# Patient Record
Sex: Female | Born: 1945 | Race: White | Hispanic: No | Smoking: Never smoker
Health system: Southern US, Community
[De-identification: ages and names within clinical notes are randomized; demographics above are authoritative.]

## PROBLEM LIST (undated history)

## (undated) DIAGNOSIS — I209 Angina pectoris, unspecified: Secondary | ICD-10-CM

## (undated) DIAGNOSIS — D332 Benign neoplasm of brain, unspecified: Secondary | ICD-10-CM

## (undated) DIAGNOSIS — R519 Headache, unspecified: Secondary | ICD-10-CM

## (undated) DIAGNOSIS — IMO0002 Reserved for concepts with insufficient information to code with codable children: Secondary | ICD-10-CM

## (undated) DIAGNOSIS — M5136 Other intervertebral disc degeneration, lumbar region: Secondary | ICD-10-CM

## (undated) DIAGNOSIS — M8580 Other specified disorders of bone density and structure, unspecified site: Secondary | ICD-10-CM

## (undated) DIAGNOSIS — I10 Essential (primary) hypertension: Secondary | ICD-10-CM

## (undated) DIAGNOSIS — J45909 Unspecified asthma, uncomplicated: Secondary | ICD-10-CM

## (undated) DIAGNOSIS — Z950 Presence of cardiac pacemaker: Secondary | ICD-10-CM

## (undated) DIAGNOSIS — F32A Depression, unspecified: Secondary | ICD-10-CM

## (undated) DIAGNOSIS — I442 Atrioventricular block, complete: Secondary | ICD-10-CM

## (undated) DIAGNOSIS — I2510 Atherosclerotic heart disease of native coronary artery without angina pectoris: Secondary | ICD-10-CM

## (undated) DIAGNOSIS — R51 Headache: Secondary | ICD-10-CM

## (undated) DIAGNOSIS — E669 Obesity, unspecified: Secondary | ICD-10-CM

## (undated) DIAGNOSIS — G43909 Migraine, unspecified, not intractable, without status migrainosus: Secondary | ICD-10-CM

## (undated) DIAGNOSIS — R011 Cardiac murmur, unspecified: Secondary | ICD-10-CM

## (undated) DIAGNOSIS — M359 Systemic involvement of connective tissue, unspecified: Secondary | ICD-10-CM

## (undated) DIAGNOSIS — M1990 Unspecified osteoarthritis, unspecified site: Secondary | ICD-10-CM

## (undated) DIAGNOSIS — F419 Anxiety disorder, unspecified: Secondary | ICD-10-CM

## (undated) DIAGNOSIS — F329 Major depressive disorder, single episode, unspecified: Secondary | ICD-10-CM

## (undated) DIAGNOSIS — M810 Age-related osteoporosis without current pathological fracture: Secondary | ICD-10-CM

## (undated) DIAGNOSIS — I509 Heart failure, unspecified: Secondary | ICD-10-CM

## (undated) DIAGNOSIS — J189 Pneumonia, unspecified organism: Secondary | ICD-10-CM

## (undated) DIAGNOSIS — K219 Gastro-esophageal reflux disease without esophagitis: Secondary | ICD-10-CM

## (undated) DIAGNOSIS — F4310 Post-traumatic stress disorder, unspecified: Secondary | ICD-10-CM

## (undated) DIAGNOSIS — M51369 Other intervertebral disc degeneration, lumbar region without mention of lumbar back pain or lower extremity pain: Secondary | ICD-10-CM

## (undated) DIAGNOSIS — E7800 Pure hypercholesterolemia, unspecified: Secondary | ICD-10-CM

## (undated) DIAGNOSIS — J449 Chronic obstructive pulmonary disease, unspecified: Secondary | ICD-10-CM

## (undated) HISTORY — DX: Post-traumatic stress disorder, unspecified: F43.10

## (undated) HISTORY — PX: JOINT REPLACEMENT: SHX530

## (undated) HISTORY — PX: CHOLECYSTECTOMY: SHX55

## (undated) HISTORY — DX: Atrioventricular block, complete: I44.2

## (undated) HISTORY — PX: ABDOMINAL HYSTERECTOMY: SHX81

## (undated) HISTORY — DX: Presence of cardiac pacemaker: Z95.0

## (undated) HISTORY — PX: FOOT ARTHROPLASTY: SHX1657

## (undated) HISTORY — PX: CERVICAL FUSION: SHX112

---

## 2000-05-21 ENCOUNTER — Encounter: Payer: Self-pay | Admitting: Family Medicine

## 2000-05-21 ENCOUNTER — Encounter: Admission: RE | Admit: 2000-05-21 | Discharge: 2000-05-21 | Payer: Self-pay | Admitting: Family Medicine

## 2004-03-16 ENCOUNTER — Other Ambulatory Visit: Payer: Self-pay

## 2004-08-13 ENCOUNTER — Inpatient Hospital Stay: Payer: Self-pay | Admitting: Internal Medicine

## 2004-08-26 ENCOUNTER — Ambulatory Visit: Payer: Self-pay | Admitting: *Deleted

## 2004-09-20 ENCOUNTER — Ambulatory Visit: Payer: Self-pay | Admitting: Internal Medicine

## 2004-10-17 ENCOUNTER — Emergency Department: Payer: Self-pay | Admitting: Emergency Medicine

## 2005-01-12 ENCOUNTER — Emergency Department: Payer: Self-pay | Admitting: Emergency Medicine

## 2005-02-27 ENCOUNTER — Emergency Department: Payer: Self-pay | Admitting: Emergency Medicine

## 2005-05-01 ENCOUNTER — Emergency Department: Payer: Self-pay | Admitting: Emergency Medicine

## 2005-05-01 ENCOUNTER — Other Ambulatory Visit: Payer: Self-pay

## 2005-05-09 ENCOUNTER — Emergency Department: Payer: Self-pay | Admitting: Emergency Medicine

## 2005-05-09 ENCOUNTER — Other Ambulatory Visit: Payer: Self-pay

## 2005-05-18 ENCOUNTER — Ambulatory Visit: Payer: Self-pay | Admitting: Cardiovascular Disease

## 2005-05-22 ENCOUNTER — Ambulatory Visit: Payer: Self-pay | Admitting: Cardiovascular Disease

## 2005-07-16 ENCOUNTER — Ambulatory Visit: Payer: Self-pay

## 2005-09-02 ENCOUNTER — Emergency Department: Payer: Self-pay | Admitting: Emergency Medicine

## 2005-09-02 ENCOUNTER — Other Ambulatory Visit: Payer: Self-pay

## 2005-09-21 ENCOUNTER — Emergency Department: Payer: Self-pay | Admitting: General Practice

## 2005-11-10 ENCOUNTER — Ambulatory Visit: Payer: Self-pay

## 2005-12-18 ENCOUNTER — Emergency Department: Payer: Self-pay | Admitting: Emergency Medicine

## 2006-01-20 ENCOUNTER — Other Ambulatory Visit: Payer: Self-pay

## 2006-01-22 ENCOUNTER — Inpatient Hospital Stay: Payer: Self-pay

## 2006-02-17 ENCOUNTER — Inpatient Hospital Stay (HOSPITAL_COMMUNITY): Admission: RE | Admit: 2006-02-17 | Discharge: 2006-02-25 | Payer: Self-pay | Admitting: Neurosurgery

## 2006-02-18 ENCOUNTER — Encounter (INDEPENDENT_AMBULATORY_CARE_PROVIDER_SITE_OTHER): Payer: Self-pay | Admitting: *Deleted

## 2006-04-29 ENCOUNTER — Ambulatory Visit: Payer: Self-pay | Admitting: Neurosurgery

## 2006-06-21 ENCOUNTER — Emergency Department: Payer: Self-pay | Admitting: Emergency Medicine

## 2006-06-21 ENCOUNTER — Other Ambulatory Visit: Payer: Self-pay

## 2006-06-29 ENCOUNTER — Ambulatory Visit: Payer: Self-pay | Admitting: Internal Medicine

## 2006-06-30 ENCOUNTER — Ambulatory Visit: Payer: Self-pay | Admitting: Neurosurgery

## 2006-07-28 ENCOUNTER — Ambulatory Visit: Payer: Self-pay | Admitting: Family Medicine

## 2006-09-07 ENCOUNTER — Ambulatory Visit: Payer: Self-pay | Admitting: Pain Medicine

## 2006-09-25 IMAGING — CR CERVICAL SPINE - 2-3 VIEW
1 series · 5 of 5 positions shown · non-contrast
Comparison: none

REASON FOR EXAM: Injury
COMMENTS:  LMP: Post-Menopausal

[Series 1: view not recorded · 0.17mm/px · 5 of 5 slices shown]
[im 1/5]
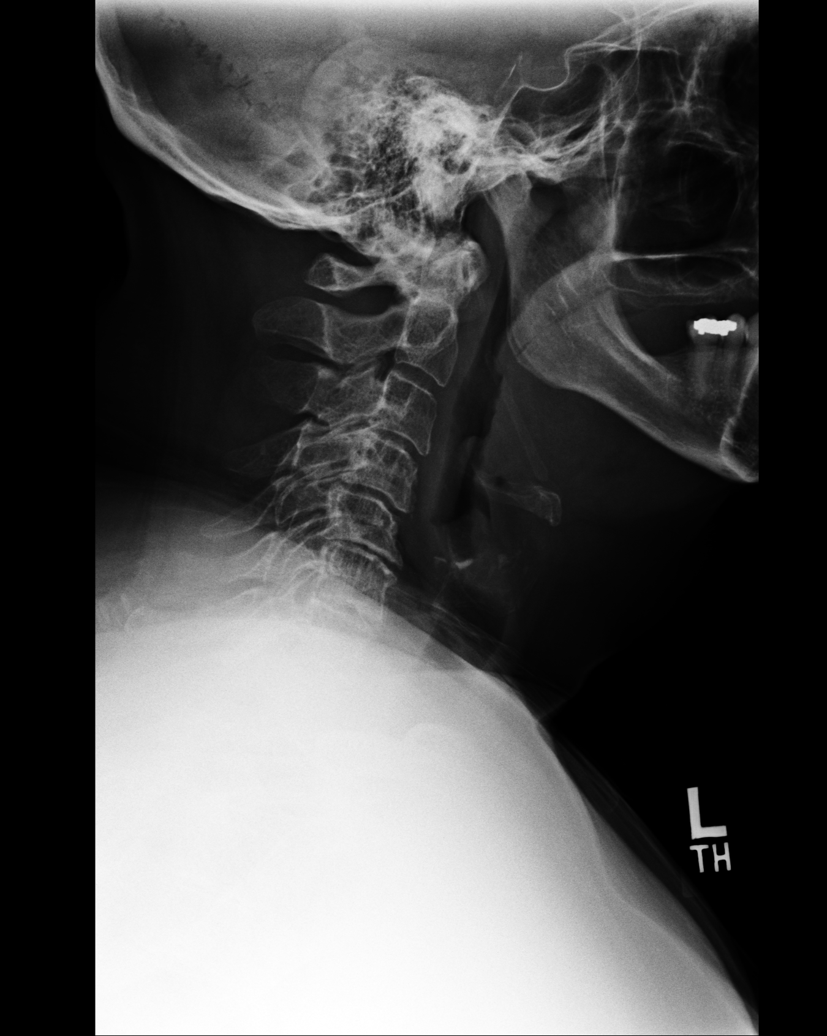
[im 2/5]
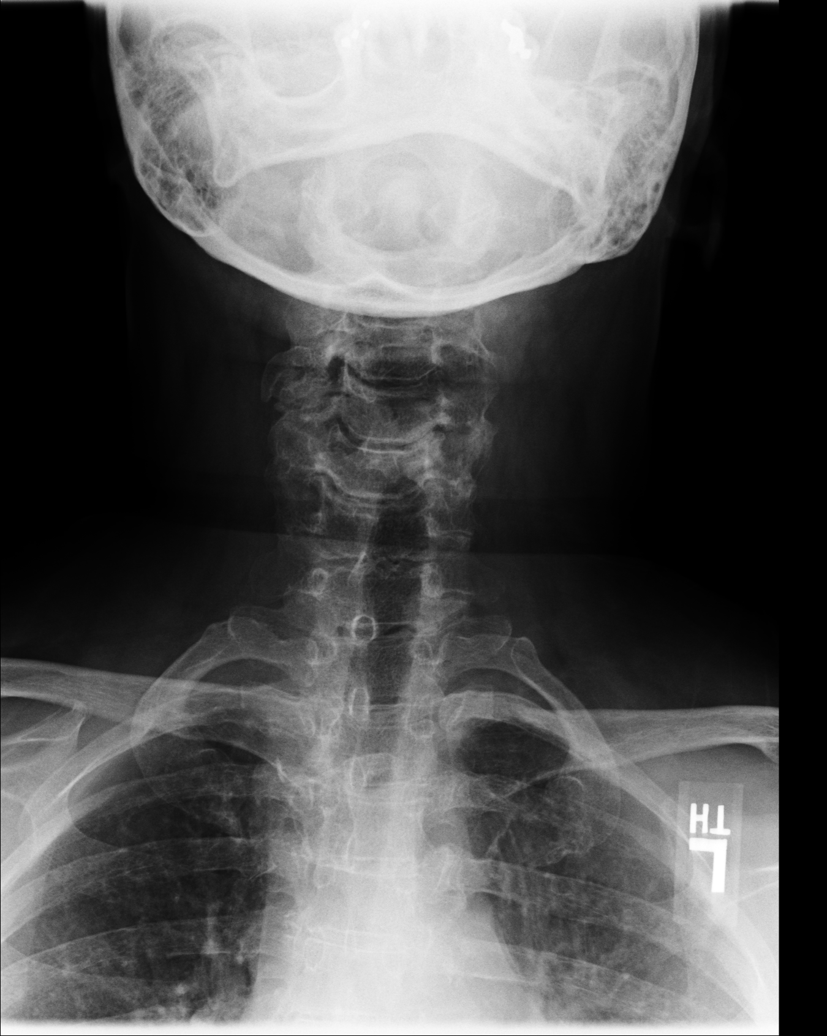
[im 3/5]
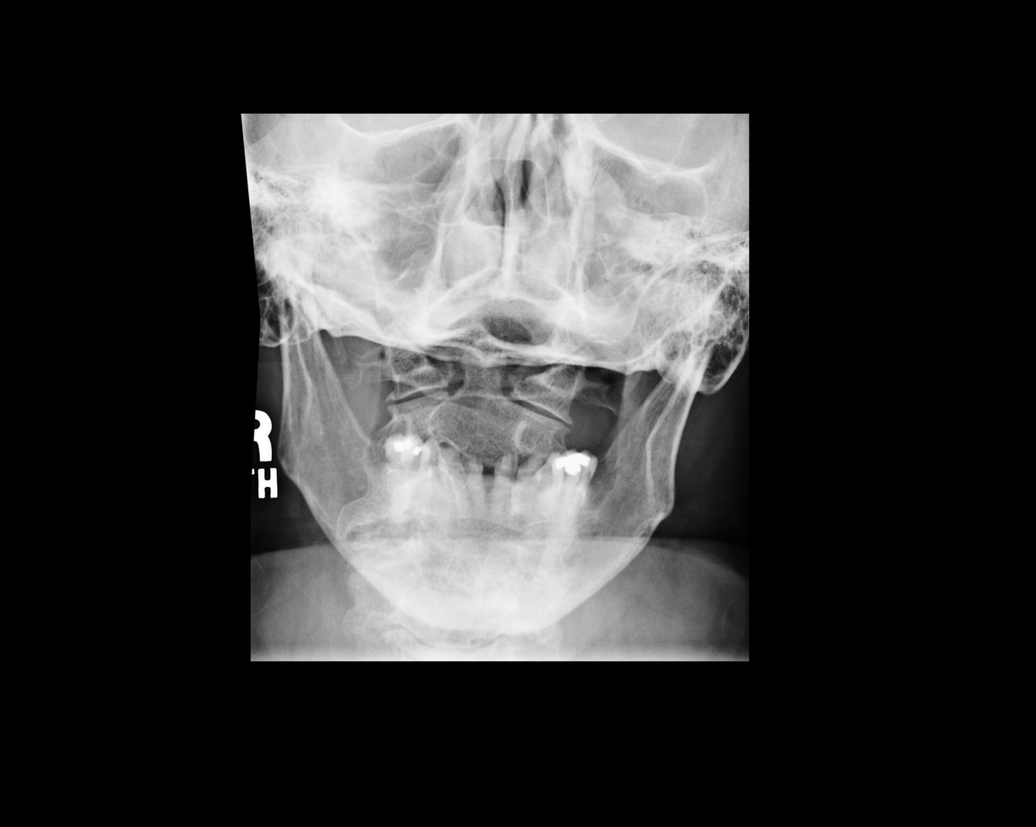
[im 4/5]
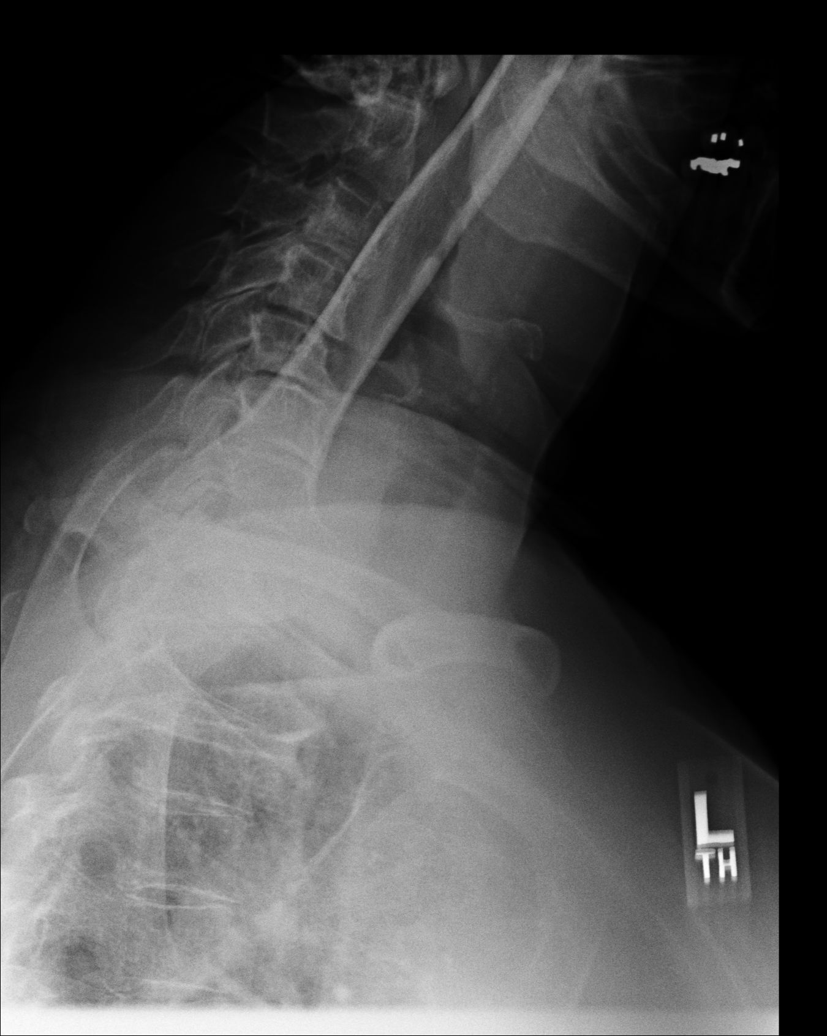
[im 5/5]
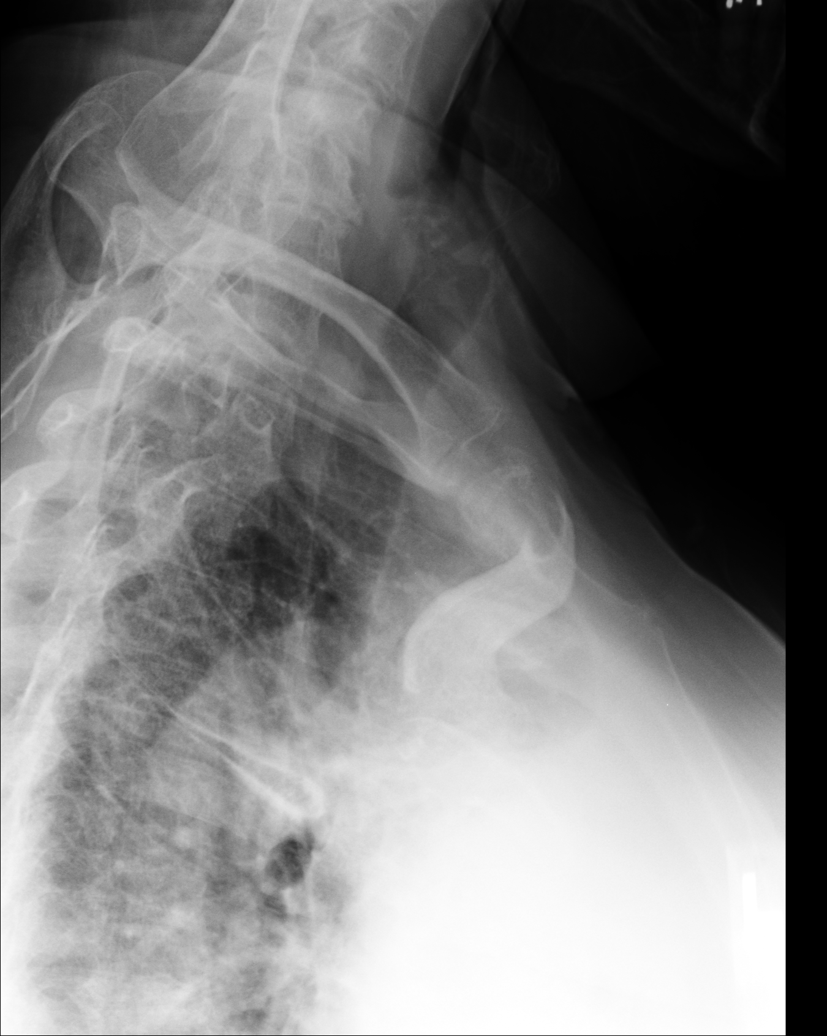

[5 of 5 positions shown; findings below may reference images not displayed]

PROCEDURE:     DXR - DXR C- SPINE AP AND LATERAL  - January 12, 2005  [DATE]

RESULT:     AP and lateral views of the cervical spine show the vertebral
body heights to be well maintained. No fracture is seen. The vertebral body
alignment is normal. There is narrowing of the C4-5, C5-6 and C6-7 cervical
disc spaces compatible with cervical disc disease. The odontoid process is
intact. No cervical rib formation is seen.
IMPRESSION: 1.     No fracture is noted.
2.     There are changes of cervical disc disease at multiple levels of the
lower cervical spine.

## 2006-10-06 ENCOUNTER — Ambulatory Visit: Payer: Self-pay | Admitting: Pain Medicine

## 2006-10-21 ENCOUNTER — Other Ambulatory Visit: Payer: Self-pay

## 2006-10-21 ENCOUNTER — Ambulatory Visit: Payer: Self-pay | Admitting: Specialist

## 2006-10-27 ENCOUNTER — Ambulatory Visit: Payer: Self-pay | Admitting: Pain Medicine

## 2006-11-02 ENCOUNTER — Ambulatory Visit: Payer: Self-pay | Admitting: Specialist

## 2006-11-10 IMAGING — US US EXTREM UP VENOUS*L*
1 series · 17 of 24 positions shown · non-contrast
Comparison: none

REASON FOR EXAM: Pain/rule out deep venous thrombosis
COMMENTS:

PROCEDURE:     US  - US DOPPLER UP EXTR LEFT  - February 27, 2005  [DATE]
RESULT:     LEFT lower extremity color-flow duplex Doppler reveals a small
popliteal fossa cyst consistent with Baker cyst measuring approximately
cm.  There is no evidence of deep venous thrombosis.

[Series 1: us extrem up venous*left* · 17 of 40 slices shown]
[im 1/40]
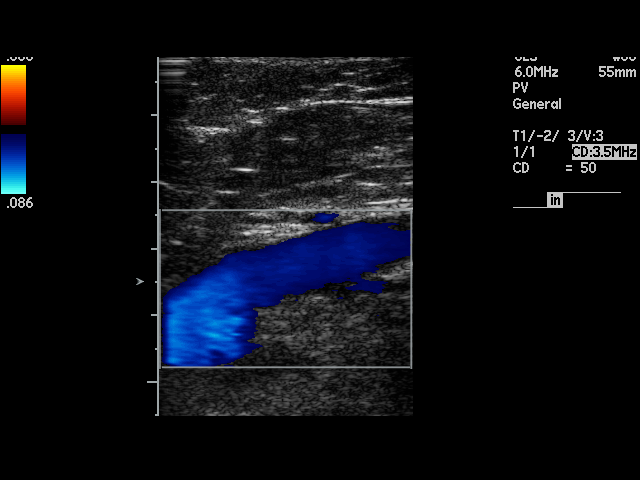
[im 4/40]
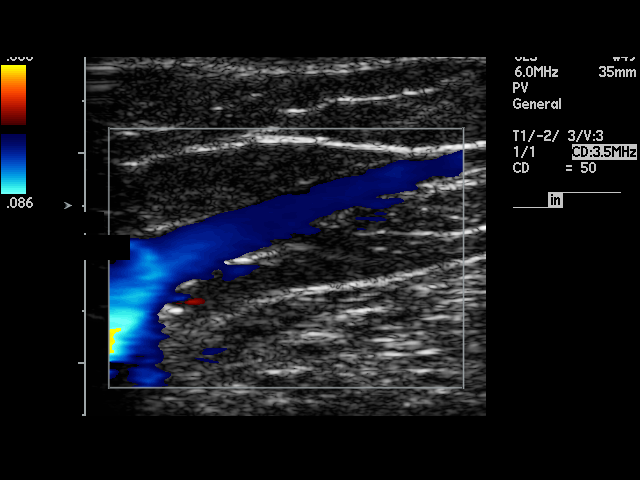
[im 6/40]
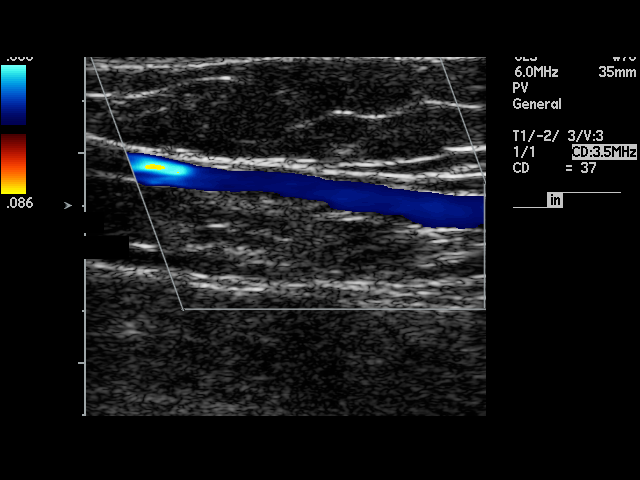
[im 7/40]
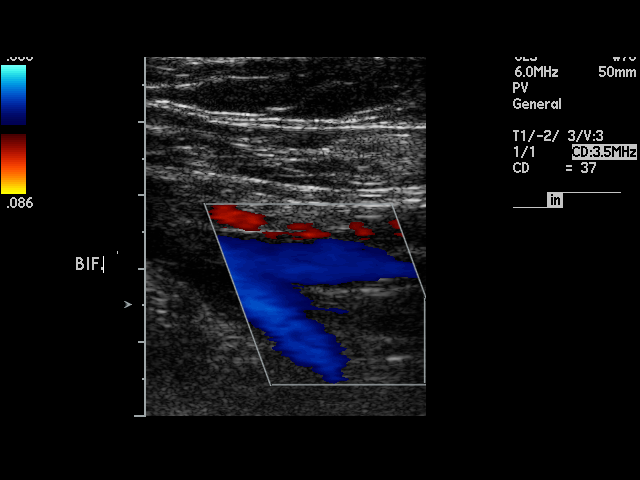
[im 11/40]
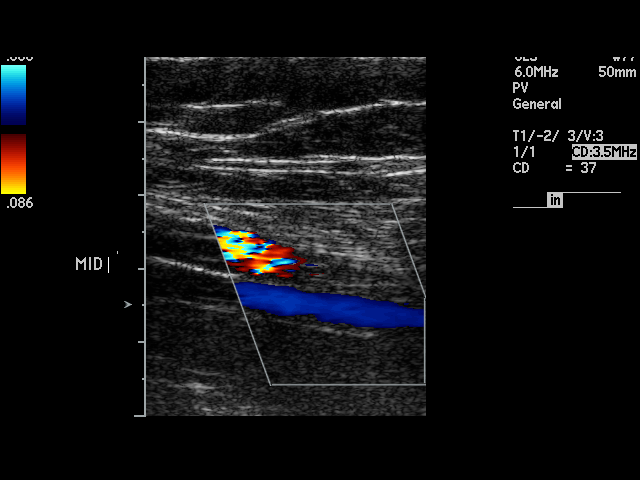
[im 12/40]
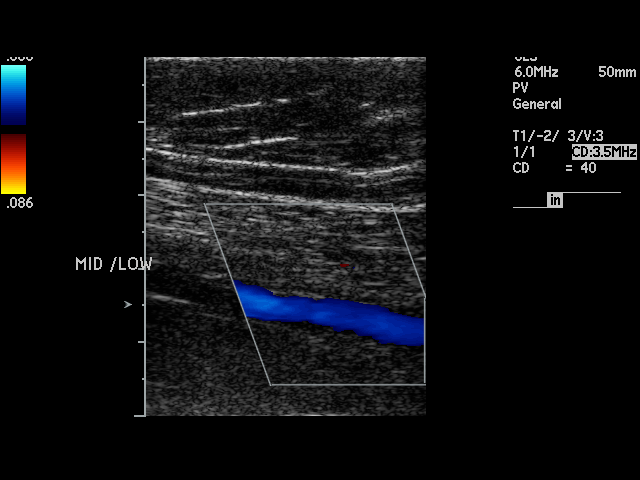
[im 16/40]
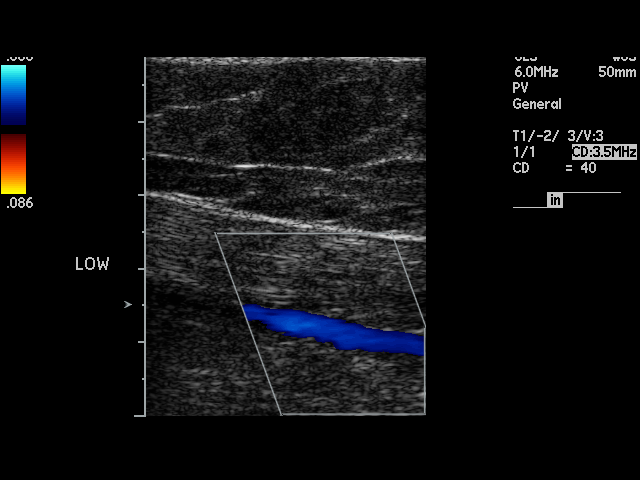
[im 17/40]
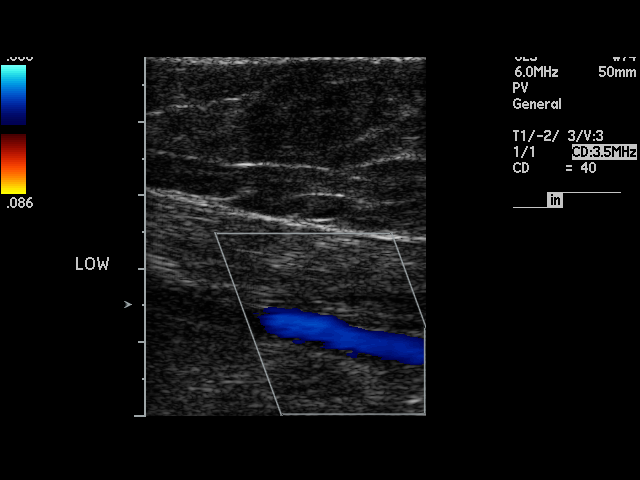
[im 21/40]
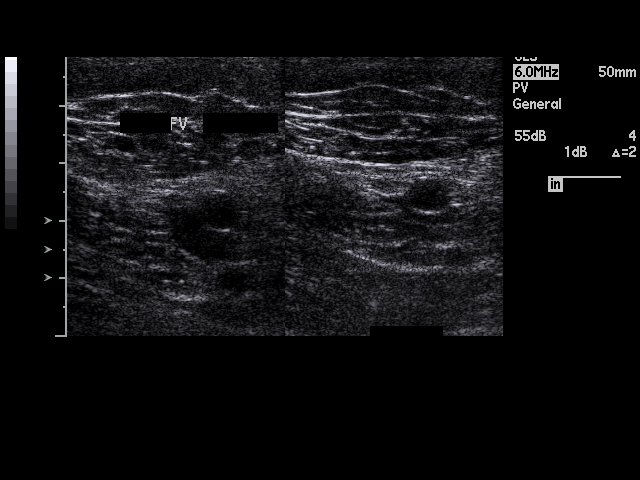
[im 23/40]
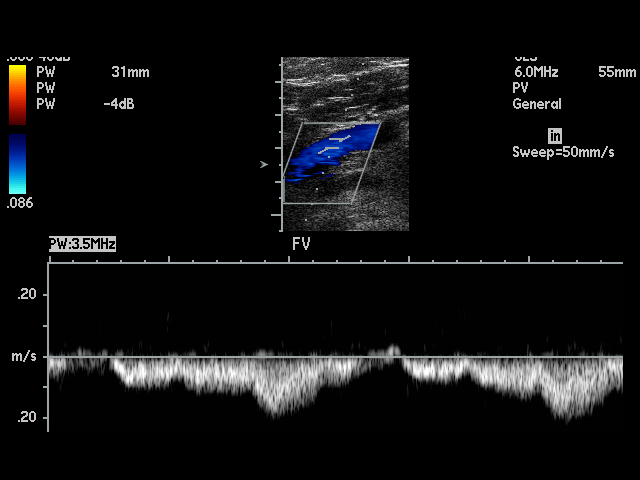
[im 24/40]
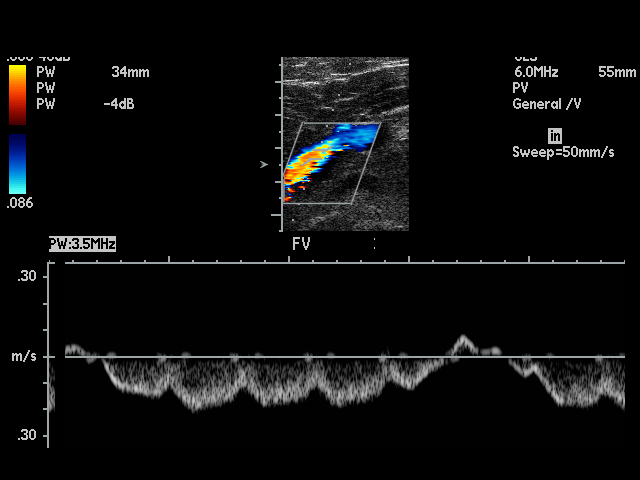
[im 28/40]
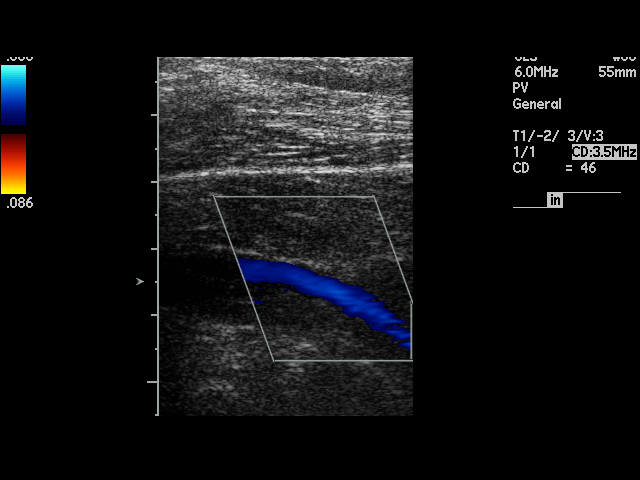
[im 29/40]
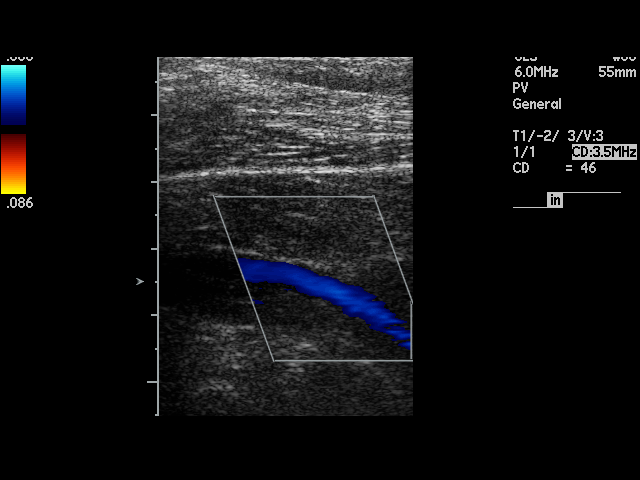
[im 33/40]
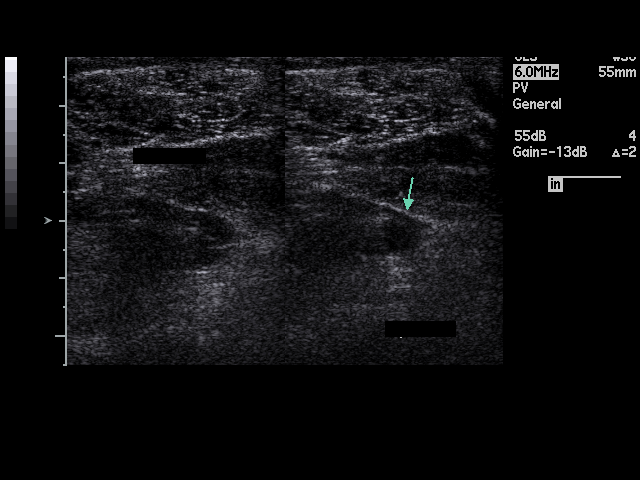
[im 34/40]
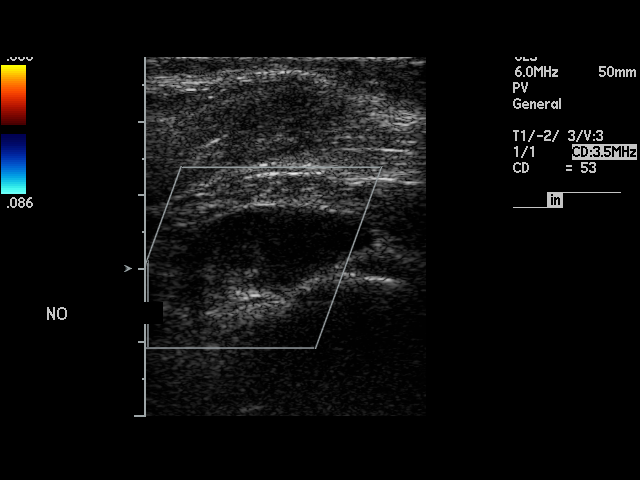
[im 36/40]
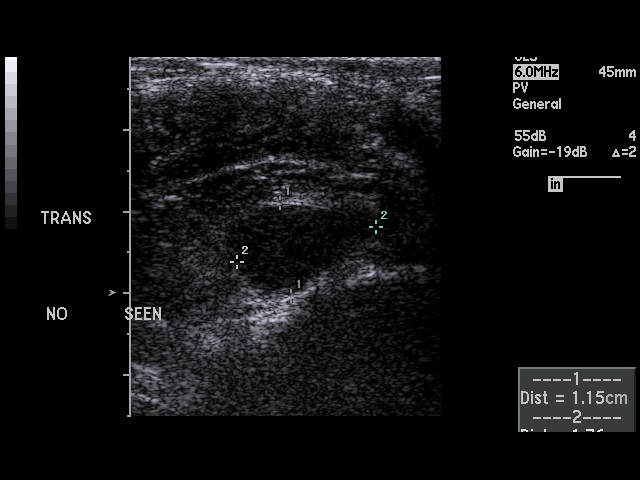
[im 40/40]
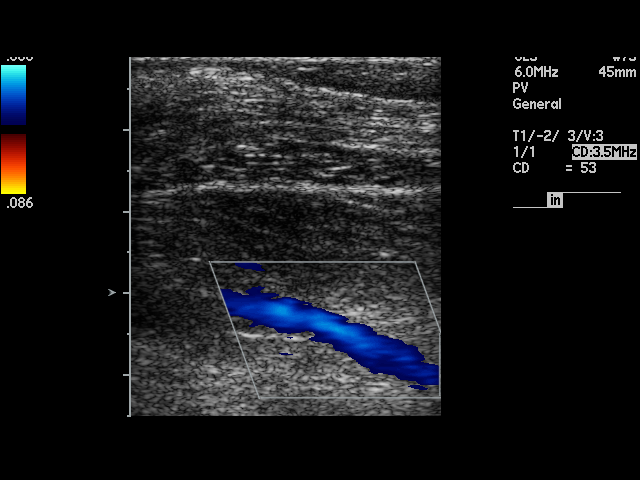

[17 of 24 positions shown; findings below may reference images not displayed]

IMPRESSION: 1.     A small Baker cyst, no evidence of deep venous thrombosis.
2.     The initial report was faxed to the emergency room at the time of the
study.

## 2006-11-17 ENCOUNTER — Emergency Department: Payer: Self-pay | Admitting: Emergency Medicine

## 2006-12-07 ENCOUNTER — Ambulatory Visit: Payer: Self-pay | Admitting: Pain Medicine

## 2006-12-13 ENCOUNTER — Ambulatory Visit: Payer: Self-pay | Admitting: Pain Medicine

## 2007-01-05 ENCOUNTER — Ambulatory Visit: Payer: Self-pay | Admitting: Pain Medicine

## 2007-01-12 IMAGING — CR DG CHEST 2V
1 series · 2 of 2 positions shown · non-contrast
Comparison: none

REASON FOR EXAM: Weakness
COMMENTS:

[Series 1481: postero_anterior · 0.11mm/px · 2 of 2 slices shown]
[im 1/2]
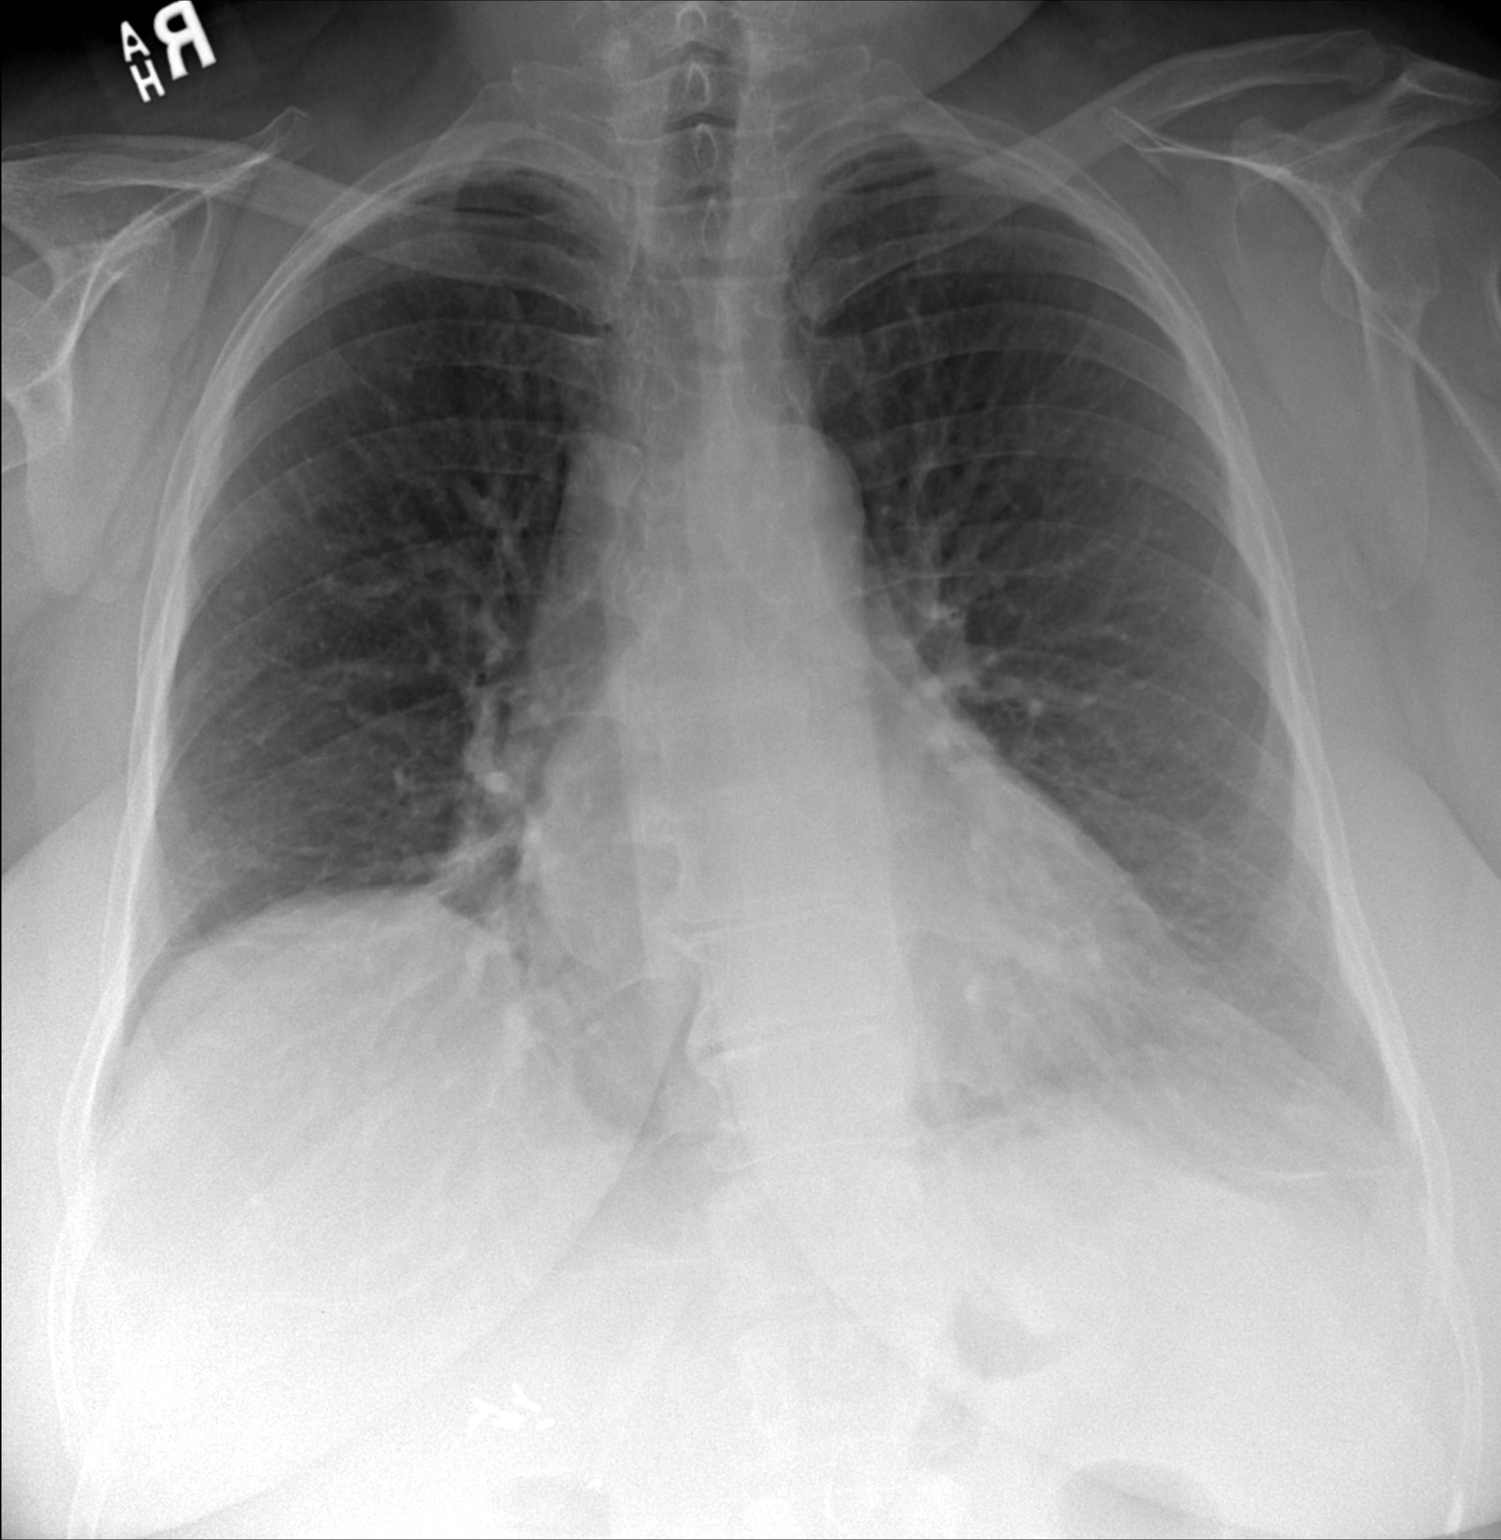
[im 2/2]
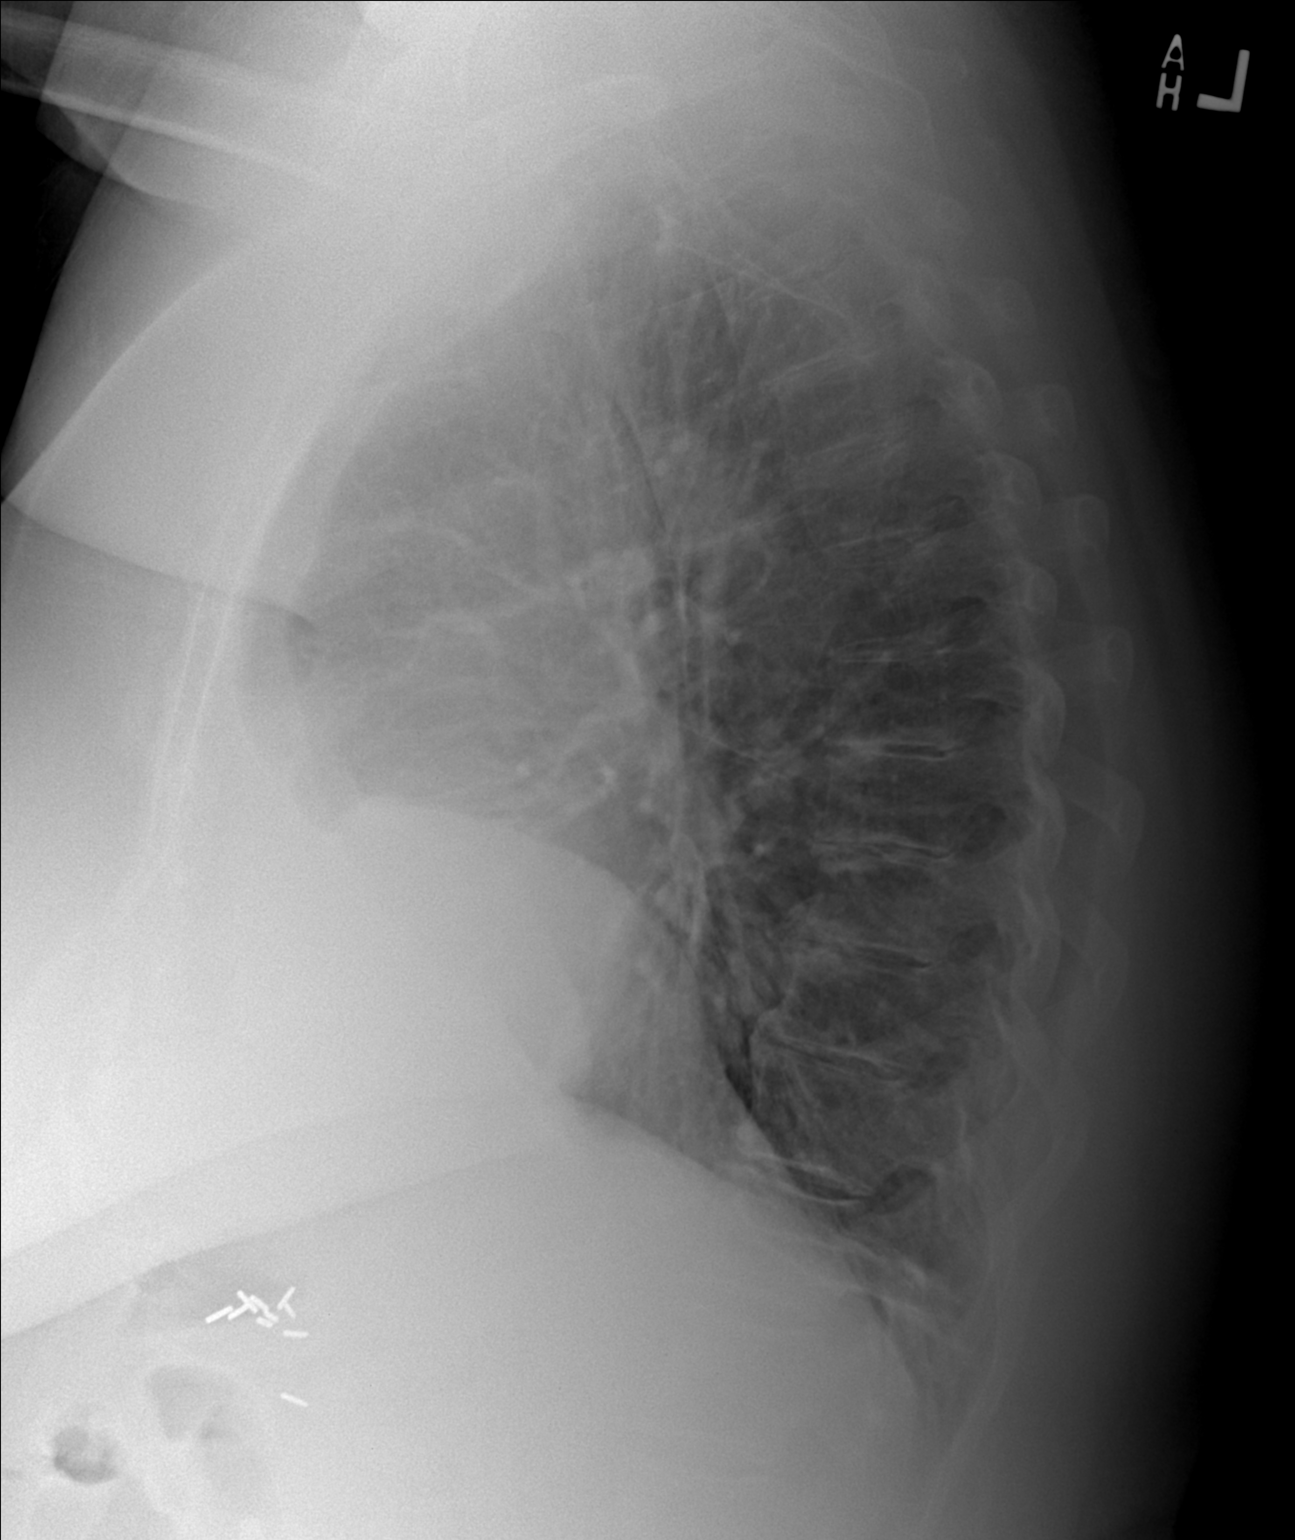

[2 of 2 positions shown; findings below may reference images not displayed]

PROCEDURE:     DXR - DXR CHEST PA (OR AP) AND LATERAL  - May 01, 2005  [DATE]

RESULT:     Comparison is made to a portable chest x-ray 13 August, 2004.

The LEFT lung is mildly hyperinflated.  Minimal atelectasis is noted in the
lung base.  On the RIGHT, the hemidiaphragm is elevated.  This is a stable
finding.  I see no focal infiltrate.  The heart is top normal in size.  I
see no pleural effusion.  There is mild prominence of the central pulmonary
vascularity.
IMPRESSION: There is mild prominence of the pulmonary vascularity
which may reflect an element of underlying low-grade congestive heart
failure.  This is superimposed upon findings of COPD.  I see no pleural
effusion currently.  Follow up films would be of value if the patient's
symptoms persist.

## 2007-01-20 ENCOUNTER — Ambulatory Visit: Payer: Self-pay | Admitting: Pain Medicine

## 2007-01-26 ENCOUNTER — Ambulatory Visit: Payer: Self-pay | Admitting: Pain Medicine

## 2007-02-09 ENCOUNTER — Ambulatory Visit: Payer: Self-pay | Admitting: Pain Medicine

## 2007-02-21 ENCOUNTER — Ambulatory Visit: Payer: Self-pay | Admitting: Pain Medicine

## 2007-02-28 ENCOUNTER — Ambulatory Visit: Payer: Self-pay | Admitting: Pain Medicine

## 2007-03-03 ENCOUNTER — Ambulatory Visit: Payer: Self-pay | Admitting: Internal Medicine

## 2007-03-23 ENCOUNTER — Ambulatory Visit: Payer: Self-pay | Admitting: Family Medicine

## 2007-03-29 ENCOUNTER — Ambulatory Visit: Payer: Self-pay | Admitting: Pain Medicine

## 2007-03-29 IMAGING — CT CT PARANASAL SINUSES W/O CM
1 series · 16 of 24 positions shown, 20 images · non-contrast
Comparison: none

REASON FOR EXAM: sinusitis
COMMENTS:

[Series 2: sinus · axial · 0.46mm/px · z∈[+286,+391]mm · 16 of 24 slices shown, 20 images]
[im 2/24  brain]
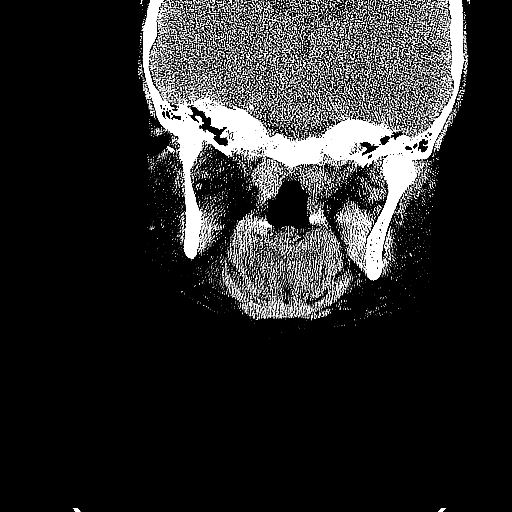
[im 2/24  bone]
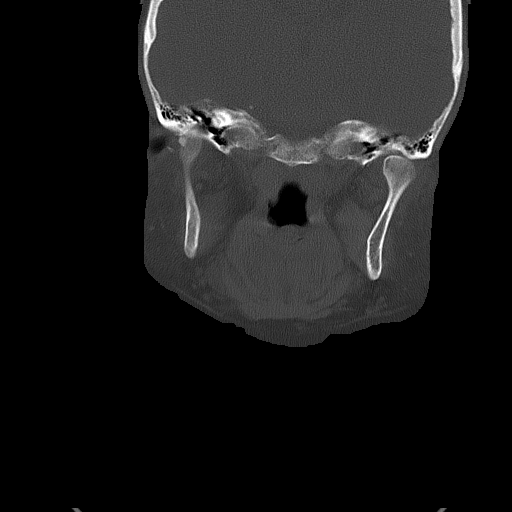
[im 4/24  bone]
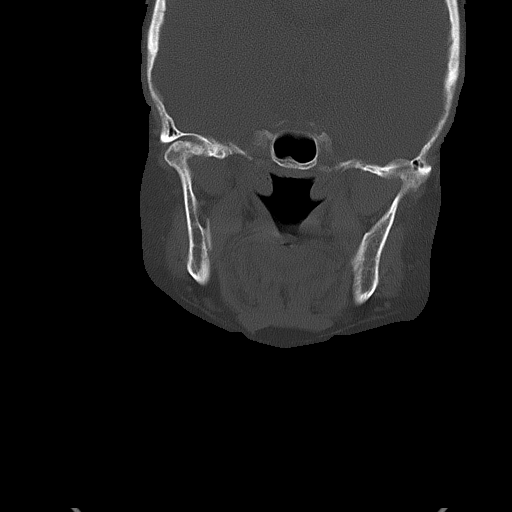
[im 5/24  bone]
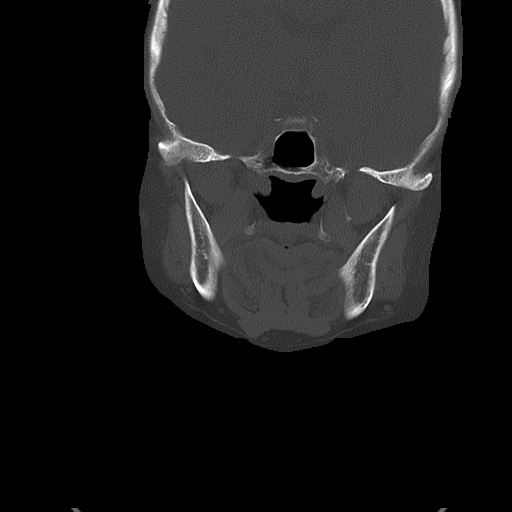
[im 6/24  bone]
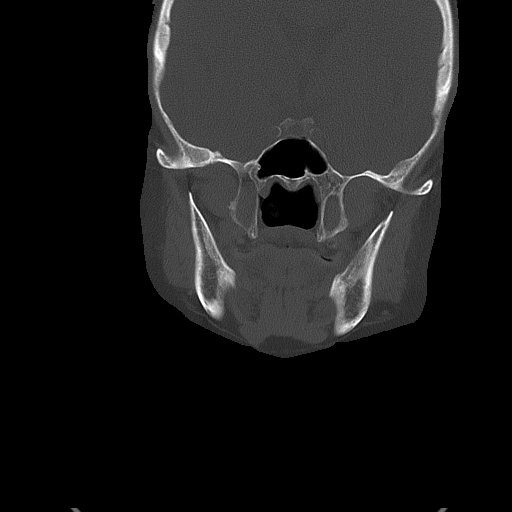
[im 8/24  brain]
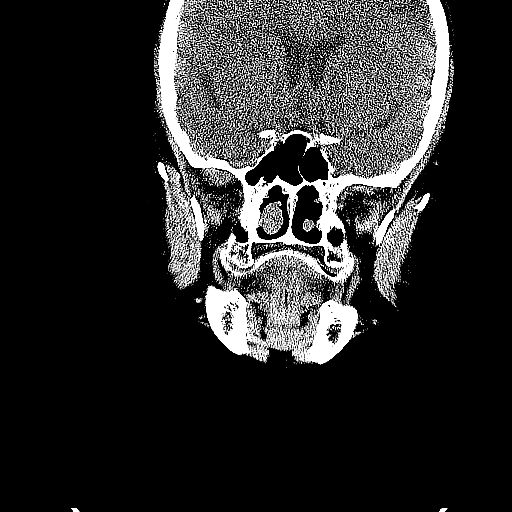
[im 8/24  bone]
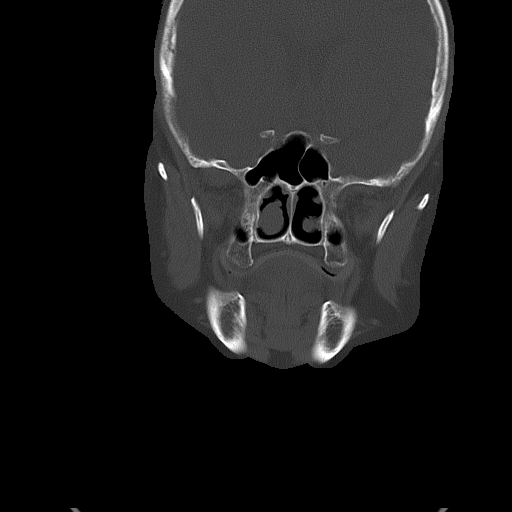
[im 9/24  bone]
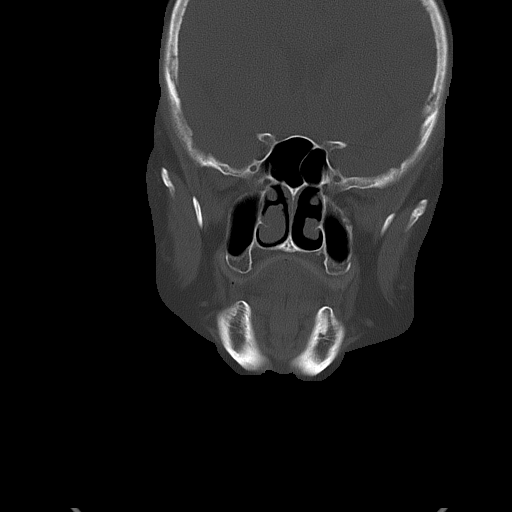
[im 10/24  bone]
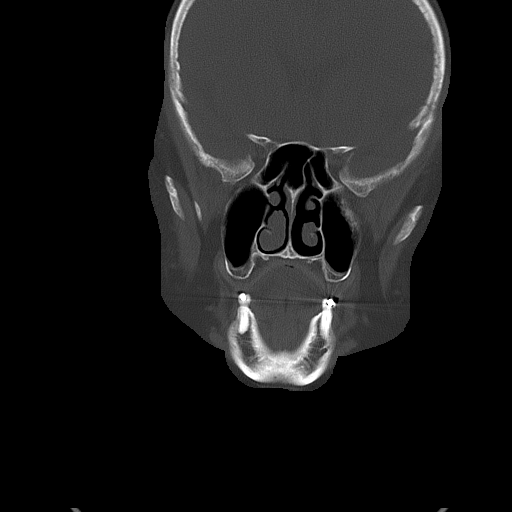
[im 12/24  bone]
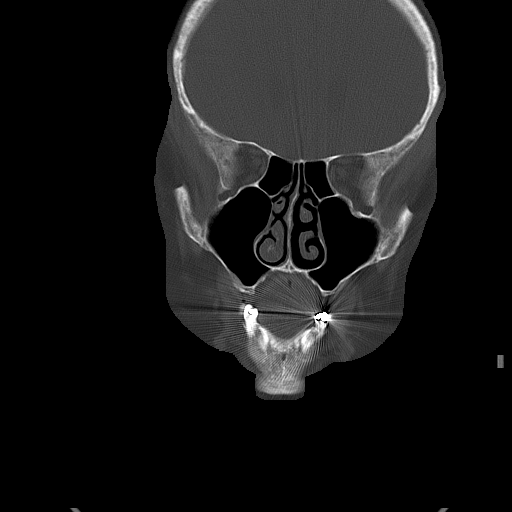
[im 13/24  brain]
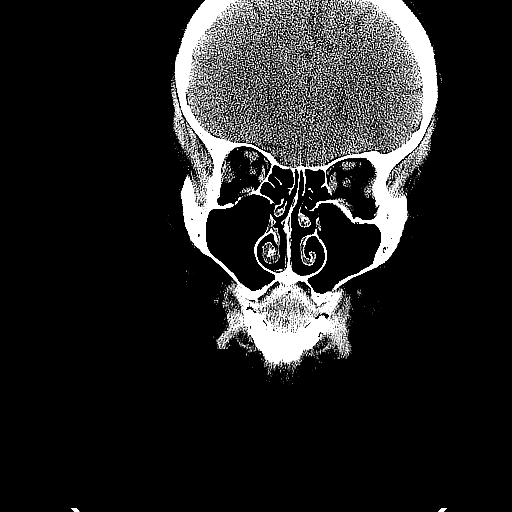
[im 13/24  bone]
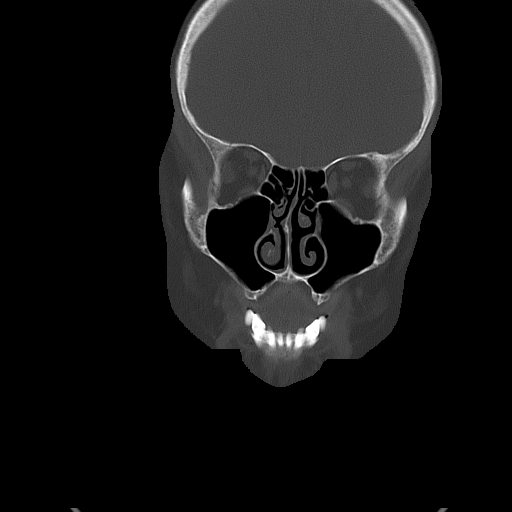
[im 15/24  bone]
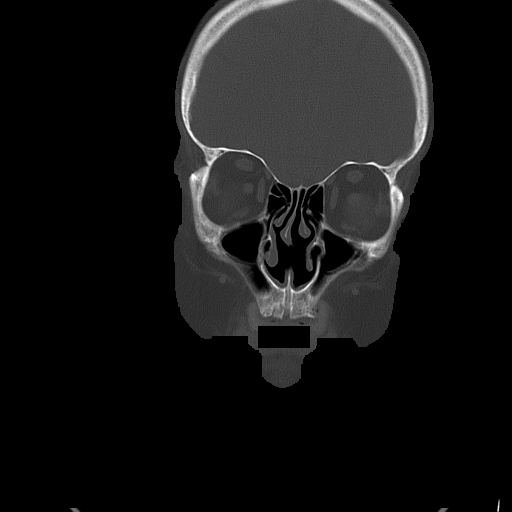
[im 16/24  bone]
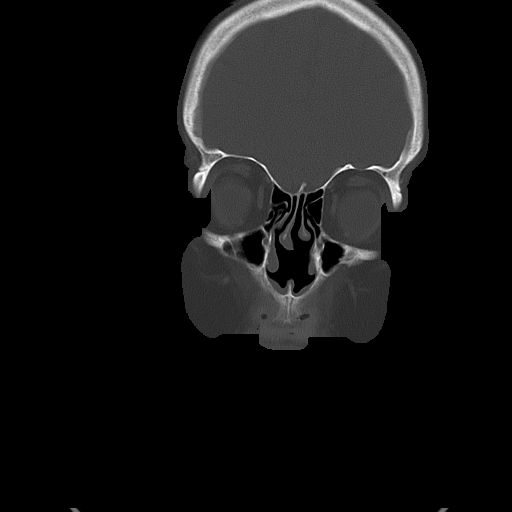
[im 17/24  bone]
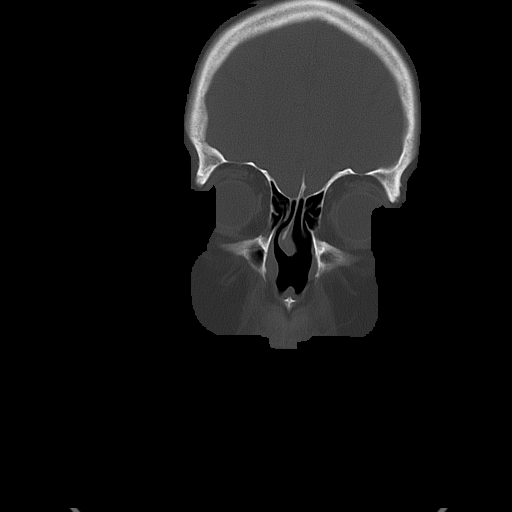
[im 19/24  brain]
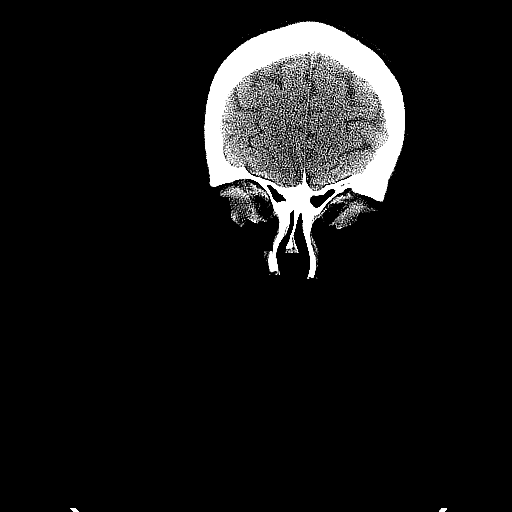
[im 19/24  bone]
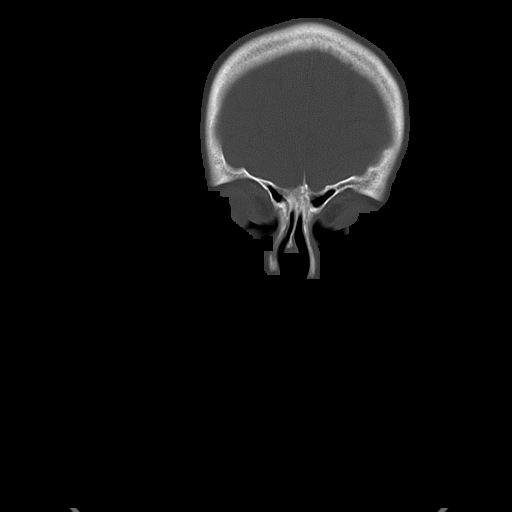
[im 20/24  bone]
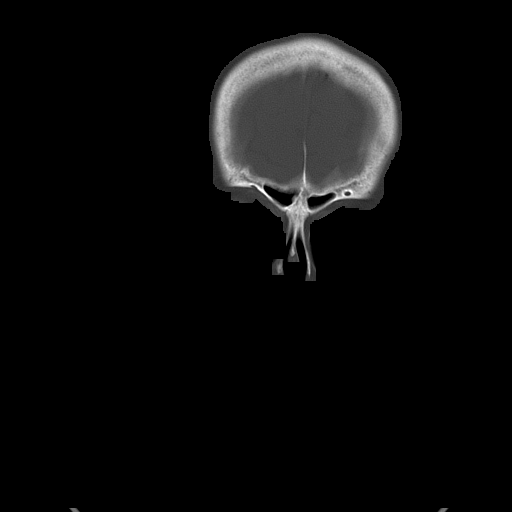
[im 21/24  bone]
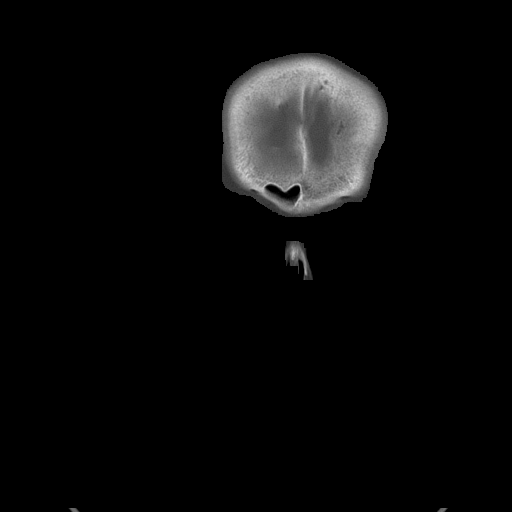
[im 23/24  bone]
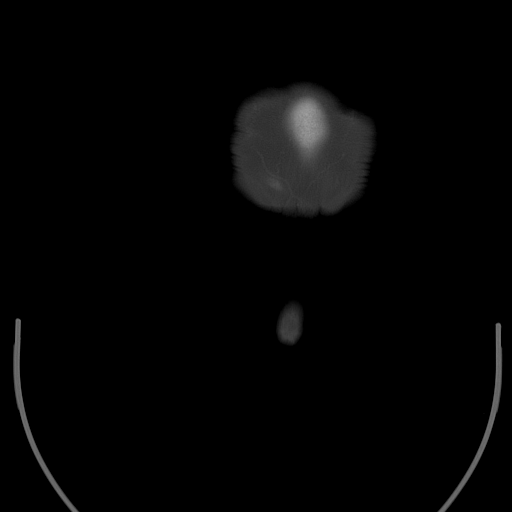

[16 of 24 positions shown; findings below may reference images not displayed]

PROCEDURE:     CT  - CT SINUSES WITHOUT CONTRAST  - July 16, 2005 [DATE]

RESULT:     Direct coronal CT imaging of the paranasal sinuses is performed.
The patient has no prior study for comparison.  The frontal sinuses are
hypoplastic but there is no evidence of mucosal thickening or air fluid
level.  The ethmoid, maxillary and sphenoid sinuses appear to be normally
aerated. No air fluid levels are seen.  The nasal septum approximates
midline, but curves slightly to the RIGHT.  Anteriorly, there is a cleft in
the nasal septum.  Does the patient report previous surgery? The ostium on
the RIGHT appears to be slightly narrowed but patent. The ostium on the LEFT
appears to be occluded by some mucosal thickening. No bony destruction is
seen.
IMPRESSION: 1)Please see above.

## 2007-04-04 ENCOUNTER — Ambulatory Visit: Payer: Self-pay | Admitting: Pain Medicine

## 2007-05-03 ENCOUNTER — Ambulatory Visit: Payer: Self-pay | Admitting: Pain Medicine

## 2007-05-10 ENCOUNTER — Other Ambulatory Visit: Payer: Self-pay

## 2007-05-10 ENCOUNTER — Emergency Department: Payer: Self-pay | Admitting: Emergency Medicine

## 2007-05-16 IMAGING — NM NM LUNG SCAN
2 series · 16 of 16 positions shown · non-contrast
Comparison: none

REASON FOR EXAM: Positive D-Dimer
COMMENTS:

[Series 1: lung ventilation · 3.90mm/px · 4 acquisitions, 8 frames shown]
[im 1/4]
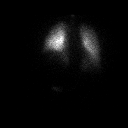
[im 1/4]
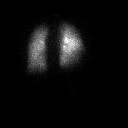
[im 2/4]
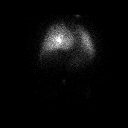
[im 2/4]
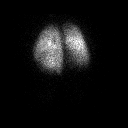
[im 3/4]
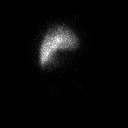
[im 3/4]
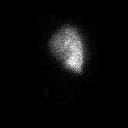
[im 4/4]
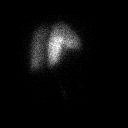
[im 4/4]
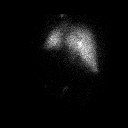

[Series 1: lung perfusion · 1.95mm/px · 4 acquisitions, 8 frames shown]
[im 1/4]
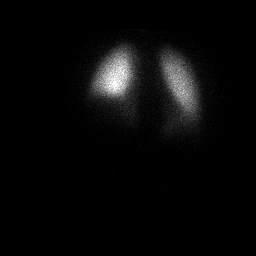
[im 1/4]
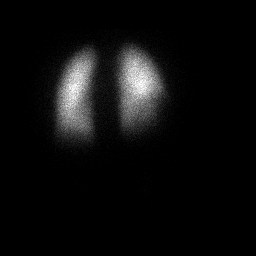
[im 2/4]
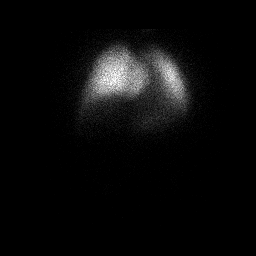
[im 2/4]
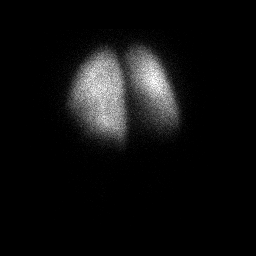
[im 3/4]
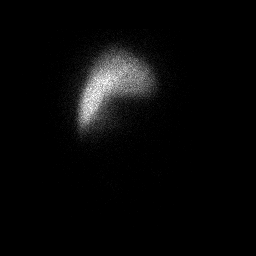
[im 3/4]
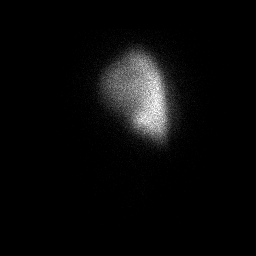
[im 4/4]
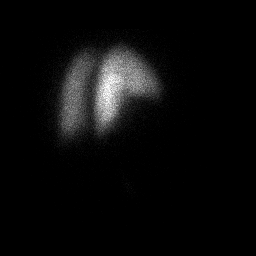
[im 4/4]
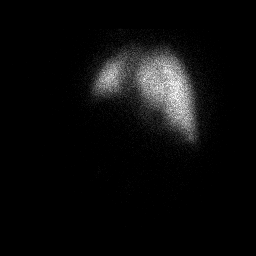

[16 of 16 positions shown; findings below may reference images not displayed]

PROCEDURE:     NM  - NM VQ LUNG SCAN  - [DATE] [DATE] [DATE] [DATE]

RESULT:     Emergent exam was performed for positive D-Dimer.

The scan was originally read by the [HOSPITAL].

The ventilation scan was first performed and no defects are identified.
Perfusion scan was then performed and no mismatched defects are seen. There
is fairly uniform distribution of tracer throughout the lung fields.
IMPRESSION: Low probability for pulmonary embolus. No mismatched defects
are noted.

## 2007-05-17 IMAGING — CR DG CHEST 2V
1 series · 2 of 2 positions shown · non-contrast
Comparison: none

REASON FOR EXAM: Positive D-Dimer
COMMENTS:

[Series 1: view not recorded · 0.17mm/px · 2 of 2 slices shown]
[im 1/2]
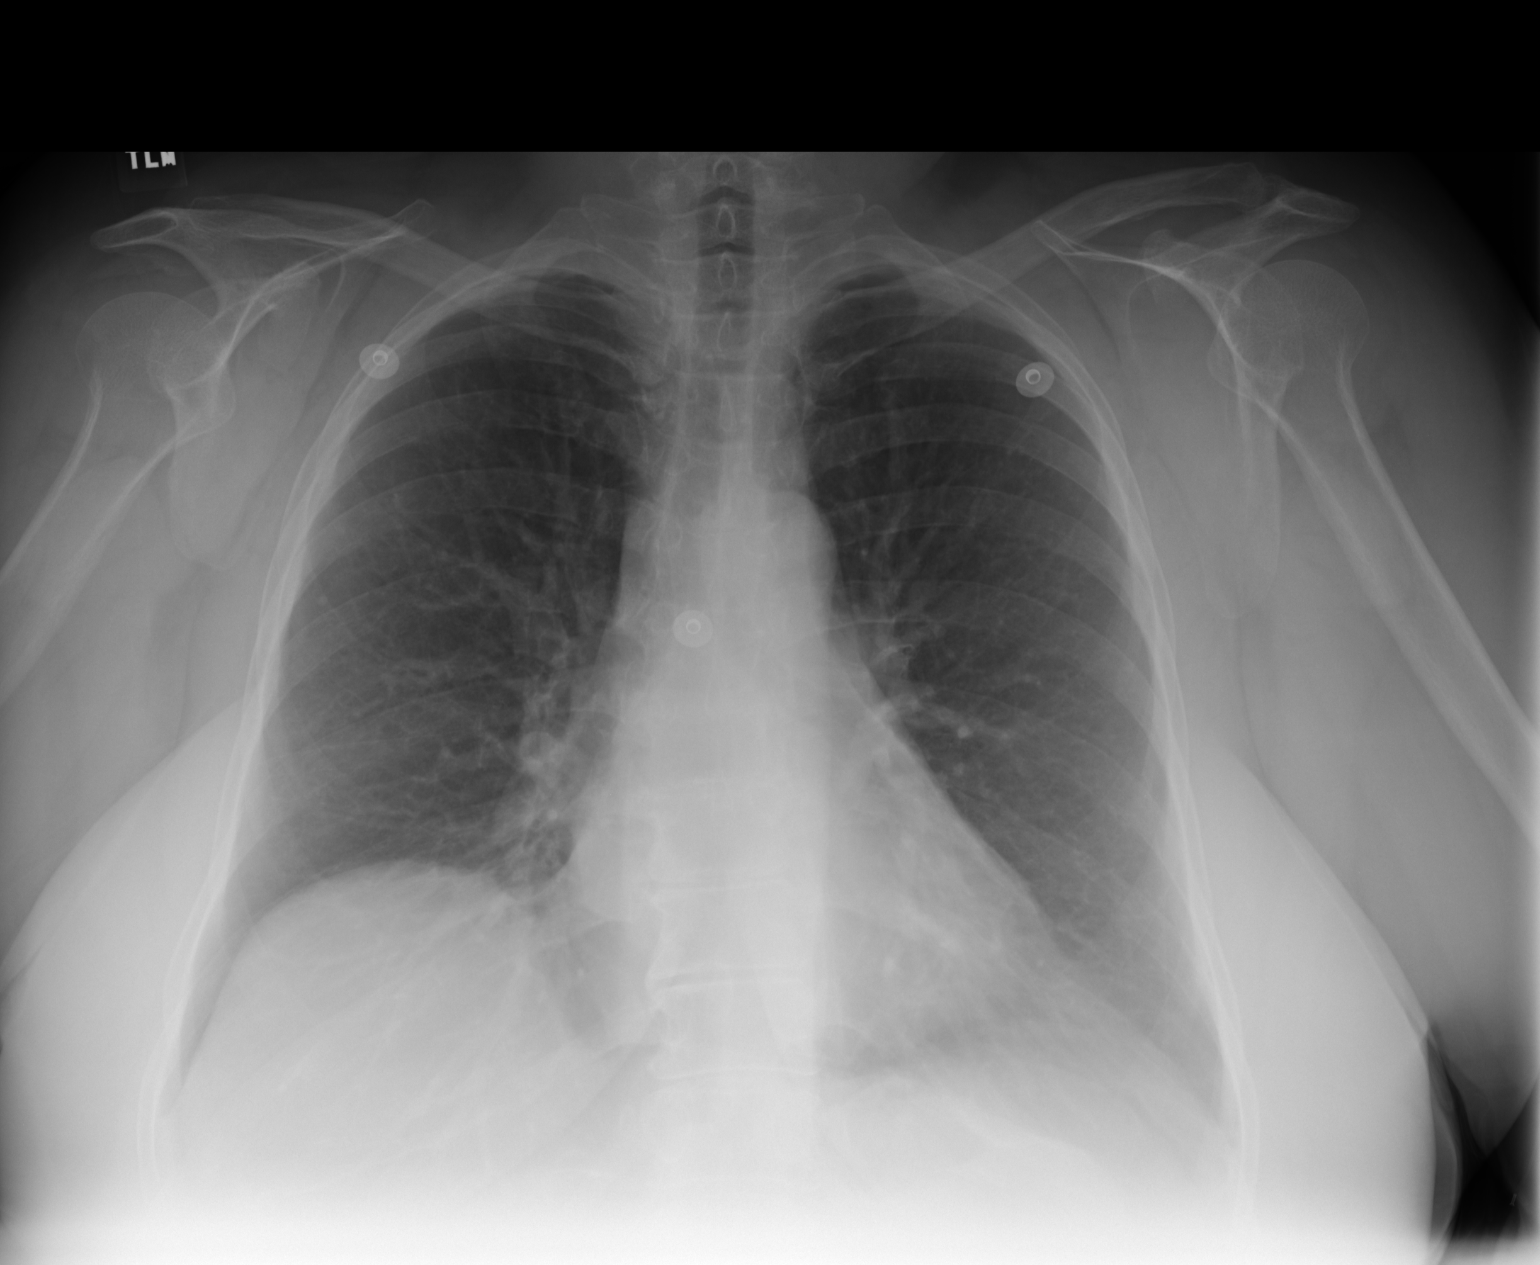
[im 2/2]
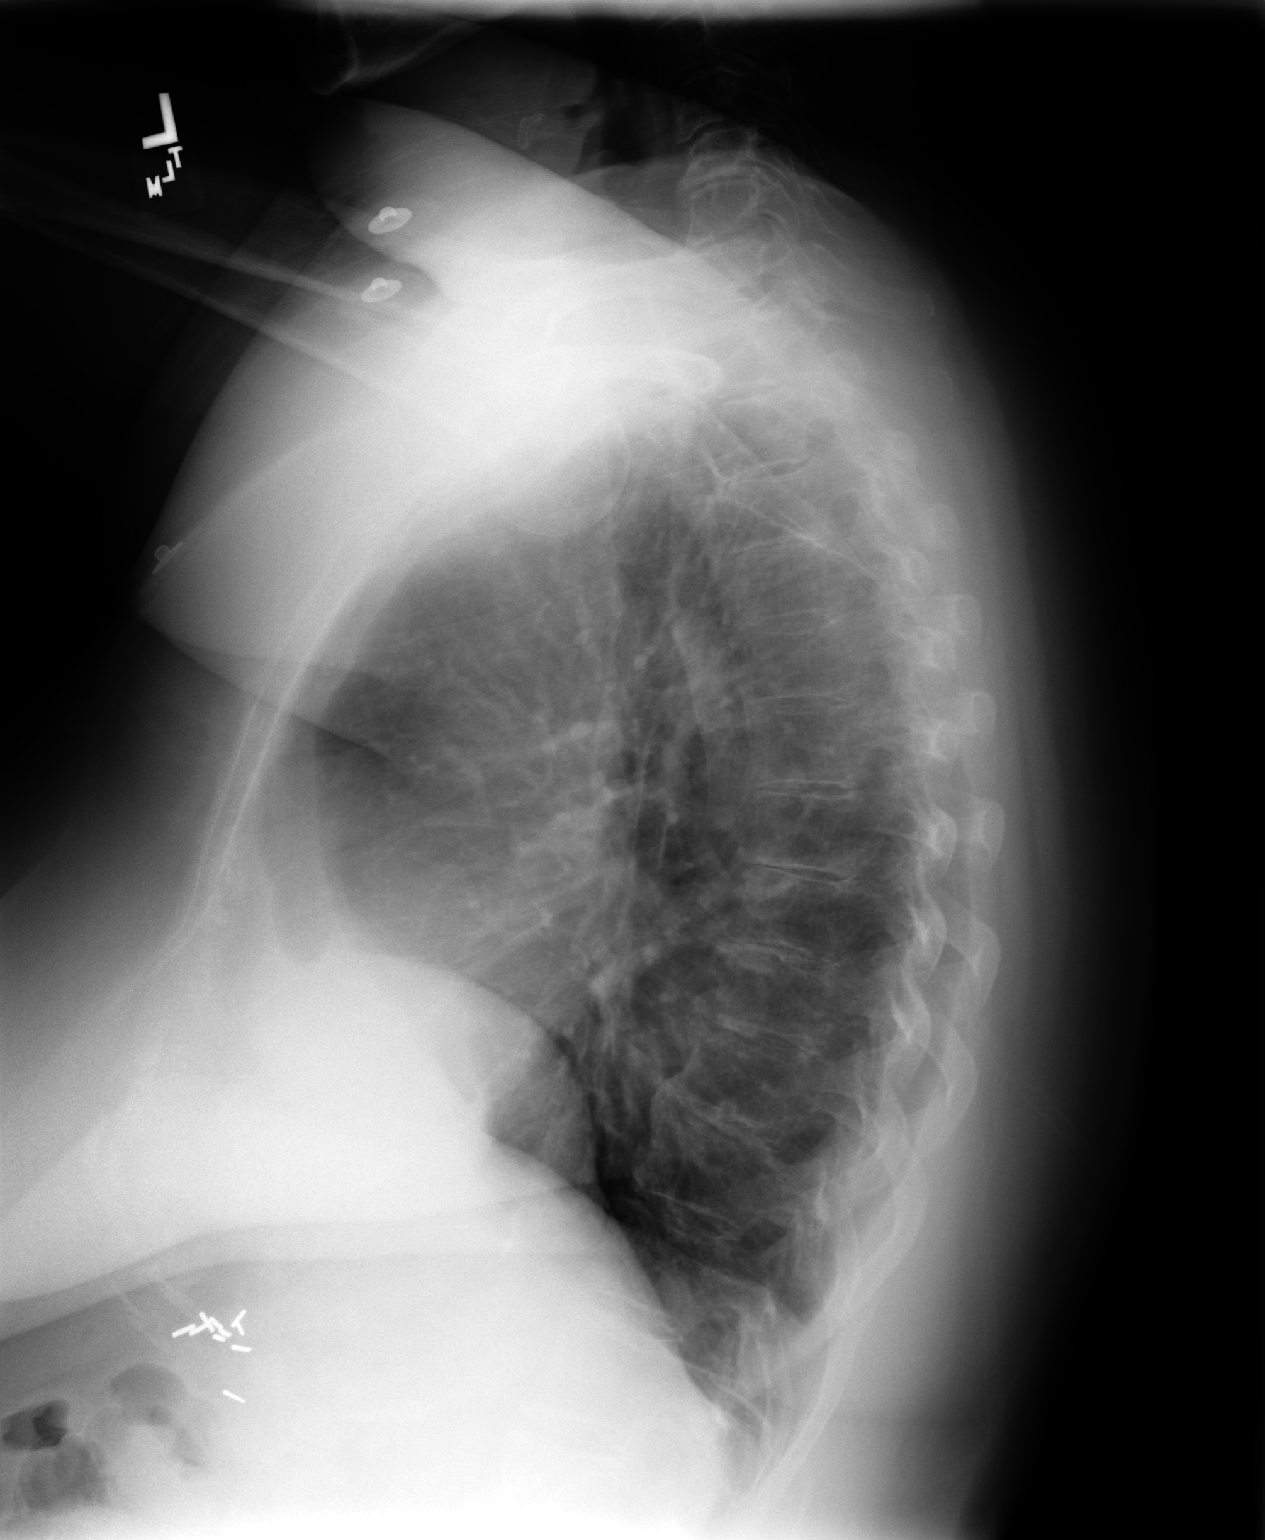

[2 of 2 positions shown; findings below may reference images not displayed]

PROCEDURE:     DXR - DXR CHEST PA (OR AP) AND LATERAL  - September 03, 2005 [DATE]

RESULT:     PA and lateral views were obtained and compared with the
previous exam of 05/01/2005.

The cardiomediastinal structures appear within normal limits. The lung
fields are clear. No effusions are seen.

Degenerative spurs are noted in the thoracic spine.
IMPRESSION: The lung fields are clear.

## 2007-05-18 ENCOUNTER — Ambulatory Visit: Payer: Self-pay | Admitting: Pain Medicine

## 2007-05-24 ENCOUNTER — Ambulatory Visit: Payer: Self-pay | Admitting: Pain Medicine

## 2007-06-04 IMAGING — CT CT STONE STUDY
1 of 2 series · 16 of 32 positions shown, 20 images · non-contrast
Comparison: none

REASON FOR EXAM: Abdominal pain, stone protocol. Rm 6
COMMENTS:

[Series 2: stone · axial · 0.64mm/px · z∈[-376,+62]mm · 16 of 158 slices shown, 20 images]
[im 6/158  soft-tissue]
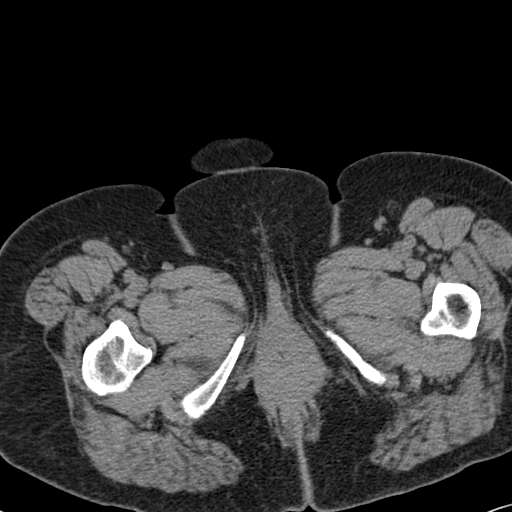
[im 6/158  bone]
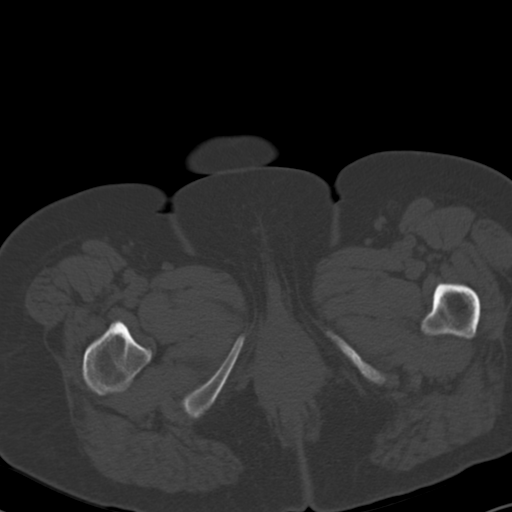
[im 17/158  soft-tissue]
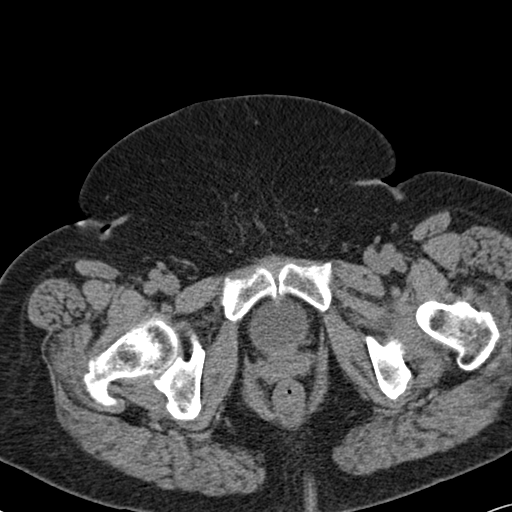
[im 29/158  soft-tissue]
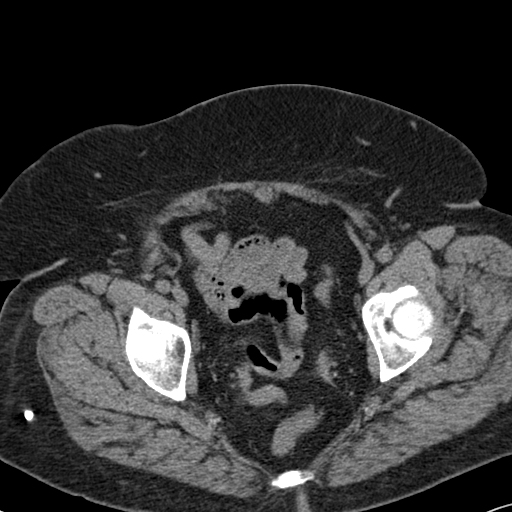
[im 40/158  soft-tissue]
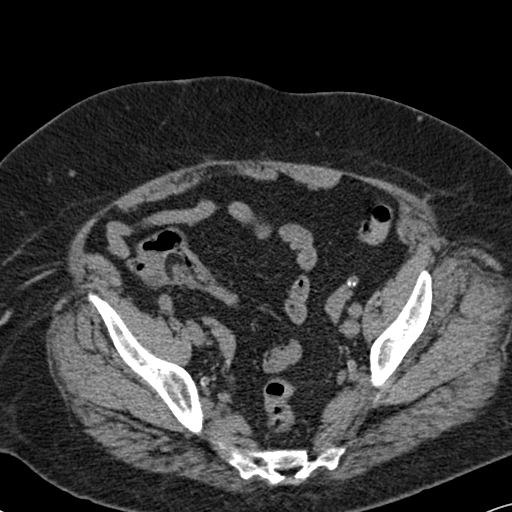
[im 51/158  soft-tissue]
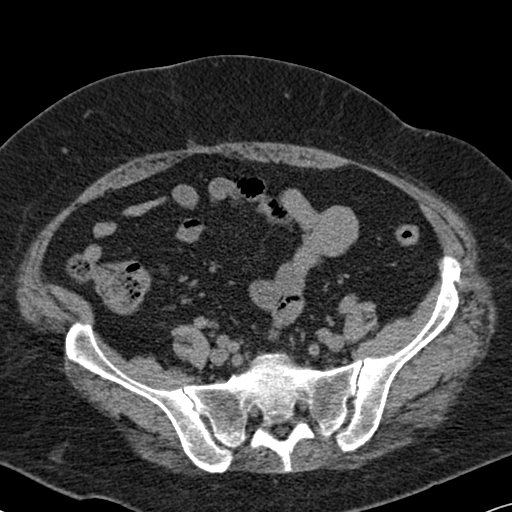
[im 62/158  soft-tissue]
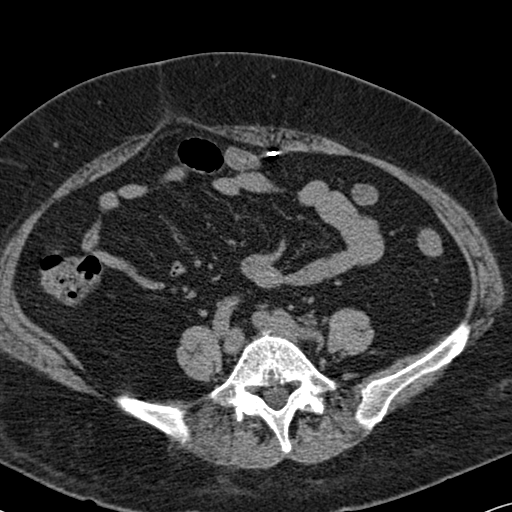
[im 73/158  soft-tissue]
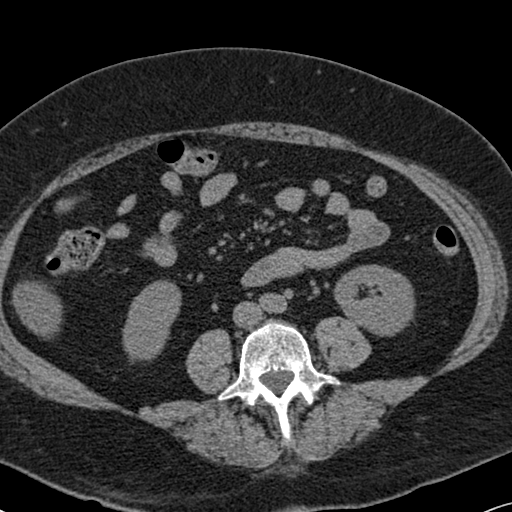
[im 85/158  soft-tissue]
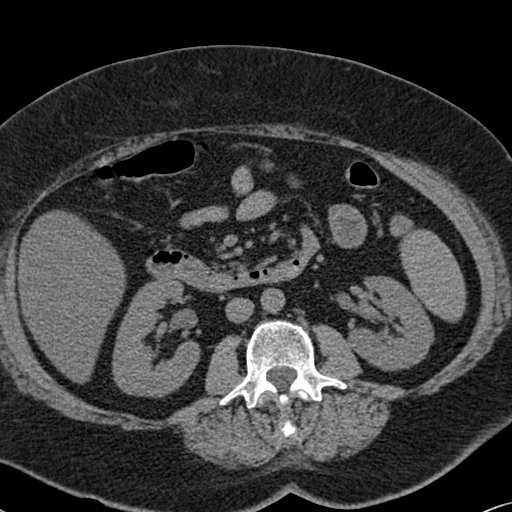
[im 96/158  soft-tissue]
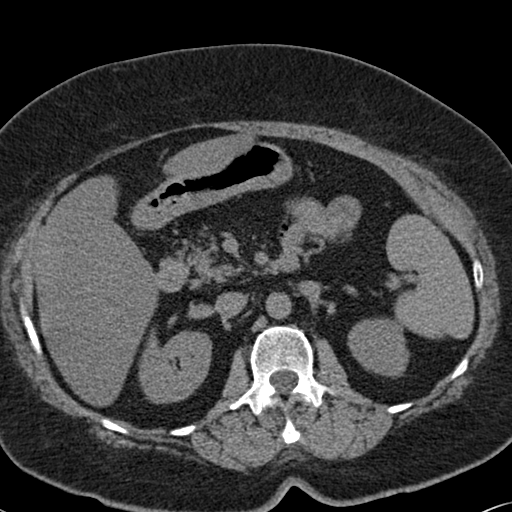
[im 96/158  bone]
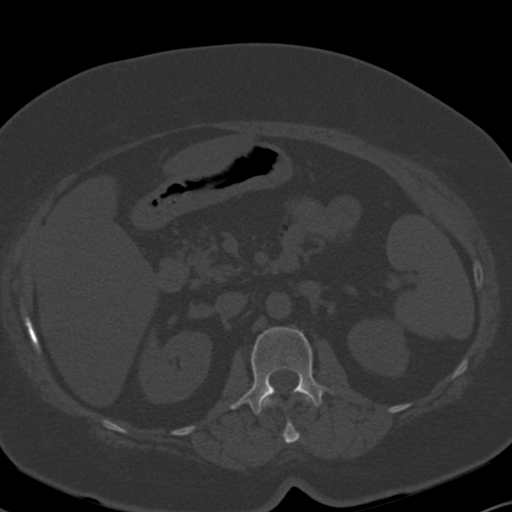
[im 107/158  soft-tissue]
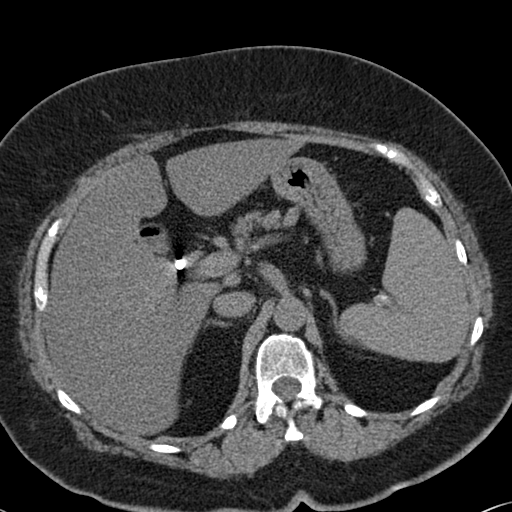
[im 118/158  soft-tissue]
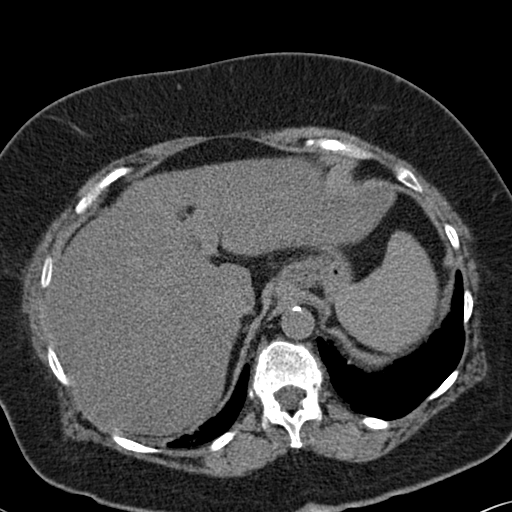
[im 129/158  soft-tissue]
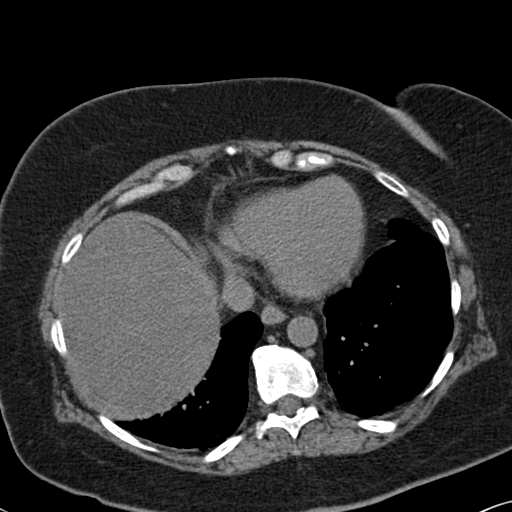
[im 135/158  lung]
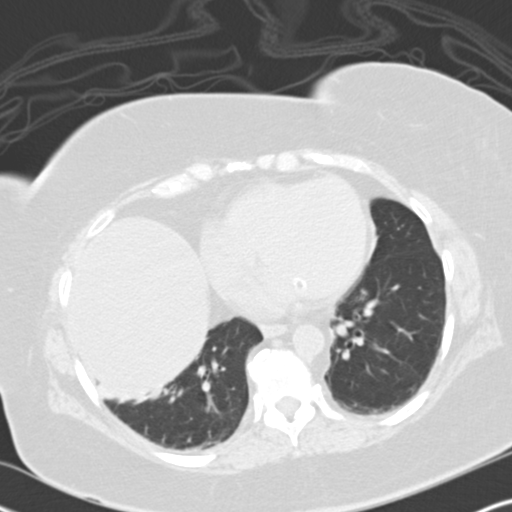
[im 141/158  soft-tissue]
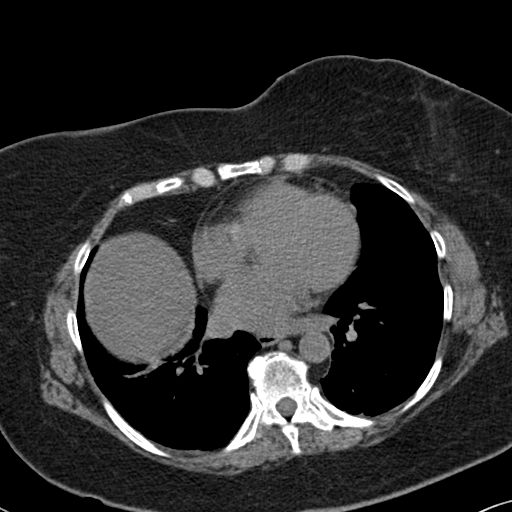
[im 141/158  lung]
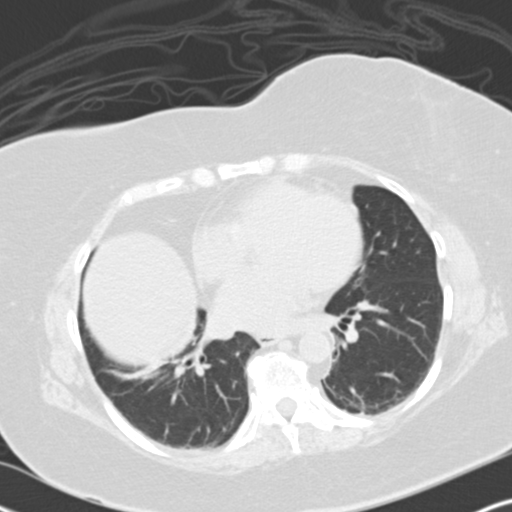
[im 146/158  lung]
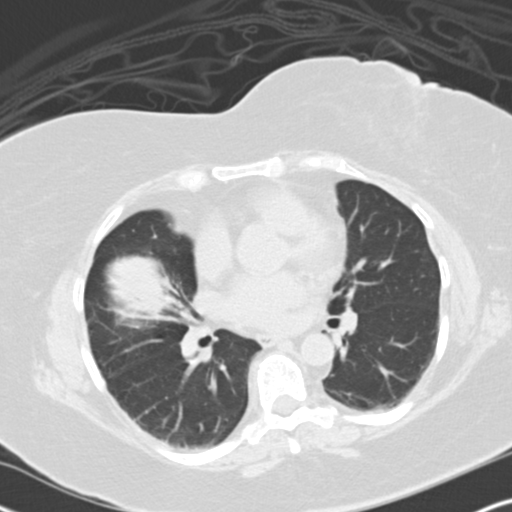
[im 152/158  soft-tissue]
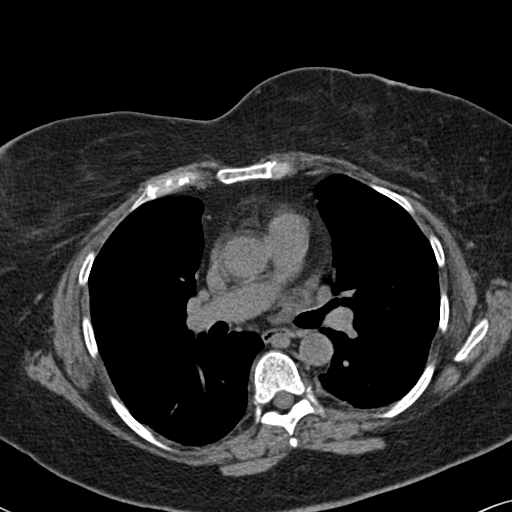
[im 152/158  lung]
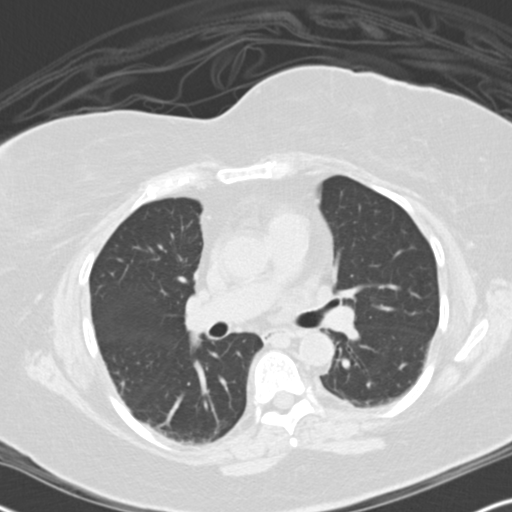

[16 of 32 positions shown; findings below may reference images not displayed]

PROCEDURE:     CT  - CT ABDOMEN /PELVIS WO (STONE)  - September 21, 2005  [DATE]

RESULT:     Emergent unenhanced CT scan was performed for lower quadrant
pain and RIGHT flank pain.

No renal or ureteral calculi are identified.  No hydronephrosis.  No
perinephric stranding.  No abdominal or pelvic fluid is seen.  No pelvic or
retroperitoneal masses are seen.  No major organ abnormalities are noted.
The lung bases appear clear.
IMPRESSION: No renal or ureteral calculi are identified.

No infiltration of the fat is noted to suggest appendicitis or
diverticulitis.

The report was called to the emergency room at the conclusion of dictation.

## 2007-06-04 IMAGING — US ABDOMEN ULTRASOUND
1 series · 14 of 25 positions shown · non-contrast
Comparison: none

REASON FOR EXAM: Abdominal pain. Rm 6
COMMENTS:

[Series 1: abdomen ultrasound · 0.36mm/px · 14 of 43 slices shown]
[im 1/43]
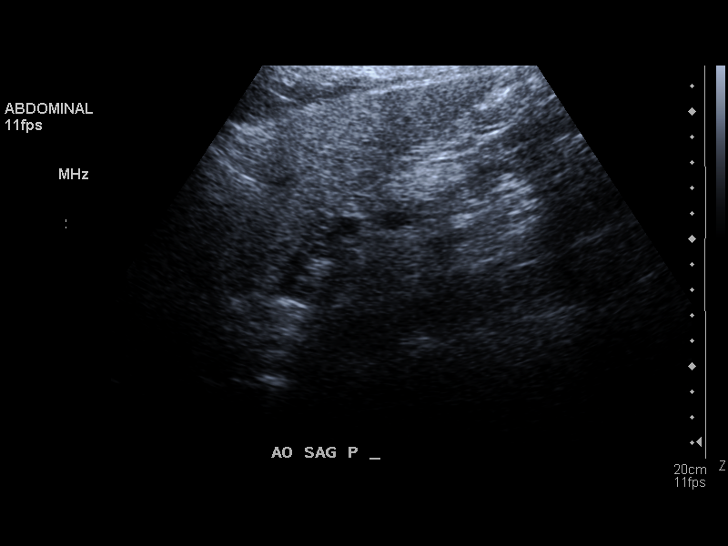
[im 4/43]
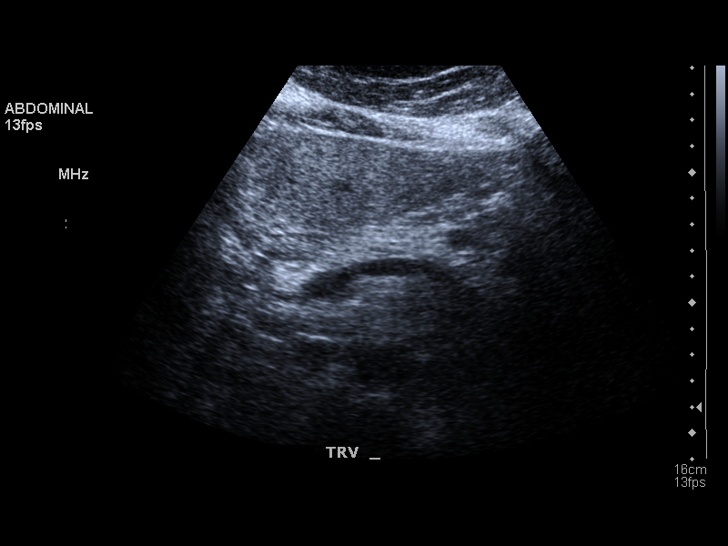
[im 8/43]
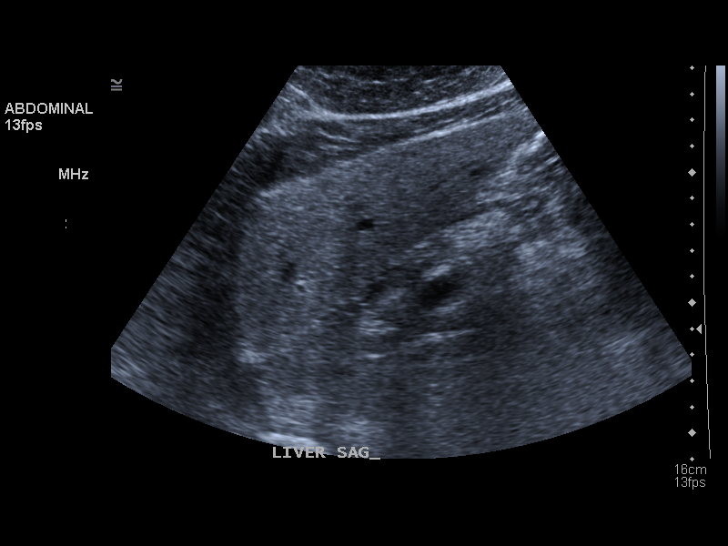
[im 11/43]
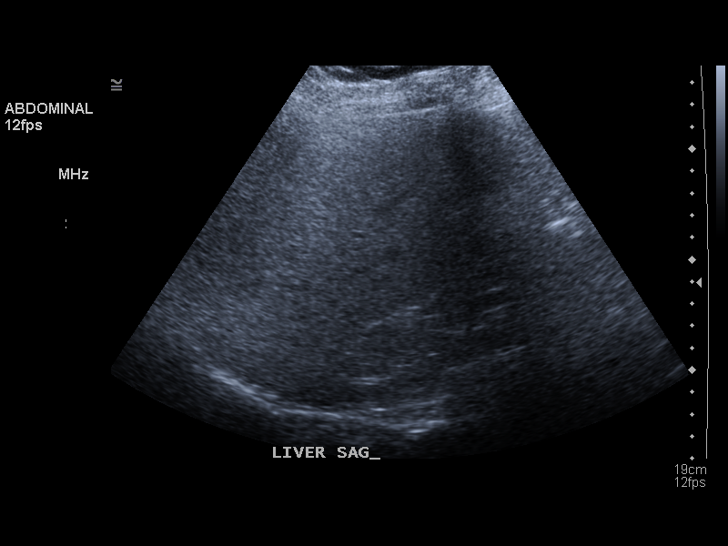
[im 15/43]
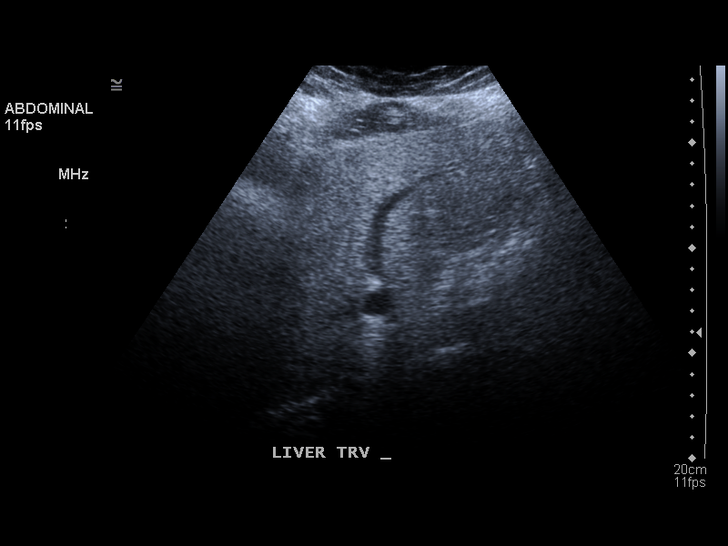
[im 16/43]
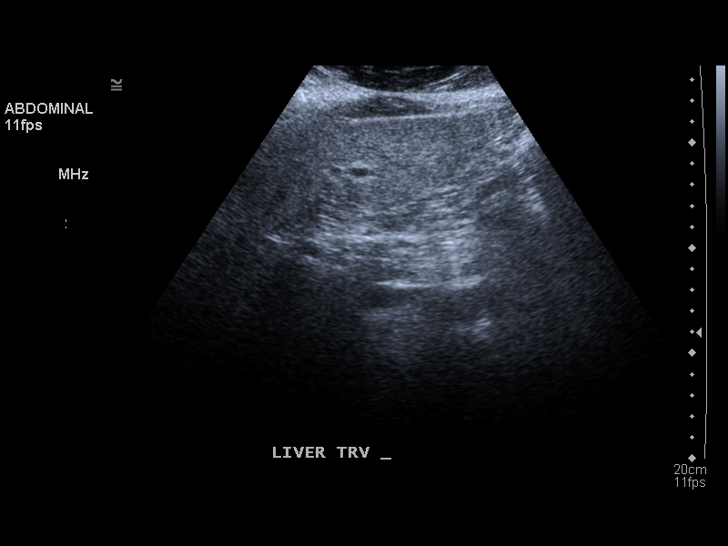
[im 20/43]
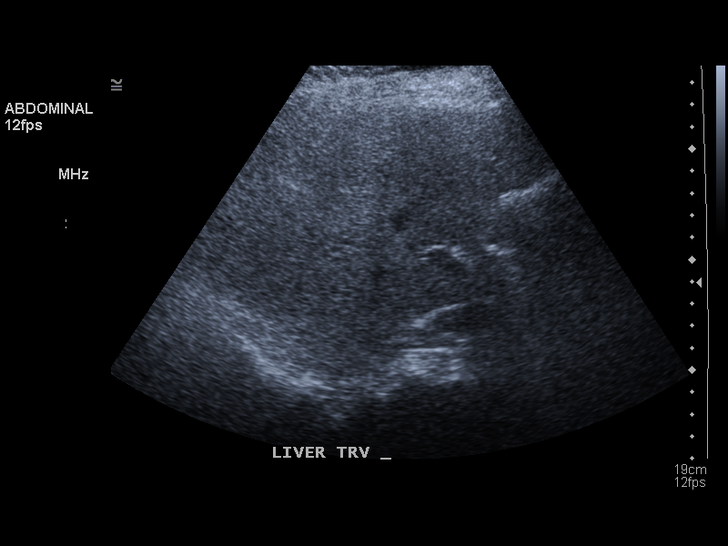
[im 23/43]
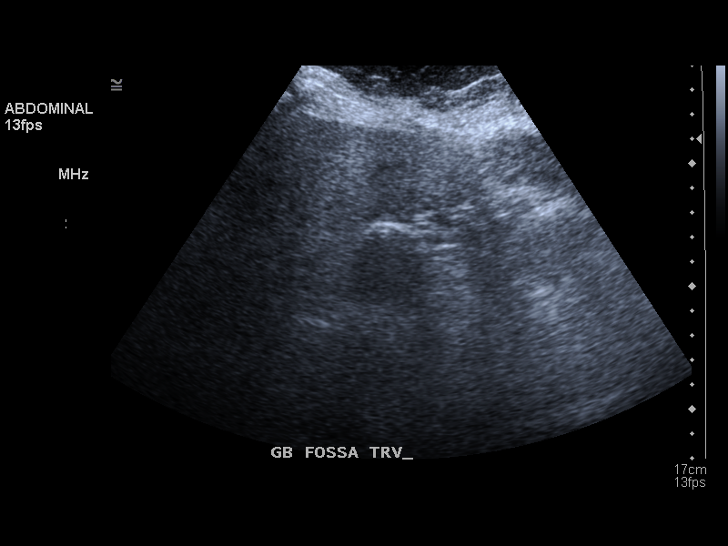
[im 27/43]
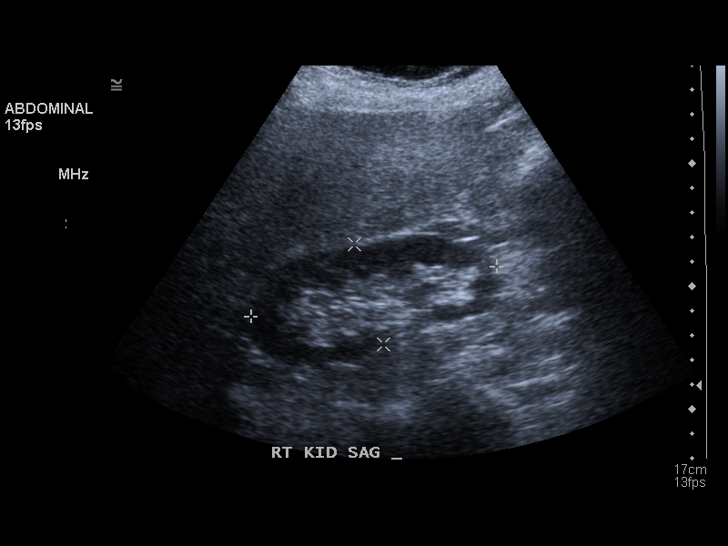
[im 29/43]
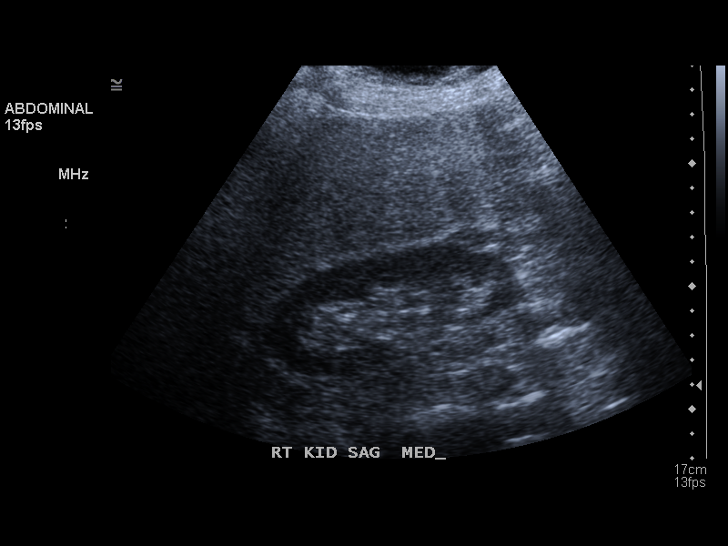
[im 32/43]
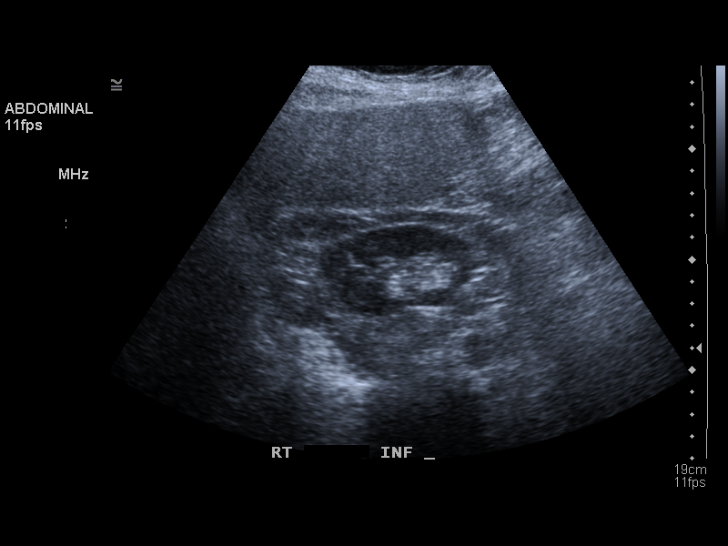
[im 36/43]
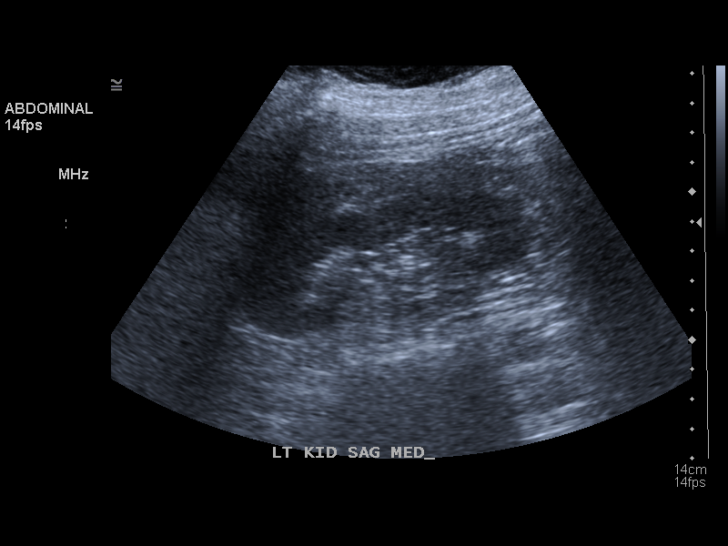
[im 39/43]
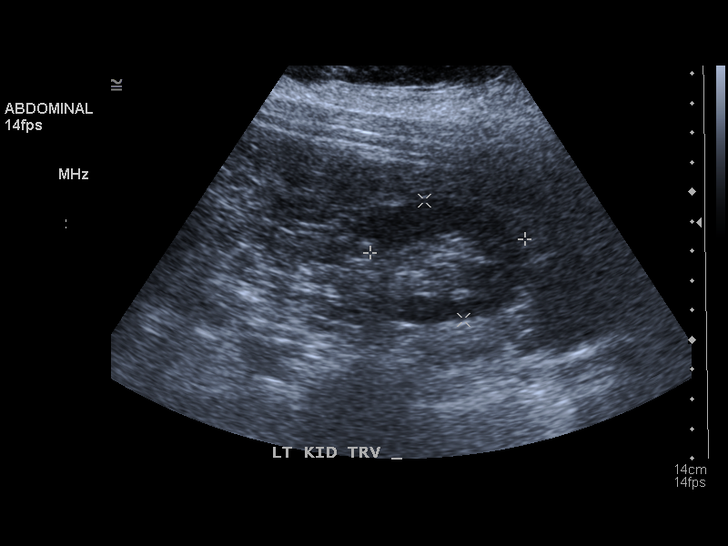
[im 43/43]
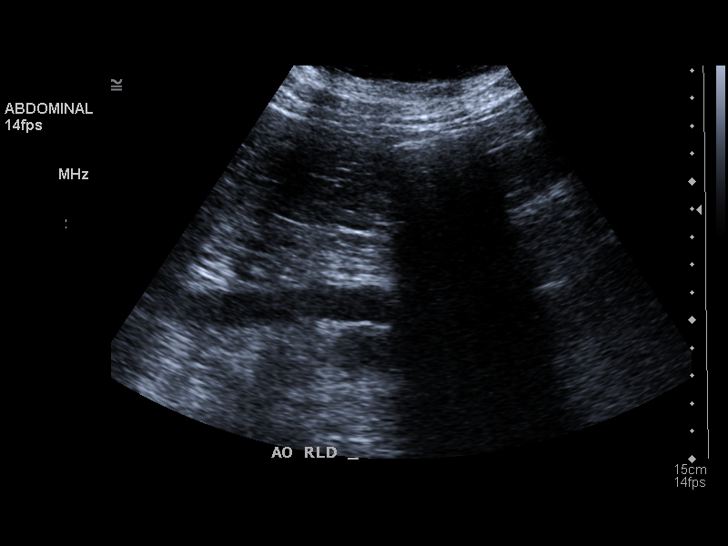

[14 of 25 positions shown; findings below may reference images not displayed]

PROCEDURE:     US  - US ABDOMEN GENERAL SURVEY  - September 21, 2005  [DATE]

RESULT:     The liver shows no specific abnormalities.  The spleen is upper
limits for normal in size.  The patient is status post cholecystectomy.  The
common bile duct measures 3.6 mm in diameter which is within normal limits.
The kidneys show no hydronephrosis.  There is no ascites.  The abdominal
aorta is normal in appearance.
IMPRESSION: The spleen is upper limits for normal in size or slightly enlarged.

The patient is status post cholecystectomy.

No acute changes are identified sonographically.

## 2007-06-06 ENCOUNTER — Ambulatory Visit: Payer: Self-pay | Admitting: Pain Medicine

## 2007-06-23 ENCOUNTER — Ambulatory Visit: Payer: Self-pay | Admitting: Pain Medicine

## 2007-06-27 ENCOUNTER — Ambulatory Visit: Payer: Self-pay | Admitting: Pain Medicine

## 2007-07-11 ENCOUNTER — Ambulatory Visit: Payer: Self-pay | Admitting: Neurosurgery

## 2007-07-21 ENCOUNTER — Ambulatory Visit: Payer: Self-pay | Admitting: Pain Medicine

## 2007-07-24 IMAGING — CR DG CHEST 2V
1 series · 2 of 2 positions shown · non-contrast
Comparison: none

REASON FOR EXAM: Pneumonia
COMMENTS:

[Series 1: view not recorded · 0.17mm/px · 2 of 2 slices shown]
[im 1/2]
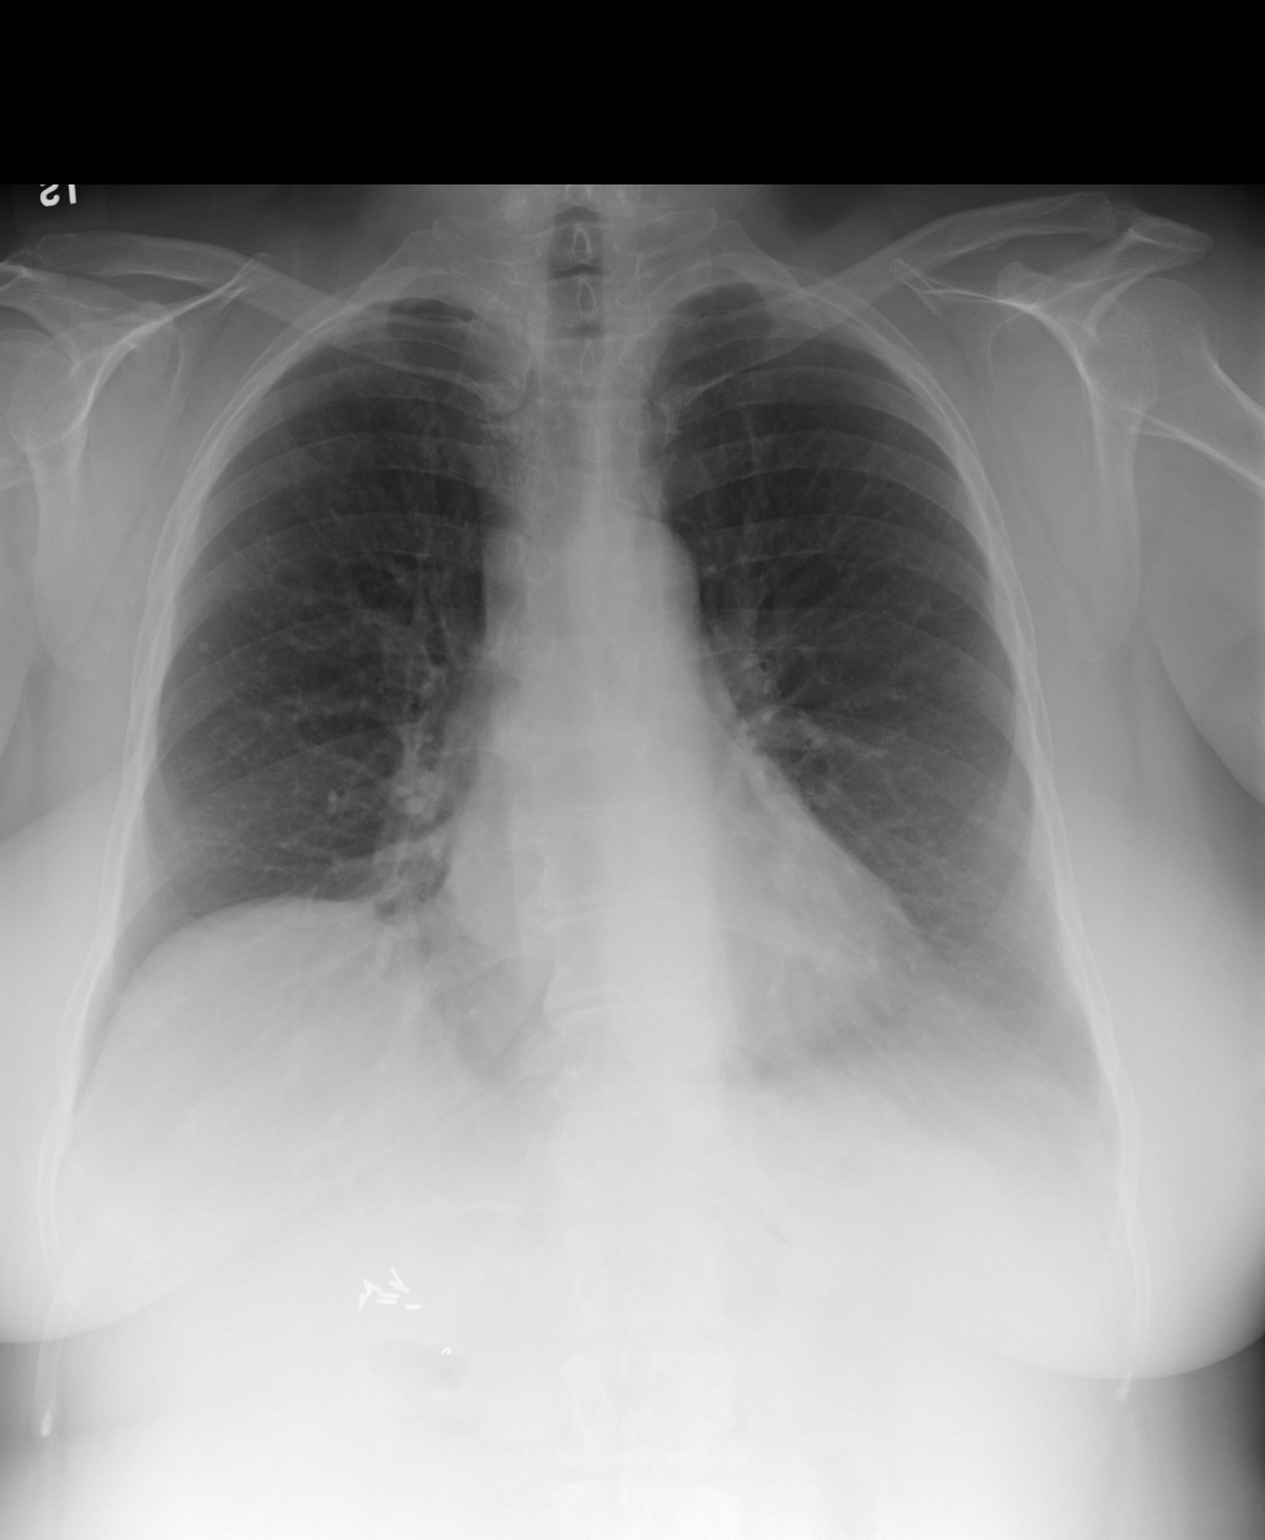
[im 2/2]
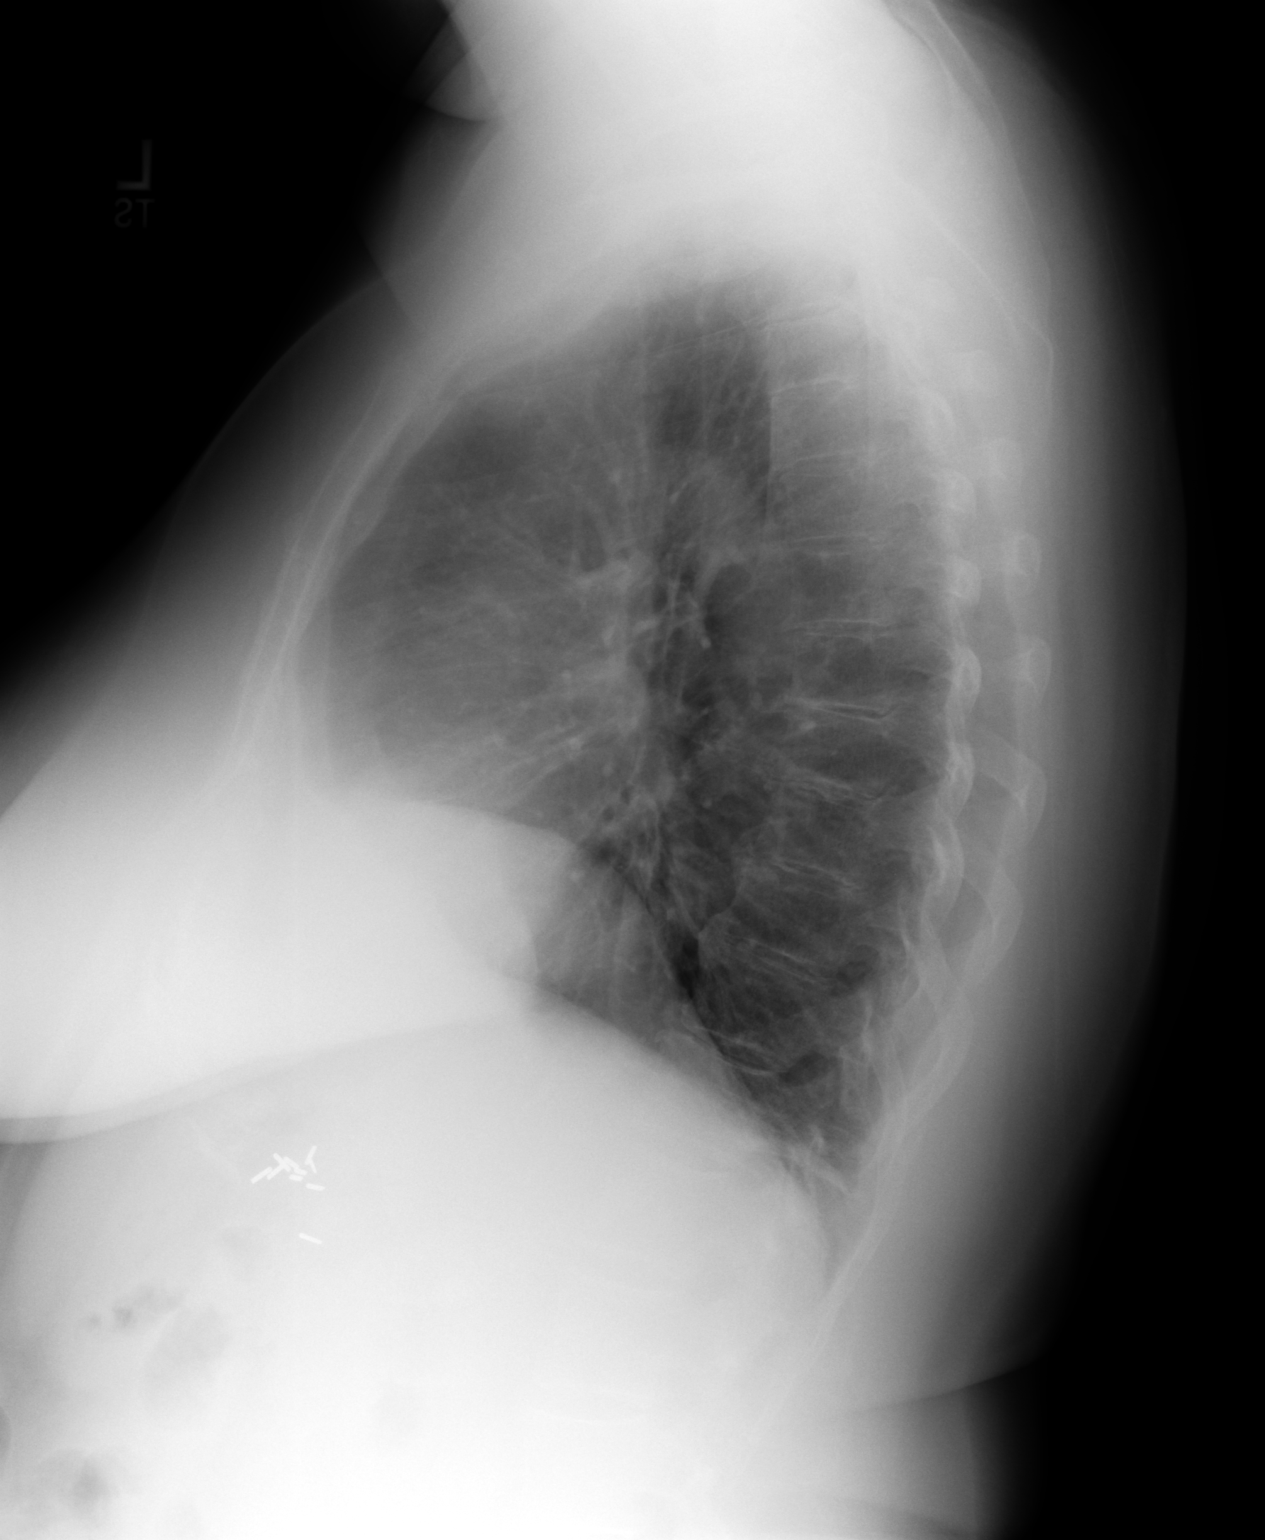

[2 of 2 positions shown; findings below may reference images not displayed]

PROCEDURE:     DXR - DXR CHEST PA (OR AP) AND LATERAL  - November 10, 2005  [DATE]

RESULT:       PA and lateral view were obtained and compared to prior study
of 09/03/05.   The cardiomediastinal structures are within normal limits.
The lung fields are clear.  Vascularity is within normal limits.  No
effusions are identified. Multiple surgical clips are noted in the RIGHT
upper quadrant secondary to prior cholecystectomy.
IMPRESSION: The lung fields are clear.  No pneumonia is identified.

## 2007-08-24 ENCOUNTER — Ambulatory Visit: Payer: Self-pay | Admitting: Pain Medicine

## 2007-10-03 IMAGING — CR DG CHEST 1V PORT
1 series · 1 of 1 positions shown · non-contrast
Comparison: none

REASON FOR EXAM: CP
COMMENTS:

[view not recorded]
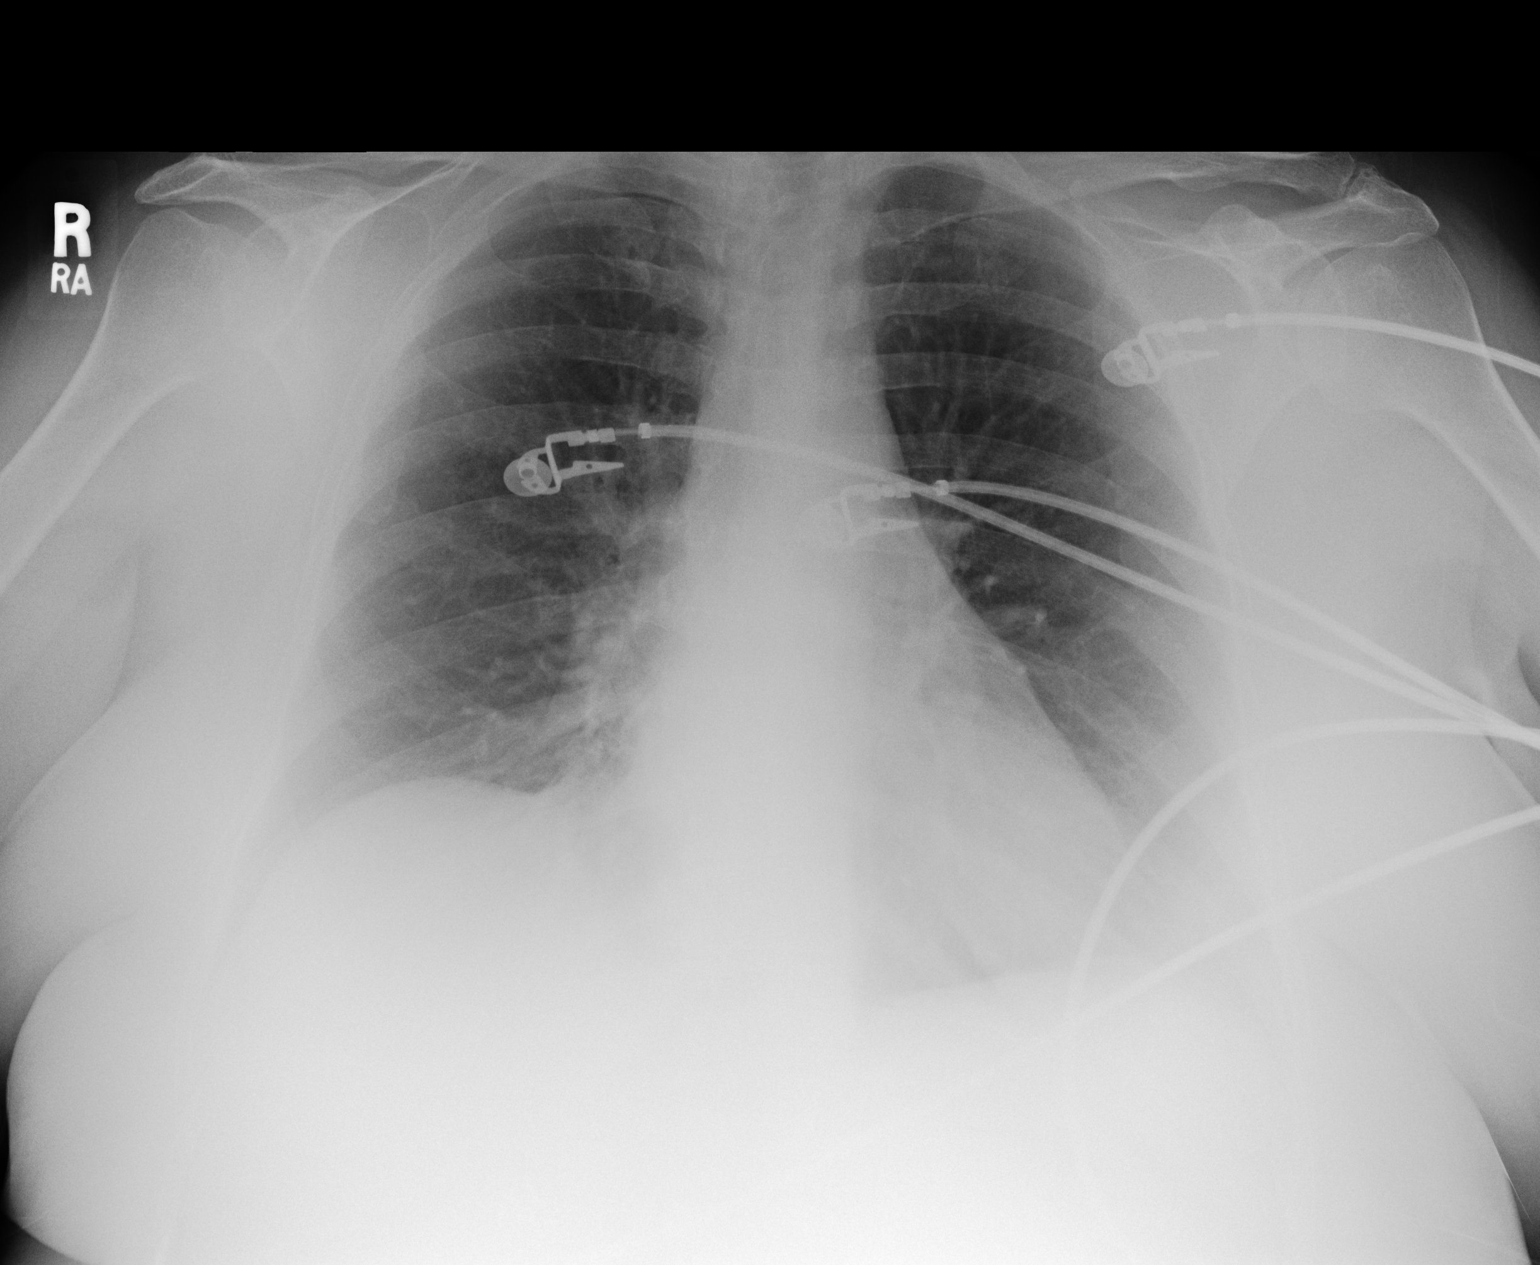

[1 of 1 positions shown; findings below may reference images not displayed]

PROCEDURE:     DXR - DXR PORTABLE CHEST SINGLE VIEW  - January 20, 2006 [DATE]

RESULT:     AP view of the chest shows the lung fields to be clear.  No
pneumonia, pneumothorax, or pleural effusion is seen.  The heart and
pulmonary vasculature are normal in appearance.  Monitoring electrodes are
present.
IMPRESSION: No acute changes are identified.

## 2007-10-04 IMAGING — MR MRI HEAD WITHOUT AND WITH CONTRAST
8 series · 48 of 48 positions shown · non-contrast
Comparison: none

REASON FOR EXAM: Ataxia
COMMENTS:

[Series 3: t1_sag · axial · 10.0mm · 0.55mm/px · z∈[+0,+114]mm · 6 of 20 slices shown]
[im 1/20]
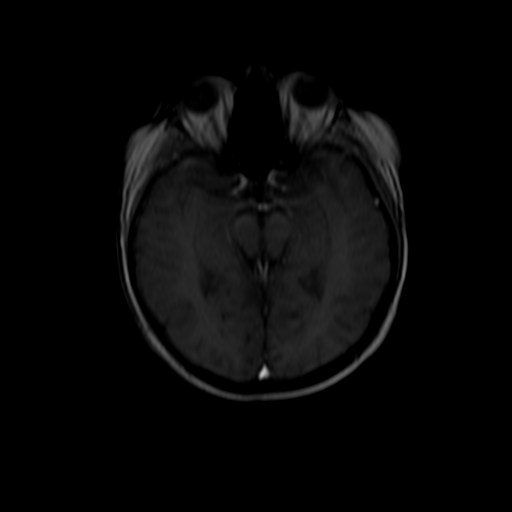
[im 4/20]
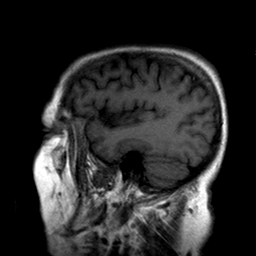
[im 8/20]
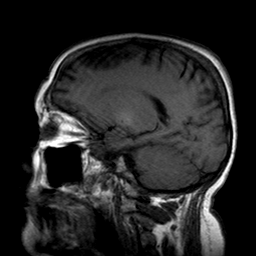
[im 12/20]
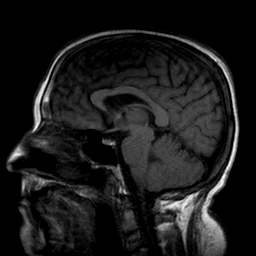
[im 16/20]
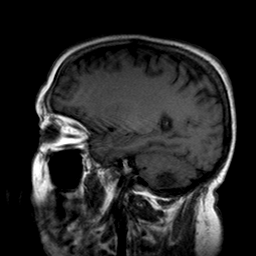
[im 20/20]
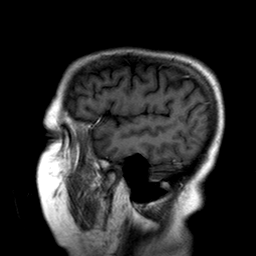

[Series 4: T1 · axial · 5.0mm · 0.90mm/px · z∈[-35,+114]mm · 7 of 24 slices shown (1 of 4)]
[im 1/24]
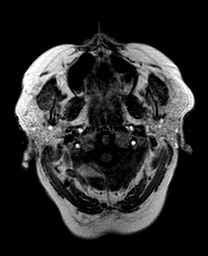
[im 4/24]
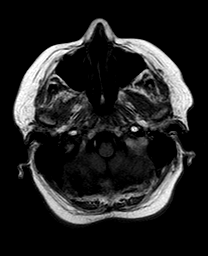
[im 8/24]
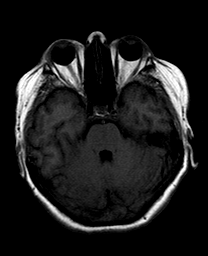
[im 12/24]
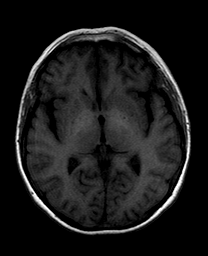
[im 16/24]
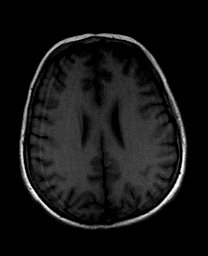
[im 20/24]
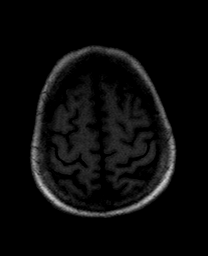
[im 24/24]
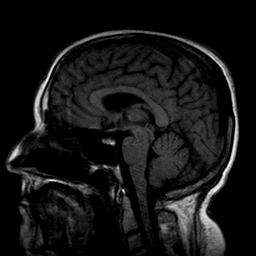

[Series 7: t2_fast axial (single · axial · 5.0mm · 0.45mm/px · z∈[-35,+114]mm · 6 of 24 slices shown]
[im 1/24]
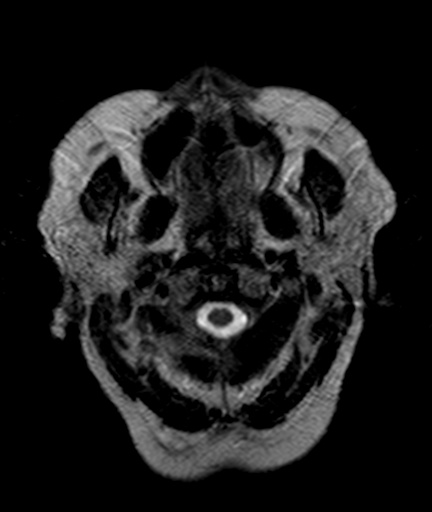
[im 5/24]
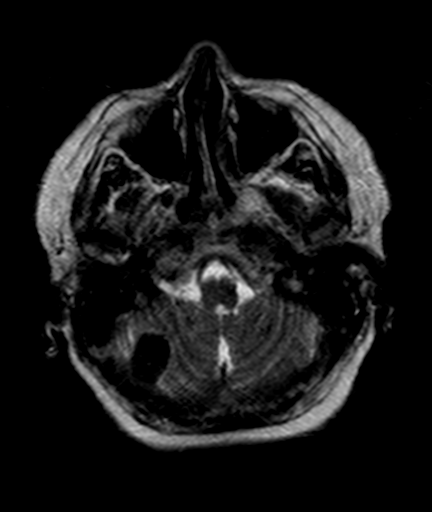
[im 10/24]
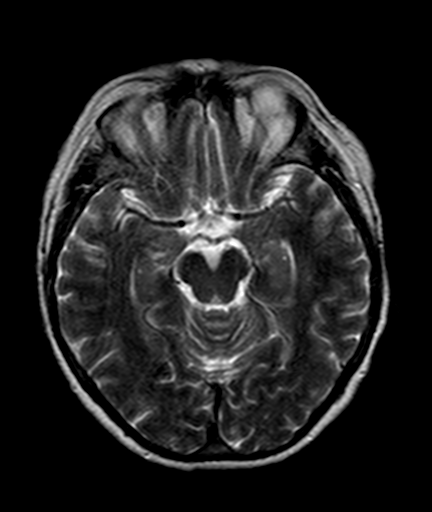
[im 14/24]
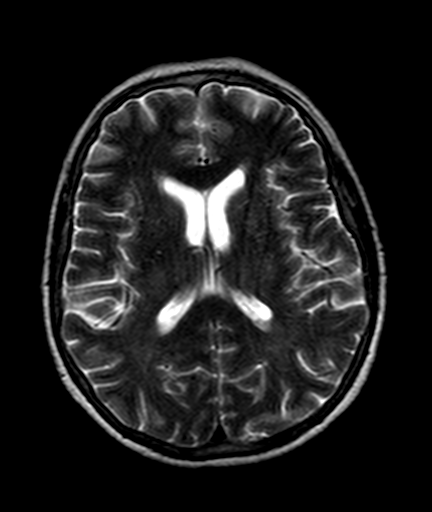
[im 19/24]
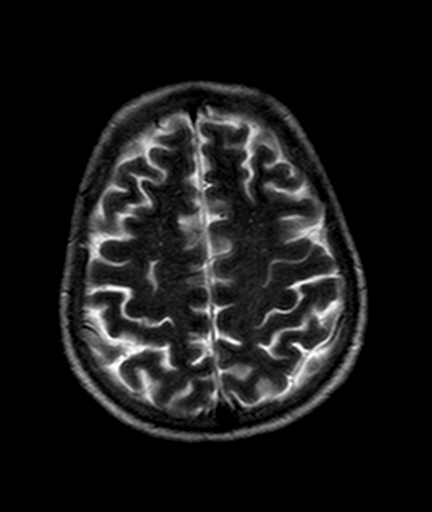
[im 24/24]
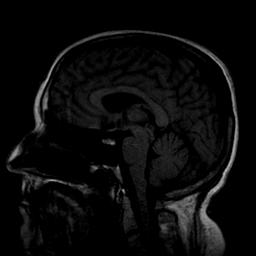

[Series 9: FLAIR · axial · 5.0mm · 1.80mm/px · z∈[-35,+114]mm · 6 of 24 slices shown]
[im 1/24]
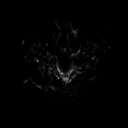
[im 5/24]
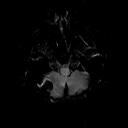
[im 10/24]
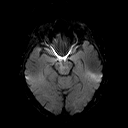
[im 14/24]
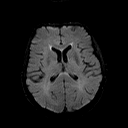
[im 19/24]
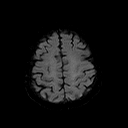
[im 24/24]
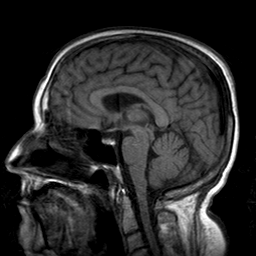

[Series 11: T1 · coronal · 5.0mm · 0.86mm/px · 6 of 24 slices shown (2 of 4)]
[im 1/24]
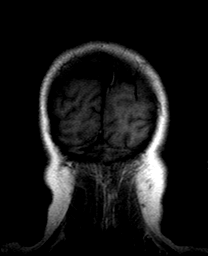
[im 5/24]
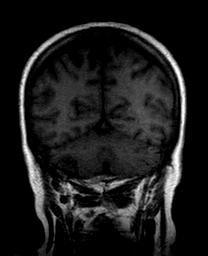
[im 10/24]
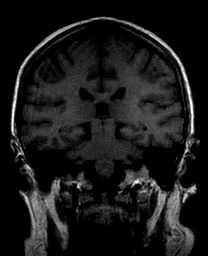
[im 14/24]
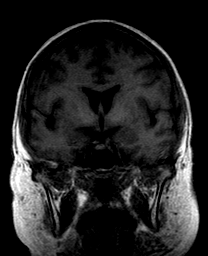
[im 19/24]
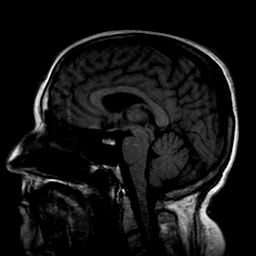
[im 24/24]
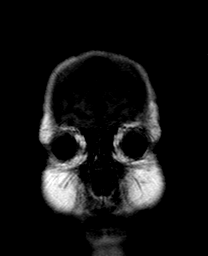

[Series 16: T1 · coronal · 5.0mm · 0.86mm/px · 6 of 25 slices shown (3 of 4)]
[im 1/25]
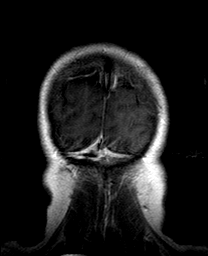
[im 5/25]
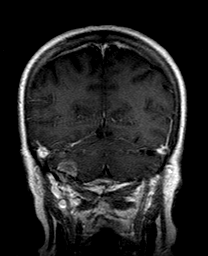
[im 10/25]
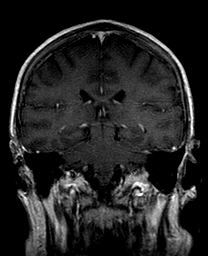
[im 15/25]
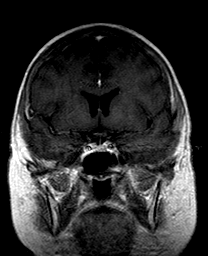
[im 20/25]
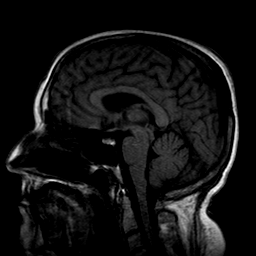
[im 25/25]
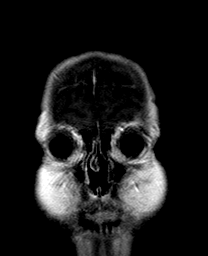

[Series 18: T1 · axial · 5.0mm · 0.90mm/px · z∈[-35,+103]mm · 6 of 23 slices shown (4 of 4)]
[im 1/23]
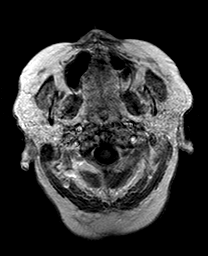
[im 5/23]
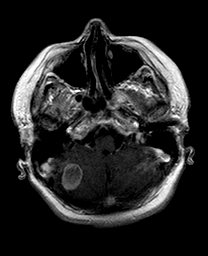
[im 9/23]
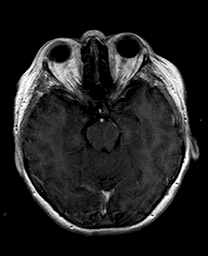
[im 14/23]
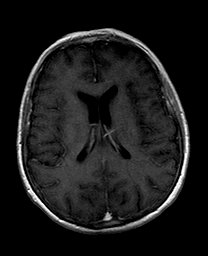
[im 18/23]
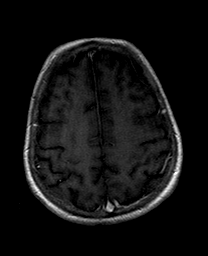
[im 23/23]
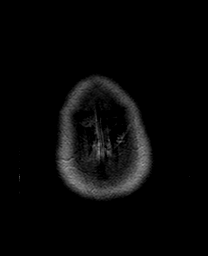

[Series 5006: DWI · axial · 5.0mm · 1.80mm/px · z∈[-16,+114]mm · 5 of 21 slices shown]
[im 1/21]
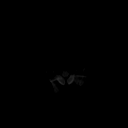
[im 6/21]
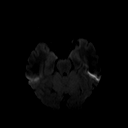
[im 11/21]
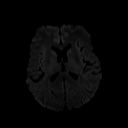
[im 16/21]
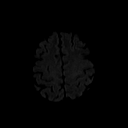
[im 21/21]
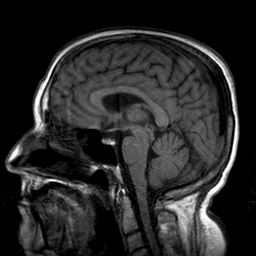

[48 of 48 positions shown; findings below may reference images not displayed]

PROCEDURE:     MR  - MR BRAIN WO/W CONTRAST  - January 21, 2006 [DATE]

RESULT:     Multiplanar images were obtained through the brain following
administration of gadolinium.  The patient has unexplained ataxia.

The diffusion-weighted sequences exhibit no finding suspicious for acute
ischemia.  However, there is markedly decreased signal in the RIGHT
cerebellar hemisphere.  On the inversion recovery-FLAIR sequences this area
in the RIGHT cerebellar hemisphere continues to be low in signal intensity.
On the standard spin-echo images the T1 precontrast images exhibit an
approximately 2.3 x 1.6 x 1.4 cm diameter enhancing mass in the RIGHT
cerebellar hemisphere.  It has a relatively flattened surface adjacent to
the floor of the posterior fossa.  A definite dural tail is not seen.  I do
not see other abnormal abnormally enhancing masses within the brain.  The
FLAIR sequences reveal minimally increased periventricular white matter
signal associated with the lateral ventricles.  The standard spin-echo
images reveal no evidence of hydrocephalus or of an intracranial hemorrhage.
 The observed portions of the paranasal sinuses are normal.  The sella and
parasellar structures are normal in appearance.
IMPRESSION: There is an abnormal enhancing mass in the RIGHT cerebellar hemisphere
measuring 2.3 x 1.6 x 1.4 cm.  It appears to be in part adjacent to the
floor of the posterior fossa but is not classically a meningioma.  Other
primary versus neoplastic lesions must be considered.  There is not a
significant amount of surrounding edema and thus this is likely a slow
growing process and is also unlikely inflammatory in nature.  The
possibility of this reflecting a slightly atypical meningioma is raised.
Neurosurgical evaluation is recommended.

## 2007-10-05 IMAGING — CT CT CHEST-ABD W/ CM
1 of 2 series · 14 of 31 positions shown, 19 images · IV contrast (APPLIED)
Comparison: none

REASON FOR EXAM: (1) mass on brain mri looking for other lesions in pt
presented with chest pain;
COMMENTS:

PROCEDURE:     CT  - CT CHEST AND ABDOMEN W  - January 22, 2006  [DATE]
RESULT:
REASON FOR CONSULTATION: Chest pain.
TECHNIQUE: Axial images were obtained from the apices to the mid pelvis post
intravenous and oral administration of contrast material.
Both mediastinal and lung window settings were obtained for the chest CT.
CHEST CT:

[Series 4: soft tissue · axial · 0.70mm/px · z∈[-302,-68]mm · 14 of 88 slices shown, 19 images]
[im 5/88  mediastinal]
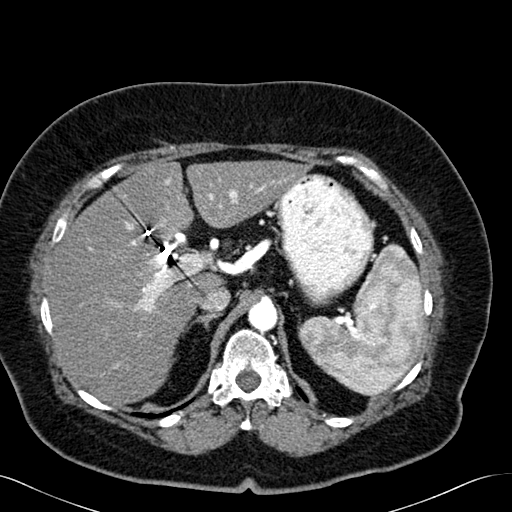
[im 5/88  bone]
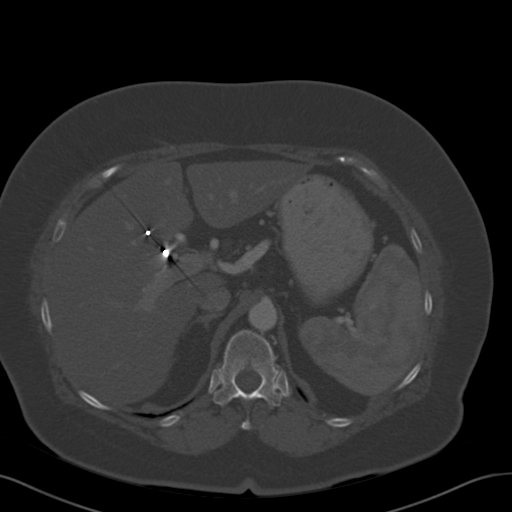
[im 14/88  mediastinal]
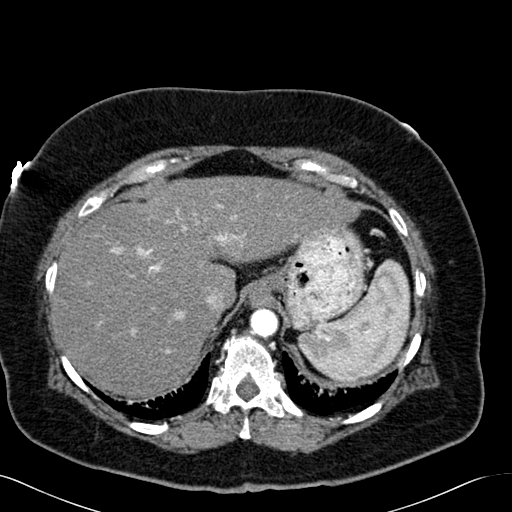
[im 19/88  mediastinal]
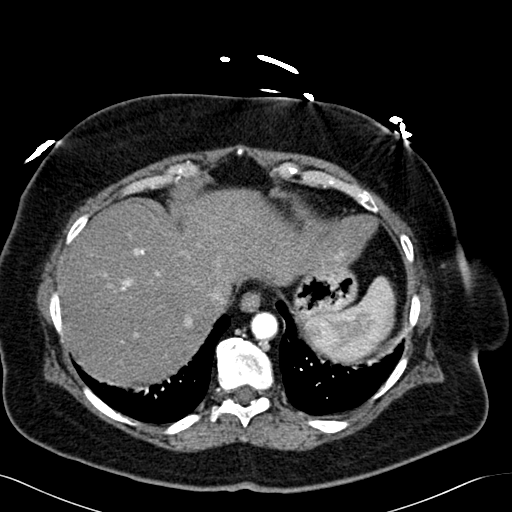
[im 28/88  mediastinal]
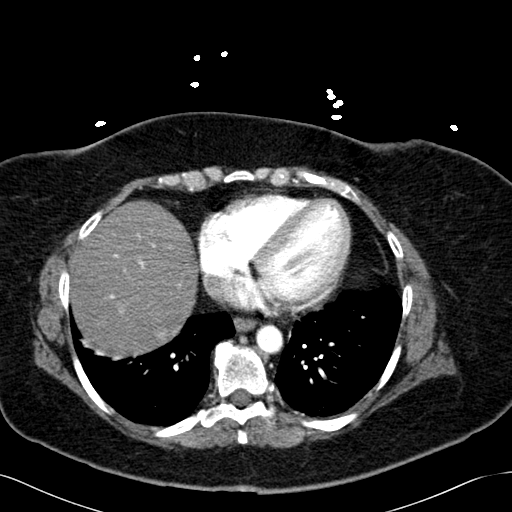
[im 30/88  mediastinal]
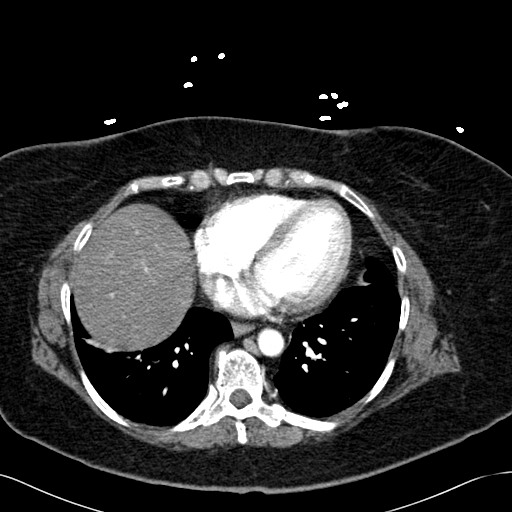
[im 37/88  mediastinal]
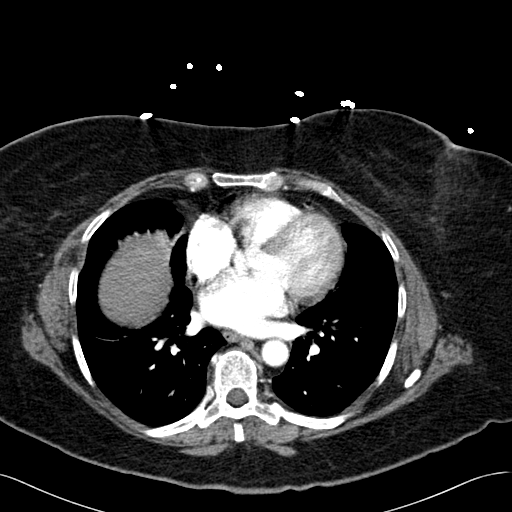
[im 46/88  mediastinal]
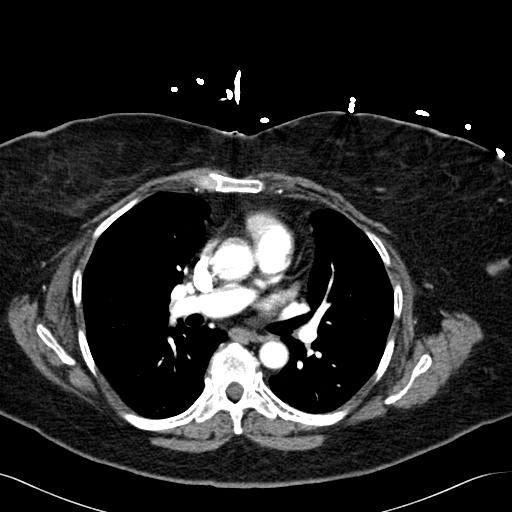
[im 51/88  mediastinal]
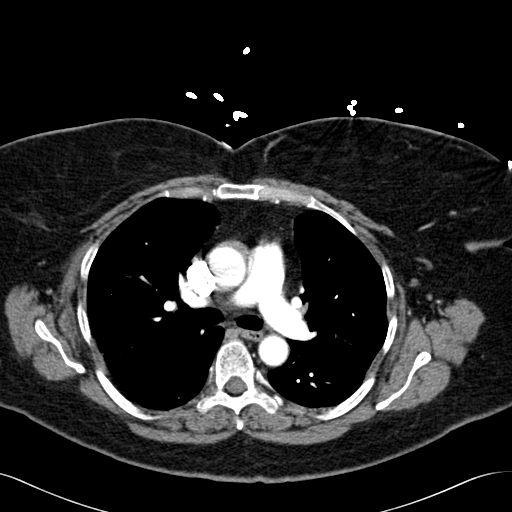
[im 59/88  mediastinal]
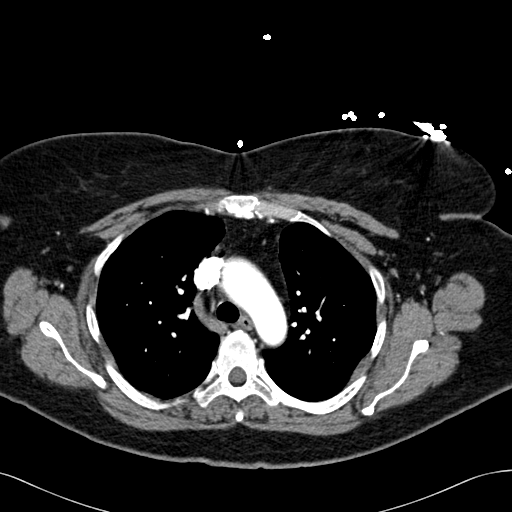
[im 59/88  bone]
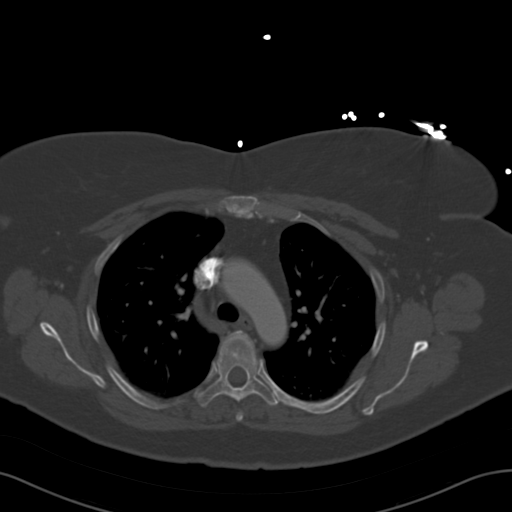
[im 60/88  mediastinal]
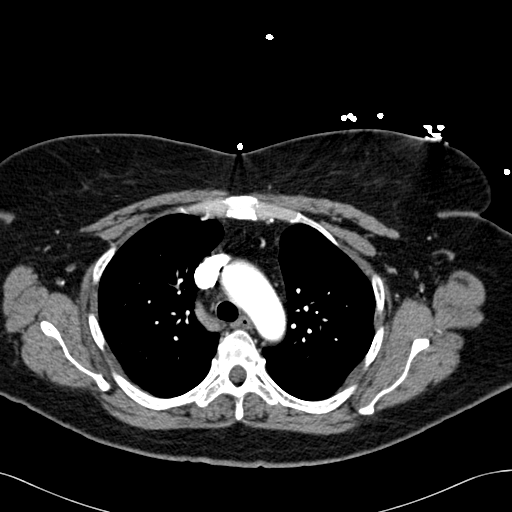
[im 69/88  mediastinal]
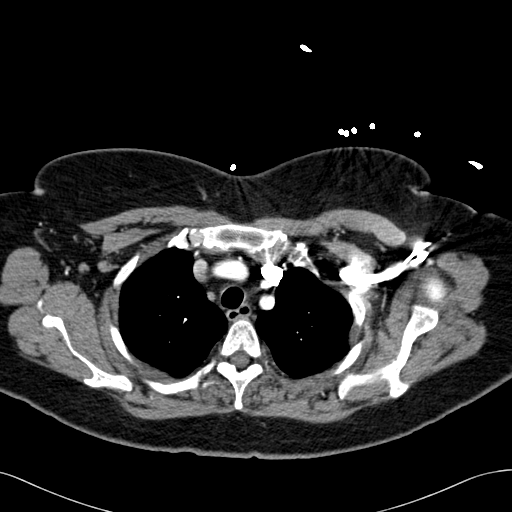
[im 69/88  lung]
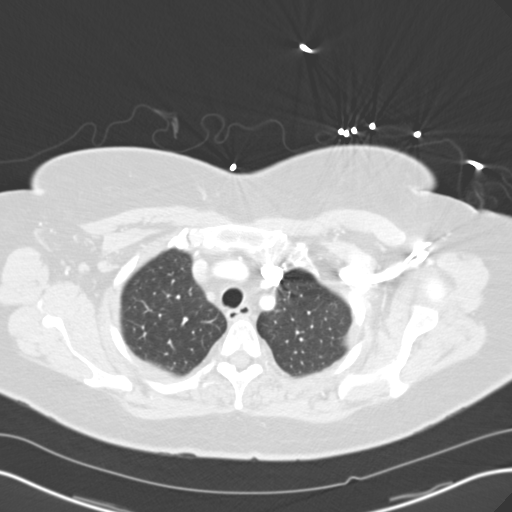
[im 74/88  mediastinal]
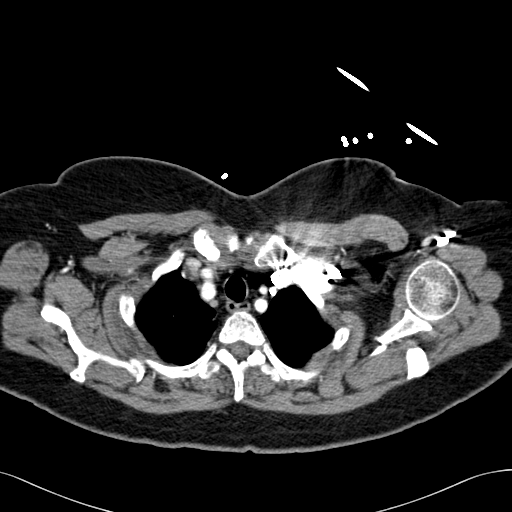
[im 74/88  lung]
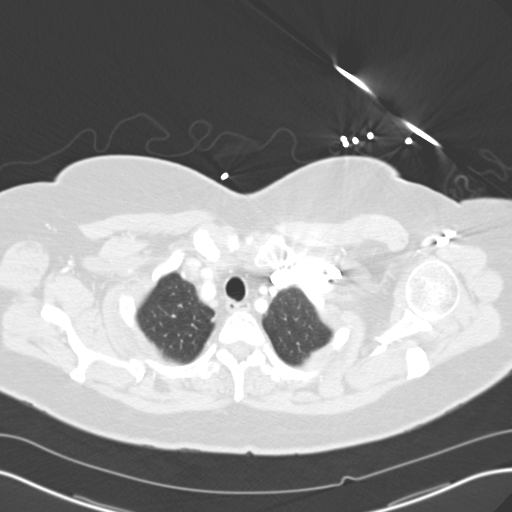
[im 78/88  lung]
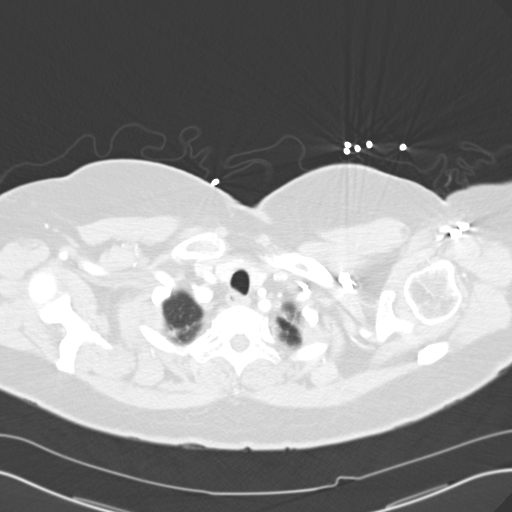
[im 83/88  mediastinal]
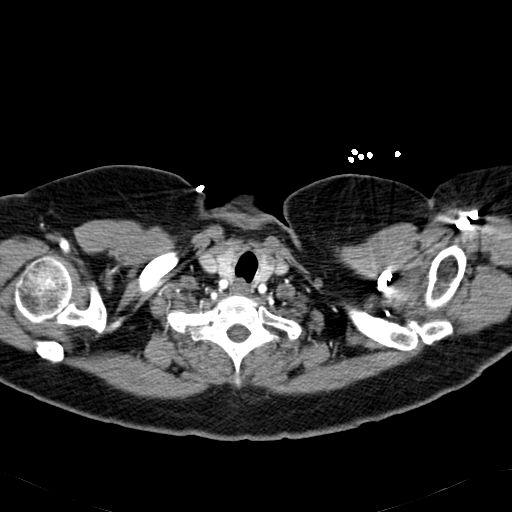
[im 83/88  lung]
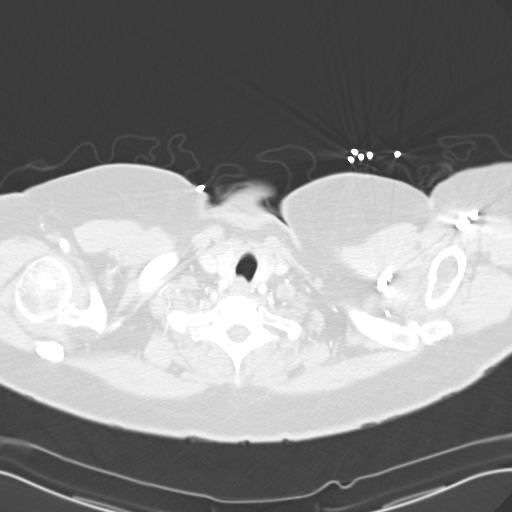

[14 of 31 positions shown; findings below may reference images not displayed]

FINDINGS: No mediastinal masses are noted. No hilar or subcarinal adenopathy
is seen.

On the lung window settings there is noted some scarring in the apices.
There is some linear atelectasis noted in the RIGHT lung base. No definite
nodules or infiltrates are noted. No effusions.

ABDOMINAL CT:
FINDINGS: The liver and spleen appear intact. No focal lesions are noted. Multiple
surgical clips are noted in the gallbladder fossa compatible with prior
cholecystectomy.  The adrenals and pancreas appear intact.

Both kidneys excrete the contrast material with no masses noted in either
kidney. No intraabdominal or retroperitoneal masses are noted.  No ascites
is present.
IMPRESSION: 1)Some linear subsegmental atelectasis in the RIGHT lung base, otherwise, no
significant abnormalities are noted.

2)No significant abnormalities seen on the abdominal CT.

## 2007-10-11 ENCOUNTER — Ambulatory Visit: Payer: Self-pay | Admitting: Gastroenterology

## 2007-10-14 ENCOUNTER — Ambulatory Visit: Payer: Self-pay | Admitting: Pain Medicine

## 2007-10-18 ENCOUNTER — Ambulatory Visit: Payer: Self-pay | Admitting: Internal Medicine

## 2007-10-24 ENCOUNTER — Ambulatory Visit: Payer: Self-pay | Admitting: Pain Medicine

## 2007-10-29 IMAGING — CR DG CHEST 2V
2 series · 2 of 2 positions shown · non-contrast
Comparison: None.

CLINICAL DATA: Asthma, hypertension, short of breath.  Chest pain.
 CHEST - 2 VIEW:

[view not recorded (1 of 2)]
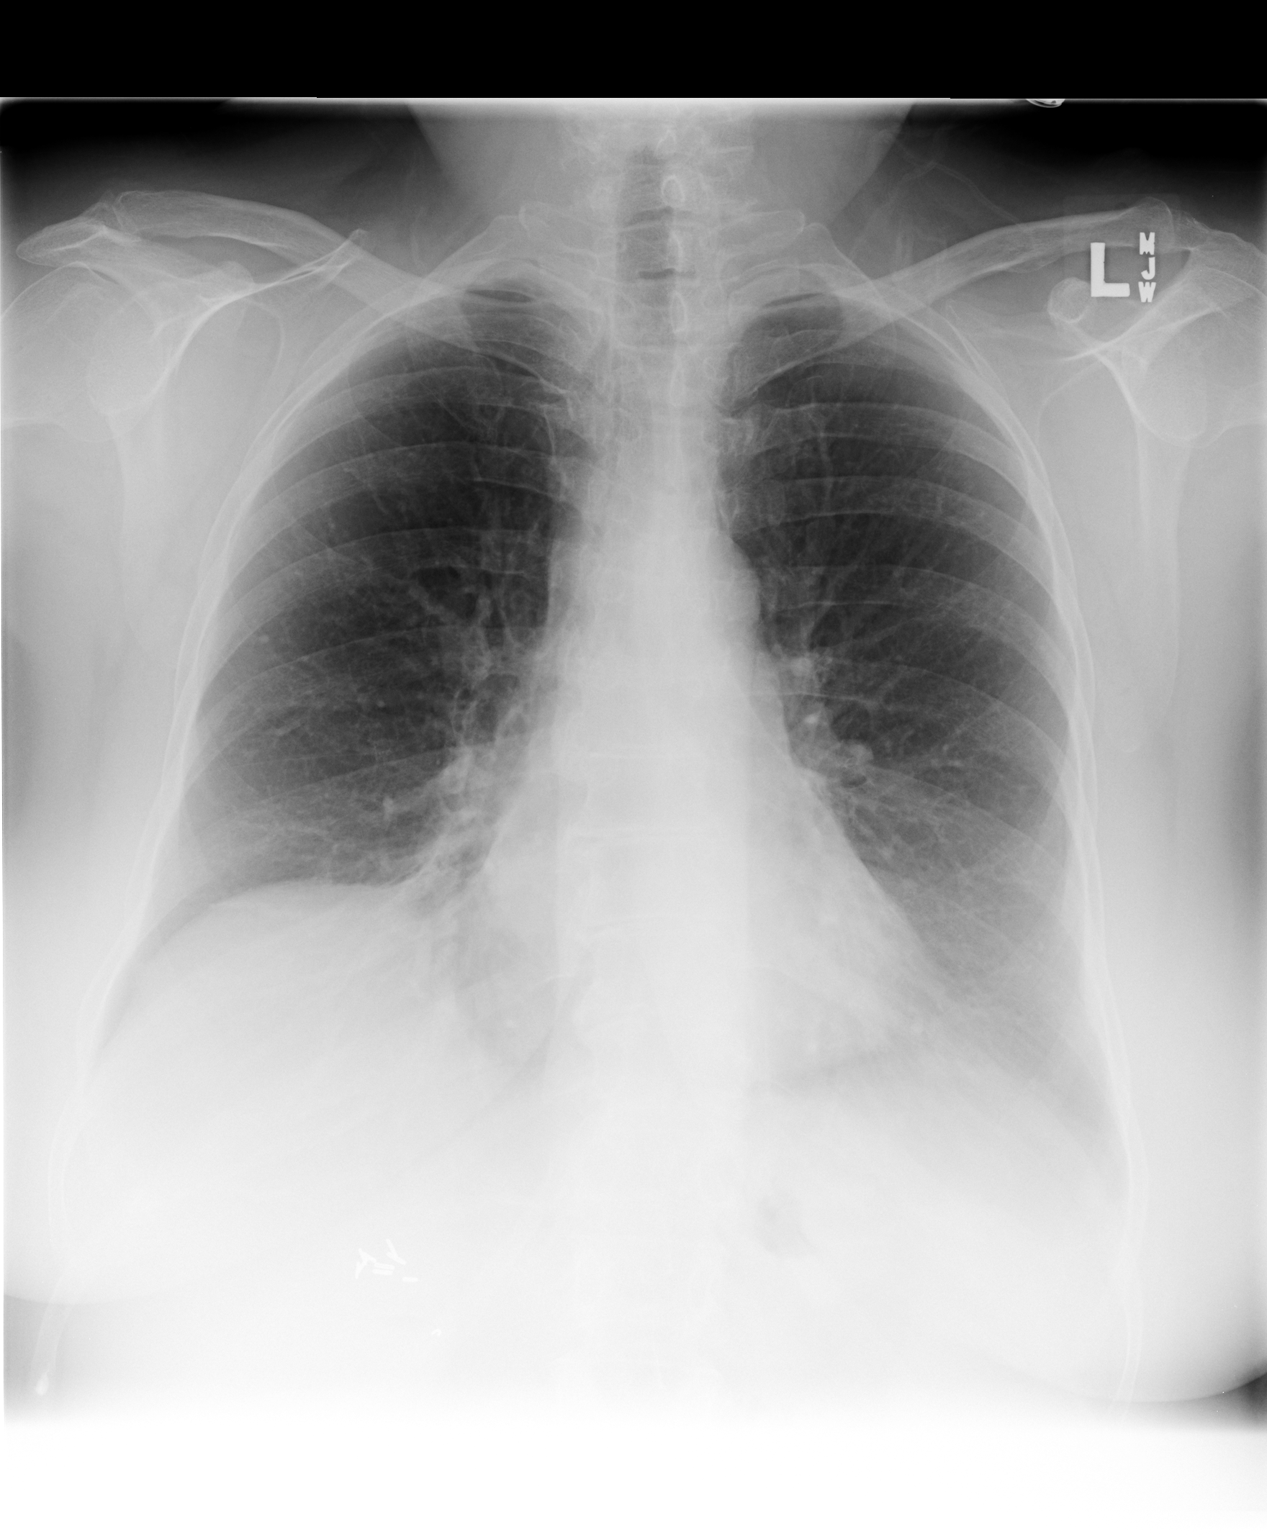

[view not recorded (2 of 2)]
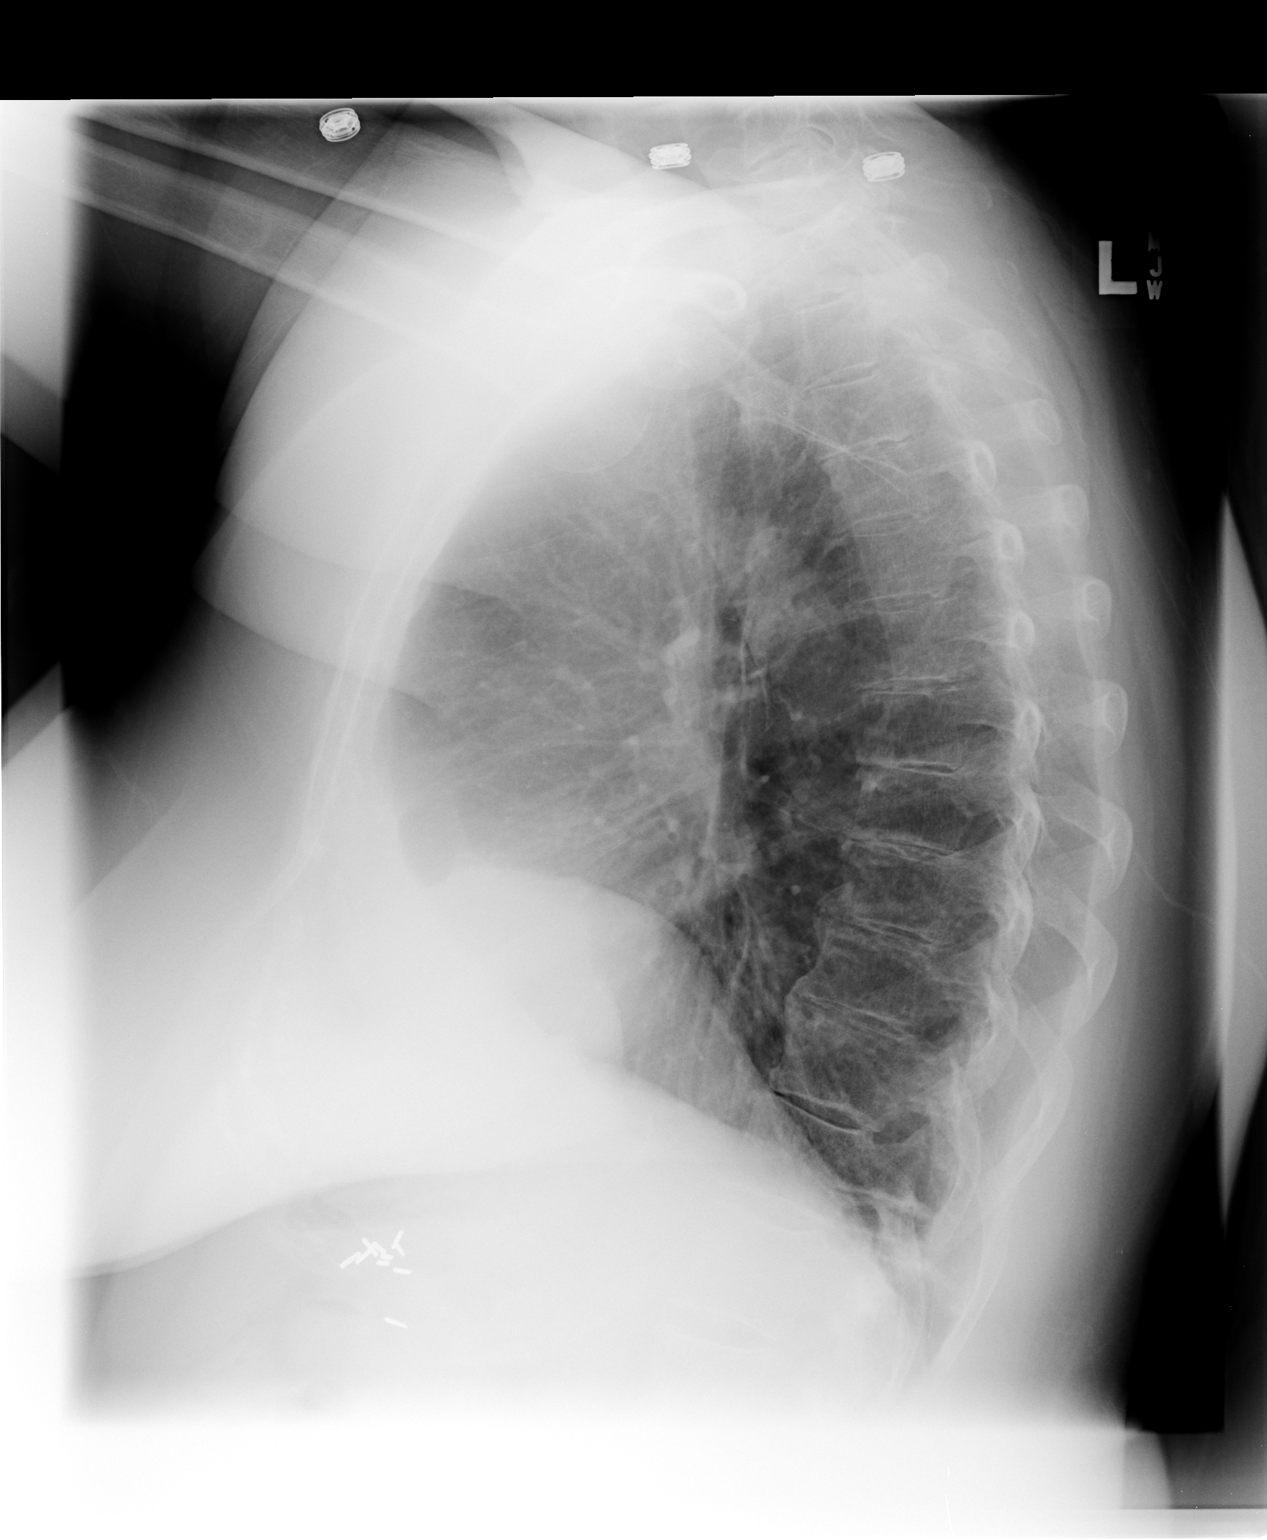

[2 of 2 positions shown; findings below may reference images not displayed]

Heart size is normal.  The right hemidiaphragm is elevated and there are changes of volume loss at the right base.  This could be chronic or active.  No effusions.  Ordinary degenerative changes affect the spine.
IMPRESSION: Elevated right hemidiaphragm with right base volume loss changes that could be chronic or active.

## 2007-11-08 ENCOUNTER — Ambulatory Visit: Payer: Self-pay | Admitting: Pain Medicine

## 2007-11-14 ENCOUNTER — Ambulatory Visit: Payer: Self-pay | Admitting: Pain Medicine

## 2007-11-25 ENCOUNTER — Emergency Department: Payer: Self-pay | Admitting: Emergency Medicine

## 2008-01-05 ENCOUNTER — Ambulatory Visit: Payer: Self-pay | Admitting: Pain Medicine

## 2008-01-10 IMAGING — CT CT HEAD WITHOUT CONTRAST
2 series · 15 of 30 positions shown, 19 images · non-contrast
Comparison: none

REASON FOR EXAM: craniotomy on 02/17/06,  has headaches
COMMENTS:

[Series 2: without · axial · non-contrast · 0.40mm/px · z∈[+788,+908]mm · 13 of 30 slices shown, 17 images]
[im 3/30  brain]
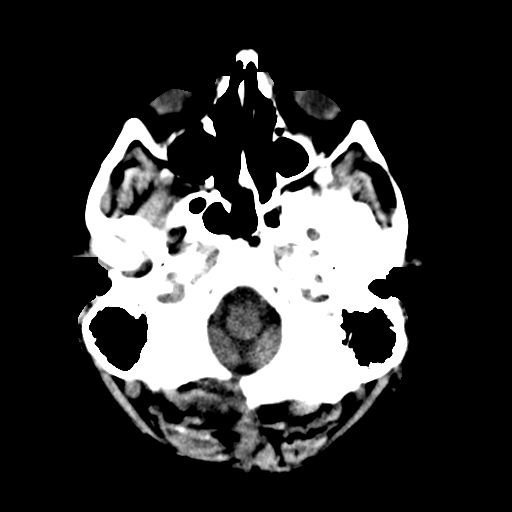
[im 3/30  bone]
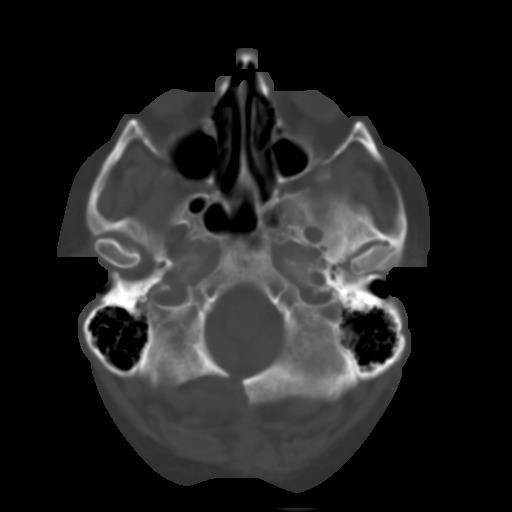
[im 5/30  brain]
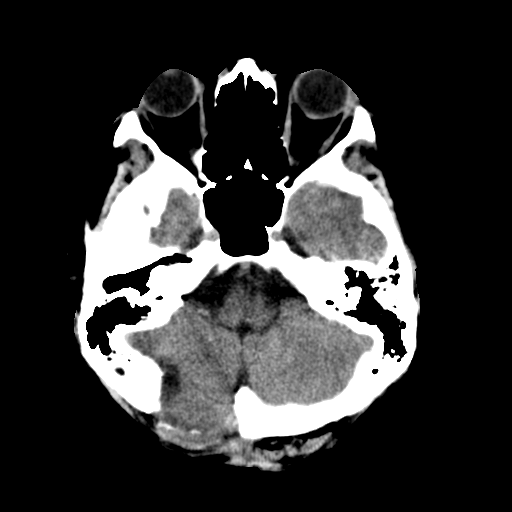
[im 7/30  brain]
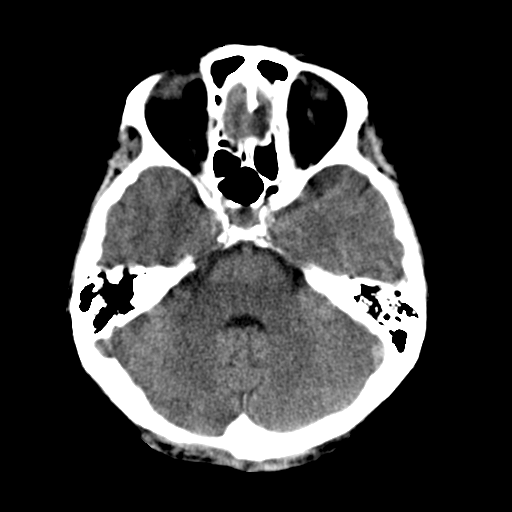
[im 9/30  brain]
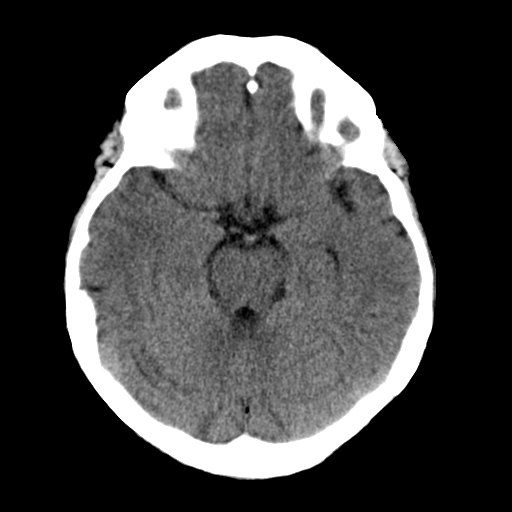
[im 11/30  brain]
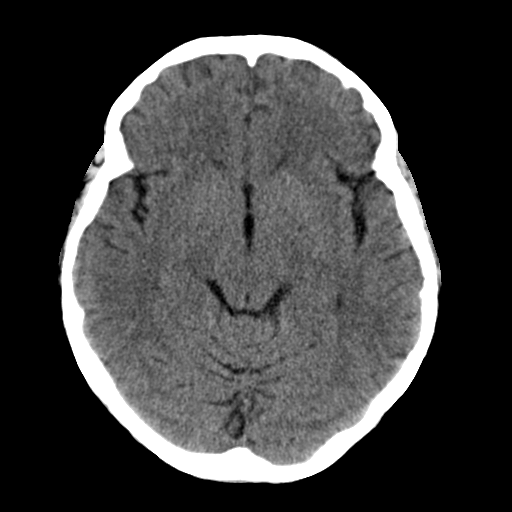
[im 11/30  bone]
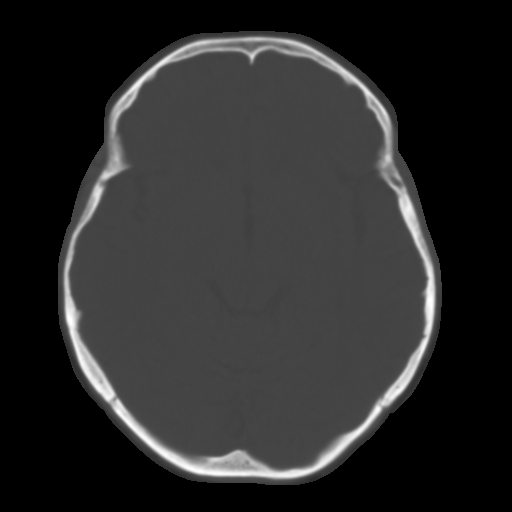
[im 13/30  brain]
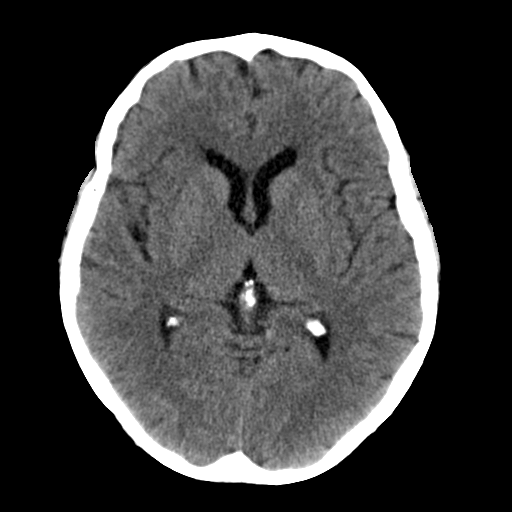
[im 15/30  brain]
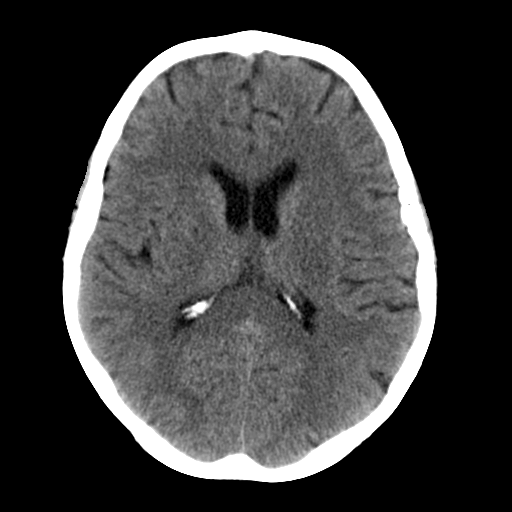
[im 17/30  brain]
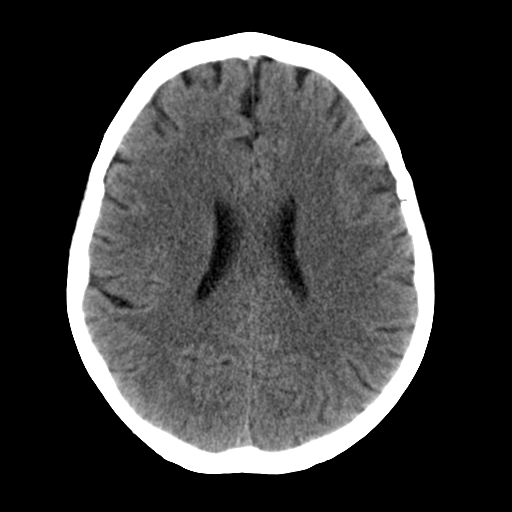
[im 19/30  brain]
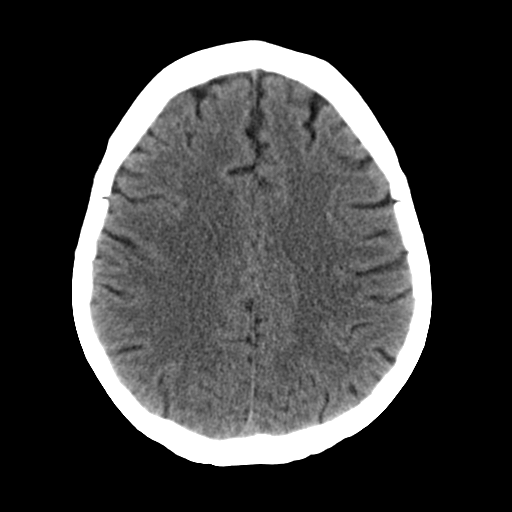
[im 19/30  bone]
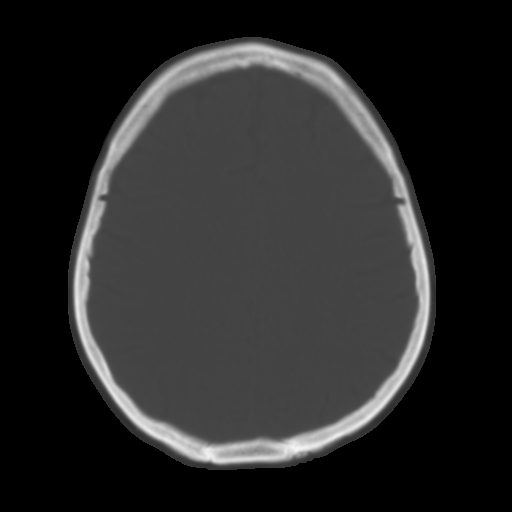
[im 21/30  brain]
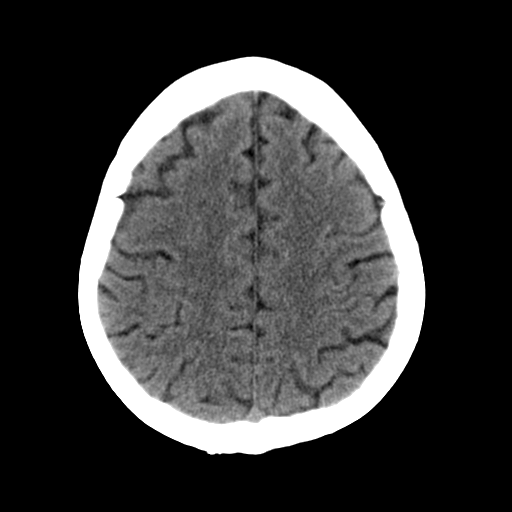
[im 23/30  brain]
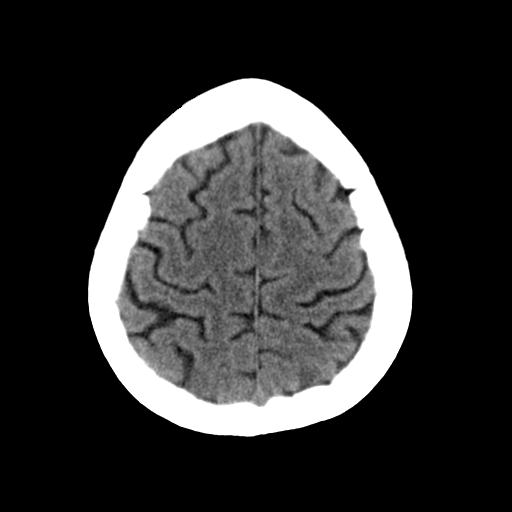
[im 25/30  brain]
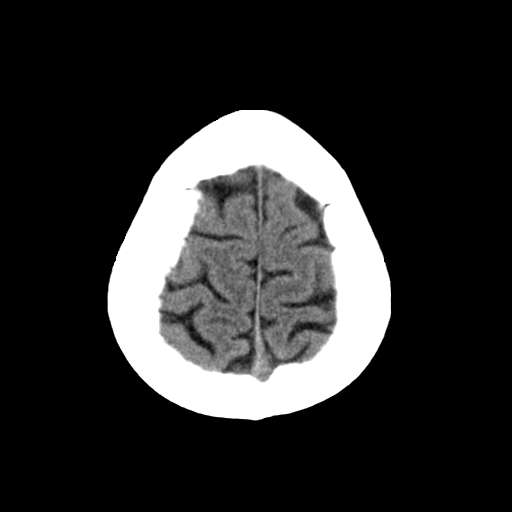
[im 27/30  brain]
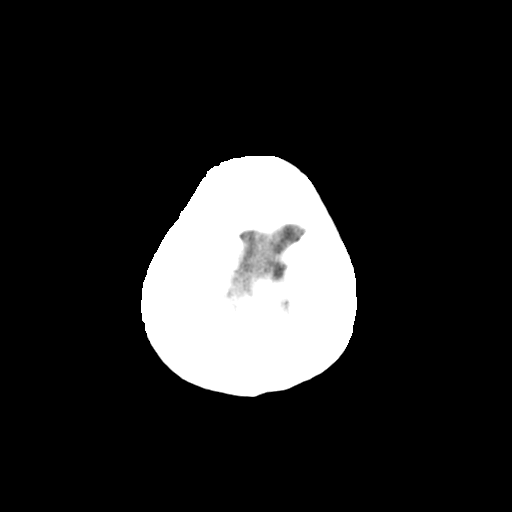
[im 27/30  bone]
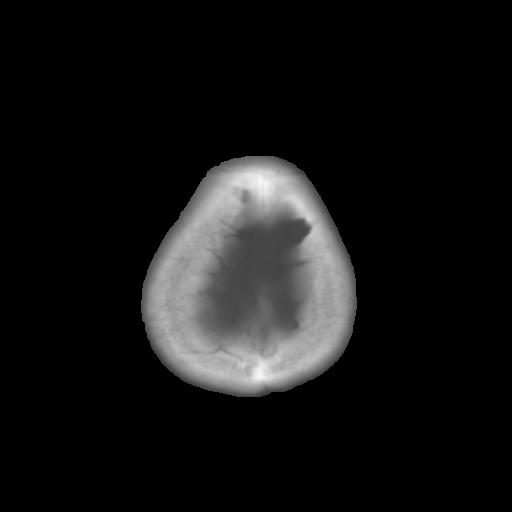

[Series 3: bone · axial · 0.40mm/px · z∈[+788,+808]mm · 2 of 30 slices shown]
[im 3/30  bone]
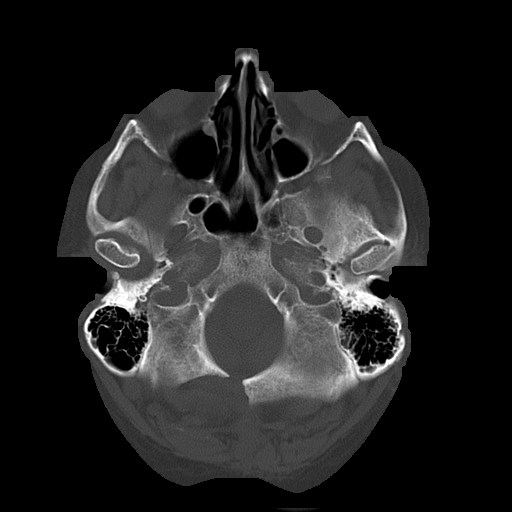
[im 7/30  bone]
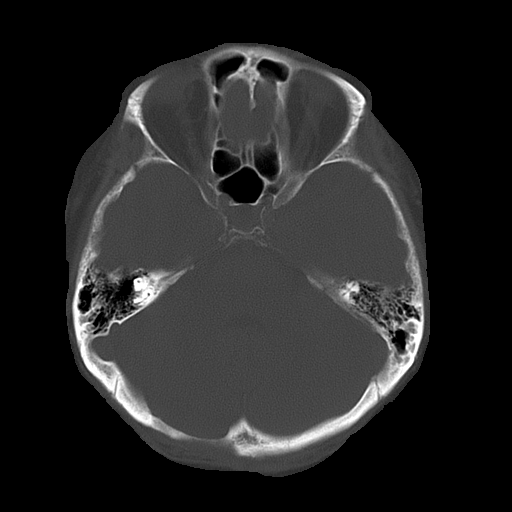

[15 of 30 positions shown; findings below may reference images not displayed]

PROCEDURE:     CT  - CT HEAD WITHOUT CONTRAST  - April 29, 2006  [DATE]

RESULT:     The patient underwent craniotomy in Tuesday February, 2006 and is
complaining of headache. The patient underwent surgical excision of a mass
in the RIGHT aspect of the posterior fossa on the prior study.

I do not see evidence of bleeding in the posterior fossa. A craniectomy has
been performed in the occipital region to the RIGHT of midline. The
ventricles are normal in size and position. No abnormal intra- nor
extraaxial fluid collections are seen. I see no intracranial hemorrhage. At
bone window settings, I see no air-fluid levels in the visualized portions
of the paranasal sinuses.
IMPRESSION: 1.     The patient has undergone resection of a mass in the posterior fossa
on the RIGHT. I do not see evidence of postoperative hemorrhage nor do I see
evidence of significant edema or other evidence of mass effect. The
ventricles are normal in size and position.
2.     I do not see findings to suggest an acute ischemic or hemorrhagic
event nor evidence of other masses within the brain.

## 2008-01-11 ENCOUNTER — Ambulatory Visit: Payer: Self-pay | Admitting: Pain Medicine

## 2008-01-23 ENCOUNTER — Ambulatory Visit: Payer: Self-pay | Admitting: Pain Medicine

## 2008-02-02 ENCOUNTER — Ambulatory Visit: Payer: Self-pay | Admitting: Pain Medicine

## 2008-02-08 ENCOUNTER — Ambulatory Visit: Payer: Self-pay | Admitting: Pain Medicine

## 2008-02-20 ENCOUNTER — Emergency Department: Payer: Self-pay | Admitting: Emergency Medicine

## 2008-03-02 ENCOUNTER — Ambulatory Visit: Payer: Self-pay | Admitting: Internal Medicine

## 2008-03-06 ENCOUNTER — Ambulatory Visit: Payer: Self-pay | Admitting: Pain Medicine

## 2008-03-12 ENCOUNTER — Ambulatory Visit: Payer: Self-pay | Admitting: Pain Medicine

## 2008-03-12 IMAGING — MR MRI HEAD WITHOUT AND WITH CONTRAST
6 of 9 series · 29 of 48 positions shown · IV contrast (agent unspecified)
Comparison: none

REASON FOR EXAM: Brain neoplasm
COMMENTS:

PROCEDURE:     MR  - MR BRAIN WO/W CONTRAST  - June 30, 2006  [DATE]
RESULT:     The patient has a history of headaches and nausea, prior history
of brain surgery.
TECHNIQUE: MRI of brain is performed without and with contrast.
Comparison is made to prior MRI of 01/21/2006.

[Series 2: T1 · axial · 10.0mm · 0.55mm/px · z∈[+0,+95]mm · 6 of 20 slices shown (1 of 2)]
[im 1/20]
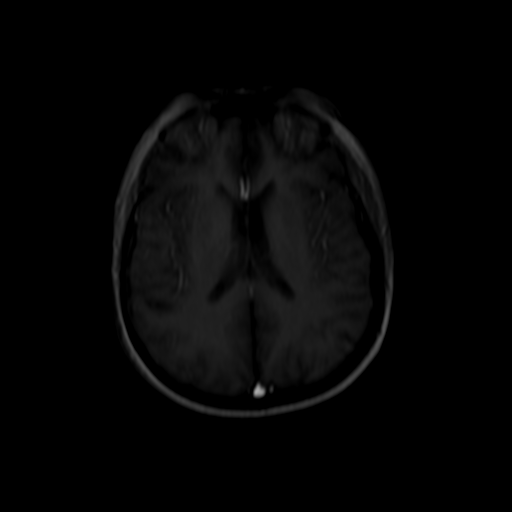
[im 4/20]
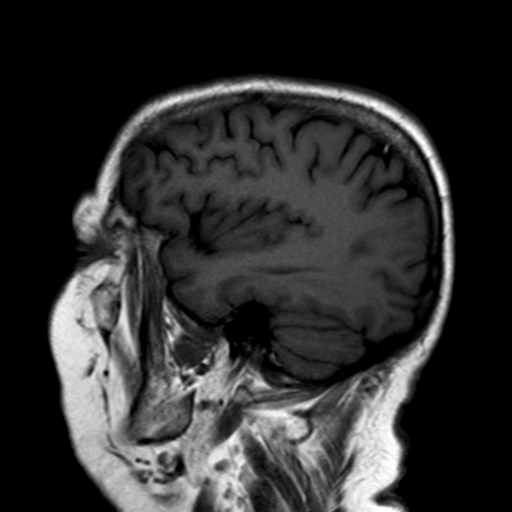
[im 8/20]
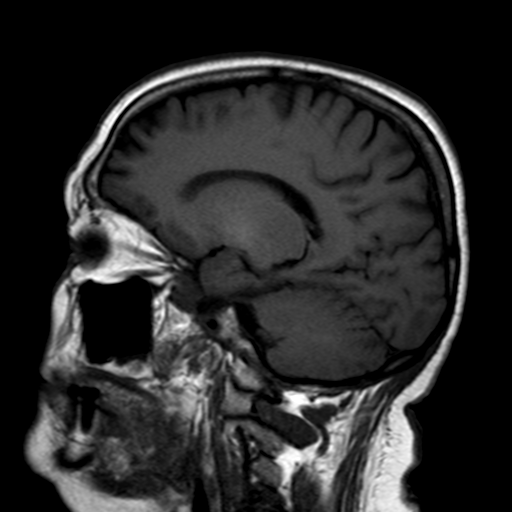
[im 12/20]
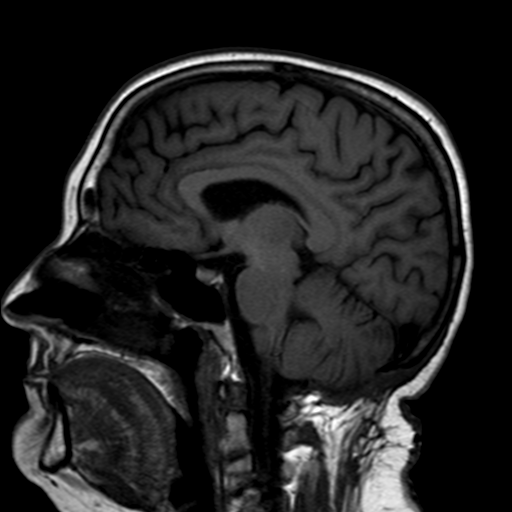
[im 16/20]
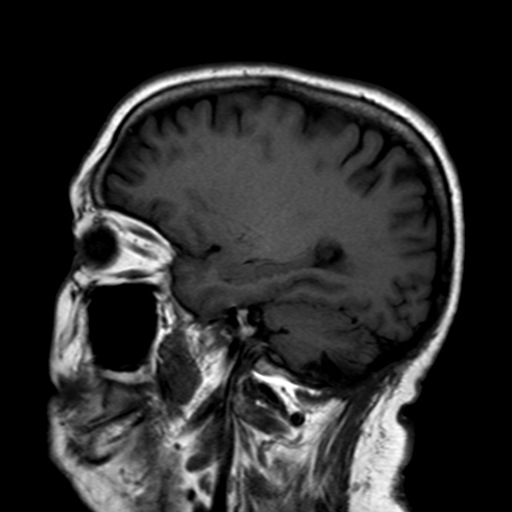
[im 20/20]
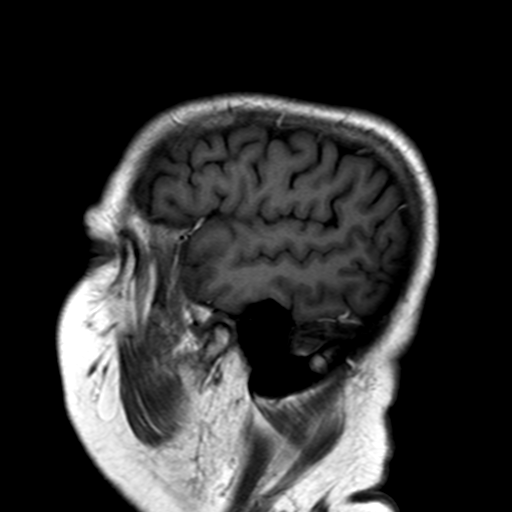

[Series 12: T2 · axial · 5.0mm · 0.60mm/px · z∈[-46,+95]mm · 5 of 24 slices shown]
[im 1/24]
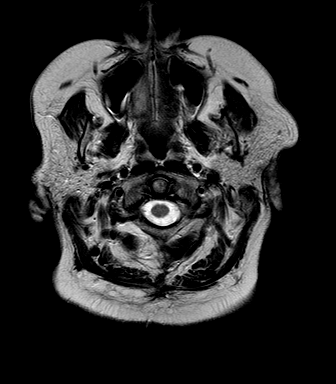
[im 6/24]
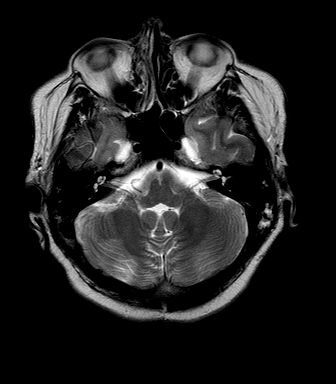
[im 12/24]
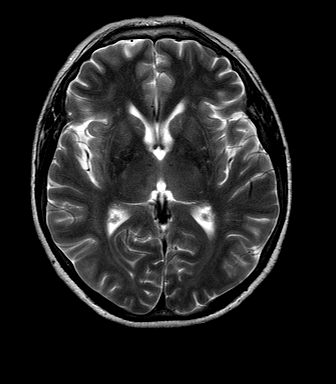
[im 18/24]
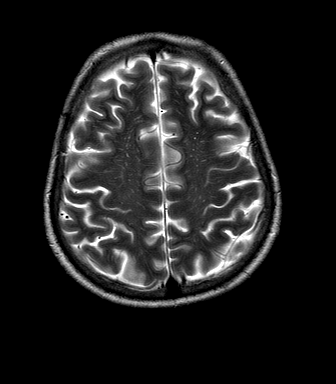
[im 24/24]
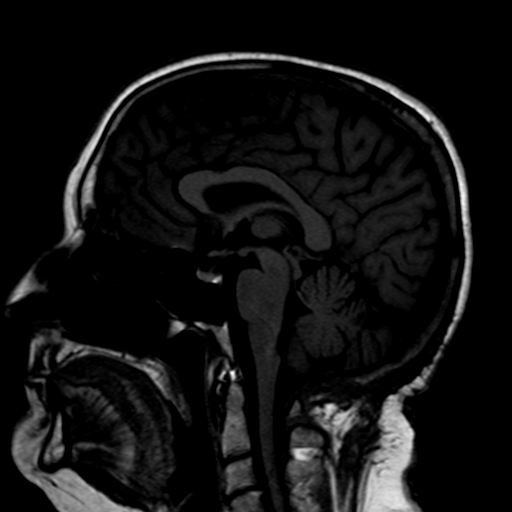

[Series 14: FLAIR · axial · 5.0mm · 0.45mm/px · z∈[-46,+95]mm · 5 of 24 slices shown]
[im 1/24]
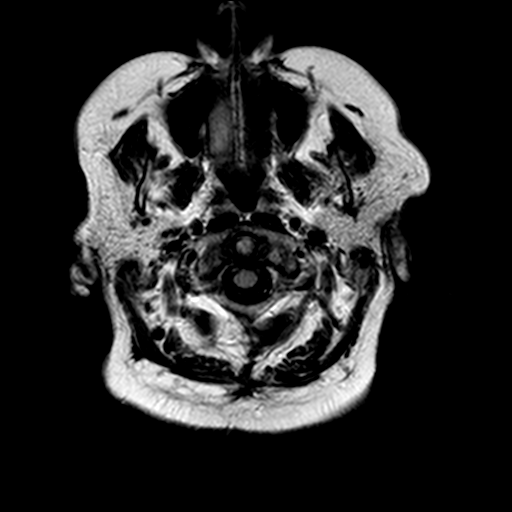
[im 6/24]
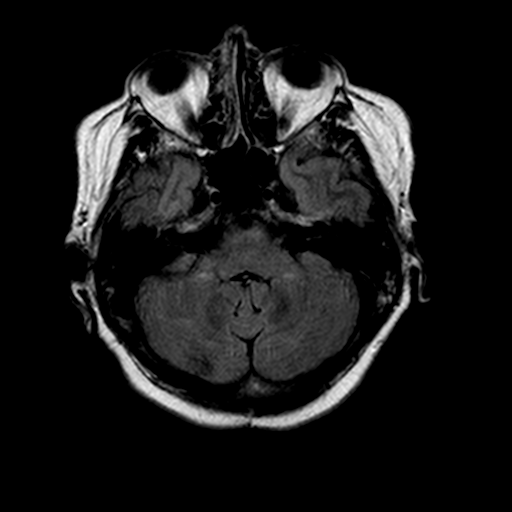
[im 12/24]
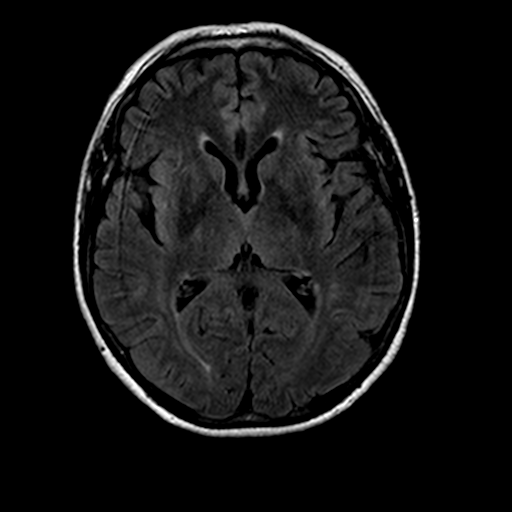
[im 18/24]
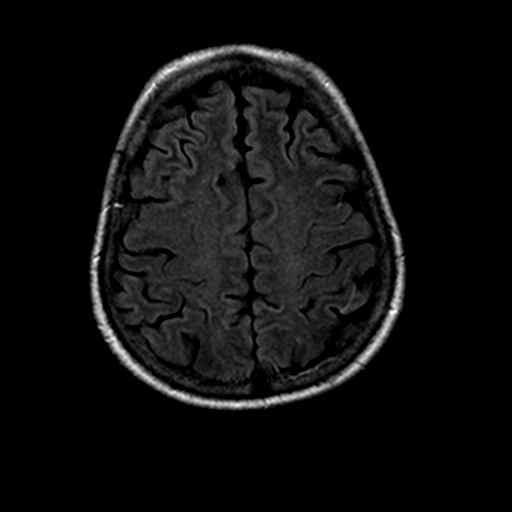
[im 24/24]
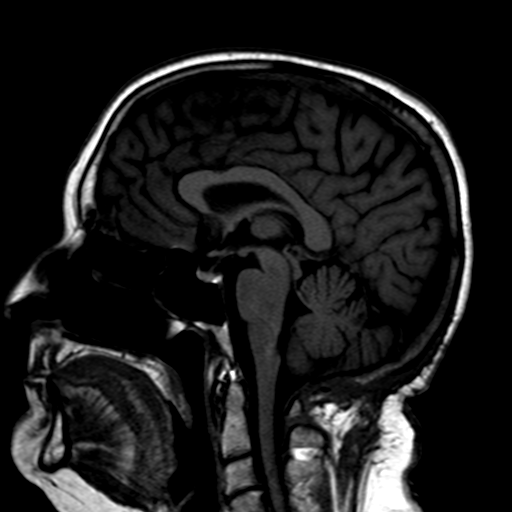

[Series 16: T1 · axial · 5.0mm · 0.60mm/px · z∈[-3,+71]mm · 3 of 26 slices shown (2 of 2)]
[im 1/26]
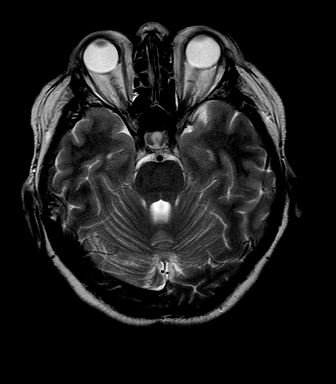
[im 6/26]
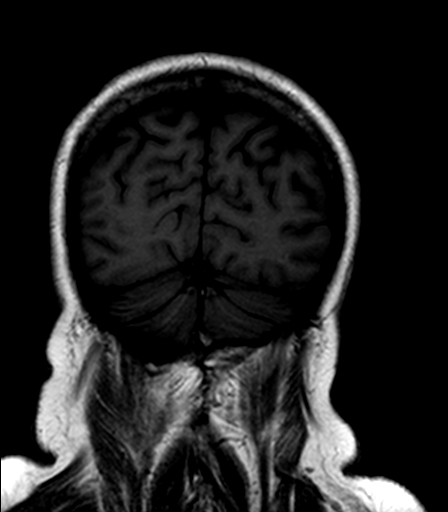
[im 11/26]
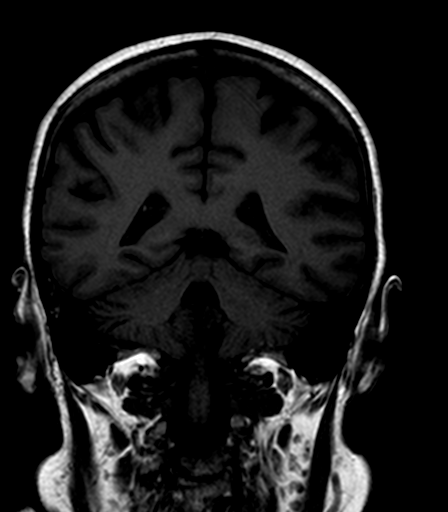

[Series 5002: DWI · axial · 5.0mm · 1.80mm/px · z∈[-39,+95]mm · 5 of 22 slices shown]
[im 1/22]
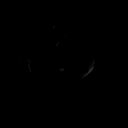
[im 6/22]
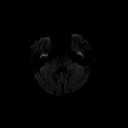
[im 11/22]
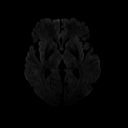
[im 16/22]
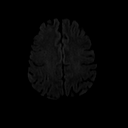
[im 22/22]
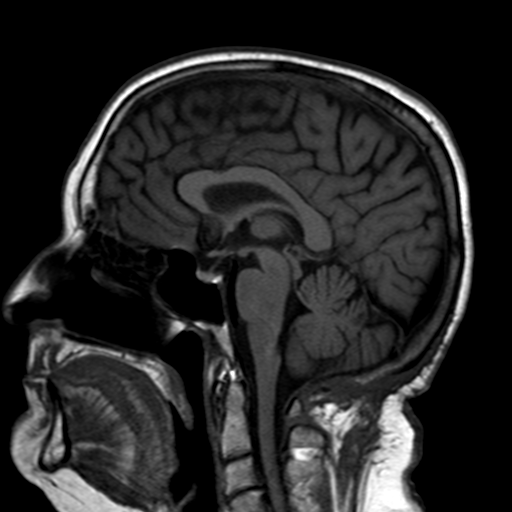

[Series 5003: ADC · axial · 5.0mm · 1.80mm/px · z∈[-39,+95]mm · 5 of 21 slices shown]
[im 1/21]
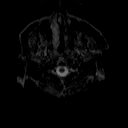
[im 6/21]
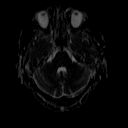
[im 11/21]
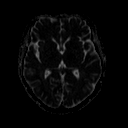
[im 16/21]
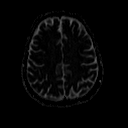
[im 21/21]
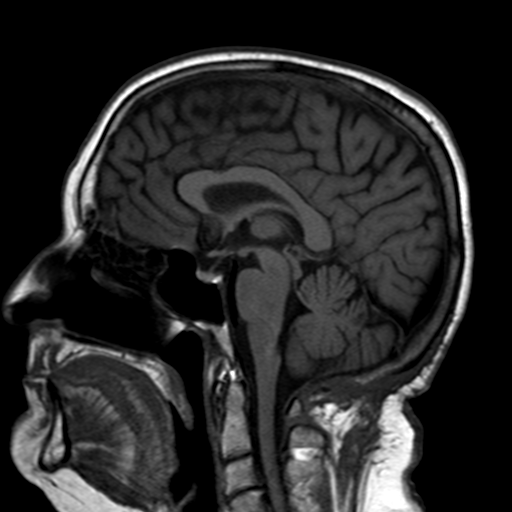

[29 of 48 positions shown; findings below may reference images not displayed]

FINDINGS: Diffusion weighted images reveal no evidence of acute ischemia.
The previously identified, enhancing RIGHT cerebellar hemisphere mass
appears to have been removed. A surgical defect is noted in this region with
encephalomalacia. Minimal adjacent enhancement cannot be entirely excluded
and repeat follow-up MRI of brain is suggested in 3-6 months. No other mass
lesions are noted. Subtle deep white matter changes are noted consistent
with chronic ischemia.
IMPRESSION: 1.     Interval removal of RIGHT cerebellar hemisphere mass lesion with mild
encephalomalacia at the site of the surgical removal. Minimal enhancement at
the surgical site is also present. It is suggested follow-up MRI of brain
again be obtained in 3-6 months; however, it appears that the majority of
the lesion has been surgically removed.
2.     No other focal abnormalities are identified. Chronic changes of small
vessel ischemia are noted.

## 2008-04-04 ENCOUNTER — Ambulatory Visit: Payer: Self-pay | Admitting: Pain Medicine

## 2008-04-09 IMAGING — CR RIGHT HIP - COMPLETE 2+ VIEW
1 series · 2 of 2 positions shown · non-contrast
Comparison: none

REASON FOR EXAM: RIGHT HIP PAIN
COMMENTS:

PROCEDURE:     DXR - DXR HIP RIGHT COMPLETE  - July 28, 2006 [DATE]
RESULT:       AP and frog-leg lateral views of the RIGHT hip were obtained.
No fracture, dislocation or other acute bony abnormality is seen.  The hip
joint space is well maintained.  No lytic or blastic lesions are seen.

[Series 1: view not recorded · 0.17mm/px · 2 of 2 slices shown]
[im 1/2]
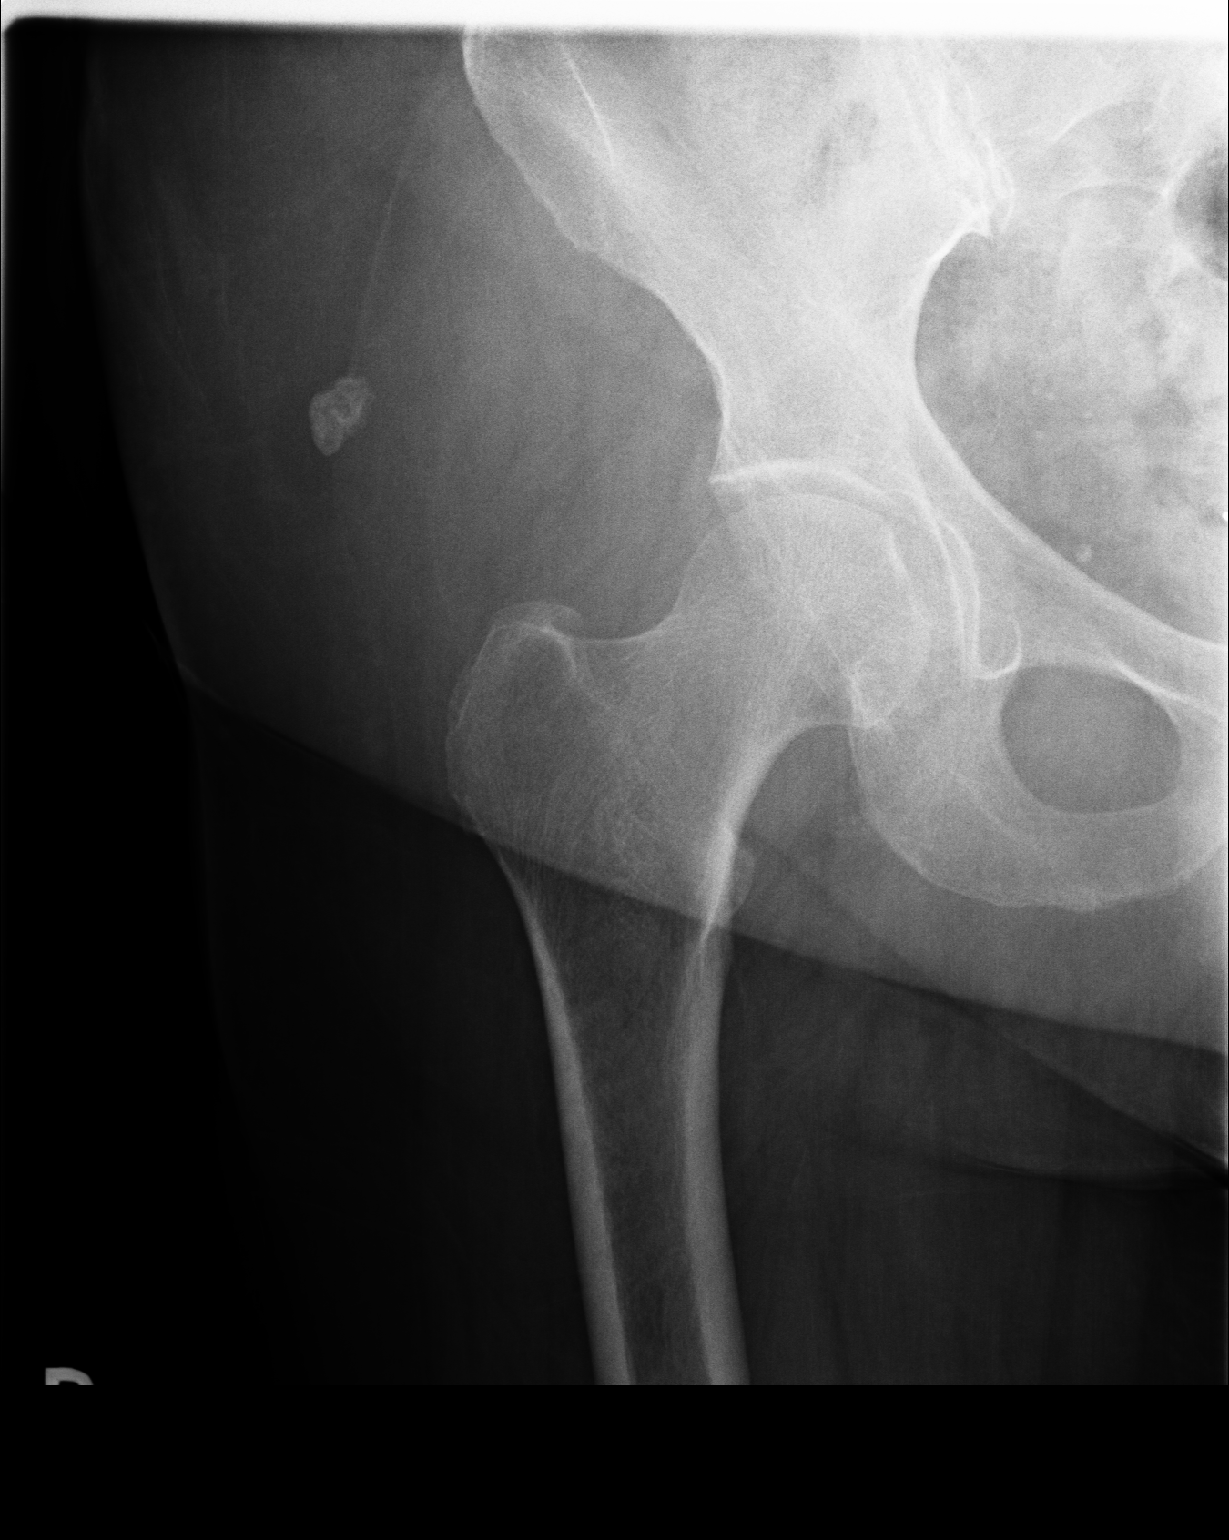
[im 2/2]
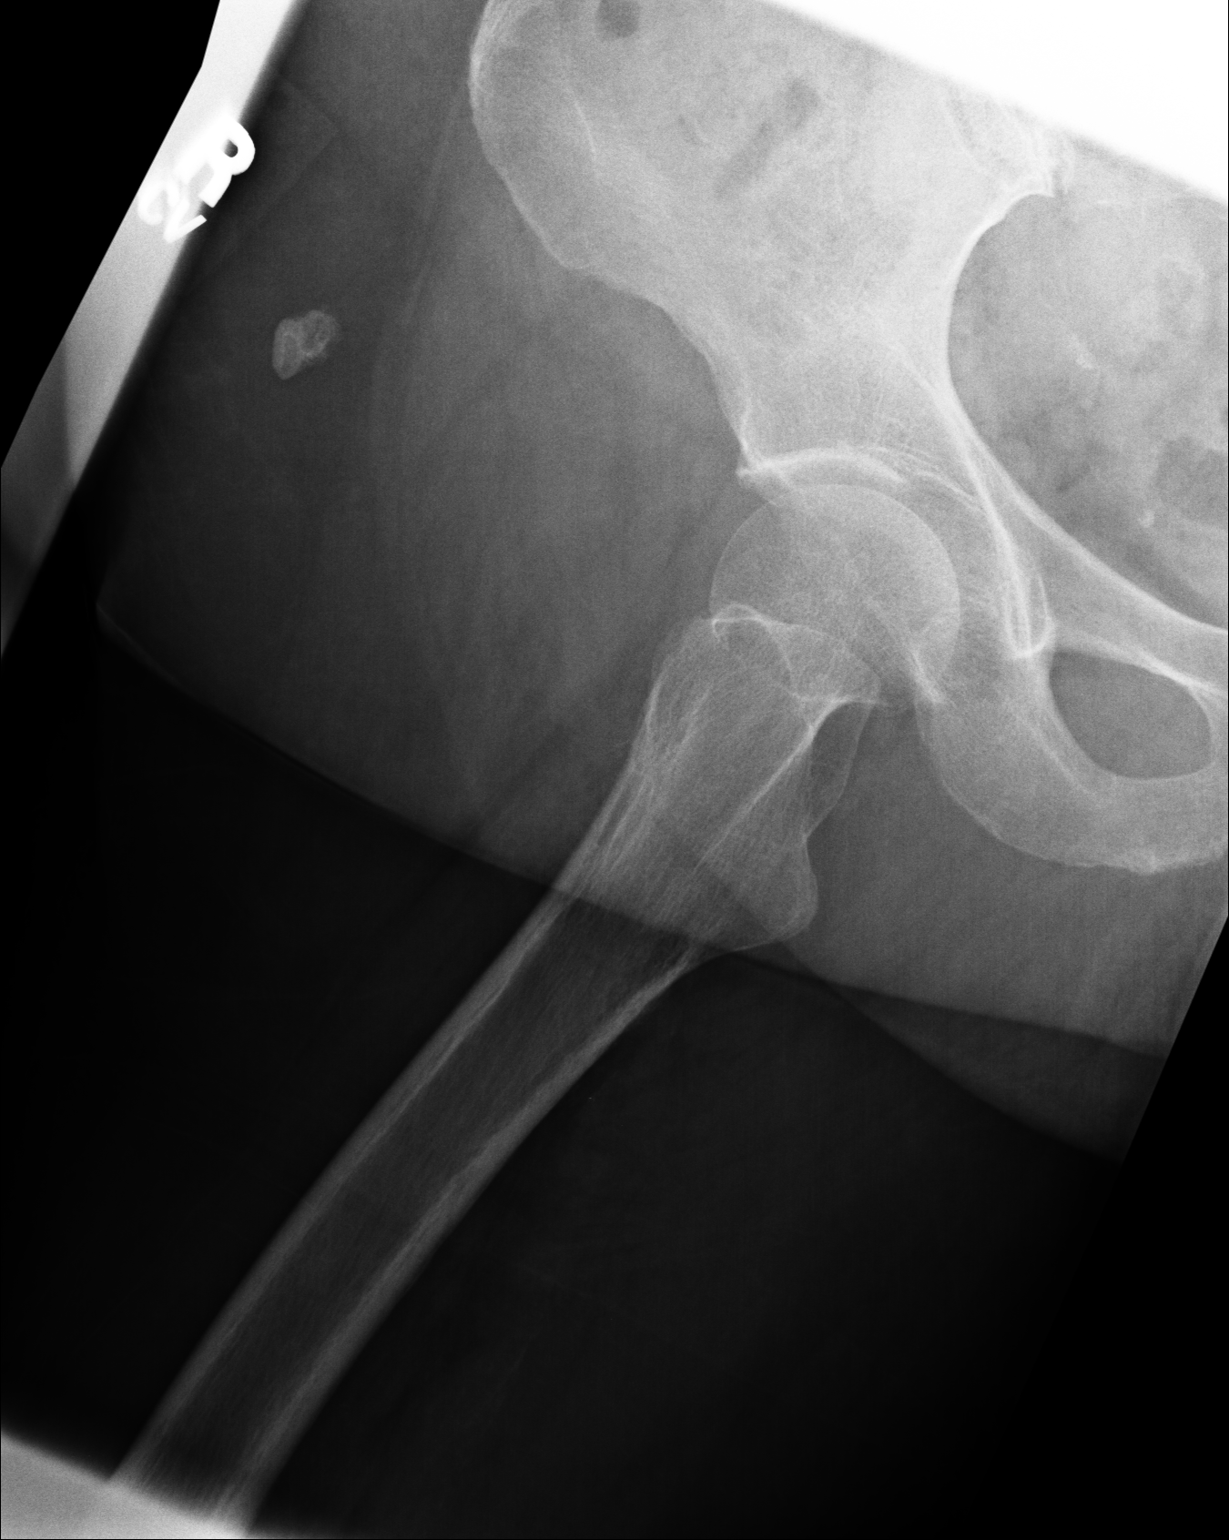

[2 of 2 positions shown; findings below may reference images not displayed]

IMPRESSION: No significant abnormalities are noted.

## 2008-04-11 ENCOUNTER — Ambulatory Visit: Payer: Self-pay | Admitting: Pain Medicine

## 2008-05-08 ENCOUNTER — Ambulatory Visit: Payer: Self-pay | Admitting: Pain Medicine

## 2008-05-16 ENCOUNTER — Ambulatory Visit: Payer: Self-pay | Admitting: Pain Medicine

## 2008-05-18 ENCOUNTER — Emergency Department: Payer: Self-pay | Admitting: Emergency Medicine

## 2008-06-05 ENCOUNTER — Ambulatory Visit: Payer: Self-pay | Admitting: Pain Medicine
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
## 2008-08-04 ENCOUNTER — Inpatient Hospital Stay: Payer: Self-pay | Admitting: Internal Medicine

## 2008-11-01 ENCOUNTER — Ambulatory Visit: Payer: Self-pay | Admitting: Internal Medicine

## 2008-11-13 IMAGING — US US EXTREM LOW VENOUS*R*
1 series · 17 of 19 positions shown · non-contrast
Comparison: none

REASON FOR EXAM: pain swelling red Lower Ex  Call Report [phone] or
[phone]
COMMENTS:

[Series 1: us extrem low venous*right* · 17 of 19 slices shown]
[im 1/19]
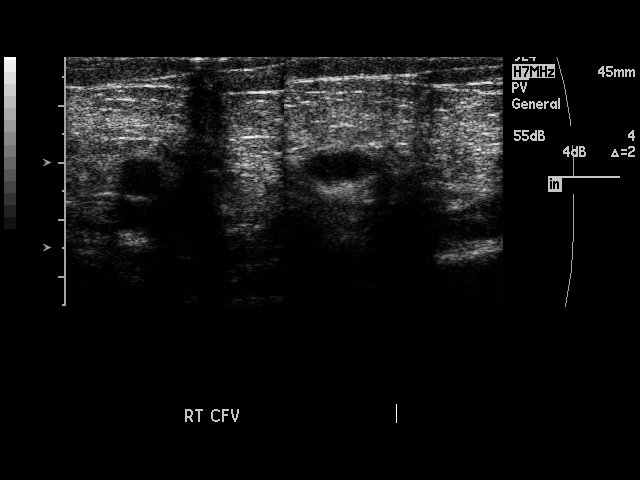
[im 2/19]
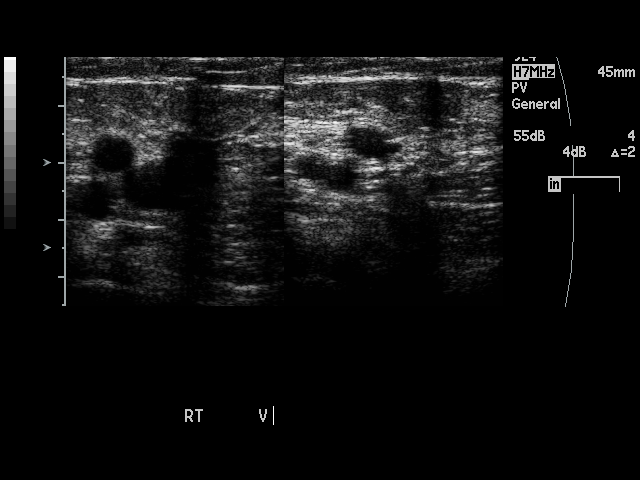
[im 3/19]
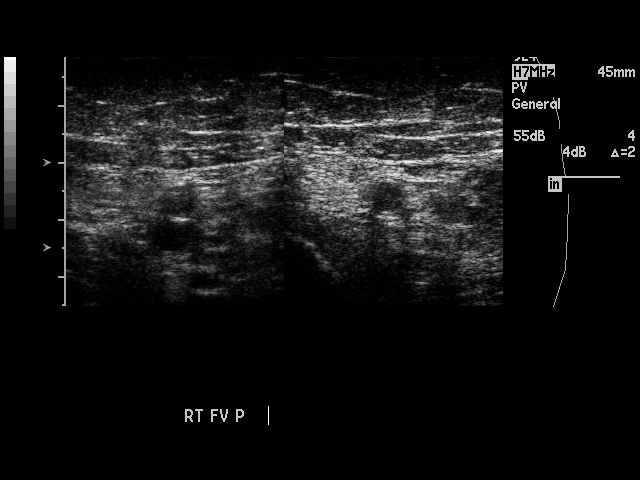
[im 4/19]
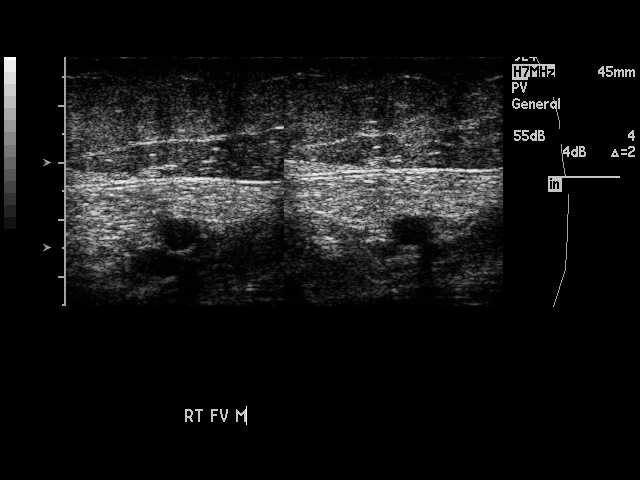
[im 6/19]
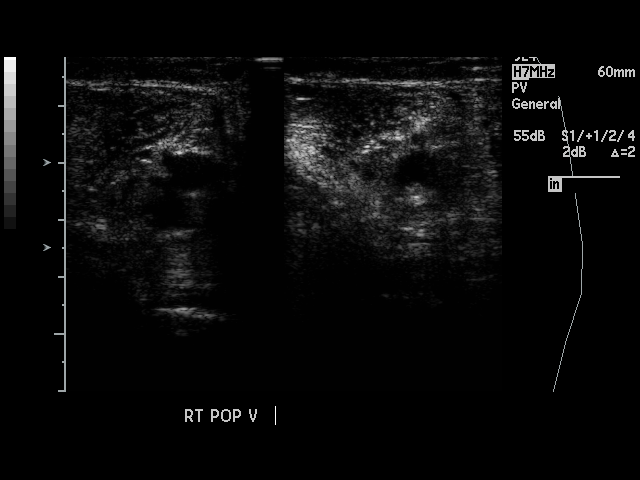
[im 7/19]
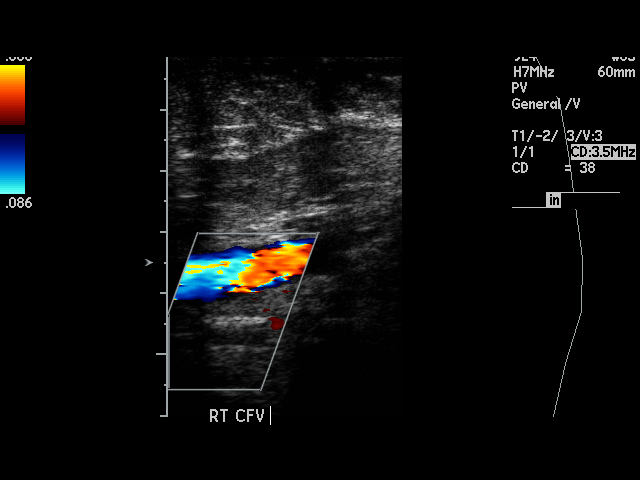
[im 8/19]
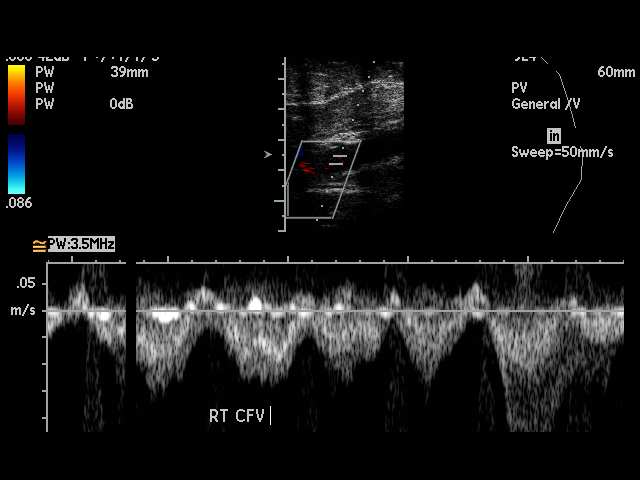
[im 9/19]
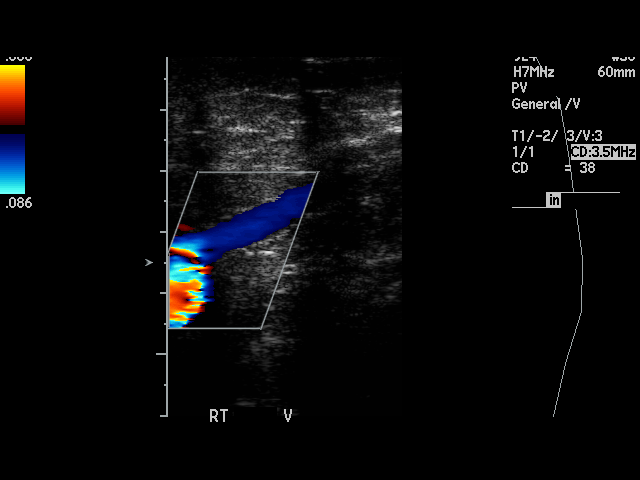
[im 10/19]
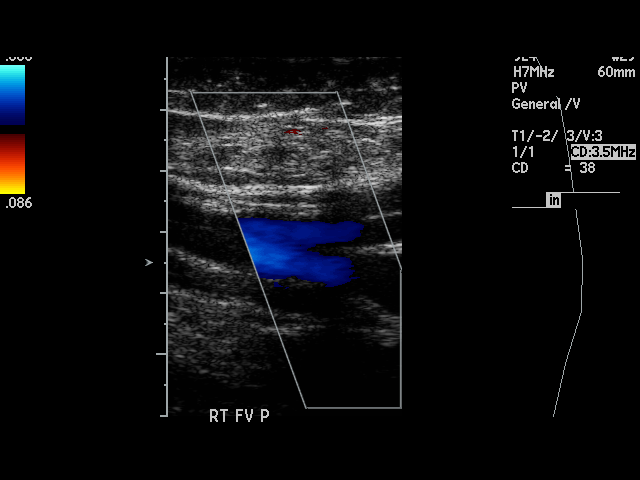
[im 11/19]
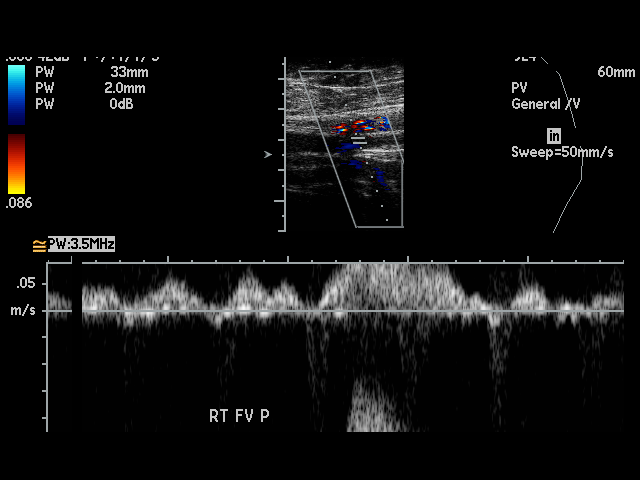
[im 12/19]
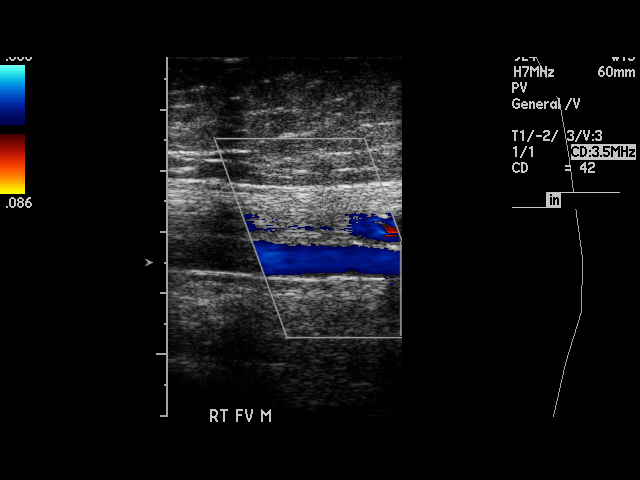
[im 13/19]
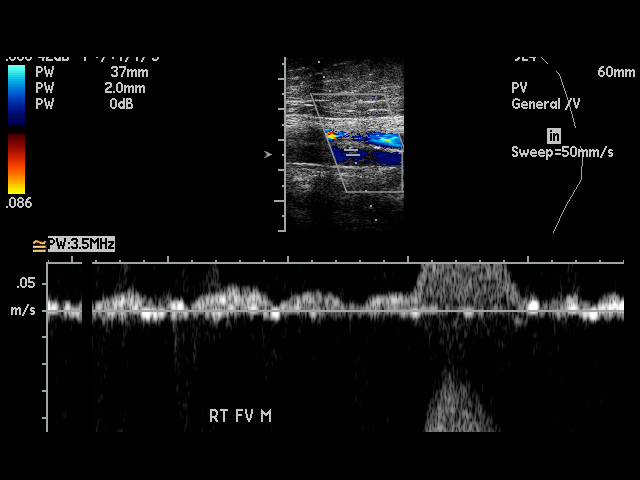
[im 14/19]
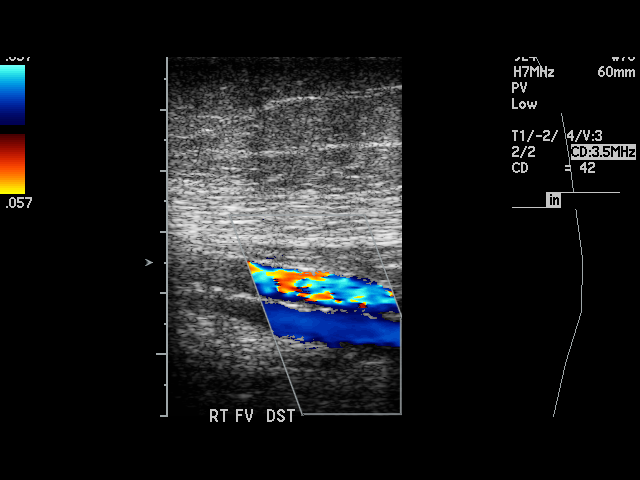
[im 16/19]
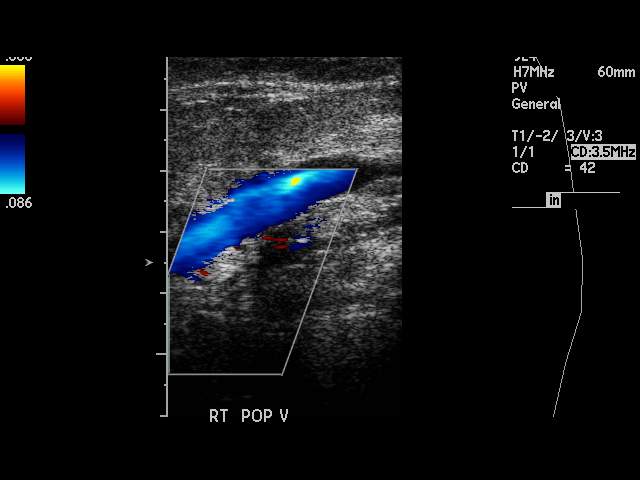
[im 17/19]
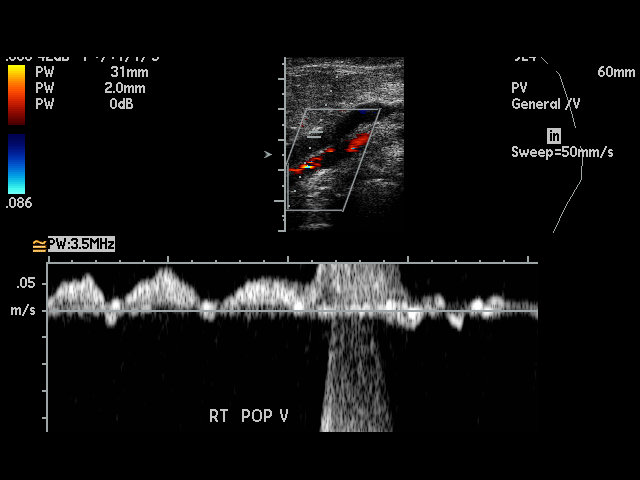
[im 18/19]
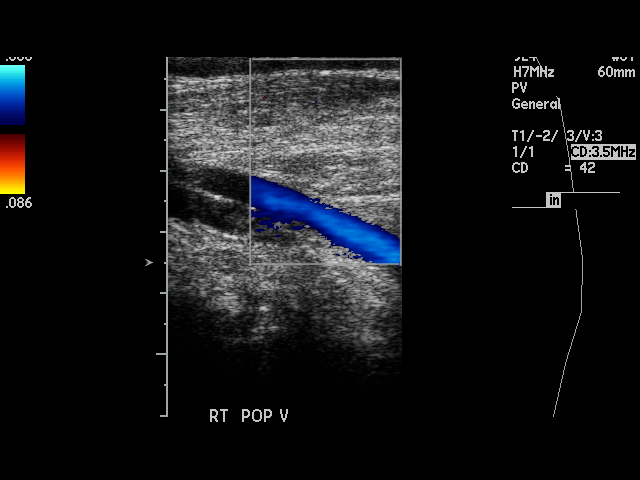
[im 19/19]
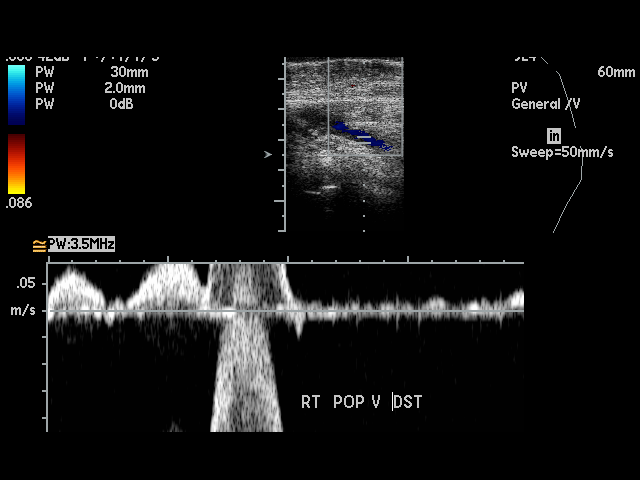

[17 of 19 positions shown; findings below may reference images not displayed]

PROCEDURE:     US  - US DOPPLER LOW EXTR RIGHT  - March 03, 2007  [DATE]

RESULT:     The phasic and augmentation flow waveforms are normal. The RIGHT
femoral and popliteal vein shows normal compressibility. Doppler examination
shows no occlusion or deep venous thrombosis. Normal Doppler signal is also
seen in the upper saphenous vein.
IMPRESSION: 1.     Normal study. No deep vein thrombosis is identified in the RIGHT leg.

## 2008-12-03 IMAGING — CR SACRUM AND COCCYX - 2+ VIEW
1 series · 4 of 4 positions shown · non-contrast
Comparison: none

REASON FOR EXAM: Fall, pain
            Call report if abnormal: [phone]
COMMENTS:

[Series 1: view not recorded · 0.17mm/px · 4 of 4 slices shown]
[im 1/4]
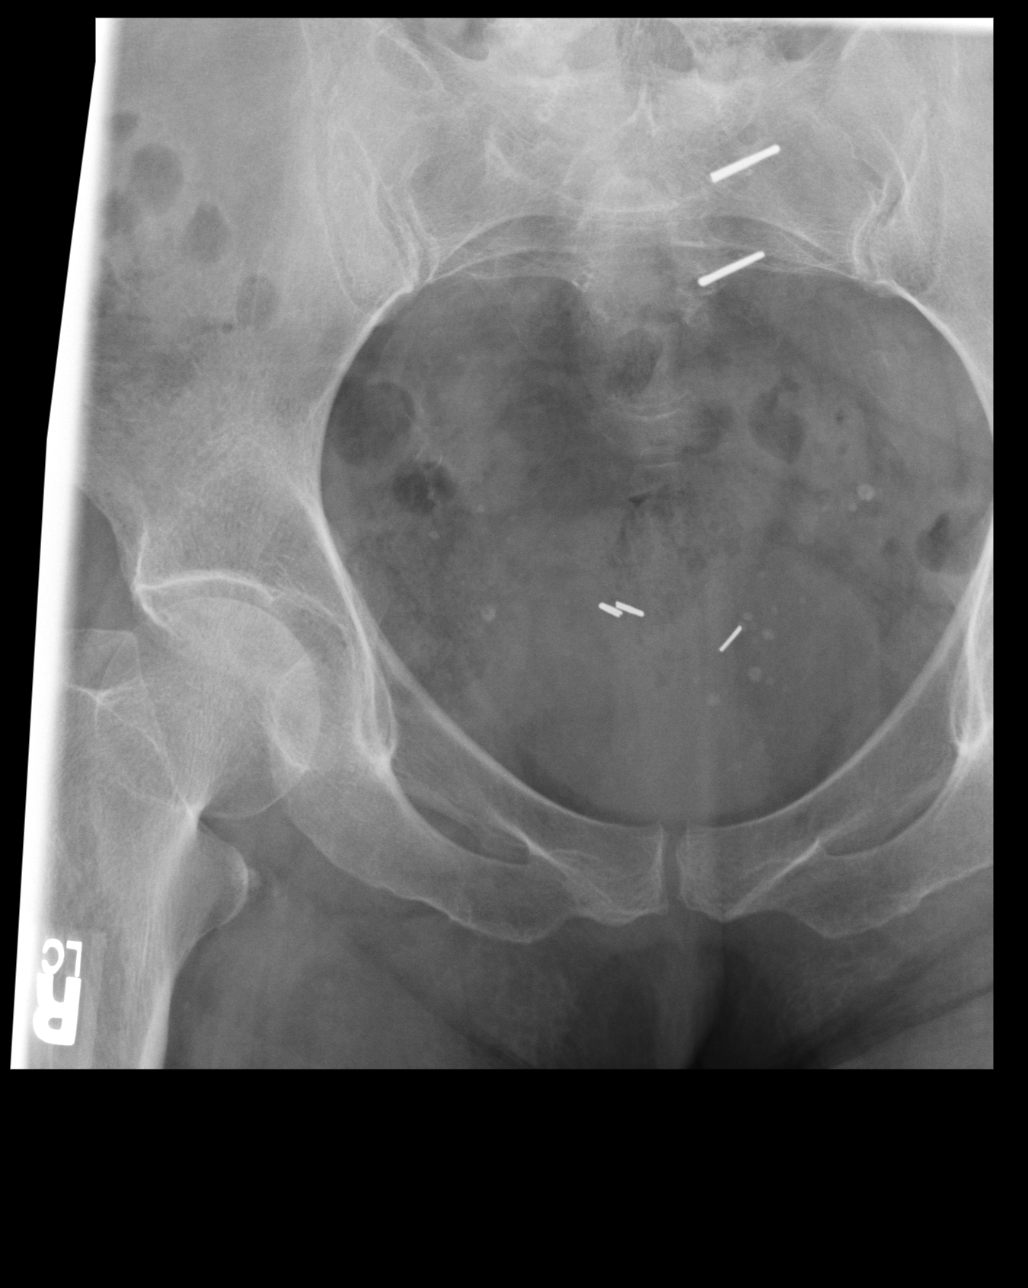
[im 2/4]
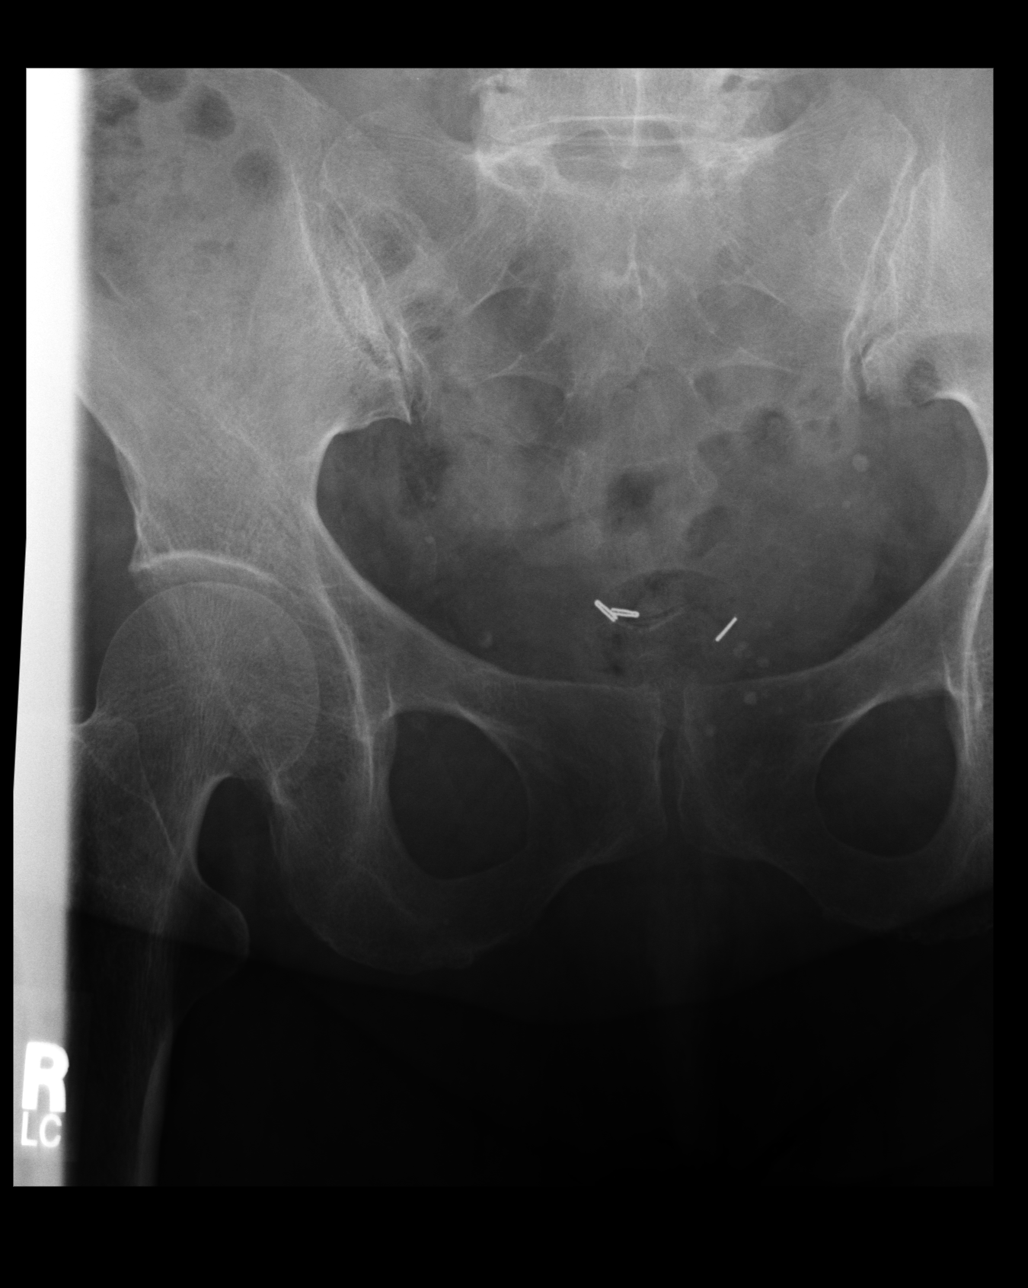
[im 3/4]
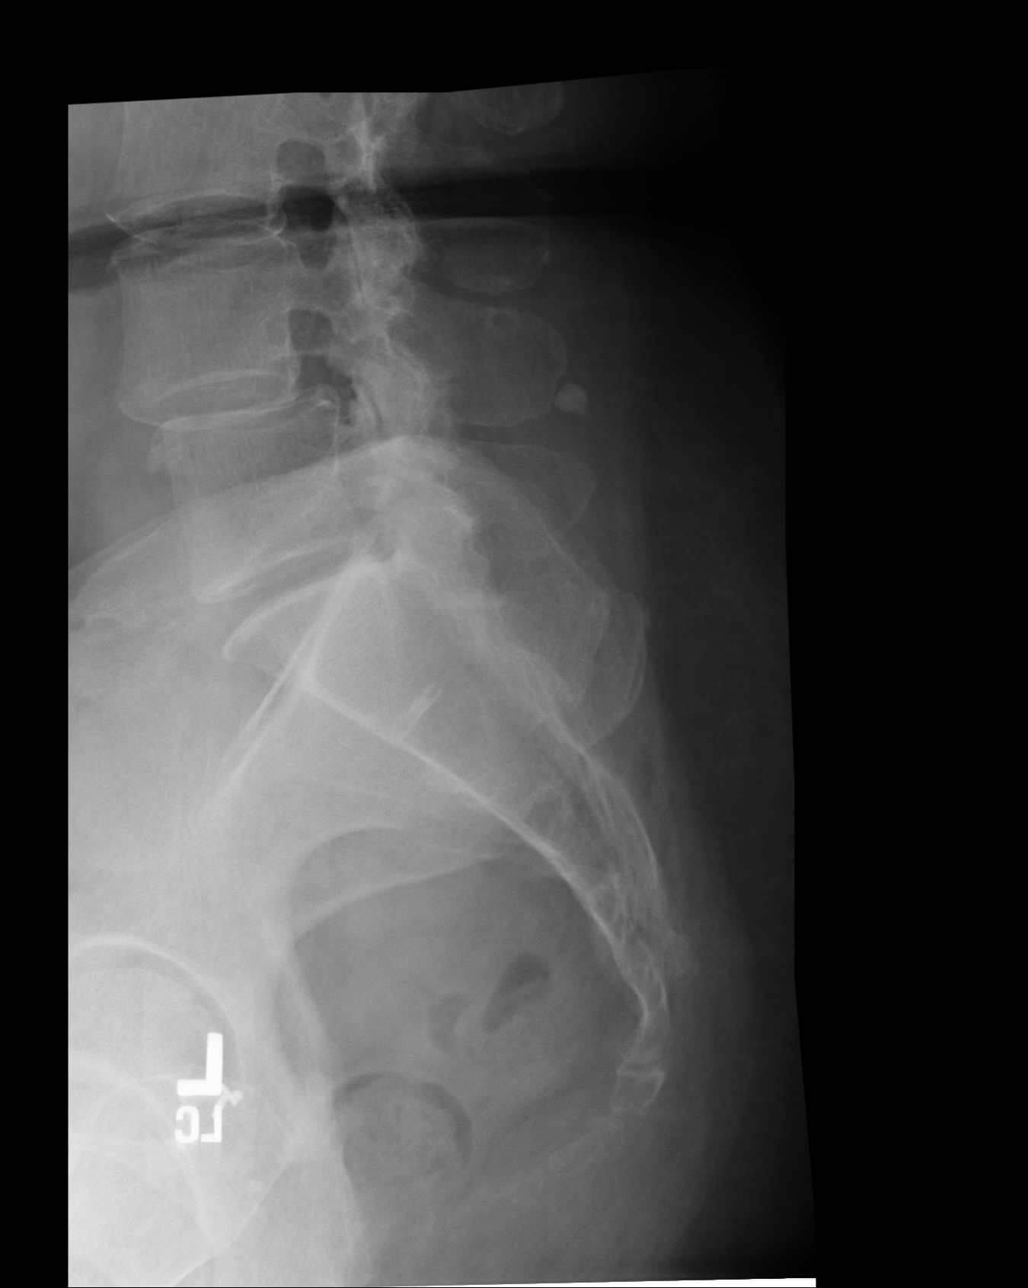
[im 4/4]
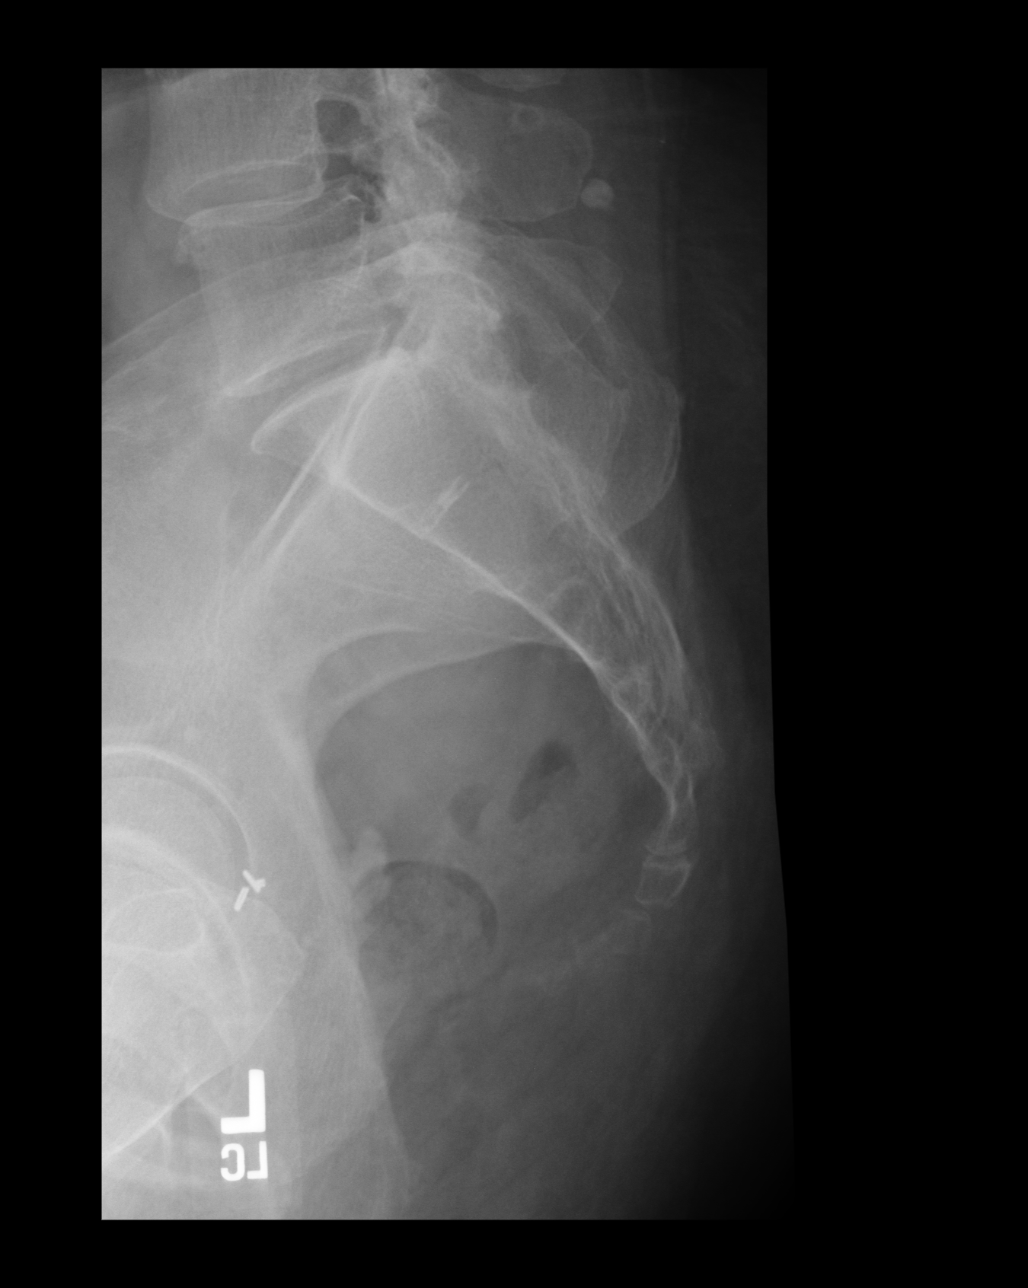

[4 of 4 positions shown; findings below may reference images not displayed]

PROCEDURE:     DXR - DXR SACRUM AND COCCYX  - March 23, 2007  [DATE]

RESULT:     AP and lateral views of the sacrum and coccyx show no fracture
or other acute bony abnormality. There is noted narrowing of the L4-5
intervertebral disc space consistent with disc disease. A 6.0 mm
anterolisthesis of L4 on L5 is also noted and consistent with degenerative
change. No fracture is observed.
IMPRESSION: 1.  No fracture or other acute bony abnormality of the sacrum and coccyx is
seen.
2.  There is narrowing of the L4-5 intervertebral disc space with associated
spondylolisthesis as noted above.

## 2008-12-18 ENCOUNTER — Ambulatory Visit: Payer: Self-pay | Admitting: Internal Medicine

## 2009-02-26 ENCOUNTER — Ambulatory Visit (HOSPITAL_COMMUNITY): Admission: RE | Admit: 2009-02-26 | Discharge: 2009-02-26 | Payer: Self-pay | Admitting: Rheumatology

## 2009-03-20 ENCOUNTER — Emergency Department: Payer: Self-pay | Admitting: Emergency Medicine

## 2009-03-23 IMAGING — MR MRI HEAD WITHOUT AND WITH CONTRAST
9 series · 48 of 48 positions shown · non-contrast
Comparison: none

REASON FOR EXAM: brain tumor f/u
COMMENTS:

[Series 2: t1_se_sag · axial · 10.0mm · 1.09mm/px · z∈[+0,+84]mm · 6 of 24 slices shown]
[im 1/24]
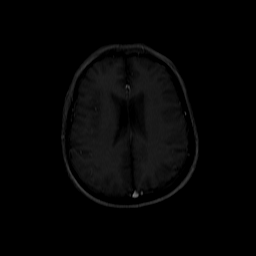
[im 5/24]
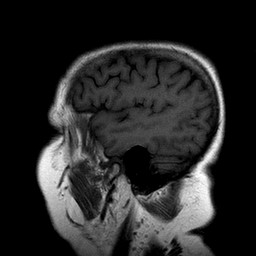
[im 10/24]
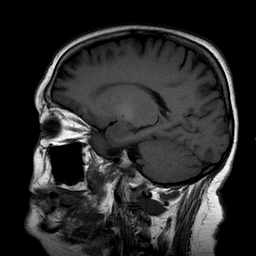
[im 14/24]
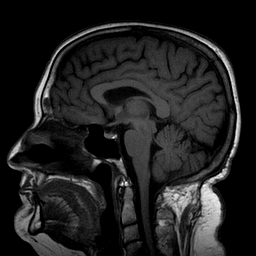
[im 19/24]
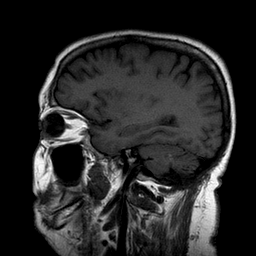
[im 24/24]
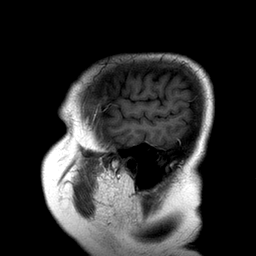

[Series 5: t1_se_axial · axial · 5.0mm · 0.90mm/px · z∈[-50,+88]mm · 5 of 24 slices shown]
[im 1/24]
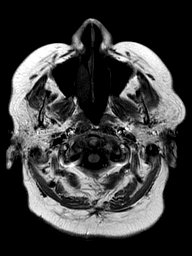
[im 6/24]
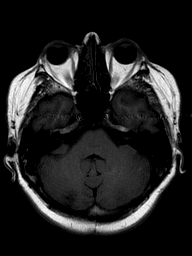
[im 12/24]
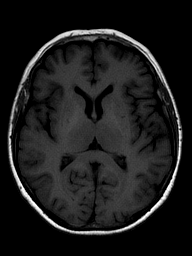
[im 18/24]
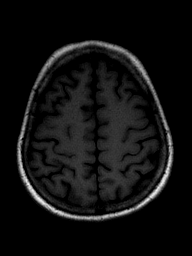
[im 24/24]
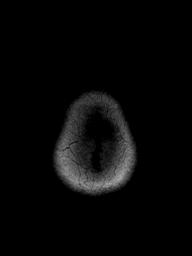

[Series 7: FLAIR · axial · 5.0mm · 0.90mm/px · z∈[-49,+89]mm · 5 of 24 slices shown]
[im 1/24]
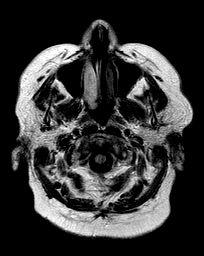
[im 6/24]
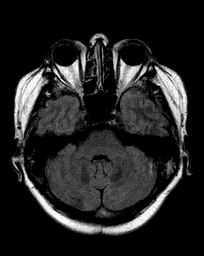
[im 12/24]
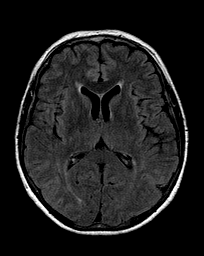
[im 18/24]
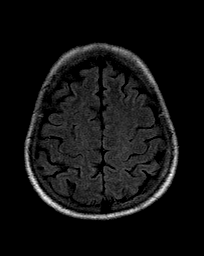
[im 24/24]
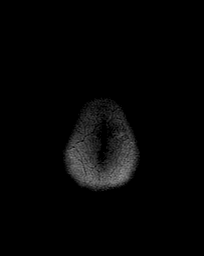

[Series 13: T2 · axial · 5.0mm · 0.90mm/px · z∈[-50,+88]mm · 5 of 24 slices shown]
[im 1/24]
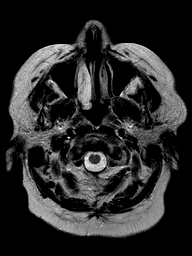
[im 6/24]
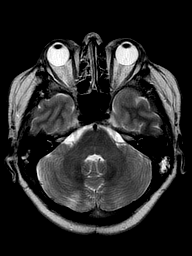
[im 12/24]
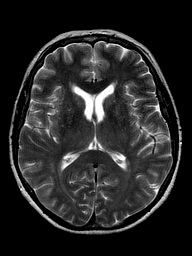
[im 18/24]
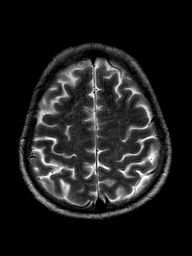
[im 24/24]
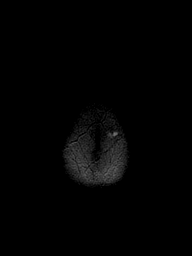

[Series 17: t1_se_cor · coronal · 5.0mm · 0.45mm/px · 6 of 28 slices shown]
[im 1/28]
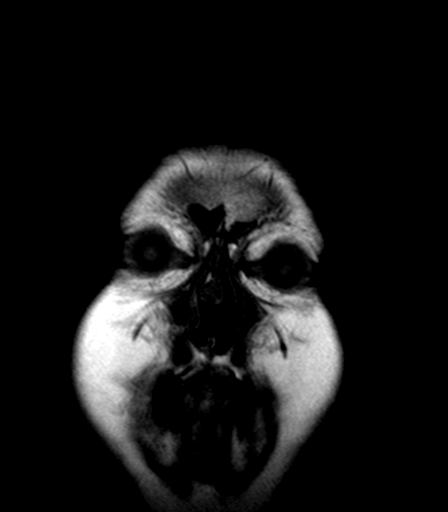
[im 6/28]
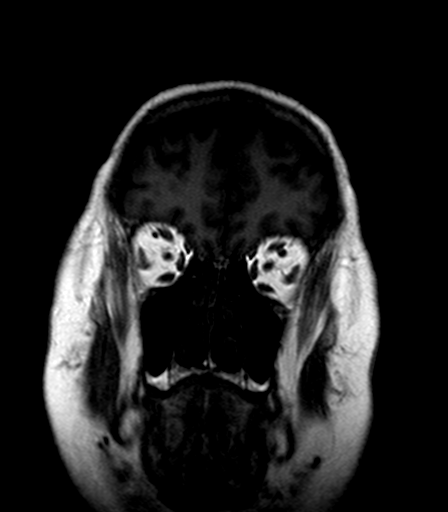
[im 11/28]
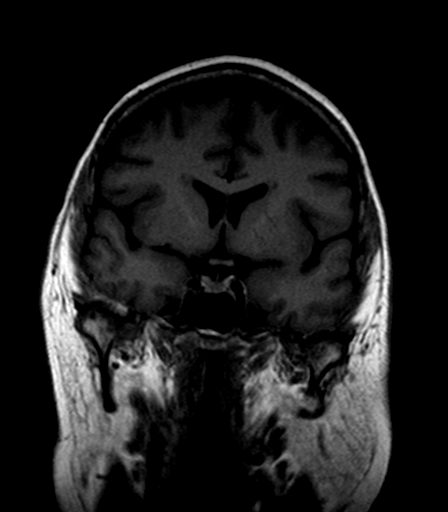
[im 17/28]
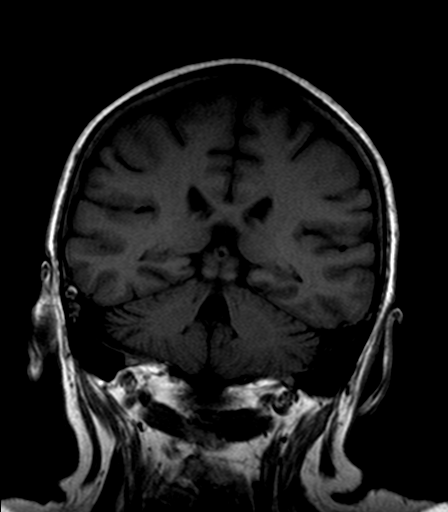
[im 22/28]
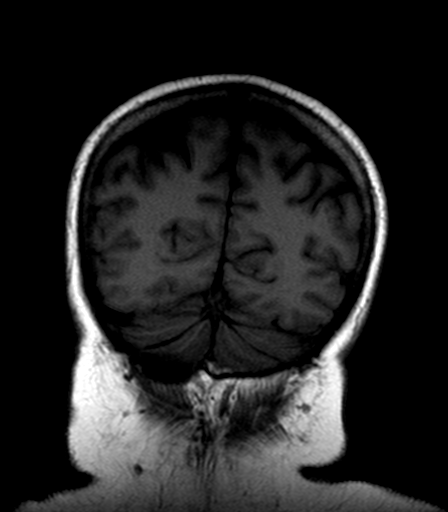
[im 28/28]
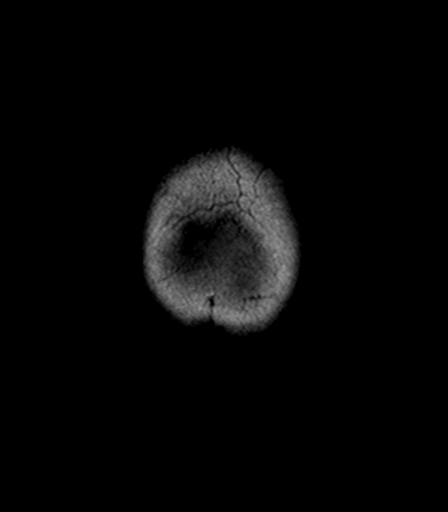

[Series 19: t1_se_cor contrast · coronal · 5.0mm · 0.45mm/px · 6 of 28 slices shown]
[im 1/28]
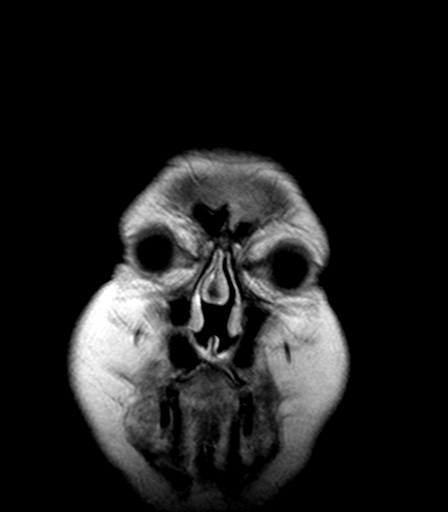
[im 6/28]
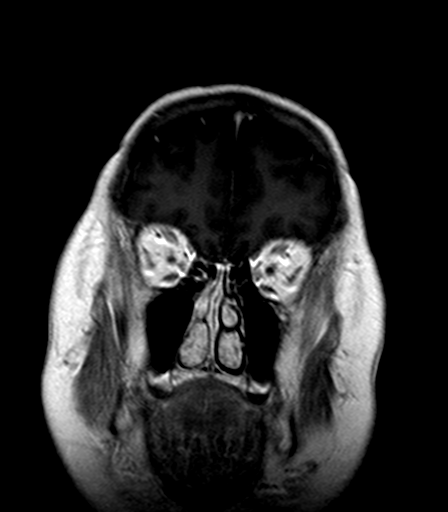
[im 11/28]
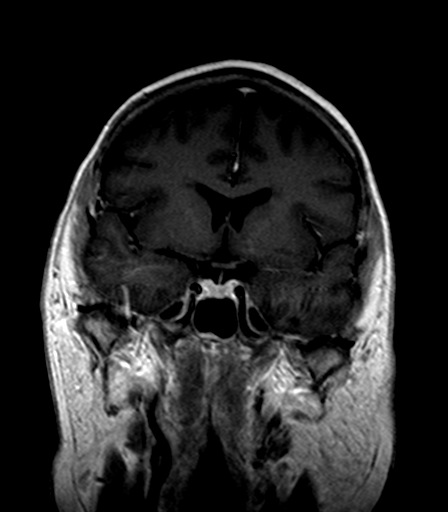
[im 17/28]
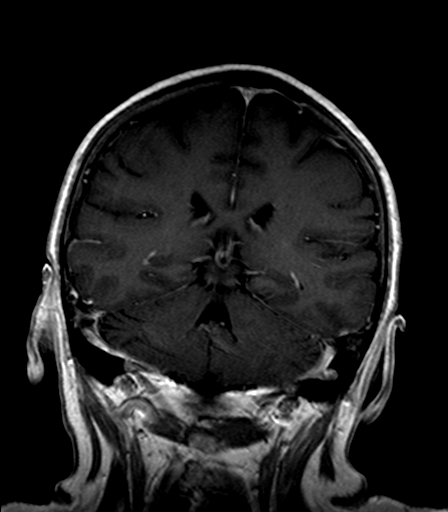
[im 22/28]
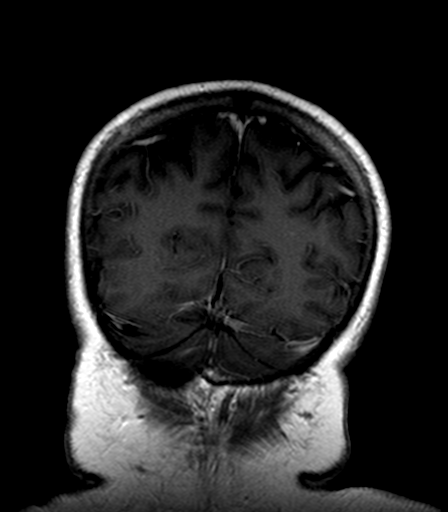
[im 28/28]
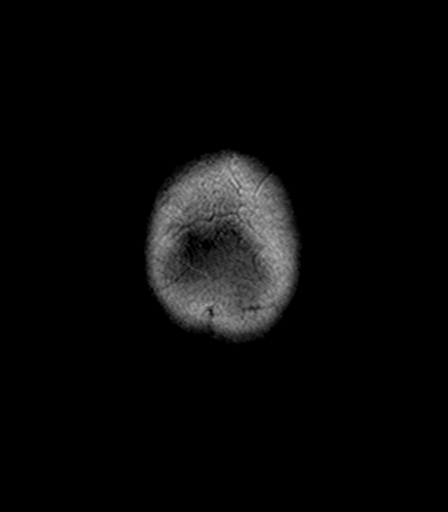

[Series 21: T1 · axial · 5.0mm · 0.90mm/px · z∈[-50,+88]mm · 5 of 24 slices shown]
[im 1/24]
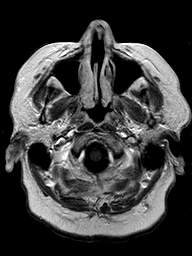
[im 6/24]
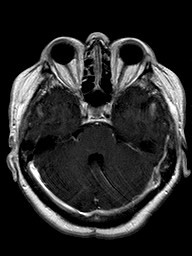
[im 12/24]
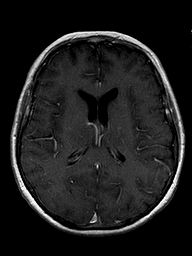
[im 18/24]
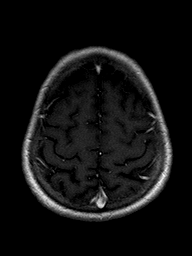
[im 24/24]
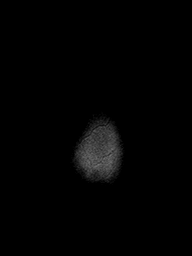

[Series 5002: DWI · axial · 5.0mm · 1.80mm/px · z∈[-39,+140]mm · 5 of 22 slices shown]
[im 1/22]
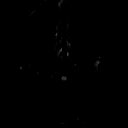
[im 6/22]
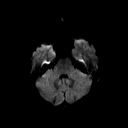
[im 11/22]
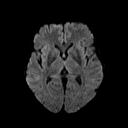
[im 16/22]
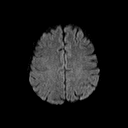
[im 22/22]
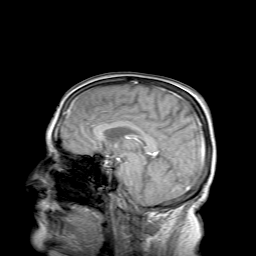

[Series 5004: ADC · axial · 5.0mm · 1.80mm/px · z∈[-39,+140]mm · 5 of 22 slices shown]
[im 1/22]
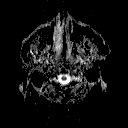
[im 6/22]
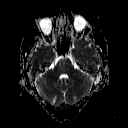
[im 11/22]
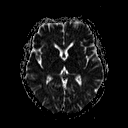
[im 16/22]
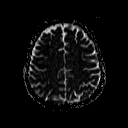
[im 22/22]
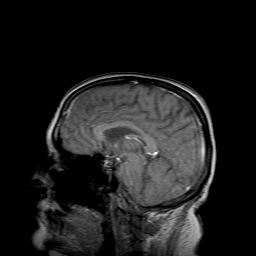

[48 of 48 positions shown; findings below may reference images not displayed]

PROCEDURE:     MR  - MR BRAIN WO/W CONTRAST  - July 11, 2007 [DATE]

RESULT:     Comparison is made to a prior study dated 06/30/06.

Multiplanar/multisequence imaging of the brain was obtained pre and post
intravenous administration of 15 ml of IV Magnevist.

Evaluation with diffusion-weighted imaging appears unchanged.  The area of
minimal enhancement appreciated on the previous study in the post surgical
bed is not clearly identified on the present study. No further regions of
abnormal parenchymal enhancement or enhancing masses are identified.  No
further intraaxial or extraaxial fluid collections are identified.  There
does not appear to be evidence of demyelinating or dysmyelinating disorders.
Areas of mild deep and subcortical, small vessel as well as periventricular
small vessel white matter changes are identified.  Evaluation of the sella
and parasellar regions and structures as well as the cerebellopontine angle
regions demonstrate no signal or enhancement abnormalities. The visualized
portions of the seventh and eighth cranial nerves appear to be intact. The
pons and midbrain demonstrate no signal or enhancement abnormalities.
IMPRESSION: Postsurgical changes are once again identified involving the cerebellum on
the RIGHT.  No new regions of enhancement or new enhancing masses are
identified.  The previously described minimal amount of enhancement within
the post surgical bed appears to have resolved in the interim.  Surveillance
evaluation recommended if and as clinically warranted.

## 2009-06-17 ENCOUNTER — Emergency Department: Payer: Self-pay | Admitting: Emergency Medicine

## 2009-08-10 HISTORY — PX: HAND RECONSTRUCTION: SHX1730

## 2009-10-04 ENCOUNTER — Ambulatory Visit: Payer: Self-pay | Admitting: Specialist

## 2009-10-16 ENCOUNTER — Ambulatory Visit: Payer: Self-pay | Admitting: Specialist

## 2009-11-02 IMAGING — CT CT HEAD WITHOUT CONTRAST
2 series · 16 of 30 positions shown, 20 images · non-contrast
Comparison: none

REASON FOR EXAM: History of brain surgery, frequent falls
COMMENTS:

PROCEDURE:     CT  - CT HEAD WITHOUT CONTRAST  - February 20, 2008  [DATE]
RESULT:     The patient has a history of brain surgery, frequent falls.
TECHNIQUE: Nonenhanced head CT is obtained.
Comparison is made to a prior study of 04/29/2006.

[Series 2: without · axial · non-contrast · 0.39mm/px · z∈[+652,+772]mm · 13 of 30 slices shown, 17 images]
[im 3/30  brain]
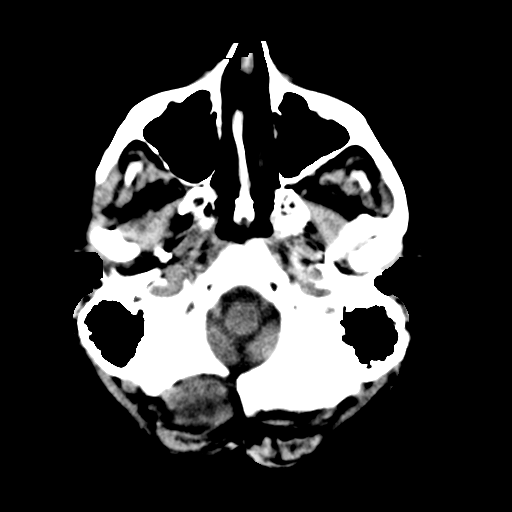
[im 3/30  bone]
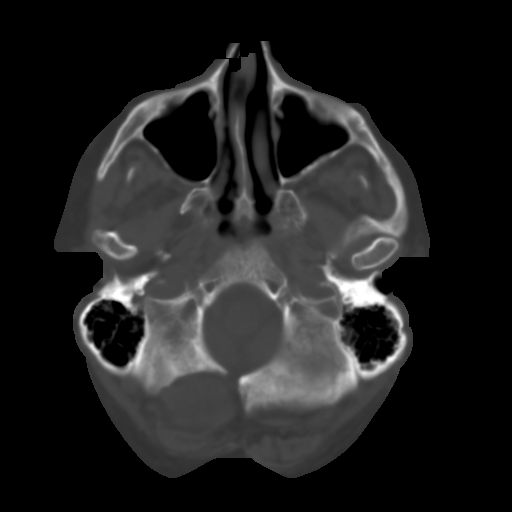
[im 5/30  brain]
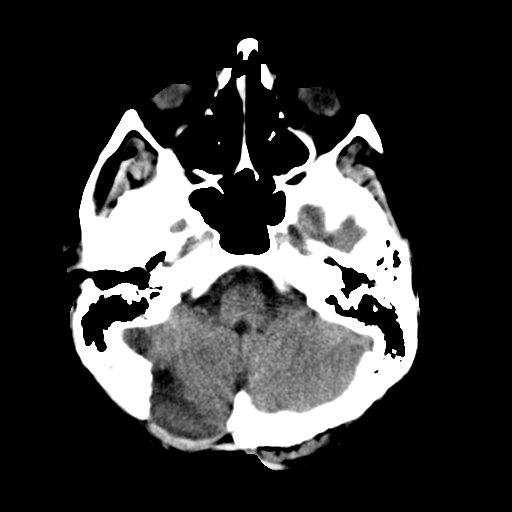
[im 7/30  brain]
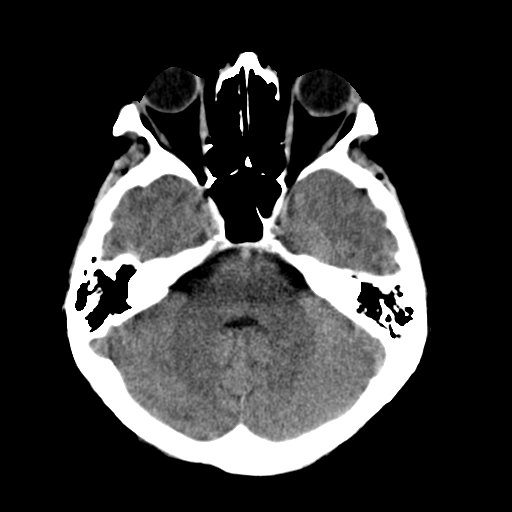
[im 9/30  brain]
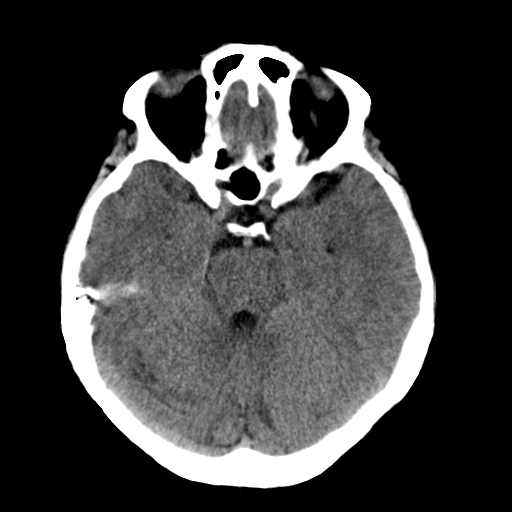
[im 11/30  brain]
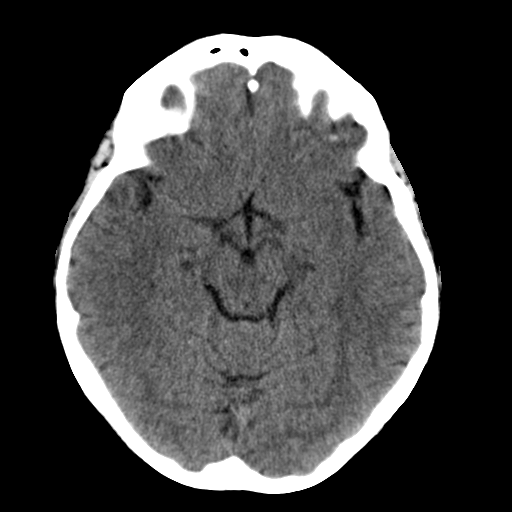
[im 11/30  bone]
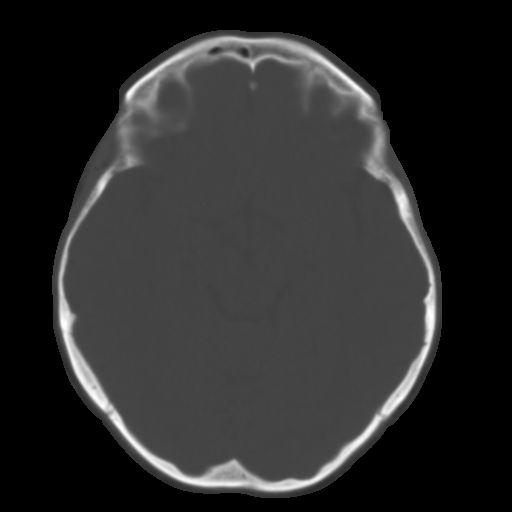
[im 13/30  brain]
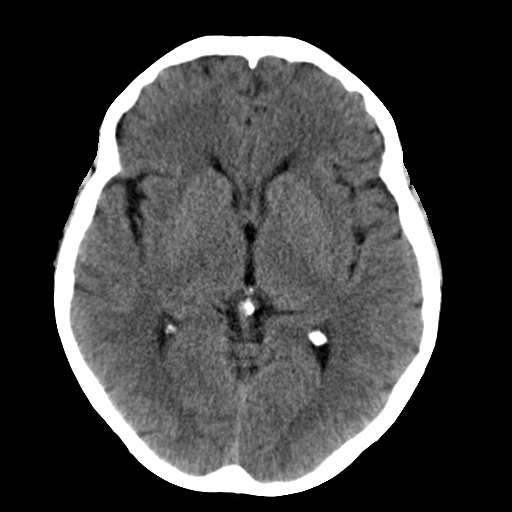
[im 15/30  brain]
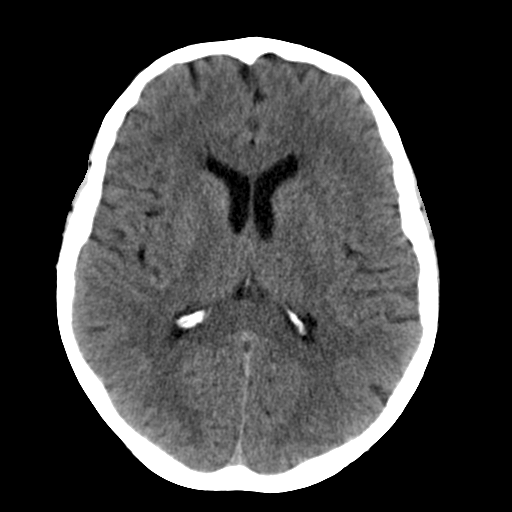
[im 17/30  brain]
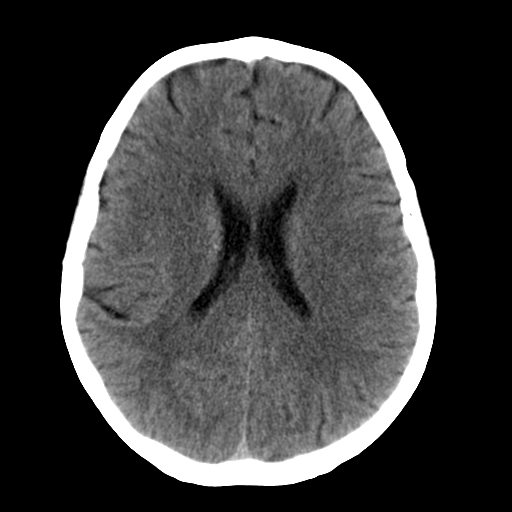
[im 19/30  brain]
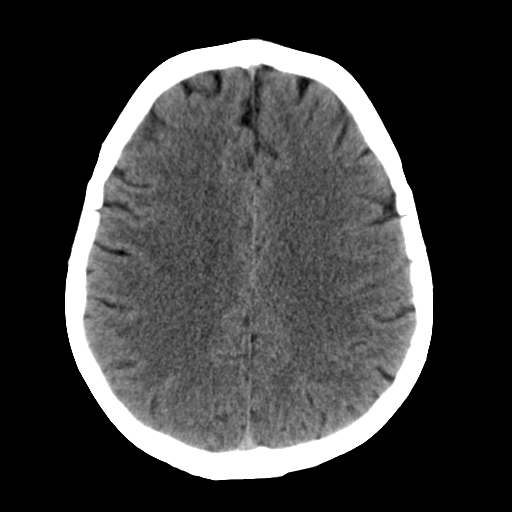
[im 19/30  bone]
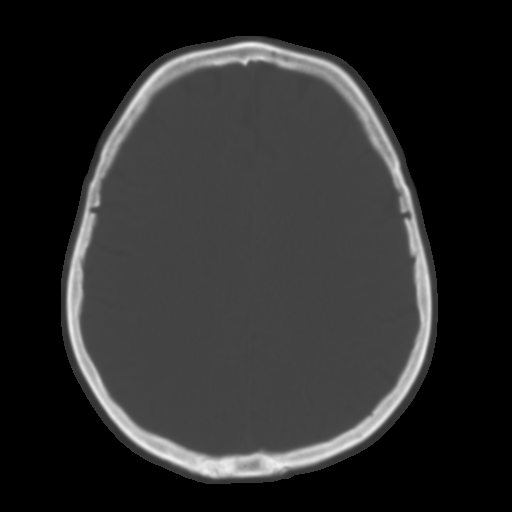
[im 21/30  brain]
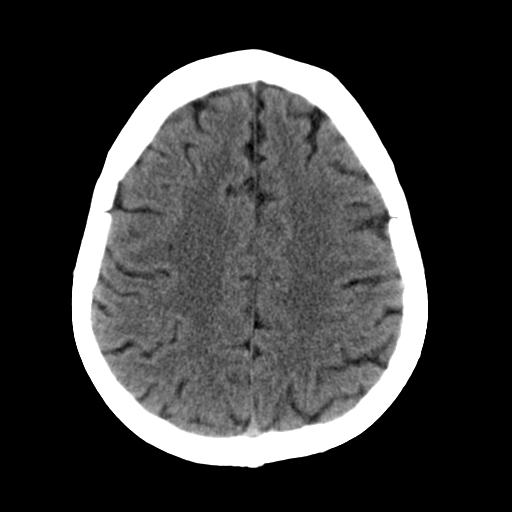
[im 23/30  brain]
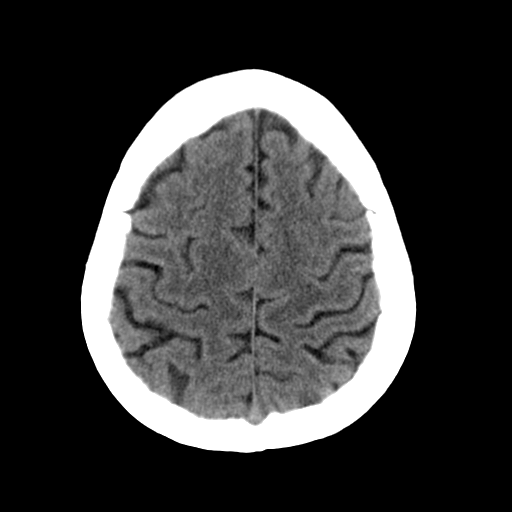
[im 25/30  brain]
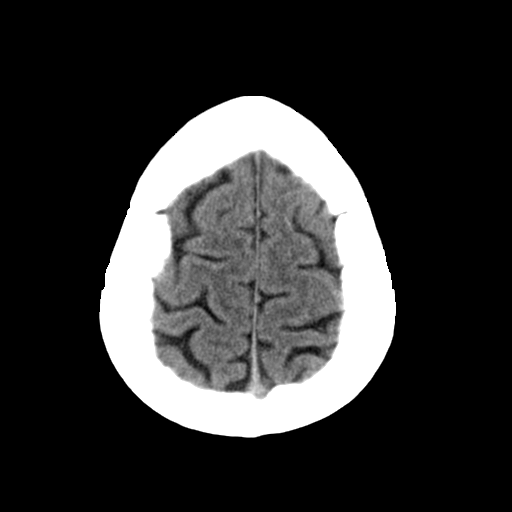
[im 27/30  brain]
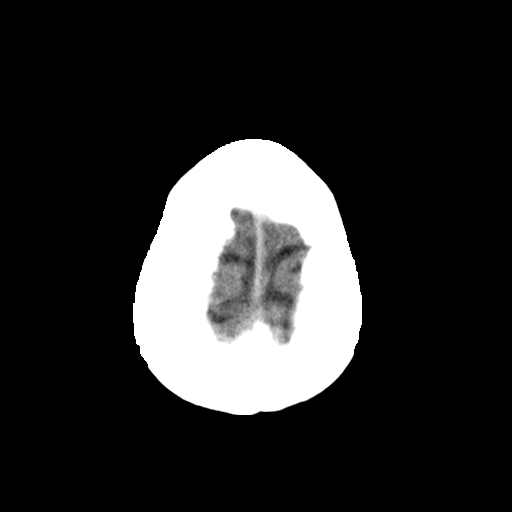
[im 27/30  bone]
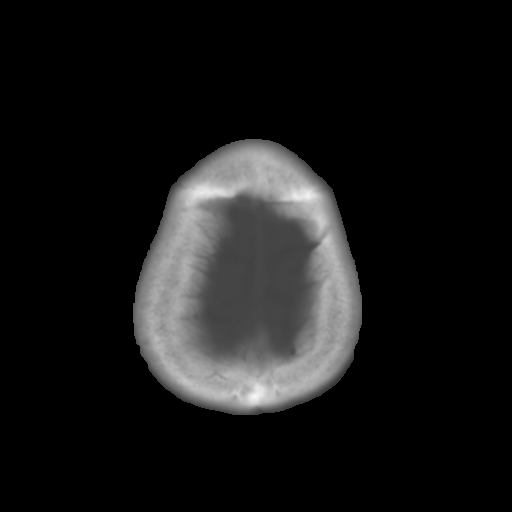

[Series 3: bone · axial · 0.39mm/px · z∈[+652,+692]mm · 3 of 30 slices shown]
[im 3/30  bone]
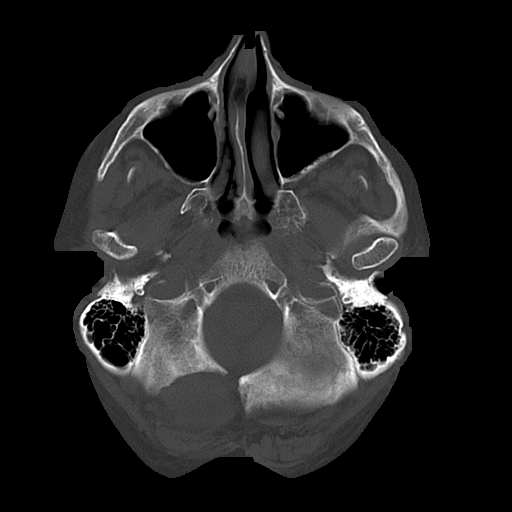
[im 7/30  bone]
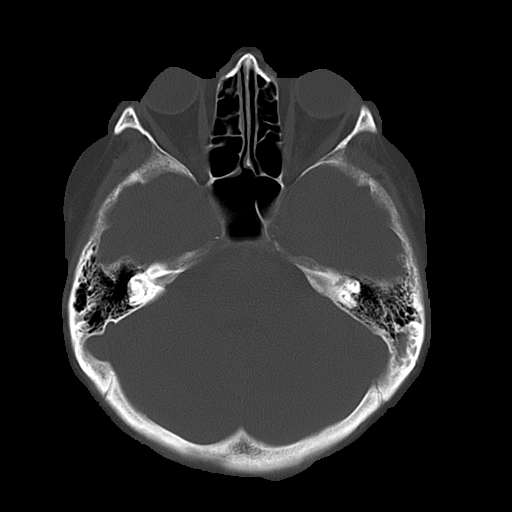
[im 11/30  bone]
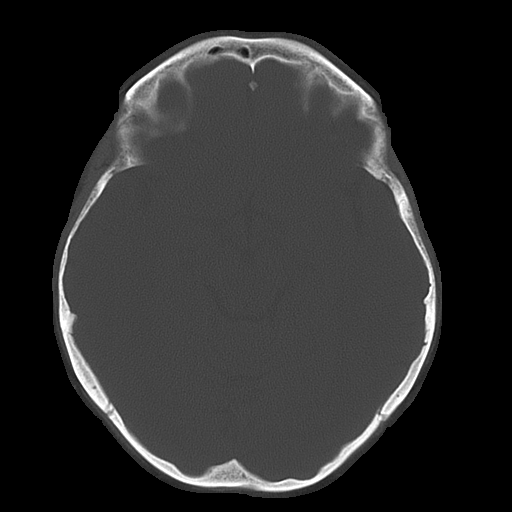

[16 of 30 positions shown; findings below may reference images not displayed]

FINDINGS: No intra-axial or extra-axial pathologic fluid or blood
collections are identified. No mass lesions or hydrocephalus are noted. No
bony abnormalities are identified. Craniotomy defect is noted in the RIGHT
occipital bone.
IMPRESSION: No acute abnormality.  Stable head CT from [DATE].

## 2009-11-02 IMAGING — CR DG SHOULDER 3+V*R*
1 series · 3 of 3 positions shown · non-contrast
Comparison: none

REASON FOR EXAM: fall   PT IN [facility]
COMMENTS:

PROCEDURE:     DXR - DXR SHOULDER RIGHT COMPLETE  - February 20, 2008  [DATE]
RESULT:     Acromioclavicular degenerative change is present.  There is no
evidence of fracture or dislocation.

[Series 1: view not recorded · 0.17mm/px · 3 of 3 slices shown]
[im 1/3]
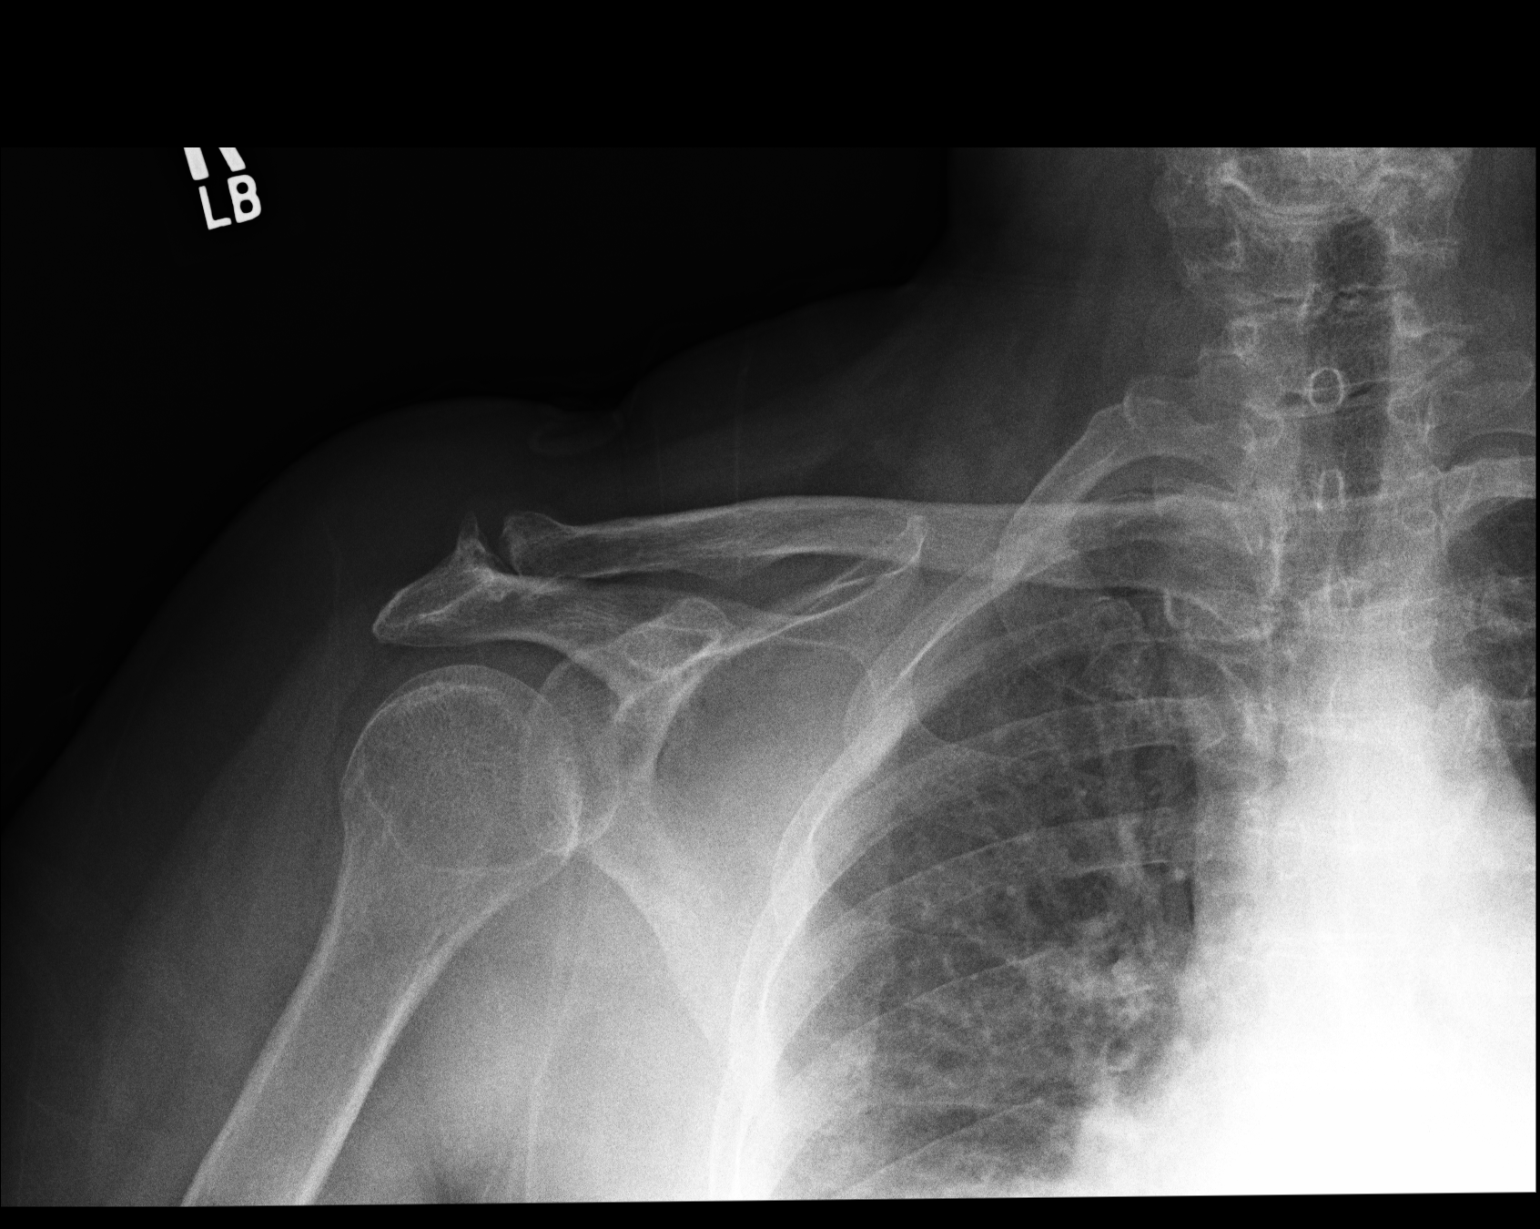
[im 2/3]
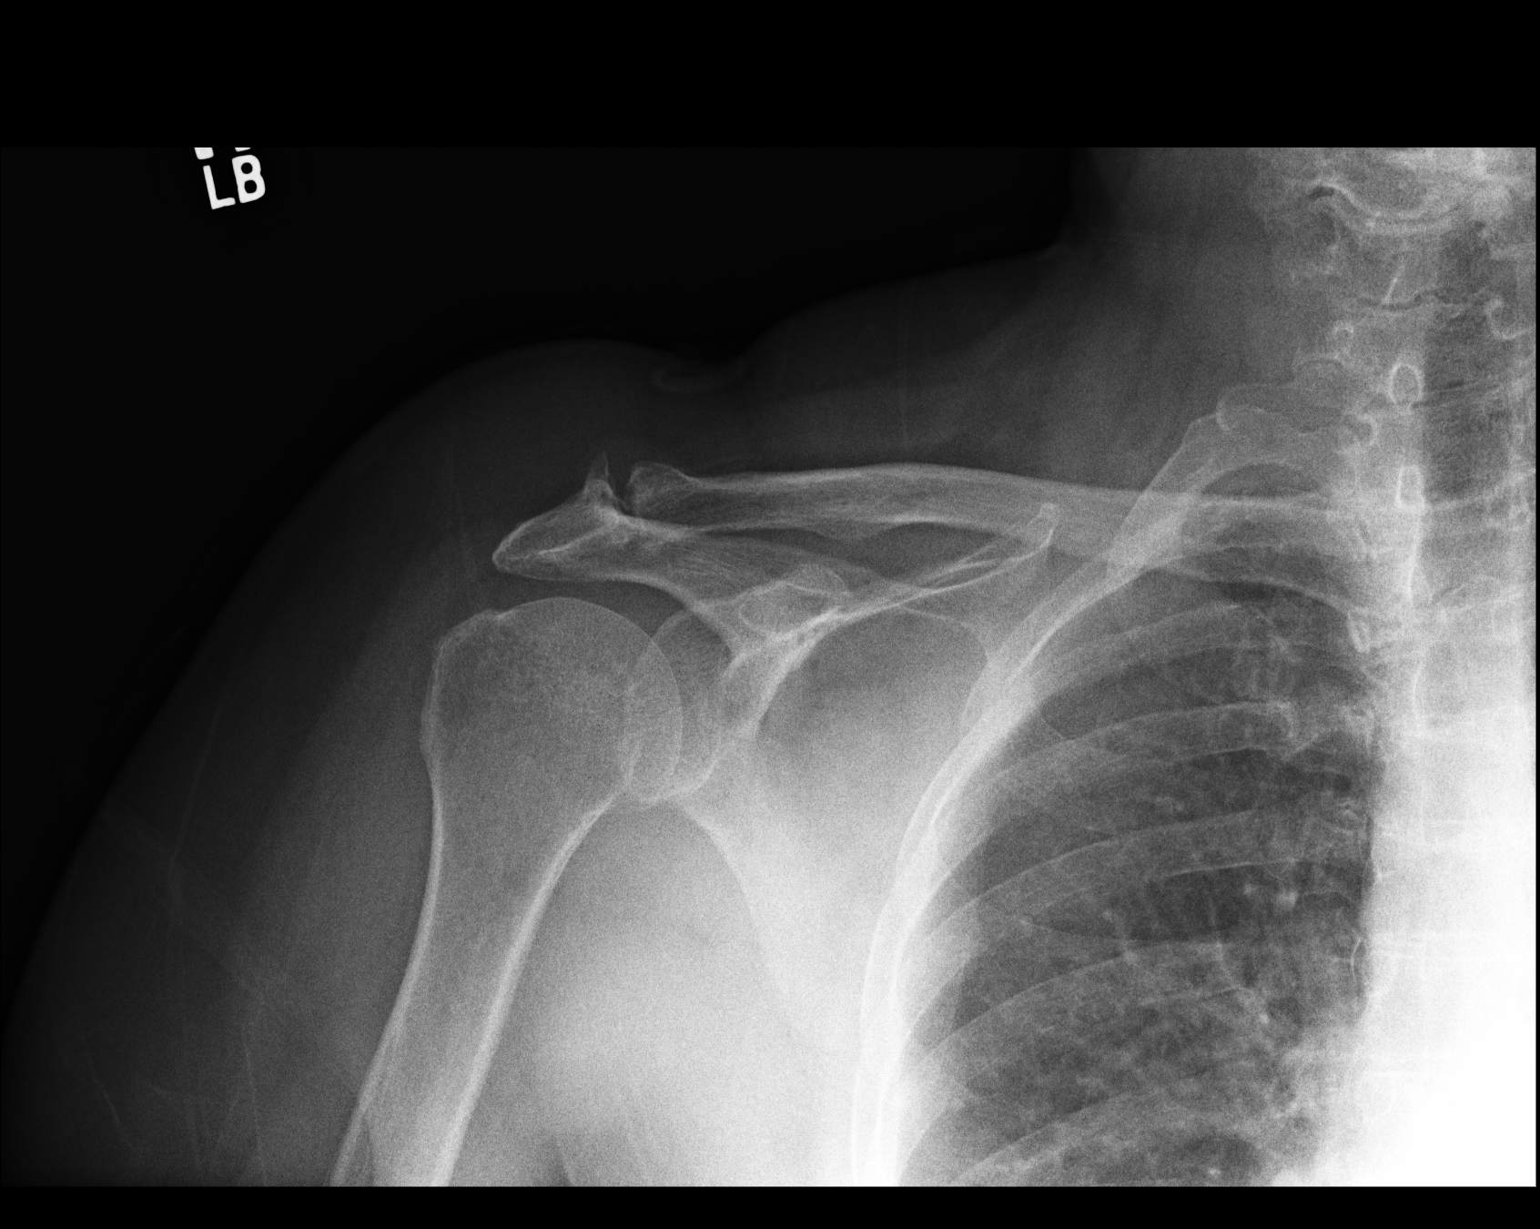
[im 3/3]
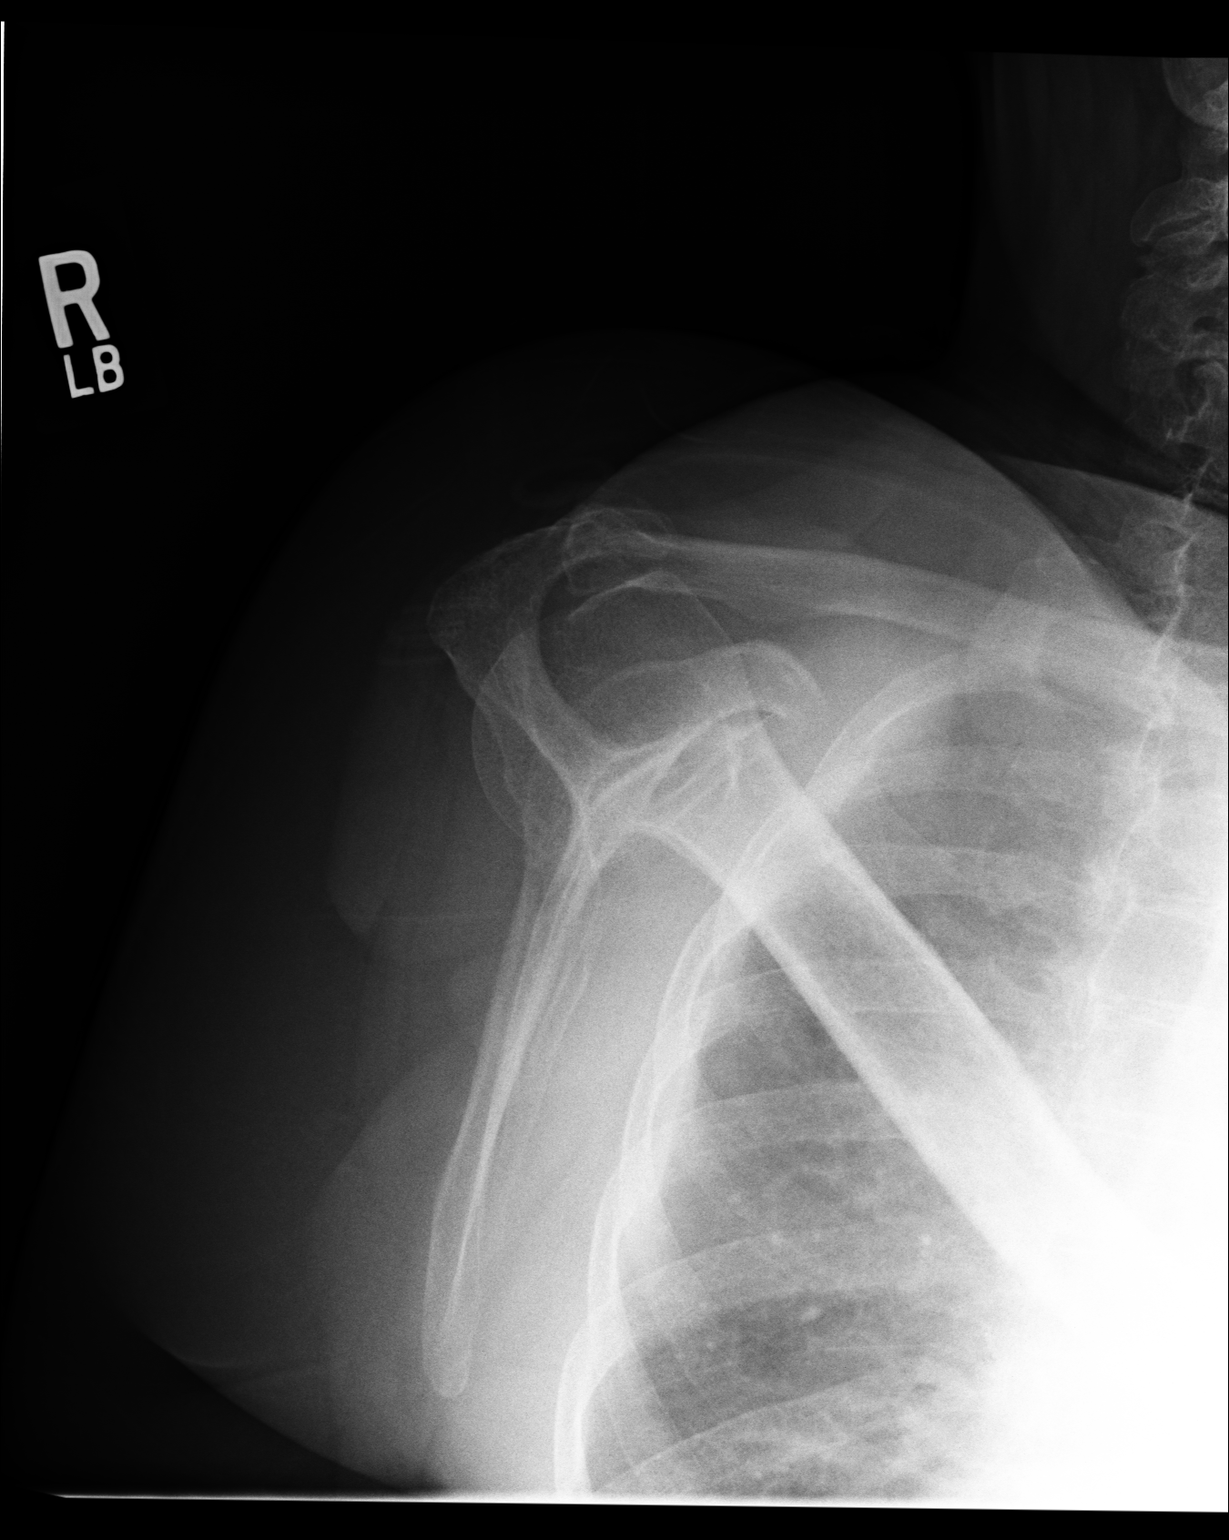

[3 of 3 positions shown; findings below may reference images not displayed]

IMPRESSION: 1.     Acromioclavicular degenerative change.
2.     No evidence of fracture.

## 2009-11-02 IMAGING — CR DG HUMERUS 2V *R*
1 series · 2 of 2 positions shown · non-contrast
Comparison: none

REASON FOR EXAM: s/p fall with pain/swelling
COMMENTS:

PROCEDURE:     DXR - DXR HUMERUS RIGHT  - February 20, 2008  [DATE]
RESULT:     No fracture, dislocation or other acute bony abnormality is
identified.  There is incidentally noted spur formation at the
acromioclavicular joint.

[Series 1: view not recorded · 0.17mm/px · 2 of 2 slices shown]
[im 1/2]
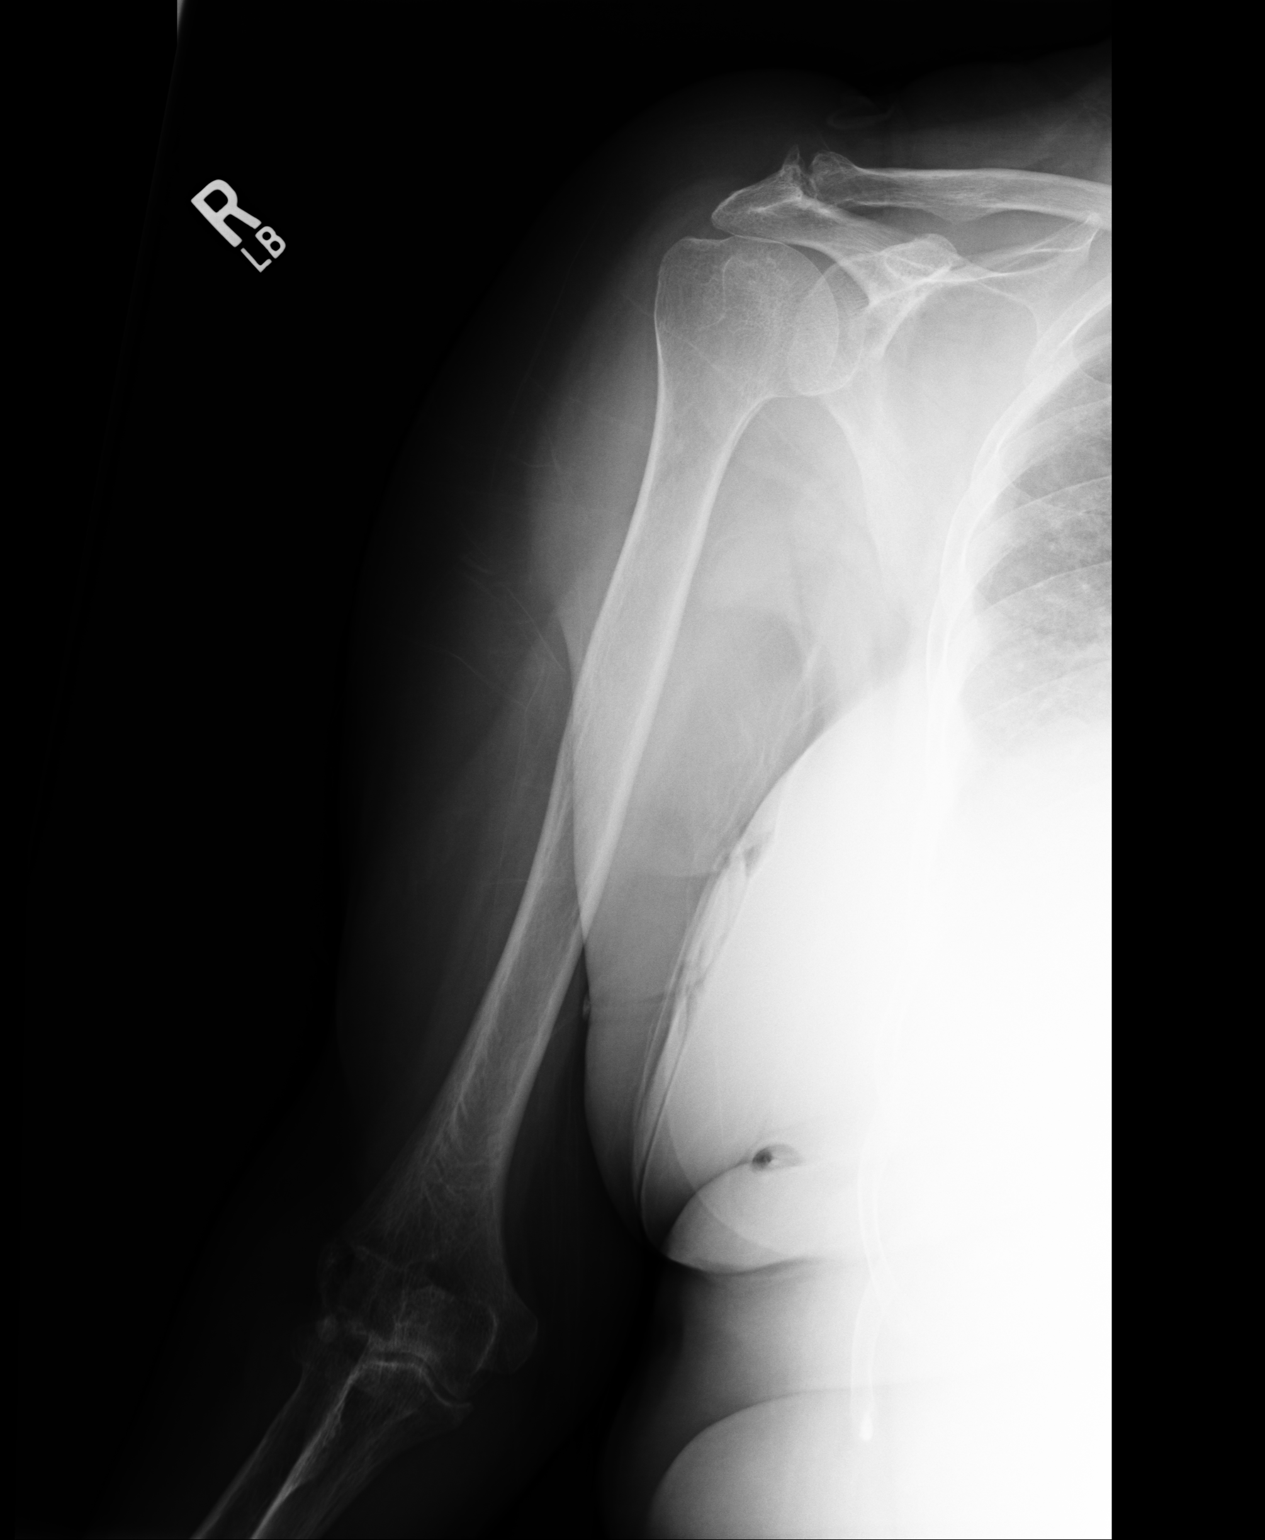
[im 2/2]
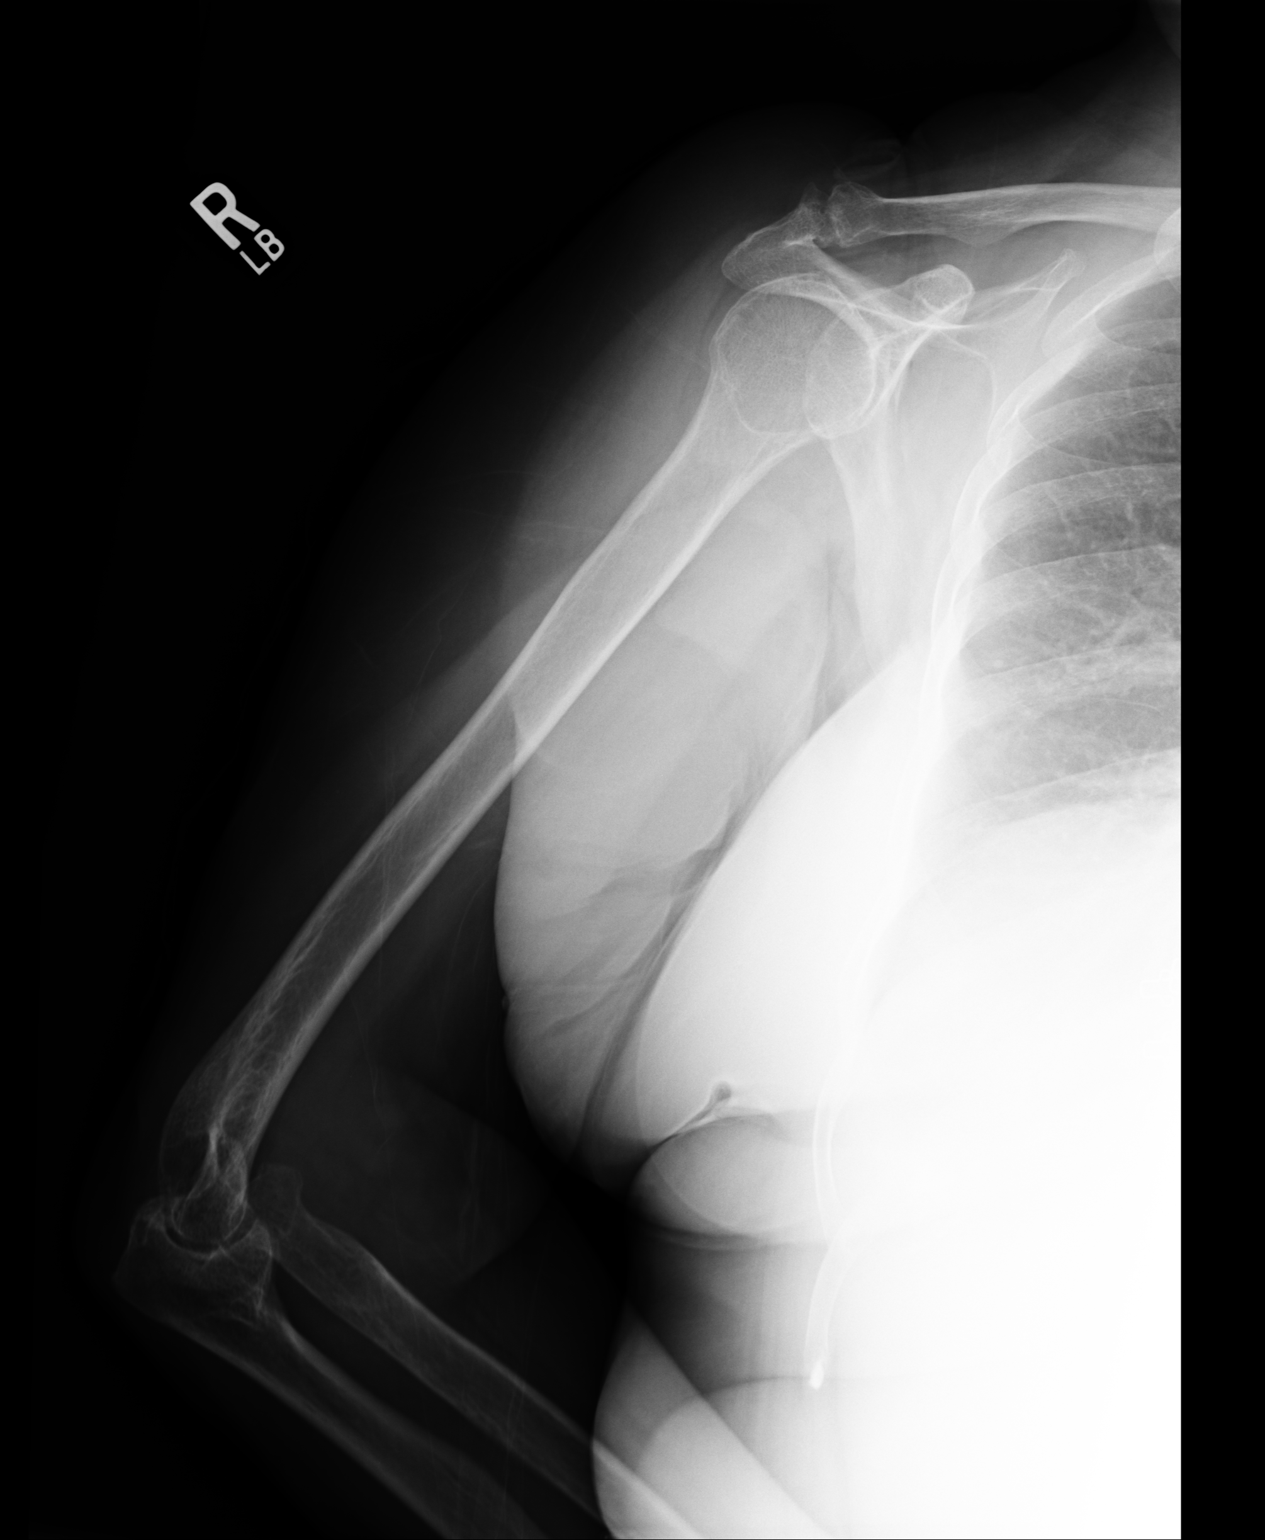

[2 of 2 positions shown; findings below may reference images not displayed]

IMPRESSION: 1. No acute bony abnormalities are identified.
2. There is spur formation at the RIGHT acromioclavicular joint.

## 2009-11-02 IMAGING — CR DG WRIST 2V*R*
1 series · 2 of 2 positions shown · non-contrast
Comparison: none

REASON FOR EXAM: fall PT IN [facility]
COMMENTS:

PROCEDURE:     DXR - DXR WRIST RIGHT AP AND LATERAL  - February 20, 2008  [DATE]
RESULT:     Degenerative change is noted about the RIGHT wrist. There is no
evidence of fracture.  Carpometacarpal degenerative change is also noted.

[Series 1: view not recorded · 0.17mm/px · 2 of 2 slices shown]
[im 1/2]
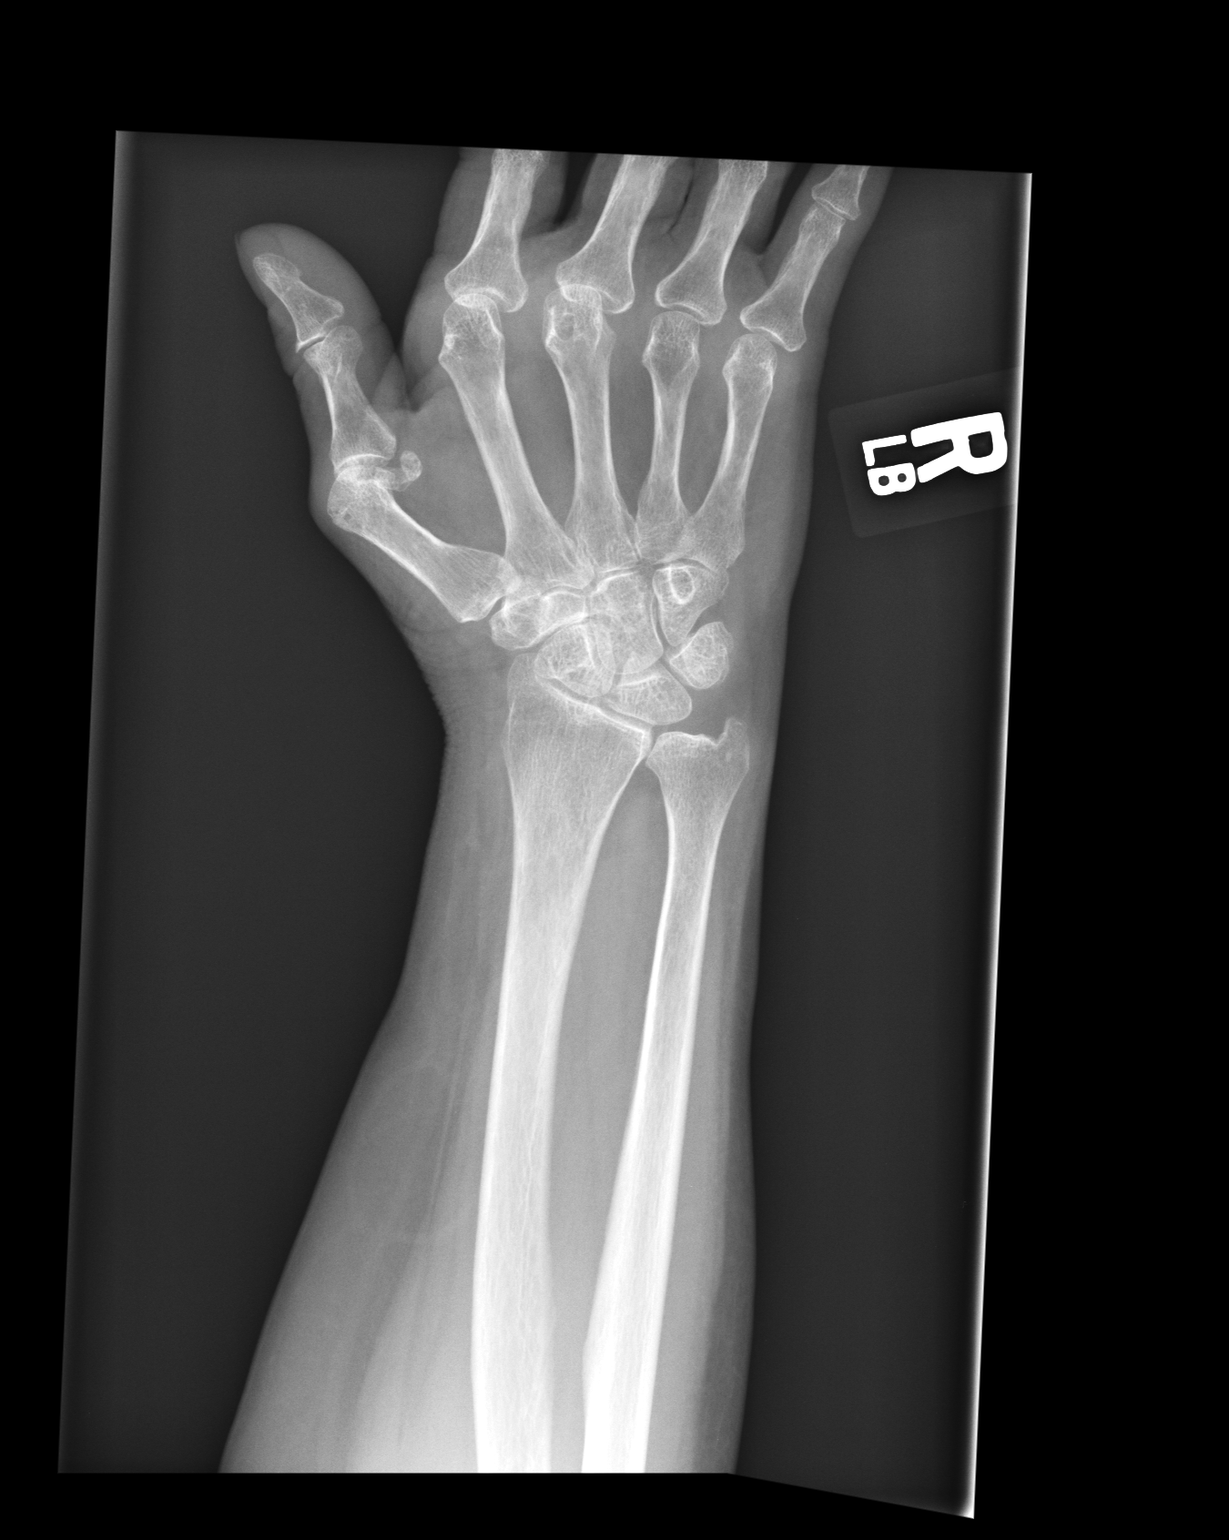
[im 2/2]
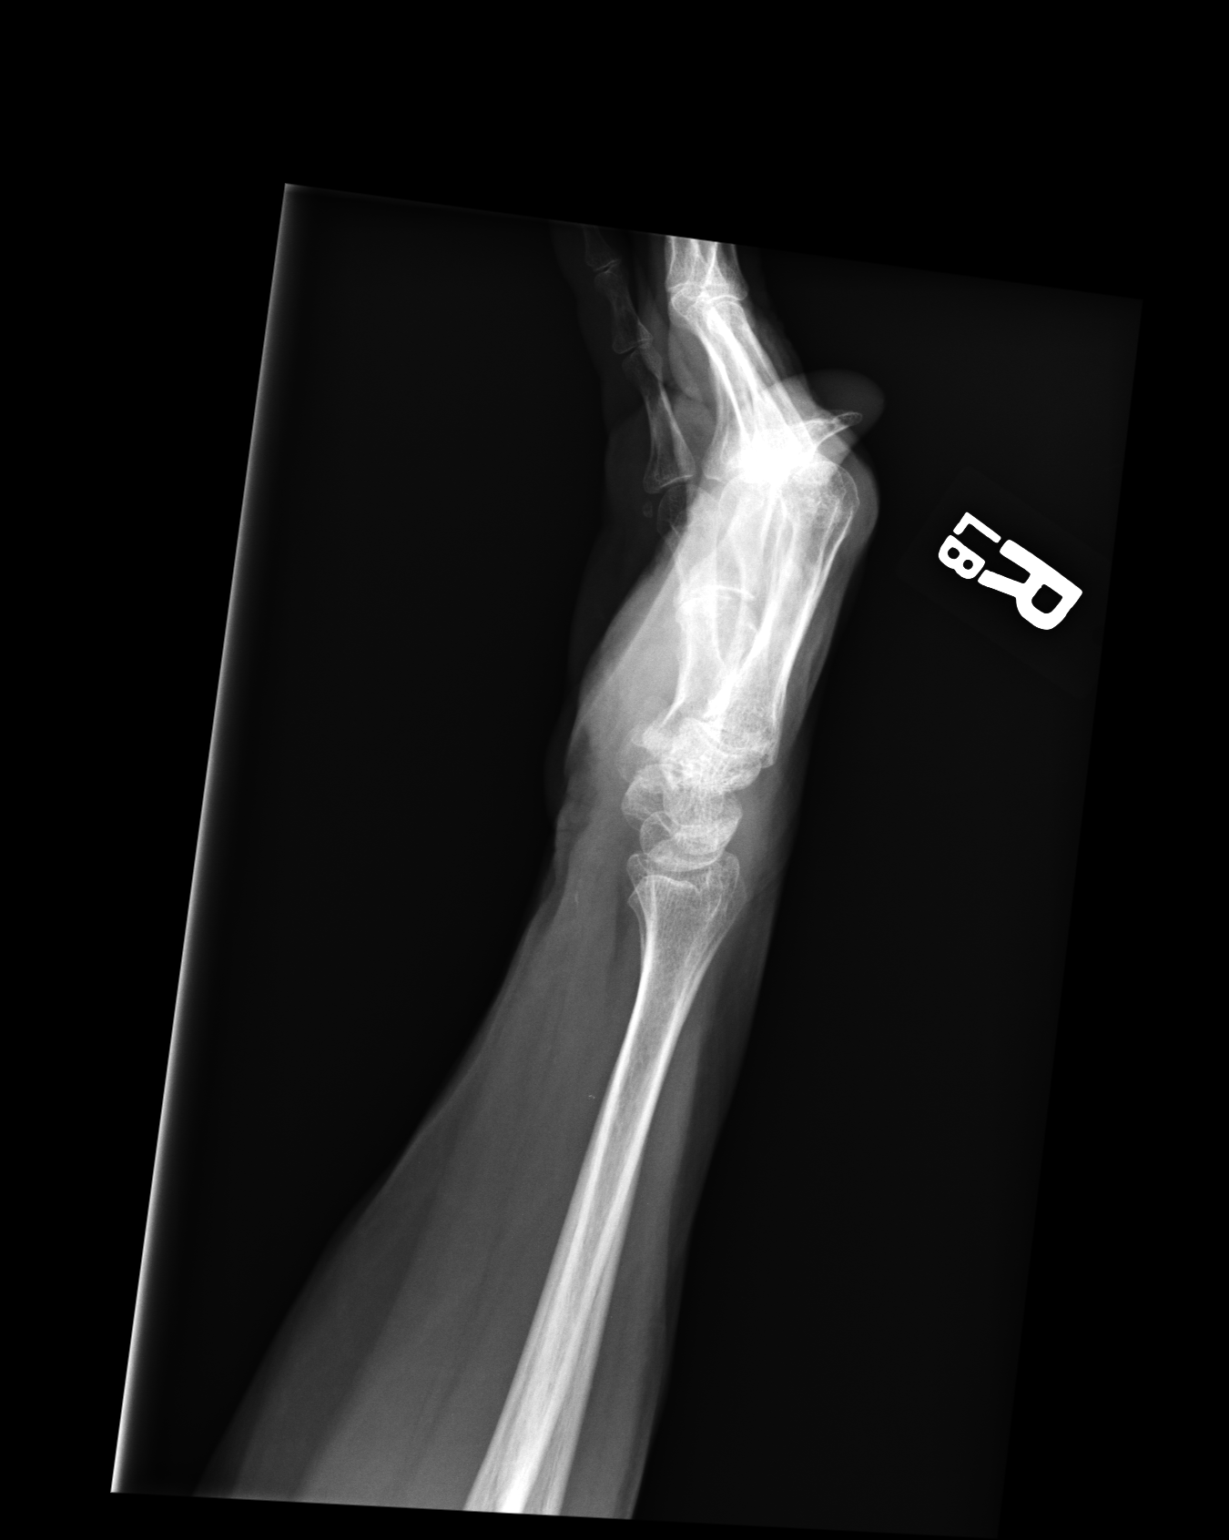

[2 of 2 positions shown; findings below may reference images not displayed]

IMPRESSION: No acute or focal abnormality identified. Diffuse degenerative change is
noted about the wrist and hand.

## 2009-11-02 IMAGING — CR RIGHT ELBOW - 2 VIEW
1 series · 2 of 2 positions shown · non-contrast
Comparison: none

REASON FOR EXAM: fall PT IN [facility]
COMMENTS:

[Series 1: view not recorded · 0.17mm/px · 2 of 2 slices shown]
[im 1/2]
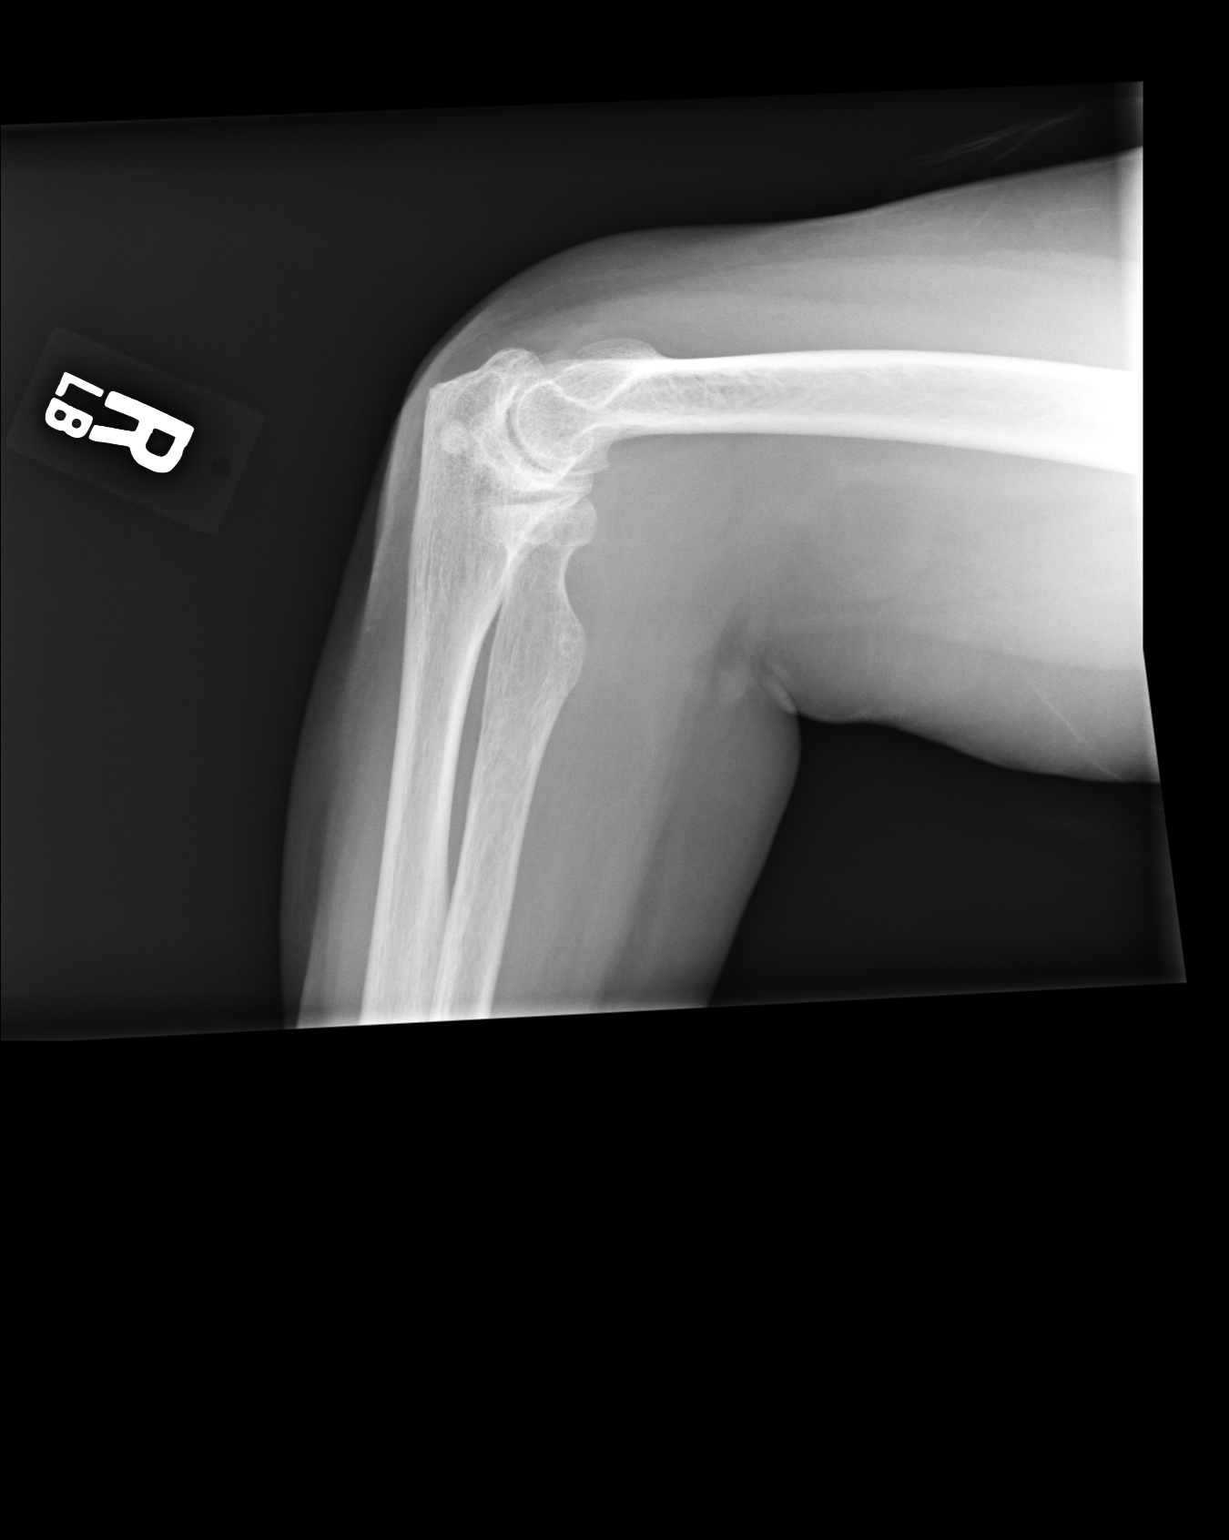
[im 2/2]
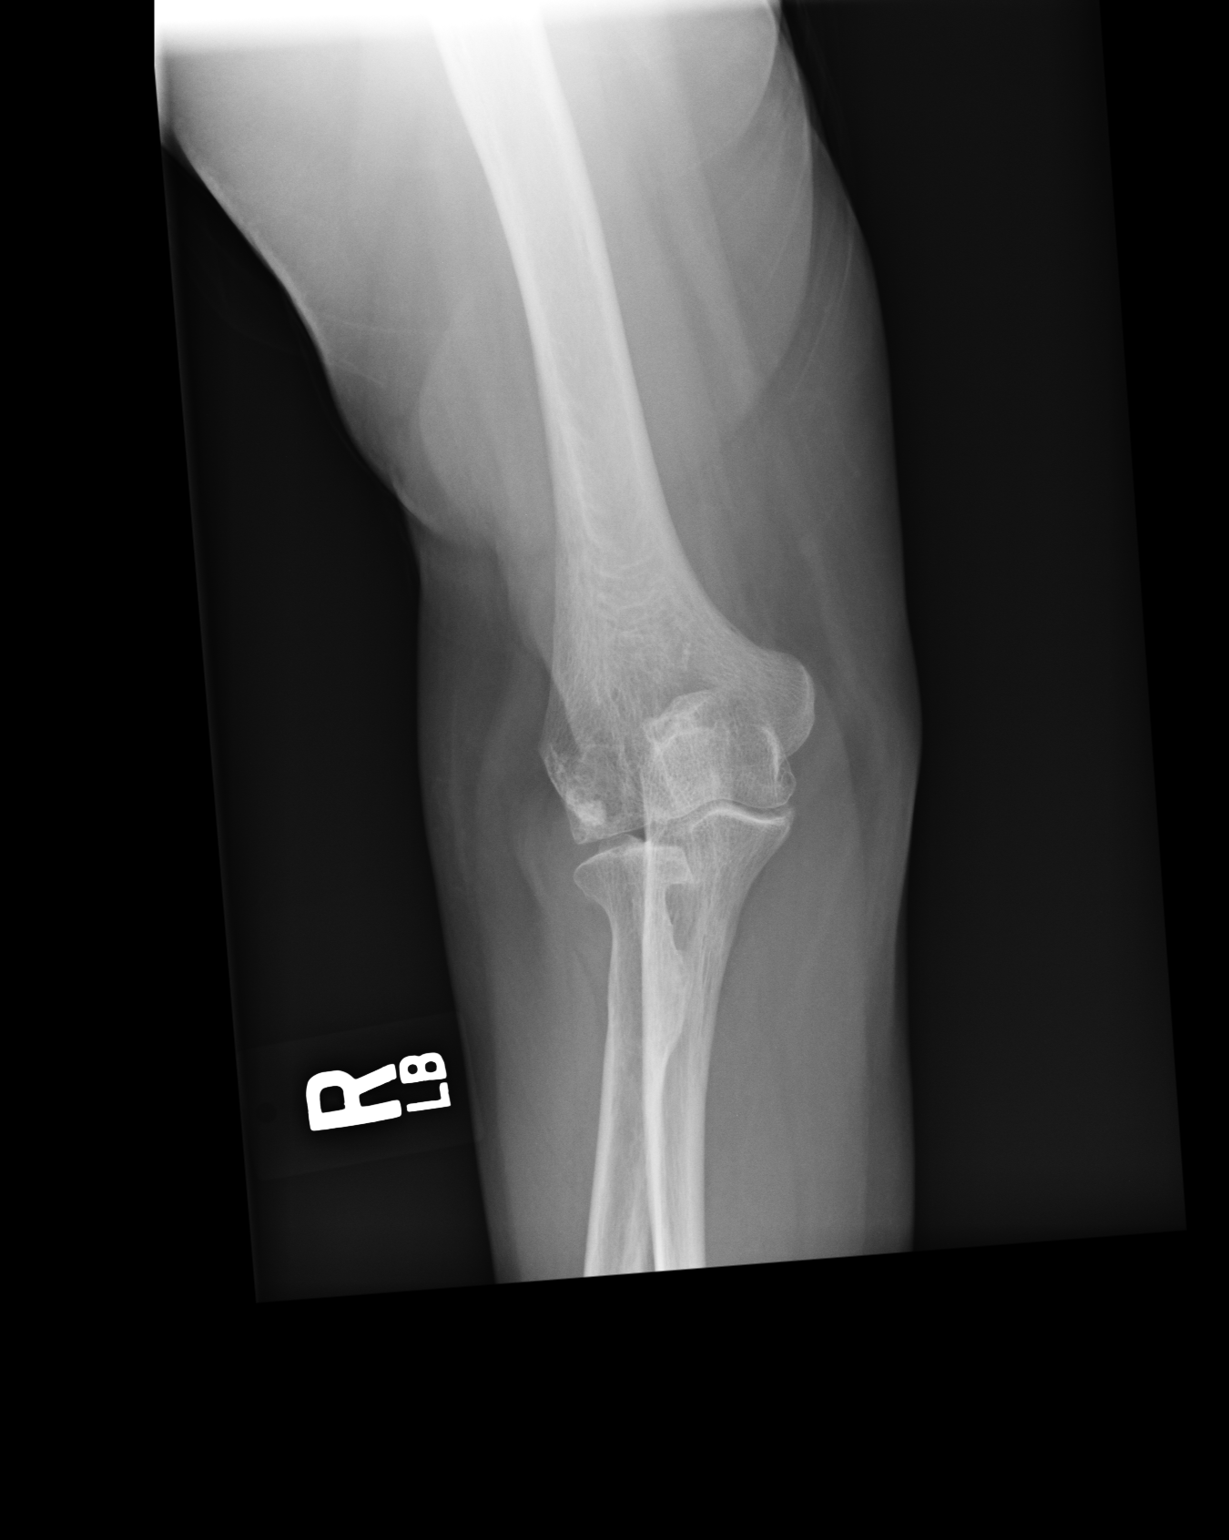

[2 of 2 positions shown; findings below may reference images not displayed]

PROCEDURE:     DXR - DXR ELBOW RT AP AND LATERAL  - February 20, 2008  [DATE]

RESULT:     No evidence of acute fracture or dislocation.  No evidence of
significant effusion.  Sclerotic density is noted over the distal humerus on
AP view and posterior to the humerus on lateral view. This may represent a
loose body.  A donor site is not identified.
IMPRESSION: Degenerative change with probable loose body.  No definite fracture is
identified.

## 2009-11-13 IMAGING — MR MRI HEAD WITHOUT AND WITH CONTRAST
6 of 9 series · 30 of 48 positions shown · non-contrast
Comparison: none

REASON FOR EXAM: headaches, dizziness, loss of balance, falls, brain
surgery 0112  CT [DATE]
COMMENTS:

[Series 2: t1_se_sag · axial · 10.0mm · 0.55mm/px · z∈[+0,+85]mm · 5 of 24 slices shown]
[im 1/24]
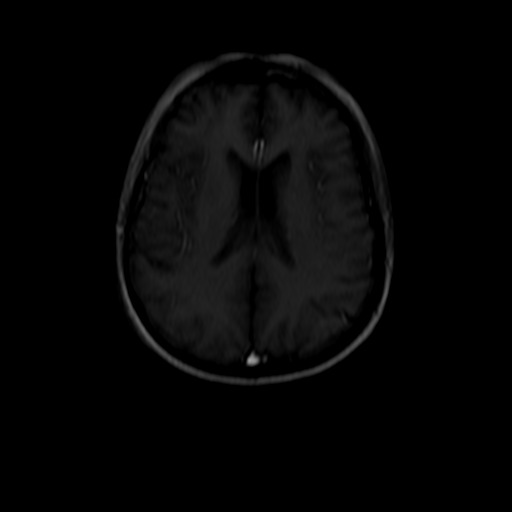
[im 5/24]
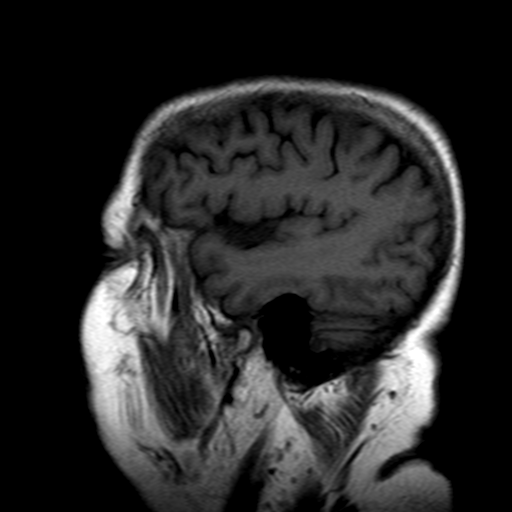
[im 10/24]
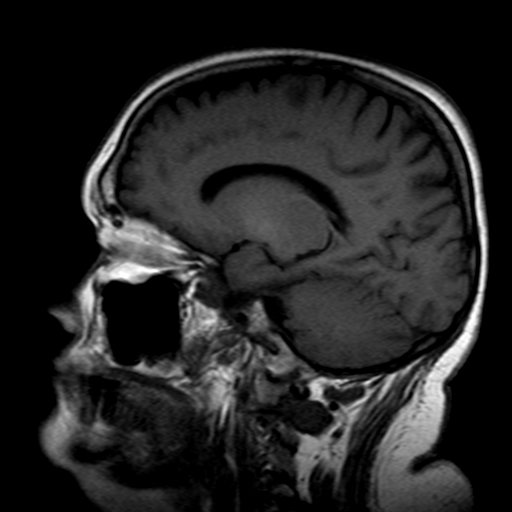
[im 14/24]
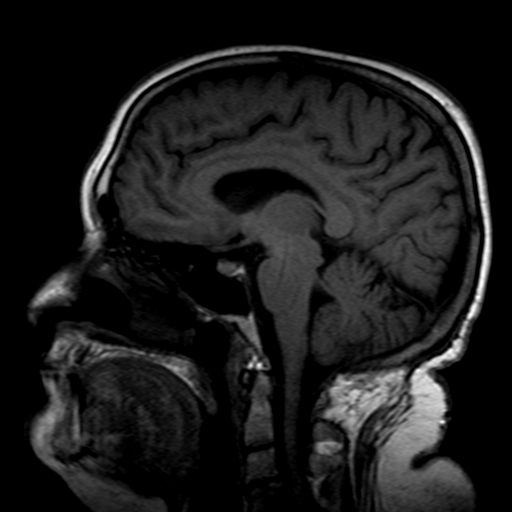
[im 19/24]
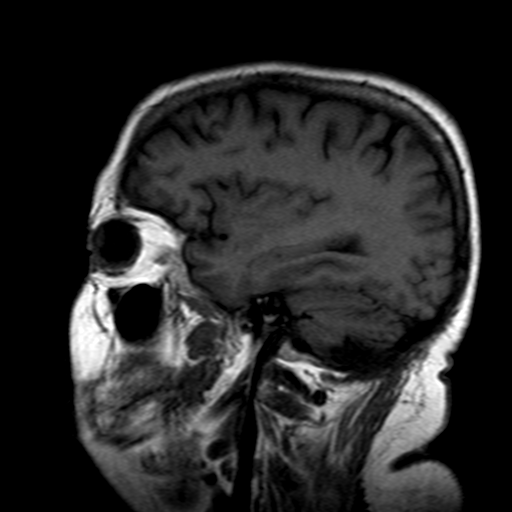

[Series 12: T2 · axial · 5.0mm · 0.45mm/px · z∈[-55,+85]mm · 5 of 24 slices shown]
[im 1/24]
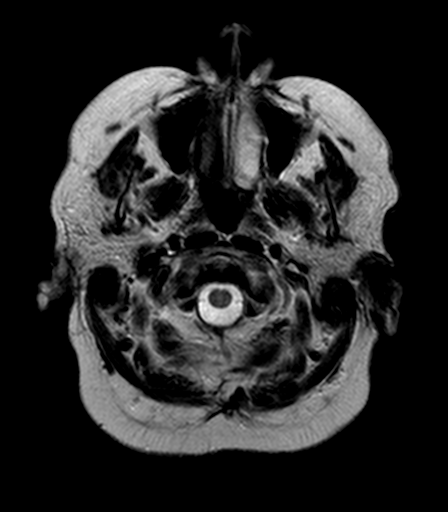
[im 6/24]
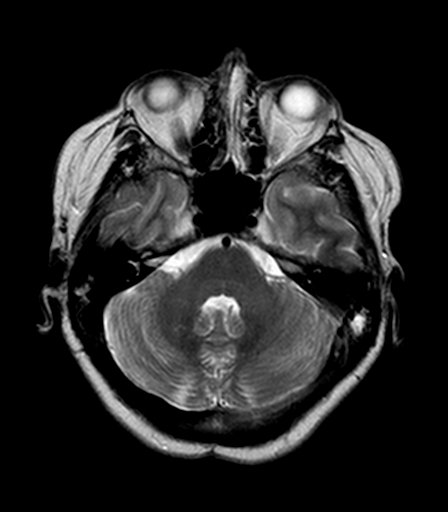
[im 12/24]
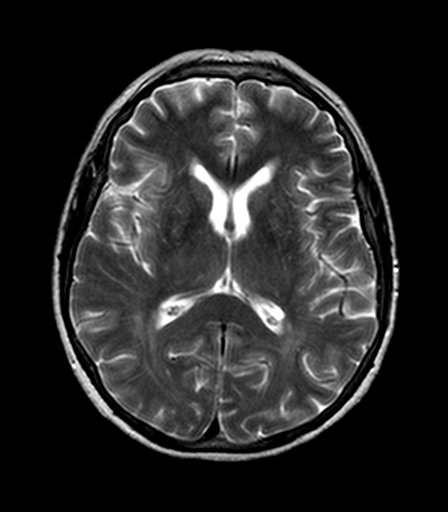
[im 18/24]
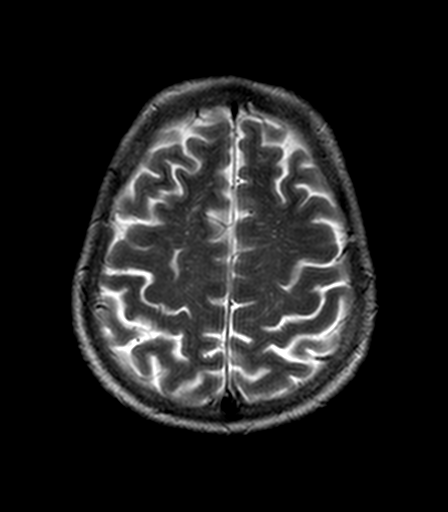
[im 24/24]
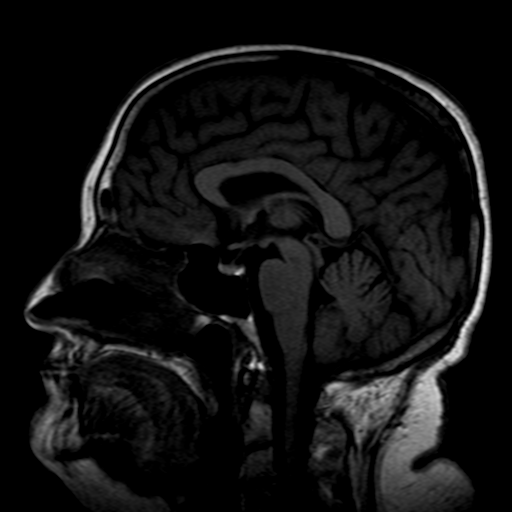

[Series 14: FLAIR · axial · 5.0mm · 0.90mm/px · z∈[-55,+85]mm · 5 of 24 slices shown]
[im 1/24]
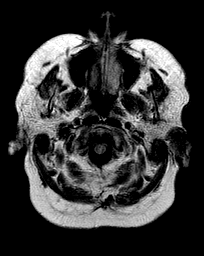
[im 6/24]
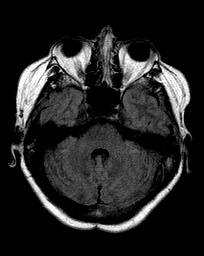
[im 12/24]
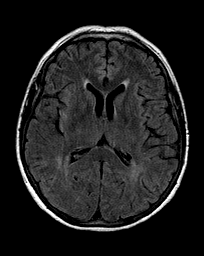
[im 18/24]
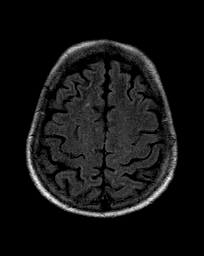
[im 24/24]
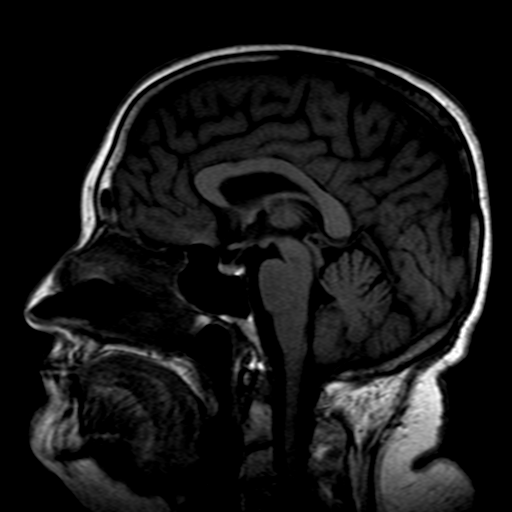

[Series 19: T1 · axial · 5.0mm · 0.45mm/px · z∈[-56,+83]mm · 5 of 23 slices shown]
[im 1/23]
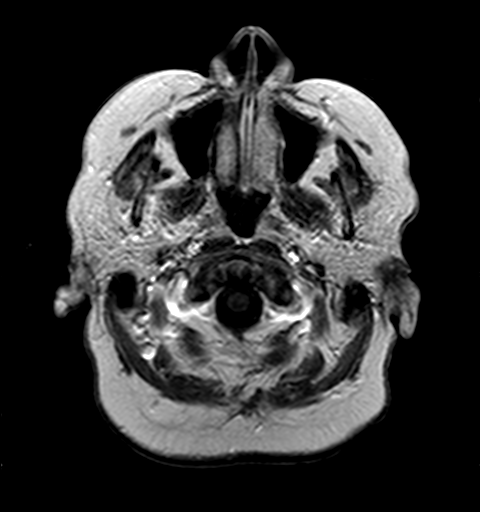
[im 6/23]
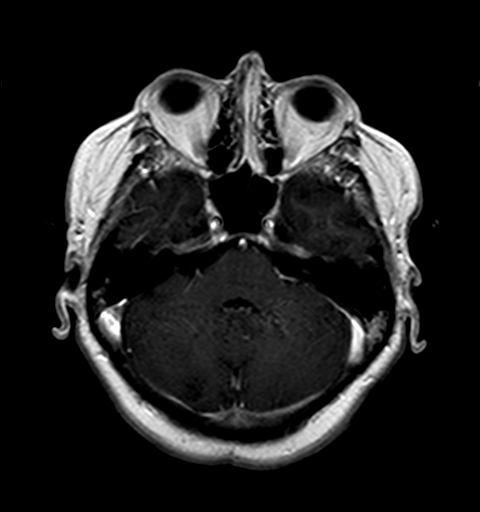
[im 12/23]
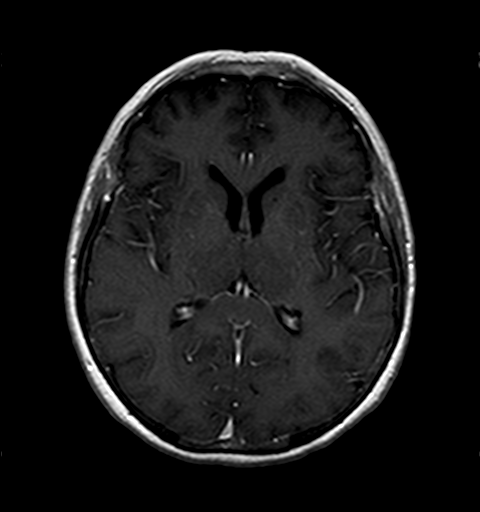
[im 17/23]
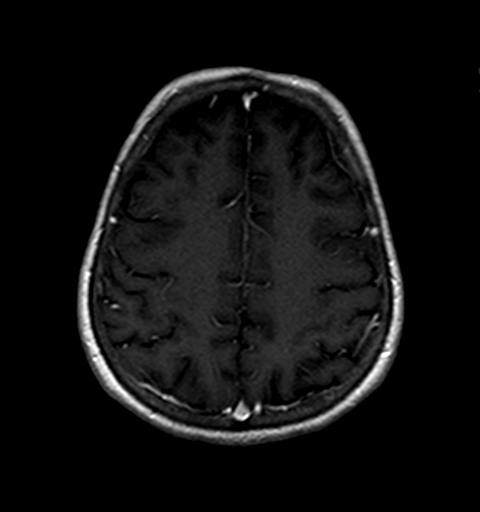
[im 23/23]
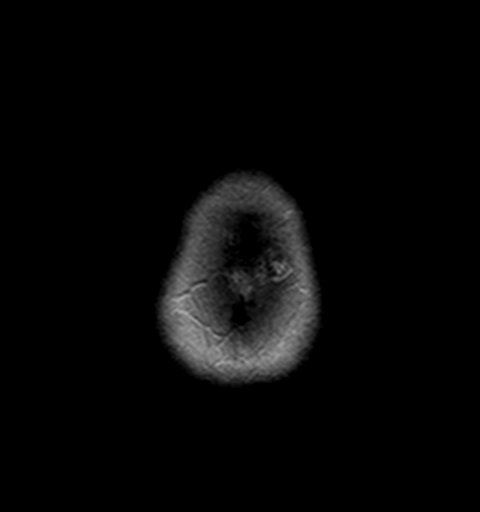

[Series 5002: ADC · axial · 5.0mm · 1.80mm/px · z∈[-56,+85]mm · 5 of 24 slices shown]
[im 1/24]
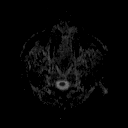
[im 6/24]
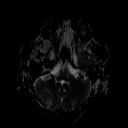
[im 12/24]
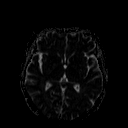
[im 18/24]
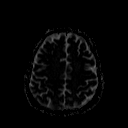
[im 24/24]
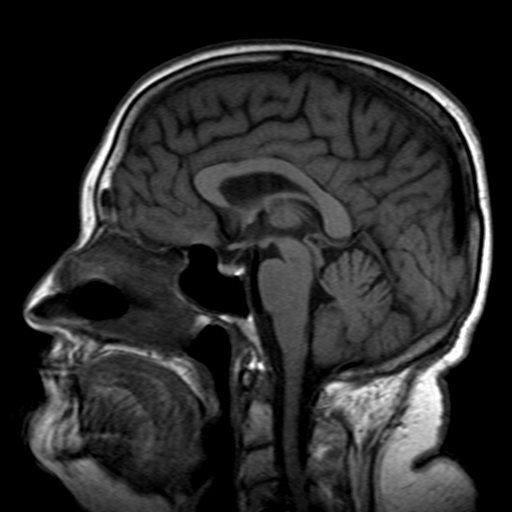

[Series 5003: DWI · axial · 5.0mm · 1.80mm/px · z∈[-56,+83]mm · 5 of 23 slices shown]
[im 1/23]
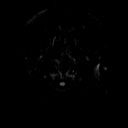
[im 6/23]
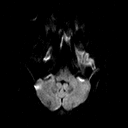
[im 12/23]
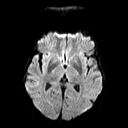
[im 17/23]
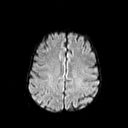
[im 23/23]
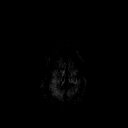

[30 of 48 positions shown; findings below may reference images not displayed]

PROCEDURE:     MR  - MR BRAIN WO/W CONTRAST  - March 02, 2008  [DATE]

RESULT:     Comparison is made to a prior study dated 07/11/2007.

Multiplanar and multisequence imaging of the brain was obtained pre-and-post
intravenous administration of 16 liters IV Magnevist.

Evaluation of the diffusion-weighted imaging demonstrates no evidence of
increased intensity to suggest sequela of an acute region of infarction. An
area of decreased signal is partially identified along the periphery of the
RIGHT cerebellar hemisphere which demonstrates increased signal on the ADC
mapping, increased T2 signal, decreased T1 signal, and no evidence of
significant enhancement. When compared to the previous study, these findings
are unchanged and are consistent with the patient's history of prior
surgery.  There does not appear to be evidence of abnormal parenchymal
enhancement nor enhancing masses.   There is no evidence of further
intraaxial nor extraaxial fluid collections. Areas of intermediate T2 signal
are identified within subcortical deep and periventricular white matter
regions. The sella and parasellar regions and structures, as well as the
cerebellopontine angle regions and visualized portions of the seventh and
eighth cranial nerves and internal auditory canal regions demonstrate no
signal nor enhancement abnormalities. As stated above, there is no evidence
of abnormal parenchymal enhancement nor enhancing masses. Vascular
flow-voids appear unremarkable.  The remaining portions of the cerebellum,
pons, and mid brain demonstrate no signal nor enhancement abnormalities.
IMPRESSION: Postsurgical and involutional changes without evidence of
focal or acute abnormalities.

## 2010-01-29 IMAGING — CR DG HUMERUS 2V *R*
1 series · 3 of 3 positions shown · non-contrast
Comparison: none

REASON FOR EXAM: fall
COMMENTS:   LMP: Post Hysterectomy

PROCEDURE:     DXR - DXR HUMERUS RIGHT  - May 18, 2008 [DATE]
RESULT:     Comparison is made to prior study dated 02-20-08.
There does not appear to be evidence of fracture, dislocation or
malalignment.

[Series 1: view not recorded · 0.17mm/px · 3 of 3 slices shown]
[im 1/3]
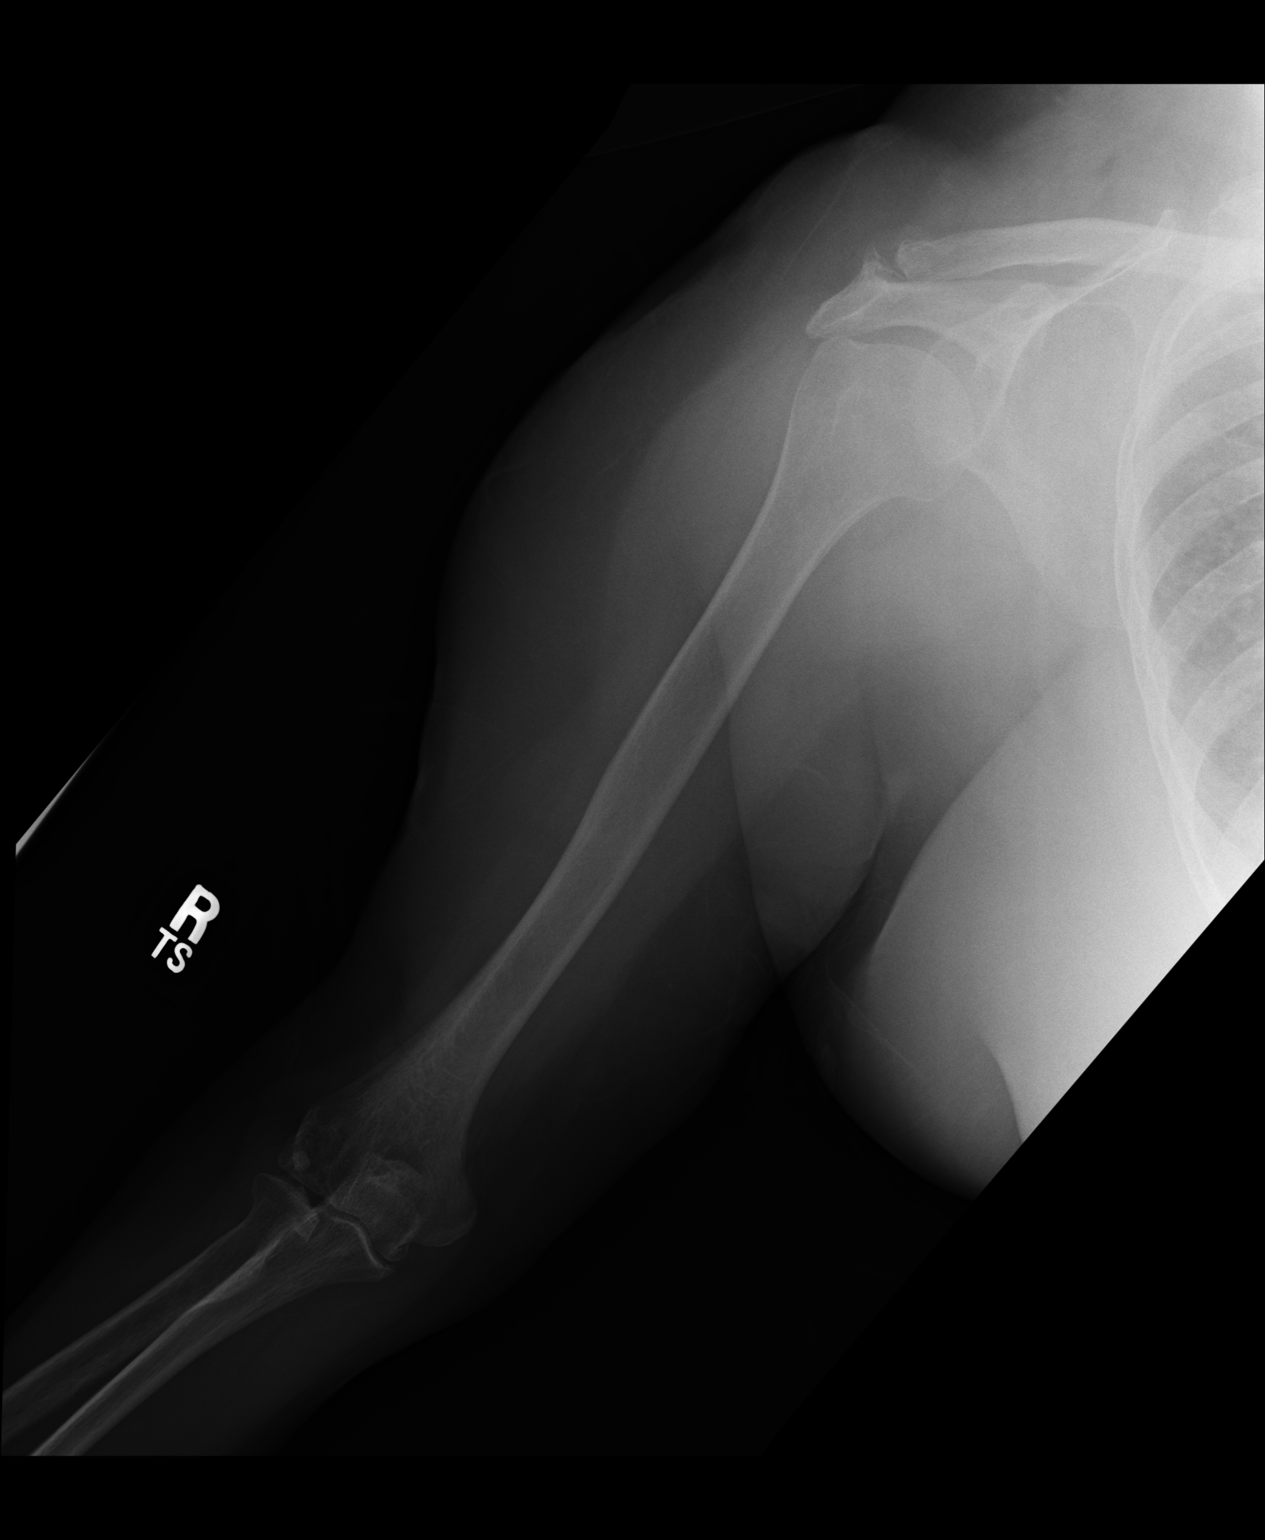
[im 2/3]
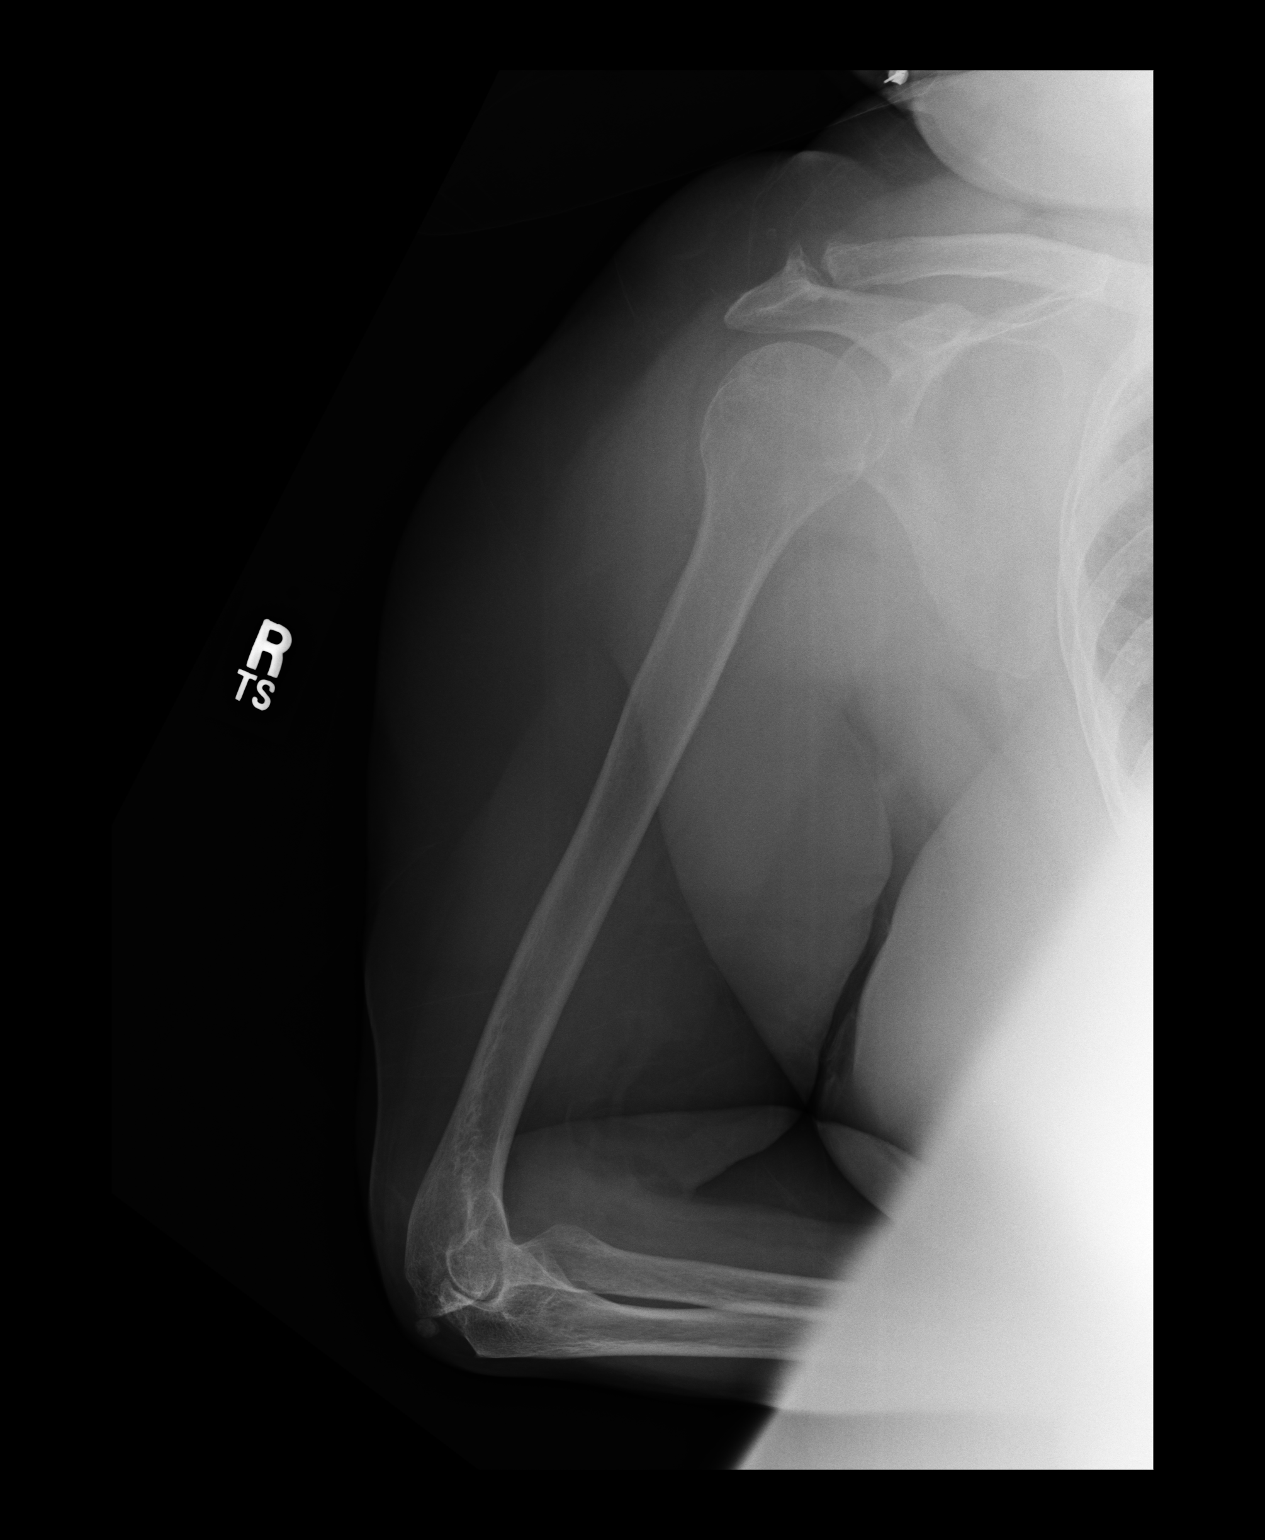
[im 3/3]
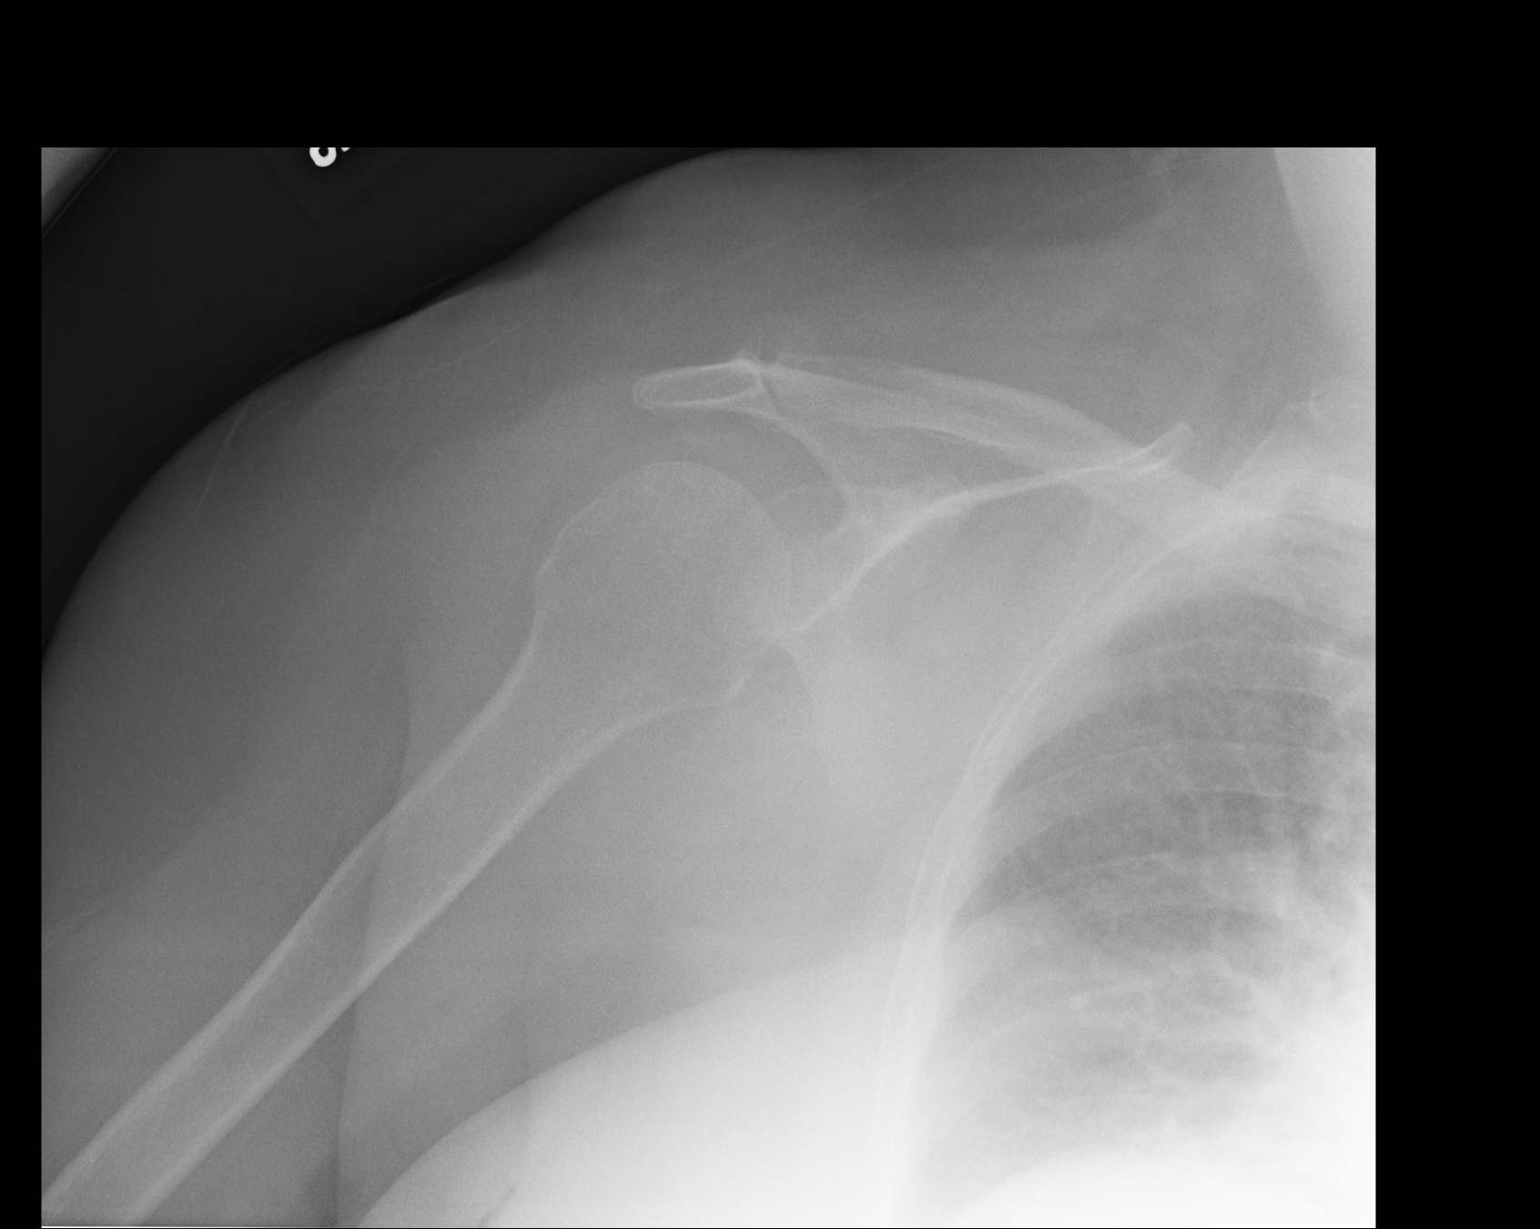

[3 of 3 positions shown; findings below may reference images not displayed]

IMPRESSION: 1. Unremarkable RIGHT humerus. If there is persistent clinical concern or
persistent complaints of pain, repeat evaluation in 7-10 days is recommended
if clinically warranted.

## 2010-02-23 ENCOUNTER — Inpatient Hospital Stay: Payer: Self-pay | Admitting: Internal Medicine

## 2010-03-02 ENCOUNTER — Emergency Department: Payer: Self-pay | Admitting: Unknown Physician Specialty

## 2010-03-25 ENCOUNTER — Ambulatory Visit: Payer: Self-pay | Admitting: Neurosurgery

## 2010-03-25 ENCOUNTER — Ambulatory Visit: Payer: Self-pay | Admitting: Internal Medicine

## 2010-03-27 ENCOUNTER — Ambulatory Visit: Payer: Self-pay | Admitting: Internal Medicine

## 2010-04-16 ENCOUNTER — Ambulatory Visit: Payer: Self-pay | Admitting: Surgery

## 2010-04-17 IMAGING — CR DG CHEST 1V PORT
1 series · 1 of 1 positions shown · non-contrast
Comparison: none

REASON FOR EXAM: cp
COMMENTS:

PROCEDURE:     DXR - DXR PORTABLE CHEST SINGLE VIEW  - August 04, 2008  [DATE]
RESULT:     Comparison: 01/20/2006

[view not recorded]
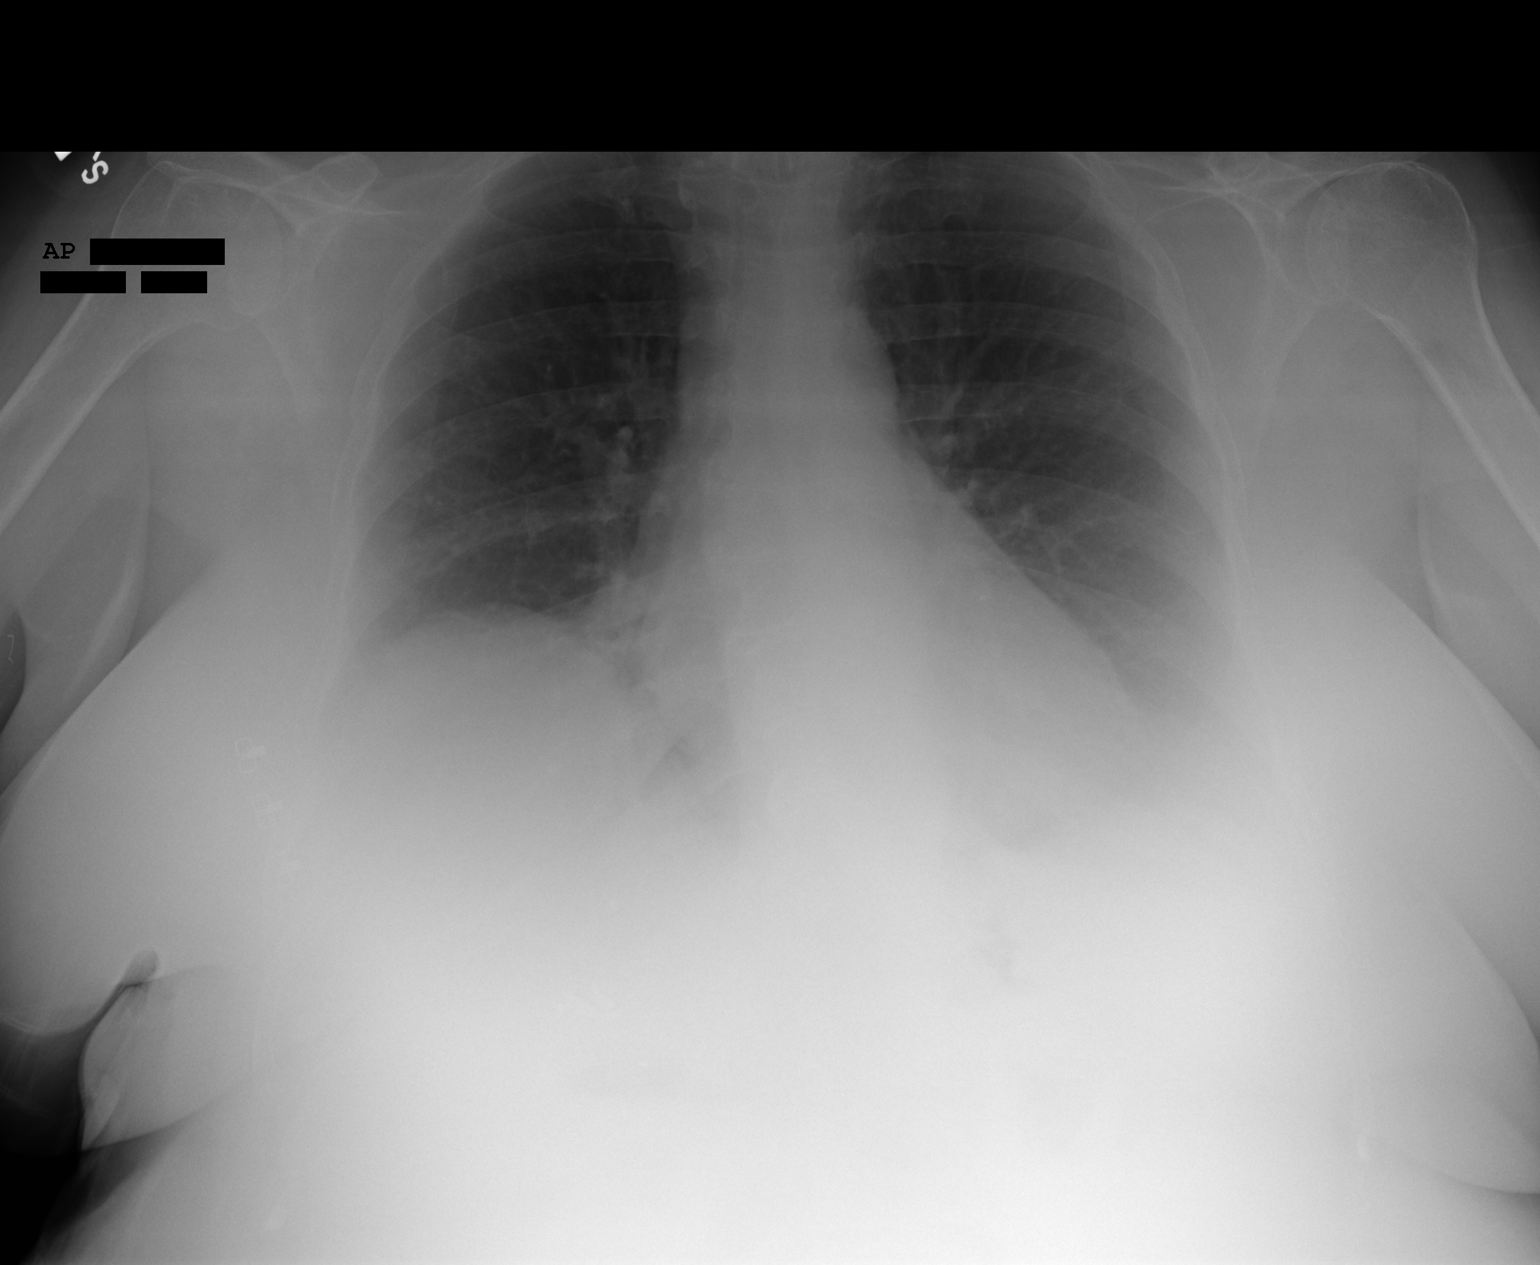

[1 of 1 positions shown; findings below may reference images not displayed]

FINDINGS: Single portable AP chest radiograph is provided. There is elevation of the
right hemidiaphragm, unchanged from the prior exam. There is no focal
parenchymal opacity, pleural effusion, or pneumothorax. Normal
cardiomediastinal silhouette. The osseous structures are unremarkable.
IMPRESSION: No acute disease of the chest.

## 2010-04-29 ENCOUNTER — Ambulatory Visit: Payer: Self-pay | Admitting: Surgery

## 2010-05-02 ENCOUNTER — Ambulatory Visit: Payer: Self-pay

## 2010-05-05 ENCOUNTER — Ambulatory Visit: Payer: Self-pay | Admitting: Surgery

## 2010-05-09 LAB — PATHOLOGY REPORT

## 2010-06-03 ENCOUNTER — Ambulatory Visit (HOSPITAL_BASED_OUTPATIENT_CLINIC_OR_DEPARTMENT_OTHER): Admission: RE | Admit: 2010-06-03 | Discharge: 2010-06-04 | Payer: Self-pay | Admitting: Orthopedic Surgery

## 2010-08-31 IMAGING — MR MRI HEAD WITHOUT AND WITH CONTRAST
7 of 9 series · 33 of 48 positions shown · non-contrast
Comparison: none

REASON FOR EXAM: FU brain tumor and  headaches
COMMENTS:  [phone]

[Series 2: t1_se_sag · sagittal · 5.0mm · 0.45mm/px · 6 of 26 slices shown]
[im 1/26]
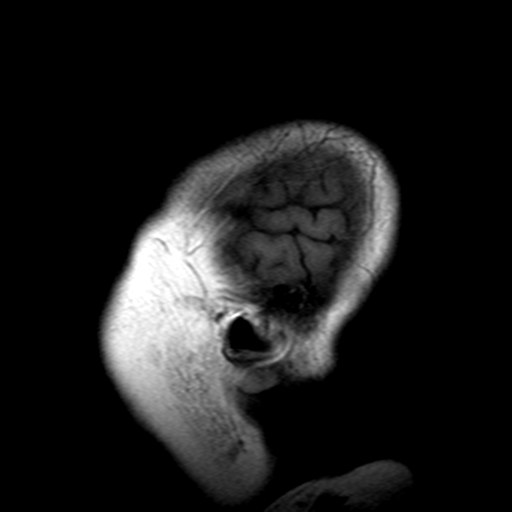
[im 6/26]
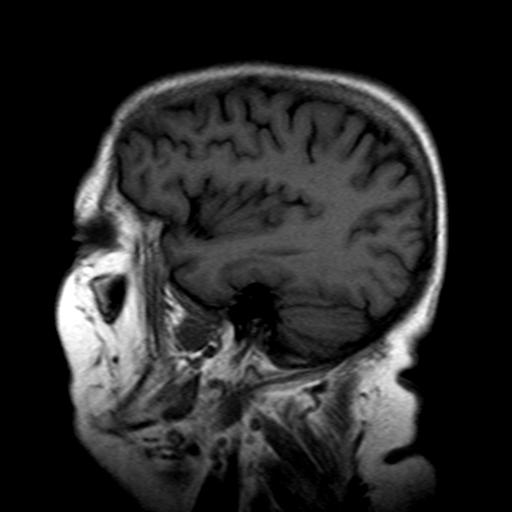
[im 11/26]
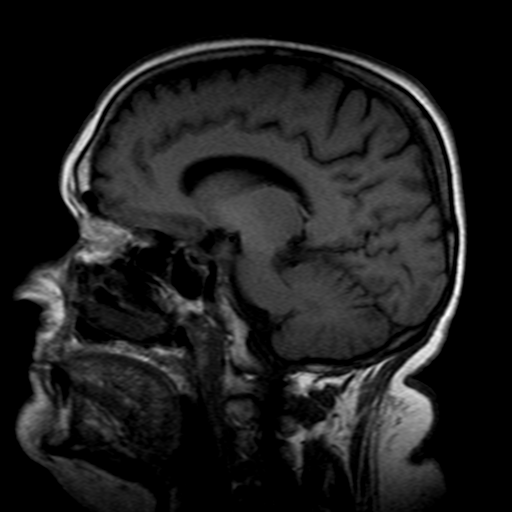
[im 16/26]
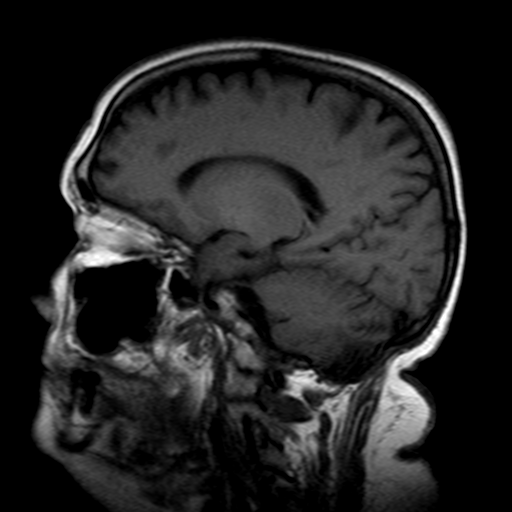
[im 21/26]
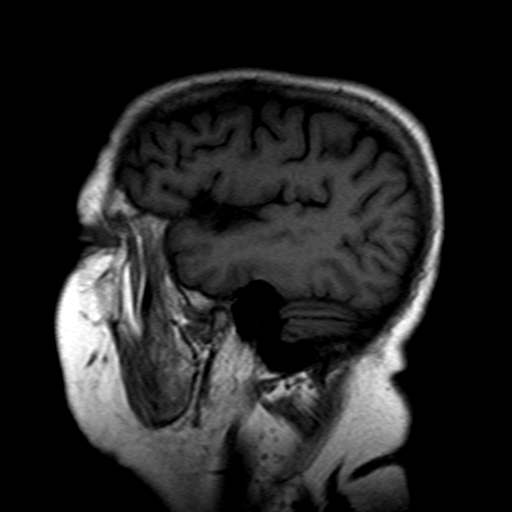
[im 26/26]
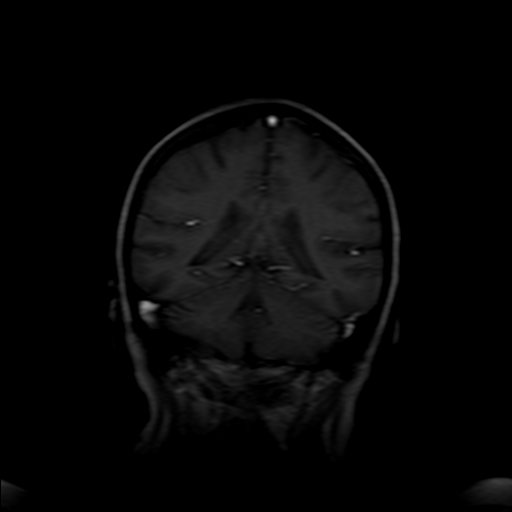

[Series 10: t1_se_axial · axial · 5.0mm · 0.45mm/px · z∈[-13,+18]mm · 2 of 24 slices shown]
[im 1/24]
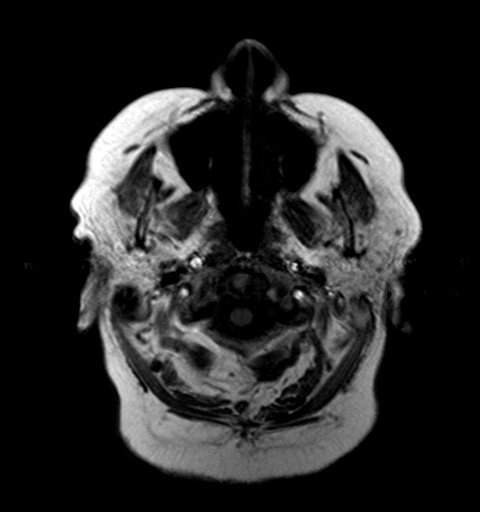
[im 6/24]
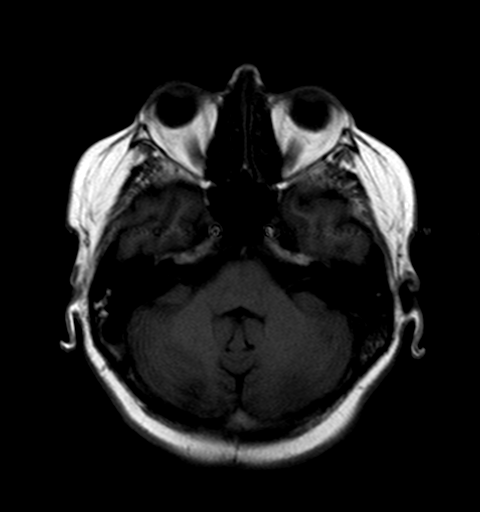

[Series 15: T2 · axial · 5.0mm · 0.45mm/px · z∈[-14,+122]mm · 5 of 24 slices shown]
[im 1/24]
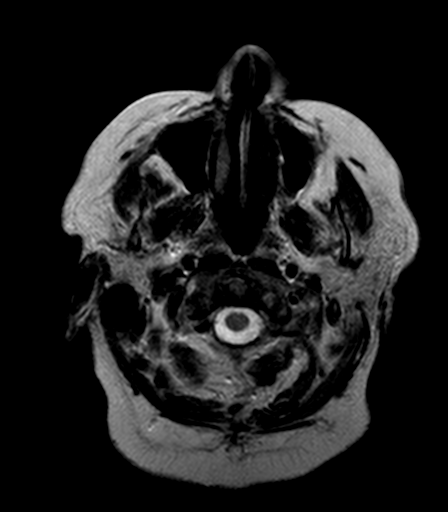
[im 6/24]
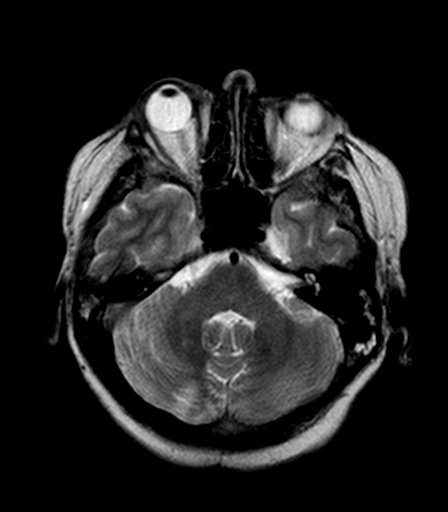
[im 12/24]
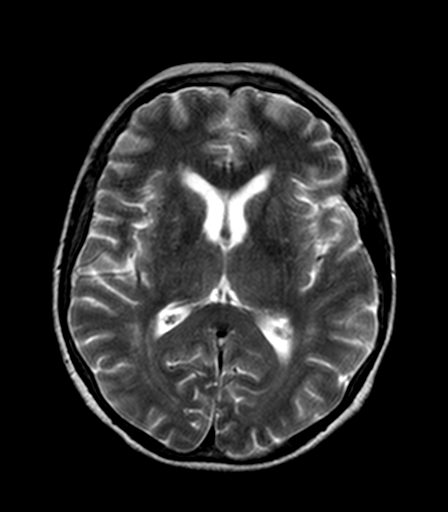
[im 18/24]
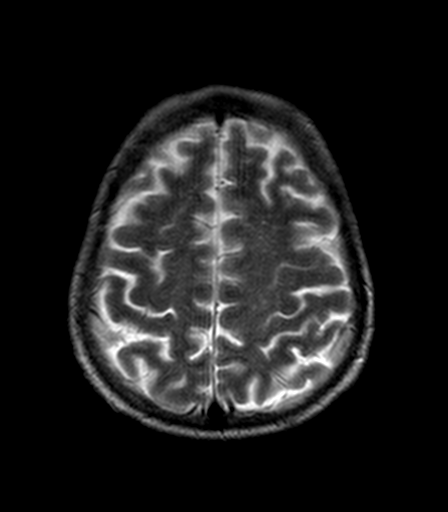
[im 24/24]
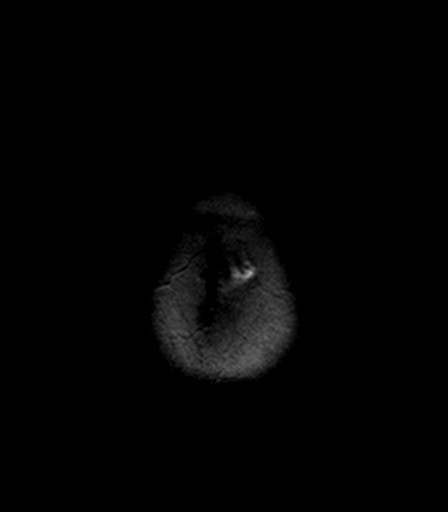

[Series 16: FLAIR · axial · 5.0mm · 0.90mm/px · z∈[-15,+121]mm · 5 of 24 slices shown]
[im 1/24]
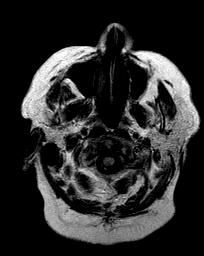
[im 6/24]
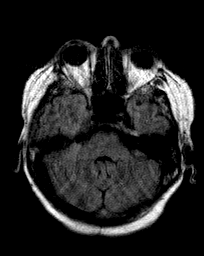
[im 12/24]
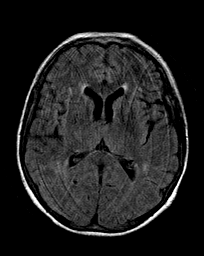
[im 18/24]
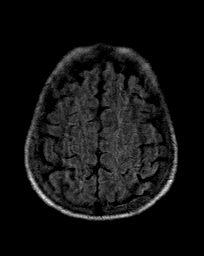
[im 24/24]
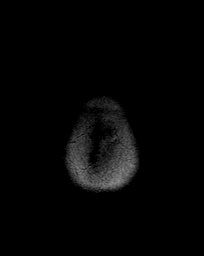

[Series 22: T1 · axial · 5.0mm · 0.45mm/px · z∈[-13,+123]mm · 5 of 24 slices shown]
[im 1/24]
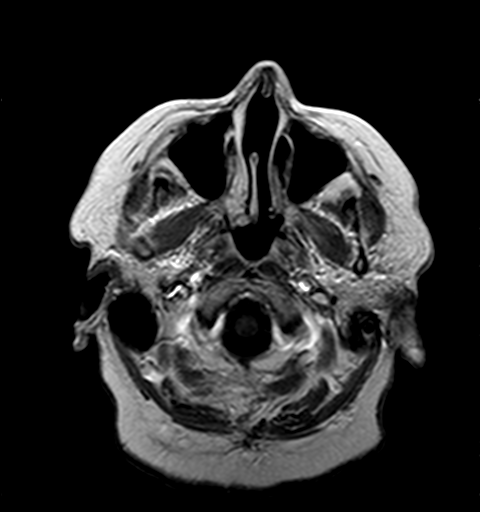
[im 6/24]
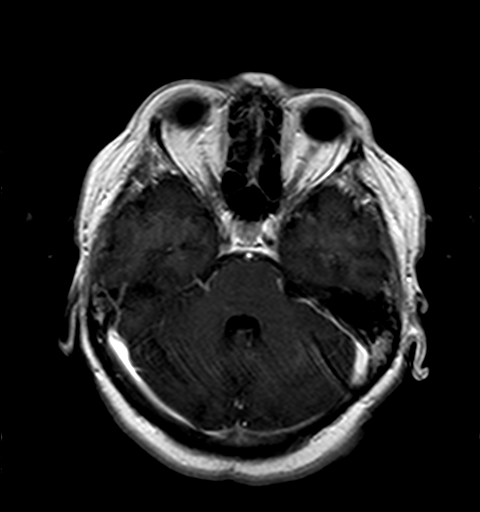
[im 12/24]
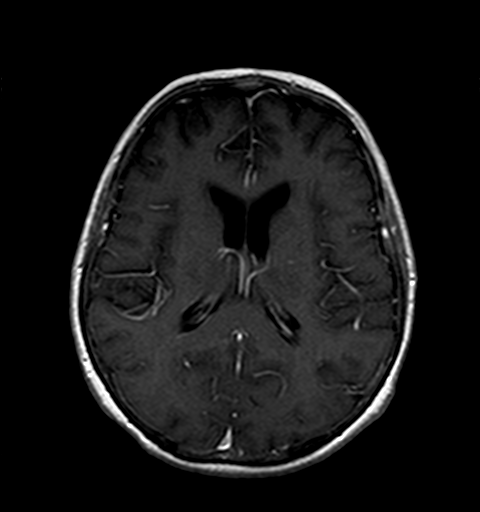
[im 18/24]
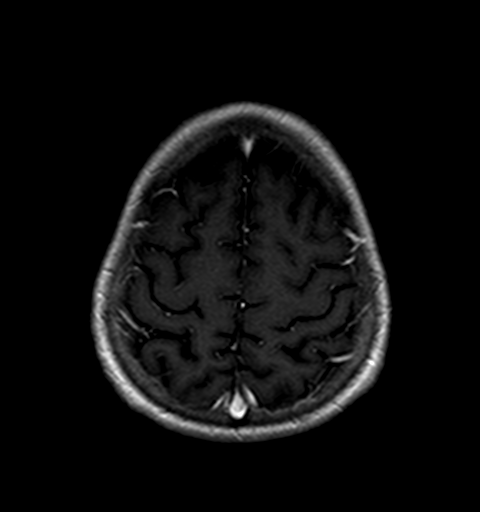
[im 24/24]
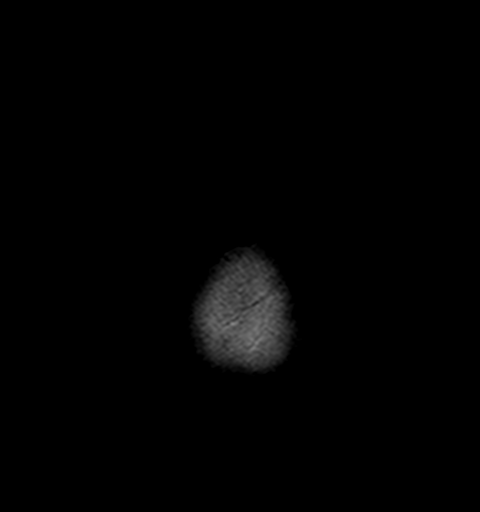

[Series 5002: DWI · axial · 5.0mm · 1.80mm/px · z∈[-12,+124]mm · 5 of 24 slices shown]
[im 1/24]
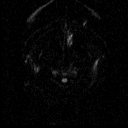
[im 6/24]
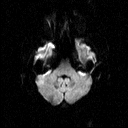
[im 12/24]
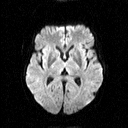
[im 18/24]
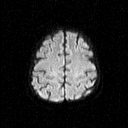
[im 24/24]
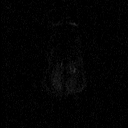

[Series 5003: ADC · axial · 5.0mm · 1.80mm/px · z∈[-12,+124]mm · 5 of 24 slices shown]
[im 1/24]
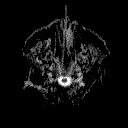
[im 6/24]
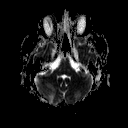
[im 12/24]
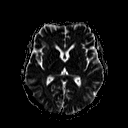
[im 18/24]
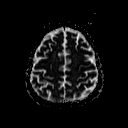
[im 24/24]
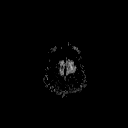

[33 of 48 positions shown; findings below may reference images not displayed]

PROCEDURE:     MR  - MR BRAIN WO/W CONTRAST  - December 18, 2008 [DATE]

RESULT:     Multiplanar/multisequence imaging of the brain is obtained pre
and post intravenous administration of 15 ml of IV Magnevist.

Comparison is made to a prior study dated 03/02/2008.

Evaluation of the diffusion weighted imaging demonstrates no areas of
increased signal intensity. A vague area of decreased signal is identified
once again in the right cerebellar hemisphere and demonstrates vague
increased signal on ADC mapping and is consistent with the patient's
reported history of prior surgery. This area demonstrates diffuse increased
T2 signal and no evidence of abnormal parenchymal enhancement. There is no
evidence of abnormal parenchymal enhancement or enhancing masses or nodules.
No further intra-axial or extra-axial fluid collections are identified.
There are mild areas of increased T2 signal within the subcortical deep and
periventricular white matter regions. The sella and parasellar regions and
structures as well as the cerebellopontine angle regions and visualized
portions of the seventh and eighth cranial nerves demonstrate no signal or
enhancement abnormalities. The pons and midbrain demonstrate no signal or
enhancement abnormalities.
IMPRESSION: Post surgical involutional changes without evidence of focal or
acute abnormalities. These findings are stable when compared to the previous
study.

## 2010-09-16 ENCOUNTER — Ambulatory Visit: Payer: Self-pay

## 2010-09-29 ENCOUNTER — Ambulatory Visit: Payer: Self-pay | Admitting: Gastroenterology

## 2010-10-22 LAB — POCT I-STAT, CHEM 8
BUN: 12 mg/dL (ref 6–23)
Calcium, Ion: 1.09 mmol/L — ABNORMAL LOW (ref 1.12–1.32)
Chloride: 104 mEq/L (ref 96–112)
Creatinine, Ser: 0.8 mg/dL (ref 0.4–1.2)
Glucose, Bld: 218 mg/dL — ABNORMAL HIGH (ref 70–99)
HCT: 40 % (ref 36.0–46.0)
Hemoglobin: 13.6 g/dL (ref 12.0–15.0)
Potassium: 3.7 mEq/L (ref 3.5–5.1)
Sodium: 139 mEq/L (ref 135–145)
TCO2: 25 mmol/L (ref 0–100)

## 2010-10-22 LAB — GLUCOSE, CAPILLARY: Glucose-Capillary: 261 mg/dL — ABNORMAL HIGH (ref 70–99)

## 2010-11-15 ENCOUNTER — Emergency Department: Payer: Self-pay | Admitting: Emergency Medicine

## 2010-12-01 IMAGING — CR RIGHT ANKLE - COMPLETE 3+ VIEW
1 series · 5 of 5 positions shown · non-contrast
Comparison: none

REASON FOR EXAM: pain/edema 2nd to fall
COMMENTS:

[Series 1: view not recorded · 0.17mm/px · 5 of 5 slices shown]
[im 1/5]
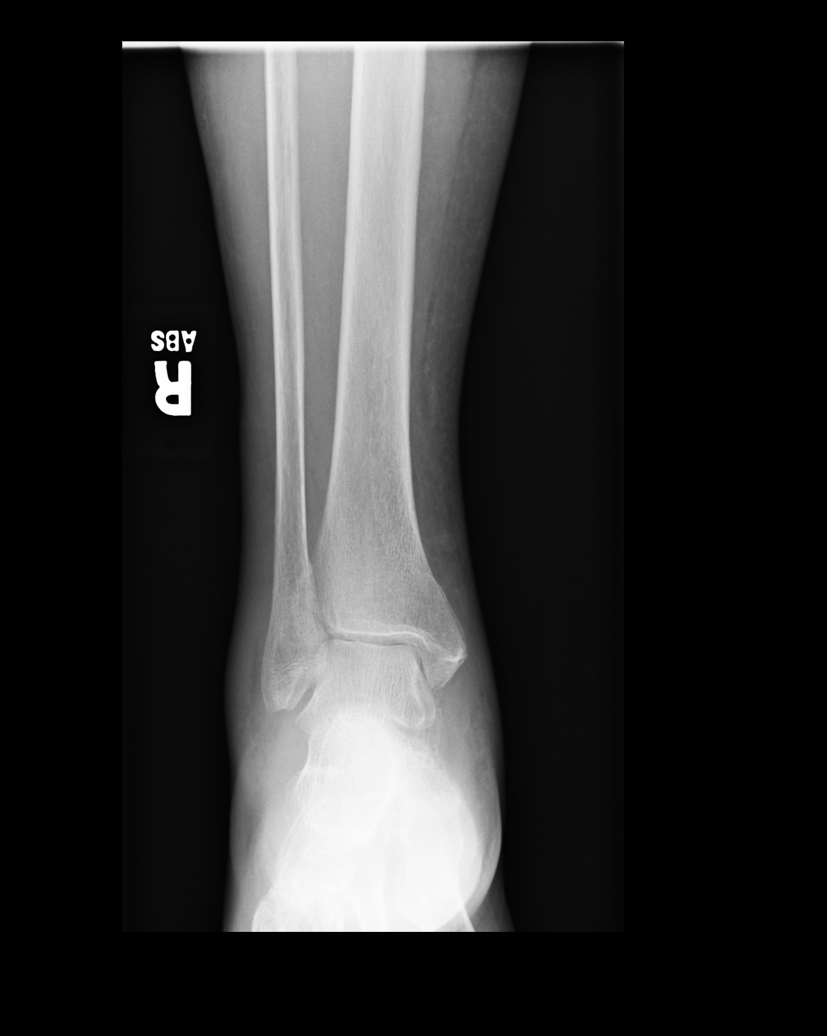
[im 2/5]
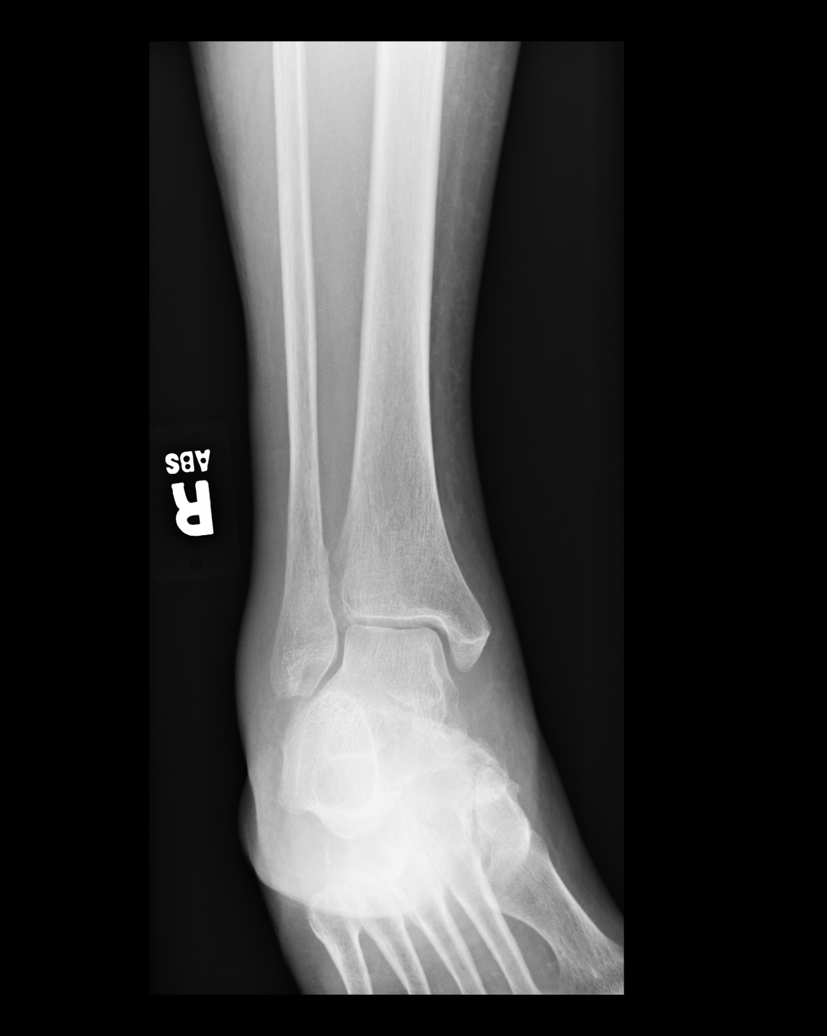
[im 3/5]
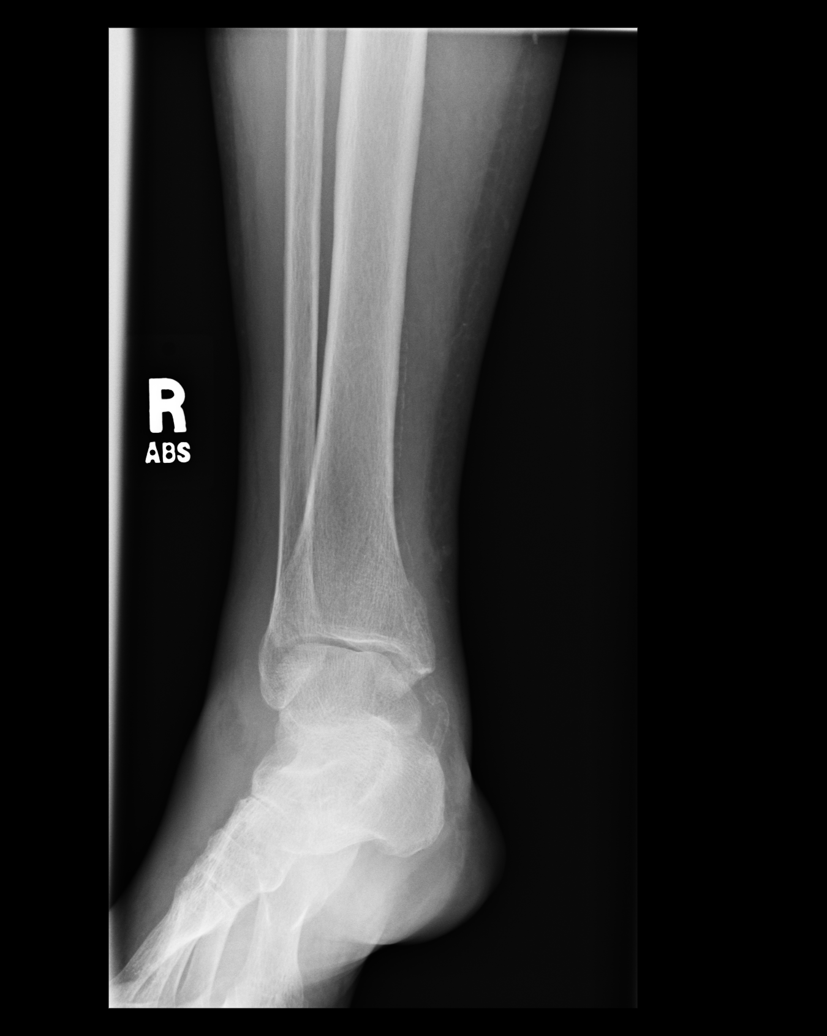
[im 4/5]
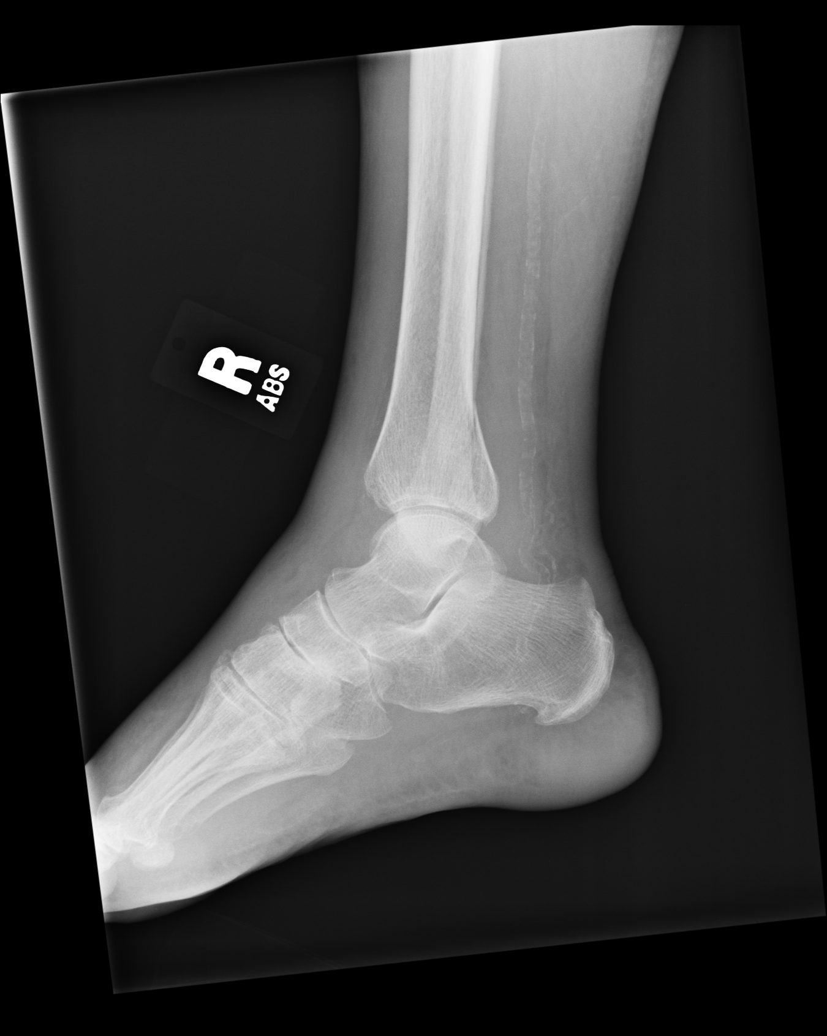
[im 5/5]
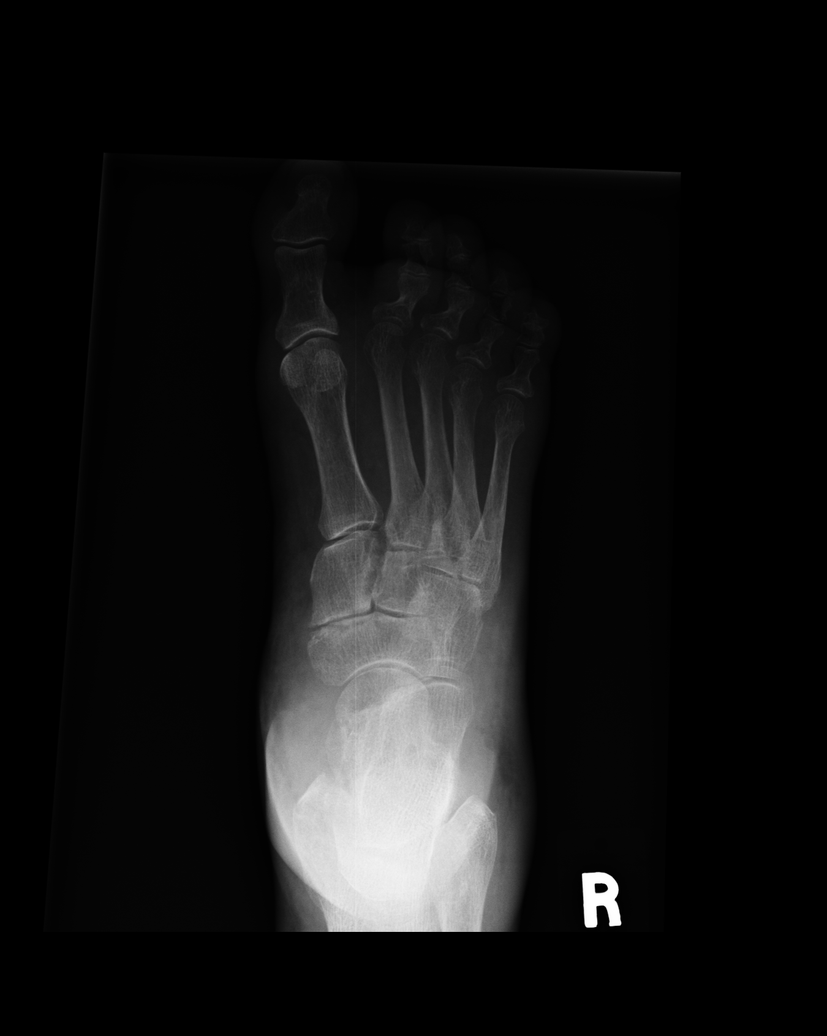

[5 of 5 positions shown; findings below may reference images not displayed]

PROCEDURE:     DXR - DXR ANKLE RIGHT COMPLETE  - March 20, 2009 [DATE]

RESULT:     There is slight irregularity of the posterior cortical margin of
the distal tibia as visualized in the lateral view. In one oblique view of
the ankle there is a lucency projected over the distal fibula. Each of these
conceivably could represent fracture lines but both could be spurious.
Further evaluation of the ankle by CT is recommended. The talus and
calcaneus are intact. There is a small plantar calcaneal spur. Calcification
is noted in the posterior tibial artery.
IMPRESSION: There are questionable fractures of the distal tibia and
distal fibula. This could be further evaluated by CT.

## 2010-12-26 NOTE — Discharge Summary (Signed)
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  She had difficulties  with dizziness, posterior cranial headaches, nausea, imbalance and blurred  vision.  This has been going on for at least 2-4 months.  MRI revealed an  enhancing mass lesion in the right cerebellar hemisphere, the nature of  which was uncertain.  CT scan of the chest and abdomen were negative.   The patient has been scheduled for surgery the day of admission, however her  preoperative laboratories revealed a serum blood sugar 493 on the day of  admission, therefore surgery was cancelled.  The patient was admitted for  treatment and management of her hyperglycemia.   PAST MEDICAL HISTORY:  Notable for rheumatoid arthritis for 15 years treated  with prednisone and Ridaura, hypertension for 3 years and diabetes for 8  years.   PAST SURGICAL HISTORY:  Hysterectomy, cholecystectomy, C-section, and  bilateral knee replacements none of which were done locally.   ALLERGIES:  SHE REPORTED AN ALLERGY TO Meghan [name] WHICH SLOWED HER HEART BEAT  BUT IN FACT WAS TAKING Meghan [name].   PHYSICAL EXAMINATION:  Further details of her admission history and physical  examination included in her admission note.  General examination showed an  obese white female in no acute distress.  General examination was  unremarkable.  Neurological exam showed that she was awake, alert, full.  Cranial nerves were intact.  Motor function was  intact.  Sensation was  intact and she had no dysmetria or dictyokinesis.   HOSPITAL COURSE:  The patient was admitted, started on insulin dose  intravenously and subcutaneously.  Were able to bring her blood sugar down  to a more normal range.  Endocrinologic consultation was obtained from Dr.  Casimiro Needle [place] who continued to follow the patient throughout her  hospitalization.  The morning following her admission, her blood sugar had  normalized and therefore we proceeded with surgery, a right suboccipital  craniectomy and gross total resection of tumor.  Pathology report described  meningioma, gross total resection of the tumor was achieved.  Following  surgery, the patient had moderate incisional pain that was controlled with  pain medications.  We added Flexeril for her comfort.  We began to get her  out of bed and she was able to progressively mobilize.  Blood sugars have  remained under good control and Dr. Rocky Crafts has been able to taper her  insulin support.  We began to taper her Decadron and she tolerated her  Decadron taper well.   Her incisions has healed well and her staples were to be  removed today prior  to discharge.  At this time she is tapered from her Decadron.  She does  continue on her usual prednisone 5 mg daily which she had been taking prior  to hospitalization.  Dr. Rocky Crafts is prescribing her glimepiride 4 mg daily  and Actos 30 mg daily.  He does not feel that she needs insulin at the time.  He has asked that she return to his office for followup in 2 weeks.  We have  given her a prescription for Percocet 1-2 q.i.d. p.r.n. pain 50 tablets, no  refills and Phenergan 12.5 mg q. 6 hours p.r.n. nausea and vomiting, 25  tablets with no refills.  She is to return to our office in about 2-3 weeks  or sooner if she has any difficulties.   DISCHARGE DIAGNOSES:  1.  Right cerebellar/posterior fossa meningioma.  2.  Diabetes mellitus.      Hewitt Shorts, M.D.   Electronically Signed     Meghan [name]/MEDQ  D:  02/25/2006  T:  02/25/2006  Job:  [id number]

## 2010-12-26 NOTE — Op Note (Signed)
NAMENOTNAMED, CROUCHER NO.:  [id number]   MEDICAL RECORD NO.:  [id number]          PATIENT TYPE:  INP   LOCATION:  3112                         FACILITY:  [facility]   PHYSICIAN:  Hewitt Shorts, M.D.DATE OF BIRTH:  [birth year]   DATE OF PROCEDURE:  DATE OF DISCHARGE:                                 OPERATIVE REPORT   PREOP DIAGNOSIS:  Right posterior fossa brain tumor.   POSTOP DIAGNOSIS:  Right posterior fossa brain tumor (extra-axial).   PROCEDURE:  Right suboccipital craniotomy and gross frontal resection of  tumor with microdissection.   SURGEON:  Dr. Newell Coral.   ASSISTANTS:  Venetia Maxon and Jerald Kief.   ANESTHESIA:  General endotracheal.   INDICATIONS:  The patient is a 65 year old woman who presented with  headaches, nausea, vomiting, dizziness and imbalance.  Found by MRI scan to  have a right posterior fossa brain tumor by MRI scan.  It was difficult to  determine whether the lesion was intra or extra-axial and the decision was  made to proceed with suboccipital craniectomy and resection of the tumor.   PROCEDURE:  The patient was brought to the operating room and placed under  endotracheal anesthesia.  The Mayfield head holder was applied to the skull  and the patient was turned to a prone position with the neck gently flexed.  The occipital scalp was shaved and then the occipital scalp and neck were  prepped with Betadine and Silva solution and draped in a sterile fashion.  The midline was infiltrated with local anesthetic with epinephrine and then  midline incision was made and carried down through the subcutaneous tissue.  Bipolar cautery and electrocautery was used to maintain hemostasis.  Dissection was carried down to the occipital bone and the right nuchal  musculature was elevated occipital bone.  Dissection was carried down to the  spinous process of C2 and the posterior ring of C1.  The muscle attachments  to the right side of the ring of C1 and to  the right side spinous process  and lamina of C2 were elevated.  Self-retaining retractors were placed and  the dissection was carried out laterally to the right side.  We exposed the  foramen magnum and the occipital mantel membrane.  We then began a  craniectomy using the XMax drill, double-action rongeurs and Kerrison  punches.  The right occipital bone was removed down to the level of the  foramen magnum and laterally and superiorly.  We then opened the medulla in  a diagonal manner from superolaterally to inferomedially to the level of the  foramen magnum.  The brain was noted to be pulsating well.  The cisterna  magna was opened.  CSF was aspirated and good relaxation of the brain was  achieved and good pulsations were noted.  As we examined the intracranial  cavity, the tumor was identified laterally to the right.  Cottonoid paddies  were placed over the brain surface.  The dura was tacked to either side.  The tumor itself was firm and appeared to be attached to the lateral dura  and to  be extra-axial.  A specimen of tumor was obtained by incising the  tumor with a 15 scalpel.  It was sent to pathology.  Dr. Guerry Bruin, the  pathologist on-call, examined the tissue, however he felt that it could not  be evaluated for frozen section because of significant calcification.  He  felt that it would need to be processed as a permanent specimen in formalin,  then decalcified and then further processed.  Additional specimen was  subsequently obtained and sent to pathology for permanent section as well.  We began to hollow out the tumor using the Cavitron ultrasonic aspirator.  Small bridging vessels that bridge between the peeled surface and the tumor  surface were coagulated and divided.  Despite the calcifications within the  tumor, we were able to debulk the tumor using the Cavitron ultrasonic  aspirator but we were able to obtain additional specimen as well for  permanent examination.   We then extended the craniectomy laterally directly  overlying the dural attachments of this tumor.  The tumor completely invaded  this lateral aspect of the dura and therefore the lateral dura was resected  and the area of the dura that was directly invaded and replaced by tumor was  removed.  We then waxed the edges of the bone thoroughly.  The peel surface  was coagulated as needed to establish hemostasis and we laid Surgicel over  the brain surface, as well as the more lateral dural surface.  Good  hemostasis was established.  Then we began our closure.   The operating microscope had been draped and brought into the field.  Once  the initial craniectomy had been completed and prior to the dural opening,  an entire intradural exploration and tumor resection was performed with the  assistance of the operating microscope and using microdissection and  microsurgical technique.   Because of the large dural defect, a large piece of DuraGen was placed over  the dural defect and then we proceeded with closure.  The nuchal muscles  were approximated with interrupted undyed 0 Vicryl sutures.  The fascial  layers were closed in multiple layers with intraoperative undyed 0 Vicryl  sutures.  The galea superiorly was closed with interrupted, inverted 2-0  Vicryl sutures and the subcutaneous layer/subcuticular closed inferiorly  with interrupted, inverted 2-0 Vicryl suture.  The skin is closed with  surgical staples.  The wound was dressed with Adaptic, sterile gauze and  HyperFix.  The procedure was tolerated well.   ESTIMATED BLOOD LOSS:  100 cc.   Sponge counts were correct.   Following the surgery, the patient was turned back to the supine position,  the Mayfield head hold was removed.  The patient is to be reversed from the  anesthetic, extubated and transferred to the recovery room for further care.     Hewitt Shorts, M.D.  Electronically Signed     RWN/MEDQ  D:  02/18/2006   T:  02/19/2006  Job:  [id number]

## 2010-12-26 NOTE — H&P (Signed)
Meghan [name], [name].:  [id number]   MEDICAL RECORD NO.:  [id number]          PATIENT TYPE:  INP   LOCATION:  3172                         FACILITY:  [facility]   PHYSICIAN:  Hewitt Shorts, M.D.DATE OF BIRTH:  [birth year]   DATE OF ADMISSION:  02/17/2006  DATE OF DISCHARGE:                                HISTORY & PHYSICAL   HISTORY OF PRESENT ILLNESS:  The patient is a 65 year old right-handed white  female who is admitted for treatment of a right cerebellar brain tumor which  was diagnosed during a workup at [facility] last  month.  She had been having difficulties with dizziness, posterior cranial  headaches, nausea with eating, bending and activity, poor balance and  blurred vision.  She explained that these symptoms have been going on for at  least the past 2-4 months.   MRI scan revealed an enhancing mass lesion in the right cerebellar  hemisphere, the nature of which was uncertain.  Additional workup included  CT scans of the chest and abdomen which did not reveal any significant  pathology.   The patient was scheduled for admission today with surgery scheduled for  today; however, her preoperative laboratories revealed a blood sugar this  morning of 493 and the patient's surgery was cancelled and she is admitted  now for treatment and management of her hyperglycemia and surgery is  tentatively rescheduled for tomorrow.   PAST MEDICAL HISTORY:  Past medical history is notable for:  1.  History of rheumatoid arthritis for the past 15 years, treated with      prednisone and Ridaura.  2.  History of hypertension for the last 3 years.  3.  History of diabetes for the past 8 years.   She does not describe any history of myocardial infarction, previous  diagnosis of cancer, stroke, peptic ulcer disease or lung disease.   PREVIOUS SURGICAL HISTORY:  1.  Hysterectomy in 1995 at [facility].  2.  Cholecystectomy in 1986 at [facility].  3.  A C5-6 ACDF in 1998 at [facility].  4.  Bilateral knee replacements by Dr. Deeann Saint in May and November of      1995.   ALLERGIES:  She reports an allergic reaction to NAPROSYN, which slowed her  heart beat.   CURRENT MEDICATIONS:  1.  Prednisone 5 mg daily.  2.  Ridaura 3 mg b.i.d.  3.  Diovan/hydrochlorothiazide 160/12.5 mg b.i.d.  4.  Fosamax 70 mg weekly.  5.  Xanax 0.25 mg b.i.d. p.r.n.  6.  Oxycodone 5/325 mg one q.6 h. p.r.n. pain.  7.  Lexapro 10 mg p.o. daily.  8.  Phenergan 12.5 mg to 25 mg p.o. t.i.d. p.r.n. nausea or vomiting.   FAMILY HISTORY:  Mother is in fair health at age 30 with a history of heart  disease, arthritis and hypertension.  She has no information regarding her  father.   SOCIAL HISTORY:  The patient is divorced.  She is on disability because of  her rheumatoid arthritis.  She does not smoke, drink alcoholic beverages  or  have a history of substance abuse.   REVIEW OF SYSTEMS:  Review of systems is notable for those difficulties  described in her history of present illness and past medical history.  She  does describe some difficulties with anxiety and depression.  Also, she  notes an 18-pound weight loss over the past 2 months.  She explains that her  blood sugar typically runs by Accu-Cheks 120-130 and she uses Glipizide ER 5  mg p.r.n. for hyperglycemia.  Review of systems is otherwise unremarkable.   PHYSICAL EXAMINATION:  GENERAL:  The patient is an obese white female in no  acute distress.  VITAL SIGNS:  Her height is 4 feet 11 inches, weight 163 pounds.  Temperature 98.4, pulse 99, blood pressure 126/71, respiratory rate of 18.  LUNGS:  Clear to auscultation.  She has symmetric respiratory excursion.  HEART:  Regular rate and rhythm.  Normal S1 and S2.  There is no murmur.  ABDOMEN:  Soft and non-distended.  Bowel sounds are present.  EXTREMITIES:  Examination shows no clubbing, cyanosis or edema.  NEUROLOGICAL:   Examination shows the patient awake, alert and fully  oriented.  Her speech is fluent and she has good comprehension.  Cranial  nerves reveal the pupils to be equal, round and reactive to light and about  3 mm bilaterally.  Extraocular movements are intact.  She has no nystagmus.  Facial movement is symmetrical.  Hearing is present bilaterally.  Palatal  movement is symmetrical.  Shoulder shrug is symmetrical and tongue is  midline.  Motor examination shows 5/5 strength in the upper and lower  extremities including the deltoids, biceps, triceps, intrinsics, grips,  iliopsoas, quadriceps, dorsiflexors, extensor hallucis longi and  plantarflexors bilaterally.  She has no drift of the upper extremities.  Sensation is intact to pinprick to the upper and lower extremities.  Reflexes are 1-2 in the biceps, brachialis and triceps and absent in the  quadriceps bilaterally.  The left gastrocnemius is absent; the right is 2.  The toes are downgoing bilaterally.  She has a normal gait, but mild  unsteadiness on tandem gait.  Cerebellar testing shows no dysmetria on  finger-to-nose testing or heel-to-shin testing, no dysdiadochokinesia on  rapid alternating movements.   DIAGNOSTIC STUDIES:  MRI of the brain done without and with gadolinium shows  an enhancing mass lesion in the right cerebellum.  There is no  hydrocephalus.  There is no associated edema.  The nature of the tumor is  uncertain and it appears to be a solitary lesion.   IMPRESSION:  1.  Right cerebellar enhancing mass causing headaches, dizziness, nausea and      imbalance; the nature of the tumor is uncertain; no obvious primary      tumor is seen.  2.  Diabetes mellitus with significant hyperglycemia on admission,      chemistries showing a blood sugar of 493.  3.  Rheumatoid arthritis, on prednisone and Ridaura.  PLAN:  The patient had been scheduled for surgery today; however, because of  her significant hyperglycemia, surgery  was cancelled and she is being  admitted for metabolic correction with control of her blood sugar,  collection of her hypokalemia (potassium 3.3) and her hyponatremia (sodium  133).  Endocrinologic consultation has been  requested from Dr. Dagoberto Ligas, who will either see the patient or have one of  his partners see the patient.  The patient is tentatively rescheduled for  surgery tomorrow.  All of this has been discussed  thoroughly with the  patient and her family.  Hemoglobin A1c has been ordered.  The patient has  been started on a sliding scale.      Hewitt Shorts, M.D.  Electronically Signed     RWN/MEDQ  D:  02/17/2006  T:  02/17/2006  Job:  [id number]

## 2011-08-13 ENCOUNTER — Ambulatory Visit: Payer: Self-pay | Admitting: Internal Medicine

## 2011-11-06 IMAGING — CR DG CHEST 2V
1 series · 2 of 2 positions shown · non-contrast
Comparison: none

REASON FOR EXAM: SOB
COMMENTS:

PROCEDURE:     DXR - DXR CHEST PA (OR AP) AND LATERAL  - February 23, 2010  [DATE]
RESULT:     Comparison: 08/04/2008.

[Series 1: view not recorded · 0.17mm/px · 2 of 2 slices shown]
[im 1/2]
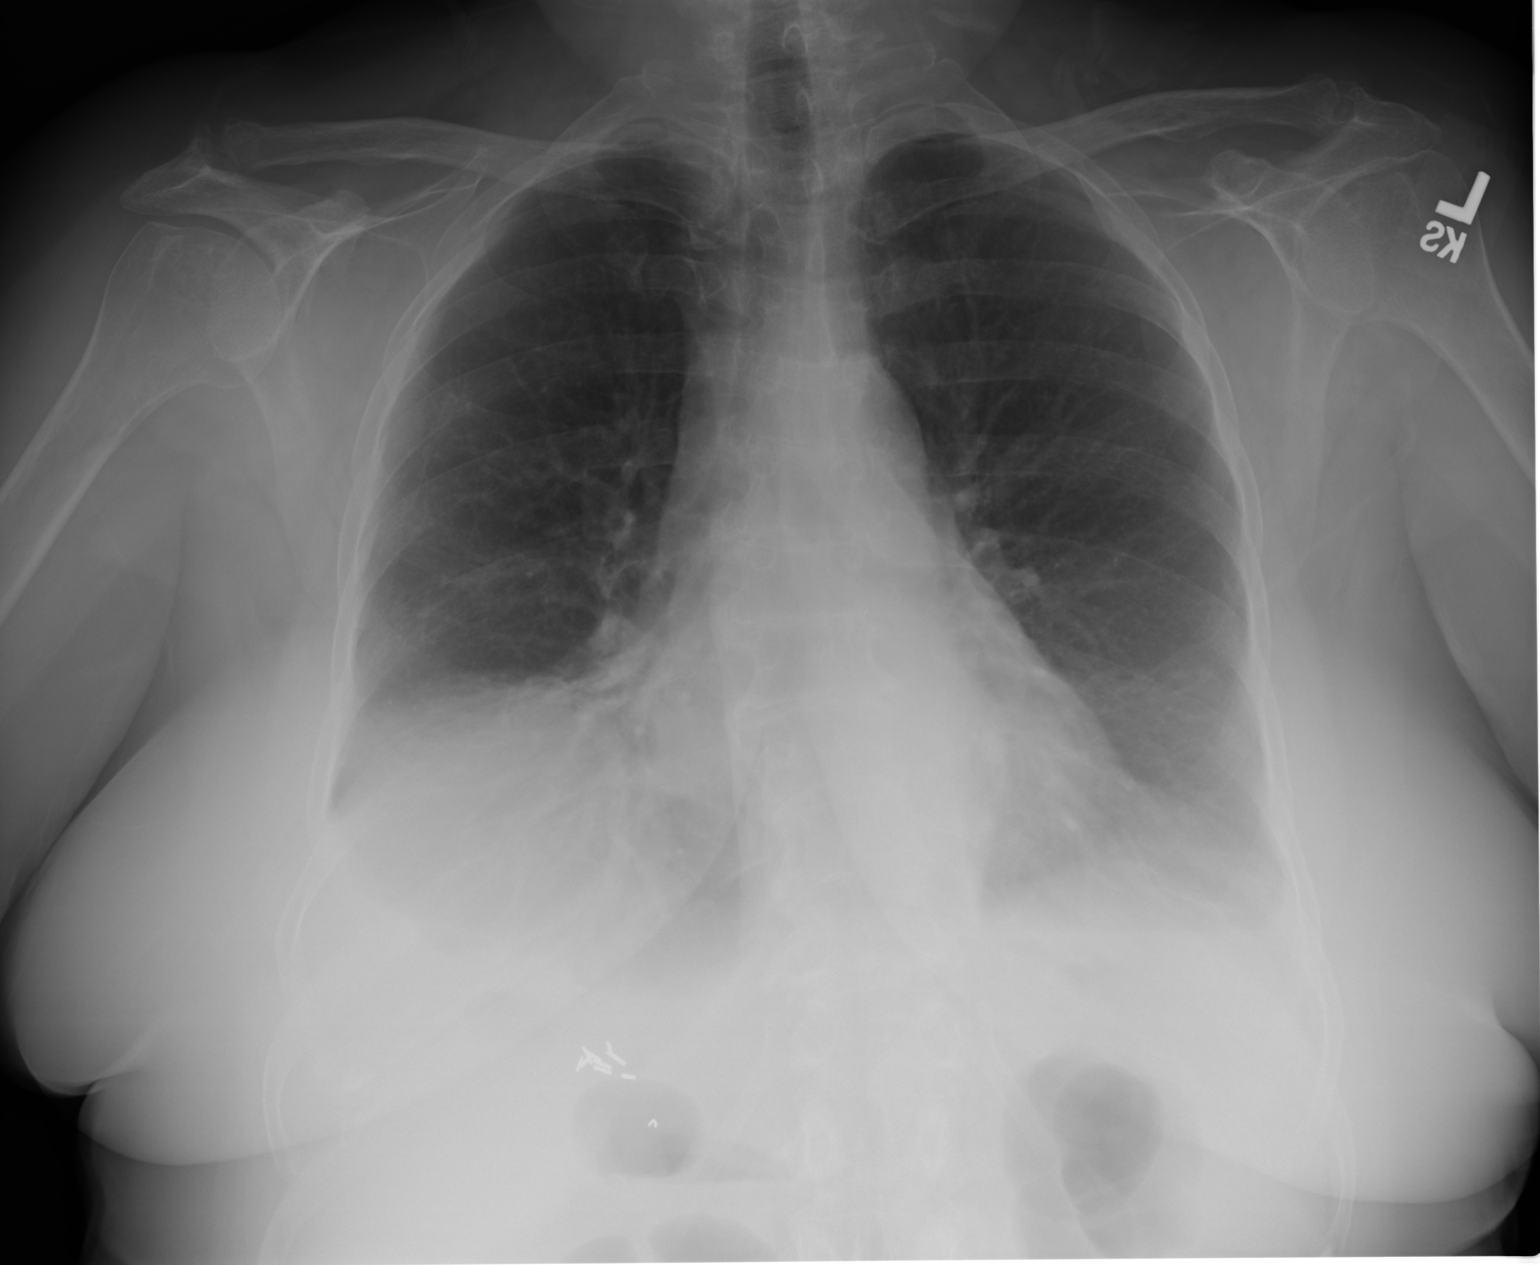
[im 2/2]
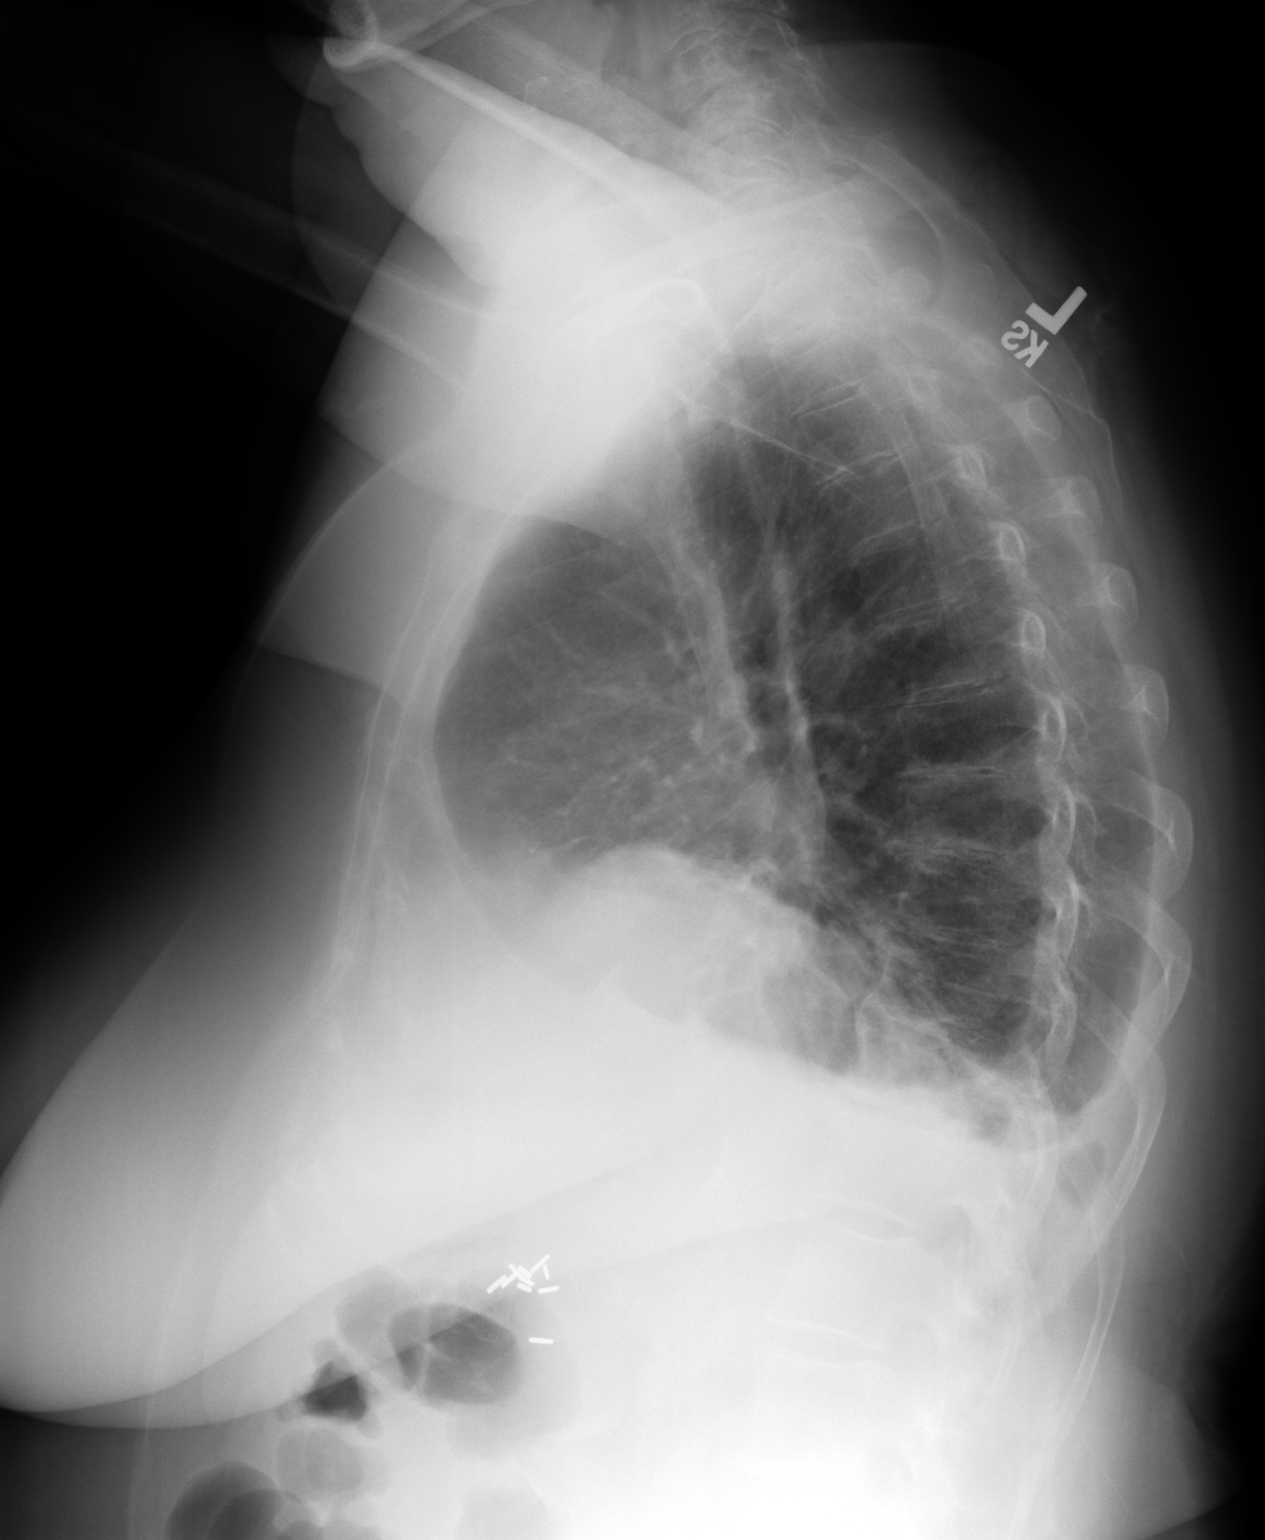

[2 of 2 positions shown; findings below may reference images not displayed]

FINDINGS: Heart and mediastinum are stable. There is mild elevation right diaphragm.
Mild right basilar opacities. There is mild blunting of the lateral
costophrenic angles.
IMPRESSION: 1. Mild right basilar opacities likely atelectasis. Early infection or
aspiration not excluded.
2. Mild pleural thickening at the costophrenic angles versus small pleural
effusions.

## 2011-11-06 IMAGING — CT CT CHEST W/ CM
1 series · 14 of 33 positions shown, 18 images · non-contrast
Comparison: none

REASON FOR EXAM: sob, tachy cardic, sl fever, sl lo sat hi ddimer
COMMENTS:

[Series 4: soft tissue · axial · 0.66mm/px · z∈[-304,-52]mm · 14 of 100 slices shown, 18 images]
[im 8/100  mediastinal]
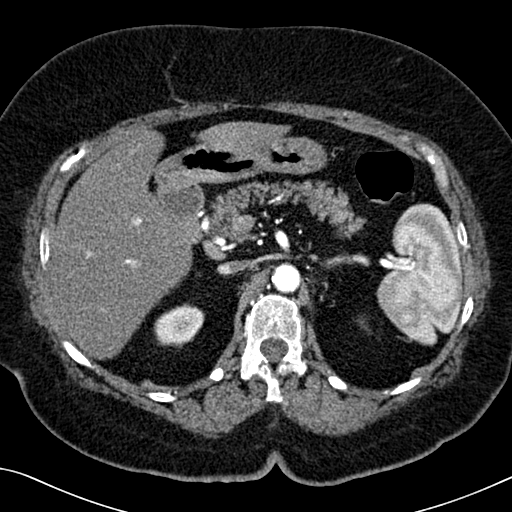
[im 8/100  lung]
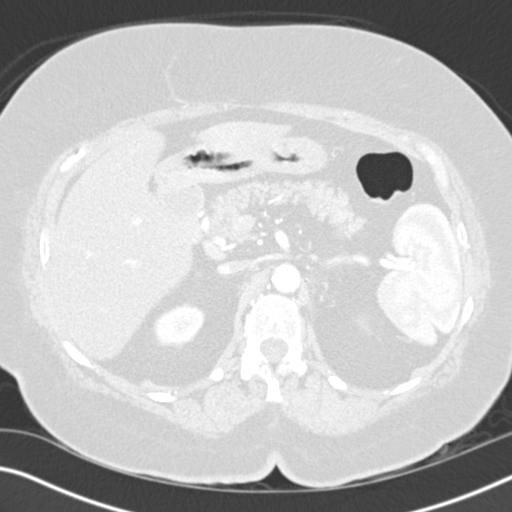
[im 15/100  lung]
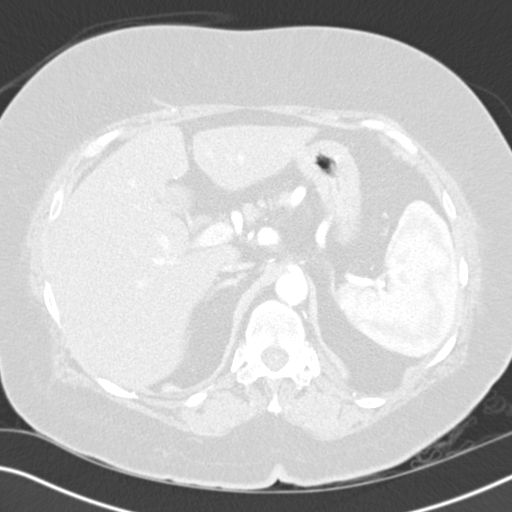
[im 20/100  lung]
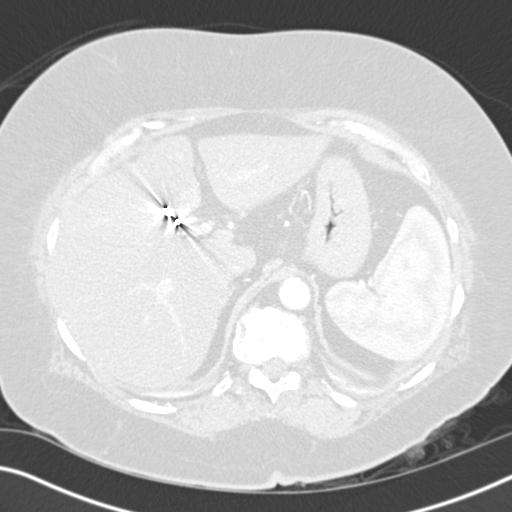
[im 26/100  lung]
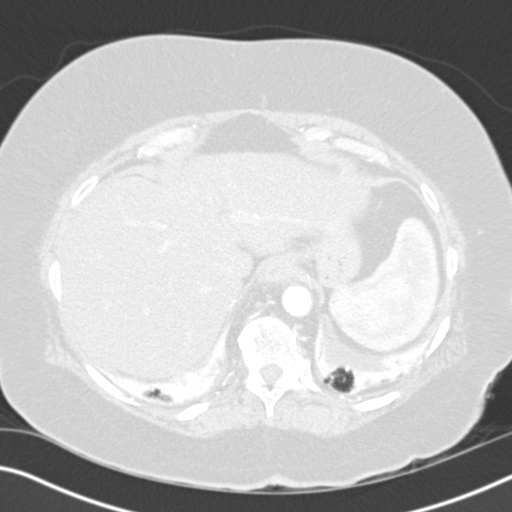
[im 34/100  mediastinal]
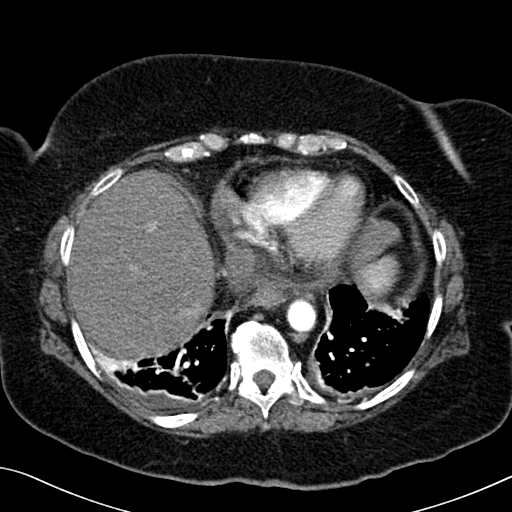
[im 34/100  lung]
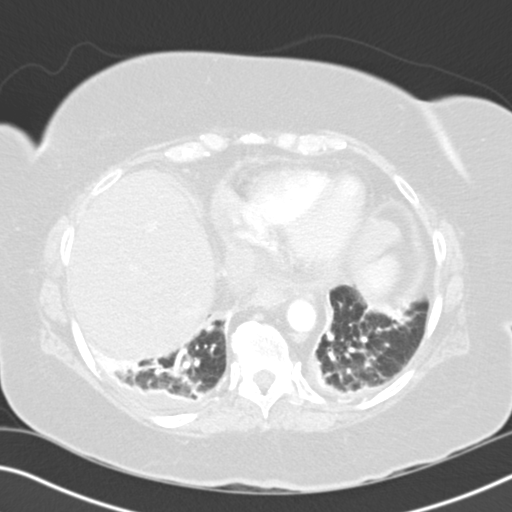
[im 41/100  lung]
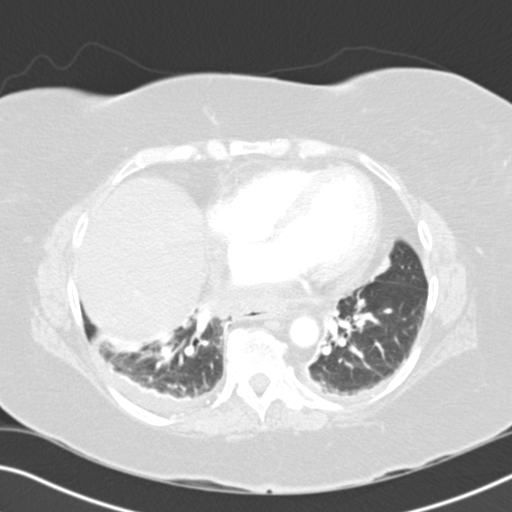
[im 48/100  lung]
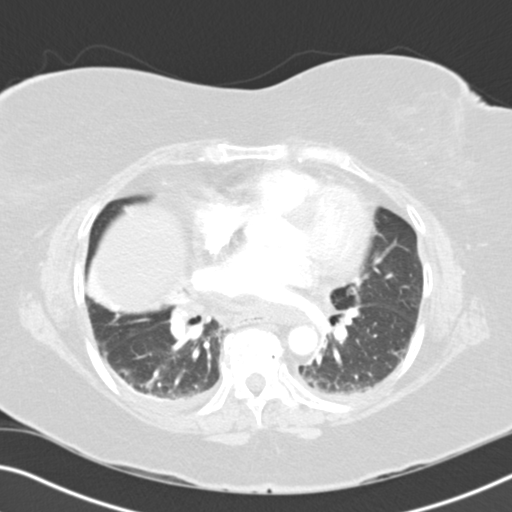
[im 53/100  lung]
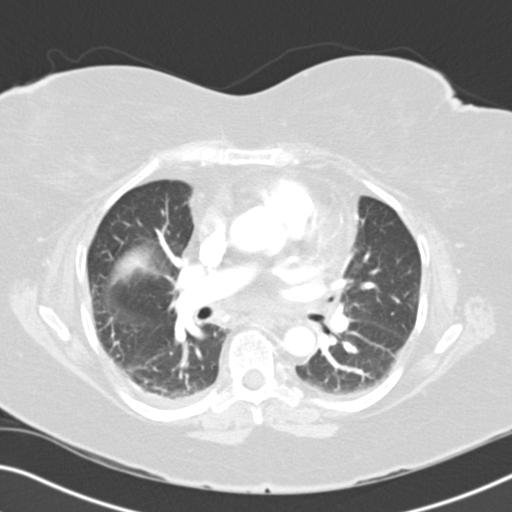
[im 59/100  mediastinal]
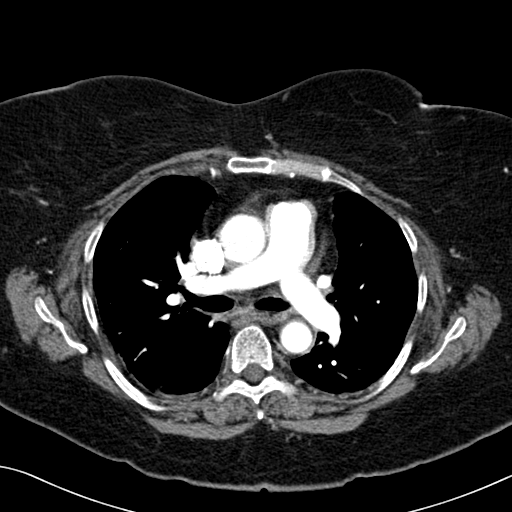
[im 59/100  lung]
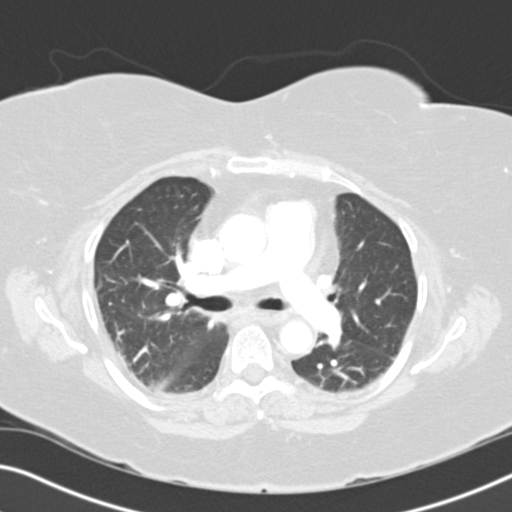
[im 67/100  lung]
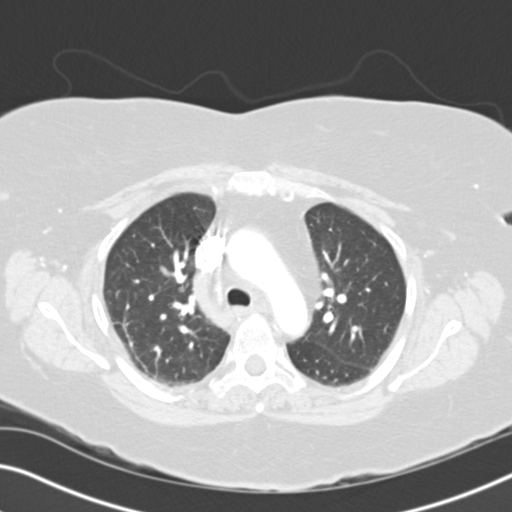
[im 74/100  lung]
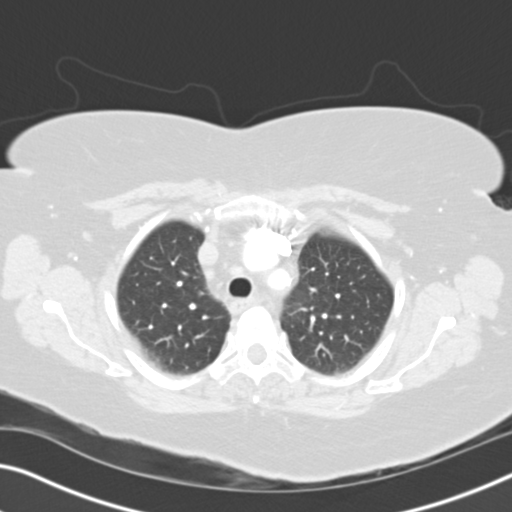
[im 80/100  lung]
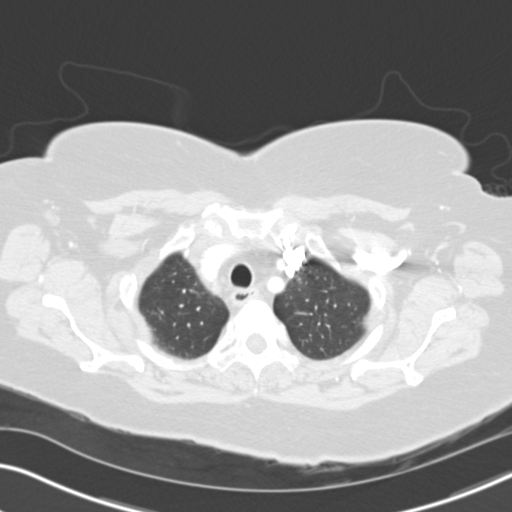
[im 85/100  mediastinal]
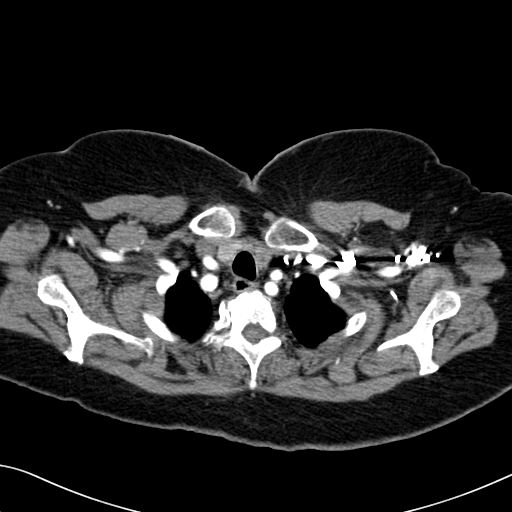
[im 85/100  lung]
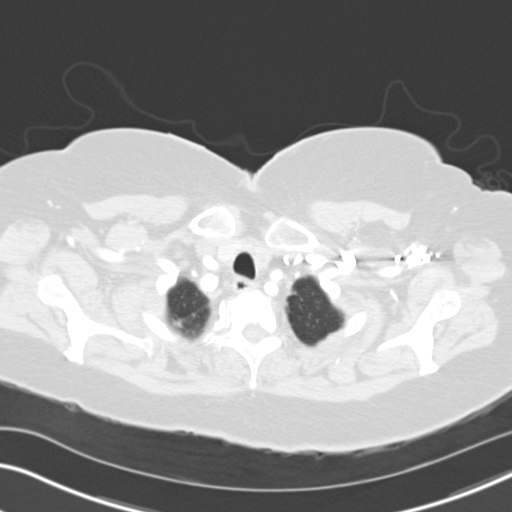
[im 92/100  lung]
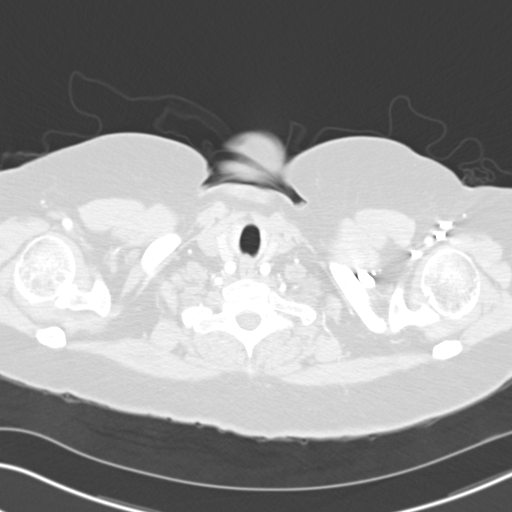

[14 of 33 positions shown; findings below may reference images not displayed]

PROCEDURE:     CT  - CT CHEST (FOR PE) W  - February 23, 2010  [DATE]

RESULT:     Axial CT scanning was performed through the chest at 3 mm
intervals and slice thicknesses following intravenous administration of 100
cc of [id number]. Review of multiplanar reconstructed images was performed
separately on the VIA monitor.

Contrast within the pulmonary arterial tree is within the limits of normal.
I do not see evidence of an acute pulmonary embolism. The caliber of the
thoracic aorta is normal. There is no evidence of a false lumen. The cardiac
chambers are mildly enlarged. There is a tiny pericardial effusion. There is
a tiny left pleural effusion and small right pleural effusion. There is
basilar parenchymal consolidation within both lungs consistent with
pneumonia. I do not see bulky mediastinal or hilar lymph nodes but
borderline enlarged right infrahilar nodes are present. There is no axillary
or retrosternal or AP window lymphadenopathy. The subcarinal region exhibits
no bulky nodes either. There is degenerative disc change of the lumbar spine.

At lung window settings the upper lobes are relatively clear. There is
minimal compressive atelectasis posteriorly in the right upper lobe. The
right middle lobe appears clear. There is the aforementioned consolidation
of portions of the posterior aspects of both lower lobes consistent with
pneumonia. I do not see suspicious pulmonary parenchymal masses.

Within the upper abdomen there is decreased density in the liver consistent
with fatty infiltration. The observed portions of the spleen are normal.
There are no adrenal masses. The gallbladder is surgically absent. There is
a moderate amount of fluid in the first portion of the duodenum. The
thoracic vertebral bodies are preserved in height. There is degenerative
disc change at the cervicothoracic junction in the lower thoracic and upper
lumbar spine.
IMPRESSION: 1. I do not see evidence of an acute pulmonary embolism nor acute thoracic
aortic pathology.
2. There is bibasilar pulmonary parenchymal consolidation consistent with
pneumonia with small amounts of pleural fluid. There are are borderline
enlarged right infrahilar lymph nodes.
3. There is borderline enlargement of the cardiac chambers and a tiny
pericardial effusion.
4. There are fatty infiltrative changes of the liver.

A preliminary report was sent to the [HOSPITAL] the conclusion
of the study.

## 2011-11-11 ENCOUNTER — Ambulatory Visit: Payer: Self-pay | Admitting: Internal Medicine

## 2011-11-19 ENCOUNTER — Ambulatory Visit: Payer: Self-pay | Admitting: Internal Medicine

## 2011-12-08 IMAGING — US ULTRASOUND RIGHT BREAST
1 series · 16 of 16 positions shown · non-contrast
Comparison: none

REASON FOR EXAM: SPICULATED DENSITY
COMMENTS:

PROCEDURE:     US  - US BREAST RIGHT  - March 27, 2010  [DATE]
RESULT:

[Series 1: ultrasound right breast · 16 of 16 slices shown]
[im 1/16]
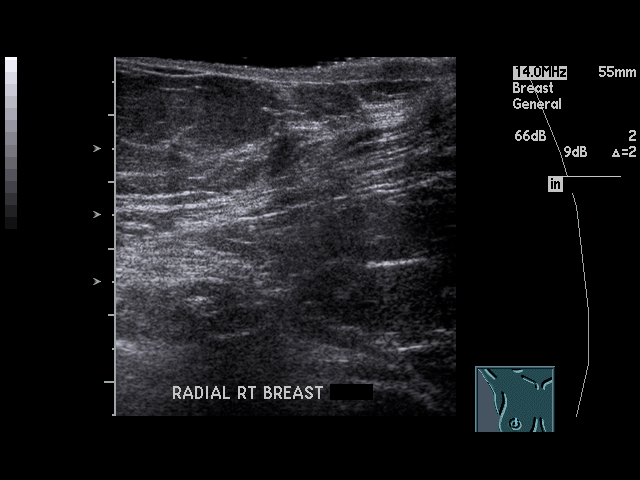
[im 2/16]
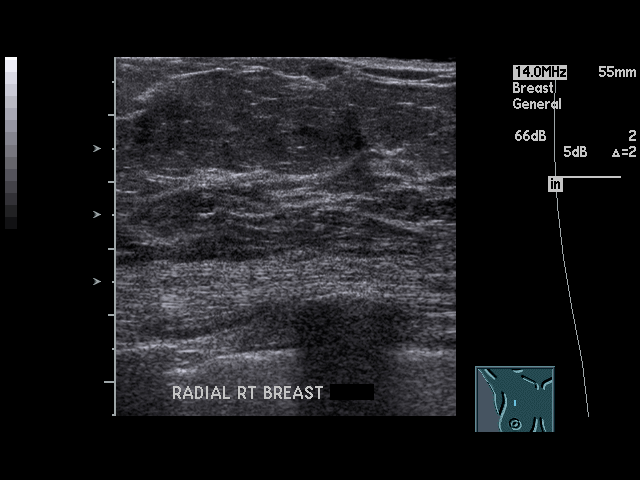
[im 3/16]
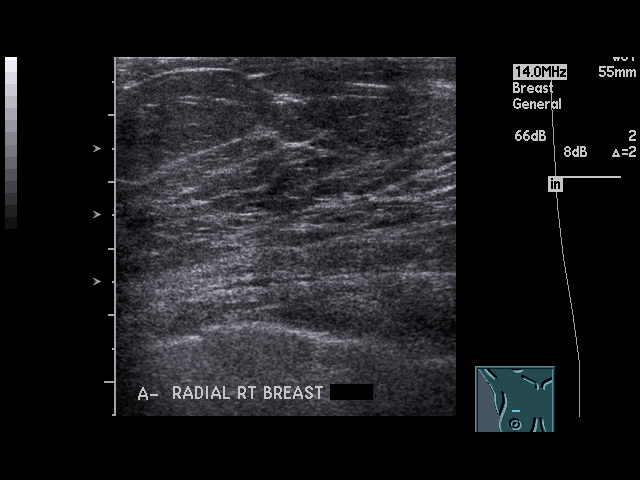
[im 4/16]
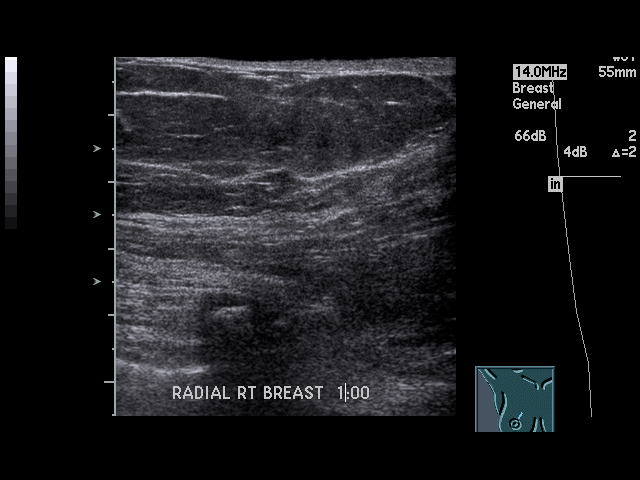
[im 5/16]
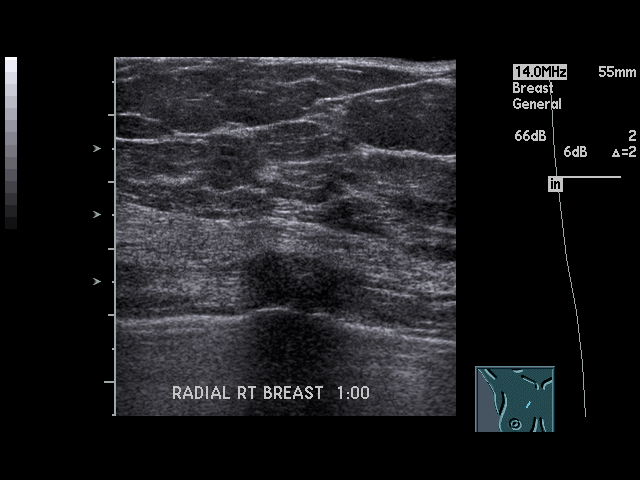
[im 6/16]
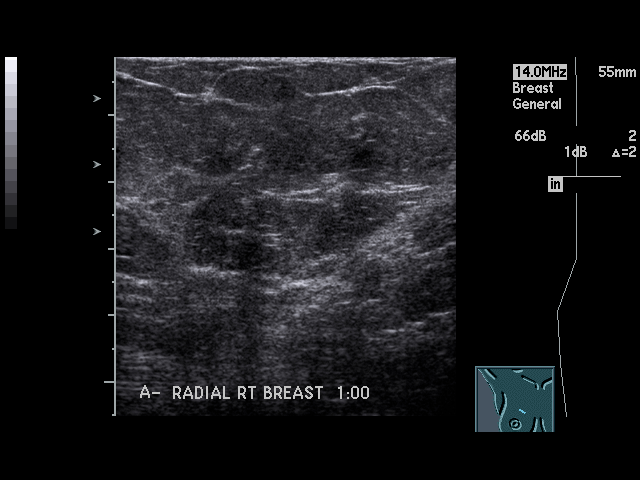
[im 7/16]
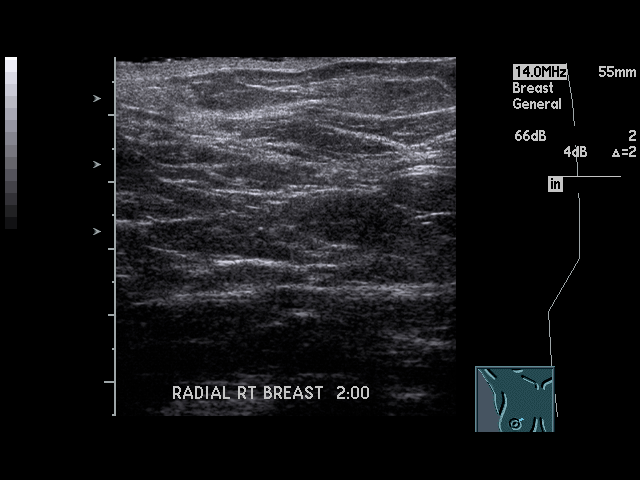
[im 8/16]
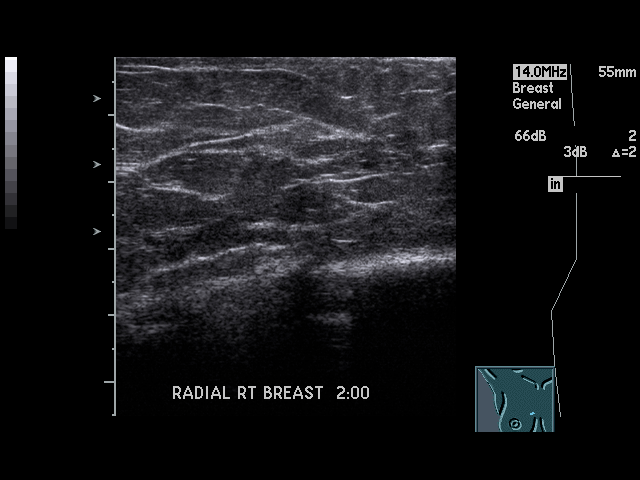
[im 9/16]
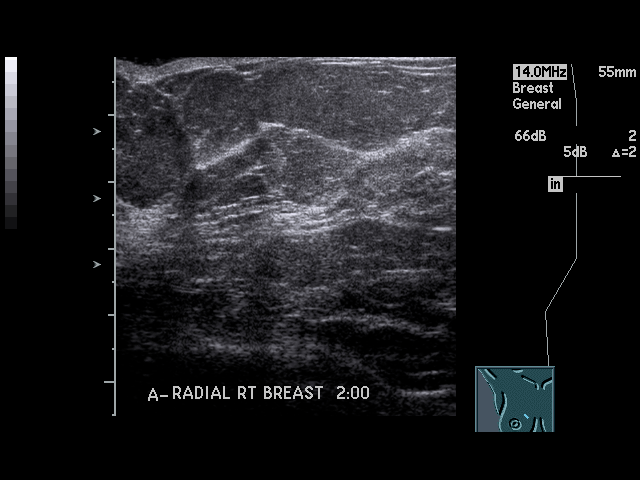
[im 10/16]
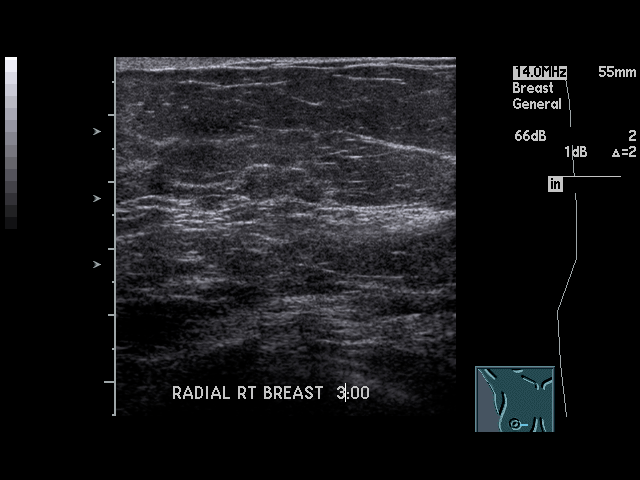
[im 11/16]
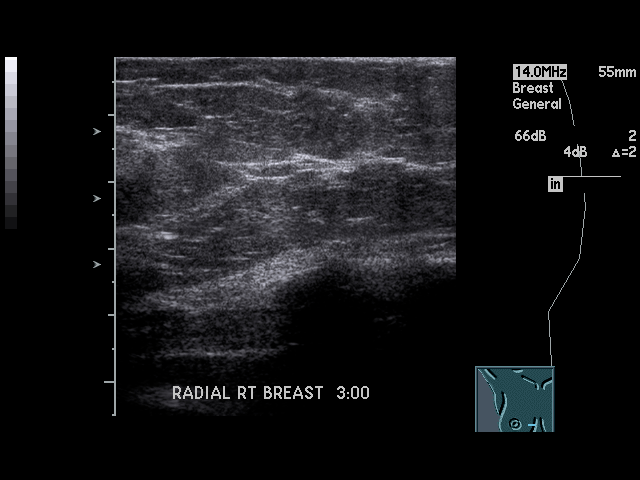
[im 12/16]
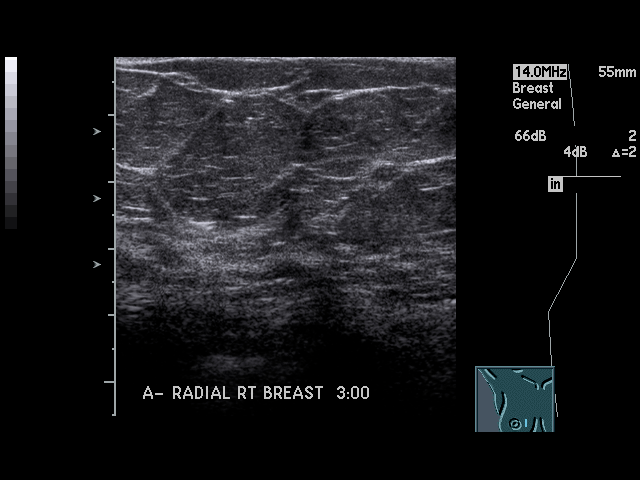
[im 13/16]
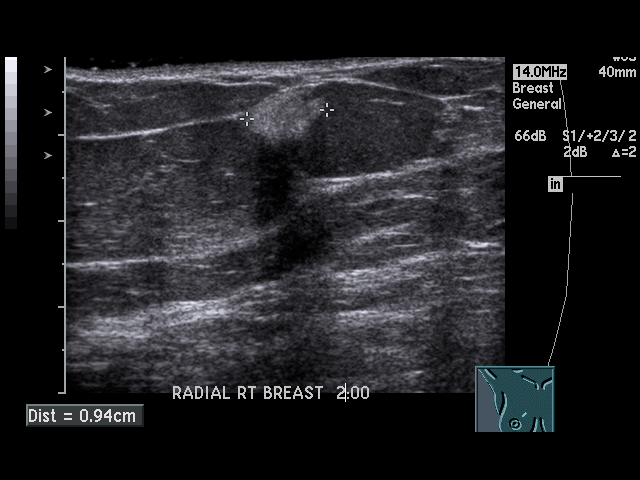
[im 14/16]
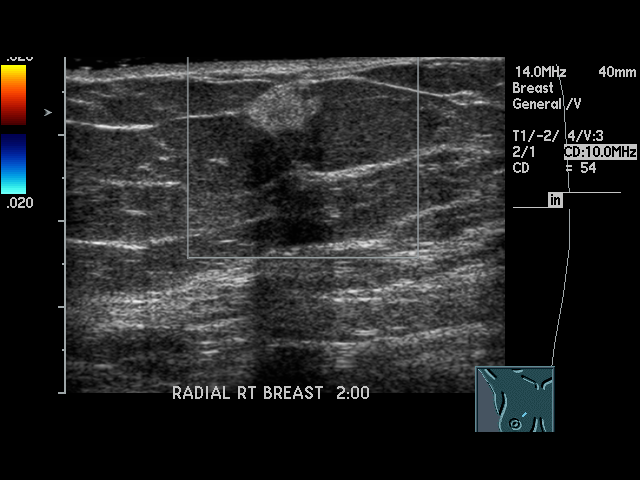
[im 15/16]
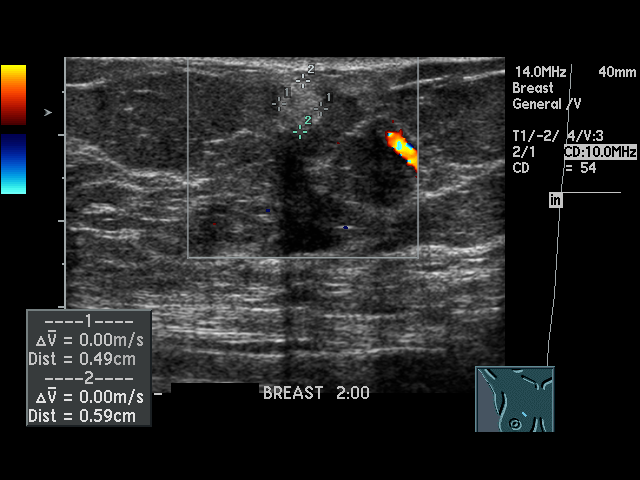
[im 16/16]
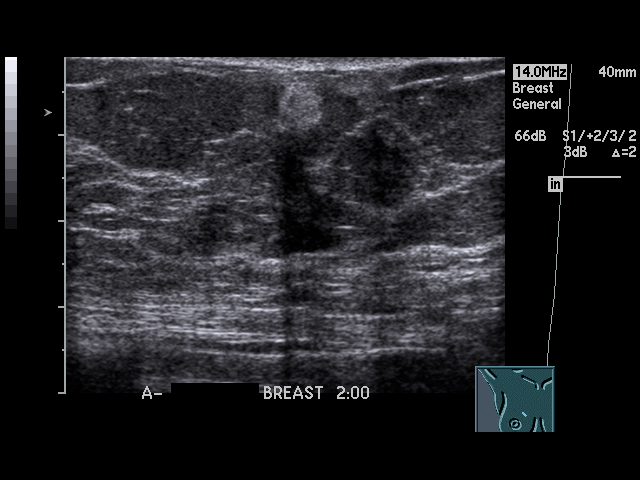

[16 of 16 positions shown; findings below may reference images not displayed]

FINDINGS: True lateral view and spot compression views of the upper inner
right breast were performed demonstrating a spiculated asymmetry in the
upper inner right breast at approximately the 2 o'clock position. There is
no architectural distortion. There is no other dominant mass. There are no
clusters of suspicious microcalcifications.

Further evaluation was performed with real-time sonography of the upper
inner breast with a high frequency linear transducer. There is a 9 x 5 x 6
mm, hyperechoic mass in the upper inner right breast approximately at the 2
o'clock position. There is no internal Doppler flow. There is posterior
acoustic shadowing.
IMPRESSION: Spiculated asymmetry in the upper inner right breast.
Correlate sonographically with a hyperechoic mass with posterior acoustic
shadowing which is new when compared to the prior examination. In the
absence of known trauma, the appearance is concerning for a mass. Recommend
tissue diagnosis.

BI-RADS: Category 4A: Finding needing intervention with a low suspicion for
malignancy.

## 2011-12-28 IMAGING — US US  BREAST BX W/ LOC DEV 1ST LESION IMG BX SPEC US GUIDE*R*
1 series · 6 of 6 positions shown · non-contrast
Comparison: none

REASON FOR EXAM: Rt  Breast Mass
COMMENTS:

PROCEDURE:     US  - US GUIDED BIOPSY BREAST RIGHT  - April 16, 2010  [DATE]
RESULT:
HISTORY: Right breast mass.

[Series 1: us breast bx w/ loc dev 1st lesion img bx spec us  · 6 of 6 slices shown]
[im 1/6]
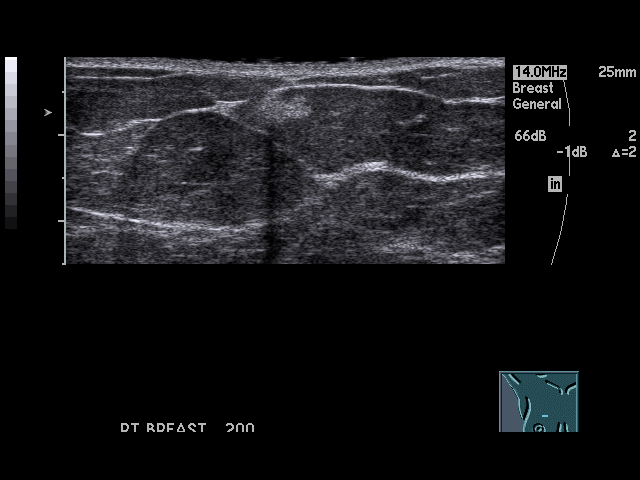
[im 2/6]
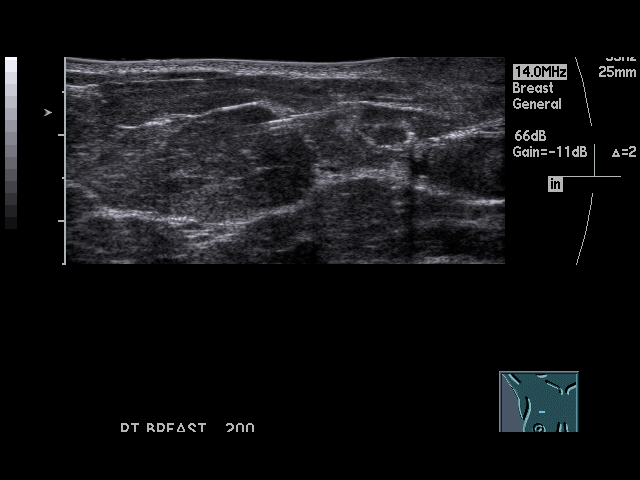
[im 3/6]
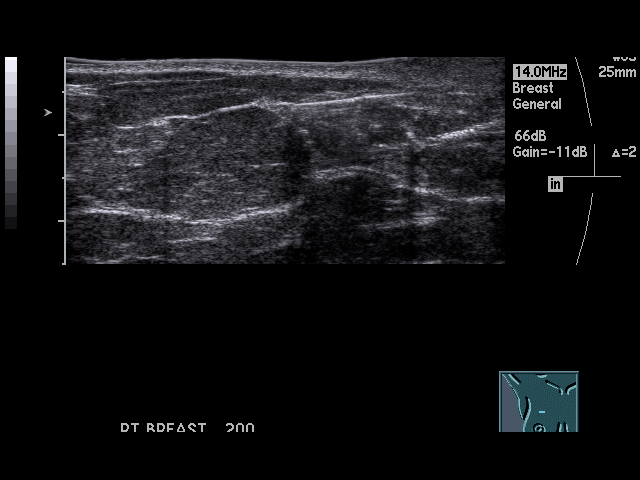
[im 4/6]
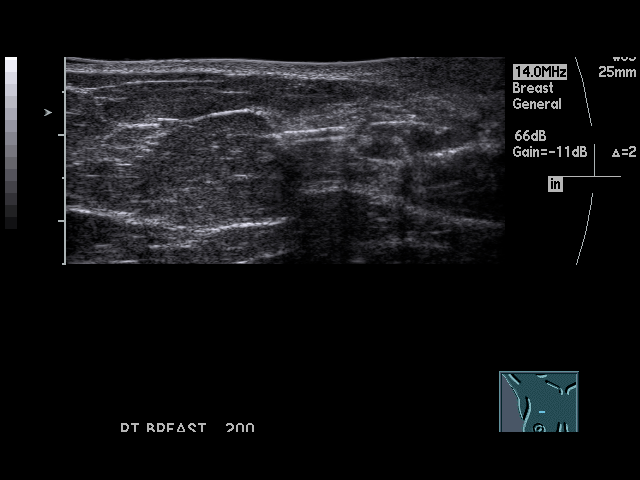
[im 5/6]
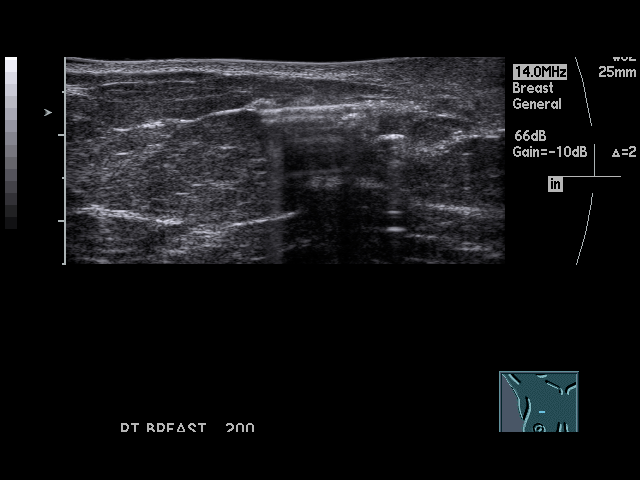
[im 6/6]
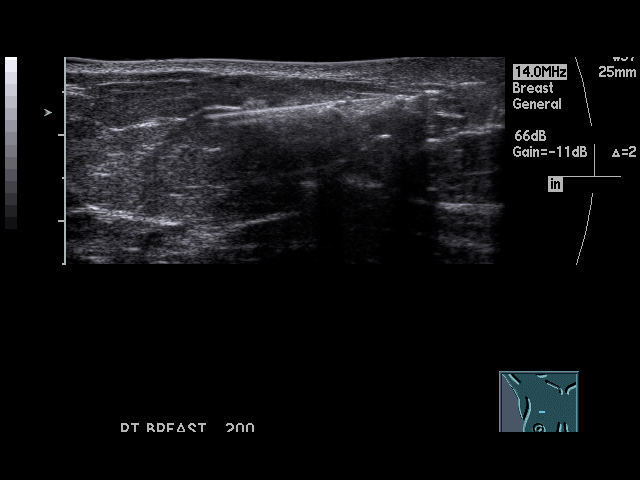

[6 of 6 positions shown; findings below may reference images not displayed]

PROCEDURE AND FINDINGS:   After discussing the risks and benefits of this
procedure with the patient, informed consent was obtained. Previously
identified echogenic right breast mass was successfully localized with
ultrasound. Local anesthesia with 1% lidocaine administered. An 18-gauge
Achieve core biopsy needle system was advanced into the lesion and multiple
18-gauge core samples were obtained. There were no complications.
IMPRESSION: Successful ultrasound-directed right breast mass biopsy.

## 2012-01-16 IMAGING — US US NEEDLE LOCALIZATION*R*
1 series · 2 of 2 positions shown · non-contrast
Comparison: none

REASON FOR EXAM: rt breast lumpectomy with US needle loc at 9am  surgery
at 5528am [DATE]
COMMENTS:

[Series 1: us needle localization*right* · 2 of 2 slices shown]
[im 1/2]
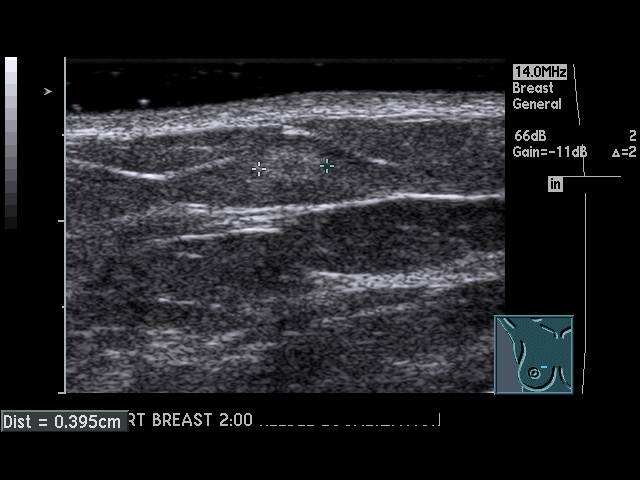
[im 2/2]
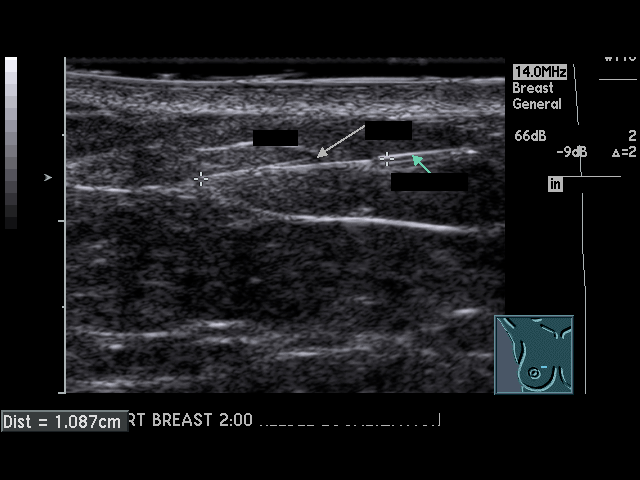

[2 of 2 positions shown; findings below may reference images not displayed]

PROCEDURE:     US  - US GUIDED NEEDLE LOCAL R BREAST  - May 05, 2010  [DATE]

RESULT:     Comparison: Ultrasound from 03/27/2010 and 04/16/2010. Mammograms
03/27/2010.

Technique and Findings:
Limited ultrasound was performed of the right breast at the [DATE] position.
At this location, there was a hyperechoic mass which measured 4 mm in
diameter and correlating with that seen on the prior ultrasound. The margins
of this mass were somewhat less defined than the original ultrasound
03/27/2010. This may related to the interval biopsy.

After informed consent was obtained from the patient, all questions were
answered, and a formal timeout procedure was performed, the patient was
prepped and draped in the usual sterile fashion. Anesthesia was provided
with approximately 2 mL of 1% lidocaine. A 7 cm Scepan-Scepo it was advanced into
the mass under direct ultrasound guidance. The wire was then placed through
the needle and the needle was removed under ultrasound visualization. The
mass was centered just beyond the thickened portion of wire, and just
proximal to the tip of the wire. The [place] of the wire appeared to be within
the mass. The images were labeled and the patient was sent to the operating
suite. The patient tolerated the procedure well, without immediate
competition.
IMPRESSION: 1. Successful needle localization of the right breast. The mass was centered
just distal to the thickening of the wire.
2. The mass was somewhat less well-defined than the original ultrasound, but
did appear to be a discrete mass on today's study. Consider short interval
mammograms after the excision to correlate with the mammographic abnormality.

The above was discussed with Dr. Maurice Karp at approximately 4504 hours
05/05/2010.

## 2012-01-26 ENCOUNTER — Other Ambulatory Visit: Payer: Self-pay | Admitting: Orthopedic Surgery

## 2012-01-27 ENCOUNTER — Encounter (HOSPITAL_BASED_OUTPATIENT_CLINIC_OR_DEPARTMENT_OTHER): Payer: Self-pay | Admitting: *Deleted

## 2012-01-27 NOTE — Progress Notes (Signed)
To bring all meds, overnight bag- Will need new ekg-cardiology did not have recent-last 2011-called them x2 Called for cxr from rheumatology Stayed her 2011 with rt hand reconstruction

## 2012-01-27 NOTE — Discharge Instructions (Signed)

## 2012-01-28 ENCOUNTER — Other Ambulatory Visit: Payer: Self-pay

## 2012-01-28 ENCOUNTER — Encounter (HOSPITAL_BASED_OUTPATIENT_CLINIC_OR_DEPARTMENT_OTHER): Payer: Self-pay | Admitting: *Deleted

## 2012-01-28 ENCOUNTER — Ambulatory Visit (HOSPITAL_BASED_OUTPATIENT_CLINIC_OR_DEPARTMENT_OTHER)
Admission: RE | Admit: 2012-01-28 | Discharge: 2012-01-28 | Disposition: A | Discharge status: Home / Self Care | Payer: PRIVATE HEALTH INSURANCE | Source: Ambulatory Visit | Attending: Orthopedic Surgery | Admitting: Orthopedic Surgery

## 2012-01-28 ENCOUNTER — Encounter (HOSPITAL_BASED_OUTPATIENT_CLINIC_OR_DEPARTMENT_OTHER)
Admission: RE | Disposition: A | Discharge status: Home / Self Care | Payer: Self-pay | Source: Ambulatory Visit | Attending: Orthopedic Surgery

## 2012-01-28 ENCOUNTER — Encounter (HOSPITAL_BASED_OUTPATIENT_CLINIC_OR_DEPARTMENT_OTHER): Payer: Self-pay | Admitting: Anesthesiology

## 2012-01-28 DIAGNOSIS — Z01810 Encounter for preprocedural cardiovascular examination: Secondary | ICD-10-CM | POA: Insufficient documentation

## 2012-01-28 DIAGNOSIS — M069 Rheumatoid arthritis, unspecified: Secondary | ICD-10-CM | POA: Insufficient documentation

## 2012-01-28 DIAGNOSIS — Z538 Procedure and treatment not carried out for other reasons: Secondary | ICD-10-CM | POA: Insufficient documentation

## 2012-01-28 DIAGNOSIS — M249 Joint derangement, unspecified: Secondary | ICD-10-CM | POA: Insufficient documentation

## 2012-01-28 HISTORY — DX: Major depressive disorder, single episode, unspecified: F32.9

## 2012-01-28 HISTORY — DX: Essential (primary) hypertension: I10

## 2012-01-28 HISTORY — DX: Anxiety disorder, unspecified: F41.9

## 2012-01-28 HISTORY — DX: Other specified disorders of bone density and structure, unspecified site: M85.80

## 2012-01-28 HISTORY — DX: Gastro-esophageal reflux disease without esophagitis: K21.9

## 2012-01-28 HISTORY — DX: Depression, unspecified: F32.A

## 2012-01-28 HISTORY — DX: Chronic obstructive pulmonary disease, unspecified: J44.9

## 2012-01-28 HISTORY — DX: Unspecified osteoarthritis, unspecified site: M19.90

## 2012-01-28 HISTORY — DX: Reserved for concepts with insufficient information to code with codable children: IMO0002

## 2012-01-28 LAB — POCT I-STAT, CHEM 8
Calcium, Ion: 1.03 mmol/L — ABNORMAL LOW (ref 1.12–1.32)
HCT: 40 % (ref 36.0–46.0)
Hemoglobin: 13.6 g/dL (ref 12.0–15.0)
Sodium: 141 mEq/L (ref 135–145)
TCO2: 23 mmol/L (ref 0–100)

## 2012-01-28 SURGERY — TRANSFER, TENDON
Anesthesia: General | Laterality: Left

## 2012-01-28 MED ORDER — LACTATED RINGERS IV SOLN: INTRAVENOUS | Status: DC

## 2012-01-28 MED ORDER — CHLORHEXIDINE GLUCONATE 4 % EX LIQD: 60.0000 mL | Freq: Once | CUTANEOUS | Status: DC

## 2012-01-28 SURGICAL SUPPLY — 75 items
BANDAGE ADHESIVE 1X3 (GAUZE/BANDAGES/DRESSINGS) IMPLANT
BANDAGE ELASTIC 3 VELCRO ST LF (GAUZE/BANDAGES/DRESSINGS) IMPLANT
BANDAGE GAUZE ELAST BULKY 4 IN (GAUZE/BANDAGES/DRESSINGS) IMPLANT
BLADE MINI RND TIP GREEN BEAV (BLADE) IMPLANT
BLADE SURG 15 STRL LF DISP TIS (BLADE) IMPLANT
BLADE SURG 15 STRL SS (BLADE)
BNDG COHESIVE 1X5 TAN STRL LF (GAUZE/BANDAGES/DRESSINGS) IMPLANT
BNDG ELASTIC 2 VLCR STRL LF (GAUZE/BANDAGES/DRESSINGS) IMPLANT
BNDG ESMARK 4X9 LF (GAUZE/BANDAGES/DRESSINGS) IMPLANT
BRUSH SCRUB EZ PLAIN DRY (MISCELLANEOUS) IMPLANT
CATH ROBINSON RED A/P 10FR (CATHETERS) IMPLANT
CATH ROBINSON RED A/P 12FR (CATHETERS) IMPLANT
CLOTH BEACON ORANGE TIMEOUT ST (SAFETY) IMPLANT
CORDS BIPOLAR (ELECTRODE) IMPLANT
COVER MAYO STAND STRL (DRAPES) IMPLANT
COVER TABLE BACK 60X90 (DRAPES) IMPLANT
CUFF TOURNIQUET SINGLE 18IN (TOURNIQUET CUFF) IMPLANT
DECANTER SPIKE VIAL GLASS SM (MISCELLANEOUS) IMPLANT
DRAIN PENROSE 1/4X12 LTX STRL (WOUND CARE) IMPLANT
DRAPE EXTREMITY T 121X128X90 (DRAPE) IMPLANT
DRAPE SURG 17X23 STRL (DRAPES) IMPLANT
GAUZE SPONGE 4X4 16PLY XRAY LF (GAUZE/BANDAGES/DRESSINGS) IMPLANT
GAUZE XEROFORM 1X8 LF (GAUZE/BANDAGES/DRESSINGS) IMPLANT
GLOVE BIOGEL M STRL SZ7.5 (GLOVE) IMPLANT
GLOVE ORTHO TXT STRL SZ7.5 (GLOVE) IMPLANT
GOWN PREVENTION PLUS XLARGE (GOWN DISPOSABLE) IMPLANT
GOWN PREVENTION PLUS XXLARGE (GOWN DISPOSABLE) IMPLANT
NEEDLE 27GAX1X1/2 (NEEDLE) IMPLANT
NEEDLE ADDISON D1/2 CIR (NEEDLE) IMPLANT
NEEDLE HYPO 25X1 1.5 SAFETY (NEEDLE) IMPLANT
NEEDLE KEITH (NEEDLE) IMPLANT
NEEDLE KEITH SZ10 STRAIGHT (NEEDLE) IMPLANT
NS IRRIG 1000ML POUR BTL (IV SOLUTION) IMPLANT
PACK BASIN DAY SURGERY FS (CUSTOM PROCEDURE TRAY) IMPLANT
PAD CAST 3X4 CTTN HI CHSV (CAST SUPPLIES) IMPLANT
PADDING CAST ABS 3INX4YD NS (CAST SUPPLIES)
PADDING CAST ABS 4INX4YD NS (CAST SUPPLIES)
PADDING CAST ABS COTTON 3X4 (CAST SUPPLIES) IMPLANT
PADDING CAST ABS COTTON 4X4 ST (CAST SUPPLIES) IMPLANT
PADDING CAST COTTON 3X4 STRL (CAST SUPPLIES)
PASSER SUT SWANSON 36MM LOOP (INSTRUMENTS) IMPLANT
SLEEVE SCD COMPRESS KNEE MED (MISCELLANEOUS) IMPLANT
SPLINT PLASTER CAST XFAST 3X15 (CAST SUPPLIES) IMPLANT
SPLINT PLASTER XTRA FASTSET 3X (CAST SUPPLIES)
SPONGE GAUZE 4X4 12PLY (GAUZE/BANDAGES/DRESSINGS) IMPLANT
STOCKINETTE 4X48 STRL (DRAPES) IMPLANT
STOCKINETTE 6  STRL (DRAPES)
STOCKINETTE 6 STRL (DRAPES) IMPLANT
STRIP CLOSURE SKIN 1/2X4 (GAUZE/BANDAGES/DRESSINGS) IMPLANT
SUT ETHIBOND 3-0 V-5 (SUTURE) IMPLANT
SUT ETHILON 5 0 P 3 18 (SUTURE)
SUT FIBERWIRE 3-0 18 TAPR NDL (SUTURE)
SUT FIBERWIRE 4-0 18 TAPR NDL (SUTURE)
SUT MERSILENE 4 0 P 3 (SUTURE) IMPLANT
SUT MERSILENE 6 0 P 1 (SUTURE) IMPLANT
SUT MERSILENE 6 0 S14 DA (SUTURE) IMPLANT
SUT NYLON ETHILON 5-0 P-3 1X18 (SUTURE) IMPLANT
SUT POLY BUTTON 15MM (SUTURE) IMPLANT
SUT PROLENE 2 0 SH DA (SUTURE) IMPLANT
SUT PROLENE 3 0 PS 2 (SUTURE) IMPLANT
SUT PROLENE 4 0 PS 2 18 (SUTURE) IMPLANT
SUT SILK 4 0 PS 2 (SUTURE) IMPLANT
SUT VIC AB 4-0 SH 27 (SUTURE)
SUT VIC AB 4-0 SH 27XANBCTRL (SUTURE) IMPLANT
SUTURE FIBERWR 3-0 18 TAPR NDL (SUTURE) IMPLANT
SUTURE FIBERWR 4-0 18 TAPR NDL (SUTURE) IMPLANT
SYR 3ML 23GX1 SAFETY (SYRINGE) IMPLANT
SYR 5ML LL (SYRINGE) IMPLANT
SYR BULB 3OZ (MISCELLANEOUS) IMPLANT
SYR CONTROL 10ML LL (SYRINGE) IMPLANT
TOWEL OR 17X24 6PK STRL BLUE (TOWEL DISPOSABLE) IMPLANT
TRAY DSU PREP LF (CUSTOM PROCEDURE TRAY) IMPLANT
TUBE FEEDING 5FR 15 INCH (TUBING) IMPLANT
UNDERPAD 30X30 INCONTINENT (UNDERPADS AND DIAPERS) IMPLANT
WATER STERILE IRR 1000ML POUR (IV SOLUTION) IMPLANT

## 2012-01-28 NOTE — H&P (Signed)
  Meghan [name] was scheduled for a rheumatoid reconstruction at [facility] today.  Due to a human error in the chain of order transcription the necessary silastic implant set was not ordered from the [facility] medical office in [place].  We are unable to accomplish the surgical procedure today due to the circumstances of not having the necessary implants available.  We did not start an IV.  I will reschedule Meghan [name] in July for this surgery.  I offered an apology on behalf of [place] for the heath system's human error in a conversation with Meghan [name] in the holding area prior to her departure.

## 2012-02-18 NOTE — Progress Notes (Signed)
Pt was r/s due to not having equipment. Will need istat Have all notes To bring all meds-overnight bag.

## 2012-02-19 ENCOUNTER — Other Ambulatory Visit: Payer: Self-pay | Admitting: Orthopedic Surgery

## 2012-02-23 ENCOUNTER — Ambulatory Visit (HOSPITAL_BASED_OUTPATIENT_CLINIC_OR_DEPARTMENT_OTHER)
Admission: RE | Admit: 2012-02-23 | Discharge: 2012-02-24 | Disposition: A | Discharge status: Home / Self Care | Payer: PRIVATE HEALTH INSURANCE | Source: Ambulatory Visit | Attending: Orthopedic Surgery | Admitting: Orthopedic Surgery

## 2012-02-23 ENCOUNTER — Encounter (HOSPITAL_BASED_OUTPATIENT_CLINIC_OR_DEPARTMENT_OTHER): Payer: Self-pay | Admitting: *Deleted

## 2012-02-23 ENCOUNTER — Encounter (HOSPITAL_BASED_OUTPATIENT_CLINIC_OR_DEPARTMENT_OTHER): Payer: Self-pay | Admitting: Anesthesiology

## 2012-02-23 ENCOUNTER — Ambulatory Visit (HOSPITAL_BASED_OUTPATIENT_CLINIC_OR_DEPARTMENT_OTHER): Payer: PRIVATE HEALTH INSURANCE | Admitting: Anesthesiology

## 2012-02-23 ENCOUNTER — Encounter (HOSPITAL_BASED_OUTPATIENT_CLINIC_OR_DEPARTMENT_OTHER)
Admission: RE | Disposition: A | Discharge status: Home / Self Care | Payer: Self-pay | Source: Ambulatory Visit | Attending: Orthopedic Surgery

## 2012-02-23 DIAGNOSIS — J449 Chronic obstructive pulmonary disease, unspecified: Secondary | ICD-10-CM | POA: Insufficient documentation

## 2012-02-23 DIAGNOSIS — K219 Gastro-esophageal reflux disease without esophagitis: Secondary | ICD-10-CM | POA: Insufficient documentation

## 2012-02-23 DIAGNOSIS — J4489 Other specified chronic obstructive pulmonary disease: Secondary | ICD-10-CM | POA: Insufficient documentation

## 2012-02-23 DIAGNOSIS — M069 Rheumatoid arthritis, unspecified: Secondary | ICD-10-CM | POA: Insufficient documentation

## 2012-02-23 DIAGNOSIS — F329 Major depressive disorder, single episode, unspecified: Secondary | ICD-10-CM | POA: Insufficient documentation

## 2012-02-23 DIAGNOSIS — E119 Type 2 diabetes mellitus without complications: Secondary | ICD-10-CM | POA: Insufficient documentation

## 2012-02-23 DIAGNOSIS — I10 Essential (primary) hypertension: Secondary | ICD-10-CM | POA: Insufficient documentation

## 2012-02-23 DIAGNOSIS — M249 Joint derangement, unspecified: Secondary | ICD-10-CM | POA: Insufficient documentation

## 2012-02-23 DIAGNOSIS — F411 Generalized anxiety disorder: Secondary | ICD-10-CM | POA: Insufficient documentation

## 2012-02-23 DIAGNOSIS — M24849 Other specific joint derangements of unspecified hand, not elsewhere classified: Secondary | ICD-10-CM | POA: Insufficient documentation

## 2012-02-23 DIAGNOSIS — F3289 Other specified depressive episodes: Secondary | ICD-10-CM | POA: Insufficient documentation

## 2012-02-23 HISTORY — PX: FINGER ARTHROPLASTY: SHX5017

## 2012-02-23 LAB — POCT I-STAT, CHEM 8
BUN: 12 mg/dL (ref 6–23)
Calcium, Ion: 1.23 mmol/L (ref 1.13–1.30)
Chloride: 103 mEq/L (ref 96–112)
HCT: 41 % (ref 36.0–46.0)
Potassium: 3.3 mEq/L — ABNORMAL LOW (ref 3.5–5.1)
Sodium: 145 mEq/L (ref 135–145)

## 2012-02-23 LAB — GLUCOSE, CAPILLARY: Glucose-Capillary: 166 mg/dL — ABNORMAL HIGH (ref 70–99)

## 2012-02-23 SURGERY — ARTHROPLASTY, FINGER
Anesthesia: General | Site: Finger | Laterality: Left | Wound class: Clean

## 2012-02-23 MED ORDER — LIDOCAINE HCL (CARDIAC) 20 MG/ML IV SOLN
INTRAVENOUS | Status: DC | PRN
  Administered 2012-02-23: 50 mg via INTRAVENOUS

## 2012-02-23 MED ORDER — HYDROCORTISONE SOD SUCCINATE 100 MG IJ SOLR
100.0000 mg | Freq: Once | INTRAMUSCULAR | Status: AC
  Administered 2012-02-23: 100 g via INTRAVENOUS

## 2012-02-23 MED ORDER — CEFAZOLIN SODIUM 1-5 GM-% IV SOLN
1.0000 g | Freq: Four times a day (QID) | INTRAVENOUS | Status: AC
  Administered 2012-02-23 – 2012-02-24 (×3): 1 g via INTRAVENOUS

## 2012-02-23 MED ORDER — METOCLOPRAMIDE HCL 5 MG/ML IJ SOLN
INTRAMUSCULAR | Status: DC | PRN
  Administered 2012-02-23: 10 mg via INTRAVENOUS

## 2012-02-23 MED ORDER — ALPRAZOLAM 0.5 MG PO TABS
0.5000 mg | ORAL_TABLET | Freq: Three times a day (TID) | ORAL | Status: DC | PRN
  Administered 2012-02-23: 0.5 mg via ORAL

## 2012-02-23 MED ORDER — MIDAZOLAM HCL 2 MG/2ML IJ SOLN
1.0000 mg | INTRAMUSCULAR | Status: DC | PRN
  Administered 2012-02-23: 2 mg via INTRAVENOUS

## 2012-02-23 MED ORDER — ONDANSETRON HCL 4 MG/2ML IJ SOLN: 4.0000 mg | Freq: Four times a day (QID) | INTRAMUSCULAR | Status: DC | PRN

## 2012-02-23 MED ORDER — SODIUM CHLORIDE 0.9 % IV SOLN
INTRAVENOUS | Status: DC
  Administered 2012-02-23: 20 mL/h via INTRAVENOUS

## 2012-02-23 MED ORDER — ONDANSETRON HCL 4 MG PO TABS: 4.0000 mg | ORAL_TABLET | Freq: Four times a day (QID) | ORAL | Status: DC | PRN

## 2012-02-23 MED ORDER — METOCLOPRAMIDE HCL 5 MG/ML IJ SOLN: 10.0000 mg | Freq: Once | INTRAMUSCULAR | Status: AC | PRN

## 2012-02-23 MED ORDER — METOCLOPRAMIDE HCL 5 MG/ML IJ SOLN: 5.0000 mg | Freq: Three times a day (TID) | INTRAMUSCULAR | Status: DC | PRN

## 2012-02-23 MED ORDER — ONDANSETRON HCL 4 MG/2ML IJ SOLN
INTRAMUSCULAR | Status: DC | PRN
  Administered 2012-02-23: 4 mg via INTRAVENOUS

## 2012-02-23 MED ORDER — LIDOCAINE-EPINEPHRINE 1.5-1:200000 % IJ SOLN
INTRAMUSCULAR | Status: DC | PRN
  Administered 2012-02-23: 20 mL

## 2012-02-23 MED ORDER — CHLORHEXIDINE GLUCONATE 4 % EX LIQD: 60.0000 mL | Freq: Once | CUTANEOUS | Status: DC

## 2012-02-23 MED ORDER — OXYCODONE HCL 5 MG PO TABS: 5.0000 mg | ORAL_TABLET | Freq: Once | ORAL | Status: AC | PRN

## 2012-02-23 MED ORDER — ROPIVACAINE HCL 5 MG/ML IJ SOLN
INTRAMUSCULAR | Status: DC | PRN
  Administered 2012-02-23: 20 mL via EPIDURAL

## 2012-02-23 MED ORDER — EPHEDRINE SULFATE 50 MG/ML IJ SOLN
INTRAMUSCULAR | Status: DC | PRN
  Administered 2012-02-23 (×2): 10 mg via INTRAVENOUS

## 2012-02-23 MED ORDER — LIDOCAINE HCL 1 % IJ SOLN
INTRAMUSCULAR | Status: DC | PRN
  Administered 2012-02-23: 2 mL via INTRADERMAL

## 2012-02-23 MED ORDER — LACTATED RINGERS IV SOLN
INTRAVENOUS | Status: DC
  Administered 2012-02-23: 10:00:00 via INTRAVENOUS

## 2012-02-23 MED ORDER — OXYCODONE-ACETAMINOPHEN 5-325 MG PO TABS
1.0000 | ORAL_TABLET | ORAL | Status: DC | PRN
  Administered 2012-02-23 – 2012-02-24 (×4): 2 via ORAL

## 2012-02-23 MED ORDER — METOCLOPRAMIDE HCL 5 MG PO TABS: 5.0000 mg | ORAL_TABLET | Freq: Three times a day (TID) | ORAL | Status: DC | PRN

## 2012-02-23 MED ORDER — FENTANYL CITRATE 0.05 MG/ML IJ SOLN
25.0000 ug | INTRAMUSCULAR | Status: DC | PRN
  Administered 2012-02-23: 50 ug via INTRAVENOUS

## 2012-02-23 MED ORDER — CEPHALEXIN 500 MG PO CAPS: 500.0000 mg | ORAL_CAPSULE | Freq: Three times a day (TID) | ORAL | Status: AC

## 2012-02-23 MED ORDER — CEFAZOLIN SODIUM 1-5 GM-% IV SOLN
1.0000 g | Freq: Once | INTRAVENOUS | Status: AC
  Administered 2012-02-23: 1 g via INTRAVENOUS

## 2012-02-23 MED ORDER — FENTANYL CITRATE 0.05 MG/ML IJ SOLN
50.0000 ug | INTRAMUSCULAR | Status: DC | PRN
  Administered 2012-02-23: 100 ug via INTRAVENOUS

## 2012-02-23 MED ORDER — HYDROMORPHONE HCL 2 MG PO TABS: 2.0000 mg | ORAL_TABLET | ORAL | Status: AC | PRN

## 2012-02-23 MED ORDER — HYDROMORPHONE HCL PF 1 MG/ML IJ SOLN
0.5000 mg | INTRAMUSCULAR | Status: DC | PRN
  Administered 2012-02-23 (×2): 0.5 mg via INTRAVENOUS

## 2012-02-23 MED ORDER — PROPOFOL 10 MG/ML IV EMUL
INTRAVENOUS | Status: DC | PRN
  Administered 2012-02-23: 150 mg via INTRAVENOUS

## 2012-02-23 SURGICAL SUPPLY — 82 items
BAG DECANTER FOR FLEXI CONT (MISCELLANEOUS) IMPLANT
BANDAGE ADHESIVE 1X3 (GAUZE/BANDAGES/DRESSINGS) IMPLANT
BANDAGE ELASTIC 3 VELCRO ST LF (GAUZE/BANDAGES/DRESSINGS) ×2 IMPLANT
BANDAGE GAUZE ELAST BULKY 4 IN (GAUZE/BANDAGES/DRESSINGS) ×2 IMPLANT
BLADE AVERAGE 25X9 (BLADE) ×2 IMPLANT
BLADE MINI RND TIP GREEN BEAV (BLADE) ×2 IMPLANT
BLADE OSC/SAG .038X5.5 CUT EDG (BLADE) IMPLANT
BLADE SURG 15 STRL LF DISP TIS (BLADE) ×2 IMPLANT
BLADE SURG 15 STRL SS (BLADE) ×2
BNDG COHESIVE 1X5 TAN STRL LF (GAUZE/BANDAGES/DRESSINGS) IMPLANT
BNDG ELASTIC 2 VLCR STRL LF (GAUZE/BANDAGES/DRESSINGS) ×2 IMPLANT
BNDG ESMARK 4X9 LF (GAUZE/BANDAGES/DRESSINGS) ×2 IMPLANT
BRUSH SCRUB EZ PLAIN DRY (MISCELLANEOUS) ×2 IMPLANT
BUR EGG 3PK/BX (BURR) IMPLANT
BUR EGG/OVAL CARBIDE (BURR) IMPLANT
BUR FAST CUTTING MED (BURR) IMPLANT
BUR STRYKER (BURR) IMPLANT
BUR SWANSON PILOT POINT (BURR) IMPLANT
BUR SWANSON PILOT POINT MED (BURR) ×2 IMPLANT
CANISTER SUCTION 1200CC (MISCELLANEOUS) ×2 IMPLANT
CLOTH BEACON ORANGE TIMEOUT ST (SAFETY) ×2 IMPLANT
CORDS BIPOLAR (ELECTRODE) ×2 IMPLANT
COVER MAYO STAND STRL (DRAPES) ×2 IMPLANT
COVER TABLE BACK 60X90 (DRAPES) ×2 IMPLANT
CUFF TOURNIQUET SINGLE 18IN (TOURNIQUET CUFF) ×2 IMPLANT
DECANTER SPIKE VIAL GLASS SM (MISCELLANEOUS) IMPLANT
DRAPE EXTREMITY T 121X128X90 (DRAPE) ×2 IMPLANT
DRAPE OEC MINIVIEW 54X84 (DRAPES) ×2 IMPLANT
DRAPE SURG 17X23 STRL (DRAPES) ×2 IMPLANT
GAUZE SPONGE 4X4 16PLY XRAY LF (GAUZE/BANDAGES/DRESSINGS) ×2 IMPLANT
GAUZE XEROFORM 1X8 LF (GAUZE/BANDAGES/DRESSINGS) IMPLANT
GLOVE BIO SURGEON STRL SZ 6.5 (GLOVE) ×6 IMPLANT
GLOVE BIOGEL M STRL SZ7.5 (GLOVE) IMPLANT
GLOVE BIOGEL PI IND STRL 6.5 (GLOVE) ×1 IMPLANT
GLOVE BIOGEL PI IND STRL 7.0 (GLOVE) ×1 IMPLANT
GLOVE BIOGEL PI INDICATOR 6.5 (GLOVE) ×1
GLOVE BIOGEL PI INDICATOR 7.0 (GLOVE) ×1
GLOVE ORTHO TXT STRL SZ7.5 (GLOVE) ×2 IMPLANT
GOWN BRE IMP PREV XXLGXLNG (GOWN DISPOSABLE) ×2 IMPLANT
GOWN PREVENTION PLUS XLARGE (GOWN DISPOSABLE) ×4 IMPLANT
IMPLANT JOINT FINDER SZ 4 (Finger Joint) ×4 IMPLANT
KWIRE 4.0 X .035IN (WIRE) ×2 IMPLANT
NEEDLE 27GAX1X1/2 (NEEDLE) IMPLANT
NEEDLE BLUNT 17GA (NEEDLE) IMPLANT
NEEDLE HYPO 22GX1.5 SAFETY (NEEDLE) IMPLANT
NS IRRIG 1000ML POUR BTL (IV SOLUTION) ×2 IMPLANT
PACK BASIN DAY SURGERY FS (CUSTOM PROCEDURE TRAY) ×2 IMPLANT
PAD CAST 3X4 CTTN HI CHSV (CAST SUPPLIES) ×1 IMPLANT
PADDING CAST ABS 4INX4YD NS (CAST SUPPLIES)
PADDING CAST ABS COTTON 4X4 ST (CAST SUPPLIES) IMPLANT
PADDING CAST COTTON 3X4 STRL (CAST SUPPLIES) ×1
PADDING UNDERCAST 2  STERILE (CAST SUPPLIES) ×2 IMPLANT
SLEEVE SCD COMPRESS KNEE MED (MISCELLANEOUS) ×2 IMPLANT
SLING ARM FOAM STRAP MED (SOFTGOODS) ×2 IMPLANT
SPLINT FNGR BALL END 5/8X4.25 (SOFTGOODS) IMPLANT
SPLINT PLASTALUME BALL 4 1/4IN (SOFTGOODS)
SPLINT PLASTER CAST XFAST 3X15 (CAST SUPPLIES) ×14 IMPLANT
SPLINT PLASTER XTRA FASTSET 3X (CAST SUPPLIES) ×14
SPONGE GAUZE 4X4 12PLY (GAUZE/BANDAGES/DRESSINGS) ×2 IMPLANT
STOCKINETTE 4X48 STRL (DRAPES) ×2 IMPLANT
STRIP CLOSURE SKIN 1/2X4 (GAUZE/BANDAGES/DRESSINGS) ×2 IMPLANT
SUT ETHIBOND 3-0 V-5 (SUTURE) ×4 IMPLANT
SUT ETHILON 5 0 P 3 18 (SUTURE)
SUT FIBERWIRE 4-0 18 TAPR NDL (SUTURE)
SUT MERSILENE 4 0 P 3 (SUTURE) ×2 IMPLANT
SUT NYLON ETHILON 5-0 P-3 1X18 (SUTURE) IMPLANT
SUT PROLENE 3 0 PS 2 (SUTURE) ×4 IMPLANT
SUT PROLENE 4 0 P 3 18 (SUTURE) IMPLANT
SUT STEEL 0 (SUTURE)
SUT STEEL 0 18XMFL TIE 17 (SUTURE) IMPLANT
SUT VIC AB 4-0 P-3 18XBRD (SUTURE) ×1 IMPLANT
SUT VIC AB 4-0 P3 18 (SUTURE) ×1
SUTURE FIBERWR 4-0 18 TAPR NDL (SUTURE) IMPLANT
SYR 20CC LL (SYRINGE) IMPLANT
SYR 3ML 23GX1 SAFETY (SYRINGE) IMPLANT
SYR BULB 3OZ (MISCELLANEOUS) ×2 IMPLANT
SYR CONTROL 10ML LL (SYRINGE) IMPLANT
TOWEL OR 17X24 6PK STRL BLUE (TOWEL DISPOSABLE) ×2 IMPLANT
TRAY DSU PREP LF (CUSTOM PROCEDURE TRAY) ×2 IMPLANT
TUBE CONNECTING 20X1/4 (TUBING) ×2 IMPLANT
UNDERPAD 30X30 INCONTINENT (UNDERPADS AND DIAPERS) ×2 IMPLANT
WATER STERILE IRR 1000ML POUR (IV SOLUTION) IMPLANT

## 2012-02-23 NOTE — H&P (Signed)
Meghan [name] is an 66 y.o. female.   Chief Complaint: Rheumatoid arthritis hand deformities left. 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 She is 21 months status post reconstruction of the right hand. After detailed informed consent at the office she presents for left hand transfer of extensor  carpi radialis longus to extensor carpi ulnaris and metacarpal phalangeal joint implant arthroplasty. Questions regarding the anticipated surgery were invited and answered in detail in the office.    Past Medical History  Diagnosis Date  . COPD (chronic obstructive pulmonary disease)   . Diabetes mellitus   . Hypertension   . Depression   . Anxiety   . Arthritis     RA  . GERD (gastroesophageal reflux disease)   . Osteopenia   . DDD (degenerative disc disease)   . Obese     Past Surgical History  Procedure Date  . Hand reconstruction 2011    right-multiple finger joint reconst  . Abdominal hysterectomy   . Cervical fusion   . Joint replacement     bilat knee replacements  . Cholecystectomy   . Foot arthroplasty     toes x2 rt foot    No family history on file. Social History:  reports that she has quit smoking. She does not have any smokeless tobacco history on file. She reports that she does not drink alcohol. Her drug history not on file.  Allergies:  Allergies  Allergen Reactions  . Iodine     syncopy  . Naproxen     hypotension    No prescriptions prior to admission    No results found for this or any previous visit (from the past 48 hour(s)). No results found.  Review of Systems  Constitutional: Negative for malaise/fatigue.  HENT:       Glasses   Eyes: Negative.   Respiratory:       History of asthma.  Controlled by medication.  Cardiovascular:       History of hypertension on medication    Gastrointestinal: Negative.   Genitourinary: Negative.   Musculoskeletal: Positive for joint pain.       Chronic rheumatoid arthritis, past and current use of Prednisone. Status post reconstruction of right hand in 2001.  Skin: Negative.   Neurological: Negative.  Negative for weakness.  Endo/Heme/Allergies: Negative.        History of type 2 diabetes.  Postmenopausal.  Psychiatric/Behavioral: Positive for depression. The patient is nervous/anxious.     There were no vitals taken for this visit. Physical Exam  Constitutional: She is oriented to person, place, and time. She appears well-developed.  HENT:  Head: Normocephalic and atraumatic.  Eyes: Pupils are equal, round, and reactive to light.  Neck: Neck supple.  Cardiovascular: Normal rate, regular rhythm and normal heart sounds.   Respiratory: Effort normal and breath sounds normal.  GI: Soft. Bowel sounds are normal.  Musculoskeletal:       Classic rheumatoid deformity of left hand with radial deviation of the wrist, ulnar drift and subluxation of the mp joints and moderate type 1 thumb collapse without pain.  Pinch is well preserved.  4+ intrinsic tightness.  Neurological: She is alert and oriented to person, place, and time. She has normal reflexes. No cranial nerve deficit. She exhibits normal muscle tone.  Skin: Skin is warm and dry.  Psychiatric: She has  a normal mood and affect. Her behavior is normal.     Assessment/Plan Chronic rheumatoid arthritis deformities of left hand and wrist. Plan to reconstruct left hand and wrist with tendon transfer to rebalance wrist alignment and to preform reconstruction of metacarpal phalangeal joints with silicone implant arthroplasty technique and intrinsic muscle releases.  The surgery, aftercare, potential risks and benefits were explained in detail. Questions were invited and answered in detail.  We will provide supplemental steroid during the postoperative period. We will observe  for 24 hours postoperatively for pain control and use of prophylactic antibiotics and to assure steroid support and to monitor vital signs.  Rambo Sarafian JR,Cully Luckow V 02/23/2012, 8:04 AM

## 2012-02-23 NOTE — Op Note (Signed)
[id number] 

## 2012-02-23 NOTE — Anesthesia Procedure Notes (Addendum)
Anesthesia Regional Block:  Axillary brachial plexus block  Pre-Anesthetic Checklist: ,, timeout performed, Correct Patient, Correct Site, Correct Laterality, Correct Procedure, Correct Position, site marked, Risks and benefits discussed,  Surgical consent,  Pre-op evaluation,  At surgeon's request and post-op pain management  Laterality: Left  Prep: chloraprep       Needles:   Needle Type: Other   (Arrow Echogenic)   Needle Length: 9cm  Needle Gauge: 21    Additional Needles:  Procedures: ultrasound guided and nerve stimulator Axillary brachial plexus block Narrative:  Start time: 02/23/2012 10:18 AM End time: 02/23/2012 10:33 AM Injection made incrementally with aspirations every 5 mL.  Performed by: Personally  Anesthesiologist: C Frederick  Additional Notes: Ultrasound guidance used to: id relevant anatomy, confirm needle position, local anesthetic spread, avoidance of vascular puncture. Picture saved. No complications. Block performed personally by Janetta Hora. Gelene Mink, MD    Supraclavicular block Procedure Name: LMA Insertion Performed by: York Grice Pre-anesthesia Checklist: Patient identified, Timeout performed, Emergency Drugs available, Suction available and Patient being monitored Patient Re-evaluated:Patient Re-evaluated prior to inductionOxygen Delivery Method: Circle system utilized Preoxygenation: Pre-oxygenation with 100% oxygen Intubation Type: IV induction Ventilation: Mask ventilation without difficulty LMA: LMA with gastric port inserted LMA Size: 4.0 Number of attempts: 1 Placement Confirmation: breath sounds checked- equal and bilateral and positive ETCO2 Tube secured with: Tape Dental Injury: Teeth and Oropharynx as per pre-operative assessment

## 2012-02-23 NOTE — OR Nursing (Signed)
Patient prepped with betadine even though documented iodine allergy.  Patient stated allergy was "smelling" betadine on another patient made her feel faint.  Dr. Teressa Senter aware.

## 2012-02-23 NOTE — Anesthesia Preprocedure Evaluation (Signed)
Anesthesia Evaluation  Patient identified by MRN, date of birth, ID band Patient awake    Reviewed: Allergy & Precautions, H&P , NPO status , Patient's Chart, lab work & pertinent test results, reviewed documented beta blocker date and time   Airway Mallampati: II TM Distance: >3 FB Neck ROM: full    Dental   Pulmonary COPD         Cardiovascular hypertension, Pt. on medications and Pt. on home beta blockers     Neuro/Psych PSYCHIATRIC DISORDERS Anxiety Depression negative neurological ROS     GI/Hepatic Neg liver ROS, GERD-  Medicated and Controlled,  Endo/Other  Oral Hypoglycemic Agents  Renal/GU negative Renal ROS  negative genitourinary   Musculoskeletal   Abdominal   Peds  Hematology negative hematology ROS (+)   Anesthesia Other Findings See surgeon's H&P   Reproductive/Obstetrics negative OB ROS                           Anesthesia Physical Anesthesia Plan  ASA: III  Anesthesia Plan: General   Post-op Pain Management:    Induction:   Airway Management Planned: LMA  Additional Equipment:   Intra-op Plan:   Post-operative Plan: Extubation in OR  Informed Consent: I have reviewed the patients History and Physical, chart, labs and discussed the procedure including the risks, benefits and alternatives for the proposed anesthesia with the patient or authorized representative who has indicated his/her understanding and acceptance.   Dental Advisory Given  Plan Discussed with: CRNA and Surgeon  Anesthesia Plan Comments:         Anesthesia Quick Evaluation

## 2012-02-23 NOTE — Anesthesia Postprocedure Evaluation (Signed)
  Anesthesia Post-op Note  Patient: Meghan [name]  Procedure(s) Performed: Procedure(s) (LRB): FINGER ARTHROPLASTY (Left)  Patient Location: PACU  Anesthesia Type: GA combined with regional for post-op pain  Level of Consciousness: awake and alert   Airway and Oxygen Therapy: Patient Spontanous Breathing  Post-op Pain: mild  Post-op Assessment: Post-op Vital signs reviewed, Patient's Cardiovascular Status Stable, Respiratory Function Stable, Patent Airway and No signs of Nausea or vomiting  Post-op Vital Signs: Reviewed and stable  Complications: No apparent anesthesia complications

## 2012-02-23 NOTE — Transfer of Care (Signed)
Immediate Anesthesia Transfer of Care Note  Patient: Meghan [name]  Procedure(s) Performed: Procedure(s) (LRB): FINGER ARTHROPLASTY (Left)  Patient Location: PACU  Anesthesia Type: General  Level of Consciousness: awake  Airway & Oxygen Therapy: Patient Spontanous Breathing and Patient connected to face mask oxygen  Post-op Assessment: Report given to PACU RN and Post -op Vital signs reviewed and stable  Post vital signs: Reviewed and stable  Complications: No apparent anesthesia complications

## 2012-02-23 NOTE — Progress Notes (Signed)
Assisted Dr. Frederick with left, ultrasound guided, axillary block. Side rails up, monitors on throughout procedure. See vital signs in flow sheet. Tolerated Procedure well. 

## 2012-02-23 NOTE — Brief Op Note (Signed)
02/23/2012  2:14 PM  PATIENT:  Meghan [name]  66 y.o. female  PRE-OPERATIVE DIAGNOSIS:  rheumatoid arthritis with multiple deformities of left hand including subluxation of metacarpal phalangeal joints and radial deviation of the wrist  POST-OPERATIVE DIAGNOSIS:  rheumatoid arthritis with significant soft tissue laxity of metacarpal phalangeal joints and radial deviation of the wrist  PROCEDURE: #1 left index finger Silastic arthroplasty with radial collateral ligament reconstruction and ulnar intrinsic release #2 left long finger Silastic arthroplasty with radial collateral ligament reconstruction and ulnar intrinsic release #3 left ring finger metacarpal phalangeal joint soft tissue arthroplasty with release of ulnar intrinsic and extensor centralization. #4 left small finger soft tissue arthroplasty and ulnar intrinsic and abductor digiti minimii release with extensor digiti minimi centralization. #5 transfer of extensor carpi radialis brevis to extensor carpi ulnaris     SURGEON:  Surgeon(s) and Role:    * Wyn Forster., MD - Primary  PHYSICIAN ASSISTANT:   ASSISTANTS: Surgical assistant  ANESTHESIA:   general  EBL:  Total I/O In: 1500 [I.V.:1500] Out: -   BLOOD ADMINISTERED:none  DRAINS: none   LOCAL MEDICATIONS USED:  ropivicaine and lidocaine axillary block  SPECIMEN:  No Specimen  DISPOSITION OF SPECIMEN:  N/A  COUNTS:  YES  TOURNIQUET:   Total Tourniquet Time Documented: Upper Arm (Right) - 103 minutes  DICTATION: .Other Dictation: Dictation Number [id number]  PLAN OF CARE: Admit for overnight observation  PATIENT DISPOSITION:  PACU - hemodynamically stable.

## 2012-02-24 ENCOUNTER — Encounter (HOSPITAL_BASED_OUTPATIENT_CLINIC_OR_DEPARTMENT_OTHER): Payer: Self-pay | Admitting: Orthopedic Surgery

## 2012-02-24 MED FILL — Lidocaine Inj 1.5% w/ Epinephrine-1:200000 (PF): INTRAMUSCULAR | Qty: 30 | Status: AC

## 2012-02-24 NOTE — Op Note (Signed)
Meghan [name], MAZUREK NO.:  [id number]  MEDICAL RECORD NO.:  [id number]  LOCATION:                                 FACILITY:  PHYSICIAN:  Katy Fitch. Jadden Yim, M.D.      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  OPERATION: 1. Left index finger metacarpophalangeal joint silastic arthroplasty     with radial collateral ligament reconstruction, extensor     centralization and ulnar intrinsic release. 2. Left long finger silastic metacarpophalangeal joint implant     arthroplasty with radial collateral ligament reconstruction,     extensor centralization, and ulnar intrinsic release. 3. Soft tissue arthroplasty of left ring finger metacarpophalangeal     joint with ulnar intrinsic release and extensor centralization. 4. Soft tissue arthroplasty of left small finger metacarpophalangeal     joint with abductor digiti minimi and other ulnar intrinsic tendon     release and centralization of extensor digiti minimi tendon with     reconstruction of radial sagittal fibers and realignment of common     extensor to small finger. 5. Transfer of extensor carpi radialis longus to extensor carpi     ulnaris to correct radial deviation of wrist and to prevent     supination deformity of carpus.  OPERATING SURGEON:  Katy Fitch. Ralf Konopka, MD  ASSISTANT:  Surgical assistant.  ANESTHESIA:  General by LMA supplemented by a left axillary ropivacaine and lidocaine block.  SUPERVISING ANESTHESIOLOGIST:  Janetta Hora. Gelene Mink, MD  INDICATIONS:  Meghan [name] is a well-known  66 year old woman from [place], [place].  She is status post reconstruction of her right hand in the fall of 2011 for rheumatoid-like deformities.  She had implant arthroplasty of the index and long finger and soft tissue realignment of the ring and small fingers.  We also performed stabilization of her wrist by transfer of the extensor carpi radialis longus to the extensor carpi ulnaris, and performed a left thumb metacarpophalangeal joint arthrodesis due to a Z collapse with pain.  On the left side, Ms. Pangallo did not have a painful thumb, but did have a mild type 1 Z collapse.  She had complete subluxation of the index and long finger metacarpophalangeal joints with marked ulnar drift and intrinsic tightness.  She had moderate subluxation of the ring and small finger MP joints with ulnar translocation of the extensor mechanisms due to loss of integrity of the radial sagittal fibers of the extensor hood.  She had significant radial deviation of her wrist, which tends to exacerbate the ulnar drift of the fingers.  We provided detailed informed consent in the office recommending a similar procedure as we performed on the right side, however, due to the fact that she had no pain in her thumb, we did not recommend proceeding with arthrodesis of the thumb MP  joint at this time.  Questions were invited and answered in detail.  She was noted to have type 2 diabetes that was reasonably controlled. Her fasting glucose at the surgical facility today was 136.  She has been on chronic steroid medication and will be provided perioperative support of Solu-Cortef.  The potential risks and benefits of surgery were discussed in detail.  PROCEDURE:  [name] is brought to room 6 of the [facility] and placed in a supine position on the operating table.  Dr. Gelene Mink had provided detailed anesthesia informed consent in the holding area and recommended general anesthesia  supplemented by a left ropivacaine and lidocaine axillary block for perioperative comfort.  In room 6 under Dr. Thornton Dales direct supervision, general anesthesia by LMA technique was induced followed by routine Betadine scrub and paint of the left upper extremity.  A pneumatic tourniquet was applied to the proximal left brachium.  Following exsanguination of left arm with Esmarch bandage, arterial tourniquet was inflated to 220 mmHg.  The procedure commenced with a routine surgical time-out.  The initial incision was overlying the insertion of the extensor carpi radialis longus at the base of the index metacarpal.  Subcutaneous tissues were carefully divided taking care to identify the dorsal branch of the radial artery and the radial superficial sensory branches.  The tendon was released from the index metacarpal followed by a second incision in the dorsal forearm identifying the extensor carpi radialis longus and brevis.  The longus was retracted with an Allis clamp followed by release of its proximal fascia.  The extensor carpi radialis longus then rerouted in an ulnar subcutaneous direction to the extensor carpi ulnaris where a third incision proximal to the 6th dorsal compartment closure of a 3 cm segment of the extensor carpi ulnaris, followed by a 3 pass Pulvertaft weave with the end of the transferred tendon buried within the extensor carpi ulnaris.  Multiple corner sutures of 3-0 Ethibond were used to secure the Pulvertaft weave.  Satisfactory balancing of the wrist in 10 degrees of ulnar deviation was accomplished.  The transferred tendons did correct the supination potential of the carpus on the radius as well.  These wounds were then repaired with intradermal 3-0 Prolene and Steri- Strips.  Attention was then directed to the metacarpophalangeal joints of the index, long, ring, and small fingers.  A transverse incision was fashioned across the dorsum of the hand.  The  dorsal veins were carefully protected and the dorsal sensory branches were protected.  The radial sagittal fibers of all 4 metacarpophalangeal joints were completely attenuated with ulnar translocation of the extensors.  We released the radial sagittal fibers from the radial aspect of the extensor digitorum longus tendons to each finger subsequently under the capsule performed synovectomy and noted in the index and long fingers significant injury to the hyaline cartilage, however, in the ring and small fingers there was substantially less injury to the hyaline cartilage.  The collateral ligaments were attenuated and the radial sagittal fibers were attenuated.  The ulnar intrinsics were contracted. We meticulously identified the ulnar intrinsic tendon of the index, long, ring, and small fingers with careful retraction and use of a skin hook followed by release of the ulnar intrinsic.  The index and long finger metacarpal heads were exposed with release of the ulnar collateral ligaments and radial collateral ligaments.  Taking care to build in a small amount of radial deviation, the metacarpal heads of the index and long finger metacarpals were excised with  an oscillating saw, followed by further synovectomy and tailoring of residual osteophytes. Following standard Swanson technique, the intramedullary canals of the proximal phalanges of the index and long finger were prepared with a bone awl followed by hand rasp to accept a size 4 [facility] silastic implant arthroplasty components.  Grommets were not utilized. In the ring and small finger, there was not significant damage to the hyaline cartilage, only soft tissue contracture and laxity.  We meticulously released the ulnar intrinsics and partially released the ulnar sagittal fibers, subsequently translocated the extensor digitorum longus tendons to the midline of the fingers and reefed the radial sagittal fibers with through tendon  technique brought back as a pants- over-vest repair to the radial sagittal fibers.  Multiple interrupted sutures of 3-0 Ethibond were used to suture the radial sagittal fiber reconstructions.  My impression was that Ms. Needles does not have typical rheumatoid arthritis and may have a variant of lupus or another inflammatory connective tissue diseases causing her joint laxity and contracture. She did not have the typical erosions in the index and long finger MP joints particularly upon careful inspection of the resected bone fragments.  In the small finger, the abductor digiti minimi tendon was identified and released.  The ulnar intrinsic was released.  The extensor digiti minimi was centralized and secured by reefing of the radial sagittal fibers.  We realigned the extensor digitorum longus of the small fingers well with pants-over-vest type technique.  Ultimately, we correctly realigned the extensors of the index, long, ring, and small fingers, and achieved satisfactory alignment.  We then placed size 4 [facility] implant arthroplasty components with no-touch technique in the index and long finger capsules.  Subsequently repaired the radial collateral ligament through bone drill holes correcting the fingers in slight supination and radial deviation.  Care was taken to assure that the proximal phalanx was colinear with the metacarpal heads.  After satisfactory completion of all soft tissue release and realignment of all 4 rays, we irrigated the wound, obtained hemostasis, and released the tourniquet for a total tourniquet time of 1 hour and 43 minutes. The skin was repaired with subcutaneous 4-0 Vicryl and intradermal 3-0 Prolene segmental sutures with Steri-Strips.  There were no apparent complications.  For aftercare, Ms. Cagley will be admitted to [facility] with anticipated IV prophylactic antibiotics in the form of Ancef 1 g IV q.6 hours x3 doses and appropriate analgesics  in the form p.o. and IV Dilaudid and/or p.o. oxycodone.  She was provided 100 mg of Solu-Cortef for perioperative stress given her long-term prednisone.  She will resume her routine prednisone dose with instructions to take a double dose on the first postoperative day.     Katy Fitch Matthe Sloane, M.D.     RVS/MEDQ  D:  02/23/2012  T:  02/24/2012  Job:  [id number]  cc:   Beverely Risen, MD

## 2012-02-25 NOTE — Brief Op Note (Signed)
02/23/2012 - 02/24/2012  2:44 PM  PATIENT:  Arrie Aran  66 y.o. female  PRE-OPERATIVE DIAGNOSIS:  rheumatoid arthritis  POST-OPERATIVE DIAGNOSIS:  rheumatoid arthritis  PROCEDURE:  Procedure(s) (LRB): FINGER ARTHROPLASTY (Left)  SURGEON:  Surgeon(s) and Role:    * Wyn Forster., MD - Primary  PHYSICIAN ASSISTANT:   ASSISTANTS: nurse   ANESTHESIA:   general  EBL:  Total I/O In: 1683.7 [P.O.:1252; I.V.:281.7; IV Piggyback:150] Out: 1050 [Urine:1050]  BLOOD ADMINISTERED:none  DRAINS: none   LOCAL MEDICATIONS USED:  ropivicaine axillary block  SPECIMEN:  No Specimen  DISPOSITION OF SPECIMEN:  N/A  COUNTS:  YES  TOURNIQUET:   Total Tourniquet Time Documented: Upper Arm (Right) - 103 minutes  DICTATION: .Other Dictation: Dictation Number [id number]  PLAN OF CARE: Discharge to home after PACU  PATIENT DISPOSITION:  PACU - hemodynamically stable.

## 2012-05-28 ENCOUNTER — Emergency Department: Payer: Self-pay | Admitting: Emergency Medicine

## 2012-05-28 LAB — BASIC METABOLIC PANEL
Calcium, Total: 8.6 mg/dL (ref 8.5–10.1)
Co2: 28 mmol/L (ref 21–32)
EGFR (Non-African Amer.): >60
Glucose: 126 mg/dL — ABNORMAL HIGH (ref 65–99)
Osmolality: 289 (ref 275–301)
Potassium: 3.4 mmol/L — ABNORMAL LOW (ref 3.5–5.1)

## 2012-05-28 LAB — CBC
HCT: 37.5 % (ref 35.0–47.0)
HGB: 12.9 g/dL (ref 12.0–16.0)
MCH: 29.3 pg (ref 26.0–34.0)
MCHC: 34.4 g/dL (ref 32.0–36.0)
MCV: 85 fL (ref 80–100)
Platelet: 297 10*3/uL (ref 150–440)
RDW: 13.9 % (ref 11.5–14.5)

## 2012-06-09 ENCOUNTER — Ambulatory Visit: Payer: Self-pay | Admitting: Internal Medicine

## 2012-07-30 LAB — CBC
HCT: 40.2 % (ref 35.0–47.0)
HGB: 14.1 g/dL (ref 12.0–16.0)
MCH: 29.7 pg (ref 26.0–34.0)
MCV: 85 fL (ref 80–100)
Platelet: 248 10*3/uL (ref 150–440)
RBC: 4.74 10*6/uL (ref 3.80–5.20)
RDW: 13.5 % (ref 11.5–14.5)
WBC: 9.2 10*3/uL (ref 3.6–11.0)

## 2012-07-30 LAB — TROPONIN I: Troponin-I: <0.02 ng/mL

## 2012-07-30 LAB — BASIC METABOLIC PANEL
Anion Gap: 8 (ref 7–16)
Calcium, Total: 8.8 mg/dL (ref 8.5–10.1)
Co2: 28 mmol/L (ref 21–32)
Creatinine: 0.91 mg/dL (ref 0.60–1.30)
EGFR (African American): >60
EGFR (Non-African Amer.): >60
Glucose: 116 mg/dL — ABNORMAL HIGH (ref 65–99)

## 2012-07-30 LAB — CK TOTAL AND CKMB (NOT AT ARMC): CK-MB: 1.3 ng/mL (ref 0.5–3.6)

## 2012-07-31 ENCOUNTER — Observation Stay: Payer: Self-pay | Admitting: Student

## 2012-07-31 LAB — CK TOTAL AND CKMB (NOT AT ARMC)
CK-MB: 0.6 ng/mL (ref 0.5–3.6)
CK-MB: 1 ng/mL (ref 0.5–3.6)

## 2012-07-31 LAB — TROPONIN I: Troponin-I: <0.02 ng/mL

## 2012-08-01 LAB — BASIC METABOLIC PANEL
Calcium, Total: 7.9 mg/dL — ABNORMAL LOW (ref 8.5–10.1)
Chloride: 111 mmol/L — ABNORMAL HIGH (ref 98–107)
Co2: 26 mmol/L (ref 21–32)
Osmolality: 286 (ref 275–301)
Potassium: 3.6 mmol/L (ref 3.5–5.1)

## 2012-08-01 LAB — CBC WITH DIFFERENTIAL/PLATELET
Basophil #: 0 10*3/uL (ref 0.0–0.1)
Basophil %: 0.6 %
Eosinophil #: 0.2 10*3/uL (ref 0.0–0.7)
Eosinophil %: 2.3 %
Lymphocyte #: 2.6 10*3/uL (ref 1.0–3.6)
MCH: 29.1 pg (ref 26.0–34.0)
MCHC: 34 g/dL (ref 32.0–36.0)
Monocyte #: 0.6 x10 3/mm (ref 0.2–0.9)
Neutrophil %: 55.1 %
Platelet: 205 10*3/uL (ref 150–440)
RDW: 13.7 % (ref 11.5–14.5)
WBC: 7.6 10*3/uL (ref 3.6–11.0)

## 2012-08-01 LAB — LIPID PANEL
Cholesterol: 148 mg/dL (ref 0–200)
HDL Cholesterol: 47 mg/dL (ref 40–60)
Ldl Cholesterol, Calc: 80 mg/dL (ref 0–100)
VLDL Cholesterol, Calc: 21 mg/dL (ref 5–40)

## 2012-10-07 ENCOUNTER — Ambulatory Visit: Payer: Self-pay | Admitting: Internal Medicine

## 2012-11-29 ENCOUNTER — Emergency Department: Payer: Self-pay | Admitting: Emergency Medicine

## 2012-11-29 LAB — CBC
HCT: 41 % (ref 35.0–47.0)
MCH: 30 pg (ref 26.0–34.0)
MCHC: 34.5 g/dL (ref 32.0–36.0)
MCV: 87 fL (ref 80–100)
Platelet: 315 10*3/uL (ref 150–440)
RDW: 14.1 % (ref 11.5–14.5)
WBC: 9.2 10*3/uL (ref 3.6–11.0)

## 2012-11-29 LAB — CK TOTAL AND CKMB (NOT AT ARMC): CK, Total: 95 U/L (ref 21–215)

## 2012-11-29 LAB — COMPREHENSIVE METABOLIC PANEL
Anion Gap: 9 (ref 7–16)
BUN: 24 mg/dL — ABNORMAL HIGH (ref 7–18)
Calcium, Total: 9.1 mg/dL (ref 8.5–10.1)
Chloride: 107 mmol/L (ref 98–107)
Co2: 23 mmol/L (ref 21–32)
Creatinine: 1.26 mg/dL (ref 0.60–1.30)
EGFR (Non-African Amer.): 44 — ABNORMAL LOW
Glucose: 142 mg/dL — ABNORMAL HIGH (ref 65–99)
Osmolality: 284 (ref 275–301)
Potassium: 4 mmol/L (ref 3.5–5.1)
SGOT(AST): 17 U/L (ref 15–37)
SGPT (ALT): 34 U/L (ref 12–78)
Sodium: 139 mmol/L (ref 136–145)
Total Protein: 7.4 g/dL (ref 6.4–8.2)

## 2012-11-29 LAB — PRO B NATRIURETIC PEPTIDE: B-Type Natriuretic Peptide: 95 pg/mL (ref 0–125)

## 2012-11-29 LAB — TROPONIN I: Troponin-I: <0.02 ng/mL

## 2012-12-06 ENCOUNTER — Ambulatory Visit: Payer: Self-pay | Admitting: Specialist

## 2012-12-13 ENCOUNTER — Ambulatory Visit: Payer: Self-pay | Admitting: Specialist

## 2013-02-24 ENCOUNTER — Ambulatory Visit: Payer: Self-pay | Admitting: Rheumatology

## 2013-06-02 ENCOUNTER — Other Ambulatory Visit: Payer: Self-pay | Admitting: Internal Medicine

## 2013-06-02 DIAGNOSIS — R1310 Dysphagia, unspecified: Secondary | ICD-10-CM

## 2013-06-12 ENCOUNTER — Other Ambulatory Visit: Payer: PRIVATE HEALTH INSURANCE

## 2013-07-24 IMAGING — CR DG CHEST 2V
1 series · 2 of 2 positions shown · non-contrast
Comparison: none

REASON FOR EXAM: cough
COMMENTS:

[Series 1: pa · 0.17mm/px · 2 of 2 slices shown]
[im 1/2]
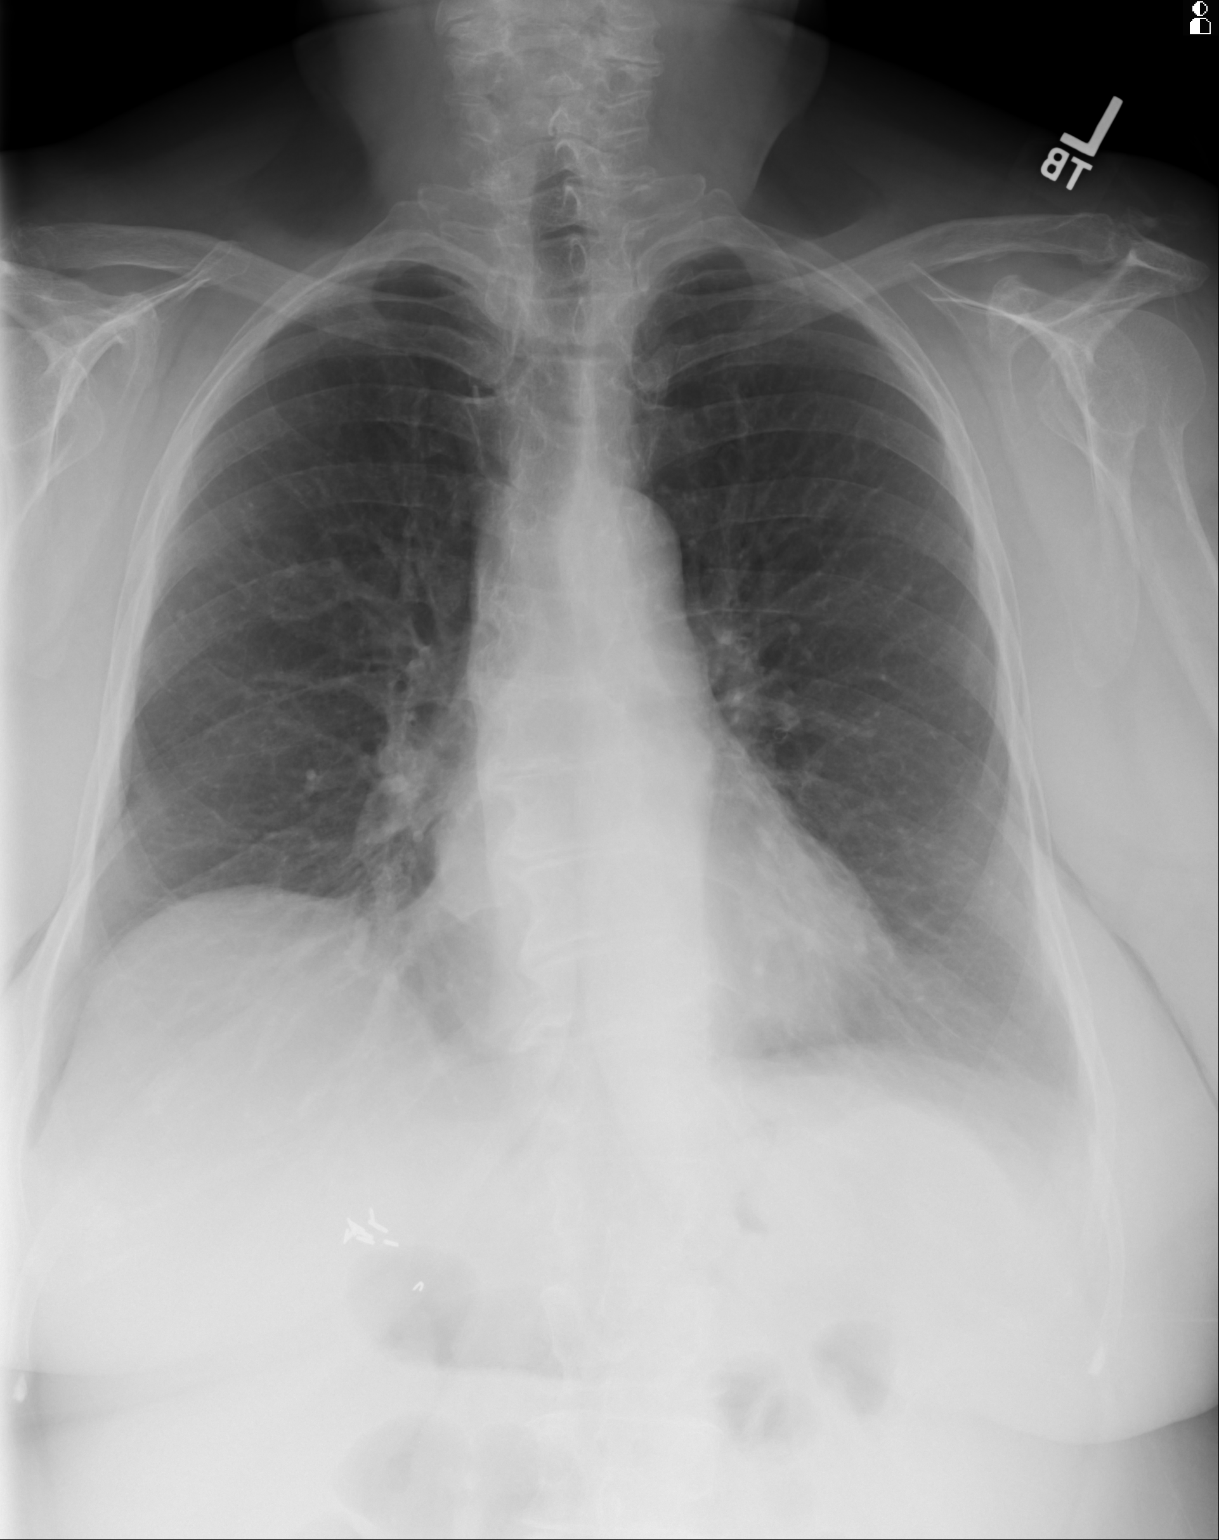
[im 2/2]
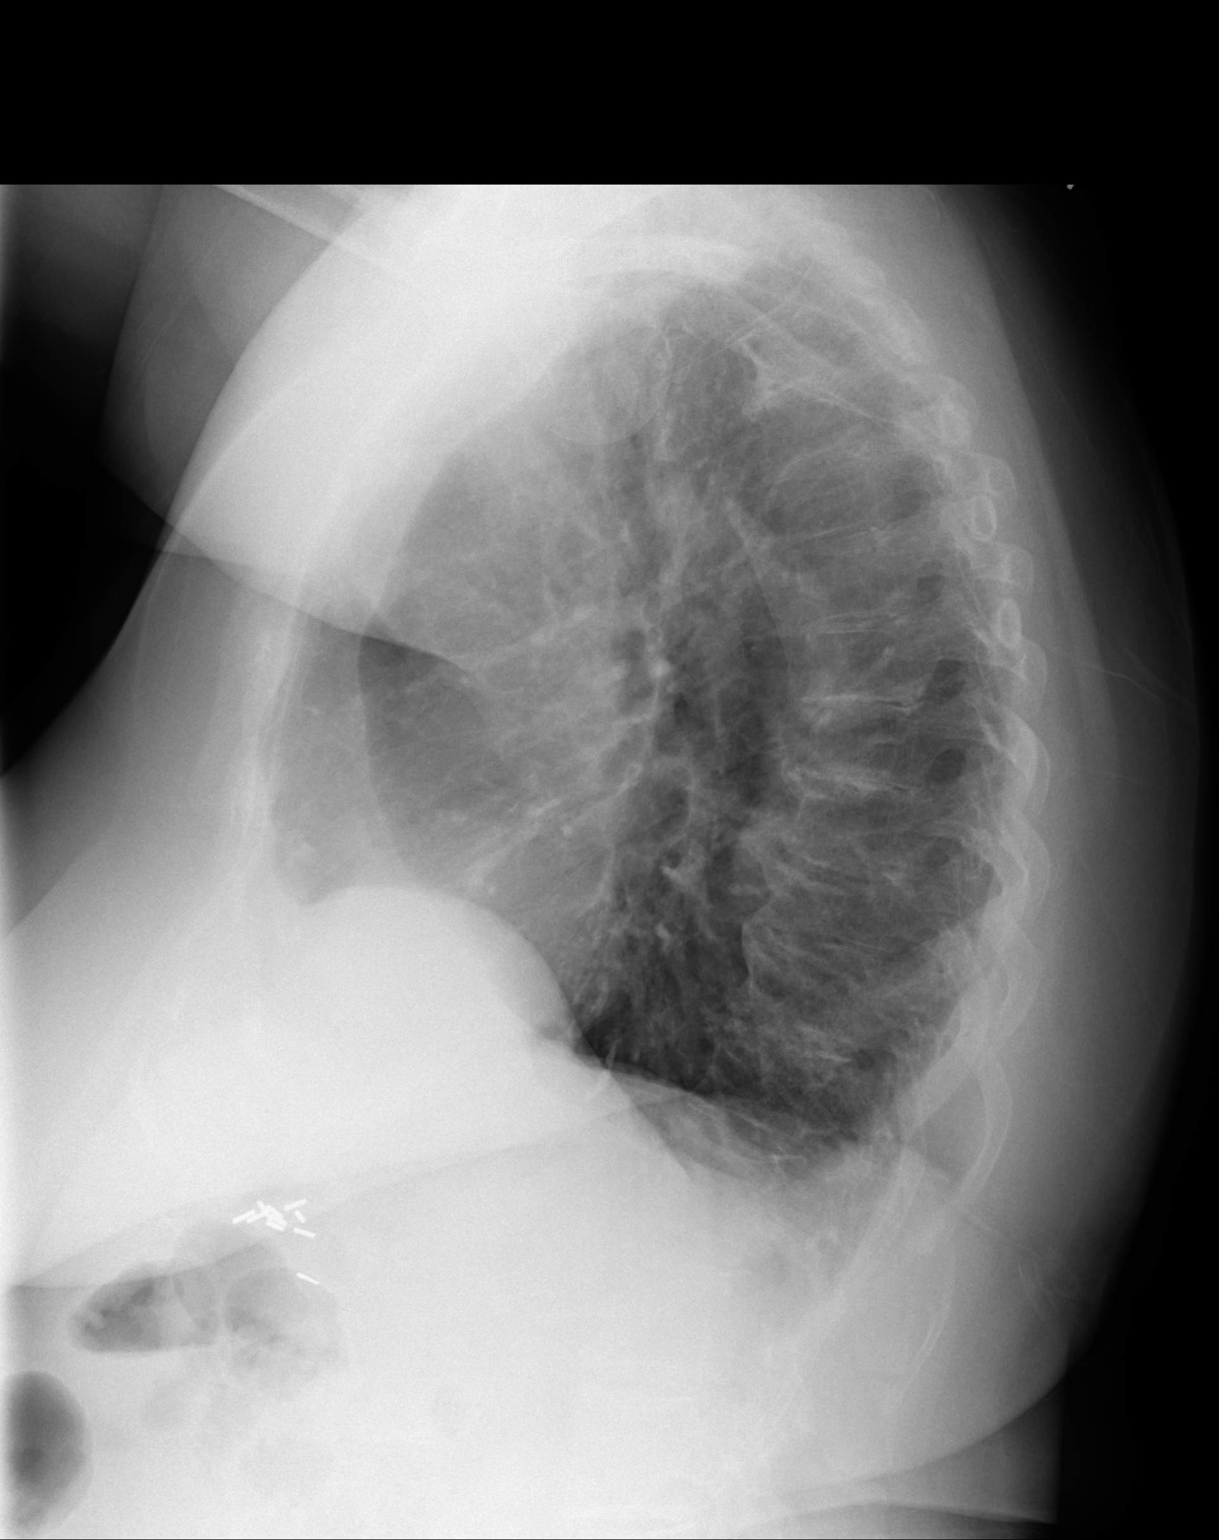

[2 of 2 positions shown; findings below may reference images not displayed]

PROCEDURE:     DXR - DXR CHEST PA (OR AP) AND LATERAL  - November 11, 2011  [DATE]

RESULT:     Comparison is made to the previous examination dated 23 February, 2010. The cardiac silhouette is normal. The lungs are clear. The bony and
mediastinal structures are unremarkable. Trace pleural effusions or pleural
thickening appear present. Eventration of the anterior aspect of the right
hemidiaphragm is again noted.
IMPRESSION: 1. Trace pleural effusions or pleural thickening are suggested in the
posterior gutters. Hyperinflation consistent with COPD. Some bony
degenerative changes appropriate for the patient's age and gender are noted.

## 2013-08-01 IMAGING — CT CT CHEST W/ CM
1 of 3 series · 15 of 28 positions shown, 19 images · IV contrast (agent unspecified)
Comparison: none

REASON FOR EXAM: persistent shortness of breath
COMMENTS:

PROCEDURE:     KCT - KCT CHEST WITH CONTRAST  - November 19, 2011 [DATE]
RESULT:     Comparison is made to prior study dated 02/23/2010.
TECHNIQUE: Helical 5 mm sections were obtained from the thoracic inlet
through the lung bases status post intravenous administration of 75 mL of
[id number].

[Series 2: chest w/ 5.0 i41f 3 · axial · 0.75mm/px · z∈[-292,-82]mm · 15 of 48 slices shown, 19 images]
[im 3/48  mediastinal]
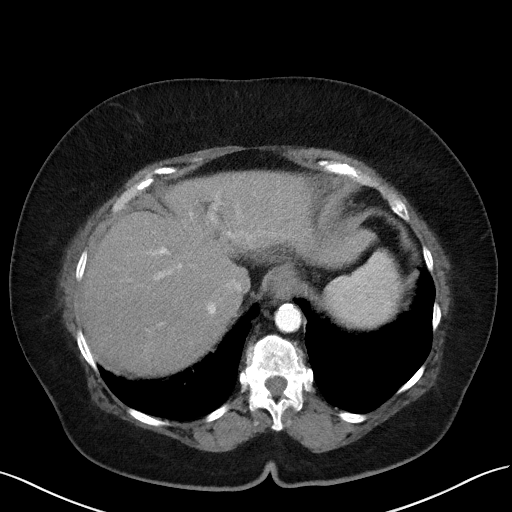
[im 3/48  lung]
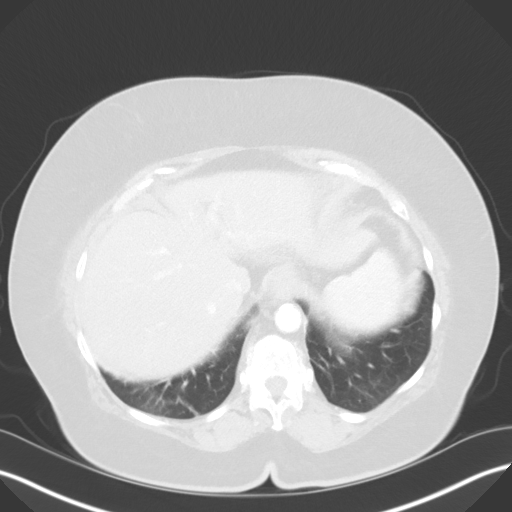
[im 6/48  lung]
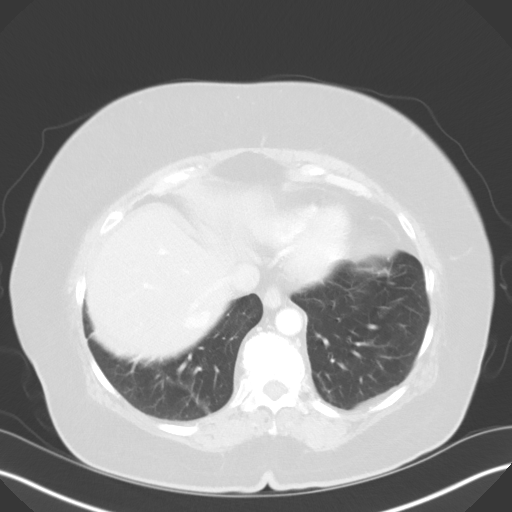
[im 8/48  lung]
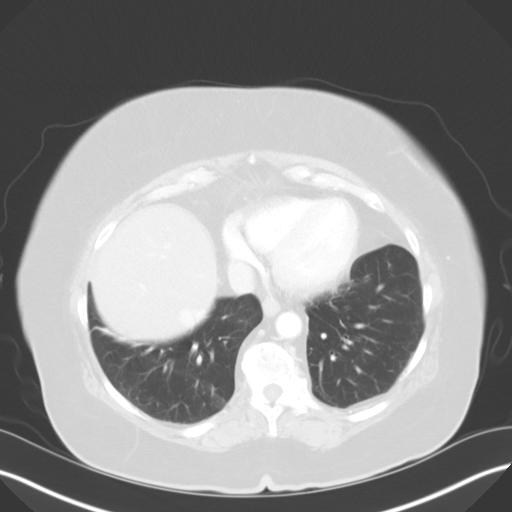
[im 11/48  lung]
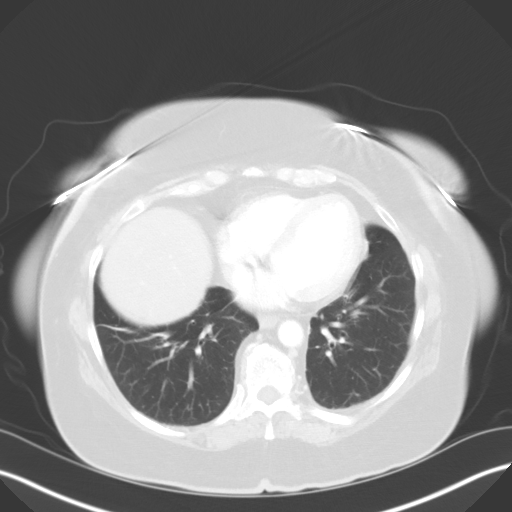
[im 16/48  mediastinal]
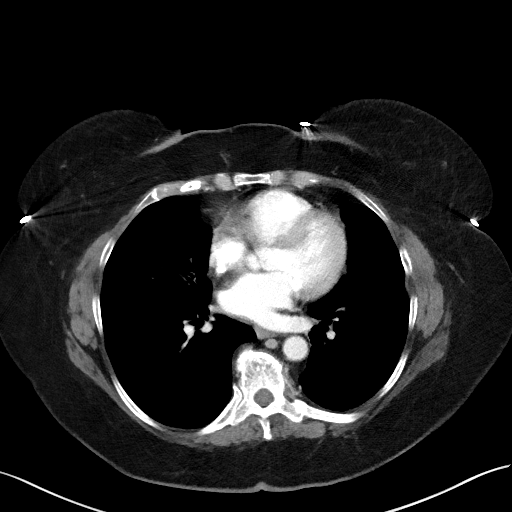
[im 16/48  lung]
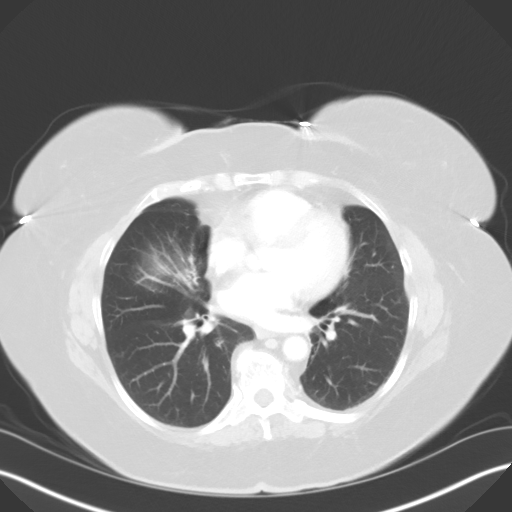
[im 19/48  lung]
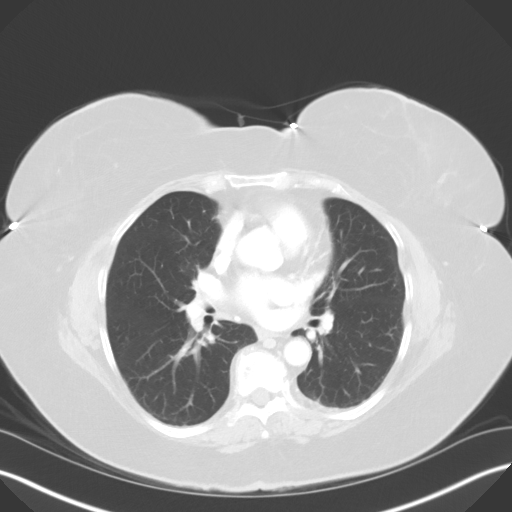
[im 21/48  lung]
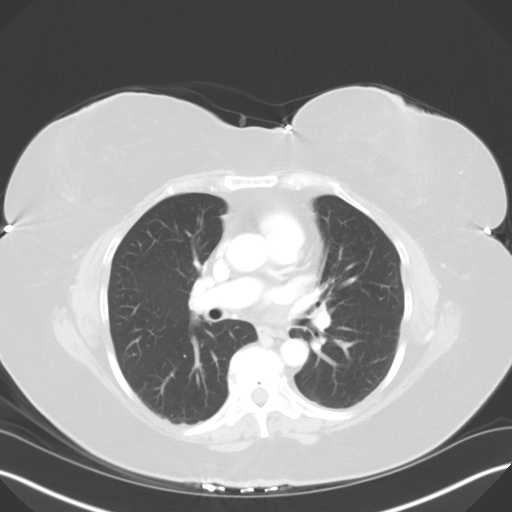
[im 24/48  lung]
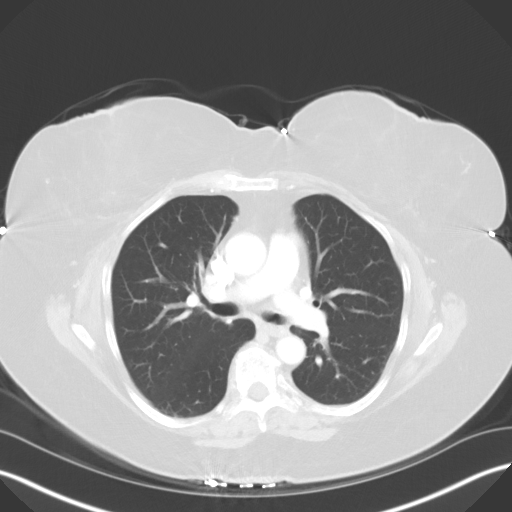
[im 27/48  mediastinal]
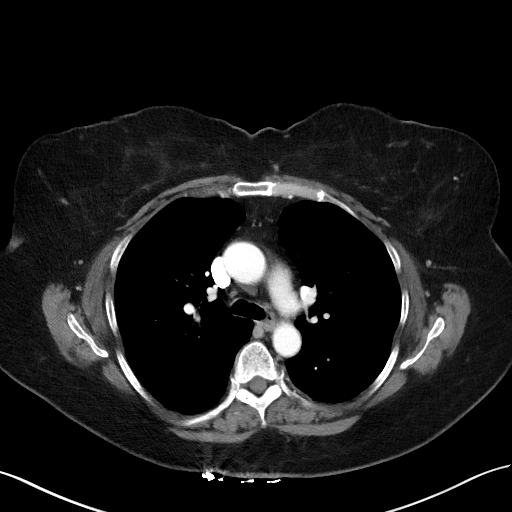
[im 27/48  lung]
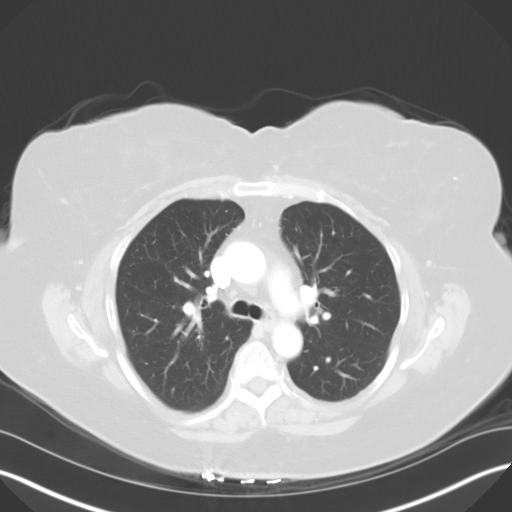
[im 29/48  lung]
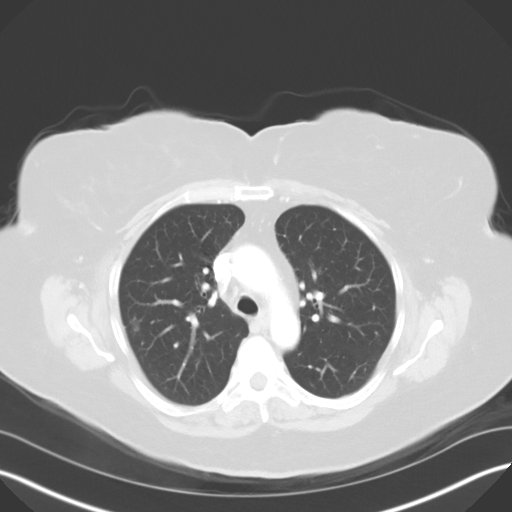
[im 32/48  lung]
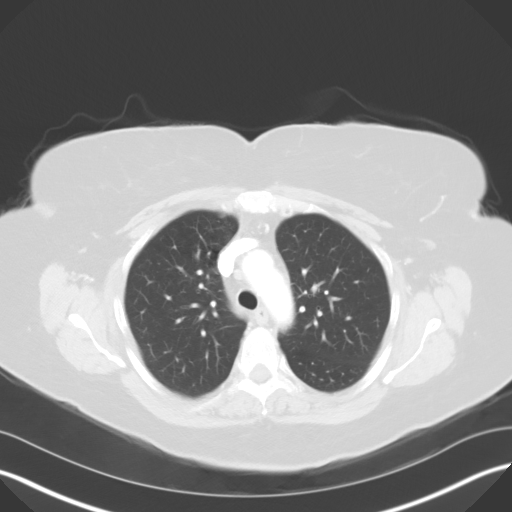
[im 37/48  lung]
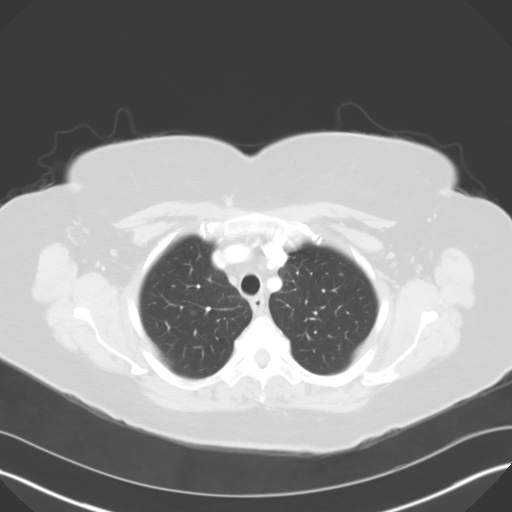
[im 40/48  mediastinal]
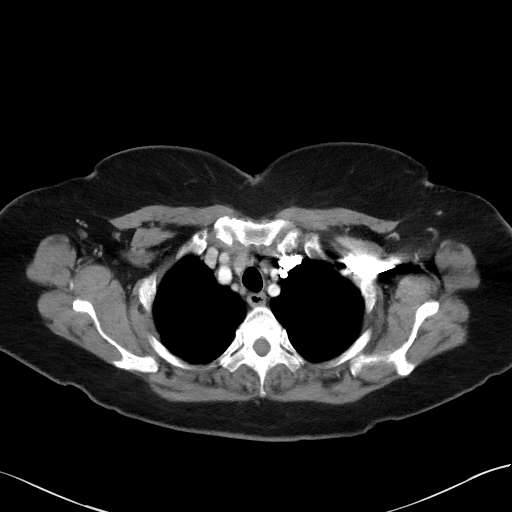
[im 40/48  lung]
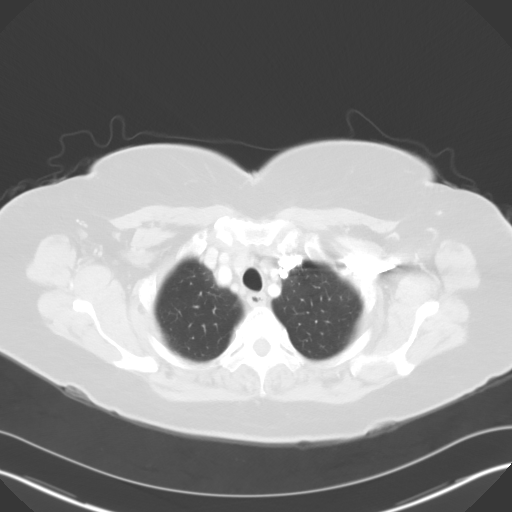
[im 42/48  lung]
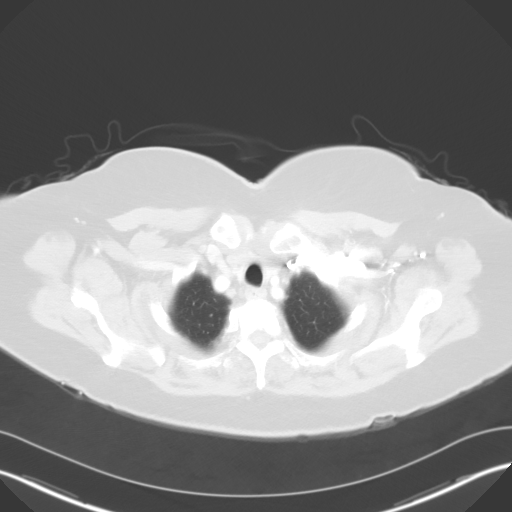
[im 45/48  lung]
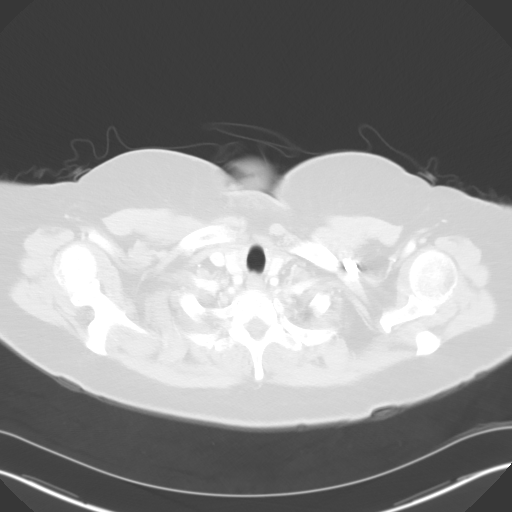

[15 of 28 positions shown; findings below may reference images not displayed]

FINDINGS: The mediastinum and hilar regions and structures demonstrate no
evidence of mediastinal or hilar adenopathy nor masses. The lung parenchyma
demonstrates mild areas of linear increased density within the right and
left lung bases. No focal regions of consolidation, focal infiltrates,
masses or nodules are identified.

The visualized upper abdominal viscera demonstrate no gross abnormalities.

There is no evidence of a thoracic aortic aneurysm.
IMPRESSION: 1. No CT evidence of focal or acute intrathoracic abnormalities.

## 2013-09-01 ENCOUNTER — Emergency Department: Payer: Self-pay | Admitting: Emergency Medicine

## 2013-10-19 DIAGNOSIS — J189 Pneumonia, unspecified organism: Secondary | ICD-10-CM | POA: Insufficient documentation

## 2013-10-19 DIAGNOSIS — E669 Obesity, unspecified: Secondary | ICD-10-CM | POA: Insufficient documentation

## 2013-10-19 DIAGNOSIS — E119 Type 2 diabetes mellitus without complications: Secondary | ICD-10-CM | POA: Insufficient documentation

## 2013-10-19 DIAGNOSIS — I10 Essential (primary) hypertension: Secondary | ICD-10-CM | POA: Insufficient documentation

## 2013-10-19 DIAGNOSIS — J449 Chronic obstructive pulmonary disease, unspecified: Secondary | ICD-10-CM | POA: Insufficient documentation

## 2013-10-19 DIAGNOSIS — E109 Type 1 diabetes mellitus without complications: Secondary | ICD-10-CM | POA: Insufficient documentation

## 2013-10-19 DIAGNOSIS — R0602 Shortness of breath: Secondary | ICD-10-CM | POA: Insufficient documentation

## 2013-10-19 DIAGNOSIS — R011 Cardiac murmur, unspecified: Secondary | ICD-10-CM | POA: Insufficient documentation

## 2014-01-09 DIAGNOSIS — R51 Headache: Secondary | ICD-10-CM

## 2014-01-09 DIAGNOSIS — R519 Headache, unspecified: Secondary | ICD-10-CM | POA: Insufficient documentation

## 2014-01-09 DIAGNOSIS — G479 Sleep disorder, unspecified: Secondary | ICD-10-CM | POA: Insufficient documentation

## 2014-01-26 DIAGNOSIS — G542 Cervical root disorders, not elsewhere classified: Secondary | ICD-10-CM | POA: Insufficient documentation

## 2014-03-26 DIAGNOSIS — M5481 Occipital neuralgia: Secondary | ICD-10-CM | POA: Insufficient documentation

## 2014-03-26 DIAGNOSIS — R262 Difficulty in walking, not elsewhere classified: Secondary | ICD-10-CM | POA: Insufficient documentation

## 2014-03-29 ENCOUNTER — Emergency Department: Payer: Self-pay | Admitting: Emergency Medicine

## 2014-04-12 IMAGING — CR DG CHEST 2V
1 series · 3 of 3 positions shown · non-contrast
Comparison: none

REASON FOR EXAM: CP
COMMENTS:   May transport without cardiac monitor

PROCEDURE:     DXR - DXR CHEST PA (OR AP) AND LATERAL  - July 30, 2012 [DATE]
RESULT:     Comparison: None

[Series 1: w chest pa · 0.14mm/px · 3 of 3 slices shown]
[im 1/3]
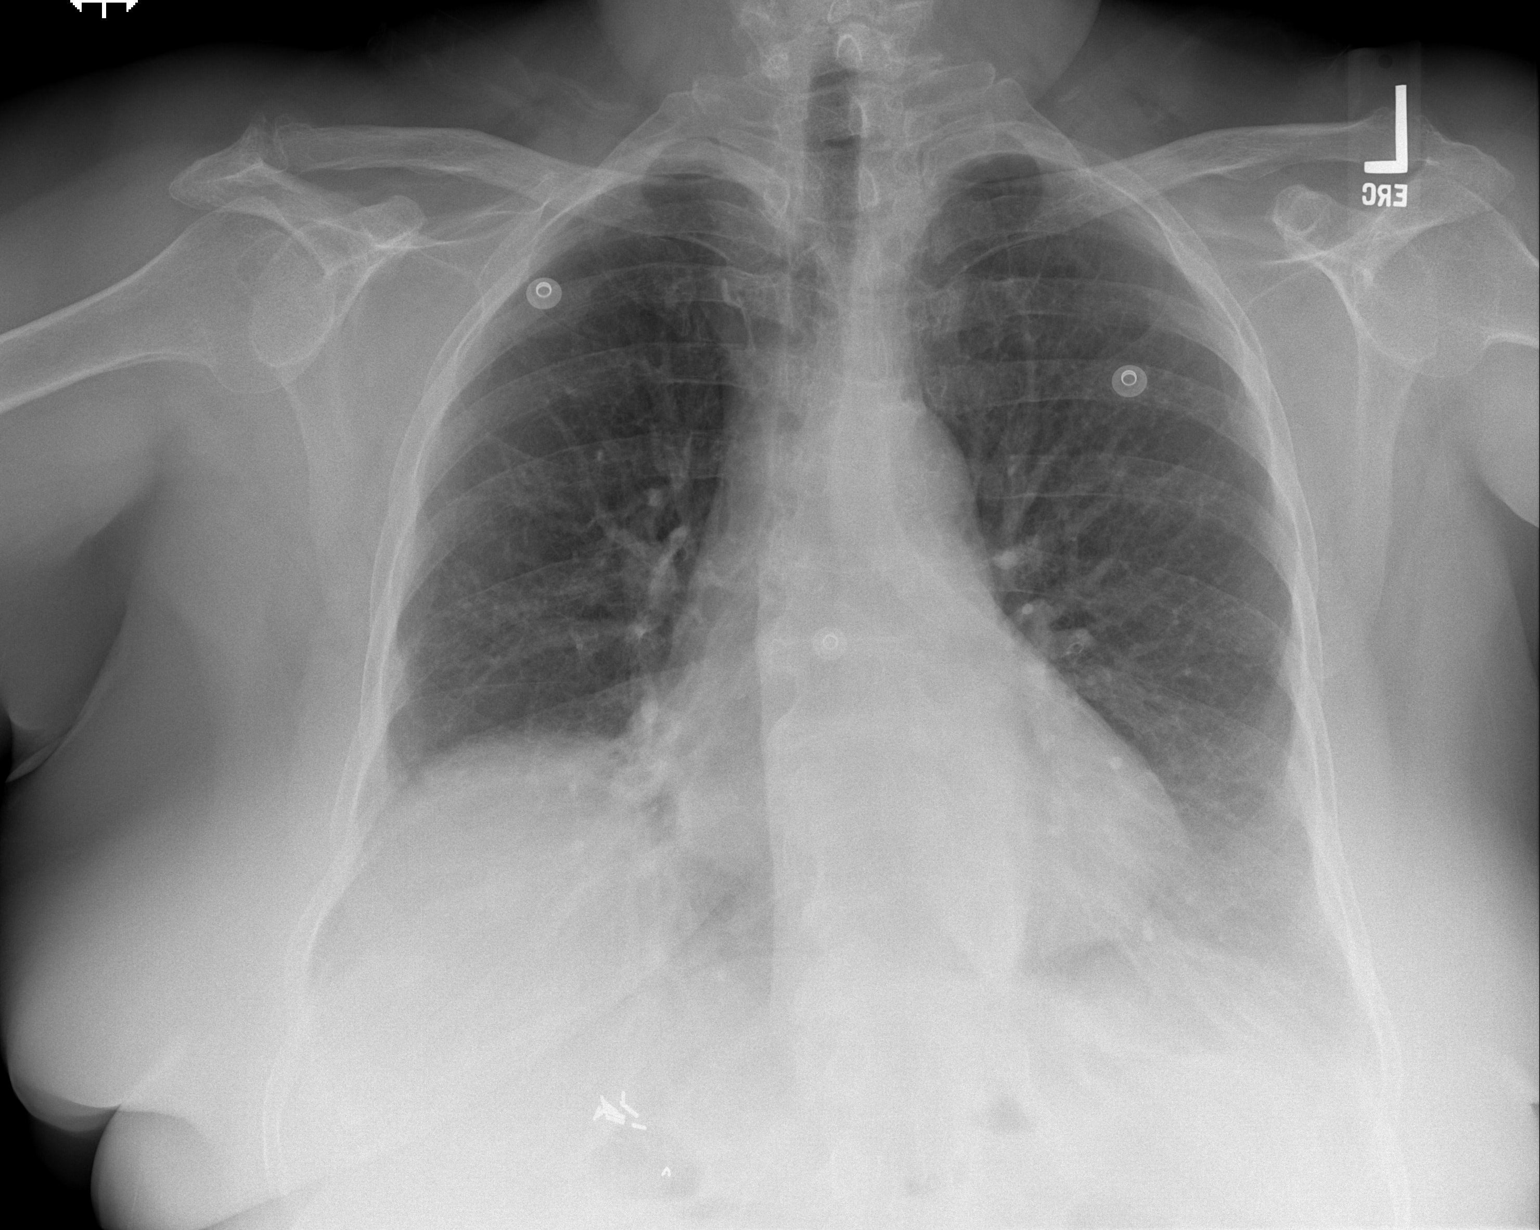
[im 2/3]
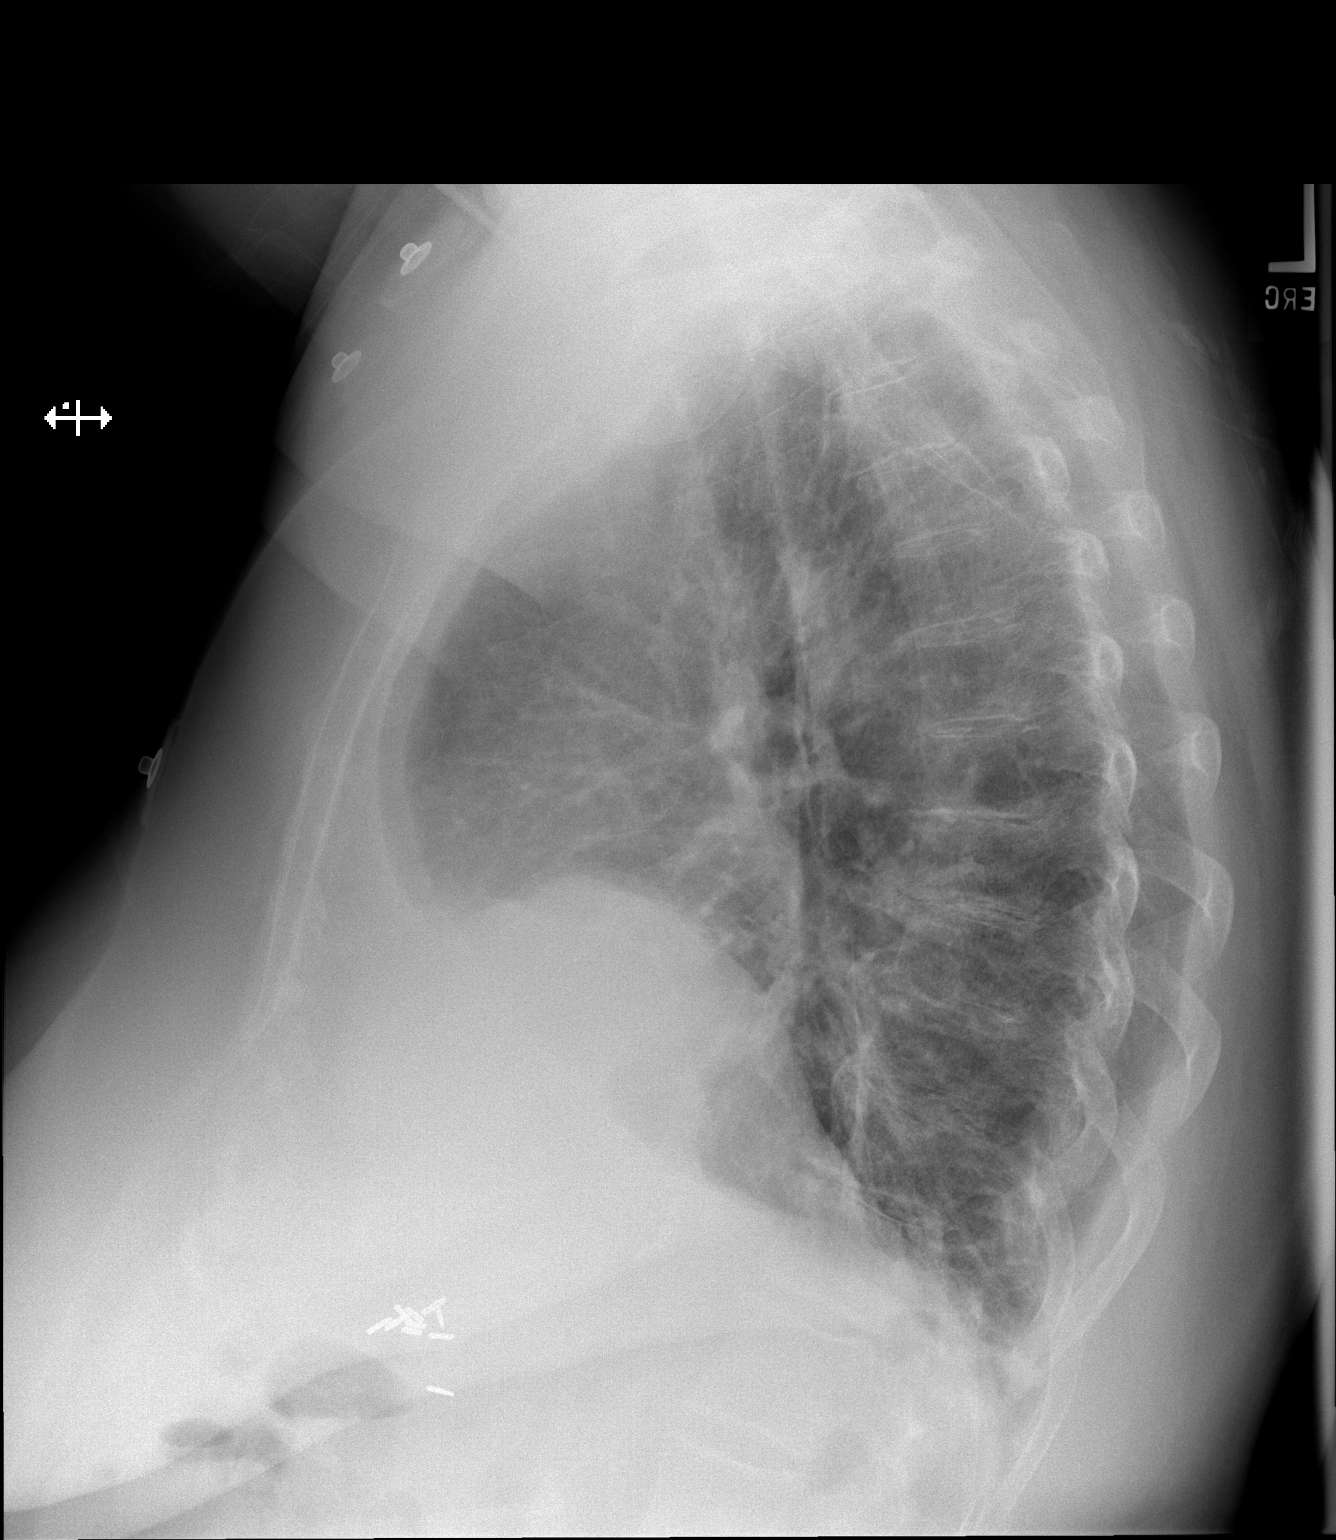
[im 3/3]
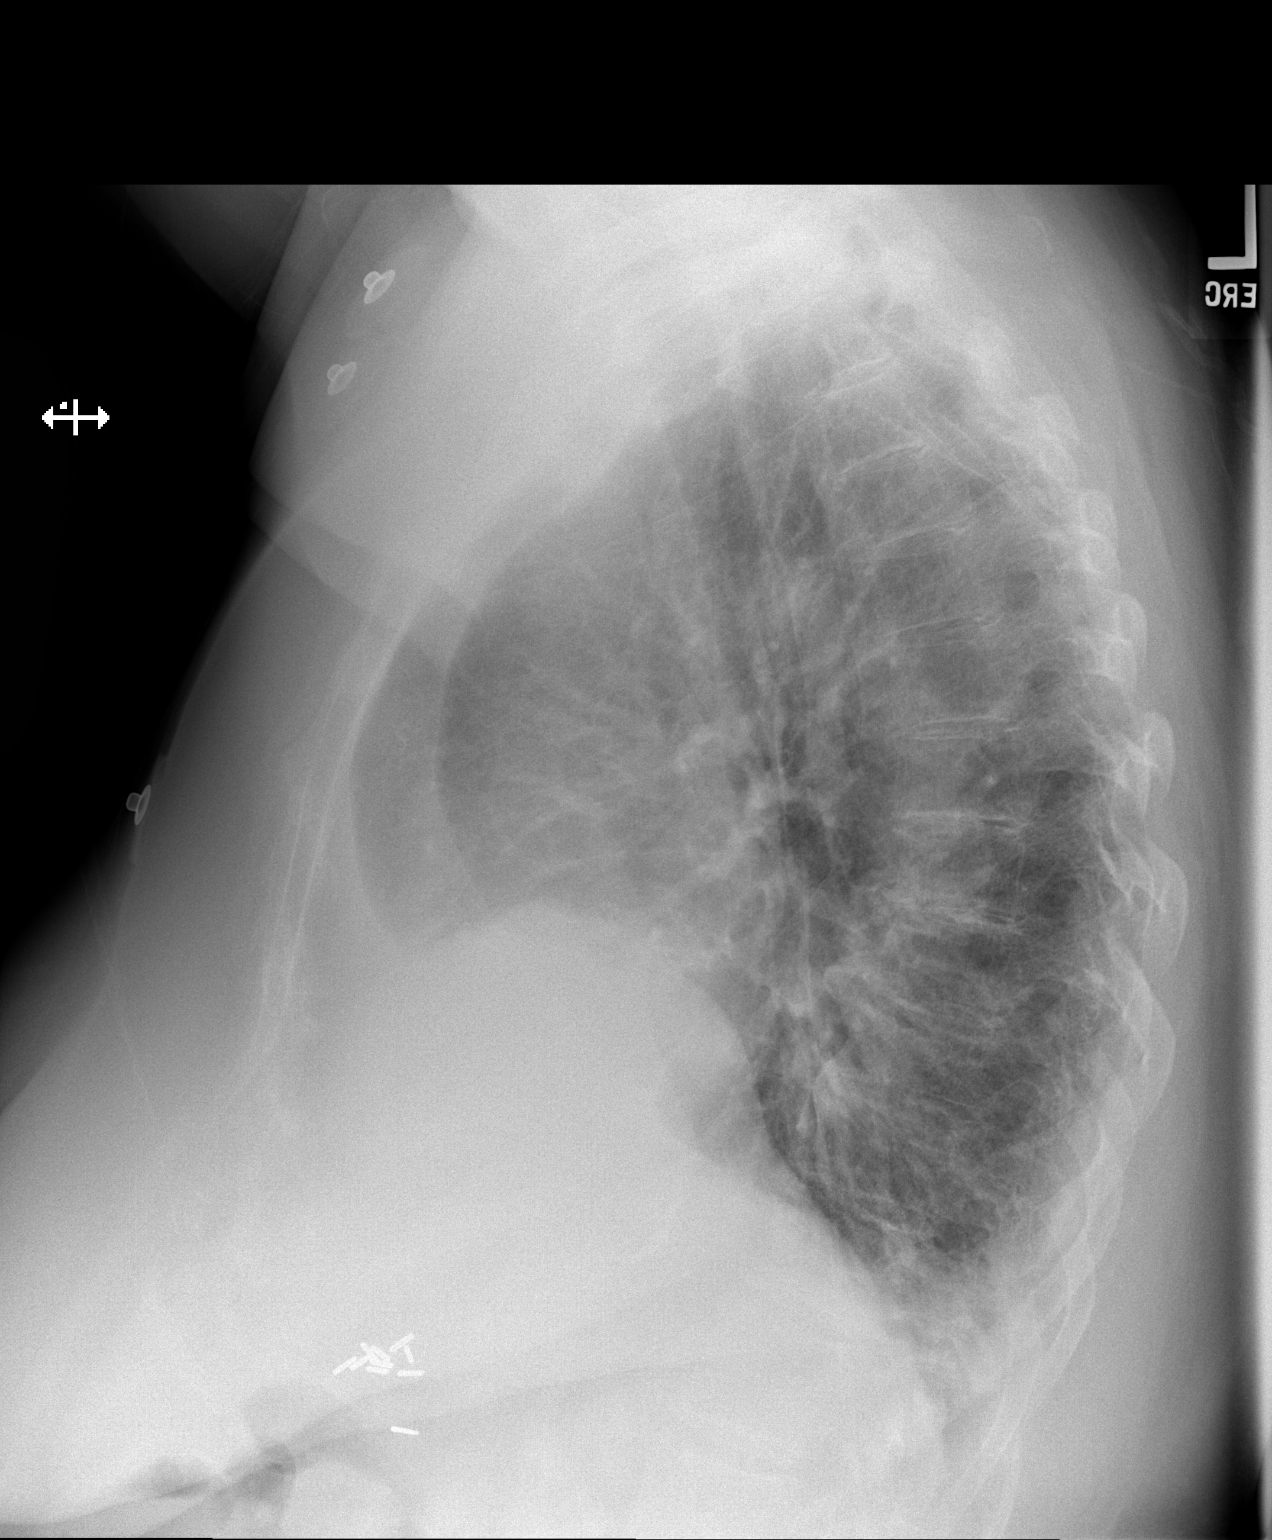

[3 of 3 positions shown; findings below may reference images not displayed]

FINDINGS: PA and lateral chest radiographs are provided. There is elevation of the
right diaphragm. There is no focal parenchymal opacity, pleural effusion, or
pneumothorax. The heart and mediastinum are unremarkable.  The osseous
structures are unremarkable.
IMPRESSION: No acute disease of the che[REDACTED]

## 2014-04-12 IMAGING — CT CT HEAD WITHOUT CONTRAST
1 series · 16 of 29 positions shown, 20 images · non-contrast
Comparison: none

REASON FOR EXAM: NEAR-SYNCOPE
COMMENTS:   May transport without cardiac monitor

PROCEDURE:     CT  - CT HEAD WITHOUT CONTRAST  - July 30, 2012 [DATE]
RESULT:     Comparison:  02/20/2008
TECHNIQUE: Multiple axial images from the foramen magnum to the vertex were
obtained without IV contrast.

[Series 2: soft tissue · axial · 0.42mm/px · z∈[-178,-48]mm · 16 of 29 slices shown, 20 images]
[im 2/29  brain]
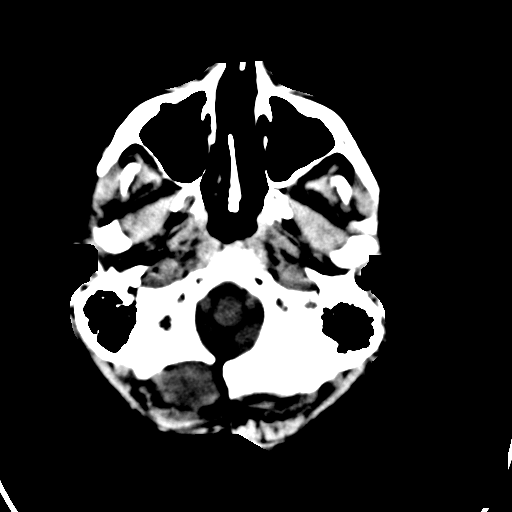
[im 2/29  bone]
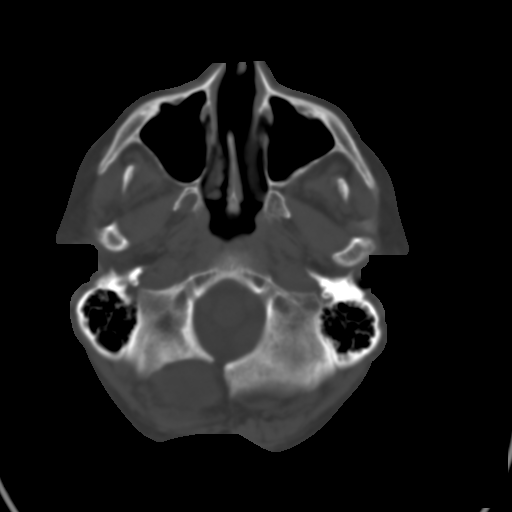
[im 4/29  brain]
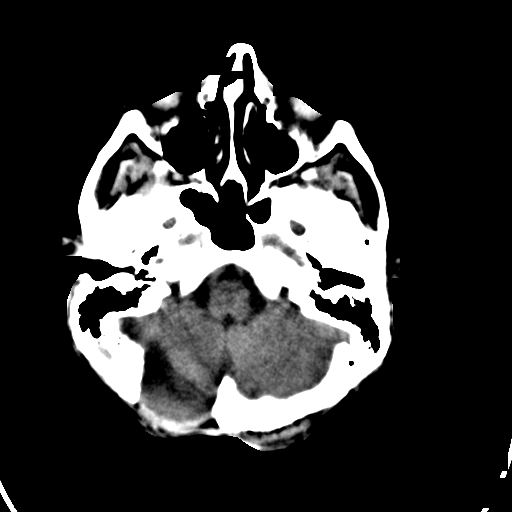
[im 6/29  brain]
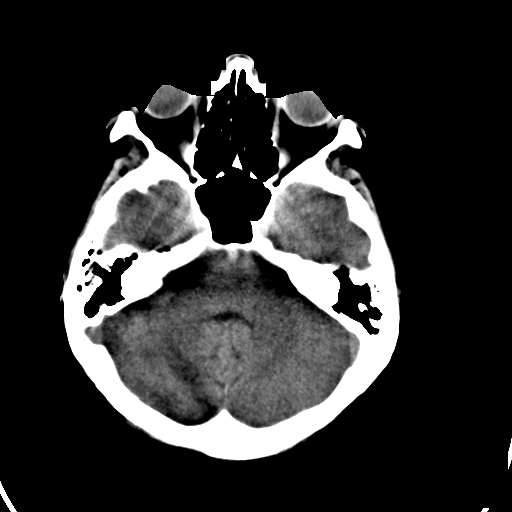
[im 7/29  brain]
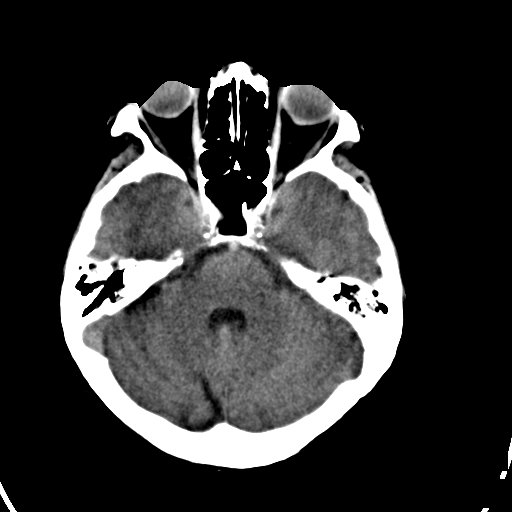
[im 9/29  brain]
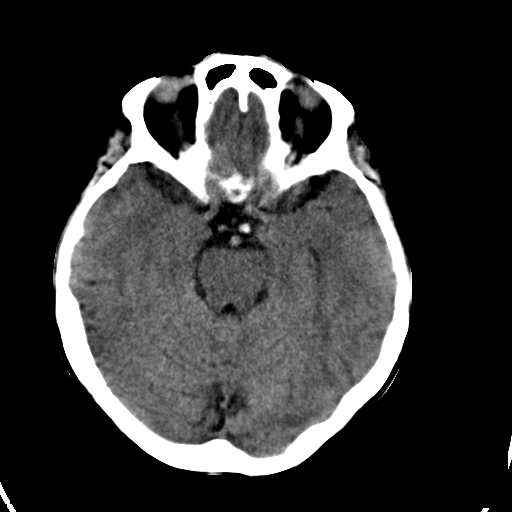
[im 9/29  bone]
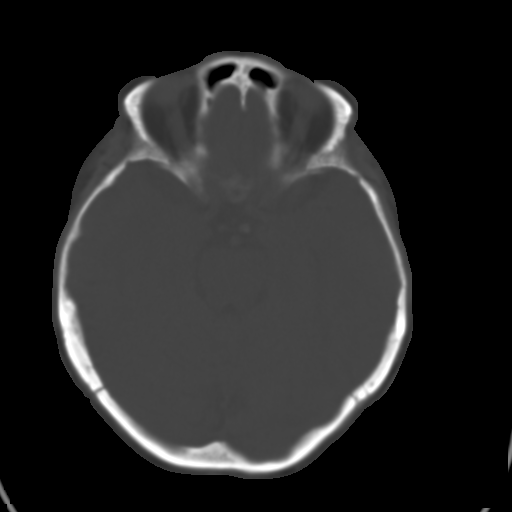
[im 11/29  brain]
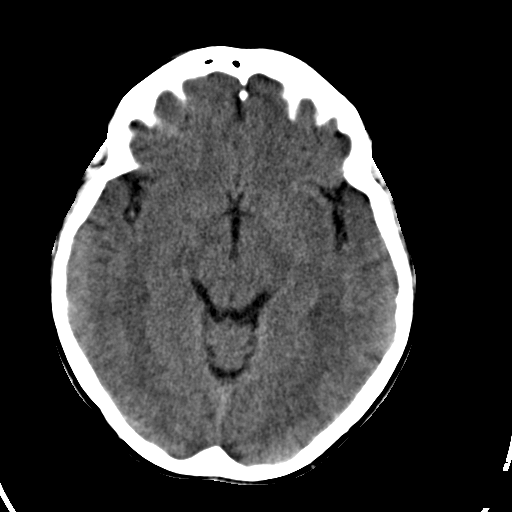
[im 12/29  brain]
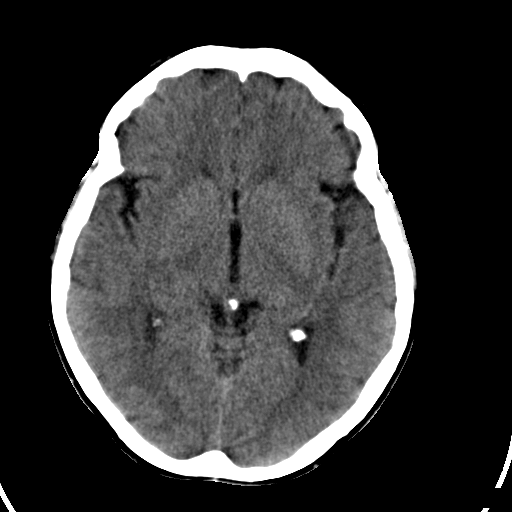
[im 14/29  brain]
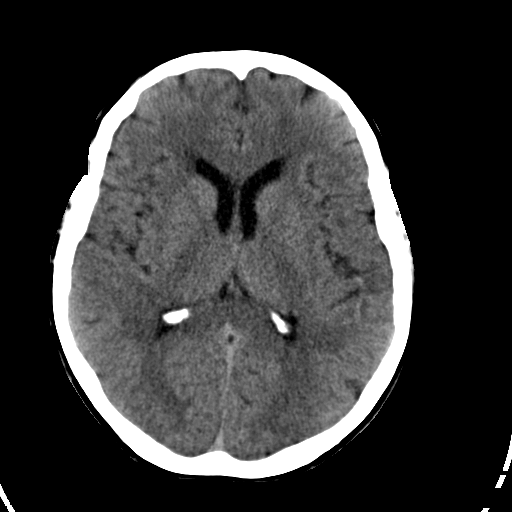
[im 16/29  brain]
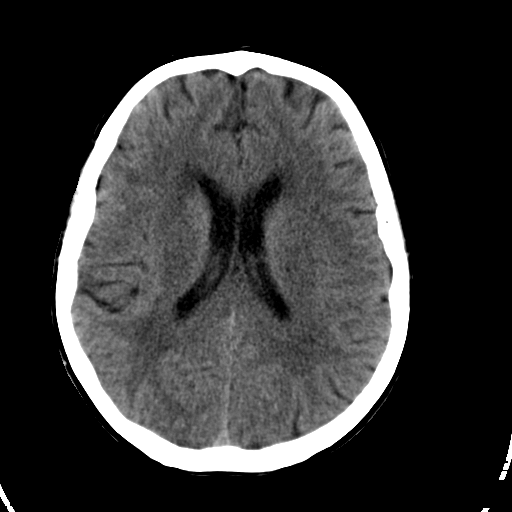
[im 16/29  bone]
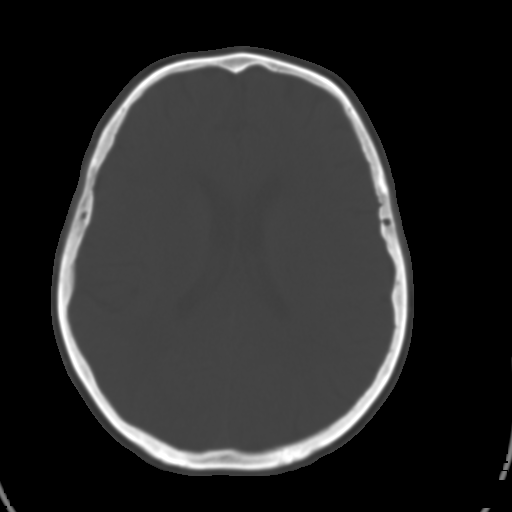
[im 18/29  brain]
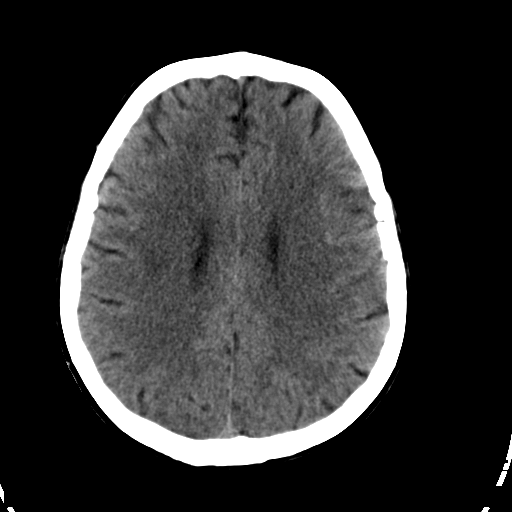
[im 19/29  brain]
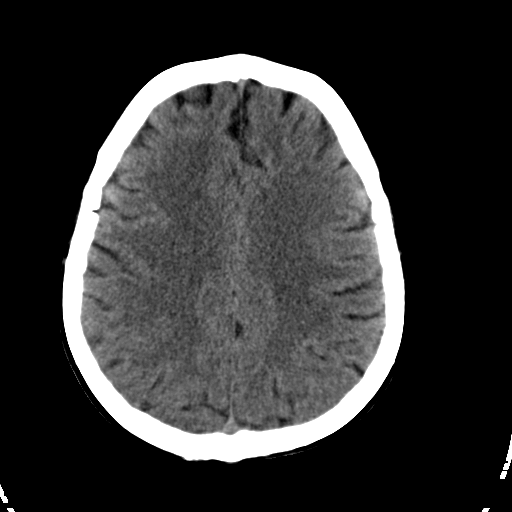
[im 21/29  brain]
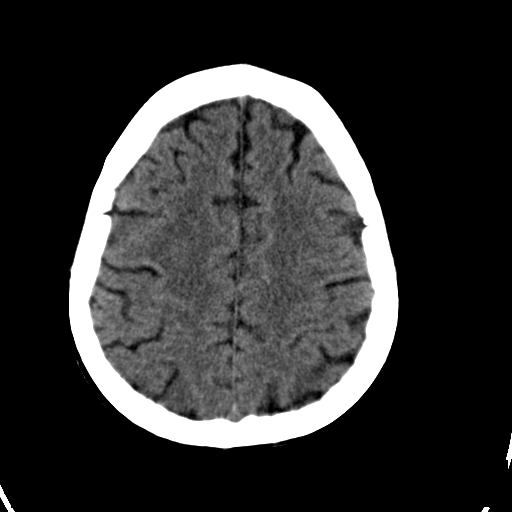
[im 23/29  brain]
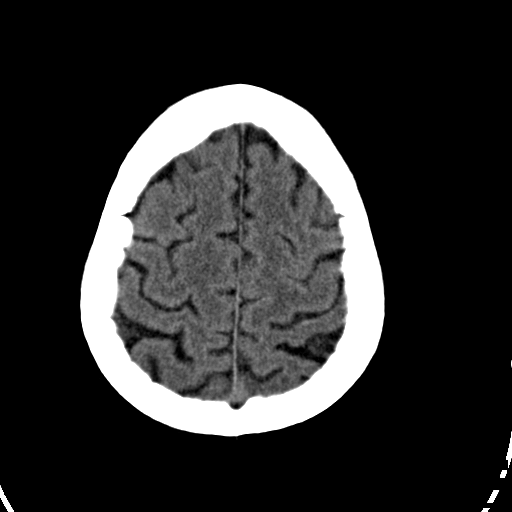
[im 23/29  bone]
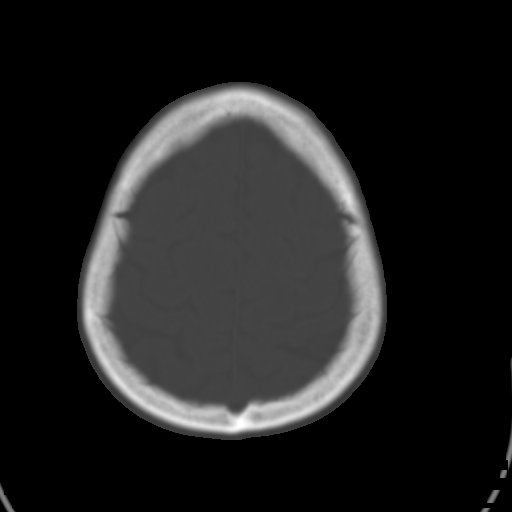
[im 24/29  brain]
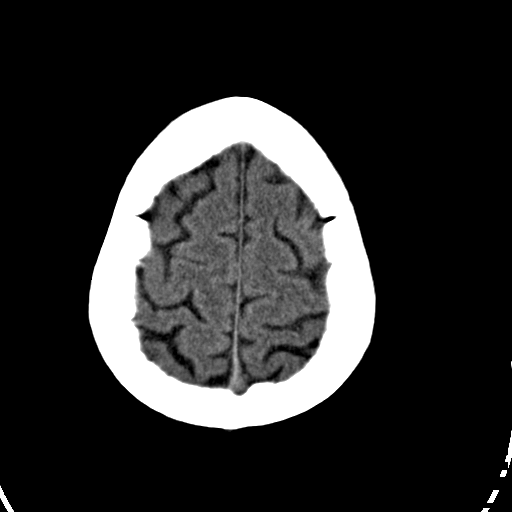
[im 26/29  brain]
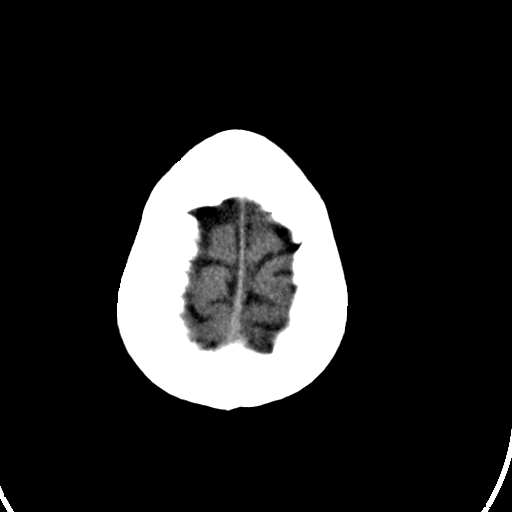
[im 28/29  brain]
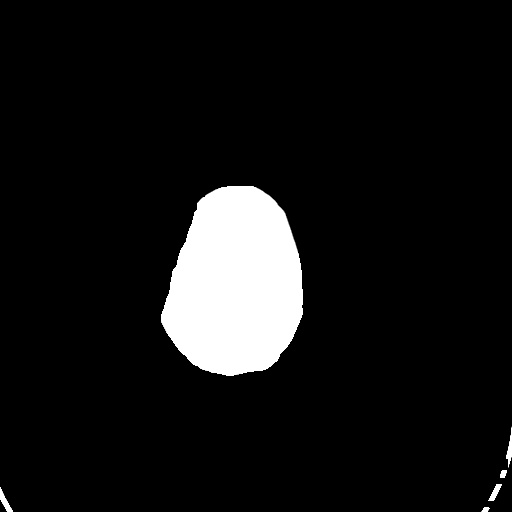

[16 of 29 positions shown; findings below may reference images not displayed]

FINDINGS: There is evidence of prior right occipital craniectomy.

There is no evidence of mass effect, midline shift, or extra-axial fluid
collections.  There is no evidence of a space-occupying lesion or
intracranial hemorrhage. There is no evidence of a cortical-based area of
acute infarction. There is generalized cerebral atrophy. There is
periventricular white matter low attenuation likely secondary to
microangiopathy.

The ventricles and sulci are appropriate for the patient's age. The basal
cisterns are patent.

Visualized portions of the orbits are unremarkable. The visualized portions
of the paranasal sinuses and mastoid air cells are unremarkable.
IMPRESSION: No acute intracranial process.

[REDACTED]

## 2014-04-13 IMAGING — US US CAROTID DUPLEX BILAT
1 series · 13 of 24 positions shown · non-contrast
Comparison: None

REASON FOR EXAM: near syncope
COMMENTS:

PROCEDURE:     US  - US CAROTID DOPPLER BILATERAL  - July 31, 2012  [DATE]
RESULT:     Indication: Syncope
TECHNIQUE: Gray-scale, color Doppler, and spectral Doppler images were
obtained of the extracranial carotid artery systems and vertebral arteries
in the neck.

[Series 1: us carotid duplex bilat · 13 of 64 slices shown]
[im 1/64]
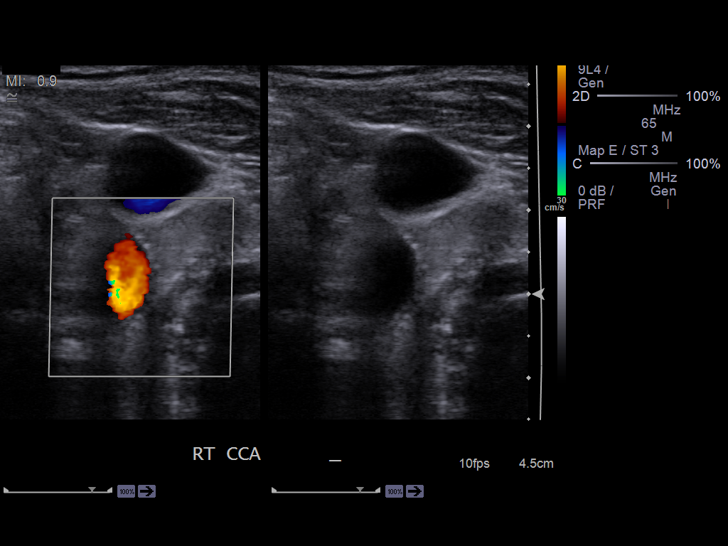
[im 6/64]
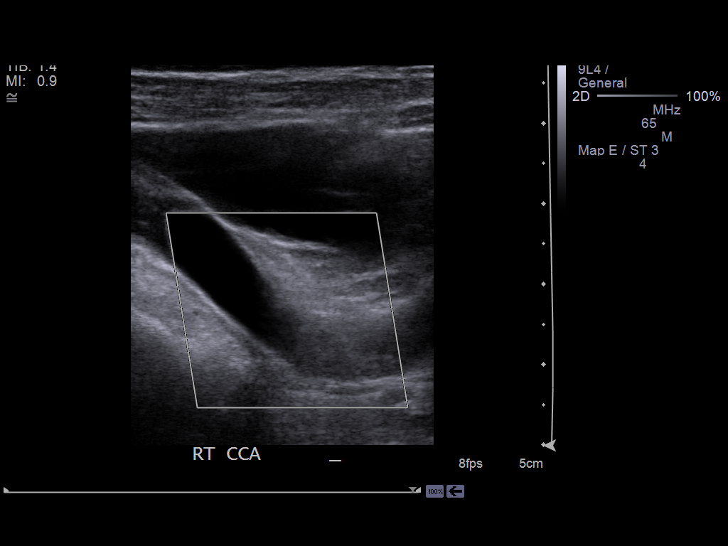
[im 11/64]
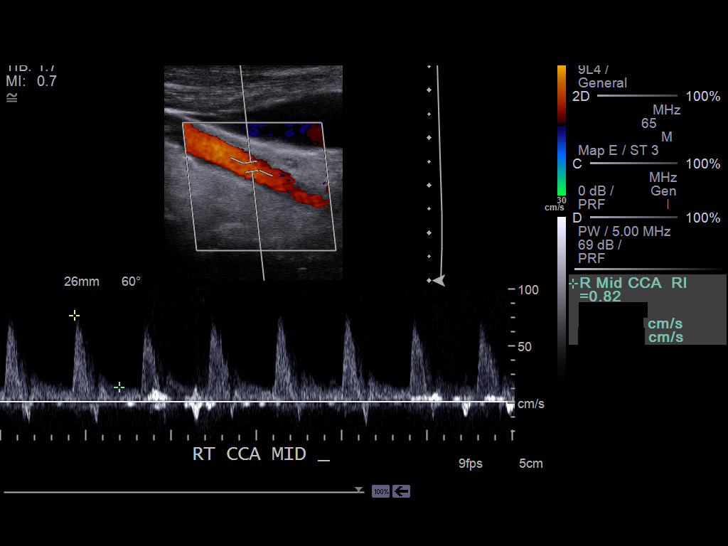
[im 17/64]
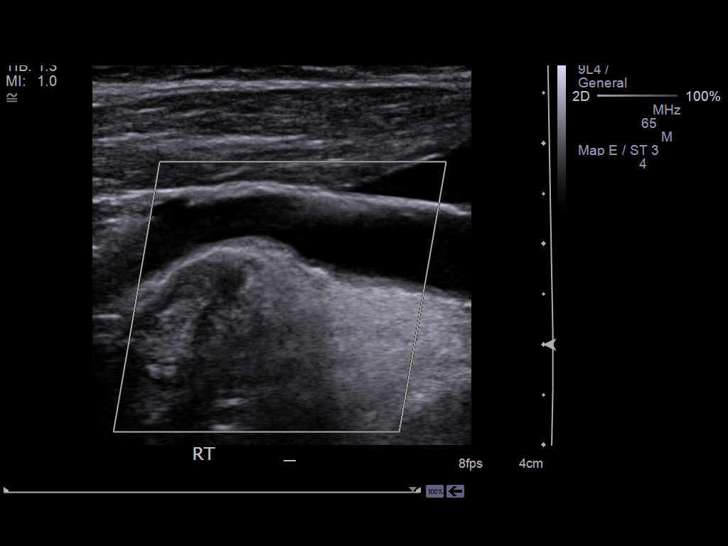
[im 22/64]
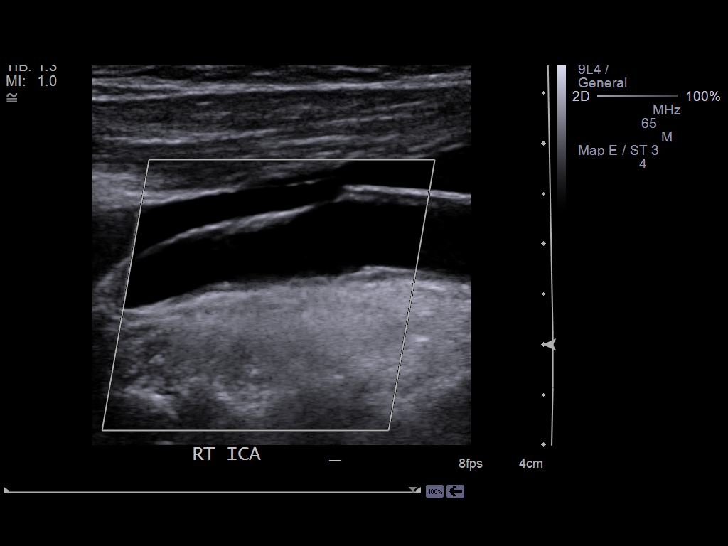
[im 28/64]
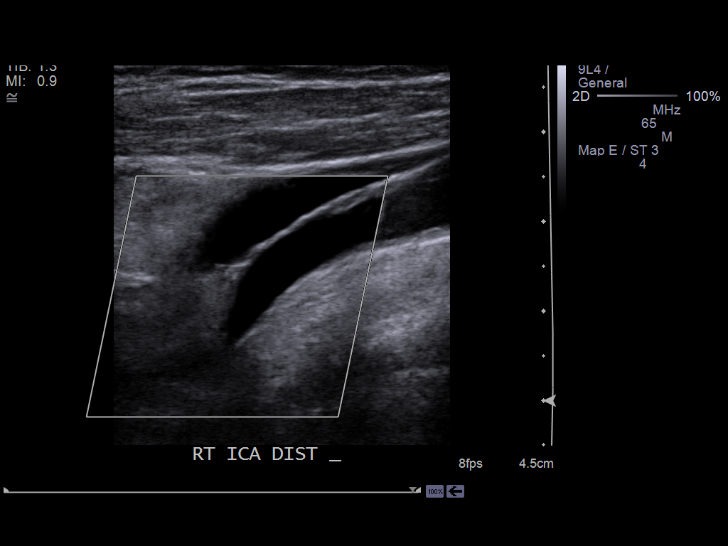
[im 33/64]
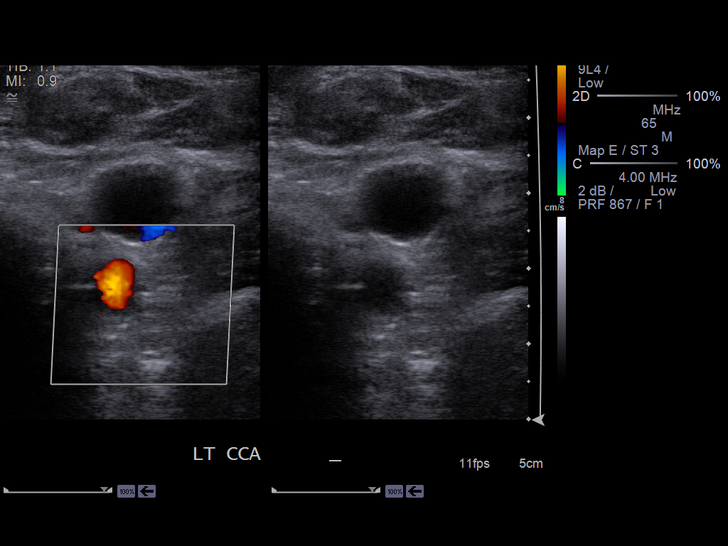
[im 36/64]
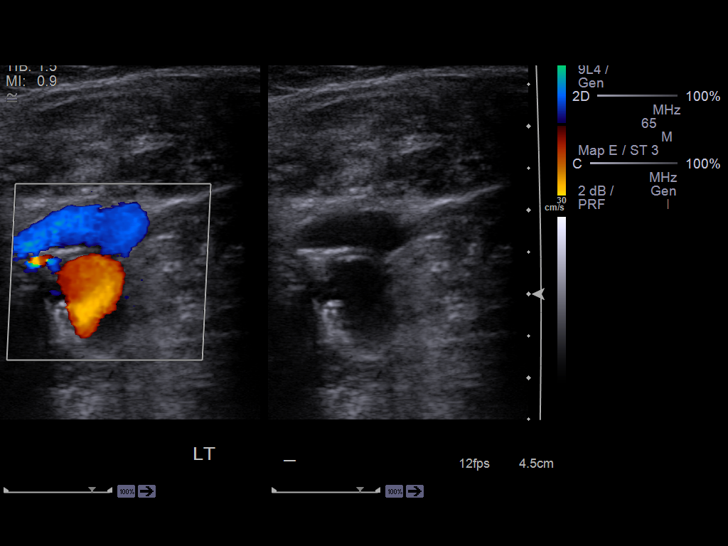
[im 42/64]
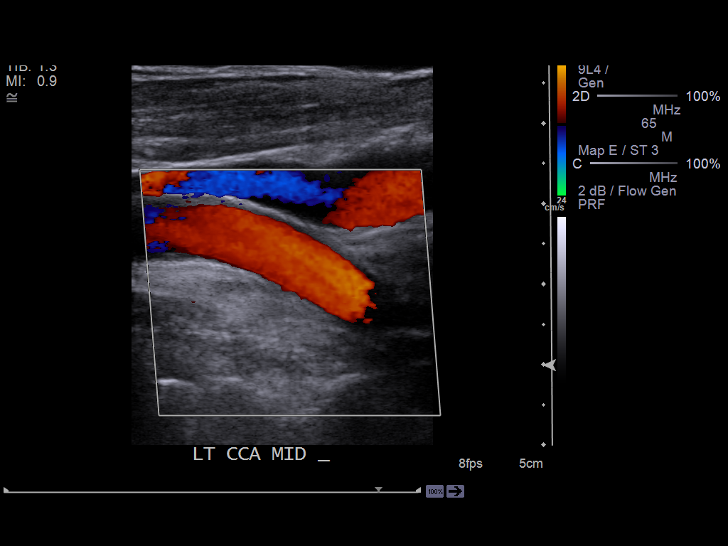
[im 47/64]
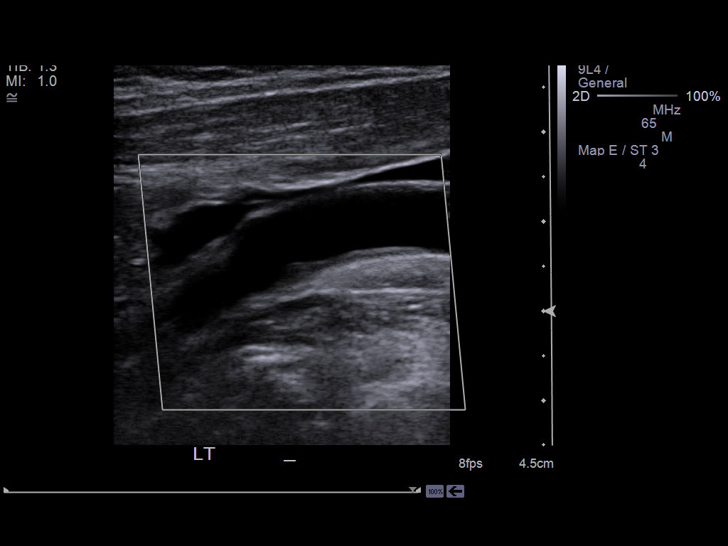
[im 53/64]
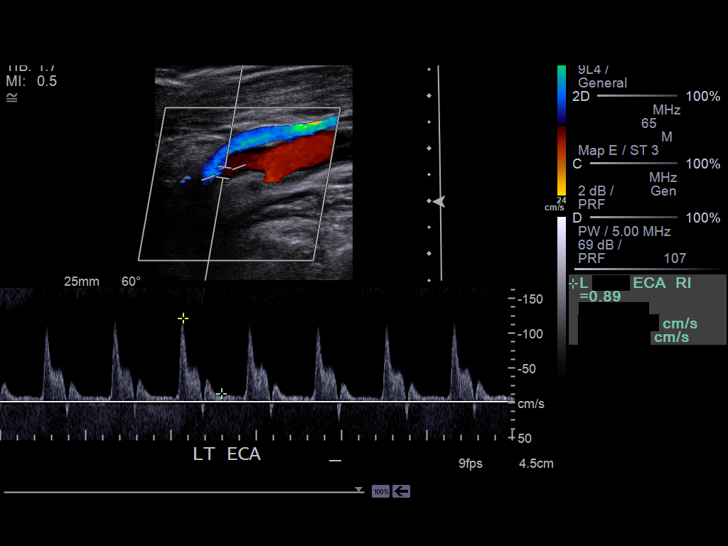
[im 58/64]
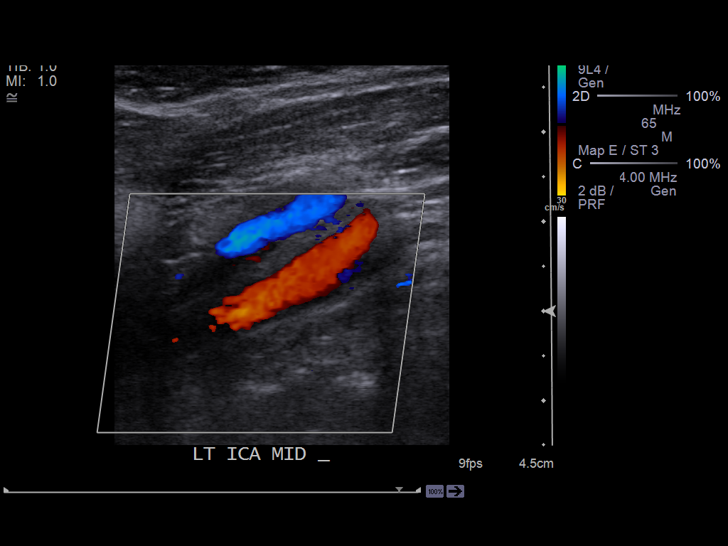
[im 64/64]
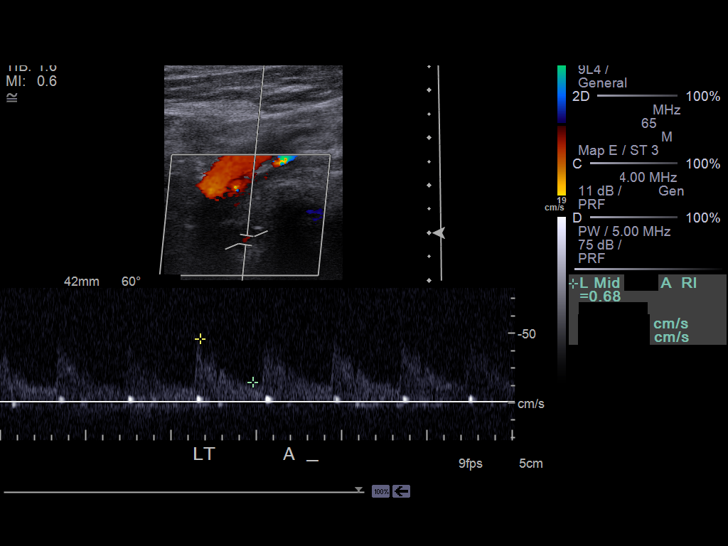

[13 of 24 positions shown; findings below may reference images not displayed]

FINDINGS: On the right, there is minimal after sclerotic plaque within the carotid
bulb. Maximum peak systolic velocity in the right CCA is 104cm/second.
Maximum peak systolic velocity in the right ICA is 99cm/second. Maximum peak
systolic velocity in the right ECA is 115cm/second. The right ICA/CCA ratio
is 7.T7Ghis corresponds to a stenosis of less than 50%. Antegrade blood flow
is documented in the right vertebral artery.

On the left, there is minimal atherosclerotic plaque within the carotid
bulb. Maximum peak systolic velocity in the left CCA is 117 cm/second.
Maximum peak systolic velocity in the left ICA is 99 cm/second. Maximum peak
systolic velocity in the left ECA is 122cm/second. The left ICA/CCA ratio is
0.72. This corresponds to a stenosis of less than 50%. Antegrade blood flow
is documented in the left vertebral artery.
IMPRESSION: 1. No hemodynamically significant carotid artery stenosis.

[REDACTED]

## 2014-04-26 ENCOUNTER — Ambulatory Visit: Payer: Self-pay | Admitting: Neurology

## 2014-05-10 ENCOUNTER — Ambulatory Visit: Payer: Self-pay | Admitting: Neurology

## 2014-05-11 DIAGNOSIS — M5116 Intervertebral disc disorders with radiculopathy, lumbar region: Secondary | ICD-10-CM | POA: Insufficient documentation

## 2014-05-16 ENCOUNTER — Ambulatory Visit: Payer: Self-pay

## 2014-08-12 IMAGING — CR DG CHEST 2V
1 series · 2 of 2 positions shown · non-contrast
Comparison: none

REASON FOR EXAM: Chest Pain
COMMENTS:

[Series 1: w chest pa · 0.14mm/px · 2 of 2 slices shown]
[im 1/2]
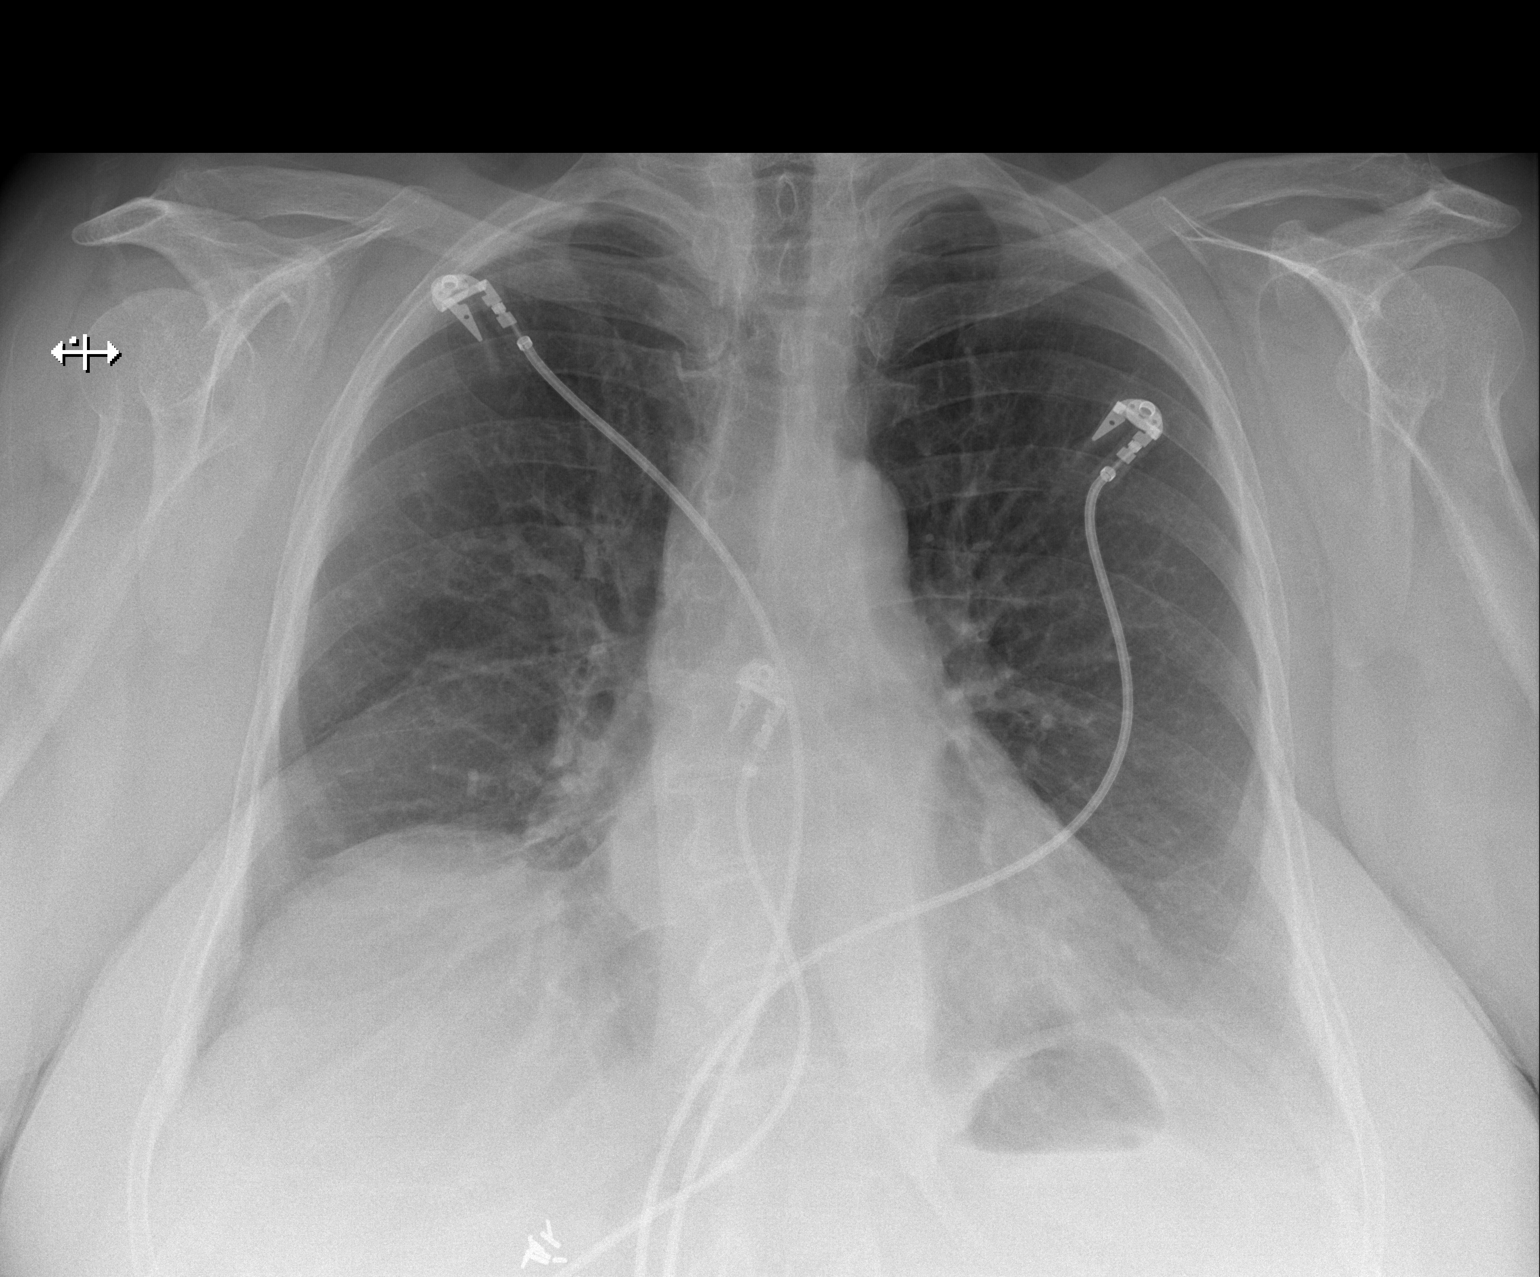
[im 2/2]
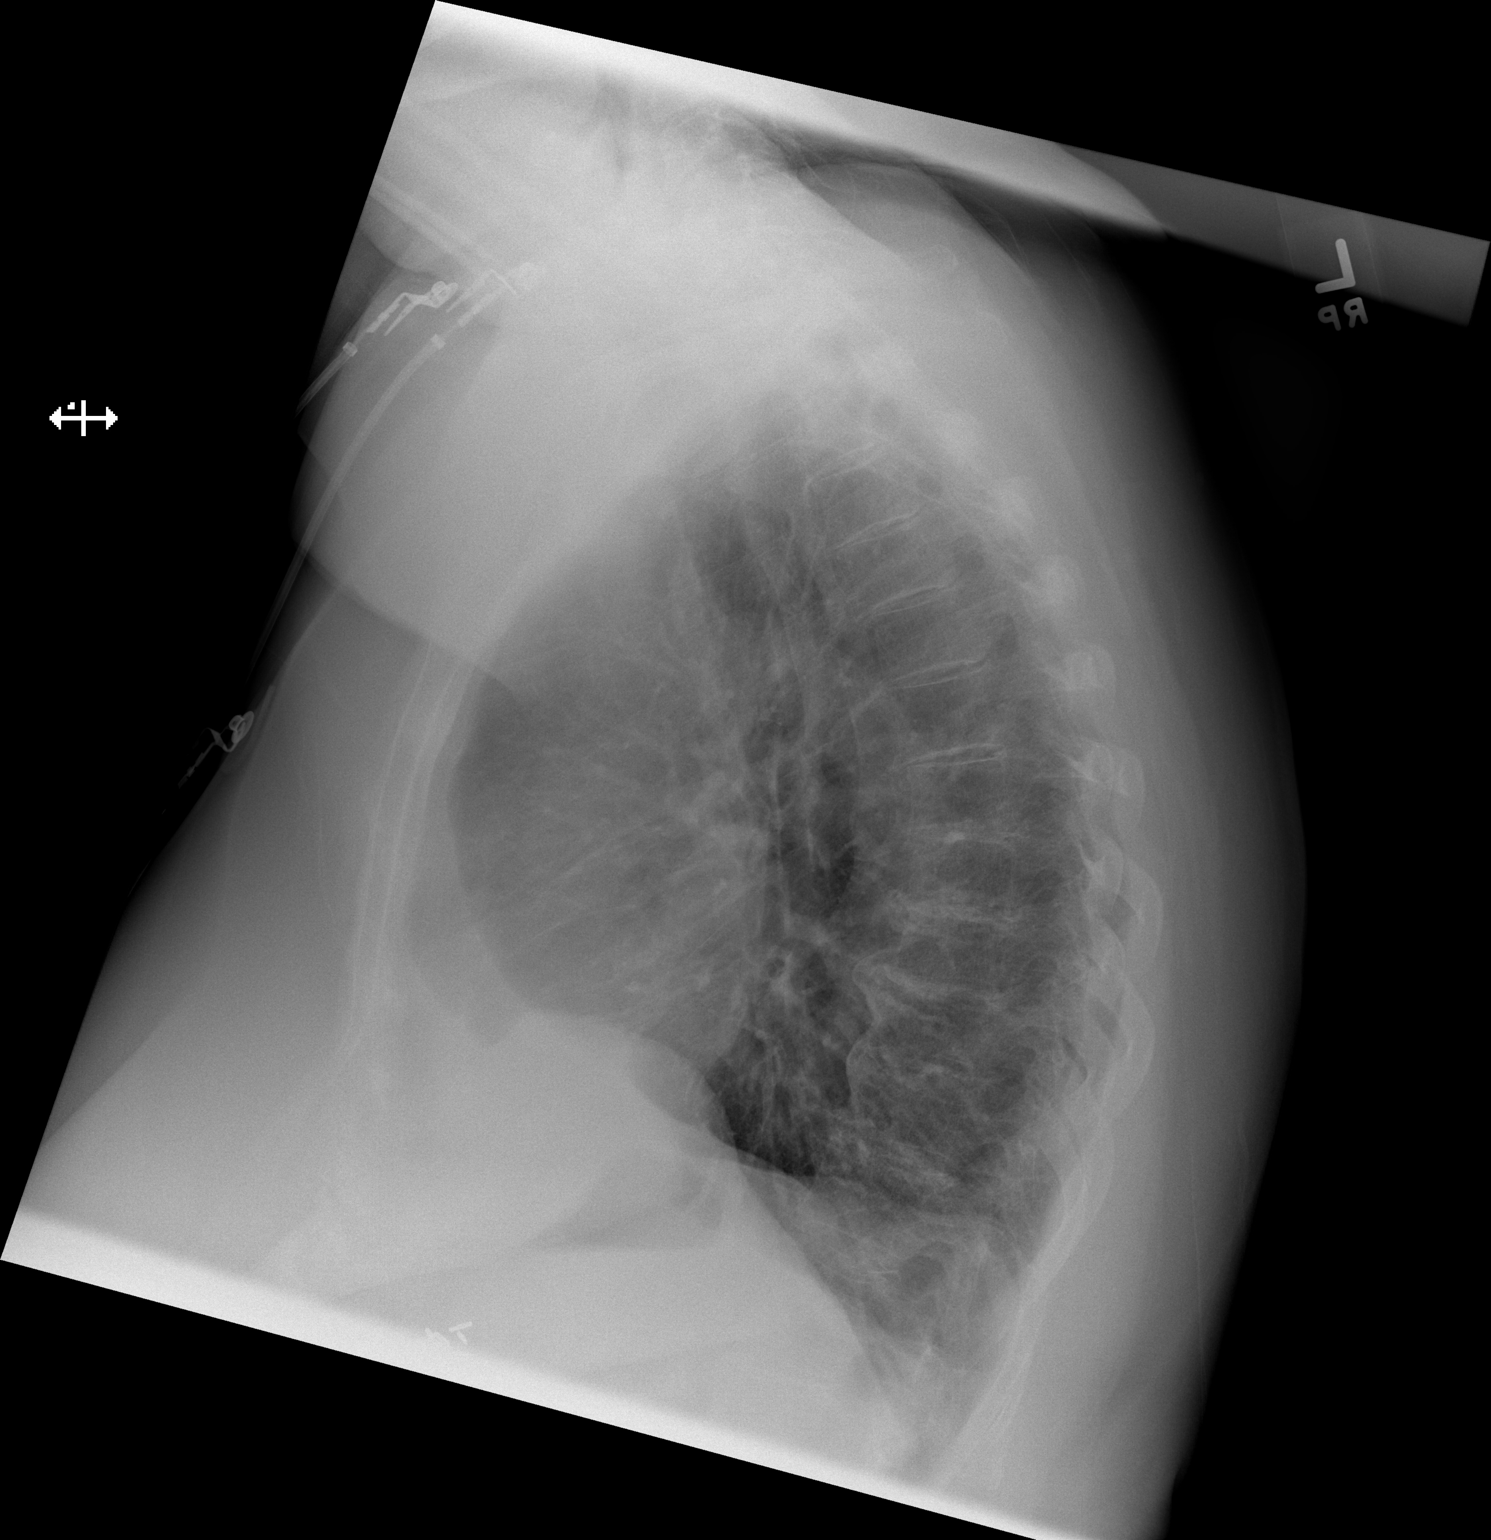

[2 of 2 positions shown; findings below may reference images not displayed]

PROCEDURE:     DXR - DXR CHEST PA (OR AP) AND LATERAL  - November 29, 2012  [DATE]

RESULT:     Comparison is made to the earlier chest x-ray 01 October, 2011.

The cardiac silhouette is normal in size. Cardiac monitoring electrodes are
present. There is no edema, infiltrate, effusion, mass or pneumothorax.
IMPRESSION: 1. No acute cardiopulmonary disease. Stable appearance.

[REDACTED]

## 2014-08-14 DIAGNOSIS — G479 Sleep disorder, unspecified: Secondary | ICD-10-CM | POA: Diagnosis not present

## 2014-08-14 DIAGNOSIS — M5481 Occipital neuralgia: Secondary | ICD-10-CM | POA: Diagnosis not present

## 2014-08-14 DIAGNOSIS — I209 Angina pectoris, unspecified: Secondary | ICD-10-CM | POA: Diagnosis not present

## 2014-08-14 DIAGNOSIS — R079 Chest pain, unspecified: Secondary | ICD-10-CM | POA: Diagnosis not present

## 2014-08-14 DIAGNOSIS — R0602 Shortness of breath: Secondary | ICD-10-CM | POA: Diagnosis not present

## 2014-08-14 DIAGNOSIS — R011 Cardiac murmur, unspecified: Secondary | ICD-10-CM | POA: Diagnosis not present

## 2014-08-14 DIAGNOSIS — R262 Difficulty in walking, not elsewhere classified: Secondary | ICD-10-CM | POA: Diagnosis not present

## 2014-08-14 DIAGNOSIS — R51 Headache: Secondary | ICD-10-CM | POA: Diagnosis not present

## 2014-08-16 DIAGNOSIS — M5416 Radiculopathy, lumbar region: Secondary | ICD-10-CM | POA: Diagnosis not present

## 2014-08-16 DIAGNOSIS — M5136 Other intervertebral disc degeneration, lumbar region: Secondary | ICD-10-CM | POA: Diagnosis not present

## 2014-08-17 DIAGNOSIS — R011 Cardiac murmur, unspecified: Secondary | ICD-10-CM | POA: Diagnosis not present

## 2014-08-21 DIAGNOSIS — R0602 Shortness of breath: Secondary | ICD-10-CM | POA: Diagnosis not present

## 2014-08-21 DIAGNOSIS — R079 Chest pain, unspecified: Secondary | ICD-10-CM | POA: Diagnosis not present

## 2014-08-23 ENCOUNTER — Encounter (HOSPITAL_BASED_OUTPATIENT_CLINIC_OR_DEPARTMENT_OTHER): Payer: Self-pay | Admitting: Orthopedic Surgery

## 2014-08-29 ENCOUNTER — Ambulatory Visit: Payer: Self-pay | Admitting: Physical Medicine and Rehabilitation

## 2014-08-29 DIAGNOSIS — M4316 Spondylolisthesis, lumbar region: Secondary | ICD-10-CM | POA: Diagnosis not present

## 2014-08-29 DIAGNOSIS — M5126 Other intervertebral disc displacement, lumbar region: Secondary | ICD-10-CM | POA: Diagnosis not present

## 2014-08-29 DIAGNOSIS — M5386 Other specified dorsopathies, lumbar region: Secondary | ICD-10-CM | POA: Diagnosis not present

## 2014-08-29 DIAGNOSIS — M0579 Rheumatoid arthritis with rheumatoid factor of multiple sites without organ or systems involvement: Secondary | ICD-10-CM | POA: Diagnosis not present

## 2014-09-19 ENCOUNTER — Emergency Department: Payer: Self-pay | Admitting: Emergency Medicine

## 2014-09-19 DIAGNOSIS — S52532A Colles' fracture of left radius, initial encounter for closed fracture: Secondary | ICD-10-CM | POA: Diagnosis not present

## 2014-09-19 DIAGNOSIS — S52592D Other fractures of lower end of left radius, subsequent encounter for closed fracture with routine healing: Secondary | ICD-10-CM | POA: Diagnosis not present

## 2014-09-19 DIAGNOSIS — S52562A Barton's fracture of left radius, initial encounter for closed fracture: Secondary | ICD-10-CM | POA: Diagnosis not present

## 2014-09-19 DIAGNOSIS — E119 Type 2 diabetes mellitus without complications: Secondary | ICD-10-CM | POA: Diagnosis not present

## 2014-09-19 DIAGNOSIS — S52569A Barton's fracture of unspecified radius, initial encounter for closed fracture: Secondary | ICD-10-CM | POA: Insufficient documentation

## 2014-09-19 DIAGNOSIS — I10 Essential (primary) hypertension: Secondary | ICD-10-CM | POA: Diagnosis not present

## 2014-09-19 DIAGNOSIS — S52592A Other fractures of lower end of left radius, initial encounter for closed fracture: Secondary | ICD-10-CM | POA: Diagnosis not present

## 2014-10-05 DIAGNOSIS — M069 Rheumatoid arthritis, unspecified: Secondary | ICD-10-CM | POA: Diagnosis not present

## 2014-10-05 DIAGNOSIS — J449 Chronic obstructive pulmonary disease, unspecified: Secondary | ICD-10-CM | POA: Diagnosis not present

## 2014-10-05 DIAGNOSIS — I10 Essential (primary) hypertension: Secondary | ICD-10-CM | POA: Diagnosis not present

## 2014-10-05 DIAGNOSIS — M25532 Pain in left wrist: Secondary | ICD-10-CM | POA: Diagnosis not present

## 2014-10-05 DIAGNOSIS — R0602 Shortness of breath: Secondary | ICD-10-CM | POA: Diagnosis not present

## 2014-10-05 DIAGNOSIS — S52562D Barton's fracture of left radius, subsequent encounter for closed fracture with routine healing: Secondary | ICD-10-CM | POA: Diagnosis not present

## 2014-10-05 DIAGNOSIS — M5416 Radiculopathy, lumbar region: Secondary | ICD-10-CM | POA: Diagnosis not present

## 2014-10-09 DIAGNOSIS — M5126 Other intervertebral disc displacement, lumbar region: Secondary | ICD-10-CM | POA: Diagnosis not present

## 2014-10-09 DIAGNOSIS — M5136 Other intervertebral disc degeneration, lumbar region: Secondary | ICD-10-CM | POA: Diagnosis not present

## 2014-10-09 DIAGNOSIS — M545 Low back pain: Secondary | ICD-10-CM | POA: Diagnosis not present

## 2014-10-09 DIAGNOSIS — M4726 Other spondylosis with radiculopathy, lumbar region: Secondary | ICD-10-CM | POA: Diagnosis not present

## 2014-10-09 DIAGNOSIS — M546 Pain in thoracic spine: Secondary | ICD-10-CM | POA: Diagnosis not present

## 2014-10-19 DIAGNOSIS — S52542P Smith's fracture of left radius, subsequent encounter for closed fracture with malunion: Secondary | ICD-10-CM | POA: Diagnosis not present

## 2014-10-22 ENCOUNTER — Ambulatory Visit: Payer: Self-pay | Admitting: Specialist

## 2014-10-22 DIAGNOSIS — Z888 Allergy status to other drugs, medicaments and biological substances status: Secondary | ICD-10-CM | POA: Diagnosis not present

## 2014-10-22 DIAGNOSIS — Z79891 Long term (current) use of opiate analgesic: Secondary | ICD-10-CM | POA: Diagnosis not present

## 2014-10-22 DIAGNOSIS — Z91041 Radiographic dye allergy status: Secondary | ICD-10-CM | POA: Diagnosis not present

## 2014-10-22 DIAGNOSIS — S52542A Smith's fracture of left radius, initial encounter for closed fracture: Secondary | ICD-10-CM | POA: Diagnosis not present

## 2014-10-22 DIAGNOSIS — J449 Chronic obstructive pulmonary disease, unspecified: Secondary | ICD-10-CM | POA: Diagnosis not present

## 2014-10-22 DIAGNOSIS — Z9889 Other specified postprocedural states: Secondary | ICD-10-CM | POA: Diagnosis not present

## 2014-10-22 DIAGNOSIS — R002 Palpitations: Secondary | ICD-10-CM | POA: Diagnosis not present

## 2014-10-22 DIAGNOSIS — J45909 Unspecified asthma, uncomplicated: Secondary | ICD-10-CM | POA: Diagnosis not present

## 2014-10-22 DIAGNOSIS — K219 Gastro-esophageal reflux disease without esophagitis: Secondary | ICD-10-CM | POA: Diagnosis not present

## 2014-10-22 DIAGNOSIS — R0601 Orthopnea: Secondary | ICD-10-CM | POA: Diagnosis not present

## 2014-10-22 DIAGNOSIS — E118 Type 2 diabetes mellitus with unspecified complications: Secondary | ICD-10-CM | POA: Diagnosis not present

## 2014-10-22 DIAGNOSIS — Z96653 Presence of artificial knee joint, bilateral: Secondary | ICD-10-CM | POA: Diagnosis not present

## 2014-10-22 DIAGNOSIS — Z7952 Long term (current) use of systemic steroids: Secondary | ICD-10-CM | POA: Diagnosis not present

## 2014-10-22 DIAGNOSIS — I10 Essential (primary) hypertension: Secondary | ICD-10-CM | POA: Diagnosis not present

## 2014-10-22 DIAGNOSIS — S52532A Colles' fracture of left radius, initial encounter for closed fracture: Secondary | ICD-10-CM | POA: Diagnosis not present

## 2014-10-22 DIAGNOSIS — R011 Cardiac murmur, unspecified: Secondary | ICD-10-CM | POA: Diagnosis not present

## 2014-10-26 DIAGNOSIS — S52542P Smith's fracture of left radius, subsequent encounter for closed fracture with malunion: Secondary | ICD-10-CM | POA: Diagnosis not present

## 2014-11-09 DIAGNOSIS — S52542P Smith's fracture of left radius, subsequent encounter for closed fracture with malunion: Secondary | ICD-10-CM | POA: Diagnosis not present

## 2014-11-16 DIAGNOSIS — F319 Bipolar disorder, unspecified: Secondary | ICD-10-CM | POA: Insufficient documentation

## 2014-11-16 DIAGNOSIS — Z8719 Personal history of other diseases of the digestive system: Secondary | ICD-10-CM | POA: Insufficient documentation

## 2014-11-16 DIAGNOSIS — Z8711 Personal history of peptic ulcer disease: Secondary | ICD-10-CM | POA: Insufficient documentation

## 2014-11-16 DIAGNOSIS — F4310 Post-traumatic stress disorder, unspecified: Secondary | ICD-10-CM | POA: Insufficient documentation

## 2014-11-23 DIAGNOSIS — S52542S Smith's fracture of left radius, sequela: Secondary | ICD-10-CM | POA: Diagnosis not present

## 2014-11-23 DIAGNOSIS — S52542P Smith's fracture of left radius, subsequent encounter for closed fracture with malunion: Secondary | ICD-10-CM | POA: Diagnosis not present

## 2014-11-27 DIAGNOSIS — D519 Vitamin B12 deficiency anemia, unspecified: Secondary | ICD-10-CM | POA: Diagnosis not present

## 2014-11-27 DIAGNOSIS — R110 Nausea: Secondary | ICD-10-CM | POA: Diagnosis not present

## 2014-11-27 DIAGNOSIS — H60513 Acute actinic otitis externa, bilateral: Secondary | ICD-10-CM | POA: Diagnosis not present

## 2014-11-27 DIAGNOSIS — J019 Acute sinusitis, unspecified: Secondary | ICD-10-CM | POA: Diagnosis not present

## 2014-11-27 DIAGNOSIS — J300 Vasomotor rhinitis: Secondary | ICD-10-CM | POA: Diagnosis not present

## 2014-11-27 DIAGNOSIS — E119 Type 2 diabetes mellitus without complications: Secondary | ICD-10-CM | POA: Diagnosis not present

## 2014-11-27 NOTE — Discharge Summary (Signed)
PATIENT NAME:  Meghan [name], Meghan [name] MR#:  [id number] DATE OF BIRTH:  1945-11-22  DATE OF ADMISSION:  07/31/2012 DATE OF DISCHARGE:  08/01/2012  PRIMARY CARE PHYSICIAN:  Lavera Guise, MD  CHIEF COMPLAINT:  Chest pain, near syncope.   DISCHARGE DIAGNOSES:   1.  Chest pain, likely not cardiac.  2.  Mild dehydration with orthostatic dizziness.  3.  Hypertension.  4.  Hyperlipidemia.  5.  Coronary artery disease status post cardiac catheterization in 2006.  6.  Diabetes.  7.  Chronic obstructive pulmonary disease.   DISCHARGE MEDICATIONS:   1.  Tramadol 50 mg every 8 hours as needed for pain.  2.  Prednisone 5 mg 1 tab in the morning and at bedtime.  3.  Singulair 10 mg 1 tab once a day.  4.  Ondansetron 8 mg 1 tab once a day as needed.  5.  Clonidine 0.1 mg 2 times a day.  6.  Nexium 40 mg daily.  7.  Xeljanz 5 mg 1 tab 2 times a day.  8.  Zolpidem 10 mg 1 tab once a day at bedtime for sleep aid.  9.  Cetirizine 10 mg daily.  10.  Lamotrigine 25 mg 4 tabs once a day at bedtime.  11.  Onglyza 5 mg 1 tab once a day.  12.  Prednisone 1 mg 4 tabs once a day.  13.  Alprazolam 0.5 mg 1 tab once a day.  14.  Albuterol/ipratropium 2 puffs inhaled as needed for shortness of breath.  15.  Aspirin 81 mg daily.   DIET:  Low sodium, low fat, low cholesterol, ADA diet.   ACTIVITY:  As tolerated.   FOLLOWUP:  Please follow with your primary care physician within 1 to 2 days weeks.   DISPOSITION:  Home.   HISTORY OF PRESENT ILLNESS:  For full details of history and physical, please see the dictation by Dr. Margaretmary Eddy on December 22nd, but this is a 69 year old female with COPD, diabetes, hypertension, degenerative joint disease, rheumatoid arthritis, osteoarthritis and anxiety who presented with the above chief complaint. The patient had some chest pressure across the chest that felt like an elephant was sitting on her chest associated with nausea and dizziness without any loss of consciousness. The chest  pain went away and the patient presented to the hospital and was admitted to the hospitalist service for further evaluation and management.   SIGNIFICANT LABORATORY, DIAGNOSTIC AND RADIOLOGICAL DATA:  Hemoglobin A1c was 6.9. Initial BUN and creatinine were within normal limits. Glucose was 116. Troponin was negative x 3. CK-MB not elevated x3. Initial CBC was within normal limits. Echocardiogram on December 22nd showed normal EF, normal systolic function, no thrombus, wall motion normal. A stress test done on December 23rd showed negative scan. CT of the head done on admission for near syncope showed no acute intracranial process. Ultrasound of the carotids showed no hemodynamically significant carotid artery stenosis.   HOSPITAL COURSE:  The patient was admitted to the hospitalist service and started on ACS protocol given history of CAD. The patient did not have any further chest pains. She underwent a stress test which was negative. Of note, the patient also had another stress test about a year ago which was within normal limits per her; that stress test was done at Dr. Etta Quill office. An echocardiogram was done showing normal wall motion and normal EF. The chest pain episode resolved. This episode was likely not cardiac related at this point. The near syncope was likely  in the setting of orthostatic dizziness. The patient had a significant drop in systolic blood pressure when she stood up; this was close to 17 points. She was started on some gentle IV fluids and orthostatic vitals improved significantly. The patient did have mild dehydration. Her aspirin, statin, beta-blocker were resumed. She was discharged with stable vitals with outpatient followup recommendation.   TOTAL TIME SPENT:  Was 35 minutes.   ____________________________ Vivien Presto, MD sa:si D: 08/02/2012 17:36:00 ET T: 08/02/2012 20:07:43 ET JOB#: [id number]  cc: Vivien Presto, MD, <Dictator> Lavera Guise, MD  Karel Jarvis  [facility] MD ELECTRONICALLY SIGNED 08/09/2012 14:13

## 2014-11-27 NOTE — H&P (Signed)
PATIENT NAME:  Meghan [name], Meghan [name] MR#:  [id number] DATE OF BIRTH:  04-Nov-1945  DATE OF ADMISSION:  07/31/2012  PRIMARY CARE PHYSICIAN: Dr. Clayborn Bigness   REFERRING PHYSICIAN: Dr. Conni Slipper   CHIEF COMPLAINT: Chest pain and near-syncope.   HISTORY OF PRESENT ILLNESS: The patient is a 69 year old Caucasian female with a past medical history of coronary artery disease and had a cardiac cath done in the year 2006 which showed 20% lesion in the LAD, COPD, chronic diabetes mellitus, hypertension, degenerative joint disease, rheumatoid arthritis and osteoarthritis and anxiety, is presenting to the ER with a chief complaint of chest pain. The patient was in her usual state of health until this afternoon, and while watching TV suddenly she started developing chest pressure across the chest. She felt like an elephant was sitting on her chest, associated with nausea and dizziness, but denies any loss of consciousness, no radiation of the pain. She felt like her heartbeat was slightly irregular at that time. This episode lasted for 30 minutes, and eventually the pain faded away. The patient was brought into the ER and given 1 mg/kg of Lovenox, and a CT head was done which has revealed no acute findings. The hospitalist team is called to admit the patient to rule out acute MI and because for further evaluation of dizziness.   PAST MEDICAL HISTORY: Hypertension, hyperlipidemia, coronary artery disease and cardiac cath in the year 2006 which has showed 20% lesion in the LAD, non-insulin-dependent diabetes mellitus, chronic obstructive pulmonary disease and hyperlipidemia.   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   ALLERGIES: THE PATIENT IS ALLERGIC TO NAPROSYN AND BETADINE.   PSYCHOSOCIAL HISTORY:  She lives at home with her daughter and son-in-law.  She denies any alcohol, tobacco or illicit drug usage.   FAMILY HISTORY: Mother had a history of coronary artery disease.  HOME MEDICATIONS: Zolpidem 10 mg p.o. once a day, Singulair 10 mg p.o. once daily, prednisone 5 mg p.o. once daily, , Onglyza 5 mg once daily, ondansetron 8 mg, 1 tablet p.o. once a day, Albuterol 2 puff inhalations once a day, Synthroid 112 mcg p.o. once daily.   REVIEW OF SYSTEMS:  CONSTITUTIONAL: Denies any fever, fatigue, weakness, pain, weight loss or weight gain.  EYES: Denies any blurry vision, redness, glaucoma, cataracts.  ENT: Denies any epistaxis, ear pain, ear discharge, postnasal drip, or difficulty in swallowing.  RESPIRATORY: Denies cough, wheezing, hemoptysis, COPD, asthma, painful respiration.  CARDIOVASCULAR: Chest pain, positive palpitations, positive syncope  GASTROINTESTINAL: Denies any diarrhea, abdominal pain. Positive nausea but denies any vomiting. Denies any, GERD. Denies rectal bleeding, constipation.  GENITOURINARY: Denies any dysuria, hematuria, or renal calculi.  ENDOCRINE: Denies any polyuria, polyphagia, polydipsia.  Has had history of thyroid problems, hypothyroidism.  HEMATOLOGIC/LYMPHATIC: Denies anemia, easy bruising, bleeding, swollen glands.  INTEGUMENTARY: Denies acne, rash, lesions.  MUSCULOSKELETAL: Denies any pain in the neck, back, shoulder, knee pain, limited activity. Denies any swelling. Denies gout.  NEUROLOGIC: Denies any numbness, weakness or dysarthria, epilepsy, tremor, vertigo, ataxia, CVA, TIA, seizure, memory loss.  PSYCHIATRIC: Denies any insomnia, ADD, OCD, bipolar disorder, depression.   PHYSICAL EXAMINATION:  VITAL SIGNS: Temperature 98.9, pulse 75, respiratory rate 18, blood pressure 119/54.  GENERAL APPEARANCE: Not in acute distress, answering questions appropriately. Moderately built and moderately nourished.  HEENT: Normocephalic, atraumatic. Pupils are  equally reacting to light and accommodation. Extraocular movements are intact. No nasal congestion, no sinus tenderness, no postnasal drip. No ear discharge noted. Tympanic membranes are intact.  NECK: Supple. No JVD noted. No carotid bruits. No thyromegaly.  LUNGS: Clear to auscultation bilaterally. No accessory muscle usage. No crackles. No wheezing. Reproducible anterior chest wall tenderness is present in the midsternal area across the left upper and right upper anterior chest wall.  CARDIAC: S1, S2 normal. Regular rate and rhythm. No murmurs.   GASTROINTESTINAL: Soft. Bowel sounds are positive in all four quadrants. Nontender, nondistended. No masses felt.  NEUROLOGICAL:  Awake, alert and oriented x 3. Cranial nerves II through XII are grossly intact. Motor and sensory is intact. Reflexes are 2+  EXTREMITIES: No edema. No cyanosis. No clubbing.  SKIN: Warm to touch.  No lesions. No rashes.  EXTREMITIES: No edema. No cyanosis. No clubbing.  MUSCULOSKELETAL: No joint effusion, erythema, or tenderness.   LABORATORY, DIAGNOSTIC AND RADIOLOGICAL DATA:  CT head: No acute findings. A 12-lead EKG is normal with  normal sinus rhythm at 81 beats per minute. As reported by the patient, a stress test was done approximately 1 year ago by Dr. Clayborn Bigness, her cardiologist, which was normal.   WBC 9.2, hemoglobin is 14.1, hematocrit 40.2, platelet count is 248,000, MCV 85. Glucose 116, sodium 140, potassium is 3.5, creatinine is 0.91, BUN is 14, anion gap is 8, troponin T 0.02., CK total 43, CK-MB 1.3.   ASSESSMENT AND PLAN: The patient is a 69 year old female presenting to the ER with a chief complaint of midsternal chest pain which was heavy in nature, associated with near syncope. She will be admitted with the following assessment and plan.   1. Chest pain, probably atypical but rule out acute coronary syndrome: Cycle cardiac biomarkers x 3 every 6 hours. We will start her on ACS protocol with oxygen,  nitroglycerin, aspirin, beta blocker and statin. Cardiology consult is placed to Dr. Clayborn Bigness. I will try to get her recent stress test results from Dr. Etta Quill office. Cycle cardiac biomarkers. I will get a 2-D echocardiogram.  2. Near-syncope: CT of the head is normal. We will get a complete workup with carotid Dopplers and 2-D echocardiogram. We will get neuro checks. We will get orthostatics as well.  3. Hypertension: The patient will be on a low-dose beta blocker.  4. Diabetes mellitus: We will give insulin sliding scale for now.  5. Past medical history of coronary artery disease: I will cycle cardiac biomarkers.  6. Hyperlipidemia: Check fasting lipid panel. Continue statin.  7. COPD:  We will provide her DuoNebs treatments on an as needed basis.  8. I will provide her GI and DVT prophylaxis.  CODE STATUS: The patient is a FULL CODE.    The plan of care was discussed in detail with the patient. She verbalized understanding of the plan.   TOTAL TIME SPENT ON THE ADMISSION: 60 minutes. ____________________________ Nicholes Mango, MD ag:cb D: 07/31/2012 02:47:00 ET T: 07/31/2012 13:43:15 ET JOB#: [id number]  cc: Dwayne D. Clayborn Bigness, MD Lavera Guise, MD Nicholes Mango, MD, <Dictator> Nicholes Mango MD ELECTRONICALLY SIGNED 08/03/2012 3:44

## 2014-11-30 NOTE — Consult Note (Signed)
PATIENT NAME:  Meghan [name], Meghan [name] MR#:  [id number] DATE OF BIRTH:  1945/09/15  DATE OF CONSULTATION:  07/31/2012  REFERRING PHYSICIAN:   CONSULTING PHYSICIAN:  Avynn Klassen D. Tavion Senkbeil, MD  INDICATION:  Chest pain and angina.   PRIMARY CARE PHYSICIAN:  Clayborn Bigness, MD  HISTORY OF PRESENT ILLNESS:  Ms. Churchman is a 69 year old white female with a known history of chest pain, coronary artery disease, hypertension, hyperlipidemia, diabetes who presents with chest pain and angina. The patient complains of some shortness of breath, headache, weakness, fatigue, some mild dizziness so she was admitted for further evaluation and care.   REVIEW OF SYSTEMS:  No blackout spells, no syncope. No nausea or vomiting. No fever. No chills. No sweats. No weight loss. No weight gain, hemoptysis, hematemesis. Denies bright red blood per rectum.   PAST MEDICAL HISTORY:  Coronary artery disease,  syncope, rheumatoid arthritis, hypertension, hyperlipidemia, diabetes, .   FAMILY HISTORY:  Noncontributory.   SOCIAL HISTORY:  Retired. No smoking. No alcohol consumption.   MEDICATIONS:  Metoprolol twice a day, aspirin 81 mg a day, statin and medicines for arthritis.   ALLERGIES:  None.   PHYSICAL EXAMINATION: VITAL SIGNS: Blood pressure 110/68, pulse 72 and irregular, respiratory rate 18, afebrile.  HEENT: Normocephalic, atraumatic. Pupils are equal and reactive to light.  NECK: Supple. No JVD, bruits or adenopathy.  LUNGS: Clear to auscultation and percussion. No significant wheeze, rhonchi or rales.  ABDOMEN: Benign.  EXTREMITIES: Within normal limits except for arthritic changes.  NEUROLOGIC: Grossly intact.  SKIN: Normal.   DIAGNOSTIC DATA:  CK was 32, MB 1, troponin less than 0.02. Chest x-ray was negative. EKG: Normal sinus rhythm, nonspecific changes. CT of the head was unremarkable.   ASSESSMENT:  Chest pain, angina, hypertension, near syncope, coronary artery disease, hyperlipidemia, diabetes, arthritis.    PLAN:  Agree with admit. Rule out for myocardial infarction. Continue current medications. Follow cardiac enzymes. Followup EKG. Continue telemetry. Continue diabetes management and control, followup sugars. Continue hypertension control with current medications. Echocardiogram may be helpful. Continue statin therapy. We will probably proceed with a functional study to evaluate for ischemia. We will treat the patient medically unless symptoms persist, worsen, or recur.    ____________________________ Loran Senters. Clayborn Bigness, MD ddc:si D: 08/03/2012 23:00:00 ET T: 08/03/2012 23:33:01 ET JOB#: [id number]  cc: Shirley Bolle D. Clayborn Bigness, MD, <Dictator> Yolonda Kida MD ELECTRONICALLY SIGNED 08/15/2012 14:28

## 2014-11-30 NOTE — Consult Note (Signed)
Chief Complaint:   Subjective/Chief Complaint Pt states improved cp.Mild sob. She is slightly better today.   VITAL SIGNS/ANCILLARY NOTES: **Vital Signs.:   23-Dec-13 08:00   Vital Signs Type Routine   Temperature Temperature (F) 98.7   Celsius 37   Temperature Source oral   Pulse Pulse 78   Respirations Respirations 18   Systolic BP Systolic BP 967   Diastolic BP (mmHg) Diastolic BP (mmHg) 85   Mean BP 105   Pulse Ox % Pulse Ox % 97   Pulse Ox Activity Level  At rest   Oxygen Delivery Room Air/ 21 %  *Intake and Output.:   Daily 23-Dec-13 07:00   Grand Totals Intake:  1380 Output:  975    Net:  405 24 Hr.:  405   Oral Intake      In:  480   IV (Primary)      In:  900   Urine ml     Out:  975   Length of Stay Totals Intake:  1380 Output:  975    Net:  405   Brief Assessment:   Cardiac Regular  murmur present  -- JVD    Respiratory normal resp effort  clear BS  no use of accessory muscles    Gastrointestinal Normal    Gastrointestinal details normal Soft  Nontender  Nondistended  No masses palpable  Bowel sounds normal   Lab Results: Cardiology:  23-Dec-13 09:20    Protocol BRUCE   Max Work Load 61   Total Exercise Time 591   Max Diastolic BP 79   Max Heart Rate 127   Max Predicted Heart Rate 154   Reason For Termination Target Heart Rate Achieved   ECG interpretation Confirmed by OVERREAD, NOT ([id number]), editor PEARSON, BARBARA ([id number]) on 08/01/2012 2:12:43 PM  Routine Chem:  23-Dec-13 04:55    Cholesterol, Serum 148   Triglycerides, Serum 103   HDL (INHOUSE) 47   VLDL Cholesterol Calculated 21   LDL Cholesterol Calculated 80 (Result(s) reported on 01 Aug 2012 at 05:43AM.)   Glucose, Serum  109   BUN 15   Creatinine (comp) 0.63   Sodium, Serum 143   Potassium, Serum 3.6   Chloride, Serum  111   CO2, Serum 26   Calcium (Total), Serum  7.9   Anion Gap  6   Osmolality (calc) 286   eGFR (African American) >60   eGFR (Non-African American) >60 (eGFR values  <90m/min/1.73 m2 may be an indication of chronic kidney disease (CKD). Calculated eGFR is useful in patients with stable renal function. The eGFR calculation will not be reliable in acutely ill patients when serum creatinine is changing rapidly. It is not useful in  patients on dialysis. The eGFR calculation may not be applicable to patients at the low and high extremes of body sizes, pregnant women, and vegetarians.)   Hemoglobin A1c ([facility])  6.9 (The American Diabetes Association recommends that a primary goal of therapy should be <7% and that physicians should reevaluate the treatment regimen in patients with HbA1c values consistently >8%.)  Routine Hem:  23-Dec-13 04:55    WBC (CBC) 7.6   RBC (CBC) 4.51   Hemoglobin (CBC) 13.1   Hematocrit (CBC) 38.6   Platelet Count (CBC) 205   MCV 86   MCH 29.1   MCHC 34.0   RDW 13.7   Neutrophil % 55.1   Lymphocyte % 34.0   Monocyte % 8.0   Eosinophil % 2.3   Basophil %  0.6   Neutrophil # 4.2   Lymphocyte # 2.6   Monocyte # 0.6   Eosinophil # 0.2   Basophil # 0.0 (Result(s) reported on 01 Aug 2012 at 05:44AM.)   Assessment/Plan:  Invasive Device Daily Assessment of Necessity:   Does the patient currently have any of the following indwelling devices? none   Assessment/Plan:   Assessment IMP Chest Pain CAD HTN SOB Hyperchol Near Syncope DM RA Angina .    Plan PLAN Agree with ROMI Agree with Myoview, if ok D/C home Continue Bp control Continue DM control Increase activity Probably non cardiac Medical therapy for now Continue statin therapy F/U as an outpt   Electronic Signatures: Yolonda Kida (MD)  (Signed 13-Jan-14 16:13)  Authored: Chief Complaint, VITAL SIGNS/ANCILLARY NOTES, Brief Assessment, Lab Results, Assessment/Plan   Last Updated: 13-Jan-14 16:13 by Yolonda Kida (MD)

## 2014-11-30 NOTE — Op Note (Signed)
PATIENT NAME:  Meghan [name], Meghan [name] MR#:  [id number] DATE OF BIRTH:  1946-07-14  DATE OF OPERATION:  12/13/2012  PREOPERATIVE DIAGNOSIS: Recurrent hammertoe deformities, right second and third toes.   POSTOPERATIVE DIAGNOSIS: Recurrent hammertoe deformities, right second and third toes.   OPERATION: Hammertoe correction, right second and third toes, with proximal interphalangeal joint fusions and extensor tendon lengthening.   SURGEON: Park Breed, M.D.   ANESTHESIA: General LMA.   COMPLICATIONS: None.   DRAINS: None.   ESTIMATED BLOOD LOSS: Minimal.   REPLACEMENTS: None.   OPERATIVE PROCEDURE: The patient was brought to the operating room where she underwent satisfactory general LMA anesthesia in the supine position. The lower leg was prepped and draped in sterile fashion. A pneumatic Esmarch was applied and the pneumatic tourniquet inflated, but it did not seemed to hold very well just below the ankle. The foot was re-wrapped with the Esmarch bandage which was left in place just below the ankle as the tourniquet. Tourniquet time was 32 minutes.   A longitudinal incision was made in the dorsum of both toes going across the PIP joint proximally. Dissection was carried out sharply all the way down to bone through the extensor hood. The extensors were freed up medially and laterally. The PIP joint was exposed in each toe and the oscillating saw used to remove a small wafer of bone from the end of each bone. A 0.045 C-wire was retrograded, was advanced out the toe, and then retrograded back across the fusion site into the proximal phalanx of each toe. Fluoroscopy showed both the toes to be well-aligned and the pins to be in good position.   The pins were bent and cut. They were left just barely superficially exposed. The proximal extensor tendons were released in a Z fashion and sutured with 4-0 Vicryl suture to take tension off the dorsum of the toe. This allowed for much better balance. The  wounds were then irrigated. The tourniquet was removed before closure, and there was good circulation to both toes. The wounds were closed with running 5-0 nylon sutures and 0.5% Marcaine was placed into the wounds. A dry sterile dressing was applied, with a volar splint.   The patient was awakened and taken to recovery in good condition.    ____________________________ Park Breed, MD hem:dm D: 12/13/2012 09:16:22 ET T: 12/13/2012 09:44:44 ET JOB#: [id number]  cc: Park Breed, MD, <Dictator> Park Breed MD ELECTRONICALLY SIGNED 12/14/2012 14:14

## 2014-12-09 NOTE — Op Note (Signed)
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  PROCEDURE PERFORMED: Open-reduction internal fixation, left distal radius with [employer] left narrowed DVR plate and screws.   SURGEON: Park Breed, MD   ANESTHESIA: General LMA.   COMPLICATIONS: None.   DRAINS: None.   ESTIMATED BLOOD LOSS: Minimal. Replaced: None.   DESCRIPTION OF OPERATIVE PROCEDURE: The patient was brought to the operating room, where she underwent satisfactory general LMA anesthesia in the supine position and was padded appropriately. The left arm was prepped and draped in sterile fashion. An Esmarch was applied. The tourniquet was inflated to 250 mmHg, and it was increased to 300 mmHg during the procedure due to dome bloody leakage. The finger traps were applied to the index and middle finger, with 7 pounds of traction total applied. The wrist was manipulated. A longitudinal incision was made, starting at the base the palm, extending proximally over the flexor carpi radialis tendon. Dissection was carried out bluntly to subcutaneous tissue using loupe magnification. The flexor carpi radialis tendon sheath was opened on the top and the tendon retracted ulnarly. The bottom of the sheath was opened, and all the musculotendinous units were retracted ulnarly and with the neurovascular bundle retracted radially. The shaft of the radius was exposed proximally and periosteal elevator used to free it from debris. This was followed distally and the pronator quadratus was released from its radial attachment. This exposed the entire distal shaft of the radius and the fractured distal portion. The fracture was seen to be shortened volarly and radially, but there was significant movement still visible,  indicating fibrous union. This was freed up carefully using periosteal elevators and rongeurs and release of soft tissue tethers. The distal fragment could be brought out to full length and moved dorsally to allow for anatomic reduction. With the fracture reduced under direct vision and held manually, a K wire was drilled through the radial styloid into the radial shaft to fix the fragment. Fluoroscopy showed good reduction of the length and angulation. The left narrow [employer] DVR plate was then placed over the volar aspect of the fracture site and a 2.5 mm drill used through sliding hole in the plate. A 12 mm screw was inserted and the plate was position. Fluoroscopy showed it was just a little short, so the screw was loosened and the plate was brought out a little farther. This showed good position on AP and lateral planes. The distal row was filled with 2 fully threaded screws and the proximal row was filled with 3 pegs. The remaining 2 screw holes in the radial shaft were filled with a 12 mm screws. Final fluoroscopy showed excellent position and alignment. The wounds were irrigated. The subcutaneous tissue was closed with 3-0 Vicryl, and the skin was closed with staples. Marcaine 0.5% was placed in the wounds and a dry sterile compression hand dressing was applied with volar splint. The tourniquet was deflated with good return of blood flow to the hand. Compression was applied for 5 minutes. The patient was then awakened and taken to recovery in good condition.     ____________________________ Park Breed, MD hem:mw D: 10/22/2014 15:10:23 ET T: 10/22/2014 17:16:37 ET JOB#: [id number]  cc: Park Breed, MD, <Dictator> Park Breed MD ELECTRONICALLY SIGNED 10/25/2014 10:14

## 2014-12-09 NOTE — Consult Note (Signed)
PATIENT NAME:  Meghan [name], Meghan [name] MR#:  [id number] DATE OF BIRTH:  Dec 16, 1945  DATE OF CONSULTATION:  09/19/2014  REQUESTED BY:    Cari B. Vanessa Portales, NP CONSULTING PHYSICIAN:  Pascal Lux, MD  REASON FOR CONSULTATION:  I have been asked by Ms. Triplett to evaluate this pleasant woman for a left wrist injury.   HISTORY OF PRESENT ILLNESS: Briefly, she is a 69 year old female with a history of diabetes, hypertension, COPD, and osteoporosis who apparently was on her way to get her hair  cut when she slipped and fell in the parking lot of [employer], landing on her outstretched left hand. She denies any lightheadedness or loss of consciousness that may have precipitated her fall. She does note that she felt as though her knee "gave out on her" precipitating the fall. She denies any associated injuries. She did not strike her head and denies any loss of consciousness as a result of the fall. She denies any chest pain or shortness of breath. She was brought to the Emergency Room where x-rays demonstrated a volarly displaced, extra-articular distal radius fracture.   PAST MEDICAL HISTORY:  As noted above.  PHYSICAL EXAMINATION: GENERAL: On examination, we have a pleasant, overweight, middle-aged female appearing older than her stated age. She is alert and oriented x 3.  ORTHOPEDIC EXAMINATION: Limited to the left hand and wrist. There is obvious swelling of the wrist with deformity. She has moderate tenderness to palpation over the distal radius. This pain also was reproduced at any attempt at active or passive motion of the wrist. She is able to actively flex and extend all digits. She has intact sensation to light touch to all digits and good capillary refill to all digits. The overlying skin is intact without any evidence of rashes,  lacerations, abrasions, or ecchymosis.   IMAGING:  X-rays of the left wrist are available for review. These films are as described above. The fracture does appear to be  extra-articular. There is moderate shortening of the distal radius as compared to the ulna. The articular surface appears to be intact and the slope is to the volar incline, the volar tilt is maintained.   IMPRESSION: Volar Barton fracture of left distal radius.   PLAN: The treatment options are discussed with the patient.   After obtaining verbal consent, the left wrist is reduced using fingertrap traction and manipulation under a hematoma block. The hematoma block was performed sterilely using 5 mL of 0.5% Sensorcaine and 5 mL of 1% lidocaine. The patient tolerated both the administration of the block and the manipulation well. Post-reduction films are pending.   The patient has been instructed to keep the splint dry and intact and to keep the left hand elevated above heart level. She is to be given a prescription for oxycodone 5 mg tablets 1-2 p.o. q. 6 hours p.r.n. pain.  She will return to my office for re-evaluation on Monday, February 15.   Thank you for asking me to participate in the care of this most pleasant woman. I will be happy to keep you abreast of her progress.   ____________________________ Lenna Sciara. Dorien Chihuahua, MD jjp:LT D: 09/19/2014 16:26:52 ET T: 09/19/2014 16:55:03 ET JOB#: [id number]  cc: Pascal Lux, MD, <Dictator> Pascal Lux MD ELECTRONICALLY SIGNED 09/20/2014 11:34

## 2014-12-23 DIAGNOSIS — W19XXXA Unspecified fall, initial encounter: Secondary | ICD-10-CM | POA: Diagnosis not present

## 2014-12-23 DIAGNOSIS — R51 Headache: Secondary | ICD-10-CM | POA: Diagnosis not present

## 2014-12-24 ENCOUNTER — Emergency Department: Payer: Medicare Other

## 2014-12-24 ENCOUNTER — Inpatient Hospital Stay: Payer: Medicare Other

## 2014-12-24 ENCOUNTER — Encounter: Payer: Self-pay | Admitting: Emergency Medicine

## 2014-12-24 ENCOUNTER — Inpatient Hospital Stay
Admission: EM | Admit: 2014-12-24 | Discharge: 2014-12-25 | DRG: 556 | Disposition: A | Discharge status: Home / Self Care | Payer: Medicare Other | Attending: Internal Medicine | Admitting: Internal Medicine

## 2014-12-24 DIAGNOSIS — Z888 Allergy status to other drugs, medicaments and biological substances status: Secondary | ICD-10-CM | POA: Diagnosis not present

## 2014-12-24 DIAGNOSIS — E872 Acidosis: Secondary | ICD-10-CM | POA: Diagnosis not present

## 2014-12-24 DIAGNOSIS — R110 Nausea: Secondary | ICD-10-CM | POA: Diagnosis not present

## 2014-12-24 DIAGNOSIS — F32A Depression, unspecified: Secondary | ICD-10-CM | POA: Diagnosis present

## 2014-12-24 DIAGNOSIS — Z96653 Presence of artificial knee joint, bilateral: Secondary | ICD-10-CM | POA: Diagnosis present

## 2014-12-24 DIAGNOSIS — F419 Anxiety disorder, unspecified: Secondary | ICD-10-CM | POA: Diagnosis not present

## 2014-12-24 DIAGNOSIS — Z8249 Family history of ischemic heart disease and other diseases of the circulatory system: Secondary | ICD-10-CM | POA: Diagnosis not present

## 2014-12-24 DIAGNOSIS — M069 Rheumatoid arthritis, unspecified: Secondary | ICD-10-CM | POA: Diagnosis present

## 2014-12-24 DIAGNOSIS — Z833 Family history of diabetes mellitus: Secondary | ICD-10-CM | POA: Diagnosis not present

## 2014-12-24 DIAGNOSIS — Z886 Allergy status to analgesic agent status: Secondary | ICD-10-CM | POA: Diagnosis not present

## 2014-12-24 DIAGNOSIS — R059 Cough, unspecified: Secondary | ICD-10-CM

## 2014-12-24 DIAGNOSIS — A419 Sepsis, unspecified organism: Secondary | ICD-10-CM | POA: Diagnosis present

## 2014-12-24 DIAGNOSIS — Z66 Do not resuscitate: Secondary | ICD-10-CM | POA: Diagnosis not present

## 2014-12-24 DIAGNOSIS — Z8739 Personal history of other diseases of the musculoskeletal system and connective tissue: Secondary | ICD-10-CM

## 2014-12-24 DIAGNOSIS — R42 Dizziness and giddiness: Secondary | ICD-10-CM | POA: Diagnosis not present

## 2014-12-24 DIAGNOSIS — Y92009 Unspecified place in unspecified non-institutional (private) residence as the place of occurrence of the external cause: Secondary | ICD-10-CM

## 2014-12-24 DIAGNOSIS — F4310 Post-traumatic stress disorder, unspecified: Secondary | ICD-10-CM | POA: Diagnosis present

## 2014-12-24 DIAGNOSIS — F329 Major depressive disorder, single episode, unspecified: Secondary | ICD-10-CM | POA: Diagnosis not present

## 2014-12-24 DIAGNOSIS — Z9049 Acquired absence of other specified parts of digestive tract: Secondary | ICD-10-CM | POA: Diagnosis present

## 2014-12-24 DIAGNOSIS — M25462 Effusion, left knee: Secondary | ICD-10-CM | POA: Diagnosis not present

## 2014-12-24 DIAGNOSIS — K219 Gastro-esophageal reflux disease without esophagitis: Secondary | ICD-10-CM | POA: Diagnosis not present

## 2014-12-24 DIAGNOSIS — I10 Essential (primary) hypertension: Secondary | ICD-10-CM | POA: Diagnosis present

## 2014-12-24 DIAGNOSIS — R05 Cough: Secondary | ICD-10-CM | POA: Diagnosis not present

## 2014-12-24 DIAGNOSIS — J209 Acute bronchitis, unspecified: Secondary | ICD-10-CM | POA: Diagnosis present

## 2014-12-24 DIAGNOSIS — W010XXA Fall on same level from slipping, tripping and stumbling without subsequent striking against object, initial encounter: Secondary | ICD-10-CM | POA: Diagnosis present

## 2014-12-24 DIAGNOSIS — Z96622 Presence of left artificial elbow joint: Secondary | ICD-10-CM | POA: Diagnosis not present

## 2014-12-24 DIAGNOSIS — J410 Simple chronic bronchitis: Secondary | ICD-10-CM | POA: Diagnosis not present

## 2014-12-24 DIAGNOSIS — J449 Chronic obstructive pulmonary disease, unspecified: Secondary | ICD-10-CM | POA: Diagnosis present

## 2014-12-24 DIAGNOSIS — J440 Chronic obstructive pulmonary disease with acute lower respiratory infection: Secondary | ICD-10-CM | POA: Diagnosis present

## 2014-12-24 DIAGNOSIS — E669 Obesity, unspecified: Secondary | ICD-10-CM | POA: Diagnosis present

## 2014-12-24 DIAGNOSIS — M8580 Other specified disorders of bone density and structure, unspecified site: Secondary | ICD-10-CM | POA: Diagnosis present

## 2014-12-24 DIAGNOSIS — M25569 Pain in unspecified knee: Secondary | ICD-10-CM

## 2014-12-24 DIAGNOSIS — Z8639 Personal history of other endocrine, nutritional and metabolic disease: Secondary | ICD-10-CM

## 2014-12-24 DIAGNOSIS — Z87891 Personal history of nicotine dependence: Secondary | ICD-10-CM | POA: Diagnosis not present

## 2014-12-24 DIAGNOSIS — Z981 Arthrodesis status: Secondary | ICD-10-CM

## 2014-12-24 DIAGNOSIS — M25562 Pain in left knee: Principal | ICD-10-CM | POA: Diagnosis present

## 2014-12-24 DIAGNOSIS — M25561 Pain in right knee: Secondary | ICD-10-CM | POA: Diagnosis not present

## 2014-12-24 DIAGNOSIS — M1990 Unspecified osteoarthritis, unspecified site: Secondary | ICD-10-CM | POA: Diagnosis not present

## 2014-12-24 DIAGNOSIS — E119 Type 2 diabetes mellitus without complications: Secondary | ICD-10-CM | POA: Diagnosis present

## 2014-12-24 DIAGNOSIS — E876 Hypokalemia: Secondary | ICD-10-CM | POA: Diagnosis present

## 2014-12-24 DIAGNOSIS — Z6834 Body mass index (BMI) 34.0-34.9, adult: Secondary | ICD-10-CM

## 2014-12-24 DIAGNOSIS — Z471 Aftercare following joint replacement surgery: Secondary | ICD-10-CM | POA: Diagnosis not present

## 2014-12-24 DIAGNOSIS — R6510 Systemic inflammatory response syndrome (SIRS) of non-infectious origin without acute organ dysfunction: Secondary | ICD-10-CM

## 2014-12-24 DIAGNOSIS — S0990XA Unspecified injury of head, initial encounter: Secondary | ICD-10-CM | POA: Diagnosis not present

## 2014-12-24 DIAGNOSIS — R6511 Systemic inflammatory response syndrome (SIRS) of non-infectious origin with acute organ dysfunction: Secondary | ICD-10-CM | POA: Diagnosis not present

## 2014-12-24 LAB — BASIC METABOLIC PANEL
ANION GAP: 12 (ref 5–15)
Anion gap: 10 (ref 5–15)
BUN: 15 mg/dL (ref 6–20)
BUN: 15 mg/dL (ref 6–20)
CALCIUM: 9.2 mg/dL (ref 8.9–10.3)
CO2: 31 mmol/L (ref 22–32)
CO2: 29 mmol/L (ref 22–32)
CREATININE: 0.87 mg/dL (ref 0.44–1.00)
Calcium: 8.5 mg/dL — ABNORMAL LOW (ref 8.9–10.3)
Chloride: 91 mmol/L — ABNORMAL LOW (ref 101–111)
Chloride: 93 mmol/L — ABNORMAL LOW (ref 101–111)
Creatinine, Ser: 0.88 mg/dL (ref 0.44–1.00)
GFR calc Af Amer: >60 mL/min (ref >60)
GFR calc Af Amer: >60 mL/min (ref >60)
GFR calc non Af Amer: >60 mL/min (ref >60)
Glucose, Bld: 279 mg/dL — ABNORMAL HIGH (ref 65–99)
Glucose, Bld: 290 mg/dL — ABNORMAL HIGH (ref 65–99)
POTASSIUM: 2.7 mmol/L — AB (ref 3.5–5.1)
Potassium: 2.5 mmol/L — CL (ref 3.5–5.1)
SODIUM: 132 mmol/L — AB (ref 135–145)
Sodium: 134 mmol/L — ABNORMAL LOW (ref 135–145)

## 2014-12-24 LAB — LACTIC ACID, PLASMA
LACTIC ACID, VENOUS: 3.9 mmol/L — AB (ref 0.5–2.0)
LACTIC ACID, VENOUS: 2.3 mmol/L — AB (ref 0.5–2.0)

## 2014-12-24 LAB — URINALYSIS COMPLETE WITH MICROSCOPIC (ARMC ONLY)
BILIRUBIN URINE: NEGATIVE
Glucose, UA: 150 mg/dL — AB
Hgb urine dipstick: NEGATIVE
KETONES UR: NEGATIVE mg/dL
Leukocytes, UA: NEGATIVE
Nitrite: NEGATIVE
PH: 6 (ref 5.0–8.0)
PROTEIN: 30 mg/dL — AB
SPECIFIC GRAVITY, URINE: 1.025 (ref 1.005–1.030)

## 2014-12-24 LAB — GLUCOSE, CAPILLARY
GLUCOSE-CAPILLARY: 245 mg/dL — AB (ref 65–99)
Glucose-Capillary: 248 mg/dL — ABNORMAL HIGH (ref 65–99)
Glucose-Capillary: 265 mg/dL — ABNORMAL HIGH (ref 65–99)
Glucose-Capillary: 178 mg/dL — ABNORMAL HIGH (ref 65–99)
Glucose-Capillary: 310 mg/dL — ABNORMAL HIGH (ref 65–99)

## 2014-12-24 LAB — CBC
HCT: 39.1 % (ref 35.0–47.0)
HEMATOCRIT: 36.7 % (ref 35.0–47.0)
HEMOGLOBIN: 12.4 g/dL (ref 12.0–16.0)
Hemoglobin: 13.3 g/dL (ref 12.0–16.0)
MCH: 26.7 pg (ref 26.0–34.0)
MCH: 26.9 pg (ref 26.0–34.0)
MCHC: 33.9 g/dL (ref 32.0–36.0)
MCHC: 33.9 g/dL (ref 32.0–36.0)
MCV: 78.7 fL — ABNORMAL LOW (ref 80.0–100.0)
MCV: 79.3 fL — ABNORMAL LOW (ref 80.0–100.0)
Platelets: 219 10*3/uL (ref 150–440)
Platelets: 199 10*3/uL (ref 150–440)
RBC: 4.97 MIL/uL (ref 3.80–5.20)
RBC: 4.63 MIL/uL (ref 3.80–5.20)
RDW: 15.3 % — ABNORMAL HIGH (ref 11.5–14.5)
RDW: 15.3 % — ABNORMAL HIGH (ref 11.5–14.5)
WBC: 7.3 10*3/uL (ref 3.6–11.0)
WBC: 7.8 10*3/uL (ref 3.6–11.0)

## 2014-12-24 LAB — C-REACTIVE PROTEIN: CRP: 5.9 mg/dL — ABNORMAL HIGH (ref <1.0)

## 2014-12-24 LAB — MAGNESIUM: MAGNESIUM: 1.6 mg/dL — AB (ref 1.7–2.4)

## 2014-12-24 LAB — TROPONIN I: TROPONIN I: 0.03 ng/mL (ref <0.031)

## 2014-12-24 LAB — SEDIMENTATION RATE: Sed Rate: 45 mm/hr — ABNORMAL HIGH (ref 0–30)

## 2014-12-24 MED ORDER — PANTOPRAZOLE SODIUM 40 MG PO TBEC
40.0000 mg | DELAYED_RELEASE_TABLET | Freq: Every day | ORAL | Status: DC
  Administered 2014-12-24 – 2014-12-25 (×2): 40 mg via ORAL
  Filled 2014-12-24 (×2): qty 1

## 2014-12-24 MED ORDER — INSULIN ASPART 100 UNIT/ML ~~LOC~~ SOLN
0.0000 [IU] | Freq: Three times a day (TID) | SUBCUTANEOUS | Status: DC
  Administered 2014-12-24: 2 [IU] via SUBCUTANEOUS
  Administered 2014-12-24: 5 [IU] via SUBCUTANEOUS
  Administered 2014-12-24: 3 [IU] via SUBCUTANEOUS
  Administered 2014-12-25: 5 [IU] via SUBCUTANEOUS
  Administered 2014-12-25: 2 [IU] via SUBCUTANEOUS
  Filled 2014-12-24 (×2): qty 2
  Filled 2014-12-24: qty 3
  Filled 2014-12-24 (×2): qty 5

## 2014-12-24 MED ORDER — VANCOMYCIN HCL IN DEXTROSE 1-5 GM/200ML-% IV SOLN
INTRAVENOUS | Status: AC
  Administered 2014-12-24: 1000 mg via INTRAVENOUS
  Filled 2014-12-24: qty 200

## 2014-12-24 MED ORDER — VANCOMYCIN HCL IN DEXTROSE 750-5 MG/150ML-% IV SOLN
750.0000 mg | INTRAVENOUS | Status: DC
  Administered 2014-12-24 – 2014-12-25 (×2): 750 mg via INTRAVENOUS
  Filled 2014-12-24 (×3): qty 150

## 2014-12-24 MED ORDER — ENOXAPARIN SODIUM 40 MG/0.4ML ~~LOC~~ SOLN
40.0000 mg | SUBCUTANEOUS | Status: DC
  Administered 2014-12-24 – 2014-12-25 (×2): 40 mg via SUBCUTANEOUS
  Filled 2014-12-24 (×2): qty 0.4

## 2014-12-24 MED ORDER — HYDROCODONE-ACETAMINOPHEN 5-325 MG PO TABS
1.0000 | ORAL_TABLET | ORAL | Status: DC | PRN
  Administered 2014-12-24 – 2014-12-25 (×4): 2 via ORAL
  Filled 2014-12-24 (×4): qty 2

## 2014-12-24 MED ORDER — LAMOTRIGINE 100 MG PO TABS
100.0000 mg | ORAL_TABLET | Freq: Two times a day (BID) | ORAL | Status: DC
  Administered 2014-12-24: 100 mg via ORAL
  Filled 2014-12-24: qty 1

## 2014-12-24 MED ORDER — HYDROCHLOROTHIAZIDE 12.5 MG PO CAPS
12.5000 mg | ORAL_CAPSULE | Freq: Every day | ORAL | Status: DC
  Administered 2014-12-24 – 2014-12-25 (×2): 12.5 mg via ORAL
  Filled 2014-12-24 (×2): qty 1

## 2014-12-24 MED ORDER — GUAIFENESIN 100 MG/5ML PO SOLN: 10.0000 mL | Freq: Four times a day (QID) | ORAL | Status: DC | PRN

## 2014-12-24 MED ORDER — POTASSIUM CHLORIDE 20 MEQ PO PACK
PACK | ORAL | Status: AC
  Administered 2014-12-24: 40 meq via ORAL
  Filled 2014-12-24: qty 2

## 2014-12-24 MED ORDER — MORPHINE SULFATE 2 MG/ML IJ SOLN
2.0000 mg | Freq: Once | INTRAMUSCULAR | Status: AC
  Administered 2014-12-24: 2 mg via INTRAVENOUS

## 2014-12-24 MED ORDER — POTASSIUM CHLORIDE 20 MEQ PO PACK
40.0000 meq | PACK | Freq: Once | ORAL | Status: AC
  Administered 2014-12-24: 40 meq via ORAL

## 2014-12-24 MED ORDER — OXYCODONE HCL 5 MG PO TABS
5.0000 mg | ORAL_TABLET | Freq: Once | ORAL | Status: AC
Start: 2014-12-24 — End: 2014-12-24
  Administered 2014-12-24: 5 mg via ORAL

## 2014-12-24 MED ORDER — NORTRIPTYLINE HCL 10 MG PO CAPS
30.0000 mg | ORAL_CAPSULE | Freq: Every day | ORAL | Status: DC
  Administered 2014-12-24: 30 mg via ORAL
  Filled 2014-12-24 (×2): qty 3

## 2014-12-24 MED ORDER — MAGNESIUM SULFATE IN D5W 10-5 MG/ML-% IV SOLN
1.0000 g | Freq: Once | INTRAVENOUS | Status: AC
  Administered 2014-12-24: 1 g via INTRAVENOUS
  Filled 2014-12-24: qty 100

## 2014-12-24 MED ORDER — SODIUM CHLORIDE 0.9 % IJ SOLN
3.0000 mL | Freq: Two times a day (BID) | INTRAMUSCULAR | Status: DC
  Administered 2014-12-24 – 2014-12-25 (×2): 3 mL via INTRAVENOUS

## 2014-12-24 MED ORDER — TOFACITINIB CITRATE 5 MG PO TABS
5.0000 mg | ORAL_TABLET | Freq: Two times a day (BID) | ORAL | Status: DC
  Filled 2014-12-24 (×3): qty 1

## 2014-12-24 MED ORDER — POTASSIUM CHLORIDE 10 MEQ/100ML IV SOLN
10.0000 meq | INTRAVENOUS | Status: AC
  Administered 2014-12-24 – 2014-12-25 (×4): 10 meq via INTRAVENOUS
  Filled 2014-12-24 (×4): qty 100

## 2014-12-24 MED ORDER — MORPHINE SULFATE 2 MG/ML IJ SOLN
INTRAMUSCULAR | Status: AC
  Administered 2014-12-24: 2 mg via INTRAVENOUS
  Filled 2014-12-24: qty 1

## 2014-12-24 MED ORDER — ALPRAZOLAM 0.5 MG PO TABS: 0.5000 mg | ORAL_TABLET | Freq: Two times a day (BID) | ORAL | Status: DC | PRN

## 2014-12-24 MED ORDER — SODIUM CHLORIDE 0.9 % IV SOLN
INTRAVENOUS | Status: DC
  Administered 2014-12-24 – 2014-12-25 (×4): via INTRAVENOUS

## 2014-12-24 MED ORDER — PIPERACILLIN-TAZOBACTAM 3.375 G IVPB
3.3750 g | Freq: Three times a day (TID) | INTRAVENOUS | Status: DC
  Administered 2014-12-24 – 2014-12-25 (×3): 3.375 g via INTRAVENOUS
  Filled 2014-12-24 (×7): qty 50

## 2014-12-24 MED ORDER — PIPERACILLIN-TAZOBACTAM 3.375 G IVPB 30 MIN
3.3750 g | Freq: Once | INTRAVENOUS | Status: AC
  Administered 2014-12-24: 3.375 g via INTRAVENOUS

## 2014-12-24 MED ORDER — PIPERACILLIN-TAZOBACTAM 3.375 G IVPB
INTRAVENOUS | Status: AC
  Administered 2014-12-24: 3.375 g via INTRAVENOUS
  Filled 2014-12-24: qty 50

## 2014-12-24 MED ORDER — INSULIN ASPART 100 UNIT/ML ~~LOC~~ SOLN
0.0000 [IU] | Freq: Every day | SUBCUTANEOUS | Status: DC
Start: 2014-12-24 — End: 2014-12-25
  Administered 2014-12-24: 4 [IU] via SUBCUTANEOUS
  Filled 2014-12-24: qty 4

## 2014-12-24 MED ORDER — SODIUM CHLORIDE 0.9 % IV BOLUS (SEPSIS)
2220.0000 mL | Freq: Once | INTRAVENOUS | Status: AC
  Administered 2014-12-24: 2220 mL via INTRAVENOUS
  Administered 2014-12-24: 1000 mL via INTRAVENOUS

## 2014-12-24 MED ORDER — ESCITALOPRAM OXALATE 10 MG PO TABS
20.0000 mg | ORAL_TABLET | Freq: Every day | ORAL | Status: DC
  Administered 2014-12-24 – 2014-12-25 (×2): 20 mg via ORAL
  Filled 2014-12-24 (×2): qty 2

## 2014-12-24 MED ORDER — OXYCODONE HCL 5 MG PO TABS
ORAL_TABLET | ORAL | Status: AC
  Administered 2014-12-24: 5 mg via ORAL
  Filled 2014-12-24: qty 1

## 2014-12-24 MED ORDER — ACETAMINOPHEN 650 MG RE SUPP: 650.0000 mg | Freq: Four times a day (QID) | RECTAL | Status: DC | PRN

## 2014-12-24 MED ORDER — PREGABALIN 50 MG PO CAPS
50.0000 mg | ORAL_CAPSULE | Freq: Two times a day (BID) | ORAL | Status: DC
  Administered 2014-12-24 – 2014-12-25 (×3): 50 mg via ORAL
  Filled 2014-12-24 (×3): qty 1

## 2014-12-24 MED ORDER — VANCOMYCIN HCL IN DEXTROSE 1-5 GM/200ML-% IV SOLN
1000.0000 mg | Freq: Once | INTRAVENOUS | Status: AC
  Administered 2014-12-24: 1000 mg via INTRAVENOUS

## 2014-12-24 MED ORDER — ACETAMINOPHEN 325 MG PO TABS: 650.0000 mg | ORAL_TABLET | Freq: Four times a day (QID) | ORAL | Status: DC | PRN

## 2014-12-24 MED ORDER — VANCOMYCIN HCL IN DEXTROSE 1-5 GM/200ML-% IV SOLN: 1000.0000 mg | INTRAVENOUS | Status: DC

## 2014-12-24 MED ORDER — CLONIDINE HCL 0.1 MG PO TABS
0.1000 mg | ORAL_TABLET | Freq: Two times a day (BID) | ORAL | Status: DC
  Administered 2014-12-24 – 2014-12-25 (×3): 0.1 mg via ORAL
  Filled 2014-12-24 (×3): qty 1

## 2014-12-24 NOTE — Progress Notes (Signed)
Dr. Fritzi Mandes notified of critical low potassium level of 2.4 @ 1602 on 12/24/14. "pharmacy is managing potassium replacement" per Dr. Posey Pronto.

## 2014-12-24 NOTE — ED Notes (Signed)
Pt up to room commode to void for urine sample ordered by MD; pt with steady gait ntoed; stand-by assist only

## 2014-12-24 NOTE — Progress Notes (Signed)
MEDICATION RELATED CONSULT NOTE - FOLLOW UP   Pharmacy Consult for Electrolytes Indication: Hypokalemia and Hypomagnesium  Allergies  Allergen Reactions  . Gabapentin   . Iodine     syncopy  . Naproxen     hypotension  . Nsaids     Patient Measurements: Height: 4\' 11"  (149.9 cm) Weight: 168 lb 11.2 oz (76.522 kg) IBW/kg (Calculated) : 43.2 Adjusted Body Weight: N/A  Vital Signs: Temp: 98.3 F (36.8 C) (05/16 1602) Temp Source: Oral (05/16 1602) BP: 136/70 mmHg (05/16 1602) Pulse Rate: 109 (05/16 1602) Intake/Output from previous day:   Intake/Output from this shift:    Labs:  Recent Labs  12/24/14 0116 12/24/14 0529 12/24/14 1422  WBC 7.3 7.8  --   HGB 13.3 12.4  --   HCT 39.1 36.7  --   PLT 219 199  --   CREATININE 0.88 0.87  --   MG  --   --  1.6*   Estimated Creatinine Clearance: 55.2 mL/min (by C-G formula based on Cr of 0.87).   Microbiology: Recent Results (from the past 720 hour(s))  Blood culture (routine x 2)     Status: None (Preliminary result)   Collection Time: 12/24/14  2:37 AM  Result Value Ref Range Status   Specimen Description BLOOD  Final   Special Requests SET 1  Final   Culture NO GROWTH < 12 HOURS  Final   Report Status PENDING  Incomplete  Blood culture (routine x 2)     Status: None (Preliminary result)   Collection Time: 12/24/14  2:38 AM  Result Value Ref Range Status   Specimen Description BLOOD  Final   Special Requests SET2  Final   Culture NO GROWTH < 12 HOURS  Final   Report Status PENDING  Incomplete    Medications:  Scheduled:  . cloNIDine  0.1 mg Oral BID  . enoxaparin (LOVENOX) injection  40 mg Subcutaneous Q24H  . escitalopram  20 mg Oral Daily  . hydrochlorothiazide  12.5 mg Oral Daily  . insulin aspart  0-5 Units Subcutaneous QHS  . insulin aspart  0-9 Units Subcutaneous TID WC  . magnesium sulfate 1 - 4 g bolus IVPB  1 g Intravenous Once  . nortriptyline  30 mg Oral QHS  . pantoprazole  40 mg Oral  Daily  . piperacillin-tazobactam  3.375 g Intravenous Q8H  . potassium chloride  10 mEq Intravenous Q1 Hr x 4  . pregabalin  50 mg Oral BID  . sodium chloride  3 mL Intravenous Q12H  . Tofacitinib Citrate  5 mg Oral BID  . vancomycin  750 mg Intravenous Q18H   Infusions:  . sodium chloride 125 mL/hr at 12/24/14 1418    Assessment: K=2.4, Mag=1.6  Goal of Therapy:  Normalization of electrolytes  Plan:  Will order KCl 33mEq IV x 4 and Magnesium Sulfate 1g IV once. Will f/u on am labs.  Paulina Fusi, PharmD, BCPS 12/24/2014 8:21 PM

## 2014-12-24 NOTE — H&P (Signed)
[facility] NAME: Meghan [name]    MR#:  [id number]  DATE OF BIRTH:  [birth year]  DATE OF ADMISSION:  12/24/2014  PRIMARY CARE PHYSICIAN: No primary care provider on file.   REQUESTING/REFERRING PHYSICIAN: Edd Fabian  CHIEF COMPLAINT:   Chief Complaint  Patient presents with  . Weakness    Pt presents to ER via EMS from home. Pt fell after feeling dizzy today. Pt disoriented at this time, family reports this is abnormal and has been going on for 2 weeks.    HISTORY OF PRESENT ILLNESS:  Meghan [name]  is a 69 y.o. female who presents to the ED after a fall at home. She states that a couple weeks ago she had significant episode of dizziness that led to a fall, but that tonight she had a fall due to her right knee catching. She states that she has had bilateral knee pain left greater than right for a little more than a day now. She denies any fevers or chills, nausea or vomiting, cough, urinary symptoms. See full review of systems below. In the ED she was found to meet SIRS criteria, being tachycardic and tachypneic. She was also found to have a lactic acid of 3.9. Chest x-ray was not very suggestive of pneumonia, UA did not look like urinary tract infection. Question septic joint in that left knee. CT head negative for acute process. Hospitalists were called for admission for sepsis.   Of note, patient has a very high number of medications listed as home meds, she will need a thorough medication reconciliation.  PAST MEDICAL HISTORY:   Past Medical History  Diagnosis Date  . COPD (chronic obstructive pulmonary disease)   . Diabetes mellitus   . Hypertension   . Depression   . Anxiety   . Arthritis     RA  . GERD (gastroesophageal reflux disease)   . Osteopenia   . DDD (degenerative disc disease)   . Obese   . PTSD (post-traumatic stress disorder)     PAST SURGICAL HISTORY:   Past Surgical History  Procedure Laterality Date  .  Hand reconstruction  2011    right-multiple finger joint reconst  . Abdominal hysterectomy    . Cervical fusion    . Joint replacement      bilat knee replacements  . Cholecystectomy    . Foot arthroplasty      toes x2 rt foot  . Finger arthroplasty  02/23/2012    Procedure: FINGER ARTHROPLASTY;  Surgeon: Cammie Sickle., MD;  Location: [facility];  Service: Orthopedics;  Laterality: Left;  Extensor carpi radialis longus to Extensor carpi ulnaris transfer, left Metaphalangeal reconstructions of index and long fingers,    SOCIAL HISTORY:   History  Substance Use Topics  . Smoking status: Former Research scientist (life sciences)  . Smokeless tobacco: Not on file  . Alcohol Use: No    FAMILY HISTORY:   Family History  Problem Relation Age of Onset  . Depression Sister   . CAD    . Hypertension    . Diabetes Mellitus II    . Arthritis      DRUG ALLERGIES:   Allergies  Allergen Reactions  . Gabapentin   . Iodine     syncopy  . Naproxen     hypotension  . Nsaids     MEDICATIONS AT HOME:   Prior to Admission medications   Medication Sig Start Date End Date Taking? Authorizing  Provider  hydrochlorothiazide (MICROZIDE) 12.5 MG capsule Take 12.5 mg by mouth daily.   Yes Historical Provider, MD  levocetirizine (XYZAL) 5 MG tablet Take 5 mg by mouth every evening.   Yes Historical Provider, MD  meloxicam (MOBIC) 7.5 MG tablet Take 7.5 mg by mouth daily.   Yes Historical Provider, MD  predniSONE (DELTASONE) 5 MG tablet Take 5 mg by mouth daily with breakfast.   Yes Historical Provider, MD  pregabalin (LYRICA) 50 MG capsule Take 50 mg by mouth 2 (two) times daily.   Yes Historical Provider, MD  Tofacitinib Citrate 5 MG TABS Take by mouth.   Yes Historical Provider, MD  albuterol (PROVENTIL HFA;VENTOLIN HFA) 108 (90 BASE) MCG/ACT inhaler Inhale 2 puffs into the lungs every 6 (six) hours as needed.    Historical Provider, MD  albuterol (PROVENTIL HFA;VENTOLIN HFA) 108 (90 BASE) MCG/ACT  inhaler Inhale 1 puff into the lungs.    Historical Provider, MD  alendronate (FOSAMAX) 70 MG tablet Take 70 mg by mouth every 7 (seven) days. Take with a full glass of water on an empty stomach.    Historical Provider, MD  ALPRAZolam Duanne Moron) 0.5 MG tablet Take 0.5 mg by mouth as needed.    Historical Provider, MD  amLODipine (NORVASC) 10 MG tablet Take 1 tablet by mouth.    Historical Provider, MD  azelastine (ASTELIN) 0.1 % nasal spray Place 1 spray into the nose.    Historical Provider, MD  Biotin 1000 MCG tablet Take 1 tablet by mouth.    Historical Provider, MD  budesonide-formoterol (SYMBICORT) 80-4.5 MCG/ACT inhaler Inhale 2 puffs into the lungs 2 (two) times daily.    Historical Provider, MD  busPIRone (BUSPAR) 10 MG tablet Take 10 mg by mouth 3 (three) times daily.    Historical Provider, MD  cholecalciferol (VITAMIN D) 1000 UNITS tablet Take 1,000 Units by mouth daily. Takes 2    Historical Provider, MD  cloNIDine (CATAPRES) 0.1 MG tablet Take 1 tablet by mouth 2 (two) times daily.     Historical Provider, MD  DULoxetine (CYMBALTA) 60 MG capsule Take 60 mg by mouth 2 (two) times daily.    Historical Provider, MD  escitalopram (LEXAPRO) 20 MG tablet Take 1 tablet by mouth every morning. 09/11/14   Historical Provider, MD  esomeprazole (NEXIUM) 40 MG packet Take 1 Dose by mouth.    Historical Provider, MD  furosemide (LASIX) 20 MG tablet Take 20 mg by mouth 2 (two) times daily.     Historical Provider, MD  gabapentin (NEURONTIN) 100 MG capsule Take 1 capsule by mouth.    Historical Provider, MD  glimepiride (AMARYL) 4 MG tablet Take 4 mg by mouth daily before breakfast.    Historical Provider, MD  HYDROcodone-acetaminophen (NORCO/VICODIN) 5-325 MG per tablet Take 1 tablet by mouth.    Historical Provider, MD  isosorbide mononitrate (IMDUR) 30 MG 24 hr tablet Take 1 tablet by mouth.    Historical Provider, MD  lamoTRIgine (LAMICTAL) 100 MG tablet Take 100 mg by mouth daily.    Historical  Provider, MD  lisinopril (PRINIVIL,ZESTRIL) 10 MG tablet Take 1 tablet by mouth.    Historical Provider, MD  meloxicam (MOBIC) 15 MG tablet Take by mouth.     Historical Provider, MD  metoprolol succinate (TOPROL-XL) 100 MG 24 hr tablet Take 100 mg by mouth daily. Take with or immediately following a meal.    Historical Provider, MD  nortriptyline (PAMELOR) 10 MG capsule Take 1 capsule by mouth.  Historical Provider, MD  omeprazole (PRILOSEC) 40 MG capsule Take 40 mg by mouth daily.    Historical Provider, MD  ondansetron (ZOFRAN-ODT) 8 MG disintegrating tablet Take 1 tablet by mouth.    Historical Provider, MD  oxyCODONE-acetaminophen (PERCOCET/ROXICET) 5-325 MG per tablet Take 1 tablet by mouth.    Historical Provider, MD  predniSONE (DELTASONE) 10 MG tablet Take 5 mg by mouth 2 (two) times daily.     Historical Provider, MD  pregabalin (LYRICA) 25 MG capsule Take 1 Dose by mouth.    Historical Provider, MD  rizatriptan (MAXALT) 10 MG tablet Take 1 tablet by mouth.    Historical Provider, MD  saxagliptin HCl (ONGLYZA) 5 MG TABS tablet Take 1 tablet by mouth.    Historical Provider, MD  sertraline (ZOLOFT) 50 MG tablet Take 50 mg by mouth daily.    Historical Provider, MD  tiZANidine (ZANAFLEX) 4 MG tablet Take 1 tablet by mouth every 8 (eight) hours as needed.    Historical Provider, MD  Tofacitinib Citrate 5 MG TABS Take 1 tablet by mouth.    Historical Provider, MD  traMADol (ULTRAM) 50 MG tablet Take 1 tablet by mouth.    Historical Provider, MD  zolpidem (AMBIEN) 10 MG tablet Take 10 mg by mouth at bedtime as needed.    Historical Provider, MD    REVIEW OF SYSTEMS:  Review of Systems  Constitutional: Negative for fever, chills, weight loss and malaise/fatigue.  HENT: Negative for ear pain, hearing loss and tinnitus.   Eyes: Negative for blurred vision, double vision, pain and redness.  Respiratory: Negative for cough, hemoptysis and shortness of breath.   Cardiovascular: Negative for  chest pain, palpitations, orthopnea and leg swelling.  Gastrointestinal: Negative for nausea, vomiting, abdominal pain, diarrhea and constipation.  Genitourinary: Negative for dysuria, frequency and hematuria.  Musculoskeletal: Positive for joint pain (left greater than right ) and falls. Negative for back pain and neck pain.  Skin:       No acne, rash, or lesions  Neurological: Positive for dizziness. Negative for tremors, focal weakness and weakness.  Endo/Heme/Allergies: Negative for polydipsia. Does not bruise/bleed easily.  Psychiatric/Behavioral: Negative for depression. The patient is not nervous/anxious and does not have insomnia.      VITAL SIGNS:   Filed Vitals:   12/24/14 0106 12/24/14 0233 12/24/14 0300  BP:  131/80 130/78  Pulse: 124 122 121  Temp: 99.3 F (37.4 C)    TempSrc: Oral    Resp: 22 20   Height: 4\' 11"  (1.499 m)    Weight: 73.936 kg (163 lb)    SpO2: 96% 97% 97%   Wt Readings from Last 3 Encounters:  12/24/14 73.936 kg (163 lb)  01/27/12 74.844 kg (165 lb)    PHYSICAL EXAMINATION:  Physical Exam  Constitutional: She is oriented to person, place, and time. She appears well-developed and well-nourished. No distress.  HENT:  Head: Normocephalic and atraumatic.  Mouth/Throat: Oropharynx is clear and moist.  Eyes: Conjunctivae and EOM are normal. Pupils are equal, round, and reactive to light. No scleral icterus.  Neck: Normal range of motion. Neck supple. No JVD present. No thyromegaly present.  Cardiovascular: Normal rate, regular rhythm and intact distal pulses.  Exam reveals no gallop and no friction rub.   No murmur heard. Respiratory: Effort normal and breath sounds normal. No respiratory distress. She has no wheezes. She has no rales.  GI: Soft. Bowel sounds are normal. She exhibits no distension. There is no tenderness.  Musculoskeletal: Normal  range of motion. She exhibits tenderness (left knee warm to touch very tender, right knee also tender).  She exhibits no edema.  No arthritis, no gout  Lymphadenopathy:    She has no cervical adenopathy.  Neurological: She is alert and oriented to person, place, and time. No cranial nerve deficit.  No dysarthria, no aphasia  Skin: Skin is warm and dry. No rash noted. No erythema.  Psychiatric: She has a normal mood and affect. Her behavior is normal. Judgment and thought content normal.    LABORATORY PANEL:   CBC  Recent Labs Lab 12/24/14 0116  WBC 7.3  HGB 13.3  HCT 39.1  PLT 219   ------------------------------------------------------------------------------------------------------------------  Chemistries   Recent Labs Lab 12/24/14 0116  NA 134*  K 2.5*  CL 91*  CO2 31  GLUCOSE 279*  BUN 15  CREATININE 0.88  CALCIUM 9.2   ------------------------------------------------------------------------------------------------------------------  Cardiac Enzymes  Recent Labs Lab 12/24/14 0116  TROPONINI 0.03   ------------------------------------------------------------------------------------------------------------------  RADIOLOGY:  Dg Chest 2 View  12/24/2014   CLINICAL DATA:  Acute onset of nausea.  Initial encounter.  EXAM: CHEST  2 VIEW  COMPARISON:  Chest radiograph performed 11/29/2012  FINDINGS: The lungs are well-aerated. There is stable elevation of the right hemidiaphragm. There is no evidence of focal opacification, pleural effusion or pneumothorax.  The heart is borderline enlarged. No acute osseous abnormalities are seen. Clips are noted within the right upper quadrant, reflecting prior cholecystectomy.  IMPRESSION: Stable elevation of the right hemidiaphragm. Borderline cardiomegaly. Lungs remain grossly clear.   Electronically Signed   By: Garald Balding M.D.   On: 12/24/2014 03:03   Ct Head Wo Contrast  12/24/2014   CLINICAL DATA:  Status post fall. Dizziness. Hit hard object. Disorientation for 2 weeks. Initial encounter.  EXAM: CT HEAD WITHOUT CONTRAST   TECHNIQUE: Contiguous axial images were obtained from the base of the skull through the vertex without intravenous contrast.  COMPARISON:  CT of the head performed 09/01/2013, and MRI of the brain performed 06/09/2012  FINDINGS: There is no evidence of acute infarction, mass lesion, or intra- or extra-axial hemorrhage on CT.  Scattered periventricular and subcortical white matter change likely reflects small vessel ischemic microangiopathy.  The posterior fossa, including the cerebellum, brainstem and fourth ventricle, is within normal limits. The third and lateral ventricles, and basal ganglia are unremarkable in appearance. The cerebral hemispheres are symmetric in appearance, with normal gray-white differentiation. No mass effect or midline shift is seen.  There is no evidence of fracture; a right occipital craniectomy defect is noted, with underlying postoperative change at the right cerebellar hemisphere. The orbits are within normal limits. The paranasal sinuses and mastoid air cells are well-aerated. No significant soft tissue abnormalities are seen.  IMPRESSION: 1. No evidence of traumatic intracranial injury or fracture. 2. Scattered small vessel ischemic microangiopathy. Postoperative change at the right cerebellar hemisphere.   Electronically Signed   By: Garald Balding M.D.   On: 12/24/2014 02:35    EKG:   Orders placed or performed during the hospital encounter of 12/24/14  . ED EKG  . ED EKG    IMPRESSION AND PLAN:  Principal Problem:   Sepsis - Trend lactic acid, fluids, broad antibiotics for now, hemodynamically stable. Continue workup of and treatment for underlying conditions as below. Active Problems:   Left knee pain - potentially septic joint, will get x-ray to rule out acute trauma with her fall, that she will likely need a joint aspiration with studies  of her joint fluid to rule out infection in the knee.   Diabetes mellitus, type II - carb controlled diet, sliding scale insulin  while inpatient.   HTN (hypertension) - continue home meds.   COPD (chronic obstructive pulmonary disease) - continue home meds.   GERD (gastroesophageal reflux disease) - PPI while here.   Depression - continue home meds.   Anxiety - continue home meds.     All the records are reviewed and case discussed with ED provider. Management plans discussed with the patient and/or family.  DVT PROPHYLAXIS: SubQ lovenox  ADMISSION STATUS: Inpatient  CODE STATUS: DO NOT RESUSCITATE  TOTAL TIME TAKING CARE OF THIS PATIENT: 45 minutes.    Corliss Lamartina Forsan 12/24/2014, 4:21 AM  [facility]  Office  [phone]  CC: Primary care physician; No primary care provider on file.

## 2014-12-24 NOTE — ED Notes (Signed)
Unable to obtain BP due to pt being agitated and unable to hold still.

## 2014-12-24 NOTE — ED Notes (Signed)
MD notified lactic acid 3.9

## 2014-12-24 NOTE — ED Notes (Signed)
MD notified lab reports K+ 2.5

## 2014-12-24 NOTE — ED Notes (Signed)
Pt presents to ER via EMS from home. Pt fell after feeling dizzy today. Pt disoriented at this time, family reports this is abnormal and has been going on for 2 weeks.

## 2014-12-24 NOTE — Progress Notes (Signed)
Pharmacy Consult - Electrolyte Management  Pharmacy consulted to monitor/supplement electrolytes in this 69 year old female admitted for sepsis  BMP Latest Ref Rng 12/24/2014 12/24/2014 11/29/2012  Glucose 65 - 99 mg/dL 290(H) 279(H) 142(H)  BUN 6 - 20 mg/dL 15 15 24(H)  Creatinine 0.44 - 1.00 mg/dL 0.87 0.88 1.26  Sodium 135 - 145 mmol/L 132(L) 134(L) 139  Potassium 3.5 - 5.1 mmol/L 2.7(LL) 2.5(LL) 4.0  Chloride 101 - 111 mmol/L 93(L) 91(L) 107  CO2 22 - 32 mmol/L 29 31 23   Calcium 8.9 - 10.3 mg/dL 8.5(L) 9.2 9.1   Patient received KCl 40 mEq PO x 1 this AM.  Plan:   Will recheck potassium and magnesium this evening and supplement as needed.   Pharmacy to follow per consult  Rexene Edison, PharmD Clinical Pharmacist 12/24/2014 2:16 PM

## 2014-12-24 NOTE — ED Provider Notes (Signed)
[facility] Emergency Department Provider Note  ____________________________________________  Time seen: Approximately 1:59 AM  I have reviewed the triage vital signs and the nursing notes.   HISTORY  Chief Complaint Weakness    HPI Meghan [name] is a 69 y.o. female with history of diabetes, hypertension, GERD, COPD presents for evaluation of 4 days of intermittent, gradual onset lightheadedness, worse with standing. Patient reports that tonight she got up from bed to get a drink, her foot slipped and she fell, hitting her head on the table. No loss of consciousness. No neck pain. She has been treated with azithromycin for persistent cough however she reports that this has not been helping. Cough is productive of whitish phlegm. No fevers, no vomiting or diarrhea, no chest pain or difficulty breathing. Current severity mild.   Past Medical History  Diagnosis Date  . COPD (chronic obstructive pulmonary disease)   . Diabetes mellitus   . Hypertension   . Depression   . Anxiety   . Arthritis     RA  . GERD (gastroesophageal reflux disease)   . Osteopenia   . DDD (degenerative disc disease)   . Obese   . PTSD (post-traumatic stress disorder)     Patient Active Problem List   Diagnosis Date Noted  . Severe bipolar disorder with psychotic features, mood-congruent 11/16/2014  . Neurosis, posttraumatic 11/16/2014  . H/O diabetes mellitus 11/16/2014  . H/O: HTN (hypertension) 11/16/2014  . H/O gastric ulcer 11/16/2014  . H/O: obesity 11/16/2014  . H/O arthritis 11/16/2014    Past Surgical History  Procedure Laterality Date  . Hand reconstruction  2011    right-multiple finger joint reconst  . Abdominal hysterectomy    . Cervical fusion    . Joint replacement      bilat knee replacements  . Cholecystectomy    . Foot arthroplasty      toes x2 rt foot  . Finger arthroplasty  02/23/2012    Procedure: FINGER ARTHROPLASTY;  Surgeon: Cammie Sickle.,  MD;  Location: [place];  Service: Orthopedics;  Laterality: Left;  Extensor carpi radialis longus to Extensor carpi ulnaris transfer, left Metaphalangeal reconstructions of index and long fingers,    Current Outpatient Rx  Name  Route  Sig  Dispense  Refill  . hydrochlorothiazide (MICROZIDE) 12.5 MG capsule   Oral   Take 12.5 mg by mouth daily.         Marland Kitchen levocetirizine (XYZAL) 5 MG tablet   Oral   Take 5 mg by mouth every evening.         . meloxicam (MOBIC) 7.5 MG tablet   Oral   Take 7.5 mg by mouth daily.         . predniSONE (DELTASONE) 5 MG tablet   Oral   Take 5 mg by mouth daily with breakfast.         . pregabalin (LYRICA) 50 MG capsule   Oral   Take 50 mg by mouth 2 (two) times daily.         . Tofacitinib Citrate 5 MG TABS   Oral   Take by mouth.         Marland Kitchen albuterol (PROVENTIL HFA;VENTOLIN HFA) 108 (90 BASE) MCG/ACT inhaler   Inhalation   Inhale 2 puffs into the lungs every 6 (six) hours as needed.         Marland Kitchen albuterol (PROVENTIL HFA;VENTOLIN HFA) 108 (90 BASE) MCG/ACT inhaler   Inhalation   Inhale  1 puff into the lungs.         Marland Kitchen alendronate (FOSAMAX) 70 MG tablet   Oral   Take 70 mg by mouth every 7 (seven) days. Take with a full glass of water on an empty stomach.         . ALPRAZolam (XANAX) 0.5 MG tablet   Oral   Take 0.5 mg by mouth as needed.         Marland Kitchen amLODipine (NORVASC) 10 MG tablet   Oral   Take 1 tablet by mouth.         Marland Kitchen azelastine (ASTELIN) 0.1 % nasal spray   Nasal   Place 1 spray into the nose.         . Biotin 1000 MCG tablet   Oral   Take 1 tablet by mouth.         . budesonide-formoterol (SYMBICORT) 80-4.5 MCG/ACT inhaler   Inhalation   Inhale 2 puffs into the lungs 2 (two) times daily.         . busPIRone (BUSPAR) 10 MG tablet   Oral   Take 10 mg by mouth 3 (three) times daily.         . cholecalciferol (VITAMIN D) 1000 UNITS tablet   Oral   Take 1,000 Units by mouth daily.  Takes 2         . cloNIDine (CATAPRES) 0.1 MG tablet   Oral   Take 1 tablet by mouth 2 (two) times daily.          . DULoxetine (CYMBALTA) 60 MG capsule   Oral   Take 60 mg by mouth 2 (two) times daily.         Marland Kitchen escitalopram (LEXAPRO) 20 MG tablet   Oral   Take 1 tablet by mouth every morning.         Marland Kitchen esomeprazole (NEXIUM) 40 MG packet   Oral   Take 1 Dose by mouth.         . furosemide (LASIX) 20 MG tablet   Oral   Take 20 mg by mouth 2 (two) times daily.          Marland Kitchen gabapentin (NEURONTIN) 100 MG capsule   Oral   Take 1 capsule by mouth.         Marland Kitchen glimepiride (AMARYL) 4 MG tablet   Oral   Take 4 mg by mouth daily before breakfast.         . HYDROcodone-acetaminophen (NORCO/VICODIN) 5-325 MG per tablet   Oral   Take 1 tablet by mouth.         . isosorbide mononitrate (IMDUR) 30 MG 24 hr tablet   Oral   Take 1 tablet by mouth.         . lamoTRIgine (LAMICTAL) 100 MG tablet   Oral   Take 100 mg by mouth daily.         Marland Kitchen lisinopril (PRINIVIL,ZESTRIL) 10 MG tablet   Oral   Take 1 tablet by mouth.         . meloxicam (MOBIC) 15 MG tablet   Oral   Take by mouth.          . metoprolol succinate (TOPROL-XL) 100 MG 24 hr tablet   Oral   Take 100 mg by mouth daily. Take with or immediately following a meal.         . nortriptyline (PAMELOR) 10 MG capsule   Oral   Take 1 capsule by mouth.         Marland Kitchen  omeprazole (PRILOSEC) 40 MG capsule   Oral   Take 40 mg by mouth daily.         . ondansetron (ZOFRAN-ODT) 8 MG disintegrating tablet   Oral   Take 1 tablet by mouth.         . oxyCODONE-acetaminophen (PERCOCET/ROXICET) 5-325 MG per tablet   Oral   Take 1 tablet by mouth.         . predniSONE (DELTASONE) 10 MG tablet   Oral   Take 5 mg by mouth 2 (two) times daily.          . pregabalin (LYRICA) 25 MG capsule   Oral   Take 1 Dose by mouth.         . rizatriptan (MAXALT) 10 MG tablet   Oral   Take 1 tablet by  mouth.         . saxagliptin HCl (ONGLYZA) 5 MG TABS tablet   Oral   Take 1 tablet by mouth.         . sertraline (ZOLOFT) 50 MG tablet   Oral   Take 50 mg by mouth daily.         Marland Kitchen tiZANidine (ZANAFLEX) 4 MG tablet   Oral   Take 1 tablet by mouth every 8 (eight) hours as needed.         . Tofacitinib Citrate 5 MG TABS   Oral   Take 1 tablet by mouth.         . traMADol (ULTRAM) 50 MG tablet   Oral   Take 1 tablet by mouth.         . zolpidem (AMBIEN) 10 MG tablet   Oral   Take 10 mg by mouth at bedtime as needed.           Allergies Gabapentin; Iodine; Naproxen; and Nsaids  Family History  Problem Relation Age of Onset  . Depression Sister   . CAD    . Hypertension    . Diabetes Mellitus II    . Arthritis      Social History History  Substance Use Topics  . Smoking status: Former Research scientist (life sciences)  . Smokeless tobacco: Not on file  . Alcohol Use: No    Review of Systems Constitutional: No fever/chills Eyes: No visual changes. ENT: No sore throat. Cardiovascular: Denies chest pain. Respiratory: Denies shortness of breath. Gastrointestinal: No abdominal pain.  + nausea, no vomiting.  No diarrhea.  No constipation. Genitourinary: Negative for dysuria. Musculoskeletal: Negative for back pain. Skin: Negative for rash. Neurological: Negative for headaches, focal weakness or numbness.  10-point ROS otherwise negative.  ____________________________________________   PHYSICAL EXAM:  VITAL SIGNS: ED Triage Vitals  Enc Vitals Group     BP --      Pulse Rate 12/24/14 0106 124     Resp 12/24/14 0106 22     Temp 12/24/14 0106 99.3 F (37.4 C)     Temp Source 12/24/14 0106 Oral     SpO2 12/24/14 0106 96 %     Weight 12/24/14 0106 163 lb (73.936 kg)     Height 12/24/14 0106 4\' 11"  (1.499 m)     Head Cir --      Peak Flow --      Pain Score 12/24/14 0105 8     Pain Loc --      Pain Edu? --      Excl. in [place]? --     Constitutional: Alert and  oriented x 4. Well  appearing and in no acute distress. Eyes: Conjunctivae are normal. PERRL. EOMI. Head: Atraumatic. Nose: No congestion/rhinnorhea. Mouth/Throat: Mucous membranes are moist.  Oropharynx non-erythematous. Neck: No stridor. Supple without meningismus. Cardiovascular: tachycardic rate, regular rhythm. Grossly normal heart sounds.  Good peripheral circulation. Respiratory: Normal respiratory effort. Mild tachypnea. Diminished breath sounds bilaterally. Gastrointestinal: Soft and nontender. No distention. No abdominal bruits. No CVA tenderness. Genitourinary: deferred Musculoskeletal: No lower extremity tenderness nor edema.  No joint effusions. Neurologic:  Normal speech and language. No gross focal neurologic deficits are appreciated. Speech is normal. No gait instability. Truncal weakness Skin:  Skin is warm, dry and intact. No rash noted. Psychiatric: Mood and affect are normal. Speech and behavior are normal.  ____________________________________________   LABS (all labs ordered are listed, but only abnormal results are displayed)  Labs Reviewed  CBC - Abnormal; Notable for the following:    MCV 78.7 (*)    RDW 15.3 (*)    All other components within normal limits  BASIC METABOLIC PANEL - Abnormal; Notable for the following:    Sodium 134 (*)    Potassium 2.5 (*)    Chloride 91 (*)    Glucose, Bld 279 (*)    All other components within normal limits  URINALYSIS COMPLETEWITH MICROSCOPIC ([facility])  - Abnormal; Notable for the following:    Color, Urine AMBER (*)    APPearance HAZY (*)    Glucose, UA 150 (*)    Protein, ur 30 (*)    Bacteria, UA FEW (*)    Squamous Epithelial / LPF 0-5 (*)    All other components within normal limits  LACTIC ACID, PLASMA - Abnormal; Notable for the following:    Lactic Acid, Venous 3.9 (*)    All other components within normal limits  GLUCOSE, CAPILLARY - Abnormal; Notable for the following:    Glucose-Capillary 248 (*)     All other components within normal limits  CULTURE, BLOOD (ROUTINE X 2)  CULTURE, BLOOD (ROUTINE X 2)  TROPONIN I  LACTIC ACID, PLASMA  CBG MONITORING, ED   ____________________________________________  EKG  ED ECG REPORT   Date: 12/24/2014  EKG Time: 01:14  Rate: 122  Rhythm: normal EKG, normal sinus rhythm, sinus tachycardia  Axis: Normal  Intervals:none  ST&T Change: No acute ST segment change, Q-wave in lead 3 and V3  ____________________________________________  RADIOLOGY  CXR: IMPRESSION: Stable elevation of the right hemidiaphragm. Borderline cardiomegaly. Lungs remain grossly clear.   CT head: IMPRESSION: 1. No evidence of traumatic intracranial injury or fracture. 2. Scattered small vessel ischemic microangiopathy. Postoperative change at the right cerebellar hemisphere. ____________________________________________   PROCEDURES  Procedure(s) performed: None  Critical Care performed: Yes, see critical care note(s) . Total critical care time spent is 45 minutes. ____________________________________________   INITIAL IMPRESSION / ASSESSMENT AND PLAN / ED COURSE  Pertinent labs & imaging results that were available during my care of the patient were reviewed by me and considered in my medical decision making (see chart for details).  TICIA VIRGO is a 69 y.o. female with history of diabetes, hypertension, GERD, COPD presents for evaluation of 4 days of intermittent, gradual onset lightheadedness, worse with standing. She had what appears to be mechanical fall tonight with head injury, no loss of consciousness, intact neurological exam. She has also had cough for which she has been prescribed azithromycin. Currently meeting 2 out of 4 Sirs criteria for tachycardia and tachypnea therefore will treat with fluids, empiric antibiotics, obtain labs, chest x-ray, CT  head, urinalysis and anticipate admission.  ----------------------------------------- 3:45 AM on  12/24/2014 ----------------------------------------- Labs notable for hypokalemia, we'll repeat potassium. Lactate elevated at 3.9. Source unclear at this point however the patient's heart rate is improving with 30 mL/kg bolus of normal saline, vancomycin, Zosyn. She is maintaining adequate blood pressure. She is mentating appropriately. Neck supple and I doubt meningismus. Discussed with hospitalist for admission.  ____________________________________________   FINAL CLINICAL IMPRESSION(S) / ED DIAGNOSES  Final diagnoses:  SIRS (systemic inflammatory response syndrome)  Hypokalemia  Cough      Joanne Gavel, MD 12/24/14 [phone]

## 2014-12-24 NOTE — Progress Notes (Signed)
[facility] [place] NAME: Meghan [name]    MR#:  [id number]  DATE OF BIRTH:  January 19, 1946  SUBJECTIVE:  Came in with fall at home. No trauma. Found to have cough and elevated lactic acid 3.9 Feels better today. Left knee mild pain. No erythema REVIEW OF SYSTEMS:    Review of Systems  Constitutional: Negative for fever, chills and weight loss.  HENT: Negative for ear discharge, ear pain and nosebleeds.   Eyes: Negative for blurred vision, pain and discharge.  Respiratory: Positive for cough. Negative for sputum production, shortness of breath, wheezing and stridor.   Cardiovascular: Negative for chest pain, palpitations, orthopnea and PND.  Gastrointestinal: Negative for nausea, vomiting, abdominal pain and diarrhea.  Genitourinary: Negative for urgency and frequency.  Musculoskeletal: Positive for joint pain. Negative for back pain.  Neurological: Negative for sensory change, speech change, focal weakness and weakness.  Psychiatric/Behavioral: Negative for depression and hallucinations. The patient is not nervous/anxious.     Tolerating Diet:yes Tolerating PT: to Evaluate  DRUG ALLERGIES:   Allergies  Allergen Reactions  . Gabapentin   . Iodine     syncopy  . Naproxen     hypotension  . Nsaids     VITALS:  Blood pressure 136/70, pulse 109, temperature 98.3 F (36.8 C), temperature source Oral, resp. rate 17, height $RemoveBe'4\' 11"'uOeMWekUp$  (1.499 m), weight 76.522 kg (168 lb 11.2 oz), SpO2 94 %.  PHYSICAL EXAMINATION:   Physical Exam  GENERAL:  69 y.o.-year-old patient lying in the bed with no acute distress.  EYES: Pupils equal, round, reactive to light and accommodation. No scleral icterus. Extraocular muscles intact.  HEENT: Head atraumatic, normocephalic. Oropharynx and nasopharynx clear.  NECK:  Supple, no jugular venous distention. No thyroid enlargement, no tenderness.  LUNGS: Normal breath sounds bilaterally, no wheezing, rales,  rhonchi. No use of accessory muscles of respiration.  CARDIOVASCULAR: S1, S2 normal. No murmurs, rubs, or gallops.  ABDOMEN: Soft, nontender, nondistended. Bowel sounds present. No organomegaly or mass.  EXTREMITIES: No cyanosis, clubbing or edema b/l.    Left knee no cellulitis but swelling on left medial part. Tender+ NEUROLOGIC: Cranial nerves II through XII are intact. No focal Motor or sensory deficits b/l.   PSYCHIATRIC: The patient is alert and oriented x 3.  SKIN: No obvious rash, lesion, or ulcer.    LABORATORY PANEL:   CBC  Recent Labs Lab 12/24/14 0529  WBC 7.8  HGB 12.4  HCT 36.7  PLT 199   ------------------------------------------------------------------------------------------------------------------  Chemistries   Recent Labs Lab 12/24/14 0529  NA 132*  K 2.7*  CL 93*  CO2 29  GLUCOSE 290*  BUN 15  CREATININE 0.87  CALCIUM 8.5*   ------------------------------------------------------------------------------------------------------------------  Cardiac Enzymes  Recent Labs Lab 12/24/14 0116  TROPONINI 0.03   ------------------------------------------------------------------------------------------------------------------  RADIOLOGY:  Dg Chest 2 View  12/24/2014   CLINICAL DATA:  Acute onset of nausea.  Initial encounter.  EXAM: CHEST  2 VIEW  COMPARISON:  Chest radiograph performed 11/29/2012  FINDINGS: The lungs are well-aerated. There is stable elevation of the right hemidiaphragm. There is no evidence of focal opacification, pleural effusion or pneumothorax.  The heart is borderline enlarged. No acute osseous abnormalities are seen. Clips are noted within the right upper quadrant, reflecting prior cholecystectomy.  IMPRESSION: Stable elevation of the right hemidiaphragm. Borderline cardiomegaly. Lungs remain grossly clear.   Electronically Signed   By: Garald Balding M.D.   On: 12/24/2014 03:03   Dg  Knee 1-2 Views Left  12/24/2014   CLINICAL  DATA:  Total knee replacement.  Initial evaluation.  EXAM: LEFT KNEE - 1-2 VIEW  COMPARISON:  None.  FINDINGS: Total left knee replacement with good anatomic alignment. Hardware intact. Small left knee joint effusion. No acute bony abnormality. Peripheral vascular calcification  IMPRESSION: Total left knee replacement with good anatomic alignment. Small left knee joint effusion.   Electronically Signed   By: Marcello Moores  Register   On: 12/24/2014 08:50   Ct Head Wo Contrast  12/24/2014   CLINICAL DATA:  Status post fall. Dizziness. Hit hard object. Disorientation for 2 weeks. Initial encounter.  EXAM: CT HEAD WITHOUT CONTRAST  TECHNIQUE: Contiguous axial images were obtained from the base of the skull through the vertex without intravenous contrast.  COMPARISON:  CT of the head performed 09/01/2013, and MRI of the brain performed 06/09/2012  FINDINGS: There is no evidence of acute infarction, mass lesion, or intra- or extra-axial hemorrhage on CT.  Scattered periventricular and subcortical white matter change likely reflects small vessel ischemic microangiopathy.  The posterior fossa, including the cerebellum, brainstem and fourth ventricle, is within normal limits. The third and lateral ventricles, and basal ganglia are unremarkable in appearance. The cerebral hemispheres are symmetric in appearance, with normal gray-white differentiation. No mass effect or midline shift is seen.  There is no evidence of fracture; a right occipital craniectomy defect is noted, with underlying postoperative change at the right cerebellar hemisphere. The orbits are within normal limits. The paranasal sinuses and mastoid air cells are well-aerated. No significant soft tissue abnormalities are seen.  IMPRESSION: 1. No evidence of traumatic intracranial injury or fracture. 2. Scattered small vessel ischemic microangiopathy. Postoperative change at the right cerebellar hemisphere.   Electronically Signed   By: Garald Balding M.D.   On:  12/24/2014 02:35     ASSESSMENT AND PLAN:  69 year old Female with multiple medical problems including Dm-2,HTn,GERD and COPD comes in after a non-traumatic mechanical fall at home. She was found to have   * Sepsis - Trend lactic acid, fluids, broad antibiotics for now, hemodynamically stable. Continue workup of and treatment for underlying conditions as below. -Etio unclear, Mild acute bronchitis vs left knee inflammartory joint vs septic arthiris -IV vanc and ZZosyn -CXR neg -UA neg -Left knee xray shows small joint effusion. Intact hardware -spoke with Dr Consuello Masse oto eval -CRP adn ESR  * Left knee pain - potentially septic joint, will get x-ray to rule out acute trauma with her fall, that she will likely need a joint aspiration with studies of her joint fluid to rule out infection in the knee. -CRP and ESR,, ortho eval pending  * Diabetes mellitus, type II - carb controlled diet, sliding scale insulin while inpatient.  * HTN (hypertension) - continue home meds.  * COPD (chronic obstructive pulmonary disease) - continue home meds.  *GERD (gastroesophageal reflux disease) - PPI   * Depression - continue home meds  * Anxiety - continue home meds.   All the records are reviewed and case discussed with Care Management/Social Workerr. Management plans discussed with the patient, family and they are in agreement.  CODE STATUS: Full  DVT Prophylaxis:lovenox  TOTAL TIME TAKING CARE OF THIS PATIENT: 35 minutes.   POSSIBLE D/C IN 1-2 DAYS, DEPENDING ON CLINICAL CONDITION.   Eythan Jayne M.D on 12/24/2014 at 4:06 PM  Between 7am to 6pm - Pager - [phone]  After 6pm go to [email or web address] - password [email or web address] [facility]  Hospitalists  Office  [phone]  CC: Primary care physician; No primary care provider on file.

## 2014-12-24 NOTE — Progress Notes (Signed)
ANTIBIOTIC CONSULT NOTE - INITIAL  Pharmacy Consult for Vancomycin and Zosyn Indication: rule out sepsis  Allergies  Allergen Reactions  . Gabapentin   . Iodine     syncopy  . Naproxen     hypotension  . Nsaids     Patient Measurements: Height: 4\' 11"  (149.9 cm) Weight: 168 lb 11.2 oz (76.522 kg) IBW/kg (Calculated) : 43.2 Adjusted Body Weight: 56.9  Vital Signs: Temp: 98.9 F (37.2 C) (05/16 0540) Temp Source: Oral (05/16 0540) BP: 125/72 mmHg (05/16 0540) Pulse Rate: 116 (05/16 0540) Intake/Output from previous day:   Intake/Output from this shift:    Labs:  Recent Labs  12/24/14 0116 12/24/14 0529  WBC 7.3 7.8  HGB 13.3 12.4  PLT 219 199  CREATININE 0.88  --    Estimated Creatinine Clearance: 54.6 mL/min (by C-G formula based on Cr of 0.88). No results for input(s): VANCOTROUGH, VANCOPEAK, VANCORANDOM, GENTTROUGH, GENTPEAK, GENTRANDOM, TOBRATROUGH, TOBRAPEAK, TOBRARND, AMIKACINPEAK, AMIKACINTROU, AMIKACIN in the last 72 hours.   Microbiology: No results found for this or any previous visit (from the past 720 hour(s)).  Medical History: Past Medical History  Diagnosis Date  . COPD (chronic obstructive pulmonary disease)   . Diabetes mellitus   . Hypertension   . Depression   . Anxiety   . Arthritis     RA  . GERD (gastroesophageal reflux disease)   . Osteopenia   . DDD (degenerative disc disease)   . Obese   . PTSD (post-traumatic stress disorder)     Medications:  Scheduled:  . cloNIDine  0.1 mg Oral BID  . enoxaparin (LOVENOX) injection  40 mg Subcutaneous Q24H  . escitalopram  20 mg Oral Daily  . hydrochlorothiazide  12.5 mg Oral Daily  . insulin aspart  0-5 Units Subcutaneous QHS  . insulin aspart  0-9 Units Subcutaneous TID WC  . lamoTRIgine  100 mg Oral BID  . nortriptyline  30 mg Oral QHS  . pantoprazole  40 mg Oral Daily  . piperacillin-tazobactam  3.375 g Intravenous Q8H  . pregabalin  50 mg Oral BID  . sodium chloride  3 mL  Intravenous Q12H  . Tofacitinib Citrate  5 mg Oral BID  . vancomycin  750 mg Intravenous Q18H   Infusions:  . sodium chloride     Assessment: Empiric abx for sepsis.  Goal of Therapy:  Vancomycin trough level 15-20 mcg/ml  Plan:  1. Vancomycin 1000 mg iv once then 750 mg iv q 18 h with stacked dosing and a trough with the 4th dose.   2. Zosyn 3.375 g iv once then 3.375 g EI q 8 h.   F/U culture results.   Ulice Dash D 12/24/2014,5:51 AM

## 2014-12-24 NOTE — Consult Note (Signed)
ORTHOPAEDIC CONSULTATION  REQUESTING PHYSICIAN: Fritzi Mandes, MD  Chief Complaint: Bilateral knee pain, left greater than right  HPI: Meghan [name] is a 69 y.o. female who complains of  bilateral knee pain. She had a previous fall several weeks ago and then again last night. She states that the right knee gave way, causing her to fall. She has apparently had a several day history of bilateral knee pain. She denies any fevers, chills, night sweats, or other constitutional symptoms. She apparently presented to the emergency department somewhat disoriented, and does not recall many of the events prior to admission. She has a remote history of bilateral total knee arthroplasties performed by Dr. Earnestine Leys. She has apparently not had any recent follow-up for the knees. She was apparently able to ambulate to the bathroom today. She reports some difficulty with knee flexion due to discomfort.  Past Medical History  Diagnosis Date  . COPD (chronic obstructive pulmonary disease)   . Diabetes mellitus   . Hypertension   . Depression   . Anxiety   . Arthritis     RA  . GERD (gastroesophageal reflux disease)   . Osteopenia   . DDD (degenerative disc disease)   . Obese   . PTSD (post-traumatic stress disorder)    Past Surgical History  Procedure Laterality Date  . Hand reconstruction  2011    right-multiple finger joint reconst  . Abdominal hysterectomy    . Cervical fusion    . Joint replacement      bilat knee replacements  . Cholecystectomy    . Foot arthroplasty      toes x2 rt foot  . Finger arthroplasty  02/23/2012    Procedure: FINGER ARTHROPLASTY;  Surgeon: Cammie Sickle., MD;  Location: [facility];  Service: Orthopedics;  Laterality: Left;  Extensor carpi radialis longus to Extensor carpi ulnaris transfer, left Metaphalangeal reconstructions of index and long fingers,   History   Social History  . Marital Status: Divorced    Spouse Name: N/A  . Number of  Children: N/A  . Years of Education: N/A   Social History Main Topics  . Smoking status: Former Research scientist (life sciences)  . Smokeless tobacco: Not on file  . Alcohol Use: No  . Drug Use: Not on file  . Sexual Activity: Not on file   Other Topics Concern  . None   Social History Narrative   Family History  Problem Relation Age of Onset  . Depression Sister   . CAD    . Hypertension    . Diabetes Mellitus II    . Arthritis     Allergies  Allergen Reactions  . Gabapentin   . Iodine     syncopy  . Naproxen     hypotension  . Nsaids    Prior to Admission medications   Medication Sig Start Date End Date Taking? Authorizing Provider  albuterol (PROVENTIL HFA;VENTOLIN HFA) 108 (90 BASE) MCG/ACT inhaler Inhale 2 puffs into the lungs every 6 (six) hours as needed.   Yes Historical Provider, MD  alendronate (FOSAMAX) 70 MG tablet Take 70 mg by mouth every 7 (seven) days. Take with a full glass of water on an empty stomach.   Yes Historical Provider, MD  azelastine (ASTELIN) 0.1 % nasal spray Place 1 spray into the nose.   Yes Historical Provider, MD  budesonide-formoterol (SYMBICORT) 80-4.5 MCG/ACT inhaler Inhale 2 puffs into the lungs 2 (two) times daily.   Yes Historical Provider, MD  cetirizine (  ZYRTEC) 10 MG tablet Take 10 mg by mouth daily as needed for allergies.   Yes Historical Provider, MD  cloNIDine (CATAPRES) 0.1 MG tablet Take 1 tablet by mouth 2 (two) times daily.    Yes Historical Provider, MD  diphenhydrAMINE (SOMINEX) 25 MG tablet Take 25 mg by mouth as needed for allergies or sleep.   Yes Historical Provider, MD  escitalopram (LEXAPRO) 20 MG tablet Take 1 tablet by mouth every morning. 09/11/14  Yes Historical Provider, MD  esomeprazole (NEXIUM) 40 MG packet Take 1 Dose by mouth.   Yes Historical Provider, MD  GUAIFENESIN PO Take 400 mg by mouth as needed (for chest congestion).   Yes Historical Provider, MD  hydrochlorothiazide (MICROZIDE) 12.5 MG capsule Take 12.5 mg by mouth daily.    Yes Historical Provider, MD  isosorbide mononitrate (IMDUR) 30 MG 24 hr tablet Take 1 tablet by mouth daily.    Yes Historical Provider, MD  levocetirizine (XYZAL) 5 MG tablet Take 5 mg by mouth daily.    Yes Historical Provider, MD  loperamide (IMODIUM) 2 MG capsule Take 2 mg by mouth as needed for diarrhea or loose stools.   Yes Historical Provider, MD  loratadine (CLARITIN) 10 MG tablet Take 10 mg by mouth daily as needed for allergies.   Yes Historical Provider, MD  meloxicam (MOBIC) 7.5 MG tablet Take 7.5 mg by mouth daily.   Yes Historical Provider, MD  nortriptyline (PAMELOR) 10 MG capsule Take 3 capsules by mouth at bedtime.    Yes Historical Provider, MD  oxyCODONE-acetaminophen (PERCOCET/ROXICET) 5-325 MG per tablet Take 1 tablet by mouth.   Yes Historical Provider, MD  Phenylephrine-Acetaminophen 5-325 MG TABS Take 1 tablet by mouth as needed (for nasal congestin and sinus pain).   Yes Historical Provider, MD  predniSONE (DELTASONE) 5 MG tablet Take 5 mg by mouth daily.    Yes Historical Provider, MD  pregabalin (LYRICA) 50 MG capsule Take 50 mg by mouth 2 (two) times daily.   Yes Historical Provider, MD  saxagliptin HCl (ONGLYZA) 5 MG TABS tablet Take 1 tablet by mouth.   Yes Historical Provider, MD  Tofacitinib Citrate 5 MG TABS Take 1 tablet by mouth 2 (two) times daily.    Yes Historical Provider, MD   Positive ROS: All other systems have been reviewed and were otherwise negative with the exception of those mentioned in the HPI and as above.  Physical Exam: General: Alert, awake, and in no acute distress Cardiovascular: Regular rate and rhythm with normal S1 and S2. No appreciable murmurs, gallops, or rubs. Homans test is negative. No pedal edema Respiratory: Clear to auscultation bilaterally. GI: Abdomen is soft, nontender, nondistended. Skin: No erythema or rashes to the lower extremities Neurologic: Sensation intact distally. Motor strength is grossly 5/5. No clonus or  tremor. Psychiatric: Patient is competent for consent with normal mood and affect Lymphatic: No axillary or cervical lymphadenopathy  MUSCULOSKELETAL: Semination the upper extremities demonstrates deformities to the hands consistent with rheumatoid arthritis. Semination the lower extremities demonstrates good range of motion of the hips and ankles.  Examination of the right knee demonstrates some mild soft tissue swelling but no gross effusion. No erythema or increased warmth. Good patellar tracking is noted. The knee is stable to varus and valgus stress. Patient demonstrates approximately 90 of flexion. Nonspecific tenderness is noted.  Examination of the left knee demonstrates mild soft tissue swelling but no gross effusion. No erythema or increased warmth. Good patellar tracking is noted. The left knee  is stable to varus and valgus stress. Approximately 90 of flexion is appreciated. Nonspecific tenderness is noted. The patient tolerates both active and passive range of motion of both knees well.  X-rays: AP and lateral radiographs of the left knee from [facility] were reviewed. Total knee components are in place and consistent with an LCS system. No evidence of loosening or fracture. No significant polyethylene wear.  Assessment: Status post bilateral total knee arthroplasties Nonspecific bilateral knee pain  Plan: The patient has been afebrile throughout her hospitalization. Serial white blood cell counts have been within normal limits. The sedimentation rate is slightly elevated, although the patient does have a history of rheumatoid arthritis. C-reactive protein is pending. Blood cultures have been negative thus far. It is difficult to determine if the knee pain and swelling may be related to the falls. Her clinical picture is not suggestive of periprosthetic infections. I would agree with antibiotic coverage pending the final results on the blood cultures. I will order  cold therapy to the knees to decrease swelling. She will also benefit from physical therapy.  James P. Holley Bouche M.D.

## 2014-12-25 LAB — MAGNESIUM: MAGNESIUM: 1.9 mg/dL (ref 1.7–2.4)

## 2014-12-25 LAB — POTASSIUM
POTASSIUM: 3.4 mmol/L — AB (ref 3.5–5.1)
Potassium: 2.4 mmol/L — CL (ref 3.5–5.1)

## 2014-12-25 LAB — BASIC METABOLIC PANEL
ANION GAP: 7 (ref 5–15)
BUN: 11 mg/dL (ref 6–20)
CHLORIDE: 100 mmol/L — AB (ref 101–111)
CO2: 28 mmol/L (ref 22–32)
Calcium: 7.7 mg/dL — ABNORMAL LOW (ref 8.9–10.3)
Creatinine, Ser: 0.74 mg/dL (ref 0.44–1.00)
GFR calc non Af Amer: >60 mL/min (ref >60)
Glucose, Bld: 196 mg/dL — ABNORMAL HIGH (ref 65–99)
POTASSIUM: 2.8 mmol/L — AB (ref 3.5–5.1)
SODIUM: 135 mmol/L (ref 135–145)

## 2014-12-25 LAB — GLUCOSE, CAPILLARY
GLUCOSE-CAPILLARY: 188 mg/dL — AB (ref 65–99)
Glucose-Capillary: 256 mg/dL — ABNORMAL HIGH (ref 65–99)

## 2014-12-25 MED ORDER — POTASSIUM CHLORIDE CRYS ER 20 MEQ PO TBCR
40.0000 meq | EXTENDED_RELEASE_TABLET | Freq: Once | ORAL | Status: AC
  Administered 2014-12-25: 40 meq via ORAL
  Filled 2014-12-25: qty 2

## 2014-12-25 MED ORDER — AMOXICILLIN-POT CLAVULANATE 875-125 MG PO TABS
1.0000 | ORAL_TABLET | Freq: Two times a day (BID) | ORAL | Status: DC
  Administered 2014-12-25: 1 via ORAL
  Filled 2014-12-25: qty 1

## 2014-12-25 MED ORDER — POTASSIUM CHLORIDE 10 MEQ/100ML IV SOLN
10.0000 meq | INTRAVENOUS | Status: AC
  Administered 2014-12-25 (×3): 10 meq via INTRAVENOUS
  Filled 2014-12-25 (×4): qty 100

## 2014-12-25 MED ORDER — GUAIFENESIN-DM 100-10 MG/5ML PO SYRP
5.0000 mL | ORAL_SOLUTION | Freq: Once | ORAL | Status: AC
  Administered 2014-12-25: 5 mL via ORAL
  Filled 2014-12-25: qty 5

## 2014-12-25 MED ORDER — AMOXICILLIN-POT CLAVULANATE 875-125 MG PO TABS: 1.0000 | ORAL_TABLET | Freq: Two times a day (BID) | ORAL | Status: DC

## 2014-12-25 MED ORDER — POTASSIUM PHOSPHATES 15 MMOLE/5ML IV SOLN: 40.0000 meq | Freq: Once | INTRAVENOUS | Status: DC

## 2014-12-25 NOTE — Care Management (Signed)
Orders for outpatient PT placed per MD telephone order following PT recommendations. Kandice Robinsons Dori at physical therapy to confirm order showing in system.

## 2014-12-25 NOTE — Progress Notes (Signed)
Pt A and O x 4. VSS. Pt voiding with no difficulties. Pt had no BM this shift. Pt ambulating with standby assist Pt had minimal complaints of pain with meds not requested. Pt tolerating det well, no skin issues. Pt had IV removed and prescriptions given. Pt voiced that she understood discharge instructions with no further questions. Pt left via wheelchair with auxillary.

## 2014-12-25 NOTE — Progress Notes (Signed)
Inpatient Diabetes Program Recommendations  AACE/ADA: New Consensus Statement on Inpatient Glycemic Control (2013)  Target Ranges:  Prepandial:   less than 140 mg/dL      Peak postprandial:   less than 180 mg/dL (1-2 hours)      Critically ill patients:  140 - 180 mg/dL   Results for Meghan [name], Meghan [name] (MRN [id number]) as of 12/25/2014 08:14  Ref. Range 12/24/2014 07:37 12/24/2014 11:53 12/24/2014 16:30 12/24/2014 21:14 12/25/2014 07:39  Glucose-Capillary Latest Ref Range: 65-99 mg/dL 245 (H) 265 (H) 178 (H) 310 (H) 188 (H)    Reason for Visit: elevated CBG  Diabetes history: Type 2 Outpatient Diabetes medications: Amaryl 4mg /day, Onglyza 5mg  /day Current orders for Inpatient glycemic control: Novolog correction 0-9 tid with meals and 0-5 units at hs  Blood sugars remain elevated despite Novolog correction.  Please consider starting her on 8 units Lantus (0.1unit/kg) q day, starting this am- fasting blood sugar 188mg ,dl this am.   Gentry Fitz, RN, [place], , CDE Diabetes Coordinator Inpatient Diabetes Program  [phone] (Team Pager) [phone] ([place]) 12/25/2014 8:18 AM

## 2014-12-25 NOTE — Evaluation (Signed)
Physical Therapy Evaluation Patient Details Name: Meghan [name] MRN: [id number] DOB: 11-Jan-1946 Today's Date: 12/25/2014   History of Present Illness  presented to ER status post fall in home environment secondary to "R knee catching"; admitted with sepsis of unknown etiology.  Question of PNA, UTI vs. septic joint; all work-up currently negative.  Clinical Impression  Upon evaluation, patient alert and oriented to all information; follows all commands and demonstrates appropriate safety awareness/insight.  Bilat UE/LE strength and ROM grossly WFL for basic transfers and gait, but end-range bilat knee flexion and resistance testing limited by pain (7/10).  Mild knee joint effusion noted L > R knee.  Patient reports occasional 'catching' of R knee which often causes LOB or fall; not noted this session.  Able to complete all mobility without assist device, close sup/cga; significant trendelenburg pattern noted with excessive L lateral trunk flexion in L LE stance.  LOB x1 with turn management when approaching seating surface, cga, for recovery.  Gait improved to mod indep with use of RW; marked improvement in symmetry, fluidity and overall safety.  Recommend continued use upon discharge until knee pain resolved. Patient voiced understanding, though begrudgingly. Would benefit from skilled PT to address above deficits and promote optimal return to PLOF; recommend transition to home with outpatient PT services as appropriate.    Follow Up Recommendations Outpatient PT    Equipment Recommendations       Recommendations for Other Services       Precautions / Restrictions Precautions Precautions: Fall Restrictions Weight Bearing Restrictions: No      Mobility  Bed Mobility Overal bed mobility: Independent                Transfers Overall transfer level: Modified independent               General transfer comment: excessive weight shift to L  LE  Ambulation/Gait Ambulation/Gait assistance: Supervision;Min guard Ambulation Distance (Feet): 220 Feet       Gait velocity interpretation: <1.8 ft/sec, indicative of risk for recurrent falls General Gait Details: reciprocal stepping pattern with compensated trendelenburg in L LE stance.  Decreased weight shift/stance time to R LE.  Mild LOB x1 with turn negotiation when approaching seating surface, cga for recovery.  Stairs            Wheelchair Mobility    Modified Rankin (Stroke Patients Only)       Balance Overall balance assessment: Needs assistance Sitting-balance support: No upper extremity supported Sitting balance-Leahy Scale: Normal     Standing balance support: No upper extremity supported Standing balance-Leahy Scale: Fair                               Pertinent Vitals/Pain Pain Assessment: 0-10 Pain Score: 7  Pain Location: L knee and R knee Pain Descriptors / Indicators: Aching;Constant Pain Intervention(s): Monitored during session;Repositioned    Home Living Family/patient expects to be discharged to:: Private residence Living Arrangements:  (lives with grandson (works outside of home); daughter visits/checks in while grandson awa) Available Help at Discharge: Family Type of Home: House Home Access:  (1+1 curb/steps to enter)     Home Layout: One level Home Equipment: Environmental consultant - 2 wheels;Cane - single point      Prior Function Level of Independence: Independent         Comments: Indep with all household/community activities; + driving.  Does report multiple fall history, often due to "  R knee catching", unable to quantify.     Hand Dominance        Extremity/Trunk Assessment   Upper Extremity Assessment: Overall WFL for tasks assessed           Lower Extremity Assessment: Overall WFL for tasks assessed (strength at least 4+/5 bilat LEs; ROM grossly WFL, at least 100-110 degrees of flexion bilat.  Mild joint  effusion noted L > R.)         Communication   Communication: No difficulties  Cognition Arousal/Alertness: Awake/alert Behavior During Therapy: WFL for tasks assessed/performed Overall Cognitive Status: Within Functional Limits for tasks assessed                      General Comments      Exercises Other Exercises Other Exercises: Toilet transfer, ambulatory without assist device, sup/mod indep.  Impulsive wtih all movement transitions. Other Exercises: Gait x50' with RW, mod indep--improved gait symmetry and fluidity; improved balance and overall safety.  Recommend continued use of RW until bilat knee pain resolves.  Patient voiced understanding, but begrudgingly (10 minutes)      Assessment/Plan    PT Assessment Patient needs continued PT services  PT Diagnosis Difficulty walking;Acute pain   PT Problem List Decreased strength;Decreased balance;Decreased mobility;Decreased range of motion  PT Treatment Interventions DME instruction;Gait training;Stair training;Functional mobility training;Therapeutic activities;Therapeutic exercise;Manual techniques;Patient/family education;Balance training   PT Goals (Current goals can be found in the Care Plan section) Acute Rehab PT Goals Patient Stated Goal: to go home PT Goal Formulation: With patient Time For Goal Achievement: 01/08/15 Potential to Achieve Goals: Good    Frequency Min 2X/week   Barriers to discharge        Co-evaluation               End of Session Equipment Utilized During Treatment: Gait belt Activity Tolerance: Patient tolerated treatment well Patient left: in chair;with call bell/phone within reach;with chair alarm set           Time: 0211-1735 PT Time Calculation (min) (ACUTE ONLY): 29 min   Charges:   PT Evaluation $Initial PT Evaluation Tier I: 1 Procedure PT Treatments $Therapeutic Activity: 8-22 mins   PT G Codes:       Brasen Bundren H. Owens Shark, PT, DPT 12/25/2014, 12:20  PM [phone]

## 2014-12-25 NOTE — Discharge Instructions (Signed)
Notify MD for any worsening signs of infection, fever of 100.4 or higher, chills, or change in mental status.  Be sure to take the full regimen of antibiotics.  Keep your follow-up appointment.

## 2014-12-25 NOTE — Discharge Summary (Addendum)
[facility] NAME: Meghan [name]    MR#:  [id number]  DATE OF BIRTH:  09-04-1945  DATE OF ADMISSION:  12/24/2014 ADMITTING PHYSICIAN: Lance Coon, MD  DATE OF DISCHARGE: 12/25/2014  PRIMARY CARE PHYSICIAN: Dr Clayborn Bigness   ADMISSION DIAGNOSIS:  Cough [R05] Hypokalemia [E87.6]   DISCHARGE DIAGNOSIS:  *Acute mild Bronchitis/URI congestion * left knee pain   SECONDARY DIAGNOSIS:   Past Medical History  Diagnosis Date  . COPD (chronic obstructive pulmonary disease)   . Diabetes mellitus   . Hypertension   . Depression   . Anxiety   . Arthritis     RA  . GERD (gastroesophageal reflux disease)   . Osteopenia   . DDD (degenerative disc disease)   . Obese   . PTSD (post-traumatic stress disorder)     HOSPITAL COURSE:   69 year old Female with multiple medical problems including Dm-2,HTn,GERD and COPD comes in after a non-traumatic mechanical fall at home. She was found to have  * Sepsis - presented with cough, left knee pain ad found to have elevated lactic acid -recvd fluids -hemodynamically stable. -Etio unclear, Mild acute bronchitis vs left knee inflammartory joint -pt was on IV vanc and Zosyn--->change to po Augmentin -CXR neg -UA neg, BC negative -Left knee xray shows small joint effusion. Intact hardware evaluatd by Dr Marry Guan. Per him does ot appear tobe infected. Pt advised to keep an eye on it- -mild elevated CRP and ESR -no fever, wbc 7.8  * Left knee pain appears non infectious per ortho eval -cold packs and prn pain meds -CRP and ESR mildly elevated   * Diabetes mellitus, type II - carb controlled diet, sliding scale insulin while inpatient. And cont po meds -Recommend insulin to be started by PCP if sugars remains uncontrolled  * Hypokalemia pt recvd IV and po KCl. Mag normal  * HTN (hypertension) - continue home meds.  * COPD (chronic obstructive pulmonary disease) - continue home  meds.  *GERD (gastroesophageal reflux disease) - PPI   * Depression - continue home meds  * Anxiety - continue home meds.   DISCHARGE CONDITIONS:  fair  CONSULTS OBTAINED:   Ortho DR Dereck Leep, MD  DRUG ALLERGIES:   Allergies  Allergen Reactions  . Gabapentin   . Iodine     syncopy  . Naproxen     hypotension  . Nsaids     DISCHARGE MEDICATIONS:   Current Discharge Medication List    START taking these medications   Details  amoxicillin-clavulanate (AUGMENTIN) 875-125 MG per tablet Take 1 tablet by mouth every 12 (twelve) hours. Qty: 10 tablet, Refills: 0      CONTINUE these medications which have NOT CHANGED   Details  albuterol (PROVENTIL HFA;VENTOLIN HFA) 108 (90 BASE) MCG/ACT inhaler Inhale 2 puffs into the lungs every 6 (six) hours as needed.    alendronate (FOSAMAX) 70 MG tablet Take 70 mg by mouth every 7 (seven) days. Take with a full glass of water on an empty stomach.    azelastine (ASTELIN) 0.1 % nasal spray Place 1 spray into the nose.    budesonide-formoterol (SYMBICORT) 80-4.5 MCG/ACT inhaler Inhale 2 puffs into the lungs 2 (two) times daily.    cetirizine (ZYRTEC) 10 MG tablet Take 10 mg by mouth daily as needed for allergies.    cloNIDine (CATAPRES) 0.1 MG tablet Take 1 tablet by mouth 2 (two) times daily.     diphenhydrAMINE [place])  25 MG tablet Take 25 mg by mouth as needed for allergies or sleep.    escitalopram (LEXAPRO) 20 MG tablet Take 1 tablet by mouth every morning.    esomeprazole (NEXIUM) 40 MG packet Take 1 Dose by mouth.    GUAIFENESIN PO Take 400 mg by mouth as needed (for chest congestion).    hydrochlorothiazide (MICROZIDE) 12.5 MG capsule Take 12.5 mg by mouth daily.    isosorbide mononitrate (IMDUR) 30 MG 24 hr tablet Take 1 tablet by mouth daily.     levocetirizine (XYZAL) 5 MG tablet Take 5 mg by mouth daily.     loperamide (IMODIUM) 2 MG capsule Take 2 mg by mouth as needed for diarrhea or loose stools.     loratadine (CLARITIN) 10 MG tablet Take 10 mg by mouth daily as needed for allergies.    meloxicam (MOBIC) 7.5 MG tablet Take 7.5 mg by mouth daily.    nortriptyline (PAMELOR) 10 MG capsule Take 3 capsules by mouth at bedtime.     oxyCODONE-acetaminophen (PERCOCET/ROXICET) 5-325 MG per tablet Take 1 tablet by mouth.    Phenylephrine-Acetaminophen 5-325 MG TABS Take 1 tablet by mouth as needed (for nasal congestin and sinus pain).    predniSONE (DELTASONE) 5 MG tablet Take 5 mg by mouth daily.     pregabalin (LYRICA) 50 MG capsule Take 50 mg by mouth 2 (two) times daily.    saxagliptin HCl (ONGLYZA) 5 MG TABS tablet Take 1 tablet by mouth.    Tofacitinib Citrate 5 MG TABS Take 1 tablet by mouth 2 (two) times daily.       STOP taking these medications     ALPRAZolam (XANAX) 0.5 MG tablet         If you experience worsening of your admission symptoms, develop shortness of breath, life threatening emergency, suicidal or homicidal thoughts you must seek medical attention immediately by calling 911 or calling your MD immediately  if symptoms less severe.  You Must read complete instructions/literature along with all the possible adverse reactions/side effects for all the Medicines you take and that have been prescribed to you. Take any new Medicines after you have completely understood and accept all the possible adverse reactions/side effects.   Please note  You were cared for by a hospitalist during your hospital stay. If you have any questions about your discharge medications or the care you received while you were in the hospital after you are discharged, you can call the unit and asked to speak with the hospitalist on call if the hospitalist that took care of you is not available. Once you are discharged, your primary care physician will handle any further medical issues. Please note that NO REFILLS for any discharge medications will be authorized once you are discharged, as it is  imperative that you return to your primary care physician (or establish a relationship with a primary care physician if you do not have one) for your aftercare needs so that they can reassess your need for medications and monitor your lab values.    Today   SUBJECTIVE   Left knee pain much better. Cold packs helps. Cough getting better. Able to ambulate   VITAL SIGNS:  Blood pressure 118/59, pulse 98, temperature 97.8 F (36.6 C), temperature source Oral, resp. rate 18, height 4' 11"  (1.499 m), weight 76.522 kg (168 lb 11.2 oz), SpO2 99 %.  I/O:   Intake/Output Summary (Last 24 hours) at 12/25/14 1137 Last data filed at 12/25/14 1120  Gross  per 24 hour  Intake   3500 ml  Output   1125 ml  Net   2375 ml    PHYSICAL EXAMINATION:  GENERAL:  69 y.o.-year-old patient lying in the bed with no acute distress.  EYES: Pupils equal, round, reactive to light and accommodation. No scleral icterus. Extraocular muscles intact.  HEENT: Head atraumatic, normocephalic. Oropharynx and nasopharynx clear.  NECK:  Supple, no jugular venous distention. No thyroid enlargement, no tenderness.  LUNGS: Normal breath sounds bilaterally, no wheezing, rales,rhonchi or crepitation. No use of accessory muscles of respiration.  CARDIOVASCULAR: S1, S2 normal. No murmurs, rubs, or gallops.  ABDOMEN: Soft, non-tender, non-distended. Bowel sounds present. No organomegaly or mass.  EXTREMITIES: No pedal edema, cyanosis, or clubbing. Minimal tenderness in left medial part of the left knee. No erythema noted NEUROLOGIC: Cranial nerves II through XII are intact. Muscle strength 5/5 in all extremities. Sensation intact. Gait not checked.  PSYCHIATRIC: The patient is alert and oriented x 3.  SKIN: No obvious rash, lesion, or ulcer.   DATA REVIEW:   CBC  Recent Labs Lab 12/24/14 0529  WBC 7.8  HGB 12.4  HCT 36.7  PLT 199    Chemistries   Recent Labs Lab 12/25/14 0532  NA 135  K 2.8*  CL 100*  CO2 28   GLUCOSE 196*  BUN 11  CREATININE 0.74  CALCIUM 7.7*  MG 1.9    Cardiac Enzymes  Recent Labs Lab 12/24/14 0116  TROPONINI 0.03    Microbiology Results   Recent Results (from the past 240 hour(s))  Blood culture (routine x 2)     Status: None (Preliminary result)   Collection Time: 12/24/14  2:37 AM  Result Value Ref Range Status   Specimen Description BLOOD  Final   Special Requests SET 1  Final   Culture NO GROWTH 1 DAY  Final   Report Status PENDING  Incomplete  Blood culture (routine x 2)     Status: None (Preliminary result)   Collection Time: 12/24/14  2:38 AM  Result Value Ref Range Status   Specimen Description BLOOD  Final   Special Requests SET2  Final   Culture NO GROWTH 1 DAY  Final   Report Status PENDING  Incomplete    RADIOLOGY:  Dg Chest 2 View  12/24/2014   CLINICAL DATA:  Acute onset of nausea.  Initial encounter.  EXAM: CHEST  2 VIEW  COMPARISON:  Chest radiograph performed 11/29/2012  FINDINGS: The lungs are well-aerated. There is stable elevation of the right hemidiaphragm. There is no evidence of focal opacification, pleural effusion or pneumothorax.  The heart is borderline enlarged. No acute osseous abnormalities are seen. Clips are noted within the right upper quadrant, reflecting prior cholecystectomy.  IMPRESSION: Stable elevation of the right hemidiaphragm. Borderline cardiomegaly. Lungs remain grossly clear.   Electronically Signed   By: Garald Balding M.D.   On: 12/24/2014 03:03   Dg Knee 1-2 Views Left  12/24/2014   CLINICAL DATA:  Total knee replacement.  Initial evaluation.  EXAM: LEFT KNEE - 1-2 VIEW  COMPARISON:  None.  FINDINGS: Total left knee replacement with good anatomic alignment. Hardware intact. Small left knee joint effusion. No acute bony abnormality. Peripheral vascular calcification  IMPRESSION: Total left knee replacement with good anatomic alignment. Small left knee joint effusion.   Electronically Signed   By: Marcello Moores  Register    On: 12/24/2014 08:50   Ct Head Wo Contrast  12/24/2014   CLINICAL DATA:  Status post  fall. Dizziness. Hit hard object. Disorientation for 2 weeks. Initial encounter.  EXAM: CT HEAD WITHOUT CONTRAST  TECHNIQUE: Contiguous axial images were obtained from the base of the skull through the vertex without intravenous contrast.  COMPARISON:  CT of the head performed 09/01/2013, and MRI of the brain performed 06/09/2012  FINDINGS: There is no evidence of acute infarction, mass lesion, or intra- or extra-axial hemorrhage on CT.  Scattered periventricular and subcortical white matter change likely reflects small vessel ischemic microangiopathy.  The posterior fossa, including the cerebellum, brainstem and fourth ventricle, is within normal limits. The third and lateral ventricles, and basal ganglia are unremarkable in appearance. The cerebral hemispheres are symmetric in appearance, with normal gray-white differentiation. No mass effect or midline shift is seen.  There is no evidence of fracture; a right occipital craniectomy defect is noted, with underlying postoperative change at the right cerebellar hemisphere. The orbits are within normal limits. The paranasal sinuses and mastoid air cells are well-aerated. No significant soft tissue abnormalities are seen.  IMPRESSION: 1. No evidence of traumatic intracranial injury or fracture. 2. Scattered small vessel ischemic microangiopathy. Postoperative change at the right cerebellar hemisphere.   Electronically Signed   By: Garald Balding M.D.   On: 12/24/2014 02:35       Management plans discussed with the patient, family and they are in agreement.  CODE STATUS:     Code Status Orders        Start     Ordered   12/24/14 0528  Do not attempt resuscitation (DNR)   Continuous    Question Answer Comment  In the event of cardiac or respiratory ARREST Do not call a "code blue"   In the event of cardiac or respiratory ARREST Do not perform Intubation, CPR,  defibrillation or ACLS   In the event of cardiac or respiratory ARREST Use medication by any route, position, wound care, and other measures to relive pain and suffering. May use oxygen, suction and manual treatment of airway obstruction as needed for comfort.      12/24/14 0527      TOTAL TIME TAKING CARE OF THIS PATIENT: 40 minutes.    Landynn Dupler M.D on 12/25/2014 at 11:37 AM  Between 7am to 6pm - Pager - [phone] After 6pm go to [email or web address] - password [email or web address] [facility]  Office  [phone]  CC: Primary care physician; Dr Clayborn Bigness

## 2014-12-25 NOTE — Progress Notes (Signed)
Dr. Reece Levy notified of critical K+ 2.8. Ordered for K+ 48mEq once IV.

## 2014-12-25 NOTE — Care Management (Signed)
Spoke with patient for discharge planning. Patient is from home with grandson and drives self to appointments and grocery store. Has an adult daughter that helps her as well. Stated has walker but seldom uses it. Can afford medications. PCP is Dr Clayborn Bigness. No CM needs identified.

## 2014-12-29 LAB — CULTURE, BLOOD (ROUTINE X 2)
CULTURE: NO GROWTH
Culture: NO GROWTH

## 2015-01-03 ENCOUNTER — Ambulatory Visit: Payer: Medicare Other | Admitting: Physical Therapy

## 2015-01-15 DIAGNOSIS — M4806 Spinal stenosis, lumbar region: Secondary | ICD-10-CM | POA: Diagnosis not present

## 2015-01-15 DIAGNOSIS — M4316 Spondylolisthesis, lumbar region: Secondary | ICD-10-CM | POA: Diagnosis not present

## 2015-01-15 DIAGNOSIS — M4726 Other spondylosis with radiculopathy, lumbar region: Secondary | ICD-10-CM | POA: Diagnosis not present

## 2015-01-16 DIAGNOSIS — H0011 Chalazion right upper eyelid: Secondary | ICD-10-CM | POA: Diagnosis not present

## 2015-01-31 ENCOUNTER — Ambulatory Visit (INDEPENDENT_AMBULATORY_CARE_PROVIDER_SITE_OTHER): Payer: 59 | Admitting: Psychiatry

## 2015-01-31 ENCOUNTER — Encounter: Payer: Self-pay | Admitting: Psychiatry

## 2015-01-31 VITALS — BP 112/68 | HR 62 | Temp 97.9°F | Ht <= 58 in | Wt 161.6 lb

## 2015-01-31 DIAGNOSIS — M059 Rheumatoid arthritis with rheumatoid factor, unspecified: Secondary | ICD-10-CM | POA: Insufficient documentation

## 2015-01-31 DIAGNOSIS — F3162 Bipolar disorder, current episode mixed, moderate: Secondary | ICD-10-CM | POA: Diagnosis not present

## 2015-01-31 DIAGNOSIS — F4312 Post-traumatic stress disorder, chronic: Secondary | ICD-10-CM

## 2015-01-31 DIAGNOSIS — M1990 Unspecified osteoarthritis, unspecified site: Secondary | ICD-10-CM | POA: Insufficient documentation

## 2015-01-31 DIAGNOSIS — M5136 Other intervertebral disc degeneration, lumbar region: Secondary | ICD-10-CM | POA: Insufficient documentation

## 2015-01-31 MED ORDER — ESCITALOPRAM OXALATE 20 MG PO TABS: 20.0000 mg | ORAL_TABLET | ORAL | Status: DC

## 2015-01-31 MED ORDER — LAMOTRIGINE 100 MG PO TABS: 100.0000 mg | ORAL_TABLET | Freq: Every day | ORAL | Status: DC

## 2015-01-31 NOTE — Progress Notes (Signed)
[facility] MD/PA/NP OP Progress Note  01/31/2015 9:26 AM Meghan [name]  MRN:  [id number]  Subjective:   Meghan [name]  is a 69 year old female who presented as a walk-in. She reported that she was supposed to have her appointment this morning. She reported that she was recently d/c from the hospital after her BP and blood sugar went up and she was feeling dizzy. She was admitted for a day. She stated that is currently doing well on her medications. She appeared calm and cooperative during the interview. She is taking lamotrigine and Lexapro as prescribed. She reported that her memory is not as good now and she keep forgetting things. She is getting Ambien and Belsorma from her PCP, Dr Clayborn Bigness. She appeared anxious taking those meds and wants to stop.  She denied having any perceptual disturbances.   Chief Complaint:  Chief Complaint    Follow-up     Visit Diagnosis:     ICD-9-CM ICD-10-CM   1. Bipolar 1 disorder, mixed, moderate 296.62 F31.62   2. Chronic post-traumatic stress disorder (PTSD) 309.81 F43.12     Past Medical History:  Past Medical History  Diagnosis Date  . COPD (chronic obstructive pulmonary disease)   . Diabetes mellitus   . Hypertension   . Depression   . Anxiety   . Arthritis     RA  . GERD (gastroesophageal reflux disease)   . Osteopenia   . DDD (degenerative disc disease)   . Obese   . PTSD (post-traumatic stress disorder)     Past Surgical History  Procedure Laterality Date  . Hand reconstruction  2011    right-multiple finger joint reconst  . Abdominal hysterectomy    . Cervical fusion    . Joint replacement      bilat knee replacements  . Cholecystectomy    . Foot arthroplasty      toes x2 rt foot  . Finger arthroplasty  02/23/2012    Procedure: FINGER ARTHROPLASTY;  Surgeon: Cammie Sickle., MD;  Location: [facility];  Service: Orthopedics;  Laterality: Left;  Extensor carpi radialis longus to Extensor carpi ulnaris transfer, left  Metaphalangeal reconstructions of index and long fingers,   Family History:  Family History  Problem Relation Age of Onset  . Depression Sister   . CAD    . Hypertension    . Diabetes Mellitus II    . Arthritis     Social History:  History   Social History  . Marital Status: Divorced    Spouse Name: N/A  . Number of Children: N/A  . Years of Education: N/A   Social History Main Topics  . Smoking status: Former Research scientist (life sciences)  . Smokeless tobacco: Not on file  . Alcohol Use: No  . Drug Use: Not on file  . Sexual Activity: Not on file   Other Topics Concern  . Not on file   Social History Narrative   Additional History:  Patient currently lives with her family members and is having issues related to her granddaughter  Assessment:   Musculoskeletal: Strength & Muscle Tone: within normal limits Gait & Station: normal Patient leans: N/A  Psychiatric Specialty Exam: HPI  Review of Systems  Constitutional: Negative for chills.  HENT: Negative for hearing loss.   Eyes: Negative for photophobia.  Respiratory: Negative for sputum production.   Cardiovascular: Negative for palpitations.  Gastrointestinal: Negative for nausea.  Genitourinary: Negative for frequency.  Musculoskeletal: Negative for back pain.  Skin: Negative for rash.  Neurological: Negative for tingling.  Endo/Heme/Allergies: Negative for environmental allergies.  Psychiatric/Behavioral: Positive for depression. The patient is nervous/anxious.     There were no vitals taken for this visit.There is no weight on file to calculate BMI.  General Appearance: Casual  Eye Contact:  Fair  Speech:  Clear and Coherent  Volume:  Normal  Mood:  Euthymic  Affect:  Congruent  Thought Process:  Goal Directed  Orientation:  Full (Time, Place, and Person)  Thought Content:  WDL  Suicidal Thoughts:  No  Homicidal Thoughts:  No  Memory:  Immediate;   Fair  Judgement:  Fair  Insight:  Fair  Psychomotor Activity:   Normal  Concentration:  Fair  Recall:  [employer] of Knowledge: Fair  Language: Fair  Akathisia:  No  Handed:  Right  AIMS (if indicated):  none  Assets:  Communication Skills Desire for Improvement Physical Health  ADL's:  Intact  Cognition: WNL  Sleep:  6-7    Is the patient at risk to self?  No. Has the patient been a risk to self in the past 6 months?  No. Has the patient been a risk to self within the distant past?  No. Is the patient a risk to others?  No. Has the patient been a risk to others in the past 6 months?  No. Has the patient been a risk to others within the distant past?  No.  Current Medications: Current Outpatient Prescriptions  Medication Sig Dispense Refill  . albuterol (PROVENTIL HFA;VENTOLIN HFA) 108 (90 BASE) MCG/ACT inhaler Inhale 2 puffs into the lungs every 6 (six) hours as needed.    Marland Kitchen alendronate (FOSAMAX) 70 MG tablet Take 70 mg by mouth every 7 (seven) days. Take with a full glass of water on an empty stomach.    Marland Kitchen amoxicillin-clavulanate (AUGMENTIN) 875-125 MG per tablet Take 1 tablet by mouth every 12 (twelve) hours. 10 tablet 0  . azelastine (ASTELIN) 0.1 % nasal spray Place 1 spray into the nose.    . budesonide-formoterol (SYMBICORT) 80-4.5 MCG/ACT inhaler Inhale 2 puffs into the lungs 2 (two) times daily.    . cetirizine (ZYRTEC) 10 MG tablet Take 10 mg by mouth daily as needed for allergies.    . cloNIDine (CATAPRES) 0.1 MG tablet Take 1 tablet by mouth 2 (two) times daily.     . diphenhydrAMINE (SOMINEX) 25 MG tablet Take 25 mg by mouth as needed for allergies or sleep.    Marland Kitchen escitalopram (LEXAPRO) 20 MG tablet Take 1 tablet by mouth every morning.    Marland Kitchen esomeprazole (NEXIUM) 40 MG packet Take 1 Dose by mouth.    . GUAIFENESIN PO Take 400 mg by mouth as needed (for chest congestion).    . hydrochlorothiazide (MICROZIDE) 12.5 MG capsule Take 12.5 mg by mouth daily.    . isosorbide mononitrate (IMDUR) 30 MG 24 hr tablet Take 1 tablet by mouth  daily.     Marland Kitchen levocetirizine (XYZAL) 5 MG tablet Take 5 mg by mouth daily.     Marland Kitchen loperamide (IMODIUM) 2 MG capsule Take 2 mg by mouth as needed for diarrhea or loose stools.    Marland Kitchen loratadine (CLARITIN) 10 MG tablet Take 10 mg by mouth daily as needed for allergies.    . meloxicam (MOBIC) 7.5 MG tablet Take 7.5 mg by mouth daily.    . nortriptyline (PAMELOR) 10 MG capsule Take 3 capsules by mouth at bedtime.     Marland Kitchen oxyCODONE-acetaminophen (PERCOCET/ROXICET) 5-325 MG per  tablet Take 1 tablet by mouth.    . Phenylephrine-Acetaminophen 5-325 MG TABS Take 1 tablet by mouth as needed (for nasal congestin and sinus pain).    . predniSONE (DELTASONE) 5 MG tablet Take 5 mg by mouth daily.     . pregabalin (LYRICA) 50 MG capsule Take 50 mg by mouth 2 (two) times daily.    . saxagliptin HCl (ONGLYZA) 5 MG TABS tablet Take 1 tablet by mouth.    . Tofacitinib Citrate 5 MG TABS Take 1 tablet by mouth 2 (two) times daily.      No current facility-administered medications for this visit.    Medical Decision Making:  Established Problem, Stable/Improving (1), Review and summation of old records (2) and Review of Last Therapy Session (1)  Treatment Plan Summary:Medication management  Discussed with patient about the medications and I will advise patient to discontinue the following medications which will cause dizziness falls and memory issues, all the sleeping aids including zolpidem Belsorma  and Xanax She  will continue on Lexapro 20 mg in the morning and lamotrigine 100 mg at bedtime Advised patient to call in a week after she will stop her sleeping medication and she demonstrated understanding  she will follow-up in 2 months   More than 50% of the time spent in psychoeducation, counseling and coordination of care.    This note was generated in part or whole with voice recognition software. Voice regonition is usually quite accurate but there are transcription errors that can and very often do occur. I  apologize for any typographical errors that were not detected and corrected.   Rainey Pines 01/31/2015, 9:26 AM

## 2015-02-07 DIAGNOSIS — M5136 Other intervertebral disc degeneration, lumbar region: Secondary | ICD-10-CM | POA: Diagnosis not present

## 2015-02-07 DIAGNOSIS — M4726 Other spondylosis with radiculopathy, lumbar region: Secondary | ICD-10-CM | POA: Diagnosis not present

## 2015-02-07 DIAGNOSIS — M5416 Radiculopathy, lumbar region: Secondary | ICD-10-CM | POA: Diagnosis not present

## 2015-02-07 DIAGNOSIS — M4316 Spondylolisthesis, lumbar region: Secondary | ICD-10-CM | POA: Diagnosis not present

## 2015-02-07 DIAGNOSIS — M4806 Spinal stenosis, lumbar region: Secondary | ICD-10-CM | POA: Diagnosis not present

## 2015-02-19 DIAGNOSIS — E119 Type 2 diabetes mellitus without complications: Secondary | ICD-10-CM | POA: Diagnosis not present

## 2015-02-19 DIAGNOSIS — H60513 Acute actinic otitis externa, bilateral: Secondary | ICD-10-CM | POA: Diagnosis not present

## 2015-02-19 DIAGNOSIS — B370 Candidal stomatitis: Secondary | ICD-10-CM | POA: Diagnosis not present

## 2015-02-19 DIAGNOSIS — D519 Vitamin B12 deficiency anemia, unspecified: Secondary | ICD-10-CM | POA: Diagnosis not present

## 2015-02-19 DIAGNOSIS — I10 Essential (primary) hypertension: Secondary | ICD-10-CM | POA: Diagnosis not present

## 2015-02-27 DIAGNOSIS — M5136 Other intervertebral disc degeneration, lumbar region: Secondary | ICD-10-CM | POA: Diagnosis not present

## 2015-02-27 DIAGNOSIS — M810 Age-related osteoporosis without current pathological fracture: Secondary | ICD-10-CM | POA: Diagnosis not present

## 2015-02-27 DIAGNOSIS — M0579 Rheumatoid arthritis with rheumatoid factor of multiple sites without organ or systems involvement: Secondary | ICD-10-CM | POA: Diagnosis not present

## 2015-03-01 ENCOUNTER — Emergency Department: Payer: Medicare Other

## 2015-03-01 ENCOUNTER — Inpatient Hospital Stay
Admission: EM | Admit: 2015-03-01 | Discharge: 2015-03-10 | DRG: 202 | Disposition: A | Discharge status: Home Health | Payer: Medicare Other | Attending: Internal Medicine | Admitting: Internal Medicine

## 2015-03-01 DIAGNOSIS — R51 Headache: Secondary | ICD-10-CM | POA: Diagnosis not present

## 2015-03-01 DIAGNOSIS — E669 Obesity, unspecified: Secondary | ICD-10-CM | POA: Diagnosis present

## 2015-03-01 DIAGNOSIS — T380X5A Adverse effect of glucocorticoids and synthetic analogues, initial encounter: Secondary | ICD-10-CM | POA: Diagnosis not present

## 2015-03-01 DIAGNOSIS — Z791 Long term (current) use of non-steroidal anti-inflammatories (NSAID): Secondary | ICD-10-CM | POA: Diagnosis not present

## 2015-03-01 DIAGNOSIS — Y92230 Patient room in hospital as the place of occurrence of the external cause: Secondary | ICD-10-CM | POA: Diagnosis not present

## 2015-03-01 DIAGNOSIS — R42 Dizziness and giddiness: Secondary | ICD-10-CM | POA: Diagnosis present

## 2015-03-01 DIAGNOSIS — E871 Hypo-osmolality and hyponatremia: Secondary | ICD-10-CM | POA: Diagnosis not present

## 2015-03-01 DIAGNOSIS — M5116 Intervertebral disc disorders with radiculopathy, lumbar region: Secondary | ICD-10-CM

## 2015-03-01 DIAGNOSIS — J441 Chronic obstructive pulmonary disease with (acute) exacerbation: Secondary | ICD-10-CM | POA: Diagnosis present

## 2015-03-01 DIAGNOSIS — I959 Hypotension, unspecified: Secondary | ICD-10-CM | POA: Diagnosis not present

## 2015-03-01 DIAGNOSIS — R079 Chest pain, unspecified: Secondary | ICD-10-CM | POA: Diagnosis not present

## 2015-03-01 DIAGNOSIS — J45901 Unspecified asthma with (acute) exacerbation: Principal | ICD-10-CM

## 2015-03-01 DIAGNOSIS — K219 Gastro-esophageal reflux disease without esophagitis: Secondary | ICD-10-CM | POA: Diagnosis not present

## 2015-03-01 DIAGNOSIS — R739 Hyperglycemia, unspecified: Secondary | ICD-10-CM

## 2015-03-01 DIAGNOSIS — Z7982 Long term (current) use of aspirin: Secondary | ICD-10-CM

## 2015-03-01 DIAGNOSIS — Z7951 Long term (current) use of inhaled steroids: Secondary | ICD-10-CM | POA: Diagnosis not present

## 2015-03-01 DIAGNOSIS — R262 Difficulty in walking, not elsewhere classified: Secondary | ICD-10-CM

## 2015-03-01 DIAGNOSIS — F4310 Post-traumatic stress disorder, unspecified: Secondary | ICD-10-CM | POA: Diagnosis present

## 2015-03-01 DIAGNOSIS — R000 Tachycardia, unspecified: Secondary | ICD-10-CM | POA: Diagnosis not present

## 2015-03-01 DIAGNOSIS — E876 Hypokalemia: Secondary | ICD-10-CM | POA: Diagnosis present

## 2015-03-01 DIAGNOSIS — T461X5A Adverse effect of calcium-channel blockers, initial encounter: Secondary | ICD-10-CM | POA: Diagnosis not present

## 2015-03-01 DIAGNOSIS — Z8249 Family history of ischemic heart disease and other diseases of the circulatory system: Secondary | ICD-10-CM | POA: Diagnosis not present

## 2015-03-01 DIAGNOSIS — Z833 Family history of diabetes mellitus: Secondary | ICD-10-CM | POA: Diagnosis not present

## 2015-03-01 DIAGNOSIS — F319 Bipolar disorder, unspecified: Secondary | ICD-10-CM | POA: Diagnosis not present

## 2015-03-01 DIAGNOSIS — J0100 Acute maxillary sinusitis, unspecified: Secondary | ICD-10-CM | POA: Diagnosis present

## 2015-03-01 DIAGNOSIS — M8580 Other specified disorders of bone density and structure, unspecified site: Secondary | ICD-10-CM | POA: Diagnosis not present

## 2015-03-01 DIAGNOSIS — E1165 Type 2 diabetes mellitus with hyperglycemia: Secondary | ICD-10-CM | POA: Diagnosis not present

## 2015-03-01 DIAGNOSIS — Z6836 Body mass index (BMI) 36.0-36.9, adult: Secondary | ICD-10-CM

## 2015-03-01 DIAGNOSIS — R509 Fever, unspecified: Secondary | ICD-10-CM | POA: Diagnosis not present

## 2015-03-01 DIAGNOSIS — R0602 Shortness of breath: Secondary | ICD-10-CM | POA: Diagnosis not present

## 2015-03-01 DIAGNOSIS — Z7983 Long term (current) use of bisphosphonates: Secondary | ICD-10-CM

## 2015-03-01 DIAGNOSIS — G44209 Tension-type headache, unspecified, not intractable: Secondary | ICD-10-CM | POA: Diagnosis present

## 2015-03-01 DIAGNOSIS — Z794 Long term (current) use of insulin: Secondary | ICD-10-CM | POA: Diagnosis not present

## 2015-03-01 DIAGNOSIS — R040 Epistaxis: Secondary | ICD-10-CM | POA: Diagnosis present

## 2015-03-01 DIAGNOSIS — B001 Herpesviral vesicular dermatitis: Secondary | ICD-10-CM | POA: Diagnosis not present

## 2015-03-01 DIAGNOSIS — Z8639 Personal history of other endocrine, nutritional and metabolic disease: Secondary | ICD-10-CM

## 2015-03-01 DIAGNOSIS — M5136 Other intervertebral disc degeneration, lumbar region: Secondary | ICD-10-CM

## 2015-03-01 DIAGNOSIS — R5383 Other fatigue: Secondary | ICD-10-CM

## 2015-03-01 DIAGNOSIS — M069 Rheumatoid arthritis, unspecified: Secondary | ICD-10-CM | POA: Diagnosis present

## 2015-03-01 DIAGNOSIS — M25562 Pain in left knee: Secondary | ICD-10-CM

## 2015-03-01 DIAGNOSIS — I10 Essential (primary) hypertension: Secondary | ICD-10-CM | POA: Diagnosis present

## 2015-03-01 DIAGNOSIS — J449 Chronic obstructive pulmonary disease, unspecified: Secondary | ICD-10-CM | POA: Diagnosis not present

## 2015-03-01 DIAGNOSIS — E119 Type 2 diabetes mellitus without complications: Secondary | ICD-10-CM | POA: Diagnosis not present

## 2015-03-01 LAB — CBC WITH DIFFERENTIAL/PLATELET
BASOS PCT: 0 %
Basophils Absolute: 0 10*3/uL (ref 0–0.1)
EOS ABS: 0 10*3/uL (ref 0–0.7)
EOS PCT: 1 %
HEMATOCRIT: 36.9 % (ref 35.0–47.0)
HEMOGLOBIN: 12.9 g/dL (ref 12.0–16.0)
Lymphocytes Relative: 13 %
Lymphs Abs: 0.7 10*3/uL — ABNORMAL LOW (ref 1.0–3.6)
MCH: 28.7 pg (ref 26.0–34.0)
MCHC: 34.9 g/dL (ref 32.0–36.0)
MCV: 82.2 fL (ref 80.0–100.0)
Monocytes Absolute: 0.4 10*3/uL (ref 0.2–0.9)
Monocytes Relative: 8 %
NEUTROS PCT: 78 %
Neutro Abs: 4.3 10*3/uL (ref 1.4–6.5)
Platelets: 170 10*3/uL (ref 150–440)
RBC: 4.49 MIL/uL (ref 3.80–5.20)
RDW: 17.4 % — ABNORMAL HIGH (ref 11.5–14.5)
WBC: 5.5 10*3/uL (ref 3.6–11.0)

## 2015-03-01 LAB — BASIC METABOLIC PANEL
Anion gap: 14 (ref 5–15)
BUN: 7 mg/dL (ref 6–20)
CALCIUM: 8.6 mg/dL — AB (ref 8.9–10.3)
CO2: 27 mmol/L (ref 22–32)
Chloride: 92 mmol/L — ABNORMAL LOW (ref 101–111)
Creatinine, Ser: 0.72 mg/dL (ref 0.44–1.00)
GFR calc Af Amer: >60 mL/min (ref >60)
Glucose, Bld: 379 mg/dL — ABNORMAL HIGH (ref 65–99)
Potassium: 2.3 mmol/L — CL (ref 3.5–5.1)
Sodium: 133 mmol/L — ABNORMAL LOW (ref 135–145)

## 2015-03-01 LAB — URINALYSIS COMPLETE WITH MICROSCOPIC (ARMC ONLY)
BILIRUBIN URINE: NEGATIVE
Bacteria, UA: NONE SEEN
Hgb urine dipstick: NEGATIVE
Ketones, ur: NEGATIVE mg/dL
Leukocytes, UA: NEGATIVE
Nitrite: NEGATIVE
PH: 7 (ref 5.0–8.0)
PROTEIN: NEGATIVE mg/dL
RBC / HPF: NONE SEEN RBC/hpf (ref 0–5)
Specific Gravity, Urine: 1.022 (ref 1.005–1.030)

## 2015-03-01 LAB — MAGNESIUM: MAGNESIUM: 2.1 mg/dL (ref 1.7–2.4)

## 2015-03-01 MED ORDER — PANTOPRAZOLE SODIUM 40 MG PO TBEC
40.0000 mg | DELAYED_RELEASE_TABLET | Freq: Every day | ORAL | Status: DC
  Administered 2015-03-01 – 2015-03-10 (×10): 40 mg via ORAL
  Filled 2015-03-01 (×11): qty 1

## 2015-03-01 MED ORDER — POTASSIUM CHLORIDE CRYS ER 20 MEQ PO TBCR
40.0000 meq | EXTENDED_RELEASE_TABLET | Freq: Once | ORAL | Status: AC
  Administered 2015-03-01: 40 meq via ORAL
  Filled 2015-03-01: qty 2

## 2015-03-01 MED ORDER — SODIUM CHLORIDE 0.9 % IJ SOLN
3.0000 mL | Freq: Two times a day (BID) | INTRAMUSCULAR | Status: DC
  Administered 2015-03-01 – 2015-03-03 (×4): 3 mL via INTRAVENOUS

## 2015-03-01 MED ORDER — TOFACITINIB CITRATE 5 MG PO TABS
1.0000 | ORAL_TABLET | Freq: Two times a day (BID) | ORAL | Status: DC
  Administered 2015-03-01: 1 via ORAL
  Administered 2015-03-02: 09:00:00 via ORAL
  Administered 2015-03-02 – 2015-03-03 (×2): 1 via ORAL
  Filled 2015-03-01 (×4): qty 1

## 2015-03-01 MED ORDER — ONDANSETRON HCL 4 MG/2ML IJ SOLN: 4.0000 mg | Freq: Four times a day (QID) | INTRAMUSCULAR | Status: DC | PRN

## 2015-03-01 MED ORDER — ENOXAPARIN SODIUM 40 MG/0.4ML ~~LOC~~ SOLN
40.0000 mg | SUBCUTANEOUS | Status: DC
  Administered 2015-03-01 – 2015-03-09 (×9): 40 mg via SUBCUTANEOUS
  Filled 2015-03-01 (×8): qty 0.4

## 2015-03-01 MED ORDER — LAMOTRIGINE 100 MG PO TABS
100.0000 mg | ORAL_TABLET | Freq: Every day | ORAL | Status: DC
  Administered 2015-03-02 – 2015-03-10 (×9): 100 mg via ORAL
  Filled 2015-03-01 (×9): qty 1

## 2015-03-01 MED ORDER — METHYLPREDNISOLONE SODIUM SUCC 125 MG IJ SOLR
125.0000 mg | Freq: Once | INTRAMUSCULAR | Status: AC
  Administered 2015-03-01: 125 mg via INTRAVENOUS
  Filled 2015-03-01: qty 2

## 2015-03-01 MED ORDER — ACETAMINOPHEN 650 MG RE SUPP
650.0000 mg | Freq: Four times a day (QID) | RECTAL | Status: DC | PRN
Start: 2015-03-01 — End: 2015-03-10

## 2015-03-01 MED ORDER — METHYLPREDNISOLONE SODIUM SUCC 125 MG IJ SOLR
60.0000 mg | Freq: Four times a day (QID) | INTRAMUSCULAR | Status: DC
  Administered 2015-03-01 – 2015-03-02 (×3): 60 mg via INTRAVENOUS
  Administered 2015-03-02: 125 mg via INTRAVENOUS
  Administered 2015-03-02 – 2015-03-03 (×2): 60 mg via INTRAVENOUS
  Filled 2015-03-01 (×6): qty 2

## 2015-03-01 MED ORDER — NORTRIPTYLINE HCL 10 MG PO CAPS
30.0000 mg | ORAL_CAPSULE | Freq: Every day | ORAL | Status: DC
  Administered 2015-03-01 – 2015-03-09 (×9): 30 mg via ORAL
  Filled 2015-03-01 (×6): qty 3
  Filled 2015-03-01: qty 1
  Filled 2015-03-01 (×5): qty 3

## 2015-03-01 MED ORDER — PREGABALIN 50 MG PO CAPS
50.0000 mg | ORAL_CAPSULE | Freq: Two times a day (BID) | ORAL | Status: DC
  Administered 2015-03-01 – 2015-03-10 (×18): 50 mg via ORAL
  Filled 2015-03-01 (×18): qty 1

## 2015-03-01 MED ORDER — ISOSORBIDE MONONITRATE ER 30 MG PO TB24
30.0000 mg | ORAL_TABLET | Freq: Every day | ORAL | Status: DC
  Administered 2015-03-02 – 2015-03-10 (×9): 30 mg via ORAL
  Filled 2015-03-01 (×9): qty 1

## 2015-03-01 MED ORDER — MAGNESIUM SULFATE 2 GM/50ML IV SOLN
2.0000 g | Freq: Once | INTRAVENOUS | Status: AC
  Administered 2015-03-01: 2 g via INTRAVENOUS
  Filled 2015-03-01: qty 50

## 2015-03-01 MED ORDER — DEXTROSE 5 % IV SOLN
500.0000 mg | Freq: Once | INTRAVENOUS | Status: AC
  Administered 2015-03-01: 23:00:00 500 mg via INTRAVENOUS
  Filled 2015-03-01: qty 500

## 2015-03-01 MED ORDER — ZOLPIDEM TARTRATE 5 MG PO TABS
5.0000 mg | ORAL_TABLET | Freq: Every evening | ORAL | Status: DC | PRN
  Administered 2015-03-02 – 2015-03-09 (×7): 5 mg via ORAL
  Filled 2015-03-01 (×8): qty 1

## 2015-03-01 MED ORDER — SODIUM CHLORIDE 0.9 % IJ SOLN: 3.0000 mL | INTRAMUSCULAR | Status: DC | PRN

## 2015-03-01 MED ORDER — LORATADINE 10 MG PO TABS
10.0000 mg | ORAL_TABLET | Freq: Every day | ORAL | Status: DC
  Administered 2015-03-02 – 2015-03-10 (×9): 10 mg via ORAL
  Filled 2015-03-01 (×9): qty 1

## 2015-03-01 MED ORDER — LEVALBUTEROL HCL 1.25 MG/0.5ML IN NEBU
1.2500 mg | INHALATION_SOLUTION | Freq: Four times a day (QID) | RESPIRATORY_TRACT | Status: DC
  Administered 2015-03-01 – 2015-03-02 (×2): 1.25 mg via RESPIRATORY_TRACT
  Filled 2015-03-01 (×2): qty 0.5

## 2015-03-01 MED ORDER — DIPHENHYDRAMINE HCL 25 MG PO TABS
25.0000 mg | ORAL_TABLET | Freq: Four times a day (QID) | ORAL | Status: DC | PRN
  Filled 2015-03-01: qty 1

## 2015-03-01 MED ORDER — SODIUM CHLORIDE 0.9 % IV SOLN: 250.0000 mL | INTRAVENOUS | Status: DC | PRN

## 2015-03-01 MED ORDER — ACETAMINOPHEN 325 MG PO TABS
650.0000 mg | ORAL_TABLET | Freq: Four times a day (QID) | ORAL | Status: DC | PRN
  Administered 2015-03-02 – 2015-03-10 (×5): 650 mg via ORAL
  Filled 2015-03-01 (×6): qty 2

## 2015-03-01 MED ORDER — HYDROCHLOROTHIAZIDE 12.5 MG PO CAPS: 12.5000 mg | ORAL_CAPSULE | Freq: Every day | ORAL | Status: DC | PRN

## 2015-03-01 MED ORDER — POTASSIUM CHLORIDE CRYS ER 20 MEQ PO TBCR
40.0000 meq | EXTENDED_RELEASE_TABLET | Freq: Two times a day (BID) | ORAL | Status: AC
  Administered 2015-03-01 – 2015-03-02 (×3): 40 meq via ORAL
  Filled 2015-03-01 (×3): qty 2

## 2015-03-01 MED ORDER — SODIUM CHLORIDE 0.9 % IJ SOLN
3.0000 mL | Freq: Two times a day (BID) | INTRAMUSCULAR | Status: DC
  Administered 2015-03-01: 3 mL via INTRAVENOUS

## 2015-03-01 MED ORDER — ALBUTEROL SULFATE (2.5 MG/3ML) 0.083% IN NEBU: 7.5000 mg | INHALATION_SOLUTION | Freq: Once | RESPIRATORY_TRACT | Status: DC

## 2015-03-01 MED ORDER — ESCITALOPRAM OXALATE 10 MG PO TABS
20.0000 mg | ORAL_TABLET | ORAL | Status: DC
  Administered 2015-03-02 – 2015-03-10 (×9): 20 mg via ORAL
  Filled 2015-03-01 (×6): qty 2
  Filled 2015-03-01: qty 1
  Filled 2015-03-01 (×4): qty 2

## 2015-03-01 MED ORDER — AZELASTINE HCL 0.1 % NA SOLN: 1.0000 | Freq: Two times a day (BID) | NASAL | Status: DC | PRN

## 2015-03-01 MED ORDER — IPRATROPIUM-ALBUTEROL 0.5-2.5 (3) MG/3ML IN SOLN
9.0000 mL | Freq: Once | RESPIRATORY_TRACT | Status: AC
  Administered 2015-03-01: 9 mL via RESPIRATORY_TRACT
  Filled 2015-03-01: qty 9

## 2015-03-01 MED ORDER — BUDESONIDE-FORMOTEROL FUMARATE 80-4.5 MCG/ACT IN AERO
2.0000 | INHALATION_SPRAY | Freq: Two times a day (BID) | RESPIRATORY_TRACT | Status: DC
  Administered 2015-03-01 – 2015-03-03 (×4): 2 via RESPIRATORY_TRACT
  Filled 2015-03-01: qty 6.9

## 2015-03-01 MED ORDER — SODIUM CHLORIDE 0.9 % IV BOLUS (SEPSIS)
1000.0000 mL | Freq: Once | INTRAVENOUS | Status: AC
  Administered 2015-03-01: 1000 mL via INTRAVENOUS

## 2015-03-01 MED ORDER — ONDANSETRON HCL 4 MG PO TABS
4.0000 mg | ORAL_TABLET | Freq: Four times a day (QID) | ORAL | Status: DC | PRN
  Administered 2015-03-09: 4 mg via ORAL
  Filled 2015-03-01: qty 1

## 2015-03-01 MED ORDER — SODIUM CHLORIDE 0.9 % IV BOLUS (SEPSIS)
1000.0000 mL | Freq: Once | INTRAVENOUS | Status: AC
  Administered 2015-03-01: 1000 mL via INTRAVENOUS
  Filled 2015-03-01: qty 1000

## 2015-03-01 MED ORDER — ACETAMINOPHEN 500 MG PO TABS
1000.0000 mg | ORAL_TABLET | Freq: Once | ORAL | Status: AC
  Administered 2015-03-01: 1000 mg via ORAL
  Filled 2015-03-01: qty 2

## 2015-03-01 MED ORDER — INSULIN ASPART 100 UNIT/ML ~~LOC~~ SOLN
0.0000 [IU] | Freq: Three times a day (TID) | SUBCUTANEOUS | Status: DC
  Administered 2015-03-02: 9 [IU] via SUBCUTANEOUS
  Administered 2015-03-03: 08:00:00 7 [IU] via SUBCUTANEOUS
  Filled 2015-03-01: qty 7
  Filled 2015-03-01: qty 9

## 2015-03-01 MED ORDER — ALBUTEROL SULFATE (2.5 MG/3ML) 0.083% IN NEBU
2.5000 mg | INHALATION_SOLUTION | RESPIRATORY_TRACT | Status: DC | PRN
  Administered 2015-03-02: 14:00:00 2.5 mg via RESPIRATORY_TRACT
  Filled 2015-03-01: qty 3

## 2015-03-01 MED ORDER — INSULIN GLARGINE 100 UNIT/ML ~~LOC~~ SOLN
10.0000 [IU] | Freq: Every day | SUBCUTANEOUS | Status: DC
  Administered 2015-03-01 – 2015-03-03 (×3): 10 [IU] via SUBCUTANEOUS
  Filled 2015-03-01 (×5): qty 0.1

## 2015-03-01 MED ORDER — INSULIN ASPART 100 UNIT/ML ~~LOC~~ SOLN
0.0000 [IU] | Freq: Every day | SUBCUTANEOUS | Status: DC
  Administered 2015-03-01: 23:00:00 4 [IU] via SUBCUTANEOUS
  Administered 2015-03-02: 22:00:00 3 [IU] via SUBCUTANEOUS
  Filled 2015-03-01: qty 3
  Filled 2015-03-01: qty 4

## 2015-03-01 MED ORDER — CLONIDINE HCL 0.1 MG PO TABS
0.1000 mg | ORAL_TABLET | Freq: Two times a day (BID) | ORAL | Status: DC
  Administered 2015-03-01 – 2015-03-10 (×18): 0.1 mg via ORAL
  Filled 2015-03-01 (×18): qty 1

## 2015-03-01 NOTE — ED Provider Notes (Addendum)
[facility] Emergency Department Provider Note  ____________________________________________  Time seen: Approximately 3:30 PM  I have reviewed the triage vital signs and the nursing notes.   HISTORY  Chief Complaint Hyperglycemia    HPI Meghan [name] is a 69 y.o. female with a history of diabetes, hypertension and rheumatoid arthritis who presents today from EMS for hypoglycemia. The patient was calling EMS earlier for a nosebleed which has since resolved. However, she was found to have a glucose of 355 and was brought into the emergency department. The patient denies any pain, nausea or vomiting. Does admit to urinating more than normal. However denies any burning with urination. Says has not had any change in her medications and has been compliant with her diabetes medications.   Past Medical History  Diagnosis Date  . COPD (chronic obstructive pulmonary disease)   . Diabetes mellitus   . Hypertension   . Depression   . Anxiety   . Arthritis     RA  . GERD (gastroesophageal reflux disease)   . Osteopenia   . DDD (degenerative disc disease)   . Obese   . PTSD (post-traumatic stress disorder)     Patient Active Problem List   Diagnosis Date Noted  . DDD (degenerative disc disease), lumbar 01/31/2015  . Arthritis, degenerative 01/31/2015  . Rheumatoid arthritis with rheumatoid factor 01/31/2015  . Diabetes mellitus, type II 12/24/2014  . HTN (hypertension) 12/24/2014  . Sepsis 12/24/2014  . Left knee pain 12/24/2014  . GERD (gastroesophageal reflux disease) 12/24/2014  . COPD (chronic obstructive pulmonary disease) 12/24/2014  . Depression 12/24/2014  . Anxiety 12/24/2014  . Severe bipolar disorder with psychotic features, mood-congruent 11/16/2014  . Neurosis, posttraumatic 11/16/2014  . H/O diabetes mellitus 11/16/2014  . H/O: HTN (hypertension) 11/16/2014  . H/O gastric ulcer 11/16/2014  . H/O: obesity 11/16/2014  . H/O arthritis  11/16/2014  . Barton's fracture of distal radius, closed 09/19/2014  . Neuritis or radiculitis due to rupture of lumbar intervertebral disc 05/11/2014  . Cervico-occipital neuralgia 03/26/2014  . Difficulty in walking 03/26/2014  . Cervical spine syndrome 01/26/2014  . Cephalalgia 01/09/2014  . Disordered sleep 01/09/2014  . Diabetes 10/19/2013  . BP (high blood pressure) 10/19/2013  . Adiposity 10/19/2013  . Cardiac murmur 10/19/2013  . PNA (pneumonia) 10/19/2013  . Breath shortness 10/19/2013    Past Surgical History  Procedure Laterality Date  . Hand reconstruction  2011    right-multiple finger joint reconst  . Abdominal hysterectomy    . Cervical fusion    . Joint replacement      bilat knee replacements  . Cholecystectomy    . Foot arthroplasty      toes x2 rt foot  . Finger arthroplasty  02/23/2012    Procedure: FINGER ARTHROPLASTY;  Surgeon: Cammie Sickle., MD;  Location: [place];  Service: Orthopedics;  Laterality: Left;  Extensor carpi radialis longus to Extensor carpi ulnaris transfer, left Metaphalangeal reconstructions of index and long fingers,    Current Outpatient Rx  Name  Route  Sig  Dispense  Refill  . albuterol (PROVENTIL HFA;VENTOLIN HFA) 108 (90 BASE) MCG/ACT inhaler   Inhalation   Inhale 2 puffs into the lungs every 6 (six) hours as needed.         Marland Kitchen alendronate (FOSAMAX) 70 MG tablet   Oral   Take 70 mg by mouth every 7 (seven) days. Take with a full glass of water on an empty stomach.         Marland Kitchen  amoxicillin-clavulanate (AUGMENTIN) 875-125 MG per tablet   Oral   Take 1 tablet by mouth every 12 (twelve) hours. Patient not taking: Reported on 01/31/2015   10 tablet   0   . azelastine (ASTELIN) 0.1 % nasal spray   Nasal   Place 1 spray into the nose.         Marland Kitchen azithromycin (ZITHROMAX) 250 MG tablet               . budesonide-formoterol (SYMBICORT) 80-4.5 MCG/ACT inhaler   Inhalation   Inhale 2 puffs into the  lungs 2 (two) times daily.         . cetirizine (ZYRTEC) 10 MG tablet   Oral   Take 10 mg by mouth daily as needed for allergies.         . Cholecalciferol (VITAMIN D3) 2000 UNITS capsule   Oral   Take by mouth.         . cloNIDine (CATAPRES) 0.1 MG tablet   Oral   Take 1 tablet by mouth 2 (two) times daily.          . diphenhydrAMINE (SOMINEX) 25 MG tablet   Oral   Take 25 mg by mouth as needed for allergies or sleep.         Marland Kitchen escitalopram (LEXAPRO) 20 MG tablet   Oral   Take 1 tablet (20 mg total) by mouth every morning.   30 tablet   1   . esomeprazole (NEXIUM) 40 MG packet   Oral   Take 1 Dose by mouth.         . furosemide (LASIX) 20 MG tablet   Oral   Take by mouth.         Marland Kitchen glimepiride (AMARYL) 4 MG tablet   Oral   Take by mouth.         . GUAIFENESIN PO   Oral   Take 400 mg by mouth as needed (for chest congestion).         . hydrochlorothiazide (MICROZIDE) 12.5 MG capsule   Oral   Take 12.5 mg by mouth daily.         . isosorbide mononitrate (IMDUR) 30 MG 24 hr tablet   Oral   Take 1 tablet by mouth daily.          Marland Kitchen ketoconazole (NIZORAL) 2 % cream      KETOCONAZOLE, 2% (External Cream) - Historical Medication  (2 %) Active         . lamoTRIgine (LAMICTAL) 100 MG tablet               . lamoTRIgine (LAMICTAL) 100 MG tablet   Oral   Take 1 tablet (100 mg total) by mouth daily.   30 tablet   1   . levocetirizine (XYZAL) 5 MG tablet   Oral   Take 5 mg by mouth daily.          Marland Kitchen lidocaine (LIDODERM) 5 %      Lidoderm, 5% (External Patch) - Historical Medication  (5 %(700 mg/p) Active         . loperamide (IMODIUM) 2 MG capsule   Oral   Take 2 mg by mouth as needed for diarrhea or loose stools.         Marland Kitchen loratadine (CLARITIN) 10 MG tablet   Oral   Take 10 mg by mouth daily as needed for allergies.         . meloxicam (MOBIC) 7.5 MG  tablet   Oral   Take 7.5 mg by mouth daily.         .  metoprolol succinate (TOPROL-XL) 100 MG 24 hr tablet   Oral   Take by mouth.         . nortriptyline (PAMELOR) 10 MG capsule   Oral   Take 3 capsules by mouth at bedtime.          . ondansetron (ZOFRAN-ODT) 8 MG disintegrating tablet               . oxyCODONE-acetaminophen (PERCOCET/ROXICET) 5-325 MG per tablet   Oral   Take 1 tablet by mouth.         . Phenylephrine-Acetaminophen 5-325 MG TABS   Oral   Take 1 tablet by mouth as needed (for nasal congestin and sinus pain).         . predniSONE (DELTASONE) 5 MG tablet   Oral   Take 5 mg by mouth daily.          . pregabalin (LYRICA) 50 MG capsule   Oral   Take 50 mg by mouth 2 (two) times daily.         . saxagliptin HCl (ONGLYZA) 5 MG TABS tablet   Oral   Take 1 tablet by mouth.         . Tofacitinib Citrate 5 MG TABS   Oral   Take 1 tablet by mouth 2 (two) times daily.          Marland Kitchen zolpidem (AMBIEN) 10 MG tablet   Oral   Take by mouth.           Allergies Gabapentin; Iodine; Naproxen; and Nsaids  Family History  Problem Relation Age of Onset  . Depression Sister   . CAD    . Hypertension    . Diabetes Mellitus II    . Arthritis    . Hypertension Mother   . Arthritis/Rheumatoid Mother   . Heart attack Father   . Hypertension Father     Social History History  Substance Use Topics  . Smoking status: Never Smoker   . Smokeless tobacco: Never Used  . Alcohol Use: No    Review of Systems Constitutional: No fever/chills Eyes: No visual changes. ENT: No sore throat. Cardiovascular: Denies chest pain. Respiratory: Denies shortness of breath. Gastrointestinal: No abdominal pain.  No nausea, no vomiting.  No diarrhea.  No constipation. Genitourinary: Negative for dysuria. Musculoskeletal: Negative for back pain. Skin: Negative for rash. Neurological: Negative for headaches, focal weakness or numbness.  10-point ROS otherwise  negative.  ____________________________________________   PHYSICAL EXAM:  VITAL SIGNS: ED Triage Vitals  Enc Vitals Group     BP 03/01/15 1500 149/90 mmHg     Pulse Rate 03/01/15 1500 126     Resp --      Temp 03/01/15 1500 98.4 F (36.9 C)     Temp Source 03/01/15 1500 Oral     SpO2 03/01/15 1500 100 %     Weight 03/01/15 1500 156 lb (70.761 kg)     Height 03/01/15 1500 4\' 9"  (1.448 m)     Head Cir --      Peak Flow --      Pain Score 03/01/15 1505 0     Pain Loc --      Pain Edu? --      Excl. in [place]? --     Constitutional: Alert and oriented. Well appearing and in no acute distress. Eyes: Conjunctivae  are normal. PERRL. EOMI. Head: Atraumatic. Nose: No congestion/rhinnorhea. Dried blood to the right anterior neck. There is no active bleeding at this time. Mouth/Throat: Mucous membranes are moist.  Oropharynx non-erythematous. Neck: No stridor.   Cardiovascular: Tachycardic, regular rhythm. Grossly normal heart sounds.  Good peripheral circulation. Respiratory: Normal respiratory effort.  No retractions. Lungs CTAB. Gastrointestinal: Soft and nontender. No distention. No abdominal bruits. No CVA tenderness. Musculoskeletal: No lower extremity tenderness nor edema.  No joint effusions. Neurologic:  Normal speech and language. No gross focal neurologic deficits are appreciated. No gait instability. Skin:  Skin is warm, dry and intact. No rash noted. Psychiatric: Mood and affect are normal. Speech and behavior are normal.  ____________________________________________   LABS (all labs ordered are listed, but only abnormal results are displayed)  Labs Reviewed  CBC WITH DIFFERENTIAL/PLATELET - Abnormal; Notable for the following:    RDW 17.4 (*)    Lymphs Abs 0.7 (*)    All other components within normal limits  URINALYSIS COMPLETEWITH MICROSCOPIC ([facility] ONLY)  BASIC METABOLIC PANEL    ____________________________________________  EKG   ____________________________________________  RADIOLOGY  X-ray without any acute findings. I personally reviewed these images. ____________________________________________   PROCEDURES    ____________________________________________   INITIAL IMPRESSION / ASSESSMENT AND PLAN / ED COURSE  Pertinent labs & imaging results that were available during my care of the patient were reviewed by me and considered in my medical decision making (see chart for details).  ----------------------------------------- 7:49 PM on 03/01/2015 -----------------------------------------  Patient reassessed and now with wheezing. Now saying is been having cough for the past 2 days. Patient is now tachypneic with labored respirations and with diffuse wheezes. Chest x-ray without any acute infiltrate. Reassessed after 3 DuoNeb abs and patient only with minimal improvement. Ordered 3 more albuterol treatments. Also ordered magnesium. We'll admit to the hospital. Signed out to Dr. Bridgett Larsson. Patient without any sick contacts. Does not smoke. Says does have a history of COPD. ____________________________________________   FINAL CLINICAL IMPRESSION(S) / ED DIAGNOSES  Acute hyperglycemia. Acute COPD exacerbation.    Orbie Pyo, MD 03/01/15 1950  ED ECG REPORT I, Doran Stabler, the attending physician, personally viewed and interpreted this ECG.   Date: 03/01/2015  EKG Time: 2002  Rate: 134  Rhythm: sinus tachycardia  Axis: Oral axis  Intervals:none  ST&T Change: No ST elevation or depression. No abnormal T-wave inversions.   Orbie Pyo, MD 03/01/15 [phone]

## 2015-03-01 NOTE — H&P (Signed)
[place] [facility] [place] NAME: Meghan [name]    MR#:  [id number]  DATE OF BIRTH:  12-03-1945  DATE OF ADMISSION:  03/01/2015  PRIMARY CARE PHYSICIAN: No primary care provider on file.   REQUESTING/REFERRING PHYSICIAN: Orbie Pyo, MD  CHIEF COMPLAINT:   Chief Complaint  Patient presents with  . Hyperglycemia    HISTORY OF PRESENT ILLNESS:  Meghan [name]  is a 69 y.o. female with a known history of hypertension, diabetes and COPD. the patient came to the ED due to nosebleeding today.Bleeding stopped but the patient was noticed to have a high blood sugar is 355. She said that she has been having coughing for the past 2 days. She had a one-day episode of shortness of breath and wheezing, which resolved with nebulizer. In ED, the patient is started to have  shortness of breath and wheezing. She was treated with DuoNeb 3 times, magnesium and IV Solu-Medrol with minimal improvement. She also has tachycardia at 140s. Chest x-ray didn't show any infiltrate or pulmonary edema.  PAST MEDICAL HISTORY:   Past Medical History  Diagnosis Date  . COPD (chronic obstructive pulmonary disease)   . Diabetes mellitus   . Hypertension   . Depression   . Anxiety   . Arthritis     RA  . GERD (gastroesophageal reflux disease)   . Osteopenia   . DDD (degenerative disc disease)   . Obese   . PTSD (post-traumatic stress disorder)     PAST SURGICAL HISTORY:   Past Surgical History  Procedure Laterality Date  . Hand reconstruction  2011    right-multiple finger joint reconst  . Abdominal hysterectomy    . Cervical fusion    . Joint replacement      bilat knee replacements  . Cholecystectomy    . Foot arthroplasty      toes x2 rt foot  . Finger arthroplasty  02/23/2012    Procedure: FINGER ARTHROPLASTY;  Surgeon: Cammie Sickle., MD;  Location: [place];  Service: Orthopedics;  Laterality: Left;  Extensor carpi radialis  longus to Extensor carpi ulnaris transfer, left Metaphalangeal reconstructions of index and long fingers,    SOCIAL HISTORY:   History  Substance Use Topics  . Smoking status: Never Smoker   . Smokeless tobacco: Never Used  . Alcohol Use: No    FAMILY HISTORY:   Family History  Problem Relation Age of Onset  . Depression Sister   . CAD    . Hypertension    . Diabetes Mellitus II    . Arthritis    . Hypertension Mother   . Arthritis/Rheumatoid Mother   . Heart attack Father   . Hypertension Father     DRUG ALLERGIES:   Allergies  Allergen Reactions  . Gabapentin Other (See Comments)    Pt states that it causes her BP to drop.   . Iodine Other (See Comments)    Reaction:  Syncopy  . Naproxen Hives  . Nsaids Other (See Comments)    Reaction:  Unknown     REVIEW OF SYSTEMS:  CONSTITUTIONAL: No fever, fatigue or weakness.  EYES: No blurred or double vision.  EARS, NOSE, AND THROAT: No tinnitus or ear pain. Epistaxis. RESPIRATORY: Has cough, shortness of breath, wheezing but no hemoptysis.  CARDIOVASCULAR: No chest pain, orthopnea, edema.  GASTROINTESTINAL: No nausea, vomiting, diarrhea or abdominal pain.  GENITOURINARY: No dysuria, hematuria. Has urine frequency. ENDOCRINE: No polyuria, nocturia,  HEMATOLOGY: No anemia, easy bruising or bleeding SKIN: No rash or lesion. MUSCULOSKELETAL: No joint pain or arthritis.   NEUROLOGIC: No tingling, numbness, weakness.  PSYCHIATRY: No anxiety or depression.   MEDICATIONS AT HOME:   Prior to Admission medications   Medication Sig Start Date End Date Taking? Authorizing Provider  albuterol (PROVENTIL HFA;VENTOLIN HFA) 108 (90 BASE) MCG/ACT inhaler Inhale 2 puffs into the lungs every 6 (six) hours as needed for wheezing or shortness of breath.    Yes Historical Provider, MD  alendronate (FOSAMAX) 70 MG tablet Take 70 mg by mouth once a week. Pt takes on Monday.   Yes Historical Provider, MD  azelastine (ASTELIN) 0.1 %  nasal spray Place 1 spray into both nostrils 2 (two) times daily as needed for rhinitis.    Yes Historical Provider, MD  budesonide-formoterol (SYMBICORT) 80-4.5 MCG/ACT inhaler Inhale 2 puffs into the lungs 2 (two) times daily.   Yes Historical Provider, MD  Cholecalciferol (VITAMIN D) 2000 UNITS CAPS Take 1 capsule by mouth daily.   Yes Historical Provider, MD  cloNIDine (CATAPRES) 0.1 MG tablet Take 0.1 mg by mouth 2 (two) times daily.   Yes Historical Provider, MD  diphenhydrAMINE (BENADRYL) 25 MG tablet Take 25 mg by mouth every 6 (six) hours as needed for allergies.   Yes Historical Provider, MD  escitalopram (LEXAPRO) 20 MG tablet Take 1 tablet (20 mg total) by mouth every morning. 01/31/15  Yes Rainey Pines, MD  esomeprazole (NEXIUM) 40 MG capsule Take 40 mg by mouth at bedtime.   Yes Historical Provider, MD  hydrochlorothiazide (MICROZIDE) 12.5 MG capsule Take 12.5-25 mg by mouth daily as needed (for edema).    Yes Historical Provider, MD  isosorbide mononitrate (IMDUR) 30 MG 24 hr tablet Take 30 mg by mouth daily.   Yes Historical Provider, MD  ketoconazole (NIZORAL) 2 % cream Apply 1 application topically at bedtime.   Yes Historical Provider, MD  lamoTRIgine (LAMICTAL) 100 MG tablet Take 1 tablet (100 mg total) by mouth daily. 01/31/15  Yes Rainey Pines, MD  levocetirizine (XYZAL) 5 MG tablet Take 5 mg by mouth daily.    Yes Historical Provider, MD  meloxicam (MOBIC) 7.5 MG tablet Take 7.5 mg by mouth daily as needed for pain.    Yes Historical Provider, MD  nortriptyline (PAMELOR) 10 MG capsule Take 30 mg by mouth at bedtime.   Yes Historical Provider, MD  ondansetron (ZOFRAN-ODT) 8 MG disintegrating tablet Take 8 mg by mouth 3 (three) times daily as needed for nausea or vomiting.   Yes Historical Provider, MD  predniSONE (DELTASONE) 5 MG tablet Take 5 mg by mouth at bedtime.    Yes Historical Provider, MD  pregabalin (LYRICA) 50 MG capsule Take 50 mg by mouth 2 (two) times daily.   Yes  Historical Provider, MD  saxagliptin HCl (ONGLYZA) 5 MG TABS tablet Take 5 mg by mouth at bedtime.   Yes Historical Provider, MD  Tofacitinib Citrate 5 MG TABS Take 1 tablet by mouth 2 (two) times daily.    Yes Historical Provider, MD  zolpidem (AMBIEN) 10 MG tablet Take 10 mg by mouth at bedtime as needed for sleep.   Yes Historical Provider, MD  amoxicillin-clavulanate (AUGMENTIN) 875-125 MG per tablet Take 1 tablet by mouth every 12 (twelve) hours. Patient not taking: Reported on 01/31/2015 12/25/14   Fritzi Mandes, MD      VITAL SIGNS:  Blood pressure 164/82, pulse 119, temperature 98.4 F (36.9 C), temperature source Oral, resp. rate  19, height 4\' 9"  (1.448 m), weight 70.761 kg (156 lb), SpO2 100 %.  PHYSICAL EXAMINATION:  GENERAL:  69 y.o.-year-old patient lying in the bed with no acute distress. Obese. EYES: Pupils equal, round, reactive to light and accommodation. No scleral icterus. Extraocular muscles intact.  HEENT: Head atraumatic, normocephalic. Oropharynx and nasopharynx clear.  NECK:  Supple, no jugular venous distention. No thyroid enlargement, no tenderness.  LUNGS: Normal breath sounds bilaterally, expiratory wheezing, no rales,rhonchi or crepitation. No use of accessory muscles of respiration.  CARDIOVASCULAR: S1, S2 regular rhythm, tachycardia. No murmurs, rubs, or gallops.  ABDOMEN: Soft, nontender, nondistended. Bowel sounds present. No organomegaly or mass.  EXTREMITIES: No pedal edema, cyanosis, or clubbing.  NEUROLOGIC: Cranial nerves II through XII are intact. Muscle strength 5/5 in all extremities. Sensation intact. Gait not checked.  PSYCHIATRIC: The patient is alert and oriented x 3.  SKIN: No obvious rash, lesion, or ulcer.   LABORATORY PANEL:   CBC  Recent Labs Lab 03/01/15 1530  WBC 5.5  HGB 12.9  HCT 36.9  PLT 170   ------------------------------------------------------------------------------------------------------------------  Chemistries    Recent Labs Lab 03/01/15 1530  NA 133*  K 2.3*  CL 92*  CO2 27  GLUCOSE 379*  BUN 7  CREATININE 0.72  CALCIUM 8.6*   ------------------------------------------------------------------------------------------------------------------  Cardiac Enzymes No results for input(s): TROPONINI in the last 168 hours. ------------------------------------------------------------------------------------------------------------------  RADIOLOGY:  Dg Chest 2 View  03/01/2015   CLINICAL DATA:  Chest pain and difficulty breathing ; wheezing  EXAM: CHEST  2 VIEW  COMPARISON:  Dec 24, 2014  FINDINGS: There is stable elevation of the right hemidiaphragm. There is no edema or consolidation. The heart size and pulmonary vascularity are within normal limits. No adenopathy. There is degenerative change in the thoracic spine.  IMPRESSION: No edema or consolidation. Stable elevation of the right hemidiaphragm.   Electronically Signed   By: Lowella Grip III M.D.   On: 03/01/2015 18:36    EKG:   Orders placed or performed during the hospital encounter of 12/24/14  . ED EKG  . ED EKG  . EKG 12-Lead  . EKG 12-Lead  . EKG    IMPRESSION AND PLAN:   COPD exacerbation Hypokalemia Hyponatremia Hypertension Diabetes  The patient will be admitted to medical floor. I will will start Xopenex every 6 hours and continue Solu Medrol IV every 6 hours. I will continue to give potassium supplement, follow-up potassium level and magnesium level. For diabetes, I will start sliding scale and give Lantus 10 units at bedtime subcutaneous. Check hemoglobin A1c. Continue patient home hypertension medication.   All the records are reviewed and case discussed with ED provider. Management plans discussed with the patient, family and they are in agreement.  CODE STATUS: Full code  TOTAL TIME TAKING CARE OF THIS PATIENT: 53 minutes.    Demetrios Loll M.D on 03/01/2015 at 8:07 PM  Between 7am to 6pm - Pager -  [phone]  After 6pm go to [email or web address] - password [email or web address]  [facility]  Office  [phone]  CC: Primary care physician; No primary care provider on file.

## 2015-03-01 NOTE — ED Notes (Signed)
Patient resting quietly. Fluids continue to infuse without complication. Patient voices no concerns or complaints at this time.

## 2015-03-01 NOTE — ED Notes (Signed)
Patient had nose bleed earlier today. EMS was called and used afrin and nosebleed stopped. BS was checked and was high so patient was brought in.

## 2015-03-02 LAB — GLUCOSE, CAPILLARY
GLUCOSE-CAPILLARY: 320 mg/dL — AB (ref 65–99)
GLUCOSE-CAPILLARY: 327 mg/dL — AB (ref 65–99)
Glucose-Capillary: 452 mg/dL — ABNORMAL HIGH (ref 65–99)
Glucose-Capillary: 421 mg/dL — ABNORMAL HIGH (ref 65–99)
Glucose-Capillary: 355 mg/dL — ABNORMAL HIGH (ref 65–99)
Glucose-Capillary: 295 mg/dL — ABNORMAL HIGH (ref 65–99)

## 2015-03-02 LAB — HEMOGLOBIN A1C: Hgb A1c MFr Bld: 10.3 % — ABNORMAL HIGH (ref 4.0–6.0)

## 2015-03-02 LAB — CBC
HCT: 33.7 % — ABNORMAL LOW (ref 35.0–47.0)
HEMOGLOBIN: 11.6 g/dL — AB (ref 12.0–16.0)
MCH: 28.4 pg (ref 26.0–34.0)
MCHC: 34.6 g/dL (ref 32.0–36.0)
MCV: 82.2 fL (ref 80.0–100.0)
PLATELETS: 139 10*3/uL — AB (ref 150–440)
RBC: 4.1 MIL/uL (ref 3.80–5.20)
RDW: 17.6 % — ABNORMAL HIGH (ref 11.5–14.5)
WBC: 3.7 10*3/uL (ref 3.6–11.0)

## 2015-03-02 LAB — BASIC METABOLIC PANEL
Anion gap: 11 (ref 5–15)
BUN: 10 mg/dL (ref 6–20)
CO2: 26 mmol/L (ref 22–32)
CREATININE: 0.77 mg/dL (ref 0.44–1.00)
Calcium: 7.8 mg/dL — ABNORMAL LOW (ref 8.9–10.3)
Chloride: 96 mmol/L — ABNORMAL LOW (ref 101–111)
GLUCOSE: 451 mg/dL — AB (ref 65–99)
Potassium: 3.4 mmol/L — ABNORMAL LOW (ref 3.5–5.1)
Sodium: 133 mmol/L — ABNORMAL LOW (ref 135–145)

## 2015-03-02 MED ORDER — INSULIN ASPART 100 UNIT/ML ~~LOC~~ SOLN
12.0000 [IU] | Freq: Once | SUBCUTANEOUS | Status: AC
  Administered 2015-03-02: 13:00:00 12 [IU] via SUBCUTANEOUS
  Filled 2015-03-02: qty 12

## 2015-03-02 MED ORDER — HYDROCOD POLST-CPM POLST ER 10-8 MG/5ML PO SUER
5.0000 mL | Freq: Two times a day (BID) | ORAL | Status: DC
  Administered 2015-03-02 – 2015-03-10 (×17): 5 mL via ORAL
  Filled 2015-03-02 (×17): qty 5

## 2015-03-02 MED ORDER — OXYCODONE-ACETAMINOPHEN 5-325 MG PO TABS
1.0000 | ORAL_TABLET | Freq: Four times a day (QID) | ORAL | Status: DC | PRN
  Administered 2015-03-03 – 2015-03-08 (×11): 1 via ORAL
  Filled 2015-03-02 (×11): qty 1

## 2015-03-02 MED ORDER — LEVALBUTEROL HCL 1.25 MG/0.5ML IN NEBU
1.2500 mg | INHALATION_SOLUTION | Freq: Two times a day (BID) | RESPIRATORY_TRACT | Status: DC
  Administered 2015-03-02 – 2015-03-03 (×3): 1.25 mg via RESPIRATORY_TRACT
  Filled 2015-03-02 (×3): qty 0.5

## 2015-03-02 MED ORDER — INSULIN ASPART 100 UNIT/ML ~~LOC~~ SOLN
10.0000 [IU] | Freq: Once | SUBCUTANEOUS | Status: AC
  Administered 2015-03-02: 08:00:00 100 [IU] via SUBCUTANEOUS
  Filled 2015-03-02: qty 10

## 2015-03-02 NOTE — Progress Notes (Signed)
[place] at [place] NAME: Meghan [name]    MR#:  [id number]  DATE OF BIRTH:  [birth year]  SUBJECTIVE: . Admitted for asthma exacerbation. Patient is still wheezing. Having issues with blood sugar secondary to steroids.   CHIEF COMPLAINT:   Chief Complaint  Patient presents with  . Hyperglycemia    REVIEW OF SYSTEMS:   ROS CONSTITUTIONAL: No fever, fatigue or weakness.  EYES: No blurred or double vision.  EARS, NOSE, AND THROAT: No tinnitus or ear pain.  RESPIRATORY: cough,wheezing CARDIOVASCULAR: No chest pain, orthopnea, edema.  GASTROINTESTINAL: No nausea, vomiting, diarrhea or abdominal pain.  GENITOURINARY: No dysuria, hematuria.  ENDOCRINE: No polyuria, nocturia,  HEMATOLOGY: No anemia, easy bruising or bleeding SKIN: No rash or lesion. MUSCULOSKELETAL: No joint pain or arthritis.   NEUROLOGIC: No tingling, numbness, weakness.  PSYCHIATRY: No anxiety or depression.   DRUG ALLERGIES:   Allergies  Allergen Reactions  . Gabapentin Other (See Comments)    Pt states that it causes her BP to drop.   . Iodine Other (See Comments)    Reaction:  Syncopy  . Naproxen Hives  . Nsaids Other (See Comments)    Reaction:  Unknown     VITALS:  Blood pressure 141/66, pulse 104, temperature 97.8 F (36.6 C), temperature source Oral, resp. rate 22, height 4\' 9"  (1.448 m), weight 76.34 kg (168 lb 4.8 oz), SpO2 100 %.  PHYSICAL EXAMINATION:  GENERAL:  69 y.o.-year-old patient lying in the bed with no acute distress.  EYES: Pupils equal, round, reactive to light and accommodation. No scleral icterus. Extraocular muscles intact.  HEENT: Head atraumatic, normocephalic. Oropharynx and nasopharynx clear.  NECK:  Supple, no jugular venous distention. No thyroid enlargement, no tenderness.  LUNGS: Bilateral expiratory wheeze in all lung fields. . No use of accessory muscles of respiration.  CARDIOVASCULAR: S1, S2 normal. No murmurs, rubs,  or gallops.  ABDOMEN: Soft, nontender, nondistended. Bowel sounds present. No organomegaly or mass.  EXTREMITIES: No pedal edema, cyanosis, or clubbing.  NEUROLOGIC: Cranial nerves II through XII are intact. Muscle strength 5/5 in all extremities. Sensation intact. Gait not checked.  PSYCHIATRIC: The patient is alert and oriented x 3.  SKIN: No obvious rash, lesion, or ulcer.    LABORATORY PANEL:   CBC  Recent Labs Lab 03/02/15 0427  WBC 3.7  HGB 11.6*  HCT 33.7*  PLT 139*   ------------------------------------------------------------------------------------------------------------------  Chemistries   Recent Labs Lab 03/01/15 2205 03/02/15 0427  NA  --  133*  K  --  3.4*  CL  --  96*  CO2  --  26  GLUCOSE  --  451*  BUN  --  10  CREATININE  --  0.77  CALCIUM  --  7.8*  MG 2.1  --    ------------------------------------------------------------------------------------------------------------------  Cardiac Enzymes No results for input(s): TROPONINI in the last 168 hours. ------------------------------------------------------------------------------------------------------------------  RADIOLOGY:  Dg Chest 2 View  03/01/2015   CLINICAL DATA:  Chest pain and difficulty breathing ; wheezing  EXAM: CHEST  2 VIEW  COMPARISON:  Dec 24, 2014  FINDINGS: There is stable elevation of the right hemidiaphragm. There is no edema or consolidation. The heart size and pulmonary vascularity are within normal limits. No adenopathy. There is degenerative change in the thoracic spine.  IMPRESSION: No edema or consolidation. Stable elevation of the right hemidiaphragm.   Electronically Signed   By: Lowella Grip III M.D.   On: 03/01/2015 18:36  EKG:   Orders placed or performed during the hospital encounter of 03/01/15  . EKG    ASSESSMENT AND PLAN:  1. Acute asthma flare patient says she never smoker-she has a history of asthma so she has asthma flare rather than COPD flare.  Continue nebulizers, steroids, oxygen. #2 hyperglycemia secondary to steroids continue insulin sliding scale and home medications. #3 hypokalemia hyponatremia: Improved  #4 epistaxis resolved #5 hypertension controlled #6 bipolar disorder continue Lamictal   All the records are reviewed and case discussed with Care Management/Social Workerr. Management plans discussed with the patient, family and they are in agreement.  CODE STATUS:full  TOTAL TIME TAKING CARE OF THIS PATIENT: 41minutes.   POSSIBLE D/C IN 1-2 DAYS, DEPENDING ON CLINICAL CONDITION.   Epifanio Lesches M.D on 03/02/2015 at 10:51 AM  Between 7am to 6pm - Pager - [phone]  After 6pm go to [email or web address] - password [email or web address] [facility]  [facility]  Office  [phone]  CC: Primary care physician; No primary care provider on file.

## 2015-03-02 NOTE — Plan of Care (Signed)
Problem: Discharge Progression Outcomes Goal: Other Discharge Outcomes/Goals 1.  Hyperglycemia/COPD - CBGs ordered AC this shift, results >400, received one time insulin orders to cover results; pt on IV Solu-Medrol; weaned pt to room air with O2 sats >92%, pt complained of shortness of breath while lying in bed, put oxygen at 1L [place] humidified back on pt with relief; pt with productive cough, complaining of midsternal nonradiating pain when she coughs, pt started on Tussionex 6ml PO q12hrs this shift. 2. Pain - pt complained of bilateral ear ache, PRN Tylenol given with little relief; to continue to monitor pt

## 2015-03-02 NOTE — Plan of Care (Addendum)
Problem: Discharge Progression Outcomes Goal: Discharge plan in place and appropriate Outcome: Progressing 1. Patient likes to be called Meghan [name] 2. Patient lives at home, and grand-son lives with her. 3. Patient admitted with hyperglycemia/copd, nervous anxious lady, states "I'm bipolar" home medications ordered. Heart rate has been increased since Er visit, telemetry ordered. Patient with solumedrol ordered.  4. Patient has arthritis, and home medication has been relabeled and put in her slot in medication room Morrie Sheldon) 5.Patient is on sliding scale and lantus ordered hs for hyperglycemia.

## 2015-03-02 NOTE — Plan of Care (Signed)
Problem: Discharge Progression Outcomes Goal: Other Discharge Outcomes/Goals Outcome: Progressing Plan of care , progress to goal: patient admitted with hyperglycemia/copd, MD ordered chest x-ray , x-ray negative for infiltrates or pulmonay edema, patient tachycardiac, telemetry ordered, patient with sliding scale ordered, and lantus 10units at HS , blood sugars ac&hs.Patient presented with wheezing, duonebs ordered, solumedrol iv, patient has never smoked, she is afebrile, medicated for headache. Potassium decreased and supplements ordered. HGBa1c ordered awaiting results.

## 2015-03-02 NOTE — Progress Notes (Addendum)
Patient has been watching tv, talking on phone the first part of night, has slept a few hours, complaint of headache and she was medicated with good results, she ambulates to bathroom with standby assist, remains on 2 liters of 02 via [place]. Encouraged to call if any needs or any changes. Patient up multiple times to go to the bathroom, cannot tolerate SCDs.

## 2015-03-02 NOTE — Plan of Care (Signed)
Problem: Discharge Progression Outcomes Goal: Discharge plan in place and appropriate Outcome: Progressing 1. Patient likes to be called Meghan [name] 2. Patient lives at home; grandson lives with her 3. admitted with hyperglycemia/copd - monitor CBG results, administer insulin based upon results; perform respiratory assessments, monitor pt's complaints; pt not on home oxygen, wean appropriately; on IV Solu-Medrol   4. Med hx includes arthritis, her home medication has been relabeled and put in her slot in medication room Morrie Sheldon); nervous anxious lady, states "I'm bipolar" home medications ordered.

## 2015-03-03 ENCOUNTER — Inpatient Hospital Stay: Payer: Medicare Other

## 2015-03-03 LAB — GLUCOSE, CAPILLARY
GLUCOSE-CAPILLARY: 361 mg/dL — AB (ref 65–99)
GLUCOSE-CAPILLARY: 146 mg/dL — AB (ref 65–99)
Glucose-Capillary: 331 mg/dL — ABNORMAL HIGH (ref 65–99)
Glucose-Capillary: 234 mg/dL — ABNORMAL HIGH (ref 65–99)

## 2015-03-03 MED ORDER — MORPHINE SULFATE 2 MG/ML IJ SOLN
2.0000 mg | Freq: Once | INTRAMUSCULAR | Status: AC
  Administered 2015-03-03: 2 mg via INTRAVENOUS
  Filled 2015-03-03: qty 1

## 2015-03-03 MED ORDER — SODIUM CHLORIDE 0.9 % IV BOLUS (SEPSIS): 1000.0000 mL | Freq: Once | INTRAVENOUS | Status: DC

## 2015-03-03 MED ORDER — VANCOMYCIN HCL IN DEXTROSE 1-5 GM/200ML-% IV SOLN
1000.0000 mg | Freq: Once | INTRAVENOUS | Status: AC
  Administered 2015-03-03: 18:00:00 1000 mg via INTRAVENOUS
  Filled 2015-03-03: qty 200

## 2015-03-03 MED ORDER — METHYLPREDNISOLONE SODIUM SUCC 125 MG IJ SOLR
60.0000 mg | Freq: Two times a day (BID) | INTRAMUSCULAR | Status: DC
  Administered 2015-03-03 – 2015-03-07 (×8): 60 mg via INTRAVENOUS
  Filled 2015-03-03 (×8): qty 2

## 2015-03-03 MED ORDER — SODIUM CHLORIDE 0.9 % IV SOLN
INTRAVENOUS | Status: DC
  Administered 2015-03-03: 17:00:00 via INTRAVENOUS

## 2015-03-03 MED ORDER — DEXTROSE 5 % IV SOLN
500.0000 mg | INTRAVENOUS | Status: DC
  Administered 2015-03-03: 10:00:00 500 mg via INTRAVENOUS
  Filled 2015-03-03 (×2): qty 500

## 2015-03-03 MED ORDER — INSULIN ASPART 100 UNIT/ML ~~LOC~~ SOLN
0.0000 [IU] | Freq: Three times a day (TID) | SUBCUTANEOUS | Status: DC
Start: 2015-03-03 — End: 2015-03-10
  Administered 2015-03-03: 17:00:00 1 [IU] via SUBCUTANEOUS
  Administered 2015-03-03: 9 [IU] via SUBCUTANEOUS
  Administered 2015-03-04: 7 [IU] via SUBCUTANEOUS
  Administered 2015-03-04: 2 [IU] via SUBCUTANEOUS
  Administered 2015-03-04: 1 [IU] via SUBCUTANEOUS
  Administered 2015-03-05: 5 [IU] via SUBCUTANEOUS
  Administered 2015-03-05: 7 [IU] via SUBCUTANEOUS
  Administered 2015-03-06: 5 [IU] via SUBCUTANEOUS
  Administered 2015-03-07: 9 [IU] via SUBCUTANEOUS
  Administered 2015-03-07: 3 [IU] via SUBCUTANEOUS
  Administered 2015-03-07: 2 [IU] via SUBCUTANEOUS
  Administered 2015-03-08: 9 [IU] via SUBCUTANEOUS
  Administered 2015-03-08: 2 [IU] via SUBCUTANEOUS
  Administered 2015-03-09 (×2): 1 [IU] via SUBCUTANEOUS
  Administered 2015-03-10: 3 [IU] via SUBCUTANEOUS
  Administered 2015-03-10: 9 [IU] via SUBCUTANEOUS
  Administered 2015-03-10: 1 [IU] via SUBCUTANEOUS
  Filled 2015-03-03: qty 1
  Filled 2015-03-03: qty 3
  Filled 2015-03-03: qty 5
  Filled 2015-03-03: qty 3
  Filled 2015-03-03: qty 9
  Filled 2015-03-03: qty 5
  Filled 2015-03-03: qty 1
  Filled 2015-03-03: qty 9
  Filled 2015-03-03: qty 7
  Filled 2015-03-03: qty 2
  Filled 2015-03-03: qty 1
  Filled 2015-03-03: qty 9
  Filled 2015-03-03: qty 1
  Filled 2015-03-03: qty 2
  Filled 2015-03-03: qty 9
  Filled 2015-03-03: qty 7
  Filled 2015-03-03: qty 2
  Filled 2015-03-03 (×2): qty 1

## 2015-03-03 MED ORDER — DM-GUAIFENESIN ER 30-600 MG PO TB12: 1.0000 | ORAL_TABLET | Freq: Two times a day (BID) | ORAL | Status: DC

## 2015-03-03 MED ORDER — IPRATROPIUM-ALBUTEROL 0.5-2.5 (3) MG/3ML IN SOLN: 3.0000 mL | RESPIRATORY_TRACT | Status: DC

## 2015-03-03 MED ORDER — PIPERACILLIN-TAZOBACTAM 3.375 G IVPB
3.3750 g | Freq: Three times a day (TID) | INTRAVENOUS | Status: DC
  Administered 2015-03-03 – 2015-03-09 (×17): 3.375 g via INTRAVENOUS
  Filled 2015-03-03 (×23): qty 50

## 2015-03-03 MED ORDER — CEFTRIAXONE SODIUM IN DEXTROSE 20 MG/ML IV SOLN: 1.0000 g | INTRAVENOUS | Status: DC

## 2015-03-03 MED ORDER — DEXTROSE 5 % IV SOLN
1.0000 g | INTRAVENOUS | Status: DC
  Administered 2015-03-03 – 2015-03-04 (×2): 1 g via INTRAVENOUS
  Filled 2015-03-03 (×3): qty 10

## 2015-03-03 MED ORDER — VANCOMYCIN HCL IN DEXTROSE 1-5 GM/200ML-% IV SOLN
1000.0000 mg | INTRAVENOUS | Status: AC
  Administered 2015-03-04 – 2015-03-06 (×4): 1000 mg via INTRAVENOUS
  Filled 2015-03-03 (×5): qty 200

## 2015-03-03 NOTE — Progress Notes (Signed)
[place] at [place] NAME: Meghan [name]    MR#:  [id number]  DATE OF BIRTH:  September 11, 1945  SUBJECTIVE: . Seen today, patient complains of lots of cough and sinus pain. Still short of breath..  Also complains of sinus pain.   CHIEF COMPLAINT:   Chief Complaint  Patient presents with  . Hyperglycemia    REVIEW OF SYSTEMS:   Review of Systems  Constitutional: Negative for fever and chills.  HENT: Positive for sore throat. Negative for hearing loss.   Eyes: Negative for blurred vision, double vision and photophobia.  Respiratory: Positive for cough, shortness of breath and wheezing. Negative for hemoptysis.   Cardiovascular: Negative for palpitations, orthopnea and leg swelling.  Gastrointestinal: Negative for vomiting, abdominal pain and diarrhea.  Genitourinary: Negative for dysuria and urgency.  Musculoskeletal: Negative for myalgias and neck pain.  Skin: Negative for rash.  Neurological: Negative for dizziness, focal weakness, seizures, weakness and headaches.  Psychiatric/Behavioral: Negative for memory loss. The patient does not have insomnia.    CONSTITUTIONAL: No fever, fatigue or weakness.  EYES: No blurred or double vision.  EARS, NOSE, AND THROAT: No tinnitus or ear pain.  RESPIRATORY: cough,wheezing CARDIOVASCULAR: No chest pain, orthopnea, edema.  GASTROINTESTINAL: No nausea, vomiting, diarrhea or abdominal pain.  GENITOURINARY: No dysuria, hematuria.  ENDOCRINE: No polyuria, nocturia,  HEMATOLOGY: No anemia, easy bruising or bleeding SKIN: No rash or lesion. MUSCULOSKELETAL: No joint pain or arthritis.   NEUROLOGIC: No tingling, numbness, weakness.  PSYCHIATRY: No anxiety or depression.   DRUG ALLERGIES:   Allergies  Allergen Reactions  . Gabapentin Other (See Comments)    Pt states that it causes her BP to drop.   . Iodine Other (See Comments)    Reaction:  Syncopy  . Naproxen Hives  . Nsaids Other (See  Comments)    Reaction:  Unknown     VITALS:  Blood pressure 149/91, pulse 116, temperature 100.7 F (38.2 C), temperature source Oral, resp. rate 18, height 4\' 9"  (1.448 m), weight 76.34 kg (168 lb 4.8 oz), SpO2 95 %.  PHYSICAL EXAMINATION:  GENERAL:  69 y.o.-year-old patient lying in the bed with no acute distress.  EYES: Pupils equal, round, reactive to light and accommodation. No scleral icterus. Extraocular muscles intact.  HEENT: Head atraumatic, normocephalic. Oropharynx and nasopharynx clear.  NECK:  Supple, no jugular venous distention. No thyroid enlargement, no tenderness.  LUNGS: Bilateral expiratory wheeze in all lung fields. . No use of accessory muscles of respiration.  CARDIOVASCULAR: S1, S2 normal. No murmurs, rubs, or gallops.  ABDOMEN: Soft, nontender, nondistended. Bowel sounds present. No organomegaly or mass.  EXTREMITIES: No pedal edema, cyanosis, or clubbing.  NEUROLOGIC: Cranial nerves II through XII are intact. Muscle strength 5/5 in all extremities. Sensation intact. Gait not checked.  PSYCHIATRIC: The patient is alert and oriented x 3.  SKIN: No obvious rash, lesion, or ulcer.    LABORATORY PANEL:   CBC  Recent Labs Lab 03/02/15 0427  WBC 3.7  HGB 11.6*  HCT 33.7*  PLT 139*   ------------------------------------------------------------------------------------------------------------------  Chemistries   Recent Labs Lab 03/01/15 2205 03/02/15 0427  NA  --  133*  K  --  3.4*  CL  --  96*  CO2  --  26  GLUCOSE  --  451*  BUN  --  10  CREATININE  --  0.77  CALCIUM  --  7.8*  MG 2.1  --    ------------------------------------------------------------------------------------------------------------------  Cardiac  Enzymes No results for input(s): TROPONINI in the last 168 hours. ------------------------------------------------------------------------------------------------------------------  RADIOLOGY:  Dg Chest 2 View  03/01/2015    CLINICAL DATA:  Chest pain and difficulty breathing ; wheezing  EXAM: CHEST  2 VIEW  COMPARISON:  Dec 24, 2014  FINDINGS: There is stable elevation of the right hemidiaphragm. There is no edema or consolidation. The heart size and pulmonary vascularity are within normal limits. No adenopathy. There is degenerative change in the thoracic spine.  IMPRESSION: No edema or consolidation. Stable elevation of the right hemidiaphragm.   Electronically Signed   By: Lowella Grip III M.D.   On: 03/01/2015 18:36    EKG:   Orders placed or performed during the hospital encounter of 03/01/15  . EKG  . EKG 12-Lead  . EKG 12-Lead    ASSESSMENT AND PLAN:  1. Acute asthma flare patient says she never smoked-she has a history of asthma so she has asthma flare rather than COPD flare. Continue nebulizers, steroids, oxygen. Add empiric and the biotics Rocephin, Zithromax secondary to continued wheezing and low-grade fever. chest x-ray repeated today.  #2 hyperglycemia secondary to steroids continue insulin sliding scale and home medications. Hemoglobin A1c is 10. Her diabetes is not well controlled. Add NovoLog 10 units 3 times a day, Lantus 5 units at night. Obtain diabetic nurse consult. #3 hypokalemia hyponatremia: Improved  #4 epistaxis resolved #5 hypertension controlled #6 bipolar disorder continue Lamictal Headaches secondary to cough ;continue Percocet.  All the records are reviewed and case discussed with Care Management/Social Workerr. Management plans discussed with the patient, family and they are in agreement.  CODE STATUS:full  TOTAL TIME TAKING CARE OF THIS PATIENT: 29minutes.   POSSIBLE D/C IN 1-2 DAYS, DEPENDING ON CLINICAL CONDITION.   Epifanio Lesches M.D on 03/03/2015 at 9:43 AM  Between 7am to 6pm - Pager - [phone]  After 6pm go to [email or web address] - password [email or web address] [facility]  [place]  Office  [phone]  CC: Primary care physician; No primary care  provider on file.

## 2015-03-03 NOTE — Plan of Care (Addendum)
Problem: Discharge Progression Outcomes Goal: Other Discharge Outcomes/Goals Outcome: Not Progressing Patient remains tachycardic, on telemetry monitor, Alert and oriented, having low grade fever today.  Repeat chest x ray this am, very sleepy today, continues to complain of head ache and rib and back pain from coughing.  Low grade temp back, Patient reports just "generally not feeling good."  Called MD reported continued sinus type head ache and back and rib pain, blood cultures ordered and one time dose of morphine given,  Patient has remained very tachycardic today MD aware.  Blood sugars remain elevated as well.

## 2015-03-03 NOTE — Progress Notes (Signed)
ANTIBIOTIC CONSULT NOTE - INITIAL  Pharmacy Consult for Vancomycin and Zosyn Indication: rule out sepsis  Allergies  Allergen Reactions  . Gabapentin Other (See Comments)    Pt states that it causes her BP to drop.   . Iodine Other (See Comments)    Reaction:  Syncopy  . Naproxen Hives  . Nsaids Other (See Comments)    Reaction:  Unknown     Patient Measurements: Height: 4\' 9"  (144.8 cm) Weight: 168 lb 4.8 oz (76.34 kg) IBW/kg (Calculated) : 38.6 Adjusted Body Weight: 57.8 kg  Vital Signs: Temp: 100.5 F (38.1 C) (07/24 1847) Temp Source: Oral (07/24 1847) BP: 124/61 mmHg (07/24 1845) Pulse Rate: 126 (07/24 1845) Intake/Output from previous day: 07/23 0701 - 07/24 0700 In: 840 [P.O.:840] Out: -  Intake/Output from this shift:    Labs:  Recent Labs  03/01/15 1530 03/02/15 0427  WBC 5.5 3.7  HGB 12.9 11.6*  PLT 170 139*  CREATININE 0.72 0.77   Estimated Creatinine Clearance: 57.1 mL/min (by C-G formula based on Cr of 0.77). No results for input(s): VANCOTROUGH, VANCOPEAK, VANCORANDOM, GENTTROUGH, GENTPEAK, GENTRANDOM, TOBRATROUGH, TOBRAPEAK, TOBRARND, AMIKACINPEAK, AMIKACINTROU, AMIKACIN in the last 72 hours.   Microbiology: No results found for this or any previous visit (from the past 720 hour(s)).  Medical History: Past Medical History  Diagnosis Date  . COPD (chronic obstructive pulmonary disease)   . Diabetes mellitus   . Hypertension   . Depression   . Anxiety   . Arthritis     RA  . GERD (gastroesophageal reflux disease)   . Osteopenia   . DDD (degenerative disc disease)   . Obese   . PTSD (post-traumatic stress disorder)     Medications:  Anti-infectives    Start     Dose/Rate Route Frequency Ordered Stop   03/03/15 1800  vancomycin (VANCOCIN) IVPB 1000 mg/200 mL premix     1,000 mg 200 mL/hr over 60 Minutes Intravenous  Once 03/03/15 1724     03/03/15 1730  piperacillin-tazobactam (ZOSYN) IVPB 3.375 g     3.375 g 12.5 mL/hr over 240  Minutes Intravenous 3 times per day 03/03/15 1724     03/03/15 0945  azithromycin (ZITHROMAX) 500 mg in dextrose 5 % 250 mL IVPB  Status:  Discontinued     500 mg 250 mL/hr over 60 Minutes Intravenous Every 24 hours 03/03/15 0943 03/03/15 1654   03/03/15 0815  cefTRIAXone (ROCEPHIN) 1 g in dextrose 5 % 50 mL IVPB     1 g 100 mL/hr over 30 Minutes Intravenous Every 24 hours 03/03/15 0746     03/03/15 0745  cefTRIAXone (ROCEPHIN) 1 g in dextrose 5 % 50 mL IVPB - Premix  Status:  Discontinued     1 g 100 mL/hr over 30 Minutes Intravenous Every 24 hours 03/03/15 0737 03/03/15 0743   03/01/15 1945  azithromycin (ZITHROMAX) 500 mg in dextrose 5 % 250 mL IVPB     500 mg 250 mL/hr over 60 Minutes Intravenous  Once 03/01/15 1941 03/02/15 0004     Assessment: Patient admitted for asthma exacerbation. Initially started on Ceftriaxone and Azithromycin. Patient now spiking fevers and MD has ordered pharmacy consult for Vancomycin and Zosyn. Noted that patient is currently taking Xeljanz (toficitanib) which can cause serious infections.   Goal of Therapy:  Vancomycin trough level 15-20 mcg/ml Resolution of symptoms  Plan:  Measure antibiotic drug levels at steady state Follow up culture results Will order Zosyn 3.375g IV q8h EI and Vancomycin  1g IV q18h (starting 12 hours after initial dose for stacked dosing). Will check a trough level prior to 5th dose.  Paged MD Edwina Barth) about stopping Morrie Sheldon, he agreeded. Will need to follow up on whether this should be continued at discharge.   Paulina Fusi, PharmD, BCPS 03/03/2015 7:30 PM

## 2015-03-03 NOTE — Progress Notes (Signed)
MD notified of pt continued complaint of headache. Percocet is not helping. Pt states it feels like a sinus headache. MD states to continue percocet at this time.

## 2015-03-03 NOTE — Plan of Care (Signed)
Problem: Discharge Progression Outcomes Goal: Other Discharge Outcomes/Goals Outcome: Progressing Pain:  Pt c/o headache throughout the night, not relieved by tylenol. MD notified and order for percocet obtained. Percocet administered as ordered with little relief noted.  Hemo:  Pt's CBG improved some this shift. Still requiring insulin coverage at bedtime. IV solumedrol continues. Pt continues to get SOB on exertion. Remains on 1 L O2. Some improvement noted after nebulizer treatment.

## 2015-03-04 LAB — BASIC METABOLIC PANEL
Anion gap: 8 (ref 5–15)
BUN: 15 mg/dL (ref 6–20)
CO2: 24 mmol/L (ref 22–32)
Calcium: 7.5 mg/dL — ABNORMAL LOW (ref 8.9–10.3)
Chloride: 101 mmol/L (ref 101–111)
Creatinine, Ser: 0.58 mg/dL (ref 0.44–1.00)
GFR calc Af Amer: >60 mL/min (ref >60)
GFR calc non Af Amer: >60 mL/min (ref >60)
Glucose, Bld: 278 mg/dL — ABNORMAL HIGH (ref 65–99)
POTASSIUM: 4.1 mmol/L (ref 3.5–5.1)
Sodium: 133 mmol/L — ABNORMAL LOW (ref 135–145)

## 2015-03-04 LAB — GLUCOSE, CAPILLARY
GLUCOSE-CAPILLARY: 322 mg/dL — AB (ref 65–99)
Glucose-Capillary: 192 mg/dL — ABNORMAL HIGH (ref 65–99)
Glucose-Capillary: 143 mg/dL — ABNORMAL HIGH (ref 65–99)
Glucose-Capillary: 187 mg/dL — ABNORMAL HIGH (ref 65–99)

## 2015-03-04 LAB — LACTIC ACID, PLASMA: Lactic Acid, Venous: 1.3 mmol/L (ref 0.5–2.0)

## 2015-03-04 LAB — CBC
HCT: 29.6 % — ABNORMAL LOW (ref 35.0–47.0)
Hemoglobin: 10 g/dL — ABNORMAL LOW (ref 12.0–16.0)
MCH: 28.3 pg (ref 26.0–34.0)
MCHC: 33.7 g/dL (ref 32.0–36.0)
MCV: 84 fL (ref 80.0–100.0)
Platelets: 111 10*3/uL — ABNORMAL LOW (ref 150–440)
RBC: 3.53 MIL/uL — AB (ref 3.80–5.20)
RDW: 18.5 % — ABNORMAL HIGH (ref 11.5–14.5)
WBC: 3.2 10*3/uL — AB (ref 3.6–11.0)

## 2015-03-04 MED ORDER — INSULIN GLARGINE 100 UNIT/ML ~~LOC~~ SOLN
15.0000 [IU] | Freq: Every day | SUBCUTANEOUS | Status: DC
  Administered 2015-03-04: 15 [IU] via SUBCUTANEOUS
  Filled 2015-03-04 (×2): qty 0.15

## 2015-03-04 MED ORDER — INSULIN ASPART 100 UNIT/ML ~~LOC~~ SOLN
5.0000 [IU] | Freq: Three times a day (TID) | SUBCUTANEOUS | Status: DC
  Administered 2015-03-04 – 2015-03-05 (×3): 5 [IU] via SUBCUTANEOUS
  Filled 2015-03-04 (×3): qty 5

## 2015-03-04 MED ORDER — LEVALBUTEROL HCL 1.25 MG/0.5ML IN NEBU
1.2500 mg | INHALATION_SOLUTION | Freq: Four times a day (QID) | RESPIRATORY_TRACT | Status: DC | PRN
  Administered 2015-03-04 – 2015-03-05 (×2): 1.25 mg via RESPIRATORY_TRACT
  Filled 2015-03-04 (×2): qty 0.5

## 2015-03-04 MED ORDER — LIVING WELL WITH DIABETES BOOK
Freq: Once | Status: AC
Start: 2015-03-04 — End: 2015-03-04
  Administered 2015-03-04: 09:00:00
  Filled 2015-03-04: qty 1

## 2015-03-04 NOTE — Progress Notes (Addendum)
Inpatient Diabetes Program Recommendations  AACE/ADA: New Consensus Statement on Inpatient Glycemic Control (2013)  Target Ranges:  Prepandial:   less than 140 mg/dL      Peak postprandial:   less than 180 mg/dL (1-2 hours)      Critically ill patients:  140 - 180 mg/dL   Results for Meghan [name], Meghan [name] (MRN [id number]) as of 03/04/2015 07:52  Ref. Range 03/03/2015 07:07 03/03/2015 11:16 03/03/2015 16:15 03/03/2015 19:38 03/04/2015 07:37  Glucose-Capillary Latest Ref Range: 65-99 mg/dL 331 (H) 361 (H) 146 (H) 234 (H) 322 (H)   Results for Meghan [name], Meghan [name] (MRN [id number]) as of 03/04/2015 07:52  Ref. Range 03/01/2015 22:05  Hemoglobin A1C Latest Ref Range: 4.0-6.0 % 10.3 (H)   Diabetes history: DM2 Outpatient Diabetes medications: Onglyza 5 mg QHS Current orders for Inpatient glycemic control: Lantus 10 units QHS, Novolog 0-9 units TID with meals  Inpatient Diabetes Program Recommendations Insulin - Basal: Fasting glucose was 331 mg/dl on 7/24 and 322 mg/dl this morning. Please consider increasing Lantus to 15 units QHS. Correction (SSI): Please consider increasing Novolog correction to moderate scale and adding Novolog bedtime correction scale. Insulin - Meal Coverage: If steroids are continued as ordered, please consider ordering Novolog 4 units TID with meals for meal coverage if patient eats at least 50% of meal. HgbA1C: A1C 10.3% on 03/01/15 indicating poor glycemic control over the past 2-3 months.  03/04/15@14 :20-Spoke with patient about diabetes and home regimen for diabetes control. Patient reports that she is followed by her PCP for diabetes management and currently she takes Onglyza 5 mg QHS as an outpatient for diabetes control. Patient reports that she has not had any DM medication changes within the past 5-6 months. Patient reports that she checks her glucose 2 times per day (in the morning and at bedtime) and it is generally less than 150 mg/dl.  Discussed A1C results (10.3 on 03/01/15) and  explained current A1C indicates glucose average of 250 mg/dl. Discussed basic pathophysiology of DM Type 2, basic home care, importance of checking CBGs and maintaining good CBG control to prevent long-term and short-term complications. Discussed impact of nutrition, exercise, stress, sickness, and medications (steroids) on diabetes control.  Patient states that she takes Prednisone 5 mg daily and has taken it chronically for many years. Encouraged patient to check her glucose 3-4 times per day (before meals and at bedtime) and to keep a log of glucose values which needs to take with her to her follow up visits with her doctor. Explained how glucose values can help her doctor with making medication adjustments to improve glycemic control. Patient verbalized understanding of information discussed and she states that she has no further questions at this time related to diabetes.   Thanks, Barnie Alderman, RN, MSN, CCRN, CDE Diabetes Coordinator Inpatient Diabetes Program [phone] (Team Pager from [place] to [place]) [phone] ([place] office) [phone] [facility] office) [phone] [facility] office)

## 2015-03-04 NOTE — Progress Notes (Signed)
[name]    MR#:  [id number]  DATE OF BIRTH:  03-25-1946  SUBJECTIVE: . Patient had fever of 103 Fahrenheit yesterday associated with tachycardia and lethargy. Patient CT of the head is unremarkable except sinusitis. Patient fevers have come down. Patient was moved to telemetry yesterday secondary above. Patient is afebrile now and he is alert and oriented. She says she feels better, less cough. Does have some wheezing but overall she feels better than yesterday.   CHIEF COMPLAINT:   Chief Complaint  Patient presents with  . Hyperglycemia    REVIEW OF SYSTEMS:   Review of Systems  Constitutional: Negative for fever and chills.  HENT: Positive for sore throat. Negative for hearing loss.   Eyes: Negative for blurred vision, double vision and photophobia.  Respiratory: Positive for cough, shortness of breath and wheezing. Negative for hemoptysis.   Cardiovascular: Negative for palpitations, orthopnea and leg swelling.  Gastrointestinal: Negative for vomiting, abdominal pain and diarrhea.  Genitourinary: Negative for dysuria and urgency.  Musculoskeletal: Negative for myalgias and neck pain.  Skin: Negative for rash.  Neurological: Negative for dizziness, focal weakness, seizures, weakness and headaches.  Psychiatric/Behavioral: Negative for memory loss. The patient does not have insomnia.      DRUG ALLERGIES:   Allergies  Allergen Reactions  . Gabapentin Other (See Comments)    Pt states that it causes her BP to drop.   . Iodine Other (See Comments)    Reaction:  Syncopy  . Naproxen Hives  . Nsaids Other (See Comments)    Reaction:  Unknown     VITALS:  Blood pressure 135/89, pulse 117, temperature 97.9 F (36.6 C), temperature source Oral, resp. rate 18, height 4\' 9"  (1.448 m), weight 76.34 kg (168 lb 4.8 oz), SpO2 97 %.  PHYSICAL EXAMINATION:  GENERAL:  69 y.o.-year-old patient lying in the  bed with no acute distress.  EYES: Pupils equal, round, reactive to light and accommodation. No scleral icterus. Extraocular muscles intact.  HEENT: Head atraumatic, normocephalic. Oropharynx and nasopharynx clear.  NECK:  Supple, no jugular venous distention. No thyroid enlargement, no tenderness.  LUNGS: Bilateral expiratory wheeze in all lung fields. . No use of accessory muscles of respiration.  CARDIOVASCULAR: S1, S2 normal. No murmurs, rubs, or gallops.  ABDOMEN: Soft, nontender, nondistended. Bowel sounds present. No organomegaly or mass.  EXTREMITIES: No pedal edema, cyanosis, or clubbing.  NEUROLOGIC: Cranial nerves II through XII are intact. Muscle strength 5/5 in all extremities. Sensation intact. Gait not checked.  PSYCHIATRIC: The patient is alert and oriented x 3.  SKIN: No obvious rash, lesion, or ulcer.    LABORATORY PANEL:   CBC  Recent Labs Lab 03/04/15 0513  WBC 3.2*  HGB 10.0*  HCT 29.6*  PLT 111*   ------------------------------------------------------------------------------------------------------------------  Chemistries   Recent Labs Lab 03/01/15 2205  03/04/15 0513  NA  --   < > 133*  K  --   < > 4.1  CL  --   < > 101  CO2  --   < > 24  GLUCOSE  --   < > 278*  BUN  --   < > 15  CREATININE  --   < > 0.58  CALCIUM  --   < > 7.5*  MG 2.1  --   --   < > = values in this interval not displayed. ------------------------------------------------------------------------------------------------------------------  Cardiac Enzymes No results for input(s): TROPONINI  in the last 168 hours. ------------------------------------------------------------------------------------------------------------------  RADIOLOGY:  Dg Chest 1 View  03/03/2015   CLINICAL DATA:  Fever  EXAM: CHEST  1 VIEW  COMPARISON:  03/01/2015  FINDINGS: The heart size and mediastinal contours are within normal limits. Both lungs are clear. The visualized skeletal structures are  unremarkable. Mild right hemidiaphragmatic elevation reidentified. Indistinctness of the lung bases is likely due to crowding of the bronchovascular markings related to hypoaeration.  IMPRESSION: No active disease. If symptoms persist, consider PA and lateral chest radiographs obtained at full inspiration when the patient is clinically able.   Electronically Signed   By: Conchita Paris M.D.   On: 03/03/2015 10:18   Ct Head Wo Contrast  03/03/2015   CLINICAL DATA:  Headache for 2 days.  EXAM: CT HEAD WITHOUT CONTRAST  TECHNIQUE: Contiguous axial images were obtained from the base of the skull through the vertex without intravenous contrast.  COMPARISON:  12/24/2014  FINDINGS: There is prior right occipital craniectomy with subjacent encephalomalacia of the right cerebellar hemisphere.  There is no intracranial hemorrhage, mass or evidence of acute infarction. There is no extra-axial fluid collection. There is mild generalized atrophy. There is moderate periventricular hypodensity consistent with chronic small vessel ischemic disease.  There is near complete opacification of the left maxillary sinus with an air-fluid level. This is new from 12/24/2014.  IMPRESSION: 1. 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 2. Mild generalized atrophy and chronic small vessel ischemic changes. 3. Left maxillary sinusitis with an air-fluid level.   Electronically Signed   By: Andreas Newport M.D.   On: 03/03/2015 18:06    EKG:   Orders placed or performed during the hospital encounter of 03/01/15  . EKG  . EKG 12-Lead  . EKG 12-Lead    ASSESSMENT AND PLAN:  1. Acute asthma flare patient says she never smoked-she has a history of asthma so she has asthma flare rather than COPD flare. Continue nebulizers, steroids, oxygen. Continue nebulizers, oxygen, steroids the patient still wheezing so continue present treatment. #2 hyperglycemia secondary  to steroids continue insulin sliding scale and home medications. Hemoglobin A1c is 10. Her diabetes is not well controlled. Add NovoLog 10 units 3 times a day, increase Lantus to 15 units at night #3 hypokalemia hyponatremia: Improved  #4 epistaxis resolved #5 hypertension controlled #6 bipolar disorder continue Lamictal Fever, tachycardia and lethargy likely secondary to infection CT head showed sinusitis started on vancomycin and Zosyn for broader coverage.  All the records are reviewed and case discussed with Care Management/Social Workerr. Management plans discussed with the patient, family and they are in agreement.  CODE STATUS:full  TOTAL TIME TAKING CARE OF THIS PATIENT: 40minutes.   POSSIBLE D/C IN 1-2 DAYS, DEPENDING ON CLINICAL CONDITION.   Epifanio Lesches M.D on 03/04/2015 at 11:15 AM  Between 7am to 6pm - Pager - [phone]  After 6pm go to [email or web address] - password [email or web address] [facility]  [facility]  Office  [phone]  CC: Primary care physician; No primary care provider on file.

## 2015-03-04 NOTE — Care Management (Signed)
Patient presents form home with shortness of breath.  Admitted with exac of COPD.  Her current need for 02 is acute.  Discussed need to assessment of need for home 02 prior to discharge.  She is independent in all her adls and able to drive.  Grandson lives with her and has support of other family members. Denies issues accessing medical care or obtaining meds

## 2015-03-04 NOTE — Care Management Important Message (Signed)
Important Message  Patient Details  Name: LENNY BOUCHILLON MRN: [id number] Date of Birth: 1946-06-06   Medicare Important Message Given:  Yes-second notification given    [name] A Allmond 03/04/2015, 11:52 AM

## 2015-03-05 ENCOUNTER — Inpatient Hospital Stay: Payer: Medicare Other

## 2015-03-05 LAB — GLUCOSE, CAPILLARY
Glucose-Capillary: 297 mg/dL — ABNORMAL HIGH (ref 65–99)
Glucose-Capillary: 304 mg/dL — ABNORMAL HIGH (ref 65–99)
Glucose-Capillary: 120 mg/dL — ABNORMAL HIGH (ref 65–99)
Glucose-Capillary: 202 mg/dL — ABNORMAL HIGH (ref 65–99)

## 2015-03-05 MED ORDER — INSULIN ASPART 100 UNIT/ML ~~LOC~~ SOLN
8.0000 [IU] | Freq: Three times a day (TID) | SUBCUTANEOUS | Status: DC
  Administered 2015-03-05 – 2015-03-06 (×3): 8 [IU] via SUBCUTANEOUS
  Filled 2015-03-05 (×4): qty 8

## 2015-03-05 MED ORDER — INSULIN GLARGINE 100 UNIT/ML ~~LOC~~ SOLN
17.0000 [IU] | Freq: Every day | SUBCUTANEOUS | Status: DC
  Administered 2015-03-05: 17 [IU] via SUBCUTANEOUS
  Filled 2015-03-05 (×2): qty 0.17

## 2015-03-05 NOTE — Progress Notes (Signed)
[facility] at [place] NAME: Meghan [name]    MR#:  [id number]  DATE OF BIRTH:  28-Dec-1945  SUBJECTIVE: Seen today patient does have some cough and wheezing but overall better than yesterday.   CHIEF COMPLAINT:   Chief Complaint  Patient presents with  . Hyperglycemia    REVIEW OF SYSTEMS:   Review of Systems  Constitutional: Negative for fever and chills.  HENT: Negative for hearing loss and sore throat.   Eyes: Negative for blurred vision, double vision and photophobia.  Respiratory: Positive for cough, shortness of breath and wheezing. Negative for hemoptysis.   Cardiovascular: Negative for palpitations, orthopnea and leg swelling.  Gastrointestinal: Negative for vomiting, abdominal pain and diarrhea.  Genitourinary: Negative for dysuria and urgency.  Musculoskeletal: Negative for myalgias and neck pain.  Skin: Negative for rash.  Neurological: Negative for dizziness, focal weakness, seizures, weakness and headaches.  Psychiatric/Behavioral: Negative for memory loss. The patient does not have insomnia.      DRUG ALLERGIES:   Allergies  Allergen Reactions  . Gabapentin Other (See Comments)    Pt states that it causes her BP to drop.   . Iodine Other (See Comments)    Reaction:  Syncopy  . Naproxen Hives  . Nsaids Other (See Comments)    Reaction:  Unknown     VITALS:  Blood pressure 131/77, pulse 103, temperature 97.8 F (36.6 C), temperature source Oral, resp. rate 22, height 4\' 9"  (1.448 m), weight 76.34 kg (168 lb 4.8 oz), SpO2 98 %.  PHYSICAL EXAMINATION:  GENERAL:  69 y.o.-year-old patient lying in the bed with no acute distress.  EYES: Pupils equal, round, reactive to light and accommodation. No scleral icterus. Extraocular muscles intact.  HEENT: Head atraumatic, normocephalic. Oropharynx and nasopharynx clear.  NECK:  Supple, no jugular venous distention. No thyroid enlargement, no tenderness.  LUNGS:  Bilateral expiratory wheeze in all lung fields. . No use of accessory muscles of respiration.  CARDIOVASCULAR: S1, S2 normal. No murmurs, rubs, or gallops.  ABDOMEN: Soft, nontender, nondistended. Bowel sounds present. No organomegaly or mass.  EXTREMITIES: No pedal edema, cyanosis, or clubbing.  NEUROLOGIC: Cranial nerves II through XII are intact. Muscle strength 5/5 in all extremities. Sensation intact. Gait not checked.  PSYCHIATRIC: The patient is alert and oriented x 3.  SKIN: No obvious rash, lesion, or ulcer.    LABORATORY PANEL:   CBC  Recent Labs Lab 03/04/15 0513  WBC 3.2*  HGB 10.0*  HCT 29.6*  PLT 111*   ------------------------------------------------------------------------------------------------------------------  Chemistries   Recent Labs Lab 03/01/15 2205  03/04/15 0513  NA  --   < > 133*  K  --   < > 4.1  CL  --   < > 101  CO2  --   < > 24  GLUCOSE  --   < > 278*  BUN  --   < > 15  CREATININE  --   < > 0.58  CALCIUM  --   < > 7.5*  MG 2.1  --   --   < > = values in this interval not displayed. ------------------------------------------------------------------------------------------------------------------  Cardiac Enzymes No results for input(s): TROPONINI in the last 168 hours. ------------------------------------------------------------------------------------------------------------------  RADIOLOGY:  Ct Head Wo Contrast  03/03/2015   CLINICAL DATA:  Headache for 2 days.  EXAM: CT HEAD WITHOUT CONTRAST  TECHNIQUE: Contiguous axial images were obtained from the base of the skull through the vertex without intravenous contrast.  COMPARISON:  12/24/2014  FINDINGS: There is prior right occipital craniectomy with subjacent encephalomalacia of the right cerebellar hemisphere.  There is no intracranial hemorrhage, mass or evidence of acute infarction. There is no extra-axial fluid collection. There is mild generalized atrophy. There is moderate  periventricular hypodensity consistent with chronic small vessel ischemic disease.  There is near complete opacification of the left maxillary sinus with an air-fluid level. This is new from 12/24/2014.  IMPRESSION: 1. 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 2. Mild generalized atrophy and chronic small vessel ischemic changes. 3. Left maxillary sinusitis with an air-fluid level.   Electronically Signed   By: Andreas Newport M.D.   On: 03/03/2015 18:06    EKG:   Orders placed or performed during the hospital encounter of 03/01/15  . EKG  . EKG 12-Lead  . EKG 12-Lead    ASSESSMENT AND PLAN:  1. Acute asthma flare patient says she never smoked-she has a history of asthma so she has asthma flare rather than COPD flare. Continue nebulizers, steroids, oxygen. Continue nebulizers, oxygen, steroids the patient still wheezing so continue present treatment. Repeat chest x-ray today #2 hyperglycemia secondary to steroids : continue insulin sliding scale and home medications. Hemoglobin A1c is 10. Her diabetes is not well controlled. Add NovoLog 10 units 3 times a day, increase Lantus to 15 units at nigh t #3 hypokalemia hyponatremia: Improved  #4 epistaxis resolved  #5 hypertension: controlled #6 bipolar disorder continue Lamictal Acute sinusitis continue IV antibiotics.  All the records are reviewed and case discussed with Care Management/Social Workerr. Management plans discussed with the patient, family and they are in agreement.  CODE STATUS:full  TOTAL TIME TAKING CARE OF THIS PATIENT: 68minutes.   POSSIBLE D/C IN 1-2 DAYS, DEPENDING ON CLINICAL CONDITION.   Epifanio Lesches M.D on 03/05/2015 at 10:58 AM  Between 7am to 6pm - Pager - [phone]  After 6pm go to [email or web address] - password [email or web address] [facility]  [place] [facility]  Office  [phone]  CC: Primary care physician; No primary care provider on  file.

## 2015-03-05 NOTE — Progress Notes (Signed)
ANTIBIOTIC CONSULT NOTE - INITIAL  Pharmacy Consult for Vancomycin and Zosyn Indication: sinusitis  Allergies  Allergen Reactions  . Gabapentin Other (See Comments)    Pt states that it causes her BP to drop.   . Iodine Other (See Comments)    Reaction:  Syncopy  . Naproxen Hives  . Nsaids Other (See Comments)    Reaction:  Unknown     Patient Measurements: Height: 4\' 9"  (144.8 cm) Weight: 168 lb 4.8 oz (76.34 kg) IBW/kg (Calculated) : 38.6 Adjusted Body Weight: 57.8 kg  Vital Signs: Temp: 97.8 F (36.6 C) (07/26 0411) Temp Source: Oral (07/26 0411) BP: 131/77 mmHg (07/26 0411) Pulse Rate: 103 (07/26 0411) Intake/Output from previous day: 07/25 0701 - 07/26 0700 In: 360 [P.O.:360] Out: 451 [Urine:450; Stool:1] Intake/Output from this shift:    Labs:  Recent Labs  03/04/15 0513  WBC 3.2*  HGB 10.0*  PLT 111*  CREATININE 0.58   Estimated Creatinine Clearance: 57.1 mL/min (by C-G formula based on Cr of 0.58). No results for input(s): VANCOTROUGH, VANCOPEAK, VANCORANDOM, GENTTROUGH, GENTPEAK, GENTRANDOM, TOBRATROUGH, TOBRAPEAK, TOBRARND, AMIKACINPEAK, AMIKACINTROU, AMIKACIN in the last 72 hours.   Microbiology: Recent Results (from the past 720 hour(s))  Culture, blood (routine x 2)     Status: None (Preliminary result)   Collection Time: 03/03/15  3:56 PM  Result Value Ref Range Status   Specimen Description BLOOD RIGHT HAND  Final   Special Requests BOTTLES DRAWN AEROBIC ONLY  1CC  Final   Culture NO GROWTH 2 DAYS  Final   Report Status PENDING  Incomplete  Culture, blood (routine x 2)     Status: None (Preliminary result)   Collection Time: 03/03/15  4:15 PM  Result Value Ref Range Status   Specimen Description BLOOD RIGHT ASSIST CONTROL  Final   Special Requests BOTTLES DRAWN AEROBIC AND ANAEROBIC  5CC  Final   Culture NO GROWTH 2 DAYS  Final   Report Status PENDING  Incomplete    Medical History: Past Medical History  Diagnosis Date  . COPD  (chronic obstructive pulmonary disease)   . Diabetes mellitus   . Hypertension   . Depression   . Anxiety   . Arthritis     RA  . GERD (gastroesophageal reflux disease)   . Osteopenia   . DDD (degenerative disc disease)   . Obese   . PTSD (post-traumatic stress disorder)     Medications:  Anti-infectives    Start     Dose/Rate Route Frequency Ordered Stop   03/04/15 0600  vancomycin (VANCOCIN) IVPB 1000 mg/200 mL premix     1,000 mg 200 mL/hr over 60 Minutes Intravenous Every 18 hours 03/03/15 1932     03/03/15 1800  vancomycin (VANCOCIN) IVPB 1000 mg/200 mL premix     1,000 mg 200 mL/hr over 60 Minutes Intravenous  Once 03/03/15 1724 03/03/15 1929   03/03/15 1730  piperacillin-tazobactam (ZOSYN) IVPB 3.375 g     3.375 g 12.5 mL/hr over 240 Minutes Intravenous 3 times per day 03/03/15 1724     03/03/15 0945  azithromycin (ZITHROMAX) 500 mg in dextrose 5 % 250 mL IVPB  Status:  Discontinued     500 mg 250 mL/hr over 60 Minutes Intravenous Every 24 hours 03/03/15 0943 03/03/15 1654   03/03/15 0815  cefTRIAXone (ROCEPHIN) 1 g in dextrose 5 % 50 mL IVPB  Status:  Discontinued     1 g 100 mL/hr over 30 Minutes Intravenous Every 24 hours 03/03/15 0746 03/04/15 1121  03/03/15 0745  cefTRIAXone (ROCEPHIN) 1 g in dextrose 5 % 50 mL IVPB - Premix  Status:  Discontinued     1 g 100 mL/hr over 30 Minutes Intravenous Every 24 hours 03/03/15 0737 03/03/15 0743   03/01/15 1945  azithromycin (ZITHROMAX) 500 mg in dextrose 5 % 250 mL IVPB     500 mg 250 mL/hr over 60 Minutes Intravenous  Once 03/01/15 1941 03/02/15 0004     Assessment: Patient admitted for asthma exacerbation. Initially started on Ceftriaxone and Azithromycin.   Patient now spiking fevers and MD has ordered pharmacy consult for Vancomycin and Zosyn. Now with evidence of sinusitis on CT per MD note  Noted that patient is currently taking Xeljanz (toficitanib) which can cause serious infections.   Goal of Therapy:   Vancomycin trough level 15-20 mcg/ml Resolution of symptoms  Plan:  Measure antibiotic drug levels at steady state Follow up culture results Will order Zosyn 3.375g IV q8h EI and Vancomycin 1g IV q18h (starting 12 hours after initial dose for stacked dosing). Will check a trough level prior to 5th dose.   Paged MD Edwina Barth) about stopping Morrie Sheldon, he agreeded. Will need to follow up on whether this should be continued at discharge.   Rexene Edison, PharmD Clinical Pharmacist   03/05/2015 9:26 AM

## 2015-03-05 NOTE — Progress Notes (Signed)
Patient ambulated around nurses station 2x (approx. 423ft) on RA. Tolerated well, no complaints of SOB, dizziness or otherwise. O2 sats remained at 93% or above on ambulation.

## 2015-03-05 NOTE — Progress Notes (Signed)
Inpatient Diabetes Program Recommendations  AACE/ADA: New Consensus Statement on Inpatient Glycemic Control (2013)  Target Ranges:  Prepandial:   less than 140 mg/dL      Peak postprandial:   less than 180 mg/dL (1-2 hours)      Critically ill patients:  140 - 180 mg/dL   Results for Meghan [name], Meghan [name] (MRN [id number]) as of 03/05/2015 09:01  Ref. Range 03/04/2015 07:37 03/04/2015 11:29 03/04/2015 16:25 03/04/2015 20:43 03/05/2015 07:14  Glucose-Capillary Latest Ref Range: 65-99 mg/dL 322 (H) 192 (H) 143 (H) 187 (H) 297 (H)   Diabetes history: DM2 Outpatient Diabetes medications: Onglyza 5 mg QHS Current orders for Inpatient glycemic control: Lantus 15 units QHS, Novolog 0-9 units TID with meals, Novolog 5 units TID with meals for meal coverage  Inpatient Diabetes Program Recommendations Insulin - Basal: Please consider increasing Lantus to 20 units QHS. HgbA1C: A1C 10.3% on 03/01/15 indicating poor glycemic control over the past 2-3 months.  Note: While inpatient, patient has required insulin with steroid use. Note patient's A1C is 10.3% which indicates poor outpatient glycemic control and patient would benefit from an additional DM medication. Please indicate in note, inform patient, and inform nursing staff if plan will be to discharge patient on insulin so that patient can be educated prior to discharge.  Thanks, Barnie Alderman, RN, MSN, CCRN, CDE Diabetes Coordinator Inpatient Diabetes Program [phone] (Team Pager from [place] to [place]) [phone] ([facility] office) [phone] [facility] office) [phone] [facility] office)

## 2015-03-06 LAB — CREATININE, SERUM
CREATININE: 0.6 mg/dL (ref 0.44–1.00)
GFR calc Af Amer: >60 mL/min (ref >60)

## 2015-03-06 LAB — VANCOMYCIN, TROUGH: VANCOMYCIN TR: 7 ug/mL — AB (ref 10–20)

## 2015-03-06 LAB — GLUCOSE, CAPILLARY
GLUCOSE-CAPILLARY: 432 mg/dL — AB (ref 65–99)
Glucose-Capillary: 288 mg/dL — ABNORMAL HIGH (ref 65–99)
Glucose-Capillary: 100 mg/dL — ABNORMAL HIGH (ref 65–99)
Glucose-Capillary: 286 mg/dL — ABNORMAL HIGH (ref 65–99)

## 2015-03-06 MED ORDER — VANCOMYCIN HCL 10 G IV SOLR
1250.0000 mg | Freq: Two times a day (BID) | INTRAVENOUS | Status: DC
  Administered 2015-03-06 – 2015-03-08 (×5): 1250 mg via INTRAVENOUS
  Filled 2015-03-06 (×8): qty 1250

## 2015-03-06 MED ORDER — INSULIN GLARGINE 100 UNIT/ML ~~LOC~~ SOLN
20.0000 [IU] | Freq: Every day | SUBCUTANEOUS | Status: DC
  Administered 2015-03-06 – 2015-03-09 (×4): 20 [IU] via SUBCUTANEOUS
  Filled 2015-03-06 (×5): qty 0.2

## 2015-03-06 MED ORDER — INSULIN ASPART 100 UNIT/ML ~~LOC~~ SOLN
12.0000 [IU] | Freq: Three times a day (TID) | SUBCUTANEOUS | Status: DC
  Administered 2015-03-07 – 2015-03-09 (×7): 12 [IU] via SUBCUTANEOUS
  Filled 2015-03-06 (×8): qty 12

## 2015-03-06 MED ORDER — INSULIN ASPART 100 UNIT/ML ~~LOC~~ SOLN
10.0000 [IU] | SUBCUTANEOUS | Status: AC
  Administered 2015-03-06: 10 [IU] via SUBCUTANEOUS

## 2015-03-06 MED ORDER — INSULIN STARTER KIT- PEN NEEDLES (ENGLISH)
1.0000 | Freq: Once | Status: DC
  Filled 2015-03-06 (×2): qty 1

## 2015-03-06 NOTE — Progress Notes (Signed)
[place] at [place] NAME: Meghan [name]    MR#:  [id number]  DATE OF BIRTH:  April 16, 1946  SUBJECTIVE: Still coughing has brown sputum.   CHIEF COMPLAINT:   Chief Complaint  Patient presents with  . Hyperglycemia    REVIEW OF SYSTEMS:   Review of Systems  Constitutional: Negative for fever and chills.  HENT: Negative for hearing loss and sore throat.   Eyes: Negative for blurred vision, double vision and photophobia.  Respiratory: Positive for cough, shortness of breath and wheezing. Negative for hemoptysis.   Cardiovascular: Negative for palpitations, orthopnea and leg swelling.  Gastrointestinal: Negative for vomiting, abdominal pain and diarrhea.  Genitourinary: Negative for dysuria and urgency.  Musculoskeletal: Negative for myalgias and neck pain.  Skin: Negative for rash.  Neurological: Negative for dizziness, focal weakness, seizures, weakness and headaches.  Psychiatric/Behavioral: Negative for memory loss. The patient does not have insomnia.      DRUG ALLERGIES:   Allergies  Allergen Reactions  . Gabapentin Other (See Comments)    Pt states that it causes her BP to drop.   . Iodine Other (See Comments)    Reaction:  Syncopy  . Naproxen Hives  . Nsaids Other (See Comments)    Reaction:  Unknown     VITALS:  Blood pressure 159/97, pulse 122, temperature 98.7 F (37.1 C), temperature source Oral, resp. rate 22, height 4\' 9"  (1.448 m), weight 76.34 kg (168 lb 4.8 oz), SpO2 97 %.  PHYSICAL EXAMINATION:  GENERAL:  69 y.o.-year-old patient lying in the bed with no acute distress.  EYES: Pupils equal, round, reactive to light and accommodation. No scleral icterus. Extraocular muscles intact.  HEENT: Head atraumatic, normocephalic. Oropharynx and nasopharynx clear.  NECK:  Supple, no jugular venous distention. No thyroid enlargement, no tenderness.  LUNGS: Bilateral expiratory wheeze in all lung fields. . No use of  accessory muscles of respiration.  CARDIOVASCULAR: S1, S2 normal. No murmurs, rubs, or gallops.  ABDOMEN: Soft, nontender, nondistended. Bowel sounds present. No organomegaly or mass.  EXTREMITIES: No pedal edema, cyanosis, or clubbing.  NEUROLOGIC: Cranial nerves II through XII are intact. Muscle strength 5/5 in all extremities. Sensation intact. Gait not checked.  PSYCHIATRIC: The patient is alert and oriented x 3.  SKIN: No obvious rash, lesion, or ulcer.    LABORATORY PANEL:   CBC  Recent Labs Lab 03/04/15 0513  WBC 3.2*  HGB 10.0*  HCT 29.6*  PLT 111*   ------------------------------------------------------------------------------------------------------------------  Chemistries   Recent Labs Lab 03/01/15 2205  03/04/15 0513 03/06/15 0443  NA  --   < > 133*  --   K  --   < > 4.1  --   CL  --   < > 101  --   CO2  --   < > 24  --   GLUCOSE  --   < > 278*  --   BUN  --   < > 15  --   CREATININE  --   < > 0.58 0.60  CALCIUM  --   < > 7.5*  --   MG 2.1  --   --   --   < > = values in this interval not displayed. ------------------------------------------------------------------------------------------------------------------  Cardiac Enzymes No results for input(s): TROPONINI in the last 168 hours. ------------------------------------------------------------------------------------------------------------------  RADIOLOGY:  Dg Chest 1 View  03/05/2015   CLINICAL DATA:  Increased shortness of breath.  COPD.  EXAM: CHEST  1 VIEW  COMPARISON:  03/03/2015  FINDINGS: Normal heart size. There is asymmetric elevation of the right hemidiaphragm. No pleural effusion or edema identified. No airspace consolidation. The visualized bony structures appear intact.  IMPRESSION: 1. Asymmetric elevation of right hemidiaphragm. 2. No acute findings.   Electronically Signed   By: Kerby Moors M.D.   On: 03/05/2015 11:36    EKG:   Orders placed or performed during the hospital  encounter of 03/01/15  . EKG  . EKG 12-Lead  . EKG 12-Lead    ASSESSMENT AND PLAN:  1. Acute asthma flare patient says she never smoked-she has a history of asthma so she has asthma flare rather than COPD flare. Early improving, continue IV steroids, nebulizers, IV antibiotics.. . #2 hyperglycemia secondary to steroids : Adjusted the Lantus to 20 units at night, NovoLog 12 units 3 times a day 3 times a day. And will still use the correction sliding scale. Explained to the patient that she'll be needing insulin at discharge. t #3 hypokalemia hyponatremia: Improved  #4 epistaxis resolved  #5 hypertension: controlled, tachycardic heart rate up to 1 12 bpm: Add Cardizem 60 MG every 6 hours #6 bipolar disorder continue Lamictal Acute sinusitis continue IV antibiotics.  All the records are reviewed and case discussed with Care Management/Social Workerr. Management plans discussed with the patient, family and they are in agreement.  CODE STATUS:full  TOTAL TIME TAKING CARE OF THIS PATIENT: 1minutes.   POSSIBLE D/C IN 1-2 DAYS, DEPENDING ON CLINICAL CONDITION.   Epifanio Lesches M.D on 03/06/2015 at 12:02 PM  Between 7am to 6pm - Pager - [phone]  After 6pm go to [email or web address] - password [email or web address] [facility]  [place] [facility]  Office  [phone]  CC: Primary care physician; No primary care provider on file.

## 2015-03-06 NOTE — Care Management (Addendum)
Barrier to discharge are elevated blood sugars.  It is reported during progression that patient may require to be discharged on insulin.  Her exertional room air sats are 93.  At present, do not anticipate need for home 02.  Continue to recommend home health nursing at discharge.  Heads up referral to [facility]

## 2015-03-06 NOTE — Progress Notes (Signed)
ANTIBIOTIC CONSULT NOTE - FOLLOW UP   Pharmacy Consult for Vancomycin and Zosyn Indication: sinusitis/possible sepsis   Allergies  Allergen Reactions  . Gabapentin Other (See Comments)    Pt states that it causes her BP to drop.   . Iodine Other (See Comments)    Reaction:  Syncopy  . Naproxen Hives  . Nsaids Other (See Comments)    Reaction:  Unknown     Patient Measurements: Height: 4\' 9"  (144.8 cm) Weight: 168 lb 4.8 oz (76.34 kg) IBW/kg (Calculated) : 38.6 Adjusted Body Weight: 57.8 kg  Vital Signs: Temp: 98.7 F (37.1 C) (07/27 1053) Temp Source: Oral (07/27 1053) BP: 159/97 mmHg (07/27 1053) Pulse Rate: 122 (07/27 1053) Intake/Output from previous day: 07/26 0701 - 07/27 0700 In: 3468.3 [P.O.:720; I.V.:2498.3; IV Piggyback:250] Out: 851 [Urine:850; Stool:1] Intake/Output from this shift: Total I/O In: 240 [P.O.:240] Out: 600 [Urine:600]  Labs:  Recent Labs  03/04/15 0513 03/06/15 0443  WBC 3.2*  --   HGB 10.0*  --   PLT 111*  --   CREATININE 0.58 0.60   Estimated Creatinine Clearance: 57.1 mL/min (by C-G formula based on Cr of 0.6).  Recent Labs  03/06/15 1120  [place] 7*     Microbiology: Recent Results (from the past 720 hour(s))  Culture, blood (routine x 2)     Status: None (Preliminary result)   Collection Time: 03/03/15  3:56 PM  Result Value Ref Range Status   Specimen Description BLOOD RIGHT HAND  Final   Special Requests BOTTLES DRAWN AEROBIC ONLY  1CC  Final   Culture NO GROWTH 3 DAYS  Final   Report Status PENDING  Incomplete  Culture, blood (routine x 2)     Status: None (Preliminary result)   Collection Time: 03/03/15  4:15 PM  Result Value Ref Range Status   Specimen Description BLOOD RIGHT ASSIST CONTROL  Final   Special Requests BOTTLES DRAWN AEROBIC AND ANAEROBIC  5CC  Final   Culture NO GROWTH 3 DAYS  Final   Report Status PENDING  Incomplete    Medical History: Past Medical History  Diagnosis Date  . COPD  (chronic obstructive pulmonary disease)   . Diabetes mellitus   . Hypertension   . Depression   . Anxiety   . Arthritis     RA  . GERD (gastroesophageal reflux disease)   . Osteopenia   . DDD (degenerative disc disease)   . Obese   . PTSD (post-traumatic stress disorder)     Medications:  Anti-infectives    Start     Dose/Rate Route Frequency Ordered Stop   03/06/15 2300  vancomycin (VANCOCIN) 1,250 mg in sodium chloride 0.9 % 250 mL IVPB     1,250 mg 166.7 mL/hr over 90 Minutes Intravenous Every 12 hours 03/06/15 1257     03/04/15 0600  vancomycin (VANCOCIN) IVPB 1000 mg/200 mL premix     1,000 mg 200 mL/hr over 60 Minutes Intravenous Every 18 hours 03/03/15 1932 03/06/15 1313   03/03/15 1800  vancomycin (VANCOCIN) IVPB 1000 mg/200 mL premix     1,000 mg 200 mL/hr over 60 Minutes Intravenous  Once 03/03/15 1724 03/03/15 1929   03/03/15 1730  piperacillin-tazobactam (ZOSYN) IVPB 3.375 g     3.375 g 12.5 mL/hr over 240 Minutes Intravenous 3 times per day 03/03/15 1724     03/03/15 0945  azithromycin (ZITHROMAX) 500 mg in dextrose 5 % 250 mL IVPB  Status:  Discontinued     500 mg 250  mL/hr over 60 Minutes Intravenous Every 24 hours 03/03/15 0943 03/03/15 1654   03/03/15 0815  cefTRIAXone (ROCEPHIN) 1 g in dextrose 5 % 50 mL IVPB  Status:  Discontinued     1 g 100 mL/hr over 30 Minutes Intravenous Every 24 hours 03/03/15 0746 03/04/15 1121   03/03/15 0745  cefTRIAXone (ROCEPHIN) 1 g in dextrose 5 % 50 mL IVPB - Premix  Status:  Discontinued     1 g 100 mL/hr over 30 Minutes Intravenous Every 24 hours 03/03/15 0737 03/03/15 0743   03/01/15 1945  azithromycin (ZITHROMAX) 500 mg in dextrose 5 % 250 mL IVPB     500 mg 250 mL/hr over 60 Minutes Intravenous  Once 03/01/15 1941 03/02/15 0004     Assessment: Patient admitted for asthma exacerbation. Initially started on Ceftriaxone and Azithromycin.   Patient now spiking fevers and MD has ordered pharmacy consult for Vancomycin and  Zosyn. Now with evidence of sinusitis on CT per MD note  Noted that patient is currently taking Xeljanz (toficitanib) which can cause serious infections.   Goal of Therapy:  Vancomycin trough level 15-20 mcg/ml Resolution of symptoms  Plan:  Patient had Vancomycin level result @ 35mcg/ml. Will increase dose to Vancomycin 1250 mg IV q12 hours. Will order Vancomycin level to be drawn prior to the 1100 dose on 7/29.  Zosyn: Continue Zosyn 3.375 g IV q8 hours.   [name], PharmD  Clinical Pharmacist   03/06/2015 2:20 PM

## 2015-03-06 NOTE — Care Management Important Message (Signed)
Important Message  Patient Details  Name: [name] MRN: [id number] Date of Birth: [birth year]   Medicare Important Message Given:  Yes-third notification given    [name] A Allmond 03/06/2015, 11:04 AM

## 2015-03-06 NOTE — Progress Notes (Signed)
Inpatient Diabetes Program Recommendations  AACE/ADA: New Consensus Statement on Inpatient Glycemic Control (2013)  Target Ranges:  Prepandial:   less than 140 mg/dL      Peak postprandial:   less than 180 mg/dL (1-2 hours)      Critically ill patients:  140 - 180 mg/dL   Results for Meghan [name], Meghan [name] (MRN [id number]) as of 03/06/2015 08:00  Ref. Range 03/05/2015 07:14 03/05/2015 11:36 03/05/2015 16:10 03/05/2015 20:41 03/06/2015 07:15  Glucose-Capillary Latest Ref Range: 65-99 mg/dL 297 (H) 304 (H) 120 (H) 202 (H) 288 (H)   Diabetes history: DM2 Outpatient Diabetes medications: Onglyza 5 mg QHS Current orders for Inpatient glycemic control: Lantus 17 units QHS, Novolog 0-9 units TID with meals, Novolog 8 units TID with meals for meal coverage  Inpatient Diabetes Program Recommendations Insulin - Basal: Please consider increasing Lantus to 20 units QHS. Correction (SSI): Please consider increasing Novolog correction to moderate scale and ADDING Novolog bedtime correction scale. Insulin - Meal Coverage: Please consider increasing Novolog meal coverage to 12 units TID with meals if steroids are continued as ordered (Solumedrol 60 mg Q12H). HgbA1C: A1C 10.3% on 03/01/15 indicating poor glycemic control over the past 2-3 months. MD, please make reference to discharge plan for diabetes control on progress note (will patient discharge on insulin?) and information patient and nursing staff so that patient can be educated prior to discharge.  Thanks, Barnie Alderman, RN, MSN, CCRN, CDE Diabetes Coordinator Inpatient Diabetes Program [phone] (Team Pager from [place] to [place]) [phone] ([place] office) [phone] [facility] office) [phone] [facility] office)

## 2015-03-06 NOTE — Progress Notes (Signed)
   03/06/15 2100  Clinical Encounter Type  Visited With Patient  Visit Type Spiritual support  Spiritual Encounters  Spiritual Needs Emotional  Stress Factors  Patient Stress Factors Major life changes  Family Stress Factors None identified   Status: alert and oriented/in bed on phone  Family: none present  Age/Sex: female 9 Faith: Christian Visit Assessment: The patient shared that she needed Adv Dir education and was presented with the documents. She shared that everything is paid for and her granddaughter gets the car. She added that she has flim in her throat and that she was feeling yucky during the visit earlier.  Pastoral care can be reached via pager [phone] or by submitting an online request.

## 2015-03-07 LAB — GLUCOSE, CAPILLARY
Glucose-Capillary: 218 mg/dL — ABNORMAL HIGH (ref 65–99)
Glucose-Capillary: 374 mg/dL — ABNORMAL HIGH (ref 65–99)
Glucose-Capillary: 186 mg/dL — ABNORMAL HIGH (ref 65–99)
Glucose-Capillary: 165 mg/dL — ABNORMAL HIGH (ref 65–99)

## 2015-03-07 MED ORDER — BENZONATATE 100 MG PO CAPS
200.0000 mg | ORAL_CAPSULE | Freq: Three times a day (TID) | ORAL | Status: DC | PRN
  Administered 2015-03-07: 200 mg via ORAL
  Filled 2015-03-07: qty 2

## 2015-03-07 MED ORDER — METHYLPREDNISOLONE SODIUM SUCC 125 MG IJ SOLR
60.0000 mg | Freq: Every day | INTRAMUSCULAR | Status: DC
  Administered 2015-03-08 – 2015-03-09 (×2): 60 mg via INTRAVENOUS
  Filled 2015-03-07 (×2): qty 2

## 2015-03-07 MED ORDER — DILTIAZEM HCL 60 MG PO TABS
30.0000 mg | ORAL_TABLET | Freq: Four times a day (QID) | ORAL | Status: DC
  Administered 2015-03-07 – 2015-03-09 (×7): 30 mg via ORAL
  Filled 2015-03-07 (×7): qty 1

## 2015-03-07 MED ORDER — LEVALBUTEROL HCL 1.25 MG/0.5ML IN NEBU
1.2500 mg | INHALATION_SOLUTION | Freq: Four times a day (QID) | RESPIRATORY_TRACT | Status: DC
  Administered 2015-03-07 – 2015-03-09 (×7): 1.25 mg via RESPIRATORY_TRACT
  Filled 2015-03-07 (×9): qty 0.5

## 2015-03-07 MED ORDER — SODIUM CHLORIDE 0.9 % IV SOLN
INTRAVENOUS | Status: DC
  Administered 2015-03-07: via INTRAVENOUS

## 2015-03-07 MED ORDER — HYDROCHLOROTHIAZIDE 12.5 MG PO CAPS
12.5000 mg | ORAL_CAPSULE | Freq: Two times a day (BID) | ORAL | Status: DC
  Administered 2015-03-07 – 2015-03-09 (×6): 12.5 mg via ORAL
  Filled 2015-03-07 (×6): qty 1

## 2015-03-07 NOTE — Progress Notes (Signed)
Patient alert and oriented x4, no complaints at this time. vss at this time. Patient NSR on telemetry. Will continue to assess. Meghan [name] R [place]  

## 2015-03-07 NOTE — Progress Notes (Signed)
[place] at [place] NAME: Meghan [name]    MR#:  [id number]  DATE OF BIRTH:  [birth year]  SUBJECTIVE: Complains of cough unable to get the phlegm out.   CHIEF COMPLAINT:   Chief Complaint  Patient presents with  . Hyperglycemia    REVIEW OF SYSTEMS:   Review of Systems  Constitutional: Negative for fever and chills.  HENT: Negative for hearing loss and sore throat.   Eyes: Negative for blurred vision, double vision and photophobia.  Respiratory: Positive for cough. Negative for hemoptysis.   Cardiovascular: Negative for palpitations, orthopnea and leg swelling.  Gastrointestinal: Negative for vomiting, abdominal pain and diarrhea.  Genitourinary: Negative for dysuria and urgency.  Musculoskeletal: Negative for myalgias and neck pain.  Skin: Negative for rash.  Neurological: Negative for dizziness, focal weakness, seizures, weakness and headaches.  Psychiatric/Behavioral: Negative for memory loss. The patient does not have insomnia.      DRUG ALLERGIES:   Allergies  Allergen Reactions  . Gabapentin Other (See Comments)    Pt states that it causes her BP to drop.   . Iodine Other (See Comments)    Reaction:  Syncopy  . Naproxen Hives  . Nsaids Other (See Comments)    Reaction:  Unknown     VITALS:  Blood pressure 146/99, pulse 119, temperature 98.8 F (37.1 C), temperature source Oral, resp. rate 23, height 4\' 9"  (1.448 m), weight 76.34 kg (168 lb 4.8 oz), SpO2 98 %.  PHYSICAL EXAMINATION:  GENERAL:  69 y.o.-year-old patient lying in the bed with no acute distress.  EYES: Pupils equal, round, reactive to light and accommodation. No scleral icterus. Extraocular muscles intact.  HEENT: Head atraumatic, normocephalic. Oropharynx and nasopharynx clear.  NECK:  Supple, no jugular venous distention. No thyroid enlargement, no tenderness.  LUNGS: Bilateral expiratory wheeze in all lung fields. . No use of accessory muscles of  respiration.  CARDIOVASCULAR: S1, S2 normal. No murmurs, rubs, or gallops.  ABDOMEN: Soft, nontender, nondistended. Bowel sounds present. No organomegaly or mass.  EXTREMITIES: No pedal edema, cyanosis, or clubbing.  NEUROLOGIC: Cranial nerves II through XII are intact. Muscle strength 5/5 in all extremities. Sensation intact. Gait not checked.  PSYCHIATRIC: The patient is alert and oriented x 3.  SKIN: No obvious rash, lesion, or ulcer.    LABORATORY PANEL:   CBC  Recent Labs Lab 03/04/15 0513  WBC 3.2*  HGB 10.0*  HCT 29.6*  PLT 111*   ------------------------------------------------------------------------------------------------------------------  Chemistries   Recent Labs Lab 03/01/15 2205  03/04/15 0513 03/06/15 0443  NA  --   < > 133*  --   K  --   < > 4.1  --   CL  --   < > 101  --   CO2  --   < > 24  --   GLUCOSE  --   < > 278*  --   BUN  --   < > 15  --   CREATININE  --   < > 0.58 0.60  CALCIUM  --   < > 7.5*  --   MG 2.1  --   --   --   < > = values in this interval not displayed. ------------------------------------------------------------------------------------------------------------------  Cardiac Enzymes No results for input(s): TROPONINI in the last 168 hours. ------------------------------------------------------------------------------------------------------------------  RADIOLOGY:  Dg Chest 1 View  03/05/2015   CLINICAL DATA:  Increased shortness of breath.  COPD.  EXAM: CHEST  1 VIEW  COMPARISON:  03/03/2015  FINDINGS: Normal heart size. There is asymmetric elevation of the right hemidiaphragm. No pleural effusion or edema identified. No airspace consolidation. The visualized bony structures appear intact.  IMPRESSION: 1. Asymmetric elevation of right hemidiaphragm. 2. No acute findings.   Electronically Signed   By: Kerby Moors M.D.   On: 03/05/2015 11:36    EKG:   Orders placed or performed during the hospital encounter of 03/01/15  .  EKG  . EKG 12-Lead  . EKG 12-Lead    ASSESSMENT AND PLAN:  1. Acute asthma flare patient says she never smoked-she has a history of asthma so she has asthma flare rather than COPD flare.  Slowly improving continue steroids, nebulizers, and  Antibiotics. #2 hyperglycemia secondary to steroids : Adjusted the Lantus to 20 units at night, NovoLog 12 units 3 times a day 3 times a day. And will still use the correction sliding scale. Explained to the patient that she'll be needing insulin at discharge. Blood sugars are better. t #3 hypokalemia hyponatremia: Improved  #4 epistaxis resolved  #5 hypertension: controlled, tachycardic heart rate up to 1 12 bpm: Add Cardizem 60 MG every 6 hours , continue head CT Z. #6 bipolar disorder continue Lamictal Acute sinusitis continue IV antibiotics. Cough due to 1 continue Tussionex, add Tessalon Perles. All the records are reviewed and case discussed with Care Management/Social Workerr. Management plans discussed with the patient, family and they are in agreement.  CODE STATUS:full  TOTAL TIME TAKING CARE OF THIS PATIENT: 69minutes.   POSSIBLE D/C IN 1-2 DAYS, DEPENDING ON CLINICAL CONDITION.   Epifanio Lesches M.D on 03/07/2015 at 11:04 AM  Between 7am to 6pm - Pager - [phone]  After 6pm go to [email or web address] - password [email or web address] [facility]  [place] [facility]  Office  [phone]  CC: Primary care physician; No primary care provider on file.

## 2015-03-07 NOTE — Progress Notes (Signed)
Inpatient Diabetes Program Recommendations  AACE/ADA: New Consensus Statement on Inpatient Glycemic Control (2013)  Target Ranges:  Prepandial:   less than 140 mg/dL      Peak postprandial:   less than 180 mg/dL (1-2 hours)      Critically ill patients:  140 - 180 mg/dL   Results for Meghan [name], Meghan [name] (MRN [id number]) as of 03/07/2015 12:50  Ref. Range 03/05/2015 20:41 03/06/2015 07:15 03/06/2015 10:52 03/06/2015 16:31 03/06/2015 20:41 03/07/2015 07:52 03/07/2015 11:16  Glucose-Capillary Latest Ref Range: 65-99 mg/dL 202 (H) 288 (H) 432 (H) 100 (H) 286 (H) 218 (H) 374 (H)   Diabetes history: DM2 Outpatient Diabetes medications: Onglyza 5 mg QHS Current orders for Inpatient glycemic control: Lantus 20 units QHS, Novolog 0-9 units TID with meals, Novolog 12 units TID with meals for meal coverage  Inpatient Diabetes Program Recommendations Insulin - Basal: Please consider increasing Lantus to 25 unis QHS. Correction (SSI): Please consider increasing Novolog correction to moderate scale and ADDING Novolog bedtime correction scale. Insulin - Meal Coverage: If steroids are continued as ordered, please consider increasing meal coverage to Novolog 15 units TID with meals. HgbA1C: A1C 10.3% on 03/01/15 indicating poor glycemic control over the past 2-3 months.  Note: Spoke with patient about glycemic control and insulin. Discussed glucose and A1C goals and reviewed impact of steroids on glycemic control. Inquired about past insulin history and patient reports that she was on insulin in the past and used vial and syringe for insulin injections. Discussed insulin pens and patient reports that she tried insulin pens in the past but she was not able to "push down the button to give the insulin because of the weakness in my hands."  Patient states that prefers to use vial and syringe for insulin injections. Discussed Lantus and Novolog insulin and explained how they work and when they are currently being given as an  inpatient. Informed patient that she will need to follow discharge instructions to determine which insulin(s) to take, dosage, and frequency. Reviewed and demonstrated how to draw up and administer insulin with vial/syringe. Patient was able to provide successful re-demonstration of proper technique for drawing up insulin. Patient gave her self an insulin injection for lunch while I was visiting with her. Patient reports that she feels comfortable with giving herself insulin injections.  Reviewed signs and symptoms of hyperglycemia and hypoglycemia along with treatment for both. RNs to provide ongoing basic DM education at bedside with this patient and engage patient to actively check blood glucose and administer insulin injections. Ordered insulin starter kit yesterday but patient has not yet received. Discussed with Velna Hatchet, RN and she called pharmacy to request another insulin starter kit. Once she receives the insulin starter kit she will give it to the patient. Encouraged patient to review the kit and if she has questions to ask nursing staff. Patient states that she now checks her glucose at home 3 times a day. Encouraged patient to continue to check her glucose 3-4 times per day and to keep a log of glucose readings which she needs to take with her to follow up visits with her PCP. Explained to the patient that if she is placed on a steroid taper as an outpatient she may need to have her insulin dosages adjusted. Instructed patient to call her PCP if she is having any issues with hypoglycemia.  Patient verbalized understanding of information discussed and she states that she has no further questions at this time related to diabetes.   MD-  At time of discharge, please provide patient with Rx for insulin vials and insulin syringes.  Thanks, Barnie Alderman, RN, MSN, CCRN, CDE Diabetes Coordinator Inpatient Diabetes Program [phone] (Team Pager from [place] to [place]) [phone] ([place] office) [phone] [facility]  office) [phone] [facility] office)

## 2015-03-08 LAB — CULTURE, BLOOD (ROUTINE X 2)
CULTURE: NO GROWTH
CULTURE: NO GROWTH

## 2015-03-08 LAB — GLUCOSE, CAPILLARY
Glucose-Capillary: 101 mg/dL — ABNORMAL HIGH (ref 65–99)
Glucose-Capillary: 181 mg/dL — ABNORMAL HIGH (ref 65–99)
Glucose-Capillary: 361 mg/dL — ABNORMAL HIGH (ref 65–99)
Glucose-Capillary: 330 mg/dL — ABNORMAL HIGH (ref 65–99)

## 2015-03-08 LAB — VANCOMYCIN, TROUGH: Vancomycin Tr: 19 ug/mL (ref 10–20)

## 2015-03-08 MED ORDER — AZITHROMYCIN 250 MG PO TABS
500.0000 mg | ORAL_TABLET | Freq: Every day | ORAL | Status: DC
  Administered 2015-03-08 – 2015-03-10 (×3): 500 mg via ORAL
  Filled 2015-03-08 (×3): qty 2

## 2015-03-08 MED ORDER — INSULIN ASPART 100 UNIT/ML ~~LOC~~ SOLN
5.0000 [IU] | Freq: Once | SUBCUTANEOUS | Status: AC
  Administered 2015-03-08: 5 [IU] via SUBCUTANEOUS
  Filled 2015-03-08: qty 5

## 2015-03-08 MED ORDER — ACYCLOVIR 5 % EX OINT
TOPICAL_OINTMENT | CUTANEOUS | Status: DC
  Administered 2015-03-08 – 2015-03-10 (×17): via TOPICAL
  Filled 2015-03-08: qty 5
  Filled 2015-03-08: qty 30
  Filled 2015-03-08: qty 5
  Filled 2015-03-08: qty 30
  Filled 2015-03-08: qty 5

## 2015-03-08 NOTE — Care Management (Signed)
Patient continues to be in agreement with home health nursing. Anticipate discharge within the next 24 hours.  Meghan [name] is aware

## 2015-03-08 NOTE — Progress Notes (Signed)
Patient alert and oriented x4, no complaints at this time. vss at this time. Patient NSR on telemetry. Will continue to assess. Meghan [name] R [place]  

## 2015-03-08 NOTE — Progress Notes (Signed)
[place] [facility] [place] NAME: Meghan [name]    MR#:  [id number]  DATE OF BIRTH:  [birth year]  SUBJECTIVE: Complains of cough. And also has fever blisters on both lips with severe pain.   CHIEF COMPLAINT:   Chief Complaint  Patient presents with  . Hyperglycemia    REVIEW OF SYSTEMS:   Review of Systems  Constitutional: Negative for fever and chills.  HENT: Negative for hearing loss and sore throat.   Eyes: Negative for blurred vision, double vision and photophobia.  Respiratory: Positive for cough. Negative for hemoptysis.   Cardiovascular: Negative for palpitations, orthopnea and leg swelling.  Gastrointestinal: Negative for vomiting, abdominal pain and diarrhea.  Genitourinary: Negative for dysuria and urgency.  Musculoskeletal: Negative for myalgias and neck pain.  Skin: Negative for rash.  Neurological: Negative for dizziness, focal weakness, seizures, weakness and headaches.  Psychiatric/Behavioral: Negative for memory loss. The patient does not have insomnia.      DRUG ALLERGIES:   Allergies  Allergen Reactions  . Gabapentin Other (See Comments)    Pt states that it causes her BP to drop.   . Iodine Other (See Comments)    Reaction:  Syncopy  . Naproxen Hives  . Nsaids Other (See Comments)    Reaction:  Unknown     VITALS:  Blood pressure 136/77, pulse 102, temperature 97.3 F (36.3 C), temperature source Oral, resp. rate 17, height 4\' 9"  (1.448 m), weight 76.34 kg (168 lb 4.8 oz), SpO2 93 %.  PHYSICAL EXAMINATION:  GENERAL:  69 y.o.-year-old patient lying in the bed with no acute distress.  EYES: Pupils equal, round, reactive to light and accommodation. No scleral icterus. Extraocular muscles intact.  HEENT: Head atraumatic, normocephalic. Oropharynx and nasopharynx clear.  NECK:  Supple, no jugular venous distention. No thyroid enlargement, no tenderness.  LUNGS: Bilateral expiratory wheeze in all lung fields. .  No use of accessory muscles of respiration.  CARDIOVASCULAR: S1, S2 normal. No murmurs, rubs, or gallops.  ABDOMEN: Soft, nontender, nondistended. Bowel sounds present. No organomegaly or mass.  EXTREMITIES: No pedal edema, cyanosis, or clubbing.  NEUROLOGIC: Cranial nerves II through XII are intact. Muscle strength 5/5 in all extremities. Sensation intact. Gait not checked.  PSYCHIATRIC: The patient is alert and oriented x 3.  SKIN: both lips red, with herpes blisters.  LABORATORY PANEL:   CBC  Recent Labs Lab 03/04/15 0513  WBC 3.2*  HGB 10.0*  HCT 29.6*  PLT 111*   ------------------------------------------------------------------------------------------------------------------  Chemistries   Recent Labs Lab 03/01/15 2205  03/04/15 0513 03/06/15 0443  NA  --   < > 133*  --   K  --   < > 4.1  --   CL  --   < > 101  --   CO2  --   < > 24  --   GLUCOSE  --   < > 278*  --   BUN  --   < > 15  --   CREATININE  --   < > 0.58 0.60  CALCIUM  --   < > 7.5*  --   MG 2.1  --   --   --   < > = values in this interval not displayed. ------------------------------------------------------------------------------------------------------------------  Cardiac Enzymes No results for input(s): TROPONINI in the last 168 hours. ------------------------------------------------------------------------------------------------------------------  RADIOLOGY:  No results found.  EKG:   Orders placed or performed during the hospital encounter of 03/01/15  . EKG  .  EKG 12-Lead  . EKG 12-Lead    ASSESSMENT AND PLAN:  1. Acute asthma flare patient says she never smoked-she has a history of asthma so she has asthma flare rather than COPD flare.  Slowly improving continue steroids, nebulizers, and  Antibiotics. #2 hyperglycemia secondary to steroids : Glucose well controlled, blood sugar this morning 101. Decrease the NovoLog dose. I have decreased the steroids so decreasing the dose of  NovoLog as well. Possible discharge tomorrow. It is given education about insulin injections. t #3 hypokalemia hyponatremia: Improved  #4 epistaxis resolved  #5 hypertension: controlled, tachycardic continue Cardizem,;Imdur.  #6 bipolar disorder continue Lamictal Acute sinusitis continue IV antibiotics. Cough due to 1 continue Tussionex, add Tessalon Perles; added azithromycin for atypical coverage. Herpes labialis; continue  Zovirax cream,. All the records are reviewed and case discussed with Care Management/Social Workerr. Management plans discussed with the patient, family and they are in agreement.  CODE STATUS:full  TOTAL TIME TAKING CARE OF THIS PATIENT: 84minutes.   POSSIBLE D/C IN 1-2 DAYS, DEPENDING ON CLINICAL CONDITION.   Epifanio Lesches M.D on 03/08/2015 at 11:56 AM  Between 7am to 6pm - Pager - [phone]  After 6pm go to [email or web address] - password [email or web address] [facility]  [facility] [place] [facility]  Office  [phone]  CC: Primary care physician; No primary care provider on file.

## 2015-03-08 NOTE — Progress Notes (Signed)
Initial Nutrition Assessment    INTERVENTION:  Meals and Snacks: Cater to patient preferences Education: reviewed basics of diabetic, heart healthy diet; pt reports she already knows things she is going to change with regards to her diet including using Mrs. Dash instead of salt, sugar substitute instead of sugar.    NUTRITION DIAGNOSIS:   Food and nutrition related knowledge deficit related to chronic illness as evidenced by  (pt using regular sugar and salt at home).   GOAL:   Patient will meet greater than or equal to 90% of their needs   MONITOR:    (Energy Intake, Knowledge, Electrolyte/Renal profile, Digestive Syste,, Anthropometrics, Glucose Profile)  REASON FOR ASSESSMENT:   LOS    ASSESSMENT:    Pt admitted with acute asthma flare, hyperglycemia  Past Medical History  Diagnosis Date  . COPD (chronic obstructive pulmonary disease)   . Diabetes mellitus   . Hypertension   . Depression   . Anxiety   . Arthritis     RA  . GERD (gastroesophageal reflux disease)   . Osteopenia   . DDD (degenerative disc disease)   . Obese   . PTSD (post-traumatic stress disorder)      Diet Order:  Diet heart healthy/carb modified Room service appropriate?: Yes; Fluid consistency:: Thin   Energy Intake: recorded po intake 100% of meals  Food and nutrition related history: pt reports good appetite and intake at home  Electrolyte and Renal Profile:  Recent Labs Lab 03/01/15 2205 03/02/15 0427 03/04/15 0513 03/06/15 0443  BUN  --  10 15  --   CREATININE  --  0.77 0.58 0.60  NA  --  133* 133*  --   K  --  3.4* 4.1  --   MG 2.1  --   --   --    Glucose Profile:  Recent Labs  03/07/15 2029 03/08/15 0730 03/08/15 1141  GLUCAP 165* 101* 181*   Protein Profile: No results for input(s): ALBUMIN in the last 168 hours.  Meds: ss novolog, aspart, lantus, solumedrol  Height:   Ht Readings from Last 1 Encounters:  03/01/15 4\' 9"  (1.448 m)    Weight: pt reports  wt has been stable  Wt Readings from Last 1 Encounters:  03/01/15 168 lb 4.8 oz (76.34 kg)    BMI:  Body mass index is 36.41 kg/(m^2).  LOW Care Level  Kerman Passey MS, [place], LDN [phone] Pager

## 2015-03-08 NOTE — Progress Notes (Signed)
ANTIBIOTIC CONSULT NOTE - FOLLOW UP   Pharmacy Consult for Vancomycin and Zosyn Indication: sinusitis/possible sepsis   Allergies  Allergen Reactions  . Gabapentin Other (See Comments)    Pt states that it causes her BP to drop.   . Iodine Other (See Comments)    Reaction:  Syncopy  . Naproxen Hives  . Nsaids Other (See Comments)    Reaction:  Unknown     Patient Measurements: Height: 4\' 9"  (144.8 cm) Weight: 168 lb 4.8 oz (76.34 kg) IBW/kg (Calculated) : 38.6 Adjusted Body Weight: 57.8 kg  Vital Signs: Temp: 97.6 F (36.4 C) (07/29 0509) Temp Source: Oral (07/29 0509) BP: 134/67 mmHg (07/29 0935) Pulse Rate: 88 (07/29 0935) Intake/Output from previous day: 07/28 0701 - 07/29 0700 In: 780 [P.O.:480; IV Piggyback:300] Out: 3050 [Urine:3050] Intake/Output from this shift: Total I/O In: -  Out: 500 [Urine:500]  Labs:  Recent Labs  03/06/15 0443  CREATININE 0.60   Estimated Creatinine Clearance: 56.3 mL/min (by C-G formula based on Cr of 0.6).  Recent Labs  03/06/15 1120 03/08/15 1003  VANCOTROUGH 7* 17     Microbiology: Recent Results (from the past 720 hour(s))  Culture, blood (routine x 2)     Status: None   Collection Time: 03/03/15  3:56 PM  Result Value Ref Range Status   Specimen Description BLOOD RIGHT HAND  Final   Special Requests BOTTLES DRAWN AEROBIC ONLY  1CC  Final   Culture NO GROWTH 5 DAYS  Final   Report Status 03/08/2015 FINAL  Final  Culture, blood (routine x 2)     Status: None   Collection Time: 03/03/15  4:15 PM  Result Value Ref Range Status   Specimen Description BLOOD RIGHT ASSIST CONTROL  Final   Special Requests BOTTLES DRAWN AEROBIC AND ANAEROBIC  5CC  Final   Culture NO GROWTH 5 DAYS  Final   Report Status 03/08/2015 FINAL  Final    Medical History: Past Medical History  Diagnosis Date  . COPD (chronic obstructive pulmonary disease)   . Diabetes mellitus   . Hypertension   . Depression   . Anxiety   . Arthritis      RA  . GERD (gastroesophageal reflux disease)   . Osteopenia   . DDD (degenerative disc disease)   . Obese   . PTSD (post-traumatic stress disorder)     Medications:  Anti-infectives    Start     Dose/Rate Route Frequency Ordered Stop   03/06/15 2300  vancomycin (VANCOCIN) 1,250 mg in sodium chloride 0.9 % 250 mL IVPB     1,250 mg 166.7 mL/hr over 90 Minutes Intravenous Every 12 hours 03/06/15 1257     03/04/15 0600  vancomycin (VANCOCIN) IVPB 1000 mg/200 mL premix     1,000 mg 200 mL/hr over 60 Minutes Intravenous Every 18 hours 03/03/15 1932 03/06/15 1313   03/03/15 1800  vancomycin (VANCOCIN) IVPB 1000 mg/200 mL premix     1,000 mg 200 mL/hr over 60 Minutes Intravenous  Once 03/03/15 1724 03/03/15 1929   03/03/15 1730  piperacillin-tazobactam (ZOSYN) IVPB 3.375 g     3.375 g 12.5 mL/hr over 240 Minutes Intravenous 3 times per day 03/03/15 1724     03/03/15 0945  azithromycin (ZITHROMAX) 500 mg in dextrose 5 % 250 mL IVPB  Status:  Discontinued     500 mg 250 mL/hr over 60 Minutes Intravenous Every 24 hours 03/03/15 0943 03/03/15 1654   03/03/15 0815  cefTRIAXone (ROCEPHIN) 1 g in  dextrose 5 % 50 mL IVPB  Status:  Discontinued     1 g 100 mL/hr over 30 Minutes Intravenous Every 24 hours 03/03/15 0746 03/04/15 1121   03/03/15 0745  cefTRIAXone (ROCEPHIN) 1 g in dextrose 5 % 50 mL IVPB - Premix  Status:  Discontinued     1 g 100 mL/hr over 30 Minutes Intravenous Every 24 hours 03/03/15 0737 03/03/15 0743   03/01/15 1945  azithromycin (ZITHROMAX) 500 mg in dextrose 5 % 250 mL IVPB     500 mg 250 mL/hr over 60 Minutes Intravenous  Once 03/01/15 1941 03/02/15 0004     Assessment: Patient admitted for asthma exacerbation. Initially started on Ceftriaxone and Azithromycin.   Patient now spiking fevers and MD has ordered pharmacy consult for Vancomycin and Zosyn. Now with evidence of sinusitis on CT per MD note  Noted that patient is currently taking Xeljanz (toficitanib) which  can cause serious infections.   Goal of Therapy:  Vancomycin trough level 15-20 mcg/ml Resolution of symptoms  Trough 7/27 at 1100: 7 mcg/ml (dose was Vancomycin 1 gram Q18h) Trough 7/29 at 1000: 19 mcg/ml (dose now Vancomycin 1250mg  IV Q12h)  Plan: Trough=19 (drawn 1 hr prior to dose). Scr 0.6 from 7/27. Per MD note possible discharge in 1-2 days. Will continue current dose of Vancomycin 1250mg  IV Q12h. Will order Scr for am. Next trough ordered for 8/1 at 1000.  Follow for possible accumulation/change in renal fxn.   Zosyn: Continue Zosyn 3.375 g IV q8 hours.   Chinita Greenland PharmD Clinical Pharmacist 03/08/2015

## 2015-03-08 NOTE — Care Management Important Message (Signed)
Important Message  Patient Details  Name: Meghan [name] MRN: [id number] Date of Birth: 1946/01/02   Medicare Important Message Given:  Yes-second notification given    Katrina Stack, RN 03/08/2015, 9:39 AM

## 2015-03-08 NOTE — Progress Notes (Signed)
Patient BS 101, per Dr. Vianne Bulls give 5 units of novolog x1 for meal coverage. Wilnette Kales

## 2015-03-09 LAB — GLUCOSE, CAPILLARY
GLUCOSE-CAPILLARY: 75 mg/dL (ref 65–99)
GLUCOSE-CAPILLARY: 116 mg/dL — AB (ref 65–99)
Glucose-Capillary: 140 mg/dL — ABNORMAL HIGH (ref 65–99)
Glucose-Capillary: 121 mg/dL — ABNORMAL HIGH (ref 65–99)
Glucose-Capillary: 108 mg/dL — ABNORMAL HIGH (ref 65–99)

## 2015-03-09 LAB — CREATININE, SERUM
Creatinine, Ser: 0.59 mg/dL (ref 0.44–1.00)
GFR calc non Af Amer: >60 mL/min (ref >60)

## 2015-03-09 LAB — PLATELET COUNT: Platelets: 192 10*3/uL (ref 150–440)

## 2015-03-09 MED ORDER — PREDNISONE 20 MG PO TABS: ORAL_TABLET | ORAL | Status: DC

## 2015-03-09 MED ORDER — LEVALBUTEROL HCL 1.25 MG/0.5ML IN NEBU: 1.2500 mg | INHALATION_SOLUTION | Freq: Four times a day (QID) | RESPIRATORY_TRACT | Status: DC | PRN

## 2015-03-09 MED ORDER — PREDNISONE 50 MG PO TABS: 50.0000 mg | ORAL_TABLET | Freq: Every day | ORAL | Status: DC

## 2015-03-09 MED ORDER — INSULIN ASPART 100 UNIT/ML ~~LOC~~ SOLN: 0.0000 [IU] | Freq: Three times a day (TID) | SUBCUTANEOUS | Status: DC

## 2015-03-09 MED ORDER — TRAMADOL HCL 50 MG PO TABS
50.0000 mg | ORAL_TABLET | Freq: Four times a day (QID) | ORAL | Status: DC | PRN
  Administered 2015-03-09 – 2015-03-10 (×2): 50 mg via ORAL
  Filled 2015-03-09 (×3): qty 1

## 2015-03-09 MED ORDER — BENZONATATE 200 MG PO CAPS: 200.0000 mg | ORAL_CAPSULE | Freq: Three times a day (TID) | ORAL | Status: DC | PRN

## 2015-03-09 MED ORDER — DILTIAZEM HCL 30 MG PO TABS: 30.0000 mg | ORAL_TABLET | Freq: Three times a day (TID) | ORAL | Status: DC

## 2015-03-09 MED ORDER — INSULIN GLARGINE 100 UNIT/ML ~~LOC~~ SOLN: 20.0000 [IU] | Freq: Every day | SUBCUTANEOUS | Status: DC

## 2015-03-09 MED ORDER — SODIUM CHLORIDE 0.9 % IJ SOLN
3.0000 mL | Freq: Two times a day (BID) | INTRAMUSCULAR | Status: DC
  Administered 2015-03-09 – 2015-03-10 (×2): 3 mL via INTRAVENOUS

## 2015-03-09 MED ORDER — AZITHROMYCIN 250 MG PO TABS: ORAL_TABLET | ORAL | Status: DC

## 2015-03-09 MED ORDER — SODIUM CHLORIDE 0.9 % IJ SOLN: 3.0000 mL | INTRAMUSCULAR | Status: DC | PRN

## 2015-03-09 MED ORDER — ACYCLOVIR 5 % EX OINT: TOPICAL_OINTMENT | CUTANEOUS | Status: DC

## 2015-03-09 MED ORDER — INSULIN STARTER KIT- PEN NEEDLES (ENGLISH): 1.0000 | Freq: Once | Status: DC

## 2015-03-09 NOTE — Progress Notes (Signed)
[place] at [place] NAME: Gracie Gupta    MR#:  [id number]  DATE OF BIRTH:  [birth year]  SUBJECTIVE:  CHIEF COMPLAINT:   Chief Complaint  Patient presents with  . Hyperglycemia   Breathing much more easily, no oxygen via nasal cannula. Has been up and moving without difficulty. She does have a headache.  REVIEW OF SYSTEMS:   Review of Systems  Constitutional: Negative for fever.  Respiratory: Negative for shortness of breath.   Cardiovascular: Negative for chest pain and palpitations.  Gastrointestinal: Negative for nausea, vomiting and abdominal pain.  Genitourinary: Negative for dysuria.   DRUG ALLERGIES:   Allergies  Allergen Reactions  . Gabapentin Other (See Comments)    Pt states that it causes her BP to drop.   . Iodine Other (See Comments)    Reaction:  Syncopy  . Naproxen Hives  . Nsaids Other (See Comments)    Reaction:  Unknown     VITALS:  Blood pressure 99/51, pulse 78, temperature 98.9 F (37.2 C), temperature source Oral, resp. rate 18, height 4\' 9"  (1.448 m), weight 76.34 kg (168 lb 4.8 oz), SpO2 96 %.  PHYSICAL EXAMINATION:  GENERAL:  69 y.o.-year-old patient sitting up in the bed with no acute distress.  EYES: Pupils equal, round, reactive to light and accommodation. No scleral icterus. Extraocular muscles intact.  HEENT: Head atraumatic, normocephalic. Oropharynx and nasopharynx clear.  NECK:  Supple, no jugular venous distention. No thyroid enlargement, no tenderness.  LUNGS: Normal breath sounds bilaterally, no wheezing, rales, rhonchi or crepitation. No use of accessory muscles of respiration.  CARDIOVASCULAR: S1, S2 normal. No murmurs, rubs, or gallops.  ABDOMEN: Soft, nontender, nondistended. Bowel sounds present. No organomegaly or mass.  EXTREMITIES: No pedal edema, cyanosis, or clubbing.  NEUROLOGIC: Cranial nerves II through XII are intact. Muscle strength 5/5 in all extremities. Sensation  intact. Gait not checked.  PSYCHIATRIC: The patient is alert and oriented x 3.  SKIN: No obvious rash, lesion, or ulcer.    LABORATORY PANEL:   CBC  Recent Labs Lab 03/04/15 0513 03/09/15 0518  WBC 3.2*  --   HGB 10.0*  --   HCT 29.6*  --   PLT 111* 192   ------------------------------------------------------------------------------------------------------------------  Chemistries   Recent Labs Lab 03/04/15 0513  03/09/15 0518  NA 133*  --   --   K 4.1  --   --   CL 101  --   --   CO2 24  --   --   GLUCOSE 278*  --   --   BUN 15  --   --   CREATININE 0.58  < > 0.59  CALCIUM 7.5*  --   --   < > = values in this interval not displayed. ------------------------------------------------------------------------------------------------------------------  Cardiac Enzymes No results for input(s): TROPONINI in the last 168 hours. ------------------------------------------------------------------------------------------------------------------  RADIOLOGY:  No results found.  EKG:   Orders placed or performed during the hospital encounter of 03/01/15  . EKG  . EKG 12-Lead  . EKG 12-Lead    ASSESSMENT AND PLAN:   #1 asthma exacerbation: Respiratory status improving significantly. Transition from Solu-Medrol to prednisone. Continue with nebulizers and azithromycin. Continue Tussionex and Tessalon Perles for cough  #2 type 2 diabetes uncontrolled: Hemoglobin A1c is 10.3. She's been started on Lantus and sliding scale. Blood sugars have been elevated due to steroid.  #3 hypertension: Continue clonidine, Imdur. Heart exam initiated during this hospitalization. She is actually  hypotensive this morning and I will stop the Cardizem.  #4 bipolar disorder: Continue Lamictal. Stable  #5 herpes labialis: Continue Zovirax cream  #6 headache: Stop Percocet start tramadol. She describes a tension-type headache.    All the records are reviewed and case discussed with Care  Management/Social Workerr. Management plans discussed with the patient, family and they are in agreement.  CODE STATUS: Full  TOTAL TIME TAKING CARE OF THIS PATIENT: 35  minutes.  Greater than 50% of time spent in care coordination and counseling. POSSIBLE D/C IN 1 DAYS, DEPENDING ON CLINICAL CONDITION.   Myrtis Ser M.D on 03/09/2015 at 12:58 PM  Between 7am to 6pm - Pager - [phone]  After 6pm go to [email or web address] - password [email or web address] [facility]  [place] [facility]  Office  [phone]  CC: Primary care physician; No primary care provider on file.

## 2015-03-09 NOTE — Care Management Note (Signed)
Case Management Note  Patient Details  Name: ROSAMARIA DONN MRN: [id number] Date of Birth: November 22, 1945  Subjective/Objective:          Discharge to home with Home Health RN services requested from [facility] per Ms Miyamoto's request earlier this week. All discharge orders faxed to [facility] today.           Action/Plan:   Expected Discharge Date:  03/02/15               Expected Discharge Plan:     In-House Referral:     Discharge planning Services     Post Acute Care Choice:    Choice offered to:     DME Arranged:    DME Agency:     HH Arranged:    [place] Agency:     Status of Service:     Medicare Important Message Given:  Yes-second notification given Date Medicare IM Given:    Medicare IM give by:    Date Additional Medicare IM Given:    Additional Medicare Important Message give by:     If discussed at [place] of Stay Meetings, dates discussed:    Additional Comments:  Adalie Mand A, RN 03/09/2015, 11:08 AM

## 2015-03-09 NOTE — Progress Notes (Signed)
Per MD Volanda Napoleon, stop 12 U novolog AC now.

## 2015-03-10 DIAGNOSIS — J45901 Unspecified asthma with (acute) exacerbation: Secondary | ICD-10-CM

## 2015-03-10 LAB — GLUCOSE, CAPILLARY
GLUCOSE-CAPILLARY: 137 mg/dL — AB (ref 65–99)
Glucose-Capillary: 220 mg/dL — ABNORMAL HIGH (ref 65–99)
Glucose-Capillary: 367 mg/dL — ABNORMAL HIGH (ref 65–99)

## 2015-03-10 MED ORDER — INSULIN GLARGINE 100 UNIT/ML ~~LOC~~ SOLN: 15.0000 [IU] | Freq: Every day | SUBCUTANEOUS | Status: DC

## 2015-03-10 MED ORDER — PREDNISONE 20 MG PO TABS
20.0000 mg | ORAL_TABLET | Freq: Every day | ORAL | Status: DC
  Administered 2015-03-10: 20 mg via ORAL
  Filled 2015-03-10: qty 1

## 2015-03-10 MED ORDER — HYDROCHLOROTHIAZIDE 12.5 MG PO CAPS
12.5000 mg | ORAL_CAPSULE | Freq: Every day | ORAL | Status: DC
  Administered 2015-03-10: 12.5 mg via ORAL
  Filled 2015-03-10: qty 1

## 2015-03-10 MED ORDER — PREDNISONE 10 MG PO TABS: ORAL_TABLET | ORAL | Status: DC

## 2015-03-10 NOTE — Progress Notes (Signed)
   03/10/15 1400  Clinical Encounter Type  Visited With Patient  Visit Type Spiritual support  Spiritual Encounters  Spiritual Needs Literature  Stress Factors  Patient Stress Factors Health changes  Advance Directives (For Healthcare)  Does patient have an advance directive? Yes  Would patient like information on creating an advanced directive? Yes - Scientist, clinical (histocompatibility and immunogenetics) given  Type of Paramedic of [place];Living will  Does patient want to make changes to advanced directive? Yes - information given  Copy of advanced directive(s) in chart? Yes   Advance Directive education provided and completed. Miss Mariann Laster notarized. Patient has a copy.  Chaplains and pastoral care can be reached via pager [phone] or online

## 2015-03-10 NOTE — Progress Notes (Signed)
Patient d/c'd home. Diabetes education provided, no questions at this time. Patient to be picked up by grandson. Telemetry removed. Wilnette Kales

## 2015-03-10 NOTE — Care Management Note (Signed)
Case Management Note  Patient Details  Name: Meghan [name] MRN: [id number] Date of Birth: Nov 13, 1945  Subjective/Objective:           Referral was made to [facility] on Saturday 03/09/15 requesting RN services.. Phone message left with Sherian Rein at [place]  notifying her that Mrs Fair is being discharged home today on 03/10/15.        Action/Plan:   Expected Discharge Date:  03/02/15               Expected Discharge Plan:     In-House Referral:     Discharge planning Services     Post Acute Care Choice:    Choice offered to:     DME Arranged:    DME Agency:     HH Arranged:    [place] Agency:     Status of Service:     Medicare Important Message Given:  Yes-second notification given Date Medicare IM Given:    Medicare IM give by:    Date Additional Medicare IM Given:    Additional Medicare Important Message give by:     If discussed at [place] of Stay Meetings, dates discussed:    Additional Comments:  Shritha Bresee A, RN 03/10/2015, 11:22 AM

## 2015-03-10 NOTE — Progress Notes (Signed)
Physical Therapy Evaluation Patient Details Name: Meghan [name] MRN: [id number] DOB: 1946-04-27 Today's Date: 03/10/2015   History of Present Illness  Patient is a 69 y.o. female admitted for nose bleed and glucose 355 on 22 July.  Clinical Impression  Patient demonstrated baseline level of function on evaluation. Because patient demonstrated decreased gait speed with lack of arm swing/trunk rotation and with pronounced torso waddling, PT inquired about use of assistive devices. Patient did admit that she has noticed she sometimes feels off balance when walking at home and admits to having difficulty maneuvering RW in home. Patient may benefit from [facility] to improve steadiness and decrease risk of falling with or without injury in future.    Follow Up Recommendations Home health PT    Equipment Recommendations  Cane    Recommendations for Other Services       Precautions / Restrictions Restrictions Weight Bearing Restrictions: No      Mobility  Bed Mobility Overal bed mobility: Independent                Transfers Overall transfer level: Independent                  Ambulation/Gait Ambulation/Gait assistance: Supervision Ambulation Distance (Feet): 190 Feet   Gait Pattern/deviations: Shuffle     General Gait Details: Patient ambulates at decreased cadence. Demonstrates zero arm swing with waddling of torso.   Stairs            Wheelchair Mobility    Modified Rankin (Stroke Patients Only)       Balance Overall balance assessment: Independent                                           Pertinent Vitals/Pain Pain Assessment: No/denies pain    Home Living Family/patient expects to be discharged to:: Private residence Living Arrangements: Other relatives (58 y.o. grandson, works at [employer]) Available Help at Discharge: Family;Neighbor;Available PRN/intermittently Type of Home: House Home Access: Stairs to enter Entrance  Stairs-Rails: Can reach both Entrance Stairs-Number of Steps: 2 Home Layout: One level Home Equipment: Walker - 4 wheels      Prior Function Level of Independence: Independent               Hand Dominance        Extremity/Trunk Assessment   Upper Extremity Assessment: Overall WFL for tasks assessed           Lower Extremity Assessment: Overall WFL for tasks assessed         Communication   Communication: No difficulties  Cognition Arousal/Alertness: Awake/alert Behavior During Therapy: WFL for tasks assessed/performed Overall Cognitive Status: Within Functional Limits for tasks assessed                      General Comments      Exercises        Assessment/Plan    PT Assessment Patent does not need any further PT services  PT Diagnosis Difficulty walking   PT Problem List    PT Treatment Interventions     PT Goals (Current goals can be found in the Care Plan section) Acute Rehab PT Goals Patient Stated Goal: "To go home" PT Goal Formulation: With patient Time For Goal Achievement: 03/24/15 Potential to Achieve Goals: Good    Frequency     Barriers to discharge  Co-evaluation               End of Session Equipment Utilized During Treatment: Gait belt Activity Tolerance: Patient tolerated treatment well Patient left: in bed;with call bell/phone within reach Nurse Communication: Mobility status         Time: 1312-1330 PT Time Calculation (min) (ACUTE ONLY): 18 min   Charges:   PT Evaluation $Initial PT Evaluation Tier I: 1 Procedure     PT G Codes:        Dorice Lamas, PT, DPT 03/10/2015, 1:36 PM

## 2015-03-10 NOTE — Discharge Instructions (Signed)
Follow-up with your primary care provider within the week. Please check blood sugars 3 times a day and at bedtime and bring readings to your primary care office this week.   DIET:  Diabetic diet  DISCHARGE CONDITION:  Fair  ACTIVITY:  Activity as tolerated  OXYGEN:  Home Oxygen: No.   Oxygen Delivery: room air  DISCHARGE LOCATION:  home   If you experience worsening of your admission symptoms, develop shortness of breath, life threatening emergency, suicidal or homicidal thoughts you must seek medical attention immediately by calling 911 or calling your MD immediately  if symptoms less severe.  You Must read complete instructions/literature along with all the possible adverse reactions/side effects for all the Medicines you take and that have been prescribed to you. Take any new Medicines after you have completely understood and accpet all the possible adverse reactions/side effects.   Please note  You were cared for by a hospitalist during your hospital stay. If you have any questions about your discharge medications or the care you received while you were in the hospital after you are discharged, you can call the unit and asked to speak with the hospitalist on call if the hospitalist that took care of you is not available. Once you are discharged, your primary care physician will handle any further medical issues. Please note that NO REFILLS for any discharge medications will be authorized once you are discharged, as it is imperative that you return to your primary care physician (or establish a relationship with a primary care physician if you do not have one) for your aftercare needs so that they can reassess your need for medications and monitor your lab values.

## 2015-03-10 NOTE — Discharge Summary (Signed)
Meghan [name] at [facility] NAME: Meghan [name]    MR#:  [id number]  DATE OF BIRTH:  1946-06-02  DATE OF ADMISSION:  03/01/2015 ADMITTING PHYSICIAN: Demetrios Loll, MD  DATE OF DISCHARGE: 03/10/15  PRIMARY CARE PHYSICIAN: No primary care provider on file.    ADMISSION DIAGNOSIS:  Hyperglycemia [R73.9] COPD exacerbation [J44.1]  DISCHARGE DIAGNOSIS:  Principal Problem:   Asthma with acute exacerbation Active Problems:   Diabetes mellitus, type II   HTN (hypertension)   Hypokalemia   Hyponatremia   SECONDARY DIAGNOSIS:   Past Medical History  Diagnosis Date  . COPD (chronic obstructive pulmonary disease)   . Diabetes mellitus   . Hypertension   . Depression   . Anxiety   . Arthritis     RA  . GERD (gastroesophageal reflux disease)   . Osteopenia   . DDD (degenerative disc disease)   . Obese   . PTSD (post-traumatic stress disorder)     HOSPITAL COURSE:   #1 asthma exacerbation: Her initial presentation complaint was nosebleed, but on evaluation it was noted that she was short of breath and wheezing. Chest x-ray clear for any infiltrate or edema. She was treated with standard protocol for asthma exacerbation including DuoNeb treatments, magnesium, steroid, azithromycin. At the time of discharge she is breathing comfortably on room air with no further wheezing. She has no history of smoking.  #2 type 2 diabetes uncontrolled: Hemoglobin A1c is 10.3. Blood sugars were even further elevated during the admission due to steroid for asthma exacerbation. We have decreased her insulin during the hospitalization as we have been decreasing her steroid doses. At the time of discharge she will be on 15 units of Lantus only. I have stopped her oral hypoglycemic agents. She is to check her blood sugars 3 times a day and follow-up with her primary care physician within the next week.   #3 hypertension: Continue clonidine, Imdur.  She was on Cardizem briefly during the admission for tachycardia. I have stopped this medication due to hypotension.  #4 bipolar disorder: Continue Lamictal. Stable  #5 herpes labialis: Continue Zovirax cream.  #6 headache: She describes a tension-type headache. Treated and controlled with Tylenol and tramadol while inpatient. No headache the day of discharge.  #7 epistaxis: She has had several nosebleeds during the admission. I have advised her to use over-the-counter Afrin to control bleeding.  #8 tachycardia: I suspect this is due to asthma exacerbation as well as albuterol. At the time of discharge her heart rate is in the low 100s, 105. I expect her heart rate to decrease as her steroid dose decreases and she is on less frequent beta agonists. She will follow up with her primary care provider this week for reassessment.  #8 dizziness: She will be evaluated by physical therapy prior to discharge to assess for any additional needs. DISCHARGE CONDITIONS:   Fair  CONSULTS OBTAINED:     None  DRUG ALLERGIES:   Allergies  Allergen Reactions  . Gabapentin Other (See Comments)    Pt states that it causes her BP to drop.   . Iodine Other (See Comments)    Reaction:  Syncopy  . Naproxen Hives  . Nsaids Other (See Comments)    Reaction:  Unknown     DISCHARGE MEDICATIONS:   Current Discharge Medication List    START taking these medications   Details  acyclovir ointment (ZOVIRAX) 5 % Apply topically every 3 (three) hours. Use  as needed for blistering. Qty: 5 g, Refills: 0    azithromycin (ZITHROMAX) 250 MG tablet Take one tablet per day. Qty: 4 each, Refills: 0    benzonatate (TESSALON) 200 MG capsule Take 1 capsule (200 mg total) by mouth 3 (three) times daily as needed for cough. Qty: 20 capsule, Refills: 0    insulin glargine (LANTUS) 100 UNIT/ML injection Inject 0.15 mLs (15 Units total) into the skin at bedtime. Qty: 10 mL, Refills: 11    insulin starter kit- pen  needles MISC 1 kit by Other route once. Qty: 1 kit, Refills: 0    !! predniSONE (DELTASONE) 10 MG tablet Take 2 tablets for 3 days, then one tablet for 3 days and then go back to 5 mg at bedtime. Qty: 10 tablet, Refills: 0     !! - Potential duplicate medications found. Please discuss with provider.    CONTINUE these medications which have NOT CHANGED   Details  albuterol (PROVENTIL HFA;VENTOLIN HFA) 108 (90 BASE) MCG/ACT inhaler Inhale 2 puffs into the lungs every 6 (six) hours as needed for wheezing or shortness of breath.     alendronate (FOSAMAX) 70 MG tablet Take 70 mg by mouth once a week. Pt takes on Monday.    azelastine (ASTELIN) 0.1 % nasal spray Place 1 spray into both nostrils 2 (two) times daily as needed for rhinitis.     budesonide-formoterol (SYMBICORT) 80-4.5 MCG/ACT inhaler Inhale 2 puffs into the lungs 2 (two) times daily.    Cholecalciferol (VITAMIN D) 2000 UNITS CAPS Take 1 capsule by mouth daily.    cloNIDine (CATAPRES) 0.1 MG tablet Take 0.1 mg by mouth 2 (two) times daily.    diphenhydrAMINE (BENADRYL) 25 MG tablet Take 25 mg by mouth every 6 (six) hours as needed for allergies.    escitalopram (LEXAPRO) 20 MG tablet Take 1 tablet (20 mg total) by mouth every morning. Qty: 30 tablet, Refills: 1    esomeprazole (NEXIUM) 40 MG capsule Take 40 mg by mouth at bedtime.    hydrochlorothiazide (MICROZIDE) 12.5 MG capsule Take 12.5-25 mg by mouth daily as needed (for edema).     isosorbide mononitrate (IMDUR) 30 MG 24 hr tablet Take 30 mg by mouth daily.    ketoconazole (NIZORAL) 2 % cream Apply 1 application topically at bedtime.    lamoTRIgine (LAMICTAL) 100 MG tablet Take 1 tablet (100 mg total) by mouth daily. Qty: 30 tablet, Refills: 1    levocetirizine (XYZAL) 5 MG tablet Take 5 mg by mouth daily.     meloxicam (MOBIC) 7.5 MG tablet Take 7.5 mg by mouth daily as needed for pain.     nortriptyline (PAMELOR) 10 MG capsule Take 30 mg by mouth at bedtime.     ondansetron (ZOFRAN-ODT) 8 MG disintegrating tablet Take 8 mg by mouth 3 (three) times daily as needed for nausea or vomiting.    !! predniSONE (DELTASONE) 5 MG tablet Take 5 mg by mouth at bedtime.     pregabalin (LYRICA) 50 MG capsule Take 50 mg by mouth 2 (two) times daily.    saxagliptin HCl (ONGLYZA) 5 MG TABS tablet Take 5 mg by mouth at bedtime.    Tofacitinib Citrate 5 MG TABS Take 1 tablet by mouth 2 (two) times daily.     zolpidem (AMBIEN) 10 MG tablet Take 10 mg by mouth at bedtime as needed for sleep.     !! - Potential duplicate medications found. Please discuss with provider.    STOP taking these  medications     amoxicillin-clavulanate (AUGMENTIN) 875-125 MG per tablet          DISCHARGE INSTRUCTIONS:   Follow-up with your primary care provider within the week. Please check blood sugars 3 times a day and at bedtime and bring readings to your primary care office this week.   DIET:  Diabetic diet  DISCHARGE CONDITION:  Fair  ACTIVITY:  Activity as tolerated  OXYGEN:  Home Oxygen: No.   Oxygen Delivery: room air  DISCHARGE LOCATION:  home   If you experience worsening of your admission symptoms, develop shortness of breath, life threatening emergency, suicidal or homicidal thoughts you must seek medical attention immediately by calling 911 or calling your MD immediately  if symptoms less severe.  You Must read complete instructions/literature along with all the possible adverse reactions/side effects for all the Medicines you take and that have been prescribed to you. Take any new Medicines after you have completely understood and accpet all the possible adverse reactions/side effects.   Please note  You were cared for by a hospitalist during your hospital stay. If you have any questions about your discharge medications or the care you received while you were in the hospital after you are discharged, you can call the unit and asked to speak with the  hospitalist on call if the hospitalist that took care of you is not available. Once you are discharged, your primary care physician will handle any further medical issues. Please note that NO REFILLS for any discharge medications will be authorized once you are discharged, as it is imperative that you return to your primary care physician (or establish a relationship with a primary care physician if you do not have one) for your aftercare needs so that they can reassess your need for medications and monitor your lab values.   Today   CHIEF COMPLAINT:   Chief Complaint  Patient presents with  . Hyperglycemia    HISTORY OF PRESENT ILLNESS:   Meghan [name] is a 69 y.o. female with a known history of hypertension, diabetes and COPD. the patient came to the ED due to nosebleeding today.Bleeding stopped but the patient was noticed to have a high blood sugar is 355. She said that she has been having coughing for the past 2 days. She had a one-day episode of shortness of breath and wheezing, which resolved with nebulizer. In ED, the patient is started to have shortness of breath and wheezing. She was treated with DuoNeb 3 times, magnesium and IV Solu-Medrol with minimal improvement. She also has tachycardia at 140s. Chest x-ray didn't show any infiltrate or pulmonary edema.  VITAL SIGNS:  Blood pressure 133/77, pulse 106, temperature 98.4 F (36.9 C), temperature source Oral, resp. rate 16, height _0  (1.448 m), weight 76.34 kg (168 lb 4.8 oz), SpO2 95 %.  I/O:    Intake/Output Summary (Last 24 hours) at 03/10/15 1109 Last data filed at 03/10/15 0900  Gross per 24 hour  Intake    360 ml  Output   1050 ml  Net   -690 ml    PHYSICAL EXAMINATION:  GENERAL:  69 y.o.-year-old patient lying in the bed with no acute distress.  EYES: Pupils equal, round, reactive to light and accommodation. No scleral icterus. Extraocular muscles intact.  HEENT: Head atraumatic, normocephalic. Oropharynx and  nasopharynx clear.  NECK:  Supple, no jugular venous distention. No thyroid enlargement, no tenderness.  LUNGS: Normal breath sounds bilaterally, no wheezing, rales,rhonchi or crepitation. No use of  accessory muscles of respiration.  CARDIOVASCULAR: S1, S2 normal. No murmurs, rubs, or gallops.  ABDOMEN: Soft, non-tender, non-distended. Bowel sounds present. No organomegaly or mass.  EXTREMITIES: No pedal edema, cyanosis, or clubbing.  NEUROLOGIC: Cranial nerves II through XII are intact. Muscle strength 5/5 in all extremities. Sensation intact. Gait not checked.  PSYCHIATRIC: The patient is alert and oriented x 3.  SKIN: No obvious rash, lesion, or ulcer.   DATA REVIEW:   CBC  Recent Labs Lab 03/04/15 0513 03/09/15 0518  WBC 3.2*  --   HGB 10.0*  --   HCT 29.6*  --   PLT 111* 192    Chemistries   Recent Labs Lab 03/04/15 0513  03/09/15 0518  NA 133*  --   --   K 4.1  --   --   CL 101  --   --   CO2 24  --   --   GLUCOSE 278*  --   --   BUN 15  --   --   CREATININE 0.58  < > 0.59  CALCIUM 7.5*  --   --   < > = values in this interval not displayed.  Cardiac Enzymes No results for input(s): TROPONINI in the last 168 hours.  Microbiology Results  Results for orders placed or performed during the hospital encounter of 03/01/15  Culture, blood (routine x 2)     Status: None   Collection Time: 03/03/15  3:56 PM  Result Value Ref Range Status   Specimen Description BLOOD RIGHT HAND  Final   Special Requests BOTTLES DRAWN AEROBIC ONLY  1CC  Final   Culture NO GROWTH 5 DAYS  Final   Report Status 03/08/2015 FINAL  Final  Culture, blood (routine x 2)     Status: None   Collection Time: 03/03/15  4:15 PM  Result Value Ref Range Status   Specimen Description BLOOD RIGHT ASSIST CONTROL  Final   Special Requests BOTTLES DRAWN AEROBIC AND ANAEROBIC  5CC  Final   Culture NO GROWTH 5 DAYS  Final   Report Status 03/08/2015 FINAL  Final    RADIOLOGY:  No results  found.  EKG:   Orders placed or performed during the hospital encounter of 03/01/15  . EKG  . EKG 12-Lead  . EKG 12-Lead      Management plans discussed with the patient, family and they are in agreement.  CODE STATUS:     Code Status Orders        Start     Ordered   03/01/15 2112  Full code   Continuous     03/01/15 2112      TOTAL TIME TAKING CARE OF THIS PATIENT: 40 minutes.  Greater than 50% of time spent in care coordination and counseling.  Myrtis Ser M.D on 03/10/2015 at 11:09 AM  Between 7am to 6pm - Pager - [phone]  After 6pm go to [email or web address] - password [email or web address] [facility]  [facility]  Office  [phone]  CC: Primary care physician; No primary care provider on file.

## 2015-03-11 MED ORDER — SYRINGE (DISPOSABLE) 1 ML MISC
300.0000 | Freq: Every day | Status: DC
Start: 2015-03-11 — End: 2017-03-24

## 2015-03-12 DIAGNOSIS — Z6833 Body mass index (BMI) 33.0-33.9, adult: Secondary | ICD-10-CM | POA: Diagnosis not present

## 2015-03-12 DIAGNOSIS — J449 Chronic obstructive pulmonary disease, unspecified: Secondary | ICD-10-CM | POA: Diagnosis not present

## 2015-03-12 DIAGNOSIS — Z7952 Long term (current) use of systemic steroids: Secondary | ICD-10-CM | POA: Diagnosis not present

## 2015-03-12 DIAGNOSIS — I10 Essential (primary) hypertension: Secondary | ICD-10-CM | POA: Diagnosis not present

## 2015-03-12 DIAGNOSIS — F4310 Post-traumatic stress disorder, unspecified: Secondary | ICD-10-CM | POA: Diagnosis not present

## 2015-03-12 DIAGNOSIS — Z9181 History of falling: Secondary | ICD-10-CM | POA: Diagnosis not present

## 2015-03-12 DIAGNOSIS — F419 Anxiety disorder, unspecified: Secondary | ICD-10-CM | POA: Diagnosis not present

## 2015-03-12 DIAGNOSIS — Z794 Long term (current) use of insulin: Secondary | ICD-10-CM | POA: Diagnosis not present

## 2015-03-12 DIAGNOSIS — M069 Rheumatoid arthritis, unspecified: Secondary | ICD-10-CM | POA: Diagnosis not present

## 2015-03-12 DIAGNOSIS — F329 Major depressive disorder, single episode, unspecified: Secondary | ICD-10-CM | POA: Diagnosis not present

## 2015-03-12 DIAGNOSIS — M5136 Other intervertebral disc degeneration, lumbar region: Secondary | ICD-10-CM | POA: Diagnosis not present

## 2015-03-12 DIAGNOSIS — E1165 Type 2 diabetes mellitus with hyperglycemia: Secondary | ICD-10-CM | POA: Diagnosis not present

## 2015-03-12 DIAGNOSIS — E669 Obesity, unspecified: Secondary | ICD-10-CM | POA: Diagnosis not present

## 2015-03-13 DIAGNOSIS — E1165 Type 2 diabetes mellitus with hyperglycemia: Secondary | ICD-10-CM | POA: Diagnosis not present

## 2015-03-13 DIAGNOSIS — J449 Chronic obstructive pulmonary disease, unspecified: Secondary | ICD-10-CM | POA: Diagnosis not present

## 2015-03-13 DIAGNOSIS — I10 Essential (primary) hypertension: Secondary | ICD-10-CM | POA: Diagnosis not present

## 2015-03-13 DIAGNOSIS — M064 Inflammatory polyarthropathy: Secondary | ICD-10-CM | POA: Diagnosis not present

## 2015-03-13 DIAGNOSIS — R5383 Other fatigue: Secondary | ICD-10-CM | POA: Diagnosis not present

## 2015-03-14 DIAGNOSIS — M5136 Other intervertebral disc degeneration, lumbar region: Secondary | ICD-10-CM | POA: Diagnosis not present

## 2015-03-14 DIAGNOSIS — F4310 Post-traumatic stress disorder, unspecified: Secondary | ICD-10-CM | POA: Diagnosis not present

## 2015-03-14 DIAGNOSIS — F419 Anxiety disorder, unspecified: Secondary | ICD-10-CM | POA: Diagnosis not present

## 2015-03-14 DIAGNOSIS — F329 Major depressive disorder, single episode, unspecified: Secondary | ICD-10-CM | POA: Diagnosis not present

## 2015-03-14 DIAGNOSIS — E1165 Type 2 diabetes mellitus with hyperglycemia: Secondary | ICD-10-CM | POA: Diagnosis not present

## 2015-03-14 DIAGNOSIS — Z9181 History of falling: Secondary | ICD-10-CM | POA: Diagnosis not present

## 2015-03-14 DIAGNOSIS — Z7952 Long term (current) use of systemic steroids: Secondary | ICD-10-CM | POA: Diagnosis not present

## 2015-03-14 DIAGNOSIS — I10 Essential (primary) hypertension: Secondary | ICD-10-CM | POA: Diagnosis not present

## 2015-03-14 DIAGNOSIS — Z6833 Body mass index (BMI) 33.0-33.9, adult: Secondary | ICD-10-CM | POA: Diagnosis not present

## 2015-03-14 DIAGNOSIS — Z794 Long term (current) use of insulin: Secondary | ICD-10-CM | POA: Diagnosis not present

## 2015-03-14 DIAGNOSIS — E669 Obesity, unspecified: Secondary | ICD-10-CM | POA: Diagnosis not present

## 2015-03-14 DIAGNOSIS — M069 Rheumatoid arthritis, unspecified: Secondary | ICD-10-CM | POA: Diagnosis not present

## 2015-03-14 DIAGNOSIS — J449 Chronic obstructive pulmonary disease, unspecified: Secondary | ICD-10-CM | POA: Diagnosis not present

## 2015-03-18 ENCOUNTER — Other Ambulatory Visit: Payer: Self-pay

## 2015-03-18 NOTE — Telephone Encounter (Signed)
received request for refill on alprazolam .25 mg  pt last seen on 01-31-15 next appt  04-02-15

## 2015-03-19 DIAGNOSIS — R7989 Other specified abnormal findings of blood chemistry: Secondary | ICD-10-CM | POA: Diagnosis not present

## 2015-03-19 NOTE — Telephone Encounter (Signed)
called pharmacy let them know that rx was d/c on 01-31-15.

## 2015-03-19 NOTE — Telephone Encounter (Signed)
Medication was d/c at her last appt on 6/23.

## 2015-03-20 DIAGNOSIS — E1165 Type 2 diabetes mellitus with hyperglycemia: Secondary | ICD-10-CM | POA: Diagnosis not present

## 2015-03-20 DIAGNOSIS — F4310 Post-traumatic stress disorder, unspecified: Secondary | ICD-10-CM | POA: Diagnosis not present

## 2015-03-20 DIAGNOSIS — F329 Major depressive disorder, single episode, unspecified: Secondary | ICD-10-CM | POA: Diagnosis not present

## 2015-03-20 DIAGNOSIS — F419 Anxiety disorder, unspecified: Secondary | ICD-10-CM | POA: Diagnosis not present

## 2015-03-20 DIAGNOSIS — M5136 Other intervertebral disc degeneration, lumbar region: Secondary | ICD-10-CM | POA: Diagnosis not present

## 2015-03-20 DIAGNOSIS — M069 Rheumatoid arthritis, unspecified: Secondary | ICD-10-CM | POA: Diagnosis not present

## 2015-03-20 DIAGNOSIS — J449 Chronic obstructive pulmonary disease, unspecified: Secondary | ICD-10-CM | POA: Diagnosis not present

## 2015-03-20 DIAGNOSIS — E669 Obesity, unspecified: Secondary | ICD-10-CM | POA: Diagnosis not present

## 2015-03-20 DIAGNOSIS — Z6833 Body mass index (BMI) 33.0-33.9, adult: Secondary | ICD-10-CM | POA: Diagnosis not present

## 2015-03-20 DIAGNOSIS — I10 Essential (primary) hypertension: Secondary | ICD-10-CM | POA: Diagnosis not present

## 2015-03-20 DIAGNOSIS — Z9181 History of falling: Secondary | ICD-10-CM | POA: Diagnosis not present

## 2015-03-20 DIAGNOSIS — Z7952 Long term (current) use of systemic steroids: Secondary | ICD-10-CM | POA: Diagnosis not present

## 2015-03-20 DIAGNOSIS — Z794 Long term (current) use of insulin: Secondary | ICD-10-CM | POA: Diagnosis not present

## 2015-03-22 DIAGNOSIS — M069 Rheumatoid arthritis, unspecified: Secondary | ICD-10-CM | POA: Diagnosis not present

## 2015-03-22 DIAGNOSIS — E1165 Type 2 diabetes mellitus with hyperglycemia: Secondary | ICD-10-CM | POA: Diagnosis not present

## 2015-03-22 DIAGNOSIS — Z9181 History of falling: Secondary | ICD-10-CM | POA: Diagnosis not present

## 2015-03-22 DIAGNOSIS — M5136 Other intervertebral disc degeneration, lumbar region: Secondary | ICD-10-CM | POA: Diagnosis not present

## 2015-03-22 DIAGNOSIS — F4310 Post-traumatic stress disorder, unspecified: Secondary | ICD-10-CM | POA: Diagnosis not present

## 2015-03-22 DIAGNOSIS — F329 Major depressive disorder, single episode, unspecified: Secondary | ICD-10-CM | POA: Diagnosis not present

## 2015-03-22 DIAGNOSIS — F419 Anxiety disorder, unspecified: Secondary | ICD-10-CM | POA: Diagnosis not present

## 2015-03-22 DIAGNOSIS — Z6833 Body mass index (BMI) 33.0-33.9, adult: Secondary | ICD-10-CM | POA: Diagnosis not present

## 2015-03-22 DIAGNOSIS — J449 Chronic obstructive pulmonary disease, unspecified: Secondary | ICD-10-CM | POA: Diagnosis not present

## 2015-03-22 DIAGNOSIS — E669 Obesity, unspecified: Secondary | ICD-10-CM | POA: Diagnosis not present

## 2015-03-22 DIAGNOSIS — Z794 Long term (current) use of insulin: Secondary | ICD-10-CM | POA: Diagnosis not present

## 2015-03-22 DIAGNOSIS — Z7952 Long term (current) use of systemic steroids: Secondary | ICD-10-CM | POA: Diagnosis not present

## 2015-03-22 DIAGNOSIS — I10 Essential (primary) hypertension: Secondary | ICD-10-CM | POA: Diagnosis not present

## 2015-03-25 DIAGNOSIS — F419 Anxiety disorder, unspecified: Secondary | ICD-10-CM | POA: Diagnosis not present

## 2015-03-25 DIAGNOSIS — M069 Rheumatoid arthritis, unspecified: Secondary | ICD-10-CM | POA: Diagnosis not present

## 2015-03-25 DIAGNOSIS — Z9181 History of falling: Secondary | ICD-10-CM | POA: Diagnosis not present

## 2015-03-25 DIAGNOSIS — M5136 Other intervertebral disc degeneration, lumbar region: Secondary | ICD-10-CM | POA: Diagnosis not present

## 2015-03-25 DIAGNOSIS — Z7952 Long term (current) use of systemic steroids: Secondary | ICD-10-CM | POA: Diagnosis not present

## 2015-03-25 DIAGNOSIS — E1165 Type 2 diabetes mellitus with hyperglycemia: Secondary | ICD-10-CM | POA: Diagnosis not present

## 2015-03-25 DIAGNOSIS — J449 Chronic obstructive pulmonary disease, unspecified: Secondary | ICD-10-CM | POA: Diagnosis not present

## 2015-03-25 DIAGNOSIS — F329 Major depressive disorder, single episode, unspecified: Secondary | ICD-10-CM | POA: Diagnosis not present

## 2015-03-25 DIAGNOSIS — E669 Obesity, unspecified: Secondary | ICD-10-CM | POA: Diagnosis not present

## 2015-03-25 DIAGNOSIS — Z6833 Body mass index (BMI) 33.0-33.9, adult: Secondary | ICD-10-CM | POA: Diagnosis not present

## 2015-03-25 DIAGNOSIS — Z794 Long term (current) use of insulin: Secondary | ICD-10-CM | POA: Diagnosis not present

## 2015-03-25 DIAGNOSIS — F4310 Post-traumatic stress disorder, unspecified: Secondary | ICD-10-CM | POA: Diagnosis not present

## 2015-03-25 DIAGNOSIS — I10 Essential (primary) hypertension: Secondary | ICD-10-CM | POA: Diagnosis not present

## 2015-03-26 DIAGNOSIS — M069 Rheumatoid arthritis, unspecified: Secondary | ICD-10-CM | POA: Diagnosis not present

## 2015-03-26 DIAGNOSIS — E1165 Type 2 diabetes mellitus with hyperglycemia: Secondary | ICD-10-CM | POA: Diagnosis not present

## 2015-03-26 DIAGNOSIS — I10 Essential (primary) hypertension: Secondary | ICD-10-CM | POA: Diagnosis not present

## 2015-03-26 DIAGNOSIS — F4310 Post-traumatic stress disorder, unspecified: Secondary | ICD-10-CM | POA: Diagnosis not present

## 2015-03-26 DIAGNOSIS — J449 Chronic obstructive pulmonary disease, unspecified: Secondary | ICD-10-CM | POA: Diagnosis not present

## 2015-03-29 DIAGNOSIS — E1165 Type 2 diabetes mellitus with hyperglycemia: Secondary | ICD-10-CM | POA: Diagnosis not present

## 2015-03-29 DIAGNOSIS — Z7952 Long term (current) use of systemic steroids: Secondary | ICD-10-CM | POA: Diagnosis not present

## 2015-03-29 DIAGNOSIS — F419 Anxiety disorder, unspecified: Secondary | ICD-10-CM | POA: Diagnosis not present

## 2015-03-29 DIAGNOSIS — E669 Obesity, unspecified: Secondary | ICD-10-CM | POA: Diagnosis not present

## 2015-03-29 DIAGNOSIS — J449 Chronic obstructive pulmonary disease, unspecified: Secondary | ICD-10-CM | POA: Diagnosis not present

## 2015-03-29 DIAGNOSIS — Z9181 History of falling: Secondary | ICD-10-CM | POA: Diagnosis not present

## 2015-03-29 DIAGNOSIS — Z6833 Body mass index (BMI) 33.0-33.9, adult: Secondary | ICD-10-CM | POA: Diagnosis not present

## 2015-03-29 DIAGNOSIS — F329 Major depressive disorder, single episode, unspecified: Secondary | ICD-10-CM | POA: Diagnosis not present

## 2015-03-29 DIAGNOSIS — Z794 Long term (current) use of insulin: Secondary | ICD-10-CM | POA: Diagnosis not present

## 2015-03-29 DIAGNOSIS — F4310 Post-traumatic stress disorder, unspecified: Secondary | ICD-10-CM | POA: Diagnosis not present

## 2015-03-29 DIAGNOSIS — M069 Rheumatoid arthritis, unspecified: Secondary | ICD-10-CM | POA: Diagnosis not present

## 2015-03-29 DIAGNOSIS — I10 Essential (primary) hypertension: Secondary | ICD-10-CM | POA: Diagnosis not present

## 2015-03-29 DIAGNOSIS — M5136 Other intervertebral disc degeneration, lumbar region: Secondary | ICD-10-CM | POA: Diagnosis not present

## 2015-04-01 DIAGNOSIS — Z7952 Long term (current) use of systemic steroids: Secondary | ICD-10-CM | POA: Diagnosis not present

## 2015-04-01 DIAGNOSIS — Z794 Long term (current) use of insulin: Secondary | ICD-10-CM | POA: Diagnosis not present

## 2015-04-01 DIAGNOSIS — F329 Major depressive disorder, single episode, unspecified: Secondary | ICD-10-CM | POA: Diagnosis not present

## 2015-04-01 DIAGNOSIS — M5136 Other intervertebral disc degeneration, lumbar region: Secondary | ICD-10-CM | POA: Diagnosis not present

## 2015-04-01 DIAGNOSIS — F419 Anxiety disorder, unspecified: Secondary | ICD-10-CM | POA: Diagnosis not present

## 2015-04-01 DIAGNOSIS — I10 Essential (primary) hypertension: Secondary | ICD-10-CM | POA: Diagnosis not present

## 2015-04-01 DIAGNOSIS — Z6833 Body mass index (BMI) 33.0-33.9, adult: Secondary | ICD-10-CM | POA: Diagnosis not present

## 2015-04-01 DIAGNOSIS — F4310 Post-traumatic stress disorder, unspecified: Secondary | ICD-10-CM | POA: Diagnosis not present

## 2015-04-01 DIAGNOSIS — E669 Obesity, unspecified: Secondary | ICD-10-CM | POA: Diagnosis not present

## 2015-04-01 DIAGNOSIS — E1165 Type 2 diabetes mellitus with hyperglycemia: Secondary | ICD-10-CM | POA: Diagnosis not present

## 2015-04-01 DIAGNOSIS — J449 Chronic obstructive pulmonary disease, unspecified: Secondary | ICD-10-CM | POA: Diagnosis not present

## 2015-04-01 DIAGNOSIS — Z9181 History of falling: Secondary | ICD-10-CM | POA: Diagnosis not present

## 2015-04-01 DIAGNOSIS — M069 Rheumatoid arthritis, unspecified: Secondary | ICD-10-CM | POA: Diagnosis not present

## 2015-04-02 ENCOUNTER — Ambulatory Visit: Payer: 59 | Admitting: Psychiatry

## 2015-04-04 DIAGNOSIS — E876 Hypokalemia: Secondary | ICD-10-CM | POA: Diagnosis not present

## 2015-04-04 DIAGNOSIS — M069 Rheumatoid arthritis, unspecified: Secondary | ICD-10-CM | POA: Diagnosis not present

## 2015-04-04 DIAGNOSIS — E1165 Type 2 diabetes mellitus with hyperglycemia: Secondary | ICD-10-CM | POA: Diagnosis not present

## 2015-04-04 DIAGNOSIS — D519 Vitamin B12 deficiency anemia, unspecified: Secondary | ICD-10-CM | POA: Diagnosis not present

## 2015-04-05 DIAGNOSIS — I10 Essential (primary) hypertension: Secondary | ICD-10-CM | POA: Diagnosis not present

## 2015-04-05 DIAGNOSIS — E538 Deficiency of other specified B group vitamins: Secondary | ICD-10-CM | POA: Diagnosis not present

## 2015-04-05 DIAGNOSIS — Z794 Long term (current) use of insulin: Secondary | ICD-10-CM | POA: Diagnosis not present

## 2015-04-05 DIAGNOSIS — E119 Type 2 diabetes mellitus without complications: Secondary | ICD-10-CM | POA: Diagnosis not present

## 2015-04-05 DIAGNOSIS — Z9181 History of falling: Secondary | ICD-10-CM | POA: Diagnosis not present

## 2015-04-05 DIAGNOSIS — F4310 Post-traumatic stress disorder, unspecified: Secondary | ICD-10-CM | POA: Diagnosis not present

## 2015-04-05 DIAGNOSIS — E669 Obesity, unspecified: Secondary | ICD-10-CM | POA: Diagnosis not present

## 2015-04-05 DIAGNOSIS — F419 Anxiety disorder, unspecified: Secondary | ICD-10-CM | POA: Diagnosis not present

## 2015-04-05 DIAGNOSIS — M5136 Other intervertebral disc degeneration, lumbar region: Secondary | ICD-10-CM | POA: Diagnosis not present

## 2015-04-05 DIAGNOSIS — F329 Major depressive disorder, single episode, unspecified: Secondary | ICD-10-CM | POA: Diagnosis not present

## 2015-04-05 DIAGNOSIS — R5383 Other fatigue: Secondary | ICD-10-CM | POA: Diagnosis not present

## 2015-04-05 DIAGNOSIS — Z7952 Long term (current) use of systemic steroids: Secondary | ICD-10-CM | POA: Diagnosis not present

## 2015-04-05 DIAGNOSIS — Z6833 Body mass index (BMI) 33.0-33.9, adult: Secondary | ICD-10-CM | POA: Diagnosis not present

## 2015-04-05 DIAGNOSIS — D649 Anemia, unspecified: Secondary | ICD-10-CM | POA: Diagnosis not present

## 2015-04-05 DIAGNOSIS — R809 Proteinuria, unspecified: Secondary | ICD-10-CM | POA: Diagnosis not present

## 2015-04-05 DIAGNOSIS — E1165 Type 2 diabetes mellitus with hyperglycemia: Secondary | ICD-10-CM | POA: Diagnosis not present

## 2015-04-05 DIAGNOSIS — H18039 Corneal deposits in metabolic disorders, unspecified eye: Secondary | ICD-10-CM | POA: Diagnosis not present

## 2015-04-05 DIAGNOSIS — J449 Chronic obstructive pulmonary disease, unspecified: Secondary | ICD-10-CM | POA: Diagnosis not present

## 2015-04-05 DIAGNOSIS — M069 Rheumatoid arthritis, unspecified: Secondary | ICD-10-CM | POA: Diagnosis not present

## 2015-04-11 ENCOUNTER — Encounter: Payer: Self-pay | Admitting: Psychiatry

## 2015-04-11 ENCOUNTER — Ambulatory Visit (INDEPENDENT_AMBULATORY_CARE_PROVIDER_SITE_OTHER): Payer: 59 | Admitting: Psychiatry

## 2015-04-11 VITALS — BP 122/84 | HR 122 | Temp 97.5°F | Ht <= 58 in | Wt 157.2 lb

## 2015-04-11 DIAGNOSIS — M069 Rheumatoid arthritis, unspecified: Secondary | ICD-10-CM | POA: Diagnosis not present

## 2015-04-11 DIAGNOSIS — J449 Chronic obstructive pulmonary disease, unspecified: Secondary | ICD-10-CM | POA: Diagnosis not present

## 2015-04-11 DIAGNOSIS — Z9181 History of falling: Secondary | ICD-10-CM | POA: Diagnosis not present

## 2015-04-11 DIAGNOSIS — F419 Anxiety disorder, unspecified: Secondary | ICD-10-CM | POA: Diagnosis not present

## 2015-04-11 DIAGNOSIS — E669 Obesity, unspecified: Secondary | ICD-10-CM | POA: Diagnosis not present

## 2015-04-11 DIAGNOSIS — F4310 Post-traumatic stress disorder, unspecified: Secondary | ICD-10-CM | POA: Diagnosis not present

## 2015-04-11 DIAGNOSIS — F319 Bipolar disorder, unspecified: Secondary | ICD-10-CM | POA: Diagnosis not present

## 2015-04-11 DIAGNOSIS — Z794 Long term (current) use of insulin: Secondary | ICD-10-CM | POA: Diagnosis not present

## 2015-04-11 DIAGNOSIS — M5136 Other intervertebral disc degeneration, lumbar region: Secondary | ICD-10-CM | POA: Diagnosis not present

## 2015-04-11 DIAGNOSIS — Z7952 Long term (current) use of systemic steroids: Secondary | ICD-10-CM | POA: Diagnosis not present

## 2015-04-11 DIAGNOSIS — Z6833 Body mass index (BMI) 33.0-33.9, adult: Secondary | ICD-10-CM | POA: Diagnosis not present

## 2015-04-11 DIAGNOSIS — I10 Essential (primary) hypertension: Secondary | ICD-10-CM | POA: Diagnosis not present

## 2015-04-11 DIAGNOSIS — F329 Major depressive disorder, single episode, unspecified: Secondary | ICD-10-CM | POA: Diagnosis not present

## 2015-04-11 DIAGNOSIS — F4312 Post-traumatic stress disorder, chronic: Secondary | ICD-10-CM | POA: Diagnosis not present

## 2015-04-11 DIAGNOSIS — E1165 Type 2 diabetes mellitus with hyperglycemia: Secondary | ICD-10-CM | POA: Diagnosis not present

## 2015-04-11 MED ORDER — ESCITALOPRAM OXALATE 20 MG PO TABS: 20.0000 mg | ORAL_TABLET | ORAL | Status: DC

## 2015-04-11 MED ORDER — ALPRAZOLAM 0.25 MG PO TABS: 0.2500 mg | ORAL_TABLET | Freq: Every evening | ORAL | Status: DC | PRN

## 2015-04-11 MED ORDER — LAMOTRIGINE 100 MG PO TABS: 100.0000 mg | ORAL_TABLET | Freq: Every day | ORAL | Status: DC

## 2015-04-11 NOTE — Progress Notes (Signed)
[facility] MD/PA/NP OP Progress Note  04/11/2015 9:33 AM CARLISA EBLE  MRN:  [id number]  Subjective:   Meghan [name]  is a 69 year old female who presented for the follow-up appointment. 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 She reported that her cousin tries to run away from the house most of the time. Her husband works full-time. Patient reported that she has also noticed improvement relationship with her family members. She has been compliant with her medications and her memory is coming back since she has stopped all the sleeping aids. She only takes small dose of alprazolam at bedtime but continues to have some dizziness. She has recently been evaluated by her primary care physician Dr. Clayborn Bigness , and they are evaluating her medications. She is also taking Lexapro and lamotrigine in the morning. Patient reported that her mood symptoms are improving. She denied having any suicidal homicidal ideations or plans. She denied having any perceptual disturbances at this time. She appeared calm and cooperative during the interview.  Chief Complaint:  Chief Complaint    Follow-up; Medication Refill; Anxiety; Depression     Visit Diagnosis:   No diagnosis found.  Past Medical History:  Past Medical History  Diagnosis Date  . COPD (chronic obstructive pulmonary disease)   . Diabetes mellitus   . Hypertension   . Depression   . Anxiety   . Arthritis     RA  . GERD (gastroesophageal reflux disease)   . Osteopenia   . DDD (degenerative disc disease)   . Obese   . PTSD (post-traumatic stress disorder)     Past Surgical History  Procedure Laterality Date  . Hand reconstruction  2011    right-multiple finger joint reconst  . Abdominal hysterectomy    . Cervical fusion    . Joint replacement      bilat knee replacements  . Cholecystectomy    . Foot arthroplasty      toes x2 rt foot  . Finger  arthroplasty  02/23/2012    Procedure: FINGER ARTHROPLASTY;  Surgeon: Cammie Sickle., MD;  Location: [facility];  Service: Orthopedics;  Laterality: Left;  Extensor carpi radialis longus to Extensor carpi ulnaris transfer, left Metaphalangeal reconstructions of index and long fingers,   Family History:  Family History  Problem Relation Age of Onset  . Depression Sister   . CAD    . Hypertension    . Diabetes Mellitus II    . Arthritis    . Hypertension Mother   . Arthritis/Rheumatoid Mother   . Heart attack Father   . Hypertension Father    Social History:  Social History   Social History  . Marital Status: Divorced    Spouse Name: N/A  . Number of Children: N/A  . Years of Education: N/A   Social History Main Topics  . Smoking status: Never Smoker   . Smokeless tobacco: Never Used  . Alcohol Use: No  . Drug Use: No  . Sexual Activity: No   Other Topics Concern  . None   Social History Narrative   Additional History:  Patient currently lives with her family members   Assessment:   Musculoskeletal: Strength & Muscle Tone: within normal limits Gait & Station: normal Patient leans: N/A  Psychiatric Specialty Exam: Anxiety Symptoms include dizziness and nervous/anxious behavior. Patient reports no nausea or palpitations.    Depression  Past medical history includes anxiety.     Review of Systems  Constitutional: Negative for chills.  HENT: Negative for hearing loss.   Eyes: Negative for photophobia.  Respiratory: Negative for sputum production.   Cardiovascular: Negative for palpitations.  Gastrointestinal: Negative for nausea.  Genitourinary: Negative for frequency.  Musculoskeletal: Positive for back pain.  Skin: Negative for rash.  Neurological: Positive for dizziness. Negative for tingling.  Endo/Heme/Allergies: Negative for environmental allergies.  Psychiatric/Behavioral: Positive for depression. Negative for  hallucinations. The patient is nervous/anxious.   All other systems reviewed and are negative.   Blood pressure 122/84, pulse 122, temperature 97.5 F (36.4 C), temperature source Tympanic, height 4' 9.5" (1.461 m), weight 157 lb 3.2 oz (71.305 kg), SpO2 94 %.Body mass index is 33.41 kg/(m^2).  General Appearance: Casual  Eye Contact:  Fair  Speech:  Clear and Coherent  Volume:  Normal  Mood:  Euthymic  Affect:  Congruent  Thought Process:  Goal Directed  Orientation:  Full (Time, Place, and Person)  Thought Content:  WDL  Suicidal Thoughts:  No  Homicidal Thoughts:  No  Memory:  Immediate;   Fair  Judgement:  Fair  Insight:  Fair  Psychomotor Activity:  Normal  Concentration:  Fair  Recall:  [employer] of Knowledge: Fair  Language: Fair  Akathisia:  No  Handed:  Right  AIMS (if indicated):  none  Assets:  Communication Skills Desire for Improvement Physical Health  ADL's:  Intact  Cognition: WNL  Sleep:  6-7    Is the patient at risk to self?  No. Has the patient been a risk to self in the past 6 months?  No. Has the patient been a risk to self within the distant past?  No. Is the patient a risk to others?  No. Has the patient been a risk to others in the past 6 months?  No. Has the patient been a risk to others within the distant past?  No.  Current Medications: Current Outpatient Prescriptions  Medication Sig Dispense Refill  . acyclovir ointment (ZOVIRAX) 5 % Apply topically every 3 (three) hours. Use as needed for blistering. 5 g 0  . albuterol (PROVENTIL HFA;VENTOLIN HFA) 108 (90 BASE) MCG/ACT inhaler Inhale 2 puffs into the lungs every 6 (six) hours as needed for wheezing or shortness of breath.     Marland Kitchen alendronate (FOSAMAX) 70 MG tablet Take 70 mg by mouth once a week. Pt takes on Monday.    . ALPRAZolam (XANAX) 0.25 MG tablet Take by mouth.    Marland Kitchen azelastine (ASTELIN) 0.1 % nasal spray Place 1 spray into both nostrils 2 (two) times daily as needed for rhinitis.      Marland Kitchen azithromycin (ZITHROMAX) 250 MG tablet Take one tablet per day. 4 each 0  . benzonatate (TESSALON) 200 MG capsule Take 1 capsule (200 mg total) by mouth 3 (three) times daily as needed for cough. 20 capsule 0  . budesonide-formoterol (SYMBICORT) 80-4.5 MCG/ACT inhaler Inhale 2 puffs into the lungs 2 (two) times daily.    . Cholecalciferol (VITAMIN D) 2000 UNITS CAPS Take 1 capsule by mouth daily.    . cloNIDine (CATAPRES) 0.1 MG tablet Take 0.1 mg by mouth 2 (two) times daily.    . diphenhydrAMINE (BENADRYL) 25 MG tablet Take 25 mg by mouth every 6 (six) hours as needed for allergies.    Marland Kitchen escitalopram (LEXAPRO) 20 MG tablet Take 1 tablet (20 mg total) by mouth every morning. 30 tablet 1  .  esomeprazole (NEXIUM) 40 MG capsule Take 40 mg by mouth at bedtime.    . fluconazole (DIFLUCAN) 150 MG tablet     . insulin glargine (LANTUS) 100 UNIT/ML injection Inject 0.15 mLs (15 Units total) into the skin at bedtime. 10 mL 11  . insulin starter kit- pen needles MISC 1 kit by Other route once. 1 kit 0  . isosorbide mononitrate (IMDUR) 30 MG 24 hr tablet Take 30 mg by mouth daily.    Marland Kitchen ketoconazole (NIZORAL) 2 % cream Apply 1 application topically at bedtime.    . lamoTRIgine (LAMICTAL) 100 MG tablet Take 1 tablet (100 mg total) by mouth daily. 30 tablet 1  . levocetirizine (XYZAL) 5 MG tablet Take 5 mg by mouth daily.     . meloxicam (MOBIC) 7.5 MG tablet Take 7.5 mg by mouth daily as needed for pain.     . nortriptyline (PAMELOR) 10 MG capsule Take 30 mg by mouth at bedtime.    Marland Kitchen nystatin (MYCOSTATIN) 100000 UNIT/ML suspension     . ondansetron (ZOFRAN-ODT) 8 MG disintegrating tablet Take 8 mg by mouth 3 (three) times daily as needed for nausea or vomiting.    . ONE TOUCH ULTRA TEST test strip     . potassium chloride SA (K-DUR,KLOR-CON) 20 MEQ tablet     . predniSONE (DELTASONE) 5 MG tablet Take 5 mg by mouth at bedtime.     . pregabalin (LYRICA) 50 MG capsule Take 50 mg by mouth 2 (two) times  daily.    . promethazine (PHENERGAN) 25 MG tablet     . saxagliptin HCl (ONGLYZA) 5 MG TABS tablet Take 5 mg by mouth at bedtime.    . sulfamethoxazole-trimethoprim (BACTRIM DS,SEPTRA DS) 800-160 MG per tablet     . Syringe, Disposable, 1 ML MISC 300 Syringes by Does not apply route daily. 300 each 0  . Tofacitinib Citrate 5 MG TABS Take 1 tablet by mouth 2 (two) times daily.     Marland Kitchen zolpidem (AMBIEN) 10 MG tablet Take 10 mg by mouth at bedtime as needed for sleep.     No current facility-administered medications for this visit.    Medical Decision Making:  Established Problem, Stable/Improving (1), Review and summation of old records (2) and Review of Last Therapy Session (1)  Treatment Plan Summary:Medication management  Discussed with patient about the medications  She  will continue on Lexapro 20 mg in the morning and lamotrigine 100 mg at bedtime She will continue on Xanax 0.25 mg at bedtime on a when necessary basis. She will follow-up with her primary care physician Dr. Humphrey Rolls on a regular basis. Follow-up in 3 months or earlier.  More than 50% of the time spent in psychoeducation, counseling and coordination of care.    This note was generated in part or whole with voice recognition software. Voice regonition is usually quite accurate but there are transcription errors that can and very often do occur. I apologize for any typographical errors that were not detected and corrected.   Rainey Pines 04/11/2015, 9:33 AM

## 2015-04-12 DIAGNOSIS — I10 Essential (primary) hypertension: Secondary | ICD-10-CM | POA: Diagnosis not present

## 2015-04-12 DIAGNOSIS — F4310 Post-traumatic stress disorder, unspecified: Secondary | ICD-10-CM | POA: Diagnosis not present

## 2015-04-12 DIAGNOSIS — E1165 Type 2 diabetes mellitus with hyperglycemia: Secondary | ICD-10-CM | POA: Diagnosis not present

## 2015-04-12 DIAGNOSIS — M069 Rheumatoid arthritis, unspecified: Secondary | ICD-10-CM | POA: Diagnosis not present

## 2015-04-12 DIAGNOSIS — Z9181 History of falling: Secondary | ICD-10-CM | POA: Diagnosis not present

## 2015-04-12 DIAGNOSIS — F329 Major depressive disorder, single episode, unspecified: Secondary | ICD-10-CM | POA: Diagnosis not present

## 2015-04-12 DIAGNOSIS — Z794 Long term (current) use of insulin: Secondary | ICD-10-CM | POA: Diagnosis not present

## 2015-04-12 DIAGNOSIS — Z7952 Long term (current) use of systemic steroids: Secondary | ICD-10-CM | POA: Diagnosis not present

## 2015-04-12 DIAGNOSIS — Z6833 Body mass index (BMI) 33.0-33.9, adult: Secondary | ICD-10-CM | POA: Diagnosis not present

## 2015-04-12 DIAGNOSIS — E669 Obesity, unspecified: Secondary | ICD-10-CM | POA: Diagnosis not present

## 2015-04-12 DIAGNOSIS — F419 Anxiety disorder, unspecified: Secondary | ICD-10-CM | POA: Diagnosis not present

## 2015-04-12 DIAGNOSIS — M5136 Other intervertebral disc degeneration, lumbar region: Secondary | ICD-10-CM | POA: Diagnosis not present

## 2015-04-12 DIAGNOSIS — J449 Chronic obstructive pulmonary disease, unspecified: Secondary | ICD-10-CM | POA: Diagnosis not present

## 2015-04-23 DIAGNOSIS — M4806 Spinal stenosis, lumbar region: Secondary | ICD-10-CM | POA: Diagnosis not present

## 2015-04-23 DIAGNOSIS — M4726 Other spondylosis with radiculopathy, lumbar region: Secondary | ICD-10-CM | POA: Diagnosis not present

## 2015-04-23 DIAGNOSIS — M5136 Other intervertebral disc degeneration, lumbar region: Secondary | ICD-10-CM | POA: Diagnosis not present

## 2015-04-23 DIAGNOSIS — M4316 Spondylolisthesis, lumbar region: Secondary | ICD-10-CM | POA: Diagnosis not present

## 2015-04-23 DIAGNOSIS — M5126 Other intervertebral disc displacement, lumbar region: Secondary | ICD-10-CM | POA: Diagnosis not present

## 2015-05-04 ENCOUNTER — Encounter: Payer: Self-pay | Admitting: Emergency Medicine

## 2015-05-04 ENCOUNTER — Emergency Department
Admission: EM | Admit: 2015-05-04 | Discharge: 2015-05-04 | Disposition: A | Discharge status: Home / Self Care | Payer: Medicare Other | Attending: Student | Admitting: Student

## 2015-05-04 DIAGNOSIS — M79622 Pain in left upper arm: Secondary | ICD-10-CM | POA: Insufficient documentation

## 2015-05-04 DIAGNOSIS — I10 Essential (primary) hypertension: Secondary | ICD-10-CM | POA: Diagnosis not present

## 2015-05-04 DIAGNOSIS — Z79899 Other long term (current) drug therapy: Secondary | ICD-10-CM | POA: Diagnosis not present

## 2015-05-04 DIAGNOSIS — Z7951 Long term (current) use of inhaled steroids: Secondary | ICD-10-CM | POA: Insufficient documentation

## 2015-05-04 DIAGNOSIS — E119 Type 2 diabetes mellitus without complications: Secondary | ICD-10-CM | POA: Insufficient documentation

## 2015-05-04 DIAGNOSIS — M7918 Myalgia, other site: Secondary | ICD-10-CM

## 2015-05-04 DIAGNOSIS — M791 Myalgia: Secondary | ICD-10-CM | POA: Diagnosis not present

## 2015-05-04 MED ORDER — HYDROCODONE-ACETAMINOPHEN 5-325 MG PO TABS: 1.0000 | ORAL_TABLET | ORAL | Status: DC | PRN

## 2015-05-04 MED ORDER — LIDOCAINE 4 % EX PTCH: 1.0000 | MEDICATED_PATCH | Freq: Every day | CUTANEOUS | Status: DC

## 2015-05-04 NOTE — ED Provider Notes (Signed)
[facility] Emergency Department Meghan [name] Note ____________________________________________  Time seen: Approximately 7:39 AM  I have reviewed the triage vital signs and the nursing notes.   HISTORY  Chief Complaint Arm Pain   HPI Meghan [name] is a 68 y.o. female since to the emergency department for evaluation of left arm pain. She states that approximately 6 weeks ago she received B12 injection at her primary care Meghan [name]'s office. She developed pain in that area 2-3 days after the injection. Pain covers her upper arm from elbow to shoulder. She states it is worse with movement. She describes the pain as a soreness and tenderness. She has no numbness or tingling. She has not noticed any bruising or redness at the injection site. She has not noted any mass or any type of lesion at the site. She denies fever. She denies any other type of injury or possibility of muscle strain.   Past Medical History  Diagnosis Date  . COPD (chronic obstructive pulmonary disease)   . Diabetes mellitus   . Hypertension   . Depression   . Anxiety   . Arthritis     RA  . GERD (gastroesophageal reflux disease)   . Osteopenia   . DDD (degenerative disc disease)   . Obese   . PTSD (post-traumatic stress disorder)     Patient Active Problem List   Diagnosis Date Noted  . Asthma with acute exacerbation 03/10/2015  . Hypokalemia 03/01/2015  . Hyponatremia 03/01/2015  . DDD (degenerative disc disease), lumbar 01/31/2015  . Arthritis, degenerative 01/31/2015  . Rheumatoid arthritis with rheumatoid factor 01/31/2015  . Diabetes mellitus, type II 12/24/2014  . HTN (hypertension) 12/24/2014  . Sepsis 12/24/2014  . Left knee pain 12/24/2014  . GERD (gastroesophageal reflux disease) 12/24/2014  . COPD (chronic obstructive pulmonary disease) 12/24/2014  . Depression 12/24/2014  . Anxiety 12/24/2014  . Severe bipolar disorder with psychotic features, mood-congruent 11/16/2014   . Neurosis, posttraumatic 11/16/2014  . H/O diabetes mellitus 11/16/2014  . H/O: HTN (hypertension) 11/16/2014  . H/O gastric ulcer 11/16/2014  . H/O: obesity 11/16/2014  . H/O arthritis 11/16/2014  . Barton's fracture of distal radius, closed 09/19/2014  . Neuritis or radiculitis due to rupture of lumbar intervertebral disc 05/11/2014  . Cervico-occipital neuralgia 03/26/2014  . Difficulty in walking 03/26/2014  . Cervical spine syndrome 01/26/2014  . Cephalalgia 01/09/2014  . Disordered sleep 01/09/2014  . Diabetes 10/19/2013  . BP (high blood pressure) 10/19/2013  . Adiposity 10/19/2013  . Cardiac murmur 10/19/2013  . PNA (pneumonia) 10/19/2013  . Breath shortness 10/19/2013    Past Surgical History  Procedure Laterality Date  . Hand reconstruction  2011    right-multiple finger joint reconst  . Abdominal hysterectomy    . Cervical fusion    . Joint replacement      bilat knee replacements  . Cholecystectomy    . Foot arthroplasty      toes x2 rt foot  . Finger arthroplasty  02/23/2012    Procedure: FINGER ARTHROPLASTY;  Surgeon: Cammie Sickle., MD;  Location: [place] [facility];  Service: Orthopedics;  Laterality: Left;  Extensor carpi radialis longus to Extensor carpi ulnaris transfer, left Metaphalangeal reconstructions of index and long fingers,    Current Outpatient Rx  Name  Route  Sig  Dispense  Refill  . acyclovir ointment (ZOVIRAX) 5 %   Topical   Apply topically every 3 (three) hours. Use as needed for blistering.   5 g  0   . albuterol (PROVENTIL HFA;VENTOLIN HFA) 108 (90 BASE) MCG/ACT inhaler   Inhalation   Inhale 2 puffs into the lungs every 6 (six) hours as needed for wheezing or shortness of breath.          Marland Kitchen alendronate (FOSAMAX) 70 MG tablet   Oral   Take 70 mg by mouth once a week. Pt takes on Monday.         . ALPRAZolam (XANAX) 0.25 MG tablet   Oral   Take 1 tablet (0.25 mg total) by mouth at bedtime as needed for  anxiety.   30 tablet   3   . azelastine (ASTELIN) 0.1 % nasal spray   Each Nare   Place 1 spray into both nostrils 2 (two) times daily as needed for rhinitis.          Marland Kitchen azithromycin (ZITHROMAX) 250 MG tablet      Take one tablet per day.   4 each   0   . benzonatate (TESSALON) 200 MG capsule   Oral   Take 1 capsule (200 mg total) by mouth 3 (three) times daily as needed for cough.   20 capsule   0   . budesonide-formoterol (SYMBICORT) 80-4.5 MCG/ACT inhaler   Inhalation   Inhale 2 puffs into the lungs 2 (two) times daily.         . Cholecalciferol (VITAMIN D) 2000 UNITS CAPS   Oral   Take 1 capsule by mouth daily.         . cloNIDine (CATAPRES) 0.1 MG tablet   Oral   Take 0.1 mg by mouth 2 (two) times daily.         . diphenhydrAMINE (BENADRYL) 25 MG tablet   Oral   Take 25 mg by mouth every 6 (six) hours as needed for allergies.         Marland Kitchen escitalopram (LEXAPRO) 20 MG tablet   Oral   Take 1 tablet (20 mg total) by mouth every morning.   30 tablet   3   . esomeprazole (NEXIUM) 40 MG capsule   Oral   Take 40 mg by mouth at bedtime.         . fluconazole (DIFLUCAN) 150 MG tablet               . HYDROcodone-acetaminophen (NORCO/VICODIN) 5-325 MG per tablet   Oral   Take 1 tablet by mouth every 4 (four) hours as needed for moderate pain.   12 tablet   0   . insulin glargine (LANTUS) 100 UNIT/ML injection   Subcutaneous   Inject 0.15 mLs (15 Units total) into the skin at bedtime.   10 mL   11   . insulin starter kit- pen needles MISC   Other   1 kit by Other route once.   1 kit   0   . isosorbide mononitrate (IMDUR) 30 MG 24 hr tablet   Oral   Take 30 mg by mouth daily.         Marland Kitchen ketoconazole (NIZORAL) 2 % cream   Topical   Apply 1 application topically at bedtime.         . lamoTRIgine (LAMICTAL) 100 MG tablet   Oral   Take 1 tablet (100 mg total) by mouth daily.   30 tablet   3   . levocetirizine (XYZAL) 5 MG tablet    Oral   Take 5 mg by mouth daily.          Marland Kitchen  Lidocaine 4 % PTCH   Apply externally   Apply 1 patch topically daily. Remove after 12 hours. No more than 3 patches at a time   15 patch   0   . meloxicam (MOBIC) 7.5 MG tablet   Oral   Take 7.5 mg by mouth daily as needed for pain.          Marland Kitchen nystatin (MYCOSTATIN) 100000 UNIT/ML suspension               . ondansetron (ZOFRAN-ODT) 8 MG disintegrating tablet   Oral   Take 8 mg by mouth 3 (three) times daily as needed for nausea or vomiting.         . ONE TOUCH ULTRA TEST test strip                 Dispense as written.   . potassium chloride SA (K-DUR,KLOR-CON) 20 MEQ tablet               . predniSONE (DELTASONE) 5 MG tablet   Oral   Take 5 mg by mouth at bedtime.          . pregabalin (LYRICA) 50 MG capsule   Oral   Take 50 mg by mouth 2 (two) times daily.         . promethazine (PHENERGAN) 25 MG tablet               . saxagliptin HCl (ONGLYZA) 5 MG TABS tablet   Oral   Take 5 mg by mouth at bedtime.         . sulfamethoxazole-trimethoprim (BACTRIM DS,SEPTRA DS) 800-160 MG per tablet               . Syringe, Disposable, 1 ML MISC   Does not apply   300 Syringes by Does not apply route daily.   300 each   0   . Tofacitinib Citrate 5 MG TABS   Oral   Take 1 tablet by mouth 2 (two) times daily.            Allergies Gabapentin; Iodine; Naproxen; Nsaids; and Tramadol  Family History  Problem Relation Age of Onset  . Depression Sister   . CAD    . Hypertension    . Diabetes Mellitus II    . Arthritis    . Hypertension Mother   . Arthritis/Rheumatoid Mother   . Heart attack Father   . Hypertension Father     Social History Social History  Substance Use Topics  . Smoking status: Never Smoker   . Smokeless tobacco: Never Used  . Alcohol Use: No    Review of Systems Constitutional: No recent illness. Eyes: No visual changes. ENT: No sore throat. Cardiovascular: Denies  chest pain or palpitations. Respiratory: Denies shortness of breath. Gastrointestinal: No abdominal pain.  Genitourinary: Negative for dysuria. Musculoskeletal: Positive for left arm pain Skin: Negative for rash. Neurological: Negative for headaches, focal weakness or numbness. 10-point ROS otherwise negative.  ____________________________________________   PHYSICAL EXAM:  VITAL SIGNS: ED Triage Vitals  Enc Vitals Group     BP 05/04/15 0655 151/87 mmHg     Pulse Rate 05/04/15 0655 110     Resp 05/04/15 0655 18     Temp 05/04/15 0655 98.7 F (37.1 C)     Temp Source 05/04/15 0655 Oral     SpO2 05/04/15 0655 97 %     Weight 05/04/15 0655 157 lb (71.215 kg)     Height 05/04/15  5681 4' 9"  (1.448 m)     Head Cir --      Peak Flow --      Pain Score 05/04/15 0656 8     Pain Loc --      Pain Edu? --      Excl. in [place]? --     Constitutional: Alert and oriented. Well appearing and in no acute distress. Eyes: Conjunctivae are normal. EOMI. Head: Atraumatic. Nose: No congestion/rhinnorhea. Neck: No stridor.  Respiratory: Normal respiratory effort.   Musculoskeletal: Diffuse tenderness over the left deltoid muscle without erythema, mass, ecchymosis/contusion, lesion. Pain is specifically elicited upon abduction of the arm. Neurologic:  Normal speech and language. No gross focal neurologic deficits are appreciated. Speech is normal. No gait instability. Skin:  Skin is warm, dry and intact. Atraumatic. Psychiatric: Mood and affect are normal. Speech and behavior are normal.  ____________________________________________   LABS ([name] labs ordered are listed, but only abnormal results are displayed)  Labs Reviewed - No data to display ____________________________________________  RADIOLOGY  Not indicated ____________________________________________   PROCEDURES  Procedure(s) performed: None   ____________________________________________   INITIAL IMPRESSION /  ASSESSMENT AND PLAN / ED COURSE  Pertinent labs & imaging results that were available during my care of the patient were reviewed by me and considered in my medical decision making (see chart for details).  Lidocaine patch and Norco at bedtime will be given to help with her pain. She was advised to follow-up with her primary care Morrie Daywalt next week. She was encouraged to return to the emergency department for symptoms that change or worsen if she is unable schedule an appointment. ____________________________________________   FINAL CLINICAL IMPRESSION(S) / ED DIAGNOSES  Final diagnoses:  Musculoskeletal pain       Victorino Dike, FNP 05/04/15 2751  Joanne Gavel, MD 05/04/15 1530

## 2015-05-04 NOTE — ED Notes (Signed)
Pt discharged home after verbalizing understanding of discharge instructions; nad noted. 

## 2015-05-04 NOTE — ED Notes (Signed)
Pt presents c/o pain to her left arm from a B12 injection 6 weeks ago. State that she was told to use ice packs and heating pad for relief, but no did not help much. Pt states that she has pain running from her neck all the way down to her elbow. Pt also c/o "tingling sensation" in her lips x 2 weeks. Pt in NAD.

## 2015-05-04 NOTE — ED Notes (Signed)
Pt arrived to the ED for complaints of left arm pain. Pt states that she received a "shot" on her left arm and it has been hurting ever since, now radiating to the lower arm. Pt is AOx4 in no apparent distress.

## 2015-05-04 NOTE — ED Notes (Signed)
.  dschg

## 2015-05-08 DIAGNOSIS — M069 Rheumatoid arthritis, unspecified: Secondary | ICD-10-CM | POA: Diagnosis not present

## 2015-05-08 DIAGNOSIS — M65822 Other synovitis and tenosynovitis, left upper arm: Secondary | ICD-10-CM | POA: Diagnosis not present

## 2015-05-08 DIAGNOSIS — M79602 Pain in left arm: Secondary | ICD-10-CM | POA: Diagnosis not present

## 2015-05-08 DIAGNOSIS — R1901 Right upper quadrant abdominal swelling, mass and lump: Secondary | ICD-10-CM | POA: Diagnosis not present

## 2015-05-15 IMAGING — CT CT HEAD WITHOUT CONTRAST
1 series · 16 of 30 positions shown, 20 images · non-contrast
Comparison: CT head without contrast 07/30/2012.

CLINICAL DATA: Trauma to head. Chronic headaches. The patient
states his headaches are worse today.

EXAM:
CT HEAD WITHOUT CONTRAST
TECHNIQUE: Contiguous axial images were obtained from the base of the skull
through the vertex without intravenous contrast.

[Series 2: head wo · axial · 0.40mm/px · z∈[+348,+474]mm · 16 of 32 slices shown, 20 images]
[im 2/32  brain]
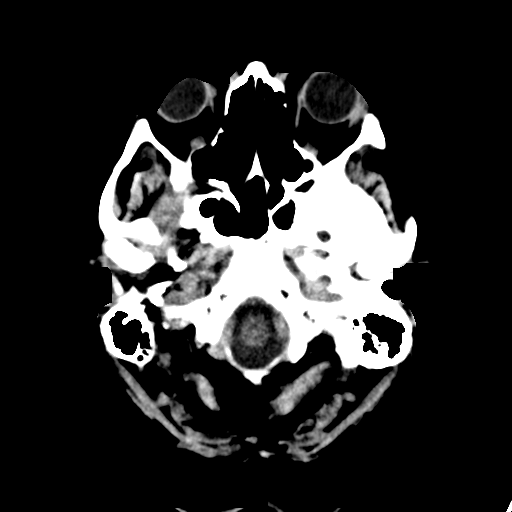
[im 2/32  bone]
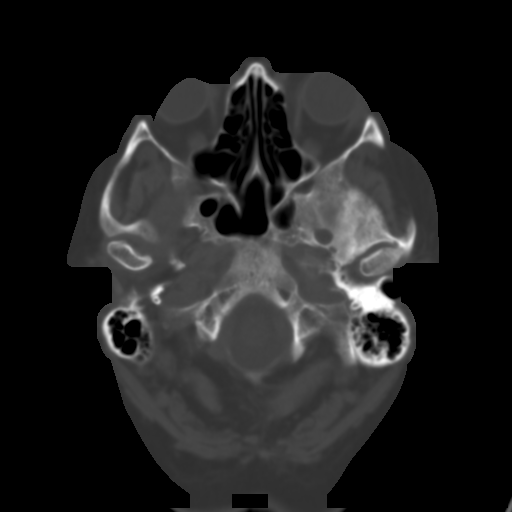
[im 4/32  brain]
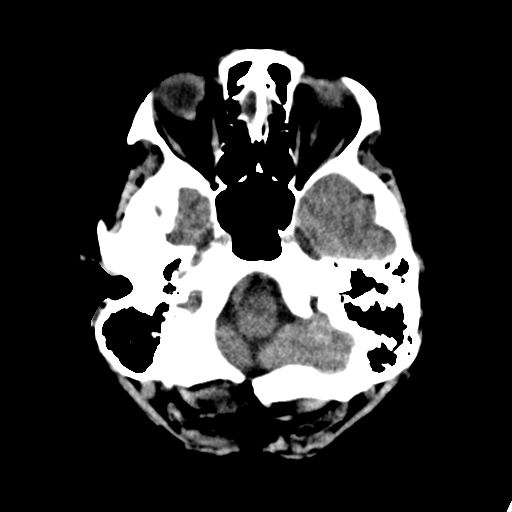
[im 6/32  brain]
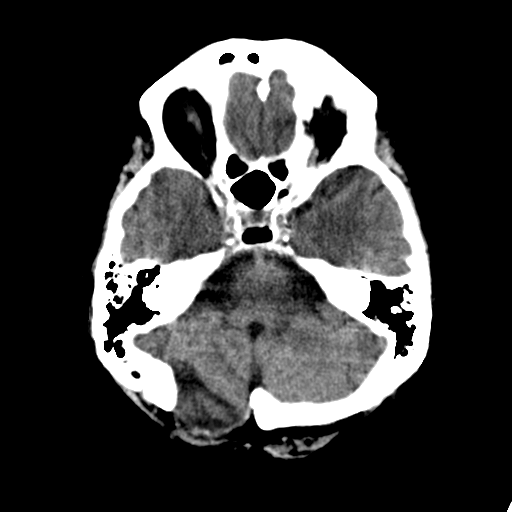
[im 8/32  brain]
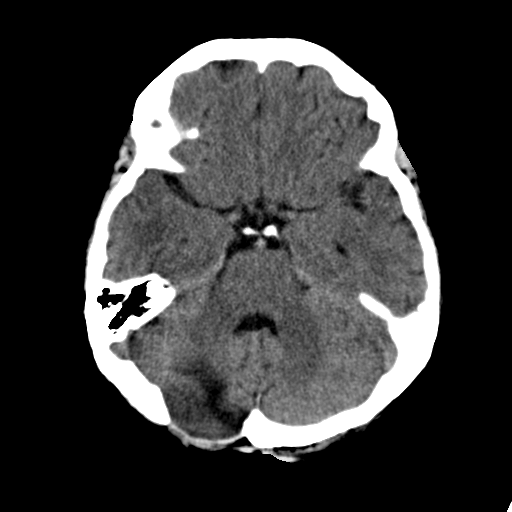
[im 9/32  brain]
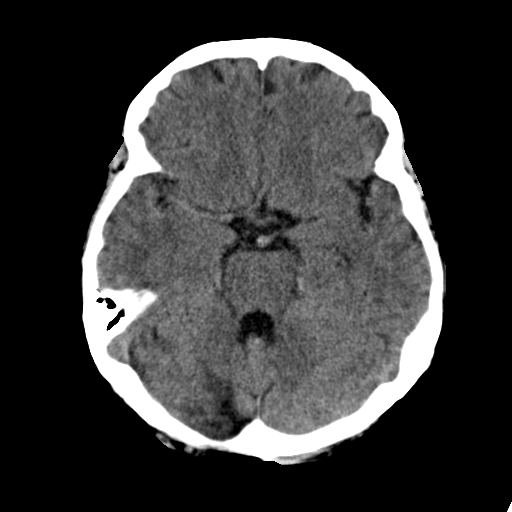
[im 9/32  bone]
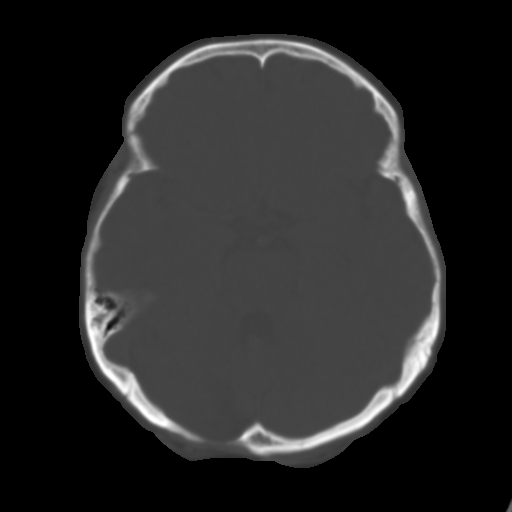
[im 11/32  brain]
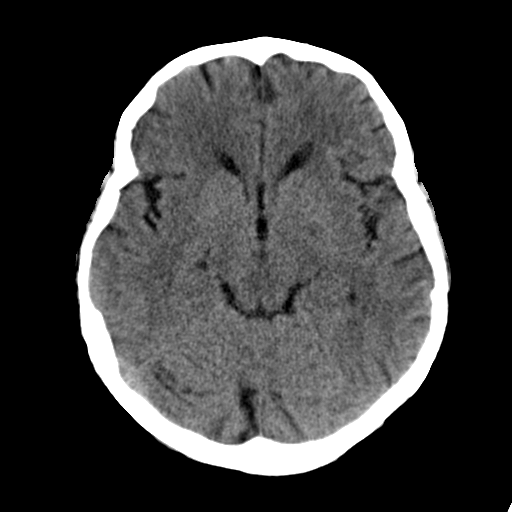
[im 13/32  brain]
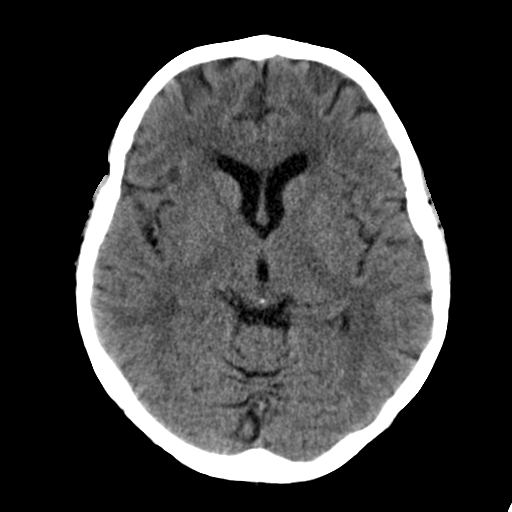
[im 15/32  brain]
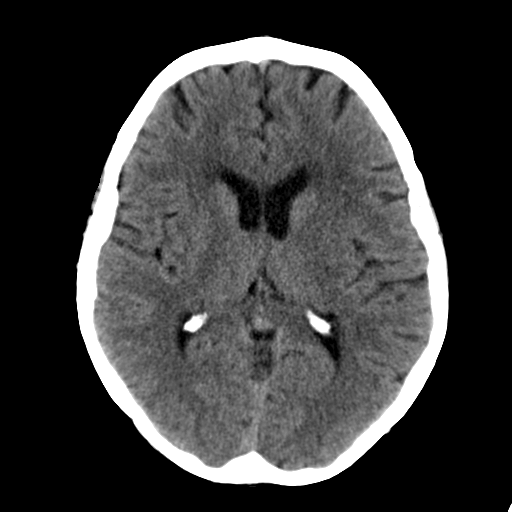
[im 17/32  brain]
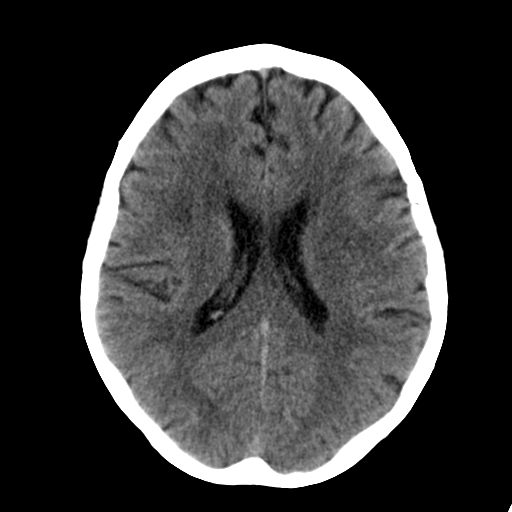
[im 17/32  bone]
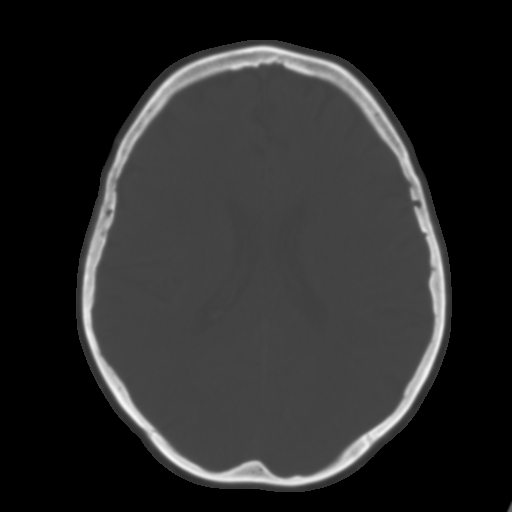
[im 19/32  brain]
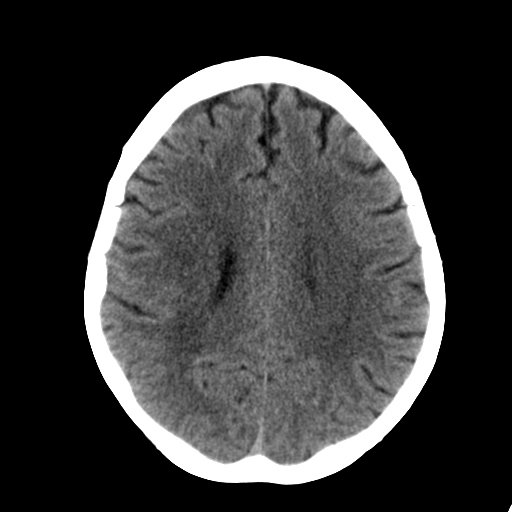
[im 21/32  brain]
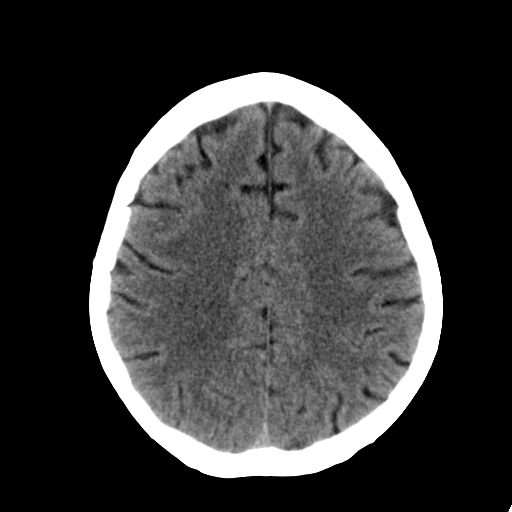
[im 23/32  brain]
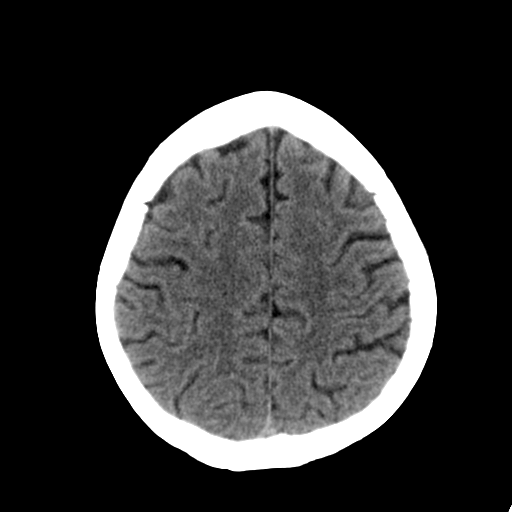
[im 24/32  brain]
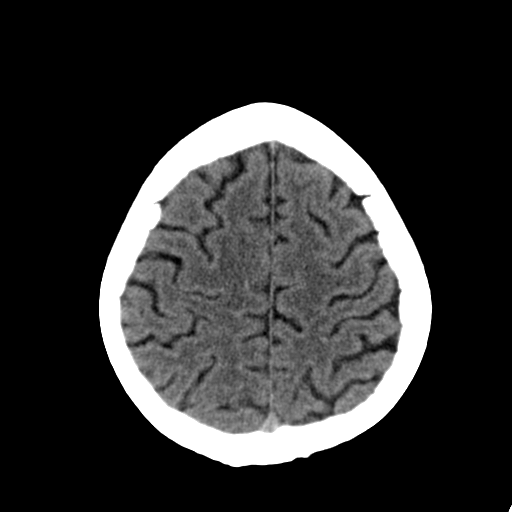
[im 24/32  bone]
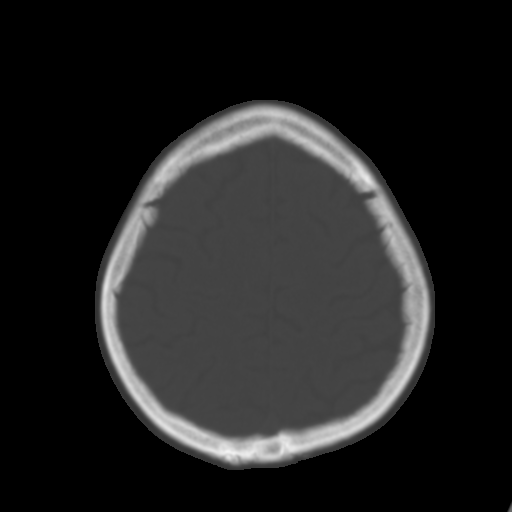
[im 26/32  brain]
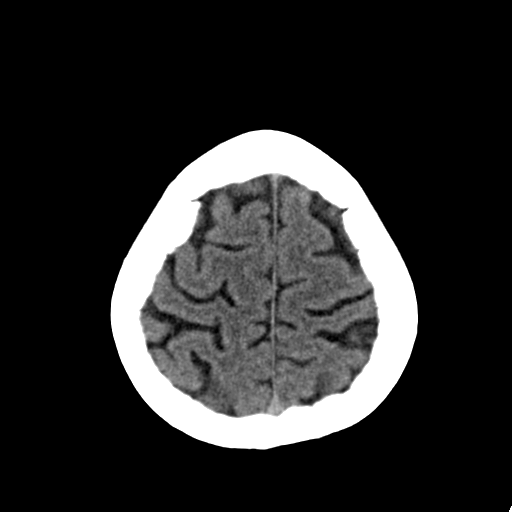
[im 28/32  brain]
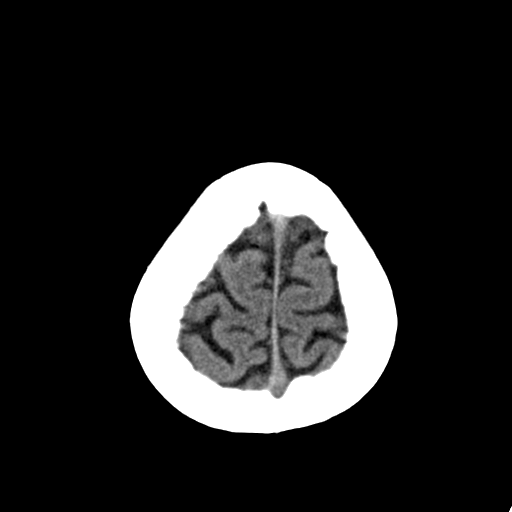
[im 30/32  brain]
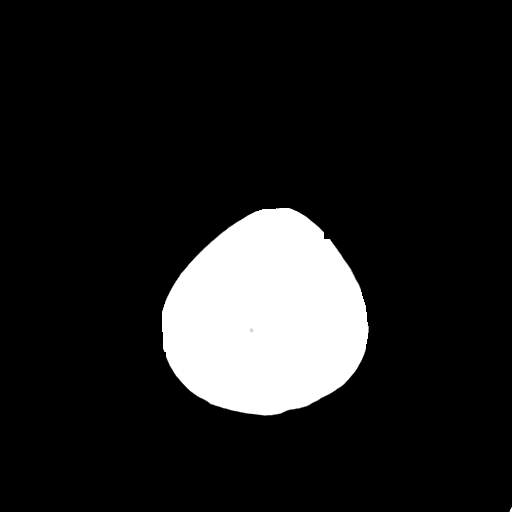

[16 of 30 positions shown; findings below may reference images not displayed]

FINDINGS: Postoperative changes in encephalomalacia in the right cerebellum
are stable. No acute cortical infarct, hemorrhage, or mass lesion is
present. Scattered subcortical white matter changes are stable
bilaterally. The ventricles are of normal size. No significant
extra-axial fluid collection is present.

The paranasal sinuses and mastoid air cells are clear. The osseous
skull is intact.
IMPRESSION: 1. No acute intracranial abnormality or significant interval change.
2. Stable postoperative changes of the right cerebellum.
3. Stable diffuse white matter disease.

## 2015-05-22 DIAGNOSIS — R1901 Right upper quadrant abdominal swelling, mass and lump: Secondary | ICD-10-CM | POA: Diagnosis not present

## 2015-05-22 DIAGNOSIS — R1011 Right upper quadrant pain: Secondary | ICD-10-CM | POA: Diagnosis not present

## 2015-06-03 ENCOUNTER — Other Ambulatory Visit: Payer: Self-pay | Admitting: Nurse Practitioner

## 2015-06-03 DIAGNOSIS — R1901 Right upper quadrant abdominal swelling, mass and lump: Secondary | ICD-10-CM | POA: Diagnosis not present

## 2015-06-03 DIAGNOSIS — M064 Inflammatory polyarthropathy: Secondary | ICD-10-CM | POA: Diagnosis not present

## 2015-06-03 DIAGNOSIS — E1165 Type 2 diabetes mellitus with hyperglycemia: Secondary | ICD-10-CM | POA: Diagnosis not present

## 2015-06-03 DIAGNOSIS — E876 Hypokalemia: Secondary | ICD-10-CM | POA: Diagnosis not present

## 2015-06-03 DIAGNOSIS — Z23 Encounter for immunization: Secondary | ICD-10-CM | POA: Diagnosis not present

## 2015-06-03 DIAGNOSIS — M79602 Pain in left arm: Secondary | ICD-10-CM | POA: Diagnosis not present

## 2015-06-03 DIAGNOSIS — R1084 Generalized abdominal pain: Secondary | ICD-10-CM

## 2015-06-03 DIAGNOSIS — Z0001 Encounter for general adult medical examination with abnormal findings: Secondary | ICD-10-CM | POA: Diagnosis not present

## 2015-06-03 DIAGNOSIS — E782 Mixed hyperlipidemia: Secondary | ICD-10-CM | POA: Diagnosis not present

## 2015-06-03 DIAGNOSIS — D509 Iron deficiency anemia, unspecified: Secondary | ICD-10-CM | POA: Diagnosis not present

## 2015-06-10 ENCOUNTER — Ambulatory Visit
Admission: RE | Admit: 2015-06-10 | Discharge: 2015-06-10 | Disposition: A | Discharge status: Home / Self Care | Payer: Medicare Other | Source: Ambulatory Visit | Attending: Nurse Practitioner | Admitting: Nurse Practitioner

## 2015-06-10 DIAGNOSIS — M129 Arthropathy, unspecified: Secondary | ICD-10-CM | POA: Diagnosis not present

## 2015-06-10 DIAGNOSIS — R1084 Generalized abdominal pain: Secondary | ICD-10-CM | POA: Diagnosis not present

## 2015-06-10 DIAGNOSIS — R140 Abdominal distension (gaseous): Secondary | ICD-10-CM | POA: Insufficient documentation

## 2015-06-10 DIAGNOSIS — R109 Unspecified abdominal pain: Secondary | ICD-10-CM | POA: Diagnosis not present

## 2015-06-10 DIAGNOSIS — I2510 Atherosclerotic heart disease of native coronary artery without angina pectoris: Secondary | ICD-10-CM | POA: Diagnosis not present

## 2015-06-10 DIAGNOSIS — R935 Abnormal findings on diagnostic imaging of other abdominal regions, including retroperitoneum: Secondary | ICD-10-CM | POA: Diagnosis not present

## 2015-06-10 DIAGNOSIS — K439 Ventral hernia without obstruction or gangrene: Secondary | ICD-10-CM | POA: Insufficient documentation

## 2015-06-24 DIAGNOSIS — E2839 Other primary ovarian failure: Secondary | ICD-10-CM | POA: Diagnosis not present

## 2015-06-24 DIAGNOSIS — Z1231 Encounter for screening mammogram for malignant neoplasm of breast: Secondary | ICD-10-CM | POA: Diagnosis not present

## 2015-06-24 DIAGNOSIS — M85862 Other specified disorders of bone density and structure, left lower leg: Secondary | ICD-10-CM | POA: Diagnosis not present

## 2015-07-01 DIAGNOSIS — J449 Chronic obstructive pulmonary disease, unspecified: Secondary | ICD-10-CM | POA: Diagnosis not present

## 2015-07-01 DIAGNOSIS — I10 Essential (primary) hypertension: Secondary | ICD-10-CM | POA: Diagnosis not present

## 2015-07-01 DIAGNOSIS — J0191 Acute recurrent sinusitis, unspecified: Secondary | ICD-10-CM | POA: Diagnosis not present

## 2015-07-01 DIAGNOSIS — D519 Vitamin B12 deficiency anemia, unspecified: Secondary | ICD-10-CM | POA: Diagnosis not present

## 2015-07-01 DIAGNOSIS — R10817 Generalized abdominal tenderness: Secondary | ICD-10-CM | POA: Diagnosis not present

## 2015-07-08 DIAGNOSIS — R935 Abnormal findings on diagnostic imaging of other abdominal regions, including retroperitoneum: Secondary | ICD-10-CM | POA: Diagnosis not present

## 2015-07-08 DIAGNOSIS — Z86010 Personal history of colonic polyps: Secondary | ICD-10-CM | POA: Diagnosis not present

## 2015-07-08 DIAGNOSIS — K5900 Constipation, unspecified: Secondary | ICD-10-CM | POA: Diagnosis not present

## 2015-07-08 DIAGNOSIS — R1084 Generalized abdominal pain: Secondary | ICD-10-CM | POA: Diagnosis not present

## 2015-07-10 NOTE — Progress Notes (Signed)
As of 04-11-15 pt was still taking. Reordered.

## 2015-07-15 ENCOUNTER — Ambulatory Visit (INDEPENDENT_AMBULATORY_CARE_PROVIDER_SITE_OTHER): Payer: 59 | Admitting: Psychiatry

## 2015-07-15 ENCOUNTER — Encounter: Payer: Self-pay | Admitting: Psychiatry

## 2015-07-15 VITALS — BP 130/84 | HR 108 | Temp 98.1°F | Ht <= 58 in | Wt 155.2 lb

## 2015-07-15 DIAGNOSIS — M5136 Other intervertebral disc degeneration, lumbar region: Secondary | ICD-10-CM | POA: Insufficient documentation

## 2015-07-15 DIAGNOSIS — M059 Rheumatoid arthritis with rheumatoid factor, unspecified: Secondary | ICD-10-CM | POA: Insufficient documentation

## 2015-07-15 DIAGNOSIS — F3160 Bipolar disorder, current episode mixed, unspecified: Secondary | ICD-10-CM | POA: Diagnosis not present

## 2015-07-15 MED ORDER — ALPRAZOLAM 0.5 MG PO TABS: 0.5000 mg | ORAL_TABLET | Freq: Every evening | ORAL | Status: DC | PRN

## 2015-07-15 MED ORDER — LAMOTRIGINE 100 MG PO TABS: 100.0000 mg | ORAL_TABLET | Freq: Every day | ORAL | Status: DC

## 2015-07-15 MED ORDER — ESCITALOPRAM OXALATE 20 MG PO TABS: 20.0000 mg | ORAL_TABLET | ORAL | Status: DC

## 2015-07-15 NOTE — Progress Notes (Signed)
[facility] MD/PA/NP OP Progress Note  07/15/2015 1:55 PM Meghan [name]  MRN:  [id number]  Subjective:   Meghan [name]  is a 69 year old female who presented for the follow-up appointment. She reported that her granddaughter took away her baby from her and she is unable to see her great grandson now. She was anxious and distressed about the same. Patient reported that she is trying to make plans so she can visit her family now. She reported that she has been taking her medications as prescribed but she is unable to sleep because of the stress related to her family members. She reported that December is also a tough month for her due to previous death anniversaries of her family members. She has been taking Xanax at night but it is not helpful. She wants to go higher on the dose of Xanax. She currently denied having any suicidal ideations or plans. She spends time with her friends and family. She reported that she does not have any perceptual disturbances she denied having any suicidal homicidal ideations or plans.   Chief Complaint:  Chief Complaint    Follow-up; Medication Refill     Visit Diagnosis:     ICD-9-CM ICD-10-CM   1. Bipolar I disorder, most recent episode mixed ([place]) 296.60 F31.60     Past Medical History:  Past Medical History  Diagnosis Date  . COPD (chronic obstructive pulmonary disease) ([place])   . Diabetes mellitus   . Hypertension   . Depression   . Anxiety   . Arthritis     RA  . GERD (gastroesophageal reflux disease)   . Osteopenia   . DDD (degenerative disc disease)   . Obese   . PTSD (post-traumatic stress disorder)     Past Surgical History  Procedure Laterality Date  . Hand reconstruction  2011    right-multiple finger joint reconst  . Abdominal hysterectomy    . Cervical fusion    . Joint replacement      bilat knee replacements  . Cholecystectomy    . Foot arthroplasty      toes x2 rt foot  . Finger arthroplasty  02/23/2012    Procedure: FINGER ARTHROPLASTY;   Surgeon: Cammie Sickle., MD;  Location: [facility];  Service: Orthopedics;  Laterality: Left;  Extensor carpi radialis longus to Extensor carpi ulnaris transfer, left Metaphalangeal reconstructions of index and long fingers,   Family History:  Family History  Problem Relation Age of Onset  . Depression Sister   . CAD    . Hypertension    . Diabetes Mellitus II    . Arthritis    . Hypertension Mother   . Arthritis/Rheumatoid Mother   . Heart attack Father   . Hypertension Father    Social History:  Social History   Social History  . Marital Status: Divorced    Spouse Name: N/A  . Number of Children: N/A  . Years of Education: N/A   Social History Main Topics  . Smoking status: Never Smoker   . Smokeless tobacco: Never Used  . Alcohol Use: No  . Drug Use: No  . Sexual Activity: No   Other Topics Concern  . None   Social History Narrative   Additional History:  Patient currently lives with her family members   Assessment:   Musculoskeletal: Strength & Muscle Tone: within normal limits Gait & Station: normal Patient leans: N/A  Psychiatric Specialty Exam: Anxiety Symptoms include dizziness and nervous/anxious behavior. Patient reports no nausea  or palpitations.      Review of Systems  Constitutional: Negative for chills.  HENT: Negative for hearing loss.   Eyes: Negative for photophobia.  Respiratory: Negative for sputum production.   Cardiovascular: Negative for palpitations.  Gastrointestinal: Negative for nausea.  Genitourinary: Negative for frequency.  Musculoskeletal: Positive for back pain.  Skin: Negative for rash.  Neurological: Positive for dizziness. Negative for tingling.  Endo/Heme/Allergies: Negative for environmental allergies.  Psychiatric/Behavioral: Positive for depression. Negative for hallucinations. The patient is nervous/anxious.   All other systems reviewed and are negative.   Blood pressure 130/84, pulse 108,  temperature 98.1 F (36.7 C), temperature source Tympanic, height 4' 9"  (1.448 m), weight 155 lb 3.2 oz (70.398 kg), SpO2 92 %.Body mass index is 33.58 kg/(m^2).  General Appearance: Casual  Eye Contact:  Fair  Speech:  Clear and Coherent  Volume:  Normal  Mood:  Anxious  Affect:  Congruent  Thought Process:  Goal Directed  Orientation:  Full (Time, Place, and Person)  Thought Content:  WDL  Suicidal Thoughts:  No  Homicidal Thoughts:  No  Memory:  Immediate;   Fair  Judgement:  Fair  Insight:  Fair  Psychomotor Activity:  Normal  Concentration:  Fair  Recall:  [employer] of Knowledge: Fair  Language: Fair  Akathisia:  No  Handed:  Right  AIMS (if indicated):  none  Assets:  Communication Skills Desire for Improvement Physical Health  ADL's:  Intact  Cognition: WNL  Sleep:  6-7    Is the patient at risk to self?  No. Has the patient been a risk to self in the past 6 months?  No. Has the patient been a risk to self within the distant past?  No. Is the patient a risk to others?  No. Has the patient been a risk to others in the past 6 months?  No. Has the patient been a risk to others within the distant past?  No.  Current Medications: Current Outpatient Prescriptions  Medication Sig Dispense Refill  . acyclovir ointment (ZOVIRAX) 5 % Apply topically every 3 (three) hours. Use as needed for blistering. 5 g 0  . albuterol (PROVENTIL HFA;VENTOLIN HFA) 108 (90 BASE) MCG/ACT inhaler Inhale 2 puffs into the lungs every 6 (six) hours as needed for wheezing or shortness of breath.     Marland Kitchen alendronate (FOSAMAX) 70 MG tablet Take 70 mg by mouth once a week. Pt takes on Monday.    . ALPRAZolam (XANAX) 0.25 MG tablet Take 1 tablet (0.25 mg total) by mouth at bedtime as needed for anxiety. 30 tablet 3  . azelastine (ASTELIN) 0.1 % nasal spray Place 1 spray into both nostrils 2 (two) times daily as needed for rhinitis.     Marland Kitchen azithromycin (ZITHROMAX) 250 MG tablet Take one tablet per day.  4 each 0  . benzonatate (TESSALON) 200 MG capsule Take 1 capsule (200 mg total) by mouth 3 (three) times daily as needed for cough. 20 capsule 0  . bismuth subsalicylate (PEPTO BISMOL) 262 MG chewable tablet Chew by mouth.    . budesonide-formoterol (SYMBICORT) 80-4.5 MCG/ACT inhaler Inhale 2 puffs into the lungs 2 (two) times daily.    . Cholecalciferol (VITAMIN D) 2000 UNITS CAPS Take 1 capsule by mouth daily.    . cloNIDine (CATAPRES) 0.1 MG tablet Take 0.1 mg by mouth 2 (two) times daily.    . diphenhydrAMINE (BENADRYL) 25 MG tablet Take 25 mg by mouth every 6 (six) hours as needed for  allergies.    Marland Kitchen escitalopram (LEXAPRO) 20 MG tablet Take 1 tablet (20 mg total) by mouth every morning. 30 tablet 3  . esomeprazole (NEXIUM) 40 MG capsule Take 40 mg by mouth at bedtime.    . fluconazole (DIFLUCAN) 150 MG tablet     . HYDROcodone-acetaminophen (NORCO/VICODIN) 5-325 MG per tablet Take 1 tablet by mouth every 4 (four) hours as needed for moderate pain. 12 tablet 0  . insulin glargine (LANTUS) 100 UNIT/ML injection Inject 0.15 mLs (15 Units total) into the skin at bedtime. 10 mL 11  . insulin starter kit- pen needles MISC 1 kit by Other route once. 1 kit 0  . isosorbide mononitrate (IMDUR) 30 MG 24 hr tablet Take 30 mg by mouth daily.    Marland Kitchen ketoconazole (NIZORAL) 2 % cream Apply 1 application topically at bedtime.    Marland Kitchen levocetirizine (XYZAL) 5 MG tablet Take 5 mg by mouth daily.     . Lidocaine 4 % PTCH Apply 1 patch topically daily. Remove after 12 hours. No more than 3 patches at a time 15 patch 0  . meloxicam (MOBIC) 7.5 MG tablet Take 7.5 mg by mouth daily as needed for pain.     . metroNIDAZOLE (FLAGYL) 250 MG tablet Take by mouth.    . nystatin (MYCOSTATIN) 100000 UNIT/ML suspension     . ondansetron (ZOFRAN-ODT) 8 MG disintegrating tablet Take 8 mg by mouth 3 (three) times daily as needed for nausea or vomiting.    . ONE TOUCH ULTRA TEST test strip     . polyethylene glycol-electrolytes  (NULYTELY/GOLYTELY) 420 G solution Take by mouth.    . potassium chloride SA (K-DUR,KLOR-CON) 20 MEQ tablet     . predniSONE (DELTASONE) 5 MG tablet Take 5 mg by mouth at bedtime.     . promethazine (PHENERGAN) 25 MG tablet     . saxagliptin HCl (ONGLYZA) 5 MG TABS tablet Take 5 mg by mouth at bedtime.    . sulfamethoxazole-trimethoprim (BACTRIM DS,SEPTRA DS) 800-160 MG per tablet     . Syringe, Disposable, 1 ML MISC 300 Syringes by Does not apply route daily. 300 each 0  . tetracycline (ACHROMYCIN,SUMYCIN) 500 MG capsule Take by mouth.    . Tofacitinib Citrate 5 MG TABS Take 1 tablet by mouth 2 (two) times daily.     Marland Kitchen lamoTRIgine (LAMICTAL) 100 MG tablet Take 1 tablet (100 mg total) by mouth daily. 30 tablet 3  . pregabalin (LYRICA) 50 MG capsule Take 50 mg by mouth 2 (two) times daily.     No current facility-administered medications for this visit.    Medical Decision Making:  Established Problem, Stable/Improving (1), Review and summation of old records (2) and Review of Last Therapy Session (1)  Treatment Plan Summary:Medication management  Discussed with patient about the medications  She  will continue on Lexapro 20 mg in the morning and lamotrigine 100 mg at bedtime  She will continue on Xanax 0.5 mg at bedtime on a when necessary basis.  She will follow-up with her primary care physician Dr. Humphrey Rolls on a regular basis. Follow-up in 3 months or earlier.   More than 50% of the time spent in psychoeducation, counseling and coordination of care.    This note was generated in part or whole with voice recognition software. Voice regonition is usually quite accurate but there are transcription errors that can and very often do occur. I apologize for any typographical errors that were not detected and corrected.   Cinda Quest  Riki Gehring 07/15/2015, 1:55 PM

## 2015-07-25 DIAGNOSIS — G43109 Migraine with aura, not intractable, without status migrainosus: Secondary | ICD-10-CM | POA: Diagnosis not present

## 2015-07-25 DIAGNOSIS — M069 Rheumatoid arthritis, unspecified: Secondary | ICD-10-CM | POA: Diagnosis not present

## 2015-07-25 DIAGNOSIS — M810 Age-related osteoporosis without current pathological fracture: Secondary | ICD-10-CM | POA: Diagnosis not present

## 2015-07-25 DIAGNOSIS — J0191 Acute recurrent sinusitis, unspecified: Secondary | ICD-10-CM | POA: Diagnosis not present

## 2015-07-25 DIAGNOSIS — D519 Vitamin B12 deficiency anemia, unspecified: Secondary | ICD-10-CM | POA: Diagnosis not present

## 2015-08-19 ENCOUNTER — Encounter: Admission: RE | Payer: Self-pay | Source: Ambulatory Visit

## 2015-08-19 ENCOUNTER — Ambulatory Visit: Admission: RE | Admit: 2015-08-19 | Payer: Medicare Other | Source: Ambulatory Visit | Admitting: Gastroenterology

## 2015-08-19 HISTORY — DX: Headache, unspecified: R51.9

## 2015-08-19 HISTORY — DX: Atherosclerotic heart disease of native coronary artery without angina pectoris: I25.10

## 2015-08-19 HISTORY — DX: Other intervertebral disc degeneration, lumbar region: M51.36

## 2015-08-19 HISTORY — DX: Obesity, unspecified: E66.9

## 2015-08-19 HISTORY — DX: Cardiac murmur, unspecified: R01.1

## 2015-08-19 HISTORY — DX: Benign neoplasm of brain, unspecified: D33.2

## 2015-08-19 HISTORY — DX: Unspecified asthma, uncomplicated: J45.909

## 2015-08-19 HISTORY — DX: Pneumonia, unspecified organism: J18.9

## 2015-08-19 HISTORY — DX: Headache: R51

## 2015-08-19 HISTORY — DX: Age-related osteoporosis without current pathological fracture: M81.0

## 2015-08-19 HISTORY — DX: Other intervertebral disc degeneration, lumbar region without mention of lumbar back pain or lower extremity pain: M51.369

## 2015-08-19 SURGERY — COLONOSCOPY WITH PROPOFOL
Anesthesia: General

## 2015-08-30 DIAGNOSIS — M0579 Rheumatoid arthritis with rheumatoid factor of multiple sites without organ or systems involvement: Secondary | ICD-10-CM | POA: Diagnosis not present

## 2015-08-30 DIAGNOSIS — R197 Diarrhea, unspecified: Secondary | ICD-10-CM | POA: Diagnosis not present

## 2015-09-02 DIAGNOSIS — R768 Other specified abnormal immunological findings in serum: Secondary | ICD-10-CM | POA: Diagnosis not present

## 2015-09-02 DIAGNOSIS — R197 Diarrhea, unspecified: Secondary | ICD-10-CM | POA: Diagnosis not present

## 2015-09-06 DIAGNOSIS — G43109 Migraine with aura, not intractable, without status migrainosus: Secondary | ICD-10-CM | POA: Diagnosis not present

## 2015-09-06 DIAGNOSIS — J0191 Acute recurrent sinusitis, unspecified: Secondary | ICD-10-CM | POA: Diagnosis not present

## 2015-09-06 DIAGNOSIS — M064 Inflammatory polyarthropathy: Secondary | ICD-10-CM | POA: Diagnosis not present

## 2015-09-06 DIAGNOSIS — D519 Vitamin B12 deficiency anemia, unspecified: Secondary | ICD-10-CM | POA: Diagnosis not present

## 2015-09-06 DIAGNOSIS — E1165 Type 2 diabetes mellitus with hyperglycemia: Secondary | ICD-10-CM | POA: Diagnosis not present

## 2015-09-06 DIAGNOSIS — J449 Chronic obstructive pulmonary disease, unspecified: Secondary | ICD-10-CM | POA: Diagnosis not present

## 2015-09-08 ENCOUNTER — Emergency Department: Payer: Medicare Other

## 2015-09-08 ENCOUNTER — Encounter: Payer: Self-pay | Admitting: Medical Oncology

## 2015-09-08 ENCOUNTER — Emergency Department
Admission: EM | Admit: 2015-09-08 | Discharge: 2015-09-08 | Disposition: A | Discharge status: Home / Self Care | Payer: Medicare Other | Attending: Emergency Medicine | Admitting: Emergency Medicine

## 2015-09-08 DIAGNOSIS — R110 Nausea: Secondary | ICD-10-CM | POA: Diagnosis not present

## 2015-09-08 DIAGNOSIS — E119 Type 2 diabetes mellitus without complications: Secondary | ICD-10-CM | POA: Insufficient documentation

## 2015-09-08 DIAGNOSIS — Z794 Long term (current) use of insulin: Secondary | ICD-10-CM | POA: Insufficient documentation

## 2015-09-08 DIAGNOSIS — R51 Headache: Secondary | ICD-10-CM | POA: Insufficient documentation

## 2015-09-08 DIAGNOSIS — Z7984 Long term (current) use of oral hypoglycemic drugs: Secondary | ICD-10-CM | POA: Diagnosis not present

## 2015-09-08 DIAGNOSIS — Z7952 Long term (current) use of systemic steroids: Secondary | ICD-10-CM | POA: Insufficient documentation

## 2015-09-08 DIAGNOSIS — F419 Anxiety disorder, unspecified: Secondary | ICD-10-CM | POA: Diagnosis not present

## 2015-09-08 DIAGNOSIS — Z792 Long term (current) use of antibiotics: Secondary | ICD-10-CM | POA: Diagnosis not present

## 2015-09-08 DIAGNOSIS — R519 Headache, unspecified: Secondary | ICD-10-CM

## 2015-09-08 DIAGNOSIS — Z7951 Long term (current) use of inhaled steroids: Secondary | ICD-10-CM | POA: Insufficient documentation

## 2015-09-08 DIAGNOSIS — Z79899 Other long term (current) drug therapy: Secondary | ICD-10-CM | POA: Diagnosis not present

## 2015-09-08 DIAGNOSIS — I10 Essential (primary) hypertension: Secondary | ICD-10-CM | POA: Diagnosis not present

## 2015-09-08 HISTORY — DX: Migraine, unspecified, not intractable, without status migrainosus: G43.909

## 2015-09-08 LAB — CBC
HCT: 36.1 % (ref 35.0–47.0)
HEMOGLOBIN: 12 g/dL (ref 12.0–16.0)
MCH: 26.7 pg (ref 26.0–34.0)
MCHC: 33.4 g/dL (ref 32.0–36.0)
MCV: 79.9 fL — AB (ref 80.0–100.0)
Platelets: 237 10*3/uL (ref 150–440)
RBC: 4.51 MIL/uL (ref 3.80–5.20)
RDW: 17.2 % — ABNORMAL HIGH (ref 11.5–14.5)
WBC: 6.3 10*3/uL (ref 3.6–11.0)

## 2015-09-08 LAB — COMPREHENSIVE METABOLIC PANEL
ALBUMIN: 3.4 g/dL — AB (ref 3.5–5.0)
ALT: 26 U/L (ref 14–54)
ANION GAP: 4 — AB (ref 5–15)
AST: 26 U/L (ref 15–41)
Alkaline Phosphatase: 91 U/L (ref 38–126)
BILIRUBIN TOTAL: 0.7 mg/dL (ref 0.3–1.2)
BUN: 20 mg/dL (ref 6–20)
CALCIUM: 8.8 mg/dL — AB (ref 8.9–10.3)
CHLORIDE: 106 mmol/L (ref 101–111)
CO2: 30 mmol/L (ref 22–32)
Creatinine, Ser: 0.66 mg/dL (ref 0.44–1.00)
GFR calc Af Amer: >60 mL/min (ref >60)
Glucose, Bld: 167 mg/dL — ABNORMAL HIGH (ref 65–99)
Potassium: 3.3 mmol/L — ABNORMAL LOW (ref 3.5–5.1)
Sodium: 140 mmol/L (ref 135–145)
Total Protein: 6.9 g/dL (ref 6.5–8.1)

## 2015-09-08 LAB — SEDIMENTATION RATE: Sed Rate: 45 mm/hr — ABNORMAL HIGH (ref 0–30)

## 2015-09-08 MED ORDER — DIPHENHYDRAMINE HCL 50 MG/ML IJ SOLN
25.0000 mg | Freq: Once | INTRAMUSCULAR | Status: AC
  Administered 2015-09-08: 25 mg via INTRAVENOUS
  Filled 2015-09-08: qty 1

## 2015-09-08 MED ORDER — DEXTROSE 5 % IV SOLN
20.0000 mg | Freq: Once | INTRAVENOUS | Status: AC
  Administered 2015-09-08: 20 mg via INTRAVENOUS
  Filled 2015-09-08 (×2): qty 4

## 2015-09-08 MED ORDER — BUTALBITAL-APAP-CAFFEINE 50-325-40 MG PO TABS: 1.0000 | ORAL_TABLET | Freq: Four times a day (QID) | ORAL | Status: AC | PRN

## 2015-09-08 NOTE — ED Notes (Signed)
NAD noted at time of D/C. Pt denies questions or concerns. Pt ambulatory to the lobby at this time.  Pt refused wheelchair to the lobby at this time.  

## 2015-09-08 NOTE — ED Provider Notes (Signed)
[facility] Emergency Department Provider Note  ____________________________________________    I have reviewed the triage vital signs and the nursing notes.   HISTORY  Chief Complaint Migraine    HPI Meghan [name] is a 70 y.o. female who presents with a generalized throbbing headache for one month. She reports it is always there. She denies neuro deficits. She denies change in vision. She reports occasionally does make her nauseous. Her PCP put her on sumatriptan which temporarily relieves the pain. She has not had any imaging done for her headache. No fevers no chills. No neck pain. She denies jaw claudication/temporal pain     Past Medical History  Diagnosis Date  . COPD (chronic obstructive pulmonary disease) ([place])   . Diabetes mellitus   . Hypertension   . Depression   . Anxiety   . Arthritis     RA  . GERD (gastroesophageal reflux disease)   . Osteopenia   . DDD (degenerative disc disease)   . Obese   . PTSD (post-traumatic stress disorder)   . Asthma   . Coronary artery disease   . Heart murmur   . Brain tumor (benign) ([place])   . Headache   . Obesity   . Lumbar degenerative disc disease   . Osteoporosis   . Pneumonia   . Migraines     Patient Active Problem List   Diagnosis Date Noted  . Degeneration of intervertebral disc of lumbar region 07/15/2015  . Seropositive rheumatoid arthritis ([place]) 07/15/2015  . Asthma with acute exacerbation 03/10/2015  . Hypokalemia 03/01/2015  . Hyponatremia 03/01/2015  . DDD (degenerative disc disease), lumbar 01/31/2015  . Arthritis, degenerative 01/31/2015  . Rheumatoid arthritis with rheumatoid factor ([place]) 01/31/2015  . Diabetes mellitus, type II ([place]) 12/24/2014  . HTN (hypertension) 12/24/2014  . Sepsis ([place]) 12/24/2014  . Left knee pain 12/24/2014  . GERD (gastroesophageal reflux disease) 12/24/2014  . COPD (chronic obstructive pulmonary disease) ([place]) 12/24/2014  . Depression 12/24/2014   . Anxiety 12/24/2014  . Severe bipolar disorder with psychotic features, mood-congruent ([place]) 11/16/2014  . Neurosis, posttraumatic 11/16/2014  . H/O diabetes mellitus 11/16/2014  . H/O: HTN (hypertension) 11/16/2014  . H/O gastric ulcer 11/16/2014  . H/O: obesity 11/16/2014  . H/O arthritis 11/16/2014  . Barton's fracture of distal radius, closed 09/19/2014  . Neuritis or radiculitis due to rupture of lumbar intervertebral disc 05/11/2014  . Cervico-occipital neuralgia 03/26/2014  . Difficulty in walking 03/26/2014  . Cervical spine syndrome 01/26/2014  . Cephalalgia 01/09/2014  . Disordered sleep 01/09/2014  . Diabetes ([place]) 10/19/2013  . BP (high blood pressure) 10/19/2013  . Adiposity 10/19/2013  . Cardiac murmur 10/19/2013  . PNA (pneumonia) 10/19/2013  . Breath shortness 10/19/2013  . Chronic obstructive pulmonary disease ([place]) 10/19/2013  . Diabetes mellitus ([place]) 10/19/2013    Past Surgical History  Procedure Laterality Date  . Hand reconstruction  2011    right-multiple finger joint reconst  . Abdominal hysterectomy    . Cervical fusion    . Joint replacement      bilat knee replacements  . Cholecystectomy    . Foot arthroplasty      toes x2 rt foot  . Finger arthroplasty  02/23/2012    Procedure: FINGER ARTHROPLASTY;  Surgeon: Cammie Sickle., MD;  Location: [place];  Service: Orthopedics;  Laterality: Left;  Extensor carpi radialis longus to Extensor carpi ulnaris transfer, left Metaphalangeal reconstructions of index and long fingers,    Current  Outpatient Rx  Name  Route  Sig  Dispense  Refill  . acyclovir ointment (ZOVIRAX) 5 %   Topical   Apply topically every 3 (three) hours. Use as needed for blistering.   5 g   0   . albuterol (PROVENTIL HFA;VENTOLIN HFA) 108 (90 BASE) MCG/ACT inhaler   Inhalation   Inhale 2 puffs into the lungs every 6 (six) hours as needed for wheezing or shortness of breath.          Marland Kitchen alendronate  (FOSAMAX) 70 MG tablet   Oral   Take 70 mg by mouth once a week. Pt takes on Monday.         . ALPRAZolam (XANAX) 0.5 MG tablet   Oral   Take 1 tablet (0.5 mg total) by mouth at bedtime as needed for anxiety.   30 tablet   2   . azelastine (ASTELIN) 0.1 % nasal spray   Each Nare   Place 1 spray into both nostrils 2 (two) times daily as needed for rhinitis.          Marland Kitchen azithromycin (ZITHROMAX) 250 MG tablet      Take one tablet per day.   4 each   0   . benzonatate (TESSALON) 200 MG capsule   Oral   Take 1 capsule (200 mg total) by mouth 3 (three) times daily as needed for cough.   20 capsule   0   . budesonide-formoterol (SYMBICORT) 80-4.5 MCG/ACT inhaler   Inhalation   Inhale 2 puffs into the lungs 2 (two) times daily.         . Cholecalciferol (VITAMIN D) 2000 UNITS CAPS   Oral   Take 1 capsule by mouth daily.         . cloNIDine (CATAPRES) 0.1 MG tablet   Oral   Take 0.1 mg by mouth 2 (two) times daily.         . diphenhydrAMINE (BENADRYL) 25 MG tablet   Oral   Take 25 mg by mouth every 6 (six) hours as needed for allergies.         Marland Kitchen escitalopram (LEXAPRO) 20 MG tablet   Oral   Take 1 tablet (20 mg total) by mouth every morning.   30 tablet   3   . esomeprazole (NEXIUM) 40 MG capsule   Oral   Take 40 mg by mouth at bedtime.         . fluconazole (DIFLUCAN) 150 MG tablet               . HYDROcodone-acetaminophen (NORCO/VICODIN) 5-325 MG per tablet   Oral   Take 1 tablet by mouth every 4 (four) hours as needed for moderate pain.   12 tablet   0   . insulin glargine (LANTUS) 100 UNIT/ML injection   Subcutaneous   Inject 0.15 mLs (15 Units total) into the skin at bedtime.   10 mL   11   . insulin starter kit- pen needles MISC   Other   1 kit by Other route once.   1 kit   0   . isosorbide mononitrate (IMDUR) 30 MG 24 hr tablet   Oral   Take 30 mg by mouth daily.         Marland Kitchen ketoconazole (NIZORAL) 2 % cream   Topical    Apply 1 application topically at bedtime.         . lamoTRIgine (LAMICTAL) 100 MG tablet   Oral   Take  1 tablet (100 mg total) by mouth daily.   30 tablet   3   . levocetirizine (XYZAL) 5 MG tablet   Oral   Take 5 mg by mouth daily.          . Lidocaine 4 % PTCH   Apply externally   Apply 1 patch topically daily. Remove after 12 hours. No more than 3 patches at a time   15 patch   0   . meloxicam (MOBIC) 7.5 MG tablet   Oral   Take 7.5 mg by mouth daily as needed for pain.          Marland Kitchen nystatin (MYCOSTATIN) 100000 UNIT/ML suspension               . ondansetron (ZOFRAN-ODT) 8 MG disintegrating tablet   Oral   Take 8 mg by mouth 3 (three) times daily as needed for nausea or vomiting.         . ONE TOUCH ULTRA TEST test strip                 Dispense as written.   . polyethylene glycol-electrolytes (NULYTELY/GOLYTELY) 420 G solution   Oral   Take by mouth.         . potassium chloride SA (K-DUR,KLOR-CON) 20 MEQ tablet               . predniSONE (DELTASONE) 5 MG tablet   Oral   Take 5 mg by mouth at bedtime.          . pregabalin (LYRICA) 50 MG capsule   Oral   Take 50 mg by mouth 2 (two) times daily.         . promethazine (PHENERGAN) 25 MG tablet               . saxagliptin HCl (ONGLYZA) 5 MG TABS tablet   Oral   Take 5 mg by mouth at bedtime.         . sulfamethoxazole-trimethoprim (BACTRIM DS,SEPTRA DS) 800-160 MG per tablet               . Syringe, Disposable, 1 ML MISC   Does not apply   300 Syringes by Does not apply route daily.   300 each   0   . Tofacitinib Citrate 5 MG TABS   Oral   Take 1 tablet by mouth 2 (two) times daily.            Allergies Gabapentin; Iodine; Naproxen; Nsaids; and Tramadol  Family History  Problem Relation Age of Onset  . Depression Sister   . CAD    . Hypertension    . Diabetes Mellitus II    . Arthritis    . Hypertension Mother   . Arthritis/Rheumatoid Mother   . Heart  attack Father   . Hypertension Father     Social History Social History  Substance Use Topics  . Smoking status: Never Smoker   . Smokeless tobacco: Never Used  . Alcohol Use: No    Review of Systems  Constitutional: Negative for fever. Eyes: Negative for visual changes. ENT: Negative for sore throat, negative for neck pain Cardiovascular: Negative for chest pain. Respiratory: Negative for  cough Gastrointestinal: Positive for nausea Genitourinary: Negative for dysuria. Musculoskeletal: Negative for back pain.  Skin: Negative for rash. Neurological: Negative for focal weakness Psychiatric: Positive for anxiety    ____________________________________________   PHYSICAL EXAM:  VITAL SIGNS: ED Triage Vitals  Enc Vitals Group  BP 09/08/15 1319 141/64 mmHg     Pulse Rate 09/08/15 1319 90     Resp 09/08/15 1319 20     Temp 09/08/15 1319 98.2 F (36.8 C)     Temp Source 09/08/15 1319 Oral     SpO2 09/08/15 1319 97 %     Weight 09/08/15 1319 155 lb (70.308 kg)     Height 09/08/15 1319 4' 9"  (1.448 m)     Head Cir --      Peak Flow --      Pain Score 09/08/15 1319 9     Pain Loc --      Pain Edu? --      Excl. in [place]? --      Constitutional: Alert and oriented. Well appearing and in no distress. Eyes: Conjunctivae are normal.  ENT   Head: Normocephalic and atraumatic. No temporal tenderness to palpation   Mouth/Throat: Mucous membranes are moist. Cardiovascular: Normal rate, regular rhythm. Normal and symmetric distal pulses are present in all extremities.  Respiratory: Normal respiratory effort without tachypnea nor retractions.  Gastrointestinal: Soft and non-tender in all quadrants. No distention. There is no CVA tenderness. Genitourinary: deferred Musculoskeletal: Nontender with normal range of motion in all extremities. No lower extremity tenderness nor edema. Neurologic:  Normal speech and language. No gross focal neurologic deficits are  appreciated. Cranial nerves II through XII normal Skin:  Skin is warm, dry and intact. No rash noted. Psychiatric: Mood and affect are normal. Patient exhibits appropriate insight and judgment.  ____________________________________________    LABS (pertinent positives/negatives)  Labs Reviewed  SEDIMENTATION RATE  COMPREHENSIVE METABOLIC PANEL  CBC    ____________________________________________   EKG  None  ____________________________________________    RADIOLOGY I have personally reviewed any xrays that were ordered on this patient: CT head unremarkable  ____________________________________________   PROCEDURES  Procedure(s) performed: none  Critical Care performed: none  ____________________________________________   INITIAL IMPRESSION / ASSESSMENT AND PLAN / ED COURSE  Pertinent labs & imaging results that were available during my care of the patient were reviewed by me and considered in my medical decision making (see chart for details).  Patient overall well-appearing and in no distress. Vital signs are unremarkable. Normal neuro exam. We'll treat patient with Reglan and Benadryl IV as well as saline, check labs and an ESR and a CT head and reevaluate  CT is unremarkable. Patient feels better after treatment. I will discharge her with recommendations for outpatient follow-up with her PCP and likely neurology given her one month history of headache  ____________________________________________   FINAL CLINICAL IMPRESSION(S) / ED DIAGNOSES  Final diagnoses:  Acute nonintractable headache, unspecified headache type     Lavonia Drafts, MD 09/08/15 1752

## 2015-09-08 NOTE — Discharge Instructions (Signed)

## 2015-09-08 NOTE — ED Notes (Signed)
Pt reports that she has been having a migraine headache for over a month, reports that she has been using prescribed meds for headache without relief. Pt denies other sx's.

## 2015-09-08 NOTE — ED Notes (Signed)
Patient transported to CT 

## 2015-09-10 ENCOUNTER — Other Ambulatory Visit: Payer: Self-pay | Admitting: Nurse Practitioner

## 2015-09-10 DIAGNOSIS — G44009 Cluster headache syndrome, unspecified, not intractable: Secondary | ICD-10-CM

## 2015-09-19 ENCOUNTER — Encounter: Payer: Self-pay | Admitting: *Deleted

## 2015-09-20 ENCOUNTER — Ambulatory Visit: Payer: Medicare Other | Admitting: Anesthesiology

## 2015-09-20 ENCOUNTER — Encounter
Admission: RE | Disposition: A | Discharge status: Home / Self Care | Payer: Self-pay | Source: Ambulatory Visit | Attending: Gastroenterology

## 2015-09-20 ENCOUNTER — Ambulatory Visit
Admission: RE | Admit: 2015-09-20 | Discharge: 2015-09-20 | Disposition: A | Discharge status: Home / Self Care | Payer: Medicare Other | Source: Ambulatory Visit | Attending: Gastroenterology | Admitting: Gastroenterology

## 2015-09-20 ENCOUNTER — Encounter: Payer: Self-pay | Admitting: *Deleted

## 2015-09-20 DIAGNOSIS — M069 Rheumatoid arthritis, unspecified: Secondary | ICD-10-CM | POA: Insufficient documentation

## 2015-09-20 DIAGNOSIS — J45909 Unspecified asthma, uncomplicated: Secondary | ICD-10-CM | POA: Diagnosis not present

## 2015-09-20 DIAGNOSIS — M1990 Unspecified osteoarthritis, unspecified site: Secondary | ICD-10-CM | POA: Insufficient documentation

## 2015-09-20 DIAGNOSIS — F419 Anxiety disorder, unspecified: Secondary | ICD-10-CM | POA: Diagnosis not present

## 2015-09-20 DIAGNOSIS — K5900 Constipation, unspecified: Secondary | ICD-10-CM | POA: Insufficient documentation

## 2015-09-20 DIAGNOSIS — F329 Major depressive disorder, single episode, unspecified: Secondary | ICD-10-CM | POA: Insufficient documentation

## 2015-09-20 DIAGNOSIS — R1030 Lower abdominal pain, unspecified: Secondary | ICD-10-CM | POA: Diagnosis not present

## 2015-09-20 DIAGNOSIS — G709 Myoneural disorder, unspecified: Secondary | ICD-10-CM | POA: Diagnosis not present

## 2015-09-20 DIAGNOSIS — Z794 Long term (current) use of insulin: Secondary | ICD-10-CM | POA: Insufficient documentation

## 2015-09-20 DIAGNOSIS — E669 Obesity, unspecified: Secondary | ICD-10-CM | POA: Insufficient documentation

## 2015-09-20 DIAGNOSIS — I10 Essential (primary) hypertension: Secondary | ICD-10-CM | POA: Insufficient documentation

## 2015-09-20 DIAGNOSIS — J449 Chronic obstructive pulmonary disease, unspecified: Secondary | ICD-10-CM | POA: Diagnosis not present

## 2015-09-20 DIAGNOSIS — E7800 Pure hypercholesterolemia, unspecified: Secondary | ICD-10-CM | POA: Insufficient documentation

## 2015-09-20 DIAGNOSIS — M810 Age-related osteoporosis without current pathological fracture: Secondary | ICD-10-CM | POA: Diagnosis not present

## 2015-09-20 DIAGNOSIS — E119 Type 2 diabetes mellitus without complications: Secondary | ICD-10-CM | POA: Diagnosis not present

## 2015-09-20 DIAGNOSIS — Z86011 Personal history of benign neoplasm of the brain: Secondary | ICD-10-CM | POA: Insufficient documentation

## 2015-09-20 DIAGNOSIS — K640 First degree hemorrhoids: Secondary | ICD-10-CM | POA: Insufficient documentation

## 2015-09-20 DIAGNOSIS — K219 Gastro-esophageal reflux disease without esophagitis: Secondary | ICD-10-CM | POA: Diagnosis not present

## 2015-09-20 DIAGNOSIS — G43909 Migraine, unspecified, not intractable, without status migrainosus: Secondary | ICD-10-CM | POA: Insufficient documentation

## 2015-09-20 DIAGNOSIS — R933 Abnormal findings on diagnostic imaging of other parts of digestive tract: Secondary | ICD-10-CM | POA: Diagnosis not present

## 2015-09-20 DIAGNOSIS — I252 Old myocardial infarction: Secondary | ICD-10-CM | POA: Diagnosis not present

## 2015-09-20 DIAGNOSIS — F4310 Post-traumatic stress disorder, unspecified: Secondary | ICD-10-CM | POA: Insufficient documentation

## 2015-09-20 DIAGNOSIS — I2510 Atherosclerotic heart disease of native coronary artery without angina pectoris: Secondary | ICD-10-CM | POA: Insufficient documentation

## 2015-09-20 DIAGNOSIS — Z96653 Presence of artificial knee joint, bilateral: Secondary | ICD-10-CM | POA: Diagnosis not present

## 2015-09-20 DIAGNOSIS — R109 Unspecified abdominal pain: Secondary | ICD-10-CM | POA: Diagnosis not present

## 2015-09-20 DIAGNOSIS — K648 Other hemorrhoids: Secondary | ICD-10-CM | POA: Diagnosis not present

## 2015-09-20 HISTORY — DX: Angina pectoris, unspecified: I20.9

## 2015-09-20 HISTORY — PX: COLONOSCOPY WITH PROPOFOL: SHX5780

## 2015-09-20 HISTORY — DX: Pure hypercholesterolemia, unspecified: E78.00

## 2015-09-20 LAB — GLUCOSE, CAPILLARY: Glucose-Capillary: 142 mg/dL — ABNORMAL HIGH (ref 65–99)

## 2015-09-20 SURGERY — COLONOSCOPY WITH PROPOFOL
Anesthesia: General

## 2015-09-20 MED ORDER — MIDAZOLAM HCL 5 MG/5ML IJ SOLN
INTRAMUSCULAR | Status: DC | PRN
  Administered 2015-09-20: 1 mg via INTRAVENOUS

## 2015-09-20 MED ORDER — FENTANYL CITRATE (PF) 100 MCG/2ML IJ SOLN
INTRAMUSCULAR | Status: DC | PRN
  Administered 2015-09-20: 1 ug via INTRAVENOUS

## 2015-09-20 MED ORDER — ONDANSETRON HCL 4 MG/2ML IJ SOLN
4.0000 mg | INTRAMUSCULAR | Status: AC
  Administered 2015-09-20: 4 mg via INTRAVENOUS

## 2015-09-20 MED ORDER — ACETAMINOPHEN 500 MG PO TABS
ORAL_TABLET | ORAL | Status: AC
  Filled 2015-09-20: qty 2

## 2015-09-20 MED ORDER — PROPOFOL 10 MG/ML IV BOLUS
INTRAVENOUS | Status: DC | PRN
  Administered 2015-09-20: 35 mg via INTRAVENOUS

## 2015-09-20 MED ORDER — PROPOFOL 500 MG/50ML IV EMUL
INTRAVENOUS | Status: DC | PRN
  Administered 2015-09-20: 100 ug/kg/min via INTRAVENOUS

## 2015-09-20 MED ORDER — ACETAMINOPHEN 325 MG PO TABS: 650.0000 mg | ORAL_TABLET | Freq: Four times a day (QID) | ORAL | Status: DC | PRN

## 2015-09-20 MED ORDER — ONDANSETRON HCL 4 MG/2ML IJ SOLN
INTRAMUSCULAR | Status: AC
  Administered 2015-09-20: 4 mg via INTRAVENOUS
  Filled 2015-09-20: qty 2

## 2015-09-20 MED ORDER — SODIUM CHLORIDE 0.9 % IV SOLN
INTRAVENOUS | Status: DC
  Administered 2015-09-20: 09:00:00 via INTRAVENOUS

## 2015-09-20 NOTE — Op Note (Signed)
[facility] Gastroenterology [name] Procedure Date: 09/20/2015 8:55 AM MRN: [id number] Account #: [id number] Date of Birth: November 09, 1945 Admit Type: Outpatient Age: 70 Room: [facility] ENDO ROOM 2 Gender: Female Note Status: Finalized Procedure:         Colonoscopy Indications:       Lower abdominal pain, Abnormal CT of the GI tract( soft                     tissue prominence in cecum), Constipation Providers:         Gerrit Heck. Rayann Heman, MD Referring MD:      Lavera Guise, MD (Referring MD) Medicines:         Propofol per Anesthesia Complications:     No immediate complications. Procedure:         Pre-Anesthesia Assessment:                    - Prior to the procedure, a History and Physical was                     performed, and patient medications, allergies and                     sensitivities were reviewed. The patient's tolerance of                     previous anesthesia was reviewed.                    After obtaining informed consent, the colonoscope was                     passed under direct vision. Throughout the procedure, the                     patient's blood pressure, pulse, and oxygen saturations                     were monitored continuously. The Colonoscope was                     introduced through the anus and advanced to the the cecum,                     identified by appendiceal orifice and ileocecal valve. The                     colonoscopy was performed without difficulty. The patient                     tolerated the procedure well. The quality of the bowel                     preparation was fair except the HF, ascending, and cecum                     were poor. Findings:      The perianal and digital rectal examinations were normal.      Internal hemorrhoids were found during retroflexion. The hemorrhoids       were Grade I (internal hemorrhoids that do not prolapse).      A large amount of solid stool was found at the  hepatic flexure, in the       ascending colon and in the cecum, precluding  visualization.      - Cannot rule out mass lesion in right colon Impression:        - Internal hemorrhoids.                    - Stool at the hepatic flexure, in the ascending colon and                     in the cecum.                    - No specimens collected. Recommendation:    - Observe patient in GI recovery unit.                    - High fiber diet.                    - Continue present medications.                    - Repeat colonoscopy at the next available appointment                     because the bowel preparation was poor. Use TWO DAY prep.                    - Return to GI clinic.                    - The findings and recommendations were discussed with the                     patient.                    - The findings and recommendations were discussed with the                     patient's family. Procedure Code(s): --- Professional ---                    [phone], Colonoscopy, flexible; diagnostic, including                     collection of specimen(s) by brushing or washing, when                     performed (separate procedure) Diagnosis Code(s): --- Professional ---                    K64.0, First degree hemorrhoids                    R10.30, Lower abdominal pain, unspecified                    K59.00, Constipation, unspecified                    R93.3, Abnormal findings on diagnostic imaging of other                     parts of digestive tract CPT copyright 2014 [employer]. All rights reserved. The codes documented in this report are preliminary and upon coder review may  be revised to meet current compliance requirements. Mellody Life, MD 09/20/2015 9:22:07 AM This report has been signed electronically. Number of Addenda: 0 Note Initiated On: 09/20/2015 8:55 AM Scope Withdrawal Time:  0 hours 4 minutes 31 seconds  Total Procedure Duration: 0 hours 7 minutes 57  seconds       [facility]

## 2015-09-20 NOTE — Anesthesia Preprocedure Evaluation (Signed)
Anesthesia Evaluation  Patient identified by MRN, date of birth, ID band Patient awake    Reviewed: Allergy & Precautions, H&P , NPO status , Patient's Chart, lab work & pertinent test results, reviewed documented beta blocker date and time   Airway Mallampati: II   Neck ROM: full    Dental  (+) Poor Dentition   Pulmonary neg pulmonary ROS, shortness of breath, asthma , pneumonia, COPD,    Pulmonary exam normal        Cardiovascular Exercise Tolerance: Poor hypertension, (-) angina+ CAD and + Past MI  negative cardio ROS Normal cardiovascular exam+ Valvular Problems/Murmurs   No recent angina.  Stable cardiac status and compliant with medications. JA   Neuro/Psych  Headaches, PSYCHIATRIC DISORDERS  Neuromuscular disease negative neurological ROS  negative psych ROS   GI/Hepatic negative GI ROS, Neg liver ROS, GERD  ,  Endo/Other  negative endocrine ROSdiabetes  Renal/GU negative Renal ROS  negative genitourinary   Musculoskeletal   Abdominal   Peds  Hematology negative hematology ROS (+)   Anesthesia Other Findings Past Medical History:   COPD (chronic obstructive pulmonary disease) (*              Diabetes mellitus                                            Hypertension                                                 Depression                                                   Anxiety                                                      Arthritis                                                      Comment:RA   GERD (gastroesophageal reflux disease)                       Osteopenia                                                   DDD (degenerative disc disease)                              Obese  PTSD (post-traumatic stress disorder)                        Asthma                                                       Coronary artery disease                                       Heart murmur                                                 Brain tumor (benign) (HCC)                                   Headache                                                     Obesity                                                      Lumbar degenerative disc disease                             Osteoporosis                                                 Pneumonia                                                    Migraines                                                    Anginal pain (HCC)                                           Heart murmur                                                 Hypercholesteremia  Past Surgical History:   HAND RECONSTRUCTION                              2011           Comment:right-multiple finger joint reconst   ABDOMINAL HYSTERECTOMY                                        CERVICAL FUSION                                               JOINT REPLACEMENT                                               Comment:bilat knee replacements   CHOLECYSTECTOMY                                               FOOT ARTHROPLASTY                                               Comment:toes x2 rt foot   FINGER ARTHROPLASTY                              02/23/2012      Comment:Procedure: FINGER ARTHROPLASTY;  Surgeon:               Cammie Sickle., MD;  Location: [place];  Service: Orthopedics;                Laterality: Left;  Extensor carpi radialis               longus to Extensor carpi ulnaris transfer, left              Metaphalangeal reconstructions of index and               long fingers,   Reproductive/Obstetrics                             Anesthesia Physical Anesthesia Plan  ASA: III  Anesthesia Plan: General   Post-op Pain Management:    Induction:   Airway Management Planned:   Additional Equipment:   Intra-op Plan:    Post-operative Plan:   Informed Consent: I have reviewed the patients History and Physical, chart, labs and discussed the procedure including the risks, benefits and alternatives for the proposed anesthesia with the patient or authorized representative who has indicated his/her understanding and acceptance.   Dental Advisory Given  Plan Discussed with: CRNA  Anesthesia Plan Comments:         Anesthesia Quick Evaluation

## 2015-09-20 NOTE — Transfer of Care (Signed)
Immediate Anesthesia Transfer of Care Note  Patient: Meghan [name]  Procedure(s) Performed: Procedure(s): COLONOSCOPY WITH PROPOFOL (N/A)  Patient Location: PACU and Endoscopy Unit  Anesthesia Type:General  Level of Consciousness: awake, alert  and oriented  Airway & Oxygen Therapy: Patient Spontanous Breathing and Patient connected to nasal cannula oxygen  Post-op Assessment: Report given to RN and Post -op Vital signs reviewed and stable  Post vital signs: Reviewed and stable  Last Vitals:  Filed Vitals:   09/20/15 0816 09/20/15 0923  BP: 158/82   Pulse: 115   Temp: 37.3 C 36.2 C  Resp: 16     Complications: No apparent anesthesia complications

## 2015-09-20 NOTE — Anesthesia Postprocedure Evaluation (Signed)
Anesthesia Post Note  Patient: Meghan [name]  Procedure(s) Performed: Procedure(s) (LRB): COLONOSCOPY WITH PROPOFOL (N/A)  Patient location during evaluation: PACU Anesthesia Type: General Level of consciousness: awake and alert Pain management: pain level controlled Vital Signs Assessment: post-procedure vital signs reviewed and stable Respiratory status: spontaneous breathing, nonlabored ventilation, respiratory function stable and patient connected to nasal cannula oxygen Cardiovascular status: blood pressure returned to baseline and stable Postop Assessment: no signs of nausea or vomiting Anesthetic complications: no    Last Vitals:  Filed Vitals:   09/20/15 0816  BP: 158/82  Pulse: 115  Temp: 37.3 C  Resp: 16    Last Pain:  Filed Vitals:   09/20/15 0821  PainSc: [name]

## 2015-09-20 NOTE — Anesthesia Procedure Notes (Signed)
Date/Time: 09/20/2015 9:06 AM Performed by: Kennon Holter Pre-anesthesia Checklist: Timeout performed, Patient being monitored, Suction available, Emergency Drugs available and Patient identified Patient Re-evaluated:Patient Re-evaluated prior to inductionOxygen Delivery Method: Nasal cannula Preoxygenation: Pre-oxygenation with 100% oxygen Intubation Type: IV induction Placement Confirmation: positive ETCO2

## 2015-09-20 NOTE — Discharge Instructions (Signed)

## 2015-09-20 NOTE — H&P (Signed)
Primary Care Physician:  Lavera Guise, MD  Pre-Procedure History & Physical: HPI:  Meghan [name] is a 70 y.o. female is here for an colonoscopy.   Past Medical History  Diagnosis Date  . COPD (chronic obstructive pulmonary disease) ([place])   . Diabetes mellitus   . Hypertension   . Depression   . Anxiety   . Arthritis     RA  . GERD (gastroesophageal reflux disease)   . Osteopenia   . DDD (degenerative disc disease)   . Obese   . PTSD (post-traumatic stress disorder)   . Asthma   . Coronary artery disease   . Heart murmur   . Brain tumor (benign) ([place])   . Headache   . Obesity   . Lumbar degenerative disc disease   . Osteoporosis   . Pneumonia   . Migraines   . Anginal pain ([place])   . Heart murmur   . Hypercholesteremia     Past Surgical History  Procedure Laterality Date  . Hand reconstruction  2011    right-multiple finger joint reconst  . Abdominal hysterectomy    . Cervical fusion    . Joint replacement      bilat knee replacements  . Cholecystectomy    . Foot arthroplasty      toes x2 rt foot  . Finger arthroplasty  02/23/2012    Procedure: FINGER ARTHROPLASTY;  Surgeon: Cammie Sickle., MD;  Location: [facility];  Service: Orthopedics;  Laterality: Left;  Extensor carpi radialis longus to Extensor carpi ulnaris transfer, left Metaphalangeal reconstructions of index and long fingers,    Prior to Admission medications   Medication Sig Start Date End Date Taking? Authorizing Provider  albuterol (PROVENTIL HFA;VENTOLIN HFA) 108 (90 BASE) MCG/ACT inhaler Inhale 2 puffs into the lungs every 6 (six) hours as needed for wheezing or shortness of breath.    Yes Historical Provider, MD  cloNIDine (CATAPRES) 0.1 MG tablet Take 0.1 mg by mouth 2 (two) times daily.   Yes Historical Provider, MD  isosorbide mononitrate (IMDUR) 30 MG 24 hr tablet Take 30 mg by mouth daily.   Yes Historical Provider, MD  predniSONE (DELTASONE) 5 MG tablet Take 5 mg by mouth  at bedtime.    Yes Historical Provider, MD  pregabalin (LYRICA) 50 MG capsule Take 50 mg by mouth 2 (two) times daily.   Yes Historical Provider, MD  acyclovir ointment (ZOVIRAX) 5 % Apply topically every 3 (three) hours. Use as needed for blistering. 03/09/15   Aldean Jewett, MD  alendronate (FOSAMAX) 70 MG tablet Take 70 mg by mouth once a week. Pt takes on Monday.    Historical Provider, MD  ALPRAZolam Duanne Moron) 0.5 MG tablet Take 1 tablet (0.5 mg total) by mouth at bedtime as needed for anxiety. 07/15/15   Rainey Pines, MD  azelastine (ASTELIN) 0.1 % nasal spray Place 1 spray into both nostrils 2 (two) times daily as needed for rhinitis.     Historical Provider, MD  azithromycin (ZITHROMAX) 250 MG tablet Take one tablet per day. 03/09/15   Aldean Jewett, MD  benzonatate (TESSALON) 200 MG capsule Take 1 capsule (200 mg total) by mouth 3 (three) times daily as needed for cough. 03/09/15   Aldean Jewett, MD  budesonide-formoterol [facility]) 80-4.5 MCG/ACT inhaler Inhale 2 puffs into the lungs 2 (two) times daily.    Historical Provider, MD  butalbital-acetaminophen-caffeine (FIORICET) 50-325-40 MG tablet Take 1-2 tablets by mouth every 6 (six) hours  as needed for headache. 09/08/15 09/07/16  Lavonia Drafts, MD  Cholecalciferol (VITAMIN D) 2000 UNITS CAPS Take 1 capsule by mouth daily.    Historical Provider, MD  diphenhydrAMINE (BENADRYL) 25 MG tablet Take 25 mg by mouth every 6 (six) hours as needed for allergies.    Historical Provider, MD  escitalopram (LEXAPRO) 20 MG tablet Take 1 tablet (20 mg total) by mouth every morning. 07/15/15   Rainey Pines, MD  esomeprazole (NEXIUM) 40 MG capsule Take 40 mg by mouth at bedtime.    Historical Provider, MD  fluconazole (DIFLUCAN) 150 MG tablet  02/19/15   Historical Provider, MD  HYDROcodone-acetaminophen (NORCO/VICODIN) 5-325 MG per tablet Take 1 tablet by mouth every 4 (four) hours as needed for moderate pain. 05/04/15   Victorino Dike, FNP  insulin  glargine (LANTUS) 100 UNIT/ML injection Inject 0.15 mLs (15 Units total) into the skin at bedtime. 03/10/15   Aldean Jewett, MD  insulin starter kit- pen needles MISC 1 kit by Other route once. 03/09/15   Aldean Jewett, MD  ketoconazole (NIZORAL) 2 % cream Apply 1 application topically at bedtime.    Historical Provider, MD  lamoTRIgine (LAMICTAL) 100 MG tablet Take 1 tablet (100 mg total) by mouth daily. 07/15/15   Rainey Pines, MD  levocetirizine (XYZAL) 5 MG tablet Take 5 mg by mouth daily.     Historical Provider, MD  Lidocaine 4 % PTCH Apply 1 patch topically daily. Remove after 12 hours. No more than 3 patches at a time 05/04/15   Victorino Dike, FNP  meloxicam (MOBIC) 7.5 MG tablet Take 7.5 mg by mouth daily as needed for pain.     Historical Provider, MD  nystatin (MYCOSTATIN) 100000 UNIT/ML suspension  02/19/15   Historical Provider, MD  ondansetron (ZOFRAN-ODT) 8 MG disintegrating tablet Take 8 mg by mouth 3 (three) times daily as needed for nausea or vomiting.    Historical Provider, MD  ONE TOUCH ULTRA TEST test strip  02/19/15   Historical Provider, MD  polyethylene glycol-electrolytes (NULYTELY/GOLYTELY) 420 G solution Take by mouth. 07/08/15   Historical Provider, MD  potassium chloride SA (K-DUR,KLOR-CON) 20 MEQ tablet  03/18/15   Historical Provider, MD  promethazine (PHENERGAN) 25 MG tablet  04/04/15   Historical Provider, MD  saxagliptin HCl (ONGLYZA) 5 MG TABS tablet Take 5 mg by mouth at bedtime.    Historical Provider, MD  sulfamethoxazole-trimethoprim (BACTRIM DS,SEPTRA DS) 800-160 MG per tablet  03/29/15   Historical Provider, MD  Syringe, Disposable, 1 ML MISC 300 Syringes by Does not apply route daily. 03/11/15   Aldean Jewett, MD  Tofacitinib Citrate 5 MG TABS Take 1 tablet by mouth 2 (two) times daily.     Historical Provider, MD    Allergies as of 08/27/2015 - Review Complete 08/16/2015  Allergen Reaction Noted  . Gabapentin Other (See Comments) 11/16/2014  . Iodine  Other (See Comments) 01/26/2012  . Naproxen Hives 01/26/2012  . Nsaids Other (See Comments) 11/16/2014  . Tramadol Itching 05/04/2015    Family History  Problem Relation Age of Onset  . Depression Sister   . CAD    . Hypertension    . Diabetes Mellitus II    . Arthritis    . Hypertension Mother   . Arthritis/Rheumatoid Mother   . Heart attack Father   . Hypertension Father     Social History   Social History  . Marital Status: Divorced    Spouse Name: N/A  . Number  of Children: N/A  . Years of Education: N/A   Occupational History  . Not on file.   Social History Main Topics  . Smoking status: Never Smoker   . Smokeless tobacco: Never Used  . Alcohol Use: No  . Drug Use: No  . Sexual Activity: No   Other Topics Concern  . Not on file   Social History Narrative     Physical Exam: BP 158/82 mmHg  Pulse 115  Temp(Src) 99.1 F (37.3 C) (Tympanic)  Resp 16  SpO2 99% General:   Alert,  pleasant and cooperative in NAD Head:  Normocephalic and atraumatic. Neck:  Supple; no masses or thyromegaly. Lungs:  Clear throughout to auscultation.    Heart:  Regular rate and rhythm. Abdomen:  Soft, nontender and nondistended. Normal bowel sounds, without guarding, and without rebound.   Neurologic:  Alert and  oriented x4;  grossly normal neurologically.  Impression/Plan: Virgie Dad is here for an colonoscopy to be performed for abnormal CT scan, abd pain, constipation  Risks, benefits, limitations, and alternatives regarding  colonoscopy have been reviewed with the patient.  Questions have been answered.  All parties agreeable.   Josefine Class, MD  09/20/2015, 8:49 AM

## 2015-09-21 ENCOUNTER — Encounter: Payer: Self-pay | Admitting: Gastroenterology

## 2015-09-27 ENCOUNTER — Ambulatory Visit: Admission: RE | Admit: 2015-09-27 | Payer: Medicare Other | Source: Ambulatory Visit

## 2015-10-02 DIAGNOSIS — D481 Neoplasm of uncertain behavior of connective and other soft tissue: Secondary | ICD-10-CM | POA: Diagnosis not present

## 2015-10-10 ENCOUNTER — Ambulatory Visit: Payer: 59 | Admitting: Psychiatry

## 2015-10-16 ENCOUNTER — Encounter: Payer: Self-pay | Admitting: Psychiatry

## 2015-10-16 ENCOUNTER — Ambulatory Visit (INDEPENDENT_AMBULATORY_CARE_PROVIDER_SITE_OTHER): Payer: 59 | Admitting: Psychiatry

## 2015-10-16 VITALS — BP 122/78 | HR 100 | Temp 98.1°F | Ht <= 58 in | Wt 152.2 lb

## 2015-10-16 DIAGNOSIS — F411 Generalized anxiety disorder: Secondary | ICD-10-CM

## 2015-10-16 DIAGNOSIS — F3160 Bipolar disorder, current episode mixed, unspecified: Secondary | ICD-10-CM

## 2015-10-16 MED ORDER — ESCITALOPRAM OXALATE 20 MG PO TABS: 20.0000 mg | ORAL_TABLET | ORAL | Status: DC

## 2015-10-16 MED ORDER — LAMOTRIGINE 100 MG PO TABS: 100.0000 mg | ORAL_TABLET | Freq: Every day | ORAL | Status: DC

## 2015-10-16 MED ORDER — ALPRAZOLAM 0.5 MG PO TABS: 0.7500 mg | ORAL_TABLET | Freq: Every evening | ORAL | Status: DC | PRN

## 2015-10-16 NOTE — Progress Notes (Signed)
BH MD/PA/NP OP Progress Note  10/16/2015 3:02 PM Meghan [name]  MRN:  [id number]  Subjective:   Meghan [name]  is a 70 year old female who presented for the follow-up appointment. She reported that she is feeling sad as to her close friends past away after being in the hospice for almost 2 months. She reported that now her daughter-in-law's father is in the hospice after suffering a stroke. She reported that she is concerned about the people dying close to her. We discussed about the life and death during this interview. She reported that she has been talking to her family about the life and death as well. She reported that she has been compliant with her medications. She currently denied having any adverse effects to her medications. She reported that she has been taking 1-1/2 pill of Xanax to control her anxiety and feels stable. She does not have any adverse effects and her anxiety and memory are good. She appeared apprehensive during the interview. She has been living with her family and has good relationship with them. She currently denied having any suicidal ideations or plans. She spends time with her friends and family. She reported that she does not have any perceptual disturbances she denied having any suicidal homicidal ideations or plans.   Chief Complaint:  Chief Complaint    Follow-up; Medication Refill     Visit Diagnosis:     ICD-9-CM ICD-10-CM   1. Bipolar I disorder, most recent episode mixed ([place]) 296.60 F31.60   2. Anxiety state 300.00 F41.1     Past Medical History:  Past Medical History  Diagnosis Date  . COPD (chronic obstructive pulmonary disease) ([place])   . Diabetes mellitus   . Hypertension   . Depression   . Anxiety   . Arthritis     RA  . GERD (gastroesophageal reflux disease)   . Osteopenia   . DDD (degenerative disc disease)   . Obese   . PTSD (post-traumatic stress disorder)   . Asthma   . Coronary artery disease   . Heart murmur   . Brain tumor (benign) ([place])    . Headache   . Obesity   . Lumbar degenerative disc disease   . Osteoporosis   . Pneumonia   . Migraines   . Anginal pain ([place])   . Heart murmur   . Hypercholesteremia     Past Surgical History  Procedure Laterality Date  . Hand reconstruction  2011    right-multiple finger joint reconst  . Abdominal hysterectomy    . Cervical fusion    . Joint replacement      bilat knee replacements  . Cholecystectomy    . Foot arthroplasty      toes x2 rt foot  . Finger arthroplasty  02/23/2012    Procedure: FINGER ARTHROPLASTY;  Surgeon: Cammie Sickle., MD;  Location: [place];  Service: Orthopedics;  Laterality: Left;  Extensor carpi radialis longus to Extensor carpi ulnaris transfer, left Metaphalangeal reconstructions of index and long fingers,  . Colonoscopy with propofol N/A 09/20/2015    Procedure: COLONOSCOPY WITH PROPOFOL;  Surgeon: Josefine Class, MD;  Location: [facility] END[facility]COPY;  Service: Endoscopy;  Laterality: N/A;   Family History:  Family History  Problem Relation Age of Onset  . Depression Sister   . CAD    . Hypertension    . Diabetes Mellitus II    . Arthritis    . Hypertension Mother   . Arthritis/Rheumatoid Mother   .  Heart attack Father   . Hypertension Father    Social History:  Social History   Social History  . Marital Status: Divorced    Spouse Name: N/A  . Number of Children: N/A  . Years of Education: N/A   Social History Main Topics  . Smoking status: Never Smoker   . Smokeless tobacco: Never Used  . Alcohol Use: No  . Drug Use: No  . Sexual Activity: No   Other Topics Concern  . None   Social History Narrative   Additional History:  Patient currently lives with her family members   Assessment:   Musculoskeletal: Strength & Muscle Tone: within normal limits Gait & Station: normal Patient leans: N/A  Psychiatric Specialty Exam: Anxiety Symptoms include dizziness and nervous/anxious behavior. Patient reports  no nausea or palpitations.      Review of Systems  Constitutional: Negative for chills.  HENT: Negative for hearing loss.   Eyes: Negative for photophobia.  Respiratory: Negative for sputum production.   Cardiovascular: Negative for palpitations.  Gastrointestinal: Negative for nausea.  Genitourinary: Negative for frequency.  Musculoskeletal: Positive for back pain.  Skin: Negative for rash.  Neurological: Positive for dizziness. Negative for tingling.  Endo/Heme/Allergies: Negative for environmental allergies.  Psychiatric/Behavioral: Positive for depression. Negative for hallucinations. The patient is nervous/anxious.   All other systems reviewed and are negative.   Blood pressure 122/78, pulse 100, temperature 98.1 F (36.7 C), temperature source Tympanic, height _0  (1.448 m), weight 152 lb 3.2 oz (69.037 kg), SpO2 95 %.Body mass index is 32.93 kg/(m^2).  General Appearance: Casual  Eye Contact:  Fair  Speech:  Clear and Coherent  Volume:  Normal  Mood:  Anxious  Affect:  Congruent  Thought Process:  Goal Directed  Orientation:  Full (Time, Place, and Person)  Thought Content:  WDL  Suicidal Thoughts:  No  Homicidal Thoughts:  No  Memory:  Immediate;   Fair  Judgement:  Fair  Insight:  Fair  Psychomotor Activity:  Normal  Concentration:  Fair  Recall:  [employer] of Knowledge: Fair  Language: Fair  Akathisia:  No  Handed:  Right  AIMS (if indicated):  none  Assets:  Communication Skills Desire for Improvement Physical Health  ADL's:  Intact  Cognition: WNL  Sleep:  6-7    Is the patient at risk to self?  No. Has the patient been a risk to self in the past 6 months?  No. Has the patient been a risk to self within the distant past?  No. Is the patient a risk to others?  No. Has the patient been a risk to others in the past 6 months?  No. Has the patient been a risk to others within the distant past?  No.  Current Medications: Current Outpatient  Prescriptions  Medication Sig Dispense Refill  . acyclovir ointment (ZOVIRAX) 5 % Apply topically every 3 (three) hours. Use as needed for blistering. 5 g 0  . albuterol (PROVENTIL HFA;VENTOLIN HFA) 108 (90 BASE) MCG/ACT inhaler Inhale 2 puffs into the lungs every 6 (six) hours as needed for wheezing or shortness of breath.     Marland Kitchen alendronate (FOSAMAX) 70 MG tablet Take 70 mg by mouth once a week. Pt takes on Monday.    . ALPRAZolam (XANAX) 0.5 MG tablet Take 1 tablet (0.5 mg total) by mouth at bedtime as needed for anxiety. 30 tablet 2  . azelastine (ASTELIN) 0.1 % nasal spray Place 1 spray into both nostrils 2 (two)  times daily as needed for rhinitis.     Marland Kitchen azithromycin (ZITHROMAX) 250 MG tablet Take one tablet per day. 4 each 0  . benzonatate (TESSALON) 200 MG capsule Take 1 capsule (200 mg total) by mouth 3 (three) times daily as needed for cough. 20 capsule 0  . budesonide-formoterol (SYMBICORT) 80-4.5 MCG/ACT inhaler Inhale 2 puffs into the lungs 2 (two) times daily.    . butalbital-acetaminophen-caffeine (FIORICET) 50-325-40 MG tablet Take 1-2 tablets by mouth every 6 (six) hours as needed for headache. 20 tablet 0  . Cholecalciferol (VITAMIN D) 2000 UNITS CAPS Take 1 capsule by mouth daily.    . cloNIDine (CATAPRES) 0.1 MG tablet Take 0.1 mg by mouth 2 (two) times daily.    . diphenhydrAMINE (BENADRYL) 25 MG tablet Take 25 mg by mouth every 6 (six) hours as needed for allergies.    Marland Kitchen escitalopram (LEXAPRO) 20 MG tablet Take 1 tablet (20 mg total) by mouth every morning. 30 tablet 3  . esomeprazole (NEXIUM) 40 MG capsule Take 40 mg by mouth at bedtime.    . fluconazole (DIFLUCAN) 150 MG tablet     . HYDROcodone-acetaminophen (NORCO/VICODIN) 5-325 MG per tablet Take 1 tablet by mouth every 4 (four) hours as needed for moderate pain. 12 tablet 0  . insulin glargine (LANTUS) 100 UNIT/ML injection Inject 0.15 mLs (15 Units total) into the skin at bedtime. 10 mL 11  . insulin starter kit- pen  needles MISC 1 kit by Other route once. 1 kit 0  . isosorbide mononitrate (IMDUR) 30 MG 24 hr tablet Take 30 mg by mouth daily.    Marland Kitchen ketoconazole (NIZORAL) 2 % cream Apply 1 application topically at bedtime.    . lamoTRIgine (LAMICTAL) 100 MG tablet Take 1 tablet (100 mg total) by mouth daily. 30 tablet 3  . levocetirizine (XYZAL) 5 MG tablet Take 5 mg by mouth daily.     . Lidocaine 4 % PTCH Apply 1 patch topically daily. Remove after 12 hours. No more than 3 patches at a time 15 patch 0  . meloxicam (MOBIC) 7.5 MG tablet Take 7.5 mg by mouth daily as needed for pain.     Marland Kitchen nystatin (MYCOSTATIN) 100000 UNIT/ML suspension     . ondansetron (ZOFRAN-ODT) 8 MG disintegrating tablet Take 8 mg by mouth 3 (three) times daily as needed for nausea or vomiting.    . ONE TOUCH ULTRA TEST test strip     . polyethylene glycol-electrolytes (NULYTELY/GOLYTELY) 420 G solution Take by mouth.    . potassium chloride SA (K-DUR,KLOR-CON) 20 MEQ tablet     . predniSONE (DELTASONE) 5 MG tablet Take 5 mg by mouth at bedtime.     . pregabalin (LYRICA) 50 MG capsule Take 50 mg by mouth 2 (two) times daily.    . promethazine (PHENERGAN) 25 MG tablet     . saxagliptin HCl (ONGLYZA) 5 MG TABS tablet Take 5 mg by mouth at bedtime.    . sulfamethoxazole-trimethoprim (BACTRIM DS,SEPTRA DS) 800-160 MG per tablet     . Syringe, Disposable, 1 ML MISC 300 Syringes by Does not apply route daily. 300 each 0  . Tofacitinib Citrate 5 MG TABS Take 1 tablet by mouth 2 (two) times daily.      No current facility-administered medications for this visit.    Medical Decision Making:  Established Problem, Stable/Improving (1), Review and summation of old records (2) and Review of Last Therapy Session (1)  Treatment Plan Summary:Medication management  Discussed with patient  about the medications  She  will continue on Lexapro 20 mg in the morning and lamotrigine 100 mg at bedtime  She will continue on Xanax 0.75 mg at bedtime on a  when necessary basis.  She will follow-up with her primary care physician Dr. Humphrey Rolls on a regular basis. Follow-up in 3 months or earlier.   More than 50% of the time spent in psychoeducation, counseling and coordination of care.    This note was generated in part or whole with voice recognition software. Voice regonition is usually quite accurate but there are transcription errors that can and very often do occur. I apologize for any typographical errors that were not detected and corrected.   Rainey Pines, MD  10/16/2015, 3:02 PM

## 2015-10-18 DIAGNOSIS — N390 Urinary tract infection, site not specified: Secondary | ICD-10-CM | POA: Diagnosis not present

## 2015-10-18 DIAGNOSIS — R300 Dysuria: Secondary | ICD-10-CM | POA: Diagnosis not present

## 2015-10-31 DIAGNOSIS — N390 Urinary tract infection, site not specified: Secondary | ICD-10-CM | POA: Diagnosis not present

## 2015-10-31 DIAGNOSIS — G43109 Migraine with aura, not intractable, without status migrainosus: Secondary | ICD-10-CM | POA: Diagnosis not present

## 2015-10-31 DIAGNOSIS — D481 Neoplasm of uncertain behavior of connective and other soft tissue: Secondary | ICD-10-CM | POA: Diagnosis not present

## 2015-10-31 DIAGNOSIS — E1165 Type 2 diabetes mellitus with hyperglycemia: Secondary | ICD-10-CM | POA: Diagnosis not present

## 2015-10-31 DIAGNOSIS — J449 Chronic obstructive pulmonary disease, unspecified: Secondary | ICD-10-CM | POA: Diagnosis not present

## 2015-11-01 ENCOUNTER — Ambulatory Visit
Admission: RE | Admit: 2015-11-01 | Discharge: 2015-11-01 | Disposition: A | Discharge status: Home / Self Care | Payer: Medicare Other | Source: Ambulatory Visit | Attending: Nurse Practitioner | Admitting: Nurse Practitioner

## 2015-11-01 DIAGNOSIS — I6782 Cerebral ischemia: Secondary | ICD-10-CM | POA: Insufficient documentation

## 2015-11-01 DIAGNOSIS — G44009 Cluster headache syndrome, unspecified, not intractable: Secondary | ICD-10-CM

## 2015-11-01 DIAGNOSIS — M2548 Effusion, other site: Secondary | ICD-10-CM | POA: Insufficient documentation

## 2015-11-01 DIAGNOSIS — R51 Headache: Secondary | ICD-10-CM | POA: Diagnosis not present

## 2015-11-01 DIAGNOSIS — G9389 Other specified disorders of brain: Secondary | ICD-10-CM | POA: Insufficient documentation

## 2015-11-27 DIAGNOSIS — R51 Headache: Secondary | ICD-10-CM | POA: Diagnosis not present

## 2015-11-27 DIAGNOSIS — J309 Allergic rhinitis, unspecified: Secondary | ICD-10-CM | POA: Diagnosis not present

## 2015-11-27 DIAGNOSIS — J3489 Other specified disorders of nose and nasal sinuses: Secondary | ICD-10-CM | POA: Diagnosis not present

## 2015-12-04 DIAGNOSIS — R442 Other hallucinations: Secondary | ICD-10-CM | POA: Insufficient documentation

## 2015-12-04 DIAGNOSIS — G44221 Chronic tension-type headache, intractable: Secondary | ICD-10-CM | POA: Insufficient documentation

## 2015-12-04 DIAGNOSIS — J301 Allergic rhinitis due to pollen: Secondary | ICD-10-CM | POA: Diagnosis not present

## 2015-12-04 DIAGNOSIS — G479 Sleep disorder, unspecified: Secondary | ICD-10-CM | POA: Diagnosis not present

## 2015-12-04 DIAGNOSIS — R262 Difficulty in walking, not elsewhere classified: Secondary | ICD-10-CM | POA: Diagnosis not present

## 2015-12-04 DIAGNOSIS — G44229 Chronic tension-type headache, not intractable: Secondary | ICD-10-CM | POA: Diagnosis not present

## 2015-12-07 ENCOUNTER — Other Ambulatory Visit: Payer: Self-pay | Admitting: Psychiatry

## 2015-12-10 IMAGING — CR RIGHT HIP - COMPLETE 2+ VIEW
1 series · 4 of 4 positions shown · non-contrast
Comparison: 03/23/2007 and CT 09/16/2010

CLINICAL DATA: Right hip pain after falling last [REDACTED].
Anterior knee pain.

EXAM:
RIGHT HIP - COMPLETE 2+ VIEW

[Series 1: t hip ap right · 0.14mm/px · 4 of 4 slices shown]
[im 1/4]
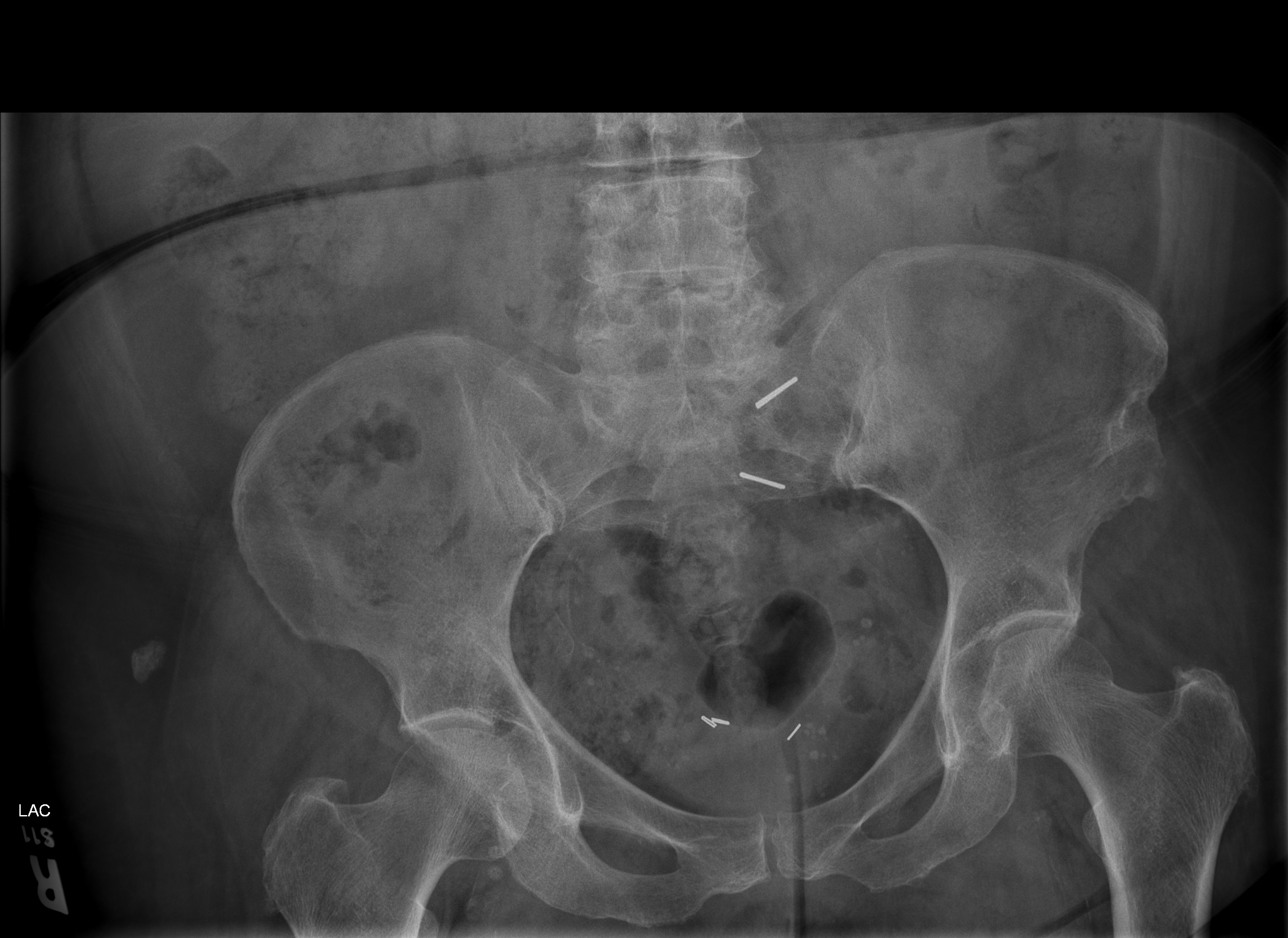
[im 2/4]
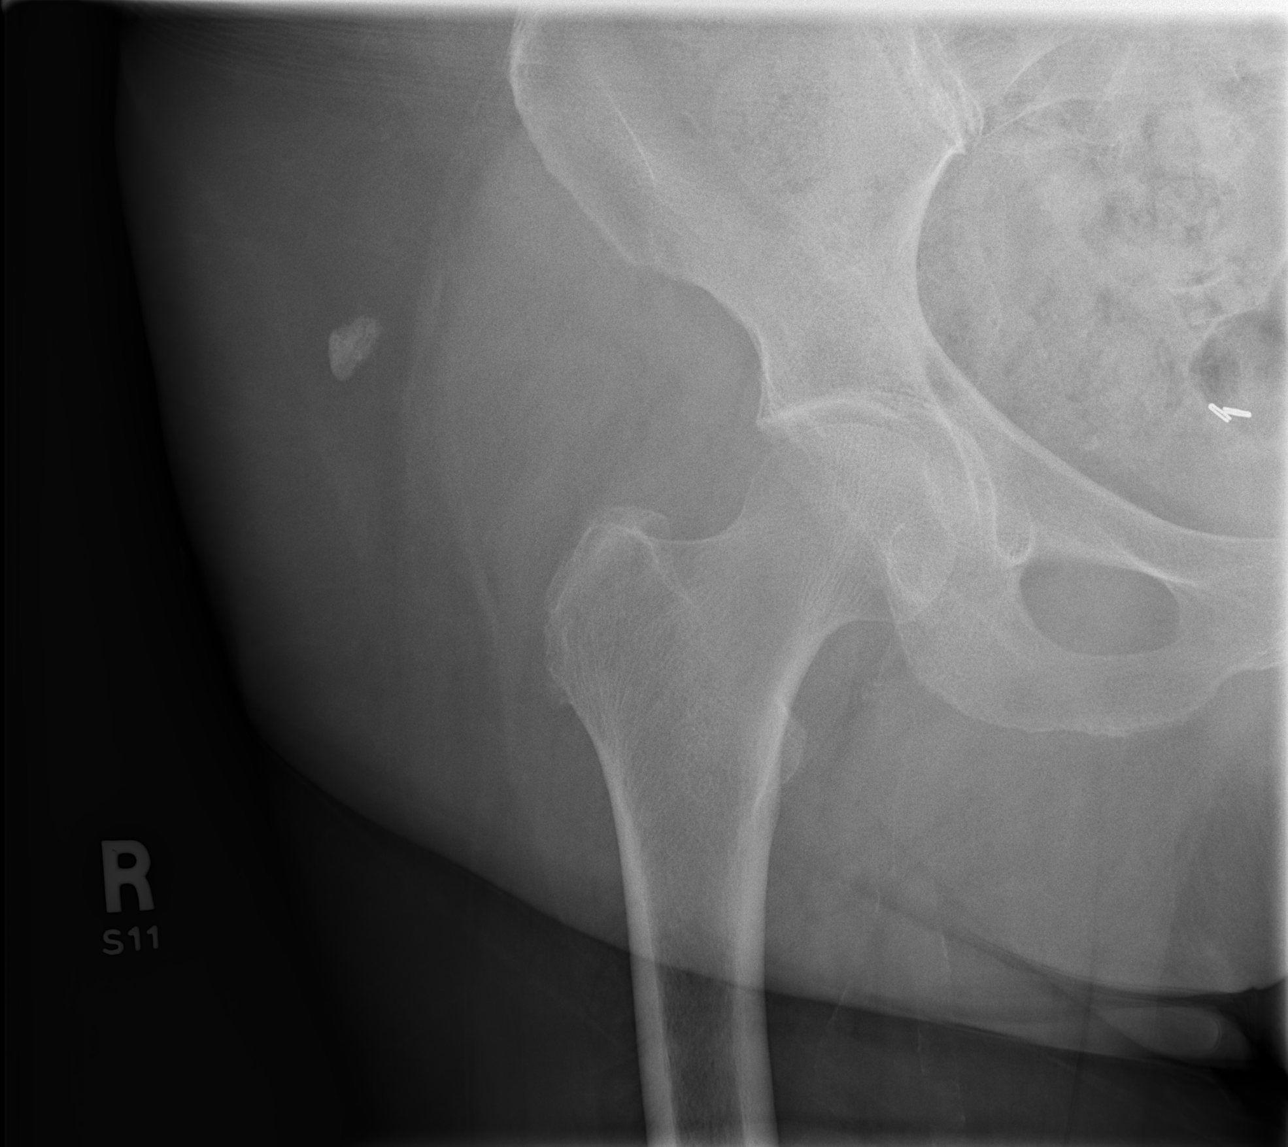
[im 3/4]
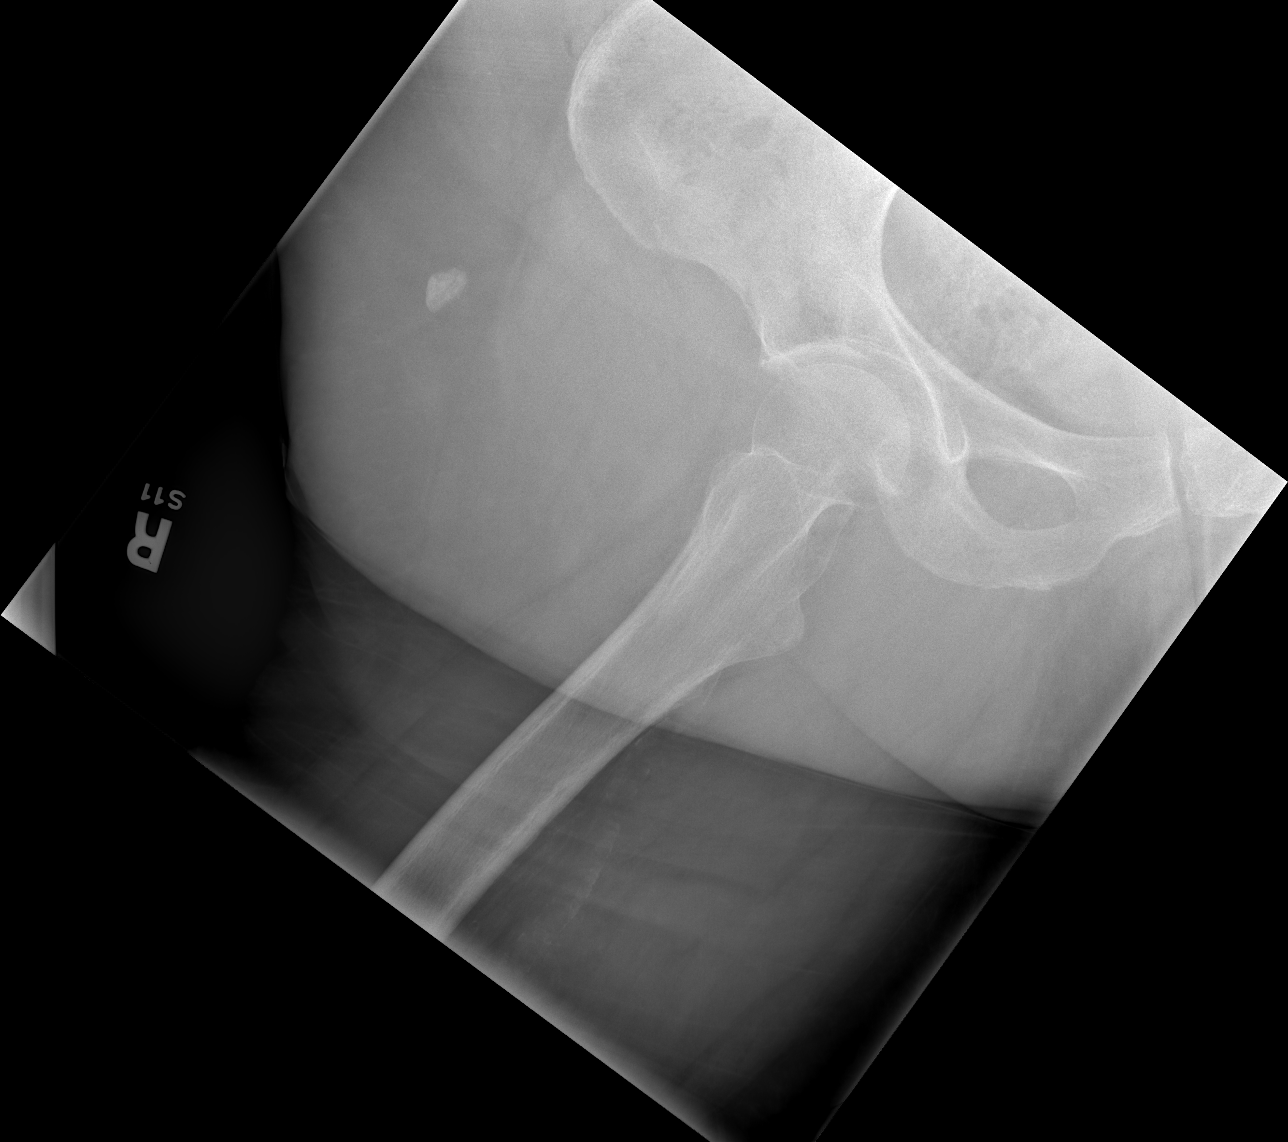
[im 4/4]
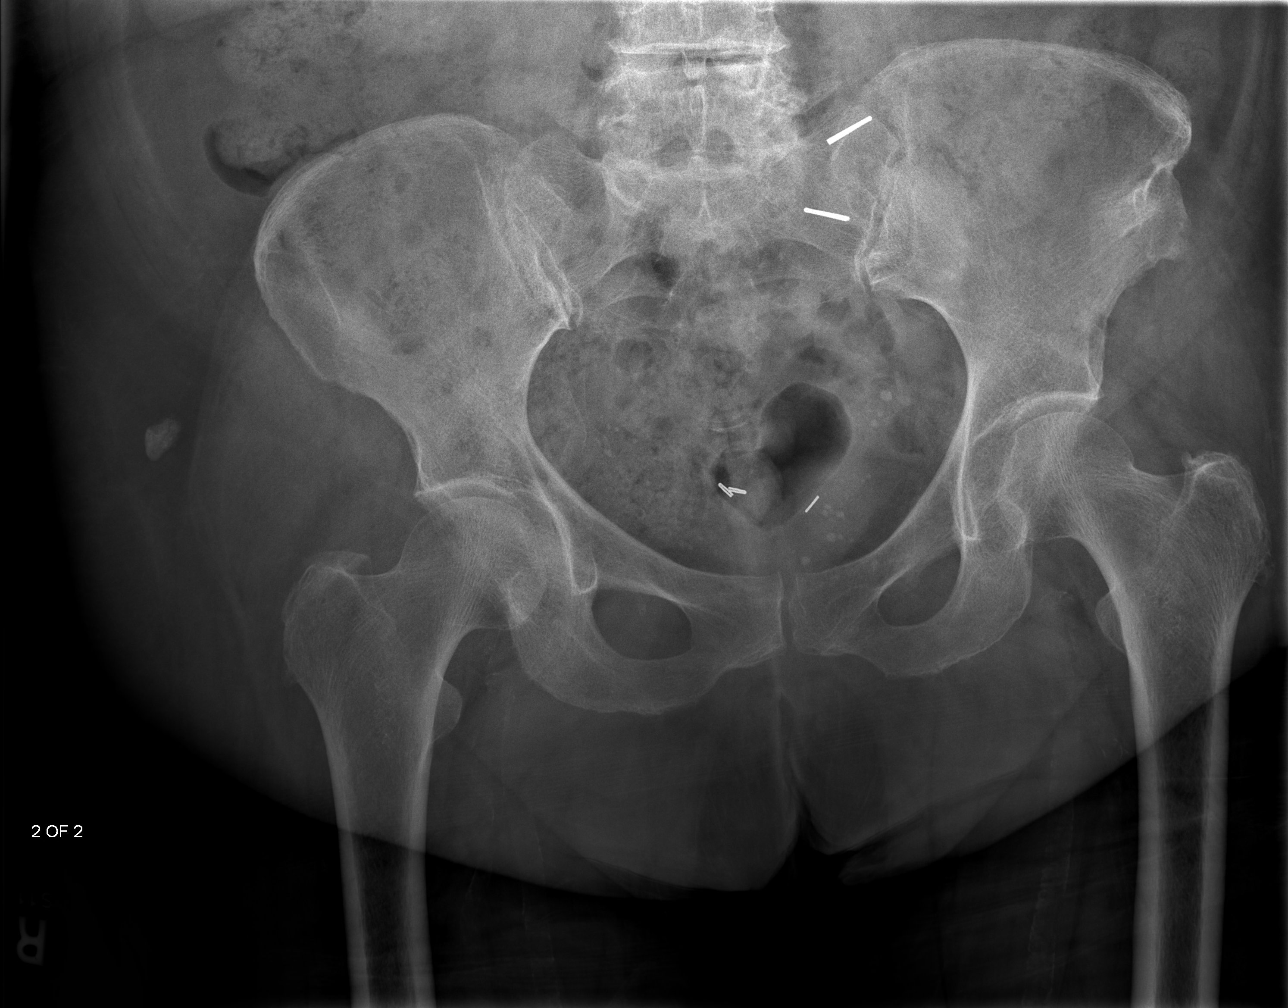

[4 of 4 positions shown; findings below may reference images not displayed]

FINDINGS: Surgical clips are identified in the pelvis. Degenerative changes
are seen the lower lumbar spine. No evidence for acute fracture or
dislocation.
IMPRESSION: No evidence for acute  abnormality.

## 2015-12-16 DIAGNOSIS — M0579 Rheumatoid arthritis with rheumatoid factor of multiple sites without organ or systems involvement: Secondary | ICD-10-CM | POA: Diagnosis not present

## 2015-12-16 DIAGNOSIS — R0989 Other specified symptoms and signs involving the circulatory and respiratory systems: Secondary | ICD-10-CM | POA: Diagnosis not present

## 2015-12-16 DIAGNOSIS — M810 Age-related osteoporosis without current pathological fracture: Secondary | ICD-10-CM | POA: Diagnosis not present

## 2015-12-20 DIAGNOSIS — R442 Other hallucinations: Secondary | ICD-10-CM | POA: Diagnosis not present

## 2015-12-20 DIAGNOSIS — R569 Unspecified convulsions: Secondary | ICD-10-CM | POA: Diagnosis not present

## 2015-12-21 ENCOUNTER — Emergency Department
Admission: EM | Admit: 2015-12-21 | Discharge: 2015-12-21 | Disposition: A | Discharge status: Home / Self Care | Payer: Medicare Other | Attending: Emergency Medicine | Admitting: Emergency Medicine

## 2015-12-21 DIAGNOSIS — Z79899 Other long term (current) drug therapy: Secondary | ICD-10-CM | POA: Insufficient documentation

## 2015-12-21 DIAGNOSIS — E669 Obesity, unspecified: Secondary | ICD-10-CM | POA: Diagnosis not present

## 2015-12-21 DIAGNOSIS — L089 Local infection of the skin and subcutaneous tissue, unspecified: Secondary | ICD-10-CM | POA: Diagnosis not present

## 2015-12-21 DIAGNOSIS — M810 Age-related osteoporosis without current pathological fracture: Secondary | ICD-10-CM | POA: Diagnosis not present

## 2015-12-21 DIAGNOSIS — E119 Type 2 diabetes mellitus without complications: Secondary | ICD-10-CM | POA: Insufficient documentation

## 2015-12-21 DIAGNOSIS — F315 Bipolar disorder, current episode depressed, severe, with psychotic features: Secondary | ICD-10-CM | POA: Insufficient documentation

## 2015-12-21 DIAGNOSIS — Z96653 Presence of artificial knee joint, bilateral: Secondary | ICD-10-CM | POA: Insufficient documentation

## 2015-12-21 DIAGNOSIS — M1990 Unspecified osteoarthritis, unspecified site: Secondary | ICD-10-CM | POA: Insufficient documentation

## 2015-12-21 DIAGNOSIS — I10 Essential (primary) hypertension: Secondary | ICD-10-CM | POA: Insufficient documentation

## 2015-12-21 DIAGNOSIS — R220 Localized swelling, mass and lump, head: Secondary | ICD-10-CM | POA: Diagnosis present

## 2015-12-21 DIAGNOSIS — Z792 Long term (current) use of antibiotics: Secondary | ICD-10-CM | POA: Insufficient documentation

## 2015-12-21 DIAGNOSIS — I2510 Atherosclerotic heart disease of native coronary artery without angina pectoris: Secondary | ICD-10-CM | POA: Diagnosis not present

## 2015-12-21 DIAGNOSIS — K122 Cellulitis and abscess of mouth: Secondary | ICD-10-CM

## 2015-12-21 DIAGNOSIS — J449 Chronic obstructive pulmonary disease, unspecified: Secondary | ICD-10-CM | POA: Diagnosis not present

## 2015-12-21 DIAGNOSIS — M5136 Other intervertebral disc degeneration, lumbar region: Secondary | ICD-10-CM | POA: Diagnosis not present

## 2015-12-21 DIAGNOSIS — Z85841 Personal history of malignant neoplasm of brain: Secondary | ICD-10-CM | POA: Diagnosis not present

## 2015-12-21 DIAGNOSIS — B370 Candidal stomatitis: Secondary | ICD-10-CM | POA: Diagnosis not present

## 2015-12-21 DIAGNOSIS — J45909 Unspecified asthma, uncomplicated: Secondary | ICD-10-CM | POA: Diagnosis not present

## 2015-12-21 DIAGNOSIS — Z794 Long term (current) use of insulin: Secondary | ICD-10-CM | POA: Diagnosis not present

## 2015-12-21 DIAGNOSIS — B9689 Other specified bacterial agents as the cause of diseases classified elsewhere: Secondary | ICD-10-CM | POA: Diagnosis not present

## 2015-12-21 MED ORDER — OXYCODONE-ACETAMINOPHEN 5-325 MG PO TABS: 1.0000 | ORAL_TABLET | Freq: Three times a day (TID) | ORAL | Status: DC | PRN

## 2015-12-21 MED ORDER — CEPHALEXIN 500 MG PO CAPS
500.0000 mg | ORAL_CAPSULE | Freq: Once | ORAL | Status: AC
  Administered 2015-12-21: 500 mg via ORAL
  Filled 2015-12-21: qty 1

## 2015-12-21 MED ORDER — CEPHALEXIN 500 MG PO CAPS: 500.0000 mg | ORAL_CAPSULE | Freq: Four times a day (QID) | ORAL | Status: DC

## 2015-12-21 NOTE — ED Provider Notes (Signed)
[facility] Emergency Department Provider Note   ____________________________________________  Time seen: Approximately 8:37 PM  I have reviewed the triage vital signs and the nursing notes.   HISTORY  Chief Complaint Facial Swelling    HPI VYLA PINT is a 70 y.o. female patient complain of pain and swelling right lateral lower lip. Patient states she noticed a raised lesion couple days ago and tried a popping open resulting in a small amount of yellowish drainage. Patient states increased swelling and redness since trying the "pop" the lesion. Patient denies any fever with this complaint. Patient has used over-the-counter ointments with no relief. Patient rates the pain as a 6/10.  Past Medical History  Diagnosis Date  . COPD (chronic obstructive pulmonary disease) ([place])   . Diabetes mellitus   . Hypertension   . Depression   . Anxiety   . Arthritis     RA  . GERD (gastroesophageal reflux disease)   . Osteopenia   . DDD (degenerative disc disease)   . Obese   . PTSD (post-traumatic stress disorder)   . Asthma   . Coronary artery disease   . Heart murmur   . Brain tumor (benign) ([place])   . Headache   . Obesity   . Lumbar degenerative disc disease   . Osteoporosis   . Pneumonia   . Migraines   . Anginal pain ([place])   . Heart murmur   . Hypercholesteremia     Patient Active Problem List   Diagnosis Date Noted  . Degeneration of intervertebral disc of lumbar region 07/15/2015  . Seropositive rheumatoid arthritis ([place]) 07/15/2015  . Asthma with acute exacerbation 03/10/2015  . Hypokalemia 03/01/2015  . Hyponatremia 03/01/2015  . DDD (degenerative disc disease), lumbar 01/31/2015  . Arthritis, degenerative 01/31/2015  . Rheumatoid arthritis with rheumatoid factor ([place]) 01/31/2015  . Diabetes mellitus, type II ([place]) 12/24/2014  . HTN (hypertension) 12/24/2014  . Sepsis ([place]) 12/24/2014  . Left knee pain 12/24/2014  . GERD  (gastroesophageal reflux disease) 12/24/2014  . COPD (chronic obstructive pulmonary disease) ([place]) 12/24/2014  . Depression 12/24/2014  . Anxiety 12/24/2014  . Severe bipolar disorder with psychotic features, mood-congruent ([place]) 11/16/2014  . Neurosis, posttraumatic 11/16/2014  . H/O diabetes mellitus 11/16/2014  . H/O: HTN (hypertension) 11/16/2014  . H/O gastric ulcer 11/16/2014  . H/O: obesity 11/16/2014  . H/O arthritis 11/16/2014  . Barton's fracture of distal radius, closed 09/19/2014  . Neuritis or radiculitis due to rupture of lumbar intervertebral disc 05/11/2014  . Cervico-occipital neuralgia 03/26/2014  . Difficulty in walking 03/26/2014  . Cervical spine syndrome 01/26/2014  . Cephalalgia 01/09/2014  . Disordered sleep 01/09/2014  . Diabetes ([place]) 10/19/2013  . BP (high blood pressure) 10/19/2013  . Adiposity 10/19/2013  . Cardiac murmur 10/19/2013  . PNA (pneumonia) 10/19/2013  . Breath shortness 10/19/2013  . Chronic obstructive pulmonary disease ([place]) 10/19/2013  . Diabetes mellitus ([place]) 10/19/2013    Past Surgical History  Procedure Laterality Date  . Hand reconstruction  2011    right-multiple finger joint reconst  . Abdominal hysterectomy    . Cervical fusion    . Joint replacement      bilat knee replacements  . Cholecystectomy    . Foot arthroplasty      toes x2 rt foot  . Finger arthroplasty  02/23/2012    Procedure: FINGER ARTHROPLASTY;  Surgeon: Cammie Sickle., MD;  Location: [facility];  Service: Orthopedics;  Laterality: Left;  Extensor carpi radialis longus to Extensor carpi ulnaris transfer, left Metaphalangeal reconstructions of index and long fingers,  . Colonoscopy with propofol N/A 09/20/2015    Procedure: COLONOSCOPY WITH PROPOFOL;  Surgeon: Josefine Class, MD;  Location: [facility] ENDOSCOPY;  Service: Endoscopy;  Laterality: N/A;    Current Outpatient Rx  Name  Route  Sig  Dispense  Refill  . acyclovir ointment  (ZOVIRAX) 5 %   Topical   Apply topically every 3 (three) hours. Use as needed for blistering.   5 g   0   . albuterol (PROVENTIL HFA;VENTOLIN HFA) 108 (90 BASE) MCG/ACT inhaler   Inhalation   Inhale 2 puffs into the lungs every 6 (six) hours as needed for wheezing or shortness of breath.          Marland Kitchen alendronate (FOSAMAX) 70 MG tablet   Oral   Take 70 mg by mouth once a week. Pt takes on Monday.         . ALPRAZolam (XANAX) 0.5 MG tablet   Oral   Take 1.5 tablets (0.75 mg total) by mouth at bedtime as needed for anxiety.   45 tablet   2   . azelastine (ASTELIN) 0.1 % nasal spray   Each Nare   Place 1 spray into both nostrils 2 (two) times daily as needed for rhinitis.          Marland Kitchen azithromycin (ZITHROMAX) 250 MG tablet      Take one tablet per day.   4 each   0   . benzonatate (TESSALON) 200 MG capsule   Oral   Take 1 capsule (200 mg total) by mouth 3 (three) times daily as needed for cough.   20 capsule   0   . budesonide-formoterol (SYMBICORT) 80-4.5 MCG/ACT inhaler   Inhalation   Inhale 2 puffs into the lungs 2 (two) times daily.         . butalbital-acetaminophen-caffeine (FIORICET) 50-325-40 MG tablet   Oral   Take 1-2 tablets by mouth every 6 (six) hours as needed for headache.   20 tablet   0   . Cholecalciferol (VITAMIN D) 2000 UNITS CAPS   Oral   Take 1 capsule by mouth daily.         . cloNIDine (CATAPRES) 0.1 MG tablet   Oral   Take 0.1 mg by mouth 2 (two) times daily.         . diphenhydrAMINE (BENADRYL) 25 MG tablet   Oral   Take 25 mg by mouth every 6 (six) hours as needed for allergies.         Marland Kitchen escitalopram (LEXAPRO) 20 MG tablet   Oral   Take 1 tablet (20 mg total) by mouth every morning.   90 tablet   3   . esomeprazole (NEXIUM) 40 MG capsule   Oral   Take 40 mg by mouth at bedtime.         . fluconazole (DIFLUCAN) 150 MG tablet               . HYDROcodone-acetaminophen (NORCO/VICODIN) 5-325 MG per tablet    Oral   Take 1 tablet by mouth every 4 (four) hours as needed for moderate pain.   12 tablet   0   . insulin glargine (LANTUS) 100 UNIT/ML injection   Subcutaneous   Inject 0.15 mLs (15 Units total) into the skin at bedtime.   10 mL   11   . insulin starter kit- pen needles MISC  Other   1 kit by Other route once.   1 kit   0   . isosorbide mononitrate (IMDUR) 30 MG 24 hr tablet   Oral   Take 30 mg by mouth daily.         Marland Kitchen ketoconazole (NIZORAL) 2 % cream   Topical   Apply 1 application topically at bedtime.         . lamoTRIgine (LAMICTAL) 100 MG tablet   Oral   Take 1 tablet (100 mg total) by mouth daily.   90 tablet   3   . levocetirizine (XYZAL) 5 MG tablet   Oral   Take 5 mg by mouth daily.          . Lidocaine 4 % PTCH   Apply externally   Apply 1 patch topically daily. Remove after 12 hours. No more than 3 patches at a time   15 patch   0   . meloxicam (MOBIC) 7.5 MG tablet   Oral   Take 7.5 mg by mouth daily as needed for pain.          Marland Kitchen nystatin (MYCOSTATIN) 100000 UNIT/ML suspension               . ondansetron (ZOFRAN-ODT) 8 MG disintegrating tablet   Oral   Take 8 mg by mouth 3 (three) times daily as needed for nausea or vomiting.         . ONE TOUCH ULTRA TEST test strip                 Dispense as written.   . polyethylene glycol-electrolytes (NULYTELY/GOLYTELY) 420 G solution   Oral   Take by mouth.         . potassium chloride SA (K-DUR,KLOR-CON) 20 MEQ tablet               . predniSONE (DELTASONE) 5 MG tablet   Oral   Take 5 mg by mouth at bedtime.          . pregabalin (LYRICA) 50 MG capsule   Oral   Take 50 mg by mouth 2 (two) times daily.         . promethazine (PHENERGAN) 25 MG tablet               . saxagliptin HCl (ONGLYZA) 5 MG TABS tablet   Oral   Take 5 mg by mouth at bedtime.         . sulfamethoxazole-trimethoprim (BACTRIM DS,SEPTRA DS) 800-160 MG per tablet               .  Syringe, Disposable, 1 ML MISC   Does not apply   300 Syringes by Does not apply route daily.   300 each   0   . Tofacitinib Citrate 5 MG TABS   Oral   Take 1 tablet by mouth 2 (two) times daily.            Allergies Gabapentin; Iodine; Naproxen; Nsaids; and Tramadol  Family History  Problem Relation Age of Onset  . Depression Sister   . CAD    . Hypertension    . Diabetes Mellitus II    . Arthritis    . Hypertension Mother   . Arthritis/Rheumatoid Mother   . Heart attack Father   . Hypertension Father     Social History Social History  Substance Use Topics  . Smoking status: Never Smoker   . Smokeless tobacco: Never Used  . Alcohol  Use: No    Review of Systems Constitutional: No fever/chills Eyes: No visual changes. ENT: No sore throat. Cardiovascular: Denies chest pain. Respiratory: Denies shortness of breath. Gastrointestinal: No abdominal pain.  No nausea, no vomiting.  No diarrhea.  No constipation. Genitourinary: Negative for dysuria. Musculoskeletal: Negative for back pain. Skin: Negative for rash. Neurological: Negative for headaches, focal weakness or numbness. Psychiatric:Anxiety and depression Endocrine:Hypertension, hyperlipidemia, and diabetes ____________________________________________   PHYSICAL EXAM:  VITAL SIGNS: ED Triage Vitals  Enc Vitals Group     BP 12/21/15 1955 166/91 mmHg     Pulse Rate 12/21/15 1955 108     Resp 12/21/15 1955 18     Temp 12/21/15 1955 99 F (37.2 C)     Temp Source 12/21/15 1955 Oral     SpO2 12/21/15 1955 99 %     Weight 12/21/15 1955 149 lb (67.586 kg)     Height 12/21/15 1955 4' 9.5" (1.461 m)     Head Cir --      Peak Flow --      Pain Score 12/21/15 1955 6     Pain Loc --      Pain Edu? --      Excl. in [place]? --     Constitutional: Alert and oriented. Well appearing and in no acute distress. Eyes: Conjunctivae are normal. PERRL. EOMI. Head: Atraumatic. Nose: No  congestion/rhinnorhea. Mouth/Throat: Mucous membranes are moist.  Oropharynx non-erythematous. Neck: No stridor.  No cervical spine tenderness to palpation. Hematological/Lymphatic/Immunilogical: No cervical lymphadenopathy. Cardiovascular: Normal rate, regular rhythm. Grossly normal heart sounds.  Good peripheral circulation. Respiratory: Normal respiratory effort.  No retractions. Lungs CTAB. Gastrointestinal: Soft and nontender. No distention. No abdominal bruits. No CVA tenderness. Musculoskeletal: No lower extremity tenderness nor edema.  No joint effusions. Neurologic:  Normal speech and language. No gross focal neurologic deficits are appreciated. No gait instability. Skin:  Edematous erythematous papular lesion right lower lip. Psychiatric: Mood and affect are normal. Speech and behavior are normal.  ____________________________________________   LABS (all labs ordered are listed, but only abnormal results are displayed)  Labs Reviewed - No data to display ____________________________________________  EKG   ____________________________________________  RADIOLOGY   ____________________________________________   PROCEDURES  Procedure(s) performed: None  Critical Care performed: No  ____________________________________________   INITIAL IMPRESSION / ASSESSMENT AND PLAN / ED COURSE  Pertinent labs & imaging results that were available during my care of the patient were reviewed by me and considered in my medical decision making (see chart for details).  Cellulitis right lower lip. Patient given discharge Instructions. Patient started on Keflex. Patient given 2 day prescription for Percocets take as needed for pain. Patient advised follow-up family doctor if no improvement in 3-5 days. Return by ER for condition worsens. ____________________________________________   FINAL CLINICAL IMPRESSION(S) / ED DIAGNOSES  Final diagnoses:  Bacterial skin infection of mouth       NEW MEDICATIONS STARTED DURING THIS VISIT:  New Prescriptions   No medications on file     Note:  This document was prepared using Dragon voice recognition software and may include unintentional dictation errors.     Sable Feil, PA-C 12/21/15 2247  Daymon Larsen, MD 12/21/15 [phone]

## 2015-12-21 NOTE — Discharge Instructions (Signed)
Take medication as directed.

## 2015-12-21 NOTE — ED Notes (Signed)
Pt with swelling below right lower lip; red/raised area; pt says it feels "like a knot up under it"; has tried to open area but only got out small amount of yellowish drainage; denies fever; denies difficulty swallowing; ambulatory with steady gait

## 2015-12-25 ENCOUNTER — Other Ambulatory Visit: Payer: Self-pay | Admitting: Psychiatry

## 2015-12-25 ENCOUNTER — Other Ambulatory Visit: Payer: Self-pay | Admitting: Student

## 2015-12-25 DIAGNOSIS — R110 Nausea: Secondary | ICD-10-CM

## 2015-12-25 DIAGNOSIS — R6881 Early satiety: Secondary | ICD-10-CM

## 2015-12-25 NOTE — Telephone Encounter (Signed)
Need appointment

## 2015-12-27 DIAGNOSIS — R945 Abnormal results of liver function studies: Secondary | ICD-10-CM | POA: Diagnosis not present

## 2015-12-27 DIAGNOSIS — E1165 Type 2 diabetes mellitus with hyperglycemia: Secondary | ICD-10-CM | POA: Diagnosis not present

## 2015-12-27 DIAGNOSIS — M722 Plantar fascial fibromatosis: Secondary | ICD-10-CM | POA: Diagnosis not present

## 2015-12-27 DIAGNOSIS — S90122A Contusion of left lesser toe(s) without damage to nail, initial encounter: Secondary | ICD-10-CM | POA: Diagnosis not present

## 2015-12-27 DIAGNOSIS — B351 Tinea unguium: Secondary | ICD-10-CM | POA: Diagnosis not present

## 2015-12-27 DIAGNOSIS — E039 Hypothyroidism, unspecified: Secondary | ICD-10-CM | POA: Diagnosis not present

## 2015-12-27 DIAGNOSIS — E559 Vitamin D deficiency, unspecified: Secondary | ICD-10-CM | POA: Diagnosis not present

## 2015-12-27 DIAGNOSIS — M064 Inflammatory polyarthropathy: Secondary | ICD-10-CM | POA: Diagnosis not present

## 2015-12-27 DIAGNOSIS — D509 Iron deficiency anemia, unspecified: Secondary | ICD-10-CM | POA: Diagnosis not present

## 2015-12-27 DIAGNOSIS — M79672 Pain in left foot: Secondary | ICD-10-CM | POA: Diagnosis not present

## 2016-01-03 DIAGNOSIS — L03211 Cellulitis of face: Secondary | ICD-10-CM | POA: Diagnosis not present

## 2016-01-03 DIAGNOSIS — R569 Unspecified convulsions: Secondary | ICD-10-CM | POA: Insufficient documentation

## 2016-01-03 DIAGNOSIS — D519 Vitamin B12 deficiency anemia, unspecified: Secondary | ICD-10-CM | POA: Diagnosis not present

## 2016-01-07 ENCOUNTER — Ambulatory Visit: Payer: Medicare Other | Attending: Student

## 2016-01-08 DIAGNOSIS — I10 Essential (primary) hypertension: Secondary | ICD-10-CM | POA: Diagnosis not present

## 2016-01-08 DIAGNOSIS — E119 Type 2 diabetes mellitus without complications: Secondary | ICD-10-CM | POA: Diagnosis not present

## 2016-01-08 DIAGNOSIS — J449 Chronic obstructive pulmonary disease, unspecified: Secondary | ICD-10-CM | POA: Diagnosis not present

## 2016-01-08 DIAGNOSIS — R0602 Shortness of breath: Secondary | ICD-10-CM | POA: Diagnosis not present

## 2016-01-13 DIAGNOSIS — M0579 Rheumatoid arthritis with rheumatoid factor of multiple sites without organ or systems involvement: Secondary | ICD-10-CM | POA: Diagnosis not present

## 2016-01-13 DIAGNOSIS — M810 Age-related osteoporosis without current pathological fracture: Secondary | ICD-10-CM | POA: Diagnosis not present

## 2016-01-16 ENCOUNTER — Encounter: Payer: Self-pay | Admitting: Psychiatry

## 2016-01-16 ENCOUNTER — Ambulatory Visit (INDEPENDENT_AMBULATORY_CARE_PROVIDER_SITE_OTHER): Payer: 59 | Admitting: Psychiatry

## 2016-01-16 ENCOUNTER — Ambulatory Visit: Payer: 59 | Admitting: Psychiatry

## 2016-01-16 VITALS — BP 156/91 | HR 101 | Temp 96.4°F | Ht <= 58 in | Wt 150.8 lb

## 2016-01-16 DIAGNOSIS — F3160 Bipolar disorder, current episode mixed, unspecified: Secondary | ICD-10-CM | POA: Diagnosis not present

## 2016-01-16 MED ORDER — ESCITALOPRAM OXALATE 10 MG PO TABS
10.0000 mg | ORAL_TABLET | Freq: Every morning | ORAL | Status: DC
Start: 2016-01-16 — End: 2016-02-25

## 2016-01-16 MED ORDER — ALPRAZOLAM 0.5 MG PO TABS: 0.5000 mg | ORAL_TABLET | Freq: Every evening | ORAL | Status: DC | PRN

## 2016-01-16 MED ORDER — LAMOTRIGINE 100 MG PO TABS: 100.0000 mg | ORAL_TABLET | Freq: Every day | ORAL | Status: DC

## 2016-01-16 NOTE — Progress Notes (Signed)
[facility] MD/PA/NP OP Progress Note  01/16/2016 9:59 AM Meghan [name]  MRN:  [id number]  Subjective:   Meghan [name]  is a 70 year old female who presented for the follow-up appointment. She reported that she has been feeling dizzy for the past few weeks and her medications have been adjusted by her neurologist. She reported that she has been seeing her primary care physician Leta Baptist on a regular basis and he has started her on several different medications including Topamax zolpidem due to her chronic headaches. Patient reported that she does not feel better. We reviewed her medication list closely and she was noticed to be on higher doses of Lexapro and lamotrigine. Patient reported that she does not know why her medications were adjusted by the PCP. Patient was upset about her medication list. She reported that she is going to discuss her medications with her PCP. She reported that she feels tired most of the time. She currently has been compliant with her medications. She denied having any mood swings anger or anxiety.   She denied having any suicidal ideations or plans  Chief Complaint:   Visit Diagnosis:     ICD-9-CM ICD-10-CM   1. Bipolar I disorder, most recent episode mixed ([place]) 296.60 F31.60     Past Medical History:  Past Medical History  Diagnosis Date  . COPD (chronic obstructive pulmonary disease) ([place])   . Diabetes mellitus   . Hypertension   . Depression   . Anxiety   . Arthritis     RA  . GERD (gastroesophageal reflux disease)   . Osteopenia   . DDD (degenerative disc disease)   . Obese   . PTSD (post-traumatic stress disorder)   . Asthma   . Coronary artery disease   . Heart murmur   . Brain tumor (benign) ([place])   . Headache   . Obesity   . Lumbar degenerative disc disease   . Osteoporosis   . Pneumonia   . Migraines   . Anginal pain ([place])   . Heart murmur   . Hypercholesteremia     Past Surgical History  Procedure Laterality Date  . Hand reconstruction  2011     right-multiple finger joint reconst  . Abdominal hysterectomy    . Cervical fusion    . Joint replacement      bilat knee replacements  . Cholecystectomy    . Foot arthroplasty      toes x2 rt foot  . Finger arthroplasty  02/23/2012    Procedure: FINGER ARTHROPLASTY;  Surgeon: Cammie Sickle., MD;  Location: [facility];  Service: Orthopedics;  Laterality: Left;  Extensor carpi radialis longus to Extensor carpi ulnaris transfer, left Metaphalangeal reconstructions of index and long fingers,  . Colonoscopy with propofol N/A 09/20/2015    Procedure: COLONOSCOPY WITH PROPOFOL;  Surgeon: Josefine Class, MD;  Location: [facility] ENDOSCOPY;  Service: Endoscopy;  Laterality: N/A;   Family History:  Family History  Problem Relation Age of Onset  . Depression Sister   . CAD    . Hypertension    . Diabetes Mellitus II    . Arthritis    . Hypertension Mother   . Arthritis/Rheumatoid Mother   . Heart attack Father   . Hypertension Father    Social History:  Social History   Social History  . Marital Status: Divorced    Spouse Name: N/A  . Number of Children: N/A  . Years of Education: N/A   Social History Main Topics  .  Smoking status: Never Smoker   . Smokeless tobacco: Never Used  . Alcohol Use: No  . Drug Use: No  . Sexual Activity: No   Other Topics Concern  . None   Social History Narrative   Additional History:  Patient currently lives with her family members   Assessment:   Musculoskeletal: Strength & Muscle Tone: within normal limits Gait & Station: normal Patient leans: N/A  Psychiatric Specialty Exam: Anxiety Symptoms include dizziness and nervous/anxious behavior. Patient reports no nausea or palpitations.      Review of Systems  Constitutional: Negative for chills.  HENT: Negative for hearing loss.   Eyes: Negative for photophobia.  Respiratory: Negative for sputum production.   Cardiovascular: Negative for palpitations.   Gastrointestinal: Negative for nausea.  Genitourinary: Negative for frequency.  Musculoskeletal: Positive for back pain.  Skin: Negative for rash.  Neurological: Positive for dizziness. Negative for tingling.  Endo/Heme/Allergies: Negative for environmental allergies.  Psychiatric/Behavioral: Positive for depression. Negative for hallucinations. The patient is nervous/anxious.   All other systems reviewed and are negative.   Blood pressure 160/98, pulse 106, temperature 96.4 F (35.8 C), height 4' 9.6" (1.463 m), weight 150 lb 12.8 oz (68.402 kg), SpO2 96 %.Body mass index is 31.96 kg/(m^2).  General Appearance: Casual  Eye Contact:  Fair  Speech:  Clear and Coherent  Volume:  Normal  Mood:  Anxious  Affect:  Congruent  Thought Process:  Goal Directed  Orientation:  Full (Time, Place, and Person)  Thought Content:  WDL  Suicidal Thoughts:  No  Homicidal Thoughts:  No  Memory:  Immediate;   Fair  Judgement:  Fair  Insight:  Fair  Psychomotor Activity:  Normal  Concentration:  Fair  Recall:  [employer] of Knowledge: Fair  Language: Fair  Akathisia:  No  Handed:  Right  AIMS (if indicated):  none  Assets:  Communication Skills Desire for Improvement Physical Health  ADL's:  Intact  Cognition: WNL  Sleep:  6-7    Is the patient at risk to self?  No. Has the patient been a risk to self in the past 6 months?  No. Has the patient been a risk to self within the distant past?  No. Is the patient a risk to others?  No. Has the patient been a risk to others in the past 6 months?  No. Has the patient been a risk to others within the distant past?  No.  Current Medications: Current Outpatient Prescriptions  Medication Sig Dispense Refill  . acyclovir ointment (ZOVIRAX) 5 % Apply topically every 3 (three) hours. Use as needed for blistering. 5 g 0  . albuterol (PROVENTIL HFA;VENTOLIN HFA) 108 (90 BASE) MCG/ACT inhaler Inhale 2 puffs into the lungs every 6 (six) hours as needed  for wheezing or shortness of breath.     Marland Kitchen alendronate (FOSAMAX) 70 MG tablet Take 70 mg by mouth once a week. Pt takes on Monday.    . ALPRAZolam (XANAX) 0.5 MG tablet Take 1.5 tablets (0.75 mg total) by mouth at bedtime as needed for anxiety. 45 tablet 2  . azelastine (ASTELIN) 0.1 % nasal spray Place 1 spray into both nostrils 2 (two) times daily as needed for rhinitis.     Marland Kitchen azithromycin (ZITHROMAX) 250 MG tablet Take one tablet per day. 4 each 0  . benzonatate (TESSALON) 200 MG capsule Take 1 capsule (200 mg total) by mouth 3 (three) times daily as needed for cough. 20 capsule 0  . budesonide-formoterol (SYMBICORT)  80-4.5 MCG/ACT inhaler Inhale 2 puffs into the lungs 2 (two) times daily.    . butalbital-acetaminophen-caffeine (FIORICET) 50-325-40 MG tablet Take 1-2 tablets by mouth every 6 (six) hours as needed for headache. 20 tablet 0  . cephALEXin (KEFLEX) 500 MG capsule Take 1 capsule (500 mg total) by mouth 4 (four) times daily. 40 capsule 0  . Cholecalciferol (VITAMIN D) 2000 UNITS CAPS Take 1 capsule by mouth daily.    . cloNIDine (CATAPRES) 0.1 MG tablet Take 0.1 mg by mouth 2 (two) times daily.    . diphenhydrAMINE (BENADRYL) 25 MG tablet Take 25 mg by mouth every 6 (six) hours as needed for allergies.    Marland Kitchen escitalopram (LEXAPRO) 10 MG tablet Take 1 tablet (10 mg total) by mouth every morning. 7 tablet 0  . esomeprazole (NEXIUM) 40 MG capsule Take 40 mg by mouth at bedtime.    . fluconazole (DIFLUCAN) 150 MG tablet     . HYDROcodone-acetaminophen (NORCO/VICODIN) 5-325 MG per tablet Take 1 tablet by mouth every 4 (four) hours as needed for moderate pain. 12 tablet 0  . insulin glargine (LANTUS) 100 UNIT/ML injection Inject 0.15 mLs (15 Units total) into the skin at bedtime. 10 mL 11  . insulin starter kit- pen needles MISC 1 kit by Other route once. 1 kit 0  . isosorbide mononitrate (IMDUR) 30 MG 24 hr tablet Take 30 mg by mouth daily.    Marland Kitchen ketoconazole (NIZORAL) 2 % cream Apply 1  application topically at bedtime.    . lamoTRIgine (LAMICTAL) 100 MG tablet Take 1 tablet (100 mg total) by mouth daily. 90 tablet 3  . levocetirizine (XYZAL) 5 MG tablet Take 5 mg by mouth daily.     . Lidocaine 4 % PTCH Apply 1 patch topically daily. Remove after 12 hours. No more than 3 patches at a time 15 patch 0  . meloxicam (MOBIC) 7.5 MG tablet Take 7.5 mg by mouth daily as needed for pain.     Marland Kitchen nystatin (MYCOSTATIN) 100000 UNIT/ML suspension     . ondansetron (ZOFRAN-ODT) 8 MG disintegrating tablet Take 8 mg by mouth 3 (three) times daily as needed for nausea or vomiting.    . ONE TOUCH ULTRA TEST test strip     . oxyCODONE-acetaminophen (ROXICET) 5-325 MG tablet Take 1 tablet by mouth every 8 (eight) hours as needed for moderate pain. 12 tablet 0  . polyethylene glycol-electrolytes (NULYTELY/GOLYTELY) 420 G solution Take by mouth.    . potassium chloride SA (K-DUR,KLOR-CON) 20 MEQ tablet     . predniSONE (DELTASONE) 5 MG tablet Take 5 mg by mouth at bedtime.     . pregabalin (LYRICA) 50 MG capsule Take 50 mg by mouth 2 (two) times daily.    . promethazine (PHENERGAN) 25 MG tablet     . saxagliptin HCl (ONGLYZA) 5 MG TABS tablet Take 5 mg by mouth at bedtime.    . sulfamethoxazole-trimethoprim (BACTRIM DS,SEPTRA DS) 800-160 MG per tablet     . Syringe, Disposable, 1 ML MISC 300 Syringes by Does not apply route daily. 300 each 0  . Tofacitinib Citrate 5 MG TABS Take 1 tablet by mouth 2 (two) times daily.      No current facility-administered medications for this visit.    Medical Decision Making:  Established Problem, Stable/Improving (1), Review and summation of old records (2) and Review of Last Therapy Session (1)  Treatment Plan Summary:Medication management  Discussed with patient about the medications  She  will continue  on Lexapro 10 mg in the morning and lamotrigine 100 mg at bedtime  She will continue on Xanax 0.5 mg at bedtime on a when necessary basis.  She will  follow-up with her primary care physician Dr. Humphrey Rolls on a regular basis. Follow-up in 6 weeks  months or earlier.   More than 50% of the time spent in psychoeducation, counseling and coordination of care.    This note was generated in part or whole with voice recognition software. Voice regonition is usually quite accurate but there are transcription errors that can and very often do occur. I apologize for any typographical errors that were not detected and corrected.   Rainey Pines, MD  01/16/2016, 9:59 AM

## 2016-01-21 DIAGNOSIS — M722 Plantar fascial fibromatosis: Secondary | ICD-10-CM | POA: Diagnosis not present

## 2016-01-21 DIAGNOSIS — S90122D Contusion of left lesser toe(s) without damage to nail, subsequent encounter: Secondary | ICD-10-CM | POA: Diagnosis not present

## 2016-01-22 DIAGNOSIS — R000 Tachycardia, unspecified: Secondary | ICD-10-CM | POA: Diagnosis not present

## 2016-01-22 DIAGNOSIS — I10 Essential (primary) hypertension: Secondary | ICD-10-CM | POA: Diagnosis not present

## 2016-01-24 DIAGNOSIS — R933 Abnormal findings on diagnostic imaging of other parts of digestive tract: Secondary | ICD-10-CM | POA: Diagnosis not present

## 2016-01-24 DIAGNOSIS — K5900 Constipation, unspecified: Secondary | ICD-10-CM | POA: Diagnosis not present

## 2016-01-24 DIAGNOSIS — R1030 Lower abdominal pain, unspecified: Secondary | ICD-10-CM | POA: Diagnosis not present

## 2016-01-27 ENCOUNTER — Encounter: Payer: Self-pay | Admitting: Anesthesiology

## 2016-01-27 ENCOUNTER — Ambulatory Visit: Payer: Medicare Other | Admitting: Anesthesiology

## 2016-01-27 ENCOUNTER — Encounter
Admission: RE | Disposition: A | Discharge status: Home / Self Care | Payer: Self-pay | Source: Ambulatory Visit | Attending: Unknown Physician Specialty

## 2016-01-27 ENCOUNTER — Ambulatory Visit
Admission: RE | Admit: 2016-01-27 | Discharge: 2016-01-27 | Disposition: A | Discharge status: Home / Self Care | Payer: Medicare Other | Source: Ambulatory Visit | Attending: Unknown Physician Specialty | Admitting: Unknown Physician Specialty

## 2016-01-27 DIAGNOSIS — E669 Obesity, unspecified: Secondary | ICD-10-CM | POA: Insufficient documentation

## 2016-01-27 DIAGNOSIS — K5900 Constipation, unspecified: Secondary | ICD-10-CM | POA: Diagnosis not present

## 2016-01-27 DIAGNOSIS — I2510 Atherosclerotic heart disease of native coronary artery without angina pectoris: Secondary | ICD-10-CM | POA: Insufficient documentation

## 2016-01-27 DIAGNOSIS — F209 Schizophrenia, unspecified: Secondary | ICD-10-CM | POA: Diagnosis not present

## 2016-01-27 DIAGNOSIS — F4310 Post-traumatic stress disorder, unspecified: Secondary | ICD-10-CM | POA: Insufficient documentation

## 2016-01-27 DIAGNOSIS — E7800 Pure hypercholesterolemia, unspecified: Secondary | ICD-10-CM | POA: Diagnosis not present

## 2016-01-27 DIAGNOSIS — K640 First degree hemorrhoids: Secondary | ICD-10-CM | POA: Diagnosis not present

## 2016-01-27 DIAGNOSIS — K219 Gastro-esophageal reflux disease without esophagitis: Secondary | ICD-10-CM | POA: Insufficient documentation

## 2016-01-27 DIAGNOSIS — M069 Rheumatoid arthritis, unspecified: Secondary | ICD-10-CM | POA: Insufficient documentation

## 2016-01-27 DIAGNOSIS — I10 Essential (primary) hypertension: Secondary | ICD-10-CM | POA: Insufficient documentation

## 2016-01-27 DIAGNOSIS — J45909 Unspecified asthma, uncomplicated: Secondary | ICD-10-CM | POA: Diagnosis not present

## 2016-01-27 DIAGNOSIS — F329 Major depressive disorder, single episode, unspecified: Secondary | ICD-10-CM | POA: Insufficient documentation

## 2016-01-27 DIAGNOSIS — D120 Benign neoplasm of cecum: Secondary | ICD-10-CM | POA: Insufficient documentation

## 2016-01-27 DIAGNOSIS — J449 Chronic obstructive pulmonary disease, unspecified: Secondary | ICD-10-CM | POA: Insufficient documentation

## 2016-01-27 DIAGNOSIS — G709 Myoneural disorder, unspecified: Secondary | ICD-10-CM | POA: Diagnosis not present

## 2016-01-27 DIAGNOSIS — D122 Benign neoplasm of ascending colon: Secondary | ICD-10-CM | POA: Insufficient documentation

## 2016-01-27 DIAGNOSIS — R011 Cardiac murmur, unspecified: Secondary | ICD-10-CM | POA: Insufficient documentation

## 2016-01-27 DIAGNOSIS — R1084 Generalized abdominal pain: Secondary | ICD-10-CM | POA: Diagnosis present

## 2016-01-27 DIAGNOSIS — G43909 Migraine, unspecified, not intractable, without status migrainosus: Secondary | ICD-10-CM | POA: Diagnosis not present

## 2016-01-27 DIAGNOSIS — E119 Type 2 diabetes mellitus without complications: Secondary | ICD-10-CM | POA: Diagnosis not present

## 2016-01-27 DIAGNOSIS — M5136 Other intervertebral disc degeneration, lumbar region: Secondary | ICD-10-CM | POA: Diagnosis not present

## 2016-01-27 DIAGNOSIS — D123 Benign neoplasm of transverse colon: Secondary | ICD-10-CM | POA: Insufficient documentation

## 2016-01-27 DIAGNOSIS — K648 Other hemorrhoids: Secondary | ICD-10-CM | POA: Diagnosis not present

## 2016-01-27 DIAGNOSIS — M810 Age-related osteoporosis without current pathological fracture: Secondary | ICD-10-CM | POA: Diagnosis not present

## 2016-01-27 DIAGNOSIS — K635 Polyp of colon: Secondary | ICD-10-CM | POA: Diagnosis not present

## 2016-01-27 HISTORY — PX: COLONOSCOPY WITH PROPOFOL: SHX5780

## 2016-01-27 LAB — GLUCOSE, CAPILLARY: Glucose-Capillary: 167 mg/dL — ABNORMAL HIGH (ref 65–99)

## 2016-01-27 SURGERY — COLONOSCOPY WITH PROPOFOL
Anesthesia: General

## 2016-01-27 MED ORDER — SODIUM CHLORIDE 0.9 % IV SOLN
INTRAVENOUS | Status: DC
Start: 2016-01-27 — End: 2016-01-27
  Administered 2016-01-27: 1000 mL via INTRAVENOUS

## 2016-01-27 MED ORDER — PIPERACILLIN-TAZOBACTAM 3.375 G IVPB
3.3750 g | Freq: Once | INTRAVENOUS | Status: AC
  Administered 2016-01-27: 3.375 g via INTRAVENOUS
  Filled 2016-01-27: qty 50

## 2016-01-27 MED ORDER — SODIUM CHLORIDE 0.9 % IV SOLN: INTRAVENOUS | Status: DC

## 2016-01-27 MED ORDER — LIDOCAINE HCL (PF) 2 % IJ SOLN
INTRAMUSCULAR | Status: DC | PRN
  Administered 2016-01-27: 50 mg

## 2016-01-27 MED ORDER — PHENYLEPHRINE HCL 10 MG/ML IJ SOLN
INTRAMUSCULAR | Status: DC | PRN
  Administered 2016-01-27 (×3): 100 ug via INTRAVENOUS

## 2016-01-27 MED ORDER — FENTANYL CITRATE (PF) 100 MCG/2ML IJ SOLN
INTRAMUSCULAR | Status: DC | PRN
  Administered 2016-01-27: 50 ug via INTRAVENOUS

## 2016-01-27 MED ORDER — MIDAZOLAM HCL 5 MG/5ML IJ SOLN
INTRAMUSCULAR | Status: DC | PRN
  Administered 2016-01-27: 1 mg via INTRAVENOUS

## 2016-01-27 MED ORDER — PROPOFOL 500 MG/50ML IV EMUL
INTRAVENOUS | Status: DC | PRN
  Administered 2016-01-27: 50 ug/kg/min via INTRAVENOUS

## 2016-01-27 MED ORDER — ONDANSETRON HCL 4 MG/2ML IJ SOLN
4.0000 mg | Freq: Once | INTRAMUSCULAR | Status: AC
  Administered 2016-01-27: 4 mg via INTRAVENOUS

## 2016-01-27 MED ORDER — PROPOFOL 10 MG/ML IV BOLUS
INTRAVENOUS | Status: DC | PRN
  Administered 2016-01-27: 30 mg via INTRAVENOUS
  Administered 2016-01-27 (×2): 10 mg via INTRAVENOUS

## 2016-01-27 NOTE — Anesthesia Postprocedure Evaluation (Signed)
Anesthesia Post Note  Patient: Meghan [name]  Procedure(s) Performed: Procedure(s) (LRB): COLONOSCOPY WITH PROPOFOL (N/A)  Patient location during evaluation: Endoscopy Anesthesia Type: General Level of consciousness: awake and alert Pain management: pain level controlled Vital Signs Assessment: post-procedure vital signs reviewed and stable Respiratory status: spontaneous breathing, nonlabored ventilation, respiratory function stable and patient connected to nasal cannula oxygen Cardiovascular status: blood pressure returned to baseline and stable Postop Assessment: no signs of nausea or vomiting Anesthetic complications: no    Last Vitals:  Filed Vitals:   01/27/16 0955 01/27/16 1005  BP: 107/77 137/85  Pulse: 78 86  Temp:    Resp: 15 20    Last Pain: There were no vitals filed for this visit.               Declan Mier S

## 2016-01-27 NOTE — Transfer of Care (Signed)
Immediate Anesthesia Transfer of Care Note  Patient: Meghan [name]  Procedure(s) Performed: Procedure(s): COLONOSCOPY WITH PROPOFOL (N/A)  Patient Location: PACU  Anesthesia Type:General  Level of Consciousness: sedated  Airway & Oxygen Therapy: Patient Spontanous Breathing and Patient connected to nasal cannula oxygen  Post-op Assessment: Report given to RN and Post -op Vital signs reviewed and stable  Post vital signs: Reviewed and stable  Last Vitals:  Filed Vitals:   01/27/16 0810 01/27/16 0910  BP: 136/87 99/54  Pulse: 97 84  Temp: 35.8 C 35.9 C  Resp: 16 18    Last Pain: There were no vitals filed for this visit.       Complications: No apparent anesthesia complications

## 2016-01-27 NOTE — Anesthesia Preprocedure Evaluation (Addendum)
Anesthesia Evaluation  Patient identified by MRN, date of birth, ID band Patient awake    Reviewed: Allergy & Precautions, NPO status , Patient's Chart, lab work & pertinent test results, reviewed documented beta blocker date and time   Airway Mallampati: III  TM Distance: >3 FB     Dental  (+) Chipped, Upper Dentures, Partial Lower, Poor Dentition   Pulmonary shortness of breath, asthma , pneumonia, resolved, COPD,           Cardiovascular hypertension, Pt. on medications + angina + CAD       Neuro/Psych  Headaches, PSYCHIATRIC DISORDERS Anxiety Depression Bipolar Disorder Schizophrenia  Neuromuscular disease    GI/Hepatic GERD  ,  Endo/Other  diabetes, Type 2  Renal/GU      Musculoskeletal  (+) Arthritis ,   Abdominal   Peds  Hematology   Anesthesia Other Findings Neck movement OK.  Reproductive/Obstetrics                            Anesthesia Physical Anesthesia Plan  ASA: III  Anesthesia Plan: General   Post-op Pain Management:    Induction: Intravenous  Airway Management Planned: Nasal Cannula  Additional Equipment:   Intra-op Plan:   Post-operative Plan:   Informed Consent: I have reviewed the patients History and Physical, chart, labs and discussed the procedure including the risks, benefits and alternatives for the proposed anesthesia with the patient or authorized representative who has indicated his/her understanding and acceptance.     Plan Discussed with: CRNA  Anesthesia Plan Comments:         Anesthesia Quick Evaluation

## 2016-01-27 NOTE — H&P (Signed)
Primary Care Physician:  Lavera Guise, MD Primary Gastroenterologist:  Dr. Vira Agar  Pre-Procedure History & Physical: HPI:  Meghan [name] is a 70 y.o. female is here for an colonoscopy.   Past Medical History  Diagnosis Date  . COPD (chronic obstructive pulmonary disease) ([place])   . Diabetes mellitus   . Hypertension   . Depression   . Anxiety   . Arthritis     RA  . GERD (gastroesophageal reflux disease)   . Osteopenia   . DDD (degenerative disc disease)   . Obese   . PTSD (post-traumatic stress disorder)   . Asthma   . Coronary artery disease   . Heart murmur   . Brain tumor (benign) ([place])   . Headache   . Obesity   . Lumbar degenerative disc disease   . Osteoporosis   . Pneumonia   . Migraines   . Anginal pain ([place])   . Heart murmur   . Hypercholesteremia     Past Surgical History  Procedure Laterality Date  . Hand reconstruction  2011    right-multiple finger joint reconst  . Abdominal hysterectomy    . Cervical fusion    . Joint replacement      bilat knee replacements  . Cholecystectomy    . Foot arthroplasty      toes x2 rt foot  . Finger arthroplasty  02/23/2012    Procedure: FINGER ARTHROPLASTY;  Surgeon: Cammie Sickle., MD;  Location: [facility];  Service: Orthopedics;  Laterality: Left;  Extensor carpi radialis longus to Extensor carpi ulnaris transfer, left Metaphalangeal reconstructions of index and long fingers,  . Colonoscopy with propofol N/A 09/20/2015    Procedure: COLONOSCOPY WITH PROPOFOL;  Surgeon: Josefine Class, MD;  Location: [facility] ENDOSCOPY;  Service: Endoscopy;  Laterality: N/A;    Prior to Admission medications   Medication Sig Start Date End Date Taking? Authorizing Provider  acyclovir ointment (ZOVIRAX) 5 % Apply topically every 3 (three) hours. Use as needed for blistering. 03/09/15  Yes Aldean Jewett, MD  albuterol (PROVENTIL HFA;VENTOLIN HFA) 108 (90 BASE) MCG/ACT inhaler Inhale 2 puffs into the lungs  every 6 (six) hours as needed for wheezing or shortness of breath.    Yes Historical Provider, MD  alendronate (FOSAMAX) 70 MG tablet Take 70 mg by mouth once a week. Pt takes on Monday.   Yes Historical Provider, MD  ALPRAZolam Duanne Moron) 0.5 MG tablet Take 1 tablet (0.5 mg total) by mouth at bedtime as needed for anxiety. 01/16/16  Yes Rainey Pines, MD  azelastine (ASTELIN) 0.1 % nasal spray Place 1 spray into both nostrils 2 (two) times daily as needed for rhinitis.    Yes Historical Provider, MD  azithromycin (ZITHROMAX) 250 MG tablet Take one tablet per day. 03/09/15  Yes Aldean Jewett, MD  benzonatate (TESSALON) 200 MG capsule Take 1 capsule (200 mg total) by mouth 3 (three) times daily as needed for cough. 03/09/15  Yes Aldean Jewett, MD  budesonide-formoterol [facility]) 80-4.5 MCG/ACT inhaler Inhale 2 puffs into the lungs 2 (two) times daily.   Yes Historical Provider, MD  butalbital-acetaminophen-caffeine (FIORICET) 50-325-40 MG tablet Take 1-2 tablets by mouth every 6 (six) hours as needed for headache. 09/08/15 09/07/16 Yes Lavonia Drafts, MD  cephALEXin (KEFLEX) 500 MG capsule Take 1 capsule (500 mg total) by mouth 4 (four) times daily. 12/21/15  Yes Sable Feil, PA-C  Cholecalciferol (VITAMIN D) 2000 UNITS CAPS Take 1 capsule by mouth  daily.   Yes Historical Provider, MD  cloNIDine (CATAPRES) 0.1 MG tablet Take 0.1 mg by mouth 2 (two) times daily.   Yes Historical Provider, MD  diphenhydrAMINE (BENADRYL) 25 MG tablet Take 25 mg by mouth every 6 (six) hours as needed for allergies.   Yes Historical Provider, MD  escitalopram (LEXAPRO) 10 MG tablet Take 1 tablet (10 mg total) by mouth every morning. 01/16/16  Yes Rainey Pines, MD  esomeprazole (NEXIUM) 40 MG capsule Take 40 mg by mouth at bedtime.   Yes Historical Provider, MD  fluconazole (DIFLUCAN) 150 MG tablet  02/19/15  Yes Historical Provider, MD  HYDROcodone-acetaminophen (NORCO/VICODIN) 5-325 MG per tablet Take 1 tablet by mouth every 4  (four) hours as needed for moderate pain. 05/04/15  Yes Cari B Triplett, FNP  insulin glargine (LANTUS) 100 UNIT/ML injection Inject 0.15 mLs (15 Units total) into the skin at bedtime. 03/10/15  Yes Aldean Jewett, MD  insulin starter kit- pen needles MISC 1 kit by Other route once. 03/09/15  Yes Aldean Jewett, MD  isosorbide mononitrate (IMDUR) 30 MG 24 hr tablet Take 30 mg by mouth daily.   Yes Historical Provider, MD  ketoconazole (NIZORAL) 2 % cream Apply 1 application topically at bedtime.   Yes Historical Provider, MD  lamoTRIgine (LAMICTAL) 100 MG tablet Take 1 tablet (100 mg total) by mouth daily. 01/16/16  Yes Rainey Pines, MD  levocetirizine (XYZAL) 5 MG tablet Take 5 mg by mouth daily.    Yes Historical Provider, MD  Lidocaine 4 % PTCH Apply 1 patch topically daily. Remove after 12 hours. No more than 3 patches at a time 05/04/15  Yes Cari B Triplett, FNP  meloxicam (MOBIC) 7.5 MG tablet Take 7.5 mg by mouth daily as needed for pain.    Yes Historical Provider, MD  nystatin (MYCOSTATIN) 100000 UNIT/ML suspension  02/19/15  Yes Historical Provider, MD  ondansetron (ZOFRAN-ODT) 8 MG disintegrating tablet Take 8 mg by mouth 3 (three) times daily as needed for nausea or vomiting.   Yes Historical Provider, MD  ONE TOUCH ULTRA TEST test strip  02/19/15  Yes Historical Provider, MD  oxyCODONE-acetaminophen (ROXICET) 5-325 MG tablet Take 1 tablet by mouth every 8 (eight) hours as needed for moderate pain. 12/21/15  Yes Sable Feil, PA-C  polyethylene glycol-electrolytes (NULYTELY/GOLYTELY) 420 G solution Take by mouth. 07/08/15  Yes Historical Provider, MD  potassium chloride SA (K-DUR,KLOR-CON) 20 MEQ tablet  03/18/15  Yes Historical Provider, MD  predniSONE (DELTASONE) 5 MG tablet Take 5 mg by mouth at bedtime.    Yes Historical Provider, MD  pregabalin (LYRICA) 50 MG capsule Take 50 mg by mouth 2 (two) times daily.   Yes Historical Provider, MD  promethazine (PHENERGAN) 25 MG tablet  04/04/15   Yes Historical Provider, MD  saxagliptin HCl (ONGLYZA) 5 MG TABS tablet Take 5 mg by mouth at bedtime.   Yes Historical Provider, MD  sulfamethoxazole-trimethoprim (BACTRIM DS,SEPTRA DS) 800-160 MG per tablet  03/29/15  Yes Historical Provider, MD  Syringe, Disposable, 1 ML MISC 300 Syringes by Does not apply route daily. 03/11/15  Yes Aldean Jewett, MD  Tofacitinib Citrate 5 MG TABS Take 1 tablet by mouth 2 (two) times daily.    Yes Historical Provider, MD    Allergies as of 01/09/2016 - Review Complete 12/21/2015  Allergen Reaction Noted  . Gabapentin Other (See Comments) 11/16/2014  . Iodine Other (See Comments) 01/26/2012  . Naproxen Hives 01/26/2012  . Nsaids Other (See Comments)  11/16/2014  . Tramadol Itching 05/04/2015    Family History  Problem Relation Age of Onset  . Depression Sister   . CAD    . Hypertension    . Diabetes Mellitus II    . Arthritis    . Hypertension Mother   . Arthritis/Rheumatoid Mother   . Heart attack Father   . Hypertension Father     Social History   Social History  . Marital Status: Divorced    Spouse Name: N/A  . Number of Children: N/A  . Years of Education: N/A   Occupational History  . Not on file.   Social History Main Topics  . Smoking status: Never Smoker   . Smokeless tobacco: Never Used  . Alcohol Use: No  . Drug Use: No  . Sexual Activity: No   Other Topics Concern  . Not on file   Social History Narrative    Review of Systems: See HPI, otherwise negative ROS  Physical Exam: BP 136/87 mmHg  Pulse 97  Temp(Src) 96.4 F (35.8 C) (Tympanic)  Resp 16  Ht 4' 9"  (1.448 m)  Wt 66.679 kg (147 lb)  BMI 31.80 kg/m2  SpO2 99% General:   Alert,  pleasant and cooperative in NAD Head:  Normocephalic and atraumatic. Neck:  Supple; no masses or thyromegaly. Lungs:  Clear throughout to auscultation.    Heart:  Regular rate and rhythm. Abdomen:  Soft, nontender and nondistended. Normal bowel sounds, without guarding,  and without rebound.   Neurologic:  Alert and  oriented x4;  grossly normal neurologically.  Impression/Plan: Meghan [name] is here for an colonoscopy to be performed for generalized abdominal pain  Risks, benefits, limitations, and alternatives regarding  colonoscopy have been reviewed with the patient.  Questions have been answered.  All parties agreeable.   Gaylyn Cheers, MD  01/27/2016, 8:42 AM

## 2016-01-27 NOTE — Op Note (Addendum)
[facility] Gastroenterology [name] Procedure Date: 01/27/2016 8:36 AM MRN: [id number] Account #: [id number] Date of Birth: 03-26-1946 Admit Type: Outpatient Age: 70 Room: [facility] ENDO ROOM 1 Gender: Female Note Status: Finalized Procedure:            Colonoscopy Indications:          Generalized abdominal pain Providers:            Manya Silvas, MD Referring MD:         Lavera Guise, MD (Referring MD) Medicines:            Propofol per Anesthesia Complications:        No immediate complications. Procedure:            Pre-Anesthesia Assessment:                       - After reviewing the risks and benefits, the patient                        was deemed in satisfactory condition to undergo the                        procedure.                       After obtaining informed consent, the colonoscope was                        passed under direct vision. Throughout the procedure,                        the patient's blood pressure, pulse, and oxygen                        saturations were monitored continuously. The                        Colonoscope was introduced through the anus and                        advanced to the the cecum, identified by appendiceal                        orifice and ileocecal valve. The colonoscopy was                        performed without difficulty. The patient tolerated the                        procedure well. The quality of the bowel preparation                        was excellent. Findings:      Three sessile polyps were found in the ascending colon. The polyps were       diminutive in size. These polyps were removed with a jumbo cold forceps.       Resection and retrieval were complete.      A small polyp was found in the ascending colon. The polyp was sessile.       The polyp was removed with a hot snare. Resection and retrieval were  complete.      A diminutive polyp was found in the cecum. The  polyp was sessile. The       polyp was removed with a jumbo cold forceps. Resection and retrieval       were complete.      Two sessile polyps were found in the transverse colon. The polyps were       diminutive in size. These polyps were removed with a cold biopsy       forceps. Resection and retrieval were complete.      Internal hemorrhoids were found during endoscopy. The hemorrhoids were       small and Grade I (internal hemorrhoids that do not prolapse). Impression:           - Three diminutive polyps in the ascending colon,                        removed with a jumbo cold forceps. Resected and                        retrieved.                       - One small polyp in the ascending colon, removed with                        a hot snare. Resected and retrieved.                       - One diminutive polyp in the cecum, removed with a                        jumbo cold forceps. Resected and retrieved.                       - Two diminutive polyps in the transverse colon,                        removed with a cold biopsy forceps. Resected and                        retrieved.                       - Internal hemorrhoids. Recommendation:       - Await pathology results. Manya Silvas, MD 01/27/2016 9:17:33 AM This report has been signed electronically. Number of Addenda: 0 Note Initiated On: 01/27/2016 8:36 AM Scope Withdrawal Time: 0 hours 17 minutes 23 seconds  Total Procedure Duration: 0 hours 23 minutes 57 seconds       [facility]

## 2016-01-28 ENCOUNTER — Encounter: Payer: Self-pay | Admitting: Unknown Physician Specialty

## 2016-01-28 LAB — SURGICAL PATHOLOGY

## 2016-01-30 DIAGNOSIS — E119 Type 2 diabetes mellitus without complications: Secondary | ICD-10-CM | POA: Diagnosis not present

## 2016-02-07 ENCOUNTER — Ambulatory Visit: Payer: Medicare Other | Attending: Student

## 2016-02-18 ENCOUNTER — Ambulatory Visit: Payer: 59 | Admitting: Psychiatry

## 2016-02-24 DIAGNOSIS — I10 Essential (primary) hypertension: Secondary | ICD-10-CM | POA: Diagnosis not present

## 2016-02-24 DIAGNOSIS — J449 Chronic obstructive pulmonary disease, unspecified: Secondary | ICD-10-CM | POA: Diagnosis not present

## 2016-02-24 DIAGNOSIS — E119 Type 2 diabetes mellitus without complications: Secondary | ICD-10-CM | POA: Diagnosis not present

## 2016-02-24 DIAGNOSIS — R0602 Shortness of breath: Secondary | ICD-10-CM | POA: Diagnosis not present

## 2016-02-25 ENCOUNTER — Ambulatory Visit (INDEPENDENT_AMBULATORY_CARE_PROVIDER_SITE_OTHER): Payer: 59 | Admitting: Psychiatry

## 2016-02-25 ENCOUNTER — Encounter: Payer: Self-pay | Admitting: Psychiatry

## 2016-02-25 VITALS — BP 118/78 | HR 92 | Temp 97.6°F | Ht <= 58 in | Wt 149.4 lb

## 2016-02-25 DIAGNOSIS — F411 Generalized anxiety disorder: Secondary | ICD-10-CM

## 2016-02-25 DIAGNOSIS — F3160 Bipolar disorder, current episode mixed, unspecified: Secondary | ICD-10-CM | POA: Diagnosis not present

## 2016-02-25 MED ORDER — LAMOTRIGINE 100 MG PO TABS: 100.0000 mg | ORAL_TABLET | Freq: Every day | ORAL | Status: DC

## 2016-02-25 MED ORDER — ESCITALOPRAM OXALATE 10 MG PO TABS: 10.0000 mg | ORAL_TABLET | Freq: Every morning | ORAL | Status: DC

## 2016-02-25 MED ORDER — ALPRAZOLAM 0.25 MG PO TABS: 0.2500 mg | ORAL_TABLET | Freq: Every evening | ORAL | Status: DC | PRN

## 2016-02-25 NOTE — Progress Notes (Signed)
[place] MD/PA/NP OP Progress Note  02/25/2016 2:57 PM Meghan [name]  MRN:  [id number]  Subjective:   Meghan [name]  is a 70 year old female who presented for the follow-up appointment. She reported that she is very concerned about her 41-year-old granddaughter who might be having some medical issues. She reported that she is going for testing for cancer. She reported that she was also recently started on Reglan and patient has been experiencing visual hallucinations for the past 2 weeks. Patient reported that it was given to her due to GI problems. Patient reported that she has been seeing bugs crawling in her floor especially at night. Patient reported that she has been compliant with her medications. Her memory is getting worse and we discussed about decreasing the dose of Xanax and she agreed with the plan. She is compliant with her medications. She currently denied having any suicidal homicidal ideations or plans. She has good relationship with her family members at this time.    She denied having any suicidal ideations or plans  Chief Complaint:  Chief Complaint    Follow-up; Medication Refill     Visit Diagnosis:     ICD-9-CM ICD-10-CM   1. Bipolar I disorder, most recent episode mixed ([place]) 296.60 F31.60   2. Anxiety state 300.00 F41.1     Past Medical History:  Past Medical History  Diagnosis Date  . COPD (chronic obstructive pulmonary disease) ([place])   . Diabetes mellitus   . Hypertension   . Depression   . Anxiety   . Arthritis     RA  . GERD (gastroesophageal reflux disease)   . Osteopenia   . DDD (degenerative disc disease)   . Obese   . PTSD (post-traumatic stress disorder)   . Asthma   . Coronary artery disease   . Heart murmur   . Brain tumor (benign) ([place])   . Headache   . Obesity   . Lumbar degenerative disc disease   . Osteoporosis   . Pneumonia   . Migraines   . Anginal pain ([place])   . Heart murmur   . Hypercholesteremia     Past Surgical History  Procedure  Laterality Date  . Hand reconstruction  2011    right-multiple finger joint reconst  . Abdominal hysterectomy    . Cervical fusion    . Joint replacement      bilat knee replacements  . Cholecystectomy    . Foot arthroplasty      toes x2 rt foot  . Finger arthroplasty  02/23/2012    Procedure: FINGER ARTHROPLASTY;  Surgeon: Cammie Sickle., MD;  Location: [facility] [place];  Service: Orthopedics;  Laterality: Left;  Extensor carpi radialis longus to Extensor carpi ulnaris transfer, left Metaphalangeal reconstructions of index and long fingers,  . Colonoscopy with propofol N/A 09/20/2015    Procedure: COLONOSCOPY WITH PROPOFOL;  Surgeon: Josefine Class, MD;  Location: [facility] ENDOSCOPY;  Service: Endoscopy;  Laterality: N/A;  . Colonoscopy with propofol N/A 01/27/2016    Procedure: COLONOSCOPY WITH PROPOFOL;  Surgeon: Manya Silvas, MD;  Location: [facility] ENDOSCOPY;  Service: Endoscopy;  Laterality: N/A;   Family History:  Family History  Problem Relation Age of Onset  . Depression Sister   . CAD    . Hypertension    . Diabetes Mellitus II    . Arthritis    . Hypertension Mother   . Arthritis/Rheumatoid Mother   . Heart attack Father   . Hypertension Father  Social History:  Social History   Social History  . Marital Status: Divorced    Spouse Name: N/A  . Number of Children: N/A  . Years of Education: N/A   Social History Main Topics  . Smoking status: Never Smoker   . Smokeless tobacco: Never Used  . Alcohol Use: No  . Drug Use: No  . Sexual Activity: No   Other Topics Concern  . None   Social History Narrative   Additional History:  Patient currently lives with her family members   Assessment:   Musculoskeletal: Strength & Muscle Tone: within normal limits Gait & Station: normal Patient leans: N/A  Psychiatric Specialty Exam: Anxiety Symptoms include dizziness and nervous/anxious behavior. Patient reports no nausea or palpitations.       Review of Systems  Constitutional: Negative for chills.  HENT: Negative for hearing loss.   Eyes: Negative for photophobia.  Respiratory: Negative for sputum production.   Cardiovascular: Negative for palpitations.  Gastrointestinal: Negative for nausea.  Genitourinary: Negative for frequency.  Musculoskeletal: Positive for back pain.  Skin: Negative for rash.  Neurological: Positive for dizziness. Negative for tingling.  Endo/Heme/Allergies: Negative for environmental allergies.  Psychiatric/Behavioral: Positive for depression. Negative for hallucinations. The patient is nervous/anxious.   All other systems reviewed and are negative.   Blood pressure 118/78, pulse 92, temperature 97.6 F (36.4 C), temperature source Tympanic, height 4' 9"  (1.448 m), weight 149 lb 6.4 oz (67.767 kg), SpO2 91 %.Body mass index is 32.32 kg/(m^2).  General Appearance: Casual  Eye Contact:  Fair  Speech:  Clear and Coherent  Volume:  Normal  Mood:  Anxious  Affect:  Congruent  Thought Process:  Goal Directed  Orientation:  Full (Time, Place, and Person)  Thought Content:  Hallucinations: Visual  Suicidal Thoughts:  No  Homicidal Thoughts:  No  Memory:  Immediate;   Fair  Judgement:  Fair  Insight:  Fair  Psychomotor Activity:  Normal  Concentration:  Fair  Recall:  [employer] of Knowledge: Fair  Language: Fair  Akathisia:  No  Handed:  Right  AIMS (if indicated):  none  Assets:  Communication Skills Desire for Improvement Physical Health  ADL's:  Intact  Cognition: WNL  Sleep:  6-7    Is the patient at risk to self?  No. Has the patient been a risk to self in the past 6 months?  No. Has the patient been a risk to self within the distant past?  No. Is the patient a risk to others?  No. Has the patient been a risk to others in the past 6 months?  No. Has the patient been a risk to others within the distant past?  No.  Current Medications: Current Outpatient Prescriptions  Medication  Sig Dispense Refill  . acyclovir ointment (ZOVIRAX) 5 % Apply topically every 3 (three) hours. Use as needed for blistering. 5 g 0  . albuterol (PROVENTIL HFA;VENTOLIN HFA) 108 (90 BASE) MCG/ACT inhaler Inhale 2 puffs into the lungs every 6 (six) hours as needed for wheezing or shortness of breath.     Marland Kitchen alendronate (FOSAMAX) 70 MG tablet Take 70 mg by mouth once a week. Pt takes on Monday.    . ALPRAZolam (XANAX) 0.5 MG tablet Take 1 tablet (0.5 mg total) by mouth at bedtime as needed for anxiety. 30 tablet 2  . azelastine (ASTELIN) 0.1 % nasal spray Place 1 spray into both nostrils 2 (two) times daily as needed for rhinitis.     Marland Kitchen  azithromycin (ZITHROMAX) 250 MG tablet Take one tablet per day. 4 each 0  . benzonatate (TESSALON) 200 MG capsule Take 1 capsule (200 mg total) by mouth 3 (three) times daily as needed for cough. 20 capsule 0  . budesonide-formoterol (SYMBICORT) 80-4.5 MCG/ACT inhaler Inhale 2 puffs into the lungs 2 (two) times daily.    . butalbital-acetaminophen-caffeine (FIORICET) 50-325-40 MG tablet Take 1-2 tablets by mouth every 6 (six) hours as needed for headache. 20 tablet 0  . cephALEXin (KEFLEX) 500 MG capsule Take 1 capsule (500 mg total) by mouth 4 (four) times daily. 40 capsule 0  . Cholecalciferol (VITAMIN D) 2000 UNITS CAPS Take 1 capsule by mouth daily.    . ciclopirox (PENLAC) 8 % solution     . cloNIDine (CATAPRES) 0.1 MG tablet Take 0.1 mg by mouth 2 (two) times daily.    . diphenhydrAMINE (BENADRYL) 25 MG tablet Take 25 mg by mouth every 6 (six) hours as needed for allergies.    Marland Kitchen escitalopram (LEXAPRO) 10 MG tablet Take 1 tablet (10 mg total) by mouth every morning. 30 tablet 1  . esomeprazole (NEXIUM) 40 MG capsule Take 40 mg by mouth at bedtime.    . fluconazole (DIFLUCAN) 150 MG tablet     . HYDROcodone-acetaminophen (NORCO/VICODIN) 5-325 MG per tablet Take 1 tablet by mouth every 4 (four) hours as needed for moderate pain. 12 tablet 0  . insulin glargine  (LANTUS) 100 UNIT/ML injection Inject 0.15 mLs (15 Units total) into the skin at bedtime. 10 mL 11  . insulin starter kit- pen needles MISC 1 kit by Other route once. 1 kit 0  . isosorbide mononitrate (IMDUR) 30 MG 24 hr tablet Take 30 mg by mouth daily.    Marland Kitchen ketoconazole (NIZORAL) 2 % cream Apply 1 application topically at bedtime.    . lamoTRIgine (LAMICTAL) 100 MG tablet Take 1 tablet (100 mg total) by mouth daily. 90 tablet 3  . levocetirizine (XYZAL) 5 MG tablet Take 5 mg by mouth daily.     . Lidocaine 4 % PTCH Apply 1 patch topically daily. Remove after 12 hours. No more than 3 patches at a time 15 patch 0  . meloxicam (MOBIC) 7.5 MG tablet Take 7.5 mg by mouth daily as needed for pain.     Marland Kitchen metoCLOPramide (REGLAN) 10 MG tablet     . nystatin (MYCOSTATIN) 100000 UNIT/ML suspension     . ondansetron (ZOFRAN-ODT) 8 MG disintegrating tablet Take 8 mg by mouth 3 (three) times daily as needed for nausea or vomiting.    . ONE TOUCH ULTRA TEST test strip     . oxyCODONE-acetaminophen (ROXICET) 5-325 MG tablet Take 1 tablet by mouth every 8 (eight) hours as needed for moderate pain. 12 tablet 0  . polyethylene glycol-electrolytes (NULYTELY/GOLYTELY) 420 G solution Take by mouth.    . potassium chloride SA (K-DUR,KLOR-CON) 20 MEQ tablet     . predniSONE (DELTASONE) 5 MG tablet Take 5 mg by mouth at bedtime.     . pregabalin (LYRICA) 50 MG capsule Take 50 mg by mouth 2 (two) times daily.    . promethazine (PHENERGAN) 25 MG tablet     . saxagliptin HCl (ONGLYZA) 5 MG TABS tablet Take 5 mg by mouth at bedtime.    . sulfamethoxazole-trimethoprim (BACTRIM DS,SEPTRA DS) 800-160 MG per tablet     . Syringe, Disposable, 1 ML MISC 300 Syringes by Does not apply route daily. 300 each 0  . Tofacitinib Citrate 5 MG TABS  Take 1 tablet by mouth 2 (two) times daily.      No current facility-administered medications for this visit.    Medical Decision Making:  Established Problem, Stable/Improving (1),  Review and summation of old records (2) and Review of Last Therapy Session (1)  Treatment Plan Summary:Medication management  Discussed with patient about the medications  She  will continue on Lexapro 10 mg in the morning and lamotrigine 100 mg at bedtime  She will continue on Xanax 0.25 mg at bedtime on a when necessary basis. She was given 15 day supply of the medications.  Advised patient that she is having visual hallucinations related to Reglan and she demonstrated understanding. She will call her primary care physician about the same. Follow-up in 2 weeks or earlier depending on her symptoms.    More than 50% of the time spent in psychoeducation, counseling and coordination of care.    This note was generated in part or whole with voice recognition software. Voice regonition is usually quite accurate but there are transcription errors that can and very often do occur. I apologize for any typographical errors that were not detected and corrected.   Rainey Pines, MD  02/25/2016, 2:57 PM

## 2016-02-28 ENCOUNTER — Emergency Department
Admission: EM | Admit: 2016-02-28 | Discharge: 2016-02-29 | Disposition: A | Discharge status: Home / Self Care | Payer: Medicare Other | Attending: Emergency Medicine | Admitting: Emergency Medicine

## 2016-02-28 ENCOUNTER — Encounter: Payer: Self-pay | Admitting: Emergency Medicine

## 2016-02-28 DIAGNOSIS — J45909 Unspecified asthma, uncomplicated: Secondary | ICD-10-CM | POA: Diagnosis not present

## 2016-02-28 DIAGNOSIS — S43401A Unspecified sprain of right shoulder joint, initial encounter: Secondary | ICD-10-CM | POA: Insufficient documentation

## 2016-02-28 DIAGNOSIS — X58XXXA Exposure to other specified factors, initial encounter: Secondary | ICD-10-CM | POA: Diagnosis not present

## 2016-02-28 DIAGNOSIS — E119 Type 2 diabetes mellitus without complications: Secondary | ICD-10-CM | POA: Insufficient documentation

## 2016-02-28 DIAGNOSIS — Y929 Unspecified place or not applicable: Secondary | ICD-10-CM | POA: Diagnosis not present

## 2016-02-28 DIAGNOSIS — Z7951 Long term (current) use of inhaled steroids: Secondary | ICD-10-CM | POA: Insufficient documentation

## 2016-02-28 DIAGNOSIS — Z794 Long term (current) use of insulin: Secondary | ICD-10-CM | POA: Insufficient documentation

## 2016-02-28 DIAGNOSIS — Y939 Activity, unspecified: Secondary | ICD-10-CM | POA: Insufficient documentation

## 2016-02-28 DIAGNOSIS — M546 Pain in thoracic spine: Secondary | ICD-10-CM | POA: Diagnosis not present

## 2016-02-28 DIAGNOSIS — Y999 Unspecified external cause status: Secondary | ICD-10-CM | POA: Insufficient documentation

## 2016-02-28 DIAGNOSIS — S134XXA Sprain of ligaments of cervical spine, initial encounter: Secondary | ICD-10-CM | POA: Diagnosis not present

## 2016-02-28 DIAGNOSIS — S239XXA Sprain of unspecified parts of thorax, initial encounter: Secondary | ICD-10-CM

## 2016-02-28 DIAGNOSIS — S139XXA Sprain of joints and ligaments of unspecified parts of neck, initial encounter: Secondary | ICD-10-CM

## 2016-02-28 DIAGNOSIS — S238XXA Sprain of other specified parts of thorax, initial encounter: Secondary | ICD-10-CM | POA: Insufficient documentation

## 2016-02-28 DIAGNOSIS — I10 Essential (primary) hypertension: Secondary | ICD-10-CM | POA: Insufficient documentation

## 2016-02-28 DIAGNOSIS — Z79899 Other long term (current) drug therapy: Secondary | ICD-10-CM | POA: Diagnosis not present

## 2016-02-28 DIAGNOSIS — M542 Cervicalgia: Secondary | ICD-10-CM | POA: Diagnosis not present

## 2016-02-28 DIAGNOSIS — M25511 Pain in right shoulder: Secondary | ICD-10-CM | POA: Diagnosis not present

## 2016-02-28 DIAGNOSIS — J449 Chronic obstructive pulmonary disease, unspecified: Secondary | ICD-10-CM | POA: Diagnosis not present

## 2016-02-28 NOTE — ED Notes (Signed)
Patient reports she has been helping who brother who has several fractures.  She was helping him into bed and now has pain in her neck, shoulders and down to lower back since.

## 2016-02-29 ENCOUNTER — Emergency Department: Payer: Medicare Other

## 2016-02-29 DIAGNOSIS — S43401A Unspecified sprain of right shoulder joint, initial encounter: Secondary | ICD-10-CM | POA: Diagnosis not present

## 2016-02-29 DIAGNOSIS — M546 Pain in thoracic spine: Secondary | ICD-10-CM | POA: Diagnosis not present

## 2016-02-29 DIAGNOSIS — M25511 Pain in right shoulder: Secondary | ICD-10-CM | POA: Diagnosis not present

## 2016-02-29 DIAGNOSIS — M542 Cervicalgia: Secondary | ICD-10-CM | POA: Diagnosis not present

## 2016-02-29 MED ORDER — ONDANSETRON 4 MG PO TBDP
4.0000 mg | ORAL_TABLET | Freq: Once | ORAL | Status: AC
  Administered 2016-02-29: 4 mg via ORAL
  Filled 2016-02-29: qty 1

## 2016-02-29 MED ORDER — HYDROCODONE-ACETAMINOPHEN 5-325 MG PO TABS
2.0000 | ORAL_TABLET | Freq: Once | ORAL | Status: AC
  Administered 2016-02-29: 2 via ORAL
  Filled 2016-02-29: qty 2

## 2016-02-29 MED ORDER — HYDROCODONE-ACETAMINOPHEN 5-325 MG PO TABS
1.0000 | ORAL_TABLET | ORAL | Status: DC | PRN
Start: 2016-02-29 — End: 2016-05-05

## 2016-02-29 NOTE — ED Provider Notes (Signed)
  [facility] Emergency Department Provider Note  ___________________________________________  Time seen: Approximately 12:08 AM  I have reviewed the triage vital signs and the nursing notes.   HISTORY  Chief Complaint Shoulder Pain and Torticollis    HPI Meghan [name] is a 69 y.o. female who is complaining that she had since family member that was recently injured and has been trying to help him move them being lifted. Patient states she is having severe pain in the right side of her neck her right posterior shoulder and her upper mid back after moving him a couple times. Patient has had surgery on her neck, and is concerned about that. Patient states that at this point she is having severe pain and nothing that she's taken at home has helped. She denies any numbness tingling or weakness to her extremities. Patient states the pain on scale of 0-10 is about a 7, but she does not want to be given any pain medicine until after her x-rays.   Past Medical History  Diagnosis Date  . COPD (chronic obstructive pulmonary disease) (HCC)   . Diabetes mellitus   . Hypertension   . Depression   . Anxiety   . Arthritis     RA  . GERD (gastroesophageal reflux disease)   . Osteopenia   . DDD (degenerative disc disease)   . Obese   . PTSD (post-traumatic stress disorder)   . Asthma   . Coronary artery disease   . Heart murmur   . Brain tumor (benign) (HCC)   . Headache   . Obesity   . Lumbar degenerative disc disease   . Osteoporosis   . Pneumonia   . Migraines   . Anginal pain (HCC)   . Heart murmur   . Hypercholesteremia     Patient Active Problem List   Diagnosis Date Noted  . Seizure (HCC) 01/03/2016  . Chronic tension-type headache, intractable 12/04/2015  . 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 Degeneration of intervertebral disc of lumbar region 07/15/2015  . Seropositive rheumatoid arthritis (HCC) 07/15/2015  . Asthma with acute exacerbation  03/10/2015  . Hypokalemia 03/01/2015  . Hyponatremia 03/01/2015  . DDD (degenerative disc disease), lumbar 01/31/2015  . Arthritis, degenerative 01/31/2015  . Rheumatoid arthritis with rheumatoid factor (HCC) 01/31/2015  . Diabetes mellitus, type II (HCC) 12/24/2014  . HTN (hypertension) 12/24/2014  . Sepsis (HCC) 12/24/2014  . Left knee pain 12/24/2014  . GERD (gastroesophageal reflux disease) 12/24/2014  . COPD (chronic obstructive pulmonary disease) (HCC) 12/24/2014  . Depression 12/24/2014  . Anxiety 12/24/2014  . Severe bipolar disorder with psychotic features, mood-congruent (HCC) 11/16/2014  . Neurosis, posttraumatic 11/16/2014  . H/O diabetes mellitus 11/16/2014  . H/O: HTN (hypertension) 11/16/2014  . H/O gastric ulcer 11/16/2014  . H/O: obesity 11/16/2014  . H/O arthritis 11/16/2014  . Barton's fracture of distal radius, closed 09/19/2014  . Neuritis or radiculitis due to rupture of lumbar intervertebral disc 05/11/2014  . Cervico-occipital neuralgia 03/26/2014  . Difficulty in walking 03/26/2014  . Difficulty in walking, not elsewhere classified 03/26/2014  . Cervical spine syndrome 01/26/2014  . Cephalalgia 01/09/2014  . Disordered sleep 01/09/2014  . Diabetes (HCC) 10/19/2013  . BP (high blood pressure) 10/19/2013  . Adiposity 10/19/2013  . Cardiac murmur 10/19/2013  . PNA (pneumonia) 10/19/2013  . Breath shortness 10/19/2013  . Chronic obstructive pulmonary disease (HCC) 10/19/2013  . Diabetes mellitus (HCC) 10/19/2013    Past Surgical History  Procedure Laterality Date  . Hand reconstruction    2011    right-multiple finger joint reconst  . Abdominal hysterectomy    . Cervical fusion    . Joint replacement      bilat knee replacements  . Cholecystectomy    . Foot arthroplasty      toes x2 rt foot  . Finger arthroplasty  02/23/2012    Procedure: FINGER ARTHROPLASTY;  Surgeon: Cammie Sickle., MD;  Location: [facility];  Service:  Orthopedics;  Laterality: Left;  Extensor carpi radialis longus to Extensor carpi ulnaris transfer, left Metaphalangeal reconstructions of index and long fingers,  . Colonoscopy with propofol N/A 09/20/2015    Procedure: COLONOSCOPY WITH PROPOFOL;  Surgeon: Josefine Class, MD;  Location: [facility] ENDOSCOPY;  Service: Endoscopy;  Laterality: N/A;  . Colonoscopy with propofol N/A 01/27/2016    Procedure: COLONOSCOPY WITH PROPOFOL;  Surgeon: Manya Silvas, MD;  Location: [facility] ENDOSCOPY;  Service: Endoscopy;  Laterality: N/A;    Current Outpatient Rx  Name  Route  Sig  Dispense  Refill  . acyclovir ointment (ZOVIRAX) 5 %   Topical   Apply topically every 3 (three) hours. Use as needed for blistering.   5 g   0   . albuterol (PROVENTIL HFA;VENTOLIN HFA) 108 (90 BASE) MCG/ACT inhaler   Inhalation   Inhale 2 puffs into the lungs every 6 (six) hours as needed for wheezing or shortness of breath.          Marland Kitchen alendronate (FOSAMAX) 70 MG tablet   Oral   Take 70 mg by mouth once a week. Pt takes on Monday.         . ALPRAZolam (XANAX) 0.25 MG tablet   Oral   Take 1 tablet (0.25 mg total) by mouth at bedtime as needed for anxiety.   15 tablet   0   . budesonide-formoterol (SYMBICORT) 80-4.5 MCG/ACT inhaler   Inhalation   Inhale 2 puffs into the lungs 2 (two) times daily.         . butalbital-acetaminophen-caffeine (FIORICET) 50-325-40 MG tablet   Oral   Take 1-2 tablets by mouth every 6 (six) hours as needed for headache.   20 tablet   0   . Cholecalciferol (VITAMIN D) 2000 UNITS CAPS   Oral   Take 1 capsule by mouth daily.         . ciclopirox (PENLAC) 8 % solution               . cloNIDine (CATAPRES) 0.1 MG tablet   Oral   Take 0.1 mg by mouth 2 (two) times daily.         Marland Kitchen escitalopram (LEXAPRO) 10 MG tablet   Oral   Take 1 tablet (10 mg total) by mouth every morning.   30 tablet   1   . esomeprazole (NEXIUM) 40 MG capsule   Oral   Take 40 mg by mouth at  bedtime.         . insulin glargine (LANTUS) 100 UNIT/ML injection   Subcutaneous   Inject 0.15 mLs (15 Units total) into the skin at bedtime.   10 mL   11   . insulin starter kit- pen needles MISC   Other   1 kit by Other route once.   1 kit   0   . isosorbide mononitrate (IMDUR) 30 MG 24 hr tablet   Oral   Take 30 mg by mouth daily.         Marland Kitchen ketoconazole (NIZORAL) 2 %  cream   Topical   Apply 1 application topically at bedtime.         . lamoTRIgine (LAMICTAL) 100 MG tablet   Oral   Take 1 tablet (100 mg total) by mouth daily.   90 tablet   3   . levocetirizine (XYZAL) 5 MG tablet   Oral   Take 5 mg by mouth daily.          . metoCLOPramide (REGLAN) 10 MG tablet               . nystatin (MYCOSTATIN) 100000 UNIT/ML suspension               . ONE TOUCH ULTRA TEST test strip                 Dispense as written.   . potassium chloride SA (K-DUR,KLOR-CON) 20 MEQ tablet               . predniSONE (DELTASONE) 5 MG tablet   Oral   Take 5 mg by mouth at bedtime.          . pregabalin (LYRICA) 50 MG capsule   Oral   Take 50 mg by mouth 2 (two) times daily.         . saxagliptin HCl (ONGLYZA) 5 MG TABS tablet   Oral   Take 5 mg by mouth at bedtime.         . Syringe, Disposable, 1 ML MISC   Does not apply   300 Syringes by Does not apply route daily.   300 each   0   . Tofacitinib Citrate 5 MG TABS   Oral   Take 1 tablet by mouth 2 (two) times daily.          Marland Kitchen HYDROcodone-acetaminophen (NORCO) 5-325 MG tablet   Oral   Take 1 tablet by mouth every 4 (four) hours as needed for moderate pain.   20 tablet   0     Allergies Gabapentin; Iodine; Naproxen; Nsaids; and Tramadol  Family History  Problem Relation Age of Onset  . Depression Sister   . CAD    . Hypertension    . Diabetes Mellitus II    . Arthritis    . Hypertension Mother   . Arthritis/Rheumatoid Mother   . Heart attack Father   . Hypertension Father      Social History Social History  Substance Use Topics  . Smoking status: Never Smoker   . Smokeless tobacco: Never Used  . Alcohol Use: No    Review of Systems Constitutional: No fever/chills Eyes: No visual changes. ENT: No sore throat. Cardiovascular: Denies chest pain. Respiratory: Denies shortness of breath. Gastrointestinal: No abdominal pain.  No nausea, no vomiting.  No diarrhea.  No constipation. Genitourinary: Negative for dysuria. Musculoskeletal: Positive for back pain. Positive for neck pain especially on the right side, and positive for pain in her upper mid back and her right shoulder with limited range of motion. Skin: Negative for rash. Neurological: Negative for headaches, focal weakness or numbness.  10-point ROS otherwise negative.  ____________________________________________   PHYSICAL EXAM:  VITAL SIGNS: ED Triage Vitals  Enc Vitals Group     BP 02/28/16 2202 191/93 mmHg     Pulse Rate 02/28/16 2202 91     Resp 02/28/16 2202 20     Temp 02/28/16 2202 98.2 F (36.8 C)     Temp Source 02/28/16 2202 Oral     SpO2 02/28/16 2202  99 %     Weight 02/28/16 2202 147 lb (66.679 kg)     Height 02/28/16 2202 4' 9.5" (1.461 m)     Head Cir --      Peak Flow --      Pain Score 02/28/16 2203 8     Pain Loc --      Pain Edu? --      Excl. in [place]? --     Constitutional: Alert and oriented. Well appearing and in Mild acute distress secondary to pain. Eyes: Conjunctivae are normal. PERRL. EOMI. Head: Atraumatic. Nose: No congestion/rhinnorhea. Mouth/Throat: Mucous membranes are moist.  Oropharynx non-erythematous. Neck: No stridor.  Positive tenderness palpation on her right paracervical muscle area from C1-C7, but no numbness tingling or difficulty with movement or weakness to her extremities. Cardiovascular: Normal rate, regular rhythm. Grossly normal heart sounds.  Good peripheral circulation. Respiratory: Normal respiratory effort.  No retractions.  Lungs CTAB. Gastrointestinal: Soft and nontender. No distention. No abdominal bruits. No CVA tenderness. Musculoskeletal: No lower extremity tenderness nor edema.  No joint effusions. Positive tenderness palpation along her upper thoracic spine from like T1-T7 along her right scapula and right proximal humerus. Patient with limited range of motion secondary to pain but she is able to fully range the shoulder. Neurologic:  Normal speech and language. No gross focal neurologic deficits are appreciated. No gait instability. Skin:  Skin is warm, dry and intact. No rash noted. Psychiatric: Mood and affect are normal. Speech and behavior are normal.  ____________________________________________   LABS (all labs ordered are listed, but only abnormal results are displayed)  Labs Reviewed - No data to display ____________________________________________  EKG   ____________________________________________  RADIOLOGY Dg Thoracic Spine 2 View  02/29/2016  CLINICAL DATA:  Severe back pain. EXAM: THORACIC SPINE 2 VIEWS COMPARISON:  03/01/2015 FINDINGS: No acute fracture or traumatic malalignment is noted. Vertebral body height is stable compared to previous chest x-ray. Exaggerated thoracic kyphosis and mild dextroscoliosis with mid and lower thoracic predominant degenerative disc narrowing and spondylosis. Hyperinflation and chronic eventration of the right diaphragm. IMPRESSION: 1. No acute finding with compared to prior. 2. Degenerative kyphoscoliosis. Electronically Signed   By: Monte Fantasia M.D.   On: 02/29/2016 01:33   Dg Shoulder Right  02/29/2016  CLINICAL DATA:  Right shoulder pain.  Initial encounter. EXAM: RIGHT SHOULDER - 2+ VIEW COMPARISON:  05/18/2008 FINDINGS: There is no evidence of fracture or dislocation. Glenohumeral osteoarthritis with spurring and joint narrowing. Acromioclavicular osteoarthritis with upward spurring. Osteopenia. IMPRESSION: 1. No acute finding. 2. Glenohumeral and  acromioclavicular osteoarthritis. Electronically Signed   By: Monte Fantasia M.D.   On: 02/29/2016 01:35   Ct Cervical Spine Wo Contrast  02/29/2016  CLINICAL DATA:  Neck pain extending into the shoulders and lower back after helping a family member into bed. Initial encounter. EXAM: CT CERVICAL SPINE WITHOUT CONTRAST TECHNIQUE: Multidetector CT imaging of the cervical spine was performed without intravenous contrast. Multiplanar CT image reconstructions were also generated. COMPARISON:  Cervical spine radiographs 01/12/2005 FINDINGS: There is chronic straightening of the normal cervical lordosis with slight anterolisthesis of C4 on C5 which appears degenerative and facet mediated. The there is solid intervertebral osseous fusion at C5-6 with left-sided facet ankylosis at this level as well. No acute cervical spine fracture is identified. Disc space narrowing is moderate at C3-4 in C4-5 and moderate to severe at C6-7 and T1-2. Uncovertebral spurring results in mild-to-moderate bilateral neural foraminal stenosis at C3-4 with disc bulging resulting in  mild spinal stenosis. There is also mild spinal stenosis and moderate left neural foraminal stenosis at C4-5 due to disc uncovering, uncovertebral spurring, and left facet arthrosis. Mild-to-moderate left foraminal stenosis is noted at C6-7 due to uncovertebral spurring. Sequelae of prior right suboccipital craniectomy are again identified with underlying cerebellar encephalomalacia. Left greater than right carotid bifurcation atherosclerosis is noted. IMPRESSION: 1. No evidence of acute osseous abnormality. 2. Solid C5-6 fusion. 3. Multilevel disc and facet degeneration with adjacent segment disease as above. Electronically Signed   By: Allen  Grady M.D.   On: 02/29/2016 01:17     ____________________________________________   PROCEDURES  Procedure(s) performed: None  Procedures  Critical Care performed:  No  ____________________________________________   INITIAL IMPRESSION / ASSESSMENT AND PLAN / ED COURSE  Pertinent labs & imaging results that were available during my care of the patient were reviewed by me and considered in my medical decision making (see chart for details).  1:42 AM Patient is CT of the head as well as routine x-rays of her back and shoulder at this time. Patient requests not to have pain medicine at this time.  1:42 AM Patient's x-rays were negative. Patient will be given some hydrocodone to take at home for the pain and she was told to follow-up her PMD on Monday for recheck. Patient was told to not do any heavy lifting and return immediately if condition worsens. ____________________________________________   FINAL CLINICAL IMPRESSION(S) / ED DIAGNOSES  Final diagnoses:  Cervical sprain, initial encounter  Thoracic sprain, initial encounter  Shoulder sprain, right, initial encounter      NEW MEDICATIONS STARTED DURING THIS VISIT:  New Prescriptions   HYDROCODONE-ACETAMINOPHEN (NORCO) 5-325 MG TABLET    Take 1 tablet by mouth every 4 (four) hours as needed for moderate pain.     Note:  This document was prepared using Dragon voice recognition software and may include unintentional dictation errors.     M , MD 02/29/16 0142 

## 2016-03-10 ENCOUNTER — Ambulatory Visit: Payer: 59 | Admitting: Psychiatry

## 2016-03-12 DIAGNOSIS — E1140 Type 2 diabetes mellitus with diabetic neuropathy, unspecified: Secondary | ICD-10-CM | POA: Diagnosis not present

## 2016-03-12 DIAGNOSIS — I10 Essential (primary) hypertension: Secondary | ICD-10-CM | POA: Diagnosis not present

## 2016-03-12 DIAGNOSIS — G2581 Restless legs syndrome: Secondary | ICD-10-CM | POA: Diagnosis not present

## 2016-03-12 DIAGNOSIS — M064 Inflammatory polyarthropathy: Secondary | ICD-10-CM | POA: Diagnosis not present

## 2016-03-12 DIAGNOSIS — D519 Vitamin B12 deficiency anemia, unspecified: Secondary | ICD-10-CM | POA: Diagnosis not present

## 2016-03-16 ENCOUNTER — Encounter: Payer: Self-pay | Admitting: Psychiatry

## 2016-03-16 ENCOUNTER — Ambulatory Visit (INDEPENDENT_AMBULATORY_CARE_PROVIDER_SITE_OTHER): Payer: 59 | Admitting: Psychiatry

## 2016-03-16 VITALS — BP 157/96 | HR 111 | Temp 98.8°F | Ht <= 58 in | Wt 145.8 lb

## 2016-03-16 DIAGNOSIS — F3160 Bipolar disorder, current episode mixed, unspecified: Secondary | ICD-10-CM

## 2016-03-16 DIAGNOSIS — F4312 Post-traumatic stress disorder, chronic: Secondary | ICD-10-CM | POA: Diagnosis not present

## 2016-03-16 DIAGNOSIS — F411 Generalized anxiety disorder: Secondary | ICD-10-CM

## 2016-03-16 MED ORDER — ALPRAZOLAM 0.25 MG PO TABS: 0.2500 mg | ORAL_TABLET | Freq: Every evening | ORAL | 1 refills | Status: DC | PRN

## 2016-03-16 MED ORDER — ESCITALOPRAM OXALATE 10 MG PO TABS: 10.0000 mg | ORAL_TABLET | Freq: Every morning | ORAL | 1 refills | Status: DC

## 2016-03-16 NOTE — Progress Notes (Signed)
[place] MD/PA/NP OP Progress Note  03/16/2016 11:10 AM TAMBRA MULLER  MRN:  [id number]  Subjective:   Meghan [name]  is a 70year-old female who presented for the follow-up appointment. She reported that she is doing well and she does not have any hallucinations at this time. She reported that she stopped taking the Reglan which was causing hallucinations and she is doing well. She reported that she is spending time with her family members. She reported that she continues to have leg cramps at night. We discussed about her medications at length. She is doing to have a physical examination done soon with her PCP. She reported that she has been spending time with her brother-in-law who has dementia and she is being very supportive with them. She appears very calm and collected during the interview. She is compliant with her medications.     She denied having any suicidal ideations or plans  Chief Complaint:  Chief Complaint    Medication Refill; Follow-up     Visit Diagnosis:     ICD-9-CM ICD-10-CM   1. Bipolar I disorder, most recent episode mixed ([place]) 296.60 F31.60   2. Anxiety state 300.00 F41.1   3. Chronic post-traumatic stress disorder (PTSD) 309.81 F43.12     Past Medical History:  Past Medical History:  Diagnosis Date  . Anginal pain ([place])   . Anxiety   . Arthritis    RA  . Asthma   . Brain tumor (benign) ([place])   . COPD (chronic obstructive pulmonary disease) ([place])   . Coronary artery disease   . DDD (degenerative disc disease)   . Depression   . Diabetes mellitus   . GERD (gastroesophageal reflux disease)   . Headache   . Heart murmur   . Heart murmur   . Hypercholesteremia   . Hypertension   . Lumbar degenerative disc disease   . Migraines   . Obese   . Obesity   . Osteopenia   . Osteoporosis   . Pneumonia   . PTSD (post-traumatic stress disorder)     Past Surgical History:  Procedure Laterality Date  . ABDOMINAL HYSTERECTOMY    . CERVICAL FUSION    . CHOLECYSTECTOMY     . COLONOSCOPY WITH PROPOFOL N/A 09/20/2015   Procedure: COLONOSCOPY WITH PROPOFOL;  Surgeon: Josefine Class, MD;  Location: [facility] ENDOSCOPY;  Service: Endoscopy;  Laterality: N/A;  . COLONOSCOPY WITH PROPOFOL N/A 01/27/2016   Procedure: COLONOSCOPY WITH PROPOFOL;  Surgeon: Manya Silvas, MD;  Location: [facility] ENDOSCOPY;  Service: Endoscopy;  Laterality: N/A;  . FINGER ARTHROPLASTY  02/23/2012   Procedure: FINGER ARTHROPLASTY;  Surgeon: Cammie Sickle., MD;  Location: [place];  Service: Orthopedics;  Laterality: Left;  Extensor carpi radialis longus to Extensor carpi ulnaris transfer, left Metaphalangeal reconstructions of index and long fingers,  . FOOT ARTHROPLASTY     toes x2 rt foot  . HAND RECONSTRUCTION  2011   right-multiple finger joint reconst  . JOINT REPLACEMENT     bilat knee replacements   Family History:  Family History  Problem Relation Age of Onset  . Depression Sister   . Hypertension Mother   . Arthritis/Rheumatoid Mother   . Heart attack Father   . Hypertension Father   . CAD    . Hypertension    . Diabetes Mellitus II    . Arthritis     Social History:  Social History   Social History  . Marital status: Divorced  Spouse name: N/A  . Number of children: N/A  . Years of education: N/A   Social History Main Topics  . Smoking status: Never Smoker  . Smokeless tobacco: Never Used  . Alcohol use No  . Drug use: No  . Sexual activity: No   Other Topics Concern  . None   Social History Narrative  . None   Additional History:  Patient currently lives with her family members   Assessment:   Musculoskeletal: Strength & Muscle Tone: within normal limits Gait & Station: normal Patient leans: N/A  Psychiatric Specialty Exam: Anxiety  Symptoms include dizziness and nervous/anxious behavior. Patient reports no nausea or palpitations.    Depression         Past medical history includes anxiety.   Medication Refill   Pertinent negatives include no chills, nausea or rash.    Review of Systems  Constitutional: Negative for chills.  HENT: Negative for hearing loss.   Eyes: Negative for photophobia.  Respiratory: Negative for sputum production.   Cardiovascular: Negative for palpitations.  Gastrointestinal: Negative for nausea.  Genitourinary: Negative for frequency.  Musculoskeletal: Positive for back pain.  Skin: Negative for rash.  Neurological: Positive for dizziness. Negative for tingling.  Endo/Heme/Allergies: Negative for environmental allergies.  Psychiatric/Behavioral: Positive for depression. Negative for hallucinations. The patient is nervous/anxious.   All other systems reviewed and are negative.   Blood pressure (!) 157/96, pulse (!) 111, temperature 98.8 F (37.1 C), temperature source Oral, height 4' 9.5" (1.461 m), weight 145 lb 12.8 oz (66.1 kg).Body mass index is 31 kg/m.  General Appearance: Casual  Eye Contact:  Fair  Speech:  Clear and Coherent  Volume:  Normal  Mood:  Anxious  Affect:  Congruent  Thought Process:  Goal Directed  Orientation:  Full (Time, Place, and Person)  Thought Content:  Hallucinations: Visual  Suicidal Thoughts:  No  Homicidal Thoughts:  No  Memory:  Immediate;   Fair  Judgement:  Fair  Insight:  Fair  Psychomotor Activity:  Normal  Concentration:  Fair  Recall:  [employer] of Knowledge: Fair  Language: Fair  Akathisia:  No  Handed:  Right  AIMS (if indicated):  none  Assets:  Communication Skills Desire for Improvement Physical Health  ADL's:  Intact  Cognition: WNL  Sleep:  6-7    Is the patient at risk to self?  No. Has the patient been a risk to self in the past 6 months?  No. Has the patient been a risk to self within the distant past?  No. Is the patient a risk to others?  No. Has the patient been a risk to others in the past 6 months?  No. Has the patient been a risk to others within the distant past?  No.  Current  Medications: Current Outpatient Prescriptions  Medication Sig Dispense Refill  . acyclovir ointment (ZOVIRAX) 5 % Apply topically every 3 (three) hours. Use as needed for blistering. 5 g 0  . albuterol (PROVENTIL HFA;VENTOLIN HFA) 108 (90 BASE) MCG/ACT inhaler Inhale 2 puffs into the lungs every 6 (six) hours as needed for wheezing or shortness of breath.     Marland Kitchen alendronate (FOSAMAX) 70 MG tablet Take 70 mg by mouth once a week. Pt takes on Monday.    . ALPRAZolam (XANAX) 0.25 MG tablet Take 1 tablet (0.25 mg total) by mouth at bedtime as needed for anxiety. 15 tablet 1  . budesonide-formoterol (SYMBICORT) 80-4.5 MCG/ACT inhaler Inhale 2 puffs into the lungs  2 (two) times daily.    . butalbital-acetaminophen-caffeine (FIORICET) 50-325-40 MG tablet Take 1-2 tablets by mouth every 6 (six) hours as needed for headache. 20 tablet 0  . Cholecalciferol (VITAMIN D) 2000 UNITS CAPS Take 1 capsule by mouth daily.    . ciclopirox (PENLAC) 8 % solution     . cloNIDine (CATAPRES) 0.1 MG tablet Take 0.1 mg by mouth 2 (two) times daily.    Marland Kitchen escitalopram (LEXAPRO) 10 MG tablet Take 1 tablet (10 mg total) by mouth every morning. 30 tablet 1  . esomeprazole (NEXIUM) 40 MG capsule Take 40 mg by mouth at bedtime.    Marland Kitchen HYDROcodone-acetaminophen (NORCO) 5-325 MG tablet Take 1 tablet by mouth every 4 (four) hours as needed for moderate pain. 20 tablet 0  . insulin glargine (LANTUS) 100 UNIT/ML injection Inject 0.15 mLs (15 Units total) into the skin at bedtime. 10 mL 11  . insulin starter kit- pen needles MISC 1 kit by Other route once. 1 kit 0  . isosorbide mononitrate (IMDUR) 30 MG 24 hr tablet Take 30 mg by mouth daily.    Marland Kitchen ketoconazole (NIZORAL) 2 % cream Apply 1 application topically at bedtime.    . lamoTRIgine (LAMICTAL) 100 MG tablet Take 1 tablet (100 mg total) by mouth daily. 90 tablet 3  . levocetirizine (XYZAL) 5 MG tablet Take 5 mg by mouth daily.     Marland Kitchen nystatin (MYCOSTATIN) 100000 UNIT/ML suspension      . ONE TOUCH ULTRA TEST test strip     . potassium chloride SA (K-DUR,KLOR-CON) 20 MEQ tablet     . predniSONE (DELTASONE) 5 MG tablet Take 5 mg by mouth at bedtime.     . pregabalin (LYRICA) 50 MG capsule Take 50 mg by mouth 2 (two) times daily.    . saxagliptin HCl (ONGLYZA) 5 MG TABS tablet Take 5 mg by mouth at bedtime.    . Syringe, Disposable, 1 ML MISC 300 Syringes by Does not apply route daily. 300 each 0  . Tofacitinib Citrate 5 MG TABS Take 1 tablet by mouth 2 (two) times daily.      No current facility-administered medications for this visit.     Medical Decision Making:  Established Problem, Stable/Improving (1), Review and summation of old records (2) and Review of Last Therapy Session (1)  Treatment Plan Summary:Medication management  Discussed with patient about the medications  She  will continue on Lexapro 10 mg in the morning and lamotrigine 100 mg at bedtime  She will continue on Xanax 0.25 mg at bedtime on a when necessary basis. She was given 15 day supply of the medications With 1 refill. Follow-up in 1 month or earlier depending on her symptoms    More than 50% of the time spent in psychoeducation, counseling and coordination of care.    This note was generated in part or whole with voice recognition software. Voice regonition is usually quite accurate but there are transcription errors that can and very often do occur. I apologize for any typographical errors that were not detected and corrected.   Rainey Pines, MD  03/16/2016, 11:10 AM

## 2016-03-18 ENCOUNTER — Emergency Department
Admission: EM | Admit: 2016-03-18 | Discharge: 2016-03-19 | Disposition: A | Discharge status: Home / Self Care | Payer: Medicare Other | Attending: Emergency Medicine | Admitting: Emergency Medicine

## 2016-03-18 DIAGNOSIS — J45909 Unspecified asthma, uncomplicated: Secondary | ICD-10-CM | POA: Insufficient documentation

## 2016-03-18 DIAGNOSIS — Z79899 Other long term (current) drug therapy: Secondary | ICD-10-CM | POA: Insufficient documentation

## 2016-03-18 DIAGNOSIS — R110 Nausea: Secondary | ICD-10-CM

## 2016-03-18 DIAGNOSIS — I10 Essential (primary) hypertension: Secondary | ICD-10-CM | POA: Diagnosis not present

## 2016-03-18 DIAGNOSIS — E876 Hypokalemia: Secondary | ICD-10-CM | POA: Insufficient documentation

## 2016-03-18 DIAGNOSIS — E119 Type 2 diabetes mellitus without complications: Secondary | ICD-10-CM | POA: Diagnosis not present

## 2016-03-18 DIAGNOSIS — J449 Chronic obstructive pulmonary disease, unspecified: Secondary | ICD-10-CM | POA: Diagnosis not present

## 2016-03-18 DIAGNOSIS — T443X5A Adverse effect of other parasympatholytics [anticholinergics and antimuscarinics] and spasmolytics, initial encounter: Secondary | ICD-10-CM | POA: Insufficient documentation

## 2016-03-18 DIAGNOSIS — Z794 Long term (current) use of insulin: Secondary | ICD-10-CM | POA: Diagnosis not present

## 2016-03-18 DIAGNOSIS — I2510 Atherosclerotic heart disease of native coronary artery without angina pectoris: Secondary | ICD-10-CM | POA: Diagnosis not present

## 2016-03-18 DIAGNOSIS — T887XXA Unspecified adverse effect of drug or medicament, initial encounter: Secondary | ICD-10-CM | POA: Diagnosis not present

## 2016-03-18 DIAGNOSIS — T50905A Adverse effect of unspecified drugs, medicaments and biological substances, initial encounter: Secondary | ICD-10-CM

## 2016-03-18 DIAGNOSIS — R197 Diarrhea, unspecified: Secondary | ICD-10-CM | POA: Diagnosis not present

## 2016-03-18 LAB — URINALYSIS COMPLETE WITH MICROSCOPIC (ARMC ONLY)
BILIRUBIN URINE: NEGATIVE
Bacteria, UA: NONE SEEN
GLUCOSE, UA: NEGATIVE mg/dL
KETONES UR: NEGATIVE mg/dL
NITRITE: NEGATIVE
PH: 5 (ref 5.0–8.0)
Protein, ur: NEGATIVE mg/dL
SPECIFIC GRAVITY, URINE: 1.018 (ref 1.005–1.030)

## 2016-03-18 LAB — CBC
HCT: 38.4 % (ref 35.0–47.0)
Hemoglobin: 13.4 g/dL (ref 12.0–16.0)
MCH: 28.6 pg (ref 26.0–34.0)
MCHC: 34.8 g/dL (ref 32.0–36.0)
MCV: 82.1 fL (ref 80.0–100.0)
PLATELETS: 247 10*3/uL (ref 150–440)
RBC: 4.67 MIL/uL (ref 3.80–5.20)
RDW: 16.2 % — AB (ref 11.5–14.5)
WBC: 7.3 10*3/uL (ref 3.6–11.0)

## 2016-03-18 LAB — COMPREHENSIVE METABOLIC PANEL
ALBUMIN: 3.4 g/dL — AB (ref 3.5–5.0)
ALK PHOS: 82 U/L (ref 38–126)
ALT: 33 U/L (ref 14–54)
ANION GAP: 8 (ref 5–15)
AST: 41 U/L (ref 15–41)
BILIRUBIN TOTAL: 0.8 mg/dL (ref 0.3–1.2)
BUN: 8 mg/dL (ref 6–20)
CALCIUM: 8.8 mg/dL — AB (ref 8.9–10.3)
CO2: 24 mmol/L (ref 22–32)
Chloride: 105 mmol/L (ref 101–111)
Creatinine, Ser: 0.5 mg/dL (ref 0.44–1.00)
GLUCOSE: 192 mg/dL — AB (ref 65–99)
Potassium: 2.7 mmol/L — CL (ref 3.5–5.1)
Sodium: 137 mmol/L (ref 135–145)
TOTAL PROTEIN: 6.7 g/dL (ref 6.5–8.1)

## 2016-03-18 LAB — LIPASE, BLOOD: Lipase: 26 U/L (ref 11–51)

## 2016-03-18 MED ORDER — SODIUM CHLORIDE 0.9 % IV BOLUS (SEPSIS)
1000.0000 mL | Freq: Once | INTRAVENOUS | Status: AC
  Administered 2016-03-18: 1000 mL via INTRAVENOUS

## 2016-03-18 MED ORDER — ACETAMINOPHEN 325 MG PO TABS
650.0000 mg | ORAL_TABLET | Freq: Once | ORAL | Status: AC
  Administered 2016-03-18: 650 mg via ORAL
  Filled 2016-03-18: qty 2

## 2016-03-18 MED ORDER — POTASSIUM CHLORIDE CRYS ER 20 MEQ PO TBCR
40.0000 meq | EXTENDED_RELEASE_TABLET | Freq: Once | ORAL | Status: AC
  Administered 2016-03-18: 40 meq via ORAL
  Filled 2016-03-18: qty 2

## 2016-03-18 MED ORDER — ONDANSETRON HCL 4 MG/2ML IJ SOLN
4.0000 mg | INTRAMUSCULAR | Status: AC
  Administered 2016-03-18: 4 mg via INTRAVENOUS
  Filled 2016-03-18: qty 2

## 2016-03-18 NOTE — ED Triage Notes (Signed)
Pt arrives to ER via POV c/o nausea, diarrhea and headache X 2 days, pt attributes sx to beginning new medication, ropinirole hcl on 03/12/16 for "restless leg". Denies CP or SOB. Pt alert and oriented X4, active, cooperative, pt in NAD. RR even and unlabored, color WNL.

## 2016-03-18 NOTE — ED Provider Notes (Signed)
[facility] Emergency Department Provider Note  ____________________________________________   First MD Initiated Contact with Patient 03/18/16 2258     (approximate)  I have reviewed the triage vital signs and the nursing notes.   HISTORY  Chief Complaint Nausea; Allergic Reaction; and Diarrhea    HPI Meghan [name] is a 70 y.o. female with an extensive chronic medical history who presents for evaluation of nausea, "dry heaves", and multiple episodes of loose stools over the last 3 days.  She states that these symptoms all started after starting ropinirole, prescribed by her primary care doctor for restless leg syndrome.  She has not been on this medication before.  She has had no sick contacts of which she is aware and did not eat any food that she thought might have made her ill.  The day after she began taking the medicine she started being nauseated and having the dry heaves and then started having loose stools, as many as 8 episodes per day.  She stopped taking the medication the next day and continued to have loose stools and dry heaves and slightly decreased food and drink intake but the symptoms seem to be improving.  However she was feeling some mild generalized weakness today and was frustrated so she came to the emergency department to get evaluated.  She did speak with her primary care doctor earlier today and scheduled her an appointment at 9:45 in the morning, but she wanted to get checked out tonight as well.  She has not vomited since yesterday and she had a normal movement with formed stool while in the emergency department.  He denies fever/chills, chest pain, shortness of breath, abdominal pain, dysuria.  As the symptoms as severe 2 days ago but now they are mild and improving with no specific intervention other than not taking the ropinirole.  Past Medical History:  Diagnosis Date  . Anginal pain ([place])   . Anxiety   . Arthritis    RA  . Asthma     . Brain tumor (benign) ([place])   . COPD (chronic obstructive pulmonary disease) ([place])   . Coronary artery disease   . DDD (degenerative disc disease)   . Depression   . Diabetes mellitus   . GERD (gastroesophageal reflux disease)   . Headache   . Heart murmur   . Heart murmur   . Hypercholesteremia   . Hypertension   . Lumbar degenerative disc disease   . Migraines   . Obese   . Obesity   . Osteopenia   . Osteoporosis   . Pneumonia   . PTSD (post-traumatic stress disorder)     Patient Active Problem List   Diagnosis Date Noted  . Seizure ([place]) 01/03/2016  . Chronic tension-type headache, intractable 12/04/2015  . 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 Degeneration of intervertebral disc of lumbar region 07/15/2015  . Seropositive rheumatoid arthritis ([place]) 07/15/2015  . Asthma with acute exacerbation 03/10/2015  . Hypokalemia 03/01/2015  . Hyponatremia 03/01/2015  . DDD (degenerative disc disease), lumbar 01/31/2015  . Arthritis, degenerative 01/31/2015  . Rheumatoid arthritis with rheumatoid factor ([place]) 01/31/2015  . Diabetes mellitus, type II ([place]) 12/24/2014  . HTN (hypertension) 12/24/2014  . Sepsis ([place]) 12/24/2014  . Left knee pain 12/24/2014  . GERD (gastroesophageal reflux disease) 12/24/2014  . COPD (chronic obstructive pulmonary disease) ([place]) 12/24/2014  . Depression 12/24/2014  . Anxiety 12/24/2014  . Severe bipolar disorder with psychotic features, mood-congruent ([place]) 11/16/2014  . Neurosis, posttraumatic 11/16/2014  .  H/O diabetes mellitus 11/16/2014  . H/O: HTN (hypertension) 11/16/2014  . H/O gastric ulcer 11/16/2014  . H/O: obesity 11/16/2014  . H/O arthritis 11/16/2014  . Barton's fracture of distal radius, closed 09/19/2014  . Neuritis or radiculitis due to rupture of lumbar intervertebral disc 05/11/2014  . Cervico-occipital neuralgia 03/26/2014  . Difficulty in walking 03/26/2014  . Difficulty in walking, not elsewhere classified 03/26/2014   . Cervical spine syndrome 01/26/2014  . Cephalalgia 01/09/2014  . Disordered sleep 01/09/2014  . Diabetes ([place]) 10/19/2013  . BP (high blood pressure) 10/19/2013  . Adiposity 10/19/2013  . Cardiac murmur 10/19/2013  . PNA (pneumonia) 10/19/2013  . Breath shortness 10/19/2013  . Chronic obstructive pulmonary disease ([place]) 10/19/2013  . Diabetes mellitus ([place]) 10/19/2013    Past Surgical History:  Procedure Laterality Date  . ABDOMINAL HYSTERECTOMY    . CERVICAL FUSION    . CHOLECYSTECTOMY    . COLONOSCOPY WITH PROPOFOL N/A 09/20/2015   Procedure: COLONOSCOPY WITH PROPOFOL;  Surgeon: Josefine Class, MD;  Location: [facility] ENDOSCOPY;  Service: Endoscopy;  Laterality: N/A;  . COLONOSCOPY WITH PROPOFOL N/A 01/27/2016   Procedure: COLONOSCOPY WITH PROPOFOL;  Surgeon: Manya Silvas, MD;  Location: [facility] ENDOSCOPY;  Service: Endoscopy;  Laterality: N/A;  . FINGER ARTHROPLASTY  02/23/2012   Procedure: FINGER ARTHROPLASTY;  Surgeon: Cammie Sickle., MD;  Location: [place];  Service: Orthopedics;  Laterality: Left;  Extensor carpi radialis longus to Extensor carpi ulnaris transfer, left Metaphalangeal reconstructions of index and long fingers,  . FOOT ARTHROPLASTY     toes x2 rt foot  . HAND RECONSTRUCTION  2011   right-multiple finger joint reconst  . JOINT REPLACEMENT     bilat knee replacements    Prior to Admission medications   Medication Sig Start Date End Date Taking? Authorizing Provider  acyclovir ointment (ZOVIRAX) 5 % Apply topically every 3 (three) hours. Use as needed for blistering. 03/09/15   Aldean Jew[place], MD  albuterol (PROVENTIL HFA;VENTOLIN HFA) 108 (90 BASE) MCG/ACT inhaler Inhale 2 puffs into the lungs every 6 (six) hours as needed for wheezing or shortness of breath.     Historical Provider, MD  alendronate (FOSAMAX) 70 MG tablet Take 70 mg by mouth once a week. Pt takes on Monday.    Historical Provider, MD  ALPRAZolam Duanne Moron) 0.25 MG  tablet Take 1 tablet (0.25 mg total) by mouth at bedtime as needed for anxiety. 03/16/16   Rainey Pines, MD  budesonide-formoterol (SYMBICORT) 80-4.5 MCG/ACT inhaler Inhale 2 puffs into the lungs 2 (two) times daily.    Historical Provider, MD  butalbital-acetaminophen-caffeine (FIORICET) [phone] MG tablet Take 1-2 tablets by mouth every 6 (six) hours as needed for headache. 09/08/15 09/07/16  Lavonia Drafts, MD  Cholecalciferol (VITAMIN D) 2000 UNITS CAPS Take 1 capsule by mouth daily.    Historical Provider, MD  ciclopirox (PENLAC) 8 % solution  12/27/15   Historical Provider, MD  cloNIDine (CATAPRES) 0.1 MG tablet Take 0.1 mg by mouth 2 (two) times daily.    Historical Provider, MD  escitalopram (LEXAPRO) 10 MG tablet Take 1 tablet (10 mg total) by mouth every morning. 03/16/16   Rainey Pines, MD  esomeprazole (NEXIUM) 40 MG capsule Take 40 mg by mouth at bedtime.    Historical Provider, MD  HYDROcodone-acetaminophen (NORCO) 5-325 MG tablet Take 1 tablet by mouth every 4 (four) hours as needed for moderate pain. 02/29/16   Ruby Cola, MD  insulin glargine (LANTUS) 100 UNIT/ML injection  Inject 0.15 mLs (15 Units total) into the skin at bedtime. 03/10/15   Aldean Jewett, MD  insulin starter kit- pen needles MISC 1 kit by Other route once. 03/09/15   Aldean Jewett, MD  isosorbide mononitrate (IMDUR) 30 MG 24 hr tablet Take 30 mg by mouth daily.    Historical Provider, MD  ketoconazole (NIZORAL) 2 % cream Apply 1 application topically at bedtime.    Historical Provider, MD  lamoTRIgine (LAMICTAL) 100 MG tablet Take 1 tablet (100 mg total) by mouth daily. 02/25/16   Rainey Pines, MD  levocetirizine (XYZAL) 5 MG tablet Take 5 mg by mouth daily.     Historical Provider, MD  nystatin (MYCOSTATIN) 100000 UNIT/ML suspension  02/19/15   Historical Provider, MD  ondansetron (ZOFRAN ODT) 4 MG disintegrating tablet Allow 1-2 tablets to dissolve in your mouth every 8 hours as needed for nausea/vomiting 03/19/16    Hinda Kehr, MD  ONE TOUCH ULTRA TEST test strip  02/19/15   Historical Provider, MD  potassium chloride SA (K-DUR,KLOR-CON) 20 MEQ tablet  03/18/15   Historical Provider, MD  predniSONE (DELTASONE) 5 MG tablet Take 5 mg by mouth at bedtime.     Historical Provider, MD  pregabalin (LYRICA) 50 MG capsule Take 50 mg by mouth 2 (two) times daily.    Historical Provider, MD  saxagliptin HCl (ONGLYZA) 5 MG TABS tablet Take 5 mg by mouth at bedtime.    Historical Provider, MD  Syringe, Disposable, 1 ML MISC 300 Syringes by Does not apply route daily. 03/11/15   Aldean Jewett, MD  Tofacitinib Citrate 5 MG TABS Take 1 tablet by mouth 2 (two) times daily.     Historical Provider, MD    Allergies Gabapentin; Iodine; Naproxen; Nsaids; and Tramadol  Family History  Problem Relation Age of Onset  . Depression Sister   . Hypertension Mother   . Arthritis/Rheumatoid Mother   . Heart attack Father   . Hypertension Father   . CAD    . Hypertension    . Diabetes Mellitus II    . Arthritis      Social History Social History  Substance Use Topics  . Smoking status: Never Smoker  . Smokeless tobacco: Never Used  . Alcohol use No    Review of Systems Constitutional: No fever/chills Eyes: No visual changes. ENT: No sore throat. Cardiovascular: Denies chest pain. Respiratory: Denies shortness of breath. Gastrointestinal: No abdominal pain.  Nausea, "dry heaves", multiple loose stools, now improving. Genitourinary: Negative for dysuria. Musculoskeletal: Negative for back pain. Skin: Negative for rash. Neurological: Negative for headaches, focal weakness or numbness.  Generalized weakness  10-point ROS otherwise negative.  ____________________________________________   PHYSICAL EXAM:  VITAL SIGNS: ED Triage Vitals  Enc Vitals Group     BP 03/18/16 1828 (!) 174/91     Pulse Rate 03/18/16 1828 (!) 115     Resp 03/18/16 2201 14     Temp 03/18/16 1828 99.4 F (37.4 C)     Temp Source  03/18/16 1828 Oral     SpO2 03/18/16 1828 96 %     Weight 03/18/16 1828 147 lb (66.7 kg)     Height 03/18/16 1828 _0  (1.448 m)     Head Circumference --      Peak Flow --      Pain Score 03/18/16 1836 9     Pain Loc --      Pain Edu? --      Excl. in [place]? --  Constitutional: Alert and oriented. Well appearing and in no acute distress. Eyes: Conjunctivae are normal. PERRL. EOMI. Head: Atraumatic. Nose: No congestion/rhinnorhea. Mouth/Throat: Mucous membranes are moist.  Oropharynx non-erythematous. Neck: No stridor.  No meningeal signs.   Cardiovascular: Normal rate, regular rhythm. Good peripheral circulation. Grossly normal heart sounds.   Respiratory: Normal respiratory effort.  No retractions. Lungs CTAB. Gastrointestinal: Soft and nontender. No distention.  Musculoskeletal: No lower extremity tenderness nor edema. No gross deformities of extremities. Neurologic:  Normal speech and language. No gross focal neurologic deficits are appreciated.  Skin:  Skin is warm, dry and intact. No rash noted. Psychiatric: Mood and affect are normal. Speech and behavior are normal.  ____________________________________________   LABS (all labs ordered are listed, but only abnormal results are displayed)  Labs Reviewed  COMPREHENSIVE METABOLIC PANEL - Abnormal; Notable for the following:       Result Value   Potassium 2.7 (*)    Glucose, Bld 192 (*)    Calcium 8.8 (*)    Albumin 3.4 (*)    All other components within normal limits  CBC - Abnormal; Notable for the following:    RDW 16.2 (*)    All other components within normal limits  URINALYSIS COMPLETEWITH MICROSCOPIC ([facility] ONLY) - Abnormal; Notable for the following:    Color, Urine YELLOW (*)    APPearance HAZY (*)    Hgb urine dipstick 1+ (*)    Leukocytes, UA TRACE (*)    Squamous Epithelial / LPF 0-5 (*)    All other components within normal limits  LIPASE, BLOOD    ____________________________________________  EKG  None ____________________________________________  RADIOLOGY   No results found.  ____________________________________________   PROCEDURES  Procedure(s) performed:   Procedures   Critical Care performed: No ____________________________________________   INITIAL IMPRESSION / ASSESSMENT AND PLAN / ED COURSE  Pertinent labs & imaging results that were available during my care of the patient were reviewed by me and considered in my medical decision making (see chart for details).  The patient is well-appearing and in no acute distress with normal vital signs.  She has had a normal formed stool while in the ED.  She has not had any vomiting and she is tolerating by mouth intake.  Her labs are notable for hypokalemia which she states she has at baseline takes potassium supplements but is not unexpected given the increased GI losses.  We repleted her with 40 mEq by mouth.  I gave her some Zofran for some persistent nausea but overall she states that she feels well after 1 L of fluids.  I encouraged her to go ahead and follow up with her primary care doctor in the morning as scheduled and she states that she will do so.  She will not take any additional ropinirole in the meantime.   ____________________________________________  FINAL CLINICAL IMPRESSION(S) / ED DIAGNOSES  Final diagnoses:  Diarrhea, unspecified type  Nausea  Drug reaction, initial encounter  Hypokalemia, gastrointestinal losses     MEDICATIONS GIVEN DURING THIS VISIT:  Medications  sodium chloride 0.9 % bolus 1,000 mL (1,000 mLs Intravenous New Bag/Given 03/18/16 2229)  potassium chloride SA (K-DUR,KLOR-CON) CR tablet 40 mEq (40 mEq Oral Given 03/18/16 2224)  acetaminophen (TYLENOL) tablet 650 mg (650 mg Oral Given 03/18/16 2328)  ondansetron (ZOFRAN) injection 4 mg (4 mg Intravenous Given 03/18/16 2329)     NEW OUTPATIENT MEDICATIONS STARTED DURING THIS  VISIT:  New Prescriptions   ONDANSETRON (ZOFRAN ODT) 4 MG DISINTEGRATING TABLET  Allow 1-2 tablets to dissolve in your mouth every 8 hours as needed for nausea/vomiting      Note:  This document was prepared using Dragon voice recognition software and may include unintentional dictation errors.    Hinda Kehr, MD 03/19/16 [phone]

## 2016-03-19 DIAGNOSIS — M069 Rheumatoid arthritis, unspecified: Secondary | ICD-10-CM | POA: Diagnosis not present

## 2016-03-19 DIAGNOSIS — R110 Nausea: Secondary | ICD-10-CM | POA: Diagnosis not present

## 2016-03-19 DIAGNOSIS — G2581 Restless legs syndrome: Secondary | ICD-10-CM | POA: Diagnosis not present

## 2016-03-19 DIAGNOSIS — I10 Essential (primary) hypertension: Secondary | ICD-10-CM | POA: Diagnosis not present

## 2016-03-19 DIAGNOSIS — E876 Hypokalemia: Secondary | ICD-10-CM | POA: Diagnosis not present

## 2016-03-19 MED ORDER — ONDANSETRON 4 MG PO TBDP: ORAL_TABLET | ORAL | 0 refills | Status: DC

## 2016-03-19 NOTE — Discharge Instructions (Signed)
As we discussed, your workup today was reassuring.  Though we do not know exactly what is causing your symptoms, it may have been the new medication you were taking.  It appears that you have no emergent medical condition at this time are safe to go home and follow up as recommended in this paperwork.  Continue taking your regular medications (including your potassium supplements) except for the ropinirole.  Please return immediately to the Emergency Department if you develop any new or worsening symptoms that concern you.

## 2016-03-23 DIAGNOSIS — Z0001 Encounter for general adult medical examination with abnormal findings: Secondary | ICD-10-CM | POA: Diagnosis not present

## 2016-03-23 DIAGNOSIS — G2581 Restless legs syndrome: Secondary | ICD-10-CM | POA: Diagnosis not present

## 2016-03-23 DIAGNOSIS — R110 Nausea: Secondary | ICD-10-CM | POA: Diagnosis not present

## 2016-03-23 DIAGNOSIS — E1165 Type 2 diabetes mellitus with hyperglycemia: Secondary | ICD-10-CM | POA: Diagnosis not present

## 2016-03-23 DIAGNOSIS — J449 Chronic obstructive pulmonary disease, unspecified: Secondary | ICD-10-CM | POA: Diagnosis not present

## 2016-03-23 DIAGNOSIS — I10 Essential (primary) hypertension: Secondary | ICD-10-CM | POA: Diagnosis not present

## 2016-03-24 DIAGNOSIS — M5126 Other intervertebral disc displacement, lumbar region: Secondary | ICD-10-CM | POA: Diagnosis not present

## 2016-03-24 DIAGNOSIS — M5136 Other intervertebral disc degeneration, lumbar region: Secondary | ICD-10-CM | POA: Diagnosis not present

## 2016-03-24 DIAGNOSIS — M4726 Other spondylosis with radiculopathy, lumbar region: Secondary | ICD-10-CM | POA: Diagnosis not present

## 2016-03-24 DIAGNOSIS — M4316 Spondylolisthesis, lumbar region: Secondary | ICD-10-CM | POA: Diagnosis not present

## 2016-03-24 DIAGNOSIS — M4806 Spinal stenosis, lumbar region: Secondary | ICD-10-CM | POA: Diagnosis not present

## 2016-04-06 DIAGNOSIS — R569 Unspecified convulsions: Secondary | ICD-10-CM | POA: Diagnosis not present

## 2016-04-06 DIAGNOSIS — G44229 Chronic tension-type headache, not intractable: Secondary | ICD-10-CM | POA: Diagnosis not present

## 2016-04-06 DIAGNOSIS — G479 Sleep disorder, unspecified: Secondary | ICD-10-CM | POA: Diagnosis not present

## 2016-04-06 DIAGNOSIS — R262 Difficulty in walking, not elsewhere classified: Secondary | ICD-10-CM | POA: Diagnosis not present

## 2016-04-07 DIAGNOSIS — M5136 Other intervertebral disc degeneration, lumbar region: Secondary | ICD-10-CM | POA: Diagnosis not present

## 2016-04-07 DIAGNOSIS — M4316 Spondylolisthesis, lumbar region: Secondary | ICD-10-CM | POA: Diagnosis not present

## 2016-04-07 DIAGNOSIS — G8929 Other chronic pain: Secondary | ICD-10-CM | POA: Diagnosis not present

## 2016-04-07 DIAGNOSIS — M545 Low back pain: Secondary | ICD-10-CM | POA: Diagnosis not present

## 2016-04-08 ENCOUNTER — Other Ambulatory Visit: Payer: Self-pay | Admitting: Neurological Surgery

## 2016-04-08 DIAGNOSIS — M4316 Spondylolisthesis, lumbar region: Secondary | ICD-10-CM

## 2016-04-08 DIAGNOSIS — M545 Low back pain, unspecified: Secondary | ICD-10-CM

## 2016-04-08 DIAGNOSIS — G8929 Other chronic pain: Secondary | ICD-10-CM

## 2016-04-14 ENCOUNTER — Other Ambulatory Visit: Payer: Self-pay | Admitting: Neurology

## 2016-04-14 DIAGNOSIS — M545 Low back pain: Secondary | ICD-10-CM

## 2016-04-14 DIAGNOSIS — G8929 Other chronic pain: Secondary | ICD-10-CM

## 2016-04-14 DIAGNOSIS — M4316 Spondylolisthesis, lumbar region: Secondary | ICD-10-CM

## 2016-04-15 DIAGNOSIS — G8929 Other chronic pain: Secondary | ICD-10-CM | POA: Diagnosis not present

## 2016-04-15 DIAGNOSIS — M810 Age-related osteoporosis without current pathological fracture: Secondary | ICD-10-CM | POA: Diagnosis not present

## 2016-04-15 DIAGNOSIS — M545 Low back pain: Secondary | ICD-10-CM | POA: Diagnosis not present

## 2016-04-20 ENCOUNTER — Ambulatory Visit: Payer: 59 | Admitting: Psychiatry

## 2016-04-21 ENCOUNTER — Ambulatory Visit: Payer: Medicare Other

## 2016-04-23 DIAGNOSIS — R110 Nausea: Secondary | ICD-10-CM | POA: Diagnosis not present

## 2016-04-23 DIAGNOSIS — J0191 Acute recurrent sinusitis, unspecified: Secondary | ICD-10-CM | POA: Diagnosis not present

## 2016-04-23 DIAGNOSIS — E1165 Type 2 diabetes mellitus with hyperglycemia: Secondary | ICD-10-CM | POA: Diagnosis not present

## 2016-04-23 DIAGNOSIS — E876 Hypokalemia: Secondary | ICD-10-CM | POA: Diagnosis not present

## 2016-04-23 DIAGNOSIS — R1084 Generalized abdominal pain: Secondary | ICD-10-CM | POA: Diagnosis not present

## 2016-05-04 ENCOUNTER — Ambulatory Visit: Admission: RE | Admit: 2016-05-04 | Payer: Medicare Other | Source: Ambulatory Visit

## 2016-05-05 ENCOUNTER — Ambulatory Visit: Payer: 59 | Admitting: Psychiatry

## 2016-05-05 ENCOUNTER — Ambulatory Visit (INDEPENDENT_AMBULATORY_CARE_PROVIDER_SITE_OTHER): Payer: 59 | Admitting: Psychiatry

## 2016-05-05 ENCOUNTER — Encounter: Payer: Self-pay | Admitting: Psychiatry

## 2016-05-05 ENCOUNTER — Other Ambulatory Visit: Payer: Self-pay | Admitting: Psychiatry

## 2016-05-05 VITALS — BP 154/97 | HR 115 | Temp 97.8°F | Ht <= 58 in | Wt 136.4 lb

## 2016-05-05 DIAGNOSIS — F411 Generalized anxiety disorder: Secondary | ICD-10-CM | POA: Diagnosis not present

## 2016-05-05 DIAGNOSIS — F3160 Bipolar disorder, current episode mixed, unspecified: Secondary | ICD-10-CM

## 2016-05-05 LAB — COMPREHENSIVE METABOLIC PANEL
ALT: 13 IU/L (ref 0–32)
AST: 17 IU/L (ref 0–40)
Albumin/Globulin Ratio: 1.2 (ref 1.2–2.2)
Albumin: 3.7 g/dL (ref 3.5–4.8)
Alkaline Phosphatase: 113 IU/L (ref 39–117)
BUN/Creatinine Ratio: 9 — ABNORMAL LOW (ref 12–28)
BUN: 7 mg/dL — ABNORMAL LOW (ref 8–27)
Bilirubin Total: 1 mg/dL (ref 0.0–1.2)
CO2: 19 mmol/L (ref 18–29)
Calcium: 9.4 mg/dL (ref 8.7–10.3)
Chloride: 97 mmol/L (ref 96–106)
Creatinine, Ser: 0.75 mg/dL (ref 0.57–1.00)
GFR calc Af Amer: 93 mL/min/{1.73_m2} (ref >59)
GFR calc non Af Amer: 81 mL/min/{1.73_m2} (ref >59)
Globulin, Total: 3.1 g/dL (ref 1.5–4.5)
Glucose: 228 mg/dL — ABNORMAL HIGH (ref 65–99)
Potassium: 4.1 mmol/L (ref 3.5–5.2)
Sodium: 135 mmol/L (ref 134–144)
Total Protein: 6.8 g/dL (ref 6.0–8.5)

## 2016-05-05 LAB — MICROSCOPIC EXAMINATION: Bacteria, UA: NONE SEEN

## 2016-05-05 LAB — CBC
Hematocrit: 42.8 % (ref 34.0–46.6)
Hemoglobin: 14.7 g/dL (ref 11.1–15.9)
MCH: 29.1 pg (ref 26.6–33.0)
MCHC: 34.3 g/dL (ref 31.5–35.7)
MCV: 85 fL (ref 79–97)
Platelets: 321 10*3/uL (ref 150–379)
RBC: 5.05 x10E6/uL (ref 3.77–5.28)
RDW: 15.1 % (ref 12.3–15.4)
WBC: 8.6 10*3/uL (ref 3.4–10.8)

## 2016-05-05 LAB — URINALYSIS, ROUTINE W REFLEX MICROSCOPIC
Bilirubin, UA: NEGATIVE
Glucose, UA: NEGATIVE
Nitrite, UA: NEGATIVE
RBC, UA: NEGATIVE
Specific Gravity, UA: 1.027 (ref 1.005–1.030)
Urobilinogen, Ur: 1 mg/dL (ref 0.2–1.0)
pH, UA: 6 (ref 5.0–7.5)

## 2016-05-05 NOTE — Progress Notes (Signed)
BH MD/PA/NP OP Progress Note  05/05/2016 9:52 AM Meghan [name]  MRN:  [id number]  Subjective:   Meghan [name]  is a 70year-old female who presented for the follow-up appointment. She reported that she feeling very nauseated and is not feeling well. She went to her primary care physician who gave her a prescription of Compazine. Patient reported that she has been having dry heaves and is not feeling stable. She wants medications to help her at this time. She stated that she has not been eating well. Patient reported that she has not changed any of her medications at this time. She was started on Topamax by Dr. Melrose Nakayama but she has not started taking the medication. We discussed about the list of her medications and she acknowledged that she has been on the same medications for long period of time. She currently denied having any suicidal ideations or plans. She agreed to have the stat  lab done at this time due to her history of diabetes.   She denied having any suicidal ideations or plans  Chief Complaint:  Chief Complaint    Follow-up; Medication Refill     Visit Diagnosis:     ICD-9-CM ICD-10-CM   1. Bipolar I disorder, most recent episode mixed ([place]) 296.60 F31.60   2. Anxiety state 300.00 F41.1     Past Medical History:  Past Medical History:  Diagnosis Date  . Anginal pain ([place])   . Anxiety   . Arthritis    RA  . Asthma   . Brain tumor (benign) ([place])   . COPD (chronic obstructive pulmonary disease) ([place])   . Coronary artery disease   . DDD (degenerative disc disease)   . Depression   . Diabetes mellitus   . GERD (gastroesophageal reflux disease)   . Headache   . Heart murmur   . Heart murmur   . Hypercholesteremia   . Hypertension   . Lumbar degenerative disc disease   . Migraines   . Obese   . Obesity   . Osteopenia   . Osteoporosis   . Pneumonia   . PTSD (post-traumatic stress disorder)     Past Surgical History:  Procedure Laterality Date  . ABDOMINAL HYSTERECTOMY     . CERVICAL FUSION    . CHOLECYSTECTOMY    . COLONOSCOPY WITH PROPOFOL N/A 09/20/2015   Procedure: COLONOSCOPY WITH PROPOFOL;  Surgeon: Josefine Class, MD;  Location[facility] ENDOSCOPY;  Service: Endoscopy;  Laterality: N/A;  . COLONOSCOPY WITH PROPOFOL N/A 01/27/2016   Procedure: COLONOSCOPY WITH PROPOFOL;  Surgeon: Manya Silvas, MD;  Location: [facility] ENDOSCOPY;  Service: Endoscopy;  Laterality: N/A;  . FINGER ARTHROPLASTY  02/23/2012   Procedure: FINGER ARTHROPLASTY;  Surgeon: Cammie Sickle., MD;  Location: [place];  Service: Orthopedics;  Laterality: Left;  Extensor carpi radialis longus to Extensor carpi ulnaris transfer, left Metaphalangeal reconstructions of index and long fingers,  . FOOT ARTHROPLASTY     toes x2 rt foot  . HAND RECONSTRUCTION  2011   right-multiple finger joint reconst  . JOINT REPLACEMENT     bilat knee replacements   Family History:  Family History  Problem Relation Age of Onset  . Depression Sister   . Hypertension Mother   . Arthritis/Rheumatoid Mother   . Heart attack Father   . Hypertension Father   . CAD    . Hypertension    . Diabetes Mellitus II    . Arthritis     Social History:  Social History   Social History  . Marital status: Divorced    Spouse name: N/A  . Number of children: N/A  . Years of education: N/A   Social History Main Topics  . Smoking status: Never Smoker  . Smokeless tobacco: Never Used  . Alcohol use No  . Drug use: No  . Sexual activity: No   Other Topics Concern  . None   Social History Narrative  . None   Additional History:  Patient currently lives with her family members   Assessment:   Musculoskeletal: Strength & Muscle Tone: within normal limits Gait & Station: normal Patient leans: N/A  Psychiatric Specialty Exam: Medication Refill  Pertinent negatives include no chills, nausea or rash.  Anxiety  Symptoms include dizziness and nervous/anxious behavior. Patient reports  no nausea or palpitations.    Depression         Past medical history includes anxiety.     Review of Systems  Constitutional: Negative for chills.  HENT: Negative for hearing loss.   Eyes: Negative for photophobia.  Respiratory: Negative for sputum production.   Cardiovascular: Negative for palpitations.  Gastrointestinal: Negative for nausea.  Genitourinary: Negative for frequency.  Musculoskeletal: Positive for back pain.  Skin: Negative for rash.  Neurological: Positive for dizziness. Negative for tingling.  Endo/Heme/Allergies: Negative for environmental allergies.  Psychiatric/Behavioral: Positive for depression. Negative for hallucinations. The patient is nervous/anxious.   All other systems reviewed and are negative.   Blood pressure (!) 154/97, pulse (!) 115, temperature 97.8 F (36.6 C), temperature source Oral, height _0  (1.448 m), weight 136 lb 6.4 oz (61.9 kg).Body mass index is 29.52 kg/m.  General Appearance: Casual  Eye Contact:  Fair  Speech:  Clear and Coherent  Volume:  Normal  Mood:  Anxious  Affect:  Congruent  Thought Process:  Goal Directed  Orientation:  Full (Time, Place, and Person)  Thought Content:  Negative and NA  Suicidal Thoughts:  No  Homicidal Thoughts:  No  Memory:  Immediate;   Fair  Judgement:  Fair  Insight:  Fair  Psychomotor Activity:  Normal  Concentration:  Fair  Recall:  [employer] of Knowledge: Fair  Language: Fair  Akathisia:  No  Handed:  Right  AIMS (if indicated):  none  Assets:  Communication Skills Desire for Improvement Physical Health  ADL's:  Intact  Cognition: WNL  Sleep:  6-7    Is the patient at risk to self?  No. Has the patient been a risk to self in the past 6 months?  No. Has the patient been a risk to self within the distant past?  No. Is the patient a risk to others?  No. Has the patient been a risk to others in the past 6 months?  No. Has the patient been a risk to others within the distant  past?  No.  Current Medications: Current Outpatient Prescriptions  Medication Sig Dispense Refill  . acyclovir ointment (ZOVIRAX) 5 % Apply topically every 3 (three) hours. Use as needed for blistering. 5 g 0  . albuterol (PROVENTIL HFA;VENTOLIN HFA) 108 (90 BASE) MCG/ACT inhaler Inhale 2 puffs into the lungs every 6 (six) hours as needed for wheezing or shortness of breath.     Marland Kitchen alendronate (FOSAMAX) 70 MG tablet Take 70 mg by mouth once a week. Pt takes on Monday.    . ALPRAZolam (XANAX) 0.25 MG tablet Take 1 tablet (0.25 mg total) by mouth at bedtime as needed for anxiety. 15  tablet 1  . budesonide-formoterol (SYMBICORT) 80-4.5 MCG/ACT inhaler Inhale 2 puffs into the lungs 2 (two) times daily.    . butalbital-acetaminophen-caffeine (FIORICET) 50-325-40 MG tablet Take 1-2 tablets by mouth every 6 (six) hours as needed for headache. 20 tablet 0  . Cholecalciferol (VITAMIN D) 2000 UNITS CAPS Take 1 capsule by mouth daily.    . ciclopirox (PENLAC) 8 % solution     . cloNIDine (CATAPRES) 0.1 MG tablet Take 0.1 mg by mouth 2 (two) times daily.    Marland Kitchen escitalopram (LEXAPRO) 10 MG tablet Take 1 tablet (10 mg total) by mouth every morning. 30 tablet 1  . esomeprazole (NEXIUM) 40 MG capsule Take 40 mg by mouth at bedtime.    . insulin starter kit- pen needles MISC 1 kit by Other route once. 1 kit 0  . isosorbide mononitrate (IMDUR) 30 MG 24 hr tablet Take 30 mg by mouth daily.    Marland Kitchen ketoconazole (NIZORAL) 2 % cream Apply 1 application topically at bedtime.    . lamoTRIgine (LAMICTAL) 100 MG tablet Take 1 tablet (100 mg total) by mouth daily. 90 tablet 3  . levocetirizine (XYZAL) 5 MG tablet Take 5 mg by mouth daily.     . meloxicam (MOBIC) 7.5 MG tablet     . nystatin (MYCOSTATIN) 100000 UNIT/ML suspension     . ONE TOUCH ULTRA TEST test strip     . potassium chloride SA (K-DUR,KLOR-CON) 20 MEQ tablet     . predniSONE (DELTASONE) 5 MG tablet Take 5 mg by mouth at bedtime.     . pregabalin (LYRICA)  50 MG capsule Take 50 mg by mouth 2 (two) times daily.    . pregabalin (LYRICA) 50 MG capsule TAKE ONE CAPSULE BY MOUTH TWICE A DAY    . prochlorperazine (COMPAZINE) 5 MG tablet Take 5 mg by mouth every 6 (six) hours as needed for nausea or vomiting.    . saxagliptin HCl (ONGLYZA) 5 MG TABS tablet Take 5 mg by mouth at bedtime.    . Syringe, Disposable, 1 ML MISC 300 Syringes by Does not apply route daily. 300 each 0  . Tofacitinib Citrate 5 MG TABS Take 1 tablet by mouth 2 (two) times daily.      No current facility-administered medications for this visit.     Medical Decision Making:  Established Problem, Stable/Improving (1), Review and summation of old records (2) and Review of Last Therapy Session (1)  Treatment Plan Summary:Medication management  Patient was given a lab requisition form to have her stat labs including CBC with differential and CMP.  Discussed with patient about the medications  She  will continue on Lexapro 10 mg in the morning and lamotrigine 100 mg at bedtime  Follow-up in 2 weeks  or earlier depending on her symptoms. She reported that she will come back for the appointment if she is feeling well.     More than 50% of the time spent in psychoeducation, counseling and coordination of care.    This note was generated in part or whole with voice recognition software. Voice regonition is usually quite accurate but there are transcription errors that can and very often do occur. I apologize for any typographical errors that were not detected and corrected.   Rainey Pines, MD  05/05/2016, 9:52 AM

## 2016-05-06 ENCOUNTER — Telehealth: Payer: Self-pay

## 2016-05-06 NOTE — Telephone Encounter (Signed)
Called pt about her labs and advised her to follow up with PCP due to elevated Blood sugar and questionable UTI. She reported that she is feeling very tired and sick and might not want to go today. She was asking me if she can send somebody to pick up her labs. I advised patient that she should come by herself so she can follow up with her primary care physician as well. However after much hesitation she agreed with the plan. Advised patient that I would leave a copy of the lab results at the front.  She demonstrated understanding.

## 2016-05-06 NOTE — Telephone Encounter (Signed)
pt labwork has come in . please review and sign off on .

## 2016-05-07 DIAGNOSIS — E119 Type 2 diabetes mellitus without complications: Secondary | ICD-10-CM | POA: Diagnosis not present

## 2016-05-07 DIAGNOSIS — R1084 Generalized abdominal pain: Secondary | ICD-10-CM | POA: Diagnosis not present

## 2016-05-07 DIAGNOSIS — D519 Vitamin B12 deficiency anemia, unspecified: Secondary | ICD-10-CM | POA: Diagnosis not present

## 2016-05-07 DIAGNOSIS — R110 Nausea: Secondary | ICD-10-CM | POA: Diagnosis not present

## 2016-05-07 DIAGNOSIS — N390 Urinary tract infection, site not specified: Secondary | ICD-10-CM | POA: Diagnosis not present

## 2016-05-11 IMAGING — MR MRI LUMBAR SPINE WITHOUT CONTRAST
4 of 5 series · 12 of 48 positions shown · non-contrast
Comparison: Lumbar radiographs dated 04/14/2013 and MRI dated
02/24/2013

CLINICAL DATA: Low back pain and bilateral leg pain.

EXAM:
MRI LUMBAR SPINE WITHOUT CONTRAST
TECHNIQUE: Multiplanar, multisequence MR imaging of the lumbar spine was
performed. No intravenous contrast was administered.

[Series 2: T2 · sagittal · 4.0mm · 0.36mm/px · 3 of 15 slices shown (1 of 2)]
[im 3/15]
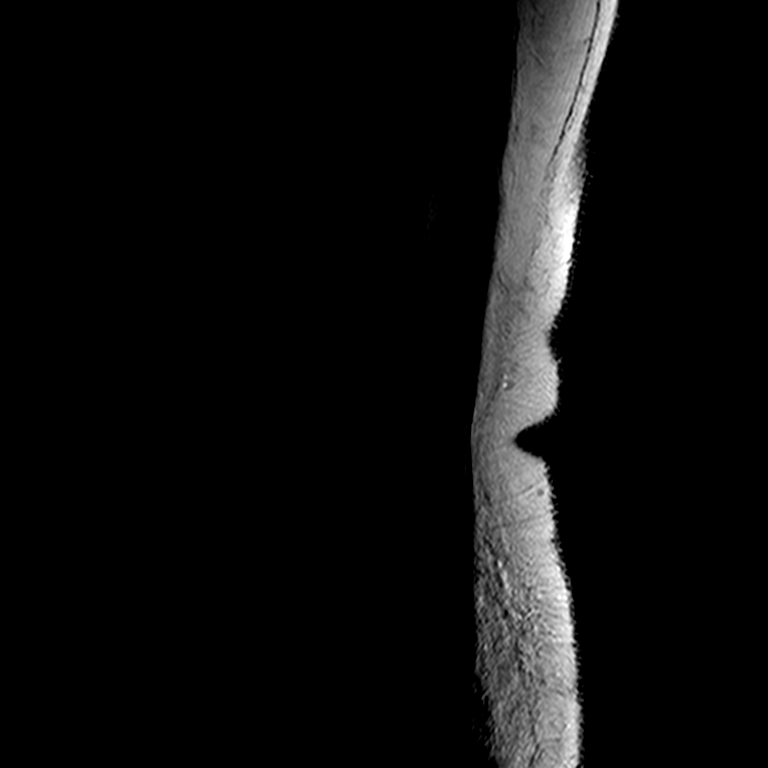
[im 8/15]
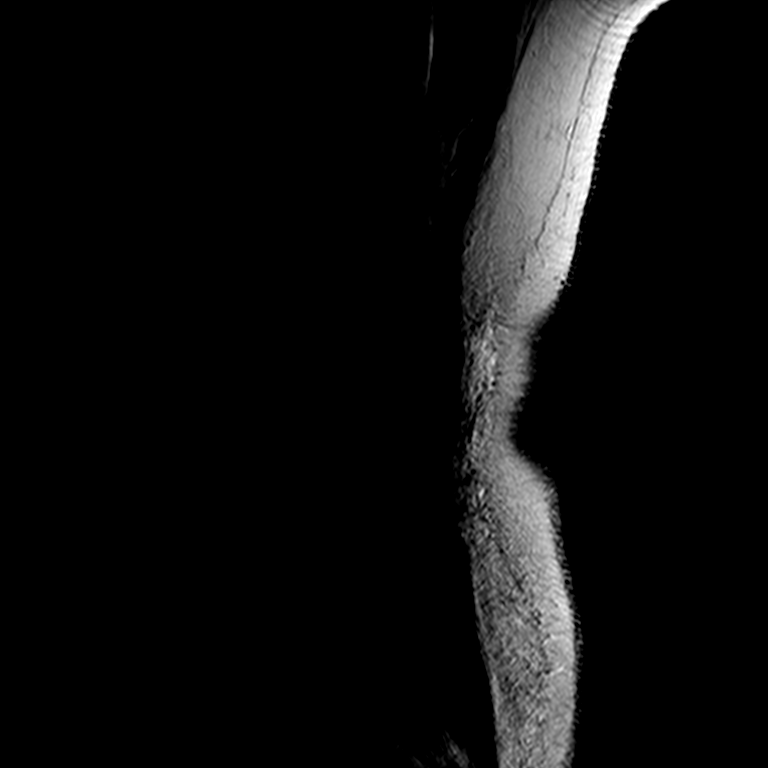
[im 12/15]
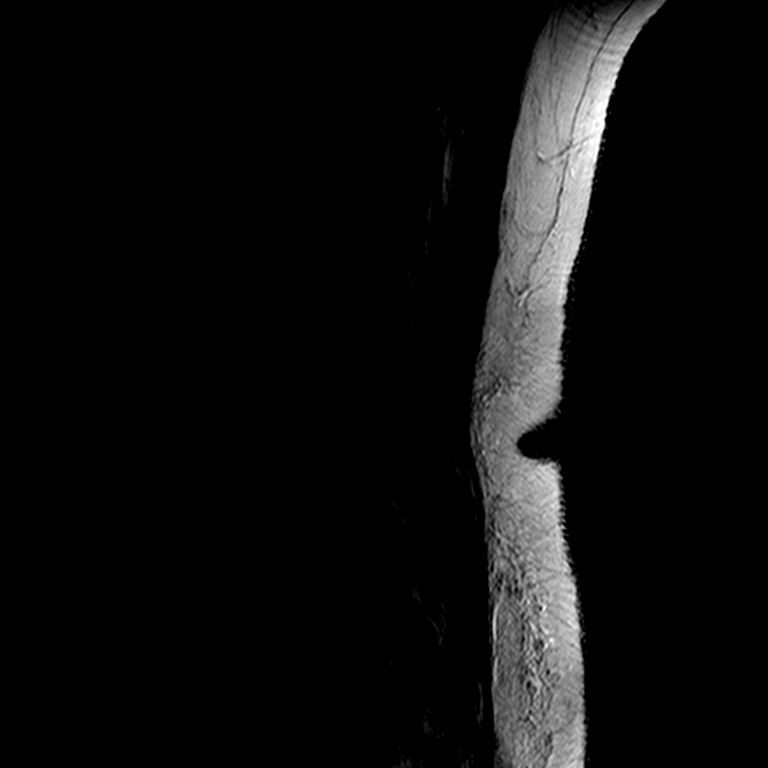

[Series 3: T1 · sagittal · 4.0mm · 0.44mm/px · 3 of 15 slices shown (1 of 2)]
[im 3/15]
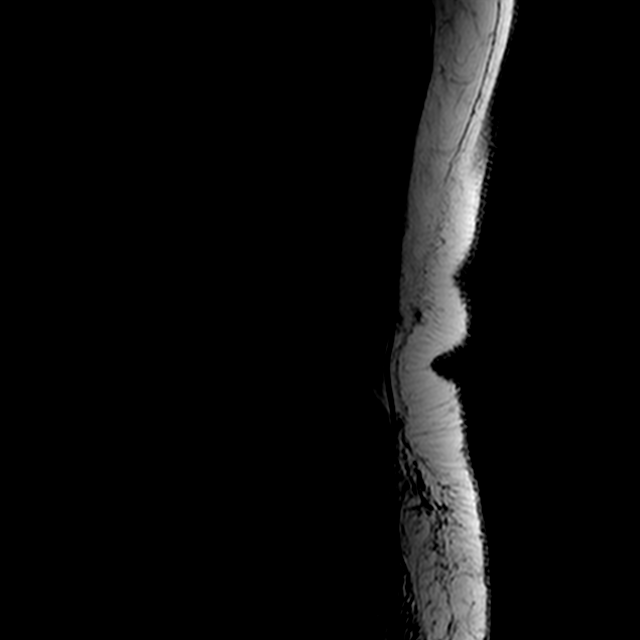
[im 8/15]
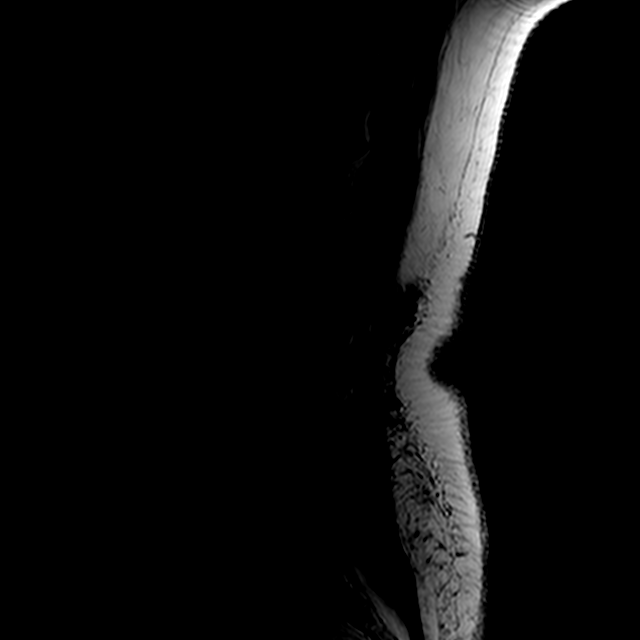
[im 12/15]
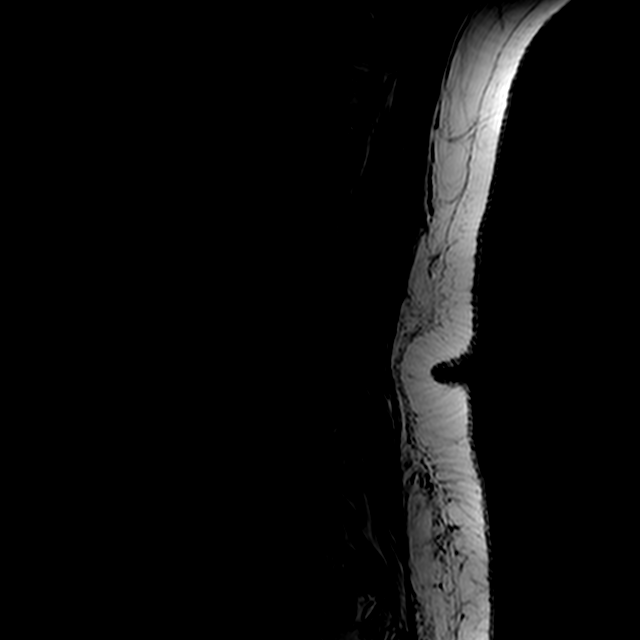

[Series 5: T2 · axial · 4.0mm · 0.39mm/px · z∈[-115,+26]mm · 3 of 33 slices shown (2 of 2)]
[im 5/33]
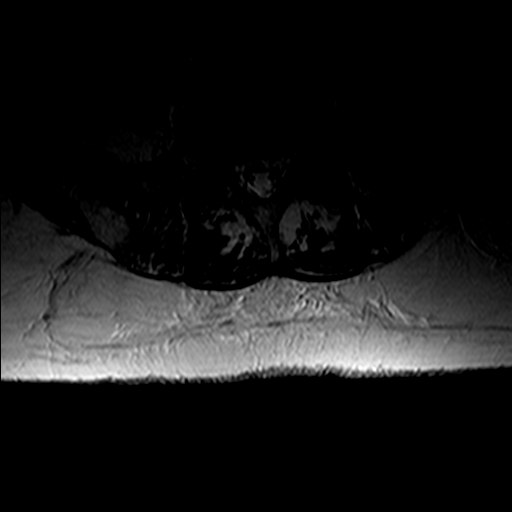
[im 18/33]
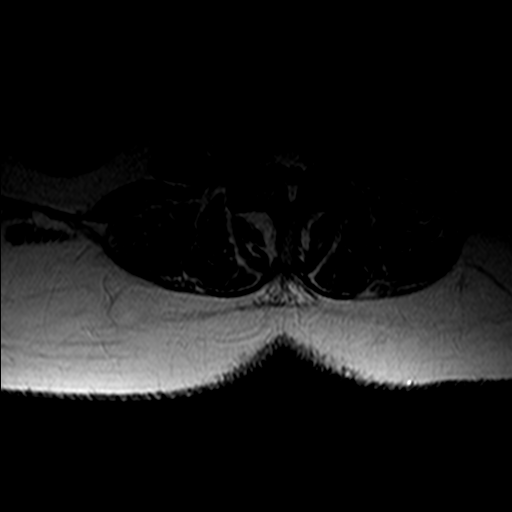
[im 28/33]
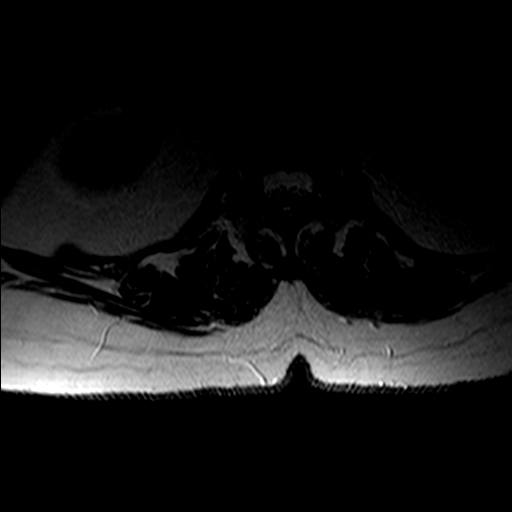

[Series 6: T1 · axial · 4.0mm · 0.39mm/px · z∈[-115,+26]mm · 3 of 33 slices shown (2 of 2)]
[im 5/33]
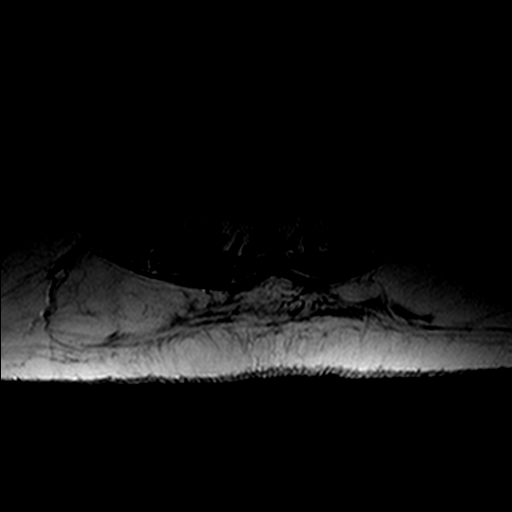
[im 18/33]
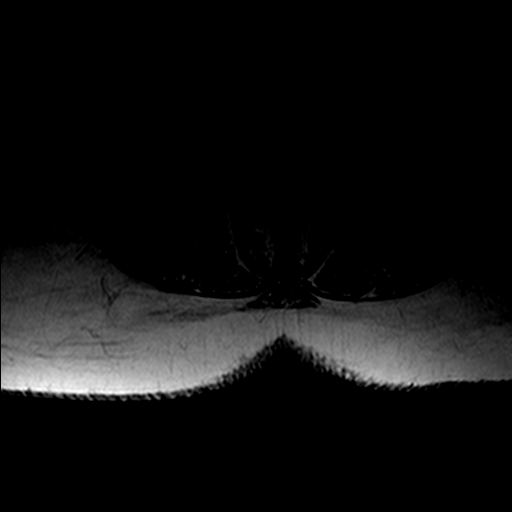
[im 28/33]
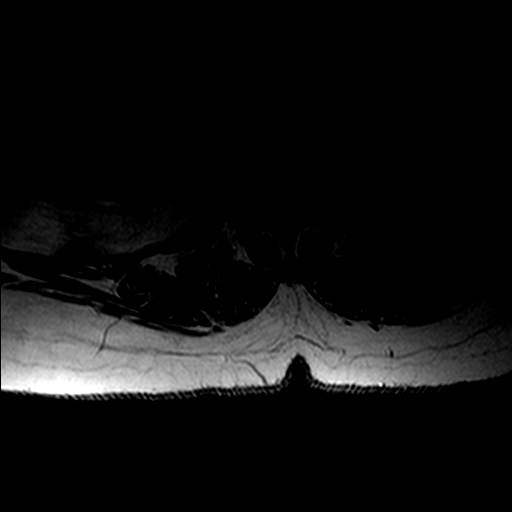

[12 of 48 positions shown; findings below may reference images not displayed]

FINDINGS: Normal conus tip at L1-2.  Normal paraspinal soft tissues.

T11-12: Small asymmetric disc protrusion to the right of midline,
unchanged. This is incompletely visualized on this lumbar MRI.

T12-L1: Prominent chronic central subligamentous disc extrusion
extending inferiorly in the midline behind the body of L1 without
neural impingement, unchanged.

L1-2:  Normal.

L2-3: Increased disc space narrowing. Right foraminal and extra
foraminal disc protrusion, new which should affect the right L2
nerve in and lateral to the neural foramen. Small disc bulge to the
left of midline without neural impingement. Minimal narrowing of the
spinal canal, new.

L2-3: Increased degenerative disc disease with 3.5 mm
spondylolisthesis with a broad-based protrusion of the uncovered
disc extending foraminal and extra foraminal on the right with
moderately severe foraminal stenosis, increased. This could affect
the right L3 nerve. Moderate left foraminal stenosis without neural
impingement. Slight bilateral facet arthritis with slightly
increased mild narrowing of the spinal canal.

L4-5: Increased degenerative disc disease with 6 mm
spondylolisthesis with a broad-based protrusion of the uncovered
disc extending foraminal and extra foraminal bilaterally. The L4
nerves appear to exit without impingement. The hypertrophied
ligamentum flavum and facet joints severely compressed both lateral
recesses and both L5 nerves, unchanged. Transverse dimension of the
spinal canal is diminished, unchanged.

L5-S1: Chronic disc space narrowing. Tiny broad-based disc bulge
with no neural impingement. Moderately severe left and slight right
facet arthritis, unchanged. No focal neural impingement.
IMPRESSION: 1. Severe bilateral lateral recess compression at L4-5 which should
affect both L5 nerves with stable spondylolisthesis.
2. New right foraminal and extra foraminal disc protrusion at L2-3
which should affect the right L2 to nerve.
3. Right foraminal and extra foraminal protrusion at L3-4 which
could affect the right L3 nerve.

## 2016-05-12 DIAGNOSIS — G8929 Other chronic pain: Secondary | ICD-10-CM | POA: Diagnosis not present

## 2016-05-12 DIAGNOSIS — M545 Low back pain: Secondary | ICD-10-CM | POA: Diagnosis not present

## 2016-05-12 DIAGNOSIS — M4316 Spondylolisthesis, lumbar region: Secondary | ICD-10-CM | POA: Diagnosis not present

## 2016-05-19 ENCOUNTER — Ambulatory Visit
Admission: RE | Admit: 2016-05-19 | Discharge: 2016-05-19 | Disposition: A | Discharge status: Home / Self Care | Payer: Medicare Other | Source: Ambulatory Visit | Attending: Neurology | Admitting: Neurology

## 2016-05-19 DIAGNOSIS — M545 Low back pain: Secondary | ICD-10-CM

## 2016-05-19 DIAGNOSIS — M48061 Spinal stenosis, lumbar region without neurogenic claudication: Secondary | ICD-10-CM | POA: Diagnosis not present

## 2016-05-19 DIAGNOSIS — M47816 Spondylosis without myelopathy or radiculopathy, lumbar region: Secondary | ICD-10-CM | POA: Diagnosis not present

## 2016-05-19 DIAGNOSIS — G8929 Other chronic pain: Secondary | ICD-10-CM | POA: Insufficient documentation

## 2016-05-19 DIAGNOSIS — M4316 Spondylolisthesis, lumbar region: Secondary | ICD-10-CM | POA: Insufficient documentation

## 2016-05-19 DIAGNOSIS — M5126 Other intervertebral disc displacement, lumbar region: Secondary | ICD-10-CM | POA: Insufficient documentation

## 2016-05-26 ENCOUNTER — Telehealth: Payer: Self-pay

## 2016-05-26 DIAGNOSIS — M5416 Radiculopathy, lumbar region: Secondary | ICD-10-CM | POA: Diagnosis not present

## 2016-05-26 DIAGNOSIS — M5441 Lumbago with sciatica, right side: Secondary | ICD-10-CM | POA: Diagnosis not present

## 2016-05-26 DIAGNOSIS — M4310 Spondylolisthesis, site unspecified: Secondary | ICD-10-CM | POA: Diagnosis not present

## 2016-05-26 DIAGNOSIS — G8929 Other chronic pain: Secondary | ICD-10-CM | POA: Diagnosis not present

## 2016-05-26 NOTE — Telephone Encounter (Signed)
received a fax requesting a refill fro xanax.

## 2016-05-26 NOTE — Telephone Encounter (Signed)
Pt need to make a follow up appointment

## 2016-05-28 ENCOUNTER — Telehealth: Payer: Self-pay

## 2016-05-28 NOTE — Telephone Encounter (Signed)
pt called states that she is out of alprazolam.  pt states she needs enough to make it to her appt for next week.  ( pt has appt 06-02-16

## 2016-06-01 DIAGNOSIS — R110 Nausea: Secondary | ICD-10-CM | POA: Diagnosis not present

## 2016-06-01 DIAGNOSIS — R6881 Early satiety: Secondary | ICD-10-CM | POA: Diagnosis not present

## 2016-06-01 DIAGNOSIS — R109 Unspecified abdominal pain: Secondary | ICD-10-CM | POA: Diagnosis not present

## 2016-06-01 IMAGING — CR LEFT WRIST - COMPLETE 3+ VIEW
1 series · 4 of 4 positions shown · non-contrast
Comparison: None.

CLINICAL DATA: Fall over curb on to left wrist with deformity and
pain. Initial encounter.

EXAM:
LEFT WRIST - COMPLETE 3+ VIEW

[Series 1: dxr wrist lt comp with obliques · 0.14mm/px · 4 of 4 slices shown]
[im 1/4]
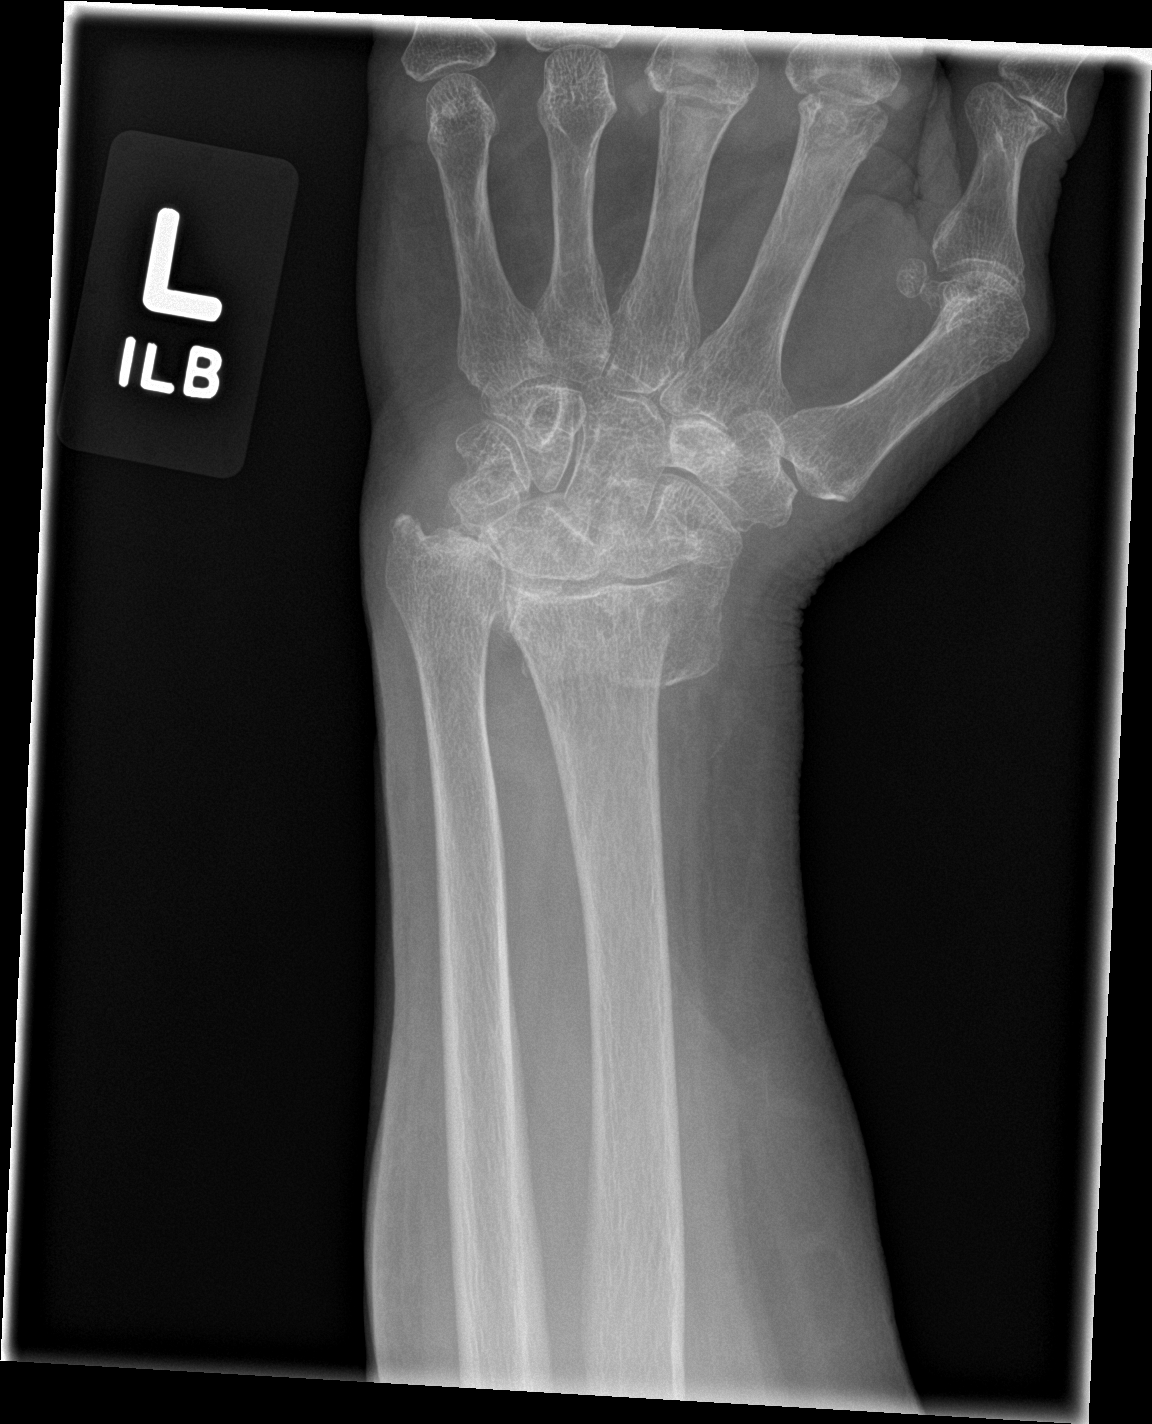
[im 2/4]
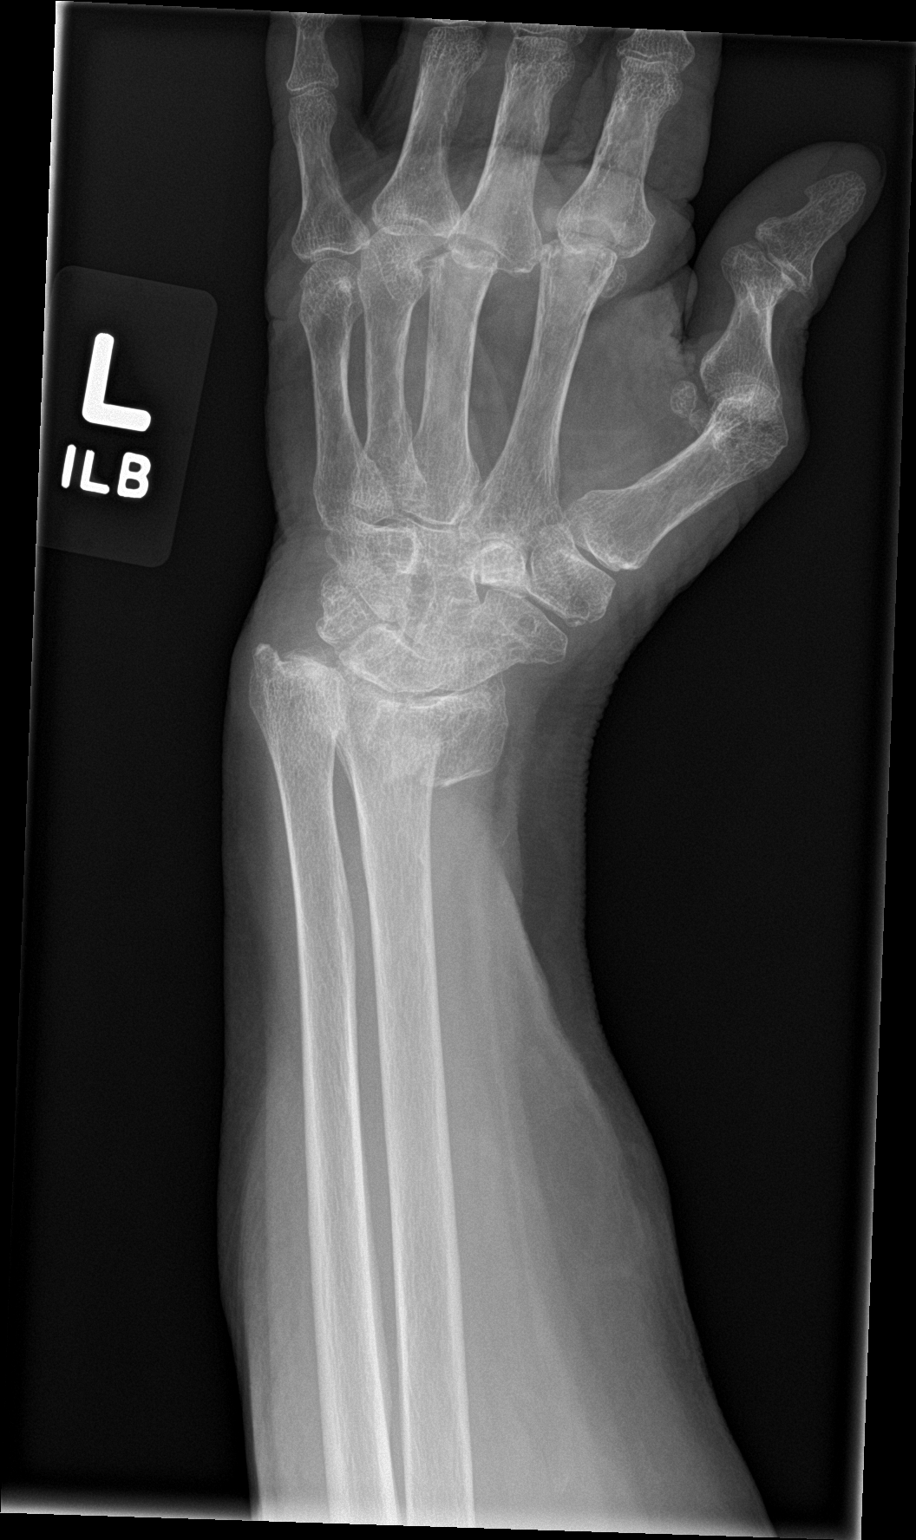
[im 3/4]
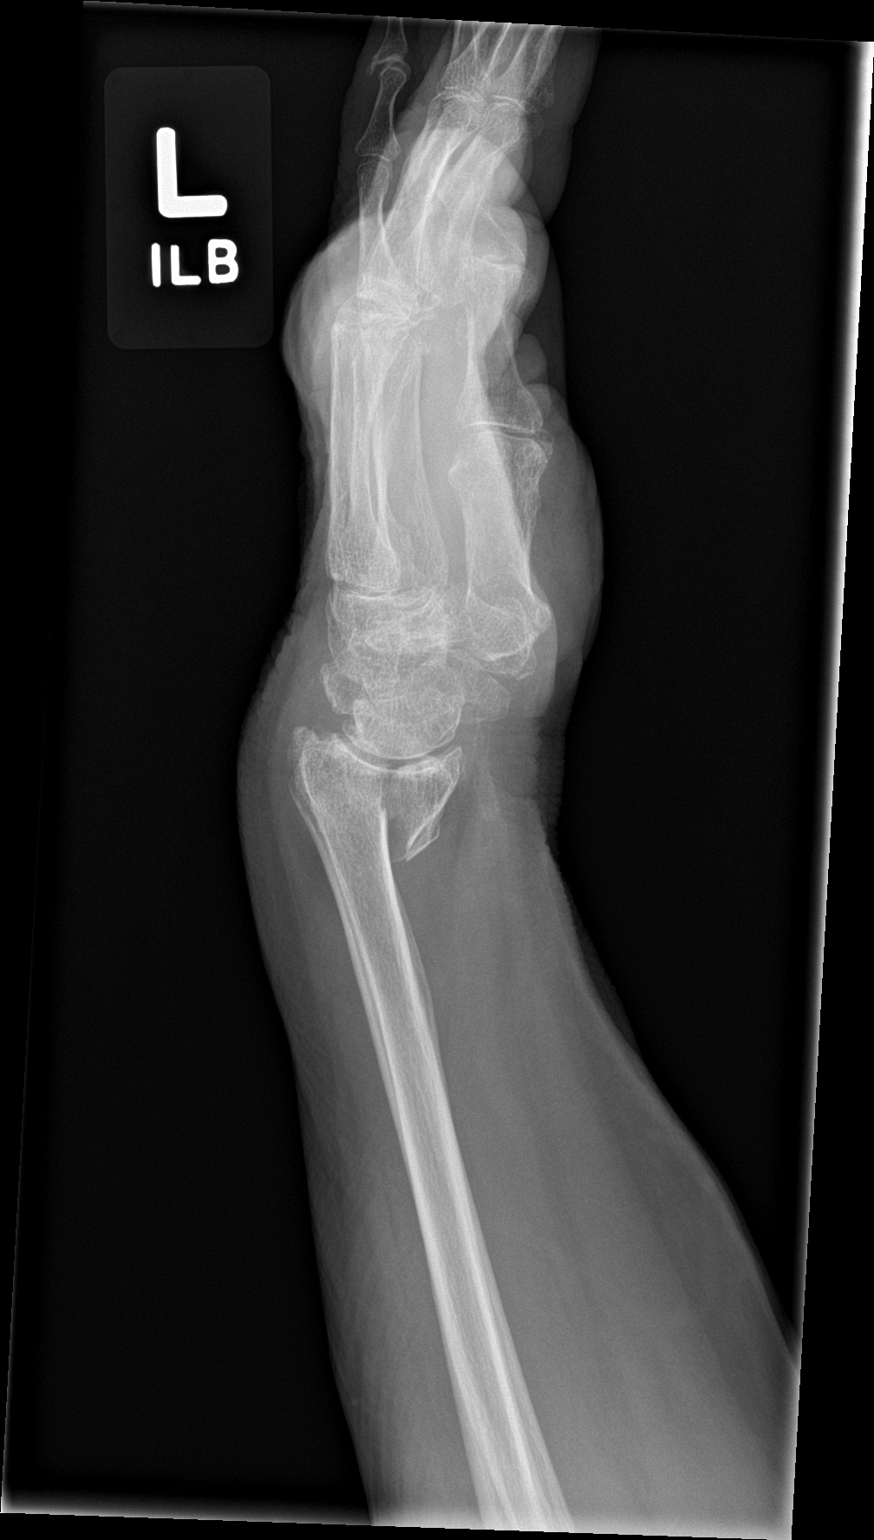
[im 4/4]
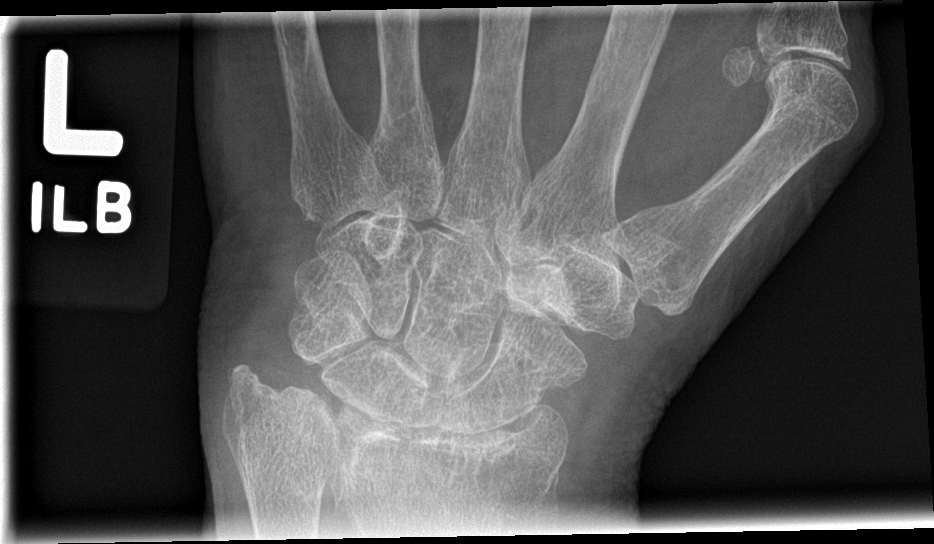

[4 of 4 positions shown; findings below may reference images not displayed]

FINDINGS: Acute fracture of the distal radial metaphysis noted with
significant impaction, volar displacement and mild volar angulation.
The distal ulna is likely subluxed into the carpus without visible
ulnar fracture. Carpal bone show degenerative changes without
visible fracture or dislocation. Diffuse soft tissue swelling
present.
IMPRESSION: Displaced distal radial fracture with impaction and volar
displacement. Distal ulna likely subluxed without distal ulnar
fracture identified.

## 2016-06-01 IMAGING — CR LEFT WRIST - 2 VIEW
1 series · 2 of 2 positions shown · non-contrast
Comparison: 09/19/2014

CLINICAL DATA: Post reduction

EXAM:
LEFT WRIST - 2 VIEW

[Series 1: pa · 0.17mm/px · 2 of 2 slices shown]
[im 1/2]
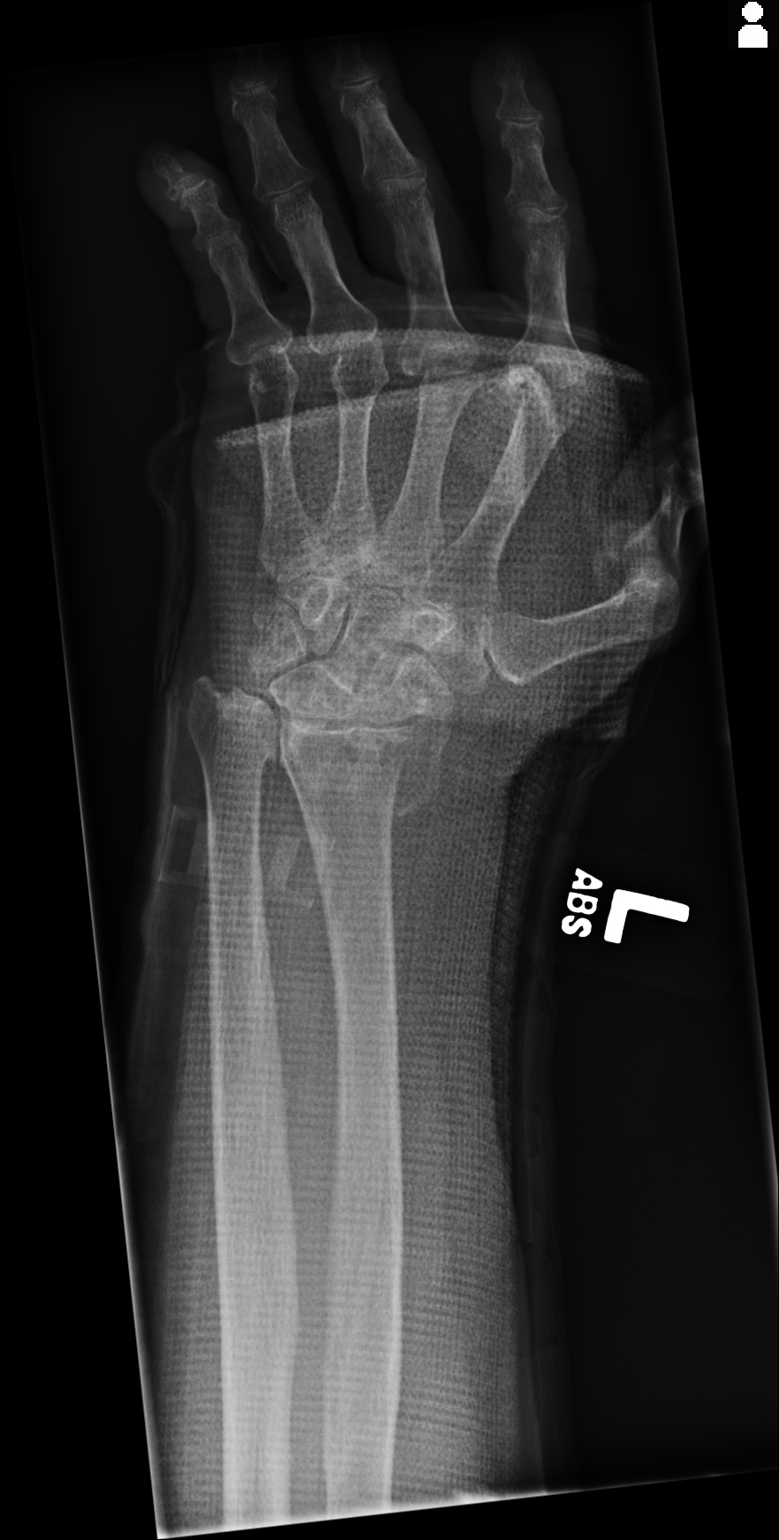
[im 2/2]
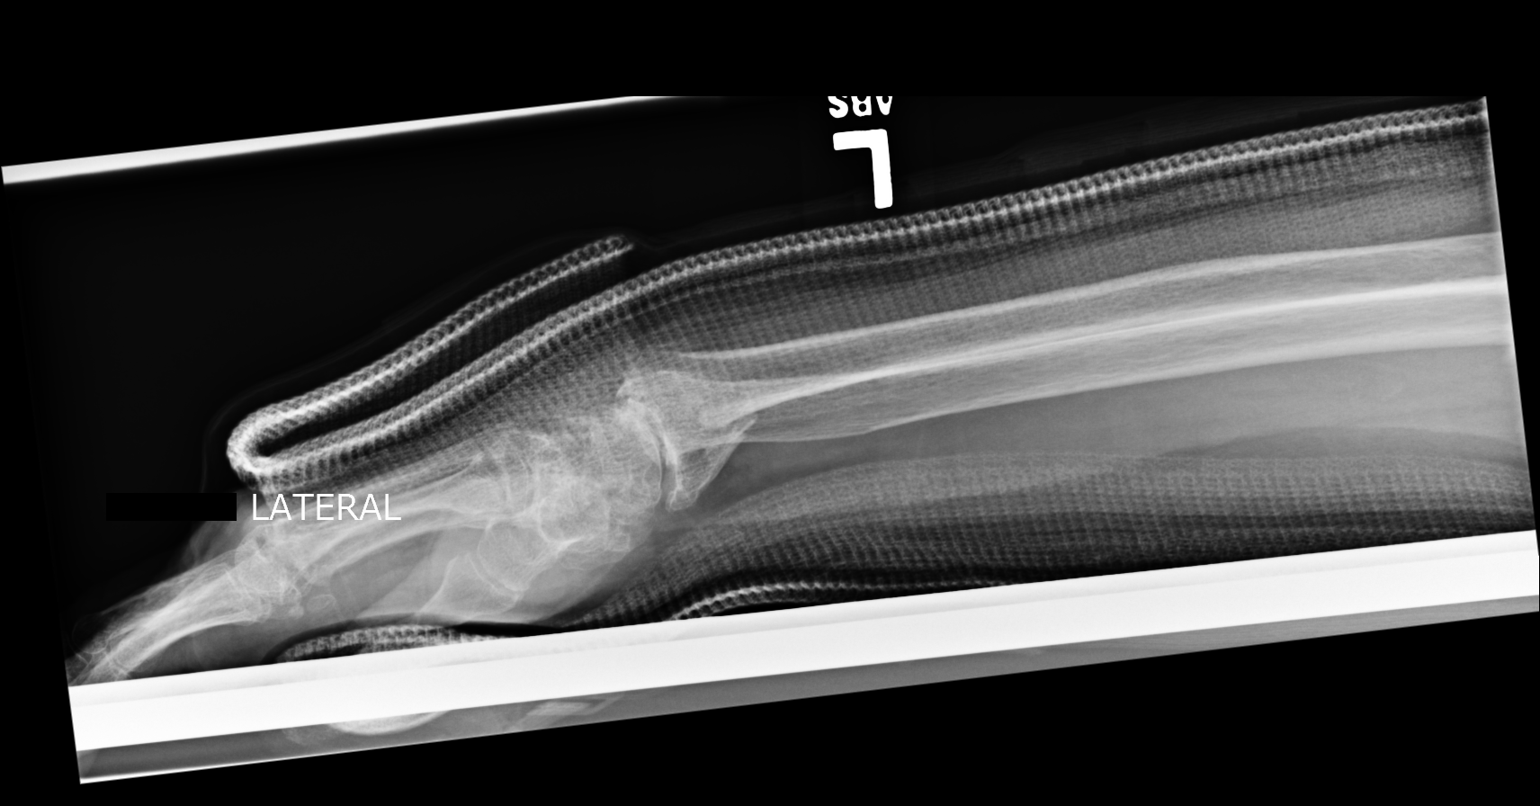

[2 of 2 positions shown; findings below may reference images not displayed]

FINDINGS: In cast views of the left wrist demonstrate the comminuted impacted
distal left radial fracture. Slight decreased displacement
anteriorly. Mild displacement persists.
IMPRESSION: Decreasing anterior displacement of the distal radial fragments.
Mild displacement persists.

## 2016-06-02 ENCOUNTER — Ambulatory Visit (INDEPENDENT_AMBULATORY_CARE_PROVIDER_SITE_OTHER): Payer: 59 | Admitting: Psychiatry

## 2016-06-02 ENCOUNTER — Other Ambulatory Visit: Payer: Self-pay | Admitting: Student

## 2016-06-02 ENCOUNTER — Encounter: Payer: Self-pay | Admitting: Psychiatry

## 2016-06-02 VITALS — BP 146/85 | HR 112 | Temp 98.9°F | Wt 139.4 lb

## 2016-06-02 DIAGNOSIS — F411 Generalized anxiety disorder: Secondary | ICD-10-CM | POA: Diagnosis not present

## 2016-06-02 DIAGNOSIS — R634 Abnormal weight loss: Secondary | ICD-10-CM

## 2016-06-02 DIAGNOSIS — R630 Anorexia: Secondary | ICD-10-CM

## 2016-06-02 DIAGNOSIS — F3160 Bipolar disorder, current episode mixed, unspecified: Secondary | ICD-10-CM | POA: Diagnosis not present

## 2016-06-02 DIAGNOSIS — R6881 Early satiety: Secondary | ICD-10-CM

## 2016-06-02 MED ORDER — LAMOTRIGINE 100 MG PO TABS
100.0000 mg | ORAL_TABLET | Freq: Every day | ORAL | 3 refills | Status: DC
Start: 2016-06-02 — End: 2016-06-02

## 2016-06-02 MED ORDER — LAMOTRIGINE 100 MG PO TABS: 100.0000 mg | ORAL_TABLET | Freq: Every day | ORAL | 3 refills | Status: DC

## 2016-06-02 MED ORDER — ALPRAZOLAM 0.25 MG PO TABS
0.2500 mg | ORAL_TABLET | Freq: Every evening | ORAL | 2 refills | Status: DC | PRN
Start: 2016-06-02 — End: 2016-09-11

## 2016-06-02 MED ORDER — ESCITALOPRAM OXALATE 10 MG PO TABS: 10.0000 mg | ORAL_TABLET | Freq: Every morning | ORAL | 1 refills | Status: DC

## 2016-06-02 NOTE — Progress Notes (Signed)
[facility] MD/PA/NP OP Progress Note  06/02/2016 10:16 AM Meghan [name]  MRN:  [id number]  Subjective:   Meghan [name]  is a 70year-old female who presented for the follow-up appointment. She reported that she continues to feel nauseated. She went to see her primary care physician and they have referred her to a gastroenterologist. She went to see a gastroenterologist yesterday and we have started her on the medication. She reported that she has improved her diet. She reported that she is trying to eat healthy. Patient stated that she is feeling sick today due to the change in the weather. She appeared calm and alert during the interview. We discussed about her medications. She has been compliant with them and has been taking Xanax on a when necessary basis. She sleeps well at night. She currently denied having any suicidal ideations or plans. She denied having any perceptual disturbances. She has good relationship with her family members. She is planning for the holidays at this time.   She currently denied having any suicidal ideations or plans.   Chief Complaint:  Chief Complaint    Follow-up; Medication Refill     Visit Diagnosis:     ICD-9-CM ICD-10-CM   1. Bipolar I disorder, most recent episode mixed ([place]) 296.60 F31.60   2. Anxiety state 300.00 F41.1     Past Medical History:  Past Medical History:  Diagnosis Date  . Anginal pain ([place])   . Anxiety   . Arthritis    RA  . Asthma   . Brain tumor (benign) ([place])   . COPD (chronic obstructive pulmonary disease) ([place])   . Coronary artery disease   . DDD (degenerative disc disease)   . Depression   . Diabetes mellitus   . GERD (gastroesophageal reflux disease)   . Headache   . Heart murmur   . Heart murmur   . Hypercholesteremia   . Hypertension   . Lumbar degenerative disc disease   . Migraines   . Obese   . Obesity   . Osteopenia   . Osteoporosis   . Pneumonia   . PTSD (post-traumatic stress disorder)     Past Surgical History:   Procedure Laterality Date  . ABDOMINAL HYSTERECTOMY    . CERVICAL FUSION    . CHOLECYSTECTOMY    . COLONOSCOPY WITH PROPOFOL N/A 09/20/2015   Procedure: COLONOSCOPY WITH PROPOFOL;  Surgeon: Josefine Class, MD;  Location: [facility] ENDOSCOPY;  Service: Endoscopy;  Laterality: N/A;  . COLONOSCOPY WITH PROPOFOL N/A 01/27/2016   Procedure: COLONOSCOPY WITH PROPOFOL;  Surgeon: Manya Silvas, MD;  Location: [facility] ENDOSCOPY;  Service: Endoscopy;  Laterality: N/A;  . FINGER ARTHROPLASTY  02/23/2012   Procedure: FINGER ARTHROPLASTY;  Surgeon: Cammie Sickle., MD;  Location: [place];  Service: Orthopedics;  Laterality: Left;  Extensor carpi radialis longus to Extensor carpi ulnaris transfer, left Metaphalangeal reconstructions of index and long fingers,  . FOOT ARTHROPLASTY     toes x2 rt foot  . HAND RECONSTRUCTION  2011   right-multiple finger joint reconst  . JOINT REPLACEMENT     bilat knee replacements   Family History:  Family History  Problem Relation Age of Onset  . Depression Sister   . Hypertension Mother   . Arthritis/Rheumatoid Mother   . Heart attack Father   . Hypertension Father   . CAD    . Hypertension    . Diabetes Mellitus II    . Arthritis     Social History:  Social History   Social History  . Marital status: Divorced    Spouse name: N/A  . Number of children: N/A  . Years of education: N/A   Social History Main Topics  . Smoking status: Never Smoker  . Smokeless tobacco: Never Used  . Alcohol use No  . Drug use: No  . Sexual activity: No   Other Topics Concern  . None   Social History Narrative  . None   Additional History:  Patient currently lives with her family members   Assessment:   Musculoskeletal: Strength & Muscle Tone: within normal limits Gait & Station: normal Patient leans: N/A  Psychiatric Specialty Exam: Medication Refill  Pertinent negatives include no chills, nausea or rash.  Anxiety  Symptoms include  dizziness and nervous/anxious behavior. Patient reports no nausea or palpitations.    Depression         Past medical history includes anxiety.     Review of Systems  Constitutional: Negative for chills.  HENT: Negative for hearing loss.   Eyes: Negative for photophobia.  Respiratory: Negative for sputum production.   Cardiovascular: Negative for palpitations.  Gastrointestinal: Negative for nausea.  Genitourinary: Negative for frequency.  Musculoskeletal: Positive for back pain.  Skin: Negative for rash.  Neurological: Positive for dizziness. Negative for tingling.  Endo/Heme/Allergies: Negative for environmental allergies.  Psychiatric/Behavioral: Positive for depression. Negative for hallucinations. The patient is nervous/anxious.   All other systems reviewed and are negative.   Blood pressure (!) 146/85, pulse (!) 112, temperature 98.9 F (37.2 C), temperature source Oral, weight 139 lb 6.4 oz (63.2 kg).Body mass index is 30.17 kg/m.  General Appearance: Casual  Eye Contact:  Fair  Speech:  Clear and Coherent  Volume:  Normal  Mood:  Anxious  Affect:  Congruent  Thought Process:  Goal Directed  Orientation:  Full (Time, Place, and Person)  Thought Content:  Negative and NA  Suicidal Thoughts:  No  Homicidal Thoughts:  No  Memory:  Immediate;   Fair  Judgement:  Fair  Insight:  Fair  Psychomotor Activity:  Normal  Concentration:  Fair  Recall:  [employer] of Knowledge: Fair  Language: Fair  Akathisia:  No  Handed:  Right  AIMS (if indicated):  none  Assets:  Communication Skills Desire for Improvement Physical Health  ADL's:  Intact  Cognition: WNL  Sleep:  6-7    Is the patient at risk to self?  No. Has the patient been a risk to self in the past 6 months?  No. Has the patient been a risk to self within the distant past?  No. Is the patient a risk to others?  No. Has the patient been a risk to others in the past 6 months?  No. Has the patient been a risk  to others within the distant past?  No.  Current Medications: Current Outpatient Prescriptions  Medication Sig Dispense Refill  . acyclovir ointment (ZOVIRAX) 5 % Apply topically every 3 (three) hours. Use as needed for blistering. 5 g 0  . albuterol (PROVENTIL HFA;VENTOLIN HFA) 108 (90 BASE) MCG/ACT inhaler Inhale 2 puffs into the lungs every 6 (six) hours as needed for wheezing or shortness of breath.     Marland Kitchen alendronate (FOSAMAX) 70 MG tablet Take 70 mg by mouth once a week. Pt takes on Monday.    . ALPRAZolam (XANAX) 0.25 MG tablet Take 1 tablet (0.25 mg total) by mouth at bedtime as needed for anxiety. 15 tablet 2  . budesonide-formoterol (  SYMBICORT) 80-4.5 MCG/ACT inhaler Inhale 2 puffs into the lungs 2 (two) times daily.    . butalbital-acetaminophen-caffeine (FIORICET) 50-325-40 MG tablet Take 1-2 tablets by mouth every 6 (six) hours as needed for headache. 20 tablet 0  . Cholecalciferol (VITAMIN D) 2000 UNITS CAPS Take 1 capsule by mouth daily.    . ciclopirox (PENLAC) 8 % solution     . cloNIDine (CATAPRES) 0.1 MG tablet Take 0.1 mg by mouth 2 (two) times daily.    Marland Kitchen escitalopram (LEXAPRO) 10 MG tablet Take 1 tablet (10 mg total) by mouth every morning. 90 tablet 1  . esomeprazole (NEXIUM) 40 MG capsule Take 40 mg by mouth at bedtime.    . insulin starter kit- pen needles MISC 1 kit by Other route once. 1 kit 0  . isosorbide mononitrate (IMDUR) 30 MG 24 hr tablet Take 30 mg by mouth daily.    Marland Kitchen ketoconazole (NIZORAL) 2 % cream Apply 1 application topically at bedtime.    . lamoTRIgine (LAMICTAL) 100 MG tablet Take 1 tablet (100 mg total) by mouth daily. 90 tablet 3  . levocetirizine (XYZAL) 5 MG tablet Take 5 mg by mouth daily.     . meloxicam (MOBIC) 7.5 MG tablet     . nystatin (MYCOSTATIN) 100000 UNIT/ML suspension     . ONE TOUCH ULTRA TEST test strip     . potassium chloride SA (K-DUR,KLOR-CON) 20 MEQ tablet     . predniSONE (DELTASONE) 5 MG tablet Take 5 mg by mouth at bedtime.      . pregabalin (LYRICA) 50 MG capsule Take 50 mg by mouth 2 (two) times daily.    . pregabalin (LYRICA) 50 MG capsule TAKE ONE CAPSULE BY MOUTH TWICE A DAY    . prochlorperazine (COMPAZINE) 5 MG tablet Take 5 mg by mouth every 6 (six) hours as needed for nausea or vomiting.    . saxagliptin HCl (ONGLYZA) 5 MG TABS tablet Take 5 mg by mouth at bedtime.    . Syringe, Disposable, 1 ML MISC 300 Syringes by Does not apply route daily. 300 each 0  . Tofacitinib Citrate 5 MG TABS Take 1 tablet by mouth 2 (two) times daily.      No current facility-administered medications for this visit.     Medical Decision Making:  Established Problem, Stable/Improving (1), Review and summation of old records (2) and Review of Last Therapy Session (1)  Treatment Plan Summary:Medication management    Discussed with patient about the medications  She  will continue on Lexapro 10 mg in the morning and lamotrigine 100 mg at bedtime. Continue Xanax on a when necessary basis.  Follow-up in 2 months or earlier depending on her symptoms.     More than 50% of the time spent in psychoeducation, counseling and coordination of care.    This note was generated in part or whole with voice recognition software. Voice regonition is usually quite accurate but there are transcription errors that can and very often do occur. I apologize for any typographical errors that were not detected and corrected.   Rainey Pines, MD  06/02/2016, 10:16 AM

## 2016-06-02 NOTE — Telephone Encounter (Signed)
pt has appt today

## 2016-06-08 ENCOUNTER — Ambulatory Visit: Payer: Medicare Other | Admitting: Pain Medicine

## 2016-06-16 DIAGNOSIS — M48062 Spinal stenosis, lumbar region with neurogenic claudication: Secondary | ICD-10-CM | POA: Diagnosis not present

## 2016-06-16 DIAGNOSIS — M5136 Other intervertebral disc degeneration, lumbar region: Secondary | ICD-10-CM | POA: Diagnosis not present

## 2016-06-16 DIAGNOSIS — M5416 Radiculopathy, lumbar region: Secondary | ICD-10-CM | POA: Diagnosis not present

## 2016-06-18 DIAGNOSIS — I10 Essential (primary) hypertension: Secondary | ICD-10-CM | POA: Diagnosis not present

## 2016-06-18 DIAGNOSIS — E1140 Type 2 diabetes mellitus with diabetic neuropathy, unspecified: Secondary | ICD-10-CM | POA: Diagnosis not present

## 2016-06-18 DIAGNOSIS — E876 Hypokalemia: Secondary | ICD-10-CM | POA: Diagnosis not present

## 2016-06-18 DIAGNOSIS — R110 Nausea: Secondary | ICD-10-CM | POA: Diagnosis not present

## 2016-06-18 DIAGNOSIS — E1165 Type 2 diabetes mellitus with hyperglycemia: Secondary | ICD-10-CM | POA: Diagnosis not present

## 2016-06-18 DIAGNOSIS — R5383 Other fatigue: Secondary | ICD-10-CM | POA: Diagnosis not present

## 2016-06-18 DIAGNOSIS — D509 Iron deficiency anemia, unspecified: Secondary | ICD-10-CM | POA: Diagnosis not present

## 2016-06-18 DIAGNOSIS — M069 Rheumatoid arthritis, unspecified: Secondary | ICD-10-CM | POA: Diagnosis not present

## 2016-06-18 DIAGNOSIS — E559 Vitamin D deficiency, unspecified: Secondary | ICD-10-CM | POA: Diagnosis not present

## 2016-06-18 DIAGNOSIS — Z0001 Encounter for general adult medical examination with abnormal findings: Secondary | ICD-10-CM | POA: Diagnosis not present

## 2016-06-22 ENCOUNTER — Ambulatory Visit: Payer: Medicare Other | Admitting: Pain Medicine

## 2016-06-23 ENCOUNTER — Encounter
Admission: RE | Admit: 2016-06-23 | Discharge: 2016-06-23 | Disposition: A | Discharge status: Home / Self Care | Payer: Medicare Other | Source: Ambulatory Visit | Attending: Student | Admitting: Student

## 2016-06-23 DIAGNOSIS — R630 Anorexia: Secondary | ICD-10-CM | POA: Diagnosis not present

## 2016-06-23 DIAGNOSIS — R634 Abnormal weight loss: Secondary | ICD-10-CM | POA: Diagnosis present

## 2016-06-23 DIAGNOSIS — R1030 Lower abdominal pain, unspecified: Secondary | ICD-10-CM | POA: Diagnosis not present

## 2016-06-23 DIAGNOSIS — R6881 Early satiety: Secondary | ICD-10-CM

## 2016-06-23 MED ORDER — TECHNETIUM TC 99M SULFUR COLLOID
2.0000 | Freq: Once | INTRAVENOUS | Status: AC | PRN
  Administered 2016-06-23: 2.4 via INTRAVENOUS

## 2016-06-30 ENCOUNTER — Other Ambulatory Visit: Payer: Self-pay | Admitting: *Deleted

## 2016-06-30 DIAGNOSIS — E782 Mixed hyperlipidemia: Secondary | ICD-10-CM | POA: Diagnosis not present

## 2016-06-30 DIAGNOSIS — I10 Essential (primary) hypertension: Secondary | ICD-10-CM | POA: Diagnosis not present

## 2016-06-30 DIAGNOSIS — E1140 Type 2 diabetes mellitus with diabetic neuropathy, unspecified: Secondary | ICD-10-CM | POA: Diagnosis not present

## 2016-06-30 DIAGNOSIS — J019 Acute sinusitis, unspecified: Secondary | ICD-10-CM | POA: Diagnosis not present

## 2016-06-30 DIAGNOSIS — J449 Chronic obstructive pulmonary disease, unspecified: Secondary | ICD-10-CM | POA: Diagnosis not present

## 2016-06-30 NOTE — Patient Outreach (Signed)
[facility]) Care Management  06/30/2016  Meghan [name] 08-29-1945 [place]:[id number]  Emmi-prevent referral:  Telephone call to patient who was advised of reason for call & [facility] care management services.   Patient voices that she is independent & cares for herself. Voices that she lives alone and manages her own medications, drives herself to primary care & specialists appointments (Rheumatologist, Pain management, Copywriter, advertising).   Patient voices that health conditions include HTN, Diabetes, Chronic back pain, Rheumatoid arthritis, Heart Condition,  Depression.  States conditions are under control.  Voices that she gets blood work done every 6 months for diabetes & states labs are okay. Patient does not know what normal values are for A1c  level. States she has taken outpatient diabetes classes but would like to learn more about diabetes. States currently taking insulin & oral medication & checks BS 3 times weekly. States blood sugar ranges from 135 -146 after eating.   Patient has consented to [facility] services for disease management for diabetes.   Plan: Refer to care management assistant to assign to Health Coach. Telephonic signing off.   Sherrin Daisy, RN BSN [place] Management Coordinator [facility] Care Management  [phone]

## 2016-07-01 ENCOUNTER — Ambulatory Visit: Payer: Medicare Other | Admitting: Pain Medicine

## 2016-07-07 DIAGNOSIS — M0579 Rheumatoid arthritis with rheumatoid factor of multiple sites without organ or systems involvement: Secondary | ICD-10-CM | POA: Diagnosis not present

## 2016-07-13 ENCOUNTER — Other Ambulatory Visit: Payer: Self-pay | Admitting: *Deleted

## 2016-07-13 NOTE — Patient Outreach (Signed)
[facility]) Care Management  07/13/2016  TORRIE ULCH 1946/03/14 [id number]   RN Health Coach attempted #1  Follow up outreach call to patient.  Per person answering phone patient had went to Dr office. HIPPA compliance message was left with return callback number.  Plan: RN will call patient again within 14 days.   [facility] Care Management [phone]

## 2016-07-14 DIAGNOSIS — G8929 Other chronic pain: Secondary | ICD-10-CM | POA: Diagnosis not present

## 2016-07-14 DIAGNOSIS — M0579 Rheumatoid arthritis with rheumatoid factor of multiple sites without organ or systems involvement: Secondary | ICD-10-CM | POA: Diagnosis not present

## 2016-07-14 DIAGNOSIS — M25511 Pain in right shoulder: Secondary | ICD-10-CM | POA: Diagnosis not present

## 2016-07-14 DIAGNOSIS — M810 Age-related osteoporosis without current pathological fracture: Secondary | ICD-10-CM | POA: Diagnosis not present

## 2016-07-14 DIAGNOSIS — M25512 Pain in left shoulder: Secondary | ICD-10-CM | POA: Diagnosis not present

## 2016-07-21 DIAGNOSIS — M48062 Spinal stenosis, lumbar region with neurogenic claudication: Secondary | ICD-10-CM | POA: Diagnosis not present

## 2016-07-21 DIAGNOSIS — M5136 Other intervertebral disc degeneration, lumbar region: Secondary | ICD-10-CM | POA: Diagnosis not present

## 2016-07-21 DIAGNOSIS — M5416 Radiculopathy, lumbar region: Secondary | ICD-10-CM | POA: Diagnosis not present

## 2016-07-22 ENCOUNTER — Ambulatory Visit: Payer: Self-pay | Admitting: *Deleted

## 2016-07-27 DIAGNOSIS — R0602 Shortness of breath: Secondary | ICD-10-CM | POA: Diagnosis not present

## 2016-07-27 DIAGNOSIS — R011 Cardiac murmur, unspecified: Secondary | ICD-10-CM | POA: Diagnosis not present

## 2016-07-27 DIAGNOSIS — I10 Essential (primary) hypertension: Secondary | ICD-10-CM | POA: Diagnosis not present

## 2016-07-27 DIAGNOSIS — I208 Other forms of angina pectoris: Secondary | ICD-10-CM | POA: Diagnosis not present

## 2016-07-28 ENCOUNTER — Telehealth: Payer: Self-pay

## 2016-07-28 ENCOUNTER — Ambulatory Visit: Payer: 59 | Admitting: Psychiatry

## 2016-07-28 NOTE — Telephone Encounter (Signed)
called in lamictal, laxapro, and xanax called in 30 day supply no additonal refills

## 2016-07-28 NOTE — Telephone Encounter (Signed)
pt states she needs to have enough medication to last until her app in [place]

## 2016-08-14 ENCOUNTER — Other Ambulatory Visit: Payer: Self-pay | Admitting: *Deleted

## 2016-08-14 ENCOUNTER — Encounter: Payer: Self-pay | Admitting: *Deleted

## 2016-08-14 NOTE — Patient Outreach (Signed)
[name]) Care Management  08/14/2016  Meghan [name] 03-27-1946 [place]:[id number]   RN Health Coach telephone call to patient.   Person pickes up phone and hangs it up without speaking. RN called again and patient answered. Hipaa compliance verified. RN introduced self. RN asked patient does she know the signs and symptoms or high and low blood sugar. Patient stated yes. Patient stated that her diabetes is doing good and that she takes her medications and knows about it and  does not need any further information.   Plan: Patient has agreed to participate and had completed the initial screening.  Patient then withdraws from the program.  [facility] Management [phone]

## 2016-08-17 DIAGNOSIS — R011 Cardiac murmur, unspecified: Secondary | ICD-10-CM | POA: Diagnosis not present

## 2016-08-17 DIAGNOSIS — R0602 Shortness of breath: Secondary | ICD-10-CM | POA: Diagnosis not present

## 2016-08-17 DIAGNOSIS — I208 Other forms of angina pectoris: Secondary | ICD-10-CM | POA: Diagnosis not present

## 2016-08-21 DIAGNOSIS — M0579 Rheumatoid arthritis with rheumatoid factor of multiple sites without organ or systems involvement: Secondary | ICD-10-CM | POA: Diagnosis not present

## 2016-08-21 DIAGNOSIS — G5602 Carpal tunnel syndrome, left upper limb: Secondary | ICD-10-CM | POA: Diagnosis not present

## 2016-08-21 DIAGNOSIS — M79642 Pain in left hand: Secondary | ICD-10-CM | POA: Diagnosis not present

## 2016-08-21 DIAGNOSIS — M19042 Primary osteoarthritis, left hand: Secondary | ICD-10-CM | POA: Diagnosis not present

## 2016-08-25 DIAGNOSIS — M4316 Spondylolisthesis, lumbar region: Secondary | ICD-10-CM | POA: Diagnosis not present

## 2016-08-25 DIAGNOSIS — M5416 Radiculopathy, lumbar region: Secondary | ICD-10-CM | POA: Diagnosis not present

## 2016-08-26 ENCOUNTER — Ambulatory Visit: Payer: 59 | Admitting: Psychiatry

## 2016-09-05 IMAGING — DX DG CHEST 2V
2 series · 2 of 2 positions shown · non-contrast
Comparison: Chest radiograph performed 11/29/2012

CLINICAL DATA: Acute onset of nausea.  Initial encounter.

EXAM:
CHEST  2 VIEW

[chest lat]
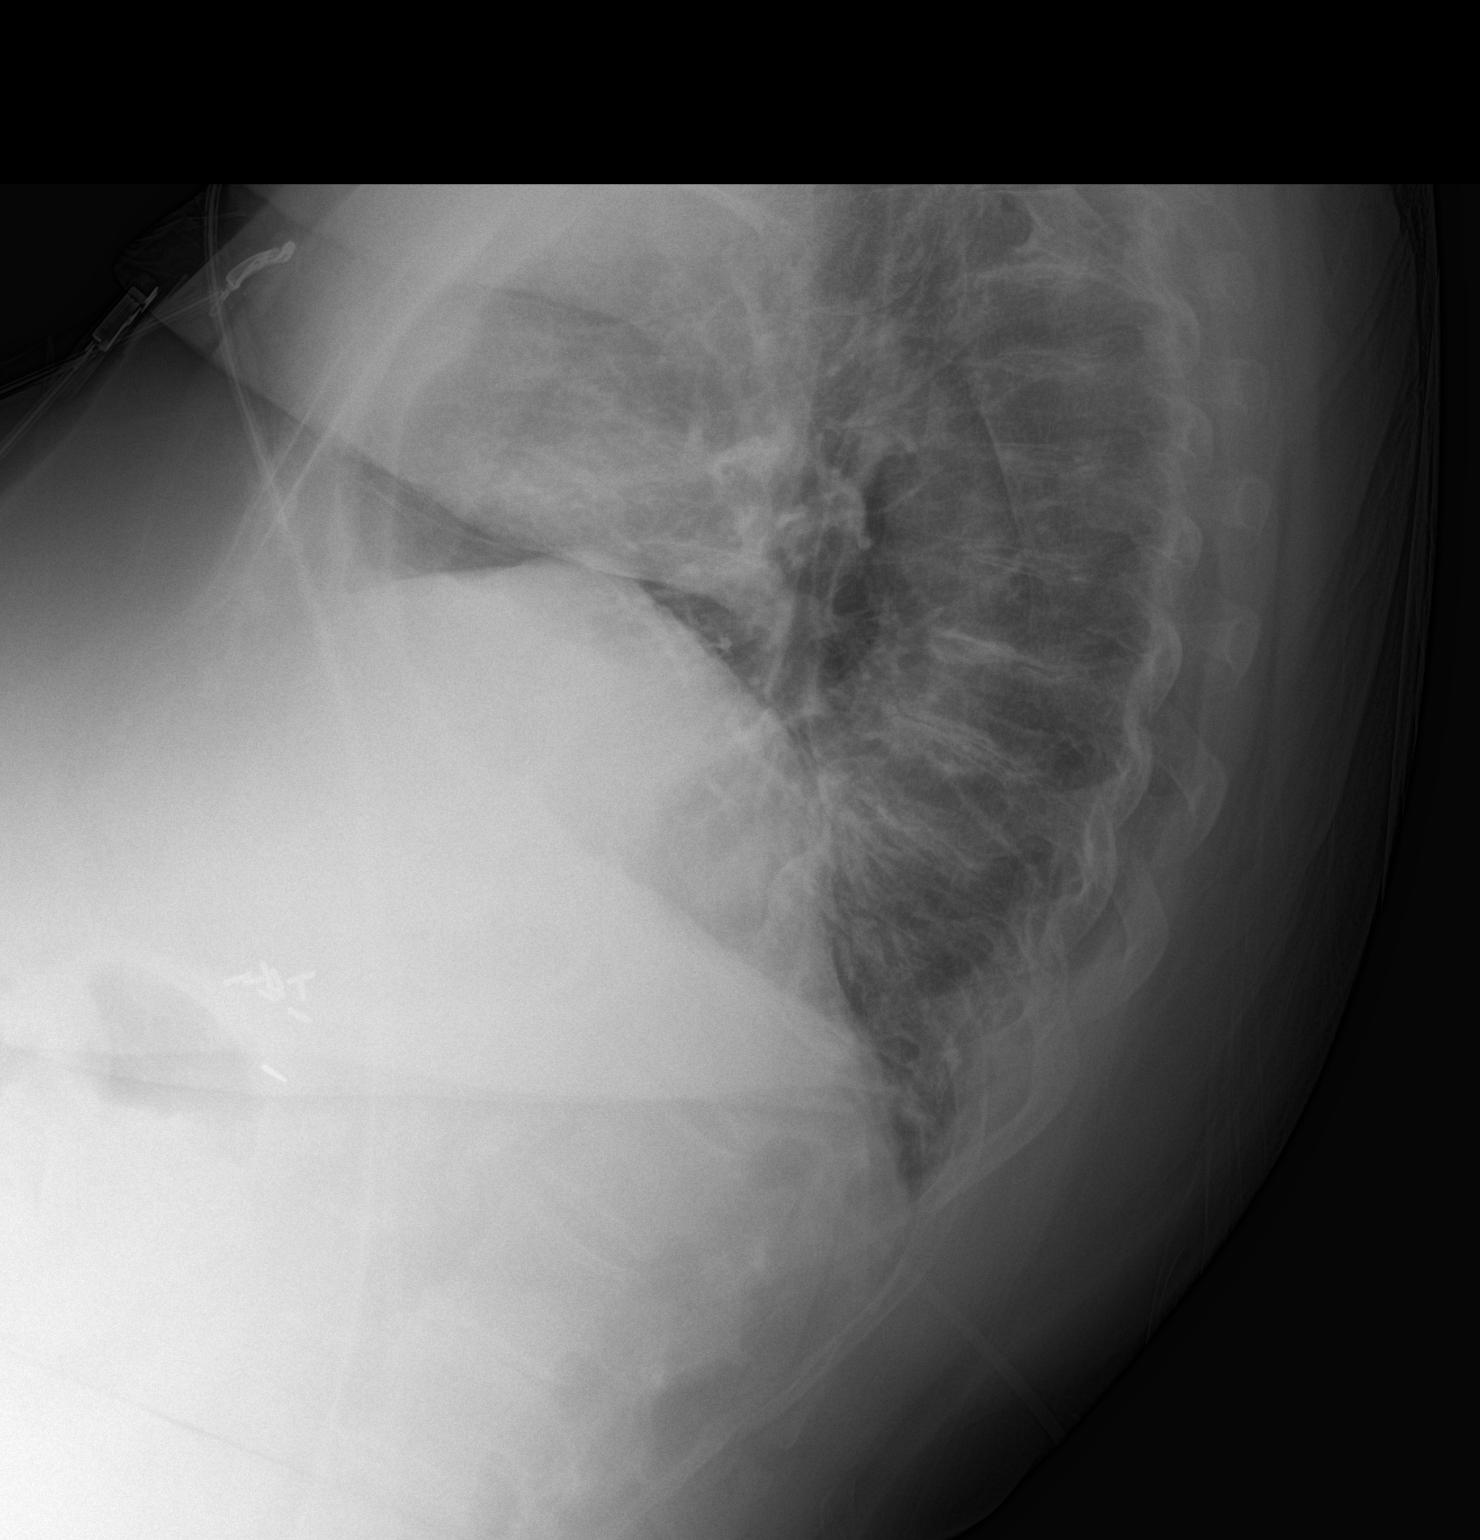

[chest ap]
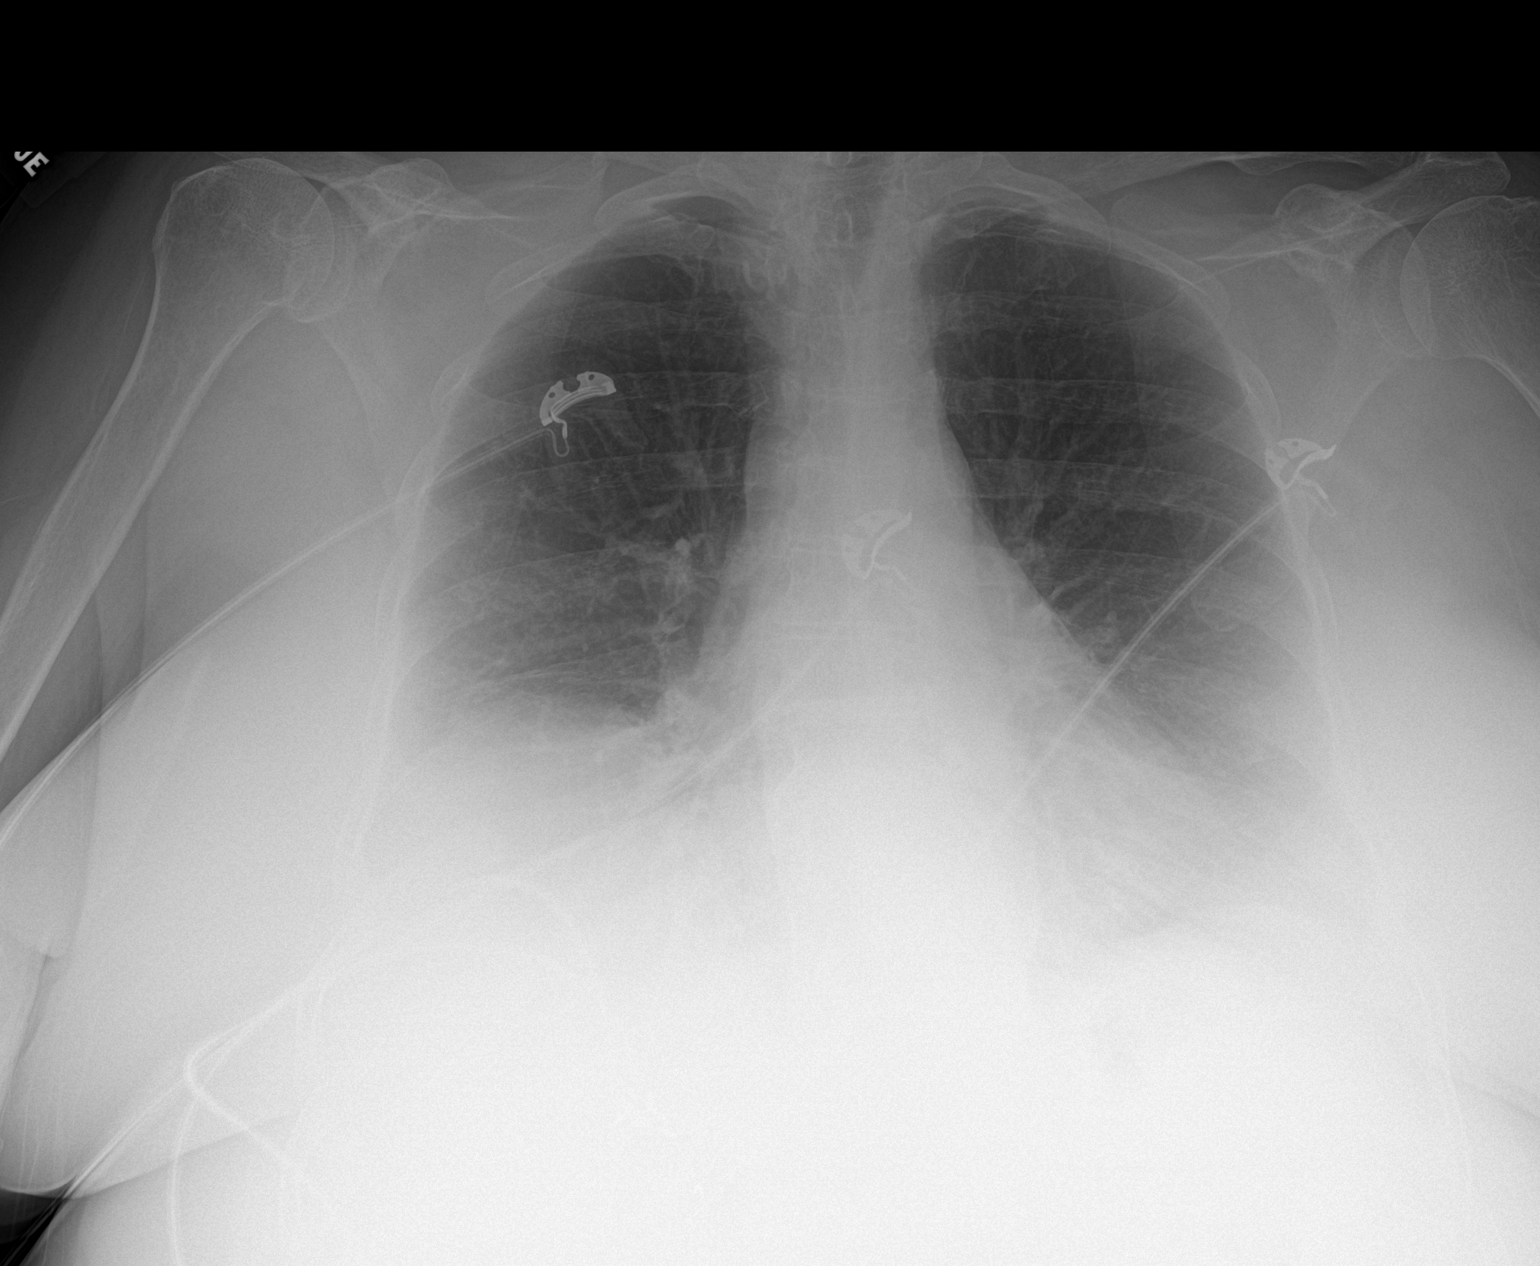

[2 of 2 positions shown; findings below may reference images not displayed]

FINDINGS: The lungs are well-aerated. There is stable elevation of the right
hemidiaphragm. There is no evidence of focal opacification, pleural
effusion or pneumothorax.

The heart is borderline enlarged. No acute osseous abnormalities are
seen. Clips are noted within the right upper quadrant, reflecting
prior cholecystectomy.
IMPRESSION: Stable elevation of the right hemidiaphragm. Borderline
cardiomegaly. Lungs remain grossly clear.

## 2016-09-05 IMAGING — CT CT HEAD W/O CM
4 series · 18 of 30 positions shown, 19 images · non-contrast
Comparison: CT of the head performed 09/01/2013, and MRI of the
brain performed 06/09/2012

CLINICAL DATA: Status post fall. Dizziness. Hit hard object.
Disorientation for 2 weeks. Initial encounter.

EXAM:
CT HEAD WITHOUT CONTRAST
TECHNIQUE: Contiguous axial images were obtained from the base of the skull
through the vertex without intravenous contrast.

[Series 2: soft tissue · axial · 0.41mm/px · z∈[-31,+19]mm · 2 of 30 slices shown, 3 images]
[im 10/30  brain]
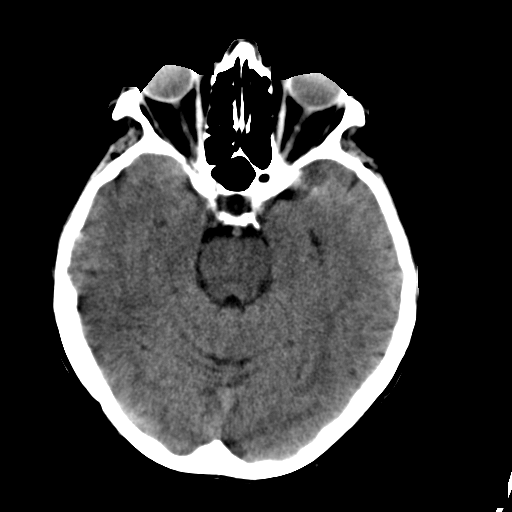
[im 10/30  bone]
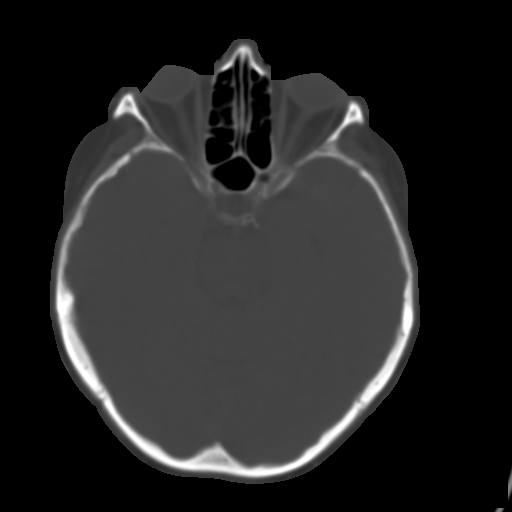
[im 20/30  brain]
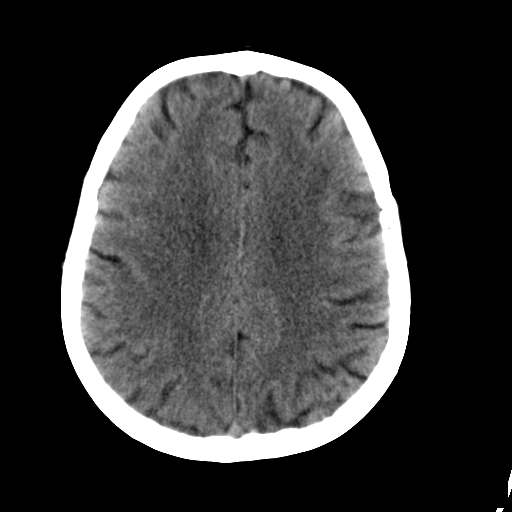

[Series 3: bone · axial · 0.41mm/px · z∈[-72,+66]mm · 8 of 85 slices shown]
[im 8/85  bone]
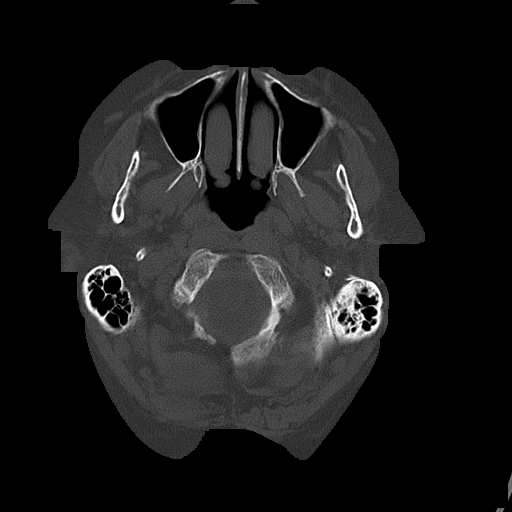
[im 16/85  bone]
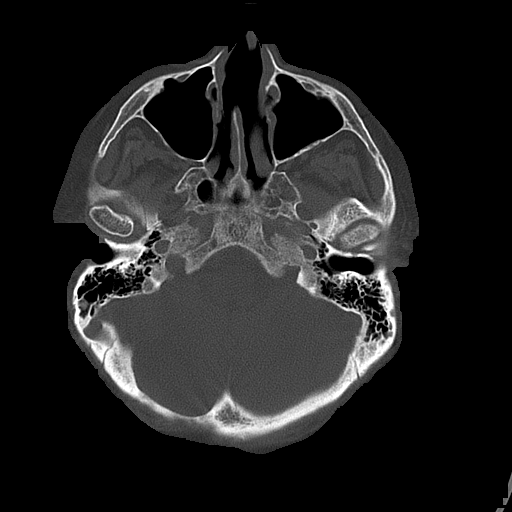
[im 31/85  bone]
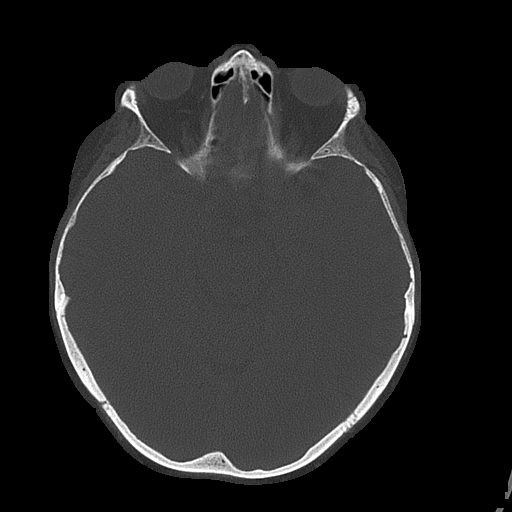
[im 39/85  bone]
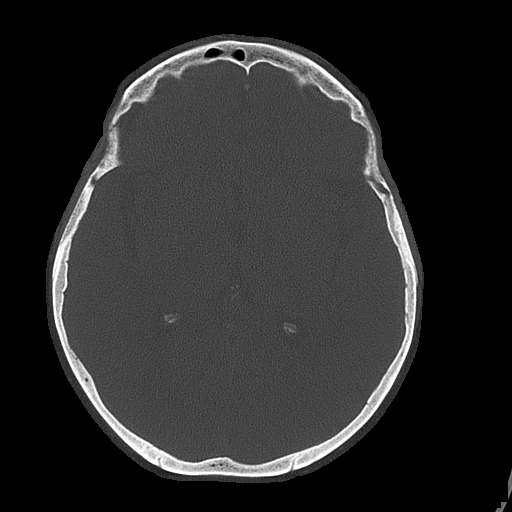
[im 46/85  bone]
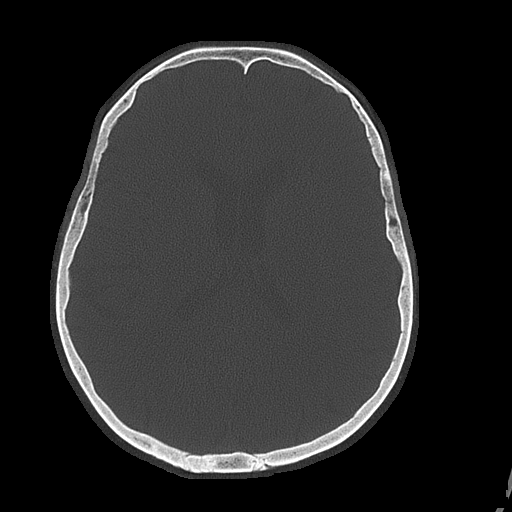
[im 54/85  bone]
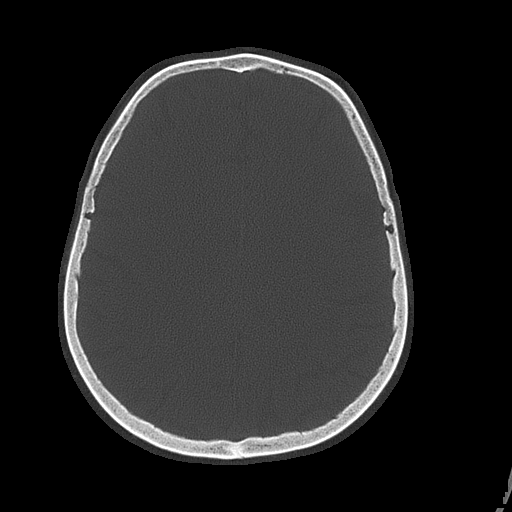
[im 69/85  bone]
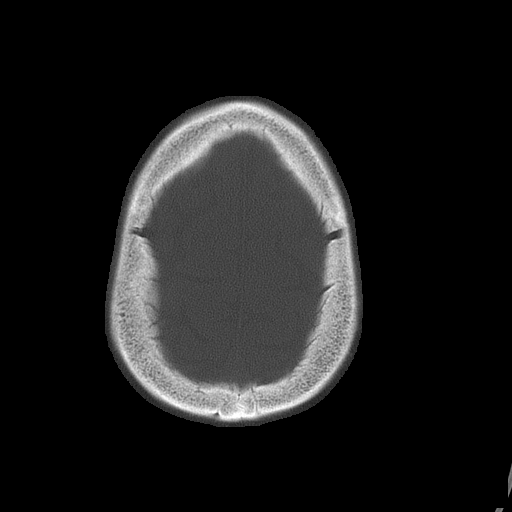
[im 77/85  bone]
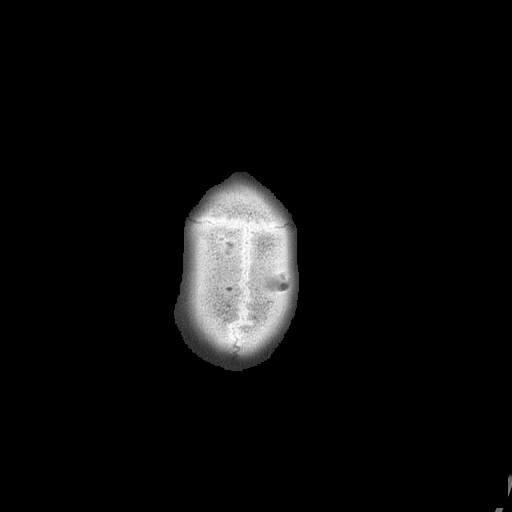

[Series 4: soft tissue recon · axial · 0.42mm/px · z∈[+2,+46]mm · 2 of 27 slices shown]
[im 9/27  brain]
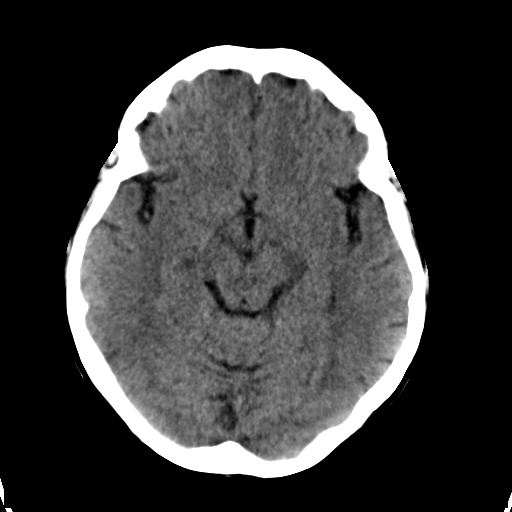
[im 18/27  brain]
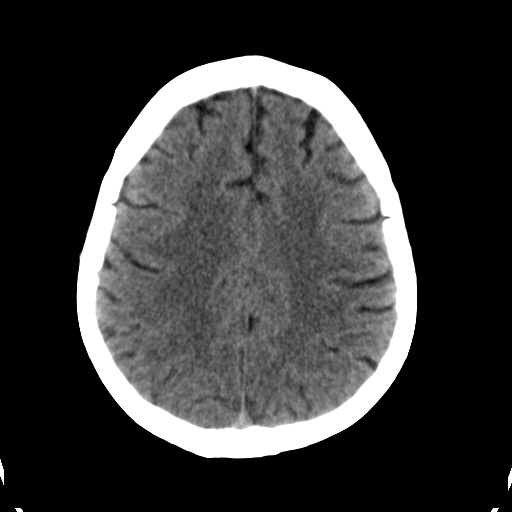

[Series 5: bone recon · axial · 0.42mm/px · z∈[-35,+44]mm · 6 of 74 slices shown]
[im 9/74  bone]
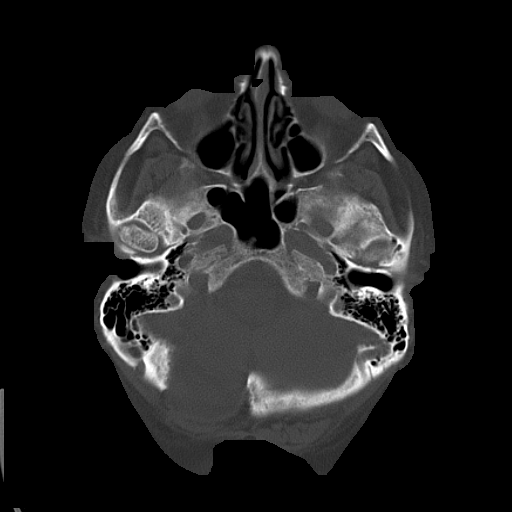
[im 17/74  bone]
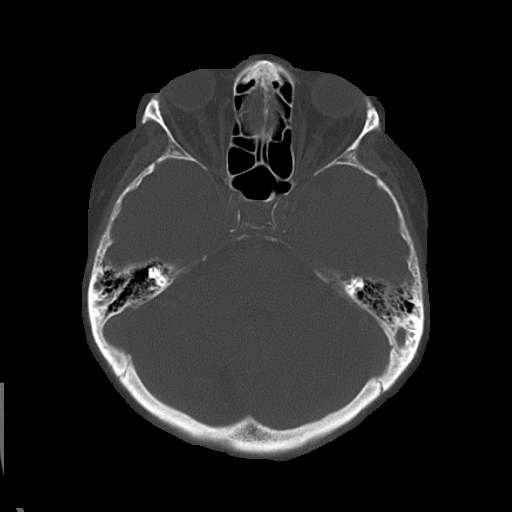
[im 25/74  bone]
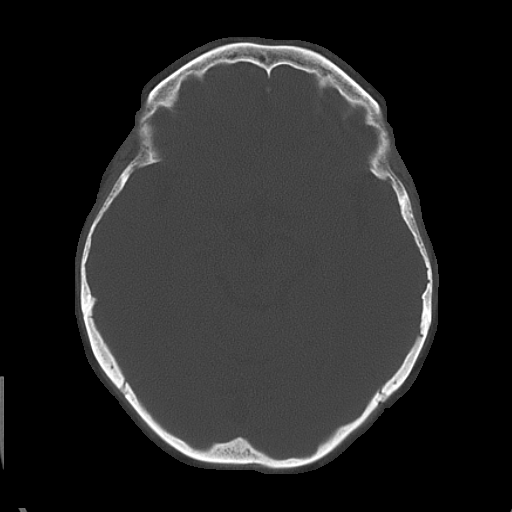
[im 33/74  bone]
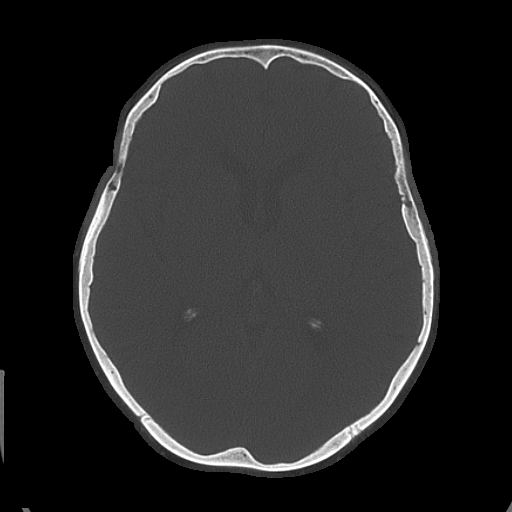
[im 41/74  bone]
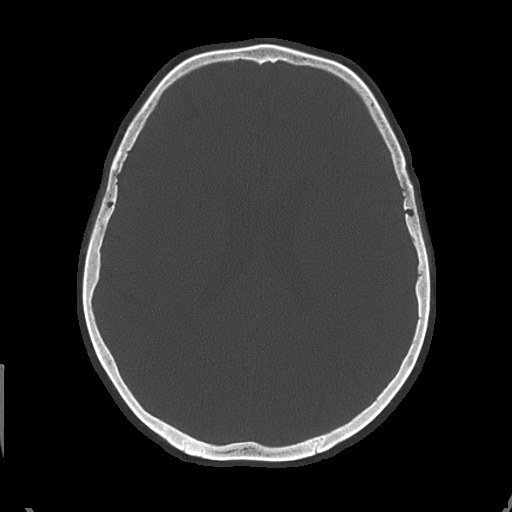
[im 49/74  bone]
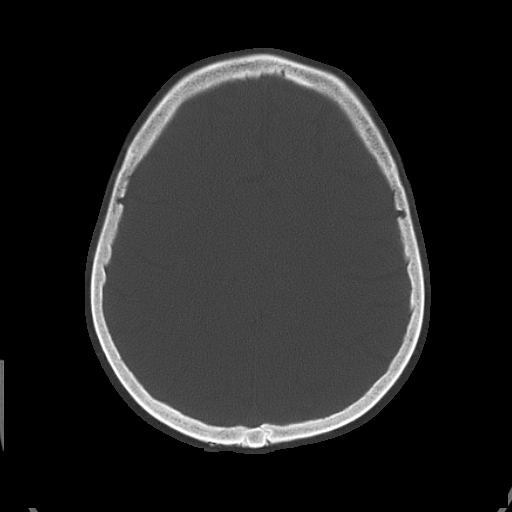

[18 of 30 positions shown; findings below may reference images not displayed]

FINDINGS: There is no evidence of acute infarction, mass lesion, or intra- or
extra-axial hemorrhage on CT.

Scattered periventricular and subcortical white matter change likely
reflects small vessel ischemic microangiopathy.

The posterior fossa, including the cerebellum, brainstem and fourth
ventricle, is within normal limits. The third and lateral
ventricles, and basal ganglia are unremarkable in appearance. The
cerebral hemispheres are symmetric in appearance, with normal
gray-white differentiation. No mass effect or midline shift is seen.

There is no evidence of fracture; a right occipital craniectomy
defect is noted, with underlying postoperative change at the right
cerebellar hemisphere. The orbits are within normal limits. The
paranasal sinuses and mastoid air cells are well-aerated. No
significant soft tissue abnormalities are seen.
IMPRESSION: 1. No evidence of traumatic intracranial injury or fracture.
2. Scattered small vessel ischemic microangiopathy. Postoperative
change at the right cerebellar hemisphere.

## 2016-09-07 DIAGNOSIS — M0579 Rheumatoid arthritis with rheumatoid factor of multiple sites without organ or systems involvement: Secondary | ICD-10-CM | POA: Diagnosis not present

## 2016-09-07 DIAGNOSIS — M25512 Pain in left shoulder: Secondary | ICD-10-CM | POA: Diagnosis not present

## 2016-09-07 DIAGNOSIS — G5602 Carpal tunnel syndrome, left upper limb: Secondary | ICD-10-CM | POA: Diagnosis not present

## 2016-09-07 DIAGNOSIS — G8929 Other chronic pain: Secondary | ICD-10-CM | POA: Diagnosis not present

## 2016-09-07 DIAGNOSIS — J208 Acute bronchitis due to other specified organisms: Secondary | ICD-10-CM | POA: Diagnosis not present

## 2016-09-11 ENCOUNTER — Encounter: Payer: Self-pay | Admitting: Psychiatry

## 2016-09-11 ENCOUNTER — Ambulatory Visit (INDEPENDENT_AMBULATORY_CARE_PROVIDER_SITE_OTHER): Payer: 59 | Admitting: Psychiatry

## 2016-09-11 VITALS — BP 131/80 | HR 98 | Temp 97.8°F | Wt 139.6 lb

## 2016-09-11 DIAGNOSIS — F411 Generalized anxiety disorder: Secondary | ICD-10-CM | POA: Diagnosis not present

## 2016-09-11 DIAGNOSIS — F3160 Bipolar disorder, current episode mixed, unspecified: Secondary | ICD-10-CM | POA: Diagnosis not present

## 2016-09-11 MED ORDER — ALPRAZOLAM 0.25 MG PO TABS: 0.2500 mg | ORAL_TABLET | Freq: Every evening | ORAL | 2 refills | Status: DC | PRN

## 2016-09-11 MED ORDER — ESCITALOPRAM OXALATE 10 MG PO TABS: 10.0000 mg | ORAL_TABLET | Freq: Every morning | ORAL | 1 refills | Status: DC

## 2016-09-11 NOTE — Progress Notes (Signed)
[place] MD/PA/NP OP Progress Note  09/11/2016 11:30 AM Meghan [name]  MRN:  [id number]  Subjective:   Meghan [name]  is a 71year-old female who presented for the follow-up appointment . She reported that she has been doing well. She reported that she was sick for almost 3 weeks after she received the injections for pneumonia and influenza. She reported that she has never been sick for such a long period of time. She is feeling better now. She currently lives by herself. She is spending time with her great granddaughter. She was calm and alert during the interview. She denied having any suicidal ideations or plans. She takes Xanax on a when necessary basis. She reported that the medications are helping her. She does not have any acute symptoms at this time. She is taking care of herself and has been planning to spend the day with her family members. She denied having any suicidal homicidal ideations or plans.      Chief Complaint:  Chief Complaint    Follow-up; Medication Refill     Visit Diagnosis:     ICD-9-CM ICD-10-CM   1. Bipolar I disorder, most recent episode mixed ([place]) 296.60 F31.60   2. Anxiety state 300.00 F41.1     Past Medical History:  Past Medical History:  Diagnosis Date  . Anginal pain ([place])   . Anxiety   . Arthritis    RA  . Asthma   . Brain tumor (benign) ([place])   . COPD (chronic obstructive pulmonary disease) ([place])   . Coronary artery disease   . DDD (degenerative disc disease)   . Depression   . Diabetes mellitus   . GERD (gastroesophageal reflux disease)   . Headache   . Heart murmur   . Heart murmur   . Hypercholesteremia   . Hypertension   . Lumbar degenerative disc disease   . Migraines   . Obese   . Obesity   . Osteopenia   . Osteoporosis   . Pneumonia   . PTSD (post-traumatic stress disorder)     Past Surgical History:  Procedure Laterality Date  . ABDOMINAL HYSTERECTOMY    . CERVICAL FUSION    . CHOLECYSTECTOMY    . COLONOSCOPY WITH PROPOFOL N/A  09/20/2015   Procedure: COLONOSCOPY WITH PROPOFOL;  Surgeon: Josefine Class, MD;  Location: [facility] ENDOSCOPY;  Service: Endoscopy;  Laterality: N/A;  . COLONOSCOPY WITH PROPOFOL N/A 01/27/2016   Procedure: COLONOSCOPY WITH PROPOFOL;  Surgeon: Manya Silvas, MD;  Location[facility] ENDOSCOPY;  Service: Endoscopy;  Laterality: N/A;  . FINGER ARTHROPLASTY  02/23/2012   Procedure: FINGER ARTHROPLASTY;  Surgeon: Cammie Sickle., MD;  Location: [place];  Service: Orthopedics;  Laterality: Left;  Extensor carpi radialis longus to Extensor carpi ulnaris transfer, left Metaphalangeal reconstructions of index and long fingers,  . FOOT ARTHROPLASTY     toes x2 rt foot  . HAND RECONSTRUCTION  2011   right-multiple finger joint reconst  . JOINT REPLACEMENT     bilat knee replacements   Family History:  Family History  Problem Relation Age of Onset  . Depression Sister   . Hypertension Mother   . Arthritis/Rheumatoid Mother   . Heart attack Father   . Hypertension Father   . CAD    . Hypertension    . Diabetes Mellitus II    . Arthritis     Social History:  Social History   Social History  . Marital status: Divorced    Spouse  name: N/A  . Number of children: N/A  . Years of education: N/A   Social History Main Topics  . Smoking status: Never Smoker  . Smokeless tobacco: Never Used  . Alcohol use No  . Drug use: No  . Sexual activity: No   Other Topics Concern  . None   Social History Narrative  . None   Additional History:  Patient currently lives with her family members   Assessment:   Musculoskeletal: Strength & Muscle Tone: within normal limits Gait & Station: normal Patient leans: N/A  Psychiatric Specialty Exam: Medication Refill  Pertinent negatives include no chills, nausea or rash.  Anxiety  Symptoms include dizziness and nervous/anxious behavior. Patient reports no nausea or palpitations.    Depression         Past medical history  includes anxiety.     Review of Systems  Constitutional: Negative for chills.  HENT: Negative for hearing loss.   Eyes: Negative for photophobia.  Respiratory: Negative for sputum production.   Cardiovascular: Negative for palpitations.  Gastrointestinal: Negative for nausea.  Genitourinary: Negative for frequency.  Musculoskeletal: Positive for back pain.  Skin: Negative for rash.  Neurological: Positive for dizziness. Negative for tingling.  Endo/Heme/Allergies: Negative for environmental allergies.  Psychiatric/Behavioral: Positive for depression. Negative for hallucinations. The patient is nervous/anxious.   All other systems reviewed and are negative.   Blood pressure 131/80, pulse 98, temperature 97.8 F (36.6 C), temperature source Oral, weight 139 lb 9.6 oz (63.3 kg).Body mass index is 30.21 kg/m.  General Appearance: Casual  Eye Contact:  Fair  Speech:  Clear and Coherent  Volume:  Normal  Mood:  Anxious  Affect:  Congruent  Thought Process:  Goal Directed  Orientation:  Full (Time, Place, and Person)  Thought Content:  Negative and NA  Suicidal Thoughts:  No  Homicidal Thoughts:  No  Memory:  Immediate;   Fair  Judgement:  Fair  Insight:  Fair  Psychomotor Activity:  Normal  Concentration:  Fair  Recall:  [employer] of Knowledge: Fair  Language: Fair  Akathisia:  No  Handed:  Right  AIMS (if indicated):  none  Assets:  Communication Skills Desire for Improvement Physical Health  ADL's:  Intact  Cognition: WNL  Sleep:  6-7    Is the patient at risk to self?  No. Has the patient been a risk to self in the past 6 months?  No. Has the patient been a risk to self within the distant past?  No. Is the patient a risk to others?  No. Has the patient been a risk to others in the past 6 months?  No. Has the patient been a risk to others within the distant past?  No.  Current Medications: Current Outpatient Prescriptions  Medication Sig Dispense Refill  .  acyclovir ointment (ZOVIRAX) 5 % Apply topically every 3 (three) hours. Use as needed for blistering. 5 g 0  . albuterol (PROVENTIL HFA;VENTOLIN HFA) 108 (90 BASE) MCG/ACT inhaler Inhale 2 puffs into the lungs every 6 (six) hours as needed for wheezing or shortness of breath.     Marland Kitchen alendronate (FOSAMAX) 70 MG tablet Take 70 mg by mouth once a week. Pt takes on Monday.    . ALPRAZolam (XANAX) 0.25 MG tablet Take 1 tablet (0.25 mg total) by mouth at bedtime as needed for anxiety. 15 tablet 2  . budesonide-formoterol (SYMBICORT) 80-4.5 MCG/ACT inhaler Inhale 2 puffs into the lungs 2 (two) times daily.    Marland Kitchen  Cholecalciferol (VITAMIN D) 2000 UNITS CAPS Take 1 capsule by mouth daily.    . ciclopirox (PENLAC) 8 % solution     . cloNIDine (CATAPRES) 0.1 MG tablet Take 0.1 mg by mouth 2 (two) times daily.    Marland Kitchen escitalopram (LEXAPRO) 10 MG tablet Take 1 tablet (10 mg total) by mouth every morning. 90 tablet 1  . esomeprazole (NEXIUM) 40 MG capsule Take 40 mg by mouth at bedtime.    . insulin starter kit- pen needles MISC 1 kit by Other route once. 1 kit 0  . isosorbide mononitrate (IMDUR) 30 MG 24 hr tablet Take 30 mg by mouth daily.    Marland Kitchen ketoconazole (NIZORAL) 2 % cream Apply 1 application topically at bedtime.    . lamoTRIgine (LAMICTAL) 100 MG tablet Take 1 tablet (100 mg total) by mouth daily. 90 tablet 3  . levocetirizine (XYZAL) 5 MG tablet Take 5 mg by mouth daily.     . meloxicam (MOBIC) 7.5 MG tablet     . nystatin (MYCOSTATIN) 100000 UNIT/ML suspension     . ONE TOUCH ULTRA TEST test strip     . potassium chloride SA (K-DUR,KLOR-CON) 20 MEQ tablet     . predniSONE (DELTASONE) 5 MG tablet Take 5 mg by mouth at bedtime.     . pregabalin (LYRICA) 50 MG capsule Take 50 mg by mouth 2 (two) times daily.    . pregabalin (LYRICA) 50 MG capsule TAKE ONE CAPSULE BY MOUTH TWICE A DAY    . prochlorperazine (COMPAZINE) 5 MG tablet Take 5 mg by mouth every 6 (six) hours as needed for nausea or vomiting.    .  saxagliptin HCl (ONGLYZA) 5 MG TABS tablet Take 5 mg by mouth at bedtime.    . Syringe, Disposable, 1 ML MISC 300 Syringes by Does not apply route daily. 300 each 0  . Tofacitinib Citrate 5 MG TABS Take 1 tablet by mouth 2 (two) times daily.      No current facility-administered medications for this visit.     Medical Decision Making:  Established Problem, Stable/Improving (1), Review and summation of old records (2) and Review of Last Therapy Session (1)  Treatment Plan Summary:Medication management    Discussed with patient about the medications  She  will continue on Lexapro 10 mg in the morning and lamotrigine 100 mg at bedtime. Continue Xanax on a when necessary basis.  Follow-up in 3  months or earlier depending on her symptoms.     More than 50% of the time spent in psychoeducation, counseling and coordination of care.    This note was generated in part or whole with voice recognition software. Voice regonition is usually quite accurate but there are transcription errors that can and very often do occur. I apologize for any typographical errors that were not detected and corrected.   Rainey Pines, MD  09/11/2016, 11:30 AM

## 2016-09-29 DIAGNOSIS — M5416 Radiculopathy, lumbar region: Secondary | ICD-10-CM | POA: Diagnosis not present

## 2016-09-29 DIAGNOSIS — M48062 Spinal stenosis, lumbar region with neurogenic claudication: Secondary | ICD-10-CM | POA: Diagnosis not present

## 2016-09-29 DIAGNOSIS — M5136 Other intervertebral disc degeneration, lumbar region: Secondary | ICD-10-CM | POA: Diagnosis not present

## 2016-10-15 DIAGNOSIS — J449 Chronic obstructive pulmonary disease, unspecified: Secondary | ICD-10-CM | POA: Diagnosis not present

## 2016-10-15 DIAGNOSIS — D519 Vitamin B12 deficiency anemia, unspecified: Secondary | ICD-10-CM | POA: Diagnosis not present

## 2016-10-15 DIAGNOSIS — J0191 Acute recurrent sinusitis, unspecified: Secondary | ICD-10-CM | POA: Diagnosis not present

## 2016-10-15 DIAGNOSIS — J309 Allergic rhinitis, unspecified: Secondary | ICD-10-CM | POA: Diagnosis not present

## 2016-10-15 DIAGNOSIS — I10 Essential (primary) hypertension: Secondary | ICD-10-CM | POA: Diagnosis not present

## 2016-10-15 DIAGNOSIS — E1140 Type 2 diabetes mellitus with diabetic neuropathy, unspecified: Secondary | ICD-10-CM | POA: Diagnosis not present

## 2016-11-11 IMAGING — CR DG CHEST 2V
1 series · 2 of 2 positions shown · non-contrast
Comparison: December 24, 2014

CLINICAL DATA: Chest pain and difficulty breathing ; wheezing

EXAM:
CHEST  2 VIEW

[Series 1: dg chest 2 view · 0.14mm/px · 2 of 2 slices shown]
[im 1/2]
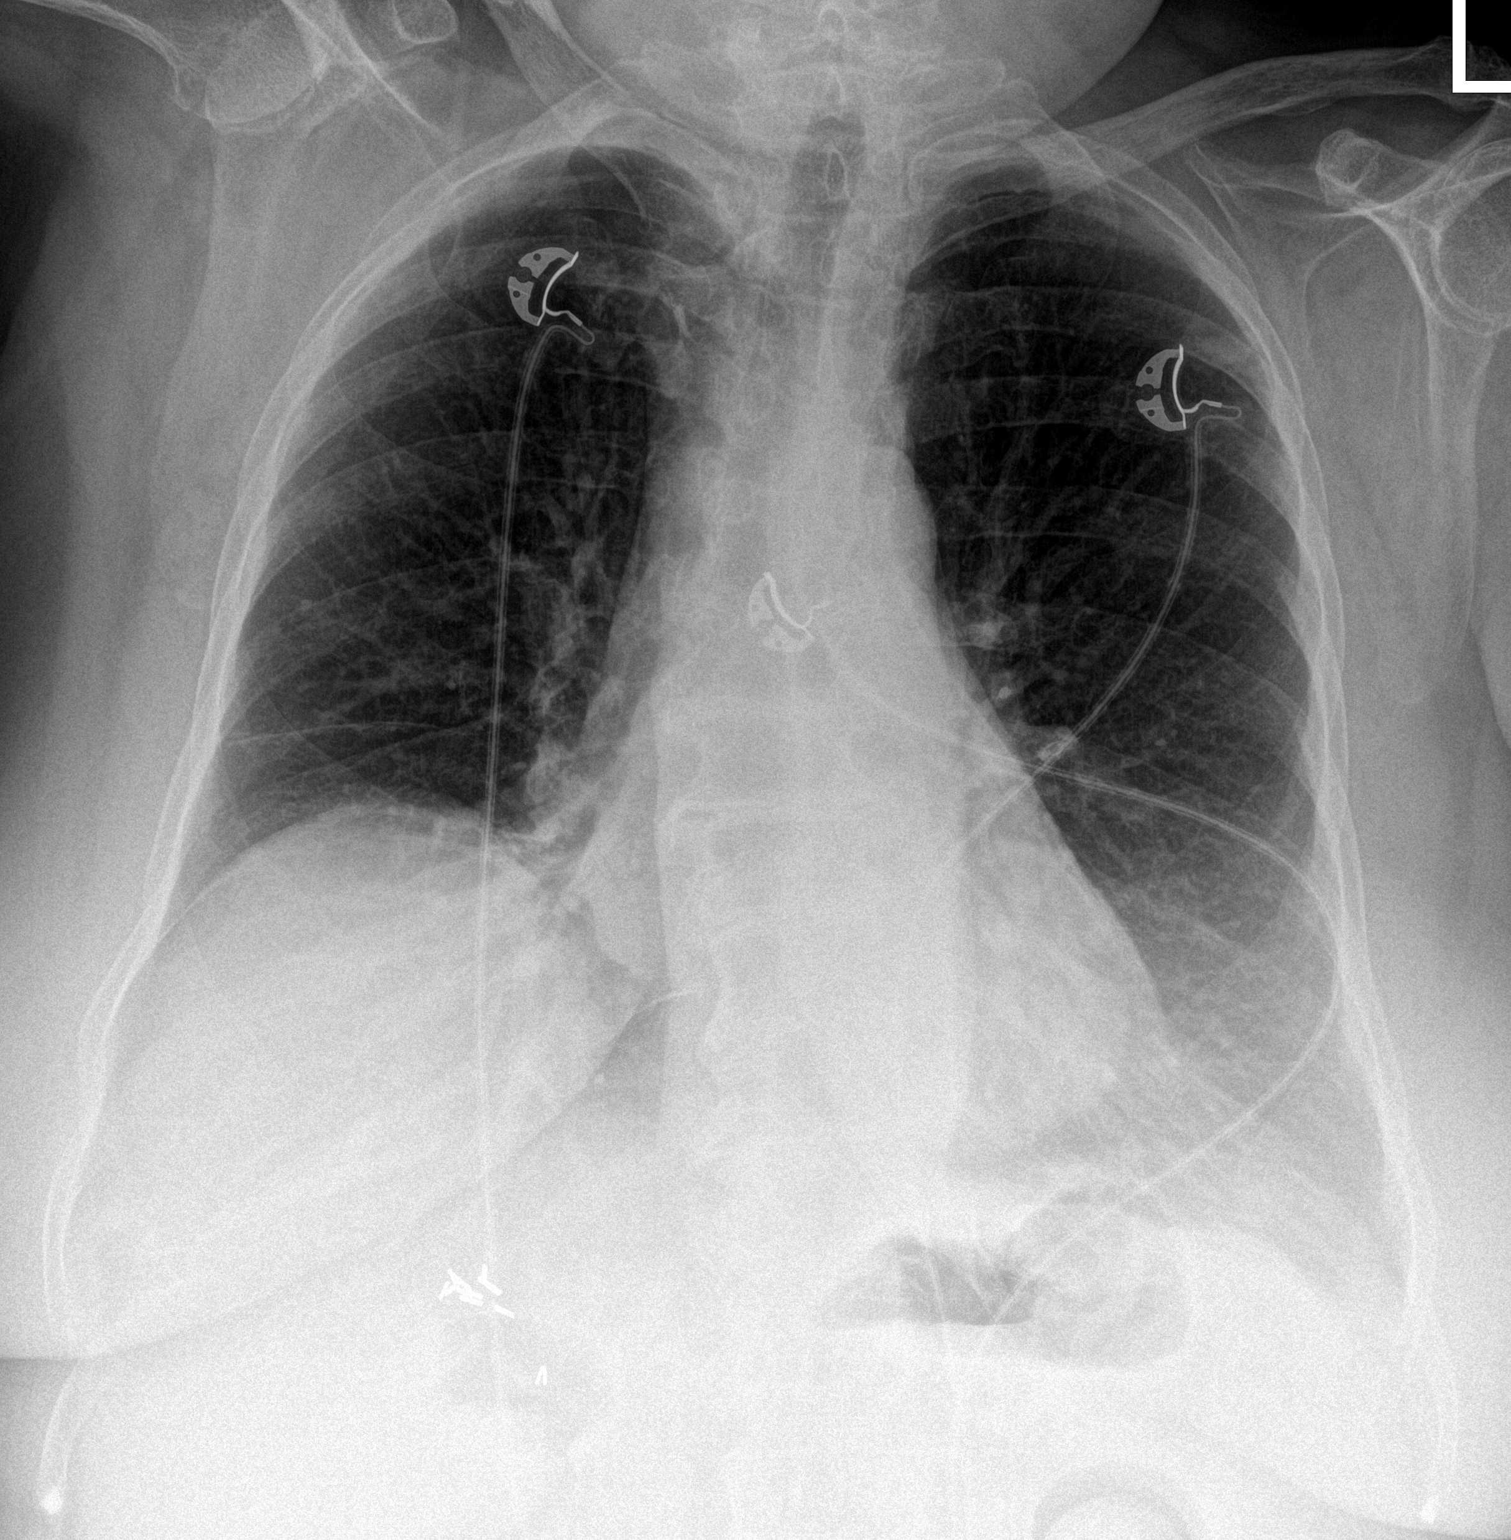
[im 2/2]
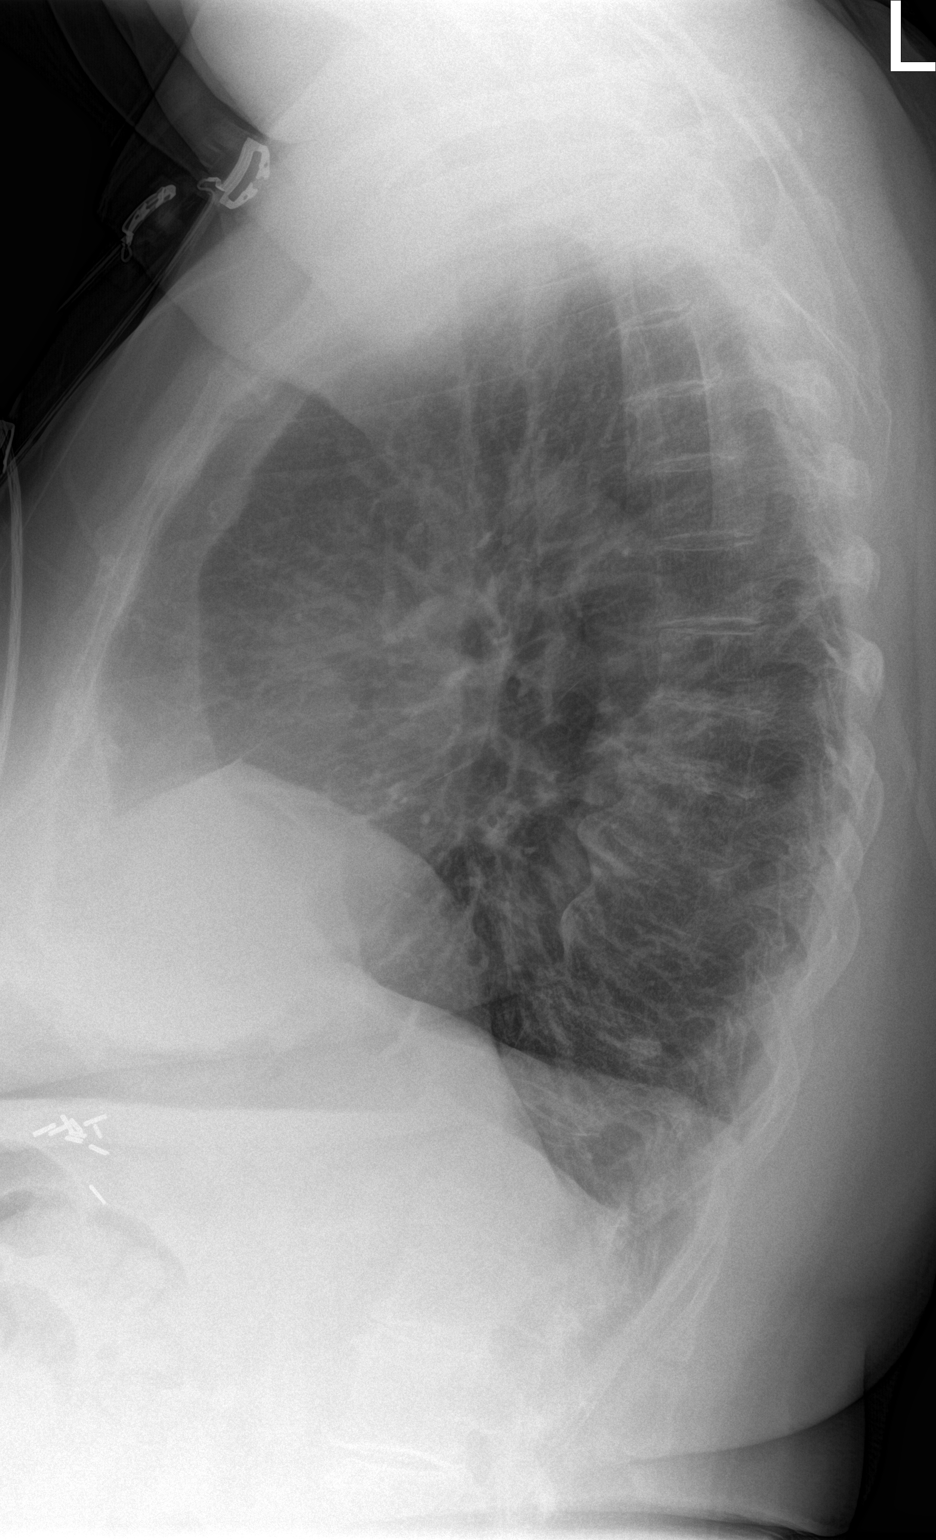

[2 of 2 positions shown; findings below may reference images not displayed]

FINDINGS: There is stable elevation of the right hemidiaphragm. There is no
edema or consolidation. The heart size and pulmonary vascularity are
within normal limits. No adenopathy. There is degenerative change in
the thoracic spine.
IMPRESSION: No edema or consolidation. Stable elevation of the right
hemidiaphragm.

## 2016-11-12 DIAGNOSIS — D519 Vitamin B12 deficiency anemia, unspecified: Secondary | ICD-10-CM | POA: Diagnosis not present

## 2016-11-12 DIAGNOSIS — J309 Allergic rhinitis, unspecified: Secondary | ICD-10-CM | POA: Diagnosis not present

## 2016-11-12 DIAGNOSIS — H60513 Acute actinic otitis externa, bilateral: Secondary | ICD-10-CM | POA: Diagnosis not present

## 2016-11-13 IMAGING — CR DG CHEST 1V
1 series · 1 of 1 positions shown · non-contrast
Comparison: 03/01/2015

CLINICAL DATA: Fever

EXAM:
CHEST  1 VIEW

[ap]
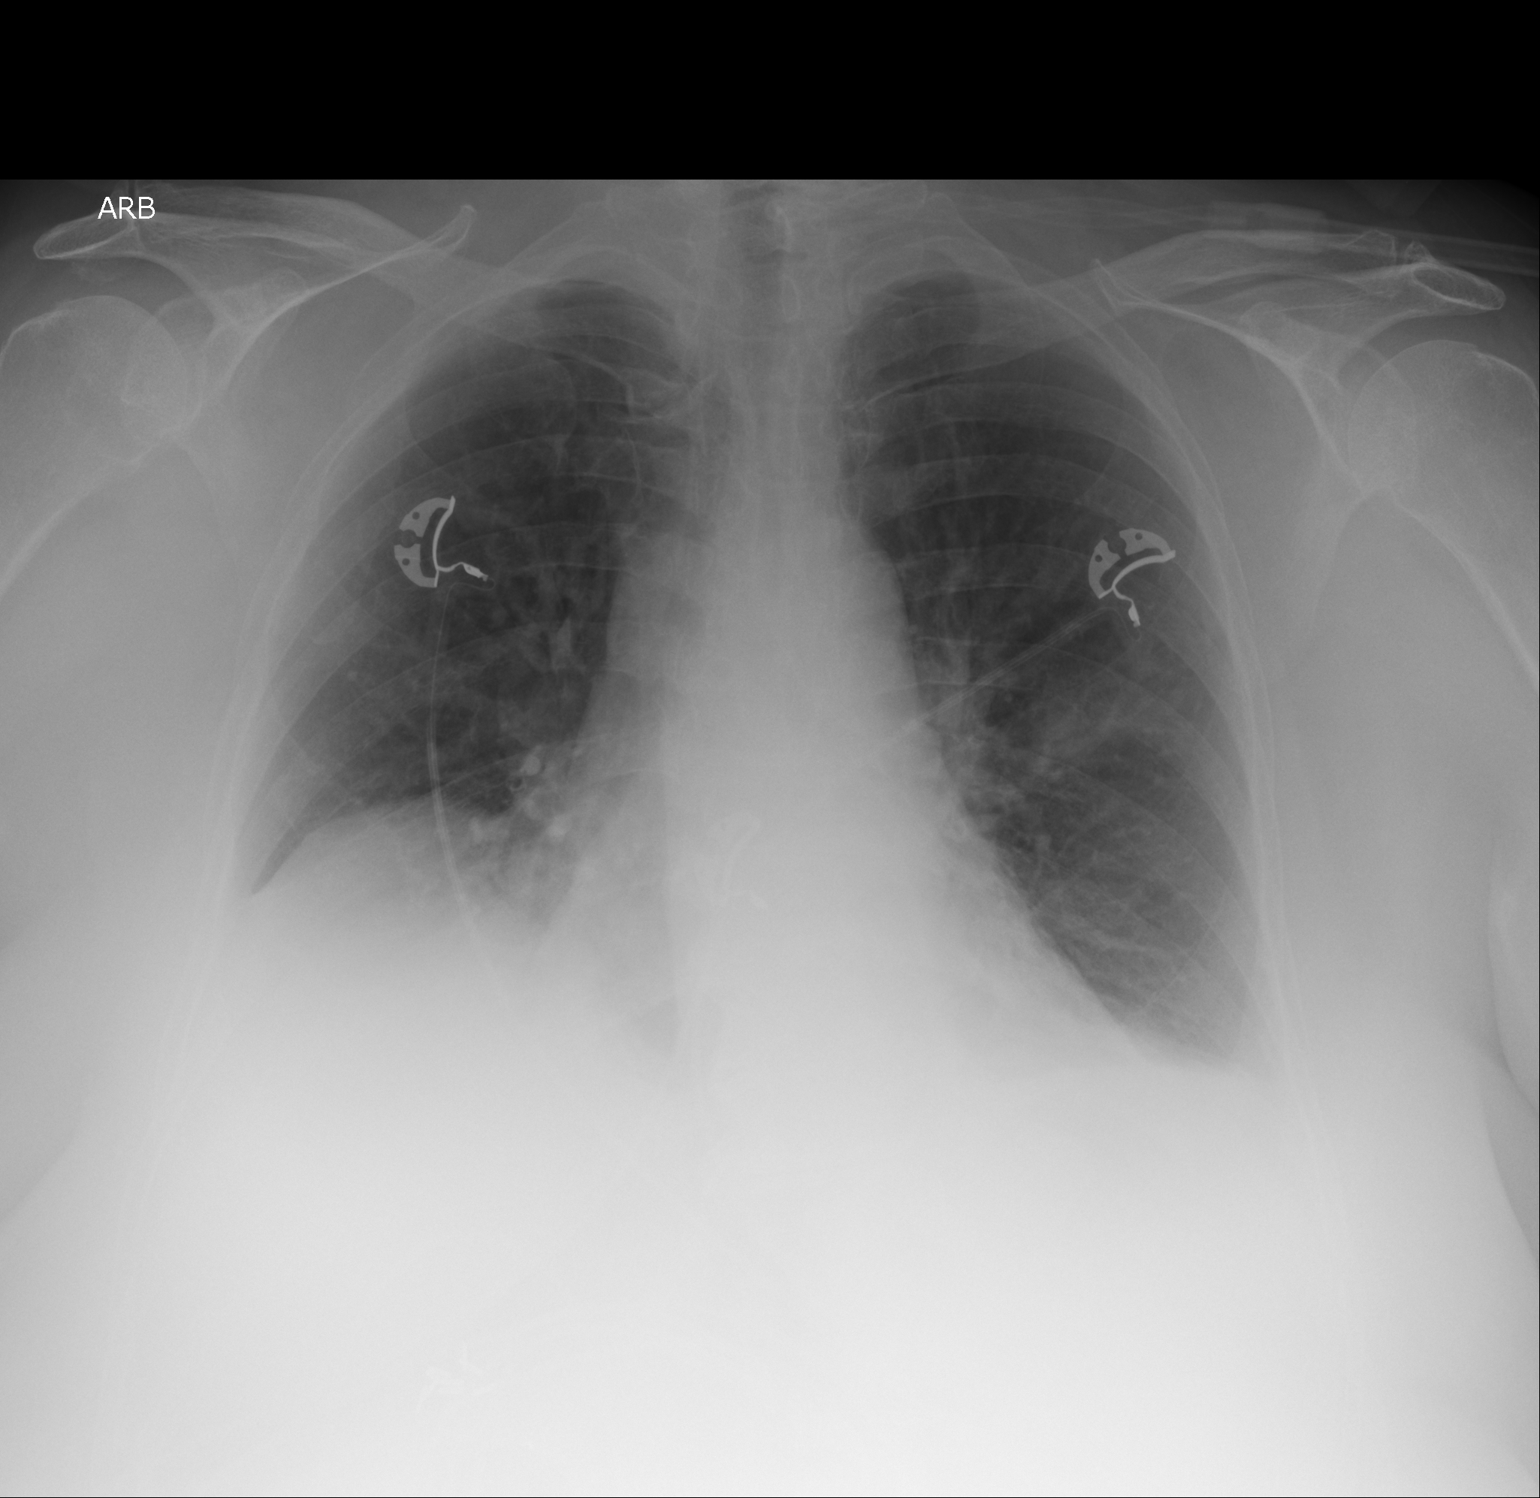

[1 of 1 positions shown; findings below may reference images not displayed]

FINDINGS: The heart size and mediastinal contours are within normal limits.
Both lungs are clear. The visualized skeletal structures are
unremarkable. Mild right hemidiaphragmatic elevation reidentified.
Indistinctness of the lung bases is likely due to crowding of the
bronchovascular markings related to hypoaeration.
IMPRESSION: No active disease. If symptoms persist, consider PA and lateral
chest radiographs obtained at full inspiration when the patient is
clinically able.

## 2016-11-13 IMAGING — CT CT HEAD W/O CM
1 series · 16 of 30 positions shown, 20 images · non-contrast
Comparison: 12/24/2014

CLINICAL DATA: Headache for 2 days.

EXAM:
CT HEAD WITHOUT CONTRAST
TECHNIQUE: Contiguous axial images were obtained from the base of the skull
through the vertex without intravenous contrast.

[Series 2: head wo · axial · 0.46mm/px · z∈[-141,-8]mm · 16 of 30 slices shown, 20 images]
[im 2/30  brain]
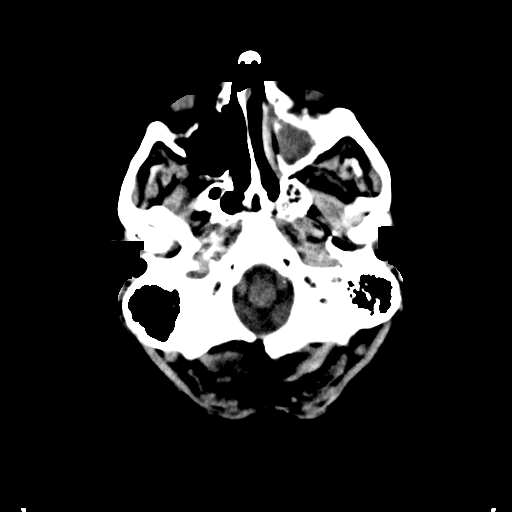
[im 2/30  bone]
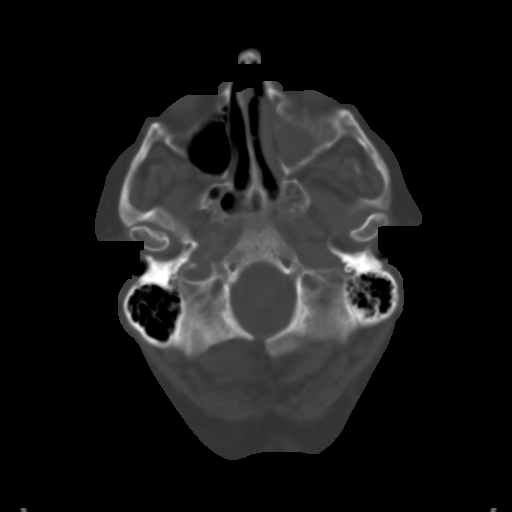
[im 4/30  brain]
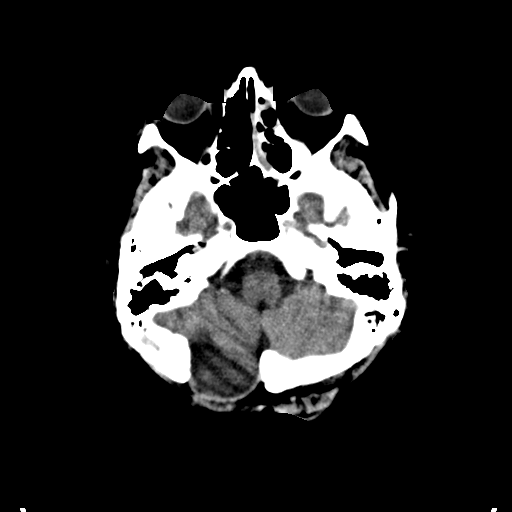
[im 6/30  brain]
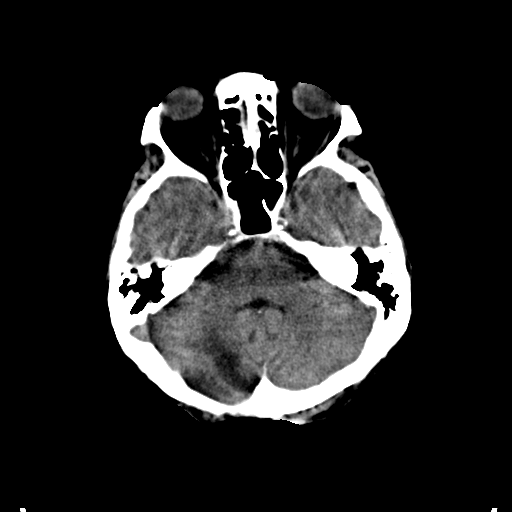
[im 8/30  brain]
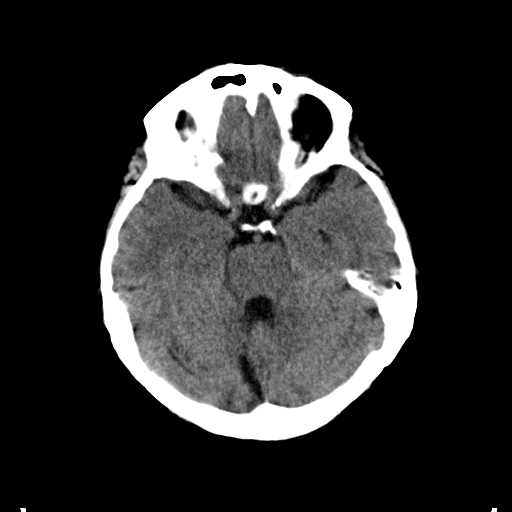
[im 9/30  brain]
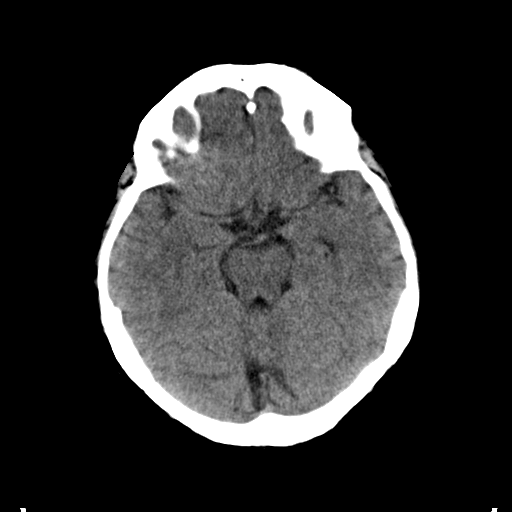
[im 9/30  bone]
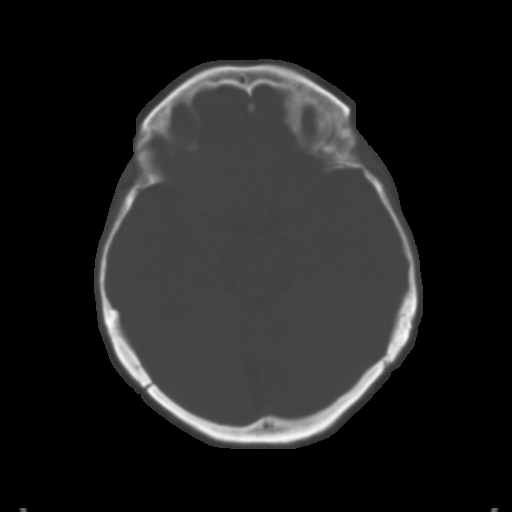
[im 11/30  brain]
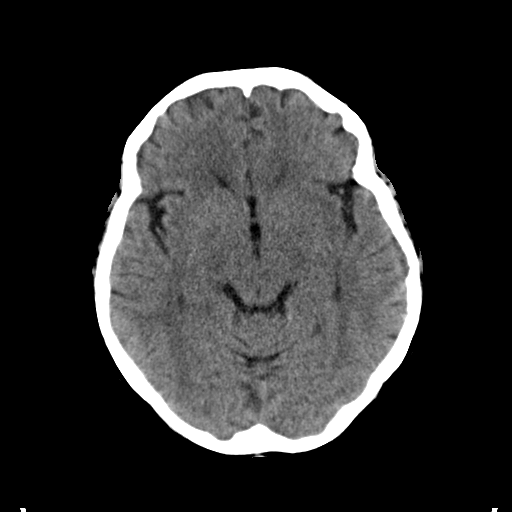
[im 13/30  brain]
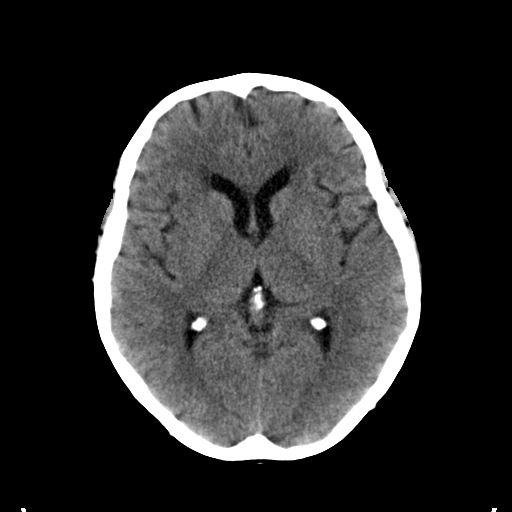
[im 15/30  brain]
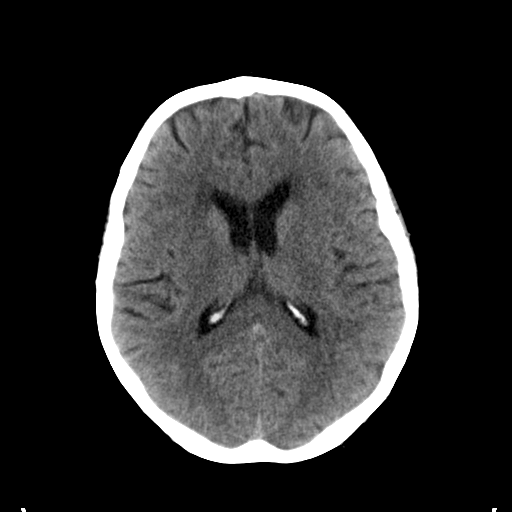
[im 16/30  brain]
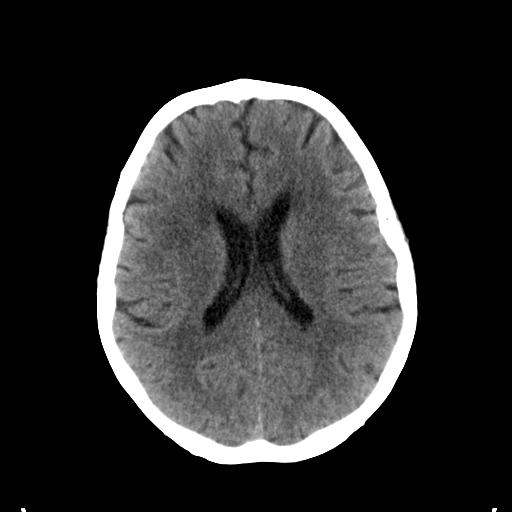
[im 16/30  bone]
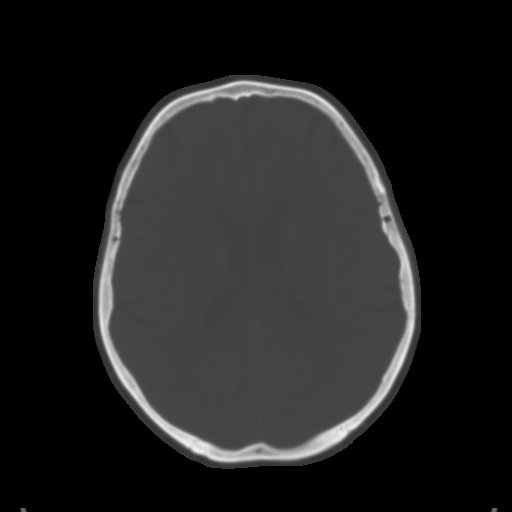
[im 18/30  brain]
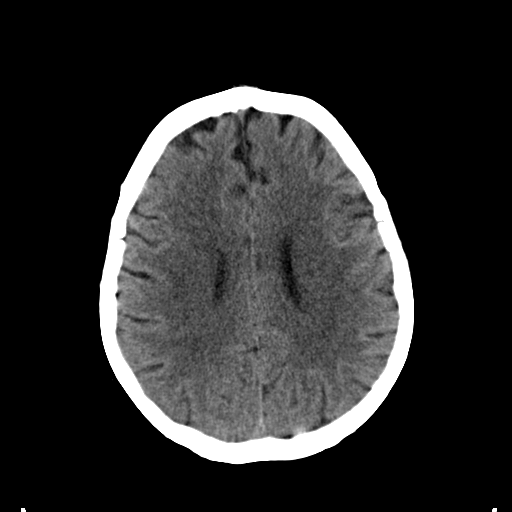
[im 20/30  brain]
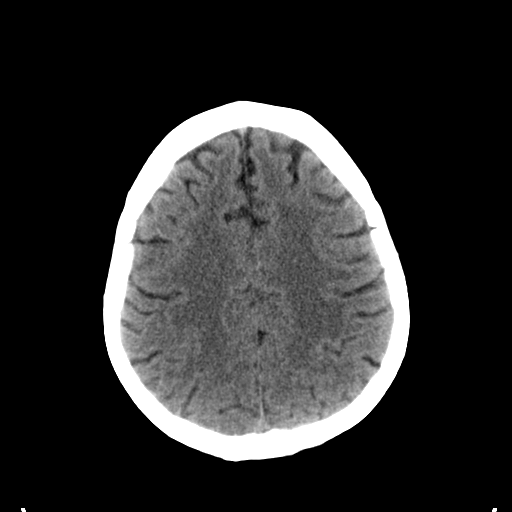
[im 22/30  brain]
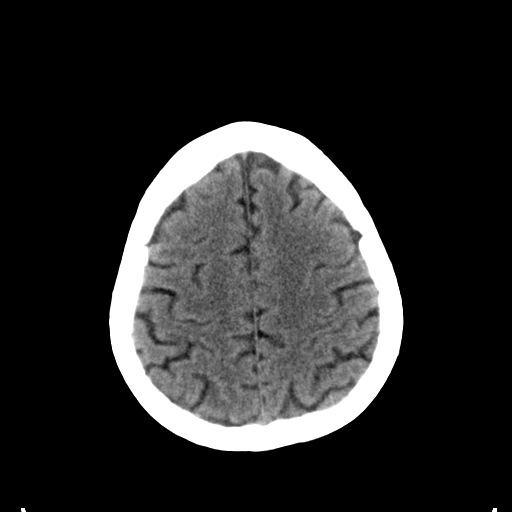
[im 23/30  brain]
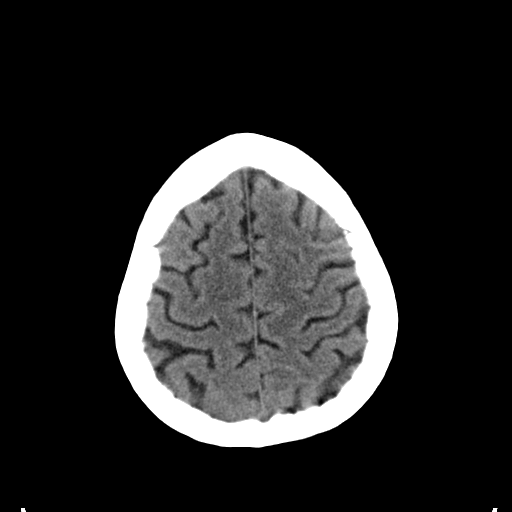
[im 23/30  bone]
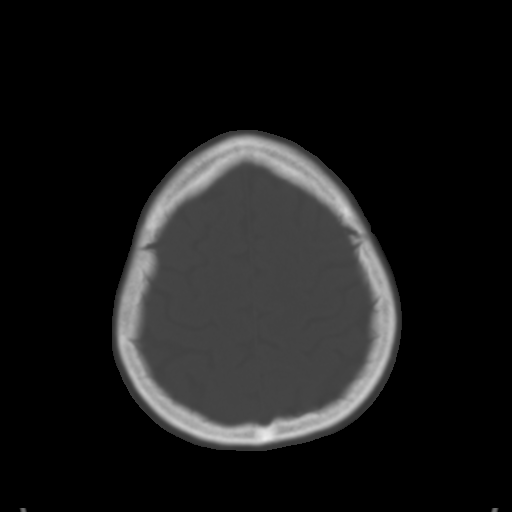
[im 25/30  brain]
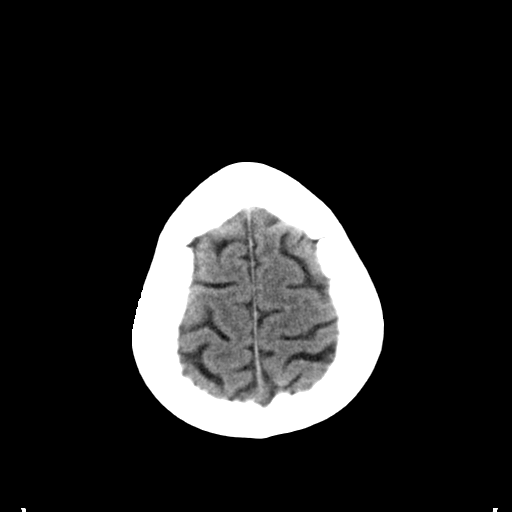
[im 27/30  brain]
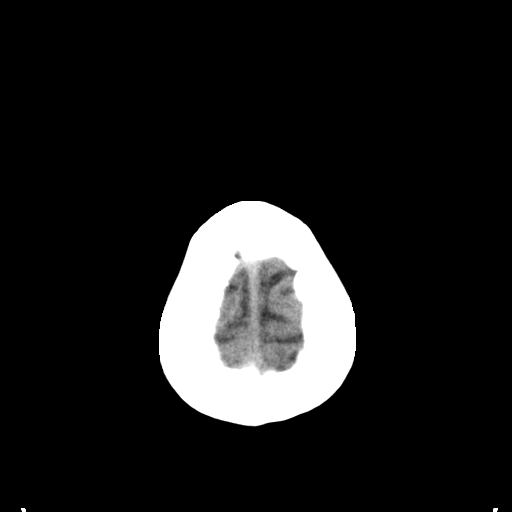
[im 29/30  brain]
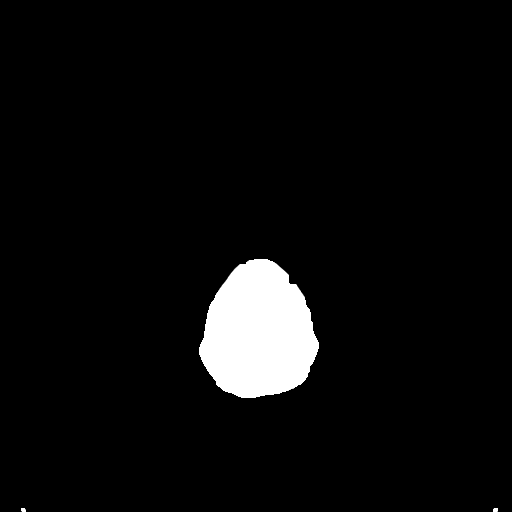

[16 of 30 positions shown; findings below may reference images not displayed]

FINDINGS: There is prior right occipital craniectomy with subjacent
encephalomalacia of the right cerebellar hemisphere.

There is no intracranial hemorrhage, mass or evidence of acute
infarction. There is no extra-axial fluid collection. There is mild
generalized atrophy. There is moderate periventricular hypodensity
consistent with chronic small vessel ischemic disease.

There is near complete opacification of the left maxillary sinus
with an air-fluid level. This is new from 12/24/2014.
IMPRESSION: 1. Chronic encephalomalacia in the right cerebellar hemisphere
associated with a remote right occipital craniectomy.
2. Mild generalized atrophy and chronic small vessel ischemic
changes.
3. Left maxillary sinusitis with an air-fluid level.

## 2016-11-15 IMAGING — CR DG CHEST 1V
1 series · 1 of 1 positions shown · non-contrast
Comparison: 03/03/2015

CLINICAL DATA: Increased shortness of breath.  COPD.

EXAM:
CHEST  1 VIEW

[ap]
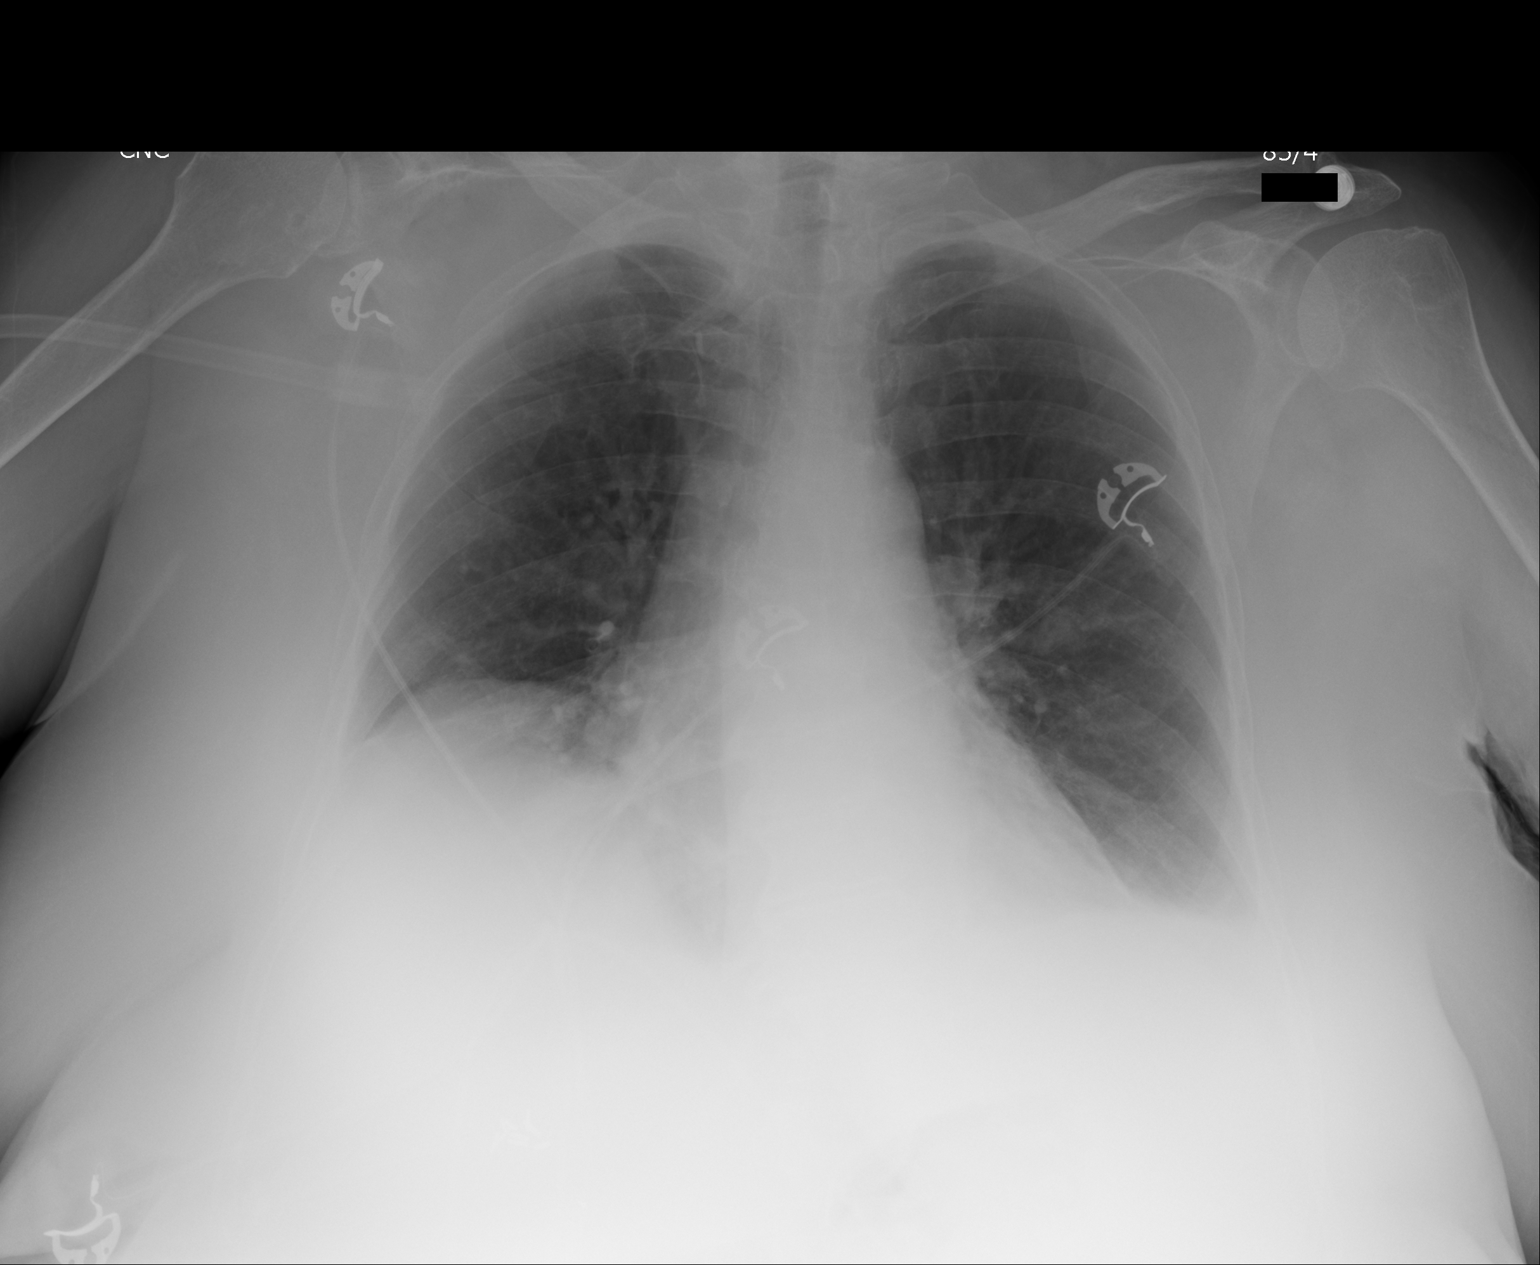

[1 of 1 positions shown; findings below may reference images not displayed]

FINDINGS: Normal heart size. There is asymmetric elevation of the right
hemidiaphragm. No pleural effusion or edema identified. No airspace
consolidation. The visualized bony structures appear intact.
IMPRESSION: 1. Asymmetric elevation of right hemidiaphragm.
2. No acute findings.

## 2016-12-09 ENCOUNTER — Ambulatory Visit: Payer: 59 | Admitting: Psychiatry

## 2017-01-19 DIAGNOSIS — J449 Chronic obstructive pulmonary disease, unspecified: Secondary | ICD-10-CM | POA: Diagnosis not present

## 2017-01-19 DIAGNOSIS — I10 Essential (primary) hypertension: Secondary | ICD-10-CM | POA: Diagnosis not present

## 2017-01-19 DIAGNOSIS — E1140 Type 2 diabetes mellitus with diabetic neuropathy, unspecified: Secondary | ICD-10-CM | POA: Diagnosis not present

## 2017-01-19 DIAGNOSIS — J019 Acute sinusitis, unspecified: Secondary | ICD-10-CM | POA: Diagnosis not present

## 2017-01-19 DIAGNOSIS — D519 Vitamin B12 deficiency anemia, unspecified: Secondary | ICD-10-CM | POA: Diagnosis not present

## 2017-02-22 ENCOUNTER — Emergency Department: Payer: Medicare Other

## 2017-02-22 ENCOUNTER — Encounter: Payer: Self-pay | Admitting: Emergency Medicine

## 2017-02-22 ENCOUNTER — Inpatient Hospital Stay
Admission: EM | Admit: 2017-02-22 | Discharge: 2017-02-26 | DRG: 371 | Disposition: A | Discharge status: Home Health | Payer: Medicare Other | Attending: Specialist | Admitting: Specialist

## 2017-02-22 DIAGNOSIS — Z96653 Presence of artificial knee joint, bilateral: Secondary | ICD-10-CM | POA: Diagnosis not present

## 2017-02-22 DIAGNOSIS — R0602 Shortness of breath: Secondary | ICD-10-CM

## 2017-02-22 DIAGNOSIS — E119 Type 2 diabetes mellitus without complications: Secondary | ICD-10-CM | POA: Diagnosis not present

## 2017-02-22 DIAGNOSIS — G43719 Chronic migraine without aura, intractable, without status migrainosus: Secondary | ICD-10-CM | POA: Diagnosis not present

## 2017-02-22 DIAGNOSIS — I479 Paroxysmal tachycardia, unspecified: Secondary | ICD-10-CM | POA: Diagnosis not present

## 2017-02-22 DIAGNOSIS — I110 Hypertensive heart disease with heart failure: Secondary | ICD-10-CM | POA: Diagnosis present

## 2017-02-22 DIAGNOSIS — F419 Anxiety disorder, unspecified: Secondary | ICD-10-CM | POA: Diagnosis present

## 2017-02-22 DIAGNOSIS — E871 Hypo-osmolality and hyponatremia: Secondary | ICD-10-CM | POA: Diagnosis not present

## 2017-02-22 DIAGNOSIS — Z79899 Other long term (current) drug therapy: Secondary | ICD-10-CM | POA: Diagnosis not present

## 2017-02-22 DIAGNOSIS — F4310 Post-traumatic stress disorder, unspecified: Secondary | ICD-10-CM | POA: Diagnosis present

## 2017-02-22 DIAGNOSIS — A0839 Other viral enteritis: Secondary | ICD-10-CM | POA: Diagnosis not present

## 2017-02-22 DIAGNOSIS — Z741 Need for assistance with personal care: Secondary | ICD-10-CM | POA: Diagnosis not present

## 2017-02-22 DIAGNOSIS — I2510 Atherosclerotic heart disease of native coronary artery without angina pectoris: Secondary | ICD-10-CM | POA: Diagnosis not present

## 2017-02-22 DIAGNOSIS — Z885 Allergy status to narcotic agent status: Secondary | ICD-10-CM

## 2017-02-22 DIAGNOSIS — Z7951 Long term (current) use of inhaled steroids: Secondary | ICD-10-CM

## 2017-02-22 DIAGNOSIS — Z9181 History of falling: Secondary | ICD-10-CM | POA: Diagnosis not present

## 2017-02-22 DIAGNOSIS — K219 Gastro-esophageal reflux disease without esophagitis: Secondary | ICD-10-CM | POA: Diagnosis present

## 2017-02-22 DIAGNOSIS — K6389 Other specified diseases of intestine: Secondary | ICD-10-CM | POA: Diagnosis not present

## 2017-02-22 DIAGNOSIS — E7800 Pure hypercholesterolemia, unspecified: Secondary | ICD-10-CM | POA: Diagnosis not present

## 2017-02-22 DIAGNOSIS — I5032 Chronic diastolic (congestive) heart failure: Secondary | ICD-10-CM | POA: Diagnosis not present

## 2017-02-22 DIAGNOSIS — R1084 Generalized abdominal pain: Secondary | ICD-10-CM | POA: Diagnosis not present

## 2017-02-22 DIAGNOSIS — E785 Hyperlipidemia, unspecified: Secondary | ICD-10-CM | POA: Diagnosis not present

## 2017-02-22 DIAGNOSIS — I509 Heart failure, unspecified: Secondary | ICD-10-CM | POA: Diagnosis present

## 2017-02-22 DIAGNOSIS — J189 Pneumonia, unspecified organism: Secondary | ICD-10-CM | POA: Diagnosis present

## 2017-02-22 DIAGNOSIS — M810 Age-related osteoporosis without current pathological fracture: Secondary | ICD-10-CM | POA: Diagnosis not present

## 2017-02-22 DIAGNOSIS — K529 Noninfective gastroenteritis and colitis, unspecified: Secondary | ICD-10-CM | POA: Diagnosis not present

## 2017-02-22 DIAGNOSIS — R7989 Other specified abnormal findings of blood chemistry: Secondary | ICD-10-CM | POA: Diagnosis not present

## 2017-02-22 DIAGNOSIS — R110 Nausea: Secondary | ICD-10-CM

## 2017-02-22 DIAGNOSIS — Z888 Allergy status to other drugs, medicaments and biological substances status: Secondary | ICD-10-CM | POA: Diagnosis not present

## 2017-02-22 DIAGNOSIS — B379 Candidiasis, unspecified: Secondary | ICD-10-CM | POA: Diagnosis not present

## 2017-02-22 DIAGNOSIS — A0472 Enterocolitis due to Clostridium difficile, not specified as recurrent: Secondary | ICD-10-CM | POA: Diagnosis not present

## 2017-02-22 DIAGNOSIS — E869 Volume depletion, unspecified: Secondary | ICD-10-CM | POA: Diagnosis not present

## 2017-02-22 DIAGNOSIS — Z743 Need for continuous supervision: Secondary | ICD-10-CM | POA: Diagnosis not present

## 2017-02-22 DIAGNOSIS — Z66 Do not resuscitate: Secondary | ICD-10-CM | POA: Diagnosis not present

## 2017-02-22 DIAGNOSIS — Z886 Allergy status to analgesic agent status: Secondary | ICD-10-CM

## 2017-02-22 DIAGNOSIS — Z8619 Personal history of other infectious and parasitic diseases: Secondary | ICD-10-CM | POA: Diagnosis not present

## 2017-02-22 DIAGNOSIS — I10 Essential (primary) hypertension: Secondary | ICD-10-CM | POA: Diagnosis not present

## 2017-02-22 DIAGNOSIS — Z86011 Personal history of benign neoplasm of the brain: Secondary | ICD-10-CM

## 2017-02-22 DIAGNOSIS — E1140 Type 2 diabetes mellitus with diabetic neuropathy, unspecified: Secondary | ICD-10-CM | POA: Diagnosis not present

## 2017-02-22 DIAGNOSIS — E872 Acidosis: Secondary | ICD-10-CM | POA: Diagnosis not present

## 2017-02-22 DIAGNOSIS — M069 Rheumatoid arthritis, unspecified: Secondary | ICD-10-CM | POA: Diagnosis present

## 2017-02-22 DIAGNOSIS — I209 Angina pectoris, unspecified: Secondary | ICD-10-CM | POA: Diagnosis not present

## 2017-02-22 DIAGNOSIS — R6889 Other general symptoms and signs: Secondary | ICD-10-CM | POA: Diagnosis not present

## 2017-02-22 DIAGNOSIS — J440 Chronic obstructive pulmonary disease with acute lower respiratory infection: Secondary | ICD-10-CM | POA: Diagnosis not present

## 2017-02-22 DIAGNOSIS — R0600 Dyspnea, unspecified: Secondary | ICD-10-CM | POA: Diagnosis not present

## 2017-02-22 DIAGNOSIS — Z794 Long term (current) use of insulin: Secondary | ICD-10-CM | POA: Diagnosis not present

## 2017-02-22 DIAGNOSIS — J45909 Unspecified asthma, uncomplicated: Secondary | ICD-10-CM | POA: Diagnosis not present

## 2017-02-22 DIAGNOSIS — R911 Solitary pulmonary nodule: Secondary | ICD-10-CM | POA: Diagnosis present

## 2017-02-22 DIAGNOSIS — F329 Major depressive disorder, single episode, unspecified: Secondary | ICD-10-CM | POA: Diagnosis present

## 2017-02-22 DIAGNOSIS — G4700 Insomnia, unspecified: Secondary | ICD-10-CM | POA: Diagnosis not present

## 2017-02-22 DIAGNOSIS — R197 Diarrhea, unspecified: Secondary | ICD-10-CM | POA: Diagnosis not present

## 2017-02-22 DIAGNOSIS — R748 Abnormal levels of other serum enzymes: Secondary | ICD-10-CM | POA: Diagnosis not present

## 2017-02-22 DIAGNOSIS — R778 Other specified abnormalities of plasma proteins: Secondary | ICD-10-CM

## 2017-02-22 DIAGNOSIS — R000 Tachycardia, unspecified: Secondary | ICD-10-CM | POA: Diagnosis not present

## 2017-02-22 DIAGNOSIS — J9601 Acute respiratory failure with hypoxia: Secondary | ICD-10-CM | POA: Diagnosis not present

## 2017-02-22 DIAGNOSIS — J449 Chronic obstructive pulmonary disease, unspecified: Secondary | ICD-10-CM | POA: Diagnosis not present

## 2017-02-22 DIAGNOSIS — Z8249 Family history of ischemic heart disease and other diseases of the circulatory system: Secondary | ICD-10-CM | POA: Diagnosis not present

## 2017-02-22 DIAGNOSIS — M5030 Other cervical disc degeneration, unspecified cervical region: Secondary | ICD-10-CM | POA: Diagnosis not present

## 2017-02-22 DIAGNOSIS — R011 Cardiac murmur, unspecified: Secondary | ICD-10-CM | POA: Diagnosis not present

## 2017-02-22 DIAGNOSIS — R531 Weakness: Secondary | ICD-10-CM | POA: Diagnosis not present

## 2017-02-22 DIAGNOSIS — E860 Dehydration: Secondary | ICD-10-CM | POA: Diagnosis present

## 2017-02-22 DIAGNOSIS — R269 Unspecified abnormalities of gait and mobility: Secondary | ICD-10-CM | POA: Diagnosis not present

## 2017-02-22 DIAGNOSIS — R740 Nonspecific elevation of levels of transaminase and lactic acid dehydrogenase [LDH]: Secondary | ICD-10-CM | POA: Diagnosis not present

## 2017-02-22 DIAGNOSIS — H748X2 Other specified disorders of left middle ear and mastoid: Secondary | ICD-10-CM | POA: Diagnosis not present

## 2017-02-22 DIAGNOSIS — E876 Hypokalemia: Secondary | ICD-10-CM | POA: Diagnosis not present

## 2017-02-22 DIAGNOSIS — B370 Candidal stomatitis: Secondary | ICD-10-CM | POA: Diagnosis not present

## 2017-02-22 HISTORY — DX: Systemic involvement of connective tissue, unspecified: M35.9

## 2017-02-22 HISTORY — DX: Heart failure, unspecified: I50.9

## 2017-02-22 LAB — URINALYSIS, COMPLETE (UACMP) WITH MICROSCOPIC
BACTERIA UA: NONE SEEN
Bilirubin Urine: NEGATIVE
Glucose, UA: NEGATIVE mg/dL
Hgb urine dipstick: NEGATIVE
Ketones, ur: NEGATIVE mg/dL
Leukocytes, UA: NEGATIVE
Nitrite: NEGATIVE
PH: 6 (ref 5.0–8.0)
Protein, ur: NEGATIVE mg/dL

## 2017-02-22 LAB — CBC
HEMATOCRIT: 43.4 % (ref 35.0–47.0)
Hemoglobin: 14.9 g/dL (ref 12.0–16.0)
MCH: 29.8 pg (ref 26.0–34.0)
MCHC: 34.3 g/dL (ref 32.0–36.0)
MCV: 86.9 fL (ref 80.0–100.0)
Platelets: 348 10*3/uL (ref 150–440)
RBC: 5 MIL/uL (ref 3.80–5.20)
RDW: 14.2 % (ref 11.5–14.5)
WBC: 13.8 10*3/uL — ABNORMAL HIGH (ref 3.6–11.0)

## 2017-02-22 LAB — COMPREHENSIVE METABOLIC PANEL
ALBUMIN: 2.7 g/dL — AB (ref 3.5–5.0)
ALT: 12 U/L — AB (ref 14–54)
AST: 32 U/L (ref 15–41)
Alkaline Phosphatase: 80 U/L (ref 38–126)
Anion gap: 13 (ref 5–15)
BUN: 11 mg/dL (ref 6–20)
CHLORIDE: 95 mmol/L — AB (ref 101–111)
CO2: 23 mmol/L (ref 22–32)
Calcium: 8.3 mg/dL — ABNORMAL LOW (ref 8.9–10.3)
Creatinine, Ser: 0.9 mg/dL (ref 0.44–1.00)
GFR calc Af Amer: >60 mL/min (ref >60)
GFR calc non Af Amer: >60 mL/min (ref >60)
GLUCOSE: 238 mg/dL — AB (ref 65–99)
POTASSIUM: 3.2 mmol/L — AB (ref 3.5–5.1)
Sodium: 131 mmol/L — ABNORMAL LOW (ref 135–145)
TOTAL PROTEIN: 6.2 g/dL — AB (ref 6.5–8.1)
Total Bilirubin: 1.3 mg/dL — ABNORMAL HIGH (ref 0.3–1.2)

## 2017-02-22 LAB — GASTROINTESTINAL PANEL BY PCR, STOOL (REPLACES STOOL CULTURE)

## 2017-02-22 LAB — C DIFFICILE QUICK SCREEN W PCR REFLEX
C DIFFICILE (CDIFF) INTERP: DETECTED
C DIFFICILE (CDIFF) TOXIN: POSITIVE — AB
C DIFFICLE (CDIFF) ANTIGEN: POSITIVE — AB

## 2017-02-22 LAB — TROPONIN I
TROPONIN I: 0.07 ng/mL — AB (ref <0.03)
Troponin I: <0.03 ng/mL (ref <0.03)
Troponin I: 0.04 ng/mL (ref <0.03)

## 2017-02-22 LAB — LACTIC ACID, PLASMA
LACTIC ACID, VENOUS: 3.9 mmol/L — AB (ref 0.5–1.9)
Lactic Acid, Venous: 4.2 mmol/L (ref 0.5–1.9)

## 2017-02-22 LAB — MAGNESIUM: Magnesium: 1.5 mg/dL — ABNORMAL LOW (ref 1.7–2.4)

## 2017-02-22 LAB — LIPASE, BLOOD: Lipase: 22 U/L (ref 11–51)

## 2017-02-22 MED ORDER — FENTANYL CITRATE (PF) 100 MCG/2ML IJ SOLN
50.0000 ug | Freq: Once | INTRAMUSCULAR | Status: AC
  Administered 2017-02-22: 50 ug via INTRAVENOUS
  Filled 2017-02-22: qty 2

## 2017-02-22 MED ORDER — PREDNISONE 10 MG PO TABS
5.0000 mg | ORAL_TABLET | Freq: Every day | ORAL | Status: DC
  Administered 2017-02-23 – 2017-02-26 (×4): 5 mg via ORAL
  Filled 2017-02-22 (×4): qty 1

## 2017-02-22 MED ORDER — VANCOMYCIN 50 MG/ML ORAL SOLUTION
125.0000 mg | Freq: Four times a day (QID) | ORAL | Status: DC
  Administered 2017-02-22 – 2017-02-26 (×17): 125 mg via ORAL
  Filled 2017-02-22 (×22): qty 2.5

## 2017-02-22 MED ORDER — MONTELUKAST SODIUM 10 MG PO TABS
10.0000 mg | ORAL_TABLET | Freq: Every day | ORAL | Status: DC
  Administered 2017-02-23 – 2017-02-26 (×4): 10 mg via ORAL
  Filled 2017-02-22 (×4): qty 1

## 2017-02-22 MED ORDER — PREGABALIN 50 MG PO CAPS
50.0000 mg | ORAL_CAPSULE | Freq: Two times a day (BID) | ORAL | Status: DC
  Administered 2017-02-22 – 2017-02-26 (×8): 50 mg via ORAL
  Filled 2017-02-22 (×8): qty 1

## 2017-02-22 MED ORDER — LINAGLIPTIN 5 MG PO TABS
5.0000 mg | ORAL_TABLET | Freq: Every day | ORAL | Status: DC
  Administered 2017-02-23 – 2017-02-26 (×4): 5 mg via ORAL
  Filled 2017-02-22 (×4): qty 1

## 2017-02-22 MED ORDER — SODIUM CHLORIDE 0.9 % IV BOLUS (SEPSIS)
1000.0000 mL | Freq: Once | INTRAVENOUS | Status: AC
  Administered 2017-02-22: 1000 mL via INTRAVENOUS

## 2017-02-22 MED ORDER — PIPERACILLIN-TAZOBACTAM 3.375 G IVPB
3.3750 g | Freq: Three times a day (TID) | INTRAVENOUS | Status: DC
  Filled 2017-02-22: qty 50

## 2017-02-22 MED ORDER — PRAMIPEXOLE DIHYDROCHLORIDE 0.25 MG PO TABS
0.2500 mg | ORAL_TABLET | Freq: Three times a day (TID) | ORAL | Status: DC
  Administered 2017-02-22 – 2017-02-26 (×12): 0.25 mg via ORAL
  Filled 2017-02-22 (×13): qty 1

## 2017-02-22 MED ORDER — DOCUSATE SODIUM 100 MG PO CAPS: 100.0000 mg | ORAL_CAPSULE | Freq: Two times a day (BID) | ORAL | Status: DC | PRN

## 2017-02-22 MED ORDER — ISOSORBIDE MONONITRATE ER 30 MG PO TB24
30.0000 mg | ORAL_TABLET | Freq: Every day | ORAL | Status: DC
  Administered 2017-02-23 – 2017-02-26 (×4): 30 mg via ORAL
  Filled 2017-02-22 (×4): qty 1

## 2017-02-22 MED ORDER — ONDANSETRON HCL 4 MG/2ML IJ SOLN
4.0000 mg | Freq: Once | INTRAMUSCULAR | Status: AC
  Administered 2017-02-22: 4 mg via INTRAVENOUS
  Filled 2017-02-22: qty 2

## 2017-02-22 MED ORDER — IPRATROPIUM-ALBUTEROL 0.5-2.5 (3) MG/3ML IN SOLN
3.0000 mL | Freq: Once | RESPIRATORY_TRACT | Status: AC
  Administered 2017-02-22: 3 mL via RESPIRATORY_TRACT
  Filled 2017-02-22: qty 3

## 2017-02-22 MED ORDER — NORTRIPTYLINE HCL 10 MG PO CAPS
10.0000 mg | ORAL_CAPSULE | Freq: Every day | ORAL | Status: DC
  Administered 2017-02-22: 10 mg via ORAL
  Filled 2017-02-22 (×2): qty 1

## 2017-02-22 MED ORDER — PIPERACILLIN-TAZOBACTAM 3.375 G IVPB 30 MIN
INTRAVENOUS | Status: AC
  Administered 2017-02-22: 3.375 g via INTRAVENOUS
  Filled 2017-02-22: qty 50

## 2017-02-22 MED ORDER — POTASSIUM CHLORIDE IN NACL 40-0.9 MEQ/L-% IV SOLN
INTRAVENOUS | Status: AC
  Administered 2017-02-22: 75 mL/h via INTRAVENOUS
  Filled 2017-02-22: qty 1000

## 2017-02-22 MED ORDER — LAMOTRIGINE 25 MG PO TABS
100.0000 mg | ORAL_TABLET | Freq: Every day | ORAL | Status: DC
  Administered 2017-02-23 – 2017-02-26 (×4): 100 mg via ORAL
  Filled 2017-02-22 (×4): qty 4

## 2017-02-22 MED ORDER — ALBUTEROL SULFATE HFA 108 (90 BASE) MCG/ACT IN AERS: 2.0000 | INHALATION_SPRAY | Freq: Four times a day (QID) | RESPIRATORY_TRACT | Status: DC | PRN

## 2017-02-22 MED ORDER — CIPROFLOXACIN IN D5W 400 MG/200ML IV SOLN
400.0000 mg | Freq: Two times a day (BID) | INTRAVENOUS | Status: DC
  Filled 2017-02-22: qty 200

## 2017-02-22 MED ORDER — ESCITALOPRAM OXALATE 10 MG PO TABS
10.0000 mg | ORAL_TABLET | Freq: Every morning | ORAL | Status: DC
  Administered 2017-02-23 – 2017-02-26 (×4): 10 mg via ORAL
  Filled 2017-02-22 (×4): qty 1

## 2017-02-22 MED ORDER — PIPERACILLIN-TAZOBACTAM 3.375 G IVPB 30 MIN
3.3750 g | Freq: Once | INTRAVENOUS | Status: AC
  Administered 2017-02-22: 3.375 g via INTRAVENOUS

## 2017-02-22 MED ORDER — CLONIDINE HCL 0.1 MG PO TABS
0.1000 mg | ORAL_TABLET | Freq: Two times a day (BID) | ORAL | Status: DC
  Administered 2017-02-22 – 2017-02-26 (×8): 0.1 mg via ORAL
  Filled 2017-02-22 (×8): qty 1

## 2017-02-22 MED ORDER — ALPRAZOLAM 0.25 MG PO TABS
0.2500 mg | ORAL_TABLET | Freq: Every evening | ORAL | Status: DC | PRN
  Administered 2017-02-22 – 2017-02-24 (×3): 0.25 mg via ORAL
  Filled 2017-02-22 (×3): qty 1

## 2017-02-22 MED ORDER — HEPARIN SODIUM (PORCINE) 5000 UNIT/ML IJ SOLN
5000.0000 [IU] | Freq: Three times a day (TID) | INTRAMUSCULAR | Status: DC
  Administered 2017-02-22 – 2017-02-24 (×5): 5000 [IU] via SUBCUTANEOUS
  Filled 2017-02-22 (×5): qty 1

## 2017-02-22 MED ORDER — IOPAMIDOL (ISOVUE-300) INJECTION 61%
75.0000 mL | Freq: Once | INTRAVENOUS | Status: AC | PRN
  Administered 2017-02-22: 75 mL via INTRAVENOUS

## 2017-02-22 MED ORDER — METRONIDAZOLE IN NACL 5-0.79 MG/ML-% IV SOLN
500.0000 mg | Freq: Three times a day (TID) | INTRAVENOUS | Status: DC
  Filled 2017-02-22 (×2): qty 100

## 2017-02-22 MED ORDER — PANTOPRAZOLE SODIUM 40 MG PO TBEC
40.0000 mg | DELAYED_RELEASE_TABLET | Freq: Every day | ORAL | Status: DC
  Administered 2017-02-23: 40 mg via ORAL
  Filled 2017-02-22: qty 1

## 2017-02-22 MED ORDER — ONDANSETRON HCL 4 MG PO TABS: 4.0000 mg | ORAL_TABLET | Freq: Three times a day (TID) | ORAL | Status: DC | PRN

## 2017-02-22 MED ORDER — POTASSIUM CHLORIDE CRYS ER 20 MEQ PO TBCR: 20.0000 meq | EXTENDED_RELEASE_TABLET | Freq: Two times a day (BID) | ORAL | Status: DC

## 2017-02-22 MED ORDER — ALBUTEROL SULFATE (2.5 MG/3ML) 0.083% IN NEBU
2.5000 mg | INHALATION_SOLUTION | Freq: Four times a day (QID) | RESPIRATORY_TRACT | Status: DC | PRN
  Administered 2017-02-24 – 2017-02-25 (×3): 2.5 mg via RESPIRATORY_TRACT
  Filled 2017-02-22 (×4): qty 3

## 2017-02-22 MED ORDER — SODIUM CHLORIDE 0.9 % IV SOLN: INTRAVENOUS | Status: DC

## 2017-02-22 NOTE — ED Notes (Signed)
Walked in to room, pt already on the toilet, unable to obtain sample pt was not given a cup or hat when she was brought the to room from triage

## 2017-02-22 NOTE — ED Notes (Signed)
Pt given cola to drink 

## 2017-02-22 NOTE — ED Triage Notes (Signed)
Diarrhea for 3 weeks.  Went to dr today and they sent her here for dehydration.

## 2017-02-22 NOTE — ED Notes (Signed)
Lab notified to add C-diff to stool sample spoke to [facility]

## 2017-02-22 NOTE — H&P (Addendum)
[facility] [place] NAME: Meghan [name]    MR#:  [id number]  DATE OF BIRTH:  March 28, 1946  DATE OF ADMISSION:  02/22/2017  PRIMARY CARE PHYSICIAN: Lavera Guise, MD   REQUESTING/REFERRING PHYSICIAN: Rifenbark  CHIEF COMPLAINT:   Chief Complaint  Patient presents with  . Abdominal Pain  . Diarrhea  . Weakness    HISTORY OF PRESENT ILLNESS: Meghan [name]  is a 71 y.o. female with a known history of Anxiety, asthma, brain tumor, CHF, collagen vascular disease, COPD, coronary artery disease, depression, diabetes, heart murmur, hypercholesterolemia, hypertension, osteopenia, posttraumatic stress disorder- lives alone at home and uses a walker at baseline. Had sinusitis one month ago and was given some antibiotic at that time. One week after that she started having diarrhea which is watery and almost 8-10 times a day with some pain in her abdomen which is generalized and some nausea but no episodes of vomiting. This is going on for last 2-3 weeks and gradually getting worse and she is not able to have enough oral intake so concerned with this she went to her primary care doctor's office and they sent her into the hospital.  She denies any associated fever or chills, blood in the vomit or stool. In ER CT scan of the abdomen shows colitis, GI panel is sent but still in process. Elevated blood cell count and some low potassium.  PAST MEDICAL HISTORY:   Past Medical History:  Diagnosis Date  . Anginal pain ([place])   . Anxiety   . Arthritis    RA  . Asthma   . Brain tumor (benign) ([place])   . CHF (congestive heart failure) ([place])   . Collagen vascular disease ([place])   . COPD (chronic obstructive pulmonary disease) ([place])   . Coronary artery disease   . DDD (degenerative disc disease)   . Depression   . Diabetes mellitus   . GERD (gastroesophageal reflux disease)   . Headache   . Heart murmur   . Heart murmur   . Hypercholesteremia   . Hypertension   .  Lumbar degenerative disc disease   . Migraines   . Obese   . Obesity   . Osteopenia   . Osteoporosis   . Pneumonia   . PTSD (post-traumatic stress disorder)     PAST SURGICAL HISTORY: Past Surgical History:  Procedure Laterality Date  . ABDOMINAL HYSTERECTOMY    . CERVICAL FUSION    . CHOLECYSTECTOMY    . COLONOSCOPY WITH PROPOFOL N/A 09/20/2015   Procedure: COLONOSCOPY WITH PROPOFOL;  Surgeon: Josefine Class, MD;  Location: [facility] ENDOSCOPY;  Service: Endoscopy;  Laterality: N/A;  . COLONOSCOPY WITH PROPOFOL N/A 01/27/2016   Procedure: COLONOSCOPY WITH PROPOFOL;  Surgeon: Manya Silvas, MD;  Location: [facility] ENDOSCOPY;  Service: Endoscopy;  Laterality: N/A;  . FINGER ARTHROPLASTY  02/23/2012   Procedure: FINGER ARTHROPLASTY;  Surgeon: Cammie Sickle., MD;  Location: [place];  Service: Orthopedics;  Laterality: Left;  Extensor carpi radialis longus to Extensor carpi ulnaris transfer, left Metaphalangeal reconstructions of index and long fingers,  . FOOT ARTHROPLASTY     toes x2 rt foot  . HAND RECONSTRUCTION  2011   right-multiple finger joint reconst  . JOINT REPLACEMENT     bilat knee replacements    SOCIAL HISTORY:  Social History  Substance Use Topics  . Smoking status: Never Smoker  . Smokeless tobacco: Never Used  . Alcohol use No  FAMILY HISTORY:  Family History  Problem Relation Age of Onset  . Depression Sister   . Hypertension Mother   . Arthritis/Rheumatoid Mother   . Heart attack Father   . Hypertension Father   . CAD Unknown   . Hypertension Unknown   . Diabetes Mellitus II Unknown   . Arthritis Unknown     DRUG ALLERGIES:  Allergies  Allergen Reactions  . Gabapentin Other (See Comments)    Pt states that it causes her BP to drop.   . Iodine Other (See Comments)    Reaction:  Syncopy  . Naproxen Hives  . Nsaids Other (See Comments)    Reaction:  Unknown   . Tramadol Itching    REVIEW OF SYSTEMS:   CONSTITUTIONAL:  No fever, fatigue or weakness.  EYES: No blurred or double vision.  EARS, NOSE, AND THROAT: No tinnitus or ear pain.  RESPIRATORY: No cough, shortness of breath, wheezing or hemoptysis.  CARDIOVASCULAR: No chest pain, orthopnea, edema.  GASTROINTESTINAL: Positive for nausea, diarrhea and  abdominal pain. No vomiting. GENITOURINARY: No dysuria, hematuria.  ENDOCRINE: No polyuria, nocturia,  HEMATOLOGY: No anemia, easy bruising or bleeding SKIN: No rash or lesion. MUSCULOSKELETAL: No joint pain or arthritis.   NEUROLOGIC: No tingling, numbness, weakness.  PSYCHIATRY: No anxiety or depression.   MEDICATIONS AT HOME:  Prior to Admission medications   Medication Sig Start Date End Date Taking? Authorizing Provider  albuterol (PROVENTIL HFA;VENTOLIN HFA) 108 (90 BASE) MCG/ACT inhaler Inhale 2 puffs into the lungs every 6 (six) hours as needed for wheezing or shortness of breath.    Yes [provider]  azithromycin (ZITHROMAX) 250 MG tablet Take 250 mg by mouth daily. 02/05/17  Yes [provider]  cloNIDine (CATAPRES) 0.1 MG tablet Take 0.1 mg by mouth 2 (two) times daily.   Yes [provider]  diphenoxylate-atropine (LOMOTIL) 2.5-0.025 MG tablet Take 1 tablet by mouth 3 (three) times daily as needed for diarrhea or loose stools.   Yes [provider]  escitalopram (LEXAPRO) 10 MG tablet Take 1 tablet (10 mg total) by mouth every morning. 09/11/16  Yes Rainey Pines, MD  esomeprazole (NEXIUM) 40 MG capsule Take 40 mg by mouth at bedtime.   Yes [provider]  insulin starter kit- pen needles MISC 1 kit by Other route once. 03/09/15  Yes Aldean Jewett, MD  isosorbide mononitrate (IMDUR) 30 MG 24 hr tablet Take 30 mg by mouth daily.   Yes [provider]  lamoTRIgine (LAMICTAL) 100 MG tablet Take 1 tablet (100 mg total) by mouth daily. 06/02/16  Yes Rainey Pines, MD  meloxicam (MOBIC) 7.5 MG tablet daily.  04/15/16  Yes [provider]   montelukast (SINGULAIR) 10 MG tablet Take 10 mg by mouth daily.   Yes [provider]  nortriptyline (PAMELOR) 10 MG capsule Take 10 mg by mouth at bedtime.   Yes [provider]  ondansetron (ZOFRAN) 4 MG tablet Take 4 mg by mouth 3 (three) times daily.   Yes [provider]  ONE TOUCH ULTRA TEST test strip  02/19/15  Yes [provider]  pramipexole (MIRAPEX) 0.25 MG tablet Take 0.25 mg by mouth.   Yes [provider]  predniSONE (DELTASONE) 5 MG tablet Take 5 mg by mouth daily.    Yes [provider]  pregabalin (LYRICA) 50 MG capsule TAKE ONE CAPSULE BY MOUTH TWICE A DAY 06/18/15  Yes [provider]  saxagliptin HCl (ONGLYZA) 5 MG TABS tablet  Take 5 mg by mouth at bedtime.   Yes [provider]  Syringe, Disposable, 1 ML MISC 300 Syringes by Does not apply route daily. 03/11/15  Yes Aldean Jewett, MD  topiramate (TOPAMAX) 25 MG tablet Take 25 mg by mouth 2 (two) times daily.   Yes [provider]  acyclovir ointment (ZOVIRAX) 5 % Apply topically every 3 (three) hours. Use as needed for blistering. Patient not taking: Reported on 02/22/2017 03/09/15   Aldean Jewett, MD  ALPRAZolam Duanne Moron) 0.25 MG tablet Take 1 tablet (0.25 mg total) by mouth at bedtime as needed for anxiety. Patient not taking: Reported on 02/22/2017 09/11/16   Rainey Pines, MD  sulfamethoxazole-trimethoprim (BACTRIM DS,SEPTRA DS) 800-160 MG tablet Take 1 tablet by mouth 2 (two) times daily.    [provider]      PHYSICAL EXAMINATION:   VITAL SIGNS: Blood pressure 125/83, pulse (!) 117, temperature 98 F (36.7 C), temperature source Oral, resp. rate (!) 22, height 4' 8" (1.422 m), weight 58.5 kg (129 lb), SpO2 98 %.  GENERAL:  71 y.o.-year-old patient lying in the bed with no acute distress.  EYES: Pupils equal, round, reactive to light and accommodation. No scleral icterus. Extraocular muscles intact.  HEENT: Head atraumatic,  normocephalic. Oropharynx and nasopharynx clear.  NECK:  Supple, no jugular venous distention. No thyroid enlargement, no tenderness.  LUNGS: Normal breath sounds bilaterally, no wheezing, rales,rhonchi or crepitation. No use of accessory muscles of respiration.  CARDIOVASCULAR: S1, S2 normal. No murmurs, rubs, or gallops.  ABDOMEN: Soft,  Mild tender, nondistended. Bowel sounds present. No organomegaly or mass.  EXTREMITIES: No pedal edema, cyanosis, or clubbing.  NEUROLOGIC: Cranial nerves II through XII are intact. Muscle strength 5/5 in all extremities. Sensation intact. Gait not checked.  PSYCHIATRIC: The patient is alert and oriented x 3.  SKIN: No obvious rash, lesion, or ulcer.   LABORATORY PANEL:   CBC  Recent Labs Lab 02/22/17 1106  WBC 13.8*  HGB 14.9  HCT 43.4  PLT 348  MCV 86.9  MCH 29.8  MCHC 34.3  RDW 14.2   ------------------------------------------------------------------------------------------------------------------  Chemistries   Recent Labs Lab 02/22/17 1106  NA 131*  K 3.2*  CL 95*  CO2 23  GLUCOSE 238*  BUN 11  CREATININE 0.90  CALCIUM 8.3*  MG 1.5*  AST 32  ALT 12*  ALKPHOS 80  BILITOT 1.3*   ------------------------------------------------------------------------------------------------------------------ estimated creatinine clearance is 41.5 mL/min (by C-G formula based on SCr of 0.9 mg/dL). ------------------------------------------------------------------------------------------------------------------ No results for input(s): TSH, T4TOTAL, T3FREE, THYROIDAB in the last 72 hours.  Invalid input(s): FREET3   Coagulation profile No results for input(s): INR, PROTIME in the last 168 hours. ------------------------------------------------------------------------------------------------------------------- No results for input(s): DDIMER in the last 72  hours. -------------------------------------------------------------------------------------------------------------------  Cardiac Enzymes  Recent Labs Lab 02/22/17 1106  TROPONINI 0.07*   ------------------------------------------------------------------------------------------------------------------ Invalid input(s): POCBNP  ---------------------------------------------------------------------------------------------------------------  Urinalysis    Component Value Date/Time   COLORURINE YELLOW (A) 02/22/2017 1301   APPEARANCEUR CLEAR (A) 02/22/2017 1301   APPEARANCEUR Turbid (A) 05/05/2016 1010   LABSPEC >1.046 (H) 02/22/2017 1301   PHURINE 6.0 02/22/2017 1301   GLUCOSEU NEGATIVE 02/22/2017 1301   HGBUR NEGATIVE 02/22/2017 1301   BILIRUBINUR NEGATIVE 02/22/2017 1301   BILIRUBINUR Negative 05/05/2016 1010   KETONESUR NEGATIVE 02/22/2017 1301   PROTEINUR NEGATIVE 02/22/2017 1301   NITRITE NEGATIVE 02/22/2017 1301   LEUKOCYTESUR NEGATIVE 02/22/2017 1301   LEUKOCYTESUR 1+ (A) 05/05/2016 1010     RADIOLOGY: Ct Abdomen Pelvis  W Contrast  Result Date: 02/22/2017 CLINICAL DATA:  Three-week history of diarrhea. Left-sided abdominal pain. EXAM: CT ABDOMEN AND PELVIS WITH CONTRAST TECHNIQUE: Multidetector CT imaging of the abdomen and pelvis was performed using the standard protocol following bolus administration of intravenous contrast. CONTRAST:  30m ISOVUE-300 IOPAMIDOL (ISOVUE-300) INJECTION 61% COMPARISON:  06/10/2015 FINDINGS: Lower chest: 17 mm cavitary lesion identified posterior left lower lobe. Calcified granuloma identified in the posterior right lower lobe. Hepatobiliary: 19 mm hypervascular lesion in the dome of the liver stable since chest CT of 11/19/2011. Irregular hypervascular lesion in the lateral segment left liver with apparent portal and hepatic venous communication suggesting vascular malformation. Gallbladder surgically absent. No intrahepatic or extrahepatic  biliary dilation. Pancreas: Pancreas diffusely atrophic. No dilatation of the main duct. Spleen: No splenomegaly. No focal mass lesion. 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 Adrenals/Urinary Tract: No adrenal nodule or mass. Small renal cysts noted right kidney. Left kidney unremarkable. No evidence for hydroureter. The urinary bladder appears normal for the degree of distention. Stomach/Bowel: Stomach is nondistended. No gastric wall thickening. No evidence of outlet obstruction. Duodenum is normally positioned as is the ligament of Treitz. No small bowel wall thickening. No small bowel dilatation. The terminal ileum is normal. The appendix is not visualized, but there is no edema or inflammation in the region of the cecum. Circumferential wall thickening is noted in the colon, from the cecal tip to the distal rectum. There is substantial pericolonic edema/ inflammation. Vascular/Lymphatic: There is abdominal aortic atherosclerosis without aneurysm. Portal vein and superior mesenteric vein are patent. Celiac axis and SMA opacified normally. Normal opacification IMA. There is no gastrohepatic or hepatoduodenal ligament lymphadenopathy. No intraperitoneal or retroperitoneal lymphadenopathy. No pelvic sidewall lymphadenopathy. Reproductive: Uterus surgically absent.  There is no adnexal mass. Other: Small volume free fluid is identified around the liver, in both para colic gutters, and in the cul-de-sac. Musculoskeletal: Bone windows reveal no worrisome lytic or sclerotic osseous lesions. IMPRESSION: 1. Diffuse colonic wall thickening with substantial pericolonic edema/inflammation. Imaging features most suggestive of an infectious/inflammatory colitis. 2. 17 mm cavitary lesion posterior left lower lobe. Neoplasm a concern. Consider follow-up PET-CT after resolution of patient's acute symptoms to further evaluate. 3. 19 mm hypervascular lesion in the dome of the liver unchanged since 11/19/2011, consistent  with benign etiology. 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 4.  Aortic Atherosclerois (ICD10-170.0) Electronically Signed   By: EMisty StanleyM.D.   On: 02/22/2017 12:53    EKG: Orders placed or performed during the hospital encounter of 02/22/17  . ED EKG  . ED EKG  . EKG 12-Lead  . EKG 12-Lead    IMPRESSION AND PLAN:  * Acute colitis   Follow-up in GI panel and order C. difficile as patient received antibiotics in the recent past.   We'll give IV fluids and started on IV Cipro and Flagyl for now.  * Lactic acidosis   This is due to dehydration, IV fluid boluses 2 L are given by ER, will continue on IV fluids and hold on diuretics.  * Hypokalemia   Potassium is 3.2.  * Elevated troponin   This is likely secondary to infection and underlying sickness, I will just follow up to make sure he doesn't rise further.  * Questionable lung nodule   This was finding on right lower lobe of lung on CT scan of the abdomen.   We may do further workup once patient improved over I informed the patient to follow up with  her primary care for further workup and CT scan of the chest.  All the records are reviewed and case discussed with ED provider. Management plans discussed with the patient, family and they are in agreement.  CODE STATUS: full. Code Status History    Date Active Date Inactive Code Status Order ID Comments User Context   03/01/2015  9:12 PM 03/10/2015  8:17 PM Full Code [id number]  Demetrios Loll, MD Inpatient   12/24/2014  5:27 AM 12/25/2014  6:32 PM DNR [id number]  Lance Coon, MD Inpatient       TOTAL TIME TAKING CARE OF THIS PATIENT: 50 minutes.    Vaughan Basta M.D on 02/22/2017   Between 7am to 6pm - Pager - [phone]  After 6pm go to [email or web address] - password [email or web address] [facility]  Office  [phone]  CC: Primary care physician; Lavera Guise, MD   Note: This dictation was prepared with Dragon dictation along  with smaller phrase technology. Any transcriptional errors that result from this process are unintentional.

## 2017-02-22 NOTE — ED Notes (Signed)
Pt's friend vickie [phone]

## 2017-02-22 NOTE — ED Provider Notes (Signed)
[facility] Emergency Department Provider Note  ____________________________________________   First MD Initiated Contact with Patient 02/22/17 1108     (approximate)  I have reviewed the triage vital signs and the nursing notes.   HISTORY  Chief Complaint Abdominal Pain; Diarrhea; and Weakness    HPI Meghan [name] is a 71 y.o. female who presents to the emergency department from her primary care physician's office for 3 weeks of diarrhea. The patient states she was sent for possible IV fluids as the primary care physician felt she was dehydrated. Unfortunately the patient arrived with no paperwork and I'm unable to access that physician's electronic medical record system. According to the patient for the past 3 weeks she has had daily episodes of diarrhea and nausea. She has had at least 4 stools a day and sometimes they're loose and watery and sometimes they're formed. She denies fevers or chills. She says she has lost weight and has not been able to tolerate food secondary to nausea. She says her primary care physician has given her a trial of 2 different medications but she does not know what they are and feels like they are not helping very much. She has had at least 2 abdominal surgeries including a cholecystectomy and she does not remember what the other one once. She says that she had a colonoscopy several years ago which was reportedly normal although she was scheduled for another colonoscopy in the near future. She denies taking recent antibiotics recent travel leaving the country or drinking stream or well water.   01/30/16 Colonoscopy results: Impression:           - Three diminutive polyps in the ascending colon,                        removed with a jumbo cold forceps. Resected and                        retrieved.                       - One small polyp in the ascending colon, removed with                        a hot snare. Resected and retrieved.                      - One diminutive polyp in the cecum, removed with a                        jumbo cold forceps. Resected and retrieved.                       - Two diminutive polyps in the transverse colon,                        removed with a cold biopsy forceps. Resected and                        retrieved. Pathology confirmed these polyps were tubular adenomas.   Past Medical History:  Diagnosis Date  . Anginal pain ([place])   . Anxiety   . Arthritis    RA  . Asthma   . Brain tumor (benign) ([place])   . CHF (congestive  heart failure) ([place])   . Collagen vascular disease ([place])   . COPD (chronic obstructive pulmonary disease) ([place])   . Coronary artery disease   . DDD (degenerative disc disease)   . Depression   . Diabetes mellitus   . GERD (gastroesophageal reflux disease)   . Headache   . Heart murmur   . Heart murmur   . Hypercholesteremia   . Hypertension   . Lumbar degenerative disc disease   . Migraines   . Obese   . Obesity   . Osteopenia   . Osteoporosis   . Pneumonia   . PTSD (post-traumatic stress disorder)     Patient Active Problem List   Diagnosis Date Noted  . Seizure ([place]) 01/03/2016  . Chronic tension-type headache, intractable 12/04/2015  . 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 Degeneration of intervertebral disc of lumbar region 07/15/2015  . Seropositive rheumatoid arthritis ([place]) 07/15/2015  . Asthma with acute exacerbation 03/10/2015  . Hypokalemia 03/01/2015  . Hyponatremia 03/01/2015  . DDD (degenerative disc disease), lumbar 01/31/2015  . Arthritis, degenerative 01/31/2015  . Rheumatoid arthritis with rheumatoid factor ([place]) 01/31/2015  . Diabetes mellitus, type II ([place]) 12/24/2014  . HTN (hypertension) 12/24/2014  . Sepsis ([place]) 12/24/2014  . Left knee pain 12/24/2014  . GERD (gastroesophageal reflux disease) 12/24/2014  . COPD (chronic obstructive pulmonary disease) ([place]) 12/24/2014  . Depression 12/24/2014  . Anxiety 12/24/2014  .  Severe bipolar disorder with psychotic features, mood-congruent ([place]) 11/16/2014  . Neurosis, posttraumatic 11/16/2014  . H/O diabetes mellitus 11/16/2014  . H/O: HTN (hypertension) 11/16/2014  . H/O gastric ulcer 11/16/2014  . H/O: obesity 11/16/2014  . H/O arthritis 11/16/2014  . Barton's fracture of distal radius, closed 09/19/2014  . Neuritis or radiculitis due to rupture of lumbar intervertebral disc 05/11/2014  . Cervico-occipital neuralgia 03/26/2014  . Difficulty in walking 03/26/2014  . Difficulty in walking, not elsewhere classified 03/26/2014  . Cervical spine syndrome 01/26/2014  . Cephalalgia 01/09/2014  . Disordered sleep 01/09/2014  . BP (high blood pressure) 10/19/2013  . Adiposity 10/19/2013  . Cardiac murmur 10/19/2013  . PNA (pneumonia) 10/19/2013  . Breath shortness 10/19/2013  . Chronic obstructive pulmonary disease ([place]) 10/19/2013  . Diabetes mellitus ([place]) 10/19/2013    Past Surgical History:  Procedure Laterality Date  . ABDOMINAL HYSTERECTOMY    . CERVICAL FUSION    . CHOLECYSTECTOMY    . COLONOSCOPY WITH PROPOFOL N/A 09/20/2015   Procedure: COLONOSCOPY WITH PROPOFOL;  Surgeon: Josefine Class, MD;  Location: [facility] ENDOSCOPY;  Service: Endoscopy;  Laterality: N/A;  . COLONOSCOPY WITH PROPOFOL N/A 01/27/2016   Procedure: COLONOSCOPY WITH PROPOFOL;  Surgeon: Manya Silvas, MD;  Location: [facility] ENDOSCOPY;  Service: Endoscopy;  Laterality: N/A;  . FINGER ARTHROPLASTY  02/23/2012   Procedure: FINGER ARTHROPLASTY;  Surgeon: Cammie Sickle., MD;  Location: [facility];  Service: Orthopedics;  Laterality: Left;  Extensor carpi radialis longus to Extensor carpi ulnaris transfer, left Metaphalangeal reconstructions of index and long fingers,  . FOOT ARTHROPLASTY     toes x2 rt foot  . HAND RECONSTRUCTION  2011   right-multiple finger joint reconst  . JOINT REPLACEMENT     bilat knee replacements    Prior to Admission medications     Medication Sig Start Date End Date Taking? Authorizing Provider  cloNIDine (CATAPRES) 0.1 MG tablet Take 0.1 mg by mouth 2 (two) times daily.   Yes [provider]  escitalopram (LEXAPRO) 10 MG tablet Take 1 tablet (  10 mg total) by mouth every morning. 09/11/16  Yes Brandy Hale, MD  esomeprazole (NEXIUM) 40 MG capsule Take 40 mg by mouth at bedtime.   Yes [provider]  insulin starter kit- pen needles MISC 1 kit by Other route once. 03/09/15  Yes Gale Journey, MD  isosorbide mononitrate (IMDUR) 30 MG 24 hr tablet Take 30 mg by mouth daily.   Yes [provider]  lamoTRIgine (LAMICTAL) 100 MG tablet Take 1 tablet (100 mg total) by mouth daily. 06/02/16  Yes Brandy Hale, MD  Syringe, Disposable, 1 ML MISC 300 Syringes by Does not apply route daily. 03/11/15  Yes Gale Journey, MD  acyclovir ointment (ZOVIRAX) 5 % Apply topically every 3 (three) hours. Use as needed for blistering. Patient not taking: Reported on 02/22/2017 03/09/15   Gale Journey, MD  albuterol (PROVENTIL HFA;VENTOLIN HFA) 108 (90 BASE) MCG/ACT inhaler Inhale 2 puffs into the lungs every 6 (six) hours as needed for wheezing or shortness of breath.     [provider]  alendronate (FOSAMAX) 70 MG tablet Take 70 mg by mouth once a week. Pt takes on Monday.    [provider]  ALPRAZolam Prudy Feeler) 0.25 MG tablet Take 1 tablet (0.25 mg total) by mouth at bedtime as needed for anxiety. Patient not taking: Reported on 02/22/2017 09/11/16   Brandy Hale, MD  budesonide-formoterol [facility]) 80-4.5 MCG/ACT inhaler Inhale 2 puffs into the lungs 2 (two) times daily.    [provider]  Cholecalciferol (VITAMIN D) 2000 UNITS CAPS Take 1 capsule by mouth daily.    [provider]  ciclopirox (PENLAC) 8 % solution  12/27/15   [provider]  ketoconazole (NIZORAL) 2 % cream Apply 1 application topically at bedtime.    [provider]  levocetirizine  (XYZAL) 5 MG tablet Take 5 mg by mouth daily.     [provider]  meloxicam (MOBIC) 7.5 MG tablet  04/15/16   [provider]  nystatin (MYCOSTATIN) 100000 UNIT/ML suspension  02/19/15   [provider]  ONE TOUCH ULTRA TEST test strip  02/19/15   [provider]  potassium chloride SA (K-DUR,KLOR-CON) 20 MEQ tablet  03/18/15   [provider]  predniSONE (DELTASONE) 5 MG tablet Take 5 mg by mouth at bedtime.     [provider]  pregabalin (LYRICA) 50 MG capsule Take 50 mg by mouth 2 (two) times daily.    [provider]  pregabalin (LYRICA) 50 MG capsule TAKE ONE CAPSULE BY MOUTH TWICE A DAY 06/18/15   [provider]  prochlorperazine (COMPAZINE) 5 MG tablet Take 5 mg by mouth every 6 (six) hours as needed for nausea or vomiting.    [provider]  saxagliptin HCl (ONGLYZA) 5 MG TABS tablet Take 5 mg by mouth at bedtime.    [provider]  Tofacitinib Citrate 5 MG TABS Take 1 tablet by mouth 2 (two) times daily.     [provider]    Allergies Gabapentin; Iodine; Naproxen; Nsaids; and Tramadol  Family History  Problem Relation Age of Onset  . Depression Sister   . Hypertension Mother   . Arthritis/Rheumatoid Mother   . Heart attack Father   . Hypertension Father   . CAD Unknown   . Hypertension Unknown   . Diabetes Mellitus II Unknown   . Arthritis Unknown     Social History Social History  Substance Use Topics  . Smoking status: Never Smoker  .  Smokeless tobacco: Never Used  . Alcohol use No    Review of Systems Constitutional: No fever/chills Eyes: No visual changes. ENT: No sore throat. Cardiovascular: Denies chest pain. Respiratory: Denies shortness of breath. Gastrointestinal: Positive abdominal pain.  Positive nausea, no vomiting.  Positive diarrhea.  No constipation. Genitourinary: Negative for dysuria. Musculoskeletal: Negative for back pain. Skin: Negative for  rash. Neurological: Negative for headaches, focal weakness or numbness.   ____________________________________________   PHYSICAL EXAM:  VITAL SIGNS: ED Triage Vitals  Enc Vitals Group     BP 02/22/17 1032 109/61     Pulse Rate 02/22/17 1032 (!) 123     Resp 02/22/17 1032 16     Temp 02/22/17 1032 98 F (36.7 C)     Temp Source 02/22/17 1032 Oral     SpO2 02/22/17 1032 95 %     Weight 02/22/17 1032 129 lb (58.5 kg)     Height 02/22/17 1032 '4\' 8"'$  (1.422 m)     Head Circumference --      Peak Flow --      Pain Score 02/22/17 1031 10     Pain Loc --      Pain Edu? --      Excl. in [place]? --     Constitutional: Alert and oriented 4 pleasant cooperative quite hard of hearing no diaphoresis speaks in full clear sentences Eyes: PERRL EOMI. Head: Atraumatic. Nose: No congestion/rhinnorhea. Mouth/Throat: No trismus Neck: No stridor.   Cardiovascular: Tachycardic rate, regular rhythm. Grossly normal heart sounds.  Good peripheral circulation. Respiratory: Normal respiratory effort.  No retractions. Lungs CTAB and moving good air Gastrointestinal: Soft abdomen diffusely tender most prominently on the left with rebound but no guarding and no frank peritonitis Musculoskeletal: No lower extremity edema   Neurologic:  Normal speech and language. No gross focal neurologic deficits are appreciated. Skin:  Skin is warm, dry and intact. No rash noted. Psychiatric: Mood and affect are normal. Speech and behavior are normal.    ____________________________________________   DIFFERENTIAL includes but not limited to  Clostridium difficile, infectious colitis, diverticulitis, irritable bowel syndrome, Crohn's disease, ulcerative colitis ____________________________________________   LABS (all labs ordered are listed, but only abnormal results are displayed)  Labs Reviewed  COMPREHENSIVE METABOLIC PANEL - Abnormal; Notable for the following:       Result Value   Sodium 131 (*)     Potassium 3.2 (*)    Chloride 95 (*)    Glucose, Bld 238 (*)    Calcium 8.3 (*)    Total Protein 6.2 (*)    Albumin 2.7 (*)    ALT 12 (*)    Total Bilirubin 1.3 (*)    All other components within normal limits  CBC - Abnormal; Notable for the following:    WBC 13.8 (*)    All other components within normal limits  TROPONIN I - Abnormal; Notable for the following:    Troponin I 0.07 (*)    All other components within normal limits  GASTROINTESTINAL PANEL BY PCR, STOOL (REPLACES STOOL CULTURE)  CULTURE, BLOOD (ROUTINE X 2)  CULTURE, BLOOD (ROUTINE X 2)  LIPASE, BLOOD  URINALYSIS, COMPLETE (UACMP) WITH MICROSCOPIC  LACTIC ACID, PLASMA  LACTIC ACID, PLASMA    Low albumin concerning for chronic starvation elevated white count suggestive of acute stress response and elevated troponin likely not secondary to primary cardiac issue but to demand from her infection __________________________________________  EKG  ED ECG REPORT I, Darel Hong, the attending physician, personally  viewed and interpreted this ECG.  Date: 02/22/2017 Rate: 118 Rhythm: Sinus tachycardia QRS Axis: normal Intervals: normal ST/T Wave abnormalities: normal Narrative Interpretation: Left bundle branch block but otherwise unremarkable. Unfortunately the most recent EKG I am able to visualize myself is from 2016 however on chart review it appears as bundle branch has been present since at least January of this year  ___  08/17/2016 Stress test: ECG Interpretation: Rest [id number] sinus rhythm, none incomplete left bundle branch block Stress LTH:[id number] sinus rhythm, incomplete left bundle branch block with  interventricular conduction delay Recovery [id number] sinus rhythm ECG Interpretation:non-diagnostic due to pharmacologic testing._________________________________________  RADIOLOGY  CT scan of the abdomen and pelvis with likely diffuse colitis without obvious surgical  etiology ____________________________________________   PROCEDURES  Procedure(s) performed: no  Procedures  Critical Care performed: yes  CRITICAL CARE Performed by: Darel Hong   Total critical care time: 35 minutes  Critical care time was exclusive of separately billable procedures and treating other patients.  Critical care was necessary to treat or prevent imminent or life-threatening deterioration.  Critical care was time spent personally by me on the following activities: development of treatment plan with patient and/or surrogate as well as nursing, discussions with consultants, evaluation of patient's response to treatment, examination of patient, obtaining history from patient or surrogate, ordering and performing treatments and interventions, ordering and review of laboratory studies, ordering and review of radiographic studies, pulse oximetry and re-evaluation of patient's condition.   Observation: no ____________________________________________   INITIAL IMPRESSION / ASSESSMENT AND PLAN / ED COURSE  Pertinent labs & imaging results that were available during my care of the patient were reviewed by me and considered in my medical decision making (see chart for details).  The patient arrives tachycardic although relatively well-appearing. She does have a significantly tender abdomen. At this point given her chronic diarrhea and chronic symptoms and infectious etiology is less likely, given her tenderness overtly she warrants a CT scan of her abdomen today. IV fluids and antiemetics now.     The patient's heart rate is improved somewhat but still remains tachycardic and uncomfortable now requiring intravenous opioid pain medication. Her CT scan is concerning for diffuse colitis for which she A treat with IV Zosyn. At this point given her deconditioned state, tachycardia, and multiple abnormal laboratory findings and believe she warrants inpatient admission for  continued IV resuscitation as well as IV antibiotics. ____________________________________________   FINAL CLINICAL IMPRESSION(S) / ED DIAGNOSES  Final diagnoses:  Elevated troponin  Diarrhea, unspecified type  Generalized abdominal pain  Nausea  Colitis      NEW MEDICATIONS STARTED DURING THIS VISIT:  New Prescriptions   No medications on file     Note:  This document was prepared using Dragon voice recognition software and may include unintentional dictation errors.     Darel Hong, MD 02/22/17 1330

## 2017-02-23 LAB — GLUCOSE, CAPILLARY
GLUCOSE-CAPILLARY: 182 mg/dL — AB (ref 65–99)
GLUCOSE-CAPILLARY: 220 mg/dL — AB (ref 65–99)
GLUCOSE-CAPILLARY: 206 mg/dL — AB (ref 65–99)

## 2017-02-23 LAB — BASIC METABOLIC PANEL
ANION GAP: 8 (ref 5–15)
BUN: 8 mg/dL (ref 6–20)
CHLORIDE: 104 mmol/L (ref 101–111)
CO2: 21 mmol/L — ABNORMAL LOW (ref 22–32)
Calcium: 7.7 mg/dL — ABNORMAL LOW (ref 8.9–10.3)
Creatinine, Ser: 0.6 mg/dL (ref 0.44–1.00)
Glucose, Bld: 104 mg/dL — ABNORMAL HIGH (ref 65–99)
POTASSIUM: 3 mmol/L — AB (ref 3.5–5.1)
Sodium: 133 mmol/L — ABNORMAL LOW (ref 135–145)

## 2017-02-23 LAB — CBC
HEMATOCRIT: 39 % (ref 35.0–47.0)
HEMOGLOBIN: 13.9 g/dL (ref 12.0–16.0)
MCH: 30.7 pg (ref 26.0–34.0)
MCHC: 35.6 g/dL (ref 32.0–36.0)
MCV: 86.3 fL (ref 80.0–100.0)
Platelets: 312 10*3/uL (ref 150–440)
RBC: 4.52 MIL/uL (ref 3.80–5.20)
RDW: 14.4 % (ref 11.5–14.5)
WBC: 8.5 10*3/uL (ref 3.6–11.0)

## 2017-02-23 LAB — LACTIC ACID, PLASMA: Lactic Acid, Venous: 2.9 mmol/L (ref 0.5–1.9)

## 2017-02-23 LAB — TROPONIN I

## 2017-02-23 MED ORDER — ENSURE ENLIVE PO LIQD
237.0000 mL | Freq: Two times a day (BID) | ORAL | Status: DC
  Administered 2017-02-24 – 2017-02-26 (×5): 237 mL via ORAL

## 2017-02-23 MED ORDER — TOPIRAMATE 25 MG PO TABS
25.0000 mg | ORAL_TABLET | Freq: Two times a day (BID) | ORAL | Status: DC
  Administered 2017-02-23 – 2017-02-26 (×6): 25 mg via ORAL
  Filled 2017-02-23 (×7): qty 1

## 2017-02-23 MED ORDER — POTASSIUM CHLORIDE CRYS ER 20 MEQ PO TBCR
40.0000 meq | EXTENDED_RELEASE_TABLET | Freq: Once | ORAL | Status: AC
  Administered 2017-02-23: 40 meq via ORAL
  Filled 2017-02-23: qty 2

## 2017-02-23 MED ORDER — MAGNESIUM SULFATE 2 GM/50ML IV SOLN
2.0000 g | Freq: Once | INTRAVENOUS | Status: AC
  Administered 2017-02-23: 2 g via INTRAVENOUS
  Filled 2017-02-23: qty 50

## 2017-02-23 MED ORDER — INSULIN ASPART 100 UNIT/ML ~~LOC~~ SOLN
0.0000 [IU] | Freq: Three times a day (TID) | SUBCUTANEOUS | Status: DC
  Administered 2017-02-23: 3 [IU] via SUBCUTANEOUS
  Administered 2017-02-23: 2 [IU] via SUBCUTANEOUS
  Administered 2017-02-24: 1 [IU] via SUBCUTANEOUS
  Administered 2017-02-24 (×2): 2 [IU] via SUBCUTANEOUS
  Administered 2017-02-25: 1 [IU] via SUBCUTANEOUS
  Administered 2017-02-26: 3 [IU] via SUBCUTANEOUS
  Filled 2017-02-23 (×7): qty 1

## 2017-02-23 MED ORDER — INSULIN ASPART 100 UNIT/ML ~~LOC~~ SOLN
0.0000 [IU] | Freq: Every day | SUBCUTANEOUS | Status: DC
  Administered 2017-02-23: 2 [IU] via SUBCUTANEOUS
  Filled 2017-02-23: qty 1

## 2017-02-23 MED ORDER — POTASSIUM CHLORIDE IN NACL 40-0.9 MEQ/L-% IV SOLN
INTRAVENOUS | Status: DC
  Administered 2017-02-23 (×2): 100 mL/h via INTRAVENOUS
  Filled 2017-02-23 (×4): qty 1000

## 2017-02-23 MED ORDER — NORTRIPTYLINE HCL 10 MG PO CAPS
10.0000 mg | ORAL_CAPSULE | Freq: Every evening | ORAL | Status: DC | PRN
  Filled 2017-02-23: qty 1

## 2017-02-23 NOTE — Progress Notes (Signed)
Initial Nutrition Assessment  DOCUMENTATION CODES:   Not applicable  INTERVENTION:  Provide Ensure Enlive po BID, each supplement provides 350 kcal and 20 grams of protein.   Provide 2% milk TID on trays.  NUTRITION DIAGNOSIS:   Inadequate oral intake related to poor appetite, nausea, other (see comment) (abdominal pain, diarrhea) as evidenced by per patient/family report.  GOAL:   Patient will meet greater than or equal to 90% of their needs  MONITOR:   PO intake, Supplement acceptance, Labs, Weight trends, I & O's  REASON FOR ASSESSMENT:   Malnutrition Screening Tool    ASSESSMENT:   71 year old female with PMHx of COPD, DM type 2, HTN, depression, anxiety, arthritis, GERD, osteopenia, PTSD, CAD, osteoporosis, PNA, hypercholesterolemia, CHF, brain tumor who presented with watery diarrhea, abdominal pain, nausea admitted with acute colitis, lactic acidosis, hypokalemia, questionable lung nodule.   Spoke with patient at bedside. She reports she has had a poor appetite for the past 3 weeks. She is having nausea, abdominal pain, diarrhea. Denies any emesis. Patient reports she has not been eating any food. She is able to tolerate liquids at home. Patient unable to specify exactly what she has been taking in at home because she kept falling asleep. She reports she enjoys milk and is also willing to drink Ensure to help meet her calorie/protein needs.  Patient reports UBW 183 lbs. Do not see weight in chart of 183 lbs (searched back to 2013). Per chart she was 147 lbs on 02/28/2016 and has lost 18 lbs (12.2% body weight) over the past year, which is not significant for time frame.   Medications reviewed and include: Novolog 0-9 units TID, Novolog 0-5 units QHS, prednisone 5 mg daily, vancomycin, NS with KCl 40 mEq/L @ 100 ml/hr, magnesium sulfate 2 grams IV once today.   Labs reviewed: CBG 182-220, Sodium 133, Potassium 3, CO2 21, Lactic Acid 2.9.   Nutrition-Focused physical exam  completed. Findings are mild-moderate fat depletion in upper arm region, no muscle depletion, and no edema.   Patient does not meet criteria for malnutrition at this time.  Discussed with RN.   Diet Order:  Diet full liquid Room service appropriate? Yes; Fluid consistency: Thin  Skin:  Reviewed, no issues  Last BM:  02/22/2017 - type 5  Height:   Ht Readings from Last 1 Encounters:  02/22/17 4\' 8"  (1.422 m)    Weight:   Wt Readings from Last 1 Encounters:  02/22/17 129 lb (58.5 kg)    Ideal Body Weight:  40.9 kg  BMI:  Body mass index is 28.92 kg/m.  Estimated Nutritional Needs:   Kcal:  1460-1755 (25-30 kcal/kg)  Protein:  60-70 grams (1-1.2 grams/kg)  Fluid:  1.8 L/day (30 ml/kg)  EDUCATION NEEDS:   No education needs identified at this time  Willey Blade, MS, RD, LDN Pager: [phone] After Hours Pager: [phone]

## 2017-02-23 NOTE — Progress Notes (Signed)
[facility] NAME: Meghan [name]    MR#:  [id number]  DATE OF BIRTH:  04/26/1946  SUBJECTIVE:   Patient here due to abdominal pain, diarrhea and noted to be positive for C. difficile. Diarrhea has improved since yesterday. Still having some abdominal pain and cramping. No nausea, vomiting.  REVIEW OF SYSTEMS:    Review of Systems  Constitutional: Negative for chills and fever.  HENT: Negative for congestion and tinnitus.   Eyes: Negative for blurred vision and double vision.  Respiratory: Negative for cough, shortness of breath and wheezing.   Cardiovascular: Negative for chest pain, orthopnea and PND.  Gastrointestinal: Positive for abdominal pain. Negative for diarrhea, nausea and vomiting.  Genitourinary: Negative for dysuria and hematuria.  Neurological: Negative for dizziness, sensory change and focal weakness.  All other systems reviewed and are negative.   Nutrition: Full liquid Tolerating Diet: Yes Tolerating PT: Await Eval.   DRUG ALLERGIES:   Allergies  Allergen Reactions  . Gabapentin Other (See Comments)    Pt states that it causes her BP to drop.   . Iodine Other (See Comments)    Reaction:  Syncopy  . Naproxen Hives  . Nsaids Other (See Comments)    Reaction:  Unknown   . Tramadol Itching    VITALS:  Blood pressure 113/70, pulse (!) 120, temperature 98.5 F (36.9 C), temperature source Oral, resp. rate 18, height 4\' 8"  (1.422 m), weight 58.5 kg (129 lb), SpO2 97 %.  PHYSICAL EXAMINATION:   Physical Exam  GENERAL:  71 y.o.-year-old patient lying in bed in no acute distress.  EYES: Pupils equal, round, reactive to light and accommodation. No scleral icterus. Extraocular muscles intact.  HEENT: Head atraumatic, normocephalic. Oropharynx and nasopharynx clear.  NECK:  Supple, no jugular venous distention. No thyroid enlargement, no tenderness.  LUNGS: Normal breath sounds bilaterally, no wheezing, rales,  rhonchi. No use of accessory muscles of respiration.  CARDIOVASCULAR: S1, S2 normal. No murmurs, rubs, or gallops.  ABDOMEN: Soft, nontender, nondistended. Bowel sounds present. No organomegaly or mass.  EXTREMITIES: No cyanosis, clubbing or edema b/l.    NEUROLOGIC: Cranial nerves II through XII are intact. No focal Motor or sensory deficits b/l. Globally weak.   PSYCHIATRIC: The patient is alert and oriented x 3.  SKIN: No obvious rash, lesion, or ulcer.    LABORATORY PANEL:   CBC  Recent Labs Lab 02/23/17 0013  WBC 8.5  HGB 13.9  HCT 39.0  PLT 312   ------------------------------------------------------------------------------------------------------------------  Chemistries   Recent Labs Lab 02/22/17 1106 02/23/17 0013  NA 131* 133*  K 3.2* 3.0*  CL 95* 104  CO2 23 21*  GLUCOSE 238* 104*  BUN 11 8  CREATININE 0.90 0.60  CALCIUM 8.3* 7.7*  MG 1.5*  --   AST 32  --   ALT 12*  --   ALKPHOS 80  --   BILITOT 1.3*  --    ------------------------------------------------------------------------------------------------------------------  Cardiac Enzymes  Recent Labs Lab 02/23/17 0013  TROPONINI <0.03   ------------------------------------------------------------------------------------------------------------------  RADIOLOGY:  Ct Abdomen Pelvis W Contrast  Result Date: 02/22/2017 CLINICAL DATA:  Three-week history of diarrhea. Left-sided abdominal pain. EXAM: CT ABDOMEN AND PELVIS WITH CONTRAST TECHNIQUE: Multidetector CT imaging of the abdomen and pelvis was performed using the standard protocol following bolus administration of intravenous contrast. CONTRAST:  66mL ISOVUE-300 IOPAMIDOL (ISOVUE-300) INJECTION 61% COMPARISON:  06/10/2015 FINDINGS: Lower chest: 17 mm cavitary lesion identified posterior left lower lobe. Calcified granuloma  identified in the posterior right lower lobe. Hepatobiliary: 19 mm hypervascular lesion in the dome of the liver stable since  chest CT of 11/19/2011. Irregular hypervascular lesion in the lateral segment left liver with apparent portal and hepatic venous communication suggesting vascular malformation. Gallbladder surgically absent. No intrahepatic or extrahepatic biliary dilation. Pancreas: Pancreas diffusely atrophic. No dilatation of the main duct. Spleen: No splenomegaly. No focal mass lesion. 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 Adrenals/Urinary Tract: No adrenal nodule or mass. Small renal cysts noted right kidney. Left kidney unremarkable. No evidence for hydroureter. The urinary bladder appears normal for the degree of distention. Stomach/Bowel: Stomach is nondistended. No gastric wall thickening. No evidence of outlet obstruction. Duodenum is normally positioned as is the ligament of Treitz. No small bowel wall thickening. No small bowel dilatation. The terminal ileum is normal. The appendix is not visualized, but there is no edema or inflammation in the region of the cecum. Circumferential wall thickening is noted in the colon, from the cecal tip to the distal rectum. There is substantial pericolonic edema/ inflammation. Vascular/Lymphatic: There is abdominal aortic atherosclerosis without aneurysm. Portal vein and superior mesenteric vein are patent. Celiac axis and SMA opacified normally. Normal opacification IMA. There is no gastrohepatic or hepatoduodenal ligament lymphadenopathy. No intraperitoneal or retroperitoneal lymphadenopathy. No pelvic sidewall lymphadenopathy. Reproductive: Uterus surgically absent.  There is no adnexal mass. Other: Small volume free fluid is identified around the liver, in both para colic gutters, and in the cul-de-sac. Musculoskeletal: Bone windows reveal no worrisome lytic or sclerotic osseous lesions. IMPRESSION: 1. Diffuse colonic wall thickening with substantial pericolonic edema/inflammation. Imaging features most suggestive of an infectious/inflammatory colitis. 2. 17 mm cavitary  lesion posterior left lower lobe. Neoplasm a concern. Consider follow-up PET-CT after resolution of patient's acute symptoms to further evaluate. 3. 19 mm hypervascular lesion in the dome of the liver unchanged since 11/19/2011, consistent with benign etiology. 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 4.  Aortic Atherosclerois (ICD10-170.0) Electronically Signed   By: Misty Stanley M.D.   On: 02/22/2017 12:53     ASSESSMENT AND PLAN:   71 year old female with past medical history of collagen vascular disease, COPD, diabetes, hypertension, hyperlipidemia, osteoporosis, PTSD, history of brain tumor who presents to the hospital due to abdominal pain and diarrhea. Positive for C. Difficile.  1. C. difficile colitis-this to cause of patient's abdominal pain and diarrhea. Patient CT scan was suggestive of infectious/inflammatory colitis. -Continue full liquid diet, continue oral vancomycin. Clinically improved since yesterday. Still having some mild abdominal cramping but no nausea vomiting or further diarrhea.  2. Hypokalemia-secondary to diarrhea. We'll continue to replace and repeat level in the morning. -Check magnesium level.  3. Essential hypertension-continue clonidine, Imdur  4. Diabetes type 2 without complication-continue sliding scale insulin, Tradjenta. Blood sugar stable. - will check a hemoglobin A1c, appreciated coordinator Input.   5. DM Neuropathy - cont. Lyrica.   6. GERD - cont. Protonix.   7. Depression/anxiety-continue Xanax, Lexapro.     All the records are reviewed and case discussed with Care Management/Social Worker. Management plans discussed with the patient, family and they are in agreement.  CODE STATUS: Full  DVT Prophylaxis: Heparin subcutaneous  TOTAL TIME TAKING CARE OF THIS PATIENT: 30 minutes.   POSSIBLE D/C IN 1-2 DAYS, DEPENDING ON CLINICAL CONDITION.   Henreitta Leber M.D on 02/23/2017 at 2:35 PM  Between 7am to 6pm - Pager  - [phone]  After 6pm go to [email or web address] - [place]  [facility]  Office  [phone]  CC: Primary care physician; Lavera Guise, MD

## 2017-02-23 NOTE — Progress Notes (Signed)
Inpatient Diabetes Program Recommendations  AACE/ADA: New Consensus Statement on Inpatient Glycemic Control (2015)  Target Ranges:  Prepandial:   less than 140 mg/dL      Peak postprandial:   less than 180 mg/dL (1-2 hours)      Critically ill patients:  140 - 180 mg/dL   Lab Results  Component Value Date   GLUCAP 167 (H) 01/27/2016   HGBA1C 10.3 (H) 03/01/2015    Review of Glycemic Control- lab glucose  Results for AVAROSE, MERVINE (MRN [id number]) as of 02/23/2017 09:53  Ref. Range 02/22/2017 11:06  Glucose Latest Ref Range: 65 - 99 mg/dL 238 (H)   Results for RHIANON, ZABAWA (MRN [id number]) as of 02/23/2017 09:53  Ref. Range 02/23/2017 00:13  Glucose Latest Ref Range: 65 - 99 mg/dL 104 (H)   Diabetes history: Type 2 Outpatient Diabetes medications: Tradgenta 5mg /dl Current orders for Inpatient glycemic control: Tradgenta 5mg /day  Inpatient Diabetes Program Recommendations:    Per ADA recommendations "consider performing an A1C on all patients with diabetes or hyperglycemia admitted to the hospital if not performed in the prior 3 months".  Please change diet to full liquid- carb modified  Please consider ordering CBG tid/hs with Novolog 0-9 units tid.  Gentry Fitz, RN, BA, MHA, CDE Diabetes Coordinator Inpatient Diabetes Program  [phone] (Team Pager) [phone] ([place]) 02/23/2017 9:55 AM

## 2017-02-24 ENCOUNTER — Inpatient Hospital Stay: Payer: Medicare Other

## 2017-02-24 LAB — MAGNESIUM: Magnesium: 2 mg/dL (ref 1.7–2.4)

## 2017-02-24 LAB — BASIC METABOLIC PANEL
Anion gap: 4 — ABNORMAL LOW (ref 5–15)
BUN: 7 mg/dL (ref 6–20)
CALCIUM: 7.7 mg/dL — AB (ref 8.9–10.3)
CO2: 21 mmol/L — AB (ref 22–32)
CREATININE: 0.65 mg/dL (ref 0.44–1.00)
Chloride: 110 mmol/L (ref 101–111)
GLUCOSE: 129 mg/dL — AB (ref 65–99)
Potassium: 4.7 mmol/L (ref 3.5–5.1)
Sodium: 135 mmol/L (ref 135–145)

## 2017-02-24 LAB — GLUCOSE, CAPILLARY
Glucose-Capillary: 131 mg/dL — ABNORMAL HIGH (ref 65–99)
Glucose-Capillary: 194 mg/dL — ABNORMAL HIGH (ref 65–99)
Glucose-Capillary: 186 mg/dL — ABNORMAL HIGH (ref 65–99)
Glucose-Capillary: 185 mg/dL — ABNORMAL HIGH (ref 65–99)

## 2017-02-24 MED ORDER — ENOXAPARIN SODIUM 40 MG/0.4ML ~~LOC~~ SOLN
40.0000 mg | SUBCUTANEOUS | Status: DC
  Administered 2017-02-24 – 2017-02-25 (×2): 40 mg via SUBCUTANEOUS
  Filled 2017-02-24 (×2): qty 0.4

## 2017-02-24 MED ORDER — IOPAMIDOL (ISOVUE-370) INJECTION 76%
75.0000 mL | Freq: Once | INTRAVENOUS | Status: AC | PRN
  Administered 2017-02-24: 75 mL via INTRAVENOUS

## 2017-02-24 MED ORDER — DICYCLOMINE HCL 10 MG PO CAPS
10.0000 mg | ORAL_CAPSULE | Freq: Three times a day (TID) | ORAL | Status: DC
  Administered 2017-02-24 – 2017-02-26 (×8): 10 mg via ORAL
  Filled 2017-02-24 (×8): qty 1

## 2017-02-24 NOTE — Progress Notes (Signed)
[facility] [place] NAME: Meghan [name]    MR#:  [id number]  DATE OF BIRTH:  June 02, 1946  SUBJECTIVE:   Still having some abdominal cramping. Diarrhea has improved. Afebrile. Noted to be tachycardic and feeling somewhat short of breath today.  REVIEW OF SYSTEMS:    Review of Systems  Constitutional: Negative for chills and fever.  HENT: Negative for congestion and tinnitus.   Eyes: Negative for blurred vision and double vision.  Respiratory: Negative for cough, shortness of breath and wheezing.   Cardiovascular: Negative for chest pain, orthopnea and PND.  Gastrointestinal: Positive for abdominal pain and diarrhea. Negative for nausea and vomiting.  Genitourinary: Negative for dysuria and hematuria.  Neurological: Negative for dizziness, sensory change and focal weakness.  All other systems reviewed and are negative.   Nutrition: Full liquid Tolerating Diet: Yes Tolerating PT: Await Eval.   DRUG ALLERGIES:   Allergies  Allergen Reactions  . Gabapentin Other (See Comments)    Pt states that it causes her BP to drop.   . Naproxen Hives  . Nsaids Other (See Comments)    Reaction:  Unknown   . Tramadol Itching    VITALS:  Blood pressure 113/75, pulse (!) 119, temperature 98.8 F (37.1 C), temperature source Oral, resp. rate 18, height 4\' 8"  (1.422 m), weight 58.5 kg (129 lb), SpO2 99 %.  PHYSICAL EXAMINATION:   Physical Exam  GENERAL:  71 y.o.-year-old patient lying in bed in mild Resp. disterss.  EYES: Pupils equal, round, reactive to light and accommodation. No scleral icterus. Extraocular muscles intact.  HEENT: Head atraumatic, normocephalic. Oropharynx and nasopharynx clear.  NECK:  Supple, no jugular venous distention. No thyroid enlargement, no tenderness.  LUNGS: Prolonged Insp. & exp. phase, no wheezing, rales, rhonchi. + use of accessory muscles.   CARDIOVASCULAR: S1, S2 normal. No murmurs, rubs, or gallops.  ABDOMEN:  Soft, nontender, nondistended. Bowel sounds present. No organomegaly or mass.  EXTREMITIES: No cyanosis, clubbing or edema b/l.    NEUROLOGIC: Cranial nerves II through XII are intact. No focal Motor or sensory deficits b/l. Globally weak.   PSYCHIATRIC: The patient is alert and oriented x 3.  SKIN: No obvious rash, lesion, or ulcer.    LABORATORY PANEL:   CBC  Recent Labs Lab 02/23/17 0013  WBC 8.5  HGB 13.9  HCT 39.0  PLT 312   ------------------------------------------------------------------------------------------------------------------  Chemistries   Recent Labs Lab 02/22/17 1106  02/24/17 0458  NA 131*  < > 135  K 3.2*  < > 4.7  CL 95*  < > 110  CO2 23  < > 21*  GLUCOSE 238*  < > 129*  BUN 11  < > 7  CREATININE 0.90  < > 0.65  CALCIUM 8.3*  < > 7.7*  MG 1.5*  --  2.0  AST 32  --   --   ALT 12*  --   --   ALKPHOS 80  --   --   BILITOT 1.3*  --   --   < > = values in this interval not displayed. ------------------------------------------------------------------------------------------------------------------  Cardiac Enzymes  Recent Labs Lab 02/23/17 0013  TROPONINI <0.03   ------------------------------------------------------------------------------------------------------------------  RADIOLOGY:  No results found.   ASSESSMENT AND PLAN:   71 year old female with past medical history of collagen vascular disease, COPD, diabetes, hypertension, hyperlipidemia, osteoporosis, PTSD, history of brain tumor who presents to the hospital due to abdominal pain and diarrhea. Positive for C. Difficile.  1.  C. difficile colitis-this is the cause of patient's abdominal pain and diarrhea. Patient CT scan was suggestive of infectious/inflammatory colitis. -Still having some abdominal cramping and will start on some Bentyl. Continue oral vancomycin. We'll advance diet to soft and monitor. No nausea/vomiting.  2. Respiratory distress/shortness of breath-patient  feeling somewhat short of breath today. Not hypoxic although. Patient is noted to be significantly tachycardic with heart rates in the low 100's to 120s. EKG showing sinus tachycardia. -We'll get CT chest with contrast to rule out pulmonary embolism.  3. Hypokalemia-secondary to diarrhea. Much improved w/ supplementation.  - Mg. Level normal.   4. Essential hypertension-continue clonidine, Imdur - BP stable.   5. Diabetes type 2 without complication-continue sliding scale insulin, Tradjenta. Blood sugar stable. - will check a hemoglobin A1c, appreciated coordinator Input.   6. DM Neuropathy - cont. Lyrica.   7. GERD - cont. Protonix.   8. Depression/anxiety-continue Xanax, Lexapro.     All the records are reviewed and case discussed with Care Management/Social Worker. Management plans discussed with the patient, family and they are in agreement.  CODE STATUS: Full  DVT Prophylaxis: Heparin subcutaneous  TOTAL TIME TAKING CARE OF THIS PATIENT: 30 minutes.   POSSIBLE D/C IN 1-2 DAYS, DEPENDING ON CLINICAL CONDITION.   Henreitta Leber M.D on 02/24/2017 at 1:29 PM  Between 7am to 6pm - Pager - [phone]  After 6pm go to [email or web address] - password [email or web address] [facility]  Office  [phone]  CC: Primary care physician; Lavera Guise, MD

## 2017-02-25 LAB — GLUCOSE, CAPILLARY
GLUCOSE-CAPILLARY: 106 mg/dL — AB (ref 65–99)
Glucose-Capillary: 118 mg/dL — ABNORMAL HIGH (ref 65–99)
Glucose-Capillary: 149 mg/dL — ABNORMAL HIGH (ref 65–99)
Glucose-Capillary: 145 mg/dL — ABNORMAL HIGH (ref 65–99)

## 2017-02-25 LAB — HEMOGLOBIN A1C
Hgb A1c MFr Bld: 5.6 % (ref 4.8–5.6)
MEAN PLASMA GLUCOSE: 114 mg/dL

## 2017-02-25 NOTE — Progress Notes (Signed)
[facility] at [place] NAME: Meghan [name]    MR#:  [id number]  DATE OF BIRTH:  09-May-1946  SUBJECTIVE:   Shortness of breath improved since yesterday.  Still having some diarrhea and had 4 episodes this morning. Tachycardia has improved.  REVIEW OF SYSTEMS:    Review of Systems  Constitutional: Negative for chills and fever.  HENT: Negative for congestion and tinnitus.   Eyes: Negative for blurred vision and double vision.  Respiratory: Negative for cough, shortness of breath and wheezing.   Cardiovascular: Negative for chest pain, orthopnea and PND.  Gastrointestinal: Positive for abdominal pain and diarrhea. Negative for nausea and vomiting.  Genitourinary: Negative for dysuria and hematuria.  Neurological: Negative for dizziness, sensory change and focal weakness.  All other systems reviewed and are negative.   Nutrition: Soft diet Tolerating Diet: Yes Tolerating PT: Await Eval.   DRUG ALLERGIES:   Allergies  Allergen Reactions  . Gabapentin Other (See Comments)    Pt states that it causes her BP to drop.   . Naproxen Hives  . Nsaids Other (See Comments)    Reaction:  Unknown   . Tramadol Itching    VITALS:  Blood pressure (!) 94/59, pulse (!) 105, temperature 98.6 F (37 C), temperature source Oral, resp. rate 18, height 4\' 8"  (1.422 m), weight 58.5 kg (129 lb), SpO2 96 %.  PHYSICAL EXAMINATION:   Physical Exam  GENERAL:  71 y.o.-year-old patient lying in bed in   EYES: Pupils equal, round, reactive to light and accommodation. No scleral icterus. Extraocular muscles intact.  HEENT: Head atraumatic, normocephalic. Oropharynx and nasopharynx clear.  NECK:  Supple, no jugular venous distention. No thyroid enlargement, no tenderness.  LUNGS: Prolonged Insp. & exp. phase, no wheezing, rales, rhonchi. negative use of accessory muscles.   CARDIOVASCULAR: S1, S2 normal. No murmurs, rubs, or gallops.  ABDOMEN: Soft, nontender,  nondistended. Bowel sounds present. No organomegaly or mass.  EXTREMITIES: No cyanosis, clubbing or edema b/l.    NEUROLOGIC: Cranial nerves II through XII are intact. No focal Motor or sensory deficits b/l. Globally weak.   PSYCHIATRIC: The patient is alert and oriented x 3.  SKIN: No obvious rash, lesion, or ulcer.    LABORATORY PANEL:   CBC  Recent Labs Lab 02/23/17 0013  WBC 8.5  HGB 13.9  HCT 39.0  PLT 312   ------------------------------------------------------------------------------------------------------------------  Chemistries   Recent Labs Lab 02/22/17 1106  02/24/17 0458  NA 131*  < > 135  K 3.2*  < > 4.7  CL 95*  < > 110  CO2 23  < > 21*  GLUCOSE 238*  < > 129*  BUN 11  < > 7  CREATININE 0.90  < > 0.65  CALCIUM 8.3*  < > 7.7*  MG 1.5*  --  2.0  AST 32  --   --   ALT 12*  --   --   ALKPHOS 80  --   --   BILITOT 1.3*  --   --   < > = values in this interval not displayed. ------------------------------------------------------------------------------------------------------------------  Cardiac Enzymes  Recent Labs Lab 02/23/17 0013  TROPONINI <0.03   ------------------------------------------------------------------------------------------------------------------  RADIOLOGY:  Ct Angio Chest Pe W Or Wo Contrast  Result Date: 02/24/2017 CLINICAL DATA:  Dyspnea.  Inpatient.  C. diff colitis. EXAM: CT ANGIOGRAPHY CHEST WITH CONTRAST TECHNIQUE: Multidetector CT imaging of the chest was performed using the standard protocol during bolus administration of intravenous contrast. Multiplanar  CT image reconstructions and MIPs were obtained to evaluate the vascular anatomy. CONTRAST:  75 cc Isovue 370 IV. COMPARISON:  11/19/2011 chest CT. FINDINGS: Cardiovascular: The study is low to moderate quality for the evaluation of pulmonary embolism, with significantly limited evaluation of the segmental and subsegmental pulmonary arteries particularly at the lung bases  due to motion artifact. There are no filling defects in the central, lobar, segmental or subsegmental pulmonary artery branches to suggest acute pulmonary embolism. Atherosclerotic nonaneurysmal thoracic aorta. Normal caliber pulmonary arteries. Normal heart size. Left anterior descending and right coronary atherosclerosis. Mediastinum/Nodes: Subcentimeter hypodense posterior left thyroid lobe nodule. Unremarkable esophagus. No pathologically enlarged axillary, mediastinal or hilar lymph nodes. Lungs/Pleura: No pneumothorax. Small dependent bilateral pleural effusions, right greater than left, new since 02/22/2017 CT abdomen study. Tiny portions of the lung apices were inadvertently excluded from the study. Solid 3 mm peripheral right upper lobe pulmonary nodule (series 7/ image 29). Partially cavitary 1.5 x 0.7 cm posterior left lower lobe pulmonary nodule (series 7/ image 37), unchanged since 02/22/2017 CT abdomen study. Patchy ground-glass attenuation in the dependent basilar right upper lobe. Compressive atelectasis in the dependent right greater than left lower lobes. Biapical mild pleuroparenchymal scarring. Upper abdomen: Cholecystectomy. Small volume upper abdominal ascites is unchanged since the recent CT abdomen study. Musculoskeletal: No aggressive appearing focal osseous lesions. Marked thoracic spondylosis. Mild anasarca. Review of the MIP images confirms the above findings. IMPRESSION: 1. No evidence of pulmonary embolism, with limitations as described. 2. New small dependent bilateral pleural effusions, right greater than left. 3. Partially cavitary 1.5 cm posterior left lower lobe pulmonary nodule, unchanged since the CT abdomen study from 2 days prior. Primary lung malignancy not excluded. Management options include short term follow-up outpatient chest CT in 2-3 months and/or PET-CT evaluation. 4. Nonspecific patchy ground-glass opacity in the dependent basilar right upper lobe, compatible with a  nonspecific pneumonitis, with the differential including mild aspiration. 5. Two-vessel coronary atherosclerosis. Aortic Atherosclerosis (ICD10-I70.0). Electronically Signed   By: Ilona Sorrel M.D.   On: 02/24/2017 14:02     ASSESSMENT AND PLAN:   71 year old female with past medical history of collagen vascular disease, COPD, diabetes, hypertension, hyperlipidemia, osteoporosis, PTSD, history of brain tumor who presents to the hospital due to abdominal pain and diarrhea. Positive for C. Difficile.  1. C. difficile colitis-this is the cause of patient's abdominal pain and diarrhea. Patient CT scan was suggestive of infectious/inflammatory colitis. - cont. Bentyl, Continue oral vancomycin. Tolerating soft diet and improving.  - no N/V.   2. Respiratory distress/shortness of breath-improved, tachycardia is also improved. CT chest yesterday negative for pulmonary embolism.  -CT scan shows some pneumonitis which is probably related to patient's underlying rheumatoid arthritis. Will refer to pulmonary as an outpatient.  3. Hypokalemia-secondary to diarrhea. Much improved w/ supplementation.  - Mg. Level normal.   4. Essential hypertension-continue clonidine, Imdur - BP stable.   5. Diabetes type 2 without complication-continue sliding scale insulin, Tradjenta. Blood sugar stable. -A1c is 5.6. appreciated coordinator Input.   6. DM Neuropathy - cont. Lyrica.   7. GERD - cont. Protonix.   8. Depression/anxiety-continue Xanax, Lexapro.   Await PT eval.   All the records are reviewed and case discussed with Care Management/Social Worker. Management plans discussed with the patient, family and they are in agreement.  CODE STATUS: Full  DVT Prophylaxis: Heparin subcutaneous  TOTAL TIME TAKING CARE OF THIS PATIENT: 30 minutes.   POSSIBLE D/C IN 1-2 DAYS, DEPENDING ON CLINICAL CONDITION.  Henreitta Leber M.D on 02/25/2017 at 1:57 PM  Between 7am to 6pm - Pager -  [phone]  After 6pm go to [email or web address] - password [email or web address] [facility]  Office  [phone]  CC: Primary care physician; Lavera Guise, MD

## 2017-02-26 ENCOUNTER — Emergency Department: Payer: Medicare Other

## 2017-02-26 ENCOUNTER — Inpatient Hospital Stay
Admission: EM | Admit: 2017-02-26 | Discharge: 2017-03-04 | Disposition: A | Discharge status: Skilled Nursing Facility | Payer: Medicare Other | Source: Home / Self Care | Attending: Internal Medicine | Admitting: Internal Medicine

## 2017-02-26 ENCOUNTER — Encounter: Payer: Self-pay | Admitting: Emergency Medicine

## 2017-02-26 DIAGNOSIS — K219 Gastro-esophageal reflux disease without esophagitis: Secondary | ICD-10-CM

## 2017-02-26 DIAGNOSIS — Z79899 Other long term (current) drug therapy: Secondary | ICD-10-CM

## 2017-02-26 DIAGNOSIS — M810 Age-related osteoporosis without current pathological fracture: Secondary | ICD-10-CM | POA: Diagnosis present

## 2017-02-26 DIAGNOSIS — Z96653 Presence of artificial knee joint, bilateral: Secondary | ICD-10-CM | POA: Diagnosis present

## 2017-02-26 DIAGNOSIS — Y92009 Unspecified place in unspecified non-institutional (private) residence as the place of occurrence of the external cause: Secondary | ICD-10-CM

## 2017-02-26 DIAGNOSIS — E869 Volume depletion, unspecified: Secondary | ICD-10-CM | POA: Diagnosis not present

## 2017-02-26 DIAGNOSIS — R7989 Other specified abnormal findings of blood chemistry: Secondary | ICD-10-CM

## 2017-02-26 DIAGNOSIS — I110 Hypertensive heart disease with heart failure: Secondary | ICD-10-CM | POA: Diagnosis not present

## 2017-02-26 DIAGNOSIS — Z794 Long term (current) use of insulin: Secondary | ICD-10-CM

## 2017-02-26 DIAGNOSIS — E871 Hypo-osmolality and hyponatremia: Secondary | ICD-10-CM | POA: Diagnosis present

## 2017-02-26 DIAGNOSIS — F419 Anxiety disorder, unspecified: Secondary | ICD-10-CM

## 2017-02-26 DIAGNOSIS — Z8619 Personal history of other infectious and parasitic diseases: Secondary | ICD-10-CM

## 2017-02-26 DIAGNOSIS — J9601 Acute respiratory failure with hypoxia: Secondary | ICD-10-CM | POA: Diagnosis present

## 2017-02-26 DIAGNOSIS — E669 Obesity, unspecified: Secondary | ICD-10-CM | POA: Diagnosis present

## 2017-02-26 DIAGNOSIS — F4310 Post-traumatic stress disorder, unspecified: Secondary | ICD-10-CM

## 2017-02-26 DIAGNOSIS — R740 Nonspecific elevation of levels of transaminase and lactic acid dehydrogenase [LDH]: Secondary | ICD-10-CM | POA: Diagnosis not present

## 2017-02-26 DIAGNOSIS — Z6832 Body mass index (BMI) 32.0-32.9, adult: Secondary | ICD-10-CM

## 2017-02-26 DIAGNOSIS — W010XXA Fall on same level from slipping, tripping and stumbling without subsequent striking against object, initial encounter: Secondary | ICD-10-CM | POA: Diagnosis present

## 2017-02-26 DIAGNOSIS — R55 Syncope and collapse: Secondary | ICD-10-CM | POA: Diagnosis present

## 2017-02-26 DIAGNOSIS — I5032 Chronic diastolic (congestive) heart failure: Secondary | ICD-10-CM | POA: Diagnosis present

## 2017-02-26 DIAGNOSIS — E860 Dehydration: Secondary | ICD-10-CM | POA: Diagnosis present

## 2017-02-26 DIAGNOSIS — Z886 Allergy status to analgesic agent status: Secondary | ICD-10-CM

## 2017-02-26 DIAGNOSIS — Z888 Allergy status to other drugs, medicaments and biological substances status: Secondary | ICD-10-CM

## 2017-02-26 DIAGNOSIS — I2510 Atherosclerotic heart disease of native coronary artery without angina pectoris: Secondary | ICD-10-CM

## 2017-02-26 DIAGNOSIS — Z66 Do not resuscitate: Secondary | ICD-10-CM

## 2017-02-26 DIAGNOSIS — J189 Pneumonia, unspecified organism: Secondary | ICD-10-CM

## 2017-02-26 DIAGNOSIS — Z981 Arthrodesis status: Secondary | ICD-10-CM

## 2017-02-26 DIAGNOSIS — A0472 Enterocolitis due to Clostridium difficile, not specified as recurrent: Principal | ICD-10-CM

## 2017-02-26 DIAGNOSIS — E1140 Type 2 diabetes mellitus with diabetic neuropathy, unspecified: Secondary | ICD-10-CM

## 2017-02-26 DIAGNOSIS — B370 Candidal stomatitis: Secondary | ICD-10-CM

## 2017-02-26 DIAGNOSIS — R000 Tachycardia, unspecified: Secondary | ICD-10-CM | POA: Diagnosis not present

## 2017-02-26 DIAGNOSIS — F329 Major depressive disorder, single episode, unspecified: Secondary | ICD-10-CM

## 2017-02-26 DIAGNOSIS — E785 Hyperlipidemia, unspecified: Secondary | ICD-10-CM | POA: Diagnosis present

## 2017-02-26 DIAGNOSIS — R4182 Altered mental status, unspecified: Secondary | ICD-10-CM

## 2017-02-26 DIAGNOSIS — J440 Chronic obstructive pulmonary disease with acute lower respiratory infection: Secondary | ICD-10-CM

## 2017-02-26 DIAGNOSIS — R0602 Shortness of breath: Secondary | ICD-10-CM

## 2017-02-26 DIAGNOSIS — Z8249 Family history of ischemic heart disease and other diseases of the circulatory system: Secondary | ICD-10-CM

## 2017-02-26 LAB — CBC
HEMATOCRIT: 34.4 % — AB (ref 35.0–47.0)
HEMOGLOBIN: 11.7 g/dL — AB (ref 12.0–16.0)
MCH: 30.4 pg (ref 26.0–34.0)
MCHC: 34.1 g/dL (ref 32.0–36.0)
MCV: 89.2 fL (ref 80.0–100.0)
Platelets: 247 10*3/uL (ref 150–440)
RBC: 3.85 MIL/uL (ref 3.80–5.20)
RDW: 15 % — ABNORMAL HIGH (ref 11.5–14.5)
WBC: 6.2 10*3/uL (ref 3.6–11.0)

## 2017-02-26 LAB — GLUCOSE, CAPILLARY
GLUCOSE-CAPILLARY: 208 mg/dL — AB (ref 65–99)
Glucose-Capillary: 98 mg/dL (ref 65–99)
Glucose-Capillary: 117 mg/dL — ABNORMAL HIGH (ref 65–99)

## 2017-02-26 LAB — LACTIC ACID, PLASMA: LACTIC ACID, VENOUS: 2.9 mmol/L — AB (ref 0.5–1.9)

## 2017-02-26 LAB — BASIC METABOLIC PANEL
ANION GAP: 9 (ref 5–15)
BUN: 10 mg/dL (ref 6–20)
CO2: 21 mmol/L — ABNORMAL LOW (ref 22–32)
Calcium: 8.4 mg/dL — ABNORMAL LOW (ref 8.9–10.3)
Chloride: 103 mmol/L (ref 101–111)
Creatinine, Ser: 0.64 mg/dL (ref 0.44–1.00)
GFR calc Af Amer: >60 mL/min (ref >60)
Glucose, Bld: 154 mg/dL — ABNORMAL HIGH (ref 65–99)
POTASSIUM: 4.1 mmol/L (ref 3.5–5.1)
SODIUM: 133 mmol/L — AB (ref 135–145)

## 2017-02-26 LAB — TROPONIN I: Troponin I: <0.03 ng/mL (ref <0.03)

## 2017-02-26 MED ORDER — SODIUM CHLORIDE 0.9 % IV BOLUS (SEPSIS)
500.0000 mL | INTRAVENOUS | Status: AC
  Administered 2017-02-26: 500 mL via INTRAVENOUS

## 2017-02-26 MED ORDER — VANCOMYCIN 50 MG/ML ORAL SOLUTION: 125.0000 mg | Freq: Four times a day (QID) | ORAL | 0 refills | Status: DC

## 2017-02-26 NOTE — Care Management Important Message (Signed)
Important Message  Patient Details  Name: Meghan [name] MRN: [id number] Date of Birth: 09-20-1945   Medicare Important Message Given:  Yes    Jolly Mango, RN 02/26/2017, 9:07 AM

## 2017-02-26 NOTE — Care Management Note (Signed)
Case Management Note  Patient Details  Name: Meghan [name] MRN: [id number] Date of Birth: 12/07/1945  Subjective/Objective:   Plan is home today. PT recommending home health PT. Would benefit from a nurse and sw. Patient states she has difficulty paying for her bills. Offered choice of home health agencies. [facility]erral to [facility] for SN, PT and SW. Patient lives alone. She can drive and drives to appointments but not often only when necessary. She uses a walker. Can provide her own adls. Refused a HHA.                  Action/Plan: Home today with [facility] and SN, PT , SW.   Expected Discharge Date:  02/24/17               Expected Discharge Plan:  [place]  In-House Referral:     Discharge planning Services  CM Consult  Post Acute Care Choice:  Home Health Choice offered to:  Patient  DME Arranged:    DME Agency:     HH Arranged:  PT, Social Work [employer] Agency:  [facility]  Status of Service:  Completed, signed off  If discussed at [employer], dates discussed:    Additional Comments:  Jolly Mango, RN 02/26/2017, 12:41 PM

## 2017-02-26 NOTE — Evaluation (Signed)
Physical Therapy Evaluation Patient Details Name: Meghan [name] MRN: [id number] DOB: [birth year] Today's Date: 02/26/2017   History of Present Illness  Pt is a 71 year old female with past medical history of collagen vascular disease, COPD, diabetes, hypertension, hyperlipidemia, osteoporosis, PTSD, history of brain tumor who presents to the hospital due to abdominal pain and diarrhea. Positive for C. Difficile.  Assessment includes: C-difficile colitis, respiratory distress/SOB, hypokalemia, HTN, DM, and depression/anxiety.     Clinical Impression  Pt presents with mild deficits in strength, transfers, mobility, gait, and balance and more moderate deficits in activity tolerance.  Pt required only slight assistance with sit to sup to get BLEs onto edge of bed and SBA with sup to sit with extra time and effort required.  Pt SBA with transfers with good stability upon initial stand.  Pt able to amb in room 50' with RW and SBA with good stability before requiring seated rest break.  HR up from baseline near 100 bpm to 116 bpm after amb with SpO2 WNL.  Pt will benefit from HHPT services to address above deficits for decreased caregiver assistance upon discharge and return to PLOF.  Pt will also benefit from 24/hr supervision at least initially to ensure safe transition back to independent living.  Pt reports may have family or friend that could assist if needed for a day or two upon discharge.       Follow Up Recommendations Home health PT;Supervision/Assistance - 24 hour    Equipment Recommendations  None recommended by PT    Recommendations for Other Services       Precautions / Restrictions Precautions Precautions: Fall Restrictions Weight Bearing Restrictions: No      Mobility  Bed Mobility Overal bed mobility: Needs Assistance Bed Mobility: Supine to Sit;Sit to Supine     Supine to sit: Supervision Sit to supine: Min assist   General bed mobility comments: Min assistance for BLEs  up to EOB during sit to supine  Transfers Overall transfer level: Needs assistance Equipment used: Rolling walker (2 wheeled) Transfers: Sit to/from Stand Sit to Stand: Supervision         General transfer comment: Good effort with transfer and steady upon initial stand  Ambulation/Gait Ambulation/Gait assistance: Supervision Ambulation Distance (Feet): 50 Feet Assistive device: Rolling walker (2 wheeled) Gait Pattern/deviations: Step-through pattern;Decreased step length - right;Decreased step length - left   Gait velocity interpretation: Below normal speed for age/gender General Gait Details: Amb in room only secondary to enteric precautions with good stability but limited endurance.  HR up from baseline near 100 to 116 after amb with SpO2 WNL.  Stairs            Wheelchair Mobility    Modified Rankin (Stroke Patients Only)       Balance Overall balance assessment: Needs assistance Sitting-balance support: Feet supported;No upper extremity supported Sitting balance-Leahy Scale: Normal     Standing balance support: No upper extremity supported Standing balance-Leahy Scale: Good                               Pertinent Vitals/Pain Pain Assessment: No/denies pain    Home Living Family/patient expects to be discharged to:: Private residence Living Arrangements: Alone Available Help at Discharge: Family;Available PRN/intermittently;Neighbor Type of Home: Apartment Home Access: Stairs to enter Entrance Stairs-Rails: Right;Left;Can reach both Entrance Stairs-Number of Steps: 2 Home Layout: One level Home Equipment: Walker - 2 wheels      Prior  Function Level of Independence: Independent         Comments: Pt Ind with amb without AD community distances, no fall history, Ind with all ADLs     Hand Dominance   Dominant Hand: Right    Extremity/Trunk Assessment        Lower Extremity Assessment Lower Extremity Assessment: Generalized  weakness       Communication   Communication: No difficulties  Cognition Arousal/Alertness: Awake/alert Behavior During Therapy: WFL for tasks assessed/performed Overall Cognitive Status: Within Functional Limits for tasks assessed                                        General Comments      Exercises Total Joint Exercises Ankle Circles/Pumps: AROM;Both;10 reps Quad Sets: Strengthening;Both;10 reps Gluteal Sets: Strengthening;Both;10 reps Hip ABduction/ADduction: AROM;Both;5 reps Straight Leg Raises: AROM;Both;5 reps Long Arc Quad: AROM;Both;10 reps;15 reps Marching in Standing: AROM;Both;10 reps   Assessment/Plan    PT Assessment Patient needs continued PT services  PT Problem List Decreased strength;Decreased activity tolerance;Decreased balance       PT Treatment Interventions      PT Goals (Current goals can be found in the Care Plan section)  Acute Rehab PT Goals Patient Stated Goal: To get stronger and to be able to do more without feeling tired PT Goal Formulation: With patient Time For Goal Achievement: 03/11/17 Potential to Achieve Goals: Good    Frequency Min 2X/week   Barriers to discharge        Co-evaluation               AM-PAC PT "6 Clicks" Daily Activity  Outcome Measure Difficulty turning over in bed (including adjusting bedclothes, sheets and blankets)?: Total Difficulty moving from lying on back to sitting on the side of the bed? : A Little Difficulty sitting down on and standing up from a chair with arms (e.g., wheelchair, bedside commode, etc,.)?: None Help needed moving to and from a bed to chair (including a wheelchair)?: A Little Help needed walking in hospital room?: A Little Help needed climbing 3-5 steps with a railing? : A Lot 6 Click Score: 16    End of Session Equipment Utilized During Treatment: Gait belt Activity Tolerance: Patient limited by fatigue Patient left: in bed;with bed alarm set;with call  bell/phone within reach Nurse Communication: Mobility status PT Visit Diagnosis: Muscle weakness (generalized) (M62.81);Difficulty in walking, not elsewhere classified (R26.2)    Time: 0102-7253 PT Time Calculation (min) (ACUTE ONLY): 29 min   Charges:   PT Evaluation $PT Eval Low Complexity: 1 Procedure PT Treatments $Therapeutic Exercise: 8-22 mins   PT G Codes:        DRoyetta Asal PT, DPT 02/26/17, 12:03 PM

## 2017-02-26 NOTE — Discharge Planning (Signed)
Patient orders complete and ready for discharge to home with [facility].  Pharm also providing remainder of Vanc solution. However, per patient, her ride/daughter is working till  9pm or so and so she won;t be able to get here till 10pm.  Also stated, even if she's able to get home, the daughter has her home key.  Patient will be discharged once ride arrives.

## 2017-02-26 NOTE — ED Notes (Signed)
Pt placed on enteric c diff precautions.

## 2017-02-26 NOTE — Discharge Planning (Signed)
Patient discharge papers given, explained and educated.  RN assessment and VS revealed stability for DC to home with HH (at this time).  No need for FU appt at this time, but patient instructed to contact PCP as needed, No scripts needed at this time. Remaining doses of PO Vanc solutions given to take at home.  Once ride arrives (now about 9pm), patient will be wheeled to front and friend transporting home via car.

## 2017-02-26 NOTE — ED Triage Notes (Signed)
Pt from home, discharged today from floor for colitis. Pt states she was placing her purse on floor after getting home when she fell. Pt states after falling she has felt shob. Pt with history of dyspnea with exertion. Pt denies pain or loc.

## 2017-02-26 NOTE — Discharge Summary (Signed)
[place] [facility] [place] NAME: Meghan [name]    MR#:  [id number]  DATE OF BIRTH:  [birth year]  DATE OF ADMISSION:  02/22/2017 ADMITTING PHYSICIAN: Vaughan Basta, MD  DATE OF DISCHARGE: 02/26/2017  PRIMARY CARE PHYSICIAN: Lavera Guise, MD    ADMISSION DIAGNOSIS:  Colitis [K52.9] Nausea [R11.0] Generalized abdominal pain [R10.84] Elevated troponin [R74.8] Diarrhea, unspecified type [R19.7]  DISCHARGE DIAGNOSIS:  Principal Problem:   Colitis   SECONDARY DIAGNOSIS:   Past Medical History:  Diagnosis Date  . Anginal pain ([place])   . Anxiety   . Arthritis    RA  . Asthma   . Brain tumor (benign) ([place])   . CHF (congestive heart failure) ([place])   . Collagen vascular disease ([place])   . COPD (chronic obstructive pulmonary disease) ([place])   . Coronary artery disease   . DDD (degenerative disc disease)   . Depression   . Diabetes mellitus   . GERD (gastroesophageal reflux disease)   . Headache   . Heart murmur   . Heart murmur   . Hypercholesteremia   . Hypertension   . Lumbar degenerative disc disease   . Migraines   . Obese   . Obesity   . Osteopenia   . Osteoporosis   . Pneumonia   . PTSD (post-traumatic stress disorder)     HOSPITAL COURSE:   71 year old female with past medical history of collagen vascular disease, COPD, diabetes, hypertension, hyperlipidemia, osteoporosis, PTSD, history of brain tumor who presents to the hospital due to abdominal pain and diarrhea. Positive for C. Difficile.  1. C. difficile colitis-this was the cause of patient's abdominal pain and diarrhea. Patient CT scan was suggestive of infectious/inflammatory colitis. -Patient was started on oral vancomycin and tolerated well. I diarrhea has improved. She has no abdominal pain now. She is tolerating a soft diet well. She denies any nausea vomiting. She is being discharged home on 10 more days of oral vancomycin.  2. Respiratory distress/shortness  of breath-improved, tachycardia has also improved. CT chest while in the hospital showed no acute PE but showed some pneumonitis which is probably related to patient's underlying rheumatoid arthritis.  - pt. Will need referral to Pulmonary as outpatient.   3. Hypokalemia-secondary to diarrhea. Much improved and normalized with supplementation.   4. Essential hypertension- pt. Will continue her clonidine, Imdur - BP stable.   5. Diabetes type 2 without complication- BS remained stable while in the hospital and pt. Will cont. Her Tradjenta and Insulin upon discharge.   6. DM Neuropathy - she will continue Lyrica.   7. Depression/anxiety- she will continue Xanax, Lexapro.  Patient is being discharged home with home health physical therapy, nursing and social work.  DISCHARGE CONDITIONS:   Stable  CONSULTS OBTAINED:    DRUG ALLERGIES:   Allergies  Allergen Reactions  . Gabapentin Other (See Comments)    Pt states that it causes her BP to drop.   . Naproxen Hives  . Nsaids Other (See Comments)    Reaction:  Unknown   . Tramadol Itching    DISCHARGE MEDICATIONS:   Allergies as of 02/26/2017      Reactions   Gabapentin Other (See Comments)   Pt states that it causes her BP to drop.    Naproxen Hives   Nsaids Other (See Comments)   Reaction:  Unknown    Tramadol Itching      Medication List    STOP taking these medications  acyclovir ointment 5 % Commonly known as:  ZOVIRAX   ALPRAZolam 0.25 MG tablet Commonly known as:  XANAX   azithromycin 250 MG tablet Commonly known as:  ZITHROMAX   sulfamethoxazole-trimethoprim 800-160 MG tablet Commonly known as:  BACTRIM DS,SEPTRA DS     TAKE these medications   albuterol 108 (90 Base) MCG/ACT inhaler Commonly known as:  PROVENTIL HFA;VENTOLIN HFA Inhale 2 puffs into the lungs every 6 (six) hours as needed for wheezing or shortness of breath.   cloNIDine 0.1 MG tablet Commonly known as:  CATAPRES Take 0.1 mg  by mouth 2 (two) times daily.   diphenoxylate-atropine 2.5-0.025 MG tablet Commonly known as:  LOMOTIL Take 1 tablet by mouth 3 (three) times daily as needed for diarrhea or loose stools.   escitalopram 10 MG tablet Commonly known as:  LEXAPRO Take 1 tablet (10 mg total) by mouth every morning.   esomeprazole 40 MG capsule Commonly known as:  NEXIUM Take 40 mg by mouth at bedtime.   insulin starter kit- pen needles Misc 1 kit by Other route once.   isosorbide mononitrate 30 MG 24 hr tablet Commonly known as:  IMDUR Take 30 mg by mouth daily.   lamoTRIgine 100 MG tablet Commonly known as:  LAMICTAL Take 1 tablet (100 mg total) by mouth daily.   LYRICA 50 MG capsule Generic drug:  pregabalin TAKE ONE CAPSULE BY MOUTH TWICE A DAY   meloxicam 7.5 MG tablet Commonly known as:  MOBIC daily.   montelukast 10 MG tablet Commonly known as:  SINGULAIR Take 10 mg by mouth daily.   nortriptyline 10 MG capsule Commonly known as:  PAMELOR Take 10 mg by mouth at bedtime.   ondansetron 4 MG tablet Commonly known as:  ZOFRAN Take 4 mg by mouth 3 (three) times daily.   ONE TOUCH ULTRA TEST test strip Generic drug:  glucose blood   ONGLYZA 5 MG Tabs tablet Generic drug:  saxagliptin HCl Take 5 mg by mouth at bedtime.   pramipexole 0.25 MG tablet Commonly known as:  MIRAPEX Take 0.25 mg by mouth.   predniSONE 5 MG tablet Commonly known as:  DELTASONE Take 5 mg by mouth daily.   Syringe (Disposable) 1 ML Misc 300 Syringes by Does not apply route daily.   topiramate 25 MG tablet Commonly known as:  TOPAMAX Take 25 mg by mouth 2 (two) times daily.   vancomycin 50 mg/mL oral solution Commonly known as:  VANCOCIN Take 2.5 mLs (125 mg total) by mouth every 6 (six) hours.         DISCHARGE INSTRUCTIONS:   DIET:  Cardiac diet and Diabetic diet  DISCHARGE CONDITION:  Stable  ACTIVITY:  Activity as tolerated  OXYGEN:  Home Oxygen: No.   Oxygen Delivery: room  air  DISCHARGE LOCATION:  Home with home health nursing, physical therapy, social work.   If you experience worsening of your admission symptoms, develop shortness of breath, life threatening emergency, suicidal or homicidal thoughts you must seek medical attention immediately by calling 911 or calling your MD immediately  if symptoms less severe.  You Must read complete instructions/literature along with all the possible adverse reactions/side effects for all the Medicines you take and that have been prescribed to you. Take any new Medicines after you have completely understood and accpet all the possible adverse reactions/side effects.   Please note  You were cared for by a hospitalist during your hospital stay. If you have any questions about your discharge medications or  the care you received while you were in the hospital after you are discharged, you can call the unit and asked to speak with the hospitalist on call if the hospitalist that took care of you is not available. Once you are discharged, your primary care physician will handle any further medical issues. Please note that NO REFILLS for any discharge medications will be authorized once you are discharged, as it is imperative that you return to your primary care physician (or establish a relationship with a primary care physician if you do not have one) for your aftercare needs so that they can reassess your need for medications and monitor your lab values.     Today   Diarrhea has improved. Tolerating by mouth well. No nausea or vomiting. Ambulated well with physical therapy and they recommended home health services.  VITAL SIGNS:  Blood pressure 115/64, pulse (!) 102, temperature (!) 97.4 F (36.3 C), temperature source Oral, resp. rate 18, height _0  (1.422 m), weight 58.5 kg (129 lb), SpO2 99 %.  I/O:    Intake/Output Summary (Last 24 hours) at 02/26/17 1411 Last data filed at 02/26/17 0500  Gross per 24 hour  Intake               240 ml  Output                0 ml  Net              240 ml    PHYSICAL EXAMINATION:   GENERAL:  71 y.o.-year-old patient lying in bed in   EYES: Pupils equal, round, reactive to light and accommodation. No scleral icterus. Extraocular muscles intact.  HEENT: Head atraumatic, normocephalic. Oropharynx and nasopharynx clear.  NECK:  Supple, no jugular venous distention. No thyroid enlargement, no tenderness.  LUNGS: Prolonged Insp. & exp. phase, no wheezing, rales, rhonchi. negative use of accessory muscles.   CARDIOVASCULAR: S1, S2 normal. No murmurs, rubs, or gallops.  ABDOMEN: Soft, nontender, nondistended. Bowel sounds present. No organomegaly or mass.  EXTREMITIES: No cyanosis, clubbing or edema b/l.    NEUROLOGIC: Cranial nerves II through XII are intact. No focal Motor or sensory deficits b/l. Globally weak.   PSYCHIATRIC: The patient is alert and oriented x 3.  SKIN: No obvious rash, lesion, or ulcer.    DATA REVIEW:   CBC  Recent Labs Lab 02/23/17 0013  WBC 8.5  HGB 13.9  HCT 39.0  PLT 312    Chemistries   Recent Labs Lab 02/22/17 1106  02/24/17 0458  NA 131*  < > 135  K 3.2*  < > 4.7  CL 95*  < > 110  CO2 23  < > 21*  GLUCOSE 238*  < > 129*  BUN 11  < > 7  CREATININE 0.90  < > 0.65  CALCIUM 8.3*  < > 7.7*  MG 1.5*  --  2.0  AST 32  --   --   ALT 12*  --   --   ALKPHOS 80  --   --   BILITOT 1.3*  --   --   < > = values in this interval not displayed.  Cardiac Enzymes  Recent Labs Lab 02/23/17 0013  TROPONINI <0.03    Microbiology Results  Results for orders placed or performed during the hospital encounter of 02/22/17  Gastrointestinal Panel by PCR , Stool     Status: None   Collection Time: 02/22/17  1:13 PM  Result Value  Ref Range Status   Campylobacter species NOT DETECTED NOT DETECTED Final   Plesimonas shigelloides NOT DETECTED NOT DETECTED Final   Salmonella species NOT DETECTED NOT DETECTED Final   Yersinia  enterocolitica NOT DETECTED NOT DETECTED Final   Vibrio species NOT DETECTED NOT DETECTED Final   Vibrio cholerae NOT DETECTED NOT DETECTED Final   Enteroaggregative E coli (EAEC) NOT DETECTED NOT DETECTED Final   Enteropathogenic E coli (EPEC) NOT DETECTED NOT DETECTED Final   Enterotoxigenic E coli (ETEC) NOT DETECTED NOT DETECTED Final   Shiga like toxin producing E coli (STEC) NOT DETECTED NOT DETECTED Final   Shigella/Enteroinvasive E coli (EIEC) NOT DETECTED NOT DETECTED Final   Cryptosporidium NOT DETECTED NOT DETECTED Final   Cyclospora cayetanensis NOT DETECTED NOT DETECTED Final   Entamoeba histolytica NOT DETECTED NOT DETECTED Final   Giardia lamblia NOT DETECTED NOT DETECTED Final   Adenovirus F40/41 NOT DETECTED NOT DETECTED Final   Astrovirus NOT DETECTED NOT DETECTED Final   Norovirus GI/GII NOT DETECTED NOT DETECTED Final   Rotavirus A NOT DETECTED NOT DETECTED Final   Sapovirus (I, II, IV, and V) NOT DETECTED NOT DETECTED Final  Blood Culture (routine x 2)     Status: None (Preliminary result)   Collection Time: 02/22/17  1:13 PM  Result Value Ref Range Status   Specimen Description BLOOD LEFT ANTECUBITAL  Final   Special Requests   Final    BOTTLES DRAWN AEROBIC AND ANAEROBIC Blood Culture adequate volume   Culture NO GROWTH 4 DAYS  Final   Report Status PENDING  Incomplete  C difficile quick scan w PCR reflex     Status: Abnormal   Collection Time: 02/22/17  1:13 PM  Result Value Ref Range Status   C Diff antigen POSITIVE (A) NEGATIVE Final   C Diff toxin POSITIVE (A) NEGATIVE Final   C Diff interpretation Toxin producing C. difficile detected.  Final    Comment: CRITICAL RESULT CALLED TO, READ BACK BY AND VERIFIED WITH: BRANDY FARRELL AT 0277 ON 02/22/2017 JJB   Blood Culture (routine x 2)     Status: None (Preliminary result)   Collection Time: 02/22/17  1:41 PM  Result Value Ref Range Status   Specimen Description BLOOD BLOOD RIGHT FOREARM  Final   Special  Requests   Final    BOTTLES DRAWN AEROBIC AND ANAEROBIC Blood Culture adequate volume   Culture NO GROWTH 4 DAYS  Final   Report Status PENDING  Incomplete    RADIOLOGY:  No results found.    Management plans discussed with the patient, family and they are in agreement.  CODE STATUS:     Code Status Orders        Start     Ordered   02/22/17 1548  Full code  Continuous     02/22/17 1547      TOTAL TIME TAKING CARE OF THIS PATIENT: 40 minutes.    Henreitta Leber M.D on 02/26/2017 at 2:11 PM  Between 7am to 6pm - Pager - [phone]  After 6pm go to [email or web address] - password [email or web address] [place] [facility]  Office  [phone]  CC: Primary care physician; Lavera Guise, MD

## 2017-02-26 NOTE — ED Notes (Addendum)
Po fluids provided with md consent.

## 2017-02-27 ENCOUNTER — Encounter: Payer: Self-pay | Admitting: Internal Medicine

## 2017-02-27 DIAGNOSIS — E860 Dehydration: Secondary | ICD-10-CM | POA: Diagnosis present

## 2017-02-27 DIAGNOSIS — R55 Syncope and collapse: Secondary | ICD-10-CM | POA: Diagnosis present

## 2017-02-27 LAB — URINALYSIS, COMPLETE (UACMP) WITH MICROSCOPIC
BACTERIA UA: NONE SEEN
BILIRUBIN URINE: NEGATIVE
Glucose, UA: NEGATIVE mg/dL
Hgb urine dipstick: NEGATIVE
Ketones, ur: NEGATIVE mg/dL
LEUKOCYTES UA: NEGATIVE
Nitrite: NEGATIVE
PH: 7 (ref 5.0–8.0)
Protein, ur: 30 mg/dL — AB
SPECIFIC GRAVITY, URINE: 1.026 (ref 1.005–1.030)
SQUAMOUS EPITHELIAL / LPF: NONE SEEN

## 2017-02-27 LAB — BASIC METABOLIC PANEL
Anion gap: 7 (ref 5–15)
BUN: 9 mg/dL (ref 6–20)
CO2: 24 mmol/L (ref 22–32)
CREATININE: 0.58 mg/dL (ref 0.44–1.00)
Calcium: 8.3 mg/dL — ABNORMAL LOW (ref 8.9–10.3)
Chloride: 105 mmol/L (ref 101–111)
Glucose, Bld: 144 mg/dL — ABNORMAL HIGH (ref 65–99)
Potassium: 3.8 mmol/L (ref 3.5–5.1)
SODIUM: 136 mmol/L (ref 135–145)

## 2017-02-27 LAB — CBC
HEMATOCRIT: 32.5 % — AB (ref 35.0–47.0)
Hemoglobin: 11.1 g/dL — ABNORMAL LOW (ref 12.0–16.0)
MCH: 30.5 pg (ref 26.0–34.0)
MCHC: 34.1 g/dL (ref 32.0–36.0)
MCV: 89.5 fL (ref 80.0–100.0)
PLATELETS: 235 10*3/uL (ref 150–440)
RBC: 3.64 MIL/uL — AB (ref 3.80–5.20)
RDW: 14.8 % — AB (ref 11.5–14.5)
WBC: 5.1 10*3/uL (ref 3.6–11.0)

## 2017-02-27 LAB — HEPATIC FUNCTION PANEL
ALT: 18 U/L (ref 14–54)
AST: 40 U/L (ref 15–41)
Albumin: 2.4 g/dL — ABNORMAL LOW (ref 3.5–5.0)
Alkaline Phosphatase: 76 U/L (ref 38–126)
BILIRUBIN DIRECT: 0.2 mg/dL (ref 0.1–0.5)
BILIRUBIN INDIRECT: 0.5 mg/dL (ref 0.3–0.9)
Total Bilirubin: 0.7 mg/dL (ref 0.3–1.2)
Total Protein: 5.2 g/dL — ABNORMAL LOW (ref 6.5–8.1)

## 2017-02-27 LAB — GLUCOSE, CAPILLARY
GLUCOSE-CAPILLARY: 174 mg/dL — AB (ref 65–99)
GLUCOSE-CAPILLARY: 185 mg/dL — AB (ref 65–99)

## 2017-02-27 LAB — CULTURE, BLOOD (ROUTINE X 2)
CULTURE: NO GROWTH
Culture: NO GROWTH
SPECIAL REQUESTS: ADEQUATE
SPECIAL REQUESTS: ADEQUATE

## 2017-02-27 LAB — TROPONIN I: Troponin I: <0.03 ng/mL (ref <0.03)

## 2017-02-27 LAB — LIPASE, BLOOD: LIPASE: 42 U/L (ref 11–51)

## 2017-02-27 LAB — LACTIC ACID, PLASMA: LACTIC ACID, VENOUS: 1.8 mmol/L (ref 0.5–1.9)

## 2017-02-27 MED ORDER — LAMOTRIGINE 100 MG PO TABS
100.0000 mg | ORAL_TABLET | Freq: Every day | ORAL | Status: DC
  Administered 2017-02-27 – 2017-03-04 (×6): 100 mg via ORAL
  Filled 2017-02-27 (×7): qty 1

## 2017-02-27 MED ORDER — IPRATROPIUM-ALBUTEROL 0.5-2.5 (3) MG/3ML IN SOLN
3.0000 mL | Freq: Four times a day (QID) | RESPIRATORY_TRACT | Status: DC
  Administered 2017-02-27 – 2017-02-28 (×5): 3 mL via RESPIRATORY_TRACT
  Filled 2017-02-27 (×3): qty 3

## 2017-02-27 MED ORDER — IPRATROPIUM-ALBUTEROL 0.5-2.5 (3) MG/3ML IN SOLN
3.0000 mL | Freq: Once | RESPIRATORY_TRACT | Status: AC
  Administered 2017-02-27: 3 mL via RESPIRATORY_TRACT

## 2017-02-27 MED ORDER — IPRATROPIUM-ALBUTEROL 0.5-2.5 (3) MG/3ML IN SOLN
3.0000 mL | Freq: Four times a day (QID) | RESPIRATORY_TRACT | Status: DC
  Administered 2017-02-27: 3 mL via RESPIRATORY_TRACT
  Filled 2017-02-27 (×2): qty 3

## 2017-02-27 MED ORDER — METHYLPREDNISOLONE SODIUM SUCC 40 MG IJ SOLR
40.0000 mg | Freq: Two times a day (BID) | INTRAMUSCULAR | Status: DC
  Administered 2017-02-27 – 2017-03-03 (×9): 40 mg via INTRAVENOUS
  Filled 2017-02-27 (×10): qty 1

## 2017-02-27 MED ORDER — PRAMIPEXOLE DIHYDROCHLORIDE 0.25 MG PO TABS
0.2500 mg | ORAL_TABLET | Freq: Three times a day (TID) | ORAL | Status: DC
  Administered 2017-02-27 – 2017-03-04 (×16): 0.25 mg via ORAL
  Filled 2017-02-27 (×16): qty 1

## 2017-02-27 MED ORDER — BUDESONIDE 0.5 MG/2ML IN SUSP
0.5000 mg | Freq: Two times a day (BID) | RESPIRATORY_TRACT | Status: DC
  Administered 2017-02-27 – 2017-03-04 (×10): 0.5 mg via RESPIRATORY_TRACT
  Filled 2017-02-27 (×10): qty 2

## 2017-02-27 MED ORDER — ONDANSETRON HCL 4 MG/2ML IJ SOLN
4.0000 mg | Freq: Four times a day (QID) | INTRAMUSCULAR | Status: DC | PRN
Start: 2017-02-27 — End: 2017-03-04

## 2017-02-27 MED ORDER — INSULIN ASPART 100 UNIT/ML ~~LOC~~ SOLN: 0.0000 [IU] | Freq: Every day | SUBCUTANEOUS | Status: DC

## 2017-02-27 MED ORDER — PANTOPRAZOLE SODIUM 40 MG PO TBEC
40.0000 mg | DELAYED_RELEASE_TABLET | Freq: Every day | ORAL | Status: DC
  Administered 2017-02-27 – 2017-03-04 (×6): 40 mg via ORAL
  Filled 2017-02-27 (×6): qty 1

## 2017-02-27 MED ORDER — INSULIN ASPART 100 UNIT/ML ~~LOC~~ SOLN
0.0000 [IU] | Freq: Three times a day (TID) | SUBCUTANEOUS | Status: DC
  Administered 2017-02-27 – 2017-02-28 (×2): 2 [IU] via SUBCUTANEOUS
  Administered 2017-02-28 – 2017-03-03 (×8): 1 [IU] via SUBCUTANEOUS
  Filled 2017-02-27 (×11): qty 1

## 2017-02-27 MED ORDER — SODIUM CHLORIDE 0.9 % IV SOLN
INTRAVENOUS | Status: DC
  Administered 2017-02-27: 07:00:00 via INTRAVENOUS

## 2017-02-27 MED ORDER — ENOXAPARIN SODIUM 40 MG/0.4ML ~~LOC~~ SOLN
40.0000 mg | SUBCUTANEOUS | Status: DC
  Administered 2017-02-27 – 2017-03-04 (×6): 40 mg via SUBCUTANEOUS
  Filled 2017-02-27 (×6): qty 0.4

## 2017-02-27 MED ORDER — ONDANSETRON HCL 4 MG PO TABS: 4.0000 mg | ORAL_TABLET | Freq: Four times a day (QID) | ORAL | Status: DC | PRN

## 2017-02-27 MED ORDER — MONTELUKAST SODIUM 10 MG PO TABS
10.0000 mg | ORAL_TABLET | Freq: Every day | ORAL | Status: DC
  Administered 2017-02-27 – 2017-03-04 (×6): 10 mg via ORAL
  Filled 2017-02-27 (×6): qty 1

## 2017-02-27 MED ORDER — ISOSORBIDE MONONITRATE ER 30 MG PO TB24: 30.0000 mg | ORAL_TABLET | Freq: Every day | ORAL | Status: DC

## 2017-02-27 MED ORDER — NORTRIPTYLINE HCL 10 MG PO CAPS
10.0000 mg | ORAL_CAPSULE | Freq: Every day | ORAL | Status: DC
  Administered 2017-02-27 – 2017-03-03 (×5): 10 mg via ORAL
  Filled 2017-02-27 (×6): qty 1

## 2017-02-27 MED ORDER — CLONIDINE HCL 0.1 MG PO TABS
0.1000 mg | ORAL_TABLET | Freq: Two times a day (BID) | ORAL | Status: DC
  Administered 2017-02-27 – 2017-03-01 (×6): 0.1 mg via ORAL
  Filled 2017-02-27 (×6): qty 1

## 2017-02-27 MED ORDER — PREGABALIN 50 MG PO CAPS
50.0000 mg | ORAL_CAPSULE | Freq: Two times a day (BID) | ORAL | Status: DC
  Administered 2017-02-27 – 2017-03-04 (×11): 50 mg via ORAL
  Filled 2017-02-27 (×11): qty 1

## 2017-02-27 MED ORDER — LINAGLIPTIN 5 MG PO TABS
5.0000 mg | ORAL_TABLET | Freq: Every day | ORAL | Status: DC
  Administered 2017-02-27 – 2017-03-04 (×6): 5 mg via ORAL
  Filled 2017-02-27 (×6): qty 1

## 2017-02-27 MED ORDER — ACETAMINOPHEN 325 MG PO TABS
650.0000 mg | ORAL_TABLET | Freq: Four times a day (QID) | ORAL | Status: DC | PRN
  Administered 2017-02-27 – 2017-03-01 (×3): 650 mg via ORAL
  Filled 2017-02-27 (×3): qty 2

## 2017-02-27 MED ORDER — ALBUTEROL SULFATE (2.5 MG/3ML) 0.083% IN NEBU: 2.5000 mg | INHALATION_SOLUTION | Freq: Four times a day (QID) | RESPIRATORY_TRACT | Status: DC | PRN

## 2017-02-27 MED ORDER — SODIUM CHLORIDE 0.9% FLUSH
3.0000 mL | Freq: Two times a day (BID) | INTRAVENOUS | Status: DC
  Administered 2017-02-27 – 2017-03-04 (×11): 3 mL via INTRAVENOUS

## 2017-02-27 MED ORDER — DIPHENOXYLATE-ATROPINE 2.5-0.025 MG PO TABS: 1.0000 | ORAL_TABLET | Freq: Three times a day (TID) | ORAL | Status: DC | PRN

## 2017-02-27 MED ORDER — VANCOMYCIN 50 MG/ML ORAL SOLUTION
125.0000 mg | Freq: Four times a day (QID) | ORAL | Status: DC
  Administered 2017-02-27 – 2017-03-04 (×22): 125 mg via ORAL
  Filled 2017-02-27 (×23): qty 2.5

## 2017-02-27 MED ORDER — PREDNISONE 10 MG PO TABS: 5.0000 mg | ORAL_TABLET | Freq: Every day | ORAL | Status: DC

## 2017-02-27 MED ORDER — ACETAMINOPHEN 650 MG RE SUPP: 650.0000 mg | Freq: Four times a day (QID) | RECTAL | Status: DC | PRN

## 2017-02-27 MED ORDER — TOPIRAMATE 25 MG PO TABS
25.0000 mg | ORAL_TABLET | Freq: Two times a day (BID) | ORAL | Status: DC
  Administered 2017-02-27 – 2017-03-04 (×11): 25 mg via ORAL
  Filled 2017-02-27 (×13): qty 1

## 2017-02-27 MED ORDER — SODIUM CHLORIDE 0.9 % IV BOLUS (SEPSIS)
500.0000 mL | INTRAVENOUS | Status: AC
  Administered 2017-02-27: 500 mL via INTRAVENOUS

## 2017-02-27 MED ORDER — IPRATROPIUM-ALBUTEROL 0.5-2.5 (3) MG/3ML IN SOLN
3.0000 mL | Freq: Once | RESPIRATORY_TRACT | Status: AC
  Administered 2017-02-27: 3 mL via RESPIRATORY_TRACT
  Filled 2017-02-27: qty 3

## 2017-02-27 MED ORDER — ESCITALOPRAM OXALATE 10 MG PO TABS
10.0000 mg | ORAL_TABLET | Freq: Every morning | ORAL | Status: DC
  Administered 2017-02-27 – 2017-03-04 (×6): 10 mg via ORAL
  Filled 2017-02-27 (×6): qty 1

## 2017-02-27 NOTE — Clinical Social Work Note (Signed)
CSW received consult for SNF placement. CSW will follow pending PT recommendation.  Mishel Sans Martha Annalicia Renfrew, MSW, LCSWA [phone] 

## 2017-02-27 NOTE — Progress Notes (Signed)
Patient admitted for dehydration is currently being treated for C. Diff w/ oral vancomycin. Patient was prescribed lomotil for diarrhea.  Since patient is being treated for active C. Diff spoke to MD to hold lomotil to prevent toxin build-up in GI tract.  MD agrees with plan Will hold lomotil.  Tobie Lords, PharmD, BCPS Clinical Pharmacist 02/27/2017

## 2017-02-27 NOTE — ED Provider Notes (Signed)
[facility] Emergency Department Provider Note  ____________________________________________   First MD Initiated Contact with Patient 02/27/17 0003     (approximate)  I have reviewed the triage vital signs and the nursing notes.   HISTORY  Chief Complaint Shortness of Breath    HPI Meghan [name] is a 71 y.o. female with an extensive PMH and who just left the hospital within the last 12 hours who presents by EMS for shortness of breath, near syncope, and tachycardia.  Was admitted and treated for C. diff colitis.  She appeared to have stabilized on oral vancomycin and was tolerating food and drink and she reports that her volume of stools has gone down.  She was sent home today but she states she feels this was too early.  Once she got home she was persistently short of breath similar to prior issues with COPD.  She had just gotten home and was placing her purse on the floor when she became very lightheaded and thought she was going pass out.  She fell onto the ground but did not sustain any injuries.  She denies headache and neck pain.  For EMS her heart rate was in the 140s and she is retracting a bit.  She was still in the 140s upon arrival in the emergency department.  She still reports some mild shortness of breath and wheezing, denies chest pain, denies fever/chills, denies abdominal pain.  She has had no recent vomiting.  She reports the symptoms as severe and were relatively acute in onset.  Nothing in particular makes the patient's symptoms better nor worse.    Past Medical History:  Diagnosis Date  . Anginal pain ([place])   . Anxiety   . Arthritis    RA  . Asthma   . Brain tumor (benign) ([place])   . CHF (congestive heart failure) ([place])   . Collagen vascular disease ([place])   . COPD (chronic obstructive pulmonary disease) ([place])   . Coronary artery disease   . DDD (degenerative disc disease)   . Depression   . Diabetes mellitus   . GERD (gastroesophageal  reflux disease)   . Headache   . Heart murmur   . Heart murmur   . Hypercholesteremia   . Hypertension   . Lumbar degenerative disc disease   . Migraines   . Obese   . Obesity   . Osteopenia   . Osteoporosis   . Pneumonia   . PTSD (post-traumatic stress disorder)     Patient Active Problem List   Diagnosis Date Noted  . Colitis 02/22/2017  . Seizure ([place]) 01/03/2016  . Chronic tension-type headache, intractable 12/04/2015  . 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 Degeneration of intervertebral disc of lumbar region 07/15/2015  . Seropositive rheumatoid arthritis ([place]) 07/15/2015  . Asthma with acute exacerbation 03/10/2015  . Hypokalemia 03/01/2015  . Hyponatremia 03/01/2015  . DDD (degenerative disc disease), lumbar 01/31/2015  . Arthritis, degenerative 01/31/2015  . Rheumatoid arthritis with rheumatoid factor ([place]) 01/31/2015  . Diabetes mellitus, type II ([place]) 12/24/2014  . HTN (hypertension) 12/24/2014  . Sepsis ([place]) 12/24/2014  . Left knee pain 12/24/2014  . GERD (gastroesophageal reflux disease) 12/24/2014  . COPD (chronic obstructive pulmonary disease) ([place]) 12/24/2014  . Depression 12/24/2014  . Anxiety 12/24/2014  . Severe bipolar disorder with psychotic features, mood-congruent ([place]) 11/16/2014  . Neurosis, posttraumatic 11/16/2014  . H/O diabetes mellitus 11/16/2014  . H/O: HTN (hypertension) 11/16/2014  . H/O gastric ulcer 11/16/2014  . H/O:  obesity 11/16/2014  . H/O arthritis 11/16/2014  . Barton's fracture of distal radius, closed 09/19/2014  . Neuritis or radiculitis due to rupture of lumbar intervertebral disc 05/11/2014  . Cervico-occipital neuralgia 03/26/2014  . Difficulty in walking 03/26/2014  . Difficulty in walking, not elsewhere classified 03/26/2014  . Cervical spine syndrome 01/26/2014  . Cephalalgia 01/09/2014  . Disordered sleep 01/09/2014  . BP (high blood pressure) 10/19/2013  . Adiposity 10/19/2013  . Cardiac murmur 10/19/2013  .  PNA (pneumonia) 10/19/2013  . Breath shortness 10/19/2013  . Chronic obstructive pulmonary disease ([place]) 10/19/2013  . Diabetes mellitus ([place]) 10/19/2013    Past Surgical History:  Procedure Laterality Date  . ABDOMINAL HYSTERECTOMY    . CERVICAL FUSION    . CHOLECYSTECTOMY    . COLONOSCOPY WITH PROPOFOL N/A 09/20/2015   Procedure: COLONOSCOPY WITH PROPOFOL;  Surgeon: Josefine Class, MD;  Location[facility] ENDOSCOPY;  Service: Endoscopy;  Laterality: N/A;  . COLONOSCOPY WITH PROPOFOL N/A 01/27/2016   Procedure: COLONOSCOPY WITH PROPOFOL;  Surgeon: Manya Silvas, MD;  Location: [facility] ENDOSCOPY;  Service: Endoscopy;  Laterality: N/A;  . FINGER ARTHROPLASTY  02/23/2012   Procedure: FINGER ARTHROPLASTY;  Surgeon: Cammie Sickle., MD;  Location: [facility];  Service: Orthopedics;  Laterality: Left;  Extensor carpi radialis longus to Extensor carpi ulnaris transfer, left Metaphalangeal reconstructions of index and long fingers,  . FOOT ARTHROPLASTY     toes x2 rt foot  . HAND RECONSTRUCTION  2011   right-multiple finger joint reconst  . JOINT REPLACEMENT     bilat knee replacements    Prior to Admission medications   Medication Sig Start Date End Date Taking? Authorizing Provider  albuterol (PROVENTIL HFA;VENTOLIN HFA) 108 (90 BASE) MCG/ACT inhaler Inhale 2 puffs into the lungs every 6 (six) hours as needed for wheezing or shortness of breath.     [provider]  cloNIDine (CATAPRES) 0.1 MG tablet Take 0.1 mg by mouth 2 (two) times daily.    [provider]  diphenoxylate-atropine (LOMOTIL) 2.5-0.025 MG tablet Take 1 tablet by mouth 3 (three) times daily as needed for diarrhea or loose stools.    [provider]  escitalopram (LEXAPRO) 10 MG tablet Take 1 tablet (10 mg total) by mouth every morning. 09/11/16   Rainey Pines, MD  esomeprazole (NEXIUM) 40 MG capsule Take 40 mg by mouth at bedtime.    [provider]  insulin starter kit-  pen needles MISC 1 kit by Other route once. 03/09/15   Aldean Jewett, MD  isosorbide mononitrate (IMDUR) 30 MG 24 hr tablet Take 30 mg by mouth daily.    [provider]  lamoTRIgine (LAMICTAL) 100 MG tablet Take 1 tablet (100 mg total) by mouth daily. 06/02/16   Rainey Pines, MD  meloxicam (MOBIC) 7.5 MG tablet daily.  04/15/16   [provider]  montelukast (SINGULAIR) 10 MG tablet Take 10 mg by mouth daily.    [provider]  nortriptyline (PAMELOR) 10 MG capsule Take 10 mg by mouth at bedtime.    [provider]  ondansetron (ZOFRAN) 4 MG tablet Take 4 mg by mouth 3 (three) times daily.    [provider]  ONE TOUCH ULTRA TEST test strip  02/19/15   [provider]  pramipexole (MIRAPEX) 0.25 MG tablet Take 0.25 mg by mouth.    [provider]  predniSONE (DELTASONE) 5 MG tablet Take 5 mg by mouth daily.     [provider]  pregabalin (LYRICA) 50 MG capsule TAKE ONE CAPSULE BY MOUTH TWICE A DAY 06/18/15   [provider]  saxagliptin HCl (ONGLYZA) 5 MG TABS tablet Take 5 mg by mouth at bedtime.    [provider]  Syringe, Disposable, 1 ML MISC 300 Syringes by Does not apply route daily. 03/11/15   Aldean Jewett, MD  topiramate (TOPAMAX) 25 MG tablet Take 25 mg by mouth 2 (two) times daily.    [provider]  vancomycin (VANCOCIN) 50 mg/mL oral solution Take 2.5 mLs (125 mg total) by mouth every 6 (six) hours. 02/26/17 03/08/17  Henreitta Leber, MD    Allergies Gabapentin; Naproxen; Nsaids; and Tramadol  Family History  Problem Relation Age of Onset  . Depression Sister   . Hypertension Mother   . Arthritis/Rheumatoid Mother   . Heart attack Father   . Hypertension Father   . CAD Unknown   . Hypertension Unknown   . Diabetes Mellitus II Unknown   . Arthritis Unknown     Social History Social History  Substance Use Topics  . Smoking status: Never Smoker  . Smokeless tobacco:  Never Used  . Alcohol use No    Review of Systems Constitutional: No fever/chills Eyes: No visual changes. ENT: No sore throat. Cardiovascular: Denies chest pain. Near-syncope Respiratory: +shortness of breath. Gastrointestinal: No abdominal pain.  No nausea, no vomiting.  Recent diarrhea due to C. difficile colitis Genitourinary: Negative for dysuria. Musculoskeletal: Negative for neck pain.  Negative for back pain. Integumentary: Negative for rash. Neurological: Negative for headaches, focal weakness or numbness.  Generalized weakness and near syncope   ____________________________________________   PHYSICAL EXAM:  VITAL SIGNS: ED Triage Vitals  Enc Vitals Group     BP 02/26/17 2314 131/75     Pulse Rate 02/26/17 2314 (!) 140     Resp 02/26/17 2309 (!) 22     Temp 02/26/17 2314 98.4 F (36.9 C)     Temp Source 02/26/17 2314 Oral     SpO2 02/26/17 2314 96 %     Weight 02/26/17 2309 58.5 kg (129 lb)     Height 02/26/17 2309 1.422 m (4' 8" )     Head Circumference --      Peak Flow --      Pain Score 02/26/17 2308 0     Pain Loc --      Pain Edu? --      Excl. in [place]? --     Constitutional: Alert and oriented. Well appearing and in no acute distress. Eyes: Conjunctivae are normal.  Head: Atraumatic. Nose: No congestion/rhinnorhea. Mouth/Throat: Mucous membranes are moist. Neck: No stridor.  No meningeal signs.   Cardiovascular: Normal rate, regular rhythm. Good peripheral circulation. Grossly normal heart sounds. Respiratory: Increased respiratory effort.  Intercostal retractions and accessory muscle usage but relatively mild.  Expiratory wheezing heard in all lung fields. Gastrointestinal: Soft and nontender. No distention.  Neurologic:  Normal speech and language. No gross focal neurologic deficits are appreciated.  Skin:  Skin is warm, dry and intact. No rash noted. Psychiatric: Mood and affect are normal. Speech and behavior are  normal.  ____________________________________________   LABS (all labs ordered are listed, but only abnormal results are displayed)  Labs Reviewed  BASIC METABOLIC PANEL - Abnormal; Notable for the following:       Result Value   Sodium 133 (*)    CO2 21 (*)    Glucose, Bld 154 (*)    Calcium  8.4 (*)    All other components within normal limits  CBC - Abnormal; Notable for the following:    Hemoglobin 11.7 (*)    HCT 34.4 (*)    RDW 15.0 (*)    All other components within normal limits  LACTIC ACID, PLASMA - Abnormal; Notable for the following:    Lactic Acid, Venous 2.9 (*)    All other components within normal limits  HEPATIC FUNCTION PANEL - Abnormal; Notable for the following:    Total Protein 5.2 (*)    Albumin 2.4 (*)    All other components within normal limits  URINALYSIS, COMPLETE (UACMP) WITH MICROSCOPIC - Abnormal; Notable for the following:    Color, Urine AMBER (*)    APPearance CLEAR (*)    Protein, ur 30 (*)    All other components within normal limits  TROPONIN I  LIPASE, BLOOD  LACTIC ACID, PLASMA   ____________________________________________  EKG  ED ECG REPORT I, Cuong Moorman, the attending physician, personally viewed and interpreted this ECG.  Date: 02/26/2017 EKG Time: 23:15 Rate: 128 Rhythm: sinus tachycardia QRS Axis: normal Intervals: normal ST/T Wave abnormalities: Non-specific ST segment / T-wave changes, but similar to prior with no evidence of acute ischemia Narrative Interpretation: no evidence of acute ischemia, unchanged from prior ECG   ____________________________________________  RADIOLOGY   Dg Chest Port 1 View  Result Date: 02/26/2017 CLINICAL DATA:  Shortness of breath after fall today after being discharged from the hospital for colitis. EXAM: PORTABLE CHEST 1 VIEW COMPARISON:  Chest CT 2 days prior 02/24/2017 FINDINGS: Again seen elevation of right hemidiaphragm. Small bilateral pleural effusions appear grossly  similar from CT. Unchanged heart size and mediastinal contours. No pneumothorax or focal airspace disease. No acute osseous abnormalities are seen. IMPRESSION: Chronic elevation of right hemidiaphragm with small bilateral pleural effusions, stable from recent CT. No new abnormality. Electronically Signed   By: Jeb Levering M.D.   On: 02/26/2017 23:49    ____________________________________________   PROCEDURES  Critical Care performed: No   Procedure(s) performed:   Procedures   ____________________________________________   INITIAL IMPRESSION / ASSESSMENT AND PLAN / ED COURSE  Pertinent labs & imaging results that were available during my care of the patient were reviewed by me and considered in my medical decision making (see chart for details).  We will recheck labs including electrolytes, CBC, lactic acid, etc.  I suspect that the patient is volume depleted from her colitis which would explain the near syncope and the tachycardia.  She is afebrile and in spite of her chronic illness she does not appear toxic or septic.  I will reassess as her labs come back.   Clinical Course as of Feb 27 133  Fri Feb 26, 2017  2355 Lactate is elevated but patient is afebrile and has no leukocytosis.  Does have tachycardia.  With the current information available to me, I don't think the patient's . The lactate>4, is related to volume depletion from recent colitis.   Lactic Acid, Venous: (!!) 2.9 [CF]  Sat Feb 27, 2017  0133 Giving Duonebs for difficulty breathing and providing saline rehydration.  Discussed with the patient and will bring her back into the hospital given that she is not yet stabilized from her recent hospitalization.  After about 1 L of fluids her heart rate is still approximately 120 although it is improved from the initial 140 and she has some mild retractions for which I am getting the DuoNeb's.  She at least  needs overnight observation with rehydration and additional  breathing treatments.  The patient agrees with this plan.  I have discussed with the hospitalist who will admit.  [CF]    Clinical Course User Index [CF] Hinda Kehr, MD    ____________________________________________  FINAL CLINICAL IMPRESSION(S) / ED DIAGNOSES  Final diagnoses:  Tachycardia  Shortness of breath  Volume depletion  Elevated lactic acid level     MEDICATIONS GIVEN DURING THIS VISIT:  Medications  sodium chloride 0.9 % bolus 500 mL (0 mLs Intravenous Stopped 02/27/17 0019)  sodium chloride 0.9 % bolus 500 mL (500 mLs Intravenous New Bag/Given 02/27/17 0019)  ipratropium-albuterol (DUONEB) 0.5-2.5 (3) MG/3ML nebulizer solution 3 mL (3 mLs Nebulization Given 02/27/17 0122)  ipratropium-albuterol (DUONEB) 0.5-2.5 (3) MG/3ML nebulizer solution 3 mL (3 mLs Nebulization Given 02/27/17 0122)     NEW OUTPATIENT MEDICATIONS STARTED DURING THIS VISIT:  New Prescriptions   No medications on file    Modified Medications   No medications on file    Discontinued Medications   No medications on file     Note:  This document was prepared using Dragon voice recognition software and may include unintentional dictation errors.    Hinda Kehr, MD 02/27/17 [phone]

## 2017-02-27 NOTE — ED Notes (Signed)
Pt questioning length in obtaining an admission bed. Pt again informed of admission bed assignment process, pt verbalizes understanding.

## 2017-02-27 NOTE — Progress Notes (Signed)
Physical Therapy Evaluation Patient Details Name: Meghan [name] MRN: [id number] DOB: Aug 07, 1946 Today's Date: 02/27/2017   History of Present Illness  Patient is a 71 y.o. female admitted on 16 JUL after d/c from hospital day prior due to mechanical fall while lifting purse. PMH includes c. diff., COPD, CAD, DDD, GERD, HLD, CHF, and osteoporosis.  Clinical Impression  Patient admitted for above listed reasons. On evaluation, patient requires minimal assistance for bed mobility, transfers, and gait, as she demonstrates unsteadiness with tendency to push RW too far in front of self, showing decreased awareness of surroundings. Patient previously independent, occasionally utilizing RW, living along. Patient has recent history of falling and will benefit from skilled and progressive PT to return to PLOF. Patient will benefit from SNF upon d/c when medically ready to prevent hospital readmissions in the future.    Follow Up Recommendations SNF    Equipment Recommendations  None recommended by PT    Recommendations for Other Services       Precautions / Restrictions Precautions Precautions: Fall Restrictions Weight Bearing Restrictions: No      Mobility  Bed Mobility Overal bed mobility: Needs Assistance Bed Mobility: Supine to Sit;Sit to Supine     Supine to sit: Min assist Sit to supine: Min assist   General bed mobility comments: Patient performs bed mobility with minimal assistance and verbal cues for sequencing.  Transfers Overall transfer level: Needs assistance Equipment used: Rolling walker (2 wheeled) Transfers: Sit to/from Stand Sit to Stand: Min assist         General transfer comment: Patient moves from sit to stand and stand to sit with verbal cues for safe sequencing and minimal assistance.  Ambulation/Gait Ambulation/Gait assistance: Min assist Ambulation Distance (Feet): 60 Feet Assistive device: Rolling walker (2 wheeled)       General Gait Details:  Patient ambulates at decreased cadence, tending to push RW too far in front of self with decreased awareness of surroundings. Required minimal assistance to redirect to not run into walls/objects.  Stairs            Wheelchair Mobility    Modified Rankin (Stroke Patients Only)       Balance Overall balance assessment: Needs assistance;History of Falls Sitting-balance support: Bilateral upper extremity supported Sitting balance-Leahy Scale: Good     Standing balance support: Bilateral upper extremity supported Standing balance-Leahy Scale: Fair                               Pertinent Vitals/Pain Pain Assessment: No/denies pain    Home Living Family/patient expects to be discharged to:: Private residence Living Arrangements: Alone Available Help at Discharge: Neighbor;Available PRN/intermittently Type of Home: House Home Access: Stairs to enter Entrance Stairs-Rails: Can reach both Entrance Stairs-Number of Steps: 2 Home Layout: One level Home Equipment: Walker - 2 wheels      Prior Function Level of Independence: Independent with assistive device(s)               Hand Dominance        Extremity/Trunk Assessment   Upper Extremity Assessment Upper Extremity Assessment: Generalized weakness    Lower Extremity Assessment Lower Extremity Assessment: Generalized weakness       Communication   Communication: No difficulties  Cognition Arousal/Alertness: Awake/alert Behavior During Therapy: WFL for tasks assessed/performed;Flat affect Overall Cognitive Status: Within Functional Limits for tasks assessed  General Comments      Exercises     Assessment/Plan    PT Assessment Patient needs continued PT services  PT Problem List Decreased strength;Decreased activity tolerance;Decreased balance;Decreased mobility;Decreased safety awareness;Decreased knowledge of use of DME        PT Treatment Interventions DME instruction;Gait training;Stair training;Functional mobility training;Therapeutic activities;Therapeutic exercise;Balance training;Patient/family education    PT Goals (Current goals can be found in the Care Plan section)  Acute Rehab PT Goals Patient Stated Goal: To get better PT Goal Formulation: With patient Time For Goal Achievement: 03/13/17 Potential to Achieve Goals: Good    Frequency Min 2X/week   Barriers to discharge Inaccessible home environment;Decreased caregiver support      Co-evaluation               AM-PAC PT "6 Clicks" Daily Activity  Outcome Measure Difficulty turning over in bed (including adjusting bedclothes, sheets and blankets)?: A Little Difficulty moving from lying on back to sitting on the side of the bed? : A Little Difficulty sitting down on and standing up from a chair with arms (e.g., wheelchair, bedside commode, etc,.)?: A Little Help needed moving to and from a bed to chair (including a wheelchair)?: A Little Help needed walking in hospital room?: A Lot Help needed climbing 3-5 steps with a railing? : A Lot 6 Click Score: 16    End of Session Equipment Utilized During Treatment: Gait belt Activity Tolerance: Patient tolerated treatment well;Patient limited by fatigue Patient left: in bed;with call bell/phone within reach;with bed alarm set Nurse Communication: Mobility status PT Visit Diagnosis: Unsteadiness on feet (R26.81);History of falling (Z91.81);Muscle weakness (generalized) (M62.81)    Time: 1165-7903 PT Time Calculation (min) (ACUTE ONLY): 27 min   Charges:   PT Evaluation $PT Eval Low Complexity: 1 Procedure     PT G Codes:          Dorice Lamas, PT, DPT 02/27/2017, 3:43 PM

## 2017-02-27 NOTE — ED Notes (Signed)
Dr. pyreddy in to see pt.

## 2017-02-27 NOTE — H&P (Signed)
[facility] [place] NAME: Meghan [name]    MR#:  [id number]  DATE OF BIRTH:  Dec 23, 1945  DATE OF ADMISSION:  02/26/2017  PRIMARY CARE PHYSICIAN: Lavera Guise, MD   REQUESTING/REFERRING PHYSICIAN:   CHIEF COMPLAINT:   Chief Complaint  Patient presents with  . Shortness of Breath    HISTORY OF PRESENT ILLNESS: Meghan [name]  is a 71 y.o. female with a known history of C. difficile colitis, COPD, coronary artery disease, degenerative disc disease, GERD, hyperlipidemia, congestive heart failure, osteoporosis presented to the emergency room within 12 hours after being discharged home. Patient felt weak and when she was trying to lift her purse she lost balance and fell down. No history of any head injury. She appears dry and dehydrated and weak. Patient was tachycardic when she presented to the emergency room. She was started on IV fluids and received IV fluid bolus. Her serum lactate level is elevated. Currently she is on oral vancomycin for C. difficile colitis. Patient has formed stools and diarrhea has decreased. Hospitalist service was consulted for further care of the patient.  PAST MEDICAL HISTORY:   Past Medical History:  Diagnosis Date  . Anginal pain ([place])   . Anxiety   . Arthritis    RA  . Asthma   . Brain tumor (benign) ([place])   . CHF (congestive heart failure) ([place])   . Collagen vascular disease ([place])   . COPD (chronic obstructive pulmonary disease) ([place])   . Coronary artery disease   . DDD (degenerative disc disease)   . Depression   . Diabetes mellitus   . GERD (gastroesophageal reflux disease)   . Headache   . Heart murmur   . Heart murmur   . Hypercholesteremia   . Hypertension   . Lumbar degenerative disc disease   . Migraines   . Obese   . Obesity   . Osteopenia   . Osteoporosis   . Pneumonia   . PTSD (post-traumatic stress disorder)     PAST SURGICAL HISTORY: Past Surgical History:  Procedure Laterality  Date  . ABDOMINAL HYSTERECTOMY    . CERVICAL FUSION    . CHOLECYSTECTOMY    . COLONOSCOPY WITH PROPOFOL N/A 09/20/2015   Procedure: COLONOSCOPY WITH PROPOFOL;  Surgeon: Josefine Class, MD;  Location: [facility] ENDOSCOPY;  Service: Endoscopy;  Laterality: N/A;  . COLONOSCOPY WITH PROPOFOL N/A 01/27/2016   Procedure: COLONOSCOPY WITH PROPOFOL;  Surgeon: Manya Silvas, MD;  Location: [facility] ENDOSCOPY;  Service: Endoscopy;  Laterality: N/A;  . FINGER ARTHROPLASTY  02/23/2012   Procedure: FINGER ARTHROPLASTY;  Surgeon: Cammie Sickle., MD;  Location: [facility];  Service: Orthopedics;  Laterality: Left;  Extensor carpi radialis longus to Extensor carpi ulnaris transfer, left Metaphalangeal reconstructions of index and long fingers,  . FOOT ARTHROPLASTY     toes x2 rt foot  . HAND RECONSTRUCTION  2011   right-multiple finger joint reconst  . JOINT REPLACEMENT     bilat knee replacements    SOCIAL HISTORY:  Social History  Substance Use Topics  . Smoking status: Never Smoker  . Smokeless tobacco: Never Used  . Alcohol use No    FAMILY HISTORY:  Family History  Problem Relation Age of Onset  . Depression Sister   . Hypertension Mother   . Arthritis/Rheumatoid Mother   . Heart attack Father   . Hypertension Father   . CAD Unknown   . Hypertension Unknown   .  Diabetes Mellitus II Unknown   . Arthritis Unknown     DRUG ALLERGIES:  Allergies  Allergen Reactions  . Gabapentin Other (See Comments)    Pt states that it causes her BP to drop.   . Naproxen Hives  . Nsaids Other (See Comments)    Reaction:  Unknown   . Tramadol Itching    REVIEW OF SYSTEMS:   CONSTITUTIONAL: No fever, has weakness.  EYES: No blurred or double vision.  EARS, NOSE, AND THROAT: No tinnitus or ear pain.  RESPIRATORY: No cough, shortness of breath, wheezing or hemoptysis.  CARDIOVASCULAR: No chest pain, orthopnea, edema.  GASTROINTESTINAL: No nausea, vomiting, diarrhea or abdominal  pain.  GENITOURINARY: No dysuria, hematuria.  ENDOCRINE: No polyuria, nocturia,  HEMATOLOGY: No anemia, easy bruising or bleeding SKIN: No rash or lesion. MUSCULOSKELETAL: No joint pain or arthritis.   NEUROLOGIC: No tingling, numbness, weakness.  PSYCHIATRY: No anxiety or depression.   MEDICATIONS AT HOME:  Prior to Admission medications   Medication Sig Start Date End Date Taking? Authorizing Provider  albuterol (PROVENTIL HFA;VENTOLIN HFA) 108 (90 BASE) MCG/ACT inhaler Inhale 2 puffs into the lungs every 6 (six) hours as needed for wheezing or shortness of breath.    Yes [provider]  cloNIDine (CATAPRES) 0.1 MG tablet Take 0.1 mg by mouth 2 (two) times daily.   Yes [provider]  diphenoxylate-atropine (LOMOTIL) 2.5-0.025 MG tablet Take 1 tablet by mouth 3 (three) times daily as needed for diarrhea or loose stools.   Yes [provider]  escitalopram (LEXAPRO) 10 MG tablet Take 1 tablet (10 mg total) by mouth every morning. 09/11/16  Yes Rainey Pines, MD  esomeprazole (NEXIUM) 40 MG capsule Take 40 mg by mouth at bedtime.   Yes [provider]  insulin starter kit- pen needles MISC 1 kit by Other route once. 03/09/15  Yes Aldean Jewett, MD  isosorbide mononitrate (IMDUR) 30 MG 24 hr tablet Take 30 mg by mouth daily.   Yes [provider]  lamoTRIgine (LAMICTAL) 100 MG tablet Take 1 tablet (100 mg total) by mouth daily. 06/02/16  Yes Rainey Pines, MD  meloxicam (MOBIC) 7.5 MG tablet daily.  04/15/16  Yes [provider]  montelukast (SINGULAIR) 10 MG tablet Take 10 mg by mouth daily.   Yes [provider]  nortriptyline (PAMELOR) 10 MG capsule Take 10 mg by mouth at bedtime.   Yes [provider]  ondansetron (ZOFRAN) 4 MG tablet Take 4 mg by mouth 3 (three) times daily.   Yes [provider]  ONE TOUCH ULTRA TEST test strip  02/19/15  Yes [provider]  pramipexole (MIRAPEX) 0.25 MG tablet  Take 0.25 mg by mouth.   Yes [provider]  predniSONE (DELTASONE) 5 MG tablet Take 5 mg by mouth daily.    Yes [provider]  pregabalin (LYRICA) 50 MG capsule TAKE ONE CAPSULE BY MOUTH TWICE A DAY 06/18/15  Yes [provider]  saxagliptin HCl (ONGLYZA) 5 MG TABS tablet Take 5 mg by mouth at bedtime.   Yes [provider]  Syringe, Disposable, 1 ML MISC 300 Syringes by Does not apply route daily. 03/11/15  Yes Aldean Jewett, MD  topiramate (TOPAMAX) 25 MG tablet Take 25 mg by mouth 2 (two) times daily.   Yes [provider]  vancomycin (VANCOCIN) 50 mg/mL oral solution Take 2.5 mLs (125 mg total) by mouth every 6 (six) hours. 02/26/17 03/08/17 Yes Henreitta Leber, MD  PHYSICAL EXAMINATION:   VITAL SIGNS: Blood pressure 116/77, pulse (!) 119, temperature 98.4 F (36.9 C), temperature source Oral, resp. rate (!) 23, height _0  (1.422 m), weight 58.5 kg (129 lb), SpO2 96 %.  GENERAL:  71 y.o.-year-old patient lying in the bed with no acute distress.  EYES: Pupils equal, round, reactive to light and accommodation. No scleral icterus. Extraocular muscles intact.  HEENT: Head atraumatic, normocephalic. Oropharynx dry and nasopharynx clear.  NECK:  Supple, no jugular venous distention. No thyroid enlargement, no tenderness.  LUNGS: Normal breath sounds bilaterally, no wheezing, rales,rhonchi or crepitation. No use of accessory muscles of respiration.  CARDIOVASCULAR: S1, S2 normal. No murmurs, rubs, or gallops.  ABDOMEN: Soft, nontender, nondistended. Bowel sounds present. No organomegaly or mass.  EXTREMITIES: No pedal edema, cyanosis, or clubbing.  NEUROLOGIC: Cranial nerves II through XII are intact. Muscle strength 5/5 in all extremities. Sensation intact. Gait not checked.  PSYCHIATRIC: The patient is alert and oriented x 3.  SKIN: No obvious rash, lesion, or ulcer.   LABORATORY PANEL:   CBC  Recent Labs Lab 02/22/17 1106  02/23/17 0013 02/26/17 2316  WBC 13.8* 8.5 6.2  HGB 14.9 13.9 11.7*  HCT 43.4 39.0 34.4*  PLT 348 312 247  MCV 86.9 86.3 89.2  MCH 29.8 30.7 30.4  MCHC 34.3 35.6 34.1  RDW 14.2 14.4 15.0*   ------------------------------------------------------------------------------------------------------------------  Chemistries   Recent Labs Lab 02/22/17 1106 02/23/17 0013 02/24/17 0458 02/26/17 2316  NA 131* 133* 135 133*  K 3.2* 3.0* 4.7 4.1  CL 95* 104 110 103  CO2 23 21* 21* 21*  GLUCOSE 238* 104* 129* 154*  BUN _1 CREATININE 0.90 0.60 0.65 0.64  CALCIUM 8.3* 7.7* 7.7* 8.4*  MG 1.5*  --  2.0  --   AST 32  --   --  40  ALT 12*  --   --  18  ALKPHOS 80  --   --  76  BILITOT 1.3*  --   --  0.7   ------------------------------------------------------------------------------------------------------------------ estimated creatinine clearance is 46.7 mL/min (by C-G formula based on SCr of 0.64 mg/dL). ------------------------------------------------------------------------------------------------------------------ No results for input(s): TSH, T4TOTAL, T3FREE, THYROIDAB in the last 72 hours.  Invalid input(s): FREET3   Coagulation profile No results for input(s): INR, PROTIME in the last 168 hours. ------------------------------------------------------------------------------------------------------------------- No results for input(s): DDIMER in the last 72 hours. -------------------------------------------------------------------------------------------------------------------  Cardiac Enzymes  Recent Labs Lab 02/22/17 1921 02/23/17 0013 02/26/17 2316  TROPONINI 0.04* <0.03 <0.03   ------------------------------------------------------------------------------------------------------------------ Invalid input(s): POCBNP  ---------------------------------------------------------------------------------------------------------------  Urinalysis    Component  Value Date/Time   COLORURINE AMBER (A) 02/26/2017 2316   APPEARANCEUR CLEAR (A) 02/26/2017 2316   APPEARANCEUR Turbid (A) 05/05/2016 1010   LABSPEC 1.026 02/26/2017 2316   PHURINE 7.0 02/26/2017 2316   GLUCOSEU NEGATIVE 02/26/2017 2316   HGBUR NEGATIVE 02/26/2017 2316   BILIRUBINUR NEGATIVE 02/26/2017 2316   BILIRUBINUR Negative 05/05/2016 1010   KETONESUR NEGATIVE 02/26/2017 2316   PROTEINUR 30 (A) 02/26/2017 2316   NITRITE NEGATIVE 02/26/2017 2316   LEUKOCYTESUR NEGATIVE 02/26/2017 2316   LEUKOCYTESUR 1+ (A) 05/05/2016 1010     RADIOLOGY: Dg Chest Port 1 View  Result Date: 02/26/2017 CLINICAL DATA:  Shortness of breath after fall today after being discharged from the hospital for colitis. EXAM: PORTABLE CHEST 1 VIEW COMPARISON:  Chest CT 2 days prior 02/24/2017 FINDINGS: Again seen elevation of right hemidiaphragm. Small bilateral pleural effusions appear grossly similar from CT. Unchanged heart size  and mediastinal contours. No pneumothorax or focal airspace disease. No acute osseous abnormalities are seen. IMPRESSION: Chronic elevation of right hemidiaphragm with small bilateral pleural effusions, stable from recent CT. No new abnormality. Electronically Signed   By: Jeb Levering M.D.   On: 02/26/2017 23:49    EKG: Orders placed or performed during the hospital encounter of 02/26/17  . ED EKG  . ED EKG  . EKG 12-Lead  . EKG 12-Lead    IMPRESSION AND PLAN: 71 year old female patient with history of C. difficile colitis currently under treatment, congestive heart failure, COPD, degenerative disc disease, diabetes mellitus, GERD, osteoporosis presented to the emergency room with weakness, tachycardia and fall. Admitting diagnosis 1. Dehydration 2. Elevated lactate level 3. Hyponatremia 4. History of C. difficile colitis 5. Type 2 diabetes mellitus Treatment plan Admit patient to medical floor IV fluid hydration Follow up lactic acid level Elevated lactate level could  be secondary to dehydration Continue oral vancomycin for C. difficile colitis Enteric precautions  All the records are reviewed and case discussed with ED provider. Management plans discussed with the patient, family and they are in agreement.  CODE STATUS:FULL CODE Code Status History    Date Active Date Inactive Code Status Order ID Comments User Context   02/22/2017  3:47 PM 02/26/2017 11:05 PM Full Code [id number]  Vaughan Basta, MD Inpatient   03/01/2015  9:12 PM 03/10/2015  8:17 PM Full Code [id number]  Demetrios Loll, MD Inpatient   12/24/2014  5:27 AM 12/25/2014  6:32 PM DNR [id number]  Lance Coon, MD Inpatient    Advance Directive Documentation     Most Recent Value  Type of Advance Directive  Living will  Pre-existing out of facility DNR order (yellow form or pink MOST form)  -  "MOST" Form in Place?  -       TOTAL TIME TAKING CARE OF THIS PATIENT: 51 minutes.    Saundra Shelling M.D on 02/27/2017 at 2:34 AM  Between 7am to 6pm - Pager - [phone]  After 6pm go to [email or web address] - password [email or web address] [facility]  [facility]  Office  [phone]  CC: Primary care physician; Lavera Guise, MD

## 2017-02-27 NOTE — ED Notes (Signed)
Pt transport to 254

## 2017-02-27 NOTE — Progress Notes (Signed)
[place] [facility] [place] NAME: Meghan [name]    MR#:  [id number]  DATE OF BIRTH:  1946-03-23  SUBJECTIVE:   Patient just discharged yesterday after being treated for C. difficile diarrhea/colitis now returns back after a fall at home and noted to be short of breath. Still complaining of some shortness of breath and generalized weakness.  REVIEW OF SYSTEMS:    Review of Systems  Constitutional: Negative for chills and fever.  HENT: Negative for congestion and tinnitus.   Eyes: Negative for blurred vision and double vision.  Respiratory: Positive for shortness of breath. Negative for cough and wheezing.   Cardiovascular: Negative for chest pain, orthopnea and PND.  Gastrointestinal: Negative for abdominal pain, diarrhea, nausea and vomiting.  Genitourinary: Negative for dysuria and hematuria.  Neurological: Positive for weakness (Generalized). Negative for dizziness, sensory change and focal weakness.  All other systems reviewed and are negative.   Nutrition: Heart healthy Tolerating Diet: Yes Tolerating PT: Await evaluation   DRUG ALLERGIES:   Allergies  Allergen Reactions  . Gabapentin Other (See Comments)    Pt states that it causes her BP to drop.   . Naproxen Hives  . Nsaids Other (See Comments)    Reaction:  Unknown   . Tramadol Itching    VITALS:  Blood pressure 103/80, pulse (!) 113, temperature 97.7 F (36.5 C), temperature source Oral, resp. rate 20, height 4\' 8"  (1.422 m), weight 65.7 kg (144 lb 12.8 oz), SpO2 96 %.  PHYSICAL EXAMINATION:   Physical Exam  GENERAL:  71 y.o.-year-old obese patient lying in bed in no acute distress.  EYES: Pupils equal, round, reactive to light and accommodation. No scleral icterus. Extraocular muscles intact.  HEENT: Head atraumatic, normocephalic. Oropharynx and nasopharynx clear.  NECK:  Supple, no jugular venous distention. No thyroid enlargement, no tenderness.  LUNGS: Prolonged Insp.  & exp.phase, minimal wheezing b/l, No rales, rhonchi. No use of accessory muscles of respiration.  CARDIOVASCULAR: S1, S2 normal. No murmurs, rubs, or gallops.  ABDOMEN: Soft, nontender, nondistended. Bowel sounds present. No organomegaly or mass.  EXTREMITIES: No cyanosis, clubbing or edema b/l.    NEUROLOGIC: Cranial nerves II through XII are intact. No focal Motor or sensory deficits b/l.  Globally weak PSYCHIATRIC: The patient is alert and oriented x 3.  SKIN: No obvious rash, lesion, or ulcer.    LABORATORY PANEL:   CBC  Recent Labs Lab 02/27/17 0715  WBC 5.1  HGB 11.1*  HCT 32.5*  PLT 235   ------------------------------------------------------------------------------------------------------------------  Chemistries   Recent Labs Lab 02/24/17 0458 02/26/17 2316 02/27/17 0715  NA 135 133* 136  K 4.7 4.1 3.8  CL 110 103 105  CO2 21* 21* 24  GLUCOSE 129* 154* 144*  BUN 7 10 9   CREATININE 0.65 0.64 0.58  CALCIUM 7.7* 8.4* 8.3*  MG 2.0  --   --   AST  --  40  --   ALT  --  18  --   ALKPHOS  --  76  --   BILITOT  --  0.7  --    ------------------------------------------------------------------------------------------------------------------  Cardiac Enzymes  Recent Labs Lab 02/27/17 1234  TROPONINI <0.03   ------------------------------------------------------------------------------------------------------------------  RADIOLOGY:  Dg Chest Port 1 View  Result Date: 02/26/2017 CLINICAL DATA:  Shortness of breath after fall today after being discharged from the hospital for colitis. EXAM: PORTABLE CHEST 1 VIEW COMPARISON:  Chest CT 2 days prior 02/24/2017 FINDINGS: Again seen elevation of right  hemidiaphragm. Small bilateral pleural effusions appear grossly similar from CT. Unchanged heart size and mediastinal contours. No pneumothorax or focal airspace disease. No acute osseous abnormalities are seen. IMPRESSION: Chronic elevation of right hemidiaphragm with  small bilateral pleural effusions, stable from recent CT. No new abnormality. Electronically Signed   By: Jeb Levering M.D.   On: 02/26/2017 23:49     ASSESSMENT AND PLAN:   71 year old female with past medical history of collagen vascular disease, COPD, diabetes, hypertension, hyperlipidemia, osteoporosis, PTSD recent admission for C. difficile colitis,, history of brain tumor who presents to the hospital shortness of breath, weakness.  1. Acute respiratory failure with hypoxia-secondary to bronchitis/pneumonitis -Patient had CT scan of the chest on previous hospitalization which was negative for pulmonary embolism. I will start the patient on some IV steroids, scheduled DuoNeb nebs, Pulmicort nebs. Assessment home oxygen prior to discharge.  2. Status post fall/generalized weakness-secondary to deconditioning and also underlying pulmonary illness. -We'll get physical therapy consult to assess patient's mobility.  3. C. difficile colitis-continue oral vancomycin, tolerating by mouth well. Diarrhea has improved.  4. Essential hypertension-continue clonidine, Imdur - BP stable.   5. Diabetes type 2 without complication-continue sliding scale insulin, Tradjenta. Blood sugar stable.  6. DM Neuropathy - cont. Lyrica.   7. GERD - cont. Protonix.   8. Depression/anxiety-continue Xanax, Lexapro.   All the records are reviewed and case discussed with Care Management/Social Worker. Management plans discussed with the patient, family and they are in agreement.  CODE STATUS: Full code  DVT Prophylaxis: Lovenox  TOTAL TIME TAKING CARE OF THIS PATIENT: 30 minutes.   POSSIBLE D/C IN 2-3 DAYS, DEPENDING ON CLINICAL CONDITION.   Henreitta Leber M.D on 02/27/2017 at 2:57 PM  Between 7am to 6pm - Pager - [phone]  After 6pm go to [email or web address] - password [email or web address] [facility] [facility]  Office  [phone]  CC: Primary care physician; Lavera Guise,  MD

## 2017-02-28 LAB — GLUCOSE, CAPILLARY
GLUCOSE-CAPILLARY: 105 mg/dL — AB (ref 65–99)
Glucose-Capillary: 163 mg/dL — ABNORMAL HIGH (ref 65–99)
Glucose-Capillary: 163 mg/dL — ABNORMAL HIGH (ref 65–99)
Glucose-Capillary: 142 mg/dL — ABNORMAL HIGH (ref 65–99)
Glucose-Capillary: 135 mg/dL — ABNORMAL HIGH (ref 65–99)

## 2017-02-28 LAB — CBC
HEMATOCRIT: 33.2 % — AB (ref 35.0–47.0)
Hemoglobin: 11.4 g/dL — ABNORMAL LOW (ref 12.0–16.0)
MCH: 30.7 pg (ref 26.0–34.0)
MCHC: 34.2 g/dL (ref 32.0–36.0)
MCV: 89.6 fL (ref 80.0–100.0)
PLATELETS: 274 10*3/uL (ref 150–440)
RBC: 3.71 MIL/uL — AB (ref 3.80–5.20)
RDW: 15 % — ABNORMAL HIGH (ref 11.5–14.5)
WBC: 4.6 10*3/uL (ref 3.6–11.0)

## 2017-02-28 MED ORDER — IPRATROPIUM-ALBUTEROL 0.5-2.5 (3) MG/3ML IN SOLN
3.0000 mL | Freq: Two times a day (BID) | RESPIRATORY_TRACT | Status: DC
  Administered 2017-02-28 – 2017-03-04 (×8): 3 mL via RESPIRATORY_TRACT
  Filled 2017-02-28 (×8): qty 3

## 2017-02-28 NOTE — Progress Notes (Signed)
[place] [facility] [place] NAME: Meghan [name]    MR#:  [id number]  DATE OF BIRTH:  04/24/1946  SUBJECTIVE:   Shortness of breath, generalized weakness is improved. Diarrhea has also improved. Seen by physical therapy and recommended short-term rehabilitation. Patient is tearful as she is not happy about going to short-term rehabilitation.  REVIEW OF SYSTEMS:    Review of Systems  Constitutional: Negative for chills and fever.  HENT: Negative for congestion and tinnitus.   Eyes: Negative for blurred vision and double vision.  Respiratory: Positive for shortness of breath. Negative for cough and wheezing.   Cardiovascular: Negative for chest pain, orthopnea and PND.  Gastrointestinal: Negative for abdominal pain, diarrhea, nausea and vomiting.  Genitourinary: Negative for dysuria and hematuria.  Neurological: Positive for weakness (Generalized). Negative for dizziness, sensory change and focal weakness.  All other systems reviewed and are negative.   Nutrition: Heart healthy Tolerating Diet: Yes Tolerating PT: Eval noted.   DRUG ALLERGIES:   Allergies  Allergen Reactions  . Gabapentin Other (See Comments)    Pt states that it causes her BP to drop.   . Naproxen Hives  . Nsaids Other (See Comments)    Reaction:  Unknown   . Tramadol Itching    VITALS:  Blood pressure 105/71, pulse (!) 118, temperature 98.2 F (36.8 C), resp. rate 20, height 4\' 8"  (1.422 m), weight 64.5 kg (142 lb 3.2 oz), SpO2 98 %.  PHYSICAL EXAMINATION:   Physical Exam  GENERAL:  71 y.o.-year-old obese patient lying in bed Tearful at times but in NAD.  EYES: Pupils equal, round, reactive to light and accommodation. No scleral icterus. Extraocular muscles intact.  HEENT: Head atraumatic, normocephalic. Oropharynx and nasopharynx clear.  NECK:  Supple, no jugular venous distention. No thyroid enlargement, no tenderness.  LUNGS: Prolonged Insp. & exp.phase, minimal  wheezing b/l, No rales, rhonchi. No use of accessory muscles of respiration.  CARDIOVASCULAR: S1, S2 normal. No murmurs, rubs, or gallops.  ABDOMEN: Soft, nontender, nondistended. Bowel sounds present. No organomegaly or mass.  EXTREMITIES: No cyanosis, clubbing or edema b/l.    NEUROLOGIC: Cranial nerves II through XII are intact. No focal Motor or sensory deficits b/l.  Globally weak PSYCHIATRIC: The patient is alert and oriented x 3.  SKIN: No obvious rash, lesion, or ulcer.    LABORATORY PANEL:   CBC  Recent Labs Lab 02/28/17 0627  WBC 4.6  HGB 11.4*  HCT 33.2*  PLT 274   ------------------------------------------------------------------------------------------------------------------  Chemistries   Recent Labs Lab 02/24/17 0458 02/26/17 2316 02/27/17 0715  NA 135 133* 136  K 4.7 4.1 3.8  CL 110 103 105  CO2 21* 21* 24  GLUCOSE 129* 154* 144*  BUN 7 10 9   CREATININE 0.65 0.64 0.58  CALCIUM 7.7* 8.4* 8.3*  MG 2.0  --   --   AST  --  40  --   ALT  --  18  --   ALKPHOS  --  76  --   BILITOT  --  0.7  --    ------------------------------------------------------------------------------------------------------------------  Cardiac Enzymes  Recent Labs Lab 02/27/17 1234  TROPONINI <0.03   ------------------------------------------------------------------------------------------------------------------  RADIOLOGY:  Dg Chest Port 1 View  Result Date: 02/26/2017 CLINICAL DATA:  Shortness of breath after fall today after being discharged from the hospital for colitis. EXAM: PORTABLE CHEST 1 VIEW COMPARISON:  Chest CT 2 days prior 02/24/2017 FINDINGS: Again seen elevation of right hemidiaphragm. Small bilateral pleural effusions  appear grossly similar from CT. Unchanged heart size and mediastinal contours. No pneumothorax or focal airspace disease. No acute osseous abnormalities are seen. IMPRESSION: Chronic elevation of right hemidiaphragm with small bilateral  pleural effusions, stable from recent CT. No new abnormality. Electronically Signed   By: Jeb Levering M.D.   On: 02/26/2017 23:49     ASSESSMENT AND PLAN:   71 year old female with past medical history of collagen vascular disease, COPD, diabetes, hypertension, hyperlipidemia, osteoporosis, PTSD recent admission for C. difficile colitis,, history of brain tumor who presents to the hospital shortness of breath, weakness.  1. Acute respiratory failure with hypoxia-secondary to bronchitis/pneumonitis -Patient had CT scan of the chest on previous hospitalization which was negative for pulmonary embolism.  - cont. IV steroids, scheduled DuoNeb nebs, Pulmicort nebs.  Slowly improving. Assess for Home O2 prior to discharge.   2. Status post fall/generalized weakness-secondary to deconditioning and also underlying pulmonary illness. - Seen by PT and they recommend SNF/STR and social work aware.   3. C. difficile colitis-continue oral vancomycin, tolerating by mouth well. Diarrhea has improved.  4. Essential hypertension-continue clonidine, Imdur - BP stable.   5. Diabetes type 2 without complication-continue sliding scale insulin, Tradjenta. Blood sugar stable.  6. DM Neuropathy - cont. Lyrica.   7. GERD - cont. Protonix.   8. Depression/anxiety-continue Xanax, Lexapro.   All the records are reviewed and case discussed with Care Management/Social Worker. Management plans discussed with the patient, family and they are in agreement.  CODE STATUS: Full code  DVT Prophylaxis: Lovenox  TOTAL TIME TAKING CARE OF THIS PATIENT: 30 minutes.   POSSIBLE D/C IN 1-2 DAYS, DEPENDING ON CLINICAL CONDITION.   Henreitta Leber M.D on 02/28/2017 at 3:30 PM  Between 7am to 6pm - Pager - [phone]  After 6pm go to [email or web address] - password [email or web address] [facility]  Office  [phone]  CC: Primary care physician; Lavera Guise, MD

## 2017-02-28 NOTE — Progress Notes (Signed)
No significant change this shift,plan for discharge to rehab tomorrow

## 2017-02-28 NOTE — NC FL2 (Signed)
[place] LEVEL OF CARE SCREENING TOOL     IDENTIFICATION  [name] Birthdate: 28-Jan-1946 Sex: female Admission Date (Current Location): 02/26/2017  [place] and [place] Number:  Engineering geologist and Address:  [facility], [address] [place], [place] [address]      Provider Number: [id number]  Attending Physician Name and Address:  Henreitta Leber, MD  Relative Name and Phone Number:       Current Level of Care: Hospital Recommended Level of Care: [place] Prior Approval Number:    Date Approved/Denied:   PASRR Number:    Discharge Plan: SNF    Current Diagnoses: Patient Active Problem List   Diagnosis Date Noted  . Near syncope 02/27/2017  . Dehydration 02/27/2017  . Colitis 02/22/2017  . Seizure ([place]) 01/03/2016  . Chronic tension-type headache, intractable 12/04/2015  . 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 Degeneration of intervertebral disc of lumbar region 07/15/2015  . Seropositive rheumatoid arthritis ([place]) 07/15/2015  . Asthma with acute exacerbation 03/10/2015  . Hypokalemia 03/01/2015  . Hyponatremia 03/01/2015  . DDD (degenerative disc disease), lumbar 01/31/2015  . Arthritis, degenerative 01/31/2015  . Rheumatoid arthritis with rheumatoid factor ([place]) 01/31/2015  . Diabetes mellitus, type II ([place]) 12/24/2014  . HTN (hypertension) 12/24/2014  . Sepsis ([place]) 12/24/2014  . Left knee pain 12/24/2014  . GERD (gastroesophageal reflux disease) 12/24/2014  . COPD (chronic obstructive pulmonary disease) ([place]) 12/24/2014  . Depression 12/24/2014  . Anxiety 12/24/2014  . Severe bipolar disorder with psychotic features, mood-congruent ([place]) 11/16/2014  . Neurosis, posttraumatic 11/16/2014  . H/O diabetes mellitus 11/16/2014  . H/O: HTN (hypertension) 11/16/2014  . H/O gastric ulcer 11/16/2014  . H/O: obesity 11/16/2014  . H/O arthritis 11/16/2014  . Barton's fracture of  distal radius, closed 09/19/2014  . Neuritis or radiculitis due to rupture of lumbar intervertebral disc 05/11/2014  . Cervico-occipital neuralgia 03/26/2014  . Difficulty in walking 03/26/2014  . Difficulty in walking, not elsewhere classified 03/26/2014  . Cervical spine syndrome 01/26/2014  . Cephalalgia 01/09/2014  . Disordered sleep 01/09/2014  . BP (high blood pressure) 10/19/2013  . Adiposity 10/19/2013  . Cardiac murmur 10/19/2013  . PNA (pneumonia) 10/19/2013  . Breath shortness 10/19/2013  . Chronic obstructive pulmonary disease ([place]) 10/19/2013  . Diabetes mellitus ([place]) 10/19/2013    Orientation RESPIRATION BLADDER Height & Weight     Self, Time, Situation, Place  Normal Continent Weight: 142 lb 3.2 oz (64.5 kg) Height:  4\' 8"  (142.2 cm)  BEHAVIORAL SYMPTOMS/MOOD NEUROLOGICAL BOWEL NUTRITION STATUS      Continent Diet (Heart Healthy/Carb modified)  AMBULATORY STATUS COMMUNICATION OF NEEDS Skin   Extensive Assist Verbally Normal                       Personal Care Assistance Level of Assistance  Bathing, Feeding, Dressing Bathing Assistance: Limited assistance Feeding assistance: Independent Dressing Assistance: Limited assistance     Functional Limitations Info             SPECIAL CARE FACTORS FREQUENCY  PT (By licensed PT)     PT Frequency: Up to 5X per day, 5 days per week              Contractures      Additional Factors Info  Allergies   Allergies Info: Gabapentin, Naproxen, Nsaids, Tramadol           Current Medications (02/28/2017):  This is the  current hospital active medication list Current Facility-Administered Medications  Medication Dose Route Frequency Provider Last Rate Last Dose  . acetaminophen (TYLENOL) tablet 650 mg  650 mg Oral Q6H PRN Saundra Shelling, MD   650 mg at 02/28/17 1243   Or  . acetaminophen (TYLENOL) suppository 650 mg  650 mg Rectal Q6H PRN Pyreddy, Reatha Harps, MD      . budesonide (PULMICORT) nebulizer  solution 0.5 mg  0.5 mg Nebulization BID Henreitta Leber, MD   0.5 mg at 02/28/17 0813  . cloNIDine (CATAPRES) tablet 0.1 mg  0.1 mg Oral BID Saundra Shelling, MD   0.1 mg at 02/28/17 1035  . enoxaparin (LOVENOX) injection 40 mg  40 mg Subcutaneous Q24H Saundra Shelling, MD   40 mg at 02/28/17 0535  . escitalopram (LEXAPRO) tablet 10 mg  10 mg Oral q morning - 10a Pyreddy, Reatha Harps, MD   10 mg at 02/28/17 1042  . insulin aspart (novoLOG) injection 0-5 Units  0-5 Units Subcutaneous QHS Sainani, Vivek J, MD      . insulin aspart (novoLOG) injection 0-9 Units  0-9 Units Subcutaneous TID WC Henreitta Leber, MD   1 Units at 02/28/17 1227  . ipratropium-albuterol (DUONEB) 0.5-2.5 (3) MG/3ML nebulizer solution 3 mL  3 mL Nebulization BID Henreitta Leber, MD      . lamoTRIgine (LAMICTAL) tablet 100 mg  100 mg Oral Daily Pyreddy, Reatha Harps, MD   100 mg at 02/28/17 1035  . linagliptin (TRADJENTA) tablet 5 mg  5 mg Oral Daily Pyreddy, Pavan, MD   5 mg at 02/28/17 1035  . methylPREDNISolone sodium succinate (SOLU-MEDROL) 40 mg/mL injection 40 mg  40 mg Intravenous Q12H Henreitta Leber, MD   40 mg at 02/28/17 1015  . montelukast (SINGULAIR) tablet 10 mg  10 mg Oral Daily Pyreddy, Reatha Harps, MD   10 mg at 02/28/17 1035  . nortriptyline (PAMELOR) capsule 10 mg  10 mg Oral QHS Saundra Shelling, MD   10 mg at 02/27/17 2223  . ondansetron (ZOFRAN) tablet 4 mg  4 mg Oral Q6H PRN Pyreddy, Reatha Harps, MD       Or  . ondansetron (ZOFRAN) injection 4 mg  4 mg Intravenous Q6H PRN Pyreddy, Pavan, MD      . pantoprazole (PROTONIX) EC tablet 40 mg  40 mg Oral Daily Pyreddy, Reatha Harps, MD   40 mg at 02/28/17 1035  . pramipexole (MIRAPEX) tablet 0.25 mg  0.25 mg Oral TID Saundra Shelling, MD   0.25 mg at 02/28/17 1035  . pregabalin (LYRICA) capsule 50 mg  50 mg Oral BID Saundra Shelling, MD   50 mg at 02/28/17 1035  . sodium chloride flush (NS) 0.9 % injection 3 mL  3 mL Intravenous Q12H Pyreddy, Reatha Harps, MD   3 mL at 02/28/17 1038  . topiramate  (TOPAMAX) tablet 25 mg  25 mg Oral BID Saundra Shelling, MD   25 mg at 02/28/17 1042  . vancomycin (VANCOCIN) 50 mg/mL oral solution 125 mg  125 mg Oral Q6H Pyreddy, Reatha Harps, MD   125 mg at 02/28/17 1226     Discharge Medications: Please see discharge summary for a list of discharge medications.  Relevant Imaging Results:  Relevant Lab Results:   Additional Information SS# [id number]  Zettie Pho, LCSW

## 2017-03-01 LAB — GLUCOSE, CAPILLARY
GLUCOSE-CAPILLARY: 117 mg/dL — AB (ref 65–99)
Glucose-Capillary: 111 mg/dL — ABNORMAL HIGH (ref 65–99)
Glucose-Capillary: 141 mg/dL — ABNORMAL HIGH (ref 65–99)
Glucose-Capillary: 144 mg/dL — ABNORMAL HIGH (ref 65–99)
Glucose-Capillary: 172 mg/dL — ABNORMAL HIGH (ref 65–99)

## 2017-03-01 MED ORDER — METOPROLOL TARTRATE 25 MG PO TABS
25.0000 mg | ORAL_TABLET | Freq: Two times a day (BID) | ORAL | Status: DC
  Administered 2017-03-01 – 2017-03-04 (×6): 25 mg via ORAL
  Filled 2017-03-01 (×7): qty 1

## 2017-03-01 MED ORDER — ALPRAZOLAM 0.5 MG PO TABS
0.5000 mg | ORAL_TABLET | Freq: Two times a day (BID) | ORAL | Status: DC | PRN
  Administered 2017-03-01 – 2017-03-03 (×2): 0.5 mg via ORAL
  Filled 2017-03-01 (×2): qty 1

## 2017-03-01 NOTE — Progress Notes (Signed)
[facility] [place] NAME: Meghan [name]    MR#:  [id number]  DATE OF BIRTH:  1946/05/15  SUBJECTIVE:  Emotional - not wanting to go to rehab although agrees after discussion REVIEW OF SYSTEMS:    Review of Systems  Constitutional: Negative for chills and fever.  HENT: Negative for congestion and tinnitus.   Eyes: Negative for blurred vision and double vision.  Respiratory: Positive for shortness of breath. Negative for cough and wheezing.   Cardiovascular: Negative for chest pain, orthopnea and PND.  Gastrointestinal: Negative for abdominal pain, diarrhea, nausea and vomiting.  Genitourinary: Negative for dysuria and hematuria.  Neurological: Positive for weakness (Generalized). Negative for dizziness, sensory change and focal weakness.  All other systems reviewed and are negative.   Nutrition: Heart healthy Tolerating Diet: Yes Tolerating PT: Eval noted.   DRUG ALLERGIES:   Allergies  Allergen Reactions  . Gabapentin Other (See Comments)    Pt states that it causes her BP to drop.   . Naproxen Hives  . Nsaids Other (See Comments)    Reaction:  Unknown   . Tramadol Itching    VITALS:  Blood pressure (!) 133/96, pulse (!) 124, temperature 98 F (36.7 C), temperature source Oral, resp. rate 16, height 4\' 8"  (1.422 m), weight 64.4 kg (142 lb), SpO2 98 %.  PHYSICAL EXAMINATION:   Physical Exam  GENERAL:  71 y.o.-year-old obese patient lying in bed Tearful at times but in NAD.  EYES: Pupils equal, round, reactive to light and accommodation. No scleral icterus. Extraocular muscles intact.  HEENT: Head atraumatic, normocephalic. Oropharynx and nasopharynx clear.  NECK:  Supple, no jugular venous distention. No thyroid enlargement, no tenderness.  LUNGS: Prolonged Insp. & exp.phase, minimal wheezing b/l, No rales, rhonchi. No use of accessory muscles of respiration.  CARDIOVASCULAR: S1, S2 normal. No murmurs, rubs, or gallops.   ABDOMEN: Soft, nontender, nondistended. Bowel sounds present. No organomegaly or mass.  EXTREMITIES: No cyanosis, clubbing or edema b/l.    NEUROLOGIC: Cranial nerves II through XII are intact. No focal Motor or sensory deficits b/l.  Globally weak PSYCHIATRIC: The patient is alert and oriented x 3. Depressed? SKIN: No obvious rash, lesion, or ulcer.  LABORATORY PANEL:   CBC  Recent Labs Lab 02/28/17 0627  WBC 4.6  HGB 11.4*  HCT 33.2*  PLT 274   ------------------------------------------------------------------------------------------------------------------  Chemistries   Recent Labs Lab 02/24/17 0458 02/26/17 2316 02/27/17 0715  NA 135 133* 136  K 4.7 4.1 3.8  CL 110 103 105  CO2 21* 21* 24  GLUCOSE 129* 154* 144*  BUN 7 10 9   CREATININE 0.65 0.64 0.58  CALCIUM 7.7* 8.4* 8.3*  MG 2.0  --   --   AST  --  40  --   ALT  --  18  --   ALKPHOS  --  76  --   BILITOT  --  0.7  --    ------------------------------------------------------------------------------------------------------------------  Cardiac Enzymes  Recent Labs Lab 02/27/17 1234  TROPONINI <0.03   ------------------------------------------------------------------------------------------------------------------  RADIOLOGY:  No results found.   ASSESSMENT AND PLAN:   71 year old female with past medical history of collagen vascular disease, COPD, diabetes, hypertension, hyperlipidemia, osteoporosis, PTSD recent admission for C. difficile colitis,, history of brain tumor who presents to the hospital shortness of breath, weakness.  1. Acute respiratory failure with hypoxia-secondary to bronchitis/pneumonitis -Patient had CT scan of the chest on previous hospitalization which was negative for pulmonary embolism.  -  cont. IV steroids, scheduled DuoNeb nebs, Pulmicort nebs.  Slowly improving.   * Sinus Tach - will add metoprolol for rate control with some holding parameters. C/s cardio  2. Status  post fall/generalized weakness-secondary to deconditioning and also underlying pulmonary illness. - Seen by PT and they recommend SNF/STR and social work aware.   3. C. difficile colitis-continue oral vancomycin, tolerating by mouth well. Diarrhea has improved.  4. Essential hypertension-continue clonidine, Imdur - BP stable.   5. Diabetes type 2 without complication-continue sliding scale insulin, Tradjenta. Blood sugar stable.  6. DM Neuropathy - cont. Lyrica.   7. GERD - cont. Protonix.   8. Depression/anxiety-continue Xanax, Lexapro.   All the records are reviewed and case discussed with Care Management/Social Worker. Management plans discussed with the patient, nursing and they are in agreement.  CODE STATUS: Full code  DVT Prophylaxis: Lovenox  TOTAL TIME TAKING CARE OF THIS PATIENT: 30 minutes.   POSSIBLE D/C tomorrow, DEPENDING ON CLINICAL CONDITION.   Max Sane M.D on 03/01/2017 at 7:36 PM  Between 7am to 6pm - Pager - [phone]  After 6pm go to [email or web address] - password [email or web address] [place] [facility]  Office  [phone]  CC: Primary care physician; Lavera Guise, MD

## 2017-03-02 ENCOUNTER — Inpatient Hospital Stay: Payer: Medicare Other

## 2017-03-02 LAB — BASIC METABOLIC PANEL
Anion gap: 11 (ref 5–15)
BUN: 24 mg/dL — AB (ref 6–20)
CALCIUM: 8.8 mg/dL — AB (ref 8.9–10.3)
CO2: 23 mmol/L (ref 22–32)
Chloride: 100 mmol/L — ABNORMAL LOW (ref 101–111)
Creatinine, Ser: 0.71 mg/dL (ref 0.44–1.00)
GFR calc Af Amer: >60 mL/min (ref >60)
Glucose, Bld: 143 mg/dL — ABNORMAL HIGH (ref 65–99)
POTASSIUM: 4.1 mmol/L (ref 3.5–5.1)
SODIUM: 134 mmol/L — AB (ref 135–145)

## 2017-03-02 LAB — CBC
HCT: 37.4 % (ref 35.0–47.0)
Hemoglobin: 12.5 g/dL (ref 12.0–16.0)
MCH: 29.9 pg (ref 26.0–34.0)
MCHC: 33.5 g/dL (ref 32.0–36.0)
MCV: 89.3 fL (ref 80.0–100.0)
PLATELETS: 331 10*3/uL (ref 150–440)
RBC: 4.19 MIL/uL (ref 3.80–5.20)
RDW: 15.9 % — ABNORMAL HIGH (ref 11.5–14.5)
WBC: 5.8 10*3/uL (ref 3.6–11.0)

## 2017-03-02 LAB — GLUCOSE, CAPILLARY
GLUCOSE-CAPILLARY: 139 mg/dL — AB (ref 65–99)
GLUCOSE-CAPILLARY: 128 mg/dL — AB (ref 65–99)
GLUCOSE-CAPILLARY: 131 mg/dL — AB (ref 65–99)
Glucose-Capillary: 142 mg/dL — ABNORMAL HIGH (ref 65–99)
Glucose-Capillary: 137 mg/dL — ABNORMAL HIGH (ref 65–99)

## 2017-03-02 MED ORDER — PREDNISONE 10 MG (21) PO TBPK: ORAL_TABLET | ORAL | Status: DC

## 2017-03-02 MED ORDER — BUDESONIDE 0.5 MG/2ML IN SUSP: 0.5000 mg | Freq: Two times a day (BID) | RESPIRATORY_TRACT | 12 refills | Status: DC

## 2017-03-02 MED ORDER — METOPROLOL TARTRATE 25 MG PO TABS: 25.0000 mg | ORAL_TABLET | Freq: Two times a day (BID) | ORAL | Status: DC

## 2017-03-02 MED ORDER — ALPRAZOLAM 0.5 MG PO TABS: 0.5000 mg | ORAL_TABLET | Freq: Two times a day (BID) | ORAL | 0 refills | Status: DC | PRN

## 2017-03-02 MED ORDER — VANCOMYCIN 50 MG/ML ORAL SOLUTION: 125.0000 mg | Freq: Four times a day (QID) | ORAL | Status: AC

## 2017-03-02 NOTE — Consult Note (Signed)
[place]  CARDIOLOGY CONSULT NOTE  Patient ID: LOLITHA TORTORA MRN: [id number] DOB/AGE: [birth year] 71 y.o.  Admit date: 02/26/2017 Referring Physician Dr. Manuella Ghazi Primary Physician Dr. Clayborn Bigness Primary Cardiologist Dr. Clayborn Bigness Reason for Consultation sinus tachycardia  HPI: Patient is a 71 year old female with history of recent admission for C. difficile colitis, history of COPD, history of coronary artery disease with a negative functional study as an outpatient in the recent past who was admitted with progressive shortness of breath 12 hours after being discharged to home from her C. difficile colitis episode. Patient states she felt weak. She lost her balance and fell. In the emergency room she was noted to have sinus tachycardia with evidence of volume depletion. She was given IV fluids. Her serum lactate was elevated she had currently been treated with oral vancomycin for her C. difficile. Her diarrhea has improved. EKG on admission revealed sinus tachycardia at a rate of 100.828 with incomplete bundle branch block. She ruled out for myocardial infarction as her cardiac markers were normal. Telemetry revealed persistent sinus rhythm to sinus tachycardia with rates between upper 90s and 120s to 130s. Her heart rate increases with activity and with bronchodilators. Chest x-ray on admission revealed chronic elevation of the right hemidiaphragm with small bilateral pleural effusions with no pulmonary edema. Renal function has remained stable. She is somewhat anxious and resistant to being discharged to rehabilitation after this admission. She has been placed on metoprolol which has improved her heart rate somewhat. Blood pressure is been treated with clonidine 0.1 mg twice daily.  Review of Systems  HENT: Negative.   Eyes: Negative.   Respiratory: Positive for shortness of breath.   Cardiovascular: Positive for palpitations.  Gastrointestinal:  Negative.   Genitourinary: Negative.   Musculoskeletal: Negative.   Skin: Negative.   Neurological: Positive for weakness.  Endo/Heme/Allergies: Negative.   Psychiatric/Behavioral: The patient is nervous/anxious.     Past Medical History:  Diagnosis Date  . Anginal pain ([place])   . Anxiety   . Arthritis    RA  . Asthma   . Brain tumor (benign) ([place])   . CHF (congestive heart failure) ([place])   . Collagen vascular disease ([place])   . COPD (chronic obstructive pulmonary disease) ([place])   . Coronary artery disease   . DDD (degenerative disc disease)   . Depression   . Diabetes mellitus   . GERD (gastroesophageal reflux disease)   . Headache   . Heart murmur   . Heart murmur   . Hypercholesteremia   . Hypertension   . Lumbar degenerative disc disease   . Migraines   . Obese   . Obesity   . Osteopenia   . Osteoporosis   . Pneumonia   . PTSD (post-traumatic stress disorder)     Family History  Problem Relation Age of Onset  . Depression Sister   . Hypertension Mother   . Arthritis/Rheumatoid Mother   . Heart attack Father   . Hypertension Father   . CAD Unknown   . Hypertension Unknown   . Diabetes Mellitus II Unknown   . Arthritis Unknown     Social History   Social History  . Marital status: Divorced    Spouse name: N/A  . Number of children: N/A  . Years of education: N/A   Occupational History  . retired    Social History Main Topics  . Smoking status: Never Smoker  . Smokeless tobacco: Never Used  .  Alcohol use No  . Drug use: No  . Sexual activity: No   Other Topics Concern  . Not on file   Social History Narrative  . No narrative on file    Past Surgical History:  Procedure Laterality Date  . ABDOMINAL HYSTERECTOMY    . CERVICAL FUSION    . CHOLECYSTECTOMY    . COLONOSCOPY WITH PROPOFOL N/A 09/20/2015   Procedure: COLONOSCOPY WITH PROPOFOL;  Surgeon: Josefine Class, MD;  Location: [facility] ENDOSCOPY;  Service: Endoscopy;  Laterality: N/A;  .  COLONOSCOPY WITH PROPOFOL N/A 01/27/2016   Procedure: COLONOSCOPY WITH PROPOFOL;  Surgeon: Manya Silvas, MD;  Location: [facility] ENDOSCOPY;  Service: Endoscopy;  Laterality: N/A;  . FINGER ARTHROPLASTY  02/23/2012   Procedure: FINGER ARTHROPLASTY;  Surgeon: Cammie Sickle., MD;  Location: [facility];  Service: Orthopedics;  Laterality: Left;  Extensor carpi radialis longus to Extensor carpi ulnaris transfer, left Metaphalangeal reconstructions of index and long fingers,  . FOOT ARTHROPLASTY     toes x2 rt foot  . HAND RECONSTRUCTION  2011   right-multiple finger joint reconst  . JOINT REPLACEMENT     bilat knee replacements     Prescriptions Prior to Admission  Medication Sig Dispense Refill Last Dose  . albuterol (PROVENTIL HFA;VENTOLIN HFA) 108 (90 BASE) MCG/ACT inhaler Inhale 2 puffs into the lungs every 6 (six) hours as needed for wheezing or shortness of breath.    prn at prn  . cloNIDine (CATAPRES) 0.1 MG tablet Take 0.1 mg by mouth 2 (two) times daily.   02/22/2017 at 0800  . diphenoxylate-atropine (LOMOTIL) 2.5-0.025 MG tablet Take 1 tablet by mouth 3 (three) times daily as needed for diarrhea or loose stools.   02/22/2017 at 0800  . escitalopram (LEXAPRO) 10 MG tablet Take 1 tablet (10 mg total) by mouth every morning. 90 tablet 1 02/22/2017 at 0800  . esomeprazole (NEXIUM) 40 MG capsule Take 40 mg by mouth at bedtime.   02/22/2017 at 0800  . insulin starter kit- pen needles MISC 1 kit by Other route once. 1 kit 0 02/22/2017 at Unknown time  . isosorbide mononitrate (IMDUR) 30 MG 24 hr tablet Take 30 mg by mouth daily.   02/22/2017 at 0800  . lamoTRIgine (LAMICTAL) 100 MG tablet Take 1 tablet (100 mg total) by mouth daily. 90 tablet 3 02/22/2017 at 0800  . meloxicam (MOBIC) 7.5 MG tablet daily.    02/22/2017 at 0800  . montelukast (SINGULAIR) 10 MG tablet Take 10 mg by mouth daily.   02/22/2017 at 0800  . nortriptyline (PAMELOR) 10 MG capsule Take 10 mg by mouth at bedtime.    02/21/2017 at 2200  . ondansetron (ZOFRAN) 4 MG tablet Take 4 mg by mouth 3 (three) times daily.   prn at prn  . ONE TOUCH ULTRA TEST test strip    02/22/2017 at Unknown time  . pramipexole (MIRAPEX) 0.25 MG tablet Take 0.25 mg by mouth.   02/21/2017 at 2200  . predniSONE (DELTASONE) 5 MG tablet Take 5 mg by mouth daily.    02/22/2017 at 0800  . pregabalin (LYRICA) 50 MG capsule TAKE ONE CAPSULE BY MOUTH TWICE A DAY   02/22/2017 at 0800  . saxagliptin HCl (ONGLYZA) 5 MG TABS tablet Take 5 mg by mouth at bedtime.   02/22/2017 at 0800  . Syringe, Disposable, 1 ML MISC 300 Syringes by Does not apply route daily. 300 each 0 02/22/2017 at Unknown time  . topiramate (TOPAMAX) 25  MG tablet Take 25 mg by mouth 2 (two) times daily.   02/21/2017 at 2200  . vancomycin (VANCOCIN) 50 mg/mL oral solution Take 2.5 mLs (125 mg total) by mouth every 6 (six) hours. 100 mL 0     Physical Exam: Blood pressure 101/79, pulse (!) 102, temperature 97.6 F (36.4 C), temperature source Oral, resp. rate 18, height 4' 8"  (1.422 m), weight 66.5 kg (146 lb 8 oz), SpO2 99 %.   Wt Readings from Last 1 Encounters:  03/02/17 66.5 kg (146 lb 8 oz)     General appearance: cooperative Head: Normocephalic, without obvious abnormality, atraumatic Resp: clear to auscultation bilaterally Chest wall: no tenderness Cardio: Tachycardic GI: soft, non-tender; bowel sounds normal; no masses,  no organomegaly Extremities: extremities normal, atraumatic, no cyanosis or edema Neurologic: Grossly normal  Labs:   Lab Results  Component Value Date   WBC 5.8 03/02/2017   HGB 12.5 03/02/2017   HCT 37.4 03/02/2017   MCV 89.3 03/02/2017   PLT 331 03/02/2017    Recent Labs Lab 02/26/17 2316  03/02/17 0621  NA 133*  < > 134*  K 4.1  < > 4.1  CL 103  < > 100*  CO2 21*  < > 23  BUN 10  < > 24*  CREATININE 0.64  < > 0.71  CALCIUM 8.4*  < > 8.8*  PROT 5.2*  --   --   BILITOT 0.7  --   --   ALKPHOS 76  --   --   ALT 18  --   --   AST  40  --   --   GLUCOSE 154*  < > 143*  < > = values in this interval not displayed. Lab Results  Component Value Date   CKTOTAL 95 11/29/2012   CKMB 2.6 11/29/2012   TROPONINI <0.03 02/27/2017      Radiology: Chronic elevation of the right hemidiaphragm with small bilateral pleural effusions. These have not worsened since recent CT and chest x-ray from previous admission. EKG: Sinus tachycardia with incomplete bundle branch block  ASSESSMENT AND PLAN:  Patient is a 71 year old female with history of recent admission for C. difficile like this treated with vancomycin. A lot of diarrhea with that previous admission however this is. She was discharged and returned to the hospital approximately 12 hours after discharge complaints of weakness and fatigue with shortness of breath. She was felt to be mildly volume depleted. Chest x-ray revealed no pulmonary edema Electrocardiogram revealed sinus tachycardia with no change. She ruled out for myocardial infarction. Her heart rate remained somewhat elevated although appears to be consistently sinus tachycardia responding to stressful situations. Recommendations were for her to be discharged to a rehabilitation facility which she is somewhat resistant to. She is on clonidine at 0.1 mg twice daily for blood pressure and was recently placed on metoprolol tartrate to improve her rate control. Her blood pressure is relatively soft on this regimen. We'll consider discontinuing clonidine and continue with metoprolol. Her sinus tachycardia appears to be secondary to her current medical situation does not appear to represent a pathologic tachycardia. She does not appear to be ischemic and had a negative functional study with a normal LV function in January of this year. Would ambulate on metoprolol and clonidine stable okay for discharge from cardiac standpoint with early follow-up per Dr. Clayborn Bigness as an inpatient. Signed: Teodoro Spray MD, [facility] 03/02/2017, 7:43  AM

## 2017-03-02 NOTE — Clinical Social Work Note (Signed)
CSW has sent requested clinicals to [name] due to patient being a level 2 screen.  A [facility] has been assigned to patient's case, awaiting [name] number.  Patient can not discharge to SNF until [name] number has been received.  CSW to continue to follow patient's progress throughout discharge planning.  Jones Broom. Hayes, MSW, [place]

## 2017-03-02 NOTE — Clinical Social Work Note (Addendum)
CSW spoke to patient's grandaughter [name], who is requesting [facility] for SNF placement.  CSW contacted [facility] who said they can accept patient once the [name] number has been received.  3:45pm  [facility] for [name] arrived to evaluate patient awaiting [name] assessment.  Jones Broom. [place], MSW, [place]  03/02/2017 2:59 PM

## 2017-03-02 NOTE — Care Management Important Message (Signed)
Important Message  Patient Details  Name: Meghan [name] MRN: [id number] Date of Birth: 08-02-1946   Medicare Important Message Given:  Yes Signed IM notice given    Katrina Stack, RN 03/02/2017, 12:15 PM

## 2017-03-02 NOTE — Discharge Summary (Addendum)
Meghan [name] NAME: Meghan [name]    MR#:  [id number]  DATE OF BIRTH:  04-15-1946  DATE OF ADMISSION:  02/26/2017   ADMITTING PHYSICIAN: Saundra Shelling, MD  DATE OF DISCHARGE: 03/04/2017  PRIMARY CARE PHYSICIAN: Lavera Guise, MD   ADMISSION DIAGNOSIS:  Shortness of breath [R06.02] SOB (shortness of breath) [R06.02] Tachycardia [R00.0] Elevated lactic acid level [R79.89] Volume depletion [E86.9] Dehydration [E86.0] DISCHARGE DIAGNOSIS:  Active Problems:   Near syncope   Dehydration  SECONDARY DIAGNOSIS:   Past Medical History:  Diagnosis Date  . Anginal pain ([place])   . Anxiety   . Arthritis    RA  . Asthma   . Brain tumor (benign) ([place])   . CHF (congestive heart failure) ([place])   . Collagen vascular disease ([place])   . COPD (chronic obstructive pulmonary disease) ([place])   . Coronary artery disease   . DDD (degenerative disc disease)   . Depression   . Diabetes mellitus   . GERD (gastroesophageal reflux disease)   . Headache   . Heart murmur   . Heart murmur   . Hypercholesteremia   . Hypertension   . Lumbar degenerative disc disease   . Migraines   . Obese   . Obesity   . Osteopenia   . Osteoporosis   . Pneumonia   . PTSD (post-traumatic stress disorder)    HOSPITAL COURSE:  71 year old female with past medical history of collagen vascular disease, COPD, diabetes, hypertension, hyperlipidemia, osteoporosis, PTSD recent admission for C. difficile colitis,, history of brain tumor admitted forshortness of breath, weakness.  1. Acute respiratory failure with hypoxia-secondary to bronchitis/pneumonitis -Patient had CT scan of the chest on previous hospitalization which was negative for pulmonary embolism.  - Steroid stopped upon discharge - Continue nebulizers with budesonide and duoneb     * Sinus Tach - Discharged on metoprolol for rate control with some holding parameters. Stop clonidine as BP is soft  2.  Status post fall/generalized weakness-secondary to deconditioning and also underlying pulmonary illness. - Seen by PT and they recommend SNF/STR and she is reluctantly agrees.  3. C. difficile colitis-continue oral vancomycin, tolerating by mouth well. Diarrhea has improved.  4. Essential hypertension-continue Imdur, added metoprolol  5. Oral thrush- start nystatin swish and swallow  6. Continue psych meds  She is reluctantly agreeable to go to rehabilitation.  She has extremely poor prognosis and high risk for readmission. DISCHARGE CONDITIONS:  stable CONSULTS OBTAINED:  Treatment Team:  Teodoro Spray, MD DRUG ALLERGIES:   Allergies  Allergen Reactions  . Gabapentin Other (See Comments)    Pt states that it causes her BP to drop.   . Naproxen Hives  . Nsaids Other (See Comments)    Reaction:  Unknown   . Tramadol Itching   DISCHARGE MEDICATIONS:   Allergies as of 03/04/2017      Reactions   Gabapentin Other (See Comments)   Pt states that it causes her BP to drop.    Naproxen Hives   Nsaids Other (See Comments)   Reaction:  Unknown    Tramadol Itching      Medication List    STOP taking these medications   albuterol 108 (90 Base) MCG/ACT inhaler Commonly known as:  PROVENTIL HFA;VENTOLIN HFA   cloNIDine 0.1 MG tablet Commonly known as:  CATAPRES   predniSONE 5 MG tablet Commonly known as:  DELTASONE     TAKE these medications   ALPRAZolam  0.5 MG tablet Commonly known as:  XANAX Take 1 tablet (0.5 mg total) by mouth 2 (two) times daily as needed for anxiety.   budesonide 0.5 MG/2ML nebulizer solution Commonly known as:  PULMICORT Take 2 mLs (0.5 mg total) by nebulization 2 (two) times daily.   diphenoxylate-atropine 2.5-0.025 MG tablet Commonly known as:  LOMOTIL Take 1 tablet by mouth 3 (three) times daily as needed for diarrhea or loose stools.   escitalopram 10 MG tablet Commonly known as:  LEXAPRO Take 1 tablet (10 mg total) by mouth every  morning.   esomeprazole 40 MG capsule Commonly known as:  NEXIUM Take 40 mg by mouth at bedtime.   insulin starter kit- pen needles Misc 1 kit by Other route once.   ipratropium-albuterol 0.5-2.5 (3) MG/3ML Soln Commonly known as:  DUONEB Take 3 mLs by nebulization 2 (two) times daily.   isosorbide mononitrate 30 MG 24 hr tablet Commonly known as:  IMDUR Take 30 mg by mouth daily.   lamoTRIgine 100 MG tablet Commonly known as:  LAMICTAL Take 1 tablet (100 mg total) by mouth daily.   LYRICA 50 MG capsule Generic drug:  pregabalin TAKE ONE CAPSULE BY MOUTH TWICE A DAY   meloxicam 7.5 MG tablet Commonly known as:  MOBIC daily.   metoprolol tartrate 25 MG tablet Commonly known as:  LOPRESSOR Take 1 tablet (25 mg total) by mouth 2 (two) times daily.   montelukast 10 MG tablet Commonly known as:  SINGULAIR Take 10 mg by mouth daily.   nortriptyline 10 MG capsule Commonly known as:  PAMELOR Take 10 mg by mouth at bedtime.   nystatin 100000 UNIT/ML suspension Commonly known as:  MYCOSTATIN Take 5 mLs (500,000 Units total) by mouth 4 (four) times daily.   ondansetron 4 MG tablet Commonly known as:  ZOFRAN Take 4 mg by mouth 3 (three) times daily.   ONE TOUCH ULTRA TEST test strip Generic drug:  glucose blood   ONGLYZA 5 MG Tabs tablet Generic drug:  saxagliptin HCl Take 5 mg by mouth at bedtime.   pramipexole 0.25 MG tablet Commonly known as:  MIRAPEX Take 0.25 mg by mouth.   Syringe (Disposable) 1 ML Misc 300 Syringes by Does not apply route daily.   topiramate 25 MG tablet Commonly known as:  TOPAMAX Take 25 mg by mouth 2 (two) times daily.   vancomycin 50 mg/mL oral solution Commonly known as:  VANCOCIN Take 2.5 mLs (125 mg total) by mouth every 6 (six) hours. 7 More days What changed:  additional instructions      DISCHARGE INSTRUCTIONS:   DIET:  Cardiac diet DISCHARGE CONDITION:  Fair ACTIVITY:  Activity as tolerated OXYGEN:  Home Oxygen:  No.  Oxygen Delivery: room air DISCHARGE LOCATION:  nursing home - palliative care evaluation while at facility  If you experience worsening of your admission symptoms, develop shortness of breath, life threatening emergency, suicidal or homicidal thoughts you must seek medical attention immediately by calling 911 or calling your MD immediately  if symptoms less severe.  You Must read complete instructions/literature along with all the possible adverse reactions/side effects for all the Medicines you take and that have been prescribed to you. Take any new Medicines after you have completely understood and accpet all the possible adverse reactions/side effects.   Please note  You were cared for by a hospitalist during your hospital stay. If you have any questions about your discharge medications or the care you received while you were in the  hospital after you are discharged, you can call the unit and asked to speak with the hospitalist on call if the hospitalist that took care of you is not available. Once you are discharged, your primary care physician will handle any further medical issues. Please note that NO REFILLS for any discharge medications will be authorized once you are discharged, as it is imperative that you return to your primary care physician (or establish a relationship with a primary care physician if you do not have one) for your aftercare needs so that they can reassess your need for medications and monitor your lab values.    On the day of Discharge:  VITAL SIGNS:  Blood pressure (!) 103/55, pulse 80, temperature 97.8 F (36.6 C), resp. rate 16, height 4' 8"  (1.422 m), weight 65.3 kg (143 lb 14.4 oz), SpO2 94 %. PHYSICAL EXAMINATION:  GENERAL:  71 y.o.-year-old patient lying in the bed with no acute distress.  EYES: Pupils equal, round, reactive to light and accommodation. No scleral icterus. Extraocular muscles intact.  HEENT: Head atraumatic, normocephalic. Oropharynx and  nasopharynx clear.  NECK:  Supple, no jugular venous distention. No thyroid enlargement, no tenderness.  LUNGS: Normal breath sounds bilaterally, no wheezing, rales,rhonchi or crepitation. No use of accessory muscles of respiration.  CARDIOVASCULAR: S1, S2 normal. No murmurs, rubs, or gallops.  ABDOMEN: Soft, non-tender, non-distended. Bowel sounds present. No organomegaly or mass.  EXTREMITIES: No pedal edema, cyanosis, or clubbing.  NEUROLOGIC: Cranial nerves II through XII are intact. Muscle strength 5/5 in all extremities. Sensation intact. Gait not checked.  PSYCHIATRIC: The patient is alert and oriented x 3.  SKIN: No obvious rash, lesion, or ulcer.  DATA REVIEW:   CBC  Recent Labs Lab 03/02/17 0621  WBC 5.8  HGB 12.5  HCT 37.4  PLT 331    Chemistries   Recent Labs Lab 02/26/17 2316  03/02/17 0621  NA 133*  < > 134*  K 4.1  < > 4.1  CL 103  < > 100*  CO2 21*  < > 23  GLUCOSE 154*  < > 143*  BUN 10  < > 24*  CREATININE 0.64  < > 0.71  CALCIUM 8.4*  < > 8.8*  AST 40  --   --   ALT 18  --   --   ALKPHOS 76  --   --   BILITOT 0.7  --   --   < > = values in this interval not displayed.   Microbiology Results : Stool for C. difficile positive   Contact information for follow-up providers    Lavera Guise, MD. Go on 03/08/2017.   Specialty:  Internal Medicine Why:  Appointment Time: 9:30am Contact information: [place] [id number] [phone]            Contact information for after-discharge care    [facility] .   Specialty:  Skilled Nursing Facility Contact information: [place] [phone]                 Management plans discussed with the patient, Nursing and they are in agreement.   She is at very high risk for readmission  CODE STATUS: Full Code   TOTAL TIME TAKING CARE OF THIS PATIENT: 33 minutes.    Loletha Grayer M.D on 03/04/2017 at  12:22 PM  Between 7am to 6pm - Pager - [phone]  After 6pm go to [email or web address] -  password [email or web address] [facility]  [facility]  Office  [phone]  CC: Primary care physician; Lavera Guise, MD   Note: This dictation was prepared with Dragon dictation along with smaller phrase technology. Any transcriptional errors that result from this process are unintentional.

## 2017-03-02 NOTE — Discharge Instructions (Signed)
Clostridium Difficile Infection   Clostridium difficile (C. difficile or C. diff) infection causes inflammation of the large intestine (colon). This condition can result in damage to the lining of your colon and may lead to another condition called colitis. This infection can be passed from person to person (is contagious).  Follow these instructions at home:  Eating and drinking   · Drink enough fluid to keep your pee (urine) clear or pale yellow.  · Avoid drinking:  ? Milk.  ? Caffeine.  ? Alcohol.  · Follow exact instructions from your doctor about how to get enough fluid in your body (rehydrate).  · Eat small meals often instead of large meals.  Medicines   · Take your antibiotic medicine as told by your doctor. Do not stop taking the antibiotic even if you start to feel better unless your doctor told you to do that.  · Take over-the-counter and prescription medicines only as told by your doctor.  · Do not use medicines to help with watery poop (diarrhea).  General instructions   · Wash your hands fully before you prepare food and after you use the bathroom. Make sure people who live with you also wash their  · hands often.  · Clean the surfaces that you touch. Use a product that contains chlorine bleach.  · Keep all follow-up visits as told by your doctor. This is important.  Contact a doctor if:  · Your symptoms do not get better with treatment.  · Your symptoms get worse with treatment.  · Your symptoms go away and then come back.  · You have a fever.  · You have new symptoms.  Get help right away if:  · You have more pain or tenderness in your belly (abdomen).  · Your poop (stool) is mostly bloody.  · Your poop looks dark black and tarry.  · You cannot eat or drink without throwing up (vomiting).  · You have signs of dehydration, such as:  ? Dark pee, very little pee, or no pee.  ? Cracked lips.  ? Not making tears when you cry.  ? Dry mouth.  ? Sunken eyes.  ? Feeling sleepy.  ? Feeling weak.  ? Feeling  dizzy.  This information is not intended to replace advice given to you by your health care provider. Make sure you discuss any questions you have with your health care provider.  Document Released: 05/24/2009 Document Revised: 01/02/2016 Document Reviewed: 01/28/2015  Elsevier Interactive Patient Education © 2017 [employer] Inc.

## 2017-03-03 LAB — GLUCOSE, CAPILLARY
GLUCOSE-CAPILLARY: 107 mg/dL — AB (ref 65–99)
GLUCOSE-CAPILLARY: 137 mg/dL — AB (ref 65–99)
Glucose-Capillary: 155 mg/dL — ABNORMAL HIGH (ref 65–99)
Glucose-Capillary: 128 mg/dL — ABNORMAL HIGH (ref 65–99)
Glucose-Capillary: 121 mg/dL — ABNORMAL HIGH (ref 65–99)

## 2017-03-03 MED ORDER — METHYLPREDNISOLONE SODIUM SUCC 40 MG IJ SOLR
40.0000 mg | Freq: Every day | INTRAMUSCULAR | Status: DC
  Administered 2017-03-04: 40 mg via INTRAVENOUS
  Filled 2017-03-03: qty 1

## 2017-03-03 NOTE — Clinical Social Work Note (Signed)
CSW still awaiting [name] number, CSW to continue to follow patient's progress throughout discharge planning.  Jones Broom. [place], MSW, [place]  03/03/2017 4:53 PM

## 2017-03-03 NOTE — Progress Notes (Signed)
[facility] [place] NAME: Meghan [name]    MR#:  [id number]  DATE OF BIRTH:  March 01, 1946  SUBJECTIVE:  Agreeable to go to rehab, waiting for passar REVIEW OF SYSTEMS:    Review of Systems  Constitutional: Negative for chills and fever.  HENT: Negative for congestion and tinnitus.   Eyes: Negative for blurred vision and double vision.  Respiratory: Positive for shortness of breath. Negative for cough and wheezing.   Cardiovascular: Negative for chest pain, orthopnea and PND.  Gastrointestinal: Negative for abdominal pain, diarrhea, nausea and vomiting.  Genitourinary: Negative for dysuria and hematuria.  Neurological: Positive for weakness (Generalized). Negative for dizziness, sensory change and focal weakness.  All other systems reviewed and are negative.   Nutrition: Heart healthy Tolerating Diet: Yes Tolerating PT: Eval noted.   DRUG ALLERGIES:   Allergies  Allergen Reactions  . Gabapentin Other (See Comments)    Pt states that it causes her BP to drop.   . Naproxen Hives  . Nsaids Other (See Comments)    Reaction:  Unknown   . Tramadol Itching    VITALS:  Blood pressure 119/75, pulse 95, temperature 97.6 F (36.4 C), temperature source Oral, resp. rate 16, height 4\' 8"  (1.422 m), weight 65.4 kg (144 lb 2.3 oz), SpO2 100 %.  PHYSICAL EXAMINATION:   Physical Exam  GENERAL:  71 y.o.-year-old obese patient lying in bed Tearful at times but in NAD.  EYES: Pupils equal, round, reactive to light and accommodation. No scleral icterus. Extraocular muscles intact.  HEENT: Head atraumatic, normocephalic. Oropharynx and nasopharynx clear.  NECK:  Supple, no jugular venous distention. No thyroid enlargement, no tenderness.  LUNGS: Prolonged Insp. & exp.phase, minimal wheezing b/l, No rales, rhonchi. No use of accessory muscles of respiration.  CARDIOVASCULAR: S1, S2 normal. No murmurs, rubs, or gallops.  ABDOMEN: Soft, nontender,  nondistended. Bowel sounds present. No organomegaly or mass.  EXTREMITIES: No cyanosis, clubbing or edema b/l.    NEUROLOGIC: Cranial nerves II through XII are intact. No focal Motor or sensory deficits b/l.  Globally weak PSYCHIATRIC: The patient is alert and oriented x 3. Depressed? SKIN: No obvious rash, lesion, or ulcer.  LABORATORY PANEL:   CBC  Recent Labs Lab 03/02/17 0621  WBC 5.8  HGB 12.5  HCT 37.4  PLT 331   ------------------------------------------------------------------------------------------------------------------  Chemistries   Recent Labs Lab 02/26/17 2316  03/02/17 0621  NA 133*  < > 134*  K 4.1  < > 4.1  CL 103  < > 100*  CO2 21*  < > 23  GLUCOSE 154*  < > 143*  BUN 10  < > 24*  CREATININE 0.64  < > 0.71  CALCIUM 8.4*  < > 8.8*  AST 40  --   --   ALT 18  --   --   ALKPHOS 76  --   --   BILITOT 0.7  --   --   < > = values in this interval not displayed. ------------------------------------------------------------------------------------------------------------------  Cardiac Enzymes  Recent Labs Lab 02/27/17 1234  TROPONINI <0.03   ------------------------------------------------------------------------------------------------------------------  RADIOLOGY:  Ct Head Wo Contrast  Result Date: 03/02/2017 CLINICAL DATA:  Initial evaluation for acute mental status change. History of Celsius diff colitis, COPD. EXAM: CT HEAD WITHOUT CONTRAST TECHNIQUE: Contiguous axial images were obtained from the base of the skull through the vertex without intravenous contrast. COMPARISON:  Prior MRI from 11/01/2015. FINDINGS: Brain: Generalized cerebral atrophy with moderate chronic microvascular  ischemic disease. Postoperative changes related to previous right occipital craniectomy with underlying right cerebellar encephalomalacia, stable from previous. No acute intracranial hemorrhage. No evidence for acute large vessel territory infarct. No mass lesion,  midline shift or mass effect. No hydrocephalus. No extra-axial fluid collection. Vascular: No hyperdense vessel. Scattered vascular calcifications noted within the carotid siphons. Skull: Scalp soft tissues demonstrate no acute abnormality. Post craniectomy changes again noted at the right suboccipital calvarium. Calvarium otherwise intact. Sinuses/Orbits: Globes and orbital soft tissues within normal limits. Paranasal sinuses are clear. Right mastoid air cells clear. Left mastoid effusion with partial opacification left middle ear cavity. IMPRESSION: 1. No acute intracranial process identified. 2. Stable atrophy with chronic small vessel ischemic disease. 3. Postoperative changes from previous right suboccipital craniectomy with underlying right cerebellar encephalomalacia, stable. 4. Left mastoid effusion with partial opacification left middle ear cavity. Correlation with physical exam for possible otomastoiditis recommended. Electronically Signed   By: Jeannine Boga M.D.   On: 03/02/2017 16:10     ASSESSMENT AND PLAN:   71 year old female with past medical history of collagen vascular disease, COPD, diabetes, hypertension, hyperlipidemia, osteoporosis, PTSD recent admission for C. difficile colitis,, history of brain tumor who presents to the hospital shortness of breath, weakness.  1. Acute respiratory failure with hypoxia-secondary to bronchitis/pneumonitis -Patient had CT scan of the chest on previous hospitalization which was negative for pulmonary embolism.  - cont. IV steroids, scheduled DuoNeb nebs, Pulmicort nebs.  Slowly improving.   * Sinus Tach - continue metoprolol for rate control with some holding parameters. appreciate cardio input  2. Status post fall/generalized weakness-secondary to deconditioning and also underlying pulmonary illness. - Seen by PT and they recommend SNF/STR and social work aware.   3. C. difficile colitis-continue oral vancomycin, tolerating by mouth  well. Diarrhea has improved.  4. Essential hypertension-continue clonidine, Imdur - BP stable.   5. Diabetes type 2 without complication-continue sliding scale insulin, Tradjenta. Blood sugar stable.  6. DM Neuropathy - cont. Lyrica.   7. GERD - cont. Protonix.   8. Depression/anxiety-continue Xanax, Lexapro.  Family concerned about fall and some mental state change on admission but no head CT done so I've ordered and is neg.  Waiting for passar,    All the records are reviewed and case discussed with Care Management/Social Worker. Management plans discussed with the patient, nursing and they are in agreement.  CODE STATUS: Full code  DVT Prophylaxis: Lovenox  TOTAL TIME TAKING CARE OF THIS PATIENT: 20 minutes.   POSSIBLE D/C tomorrow, DEPENDING ON CLINICAL CONDITION.   Max Sane M.D on 03/03/2017 at 4:35 PM  Between 7am to 6pm - Pager - [phone]  After 6pm go to [email or web address] - password [email or web address] [place]  Office  [phone]  CC: Primary care physician; Lavera Guise, MD

## 2017-03-03 NOTE — Progress Notes (Signed)
Patient ID: Meghan [name], female   DOB: March 01, 1946, 71 y.o.   MRN: [id number]  [facility] PROGRESS NOTE  Meghan [name]:[id number] DOB: Aug 08, 1946 DOA: 02/26/2017 PCP: Lavera Guise, MD  HPI/Subjective: Patient was seen while eating lunch. She answered a few questions but is a person a few words. Had an episode of diarrhea with the nurse today.  Objective: Vitals:   03/03/17 0323 03/03/17 1735  BP: 119/75 107/71  Pulse: 95 88  Resp: 16   Temp: 97.6 F (36.4 C) 97.6 F (36.4 C)    Filed Weights   03/01/17 0500 03/02/17 0501 03/03/17 0323  Weight: 64.4 kg (142 lb) 66.5 kg (146 lb 8 oz) 65.4 kg (144 lb 2.3 oz)    ROS: Review of Systems  Unable to perform ROS: Acuity of condition  Respiratory: Negative for shortness of breath.   Cardiovascular: Negative for chest pain.  Gastrointestinal: Positive for diarrhea. Negative for abdominal pain.   Exam: Physical Exam  HENT:  Nose: No mucosal edema.  Mouth/Throat: No oropharyngeal exudate or posterior oropharyngeal edema.  Eyes: Pupils are equal, round, and reactive to light. Conjunctivae, EOM and lids are normal.  Neck: No JVD present. Carotid bruit is not present. No edema present. No thyroid mass and no thyromegaly present.  Cardiovascular: S1 normal and S2 normal.  Exam reveals no gallop.   Murmur heard.  Systolic murmur is present with a grade of 2/6  Pulses:      Dorsalis pedis pulses are 2+ on the right side, and 2+ on the left side.  Respiratory: No respiratory distress. She has decreased breath sounds in the right lower field and the left lower field. She has no wheezes. She has no rhonchi. She has no rales.  GI: Soft. Bowel sounds are normal. There is no tenderness.  Musculoskeletal:       Right ankle: She exhibits swelling.       Left ankle: She exhibits swelling.  Lymphadenopathy:    She has no cervical adenopathy.  Neurological: She is alert. No cranial nerve deficit.  Skin: Skin is warm. No rash noted. Nails  show no clubbing.  Psychiatric: Her affect is blunt.      Data Reviewed: Basic Metabolic Panel:  Recent Labs Lab 02/26/17 2316 02/27/17 0715 03/02/17 0621  NA 133* 136 134*  K 4.1 3.8 4.1  CL 103 105 100*  CO2 21* 24 23  GLUCOSE 154* 144* 143*  BUN 10 9 24*  CREATININE 0.64 0.58 0.71  CALCIUM 8.4* 8.3* 8.8*   Liver Function Tests:  Recent Labs Lab 02/26/17 2316  AST 40  ALT 18  ALKPHOS 76  BILITOT 0.7  PROT 5.2*  ALBUMIN 2.4*    Recent Labs Lab 02/26/17 2316  LIPASE 42   No results for input(s): AMMONIA in the last 168 hours. CBC:  Recent Labs Lab 02/26/17 2316 02/27/17 0715 02/28/17 0627 03/02/17 0621  WBC 6.2 5.1 4.6 5.8  HGB 11.7* 11.1* 11.4* 12.5  HCT 34.4* 32.5* 33.2* 37.4  MCV 89.2 89.5 89.6 89.3  PLT 247 235 274 331   Cardiac Enzymes:  Recent Labs Lab 02/26/17 2316 02/27/17 0715 02/27/17 1234  TROPONINI <0.03 <0.03 <0.03    CBG:  Recent Labs Lab 03/02/17 2050 03/03/17 0549 03/03/17 0752 03/03/17 1157 03/03/17 1655  GLUCAP 137* 155* 128* 107* 137*    Recent Results (from the past 240 hour(s))  Gastrointestinal Panel by PCR , Stool     Status: None   Collection Time:  02/22/17  1:13 PM  Result Value Ref Range Status   Campylobacter species NOT DETECTED NOT DETECTED Final   Plesimonas shigelloides NOT DETECTED NOT DETECTED Final   Salmonella species NOT DETECTED NOT DETECTED Final   Yersinia enterocolitica NOT DETECTED NOT DETECTED Final   Vibrio species NOT DETECTED NOT DETECTED Final   Vibrio cholerae NOT DETECTED NOT DETECTED Final   Enteroaggregative E coli (EAEC) NOT DETECTED NOT DETECTED Final   Enteropathogenic E coli (EPEC) NOT DETECTED NOT DETECTED Final   Enterotoxigenic E coli (ETEC) NOT DETECTED NOT DETECTED Final   Shiga like toxin producing E coli (STEC) NOT DETECTED NOT DETECTED Final   Shigella/Enteroinvasive E coli (EIEC) NOT DETECTED NOT DETECTED Final   Cryptosporidium NOT DETECTED NOT DETECTED Final    Cyclospora cayetanensis NOT DETECTED NOT DETECTED Final   Entamoeba histolytica NOT DETECTED NOT DETECTED Final   Giardia lamblia NOT DETECTED NOT DETECTED Final   Adenovirus F40/41 NOT DETECTED NOT DETECTED Final   Astrovirus NOT DETECTED NOT DETECTED Final   Norovirus GI/GII NOT DETECTED NOT DETECTED Final   Rotavirus A NOT DETECTED NOT DETECTED Final   Sapovirus (I, II, IV, and V) NOT DETECTED NOT DETECTED Final  Blood Culture (routine x 2)     Status: None   Collection Time: 02/22/17  1:13 PM  Result Value Ref Range Status   Specimen Description BLOOD LEFT ANTECUBITAL  Final   Special Requests   Final    BOTTLES DRAWN AEROBIC AND ANAEROBIC Blood Culture adequate volume   Culture NO GROWTH 5 DAYS  Final   Report Status 02/27/2017 FINAL  Final  C difficile quick scan w PCR reflex     Status: Abnormal   Collection Time: 02/22/17  1:13 PM  Result Value Ref Range Status   C Diff antigen POSITIVE (A) NEGATIVE Final   C Diff toxin POSITIVE (A) NEGATIVE Final   C Diff interpretation Toxin producing C. difficile detected.  Final    Comment: CRITICAL RESULT CALLED TO, READ BACK BY AND VERIFIED WITH: BRANDY FARRELL AT 1007 ON 02/22/2017 JJB   Blood Culture (routine x 2)     Status: None   Collection Time: 02/22/17  1:41 PM  Result Value Ref Range Status   Specimen Description BLOOD BLOOD RIGHT FOREARM  Final   Special Requests   Final    BOTTLES DRAWN AEROBIC AND ANAEROBIC Blood Culture adequate volume   Culture NO GROWTH 5 DAYS  Final   Report Status 02/27/2017 FINAL  Final     Studies: Ct Head Wo Contrast  Result Date: 03/02/2017 CLINICAL DATA:  Initial evaluation for acute mental status change. History of Celsius diff colitis, COPD. EXAM: CT HEAD WITHOUT CONTRAST TECHNIQUE: Contiguous axial images were obtained from the base of the skull through the vertex without intravenous contrast. COMPARISON:  Prior MRI from 11/01/2015. FINDINGS: Brain: Generalized cerebral atrophy with moderate  chronic microvascular ischemic disease. Postoperative changes related to previous right occipital craniectomy with underlying right cerebellar encephalomalacia, stable from previous. No acute intracranial hemorrhage. No evidence for acute large vessel territory infarct. No mass lesion, midline shift or mass effect. No hydrocephalus. No extra-axial fluid collection. Vascular: No hyperdense vessel. Scattered vascular calcifications noted within the carotid siphons. Skull: Scalp soft tissues demonstrate no acute abnormality. Post craniectomy changes again noted at the right suboccipital calvarium. Calvarium otherwise intact. Sinuses/Orbits: Globes and orbital soft tissues within normal limits. Paranasal sinuses are clear. Right mastoid air cells clear. Left mastoid effusion with partial opacification left middle  ear cavity. IMPRESSION: 1. No acute intracranial process identified. 2. Stable atrophy with chronic small vessel ischemic disease. 3. Postoperative changes from previous right suboccipital craniectomy with underlying right cerebellar encephalomalacia, stable. 4. Left mastoid effusion with partial opacification left middle ear cavity. Correlation with physical exam for possible otomastoiditis recommended. Electronically Signed   By: Jeannine Boga M.D.   On: 03/02/2017 16:10    Scheduled Meds: . budesonide (PULMICORT) nebulizer solution  0.5 mg Nebulization BID  . enoxaparin (LOVENOX) injection  40 mg Subcutaneous Q24H  . escitalopram  10 mg Oral q morning - 10a  . insulin aspart  0-5 Units Subcutaneous QHS  . insulin aspart  0-9 Units Subcutaneous TID WC  . ipratropium-albuterol  3 mL Nebulization BID  . lamoTRIgine  100 mg Oral Daily  . linagliptin  5 mg Oral Daily  . methylPREDNISolone (SOLU-MEDROL) injection  40 mg Intravenous Q12H  . metoprolol tartrate  25 mg Oral BID  . montelukast  10 mg Oral Daily  . nortriptyline  10 mg Oral QHS  . pantoprazole  40 mg Oral Daily  . pramipexole   0.25 mg Oral TID  . pregabalin  50 mg Oral BID  . sodium chloride flush  3 mL Intravenous Q12H  . topiramate  25 mg Oral BID  . vancomycin  125 mg Oral Q6H    Assessment/Plan:   1. Clostridium difficile colitis on oral vancomycin 2. Acute respiratory failure with hypoxia. Secondary to bronchitis pneumonitis. Switch steroids to daily dosing. Likely will not need any steroids upon getting out of the hospital. Tapered over to room air 3. Essential hypertension. Clonidine stopped and metoprolol started 4. Type 2 diabetes mellitus on sliding scale insulin here. 5. Depression and anxiety on Xanax and Lexapro 6. Diabetic neuropathy on Lyrica 7. GERD on Protonix  Code Status:     Code Status Orders        Start     Ordered   02/27/17 0540  Full code  Continuous     02/27/17 0539    Code Status History    Date Active Date Inactive Code Status Order ID Comments User Context   02/22/2017  3:47 PM 02/26/2017 11:05 PM Full Code [id number]  Vaughan Basta, MD Inpatient   03/01/2015  9:12 PM 03/10/2015  8:17 PM Full Code [id number]  Demetrios Loll, MD Inpatient   12/24/2014  5:27 AM 12/25/2014  6:32 PM DNR [id number]  Lance Coon, MD Inpatient    Advance Directive Documentation     Most Recent Value  Type of Advance Directive  Living will  Pre-existing out of facility DNR order (yellow form or pink MOST form)  -  "MOST" Form in Place?  -     Disposition Plan: Awaiting pass her to go to rehabilitation  Time spent: 31 minutes  Leslye Peer, [employer]

## 2017-03-03 NOTE — Progress Notes (Signed)
Patient ID: Meghan [name], female   DOB: Sep 10, 1945, 71 y.o.   MRN: [id number]  Waiting for passar to go to rehab Stable for discharge Edited discharge summer and reviewed medications  Dr Leslye Peer

## 2017-03-03 NOTE — Consult Note (Signed)
   [facility] CM Inpatient Consult   03/03/2017  Meghan [name] Nov 14, 1945 [id number]   Patient screened for potential [facility] Management services. Patient is on the [facility] registry as a benefit of their [employer] . Electronic medical record reveals patient's discharge plan is SNF. [facility] Care Management services not appropriate at this time. If patient's post hospital needs change please place a [facility] Care Management consult. For questions please contact:   Bishoy Cupp RN, [facility] Liaison  [phone]) Business Mobile [phone]) Toll free office

## 2017-03-04 DIAGNOSIS — Z6826 Body mass index (BMI) 26.0-26.9, adult: Secondary | ICD-10-CM | POA: Diagnosis not present

## 2017-03-04 DIAGNOSIS — N390 Urinary tract infection, site not specified: Secondary | ICD-10-CM | POA: Diagnosis not present

## 2017-03-04 DIAGNOSIS — R740 Nonspecific elevation of levels of transaminase and lactic acid dehydrogenase [LDH]: Secondary | ICD-10-CM | POA: Diagnosis not present

## 2017-03-04 DIAGNOSIS — J9601 Acute respiratory failure with hypoxia: Secondary | ICD-10-CM | POA: Diagnosis not present

## 2017-03-04 DIAGNOSIS — Z741 Need for assistance with personal care: Secondary | ICD-10-CM | POA: Diagnosis not present

## 2017-03-04 DIAGNOSIS — R7989 Other specified abnormal findings of blood chemistry: Secondary | ICD-10-CM | POA: Diagnosis not present

## 2017-03-04 DIAGNOSIS — M069 Rheumatoid arthritis, unspecified: Secondary | ICD-10-CM | POA: Diagnosis not present

## 2017-03-04 DIAGNOSIS — J90 Pleural effusion, not elsewhere classified: Secondary | ICD-10-CM | POA: Diagnosis not present

## 2017-03-04 DIAGNOSIS — G43719 Chronic migraine without aura, intractable, without status migrainosus: Secondary | ICD-10-CM | POA: Diagnosis not present

## 2017-03-04 DIAGNOSIS — I110 Hypertensive heart disease with heart failure: Secondary | ICD-10-CM | POA: Diagnosis not present

## 2017-03-04 DIAGNOSIS — I209 Angina pectoris, unspecified: Secondary | ICD-10-CM | POA: Diagnosis not present

## 2017-03-04 DIAGNOSIS — E119 Type 2 diabetes mellitus without complications: Secondary | ICD-10-CM | POA: Diagnosis not present

## 2017-03-04 DIAGNOSIS — J9621 Acute and chronic respiratory failure with hypoxia: Secondary | ICD-10-CM | POA: Diagnosis not present

## 2017-03-04 DIAGNOSIS — G4700 Insomnia, unspecified: Secondary | ICD-10-CM | POA: Diagnosis not present

## 2017-03-04 DIAGNOSIS — F419 Anxiety disorder, unspecified: Secondary | ICD-10-CM | POA: Diagnosis present

## 2017-03-04 DIAGNOSIS — Z01810 Encounter for preprocedural cardiovascular examination: Secondary | ICD-10-CM | POA: Diagnosis not present

## 2017-03-04 DIAGNOSIS — I5043 Acute on chronic combined systolic (congestive) and diastolic (congestive) heart failure: Secondary | ICD-10-CM | POA: Diagnosis not present

## 2017-03-04 DIAGNOSIS — F4310 Post-traumatic stress disorder, unspecified: Secondary | ICD-10-CM | POA: Diagnosis present

## 2017-03-04 DIAGNOSIS — J441 Chronic obstructive pulmonary disease with (acute) exacerbation: Secondary | ICD-10-CM | POA: Diagnosis not present

## 2017-03-04 DIAGNOSIS — A0472 Enterocolitis due to Clostridium difficile, not specified as recurrent: Secondary | ICD-10-CM | POA: Diagnosis not present

## 2017-03-04 DIAGNOSIS — Z4682 Encounter for fitting and adjustment of non-vascular catheter: Secondary | ICD-10-CM | POA: Diagnosis not present

## 2017-03-04 DIAGNOSIS — A419 Sepsis, unspecified organism: Secondary | ICD-10-CM | POA: Diagnosis not present

## 2017-03-04 DIAGNOSIS — R531 Weakness: Secondary | ICD-10-CM | POA: Diagnosis not present

## 2017-03-04 DIAGNOSIS — R6889 Other general symptoms and signs: Secondary | ICD-10-CM | POA: Diagnosis not present

## 2017-03-04 DIAGNOSIS — I959 Hypotension, unspecified: Secondary | ICD-10-CM | POA: Diagnosis not present

## 2017-03-04 DIAGNOSIS — J440 Chronic obstructive pulmonary disease with acute lower respiratory infection: Secondary | ICD-10-CM | POA: Diagnosis not present

## 2017-03-04 DIAGNOSIS — K219 Gastro-esophageal reflux disease without esophagitis: Secondary | ICD-10-CM | POA: Diagnosis not present

## 2017-03-04 DIAGNOSIS — R579 Shock, unspecified: Secondary | ICD-10-CM | POA: Diagnosis not present

## 2017-03-04 DIAGNOSIS — J449 Chronic obstructive pulmonary disease, unspecified: Secondary | ICD-10-CM | POA: Diagnosis not present

## 2017-03-04 DIAGNOSIS — R000 Tachycardia, unspecified: Secondary | ICD-10-CM | POA: Diagnosis not present

## 2017-03-04 DIAGNOSIS — F329 Major depressive disorder, single episode, unspecified: Secondary | ICD-10-CM | POA: Diagnosis present

## 2017-03-04 DIAGNOSIS — M810 Age-related osteoporosis without current pathological fracture: Secondary | ICD-10-CM | POA: Diagnosis not present

## 2017-03-04 DIAGNOSIS — G92 Toxic encephalopathy: Secondary | ICD-10-CM | POA: Diagnosis not present

## 2017-03-04 DIAGNOSIS — J810 Acute pulmonary edema: Secondary | ICD-10-CM | POA: Diagnosis not present

## 2017-03-04 DIAGNOSIS — T68XXXA Hypothermia, initial encounter: Secondary | ICD-10-CM | POA: Diagnosis not present

## 2017-03-04 DIAGNOSIS — I10 Essential (primary) hypertension: Secondary | ICD-10-CM | POA: Diagnosis not present

## 2017-03-04 DIAGNOSIS — Z9181 History of falling: Secondary | ICD-10-CM | POA: Diagnosis not present

## 2017-03-04 DIAGNOSIS — J45909 Unspecified asthma, uncomplicated: Secondary | ICD-10-CM | POA: Diagnosis not present

## 2017-03-04 DIAGNOSIS — I2510 Atherosclerotic heart disease of native coronary artery without angina pectoris: Secondary | ICD-10-CM | POA: Diagnosis not present

## 2017-03-04 DIAGNOSIS — J189 Pneumonia, unspecified organism: Secondary | ICD-10-CM | POA: Diagnosis not present

## 2017-03-04 DIAGNOSIS — R011 Cardiac murmur, unspecified: Secondary | ICD-10-CM | POA: Diagnosis not present

## 2017-03-04 DIAGNOSIS — I5023 Acute on chronic systolic (congestive) heart failure: Secondary | ICD-10-CM | POA: Diagnosis not present

## 2017-03-04 DIAGNOSIS — G43909 Migraine, unspecified, not intractable, without status migrainosus: Secondary | ICD-10-CM | POA: Diagnosis not present

## 2017-03-04 DIAGNOSIS — M359 Systemic involvement of connective tissue, unspecified: Secondary | ICD-10-CM | POA: Diagnosis not present

## 2017-03-04 DIAGNOSIS — I361 Nonrheumatic tricuspid (valve) insufficiency: Secondary | ICD-10-CM | POA: Diagnosis not present

## 2017-03-04 DIAGNOSIS — E860 Dehydration: Secondary | ICD-10-CM | POA: Diagnosis not present

## 2017-03-04 DIAGNOSIS — R0602 Shortness of breath: Secondary | ICD-10-CM | POA: Diagnosis not present

## 2017-03-04 DIAGNOSIS — B379 Candidiasis, unspecified: Secondary | ICD-10-CM | POA: Diagnosis not present

## 2017-03-04 DIAGNOSIS — R6521 Severe sepsis with septic shock: Secondary | ICD-10-CM | POA: Diagnosis not present

## 2017-03-04 DIAGNOSIS — R680 Hypothermia, not associated with low environmental temperature: Secondary | ICD-10-CM | POA: Diagnosis not present

## 2017-03-04 DIAGNOSIS — R55 Syncope and collapse: Secondary | ICD-10-CM | POA: Diagnosis not present

## 2017-03-04 DIAGNOSIS — R4020 Unspecified coma: Secondary | ICD-10-CM | POA: Diagnosis not present

## 2017-03-04 DIAGNOSIS — M059 Rheumatoid arthritis with rheumatoid factor, unspecified: Secondary | ICD-10-CM | POA: Diagnosis not present

## 2017-03-04 DIAGNOSIS — Z743 Need for continuous supervision: Secondary | ICD-10-CM | POA: Diagnosis not present

## 2017-03-04 DIAGNOSIS — R0600 Dyspnea, unspecified: Secondary | ICD-10-CM | POA: Diagnosis not present

## 2017-03-04 DIAGNOSIS — R269 Unspecified abnormalities of gait and mobility: Secondary | ICD-10-CM | POA: Diagnosis not present

## 2017-03-04 DIAGNOSIS — Z452 Encounter for adjustment and management of vascular access device: Secondary | ICD-10-CM | POA: Diagnosis not present

## 2017-03-04 DIAGNOSIS — M5030 Other cervical disc degeneration, unspecified cervical region: Secondary | ICD-10-CM | POA: Diagnosis not present

## 2017-03-04 DIAGNOSIS — Y95 Nosocomial condition: Secondary | ICD-10-CM | POA: Diagnosis present

## 2017-03-04 DIAGNOSIS — J9622 Acute and chronic respiratory failure with hypercapnia: Secondary | ICD-10-CM | POA: Diagnosis not present

## 2017-03-04 DIAGNOSIS — E7800 Pure hypercholesterolemia, unspecified: Secondary | ICD-10-CM | POA: Diagnosis not present

## 2017-03-04 DIAGNOSIS — E669 Obesity, unspecified: Secondary | ICD-10-CM | POA: Diagnosis present

## 2017-03-04 LAB — GLUCOSE, CAPILLARY
GLUCOSE-CAPILLARY: 99 mg/dL (ref 65–99)
Glucose-Capillary: 103 mg/dL — ABNORMAL HIGH (ref 65–99)

## 2017-03-04 MED ORDER — NYSTATIN 100000 UNIT/ML MT SUSP
5.0000 mL | Freq: Four times a day (QID) | OROMUCOSAL | Status: DC
  Administered 2017-03-04: 500000 [IU] via ORAL
  Filled 2017-03-04: qty 5

## 2017-03-04 MED ORDER — NYSTATIN 100000 UNIT/ML MT SUSP: 5.0000 mL | Freq: Four times a day (QID) | OROMUCOSAL | 0 refills | Status: DC

## 2017-03-04 MED ORDER — IPRATROPIUM-ALBUTEROL 0.5-2.5 (3) MG/3ML IN SOLN: 3.0000 mL | Freq: Two times a day (BID) | RESPIRATORY_TRACT | 0 refills | Status: DC

## 2017-03-04 NOTE — Progress Notes (Signed)
Patient ID: Meghan [name], female   DOB: 04/14/1946, 71 y.o.   MRN: [id number]  [facility] PROGRESS NOTE  Meghan [name] YQI:[id number] DOB: 02-Jan-1946 DOA: 02/26/2017 PCP: Lavera Guise, MD  HPI/Subjective: Patient is a person a few words. I had to get her aroused enough to talk with me. She answered no to every question I asked her.  Objective: Vitals:   03/04/17 0608 03/04/17 1409  BP: (!) 103/55 119/90  Pulse: 80   Resp: 16   Temp: 97.8 F (36.6 C) 97.6 F (36.4 C)    Filed Weights   03/02/17 0501 03/03/17 0323 03/04/17 3875  Weight: 66.5 kg (146 lb 8 oz) 65.4 kg (144 lb 2.3 oz) 65.3 kg (143 lb 14.4 oz)    ROS: Review of Systems  Unable to perform ROS: Mental acuity  Respiratory: Negative for shortness of breath.   Cardiovascular: Negative for chest pain.  Gastrointestinal: Negative for abdominal pain, constipation, diarrhea and nausea.  Musculoskeletal: Negative for joint pain.   Exam: Physical Exam  HENT:  Nose: No mucosal edema.  Mouth/Throat: No oropharyngeal exudate or posterior oropharyngeal edema.  Thrush in mouth  Eyes: Pupils are equal, round, and reactive to light. Conjunctivae, EOM and lids are normal.  Neck: No JVD present. Carotid bruit is not present. No edema present. No thyroid mass and no thyromegaly present.  Cardiovascular: S1 normal and S2 normal.  Exam reveals no gallop.   No murmur heard. Pulses:      Dorsalis pedis pulses are 2+ on the right side, and 2+ on the left side.  Respiratory: No respiratory distress. She has no wheezes. She has no rhonchi. She has no rales.  GI: Soft. Bowel sounds are normal. There is no tenderness.  Musculoskeletal:       Right ankle: She exhibits no swelling.       Left ankle: She exhibits no swelling.  Lymphadenopathy:    She has no cervical adenopathy.  Neurological: She is alert. No cranial nerve deficit.  Skin: No rash noted. Nails show no clubbing.  Feet cold to touch  Psychiatric: Her affect is blunt.       Data Reviewed: Basic Metabolic Panel:  Recent Labs Lab 02/26/17 2316 02/27/17 0715 03/02/17 0621  NA 133* 136 134*  K 4.1 3.8 4.1  CL 103 105 100*  CO2 21* 24 23  GLUCOSE 154* 144* 143*  BUN 10 9 24*  CREATININE 0.64 0.58 0.71  CALCIUM 8.4* 8.3* 8.8*   Liver Function Tests:  Recent Labs Lab 02/26/17 2316  AST 40  ALT 18  ALKPHOS 76  BILITOT 0.7  PROT 5.2*  ALBUMIN 2.4*    Recent Labs Lab 02/26/17 2316  LIPASE 42   CBC:  Recent Labs Lab 02/26/17 2316 02/27/17 0715 02/28/17 0627 03/02/17 0621  WBC 6.2 5.1 4.6 5.8  HGB 11.7* 11.1* 11.4* 12.5  HCT 34.4* 32.5* 33.2* 37.4  MCV 89.2 89.5 89.6 89.3  PLT 247 235 274 331   Cardiac Enzymes:  Recent Labs Lab 02/26/17 2316 02/27/17 0715 02/27/17 1234  TROPONINI <0.03 <0.03 <0.03    CBG:  Recent Labs Lab 03/03/17 1157 03/03/17 1655 03/03/17 2051 03/04/17 0736 03/04/17 1135  GLUCAP 107* 137* 121* 99 103*      Studies: Ct Head Wo Contrast  Result Date: 03/02/2017 CLINICAL DATA:  Initial evaluation for acute mental status change. History of Celsius diff colitis, COPD. EXAM: CT HEAD WITHOUT CONTRAST TECHNIQUE: Contiguous axial images were obtained from the base of  the skull through the vertex without intravenous contrast. COMPARISON:  Prior MRI from 11/01/2015. FINDINGS: Brain: Generalized cerebral atrophy with moderate chronic microvascular ischemic disease. Postoperative changes related to previous right occipital craniectomy with underlying right cerebellar encephalomalacia, stable from previous. No acute intracranial hemorrhage. No evidence for acute large vessel territory infarct. No mass lesion, midline shift or mass effect. No hydrocephalus. No extra-axial fluid collection. Vascular: No hyperdense vessel. Scattered vascular calcifications noted within the carotid siphons. Skull: Scalp soft tissues demonstrate no acute abnormality. Post craniectomy changes again noted at the right suboccipital  calvarium. Calvarium otherwise intact. Sinuses/Orbits: Globes and orbital soft tissues within normal limits. Paranasal sinuses are clear. Right mastoid air cells clear. Left mastoid effusion with partial opacification left middle ear cavity. IMPRESSION: 1. No acute intracranial process identified. 2. Stable atrophy with chronic small vessel ischemic disease. 3. Postoperative changes from previous right suboccipital craniectomy with underlying right cerebellar encephalomalacia, stable. 4. Left mastoid effusion with partial opacification left middle ear cavity. Correlation with physical exam for possible otomastoiditis recommended. Electronically Signed   By: Jeannine Boga M.D.   On: 03/02/2017 16:10    Scheduled Meds: . budesonide (PULMICORT) nebulizer solution  0.5 mg Nebulization BID  . enoxaparin (LOVENOX) injection  40 mg Subcutaneous Q24H  . escitalopram  10 mg Oral q morning - 10a  . insulin aspart  0-5 Units Subcutaneous QHS  . insulin aspart  0-9 Units Subcutaneous TID WC  . ipratropium-albuterol  3 mL Nebulization BID  . lamoTRIgine  100 mg Oral Daily  . linagliptin  5 mg Oral Daily  . metoprolol tartrate  25 mg Oral BID  . montelukast  10 mg Oral Daily  . nortriptyline  10 mg Oral QHS  . nystatin  5 mL Oral QID  . pantoprazole  40 mg Oral Daily  . pramipexole  0.25 mg Oral TID  . pregabalin  50 mg Oral BID  . sodium chloride flush  3 mL Intravenous Q12H  . topiramate  25 mg Oral BID  . vancomycin  125 mg Oral Q6H    Assessment/Plan:   1. Clostridium difficile colitis on oral vancomycin 2. Acute respiratory failure with hypoxia. Secondary to bronchitis and pneumonitis. Discontinue steroids. Continue  Nebulizer treatments. Patient breathing comfortably on room air at this point. 3. Essential hypertension.  Clonidine stopped the metoprolol started for tachycardia 4. Diabetes type 2 on Linagliptin 5. Depression and anxiety on Xanax and Lexapro 6. Diabetic neuropathy on  Lyrica 7. GERD on Protonix 8. Thrush. Nystatin swish and swallow started  Code Status:     Code Status Orders        Start     Ordered   02/27/17 0540  Full code  Continuous     02/27/17 0539    Code Status History    Date Active Date Inactive Code Status Order ID Comments User Context   02/22/2017  3:47 PM 02/26/2017 11:05 PM Full Code [id number]  Vaughan Basta, MD Inpatient   03/01/2015  9:12 PM 03/10/2015  8:17 PM Full Code [id number]  Demetrios Loll, MD Inpatient   12/24/2014  5:27 AM 12/25/2014  6:32 PM DNR [id number]  Lance Coon, MD Inpatient    Advance Directive Documentation     Most Recent Value  Type of Advance Directive  Living will  Pre-existing out of facility DNR order (yellow form or pink MOST form)  -  "MOST" Form in Place?  -     Family Communication: spoke with granddaughter on phone  Disposition Plan: Rehabilitation today  Antibiotics:  Oral vancomycin  Time spent: 32 minutes in coordination of care, speaking with family and social work team and editing discharge summary  Leslye Peer, Delfino Lovett  [employer]

## 2017-03-04 NOTE — Progress Notes (Signed)
Patient alert and oriented, vss, no complaints of pain.  Patient planning to discharge to [facility] via EMS.  D/C telemetry and PIV.  Patient agreeable to discharge.

## 2017-03-04 NOTE — Clinical Social Work Note (Addendum)
CSW was informed that patient [name] screen is going to [facility] for additional screening.  Patient has a bed at [facility] once [name] number has been received.  CSW continuing to follow patient's progress throughout discharge planning.  12:15pm  CSW received [name] number [id number], CSW updated patient's grandaughter Meghan [name], and [place] health care. Patient to be d/c'ed today to [facility].  Patient and family agreeable to plans will transport via ems RN to call report [phone].  Meghan [name]. [place], MSW, [place]  03/04/2017 10:15 AM

## 2017-03-04 NOTE — Clinical Social Work Placement (Signed)
   CLINICAL SOCIAL WORK PLACEMENT  NOTE  Date:  03/04/2017  Patient Details  Name: [name] MRN: [id number] Date of Birth: 20-Feb-1946  Clinical Social Work is seeking post-discharge placement for this patient at the [place] level of care (*CSW will initial, date and re-position this form in  chart as items are completed):  Yes   Patient/family provided with [place] Work Department's list of facilities offering this level of care within the geographic area requested by the patient (or if unable, by the patient's family).  Yes   Patient/family informed of their freedom to choose among providers that offer the needed level of care, that participate in Medicare, Medicaid or managed care program needed by the patient, have an available bed and are willing to accept the patient.  Yes   Patient/family informed of [place]'s ownership interest in [facility] and [facility], as well as of the fact that they are under no obligation to receive care at these facilities.  PASRR submitted to EDS on 03/01/17     PASRR number received on 03/04/17     Existing PASRR number confirmed on       FL2 transmitted to all facilities in geographic area requested by pt/family on 03/01/17     FL2 transmitted to all facilities within larger geographic area on       Patient informed that his/her managed care company has contracts with or will negotiate with certain facilities, including the following:        Yes   Patient/family informed of bed offers received.  Patient chooses bed at [facility]     Physician recommends and patient chooses bed at      Patient to be transferred to [facility] on 03/04/17.  Patient to be transferred to facility by [facility]     Patient family notified on 03/04/17 of transfer.  Name of family member notified:  Meghan [name] patient's grandaughter [phone]     PHYSICIAN Please sign FL2      Additional Comment:    _______________________________________________ Ross Ludwig, LCSWA 03/04/2017, 12:37 PM

## 2017-03-04 NOTE — Clinical Social Work Note (Signed)
Clinical Social Work Assessment  Patient Details  Name: Meghan [name] MRN: [id number] Date of Birth: [birth year]  Date of referral:  02/28/17               Reason for consult:  Facility Placement                Permission sought to share information with:  Facility Sport and exercise psychologist, Family Supports Permission granted to share information::  Yes, Verbal Permission Granted  Name::     Meghan [name] [phone] or Meghan [name] [phone]  Agency::  SNF admissions  Relationship::     Contact Information:     Housing/Transportation Living arrangements for the past 2 months:  [place] of Information:  Other (Comment Required) (Patient's grandaughters) Patient Interpreter Needed:  None Criminal Activity/Legal Involvement Pertinent to Current Situation/Hospitalization:  No - Comment as needed Significant Relationships:  Other Family Members Lives with:  Relatives Do you feel safe going back to the place where you live?  No Need for family participation in patient care:  Yes (Comment)  Care giving concerns: Patient's family feel she needs to go to SNF for short term rehab before she returns back home.   Social Worker assessment / plan:  Patient is a 71  Year old female who is alert and oriented x2.  Patient has some confusion assessment completed by speaking with patient's grandaughter [name] via phone.  Patient lives with grandaughters, she has not been to rehab in the past.  CSW explained to patient's grandaughter what to expect at SNF and what the process is for finding placement for patient at SNF.  CSW explained to patient's grandaughter how insurance will pay for stay and what to expect after patient is ready for discharge.  Patient's grandaughter expressed understanding and gave CSW permission to begin bed search in [facility].  Patient's grandaughter did not have any other questions or concerns.   Employment status:   Retired Nurse, adult PT Recommendations:  [place] / Referral to community resources:  [place]  Patient/Family's Response to care:  Patient's family is in agreement of going to SNF for short term rehab.  Patient/Family's Understanding of and Emotional Response to Diagnosis, Current Treatment, and Prognosis:  Patient's family were hopeful that patient will not have to be at [facility] for very long.  Emotional Assessment Appearance:  Appears stated age Attitude/Demeanor/Rapport:    Affect (typically observed):  Appropriate, Calm Orientation:  Oriented to Self, Oriented to Situation, Oriented to Place, Oriented to  Time Alcohol / Substance use:  Not Applicable Psych involvement (Current and /or in the community):  No (Comment)  Discharge Needs  Concerns to be addressed:  Lack of Support Readmission within the last 30 days:  No Current discharge risk:  Lack of support system Barriers to Discharge:  Awaiting State Approval ([place])   Ross Ludwig, LCSWA 02/28/17 2:15pm

## 2017-03-06 DIAGNOSIS — I10 Essential (primary) hypertension: Secondary | ICD-10-CM | POA: Diagnosis not present

## 2017-03-07 DIAGNOSIS — J449 Chronic obstructive pulmonary disease, unspecified: Secondary | ICD-10-CM | POA: Diagnosis not present

## 2017-03-07 DIAGNOSIS — E860 Dehydration: Secondary | ICD-10-CM | POA: Diagnosis not present

## 2017-03-07 DIAGNOSIS — E119 Type 2 diabetes mellitus without complications: Secondary | ICD-10-CM | POA: Diagnosis not present

## 2017-03-07 DIAGNOSIS — M069 Rheumatoid arthritis, unspecified: Secondary | ICD-10-CM | POA: Diagnosis not present

## 2017-03-07 DIAGNOSIS — K219 Gastro-esophageal reflux disease without esophagitis: Secondary | ICD-10-CM | POA: Diagnosis not present

## 2017-03-08 DIAGNOSIS — J441 Chronic obstructive pulmonary disease with (acute) exacerbation: Secondary | ICD-10-CM | POA: Diagnosis not present

## 2017-03-08 DIAGNOSIS — M359 Systemic involvement of connective tissue, unspecified: Secondary | ICD-10-CM | POA: Diagnosis not present

## 2017-03-08 DIAGNOSIS — I10 Essential (primary) hypertension: Secondary | ICD-10-CM | POA: Diagnosis not present

## 2017-03-08 DIAGNOSIS — A0472 Enterocolitis due to Clostridium difficile, not specified as recurrent: Secondary | ICD-10-CM | POA: Diagnosis not present

## 2017-03-20 ENCOUNTER — Emergency Department: Payer: Medicare Other

## 2017-03-20 ENCOUNTER — Inpatient Hospital Stay: Payer: Medicare Other

## 2017-03-20 ENCOUNTER — Inpatient Hospital Stay
Admission: EM | Admit: 2017-03-20 | Discharge: 2017-03-24 | DRG: 871 | Disposition: A | Discharge status: Skilled Nursing Facility | Payer: Medicare Other | Attending: Internal Medicine | Admitting: Internal Medicine

## 2017-03-20 DIAGNOSIS — K219 Gastro-esophageal reflux disease without esophagitis: Secondary | ICD-10-CM | POA: Diagnosis present

## 2017-03-20 DIAGNOSIS — E669 Obesity, unspecified: Secondary | ICD-10-CM | POA: Diagnosis present

## 2017-03-20 DIAGNOSIS — M5030 Other cervical disc degeneration, unspecified cervical region: Secondary | ICD-10-CM | POA: Diagnosis not present

## 2017-03-20 DIAGNOSIS — F329 Major depressive disorder, single episode, unspecified: Secondary | ICD-10-CM | POA: Diagnosis present

## 2017-03-20 DIAGNOSIS — N390 Urinary tract infection, site not specified: Secondary | ICD-10-CM | POA: Diagnosis not present

## 2017-03-20 DIAGNOSIS — J9621 Acute and chronic respiratory failure with hypoxia: Secondary | ICD-10-CM

## 2017-03-20 DIAGNOSIS — E876 Hypokalemia: Secondary | ICD-10-CM | POA: Diagnosis not present

## 2017-03-20 DIAGNOSIS — J45909 Unspecified asthma, uncomplicated: Secondary | ICD-10-CM | POA: Diagnosis not present

## 2017-03-20 DIAGNOSIS — I5043 Acute on chronic combined systolic (congestive) and diastolic (congestive) heart failure: Secondary | ICD-10-CM | POA: Diagnosis present

## 2017-03-20 DIAGNOSIS — J810 Acute pulmonary edema: Secondary | ICD-10-CM

## 2017-03-20 DIAGNOSIS — M810 Age-related osteoporosis without current pathological fracture: Secondary | ICD-10-CM | POA: Diagnosis present

## 2017-03-20 DIAGNOSIS — R918 Other nonspecific abnormal finding of lung field: Secondary | ICD-10-CM | POA: Diagnosis not present

## 2017-03-20 DIAGNOSIS — G43909 Migraine, unspecified, not intractable, without status migrainosus: Secondary | ICD-10-CM | POA: Diagnosis present

## 2017-03-20 DIAGNOSIS — L8990 Pressure ulcer of unspecified site, unspecified stage: Secondary | ICD-10-CM | POA: Insufficient documentation

## 2017-03-20 DIAGNOSIS — I959 Hypotension, unspecified: Secondary | ICD-10-CM | POA: Diagnosis not present

## 2017-03-20 DIAGNOSIS — I2510 Atherosclerotic heart disease of native coronary artery without angina pectoris: Secondary | ICD-10-CM | POA: Diagnosis present

## 2017-03-20 DIAGNOSIS — Z6826 Body mass index (BMI) 26.0-26.9, adult: Secondary | ICD-10-CM

## 2017-03-20 DIAGNOSIS — I5020 Unspecified systolic (congestive) heart failure: Secondary | ICD-10-CM | POA: Diagnosis not present

## 2017-03-20 DIAGNOSIS — T68XXXA Hypothermia, initial encounter: Secondary | ICD-10-CM

## 2017-03-20 DIAGNOSIS — J9600 Acute respiratory failure, unspecified whether with hypoxia or hypercapnia: Secondary | ICD-10-CM

## 2017-03-20 DIAGNOSIS — A419 Sepsis, unspecified organism: Principal | ICD-10-CM

## 2017-03-20 DIAGNOSIS — I361 Nonrheumatic tricuspid (valve) insufficiency: Secondary | ICD-10-CM | POA: Diagnosis not present

## 2017-03-20 DIAGNOSIS — J90 Pleural effusion, not elsewhere classified: Secondary | ICD-10-CM | POA: Diagnosis not present

## 2017-03-20 DIAGNOSIS — Z741 Need for assistance with personal care: Secondary | ICD-10-CM | POA: Diagnosis not present

## 2017-03-20 DIAGNOSIS — J9622 Acute and chronic respiratory failure with hypercapnia: Secondary | ICD-10-CM | POA: Diagnosis present

## 2017-03-20 DIAGNOSIS — R011 Cardiac murmur, unspecified: Secondary | ICD-10-CM | POA: Diagnosis not present

## 2017-03-20 DIAGNOSIS — J189 Pneumonia, unspecified organism: Secondary | ICD-10-CM | POA: Diagnosis present

## 2017-03-20 DIAGNOSIS — I5023 Acute on chronic systolic (congestive) heart failure: Secondary | ICD-10-CM | POA: Diagnosis not present

## 2017-03-20 DIAGNOSIS — R579 Shock, unspecified: Secondary | ICD-10-CM | POA: Diagnosis not present

## 2017-03-20 DIAGNOSIS — Y95 Nosocomial condition: Secondary | ICD-10-CM | POA: Diagnosis present

## 2017-03-20 DIAGNOSIS — J9601 Acute respiratory failure with hypoxia: Secondary | ICD-10-CM | POA: Diagnosis not present

## 2017-03-20 DIAGNOSIS — G9389 Other specified disorders of brain: Secondary | ICD-10-CM | POA: Diagnosis present

## 2017-03-20 DIAGNOSIS — M6281 Muscle weakness (generalized): Secondary | ICD-10-CM | POA: Diagnosis not present

## 2017-03-20 DIAGNOSIS — R269 Unspecified abnormalities of gait and mobility: Secondary | ICD-10-CM | POA: Diagnosis not present

## 2017-03-20 DIAGNOSIS — E785 Hyperlipidemia, unspecified: Secondary | ICD-10-CM | POA: Diagnosis present

## 2017-03-20 DIAGNOSIS — M059 Rheumatoid arthritis with rheumatoid factor, unspecified: Secondary | ICD-10-CM | POA: Diagnosis not present

## 2017-03-20 DIAGNOSIS — Z79899 Other long term (current) drug therapy: Secondary | ICD-10-CM

## 2017-03-20 DIAGNOSIS — I209 Angina pectoris, unspecified: Secondary | ICD-10-CM | POA: Diagnosis not present

## 2017-03-20 DIAGNOSIS — R0602 Shortness of breath: Secondary | ICD-10-CM | POA: Diagnosis not present

## 2017-03-20 DIAGNOSIS — G92 Toxic encephalopathy: Secondary | ICD-10-CM | POA: Diagnosis present

## 2017-03-20 DIAGNOSIS — R680 Hypothermia, not associated with low environmental temperature: Secondary | ICD-10-CM | POA: Diagnosis present

## 2017-03-20 DIAGNOSIS — Z4682 Encounter for fitting and adjustment of non-vascular catheter: Secondary | ICD-10-CM | POA: Diagnosis not present

## 2017-03-20 DIAGNOSIS — J440 Chronic obstructive pulmonary disease with acute lower respiratory infection: Secondary | ICD-10-CM | POA: Diagnosis present

## 2017-03-20 DIAGNOSIS — J449 Chronic obstructive pulmonary disease, unspecified: Secondary | ICD-10-CM | POA: Diagnosis present

## 2017-03-20 DIAGNOSIS — E7800 Pure hypercholesterolemia, unspecified: Secondary | ICD-10-CM | POA: Diagnosis not present

## 2017-03-20 DIAGNOSIS — Z9181 History of falling: Secondary | ICD-10-CM | POA: Diagnosis not present

## 2017-03-20 DIAGNOSIS — I110 Hypertensive heart disease with heart failure: Secondary | ICD-10-CM | POA: Diagnosis present

## 2017-03-20 DIAGNOSIS — R6521 Severe sepsis with septic shock: Secondary | ICD-10-CM | POA: Diagnosis present

## 2017-03-20 DIAGNOSIS — R4020 Unspecified coma: Secondary | ICD-10-CM | POA: Diagnosis not present

## 2017-03-20 DIAGNOSIS — Z452 Encounter for adjustment and management of vascular access device: Secondary | ICD-10-CM

## 2017-03-20 DIAGNOSIS — A0472 Enterocolitis due to Clostridium difficile, not specified as recurrent: Secondary | ICD-10-CM | POA: Diagnosis not present

## 2017-03-20 DIAGNOSIS — Z7401 Bed confinement status: Secondary | ICD-10-CM | POA: Diagnosis not present

## 2017-03-20 DIAGNOSIS — F4310 Post-traumatic stress disorder, unspecified: Secondary | ICD-10-CM | POA: Diagnosis present

## 2017-03-20 DIAGNOSIS — Z4659 Encounter for fitting and adjustment of other gastrointestinal appliance and device: Secondary | ICD-10-CM

## 2017-03-20 DIAGNOSIS — R0600 Dyspnea, unspecified: Secondary | ICD-10-CM

## 2017-03-20 DIAGNOSIS — Z01810 Encounter for preprocedural cardiovascular examination: Secondary | ICD-10-CM | POA: Diagnosis not present

## 2017-03-20 DIAGNOSIS — Z794 Long term (current) use of insulin: Secondary | ICD-10-CM

## 2017-03-20 DIAGNOSIS — R7989 Other specified abnormal findings of blood chemistry: Secondary | ICD-10-CM | POA: Diagnosis not present

## 2017-03-20 DIAGNOSIS — R55 Syncope and collapse: Secondary | ICD-10-CM | POA: Diagnosis not present

## 2017-03-20 DIAGNOSIS — R740 Nonspecific elevation of levels of transaminase and lactic acid dehydrogenase [LDH]: Secondary | ICD-10-CM | POA: Diagnosis not present

## 2017-03-20 DIAGNOSIS — F419 Anxiety disorder, unspecified: Secondary | ICD-10-CM | POA: Diagnosis present

## 2017-03-20 DIAGNOSIS — G43719 Chronic migraine without aura, intractable, without status migrainosus: Secondary | ICD-10-CM | POA: Diagnosis not present

## 2017-03-20 DIAGNOSIS — G4700 Insomnia, unspecified: Secondary | ICD-10-CM | POA: Diagnosis not present

## 2017-03-20 DIAGNOSIS — E119 Type 2 diabetes mellitus without complications: Secondary | ICD-10-CM | POA: Diagnosis not present

## 2017-03-20 DIAGNOSIS — M069 Rheumatoid arthritis, unspecified: Secondary | ICD-10-CM | POA: Diagnosis not present

## 2017-03-20 DIAGNOSIS — E109 Type 1 diabetes mellitus without complications: Secondary | ICD-10-CM

## 2017-03-20 LAB — LACTIC ACID, PLASMA
LACTIC ACID, VENOUS: 1.6 mmol/L (ref 0.5–1.9)
LACTIC ACID, VENOUS: 1.7 mmol/L (ref 0.5–1.9)
LACTIC ACID, VENOUS: 2.4 mmol/L — AB (ref 0.5–1.9)

## 2017-03-20 LAB — TROPONIN I
Troponin I: <0.03 ng/mL (ref <0.03)
Troponin I: 0.05 ng/mL (ref <0.03)

## 2017-03-20 LAB — CBC WITH DIFFERENTIAL/PLATELET
Basophils Absolute: 0 10*3/uL (ref 0–0.1)
Basophils Relative: 1 %
EOS ABS: 0.1 10*3/uL (ref 0–0.7)
EOS PCT: 2 %
HCT: 40.3 % (ref 35.0–47.0)
Hemoglobin: 13.4 g/dL (ref 12.0–16.0)
LYMPHS ABS: 0.3 10*3/uL — AB (ref 1.0–3.6)
LYMPHS PCT: 6 %
MCH: 30.1 pg (ref 26.0–34.0)
MCHC: 33.2 g/dL (ref 32.0–36.0)
MCV: 90.5 fL (ref 80.0–100.0)
MONO ABS: 0.3 10*3/uL (ref 0.2–0.9)
MONOS PCT: 6 %
Neutro Abs: 4.4 10*3/uL (ref 1.4–6.5)
Neutrophils Relative %: 85 %
PLATELETS: 207 10*3/uL (ref 150–440)
RBC: 4.45 MIL/uL (ref 3.80–5.20)
RDW: 17.3 % — AB (ref 11.5–14.5)
WBC: 5.2 10*3/uL (ref 3.6–11.0)

## 2017-03-20 LAB — BLOOD GAS, ARTERIAL
ACID-BASE DEFICIT: 2.4 mmol/L — AB (ref 0.0–2.0)
Acid-base deficit: 0 mmol/L (ref 0.0–2.0)
BICARBONATE: 24.9 mmol/L (ref 20.0–28.0)
Bicarbonate: 27.6 mmol/L (ref 20.0–28.0)
FIO2: 0.4
FIO2: 0.5
MECHVT: 450 mL
Mechanical Rate: 15
O2 Saturation: 91.2 %
O2 Saturation: 89.9 %
PATIENT TEMPERATURE: 37
PCO2 ART: 56 mmHg — AB (ref 32.0–48.0)
PCO2 ART: 53 mmHg — AB (ref 32.0–48.0)
PEEP: 5 cmH2O
PH ART: 7.3 — AB (ref 7.350–7.450)
PH ART: 7.28 — AB (ref 7.350–7.450)
PO2 ART: 66 mmHg — AB (ref 83.0–108.0)
Patient temperature: 37
pO2, Arterial: 68 mmHg — ABNORMAL LOW (ref 83.0–108.0)

## 2017-03-20 LAB — URINALYSIS, ROUTINE W REFLEX MICROSCOPIC
BILIRUBIN URINE: NEGATIVE
Glucose, UA: NEGATIVE mg/dL
KETONES UR: NEGATIVE mg/dL
NITRITE: POSITIVE — AB
PH: 5 (ref 5.0–8.0)
Protein, ur: 100 mg/dL — AB
SPECIFIC GRAVITY, URINE: 1.017 (ref 1.005–1.030)

## 2017-03-20 LAB — COMPREHENSIVE METABOLIC PANEL
ALK PHOS: 119 U/L (ref 38–126)
ALT: 17 U/L (ref 14–54)
ANION GAP: 8 (ref 5–15)
AST: 33 U/L (ref 15–41)
Albumin: 2.7 g/dL — ABNORMAL LOW (ref 3.5–5.0)
BUN: 12 mg/dL (ref 6–20)
CHLORIDE: 110 mmol/L (ref 101–111)
CO2: 23 mmol/L (ref 22–32)
Calcium: 8.3 mg/dL — ABNORMAL LOW (ref 8.9–10.3)
Creatinine, Ser: 0.64 mg/dL (ref 0.44–1.00)
GLUCOSE: 218 mg/dL — AB (ref 65–99)
POTASSIUM: 3.1 mmol/L — AB (ref 3.5–5.1)
Sodium: 141 mmol/L (ref 135–145)
TOTAL PROTEIN: 5.8 g/dL — AB (ref 6.5–8.1)
Total Bilirubin: 1 mg/dL (ref 0.3–1.2)

## 2017-03-20 LAB — BASIC METABOLIC PANEL
Anion gap: 7 (ref 5–15)
BUN: 11 mg/dL (ref 6–20)
CO2: 24 mmol/L (ref 22–32)
CREATININE: 0.39 mg/dL — AB (ref 0.44–1.00)
Calcium: 7.7 mg/dL — ABNORMAL LOW (ref 8.9–10.3)
Chloride: 110 mmol/L (ref 101–111)
Glucose, Bld: 173 mg/dL — ABNORMAL HIGH (ref 65–99)
Potassium: 3.1 mmol/L — ABNORMAL LOW (ref 3.5–5.1)
SODIUM: 141 mmol/L (ref 135–145)

## 2017-03-20 LAB — GLUCOSE, CAPILLARY
GLUCOSE-CAPILLARY: 177 mg/dL — AB (ref 65–99)
GLUCOSE-CAPILLARY: 167 mg/dL — AB (ref 65–99)
GLUCOSE-CAPILLARY: 198 mg/dL — AB (ref 65–99)
Glucose-Capillary: 178 mg/dL — ABNORMAL HIGH (ref 65–99)
Glucose-Capillary: 158 mg/dL — ABNORMAL HIGH (ref 65–99)

## 2017-03-20 LAB — MRSA PCR SCREENING: MRSA BY PCR: POSITIVE — AB

## 2017-03-20 LAB — CBC
HCT: 39.1 % (ref 35.0–47.0)
Hemoglobin: 13.2 g/dL (ref 12.0–16.0)
MCH: 30.4 pg (ref 26.0–34.0)
MCHC: 33.8 g/dL (ref 32.0–36.0)
MCV: 89.9 fL (ref 80.0–100.0)
PLATELETS: 279 10*3/uL (ref 150–440)
RBC: 4.35 MIL/uL (ref 3.80–5.20)
RDW: 17.4 % — AB (ref 11.5–14.5)
WBC: 9.5 10*3/uL (ref 3.6–11.0)

## 2017-03-20 LAB — MAGNESIUM: Magnesium: 1.6 mg/dL — ABNORMAL LOW (ref 1.7–2.4)

## 2017-03-20 LAB — PHOSPHORUS: Phosphorus: 4.7 mg/dL — ABNORMAL HIGH (ref 2.5–4.6)

## 2017-03-20 LAB — PROCALCITONIN: PROCALCITONIN: 0.14 ng/mL

## 2017-03-20 LAB — BRAIN NATRIURETIC PEPTIDE: B Natriuretic Peptide: 1333 pg/mL — ABNORMAL HIGH (ref 0.0–100.0)

## 2017-03-20 MED ORDER — MIDAZOLAM HCL 2 MG/2ML IJ SOLN
1.0000 mg | INTRAMUSCULAR | Status: DC | PRN
  Administered 2017-03-21: 2 mg via INTRAVENOUS
  Filled 2017-03-20: qty 2

## 2017-03-20 MED ORDER — VANCOMYCIN HCL IN DEXTROSE 750-5 MG/150ML-% IV SOLN
750.0000 mg | INTRAVENOUS | Status: DC
  Administered 2017-03-20 – 2017-03-23 (×4): 750 mg via INTRAVENOUS
  Filled 2017-03-20 (×5): qty 150

## 2017-03-20 MED ORDER — VANCOMYCIN HCL IN DEXTROSE 1-5 GM/200ML-% IV SOLN
1000.0000 mg | Freq: Once | INTRAVENOUS | Status: AC
  Administered 2017-03-20: 1000 mg via INTRAVENOUS
  Filled 2017-03-20: qty 200

## 2017-03-20 MED ORDER — SENNOSIDES 8.8 MG/5ML PO SYRP
5.0000 mL | ORAL_SOLUTION | Freq: Two times a day (BID) | ORAL | Status: DC | PRN
  Filled 2017-03-20: qty 5

## 2017-03-20 MED ORDER — POTASSIUM CHLORIDE 20 MEQ PO PACK
40.0000 meq | PACK | Freq: Once | ORAL | Status: AC
  Administered 2017-03-20: 40 meq via NASOGASTRIC
  Filled 2017-03-20: qty 2

## 2017-03-20 MED ORDER — ORAL CARE MOUTH RINSE
15.0000 mL | Freq: Four times a day (QID) | OROMUCOSAL | Status: DC
  Administered 2017-03-21 – 2017-03-22 (×3): 15 mL via OROMUCOSAL

## 2017-03-20 MED ORDER — DOPAMINE-DEXTROSE 3.2-5 MG/ML-% IV SOLN
0.0000 ug/kg/min | INTRAVENOUS | Status: DC
  Administered 2017-03-20: 5 ug/kg/min via INTRAVENOUS

## 2017-03-20 MED ORDER — PANTOPRAZOLE SODIUM 40 MG IV SOLR
40.0000 mg | INTRAVENOUS | Status: DC
  Administered 2017-03-20 – 2017-03-22 (×3): 40 mg via INTRAVENOUS
  Filled 2017-03-20 (×4): qty 40

## 2017-03-20 MED ORDER — CHLORHEXIDINE GLUCONATE 0.12% ORAL RINSE (MEDLINE KIT)
15.0000 mL | Freq: Two times a day (BID) | OROMUCOSAL | Status: DC
  Administered 2017-03-21 (×2): 15 mL via OROMUCOSAL

## 2017-03-20 MED ORDER — LAMOTRIGINE 25 MG PO TABS: 100.0000 mg | ORAL_TABLET | Freq: Every day | ORAL | Status: DC

## 2017-03-20 MED ORDER — SODIUM CHLORIDE 0.9% FLUSH
10.0000 mL | Freq: Two times a day (BID) | INTRAVENOUS | Status: DC
  Administered 2017-03-20: 20 mL
  Administered 2017-03-20 – 2017-03-22 (×4): 10 mL

## 2017-03-20 MED ORDER — POTASSIUM CHLORIDE 10 MEQ/50ML IV SOLN
10.0000 meq | INTRAVENOUS | Status: AC
  Administered 2017-03-20 – 2017-03-21 (×4): 10 meq via INTRAVENOUS
  Filled 2017-03-20 (×4): qty 50

## 2017-03-20 MED ORDER — FENTANYL BOLUS VIA INFUSION
25.0000 ug | INTRAVENOUS | Status: DC | PRN
  Administered 2017-03-20 – 2017-03-21 (×3): 25 ug via INTRAVENOUS
  Filled 2017-03-20: qty 25

## 2017-03-20 MED ORDER — BUDESONIDE 0.5 MG/2ML IN SUSP
0.5000 mg | Freq: Two times a day (BID) | RESPIRATORY_TRACT | Status: DC
  Administered 2017-03-20 – 2017-03-24 (×12): 0.5 mg via RESPIRATORY_TRACT
  Filled 2017-03-20 (×10): qty 2

## 2017-03-20 MED ORDER — ACETAMINOPHEN 325 MG PO TABS
650.0000 mg | ORAL_TABLET | Freq: Four times a day (QID) | ORAL | Status: DC | PRN
  Administered 2017-03-22 – 2017-03-23 (×2): 650 mg via ORAL
  Filled 2017-03-20 (×2): qty 2

## 2017-03-20 MED ORDER — BUDESONIDE 0.5 MG/2ML IN SUSP: 0.5000 mg | Freq: Two times a day (BID) | RESPIRATORY_TRACT | Status: DC

## 2017-03-20 MED ORDER — FUROSEMIDE 10 MG/ML IJ SOLN
20.0000 mg | Freq: Three times a day (TID) | INTRAMUSCULAR | Status: AC
  Administered 2017-03-20 – 2017-03-21 (×3): 20 mg via INTRAVENOUS
  Filled 2017-03-20 (×3): qty 2

## 2017-03-20 MED ORDER — ONDANSETRON HCL 4 MG/2ML IJ SOLN
4.0000 mg | Freq: Four times a day (QID) | INTRAMUSCULAR | Status: DC | PRN
  Administered 2017-03-23: 4 mg via INTRAVENOUS
  Filled 2017-03-20: qty 2

## 2017-03-20 MED ORDER — SODIUM CHLORIDE 0.9 % IV BOLUS (SEPSIS)
1000.0000 mL | Freq: Once | INTRAVENOUS | Status: AC
  Administered 2017-03-20: 1000 mL via INTRAVENOUS

## 2017-03-20 MED ORDER — CEFEPIME HCL 2 G IJ SOLR
2.0000 g | Freq: Two times a day (BID) | INTRAMUSCULAR | Status: DC
  Administered 2017-03-20 – 2017-03-22 (×4): 2 g via INTRAVENOUS
  Filled 2017-03-20 (×6): qty 2

## 2017-03-20 MED ORDER — SODIUM CHLORIDE 0.9 % IV BOLUS (SEPSIS): 1000.0000 mL | Freq: Once | INTRAVENOUS | Status: DC

## 2017-03-20 MED ORDER — SODIUM CHLORIDE 0.9% FLUSH
10.0000 mL | INTRAVENOUS | Status: DC | PRN
  Administered 2017-03-21: 10 mL
  Filled 2017-03-20: qty 40

## 2017-03-20 MED ORDER — ROCURONIUM BROMIDE 50 MG/5ML IV SOLN
75.0000 mg | Freq: Once | INTRAVENOUS | Status: AC
  Administered 2017-03-20: 75 mg via INTRAVENOUS

## 2017-03-20 MED ORDER — HEPARIN SODIUM (PORCINE) 5000 UNIT/ML IJ SOLN
5000.0000 [IU] | Freq: Three times a day (TID) | INTRAMUSCULAR | Status: DC
  Administered 2017-03-20 – 2017-03-22 (×7): 5000 [IU] via SUBCUTANEOUS
  Filled 2017-03-20 (×7): qty 1

## 2017-03-20 MED ORDER — TOPIRAMATE 25 MG PO TABS
25.0000 mg | ORAL_TABLET | Freq: Two times a day (BID) | ORAL | Status: DC
  Administered 2017-03-20 – 2017-03-24 (×9): 25 mg
  Filled 2017-03-20 (×9): qty 1

## 2017-03-20 MED ORDER — NOREPINEPHRINE BITARTRATE 1 MG/ML IV SOLN
0.0000 ug/min | INTRAVENOUS | Status: DC
  Administered 2017-03-20: 5 ug/min via INTRAVENOUS
  Administered 2017-03-20: 7 ug/min via INTRAVENOUS
  Administered 2017-03-21: 3 ug/min via INTRAVENOUS
  Filled 2017-03-20 (×3): qty 16

## 2017-03-20 MED ORDER — FENTANYL 2500MCG IN NS 250ML (10MCG/ML) PREMIX INFUSION
25.0000 ug/h | INTRAVENOUS | Status: DC
  Administered 2017-03-20 (×2): 50 ug/h via INTRAVENOUS
  Filled 2017-03-20 (×2): qty 250

## 2017-03-20 MED ORDER — CHLORHEXIDINE GLUCONATE 0.12% ORAL RINSE (MEDLINE KIT)
15.0000 mL | Freq: Two times a day (BID) | OROMUCOSAL | Status: DC
  Administered 2017-03-20 (×2): 15 mL via OROMUCOSAL

## 2017-03-20 MED ORDER — IPRATROPIUM-ALBUTEROL 0.5-2.5 (3) MG/3ML IN SOLN
3.0000 mL | Freq: Four times a day (QID) | RESPIRATORY_TRACT | Status: DC
  Administered 2017-03-20 – 2017-03-24 (×19): 3 mL via RESPIRATORY_TRACT
  Filled 2017-03-20 (×18): qty 3

## 2017-03-20 MED ORDER — INSULIN ASPART 100 UNIT/ML ~~LOC~~ SOLN
0.0000 [IU] | SUBCUTANEOUS | Status: DC
  Administered 2017-03-20 (×5): 2 [IU] via SUBCUTANEOUS
  Filled 2017-03-20 (×5): qty 1

## 2017-03-20 MED ORDER — MIDAZOLAM HCL 2 MG/2ML IJ SOLN: 1.0000 mg | INTRAMUSCULAR | Status: DC | PRN

## 2017-03-20 MED ORDER — ONDANSETRON HCL 4 MG PO TABS: 4.0000 mg | ORAL_TABLET | Freq: Four times a day (QID) | ORAL | Status: DC | PRN

## 2017-03-20 MED ORDER — METHYLPREDNISOLONE SODIUM SUCC 125 MG IJ SOLR
125.0000 mg | Freq: Once | INTRAMUSCULAR | Status: AC
  Administered 2017-03-20: 125 mg via INTRAVENOUS
  Filled 2017-03-20: qty 2

## 2017-03-20 MED ORDER — LAMOTRIGINE 100 MG PO TABS
100.0000 mg | ORAL_TABLET | Freq: Every day | ORAL | Status: DC
  Administered 2017-03-20 – 2017-03-24 (×5): 100 mg
  Filled 2017-03-20: qty 4
  Filled 2017-03-20 (×2): qty 1
  Filled 2017-03-20 (×3): qty 4

## 2017-03-20 MED ORDER — FENTANYL CITRATE (PF) 100 MCG/2ML IJ SOLN
50.0000 ug | Freq: Once | INTRAMUSCULAR | Status: AC
  Administered 2017-03-20: 100 ug via INTRAVENOUS
  Filled 2017-03-20: qty 2

## 2017-03-20 MED ORDER — IPRATROPIUM-ALBUTEROL 0.5-2.5 (3) MG/3ML IN SOLN
RESPIRATORY_TRACT | Status: AC
  Administered 2017-03-20: 3 mL via RESPIRATORY_TRACT
  Filled 2017-03-20: qty 3

## 2017-03-20 MED ORDER — ESCITALOPRAM OXALATE 10 MG PO TABS
10.0000 mg | ORAL_TABLET | Freq: Every morning | ORAL | Status: DC
  Administered 2017-03-20 – 2017-03-24 (×5): 10 mg
  Filled 2017-03-20 (×4): qty 1

## 2017-03-20 MED ORDER — DEXTROSE 5 % IV SOLN
2.0000 g | Freq: Once | INTRAVENOUS | Status: AC
  Administered 2017-03-20: 2 g via INTRAVENOUS
  Filled 2017-03-20: qty 2

## 2017-03-20 MED ORDER — ETOMIDATE 2 MG/ML IV SOLN
10.0000 mg | Freq: Once | INTRAVENOUS | Status: AC
  Administered 2017-03-20: 10 mg via INTRAVENOUS

## 2017-03-20 MED ORDER — ORAL CARE MOUTH RINSE
15.0000 mL | OROMUCOSAL | Status: DC
  Administered 2017-03-20 – 2017-03-22 (×18): 15 mL via OROMUCOSAL

## 2017-03-20 MED ORDER — PROPOFOL 1000 MG/100ML IV EMUL
INTRAVENOUS | Status: AC
  Filled 2017-03-20: qty 100

## 2017-03-20 MED ORDER — NOREPINEPHRINE BITARTRATE 1 MG/ML IV SOLN
0.0000 ug/min | Freq: Once | INTRAVENOUS | Status: AC
  Administered 2017-03-20: 2 ug/min via INTRAVENOUS
  Filled 2017-03-20: qty 4

## 2017-03-20 MED ORDER — BISACODYL 10 MG RE SUPP: 10.0000 mg | Freq: Every day | RECTAL | Status: DC | PRN

## 2017-03-20 MED ORDER — TOPIRAMATE 25 MG PO TABS
25.0000 mg | ORAL_TABLET | Freq: Two times a day (BID) | ORAL | Status: DC
  Filled 2017-03-20: qty 1

## 2017-03-20 MED ORDER — SODIUM CHLORIDE 0.9 % IV BOLUS (SEPSIS)
1000.0000 mL | INTRAVENOUS | Status: DC
  Administered 2017-03-20: 1000 mL via INTRAVENOUS

## 2017-03-20 MED ORDER — ACETAMINOPHEN 650 MG RE SUPP: 650.0000 mg | Freq: Four times a day (QID) | RECTAL | Status: DC | PRN

## 2017-03-20 MED ORDER — SODIUM CHLORIDE 0.9 % IV BOLUS (SEPSIS)
250.0000 mL | Freq: Once | INTRAVENOUS | Status: AC
  Administered 2017-03-20: 250 mL via INTRAVENOUS

## 2017-03-20 MED ORDER — ESCITALOPRAM OXALATE 10 MG PO TABS
10.0000 mg | ORAL_TABLET | Freq: Every morning | ORAL | Status: DC
  Filled 2017-03-20: qty 1

## 2017-03-20 NOTE — ED Notes (Signed)
Respiratory at bedside.

## 2017-03-20 NOTE — ED Notes (Signed)
Patient paced on bipap

## 2017-03-20 NOTE — Progress Notes (Signed)
1252- Lab called with critical Troponin Lab Value of 0.05. 1307- MD paged- Dr. Ashby Dawes- spoke with Dr. Juanell Fairly and made aware of critical lab value

## 2017-03-20 NOTE — Progress Notes (Signed)
[place] Progress Note [name] DOB: 02/24/1946 MRN: [id number]   Date of Service  03/20/2017  HPI/Events of Note  New patient evaluation  eICU Interventions  Nothing further to add     Intervention Category Major Interventions: Other:  [name] 03/20/2017, 3:53 AM

## 2017-03-20 NOTE — ED Triage Notes (Signed)
Patient found unresponsive by staff at [facility]  Patient initially unresponsive to verbal and painful stimulation.  Pt now responsive to verbal stimulation, however has incomprehensible speech.  EMS unable to obtain oxygen saturation due to cold extremities. Patient had frothy sputum and rhonchi at EMS arrival

## 2017-03-20 NOTE — Progress Notes (Signed)
Pharmacy Antibiotic Note  Meghan [name] is a 71 y.o. female admitted on 03/20/2017 with pneumonia.  Pharmacy has been consulted for vancomycin and cefepime dosing.  Plan: Vancomycin 750 mg q 18 hours ordered with stacked dosing. Level before 5th dose. Goal trough 15-20.  Cefepime 2 grams q 12 hours ordered.  Height: 5\' 1"  (154.9 cm) Weight: 139 lb 15.9 oz (63.5 kg) IBW/kg (Calculated) : 47.8  Temp (24hrs), Avg:96.6 F (35.9 C), Min:93.5 F (34.2 C), Max:97.9 F (36.6 C)   Recent Labs Lab 03/20/17 0209  WBC 5.2  CREATININE 0.64  LATICACIDVEN 1.6    Estimated Creatinine Clearance: 55.1 mL/min (by C-G formula based on SCr of 0.64 mg/dL).    Allergies  Allergen Reactions  . Gabapentin Other (See Comments)    Pt states that it causes her BP to drop.   . Naproxen Hives  . Nsaids Other (See Comments)    Reaction:  Unknown   . Tramadol Itching    Antimicrobials this admission: Vancomycin, cefepime  >>    >>   Dose adjustments this admission:   Microbiology results: 8/11 BCx: pending   8/11 MRSA PCR: pending      8/11 UA: LE(+) NO2 (+) WBC TNTC 8/11 CXR: bibasilar opacities, atelectasis vs. pneumonia  Thank you for allowing pharmacy to be a part of this patient's care.  Kodi Guerrera S 03/20/2017 5:52 AM

## 2017-03-20 NOTE — Plan of Care (Signed)
Problem: Skin Integrity: Goal: Risk for impaired skin integrity will decrease Outcome: Progressing Patient turned and repositioned q2hours, no new skin breakdown noted this at this time  Problem: Tissue Perfusion: Goal: Risk factors for ineffective tissue perfusion will decrease Outcome: Progressing Patient on ventilator, RT weaning settings as applicable.   Problem: Activity: Goal: Risk for activity intolerance will decrease Outcome: Progressing Patient becoming more responsive, opening eyes spontaneously, grimacing with physical stimuli   Problem: Fluid Volume: Goal: Ability to maintain a balanced intake and output will improve Outcome: Not Progressing Bowls sounds positive; OG tube present  Comments: Emergency Contact, [name], arrived this shift to check on patient- states she received call from [employer] stating patient was experiencing "Altered Mental Status".  Additional visitors in to see patient and inquiring about patient's status, Ms. [name], called and Password setup to protect allow for protection of patient's information. Patient tolerating ventilator, settings adjusted by RT as needed. Levo drip titrated for ordered blood pressure parameters, and fentanyl infusing to keep patient comfortable. Patient arousable and responsive to stimuli. Per Dr. Juanell Fairly, ok to utilize OG tube- PO meds given via tube. MD notified of critical troponin. SQ heparin given for VTE.

## 2017-03-20 NOTE — Progress Notes (Signed)
Sputum specimen obtained and sent to lab for analysis. 

## 2017-03-20 NOTE — ED Provider Notes (Signed)
[facility] Emergency Department Provider Note   ____________________________________________   First MD Initiated Contact with Patient 03/20/17 0211     (approximate)  I have reviewed the triage vital signs and the nursing notes.   HISTORY  Chief Complaint Loss of Consciousness and Respiratory Distress  Limited by decreased LOC  HPI Meghan [name] is a 71 y.o. female brought to the ED from [facility] health care with a chief complaint of unresponsive. Patient has a history of COPD, CHF on continuous oxygen who was found unresponsive in her bed.CPAP placed by EMS for rales heard on exam. Further history is unobtainable secondary to patient unresponsive.   Past Medical History:  Diagnosis Date  . Anginal pain ([place])   . Anxiety   . Arthritis    RA  . Asthma   . Brain tumor (benign) ([place])   . CHF (congestive heart failure) ([place])   . Collagen vascular disease ([place])   . COPD (chronic obstructive pulmonary disease) ([place])   . Coronary artery disease   . DDD (degenerative disc disease)   . Depression   . Diabetes mellitus   . GERD (gastroesophageal reflux disease)   . Headache   . Heart murmur   . Heart murmur   . Hypercholesteremia   . Hypertension   . Lumbar degenerative disc disease   . Migraines   . Obese   . Obesity   . Osteopenia   . Osteoporosis   . Pneumonia   . PTSD (post-traumatic stress disorder)     Patient Active Problem List   Diagnosis Date Noted  . HCAP (healthcare-associated pneumonia) 03/20/2017  . Acute on chronic respiratory failure with hypoxia ([place]) 03/20/2017  . Acute respiratory failure with hypoxia ([place]) 03/20/2017  . Near syncope 02/27/2017  . Dehydration 02/27/2017  . Colitis 02/22/2017  . Seizure ([place]) 01/03/2016  . Chronic tension-type headache, intractable 12/04/2015  . 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 Degeneration of intervertebral disc of lumbar region 07/15/2015  . 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 Asthma with acute exacerbation 03/10/2015  . Hypokalemia 03/01/2015  . Hyponatremia 03/01/2015  . DDD (degenerative disc disease), lumbar 01/31/2015  . Arthritis, degenerative 01/31/2015  . Rheumatoid arthritis with rheumatoid factor ([place]) 01/31/2015  . HTN (hypertension) 12/24/2014  . Sepsis ([place]) 12/24/2014  . Left knee pain 12/24/2014  . GERD (gastroesophageal reflux disease) 12/24/2014  . COPD (chronic obstructive pulmonary disease) ([place]) 12/24/2014  . Depression 12/24/2014  . Anxiety 12/24/2014  . Severe bipolar disorder with psychotic features, mood-congruent ([place]) 11/16/2014  . Neurosis, posttraumatic 11/16/2014  . H/O gastric ulcer 11/16/2014  . Barton's fracture of distal radius, closed 09/19/2014  . Neuritis or radiculitis due to rupture of lumbar intervertebral disc 05/11/2014  . Cervico-occipital neuralgia 03/26/2014  . Difficulty in walking 03/26/2014  . Difficulty in walking, not elsewhere classified 03/26/2014  . Cervical spine syndrome 01/26/2014  . Cephalalgia 01/09/2014  . Disordered sleep 01/09/2014  . BP (high blood pressure) 10/19/2013  . Adiposity 10/19/2013  . Cardiac murmur 10/19/2013  . PNA (pneumonia) 10/19/2013  . Breath shortness 10/19/2013  . Chronic obstructive pulmonary disease ([place]) 10/19/2013  . Diabetes mellitus ([place]) 10/19/2013    Past Surgical History:  Procedure Laterality Date  . ABDOMINAL HYSTERECTOMY    . CERVICAL FUSION    . CHOLECYSTECTOMY    . COLONOSCOPY WITH PROPOFOL N/A 09/20/2015   Procedure: COLONOSCOPY WITH PROPOFOL;  Surgeon: Josefine Class, MD;  Location: [facility] ENDOSCOPY;  Service: Endoscopy;  Laterality: N/A;  .  COLONOSCOPY WITH PROPOFOL N/A 01/27/2016   Procedure: COLONOSCOPY WITH PROPOFOL;  Surgeon: Manya Silvas, MD;  Location: [facility] ENDOSCOPY;  Service: Endoscopy;  Laterality: N/A;  . FINGER ARTHROPLASTY  02/23/2012   Procedure: FINGER ARTHROPLASTY;  Surgeon: Cammie Sickle., MD;  Location:  [place];  Service: Orthopedics;  Laterality: Left;  Extensor carpi radialis longus to Extensor carpi ulnaris transfer, left Metaphalangeal reconstructions of index and long fingers,  . FOOT ARTHROPLASTY     toes x2 rt foot  . HAND RECONSTRUCTION  2011   right-multiple finger joint reconst  . JOINT REPLACEMENT     bilat knee replacements    Prior to Admission medications   Medication Sig Start Date End Date Taking? Authorizing Provider  budesonide (PULMICORT) 0.5 MG/2ML nebulizer solution Take 2 mLs (0.5 mg total) by nebulization 2 (two) times daily. 03/02/17  Yes Max Sane, MD  diphenoxylate-atropine (LOMOTIL) 2.5-0.025 MG tablet Take 1 tablet by mouth 3 (three) times daily as needed for diarrhea or loose stools.   Yes [provider]  escitalopram (LEXAPRO) 10 MG tablet Take 1 tablet (10 mg total) by mouth every morning. 09/11/16  Yes Rainey Pines, MD  ipratropium-albuterol (DUONEB) 0.5-2.5 (3) MG/3ML SOLN Take 3 mLs by nebulization 2 (two) times daily. 03/04/17  Yes Wieting, Richard, MD  isosorbide mononitrate (IMDUR) 30 MG 24 hr tablet Take 30 mg by mouth daily.   Yes [provider]  lamoTRIgine (LAMICTAL) 100 MG tablet Take 1 tablet (100 mg total) by mouth daily. 06/02/16  Yes Rainey Pines, MD  meloxicam (MOBIC) 7.5 MG tablet Take 7.5 mg by mouth daily.  04/15/16  Yes [provider]  metoprolol tartrate (LOPRESSOR) 25 MG tablet Take 1 tablet (25 mg total) by mouth 2 (two) times daily. 03/02/17  Yes Max Sane, MD  mirtazapine (REMERON) 15 MG tablet Take 15 mg by mouth at bedtime.   Yes [provider]  montelukast (SINGULAIR) 10 MG tablet Take 10 mg by mouth daily.   Yes [provider]  omeprazole (PRILOSEC) 20 MG capsule Take 20 mg by mouth at bedtime.   Yes [provider]  ondansetron (ZOFRAN) 4 MG tablet Take 4 mg by mouth 3 (three) times daily.   Yes [provider]  pramipexole (MIRAPEX) 0.25 MG tablet  Take 0.25 mg by mouth.   Yes [provider]  pregabalin (LYRICA) 50 MG capsule TAKE ONE CAPSULE BY MOUTH TWICE A DAY 06/18/15  Yes [provider]  saxagliptin HCl (ONGLYZA) 5 MG TABS tablet Take 5 mg by mouth at bedtime.   Yes [provider]  topiramate (TOPAMAX) 25 MG tablet Take 25 mg by mouth 2 (two) times daily.   Yes [provider]  insulin starter kit- pen needles MISC 1 kit by Other route once. 03/09/15   Aldean Jewett, MD  ONE TOUCH ULTRA TEST test strip  02/19/15   [provider]  Syringe, Disposable, 1 ML MISC 300 Syringes by Does not apply route daily. 03/11/15   Aldean Jewett, MD    Allergies Gabapentin; Naproxen; Nsaids; and Tramadol  Family History  Problem Relation Age of Onset  . Depression Sister   . Hypertension Mother   . Arthritis/Rheumatoid Mother   . Heart attack Father   . Hypertension Father   . CAD Unknown   . Hypertension Unknown   . Diabetes Mellitus II Unknown   . Arthritis Unknown     Social History Social History  Substance Use  Topics  . Smoking status: Never Smoker  . Smokeless tobacco: Never Used  . Alcohol use No    Review of Systems  Constitutional: No fever/chills. Eyes: No visual changes. ENT: No sore throat. Cardiovascular: Denies chest pain. Respiratory: Positive for shortness of breath. Gastrointestinal: No abdominal pain.  No nausea, no vomiting.  No diarrhea.  No constipation. Genitourinary: Negative for dysuria. Musculoskeletal: Negative for back pain. Skin: Negative for rash. Neurological: Negative for headaches, focal weakness or numbness.  Limited by unresponsiveness  ____________________________________________   PHYSICAL EXAM:  VITAL SIGNS: ED Triage Vitals  Enc Vitals Group     BP 03/20/17 0224 127/76     Pulse Rate 03/20/17 0224 99     Resp 03/20/17 0224 (!) 30     Temp --      Temp src --      SpO2 03/20/17 0224 91 %     Weight 03/20/17 0209 150 lb (68  kg)     Height 03/20/17 0209 4' 8"  (1.422 m)     Head Circumference --      Peak Flow --      Pain Score --      Pain Loc --      Pain Edu? --      Excl. in [place]? --     Constitutional: Decreased LOC. Arousable with painful stimulus. Severe distress. Eyes: Conjunctivae are normal. PERRL. EOMI. Head: Atraumatic. Nose: No congestion/rhinnorhea. Mouth/Throat: Mucous membranes are moist.  Oropharynx non-erythematous. Neck: No stridor.   Cardiovascular: Normal rate, regular rhythm. Grossly normal heart sounds.  Good peripheral circulation. Respiratory: Increased respiratory effort.  Lungs with diffuse rales. Gastrointestinal: Soft and nontender. No distention. No abdominal bruits. No CVA tenderness. Musculoskeletal: No lower extremity tenderness nor edema.  No joint effusions. Neurologic:  Unresponsive. Skin:  Skin is cool, dry and intact. No rash noted. Psychiatric: Unable to determine secondary to distress. ____________________________________________   LABS (all labs ordered are listed, but only abnormal results are displayed)  Labs Reviewed  COMPREHENSIVE METABOLIC PANEL - Abnormal; Notable for the following:       Result Value   Potassium 3.1 (*)    Glucose, Bld 218 (*)    Calcium 8.3 (*)    Total Protein 5.8 (*)    Albumin 2.7 (*)    All other components within normal limits  BRAIN NATRIURETIC PEPTIDE - Abnormal; Notable for the following:    B Natriuretic Peptide 1,333.0 (*)    All other components within normal limits  BLOOD GAS, ARTERIAL - Abnormal; Notable for the following:    pH, Arterial 7.30 (*)    pCO2 arterial 56 (*)    pO2, Arterial 68 (*)    All other components within normal limits  CBC WITH DIFFERENTIAL/PLATELET - Abnormal; Notable for the following:    RDW 17.3 (*)    Lymphs Abs 0.3 (*)    All other components within normal limits  CULTURE, BLOOD (ROUTINE X 2)  CULTURE, BLOOD (ROUTINE X 2)  TROPONIN I  LACTIC ACID, PLASMA  CBC WITH  DIFFERENTIAL/PLATELET  LACTIC ACID, PLASMA  URINALYSIS, ROUTINE W REFLEX MICROSCOPIC  BLOOD GAS, ARTERIAL  I-STAT CG4 LACTIC ACID, ED  I-STAT CG4 LACTIC ACID, ED   ____________________________________________  EKG  ED ECG REPORT I, Urie Loughner J, the attending physician, personally viewed and interpreted this ECG.   Date: 03/20/2017  EKG Time: 0212  Rate: 112  Rhythm: sinus tachycardia  Axis: LAD  Intervals:nonspecific intraventricular conduction delay  ST&T Change: Nonspecific  ____________________________________________  RADIOLOGY  Dg Chest Port 1 View  Result Date: 03/20/2017 CLINICAL DATA:  Endotracheal tube placement.  Initial encounter. EXAM: PORTABLE CHEST 1 VIEW COMPARISON:  Chest radiograph performed earlier today at 2:18 a.m. FINDINGS: The patient's endotracheal tube is seen ending 2-3 cm above the carina. An enteric tube is noted extending below the diaphragm. There is mild elevation of the right hemidiaphragm. Vascular congestion is noted. Increased interstitial markings raise concern for pulmonary edema. Small bilateral pleural effusions are noted. No pneumothorax is seen. The cardiomediastinal silhouette is mildly enlarged. No acute osseous abnormalities are identified. External pacing pads are noted. IMPRESSION: 1. Endotracheal tube seen ending 2-3 cm above the carina. 2. Mild elevation of the right hemidiaphragm. Vascular congestion and mild cardiomegaly. Increased interstitial markings raise concern for pulmonary edema. Small bilateral pleural effusions seen. Electronically Signed   By: Garald Balding M.D.   On: 03/20/2017 03:08   Dg Chest Port 1 View  Result Date: 03/20/2017 CLINICAL DATA:  Patient found unresponsive. Rhonchi. Initial encounter. EXAM: PORTABLE CHEST 1 VIEW COMPARISON:  Chest radiograph performed 02/26/2017 FINDINGS: There is mild elevation of the right hemidiaphragm. Small bilateral pleural effusions are noted. Bibasilar airspace opacities may reflect  atelectasis or pneumonia. Haziness within the left lung raises concern for mild superimposed interstitial edema. No pneumothorax is seen. The cardiomediastinal silhouette is mildly enlarged. No acute osseous abnormalities are seen. IMPRESSION: Mild elevation of the right hemidiaphragm. Small bilateral pleural effusions. Bibasilar airspace opacities may reflect atelectasis or pneumonia. Haziness within the left lung raises concern for mild superimposed interstitial edema. Mild cardiomegaly. Electronically Signed   By: Garald Balding M.D.   On: 03/20/2017 02:45    ____________________________________________   PROCEDURES  Procedure(s) performed:   INTUBATION Performed by: Paulette Blanch  Required items: required blood products, implants, devices, and special equipment available Patient identity confirmed: provided demographic data and hospital-assigned identification number Time out: Immediately prior to procedure a "time out" was called to verify the correct patient, procedure, equipment, support staff and site/side marked as required.  Indications: Respiratory failure  Intubation method: Glidescope Laryngoscopy   Preoxygenation: BVM  Sedatives: Etomidate Paralytic: Rocuronium  Tube Size: 7.5 cuffed  Post-procedure assessment: chest rise and ETCO2 monitor Breath sounds: equal and absent over the epigastrium Tube secured with: ETT holder Chest x-ray interpreted by radiologist and me.  Chest x-ray findings: endotracheal tube in appropriate position  Patient tolerated the procedure well with no immediate complications.    Procedures  Critical Care performed: Yes, see critical care note(s)   CRITICAL CARE Performed by: Paulette Blanch   Total critical care time: 45 minutes  Critical care time was exclusive of separately billable procedures and treating other patients.  Critical care was necessary to treat or prevent imminent or life-threatening deterioration.  Critical care  was time spent personally by me on the following activities: development of treatment plan with patient and/or surrogate as well as nursing, discussions with consultants, evaluation of patient's response to treatment, examination of patient, obtaining history from patient or surrogate, ordering and performing treatments and interventions, ordering and review of laboratory studies, ordering and review of radiographic studies, pulse oximetry and re-evaluation of patient's condition.  ____________________________________________   INITIAL IMPRESSION / ASSESSMENT AND PLAN / ED COURSE  Pertinent labs & imaging results that were available during my care of the patient were reviewed by me and considered in my medical decision making (see chart for details).  71 year old female with COPD and CHF from nursing home in severe  respiratory distress/failure. On BiPAP, saturations are 83%. Will prepare for intubation.   Clinical Course as of Mar 20 632  Sat Mar 20, 2017  6825 Patient tolerated intubation. Remains hypotensive. On dopamine drip. Will also administer Levophed. Code sepsis initiated. Discussed with both hospitalist as well as critical care NP for admission.  [JS]  [phone] Spoke with patient's grand-daughter Maia Plan via telephone. Updated her of patient's ED course and serious condition.  [JS]    Clinical Course User Index [JS] Paulette Blanch, MD     ____________________________________________   FINAL CLINICAL IMPRESSION(S) / ED DIAGNOSES  Final diagnoses:  Acute on chronic respiratory failure with hypoxia (HCC)  Sepsis, due to unspecified organism ([place])  Hypotension, unspecified hypotension type  Hypothermia, initial encounter      NEW MEDICATIONS STARTED DURING THIS VISIT:  New Prescriptions   No medications on file     Note:  This document was prepared using Dragon voice recognition software and may include unintentional dictation errors.    Paulette Blanch,  MD 03/20/17 [phone]

## 2017-03-20 NOTE — Progress Notes (Signed)
[facility] [facility] [place] NAME: Meghan [name]    MR#:  [id number]  DATE OF BIRTH:  15-May-1946  SUBJECTIVE:  CHIEF COMPLAINT:   Chief Complaint  Patient presents with  . Loss of Consciousness  . Respiratory Distress   - Recent discharge from the hospital to rehabilitation, was brought in secondary to unresponsive episode and was noted to have acute pulmonary edema. -Intubated and sedated this morning  REVIEW OF SYSTEMS:  Review of Systems  Unable to perform ROS: Critical illness    DRUG ALLERGIES:   Allergies  Allergen Reactions  . Gabapentin Other (See Comments)    Pt states that it causes her BP to drop.   . Naproxen Hives  . Nsaids Other (See Comments)    Reaction:  Unknown   . Tramadol Itching    VITALS:  Blood pressure 98/61, pulse 95, temperature 99.5 F (37.5 C), resp. rate 18, height 5\' 1"  (1.549 m), weight 63.5 kg (139 lb 15.9 oz), SpO2 98 %.  PHYSICAL EXAMINATION:  Physical Exam  GENERAL:  71 y.o.-year-old Elderly patient lying in bed, intubated on the ventilator EYES: Pupils equal, round, reactive to light and accommodation. No scleral icterus. Extraocular muscles intact.  HEENT: Head atraumatic, normocephalic. Oropharynx and nasopharynx clear. Orally intubated, has an OG tube NECK:  Supple, no jugular venous distention. No thyroid enlargement, no tenderness.  LUNGS: Normal breath sounds bilaterally, no wheezing, rales,rhonchi or crepitation. No use of accessory muscles of respiration. Decreased bibasilar breath sounds CARDIOVASCULAR: S1, S2 normal. No rubs, or gallops. 3/6 systolic murmur present ABDOMEN: Soft, nontender, nondistended. Bowel sounds present. No organomegaly or mass.  EXTREMITIES: No pedal edema, cyanosis, or clubbing.  NEUROLOGIC: No obvious facial droop noted. Patient is sedated and not following any commands  PSYCHIATRIC: The patient is sedated.  SKIN: No obvious rash, lesion, or ulcer.     LABORATORY PANEL:   CBC  Recent Labs Lab 03/20/17 0800  WBC 9.5  HGB 13.2  HCT 39.1  PLT 279   ------------------------------------------------------------------------------------------------------------------  Chemistries   Recent Labs Lab 03/20/17 0209 03/20/17 0800  NA 141 141  K 3.1* 3.1*  CL 110 110  CO2 23 24  GLUCOSE 218* 173*  BUN 12 11  CREATININE 0.64 0.39*  CALCIUM 8.3* 7.7*  MG 1.6*  --   AST 33  --   ALT 17  --   ALKPHOS 119  --   BILITOT 1.0  --    ------------------------------------------------------------------------------------------------------------------  Cardiac Enzymes  Recent Labs Lab 03/20/17 0209  TROPONINI <0.03   ------------------------------------------------------------------------------------------------------------------  RADIOLOGY:  Dg Abd 1 View  Result Date: 03/20/2017 CLINICAL DATA:  Assess orogastric tube placement. EXAM: ABDOMEN - 1 VIEW COMPARISON:  CT abdomen and pelvis February 22, 2017 FINDINGS: Nasogastric tube looped in mid stomach. Tip projects back in proximal to mid stomach. Paucity of bowel gas with moderate amount of retained large bowel stool. Surgical clips in the included right abdomen compatible with cholecystectomy. Soft tissue planes and included osseous structure nonsuspicious. Multiple EKG lines and pacer pads overlie the patient and may obscure subtle underlying pathology. IMPRESSION: Nasogastric tube tip projects in proximal to mid stomach. Electronically Signed   By: Elon Alas M.D.   On: 03/20/2017 04:48   Dg Chest Port 1 View  Result Date: 03/20/2017 CLINICAL DATA:  Central line placement.  Initial encounter. EXAM: PORTABLE CHEST 1 VIEW COMPARISON:  Chest radiograph performed earlier today at 2:56 a.m. FINDINGS: The patient's endotracheal tube is  seen ending 1-2 cm above the carina. This could be retracted 1-2 cm. The right IJ line is noted ending about the proximal right atrium. This could be  retracted 3 cm, as deemed clinically appropriate. The enteric tube is noted extending below the diaphragm. Vascular congestion is again noted, with worsening increased interstitial markings, concerning for worsening pulmonary edema. Small bilateral pleural effusions are noted. No pneumothorax is seen. The cardiomediastinal silhouette is mildly enlarged. No acute osseous abnormalities are identified. An external pacing pad is noted. IMPRESSION: 1. Endotracheal tube seen ending 1-2 cm above the carina. This could be retracted 1-2 cm. 2. Right IJ line noted ending about the proximal right atrium. This could be retracted 3 cm, as deemed clinically appropriate. 3. Vascular congestion and mild cardiomegaly. Worsening increased interstitial markings are concerning for worsening pulmonary edema. Small bilateral pleural effusions noted. Electronically Signed   By: Garald Balding M.D.   On: 03/20/2017 06:42   Dg Chest Port 1 View  Result Date: 03/20/2017 CLINICAL DATA:  Endotracheal tube placement.  Initial encounter. EXAM: PORTABLE CHEST 1 VIEW COMPARISON:  Chest radiograph performed earlier today at 2:18 a.m. FINDINGS: The patient's endotracheal tube is seen ending 2-3 cm above the carina. An enteric tube is noted extending below the diaphragm. There is mild elevation of the right hemidiaphragm. Vascular congestion is noted. Increased interstitial markings raise concern for pulmonary edema. Small bilateral pleural effusions are noted. No pneumothorax is seen. The cardiomediastinal silhouette is mildly enlarged. No acute osseous abnormalities are identified. External pacing pads are noted. IMPRESSION: 1. Endotracheal tube seen ending 2-3 cm above the carina. 2. Mild elevation of the right hemidiaphragm. Vascular congestion and mild cardiomegaly. Increased interstitial markings raise concern for pulmonary edema. Small bilateral pleural effusions seen. Electronically Signed   By: Garald Balding M.D.   On: 03/20/2017 03:08     Dg Chest Port 1 View  Result Date: 03/20/2017 CLINICAL DATA:  Patient found unresponsive. Rhonchi. Initial encounter. EXAM: PORTABLE CHEST 1 VIEW COMPARISON:  Chest radiograph performed 02/26/2017 FINDINGS: There is mild elevation of the right hemidiaphragm. Small bilateral pleural effusions are noted. Bibasilar airspace opacities may reflect atelectasis or pneumonia. Haziness within the left lung raises concern for mild superimposed interstitial edema. No pneumothorax is seen. The cardiomediastinal silhouette is mildly enlarged. No acute osseous abnormalities are seen. IMPRESSION: Mild elevation of the right hemidiaphragm. Small bilateral pleural effusions. Bibasilar airspace opacities may reflect atelectasis or pneumonia. Haziness within the left lung raises concern for mild superimposed interstitial edema. Mild cardiomegaly. Electronically Signed   By: Garald Balding M.D.   On: 03/20/2017 02:45    EKG:   Orders placed or performed during the hospital encounter of 03/20/17  . ED EKG  . ED EKG  . EKG 12-Lead  . EKG 12-Lead    ASSESSMENT AND PLAN:   71 year old female with past medical history significant for CAD, arthritis, anxiety, diastolic CHF, COPD, diabetes, hypertension, recent admission for C. difficile colitis and bronchitis last week presents from rehabilitation secondary to altered mental status.  #1 altered mental status-likely metabolic encephalopathy -Secondary to sepsis. Currently on fentanyl for sedation -CT of the head not done on this admission. Ct head done about 2 weeks ago showing stable atrophy and chronic small vessel ischemic disease and history of right suboccipital craniectomy with underlying right cerebellar encephalomalacia.  #2 acute hypoxic respiratory failure-secondary to pulmonary edema -Likely diastolic dysfunction. -Patient intubated, on minimal vent settings at this time. -Management per pulmonary -On Lasix, consider repeat ECHO  #  3 sepsis-on  low-dose Levophed. -Likely source urinary tract infection. Follow up cultures -On vancomycin and cefepime at this time. Narrow antibiotics once culture results are available. -Monitor carefully as recent admission for C. difficile colitis 2 weeks ago.  #4 bipolar disorder and posttraumatic stress disorder-continue outpatient medications. Patient on Lamictal, topamax and Lexapro  #5 hypokalemia-being replaced  #6 DVT prophylaxis-on subcutaneous heparin   All the records are reviewed and case discussed with Care Management/Social Workerr. Management plans discussed with the patient, family and they are in agreement.  CODE STATUS: Full code  TOTAL TIME TAKING CARE OF THIS PATIENT: 36 minutes.   POSSIBLE D/C IN 2-3 DAYS, DEPENDING ON CLINICAL CONDITION.   Gladstone Lighter M.D on 03/20/2017 at 12:45 PM  Between 7am to 6pm - Pager - [phone]  After 6pm go to [email or web address] - password [email or web address] [facility]  Office  [phone]  CC: Primary care physician; Lavera Guise, MD

## 2017-03-20 NOTE — ED Notes (Signed)
Bear hugger placed on patient.

## 2017-03-20 NOTE — ED Notes (Signed)
Respiratory paged to bedside

## 2017-03-20 NOTE — Progress Notes (Signed)
This note also relates to the following rows which could not be included: SpO2 - Cannot attach notes to unvalidated device data  Decreased to 30%

## 2017-03-20 NOTE — Procedures (Signed)
Central Venous Catheter Insertion Procedure Note JONAYA FRESHOUR [id number] 03/08/1946  Procedure: Insertion of Central Venous Catheter Indications: Assessment of intravascular volume, Drug and/or fluid administration and Frequent blood sampling  Procedure Details Consent: Unable to obtain consent because of emergent medical necessity. Time Out: Verified patient identification, verified procedure, site/side was marked, verified correct patient position, special equipment/implants available, medications/allergies/relevent history reviewed, required imaging and test results available.  Performed  Maximum sterile technique was used including antiseptics, cap, gloves, gown, hand hygiene, mask and sheet. Skin prep: Chlorhexidine; local anesthetic administered A antimicrobial bonded/coated triple lumen catheter was placed in the right internal jugular vein using the Seldinger technique.  Evaluation Blood flow good Complications: No apparent complications Patient did tolerate procedure well. Chest X-ray ordered to verify placement.  CXR: normal.  Procedure performed under direct supervision of Dr.Beauden Tremont. Ultrasound utilized for realtime vessel cannulation  Magdalene S. [facility] ANP-BC Pulmonary and [place] Pager [phone] or [phone]  03/20/2017, 11:30 AM

## 2017-03-20 NOTE — ED Notes (Signed)
MD Beather Arbour ordered to slowly titrate dopamine down, and titrate levophed up until able to discontinue dopamine completely.

## 2017-03-20 NOTE — ED Notes (Signed)
7.5 airway inserted by MD sung; 18 at the gums; positive color change

## 2017-03-20 NOTE — ED Notes (Signed)
Matt RN made 2 unsuccessful attempts at peripheral IV insertion

## 2017-03-20 NOTE — ED Notes (Addendum)
MD Beather Arbour informed of patient's BP and oxygen saturation. MD to bedside

## 2017-03-20 NOTE — H&P (Signed)
[name] [place] NAME: Meghan [name]    MR#:  [id number]  DATE OF BIRTH:  07/05/1946  DATE OF ADMISSION:  03/20/2017  PRIMARY CARE PHYSICIAN: Lavera Guise, MD   REQUESTING/REFERRING PHYSICIAN: Beather Arbour, MD  CHIEF COMPLAINT:   Chief Complaint  Patient presents with  . Loss of Consciousness  . Respiratory Distress    HISTORY OF PRESENT ILLNESS:  Meghan [name]  is a 71 y.o. female who presents with Respiratory failure from nursing facility. Patient was found hypoxic, unresponsive, frothy foamy at the mouth. Here she was put on BiPAP, but was still persistently hypoxic and hypotensive. Chest x-ray shows small effusions, diffuse edema, right lower lobe opacity. She was treated per sepsis protocol, intubated, and hospitalists were called for admission to ICU.  PAST MEDICAL HISTORY:   Past Medical History:  Diagnosis Date  . Anginal pain ([place])   . Anxiety   . Arthritis    RA  . Asthma   . Brain tumor (benign) ([place])   . CHF (congestive heart failure) ([place])   . Collagen vascular disease ([place])   . COPD (chronic obstructive pulmonary disease) ([place])   . Coronary artery disease   . DDD (degenerative disc disease)   . Depression   . Diabetes mellitus   . GERD (gastroesophageal reflux disease)   . Headache   . Heart murmur   . Heart murmur   . Hypercholesteremia   . Hypertension   . Lumbar degenerative disc disease   . Migraines   . Obese   . Obesity   . Osteopenia   . Osteoporosis   . Pneumonia   . PTSD (post-traumatic stress disorder)     PAST SURGICAL HISTORY:   Past Surgical History:  Procedure Laterality Date  . ABDOMINAL HYSTERECTOMY    . CERVICAL FUSION    . CHOLECYSTECTOMY    . COLONOSCOPY WITH PROPOFOL N/A 09/20/2015   Procedure: COLONOSCOPY WITH PROPOFOL;  Surgeon: Josefine Class, MD;  Location[facility] ENDOSCOPY;  Service: Endoscopy;  Laterality: N/A;  . COLONOSCOPY WITH PROPOFOL N/A 01/27/2016   Procedure:  COLONOSCOPY WITH PROPOFOL;  Surgeon: Manya Silvas, MD;  Location: [facility] ENDOSCOPY;  Service: Endoscopy;  Laterality: N/A;  . FINGER ARTHROPLASTY  02/23/2012   Procedure: FINGER ARTHROPLASTY;  Surgeon: Cammie Sickle., MD;  Location: [place];  Service: Orthopedics;  Laterality: Left;  Extensor carpi radialis longus to Extensor carpi ulnaris transfer, left Metaphalangeal reconstructions of index and long fingers,  . FOOT ARTHROPLASTY     toes x2 rt foot  . HAND RECONSTRUCTION  2011   right-multiple finger joint reconst  . JOINT REPLACEMENT     bilat knee replacements    SOCIAL HISTORY:   Social History  Substance Use Topics  . Smoking status: Never Smoker  . Smokeless tobacco: Never Used  . Alcohol use No    FAMILY HISTORY:   Family History  Problem Relation Age of Onset  . Depression Sister   . Hypertension Mother   . Arthritis/Rheumatoid Mother   . Heart attack Father   . Hypertension Father   . CAD Unknown   . Hypertension Unknown   . Diabetes Mellitus II Unknown   . Arthritis Unknown     DRUG ALLERGIES:   Allergies  Allergen Reactions  . Gabapentin Other (See Comments)    Pt states that it causes her BP to drop.   . Naproxen Hives  . Nsaids Other (See Comments)  Reaction:  Unknown   . Tramadol Itching    MEDICATIONS AT HOME:   Prior to Admission medications   Medication Sig Start Date End Date Taking? Authorizing Provider  budesonide (PULMICORT) 0.5 MG/2ML nebulizer solution Take 2 mLs (0.5 mg total) by nebulization 2 (two) times daily. 03/02/17  Yes Max Sane, MD  diphenoxylate-atropine (LOMOTIL) 2.5-0.025 MG tablet Take 1 tablet by mouth 3 (three) times daily as needed for diarrhea or loose stools.   Yes [provider]  escitalopram (LEXAPRO) 10 MG tablet Take 1 tablet (10 mg total) by mouth every morning. 09/11/16  Yes Rainey Pines, MD  ipratropium-albuterol (DUONEB) 0.5-2.5 (3) MG/3ML SOLN Take 3 mLs by nebulization 2  (two) times daily. 03/04/17  Yes Wieting, Richard, MD  isosorbide mononitrate (IMDUR) 30 MG 24 hr tablet Take 30 mg by mouth daily.   Yes [provider]  lamoTRIgine (LAMICTAL) 100 MG tablet Take 1 tablet (100 mg total) by mouth daily. 06/02/16  Yes Rainey Pines, MD  meloxicam (MOBIC) 7.5 MG tablet Take 7.5 mg by mouth daily.  04/15/16  Yes [provider]  metoprolol tartrate (LOPRESSOR) 25 MG tablet Take 1 tablet (25 mg total) by mouth 2 (two) times daily. 03/02/17  Yes Max Sane, MD  mirtazapine (REMERON) 15 MG tablet Take 15 mg by mouth at bedtime.   Yes [provider]  montelukast (SINGULAIR) 10 MG tablet Take 10 mg by mouth daily.   Yes [provider]  omeprazole (PRILOSEC) 20 MG capsule Take 20 mg by mouth at bedtime.   Yes [provider]  ondansetron (ZOFRAN) 4 MG tablet Take 4 mg by mouth 3 (three) times daily.   Yes [provider]  pramipexole (MIRAPEX) 0.25 MG tablet Take 0.25 mg by mouth.   Yes [provider]  pregabalin (LYRICA) 50 MG capsule TAKE ONE CAPSULE BY MOUTH TWICE A DAY 06/18/15  Yes [provider]  saxagliptin HCl (ONGLYZA) 5 MG TABS tablet Take 5 mg by mouth at bedtime.   Yes [provider]  topiramate (TOPAMAX) 25 MG tablet Take 25 mg by mouth 2 (two) times daily.   Yes [provider]  insulin starter kit- pen needles MISC 1 kit by Other route once. 03/09/15   Aldean Jewett, MD  ONE TOUCH ULTRA TEST test strip  02/19/15   [provider]  Syringe, Disposable, 1 ML MISC 300 Syringes by Does not apply route daily. 03/11/15   Aldean Jewett, MD    REVIEW OF SYSTEMS:  Review of Systems  Unable to perform ROS: Critical illness     VITAL SIGNS:   Vitals:   03/20/17 0209 03/20/17 0224 03/20/17 0230 03/20/17 0235  BP:  127/76 (!) 74/46 (!) 75/50  Pulse:  99 93   Resp:  (!) 30 (!) 24   SpO2:  91% (!) 88%   Weight: 68 kg (150 lb)     Height: 4' 8" (1.422 m)       Wt Readings from Last 3 Encounters:  03/20/17 68 kg (150 lb)  03/04/17 65.3 kg (143 lb 14.4 oz)  02/22/17 58.5 kg (129 lb)    PHYSICAL EXAMINATION:  Physical Exam  Vitals reviewed. Constitutional: She appears well-developed and well-nourished.  HENT:  Head: Normocephalic and atraumatic.  Mouth/Throat: Oropharynx is clear and moist.  Eyes: Pupils are equal, round, and reactive to light. Conjunctivae and EOM are normal. No scleral icterus.  Neck: Normal range of motion. Neck supple. No JVD present. No thyromegaly  present.  Cardiovascular: Regular rhythm and intact distal pulses.  Exam reveals no gallop and no friction rub.   No murmur heard. Tachycardic  Respiratory: She is in respiratory distress (Intubated and mechanically ventilated). She has no wheezes. She has rales.  Diffuse fine crackles throughout both lungs, coarse rhonchi right lower lobe  GI: Soft. Bowel sounds are normal. She exhibits no distension. There is no tenderness.  Musculoskeletal: Normal range of motion. She exhibits no edema.  No arthritis, no gout  Lymphadenopathy:    She has no cervical adenopathy.  Neurological:  Unable to assess due to patient condition  Skin: Skin is warm and dry. No rash noted. No erythema.  Psychiatric:  Unable to assess due to patient condition    LABORATORY PANEL:   CBC  Recent Labs Lab 03/20/17 0209  WBC 5.2  HGB 13.4  HCT 40.3  PLT 207   ------------------------------------------------------------------------------------------------------------------  Chemistries  No results for input(s): NA, K, CL, CO2, GLUCOSE, BUN, CREATININE, CALCIUM, MG, AST, ALT, ALKPHOS, BILITOT in the last 168 hours.  Invalid input(s): GFRCGP ------------------------------------------------------------------------------------------------------------------  Cardiac Enzymes No results for input(s): TROPONINI in the last 168  hours. ------------------------------------------------------------------------------------------------------------------  RADIOLOGY:  Dg Chest Port 1 View  Result Date: 03/20/2017 CLINICAL DATA:  Patient found unresponsive. Rhonchi. Initial encounter. EXAM: PORTABLE CHEST 1 VIEW COMPARISON:  Chest radiograph performed 02/26/2017 FINDINGS: There is mild elevation of the right hemidiaphragm. Small bilateral pleural effusions are noted. Bibasilar airspace opacities may reflect atelectasis or pneumonia. Haziness within the left lung raises concern for mild superimposed interstitial edema. No pneumothorax is seen. The cardiomediastinal silhouette is mildly enlarged. No acute osseous abnormalities are seen. IMPRESSION: Mild elevation of the right hemidiaphragm. Small bilateral pleural effusions. Bibasilar airspace opacities may reflect atelectasis or pneumonia. Haziness within the left lung raises concern for mild superimposed interstitial edema. Mild cardiomegaly. Electronically Signed   By: Garald Balding M.D.   On: 03/20/2017 02:45    EKG:   Orders placed or performed during the hospital encounter of 03/20/17  . ED EKG  . ED EKG  . EKG 12-Lead  . EKG 12-Lead    IMPRESSION AND PLAN:  Principal Problem:   Sepsis ([place]) - IV antibiotics, IV pressors, cultures sent from the ED Active Problems:   HCAP (healthcare-associated pneumonia) - versus possible aspiration pneumonia, IV antibiotics and cultures, patient is intubated and mechanically ventilated   Acute on chronic respiratory failure with hypoxia ([place]) - due to pneumonia as well as heart failure, patient is intubated and ventilated, admit to ICU   Acute on chronic systolic CHF - patient is currently on pressors due to hypotension, we will use as possible, but this accident difficult to administer any diuresis at this time. She is mechanically ventilated which may help some with her interstitial edema.   COPD (chronic obstructive pulmonary  disease) ([place]) - continue home nebulizers   Diabetes mellitus (HCC) - sliding scale insulin with corresponding glucose checks   GERD (gastroesophageal reflux disease) - PPI  All the records are reviewed and case discussed with ED provider. Management plans discussed with the patient and/or family.  DVT PROPHYLAXIS: SubQ heparin  GI PROPHYLAXIS: PPI  ADMISSION STATUS: Inpatient  CODE STATUS: Full Code Status History    Date Active Date Inactive Code Status Order ID Comments User Context   02/27/2017  5:39 AM 03/04/2017  6:14 PM Full Code [id number]  Saundra Shelling, MD Inpatient   02/22/2017  3:47 PM 02/26/2017 11:05 PM Full Code [id number]  Vaughan Basta, MD  Inpatient   03/01/2015  9:12 PM 03/10/2015  8:17 PM Full Code [id number]  Demetrios Loll, MD Inpatient   12/24/2014  5:27 AM 12/25/2014  6:32 PM DNR [id number]  Lance Coon, MD Inpatient    Advance Directive Documentation     Most Recent Value  Type of Advance Directive  Living will  Pre-existing out of facility DNR order (yellow form or pink MOST form)  -  "MOST" Form in Place?  -      TOTAL CRITICAL CARE TIME TAKING CARE OF THIS PATIENT: 50 minutes.   Jannifer Franklin,  [place] 03/20/2017, 2:54 AM  [facility]  Office  [phone]  CC: Primary care physician; Lavera Guise, MD  Note:  This document was prepared using Dragon voice recognition software and may include unintentional dictation errors.

## 2017-03-20 NOTE — Consult Note (Signed)
PULMONARY / CRITICAL CARE MEDICINE   Name: Meghan [name] MRN: [id number] DOB: Apr 28, 1946    ADMISSION DATE:  03/20/2017   CONSULTATION DATE:  03/19/17  REFERRING MD:  Dr. Jannifer Franklin  REASON: Acute respiratory failure  CHIEF COMPLAINT:  Acute respiratory failure, loss of consciousness  HISTORY OF PRESENT ILLNESS:   This is a 71 year old nursing home resident with past medical history as indicated below was found unresponsive by nursing home staff. Upon EMS arrival, patient was hypothermic, unresponsive, and hypoxic with an unpredictable SPO2. EMS also noted that patient had frothy oral secretions. She was placed on BiPAP and transported to the ED. Upon arrival in the ED, patient was persistently hypoxic, hypotensive and hence was intubated. ABG post intubation showed a pH of 7.3, PCO2 of 56, PO2 of 68, and a bicarbonate level of 27. WBC was normal, but her urinalysis was positive for nitrites. A potassium was low at 3.1, and her magnesium was 1.6. Hypothermic with a temperature of 34.9C., ProBNP was elevated at 1300, but it is unclear what her baseline She is currently on dopamine and Levophed  PAST MEDICAL HISTORY :  She  has a past medical history of Anginal pain ([place]); Anxiety; Arthritis; Asthma; Brain tumor (benign) ([place]); CHF (congestive heart failure) ([place]); Collagen vascular disease ([place]); COPD (chronic obstructive pulmonary disease) ([place]); Coronary artery disease; DDD (degenerative disc disease); Depression; Diabetes mellitus; GERD (gastroesophageal reflux disease); Headache; Heart murmur; Heart murmur; Hypercholesteremia; Hypertension; Lumbar degenerative disc disease; Migraines; Obese; Obesity; Osteopenia; Osteoporosis; Pneumonia; and PTSD (post-traumatic stress disorder).  PAST SURGICAL HISTORY: She  has a past surgical history that includes Hand reconstruction (2011); Abdominal hysterectomy; Cervical fusion; Joint replacement; Cholecystectomy; Foot arthroplasty; Finger arthroplasty  (02/23/2012); Colonoscopy with propofol (N/A, 09/20/2015); and Colonoscopy with propofol (N/A, 01/27/2016).  Allergies  Allergen Reactions  . Gabapentin Other (See Comments)    Pt states that it causes her BP to drop.   . Naproxen Hives  . Nsaids Other (See Comments)    Reaction:  Unknown   . Tramadol Itching    No current facility-administered medications on file prior to encounter.    Current Outpatient Prescriptions on File Prior to Encounter  Medication Sig  . budesonide (PULMICORT) 0.5 MG/2ML nebulizer solution Take 2 mLs (0.5 mg total) by nebulization 2 (two) times daily.  . diphenoxylate-atropine (LOMOTIL) 2.5-0.025 MG tablet Take 1 tablet by mouth 3 (three) times daily as needed for diarrhea or loose stools.  Marland Kitchen escitalopram (LEXAPRO) 10 MG tablet Take 1 tablet (10 mg total) by mouth every morning.  Marland Kitchen ipratropium-albuterol (DUONEB) 0.5-2.5 (3) MG/3ML SOLN Take 3 mLs by nebulization 2 (two) times daily.  . isosorbide mononitrate (IMDUR) 30 MG 24 hr tablet Take 30 mg by mouth daily.  Marland Kitchen lamoTRIgine (LAMICTAL) 100 MG tablet Take 1 tablet (100 mg total) by mouth daily.  . meloxicam (MOBIC) 7.5 MG tablet Take 7.5 mg by mouth daily.   . metoprolol tartrate (LOPRESSOR) 25 MG tablet Take 1 tablet (25 mg total) by mouth 2 (two) times daily.  . montelukast (SINGULAIR) 10 MG tablet Take 10 mg by mouth daily.  . ondansetron (ZOFRAN) 4 MG tablet Take 4 mg by mouth 3 (three) times daily.  . pramipexole (MIRAPEX) 0.25 MG tablet Take 0.25 mg by mouth.  . pregabalin (LYRICA) 50 MG capsule TAKE ONE CAPSULE BY MOUTH TWICE A DAY  . saxagliptin HCl (ONGLYZA) 5 MG TABS tablet Take 5 mg by mouth at bedtime.  . topiramate (TOPAMAX) 25 MG tablet Take  25 mg by mouth 2 (two) times daily.  . insulin starter kit- pen needles MISC 1 kit by Other route once.  . ONE TOUCH ULTRA TEST test strip   . Syringe, Disposable, 1 ML MISC 300 Syringes by Does not apply route daily.    FAMILY HISTORY:  Her indicated that  her mother is alive. She indicated that her father is deceased. She indicated that both of her sisters are alive. She indicated that her brother is alive. She indicated that the status of her unknown relative is unknown.    SOCIAL HISTORY: She  reports that she has never smoked. She has never used smokeless tobacco. She reports that she does not drink alcohol or use drugs.  REVIEW OF SYSTEMS:   Unable to obtain as patient is currently intubated and sedated  SUBJECTIVE:   VITAL SIGNS: BP (!) 115/59   Pulse (!) 113   Temp 97.7 F (36.5 C)   Resp 15   Ht 5' 1"  (1.549 m)   Wt 139 lb 15.9 oz (63.5 kg)   SpO2 93%   BMI 26.45 kg/m   HEMODYNAMICS:    VENTILATOR SETTINGS: FiO2 (%):  [50 %] 50 %  INTAKE / OUTPUT: No intake/output data recorded.  PHYSICAL EXAMINATION: General: Chronically ill-looking, in no distress Neuro: Withdraws to pain, PERRLA, positive corneal reflexes HEENT: Normocephalic and atraumatic, trachea midline, oral mucosa with moderate secretions Cardiovascular: Apical pulse tachycardic,  S1, S2, no murmur, regurg or gallop, +2 pulses, no edema Lungs:  Intubated, bilateral breath sounds, expiratory wheezes and diffuse crackles and Rales; greater in the lower lung fields Abdomen:  Obese, positive bowel sounds, palpation reveals no organomegaly Musculoskeletal:  No joint swelling, positive range of motion Skin:  Warm and dry, no rash  LABS:  BMET  Recent Labs Lab 03/20/17 0209  NA 141  K 3.1*  CL 110  CO2 23  BUN 12  CREATININE 0.64  GLUCOSE 218*    Electrolytes  Recent Labs Lab 03/20/17 0209  CALCIUM 8.3*    CBC  Recent Labs Lab 03/20/17 0209  WBC 5.2  HGB 13.4  HCT 40.3  PLT 207    Coag's No results for input(s): APTT, INR in the last 168 hours.  Sepsis Markers  Recent Labs Lab 03/20/17 0209  LATICACIDVEN 1.6    ABG  Recent Labs Lab 03/20/17 0213  PHART 7.30*  PCO2ART 56*  PO2ART 68*    Liver Enzymes  Recent  Labs Lab 03/20/17 0209  AST 33  ALT 17  ALKPHOS 119  BILITOT 1.0  ALBUMIN 2.7*    Cardiac Enzymes  Recent Labs Lab 03/20/17 0209  TROPONINI <0.03    Glucose  Recent Labs Lab 03/20/17 0353  GLUCAP 177*    Imaging Dg Abd 1 View  Result Date: 03/20/2017 CLINICAL DATA:  Assess orogastric tube placement. EXAM: ABDOMEN - 1 VIEW COMPARISON:  CT abdomen and pelvis February 22, 2017 FINDINGS: Nasogastric tube looped in mid stomach. Tip projects back in proximal to mid stomach. Paucity of bowel gas with moderate amount of retained large bowel stool. Surgical clips in the included right abdomen compatible with cholecystectomy. Soft tissue planes and included osseous structure nonsuspicious. Multiple EKG lines and pacer pads overlie the patient and may obscure subtle underlying pathology. IMPRESSION: Nasogastric tube tip projects in proximal to mid stomach. Electronically Signed   By: Elon Alas M.D.   On: 03/20/2017 04:48   Dg Chest Port 1 View  Result Date: 03/20/2017 CLINICAL DATA:  Endotracheal  tube placement.  Initial encounter. EXAM: PORTABLE CHEST 1 VIEW COMPARISON:  Chest radiograph performed earlier today at 2:18 a.m. FINDINGS: The patient's endotracheal tube is seen ending 2-3 cm above the carina. An enteric tube is noted extending below the diaphragm. There is mild elevation of the right hemidiaphragm. Vascular congestion is noted. Increased interstitial markings raise concern for pulmonary edema. Small bilateral pleural effusions are noted. No pneumothorax is seen. The cardiomediastinal silhouette is mildly enlarged. No acute osseous abnormalities are identified. External pacing pads are noted. IMPRESSION: 1. Endotracheal tube seen ending 2-3 cm above the carina. 2. Mild elevation of the right hemidiaphragm. Vascular congestion and mild cardiomegaly. Increased interstitial markings raise concern for pulmonary edema. Small bilateral pleural effusions seen. Electronically Signed    By: Garald Balding M.D.   On: 03/20/2017 03:08   Dg Chest Port 1 View  Result Date: 03/20/2017 CLINICAL DATA:  Patient found unresponsive. Rhonchi. Initial encounter. EXAM: PORTABLE CHEST 1 VIEW COMPARISON:  Chest radiograph performed 02/26/2017 FINDINGS: There is mild elevation of the right hemidiaphragm. Small bilateral pleural effusions are noted. Bibasilar airspace opacities may reflect atelectasis or pneumonia. Haziness within the left lung raises concern for mild superimposed interstitial edema. No pneumothorax is seen. The cardiomediastinal silhouette is mildly enlarged. No acute osseous abnormalities are seen. IMPRESSION: Mild elevation of the right hemidiaphragm. Small bilateral pleural effusions. Bibasilar airspace opacities may reflect atelectasis or pneumonia. Haziness within the left lung raises concern for mild superimposed interstitial edema. Mild cardiomegaly. Electronically Signed   By: Garald Balding M.D.   On: 03/20/2017 02:45    STUDIES:  None  CULTURES: Blood cultures 2. Urine culture  ANTIBIOTICS: Vancomycin. Cefepime  SIGNIFICANT EVENTS: 03/20/2017: Admitted with acute respiratory failure, intubated  LINES/TUBES: Peripheral IVs Foley catheter. ET tube Right IJ triple-lumen  DISCUSSION: This is a 71 year old female with multiple comorbidities presenting with acute hypoxemic respiratory failure, sepsis secondary to UTI, septic shock and pulmonary edema on chest x-ray  ASSESSMENT / PLAN:  PULMONARY A: Acute hypoxemic/hypercarbic respiratory failure Pulmonary edema P:   Full vent support with current settings. Chest x-ray and ABG as needed. Nebulized steroids and bronchodilators IV steroids Patient is currently in septic shock. Will hold off on diuresis Weaning trials as tolerated  CARDIOVASCULAR A:  Septic shock. History of coronary artery disease. History of hypertension History of hyperlipidemia P:  Hemodynamic monitoring per ICU  protocol Cycle cardiac enzymes Hold off on home blood pressure medications. Gentle fluid resuscitation Pressors to maintain mean arterial blood pressure greater than 65  RENAL A:   Hypokalemia P:   Monitor and replace electrolytes Monitor creatinine  GASTROINTESTINAL A:   History of GERD P:   GI prophylaxis with Protonix  HEMATOLOGIC A:   DVT prophylaxis P:  Heparin  INFECTIOUS A:   UTI Urosepsis P:   Follow-up cultures Antibiotics as above  ENDOCRINE A:   History of type 2 diabetes  P:   Blood glucose monitoring with sliding-scale insulin coverage  NEUROLOGIC A:   Acute encephalopathy-most likely metabolic secondary to sepsis P:   RASS goal: 0 to -1 Fentanyl and Versed for vent sedation Monitor neurological status   FAMILY  - Updates: No family at bedside. Updated when available - Inter-disciplinary family meet or Palliative Care meeting due by:  day [place]. [facility] ANP-BC Pulmonary and [place] Pager [phone] or [phone] 03/20/2017, 5:01 AM   STAFF NOTE: I. Dr. Ashby Dawes, have personally reviewed the patient's available data including medical history ,  imaging, events of notes, physican examination and test results as part of my evaluation. I have discussed with the with the NP and other care providers including  pharmacist, ICU RN, RRT, dietary.  Physical Exam Lungs - Decreased air entry bilaterally.   Patient presents with acute respiratory failure, BNP is elevated at greater than 1000, review of ABG shows acute on chronic hypercapnic respiratory failure. Chest x-ray shows pulmonary edema with possible pneumonia. Patient continues to be ventilator dependent. She was initially treated with fluid boluses for sepsis per protocol. Discussed with patient's daughter at bedside. Will wean down ventilator and pressor support as tolerated. Continue broad-spectrum antibiotics, start diuresis.  Marda Stalker, MD.   Board Certified in Internal Medicine, Pulmonary Medicine, [place], and Sleep Medicine.  [place] [facility] Office Number: [phone] Pager: [phone]  Patricia Pesa, M.D.  Merton Border, M.D   [place].  I have personally obtained a history, examined the patient, evaluated laboratory and imaging results, formulated the assessment and plan and placed orders. The Patient requires high complexity decision making for assessment and support, frequent evaluation and titration of therapies, application of advanced monitoring technologies and extensive interpretation of multiple databases. The patient has critical illness that could lead imminently to failure of 1 or more organ systems and requires the highest level of physician preparedness to intervene.  Critical Care Time devoted to patient care services described in this note is 45 minutes and is exclusive of time spent in procedures supervisory time of NP.

## 2017-03-20 NOTE — ED Notes (Signed)
ED Provider at bedside. 

## 2017-03-21 ENCOUNTER — Inpatient Hospital Stay (HOSPITAL_COMMUNITY)
Admit: 2017-03-21 | Discharge: 2017-03-21 | Disposition: A | Discharge status: Home / Self Care | Payer: Medicare Other | Attending: Adult Health | Admitting: Adult Health

## 2017-03-21 ENCOUNTER — Inpatient Hospital Stay: Payer: Medicare Other

## 2017-03-21 DIAGNOSIS — R011 Cardiac murmur, unspecified: Secondary | ICD-10-CM

## 2017-03-21 DIAGNOSIS — I361 Nonrheumatic tricuspid (valve) insufficiency: Secondary | ICD-10-CM

## 2017-03-21 DIAGNOSIS — J449 Chronic obstructive pulmonary disease, unspecified: Secondary | ICD-10-CM

## 2017-03-21 LAB — PHOSPHORUS: Phosphorus: 3.5 mg/dL (ref 2.5–4.6)

## 2017-03-21 LAB — COMPREHENSIVE METABOLIC PANEL
ALBUMIN: 2.5 g/dL — AB (ref 3.5–5.0)
ALT: 15 U/L (ref 14–54)
AST: 23 U/L (ref 15–41)
Alkaline Phosphatase: 87 U/L (ref 38–126)
Anion gap: 10 (ref 5–15)
BILIRUBIN TOTAL: 1.1 mg/dL (ref 0.3–1.2)
BUN: 14 mg/dL (ref 6–20)
CHLORIDE: 110 mmol/L (ref 101–111)
CO2: 24 mmol/L (ref 22–32)
CREATININE: 0.76 mg/dL (ref 0.44–1.00)
Calcium: 7.9 mg/dL — ABNORMAL LOW (ref 8.9–10.3)
GFR calc Af Amer: >60 mL/min (ref >60)
GFR calc non Af Amer: >60 mL/min (ref >60)
GLUCOSE: 127 mg/dL — AB (ref 65–99)
POTASSIUM: 4 mmol/L (ref 3.5–5.1)
Sodium: 144 mmol/L (ref 135–145)
TOTAL PROTEIN: 5.2 g/dL — AB (ref 6.5–8.1)

## 2017-03-21 LAB — GLUCOSE, CAPILLARY
GLUCOSE-CAPILLARY: 117 mg/dL — AB (ref 65–99)
GLUCOSE-CAPILLARY: 82 mg/dL (ref 65–99)
GLUCOSE-CAPILLARY: 103 mg/dL — AB (ref 65–99)
Glucose-Capillary: 108 mg/dL — ABNORMAL HIGH (ref 65–99)
Glucose-Capillary: 112 mg/dL — ABNORMAL HIGH (ref 65–99)
Glucose-Capillary: 113 mg/dL — ABNORMAL HIGH (ref 65–99)

## 2017-03-21 LAB — BLOOD GAS, ARTERIAL
Acid-Base Excess: 2.8 mmol/L — ABNORMAL HIGH (ref 0.0–2.0)
Bicarbonate: 27.2 mmol/L (ref 20.0–28.0)
FIO2: 0.3
Mode: POSITIVE
O2 Saturation: 96.5 %
PEEP: 5 cmH2O
Patient temperature: 37
Pressure support: 5 cmH2O
pCO2 arterial: 40 mmHg (ref 32.0–48.0)
pH, Arterial: 7.44 (ref 7.350–7.450)
pO2, Arterial: 83 mmHg (ref 83.0–108.0)

## 2017-03-21 LAB — CBC
HCT: 36.8 % (ref 35.0–47.0)
Hemoglobin: 12.4 g/dL (ref 12.0–16.0)
MCH: 30.7 pg (ref 26.0–34.0)
MCHC: 33.8 g/dL (ref 32.0–36.0)
MCV: 90.9 fL (ref 80.0–100.0)
Platelets: 246 10*3/uL (ref 150–440)
RBC: 4.04 MIL/uL (ref 3.80–5.20)
RDW: 17.9 % — ABNORMAL HIGH (ref 11.5–14.5)
WBC: 7.4 10*3/uL (ref 3.6–11.0)

## 2017-03-21 LAB — MAGNESIUM: Magnesium: 1.4 mg/dL — ABNORMAL LOW (ref 1.7–2.4)

## 2017-03-21 LAB — PROCALCITONIN: Procalcitonin: 0.25 ng/mL

## 2017-03-21 MED ORDER — MAGNESIUM SULFATE 4 GM/100ML IV SOLN
4.0000 g | Freq: Once | INTRAVENOUS | Status: AC
  Administered 2017-03-21: 4 g via INTRAVENOUS
  Filled 2017-03-21: qty 100

## 2017-03-21 MED ORDER — METOPROLOL TARTRATE 5 MG/5ML IV SOLN
5.0000 mg | INTRAVENOUS | Status: DC | PRN
  Administered 2017-03-21: 5 mg via INTRAVENOUS
  Filled 2017-03-21: qty 5

## 2017-03-21 MED ORDER — FENTANYL BOLUS VIA INFUSION
100.0000 ug | Freq: Once | INTRAVENOUS | Status: AC
  Administered 2017-03-21: 100 ug via INTRAVENOUS

## 2017-03-21 MED ORDER — ENSURE ENLIVE PO LIQD
237.0000 mL | Freq: Two times a day (BID) | ORAL | Status: DC
  Administered 2017-03-22 – 2017-03-24 (×4): 237 mL via ORAL

## 2017-03-21 MED ORDER — PERFLUTREN LIPID MICROSPHERE
1.0000 mL | INTRAVENOUS | Status: AC | PRN
  Administered 2017-03-21: 1.5 mL via INTRAVENOUS
  Filled 2017-03-21: qty 10

## 2017-03-21 MED ORDER — METOPROLOL TARTRATE 25 MG PO TABS
25.0000 mg | ORAL_TABLET | Freq: Two times a day (BID) | ORAL | Status: DC
  Administered 2017-03-21 (×2): 25 mg via ORAL
  Filled 2017-03-21 (×2): qty 1

## 2017-03-21 MED ORDER — FUROSEMIDE 10 MG/ML IJ SOLN
20.0000 mg | Freq: Three times a day (TID) | INTRAMUSCULAR | Status: DC
  Administered 2017-03-21 – 2017-03-22 (×4): 20 mg via INTRAVENOUS
  Filled 2017-03-21 (×4): qty 2

## 2017-03-21 NOTE — Progress Notes (Signed)
[facility] NAME: Meghan [name]    MR#:  [id number]  DATE OF BIRTH:  12/10/1945  SUBJECTIVE:  CHIEF COMPLAINT:   Chief Complaint  Patient presents with  . Loss of Consciousness  . Respiratory Distress   - Recent discharge from the hospital to rehabilitation, was brought in secondary to unresponsive episode and was noted to have acute pulmonary edema. - Remains intubated and sedated, sedation had to be increased last night due to agitation and trying to pull ET tube out -Also on low-dose pressors.  REVIEW OF SYSTEMS:  Review of Systems  Unable to perform ROS: Critical illness    DRUG ALLERGIES:   Allergies  Allergen Reactions  . Gabapentin Other (See Comments)    Pt states that it causes her BP to drop.   . Naproxen Hives  . Nsaids Other (See Comments)    Reaction:  Unknown   . Tramadol Itching    VITALS:  Blood pressure 112/65, pulse (!) 147, temperature 99.7 F (37.6 C), resp. rate 20, height 5\' 1"  (1.549 m), weight 63.5 kg (139 lb 15.9 oz), SpO2 94 %.  PHYSICAL EXAMINATION:  Physical Exam  GENERAL:  71 y.o.-year-old Elderly patient lying in bed, intubated on the ventilator EYES: Pupils equal, round, reactive to light and accommodation. No scleral icterus. Extraocular muscles intact.  HEENT: Head atraumatic, normocephalic. Oropharynx and nasopharynx clear. Orally intubated, has an OG tube NECK:  Supple, no jugular venous distention. No thyroid enlargement, no tenderness.  LUNGS: Normal breath sounds bilaterally, no wheezing, rales,rhonchi or crepitation. No use of accessory muscles of respiration. Decreased bibasilar breath sounds CARDIOVASCULAR: S1, S2 normal. No rubs, or gallops. 3/6 systolic murmur present ABDOMEN: Soft, nontender, nondistended. Bowel sounds present. No organomegaly or mass.  EXTREMITIES: No pedal edema, cyanosis, or clubbing.  NEUROLOGIC: No obvious facial droop noted. Patient is sedated and not  following any commands  -However opening eyes to her name, was following some commands last night PSYCHIATRIC: The patient is sedated.  SKIN: No obvious rash, lesion, or ulcer.    LABORATORY PANEL:   CBC  Recent Labs Lab 03/21/17 0429  WBC 7.4  HGB 12.4  HCT 36.8  PLT 246   ------------------------------------------------------------------------------------------------------------------  Chemistries   Recent Labs Lab 03/21/17 0429  NA 144  K 4.0  CL 110  CO2 24  GLUCOSE 127*  BUN 14  CREATININE 0.76  CALCIUM 7.9*  MG 1.4*  AST 23  ALT 15  ALKPHOS 87  BILITOT 1.1   ------------------------------------------------------------------------------------------------------------------  Cardiac Enzymes  Recent Labs Lab 03/20/17 1140  TROPONINI 0.05*   ------------------------------------------------------------------------------------------------------------------  RADIOLOGY:  Dg Abd 1 View  Result Date: 03/20/2017 CLINICAL DATA:  Assess orogastric tube placement. EXAM: ABDOMEN - 1 VIEW COMPARISON:  CT abdomen and pelvis February 22, 2017 FINDINGS: Nasogastric tube looped in mid stomach. Tip projects back in proximal to mid stomach. Paucity of bowel gas with moderate amount of retained large bowel stool. Surgical clips in the included right abdomen compatible with cholecystectomy. Soft tissue planes and included osseous structure nonsuspicious. Multiple EKG lines and pacer pads overlie the patient and may obscure subtle underlying pathology. IMPRESSION: Nasogastric tube tip projects in proximal to mid stomach. Electronically Signed   By: Elon Alas M.D.   On: 03/20/2017 04:48   Dg Chest Port 1 View  Result Date: 03/21/2017 CLINICAL DATA:  Acute respiratory failure EXAM: PORTABLE CHEST 1 VIEW COMPARISON:  03/20/17 FINDINGS: Endotracheal tube, nasogastric catheter and right  jugular central line are again seen and stable. Bibasilar infiltrative changes are again seen  right greater than left. The degree of central vascular congestion and edema has improved significantly in the interval from the prior exam. No new focal abnormality is noted. IMPRESSION: Resolving pulmonary edema and vascular congestion. Bibasilar infiltrative changes right greater than left. Electronically Signed   By: Inez Catalina M.D.   On: 03/21/2017 07:33   Dg Chest Port 1 View  Result Date: 03/20/2017 CLINICAL DATA:  Central line placement.  Initial encounter. EXAM: PORTABLE CHEST 1 VIEW COMPARISON:  Chest radiograph performed earlier today at 2:56 a.m. FINDINGS: The patient's endotracheal tube is seen ending 1-2 cm above the carina. This could be retracted 1-2 cm. The right IJ line is noted ending about the proximal right atrium. This could be retracted 3 cm, as deemed clinically appropriate. The enteric tube is noted extending below the diaphragm. Vascular congestion is again noted, with worsening increased interstitial markings, concerning for worsening pulmonary edema. Small bilateral pleural effusions are noted. No pneumothorax is seen. The cardiomediastinal silhouette is mildly enlarged. No acute osseous abnormalities are identified. An external pacing pad is noted. IMPRESSION: 1. Endotracheal tube seen ending 1-2 cm above the carina. This could be retracted 1-2 cm. 2. Right IJ line noted ending about the proximal right atrium. This could be retracted 3 cm, as deemed clinically appropriate. 3. Vascular congestion and mild cardiomegaly. Worsening increased interstitial markings are concerning for worsening pulmonary edema. Small bilateral pleural effusions noted. Electronically Signed   By: Garald Balding M.D.   On: 03/20/2017 06:42   Dg Chest Port 1 View  Result Date: 03/20/2017 CLINICAL DATA:  Endotracheal tube placement.  Initial encounter. EXAM: PORTABLE CHEST 1 VIEW COMPARISON:  Chest radiograph performed earlier today at 2:18 a.m. FINDINGS: The patient's endotracheal tube is seen ending 2-3  cm above the carina. An enteric tube is noted extending below the diaphragm. There is mild elevation of the right hemidiaphragm. Vascular congestion is noted. Increased interstitial markings raise concern for pulmonary edema. Small bilateral pleural effusions are noted. No pneumothorax is seen. The cardiomediastinal silhouette is mildly enlarged. No acute osseous abnormalities are identified. External pacing pads are noted. IMPRESSION: 1. Endotracheal tube seen ending 2-3 cm above the carina. 2. Mild elevation of the right hemidiaphragm. Vascular congestion and mild cardiomegaly. Increased interstitial markings raise concern for pulmonary edema. Small bilateral pleural effusions seen. Electronically Signed   By: Garald Balding M.D.   On: 03/20/2017 03:08   Dg Chest Port 1 View  Result Date: 03/20/2017 CLINICAL DATA:  Patient found unresponsive. Rhonchi. Initial encounter. EXAM: PORTABLE CHEST 1 VIEW COMPARISON:  Chest radiograph performed 02/26/2017 FINDINGS: There is mild elevation of the right hemidiaphragm. Small bilateral pleural effusions are noted. Bibasilar airspace opacities may reflect atelectasis or pneumonia. Haziness within the left lung raises concern for mild superimposed interstitial edema. No pneumothorax is seen. The cardiomediastinal silhouette is mildly enlarged. No acute osseous abnormalities are seen. IMPRESSION: Mild elevation of the right hemidiaphragm. Small bilateral pleural effusions. Bibasilar airspace opacities may reflect atelectasis or pneumonia. Haziness within the left lung raises concern for mild superimposed interstitial edema. Mild cardiomegaly. Electronically Signed   By: Garald Balding M.D.   On: 03/20/2017 02:45    EKG:   Orders placed or performed during the hospital encounter of 03/20/17  . ED EKG  . ED EKG  . EKG 12-Lead  . EKG 12-Lead    ASSESSMENT AND PLAN:   71 year old female with past  medical history significant for CAD, arthritis, anxiety, diastolic  CHF, COPD, diabetes, hypertension, recent admission for C. difficile colitis and bronchitis last week presents from rehabilitation secondary to altered mental status.  #1 altered mental status-likely metabolic encephalopathy -Secondary to sepsis. Currently on fentanyl for sedation -CT of the head not done on this admission. Ct head done about 2 weeks ago showing stable atrophy and chronic small vessel ischemic disease and history of right suboccipital craniectomy with underlying right cerebellar encephalomalacia. -No seizure-like activity described  #2 acute hypoxic respiratory failure-secondary to pulmonary edema -Likely diastolic dysfunction. -Patient intubated, on minimal vent settings at this time. -Management per pulmonary -On Lasix, consider repeat ECHO  #3 sepsis-on low-dose Levophed. -Likely source urinary tract infection. Follow up cultures -On vancomycin and cefepime at this time. Narrow antibiotics once culture results are available. -Monitor carefully as recent admission for C. difficile colitis 2 weeks ago.  #4 bipolar disorder and posttraumatic stress disorder-continue outpatient medications. Patient on Lamictal, topamax and Lexapro  #5 hypokalemia-replaced  #6 DVT prophylaxis-on subcutaneous heparin-can be changed to Lovenox as renal function is normal     All the records are reviewed and case discussed with Care Management/Social Workerr. Management plans discussed with the patient, family and they are in agreement.  CODE STATUS: Full code  TOTAL TIME TAKING CARE OF THIS PATIENT: 36 minutes.   POSSIBLE D/C IN 2-3 DAYS, DEPENDING ON CLINICAL CONDITION.   Gladstone Lighter M.D on 03/21/2017 at 10:25 AM  Between 7am to 6pm - Pager - [phone]  After 6pm go to [email or web address] - password [email or web address]  Office  [phone]  CC: Primary care physician; Lavera Guise, MD

## 2017-03-21 NOTE — Progress Notes (Signed)
PULMONARY / CRITICAL CARE MEDICINE   Name: Meghan [name] MRN: [id number] DOB: July 23, 1946    ADMISSION DATE:  03/20/2017  REASON: Acute respiratory failure  SUBJECTIVE:  No acute issues over night. Remains on full vent support. On minimal dose of levophed; off dopamine  VITAL SIGNS: BP (!) 111/58   Pulse (!) 101   Temp 99.5 F (37.5 C)   Resp 18   Ht 5\' 1"  (1.549 m)   Wt 139 lb 15.9 oz (63.5 kg)   SpO2 96%   BMI 26.45 kg/m   HEMODYNAMICS: CVP:  [0 mmHg-12 mmHg] 2 mmHg  VENTILATOR SETTINGS: Vent Mode: PRVC FiO2 (%):  [30 %-50 %] 30 % Set Rate:  [18 bmp] 18 bmp Vt Set:  [450 mL] 450 mL PEEP:  [5 cmH20] 5 cmH20 Plateau Pressure:  [13 cmH20-18 cmH20] 17 cmH20  INTAKE / OUTPUT: I/O last 3 completed shifts: In: 4422.8 [I.V.:487.8; Other:95; NG/GT:90; IV Piggyback:3750] Out: 1660 [Urine:1660]  PHYSICAL EXAMINATION: General: Chronically ill-looking, in no distress Neuro: Withdraws and opens eyes to pain, PERRLA, positive corneal reflexes HEENT: Normocephalic and atraumatic, trachea midline, oral mucosa with moderate secretions Cardiovascular: Apical pulse tachycardic,  S1, S2, no murmur, regurg or gallop, +2 pulses, no edema Lungs:  Intubated, bilateral breath sounds, expiratory wheezes and improved crackles and Rales; greater in the lower lung fields Abdomen:  Obese, positive bowel sounds, palpation reveals no organomegaly Musculoskeletal:  No joint swelling, positive range of motion Skin:  Warm and dry, no rash  LABS:  BMET  Recent Labs Lab 03/20/17 0209 03/20/17 0800 03/21/17 0429  NA 141 141 144  K 3.1* 3.1* 4.0  CL 110 110 110  CO2 23 24 24   BUN 12 11 14   CREATININE 0.64 0.39* 0.76  GLUCOSE 218* 173* 127*    Electrolytes  Recent Labs Lab 03/20/17 0209 03/20/17 0800 03/21/17 0429  CALCIUM 8.3* 7.7* 7.9*  MG 1.6*  --  1.4*  PHOS 4.7*  --  3.5    CBC  Recent Labs Lab 03/20/17 0209 03/20/17 0800 03/21/17 0429  WBC 5.2 9.5 7.4  HGB 13.4  13.2 12.4  HCT 40.3 39.1 36.8  PLT 207 279 246    Coag's No results for input(s): APTT, INR in the last 168 hours.  Sepsis Markers  Recent Labs Lab 03/20/17 0209 03/20/17 0758 03/20/17 0800 03/20/17 1140 03/21/17 0429  LATICACIDVEN 1.6 1.7  --  2.4*  --   PROCALCITON  --   --  0.14  --  0.25    ABG  Recent Labs Lab 03/20/17 0213 03/20/17 0600  PHART 7.30* 7.28*  PCO2ART 56* 53*  PO2ART 68* 66*    Liver Enzymes  Recent Labs Lab 03/20/17 0209 03/21/17 0429  AST 33 23  ALT 17 15  ALKPHOS 119 87  BILITOT 1.0 1.1  ALBUMIN 2.7* 2.5*    Cardiac Enzymes  Recent Labs Lab 03/20/17 0209 03/20/17 1140  TROPONINI <0.03 0.05*    Glucose  Recent Labs Lab 03/20/17 0812 03/20/17 1133 03/20/17 1546 03/20/17 2009 03/21/17 0008 03/21/17 0416  GLUCAP 178* 167* 198* 158* 113* 108*    Imaging No results found.  STUDIES:  2 D echo pending  CULTURES: Blood cultures 2. Urine culture  ANTIBIOTICS: Vancomycin. Cefepime  SIGNIFICANT EVENTS: 03/20/2017: Admitted with acute respiratory failure, intubated  LINES/TUBES: Peripheral IVs Foley catheter. ET tube Right IJ triple-lumen  DISCUSSION: This is a 71 year old female with multiple comorbidities presenting with acute hypoxemic respiratory failure, sepsis secondary to UTI, septic shock  and pulmonary edema on chest x-ray  ASSESSMENT / PLAN:  PULMONARY A: Acute hypoxemic/hypercarbic respiratory failure Pulmonary edema P:   Full vent support with current settings. Chest x-ray and ABG as needed. Nebulized steroids and bronchodilators IV steroids IV Diuresis Weaning trials as tolerated  CARDIOVASCULAR A:  Septic shock-improved History of coronary artery disease. History of hypertension History of hyperlipidemia P:  Hemodynamic monitoring per ICU protocol 2-D echo Hold off on home blood pressure medications. Pressors to maintain mean arterial blood pressure greater than  65  RENAL A:   Hypokalemia P:   Monitor and replace electrolytes Monitor creatinine  GASTROINTESTINAL A:   History of GERD P:   GI prophylaxis with Protonix  HEMATOLOGIC A:   DVT prophylaxis P:  Heparin  INFECTIOUS A:   UTI Urosepsis P:   Follow-up cultures Antibiotics as above  ENDOCRINE A:   History of type 2 diabetes  P:   Blood glucose monitoring with sliding-scale insulin coverage  NEUROLOGIC A:   Acute encephalopathy-most likely metabolic secondary to sepsis P:   RASS goal: 0 to -1 Fentanyl and Versed for vent sedation Monitor neurological status   FAMILY  - Updates: No family at bedside. Updated when available - Inter-disciplinary family meet or Palliative Care meeting due by:  day [place]. [facility] ANP-BC Pulmonary and [place] Pager [phone] or [phone] 03/21/2017, 7:01 AM   STAFF NOTE: I. Dr. Ashby Dawes, have personally reviewed the patient's available data including medical history , events of notes, physican examination and test results as part of my evaluation. I have discussed with the  Care with the NP and other care providers including  pharmacist, ICU RN, RRT, dietary.  Physical Exam Lungs - Decreased air entry bilaterally.   CXR images personally reviewed, continue bibasilar atelectasis/effusions with continued pulm edema. Continue empiric abx.  Will continue lasix for further diuresis. Will perform SBT and weaning trial today, will consider extubation if tolerated.   Marda Stalker, MD.   Board Certified in Internal Medicine, Pulmonary Medicine, [place], and Sleep Medicine.  [facility] Office Number: [phone] Pager: [phone]  Patricia Pesa, M.D.  Merton Border, M.D   [place].  I have personally obtained a history, examined the patient, evaluated laboratory and imaging results, formulated the assessment and  plan and placed orders. The Patient requires high complexity decision making for assessment and support, frequent evaluation and titration of therapies, application of advanced monitoring technologies and extensive interpretation of multiple databases. The patient has critical illness that could lead imminently to failure of 1 or more organ systems and requires the highest level of physician preparedness to intervene.  Critical Care Time devoted to patient care services described in this note is 35 minutes and is exclusive of time spent in procedures supervisory time of NP.

## 2017-03-21 NOTE — Progress Notes (Addendum)
MEDICATION RELATED CONSULT NOTE - INITIAL   Pharmacy Consult for electrolyte replacement Indication:   Allergies  Allergen Reactions  . Gabapentin Other (See Comments)    Pt states that it causes her BP to drop.   . Naproxen Hives  . Nsaids Other (See Comments)    Reaction:  Unknown   . Tramadol Itching    Patient Measurements: Height: 5\' 1"  (154.9 cm) Weight: 139 lb 15.9 oz (63.5 kg) IBW/kg (Calculated) : 47.8 Adjusted Body Weight: n/a  Vital Signs: Temp: 99.9 F (37.7 C) (08/12 0015) Temp Source: Core (Comment) (08/12 0015) BP: 106/61 (08/12 0015) Pulse Rate: 109 (08/12 0015) Intake/Output from previous day: 08/11 0701 - 08/12 0700 In: 766.2 [I.V.:311.2; NG/GT:60; IV Piggyback:300] Out: 1085 [Urine:1085] Intake/Output from this shift: Total I/O In: 214.2 [I.V.:114.2; IV Piggyback:100] Out: 260 [Urine:260]  Labs:  Recent Labs  03/20/17 0209 03/20/17 0800  WBC 5.2 9.5  HGB 13.4 13.2  HCT 40.3 39.1  PLT 207 279  CREATININE 0.64 0.39*  MG 1.6*  --   PHOS 4.7*  --   ALBUMIN 2.7*  --   PROT 5.8*  --   AST 33  --   ALT 17  --   ALKPHOS 119  --   BILITOT 1.0  --    Estimated Creatinine Clearance: 55.1 mL/min (A) (by C-G formula based on SCr of 0.39 mg/dL (L)).   Microbiology: Recent Results (from the past 720 hour(s))  Gastrointestinal Panel by PCR , Stool     Status: None   Collection Time: 02/22/17  1:13 PM  Result Value Ref Range Status   Campylobacter species NOT DETECTED NOT DETECTED Final   Plesimonas shigelloides NOT DETECTED NOT DETECTED Final   Salmonella species NOT DETECTED NOT DETECTED Final   Yersinia enterocolitica NOT DETECTED NOT DETECTED Final   Vibrio species NOT DETECTED NOT DETECTED Final   Vibrio cholerae NOT DETECTED NOT DETECTED Final   Enteroaggregative E coli (EAEC) NOT DETECTED NOT DETECTED Final   Enteropathogenic E coli (EPEC) NOT DETECTED NOT DETECTED Final   Enterotoxigenic E coli (ETEC) NOT DETECTED NOT DETECTED Final   Shiga like toxin producing E coli (STEC) NOT DETECTED NOT DETECTED Final   Shigella/Enteroinvasive E coli (EIEC) NOT DETECTED NOT DETECTED Final   Cryptosporidium NOT DETECTED NOT DETECTED Final   Cyclospora cayetanensis NOT DETECTED NOT DETECTED Final   Entamoeba histolytica NOT DETECTED NOT DETECTED Final   Giardia lamblia NOT DETECTED NOT DETECTED Final   Adenovirus F40/41 NOT DETECTED NOT DETECTED Final   Astrovirus NOT DETECTED NOT DETECTED Final   Norovirus GI/GII NOT DETECTED NOT DETECTED Final   Rotavirus A NOT DETECTED NOT DETECTED Final   Sapovirus (I, II, IV, and V) NOT DETECTED NOT DETECTED Final  Blood Culture (routine x 2)     Status: None   Collection Time: 02/22/17  1:13 PM  Result Value Ref Range Status   Specimen Description BLOOD LEFT ANTECUBITAL  Final   Special Requests   Final    BOTTLES DRAWN AEROBIC AND ANAEROBIC Blood Culture adequate volume   Culture NO GROWTH 5 DAYS  Final   Report Status 02/27/2017 FINAL  Final  C difficile quick scan w PCR reflex     Status: Abnormal   Collection Time: 02/22/17  1:13 PM  Result Value Ref Range Status   C Diff antigen POSITIVE (A) NEGATIVE Final   C Diff toxin POSITIVE (A) NEGATIVE Final   C Diff interpretation Toxin producing C. difficile detected.  Final  Comment: CRITICAL RESULT CALLED TO, READ BACK BY AND VERIFIED WITH: BRANDY FARRELL AT 0973 ON 02/22/2017 JJB   Blood Culture (routine x 2)     Status: None   Collection Time: 02/22/17  1:41 PM  Result Value Ref Range Status   Specimen Description BLOOD BLOOD RIGHT FOREARM  Final   Special Requests   Final    BOTTLES DRAWN AEROBIC AND ANAEROBIC Blood Culture adequate volume   Culture NO GROWTH 5 DAYS  Final   Report Status 02/27/2017 FINAL  Final  Blood Culture (routine x 2)     Status: None (Preliminary result)   Collection Time: 03/20/17  2:09 AM  Result Value Ref Range Status   Specimen Description BLOOD BLOOD RIGHT FOREARM  Final   Special Requests   Final     BOTTLES DRAWN AEROBIC AND ANAEROBIC Blood Culture results may not be optimal due to an excessive volume of blood received in culture bottles   Culture NO GROWTH < 12 HOURS  Final   Report Status PENDING  Incomplete  Blood Culture (routine x 2)     Status: None (Preliminary result)   Collection Time: 03/20/17  2:09 AM  Result Value Ref Range Status   Specimen Description BLOOD RIGHT ANTECUBITAL  Final   Special Requests   Final    BOTTLES DRAWN AEROBIC AND ANAEROBIC Blood Culture adequate volume   Culture NO GROWTH < 12 HOURS  Final   Report Status PENDING  Incomplete  MRSA PCR Screening     Status: Abnormal   Collection Time: 03/20/17  4:01 AM  Result Value Ref Range Status   MRSA by PCR POSITIVE (A) NEGATIVE Final    Comment:        The GeneXpert MRSA Assay (FDA approved for NASAL specimens only), is one component of a comprehensive MRSA colonization surveillance program. It is not intended to diagnose MRSA infection nor to guide or monitor treatment for MRSA infections. RESULT CALLED TO, READ BACK BY AND VERIFIED WITH: TONI BROWN AT [place] BY [facility]. [facility]   Culture, respiratory (NON-Expectorated)     Status: None (Preliminary result)   Collection Time: 03/20/17  3:31 PM  Result Value Ref Range Status   Specimen Description TRACHEAL ASPIRATE  Final   Special Requests NONE  Final   Gram Stain   Final    RARE WBC PRESENT,BOTH PMN AND MONONUCLEAR RARE GRAM POSITIVE COCCI Performed at [facility], [place] [address], [place], [place] [address]    Culture PENDING  Incomplete   Report Status PENDING  Incomplete    Medical History: Past Medical History:  Diagnosis Date  . Anginal pain ([place])   . Anxiety   . Arthritis    RA  . Asthma   . Brain tumor (benign) ([place])   . CHF (congestive heart failure) ([place])   . Collagen vascular disease ([place])   . COPD (chronic obstructive pulmonary disease) ([place])   . Coronary artery disease   . DDD (degenerative disc disease)   . Depression    . Diabetes mellitus   . GERD (gastroesophageal reflux disease)   . Headache   . Heart murmur   . Heart murmur   . Hypercholesteremia   . Hypertension   . Lumbar degenerative disc disease   . Migraines   . Obese   . Obesity   . Osteopenia   . Osteoporosis   . Pneumonia   . PTSD (post-traumatic stress disorder)     Medications:    Assessment:  K+ 3.1, corrected calcium 8.7  Goal of Therapy:  Electrolytes WNL  Plan:  40 mEq KCl IV ordered by CCM. F/u electrolytes in AM.  8/12 AM Mg 1.4. 4 grams magnesium sulfate IV ordered by CCM. Recheck BMP and magnesium with tomorrow AM labs.  Dejuan Elman S 03/21/2017,12:44 AM

## 2017-03-21 NOTE — Progress Notes (Signed)
WUA attempted at beginning of shift. Sedation turned down to 25 mcg of Fentanyl. Patient became tachypnec, tachycardic, and begin gagging on tube. Efforts to calm patient verbally failed. Sedation turned back to original rate of 50 mcg. Throughout night patient has required more sedation due to agitation and attempts to pull on ET tube. Patient follows commands and opens eyes to speech.

## 2017-03-21 NOTE — Progress Notes (Signed)
*  PRELIMINARY RESULTS* Echocardiogram 2D Echocardiogram has been performed. Definity Contrast was used on this exam.  Meghan [name] Meghan [name] 03/21/2017, 1:35 PM

## 2017-03-21 NOTE — Progress Notes (Addendum)
Initial Nutrition Assessment  DOCUMENTATION CODES:   Not applicable  INTERVENTION:   Ensure Enlive po BID, each supplement provides 350 kcal and 20 grams of protein  NUTRITION DIAGNOSIS:   Inadequate oral intake related to inability to eat (pt sedated on vent ) as evidenced by NPO status.  -pt extubated today but remains NPO  GOAL:   Patient will meet greater than or equal to 90% of their needs  MONITOR:   Diet advancement, Labs, Weight trends, I & O's  ASSESSMENT:   71 year old female with multiple comorbidities and h/o DM, COPD, CHF presenting with acute hypoxemic respiratory failure, sepsis secondary to UTI, septic shock, HCAP and pulmonary edema    RD identified pt for new vent. Pt extubated today; tolerating well. Spoke to RN; plan to initiate diet today. Pt drinking water with no issues. RD will order Ensure. RD will get nutrition related history at follow up. Per chart, pt appears to have lost 4lbs(3%) in two weeks; unsure if any true weight loss as pt with moderate to severe edema.    Medications reviewed and include: lasix, heparin, insulin, protonix, fentanyl, levophed, vancomycin, cefepime   Labs reviewed: Ca 7.9(L) adj. 9.1 wnl, alb 2.5(L), lactic acid 2.4(H) cbgs- 218, 173, 127 x 24 hrs  AIC 5.6- 7/18  Nutrition-Focused physical exam completed. Findings are no fat depletion, moderate muscle depletion in temporal regions, and moderate edema in extremities.   Diet Order:  Diet NPO time specified  Skin:  Reviewed, no issues  Last BM:  pta  Height:   Ht Readings from Last 1 Encounters:  03/20/17 5\' 1"  (1.549 m)    Weight:   Wt Readings from Last 1 Encounters:  03/20/17 139 lb 15.9 oz (63.5 kg)    Ideal Body Weight:  47.7 kg  BMI:  Body mass index is 26.45 kg/m.  Estimated Nutritional Needs:   Kcal:  1300-1400kcal/day   Protein:  82-95g/day   Fluid:  >1.3L/day   EDUCATION NEEDS:   Education needs no appropriate at this time  Koleen Distance MS, RD, [place] Pager #[phone] After Hours Pager: [phone]

## 2017-03-21 NOTE — Progress Notes (Signed)
Pt extubated to 2l [place] per Md order. Sats 93-94% at this time.

## 2017-03-22 ENCOUNTER — Inpatient Hospital Stay: Payer: Medicare Other

## 2017-03-22 DIAGNOSIS — L8990 Pressure ulcer of unspecified site, unspecified stage: Secondary | ICD-10-CM | POA: Insufficient documentation

## 2017-03-22 DIAGNOSIS — J9601 Acute respiratory failure with hypoxia: Secondary | ICD-10-CM

## 2017-03-22 LAB — ECHOCARDIOGRAM COMPLETE
HEIGHTINCHES: 61 in
Weight: 2239.87 oz

## 2017-03-22 LAB — BASIC METABOLIC PANEL
Anion gap: 9 (ref 5–15)
Anion gap: 8 (ref 5–15)
BUN: 12 mg/dL (ref 6–20)
BUN: 10 mg/dL (ref 6–20)
CALCIUM: 7.7 mg/dL — AB (ref 8.9–10.3)
CHLORIDE: 97 mmol/L — AB (ref 101–111)
CO2: 30 mmol/L (ref 22–32)
CO2: 29 mmol/L (ref 22–32)
CREATININE: 0.57 mg/dL (ref 0.44–1.00)
Calcium: 7.6 mg/dL — ABNORMAL LOW (ref 8.9–10.3)
Chloride: 102 mmol/L (ref 101–111)
Creatinine, Ser: 0.59 mg/dL (ref 0.44–1.00)
GFR calc Af Amer: >60 mL/min (ref >60)
GFR calc non Af Amer: >60 mL/min (ref >60)
GFR calc non Af Amer: >60 mL/min (ref >60)
GLUCOSE: 148 mg/dL — AB (ref 65–99)
Glucose, Bld: 100 mg/dL — ABNORMAL HIGH (ref 65–99)
POTASSIUM: 5.3 mmol/L — AB (ref 3.5–5.1)
Potassium: 2.6 mmol/L — CL (ref 3.5–5.1)
Sodium: 141 mmol/L (ref 135–145)
Sodium: 134 mmol/L — ABNORMAL LOW (ref 135–145)

## 2017-03-22 LAB — CBC
HCT: 34.3 % — ABNORMAL LOW (ref 35.0–47.0)
Hemoglobin: 11.7 g/dL — ABNORMAL LOW (ref 12.0–16.0)
MCH: 30.3 pg (ref 26.0–34.0)
MCHC: 34 g/dL (ref 32.0–36.0)
MCV: 89 fL (ref 80.0–100.0)
PLATELETS: 184 10*3/uL (ref 150–440)
RBC: 3.85 MIL/uL (ref 3.80–5.20)
RDW: 17.1 % — AB (ref 11.5–14.5)
WBC: 4.9 10*3/uL (ref 3.6–11.0)

## 2017-03-22 LAB — GLUCOSE, CAPILLARY
GLUCOSE-CAPILLARY: 86 mg/dL (ref 65–99)
GLUCOSE-CAPILLARY: 88 mg/dL (ref 65–99)
Glucose-Capillary: 100 mg/dL — ABNORMAL HIGH (ref 65–99)
Glucose-Capillary: 119 mg/dL — ABNORMAL HIGH (ref 65–99)
Glucose-Capillary: 86 mg/dL (ref 65–99)
Glucose-Capillary: 91 mg/dL (ref 65–99)
Glucose-Capillary: 93 mg/dL (ref 65–99)

## 2017-03-22 LAB — C DIFFICILE QUICK SCREEN W PCR REFLEX
C DIFFICILE (CDIFF) TOXIN: NEGATIVE
C Diff antigen: NEGATIVE
C Diff interpretation: NOT DETECTED

## 2017-03-22 LAB — VANCOMYCIN, TROUGH: Vancomycin Tr: 15 ug/mL (ref 15–20)

## 2017-03-22 LAB — MAGNESIUM: Magnesium: 2 mg/dL (ref 1.7–2.4)

## 2017-03-22 LAB — PROCALCITONIN: Procalcitonin: 0.17 ng/mL

## 2017-03-22 MED ORDER — POTASSIUM CHLORIDE 10 MEQ/100ML IV SOLN
10.0000 meq | INTRAVENOUS | Status: AC
  Administered 2017-03-22 (×4): 10 meq via INTRAVENOUS
  Filled 2017-03-22 (×4): qty 100

## 2017-03-22 MED ORDER — PREGABALIN 50 MG PO CAPS
50.0000 mg | ORAL_CAPSULE | Freq: Two times a day (BID) | ORAL | Status: DC
  Administered 2017-03-22 – 2017-03-24 (×5): 50 mg via ORAL
  Filled 2017-03-22: qty 2
  Filled 2017-03-22 (×4): qty 1

## 2017-03-22 MED ORDER — RISAQUAD PO CAPS
2.0000 | ORAL_CAPSULE | Freq: Three times a day (TID) | ORAL | Status: DC
  Administered 2017-03-22 – 2017-03-24 (×5): 2 via ORAL
  Filled 2017-03-22 (×8): qty 2

## 2017-03-22 MED ORDER — SODIUM CHLORIDE 0.9 % IV BOLUS (SEPSIS): 1000.0000 mL | Freq: Once | INTRAVENOUS | Status: DC

## 2017-03-22 MED ORDER — BACID PO TABS
2.0000 | ORAL_TABLET | Freq: Three times a day (TID) | ORAL | Status: DC
  Administered 2017-03-22: 2 via ORAL
  Filled 2017-03-22 (×3): qty 2

## 2017-03-22 MED ORDER — FUROSEMIDE 40 MG PO TABS
40.0000 mg | ORAL_TABLET | Freq: Two times a day (BID) | ORAL | Status: DC
  Administered 2017-03-22 – 2017-03-23 (×2): 40 mg via ORAL
  Filled 2017-03-22 (×2): qty 1

## 2017-03-22 MED ORDER — FAMOTIDINE 20 MG PO TABS
20.0000 mg | ORAL_TABLET | Freq: Every day | ORAL | Status: DC
  Administered 2017-03-22 – 2017-03-23 (×2): 20 mg via ORAL
  Filled 2017-03-22 (×2): qty 1

## 2017-03-22 MED ORDER — POTASSIUM CHLORIDE 20 MEQ PO PACK
20.0000 meq | PACK | Freq: Two times a day (BID) | ORAL | Status: AC
  Administered 2017-03-22 (×2): 20 meq via ORAL
  Filled 2017-03-22 (×2): qty 1

## 2017-03-22 MED ORDER — CHLORHEXIDINE GLUCONATE CLOTH 2 % EX PADS
6.0000 | MEDICATED_PAD | Freq: Every day | CUTANEOUS | Status: DC
  Administered 2017-03-22: 6 via TOPICAL

## 2017-03-22 MED ORDER — INSULIN ASPART 100 UNIT/ML ~~LOC~~ SOLN
0.0000 [IU] | Freq: Three times a day (TID) | SUBCUTANEOUS | Status: DC
  Administered 2017-03-23 (×2): 1 [IU] via SUBCUTANEOUS
  Filled 2017-03-22: qty 1

## 2017-03-22 MED ORDER — MUPIROCIN 2 % EX OINT
1.0000 "application " | TOPICAL_OINTMENT | Freq: Two times a day (BID) | CUTANEOUS | Status: DC
  Administered 2017-03-22 – 2017-03-24 (×5): 1 via NASAL
  Filled 2017-03-22: qty 22

## 2017-03-22 MED ORDER — ENOXAPARIN SODIUM 40 MG/0.4ML ~~LOC~~ SOLN
40.0000 mg | SUBCUTANEOUS | Status: DC
  Administered 2017-03-22 – 2017-03-23 (×2): 40 mg via SUBCUTANEOUS
  Filled 2017-03-22 (×2): qty 0.4

## 2017-03-22 NOTE — Progress Notes (Signed)
PULMONARY / CRITICAL CARE MEDICINE   Name: Meghan [name] MRN: [id number] DOB: [birth year]    ADMISSION DATE:  03/20/2017  REASON: Acute respiratory failure  SUBJECTIVE:  No acute issues over night. Doing well post-extubation. Titrating down levophed  VITAL SIGNS: BP 117/75   Pulse (!) 104   Temp 99.7 F (37.6 C) (Core (Comment))   Resp 20   Ht 5\' 1"  (1.549 m)   Wt 139 lb 15.9 oz (63.5 kg)   SpO2 98%   BMI 26.45 kg/m   HEMODYNAMICS: CVP:  [0 mmHg-15 mmHg] 9 mmHg  VENTILATOR SETTINGS: Vent Mode: PSV FiO2 (%):  [28 %-30 %] 28 % Set Rate:  [12 bmp] 12 bmp Vt Set:  [450 mL] 450 mL PEEP:  [5 cmH20] 5 cmH20 Pressure Support:  [5 cmH20] 5 cmH20 Plateau Pressure:  [12 cmH20] 12 cmH20  INTAKE / OUTPUT: I/O last 3 completed shifts: In: 857.7 [P.O.:240; I.V.:437.7; NG/GT:30; IV Piggyback:150] Out: 4585 [Urine:4585]  PHYSICAL EXAMINATION: General: Chronically ill-looking, in no distress Neuro: Alert to person only, awake, following commands HEENT: Normocephalic and atraumatic, trachea midline, oral mucosa with moderate secretions Cardiovascular: RRR,  S1, S2, no murmur, regurg or gallop, +2 pulses, no edema Lungs: Normal work of breathing, bilateral breath sounds,  improved crackles, no wheezing Abdomen:  Obese, positive bowel sounds, palpation reveals no organomegaly Musculoskeletal:  No joint swelling, positive range of motion Skin:  Warm and dry, no rash  LABS:  BMET  Recent Labs Lab 03/20/17 0800 03/21/17 0429 03/22/17 0402  NA 141 144 141  K 3.1* 4.0 2.6*  CL 110 110 102  CO2 24 24 30   BUN 11 14 12   CREATININE 0.39* 0.76 0.57  GLUCOSE 173* 127* 100*    Electrolytes  Recent Labs Lab 03/20/17 0209 03/20/17 0800 03/21/17 0429 03/22/17 0402  CALCIUM 8.3* 7.7* 7.9* 7.7*  MG 1.6*  --  1.4* 2.0  PHOS 4.7*  --  3.5  --     CBC  Recent Labs Lab 03/20/17 0800 03/21/17 0429 03/22/17 0402  WBC 9.5 7.4 4.9  HGB 13.2 12.4 11.7*  HCT 39.1 36.8 34.3*   PLT 279 246 184    Coag's No results for input(s): APTT, INR in the last 168 hours.  Sepsis Markers  Recent Labs Lab 03/20/17 0209 03/20/17 0758 03/20/17 0800 03/20/17 1140 03/21/17 0429 03/22/17 0402  LATICACIDVEN 1.6 1.7  --  2.4*  --   --   PROCALCITON  --   --  0.14  --  0.25 0.17    ABG  Recent Labs Lab 03/20/17 0213 03/20/17 0600 03/21/17 0955  PHART 7.30* 7.28* 7.44  PCO2ART 56* 53* 40  PO2ART 68* 66* 83    Liver Enzymes  Recent Labs Lab 03/20/17 0209 03/21/17 0429  AST 33 23  ALT 17 15  ALKPHOS 119 87  BILITOT 1.0 1.1  ALBUMIN 2.7* 2.5*    Cardiac Enzymes  Recent Labs Lab 03/20/17 0209 03/20/17 1140  TROPONINI <0.03 0.05*    Glucose  Recent Labs Lab 03/21/17 0730 03/21/17 1121 03/21/17 1549 03/21/17 2019 03/22/17 0039 03/22/17 0403  GLUCAP 117* 112* 82 103* 91 86    Imaging No results found.  STUDIES:  2 D echo pending  CULTURES: Blood cultures 2. Urine culture  ANTIBIOTICS: Vancomycin. Cefepime  SIGNIFICANT EVENTS: 03/20/2017: Admitted with acute respiratory failure, intubated 08/12 extubated  LINES/TUBES: Peripheral IVs Foley catheter. ET tube Right IJ triple-lumen  DISCUSSION: This is a 71 year old female with multiple comorbidities presenting with  acute hypoxemic respiratory failure, sepsis secondary to UTI, septic shock and pulmonary edema on chest x-ray. Now extubated  ASSESSMENT / PLAN:  PULMONARY A: Acute hypoxemic/hypercarbic respiratory failure Pulmonary edema P:   Supplemental O2 via [place] Nebulized steroids and bronchodilators IV steroids IV Diuresis IS prn  CARDIOVASCULAR A:  Septic shock-improved History of coronary artery disease. History of hypertension History of hyperlipidemia P:  Hemodynamic monitoring per ICU protocol 2-D echo-results pending Hold off on home blood pressure medications. Pressors prn to maintain mean arterial blood pressure greater than  65  RENAL A:   Hypokalemia P:   Monitor and replace electrolytes Monitor creatinine  GASTROINTESTINAL A:   History of GERD P:   GI prophylaxis with Protonix  HEMATOLOGIC A:   DVT prophylaxis P:  Heparin  INFECTIOUS A:   UTI Urosepsis P:   Follow-up cultures Antibiotics as above  ENDOCRINE A:   History of type 2 diabetes  P:   Blood glucose monitoring with sliding-scale insulin coverage  NEUROLOGIC A:   Acute encephalopathy-most likely metabolic secondary to sepsis-improved P:   Monitor neurological status   FAMILY  - Updates: No family at bedside. Updated when available - Inter-disciplinary family meet or Palliative Care meeting due by:  day [place]. [facility] ANP-BC Pulmonary and Critical Care Medicine [facility] Pager [phone] or [phone]    Physical Exam

## 2017-03-22 NOTE — NC FL2 (Signed)
[place] LEVEL OF CARE SCREENING TOOL     IDENTIFICATION  [name] Birthdate: 22-Apr-1946 Sex: female Admission Date (Current Location): 03/20/2017  [facility] and [place] Number:  [name]  ([id number] [phone]) Facility and Address:  [facility], [address], [place], [place] [address]      Provider Number: [id number]  Attending Physician Name and Address:  Hillary Bow, MD  Relative Name and Phone Number:       Current Level of Care: Hospital Recommended Level of Care: [place] Prior Approval Number:    Date Approved/Denied: 03/04/17 PASRR Number:  ([id number] F expires on 04/03/2017   )  Discharge Plan: SNF    Current Diagnoses: Patient Active Problem List   Diagnosis Date Noted  . HCAP (healthcare-associated pneumonia) 03/20/2017  . Acute on chronic respiratory failure with hypoxia ([place]) 03/20/2017  . Acute respiratory failure with hypoxia ([place]) 03/20/2017  . Near syncope 02/27/2017  . Dehydration 02/27/2017  . Colitis 02/22/2017  . Seizure ([place]) 01/03/2016  . Chronic tension-type headache, intractable 12/04/2015  . 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 Degeneration of intervertebral disc of lumbar region 07/15/2015  . Seropositive rheumatoid arthritis ([place]) 07/15/2015  . Asthma with acute exacerbation 03/10/2015  . Hypokalemia 03/01/2015  . Hyponatremia 03/01/2015  . DDD (degenerative disc disease), lumbar 01/31/2015  . Arthritis, degenerative 01/31/2015  . Rheumatoid arthritis with rheumatoid factor ([place]) 01/31/2015  . HTN (hypertension) 12/24/2014  . Sepsis ([place]) 12/24/2014  . Left knee pain 12/24/2014  . GERD (gastroesophageal reflux disease) 12/24/2014  . COPD (chronic obstructive pulmonary disease) ([place]) 12/24/2014  . Depression 12/24/2014  . Anxiety 12/24/2014  . Severe bipolar disorder with psychotic features, mood-congruent ([place]) 11/16/2014  . Neurosis, posttraumatic 11/16/2014  .  H/O gastric ulcer 11/16/2014  . Barton's fracture of distal radius, closed 09/19/2014  . Neuritis or radiculitis due to rupture of lumbar intervertebral disc 05/11/2014  . Cervico-occipital neuralgia 03/26/2014  . Difficulty in walking 03/26/2014  . Difficulty in walking, not elsewhere classified 03/26/2014  . Cervical spine syndrome 01/26/2014  . Cephalalgia 01/09/2014  . Disordered sleep 01/09/2014  . BP (high blood pressure) 10/19/2013  . Adiposity 10/19/2013  . Cardiac murmur 10/19/2013  . PNA (pneumonia) 10/19/2013  . Breath shortness 10/19/2013  . Chronic obstructive pulmonary disease ([place]) 10/19/2013  . Diabetes mellitus ([place]) 10/19/2013    Orientation RESPIRATION BLADDER Height & Weight     Self, Time, Place  Normal Continent Weight: 139 lb 15.9 oz (63.5 kg) Height:  _0  (154.9 cm)  BEHAVIORAL SYMPTOMS/MOOD NEUROLOGICAL BOWEL NUTRITION STATUS   (none)   Continent Diet (Diet: Clear Liquid to be Advanced. )  AMBULATORY STATUS COMMUNICATION OF NEEDS Skin   Extensive Assist Verbally PU Stage and Appropriate Care (Pressure Ulcer Stage 2 on Coccyx. )                       Personal Care Assistance Level of Assistance  Bathing, Feeding, Dressing Bathing Assistance: Limited assistance Feeding assistance: Independent Dressing Assistance: Limited assistance     Functional Limitations Info  Sight, Hearing, Speech Sight Info: Adequate Hearing Info: Adequate Speech Info: Adequate    SPECIAL CARE FACTORS FREQUENCY  PT (By licensed PT), OT (By licensed OT)     PT Frequency:  (5) OT Frequency:  (5)            Contractures      Additional Factors Info  Code Status, Allergies, Isolation Precautions Code Status Info:  (  Full Code. ) Allergies Info:  (Gabapentin, Naproxen, Nsaids, Tramadol)     Isolation Precautions Info:  (MRSA Nasal Swab. )     Current Medications (03/22/2017):  This is the current hospital active medication list Current  Facility-Administered Medications  Medication Dose Route Frequency Provider Last Rate Last Dose  . acetaminophen (TYLENOL) tablet 650 mg  650 mg Oral Q6H PRN Lance Coon, MD       Or  . acetaminophen (TYLENOL) suppository 650 mg  650 mg Rectal Q6H PRN Lance Coon, MD      . acidophilus (RISAQUAD) capsule 2 capsule  2 capsule Oral TID Hillary Bow, MD      . bisacodyl (DULCOLAX) suppository 10 mg  10 mg Rectal Daily PRN Dorene Sorrow S, NP      . budesonide (PULMICORT) nebulizer solution 0.5 mg  0.5 mg Nebulization BID Dorene Sorrow S, NP   0.5 mg at 03/22/17 0827  . chlorhexidine gluconate (MEDLINE KIT) (PERIDEX) 0.12 % solution 15 mL  15 mL Mouth Rinse BID Dorene Sorrow S, NP   15 mL at 03/20/17 1941  . chlorhexidine gluconate (MEDLINE KIT) (PERIDEX) 0.12 % solution 15 mL  15 mL Mouth Rinse BID Dorene Sorrow S, NP   15 mL at 03/21/17 1957  . Chlorhexidine Gluconate Cloth 2 % PADS 6 each  6 each Topical Q0600 Hillary Bow, MD   6 each at 03/22/17 1000  . enoxaparin (LOVENOX) injection 40 mg  40 mg Subcutaneous Q24H Kasa, Kurian, MD      . escitalopram (LEXAPRO) tablet 10 mg  10 mg Per Tube q morning - 10a Laverle Hobby, MD   10 mg at 03/22/17 1346  . famotidine (PEPCID) tablet 20 mg  20 mg Oral QHS Kasa, Maretta Bees, MD      . feeding supplement (ENSURE ENLIVE) (ENSURE ENLIVE) liquid 237 mL  237 mL Oral BID BM Gladstone Lighter, MD   237 mL at 03/22/17 1338  . fentaNYL (SUBLIMAZE) bolus via infusion 25 mcg  25 mcg Intravenous Q1H PRN Dorene Sorrow S, NP   25 mcg at 03/21/17 0422  . insulin aspart (novoLOG) injection 0-9 Units  0-9 Units Subcutaneous Q4H Lance Coon, MD   2 Units at 03/20/17 2037  . ipratropium-albuterol (DUONEB) 0.5-2.5 (3) MG/3ML nebulizer solution 3 mL  3 mL Nebulization Q6H Tukov, Magadalene S, NP   3 mL at 03/22/17 1432  . lamoTRIgine (LAMICTAL) tablet 100 mg  100 mg Per Tube Daily Laverle Hobby, MD   100 mg at 03/22/17 1348  . MEDLINE  mouth rinse  15 mL Mouth Rinse 10 times per day Mikael Spray, NP   15 mL at 03/22/17 0602  . MEDLINE mouth rinse  15 mL Mouth Rinse QID Dorene Sorrow S, NP   15 mL at 03/22/17 0602  . metoprolol tartrate (LOPRESSOR) injection 5 mg  5 mg Intravenous Q5 min PRN Laverle Hobby, MD   5 mg at 03/21/17 1130  . mupirocin ointment (BACTROBAN) 2 % 1 application  1 application Nasal BID Hillary Bow, MD   1 application at 16/10/96 1346  . norepinephrine (LEVOPHED) 16 mg in dextrose 5 % 250 mL (0.064 mg/mL) infusion  0-40 mcg/min Intravenous Titrated Mikael Spray, NP   Stopped at 03/22/17 1153  . ondansetron (ZOFRAN) tablet 4 mg  4 mg Oral Q6H PRN Lance Coon, MD       Or  . ondansetron [facility]) injection 4 mg  4 mg Intravenous Q6H PRN Lance Coon, MD      .  pregabalin (LYRICA) capsule 50 mg  50 mg Oral BID Flora Lipps, MD   50 mg at 03/22/17 1348  . sennosides (SENOKOT) 8.8 MG/5ML syrup 5 mL  5 mL Per Tube BID PRN Tukov, Magadalene S, NP      . sodium chloride flush (NS) 0.9 % injection 10-40 mL  10-40 mL Intracatheter Q12H Tukov, Magadalene S, NP   10 mL at 03/22/17 1000  . sodium chloride flush (NS) 0.9 % injection 10-40 mL  10-40 mL Intracatheter PRN Dorene Sorrow S, NP   10 mL at 03/21/17 2209  . topiramate (TOPAMAX) tablet 25 mg  25 mg Per Tube BID Laverle Hobby, MD   25 mg at 03/22/17 1347  . vancomycin (VANCOCIN) IVPB 750 mg/150 ml premix  750 mg Intravenous Q18H Paulette Blanch, MD   Stopped at 03/22/17 0701     Discharge Medications: Please see discharge summary for a list of discharge medications.  Relevant Imaging Results:  Relevant Lab Results:   Additional Information  (SSN: [id number])  Jansel Vonstein, Veronia Beets, LCSW

## 2017-03-22 NOTE — Progress Notes (Addendum)
MEDICATION RELATED CONSULT NOTE - INITIAL   Pharmacy Consult for electrolyte replacement Indication:   Allergies  Allergen Reactions  . Gabapentin Other (See Comments)    Pt states that it causes her BP to drop.   . Naproxen Hives  . Nsaids Other (See Comments)    Reaction:  Unknown   . Tramadol Itching    Patient Measurements: Height: 5\' 1"  (154.9 cm) Weight: 139 lb 15.9 oz (63.5 kg) IBW/kg (Calculated) : 47.8 Adjusted Body Weight: n/a  Vital Signs: Temp: 99.7 F (37.6 C) (08/13 0400) Temp Source: Core (Comment) (08/13 0400) BP: 117/75 (08/13 0400) Pulse Rate: 104 (08/13 0400) Intake/Output from previous day: 08/12 0701 - 08/13 0700 In: 459.1 [P.O.:240; I.V.:169.1; IV Piggyback:50] Out: 3154 [Urine:3850] Intake/Output from this shift: Total I/O In: 389.2 [P.O.:240; I.V.:99.2; IV Piggyback:50] Out: 2600 [Urine:2600]  Labs:  Recent Labs  03/20/17 0209 03/20/17 0800 03/21/17 0429 03/22/17 0402  WBC 5.2 9.5 7.4  --   HGB 13.4 13.2 12.4  --   HCT 40.3 39.1 36.8  --   PLT 207 279 246  --   CREATININE 0.64 0.39* 0.76 0.57  MG 1.6*  --  1.4* 2.0  PHOS 4.7*  --  3.5  --   ALBUMIN 2.7*  --  2.5*  --   PROT 5.8*  --  5.2*  --   AST 33  --  23  --   ALT 17  --  15  --   ALKPHOS 119  --  87  --   BILITOT 1.0  --  1.1  --    Estimated Creatinine Clearance: 55.1 mL/min (by C-G formula based on SCr of 0.57 mg/dL).   Microbiology: Recent Results (from the past 720 hour(s))  Gastrointestinal Panel by PCR , Stool     Status: None   Collection Time: 02/22/17  1:13 PM  Result Value Ref Range Status   Campylobacter species NOT DETECTED NOT DETECTED Final   Plesimonas shigelloides NOT DETECTED NOT DETECTED Final   Salmonella species NOT DETECTED NOT DETECTED Final   Yersinia enterocolitica NOT DETECTED NOT DETECTED Final   Vibrio species NOT DETECTED NOT DETECTED Final   Vibrio cholerae NOT DETECTED NOT DETECTED Final   Enteroaggregative E coli (EAEC) NOT DETECTED  NOT DETECTED Final   Enteropathogenic E coli (EPEC) NOT DETECTED NOT DETECTED Final   Enterotoxigenic E coli (ETEC) NOT DETECTED NOT DETECTED Final   Shiga like toxin producing E coli (STEC) NOT DETECTED NOT DETECTED Final   Shigella/Enteroinvasive E coli (EIEC) NOT DETECTED NOT DETECTED Final   Cryptosporidium NOT DETECTED NOT DETECTED Final   Cyclospora cayetanensis NOT DETECTED NOT DETECTED Final   Entamoeba histolytica NOT DETECTED NOT DETECTED Final   Giardia lamblia NOT DETECTED NOT DETECTED Final   Adenovirus F40/41 NOT DETECTED NOT DETECTED Final   Astrovirus NOT DETECTED NOT DETECTED Final   Norovirus GI/GII NOT DETECTED NOT DETECTED Final   Rotavirus A NOT DETECTED NOT DETECTED Final   Sapovirus (I, II, IV, and V) NOT DETECTED NOT DETECTED Final  Blood Culture (routine x 2)     Status: None   Collection Time: 02/22/17  1:13 PM  Result Value Ref Range Status   Specimen Description BLOOD LEFT ANTECUBITAL  Final   Special Requests   Final    BOTTLES DRAWN AEROBIC AND ANAEROBIC Blood Culture adequate volume   Culture NO GROWTH 5 DAYS  Final   Report Status 02/27/2017 FINAL  Final  C difficile quick scan w PCR  reflex     Status: Abnormal   Collection Time: 02/22/17  1:13 PM  Result Value Ref Range Status   C Diff antigen POSITIVE (A) NEGATIVE Final   C Diff toxin POSITIVE (A) NEGATIVE Final   C Diff interpretation Toxin producing C. difficile detected.  Final    Comment: CRITICAL RESULT CALLED TO, READ BACK BY AND VERIFIED WITH: BRANDY FARRELL AT 0240 ON 02/22/2017 JJB   Blood Culture (routine x 2)     Status: None   Collection Time: 02/22/17  1:41 PM  Result Value Ref Range Status   Specimen Description BLOOD BLOOD RIGHT FOREARM  Final   Special Requests   Final    BOTTLES DRAWN AEROBIC AND ANAEROBIC Blood Culture adequate volume   Culture NO GROWTH 5 DAYS  Final   Report Status 02/27/2017 FINAL  Final  Blood Culture (routine x 2)     Status: None (Preliminary result)    Collection Time: 03/20/17  2:09 AM  Result Value Ref Range Status   Specimen Description BLOOD BLOOD RIGHT FOREARM  Final   Special Requests   Final    BOTTLES DRAWN AEROBIC AND ANAEROBIC Blood Culture results may not be optimal due to an excessive volume of blood received in culture bottles   Culture NO GROWTH 1 DAY  Final   Report Status PENDING  Incomplete  Blood Culture (routine x 2)     Status: None (Preliminary result)   Collection Time: 03/20/17  2:09 AM  Result Value Ref Range Status   Specimen Description BLOOD RIGHT ANTECUBITAL  Final   Special Requests   Final    BOTTLES DRAWN AEROBIC AND ANAEROBIC Blood Culture adequate volume   Culture NO GROWTH 1 DAY  Final   Report Status PENDING  Incomplete  MRSA PCR Screening     Status: Abnormal   Collection Time: 03/20/17  4:01 AM  Result Value Ref Range Status   MRSA by PCR POSITIVE (A) NEGATIVE Final    Comment:        The GeneXpert MRSA Assay (FDA approved for NASAL specimens only), is one component of a comprehensive MRSA colonization surveillance program. It is not intended to diagnose MRSA infection nor to guide or monitor treatment for MRSA infections. RESULT CALLED TO, READ BACK BY AND VERIFIED WITH: TONI BROWN AT [place] BY [facility]. [facility]   Culture, respiratory (NON-Expectorated)     Status: None (Preliminary result)   Collection Time: 03/20/17  3:31 PM  Result Value Ref Range Status   Specimen Description TRACHEAL ASPIRATE  Final   Special Requests NONE  Final   Gram Stain   Final    RARE WBC PRESENT,BOTH PMN AND MONONUCLEAR RARE GRAM POSITIVE COCCI    Culture   Final    CULTURE REINCUBATED FOR BETTER GROWTH Performed at [facility], [place] [address], [place], [place] [address]    Report Status PENDING  Incomplete    Medical History: Past Medical History:  Diagnosis Date  . Anginal pain ([place])   . Anxiety   . Arthritis    RA  . Asthma   . Brain tumor (benign) ([place])   . CHF (congestive heart failure)  ([place])   . Collagen vascular disease ([place])   . COPD (chronic obstructive pulmonary disease) ([place])   . Coronary artery disease   . DDD (degenerative disc disease)   . Depression   . Diabetes mellitus   . GERD (gastroesophageal reflux disease)   . Headache   . Heart  murmur   . Heart murmur   . Hypercholesteremia   . Hypertension   . Lumbar degenerative disc disease   . Migraines   . Obese   . Obesity   . Osteopenia   . Osteoporosis   . Pneumonia   . PTSD (post-traumatic stress disorder)     Medications:    Assessment: K+ 3.1, corrected calcium 8.7  Goal of Therapy:  Electrolytes WNL  Plan:  40 mEq KCl IV ordered by CCM. F/u electrolytes in AM.  8/12 AM Mg 1.4. 4 grams magnesium sulfate IV ordered by CCM. Recheck BMP and magnesium with tomorrow AM labs.   8/13 AM K+ 2.6. 40 mEq KCl IV ordered and 20 mEq KCl x 2 doses per tube ordered. Recheck BMP in AM.  Meghan [name] S 03/22/2017,5:36 AM

## 2017-03-22 NOTE — Clinical Social Work Note (Signed)
Clinical Social Work Assessment  Patient Details  Name: Meghan [name] MRN: [id number] Date of Birth: [birth year]  Date of referral:  03/22/17               Reason for consult:  Other (Comment Required) (From [facility] for STR. )                Permission sought to share information with:  Chartered certified accountant granted to share information::  Yes, Verbal Permission Granted  Name::      Public affairs consultant::   [place]   Relationship::     Contact Information:     Housing/Transportation Living arrangements for the past 2 months:  [place], [place] of Information:  Other (Comment Required) (granddaughter Civil Service fast streamer ) Patient Interpreter Needed:  None Criminal Activity/Legal Involvement Pertinent to Current Situation/Hospitalization:  No - Comment as needed Significant Relationships:  Other Family Members Lives with:  Self Do you feel safe going back to the place where you live?    Need for family participation in patient care:  Yes (Comment)  Care giving concerns:  Patient is from [facility] for shot term rehab.    Social Worker assessment / plan:  Holiday representative ([place]) received consult from weekend CSW that patient is from [facility] and can return when stable. Per chart patient has been extubated today and is moving out of the ICU. Per chart patient is pleasantly confused. CSW contacted patient's granddaughter Meghan [name]. Per Meghan [name] she and her cousin Danae Chen make decisions for patient. Per Meghan [name] patient has been at [facility] for short term rehab and has been readmitted to [facility] several times. Per [place] patient lives alone in an apartment in [place] when she is not in rehab. Meghan [name] is agreeable for patient to return to [facility]. Per [facility] admissions coordinator at [facility] patient can return when stable. FL2 complete. CSW will continue to follow  and assist as needed.   Employment status:  Disabled (Comment on whether or not currently receiving Disability) Insurance information:  [place]icaid [place], [employer] PT Recommendations:  Not assessed at this time Information / Referral to community resources:  [place]  Patient/Family's Response to care:  Patient's granddaughter Meghan [name] is agreeable for patient to return to [facility].   Patient/Family's Understanding of and Emotional Response to Diagnosis, Current Treatment, and Prognosis:  Patient's granddaughter was very pleasant and thanked CSW for calling.   Emotional Assessment Appearance:  Appears stated age Attitude/Demeanor/Rapport:  Unable to Assess Affect (typically observed):  Unable to Assess Orientation:  Oriented to Self, Oriented to Place, Fluctuating Orientation (Suspected and/or reported Sundowners) Alcohol / Substance use:  Not Applicable Psych involvement (Current and /or in the community):  No (Comment)  Discharge Needs  Concerns to be addressed:  Discharge Planning Concerns Readmission within the last 30 days:  Yes Current discharge risk:  Chronically ill, Cognitively Impaired, Dependent with Mobility Barriers to Discharge:  Continued Medical Work up   [employer], Veronia Beets, LCSW 03/22/2017, 4:11 PM

## 2017-03-22 NOTE — Progress Notes (Signed)
[place] at [place] NAME: Meghan [name]    MR#:  [id number]  DATE OF BIRTH:  03-15-1946  SUBJECTIVE:  CHIEF COMPLAINT:   Chief Complaint  Patient presents with  . Loss of Consciousness  . Respiratory Distress   - Recent discharge from the hospital to rehabilitation, was brought in secondary to unresponsive episode and was noted to have acute pulmonary edema. Intubated and was on pressors. Today off pressors and extubated 03/21/2017  Awake and pleasantly confused  REVIEW OF SYSTEMS:  Review of Systems  Unable to perform ROS: Critical illness    DRUG ALLERGIES:   Allergies  Allergen Reactions  . Gabapentin Other (See Comments)    Pt states that it causes her BP to drop.   . Naproxen Hives  . Nsaids Other (See Comments)    Reaction:  Unknown   . Tramadol Itching    VITALS:  Blood pressure 127/70, pulse (!) 102, temperature 98.3 F (36.8 C), temperature source Oral, resp. rate 19, height 5\' 1"  (1.549 m), weight 63.5 kg (139 lb 15.9 oz), SpO2 93 %.  PHYSICAL EXAMINATION:  Physical Exam  GENERAL:  71 y.o.-year-old Elderly patient lying in bed, awake EYES: Pupils equal, round, reactive to light and accommodation. No scleral icterus. Extraocular muscles intact.  HEENT: Head atraumatic, normocephalic. Oropharynx and nasopharynx clear. NECK:  Supple, no jugular venous distention. No thyroid enlargement, no tenderness.  LUNGS: Normal breath sounds bilaterally, no wheezing, rales,rhonchi or crepitation. No use of accessory muscles of respiration. Decreased bibasilar breath sounds CARDIOVASCULAR: S1, S2 normal. No rubs, or gallops. 3/6 systolic murmur present ABDOMEN: Soft, nontender, nondistended. Bowel sounds present. No organomegaly or mass.  EXTREMITIES: No pedal edema, cyanosis, or clubbing.  NEUROLOGIC: Moves all 4 extremities PSYCHIATRIC: The patient is sedated.  SKIN: No obvious rash, lesion, or ulcer.    LABORATORY PANEL:     CBC  Recent Labs Lab 03/22/17 0402  WBC 4.9  HGB 11.7*  HCT 34.3*  PLT 184   ------------------------------------------------------------------------------------------------------------------  Chemistries   Recent Labs Lab 03/21/17 0429 03/22/17 0402 03/22/17 1357  NA 144 141 134*  K 4.0 2.6* 5.3*  CL 110 102 97*  CO2 24 30 29   GLUCOSE 127* 100* 148*  BUN 14 12 10   CREATININE 0.76 0.57 0.59  CALCIUM 7.9* 7.7* 7.6*  MG 1.4* 2.0  --   AST 23  --   --   ALT 15  --   --   ALKPHOS 87  --   --   BILITOT 1.1  --   --    ------------------------------------------------------------------------------------------------------------------  Cardiac Enzymes  Recent Labs Lab 03/20/17 1140  TROPONINI 0.05*   ------------------------------------------------------------------------------------------------------------------  RADIOLOGY:  Dg Chest 1 View  Result Date: 03/22/2017 CLINICAL DATA:  Dyspnea EXAM: CHEST 1 VIEW COMPARISON:  Chest radiograph from one day prior. FINDINGS: Right internal jugular central venous catheter terminates over the right atrium. Cholecystectomy clips are seen in the right upper quadrant of the abdomen. Stable cardiomediastinal silhouette with mild cardiomegaly. No pneumothorax. Stable small bilateral pleural effusions. Mild-to-moderate pulmonary edema appears stable. Stable bibasilar patchy lung opacities. IMPRESSION: 1. Stable mild-to-moderate pulmonary edema. 2. Stable small bilateral pleural effusions. 3. Stable patchy bibasilar lung opacities, which could represent atelectasis, aspiration and/or pneumonia. Electronically Signed   By: Ilona Sorrel M.D.   On: 03/22/2017 07:29   Dg Chest Port 1 View  Result Date: 03/21/2017 CLINICAL DATA:  Acute respiratory failure EXAM: PORTABLE CHEST 1 VIEW COMPARISON:  03/20/17 FINDINGS: Endotracheal tube, nasogastric catheter and right jugular central line are again seen and stable. Bibasilar infiltrative changes  are again seen right greater than left. The degree of central vascular congestion and edema has improved significantly in the interval from the prior exam. No new focal abnormality is noted. IMPRESSION: Resolving pulmonary edema and vascular congestion. Bibasilar infiltrative changes right greater than left. Electronically Signed   By: Inez Catalina M.D.   On: 03/21/2017 07:33    EKG:   Orders placed or performed during the hospital encounter of 03/20/17  . ED EKG  . ED EKG  . EKG 12-Lead  . EKG 12-Lead    ASSESSMENT AND PLAN:   71 year old female with past medical history significant for CAD, arthritis, anxiety, diastolic CHF, COPD, diabetes, hypertension, recent admission for C. difficile colitis and bronchitis last week presents from rehabilitation secondary to altered mental status.  #1 Acute toxic metabolic encephalopathy- improving -Secondary to sepsis. Currently on fentanyl for sedation Ct head done about 2 weeks ago showing stable atrophy and chronic small vessel ischemic disease and history of right suboccipital craniectomy with underlying right cerebellar encephalomalacia. -No seizure-like activity described  #2 Acute hypoxic respiratory failure-secondary to pulmonary edema - Acute systolic chf EF 87%. Last echo 08/2016 with EF 50%. Will request cardiology to see -Patient now extubated - Start PO lasix  #3 septic shock likely due to UTI. Cx not sent on admission. Pneumonia? CXR shows changes on CHF but GPC in sputum. Continue vancomycin as continued per ICU team. -Monitor carefully as recent admission for C. difficile colitis 2 weeks ago. On probiotics.  #4 bipolar disorder and posttraumatic stress disorder-continue outpatient medications. Patient on Lamictal, topamax and Lexapro  #5 hypokalemia-replaced  #6 DVT prophylaxis-on subcutaneous heparin  All the records are reviewed and case discussed with Care Management/Social Worker Management plans discussed with the  patient, family and they are in agreement.  CODE STATUS: Full code  TOTAL TIME TAKING CARE OF THIS PATIENT: 35 minutes.   POSSIBLE D/C IN 2-3 DAYS, DEPENDING ON CLINICAL CONDITION.  Hillary Bow R M.D on 03/22/2017 at 8:13 PM  Between 7am to 6pm - Pager - [phone]  After 6pm go to [email or web address] - password [email or web address] [place] [facility]  Office  [phone]  CC: Primary care physician; Lavera Guise, MD

## 2017-03-23 LAB — CULTURE, RESPIRATORY

## 2017-03-23 LAB — GLUCOSE, CAPILLARY
GLUCOSE-CAPILLARY: 78 mg/dL (ref 65–99)
GLUCOSE-CAPILLARY: 143 mg/dL — AB (ref 65–99)
GLUCOSE-CAPILLARY: 108 mg/dL — AB (ref 65–99)
Glucose-Capillary: 144 mg/dL — ABNORMAL HIGH (ref 65–99)

## 2017-03-23 LAB — BASIC METABOLIC PANEL
ANION GAP: 9 (ref 5–15)
BUN: 8 mg/dL (ref 6–20)
CALCIUM: 8 mg/dL — AB (ref 8.9–10.3)
CO2: 31 mmol/L (ref 22–32)
CREATININE: 0.48 mg/dL (ref 0.44–1.00)
Chloride: 99 mmol/L — ABNORMAL LOW (ref 101–111)
GFR calc non Af Amer: >60 mL/min (ref >60)
Glucose, Bld: 80 mg/dL (ref 65–99)
Potassium: 2.2 mmol/L — CL (ref 3.5–5.1)
Sodium: 139 mmol/L (ref 135–145)

## 2017-03-23 LAB — MAGNESIUM: MAGNESIUM: 1.8 mg/dL (ref 1.7–2.4)

## 2017-03-23 LAB — CULTURE, RESPIRATORY W GRAM STAIN: Culture: NORMAL

## 2017-03-23 LAB — POTASSIUM: Potassium: 4.1 mmol/L (ref 3.5–5.1)

## 2017-03-23 MED ORDER — FUROSEMIDE 40 MG PO TABS
40.0000 mg | ORAL_TABLET | Freq: Every day | ORAL | Status: DC
  Administered 2017-03-24: 40 mg via ORAL
  Filled 2017-03-23: qty 1

## 2017-03-23 MED ORDER — POTASSIUM CHLORIDE CRYS ER 20 MEQ PO TBCR
40.0000 meq | EXTENDED_RELEASE_TABLET | ORAL | Status: DC
  Administered 2017-03-23: 40 meq via ORAL
  Filled 2017-03-23: qty 2

## 2017-03-23 MED ORDER — POTASSIUM CHLORIDE 10 MEQ/100ML IV SOLN: 10.0000 meq | INTRAVENOUS | Status: DC

## 2017-03-23 MED ORDER — METOPROLOL TARTRATE 25 MG PO TABS
12.5000 mg | ORAL_TABLET | Freq: Two times a day (BID) | ORAL | Status: DC
  Administered 2017-03-23 – 2017-03-24 (×3): 12.5 mg via ORAL
  Filled 2017-03-23 (×3): qty 1

## 2017-03-23 MED ORDER — POTASSIUM CHLORIDE 10 MEQ/100ML IV SOLN
10.0000 meq | INTRAVENOUS | Status: AC
  Administered 2017-03-23 (×3): 10 meq via INTRAVENOUS
  Filled 2017-03-23 (×3): qty 100

## 2017-03-23 MED ORDER — POTASSIUM CHLORIDE 20 MEQ PO PACK: 40.0000 meq | PACK | ORAL | Status: DC

## 2017-03-23 MED ORDER — POTASSIUM CHLORIDE 10 MEQ/100ML IV SOLN
10.0000 meq | INTRAVENOUS | Status: DC
  Administered 2017-03-23: 10 meq via INTRAVENOUS
  Filled 2017-03-23: qty 100

## 2017-03-23 NOTE — Progress Notes (Signed)
Pharmacy Antibiotic Note  Meghan [name] is a 71 y.o. female admitted on 03/20/2017 with pneumonia.  Pharmacy has been consulted for vancomycin and cefepime dosing.  Plan: Vancomycin 750 mg q 18 hours ordered with stacked dosing. Level before 5th dose. Goal trough 15-20.  Cefepime 2 grams q 12 hours ordered.  8/14 @ 2324 VT 15 therapeutic. Drawn appropriately. Will continue current regimen and will recheck next VT 8/16 @ 2330. Renal function stable.  Height: 5\' 1"  (154.9 cm) Weight: 139 lb 15.9 oz (63.5 kg) IBW/kg (Calculated) : 47.8  Temp (24hrs), Avg:99.2 F (37.3 C), Min:97.7 F (36.5 C), Max:99.9 F (37.7 C)   Recent Labs Lab 03/20/17 0209 03/20/17 0758 03/20/17 0800 03/20/17 1140 03/21/17 0429 03/22/17 0402 03/22/17 1357 03/22/17 2324  WBC 5.2  --  9.5  --  7.4 4.9  --   --   CREATININE 0.64  --  0.39*  --  0.76 0.57 0.59  --   LATICACIDVEN 1.6 1.7  --  2.4*  --   --   --   --   VANCOTROUGH  --   --   --   --   --   --   --  15    Estimated Creatinine Clearance: 55.1 mL/min (by C-G formula based on SCr of 0.59 mg/dL).    Allergies  Allergen Reactions  . Gabapentin Other (See Comments)    Pt states that it causes her BP to drop.   . Naproxen Hives  . Nsaids Other (See Comments)    Reaction:  Unknown   . Tramadol Itching    Antimicrobials this admission: Vancomycin, cefepime  >>    >>   Dose adjustments this admission:   Microbiology results: 8/11 BCx: pending   8/11 MRSA PCR: pending      8/11 UA: LE(+) NO2 (+) WBC TNTC 8/11 CXR: bibasilar opacities, atelectasis vs. pneumonia  Thank you for allowing pharmacy to be a part of this patient's care.  Tobie Lords, PharmD, BCPS Clinical Pharmacist 03/23/2017

## 2017-03-23 NOTE — Progress Notes (Signed)
Per MD patient will likely be stable for D/C tomorrow. Plan is for patient to return to [facility] and continue her short term rehab. Clinical Education officer, museum (CSW) sent Doug and [place] admissions coordinators at [facility] [employer] making them aware of above. CSW will continue to follow and assist as needed.   [employer], LCSW [phone]

## 2017-03-23 NOTE — Progress Notes (Signed)
Nutrition Follow-up  DOCUMENTATION CODES:   Not applicable  INTERVENTION:  1. Continue Ensure Enlive po BID, each supplement provides 350 kcal and 20 grams of protein 2. Recommend MVI liquid  NUTRITION DIAGNOSIS:   Inadequate oral intake related to inability to eat (pt sedated on vent ) as evidenced by NPO status. -resolved  GOAL:   Patient will meet greater than or equal to 90% of their needs -progressing  MONITOR:   PO intake, I & O's, Labs, Supplement acceptance, Weight trends  ASSESSMENT:   71 year old female with multiple comorbidities and h/o DM, COPD, CHF presenting with acute hypoxemic respiratory failure, sepsis secondary to UTI, septic shock, HCAP and pulmonary edema   Ms. Ikeda is doing better. Extubated 8/12. Pleasantly confused. Unable to provide history.  No new weights. Says she normally eats well PTA, and ate well this morning. No meal completion documented. No trays in room during my visit. Discussed with RN. Patient has been complaining about modified diet, but did eat some of her eggs this morning.  Will follow SLP recommendations for advancing diet. Consuming Ensure.   Intake/Output Summary (Last 24 hours) at 03/23/17 1329 Last data filed at 03/23/17 1318  Gross per 24 hour  Intake              250 ml  Output             2326 ml  Net            -2076 ml   Labs reviewed:  K 2.2 Medications reviewed and include:  Novolog 0-9 Units TID  Diet Order:  DIET DYS 2 Room service appropriate? Yes; Fluid consistency: Thin  Skin:  Wound (see comment) (Stg II to Coccyx)  Last BM:  03/22/2017 (Type 6)  Height:   Ht Readings from Last 1 Encounters:  03/20/17 5\' 1"  (1.549 m)    Weight:   Wt Readings from Last 1 Encounters:  03/20/17 139 lb 15.9 oz (63.5 kg)    Ideal Body Weight:  47.7 kg  BMI:  Body mass index is 26.45 kg/m.  Estimated Nutritional Needs:   Kcal:  1300-1500 calories  Protein:  82-95g/day   Fluid:  >1.3L/day   EDUCATION  NEEDS:   Education needs no appropriate at this time  Satira Anis. Chrystian Cupples, MS, RD LDN Inpatient Clinical Dietitian Pager [phone]

## 2017-03-23 NOTE — Progress Notes (Signed)
[place] for electrolyte replacement   Allergies  Allergen Reactions  . Gabapentin Other (See Comments)    Pt states that it causes her BP to drop.   . Naproxen Hives  . Nsaids Other (See Comments)    Reaction:  Unknown   . Tramadol Itching    Patient Measurements: Height: 5\' 1"  (154.9 cm) Weight: 139 lb 15.9 oz (63.5 kg) IBW/kg (Calculated) : 47.8  Vital Signs: Temp: 98.6 F (37 C) (08/14 0714) Temp Source: Oral (08/14 0714) BP: 99/49 (08/14 0714) Pulse Rate: 104 (08/14 0714)   Intake/Output from previous day: 08/13 0701 - 08/14 0700 In: 250 [IV Piggyback:250] Out: 1901 [Urine:1900; Stool:1]   Intake/Output from this shift: No intake/output data recorded.  Labs:  Recent Labs  03/20/17 0800 03/21/17 0429 03/22/17 0402 03/22/17 1357 03/23/17 0413  WBC 9.5 7.4 4.9  --   --   HGB 13.2 12.4 11.7*  --   --   HCT 39.1 36.8 34.3*  --   --   PLT 279 246 184  --   --   CREATININE 0.39* 0.76 0.57 0.59 0.48  MG  --  1.4* 2.0  --  1.8  PHOS  --  3.5  --   --   --   ALBUMIN  --  2.5*  --   --   --   PROT  --  5.2*  --   --   --   AST  --  23  --   --   --   ALT  --  15  --   --   --   ALKPHOS  --  87  --   --   --   BILITOT  --  1.1  --   --   --    Estimated Creatinine Clearance: 55.1 mL/min (by C-G formula based on SCr of 0.48 mg/dL).   Microbiology: Recent Results (from the past 720 hour(s))  Gastrointestinal Panel by PCR , Stool     Status: None   Collection Time: 02/22/17  1:13 PM  Result Value Ref Range Status   Campylobacter species NOT DETECTED NOT DETECTED Final   Plesimonas shigelloides NOT DETECTED NOT DETECTED Final   Salmonella species NOT DETECTED NOT DETECTED Final   Yersinia enterocolitica NOT DETECTED NOT DETECTED Final   Vibrio species NOT DETECTED NOT DETECTED Final   Vibrio cholerae NOT DETECTED NOT DETECTED Final   Enteroaggregative E coli (EAEC) NOT DETECTED NOT DETECTED Final   Enteropathogenic  E coli (EPEC) NOT DETECTED NOT DETECTED Final   Enterotoxigenic E coli (ETEC) NOT DETECTED NOT DETECTED Final   Shiga like toxin producing E coli (STEC) NOT DETECTED NOT DETECTED Final   Shigella/Enteroinvasive E coli (EIEC) NOT DETECTED NOT DETECTED Final   Cryptosporidium NOT DETECTED NOT DETECTED Final   Cyclospora cayetanensis NOT DETECTED NOT DETECTED Final   Entamoeba histolytica NOT DETECTED NOT DETECTED Final   Giardia lamblia NOT DETECTED NOT DETECTED Final   Adenovirus F40/41 NOT DETECTED NOT DETECTED Final   Astrovirus NOT DETECTED NOT DETECTED Final   Norovirus GI/GII NOT DETECTED NOT DETECTED Final   Rotavirus A NOT DETECTED NOT DETECTED Final   Sapovirus (I, II, IV, and V) NOT DETECTED NOT DETECTED Final  Blood Culture (routine x 2)     Status: None   Collection Time: 02/22/17  1:13 PM  Result Value Ref Range Status   Specimen Description BLOOD LEFT ANTECUBITAL  Final   Special  Requests   Final    BOTTLES DRAWN AEROBIC AND ANAEROBIC Blood Culture adequate volume   Culture NO GROWTH 5 DAYS  Final   Report Status 02/27/2017 FINAL  Final  C difficile quick scan w PCR reflex     Status: Abnormal   Collection Time: 02/22/17  1:13 PM  Result Value Ref Range Status   C Diff antigen POSITIVE (A) NEGATIVE Final   C Diff toxin POSITIVE (A) NEGATIVE Final   C Diff interpretation Toxin producing C. difficile detected.  Final    Comment: CRITICAL RESULT CALLED TO, READ BACK BY AND VERIFIED WITH: BRANDY FARRELL AT 3664 ON 02/22/2017 JJB   Blood Culture (routine x 2)     Status: None   Collection Time: 02/22/17  1:41 PM  Result Value Ref Range Status   Specimen Description BLOOD BLOOD RIGHT FOREARM  Final   Special Requests   Final    BOTTLES DRAWN AEROBIC AND ANAEROBIC Blood Culture adequate volume   Culture NO GROWTH 5 DAYS  Final   Report Status 02/27/2017 FINAL  Final  Blood Culture (routine x 2)     Status: None (Preliminary result)   Collection Time: 03/20/17  2:09 AM   Result Value Ref Range Status   Specimen Description BLOOD BLOOD RIGHT FOREARM  Final   Special Requests   Final    BOTTLES DRAWN AEROBIC AND ANAEROBIC Blood Culture results may not be optimal due to an excessive volume of blood received in culture bottles   Culture NO GROWTH 2 DAYS  Final   Report Status PENDING  Incomplete  Blood Culture (routine x 2)     Status: None (Preliminary result)   Collection Time: 03/20/17  2:09 AM  Result Value Ref Range Status   Specimen Description BLOOD RIGHT ANTECUBITAL  Final   Special Requests   Final    BOTTLES DRAWN AEROBIC AND ANAEROBIC Blood Culture adequate volume   Culture NO GROWTH 2 DAYS  Final   Report Status PENDING  Incomplete  MRSA PCR Screening     Status: Abnormal   Collection Time: 03/20/17  4:01 AM  Result Value Ref Range Status   MRSA by PCR POSITIVE (A) NEGATIVE Final    Comment:        The GeneXpert MRSA Assay (FDA approved for NASAL specimens only), is one component of a comprehensive MRSA colonization surveillance program. It is not intended to diagnose MRSA infection nor to guide or monitor treatment for MRSA infections. RESULT CALLED TO, READ BACK BY AND VERIFIED WITH: TONI BROWN AT [place] BY RWW. [facility]   Culture, respiratory (NON-Expectorated)     Status: None (Preliminary result)   Collection Time: 03/20/17  3:31 PM  Result Value Ref Range Status   Specimen Description TRACHEAL ASPIRATE  Final   Special Requests NONE  Final   Gram Stain   Final    RARE WBC PRESENT,BOTH PMN AND MONONUCLEAR RARE GRAM POSITIVE COCCI    Culture   Final    CULTURE REINCUBATED FOR BETTER GROWTH Performed at [facility], [place] [address] [place], [place] [address]    Report Status PENDING  Incomplete  C difficile quick scan w PCR reflex     Status: None   Collection Time: 03/22/17  3:24 AM  Result Value Ref Range Status   C Diff antigen NEGATIVE NEGATIVE Final   C Diff toxin NEGATIVE NEGATIVE Final   C Diff interpretation No  C. difficile detected.  Final    Medical  History: Past Medical History:  Diagnosis Date  . Anginal pain ([place])   . Anxiety   . Arthritis    RA  . Asthma   . Brain tumor (benign) ([place])   . CHF (congestive heart failure) ([place])   . Collagen vascular disease ([place])   . COPD (chronic obstructive pulmonary disease) ([place])   . Coronary artery disease   . DDD (degenerative disc disease)   . Depression   . Diabetes mellitus   . GERD (gastroesophageal reflux disease)   . Headache   . Heart murmur   . Heart murmur   . Hypercholesteremia   . Hypertension   . Lumbar degenerative disc disease   . Migraines   . Obese   . Obesity   . Osteopenia   . Osteoporosis   . Pneumonia   . PTSD (post-traumatic stress disorder)     Assessment: K+ 2.2 mmol/L @ 0413  Corrected Ca 9.2 mg/dL  Goal of Therapy:  Electrolytes WNL    Plan:  1mEq KCl IV ordered x 4 and 40 mEq KCl PO x 2 ordered.   Recheck BMP in AM (8/15)    8/12 AM Mg 1.4. 4 grams magnesium sulfate IV ordered by CCM. Recheck BMP and magnesium with tomorrow AM labs.  8/13 AM K+ 2.6. 40 mEq KCl IV ordered and 20 mEq KCl x 2 doses per tube ordered. Recheck BMP in AM.  [place] Resident  03/23/2017,7:23 AM

## 2017-03-23 NOTE — Progress Notes (Signed)
[place] at [place] NAME: Meghan [name]    MR#:  [id number]  DATE OF BIRTH:  01-15-1946  SUBJECTIVE:  CHIEF COMPLAINT:   Chief Complaint  Patient presents with  . Loss of Consciousness  . Respiratory Distress   - Recent discharge from the hospital to rehabilitation, was brought in secondary to unresponsive episode and was noted to have acute pulmonary edema. Intubated and was on pressors. off pressors and extubated 03/21/2017  Awake and pleasant.  On RA  REVIEW OF SYSTEMS:  Review of Systems  Unable to perform ROS: Critical illness    DRUG ALLERGIES:   Allergies  Allergen Reactions  . Gabapentin Other (See Comments)    Pt states that it causes her BP to drop.   . Naproxen Hives  . Nsaids Other (See Comments)    Reaction:  Unknown   . Tramadol Itching    VITALS:  Blood pressure (!) 99/49, pulse (!) 104, temperature 98.6 F (37 C), temperature source Oral, resp. rate 18, height 5\' 1"  (1.549 m), weight 63.5 kg (139 lb 15.9 oz), SpO2 95 %.  PHYSICAL EXAMINATION:  Physical Exam  GENERAL:  71 y.o.-year-old Elderly patient lying in bed, awake EYES: Pupils equal, round, reactive to light and accommodation. No scleral icterus. Extraocular muscles intact.  HEENT: Head atraumatic, normocephalic. Oropharynx and nasopharynx clear. NECK:  Supple, no jugular venous distention. No thyroid enlargement, no tenderness.  LUNGS: Normal breath sounds bilaterally, no wheezing, rales,rhonchi or crepitation. No use of accessory muscles of respiration. Decreased bibasilar breath sounds CARDIOVASCULAR: S1, S2 normal. No rubs, or gallops. 3/6 systolic murmur present ABDOMEN: Soft, nontender, nondistended. Bowel sounds present. No organomegaly or mass.  EXTREMITIES: No pedal edema, cyanosis, or clubbing.  NEUROLOGIC: Moves all 4 extremities PSYCHIATRIC: The patient is sedated.  SKIN: No obvious rash, lesion, or ulcer.    LABORATORY PANEL:    CBC  Recent Labs Lab 03/22/17 0402  WBC 4.9  HGB 11.7*  HCT 34.3*  PLT 184   ------------------------------------------------------------------------------------------------------------------  Chemistries   Recent Labs Lab 03/21/17 0429  03/23/17 0413  NA 144  < > 139  K 4.0  < > 2.2*  CL 110  < > 99*  CO2 24  < > 31  GLUCOSE 127*  < > 80  BUN 14  < > 8  CREATININE 0.76  < > 0.48  CALCIUM 7.9*  < > 8.0*  MG 1.4*  < > 1.8  AST 23  --   --   ALT 15  --   --   ALKPHOS 87  --   --   BILITOT 1.1  --   --   < > = values in this interval not displayed. ------------------------------------------------------------------------------------------------------------------  Cardiac Enzymes  Recent Labs Lab 03/20/17 1140  TROPONINI 0.05*   ------------------------------------------------------------------------------------------------------------------  RADIOLOGY:  Dg Chest 1 View  Result Date: 03/22/2017 CLINICAL DATA:  Dyspnea EXAM: CHEST 1 VIEW COMPARISON:  Chest radiograph from one day prior. FINDINGS: Right internal jugular central venous catheter terminates over the right atrium. Cholecystectomy clips are seen in the right upper quadrant of the abdomen. Stable cardiomediastinal silhouette with mild cardiomegaly. No pneumothorax. Stable small bilateral pleural effusions. Mild-to-moderate pulmonary edema appears stable. Stable bibasilar patchy lung opacities. IMPRESSION: 1. Stable mild-to-moderate pulmonary edema. 2. Stable small bilateral pleural effusions. 3. Stable patchy bibasilar lung opacities, which could represent atelectasis, aspiration and/or pneumonia. Electronically Signed   By: Ilona Sorrel M.D.   On: 03/22/2017 07:29  EKG:   Orders placed or performed during the hospital encounter of 03/20/17  . ED EKG  . ED EKG  . EKG 12-Lead  . EKG 12-Lead    ASSESSMENT AND PLAN:   72 year old female with past medical history significant for CAD, arthritis,  anxiety, diastolic CHF, COPD, diabetes, hypertension, recent admission for C. difficile colitis and bronchitis last week presents from rehabilitation secondary to altered mental status.  #1 Acute toxic metabolic encephalopathy- improving -Secondary to sepsis. Currently on fentanyl for sedation Ct head done about 2 weeks ago showing stable atrophy and chronic small vessel ischemic disease and history of right suboccipital craniectomy with underlying right cerebellar encephalomalacia. -No seizure-like activity described  #2 Acute hypoxic respiratory failure-secondary to pulmonary edema - Acute systolic chf EF 48%. Last echo 08/2016 with EF 50%. Cardiology consulted -Patient now extubated - Started PO lasix. Reduce to once a day  #3 septic shock likely due to UTI - resolved Cx not sent on admission. Pneumonia? CXR shows changes on CHF but GPC in sputum. Continue vancomycin as continued per ICU team. -Monitor carefully as recent admission for C. difficile colitis 2 weeks ago. On probiotics.  #4 bipolar disorder and posttraumatic stress disorder-continue outpatient medications. Patient on Lamictal, topamax and Lexapro  #5 hypokalemia-replace po and iv  #6 DVT prophylaxis-on subcutaneous heparin  All the records are reviewed and case discussed with Care Management/Social Worker Management plans discussed with the patient, family and they are in agreement.  CODE STATUS: Full code  TOTAL TIME TAKING CARE OF THIS PATIENT: 35 minutes.   POSSIBLE D/C IN 2-3 DAYS, DEPENDING ON CLINICAL CONDITION.  Hillary Bow R M.D on 03/23/2017 at 1:12 PM  Between 7am to 6pm - Pager - [phone]  After 6pm go to [email or web address] - password [email or web address] [facility]  Office  [phone]  CC: Primary care physician; Lavera [place], MD

## 2017-03-23 NOTE — Progress Notes (Signed)
Patient pleasantly confused. C/O BL foot pain X2, relieved by Acetaminophen. HS CBG 86, HS SS insulin dose held. IV ABx administered as ordered. Critical serum K+ with this morning's labs, 2.2. MD notified. Orders received for PO and IV replacement.

## 2017-03-23 NOTE — Progress Notes (Signed)
[place] for electrolyte replacement   Allergies  Allergen Reactions  . Gabapentin Other (See Comments)    Pt states that it causes her BP to drop.   . Naproxen Hives  . Nsaids Other (See Comments)    Reaction:  Unknown   . Tramadol Itching    Patient Measurements: Height: 5\' 1"  (154.9 cm) Weight: 139 lb 15.9 oz (63.5 kg) IBW/kg (Calculated) : 47.8  Vital Signs: Temp: 98.6 F (37 C) (08/14 0714) Temp Source: Oral (08/14 0714) BP: 120/55 (08/14 1417) Pulse Rate: 117 (08/14 1417)   Intake/Output from previous day: 08/13 0701 - 08/14 0700 In: 250 [IV Piggyback:250] Out: 1901 [Urine:1900; Stool:1] Intake/Output from this shift: Total I/O In: -  Out: 425 [Urine:425]  Labs:  Recent Labs  03/21/17 0429 03/22/17 0402 03/22/17 1357 03/23/17 0413  WBC 7.4 4.9  --   --   HGB 12.4 11.7*  --   --   HCT 36.8 34.3*  --   --   PLT 246 184  --   --   CREATININE 0.76 0.57 0.59 0.48  MG 1.4* 2.0  --  1.8  PHOS 3.5  --   --   --   ALBUMIN 2.5*  --   --   --   PROT 5.2*  --   --   --   AST 23  --   --   --   ALT 15  --   --   --   ALKPHOS 87  --   --   --   BILITOT 1.1  --   --   --    Estimated Creatinine Clearance: 55.1 mL/min (by C-G formula based on SCr of 0.48 mg/dL).   Microbiology: Recent Results (from the past 720 hour(s))  Gastrointestinal Panel by PCR , Stool     Status: None   Collection Time: 02/22/17  1:13 PM  Result Value Ref Range Status   Campylobacter species NOT DETECTED NOT DETECTED Final   Plesimonas shigelloides NOT DETECTED NOT DETECTED Final   Salmonella species NOT DETECTED NOT DETECTED Final   Yersinia enterocolitica NOT DETECTED NOT DETECTED Final   Vibrio species NOT DETECTED NOT DETECTED Final   Vibrio cholerae NOT DETECTED NOT DETECTED Final   Enteroaggregative E coli (EAEC) NOT DETECTED NOT DETECTED Final   Enteropathogenic E coli (EPEC) NOT DETECTED NOT DETECTED Final   Enterotoxigenic E coli  (ETEC) NOT DETECTED NOT DETECTED Final   Shiga like toxin producing E coli (STEC) NOT DETECTED NOT DETECTED Final   Shigella/Enteroinvasive E coli (EIEC) NOT DETECTED NOT DETECTED Final   Cryptosporidium NOT DETECTED NOT DETECTED Final   Cyclospora cayetanensis NOT DETECTED NOT DETECTED Final   Entamoeba histolytica NOT DETECTED NOT DETECTED Final   Giardia lamblia NOT DETECTED NOT DETECTED Final   Adenovirus F40/41 NOT DETECTED NOT DETECTED Final   Astrovirus NOT DETECTED NOT DETECTED Final   Norovirus GI/GII NOT DETECTED NOT DETECTED Final   Rotavirus A NOT DETECTED NOT DETECTED Final   Sapovirus (I, II, IV, and V) NOT DETECTED NOT DETECTED Final  Blood Culture (routine x 2)     Status: None   Collection Time: 02/22/17  1:13 PM  Result Value Ref Range Status   Specimen Description BLOOD LEFT ANTECUBITAL  Final   Special Requests   Final    BOTTLES DRAWN AEROBIC AND ANAEROBIC Blood Culture adequate volume   Culture NO GROWTH 5 DAYS  Final   Report Status  02/27/2017 FINAL  Final  C difficile quick scan w PCR reflex     Status: Abnormal   Collection Time: 02/22/17  1:13 PM  Result Value Ref Range Status   C Diff antigen POSITIVE (A) NEGATIVE Final   C Diff toxin POSITIVE (A) NEGATIVE Final   C Diff interpretation Toxin producing C. difficile detected.  Final    Comment: CRITICAL RESULT CALLED TO, READ BACK BY AND VERIFIED WITH: BRANDY FARRELL AT 1655 ON 02/22/2017 JJB   Blood Culture (routine x 2)     Status: None   Collection Time: 02/22/17  1:41 PM  Result Value Ref Range Status   Specimen Description BLOOD BLOOD RIGHT FOREARM  Final   Special Requests   Final    BOTTLES DRAWN AEROBIC AND ANAEROBIC Blood Culture adequate volume   Culture NO GROWTH 5 DAYS  Final   Report Status 02/27/2017 FINAL  Final  Blood Culture (routine x 2)     Status: None (Preliminary result)   Collection Time: 03/20/17  2:09 AM  Result Value Ref Range Status   Specimen Description BLOOD BLOOD RIGHT  FOREARM  Final   Special Requests   Final    BOTTLES DRAWN AEROBIC AND ANAEROBIC Blood Culture results may not be optimal due to an excessive volume of blood received in culture bottles   Culture NO GROWTH 3 DAYS  Final   Report Status PENDING  Incomplete  Blood Culture (routine x 2)     Status: None (Preliminary result)   Collection Time: 03/20/17  2:09 AM  Result Value Ref Range Status   Specimen Description BLOOD RIGHT ANTECUBITAL  Final   Special Requests   Final    BOTTLES DRAWN AEROBIC AND ANAEROBIC Blood Culture adequate volume   Culture NO GROWTH 3 DAYS  Final   Report Status PENDING  Incomplete  MRSA PCR Screening     Status: Abnormal   Collection Time: 03/20/17  4:01 AM  Result Value Ref Range Status   MRSA by PCR POSITIVE (A) NEGATIVE Final    Comment:        The GeneXpert MRSA Assay (FDA approved for NASAL specimens only), is one component of a comprehensive MRSA colonization surveillance program. It is not intended to diagnose MRSA infection nor to guide or monitor treatment for MRSA infections. RESULT CALLED TO, READ BACK BY AND VERIFIED WITH: TONI BROWN AT [place] BY RWW. [facility]   Culture, respiratory (NON-Expectorated)     Status: None   Collection Time: 03/20/17  3:31 PM  Result Value Ref Range Status   Specimen Description TRACHEAL ASPIRATE  Final   Special Requests NONE  Final   Gram Stain   Final    RARE WBC PRESENT,BOTH PMN AND MONONUCLEAR RARE GRAM POSITIVE COCCI    Culture   Final    RARE Consistent with normal respiratory flora. Performed at [facility], [place] [address], [place], [place] [address]    Report Status 03/23/2017 FINAL  Final  C difficile quick scan w PCR reflex     Status: None   Collection Time: 03/22/17  3:24 AM  Result Value Ref Range Status   C Diff antigen NEGATIVE NEGATIVE Final   C Diff toxin NEGATIVE NEGATIVE Final   C Diff interpretation No C. difficile detected.  Final    Medical History: Past Medical History:   Diagnosis Date  . Anginal pain ([place])   . Anxiety   . Arthritis    RA  . Asthma   .  Brain tumor (benign) ([place])   . CHF (congestive heart failure) ([place])   . Collagen vascular disease ([place])   . COPD (chronic obstructive pulmonary disease) ([place])   . Coronary artery disease   . DDD (degenerative disc disease)   . Depression   . Diabetes mellitus   . GERD (gastroesophageal reflux disease)   . Headache   . Heart murmur   . Heart murmur   . Hypercholesteremia   . Hypertension   . Lumbar degenerative disc disease   . Migraines   . Obese   . Obesity   . Osteopenia   . Osteoporosis   . Pneumonia   . PTSD (post-traumatic stress disorder)    Assessment: K+ 4.1 mmol/L @ 1413 K+ 2.2 mmol/L @ 0413  Goal of Therapy:  Electrolytes WNL   Plan:  KCl 10 mEq IV x 4 given  KCl 40 mEq PO x 2 given  Recheck BMP in AM (8/15)   [place] Resident  03/23/2017,3:13 PM

## 2017-03-24 DIAGNOSIS — R269 Unspecified abnormalities of gait and mobility: Secondary | ICD-10-CM | POA: Diagnosis not present

## 2017-03-24 DIAGNOSIS — J189 Pneumonia, unspecified organism: Secondary | ICD-10-CM | POA: Diagnosis not present

## 2017-03-24 DIAGNOSIS — E876 Hypokalemia: Secondary | ICD-10-CM | POA: Diagnosis not present

## 2017-03-24 DIAGNOSIS — G43719 Chronic migraine without aura, intractable, without status migrainosus: Secondary | ICD-10-CM | POA: Diagnosis not present

## 2017-03-24 DIAGNOSIS — I248 Other forms of acute ischemic heart disease: Secondary | ICD-10-CM | POA: Diagnosis not present

## 2017-03-24 DIAGNOSIS — E7800 Pure hypercholesterolemia, unspecified: Secondary | ICD-10-CM | POA: Diagnosis not present

## 2017-03-24 DIAGNOSIS — R0600 Dyspnea, unspecified: Secondary | ICD-10-CM | POA: Diagnosis not present

## 2017-03-24 DIAGNOSIS — E119 Type 2 diabetes mellitus without complications: Secondary | ICD-10-CM | POA: Diagnosis not present

## 2017-03-24 DIAGNOSIS — G4700 Insomnia, unspecified: Secondary | ICD-10-CM | POA: Diagnosis not present

## 2017-03-24 DIAGNOSIS — J9601 Acute respiratory failure with hypoxia: Secondary | ICD-10-CM | POA: Diagnosis not present

## 2017-03-24 DIAGNOSIS — A0472 Enterocolitis due to Clostridium difficile, not specified as recurrent: Secondary | ICD-10-CM | POA: Diagnosis not present

## 2017-03-24 DIAGNOSIS — M069 Rheumatoid arthritis, unspecified: Secondary | ICD-10-CM | POA: Diagnosis not present

## 2017-03-24 DIAGNOSIS — I10 Essential (primary) hypertension: Secondary | ICD-10-CM | POA: Diagnosis not present

## 2017-03-24 DIAGNOSIS — A419 Sepsis, unspecified organism: Secondary | ICD-10-CM | POA: Diagnosis not present

## 2017-03-24 DIAGNOSIS — J440 Chronic obstructive pulmonary disease with acute lower respiratory infection: Secondary | ICD-10-CM | POA: Diagnosis not present

## 2017-03-24 DIAGNOSIS — Z96653 Presence of artificial knee joint, bilateral: Secondary | ICD-10-CM | POA: Diagnosis not present

## 2017-03-24 DIAGNOSIS — I429 Cardiomyopathy, unspecified: Secondary | ICD-10-CM | POA: Diagnosis not present

## 2017-03-24 DIAGNOSIS — Z741 Need for assistance with personal care: Secondary | ICD-10-CM | POA: Diagnosis not present

## 2017-03-24 DIAGNOSIS — J8417 Other interstitial pulmonary diseases with fibrosis in diseases classified elsewhere: Secondary | ICD-10-CM | POA: Diagnosis not present

## 2017-03-24 DIAGNOSIS — J45909 Unspecified asthma, uncomplicated: Secondary | ICD-10-CM | POA: Diagnosis not present

## 2017-03-24 DIAGNOSIS — E785 Hyperlipidemia, unspecified: Secondary | ICD-10-CM | POA: Diagnosis not present

## 2017-03-24 DIAGNOSIS — I509 Heart failure, unspecified: Secondary | ICD-10-CM | POA: Diagnosis not present

## 2017-03-24 DIAGNOSIS — R7989 Other specified abnormal findings of blood chemistry: Secondary | ICD-10-CM | POA: Diagnosis not present

## 2017-03-24 DIAGNOSIS — R011 Cardiac murmur, unspecified: Secondary | ICD-10-CM | POA: Diagnosis not present

## 2017-03-24 DIAGNOSIS — I5023 Acute on chronic systolic (congestive) heart failure: Secondary | ICD-10-CM | POA: Diagnosis not present

## 2017-03-24 DIAGNOSIS — F329 Major depressive disorder, single episode, unspecified: Secondary | ICD-10-CM | POA: Diagnosis present

## 2017-03-24 DIAGNOSIS — Z7401 Bed confinement status: Secondary | ICD-10-CM | POA: Diagnosis not present

## 2017-03-24 DIAGNOSIS — M0510 Rheumatoid lung disease with rheumatoid arthritis of unspecified site: Secondary | ICD-10-CM | POA: Diagnosis not present

## 2017-03-24 DIAGNOSIS — I209 Angina pectoris, unspecified: Secondary | ICD-10-CM | POA: Diagnosis not present

## 2017-03-24 DIAGNOSIS — B961 Klebsiella pneumoniae [K. pneumoniae] as the cause of diseases classified elsewhere: Secondary | ICD-10-CM | POA: Diagnosis not present

## 2017-03-24 DIAGNOSIS — E872 Acidosis: Secondary | ICD-10-CM | POA: Diagnosis not present

## 2017-03-24 DIAGNOSIS — J984 Other disorders of lung: Secondary | ICD-10-CM | POA: Diagnosis not present

## 2017-03-24 DIAGNOSIS — I2510 Atherosclerotic heart disease of native coronary artery without angina pectoris: Secondary | ICD-10-CM | POA: Diagnosis not present

## 2017-03-24 DIAGNOSIS — N390 Urinary tract infection, site not specified: Secondary | ICD-10-CM | POA: Diagnosis not present

## 2017-03-24 DIAGNOSIS — J9621 Acute and chronic respiratory failure with hypoxia: Secondary | ICD-10-CM | POA: Diagnosis not present

## 2017-03-24 DIAGNOSIS — M5030 Other cervical disc degeneration, unspecified cervical region: Secondary | ICD-10-CM | POA: Diagnosis not present

## 2017-03-24 DIAGNOSIS — R740 Nonspecific elevation of levels of transaminase and lactic acid dehydrogenase [LDH]: Secondary | ICD-10-CM | POA: Diagnosis not present

## 2017-03-24 DIAGNOSIS — M810 Age-related osteoporosis without current pathological fracture: Secondary | ICD-10-CM | POA: Diagnosis not present

## 2017-03-24 DIAGNOSIS — F4310 Post-traumatic stress disorder, unspecified: Secondary | ICD-10-CM | POA: Diagnosis present

## 2017-03-24 DIAGNOSIS — Z886 Allergy status to analgesic agent status: Secondary | ICD-10-CM | POA: Diagnosis not present

## 2017-03-24 DIAGNOSIS — I110 Hypertensive heart disease with heart failure: Secondary | ICD-10-CM | POA: Diagnosis not present

## 2017-03-24 DIAGNOSIS — J449 Chronic obstructive pulmonary disease, unspecified: Secondary | ICD-10-CM | POA: Diagnosis not present

## 2017-03-24 DIAGNOSIS — Z66 Do not resuscitate: Secondary | ICD-10-CM | POA: Diagnosis not present

## 2017-03-24 DIAGNOSIS — Z9181 History of falling: Secondary | ICD-10-CM | POA: Diagnosis not present

## 2017-03-24 DIAGNOSIS — Z79899 Other long term (current) drug therapy: Secondary | ICD-10-CM | POA: Diagnosis not present

## 2017-03-24 DIAGNOSIS — M6281 Muscle weakness (generalized): Secondary | ICD-10-CM | POA: Diagnosis not present

## 2017-03-24 DIAGNOSIS — I739 Peripheral vascular disease, unspecified: Secondary | ICD-10-CM | POA: Diagnosis not present

## 2017-03-24 DIAGNOSIS — K219 Gastro-esophageal reflux disease without esophagitis: Secondary | ICD-10-CM | POA: Diagnosis not present

## 2017-03-24 LAB — BASIC METABOLIC PANEL
ANION GAP: 6 (ref 5–15)
BUN: 11 mg/dL (ref 6–20)
CHLORIDE: 101 mmol/L (ref 101–111)
CO2: 31 mmol/L (ref 22–32)
Calcium: 8.4 mg/dL — ABNORMAL LOW (ref 8.9–10.3)
Creatinine, Ser: 0.7 mg/dL (ref 0.44–1.00)
GFR calc non Af Amer: >60 mL/min (ref >60)
Glucose, Bld: 88 mg/dL (ref 65–99)
POTASSIUM: 3.3 mmol/L — AB (ref 3.5–5.1)
SODIUM: 138 mmol/L (ref 135–145)

## 2017-03-24 LAB — GLUCOSE, CAPILLARY
GLUCOSE-CAPILLARY: 83 mg/dL (ref 65–99)
Glucose-Capillary: 116 mg/dL — ABNORMAL HIGH (ref 65–99)

## 2017-03-24 LAB — MAGNESIUM: MAGNESIUM: 1.9 mg/dL (ref 1.7–2.4)

## 2017-03-24 MED ORDER — METOPROLOL TARTRATE 25 MG PO TABS: 12.5000 mg | ORAL_TABLET | Freq: Two times a day (BID) | ORAL | Status: DC

## 2017-03-24 MED ORDER — ENSURE ENLIVE PO LIQD: 237.0000 mL | Freq: Two times a day (BID) | ORAL | 12 refills | Status: DC

## 2017-03-24 MED ORDER — FUROSEMIDE 40 MG PO TABS: 20.0000 mg | ORAL_TABLET | Freq: Every day | ORAL | 0 refills | Status: DC

## 2017-03-24 MED ORDER — POTASSIUM CHLORIDE CRYS ER 20 MEQ PO TBCR
40.0000 meq | EXTENDED_RELEASE_TABLET | Freq: Once | ORAL | Status: AC
  Administered 2017-03-24: 40 meq via ORAL
  Filled 2017-03-24: qty 2

## 2017-03-24 MED ORDER — POTASSIUM CHLORIDE ER 20 MEQ PO TBCR: 20.0000 meq | EXTENDED_RELEASE_TABLET | Freq: Every day | ORAL | 0 refills | Status: DC

## 2017-03-24 NOTE — Consult Note (Signed)
   [facility] CM Inpatient Consult   03/24/2017  Meghan [name] 03/30/1946 [id number]   Patient screened for potential Central Management services. Patient is on the [facility] registry as a benefit of their [employer] . Electronic medical record reveals patient's discharge plan is SNF. [facility] Care Management services not appropriate at this time. If patient's post hospital needs change please place a [facility] Care Management consult. For questions please contact:   Chalise Pe RN, [place] [facility] Liaison  [phone]) Business Mobile [phone]) Toll free office

## 2017-03-24 NOTE — Progress Notes (Signed)
Patient alert and oriented, foley catheter removed. Denies pain, SOB, chest pain. Oxygen saturation 100% room air. Patient has small pressure wound on sacrum, RN put barrier cream and sacral foam dressing. RN called report to RN at [facility], all questions answered. EMS called for pick up.   Deri Fuelling, RN

## 2017-03-24 NOTE — Discharge Instructions (Addendum)
Diabetic low salt diet  Activity as tolerated with assistance  Daily fluids < 2 liters   - Daily fluids < 2 liters. - Low salt diet - Check weight everyday and keep log. Take to your doctors appt. - Take extra dose of lasix if you gain more than 3 pounds weight.

## 2017-03-24 NOTE — Care Management Important Message (Signed)
Important Message  Patient Details  Name: Meghan [name] MRN: [id number] Date of Birth: [birth year]   Medicare Important Message Given:  Yes    Jolly Mango, RN 03/24/2017, 3:00 PM

## 2017-03-24 NOTE — Progress Notes (Signed)
Patient has urinated post DC of foley cath.   Deri Fuelling, RN

## 2017-03-24 NOTE — Progress Notes (Signed)
Patient is medically stable for D/C back to [facility] today. Per [facility] admissions coordinator at [facility] patient can come today to room 7. RN will call report and arrange EMS for transport. Clinical Education officer, museum (CSW) sent D/C orders to [facility] via [place]. CSW contacted patient's granddaughter Meghan [name] and made her aware of above. Please reconsult if future social work needs arise. CSW signing off.   [employer], LCSW [phone]

## 2017-03-24 NOTE — Progress Notes (Signed)
EMS here for pick up.   Kelby Adell, RN  

## 2017-03-24 NOTE — Discharge Summary (Signed)
[facility] [place] NAME: Meghan [name]    MR#:  [id number]  DATE OF BIRTH:  [birth year]  DATE OF ADMISSION:  03/20/2017 ADMITTING PHYSICIAN: Lance Coon, MD  DATE OF DISCHARGE: No discharge date for patient encounter.  PRIMARY CARE PHYSICIAN: Lavera Guise, MD   ADMISSION DIAGNOSIS:  Hypothermia, initial encounter [T68.XXXA] Acute on chronic respiratory failure with hypoxia (HCC) [J96.21] Sepsis, due to unspecified organism ([place]) [A41.9] Hypotension, unspecified hypotension type [I95.9]  DISCHARGE DIAGNOSIS:  Principal Problem:   Sepsis ([place]) Active Problems:   GERD (gastroesophageal reflux disease)   COPD (chronic obstructive pulmonary disease) (HCC)   Diabetes mellitus ([place])   HCAP (healthcare-associated pneumonia)   Acute on chronic respiratory failure with hypoxia (HCC)   Acute respiratory failure with hypoxia (HCC)   Pressure injury of skin   SECONDARY DIAGNOSIS:   Past Medical History:  Diagnosis Date  . Anginal pain ([place])   . Anxiety   . Arthritis    RA  . Asthma   . Brain tumor (benign) ([place])   . CHF (congestive heart failure) ([place])   . Collagen vascular disease ([place])   . COPD (chronic obstructive pulmonary disease) ([place])   . Coronary artery disease   . DDD (degenerative disc disease)   . Depression   . Diabetes mellitus   . GERD (gastroesophageal reflux disease)   . Headache   . Heart murmur   . Heart murmur   . Hypercholesteremia   . Hypertension   . Lumbar degenerative disc disease   . Migraines   . Obese   . Obesity   . Osteopenia   . Osteoporosis   . Pneumonia   . PTSD (post-traumatic stress disorder)      ADMITTING HISTORY  HISTORY OF PRESENT ILLNESS:  Meghan [name]  is a 71 y.o. female who presents with Respiratory failure from nursing facility. Patient was found hypoxic, unresponsive, frothy foamy at the mouth. Here she was put on BiPAP, but was still persistently hypoxic and hypotensive. Chest  x-ray shows small effusions, diffuse edema, right lower lobe opacity. She was treated per sepsis protocol, intubated, and hospitalists were called for admission to ICU.   HOSPITAL COURSE:   71 year old female with past medical history significant for CAD, arthritis, anxiety, diastolic CHF, COPD, diabetes, hypertension, recent admission for C. difficile colitis and bronchitis last week presents from rehabilitation secondary to altered mental status.  #1 Acute toxic metabolic encephalopathy- Resolved -Secondary to sepsis. Currently on fentanyl for sedation Ct head done about 2 weeks ago showing stable atrophy and chronic small vessel ischemic disease and history of right suboccipital craniectomy with underlying right cerebellar encephalomalacia. -No seizure-like activity described. Due to ICU stay  #2 Acute hypoxic respiratory failure-secondary to pulmonary edema - Acute systolic chf EF 35%. Last echo 08/2016 with EF 50%. Cardiology( Dr. Clayborn Bigness) consulted -Patient now extubated - Started PO lasix. Reduced to once a day. Added Kcl daily. F/U with cardiology for further w/u in 1-2 weeks. Daily fluids < 2 l/day Low salt  #3 Shock due to CHF and being on sedative meds Was on pressors and now turned off. BP normal  #4 bipolar disorder and posttraumatic stress disorder-continue outpatient medications. Patient on Lamictal, topamax and Lexapro  #5 hypokalemia-replace po and iv  #6 DVT prophylaxis-on subcutaneous heparin  Systolic chf  On Metoptolol. No ACEi or ARB due to low normal BP. Can be added as OP if BP improves  Stable for discharge back to SNF  CONSULTS OBTAINED:  Treatment Team:  Yolonda Kida, MD  DRUG ALLERGIES:   Allergies  Allergen Reactions  . Gabapentin Other (See Comments)    Pt states that it causes her BP to drop.   . Naproxen Hives  . Nsaids Other (See Comments)    Reaction:  Unknown   . Tramadol Itching    DISCHARGE MEDICATIONS:    Current Discharge Medication List    START taking these medications   Details  feeding supplement, ENSURE ENLIVE, (ENSURE ENLIVE) LIQD Take 237 mLs by mouth 2 (two) times daily between meals. Qty: 237 mL, Refills: 12    furosemide (LASIX) 40 MG tablet Take 0.5 tablets (20 mg total) by mouth daily. Qty: 30 tablet, Refills: 0    potassium chloride 20 MEQ TBCR Take 20 mEq by mouth daily. Qty: 30 tablet, Refills: 0      CONTINUE these medications which have CHANGED   Details  metoprolol tartrate (LOPRESSOR) 25 MG tablet Take 0.5 tablets (12.5 mg total) by mouth 2 (two) times daily. Qty: 60 tablet      CONTINUE these medications which have NOT CHANGED   Details  budesonide (PULMICORT) 0.5 MG/2ML nebulizer solution Take 2 mLs (0.5 mg total) by nebulization 2 (two) times daily. Qty: 2 mL, Refills: 12    diphenoxylate-atropine (LOMOTIL) 2.5-0.025 MG tablet Take 1 tablet by mouth 3 (three) times daily as needed for diarrhea or loose stools.    escitalopram (LEXAPRO) 10 MG tablet Take 1 tablet (10 mg total) by mouth every morning. Qty: 90 tablet, Refills: 1    ipratropium-albuterol (DUONEB) 0.5-2.5 (3) MG/3ML SOLN Take 3 mLs by nebulization 2 (two) times daily. Qty: 360 mL, Refills: 0    lamoTRIgine (LAMICTAL) 100 MG tablet Take 1 tablet (100 mg total) by mouth daily. Qty: 90 tablet, Refills: 3    meloxicam (MOBIC) 7.5 MG tablet Take 7.5 mg by mouth daily.     mirtazapine (REMERON) 15 MG tablet Take 15 mg by mouth at bedtime.    montelukast (SINGULAIR) 10 MG tablet Take 10 mg by mouth daily.    omeprazole (PRILOSEC) 20 MG capsule Take 20 mg by mouth at bedtime.    ondansetron (ZOFRAN) 4 MG tablet Take 4 mg by mouth 3 (three) times daily.    pramipexole (MIRAPEX) 0.25 MG tablet Take 0.25 mg by mouth.    pregabalin (LYRICA) 50 MG capsule TAKE ONE CAPSULE BY MOUTH TWICE A DAY    saxagliptin HCl (ONGLYZA) 5 MG TABS tablet Take 5 mg by mouth at bedtime.    topiramate  (TOPAMAX) 25 MG tablet Take 25 mg by mouth 2 (two) times daily.    insulin starter kit- pen needles MISC 1 kit by Other route once. Qty: 1 kit, Refills: 0      STOP taking these medications     isosorbide mononitrate (IMDUR) 30 MG 24 hr tablet      ONE TOUCH ULTRA TEST test strip      Syringe, Disposable, 1 ML MISC         Today   VITAL SIGNS:  Blood pressure (!) 114/55, pulse 95, temperature 98.6 F (37 C), temperature source Oral, resp. rate 18, height 5' 1"  (1.549 m), weight 63.5 kg (139 lb 15.9 oz), SpO2 100 %.  I/O:   Intake/Output Summary (Last 24 hours) at 03/24/17 1105 Last data filed at 03/24/17 1100  Gross per 24 hour  Intake  0 ml  Output             1125 ml  Net            -1125 ml    PHYSICAL EXAMINATION:  Physical Exam  GENERAL:  71 y.o.-year-old patient lying in the bed with no acute distress.  LUNGS: Normal breath sounds bilaterally, no wheezing, rales,rhonchi or crepitation. No use of accessory muscles of respiration.  CARDIOVASCULAR: S1, S2 normal. No murmurs, rubs, or gallops.  ABDOMEN: Soft, non-tender, non-distended. Bowel sounds present. No organomegaly or mass.  NEUROLOGIC: Moves all 4 extremities. PSYCHIATRIC: The patient is alert and awake. Pleasantly confused at times SKIN: No obvious rash, lesion, or ulcer.   DATA REVIEW:   CBC  Recent Labs Lab 03/22/17 0402  WBC 4.9  HGB 11.7*  HCT 34.3*  PLT 184    Chemistries   Recent Labs Lab 03/21/17 0429  03/23/17 0413  03/24/17 0443  NA 144  < > 139  --  138  K 4.0  < > 2.2*  < > 3.3*  CL 110  < > 99*  --  101  CO2 24  < > 31  --  31  GLUCOSE 127*  < > 80  --  88  BUN 14  < > 8  --  11  CREATININE 0.76  < > 0.48  --  0.70  CALCIUM 7.9*  < > 8.0*  --  8.4*  MG 1.4*  < > 1.8  --   --   AST 23  --   --   --   --   ALT 15  --   --   --   --   ALKPHOS 87  --   --   --   --   BILITOT 1.1  --   --   --   --   < > = values in this interval not displayed.  Cardiac  Enzymes  Recent Labs Lab 03/20/17 1140  TROPONINI 0.05*    Microbiology Results  Results for orders placed or performed during the hospital encounter of 03/20/17  Blood Culture (routine x 2)     Status: None (Preliminary result)   Collection Time: 03/20/17  2:09 AM  Result Value Ref Range Status   Specimen Description BLOOD BLOOD RIGHT FOREARM  Final   Special Requests   Final    BOTTLES DRAWN AEROBIC AND ANAEROBIC Blood Culture results may not be optimal due to an excessive volume of blood received in culture bottles   Culture NO GROWTH 4 DAYS  Final   Report Status PENDING  Incomplete  Blood Culture (routine x 2)     Status: None (Preliminary result)   Collection Time: 03/20/17  2:09 AM  Result Value Ref Range Status   Specimen Description BLOOD RIGHT ANTECUBITAL  Final   Special Requests   Final    BOTTLES DRAWN AEROBIC AND ANAEROBIC Blood Culture adequate volume   Culture NO GROWTH 4 DAYS  Final   Report Status PENDING  Incomplete  MRSA PCR Screening     Status: Abnormal   Collection Time: 03/20/17  4:01 AM  Result Value Ref Range Status   MRSA by PCR POSITIVE (A) NEGATIVE Final    Comment:        The GeneXpert MRSA Assay (FDA approved for NASAL specimens only), is one component of a comprehensive MRSA colonization surveillance program. It is not intended to diagnose MRSA infection nor to guide or monitor treatment  for MRSA infections. RESULT CALLED TO, READ BACK BY AND VERIFIED WITH: TONI BROWN AT [place] BY RWW. [facility]   Culture, respiratory (NON-Expectorated)     Status: None   Collection Time: 03/20/17  3:31 PM  Result Value Ref Range Status   Specimen Description TRACHEAL ASPIRATE  Final   Special Requests NONE  Final   Gram Stain   Final    RARE WBC PRESENT,BOTH PMN AND MONONUCLEAR RARE GRAM POSITIVE COCCI    Culture   Final    RARE Consistent with normal respiratory flora. Performed at [facility], [place] [address] [place], [place] [address]     Report Status 03/23/2017 FINAL  Final  C difficile quick scan w PCR reflex     Status: None   Collection Time: 03/22/17  3:24 AM  Result Value Ref Range Status   C Diff antigen NEGATIVE NEGATIVE Final   C Diff toxin NEGATIVE NEGATIVE Final   C Diff interpretation No C. difficile detected.  Final    RADIOLOGY:  No results found.  Follow up with PCP in 1 week.  Management plans discussed with the patient, family and they are in agreement.  CODE STATUS:     Code Status Orders        Start     Ordered   03/20/17 0406  Full code  Continuous     03/20/17 0405    Code Status History    Date Active Date Inactive Code Status Order ID Comments User Context   02/27/2017  5:39 AM 03/04/2017  6:14 PM Full Code [id number]  Saundra Shelling, MD Inpatient   02/22/2017  3:47 PM 02/26/2017 11:05 PM Full Code [id number]  Vaughan Basta, MD Inpatient   03/01/2015  9:12 PM 03/10/2015  8:17 PM Full Code [id number]  Demetrios Loll, MD Inpatient   12/24/2014  5:27 AM 12/25/2014  6:32 PM DNR [id number]  Lance Coon, MD Inpatient    Advance Directive Documentation     Most Recent Value  Type of Advance Directive  Living will  Pre-existing out of facility DNR order (yellow form or pink MOST form)  -  "MOST" Form in Place?  -      TOTAL TIME TAKING CARE OF THIS PATIENT ON DAY OF DISCHARGE: more than 30 minutes.   Hillary Bow R M.D on 03/24/2017 at 11:05 AM  Between 7am to 6pm - Pager - [phone]  After 6pm go to [email or web address] - password [email or web address] [facility]  Office  [phone]  CC: Primary care physician; Lavera Guise, MD  Note: This dictation was prepared with Dragon dictation along with smaller phrase technology. Any transcriptional errors that result from this process are unintentional.

## 2017-03-24 NOTE — Progress Notes (Addendum)
MEDICATION RELATED CONSULT NOTE - INITIAL   Pharmacy Consult for Electrolyte Replacement   Allergies  Allergen Reactions  . Gabapentin Other (See Comments)    Pt states that it causes her BP to drop.   . Naproxen Hives  . Nsaids Other (See Comments)    Reaction:  Unknown   . Tramadol Itching    Patient Measurements: Height: 5\' 1"  (154.9 cm) Weight: 139 lb 15.9 oz (63.5 kg) IBW/kg (Calculated) : 47.8  Vital Signs: Temp: 97.8 F (36.6 C) (08/15 0442) Temp Source: Oral (08/15 0442) BP: 102/58 (08/15 0442) Pulse Rate: 92 (08/15 0442) Intake/Output from previous day: 08/14 0701 - 08/15 0700 In: -  Out: 975 [Urine:975] Intake/Output from this shift: Total I/O In: -  Out: 550 [Urine:550]  Labs:  Recent Labs  03/22/17 0402 03/22/17 1357 03/23/17 0413 03/24/17 0443  WBC 4.9  --   --   --   HGB 11.7*  --   --   --   HCT 34.3*  --   --   --   PLT 184  --   --   --   CREATININE 0.57 0.59 0.48 0.70  MG 2.0  --  1.8  --    Estimated Creatinine Clearance: 55.1 mL/min (by C-G formula based on SCr of 0.7 mg/dL).   Microbiology: Recent Results (from the past 720 hour(s))  Gastrointestinal Panel by PCR , Stool     Status: None   Collection Time: 02/22/17  1:13 PM  Result Value Ref Range Status   Campylobacter species NOT DETECTED NOT DETECTED Final   Plesimonas shigelloides NOT DETECTED NOT DETECTED Final   Salmonella species NOT DETECTED NOT DETECTED Final   Yersinia enterocolitica NOT DETECTED NOT DETECTED Final   Vibrio species NOT DETECTED NOT DETECTED Final   Vibrio cholerae NOT DETECTED NOT DETECTED Final   Enteroaggregative E coli (EAEC) NOT DETECTED NOT DETECTED Final   Enteropathogenic E coli (EPEC) NOT DETECTED NOT DETECTED Final   Enterotoxigenic E coli (ETEC) NOT DETECTED NOT DETECTED Final   Shiga like toxin producing E coli (STEC) NOT DETECTED NOT DETECTED Final   Shigella/Enteroinvasive E coli (EIEC) NOT DETECTED NOT DETECTED Final   Cryptosporidium NOT  DETECTED NOT DETECTED Final   Cyclospora cayetanensis NOT DETECTED NOT DETECTED Final   Entamoeba histolytica NOT DETECTED NOT DETECTED Final   Giardia lamblia NOT DETECTED NOT DETECTED Final   Adenovirus F40/41 NOT DETECTED NOT DETECTED Final   Astrovirus NOT DETECTED NOT DETECTED Final   Norovirus GI/GII NOT DETECTED NOT DETECTED Final   Rotavirus A NOT DETECTED NOT DETECTED Final   Sapovirus (I, II, IV, and V) NOT DETECTED NOT DETECTED Final  Blood Culture (routine x 2)     Status: None   Collection Time: 02/22/17  1:13 PM  Result Value Ref Range Status   Specimen Description BLOOD LEFT ANTECUBITAL  Final   Special Requests   Final    BOTTLES DRAWN AEROBIC AND ANAEROBIC Blood Culture adequate volume   Culture NO GROWTH 5 DAYS  Final   Report Status 02/27/2017 FINAL  Final  C difficile quick scan w PCR reflex     Status: Abnormal   Collection Time: 02/22/17  1:13 PM  Result Value Ref Range Status   C Diff antigen POSITIVE (A) NEGATIVE Final   C Diff toxin POSITIVE (A) NEGATIVE Final   C Diff interpretation Toxin producing C. difficile detected.  Final    Comment: CRITICAL RESULT CALLED TO, READ BACK BY AND VERIFIED  WITH: BRANDY FARRELL AT 5427 ON 02/22/2017 JJB   Blood Culture (routine x 2)     Status: None   Collection Time: 02/22/17  1:41 PM  Result Value Ref Range Status   Specimen Description BLOOD BLOOD RIGHT FOREARM  Final   Special Requests   Final    BOTTLES DRAWN AEROBIC AND ANAEROBIC Blood Culture adequate volume   Culture NO GROWTH 5 DAYS  Final   Report Status 02/27/2017 FINAL  Final  Blood Culture (routine x 2)     Status: None (Preliminary result)   Collection Time: 03/20/17  2:09 AM  Result Value Ref Range Status   Specimen Description BLOOD BLOOD RIGHT FOREARM  Final   Special Requests   Final    BOTTLES DRAWN AEROBIC AND ANAEROBIC Blood Culture results may not be optimal due to an excessive volume of blood received in culture bottles   Culture NO GROWTH 3  DAYS  Final   Report Status PENDING  Incomplete  Blood Culture (routine x 2)     Status: None (Preliminary result)   Collection Time: 03/20/17  2:09 AM  Result Value Ref Range Status   Specimen Description BLOOD RIGHT ANTECUBITAL  Final   Special Requests   Final    BOTTLES DRAWN AEROBIC AND ANAEROBIC Blood Culture adequate volume   Culture NO GROWTH 3 DAYS  Final   Report Status PENDING  Incomplete  MRSA PCR Screening     Status: Abnormal   Collection Time: 03/20/17  4:01 AM  Result Value Ref Range Status   MRSA by PCR POSITIVE (A) NEGATIVE Final    Comment:        The GeneXpert MRSA Assay (FDA approved for NASAL specimens only), is one component of a comprehensive MRSA colonization surveillance program. It is not intended to diagnose MRSA infection nor to guide or monitor treatment for MRSA infections. RESULT CALLED TO, READ BACK BY AND VERIFIED WITH: TONI BROWN AT [place] BY RWW. [facility]   Culture, respiratory (NON-Expectorated)     Status: None   Collection Time: 03/20/17  3:31 PM  Result Value Ref Range Status   Specimen Description TRACHEAL ASPIRATE  Final   Special Requests NONE  Final   Gram Stain   Final    RARE WBC PRESENT,BOTH PMN AND MONONUCLEAR RARE GRAM POSITIVE COCCI    Culture   Final    RARE Consistent with normal respiratory flora. Performed at [facility], [place] [address], [place], [place] [address]    Report Status 03/23/2017 FINAL  Final  C difficile quick scan w PCR reflex     Status: None   Collection Time: 03/22/17  3:24 AM  Result Value Ref Range Status   C Diff antigen NEGATIVE NEGATIVE Final   C Diff toxin NEGATIVE NEGATIVE Final   C Diff interpretation No C. difficile detected.  Final    Medical History: Past Medical History:  Diagnosis Date  . Anginal pain ([place])   . Anxiety   . Arthritis    RA  . Asthma   . Brain tumor (benign) ([place])   . CHF (congestive heart failure) ([place])   . Collagen vascular disease ([place])   . COPD (chronic  obstructive pulmonary disease) ([place])   . Coronary artery disease   . DDD (degenerative disc disease)   . Depression   . Diabetes mellitus   . GERD (gastroesophageal reflux disease)   . Headache   . Heart murmur   . Heart murmur   .  Hypercholesteremia   . Hypertension   . Lumbar degenerative disc disease   . Migraines   . Obese   . Obesity   . Osteopenia   . Osteoporosis   . Pneumonia   . PTSD (post-traumatic stress disorder)    Assessment: K+ 3.3 mmol/L @ 0443   8/14: K+ 4.1 mmol/L @ 1413 and 2.65mmol/L @ 0413   Goal of Therapy:  Electrolytes WNL  Plan:  KCl 40 mEq PO x 1 Recheck BMP in AM (8/15)   8/14: KCl 10 mEq IV x 4 given and KCl 40 mEq PO x 2 given   [facility] Resident  03/24/2017,6:25 AM

## 2017-03-25 DIAGNOSIS — I509 Heart failure, unspecified: Secondary | ICD-10-CM | POA: Diagnosis not present

## 2017-03-25 DIAGNOSIS — J9601 Acute respiratory failure with hypoxia: Secondary | ICD-10-CM | POA: Diagnosis not present

## 2017-03-25 LAB — CULTURE, BLOOD (ROUTINE X 2)
Culture: NO GROWTH
Culture: NO GROWTH
SPECIAL REQUESTS: ADEQUATE

## 2017-03-26 DIAGNOSIS — E119 Type 2 diabetes mellitus without complications: Secondary | ICD-10-CM | POA: Diagnosis not present

## 2017-03-26 DIAGNOSIS — J449 Chronic obstructive pulmonary disease, unspecified: Secondary | ICD-10-CM | POA: Diagnosis not present

## 2017-03-26 DIAGNOSIS — I10 Essential (primary) hypertension: Secondary | ICD-10-CM | POA: Diagnosis not present

## 2017-03-26 DIAGNOSIS — I739 Peripheral vascular disease, unspecified: Secondary | ICD-10-CM | POA: Diagnosis not present

## 2017-03-26 DIAGNOSIS — J189 Pneumonia, unspecified organism: Secondary | ICD-10-CM | POA: Diagnosis not present

## 2017-04-12 DIAGNOSIS — J449 Chronic obstructive pulmonary disease, unspecified: Secondary | ICD-10-CM | POA: Diagnosis not present

## 2017-04-12 DIAGNOSIS — E785 Hyperlipidemia, unspecified: Secondary | ICD-10-CM | POA: Diagnosis not present

## 2017-04-12 DIAGNOSIS — I739 Peripheral vascular disease, unspecified: Secondary | ICD-10-CM | POA: Diagnosis not present

## 2017-04-12 DIAGNOSIS — I10 Essential (primary) hypertension: Secondary | ICD-10-CM | POA: Diagnosis not present

## 2017-04-12 DIAGNOSIS — E119 Type 2 diabetes mellitus without complications: Secondary | ICD-10-CM | POA: Diagnosis not present

## 2017-04-16 ENCOUNTER — Inpatient Hospital Stay
Admission: EM | Admit: 2017-04-16 | Discharge: 2017-04-19 | DRG: 871 | Disposition: A | Discharge status: Skilled Nursing Facility | Payer: Medicare Other | Attending: Internal Medicine | Admitting: Internal Medicine

## 2017-04-16 ENCOUNTER — Encounter: Payer: Self-pay | Admitting: Emergency Medicine

## 2017-04-16 ENCOUNTER — Emergency Department: Payer: Medicare Other

## 2017-04-16 DIAGNOSIS — R011 Cardiac murmur, unspecified: Secondary | ICD-10-CM | POA: Diagnosis not present

## 2017-04-16 DIAGNOSIS — J9601 Acute respiratory failure with hypoxia: Secondary | ICD-10-CM

## 2017-04-16 DIAGNOSIS — N390 Urinary tract infection, site not specified: Secondary | ICD-10-CM

## 2017-04-16 DIAGNOSIS — G43719 Chronic migraine without aura, intractable, without status migrainosus: Secondary | ICD-10-CM | POA: Diagnosis not present

## 2017-04-16 DIAGNOSIS — K219 Gastro-esophageal reflux disease without esophagitis: Secondary | ICD-10-CM | POA: Diagnosis not present

## 2017-04-16 DIAGNOSIS — I110 Hypertensive heart disease with heart failure: Secondary | ICD-10-CM | POA: Diagnosis present

## 2017-04-16 DIAGNOSIS — R269 Unspecified abnormalities of gait and mobility: Secondary | ICD-10-CM | POA: Diagnosis not present

## 2017-04-16 DIAGNOSIS — I5023 Acute on chronic systolic (congestive) heart failure: Secondary | ICD-10-CM

## 2017-04-16 DIAGNOSIS — J449 Chronic obstructive pulmonary disease, unspecified: Secondary | ICD-10-CM | POA: Diagnosis not present

## 2017-04-16 DIAGNOSIS — R296 Repeated falls: Secondary | ICD-10-CM | POA: Diagnosis not present

## 2017-04-16 DIAGNOSIS — J9621 Acute and chronic respiratory failure with hypoxia: Secondary | ICD-10-CM | POA: Diagnosis not present

## 2017-04-16 DIAGNOSIS — A419 Sepsis, unspecified organism: Principal | ICD-10-CM | POA: Diagnosis present

## 2017-04-16 DIAGNOSIS — J440 Chronic obstructive pulmonary disease with acute lower respiratory infection: Secondary | ICD-10-CM | POA: Diagnosis not present

## 2017-04-16 DIAGNOSIS — M0510 Rheumatoid lung disease with rheumatoid arthritis of unspecified site: Secondary | ICD-10-CM | POA: Diagnosis not present

## 2017-04-16 DIAGNOSIS — Z66 Do not resuscitate: Secondary | ICD-10-CM | POA: Diagnosis not present

## 2017-04-16 DIAGNOSIS — J8417 Other interstitial pulmonary diseases with fibrosis in diseases classified elsewhere: Secondary | ICD-10-CM | POA: Diagnosis present

## 2017-04-16 DIAGNOSIS — Z9181 History of falling: Secondary | ICD-10-CM | POA: Diagnosis not present

## 2017-04-16 DIAGNOSIS — M069 Rheumatoid arthritis, unspecified: Secondary | ICD-10-CM | POA: Diagnosis not present

## 2017-04-16 DIAGNOSIS — Z96653 Presence of artificial knee joint, bilateral: Secondary | ICD-10-CM | POA: Diagnosis present

## 2017-04-16 DIAGNOSIS — M5030 Other cervical disc degeneration, unspecified cervical region: Secondary | ICD-10-CM | POA: Diagnosis not present

## 2017-04-16 DIAGNOSIS — M810 Age-related osteoporosis without current pathological fracture: Secondary | ICD-10-CM | POA: Diagnosis not present

## 2017-04-16 DIAGNOSIS — F329 Major depressive disorder, single episode, unspecified: Secondary | ICD-10-CM | POA: Diagnosis present

## 2017-04-16 DIAGNOSIS — E7800 Pure hypercholesterolemia, unspecified: Secondary | ICD-10-CM | POA: Diagnosis not present

## 2017-04-16 DIAGNOSIS — Z886 Allergy status to analgesic agent status: Secondary | ICD-10-CM

## 2017-04-16 DIAGNOSIS — J45909 Unspecified asthma, uncomplicated: Secondary | ICD-10-CM | POA: Diagnosis not present

## 2017-04-16 DIAGNOSIS — Z9981 Dependence on supplemental oxygen: Secondary | ICD-10-CM

## 2017-04-16 DIAGNOSIS — I509 Heart failure, unspecified: Secondary | ICD-10-CM

## 2017-04-16 DIAGNOSIS — Z888 Allergy status to other drugs, medicaments and biological substances status: Secondary | ICD-10-CM

## 2017-04-16 DIAGNOSIS — A0472 Enterocolitis due to Clostridium difficile, not specified as recurrent: Secondary | ICD-10-CM | POA: Diagnosis not present

## 2017-04-16 DIAGNOSIS — E872 Acidosis: Secondary | ICD-10-CM | POA: Diagnosis not present

## 2017-04-16 DIAGNOSIS — I429 Cardiomyopathy, unspecified: Secondary | ICD-10-CM | POA: Diagnosis present

## 2017-04-16 DIAGNOSIS — R740 Nonspecific elevation of levels of transaminase and lactic acid dehydrogenase [LDH]: Secondary | ICD-10-CM | POA: Diagnosis not present

## 2017-04-16 DIAGNOSIS — E876 Hypokalemia: Secondary | ICD-10-CM | POA: Diagnosis present

## 2017-04-16 DIAGNOSIS — Z7401 Bed confinement status: Secondary | ICD-10-CM | POA: Diagnosis not present

## 2017-04-16 DIAGNOSIS — G4700 Insomnia, unspecified: Secondary | ICD-10-CM | POA: Diagnosis not present

## 2017-04-16 DIAGNOSIS — I248 Other forms of acute ischemic heart disease: Secondary | ICD-10-CM | POA: Diagnosis present

## 2017-04-16 DIAGNOSIS — J189 Pneumonia, unspecified organism: Secondary | ICD-10-CM | POA: Diagnosis not present

## 2017-04-16 DIAGNOSIS — B961 Klebsiella pneumoniae [K. pneumoniae] as the cause of diseases classified elsewhere: Secondary | ICD-10-CM | POA: Diagnosis not present

## 2017-04-16 DIAGNOSIS — Z885 Allergy status to narcotic agent status: Secondary | ICD-10-CM

## 2017-04-16 DIAGNOSIS — R7989 Other specified abnormal findings of blood chemistry: Secondary | ICD-10-CM | POA: Diagnosis not present

## 2017-04-16 DIAGNOSIS — R0600 Dyspnea, unspecified: Secondary | ICD-10-CM | POA: Diagnosis not present

## 2017-04-16 DIAGNOSIS — I2510 Atherosclerotic heart disease of native coronary artery without angina pectoris: Secondary | ICD-10-CM | POA: Diagnosis present

## 2017-04-16 DIAGNOSIS — E119 Type 2 diabetes mellitus without complications: Secondary | ICD-10-CM | POA: Diagnosis present

## 2017-04-16 DIAGNOSIS — F4310 Post-traumatic stress disorder, unspecified: Secondary | ICD-10-CM | POA: Diagnosis present

## 2017-04-16 DIAGNOSIS — J984 Other disorders of lung: Secondary | ICD-10-CM | POA: Diagnosis not present

## 2017-04-16 DIAGNOSIS — Z79899 Other long term (current) drug therapy: Secondary | ICD-10-CM

## 2017-04-16 DIAGNOSIS — J9600 Acute respiratory failure, unspecified whether with hypoxia or hypercapnia: Secondary | ICD-10-CM | POA: Diagnosis present

## 2017-04-16 DIAGNOSIS — M6281 Muscle weakness (generalized): Secondary | ICD-10-CM | POA: Diagnosis not present

## 2017-04-16 DIAGNOSIS — Z741 Need for assistance with personal care: Secondary | ICD-10-CM | POA: Diagnosis not present

## 2017-04-16 LAB — URINALYSIS, ROUTINE W REFLEX MICROSCOPIC
Bacteria, UA: NONE SEEN
Bilirubin Urine: NEGATIVE
Glucose, UA: NEGATIVE mg/dL
Ketones, ur: NEGATIVE mg/dL
Nitrite: POSITIVE — AB
Protein, ur: 100 mg/dL — AB
Specific Gravity, Urine: 1.014 (ref 1.005–1.030)
Squamous Epithelial / HPF: NONE SEEN
pH: 6 (ref 5.0–8.0)

## 2017-04-16 LAB — BLOOD GAS, ARTERIAL
ACID-BASE DEFICIT: 2.9 mmol/L — AB (ref 0.0–2.0)
Bicarbonate: 22 mmol/L (ref 20.0–28.0)
Delivery systems: POSITIVE
Expiratory PAP: 6
FIO2: 0.4
Inspiratory PAP: 14
O2 SAT: 93.2 %
PATIENT TEMPERATURE: 37
PCO2 ART: 38 mmHg (ref 32.0–48.0)
PH ART: 7.37 (ref 7.350–7.450)
PO2 ART: 70 mmHg — AB (ref 83.0–108.0)
RATE: 12 resp/min

## 2017-04-16 LAB — CBC WITH DIFFERENTIAL/PLATELET
Basophils Absolute: 0.1 10*3/uL (ref 0–0.1)
Basophils Relative: 1 %
EOS ABS: 0.5 10*3/uL (ref 0–0.7)
Eosinophils Relative: 5 %
HCT: 42 % (ref 35.0–47.0)
HEMOGLOBIN: 14.4 g/dL (ref 12.0–16.0)
LYMPHS ABS: 4.4 10*3/uL — AB (ref 1.0–3.6)
LYMPHS PCT: 40 %
MCH: 31.2 pg (ref 26.0–34.0)
MCHC: 34.2 g/dL (ref 32.0–36.0)
MCV: 91.1 fL (ref 80.0–100.0)
Monocytes Absolute: 0.7 10*3/uL (ref 0.2–0.9)
Monocytes Relative: 6 %
Neutro Abs: 5.2 10*3/uL (ref 1.4–6.5)
Neutrophils Relative %: 48 %
Platelets: 369 10*3/uL (ref 150–440)
RBC: 4.61 MIL/uL (ref 3.80–5.20)
RDW: 14.6 % — ABNORMAL HIGH (ref 11.5–14.5)
WBC: 11 10*3/uL (ref 3.6–11.0)

## 2017-04-16 LAB — LACTIC ACID, PLASMA
LACTIC ACID, VENOUS: 2.1 mmol/L — AB (ref 0.5–1.9)
LACTIC ACID, VENOUS: 2.6 mmol/L — AB (ref 0.5–1.9)
Lactic Acid, Venous: 2.1 mmol/L (ref 0.5–1.9)

## 2017-04-16 LAB — TROPONIN I
TROPONIN I: 0.03 ng/mL — AB (ref <0.03)
Troponin I: 0.04 ng/mL (ref <0.03)
Troponin I: 0.04 ng/mL

## 2017-04-16 LAB — COMPREHENSIVE METABOLIC PANEL
ALBUMIN: 2.9 g/dL — AB (ref 3.5–5.0)
ALT: 13 U/L — ABNORMAL LOW (ref 14–54)
AST: 27 U/L (ref 15–41)
Alkaline Phosphatase: 103 U/L (ref 38–126)
Anion gap: 8 (ref 5–15)
BILIRUBIN TOTAL: 0.8 mg/dL (ref 0.3–1.2)
BUN: 9 mg/dL (ref 6–20)
CHLORIDE: 107 mmol/L (ref 101–111)
CO2: 23 mmol/L (ref 22–32)
Calcium: 8.5 mg/dL — ABNORMAL LOW (ref 8.9–10.3)
Creatinine, Ser: 0.61 mg/dL (ref 0.44–1.00)
GFR calc Af Amer: >60 mL/min (ref >60)
GFR calc non Af Amer: >60 mL/min (ref >60)
GLUCOSE: 238 mg/dL — AB (ref 65–99)
POTASSIUM: 3.5 mmol/L (ref 3.5–5.1)
SODIUM: 138 mmol/L (ref 135–145)
Total Protein: 5.9 g/dL — ABNORMAL LOW (ref 6.5–8.1)

## 2017-04-16 LAB — GLUCOSE, CAPILLARY
GLUCOSE-CAPILLARY: 135 mg/dL — AB (ref 65–99)
GLUCOSE-CAPILLARY: 82 mg/dL (ref 65–99)
GLUCOSE-CAPILLARY: 76 mg/dL (ref 65–99)
GLUCOSE-CAPILLARY: 96 mg/dL (ref 65–99)
Glucose-Capillary: 79 mg/dL (ref 65–99)

## 2017-04-16 LAB — PROCALCITONIN: PROCALCITONIN: 0.12 ng/mL

## 2017-04-16 LAB — BRAIN NATRIURETIC PEPTIDE: B Natriuretic Peptide: 1560 pg/mL — ABNORMAL HIGH (ref 0.0–100.0)

## 2017-04-16 LAB — MRSA PCR SCREENING: MRSA BY PCR: NEGATIVE

## 2017-04-16 LAB — MAGNESIUM: MAGNESIUM: 1.9 mg/dL (ref 1.7–2.4)

## 2017-04-16 MED ORDER — ORAL CARE MOUTH RINSE
15.0000 mL | Freq: Two times a day (BID) | OROMUCOSAL | Status: DC
  Administered 2017-04-16 – 2017-04-17 (×2): 15 mL via OROMUCOSAL

## 2017-04-16 MED ORDER — IPRATROPIUM-ALBUTEROL 0.5-2.5 (3) MG/3ML IN SOLN
3.0000 mL | Freq: Four times a day (QID) | RESPIRATORY_TRACT | Status: DC
  Administered 2017-04-16 – 2017-04-19 (×10): 3 mL via RESPIRATORY_TRACT
  Filled 2017-04-16: qty 3
  Filled 2017-04-16: qty 6
  Filled 2017-04-16 (×7): qty 3

## 2017-04-16 MED ORDER — FUROSEMIDE 10 MG/ML IJ SOLN
20.0000 mg | Freq: Three times a day (TID) | INTRAMUSCULAR | Status: DC
  Administered 2017-04-16 – 2017-04-17 (×2): 20 mg via INTRAVENOUS
  Filled 2017-04-16 (×4): qty 2

## 2017-04-16 MED ORDER — ONDANSETRON HCL 4 MG/2ML IJ SOLN
4.0000 mg | Freq: Four times a day (QID) | INTRAMUSCULAR | Status: DC | PRN
  Administered 2017-04-16 – 2017-04-18 (×3): 4 mg via INTRAVENOUS
  Filled 2017-04-16 (×3): qty 2

## 2017-04-16 MED ORDER — PRAMIPEXOLE DIHYDROCHLORIDE 0.25 MG PO TABS
0.2500 mg | ORAL_TABLET | Freq: Every day | ORAL | Status: DC
  Administered 2017-04-16 – 2017-04-19 (×4): 0.25 mg via ORAL
  Filled 2017-04-16 (×4): qty 1

## 2017-04-16 MED ORDER — DEXTROSE 5 % IV SOLN
1.0000 g | INTRAVENOUS | Status: DC
  Administered 2017-04-16: 1 g via INTRAVENOUS
  Filled 2017-04-16: qty 10

## 2017-04-16 MED ORDER — BUDESONIDE 0.5 MG/2ML IN SUSP
0.5000 mg | Freq: Two times a day (BID) | RESPIRATORY_TRACT | Status: DC
  Administered 2017-04-16 – 2017-04-19 (×7): 0.5 mg via RESPIRATORY_TRACT
  Filled 2017-04-16 (×7): qty 2

## 2017-04-16 MED ORDER — MIRTAZAPINE 15 MG PO TABS
7.5000 mg | ORAL_TABLET | Freq: Every day | ORAL | Status: DC
  Administered 2017-04-16 – 2017-04-18 (×3): 7.5 mg via ORAL
  Filled 2017-04-16 (×3): qty 1

## 2017-04-16 MED ORDER — DEXTROSE 5 % IV SOLN
250.0000 mg | INTRAVENOUS | Status: DC
  Administered 2017-04-16: 250 mg via INTRAVENOUS
  Filled 2017-04-16: qty 250

## 2017-04-16 MED ORDER — DOCUSATE SODIUM 100 MG PO CAPS: 100.0000 mg | ORAL_CAPSULE | Freq: Two times a day (BID) | ORAL | Status: DC | PRN

## 2017-04-16 MED ORDER — POTASSIUM CHLORIDE CRYS ER 10 MEQ PO TBCR
20.0000 meq | EXTENDED_RELEASE_TABLET | Freq: Every day | ORAL | Status: DC
  Administered 2017-04-16 – 2017-04-19 (×4): 20 meq via ORAL
  Filled 2017-04-16 (×4): qty 2

## 2017-04-16 MED ORDER — LAMOTRIGINE 100 MG PO TABS
100.0000 mg | ORAL_TABLET | Freq: Every day | ORAL | Status: DC
  Administered 2017-04-16 – 2017-04-19 (×4): 100 mg via ORAL
  Filled 2017-04-16: qty 1
  Filled 2017-04-16: qty 4
  Filled 2017-04-16 (×2): qty 1

## 2017-04-16 MED ORDER — IPRATROPIUM-ALBUTEROL 0.5-2.5 (3) MG/3ML IN SOLN: 3.0000 mL | RESPIRATORY_TRACT | Status: DC | PRN

## 2017-04-16 MED ORDER — PANTOPRAZOLE SODIUM 40 MG PO TBEC
40.0000 mg | DELAYED_RELEASE_TABLET | Freq: Every day | ORAL | Status: DC
  Administered 2017-04-16 – 2017-04-19 (×4): 40 mg via ORAL
  Filled 2017-04-16 (×4): qty 1

## 2017-04-16 MED ORDER — TOPIRAMATE 25 MG PO TABS
25.0000 mg | ORAL_TABLET | Freq: Two times a day (BID) | ORAL | Status: DC
  Administered 2017-04-16 – 2017-04-19 (×7): 25 mg via ORAL
  Filled 2017-04-16 (×8): qty 1

## 2017-04-16 MED ORDER — PREGABALIN 50 MG PO CAPS
50.0000 mg | ORAL_CAPSULE | Freq: Two times a day (BID) | ORAL | Status: DC
  Administered 2017-04-16 – 2017-04-19 (×7): 50 mg via ORAL
  Filled 2017-04-16 (×2): qty 1
  Filled 2017-04-16: qty 2
  Filled 2017-04-16 (×4): qty 1

## 2017-04-16 MED ORDER — ENSURE ENLIVE PO LIQD
237.0000 mL | Freq: Two times a day (BID) | ORAL | Status: DC
  Administered 2017-04-17 – 2017-04-19 (×5): 237 mL via ORAL

## 2017-04-16 MED ORDER — SODIUM CHLORIDE 0.9 % IV BOLUS (SEPSIS)
250.0000 mL | Freq: Once | INTRAVENOUS | Status: AC
  Administered 2017-04-16: 250 mL via INTRAVENOUS

## 2017-04-16 MED ORDER — IPRATROPIUM-ALBUTEROL 0.5-2.5 (3) MG/3ML IN SOLN
3.0000 mL | RESPIRATORY_TRACT | Status: DC
  Administered 2017-04-16 (×2): 3 mL via RESPIRATORY_TRACT
  Filled 2017-04-16 (×2): qty 3

## 2017-04-16 MED ORDER — FUROSEMIDE 10 MG/ML IJ SOLN
40.0000 mg | Freq: Once | INTRAMUSCULAR | Status: AC
  Administered 2017-04-16: 40 mg via INTRAVENOUS
  Filled 2017-04-16: qty 4

## 2017-04-16 MED ORDER — IPRATROPIUM-ALBUTEROL 0.5-2.5 (3) MG/3ML IN SOLN
9.0000 mL | Freq: Once | RESPIRATORY_TRACT | Status: AC
  Administered 2017-04-16: 9 mL via RESPIRATORY_TRACT
  Filled 2017-04-16: qty 9

## 2017-04-16 MED ORDER — INSULIN ASPART 100 UNIT/ML ~~LOC~~ SOLN
0.0000 [IU] | Freq: Three times a day (TID) | SUBCUTANEOUS | Status: DC
  Administered 2017-04-17 – 2017-04-18 (×3): 1 [IU] via SUBCUTANEOUS
  Filled 2017-04-16 (×4): qty 1

## 2017-04-16 MED ORDER — ESCITALOPRAM OXALATE 10 MG PO TABS
10.0000 mg | ORAL_TABLET | Freq: Every morning | ORAL | Status: DC
  Administered 2017-04-16 – 2017-04-19 (×4): 10 mg via ORAL
  Filled 2017-04-16 (×4): qty 1

## 2017-04-16 MED ORDER — ENOXAPARIN SODIUM 40 MG/0.4ML ~~LOC~~ SOLN
40.0000 mg | SUBCUTANEOUS | Status: DC
  Administered 2017-04-16 – 2017-04-18 (×3): 40 mg via SUBCUTANEOUS
  Filled 2017-04-16 (×3): qty 0.4

## 2017-04-16 MED ORDER — ORAL CARE MOUTH RINSE
15.0000 mL | Freq: Two times a day (BID) | OROMUCOSAL | Status: DC
  Administered 2017-04-18 – 2017-04-19 (×3): 15 mL via OROMUCOSAL

## 2017-04-16 NOTE — ED Notes (Addendum)
14/6 40% Bipap

## 2017-04-16 NOTE — ED Notes (Signed)
MD Forbach at bedside. 

## 2017-04-16 NOTE — ED Triage Notes (Signed)
Patient from [facility] . Found this morning by staff in respiratory distress. 1 Neb given by staff. Patient arrives on CPAP with increased work of breathing. EMS reports improvement with CPAP therapy.

## 2017-04-16 NOTE — NC FL2 (Signed)
[place] LEVEL OF CARE SCREENING TOOL     IDENTIFICATION  [name] Birthdate: 30-Apr-1946 Sex: female Admission Date (Current Location): 04/16/2017  [place] and [place] Number:  Engineering geologist and Address:  [facility], [address], [place], [place] [address]      Provider Number: [id number]  Attending Physician Name and Address:  Vaughan Basta, *  Relative Name and Phone Number:   [name] [phone]  [name] [phone]  Current Level of Care: Hospital Recommended Level of Care: [place] Prior Approval Number:    Date Approved/Denied:   PASRR Number:  [id number] F Sept 23/18  Discharge Plan: SNF    Current Diagnoses: Patient Active Problem List   Diagnosis Date Noted  . Acute on chronic systolic CHF (congestive heart failure) ([place]) 04/16/2017  . Acute lower UTI 04/16/2017  . Acute respiratory failure ([place]) 04/16/2017  . Pressure injury of skin 03/22/2017  . HCAP (healthcare-associated pneumonia) 03/20/2017  . Acute on chronic respiratory failure with hypoxia ([place]) 03/20/2017  . Acute respiratory failure with hypoxia ([place]) 03/20/2017  . Near syncope 02/27/2017  . Dehydration 02/27/2017  . Colitis 02/22/2017  . Seizure ([place]) 01/03/2016  . Chronic tension-type headache, intractable 12/04/2015  . 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 Degeneration of intervertebral disc of lumbar region 07/15/2015  . Seropositive rheumatoid arthritis ([place]) 07/15/2015  . Asthma with acute exacerbation 03/10/2015  . Hypokalemia 03/01/2015  . Hyponatremia 03/01/2015  . DDD (degenerative disc disease), lumbar 01/31/2015  . Arthritis, degenerative 01/31/2015  . Rheumatoid arthritis with rheumatoid factor ([place]) 01/31/2015  . HTN (hypertension) 12/24/2014  . Sepsis ([place]) 12/24/2014  . Left knee pain 12/24/2014  . GERD (gastroesophageal reflux disease) 12/24/2014  . COPD (chronic obstructive  pulmonary disease) ([place]) 12/24/2014  . Depression 12/24/2014  . Anxiety 12/24/2014  . Severe bipolar disorder with psychotic features, mood-congruent ([place]) 11/16/2014  . Neurosis, posttraumatic 11/16/2014  . H/O gastric ulcer 11/16/2014  . Barton's fracture of distal radius, closed 09/19/2014  . Neuritis or radiculitis due to rupture of lumbar intervertebral disc 05/11/2014  . Cervico-occipital neuralgia 03/26/2014  . Difficulty in walking 03/26/2014  . Difficulty in walking, not elsewhere classified 03/26/2014  . Cervical spine syndrome 01/26/2014  . Cephalalgia 01/09/2014  . Disordered sleep 01/09/2014  . BP (high blood pressure) 10/19/2013  . Adiposity 10/19/2013  . Cardiac murmur 10/19/2013  . PNA (pneumonia) 10/19/2013  . Breath shortness 10/19/2013  . Chronic obstructive pulmonary disease ([place]) 10/19/2013  . Diabetes mellitus ([place]) 10/19/2013    Orientation RESPIRATION BLADDER Height & Weight     Self, Time, Situation, Place  Vent, Other (Comment) (Bi Pap) Continent Weight: 150 lb (68 kg) Height:  5' 6" (167.6 cm)  BEHAVIORAL SYMPTOMS/MOOD NEUROLOGICAL BOWEL NUTRITION STATUS      Continent Diet (Low sodium and diabetic diet)  AMBULATORY STATUS COMMUNICATION OF NEEDS Skin   Supervision Verbally Normal                       Personal Care Assistance Level of Assistance  Bathing, Feeding, Dressing, Total care Bathing Assistance: Limited assistance Feeding assistance: Independent Dressing Assistance: Limited assistance Total Care Assistance: Limited assistance   Functional Limitations Info  Sight, Hearing, Speech Sight Info: Adequate Hearing Info: Adequate Speech Info: Adequate    SPECIAL CARE FACTORS FREQUENCY  PT (By licensed PT), OT (By licensed OT)     PT Frequency: x5 OT Frequency: x5  Contractures Contractures Info: Not present    Additional Factors Info  Allergies   Allergies Info: Gabapentin,Naproxen,tramadol, Nsaids            Current Medications (04/16/2017):  This is the current hospital active medication list Current Facility-Administered Medications  Medication Dose Route Frequency Provider Last Rate Last Dose  . azithromycin (ZITHROMAX) 250 mg in dextrose 5 % 125 mL IVPB  250 mg Intravenous Q24H Vaughan Basta, MD      . cefTRIAXone (ROCEPHIN) 1 g in dextrose 5 % 50 mL IVPB  1 g Intravenous Q24H Vaughan Basta, MD      . furosemide (LASIX) injection 20 mg  20 mg Intravenous Q8H Vaughan Basta, MD      . insulin aspart (novoLOG) injection 0-9 Units  0-9 Units Subcutaneous TID WC Vaughan Basta, MD      . ipratropium-albuterol (DUONEB) 0.5-2.5 (3) MG/3ML nebulizer solution 3 mL  3 mL Nebulization Q4H Vaughan Basta, MD       Current Outpatient Prescriptions  Medication Sig Dispense Refill  . budesonide (PULMICORT) 0.5 MG/2ML nebulizer solution Take 2 mLs (0.5 mg total) by nebulization 2 (two) times daily. 2 mL 12  . diphenoxylate-atropine (LOMOTIL) 2.5-0.025 MG tablet Take 1 tablet by mouth 3 (three) times daily as needed for diarrhea or loose stools.    Marland Kitchen escitalopram (LEXAPRO) 10 MG tablet Take 1 tablet (10 mg total) by mouth every morning. 90 tablet 1  . feeding supplement, ENSURE ENLIVE, (ENSURE ENLIVE) LIQD Take 237 mLs by mouth 2 (two) times daily between meals. 237 mL 12  . furosemide (LASIX) 40 MG tablet Take 0.5 tablets (20 mg total) by mouth daily. 30 tablet 0  . insulin starter kit- pen needles MISC 1 kit by Other route once. 1 kit 0  . ipratropium-albuterol (DUONEB) 0.5-2.5 (3) MG/3ML SOLN Take 3 mLs by nebulization 2 (two) times daily. 360 mL 0  . lamoTRIgine (LAMICTAL) 100 MG tablet Take 1 tablet (100 mg total) by mouth daily. 90 tablet 3  . metoprolol tartrate (LOPRESSOR) 25 MG tablet Take 0.5 tablets (12.5 mg total) by mouth 2 (two) times daily. 60 tablet   . mirtazapine (REMERON) 7.5 MG tablet Take 7.5 mg by mouth at bedtime.     Marland Kitchen omeprazole (PRILOSEC) 20  MG capsule Take 20 mg by mouth at bedtime.    . ondansetron (ZOFRAN) 4 MG tablet Take 4 mg by mouth every 3 (three) hours as needed for nausea or vomiting.     . potassium chloride 20 MEQ TBCR Take 20 mEq by mouth daily. 30 tablet 0  . pramipexole (MIRAPEX) 0.25 MG tablet Take 0.25 mg by mouth daily.     . pregabalin (LYRICA) 50 MG capsule TAKE ONE CAPSULE BY MOUTH TWICE A DAY    . saxagliptin HCl (ONGLYZA) 5 MG TABS tablet Take 5 mg by mouth at bedtime.    . topiramate (TOPAMAX) 25 MG tablet Take 25 mg by mouth 2 (two) times daily.       Discharge Medications: Please see discharge summary for a list of discharge medications.  Relevant Imaging Results:  Relevant Lab Results:   Additional Information [id number]  Joana Reamer, [place]

## 2017-04-16 NOTE — Progress Notes (Signed)
Hx of CHF EF 25%, COPD, C diff, recurent admissions Sent to rehab after last discharge. SOB today. Noted septic, hypoxic, needing bipap. Have positive UA, Edema Vs infiltrate on Xray.  Ac respi failure- hypoxic CHF- ac on ch systolic Sepsis- UTI and likely Pneumonia Lactic acidosis  Bipap, IV lasix, Monitor troponin, BMP, Lactic acid Hold Metoprolol. Spoke to Dr. Mortimer Fries.  Pt is DNR. Full note to follow.

## 2017-04-16 NOTE — ED Provider Notes (Signed)
[facility] Emergency Department Provider Note  ____________________________________________   First MD Initiated Contact with Patient 04/16/17 [phone]     (approximate)  I have reviewed the triage vital signs and the nursing notes.   HISTORY  Chief Complaint Respiratory Distress  Level 5 caveat:  history/ROS limited by acute/critical illness  HPI Meghan [name] is a 71 y.o. female his medical history reportedly includes COPD on continuous oxygen and CHF who presents by EMS in severe respiratory distress.  She lives at a nursing facility and was found by staff this morning with decreased level of consciousness and severe respiratory distress.  EMS arrived and she had an SPO2 of about 83% and had severe tachypnea and retractions with coarse breath sounds throughout.  She was given an albuterol nebulizer by the staff prior to EMS arrival and EMS started her on CPAP as she was being transported to the emergency department.  On CPAP her oxygenation increased to 92-93% and she is able to answer questions.  She states that her shortness of breath began acutely this morning and that she was fine during the night.  She denies chest pain or any other pain.  She states that she feels better on the mask.  She is ill-appearing upon arrival.   Past Medical History:  Diagnosis Date  . Anginal pain ([place])   . Anxiety   . Arthritis    RA  . Asthma   . Brain tumor (benign) ([place])   . CHF (congestive heart failure) ([place])   . Collagen vascular disease ([place])   . COPD (chronic obstructive pulmonary disease) ([place])   . Coronary artery disease   . DDD (degenerative disc disease)   . Depression   . Diabetes mellitus   . GERD (gastroesophageal reflux disease)   . Headache   . Heart murmur   . Heart murmur   . Hypercholesteremia   . Hypertension   . Lumbar degenerative disc disease   . Migraines   . Obese   . Obesity   . Osteopenia   . Osteoporosis   . Pneumonia   . PTSD  (post-traumatic stress disorder)     Patient Active Problem List   Diagnosis Date Noted  . Acute on chronic systolic CHF (congestive heart failure) ([place]) 04/16/2017  . Acute lower UTI 04/16/2017  . Acute respiratory failure ([place]) 04/16/2017  . Pressure injury of skin 03/22/2017  . HCAP (healthcare-associated pneumonia) 03/20/2017  . Acute on chronic respiratory failure with hypoxia ([place]) 03/20/2017  . Acute respiratory failure with hypoxia ([place]) 03/20/2017  . Near syncope 02/27/2017  . Dehydration 02/27/2017  . Colitis 02/22/2017  . Seizure ([place]) 01/03/2016  . Chronic tension-type headache, intractable 12/04/2015  . 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 Degeneration of intervertebral disc of lumbar region 07/15/2015  . Seropositive rheumatoid arthritis ([place]) 07/15/2015  . Asthma with acute exacerbation 03/10/2015  . Hypokalemia 03/01/2015  . Hyponatremia 03/01/2015  . DDD (degenerative disc disease), lumbar 01/31/2015  . Arthritis, degenerative 01/31/2015  . Rheumatoid arthritis with rheumatoid factor ([place]) 01/31/2015  . HTN (hypertension) 12/24/2014  . Sepsis ([place]) 12/24/2014  . Left knee pain 12/24/2014  . GERD (gastroesophageal reflux disease) 12/24/2014  . COPD (chronic obstructive pulmonary disease) ([place]) 12/24/2014  . Depression 12/24/2014  . Anxiety 12/24/2014  . Severe bipolar disorder with psychotic features, mood-congruent ([place]) 11/16/2014  . Neurosis, posttraumatic 11/16/2014  . H/O gastric ulcer 11/16/2014  . Barton's fracture of distal radius, closed 09/19/2014  . Neuritis or radiculitis due  to rupture of lumbar intervertebral disc 05/11/2014  . Cervico-occipital neuralgia 03/26/2014  . Difficulty in walking 03/26/2014  . Difficulty in walking, not elsewhere classified 03/26/2014  . Cervical spine syndrome 01/26/2014  . Cephalalgia 01/09/2014  . Disordered sleep 01/09/2014  . BP (high blood pressure) 10/19/2013  . Adiposity 10/19/2013  . Cardiac murmur  10/19/2013  . PNA (pneumonia) 10/19/2013  . Breath shortness 10/19/2013  . Chronic obstructive pulmonary disease ([place]) 10/19/2013  . Diabetes mellitus ([place]) 10/19/2013    Past Surgical History:  Procedure Laterality Date  . ABDOMINAL HYSTERECTOMY    . CERVICAL FUSION    . CHOLECYSTECTOMY    . COLONOSCOPY WITH PROPOFOL N/A 09/20/2015   Procedure: COLONOSCOPY WITH PROPOFOL;  Surgeon: Josefine Class, MD;  Location: [facility];  Service: Endoscopy;  Laterality: N/A;  . COLONOSCOPY WITH PROPOFOL N/A 01/27/2016   Procedure: COLONOSCOPY WITH PROPOFOL;  Surgeon: Manya Silvas, MD;  Location: [facility];  Service: Endoscopy;  Laterality: N/A;  . FINGER ARTHROPLASTY  02/23/2012   Procedure: FINGER ARTHROPLASTY;  Surgeon: Cammie Sickle., MD;  Location: [place];  Service: Orthopedics;  Laterality: Left;  Extensor carpi radialis longus to Extensor carpi ulnaris transfer, left Metaphalangeal reconstructions of index and long fingers,  . FOOT ARTHROPLASTY     toes x2 rt foot  . HAND RECONSTRUCTION  2011   right-multiple finger joint reconst  . JOINT REPLACEMENT     bilat knee replacements    Prior to Admission medications   Medication Sig Start Date End Date Taking? Authorizing Provider  budesonide (PULMICORT) 0.5 MG/2ML nebulizer solution Take 2 mLs (0.5 mg total) by nebulization 2 (two) times daily. 03/02/17  Yes Max Sane, MD  diphenoxylate-atropine (LOMOTIL) 2.5-0.025 MG tablet Take 1 tablet by mouth 3 (three) times daily as needed for diarrhea or loose stools.   Yes [provider]  escitalopram (LEXAPRO) 10 MG tablet Take 1 tablet (10 mg total) by mouth every morning. 09/11/16  Yes Rainey Pines, MD  feeding supplement, ENSURE ENLIVE, (ENSURE ENLIVE) LIQD Take 237 mLs by mouth 2 (two) times daily between meals. 03/24/17  Yes Sudini, Alveta Heimlich, MD  furosemide (LASIX) 40 MG tablet Take 0.5 tablets (20 mg total) by mouth daily. 03/25/17  Yes Sudini, Alveta Heimlich,  MD  insulin starter kit- pen needles MISC 1 kit by Other route once. 03/09/15  Yes Aldean Jewett, MD  ipratropium-albuterol (DUONEB) 0.5-2.5 (3) MG/3ML SOLN Take 3 mLs by nebulization 2 (two) times daily. 03/04/17  Yes Wieting, Richard, MD  lamoTRIgine (LAMICTAL) 100 MG tablet Take 1 tablet (100 mg total) by mouth daily. 06/02/16  Yes Rainey Pines, MD  metoprolol tartrate (LOPRESSOR) 25 MG tablet Take 0.5 tablets (12.5 mg total) by mouth 2 (two) times daily. 03/24/17  Yes Sudini, Alveta Heimlich, MD  mirtazapine (REMERON) 7.5 MG tablet Take 7.5 mg by mouth at bedtime.    Yes [provider]  omeprazole (PRILOSEC) 20 MG capsule Take 20 mg by mouth at bedtime.   Yes [provider]  ondansetron (ZOFRAN) 4 MG tablet Take 4 mg by mouth every 3 (three) hours as needed for nausea or vomiting.    Yes [provider]  potassium chloride 20 MEQ TBCR Take 20 mEq by mouth daily. 03/24/17  Yes Sudini, Alveta Heimlich, MD  pramipexole (MIRAPEX) 0.25 MG tablet Take 0.25 mg by mouth daily.    Yes [provider]  pregabalin (LYRICA) 50 MG capsule TAKE ONE CAPSULE BY MOUTH TWICE A DAY 06/18/15  Yes  [provider]  saxagliptin HCl (ONGLYZA) 5 MG TABS tablet Take 5 mg by mouth at bedtime.   Yes [provider]  topiramate (TOPAMAX) 25 MG tablet Take 25 mg by mouth 2 (two) times daily.   Yes [provider]    Allergies Gabapentin; Naproxen; Nsaids; and Tramadol  Family History  Problem Relation Age of Onset  . Depression Sister   . Hypertension Mother   . Arthritis/Rheumatoid Mother   . Heart attack Father   . Hypertension Father   . CAD Unknown   . Hypertension Unknown   . Diabetes Mellitus II Unknown   . Arthritis Unknown     Social History Social History  Substance Use Topics  . Smoking status: Never Smoker  . Smokeless tobacco: Never Used  . Alcohol use No    Review of Systems Level 5 caveat:  history/ROS limited by acute/critical illness, but  the patient denies chest pain and reports that the shortness of breath began acutely this morning ____________________________________________   PHYSICAL EXAM:  ED Triage Vitals  Enc Vitals Group     BP 04/16/17 0730 135/89     Pulse Rate 04/16/17 0730 (!) 131     Resp 04/16/17 0730 (!) 30     Temp 04/16/17 0740 98.3 F (36.8 C)     Temp Source 04/16/17 0740 Axillary     SpO2 04/16/17 0730 92 %     Weight 04/16/17 0730 68 kg (150 lb)     Height 04/16/17 0730 1.676 m (5' 6" )     Head Circumference --      Peak Flow --      Pain Score --      Pain Loc --      Pain Edu? --      Excl. in [place]? --      Constitutional: Alert but somnolent.  Ill-appearing due to respiratory distress Eyes: Conjunctivae are normal.  Head: Atraumatic. Neck: No stridor.  No meningeal signs.   Cardiovascular: Tachycardia, regular rhythm. Questionable peripheral circulation (skin is cool and clammy).  Respiratory: Increased respiratory effort with intercostal retractions although improved room prioraccording to EMS.  coarse breath sounds throughout lung fields but no audible wheezing Gastrointestinal: Obese. Soft and nontender. No distention.  Musculoskeletal: No lower extremity tenderness nor edema. No gross deformities of extremities. Neurologic:  Normal speech and language. No gross focal neurologic deficits are appreciated.  Skin:  Skin is cool, dry and intact. No rash noted.   ____________________________________________   LABS (all labs ordered are listed, but only abnormal results are displayed)  Labs Reviewed  CBC WITH DIFFERENTIAL/PLATELET - Abnormal; Notable for the following:       Result Value   RDW 14.6 (*)    Lymphs Abs 4.4 (*)    All other components within normal limits  BRAIN NATRIURETIC PEPTIDE - Abnormal; Notable for the following:    B Natriuretic Peptide 1,560.0 (*)    All other components within normal limits  COMPREHENSIVE METABOLIC PANEL - Abnormal; Notable for the  following:    Glucose, Bld 238 (*)    Calcium 8.5 (*)    Total Protein 5.9 (*)    Albumin 2.9 (*)    ALT 13 (*)    All other components within normal limits  BLOOD GAS, ARTERIAL - Abnormal; Notable for the following:    pO2, Arterial 70 (*)    Acid-base deficit 2.9 (*)    Allens test (pass/fail) NOT APPLICABLE (*)    All other  components within normal limits  LACTIC ACID, PLASMA - Abnormal; Notable for the following:    Lactic Acid, Venous 2.1 (*)    All other components within normal limits  LACTIC ACID, PLASMA - Abnormal; Notable for the following:    Lactic Acid, Venous 2.1 (*)    All other components within normal limits  URINALYSIS, ROUTINE W REFLEX MICROSCOPIC - Abnormal; Notable for the following:    Color, Urine AMBER (*)    APPearance TURBID (*)    Hgb urine dipstick SMALL (*)    Protein, ur 100 (*)    Nitrite POSITIVE (*)    Leukocytes, UA LARGE (*)    All other components within normal limits  TROPONIN I - Abnormal; Notable for the following:    Troponin I 0.03 (*)    All other components within normal limits  GLUCOSE, CAPILLARY - Abnormal; Notable for the following:    Glucose-Capillary 135 (*)    All other components within normal limits  MRSA PCR SCREENING  CULTURE, BLOOD (ROUTINE X 2)  CULTURE, BLOOD (ROUTINE X 2)  URINE CULTURE  MAGNESIUM  TROPONIN I  TROPONIN I  TROPONIN I  LACTIC ACID, PLASMA  PROCALCITONIN   ____________________________________________  EKG  ED ECG REPORT I, Bennie Scaff, the attending physician, personally viewed and interpreted this ECG.  Date: 04/16/2017 EKG Time: 07:34 Rate: 128 Rhythm: sinus tachycardia QRS Axis: normal Intervals: LBBB ST/T Wave abnormalities: normal Narrative Interpretation: no evidence of acute ischemia, no significant change from prior  ____________________________________________  RADIOLOGY   Dg Chest Portable 1 View  Result Date: 04/16/2017 CLINICAL DATA:  Pt found unresponsive and in  respiratory failure today. Hx of asthma, brain tumor, CHF, COPD, CAD, diabetes, heart murmur, hypertension, pneumonia. Non smoker EXAM: PORTABLE CHEST 1 VIEW COMPARISON:  03/22/2017 FINDINGS: There is significant dense opacity at the right lung base, likely indicating pleural effusion and consolidation or significant atelectasis, or portion of which is ongoing. There is a small left pleural effusion. The heart is mildly enlarged. There are interstitial changes consistent with mild edema. Mild patchy opacity in the lung bases could represent edema and/or infectious process. Right IJ line has been removed. IMPRESSION: 1. Persistent/significant opacity at the right lung base compatible with pleural effusion and atelectasis or infiltrate. 2. Interstitial pulmonary edema. 3. Bilateral lower lobe patchy of consistent with edema and/or infectious process. Electronically Signed   By: Nolon Nations M.D.   On: 04/16/2017 07:56    ____________________________________________   PROCEDURES  Critical Care performed: Yes, see critical care procedure note(s)   Procedure(s) performed:   .Critical Care Performed by: Hinda Kehr Authorized by: Hinda Kehr   Critical care provider statement:    Critical care time (minutes):  30   Critical care time was exclusive of:  Separately billable procedures and treating other patients   Critical care was necessary to treat or prevent imminent or life-threatening deterioration of the following conditions:  Respiratory failure   Critical care was time spent personally by me on the following activities:  Development of treatment plan with patient or surrogate, discussions with consultants, evaluation of patient's response to treatment, examination of patient, obtaining history from patient or surrogate, ordering and performing treatments and interventions, ordering and review of laboratory studies, ordering and review of radiographic studies, pulse oximetry,  re-evaluation of patient's condition and review of old charts      ____________________________________________   INITIAL IMPRESSION / [place] / ED COURSE  Pertinent labs & imaging results that  were available during my care of the patient were reviewed by me and considered in my medical decision making (see chart for details).  the patient presents in severe respiratory distress.  We transitioned her to BiPAP immediately upon arrival in the emergency department within minutes she was able to tell us that she feels better.  She looks very tired but she is protecting her airway at this time and given her age and comorbidities including chronic COPD, I will try to avoid intubating her given how difficult it will be to wean her and extubate her.  I reviewed her medical records and see that about 3 weeks ago she presented nearly identically and did require intubation and pressors due to what was thought to be septic shock from a pneumonia.  However her current presentation, particularly given the reported sudden onset and with a known low ejection fraction, is most consistent with flash pulmonary edema or at least acute CHF exacerbation.  I will hold off on septic treatment until I had evidence supporting a specific infectious diagnosis.  A broad workup is underway.   Clinical Course as of Apr 16 1154  Fri Apr 16, 2017  0758 ABG reassuring except for the hypoxia.  CBC unremarkable.  Remainder of labs are pending. pO2, Arterial: (!) 70 [CF]  0759 Clinically patient is much improved on CPAP, awake and alert, looking around the room, conversant, no longer somnolent.  [CF]  4097 metabolic panel is unremarkable.  Lactic acid is still pending as well as the BNP.  I reviewed the report of the chest x-ray and I believe, as documented above, that her presentation is consistent with CHF exacerbation and resultant acute respiratory failure with hypoxia.  She is afebrile and has no leukocytosis and  her right sided opacity is stable from prior.  I believe that the patchy opacities the radiologist mentioned are in fact due to pulmonary edema and not pneumonia.  I have ordered Lasix 40 mg IV and discussed the case with the hospitalist who will admit.  I shared my thoughts on the diagnosis with her and if she feels empiric treatment with antibiotics is appropriate she will proceed.  [CF]    Clinical Course User Index [CF] Hinda Kehr, MD    ____________________________________________  FINAL CLINICAL IMPRESSION(S) / ED DIAGNOSES  Final diagnoses:  Acute respiratory failure with hypoxia ([place])  Acute on chronic congestive heart failure, unspecified heart failure type ([place])     MEDICATIONS GIVEN DURING THIS VISIT:  Medications  furosemide (LASIX) injection 20 mg (not administered)  ipratropium-albuterol (DUONEB) 0.5-2.5 (3) MG/3ML nebulizer solution 3 mL (3 mLs Nebulization Given 04/16/17 1028)  feeding supplement (ENSURE ENLIVE) (ENSURE ENLIVE) liquid 237 mL (not administered)  mirtazapine (REMERON) tablet 7.5 mg (not administered)  pantoprazole (PROTONIX) EC tablet 40 mg (not administered)  potassium chloride (K-DUR,KLOR-CON) CR tablet 20 mEq (not administered)  budesonide (PULMICORT) nebulizer solution 0.5 mg (0.5 mg Nebulization Given 04/16/17 1028)  pramipexole (MIRAPEX) tablet 0.25 mg (not administered)  topiramate (TOPAMAX) tablet 25 mg (not administered)  escitalopram (LEXAPRO) tablet 10 mg (not administered)  lamoTRIgine (LAMICTAL) tablet 100 mg (not administered)  pregabalin (LYRICA) capsule 50 mg (not administered)  docusate sodium (COLACE) capsule 100 mg (not administered)  enoxaparin (LOVENOX) injection 40 mg (not administered)  insulin aspart (novoLOG) injection 0-9 Units (not administered)  ipratropium-albuterol (DUONEB) 0.5-2.5 (3) MG/3ML nebulizer solution 9 mL (9 mLs Nebulization Given 04/16/17 0743)  furosemide (LASIX) injection 40 mg (40 mg Intravenous Given 04/16/17  0813)  NEW OUTPATIENT MEDICATIONS STARTED DURING THIS VISIT:  Current Discharge Medication List      Current Discharge Medication List      Current Discharge Medication List       Note:  This document was prepared using Dragon voice recognition software and may include unintentional dictation errors.    Hinda Kehr, MD 04/16/17 [phone]

## 2017-04-16 NOTE — Progress Notes (Signed)
Dr. Mortimer Fries notified of LA increase to 2.6. 250 mL  IVF bolus ordered. Also received verbal order to transfer patient to med surg no telemetry. Orders placed. Wilnette Kales

## 2017-04-16 NOTE — H&P (Signed)
[facility] NAME: Gennesis Hogland    MR#:  [id number]  DATE OF BIRTH:  11/10/1945  DATE OF ADMISSION:  04/16/2017  PRIMARY CARE PHYSICIAN: Lavera Guise, MD   REQUESTING/REFERRING PHYSICIAN: Karma Greaser  CHIEF COMPLAINT:   Chief Complaint  Patient presents with  . Respiratory Distress    HISTORY OF PRESENT ILLNESS: Ronnesha Mester  is a 71 y.o. female with a known history of CHF, COPD, collagen vascular disease, coronary artery disease, diabetes,recurrent admissions for CHF in last few months and pneumonia- and was sent to rehabilitation after last admission and again was short of breath today morning they're so sent to the hospital. While she was noted to be septic with tachycardia, hypoxia and elevated white blood cell count in ER, started on BiPAP with some evidence of pulmonary edema versus infiltrate on chest x-ray and positive urinalysis. Hospitalist service was called in for  PAST MEDICAL HISTORY:   Past Medical History:  Diagnosis Date  . Anginal pain ([place])   . Anxiety   . Arthritis    RA  . Asthma   . Brain tumor (benign) ([place])   . CHF (congestive heart failure) ([place])   . Collagen vascular disease ([place])   . COPD (chronic obstructive pulmonary disease) ([place])   . Coronary artery disease   . DDD (degenerative disc disease)   . Depression   . Diabetes mellitus   . GERD (gastroesophageal reflux disease)   . Headache   . Heart murmur   . Heart murmur   . Hypercholesteremia   . Hypertension   . Lumbar degenerative disc disease   . Migraines   . Obese   . Obesity   . Osteopenia   . Osteoporosis   . Pneumonia   . PTSD (post-traumatic stress disorder)     PAST SURGICAL HISTORY: Past Surgical History:  Procedure Laterality Date  . ABDOMINAL HYSTERECTOMY    . CERVICAL FUSION    . CHOLECYSTECTOMY    . COLONOSCOPY WITH PROPOFOL N/A 09/20/2015   Procedure: COLONOSCOPY WITH PROPOFOL;  Surgeon: Josefine Class, MD;  Location:  [facility] ENDOSCOPY;  Service: Endoscopy;  Laterality: N/A;  . COLONOSCOPY WITH PROPOFOL N/A 01/27/2016   Procedure: COLONOSCOPY WITH PROPOFOL;  Surgeon: Manya Silvas, MD;  Location: [facility] ENDOSCOPY;  Service: Endoscopy;  Laterality: N/A;  . FINGER ARTHROPLASTY  02/23/2012   Procedure: FINGER ARTHROPLASTY;  Surgeon: Cammie Sickle., MD;  Location: [facility];  Service: Orthopedics;  Laterality: Left;  Extensor carpi radialis longus to Extensor carpi ulnaris transfer, left Metaphalangeal reconstructions of index and long fingers,  . FOOT ARTHROPLASTY     toes x2 rt foot  . HAND RECONSTRUCTION  2011   right-multiple finger joint reconst  . JOINT REPLACEMENT     bilat knee replacements    SOCIAL HISTORY:  Social History  Substance Use Topics  . Smoking status: Never Smoker  . Smokeless tobacco: Never Used  . Alcohol use No    FAMILY HISTORY:  Family History  Problem Relation Age of Onset  . Depression Sister   . Hypertension Mother   . Arthritis/Rheumatoid Mother   . Heart attack Father   . Hypertension Father   . CAD Unknown   . Hypertension Unknown   . Diabetes Mellitus II Unknown   . Arthritis Unknown     DRUG ALLERGIES:  Allergies  Allergen Reactions  . Gabapentin Other (See Comments)    Pt states that it  causes her BP to drop.   . Naproxen Hives  . Nsaids Other (See Comments)    Reaction:  Unknown   . Tramadol Itching    REVIEW OF SYSTEMS:   CONSTITUTIONAL: No fever, fatigue or weakness.  EYES: No blurred or double vision.  EARS, NOSE, AND THROAT: No tinnitus or ear pain.  RESPIRATORY: No cough,positive for shortness of breath, wheezing , no hemoptysis.  CARDIOVASCULAR: No chest pain, orthopnea, edema.  GASTROINTESTINAL: No nausea, vomiting, diarrhea or abdominal pain.  GENITOURINARY: No dysuria, hematuria.  ENDOCRINE: No polyuria, nocturia,  HEMATOLOGY: No anemia, easy bruising or bleeding SKIN: No rash or lesion. MUSCULOSKELETAL: No joint  pain or arthritis.   NEUROLOGIC: No tingling, numbness, weakness.  PSYCHIATRY: No anxiety or depression.   MEDICATIONS AT HOME:  Prior to Admission medications   Medication Sig Start Date End Date Taking? Authorizing Provider  budesonide (PULMICORT) 0.5 MG/2ML nebulizer solution Take 2 mLs (0.5 mg total) by nebulization 2 (two) times daily. 03/02/17  Yes Max Sane, MD  diphenoxylate-atropine (LOMOTIL) 2.5-0.025 MG tablet Take 1 tablet by mouth 3 (three) times daily as needed for diarrhea or loose stools.   Yes [provider]  escitalopram (LEXAPRO) 10 MG tablet Take 1 tablet (10 mg total) by mouth every morning. 09/11/16  Yes Rainey Pines, MD  feeding supplement, ENSURE ENLIVE, (ENSURE ENLIVE) LIQD Take 237 mLs by mouth 2 (two) times daily between meals. 03/24/17  Yes Sudini, Alveta Heimlich, MD  furosemide (LASIX) 40 MG tablet Take 0.5 tablets (20 mg total) by mouth daily. 03/25/17  Yes Sudini, Alveta Heimlich, MD  insulin starter kit- pen needles MISC 1 kit by Other route once. 03/09/15  Yes Aldean Jewett, MD  ipratropium-albuterol (DUONEB) 0.5-2.5 (3) MG/3ML SOLN Take 3 mLs by nebulization 2 (two) times daily. 03/04/17  Yes Wieting, Richard, MD  lamoTRIgine (LAMICTAL) 100 MG tablet Take 1 tablet (100 mg total) by mouth daily. 06/02/16  Yes Rainey Pines, MD  metoprolol tartrate (LOPRESSOR) 25 MG tablet Take 0.5 tablets (12.5 mg total) by mouth 2 (two) times daily. 03/24/17  Yes Sudini, Alveta Heimlich, MD  mirtazapine (REMERON) 7.5 MG tablet Take 7.5 mg by mouth at bedtime.    Yes [provider]  omeprazole (PRILOSEC) 20 MG capsule Take 20 mg by mouth at bedtime.   Yes [provider]  ondansetron (ZOFRAN) 4 MG tablet Take 4 mg by mouth every 3 (three) hours as needed for nausea or vomiting.    Yes [provider]  potassium chloride 20 MEQ TBCR Take 20 mEq by mouth daily. 03/24/17  Yes Sudini, Alveta Heimlich, MD  pramipexole (MIRAPEX) 0.25 MG tablet Take 0.25 mg by mouth daily.    Yes  [provider]  pregabalin (LYRICA) 50 MG capsule TAKE ONE CAPSULE BY MOUTH TWICE A DAY 06/18/15  Yes [provider]  saxagliptin HCl (ONGLYZA) 5 MG TABS tablet Take 5 mg by mouth at bedtime.   Yes [provider]  topiramate (TOPAMAX) 25 MG tablet Take 25 mg by mouth 2 (two) times daily.   Yes [provider]      PHYSICAL EXAMINATION:   VITAL SIGNS: Blood pressure 112/63, pulse 86, temperature 97.7 F (36.5 C), temperature source Axillary, resp. rate 20, height 4' 8"  (1.422 m), weight 57.9 kg (127 lb 10.3 oz), SpO2 97 %.  GENERAL:  71 y.o.-year-old patient lying in the bed with no acute distress.  EYES: Pupils equal, round, reactive to light and accommodation. No scleral icterus. Extraocular muscles intact.  HEENT:  Head atraumatic, normocephalic. Oropharynx and nasopharynx clear.  NECK:  Supple, no jugular venous distention. No thyroid enlargement, no tenderness.  LUNGS: Normal breath sounds bilaterally, no wheezing, health bilateral crepitation. positive use of accessory muscles of respiration. On BiPAP. CARDIOVASCULAR: S1, S2 normal. No murmurs, rubs, or gallops.  ABDOMEN: Soft, nontender, nondistended. Bowel sounds present. No organomegaly or mass.  EXTREMITIES: No pedal edema, cyanosis, or clubbing.  NEUROLOGIC: Cranial nerves II through XII are intact. Muscle strength 5/5 in all extremities. Sensation intact. Gait not checked.  PSYCHIATRIC: The patient is alert and oriented x 3.  SKIN: No obvious rash, lesion, or ulcer.   LABORATORY PANEL:   CBC  Recent Labs Lab 04/16/17 0728  WBC 11.0  HGB 14.4  HCT 42.0  PLT 369  MCV 91.1  MCH 31.2  MCHC 34.2  RDW 14.6*  LYMPHSABS 4.4*  MONOABS 0.7  EOSABS 0.5  BASOSABS 0.1   ------------------------------------------------------------------------------------------------------------------  Chemistries   Recent Labs Lab 04/16/17 0728  NA 138  K 3.5  CL 107  CO2 23  GLUCOSE 238*  BUN  9  CREATININE 0.61  CALCIUM 8.5*  MG 1.9  AST 27  ALT 13*  ALKPHOS 103  BILITOT 0.8   ------------------------------------------------------------------------------------------------------------------ estimated creatinine clearance is 45.7 mL/min (by C-G formula based on SCr of 0.61 mg/dL). ------------------------------------------------------------------------------------------------------------------ No results for input(s): TSH, T4TOTAL, T3FREE, THYROIDAB in the last 72 hours.  Invalid input(s): FREET3   Coagulation profile No results for input(s): INR, PROTIME in the last 168 hours. ------------------------------------------------------------------------------------------------------------------- No results for input(s): DDIMER in the last 72 hours. -------------------------------------------------------------------------------------------------------------------  Cardiac Enzymes  Recent Labs Lab 04/16/17 0728 04/16/17 1033 04/16/17 1301  TROPONINI <0.03 0.03* 0.04*   ------------------------------------------------------------------------------------------------------------------ Invalid input(s): POCBNP  ---------------------------------------------------------------------------------------------------------------  Urinalysis    Component Value Date/Time   COLORURINE AMBER (A) 04/16/2017 0748   APPEARANCEUR TURBID (A) 04/16/2017 0748   APPEARANCEUR Turbid (A) 05/05/2016 1010   LABSPEC 1.014 04/16/2017 0748   PHURINE 6.0 04/16/2017 0748   GLUCOSEU NEGATIVE 04/16/2017 0748   HGBUR SMALL (A) 04/16/2017 0748   BILIRUBINUR NEGATIVE 04/16/2017 0748   BILIRUBINUR Negative 05/05/2016 1010   KETONESUR NEGATIVE 04/16/2017 0748   PROTEINUR 100 (A) 04/16/2017 0748   NITRITE POSITIVE (A) 04/16/2017 0748   LEUKOCYTESUR LARGE (A) 04/16/2017 0748   LEUKOCYTESUR 1+ (A) 05/05/2016 1010     RADIOLOGY: Dg Chest Portable 1 View  Result Date: 04/16/2017 CLINICAL DATA:  Pt  found unresponsive and in respiratory failure today. Hx of asthma, brain tumor, CHF, COPD, CAD, diabetes, heart murmur, hypertension, pneumonia. Non smoker EXAM: PORTABLE CHEST 1 VIEW COMPARISON:  03/22/2017 FINDINGS: There is significant dense opacity at the right lung base, likely indicating pleural effusion and consolidation or significant atelectasis, or portion of which is ongoing. There is a small left pleural effusion. The heart is mildly enlarged. There are interstitial changes consistent with mild edema. Mild patchy opacity in the lung bases could represent edema and/or infectious process. Right IJ line has been removed. IMPRESSION: 1. Persistent/significant opacity at the right lung base compatible with pleural effusion and atelectasis or infiltrate. 2. Interstitial pulmonary edema. 3. Bilateral lower lobe patchy of consistent with edema and/or infectious process. Electronically Signed   By: Nolon Nations M.D.   On: 04/16/2017 07:56    EKG: Orders placed or performed during the hospital encounter of 04/16/17  . ED EKG  . ED EKG  . EKG 12-Lead  . EKG 12-Lead    IMPRESSION AND PLAN:  * sepsis secondary to UTI  and likely pneumonia      Due to CHF exacerbation cannot give IV fluid, monitor with BiPAP use in stepdown unit.   IV antibiotic and cultures are sent.  * acute respiratory failure with hypoxia   Secondary to acute on chronic systolic congestive heart failure     IV Lasix, intake and output measurement, fluid restrictions.   Cardiology consult and pulmonary consult to help with BiPAP.   Follow serial troponin.  * lactic acidosis   Due to sepsis, monitor as we will have to give IV Lasix.  * COPD   Continue nebulizer and Spiriva,no need to give steroid at this point is no wheezing.     All the records are reviewed and case discussed with ED provider. Management plans discussed with the patient, family and they are in agreement.  CODE STATUS: DNR    Code Status  Orders        Start     Ordered   04/16/17 1016  Do not attempt resuscitation (DNR)  Continuous    Question Answer Comment  In the event of cardiac or respiratory ARREST Do not call a "code blue"   In the event of cardiac or respiratory ARREST Do not perform Intubation, CPR, defibrillation or ACLS   In the event of cardiac or respiratory ARREST Use medication by any route, position, wound care, and other measures to relive pain and suffering. May use oxygen, suction and manual treatment of airway obstruction as needed for comfort.      04/16/17 1015    Code Status History    Date Active Date Inactive Code Status Order ID Comments User Context   03/20/2017  4:05 AM 03/24/2017  8:08 PM Full Code [id number]  Lance Coon, MD Inpatient   02/27/2017  5:39 AM 03/04/2017  6:14 PM Full Code [id number]  Saundra Shelling, MD Inpatient   02/22/2017  3:47 PM 02/26/2017 11:05 PM Full Code [id number]  Vaughan Basta, MD Inpatient   03/01/2015  9:12 PM 03/10/2015  8:17 PM Full Code [id number]  Demetrios Loll, MD Inpatient   12/24/2014  5:27 AM 12/25/2014  6:32 PM DNR [id number]  Lance Coon, MD Inpatient       TOTAL TIME TAKING CARE OF THIS PATIENT: 50 critical care minutes.    Vaughan Basta M.D on 04/16/2017   Between 7am to 6pm - Pager - [phone]  After 6pm go to [email or web address] - password [email or web address] [facility]  Office  [phone]  CC: Primary care physician; Lavera Guise, MD   Note: This dictation was prepared with Dragon dictation along with smaller phrase technology. Any transcriptional errors that result from this process are unintentional.

## 2017-04-16 NOTE — Progress Notes (Signed)
Hx of CHF EF 25%, COPD, C diff, recurent admissions Sent to rehab after last discharge. SOB today. Noted ?septic, hypoxic, needing bipap. Have positive UA, Edema Vs infiltrate on Xray.  71 year old female with acute restaurant distress most likely from acute on chronic CHF in the setting of UTI less likely pneumonia Sepsis- UTI with Lactic acidosis  Bipap, IV lasix, Monitor troponin, BMP Hold Metoprolol.  Pt is DNR.  Plan to wean off BiPAP and assess resp needs and oxygen needs    Corrin Parker, M.D.  [facility]  Medical Director [place] Director [facility] Cardio-Pulmonary Department

## 2017-04-16 NOTE — ED Notes (Signed)
Patient remains A+Ox4. Easily rousable

## 2017-04-16 NOTE — Clinical Social Work Note (Signed)
Clinical Social Work Assessment  Patient Details  Name: Meghan [name] MRN: [id number] Date of Birth: 18-Jul-1946  Date of referral:  04/16/17               Reason for consult:  Other (Comment Required) (From [facility])                Permission sought to share information with:  Family Supports, Chartered certified accountant granted to share information::  Yes, Verbal Permission Granted  Name::        Agency::     Relationship::     Contact Information:     Housing/Transportation Living arrangements for the past 2 months:  [place] of Information:  Patient, Adult Children Patient Interpreter Needed:  None Criminal Activity/Legal Involvement Pertinent to Current Situation/Hospitalization:  No - Comment as needed Significant Relationships:  Adult Children, Other Family Members Lives with:  Facility Resident Do you feel safe going back to the place where you live?  Yes Need for family participation in patient care:  Yes (Comment)  Care giving concerns:  None at this time Meghan [name] lamb granddaughter will notify Meghan [name] and other family members   Social Worker assessment / plan: LCSW introduced myself to patient and obtained verbal consent to complete assessment and to contact her [place] daughters Meghan [name] [phone]. Patient was on a bi pap machine going up to ICU so assessment was completed with the help of her granddaughter Meghan [name]. Patient is 71 year old female who is currently at [facility] for STR as she was weak and unable to walk, according to family she has been there for 2 months and is now just able to to start walking with walker. Family reports she will return to [facility] once she discharges from hospital and once she completes her STR at [facility] she will return home. Her other grandaughter Meghan [name] lives in the house behind her and both girls support pt to and from doctor appointments,shopping and in home assistance. Patient is diabetic and  uses insulin,she weras glasses and hearing is good and speech is good. Family reports she has had some memory issues and confusion at times. Patient is on low sodium/diabetic diet. Meghan [name] daughters would like assistance with HCPOA documents later this weekend. Patient is insured with UHC advantage, Medicaid. LCSW will complete assessment and Fl2 with passr number and report on handoff. No further needs.  Employment status:  Retired Forensic scientist:  [place]icaid [place], [employer] ([place] [facility]) PT Recommendations:  Not assessed at this time Information / Referral to community resources:   None required at this time  Patient/Family's Response to care: patient was polite and calm and wanted this worker to call her grand daughter./Grandaughter reported pt has to be in ICU before for breathing issues. She resides at [facility] for STR PT x 5week. Once discharged she is to return [facility] to complete her rehab  Patient/Family's Understanding of and Emotional Response to Diagnosis, Current Treatment, and Prognosis:  Patient and grand daughter have good understanding  Emotional Assessment Appearance:  Appears stated age Attitude/Demeanor/Rapport:   (Kind and cooperative) Affect (typically observed):  Accepting, Appropriate, Calm Orientation:  Oriented to Self, Oriented to Place, Oriented to  Time, Oriented to Situation Alcohol / Substance use:  Not Applicable Psych involvement (Current and /or in the community):  No (Comment)  Discharge Needs  Concerns to be addressed:  No discharge needs identified Readmission within the last 30 days:  No Current discharge risk:  None Barriers to Discharge:  Continued Medical Work up   Meghan Reamer, LCSW 04/16/2017, 8:49 AM

## 2017-04-16 NOTE — Progress Notes (Signed)
Report given to [facility], RN on 1C. Patient to transfer with no telemetry. CCMD and Elink notified of transfer. Wilnette Kales

## 2017-04-17 ENCOUNTER — Inpatient Hospital Stay: Payer: Medicare Other

## 2017-04-17 LAB — GLUCOSE, CAPILLARY
GLUCOSE-CAPILLARY: 103 mg/dL — AB (ref 65–99)
Glucose-Capillary: 145 mg/dL — ABNORMAL HIGH (ref 65–99)
Glucose-Capillary: 133 mg/dL — ABNORMAL HIGH (ref 65–99)
Glucose-Capillary: 95 mg/dL (ref 65–99)

## 2017-04-17 LAB — CBC
HEMATOCRIT: 33.3 % — AB (ref 35.0–47.0)
Hemoglobin: 11.6 g/dL — ABNORMAL LOW (ref 12.0–16.0)
MCH: 31 pg (ref 26.0–34.0)
MCHC: 34.7 g/dL (ref 32.0–36.0)
MCV: 89.3 fL (ref 80.0–100.0)
PLATELETS: 206 10*3/uL (ref 150–440)
RBC: 3.73 MIL/uL — ABNORMAL LOW (ref 3.80–5.20)
RDW: 14.5 % (ref 11.5–14.5)
WBC: 4.9 10*3/uL (ref 3.6–11.0)

## 2017-04-17 LAB — BASIC METABOLIC PANEL
Anion gap: 7 (ref 5–15)
BUN: 10 mg/dL (ref 6–20)
CHLORIDE: 106 mmol/L (ref 101–111)
CO2: 26 mmol/L (ref 22–32)
CREATININE: 0.46 mg/dL (ref 0.44–1.00)
Calcium: 8 mg/dL — ABNORMAL LOW (ref 8.9–10.3)
GFR calc Af Amer: >60 mL/min (ref >60)
GFR calc non Af Amer: >60 mL/min (ref >60)
GLUCOSE: 87 mg/dL (ref 65–99)
POTASSIUM: 3.1 mmol/L — AB (ref 3.5–5.1)
Sodium: 139 mmol/L (ref 135–145)

## 2017-04-17 LAB — PROCALCITONIN: Procalcitonin: 0.15 ng/mL

## 2017-04-17 LAB — MAGNESIUM: Magnesium: 1.6 mg/dL — ABNORMAL LOW (ref 1.7–2.4)

## 2017-04-17 LAB — LACTIC ACID, PLASMA: Lactic Acid, Venous: 2 mmol/L (ref 0.5–1.9)

## 2017-04-17 MED ORDER — LISINOPRIL 5 MG PO TABS: 2.5000 mg | ORAL_TABLET | Freq: Every day | ORAL | Status: DC

## 2017-04-17 MED ORDER — CARVEDILOL 3.125 MG PO TABS
3.1250 mg | ORAL_TABLET | Freq: Two times a day (BID) | ORAL | Status: DC
  Administered 2017-04-17 – 2017-04-19 (×4): 3.125 mg via ORAL
  Filled 2017-04-17 (×4): qty 1

## 2017-04-17 MED ORDER — SODIUM CHLORIDE 0.9 % IV SOLN
Freq: Once | INTRAVENOUS | Status: AC
  Administered 2017-04-17: 18:00:00 via INTRAVENOUS
  Filled 2017-04-17: qty 50

## 2017-04-17 MED ORDER — DEXTROSE 5 % IV SOLN
1.0000 g | INTRAVENOUS | Status: DC
  Administered 2017-04-17 – 2017-04-18 (×2): 1 g via INTRAVENOUS
  Filled 2017-04-17 (×2): qty 10

## 2017-04-17 MED ORDER — FUROSEMIDE 10 MG/ML IJ SOLN
20.0000 mg | Freq: Two times a day (BID) | INTRAMUSCULAR | Status: DC
  Filled 2017-04-17: qty 2

## 2017-04-17 MED ORDER — POTASSIUM CHLORIDE CRYS ER 20 MEQ PO TBCR
40.0000 meq | EXTENDED_RELEASE_TABLET | Freq: Once | ORAL | Status: AC
  Administered 2017-04-17: 40 meq via ORAL
  Filled 2017-04-17: qty 2

## 2017-04-17 MED ORDER — MAGNESIUM SULFATE 2 GM/50ML IV SOLN: 2.0000 g | Freq: Once | INTRAVENOUS | Status: DC

## 2017-04-17 NOTE — Progress Notes (Signed)
BP 91/43 Lasix IV 20mg  ordered scheduled. Dr. Jannifer Franklin notified. Order to hold this dose of scheduled Lasix obtained.

## 2017-04-17 NOTE — Progress Notes (Signed)
[facility] [place] NAME: Meghan [name]    MR#:  [id number]  DATE OF BIRTH:  Jul 10, 1946  SUBJECTIVE:  CHIEF COMPLAINT:   Chief Complaint  Patient presents with  . Respiratory Distress   Better shortness of breath, oxygen [place] 3L. REVIEW OF SYSTEMS:  Review of Systems  Constitutional: Negative for chills, fever and malaise/fatigue.  HENT: Negative for sore throat.   Eyes: Negative for blurred vision and double vision.  Respiratory: Positive for cough and shortness of breath. Negative for hemoptysis, wheezing and stridor.   Cardiovascular: Negative for chest pain, palpitations, orthopnea and leg swelling.  Gastrointestinal: Negative for abdominal pain, blood in stool, diarrhea, melena, nausea and vomiting.  Genitourinary: Negative for dysuria, flank pain and hematuria.  Musculoskeletal: Negative for back pain and joint pain.  Neurological: Negative for dizziness, sensory change, focal weakness, seizures, loss of consciousness, weakness and headaches.  Endo/Heme/Allergies: Negative for polydipsia.  Psychiatric/Behavioral: Negative for depression. The patient is not nervous/anxious.     DRUG ALLERGIES:   Allergies  Allergen Reactions  . Gabapentin Other (See Comments)    Pt states that it causes her BP to drop.   . Naproxen Hives  . Nsaids Other (See Comments)    Reaction:  Unknown   . Tramadol Itching   VITALS:  Blood pressure (!) 100/50, pulse (!) 116, temperature 98.1 F (36.7 C), temperature source Axillary, resp. rate (!) 22, height 4\' 8"  (1.422 m), weight 124 lb 8 oz (56.5 kg), SpO2 99 %. PHYSICAL EXAMINATION:  Physical Exam  Constitutional: She is oriented to person, place, and time and well-developed, well-nourished, and in no distress.  HENT:  Head: Normocephalic.  Mouth/Throat: Oropharynx is clear and moist.  Eyes: Pupils are equal, round, and reactive to light. Conjunctivae and EOM are normal. No scleral icterus.    Neck: Normal range of motion. Neck supple. No JVD present. No tracheal deviation present.  Cardiovascular: Normal rate, regular rhythm and normal heart sounds.  Exam reveals no gallop.   No murmur heard. Pulmonary/Chest: Effort normal. No respiratory distress. She has no wheezes. She has rales.  Abdominal: Soft. Bowel sounds are normal. She exhibits no distension. There is no tenderness. There is no rebound.  Musculoskeletal: Normal range of motion. She exhibits no edema or tenderness.  Neurological: She is alert and oriented to person, place, and time. No cranial nerve deficit.  Skin: No rash noted. No erythema.  Psychiatric: Affect normal.   LABORATORY PANEL:  Female CBC  Recent Labs Lab 04/17/17 0404  WBC 4.9  HGB 11.6*  HCT 33.3*  PLT 206   ------------------------------------------------------------------------------------------------------------------ Chemistries   Recent Labs Lab 04/16/17 0728 04/17/17 0404  NA 138 139  K 3.5 3.1*  CL 107 106  CO2 23 26  GLUCOSE 238* 87  BUN 9 10  CREATININE 0.61 0.46  CALCIUM 8.5* 8.0*  MG 1.9  --   AST 27  --   ALT 13*  --   ALKPHOS 103  --   BILITOT 0.8  --    RADIOLOGY:  Dg Chest 2 View  Result Date: 04/17/2017 CLINICAL DATA:  CHF EXAM: CHEST  2 VIEW COMPARISON:  04/16/2017 FINDINGS: Right lower lobe consolidation unchanged. Improvement in right upper lobe airspace disease. Improved aeration in the left lung base with improvement in left lower lobe atelectasis/infiltrate and small left effusion. Negative for edema. IMPRESSION: Interval improvement in right upper lobe and left lower lobe airspace disease. No change in extensive right  lower lobe consolidation and effusion. Electronically Signed   By: Franchot Gallo M.D.   On: 04/17/2017 08:56   ASSESSMENT AND PLAN:   * sepsis secondary to UTI and likely pneumonia   continue  Rocephin IV and follow-up cultures. U/C: >=100,000 COLONIES/mL GRAM NEGATIVE RODS.   * acute  respiratory failure with hypoxia   Secondary to acute on chronic systolic congestive heart failure EF 25%. off BiPAP.  Try to wean off oxygen . NEB. continue IV Lasix, intake and output measurement, fluid restrictions. Add lisinopril and coreg if BP allows.  Elevated troponin due to demanding ischemia.  * lactic acidosis  follow-up lactic acid level.  Hypokalemia. Give potassium supplement, follow-up level and magnesium.  * COPD   Continue nebulizer and Spiriva, no need to give steroid at this point is no wheezing.  All the records are reviewed and case discussed with Care Management/Social Worker. Management plans discussed with the patient, family and they are in agreement.  CODE STATUS: DNR  TOTAL TIME TAKING CARE OF THIS PATIENT: 37 minutes.   More than 50% of the time was spent in counseling/coordination of care: YES  POSSIBLE D/C IN 2 DAYS, DEPENDING ON CLINICAL CONDITION.   Demetrios Loll M.D on 04/17/2017 at 2:04 PM  Between 7am to 6pm - Pager - [phone]  After 6pm go to [email or web address] - [place] [facility]

## 2017-04-17 NOTE — Consult Note (Signed)
[place]  CARDIOLOGY CONSULT NOTE  Patient ID: Meghan [name] MRN: [id number]  DOB/AGE: Nov 27, 1945 71 y.o.  Admit date: 04/16/2017 Referring Physician Dr. Bridgett Larsson   Primary Physician   Primary Cardiologist Dr. Clayborn Bigness Reason for Consultation chf   HPI: 71 yo female with history of chf, copd, cad,dm, with frequent admissions with sob and reported chf who was readmitted due to sob. She was noted ot be septic with hyoxia, elevated sbc with cxr showing opacity in the right lung base and possible mild pulmonary edema. She has improved with oxygen, antibiotics and diuresis. She is currently hemodynamically stable.   Review of Systems  Constitutional: Positive for malaise/fatigue.  HENT: Negative.   Eyes: Negative.   Respiratory: Positive for shortness of breath.   Cardiovascular: Negative.   Gastrointestinal: Negative.   Genitourinary: Negative.   Musculoskeletal: Negative.   Skin: Negative.   Neurological: Negative.   Endo/Heme/Allergies: Negative.   Psychiatric/Behavioral: Negative.     Past Medical History:  Diagnosis Date  . Anginal pain ([place])   . Anxiety   . Arthritis    RA  . Asthma   . Brain tumor (benign) ([place])   . CHF (congestive heart failure) ([place])   . Collagen vascular disease ([employer])   . COPD (chronic obstructive pulmonary disease) ([place])   . Coronary artery disease   . DDD (degenerative disc disease)   . Depression   . Diabetes mellitus   . GERD (gastroesophageal reflux disease)   . Headache   . Heart murmur   . Heart murmur   . Hypercholesteremia   . Hypertension   . Lumbar degenerative disc disease   . Migraines   . Obese   . Obesity   . Osteopenia   . Osteoporosis   . Pneumonia   . PTSD (post-traumatic stress disorder)     Family History  Problem Relation Age of Onset  . Depression Sister   . Hypertension Mother   . Arthritis/Rheumatoid Mother   . Heart attack Father   . Hypertension Father   .  CAD Unknown   . Hypertension Unknown   . Diabetes Mellitus II Unknown   . Arthritis Unknown     Social History   Social History  . Marital status: Divorced    Spouse name: N/A  . Number of children: N/A  . Years of education: N/A   Occupational History  . retired    Social History Main Topics  . Smoking status: Never Smoker  . Smokeless tobacco: Never Used  . Alcohol use No  . Drug use: No  . Sexual activity: No   Other Topics Concern  . Not on file   Social History Narrative  . No narrative on file    Past Surgical History:  Procedure Laterality Date  . ABDOMINAL HYSTERECTOMY    . CERVICAL FUSION    . CHOLECYSTECTOMY    . COLONOSCOPY WITH PROPOFOL N/A 09/20/2015   Procedure: COLONOSCOPY WITH PROPOFOL;  Surgeon: Josefine Class, MD;  Location: [facility] ENDOSCOPY;  Service: Endoscopy;  Laterality: N/A;  . COLONOSCOPY WITH PROPOFOL N/A 01/27/2016   Procedure: COLONOSCOPY WITH PROPOFOL;  Surgeon: Manya Silvas, MD;  Location: [facility] ENDOSCOPY;  Service: Endoscopy;  Laterality: N/A;  . FINGER ARTHROPLASTY  02/23/2012   Procedure: FINGER ARTHROPLASTY;  Surgeon: Cammie Sickle., MD;  Location: [place];  Service: Orthopedics;  Laterality: Left;  Extensor carpi radialis longus to Extensor carpi ulnaris transfer, left  Metaphalangeal reconstructions of index and long fingers,  . FOOT ARTHROPLASTY     toes x2 rt foot  . HAND RECONSTRUCTION  2011   right-multiple finger joint reconst  . JOINT REPLACEMENT     bilat knee replacements     Prescriptions Prior to Admission  Medication Sig Dispense Refill Last Dose  . budesonide (PULMICORT) 0.5 MG/2ML nebulizer solution Take 2 mLs (0.5 mg total) by nebulization 2 (two) times daily. 2 mL 12 04/15/2017 at 2000  . diphenoxylate-atropine (LOMOTIL) 2.5-0.025 MG tablet Take 1 tablet by mouth 3 (three) times daily as needed for diarrhea or loose stools.   PRN at PRN  . escitalopram (LEXAPRO) 10 MG tablet Take 1 tablet (10  mg total) by mouth every morning. 90 tablet 1 04/15/2017 at 0800  . feeding supplement, ENSURE ENLIVE, (ENSURE ENLIVE) LIQD Take 237 mLs by mouth 2 (two) times daily between meals. 237 mL 12   . furosemide (LASIX) 40 MG tablet Take 0.5 tablets (20 mg total) by mouth daily. 30 tablet 0 04/15/2017 at 0800  . insulin starter kit- pen needles MISC 1 kit by Other route once. 1 kit 0 02/22/2017 at Unknown time  . ipratropium-albuterol (DUONEB) 0.5-2.5 (3) MG/3ML SOLN Take 3 mLs by nebulization 2 (two) times daily. 360 mL 0 PRN at PRN  . lamoTRIgine (LAMICTAL) 100 MG tablet Take 1 tablet (100 mg total) by mouth daily. 90 tablet 3 04/15/2017 at 2000  . metoprolol tartrate (LOPRESSOR) 25 MG tablet Take 0.5 tablets (12.5 mg total) by mouth 2 (two) times daily. 60 tablet  04/15/2017 at 2000  . mirtazapine (REMERON) 7.5 MG tablet Take 7.5 mg by mouth at bedtime.    04/15/2017 at 2000  . omeprazole (PRILOSEC) 20 MG capsule Take 20 mg by mouth at bedtime.   04/15/2017 at 2000  . ondansetron (ZOFRAN) 4 MG tablet Take 4 mg by mouth every 3 (three) hours as needed for nausea or vomiting.    PRN at PRN  . potassium chloride 20 MEQ TBCR Take 20 mEq by mouth daily. 30 tablet 0 04/15/2017 at 0800  . pramipexole (MIRAPEX) 0.25 MG tablet Take 0.25 mg by mouth daily.    04/15/2017 at 0800  . pregabalin (LYRICA) 50 MG capsule TAKE ONE CAPSULE BY MOUTH TWICE A DAY   04/15/2017 at 2000  . saxagliptin HCl (ONGLYZA) 5 MG TABS tablet Take 5 mg by mouth at bedtime.   04/15/2017 at 2000  . topiramate (TOPAMAX) 25 MG tablet Take 25 mg by mouth 2 (two) times daily.   04/15/2017 at 2000    Physical Exam: Blood pressure (!) 100/50, pulse (!) 116, temperature 98.1 F (36.7 C), temperature source Axillary, resp. rate (!) 22, height 4' 8"  (1.422 m), weight 56.5 kg (124 lb 8 oz), SpO2 99 %.   Wt Readings from Last 1 Encounters:  04/17/17 56.5 kg (124 lb 8 oz)     General appearance: alert and cooperative Head: Normocephalic, without obvious  abnormality, atraumatic Resp: rhonchi bilaterally Cardio: regular rate and rhythm GI: soft, non-tender; bowel sounds normal; no masses,  no organomegaly Extremities: extremities normal, atraumatic, no cyanosis or edema Neurologic: Grossly normal  Labs:   Lab Results  Component Value Date   WBC 4.9 04/17/2017   HGB 11.6 (L) 04/17/2017   HCT 33.3 (L) 04/17/2017   MCV 89.3 04/17/2017   PLT 206 04/17/2017    Recent Labs Lab 04/16/17 0728 04/17/17 0404  NA 138 139  K 3.5 3.1*  CL 107 106  CO2 23 26  BUN 9 10  CREATININE 0.61 0.46  CALCIUM 8.5* 8.0*  PROT 5.9*  --   BILITOT 0.8  --   ALKPHOS 103  --   ALT 13*  --   AST 27  --   GLUCOSE 238* 87   Lab Results  Component Value Date   CKTOTAL 95 11/29/2012   CKMB 2.6 11/29/2012   TROPONINI 0.04 ([place]) 04/16/2017        ASSESSMENT AND PLAN:  Pt with history of cardiomyopathy with ef of 20-25% chronically and by recent echo last month who is readmitted with increased sob and noted to have probable volume overload. She is appartently compliant with her meds. She states she is compliant with her low sodium diet.  She takes clonidine, imdur as outpatient. Will try changing to low dose carvedilol and afterload reduction. Will add carvedilol initially and if tolerated consider entresto or ace I. Low salt diet.  Signed: Teodoro Spray MD, [facility] 04/17/2017, 2:00 PM

## 2017-04-17 NOTE — Evaluation (Addendum)
Physical Therapy Evaluation Patient Details Name: Meghan [name] MRN: [id number] DOB: 1946/07/30 Today's Date: 04/17/2017   History of Present Illness  Carmalita Wakefield  is a 71 y.o. female with a known history of CHF, COPD, collagen vascular disease, coronary artery disease, diabetes, recurrent admissions for CHF in last few months and pneumonia and was sent to rehabilitation after last admission and again was short of breath so sent to the hospital. She was noted to be septic with tachycardia, hypoxia and elevated white blood cell count in ER, started on BiPAP with some evidence of pulmonary edema versus infiltrate on chest x-ray and positive urinalysis. Hospitalist service was called for admission. She is currently admitted for sepsis secondary to UTI and likely PNA, acute respiratory failure with EF 25%, and elevated troponin secondary to demand ischemia.   Clinical Impression  Pt admitted with above diagnosis. Pt currently with functional limitations due to the deficits listed below (see PT Problem List).  Pt is unsteady with transfers and ambulation requiring assist for balance. She ambulates with room with therapist with short, shuffling steps. SaO2 at rest on room air is 93% and HR is 119. HR increases to 130 bpm with ambulation and SaO2 is 95% on room air with exertion. No supplemental O2 required with exertion at this time. Pt denies fatigue with ambulation. Pt with poor safety awareness and during turns she falls backwards requiring assist from therapist to prevent her from falling. She is unsafe to return home alone at this time and would benefit from returning to SNF to complete her therapy. Pt will benefit from PT services to address deficits in strength, balance, and mobility in order to return to full function at home.     Follow Up Recommendations SNF (Recommend return to SNF at discharge)    Equipment Recommendations  None recommended by PT    Recommendations for Other Services        Precautions / Restrictions Precautions Precautions: Fall Restrictions Weight Bearing Restrictions: No      Mobility  Bed Mobility Overal bed mobility: Needs Assistance Bed Mobility: Supine to Sit     Supine to sit: Min assist     General bed mobility comments: Pt requires assist to pull up due to poor abdominal strength. HOB eelvated and use of bed rails  Transfers Overall transfer level: Needs assistance Equipment used: Rolling walker (2 wheeled) Transfers: Sit to/from Stand Sit to Stand: Min assist         General transfer comment: Pt requires minA+1 for sit to stand due to poor balance with posterior leaning. Once she is steadied she is able to remain upright with CGA only and UE support on rolling walker  Ambulation/Gait Ambulation/Gait assistance: Min assist Ambulation Distance (Feet): 20 Feet Assistive device: Rolling walker (2 wheeled) Gait Pattern/deviations: Decreased step length - right;Decreased step length - left Gait velocity: Decreased Gait velocity interpretation: <1.8 ft/sec, indicative of risk for recurrent falls General Gait Details: Pt ambulates with room with therapist with short, shuffling steps. SaO2 at rest on room air is 93% and HR is 119. HR increases to 130 bpm with ambulation and SaO2 is 95% on room air with exertion. No supplemental O2 required with exertion at this time. Pt denies fatigue with ambulation. Pt with poor safety awareness and during turns she falls backwards requiring assist from therapist to prevent her from falling.  Stairs            Wheelchair Mobility    Modified Rankin (Stroke Patients  Only)       Balance Overall balance assessment: Needs assistance Sitting-balance support: No upper extremity supported Sitting balance-Leahy Scale: Fair     Standing balance support: No upper extremity supported Standing balance-Leahy Scale: Fair Standing balance comment: Able to maintain wide stance without UE support after  increased time in standing. Unable to place feet together without losing balance                             Pertinent Vitals/Pain Pain Assessment: No/denies pain    Home Living Family/patient expects to be discharged to:: Skilled nursing facility Living Arrangements: Alone Available Help at Discharge: Neighbor;Available PRN/intermittently Type of Home: House Home Access: Stairs to enter Entrance Stairs-Rails: Can reach both;Right;Left Entrance Stairs-Number of Steps: 2 Home Layout: One level Home Equipment: Walker - 2 wheels;Shower seat;Grab bars - tub/shower      Prior Function Level of Independence: Independent with assistive device(s)         Comments: Pt independent with amb without AD community distances, no fall history, Independent with all ADLs/IADLs     Hand Dominance   Dominant Hand: Right    Extremity/Trunk Assessment   Upper Extremity Assessment Upper Extremity Assessment: Overall WFL for tasks assessed    Lower Extremity Assessment Lower Extremity Assessment: Generalized weakness       Communication   Communication: No difficulties  Cognition Arousal/Alertness: Awake/alert Behavior During Therapy: WFL for tasks assessed/performed Overall Cognitive Status: No family/caregiver present to determine baseline cognitive functioning                                 General Comments: Pt is AOx2, oriented to person and time but disoriented to place. She is partially oriented to situation.       General Comments      Exercises     Assessment/Plan    PT Assessment Patient needs continued PT services  PT Problem List Decreased strength;Decreased activity tolerance;Decreased balance;Decreased mobility;Decreased knowledge of use of DME;Decreased safety awareness       PT Treatment Interventions DME instruction;Gait training;Stair training;Therapeutic activities;Therapeutic exercise;Balance training;Neuromuscular  re-education;Patient/family education    PT Goals (Current goals can be found in the Care Plan section)  Acute Rehab PT Goals Patient Stated Goal: Return to prior function and be safe at home PT Goal Formulation: With patient Time For Goal Achievement: 05/01/17 Potential to Achieve Goals: Fair    Frequency Min 2X/week   Barriers to discharge Decreased caregiver support Lives alone    Co-evaluation               AM-PAC PT "6 Clicks" Daily Activity  Outcome Measure Difficulty turning over in bed (including adjusting bedclothes, sheets and blankets)?: Unable Difficulty moving from lying on back to sitting on the side of the bed? : Unable Difficulty sitting down on and standing up from a chair with arms (e.g., wheelchair, bedside commode, etc,.)?: Unable Help needed moving to and from a bed to chair (including a wheelchair)?: A Little Help needed walking in hospital room?: A Little Help needed climbing 3-5 steps with a railing? : A Lot 6 Click Score: 11    End of Session Equipment Utilized During Treatment: Gait belt Activity Tolerance: Patient tolerated treatment well Patient left: in chair;with call bell/phone within reach;with chair alarm set Nurse Communication: Mobility status PT Visit Diagnosis: Unsteadiness on feet (R26.81);Muscle weakness (generalized) (M62.81)  Time: 4327-6147 PT Time Calculation (min) (ACUTE ONLY): 21 min   Charges:   PT Evaluation $PT Eval Low Complexity: 1 Low     PT G Codes:   PT G-Codes **NOT FOR INPATIENT CLASS** Functional Assessment Tool Used: AM-PAC 6 Clicks Basic Mobility Functional Limitation: Mobility: Walking and moving around Mobility: Walking and Moving Around Current Status (W9295): At least 60 percent but less than 80 percent impaired, limited or restricted Mobility: Walking and Moving Around Goal Status [phone]): At least 20 percent but less than 40 percent impaired, limited or restricted    Phillips Grout PT, DPT     Annalyn Blecher 04/17/2017, 4:47 PM

## 2017-04-18 LAB — BASIC METABOLIC PANEL
ANION GAP: 6 (ref 5–15)
BUN: 11 mg/dL (ref 6–20)
CO2: 27 mmol/L (ref 22–32)
Calcium: 8.1 mg/dL — ABNORMAL LOW (ref 8.9–10.3)
Chloride: 104 mmol/L (ref 101–111)
Creatinine, Ser: 0.38 mg/dL — ABNORMAL LOW (ref 0.44–1.00)
GFR calc Af Amer: >60 mL/min (ref >60)
GLUCOSE: 93 mg/dL (ref 65–99)
POTASSIUM: 3.5 mmol/L (ref 3.5–5.1)
Sodium: 137 mmol/L (ref 135–145)

## 2017-04-18 LAB — PROCALCITONIN: PROCALCITONIN: 0.1 ng/mL

## 2017-04-18 LAB — GLUCOSE, CAPILLARY
GLUCOSE-CAPILLARY: 115 mg/dL — AB (ref 65–99)
Glucose-Capillary: 91 mg/dL (ref 65–99)
Glucose-Capillary: 124 mg/dL — ABNORMAL HIGH (ref 65–99)
Glucose-Capillary: 124 mg/dL — ABNORMAL HIGH (ref 65–99)

## 2017-04-18 LAB — URINE CULTURE: Culture: >=100000 — AB

## 2017-04-18 LAB — LACTIC ACID, PLASMA: Lactic Acid, Venous: 1.1 mmol/L (ref 0.5–1.9)

## 2017-04-18 LAB — MAGNESIUM: MAGNESIUM: 2.1 mg/dL (ref 1.7–2.4)

## 2017-04-18 MED ORDER — LEVOFLOXACIN 250 MG PO TABS
250.0000 mg | ORAL_TABLET | Freq: Every day | ORAL | Status: DC
  Administered 2017-04-18 – 2017-04-19 (×2): 250 mg via ORAL
  Filled 2017-04-18 (×2): qty 1

## 2017-04-18 MED ORDER — FUROSEMIDE 20 MG PO TABS
20.0000 mg | ORAL_TABLET | Freq: Two times a day (BID) | ORAL | Status: DC
  Administered 2017-04-19: 10:00:00 20 mg via ORAL
  Filled 2017-04-18: qty 1

## 2017-04-18 NOTE — Progress Notes (Signed)
[place] [facility] [place] NAME: Meghan [name]    MR#:  [id number]  DATE OF BIRTH:  1946/05/03  SUBJECTIVE:  CHIEF COMPLAINT:   Chief Complaint  Patient presents with  . Respiratory Distress   Better shortness of breath, off oxygen this am REVIEW OF SYSTEMS:  Review of Systems  Constitutional: Positive for malaise/fatigue. Negative for chills and fever.  HENT: Negative for sore throat.   Eyes: Negative for blurred vision and double vision.  Respiratory: Positive for shortness of breath. Negative for cough, hemoptysis, wheezing and stridor.   Cardiovascular: Negative for chest pain, palpitations, orthopnea and leg swelling.  Gastrointestinal: Negative for abdominal pain, blood in stool, diarrhea, melena, nausea and vomiting.  Genitourinary: Negative for dysuria, flank pain and hematuria.  Musculoskeletal: Negative for back pain and joint pain.  Neurological: Positive for weakness. Negative for dizziness, sensory change, focal weakness, seizures, loss of consciousness and headaches.  Endo/Heme/Allergies: Negative for polydipsia.  Psychiatric/Behavioral: Negative for depression. The patient is not nervous/anxious.     DRUG ALLERGIES:   Allergies  Allergen Reactions  . Gabapentin Other (See Comments)    Pt states that it causes her BP to drop.   . Naproxen Hives  . Nsaids Other (See Comments)    Reaction:  Unknown   . Tramadol Itching   VITALS:  Blood pressure (!) 126/57, pulse 99, temperature 98.4 F (36.9 C), temperature source Oral, resp. rate (!) 21, height 4\' 8"  (1.422 m), weight 130 lb 8 oz (59.2 kg), SpO2 97 %. PHYSICAL EXAMINATION:  Physical Exam  Constitutional: She is oriented to person, place, and time and well-developed, well-nourished, and in no distress.  HENT:  Head: Normocephalic.  Mouth/Throat: Oropharynx is clear and moist.  Eyes: Pupils are equal, round, and reactive to light. Conjunctivae and EOM are normal. No  scleral icterus.  Neck: Normal range of motion. Neck supple. No JVD present. No tracheal deviation present.  Cardiovascular: Normal rate, regular rhythm and normal heart sounds.  Exam reveals no gallop.   No murmur heard. Pulmonary/Chest: Effort normal. No respiratory distress. She has no wheezes. She has rales.  Abdominal: Soft. Bowel sounds are normal. She exhibits no distension. There is no tenderness. There is no rebound.  Musculoskeletal: Normal range of motion. She exhibits no edema or tenderness.  Neurological: She is alert and oriented to person, place, and time. No cranial nerve deficit.  Skin: No rash noted. No erythema.  Psychiatric: Affect normal.   LABORATORY PANEL:  Female CBC  Recent Labs Lab 04/17/17 0404  WBC 4.9  HGB 11.6*  HCT 33.3*  PLT 206   ------------------------------------------------------------------------------------------------------------------ Chemistries   Recent Labs Lab 04/16/17 0728  04/18/17 0602  NA 138  < > 137  K 3.5  < > 3.5  CL 107  < > 104  CO2 23  < > 27  GLUCOSE 238*  < > 93  BUN 9  < > 11  CREATININE 0.61  < > 0.38*  CALCIUM 8.5*  < > 8.1*  MG 1.9  < > 2.1  AST 27  --   --   ALT 13*  --   --   ALKPHOS 103  --   --   BILITOT 0.8  --   --   < > = values in this interval not displayed. RADIOLOGY:  No results found. ASSESSMENT AND PLAN:   * sepsis secondary to UTI and likely pneumonia   Discontinue  Rocephin IV and change  to levaquin. U/C: KLEBSIELLA OXYTOCA   Sensitive to Rocephin and Cipro.   * acute respiratory failure with hypoxia   Secondary to acute on chronic systolic congestive heart failure EF 25%. off BiPAP.  weaned off oxygen [place]. NEB. Change to po Lasix, intake and output measurement, fluid restrictions. Continue coreg if BP allows. Consider entresto per Dr. Ubaldo Glassing.  Elevated troponin due to demanding ischemia.  * lactic acidosis Improved.  Hypokalemia. Improvement with potassium  supplement Hypomagnesemia. Given magnesium and improved.  * COPD   Continue nebulizer and Spiriva, no need to give steroid at this point is no wheezing.  Generalized weakness. PT evaluation suggest skilled nursing facility placement. All the records are reviewed and case discussed with Care Management/Social Worker. Management plans discussed with the patient, family and they are in agreement.  CODE STATUS: DNR  TOTAL TIME TAKING CARE OF THIS PATIENT: 30 minutes.   More than 50% of the time was spent in counseling/coordination of care: YES  POSSIBLE D/C IN 1-2 DAYS, DEPENDING ON CLINICAL CONDITION.   Demetrios Loll M.D on 04/18/2017 at 1:17 PM  Between 7am to 6pm - Pager - [phone]  After 6pm go to [email or web address] - [facility]

## 2017-04-18 NOTE — Progress Notes (Signed)
Decreased to room air after neb treatment.

## 2017-04-18 NOTE — Progress Notes (Signed)
BP 105/46, Scheduled Lasix IV held per order parameters; hold for SBP <110 or DBP <60.

## 2017-04-19 DIAGNOSIS — M5030 Other cervical disc degeneration, unspecified cervical region: Secondary | ICD-10-CM | POA: Diagnosis not present

## 2017-04-19 DIAGNOSIS — E7800 Pure hypercholesterolemia, unspecified: Secondary | ICD-10-CM | POA: Diagnosis not present

## 2017-04-19 DIAGNOSIS — I5022 Chronic systolic (congestive) heart failure: Secondary | ICD-10-CM | POA: Diagnosis not present

## 2017-04-19 DIAGNOSIS — E119 Type 2 diabetes mellitus without complications: Secondary | ICD-10-CM | POA: Diagnosis not present

## 2017-04-19 DIAGNOSIS — I208 Other forms of angina pectoris: Secondary | ICD-10-CM | POA: Diagnosis not present

## 2017-04-19 DIAGNOSIS — J449 Chronic obstructive pulmonary disease, unspecified: Secondary | ICD-10-CM | POA: Diagnosis not present

## 2017-04-19 DIAGNOSIS — I10 Essential (primary) hypertension: Secondary | ICD-10-CM | POA: Diagnosis not present

## 2017-04-19 DIAGNOSIS — I5023 Acute on chronic systolic (congestive) heart failure: Secondary | ICD-10-CM | POA: Diagnosis not present

## 2017-04-19 DIAGNOSIS — M359 Systemic involvement of connective tissue, unspecified: Secondary | ICD-10-CM | POA: Diagnosis not present

## 2017-04-19 DIAGNOSIS — J9690 Respiratory failure, unspecified, unspecified whether with hypoxia or hypercapnia: Secondary | ICD-10-CM | POA: Diagnosis not present

## 2017-04-19 DIAGNOSIS — I110 Hypertensive heart disease with heart failure: Secondary | ICD-10-CM | POA: Diagnosis not present

## 2017-04-19 DIAGNOSIS — R0602 Shortness of breath: Secondary | ICD-10-CM | POA: Diagnosis not present

## 2017-04-19 DIAGNOSIS — R5381 Other malaise: Secondary | ICD-10-CM | POA: Diagnosis not present

## 2017-04-19 DIAGNOSIS — A419 Sepsis, unspecified organism: Secondary | ICD-10-CM | POA: Diagnosis not present

## 2017-04-19 DIAGNOSIS — Z7401 Bed confinement status: Secondary | ICD-10-CM | POA: Diagnosis not present

## 2017-04-19 DIAGNOSIS — E876 Hypokalemia: Secondary | ICD-10-CM | POA: Diagnosis not present

## 2017-04-19 DIAGNOSIS — Z741 Need for assistance with personal care: Secondary | ICD-10-CM | POA: Diagnosis not present

## 2017-04-19 DIAGNOSIS — J45909 Unspecified asthma, uncomplicated: Secondary | ICD-10-CM | POA: Diagnosis not present

## 2017-04-19 DIAGNOSIS — I509 Heart failure, unspecified: Secondary | ICD-10-CM | POA: Diagnosis not present

## 2017-04-19 DIAGNOSIS — M069 Rheumatoid arthritis, unspecified: Secondary | ICD-10-CM | POA: Diagnosis not present

## 2017-04-19 DIAGNOSIS — Z9181 History of falling: Secondary | ICD-10-CM | POA: Diagnosis not present

## 2017-04-19 DIAGNOSIS — M6281 Muscle weakness (generalized): Secondary | ICD-10-CM | POA: Diagnosis not present

## 2017-04-19 DIAGNOSIS — R296 Repeated falls: Secondary | ICD-10-CM | POA: Diagnosis not present

## 2017-04-19 DIAGNOSIS — M810 Age-related osteoporosis without current pathological fracture: Secondary | ICD-10-CM | POA: Diagnosis not present

## 2017-04-19 DIAGNOSIS — R7989 Other specified abnormal findings of blood chemistry: Secondary | ICD-10-CM | POA: Diagnosis not present

## 2017-04-19 DIAGNOSIS — E785 Hyperlipidemia, unspecified: Secondary | ICD-10-CM | POA: Diagnosis not present

## 2017-04-19 DIAGNOSIS — R740 Nonspecific elevation of levels of transaminase and lactic acid dehydrogenase [LDH]: Secondary | ICD-10-CM | POA: Diagnosis not present

## 2017-04-19 DIAGNOSIS — J9621 Acute and chronic respiratory failure with hypoxia: Secondary | ICD-10-CM | POA: Diagnosis not present

## 2017-04-19 DIAGNOSIS — E872 Acidosis: Secondary | ICD-10-CM | POA: Diagnosis not present

## 2017-04-19 DIAGNOSIS — R011 Cardiac murmur, unspecified: Secondary | ICD-10-CM | POA: Diagnosis not present

## 2017-04-19 DIAGNOSIS — I2510 Atherosclerotic heart disease of native coronary artery without angina pectoris: Secondary | ICD-10-CM | POA: Diagnosis not present

## 2017-04-19 DIAGNOSIS — G43719 Chronic migraine without aura, intractable, without status migrainosus: Secondary | ICD-10-CM | POA: Diagnosis not present

## 2017-04-19 DIAGNOSIS — N390 Urinary tract infection, site not specified: Secondary | ICD-10-CM | POA: Diagnosis not present

## 2017-04-19 DIAGNOSIS — R269 Unspecified abnormalities of gait and mobility: Secondary | ICD-10-CM | POA: Diagnosis not present

## 2017-04-19 DIAGNOSIS — J9601 Acute respiratory failure with hypoxia: Secondary | ICD-10-CM | POA: Diagnosis not present

## 2017-04-19 DIAGNOSIS — G4700 Insomnia, unspecified: Secondary | ICD-10-CM | POA: Diagnosis not present

## 2017-04-19 DIAGNOSIS — A0472 Enterocolitis due to Clostridium difficile, not specified as recurrent: Secondary | ICD-10-CM | POA: Diagnosis not present

## 2017-04-19 LAB — GLUCOSE, CAPILLARY
GLUCOSE-CAPILLARY: 95 mg/dL (ref 65–99)
Glucose-Capillary: 138 mg/dL — ABNORMAL HIGH (ref 65–99)

## 2017-04-19 MED ORDER — CARVEDILOL 3.125 MG PO TABS: 3.1250 mg | ORAL_TABLET | Freq: Two times a day (BID) | ORAL | 0 refills | Status: DC

## 2017-04-19 MED ORDER — LEVOFLOXACIN 250 MG PO TABS
250.0000 mg | ORAL_TABLET | Freq: Every day | ORAL | 0 refills | Status: DC
Start: 2017-04-19 — End: 2017-04-19

## 2017-04-19 NOTE — Discharge Instructions (Signed)
Heart Failure Clinic appointment on April 26 2017 at 9:20am  with Darylene Price, [place]. Please call [phone] to reschedule.  Low sodium diet. Fall precaution.

## 2017-04-19 NOTE — Care Management Important Message (Signed)
Important Message  Patient Details  Name: MALEEYA PETERKIN MRN: [id number] Date of Birth: 22-Sep-1945   Medicare Important Message Given:  Yes (copy signed and scanned in 9/8)    Beverly Sessions, RN 04/19/2017, 10:37 AM

## 2017-04-19 NOTE — Progress Notes (Signed)
Pt instructed on discharge instructions and medications and verbalized understanding, Report called to [employer], to [place]. Pt awaiting EMS pick up.  Harlene Ramus, RN

## 2017-04-19 NOTE — Discharge Summary (Signed)
[facility] [place] NAME: Samhita Kretsch    MR#:  [id number]  DATE OF BIRTH:  [birth year]  DATE OF ADMISSION:  04/16/2017   ADMITTING PHYSICIAN: Vaughan Basta, MD  DATE OF DISCHARGE: 04/19/2017 PRIMARY CARE PHYSICIAN: Lavera Guise, MD   ADMISSION DIAGNOSIS:  CHF (congestive heart failure) (HCC) [I50.9] Acute respiratory failure with hypoxia (HCC) [J96.01] Acute on chronic congestive heart failure, unspecified heart failure type ([place]) [I50.9] DISCHARGE DIAGNOSIS:  Principal Problem:   Acute respiratory failure with hypoxia (HCC) Active Problems:   Acute on chronic systolic CHF (congestive heart failure) (HCC)   Acute lower UTI   Acute respiratory failure ([place])  SECONDARY DIAGNOSIS:   Past Medical History:  Diagnosis Date  . Anginal pain ([place])   . Anxiety   . Arthritis    RA  . Asthma   . Brain tumor (benign) ([place])   . CHF (congestive heart failure) ([place])   . Collagen vascular disease ([place])   . COPD (chronic obstructive pulmonary disease) ([place])   . Coronary artery disease   . DDD (degenerative disc disease)   . Depression   . Diabetes mellitus   . GERD (gastroesophageal reflux disease)   . Headache   . Heart murmur   . Heart murmur   . Hypercholesteremia   . Hypertension   . Lumbar degenerative disc disease   . Migraines   . Obese   . Obesity   . Osteopenia   . Osteoporosis   . Pneumonia   . PTSD (post-traumatic stress disorder)    HOSPITAL COURSE:   * sepsis secondary to UTI and likely pneumonia DiscontinuedRocephin IV and changed to levaquin (discontinue on discharge). U/C: KLEBSIELLA OXYTOCA   Sensitive to Rocephin and Cipro.  * acute respiratory failure with hypoxia Secondary to acute on chronic systolic congestive heart failure EF 25%. off BiPAP.  weaned off oxygen [place]. NEB. Changed to po Lasix, intake and output measurement, fluid restrictions. Continue coreg if BP allows. Consider entresto  per Dr. Ubaldo Glassing.  Elevated troponin due to demanding ischemia.  * lactic acidosis Improved.  Hypokalemia. Improvement with potassium supplement Hypomagnesemia. Given magnesium and improved.  * COPD Continue nebulizer and Spiriva, no need to give steroid at this point is no wheezing.  DISCHARGE CONDITIONS:  Stable, discharge to SNF today. CONSULTS OBTAINED:  Treatment Team:  Teodoro Spray, MD DRUG ALLERGIES:   Allergies  Allergen Reactions  . Gabapentin Other (See Comments)    Pt states that it causes her BP to drop.   . Naproxen Hives  . Nsaids Other (See Comments)    Reaction:  Unknown   . Tramadol Itching   DISCHARGE MEDICATIONS:   Allergies as of 04/19/2017      Reactions   Gabapentin Other (See Comments)   Pt states that it causes her BP to drop.    Naproxen Hives   Nsaids Other (See Comments)   Reaction:  Unknown    Tramadol Itching      Medication List    STOP taking these medications   metoprolol tartrate 25 MG tablet Commonly known as:  LOPRESSOR     TAKE these medications   budesonide 0.5 MG/2ML nebulizer solution Commonly known as:  PULMICORT Take 2 mLs (0.5 mg total) by nebulization 2 (two) times daily.   carvedilol 3.125 MG tablet Commonly known as:  COREG Take 1 tablet (3.125 mg total) by mouth 2 (two) times daily with a meal.   diphenoxylate-atropine 2.5-0.025  MG tablet Commonly known as:  LOMOTIL Take 1 tablet by mouth 3 (three) times daily as needed for diarrhea or loose stools.   escitalopram 10 MG tablet Commonly known as:  LEXAPRO Take 1 tablet (10 mg total) by mouth every morning.   feeding supplement (ENSURE ENLIVE) Liqd Take 237 mLs by mouth 2 (two) times daily between meals.   furosemide 40 MG tablet Commonly known as:  LASIX Take 0.5 tablets (20 mg total) by mouth daily.   insulin starter kit- pen needles Misc 1 kit by Other route once.   ipratropium-albuterol 0.5-2.5 (3) MG/3ML Soln Commonly known as:   DUONEB Take 3 mLs by nebulization 2 (two) times daily.   lamoTRIgine 100 MG tablet Commonly known as:  LAMICTAL Take 1 tablet (100 mg total) by mouth daily.   LYRICA 50 MG capsule Generic drug:  pregabalin TAKE ONE CAPSULE BY MOUTH TWICE A DAY   mirtazapine 7.5 MG tablet Commonly known as:  REMERON Take 7.5 mg by mouth at bedtime.   omeprazole 20 MG capsule Commonly known as:  PRILOSEC Take 20 mg by mouth at bedtime.   ondansetron 4 MG tablet Commonly known as:  ZOFRAN Take 4 mg by mouth every 3 (three) hours as needed for nausea or vomiting.   ONGLYZA 5 MG Tabs tablet Generic drug:  saxagliptin HCl Take 5 mg by mouth at bedtime.   Potassium Chloride ER 20 MEQ Tbcr Take 20 mEq by mouth daily.   pramipexole 0.25 MG tablet Commonly known as:  MIRAPEX Take 0.25 mg by mouth daily.   topiramate 25 MG tablet Commonly known as:  TOPAMAX Take 25 mg by mouth 2 (two) times daily.            Discharge Care Instructions        Start     Ordered   04/19/17 0000  Increase activity slowly     04/19/17 0909   04/19/17 0000  Diet - low sodium heart healthy     04/19/17 0909   04/19/17 0000  carvedilol (COREG) 3.125 MG tablet  2 times daily with meals     04/19/17 0913       DISCHARGE INSTRUCTIONS:  See AVS. If you experience worsening of your admission symptoms, develop shortness of breath, life threatening emergency, suicidal or homicidal thoughts you must seek medical attention immediately by calling 911 or calling your MD immediately  if symptoms less severe.  You Must read complete instructions/literature along with all the possible adverse reactions/side effects for all the Medicines you take and that have been prescribed to you. Take any new Medicines after you have completely understood and accpet all the possible adverse reactions/side effects.   Please note  You were cared for by a hospitalist during your hospital stay. If you have any questions about your  discharge medications or the care you received while you were in the hospital after you are discharged, you can call the unit and asked to speak with the hospitalist on call if the hospitalist that took care of you is not available. Once you are discharged, your primary care physician will handle any further medical issues. Please note that NO REFILLS for any discharge medications will be authorized once you are discharged, as it is imperative that you return to your primary care physician (or establish a relationship with a primary care physician if you do not have one) for your aftercare needs so that they can reassess your need for medications and monitor your  lab values.    On the day of Discharge:  VITAL SIGNS:  Blood pressure 117/68, pulse 95, temperature 98.2 F (36.8 C), temperature source Oral, resp. rate 18, height 4' 8"  (1.422 m), weight 128 lb (58.1 kg), SpO2 96 %. PHYSICAL EXAMINATION:  GENERAL:  71 y.o.-year-old patient lying in the bed with no acute distress.  EYES: Pupils equal, round, reactive to light and accommodation. No scleral icterus. Extraocular muscles intact.  HEENT: Head atraumatic, normocephalic. Oropharynx and nasopharynx clear.  NECK:  Supple, no jugular venous distention. No thyroid enlargement, no tenderness.  LUNGS: Normal breath sounds bilaterally, no wheezing, rales,rhonchi or crepitation. No use of accessory muscles of respiration.  CARDIOVASCULAR: S1, S2 normal. No murmurs, rubs, or gallops.  ABDOMEN: Soft, non-tender, non-distended. Bowel sounds present. No organomegaly or mass.  EXTREMITIES: No pedal edema, cyanosis, or clubbing.  NEUROLOGIC: Cranial nerves II through XII are intact. Muscle strength 4/5 in all extremities. Sensation intact. Gait not checked.  PSYCHIATRIC: The patient is alert and oriented x 3.  SKIN: No obvious rash, lesion, or ulcer.  DATA REVIEW:   CBC  Recent Labs Lab 04/17/17 0404  WBC 4.9  HGB 11.6*  HCT 33.3*  PLT 206     Chemistries   Recent Labs Lab 04/16/17 0728  04/18/17 0602  NA 138  < > 137  K 3.5  < > 3.5  CL 107  < > 104  CO2 23  < > 27  GLUCOSE 238*  < > 93  BUN 9  < > 11  CREATININE 0.61  < > 0.38*  CALCIUM 8.5*  < > 8.1*  MG 1.9  < > 2.1  AST 27  --   --   ALT 13*  --   --   ALKPHOS 103  --   --   BILITOT 0.8  --   --   < > = values in this interval not displayed.   Microbiology Results  Results for orders placed or performed during the hospital encounter of 04/16/17  CULTURE, BLOOD (ROUTINE X 2) w Reflex to ID Panel     Status: None (Preliminary result)   Collection Time: 04/16/17  7:20 AM  Result Value Ref Range Status   Specimen Description BLOOD RIGHT AC  Final   Special Requests   Final    BOTTLES DRAWN AEROBIC AND ANAEROBIC Blood Culture adequate volume   Culture NO GROWTH 3 DAYS  Final   Report Status PENDING  Incomplete  CULTURE, BLOOD (ROUTINE X 2) w Reflex to ID Panel     Status: None (Preliminary result)   Collection Time: 04/16/17  7:28 AM  Result Value Ref Range Status   Specimen Description BLOOD LEFT AC  Final   Special Requests   Final    BOTTLES DRAWN AEROBIC AND ANAEROBIC Blood Culture adequate volume   Culture NO GROWTH 3 DAYS  Final   Report Status PENDING  Incomplete  Urine Culture     Status: Abnormal   Collection Time: 04/16/17  7:51 AM  Result Value Ref Range Status   Specimen Description URINE, RANDOM  Final   Special Requests NONE  Final   Culture >=100,000 COLONIES/mL KLEBSIELLA OXYTOCA (A)  Final   Report Status 04/18/2017 FINAL  Final   Organism ID, Bacteria KLEBSIELLA OXYTOCA (A)  Final      Susceptibility   Klebsiella oxytoca - MIC*    AMPICILLIN RESISTANT Resistant     CEFAZOLIN <=4 SENSITIVE Sensitive     CEFTRIAXONE <=1  SENSITIVE Sensitive     CIPROFLOXACIN <=0.25 SENSITIVE Sensitive     GENTAMICIN <=1 SENSITIVE Sensitive     IMIPENEM <=0.25 SENSITIVE Sensitive     NITROFURANTOIN 64 INTERMEDIATE Intermediate     TRIMETH/SULFA  <=20 SENSITIVE Sensitive     AMPICILLIN/SULBACTAM 8 SENSITIVE Sensitive     PIP/TAZO 8 SENSITIVE Sensitive     Extended ESBL NEGATIVE Sensitive     * >=100,000 COLONIES/mL KLEBSIELLA OXYTOCA  MRSA PCR Screening     Status: None   Collection Time: 04/16/17  9:50 AM  Result Value Ref Range Status   MRSA by PCR NEGATIVE NEGATIVE Final    Comment:        The GeneXpert MRSA Assay (FDA approved for NASAL specimens only), is one component of a comprehensive MRSA colonization surveillance program. It is not intended to diagnose MRSA infection nor to guide or monitor treatment for MRSA infections.     RADIOLOGY:  No results found.   Management plans discussed with the patient, family and they are in agreement.  CODE STATUS: DNR   TOTAL TIME TAKING CARE OF THIS PATIENT: 35 minutes.    Demetrios Loll M.D on 04/19/2017 at 12:44 PM  Between 7am to 6pm - Pager - [phone]  After 6pm go to [email or web address] - password [email or web address] [facility]  [employer] [place] [facility]  Office  [phone]  CC: Primary care physician; Lavera Guise, MD   Note: This dictation was prepared with Dragon dictation along with smaller phrase technology. Any transcriptional errors that result from this process are unintentional.

## 2017-04-19 NOTE — Clinical Social Work Note (Addendum)
Patient to be d/c'ed today to [facility].  Patient and family agreeable to plans will transport via ems RN to call report [phone].  [name] grandaughter was notified.   Evette Cristal, MSW, [place]

## 2017-04-20 DIAGNOSIS — I509 Heart failure, unspecified: Secondary | ICD-10-CM | POA: Diagnosis not present

## 2017-04-20 DIAGNOSIS — J449 Chronic obstructive pulmonary disease, unspecified: Secondary | ICD-10-CM | POA: Diagnosis not present

## 2017-04-20 DIAGNOSIS — N390 Urinary tract infection, site not specified: Secondary | ICD-10-CM | POA: Diagnosis not present

## 2017-04-20 DIAGNOSIS — J9690 Respiratory failure, unspecified, unspecified whether with hypoxia or hypercapnia: Secondary | ICD-10-CM | POA: Diagnosis not present

## 2017-04-20 DIAGNOSIS — I2510 Atherosclerotic heart disease of native coronary artery without angina pectoris: Secondary | ICD-10-CM | POA: Diagnosis not present

## 2017-04-21 LAB — CULTURE, BLOOD (ROUTINE X 2)
CULTURE: NO GROWTH
Culture: NO GROWTH
SPECIAL REQUESTS: ADEQUATE
Special Requests: ADEQUATE

## 2017-04-26 ENCOUNTER — Telehealth: Payer: Self-pay | Admitting: Family

## 2017-04-26 ENCOUNTER — Ambulatory Visit: Payer: Medicare Other | Admitting: Family

## 2017-04-26 DIAGNOSIS — E119 Type 2 diabetes mellitus without complications: Secondary | ICD-10-CM | POA: Diagnosis not present

## 2017-04-26 DIAGNOSIS — M359 Systemic involvement of connective tissue, unspecified: Secondary | ICD-10-CM | POA: Diagnosis not present

## 2017-04-26 DIAGNOSIS — I10 Essential (primary) hypertension: Secondary | ICD-10-CM | POA: Diagnosis not present

## 2017-04-26 DIAGNOSIS — E785 Hyperlipidemia, unspecified: Secondary | ICD-10-CM | POA: Diagnosis not present

## 2017-04-26 DIAGNOSIS — J449 Chronic obstructive pulmonary disease, unspecified: Secondary | ICD-10-CM | POA: Diagnosis not present

## 2017-04-26 NOTE — Telephone Encounter (Signed)
Patient missed her initial appointment at the [facility] on 04/26/17. Will attempt to reschedule.

## 2017-04-28 DIAGNOSIS — R0602 Shortness of breath: Secondary | ICD-10-CM | POA: Diagnosis not present

## 2017-04-28 DIAGNOSIS — I5022 Chronic systolic (congestive) heart failure: Secondary | ICD-10-CM | POA: Diagnosis not present

## 2017-04-28 DIAGNOSIS — I10 Essential (primary) hypertension: Secondary | ICD-10-CM | POA: Diagnosis not present

## 2017-04-28 DIAGNOSIS — I208 Other forms of angina pectoris: Secondary | ICD-10-CM | POA: Diagnosis not present

## 2017-04-28 DIAGNOSIS — R011 Cardiac murmur, unspecified: Secondary | ICD-10-CM | POA: Diagnosis not present

## 2017-04-29 DIAGNOSIS — I110 Hypertensive heart disease with heart failure: Secondary | ICD-10-CM | POA: Diagnosis not present

## 2017-04-29 DIAGNOSIS — J449 Chronic obstructive pulmonary disease, unspecified: Secondary | ICD-10-CM | POA: Diagnosis not present

## 2017-04-29 DIAGNOSIS — I509 Heart failure, unspecified: Secondary | ICD-10-CM | POA: Diagnosis not present

## 2017-04-29 DIAGNOSIS — R5381 Other malaise: Secondary | ICD-10-CM | POA: Diagnosis not present

## 2017-05-04 ENCOUNTER — Emergency Department
Admission: EM | Admit: 2017-05-04 | Discharge: 2017-05-04 | Disposition: A | Discharge status: Home / Self Care | Payer: Medicare Other | Attending: Emergency Medicine | Admitting: Emergency Medicine

## 2017-05-04 ENCOUNTER — Encounter: Payer: Self-pay | Admitting: Emergency Medicine

## 2017-05-04 ENCOUNTER — Emergency Department: Payer: Medicare Other

## 2017-05-04 DIAGNOSIS — I110 Hypertensive heart disease with heart failure: Secondary | ICD-10-CM | POA: Insufficient documentation

## 2017-05-04 DIAGNOSIS — E119 Type 2 diabetes mellitus without complications: Secondary | ICD-10-CM | POA: Insufficient documentation

## 2017-05-04 DIAGNOSIS — I5022 Chronic systolic (congestive) heart failure: Secondary | ICD-10-CM | POA: Diagnosis not present

## 2017-05-04 DIAGNOSIS — Z79899 Other long term (current) drug therapy: Secondary | ICD-10-CM | POA: Diagnosis not present

## 2017-05-04 DIAGNOSIS — R0602 Shortness of breath: Secondary | ICD-10-CM | POA: Diagnosis not present

## 2017-05-04 DIAGNOSIS — J90 Pleural effusion, not elsewhere classified: Secondary | ICD-10-CM | POA: Diagnosis not present

## 2017-05-04 DIAGNOSIS — I2510 Atherosclerotic heart disease of native coronary artery without angina pectoris: Secondary | ICD-10-CM | POA: Insufficient documentation

## 2017-05-04 DIAGNOSIS — I509 Heart failure, unspecified: Secondary | ICD-10-CM | POA: Diagnosis not present

## 2017-05-04 DIAGNOSIS — J9621 Acute and chronic respiratory failure with hypoxia: Secondary | ICD-10-CM | POA: Diagnosis not present

## 2017-05-04 DIAGNOSIS — M5030 Other cervical disc degeneration, unspecified cervical region: Secondary | ICD-10-CM | POA: Diagnosis not present

## 2017-05-04 DIAGNOSIS — J45909 Unspecified asthma, uncomplicated: Secondary | ICD-10-CM | POA: Diagnosis not present

## 2017-05-04 DIAGNOSIS — E1140 Type 2 diabetes mellitus with diabetic neuropathy, unspecified: Secondary | ICD-10-CM | POA: Diagnosis not present

## 2017-05-04 DIAGNOSIS — J449 Chronic obstructive pulmonary disease, unspecified: Secondary | ICD-10-CM | POA: Insufficient documentation

## 2017-05-04 DIAGNOSIS — M069 Rheumatoid arthritis, unspecified: Secondary | ICD-10-CM | POA: Diagnosis not present

## 2017-05-04 DIAGNOSIS — G43719 Chronic migraine without aura, intractable, without status migrainosus: Secondary | ICD-10-CM | POA: Diagnosis not present

## 2017-05-04 DIAGNOSIS — M1991 Primary osteoarthritis, unspecified site: Secondary | ICD-10-CM | POA: Diagnosis not present

## 2017-05-04 DIAGNOSIS — R0600 Dyspnea, unspecified: Secondary | ICD-10-CM | POA: Diagnosis not present

## 2017-05-04 LAB — CBC WITH DIFFERENTIAL/PLATELET
BASOS ABS: 0 10*3/uL (ref 0–0.1)
Basophils Relative: 1 %
EOS PCT: 4 %
Eosinophils Absolute: 0.2 10*3/uL (ref 0–0.7)
HCT: 36.1 % (ref 35.0–47.0)
HEMOGLOBIN: 12.5 g/dL (ref 12.0–16.0)
LYMPHS ABS: 0.8 10*3/uL — AB (ref 1.0–3.6)
LYMPHS PCT: 18 %
MCH: 30.3 pg (ref 26.0–34.0)
MCHC: 34.5 g/dL (ref 32.0–36.0)
MCV: 88 fL (ref 80.0–100.0)
Monocytes Absolute: 0.3 10*3/uL (ref 0.2–0.9)
Monocytes Relative: 8 %
NEUTROS PCT: 69 %
Neutro Abs: 2.9 10*3/uL (ref 1.4–6.5)
PLATELETS: 212 10*3/uL (ref 150–440)
RBC: 4.11 MIL/uL (ref 3.80–5.20)
RDW: 13.6 % (ref 11.5–14.5)
WBC: 4.2 10*3/uL (ref 3.6–11.0)

## 2017-05-04 LAB — BASIC METABOLIC PANEL
ANION GAP: 7 (ref 5–15)
BUN: 7 mg/dL (ref 6–20)
CHLORIDE: 108 mmol/L (ref 101–111)
CO2: 25 mmol/L (ref 22–32)
Calcium: 8.7 mg/dL — ABNORMAL LOW (ref 8.9–10.3)
Creatinine, Ser: 0.52 mg/dL (ref 0.44–1.00)
GFR calc Af Amer: >60 mL/min (ref >60)
GLUCOSE: 104 mg/dL — AB (ref 65–99)
POTASSIUM: 3.2 mmol/L — AB (ref 3.5–5.1)
SODIUM: 140 mmol/L (ref 135–145)

## 2017-05-04 LAB — TROPONIN I: Troponin I: <0.03 ng/mL (ref <0.03)

## 2017-05-04 LAB — BRAIN NATRIURETIC PEPTIDE: B NATRIURETIC PEPTIDE 5: 1154 pg/mL — AB (ref 0.0–100.0)

## 2017-05-04 NOTE — ED Notes (Signed)
Pt is oriented but seems confused at times.

## 2017-05-04 NOTE — ED Triage Notes (Signed)
Pt presents to ED by EMS from home with c/o sob. Onset 0600. HX of COPD. Initial O2 saturation 91% on room air. 2L O2 placed on pt upon EMS arrival resulting in saturation of 97%. Denies cough. Pt currently has no increased work of breathing noted. Able to answer questions without difficulty. HR with EMS was 55-60 and BP 126/70. Denies cough or congestion. D/c from this facility yesterday after she fell.

## 2017-05-04 NOTE — ED Provider Notes (Signed)
[facility] Emergency Department Provider Note   ____________________________________________    I have reviewed the triage vital signs and the nursing notes.   HISTORY  Chief Complaint Shortness of Breath     HPI Meghan [name] is a 71 y.o. female Who presents with complaints of shortness of breath. Patient reports she was discharged in the hospital yesterday. Medical records demonstrate she was treated for CHF. She reports she felt well when she left the hospital yesterday and felt well when she went to sleep however she woke up at 6:30 AM and felt short of breath. She reports she sleeps with 2 pillows. Denies chest pain. No fevers or cough. She reports now she feels much better and has no shortness of breath.   Past Medical History:  Diagnosis Date  . Anginal pain ([place])   . Anxiety   . Arthritis    RA  . Asthma   . Brain tumor (benign) ([place])   . CHF (congestive heart failure) ([place])   . Collagen vascular disease ([place])   . COPD (chronic obstructive pulmonary disease) ([place])   . Coronary artery disease   . DDD (degenerative disc disease)   . Depression   . Diabetes mellitus   . GERD (gastroesophageal reflux disease)   . Headache   . Heart murmur   . Heart murmur   . Hypercholesteremia   . Hypertension   . Lumbar degenerative disc disease   . Migraines   . Obese   . Obesity   . Osteopenia   . Osteoporosis   . Pneumonia   . PTSD (post-traumatic stress disorder)     Patient Active Problem List   Diagnosis Date Noted  . Acute on chronic systolic CHF (congestive heart failure) ([place]) 04/16/2017  . Acute lower UTI 04/16/2017  . Acute respiratory failure ([place]) 04/16/2017  . Pressure injury of skin 03/22/2017  . HCAP (healthcare-associated pneumonia) 03/20/2017  . Acute on chronic respiratory failure with hypoxia ([place]) 03/20/2017  . Acute respiratory failure with hypoxia ([place]) 03/20/2017  . Near syncope 02/27/2017  . Dehydration  02/27/2017  . Colitis 02/22/2017  . Seizure ([place]) 01/03/2016  . Chronic tension-type headache, intractable 12/04/2015  . 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 Degeneration of intervertebral disc of lumbar region 07/15/2015  . Seropositive rheumatoid arthritis ([place]) 07/15/2015  . Asthma with acute exacerbation 03/10/2015  . Hypokalemia 03/01/2015  . Hyponatremia 03/01/2015  . DDD (degenerative disc disease), lumbar 01/31/2015  . Arthritis, degenerative 01/31/2015  . Rheumatoid arthritis with rheumatoid factor ([place]) 01/31/2015  . HTN (hypertension) 12/24/2014  . Sepsis ([place]) 12/24/2014  . Left knee pain 12/24/2014  . GERD (gastroesophageal reflux disease) 12/24/2014  . COPD (chronic obstructive pulmonary disease) ([place]) 12/24/2014  . Depression 12/24/2014  . Anxiety 12/24/2014  . Severe bipolar disorder with psychotic features, mood-congruent ([place]) 11/16/2014  . Neurosis, posttraumatic 11/16/2014  . H/O gastric ulcer 11/16/2014  . Barton's fracture of distal radius, closed 09/19/2014  . Neuritis or radiculitis due to rupture of lumbar intervertebral disc 05/11/2014  . Cervico-occipital neuralgia 03/26/2014  . Difficulty in walking 03/26/2014  . Difficulty in walking, not elsewhere classified 03/26/2014  . Cervical spine syndrome 01/26/2014  . Cephalalgia 01/09/2014  . Disordered sleep 01/09/2014  . BP (high blood pressure) 10/19/2013  . Adiposity 10/19/2013  . Cardiac murmur 10/19/2013  . PNA (pneumonia) 10/19/2013  . Breath shortness 10/19/2013  . Chronic obstructive pulmonary disease ([place]) 10/19/2013  . Diabetes mellitus ([place]) 10/19/2013    Past Surgical  History:  Procedure Laterality Date  . ABDOMINAL HYSTERECTOMY    . CERVICAL FUSION    . CHOLECYSTECTOMY    . COLONOSCOPY WITH PROPOFOL N/A 09/20/2015   Procedure: COLONOSCOPY WITH PROPOFOL;  Surgeon: Josefine Class, MD;  Location[facility] ENDOSCOPY;  Service: Endoscopy;  Laterality: N/A;  . COLONOSCOPY WITH PROPOFOL N/A  01/27/2016   Procedure: COLONOSCOPY WITH PROPOFOL;  Surgeon: Manya Silvas, MD;  Location: [facility] ENDOSCOPY;  Service: Endoscopy;  Laterality: N/A;  . FINGER ARTHROPLASTY  02/23/2012   Procedure: FINGER ARTHROPLASTY;  Surgeon: Cammie Sickle., MD;  Location: [facility];  Service: Orthopedics;  Laterality: Left;  Extensor carpi radialis longus to Extensor carpi ulnaris transfer, left Metaphalangeal reconstructions of index and long fingers,  . FOOT ARTHROPLASTY     toes x2 rt foot  . HAND RECONSTRUCTION  2011   right-multiple finger joint reconst  . JOINT REPLACEMENT     bilat knee replacements    Prior to Admission medications   Medication Sig Start Date End Date Taking? Authorizing Provider  budesonide (PULMICORT) 0.5 MG/2ML nebulizer solution Take 2 mLs (0.5 mg total) by nebulization 2 (two) times daily. 03/02/17   Max Sane, MD  carvedilol (COREG) 3.125 MG tablet Take 1 tablet (3.125 mg total) by mouth 2 (two) times daily with a meal. 04/19/17   Demetrios Loll, MD  diphenoxylate-atropine (LOMOTIL) 2.5-0.025 MG tablet Take 1 tablet by mouth 3 (three) times daily as needed for diarrhea or loose stools.    [provider]  escitalopram (LEXAPRO) 10 MG tablet Take 1 tablet (10 mg total) by mouth every morning. 09/11/16   Rainey Pines, MD  feeding supplement, ENSURE ENLIVE, (ENSURE ENLIVE) LIQD Take 237 mLs by mouth 2 (two) times daily between meals. 03/24/17   Hillary Bow, MD  furosemide (LASIX) 40 MG tablet Take 0.5 tablets (20 mg total) by mouth daily. 03/25/17   Hillary Bow, MD  insulin starter kit- pen needles MISC 1 kit by Other route once. 03/09/15   Aldean Jewett, MD  ipratropium-albuterol (DUONEB) 0.5-2.5 (3) MG/3ML SOLN Take 3 mLs by nebulization 2 (two) times daily. 03/04/17   Loletha Grayer, MD  lamoTRIgine (LAMICTAL) 100 MG tablet Take 1 tablet (100 mg total) by mouth daily. 06/02/16   Rainey Pines, MD  mirtazapine (REMERON) 7.5 MG tablet Take 7.5 mg by  mouth at bedtime.     [provider]  omeprazole (PRILOSEC) 20 MG capsule Take 20 mg by mouth at bedtime.    [provider]  ondansetron (ZOFRAN) 4 MG tablet Take 4 mg by mouth every 3 (three) hours as needed for nausea or vomiting.     [provider]  potassium chloride 20 MEQ TBCR Take 20 mEq by mouth daily. 03/24/17   Hillary Bow, MD  pramipexole (MIRAPEX) 0.25 MG tablet Take 0.25 mg by mouth daily.     [provider]  pregabalin (LYRICA) 50 MG capsule TAKE ONE CAPSULE BY MOUTH TWICE A DAY 06/18/15   [provider]  saxagliptin HCl (ONGLYZA) 5 MG TABS tablet Take 5 mg by mouth at bedtime.    [provider]  topiramate (TOPAMAX) 25 MG tablet Take 25 mg by mouth 2 (two) times daily.    [provider]     Allergies Gabapentin; Naproxen; Nsaids; and Tramadol  Family History  Problem Relation Age of Onset  . Depression Sister   . Hypertension Mother   . Arthritis/Rheumatoid Mother   . Heart attack Father   .  Hypertension Father   . CAD Unknown   . Hypertension Unknown   . Diabetes Mellitus II Unknown   . Arthritis Unknown     Social History Social History  Substance Use Topics  . Smoking status: Never Smoker  . Smokeless tobacco: Never Used  . Alcohol use No    Review of Systems  Constitutional: No fever/chills Eyes: No visual changes.  ENT: No throat swelling Cardiovascular: Denies chest pain. Respiratory: as above Gastrointestinal: No abdominal pain.  No nausea, no vomiting.   Genitourinary: Negative for dysuria. Musculoskeletal: Negative for back pain. Skin: Negative for rash. Neurological: Negative for headaches    ____________________________________________   PHYSICAL EXAM:  VITAL SIGNS: ED Triage Vitals  Enc Vitals Group     BP 05/04/17 0647 123/78     Pulse Rate 05/04/17 0647 (!) 101     Resp 05/04/17 0647 (!) 21     Temp 05/04/17 0647 98.1 F (36.7 C)     Temp Source 05/04/17  0647 Oral     SpO2 05/04/17 0647 96 %     Weight 05/04/17 0650 54 kg (119 lb)     Height 05/04/17 0650 1.422 m (_0 )     Head Circumference --      Peak Flow --      Pain Score --      Pain Loc --      Pain Edu? --      Excl. in [place]? --     Constitutional: Alert and oriented. No acute distress. Pleasant and interactive Eyes: Conjunctivae are normal.   Nose: No congestion/rhinnorhea. Mouth/Throat: Mucous membranes are moist.    Cardiovascular: Normal rate, regular rhythm. Grossly normal heart sounds.  Good peripheral circulation. Respiratory: Normal respiratory effort.  No retractions. Lungs CTAB. Gastrointestinal: Soft and nontender. No distention.  No CVA tenderness.  Musculoskeletal:   Warm and well perfused Neurologic:  Normal speech and language. No gross focal neurologic deficits are appreciated.  Skin:  Skin is warm, dry and intact. No rash noted. Psychiatric: Mood and affect are normal. Speech and behavior are normal.  ____________________________________________   LABS (all labs ordered are listed, but only abnormal results are displayed)  Labs Reviewed  CBC WITH DIFFERENTIAL/PLATELET - Abnormal; Notable for the following:       Result Value   Lymphs Abs 0.8 (*)    All other components within normal limits  BASIC METABOLIC PANEL - Abnormal; Notable for the following:    Potassium 3.2 (*)    Glucose, Bld 104 (*)    Calcium 8.7 (*)    All other components within normal limits  BRAIN NATRIURETIC PEPTIDE - Abnormal; Notable for the following:    B Natriuretic Peptide 1,154.0 (*)    All other components within normal limits  TROPONIN I   ____________________________________________  EKG  ED ECG REPORT I, Lavonia Drafts, the attending physician, personally viewed and interpreted this ECG.  Date: 05/04/2017  Rate: 102 Rhythm: normal sinus rhythm QRS Axis: normal Intervals: left bundle branch block, old ST/T Wave abnormalities: normal Narrative  Interpretation: no evidence of acute ischemia  ____________________________________________  RADIOLOGY  Chest x-ray not significantly changed from prior ____________________________________________   PROCEDURES  Procedure(s) performed: No    Critical Care performed: No ____________________________________________   INITIAL IMPRESSION / ASSESSMENT AND PLAN / ED COURSE  Pertinent labs & imaging results that were available during my care of the patient were reviewed by me and considered in my medical decision making (see chart for details).  Differential includes PND, CHF, pneumonia.  Patient well-appearing and in no acute distress. Thus far workup is unremarkable. Exam is reassuring. Vitals are normal. This appears to be an episode of PND, she may need to sleep at somewhat more upright angle  Patient ambulated around the department, no shortness of breath. She feels quite well and is anxious to go home. Lab work is overall reassuring. BNP is decreased from prior Chest x-ray unchanged    ____________________________________________   FINAL CLINICAL IMPRESSION(S) / ED DIAGNOSES  Final diagnoses:  Shortness of breath      NEW MEDICATIONS STARTED DURING THIS VISIT:  New Prescriptions   No medications on file     Note:  This document was prepared using Dragon voice recognition software and may include unintentional dictation errors.    Lavonia Drafts, MD 05/04/17 [phone]

## 2017-05-04 NOTE — ED Notes (Signed)
Pt ambulated with RN and walker. sats remains 98% room air. Pt felt mildly dizzy but steady gait and ambulated without assistance. Dr Corky Downs aware.

## 2017-05-05 ENCOUNTER — Encounter: Payer: Self-pay | Admitting: Emergency Medicine

## 2017-05-05 ENCOUNTER — Emergency Department: Payer: Medicare Other

## 2017-05-05 ENCOUNTER — Observation Stay
Admission: EM | Admit: 2017-05-05 | Discharge: 2017-05-07 | Disposition: A | Discharge status: Home / Self Care | Payer: Medicare Other | Attending: Internal Medicine | Admitting: Internal Medicine

## 2017-05-05 DIAGNOSIS — F329 Major depressive disorder, single episode, unspecified: Secondary | ICD-10-CM | POA: Diagnosis not present

## 2017-05-05 DIAGNOSIS — K219 Gastro-esophageal reflux disease without esophagitis: Secondary | ICD-10-CM | POA: Insufficient documentation

## 2017-05-05 DIAGNOSIS — I5021 Acute systolic (congestive) heart failure: Secondary | ICD-10-CM | POA: Diagnosis not present

## 2017-05-05 DIAGNOSIS — Z7951 Long term (current) use of inhaled steroids: Secondary | ICD-10-CM | POA: Diagnosis not present

## 2017-05-05 DIAGNOSIS — R197 Diarrhea, unspecified: Secondary | ICD-10-CM | POA: Insufficient documentation

## 2017-05-05 DIAGNOSIS — E119 Type 2 diabetes mellitus without complications: Secondary | ICD-10-CM | POA: Insufficient documentation

## 2017-05-05 DIAGNOSIS — I429 Cardiomyopathy, unspecified: Secondary | ICD-10-CM | POA: Insufficient documentation

## 2017-05-05 DIAGNOSIS — J449 Chronic obstructive pulmonary disease, unspecified: Secondary | ICD-10-CM | POA: Insufficient documentation

## 2017-05-05 DIAGNOSIS — Z79899 Other long term (current) drug therapy: Secondary | ICD-10-CM | POA: Diagnosis not present

## 2017-05-05 DIAGNOSIS — F419 Anxiety disorder, unspecified: Secondary | ICD-10-CM | POA: Diagnosis not present

## 2017-05-05 DIAGNOSIS — R918 Other nonspecific abnormal finding of lung field: Secondary | ICD-10-CM | POA: Diagnosis not present

## 2017-05-05 DIAGNOSIS — Z96653 Presence of artificial knee joint, bilateral: Secondary | ICD-10-CM | POA: Insufficient documentation

## 2017-05-05 DIAGNOSIS — Z8679 Personal history of other diseases of the circulatory system: Secondary | ICD-10-CM | POA: Diagnosis not present

## 2017-05-05 DIAGNOSIS — Z7984 Long term (current) use of oral hypoglycemic drugs: Secondary | ICD-10-CM | POA: Insufficient documentation

## 2017-05-05 DIAGNOSIS — R55 Syncope and collapse: Secondary | ICD-10-CM | POA: Diagnosis not present

## 2017-05-05 DIAGNOSIS — I2510 Atherosclerotic heart disease of native coronary artery without angina pectoris: Secondary | ICD-10-CM | POA: Insufficient documentation

## 2017-05-05 DIAGNOSIS — E876 Hypokalemia: Secondary | ICD-10-CM | POA: Diagnosis not present

## 2017-05-05 DIAGNOSIS — Z96698 Presence of other orthopedic joint implants: Secondary | ICD-10-CM | POA: Diagnosis not present

## 2017-05-05 DIAGNOSIS — Z96692 Finger-joint replacement of left hand: Secondary | ICD-10-CM | POA: Diagnosis not present

## 2017-05-05 DIAGNOSIS — I110 Hypertensive heart disease with heart failure: Secondary | ICD-10-CM | POA: Insufficient documentation

## 2017-05-05 LAB — HEPATIC FUNCTION PANEL
ALT: 10 U/L — ABNORMAL LOW (ref 14–54)
AST: 23 U/L (ref 15–41)
Albumin: 2.7 g/dL — ABNORMAL LOW (ref 3.5–5.0)
Alkaline Phosphatase: 73 U/L (ref 38–126)
BILIRUBIN DIRECT: 0.2 mg/dL (ref 0.1–0.5)
BILIRUBIN INDIRECT: 0.8 mg/dL (ref 0.3–0.9)
TOTAL PROTEIN: 5.4 g/dL — AB (ref 6.5–8.1)
Total Bilirubin: 1 mg/dL (ref 0.3–1.2)

## 2017-05-05 LAB — BASIC METABOLIC PANEL
Anion gap: 12 (ref 5–15)
BUN: 7 mg/dL (ref 6–20)
CALCIUM: 8.7 mg/dL — AB (ref 8.9–10.3)
CO2: 21 mmol/L — AB (ref 22–32)
CREATININE: 0.52 mg/dL (ref 0.44–1.00)
Chloride: 108 mmol/L (ref 101–111)
GFR calc Af Amer: >60 mL/min (ref >60)
GFR calc non Af Amer: >60 mL/min (ref >60)
GLUCOSE: 87 mg/dL (ref 65–99)
Potassium: 3.1 mmol/L — ABNORMAL LOW (ref 3.5–5.1)
Sodium: 141 mmol/L (ref 135–145)

## 2017-05-05 LAB — CBC
HCT: 35.6 % (ref 35.0–47.0)
Hemoglobin: 12.3 g/dL (ref 12.0–16.0)
MCH: 30.3 pg (ref 26.0–34.0)
MCHC: 34.6 g/dL (ref 32.0–36.0)
MCV: 87.7 fL (ref 80.0–100.0)
PLATELETS: 236 10*3/uL (ref 150–440)
RBC: 4.06 MIL/uL (ref 3.80–5.20)
RDW: 13.5 % (ref 11.5–14.5)
WBC: 4.9 10*3/uL (ref 3.6–11.0)

## 2017-05-05 LAB — DIFFERENTIAL
BASOS PCT: 1 %
Basophils Absolute: 0 10*3/uL (ref 0–0.1)
EOS PCT: 1 %
Eosinophils Absolute: 0 10*3/uL (ref 0–0.7)
LYMPHS PCT: 27 %
Lymphs Abs: 1.3 10*3/uL (ref 1.0–3.6)
MONO ABS: 0.5 10*3/uL (ref 0.2–0.9)
Monocytes Relative: 10 %
NEUTROS ABS: 2.9 10*3/uL (ref 1.4–6.5)
NEUTROS PCT: 61 %

## 2017-05-05 LAB — URINALYSIS, COMPLETE (UACMP) WITH MICROSCOPIC
BILIRUBIN URINE: NEGATIVE
GLUCOSE, UA: NEGATIVE mg/dL
Hgb urine dipstick: NEGATIVE
KETONES UR: 80 mg/dL — AB
Leukocytes, UA: NEGATIVE
Nitrite: NEGATIVE
PH: 7 (ref 5.0–8.0)
Protein, ur: NEGATIVE mg/dL
SPECIFIC GRAVITY, URINE: 1.013 (ref 1.005–1.030)

## 2017-05-05 LAB — TROPONIN I: Troponin I: <0.03 ng/mL (ref <0.03)

## 2017-05-05 LAB — BRAIN NATRIURETIC PEPTIDE: B Natriuretic Peptide: 1286 pg/mL — ABNORMAL HIGH (ref 0.0–100.0)

## 2017-05-05 LAB — LIPASE, BLOOD: Lipase: 15 U/L (ref 11–51)

## 2017-05-05 LAB — GLUCOSE, CAPILLARY: Glucose-Capillary: 75 mg/dL (ref 65–99)

## 2017-05-05 LAB — LACTIC ACID, PLASMA: LACTIC ACID, VENOUS: 1.7 mmol/L (ref 0.5–1.9)

## 2017-05-05 MED ORDER — ESCITALOPRAM OXALATE 10 MG PO TABS
10.0000 mg | ORAL_TABLET | Freq: Every morning | ORAL | Status: DC
  Administered 2017-05-06 – 2017-05-07 (×2): 10 mg via ORAL
  Filled 2017-05-05 (×2): qty 1

## 2017-05-05 MED ORDER — ONDANSETRON HCL 4 MG PO TABS
4.0000 mg | ORAL_TABLET | ORAL | Status: DC | PRN
  Administered 2017-05-05: 4 mg via ORAL

## 2017-05-05 MED ORDER — LAMOTRIGINE 100 MG PO TABS
100.0000 mg | ORAL_TABLET | Freq: Every day | ORAL | Status: DC
  Administered 2017-05-06 – 2017-05-07 (×2): 100 mg via ORAL
  Filled 2017-05-05 (×2): qty 1

## 2017-05-05 MED ORDER — ONDANSETRON HCL 4 MG PO TABS
ORAL_TABLET | ORAL | Status: AC
  Filled 2017-05-05: qty 1

## 2017-05-05 MED ORDER — PANTOPRAZOLE SODIUM 40 MG PO TBEC
40.0000 mg | DELAYED_RELEASE_TABLET | Freq: Every day | ORAL | Status: DC
  Administered 2017-05-06 – 2017-05-07 (×2): 40 mg via ORAL
  Filled 2017-05-05 (×2): qty 1

## 2017-05-05 MED ORDER — IPRATROPIUM-ALBUTEROL 0.5-2.5 (3) MG/3ML IN SOLN
3.0000 mL | Freq: Two times a day (BID) | RESPIRATORY_TRACT | Status: DC
  Administered 2017-05-05 – 2017-05-07 (×4): 3 mL via RESPIRATORY_TRACT
  Filled 2017-05-05 (×4): qty 3

## 2017-05-05 MED ORDER — PREGABALIN 50 MG PO CAPS
50.0000 mg | ORAL_CAPSULE | Freq: Two times a day (BID) | ORAL | Status: DC
  Administered 2017-05-05 – 2017-05-07 (×4): 50 mg via ORAL
  Filled 2017-05-05 (×4): qty 1

## 2017-05-05 MED ORDER — ONDANSETRON HCL 4 MG/2ML IJ SOLN
4.0000 mg | Freq: Four times a day (QID) | INTRAMUSCULAR | Status: DC | PRN
  Administered 2017-05-05 – 2017-05-07 (×3): 4 mg via INTRAVENOUS
  Filled 2017-05-05 (×2): qty 2

## 2017-05-05 MED ORDER — ACETAMINOPHEN 325 MG PO TABS: 650.0000 mg | ORAL_TABLET | Freq: Four times a day (QID) | ORAL | Status: DC | PRN

## 2017-05-05 MED ORDER — TOPIRAMATE 25 MG PO TABS
25.0000 mg | ORAL_TABLET | Freq: Two times a day (BID) | ORAL | Status: DC
  Administered 2017-05-05 – 2017-05-07 (×4): 25 mg via ORAL
  Filled 2017-05-05 (×5): qty 1

## 2017-05-05 MED ORDER — SODIUM CHLORIDE 0.9% FLUSH
3.0000 mL | INTRAVENOUS | Status: DC | PRN
  Administered 2017-05-07: 3 mL via INTRAVENOUS
  Filled 2017-05-05: qty 3

## 2017-05-05 MED ORDER — PRAMIPEXOLE DIHYDROCHLORIDE 0.25 MG PO TABS
0.2500 mg | ORAL_TABLET | Freq: Every day | ORAL | Status: DC
  Administered 2017-05-06 – 2017-05-07 (×2): 0.25 mg via ORAL
  Filled 2017-05-05 (×2): qty 1

## 2017-05-05 MED ORDER — MIRTAZAPINE 15 MG PO TABS
7.5000 mg | ORAL_TABLET | Freq: Every day | ORAL | Status: DC
  Administered 2017-05-06: 7.5 mg via ORAL
  Filled 2017-05-05: qty 1

## 2017-05-05 MED ORDER — BUSPIRONE HCL 5 MG PO TABS
7.5000 mg | ORAL_TABLET | Freq: Two times a day (BID) | ORAL | Status: DC
  Administered 2017-05-05 – 2017-05-07 (×3): 7.5 mg via ORAL
  Filled 2017-05-05 (×2): qty 2
  Filled 2017-05-05: qty 1.5

## 2017-05-05 MED ORDER — LINAGLIPTIN 5 MG PO TABS
5.0000 mg | ORAL_TABLET | Freq: Every day | ORAL | Status: DC
Start: 2017-05-06 — End: 2017-05-07
  Administered 2017-05-06 – 2017-05-07 (×2): 5 mg via ORAL
  Filled 2017-05-05 (×2): qty 1

## 2017-05-05 MED ORDER — ENOXAPARIN SODIUM 40 MG/0.4ML ~~LOC~~ SOLN
40.0000 mg | SUBCUTANEOUS | Status: DC
  Administered 2017-05-05 – 2017-05-06 (×2): 40 mg via SUBCUTANEOUS
  Filled 2017-05-05 (×2): qty 0.4

## 2017-05-05 MED ORDER — POTASSIUM CHLORIDE CRYS ER 20 MEQ PO TBCR
20.0000 meq | EXTENDED_RELEASE_TABLET | Freq: Two times a day (BID) | ORAL | Status: DC
  Administered 2017-05-05: 20 meq via ORAL
  Filled 2017-05-05: qty 1

## 2017-05-05 MED ORDER — LISINOPRIL 5 MG PO TABS
2.5000 mg | ORAL_TABLET | Freq: Two times a day (BID) | ORAL | Status: DC
  Administered 2017-05-05: 2.5 mg via ORAL
  Filled 2017-05-05 (×3): qty 1

## 2017-05-05 MED ORDER — SODIUM CHLORIDE 0.9 % IV SOLN: 250.0000 mL | INTRAVENOUS | Status: DC | PRN

## 2017-05-05 MED ORDER — ONDANSETRON HCL 4 MG/2ML IJ SOLN
INTRAMUSCULAR | Status: AC
  Filled 2017-05-05: qty 2

## 2017-05-05 MED ORDER — POTASSIUM CHLORIDE CRYS ER 20 MEQ PO TBCR: 20.0000 meq | EXTENDED_RELEASE_TABLET | Freq: Every day | ORAL | Status: DC

## 2017-05-05 MED ORDER — BUDESONIDE 0.5 MG/2ML IN SUSP
0.5000 mg | Freq: Two times a day (BID) | RESPIRATORY_TRACT | Status: DC
  Administered 2017-05-05 – 2017-05-07 (×4): 0.5 mg via RESPIRATORY_TRACT
  Filled 2017-05-05 (×4): qty 2

## 2017-05-05 MED ORDER — SODIUM CHLORIDE 0.9% FLUSH
3.0000 mL | Freq: Two times a day (BID) | INTRAVENOUS | Status: DC
  Administered 2017-05-05 – 2017-05-07 (×4): 3 mL via INTRAVENOUS

## 2017-05-05 MED ORDER — INSULIN ASPART 100 UNIT/ML ~~LOC~~ SOLN
0.0000 [IU] | Freq: Three times a day (TID) | SUBCUTANEOUS | Status: DC
  Administered 2017-05-06 – 2017-05-07 (×2): 1 [IU] via SUBCUTANEOUS
  Filled 2017-05-05 (×2): qty 1

## 2017-05-05 MED ORDER — CARVEDILOL 3.125 MG PO TABS
3.1250 mg | ORAL_TABLET | Freq: Two times a day (BID) | ORAL | Status: DC
  Administered 2017-05-06 – 2017-05-07 (×3): 3.125 mg via ORAL
  Filled 2017-05-05 (×3): qty 1

## 2017-05-05 MED ORDER — ONDANSETRON HCL 4 MG PO TABS: 4.0000 mg | ORAL_TABLET | Freq: Four times a day (QID) | ORAL | Status: DC | PRN

## 2017-05-05 MED ORDER — ACETAMINOPHEN 650 MG RE SUPP: 650.0000 mg | Freq: Four times a day (QID) | RECTAL | Status: DC | PRN

## 2017-05-05 MED ORDER — FUROSEMIDE 10 MG/ML IJ SOLN
20.0000 mg | Freq: Two times a day (BID) | INTRAMUSCULAR | Status: DC
  Administered 2017-05-05 – 2017-05-07 (×3): 20 mg via INTRAVENOUS
  Filled 2017-05-05 (×3): qty 2

## 2017-05-05 NOTE — H&P (Signed)
Meghan [name] is an 71 y.o. female.   Chief Complaint: Passing out HPI: This is a 71 year old female who has a history of CHF and coronary artery disease. She states she passed out twice today. She says once while she was coming back from the bathroom. The second time while she was lying in bed. She said she got hot and flushed feeling. She said she also felt like this but didn't pass out when EMS picked her up. She called her son who called the home health nurse and they discussed on the phone and she was advised to come to the hospital. He does not have any chest pain with this she does feel short of breath associated felt that way ever since she was discharged from the hospital back couple months ago. Hospitalist services were consulted for admission.  Past Medical History:  Diagnosis Date  . Anginal pain ([place])   . Anxiety   . Arthritis    RA  . Asthma   . Brain tumor (benign) ([place])   . CHF (congestive heart failure) ([place])   . Collagen vascular disease ([place])   . COPD (chronic obstructive pulmonary disease) ([place])   . Coronary artery disease   . DDD (degenerative disc disease)   . Depression   . Diabetes mellitus   . GERD (gastroesophageal reflux disease)   . Headache   . Heart murmur   . Heart murmur   . Hypercholesteremia   . Hypertension   . Lumbar degenerative disc disease   . Migraines   . Obese   . Obesity   . Osteopenia   . Osteoporosis   . Pneumonia   . PTSD (post-traumatic stress disorder)     Past Surgical History:  Procedure Laterality Date  . ABDOMINAL HYSTERECTOMY    . CERVICAL FUSION    . CHOLECYSTECTOMY    . COLONOSCOPY WITH PROPOFOL N/A 09/20/2015   Procedure: COLONOSCOPY WITH PROPOFOL;  Surgeon: Josefine Class, MD;  Location: [facility] ENDOSCOPY;  Service: Endoscopy;  Laterality: N/A;  . COLONOSCOPY WITH PROPOFOL N/A 01/27/2016   Procedure: COLONOSCOPY WITH PROPOFOL;  Surgeon: Manya Silvas, MD;  Location: [facility] ENDOSCOPY;  Service: Endoscopy;  Laterality:  N/A;  . FINGER ARTHROPLASTY  02/23/2012   Procedure: FINGER ARTHROPLASTY;  Surgeon: Cammie Sickle., MD;  Location: [place];  Service: Orthopedics;  Laterality: Left;  Extensor carpi radialis longus to Extensor carpi ulnaris transfer, left Metaphalangeal reconstructions of index and long fingers,  . FOOT ARTHROPLASTY     toes x2 rt foot  . HAND RECONSTRUCTION  2011   right-multiple finger joint reconst  . JOINT REPLACEMENT     bilat knee replacements    Family History  Problem Relation Age of Onset  . Depression Sister   . Hypertension Mother   . Arthritis/Rheumatoid Mother   . Heart attack Father   . Hypertension Father   . CAD Unknown   . Hypertension Unknown   . Diabetes Mellitus II Unknown   . Arthritis Unknown    Social History:  reports that she has never smoked. She has never used smokeless tobacco. She reports that she does not drink alcohol or use drugs.  Allergies:  Allergies  Allergen Reactions  . Gabapentin Other (See Comments)    Pt states that it causes her BP to drop.   . Naproxen Hives  . Nsaids Other (See Comments)    Reaction:  Unknown   . Tramadol Itching     (Not in  a hospital admission)  Results for orders placed or performed during the hospital encounter of 05/05/17 (from the past 48 hour(s))  Basic metabolic panel     Status: Abnormal   Collection Time: 05/05/17  2:12 PM  Result Value Ref Range   Sodium 141 135 - 145 mmol/L   Potassium 3.1 (L) 3.5 - 5.1 mmol/L   Chloride 108 101 - 111 mmol/L   CO2 21 (L) 22 - 32 mmol/L   Glucose, Bld 87 65 - 99 mg/dL   BUN 7 6 - 20 mg/dL   Creatinine, Ser 0.52 0.44 - 1.00 mg/dL   Calcium 8.7 (L) 8.9 - 10.3 mg/dL   GFR calc non Af Amer >60 >60 mL/min   GFR calc Af Amer >60 >60 mL/min    Comment: (NOTE) The eGFR has been calculated using the CKD EPI equation. This calculation has not been validated in all clinical situations. eGFR's persistently <60 mL/min signify possible Chronic  Kidney Disease.    Anion gap 12 5 - 15  CBC     Status: None   Collection Time: 05/05/17  2:12 PM  Result Value Ref Range   WBC 4.9 3.6 - 11.0 K/uL   RBC 4.06 3.80 - 5.20 MIL/uL   Hemoglobin 12.3 12.0 - 16.0 g/dL   HCT 35.6 35.0 - 47.0 %   MCV 87.7 80.0 - 100.0 fL   MCH 30.3 26.0 - 34.0 pg   MCHC 34.6 32.0 - 36.0 g/dL   RDW 13.5 11.5 - 14.5 %   Platelets 236 150 - 440 K/uL  Lipase, blood     Status: None   Collection Time: 05/05/17  2:12 PM  Result Value Ref Range   Lipase 15 11 - 51 U/L  Troponin I     Status: None   Collection Time: 05/05/17  2:12 PM  Result Value Ref Range   Troponin I <0.03 <0.03 ng/mL  Brain natriuretic peptide     Status: Abnormal   Collection Time: 05/05/17  2:12 PM  Result Value Ref Range   B Natriuretic Peptide 1,286.0 (H) 0.0 - 100.0 pg/mL  Hepatic function panel     Status: Abnormal   Collection Time: 05/05/17  2:12 PM  Result Value Ref Range   Total Protein 5.4 (L) 6.5 - 8.1 g/dL   Albumin 2.7 (L) 3.5 - 5.0 g/dL   AST 23 15 - 41 U/L   ALT 10 (L) 14 - 54 U/L   Alkaline Phosphatase 73 38 - 126 U/L   Total Bilirubin 1.0 0.3 - 1.2 mg/dL   Bilirubin, Direct 0.2 0.1 - 0.5 mg/dL   Indirect Bilirubin 0.8 0.3 - 0.9 mg/dL  Differential     Status: None   Collection Time: 05/05/17  2:12 PM  Result Value Ref Range   Neutrophils Relative % 61 %   Neutro Abs 2.9 1.4 - 6.5 K/uL   Lymphocytes Relative 27 %   Lymphs Abs 1.3 1.0 - 3.6 K/uL   Monocytes Relative 10 %   Monocytes Absolute 0.5 0.2 - 0.9 K/uL   Eosinophils Relative 1 %   Eosinophils Absolute 0.0 0 - 0.7 K/uL   Basophils Relative 1 %   Basophils Absolute 0.0 0 - 0.1 K/uL  Urinalysis, Complete w Microscopic     Status: Abnormal   Collection Time: 05/05/17  3:29 PM  Result Value Ref Range   Color, Urine YELLOW (A) YELLOW   APPearance HAZY (A) CLEAR   Specific Gravity, Urine 1.013 1.005 - 1.030  pH 7.0 5.0 - 8.0   Glucose, UA NEGATIVE NEGATIVE mg/dL   Hgb urine dipstick NEGATIVE NEGATIVE    Bilirubin Urine NEGATIVE NEGATIVE   Ketones, ur 80 (A) NEGATIVE mg/dL   Protein, ur NEGATIVE NEGATIVE mg/dL   Nitrite NEGATIVE NEGATIVE   Leukocytes, UA NEGATIVE NEGATIVE   RBC / HPF 0-5 0 - 5 RBC/hpf   WBC, UA 0-5 0 - 5 WBC/hpf   Bacteria, UA RARE (A) NONE SEEN   Squamous Epithelial / LPF 0-5 (A) NONE SEEN   Mucus PRESENT   Lactic acid, plasma     Status: None   Collection Time: 05/05/17  3:29 PM  Result Value Ref Range   Lactic Acid, Venous 1.7 0.5 - 1.9 mmol/L   Dg Chest Portable 1 View  Result Date: 05/05/2017 CLINICAL DATA:  Pt in via EMS from home with c/o syncope. EMS reports pt was here for MI 3 weeks ago and just got home from rehab on Monday. EMS reports per pt she woke up twice and felt like she had passed out both times. EMS reports pt was diaphoretic upon arrival. Pt denies pain at this time but reports feels nauseated and has had some diarrhea. EMS reports FBSB 95, BP 124/80. Pt denies CP, SOB at this time. States she had diarrhea and feels nauseated EXAM: PORTABLE CHEST 1 VIEW COMPARISON:  05/04/2017 FINDINGS: Cardiac silhouette is mildly enlarged. No mediastinal or hilar masses. There is opacity at both lung bases obscuring hemidiaphragms consistent with a combination of pleural effusions with either atelectasis, pneumonia or a combination. There is no evidence of pulmonary edema. Remainder of the lungs is clear. No pneumothorax. Skeletal structures are demineralized but grossly intact. IMPRESSION: 1. Bilateral lung base opacity consistent with a combination of pleural effusions and atelectasis. Pneumonia is possible. Lung base opacity is mildly increased from the prior exam, most evident on the right. 2. No pulmonary edema. Electronically Signed   By: Lajean Manes M.D.   On: 05/05/2017 15:07   Dg Chest Portable 1 View  Result Date: 05/04/2017 CLINICAL DATA:  Dyspnea EXAM: PORTABLE CHEST 1 VIEW COMPARISON:  04/17/2017 FINDINGS: Low volume chest with greater elevation of the  right diaphragm. Streaky opacities at the bases. Probable small pleural effusions. Normal heart size. Stable mediastinal contours. No edema or pneumothorax. IMPRESSION: Low volume chest with atelectasis and small effusions at the bases, stable from 04/17/2017. Superimposed infection could be obscured. Electronically Signed   By: Monte Fantasia M.D.   On: 05/04/2017 07:06    Review of Systems  Constitutional: Negative for chills and fever.  HENT: Negative for hearing loss.   Eyes: Negative for blurred vision.  Respiratory: Positive for shortness of breath. Negative for cough.   Cardiovascular: Negative for chest pain and palpitations.  Gastrointestinal: Negative for nausea.  Genitourinary: Negative for dysuria.  Musculoskeletal: Negative for myalgias.  Skin: Negative for rash.  Neurological: Negative for dizziness.    Blood pressure 127/69, pulse 100, temperature 99.5 F (37.5 C), temperature source Oral, resp. rate 17, height _0  (1.422 m), weight 54 kg (119 lb), SpO2 95 %. Physical Exam  Constitutional: She is oriented to person, place, and time. She appears well-developed and well-nourished. No distress.  HENT:  Head: Normocephalic and atraumatic.  Mouth/Throat: Oropharynx is clear and moist. No oropharyngeal exudate.  Eyes: Pupils are equal, round, and reactive to light. EOM are normal.  Neck: Normal range of motion. Neck supple. No JVD present. No tracheal deviation present. No thyromegaly present.  Cardiovascular: Normal rate and regular rhythm.   No murmur heard. Respiratory: Effort normal and breath sounds normal. No respiratory distress. She exhibits no tenderness.  GI: Soft. Bowel sounds are normal. She exhibits no distension and no mass.  Musculoskeletal: She exhibits no edema or tenderness.  Neurological: She is alert and oriented to person, place, and time.  Skin: Skin is warm and dry.     Assessment/Plan 1. Syncope. She said she had 2 episodes. These were  unwitnessed. She's not orthostatic. Do not have any suspicion of seizure activity. At this point would be more suspicious of cardiac arrhythmia based on her medical history. We'll go ahead and admit her to telemetry. Monitor overnight. Get physical therapy to see her tomorrow says she doesn't ambulation. Consult cardiology to help make determination if she needs to go home on a Holter monitor. 2. Hypokalemia. This is mild. We'll go ahead and give her oral repletion. 3. Congestive heart failure. On a mild elevation in BNP. However she does have very mild pleural effusions and feels short of breath. We'll go ahead and put her on some diuretics by IV for the next 24 hours to see if this improves symptoms.  4. Coronary artery disease. EKG looks normal. We'll go ahead and cycle her enzymes. 5. Diabetes. Continue her current medications and add sliding scale. Total time spent was 45 minutes  Baxter Hire, MD 05/05/2017, 6:33 PM

## 2017-05-05 NOTE — ED Triage Notes (Signed)
Pt in via EMS from home with c/o syncope. EMS reports pt was here for MI 3 weeks ago and just got home from rehab on Monday. EMS reports per pt she woke up twice and felt like she had passed out both times. EMS reports pt was diaphoretic upon arrival. Pt denies pain at this time but reports feels nauseated and has had some diarrhea. EMS reports FBSB 95, BP 124/80.   Pt denies CP, SOB at this time. States she had diarrhea and feels nauseated.

## 2017-05-05 NOTE — ED Provider Notes (Signed)
[facility] Emergency Department Provider Note   ____________________________________________   First MD Initiated Contact with Patient 05/05/17 1447     (approximate)  I have reviewed the triage vital signs and the nursing notes.   HISTORY  Chief Complaint Loss of Consciousness and Nausea    HPI Meghan [name] is a 71 y.o. female She reports she's been laying in bed and passed out x 2. She says she did not go to sleep but is sure she passed out. She work up and did not know what happened. The first time she woke up she was sweatty but not the second time. She has not had any chest pain today, she is not short of breath and has not been today she does have a moderate headache but she has had headaches like this before. She feels OK now.   Past Medical History:  Diagnosis Date  . Anginal pain ([place])   . Anxiety   . Arthritis    RA  . Asthma   . Brain tumor (benign) ([place])   . CHF (congestive heart failure) ([place])   . Collagen vascular disease ([place])   . COPD (chronic obstructive pulmonary disease) ([place])   . Coronary artery disease   . DDD (degenerative disc disease)   . Depression   . Diabetes mellitus   . GERD (gastroesophageal reflux disease)   . Headache   . Heart murmur   . Heart murmur   . Hypercholesteremia   . Hypertension   . Lumbar degenerative disc disease   . Migraines   . Obese   . Obesity   . Osteopenia   . Osteoporosis   . Pneumonia   . PTSD (post-traumatic stress disorder)     Patient Active Problem List   Diagnosis Date Noted  . Acute on chronic systolic CHF (congestive heart failure) ([place]) 04/16/2017  . Acute lower UTI 04/16/2017  . Acute respiratory failure ([place]) 04/16/2017  . Pressure injury of skin 03/22/2017  . HCAP (healthcare-associated pneumonia) 03/20/2017  . Acute on chronic respiratory failure with hypoxia ([place]) 03/20/2017  . Acute respiratory failure with hypoxia ([place]) 03/20/2017  . Near syncope 02/27/2017    . Dehydration 02/27/2017  . Colitis 02/22/2017  . Seizure ([place]) 01/03/2016  . Chronic tension-type headache, intractable 12/04/2015  . 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 Degeneration of intervertebral disc of lumbar region 07/15/2015  . Seropositive rheumatoid arthritis ([place]) 07/15/2015  . Asthma with acute exacerbation 03/10/2015  . Hypokalemia 03/01/2015  . Hyponatremia 03/01/2015  . DDD (degenerative disc disease), lumbar 01/31/2015  . Arthritis, degenerative 01/31/2015  . Rheumatoid arthritis with rheumatoid factor ([place]) 01/31/2015  . HTN (hypertension) 12/24/2014  . Sepsis ([place]) 12/24/2014  . Left knee pain 12/24/2014  . GERD (gastroesophageal reflux disease) 12/24/2014  . COPD (chronic obstructive pulmonary disease) ([place]) 12/24/2014  . Depression 12/24/2014  . Anxiety 12/24/2014  . Severe bipolar disorder with psychotic features, mood-congruent ([place]) 11/16/2014  . Neurosis, posttraumatic 11/16/2014  . H/O gastric ulcer 11/16/2014  . Barton's fracture of distal radius, closed 09/19/2014  . Neuritis or radiculitis due to rupture of lumbar intervertebral disc 05/11/2014  . Cervico-occipital neuralgia 03/26/2014  . Difficulty in walking 03/26/2014  . Difficulty in walking, not elsewhere classified 03/26/2014  . Cervical spine syndrome 01/26/2014  . Cephalalgia 01/09/2014  . Disordered sleep 01/09/2014  . BP (high blood pressure) 10/19/2013  . Adiposity 10/19/2013  . Cardiac murmur 10/19/2013  . PNA (pneumonia) 10/19/2013  . Breath shortness 10/19/2013  .  Chronic obstructive pulmonary disease ([place]) 10/19/2013  . Diabetes mellitus ([place]) 10/19/2013    Past Surgical History:  Procedure Laterality Date  . ABDOMINAL HYSTERECTOMY    . CERVICAL FUSION    . CHOLECYSTECTOMY    . COLONOSCOPY WITH PROPOFOL N/A 09/20/2015   Procedure: COLONOSCOPY WITH PROPOFOL;  Surgeon: Josefine Class, MD;  Location: [facility] ENDOSCOPY;  Service: Endoscopy;  Laterality: N/A;  . COLONOSCOPY  WITH PROPOFOL N/A 01/27/2016   Procedure: COLONOSCOPY WITH PROPOFOL;  Surgeon: Manya Silvas, MD;  Location: [facility] ENDOSCOPY;  Service: Endoscopy;  Laterality: N/A;  . FINGER ARTHROPLASTY  02/23/2012   Procedure: FINGER ARTHROPLASTY;  Surgeon: Cammie Sickle., MD;  Location: [place];  Service: Orthopedics;  Laterality: Left;  Extensor carpi radialis longus to Extensor carpi ulnaris transfer, left Metaphalangeal reconstructions of index and long fingers,  . FOOT ARTHROPLASTY     toes x2 rt foot  . HAND RECONSTRUCTION  2011   right-multiple finger joint reconst  . JOINT REPLACEMENT     bilat knee replacements    Prior to Admission medications   Medication Sig Start Date End Date Taking? Authorizing Provider  busPIRone (BUSPAR) 7.5 MG tablet Take 1 tablet by mouth 2 (two) times daily.   Yes [provider]  carvedilol (COREG) 3.125 MG tablet Take 1 tablet (3.125 mg total) by mouth 2 (two) times daily with a meal. 04/19/17  Yes Demetrios Loll, MD  escitalopram (LEXAPRO) 10 MG tablet Take 1 tablet (10 mg total) by mouth every morning. 09/11/16  Yes Rainey Pines, MD  feeding supplement, ENSURE ENLIVE, (ENSURE ENLIVE) LIQD Take 237 mLs by mouth 2 (two) times daily between meals. 03/24/17  Yes Sudini, Alveta Heimlich, MD  furosemide (LASIX) 40 MG tablet Take 0.5 tablets (20 mg total) by mouth daily. 03/25/17  Yes Hillary Bow, MD  lamoTRIgine (LAMICTAL) 100 MG tablet Take 1 tablet (100 mg total) by mouth daily. 06/02/16  Yes Rainey Pines, MD  lisinopril (PRINIVIL,ZESTRIL) 2.5 MG tablet Take 1 tablet by mouth 2 (two) times daily.   Yes [provider]  mirtazapine (REMERON) 7.5 MG tablet Take 7.5 mg by mouth at bedtime.    Yes [provider]  omeprazole (PRILOSEC) 20 MG capsule Take 20 mg by mouth at bedtime.   Yes [provider]  potassium chloride 20 MEQ TBCR Take 20 mEq by mouth daily. 03/24/17  Yes Sudini, Alveta Heimlich, MD  pramipexole (MIRAPEX) 0.25 MG tablet  Take 0.25 mg by mouth daily.    Yes [provider]  pregabalin (LYRICA) 50 MG capsule TAKE ONE CAPSULE BY MOUTH TWICE A DAY 06/18/15  Yes [provider]  saxagliptin HCl (ONGLYZA) 5 MG TABS tablet Take 5 mg by mouth at bedtime.   Yes [provider]  topiramate (TOPAMAX) 25 MG tablet Take 25 mg by mouth 2 (two) times daily.   Yes [provider]  budesonide (PULMICORT) 0.5 MG/2ML nebulizer solution Take 2 mLs (0.5 mg total) by nebulization 2 (two) times daily. 03/02/17   Max Sane, MD  diphenoxylate-atropine (LOMOTIL) 2.5-0.025 MG tablet Take 1 tablet by mouth 3 (three) times daily as needed for diarrhea or loose stools.    [provider]  insulin starter kit- pen needles MISC 1 kit by Other route once. 03/09/15   Aldean Jewett, MD  ipratropium-albuterol (DUONEB) 0.5-2.5 (3) MG/3ML SOLN Take 3 mLs by nebulization 2 (two) times daily. 03/04/17   Loletha Grayer, MD  ondansetron (ZOFRAN) 4 MG tablet Take 4 mg  by mouth every 3 (three) hours as needed for nausea or vomiting.     [provider]    Allergies Gabapentin; Naproxen; Nsaids; and Tramadol  Family History  Problem Relation Age of Onset  . Depression Sister   . Hypertension Mother   . Arthritis/Rheumatoid Mother   . Heart attack Father   . Hypertension Father   . CAD Unknown   . Hypertension Unknown   . Diabetes Mellitus II Unknown   . Arthritis Unknown     Social History Social History  Substance Use Topics  . Smoking status: Never Smoker  . Smokeless tobacco: Never Used  . Alcohol use No    Review of Systems  Constitutional: No fever/chills Eyes: No visual changes. ENT: No sore throat. Cardiovascular: Denies chest pain. Respiratory: Denies shortness of breath. Gastrointestinal: No abdominal pain.  No nausea, no vomiting.  No diarrhea.  No constipation. Genitourinary: Negative for dysuria. Musculoskeletal: Negative for back pain. Skin: Negative for  rash. Neurological: Negative for focal weakness  ____________________________________________   PHYSICAL EXAM:  VITAL SIGNS: ED Triage Vitals  Enc Vitals Group     BP 05/05/17 1409 126/65     Pulse Rate 05/05/17 1409 97     Resp 05/05/17 1409 (!) 22     Temp 05/05/17 1409 99.5 F (37.5 C)     Temp Source 05/05/17 1409 Oral     SpO2 05/05/17 1409 94 %     Weight 05/05/17 1411 119 lb (54 kg)     Height 05/05/17 1411 _0  (1.422 m)     Head Circumference --      Peak Flow --      Pain Score --      Pain Loc --      Pain Edu? --      Excl. in [place]? --     Constitutional: Alert and oriented. Well appearing and in no acute distress. Eyes: Conjunctivae are normal.  Head: Atraumatic. Nose: No congestion/rhinnorhea. Mouth/Throat: Mucous membranes are moist.  Oropharynx non-erythematous. Neck: No stridor.   Cardiovascular: Normal rate, regular rhythm. Grossly normal heart sounds.  Good peripheral circulation. Respiratory: Normal respiratory effort.  No retractions. Lungs CTAB. Gastrointestinal: Soft and nontender. No distention. No abdominal bruits. No CVA tenderness. Musculoskeletal: No lower extremity tenderness nor edema.  No joint effusions. changes of rheumatoid arthritis in the hands Neurologic:  Normal speech and language. No gross focal neurologic deficits are appreciated. \ Skin:  Skin is warm, dry and intact. No rash noted. Psychiatric: Mood and affect are normal. Speech and behavior are normal.  ____________________________________________   LABS (all labs ordered are listed, but only abnormal results are displayed)  Labs Reviewed  BASIC METABOLIC PANEL - Abnormal; Notable for the following:       Result Value   Potassium 3.1 (*)    CO2 21 (*)    Calcium 8.7 (*)    All other components within normal limits  URINALYSIS, COMPLETE (UACMP) WITH MICROSCOPIC - Abnormal; Notable for the following:    Color, Urine YELLOW (*)    APPearance HAZY (*)    Ketones, ur 80 (*)     Bacteria, UA RARE (*)    Squamous Epithelial / LPF 0-5 (*)    All other components within normal limits  BRAIN NATRIURETIC PEPTIDE - Abnormal; Notable for the following:    B Natriuretic Peptide 1,286.0 (*)    All other components within normal limits  HEPATIC FUNCTION PANEL - Abnormal; Notable for the following:  Total Protein 5.4 (*)    Albumin 2.7 (*)    ALT 10 (*)    All other components within normal limits  CBC  LIPASE, BLOOD  LACTIC ACID, PLASMA  TROPONIN I  DIFFERENTIAL  CBG MONITORING, ED   ____________________________________________  EKG  EKG read and interpreted by me s shows normal sinus rhythm at 98 left axis left bundle-branch block no acute changes ____________________________________________  RADIOLOGY  Dg Chest Portable 1 View  Result Date: 05/05/2017 CLINICAL DATA:  Pt in via EMS from home with c/o syncope. EMS reports pt was here for MI 3 weeks ago and just got home from rehab on Monday. EMS reports per pt she woke up twice and felt like she had passed out both times. EMS reports pt was diaphoretic upon arrival. Pt denies pain at this time but reports feels nauseated and has had some diarrhea. EMS reports FBSB 95, BP 124/80. Pt denies CP, SOB at this time. States she had diarrhea and feels nauseated EXAM: PORTABLE CHEST 1 VIEW COMPARISON:  05/04/2017 FINDINGS: Cardiac silhouette is mildly enlarged. No mediastinal or hilar masses. There is opacity at both lung bases obscuring hemidiaphragms consistent with a combination of pleural effusions with either atelectasis, pneumonia or a combination. There is no evidence of pulmonary edema. Remainder of the lungs is clear. No pneumothorax. Skeletal structures are demineralized but grossly intact. IMPRESSION: 1. Bilateral lung base opacity consistent with a combination of pleural effusions and atelectasis. Pneumonia is possible. Lung base opacity is mildly increased from the prior exam, most evident on the right. 2. No  pulmonary edema. Electronically Signed   By: Lajean Manes M.D.   On: 05/05/2017 15:07   X-ray report read and reviewed films read and reviewed agree with radiology report ____________________________________________   PROCEDURES  Procedure(s) performed:   Procedures  Critical Care performed:   ____________________________________________   INITIAL IMPRESSION / ASSESSMENT AND PLAN / ED COURSE  Pertinent labs & imaging results that were available during my care of the patient were reviewed by me and considered in my medical decision making (see chart for details).   patient apparently had syncope twice while laying down. She does have a very low-grade fever otherwise besides a chest x-ray which looks only slightly different than previously she appears to be okay. However because of her recent heart attack history I think the best thing to do would be watch her in the hospital overnight. I hate to send her home if she was having any kind of dysrhythmia or anything else.     ____________________________________________   FINAL CLINICAL IMPRESSION(S) / ED DIAGNOSES  Final diagnoses:  Syncope and collapse      NEW MEDICATIONS STARTED DURING THIS VISIT:  New Prescriptions   No medications on file     Note:  This document was prepared using Dragon voice recognition software and may include unintentional dictation errors.    Nena Polio, MD 05/05/17 [id number]

## 2017-05-05 NOTE — ED Notes (Signed)
Attempted report. RN reported she could not take at this time due to shift change

## 2017-05-06 ENCOUNTER — Observation Stay: Payer: Medicare Other

## 2017-05-06 DIAGNOSIS — I5023 Acute on chronic systolic (congestive) heart failure: Secondary | ICD-10-CM | POA: Diagnosis not present

## 2017-05-06 DIAGNOSIS — R55 Syncope and collapse: Secondary | ICD-10-CM | POA: Diagnosis not present

## 2017-05-06 DIAGNOSIS — I6523 Occlusion and stenosis of bilateral carotid arteries: Secondary | ICD-10-CM | POA: Diagnosis not present

## 2017-05-06 DIAGNOSIS — I2510 Atherosclerotic heart disease of native coronary artery without angina pectoris: Secondary | ICD-10-CM | POA: Diagnosis not present

## 2017-05-06 DIAGNOSIS — Z8679 Personal history of other diseases of the circulatory system: Secondary | ICD-10-CM | POA: Diagnosis not present

## 2017-05-06 DIAGNOSIS — E876 Hypokalemia: Secondary | ICD-10-CM | POA: Diagnosis not present

## 2017-05-06 LAB — GLUCOSE, CAPILLARY
GLUCOSE-CAPILLARY: 94 mg/dL (ref 65–99)
GLUCOSE-CAPILLARY: 105 mg/dL — AB (ref 65–99)
Glucose-Capillary: 133 mg/dL — ABNORMAL HIGH (ref 65–99)
Glucose-Capillary: 114 mg/dL — ABNORMAL HIGH (ref 65–99)

## 2017-05-06 LAB — CBC
HCT: 36.1 % (ref 35.0–47.0)
HEMOGLOBIN: 12.5 g/dL (ref 12.0–16.0)
MCH: 30.2 pg (ref 26.0–34.0)
MCHC: 34.6 g/dL (ref 32.0–36.0)
MCV: 87.3 fL (ref 80.0–100.0)
PLATELETS: 220 10*3/uL (ref 150–440)
RBC: 4.14 MIL/uL (ref 3.80–5.20)
RDW: 13.4 % (ref 11.5–14.5)
WBC: 3.6 10*3/uL (ref 3.6–11.0)

## 2017-05-06 LAB — BASIC METABOLIC PANEL
Anion gap: 10 (ref 5–15)
BUN: 5 mg/dL — AB (ref 6–20)
CHLORIDE: 105 mmol/L (ref 101–111)
CO2: 26 mmol/L (ref 22–32)
CREATININE: 0.51 mg/dL (ref 0.44–1.00)
Calcium: 8 mg/dL — ABNORMAL LOW (ref 8.9–10.3)
GFR calc Af Amer: >60 mL/min (ref >60)
GFR calc non Af Amer: >60 mL/min (ref >60)
GLUCOSE: 130 mg/dL — AB (ref 65–99)
POTASSIUM: 2.5 mmol/L — AB (ref 3.5–5.1)
Sodium: 141 mmol/L (ref 135–145)

## 2017-05-06 LAB — TROPONIN I
Troponin I: <0.03 ng/mL (ref <0.03)
Troponin I: <0.03 ng/mL (ref <0.03)

## 2017-05-06 LAB — MRSA PCR SCREENING: MRSA by PCR: POSITIVE — AB

## 2017-05-06 LAB — MAGNESIUM: MAGNESIUM: 1.4 mg/dL — AB (ref 1.7–2.4)

## 2017-05-06 MED ORDER — CHLORHEXIDINE GLUCONATE CLOTH 2 % EX PADS
6.0000 | MEDICATED_PAD | Freq: Every day | CUTANEOUS | Status: DC
  Administered 2017-05-07: 6 via TOPICAL

## 2017-05-06 MED ORDER — MORPHINE SULFATE (PF) 2 MG/ML IV SOLN
2.0000 mg | Freq: Once | INTRAVENOUS | Status: AC
  Administered 2017-05-06: 2 mg via INTRAVENOUS
  Filled 2017-05-06: qty 1

## 2017-05-06 MED ORDER — MAGNESIUM SULFATE 2 GM/50ML IV SOLN
2.0000 g | Freq: Once | INTRAVENOUS | Status: AC
  Administered 2017-05-06: 2 g via INTRAVENOUS
  Filled 2017-05-06: qty 50

## 2017-05-06 MED ORDER — POTASSIUM CHLORIDE CRYS ER 20 MEQ PO TBCR
40.0000 meq | EXTENDED_RELEASE_TABLET | Freq: Two times a day (BID) | ORAL | Status: DC
  Administered 2017-05-06 – 2017-05-07 (×3): 40 meq via ORAL
  Filled 2017-05-06 (×3): qty 2

## 2017-05-06 MED ORDER — MUPIROCIN 2 % EX OINT
1.0000 | TOPICAL_OINTMENT | Freq: Two times a day (BID) | CUTANEOUS | Status: DC
Start: 2017-05-06 — End: 2017-05-07
  Administered 2017-05-06 – 2017-05-07 (×2): 1 via NASAL
  Filled 2017-05-06: qty 22

## 2017-05-06 NOTE — Consult Note (Signed)
   [facility] CM Inpatient Consult   05/06/2017  Meghan [name] 15-Jun-1946 [id number]    [facility] Care Management referral received for multiple hospitalizations.   Chart reviewed. Contacted inpatient RNCM to discuss referral.   Telephone call into patient's room twice without any answer.   Will attempt to call back at later time to discuss [place] Management services.     Marthenia Rolling, MSN-Ed, RN,BSN [facility] Liaison [phone]

## 2017-05-06 NOTE — Consult Note (Addendum)
   [facility] CM Inpatient Consult   05/06/2017  SERAPHIM AFFINITO 1946/03/03 [id number]   Second attempt to reach Mrs. Bennett Scrape via phone to discuss [place] Management services due to referral.   Telephone rang and rang. No answer.  Contacted nursing station to inquire about patient's status. Made aware Mrs. Stephens was asleep. Does not appear she is scheduled for dc today.   Will request [facility] Liaison to follow up tomorrow on [place] Management referral.   Left voicemail for inpatient RNCM to make aware of above.   Marthenia Rolling, MSN-Ed, RN,BSN [facility] Liaison [phone]

## 2017-05-06 NOTE — Progress Notes (Signed)
Initial Nutrition Assessment  DOCUMENTATION CODES:   Not applicable  INTERVENTION:  1. Ensure Enlive po BID, each supplement provides 350 kcal and 20 grams of protein  NUTRITION DIAGNOSIS:   Inadequate oral intake related to poor appetite, nausea, acute illness as evidenced by per patient/family report.  GOAL:   Patient will meet greater than or equal to 90% of their needs  MONITOR:   PO intake, I & O's, Labs, Supplement acceptance, Weight trends  REASON FOR ASSESSMENT:   Malnutrition Screening Tool    ASSESSMENT:   71 yo female PMH of CHF, DM, GERD, CAD, presents with syncopal episodes.  Spoke with Meghan [name] at bedside. She reports on and off appetite over the past month. States she doesn't eat much because she is nauseated. Usual PO intake over the past month has been: oatmeal, milk and juice with some occasional fruit for breakfast. She states she can order food for lunch but may not eat it (comes from [facility]). Oftentimes she orders potatoes and gravy or pinto beans. Reports that she always consumes fruit or yogurt, and tea or milk. Eats about the same for supper. Normally drinks ensure once or twice a day. Reports a 10 pound/7.4% severe weight loss over 1 month, but does not indicate any other clear signs of malnutrition at this time.  Ate some pancakes and yogurt for breakfast this morning. 90% meal completion so far.  Encouraged her to consume ensure when she is not hungry,  Nutrition-Focused physical exam completed. Findings are no fat depletion, moderate muscle depletion at patella and calves, and no edema.   Labs reviewed:  K 3.1, BNP 1286  Medications reviewed and include:  Novolog 0-9 Units TID, Remeron, Zofran  Diet Order:  Diet Carb Modified Fluid consistency: Thin; Room service appropriate? Yes  Skin:  Wound (see comment) (Bilateral Breast Ecchymosis)  Last BM:  05/06/2017  Height:   Ht Readings from Last 1 Encounters:  05/05/17 4'  8" (1.422 m)    Weight:   Wt Readings from Last 1 Encounters:  05/05/17 124 lb 12.8 oz (56.6 kg)    Ideal Body Weight:  36.36 kg  BMI:  Body mass index is 27.98 kg/m.  Estimated Nutritional Needs:   Kcal:  1200-1400 calories  Protein:  68-79 grams (1.2-1.4g/kg)  Fluid:  >1.5L  EDUCATION NEEDS:   Education needs addressed  Satira Anis. Meghan Spillman, MS, RD LDN Inpatient Clinical Dietitian Pager [phone]

## 2017-05-06 NOTE — Progress Notes (Signed)
Chaplain responded to a consult for Advanced Directives. Johnson visited pt, but pt unavailable.Pt's nurse states that pt gone to ultrasound. [place] to follow up with pt as needed to provided education on AD.    05/06/17 1000  Clinical Encounter Type  Visited With Patient  Visit Type Initial  Referral From Nurse  Consult/Referral To Chaplain  Spiritual Encounters  Spiritual Needs Literature

## 2017-05-06 NOTE — Discharge Instructions (Signed)
[facility] Failure [facility] appointment on May 13 2017 at 9:00am with Darylene Price, [place]. Please call [phone] to reschedule.

## 2017-05-06 NOTE — Progress Notes (Signed)
PT Hold Note  Patient Details Name: Meghan [name] MRN: [id number] DOB: 1945/09/27   Evaluation Hold:    Reason Eval/Treat Not Completed: Medical issues which prohibited therapy. Order received and chart reviewed. K+ currently 2.5 which is contraindicated for PT evaluation per rehab policy. Pt is on continuous cardiac monitoring. Potassium is currently being repleated. Will hold PT evaluation until K+ is within acceptable range. PT evaluation will be performed at later time/date as appropriate.  Lyndel Safe Huprich PT, DPT   Huprich,Jason 05/06/2017, 1:43 PM

## 2017-05-06 NOTE — Progress Notes (Signed)
[facility] [place] NAME: Meghan [name]    MR#:  [id number]  DATE OF BIRTH:  Jun 07, 1946  SUBJECTIVE:  CHIEF COMPLAINT:   Chief Complaint  Patient presents with  . Loss of Consciousness  . Nausea   Came with dizziness, have some diarrhea also. Found to have low K and Mg.  REVIEW OF SYSTEMS:  CONSTITUTIONAL: No fever, fatigue or weakness.  EYES: No blurred or double vision.  EARS, NOSE, AND THROAT: No tinnitus or ear pain.  RESPIRATORY: No cough, shortness of breath, wheezing or hemoptysis.  CARDIOVASCULAR: No chest pain, orthopnea, edema.  GASTROINTESTINAL: No nausea, vomiting, diarrhea or abdominal pain.  GENITOURINARY: No dysuria, hematuria.  ENDOCRINE: No polyuria, nocturia,  HEMATOLOGY: No anemia, easy bruising or bleeding SKIN: No rash or lesion. MUSCULOSKELETAL: No joint pain or arthritis.   NEUROLOGIC: No tingling, numbness, weakness.  PSYCHIATRY: No anxiety or depression.   ROS  DRUG ALLERGIES:   Allergies  Allergen Reactions  . Gabapentin Other (See Comments)    Pt states that it causes her BP to drop.   . Naproxen Hives  . Nsaids Other (See Comments)    Reaction:  Unknown   . Tramadol Itching    VITALS:  Blood pressure (!) 97/52, pulse 82, temperature 98.5 F (36.9 C), temperature source Oral, resp. rate 18, height 4\' 8"  (1.422 m), weight 56.6 kg (124 lb 12.8 oz), SpO2 99 %.  PHYSICAL EXAMINATION:  GENERAL:  71 y.o.-year-old patient lying in the bed with no acute distress.  EYES: Pupils equal, round, reactive to light and accommodation. No scleral icterus. Extraocular muscles intact.  HEENT: Head atraumatic, normocephalic. Oropharynx and nasopharynx clear.  NECK:  Supple, no jugular venous distention. No thyroid enlargement, no tenderness.  LUNGS: Normal breath sounds bilaterally, no wheezing, rales,rhonchi or crepitation. No use of accessory muscles of respiration.  CARDIOVASCULAR: S1, S2 normal. No murmurs, rubs,  or gallops.  ABDOMEN: Soft, nontender, nondistended. Bowel sounds present. No organomegaly or mass.  EXTREMITIES: No pedal edema, cyanosis, or clubbing.  NEUROLOGIC: Cranial nerves II through XII are intact. Muscle strength 4- 5/5 in all extremities. Sensation intact. Gait not checked.  PSYCHIATRIC: The patient is alert and oriented x 3.  SKIN: No obvious rash, lesion, or ulcer.   Physical Exam LABORATORY PANEL:   CBC  Recent Labs Lab 05/06/17 0948  WBC 3.6  HGB 12.5  HCT 36.1  PLT 220   ------------------------------------------------------------------------------------------------------------------  Chemistries   Recent Labs Lab 05/05/17 1412 05/06/17 0948  NA 141 141  K 3.1* 2.5*  CL 108 105  CO2 21* 26  GLUCOSE 87 130*  BUN 7 5*  CREATININE 0.52 0.51  CALCIUM 8.7* 8.0*  MG  --  1.4*  AST 23  --   ALT 10*  --   ALKPHOS 73  --   BILITOT 1.0  --    ------------------------------------------------------------------------------------------------------------------  Cardiac Enzymes  Recent Labs Lab 05/06/17 0216 05/06/17 0948  TROPONINI <0.03 <0.03   ------------------------------------------------------------------------------------------------------------------  RADIOLOGY:  US Carotid Bilateral  Result Date: 05/06/2017 CLINICAL DATA:  Syncope. EXAM: BILATERAL CAROTID DUPLEX ULTRASOUND TECHNIQUE: Pearline Cables scale imaging, color Doppler and duplex ultrasound were performed of bilateral carotid and vertebral arteries in the neck. COMPARISON:  CT 03/02/2017 . FINDINGS: Criteria: Quantification of carotid stenosis is based on velocity parameters that correlate the residual internal carotid diameter with NASCET-based stenosis levels, using the diameter of the distal internal carotid lumen as the denominator for stenosis measurement. The following velocity measurements  were obtained: RIGHT ICA:  135/33 cm/sec CCA:  161/09 cm/sec SYSTOLIC ICA/CCA RATIO:  1.2 DIASTOLIC  ICA/CCA RATIO:  2.6 ECA:  145 cm/sec LEFT ICA:  89/16 cm/sec CCA:  60/45 cm/sec SYSTOLIC ICA/CCA RATIO:  0.9 DIASTOLIC ICA/CCA RATIO:  1.6 ECA:  102 cm/sec RIGHT CAROTID ARTERY: Mild right carotid bifurcation atherosclerotic vascular plaque. No flow limiting stenosis. RIGHT VERTEBRAL ARTERY:  Patent with antegrade flow. LEFT CAROTID ARTERY: Mild left carotid bifurcation atherosclerotic vascular plaque. No flow limiting stenosis. LEFT VERTEBRAL ARTERY:  Patent with antegrade flow. IMPRESSION: 1. Mild bilateral carotid atherosclerotic vascular plaque. No flow limiting stenosis. Degree of stenosis less than 50% bilaterally. 2. Vertebrals are patent antegrade flow. Electronically Signed   By: Marcello Moores  Register   On: 05/06/2017 10:39   Dg Chest Portable 1 View  Result Date: 05/05/2017 CLINICAL DATA:  Pt in via EMS from home with c/o syncope. EMS reports pt was here for MI 3 weeks ago and just got home from rehab on Monday. EMS reports per pt she woke up twice and felt like she had passed out both times. EMS reports pt was diaphoretic upon arrival. Pt denies pain at this time but reports feels nauseated and has had some diarrhea. EMS reports FBSB 95, BP 124/80. Pt denies CP, SOB at this time. States she had diarrhea and feels nauseated EXAM: PORTABLE CHEST 1 VIEW COMPARISON:  05/04/2017 FINDINGS: Cardiac silhouette is mildly enlarged. No mediastinal or hilar masses. There is opacity at both lung bases obscuring hemidiaphragms consistent with a combination of pleural effusions with either atelectasis, pneumonia or a combination. There is no evidence of pulmonary edema. Remainder of the lungs is clear. No pneumothorax. Skeletal structures are demineralized but grossly intact. IMPRESSION: 1. Bilateral lung base opacity consistent with a combination of pleural effusions and atelectasis. Pneumonia is possible. Lung base opacity is mildly increased from the prior exam, most evident on the right. 2. No pulmonary edema.  Electronically Signed   By: Lajean Manes M.D.   On: 05/05/2017 15:07    ASSESSMENT AND PLAN:   Active Problems:   Syncope  1. Syncope.    unwitnessed. She's not orthostatic. Do not have any suspicion of seizure activity. suspicious of cardiac arrhythmia based on her medical history.   telemetry.    physical therapy    Consult cardiology, Carotid doppler   2. Hypokalemia. ypomagnesemia  oral repletion. IV magnesium. 3. Congestive heart failure. On a mild elevation in BNP. However she does have very mild pleural effusions and feels short of breath. We'll go ahead and put her on some diuretics by IV for the next 24 hours to see if this improves symptoms.  4. Coronary artery disease. EKG looks normal.    cycle her enzymes. 5. Diabetes. Continue her current medications and add sliding scale.   All the records are reviewed and case discussed with Care Management/Social Workerr. Management plans discussed with the patient, family and they are in agreement.  CODE STATUS: full.  TOTAL TIME TAKING CARE OF THIS PATIENT: 35 minutes.     POSSIBLE D/C IN 1-2 DAYS, DEPENDING ON CLINICAL CONDITION.   Vaughan Basta M.D on 05/06/2017   Between 7am to 6pm - Pager - [phone]  After 6pm go to [email or web address] - password [email or web address] [facility]  Office  [phone]  CC: Primary care physician; Lavera Guise, MD  Note: This dictation was prepared with Dragon dictation along with smaller phrase technology. Any transcriptional errors that result from this process  are unintentional.

## 2017-05-06 NOTE — Care Management (Addendum)
Patient discharged from [facility] 9/24 with goals met. A referral was made to [facility] for SN PT OT and Aide.  Patient has verbalized concerns that she has issues with transportation which interferes with her getting to MD appointments and obtaining her meds from the drug store.  Notified agency of admission and if able to discharge home will need to add social work for transportation issues.  CM speak with patient about a pharmacy that can deliver meds to the home. Will also make [facility] referral

## 2017-05-06 NOTE — Consult Note (Signed)
[facility] Card[facility]  CARDIOLOGY CONSULT NOTE  Patient ID: Meghan [name] MRN: [id number] DOB/AGE: [birth year] 71 y.o.  Admit date: 05/05/2017 Referring Physician Marcille Blanco Primary Physician Lavera Guise, MD  Primary Cardiologist [facility] Reason for Consultation Syncope  HPI: 71 year old female referred for evaluation of syncope. The patient has a history of chronic systolic heart failure, COPD, hypertension, hyperlipidemia, type II diabetes, collagen vascular disease, and rheumatoid arthritis. The patient has been admitted multiple times in the last several months for acute on chronic CHF with respiratory distress, as well as loss of consciousness, sepsis, persistent hypoxia requiring intubation. The patient is currently residing in a rehab facility. The patient reports being in her usual state of health yesterday morning when she was walking back to her bed from the bathroom, and started to feel generalized weakness. She got in bed and felt as if she was "leaving," and proceeded to pass out. This was unwitnessed. When she regained consciousness, she was diaphoretic and felt weak with mild shortness of breath and heart racing. She reports that about 15 minutes later while lying in bed, she again felt as if she was "leaving" and did pass out. She denies having experienced chest pain. When he granddaughter came to visit later, she informed her of what happened, and she was taken to [facility] ER for further evaluation. Admission labs notable for negative troponin x 3, and K 3.1. ECG revealed sinus rhythm at a rate of 98 bpm with known LBBB without acute changes. Of note, 2D echocardiogram on 03/21/17 revealed severely decreased LV function with LVEF 20-25%, with moderate MR. On this admission, the patient came to the ER unresponsive, septic, and was intubated. Previous echocardiogram in 08/2016 revealed LVEF 50%. Patient underwent a nuclear stress test 08/17/16, which revealed normal LV function without evidence of scar or  ischemia. Currently, the patient states she feels weak, but denies chest pain, shortness of breath, or palpitations. The patient states that up until July 2018 when she had a fall, she had been in relatively good health, living an independent lifestyle. Since then, however, the patient now uses a walker, does not drive, and does not do any of her errands.  Review of systems complete and found to be negative unless listed above     Past Medical History:  Diagnosis Date  . Anginal pain ([place])   . Anxiety   . Arthritis    RA  . Asthma   . Brain tumor (benign) ([place])   . CHF (congestive heart failure) ([place])   . Collagen vascular disease ([place])   . COPD (chronic obstructive pulmonary disease) ([place])   . Coronary artery disease   . DDD (degenerative disc disease)   . Depression   . Diabetes mellitus   . GERD (gastroesophageal reflux disease)   . Headache   . Heart murmur   . Heart murmur   . Hypercholesteremia   . Hypertension   . Lumbar degenerative disc disease   . Migraines   . Obese   . Obesity   . Osteopenia   . Osteoporosis   . Pneumonia   . PTSD (post-traumatic stress disorder)     Past Surgical History:  Procedure Laterality Date  . ABDOMINAL HYSTERECTOMY    . CERVICAL FUSION    . CHOLECYSTECTOMY    . COLONOSCOPY WITH PROPOFOL N/A 09/20/2015   Procedure: COLONOSCOPY WITH PROPOFOL;  Surgeon: Josefine Class, MD;  Location: [facility] ENDOSCOPY;  Service: Endoscopy;  Laterality: N/A;  . COLONOSCOPY WITH PROPOFOL N/A 01/27/2016  Procedure: COLONOSCOPY WITH PROPOFOL;  Surgeon: Manya Silvas, MD;  Location: [facility] ENDOSCOPY;  Service: Endoscopy;  Laterality: N/A;  . FINGER ARTHROPLASTY  02/23/2012   Procedure: FINGER ARTHROPLASTY;  Surgeon: Cammie Sickle., MD;  Location: [place];  Service: Orthopedics;  Laterality: Left;  Extensor carpi radialis longus to Extensor carpi ulnaris transfer, left Metaphalangeal reconstructions of index and long fingers,  . FOOT  ARTHROPLASTY     toes x2 rt foot  . HAND RECONSTRUCTION  2011   right-multiple finger joint reconst  . JOINT REPLACEMENT     bilat knee replacements    Prescriptions Prior to Admission  Medication Sig Dispense Refill Last Dose  . busPIRone (BUSPAR) 7.5 MG tablet Take 1 tablet by mouth 2 (two) times daily.   05/05/2017 at 0800  . carvedilol (COREG) 3.125 MG tablet Take 1 tablet (3.125 mg total) by mouth 2 (two) times daily with a meal. 60 tablet 0 05/05/2017 at 0800  . escitalopram (LEXAPRO) 10 MG tablet Take 1 tablet (10 mg total) by mouth every morning. 90 tablet 1 05/05/2017 at 0800  . feeding supplement, ENSURE ENLIVE, (ENSURE ENLIVE) LIQD Take 237 mLs by mouth 2 (two) times daily between meals. 237 mL 12 05/05/2017 at 0800  . furosemide (LASIX) 40 MG tablet Take 0.5 tablets (20 mg total) by mouth daily. 30 tablet 0 05/05/2017 at 0800  . lamoTRIgine (LAMICTAL) 100 MG tablet Take 1 tablet (100 mg total) by mouth daily. 90 tablet 3 05/05/2017 at 0800  . lisinopril (PRINIVIL,ZESTRIL) 2.5 MG tablet Take 1 tablet by mouth 2 (two) times daily.   05/05/2017 at 0800  . mirtazapine (REMERON) 7.5 MG tablet Take 7.5 mg by mouth at bedtime.    05/05/2017 at 0800  . omeprazole (PRILOSEC) 20 MG capsule Take 20 mg by mouth at bedtime.   05/05/2017 at 0800  . potassium chloride 20 MEQ TBCR Take 20 mEq by mouth daily. 30 tablet 0 05/05/2017 at 0800  . pramipexole (MIRAPEX) 0.25 MG tablet Take 0.25 mg by mouth daily.    05/05/2017 at 0800  . pregabalin (LYRICA) 50 MG capsule TAKE ONE CAPSULE BY MOUTH TWICE A DAY   05/05/2017 at 0800  . saxagliptin HCl (ONGLYZA) 5 MG TABS tablet Take 5 mg by mouth at bedtime.   05/05/2017 at 0800  . topiramate (TOPAMAX) 25 MG tablet Take 25 mg by mouth 2 (two) times daily.   05/05/2017 at 0800  . budesonide (PULMICORT) 0.5 MG/2ML nebulizer solution Take 2 mLs (0.5 mg total) by nebulization 2 (two) times daily. 2 mL 12 prn at prn  . diphenoxylate-atropine (LOMOTIL) 2.5-0.025 MG tablet Take  1 tablet by mouth 3 (three) times daily as needed for diarrhea or loose stools.   prn at prn  . insulin starter kit- pen needles MISC 1 kit by Other route once. 1 kit 0 02/22/2017 at Unknown time  . ipratropium-albuterol (DUONEB) 0.5-2.5 (3) MG/3ML SOLN Take 3 mLs by nebulization 2 (two) times daily. 360 mL 0 prn at prn  . ondansetron (ZOFRAN) 4 MG tablet Take 4 mg by mouth every 3 (three) hours as needed for nausea or vomiting.    prn at prn   Social History   Social History  . Marital status: Divorced    Spouse name: N/A  . Number of children: N/A  . Years of education: N/A   Occupational History  . retired    Social History Main Topics  . Smoking status: Never Smoker  .  Smokeless tobacco: Never Used  . Alcohol use No  . Drug use: No  . Sexual activity: No   Other Topics Concern  . Not on file   Social History Narrative  . No narrative on file    Family History  Problem Relation Age of Onset  . Depression Sister   . Hypertension Mother   . Arthritis/Rheumatoid Mother   . Heart attack Father   . Hypertension Father   . CAD Unknown   . Hypertension Unknown   . Diabetes Mellitus II Unknown   . Arthritis Unknown       Review of systems complete and found to be negative unless listed above      PHYSICAL EXAM  General: Elderly lady, resting in bed, chronically ill-appearing, in no acute distress, currently receiving nebulizer treatment HEENT:  Normocephalic and atramatic Neck:  No JVD.  Lungs: Normal effort of breathing. Faint bibasilar crackles. No wheezing. Heart: HRRR .  Abdomen: Bowel sounds are positive, abdomen soft and non-tender  Msk:  Back normal. Able to roll to side. Gait not assessed. Extremities: No clubbing, cyanosis or edema.   Neuro: Alert and oriented X 3. Psych:  Good affect, responds appropriately  Labs:   Lab Results  Component Value Date   WBC 4.9 05/05/2017   HGB 12.3 05/05/2017   HCT 35.6 05/05/2017   MCV 87.7 05/05/2017   PLT 236  05/05/2017    Recent Labs Lab 05/05/17 1412  NA 141  K 3.1*  CL 108  CO2 21*  BUN 7  CREATININE 0.52  CALCIUM 8.7*  PROT 5.4*  BILITOT 1.0  ALKPHOS 73  ALT 10*  AST 23  GLUCOSE 87   Lab Results  Component Value Date   CKTOTAL 95 11/29/2012   CKMB 2.6 11/29/2012   TROPONINI <0.03 05/06/2017    Lab Results  Component Value Date   CHOL 148 08/01/2012   Lab Results  Component Value Date   HDL 47 08/01/2012   Lab Results  Component Value Date   LDLCALC 80 08/01/2012   Lab Results  Component Value Date   TRIG 103 08/01/2012   No results found for: CHOLHDL No results found for: LDLDIRECT    Radiology: Dg Chest 2 View  Result Date: 04/17/2017 CLINICAL DATA:  CHF EXAM: CHEST  2 VIEW COMPARISON:  04/16/2017 FINDINGS: Right lower lobe consolidation unchanged. Improvement in right upper lobe airspace disease. Improved aeration in the left lung base with improvement in left lower lobe atelectasis/infiltrate and small left effusion. Negative for edema. IMPRESSION: Interval improvement in right upper lobe and left lower lobe airspace disease. No change in extensive right lower lobe consolidation and effusion. Electronically Signed   By: Franchot Gallo M.D.   On: 04/17/2017 08:56   Dg Chest Portable 1 View  Result Date: 05/05/2017 CLINICAL DATA:  Pt in via EMS from home with c/o syncope. EMS reports pt was here for MI 3 weeks ago and just got home from rehab on Monday. EMS reports per pt she woke up twice and felt like she had passed out both times. EMS reports pt was diaphoretic upon arrival. Pt denies pain at this time but reports feels nauseated and has had some diarrhea. EMS reports FBSB 95, BP 124/80. Pt denies CP, SOB at this time. States she had diarrhea and feels nauseated EXAM: PORTABLE CHEST 1 VIEW COMPARISON:  05/04/2017 FINDINGS: Cardiac silhouette is mildly enlarged. No mediastinal or hilar masses. There is opacity at both lung bases obscuring hemidiaphragms  consistent  with a combination of pleural effusions with either atelectasis, pneumonia or a combination. There is no evidence of pulmonary edema. Remainder of the lungs is clear. No pneumothorax. Skeletal structures are demineralized but grossly intact. IMPRESSION: 1. Bilateral lung base opacity consistent with a combination of pleural effusions and atelectasis. Pneumonia is possible. Lung base opacity is mildly increased from the prior exam, most evident on the right. 2. No pulmonary edema. Electronically Signed   By: Lajean Manes M.D.   On: 05/05/2017 15:07   Dg Chest Portable 1 View  Result Date: 05/04/2017 CLINICAL DATA:  Dyspnea EXAM: PORTABLE CHEST 1 VIEW COMPARISON:  04/17/2017 FINDINGS: Low volume chest with greater elevation of the right diaphragm. Streaky opacities at the bases. Probable small pleural effusions. Normal heart size. Stable mediastinal contours. No edema or pneumothorax. IMPRESSION: Low volume chest with atelectasis and small effusions at the bases, stable from 04/17/2017. Superimposed infection could be obscured. Electronically Signed   By: Monte Fantasia M.D.   On: 05/04/2017 07:06   Dg Chest Portable 1 View  Result Date: 04/16/2017 CLINICAL DATA:  Pt found unresponsive and in respiratory failure today. Hx of asthma, brain tumor, CHF, COPD, CAD, diabetes, heart murmur, hypertension, pneumonia. Non smoker EXAM: PORTABLE CHEST 1 VIEW COMPARISON:  03/22/2017 FINDINGS: There is significant dense opacity at the right lung base, likely indicating pleural effusion and consolidation or significant atelectasis, or portion of which is ongoing. There is a small left pleural effusion. The heart is mildly enlarged. There are interstitial changes consistent with mild edema. Mild patchy opacity in the lung bases could represent edema and/or infectious process. Right IJ line has been removed. IMPRESSION: 1. Persistent/significant opacity at the right lung base compatible with pleural effusion and atelectasis  or infiltrate. 2. Interstitial pulmonary edema. 3. Bilateral lower lobe patchy of consistent with edema and/or infectious process. Electronically Signed   By: Nolon Nations M.D.   On: 04/16/2017 07:56    EKG: Sinus rhythm, rate 89 bpm  ASSESSMENT AND PLAN:  1. Syncope, unwitnessed with prodromal symptoms 2. 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  3. Essential hypertension, blood pressure well controlled  4. Type II diabetes  Recommendations: 1. Agree with overall current therapy. 2. Continue carvedilol, lisinopril, and Lasix. 3. Continue to monitor on telemetry 4. Carotid ultrasound 5. Hesitant to proceed with nuclear stress test or cardiac catheterization for further evaluation of relatively new cardiomyopathy as patient may not be a good candidate for PCI with subsequent anticoagulation as she is a falls risk, or bypass if required.  6. If patient continues to remain stable today, would consider discharge tomorrow with order for 30-day Loop monitor, with or without Life Vest. Will need to discuss further with family to determine if patient is a good candidate for ICD.  7. Recommend repeating 2D echocardiogram in 3 months.  Signed: Clabe Seal PA-C 05/06/2017, 8:01 AM

## 2017-05-06 NOTE — Progress Notes (Signed)
OT Cancellation Note  Patient Details Name: Meghan [name] MRN: [id number] DOB: [birth year]   Cancelled Treatment:    Reason Eval/Treat Not Completed: Medical issues which prohibited therapy. Order received, chart reviewed. K+ currently 2.5. Pt contraindicated for OT evaluation per rehab policy. Pt is on continuous cardiac monitoring. Potassium is currently being repleated. Will hold OT evaluation until K+ is within acceptable range. Will re-attempt OT evaluation at later time/date as medically appropriate.  Jeni Salles, MPH, MS, OTR/L ascom [phone] 05/06/17, 4:27 PM

## 2017-05-07 DIAGNOSIS — I5023 Acute on chronic systolic (congestive) heart failure: Secondary | ICD-10-CM | POA: Diagnosis not present

## 2017-05-07 DIAGNOSIS — R55 Syncope and collapse: Secondary | ICD-10-CM | POA: Diagnosis not present

## 2017-05-07 DIAGNOSIS — Z8679 Personal history of other diseases of the circulatory system: Secondary | ICD-10-CM | POA: Diagnosis not present

## 2017-05-07 DIAGNOSIS — E876 Hypokalemia: Secondary | ICD-10-CM | POA: Diagnosis not present

## 2017-05-07 DIAGNOSIS — I2510 Atherosclerotic heart disease of native coronary artery without angina pectoris: Secondary | ICD-10-CM | POA: Diagnosis not present

## 2017-05-07 LAB — BASIC METABOLIC PANEL
ANION GAP: 5 (ref 5–15)
BUN: 8 mg/dL (ref 6–20)
CO2: 27 mmol/L (ref 22–32)
Calcium: 7.8 mg/dL — ABNORMAL LOW (ref 8.9–10.3)
Chloride: 109 mmol/L (ref 101–111)
Creatinine, Ser: 0.72 mg/dL (ref 0.44–1.00)
GFR calc Af Amer: >60 mL/min (ref >60)
Glucose, Bld: 102 mg/dL — ABNORMAL HIGH (ref 65–99)
POTASSIUM: 3.2 mmol/L — AB (ref 3.5–5.1)
SODIUM: 141 mmol/L (ref 135–145)

## 2017-05-07 LAB — GLUCOSE, CAPILLARY
GLUCOSE-CAPILLARY: 129 mg/dL — AB (ref 65–99)
Glucose-Capillary: 93 mg/dL (ref 65–99)

## 2017-05-07 LAB — MAGNESIUM: MAGNESIUM: 2.1 mg/dL (ref 1.7–2.4)

## 2017-05-07 MED ORDER — POTASSIUM CHLORIDE ER 20 MEQ PO TBCR: 20.0000 meq | EXTENDED_RELEASE_TABLET | Freq: Two times a day (BID) | ORAL | 0 refills | Status: DC

## 2017-05-07 NOTE — Evaluation (Signed)
Physical Therapy Evaluation Patient Details Name: CAMBELL RICKENBACH MRN: [id number] DOB: Aug 13, 1945 Today's Date: 05/07/2017   History of Present Illness  71 y.o. female with a known history of CHF, COPD, collagen vascular disease, coronary artery disease, diabetes, recurrent admissions for CHF/PNA in last few month.  Last admit she was noted to be septic with tachycardia, hypoxia and elevated white blood cell count.  She then returned to rehab and had since discharged back home.  Pt is now back after 2 syncopal events.   Clinical Impression  Pt showed good mobility and ability to get to standing w/o assist.  She also did well with ~120 ft of ambulation using walker and needing only CGA and directional cuing.  She showed some minor fatigue but O2 remained in the 90s (BP was 123/78 in sitting before getting up).  She showed safe in-home ambulation distances but is not at her baseline.  Pt agrees that HHPT is the best plan moving forward.    Follow Up Recommendations Home health PT    Equipment Recommendations  None recommended by PT    Recommendations for Other Services       Precautions / Restrictions Precautions Precautions: Fall Restrictions Weight Bearing Restrictions: No      Mobility  Bed Mobility Overal bed mobility: Modified Independent Bed Mobility: Supine to Sit     Supine to sit: Min guard     General bed mobility comments: heavy use of rails, but able to rise w/o assist  Transfers Overall transfer level: Modified independent Equipment used: Rolling walker (2 wheeled) Transfers: Sit to/from Stand Sit to Stand: Min assist         General transfer comment: Pt was able to rise w/o assist showing good confidence and safety  Ambulation/Gait Ambulation/Gait assistance: Supervision Ambulation Distance (Feet): 120 Feet Assistive device: Rolling walker (2 wheeled)       General Gait Details: Pt was able to ambulate with good confidence, showed appropriate walker  control and did not have excessive fatigue.  Pt reports she is still not at her baseline but showed good in-home distance competance.  Stairs            Wheelchair Mobility    Modified Rankin (Stroke Patients Only)       Balance Overall balance assessment: Modified Independent                                           Pertinent Vitals/Pain Pain Assessment: No/denies pain    Home Living Family/patient expects to be discharged to:: Private residence Living Arrangements: Alone Available Help at Discharge: Neighbor;Available PRN/intermittently Type of Home: House Home Access: Stairs to enter Entrance Stairs-Rails: Can reach both Entrance Stairs-Number of Steps: 3   Home Equipment: [place] - 2 wheels;Shower seat;Grab bars - tub/shower      Prior Function Level of Independence: Independent with assistive device(s)         Comments: Pt reports she has not been out of the house much since completing rehab, but normally is able to be somewhat active in the community     Hand Dominance        Extremity/Trunk Assessment   Upper Extremity Assessment Upper Extremity Assessment: Overall WFL for tasks assessed    Lower Extremity Assessment Lower Extremity Assessment: Overall WFL for tasks assessed       Communication   Communication: No difficulties  Cognition Arousal/Alertness: Awake/alert Behavior During Therapy: WFL for tasks assessed/performed Overall Cognitive Status: No family/caregiver present to determine baseline cognitive functioning                                        General Comments      Exercises     Assessment/Plan    PT Assessment Patient needs continued PT services  PT Problem List Decreased strength;Decreased activity tolerance;Decreased balance;Decreased mobility;Decreased knowledge of use of DME;Decreased safety awareness       PT Treatment Interventions DME instruction;Gait training;Stair  training;Therapeutic activities;Therapeutic exercise;Balance training;Neuromuscular re-education;Patient/family education    PT Goals (Current goals can be found in the Care Plan section)  Acute Rehab PT Goals Patient Stated Goal: Return to prior function and be safe at home PT Goal Formulation: With patient Time For Goal Achievement: 05/21/17 Potential to Achieve Goals: Good    Frequency Min 2X/week   Barriers to discharge        Co-evaluation               AM-PAC PT "6 Clicks" Daily Activity  Outcome Measure Difficulty turning over in bed (including adjusting bedclothes, sheets and blankets)?: A Little Difficulty moving from lying on back to sitting on the side of the bed? : A Little Difficulty sitting down on and standing up from a chair with arms (e.g., wheelchair, bedside commode, etc,.)?: A Little Help needed moving to and from a bed to chair (including a wheelchair)?: None Help needed walking in hospital room?: None Help needed climbing 3-5 steps with a railing? : A Little 6 Click Score: 20    End of Session Equipment Utilized During Treatment: Gait belt Activity Tolerance: Patient tolerated treatment well Patient left: in chair;with call bell/phone within reach;with chair alarm set Nurse Communication: Mobility status PT Visit Diagnosis: Unsteadiness on feet (R26.81);Muscle weakness (generalized) (M62.81)    Time: 4742-5956 PT Time Calculation (min) (ACUTE ONLY): 17 min   Charges:   PT Evaluation $PT Eval Low Complexity: 1 Low     PT G Codes:   PT G-Codes **NOT FOR INPATIENT CLASS** Functional Assessment Tool Used: AM-PAC 6 Clicks Basic Mobility Functional Limitation: Mobility: Walking and moving around Mobility: Walking and Moving Around Current Status (L8756): At least 20 percent but less than 40 percent impaired, limited or restricted Mobility: Walking and Moving Around Goal Status [phone]): At least 1 percent but less than 20 percent impaired, limited  or restricted    Kreg Shropshire, DPT 05/07/2017, 9:52 AM

## 2017-05-07 NOTE — Progress Notes (Signed)
OT Cancellation Note  Patient Details Name: Meghan [name] MRN: [id number] DOB: 07-Sep-1945   Cancelled Treatment:    Reason Eval/Treat Not Completed: Patient at procedure or test/ unavailable (OT initial visit attemtped x 2 this a.m. Pt. was unavaible each attempt. Will attempt this afternoon.)  Harrel Carina, MS, OTR/L 05/07/2017, 12:01 PM

## 2017-05-07 NOTE — Discharge Summary (Signed)
Meghan [name] at [place] NAME: Meghan [name]    MR#:  [id number]  DATE OF BIRTH:  1945/09/10  DATE OF ADMISSION:  05/05/2017 ADMITTING PHYSICIAN: Baxter Hire, MD  DATE OF DISCHARGE: 05/07/2017  PRIMARY CARE PHYSICIAN: Lavera Guise, MD    ADMISSION DIAGNOSIS:  Syncope and collapse [R55]  DISCHARGE DIAGNOSIS:  Active Problems:   Syncope    Hypokalemia  SECONDARY DIAGNOSIS:   Past Medical History:  Diagnosis Date  . Anginal pain ([place])   . Anxiety   . Arthritis    RA  . Asthma   . Brain tumor (benign) ([place])   . CHF (congestive heart failure) ([place])   . Collagen vascular disease ([place])   . COPD (chronic obstructive pulmonary disease) ([place])   . Coronary artery disease   . DDD (degenerative disc disease)   . Depression   . Diabetes mellitus   . GERD (gastroesophageal reflux disease)   . Headache   . Heart murmur   . Heart murmur   . Hypercholesteremia   . Hypertension   . Lumbar degenerative disc disease   . Migraines   . Obese   . Obesity   . Osteopenia   . Osteoporosis   . Pneumonia   . PTSD (post-traumatic stress disorder)     HOSPITAL COURSE:   1. Syncope.    unwitnessed. She's not orthostatic. Do not have any suspicion of seizure activity. suspicious of cardiac arrhythmia based on her medical history.   telemetry.    physical therapy    Appreciated Consult cardiology, Carotid doppler are negative for significant blockage.   Cardiology suggest may need loop recorder as out pt.   She have significant drop in EF, so may need ischemia work up and need to check need for ICD- will arrange from cardiology clinic.  2. Hypokalemia. hypomagnesemia  oral repletion. IV magnesium. Improved. 3. Ac systolic Congestive heart failure. On a mild elevation in BNP. However she does have very mild pleural effusions and feels short of breath. We'll go ahead and put her on some diuretics by IV for the next 24 hours -  improved. 4. Coronary artery disease. EKG looks normal.    cycle her enzymes. 5. Diabetes. Continue her current medications and add sliding scale. 6. Diarrhea   Pt had c diff 2 months ago. Waited 24 hrs to get sample for c diff, no BM, no more diarrhea, so c.diff ruled out.  DISCHARGE CONDITIONS:   stable  CONSULTS OBTAINED:  Treatment Team:  Isaias Cowman, MD  DRUG ALLERGIES:   Allergies  Allergen Reactions  . Gabapentin Other (See Comments)    Pt states that it causes her BP to drop.   . Naproxen Hives  . Nsaids Other (See Comments)    Reaction:  Unknown   . Tramadol Itching    DISCHARGE MEDICATIONS:   Current Discharge Medication List    CONTINUE these medications which have CHANGED   Details  Potassium Chloride ER 20 MEQ TBCR Take 20 mEq by mouth 2 (two) times daily. Qty: 30 tablet, Refills: 0      CONTINUE these medications which have NOT CHANGED   Details  busPIRone (BUSPAR) 7.5 MG tablet Take 1 tablet by mouth 2 (two) times daily.    carvedilol (COREG) 3.125 MG tablet Take 1 tablet (3.125 mg total) by mouth 2 (two) times daily with a meal. Qty: 60 tablet, Refills: 0    escitalopram (LEXAPRO) 10 MG tablet Take  1 tablet (10 mg total) by mouth every morning. Qty: 90 tablet, Refills: 1    feeding supplement, ENSURE ENLIVE, (ENSURE ENLIVE) LIQD Take 237 mLs by mouth 2 (two) times daily between meals. Qty: 237 mL, Refills: 12    furosemide (LASIX) 40 MG tablet Take 0.5 tablets (20 mg total) by mouth daily. Qty: 30 tablet, Refills: 0    lamoTRIgine (LAMICTAL) 100 MG tablet Take 1 tablet (100 mg total) by mouth daily. Qty: 90 tablet, Refills: 3    lisinopril (PRINIVIL,ZESTRIL) 2.5 MG tablet Take 1 tablet by mouth 2 (two) times daily.    mirtazapine (REMERON) 7.5 MG tablet Take 7.5 mg by mouth at bedtime.     omeprazole (PRILOSEC) 20 MG capsule Take 20 mg by mouth at bedtime.    pramipexole (MIRAPEX) 0.25 MG tablet Take 0.25 mg by mouth daily.      pregabalin (LYRICA) 50 MG capsule TAKE ONE CAPSULE BY MOUTH TWICE A DAY    saxagliptin HCl (ONGLYZA) 5 MG TABS tablet Take 5 mg by mouth at bedtime.    topiramate (TOPAMAX) 25 MG tablet Take 25 mg by mouth 2 (two) times daily.    budesonide (PULMICORT) 0.5 MG/2ML nebulizer solution Take 2 mLs (0.5 mg total) by nebulization 2 (two) times daily. Qty: 2 mL, Refills: 12    diphenoxylate-atropine (LOMOTIL) 2.5-0.025 MG tablet Take 1 tablet by mouth 3 (three) times daily as needed for diarrhea or loose stools.    insulin starter kit- pen needles MISC 1 kit by Other route once. Qty: 1 kit, Refills: 0    ipratropium-albuterol (DUONEB) 0.5-2.5 (3) MG/3ML SOLN Take 3 mLs by nebulization 2 (two) times daily. Qty: 360 mL, Refills: 0    ondansetron (ZOFRAN) 4 MG tablet Take 4 mg by mouth every 3 (three) hours as needed for nausea or vomiting.          DISCHARGE INSTRUCTIONS:    Follow with cardiology clinic in 1 week.  If you experience worsening of your admission symptoms, develop shortness of breath, life threatening emergency, suicidal or homicidal thoughts you must seek medical attention immediately by calling 911 or calling your MD immediately  if symptoms less severe.  You Must read complete instructions/literature along with all the possible adverse reactions/side effects for all the Medicines you take and that have been prescribed to you. Take any new Medicines after you have completely understood and accept all the possible adverse reactions/side effects.   Please note  You were cared for by a hospitalist during your hospital stay. If you have any questions about your discharge medications or the care you received while you were in the hospital after you are discharged, you can call the unit and asked to speak with the hospitalist on call if the hospitalist that took care of you is not available. Once you are discharged, your primary care physician will handle any further medical  issues. Please note that NO REFILLS for any discharge medications will be authorized once you are discharged, as it is imperative that you return to your primary care physician (or establish a relationship with a primary care physician if you do not have one) for your aftercare needs so that they can reassess your need for medications and monitor your lab values.    Today   CHIEF COMPLAINT:   Chief Complaint  Patient presents with  . Loss of Consciousness  . Nausea    HISTORY OF PRESENT ILLNESS:  Meghan [name]  is a 71 y.o. female with  a known history of CHF and coronary artery disease. She states she passed out twice today. She says once while she was coming back from the bathroom. The second time while she was lying in bed. She said she got hot and flushed feeling. She said she also felt like this but didn't pass out when EMS picked her up. She called her son who called the home health nurse and they discussed on the phone and she was advised to come to the hospital. He does not have any chest pain with this she does feel short of breath associated felt that way ever since she was discharged from the hospital back couple months ago. Hospitalist services were consulted for admission.   VITAL SIGNS:  Blood pressure (!) 94/54, pulse 77, temperature 98.8 F (37.1 C), temperature source Oral, resp. rate (!) 22, height 4' 8" (1.422 m), weight 56 kg (123 lb 6.4 oz), SpO2 96 %.  I/O:   Intake/Output Summary (Last 24 hours) at 05/07/17 1325 Last data filed at 05/07/17 1126  Gross per 24 hour  Intake                0 ml  Output              301 ml  Net             -301 ml    PHYSICAL EXAMINATION:  GENERAL:  71 y.o.-year-old patient lying in the bed with no acute distress.  EYES: Pupils equal, round, reactive to light and accommodation. No scleral icterus. Extraocular muscles intact.  HEENT: Head atraumatic, normocephalic. Oropharynx and nasopharynx clear.  NECK:  Supple, no jugular venous  distention. No thyroid enlargement, no tenderness.  LUNGS: Normal breath sounds bilaterally, no wheezing, rales,rhonchi or crepitation. No use of accessory muscles of respiration.  CARDIOVASCULAR: S1, S2 normal. No murmurs, rubs, or gallops.  ABDOMEN: Soft, non-tender, non-distended. Bowel sounds present. No organomegaly or mass.  EXTREMITIES: No pedal edema, cyanosis, or clubbing.  NEUROLOGIC: Cranial nerves II through XII are intact. Muscle strength 4-5/5 in all extremities. Sensation intact. Gait not checked.  PSYCHIATRIC: The patient is alert and oriented x 3.  SKIN: No obvious rash, lesion, or ulcer.   DATA REVIEW:   CBC  Recent Labs Lab 05/06/17 0948  WBC 3.6  HGB 12.5  HCT 36.1  PLT 220    Chemistries   Recent Labs Lab 05/05/17 1412  05/07/17 0332  NA 141  < > 141  K 3.1*  < > 3.2*  CL 108  < > 109  CO2 21*  < > 27  GLUCOSE 87  < > 102*  BUN 7  < > 8  CREATININE 0.52  < > 0.72  CALCIUM 8.7*  < > 7.8*  MG  --   < > 2.1  AST 23  --   --   ALT 10*  --   --   ALKPHOS 73  --   --   BILITOT 1.0  --   --   < > = values in this interval not displayed.  Cardiac Enzymes  Recent Labs Lab 05/06/17 0948  TROPONINI <0.03    Microbiology Results  Results for orders placed or performed during the hospital encounter of 05/05/17  MRSA PCR Screening     Status: Abnormal   Collection Time: 05/06/17  7:50 AM  Result Value Ref Range Status   MRSA by PCR POSITIVE (A) NEGATIVE Final    Comment:  The GeneXpert MRSA Assay (FDA approved for NASAL specimens only), is one component of a comprehensive MRSA colonization surveillance program. It is not intended to diagnose MRSA infection nor to guide or monitor treatment for MRSA infections. RESULT CALLED TO, READ BACK BY AND VERIFIED WITH: JANCY JOHNSTONE AT 5885 ON 05/06/17 [place].     RADIOLOGY:  US Carotid Bilateral  Result Date: 05/06/2017 CLINICAL DATA:  Syncope. EXAM: BILATERAL CAROTID DUPLEX ULTRASOUND  TECHNIQUE: Pearline Cables scale imaging, color Doppler and duplex ultrasound were performed of bilateral carotid and vertebral arteries in the neck. COMPARISON:  CT 03/02/2017 . FINDINGS: Criteria: Quantification of carotid stenosis is based on velocity parameters that correlate the residual internal carotid diameter with NASCET-based stenosis levels, using the diameter of the distal internal carotid lumen as the denominator for stenosis measurement. The following velocity measurements were obtained: RIGHT ICA:  135/33 cm/sec CCA:  027/74 cm/sec SYSTOLIC ICA/CCA RATIO:  1.2 DIASTOLIC ICA/CCA RATIO:  2.6 ECA:  145 cm/sec LEFT ICA:  89/16 cm/sec CCA:  12/87 cm/sec SYSTOLIC ICA/CCA RATIO:  0.9 DIASTOLIC ICA/CCA RATIO:  1.6 ECA:  102 cm/sec RIGHT CAROTID ARTERY: Mild right carotid bifurcation atherosclerotic vascular plaque. No flow limiting stenosis. RIGHT VERTEBRAL ARTERY:  Patent with antegrade flow. LEFT CAROTID ARTERY: Mild left carotid bifurcation atherosclerotic vascular plaque. No flow limiting stenosis. LEFT VERTEBRAL ARTERY:  Patent with antegrade flow. IMPRESSION: 1. Mild bilateral carotid atherosclerotic vascular plaque. No flow limiting stenosis. Degree of stenosis less than 50% bilaterally. 2. Vertebrals are patent antegrade flow. Electronically Signed   By: Marcello Moores  Register   On: 05/06/2017 10:39   Dg Chest Portable 1 View  Result Date: 05/05/2017 CLINICAL DATA:  Pt in via EMS from home with c/o syncope. EMS reports pt was here for MI 3 weeks ago and just got home from rehab on Monday. EMS reports per pt she woke up twice and felt like she had passed out both times. EMS reports pt was diaphoretic upon arrival. Pt denies pain at this time but reports feels nauseated and has had some diarrhea. EMS reports FBSB 95, BP 124/80. Pt denies CP, SOB at this time. States she had diarrhea and feels nauseated EXAM: PORTABLE CHEST 1 VIEW COMPARISON:  05/04/2017 FINDINGS: Cardiac silhouette is mildly enlarged. No mediastinal  or hilar masses. There is opacity at both lung bases obscuring hemidiaphragms consistent with a combination of pleural effusions with either atelectasis, pneumonia or a combination. There is no evidence of pulmonary edema. Remainder of the lungs is clear. No pneumothorax. Skeletal structures are demineralized but grossly intact. IMPRESSION: 1. Bilateral lung base opacity consistent with a combination of pleural effusions and atelectasis. Pneumonia is possible. Lung base opacity is mildly increased from the prior exam, most evident on the right. 2. No pulmonary edema. Electronically Signed   By: Lajean Manes M.D.   On: 05/05/2017 15:07    EKG:   Orders placed or performed during the hospital encounter of 05/05/17  . EKG 12-Lead  . EKG 12-Lead  . ED EKG  . ED EKG      Management plans discussed with the patient, family and they are in agreement.  CODE STATUS:     Code Status Orders        Start     Ordered   05/05/17 2024  Full code  Continuous     05/05/17 2023    Code Status History    Date Active Date Inactive Code Status Order ID Comments User Context   04/16/2017 10:15 AM  04/19/2017  8:09 PM DNR [id number]  Vaughan Basta, MD Inpatient   03/20/2017  4:05 AM 03/24/2017  8:08 PM Full Code [id number]  Lance Coon, MD Inpatient   02/27/2017  5:39 AM 03/04/2017  6:14 PM Full Code [id number]  Saundra Shelling, MD Inpatient   02/22/2017  3:47 PM 02/26/2017 11:05 PM Full Code [id number]  Vaughan Basta, MD Inpatient   03/01/2015  9:12 PM 03/10/2015  8:17 PM Full Code [id number]  Demetrios Loll, MD Inpatient   12/24/2014  5:27 AM 12/25/2014  6:32 PM DNR [id number]  Lance Coon, MD Inpatient    Advance Directive Documentation     Most Recent Value  Type of Advance Directive  Living will  Pre-existing out of facility DNR order (yellow form or pink MOST form)  -  "MOST" Form in Place?  -      TOTAL TIME TAKING CARE OF THIS PATIENT: 35 minutes.    Vaughan Basta M.D on  05/07/2017 at 1:25 PM  Between 7am to 6pm - Pager - [phone]  After 6pm go to [email or web address] - password [email or web address]  Office  [phone]  CC: Primary care physician; Lavera Guise, MD   Note: This dictation was prepared with Dragon dictation along with smaller phrase technology. Any transcriptional errors that result from this process are unintentional.

## 2017-05-07 NOTE — Progress Notes (Signed)
[facility] Cardiology  SUBJECTIVE: Patient reports feeling somewhat better this morning. She denies recurrent presyncope, syncope, or lightheadedness. She has ambulated this morning with PT and reports doing well. She has exertional shortness of breath. She denies palpitations.   Vitals:   05/06/17 2100 05/07/17 0401 05/07/17 0800 05/07/17 0813  BP: (!) 95/50 (!) 96/48 129/73   Pulse: 79 86 (!) 104   Resp: 18 16 (!) 22   Temp: 98.5 F (36.9 C) 98.6 F (37 C) 98.8 F (37.1 C)   TempSrc: Oral  Oral   SpO2: 96% 92% 96% 98%  Weight:  56 kg (123 lb 6.4 oz)    Height:         Intake/Output Summary (Last 24 hours) at 05/07/17 1761 Last data filed at 05/07/17 0404  Gross per 24 hour  Intake              240 ml  Output              300 ml  Net              -60 ml      PHYSICAL EXAM  General: Elderly lady, sitting upright in chair, in no acute distress HEENT:  Normocephalic and atramatic Neck:  No JVD.  Lungs: Normal effort of breathing. Faint right lower lobe crackles, no wheezing Heart: HRRR . 2/6 systolic murmur Abdomen: Bowel sounds are positive, abdomen soft and non-tender  Msk:  Back normal, sitting upright in chair. Gait not assessed. Extremities: No clubbing, cyanosis or edema.   Neuro: Alert and oriented X 3. Psych:  Good affect, responds appropriately   LABS: Basic Metabolic Panel:  Recent Labs  05/06/17 0948 05/07/17 0332  NA 141 141  K 2.5* 3.2*  CL 105 109  CO2 26 27  GLUCOSE 130* 102*  BUN 5* 8  CREATININE 0.51 0.72  CALCIUM 8.0* 7.8*  MG 1.4* 2.1   Liver Function Tests:  Recent Labs  05/05/17 1412  AST 23  ALT 10*  ALKPHOS 73  BILITOT 1.0  PROT 5.4*  ALBUMIN 2.7*    Recent Labs  05/05/17 1412  LIPASE 15   CBC:  Recent Labs  05/05/17 1412 05/06/17 0948  WBC 4.9 3.6  NEUTROABS 2.9  --   HGB 12.3 12.5  HCT 35.6 36.1  MCV 87.7 87.3  PLT 236 220   Cardiac Enzymes:  Recent Labs  05/05/17 2114 05/06/17 0216 05/06/17 0948   TROPONINI <0.03 <0.03 <0.03   BNP: Invalid input(s): POCBNP D-Dimer: No results for input(s): DDIMER in the last 72 hours. Hemoglobin A1C: No results for input(s): HGBA1C in the last 72 hours. Fasting Lipid Panel: No results for input(s): CHOL, HDL, LDLCALC, TRIG, CHOLHDL, LDLDIRECT in the last 72 hours. Thyroid Function Tests: No results for input(s): TSH, T4TOTAL, T3FREE, THYROIDAB in the last 72 hours.  Invalid input(s): FREET3 Anemia Panel: No results for input(s): VITAMINB12, FOLATE, FERRITIN, TIBC, IRON, RETICCTPCT in the last 72 hours.  US Carotid Bilateral  Result Date: 05/06/2017 CLINICAL DATA:  Syncope. EXAM: BILATERAL CAROTID DUPLEX ULTRASOUND TECHNIQUE: Pearline Cables scale imaging, color Doppler and duplex ultrasound were performed of bilateral carotid and vertebral arteries in the neck. COMPARISON:  CT 03/02/2017 . FINDINGS: Criteria: Quantification of carotid stenosis is based on velocity parameters that correlate the residual internal carotid diameter with NASCET-based stenosis levels, using the diameter of the distal internal carotid lumen as the denominator for stenosis measurement. The following velocity measurements were obtained: RIGHT ICA:  135/33 cm/sec  CCA:  403/47 cm/sec SYSTOLIC ICA/CCA RATIO:  1.2 DIASTOLIC ICA/CCA RATIO:  2.6 ECA:  145 cm/sec LEFT ICA:  89/16 cm/sec CCA:  42/59 cm/sec SYSTOLIC ICA/CCA RATIO:  0.9 DIASTOLIC ICA/CCA RATIO:  1.6 ECA:  102 cm/sec RIGHT CAROTID ARTERY: Mild right carotid bifurcation atherosclerotic vascular plaque. No flow limiting stenosis. RIGHT VERTEBRAL ARTERY:  Patent with antegrade flow. LEFT CAROTID ARTERY: Mild left carotid bifurcation atherosclerotic vascular plaque. No flow limiting stenosis. LEFT VERTEBRAL ARTERY:  Patent with antegrade flow. IMPRESSION: 1. Mild bilateral carotid atherosclerotic vascular plaque. No flow limiting stenosis. Degree of stenosis less than 50% bilaterally. 2. Vertebrals are patent antegrade flow. Electronically  Signed   By: Marcello Moores  Register   On: 05/06/2017 10:39   Dg Chest Portable 1 View  Result Date: 05/05/2017 CLINICAL DATA:  Pt in via EMS from home with c/o syncope. EMS reports pt was here for MI 3 weeks ago and just got home from rehab on Monday. EMS reports per pt she woke up twice and felt like she had passed out both times. EMS reports pt was diaphoretic upon arrival. Pt denies pain at this time but reports feels nauseated and has had some diarrhea. EMS reports FBSB 95, BP 124/80. Pt denies CP, SOB at this time. States she had diarrhea and feels nauseated EXAM: PORTABLE CHEST 1 VIEW COMPARISON:  05/04/2017 FINDINGS: Cardiac silhouette is mildly enlarged. No mediastinal or hilar masses. There is opacity at both lung bases obscuring hemidiaphragms consistent with a combination of pleural effusions with either atelectasis, pneumonia or a combination. There is no evidence of pulmonary edema. Remainder of the lungs is clear. No pneumothorax. Skeletal structures are demineralized but grossly intact. IMPRESSION: 1. Bilateral lung base opacity consistent with a combination of pleural effusions and atelectasis. Pneumonia is possible. Lung base opacity is mildly increased from the prior exam, most evident on the right. 2. No pulmonary edema. Electronically Signed   By: Lajean Manes M.D.   On: 05/05/2017 15:07     Echo EF 20-25%  TELEMETRY: Sinus rhythm, rate 80 bpm  ASSESSMENT AND PLAN:  Active Problems:   Syncope    1. Syncope, unwitnessed with prodromal symptoms, not orthostatic. No evidence of cardiac arrhthymias or significant pauses on telemetry. 2. 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  3. Essential hypertension, blood pressure well controlled  4. Type II diabetes  Recommendations: 1. Agree with overall current therapy. 2. Continue carvedilol, lisinopril,  and Lasix. 3. Continue to monitor on telemetry 4. Carotid ultrasound 5. Hesitant to proceed with nuclear stress test or cardiac catheterization for further evaluation of relatively new cardiomyopathy as patient may not be a good candidate for PCI with subsequent anticoagulation as she is a falls risk, or bypass if required.  6. If patient continues to remain stable today, would consider discharge tomorrow with order for 30-day Loop monitor, with or without Life Vest. Will need to discuss further with family to determine if patient is a good candidate for ICD.  7. Recommend repeating 2D echocardiogram in 3 months.   Clabe Seal, PA-C 05/07/2017 9:07 AM

## 2017-05-07 NOTE — Evaluation (Addendum)
Occupational Therapy Evaluation Patient Details Name: Meghan [name] MRN: [id number] DOB: 03-09-1946 Today's Date: 05/07/2017    History of Present Illness Pt. is a 71 y.o. female who was admitted to [facility] with Hx of CHF, COPD, Collagen Vascular disease, and DM. Pt. had multiple admissions for CHF/PNA over the past month. Pt.'s was most recent hospitalization was due to sespsis, tachycardia, hypoxia, and an elevated white blood count. Pt. was back home from rehab for only a short time when she was hospitalized for this current admission.   Clinical Impression   Pt. Is a 71 y.o. was as admitted to [facility] with Hx of CHF, COPD, Collagen Vascular  Disease, and DM. Pt. has been home from rehab for a short time prior to her readmission to [facility]. Pt. Reports she was managing her own medicine, and was able to prepare light meals for herself. Pt. presents with weakness, limited activity tolerance, and limited functional mobility which hinder her ability to complete ADL, and IADL functioning. Pt. could benefit from skilled OT services for ADL training, A/E training, UE there. Ex, there. Act., and pt. education about home modification, DME, energy conservation, and work simplification strategies. Pt. education was provided about energy conservation, and work simplification techniques. Pt. Was provided with a handout about energy conservation/work simplification. Pt. plans to return home upon discharge. Pt. could benefit from follow-up OT services upon discharge.    Follow Up Recommendations  Home health OT    Equipment Recommendations       Recommendations for Other Services       Precautions / Restrictions   Fall                                                   ADL either performed or assessed with clinical judgement   ADL Overall ADL's : Needs assistance/impaired Eating/Feeding: Set up;Bed level   Grooming: Set up;Bed level   Upper Body Bathing: Set up   Lower Body Bathing:  Minimal assistance   Upper Body Dressing : Bed level;Set up   Lower Body Dressing: Minimal assistance               Functional mobility during ADLs: Minimal assistance       Vision Patient Visual Report: No change from baseline       Perception     Praxis      Pertinent Vitals/Pain       Hand Dominance Right   Extremity/Trunk Assessment             Communication Communication Communication: No difficulties   Cognition Arousal/Alertness: Awake/alert Behavior During Therapy: WFL for tasks assessed/performed Overall Cognitive Status: No family/caregiver present to determine baseline cognitive functioning                                     General Comments       Exercises     Shoulder Instructions      Home Living Family/patient expects to be discharged to:: Private residence Living Arrangements: Alone Available Help at Discharge: Neighbor;Available 24 hours/day Type of Home: House Home Access: Stairs to enter [employer] of Steps: 3 Entrance Stairs-Rails: Can reach both Home Layout: One level     Bathroom Shower/Tub: Teacher, early years/pre: Standard  Bathroom Accessibility: Yes   Home Equipment: Walker - 2 wheels;Shower seat;Grab bars - tub/shower          Prior Functioning/Environment Level of Independence: Independent with assistive device(s)        Comments: Pt. reports independence with ADLs, light meal preparation, and medication management.        OT Problem List: Decreased strength;Impaired UE functional use;Decreased knowledge of use of DME or AE;Decreased activity tolerance      OT Treatment/Interventions: Self-care/ADL training;Therapeutic exercise;Patient/family education;Therapeutic activities;Energy conservation    OT Goals(Current goals can be found in the care plan section) Acute Rehab OT Goals Patient Stated Goal: To return home OT Goal Formulation: With patient Potential to  Achieve Goals: Good  OT Frequency: Min 2X/week   Barriers to D/C:            Co-evaluation              AM-PAC PT "6 Clicks" Daily Activity     Outcome Measure Help from another person eating meals?: None Help from another person taking care of personal grooming?: None Help from another person toileting, which includes using toliet, bedpan, or urinal?: A Little Help from another person bathing (including washing, rinsing, drying)?: A Little Help from another person to put on and taking off regular upper body clothing?: A Little Help from another person to put on and taking off regular lower body clothing?: A Little 6 Click Score: 20   End of Session Equipment Utilized During Treatment: Gait belt  Activity Tolerance: Patient tolerated treatment well Patient left: in bed;with call bell/phone within reach;with family/visitor present  OT Visit Diagnosis: Muscle weakness (generalized) (M62.81)                Time: 1250-1315 OT Time Calculation (min): 25 min Charges:  OT General Charges $OT Visit: 1 Visit OT Evaluation $OT Eval Moderate Complexity: 1 Mod G-Codes: OT G-codes **NOT FOR INPATIENT CLASS** Functional Limitation: Self care Self Care Current Status (B2620): At least 20 percent but less than 40 percent impaired, limited or restricted Self Care Goal Status (B5597): At least 1 percent but less than 20 percent impaired, limited or restricted   Harrel Carina, MS, OTR/L   Harrel Carina, MS, OTR/L 05/07/2017, 2:06 PM

## 2017-05-07 NOTE — Progress Notes (Signed)
Pt to be discharged this afternoon. Iv and tele removed. disch instructions and orderfor o.p monitor given to pt along with prescrips. dischared via w.c.. Transport by family

## 2017-05-07 NOTE — Care Management Obs Status (Signed)
[place] NOTIFICATION   Patient Details  Name: Meghan [name] MRN: [id number] Date of Birth: [birth year]   Medicare Observation Status Notification Given:  Yes on contact isolation so unable to place a signed copy in chart. Notice given and explained, one given to patient and the other to HIM for scanning   Katrina Stack, RN 05/07/2017, 9:26 AM

## 2017-05-07 NOTE — Consult Note (Signed)
   [facility] CM Inpatient Consult   05/07/2017  Meghan [name] [birth year] [id number]  Referral received from inpatient case manager for [place] [facility] services and post hospital discharge follow up related to a diagnosis of COPD, DM and multiple admissions in the last 6 months. Patient was evaluated for community based chronic disease management services with [facility] care Management Program as a benefit of patient's [employer]. Met with the patient at the bedside to explain [place] [facility] services. Patient stating she has transportation concerns related to she does not drive and her granddaughter has 3 kids. She would like for [place] worker to assist her with different options for transportation in case granddaughter not available.  Consent form signed. Patient gave [phone] as the best number to reach her. She also gave written permission to call her granddaughter [name] at [phone] and her neighbor [name] at [phone] if she can not be reached.Patient will receive post hospital discharge calls and be evaluated for monthly home visits. [facility] Care Management services does not interfere with or replace any services arranged by the inpatient care management team. RNCM left contact information and [facility] literature at the bedside. Made inpatient RNCM aware that [facility] will be following for care management. For additional questions please contact:   Nikaela Coyne RN, [facility] Liaison  [phone]) Business Mobile [phone]) Toll free office

## 2017-05-10 ENCOUNTER — Emergency Department: Payer: Medicare Other

## 2017-05-10 ENCOUNTER — Emergency Department
Admission: EM | Admit: 2017-05-10 | Discharge: 2017-05-10 | Disposition: A | Discharge status: Home / Self Care | Payer: Medicare Other | Attending: Emergency Medicine | Admitting: Emergency Medicine

## 2017-05-10 DIAGNOSIS — K429 Umbilical hernia without obstruction or gangrene: Secondary | ICD-10-CM | POA: Diagnosis not present

## 2017-05-10 DIAGNOSIS — Z9049 Acquired absence of other specified parts of digestive tract: Secondary | ICD-10-CM | POA: Insufficient documentation

## 2017-05-10 DIAGNOSIS — F419 Anxiety disorder, unspecified: Secondary | ICD-10-CM | POA: Diagnosis not present

## 2017-05-10 DIAGNOSIS — R52 Pain, unspecified: Secondary | ICD-10-CM

## 2017-05-10 DIAGNOSIS — J449 Chronic obstructive pulmonary disease, unspecified: Secondary | ICD-10-CM | POA: Diagnosis not present

## 2017-05-10 DIAGNOSIS — J45909 Unspecified asthma, uncomplicated: Secondary | ICD-10-CM | POA: Insufficient documentation

## 2017-05-10 DIAGNOSIS — E119 Type 2 diabetes mellitus without complications: Secondary | ICD-10-CM | POA: Insufficient documentation

## 2017-05-10 DIAGNOSIS — F329 Major depressive disorder, single episode, unspecified: Secondary | ICD-10-CM | POA: Diagnosis not present

## 2017-05-10 DIAGNOSIS — R112 Nausea with vomiting, unspecified: Secondary | ICD-10-CM

## 2017-05-10 DIAGNOSIS — I110 Hypertensive heart disease with heart failure: Secondary | ICD-10-CM | POA: Diagnosis not present

## 2017-05-10 DIAGNOSIS — R197 Diarrhea, unspecified: Secondary | ICD-10-CM | POA: Insufficient documentation

## 2017-05-10 DIAGNOSIS — J9621 Acute and chronic respiratory failure with hypoxia: Secondary | ICD-10-CM | POA: Diagnosis not present

## 2017-05-10 DIAGNOSIS — R1111 Vomiting without nausea: Secondary | ICD-10-CM | POA: Diagnosis not present

## 2017-05-10 DIAGNOSIS — E876 Hypokalemia: Secondary | ICD-10-CM

## 2017-05-10 DIAGNOSIS — R1110 Vomiting, unspecified: Secondary | ICD-10-CM | POA: Diagnosis not present

## 2017-05-10 DIAGNOSIS — I5023 Acute on chronic systolic (congestive) heart failure: Secondary | ICD-10-CM | POA: Diagnosis not present

## 2017-05-10 DIAGNOSIS — I2510 Atherosclerotic heart disease of native coronary artery without angina pectoris: Secondary | ICD-10-CM | POA: Diagnosis not present

## 2017-05-10 DIAGNOSIS — R079 Chest pain, unspecified: Secondary | ICD-10-CM | POA: Diagnosis not present

## 2017-05-10 LAB — BASIC METABOLIC PANEL
Anion gap: 14 (ref 5–15)
BUN: 9 mg/dL (ref 6–20)
CHLORIDE: 107 mmol/L (ref 101–111)
CO2: 21 mmol/L — AB (ref 22–32)
CREATININE: 0.62 mg/dL (ref 0.44–1.00)
Calcium: 8.9 mg/dL (ref 8.9–10.3)
GFR calc Af Amer: >60 mL/min (ref >60)
GFR calc non Af Amer: >60 mL/min (ref >60)
GLUCOSE: 109 mg/dL — AB (ref 65–99)
POTASSIUM: 3.2 mmol/L — AB (ref 3.5–5.1)
SODIUM: 142 mmol/L (ref 135–145)

## 2017-05-10 LAB — GASTROINTESTINAL PANEL BY PCR, STOOL (REPLACES STOOL CULTURE)
ASTROVIRUS: NOT DETECTED
Adenovirus F40/41: NOT DETECTED
CAMPYLOBACTER SPECIES: NOT DETECTED
CYCLOSPORA CAYETANENSIS: NOT DETECTED
Cryptosporidium: NOT DETECTED
ENTAMOEBA HISTOLYTICA: NOT DETECTED
ENTEROTOXIGENIC E COLI (ETEC): NOT DETECTED
Enteroaggregative E coli (EAEC): NOT DETECTED
Enteropathogenic E coli (EPEC): NOT DETECTED
Giardia lamblia: NOT DETECTED
NOROVIRUS GI/GII: NOT DETECTED
PLESIMONAS SHIGELLOIDES: NOT DETECTED
Rotavirus A: NOT DETECTED
SALMONELLA SPECIES: NOT DETECTED
SAPOVIRUS (I, II, IV, AND V): NOT DETECTED
SHIGA LIKE TOXIN PRODUCING E COLI (STEC): NOT DETECTED
SHIGELLA/ENTEROINVASIVE E COLI (EIEC): NOT DETECTED
Vibrio cholerae: NOT DETECTED
Vibrio species: NOT DETECTED
Yersinia enterocolitica: NOT DETECTED

## 2017-05-10 LAB — HEPATIC FUNCTION PANEL
ALT: 9 U/L — AB (ref 14–54)
AST: 24 U/L (ref 15–41)
Albumin: 3 g/dL — ABNORMAL LOW (ref 3.5–5.0)
Alkaline Phosphatase: 72 U/L (ref 38–126)
BILIRUBIN DIRECT: 0.2 mg/dL (ref 0.1–0.5)
BILIRUBIN INDIRECT: 1 mg/dL — AB (ref 0.3–0.9)
TOTAL PROTEIN: 5.9 g/dL — AB (ref 6.5–8.1)
Total Bilirubin: 1.2 mg/dL (ref 0.3–1.2)

## 2017-05-10 LAB — URINALYSIS, COMPLETE (UACMP) WITH MICROSCOPIC
BACTERIA UA: NONE SEEN
Bilirubin Urine: NEGATIVE
GLUCOSE, UA: NEGATIVE mg/dL
Hgb urine dipstick: NEGATIVE
KETONES UR: 80 mg/dL — AB
Leukocytes, UA: NEGATIVE
Nitrite: NEGATIVE
PROTEIN: 30 mg/dL — AB
SQUAMOUS EPITHELIAL / LPF: NONE SEEN
Specific Gravity, Urine: 1.019 (ref 1.005–1.030)
pH: 5 (ref 5.0–8.0)

## 2017-05-10 LAB — CBC
HEMATOCRIT: 39.6 % (ref 35.0–47.0)
Hemoglobin: 13.5 g/dL (ref 12.0–16.0)
MCH: 29.8 pg (ref 26.0–34.0)
MCHC: 34 g/dL (ref 32.0–36.0)
MCV: 87.8 fL (ref 80.0–100.0)
PLATELETS: 269 10*3/uL (ref 150–440)
RBC: 4.51 MIL/uL (ref 3.80–5.20)
RDW: 13.5 % (ref 11.5–14.5)
WBC: 6.2 10*3/uL (ref 3.6–11.0)

## 2017-05-10 LAB — C DIFFICILE QUICK SCREEN W PCR REFLEX
C DIFFICILE (CDIFF) INTERP: NOT DETECTED
C DIFFICLE (CDIFF) ANTIGEN: NEGATIVE
C Diff toxin: NEGATIVE

## 2017-05-10 LAB — BRAIN NATRIURETIC PEPTIDE: B NATRIURETIC PEPTIDE 5: 1170 pg/mL — AB (ref 0.0–100.0)

## 2017-05-10 LAB — TROPONIN I: Troponin I: <0.03 ng/mL (ref <0.03)

## 2017-05-10 LAB — LIPASE, BLOOD: Lipase: 16 U/L (ref 11–51)

## 2017-05-10 MED ORDER — LOPERAMIDE HCL 2 MG PO TABS: 2.0000 mg | ORAL_TABLET | Freq: Four times a day (QID) | ORAL | 0 refills | Status: DC | PRN

## 2017-05-10 MED ORDER — PROMETHAZINE HCL 12.5 MG PO TABS: 12.5000 mg | ORAL_TABLET | Freq: Three times a day (TID) | ORAL | 0 refills | Status: DC | PRN

## 2017-05-10 MED ORDER — PROMETHAZINE HCL 25 MG RE SUPP: 25.0000 mg | Freq: Four times a day (QID) | RECTAL | 0 refills | Status: DC | PRN

## 2017-05-10 MED ORDER — IOPAMIDOL (ISOVUE-300) INJECTION 61%
100.0000 mL | Freq: Once | INTRAVENOUS | Status: AC | PRN
  Administered 2017-05-10: 100 mL via INTRAVENOUS

## 2017-05-10 MED ORDER — ONDANSETRON HCL 4 MG/2ML IJ SOLN
4.0000 mg | Freq: Once | INTRAMUSCULAR | Status: AC
  Administered 2017-05-10: 4 mg via INTRAVENOUS
  Filled 2017-05-10: qty 2

## 2017-05-10 MED ORDER — SODIUM CHLORIDE 0.9 % IV BOLUS (SEPSIS)
500.0000 mL | Freq: Once | INTRAVENOUS | Status: AC
  Administered 2017-05-10: 500 mL via INTRAVENOUS

## 2017-05-10 MED ORDER — IOPAMIDOL (ISOVUE-300) INJECTION 61%
30.0000 mL | Freq: Once | INTRAVENOUS | Status: AC
  Administered 2017-05-10: 30 mL via ORAL

## 2017-05-10 MED ORDER — PROMETHAZINE HCL 25 MG/ML IJ SOLN
25.0000 mg | Freq: Once | INTRAMUSCULAR | Status: AC
  Administered 2017-05-10: 25 mg via INTRAVENOUS
  Filled 2017-05-10: qty 1

## 2017-05-10 MED ORDER — ONDANSETRON 4 MG PO TBDP: 4.0000 mg | ORAL_TABLET | Freq: Three times a day (TID) | ORAL | 0 refills | Status: DC | PRN

## 2017-05-10 MED ORDER — ONDANSETRON 4 MG PO TBDP
4.0000 mg | ORAL_TABLET | Freq: Once | ORAL | Status: AC
  Administered 2017-05-10: 4 mg via ORAL
  Filled 2017-05-10: qty 1

## 2017-05-10 MED ORDER — POTASSIUM CHLORIDE CRYS ER 20 MEQ PO TBCR
40.0000 meq | EXTENDED_RELEASE_TABLET | Freq: Once | ORAL | Status: AC
  Administered 2017-05-10: 40 meq via ORAL
  Filled 2017-05-10: qty 2

## 2017-05-10 NOTE — ED Notes (Signed)
Pt asked to use the bathroom, pt assisted to ambulate to the commode and back to the stretcher; pt also asked for nausea medicine; MD made aware and order received for Zofran ODT

## 2017-05-10 NOTE — ED Notes (Signed)
Pt had watery yellow BM that was accompanied with urine in bedpan. Sample unable to be collected due to contamination with urine. Pericare performed, brief changed.

## 2017-05-10 NOTE — ED Notes (Signed)
Report to Kenny, RN

## 2017-05-10 NOTE — ED Notes (Signed)
PIV attempt by EMS unsuccessful, attempt X 2 by Tommy, EMTP unsuccessful, attempt by this RN X 1 unsuccessful.

## 2017-05-10 NOTE — ED Notes (Signed)
Pt had urge for BM, placed on bedpan. Unable to have BM at this time.

## 2017-05-10 NOTE — Discharge Instructions (Signed)
Please make an appointment with your primary care physician to have your potassium rechecked and to be reevaluated for your symptoms.  Please take a clear liquid diet for the next 24 hours, then advance to a bland diet as tolerated.  Return to the emergency department if you develop severe pain, fever, inability to keep down fluids, lightheadedness or fainting, or any other symptoms concerning to you.

## 2017-05-10 NOTE — ED Notes (Signed)
E-signature not obtained; E-sign pad in the room is not working; hard copy printed and signed by the patient.

## 2017-05-10 NOTE — ED Notes (Signed)
Patient transported to CT 

## 2017-05-10 NOTE — ED Provider Notes (Signed)
[facility] Emergency Department Provider Note  ____________________________________________  Time seen: Approximately 8:35 AM  I have reviewed the triage vital signs and the nursing notes.   HISTORY  Chief Complaint Emesis and Chest Pain    HPI Meghan [name] is a 71 y.o. female with a history of CHF and recently diagnosed with a depressed EF, recent hospitalization for syncope and concern for intermittent arrhythmia, HTN, s/p ammonia history of hysterectomy and cholecystectomy, presenting with nausea vomiting and diarrhea. The patient reports that this morning she woke up and had 4 episodes of loose stool that was nonbloody, associated with nausea vomiting and heaving. She reports central chest tightness that only occurs when she is heaving and resolves when she is no longer vomiting or heaving. No associated palpitations, shortness of breath, diaphoresis. She is not had any abdominal pain, dysuria, fever or chills. She is not had any known sick contacts.She ate a ham sandwich and a salad for dinner last night. She was given 4 mg of Zofran IM by EMS without any improvement in her nausea.   Past Medical History:  Diagnosis Date  . Anginal pain ([place])   . Anxiety   . Arthritis    RA  . Asthma   . Brain tumor (benign) ([place])   . CHF (congestive heart failure) ([place])   . Collagen vascular disease ([place])   . COPD (chronic obstructive pulmonary disease) ([place])   . Coronary artery disease   . DDD (degenerative disc disease)   . Depression   . Diabetes mellitus   . GERD (gastroesophageal reflux disease)   . Headache   . Heart murmur   . Heart murmur   . Hypercholesteremia   . Hypertension   . Lumbar degenerative disc disease   . Migraines   . Obese   . Obesity   . Osteopenia   . Osteoporosis   . Pneumonia   . PTSD (post-traumatic stress disorder)     Patient Active Problem List   Diagnosis Date Noted  . Syncope 05/05/2017  . Acute on chronic systolic CHF  (congestive heart failure) ([place]) 04/16/2017  . Acute lower UTI 04/16/2017  . Acute respiratory failure ([place]) 04/16/2017  . Pressure injury of skin 03/22/2017  . HCAP (healthcare-associated pneumonia) 03/20/2017  . Acute on chronic respiratory failure with hypoxia ([place]) 03/20/2017  . Acute respiratory failure with hypoxia ([place]) 03/20/2017  . Near syncope 02/27/2017  . Dehydration 02/27/2017  . Colitis 02/22/2017  . Seizure ([place]) 01/03/2016  . Chronic tension-type headache, intractable 12/04/2015  . 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 Degeneration of intervertebral disc of lumbar region 07/15/2015  . Seropositive rheumatoid arthritis ([place]) 07/15/2015  . Asthma with acute exacerbation 03/10/2015  . Hypokalemia 03/01/2015  . Hyponatremia 03/01/2015  . DDD (degenerative disc disease), lumbar 01/31/2015  . Arthritis, degenerative 01/31/2015  . Rheumatoid arthritis with rheumatoid factor ([place]) 01/31/2015  . HTN (hypertension) 12/24/2014  . Sepsis ([place]) 12/24/2014  . Left knee pain 12/24/2014  . GERD (gastroesophageal reflux disease) 12/24/2014  . COPD (chronic obstructive pulmonary disease) ([place]) 12/24/2014  . Depression 12/24/2014  . Anxiety 12/24/2014  . Severe bipolar disorder with psychotic features, mood-congruent ([place]) 11/16/2014  . Neurosis, posttraumatic 11/16/2014  . H/O gastric ulcer 11/16/2014  . Barton's fracture of distal radius, closed 09/19/2014  . Neuritis or radiculitis due to rupture of lumbar intervertebral disc 05/11/2014  . Cervico-occipital neuralgia 03/26/2014  . Difficulty in walking 03/26/2014  . Difficulty in walking, not elsewhere classified 03/26/2014  . Cervical  spine syndrome 01/26/2014  . Cephalalgia 01/09/2014  . Disordered sleep 01/09/2014  . BP (high blood pressure) 10/19/2013  . Adiposity 10/19/2013  . Cardiac murmur 10/19/2013  . PNA (pneumonia) 10/19/2013  . Breath shortness 10/19/2013  . Chronic obstructive pulmonary disease ([place])  10/19/2013  . Diabetes mellitus ([place]) 10/19/2013    Past Surgical History:  Procedure Laterality Date  . ABDOMINAL HYSTERECTOMY    . CERVICAL FUSION    . CHOLECYSTECTOMY    . COLONOSCOPY WITH PROPOFOL N/A 09/20/2015   Procedure: COLONOSCOPY WITH PROPOFOL;  Surgeon: Josefine Class, MD;  Location: [facility] ENDOSCOPY;  Service: Endoscopy;  Laterality: N/A;  . COLONOSCOPY WITH PROPOFOL N/A 01/27/2016   Procedure: COLONOSCOPY WITH PROPOFOL;  Surgeon: Manya Silvas, MD;  Location[facility] END[facility]COPY;  Service: Endoscopy;  Laterality: N/A;  . FINGER ARTHROPLASTY  02/23/2012   Procedure: FINGER ARTHROPLASTY;  Surgeon: Cammie Sickle., MD;  Location: [place];  Service: Orthopedics;  Laterality: Left;  Extensor carpi radialis longus to Extensor carpi ulnaris transfer, left Metaphalangeal reconstructions of index and long fingers,  . FOOT ARTHROPLASTY     toes x2 rt foot  . HAND RECONSTRUCTION  2011   right-multiple finger joint reconst  . JOINT REPLACEMENT     bilat knee replacements    Current Outpatient Rx  . Order #: [id number] Class: Normal  . Order #: [id number] Class: Historical Med  . Order #: [id number] Class: Print  . Order #: [id number] Class: Normal  . Order #: [id number] Class: Normal  . Order #: [id number] Class: Normal  . Order #: [id number] Class: Historical Med  . Order #: [id number] Class: Historical Med  . Order #: [id number] Class: Historical Med  . Order #: [id number] Class: No Print  . Order #: [id number] Class: Historical Med  . Order #: [id number] Class: Historical Med  . Order #: [id number] Class: Historical Med  . Order #: [id number] Class: Historical Med  . Order #: [id number] Class: Historical Med  . Order #: [id number] Class: No Print  . Order #: [id number] Class: Print  . Order #: [id number] Class: Print  . Order #: [id number] Class: Print  . Order #: [id number] Class: Print  . Order #: [id number] Class: Historical Med  . Order #: [id number] Class: Print  . Order #:  [id number] Class: Print    Allergies Gabapentin; Naproxen; Nsaids; and Tramadol  Family History  Problem Relation Age of Onset  . Depression Sister   . Hypertension Mother   . Arthritis/Rheumatoid Mother   . Heart attack Father   . Hypertension Father   . CAD Unknown   . Hypertension Unknown   . Diabetes Mellitus II Unknown   . Arthritis Unknown     Social History Social History  Substance Use Topics  . Smoking status: Never Smoker  . Smokeless tobacco: Never Used  . Alcohol use No    Review of Systems Constitutional: No fever/chills.no lightheadedness or syncope. Eyes: No visual changes. ENT: No sore throat. No congestion or rhinorrhea. Cardiovascular: + chest pain. Denies palpitations. Respiratory: Denies shortness of breath.  No cough. Gastrointestinal: No abdominal pain.  + nausea, + vomiting.  + diarrhea.  No constipation. Genitourinary: Negative for dysuria. Musculoskeletal: Negative for back pain. Skin: Negative for rash. Neurological: Negative for headaches. No focal numbness, tingling or weakness.     ____________________________________________   PHYSICAL EXAM:  VITAL SIGNS: ED Triage Vitals  Enc Vitals Group     BP 05/10/17 0828 120/72     Pulse Rate 05/10/17 0828 92     Resp 05/10/17 0828 (!) 23     Temp 05/10/17 0828  98.5 F (36.9 C)     Temp Source 05/10/17 0828 Oral     SpO2 05/10/17 0828 100 %     Weight 05/10/17 0821 125 lb (56.7 kg)     Height --      Head Circumference --      Peak Flow --      Pain Score 05/10/17 0821 5     Pain Loc --      Pain Edu? --      Excl. in [place]? --     Constitutional: Alert and oriented. Chronically illappearing butin no acute distress. Answers questions appropriately. Eyes: Conjunctivae are normal.  EOMI. No scleral icterus. Head: Atraumatic. Nose: No congestion/rhinnorhea. Mouth/Throat: Mucous membranes are moist.  Neck: No stridor.  Supple.  No JVD. No meningismus. Cardiovascular: Normal rate,  regular rhythm. No murmurs, rubs or gallops.  Respiratory: Normal respiratory effort.  No accessory muscle use or retractions. Lungs CTAB.  No wheezes, rales or ronchi. Gastrointestinal: overweight.Soft, nontender and nondistended.  No guarding or rebound.  No peritoneal signs. Musculoskeletal: No LE edema.  Neurologic:  A&Ox3.  Speech is clear.  Face and smile are symmetric.  EOMI.  Moves all extremities well. Skin:  Skin is warm, dry and intact. No rash noted. Psychiatric: Mood and affect are normal. Speech and behavior are normal.  Normal judgement.  ____________________________________________   LABS (all labs ordered are listed, but only abnormal results are displayed)  Labs Reviewed  BASIC METABOLIC PANEL - Abnormal; Notable for the following:       Result Value   Potassium 3.2 (*)    CO2 21 (*)    Glucose, Bld 109 (*)    All other components within normal limits  URINALYSIS, COMPLETE (UACMP) WITH MICROSCOPIC - Abnormal; Notable for the following:    Color, Urine AMBER (*)    APPearance CLEAR (*)    Ketones, ur 80 (*)    Protein, ur 30 (*)    All other components within normal limits  HEPATIC FUNCTION PANEL - Abnormal; Notable for the following:    Total Protein 5.9 (*)    Albumin 3.0 (*)    ALT 9 (*)    Indirect Bilirubin 1.0 (*)    All other components within normal limits  BRAIN NATRIURETIC PEPTIDE - Abnormal; Notable for the following:    B Natriuretic Peptide 1,170.0 (*)    All other components within normal limits  GASTROINTESTINAL PANEL BY PCR, STOOL (REPLACES STOOL CULTURE)  C DIFFICILE QUICK SCREEN W PCR REFLEX  CBC  TROPONIN I  LIPASE, BLOOD   ____________________________________________  EKG  ED ECG REPORT I, Eula Listen, the attending physician, personally viewed and interpreted this ECG.   Date: 05/10/2017  EKG Time: 826  Rate: 96  Rhythm: normal sinus rhythm  Axis: leftward  Intervals:prolonged QTc  ST&T Change: No STEMI; no  [place] criteria  This EKG is compared to 05/05/17 which also shows a left bundle branch block and a similar morphology. ____________________________________________  [id number]  Dg Chest 2 View  Result Date: 05/10/2017 CLINICAL DATA:  Chest tightness and vomiting EXAM: CHEST  2 VIEW COMPARISON:  May 05, 2017 and April 17, 2017 FINDINGS: There are small bilateral pleural effusions, larger on the left than on the right, stable. There is bibasilar atelectasis. There is mild interstitial edema. There is no airspace consolidation. There is cardiomegaly with pulmonary venous hypertension. No evident adenopathy. There is aortic atherosclerosis. There is degenerative change in the thoracic spine. Bones are somewhat osteoporotic.  There is calcification in the left carotid artery. IMPRESSION: There is evidence of a degree of congestive heart failure. Note that there is mild interstitial edema but no airspace consolidation appreciable. There is aortic atherosclerosis. Bones are osteoporotic. There is a focal area of calcification in the left carotid artery. Aortic Atherosclerosis (ICD10-I70.0). Electronically Signed   By: Lowella Grip III M.D.   On: 05/10/2017 09:40   Dg Abd 1 View  Result Date: 05/10/2017 CLINICAL DATA:  Vomiting EXAM: ABDOMEN - 1 VIEW COMPARISON:  None. FINDINGS: There is a loop of small bowel in the mid abdomen which is slightly distended. No other bowel dilatation is evident. No air-fluid levels. No free air. There are surgical clips in the gallbladder as well as in the lower abdomen. There appears to be air in the common bile duct. There is calcification in the common and external iliac arteries as well as phleboliths in the pelvis. IMPRESSION: 1. Loop of localized dilated small bowel. Suspect a degree of enteritis or ileus. Bowel obstruction not felt to be likely. 2. Gallbladder absent. Air in the common bile duct raises question of previous sphincterotomy. If patient has not  had previous sphincterotomy, this finding potentially could be indicative of perforated upper abdominal ulcer. Appropriate clinical history essential in this regard. Electronically Signed   By: Lowella Grip III M.D.   On: 05/10/2017 09:43   Ct Abdomen Pelvis W Contrast  Result Date: 05/10/2017 CLINICAL DATA:  Nausea, vomiting. EXAM: CT ABDOMEN AND PELVIS WITH CONTRAST TECHNIQUE: Multidetector CT imaging of the abdomen and pelvis was performed using the standard protocol following bolus administration of intravenous contrast. CONTRAST:  174mL ISOVUE-300 IOPAMIDOL (ISOVUE-300) INJECTION 61% COMPARISON:  CT scan of February 22, 2017. FINDINGS: Lower chest: Mild bilateral pleural effusion is noted with adjacent subsegmental atelectasis. Hepatobiliary: Stable enhancing abnormality seen in dome of right hepatic lobe most consistent with benign etiology. Status post cholecystectomy. No biliary dilatation. Pancreas: Unremarkable. No pancreatic ductal dilatation or surrounding inflammatory changes. Spleen: Normal in size without focal abnormality. Adrenals/Urinary Tract: Adrenal glands are unremarkable. Kidneys are normal, without renal calculi, focal lesion, or hydronephrosis. Bladder is unremarkable. Stomach/Bowel: The stomach is unremarkable. The appendix is not visualized. Colonic wall thickening noted on prior exam has resolved. There is no definite evidence of bowel obstruction or inflammation. Vascular/Lymphatic: Aortic atherosclerosis. No enlarged abdominal or pelvic lymph nodes. Reproductive: Status post hysterectomy. No adnexal masses. Other: Mild fat containing periumbilical hernia is noted. No abnormal fluid collection is noted. Musculoskeletal: No acute or significant osseous findings. IMPRESSION: Mild bilateral pleural effusions with adjacent subsegmental atelectasis. Aortic atherosclerosis. Mild fat containing periumbilical hernia. No other significant abnormality seen in the abdomen or pelvis.  Electronically Signed   By: Marijo Conception, M.D.   On: 05/10/2017 13:22    ____________________________________________   PROCEDURES  Procedure(s) performed: None  Procedures  Critical Care performed: No ____________________________________________   INITIAL IMPRESSION / ASSESSMENT AND PLAN / ED COURSE  Pertinent labs & imaging results that were available during my care of the patient were reviewed by me and considered in my medical decision making (see chart for details).  71 y.o. female presenting with nausea and vomiting, diarrhea, starting this morning. Overall, the patient is hemodynamically stable.I am concerned that she may have a viral or foodborne GI illness. Upon reviewing the patient's medical chart, she does have a history of C. Difficile and is at risk for this.Her abdominal examination is reassuring and an acute intra-abdominal pathology is very unlikely; no  imaging of the abdomen is indicated at this time. The patient's chest pain is related to active vomiting, and is likely GI in nature rather than cardiac. Her EKG is unchanged without any ischemia, and we'll get a troponin but ACS or MI is much lower on the differential. We will get stool studies, including C. Difficile, UA, chest x-ray and abdominal x-ray,basic labs, and reevaluate the patient for final disposition.  ----------------------------------------- 11:55 AM on 05/10/2017 -----------------------------------------  At this time, the patient states her nausea has significantly improved but has not resolved. She does have some abnormalities on her abdominal x-ray, so we will follow up with a CT scan. The remainder of her laboratory studies are reassuring, negative for UTI.she is hypokalemic and has been supplemented for this. Her BNP is elevated, but at baseline for her and I do not think she is having acute CHF; her chest x-ray also shows some mild pulmonary congestion, again which is likely baseline. There is no  evidence for significant respiratory distress or an acute change in her cardiac status. Plan reevaluation after CT.  ----------------------------------------- 2:57 PM on 05/10/2017 -----------------------------------------  At this point, the patient's nausea has resolved. She has not had any diarrhea until this time, which will be sent to the lab for C. Difficile and PCR testing. This will be followed up on by the oncoming physicianand treatment will be initiated as needed. The patient's UA does not show infection, she has had hypokalemia which has been supplemented,and her CT scan does not show any acute abnormalities. She does have a mild fat-containing periumbilical hernia, which is not symptomatic on my examination and nointervention is required.  ____________________________________________  FINAL CLINICAL IMPRESSION(S) / ED DIAGNOSES  Final diagnoses:  Hypokalemia  Nausea vomiting and diarrhea         NEW MEDICATIONS STARTED DURING THIS VISIT:  New Prescriptions   LOPERAMIDE (IMODIUM A-D) 2 MG TABLET    Take 1 tablet (2 mg total) by mouth 4 (four) times daily as needed for diarrhea or loose stools.   ONDANSETRON (ZOFRAN ODT) 4 MG DISINTEGRATING TABLET    Take 1 tablet (4 mg total) by mouth every 8 (eight) hours as needed for nausea or vomiting.   PROMETHAZINE (PHENERGAN) 12.5 MG TABLET    Take 1 tablet (12.5 mg total) by mouth every 8 (eight) hours as needed for nausea or vomiting.   PROMETHAZINE (PHENERGAN) 25 MG SUPPOSITORY    Place 1 suppository (25 mg total) rectally every 6 (six) hours as needed for nausea.      Eula Listen, MD 05/10/17 [phone]

## 2017-05-10 NOTE — ED Notes (Addendum)
Pt assisted to ambulate to the commode and back to the stretcher; pt displays an even steady gait; pt called cousin [name] to come pick up the patient.

## 2017-05-10 NOTE — ED Triage Notes (Signed)
Pt c/o nausea, vomiting that began at 7AM today. Denies abdominal pain. Chest tightness after dry heaving began. Denies SOB. 4mg  Zofran given IM with EMS. Pt alert and oriented X4, active, cooperative, pt in NAD. RR even and unlabored, color WNL.

## 2017-05-10 NOTE — ED Notes (Signed)
Vital signs stable. 

## 2017-05-10 NOTE — ED Notes (Signed)

## 2017-05-11 ENCOUNTER — Other Ambulatory Visit: Payer: Self-pay | Admitting: *Deleted

## 2017-05-11 ENCOUNTER — Encounter: Payer: Self-pay | Admitting: *Deleted

## 2017-05-11 NOTE — Patient Outreach (Addendum)
Successful telephone encounter to Meghan [name], 71 year old female - follow up on referral received 05/07/17 from [place] hospital liaison for Community CM services/transition of care/recent hospitalization September 26-28,2018 for Syncope and collapse.  Pt's history includes by not limited to HF, COPD, DM, Depression, Hypertension,GERD.  View in [place] pt also had a recent ED visit  Yesterday for emesis, chest pain, hypokalemia. Spoke with pt, HIPAA identifiers verified, discussed purpose of call - follow up on recent hospital discharge/ ED visit.  Pt reports on recent ED visit-  Had diarrhea bad/nausea- given medication which helped some, chest tightness came  from dry heaves.  Pt reports feel some better today, taking the anti nausea medication, told to do liquid fluid diet, advance to soft diet.   Pt reports no recurrent syncope, no sob, stays dizzy, using walker.    Pt reports to follow up with PCP tomorrow, called public transportation three times today, each time disconnected.  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   Pt reports taking all of her medications as ordered, RN CM unable to review discharge medications with pt  as could not locate them at this time.  Discussed with pt Transition of care program- follow up with weekly calls, a home visit - no cost to pt.    Plan:  As discussed with pt, plan to follow up again next week - initial home visit.           As discussed with pt, to call [facility] health if do not hear from them soon.             Barrier letter sent to Dr. Humphrey Rolls informing of [facility] involvement   Zara Chess.   [facility]  [phone]

## 2017-05-11 NOTE — Patient Outreach (Signed)
[facility]) Care Management  05/11/2017  Meghan [name] 11-May-1946 [id number]   Patient referred to this social worker to by the [facility] hospital liaison to provide patient with community resources for transportation. patient confirmed that she needed the assistance and was the recipient of Medicaid. This social worker explained that she would be able to utilize [employer] at no cost to her as a recipient of Medicaid and provided her with the contact phone number to the Department of [place] [phone] to speak to her Medicaid worker about getting assessed for this service.  This Education officer, museum also provided patient with the contact information for the [facility] [phone] if she needed to utilize this service at cost before her assessment ws complete. Patient verbalized a understanding and stated that she would call to get this service set up.  Patient verbalized having no additional community resource needs at this time. Patient to be closed to [facility] social work.   Sheralyn Boatman [facility] Care Management [phone]

## 2017-05-13 ENCOUNTER — Ambulatory Visit: Payer: Medicare Other | Admitting: Family

## 2017-05-14 DIAGNOSIS — M5030 Other cervical disc degeneration, unspecified cervical region: Secondary | ICD-10-CM | POA: Diagnosis not present

## 2017-05-14 DIAGNOSIS — R011 Cardiac murmur, unspecified: Secondary | ICD-10-CM | POA: Diagnosis not present

## 2017-05-14 DIAGNOSIS — I2510 Atherosclerotic heart disease of native coronary artery without angina pectoris: Secondary | ICD-10-CM | POA: Diagnosis not present

## 2017-05-14 DIAGNOSIS — J449 Chronic obstructive pulmonary disease, unspecified: Secondary | ICD-10-CM | POA: Diagnosis not present

## 2017-05-14 DIAGNOSIS — I208 Other forms of angina pectoris: Secondary | ICD-10-CM | POA: Diagnosis not present

## 2017-05-14 DIAGNOSIS — M1991 Primary osteoarthritis, unspecified site: Secondary | ICD-10-CM | POA: Diagnosis not present

## 2017-05-14 DIAGNOSIS — R55 Syncope and collapse: Secondary | ICD-10-CM | POA: Diagnosis not present

## 2017-05-14 DIAGNOSIS — I110 Hypertensive heart disease with heart failure: Secondary | ICD-10-CM | POA: Diagnosis not present

## 2017-05-14 DIAGNOSIS — J9621 Acute and chronic respiratory failure with hypoxia: Secondary | ICD-10-CM | POA: Diagnosis not present

## 2017-05-14 DIAGNOSIS — R0602 Shortness of breath: Secondary | ICD-10-CM | POA: Diagnosis not present

## 2017-05-14 DIAGNOSIS — I5022 Chronic systolic (congestive) heart failure: Secondary | ICD-10-CM | POA: Diagnosis not present

## 2017-05-14 DIAGNOSIS — M069 Rheumatoid arthritis, unspecified: Secondary | ICD-10-CM | POA: Diagnosis not present

## 2017-05-14 DIAGNOSIS — G43719 Chronic migraine without aura, intractable, without status migrainosus: Secondary | ICD-10-CM | POA: Diagnosis not present

## 2017-05-14 DIAGNOSIS — I509 Heart failure, unspecified: Secondary | ICD-10-CM | POA: Diagnosis not present

## 2017-05-14 DIAGNOSIS — E1140 Type 2 diabetes mellitus with diabetic neuropathy, unspecified: Secondary | ICD-10-CM | POA: Diagnosis not present

## 2017-05-15 DIAGNOSIS — M1991 Primary osteoarthritis, unspecified site: Secondary | ICD-10-CM | POA: Diagnosis not present

## 2017-05-15 DIAGNOSIS — J9621 Acute and chronic respiratory failure with hypoxia: Secondary | ICD-10-CM | POA: Diagnosis not present

## 2017-05-15 DIAGNOSIS — I2510 Atherosclerotic heart disease of native coronary artery without angina pectoris: Secondary | ICD-10-CM | POA: Diagnosis not present

## 2017-05-15 DIAGNOSIS — M5030 Other cervical disc degeneration, unspecified cervical region: Secondary | ICD-10-CM | POA: Diagnosis not present

## 2017-05-15 DIAGNOSIS — I509 Heart failure, unspecified: Secondary | ICD-10-CM | POA: Diagnosis not present

## 2017-05-15 DIAGNOSIS — I110 Hypertensive heart disease with heart failure: Secondary | ICD-10-CM | POA: Diagnosis not present

## 2017-05-15 DIAGNOSIS — M069 Rheumatoid arthritis, unspecified: Secondary | ICD-10-CM | POA: Diagnosis not present

## 2017-05-15 DIAGNOSIS — G43719 Chronic migraine without aura, intractable, without status migrainosus: Secondary | ICD-10-CM | POA: Diagnosis not present

## 2017-05-15 DIAGNOSIS — J449 Chronic obstructive pulmonary disease, unspecified: Secondary | ICD-10-CM | POA: Diagnosis not present

## 2017-05-15 DIAGNOSIS — E1140 Type 2 diabetes mellitus with diabetic neuropathy, unspecified: Secondary | ICD-10-CM | POA: Diagnosis not present

## 2017-05-17 ENCOUNTER — Other Ambulatory Visit: Payer: Self-pay | Admitting: *Deleted

## 2017-05-17 ENCOUNTER — Ambulatory Visit: Payer: Self-pay | Admitting: *Deleted

## 2017-05-17 NOTE — Patient Outreach (Signed)
Successful telephone encounter to Meghan [name], 71 year old female to inform her  of today's initial home visit- will be running late.  Spoke with pt, HIPAA identifiers provided (name, date of birth).  Pt reports still has diarrhea, been off and on for 3 weeks, thought RN CM was coming tomorrow to which informed pt is today- per pt  Wants to cancel home visit.    Pt reports was taking  Imodium OD which did not work so  MD prescribed a medication (does not know the name) which she is to take every 6 hours, works sometimes (view in [place] 10/05 visit with Dr. Clayborn Bigness prescribed Lomotil).  Pt reports she has not called Dr. Humphrey Rolls  yet to schedule office visit, been too sick.   Plan: Per request from pt, to cancel initial home visit today, reschedule for later this week, call pt the day before visit.    Zara Chess.   [facility]  [phone]

## 2017-05-18 DIAGNOSIS — M069 Rheumatoid arthritis, unspecified: Secondary | ICD-10-CM | POA: Diagnosis not present

## 2017-05-18 DIAGNOSIS — I509 Heart failure, unspecified: Secondary | ICD-10-CM | POA: Diagnosis not present

## 2017-05-18 DIAGNOSIS — M5030 Other cervical disc degeneration, unspecified cervical region: Secondary | ICD-10-CM | POA: Diagnosis not present

## 2017-05-18 DIAGNOSIS — I110 Hypertensive heart disease with heart failure: Secondary | ICD-10-CM | POA: Diagnosis not present

## 2017-05-18 DIAGNOSIS — E1140 Type 2 diabetes mellitus with diabetic neuropathy, unspecified: Secondary | ICD-10-CM | POA: Diagnosis not present

## 2017-05-18 DIAGNOSIS — M1991 Primary osteoarthritis, unspecified site: Secondary | ICD-10-CM | POA: Diagnosis not present

## 2017-05-18 DIAGNOSIS — J449 Chronic obstructive pulmonary disease, unspecified: Secondary | ICD-10-CM | POA: Diagnosis not present

## 2017-05-18 DIAGNOSIS — I2510 Atherosclerotic heart disease of native coronary artery without angina pectoris: Secondary | ICD-10-CM | POA: Diagnosis not present

## 2017-05-18 DIAGNOSIS — G43719 Chronic migraine without aura, intractable, without status migrainosus: Secondary | ICD-10-CM | POA: Diagnosis not present

## 2017-05-18 DIAGNOSIS — J9621 Acute and chronic respiratory failure with hypoxia: Secondary | ICD-10-CM | POA: Diagnosis not present

## 2017-05-19 ENCOUNTER — Other Ambulatory Visit: Payer: Self-pay | Admitting: *Deleted

## 2017-05-20 ENCOUNTER — Ambulatory Visit: Payer: Self-pay | Admitting: *Deleted

## 2017-05-20 DIAGNOSIS — J449 Chronic obstructive pulmonary disease, unspecified: Secondary | ICD-10-CM | POA: Diagnosis not present

## 2017-05-20 DIAGNOSIS — M1991 Primary osteoarthritis, unspecified site: Secondary | ICD-10-CM | POA: Diagnosis not present

## 2017-05-20 DIAGNOSIS — G43719 Chronic migraine without aura, intractable, without status migrainosus: Secondary | ICD-10-CM | POA: Diagnosis not present

## 2017-05-20 DIAGNOSIS — I110 Hypertensive heart disease with heart failure: Secondary | ICD-10-CM | POA: Diagnosis not present

## 2017-05-20 DIAGNOSIS — I2510 Atherosclerotic heart disease of native coronary artery without angina pectoris: Secondary | ICD-10-CM | POA: Diagnosis not present

## 2017-05-20 DIAGNOSIS — I509 Heart failure, unspecified: Secondary | ICD-10-CM | POA: Diagnosis not present

## 2017-05-20 DIAGNOSIS — M5030 Other cervical disc degeneration, unspecified cervical region: Secondary | ICD-10-CM | POA: Diagnosis not present

## 2017-05-20 DIAGNOSIS — J9621 Acute and chronic respiratory failure with hypoxia: Secondary | ICD-10-CM | POA: Diagnosis not present

## 2017-05-20 DIAGNOSIS — E1140 Type 2 diabetes mellitus with diabetic neuropathy, unspecified: Secondary | ICD-10-CM | POA: Diagnosis not present

## 2017-05-20 DIAGNOSIS — M069 Rheumatoid arthritis, unspecified: Secondary | ICD-10-CM | POA: Diagnosis not present

## 2017-05-20 NOTE — Patient Outreach (Signed)
Successful telephone encounter to Rockwell [place], 72 year old female to confirm  tomorrow's home visit as requested by pt.  This RN CM following pt for transition of care/recent hospitialization September 26-28,2018 for Syncope and collapse.   Spoke with pt, HIPAA identifiers verified.   Pt reports still having diarrhea, wanted to cancel tomorrow's home visit.   Pt reports HH PT is coming today.   RN CM discussed with pt use of BRAT diet to which pt did the dry toast but not banana,rice or applesauce to which encouraged her  to try.  RN CM discussed with pt ongoing adherence with medications to which pt reports to be honest has not been taking all of her medications like she should since discharge home, don't trust myself/get mixed up.   RN CM discussed with pt doing a [place] referral to which pt declined,  reports suppose to have a [facility] pharmacist do a home visit and [place] RN  Is working with her on her medications.  Pt reports sugars are good with diarrhea.   RN CM discussed with pt rescheduling home visit to which pt requests call back next week.  Discussed with pt  coworker Journalist, newspaper CM covering for this RN CM will be following up next week telephonically (part of transition of care), this RN CM to follow up the following week- schedule home visit.    Plan:  As discussed with pt, coworker Journalist, newspaper CM covering for this RN CM to follow up next week telephonically.           Plan to follow up telephonically the following week, schedule home visit.     Zara Chess.   [place] Care Management  [phone]

## 2017-05-21 ENCOUNTER — Ambulatory Visit: Payer: Medicare Other | Admitting: Family

## 2017-05-24 ENCOUNTER — Other Ambulatory Visit: Payer: Self-pay | Admitting: *Deleted

## 2017-05-24 ENCOUNTER — Encounter: Payer: Self-pay | Admitting: *Deleted

## 2017-05-24 DIAGNOSIS — E1140 Type 2 diabetes mellitus with diabetic neuropathy, unspecified: Secondary | ICD-10-CM | POA: Diagnosis not present

## 2017-05-24 DIAGNOSIS — I2510 Atherosclerotic heart disease of native coronary artery without angina pectoris: Secondary | ICD-10-CM | POA: Diagnosis not present

## 2017-05-24 DIAGNOSIS — I509 Heart failure, unspecified: Secondary | ICD-10-CM | POA: Diagnosis not present

## 2017-05-24 DIAGNOSIS — G43719 Chronic migraine without aura, intractable, without status migrainosus: Secondary | ICD-10-CM | POA: Diagnosis not present

## 2017-05-24 DIAGNOSIS — M069 Rheumatoid arthritis, unspecified: Secondary | ICD-10-CM | POA: Diagnosis not present

## 2017-05-24 DIAGNOSIS — M1991 Primary osteoarthritis, unspecified site: Secondary | ICD-10-CM | POA: Diagnosis not present

## 2017-05-24 DIAGNOSIS — J449 Chronic obstructive pulmonary disease, unspecified: Secondary | ICD-10-CM | POA: Diagnosis not present

## 2017-05-24 DIAGNOSIS — M5030 Other cervical disc degeneration, unspecified cervical region: Secondary | ICD-10-CM | POA: Diagnosis not present

## 2017-05-24 DIAGNOSIS — J9621 Acute and chronic respiratory failure with hypoxia: Secondary | ICD-10-CM | POA: Diagnosis not present

## 2017-05-24 DIAGNOSIS — I110 Hypertensive heart disease with heart failure: Secondary | ICD-10-CM | POA: Diagnosis not present

## 2017-05-24 NOTE — Patient Outreach (Signed)
[facility]) Care Management [place] Telephone Outreach, Transition of Care  05/24/2017  Meghan [name] 17-Mar-1946 [id number]  Successful telephone outreach to Meghan [name], 71 y/o female referred to [place] for transition of care after recent hospitalization September 26-28, 2018 for syncope/ collapse.  Patient was discharged home to self-care with home health services in place.  Patient has history including, but not limited to, HTN, CHF, COPD, and DM.  HIPAA/ identity verified with patient during phone call today.  Today, patient reports that she "is doing fine, but still a little weak."  Patient denies pain today and sounds to be in no apparent or obvious distress throughout entirety of today's phone call.  I explained that I was contacting patient for primary [facility] RN CM Kathie Rhodes), and patient verbalized understanding.  Patient further reports: -- has and is taking all medications as prescribed; states that she is not currently near her medications, but she assures me that she has "all of them," and is taking.   -- home health services continue; PT visited patient today, stated "visit went just fine."  Reports that [place] has helped her manage her medications, and that [facility] RN "Merry Proud" has notified her PCP of "three medications" that she is currently taking, but these medications are not on her active medication list.  Patient can not tell me which medications these three are, but stated that "Merry Proud is handling it."  Patient again states she is not near her medications and does not wish to go over medications during phone call today. -- patient reports she does not know of a pharmacist through her insurance that is currently assisting her with medications, and states that her [place] is "handling" her medications. -- reports ongoing diarrhea "is somewhat better;" states her bowel movements are becoming more formed, and she is having "less" episodes of diarrhea/  loose stools; states that she has been following bland diet to help, and that she believes this "is working." -- denies recent episodes of dizziness, new falls; discussed with patient signs/ symptoms of impending syncope, and encouraged her to sit down/ lie down/ rest for any dizziness if she experiences any, and she verbalized understanding and agreement.  Reports using walker for mobility/ fall prevention "every morning" and whenever she feels weak during day/ night time hours.  Encouraged patient to keep walker nearby in case she were to need to use it suddenly, and she states she does keep it near her. -- attended cardiology provider appointment "about 2 weeks ago," where she states she was placed on a heart monitor; reports she does not yet have a PCP office visit scheduled; encouraged patient to schedule this appointment as soon as possible, and explained that PCP follow up appointment is very important after hospitalization, for purposes of medication clarification and overall plan of care.  Patient stated she would try to schedule this appointment "soon." -- reports checking blood sugars "at least once a day at night, or more" if she feels "shaky."  Reports blood sugar ranges of 129-140 at night, with blood sugar of "140" last night.  Patient endorses taking medications for DM, but she can not tell me the name of the medication today, stating she does not remember.    Patient denies further issues, concerns, or problems today.  I provided patient with my direct phone number, and confirmed that she has the main [place] office phone number, and the [facility] CM 24-hour nurse advice phone number should  issues arise prior to next scheduled [place] outreach, and shared with patient that Kalman Shan would contact patient again by phone next week for ongoing transition of care; patient verbalized understanding and agreement with this plan.  Plan:  Will update patient's primary [facility] RN CM of today's successful  telephone outreach to patient  Oneta Rack, RN, BSN, [place] Coordinator [facility] Care Management  [phone]

## 2017-05-26 DIAGNOSIS — E1140 Type 2 diabetes mellitus with diabetic neuropathy, unspecified: Secondary | ICD-10-CM | POA: Diagnosis not present

## 2017-05-26 DIAGNOSIS — J9621 Acute and chronic respiratory failure with hypoxia: Secondary | ICD-10-CM | POA: Diagnosis not present

## 2017-05-26 DIAGNOSIS — I509 Heart failure, unspecified: Secondary | ICD-10-CM | POA: Diagnosis not present

## 2017-05-26 DIAGNOSIS — G43719 Chronic migraine without aura, intractable, without status migrainosus: Secondary | ICD-10-CM | POA: Diagnosis not present

## 2017-05-26 DIAGNOSIS — M069 Rheumatoid arthritis, unspecified: Secondary | ICD-10-CM | POA: Diagnosis not present

## 2017-05-26 DIAGNOSIS — I2510 Atherosclerotic heart disease of native coronary artery without angina pectoris: Secondary | ICD-10-CM | POA: Diagnosis not present

## 2017-05-26 DIAGNOSIS — M1991 Primary osteoarthritis, unspecified site: Secondary | ICD-10-CM | POA: Diagnosis not present

## 2017-05-26 DIAGNOSIS — I110 Hypertensive heart disease with heart failure: Secondary | ICD-10-CM | POA: Diagnosis not present

## 2017-05-26 DIAGNOSIS — J449 Chronic obstructive pulmonary disease, unspecified: Secondary | ICD-10-CM | POA: Diagnosis not present

## 2017-05-26 DIAGNOSIS — M5030 Other cervical disc degeneration, unspecified cervical region: Secondary | ICD-10-CM | POA: Diagnosis not present

## 2017-05-28 DIAGNOSIS — M069 Rheumatoid arthritis, unspecified: Secondary | ICD-10-CM | POA: Diagnosis not present

## 2017-05-28 DIAGNOSIS — G43719 Chronic migraine without aura, intractable, without status migrainosus: Secondary | ICD-10-CM | POA: Diagnosis not present

## 2017-05-28 DIAGNOSIS — I2510 Atherosclerotic heart disease of native coronary artery without angina pectoris: Secondary | ICD-10-CM | POA: Diagnosis not present

## 2017-05-28 DIAGNOSIS — J449 Chronic obstructive pulmonary disease, unspecified: Secondary | ICD-10-CM | POA: Diagnosis not present

## 2017-05-28 DIAGNOSIS — I509 Heart failure, unspecified: Secondary | ICD-10-CM | POA: Diagnosis not present

## 2017-05-28 DIAGNOSIS — M5030 Other cervical disc degeneration, unspecified cervical region: Secondary | ICD-10-CM | POA: Diagnosis not present

## 2017-05-28 DIAGNOSIS — M1991 Primary osteoarthritis, unspecified site: Secondary | ICD-10-CM | POA: Diagnosis not present

## 2017-05-28 DIAGNOSIS — E1140 Type 2 diabetes mellitus with diabetic neuropathy, unspecified: Secondary | ICD-10-CM | POA: Diagnosis not present

## 2017-05-28 DIAGNOSIS — I110 Hypertensive heart disease with heart failure: Secondary | ICD-10-CM | POA: Diagnosis not present

## 2017-05-28 DIAGNOSIS — J9621 Acute and chronic respiratory failure with hypoxia: Secondary | ICD-10-CM | POA: Diagnosis not present

## 2017-05-31 ENCOUNTER — Other Ambulatory Visit: Payer: Self-pay | Admitting: *Deleted

## 2017-05-31 NOTE — Patient Outreach (Signed)
Successful telephone encounter to Meghan [name], 71 year old female for transition of care/ongoing follow up on recent hospitalization September 26-28,2018 for Syncope and collapse.   Pt's history includes but not limited to HF, COPD,Depression,Hypertension, GERD.  Spoke with pt, HIPAA identifiers verified.   Pt reports on diarrhea status, came back down with it last night, better today.  Pt reports she was back eating regular food, had a salad, started back which now is going to avoid.  Pt reports is a little dizzy going from sitting to standing, will stand a few minutes/better,no recent falls/using her walker.   Pt reports taking all of her medications as ordered, [facility] RN filling a weekly pill box.  Pt reports HH PT comes twice a week.  Pt reports she has not scheduled an appointment to follow up with PCP as has an appointment to see him next month/thinks early in the month.   RN CM discussed with pt doing a home visit this week to which pt agreed.   Plan:  As discussed with pt, plan to follow up again this week - initial home visit (part of ongoing transition of care).     Zara Chess.   [place] Care Management  [phone]

## 2017-06-01 DIAGNOSIS — M5030 Other cervical disc degeneration, unspecified cervical region: Secondary | ICD-10-CM | POA: Diagnosis not present

## 2017-06-01 DIAGNOSIS — J9621 Acute and chronic respiratory failure with hypoxia: Secondary | ICD-10-CM | POA: Diagnosis not present

## 2017-06-01 DIAGNOSIS — I110 Hypertensive heart disease with heart failure: Secondary | ICD-10-CM | POA: Diagnosis not present

## 2017-06-01 DIAGNOSIS — I509 Heart failure, unspecified: Secondary | ICD-10-CM | POA: Diagnosis not present

## 2017-06-01 DIAGNOSIS — I2510 Atherosclerotic heart disease of native coronary artery without angina pectoris: Secondary | ICD-10-CM | POA: Diagnosis not present

## 2017-06-01 DIAGNOSIS — M069 Rheumatoid arthritis, unspecified: Secondary | ICD-10-CM | POA: Diagnosis not present

## 2017-06-01 DIAGNOSIS — G43719 Chronic migraine without aura, intractable, without status migrainosus: Secondary | ICD-10-CM | POA: Diagnosis not present

## 2017-06-01 DIAGNOSIS — M1991 Primary osteoarthritis, unspecified site: Secondary | ICD-10-CM | POA: Diagnosis not present

## 2017-06-01 DIAGNOSIS — J449 Chronic obstructive pulmonary disease, unspecified: Secondary | ICD-10-CM | POA: Diagnosis not present

## 2017-06-01 DIAGNOSIS — E1140 Type 2 diabetes mellitus with diabetic neuropathy, unspecified: Secondary | ICD-10-CM | POA: Diagnosis not present

## 2017-06-02 ENCOUNTER — Encounter: Payer: Self-pay | Admitting: *Deleted

## 2017-06-02 ENCOUNTER — Other Ambulatory Visit: Payer: Self-pay | Admitting: *Deleted

## 2017-06-02 ENCOUNTER — Ambulatory Visit: Payer: Self-pay | Admitting: *Deleted

## 2017-06-02 NOTE — Patient Outreach (Signed)
[facility]) Care Management   06/02/2017  Meghan [name] 03-Aug-1946 [id number]  Meghan [name] is an 71 y.o. female  Subjective:  Pt reports diarrhea better, still have at least one loose stool a day, appetite  Good.   Pt reports last visit with Heart MD, MD talked about doing a pacemaker, currently  Wearing a heart monitor, to call Heart MD today to see how long will have to stay in  [name] realized missed all of her MD appointments due to being in the  Hospital, has no upcoming PCP appointment, wants to wait to see Heart MD before making An appointment.   Pt reports no sob or chest pain today, dizziness when standing, continues To work with [facility] PT- strength and walking better, using walker at all times.   Pt reports Taking all of her medications, HH RN still doing weekly pill planner, inquired of RN CM  To call pharmacy to see if nebulizer medication ready for pick up/have any more medications.  Objective:   Vitals:   06/02/17 1426  BP: 94/62  Pulse: 85  Resp: 16  SpO2: 98%    ROS  Physical Exam  Constitutional: She is oriented to person, place, and time. She appears well-developed and well-nourished.  Cardiovascular: Regular rhythm and normal heart sounds.   HR ranges 85-101. Heart monitor to pt's waist.   Respiratory: Effort normal and breath sounds normal.  GI: Soft.  Musculoskeletal: Normal range of motion. She exhibits edema.  Using walker.   Edema noted in right hand.   Neurological: She is alert and oriented to person, place, and time.  Skin: Skin is warm and dry.  Psychiatric: She has a normal mood and affect. Her behavior is normal. Judgment and thought content normal.    Encounter Medications:  Patient was recently discharged from hospital and all medications have been reviewed.    Outpatient Encounter Prescriptions as of 06/02/2017  Medication Sig Note  . budesonide (PULMICORT) 0.5 MG/2ML nebulizer solution Take 2 mLs (0.5 mg  total) by nebulization 2 (two) times daily. 05/10/2017: Pt uses as needed  . busPIRone (BUSPAR) 7.5 MG tablet Take 1 tablet by mouth 2 (two) times daily.   . carvedilol (COREG) 3.125 MG tablet Take 1 tablet (3.125 mg total) by mouth 2 (two) times daily with a meal.   . diphenoxylate-atropine (LOMOTIL) 2.5-0.025 MG tablet Take 1 tablet by mouth 3 (three) times daily as needed for diarrhea or loose stools.   Marland Kitchen escitalopram (LEXAPRO) 10 MG tablet Take 1 tablet (10 mg total) by mouth every morning.   . feeding supplement, ENSURE ENLIVE, (ENSURE ENLIVE) LIQD Take 237 mLs by mouth 2 (two) times daily between meals.   . furosemide (LASIX) 40 MG tablet Take 0.5 tablets (20 mg total) by mouth daily.   . insulin starter kit- pen needles MISC 1 kit by Other route once.   Marland Kitchen ipratropium-albuterol (DUONEB) 0.5-2.5 (3) MG/3ML SOLN Take 3 mLs by nebulization 2 (two) times daily. 05/10/2017: Pt uses as needed  . lamoTRIgine (LAMICTAL) 100 MG tablet Take 1 tablet (100 mg total) by mouth daily.   Marland Kitchen lisinopril (PRINIVIL,ZESTRIL) 2.5 MG tablet Take 1 tablet by mouth 2 (two) times daily.   Marland Kitchen loperamide (IMODIUM A-D) 2 MG tablet Take 1 tablet (2 mg total) by mouth 4 (four) times daily as needed for diarrhea or loose stools.   . mirtazapine (REMERON) 7.5 MG tablet Take 7.5 mg by mouth at bedtime.    Marland Kitchen  omeprazole (PRILOSEC) 20 MG capsule Take 20 mg by mouth at bedtime.   . ondansetron (ZOFRAN ODT) 4 MG disintegrating tablet Take 1 tablet (4 mg total) by mouth every 8 (eight) hours as needed for nausea or vomiting.   . ondansetron (ZOFRAN) 4 MG tablet Take 4 mg by mouth every 3 (three) hours as needed for nausea or vomiting.    . Potassium Chloride ER 20 MEQ TBCR Take 20 mEq by mouth 2 (two) times daily.   . pramipexole (MIRAPEX) 0.25 MG tablet Take 0.25 mg by mouth daily.    . pregabalin (LYRICA) 50 MG capsule TAKE ONE CAPSULE BY MOUTH TWICE A DAY   . promethazine (PHENERGAN) 12.5 MG tablet Take 1 tablet (12.5 mg total) by  mouth every 8 (eight) hours as needed for nausea or vomiting.   . promethazine (PHENERGAN) 25 MG suppository Place 1 suppository (25 mg total) rectally every 6 (six) hours as needed for nausea.   . saxagliptin HCl (ONGLYZA) 5 MG TABS tablet Take 5 mg by mouth at bedtime.   . topiramate (TOPAMAX) 25 MG tablet Take 25 mg by mouth at bedtime.     No facility-administered encounter medications on file as of 06/02/2017.     Functional Status:   In your present state of health, do you have any difficulty performing the following activities: 06/02/2017 05/05/2017  Hearing? Y Y  Comment problems with right ear  -  Vision? N N  Difficulty concentrating or making decisions? N N  Walking or climbing stairs? Y Y  Dressing or bathing? N Y  Doing errands, shopping? [name]  Preparing Food and eating ? N -  Using the Toilet? N -  In the past six months, have you accidently leaked urine? N -  Do you have problems with loss of bowel control? Y -  Managing your Medications? Y -  Managing your Finances? Y -  Housekeeping or managing your Housekeeping? N -  Some recent data might be hidden    Fall/Depression Screening:    Fall Risk  05/24/2017 05/11/2017 06/30/2016  Falls in the past year? (No Data) Yes Yes  Comment No new falls reported by patient today - -  Number falls in past yr: - 1 1  Injury with Fall? - No Yes  Risk Factor Category  - High Fall Risk -  Risk for fall due to : - Impaired balance/gait Other (Comment)  Risk for fall due to: Comment - - has back condition  Follow up - Falls prevention discussed Falls prevention discussed   PHQ 2/9 Scores 05/11/2017 06/30/2016  PHQ - 2 Score 2 1  PHQ- 9 Score 5 -    Assessment:  Pleasant 71 year old female, lives alone in apartment, friend Meghan [name] assists as needed.  This RN CM following pt for transition of care/recent hospitalization September 26-28,2018 for  Syncope and collapse.   Pt's history includes but not limited to HF,  COPD,GERD,DM,HTN, Depression.      CAD:  No complaints of sob, chest pain.   HR ranges today 85-101.  Heart monitor device       Connected to pt's waist.     HF:  Per pt weight today 118 lbs, being monitored by [facility] HF program/scale provided.       Edema (small) noted on right hand.    Medications:  Per pt's request, called her pharmacy about nebulizer medication- Duoneb,      Informed ready for pick up, also per pt requested  refill on her Lasix.    Plan:  As discussed with pt, plan to continue to provide [facility] CM services for transition     Of care, follow up again next week telephonically.             Plan to send Dr. Humphrey Rolls 05/23/17 initial home visit encounter.  [facility] CM Care Plan Problem One     Most Recent Value  Care Plan Problem One  Risk for readmission related to recent hospitalization for syncope and collapse   Role Documenting the Problem One  Care Management [place] for Problem One  Active  THN Long Term Goal   Pt would not readmit to the hospital within the next 31 days   THN Long Term Goal Start Date  05/11/17  Interventions for Problem One Long Term Goal  Initial home visit/assessement done, discussed ongoing [place] services.   THN CM Short Term Goal #1   Pt would keep all MD appointments in the next 30 days   THN CM Short Term Goal #1 Start Date  05/11/17  Interventions for Short Term Goal #1  Encouraged pt to schedule post discharge PCP visit after seeing Heart MD about pacemaker placement   THN CM Short Term Goal #2   Pt would take all medications as as ordered for the next 30 days   THN CM Short Term Goal #2 Start Date  05/11/17  Interventions for Short Term Goal #2  Medication review completed with pt today, using pill planner prepared by [facility] RN, per pt's request called her pharmacy on status of neb medication, refill needed on Lasix      Quade Ramirez M.   [place] Care Management  [phone]

## 2017-06-03 DIAGNOSIS — J449 Chronic obstructive pulmonary disease, unspecified: Secondary | ICD-10-CM | POA: Diagnosis not present

## 2017-06-03 DIAGNOSIS — I509 Heart failure, unspecified: Secondary | ICD-10-CM | POA: Diagnosis not present

## 2017-06-03 DIAGNOSIS — M069 Rheumatoid arthritis, unspecified: Secondary | ICD-10-CM | POA: Diagnosis not present

## 2017-06-03 DIAGNOSIS — M5030 Other cervical disc degeneration, unspecified cervical region: Secondary | ICD-10-CM | POA: Diagnosis not present

## 2017-06-03 DIAGNOSIS — I2510 Atherosclerotic heart disease of native coronary artery without angina pectoris: Secondary | ICD-10-CM | POA: Diagnosis not present

## 2017-06-03 DIAGNOSIS — M1991 Primary osteoarthritis, unspecified site: Secondary | ICD-10-CM | POA: Diagnosis not present

## 2017-06-03 DIAGNOSIS — G43719 Chronic migraine without aura, intractable, without status migrainosus: Secondary | ICD-10-CM | POA: Diagnosis not present

## 2017-06-03 DIAGNOSIS — E1140 Type 2 diabetes mellitus with diabetic neuropathy, unspecified: Secondary | ICD-10-CM | POA: Diagnosis not present

## 2017-06-03 DIAGNOSIS — J9621 Acute and chronic respiratory failure with hypoxia: Secondary | ICD-10-CM | POA: Diagnosis not present

## 2017-06-03 DIAGNOSIS — I110 Hypertensive heart disease with heart failure: Secondary | ICD-10-CM | POA: Diagnosis not present

## 2017-06-04 DIAGNOSIS — G43719 Chronic migraine without aura, intractable, without status migrainosus: Secondary | ICD-10-CM | POA: Diagnosis not present

## 2017-06-04 DIAGNOSIS — I110 Hypertensive heart disease with heart failure: Secondary | ICD-10-CM | POA: Diagnosis not present

## 2017-06-04 DIAGNOSIS — I509 Heart failure, unspecified: Secondary | ICD-10-CM | POA: Diagnosis not present

## 2017-06-04 DIAGNOSIS — J449 Chronic obstructive pulmonary disease, unspecified: Secondary | ICD-10-CM | POA: Diagnosis not present

## 2017-06-04 DIAGNOSIS — M5030 Other cervical disc degeneration, unspecified cervical region: Secondary | ICD-10-CM | POA: Diagnosis not present

## 2017-06-04 DIAGNOSIS — E1140 Type 2 diabetes mellitus with diabetic neuropathy, unspecified: Secondary | ICD-10-CM | POA: Diagnosis not present

## 2017-06-04 DIAGNOSIS — I2510 Atherosclerotic heart disease of native coronary artery without angina pectoris: Secondary | ICD-10-CM | POA: Diagnosis not present

## 2017-06-04 DIAGNOSIS — J9621 Acute and chronic respiratory failure with hypoxia: Secondary | ICD-10-CM | POA: Diagnosis not present

## 2017-06-04 DIAGNOSIS — M069 Rheumatoid arthritis, unspecified: Secondary | ICD-10-CM | POA: Diagnosis not present

## 2017-06-04 DIAGNOSIS — M1991 Primary osteoarthritis, unspecified site: Secondary | ICD-10-CM | POA: Diagnosis not present

## 2017-06-09 ENCOUNTER — Emergency Department: Payer: Medicare Other

## 2017-06-09 ENCOUNTER — Encounter: Payer: Self-pay | Admitting: *Deleted

## 2017-06-09 ENCOUNTER — Inpatient Hospital Stay
Admission: EM | Admit: 2017-06-09 | Discharge: 2017-06-15 | DRG: 478 | Disposition: A | Discharge status: Skilled Nursing Facility | Payer: Medicare Other | Attending: Specialist | Admitting: Specialist

## 2017-06-09 DIAGNOSIS — R296 Repeated falls: Secondary | ICD-10-CM | POA: Diagnosis not present

## 2017-06-09 DIAGNOSIS — I10 Essential (primary) hypertension: Secondary | ICD-10-CM | POA: Diagnosis not present

## 2017-06-09 DIAGNOSIS — M6282 Rhabdomyolysis: Secondary | ICD-10-CM | POA: Diagnosis present

## 2017-06-09 DIAGNOSIS — J449 Chronic obstructive pulmonary disease, unspecified: Secondary | ICD-10-CM | POA: Diagnosis present

## 2017-06-09 DIAGNOSIS — S32010A Wedge compression fracture of first lumbar vertebra, initial encounter for closed fracture: Secondary | ICD-10-CM | POA: Diagnosis not present

## 2017-06-09 DIAGNOSIS — Z23 Encounter for immunization: Secondary | ICD-10-CM | POA: Diagnosis present

## 2017-06-09 DIAGNOSIS — Z79899 Other long term (current) drug therapy: Secondary | ICD-10-CM | POA: Diagnosis not present

## 2017-06-09 DIAGNOSIS — Z886 Allergy status to analgesic agent status: Secondary | ICD-10-CM

## 2017-06-09 DIAGNOSIS — I110 Hypertensive heart disease with heart failure: Secondary | ICD-10-CM | POA: Diagnosis present

## 2017-06-09 DIAGNOSIS — E876 Hypokalemia: Secondary | ICD-10-CM | POA: Diagnosis present

## 2017-06-09 DIAGNOSIS — E7800 Pure hypercholesterolemia, unspecified: Secondary | ICD-10-CM | POA: Diagnosis not present

## 2017-06-09 DIAGNOSIS — Z419 Encounter for procedure for purposes other than remedying health state, unspecified: Secondary | ICD-10-CM

## 2017-06-09 DIAGNOSIS — I2510 Atherosclerotic heart disease of native coronary artery without angina pectoris: Secondary | ICD-10-CM | POA: Diagnosis not present

## 2017-06-09 DIAGNOSIS — R42 Dizziness and giddiness: Secondary | ICD-10-CM | POA: Diagnosis not present

## 2017-06-09 DIAGNOSIS — I5042 Chronic combined systolic (congestive) and diastolic (congestive) heart failure: Secondary | ICD-10-CM | POA: Diagnosis present

## 2017-06-09 DIAGNOSIS — R17 Unspecified jaundice: Secondary | ICD-10-CM | POA: Diagnosis present

## 2017-06-09 DIAGNOSIS — F419 Anxiety disorder, unspecified: Secondary | ICD-10-CM | POA: Diagnosis present

## 2017-06-09 DIAGNOSIS — Z7401 Bed confinement status: Secondary | ICD-10-CM | POA: Diagnosis not present

## 2017-06-09 DIAGNOSIS — M25551 Pain in right hip: Secondary | ICD-10-CM | POA: Diagnosis present

## 2017-06-09 DIAGNOSIS — M5136 Other intervertebral disc degeneration, lumbar region: Secondary | ICD-10-CM | POA: Diagnosis not present

## 2017-06-09 DIAGNOSIS — M069 Rheumatoid arthritis, unspecified: Secondary | ICD-10-CM | POA: Diagnosis not present

## 2017-06-09 DIAGNOSIS — J45998 Other asthma: Secondary | ICD-10-CM | POA: Diagnosis not present

## 2017-06-09 DIAGNOSIS — M810 Age-related osteoporosis without current pathological fracture: Secondary | ICD-10-CM | POA: Diagnosis present

## 2017-06-09 DIAGNOSIS — E119 Type 2 diabetes mellitus without complications: Secondary | ICD-10-CM | POA: Diagnosis not present

## 2017-06-09 DIAGNOSIS — M545 Low back pain: Secondary | ICD-10-CM | POA: Diagnosis not present

## 2017-06-09 DIAGNOSIS — R269 Unspecified abnormalities of gait and mobility: Secondary | ICD-10-CM | POA: Diagnosis not present

## 2017-06-09 DIAGNOSIS — S3992XA Unspecified injury of lower back, initial encounter: Secondary | ICD-10-CM | POA: Diagnosis not present

## 2017-06-09 DIAGNOSIS — K219 Gastro-esophageal reflux disease without esophagitis: Secondary | ICD-10-CM | POA: Diagnosis present

## 2017-06-09 DIAGNOSIS — I25119 Atherosclerotic heart disease of native coronary artery with unspecified angina pectoris: Secondary | ICD-10-CM | POA: Diagnosis not present

## 2017-06-09 DIAGNOSIS — I509 Heart failure, unspecified: Secondary | ICD-10-CM | POA: Diagnosis not present

## 2017-06-09 DIAGNOSIS — Z981 Arthrodesis status: Secondary | ICD-10-CM | POA: Diagnosis not present

## 2017-06-09 DIAGNOSIS — Z66 Do not resuscitate: Secondary | ICD-10-CM | POA: Diagnosis present

## 2017-06-09 DIAGNOSIS — M4856XA Collapsed vertebra, not elsewhere classified, lumbar region, initial encounter for fracture: Principal | ICD-10-CM | POA: Diagnosis present

## 2017-06-09 DIAGNOSIS — M6281 Muscle weakness (generalized): Secondary | ICD-10-CM | POA: Diagnosis not present

## 2017-06-09 DIAGNOSIS — Z96653 Presence of artificial knee joint, bilateral: Secondary | ICD-10-CM | POA: Diagnosis present

## 2017-06-09 DIAGNOSIS — S79911A Unspecified injury of right hip, initial encounter: Secondary | ICD-10-CM | POA: Diagnosis not present

## 2017-06-09 DIAGNOSIS — J9621 Acute and chronic respiratory failure with hypoxia: Secondary | ICD-10-CM | POA: Diagnosis not present

## 2017-06-09 DIAGNOSIS — R1013 Epigastric pain: Secondary | ICD-10-CM | POA: Diagnosis not present

## 2017-06-09 DIAGNOSIS — M0589 Other rheumatoid arthritis with rheumatoid factor of multiple sites: Secondary | ICD-10-CM | POA: Diagnosis not present

## 2017-06-09 DIAGNOSIS — W19XXXA Unspecified fall, initial encounter: Secondary | ICD-10-CM

## 2017-06-09 DIAGNOSIS — R7989 Other specified abnormal findings of blood chemistry: Secondary | ICD-10-CM | POA: Diagnosis not present

## 2017-06-09 DIAGNOSIS — M79642 Pain in left hand: Secondary | ICD-10-CM | POA: Diagnosis not present

## 2017-06-09 DIAGNOSIS — J45909 Unspecified asthma, uncomplicated: Secondary | ICD-10-CM | POA: Diagnosis not present

## 2017-06-09 DIAGNOSIS — R748 Abnormal levels of other serum enzymes: Secondary | ICD-10-CM | POA: Diagnosis not present

## 2017-06-09 DIAGNOSIS — R531 Weakness: Secondary | ICD-10-CM | POA: Diagnosis not present

## 2017-06-09 DIAGNOSIS — R55 Syncope and collapse: Secondary | ICD-10-CM | POA: Diagnosis not present

## 2017-06-09 DIAGNOSIS — R000 Tachycardia, unspecified: Secondary | ICD-10-CM | POA: Diagnosis not present

## 2017-06-09 DIAGNOSIS — F4310 Post-traumatic stress disorder, unspecified: Secondary | ICD-10-CM | POA: Diagnosis present

## 2017-06-09 DIAGNOSIS — F329 Major depressive disorder, single episode, unspecified: Secondary | ICD-10-CM | POA: Diagnosis present

## 2017-06-09 DIAGNOSIS — R0781 Pleurodynia: Secondary | ICD-10-CM | POA: Diagnosis not present

## 2017-06-09 DIAGNOSIS — R011 Cardiac murmur, unspecified: Secondary | ICD-10-CM | POA: Diagnosis not present

## 2017-06-09 LAB — CBC WITH DIFFERENTIAL/PLATELET
BASOS ABS: 0 10*3/uL (ref 0–0.1)
BASOS PCT: 1 %
EOS ABS: 0 10*3/uL (ref 0–0.7)
EOS PCT: 0 %
HCT: 38.3 % (ref 35.0–47.0)
HEMOGLOBIN: 12.8 g/dL (ref 12.0–16.0)
LYMPHS ABS: 0.7 10*3/uL — AB (ref 1.0–3.6)
Lymphocytes Relative: 12 %
MCH: 29 pg (ref 26.0–34.0)
MCHC: 33.4 g/dL (ref 32.0–36.0)
MCV: 87 fL (ref 80.0–100.0)
Monocytes Absolute: 0.5 10*3/uL (ref 0.2–0.9)
Monocytes Relative: 8 %
NEUTROS PCT: 79 %
Neutro Abs: 4.3 10*3/uL (ref 1.4–6.5)
PLATELETS: 284 10*3/uL (ref 150–440)
RBC: 4.4 MIL/uL (ref 3.80–5.20)
RDW: 14.4 % (ref 11.5–14.5)
WBC: 5.4 10*3/uL (ref 3.6–11.0)

## 2017-06-09 LAB — URINALYSIS, COMPLETE (UACMP) WITH MICROSCOPIC
BILIRUBIN URINE: NEGATIVE
Bacteria, UA: NONE SEEN
Glucose, UA: NEGATIVE mg/dL
HGB URINE DIPSTICK: NEGATIVE
Ketones, ur: 80 mg/dL — AB
LEUKOCYTES UA: NEGATIVE
Nitrite: NEGATIVE
PH: 6 (ref 5.0–8.0)
Protein, ur: 100 mg/dL — AB
SPECIFIC GRAVITY, URINE: 1.021 (ref 1.005–1.030)

## 2017-06-09 LAB — COMPREHENSIVE METABOLIC PANEL
ALBUMIN: 2.6 g/dL — AB (ref 3.5–5.0)
ALK PHOS: 77 U/L (ref 38–126)
ALT: 25 U/L (ref 14–54)
AST: 45 U/L — AB (ref 15–41)
Anion gap: 16 — ABNORMAL HIGH (ref 5–15)
BUN: 11 mg/dL (ref 6–20)
CHLORIDE: 108 mmol/L (ref 101–111)
CO2: 17 mmol/L — AB (ref 22–32)
Calcium: 8.1 mg/dL — ABNORMAL LOW (ref 8.9–10.3)
Creatinine, Ser: 0.53 mg/dL (ref 0.44–1.00)
GFR calc Af Amer: >60 mL/min (ref >60)
GFR calc non Af Amer: >60 mL/min (ref >60)
Glucose, Bld: 100 mg/dL — ABNORMAL HIGH (ref 65–99)
POTASSIUM: 2.8 mmol/L — AB (ref 3.5–5.1)
SODIUM: 141 mmol/L (ref 135–145)
Total Bilirubin: 1.9 mg/dL — ABNORMAL HIGH (ref 0.3–1.2)
Total Protein: 5.8 g/dL — ABNORMAL LOW (ref 6.5–8.1)

## 2017-06-09 LAB — GLUCOSE, CAPILLARY: Glucose-Capillary: 123 mg/dL — ABNORMAL HIGH (ref 65–99)

## 2017-06-09 LAB — TROPONIN I: TROPONIN I: 0.07 ng/mL — AB (ref <0.03)

## 2017-06-09 LAB — CK: Total CK: 457 U/L — ABNORMAL HIGH (ref 38–234)

## 2017-06-09 MED ORDER — OXYCODONE-ACETAMINOPHEN 5-325 MG PO TABS
1.0000 | ORAL_TABLET | Freq: Four times a day (QID) | ORAL | Status: DC | PRN
  Administered 2017-06-09 – 2017-06-12 (×6): 1 via ORAL
  Filled 2017-06-09 (×5): qty 1

## 2017-06-09 MED ORDER — FUROSEMIDE 20 MG PO TABS
20.0000 mg | ORAL_TABLET | Freq: Every day | ORAL | Status: DC
  Administered 2017-06-11 – 2017-06-15 (×4): 20 mg via ORAL
  Filled 2017-06-09 (×5): qty 1

## 2017-06-09 MED ORDER — OXYCODONE-ACETAMINOPHEN 5-325 MG PO TABS
ORAL_TABLET | ORAL | Status: AC
  Administered 2017-06-09: 1 via ORAL
  Filled 2017-06-09: qty 1

## 2017-06-09 MED ORDER — POTASSIUM CHLORIDE CRYS ER 20 MEQ PO TBCR
40.0000 meq | EXTENDED_RELEASE_TABLET | Freq: Once | ORAL | Status: AC
  Administered 2017-06-09: 40 meq via ORAL
  Filled 2017-06-09: qty 2

## 2017-06-09 MED ORDER — LISINOPRIL 5 MG PO TABS
2.5000 mg | ORAL_TABLET | Freq: Two times a day (BID) | ORAL | Status: DC
  Administered 2017-06-09 – 2017-06-10 (×2): 2.5 mg via ORAL
  Filled 2017-06-09 (×4): qty 1

## 2017-06-09 MED ORDER — BUSPIRONE HCL 5 MG PO TABS
7.5000 mg | ORAL_TABLET | Freq: Two times a day (BID) | ORAL | Status: DC
  Administered 2017-06-10 – 2017-06-15 (×9): 7.5 mg via ORAL
  Filled 2017-06-09 (×12): qty 1.5

## 2017-06-09 MED ORDER — CLOPIDOGREL BISULFATE 75 MG PO TABS
75.0000 mg | ORAL_TABLET | Freq: Every day | ORAL | Status: DC
  Administered 2017-06-09 – 2017-06-10 (×2): 75 mg via ORAL
  Filled 2017-06-09 (×2): qty 1

## 2017-06-09 MED ORDER — ACETAMINOPHEN 650 MG RE SUPP
650.0000 mg | Freq: Four times a day (QID) | RECTAL | Status: DC | PRN
Start: 2017-06-09 — End: 2017-06-15

## 2017-06-09 MED ORDER — PRAMIPEXOLE DIHYDROCHLORIDE 0.25 MG PO TABS
0.2500 mg | ORAL_TABLET | Freq: Every day | ORAL | Status: DC
  Administered 2017-06-10 – 2017-06-15 (×5): 0.25 mg via ORAL
  Filled 2017-06-09 (×5): qty 1

## 2017-06-09 MED ORDER — TOPIRAMATE 25 MG PO TABS
25.0000 mg | ORAL_TABLET | Freq: Every day | ORAL | Status: DC
  Administered 2017-06-09 – 2017-06-13 (×5): 25 mg via ORAL
  Filled 2017-06-09 (×7): qty 1

## 2017-06-09 MED ORDER — MIRTAZAPINE 15 MG PO TABS
7.5000 mg | ORAL_TABLET | Freq: Every day | ORAL | Status: DC
  Administered 2017-06-09 – 2017-06-13 (×5): 7.5 mg via ORAL
  Filled 2017-06-09 (×6): qty 1

## 2017-06-09 MED ORDER — MONTELUKAST SODIUM 10 MG PO TABS
10.0000 mg | ORAL_TABLET | Freq: Every day | ORAL | Status: DC
  Administered 2017-06-10 – 2017-06-15 (×5): 10 mg via ORAL
  Filled 2017-06-09 (×5): qty 1

## 2017-06-09 MED ORDER — ACETAMINOPHEN 325 MG PO TABS
650.0000 mg | ORAL_TABLET | Freq: Four times a day (QID) | ORAL | Status: DC | PRN
  Administered 2017-06-10 – 2017-06-11 (×2): 650 mg via ORAL
  Filled 2017-06-09 (×2): qty 2

## 2017-06-09 MED ORDER — POTASSIUM CHLORIDE ER 20 MEQ PO TBCR: 20.0000 meq | EXTENDED_RELEASE_TABLET | Freq: Two times a day (BID) | ORAL | Status: DC

## 2017-06-09 MED ORDER — ONDANSETRON HCL 4 MG PO TABS: 4.0000 mg | ORAL_TABLET | Freq: Four times a day (QID) | ORAL | Status: DC | PRN

## 2017-06-09 MED ORDER — INSULIN ASPART 100 UNIT/ML ~~LOC~~ SOLN
0.0000 [IU] | Freq: Three times a day (TID) | SUBCUTANEOUS | Status: DC
  Administered 2017-06-10: 2 [IU] via SUBCUTANEOUS
  Administered 2017-06-10: 3 [IU] via SUBCUTANEOUS
  Administered 2017-06-11: 1 [IU] via SUBCUTANEOUS
  Administered 2017-06-11: 3 [IU] via SUBCUTANEOUS
  Administered 2017-06-12 (×2): 2 [IU] via SUBCUTANEOUS
  Administered 2017-06-13: 1 [IU] via SUBCUTANEOUS
  Administered 2017-06-13 – 2017-06-15 (×3): 2 [IU] via SUBCUTANEOUS
  Filled 2017-06-09 (×11): qty 1

## 2017-06-09 MED ORDER — PREGABALIN 50 MG PO CAPS
50.0000 mg | ORAL_CAPSULE | Freq: Two times a day (BID) | ORAL | Status: DC
  Administered 2017-06-09 – 2017-06-15 (×10): 50 mg via ORAL
  Filled 2017-06-09 (×10): qty 1

## 2017-06-09 MED ORDER — ALBUTEROL SULFATE (2.5 MG/3ML) 0.083% IN NEBU: 2.5000 mg | INHALATION_SOLUTION | RESPIRATORY_TRACT | Status: DC | PRN

## 2017-06-09 MED ORDER — POTASSIUM CHLORIDE CRYS ER 20 MEQ PO TBCR
20.0000 meq | EXTENDED_RELEASE_TABLET | Freq: Two times a day (BID) | ORAL | Status: DC
  Administered 2017-06-09 – 2017-06-13 (×9): 20 meq via ORAL
  Filled 2017-06-09 (×9): qty 1

## 2017-06-09 MED ORDER — INSULIN ASPART 100 UNIT/ML ~~LOC~~ SOLN: 0.0000 [IU] | Freq: Every day | SUBCUTANEOUS | Status: DC

## 2017-06-09 MED ORDER — ENSURE ENLIVE PO LIQD
237.0000 mL | Freq: Two times a day (BID) | ORAL | Status: DC
  Administered 2017-06-10 – 2017-06-15 (×10): 237 mL via ORAL

## 2017-06-09 MED ORDER — SENNOSIDES-DOCUSATE SODIUM 8.6-50 MG PO TABS: 1.0000 | ORAL_TABLET | Freq: Every evening | ORAL | Status: DC | PRN

## 2017-06-09 MED ORDER — LOPERAMIDE HCL 2 MG PO CAPS: 2.0000 mg | ORAL_CAPSULE | Freq: Four times a day (QID) | ORAL | Status: DC | PRN

## 2017-06-09 MED ORDER — INFLUENZA VAC SPLIT HIGH-DOSE 0.5 ML IM SUSY
0.5000 mL | PREFILLED_SYRINGE | INTRAMUSCULAR | Status: DC
  Filled 2017-06-09 (×2): qty 0.5

## 2017-06-09 MED ORDER — BISACODYL 5 MG PO TBEC: 5.0000 mg | DELAYED_RELEASE_TABLET | Freq: Every day | ORAL | Status: DC | PRN

## 2017-06-09 MED ORDER — ENOXAPARIN SODIUM 40 MG/0.4ML ~~LOC~~ SOLN
40.0000 mg | SUBCUTANEOUS | Status: AC
  Administered 2017-06-09 – 2017-06-12 (×4): 40 mg via SUBCUTANEOUS
  Filled 2017-06-09 (×4): qty 0.4

## 2017-06-09 MED ORDER — LAMOTRIGINE 100 MG PO TABS
100.0000 mg | ORAL_TABLET | Freq: Two times a day (BID) | ORAL | Status: DC
  Administered 2017-06-09 – 2017-06-15 (×10): 100 mg via ORAL
  Filled 2017-06-09 (×11): qty 1

## 2017-06-09 MED ORDER — ONDANSETRON HCL 4 MG/2ML IJ SOLN
4.0000 mg | Freq: Four times a day (QID) | INTRAMUSCULAR | Status: DC | PRN
  Administered 2017-06-09 – 2017-06-12 (×2): 4 mg via INTRAVENOUS
  Filled 2017-06-09 (×2): qty 2

## 2017-06-09 MED ORDER — PANTOPRAZOLE SODIUM 40 MG PO TBEC
40.0000 mg | DELAYED_RELEASE_TABLET | Freq: Every day | ORAL | Status: DC
  Administered 2017-06-10 – 2017-06-15 (×5): 40 mg via ORAL
  Filled 2017-06-09 (×5): qty 1

## 2017-06-09 MED ORDER — SODIUM CHLORIDE 0.9 % IV SOLN
1000.0000 mL | Freq: Once | INTRAVENOUS | Status: AC
  Administered 2017-06-09: 1000 mL via INTRAVENOUS

## 2017-06-09 MED ORDER — PREDNISONE 10 MG PO TABS
5.0000 mg | ORAL_TABLET | Freq: Every day | ORAL | Status: DC
  Administered 2017-06-10 – 2017-06-15 (×5): 5 mg via ORAL
  Filled 2017-06-09 (×5): qty 1

## 2017-06-09 NOTE — ED Provider Notes (Signed)
[facility] Emergency Department Provider Note       Time seen: ----------------------------------------- 5:10 PM on 06/09/2017 -----------------------------------------     I have reviewed the triage vital signs and the nursing notes.   HISTORY   Chief Complaint Fall    HPI Meghan [name] is a 71 y.o. female with a history of rheumatoid arthritis who presents to the ED for several falls.  Neighbors found the patient on the floor of her home and she cannot tell us how long she has been there.  She has not been contacted for the past 2 days.  Neighbors reports she was confused.  Reportedly her blood sugar was 50 and she was given oral glucose which raised her blood sugar to 85.  She has had some right hip pain.  Reportedly she fell 3 weeks ago but was never checked for that.  She cannot tell why she is following.  Past Medical History:  Diagnosis Date  . Anginal pain ([place])   . Anxiety   . Arthritis    RA  . Asthma   . Brain tumor (benign) ([place])   . CHF (congestive heart failure) ([place])   . Collagen vascular disease ([place])   . COPD (chronic obstructive pulmonary disease) ([place])   . Coronary artery disease   . DDD (degenerative disc disease)   . Depression   . Diabetes mellitus   . GERD (gastroesophageal reflux disease)   . Headache   . Heart murmur   . Heart murmur   . Hypercholesteremia   . Hypertension   . Lumbar degenerative disc disease   . Migraines   . Obese   . Obesity   . Osteopenia   . Osteoporosis   . Pneumonia   . PTSD (post-traumatic stress disorder)     Patient Active Problem List   Diagnosis Date Noted  . Syncope 05/05/2017  . Acute on chronic systolic CHF (congestive heart failure) ([place]) 04/16/2017  . Acute lower UTI 04/16/2017  . Acute respiratory failure ([place]) 04/16/2017  . Pressure injury of skin 03/22/2017  . HCAP (healthcare-associated pneumonia) 03/20/2017  . Acute on chronic respiratory failure with hypoxia ([place])  03/20/2017  . Acute respiratory failure with hypoxia ([place]) 03/20/2017  . Near syncope 02/27/2017  . Dehydration 02/27/2017  . Colitis 02/22/2017  . Seizure ([place]) 01/03/2016  . Chronic tension-type headache, intractable 12/04/2015  . 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 Degeneration of intervertebral disc of lumbar region 07/15/2015  . Seropositive rheumatoid arthritis ([place]) 07/15/2015  . Asthma with acute exacerbation 03/10/2015  . Hypokalemia 03/01/2015  . Hyponatremia 03/01/2015  . DDD (degenerative disc disease), lumbar 01/31/2015  . Arthritis, degenerative 01/31/2015  . Rheumatoid arthritis with rheumatoid factor ([place]) 01/31/2015  . HTN (hypertension) 12/24/2014  . Sepsis ([place]) 12/24/2014  . Left knee pain 12/24/2014  . GERD (gastroesophageal reflux disease) 12/24/2014  . COPD (chronic obstructive pulmonary disease) ([place]) 12/24/2014  . Depression 12/24/2014  . Anxiety 12/24/2014  . Severe bipolar disorder with psychotic features, mood-congruent ([place]) 11/16/2014  . Neurosis, posttraumatic 11/16/2014  . H/O gastric ulcer 11/16/2014  . Barton's fracture of distal radius, closed 09/19/2014  . Neuritis or radiculitis due to rupture of lumbar intervertebral disc 05/11/2014  . Cervico-occipital neuralgia 03/26/2014  . Difficulty in walking 03/26/2014  . Difficulty in walking, not elsewhere classified 03/26/2014  . Cervical spine syndrome 01/26/2014  . Cephalalgia 01/09/2014  . Disordered sleep 01/09/2014  . BP (high blood pressure) 10/19/2013  . Adiposity 10/19/2013  . Cardiac murmur  10/19/2013  . PNA (pneumonia) 10/19/2013  . Breath shortness 10/19/2013  . Chronic obstructive pulmonary disease ([place]) 10/19/2013  . Diabetes mellitus ([place]) 10/19/2013    Past Surgical History:  Procedure Laterality Date  . ABDOMINAL HYSTERECTOMY    . CERVICAL FUSION    . CHOLECYSTECTOMY    . COLONOSCOPY WITH PROPOFOL N/A 09/20/2015   Procedure: COLONOSCOPY WITH PROPOFOL;  Surgeon: Josefine Class, MD;  Location[facility] ENDOSCOPY;  Service: Endoscopy;  Laterality: N/A;  . COLONOSCOPY WITH PROPOFOL N/A 01/27/2016   Procedure: COLONOSCOPY WITH PROPOFOL;  Surgeon: Manya Silvas, MD;  Location: [facility] ENDOSCOPY;  Service: Endoscopy;  Laterality: N/A;  . FINGER ARTHROPLASTY  02/23/2012   Procedure: FINGER ARTHROPLASTY;  Surgeon: Cammie Sickle., MD;  Location: [place];  Service: Orthopedics;  Laterality: Left;  Extensor carpi radialis longus to Extensor carpi ulnaris transfer, left Metaphalangeal reconstructions of index and long fingers,  . FOOT ARTHROPLASTY     toes x2 rt foot  . HAND RECONSTRUCTION  2011   right-multiple finger joint reconst  . JOINT REPLACEMENT     bilat knee replacements    Allergies Gabapentin; Naproxen; Nsaids; and Tramadol  Social History Social History  Substance Use Topics  . Smoking status: Never Smoker  . Smokeless tobacco: Never Used  . Alcohol use No    Review of Systems Constitutional: Negative for fever. Cardiovascular: Negative for chest pain. Respiratory: Negative for shortness of breath. Gastrointestinal: Negative for abdominal pain, vomiting and diarrhea. Genitourinary: Negative for dysuria. Musculoskeletal: Positive for back pain and hip pain Skin: Positive for bruising Neurological: Negative for headaches, focal weakness or numbness.  All systems negative/normal/unremarkable except as stated in the HPI  ____________________________________________   PHYSICAL EXAM:  VITAL SIGNS: ED Triage Vitals [06/09/17 1708]  Enc Vitals Group     BP      Pulse      Resp      Temp      Temp src      SpO2      Weight 118 lb (53.5 kg)     Height 4\' 8"  (1.422 m)     Head Circumference      Peak Flow      Pain Score      Pain Loc      Pain Edu?      Excl. in [place]?     Constitutional: Alert and oriented. Well appearing and in no distress. Eyes: Conjunctivae are normal. Normal extraocular movements. ENT    Head: Normocephalic and atraumatic.   Nose: No congestion/rhinnorhea.   Mouth/Throat: Mucous membranes are moist.   Neck: No stridor. Cardiovascular: Normal rate, regular rhythm. No murmurs, rubs, or gallops. Respiratory: Normal respiratory effort without tachypnea nor retractions. Breath sounds are clear and equal bilaterally. No wheezes/rales/rhonchi. Gastrointestinal: Soft and nontender. Normal bowel sounds Musculoskeletal: Nontender with normal range of motion in extremities. No lower extremity tenderness nor edema.  Severe rheumatoid arthritis changes in her hands Neurologic:  Normal speech and language. No gross focal neurologic deficits are appreciated.  Skin: Scattered contusions are appreciated across her trunk and extremities Psychiatric: Mood and affect are normal. Speech and behavior are normal.  ____________________________________________  EKG: Interpreted by me.  Sinus tachycardia with a rate of 119 bpm, left bundle branch block, wide QRS, long QT  ____________________________________________  ED COURSE:  Pertinent labs & imaging results that were available during my care of the patient were reviewed by me and considered in my medical decision making (  see chart for details). Patient presents for frequent and prolonged falls, we will assess with labs and imaging as indicated.   Procedures ____________________________________________   LABS (pertinent positives/negatives)  Labs Reviewed  CBC WITH DIFFERENTIAL/PLATELET - Abnormal; Notable for the following:       Result Value   Lymphs Abs 0.7 (*)    All other components within normal limits  COMPREHENSIVE METABOLIC PANEL - Abnormal; Notable for the following:    Potassium 2.8 (*)    CO2 17 (*)    Glucose, Bld 100 (*)    Calcium 8.1 (*)    Total Protein 5.8 (*)    Albumin 2.6 (*)    AST 45 (*)    Total Bilirubin 1.9 (*)    Anion gap 16 (*)    All other components within normal limits  TROPONIN I -  Abnormal; Notable for the following:    Troponin I 0.07 (*)    All other components within normal limits  CK - Abnormal; Notable for the following:    Total CK 457 (*)    All other components within normal limits  URINALYSIS, COMPLETE (UACMP) WITH MICROSCOPIC    RADIOLOGY Images were viewed by me  CT head, rib x-rays, hip x-rays IMPRESSION: No evidence of rib fracture on the right.  Elevated right hemidiaphragm. Small right effusion. Right lower lung density that could be atelectasis and/or pneumonia. FINDINGS: There is no evidence of hip fracture or dislocation. There is no evidence of arthropathy or other focal bone abnormality. Extensive regional arterial calcification.  IMPRESSION: Negative. IMPRESSION: Chronic degenerative changes throughout the lumbar spine. Newly seen inferior endplate deformity at L1 that could represent a recent minor inferior endplate fracture. This was not visible on the recent CT. ____________________________________________  DIFFERENTIAL DIAGNOSIS   Dehydration, rhabdomyolysis, hip fracture, rib fracture, rheumatoid arthritis, anemia, dehydration  FINAL ASSESSMENT AND PLAN  Fall, elevated CK level, elevated troponin, mild hypokalemia, L1 compression fracture  Plan: Patient had presented for frequent falls and a prolonged fall where she was not heard from for the past 48 hours. Patients labs did reveal some abnormalities including elevated troponin, elevated CK level, likely dehydration and low potassium. Patients imaging revealed a likely new L1 compression fracture.  She does need to be evaluated by physical therapy as she cannot walk without assistance and she likely has an L1 compression fracture.  I will discuss with the hospitalist for admission.   Earleen Newport, MD   Note: This note was generated in part or whole with voice recognition software. Voice recognition is usually quite accurate but there are transcription errors  that can and very often do occur. I apologize for any typographical errors that were not detected and corrected.     Earleen Newport, MD 06/09/17 [phone]

## 2017-06-09 NOTE — H&P (Addendum)
[facility] [place] NAME: Meghan [name]    MR#:  [id number]  DATE OF BIRTH:  1945-11-14  DATE OF ADMISSION:  06/09/2017  PRIMARY CARE PHYSICIAN: Lavera Guise, MD   REQUESTING/REFERRING PHYSICIAN: Earleen Newport, MD  CHIEF COMPLAINT:   Chief Complaint  Patient presents with  . Fall   Fall today HISTORY OF PRESENT ILLNESS:  Meghan [name]  is a 71 y.o. female with a known history of hypertension, diabetes, hyperlipidemia, CHF, COPD, arthritis and obesity. The patient was sent to ED from home due to multiple falls recently. The patient is living alone at home. She was found on the floor of her home by neighbors today. She cannot tell how long she has been there. But she said she fell on the floor today and she is not sure she passed out or not. She was found tachycardia, hypokalemia with potassium of 2.8, elevated CK. Urinalysis is pending.  PAST MEDICAL HISTORY:   Past Medical History:  Diagnosis Date  . Anginal pain ([place])   . Anxiety   . Arthritis    RA  . Asthma   . Brain tumor (benign) ([place])   . CHF (congestive heart failure) ([place])   . Collagen vascular disease ([place])   . COPD (chronic obstructive pulmonary disease) ([place])   . Coronary artery disease   . DDD (degenerative disc disease)   . Depression   . Diabetes mellitus   . GERD (gastroesophageal reflux disease)   . Headache   . Heart murmur   . Heart murmur   . Hypercholesteremia   . Hypertension   . Lumbar degenerative disc disease   . Migraines   . Obese   . Obesity   . Osteopenia   . Osteoporosis   . Pneumonia   . PTSD (post-traumatic stress disorder)     PAST SURGICAL HISTORY:   Past Surgical History:  Procedure Laterality Date  . ABDOMINAL HYSTERECTOMY    . CERVICAL FUSION    . CHOLECYSTECTOMY    . COLONOSCOPY WITH PROPOFOL N/A 09/20/2015   Procedure: COLONOSCOPY WITH PROPOFOL;  Surgeon: Josefine Class, MD;  Location: [facility] ENDOSCOPY;  Service:  Endoscopy;  Laterality: N/A;  . COLONOSCOPY WITH PROPOFOL N/A 01/27/2016   Procedure: COLONOSCOPY WITH PROPOFOL;  Surgeon: Manya Silvas, MD;  Location: [facility] ENDOSCOPY;  Service: Endoscopy;  Laterality: N/A;  . FINGER ARTHROPLASTY  02/23/2012   Procedure: FINGER ARTHROPLASTY;  Surgeon: Cammie Sickle., MD;  Location: [place] [facility];  Service: Orthopedics;  Laterality: Left;  Extensor carpi radialis longus to Extensor carpi ulnaris transfer, left Metaphalangeal reconstructions of index and long fingers,  . FOOT ARTHROPLASTY     toes x2 rt foot  . HAND RECONSTRUCTION  2011   right-multiple finger joint reconst  . JOINT REPLACEMENT     bilat knee replacements    SOCIAL HISTORY:   Social History  Substance Use Topics  . Smoking status: Never Smoker  . Smokeless tobacco: Never Used  . Alcohol use No    FAMILY HISTORY:   Family History  Problem Relation Age of Onset  . Depression Sister   . Migraines Sister   . Hypertension Mother   . Arthritis/Rheumatoid Mother   . Heart attack Father   . Hypertension Father   . CAD Unknown   . Hypertension Unknown   . Diabetes Mellitus II Unknown   . Arthritis Unknown     DRUG ALLERGIES:   Allergies  Allergen Reactions  . Gabapentin Other (See Comments)    Pt states that it causes her BP to drop.   . Naproxen Hives  . Nsaids Other (See Comments)    Reaction:  Unknown   . Tramadol Itching    REVIEW OF SYSTEMS:   Review of Systems  Constitutional: Positive for malaise/fatigue. Negative for chills and fever.  HENT: Negative for sore throat.   Eyes: Negative for blurred vision and double vision.  Respiratory: Negative for cough, hemoptysis, shortness of breath, wheezing and stridor.   Cardiovascular: Negative for chest pain, palpitations, orthopnea and leg swelling.  Gastrointestinal: Negative for abdominal pain, blood in stool, diarrhea, melena, nausea and vomiting.  Genitourinary: Negative for dysuria, flank pain  and hematuria.  Musculoskeletal: Negative for back pain and joint pain.  Skin: Negative for rash.       bruises  Neurological: Positive for weakness. Negative for dizziness, sensory change, focal weakness, seizures, loss of consciousness and headaches.  Endo/Heme/Allergies: Negative for polydipsia.  Psychiatric/Behavioral: Negative for depression. The patient is not nervous/anxious.     MEDICATIONS AT HOME:   Prior to Admission medications   Medication Sig Start Date End Date Taking? Authorizing Provider  busPIRone (BUSPAR) 7.5 MG tablet Take 1 tablet by mouth 2 (two) times daily.   Yes [provider]  Cyanocobalamin (VITAMIN B 12 PO) Take 500 mg by mouth daily.   Yes [provider]  escitalopram (LEXAPRO) 10 MG tablet Take 1 tablet (10 mg total) by mouth every morning. 09/11/16  Yes Rainey Pines, MD  esomeprazole (NEXIUM) 40 MG capsule Take 40 mg by mouth daily at 12 noon.   Yes [provider]  furosemide (LASIX) 40 MG tablet Take 0.5 tablets (20 mg total) by mouth daily. 03/25/17  Yes Hillary Bow, MD  lamoTRIgine (LAMICTAL) 100 MG tablet Take 1 tablet (100 mg total) by mouth daily. Patient taking differently: Take 100 mg by mouth 2 (two) times daily.  06/02/16  Yes Rainey Pines, MD  lisinopril (PRINIVIL,ZESTRIL) 2.5 MG tablet Take 1 tablet by mouth 2 (two) times daily.   Yes [provider]  mirtazapine (REMERON) 7.5 MG tablet Take 7.5 mg by mouth at bedtime.    Yes [provider]  montelukast (SINGULAIR) 10 MG tablet Take 10 mg by mouth daily.   Yes [provider]  omeprazole (PRILOSEC) 20 MG capsule Take 20 mg by mouth at bedtime.   Yes [provider]  Potassium Chloride ER 20 MEQ TBCR Take 20 mEq by mouth 2 (two) times daily. 05/07/17  Yes Vaughan Basta, MD  pramipexole (MIRAPEX) 0.25 MG tablet Take 0.25 mg by mouth daily.    Yes [provider]  predniSONE (DELTASONE) 5 MG tablet Take 5 mg by mouth  daily with breakfast.   Yes [provider]  pregabalin (LYRICA) 50 MG capsule TAKE ONE CAPSULE BY MOUTH TWICE A DAY 06/18/15  Yes [provider]  saxagliptin HCl (ONGLYZA) 5 MG TABS tablet Take 5 mg by mouth at bedtime.   Yes [provider]  Tofacitinib Citrate 5 MG TABS Take by mouth 2 (two) times daily after a meal.   Yes [provider]  topiramate (TOPAMAX) 25 MG tablet Take 25 mg by mouth at bedtime.    Yes [provider]  budesonide (PULMICORT) 0.5 MG/2ML nebulizer solution Take 2 mLs (0.5 mg total) by nebulization 2 (two) times daily. Patient not taking: Reported on 06/02/2017 03/02/17   Max Sane, MD  carvedilol (COREG) 3.125  MG tablet Take 1 tablet (3.125 mg total) by mouth 2 (two) times daily with a meal. Patient not taking: Reported on 06/02/2017 04/19/17   Demetrios Loll, MD  diphenoxylate-atropine (LOMOTIL) 2.5-0.025 MG tablet Take 1 tablet by mouth every 6 (six) hours as needed for diarrhea or loose stools.     [provider]  feeding supplement, ENSURE ENLIVE, (ENSURE ENLIVE) LIQD Take 237 mLs by mouth 2 (two) times daily between meals. 03/24/17   Hillary Bow, MD  insulin starter kit- pen needles MISC 1 kit by Other route once. 03/09/15   Aldean Jewett, MD  ipratropium-albuterol (DUONEB) 0.5-2.5 (3) MG/3ML SOLN Take 3 mLs by nebulization 2 (two) times daily. Patient not taking: Reported on 06/02/2017 03/04/17   Loletha Grayer, MD  loperamide (IMODIUM A-D) 2 MG tablet Take 1 tablet (2 mg total) by mouth 4 (four) times daily as needed for diarrhea or loose stools. 05/10/17   Eula Listen, MD  ondansetron (ZOFRAN ODT) 4 MG disintegrating tablet Take 1 tablet (4 mg total) by mouth every 8 (eight) hours as needed for nausea or vomiting. Patient not taking: Reported on 06/02/2017 05/10/17   Eula Listen, MD  promethazine (PHENERGAN) 12.5 MG tablet Take 1 tablet (12.5 mg total) by mouth every 8 (eight) hours as  needed for nausea or vomiting. Patient not taking: Reported on 06/02/2017 05/10/17   Eula Listen, MD  promethazine (PHENERGAN) 25 MG suppository Place 1 suppository (25 mg total) rectally every 6 (six) hours as needed for nausea. Patient not taking: Reported on 06/02/2017 05/10/17 05/10/18  Eula Listen, MD      VITAL SIGNS:  Blood pressure 121/70, pulse (!) 121, temperature 98.2 F (36.8 C), temperature source Oral, resp. rate (!) 22, height 4' 8"  (1.422 m), weight 118 lb (53.5 kg), SpO2 98 %.  PHYSICAL EXAMINATION:  Physical Exam  GENERAL:  71 y.o.-year-old patient lying in the bed with no acute distress.  EYES: Pupils equal, round, reactive to light and accommodation. No scleral icterus. Extraocular muscles intact.  HEENT: Head atraumatic, normocephalic. Oropharynx and nasopharynx clear.  NECK:  Supple, no jugular venous distention. No thyroid enlargement, no tenderness.  LUNGS: Normal breath sounds bilaterally, no wheezing, rales,rhonchi or crepitation. No use of accessory muscles of respiration.  CARDIOVASCULAR: S1, S2 normal. No murmurs, rubs, or gallops.  ABDOMEN: Soft, nontender, nondistended. Bowel sounds present. No organomegaly or mass.  EXTREMITIES: No pedal edema, cyanosis, or clubbing.  NEUROLOGIC: Cranial nerves II through XII are intact. Muscle strength 3-4/5 in all extremities. Sensation intact. Gait not checked.  PSYCHIATRIC: The patient is alert and oriented x 3.  SKIN: No obvious rash, lesion, or ulcer. But has bruises on both arms and hands.  LABORATORY PANEL:   CBC  Recent Labs Lab 06/09/17 1712  WBC 5.4  HGB 12.8  HCT 38.3  PLT 284   ------------------------------------------------------------------------------------------------------------------  Chemistries   Recent Labs Lab 06/09/17 1712  NA 141  K 2.8*  CL 108  CO2 17*  GLUCOSE 100*  BUN 11  CREATININE 0.53  CALCIUM 8.1*  AST 45*  ALT 25  ALKPHOS 77  BILITOT 1.9*    ------------------------------------------------------------------------------------------------------------------  Cardiac Enzymes  Recent Labs Lab 06/09/17 1712  TROPONINI 0.07*   ------------------------------------------------------------------------------------------------------------------  RADIOLOGY:  Dg Ribs Unilateral W/chest Right  Result Date: 06/09/2017 CLINICAL DATA:  Patient found on the floor. Confusion. Right-sided pain. EXAM: RIGHT RIBS AND CHEST - 3+ VIEW COMPARISON:  05/10/2017 in previous FINDINGS: Heart size is normal. Mediastinal shadows are normal. Left chest  is clear. There is chronic elevation of the right hemidiaphragm with some right effusion in volume loss in the right lower lung. Right lower lung pneumonia not excluded. Rib films on the right do not show a fracture. IMPRESSION: No evidence of rib fracture on the right. Elevated right hemidiaphragm. Small right effusion. Right lower lung density that could be atelectasis and/or pneumonia. Electronically Signed   By: Nelson Chimes M.D.   On: 06/09/2017 18:00   Dg Lumbar Spine 2-3 Views  Result Date: 06/09/2017 CLINICAL DATA:  Found on the floor.  Right-sided pain. EXAM: LUMBAR SPINE - 2-3 VIEW COMPARISON:  05/10/2017 FINDINGS: Curvature convex to the left. 8 mm anterolisthesis L4-5 because of facet arthropathy. Disc space narrowing, most pronounced at L2-3. Newly seen inferior endplate deformity at L1 which could represent a recent fracture. This was not visible on the CT of 05/10/2017. Lower thoracic degenerative changes as seen previously. IMPRESSION: Chronic degenerative changes throughout the lumbar spine. Newly seen inferior endplate deformity at L1 that could represent a recent minor inferior endplate fracture. This was not visible on the recent CT. Electronically Signed   By: Nelson Chimes M.D.   On: 06/09/2017 18:02   Ct Head Wo Contrast  Result Date: 06/09/2017 CLINICAL DATA:  Altered mental status.  EXAM: CT HEAD WITHOUT CONTRAST TECHNIQUE: Contiguous axial images were obtained from the base of the skull through the vertex without intravenous contrast. COMPARISON:  CT head dated March 02, 2017. FINDINGS: Brain: No evidence of acute infarction, hemorrhage, hydrocephalus, extra-axial collection or mass lesion/mass effect. Stable age related cerebral atrophy and chronic microvascular ischemic white matter disease. Unchanged right cerebellar encephalomalacia. Vascular: Atherosclerotic vascular calcification of the carotid siphons. No hyperdense vessel. Skull: Prior right suboccipital craniotomy. Negative for fracture or focal lesion. Sinuses/Orbits: Partial opacification of the left mastoid air cells, improved when compared to prior study. No acute orbital finding. Other: None. IMPRESSION: 1. No acute intracranial abnormality. Stable atrophy and chronic microvascular ischemic white matter disease. 2. Partial opacification of the left mastoid air cells, improved when compared to prior study. Electronically Signed   By: Titus Dubin M.D.   On: 06/09/2017 17:40   Dg Hand 2 View Left  Result Date: 06/09/2017 CLINICAL DATA:  71 year old with current history of rheumatoid arthritis to has had multiple falls recently and was found on the floor of her home earlier today. Left hand pain. Initial encounter. EXAM: LEFT HAND - 2 VIEW COMPARISON:  No prior hand x-rays.  Left wrist x-rays 09/19/2014. FINDINGS: No evidence of acute fracture. Severe osseous demineralization. Severe deformities at all of the MCP joints related to the rheumatoid arthritis. Prior ORIF of a distal radius fracture with healing. IMPRESSION: 1. No acute osseous abnormality. 2. Deformities involving the MCP joints related to chronic rheumatoid arthritis. 3. Osseous demineralization. Electronically Signed   By: Evangeline Dakin M.D.   On: 06/09/2017 19:18   Dg Hip Unilat W Or Wo Pelvis 2-3 Views Right  Result Date: 06/09/2017 CLINICAL DATA:   Found on the floor.  Right-sided pain. EXAM: DG HIP (WITH OR WITHOUT PELVIS) 2-3V RIGHT COMPARISON:  None. FINDINGS: There is no evidence of hip fracture or dislocation. There is no evidence of arthropathy or other focal bone abnormality. Extensive regional arterial calcification. IMPRESSION: Negative. Electronically Signed   By: Nelson Chimes M.D.   On: 06/09/2017 18:04      IMPRESSION AND PLAN:  The patient will be admitted to medical floor.  Hypokalemia Give potassium supplement and follow-up BMP and  magnesium level.  Tachycardia. Telemetry monitor. Elevated troponin. Due to above. Start plavix. Follow-up troponin level and a cardiology consult. The patient has an allergy to aspirin.  Mild rhabdomyolysis. IV fluid support and follow-up CK.  Elevated bilirubin. Unclear etiology. Recent abdominal CT was unremarkable. Follow-up bilirubin level.  History of chronic systolic CHF. Continue Lasix and lisinopril. Stable.  COPD. Stable NEB when necessary.  Diabetes. Start sliding scale.  Frequent falls. Fall precaution. Generalized weakness. PT evaluation.  All the records are reviewed and case discussed with ED provider. Management plans discussed with the patient, family and they are in agreement.  CODE STATUS: DO NOT RESUSCITATE.  TOTAL TIME TAKING CARE OF THIS PATIENT: 53 minutes.    Demetrios Loll M.D on 06/09/2017 at 8:46 PM  Between 7am to 6pm - Pager - [phone]  After 6pm go to [email or web address] - password [email or web address] [facility]  [facility]  Office  [phone]  CC: Primary care physician; Lavera Guise, MD   Note: This dictation was prepared with Dragon dictation along with smaller phrase technology. Any transcriptional errors that result from this process are unin

## 2017-06-09 NOTE — ED Notes (Signed)
Admitting MD at bedside. Pt given another Coke and meal tray. PT continues to reports toe pain. MD aware.

## 2017-06-09 NOTE — ED Triage Notes (Signed)
Pt to ED via EMS from home. Neighbors found pt on the floor. PT has not been contacted for the past two days. Neighbors report pt was confused from baseline. cbg 50 ems gave oral glucose CBG rose to 85. PT reports pain in right back and flank. Fall three weeks ago that was never checked either.

## 2017-06-10 ENCOUNTER — Inpatient Hospital Stay: Payer: Medicare Other

## 2017-06-10 LAB — GLUCOSE, CAPILLARY
GLUCOSE-CAPILLARY: 96 mg/dL (ref 65–99)
GLUCOSE-CAPILLARY: 250 mg/dL — AB (ref 65–99)
Glucose-Capillary: 196 mg/dL — ABNORMAL HIGH (ref 65–99)
Glucose-Capillary: 131 mg/dL — ABNORMAL HIGH (ref 65–99)

## 2017-06-10 LAB — BASIC METABOLIC PANEL
Anion gap: 4 — ABNORMAL LOW (ref 5–15)
BUN: 9 mg/dL (ref 6–20)
CALCIUM: 7.9 mg/dL — AB (ref 8.9–10.3)
CO2: 24 mmol/L (ref 22–32)
CREATININE: 0.41 mg/dL — AB (ref 0.44–1.00)
Chloride: 111 mmol/L (ref 101–111)
GFR calc Af Amer: >60 mL/min (ref >60)
GFR calc non Af Amer: >60 mL/min (ref >60)
GLUCOSE: 94 mg/dL (ref 65–99)
Potassium: 3.7 mmol/L (ref 3.5–5.1)
Sodium: 139 mmol/L (ref 135–145)

## 2017-06-10 LAB — TROPONIN I
TROPONIN I: 0.06 ng/mL — AB (ref <0.03)
TROPONIN I: 0.08 ng/mL — AB (ref <0.03)
Troponin I: 0.07 ng/mL (ref <0.03)
Troponin I: 0.09 ng/mL (ref <0.03)
Troponin I: 0.11 ng/mL (ref <0.03)

## 2017-06-10 LAB — MRSA PCR SCREENING: MRSA by PCR: POSITIVE — AB

## 2017-06-10 LAB — CK: Total CK: 186 U/L (ref 38–234)

## 2017-06-10 LAB — MAGNESIUM: Magnesium: 1.7 mg/dL (ref 1.7–2.4)

## 2017-06-10 MED ORDER — CHLORHEXIDINE GLUCONATE CLOTH 2 % EX PADS
6.0000 | MEDICATED_PAD | Freq: Every day | CUTANEOUS | Status: AC
  Administered 2017-06-10 – 2017-06-14 (×4): 6 via TOPICAL

## 2017-06-10 MED ORDER — MUPIROCIN 2 % EX OINT
1.0000 "application " | TOPICAL_OINTMENT | Freq: Two times a day (BID) | CUTANEOUS | Status: AC
  Administered 2017-06-10 – 2017-06-14 (×9): 1 via NASAL
  Filled 2017-06-10: qty 22

## 2017-06-10 NOTE — Consult Note (Signed)
Radiographs reviewed, acute L1 compression fracture. Recommend MRI to see if adjacent levels involved. Plavix stopped in case surgery needed, would need to wait until Monday.

## 2017-06-10 NOTE — Consult Note (Signed)
Meghan [name]  Patient ID: Meghan [name], MRN: [id number], DOB/AGE: [birth year] 71 y.o. Admit date: 06/09/2017   Date of Consult: 06/10/2017 Primary Physician: Lavera Guise, MD Primary Cardiologist: [facility]  Chief Complaint:  Chief Complaint  Patient presents with  . Fall   Reason for Consult: syncope  HPI: 71 y.o. female with the known left bundle branch block diabetes with complication essential hypertension mixed hyperlipidemia and previous history of multiple episodes of the syncope of unknown etiology. The patient has had a full workup of this assessment and the patient had a recently new and normal stress test without evidence of ischemia and echocardiogram showing normal LV function without evidence of significant valvular heart disease and Holter monitor as well as a loop monitor showing no issue. After taking the loop monitor off the patient ends up having another syncopal episode of which she cannot recall. The patient does have concerns and issues of possible dehydration hypokalemia and hypomagnesemia as a cause at this time. She has been on appropriate medication management for hypertension control diabetes and does remain on Lasix for diastolic dysfunction heart failure. These also may be causes of her symptoms and syncope. There is no evidence of myocardial infarction at this time despite the troponin level is 0.11. Currently she feels well with telemetry showing normal sinus rhythm  Past Medical History:  Diagnosis Date  . Anginal pain ([place])   . Anxiety   . Arthritis    RA  . Asthma   . Brain tumor (benign) ([place])   . CHF (congestive heart failure) ([place])   . Collagen vascular disease ([place])   . COPD (chronic obstructive pulmonary disease) ([place])   . Coronary artery disease   . DDD (degenerative disc disease)   . Depression   . Diabetes mellitus   . GERD (gastroesophageal reflux disease)   . Headache   . Heart murmur   . Heart murmur   .  Hypercholesteremia   . Hypertension   . Lumbar degenerative disc disease   . Migraines   . Obese   . Obesity   . Osteopenia   . Osteoporosis   . Pneumonia   . PTSD (post-traumatic stress disorder)       Surgical History:  Past Surgical History:  Procedure Laterality Date  . ABDOMINAL HYSTERECTOMY    . CERVICAL FUSION    . CHOLECYSTECTOMY    . COLONOSCOPY WITH PROPOFOL N/A 09/20/2015   Procedure: COLONOSCOPY WITH PROPOFOL;  Surgeon: Josefine Class, MD;  Location: [facility] ENDOSCOPY;  Service: Endoscopy;  Laterality: N/A;  . COLONOSCOPY WITH PROPOFOL N/A 01/27/2016   Procedure: COLONOSCOPY WITH PROPOFOL;  Surgeon: Manya Silvas, MD;  Location: [facility] ENDOSCOPY;  Service: Endoscopy;  Laterality: N/A;  . FINGER ARTHROPLASTY  02/23/2012   Procedure: FINGER ARTHROPLASTY;  Surgeon: Cammie Sickle., MD;  Location: [place];  Service: Orthopedics;  Laterality: Left;  Extensor carpi radialis longus to Extensor carpi ulnaris transfer, left Metaphalangeal reconstructions of index and long fingers,  . FOOT ARTHROPLASTY     toes x2 rt foot  . HAND RECONSTRUCTION  2011   right-multiple finger joint reconst  . JOINT REPLACEMENT     bilat knee replacements     Home Meds: Prior to Admission medications   Medication Sig Start Date End Date Taking? Authorizing Provider  busPIRone (BUSPAR) 7.5 MG tablet Take 1 tablet by mouth 2 (two) times daily.   Yes [provider]  Cyanocobalamin (VITAMIN B 12  PO) Take 500 mg by mouth daily.   Yes [provider]  escitalopram (LEXAPRO) 10 MG tablet Take 1 tablet (10 mg total) by mouth every morning. 09/11/16  Yes Rainey Pines, MD  esomeprazole (NEXIUM) 40 MG capsule Take 40 mg by mouth daily at 12 noon.   Yes [provider]  furosemide (LASIX) 40 MG tablet Take 0.5 tablets (20 mg total) by mouth daily. 03/25/17  Yes Hillary Bow, MD  lamoTRIgine (LAMICTAL) 100 MG tablet Take 1 tablet (100 mg total) by mouth  daily. Patient taking differently: Take 100 mg by mouth 2 (two) times daily.  06/02/16  Yes Rainey Pines, MD  lisinopril (PRINIVIL,ZESTRIL) 2.5 MG tablet Take 1 tablet by mouth 2 (two) times daily.   Yes [provider]  mirtazapine (REMERON) 7.5 MG tablet Take 7.5 mg by mouth at bedtime.    Yes [provider]  montelukast (SINGULAIR) 10 MG tablet Take 10 mg by mouth daily.   Yes [provider]  omeprazole (PRILOSEC) 20 MG capsule Take 20 mg by mouth at bedtime.   Yes [provider]  Potassium Chloride ER 20 MEQ TBCR Take 20 mEq by mouth 2 (two) times daily. 05/07/17  Yes Vaughan Basta, MD  pramipexole (MIRAPEX) 0.25 MG tablet Take 0.25 mg by mouth daily.    Yes [provider]  predniSONE (DELTASONE) 5 MG tablet Take 5 mg by mouth daily with breakfast.   Yes [provider]  pregabalin (LYRICA) 50 MG capsule TAKE ONE CAPSULE BY MOUTH TWICE A DAY 06/18/15  Yes [provider]  saxagliptin HCl (ONGLYZA) 5 MG TABS tablet Take 5 mg by mouth at bedtime.   Yes [provider]  Tofacitinib Citrate 5 MG TABS Take by mouth 2 (two) times daily after a meal.   Yes [provider]  topiramate (TOPAMAX) 25 MG tablet Take 25 mg by mouth at bedtime.    Yes [provider]  budesonide (PULMICORT) 0.5 MG/2ML nebulizer solution Take 2 mLs (0.5 mg total) by nebulization 2 (two) times daily. Patient not taking: Reported on 06/02/2017 03/02/17   Max Sane, MD  carvedilol (COREG) 3.125 MG tablet Take 1 tablet (3.125 mg total) by mouth 2 (two) times daily with a meal. Patient not taking: Reported on 06/02/2017 04/19/17   Demetrios Loll, MD  diphenoxylate-atropine (LOMOTIL) 2.5-0.025 MG tablet Take 1 tablet by mouth every 6 (six) hours as needed for diarrhea or loose stools.     [provider]  feeding supplement, ENSURE ENLIVE, (ENSURE ENLIVE) LIQD Take 237 mLs by mouth 2 (two) times daily between meals. 03/24/17    Hillary Bow, MD  insulin starter kit- pen needles MISC 1 kit by Other route once. 03/09/15   Aldean Jewett, MD  ipratropium-albuterol (DUONEB) 0.5-2.5 (3) MG/3ML SOLN Take 3 mLs by nebulization 2 (two) times daily. Patient not taking: Reported on 06/02/2017 03/04/17   Loletha Grayer, MD  loperamide (IMODIUM A-D) 2 MG tablet Take 1 tablet (2 mg total) by mouth 4 (four) times daily as needed for diarrhea or loose stools. 05/10/17   Eula Listen, MD  ondansetron (ZOFRAN ODT) 4 MG disintegrating tablet Take 1 tablet (4 mg total) by mouth every 8 (eight) hours as needed for nausea or vomiting. Patient not taking: Reported on 06/02/2017 05/10/17   Eula Listen, MD  promethazine (PHENERGAN) 12.5 MG tablet Take 1 tablet (12.5 mg total) by mouth every 8 (eight) hours as needed for nausea or vomiting. Patient not taking: Reported on  06/02/2017 05/10/17   Eula Listen, MD  promethazine (PHENERGAN) 25 MG suppository Place 1 suppository (25 mg total) rectally every 6 (six) hours as needed for nausea. Patient not taking: Reported on 06/02/2017 05/10/17 05/10/18  Eula Listen, MD    Inpatient Medications:  . busPIRone  7.5 mg Oral BID  . Chlorhexidine Gluconate Cloth  6 each Topical Q0600  . clopidogrel  75 mg Oral Daily  . enoxaparin (LOVENOX) injection  40 mg Subcutaneous Q24H  . feeding supplement (ENSURE ENLIVE)  237 mL Oral BID BM  . furosemide  20 mg Oral Daily  . Influenza vac split quadrivalent PF  0.5 mL Intramuscular Tomorrow-1000  . insulin aspart  0-5 Units Subcutaneous QHS  . insulin aspart  0-9 Units Subcutaneous TID WC  . lamoTRIgine  100 mg Oral BID  . lisinopril  2.5 mg Oral BID  . mirtazapine  7.5 mg Oral QHS  . montelukast  10 mg Oral Daily  . mupirocin ointment  1 application Nasal BID  . pantoprazole  40 mg Oral Daily  . potassium chloride  20 mEq Oral BID  . pramipexole  0.25 mg Oral Daily  . predniSONE  5 mg Oral Q breakfast  . pregabalin   50 mg Oral BID  . topiramate  25 mg Oral QHS     Allergies:  Allergies  Allergen Reactions  . Gabapentin Other (See Comments)    Pt states that it causes her BP to drop.   . Naproxen Hives  . Nsaids Other (See Comments)    Reaction:  Unknown     Social History   Social History  . Marital status: Divorced    Spouse name: N/A  . Number of children: N/A  . Years of education: N/A   Occupational History  . retired    Social History Main Topics  . Smoking status: Never Smoker  . Smokeless tobacco: Never Used  . Alcohol use No  . Drug use: No  . Sexual activity: No   Other Topics Concern  . Not on file   Social History Narrative  . No narrative on file     Family History  Problem Relation Age of Onset  . Depression Sister   . Migraines Sister   . Hypertension Mother   . Arthritis/Rheumatoid Mother   . Heart attack Father   . Hypertension Father   . CAD Unknown   . Hypertension Unknown   . Diabetes Mellitus II Unknown   . Arthritis Unknown      Review of Systems Positive for syncope Negative for: General:  chills, fever, night sweats or weight changes.  Cardiovascular: PND orthopnea positive for syncope dizziness  Dermatological skin lesions rashes Respiratory: Cough congestion Urologic: Frequent urination urination at night and hematuria Abdominal: negative for nausea, vomiting, diarrhea, bright red blood per rectum, melena, or hematemesis Neurologic: negative for visual changes, and/or hearing changes  All other systems reviewed and are otherwise negative except as noted above.  Labs:  Recent Labs  06/09/17 1712 06/09/17 2336 06/10/17 0537  CKTOTAL 457*  --  186  TROPONINI 0.07* 0.09* 0.11*   Lab Results  Component Value Date   WBC 5.4 06/09/2017   HGB 12.8 06/09/2017   HCT 38.3 06/09/2017   MCV 87.0 06/09/2017   PLT 284 06/09/2017    Recent Labs Lab 06/09/17 1712 06/10/17 0537  NA 141 139  K 2.8* 3.7  CL 108 111  CO2 17* 24  BUN  11 9  CREATININE 0.53  0.41*  CALCIUM 8.1* 7.9*  PROT 5.8*  --   BILITOT 1.9*  --   ALKPHOS 77  --   ALT 25  --   AST 45*  --   GLUCOSE 100* 94   Lab Results  Component Value Date   CHOL 148 08/01/2012   HDL 47 08/01/2012   LDLCALC 80 08/01/2012   TRIG 103 08/01/2012   No results found for: DDIMER  Radiology/Studies:  Dg Ribs Unilateral W/chest Right  Result Date: 06/09/2017 CLINICAL DATA:  Patient found on the floor. Confusion. Right-sided pain. EXAM: RIGHT RIBS AND CHEST - 3+ VIEW COMPARISON:  05/10/2017 in previous FINDINGS: Heart size is normal. Mediastinal shadows are normal. Left chest is clear. There is chronic elevation of the right hemidiaphragm with some right effusion in volume loss in the right lower lung. Right lower lung pneumonia not excluded. Rib films on the right do not show a fracture. IMPRESSION: No evidence of rib fracture on the right. Elevated right hemidiaphragm. Small right effusion. Right lower lung density that could be atelectasis and/or pneumonia. Electronically Signed   By: Nelson Chimes M.D.   On: 06/09/2017 18:00   Dg Lumbar Spine 2-3 Views  Result Date: 06/09/2017 CLINICAL DATA:  Found on the floor.  Right-sided pain. EXAM: LUMBAR SPINE - 2-3 VIEW COMPARISON:  05/10/2017 FINDINGS: Curvature convex to the left. 8 mm anterolisthesis L4-5 because of facet arthropathy. Disc space narrowing, most pronounced at L2-3. Newly seen inferior endplate deformity at L1 which could represent a recent fracture. This was not visible on the CT of 05/10/2017. Lower thoracic degenerative changes as seen previously. IMPRESSION: Chronic degenerative changes throughout the lumbar spine. Newly seen inferior endplate deformity at L1 that could represent a recent minor inferior endplate fracture. This was not visible on the recent CT. Electronically Signed   By: Nelson Chimes M.D.   On: 06/09/2017 18:02   Ct Head Wo Contrast  Result Date: 06/09/2017 CLINICAL DATA:  Altered mental  status. EXAM: CT HEAD WITHOUT CONTRAST TECHNIQUE: Contiguous axial images were obtained from the base of the skull through the vertex without intravenous contrast. COMPARISON:  CT head dated March 02, 2017. FINDINGS: Brain: No evidence of acute infarction, hemorrhage, hydrocephalus, extra-axial collection or mass lesion/mass effect. Stable age related cerebral atrophy and chronic microvascular ischemic white matter disease. Unchanged right cerebellar encephalomalacia. Vascular: Atherosclerotic vascular calcification of the carotid siphons. No hyperdense vessel. Skull: Prior right suboccipital craniotomy. Negative for fracture or focal lesion. Sinuses/Orbits: Partial opacification of the left mastoid air cells, improved when compared to prior study. No acute orbital finding. Other: None. IMPRESSION: 1. No acute intracranial abnormality. Stable atrophy and chronic microvascular ischemic white matter disease. 2. Partial opacification of the left mastoid air cells, improved when compared to prior study. Electronically Signed   By: Titus Dubin M.D.   On: 06/09/2017 17:40   Dg Hand 2 View Left  Result Date: 06/09/2017 CLINICAL DATA:  72 year old with current history of rheumatoid arthritis to has had multiple falls recently and was found on the floor of her home earlier today. Left hand pain. Initial encounter. EXAM: LEFT HAND - 2 VIEW COMPARISON:  No prior hand x-rays.  Left wrist x-rays 09/19/2014. FINDINGS: No evidence of acute fracture. Severe osseous demineralization. Severe deformities at all of the MCP joints related to the rheumatoid arthritis. Prior ORIF of a distal radius fracture with healing. IMPRESSION: 1. No acute osseous abnormality. 2. Deformities involving the MCP joints related to chronic rheumatoid arthritis. 3. Osseous  demineralization. Electronically Signed   By: Evangeline Dakin M.D.   On: 06/09/2017 19:18   Dg Hip Unilat W Or Wo Pelvis 2-3 Views Right  Result Date: 06/09/2017 CLINICAL  DATA:  Found on the floor.  Right-sided pain. EXAM: DG HIP (WITH OR WITHOUT PELVIS) 2-3V RIGHT COMPARISON:  None. FINDINGS: There is no evidence of hip fracture or dislocation. There is no evidence of arthropathy or other focal bone abnormality. Extensive regional arterial calcification. IMPRESSION: Negative. Electronically Signed   By: Nelson Chimes M.D.   On: 06/09/2017 18:04    EKG: Normal sinus rhythm with left bundle-branch block  Weights: Filed Weights   06/09/17 1708 06/09/17 2155  Weight: 53.5 kg (118 lb) 51.6 kg (113 lb 12.8 oz)     Physical Exam: Blood pressure (!) 90/53, pulse (!) 113, temperature 98.4 F (36.9 C), temperature source Oral, resp. rate 18, height 4' 8"  (1.422 m), weight 51.6 kg (113 lb 12.8 oz), SpO2 93 %. Body mass index is 25.51 kg/m. General: Well developed, well nourished, in no acute distress. Head eyes ears nose throat: Normocephalic, atraumatic, sclera non-icteric, no xanthomas, nares are without discharge. No apparent thyromegaly and/or mass  Lungs: Normal respiratory effort.  no wheezes, no rales, no rhonchi.  Heart: RRR with normal S1 S2. no murmur gallop, no rub, PMI is normal size and placement, carotid upstroke normal without bruit, jugular venous pressure is normal Abdomen: Soft, non-tender, non-distended with normoactive bowel sounds. No hepatomegaly. No rebound/guarding. No obvious abdominal masses. Abdominal aorta is normal size without bruit Extremities: No edema. no cyanosis, no clubbing, no ulcers  Peripheral : 2+ bilateral upper extremity pulses, 2+ bilateral femoral pulses, 2+ bilateral dorsal pedal pulse Neuro: Alert and oriented. No facial asymmetry. No focal deficit. Moves all extremities spontaneously. Musculoskeletal: Normal muscle tone without kyphosis Psych:  Responds to questions appropriately with a normal affect.    Assessment: 71 year old female with essential hypertension makes hyperlipidemia chronic diastolic dysfunction heart  failure left bundle-branch block and recurrent syncope of unknown etiology with negative cardiovascular workup in the recent past concerning for other rhythm disturbances  Plan: . Watching telemetry 2. Begin ambulation and follow for telemetry for causes of syncope 3. No change in current medical regimen of the ACE inhibitor for hypertension control 4. Further consideration of link monitor with or long-term monitor to assess for etiology of syncope due to significant recurrence and negative cardiovascular workup 5. Without change 6. Further consideration of discontinuation of furosemide or supplementation of potassium or magnesium which may contribute to above  Signed, Corey Skains M.D. [facility] 06/10/2017, 8:47 AM

## 2017-06-10 NOTE — Evaluation (Signed)
Physical Therapy Evaluation Patient Details Name: Meghan [name] MRN: [id number] DOB: 1946-04-26 Today's Date: 06/10/2017   History of Present Illness  Pt was admitted to hospital with dx of hypokalemia following a fall.  PMH includes Htn, DM, CHF, COPD, arthritis and obesity.  Clinical Impression  Pt is a 71 year old female with a multiple fall hx.  She requires mod to max assist for bed mobiilty and min assist for STS transfer due to pt stated LBP.  She was able to ambulate 30 ft in her room with a RW and CGA from PT with min VC's for upright posture and UE placement.  Pt repeated that she is experiencing LBP throughout evaluation and that is limiting her mobility.  Pt uses a RW primarily for mobility at home and remains mostly in her apartment.  Pt will continue to benefit from skilled PT with focus on strengthening, functional mobility, tolerance to activity and balance.    Follow Up Recommendations SNF    Equipment Recommendations       Recommendations for Other Services       Precautions / Restrictions Precautions Precautions: Fall Restrictions Other Position/Activity Restrictions: PT entered room to find all four bedrails up on pt's bed.  Consulted RN concerning this and pt bed alarm which PT did not turn on following evaluation.      Mobility  Bed Mobility Overal bed mobility: Needs Assistance Bed Mobility: Rolling;Sidelying to Sit;Supine to Sit;Sit to Supine Rolling: Mod assist Sidelying to sit: Mod assist Supine to sit: Mod assist Sit to supine: Mod assist   General bed mobility comments: Pt requires B UE assist with use of bed rail and PT UE as well as use of draw sheet to assist pt to position herself at EOB.  Pt performed sit to supine with head of bed elevated and demosntrated ability to perform bridge for limited repositioning.  Pt stated that her low back experienced pain throughout all transfers.  Transfers Overall transfer level: Needs assistance Equipment used:  Rolling walker (2 wheeled) Transfers: Sit to/from Stand Sit to Stand: Min guard         General transfer comment: Pt required min VC's for UE placement and upright posture as well as close CGA due to LBP.  Ambulation/Gait Ambulation/Gait assistance: Min guard Ambulation Distance (Feet): 30 Feet Assistive device: Rolling walker (2 wheeled) Gait Pattern/deviations: Shuffle;Trunk flexed   Gait velocity interpretation: Below normal speed for age/gender General Gait Details: Pt maintains a flexed posture with UE on RW during gait, decreased step length and foot clearance, indicating fall risk.  Pt demosntrates knowledge of use of RW and is able to maintain proximity to body with min VC's.  Stairs            Wheelchair Mobility    Modified Rankin (Stroke Patients Only)       Balance Overall balance assessment: Needs assistance;History of Falls Sitting-balance support: Bilateral upper extremity supported     Postural control:  (Forward flexed posture with bilateral UE support.) Standing balance support: Bilateral upper extremity supported                                 Pertinent Vitals/Pain Pain Assessment: Faces Faces Pain Scale: Hurts even more Pain Location: Low back (Pt reports that LBP started after her last fall.) Pain Intervention(s): Limited activity within patient's tolerance;Monitored during session    Home Living Family/patient expects to be discharged to::  Skilled nursing facility Living Arrangements: Spouse/significant other               Additional Comments: Pt states that she wishes to return to her apartment but that she likes the rehab facility which she went to last time she had a fall.    Prior Function Level of Independence: Needs assistance   Gait / Transfers Assistance Needed: Pt requires close CGA due to repeatedly expressing that she is not able to move due to severe LBP.  Pt requires SBA during ambulation with RW.  ADL's  / Homemaking Assistance Needed: Pt states that she requires someone to bring groceries into her home and that she is interested in someone coming in to help her with bathing, cooking, etc.        Hand Dominance        Extremity/Trunk Assessment   Upper Extremity Assessment Upper Extremity Assessment: Generalized weakness    Lower Extremity Assessment Lower Extremity Assessment: Generalized weakness    Cervical / Trunk Assessment Cervical / Trunk Assessment: Kyphotic  Communication   Communication: No difficulties  Cognition Arousal/Alertness: Lethargic Behavior During Therapy: Restless Overall Cognitive Status: Difficult to assess                                 General Comments: Pt demonstrated being A&O to name, birthdate and location but was unable to clearly describe plans for living arrangements and husband's living arrangements as well.      General Comments      Exercises General Exercises - Lower Extremity Ankle Circles/Pumps: AROM;10 reps;Supine;Both Heel Slides: AROM;Both;10 reps;Supine Hip ABduction/ADduction: AROM;Both;10 reps   Assessment/Plan    PT Assessment Patient needs continued PT services  PT Problem List Decreased strength;Decreased activity tolerance;Decreased balance;Decreased mobility;Pain       PT Treatment Interventions DME instruction;Gait training;Stair training;Functional mobility training;Therapeutic activities;Therapeutic exercise;Balance training;Patient/family education    PT Goals (Current goals can be found in the Care Plan section)  Acute Rehab PT Goals Patient Stated Goal: To be able to eventually return home with her husband. PT Goal Formulation: With patient Time For Goal Achievement: 06/24/17 Potential to Achieve Goals: Good    Frequency Min 2X/week   Barriers to discharge        Co-evaluation               AM-PAC PT "6 Clicks" Daily Activity  Outcome Measure Difficulty turning over in bed  (including adjusting bedclothes, sheets and blankets)?: A Lot Difficulty moving from lying on back to sitting on the side of the bed? : A Lot Difficulty sitting down on and standing up from a chair with arms (e.g., wheelchair, bedside commode, etc,.)?: A Lot Help needed moving to and from a bed to chair (including a wheelchair)?: A Lot Help needed walking in hospital room?: A Little Help needed climbing 3-5 steps with a railing? : A Lot 6 Click Score: 13    End of Session Equipment Utilized During Treatment: Gait belt Activity Tolerance: Patient limited by pain Patient left: in bed;with call bell/phone within reach;with nursing/sitter in room;with restraints reapplied (Pt discussed resetting bed alarm with nurse who stated that he would set when he re-entered pt room.) Nurse Communication: Precautions PT Visit Diagnosis: Unsteadiness on feet (R26.81);Repeated falls (R29.6);Muscle weakness (generalized) (M62.81);History of falling (Z91.81);Difficulty in walking, not elsewhere classified (R26.2);Pain Pain - part of body:  (Low back)    Time: 1110-1135 PT Time Calculation (min) (  ACUTE ONLY): 25 min   Charges:   PT Evaluation $PT Eval Moderate Complexity: 1 Mod     PT G Codes:   PT G-Codes **NOT FOR INPATIENT CLASS** Functional Assessment Tool Used: AM-PAC 6 Clicks Basic Mobility Functional Limitation: Mobility: Walking and moving around;Changing and maintaining body position;Self care;Carrying, moving and handling objects Mobility: Walking and Moving Around Current Status [phone]): At least 60 percent but less than 80 percent impaired, limited or restricted Mobility: Walking and Moving Around Goal Status [phone]): At least 20 percent but less than 40 percent impaired, limited or restricted    Roxanne Gates, PT, DPT  Roxanne Gates 06/10/2017, 12:10 PM

## 2017-06-10 NOTE — Progress Notes (Signed)
[place] at [place] NAME: Meghan [name]    MR#:  [id number]  DATE OF BIRTH:  1946-06-05  SUBJECTIVE:   C/o right hip pain  REVIEW OF SYSTEMS:    Review of Systems  Constitutional: Negative for fever, chills weight loss HENT: Negative for ear pain, nosebleeds, congestion, facial swelling, rhinorrhea, neck pain, neck stiffness and ear discharge.   Respiratory: Negative for cough, shortness of breath, wheezing  Cardiovascular: Negative for chest pain, palpitations and leg swelling.  Gastrointestinal: Negative for heartburn, abdominal pain, vomiting, diarrhea or consitpation Genitourinary: Negative for dysuria, urgency, frequency, hematuria Musculoskeletal: Right hip pain  Neurological: Negative for dizziness, seizures, syncope, focal weakness,  numbness and headaches.  Hematological: Bruises easily  Psychiatric/Behavioral: Negative for hallucinations, confusion, dysphoric mood    Tolerating Diet: yes      DRUG ALLERGIES:   Allergies  Allergen Reactions  . Gabapentin Other (See Comments)    Pt states that it causes her BP to drop.   . Naproxen Hives  . Nsaids Other (See Comments)    Reaction:  Unknown     VITALS:  Blood pressure 105/68, pulse (!) 120, temperature 98.4 F (36.9 C), temperature source Oral, resp. rate 18, height 4\' 8"  (1.422 m), weight 51.6 kg (113 lb 12.8 oz), SpO2 93 %.  PHYSICAL EXAMINATION:  Constitutional: Appears well-developed and well-nourished. No distress. HENT: Normocephalic. Marland Kitchen Oropharynx is clear and moist.  Eyes: Conjunctivae and EOM are normal. PERRLA, no scleral icterus.  Neck: Normal ROM. Neck supple. No JVD. No tracheal deviation. CVS: RRR, S1/S2 +, no murmurs, no gallops, no carotid bruit.  Pulmonary: Effort and breath sounds normal, no stridor, rhonchi, wheezes, rales.  Abdominal: Soft. BS +,  no distension, tenderness, rebound or guarding.  Musculoskeletal: Normal range of motion. No edema  and no tenderness.  Neuro: Alert. CN 2-12 grossly intact. No focal deficits. Skin: Skin is warm and dry. No rash noted. Psychiatric: Normal mood and affect.      LABORATORY PANEL:   CBC  Recent Labs Lab 06/09/17 1712  WBC 5.4  HGB 12.8  HCT 38.3  PLT 284   ------------------------------------------------------------------------------------------------------------------  Chemistries   Recent Labs Lab 06/09/17 1712 06/09/17 2336 06/10/17 0537  NA 141  --  139  K 2.8*  --  3.7  CL 108  --  111  CO2 17*  --  24  GLUCOSE 100*  --  94  BUN 11  --  9  CREATININE 0.53  --  0.41*  CALCIUM 8.1*  --  7.9*  MG  --  1.7  --   AST 45*  --   --   ALT 25  --   --   ALKPHOS 77  --   --   BILITOT 1.9*  --   --    ------------------------------------------------------------------------------------------------------------------  Cardiac Enzymes  Recent Labs Lab 06/09/17 1712 06/09/17 2336 06/10/17 0537  TROPONINI 0.07* 0.09* 0.11*   ------------------------------------------------------------------------------------------------------------------  RADIOLOGY:  Dg Ribs Unilateral W/chest Right  Result Date: 06/09/2017 CLINICAL DATA:  Patient found on the floor. Confusion. Right-sided pain. EXAM: RIGHT RIBS AND CHEST - 3+ VIEW COMPARISON:  05/10/2017 in previous FINDINGS: Heart size is normal. Mediastinal shadows are normal. Left chest is clear. There is chronic elevation of the right hemidiaphragm with some right effusion in volume loss in the right lower lung. Right lower lung pneumonia not excluded. Rib films on the right do not show a fracture. IMPRESSION: No evidence of rib fracture  on the right. Elevated right hemidiaphragm. Small right effusion. Right lower lung density that could be atelectasis and/or pneumonia. Electronically Signed   By: Nelson Chimes M.D.   On: 06/09/2017 18:00   Dg Lumbar Spine 2-3 Views  Result Date: 06/09/2017 CLINICAL DATA:  Found on the floor.   Right-sided pain. EXAM: LUMBAR SPINE - 2-3 VIEW COMPARISON:  05/10/2017 FINDINGS: Curvature convex to the left. 8 mm anterolisthesis L4-5 because of facet arthropathy. Disc space narrowing, most pronounced at L2-3. Newly seen inferior endplate deformity at L1 which could represent a recent fracture. This was not visible on the CT of 05/10/2017. Lower thoracic degenerative changes as seen previously. IMPRESSION: Chronic degenerative changes throughout the lumbar spine. Newly seen inferior endplate deformity at L1 that could represent a recent minor inferior endplate fracture. This was not visible on the recent CT. Electronically Signed   By: Nelson Chimes M.D.   On: 06/09/2017 18:02   Ct Head Wo Contrast  Result Date: 06/09/2017 CLINICAL DATA:  Altered mental status. EXAM: CT HEAD WITHOUT CONTRAST TECHNIQUE: Contiguous axial images were obtained from the base of the skull through the vertex without intravenous contrast. COMPARISON:  CT head dated March 02, 2017. FINDINGS: Brain: No evidence of acute infarction, hemorrhage, hydrocephalus, extra-axial collection or mass lesion/mass effect. Stable age related cerebral atrophy and chronic microvascular ischemic white matter disease. Unchanged right cerebellar encephalomalacia. Vascular: Atherosclerotic vascular calcification of the carotid siphons. No hyperdense vessel. Skull: Prior right suboccipital craniotomy. Negative for fracture or focal lesion. Sinuses/Orbits: Partial opacification of the left mastoid air cells, improved when compared to prior study. No acute orbital finding. Other: None. IMPRESSION: 1. No acute intracranial abnormality. Stable atrophy and chronic microvascular ischemic white matter disease. 2. Partial opacification of the left mastoid air cells, improved when compared to prior study. Electronically Signed   By: Titus Dubin M.D.   On: 06/09/2017 17:40   Dg Hand 2 View Left  Result Date: 06/09/2017 CLINICAL DATA:  71 year old with  current history of rheumatoid arthritis to has had multiple falls recently and was found on the floor of her home earlier today. Left hand pain. Initial encounter. EXAM: LEFT HAND - 2 VIEW COMPARISON:  No prior hand x-rays.  Left wrist x-rays 09/19/2014. FINDINGS: No evidence of acute fracture. Severe osseous demineralization. Severe deformities at all of the MCP joints related to the rheumatoid arthritis. Prior ORIF of a distal radius fracture with healing. IMPRESSION: 1. No acute osseous abnormality. 2. Deformities involving the MCP joints related to chronic rheumatoid arthritis. 3. Osseous demineralization. Electronically Signed   By: Evangeline Dakin M.D.   On: 06/09/2017 19:18   Dg Hip Unilat W Or Wo Pelvis 2-3 Views Right  Result Date: 06/09/2017 CLINICAL DATA:  Found on the floor.  Right-sided pain. EXAM: DG HIP (WITH OR WITHOUT PELVIS) 2-3V RIGHT COMPARISON:  None. FINDINGS: There is no evidence of hip fracture or dislocation. There is no evidence of arthropathy or other focal bone abnormality. Extensive regional arterial calcification. IMPRESSION: Negative. Electronically Signed   By: Nelson Chimes M.D.   On: 06/09/2017 18:04     ASSESSMENT AND PLAN:   71 year old female with history of essential hypertension, hyperlipidemia and chronic diastolic heart failure who presents with syncope.  1. Mechanical fall: Patient has been seen and evaluated by cardiology for possible syncope however it does not appear the patient has had a syncopal episode.. She has no arrhythmia or pauses on telemetry monitoring. She needs physical therapy consult.  2. Right  hip pain: We'll obtain CT scan to evaluate for hip fracture  3. Mild rhabdomyolysis after mechanical fall: CK has improved with IV fluids  4. Hypokalemia: This is improved with IV fluids.  5. L1 compression fracture: Orthopedic surgery consultation  6. Diabetes: Continue sliding scale Diabetes nurse consultation requested. 7. Essential  hypertension: Continue lisinopril  8. Chronic diastolic heart failure without signs of exacerbation: Continue Lasix  9. Depression/anxiety: Continue BuSpar/Lamotrigine/Remeron  10. Elevated troponin due to rhabdomyolysis. He is ruled out for ACS.  Needs physical therapy consultation for disposition  Management plans discussed with the patient and she is in agreement.  CODE STATUS: DNR  TOTAL TIME TAKING CARE OF THIS PATIENT: 30 minutes.     POSSIBLE D/C 1-2 days, DEPENDING ON CLINICAL CONDITION.   Mercadies Co M.D on 06/10/2017 at 10:06 AM  Between 7am to 6pm - Pager - [phone] After 6pm go to [email or web address] - password [email or web address] [facility]  Office  [phone]  CC: Primary care physician; Lavera Guise, MD  Note: This dictation was prepared with Dragon dictation along with smaller phrase technology. Any transcriptional errors that result from this process are unintentional.

## 2017-06-10 NOTE — Progress Notes (Signed)
BP low and HR high. Patient has been given pain medication a few hours ago, but also just worked with PT. Dr. Benjie Karvonen aware, does not want to do IV fluids at this time since patient has CHF. Instructed to monitor and continue with pain management.

## 2017-06-10 NOTE — Progress Notes (Signed)
Low BP. Lasix and BP meds held per Dr. Benjie Karvonen.

## 2017-06-10 NOTE — Care Management (Signed)
Patient is currently followed by [facility] PT OT and SW.  Physical therapy has evaluated and recommending skilled nursing facility placement. Patient may consider.  Notified [facility] of admission.  Has been followed by [facility]

## 2017-06-10 NOTE — Plan of Care (Signed)
Problem: Education: Goal: Knowledge of [facility] Education information/materials will improve Outcome: Progressing Admitted with a fall, found down. Potassium low, PO replaced now back up to 3.7. Complaints of nausea x1, treated with zofran, with relief. Up to [facility] with 1 assist. 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 Pt is on a low bed.

## 2017-06-10 NOTE — Progress Notes (Addendum)
Inpatient Diabetes Program Recommendations  AACE/ADA: New Consensus Statement on Inpatient Glycemic Control (2015)  Target Ranges:  Prepandial:   less than 140 mg/dL      Peak postprandial:   less than 180 mg/dL (1-2 hours)      Critically ill patients:  140 - 180 mg/dL   Lab Results  Component Value Date   GLUCAP 96 06/10/2017   HGBA1C 5.6 02/24/2017    Review of Glycemic Control  Results for Meghan, [name] (MRN [id number]) as of 06/10/2017 11:01  Ref. Range 06/09/2017 22:37 06/10/2017 07:57  Glucose-Capillary Latest Ref Range: 65 - 99 mg/dL 123 (H) 96    Diabetes history: Type 2 Outpatient Diabetes medications: Onglyza 5mg  qday Current orders for Inpatient glycemic control: Novolog 0-9 units tid, Novolog 0-5 units qhs  Inpatient Diabetes Program Recommendations:  Per ADA recommendations "consider performing an A1C on all patients with diabetes or hyperglycemia admitted to the hospital if not performed in the prior 3 months".  Please change diet to heart healthy / carb modified  Agree with current medications for blood sugar management.    Gentry Fitz, RN, BA, MHA, CDE Diabetes Coordinator Inpatient Diabetes Program  [phone] (Team Pager) [phone] ([place]) 06/10/2017 11:01 AM

## 2017-06-11 ENCOUNTER — Ambulatory Visit: Payer: Self-pay | Admitting: *Deleted

## 2017-06-11 ENCOUNTER — Inpatient Hospital Stay: Payer: Medicare Other

## 2017-06-11 LAB — GLUCOSE, CAPILLARY
GLUCOSE-CAPILLARY: 223 mg/dL — AB (ref 65–99)
GLUCOSE-CAPILLARY: 128 mg/dL — AB (ref 65–99)
Glucose-Capillary: 79 mg/dL (ref 65–99)
Glucose-Capillary: 147 mg/dL — ABNORMAL HIGH (ref 65–99)

## 2017-06-11 MED ORDER — METOPROLOL TARTRATE 25 MG PO TABS
25.0000 mg | ORAL_TABLET | Freq: Two times a day (BID) | ORAL | Status: DC
  Administered 2017-06-11 – 2017-06-15 (×7): 25 mg via ORAL
  Filled 2017-06-11 (×7): qty 1

## 2017-06-11 NOTE — Progress Notes (Signed)
[place] at [place] NAME: Meghan [name]    MR#:  [id number]  DATE OF BIRTH:  01-Dec-1945  SUBJECTIVE:   Still with back and  right hip pain  REVIEW OF SYSTEMS:    Review of Systems  Constitutional: Negative for fever, chills weight loss HENT: Negative for ear pain, nosebleeds, congestion, facial swelling, rhinorrhea, neck pain, neck stiffness and ear discharge.   Respiratory: Negative for cough, shortness of breath, wheezing  Cardiovascular: Negative for chest pain, palpitations and leg swelling.  Gastrointestinal: Negative for heartburn, abdominal pain, vomiting, diarrhea or consitpation Genitourinary: Negative for dysuria, urgency, frequency, hematuria Musculoskeletal: Right hip pain  And back pain Neurological: Negative for dizziness, seizures, syncope, focal weakness,  numbness and headaches.  Hematological: Bruises easily  Psychiatric/Behavioral: Negative for hallucinations, confusion, dysphoric mood    Tolerating Diet: yes      DRUG ALLERGIES:   Allergies  Allergen Reactions  . Gabapentin Other (See Comments)    Pt states that it causes her BP to drop.   . Naproxen Hives  . Nsaids Other (See Comments)    Reaction:  Unknown     VITALS:  Blood pressure 100/64, pulse (!) 110, temperature 97.8 F (36.6 C), temperature source Oral, resp. rate 14, height 4\' 8"  (1.422 m), weight 51.6 kg (113 lb 12.8 oz), SpO2 95 %.  PHYSICAL EXAMINATION:  Constitutional: Appears well-developed and well-nourished. No distress. HENT: Normocephalic. Marland Kitchen Oropharynx is clear and moist.  Eyes: Conjunctivae and EOM are normal. PERRLA, no scleral icterus.  Neck: Normal ROM. Neck supple. No JVD. No tracheal deviation. CVS: RRR, S1/S2 +, no murmurs, no gallops, no carotid bruit.  Pulmonary: Effort and breath sounds normal, no stridor, rhonchi, wheezes, rales.  Abdominal: Soft. BS +,  no distension, tenderness, rebound or guarding.  Musculoskeletal:  right hip pain decreased ROM. No edema and no tenderness.  Neuro: Alert. CN 2-12 grossly intact. No focal deficits. Skin: Skin is warm and dry. No rash noted. Psychiatric: Normal mood and affect.      LABORATORY PANEL:   CBC  Recent Labs Lab 06/09/17 1712  WBC 5.4  HGB 12.8  HCT 38.3  PLT 284   ------------------------------------------------------------------------------------------------------------------  Chemistries   Recent Labs Lab 06/09/17 1712 06/09/17 2336 06/10/17 0537  NA 141  --  139  K 2.8*  --  3.7  CL 108  --  111  CO2 17*  --  24  GLUCOSE 100*  --  94  BUN 11  --  9  CREATININE 0.53  --  0.41*  CALCIUM 8.1*  --  7.9*  MG  --  1.7  --   AST 45*  --   --   ALT 25  --   --   ALKPHOS 77  --   --   BILITOT 1.9*  --   --    ------------------------------------------------------------------------------------------------------------------  Cardiac Enzymes  Recent Labs Lab 06/10/17 1026 06/10/17 1612 06/10/17 2309  TROPONINI 0.07* 0.06* 0.08*   ------------------------------------------------------------------------------------------------------------------  RADIOLOGY:  Dg Ribs Unilateral W/chest Right  Result Date: 06/09/2017 CLINICAL DATA:  Patient found on the floor. Confusion. Right-sided pain. EXAM: RIGHT RIBS AND CHEST - 3+ VIEW COMPARISON:  05/10/2017 in previous FINDINGS: Heart size is normal. Mediastinal shadows are normal. Left chest is clear. There is chronic elevation of the right hemidiaphragm with some right effusion in volume loss in the right lower lung. Right lower lung pneumonia not excluded. Rib films on the right do not show  a fracture. IMPRESSION: No evidence of rib fracture on the right. Elevated right hemidiaphragm. Small right effusion. Right lower lung density that could be atelectasis and/or pneumonia. Electronically Signed   By: Nelson Chimes M.D.   On: 06/09/2017 18:00   Dg Lumbar Spine 2-3 Views  Result Date:  06/09/2017 CLINICAL DATA:  Found on the floor.  Right-sided pain. EXAM: LUMBAR SPINE - 2-3 VIEW COMPARISON:  05/10/2017 FINDINGS: Curvature convex to the left. 8 mm anterolisthesis L4-5 because of facet arthropathy. Disc space narrowing, most pronounced at L2-3. Newly seen inferior endplate deformity at L1 which could represent a recent fracture. This was not visible on the CT of 05/10/2017. Lower thoracic degenerative changes as seen previously. IMPRESSION: Chronic degenerative changes throughout the lumbar spine. Newly seen inferior endplate deformity at L1 that could represent a recent minor inferior endplate fracture. This was not visible on the recent CT. Electronically Signed   By: Nelson Chimes M.D.   On: 06/09/2017 18:02   Ct Head Wo Contrast  Result Date: 06/09/2017 CLINICAL DATA:  Altered mental status. EXAM: CT HEAD WITHOUT CONTRAST TECHNIQUE: Contiguous axial images were obtained from the base of the skull through the vertex without intravenous contrast. COMPARISON:  CT head dated March 02, 2017. FINDINGS: Brain: No evidence of acute infarction, hemorrhage, hydrocephalus, extra-axial collection or mass lesion/mass effect. Stable age related cerebral atrophy and chronic microvascular ischemic white matter disease. Unchanged right cerebellar encephalomalacia. Vascular: Atherosclerotic vascular calcification of the carotid siphons. No hyperdense vessel. Skull: Prior right suboccipital craniotomy. Negative for fracture or focal lesion. Sinuses/Orbits: Partial opacification of the left mastoid air cells, improved when compared to prior study. No acute orbital finding. Other: None. IMPRESSION: 1. No acute intracranial abnormality. Stable atrophy and chronic microvascular ischemic white matter disease. 2. Partial opacification of the left mastoid air cells, improved when compared to prior study. Electronically Signed   By: Titus Dubin M.D.   On: 06/09/2017 17:40   Dg Hand 2 View Left  Result Date:  06/09/2017 CLINICAL DATA:  71 year old with current history of rheumatoid arthritis to has had multiple falls recently and was found on the floor of her home earlier today. Left hand pain. Initial encounter. EXAM: LEFT HAND - 2 VIEW COMPARISON:  No prior hand x-rays.  Left wrist x-rays 09/19/2014. FINDINGS: No evidence of acute fracture. Severe osseous demineralization. Severe deformities at all of the MCP joints related to the rheumatoid arthritis. Prior ORIF of a distal radius fracture with healing. IMPRESSION: 1. No acute osseous abnormality. 2. Deformities involving the MCP joints related to chronic rheumatoid arthritis. 3. Osseous demineralization. Electronically Signed   By: Evangeline Dakin M.D.   On: 06/09/2017 19:18   Ct Hip Right Wo Contrast  Result Date: 06/10/2017 CLINICAL DATA:  History rheumatoid arthritis. Severe fall with right hip pain. EXAM: CT OF THE RIGHT HIP WITHOUT CONTRAST TECHNIQUE: Multidetector CT imaging of the right hip was performed according to the standard protocol. Multiplanar CT image reconstructions were also generated. COMPARISON:  None. FINDINGS: Bones/Joint/Cartilage No fracture or dislocation. Normal alignment. No joint effusion. Hip joint space is maintained. No lytic or sclerotic osseous lesion. Mild osteoarthritis of the sacroiliac joint. Ligaments Ligaments are suboptimally evaluated by CT. Muscles and Tendons Muscles are normal.  No intramuscular fluid collection or hematoma. Soft tissue No fluid collection or hematoma. No soft tissue mass. Peripheral vascular atherosclerotic disease. IMPRESSION: No acute osseous injury of the right hip. Electronically Signed   By: Kathreen Devoid   On: 06/10/2017 11:38  Dg Hip Unilat W Or Wo Pelvis 2-3 Views Right  Result Date: 06/09/2017 CLINICAL DATA:  Found on the floor.  Right-sided pain. EXAM: DG HIP (WITH OR WITHOUT PELVIS) 2-3V RIGHT COMPARISON:  None. FINDINGS: There is no evidence of hip fracture or dislocation. There is  no evidence of arthropathy or other focal bone abnormality. Extensive regional arterial calcification. IMPRESSION: Negative. Electronically Signed   By: Nelson Chimes M.D.   On: 06/09/2017 18:04     ASSESSMENT AND PLAN:   71 year old female with history of essential hypertension, hyperlipidemia and chronic diastolic heart failure who presents with syncope.  1. Mechanical fall: Patient has been seen and evaluated by cardiology for possible syncope however it does not appear the patient has had a syncopal episode. She has no arrhythmia or pauses on telemetry monitoring. She will need skilled nursing facility upon discharge  2. Right hip pain: CT scan does not show evidence of hip fracture. I am suspecting this is from the L1 compression fracture that is radiating to her right hip or perhaps a bone bruise.  3. Mild rhabdomyolysis after mechanical fall: CK has improved with IV fluids  4. Hypokalemia: This is improved with IV fluids.  5. L1 compression fracture: Orthopedic surgery consultation appreciated. MRI ordered by Dr. Rudene Christians  6. Diabetes: Continue sliding scale Diabetes nurse consultation appreciated7. Essential hypertension: Continue lisinopril  8. Chronic diastolic heart failure without signs of exacerbation: Continue Lasix  9. Depression/anxiety: Continue BuSpar/Lamotrigine/Remeron  10. Elevated troponin due to rhabdomyolysis. He is ruled out for ACS.  Physical therapy is recommended skilled nursing facility upon discharge. Social worker consult placed.  Management plans discussed with the patient and she is in agreement.  CODE STATUS: DNR  TOTAL TIME TAKING CARE OF THIS PATIENT: 24 minutes.     POSSIBLE D/C 1-2 days, DEPENDING ON CLINICAL CONDITION.   Krue Peterka M.D on 06/11/2017 at 10:22 AM  Between 7am to 6pm - Pager - [phone] After 6pm go to [email or web address] - password [email or web address] [facility]  Office  [phone]  CC: Primary care physician;  Lavera Guise, MD  Note: This dictation was prepared with Dragon dictation along with smaller phrase technology. Any transcriptional errors that result from this process are unintentional.

## 2017-06-11 NOTE — Progress Notes (Signed)
Notified Dr. Nehemiah Massed of pt HR 120-135, and held lisinopril for soft blood pressure this AM (100/64). Nehemiah Massed would like to assess pt before ordering any further interventions as patient is asymptomatic at this time.

## 2017-06-11 NOTE — Care Management Important Message (Signed)
Important Message  Patient Details  Name: Meghan [name] MRN: [id number] Date of Birth: 1946/02/12   Medicare Important Message Given:  Yes Signed IM notice given    Katrina Stack, RN 06/11/2017, 10:31 AM

## 2017-06-11 NOTE — Progress Notes (Signed)
MRI shows isolated L1 fracture Recommend L1 kyphoplasty on Monday.

## 2017-06-11 NOTE — Progress Notes (Signed)
Patient presented to the ED on 06/09/2017 after a fall at home.  Patient with hx of HTN, HLD, chronic diastolic HF, and syncope.  Patient's last EF measured by echo on 03/21/2017 was 20 - 25%.  Note:    Patient is scheduled for a loop recorder on 06/23/2017 by Dr. Saralyn Pilar.   Rounded on patient to educate/re-educate patient about CHF.  Patient is on contact precautions.  Lying in bed - low bed - due to recent fall.  Patient stated everything in her room had been cut off.  Upon asking her what she meant, she stated she could not find the control for her bed.  I located this for her.    Per Care Manager note on 06/10/2017:  Patient is currently followed by [facility] and SW.  Physical therapy has evaluated and is recommending skilled nursing facility placement. Has been followed by [facility].    Provided patient with "Living Better with Heart Failure" packet. Briefly reviewed definition of heart failure and signs and symptoms of an exacerbation. Reviewed importance of and reason behind checking weight daily in the AM, after using the bathroom, but before getting dressed.  Patient stated she has been doing the daily weights and recording them, but the [facility] RN has not been looking at them.    Discussed the heart failure zones.   Discussed when to call the Dr= weight gain of >2lb overnight of 5lb in a week,  Discussed yellow zone= call MD: weight gain of >2lb overnight of 5lb in a week, increased swelling, increased SOB when lying down, chest discomfort, dizziness, increased fatigue Red Zone= call 911: struggle to breath, fainting or near fainting, significant chest pain Reviewed low sodium diet-provided handout of recommended and not recommended foods  Reviewed cardiac medications used to treat heart failure.   Explained briefly why pt is on the medications (either make you feel better, live longer or keep you out of the hospital) and discussed monitoring and side effects.   Discussed exercise - PT  is recommending SNF upon discharge.    Noted the patient has had three previous new patient HF Clinic appointments and has cancelled each time.  Noted in previous note - September 2018 from [facility] RN  Patient had verbalized concerns that she has issues with transportation which interferes with her getting to MD appointments.  Patient's husband works.  Patient has three children, who all live locally. Patient did say her daughter takes her to her appointments sometimes if her husband cannot take her.  Patient stated she was not aware of any previous HF Clinic Appointments.  Explained the role of the HF Clinic to patient.   This RN made a new patient appointment for this patient in the HF Clinic on 06/16/2017 at 11:20 a.m.    Roanna Epley, RN, BSN, [facility] Cardiovascular and Pulmonary Nurse Navigator

## 2017-06-11 NOTE — Progress Notes (Signed)
[facility] Encounter Note  Patient: Meghan [name] / Admit Date: 06/09/2017 / Date of Encounter: 06/11/2017, 3:56 PM   Subjective: Patient has significant hip and lower back pain. The patient has been hemodynamically stable but heart rate of normal sinus rhythm and now mild sinus tachycardia 115 bpm. No evidence of congestive heart failure or anginal symptoms. No further episodes of syncope  Review of Systems: Positive for: Low back pain Negative for: Vision change, hearing change, syncope, dizziness, nausea, vomiting,diarrhea, bloody stool, stomach pain, cough, congestion, diaphoresis, urinary frequency, urinary pain,skin lesions, skin rashes Others previously listed  Objective: Telemetry: Sinus tachycardia Physical Exam: Blood pressure 100/64, pulse (!) 110, temperature 97.8 F (36.6 C), temperature source Oral, resp. rate 14, height 4\' 8"  (1.422 m), weight 51.6 kg (113 lb 12.8 oz), SpO2 95 %. Body mass index is 25.51 kg/m. General: Well developed, well nourished, in no acute distress. Head: Normocephalic, atraumatic, sclera non-icteric, no xanthomas, nares are without discharge. Neck: No apparent masses Lungs: Normal respirations with no wheezes, no rhonchi, no rales , no crackles   Heart: Regular rate and rhythm, normal S1 S2, no murmur, no rub, no gallop, PMI is normal size and placement, carotid upstroke normal without bruit, jugular venous pressure normal Abdomen: Soft, non-tender, non-distended with normoactive bowel sounds. No hepatosplenomegaly. Abdominal aorta is normal size without bruit Extremities: No edema, no clubbing, no cyanosis, no ulcers,  Peripheral: 2+ radial, 2+ femoral, 2+ dorsal pedal pulses Neuro: Alert and oriented. Moves all extremities spontaneously. Psych:  Responds to questions appropriately with a normal affect.   Intake/Output Summary (Last 24 hours) at 06/11/17 1556 Last data filed at 06/10/17 2102  Gross per 24 hour  Intake                 0 ml  Output               50 ml  Net              -50 ml    Inpatient Medications:  . busPIRone  7.5 mg Oral BID  . Chlorhexidine Gluconate Cloth  6 each Topical Q0600  . enoxaparin (LOVENOX) injection  40 mg Subcutaneous Q24H  . feeding supplement (ENSURE ENLIVE)  237 mL Oral BID BM  . furosemide  20 mg Oral Daily  . Influenza vac split quadrivalent PF  0.5 mL Intramuscular Tomorrow-1000  . insulin aspart  0-5 Units Subcutaneous QHS  . insulin aspart  0-9 Units Subcutaneous TID WC  . lamoTRIgine  100 mg Oral BID  . metoprolol tartrate  25 mg Oral BID  . mirtazapine  7.5 mg Oral QHS  . montelukast  10 mg Oral Daily  . mupirocin ointment  1 application Nasal BID  . pantoprazole  40 mg Oral Daily  . potassium chloride  20 mEq Oral BID  . pramipexole  0.25 mg Oral Daily  . predniSONE  5 mg Oral Q breakfast  . pregabalin  50 mg Oral BID  . topiramate  25 mg Oral QHS   Infusions:   Labs:  Recent Labs  06/09/17 1712 06/09/17 2336 06/10/17 0537  NA 141  --  139  K 2.8*  --  3.7  CL 108  --  111  CO2 17*  --  24  GLUCOSE 100*  --  94  BUN 11  --  9  CREATININE 0.53  --  0.41*  CALCIUM 8.1*  --  7.9*  MG  --  1.7  --  Recent Labs  06/09/17 1712  AST 45*  ALT 25  ALKPHOS 77  BILITOT 1.9*  PROT 5.8*  ALBUMIN 2.6*    Recent Labs  06/09/17 1712  WBC 5.4  NEUTROABS 4.3  HGB 12.8  HCT 38.3  MCV 87.0  PLT 284    Recent Labs  06/09/17 1712  06/10/17 0537 06/10/17 1026 06/10/17 1612 06/10/17 2309  CKTOTAL 457*  --  186  --   --   --   TROPONINI 0.07*  < > 0.11* 0.07* 0.06* 0.08*  < > = values in this interval not displayed. Invalid input(s): POCBNP No results for input(s): HGBA1C in the last 72 hours.   Weights: Filed Weights   06/09/17 1708 06/09/17 2155  Weight: 53.5 kg (118 lb) 51.6 kg (113 lb 12.8 oz)     Radiology/Studies:  Dg Ribs Unilateral W/chest Right  Result Date: 06/09/2017 CLINICAL DATA:  Patient found on the floor.  Confusion. Right-sided pain. EXAM: RIGHT RIBS AND CHEST - 3+ VIEW COMPARISON:  05/10/2017 in previous FINDINGS: Heart size is normal. Mediastinal shadows are normal. Left chest is clear. There is chronic elevation of the right hemidiaphragm with some right effusion in volume loss in the right lower lung. Right lower lung pneumonia not excluded. Rib films on the right do not show a fracture. IMPRESSION: No evidence of rib fracture on the right. Elevated right hemidiaphragm. Small right effusion. Right lower lung density that could be atelectasis and/or pneumonia. Electronically Signed   By: Nelson Chimes M.D.   On: 06/09/2017 18:00   Dg Lumbar Spine 2-3 Views  Result Date: 06/09/2017 CLINICAL DATA:  Found on the floor.  Right-sided pain. EXAM: LUMBAR SPINE - 2-3 VIEW COMPARISON:  05/10/2017 FINDINGS: Curvature convex to the left. 8 mm anterolisthesis L4-5 because of facet arthropathy. Disc space narrowing, most pronounced at L2-3. Newly seen inferior endplate deformity at L1 which could represent a recent fracture. This was not visible on the CT of 05/10/2017. Lower thoracic degenerative changes as seen previously. IMPRESSION: Chronic degenerative changes throughout the lumbar spine. Newly seen inferior endplate deformity at L1 that could represent a recent minor inferior endplate fracture. This was not visible on the recent CT. Electronically Signed   By: Nelson Chimes M.D.   On: 06/09/2017 18:02   Ct Head Wo Contrast  Result Date: 06/09/2017 CLINICAL DATA:  Altered mental status. EXAM: CT HEAD WITHOUT CONTRAST TECHNIQUE: Contiguous axial images were obtained from the base of the skull through the vertex without intravenous contrast. COMPARISON:  CT head dated March 02, 2017. FINDINGS: Brain: No evidence of acute infarction, hemorrhage, hydrocephalus, extra-axial collection or mass lesion/mass effect. Stable age related cerebral atrophy and chronic microvascular ischemic white matter disease. Unchanged right  cerebellar encephalomalacia. Vascular: Atherosclerotic vascular calcification of the carotid siphons. No hyperdense vessel. Skull: Prior right suboccipital craniotomy. Negative for fracture or focal lesion. Sinuses/Orbits: Partial opacification of the left mastoid air cells, improved when compared to prior study. No acute orbital finding. Other: None. IMPRESSION: 1. No acute intracranial abnormality. Stable atrophy and chronic microvascular ischemic white matter disease. 2. Partial opacification of the left mastoid air cells, improved when compared to prior study. Electronically Signed   By: Titus Dubin M.D.   On: 06/09/2017 17:40   Mr Lumbar Spine Wo Contrast  Result Date: 06/11/2017 CLINICAL DATA:  Low back pain radiating into the right hip and leg for 3 weeks since a fall. EXAM: MRI LUMBAR SPINE WITHOUT CONTRAST TECHNIQUE: Multiplanar, multisequence MR imaging of  the lumbar spine was performed. No intravenous contrast was administered. COMPARISON:  MRI lumbar spine 05/19/2016. FINDINGS: Segmentation:  Standard. Alignment: Grade 1 retrolisthesis L3 on L4, L4 on L5 and L5 on S1 due to facet arthropathy is unchanged. Convex left scoliosis with the apex at L2-3 is also unchanged. Vertebrae: The patient has an inferior endplate compression fracture of L1 with vertebral body height loss of up to 40-50%. There is marrow edema in the vertebral body consistent with acute or subacute injury. No other fracture is identified. Scattered degenerative endplate signal change is most notable eccentric to the right at L2-3. Conus medullaris: Extends to the L1 level and appears normal. Paraspinal and other soft tissues: Negative. Disc levels: T9-10, T10-11 and T11-12 are imaged in the sagittal plane only. The central canal and foramina appear open at each level. Small right paracentral protrusion at T11-12 is noted. T12-L1: Central disc protrusion with some caudal extension is identified and was present on the prior  examination. The disc indents the ventral thecal sac but does not compress the conus. Foramina are open. The appearance is not markedly changed. L1-2: The central canal and foramina are widely patent. No bony retropulsion off the inferior endplate of L1. L2-3: Disc space narrowing and a broad-based protrusion more prominent to the right are unchanged. Right worse than left foraminal narrowing is seen. The central canal and right subarticular recess are mildly narrowed. L3-4: Facet degenerative disease and a shallow broad-based disc bulge are identified. Mild central canal and left lateral recess narrowing are unchanged. There is also bilateral foraminal narrowing which appears slightly worse on the left and unchanged. L4-5: The patient has a broad-based disc bulge with a superimposed left lateral recess and foraminal protrusion. Moderate moderately severe central canal stenosis is present with left much worse than right lateral recess and foraminal narrowing. The appearance is not markedly changed. L5-S1: Bilateral facet degenerative disease and a shallow broad-based disc bulge. The central canal is open. Mild to moderate foraminal narrowing is worse on the left. The appearance is unchanged. IMPRESSION: The examination is positive for an acute or subacute inferior endplate compression fracture of L1 with vertebral body height loss of 40-50%. No bony retropulsion. No change in multilevel spondylosis appearing worst at L4-5 as described above. Electronically Signed   By: Inge Rise M.D.   On: 06/11/2017 11:17   Dg Hand 2 View Left  Result Date: 06/09/2017 CLINICAL DATA:  71 year old with current history of rheumatoid arthritis to has had multiple falls recently and was found on the floor of her home earlier today. Left hand pain. Initial encounter. EXAM: LEFT HAND - 2 VIEW COMPARISON:  No prior hand x-rays.  Left wrist x-rays 09/19/2014. FINDINGS: No evidence of acute fracture. Severe osseous  demineralization. Severe deformities at all of the MCP joints related to the rheumatoid arthritis. Prior ORIF of a distal radius fracture with healing. IMPRESSION: 1. No acute osseous abnormality. 2. Deformities involving the MCP joints related to chronic rheumatoid arthritis. 3. Osseous demineralization. Electronically Signed   By: Evangeline Dakin M.D.   On: 06/09/2017 19:18   Ct Hip Right Wo Contrast  Result Date: 06/10/2017 CLINICAL DATA:  History rheumatoid arthritis. Severe fall with right hip pain. EXAM: CT OF THE RIGHT HIP WITHOUT CONTRAST TECHNIQUE: Multidetector CT imaging of the right hip was performed according to the standard protocol. Multiplanar CT image reconstructions were also generated. COMPARISON:  None. FINDINGS: Bones/Joint/Cartilage No fracture or dislocation. Normal alignment. No joint effusion. Hip joint space is  maintained. No lytic or sclerotic osseous lesion. Mild osteoarthritis of the sacroiliac joint. Ligaments Ligaments are suboptimally evaluated by CT. Muscles and Tendons Muscles are normal.  No intramuscular fluid collection or hematoma. Soft tissue No fluid collection or hematoma. No soft tissue mass. Peripheral vascular atherosclerotic disease. IMPRESSION: No acute osseous injury of the right hip. Electronically Signed   By: Kathreen Devoid   On: 06/10/2017 11:38   Dg Hip Unilat W Or Wo Pelvis 2-3 Views Right  Result Date: 06/09/2017 CLINICAL DATA:  Found on the floor.  Right-sided pain. EXAM: DG HIP (WITH OR WITHOUT PELVIS) 2-3V RIGHT COMPARISON:  None. FINDINGS: There is no evidence of hip fracture or dislocation. There is no evidence of arthropathy or other focal bone abnormality. Extensive regional arterial calcification. IMPRESSION: Negative. Electronically Signed   By: Nelson Chimes M.D.   On: 06/09/2017 18:04     Assessment and Recommendation  71 y.o. female with hypertension diabetes chronic obstructive pulmonary disease and hyperlipidemia with recurrent episodes  of syncope of unknown etiology with previous thorough workup and no current evidence of congestive heart failure or myocardial infarction 1. Discontinuation of lisinopril due to concerns of hypotension 2. Begin metoprolol tartrate 25 mg twice per day for better heart rate control 3. Begin ambulation if able despite a compression fracture 4. Further consideration of link device for assessment of the syncopal episode caused by rhythm disturbances in the future 5. No other further cardiac diagnostics necessary at this time  Signed, Serafina Royals M.D. FACC

## 2017-06-11 NOTE — Plan of Care (Signed)
Problem: Safety: Goal: Ability to remain free from injury will improve Outcome: Progressing sizewise bed

## 2017-06-11 NOTE — Consult Note (Signed)
   [facility] CM Inpatient Consult   06/11/2017  SHYE DOTY 1945/12/18 [id number]   Patient is currently active with [facility] Care Management for chronic disease management services.  Patient has been engaged by a [employer].  Our community based plan of care has focused on disease management and community resource support.  Patient will receive a post discharge transition of care call and will be evaluated for monthly home visits for assessments and disease process education.  Made Inpatient Case Manager aware that [place] [facility] Management following. Of note, [facility] Care Management services does not replace or interfere with any services that are needed or arranged by inpatient case management or social work.  For additional questions or referrals please contact:  Keali Mccraw RN, [facility] Liaison  [phone]) [place] [phone]) Toll free office

## 2017-06-11 NOTE — Care Management (Addendum)
Patient is agreeable to skilled nursing placement.  Patient is for kyphoplasty 11.5.2018

## 2017-06-12 LAB — BASIC METABOLIC PANEL
ANION GAP: 7 (ref 5–15)
BUN: 14 mg/dL (ref 6–20)
CHLORIDE: 104 mmol/L (ref 101–111)
CO2: 27 mmol/L (ref 22–32)
Calcium: 8.6 mg/dL — ABNORMAL LOW (ref 8.9–10.3)
Creatinine, Ser: 0.43 mg/dL — ABNORMAL LOW (ref 0.44–1.00)
GFR calc Af Amer: >60 mL/min (ref >60)
GLUCOSE: 114 mg/dL — AB (ref 65–99)
Potassium: 4.4 mmol/L (ref 3.5–5.1)
SODIUM: 138 mmol/L (ref 135–145)

## 2017-06-12 LAB — GLUCOSE, CAPILLARY
Glucose-Capillary: 109 mg/dL — ABNORMAL HIGH (ref 65–99)
Glucose-Capillary: 181 mg/dL — ABNORMAL HIGH (ref 65–99)
Glucose-Capillary: 177 mg/dL — ABNORMAL HIGH (ref 65–99)
Glucose-Capillary: 159 mg/dL — ABNORMAL HIGH (ref 65–99)

## 2017-06-12 MED ORDER — OXYCODONE-ACETAMINOPHEN 5-325 MG PO TABS
1.0000 | ORAL_TABLET | ORAL | Status: DC | PRN
  Administered 2017-06-12 – 2017-06-14 (×7): 1 via ORAL
  Filled 2017-06-12 (×7): qty 1

## 2017-06-12 MED ORDER — SODIUM CHLORIDE 0.9% FLUSH
3.0000 mL | Freq: Two times a day (BID) | INTRAVENOUS | Status: DC
  Administered 2017-06-12 – 2017-06-14 (×4): 3 mL via INTRAVENOUS

## 2017-06-12 MED ORDER — CEFAZOLIN SODIUM-DEXTROSE 1-4 GM/50ML-% IV SOLN
1.0000 g | INTRAVENOUS | Status: DC
  Filled 2017-06-12: qty 50

## 2017-06-12 MED ORDER — DEXTROSE 5 % IV SOLN
1000.0000 mg | INTRAVENOUS | Status: AC
  Filled 2017-06-12: qty 10

## 2017-06-12 NOTE — Progress Notes (Signed)
[facility] Encounter Note  Patient: Meghan [name] / Admit Date: 06/09/2017 / Date of Encounter: 06/12/2017, 6:24 AM   Subjective: Patient has significant hip and lower back pain. The patient has been hemodynamically stable but heart rate of normal sinus rhythm and now mild sinus tachycardia 115 yesterday and now significantly improved after addition of higher dose of metoprolol No evidence of congestive heart failure or anginal symptoms. No further episodes of syncope  Review of Systems: Positive for: Low back pain Negative for: Vision change, hearing change, syncope, dizziness, nausea, vomiting,diarrhea, bloody stool, stomach pain, cough, congestion, diaphoresis, urinary frequency, urinary pain,skin lesions, skin rashes Others previously listed  Objective: Telemetry: Sinus tachycardia Physical Exam: Blood pressure 116/80, pulse 100, temperature (!) 97.5 F (36.4 C), temperature source Oral, resp. rate 17, height 4\' 8"  (1.422 m), weight 53.4 kg (117 lb 12.8 oz), SpO2 95 %. Body mass index is 26.41 kg/m. General: Well developed, well nourished, in no acute distress. Head: Normocephalic, atraumatic, sclera non-icteric, no xanthomas, nares are without discharge. Neck: No apparent masses Lungs: Normal respirations with no wheezes, no rhonchi, no rales , no crackles   Heart: Regular rate and rhythm, normal S1 S2, no murmur, no rub, no gallop, PMI is normal size and placement, carotid upstroke normal without bruit, jugular venous pressure normal Abdomen: Soft, non-tender, non-distended with normoactive bowel sounds. No hepatosplenomegaly. Abdominal aorta is normal size without bruit Extremities: No edema, no clubbing, no cyanosis, no ulcers,  Peripheral: 2+ radial, 2+ femoral, 2+ dorsal pedal pulses Neuro: Alert and oriented. Moves all extremities spontaneously. Psych:  Responds to questions appropriately with a normal affect.   Intake/Output Summary (Last 24 hours) at  06/12/17 0624 Last data filed at 06/12/17 0450  Gross per 24 hour  Intake                0 ml  Output              350 ml  Net             -350 ml    Inpatient Medications:  . busPIRone  7.5 mg Oral BID  . Chlorhexidine Gluconate Cloth  6 each Topical Q0600  . enoxaparin (LOVENOX) injection  40 mg Subcutaneous Q24H  . feeding supplement (ENSURE ENLIVE)  237 mL Oral BID BM  . furosemide  20 mg Oral Daily  . Influenza vac split quadrivalent PF  0.5 mL Intramuscular Tomorrow-1000  . insulin aspart  0-5 Units Subcutaneous QHS  . insulin aspart  0-9 Units Subcutaneous TID WC  . lamoTRIgine  100 mg Oral BID  . metoprolol tartrate  25 mg Oral BID  . mirtazapine  7.5 mg Oral QHS  . montelukast  10 mg Oral Daily  . mupirocin ointment  1 application Nasal BID  . pantoprazole  40 mg Oral Daily  . potassium chloride  20 mEq Oral BID  . pramipexole  0.25 mg Oral Daily  . predniSONE  5 mg Oral Q breakfast  . pregabalin  50 mg Oral BID  . topiramate  25 mg Oral QHS   Infusions:   Labs:  Recent Labs  06/09/17 1712 06/09/17 2336 06/10/17 0537  NA 141  --  139  K 2.8*  --  3.7  CL 108  --  111  CO2 17*  --  24  GLUCOSE 100*  --  94  BUN 11  --  9  CREATININE 0.53  --  0.41*  CALCIUM 8.1*  --  7.9*  MG  --  1.7  --     Recent Labs  06/09/17 1712  AST 45*  ALT 25  ALKPHOS 77  BILITOT 1.9*  PROT 5.8*  ALBUMIN 2.6*    Recent Labs  06/09/17 1712  WBC 5.4  NEUTROABS 4.3  HGB 12.8  HCT 38.3  MCV 87.0  PLT 284    Recent Labs  06/09/17 1712  06/10/17 0537 06/10/17 1026 06/10/17 1612 06/10/17 2309  CKTOTAL 457*  --  186  --   --   --   TROPONINI 0.07*  < > 0.11* 0.07* 0.06* 0.08*  < > = values in this interval not displayed. Invalid input(s): POCBNP No results for input(s): HGBA1C in the last 72 hours.   Weights: Filed Weights   06/09/17 1708 06/09/17 2155 06/12/17 0507  Weight: 53.5 kg (118 lb) 51.6 kg (113 lb 12.8 oz) 53.4 kg (117 lb 12.8 oz)      Radiology/Studies:  Dg Ribs Unilateral W/chest Right  Result Date: 06/09/2017 CLINICAL DATA:  Patient found on the floor. Confusion. Right-sided pain. EXAM: RIGHT RIBS AND CHEST - 3+ VIEW COMPARISON:  05/10/2017 in previous FINDINGS: Heart size is normal. Mediastinal shadows are normal. Left chest is clear. There is chronic elevation of the right hemidiaphragm with some right effusion in volume loss in the right lower lung. Right lower lung pneumonia not excluded. Rib films on the right do not show a fracture. IMPRESSION: No evidence of rib fracture on the right. Elevated right hemidiaphragm. Small right effusion. Right lower lung density that could be atelectasis and/or pneumonia. Electronically Signed   By: Nelson Chimes M.D.   On: 06/09/2017 18:00   Dg Lumbar Spine 2-3 Views  Result Date: 06/09/2017 CLINICAL DATA:  Found on the floor.  Right-sided pain. EXAM: LUMBAR SPINE - 2-3 VIEW COMPARISON:  05/10/2017 FINDINGS: Curvature convex to the left. 8 mm anterolisthesis L4-5 because of facet arthropathy. Disc space narrowing, most pronounced at L2-3. Newly seen inferior endplate deformity at L1 which could represent a recent fracture. This was not visible on the CT of 05/10/2017. Lower thoracic degenerative changes as seen previously. IMPRESSION: Chronic degenerative changes throughout the lumbar spine. Newly seen inferior endplate deformity at L1 that could represent a recent minor inferior endplate fracture. This was not visible on the recent CT. Electronically Signed   By: Nelson Chimes M.D.   On: 06/09/2017 18:02   Ct Head Wo Contrast  Result Date: 06/09/2017 CLINICAL DATA:  Altered mental status. EXAM: CT HEAD WITHOUT CONTRAST TECHNIQUE: Contiguous axial images were obtained from the base of the skull through the vertex without intravenous contrast. COMPARISON:  CT head dated March 02, 2017. FINDINGS: Brain: No evidence of acute infarction, hemorrhage, hydrocephalus, extra-axial collection or  mass lesion/mass effect. Stable age related cerebral atrophy and chronic microvascular ischemic white matter disease. Unchanged right cerebellar encephalomalacia. Vascular: Atherosclerotic vascular calcification of the carotid siphons. No hyperdense vessel. Skull: Prior right suboccipital craniotomy. Negative for fracture or focal lesion. Sinuses/Orbits: Partial opacification of the left mastoid air cells, improved when compared to prior study. No acute orbital finding. Other: None. IMPRESSION: 1. No acute intracranial abnormality. Stable atrophy and chronic microvascular ischemic white matter disease. 2. Partial opacification of the left mastoid air cells, improved when compared to prior study. Electronically Signed   By: Titus Dubin M.D.   On: 06/09/2017 17:40   Mr Lumbar Spine Wo Contrast  Result Date: 06/11/2017 CLINICAL DATA:  Low back pain radiating into the right  hip and leg for 3 weeks since a fall. EXAM: MRI LUMBAR SPINE WITHOUT CONTRAST TECHNIQUE: Multiplanar, multisequence MR imaging of the lumbar spine was performed. No intravenous contrast was administered. COMPARISON:  MRI lumbar spine 05/19/2016. FINDINGS: Segmentation:  Standard. Alignment: Grade 1 retrolisthesis L3 on L4, L4 on L5 and L5 on S1 due to facet arthropathy is unchanged. Convex left scoliosis with the apex at L2-3 is also unchanged. Vertebrae: The patient has an inferior endplate compression fracture of L1 with vertebral body height loss of up to 40-50%. There is marrow edema in the vertebral body consistent with acute or subacute injury. No other fracture is identified. Scattered degenerative endplate signal change is most notable eccentric to the right at L2-3. Conus medullaris: Extends to the L1 level and appears normal. Paraspinal and other soft tissues: Negative. Disc levels: T9-10, T10-11 and T11-12 are imaged in the sagittal plane only. The central canal and foramina appear open at each level. Small right paracentral  protrusion at T11-12 is noted. T12-L1: Central disc protrusion with some caudal extension is identified and was present on the prior examination. The disc indents the ventral thecal sac but does not compress the conus. Foramina are open. The appearance is not markedly changed. L1-2: The central canal and foramina are widely patent. No bony retropulsion off the inferior endplate of L1. L2-3: Disc space narrowing and a broad-based protrusion more prominent to the right are unchanged. Right worse than left foraminal narrowing is seen. The central canal and right subarticular recess are mildly narrowed. L3-4: Facet degenerative disease and a shallow broad-based disc bulge are identified. Mild central canal and left lateral recess narrowing are unchanged. There is also bilateral foraminal narrowing which appears slightly worse on the left and unchanged. L4-5: The patient has a broad-based disc bulge with a superimposed left lateral recess and foraminal protrusion. Moderate moderately severe central canal stenosis is present with left much worse than right lateral recess and foraminal narrowing. The appearance is not markedly changed. L5-S1: Bilateral facet degenerative disease and a shallow broad-based disc bulge. The central canal is open. Mild to moderate foraminal narrowing is worse on the left. The appearance is unchanged. IMPRESSION: The examination is positive for an acute or subacute inferior endplate compression fracture of L1 with vertebral body height loss of 40-50%. No bony retropulsion. No change in multilevel spondylosis appearing worst at L4-5 as described above. Electronically Signed   By: Inge Rise M.D.   On: 06/11/2017 11:17   Dg Hand 2 View Left  Result Date: 06/09/2017 CLINICAL DATA:  71 year old with current history of rheumatoid arthritis to has had multiple falls recently and was found on the floor of her home earlier today. Left hand pain. Initial encounter. EXAM: LEFT HAND - 2 VIEW  COMPARISON:  No prior hand x-rays.  Left wrist x-rays 09/19/2014. FINDINGS: No evidence of acute fracture. Severe osseous demineralization. Severe deformities at all of the MCP joints related to the rheumatoid arthritis. Prior ORIF of a distal radius fracture with healing. IMPRESSION: 1. No acute osseous abnormality. 2. Deformities involving the MCP joints related to chronic rheumatoid arthritis. 3. Osseous demineralization. Electronically Signed   By: Evangeline Dakin M.D.   On: 06/09/2017 19:18   Ct Hip Right Wo Contrast  Result Date: 06/10/2017 CLINICAL DATA:  History rheumatoid arthritis. Severe fall with right hip pain. EXAM: CT OF THE RIGHT HIP WITHOUT CONTRAST TECHNIQUE: Multidetector CT imaging of the right hip was performed according to the standard protocol. Multiplanar CT image reconstructions  were also generated. COMPARISON:  None. FINDINGS: Bones/Joint/Cartilage No fracture or dislocation. Normal alignment. No joint effusion. Hip joint space is maintained. No lytic or sclerotic osseous lesion. Mild osteoarthritis of the sacroiliac joint. Ligaments Ligaments are suboptimally evaluated by CT. Muscles and Tendons Muscles are normal.  No intramuscular fluid collection or hematoma. Soft tissue No fluid collection or hematoma. No soft tissue mass. Peripheral vascular atherosclerotic disease. IMPRESSION: No acute osseous injury of the right hip. Electronically Signed   By: Kathreen Devoid   On: 06/10/2017 11:38   Dg Hip Unilat W Or Wo Pelvis 2-3 Views Right  Result Date: 06/09/2017 CLINICAL DATA:  Found on the floor.  Right-sided pain. EXAM: DG HIP (WITH OR WITHOUT PELVIS) 2-3V RIGHT COMPARISON:  None. FINDINGS: There is no evidence of hip fracture or dislocation. There is no evidence of arthropathy or other focal bone abnormality. Extensive regional arterial calcification. IMPRESSION: Negative. Electronically Signed   By: Nelson Chimes M.D.   On: 06/09/2017 18:04     Assessment and Recommendation  71  y.o. female with hypertension diabetes chronic obstructive pulmonary disease and hyperlipidemia with recurrent episodes of syncope of unknown etiology with previous thorough workup and no current evidence of congestive heart failure or myocardial infarction 1. Abstain from lisinopril at this time due to concerns of hypotension and side effects 2. Continue metoprolol tartrate 25 mg twice per day for better heart rate control 3. Begin ambulation if able and further intervention as per orthopedics 4. Further consideration of link device for assessment of the syncopal episode caused by rhythm disturbances in the future 5. No other further cardiac diagnostics necessary at this time 6. Patient is currently at low risk for orthopedic procedure if necessary  Signed, Serafina Royals M.D. FACC

## 2017-06-12 NOTE — Progress Notes (Signed)
Pt still complains of 7/10 pain after prn pain medication. MD notified. Orders to increase frequency to every 4 hours. I will continue to assess.

## 2017-06-12 NOTE — Progress Notes (Signed)
Meghan [name] at [place] NAME: Meghan [name]    MR#:  [id number]  DATE OF BIRTH:  November 16, 1945  SUBJECTIVE:   Patient here after a fall and noted to have a L1 compression fracture. Plan for kyphoplasty on Monday. No other acute events overnight. Patient still complaining of back, hip pain.  REVIEW OF SYSTEMS:    Review of Systems  Constitutional: Negative for chills and fever.  HENT: Negative for congestion and tinnitus.   Eyes: Negative for blurred vision and double vision.  Respiratory: Negative for cough, shortness of breath and wheezing.   Cardiovascular: Negative for chest pain, orthopnea and PND.  Gastrointestinal: Negative for abdominal pain, diarrhea, nausea and vomiting.  Genitourinary: Negative for dysuria and hematuria.  Neurological: Negative for dizziness, sensory change and focal weakness.  All other systems reviewed and are negative.   Nutrition: heart Healthy Tolerating Diet: Yes Tolerating PT: Await Eval.   DRUG ALLERGIES:   Allergies  Allergen Reactions  . Gabapentin Other (See Comments)    Pt states that it causes her BP to drop.   . Naproxen Hives  . Nsaids Other (See Comments)    Reaction:  Unknown     VITALS:  Blood pressure 115/77, pulse 88, temperature 98 F (36.7 C), temperature source Oral, resp. rate 18, height 4\' 8"  (1.422 m), weight 53.4 kg (117 lb 12.8 oz), SpO2 (!) 86 %.  PHYSICAL EXAMINATION:   Physical Exam  GENERAL:  71 y.o.-year-old patient lying in bed in no acute distress.  EYES: Pupils equal, round, reactive to light and accommodation. No scleral icterus. Extraocular muscles intact.  HEENT: Head atraumatic, normocephalic. Oropharynx and nasopharynx clear.  NECK:  Supple, no jugular venous distention. No thyroid enlargement, no tenderness.  LUNGS: Normal breath sounds bilaterally, no wheezing, rales, rhonchi. No use of accessory muscles of respiration.  CARDIOVASCULAR: S1, S2 normal. No murmurs,  rubs, or gallops.  ABDOMEN: Soft, nontender, nondistended. Bowel sounds present. No organomegaly or mass.  EXTREMITIES: No cyanosis, clubbing or edema b/l.    NEUROLOGIC: Cranial nerves II through XII are intact. No focal Motor or sensory deficits b/l.   PSYCHIATRIC: The patient is alert and oriented x 3.  SKIN: No obvious rash, lesion, or ulcer.    LABORATORY PANEL:   CBC  Recent Labs Lab 06/09/17 1712  WBC 5.4  HGB 12.8  HCT 38.3  PLT 284   ------------------------------------------------------------------------------------------------------------------  Chemistries   Recent Labs Lab 06/09/17 1712 06/09/17 2336  06/12/17 0648  NA 141  --   < > 138  K 2.8*  --   < > 4.4  CL 108  --   < > 104  CO2 17*  --   < > 27  GLUCOSE 100*  --   < > 114*  BUN 11  --   < > 14  CREATININE 0.53  --   < > 0.43*  CALCIUM 8.1*  --   < > 8.6*  MG  --  1.7  --   --   AST 45*  --   --   --   ALT 25  --   --   --   ALKPHOS 77  --   --   --   BILITOT 1.9*  --   --   --   < > = values in this interval not displayed. ------------------------------------------------------------------------------------------------------------------  Cardiac Enzymes  Recent Labs Lab 06/10/17 2309  TROPONINI 0.08*   ------------------------------------------------------------------------------------------------------------------  RADIOLOGY:  Mr  Lumbar Spine Wo Contrast  Result Date: 06/11/2017 CLINICAL DATA:  Low back pain radiating into the right hip and leg for 3 weeks since a fall. EXAM: MRI LUMBAR SPINE WITHOUT CONTRAST TECHNIQUE: Multiplanar, multisequence MR imaging of the lumbar spine was performed. No intravenous contrast was administered. COMPARISON:  MRI lumbar spine 05/19/2016. FINDINGS: Segmentation:  Standard. Alignment: Grade 1 retrolisthesis L3 on L4, L4 on L5 and L5 on S1 due to facet arthropathy is unchanged. Convex left scoliosis with the apex at L2-3 is also unchanged. Vertebrae: The  patient has an inferior endplate compression fracture of L1 with vertebral body height loss of up to 40-50%. There is marrow edema in the vertebral body consistent with acute or subacute injury. No other fracture is identified. Scattered degenerative endplate signal change is most notable eccentric to the right at L2-3. Conus medullaris: Extends to the L1 level and appears normal. Paraspinal and other soft tissues: Negative. Disc levels: T9-10, T10-11 and T11-12 are imaged in the sagittal plane only. The central canal and foramina appear open at each level. Small right paracentral protrusion at T11-12 is noted. T12-L1: Central disc protrusion with some caudal extension is identified and was present on the prior examination. The disc indents the ventral thecal sac but does not compress the conus. Foramina are open. The appearance is not markedly changed. L1-2: The central canal and foramina are widely patent. No bony retropulsion off the inferior endplate of L1. L2-3: Disc space narrowing and a broad-based protrusion more prominent to the right are unchanged. Right worse than left foraminal narrowing is seen. The central canal and right subarticular recess are mildly narrowed. L3-4: Facet degenerative disease and a shallow broad-based disc bulge are identified. Mild central canal and left lateral recess narrowing are unchanged. There is also bilateral foraminal narrowing which appears slightly worse on the left and unchanged. L4-5: The patient has a broad-based disc bulge with a superimposed left lateral recess and foraminal protrusion. Moderate moderately severe central canal stenosis is present with left much worse than right lateral recess and foraminal narrowing. The appearance is not markedly changed. L5-S1: Bilateral facet degenerative disease and a shallow broad-based disc bulge. The central canal is open. Mild to moderate foraminal narrowing is worse on the left. The appearance is unchanged. IMPRESSION: The  examination is positive for an acute or subacute inferior endplate compression fracture of L1 with vertebral body height loss of 40-50%. No bony retropulsion. No change in multilevel spondylosis appearing worst at L4-5 as described above. Electronically Signed   By: Inge Rise M.D.   On: 06/11/2017 11:17     ASSESSMENT AND PLAN:   71 year old female with history of essential hypertension, hyperlipidemia and chronic diastolic heart failure who presents with syncope.  1. Mechanical fall: Patient has been seen and evaluated by cardiology for possible syncope however it does not appear the patient has had a syncopal episode.  -Patient has had extensive workup for syncope which has been negative. Seen by cardiology and as per them plan for possible placement of a link device for assessment of syncope in the near future. -Await physical therapy evaluation post-kyphoplasty on Monday.  2. Back pain/right hip pain-patient's CT of the hip is negative for any fracture. MRI of the lumbar spine confirmed L1 compression fracture. -Appreciate orthopedics input and plan for kyphoplasty on Monday.  3. Mild rhabdomyolysis after mechanical fall: CK's have improved with IV fluids  4. Hypokalemia: resolved w/ supplementation.   5. Diabetes: BS stable and cont. SSI.  -  appreciate Diabetes Coordinator Input.   6. Essential hypertension: cont. Metoprolol.   7. Chronic diastolic heart failure without signs of exacerbation: Continue Lasix, Metoprolol.   9. Depression/anxiety: Continue BuSpar/Lamotrigine/Remeron   All the records are reviewed and case discussed with Care Management/Social Worker. Management plans discussed with the patient, family and they are in agreement.  CODE STATUS: DNR  DVT Prophylaxis: Lovenox  TOTAL TIME TAKING CARE OF THIS PATIENT: 30 minutes.   POSSIBLE D/C IN 2-3 DAYS, DEPENDING ON CLINICAL CONDITION.   Henreitta Leber M.D on 06/12/2017 at 12:09 PM  Between 7am  to 6pm - Pager - [phone]  After 6pm go to [email or web address] - password [email or web address] [facility]  Office  [phone]  CC: Primary care physician; Lavera Guise, MD

## 2017-06-12 NOTE — NC FL2 (Signed)
[place] LEVEL OF CARE SCREENING TOOL     IDENTIFICATION  [name] Birthdate: 1946-02-24 Sex: female Admission Date (Current Location): 06/09/2017  [place] and [place] Number:  Engineering geologist and Address:  [facility], [address], [place], [place] [address]      Provider Number: [id number]  Attending Physician Name and Address:  Henreitta Leber, MD  Relative Name and Phone Number:  [name] (Granddaughter) [phone]    Current Level of Care: Hospital Recommended Level of Care: [place] Prior Approval Number:    Date Approved/Denied:   PASRR Number:    Discharge Plan: SNF    Current Diagnoses: Patient Active Problem List   Diagnosis Date Noted  . Syncope 05/05/2017  . Acute on chronic systolic CHF (congestive heart failure) ([place]) 04/16/2017  . Acute lower UTI 04/16/2017  . Acute respiratory failure ([place]) 04/16/2017  . Pressure injury of skin 03/22/2017  . HCAP (healthcare-associated pneumonia) 03/20/2017  . Acute on chronic respiratory failure with hypoxia ([place]) 03/20/2017  . Acute respiratory failure with hypoxia ([place]) 03/20/2017  . Near syncope 02/27/2017  . Dehydration 02/27/2017  . Colitis 02/22/2017  . Seizure ([place]) 01/03/2016  . Chronic tension-type headache, intractable 12/04/2015  . 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 Degeneration of intervertebral disc of lumbar region 07/15/2015  . Seropositive rheumatoid arthritis ([place]) 07/15/2015  . Asthma with acute exacerbation 03/10/2015  . Hypokalemia 03/01/2015  . Hyponatremia 03/01/2015  . DDD (degenerative disc disease), lumbar 01/31/2015  . Arthritis, degenerative 01/31/2015  . Rheumatoid arthritis with rheumatoid factor ([place]) 01/31/2015  . HTN (hypertension) 12/24/2014  . Sepsis ([place]) 12/24/2014  . Left knee pain 12/24/2014  . GERD (gastroesophageal reflux disease) 12/24/2014  . COPD (chronic obstructive pulmonary  disease) ([place]) 12/24/2014  . Depression 12/24/2014  . Anxiety 12/24/2014  . Severe bipolar disorder with psychotic features, mood-congruent ([place]) 11/16/2014  . Neurosis, posttraumatic 11/16/2014  . H/O gastric ulcer 11/16/2014  . Barton's fracture of distal radius, closed 09/19/2014  . Neuritis or radiculitis due to rupture of lumbar intervertebral disc 05/11/2014  . Cervico-occipital neuralgia 03/26/2014  . Difficulty in walking 03/26/2014  . Difficulty in walking, not elsewhere classified 03/26/2014  . Cervical spine syndrome 01/26/2014  . Cephalalgia 01/09/2014  . Disordered sleep 01/09/2014  . BP (high blood pressure) 10/19/2013  . Adiposity 10/19/2013  . Cardiac murmur 10/19/2013  . PNA (pneumonia) 10/19/2013  . Breath shortness 10/19/2013  . Chronic obstructive pulmonary disease ([place]) 10/19/2013  . Diabetes mellitus ([place]) 10/19/2013    Orientation RESPIRATION BLADDER Height & Weight     Self, Time, Situation, Place  Normal Incontinent Weight: 117 lb 12.8 oz (53.4 kg) Height:  4\' 8"  (142.2 cm)  BEHAVIORAL SYMPTOMS/MOOD NEUROLOGICAL BOWEL NUTRITION STATUS      Continent Diet (Heart healthy, carb modified)  AMBULATORY STATUS COMMUNICATION OF NEEDS Skin   Extensive Assist Verbally Normal                       Personal Care Assistance Level of Assistance  Bathing, Feeding, Dressing Bathing Assistance: Limited assistance Feeding assistance: Independent Dressing Assistance: Limited assistance     Functional Limitations Info             SPECIAL CARE FACTORS FREQUENCY  PT (By licensed PT)     PT Frequency: Up to 5X per day, 5 days per week              Contractures  Contractures Info: Not present    Additional Factors Info  Code Status, Allergies, Psychotropic, Insulin Sliding Scale, Isolation Precautions Code Status Info: DNR Allergies Info: Gabapentin, Naproxen, Nsaids Psychotropic Info: Buspar, Lamictal, Remeron, Lexapro Insulin Sliding Scale Info:  Novolog 0-5 units qhs Isolation Precautions Info: Contact precautions (MRSA)     Current Medications (06/12/2017):  This is the current hospital active medication list Current Facility-Administered Medications  Medication Dose Route Frequency Provider Last Rate Last Dose  . acetaminophen (TYLENOL) tablet 650 mg  650 mg Oral Q6H PRN Demetrios Loll, MD   650 mg at 06/11/17 1211   Or  . acetaminophen (TYLENOL) suppository 650 mg  650 mg Rectal Q6H PRN Demetrios Loll, MD      . albuterol (PROVENTIL) (2.5 MG/3ML) 0.083% nebulizer solution 2.5 mg  2.5 mg Nebulization Q2H PRN Demetrios Loll, MD      . bisacodyl (DULCOLAX) EC tablet 5 mg  5 mg Oral Daily PRN Demetrios Loll, MD      . busPIRone (BUSPAR) tablet 7.5 mg  7.5 mg Oral BID Demetrios Loll, MD   7.5 mg at 06/12/17 0911  . ceFAZolin (ANCEF) 1,000 mg in dextrose 5 % 50 mL IVPB  1,000 mg Intravenous On Call to [place], MD      . Chlorhexidine Gluconate Cloth 2 % PADS 6 each  6 each Topical Q0600 Demetrios Loll, MD   6 each at 06/11/17 [phone]  . enoxaparin (LOVENOX) injection 40 mg  40 mg Subcutaneous Q24H Hessie Knows, MD   40 mg at 06/11/17 2142  . feeding supplement (ENSURE ENLIVE) (ENSURE ENLIVE) liquid 237 mL  237 mL Oral BID BM Demetrios Loll, MD   237 mL at 06/12/17 1012  . furosemide (LASIX) tablet 20 mg  20 mg Oral Daily Demetrios Loll, MD   20 mg at 06/12/17 1008  . Influenza vac split quadrivalent PF (FLUZONE HIGH-DOSE) injection 0.5 mL  0.5 mL Intramuscular Tomorrow-1000 Demetrios Loll, MD      . insulin aspart (novoLOG) injection 0-5 Units  0-5 Units Subcutaneous QHS Demetrios Loll, MD      . insulin aspart (novoLOG) injection 0-9 Units  0-9 Units Subcutaneous TID WC Demetrios Loll, MD   2 Units at 06/12/17 1215  . lamoTRIgine (LAMICTAL) tablet 100 mg  100 mg Oral BID Demetrios Loll, MD   100 mg at 06/12/17 1008  . loperamide (IMODIUM) capsule 2 mg  2 mg Oral QID PRN Demetrios Loll, MD      . metoprolol tartrate (LOPRESSOR) tablet 25 mg  25 mg Oral BID Corey Skains, MD   25  mg at 06/12/17 1008  . mirtazapine (REMERON) tablet 7.5 mg  7.5 mg Oral QHS Demetrios Loll, MD   7.5 mg at 06/11/17 2200  . montelukast (SINGULAIR) tablet 10 mg  10 mg Oral Daily Demetrios Loll, MD   10 mg at 06/12/17 1009  . mupirocin ointment (BACTROBAN) 2 % 1 application  1 application Nasal BID Demetrios Loll, MD   1 application at 69/48/54 1012  . ondansetron (ZOFRAN) tablet 4 mg  4 mg Oral Q6H PRN Demetrios Loll, MD       Or  . ondansetron [facility]) injection 4 mg  4 mg Intravenous Q6H PRN Demetrios Loll, MD   4 mg at 06/09/17 2233  . oxyCODONE-acetaminophen (PERCOCET/ROXICET) 5-325 MG per tablet 1 tablet  1 tablet Oral Q4H PRN Henreitta Leber, MD   1 tablet at 06/12/17 1211  . pantoprazole (PROTONIX) EC tablet 40 mg  40 mg Oral Daily Demetrios Loll, MD   40 mg at 06/12/17 1008  . potassium chloride SA (K-DUR,KLOR-CON) CR tablet 20 mEq  20 mEq Oral BID Demetrios Loll, MD   20 mEq at 06/12/17 0910  . pramipexole (MIRAPEX) tablet 0.25 mg  0.25 mg Oral Daily Demetrios Loll, MD   0.25 mg at 06/12/17 1009  . predniSONE (DELTASONE) tablet 5 mg  5 mg Oral Q breakfast Demetrios Loll, MD   5 mg at 06/12/17 0910  . pregabalin (LYRICA) capsule 50 mg  50 mg Oral BID Demetrios Loll, MD   50 mg at 06/12/17 1009  . senna-docusate (Senokot-S) tablet 1 tablet  1 tablet Oral QHS PRN Demetrios Loll, MD      . topiramate (TOPAMAX) tablet 25 mg  25 mg Oral QHS Demetrios Loll, MD   25 mg at 06/11/17 2141     Discharge Medications: Please see discharge summary for a list of discharge medications.  Relevant Imaging Results:  Relevant Lab Results:   Additional Information SS# [id number]  Zettie Pho, LCSW

## 2017-06-12 NOTE — Progress Notes (Signed)
Reviewed spine model with patient, plan kyphoplasty 11/5, will stop Lovenox after tonight's dose. Will resume Plavix and Lovenox post procedure.  Site marked.

## 2017-06-12 NOTE — Progress Notes (Signed)
Pt complains of nausea, no emesis. Prn zofran given. I will continue to assess.

## 2017-06-13 ENCOUNTER — Encounter: Payer: Self-pay | Admitting: Anesthesiology

## 2017-06-13 LAB — URINALYSIS, COMPLETE (UACMP) WITH MICROSCOPIC
BACTERIA UA: NONE SEEN
Bilirubin Urine: NEGATIVE
GLUCOSE, UA: NEGATIVE mg/dL
Hgb urine dipstick: NEGATIVE
KETONES UR: NEGATIVE mg/dL
Leukocytes, UA: NEGATIVE
NITRITE: NEGATIVE
Protein, ur: NEGATIVE mg/dL
Specific Gravity, Urine: 1.01 (ref 1.005–1.030)
pH: 7 (ref 5.0–8.0)

## 2017-06-13 LAB — GLUCOSE, CAPILLARY
GLUCOSE-CAPILLARY: 130 mg/dL — AB (ref 65–99)
GLUCOSE-CAPILLARY: 131 mg/dL — AB (ref 65–99)
Glucose-Capillary: 186 mg/dL — ABNORMAL HIGH (ref 65–99)
Glucose-Capillary: 185 mg/dL — ABNORMAL HIGH (ref 65–99)

## 2017-06-13 NOTE — Anesthesia Preprocedure Evaluation (Addendum)
Anesthesia Evaluation  Patient identified by MRN, date of birth, ID band Patient awake    Reviewed: Allergy & Precautions, NPO status , Patient's Chart, lab work & pertinent test results, reviewed documented beta blocker date and time   Airway Mallampati: III  TM Distance: >3 FB     Dental  (+) Chipped, Upper Dentures, Partial Lower, Poor Dentition   Pulmonary shortness of breath and with exertion, asthma , pneumonia, resolved, COPD,           Cardiovascular hypertension, Pt. on medications + angina + CAD and +CHF  + Valvular Problems/Murmurs      Neuro/Psych  Headaches, Seizures -,  PSYCHIATRIC DISORDERS Anxiety Depression Bipolar Disorder  Neuromuscular disease    GI/Hepatic Neg liver ROS, GERD  ,  Endo/Other  diabetes, Type 2  Renal/GU negative Renal ROS     Musculoskeletal  (+) Arthritis , Osteoarthritis and Rheumatoid disorders,    Abdominal   Peds negative pediatric ROS (+)  Hematology   Anesthesia Other Findings Neck movement OK. Past Medical History: No date: Anginal pain ([place]) No date: Anxiety No date: Arthritis     Comment:  RA No date: Asthma No date: Brain tumor (benign) (HCC) No date: CHF (congestive heart failure) (HCC) No date: Collagen vascular disease (HCC) No date: COPD (chronic obstructive pulmonary disease) (HCC) No date: Coronary artery disease No date: DDD (degenerative disc disease) No date: Depression No date: Diabetes mellitus No date: GERD (gastroesophageal reflux disease) No date: Headache No date: Heart murmur No date: Heart murmur No date: Hypercholesteremia No date: Hypertension No date: Lumbar degenerative disc disease No date: Migraines No date: Obese No date: Obesity No date: Osteopenia No date: Osteoporosis No date: Pneumonia No date: PTSD (post-traumatic stress disorder)  Reproductive/Obstetrics                             Anesthesia  Physical  Anesthesia Plan  ASA: III  Anesthesia Plan: MAC   Post-op Pain Management:    Induction:   PONV Risk Score and Plan: 3  Airway Management Planned: Nasal Cannula  Additional Equipment:   Intra-op Plan:   Post-operative Plan:   Informed Consent: I have reviewed the patients History and Physical, chart, labs and discussed the procedure including the risks, benefits and alternatives for the proposed anesthesia with the patient or authorized representative who has indicated his/her understanding and acceptance.     Plan Discussed with: CRNA  Anesthesia Plan Comments:        Anesthesia Quick Evaluation

## 2017-06-13 NOTE — Progress Notes (Signed)
Pts confusion improved, u/a negative. I will continue to assess.

## 2017-06-13 NOTE — Progress Notes (Signed)
[facility] Encounter Note  Patient: Meghan [name] / Admit Date: 06/09/2017 / Date of Encounter: 06/13/2017, 9:18 AM   Subjective: Patient has significant hip and lower back pain due to her recent mechanical fall and fracture. The patient has been hemodynamically stable but heart rate of normal sinus rhythm and now with control due to metoprolol use No evidence of congestive heart failure or anginal symptoms. No further episodes of syncope  Review of Systems: Positive for: Low back pain Negative for: Vision change, hearing change, syncope, dizziness, nausea, vomiting,diarrhea, bloody stool, stomach pain, cough, congestion, diaphoresis, urinary frequency, urinary pain,skin lesions, skin rashes Others previously listed  Objective: Telemetry: Normal sinus rhythm Physical Exam: Blood pressure 101/66, pulse 92, temperature 97.9 F (36.6 C), resp. rate 16, height 4\' 8"  (1.422 m), weight 51.4 kg (113 lb 6.4 oz), SpO2 96 %. Body mass index is 25.42 kg/m. General: Well developed, well nourished, in no acute distress. Head: Normocephalic, atraumatic, sclera non-icteric, no xanthomas, nares are without discharge. Neck: No apparent masses Lungs: Normal respirations with no wheezes, no rhonchi, no rales , no crackles   Heart: Regular rate and rhythm, normal S1 S2, no murmur, no rub, no gallop, PMI is normal size and placement, carotid upstroke normal without bruit, jugular venous pressure normal Abdomen: Soft, non-tender, non-distended with normoactive bowel sounds. No hepatosplenomegaly. Abdominal aorta is normal size without bruit Extremities: No edema, no clubbing, no cyanosis, no ulcers,  Peripheral: 2+ radial, 2+ femoral, 2+ dorsal pedal pulses Neuro: Alert and oriented. Moves all extremities spontaneously. Psych:  Responds to questions appropriately with a normal affect.   Intake/Output Summary (Last 24 hours) at 06/13/2017 0918 Last data filed at 06/13/2017 0913 Gross  per 24 hour  Intake -  Output 1000 ml  Net -1000 ml    Inpatient Medications:  . busPIRone  7.5 mg Oral BID  . Chlorhexidine Gluconate Cloth  6 each Topical Q0600  . feeding supplement (ENSURE ENLIVE)  237 mL Oral BID BM  . furosemide  20 mg Oral Daily  . Influenza vac split quadrivalent PF  0.5 mL Intramuscular Tomorrow-1000  . insulin aspart  0-5 Units Subcutaneous QHS  . insulin aspart  0-9 Units Subcutaneous TID WC  . lamoTRIgine  100 mg Oral BID  . metoprolol tartrate  25 mg Oral BID  . mirtazapine  7.5 mg Oral QHS  . montelukast  10 mg Oral Daily  . mupirocin ointment  1 application Nasal BID  . pantoprazole  40 mg Oral Daily  . potassium chloride  20 mEq Oral BID  . pramipexole  0.25 mg Oral Daily  . predniSONE  5 mg Oral Q breakfast  . pregabalin  50 mg Oral BID  . sodium chloride flush  3 mL Intravenous Q12H  . topiramate  25 mg Oral QHS   Infusions:   Labs: Recent Labs    06/12/17 0648  NA 138  K 4.4  CL 104  CO2 27  GLUCOSE 114*  BUN 14  CREATININE 0.43*  CALCIUM 8.6*   No results for input(s): AST, ALT, ALKPHOS, BILITOT, PROT, ALBUMIN in the last 72 hours. No results for input(s): WBC, NEUTROABS, HGB, HCT, MCV, PLT in the last 72 hours. Recent Labs    06/10/17 1026 06/10/17 1612 06/10/17 2309  TROPONINI 0.07* 0.06* 0.08*   Invalid input(s): POCBNP No results for input(s): HGBA1C in the last 72 hours.   Weights: Filed Weights   06/09/17 2155 06/12/17 0507 06/13/17 0418  Weight: 51.6  kg (113 lb 12.8 oz) 53.4 kg (117 lb 12.8 oz) 51.4 kg (113 lb 6.4 oz)     Radiology/Studies:  Dg Ribs Unilateral W/chest Right  Result Date: 06/09/2017 CLINICAL DATA:  Patient found on the floor. Confusion. Right-sided pain. EXAM: RIGHT RIBS AND CHEST - 3+ VIEW COMPARISON:  05/10/2017 in previous FINDINGS: Heart size is normal. Mediastinal shadows are normal. Left chest is clear. There is chronic elevation of the right hemidiaphragm with some right effusion in  volume loss in the right lower lung. Right lower lung pneumonia not excluded. Rib films on the right do not show a fracture. IMPRESSION: No evidence of rib fracture on the right. Elevated right hemidiaphragm. Small right effusion. Right lower lung density that could be atelectasis and/or pneumonia. Electronically Signed   By: Nelson Chimes M.D.   On: 06/09/2017 18:00   Dg Lumbar Spine 2-3 Views  Result Date: 06/09/2017 CLINICAL DATA:  Found on the floor.  Right-sided pain. EXAM: LUMBAR SPINE - 2-3 VIEW COMPARISON:  05/10/2017 FINDINGS: Curvature convex to the left. 8 mm anterolisthesis L4-5 because of facet arthropathy. Disc space narrowing, most pronounced at L2-3. Newly seen inferior endplate deformity at L1 which could represent a recent fracture. This was not visible on the CT of 05/10/2017. Lower thoracic degenerative changes as seen previously. IMPRESSION: Chronic degenerative changes throughout the lumbar spine. Newly seen inferior endplate deformity at L1 that could represent a recent minor inferior endplate fracture. This was not visible on the recent CT. Electronically Signed   By: Nelson Chimes M.D.   On: 06/09/2017 18:02   Ct Head Wo Contrast  Result Date: 06/09/2017 CLINICAL DATA:  Altered mental status. EXAM: CT HEAD WITHOUT CONTRAST TECHNIQUE: Contiguous axial images were obtained from the base of the skull through the vertex without intravenous contrast. COMPARISON:  CT head dated March 02, 2017. FINDINGS: Brain: No evidence of acute infarction, hemorrhage, hydrocephalus, extra-axial collection or mass lesion/mass effect. Stable age related cerebral atrophy and chronic microvascular ischemic white matter disease. Unchanged right cerebellar encephalomalacia. Vascular: Atherosclerotic vascular calcification of the carotid siphons. No hyperdense vessel. Skull: Prior right suboccipital craniotomy. Negative for fracture or focal lesion. Sinuses/Orbits: Partial opacification of the left mastoid air  cells, improved when compared to prior study. No acute orbital finding. Other: None. IMPRESSION: 1. No acute intracranial abnormality. Stable atrophy and chronic microvascular ischemic white matter disease. 2. Partial opacification of the left mastoid air cells, improved when compared to prior study. Electronically Signed   By: Titus Dubin M.D.   On: 06/09/2017 17:40   Mr Lumbar Spine Wo Contrast  Result Date: 06/11/2017 CLINICAL DATA:  Low back pain radiating into the right hip and leg for 3 weeks since a fall. EXAM: MRI LUMBAR SPINE WITHOUT CONTRAST TECHNIQUE: Multiplanar, multisequence MR imaging of the lumbar spine was performed. No intravenous contrast was administered. COMPARISON:  MRI lumbar spine 05/19/2016. FINDINGS: Segmentation:  Standard. Alignment: Grade 1 retrolisthesis L3 on L4, L4 on L5 and L5 on S1 due to facet arthropathy is unchanged. Convex left scoliosis with the apex at L2-3 is also unchanged. Vertebrae: The patient has an inferior endplate compression fracture of L1 with vertebral body height loss of up to 40-50%. There is marrow edema in the vertebral body consistent with acute or subacute injury. No other fracture is identified. Scattered degenerative endplate signal change is most notable eccentric to the right at L2-3. Conus medullaris: Extends to the L1 level and appears normal. Paraspinal and other soft tissues: Negative. Disc  levels: T9-10, T10-11 and T11-12 are imaged in the sagittal plane only. The central canal and foramina appear open at each level. Small right paracentral protrusion at T11-12 is noted. T12-L1: Central disc protrusion with some caudal extension is identified and was present on the prior examination. The disc indents the ventral thecal sac but does not compress the conus. Foramina are open. The appearance is not markedly changed. L1-2: The central canal and foramina are widely patent. No bony retropulsion off the inferior endplate of L1. L2-3: Disc space  narrowing and a broad-based protrusion more prominent to the right are unchanged. Right worse than left foraminal narrowing is seen. The central canal and right subarticular recess are mildly narrowed. L3-4: Facet degenerative disease and a shallow broad-based disc bulge are identified. Mild central canal and left lateral recess narrowing are unchanged. There is also bilateral foraminal narrowing which appears slightly worse on the left and unchanged. L4-5: The patient has a broad-based disc bulge with a superimposed left lateral recess and foraminal protrusion. Moderate moderately severe central canal stenosis is present with left much worse than right lateral recess and foraminal narrowing. The appearance is not markedly changed. L5-S1: Bilateral facet degenerative disease and a shallow broad-based disc bulge. The central canal is open. Mild to moderate foraminal narrowing is worse on the left. The appearance is unchanged. IMPRESSION: The examination is positive for an acute or subacute inferior endplate compression fracture of L1 with vertebral body height loss of 40-50%. No bony retropulsion. No change in multilevel spondylosis appearing worst at L4-5 as described above. Electronically Signed   By: Inge Rise M.D.   On: 06/11/2017 11:17   Dg Hand 2 View Left  Result Date: 06/09/2017 CLINICAL DATA:  71 year old with current history of rheumatoid arthritis to has had multiple falls recently and was found on the floor of her home earlier today. Left hand pain. Initial encounter. EXAM: LEFT HAND - 2 VIEW COMPARISON:  No prior hand x-rays.  Left wrist x-rays 09/19/2014. FINDINGS: No evidence of acute fracture. Severe osseous demineralization. Severe deformities at all of the MCP joints related to the rheumatoid arthritis. Prior ORIF of a distal radius fracture with healing. IMPRESSION: 1. No acute osseous abnormality. 2. Deformities involving the MCP joints related to chronic rheumatoid arthritis. 3.  Osseous demineralization. Electronically Signed   By: Evangeline Dakin M.D.   On: 06/09/2017 19:18   Ct Hip Right Wo Contrast  Result Date: 06/10/2017 CLINICAL DATA:  History rheumatoid arthritis. Severe fall with right hip pain. EXAM: CT OF THE RIGHT HIP WITHOUT CONTRAST TECHNIQUE: Multidetector CT imaging of the right hip was performed according to the standard protocol. Multiplanar CT image reconstructions were also generated. COMPARISON:  None. FINDINGS: Bones/Joint/Cartilage No fracture or dislocation. Normal alignment. No joint effusion. Hip joint space is maintained. No lytic or sclerotic osseous lesion. Mild osteoarthritis of the sacroiliac joint. Ligaments Ligaments are suboptimally evaluated by CT. Muscles and Tendons Muscles are normal.  No intramuscular fluid collection or hematoma. Soft tissue No fluid collection or hematoma. No soft tissue mass. Peripheral vascular atherosclerotic disease. IMPRESSION: No acute osseous injury of the right hip. Electronically Signed   By: Kathreen Devoid   On: 06/10/2017 11:38   Dg Hip Unilat W Or Wo Pelvis 2-3 Views Right  Result Date: 06/09/2017 CLINICAL DATA:  Found on the floor.  Right-sided pain. EXAM: DG HIP (WITH OR WITHOUT PELVIS) 2-3V RIGHT COMPARISON:  None. FINDINGS: There is no evidence of hip fracture or dislocation. There is no  evidence of arthropathy or other focal bone abnormality. Extensive regional arterial calcification. IMPRESSION: Negative. Electronically Signed   By: Nelson Chimes M.D.   On: 06/09/2017 18:04     Assessment and Recommendation  71 y.o. female with hypertension diabetes chronic obstructive pulmonary disease and hyperlipidemia with recurrent episodes of syncope of unknown etiology with previous thorough workup and no current evidence of congestive heart failure or myocardial infarction 1. Continue to Abstain from lisinopril at this time due to concerns of hypotension and side effects 2. Continue metoprolol tartrate 25 mg  twice per day for better heart rate control 3. Begin ambulation if able and further intervention as per orthopedics 4. Further consideration of link device for assessment of the syncopal episode caused by rhythm disturbances in the future if necessary 5. No other further cardiac diagnostics necessary at this time 6. Patient is currently at low risk for orthopedic procedure if necessary on Monday 7. Call if further questions  Signed, Serafina Royals M.D. FACC

## 2017-06-13 NOTE — Progress Notes (Signed)
[place] at [place] NAME: Meghan [name]    MR#:  [id number]  DATE OF BIRTH:  April 05, 1946  SUBJECTIVE:   Patient here after a fall and noted to have a L1 compression fracture. Plan for kyphoplasty on Monday. No other acute events overnight. Patient still complaining of back, hip pain.  REVIEW OF SYSTEMS:    Review of Systems  Constitutional: Negative for chills and fever.  HENT: Negative for congestion and tinnitus.   Eyes: Negative for blurred vision and double vision.  Respiratory: Negative for cough, shortness of breath and wheezing.   Cardiovascular: Negative for chest pain, orthopnea and PND.  Gastrointestinal: Negative for abdominal pain, diarrhea, nausea and vomiting.  Genitourinary: Negative for dysuria and hematuria.  Musculoskeletal: Positive for back pain.  Neurological: Negative for dizziness, sensory change and focal weakness.  All other systems reviewed and are negative.   Nutrition: heart Healthy Tolerating Diet: Yes Tolerating PT: Await Eval.   DRUG ALLERGIES:   Allergies  Allergen Reactions  . Gabapentin Other (See Comments)    Pt states that it causes her BP to drop.   . Naproxen Hives  . Nsaids Other (See Comments)    Reaction:  Unknown     VITALS:  Blood pressure 101/66, pulse 92, temperature 97.9 F (36.6 C), resp. rate 16, height 4\' 8"  (1.422 m), weight 51.4 kg (113 lb 6.4 oz), SpO2 96 %.  PHYSICAL EXAMINATION:   Physical Exam  GENERAL:  71 y.o.-year-old patient lying in bed in no acute distress.  EYES: Pupils equal, round, reactive to light and accommodation. No scleral icterus. Extraocular muscles intact.  HEENT: Head atraumatic, normocephalic. Oropharynx and nasopharynx clear.  NECK:  Supple, no jugular venous distention. No thyroid enlargement, no tenderness.  LUNGS: Normal breath sounds bilaterally, no wheezing, rales, rhonchi. No use of accessory muscles of respiration.  CARDIOVASCULAR: S1, S2  normal. No murmurs, rubs, or gallops.  ABDOMEN: Soft, nontender, nondistended. Bowel sounds present. No organomegaly or mass.  EXTREMITIES: No cyanosis, clubbing or edema b/l.    NEUROLOGIC: Cranial nerves II through XII are intact. No focal Motor or sensory deficits b/l.   PSYCHIATRIC: The patient is alert and oriented x 3.  SKIN: No obvious rash, lesion, or ulcer.    LABORATORY PANEL:   CBC Recent Labs  Lab 06/09/17 1712  WBC 5.4  HGB 12.8  HCT 38.3  PLT 284   ------------------------------------------------------------------------------------------------------------------  Chemistries  Recent Labs  Lab 06/09/17 1712 06/09/17 2336  06/12/17 0648  NA 141  --    < > 138  K 2.8*  --    < > 4.4  CL 108  --    < > 104  CO2 17*  --    < > 27  GLUCOSE 100*  --    < > 114*  BUN 11  --    < > 14  CREATININE 0.53  --    < > 0.43*  CALCIUM 8.1*  --    < > 8.6*  MG  --  1.7  --   --   AST 45*  --   --   --   ALT 25  --   --   --   ALKPHOS 77  --   --   --   BILITOT 1.9*  --   --   --    < > = values in this interval not displayed.   ------------------------------------------------------------------------------------------------------------------  Cardiac Enzymes Recent Labs  Lab 06/10/17 2309  TROPONINI 0.08*   ------------------------------------------------------------------------------------------------------------------  RADIOLOGY:  No results found.   ASSESSMENT AND PLAN:   71 year old female with history of essential hypertension, hyperlipidemia and chronic diastolic heart failure who presents with syncope.  1. Mechanical fall: Patient has been seen and evaluated by cardiology for possible syncope however it does not appear the patient has had a syncopal episode.  -Patient has had extensive workup for syncope which has been negative. Seen by cardiology and as per them plan for possible placement of a link device for assessment of syncope in the near  future. -Await physical therapy evaluation post-kyphoplasty on Monday.  2. Back pain/right hip pain-patient's CT of the hip is negative for any fracture. MRI of the lumbar spine confirmed L1 compression fracture. -Appreciate orthopedics input and plan for Kyphoplasty tomorrow.   3. Mild rhabdomyolysis after mechanical fall: CK's have improved with IV fluids  4. Hypokalemia: resolved w/ supplementation.   5. Diabetes: BS stable and cont. SSI.  - appreciate Diabetes Coordinator Input.   6. Essential hypertension: cont. Metoprolol.   7. Chronic diastolic heart failure without signs of exacerbation: Continue Lasix, Metoprolol.   9. Depression/anxiety: Continue BuSpar/Lamotrigine/Remeron   All the records are reviewed and case discussed with Care Management/Social Worker. Management plans discussed with the patient, family and they are in agreement.  CODE STATUS: DNR  DVT Prophylaxis: Lovenox  TOTAL TIME TAKING CARE OF THIS PATIENT: 25 minutes.   POSSIBLE D/C IN 2-3 DAYS, DEPENDING ON CLINICAL CONDITION.   Henreitta Leber M.D on 06/13/2017 at 1:40 PM  Between 7am to 6pm - Pager - [phone]  After 6pm go to [email or web address] - password [email or web address] [facility]  Office  [phone]  CC: Primary care physician; Lavera Guise, MD

## 2017-06-13 NOTE — Plan of Care (Signed)
  Education: Knowledge of [place] Education information/materials will improve 06/13/2017 2357 - Progressing by Larina Bras, RN   Safety: Ability to remain free from injury will improve 06/13/2017 2357 - Progressing by Larina Bras, RN   Pain Managment: General experience of comfort will improve 06/13/2017 2357 - Progressing by Larina Bras, RN   Skin Integrity: Risk for impaired skin integrity will decrease 06/13/2017 2357 - Progressing by Larina Bras, RN   Tissue Perfusion: Risk factors for ineffective tissue perfusion will decrease 06/13/2017 2357 - Progressing by Larina Bras, RN   Activity: Risk for activity intolerance will decrease 06/13/2017 2357 - Progressing by Larina Bras, RN   Bowel/Gastric: Will not experience complications related to bowel motility 06/13/2017 2357 - Progressing by Larina Bras, RN

## 2017-06-13 NOTE — Progress Notes (Signed)
A&O with periods of confusion. Up with one assist to [facility]. No complaints of pain. No S/S distress. For kyphoplasty on Monday.

## 2017-06-13 NOTE — Progress Notes (Signed)
Pt having intermittent confusion today more so than yesterday. MD notified. Orders to repeat urinalysis received. I will continue to assess.

## 2017-06-13 NOTE — Clinical Social Work Note (Signed)
Clinical Social Work Assessment  Patient Details  Name: TONGELA ENCINAS MRN: [id number] Date of Birth: 1945/12/02  Date of referral:  06/13/17               Reason for consult:  Facility Placement                Permission sought to share information with:  Facility Art therapist granted to share information::  Yes, Verbal Permission Granted  Name::        Agency::     Relationship::     Contact Information:     Housing/Transportation Living arrangements for the past 2 months:  Apartment Source of Information:  Patient, Medical Team Patient Interpreter Needed:  None Criminal Activity/Legal Involvement Pertinent to Current Situation/Hospitalization:  No - Comment as needed Significant Relationships:  Other Family Members(Grandchildren) Lives with:  Self Do you feel safe going back to the place where you live?  Yes Need for family participation in patient care:  No (Coment)  Care giving concerns: PT recommendation for STR   Social Worker assessment / plan:  CSW met with the patient at bedside to discuss discharge planning. The patient gave verbal permission to conduct the referral process for SNF, and she indicated the [facility] is her primary choice. The facility has made a bed offer which has been accepted and selected in the [place].  The patient will have a kyphoplasty on 11/5. In the meantime, the patient has level II [name] which will require a change in status as it has expired. The CSW has sent all needed documentation to [place] [facility] to begin this process. The patient cannot discharge without this updated [name]. The CSW will continue to follow to facilitate discharge.  Employment status:  Retired Nurse, adult PT Recommendations:  [place] / Referral to community resources:  [place]  Patient/Family's Response to care:  The patient thanked the CSW for  assistance.  Patient/Family's Understanding of and Emotional Response to Diagnosis, Current Treatment, and Prognosis:  The patient understands the need for SNF level of care and is in agreement.  Emotional Assessment Appearance:  Appears older than stated age Attitude/Demeanor/Rapport:  Other(Pleasant, alert, cooperative) Affect (typically observed):  Appropriate, Pleasant Orientation:  Oriented to Self, Oriented to Place, Oriented to  Time, Oriented to Situation Alcohol / Substance use:  Never Used Psych involvement (Current and /or in the community):  Yes (Comment)(Patient will need a level II [name] change in status)  Discharge Needs  Concerns to be addressed:  Care Coordination, Basic Needs Readmission within the last 30 days:  No Current discharge risk:  Chronically ill, Psychiatric Illness Barriers to Discharge:  Continued Medical Work up, Programmer, applications ([place])   Zettie Pho, LCSW 06/13/2017, 10:33 AM

## 2017-06-13 NOTE — Clinical Social Work Placement (Signed)
   CLINICAL SOCIAL WORK PLACEMENT  NOTE  Date:  06/13/2017  Patient Details  Name: Meghan [name] MRN: [id number] Date of Birth: [birth year]  Clinical Social Work is seeking post-discharge placement for this patient at the [place] level of care (*CSW will initial, date and re-position this form in  chart as items are completed):  Yes   Patient/family provided with [place] Work Department's list of facilities offering this level of care within the geographic area requested by the patient (or if unable, by the patient's family).  Yes   Patient/family informed of their freedom to choose among providers that offer the needed level of care, that participate in Medicare, Medicaid or managed care program needed by the patient, have an available bed and are willing to accept the patient.  Yes   Patient/family informed of [facility]'s ownership interest in [facility] and [facility], as well as of the fact that they are under no obligation to receive care at these facilities.  PASRR submitted to EDS on 06/13/17     PASRR number received on       Existing PASRR number confirmed on       FL2 transmitted to all facilities in geographic area requested by pt/family on 06/13/17     FL2 transmitted to all facilities within larger geographic area on       Patient informed that his/her managed care company has contracts with or will negotiate with certain facilities, including the following:        Yes   Patient/family informed of bed offers received.  Patient chooses bed at [facility])     Physician recommends and patient chooses bed at [facility])    Patient to be transferred to [facility]) on  .  Patient to be transferred to facility by       Patient family notified on   of transfer.  Name of family member notified:        PHYSICIAN Please sign DNR     Additional Comment:     _______________________________________________ Zettie Pho, LCSW 06/13/2017, 10:36 AM

## 2017-06-13 NOTE — Plan of Care (Signed)
RN will administer PRN pain medication to help control patient's pain when needed.

## 2017-06-14 ENCOUNTER — Inpatient Hospital Stay: Payer: Medicare Other | Admitting: Anesthesiology

## 2017-06-14 ENCOUNTER — Inpatient Hospital Stay: Payer: Medicare Other

## 2017-06-14 ENCOUNTER — Encounter
Admission: EM | Disposition: A | Discharge status: Skilled Nursing Facility | Payer: Self-pay | Source: Home / Self Care | Attending: Specialist

## 2017-06-14 HISTORY — PX: KYPHOPLASTY: SHX5884

## 2017-06-14 LAB — GLUCOSE, CAPILLARY
GLUCOSE-CAPILLARY: 110 mg/dL — AB (ref 65–99)
GLUCOSE-CAPILLARY: 102 mg/dL — AB (ref 65–99)
Glucose-Capillary: 105 mg/dL — ABNORMAL HIGH (ref 65–99)
Glucose-Capillary: 100 mg/dL — ABNORMAL HIGH (ref 65–99)
Glucose-Capillary: 97 mg/dL (ref 65–99)
Glucose-Capillary: 100 mg/dL — ABNORMAL HIGH (ref 65–99)

## 2017-06-14 SURGERY — KYPHOPLASTY
Anesthesia: Monitor Anesthesia Care | Site: Back | Wound class: Clean

## 2017-06-14 MED ORDER — OXYCODONE-ACETAMINOPHEN 5-325 MG PO TABS: 1.0000 | ORAL_TABLET | ORAL | Status: DC | PRN

## 2017-06-14 MED ORDER — PROPOFOL 500 MG/50ML IV EMUL
INTRAVENOUS | Status: DC | PRN
  Administered 2017-06-14: 40 ug/kg/min via INTRAVENOUS

## 2017-06-14 MED ORDER — SODIUM CHLORIDE 0.9 % IV SOLN
Freq: Once | INTRAVENOUS | Status: AC
  Administered 2017-06-14: 15:00:00 via INTRAVENOUS

## 2017-06-14 MED ORDER — FENTANYL CITRATE (PF) 100 MCG/2ML IJ SOLN
INTRAMUSCULAR | Status: AC
  Filled 2017-06-14: qty 2

## 2017-06-14 MED ORDER — SODIUM CHLORIDE 0.9 % IV SOLN
INTRAVENOUS | Status: DC | PRN
  Administered 2017-06-14: 22:00:00 via INTRAVENOUS

## 2017-06-14 MED ORDER — INFLUENZA VAC SPLIT HIGH-DOSE 0.5 ML IM SUSY
0.5000 mL | PREFILLED_SYRINGE | INTRAMUSCULAR | Status: AC
  Administered 2017-06-15: 0.5 mL via INTRAMUSCULAR
  Filled 2017-06-14: qty 0.5

## 2017-06-14 MED ORDER — METHYLPREDNISOLONE SODIUM SUCC 125 MG IJ SOLR
INTRAMUSCULAR | Status: AC
  Filled 2017-06-14: qty 2

## 2017-06-14 MED ORDER — CEFAZOLIN SODIUM-DEXTROSE 1-4 GM/50ML-% IV SOLN
INTRAVENOUS | Status: DC | PRN
  Administered 2017-06-14: 1 g via INTRAVENOUS

## 2017-06-14 MED ORDER — BUPIVACAINE-EPINEPHRINE (PF) 0.5% -1:200000 IJ SOLN
INTRAMUSCULAR | Status: DC | PRN
  Administered 2017-06-14: 20 mL via PERINEURAL

## 2017-06-14 MED ORDER — POTASSIUM CHLORIDE CRYS ER 20 MEQ PO TBCR
20.0000 meq | EXTENDED_RELEASE_TABLET | Freq: Every day | ORAL | Status: DC
  Administered 2017-06-15: 20 meq via ORAL
  Filled 2017-06-14: qty 1

## 2017-06-14 MED ORDER — MIDAZOLAM HCL 2 MG/2ML IJ SOLN
INTRAMUSCULAR | Status: DC | PRN
  Administered 2017-06-14 (×2): 1 mg via INTRAVENOUS

## 2017-06-14 MED ORDER — ONDANSETRON HCL 4 MG/2ML IJ SOLN: 4.0000 mg | Freq: Once | INTRAMUSCULAR | Status: DC | PRN

## 2017-06-14 MED ORDER — FENTANYL CITRATE (PF) 100 MCG/2ML IJ SOLN
25.0000 ug | INTRAMUSCULAR | Status: DC | PRN
  Administered 2017-06-14: 25 ug via INTRAVENOUS
  Administered 2017-06-14: 50 ug via INTRAVENOUS
  Administered 2017-06-14: 25 ug via INTRAVENOUS

## 2017-06-14 MED ORDER — PROPOFOL 10 MG/ML IV BOLUS
INTRAVENOUS | Status: AC
  Filled 2017-06-14: qty 20

## 2017-06-14 MED ORDER — BUPIVACAINE-EPINEPHRINE (PF) 0.5% -1:200000 IJ SOLN
INTRAMUSCULAR | Status: AC
  Filled 2017-06-14: qty 30

## 2017-06-14 MED ORDER — LIDOCAINE HCL 1 % IJ SOLN
INTRAMUSCULAR | Status: DC | PRN
  Administered 2017-06-14: 30 mL

## 2017-06-14 MED ORDER — MIDAZOLAM HCL 2 MG/2ML IJ SOLN
INTRAMUSCULAR | Status: AC
  Filled 2017-06-14: qty 2

## 2017-06-14 MED ORDER — SEVOFLURANE IN SOLN
RESPIRATORY_TRACT | Status: AC
  Filled 2017-06-14: qty 250

## 2017-06-14 MED ORDER — IOPAMIDOL (ISOVUE-M 200) INJECTION 41%
INTRAMUSCULAR | Status: AC
  Filled 2017-06-14: qty 20

## 2017-06-14 MED ORDER — LIDOCAINE HCL (PF) 1 % IJ SOLN
INTRAMUSCULAR | Status: AC
  Filled 2017-06-14: qty 60

## 2017-06-14 SURGICAL SUPPLY — 18 items
BNDG ADH 2 X3.75 FABRIC TAN LF (GAUZE/BANDAGES/DRESSINGS) ×3 IMPLANT
CEMENT KYPHON CX01A KIT/MIXER (Cement) ×6 IMPLANT
DERMABOND ADVANCED (GAUZE/BANDAGES/DRESSINGS) ×2
DERMABOND ADVANCED .7 DNX12 (GAUZE/BANDAGES/DRESSINGS) ×1 IMPLANT
DEVICE BIOPSY BONE KYPHX (INSTRUMENTS) ×3 IMPLANT
DRAPE C-ARM XRAY 36X54 (DRAPES) ×3 IMPLANT
DRESSING ALLEVYN LIFE SACRUM (GAUZE/BANDAGES/DRESSINGS) ×3 IMPLANT
DURAPREP 26ML APPLICATOR (WOUND CARE) ×3 IMPLANT
GLOVE SURG SYN 9.0  PF PI (GLOVE) ×6
GLOVE SURG SYN 9.0 PF PI (GLOVE) ×3 IMPLANT
GOWN SRG 2XL LVL 4 RGLN SLV (GOWNS) ×1 IMPLANT
GOWN STRL NON-REIN 2XL LVL4 (GOWNS) ×2
GOWN STRL REUS W/ TWL LRG LVL3 (GOWN DISPOSABLE) ×1 IMPLANT
GOWN STRL REUS W/TWL LRG LVL3 (GOWN DISPOSABLE) ×2
PACK KYPHOPLASTY (MISCELLANEOUS) ×3 IMPLANT
STRAP SAFETY BODY (MISCELLANEOUS) ×3 IMPLANT
TRAY KYPHOPAK 15/3 EXPRESS 1ST (MISCELLANEOUS) ×3 IMPLANT
TRAY KYPHOPAK 20/3 EXPRESS 1ST (MISCELLANEOUS) IMPLANT

## 2017-06-14 NOTE — Progress Notes (Signed)
PT Cancellation Note  Patient Details Name: Meghan [name] MRN: [id number] DOB: 05-09-1946   Cancelled Treatment:    Reason Eval/Treat Not Completed: Patient at procedure or test/unavailable.  Pt currently out of room.  Per notes, plan for kyphoplasty today.  Will re-attempt PT session at a later date/time as medically appropriate.  Leitha Bleak, PT 06/14/17, 2:54 PM [phone]

## 2017-06-14 NOTE — Transfer of Care (Signed)
Immediate Anesthesia Transfer of Care Note  Patient: Meghan [name]  Procedure(s) Performed: KYPHOPLASTY L1 (N/A Back)  Patient Location: PACU  Anesthesia Type:General  Level of Consciousness: sedated  Airway & Oxygen Therapy: Patient Spontanous Breathing and Patient connected to nasal cannula oxygen  Post-op Assessment: Report given to RN and Post -op Vital signs reviewed and stable  Post vital signs: Reviewed and stable  Last Vitals:  Vitals:   06/14/17 0837 06/14/17 1441  BP: 112/71 97/68  Pulse: 94 75  Resp: 16 16  Temp: (!) 36.3 C (!) 36.4 C  SpO2: 99% 96%    Last Pain:  Vitals:   06/14/17 1441  TempSrc: Tympanic  PainSc:       Patients Stated Pain Goal: 2 (95/09/32 6712)  Complications: No apparent anesthesia complications

## 2017-06-14 NOTE — Progress Notes (Signed)
To surgery

## 2017-06-14 NOTE — Op Note (Signed)
06/14/2017  10:20 PM  PATIENT:  Meghan [name]  71 y.o. female  PRE-OPERATIVE DIAGNOSIS:  compression fracture L1  POST-OPERATIVE DIAGNOSIS:  same as preop  PROCEDURE:  Procedure(s): KYPHOPLASTY L1 (N/A)  SURGEON: Laurene Footman, MD  ASSISTANTS: None  ANESTHESIA:   local and MAC  EBL:  Total I/O In: 500 [I.V.:500] Out: -   BLOOD ADMINISTERED:none  DRAINS: none   LOCAL MEDICATIONS USED:  MARCAINE    and XYLOCAINE   SPECIMEN:  Source of Specimen:  L1 vertebral body  DISPOSITION OF SPECIMEN:  PATHOLOGY  COUNTS:  YES  TOURNIQUET:  * No tourniquets in log *  IMPLANTS: Bone cement  DICTATION: .Dragon Dictation  Patient brought the operating room and after adequate sedation was obtained the patient was placed prone and C-arm brought in with good visualization of  1 on the C-arm. After appropriate patient identification and timeout procedure local anesthetic was infiltratedon the rightat L1. The back was then prepped and draped in sterile fashion and repeat timeout procedure carried out. Spinal needle was used to get local asthenic down to the pedicle on theright at  L1  Small incision was then made and trocar advanced into the vertebral body and an extra pedicular fashion taking frequent C-arm views to make sure the neural foramen and spinal canal were not entered. Specimen was obtained during biopsy part of the procedure followed by drilling carried out followed by placement of balloon inflation of L1balloon to 2 cc . Next the cement was mixed and was appropriate consistency it was used to fill the vertebral bodies with about2.5cc into L1with good fill and interdigitation. After the cement was set the trochar wasremoved and permanent C-arm views showed adequate position of the cement with adequate fill superior to inferior endplates medial and lateral. The wound wasclosed with Dermabond followed by Band aid    PLAN OF CARE: Continue as obs  PATIENT DISPOSITION:   PACU - hemodynamically stable.

## 2017-06-14 NOTE — Progress Notes (Signed)
[place] at [place] NAME: Meghan [name]    MR#:  [id number]  DATE OF BIRTH:  04-07-1946  SUBJECTIVE:   No acute events overnight, still having some back pain. Plan for kyphoplasty today.  REVIEW OF SYSTEMS:    Review of Systems  Constitutional: Negative for chills and fever.  HENT: Negative for congestion and tinnitus.   Eyes: Negative for blurred vision and double vision.  Respiratory: Negative for cough, shortness of breath and wheezing.   Cardiovascular: Negative for chest pain, orthopnea and PND.  Gastrointestinal: Negative for abdominal pain, diarrhea, nausea and vomiting.  Genitourinary: Negative for dysuria and hematuria.  Musculoskeletal: Positive for back pain.  Neurological: Negative for dizziness, sensory change and focal weakness.  All other systems reviewed and are negative.   Nutrition: heart Healthy Tolerating Diet: Yes Tolerating PT: Await Eval.   DRUG ALLERGIES:   Allergies  Allergen Reactions  . Gabapentin Other (See Comments)    Pt states that it causes her BP to drop.   . Naproxen Hives  . Nsaids Other (See Comments)    Reaction:  Unknown     VITALS:  Blood pressure 97/68, pulse 75, temperature (!) 97.5 F (36.4 C), temperature source Tympanic, resp. rate 16, height 4\' 8"  (1.422 m), weight 51.4 kg (113 lb 6.4 oz), SpO2 96 %.  PHYSICAL EXAMINATION:   Physical Exam  GENERAL:  71 y.o.-year-old patient lying in bed in no acute distress.  EYES: Pupils equal, round, reactive to light and accommodation. No scleral icterus. Extraocular muscles intact.  HEENT: Head atraumatic, normocephalic. Oropharynx and nasopharynx clear.  NECK:  Supple, no jugular venous distention. No thyroid enlargement, no tenderness.  LUNGS: Normal breath sounds bilaterally, no wheezing, rales, rhonchi. No use of accessory muscles of respiration.  CARDIOVASCULAR: S1, S2 normal. No murmurs, rubs, or gallops.  ABDOMEN: Soft, nontender,  nondistended. Bowel sounds present. No organomegaly or mass.  EXTREMITIES: No cyanosis, clubbing or edema b/l.    NEUROLOGIC: Cranial nerves II through XII are intact. No focal Motor or sensory deficits b/l.   PSYCHIATRIC: The patient is alert and oriented x 3.  SKIN: No obvious rash, lesion, or ulcer.    LABORATORY PANEL:   CBC Recent Labs  Lab 06/09/17 1712  WBC 5.4  HGB 12.8  HCT 38.3  PLT 284   ------------------------------------------------------------------------------------------------------------------  Chemistries  Recent Labs  Lab 06/09/17 1712 06/09/17 2336  06/12/17 0648  NA 141  --    < > 138  K 2.8*  --    < > 4.4  CL 108  --    < > 104  CO2 17*  --    < > 27  GLUCOSE 100*  --    < > 114*  BUN 11  --    < > 14  CREATININE 0.53  --    < > 0.43*  CALCIUM 8.1*  --    < > 8.6*  MG  --  1.7  --   --   AST 45*  --   --   --   ALT 25  --   --   --   ALKPHOS 77  --   --   --   BILITOT 1.9*  --   --   --    < > = values in this interval not displayed.   ------------------------------------------------------------------------------------------------------------------  Cardiac Enzymes Recent Labs  Lab 06/10/17 2309  TROPONINI 0.08*   ------------------------------------------------------------------------------------------------------------------  RADIOLOGY:  No  results found.   ASSESSMENT AND PLAN:   71 year old female with history of essential hypertension, hyperlipidemia and chronic diastolic heart failure who presents with syncope.  1. Mechanical fall/Syncope: Patient has been seen and evaluated by cardiology for possible syncope however it does not appear the patient has had a syncopal episode.  -Patient has had extensive workup for syncope which has been negative. Seen by cardiology and as per them plan for possible placement of a link device for assessment of syncope in the near future. -Await physical therapy evaluation post-kyphoplasty  2.  Back pain/right hip pain-patient's CT of the hip is negative for any fracture. MRI of the lumbar spine confirmed L1 compression fracture. -Appreciate orthopedics input and plan for Kyphoplasty later today.  3. Mild rhabdomyolysis after mechanical fall: CK's have improved with IV fluids  4. Hypokalemia: resolved w/ supplementation.   5. Diabetes: BS stable and cont. SSI.  - appreciate Diabetes Coordinator Input.   6. Essential hypertension: cont. Metoprolol.   7. Chronic diastolic heart failure without signs of exacerbation: Continue Lasix, Metoprolol.   9. Depression/anxiety: Continue BuSpar/Lamotrigine/Remeron  Likely discharge to short-term rehabilitation post-kyphoplasty.  All the records are reviewed and case discussed with Care Management/Social Worker. Management plans discussed with the patient, family and they are in agreement.  CODE STATUS: DNR  DVT Prophylaxis: Lovenox  TOTAL TIME TAKING CARE OF THIS PATIENT: 25 minutes.   POSSIBLE D/C IN 1-2 DAYS, DEPENDING ON CLINICAL CONDITION.   Henreitta Leber M.D on 06/14/2017 at 3:44 PM  Between 7am to 6pm - Pager - [phone]  After 6pm go to [email or web address] - password [email or web address] [facility]  Office  [phone]  CC: Primary care physician; Lavera Guise, MD

## 2017-06-14 NOTE — Anesthesia Procedure Notes (Signed)
Date/Time: 06/14/2017 9:58 PM Performed by: Nelda Marseille, CRNA Pre-anesthesia Checklist: Patient identified, Emergency Drugs available, Suction available, Patient being monitored and Timeout performed Oxygen Delivery Method: Nasal cannula

## 2017-06-14 NOTE — Progress Notes (Signed)
Patient seen, somewhat confused right now.  Plan procedure later today, can transfer to rehab when medically ok after.

## 2017-06-14 NOTE — Anesthesia Postprocedure Evaluation (Signed)
Anesthesia Post Note  Patient: Meghan [name]  Procedure(s) Performed: KYPHOPLASTY L1 (N/A Back)  Patient location during evaluation: PACU Anesthesia Type: MAC Level of consciousness: awake and alert Pain management: pain level controlled Vital Signs Assessment: post-procedure vital signs reviewed and stable Respiratory status: spontaneous breathing and respiratory function stable Cardiovascular status: stable Anesthetic complications: no     Last Vitals:  Vitals:   06/14/17 2223 06/14/17 2225  BP: (!) 97/55   Pulse: 98 92  Resp: 13 16  Temp: (!) 36.3 C   SpO2: 96% 95%    Last Pain:  Vitals:   06/14/17 1441  TempSrc: Tympanic  PainSc:                  KEPHART,WILLIAM K

## 2017-06-14 NOTE — Clinical Social Work Note (Signed)
CSW received passar for patient, passar number is [id number] E expires on 07/13/2017.  Patient has bed at [facility].  Jones Broom. Kiwan G[place]den, MSW, [place]  06/14/2017 9:46 AM

## 2017-06-14 NOTE — Anesthesia Post-op Follow-up Note (Signed)
Anesthesia QCDR form completed.        

## 2017-06-15 ENCOUNTER — Encounter: Payer: Self-pay | Admitting: Orthopedic Surgery

## 2017-06-15 DIAGNOSIS — Z23 Encounter for immunization: Secondary | ICD-10-CM | POA: Diagnosis not present

## 2017-06-15 DIAGNOSIS — M5136 Other intervertebral disc degeneration, lumbar region: Secondary | ICD-10-CM | POA: Diagnosis not present

## 2017-06-15 DIAGNOSIS — E876 Hypokalemia: Secondary | ICD-10-CM | POA: Diagnosis not present

## 2017-06-15 DIAGNOSIS — R269 Unspecified abnormalities of gait and mobility: Secondary | ICD-10-CM | POA: Diagnosis not present

## 2017-06-15 DIAGNOSIS — M6281 Muscle weakness (generalized): Secondary | ICD-10-CM | POA: Diagnosis not present

## 2017-06-15 DIAGNOSIS — M359 Systemic involvement of connective tissue, unspecified: Secondary | ICD-10-CM | POA: Diagnosis not present

## 2017-06-15 DIAGNOSIS — R55 Syncope and collapse: Secondary | ICD-10-CM | POA: Diagnosis not present

## 2017-06-15 DIAGNOSIS — E119 Type 2 diabetes mellitus without complications: Secondary | ICD-10-CM | POA: Diagnosis not present

## 2017-06-15 DIAGNOSIS — J309 Allergic rhinitis, unspecified: Secondary | ICD-10-CM | POA: Diagnosis not present

## 2017-06-15 DIAGNOSIS — R000 Tachycardia, unspecified: Secondary | ICD-10-CM | POA: Diagnosis not present

## 2017-06-15 DIAGNOSIS — Z7401 Bed confinement status: Secondary | ICD-10-CM | POA: Diagnosis not present

## 2017-06-15 DIAGNOSIS — M810 Age-related osteoporosis without current pathological fracture: Secondary | ICD-10-CM | POA: Diagnosis not present

## 2017-06-15 DIAGNOSIS — K219 Gastro-esophageal reflux disease without esophagitis: Secondary | ICD-10-CM | POA: Diagnosis not present

## 2017-06-15 DIAGNOSIS — M4856XD Collapsed vertebra, not elsewhere classified, lumbar region, subsequent encounter for fracture with routine healing: Secondary | ICD-10-CM | POA: Diagnosis not present

## 2017-06-15 DIAGNOSIS — S32010A Wedge compression fracture of first lumbar vertebra, initial encounter for closed fracture: Secondary | ICD-10-CM | POA: Diagnosis not present

## 2017-06-15 DIAGNOSIS — M791 Myalgia, unspecified site: Secondary | ICD-10-CM | POA: Diagnosis not present

## 2017-06-15 DIAGNOSIS — Z9889 Other specified postprocedural states: Secondary | ICD-10-CM | POA: Diagnosis not present

## 2017-06-15 DIAGNOSIS — I10 Essential (primary) hypertension: Secondary | ICD-10-CM | POA: Diagnosis not present

## 2017-06-15 DIAGNOSIS — M6282 Rhabdomyolysis: Secondary | ICD-10-CM | POA: Diagnosis not present

## 2017-06-15 DIAGNOSIS — I2510 Atherosclerotic heart disease of native coronary artery without angina pectoris: Secondary | ICD-10-CM | POA: Diagnosis not present

## 2017-06-15 DIAGNOSIS — J9621 Acute and chronic respiratory failure with hypoxia: Secondary | ICD-10-CM | POA: Diagnosis not present

## 2017-06-15 DIAGNOSIS — R011 Cardiac murmur, unspecified: Secondary | ICD-10-CM | POA: Diagnosis not present

## 2017-06-15 DIAGNOSIS — M25551 Pain in right hip: Secondary | ICD-10-CM | POA: Diagnosis not present

## 2017-06-15 DIAGNOSIS — R296 Repeated falls: Secondary | ICD-10-CM | POA: Diagnosis not present

## 2017-06-15 DIAGNOSIS — W19XXXA Unspecified fall, initial encounter: Secondary | ICD-10-CM | POA: Diagnosis not present

## 2017-06-15 DIAGNOSIS — M0589 Other rheumatoid arthritis with rheumatoid factor of multiple sites: Secondary | ICD-10-CM | POA: Diagnosis not present

## 2017-06-15 DIAGNOSIS — E7800 Pure hypercholesterolemia, unspecified: Secondary | ICD-10-CM | POA: Diagnosis not present

## 2017-06-15 DIAGNOSIS — J449 Chronic obstructive pulmonary disease, unspecified: Secondary | ICD-10-CM | POA: Diagnosis not present

## 2017-06-15 DIAGNOSIS — J45998 Other asthma: Secondary | ICD-10-CM | POA: Diagnosis not present

## 2017-06-15 DIAGNOSIS — Z95818 Presence of other cardiac implants and grafts: Secondary | ICD-10-CM | POA: Diagnosis not present

## 2017-06-15 LAB — GLUCOSE, CAPILLARY
GLUCOSE-CAPILLARY: 130 mg/dL — AB (ref 65–99)
GLUCOSE-CAPILLARY: 171 mg/dL — AB (ref 65–99)
Glucose-Capillary: 119 mg/dL — ABNORMAL HIGH (ref 65–99)

## 2017-06-15 MED ORDER — OXYCODONE-ACETAMINOPHEN 5-325 MG PO TABS: 1.0000 | ORAL_TABLET | ORAL | 0 refills | Status: DC | PRN

## 2017-06-15 NOTE — Progress Notes (Signed)
Went over discharge instruction with the patient about medication and follow-up appointment. Called report at the facility talked to [place], [place]. Called EMS for transport. Patient no peripheral IV and will discontinue telemetry monitor.

## 2017-06-15 NOTE — Progress Notes (Signed)
Attempted to call [place] care twice for report. RN will try to call in a few minutes/

## 2017-06-15 NOTE — Care Management Important Message (Signed)
Important Message  Patient Details  Name: Meghan [name] MRN: [id number] Date of Birth: February 04, 1946   Medicare Important Message Given:  Yes    Katrina Stack, RN 06/15/2017, 2:44 PM

## 2017-06-15 NOTE — Clinical Social Work Note (Signed)
Patient to be d/c'ed today to [facility].  Patient and family agreeable to plans will transport via ems RN to call report [phone].  CSW updated patient's grandaughter [name] [phone].   Evette Cristal, MSW, [place]

## 2017-06-15 NOTE — Discharge Summary (Signed)
[facility] [place] NAME: Meghan [name]    MR#:  [id number]  DATE OF BIRTH:  10-13-1945  DATE OF ADMISSION:  06/09/2017 ADMITTING PHYSICIAN: Demetrios Loll, MD  DATE OF DISCHARGE: 06/15/2017  PRIMARY CARE PHYSICIAN: Lavera Guise, MD    ADMISSION DIAGNOSIS:  Fall, initial encounter [phone]  DISCHARGE DIAGNOSIS:  Active Problems:   Hypokalemia   SECONDARY DIAGNOSIS:   Past Medical History:  Diagnosis Date  . Anginal pain ([place])   . Anxiety   . Arthritis    RA  . Asthma   . Brain tumor (benign) ([place])   . CHF (congestive heart failure) ([place])   . Collagen vascular disease ([place])   . COPD (chronic obstructive pulmonary disease) ([place])   . Coronary artery disease   . DDD (degenerative disc disease)   . Depression   . Diabetes mellitus   . GERD (gastroesophageal reflux disease)   . Headache   . Heart murmur   . Heart murmur   . Hypercholesteremia   . Hypertension   . Lumbar degenerative disc disease   . Migraines   . Obese   . Obesity   . Osteopenia   . Osteoporosis   . Pneumonia   . PTSD (post-traumatic stress disorder)     HOSPITAL COURSE:   71 year old female with history of essential hypertension, hyperlipidemia and chronic diastolic heart failure who presents with syncope.  1. Mechanical fall/Syncope: Patient has been seen and evaluated by cardiology for possible syncope however it does not appear the patient had a syncopal episode.  -Patient has had extensive workup for syncope which has been negative in the past. As per Cardiology plan for possible placement of a link device for assessment of syncope in the near future as outpatient.  - Seen by PT post Kyphoplasty and they recommend SNF/STR and pt. Is being discharged there.   2. Back pain/right hip pain-patient's CT of the hip was negative for any fracture. MRI of the lumbar spine confirmed L1 compression fracture.  - seen by Ortho and pt. Is s/p Kyphoplasty POD # 1.   - cont. Pain control with PRN percocet and cont. PT and pt. To be discharged to rehab today.   3. Mild rhabdomyolysis after mechanical fall: CK's have normalized now.   4. Hypokalemia: resolved w/ supplementation.   5. Diabetes: while in the hospital pt. Was on SSI but will resume her Onglyza upon discharge.   6. Essential hypertension: She will resume her low dose Lisinopril.    7. Chronic diastolic heart failure without signs of exacerbation: pt. Will resume her low dose Lasix, Lisinopril upon discharge. Clinically euvolemic presently.     9. Depression/anxiety: she will Continue BuSpar/Lamotrigine/Remeron    DISCHARGE CONDITIONS:   Stable.   CONSULTS OBTAINED:  Treatment Team:  Yolonda Kida, MD Hessie Knows, MD  DRUG ALLERGIES:   Allergies  Allergen Reactions  . Gabapentin Other (See Comments)    Pt states that it causes her BP to drop.   . Naproxen Hives  . Nsaids Other (See Comments)    Reaction:  Unknown     DISCHARGE MEDICATIONS:   Allergies as of 06/15/2017      Reactions   Gabapentin Other (See Comments)   Pt states that it causes her BP to drop.    Naproxen Hives   Nsaids Other (See Comments)   Reaction:  Unknown       Medication List    STOP taking these  medications   budesonide 0.5 MG/2ML nebulizer solution Commonly known as:  PULMICORT   carvedilol 3.125 MG tablet Commonly known as:  COREG   ipratropium-albuterol 0.5-2.5 (3) MG/3ML Soln Commonly known as:  DUONEB   ondansetron 4 MG disintegrating tablet Commonly known as:  ZOFRAN ODT     TAKE these medications   busPIRone 7.5 MG tablet Commonly known as:  BUSPAR Take 1 tablet by mouth 2 (two) times daily.   diphenoxylate-atropine 2.5-0.025 MG tablet Commonly known as:  LOMOTIL Take 1 tablet by mouth every 6 (six) hours as needed for diarrhea or loose stools.   escitalopram 10 MG tablet Commonly known as:  LEXAPRO Take 1 tablet (10 mg total) by mouth every morning.    esomeprazole 40 MG capsule Commonly known as:  NEXIUM Take 40 mg by mouth daily at 12 noon.   feeding supplement (ENSURE ENLIVE) Liqd Take 237 mLs by mouth 2 (two) times daily between meals.   furosemide 40 MG tablet Commonly known as:  LASIX Take 0.5 tablets (20 mg total) by mouth daily.   insulin starter kit- pen needles Misc 1 kit by Other route once.   lamoTRIgine 100 MG tablet Commonly known as:  LAMICTAL Take 1 tablet (100 mg total) by mouth daily. What changed:  when to take this   lisinopril 2.5 MG tablet Commonly known as:  PRINIVIL,ZESTRIL Take 1 tablet by mouth 2 (two) times daily.   loperamide 2 MG tablet Commonly known as:  IMODIUM A-D Take 1 tablet (2 mg total) by mouth 4 (four) times daily as needed for diarrhea or loose stools.   LYRICA 50 MG capsule Generic drug:  pregabalin TAKE ONE CAPSULE BY MOUTH TWICE A DAY   mirtazapine 7.5 MG tablet Commonly known as:  REMERON Take 7.5 mg by mouth at bedtime.   montelukast 10 MG tablet Commonly known as:  SINGULAIR Take 10 mg by mouth daily.   omeprazole 20 MG capsule Commonly known as:  PRILOSEC Take 20 mg by mouth at bedtime.   ONGLYZA 5 MG Tabs tablet Generic drug:  saxagliptin HCl Take 5 mg by mouth at bedtime.   oxyCODONE-acetaminophen 5-325 MG tablet Commonly known as:  PERCOCET/ROXICET Take 1 tablet every 4 (four) hours as needed by mouth for moderate pain or severe pain.   Potassium Chloride ER 20 MEQ Tbcr Take 20 mEq by mouth 2 (two) times daily.   pramipexole 0.25 MG tablet Commonly known as:  MIRAPEX Take 0.25 mg by mouth daily.   predniSONE 5 MG tablet Commonly known as:  DELTASONE Take 5 mg by mouth daily with breakfast.   promethazine 25 MG suppository Commonly known as:  PHENERGAN Place 1 suppository (25 mg total) rectally every 6 (six) hours as needed for nausea. What changed:  Another medication with the same name was removed. Continue taking this medication, and follow the  directions you see here.   Tofacitinib Citrate 5 MG Tabs Take by mouth 2 (two) times daily after a meal.   topiramate 25 MG tablet Commonly known as:  TOPAMAX Take 25 mg by mouth at bedtime.   VITAMIN B 12 PO Take 500 mg by mouth daily.         DISCHARGE INSTRUCTIONS:   DIET:  Cardiac diet and Diabetic diet  DISCHARGE CONDITION:  Stable  ACTIVITY:  Activity as tolerated  OXYGEN:  Home Oxygen: No.   Oxygen Delivery: room air  DISCHARGE LOCATION:  nursing home   If you experience worsening of your admission symptoms,  develop shortness of breath, life threatening emergency, suicidal or homicidal thoughts you must seek medical attention immediately by calling 911 or calling your MD immediately  if symptoms less severe.  You Must read complete instructions/literature along with all the possible adverse reactions/side effects for all the Medicines you take and that have been prescribed to you. Take any new Medicines after you have completely understood and accpet all the possible adverse reactions/side effects.   Please note  You were cared for by a hospitalist during your hospital stay. If you have any questions about your discharge medications or the care you received while you were in the hospital after you are discharged, you can call the unit and asked to speak with the hospitalist on call if the hospitalist that took care of you is not available. Once you are discharged, your primary care physician will handle any further medical issues. Please note that NO REFILLS for any discharge medications will be authorized once you are discharged, as it is imperative that you return to your primary care physician (or establish a relationship with a primary care physician if you do not have one) for your aftercare needs so that they can reassess your need for medications and monitor your lab values.     Today   Pt. Is a little bit confused today. S/p Kyphoplasty POD # 1 today.  Pain controlled with oral meds. No other acute events overnight. D/C to SNF today.    VITAL SIGNS:  Blood pressure 118/72, pulse (!) 106, temperature 98.2 F (36.8 C), temperature source Oral, resp. rate 20, height 4' 8"  (1.422 m), weight 50.8 kg (112 lb), SpO2 96 %.  I/O:    Intake/Output Summary (Last 24 hours) at 06/15/2017 1137 Last data filed at 06/15/2017 1049 Gross per 24 hour  Intake 830 ml  Output 226 ml  Net 604 ml    PHYSICAL EXAMINATION:   GENERAL:  71 y.o.-year-old patient lying in bed in no acute distress.  EYES: Pupils equal, round, reactive to light and accommodation. No scleral icterus. Extraocular muscles intact.  HEENT: Head atraumatic, normocephalic. Oropharynx and nasopharynx clear.  NECK:  Supple, no jugular venous distention. No thyroid enlargement, no tenderness.  LUNGS: Normal breath sounds bilaterally, no wheezing, rales, rhonchi. No use of accessory muscles of respiration.  CARDIOVASCULAR: S1, S2 normal. No murmurs, rubs, or gallops.  ABDOMEN: Soft, nontender, nondistended. Bowel sounds present. No organomegaly or mass.  EXTREMITIES: No cyanosis, clubbing or edema b/l.    NEUROLOGIC: Cranial nerves II through XII are intact. No focal Motor or sensory deficits b/l.   PSYCHIATRIC: The patient is alert and oriented x 2.  SKIN: No obvious rash, lesion, or ulcer.     DATA REVIEW:   CBC Recent Labs  Lab 06/09/17 1712  WBC 5.4  HGB 12.8  HCT 38.3  PLT 284    Chemistries  Recent Labs  Lab 06/09/17 1712 06/09/17 2336  06/12/17 0648  NA 141  --    < > 138  K 2.8*  --    < > 4.4  CL 108  --    < > 104  CO2 17*  --    < > 27  GLUCOSE 100*  --    < > 114*  BUN 11  --    < > 14  CREATININE 0.53  --    < > 0.43*  CALCIUM 8.1*  --    < > 8.6*  MG  --  1.7  --   --  AST 45*  --   --   --   ALT 25  --   --   --   ALKPHOS 77  --   --   --   BILITOT 1.9*  --   --   --    < > = values in this interval not displayed.    Cardiac Enzymes Recent  Labs  Lab 06/10/17 2309  TROPONINI 0.08*    Microbiology Results  Results for orders placed or performed during the hospital encounter of 06/09/17  MRSA PCR Screening     Status: Abnormal   Collection Time: 06/09/17 10:38 PM  Result Value Ref Range Status   MRSA by PCR POSITIVE (A) NEGATIVE Final    Comment:        The GeneXpert MRSA Assay (FDA approved for NASAL specimens only), is one component of a comprehensive MRSA colonization surveillance program. It is not intended to diagnose MRSA infection nor to guide or monitor treatment for MRSA infections. RESULT CALLED TO, READ BACK BY AND VERIFIED WITH: LEXI MILLER @ 0103 ON 06/10/2017 BY [facility]     RADIOLOGY:  Dg Lumbar Spine 2-3 Views  Result Date: 06/14/2017 CLINICAL DATA:  Kyphoplasty EXAM: LUMBAR SPINE - 2-3 VIEW; DG C-ARM 61-120 MIN COMPARISON:  MRI  06/11/2017 FINDINGS: Two low resolution spot intraoperative views of the lumbar spine. Total fluoroscopy time was 31 seconds. The images demonstrate kyphoplasty at what appears to be L1. IMPRESSION: Intraoperative fluoroscopic assistance provided during kyphoplasty Electronically Signed   By: Donavan Foil M.D.   On: 06/14/2017 23:14   Dg C-arm 1-60 Min  Result Date: 06/14/2017 CLINICAL DATA:  Kyphoplasty EXAM: LUMBAR SPINE - 2-3 VIEW; DG C-ARM 61-120 MIN COMPARISON:  MRI  06/11/2017 FINDINGS: Two low resolution spot intraoperative views of the lumbar spine. Total fluoroscopy time was 31 seconds. The images demonstrate kyphoplasty at what appears to be L1. IMPRESSION: Intraoperative fluoroscopic assistance provided during kyphoplasty Electronically Signed   By: Donavan Foil M.D.   On: 06/14/2017 23:14      Management plans discussed with the patient, family and they are in agreement.  CODE STATUS:     Code Status Orders  (From admission, onward)        Start     Ordered   06/09/17 2203  Do not attempt resuscitation (DNR)  Continuous    Question Answer Comment  In  the event of cardiac or respiratory ARREST Do not call a "code blue"   In the event of cardiac or respiratory ARREST Do not perform Intubation, CPR, defibrillation or ACLS   In the event of cardiac or respiratory ARREST Use medication by any route, position, wound care, and other measures to relive pain and suffering. May use oxygen, suction and manual treatment of airway obstruction as needed for comfort.      06/09/17 2202      Advance Directive Documentation     Most Recent Value  Type of Advance Directive  Living will  Pre-existing out of facility DNR order (yellow form or pink MOST form)  No data  "MOST" Form in Place?  No data      TOTAL TIME TAKING CARE OF THIS PATIENT: 40 minutes.    Henreitta Leber M.D on 06/15/2017 at 11:37 AM  Between 7am to 6pm - Pager - [phone]  After 6pm go to [email or web address] - password [email or web address] [facility]  Office  [phone]  CC: Primary care physician; Lavera Guise, MD

## 2017-06-15 NOTE — Progress Notes (Signed)
Physical Therapy Treatment Patient Details Name: Meghan [name] MRN: [id number] DOB: 11/16/1945 Today's Date: 06/15/2017    History of Present Illness Pt was admitted to hospital with dx of hypokalemia following a fall.  Pt s/p kyphoplasty 06/14/17 d/t L1 compression fx.  PT continue at transfer orders received post-op.  PMH includes htn, DM, CHF, COPD, arthritis and obesity.    PT Comments    Pt reporting improved low back pain after kyphoplasty yesterday (3-4/10 during session).  Pt appears to be ambulating better compared to initial PT evaluation but overall demonstrates generalized weakness and also required assist for balance with standing from bed d/t posterior lean.  Pt would benefit from STR to address noted deficits and decrease risk of falling.   Follow Up Recommendations  SNF     Equipment Recommendations  Rolling walker with 5" wheels    Recommendations for Other Services       Precautions / Restrictions Precautions Precautions: Fall;Back Precaution Comments: s/p kyphoplasty Restrictions Weight Bearing Restrictions: No    Mobility  Bed Mobility Overal bed mobility: Needs Assistance Bed Mobility: Supine to Sit Rolling: Supervision   Supine to sit: Supervision;HOB elevated     General bed mobility comments: increased effort and time to perform on own  Transfers Overall transfer level: Needs assistance Equipment used: Rolling walker (2 wheeled) Transfers: Sit to/from [facility] Sit to Stand: Min assist Stand pivot transfers: Min guard       General transfer comment: assist for balance sit to stand d/t posterior lean requiring assist to shift weight forward  Ambulation/Gait Ambulation/Gait assistance: Min guard Ambulation Distance (Feet): 70 Feet Assistive device: Rolling walker (2 wheeled)   Gait velocity: decreased   General Gait Details: decreased B step length/foot clearance/heelstrike; flexed trunk   Stairs             Wheelchair Mobility    Modified Rankin (Stroke Patients Only)       Balance Overall balance assessment: Needs assistance;History of Falls Sitting-balance support: No upper extremity supported;Feet supported Sitting balance-Leahy Scale: Good Sitting balance - Comments: sitting reaching within BOS   Standing balance support: Bilateral upper extremity supported Standing balance-Leahy Scale: Poor Standing balance comment: requires B UE support for static standing balance                            Cognition Arousal/Alertness: Awake/alert Behavior During Therapy: Impulsive Overall Cognitive Status: (Oriented to person, place, situation but appeared confused at times)                                        Exercises      General Comments General comments (skin integrity, edema, etc.): Pt resting in bed upon PT entry.  Pt agreeable to PT session.      Pertinent Vitals/Pain Pain Assessment: 0-10 Pain Score: 4  Pain Location: Low back Pain Descriptors / Indicators: Sore Pain Intervention(s): Limited activity within patient's tolerance;Monitored during session;Repositioned  Vitals (HR and O2 on room air) stable and WFL throughout treatment session.    Home Living                      Prior Function            PT Goals (current goals can now be found in the care plan section) Acute Rehab PT Goals  Patient Stated Goal: To be able to eventually return home with her husband. PT Goal Formulation: With patient Time For Goal Achievement: 06/24/17 Potential to Achieve Goals: Good Progress towards PT goals: Progressing toward goals    Frequency    Min 2X/week      PT Plan Current plan remains appropriate    Co-evaluation              AM-PAC PT "6 Clicks" Daily Activity  Outcome Measure  Difficulty turning over in bed (including adjusting bedclothes, sheets and blankets)?: A Little Difficulty moving from lying on back to  sitting on the side of the bed? : A Lot Difficulty sitting down on and standing up from a chair with arms (e.g., wheelchair, bedside commode, etc,.)?: Unable Help needed moving to and from a bed to chair (including a wheelchair)?: A Little Help needed walking in hospital room?: A Little Help needed climbing 3-5 steps with a railing? : A Lot 6 Click Score: 14    End of Session Equipment Utilized During Treatment: Gait belt Activity Tolerance: Patient tolerated treatment well Patient left: in chair;with call bell/phone within reach;with chair alarm set;Other (comment)(nursing student present; fall mat in place) Nurse Communication: Mobility status;Precautions PT Visit Diagnosis: Unsteadiness on feet (R26.81);Repeated falls (R29.6);Muscle weakness (generalized) (M62.81);History of falling (Z91.81);Difficulty in walking, not elsewhere classified (R26.2)     Time: 1050-1120 PT Time Calculation (min) (ACUTE ONLY): 30 min  Charges:  $Gait Training: 8-22 mins $Therapeutic Activity: 8-22 mins                    G CodesLeitha Bleak, PT 06/15/17, 11:45 AM [phone]

## 2017-06-15 NOTE — Clinical Social Work Placement (Addendum)
   CLINICAL SOCIAL WORK PLACEMENT  NOTE  Date:  06/15/2017  Patient Details  Name: Meghan [name] MRN: [id number] Date of Birth: Feb 16, 1946  Clinical Social Work is seeking post-discharge placement for this patient at the [place] level of care (*CSW will initial, date and re-position this form in  chart as items are completed):  Yes   Patient/family provided with [place] Work Department's list of facilities offering this level of care within the geographic area requested by the patient (or if unable, by the patient's family).  Yes   Patient/family informed of their freedom to choose among providers that offer the needed level of care, that participate in Medicare, Medicaid or managed care program needed by the patient, have an available bed and are willing to accept the patient.  Yes   Patient/family informed of [facility]'s ownership interest in [facility] and [facility], as well as of the fact that they are under no obligation to receive care at these facilities.  PASRR submitted to EDS on 06/13/17     PASRR number received on  06-14-17     Existing PASRR number confirmed on       FL2 transmitted to all facilities in geographic area requested by pt/family on 06/13/17     FL2 transmitted to all facilities within larger geographic area on       Patient informed that his/her managed care company has contracts with or will negotiate with certain facilities, including the following:        Yes   Patient/family informed of bed offers received 06-13-17  Patient chooses bed at [facility])     Physician recommends and patient chooses bed at      Patient to be transferred to [facility]) on 06/15/17.  Patient to be transferred to facility by [facility]     Patient family notified on 06/15/17 of transfer.  Name of family member notified:  [name]     PHYSICIAN Please sign DNR      Additional Comment:    _______________________________________________ Ross Ludwig, LCSWA 06/15/2017, 1:54 PM

## 2017-06-16 ENCOUNTER — Ambulatory Visit: Payer: Medicare Other | Admitting: Family

## 2017-06-16 ENCOUNTER — Telehealth: Payer: Self-pay | Admitting: Family

## 2017-06-16 ENCOUNTER — Other Ambulatory Visit: Payer: Self-pay | Admitting: *Deleted

## 2017-06-16 LAB — SURGICAL PATHOLOGY

## 2017-06-16 NOTE — Telephone Encounter (Signed)
Patient did not show for her [facility] Failure [facility] appointment on 06/16/17. Will attempt to reschedule.

## 2017-06-16 NOTE — Patient Outreach (Signed)
View in Epic pt discharged to [facility] 06/15/17 following recent hospitalization October 31-November 6,2018 for a fall,discharge diagnosis hypokalemia.    This RN CM followed pt for transition of care for hospitalization September 26-28,2018 for Syncope and collapse, view in Epic pt had 6 admits/2 ED visits in 6 months.   This RN CM received an in basket from [place] hospital liaison while pt was inpatient- referral made for [facility] LCSW to follow pt while at [facility].    Plan:  RN CM to follow up with pt at discharge from SNF.              Zara Chess.   [place] Care Management  [phone]

## 2017-06-17 DIAGNOSIS — M6282 Rhabdomyolysis: Secondary | ICD-10-CM | POA: Diagnosis not present

## 2017-06-17 DIAGNOSIS — M4856XD Collapsed vertebra, not elsewhere classified, lumbar region, subsequent encounter for fracture with routine healing: Secondary | ICD-10-CM | POA: Diagnosis not present

## 2017-06-17 DIAGNOSIS — J309 Allergic rhinitis, unspecified: Secondary | ICD-10-CM | POA: Diagnosis not present

## 2017-06-17 DIAGNOSIS — E119 Type 2 diabetes mellitus without complications: Secondary | ICD-10-CM | POA: Diagnosis not present

## 2017-06-20 DIAGNOSIS — J449 Chronic obstructive pulmonary disease, unspecified: Secondary | ICD-10-CM | POA: Diagnosis not present

## 2017-06-20 DIAGNOSIS — E119 Type 2 diabetes mellitus without complications: Secondary | ICD-10-CM | POA: Diagnosis not present

## 2017-06-20 DIAGNOSIS — M359 Systemic involvement of connective tissue, unspecified: Secondary | ICD-10-CM | POA: Diagnosis not present

## 2017-06-20 DIAGNOSIS — I10 Essential (primary) hypertension: Secondary | ICD-10-CM | POA: Diagnosis not present

## 2017-06-21 ENCOUNTER — Ambulatory Visit: Payer: Self-pay | Admitting: *Deleted

## 2017-06-22 ENCOUNTER — Ambulatory Visit: Payer: Self-pay | Admitting: *Deleted

## 2017-06-23 ENCOUNTER — Encounter: Payer: Self-pay | Admitting: *Deleted

## 2017-06-23 ENCOUNTER — Ambulatory Visit
Admission: RE | Admit: 2017-06-23 | Discharge: 2017-06-23 | Disposition: A | Discharge status: Home / Self Care | Payer: Medicare Other | Source: Ambulatory Visit | Attending: Cardiology | Admitting: Cardiology

## 2017-06-23 ENCOUNTER — Encounter
Admission: RE | Disposition: A | Discharge status: Home / Self Care | Payer: Self-pay | Source: Ambulatory Visit | Attending: Cardiology

## 2017-06-23 DIAGNOSIS — R55 Syncope and collapse: Secondary | ICD-10-CM | POA: Insufficient documentation

## 2017-06-23 HISTORY — PX: LOOP RECORDER INSERTION: EP1214

## 2017-06-23 LAB — GLUCOSE, CAPILLARY: Glucose-Capillary: 95 mg/dL (ref 65–99)

## 2017-06-23 SURGERY — LOOP RECORDER INSERTION
Anesthesia: LOCAL

## 2017-06-23 MED ORDER — LIDOCAINE-EPINEPHRINE (PF) 1 %-1:200000 IJ SOLN
INTRAMUSCULAR | Status: AC
  Filled 2017-06-23: qty 30

## 2017-06-23 SURGICAL SUPPLY — 2 items
LOOP REVEAL LINQSYS (Prosthesis & Implant Heart) ×2 IMPLANT
PACK LOOP INSERTION (CUSTOM PROCEDURE TRAY) ×2 IMPLANT

## 2017-06-23 NOTE — Progress Notes (Signed)
Patient tolerates procedure well. Reviewed discharge instructions including follow up appt and abx rx with patient and RN at [facility]. Awaiting [employer] rep arrival to interrogate/pair device. Patient resting quietly in stretcher. Will continue to monitor.

## 2017-06-24 ENCOUNTER — Other Ambulatory Visit: Payer: Self-pay | Admitting: *Deleted

## 2017-06-24 ENCOUNTER — Ambulatory Visit: Payer: Self-pay | Admitting: *Deleted

## 2017-06-24 ENCOUNTER — Encounter: Payer: Self-pay | Admitting: *Deleted

## 2017-06-24 DIAGNOSIS — J309 Allergic rhinitis, unspecified: Secondary | ICD-10-CM | POA: Diagnosis not present

## 2017-06-24 DIAGNOSIS — Z95818 Presence of other cardiac implants and grafts: Secondary | ICD-10-CM | POA: Diagnosis not present

## 2017-06-24 NOTE — Patient Outreach (Signed)
[facility]) Care Management  06/24/2017  MARGARET COCKERILL Jun 07, 1946 [id number]   Phone call to discharge planner Lurlean Horns at [facility] for an update on patient's rehab stay. Per Rodena Piety, patient has decided as of yesterday to remain at the facility under long term care.   Patient to be closed to [facility] care management at this time due to plan to remain at [facility] care under long term care.   Sheralyn Boatman [facility] Care Management [phone]

## 2017-06-24 NOTE — Patient Outreach (Signed)
Informed by [facility] LCSW today phone call to discharge planner at [facility] care pt to remain under long term care.  This RN CM to resolve episode, remove from care team.    Zara Chess.   [place] Care Management  [phone]

## 2017-06-25 DIAGNOSIS — J309 Allergic rhinitis, unspecified: Secondary | ICD-10-CM | POA: Diagnosis not present

## 2017-06-25 DIAGNOSIS — E119 Type 2 diabetes mellitus without complications: Secondary | ICD-10-CM | POA: Diagnosis not present

## 2017-06-25 DIAGNOSIS — M4856XD Collapsed vertebra, not elsewhere classified, lumbar region, subsequent encounter for fracture with routine healing: Secondary | ICD-10-CM | POA: Diagnosis not present

## 2017-06-25 DIAGNOSIS — M791 Myalgia, unspecified site: Secondary | ICD-10-CM | POA: Diagnosis not present

## 2017-06-28 ENCOUNTER — Ambulatory Visit: Payer: Medicare Other | Admitting: Family

## 2017-06-28 DIAGNOSIS — Z9889 Other specified postprocedural states: Secondary | ICD-10-CM | POA: Diagnosis not present

## 2017-06-29 DIAGNOSIS — J309 Allergic rhinitis, unspecified: Secondary | ICD-10-CM | POA: Diagnosis not present

## 2017-07-02 DIAGNOSIS — J309 Allergic rhinitis, unspecified: Secondary | ICD-10-CM | POA: Diagnosis not present

## 2017-07-02 DIAGNOSIS — E119 Type 2 diabetes mellitus without complications: Secondary | ICD-10-CM | POA: Diagnosis not present

## 2017-07-02 DIAGNOSIS — M4856XD Collapsed vertebra, not elsewhere classified, lumbar region, subsequent encounter for fracture with routine healing: Secondary | ICD-10-CM | POA: Diagnosis not present

## 2017-07-14 IMAGING — MR MR HEAD W/O CM
10 series · 41 of 48 positions shown · non-contrast
Comparison: CT head 09/08/2015

CLINICAL DATA: Cluster headache.  Brain tumor resection 0554

EXAM:
MRI HEAD WITHOUT CONTRAST
TECHNIQUE: Multiplanar, multiecho pulse sequences of the brain and surrounding
structures were obtained without intravenous contrast.

[Series 2: T1 · sagittal · 5.0mm · 0.45mm/px · 3 of 30 slices shown (1 of 2)]
[im 1/30]
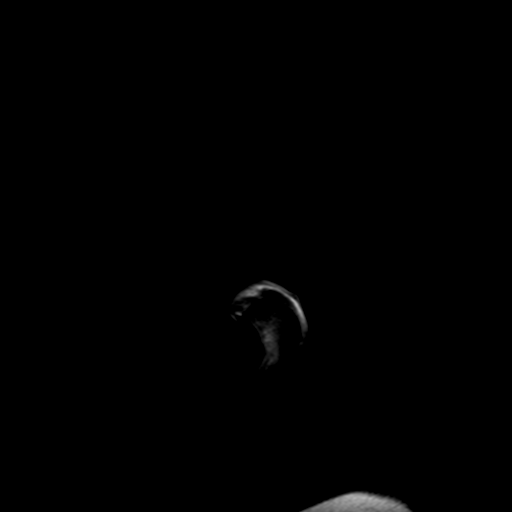
[im 15/30]
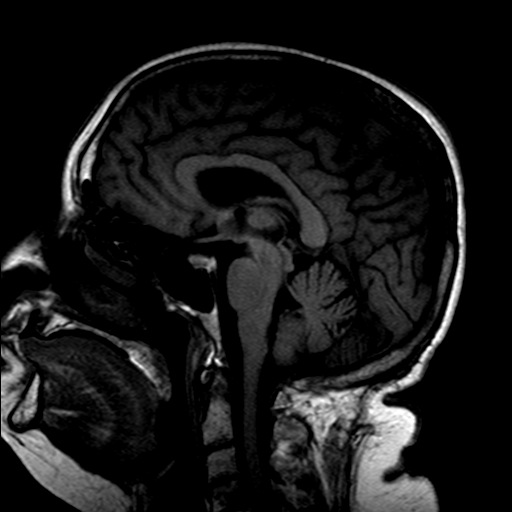
[im 30/30]
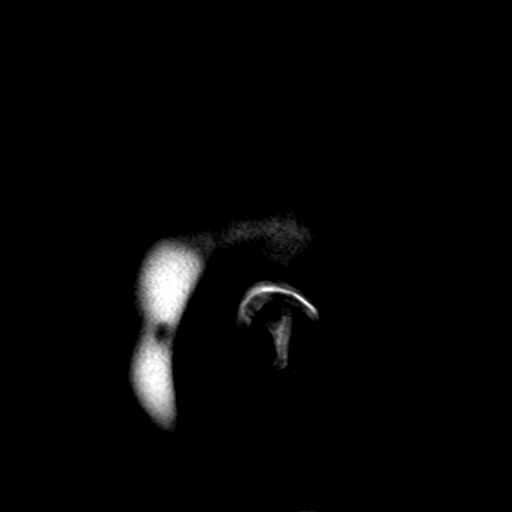

[Series 4: DWI · axial · 4.0mm · 0.94mm/px · z∈[-51,+98]mm · 5 of 41 slices shown (1 of 4)]
[im 1/41]
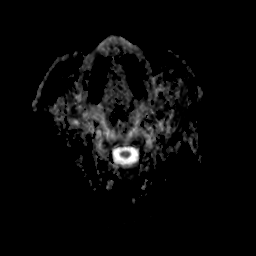
[im 11/41]
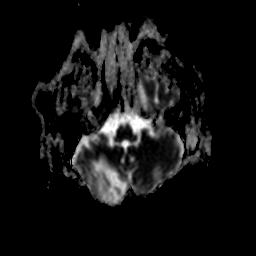
[im 21/41]
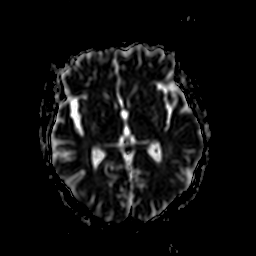
[im 31/41]
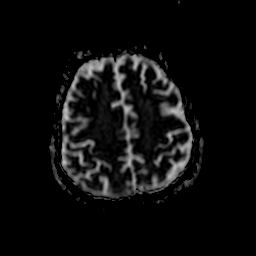
[im 41/41]
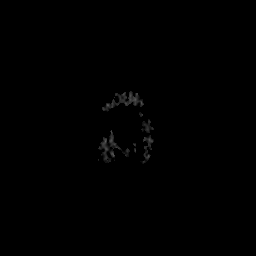

[Series 5: DWI · axial · 4.0mm · 0.94mm/px · z∈[-51,+94]mm · 6 of 40 slices shown (2 of 4)]
[im 1/40]
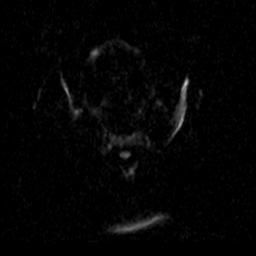
[im 8/40]
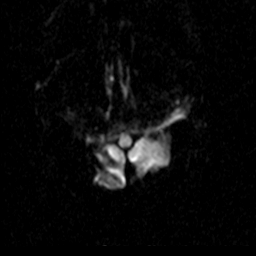
[im 16/40]
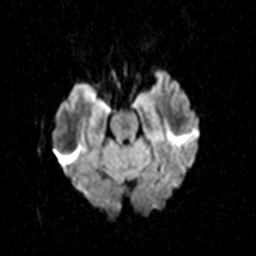
[im 24/40]
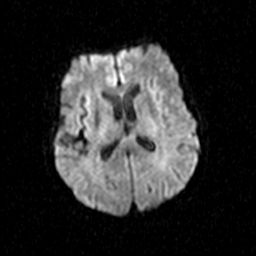
[im 32/40]
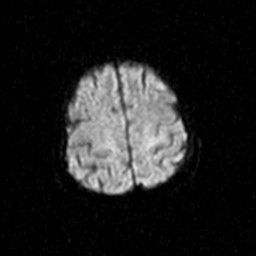
[im 40/40]
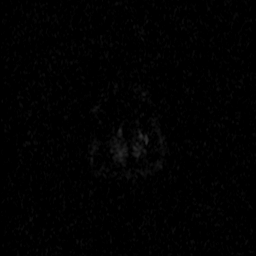

[Series 7: DWI · coronal · 5.0mm · 1.80mm/px · 5 of 37 slices shown (3 of 4)]
[im 1/37]
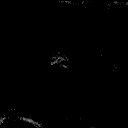
[im 10/37]
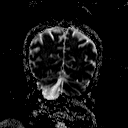
[im 19/37]
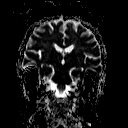
[im 28/37]
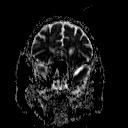
[im 37/37]
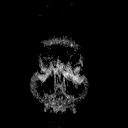

[Series 8: DWI · coronal · 5.0mm · 1.80mm/px · 5 of 37 slices shown (4 of 4)]
[im 1/37]
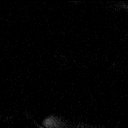
[im 10/37]
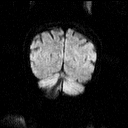
[im 19/37]
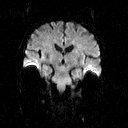
[im 28/37]
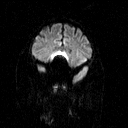
[im 37/37]
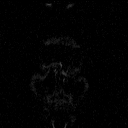

[Series 9: T2 · axial · 5.0mm · 0.45mm/px · z∈[-56,+96]mm · 4 of 26 slices shown (1 of 3)]
[im 1/26]
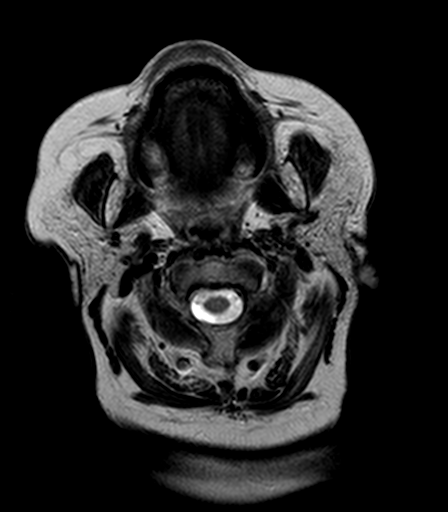
[im 9/26]
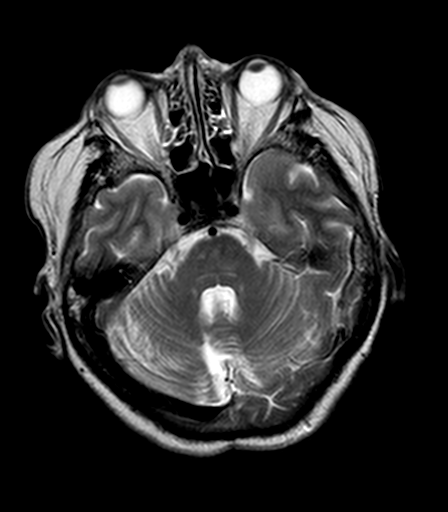
[im 17/26]
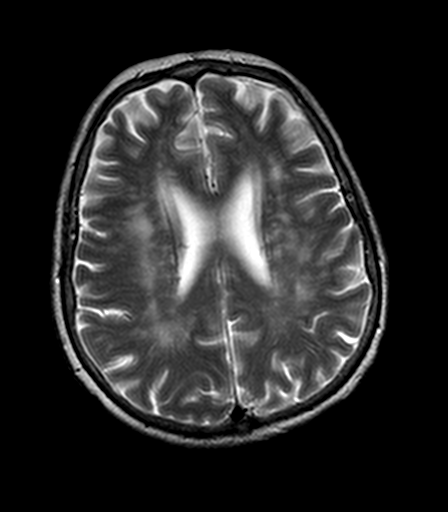
[im 26/26]
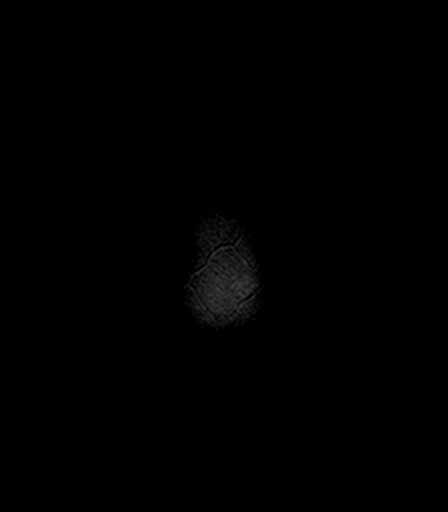

[Series 10: FLAIR · axial · 5.0mm · 0.90mm/px · z∈[-56,+95]mm · 4 of 26 slices shown]
[im 1/26]
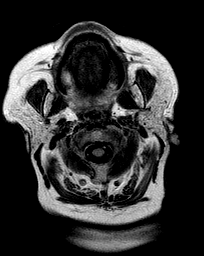
[im 9/26]
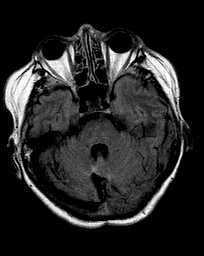
[im 17/26]
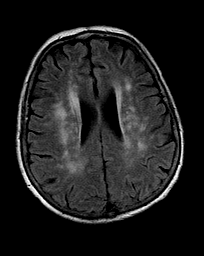
[im 26/26]
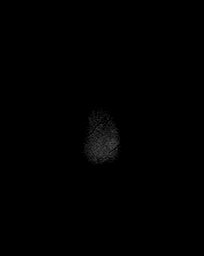

[Series 11: T2 · axial · 5.0mm · 0.45mm/px · z∈[-56,+96]mm · 4 of 26 slices shown (2 of 3)]
[im 1/26]
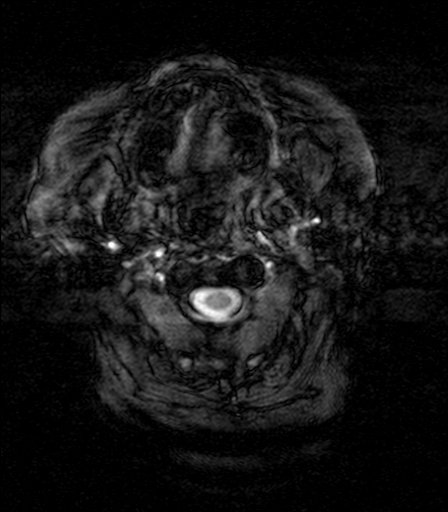
[im 9/26]
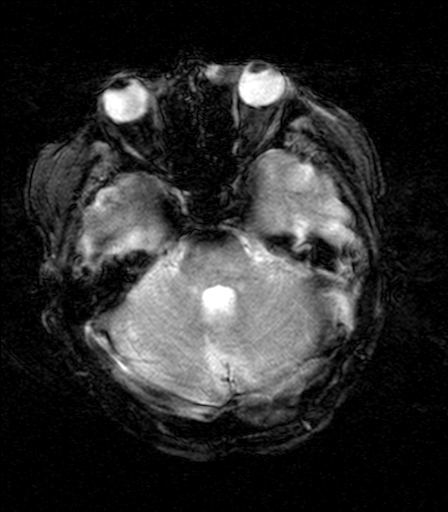
[im 17/26]
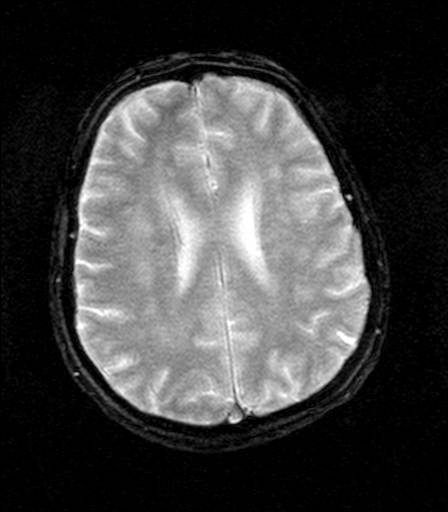
[im 26/26]
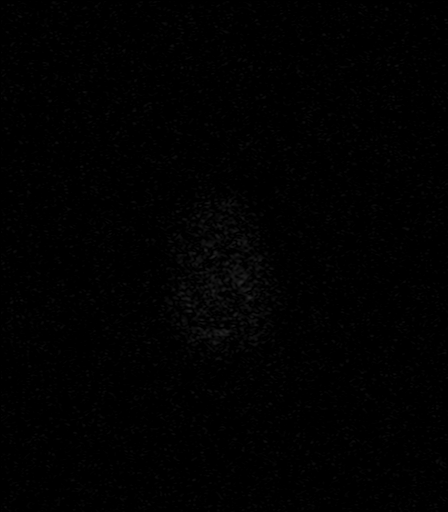

[Series 12: T1 · axial · 3.0mm · 0.45mm/px · 1 of 56 slices shown (2 of 2)]
[im 1/56]
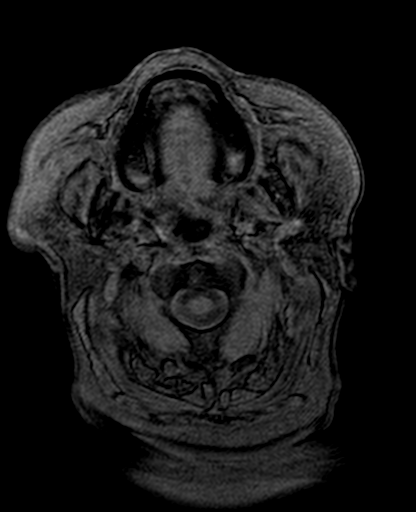

[Series 13: T2 · coronal · 5.0mm · 0.45mm/px · 4 of 28 slices shown (3 of 3)]
[im 1/28]
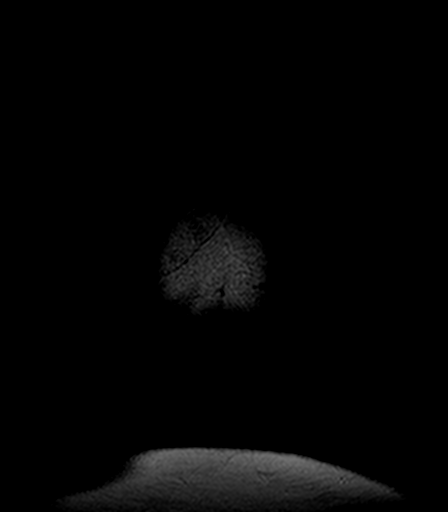
[im 10/28]
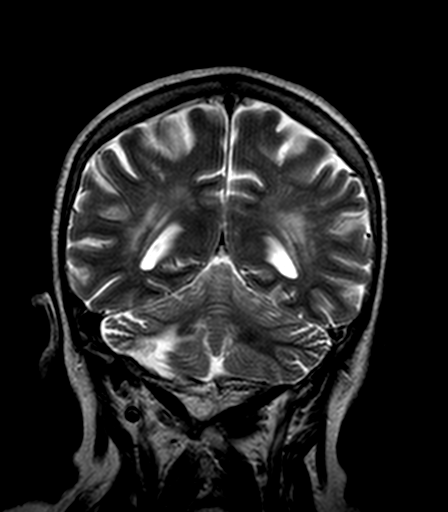
[im 19/28]
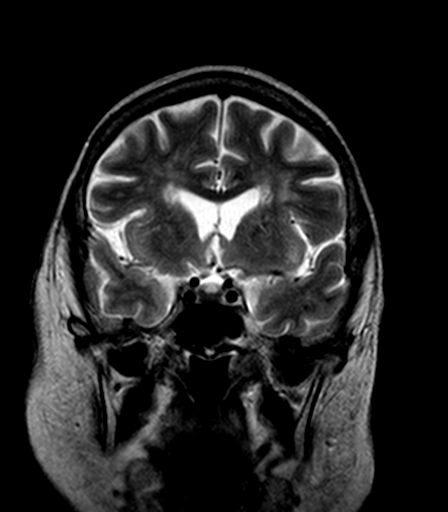
[im 28/28]
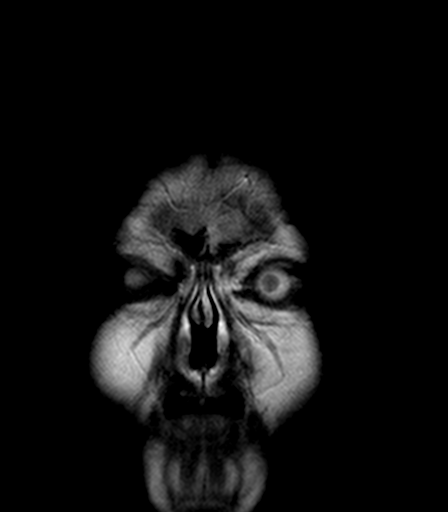

[41 of 48 positions shown; findings below may reference images not displayed]

FINDINGS: Right occipital craniotomy. Postop encephalomalacia right cerebellum
is stable.

Mild atrophy.  Negative for hydrocephalus

Negative for acute infarct. Moderate chronic microvascular ischemic
changes in the white matter. Mild chronic ischemia in the pons.

Negative for intracranial hemorrhage.  Negative for mass or edema.

Pituitary normal in size.  Normal base of skull.

Mucosal edema in the paranasal sinuses. Left mastoid sinus effusion.
Normal orbit.
IMPRESSION: Chronic microvascular ischemia.  No acute infarct

Stable postop encephalomalacia right cerebellum from prior
resection.

Sinus mucosal disease and left mastoid effusion.

## 2017-08-25 ENCOUNTER — Encounter: Payer: Self-pay | Admitting: Emergency Medicine

## 2017-08-25 ENCOUNTER — Inpatient Hospital Stay
Admission: EM | Admit: 2017-08-25 | Discharge: 2017-08-28 | DRG: 193 | Disposition: A | Discharge status: Skilled Nursing Facility | Payer: Medicare Other | Attending: Internal Medicine | Admitting: Internal Medicine

## 2017-08-25 ENCOUNTER — Emergency Department: Payer: Medicare Other

## 2017-08-25 ENCOUNTER — Other Ambulatory Visit: Payer: Self-pay

## 2017-08-25 DIAGNOSIS — E7800 Pure hypercholesterolemia, unspecified: Secondary | ICD-10-CM | POA: Diagnosis present

## 2017-08-25 DIAGNOSIS — Z96653 Presence of artificial knee joint, bilateral: Secondary | ICD-10-CM | POA: Diagnosis present

## 2017-08-25 DIAGNOSIS — F418 Other specified anxiety disorders: Secondary | ICD-10-CM | POA: Diagnosis present

## 2017-08-25 DIAGNOSIS — M359 Systemic involvement of connective tissue, unspecified: Secondary | ICD-10-CM | POA: Diagnosis present

## 2017-08-25 DIAGNOSIS — Y92129 Unspecified place in nursing home as the place of occurrence of the external cause: Secondary | ICD-10-CM

## 2017-08-25 DIAGNOSIS — E872 Acidosis: Secondary | ICD-10-CM | POA: Diagnosis present

## 2017-08-25 DIAGNOSIS — Z886 Allergy status to analgesic agent status: Secondary | ICD-10-CM

## 2017-08-25 DIAGNOSIS — M533 Sacrococcygeal disorders, not elsewhere classified: Secondary | ICD-10-CM | POA: Diagnosis present

## 2017-08-25 DIAGNOSIS — J9601 Acute respiratory failure with hypoxia: Secondary | ICD-10-CM | POA: Diagnosis present

## 2017-08-25 DIAGNOSIS — I5040 Unspecified combined systolic (congestive) and diastolic (congestive) heart failure: Secondary | ICD-10-CM | POA: Diagnosis present

## 2017-08-25 DIAGNOSIS — Y95 Nosocomial condition: Secondary | ICD-10-CM | POA: Diagnosis present

## 2017-08-25 DIAGNOSIS — Z66 Do not resuscitate: Secondary | ICD-10-CM | POA: Diagnosis present

## 2017-08-25 DIAGNOSIS — M810 Age-related osteoporosis without current pathological fracture: Secondary | ICD-10-CM | POA: Diagnosis present

## 2017-08-25 DIAGNOSIS — J181 Lobar pneumonia, unspecified organism: Secondary | ICD-10-CM | POA: Diagnosis not present

## 2017-08-25 DIAGNOSIS — Z880 Allergy status to penicillin: Secondary | ICD-10-CM

## 2017-08-25 DIAGNOSIS — J440 Chronic obstructive pulmonary disease with acute lower respiratory infection: Secondary | ICD-10-CM | POA: Diagnosis present

## 2017-08-25 DIAGNOSIS — W19XXXA Unspecified fall, initial encounter: Secondary | ICD-10-CM | POA: Diagnosis present

## 2017-08-25 DIAGNOSIS — E119 Type 2 diabetes mellitus without complications: Secondary | ICD-10-CM | POA: Diagnosis present

## 2017-08-25 DIAGNOSIS — I110 Hypertensive heart disease with heart failure: Secondary | ICD-10-CM | POA: Diagnosis present

## 2017-08-25 DIAGNOSIS — G9341 Metabolic encephalopathy: Secondary | ICD-10-CM | POA: Diagnosis present

## 2017-08-25 DIAGNOSIS — E669 Obesity, unspecified: Secondary | ICD-10-CM | POA: Diagnosis present

## 2017-08-25 DIAGNOSIS — J189 Pneumonia, unspecified organism: Secondary | ICD-10-CM | POA: Diagnosis present

## 2017-08-25 DIAGNOSIS — Z86011 Personal history of benign neoplasm of the brain: Secondary | ICD-10-CM

## 2017-08-25 DIAGNOSIS — R410 Disorientation, unspecified: Secondary | ICD-10-CM

## 2017-08-25 DIAGNOSIS — Z6829 Body mass index (BMI) 29.0-29.9, adult: Secondary | ICD-10-CM

## 2017-08-25 DIAGNOSIS — Z79899 Other long term (current) drug therapy: Secondary | ICD-10-CM

## 2017-08-25 DIAGNOSIS — M79652 Pain in left thigh: Secondary | ICD-10-CM

## 2017-08-25 DIAGNOSIS — Z888 Allergy status to other drugs, medicaments and biological substances status: Secondary | ICD-10-CM

## 2017-08-25 DIAGNOSIS — K219 Gastro-esophageal reflux disease without esophagitis: Secondary | ICD-10-CM | POA: Diagnosis present

## 2017-08-25 DIAGNOSIS — F4310 Post-traumatic stress disorder, unspecified: Secondary | ICD-10-CM | POA: Diagnosis present

## 2017-08-25 DIAGNOSIS — I2510 Atherosclerotic heart disease of native coronary artery without angina pectoris: Secondary | ICD-10-CM | POA: Diagnosis present

## 2017-08-25 DIAGNOSIS — E860 Dehydration: Secondary | ICD-10-CM | POA: Diagnosis present

## 2017-08-25 DIAGNOSIS — Z7952 Long term (current) use of systemic steroids: Secondary | ICD-10-CM

## 2017-08-25 LAB — URINALYSIS, COMPLETE (UACMP) WITH MICROSCOPIC
BACTERIA UA: NONE SEEN
BILIRUBIN URINE: NEGATIVE
GLUCOSE, UA: NEGATIVE mg/dL
Hgb urine dipstick: NEGATIVE
KETONES UR: NEGATIVE mg/dL
Leukocytes, UA: NEGATIVE
NITRITE: NEGATIVE
PH: 6 (ref 5.0–8.0)
PROTEIN: NEGATIVE mg/dL
RBC / HPF: NONE SEEN RBC/hpf (ref 0–5)
Specific Gravity, Urine: 1.015 (ref 1.005–1.030)
Squamous Epithelial / LPF: NONE SEEN

## 2017-08-25 LAB — COMPREHENSIVE METABOLIC PANEL
ALBUMIN: 2.9 g/dL — AB (ref 3.5–5.0)
ALK PHOS: 134 U/L — AB (ref 38–126)
ALT: 22 U/L (ref 14–54)
ANION GAP: 10 (ref 5–15)
AST: 26 U/L (ref 15–41)
BUN: 23 mg/dL — ABNORMAL HIGH (ref 6–20)
CALCIUM: 8.7 mg/dL — AB (ref 8.9–10.3)
CO2: 26 mmol/L (ref 22–32)
Chloride: 103 mmol/L (ref 101–111)
Creatinine, Ser: 0.8 mg/dL (ref 0.44–1.00)
GFR calc non Af Amer: >60 mL/min (ref >60)
GLUCOSE: 123 mg/dL — AB (ref 65–99)
POTASSIUM: 4.5 mmol/L (ref 3.5–5.1)
SODIUM: 139 mmol/L (ref 135–145)
TOTAL PROTEIN: 6.6 g/dL (ref 6.5–8.1)
Total Bilirubin: 1.2 mg/dL (ref 0.3–1.2)

## 2017-08-25 LAB — CBC
HEMATOCRIT: 33.7 % — AB (ref 35.0–47.0)
HEMOGLOBIN: 10.7 g/dL — AB (ref 12.0–16.0)
MCH: 27.1 pg (ref 26.0–34.0)
MCHC: 31.9 g/dL — ABNORMAL LOW (ref 32.0–36.0)
MCV: 85.1 fL (ref 80.0–100.0)
Platelets: 268 10*3/uL (ref 150–440)
RBC: 3.96 MIL/uL (ref 3.80–5.20)
RDW: 15 % — ABNORMAL HIGH (ref 11.5–14.5)
WBC: 13.8 10*3/uL — ABNORMAL HIGH (ref 3.6–11.0)

## 2017-08-25 NOTE — ED Triage Notes (Signed)
Patient to ER from [facility] via [facility] for c/o AMS and possible UTI. Patient is normally verbal, has not been as verbal and responsive today. Was complaining of burning sensation with urination to facility staff earlier today. Patient is completely nonverbal upon arrival to ER. Patient had urinalysis at facility today, but results have not been received as of yet. Patient responsive to pain.

## 2017-08-25 NOTE — ED Provider Notes (Signed)
[facility] Emergency Department Provider Note  ____________________________________________   First MD Initiated Contact with Patient 08/25/17 2255     (approximate)  I have reviewed the triage vital signs and the nursing notes.   HISTORY  Chief Complaint Altered Mental Status  Level 5 caveat:  history/ROS limited by altered mental status/confusion   HPI Meghan [name] is a 72 y.o. female with medical history as listed below that does not specifically include dementia, but does include geropsychiatric issues of bipolar disorder.  She presents by EMS from her health care facility for altered mental status.  They reported that normally she is verbal but has not been communicative today and is slower to respond in general.  Earlier today she reportedly told the staff that she was having a burning sensation with urination.  Initially upon arrival to the emergency department she is nonverbal although she is awake and seems alert.  Subsequently she did start speaking again and when I saw her she communicated with me without any difficulty.  Her only complaint is pain in her left inner thigh.  She states that she had a fall earlier today but it is unclear if this actually happened.  She does have a history of falls but there was no report of fall from the facility and there is no evidence of acute injury, just the report of pain in her leg which she describes as severe if she moves her leg or if someone touches it.  She denies headache, neck pain, shortness of breath, chest pain, nausea, vomiting, and abdominal pain.  She states that she does think she remembers it burning when she urinated but she is not certain.  Nothing in particular makes her symptoms better.  Past Medical History:  Diagnosis Date  . Anginal pain ([place])   . Anxiety   . Arthritis    RA  . Asthma   . Brain tumor (benign) ([place])   . CHF (congestive heart failure) ([place])   . Collagen vascular disease  ([place])   . COPD (chronic obstructive pulmonary disease) ([place])   . Coronary artery disease   . DDD (degenerative disc disease)   . Depression   . Diabetes mellitus   . GERD (gastroesophageal reflux disease)   . Headache   . Heart murmur   . Heart murmur   . Hypercholesteremia   . Hypertension   . Lumbar degenerative disc disease   . Migraines   . Obesity   . Osteopenia   . Osteoporosis   . Pneumonia   . PTSD (post-traumatic stress disorder)     Patient Active Problem List   Diagnosis Date Noted  . Syncope 05/05/2017  . Acute on chronic systolic CHF (congestive heart failure) ([place]) 04/16/2017  . Acute lower UTI 04/16/2017  . Acute respiratory failure ([place]) 04/16/2017  . Pressure injury of skin 03/22/2017  . HCAP (healthcare-associated pneumonia) 03/20/2017  . Acute on chronic respiratory failure with hypoxia ([place]) 03/20/2017  . Acute respiratory failure with hypoxia ([place]) 03/20/2017  . Near syncope 02/27/2017  . Dehydration 02/27/2017  . Colitis 02/22/2017  . Seizure ([place]) 01/03/2016  . Chronic tension-type headache, intractable 12/04/2015  . 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 Degeneration of intervertebral disc of lumbar region 07/15/2015  . 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 Asthma with acute exacerbation 03/10/2015  . Hypokalemia 03/01/2015  . Hyponatremia 03/01/2015  . DDD (degenerative disc disease), lumbar 01/31/2015  . Arthritis, degenerative 01/31/2015  . Rheumatoid arthritis with rheumatoid factor (  [place]) 01/31/2015  . HTN (hypertension) 12/24/2014  . Sepsis ([place]) 12/24/2014  . Left knee pain 12/24/2014  . GERD (gastroesophageal reflux disease) 12/24/2014  . COPD (chronic obstructive pulmonary disease) ([place]) 12/24/2014  . Depression 12/24/2014  . Anxiety 12/24/2014  . Severe bipolar disorder with psychotic features, mood-congruent ([place]) 11/16/2014  . Neurosis, posttraumatic 11/16/2014  . H/O gastric ulcer 11/16/2014  . Barton's fracture  of distal radius, closed 09/19/2014  . Neuritis or radiculitis due to rupture of lumbar intervertebral disc 05/11/2014  . Cervico-occipital neuralgia 03/26/2014  . Difficulty in walking 03/26/2014  . Difficulty in walking, not elsewhere classified 03/26/2014  . Cervical spine syndrome 01/26/2014  . Cephalalgia 01/09/2014  . Disordered sleep 01/09/2014  . BP (high blood pressure) 10/19/2013  . Adiposity 10/19/2013  . Cardiac murmur 10/19/2013  . PNA (pneumonia) 10/19/2013  . Breath shortness 10/19/2013  . Chronic obstructive pulmonary disease ([place]) 10/19/2013  . Diabetes mellitus ([place]) 10/19/2013    Past Surgical History:  Procedure Laterality Date  . ABDOMINAL HYSTERECTOMY    . CERVICAL FUSION    . CHOLECYSTECTOMY    . COLONOSCOPY WITH PROPOFOL N/A 09/20/2015   Procedure: COLONOSCOPY WITH PROPOFOL;  Surgeon: Josefine Class, MD;  Location: [facility] ENDOSCOPY;  Service: Endoscopy;  Laterality: N/A;  . COLONOSCOPY WITH PROPOFOL N/A 01/27/2016   Procedure: COLONOSCOPY WITH PROPOFOL;  Surgeon: Manya Silvas, MD;  Location[facility] ENDOSCOPY;  Service: Endoscopy;  Laterality: N/A;  . FINGER ARTHROPLASTY  02/23/2012   Procedure: FINGER ARTHROPLASTY;  Surgeon: Cammie Sickle., MD;  Location: [place];  Service: Orthopedics;  Laterality: Left;  Extensor carpi radialis longus to Extensor carpi ulnaris transfer, left Metaphalangeal reconstructions of index and long fingers,  . FOOT ARTHROPLASTY     toes x2 rt foot  . HAND RECONSTRUCTION  2011   right-multiple finger joint reconst  . JOINT REPLACEMENT     bilat knee replacements  . KYPHOPLASTY N/A 06/14/2017   Procedure: KYPHOPLASTY L1;  Surgeon: Hessie Knows, MD;  Location: [facility] ORS;  Service: Orthopedics;  Laterality: N/A;  . LOOP RECORDER INSERTION N/A 06/23/2017   Procedure: LOOP RECORDER INSERTION;  Surgeon: Isaias Cowman, MD;  Location: [place] [facility];  Service: Cardiovascular;  Laterality: N/A;     Prior to Admission medications   Medication Sig Start Date End Date Taking? Authorizing Provider  busPIRone (BUSPAR) 7.5 MG tablet Take 1 tablet by mouth 2 (two) times daily.   Yes [provider]  Cholecalciferol 5000 units TABS Take 5,000 Units by mouth every 30 (thirty) days.   Yes [provider]  clonazePAM (KLONOPIN) 0.5 MG tablet Take 0.125 mg by mouth 2 (two) times daily.   Yes [provider]  Cyanocobalamin (VITAMIN B 12 PO) Take 500 mg by mouth daily.   Yes [provider]  diclofenac sodium (VOLTAREN) 1 % GEL Apply 2 g topically 3 (three) times daily. Apply to the fingers   Yes [provider]  diclofenac sodium (VOLTAREN) 1 % GEL Apply 4 g topically 4 (four) times daily. Apply to the left knee   Yes [provider]  diphenoxylate-atropine (LOMOTIL) 2.5-0.025 MG tablet Take 1 tablet by mouth every 6 (six) hours as needed for diarrhea or loose stools.    Yes [provider]  escitalopram (LEXAPRO) 10 MG tablet Take 1 tablet (10 mg total) by mouth every morning. 09/11/16  Yes Rainey Pines, MD  fluticasone (VERAMYST) 27.5 MCG/SPRAY nasal spray Place 2 sprays into the nose 2 (  two) times daily.   Yes [provider]  folic acid (FOLVITE) 1 MG tablet Take 1 mg by mouth daily.   Yes [provider]  furosemide (LASIX) 40 MG tablet Take 40 mg by mouth 2 (two) times daily.   Yes [provider]  guaiFENesin (ROBITUSSIN) 100 MG/5ML SOLN Take 20 mLs by mouth every 6 (six) hours. 08/20/17 09/03/17 Yes [provider]  Ipratropium-Albuterol (COMBIVENT RESPIMAT) 20-100 MCG/ACT AERS respimat Inhale 2 puffs into the lungs every 6 (six) hours as needed for wheezing.   Yes [provider]  Ipratropium-Albuterol (COMBIVENT RESPIMAT) 20-100 MCG/ACT AERS respimat Inhale 2 puffs into the lungs every 6 (six) hours.   Yes [provider]  lamoTRIgine (LAMICTAL) 100 MG tablet Take 1 tablet (100  mg total) by mouth daily. 06/02/16  Yes Rainey Pines, MD  lisinopril (PRINIVIL,ZESTRIL) 2.5 MG tablet Take 1 tablet by mouth 2 (two) times daily.   Yes [provider]  loperamide (IMODIUM A-D) 2 MG tablet Take 1 tablet (2 mg total) by mouth 4 (four) times daily as needed for diarrhea or loose stools. Patient taking differently: Take 2 mg by mouth as needed for diarrhea or loose stools.  05/10/17  Yes Eula Listen, MD  loratadine (CLARITIN) 10 MG tablet Take 10 mg by mouth daily.   Yes [provider]  methotrexate (RHEUMATREX) 7.5 MG tablet Take 7.5 mg by mouth once a week. Caution" Chemotherapy. Protect from light.   Yes [provider]  metoprolol tartrate (LOPRESSOR) 25 MG tablet Take 25 mg by mouth 2 (two) times daily.   Yes [provider]  mirtazapine (REMERON) 7.5 MG tablet Take 7.5 mg by mouth at bedtime.    Yes [provider]  montelukast (SINGULAIR) 10 MG tablet Take 10 mg by mouth daily.   Yes [provider]  omeprazole (PRILOSEC) 20 MG capsule Take 20 mg by mouth daily.    Yes [provider]  oxyCODONE-acetaminophen (PERCOCET/ROXICET) 5-325 MG tablet Take 1 tablet every 4 (four) hours as needed by mouth for moderate pain or severe pain. 06/15/17  Yes Henreitta Leber, MD  Potassium Chloride ER 20 MEQ TBCR Take 20 mEq by mouth 2 (two) times daily. 05/07/17  Yes Vaughan Basta, MD  pramipexole (MIRAPEX) 0.25 MG tablet Take 0.25 mg by mouth daily.    Yes [provider]  predniSONE (DELTASONE) 5 MG tablet Take 5 mg by mouth daily with breakfast.   Yes [provider]  pregabalin (LYRICA) 50 MG capsule TAKE ONE CAPSULE BY MOUTH TWICE A DAY 06/18/15  Yes [provider]  promethazine (PHENERGAN) 25 MG suppository Place 1 suppository (25 mg total) rectally every 6 (six) hours as needed for nausea. 05/10/17 05/10/18 Yes Eula Listen, MD  saxagliptin HCl (ONGLYZA) 5 MG TABS tablet  Take 5 mg by mouth at bedtime.   Yes [provider]  Tofacitinib Citrate 5 MG TABS Take 5 mg by mouth 2 (two) times daily after a meal.    Yes [provider]  topiramate (TOPAMAX) 25 MG tablet Take 25 mg by mouth at bedtime.    Yes [provider]    Allergies Amoxicillin; Gabapentin; Naproxen; and Nsaids  Family History  Problem Relation Age of Onset  . Depression Sister   . Migraines Sister   . Hypertension Mother   . Arthritis/Rheumatoid Mother   . Heart attack Father   . Hypertension Father   . CAD Unknown   . Hypertension Unknown   .  Diabetes Mellitus II Unknown   . Arthritis Unknown     Social History Social History   Tobacco Use  . Smoking status: Never Smoker  . Smokeless tobacco: Never Used  Substance Use Topics  . Alcohol use: No    Alcohol/week: 0.0 oz  . Drug use: No    Review of Systems Level 5 caveat:  history/ROS limited by altered mental status/confusion.  See HPI above for details.   ____________________________________________   PHYSICAL EXAM:  VITAL SIGNS: ED Triage Vitals  Enc Vitals Group     BP 08/25/17 2235 112/79     Pulse Rate 08/25/17 2235 79     Resp 08/25/17 2235 20     Temp 08/25/17 2235 98.9 F (37.2 C)     Temp Source 08/25/17 2235 Oral     SpO2 08/25/17 2230 95 %     Weight 08/25/17 2236 56.2 kg (124 lb)     Height 08/25/17 2236 1.422 m (4\' 8" )     Head Circumference --      Peak Flow --      Pain Score --      Pain Loc --      Pain Edu? --      Excl. in [place]? --     Constitutional: Alert and oriented to person and situation.  No acute distress Eyes: Conjunctivae are normal. EOMI. Head: Atraumatic. Nose: No congestion/rhinnorhea. Mouth/Throat: Mucous membranes are moist. Neck: No stridor.  No meningeal signs.  No cervical spine tenderness to palpation. Cardiovascular: Normal rate, regular rhythm. Good peripheral circulation. Grossly normal heart sounds. Respiratory: Normal respiratory  effort.  No retractions. Lungs CTAB. Gastrointestinal: Soft and nontender. No distention.  Musculoskeletal: The patient has no gross deformities to her extremities including no obvious emesis or contusion.  However she has a significant amount of tenderness to palpation of her proximal left leg.  This includes both passive range of motion and palpation.  There is no erythema nor other rash evident and no evidence of injury externally.  She has no peripheral edema. Neurologic:  Normal speech and language. No gross focal neurologic deficits are appreciated.  Skin:  Skin is warm, dry and intact. No rash noted. Psychiatric: Mood and affect are normal. Speech and behavior are normal.  ____________________________________________   LABS (all labs ordered are listed, but only abnormal results are displayed)  Labs Reviewed  COMPREHENSIVE METABOLIC PANEL - Abnormal; Notable for the following components:      Result Value   Glucose, Bld 123 (*)    BUN 23 (*)    Calcium 8.7 (*)    Albumin 2.9 (*)    Alkaline Phosphatase 134 (*)    All other components within normal limits  CBC - Abnormal; Notable for the following components:   WBC 13.8 (*)    Hemoglobin 10.7 (*)    HCT 33.7 (*)    MCHC 31.9 (*)    RDW 15.0 (*)    All other components within normal limits  URINALYSIS, COMPLETE (UACMP) WITH MICROSCOPIC - Abnormal; Notable for the following components:   Color, Urine YELLOW (*)    APPearance CLEAR (*)    All other components within normal limits  CULTURE, BLOOD (ROUTINE X 2)  CULTURE, BLOOD (ROUTINE X 2)  LACTIC ACID, PLASMA  LACTIC ACID, PLASMA   ____________________________________________  EKG  ED ECG REPORT I, Hinda Kehr, the attending physician, personally viewed and interpreted this ECG.  Date: 08/25/2017 EKG Time: 22: 34 Rate: 79 Rhythm: normal sinus  rhythm with nonspecific intraventricular conduction delay QRS Axis: Left axis deviation Intervals: LVH, intervals  normal ST/T Wave abnormalities: Non-specific ST segment / T-wave changes, but no evidence of acute ischemia. Narrative Interpretation: no evidence of acute ischemia   ____________________________________________  RADIOLOGY   Dg Chest 2 View  Result Date: 08/26/2017 CLINICAL DATA:  72 year old female with cough and leukocytosis. Altered mental status EXAM: CHEST  2 VIEW COMPARISON:  Chest radiograph dated 06/09/2017 FINDINGS: There is eventration of the right hemidiaphragm. Bibasilar densities, increased compared to the prior radiograph, likely atelectatic changes. Infiltrate is not excluded. Clinical correlation is recommended. Trace pleural effusions may be present. There is no pneumothorax. Stable top-normal cardiac size. A loop recorder device is noted. Osteopenia with degenerative changes of the spine. Upper lumbar vertebroplasty changes. Right upper quadrant cholecystectomy clips. IMPRESSION: Bibasilar densities increased from prior study, likely atelectatic changes. Pneumonia not excluded. Clinical correlation is recommended. Electronically Signed   By: Anner Crete M.D.   On: 08/26/2017 02:59   Ct Head Wo Contrast  Result Date: 08/25/2017 CLINICAL DATA:  72 y/o F; altered mental status and possible urinary tract infection. EXAM: CT HEAD WITHOUT CONTRAST TECHNIQUE: Contiguous axial images were obtained from the base of the skull through the vertex without intravenous contrast. COMPARISON:  06/09/2017 CT head FINDINGS: Brain: Stable encephalomalacia within the right cerebellar hemisphere subjacent to craniotomy. Stable mild chronic microvascular ischemic changes and parenchymal volume loss of the brain. No findings for large acute stroke, hemorrhage, or focal mass effect. No hydrocephalus, extra-axial collection, or effacement of basilar cisterns. Vascular: Calcific atherosclerosis of carotid siphons. Skull: Stable chronic postsurgical changes related to right suboccipital craniectomy.  Sinuses/Orbits: Increased opacification of the left mastoid air cells. Stable minimal right mastoid opacification. Normal aeration of visualized paranasal sinuses. Orbits are unremarkable. Other: None. IMPRESSION: 1. No acute intracranial abnormality identified. 2. Stable mild chronic microvascular ischemic changes and parenchymal volume loss of the brain. 3. Stable postsurgical changes related to right suboccipital craniotomy and right cerebellar hemisphere encephalomalacia. 4. Increased opacification of left mastoid air cells. Electronically Signed   By: Kristine Garbe M.D.   On: 08/25/2017 23:54   Dg Hip Unilat W Or Wo Pelvis 2-3 Views Left  Result Date: 08/25/2017 CLINICAL DATA:  72 year old female with left hip pain. EXAM: DG HIP (WITH OR WITHOUT PELVIS) 2-3V LEFT COMPARISON:  None. FINDINGS: There is no fracture or dislocation. The bones are osteopenic. Degenerative changes of the lower lumbar spine. Vascular calcification and multiple surgical clips over the pelvis. The soft tissues are grossly unremarkable. IMPRESSION: No acute fracture or dislocation. Electronically Signed   By: Anner Crete M.D.   On: 08/25/2017 23:46    ____________________________________________   PROCEDURES  Critical Care performed: No   Procedure(s) performed:   Procedures   ____________________________________________   INITIAL IMPRESSION / ASSESSMENT AND PLAN / ED COURSE  As part of my medical decision making, I reviewed the following data within the [place] notes reviewed and incorporated, Labs reviewed , EKG interpreted  and [place] chart reviewed    Differential diagnosis includes, but is not limited to, alcohol, illicit or prescription medications, or other toxic ingestion; intracranial pathology such as stroke or intracerebral hemorrhage; fever or infectious causes including sepsis; hypoxemia and/or hypercarbia; uremia; trauma; endocrine related disorders such  as diabetes, hypoglycemia, and thyroid-related diseases; hypertensive encephalopathy; etc.  Based on the history of present illness I think it is most likely she has some degree of delirium secondary to urinary tract  infection.  Basic labs including urinalysis are pending at this time.  There is no evidence of any acute abnormality on EKG.  I will obtain radiographs of the left hip which will include the area of tenderness on her proximal left leg to make sure that she does not have any bony injury but currently there is no evidence of an acute fall.  Her oxygen level is in the low 90s but she has a well-documented history of COPD has had 9 visits to the emergency department in the last 6 months with 6 admissions, typically for respiratory issues and/or metabolic abnormalities (her last admission was for a fall and hypokalemia).  I will reassess after the workup is complete but at this point she is in no acute distress status seems to have improved significantly.  If urinalysis is unremarkable I will obtain a CT scan of the head to rule out any acute intracranial abnormality.  Clinical Course as of Aug 26 524  Thu Aug 26, 2017  0041 No acute fractures/dislocations. DG Hip Unilat W or Wo Pelvis 2-3 Views Left [CF]  0041 No acute abnormalities CT Head Wo Contrast [CF]  0331 I ordered a chest x-ray given the fact that the patient has a leukocytosis, a lower than anticipated blood pressure, and reportedly has been coughing.  I was looking for any other sign of infection that would lead to her delirium and leukocytosis.  She has what appears to be worsening bibasilar opacities and in this clinical setting I feel it is appropriate to treat her for healthcare associated pneumonia as it is the most likely explanation for her delirium and leukocytosis.  I will initiate treatment including a lactic acid but I do not feel that she meets criteria certainly for severe sepsis, and she actually does not meet septic  criteria either based on her vital signs.  I will discuss the case with the hospitalist.  [CF]    Clinical Course User Index [CF] Hinda Kehr, MD    ____________________________________________  FINAL CLINICAL IMPRESSION(S) / ED DIAGNOSES  Final diagnoses:  Delirium  Healthcare-associated pneumonia  Acute pain of left thigh     MEDICATIONS GIVEN DURING THIS VISIT:  Medications  vancomycin (VANCOCIN) IVPB 1000 mg/200 mL premix (1,000 mg Intravenous New Bag/Given 08/26/17 0510)  sodium chloride 0.9 % bolus 1,000 mL (1,000 mLs Intravenous New Bag/Given 08/26/17 0435)  ceFEPIme (MAXIPIME) 2 g in dextrose 5 % 50 mL IVPB (0 g Intravenous Stopped 08/26/17 0509)     ED Discharge Orders    None       Note:  This document was prepared using Dragon voice recognition software and may include unintentional dictation errors.    Hinda Kehr, MD 08/26/17 [phone]

## 2017-08-26 ENCOUNTER — Emergency Department: Payer: Medicare Other

## 2017-08-26 ENCOUNTER — Encounter: Payer: Self-pay | Admitting: Internal Medicine

## 2017-08-26 DIAGNOSIS — E669 Obesity, unspecified: Secondary | ICD-10-CM | POA: Diagnosis present

## 2017-08-26 DIAGNOSIS — Z86011 Personal history of benign neoplasm of the brain: Secondary | ICD-10-CM | POA: Diagnosis not present

## 2017-08-26 DIAGNOSIS — Y95 Nosocomial condition: Secondary | ICD-10-CM | POA: Diagnosis present

## 2017-08-26 DIAGNOSIS — J9601 Acute respiratory failure with hypoxia: Secondary | ICD-10-CM | POA: Diagnosis present

## 2017-08-26 DIAGNOSIS — E119 Type 2 diabetes mellitus without complications: Secondary | ICD-10-CM | POA: Diagnosis present

## 2017-08-26 DIAGNOSIS — J181 Lobar pneumonia, unspecified organism: Secondary | ICD-10-CM | POA: Diagnosis present

## 2017-08-26 DIAGNOSIS — E7800 Pure hypercholesterolemia, unspecified: Secondary | ICD-10-CM | POA: Diagnosis present

## 2017-08-26 DIAGNOSIS — F4310 Post-traumatic stress disorder, unspecified: Secondary | ICD-10-CM | POA: Diagnosis present

## 2017-08-26 DIAGNOSIS — I5040 Unspecified combined systolic (congestive) and diastolic (congestive) heart failure: Secondary | ICD-10-CM | POA: Diagnosis present

## 2017-08-26 DIAGNOSIS — Z886 Allergy status to analgesic agent status: Secondary | ICD-10-CM | POA: Diagnosis not present

## 2017-08-26 DIAGNOSIS — M79652 Pain in left thigh: Secondary | ICD-10-CM | POA: Diagnosis present

## 2017-08-26 DIAGNOSIS — K219 Gastro-esophageal reflux disease without esophagitis: Secondary | ICD-10-CM | POA: Diagnosis present

## 2017-08-26 DIAGNOSIS — Y92129 Unspecified place in nursing home as the place of occurrence of the external cause: Secondary | ICD-10-CM | POA: Diagnosis not present

## 2017-08-26 DIAGNOSIS — Z96653 Presence of artificial knee joint, bilateral: Secondary | ICD-10-CM | POA: Diagnosis present

## 2017-08-26 DIAGNOSIS — G9341 Metabolic encephalopathy: Secondary | ICD-10-CM | POA: Diagnosis present

## 2017-08-26 DIAGNOSIS — I110 Hypertensive heart disease with heart failure: Secondary | ICD-10-CM | POA: Diagnosis present

## 2017-08-26 DIAGNOSIS — E860 Dehydration: Secondary | ICD-10-CM | POA: Diagnosis present

## 2017-08-26 DIAGNOSIS — I2510 Atherosclerotic heart disease of native coronary artery without angina pectoris: Secondary | ICD-10-CM | POA: Diagnosis present

## 2017-08-26 DIAGNOSIS — Z66 Do not resuscitate: Secondary | ICD-10-CM | POA: Diagnosis present

## 2017-08-26 DIAGNOSIS — F418 Other specified anxiety disorders: Secondary | ICD-10-CM | POA: Diagnosis present

## 2017-08-26 DIAGNOSIS — M359 Systemic involvement of connective tissue, unspecified: Secondary | ICD-10-CM | POA: Diagnosis present

## 2017-08-26 DIAGNOSIS — M810 Age-related osteoporosis without current pathological fracture: Secondary | ICD-10-CM | POA: Diagnosis present

## 2017-08-26 DIAGNOSIS — E872 Acidosis: Secondary | ICD-10-CM | POA: Diagnosis present

## 2017-08-26 DIAGNOSIS — M533 Sacrococcygeal disorders, not elsewhere classified: Secondary | ICD-10-CM | POA: Diagnosis present

## 2017-08-26 DIAGNOSIS — J440 Chronic obstructive pulmonary disease with acute lower respiratory infection: Secondary | ICD-10-CM | POA: Diagnosis present

## 2017-08-26 DIAGNOSIS — Z6829 Body mass index (BMI) 29.0-29.9, adult: Secondary | ICD-10-CM | POA: Diagnosis not present

## 2017-08-26 DIAGNOSIS — W19XXXA Unspecified fall, initial encounter: Secondary | ICD-10-CM | POA: Diagnosis present

## 2017-08-26 LAB — MRSA PCR SCREENING: MRSA BY PCR: POSITIVE — AB

## 2017-08-26 LAB — BASIC METABOLIC PANEL
ANION GAP: 7 (ref 5–15)
BUN: 24 mg/dL — ABNORMAL HIGH (ref 6–20)
CALCIUM: 7.9 mg/dL — AB (ref 8.9–10.3)
CO2: 27 mmol/L (ref 22–32)
Chloride: 104 mmol/L (ref 101–111)
Creatinine, Ser: 0.78 mg/dL (ref 0.44–1.00)
GFR calc Af Amer: >60 mL/min (ref >60)
GLUCOSE: 132 mg/dL — AB (ref 65–99)
Potassium: 3.6 mmol/L (ref 3.5–5.1)
SODIUM: 138 mmol/L (ref 135–145)

## 2017-08-26 LAB — CBC
HCT: 28.2 % — ABNORMAL LOW (ref 35.0–47.0)
Hemoglobin: 8.9 g/dL — ABNORMAL LOW (ref 12.0–16.0)
MCH: 27.1 pg (ref 26.0–34.0)
MCHC: 31.7 g/dL — AB (ref 32.0–36.0)
MCV: 85.6 fL (ref 80.0–100.0)
Platelets: 187 10*3/uL (ref 150–440)
RBC: 3.29 MIL/uL — ABNORMAL LOW (ref 3.80–5.20)
RDW: 15.1 % — AB (ref 11.5–14.5)
WBC: 5.7 10*3/uL (ref 3.6–11.0)

## 2017-08-26 LAB — LACTIC ACID, PLASMA
Lactic Acid, Venous: 1.4 mmol/L (ref 0.5–1.9)
Lactic Acid, Venous: 2.3 mmol/L (ref 0.5–1.9)

## 2017-08-26 LAB — MAGNESIUM: Magnesium: 1.6 mg/dL — ABNORMAL LOW (ref 1.7–2.4)

## 2017-08-26 MED ORDER — DEXTROSE 5 % IV SOLN: 500.0000 mg | Freq: Once | INTRAVENOUS | Status: DC

## 2017-08-26 MED ORDER — FOLIC ACID 1 MG PO TABS
1.0000 mg | ORAL_TABLET | Freq: Every day | ORAL | Status: DC
  Administered 2017-08-26 – 2017-08-28 (×3): 1 mg via ORAL
  Filled 2017-08-26 (×3): qty 1

## 2017-08-26 MED ORDER — SODIUM CHLORIDE 0.9% FLUSH: 3.0000 mL | INTRAVENOUS | Status: DC | PRN

## 2017-08-26 MED ORDER — ENOXAPARIN SODIUM 40 MG/0.4ML ~~LOC~~ SOLN
40.0000 mg | SUBCUTANEOUS | Status: DC
  Administered 2017-08-26 – 2017-08-28 (×3): 40 mg via SUBCUTANEOUS
  Filled 2017-08-26 (×3): qty 0.4

## 2017-08-26 MED ORDER — TOPIRAMATE 25 MG PO TABS
25.0000 mg | ORAL_TABLET | Freq: Every day | ORAL | Status: DC
  Administered 2017-08-26 – 2017-08-27 (×2): 25 mg via ORAL
  Filled 2017-08-26 (×2): qty 1

## 2017-08-26 MED ORDER — SODIUM CHLORIDE 0.9 % IV SOLN: 250.0000 mL | INTRAVENOUS | Status: DC | PRN

## 2017-08-26 MED ORDER — DEXTROSE 5 % IV SOLN
2.0000 g | Freq: Once | INTRAVENOUS | Status: AC
  Administered 2017-08-26: 2 g via INTRAVENOUS
  Filled 2017-08-26: qty 2

## 2017-08-26 MED ORDER — SENNOSIDES-DOCUSATE SODIUM 8.6-50 MG PO TABS: 1.0000 | ORAL_TABLET | Freq: Every evening | ORAL | Status: DC | PRN

## 2017-08-26 MED ORDER — METOPROLOL TARTRATE 25 MG PO TABS
25.0000 mg | ORAL_TABLET | Freq: Two times a day (BID) | ORAL | Status: DC
  Filled 2017-08-26: qty 1

## 2017-08-26 MED ORDER — VANCOMYCIN HCL IN DEXTROSE 1-5 GM/200ML-% IV SOLN
1000.0000 mg | Freq: Once | INTRAVENOUS | Status: AC
  Administered 2017-08-26: 1000 mg via INTRAVENOUS
  Filled 2017-08-26: qty 200

## 2017-08-26 MED ORDER — CLONAZEPAM 0.125 MG PO TBDP
0.1250 mg | ORAL_TABLET | Freq: Two times a day (BID) | ORAL | Status: DC
  Administered 2017-08-26 – 2017-08-28 (×5): 0.125 mg via ORAL
  Filled 2017-08-26 (×5): qty 1

## 2017-08-26 MED ORDER — IPRATROPIUM-ALBUTEROL 20-100 MCG/ACT IN AERS: 2.0000 | INHALATION_SPRAY | Freq: Four times a day (QID) | RESPIRATORY_TRACT | Status: DC | PRN

## 2017-08-26 MED ORDER — PREGABALIN 50 MG PO CAPS
50.0000 mg | ORAL_CAPSULE | Freq: Two times a day (BID) | ORAL | Status: DC
  Administered 2017-08-26 – 2017-08-28 (×5): 50 mg via ORAL
  Filled 2017-08-26 (×5): qty 1

## 2017-08-26 MED ORDER — DEXTROSE 5 % IV SOLN
1.0000 g | INTRAVENOUS | Status: DC
  Administered 2017-08-26 – 2017-08-27 (×2): 1 g via INTRAVENOUS
  Filled 2017-08-26 (×3): qty 1

## 2017-08-26 MED ORDER — SODIUM CHLORIDE 0.9 % IV SOLN
INTRAVENOUS | Status: AC
  Administered 2017-08-26 – 2017-08-27 (×2): via INTRAVENOUS

## 2017-08-26 MED ORDER — MIRTAZAPINE 15 MG PO TABS
7.5000 mg | ORAL_TABLET | Freq: Every day | ORAL | Status: DC
  Administered 2017-08-26 – 2017-08-27 (×2): 7.5 mg via ORAL
  Filled 2017-08-26 (×2): qty 1

## 2017-08-26 MED ORDER — LISINOPRIL 5 MG PO TABS: 2.5000 mg | ORAL_TABLET | Freq: Two times a day (BID) | ORAL | Status: DC

## 2017-08-26 MED ORDER — CYANOCOBALAMIN 500 MCG PO TABS
500.0000 ug | ORAL_TABLET | Freq: Every day | ORAL | Status: DC
  Administered 2017-08-26 – 2017-08-28 (×3): 500 ug via ORAL
  Filled 2017-08-26 (×3): qty 1

## 2017-08-26 MED ORDER — ONDANSETRON HCL 4 MG/2ML IJ SOLN
4.0000 mg | Freq: Four times a day (QID) | INTRAMUSCULAR | Status: DC | PRN
  Administered 2017-08-26: 4 mg via INTRAVENOUS
  Filled 2017-08-26: qty 2

## 2017-08-26 MED ORDER — FLUTICASONE PROPIONATE 50 MCG/ACT NA SUSP
2.0000 | Freq: Two times a day (BID) | NASAL | Status: DC
  Administered 2017-08-26 – 2017-08-28 (×5): 2 via NASAL
  Filled 2017-08-26: qty 16

## 2017-08-26 MED ORDER — SODIUM CHLORIDE 0.9% FLUSH
3.0000 mL | Freq: Two times a day (BID) | INTRAVENOUS | Status: DC
  Administered 2017-08-26 – 2017-08-27 (×2): 3 mL via INTRAVENOUS

## 2017-08-26 MED ORDER — PREDNISONE 10 MG PO TABS
5.0000 mg | ORAL_TABLET | Freq: Every day | ORAL | Status: DC
  Administered 2017-08-26 – 2017-08-28 (×3): 5 mg via ORAL
  Filled 2017-08-26 (×3): qty 1

## 2017-08-26 MED ORDER — FLUTICASONE FUROATE 27.5 MCG/SPRAY NA SUSP: 2.0000 | Freq: Two times a day (BID) | NASAL | Status: DC

## 2017-08-26 MED ORDER — IPRATROPIUM-ALBUTEROL 0.5-2.5 (3) MG/3ML IN SOLN
3.0000 mL | Freq: Four times a day (QID) | RESPIRATORY_TRACT | Status: DC | PRN
  Administered 2017-08-28: 3 mL via RESPIRATORY_TRACT
  Filled 2017-08-26: qty 3

## 2017-08-26 MED ORDER — SODIUM CHLORIDE 0.9 % IV BOLUS (SEPSIS)
1000.0000 mL | Freq: Once | INTRAVENOUS | Status: AC
  Administered 2017-08-26: 1000 mL via INTRAVENOUS

## 2017-08-26 MED ORDER — MONTELUKAST SODIUM 10 MG PO TABS
10.0000 mg | ORAL_TABLET | Freq: Every day | ORAL | Status: DC
  Administered 2017-08-26 – 2017-08-28 (×3): 10 mg via ORAL
  Filled 2017-08-26 (×3): qty 1

## 2017-08-26 MED ORDER — ONDANSETRON HCL 4 MG PO TABS: 4.0000 mg | ORAL_TABLET | Freq: Four times a day (QID) | ORAL | Status: DC | PRN

## 2017-08-26 MED ORDER — ESCITALOPRAM OXALATE 10 MG PO TABS
10.0000 mg | ORAL_TABLET | Freq: Every morning | ORAL | Status: DC
  Administered 2017-08-26 – 2017-08-28 (×3): 10 mg via ORAL
  Filled 2017-08-26 (×3): qty 1

## 2017-08-26 MED ORDER — BUSPIRONE HCL 15 MG PO TABS
7.5000 mg | ORAL_TABLET | Freq: Two times a day (BID) | ORAL | Status: DC
  Administered 2017-08-26 – 2017-08-28 (×5): 7.5 mg via ORAL
  Filled 2017-08-26 (×5): qty 1

## 2017-08-26 MED ORDER — CEFTRIAXONE SODIUM IN DEXTROSE 20 MG/ML IV SOLN: 1.0000 g | Freq: Once | INTRAVENOUS | Status: DC

## 2017-08-26 MED ORDER — PRAMIPEXOLE DIHYDROCHLORIDE 0.25 MG PO TABS
0.2500 mg | ORAL_TABLET | Freq: Every day | ORAL | Status: DC
  Administered 2017-08-26 – 2017-08-28 (×3): 0.25 mg via ORAL
  Filled 2017-08-26 (×3): qty 1

## 2017-08-26 MED ORDER — OXYCODONE-ACETAMINOPHEN 5-325 MG PO TABS
1.0000 | ORAL_TABLET | ORAL | Status: DC | PRN
  Administered 2017-08-26 – 2017-08-27 (×3): 1 via ORAL
  Filled 2017-08-26 (×3): qty 1

## 2017-08-26 MED ORDER — ACETAMINOPHEN 325 MG PO TABS
650.0000 mg | ORAL_TABLET | Freq: Four times a day (QID) | ORAL | Status: DC | PRN
  Administered 2017-08-28: 650 mg via ORAL
  Filled 2017-08-26: qty 2

## 2017-08-26 MED ORDER — GUAIFENESIN 100 MG/5ML PO SOLN
20.0000 mL | Freq: Four times a day (QID) | ORAL | Status: DC
  Administered 2017-08-26 – 2017-08-28 (×9): 400 mg via ORAL
  Filled 2017-08-26 (×10): qty 20

## 2017-08-26 MED ORDER — LAMOTRIGINE 25 MG PO TABS
100.0000 mg | ORAL_TABLET | Freq: Every day | ORAL | Status: DC
  Administered 2017-08-26 – 2017-08-28 (×3): 100 mg via ORAL
  Filled 2017-08-26 (×3): qty 4

## 2017-08-26 MED ORDER — PANTOPRAZOLE SODIUM 40 MG PO TBEC
40.0000 mg | DELAYED_RELEASE_TABLET | Freq: Every day | ORAL | Status: DC
  Administered 2017-08-26 – 2017-08-28 (×3): 40 mg via ORAL
  Filled 2017-08-26 (×3): qty 1

## 2017-08-26 MED ORDER — ACETAMINOPHEN 650 MG RE SUPP: 650.0000 mg | Freq: Four times a day (QID) | RECTAL | Status: DC | PRN

## 2017-08-26 MED ORDER — VANCOMYCIN HCL 500 MG IV SOLR
500.0000 mg | Freq: Two times a day (BID) | INTRAVENOUS | Status: DC
  Filled 2017-08-26: qty 500

## 2017-08-26 MED ORDER — VANCOMYCIN HCL 500 MG IV SOLR
500.0000 mg | Freq: Two times a day (BID) | INTRAVENOUS | Status: DC
  Administered 2017-08-26 – 2017-08-27 (×3): 500 mg via INTRAVENOUS
  Filled 2017-08-26 (×5): qty 500

## 2017-08-26 MED ORDER — LORATADINE 10 MG PO TABS
10.0000 mg | ORAL_TABLET | Freq: Every day | ORAL | Status: DC
  Administered 2017-08-26 – 2017-08-28 (×3): 10 mg via ORAL
  Filled 2017-08-26 (×3): qty 1

## 2017-08-26 NOTE — Progress Notes (Signed)
Patient admitted w/ pneumonia; is a bit hypotensive and has leukocytosis of 13. Patient's BP meds were ordered.  Spoke to MD to hold bp meds while bp low. MD notified and agrees with plan  Tobie Lords, PharmD, BCPS Clinical Pharmacist 08/26/2017

## 2017-08-26 NOTE — Progress Notes (Addendum)
Pharmacy Antibiotic Note  Meghan [name] is a 72 y.o. female admitted on 08/25/2017 with pneumonia.  Pharmacy has been consulted for vanc/cefepime dosing.  Plan: Patient received vanc 1g and cefepime 2g IV x 1 in ED  Will start vanc 500 mg IV q12h  Will draw vanc trough 0119 @ 1000 prior to 4th dose Will start cefepime 1g IV q24h  Ke 0.0418 T1/2 16 ~ reduced dose to 500 mg and giving q12h for ease of admin Goal trough 15 - 20 mcg/mL  Height: 4\' 8"  (142.2 cm) Weight: 124 lb (56.2 kg) IBW/kg (Calculated) : 36.3  Temp (24hrs), Avg:98.7 F (37.1 C), Min:98.4 F (36.9 C), Max:98.9 F (37.2 C)  Recent Labs  Lab 08/25/17 2241 08/26/17 0402  WBC 13.8*  --   CREATININE 0.80  --   LATICACIDVEN  --  1.4    Estimated Creatinine Clearance: 45.1 mL/min (by C-G formula based on SCr of 0.8 mg/dL).    Allergies  Allergen Reactions  . Amoxicillin Itching and Other (See Comments)    Has patient had a PCN reaction causing immediate rash, facial/tongue/throat swelling, SOB or lightheadedness with hypotension: Unknown Has patient had a PCN reaction causing severe rash involving mucus membranes or skin necrosis: Unknown Has patient had a PCN reaction that required hospitalization: Unknown Has patient had a PCN reaction occurring within the last 10 years: Unknown If all of the above answers are "NO", then may proceed with Cephalosporin use.   . Gabapentin Other (See Comments)    Pt states that it causes her BP to drop.   . Naproxen Hives  . Nsaids Other (See Comments)    Reaction:  Unknown    Thank you for allowing pharmacy to be a part of this patient's care.  Tobie Lords, PharmD, BCPS Clinical Pharmacist 08/26/2017

## 2017-08-26 NOTE — NC FL2 (Signed)
Meghan [name] LEVEL OF CARE SCREENING TOOL     IDENTIFICATION  [name] Birthdate: August 22, 1945 Sex: female Admission Date (Current Location): 08/25/2017  [facility] and [place] Number:  Meghan [name] ([id number]) Facility and Address:  [facility], [address], [place], [place] [address]      Provider Number: [id number]  Attending Physician Name and Address:  Demetrios Loll, MD  Relative Name and Phone Number:       Current Level of Care: Hospital Recommended Level of Care: [place] Prior Approval Number:    Date Approved/Denied:   PASRR Number:    Discharge Plan: SNF    Current Diagnoses: Patient Active Problem List   Diagnosis Date Noted  . Syncope 05/05/2017  . Acute on chronic systolic CHF (congestive heart failure) ([place]) 04/16/2017  . Acute lower UTI 04/16/2017  . Acute respiratory failure ([place]) 04/16/2017  . Pressure injury of skin 03/22/2017  . HCAP (healthcare-associated pneumonia) 03/20/2017  . Acute on chronic respiratory failure with hypoxia ([place]) 03/20/2017  . Acute respiratory failure with hypoxia ([place]) 03/20/2017  . Near syncope 02/27/2017  . Dehydration 02/27/2017  . Colitis 02/22/2017  . Seizure ([place]) 01/03/2016  . Chronic tension-type headache, intractable 12/04/2015  . 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 Degeneration of intervertebral disc of lumbar region 07/15/2015  . Seropositive rheumatoid arthritis ([place]) 07/15/2015  . Asthma with acute exacerbation 03/10/2015  . Hypokalemia 03/01/2015  . Hyponatremia 03/01/2015  . DDD (degenerative disc disease), lumbar 01/31/2015  . Arthritis, degenerative 01/31/2015  . Rheumatoid arthritis with rheumatoid factor ([place]) 01/31/2015  . HTN (hypertension) 12/24/2014  . Sepsis ([place]) 12/24/2014  . Left knee pain 12/24/2014  . GERD (gastroesophageal reflux disease) 12/24/2014  . COPD (chronic obstructive pulmonary disease) ([place]) 12/24/2014  .  Depression 12/24/2014  . Anxiety 12/24/2014  . Severe bipolar disorder with psychotic features, mood-congruent ([place]) 11/16/2014  . Neurosis, posttraumatic 11/16/2014  . H/O gastric ulcer 11/16/2014  . Barton's fracture of distal radius, closed 09/19/2014  . Neuritis or radiculitis due to rupture of lumbar intervertebral disc 05/11/2014  . Cervico-occipital neuralgia 03/26/2014  . Difficulty in walking 03/26/2014  . Difficulty in walking, not elsewhere classified 03/26/2014  . Cervical spine syndrome 01/26/2014  . Cephalalgia 01/09/2014  . Disordered sleep 01/09/2014  . BP (high blood pressure) 10/19/2013  . Adiposity 10/19/2013  . Cardiac murmur 10/19/2013  . PNA (pneumonia) 10/19/2013  . Breath shortness 10/19/2013  . Chronic obstructive pulmonary disease ([place]) 10/19/2013  . Diabetes mellitus ([place]) 10/19/2013    Orientation RESPIRATION BLADDER Height & Weight     Self, Place  O2(2L) Continent Weight: 133 lb 3.2 oz (60.4 kg) Height:  4\' 8"  (142.2 cm)  BEHAVIORAL SYMPTOMS/MOOD NEUROLOGICAL BOWEL NUTRITION STATUS      Continent Diet(Heart Healthy)  AMBULATORY STATUS COMMUNICATION OF NEEDS Skin   Extensive Assist Verbally Bruising([place]ail[place])                       Personal Care Assistance Level of Assistance  Bathing, Feeding, Dressing Bathing Assistance: Limited assistance Feeding assistance: Independent Dressing Assistance: Limited assistance     Functional Limitations Info  Sight, Hearing, Speech Sight Info: Adequate Hearing Info: Adequate Speech Info: Adequate    SPECIAL CARE FACTORS FREQUENCY  PT (By licensed PT), OT (By licensed OT)     PT Frequency: (5) OT Frequency: (5)            Contractures      Additional Factors Info  Code Status, Allergies, Isolation Precautions Code Status Info: (DNR) Allergies Info: (AMOXICILLIN, GABAPENTIN, NAPROXEN, NSAIDS )     Isolation Precautions Info: (Nasal Swab)     Current Medications (08/26/2017):  This is  the current hospital active medication list Current Facility-Administered Medications  Medication Dose Route Frequency Provider Last Rate Last Dose  . 0.9 %  sodium chloride infusion  250 mL Intravenous PRN Pyreddy, Reatha Harps, MD      . acetaminophen (TYLENOL) tablet 650 mg  650 mg Oral Q6H PRN Pyreddy, Reatha Harps, MD       Or  . acetaminophen (TYLENOL) suppository 650 mg  650 mg Rectal Q6H PRN Pyreddy, Reatha Harps, MD      . busPIRone (BUSPAR) tablet 7.5 mg  7.5 mg Oral BID Pyreddy, Pavan, MD      . ceFEPIme (MAXIPIME) 1 g in dextrose 5 % 50 mL IVPB  1 g Intravenous Q24H Pyreddy, Pavan, MD      . clonazepam (KLONOPIN) disintegrating tablet 0.125 mg  0.125 mg Oral BID Pyreddy, Reatha Harps, MD      . cyanocobalamin tablet 500 mcg  500 mcg Oral Daily Pyreddy, Pavan, MD      . enoxaparin (LOVENOX) injection 40 mg  40 mg Subcutaneous Q24H Saundra Shelling, MD   40 mg at 08/26/17 3086  . escitalopram (LEXAPRO) tablet 10 mg  10 mg Oral q morning - 10a Pyreddy, Pavan, MD      . fluticasone (FLONASE) 50 MCG/ACT nasal spray 2 spray  2 spray Each Nare BID Pyreddy, Pavan, MD      . folic acid (FOLVITE) tablet 1 mg  1 mg Oral Daily Pyreddy, Pavan, MD      . guaiFENesin (ROBITUSSIN) 100 MG/5ML solution 400 mg  20 mL Oral Q6H Pyreddy, Pavan, MD      . ipratropium-albuterol (DUONEB) 0.5-2.5 (3) MG/3ML nebulizer solution 3 mL  3 mL Nebulization Q6H PRN Pyreddy, Reatha Harps, MD      . lamoTRIgine (LAMICTAL) tablet 100 mg  100 mg Oral Daily Pyreddy, Pavan, MD      . loratadine (CLARITIN) tablet 10 mg  10 mg Oral Daily Pyreddy, Pavan, MD      . metoprolol tartrate (LOPRESSOR) tablet 25 mg  25 mg Oral BID Pyreddy, Reatha Harps, MD      . mirtazapine (REMERON) tablet 7.5 mg  7.5 mg Oral QHS Pyreddy, Pavan, MD      . montelukast (SINGULAIR) tablet 10 mg  10 mg Oral Daily Pyreddy, Pavan, MD      . ondansetron (ZOFRAN) tablet 4 mg  4 mg Oral Q6H PRN Pyreddy, Reatha Harps, MD       Or  . ondansetron (ZOFRAN) injection 4 mg  4 mg Intravenous Q6H PRN Saundra Shelling, MD   4 mg at 08/26/17 0538  . oxyCODONE-acetaminophen (PERCOCET/ROXICET) 5-325 MG per tablet 1 tablet  1 tablet Oral Q4H PRN Saundra Shelling, MD   1 tablet at 08/26/17 0539  . pantoprazole (PROTONIX) EC tablet 40 mg  40 mg Oral Daily Pyreddy, Pavan, MD      . pramipexole (MIRAPEX) tablet 0.25 mg  0.25 mg Oral Daily Pyreddy, Pavan, MD      . predniSONE (DELTASONE) tablet 5 mg  5 mg Oral Q breakfast Pyreddy, Pavan, MD      . pregabalin (LYRICA) capsule 50 mg  50 mg Oral BID Pyreddy, Pavan, MD      . senna-docusate (Senokot-S) tablet 1 tablet  1 tablet Oral QHS PRN Saundra Shelling, MD      .  sodium chloride flush (NS) 0.9 % injection 3 mL  3 mL Intravenous Q12H Pyreddy, Pavan, MD      . sodium chloride flush (NS) 0.9 % injection 3 mL  3 mL Intravenous PRN Pyreddy, Pavan, MD      . topiramate (TOPAMAX) tablet 25 mg  25 mg Oral QHS Pyreddy, Pavan, MD      . vancomycin (VANCOCIN) 500 mg in sodium chloride 0.9 % 100 mL IVPB  500 mg Intravenous Q12H Saundra Shelling, MD         Discharge Medications: Please see discharge summary for a list of discharge medications.  Relevant Imaging Results:  Relevant Lab Results:   Additional Information (SSN: [id number])  Smith Mince, Student-Social Work

## 2017-08-26 NOTE — Progress Notes (Signed)
The patient has no complaints.  On oxygen 2 L by nasal cannula. HAP and dehydration Continue cefepime and vancomycin IV, IV fluids support, follow-up cultures and PT evaluation.  Possible discharge to SNF in 2 days. Discussed with RN and Education officer, museum.

## 2017-08-26 NOTE — H&P (Signed)
Meghan [name] NAME: Meghan [name]    MR#:  [id number]  DATE OF BIRTH:  1946/01/03  DATE OF ADMISSION:  08/25/2017  PRIMARY CARE PHYSICIAN: Lavera Guise, MD   REQUESTING/REFERRING PHYSICIAN:   CHIEF COMPLAINT:   Chief Complaint  Patient presents with  . Altered Mental Status    HISTORY OF PRESENT ILLNESS: Meghan [name]  is a 72 y.o. female with a known history of congestive heart failure, COPD, coronary artery disease, diabetes mellitus, GERD, arthritis, bronchial asthma was referred from [facility] facility for confusion.  Patient was nonverbal and had decreased responsiveness today at the facility.  She was sent to the emergency room she was worked up with CT head which showed no acute abnormality.  She also had a fall at the facility due to loss of balance.  Patient complains of pain in the tailbone area secondary to fall.  Pelvic x-ray and lower extremity x-rays showed no fracture.  Patient is more awake and verbally responsive in the emergency room.  She is alert, awake and oriented to time place and person response to all verbal commands.  No complaints of any chest pain, shortness of breath.  No fever or chills.  No complaints of any dysuria.  Has cough.  Chest x-ray showed bibasilar densities.  PAST MEDICAL HISTORY:   Past Medical History:  Diagnosis Date  . Anginal pain ([place])   . Anxiety   . Arthritis    RA  . Asthma   . Brain tumor (benign) ([place])   . CHF (congestive heart failure) ([place])   . Collagen vascular disease ([place])   . COPD (chronic obstructive pulmonary disease) ([place])   . Coronary artery disease   . DDD (degenerative disc disease)   . Depression   . Diabetes mellitus   . GERD (gastroesophageal reflux disease)   . Headache   . Heart murmur   . Heart murmur   . Hypercholesteremia   . Hypertension   . Lumbar degenerative disc disease   . Migraines   . Obesity   . Osteopenia   . Osteoporosis   .  Pneumonia   . PTSD (post-traumatic stress disorder)     PAST SURGICAL HISTORY:  Past Surgical History:  Procedure Laterality Date  . ABDOMINAL HYSTERECTOMY    . CERVICAL FUSION    . CHOLECYSTECTOMY    . COLONOSCOPY WITH PROPOFOL N/A 09/20/2015   Procedure: COLONOSCOPY WITH PROPOFOL;  Surgeon: Josefine Class, MD;  Location: [facility] ENDOSCOPY;  Service: Endoscopy;  Laterality: N/A;  . COLONOSCOPY WITH PROPOFOL N/A 01/27/2016   Procedure: COLONOSCOPY WITH PROPOFOL;  Surgeon: Manya Silvas, MD;  Location: [facility] ENDOSCOPY;  Service: Endoscopy;  Laterality: N/A;  . FINGER ARTHROPLASTY  02/23/2012   Procedure: FINGER ARTHROPLASTY;  Surgeon: Cammie Sickle., MD;  Location: [facility];  Service: Orthopedics;  Laterality: Left;  Extensor carpi radialis longus to Extensor carpi ulnaris transfer, left Metaphalangeal reconstructions of index and long fingers,  . FOOT ARTHROPLASTY     toes x2 rt foot  . HAND RECONSTRUCTION  2011   right-multiple finger joint reconst  . JOINT REPLACEMENT     bilat knee replacements  . KYPHOPLASTY N/A 06/14/2017   Procedure: KYPHOPLASTY L1;  Surgeon: Hessie Knows, MD;  Location: [facility] ORS;  Service: Orthopedics;  Laterality: N/A;  . LOOP RECORDER INSERTION N/A 06/23/2017   Procedure: LOOP RECORDER INSERTION;  Surgeon: Isaias Cowman, MD;  Location: [facility];  Service: Cardiovascular;  Laterality: N/A;    SOCIAL HISTORY:  Social History   Tobacco Use  . Smoking status: Never Smoker  . Smokeless tobacco: Never Used  Substance Use Topics  . Alcohol use: No    Alcohol/week: 0.0 oz    FAMILY HISTORY:  Family History  Problem Relation Age of Onset  . Depression Sister   . Migraines Sister   . Hypertension Mother   . Arthritis/Rheumatoid Mother   . Heart attack Father   . Hypertension Father   . CAD Unknown   . Hypertension Unknown   . Diabetes Mellitus II Unknown   . Arthritis Unknown     DRUG ALLERGIES:   Allergies  Allergen Reactions  . Amoxicillin Itching and Other (See Comments)    Has patient had a PCN reaction causing immediate rash, facial/tongue/throat swelling, SOB or lightheadedness with hypotension: Unknown Has patient had a PCN reaction causing severe rash involving mucus membranes or skin necrosis: Unknown Has patient had a PCN reaction that required hospitalization: Unknown Has patient had a PCN reaction occurring within the last 10 years: Unknown If all of the above answers are "NO", then may proceed with Cephalosporin use.   . Gabapentin Other (See Comments)    Pt states that it causes her BP to drop.   . Naproxen Hives  . Nsaids Other (See Comments)    Reaction:  Unknown     REVIEW OF SYSTEMS:   CONSTITUTIONAL: No fever, has weakness.  EYES: No blurred or double vision.  EARS, NOSE, AND THROAT: No tinnitus or ear pain.  RESPIRATORY: Has cough,  No shortness of breath, wheezing or hemoptysis.  CARDIOVASCULAR: No chest pain, orthopnea, edema.  GASTROINTESTINAL: No nausea, vomiting, diarrhea or abdominal pain.  GENITOURINARY: No dysuria, hematuria.  ENDOCRINE: No polyuria, nocturia,  HEMATOLOGY: No anemia, easy bruising or bleeding SKIN: No rash or lesion. MUSCULOSKELETAL:  Lower extremity pain Has sacral pain NEUROLOGIC: No tingling, numbness, weakness.  PSYCHIATRY: No anxiety or depression.   MEDICATIONS AT HOME:  Prior to Admission medications   Medication Sig Start Date End Date Taking? Authorizing Provider  busPIRone (BUSPAR) 7.5 MG tablet Take 1 tablet by mouth 2 (two) times daily.   Yes [provider]  Cholecalciferol 5000 units TABS Take 5,000 Units by mouth every 30 (thirty) days.   Yes [provider]  clonazePAM (KLONOPIN) 0.5 MG tablet Take 0.125 mg by mouth 2 (two) times daily.   Yes [provider]  Cyanocobalamin (VITAMIN B 12 PO) Take 500 mg by mouth daily.   Yes [provider]  diclofenac sodium (VOLTAREN)  1 % GEL Apply 2 g topically 3 (three) times daily. Apply to the fingers   Yes [provider]  diclofenac sodium (VOLTAREN) 1 % GEL Apply 4 g topically 4 (four) times daily. Apply to the left knee   Yes [provider]  diphenoxylate-atropine (LOMOTIL) 2.5-0.025 MG tablet Take 1 tablet by mouth every 6 (six) hours as needed for diarrhea or loose stools.    Yes [provider]  escitalopram (LEXAPRO) 10 MG tablet Take 1 tablet (10 mg total) by mouth every morning. 09/11/16  Yes Rainey Pines, MD  fluticasone (VERAMYST) 27.5 MCG/SPRAY nasal spray Place 2 sprays into the nose 2 (two) times daily.   Yes [provider]  folic acid (FOLVITE) 1 MG tablet Take 1 mg by mouth daily.   Yes [provider]  furosemide (LASIX) 40 MG tablet Take 40 mg by mouth 2 (  two) times daily.   Yes [provider]  guaiFENesin (ROBITUSSIN) 100 MG/5ML SOLN Take 20 mLs by mouth every 6 (six) hours. 08/20/17 09/03/17 Yes [provider]  Ipratropium-Albuterol (COMBIVENT RESPIMAT) 20-100 MCG/ACT AERS respimat Inhale 2 puffs into the lungs every 6 (six) hours as needed for wheezing.   Yes [provider]  Ipratropium-Albuterol (COMBIVENT RESPIMAT) 20-100 MCG/ACT AERS respimat Inhale 2 puffs into the lungs every 6 (six) hours.   Yes [provider]  lamoTRIgine (LAMICTAL) 100 MG tablet Take 1 tablet (100 mg total) by mouth daily. 06/02/16  Yes Rainey Pines, MD  lisinopril (PRINIVIL,ZESTRIL) 2.5 MG tablet Take 1 tablet by mouth 2 (two) times daily.   Yes [provider]  loperamide (IMODIUM A-D) 2 MG tablet Take 1 tablet (2 mg total) by mouth 4 (four) times daily as needed for diarrhea or loose stools. Patient taking differently: Take 2 mg by mouth as needed for diarrhea or loose stools.  05/10/17  Yes Eula Listen, MD  loratadine (CLARITIN) 10 MG tablet Take 10 mg by mouth daily.   Yes [provider]  methotrexate (RHEUMATREX) 7.5  MG tablet Take 7.5 mg by mouth once a week. Caution" Chemotherapy. Protect from light.   Yes [provider]  metoprolol tartrate (LOPRESSOR) 25 MG tablet Take 25 mg by mouth 2 (two) times daily.   Yes [provider]  mirtazapine (REMERON) 7.5 MG tablet Take 7.5 mg by mouth at bedtime.    Yes [provider]  montelukast (SINGULAIR) 10 MG tablet Take 10 mg by mouth daily.   Yes [provider]  omeprazole (PRILOSEC) 20 MG capsule Take 20 mg by mouth daily.    Yes [provider]  oxyCODONE-acetaminophen (PERCOCET/ROXICET) 5-325 MG tablet Take 1 tablet every 4 (four) hours as needed by mouth for moderate pain or severe pain. 06/15/17  Yes Henreitta Leber, MD  Potassium Chloride ER 20 MEQ TBCR Take 20 mEq by mouth 2 (two) times daily. 05/07/17  Yes Vaughan Basta, MD  pramipexole (MIRAPEX) 0.25 MG tablet Take 0.25 mg by mouth daily.    Yes [provider]  predniSONE (DELTASONE) 5 MG tablet Take 5 mg by mouth daily with breakfast.   Yes [provider]  pregabalin (LYRICA) 50 MG capsule TAKE ONE CAPSULE BY MOUTH TWICE A DAY 06/18/15  Yes [provider]  promethazine (PHENERGAN) 25 MG suppository Place 1 suppository (25 mg total) rectally every 6 (six) hours as needed for nausea. 05/10/17 05/10/18 Yes Eula Listen, MD  saxagliptin HCl (ONGLYZA) 5 MG TABS tablet Take 5 mg by mouth at bedtime.   Yes [provider]  Tofacitinib Citrate 5 MG TABS Take 5 mg by mouth 2 (two) times daily after a meal.    Yes [provider]  topiramate (TOPAMAX) 25 MG tablet Take 25 mg by mouth at bedtime.    Yes [provider]      PHYSICAL EXAMINATION:   VITAL SIGNS: Blood pressure (!) 87/53, pulse 66, temperature 98.9 F (37.2 C), temperature source Oral, resp. rate 17, height 4\' 8"  (1.422 m), weight 56.2 kg (124 lb), SpO2 100 %.  GENERAL:  72 y.o.-year-old patient lying in the bed with no acute  distress.  EYES: Pupils equal, round, reactive to light and accommodation. No scleral icterus. Extraocular muscles intact.  HEENT: Head atraumatic, normocephalic. Oropharynx and nasopharynx clear.  NECK:  Supple, no jugular venous distention. No thyroid enlargement, no tenderness.  LUNGS: Decreased breath sounds bilaterally,rales heard  in right lung. No use of accessory muscles of respiration.  CARDIOVASCULAR: S1, S2 normal. No murmurs, rubs, or gallops.  ABDOMEN: Soft, nontender, nondistended. Bowel sounds present. No organomegaly or mass.  EXTREMITIES: No pedal edema, cyanosis, or clubbing.  Sacral pain. NEUROLOGIC: Cranial nerves II through XII are intact. Muscle strength 5/5 in all extremities. Sensation intact. Gait not checked.  PSYCHIATRIC: The patient is alert and oriented x 3.  SKIN: No obvious rash, lesion, or ulcer.   LABORATORY PANEL:   CBC Recent Labs  Lab 08/25/17 2241  WBC 13.8*  HGB 10.7*  HCT 33.7*  PLT 268  MCV 85.1  MCH 27.1  MCHC 31.9*  RDW 15.0*   ------------------------------------------------------------------------------------------------------------------  Chemistries  Recent Labs  Lab 08/25/17 2241  NA 139  K 4.5  CL 103  CO2 26  GLUCOSE 123*  BUN 23*  CREATININE 0.80  CALCIUM 8.7*  AST 26  ALT 22  ALKPHOS 134*  BILITOT 1.2   ------------------------------------------------------------------------------------------------------------------ estimated creatinine clearance is 45.1 mL/min (by C-G formula based on SCr of 0.8 mg/dL). ------------------------------------------------------------------------------------------------------------------ No results for input(s): TSH, T4TOTAL, T3FREE, THYROIDAB in the last 72 hours.  Invalid input(s): FREET3   Coagulation profile No results for input(s): INR, PROTIME in the last 168  hours. ------------------------------------------------------------------------------------------------------------------- No results for input(s): DDIMER in the last 72 hours. -------------------------------------------------------------------------------------------------------------------  Cardiac Enzymes No results for input(s): CKMB, TROPONINI, MYOGLOBIN in the last 168 hours.  Invalid input(s): CK ------------------------------------------------------------------------------------------------------------------ Invalid input(s): POCBNP  ---------------------------------------------------------------------------------------------------------------  Urinalysis    Component Value Date/Time   COLORURINE YELLOW (A) 08/25/2017 2243   APPEARANCEUR CLEAR (A) 08/25/2017 2243   APPEARANCEUR Turbid (A) 05/05/2016 1010   LABSPEC 1.015 08/25/2017 2243   PHURINE 6.0 08/25/2017 2243   GLUCOSEU NEGATIVE 08/25/2017 2243   HGBUR NEGATIVE 08/25/2017 2243   BILIRUBINUR NEGATIVE 08/25/2017 2243   BILIRUBINUR Negative 05/05/2016 1010   KETONESUR NEGATIVE 08/25/2017 2243   PROTEINUR NEGATIVE 08/25/2017 2243   NITRITE NEGATIVE 08/25/2017 2243   LEUKOCYTESUR NEGATIVE 08/25/2017 2243   LEUKOCYTESUR 1+ (A) 05/05/2016 1010     RADIOLOGY: Dg Chest 2 View  Result Date: 08/26/2017 CLINICAL DATA:  72 year old female with cough and leukocytosis. Altered mental status EXAM: CHEST  2 VIEW COMPARISON:  Chest radiograph dated 06/09/2017 FINDINGS: There is eventration of the right hemidiaphragm. Bibasilar densities, increased compared to the prior radiograph, likely atelectatic changes. Infiltrate is not excluded. Clinical correlation is recommended. Trace pleural effusions may be present. There is no pneumothorax. Stable top-normal cardiac size. A loop recorder device is noted. Osteopenia with degenerative changes of the spine. Upper lumbar vertebroplasty changes. Right upper quadrant cholecystectomy clips.  IMPRESSION: Bibasilar densities increased from prior study, likely atelectatic changes. Pneumonia not excluded. Clinical correlation is recommended. Electronically Signed   By: Anner Crete M.D.   On: 08/26/2017 02:59   Ct Head Wo Contrast  Result Date: 08/25/2017 CLINICAL DATA:  72 y/o F; altered mental status and possible urinary tract infection. EXAM: CT HEAD WITHOUT CONTRAST TECHNIQUE: Contiguous axial images were obtained from the base of the skull through the vertex without intravenous contrast. COMPARISON:  06/09/2017 CT head FINDINGS: Brain: Stable encephalomalacia within the right cerebellar hemisphere subjacent to craniotomy. Stable mild chronic microvascular ischemic changes and parenchymal volume loss of the brain. No findings for large acute stroke, hemorrhage, or focal mass effect. No hydrocephalus, extra-axial collection, or effacement of basilar cisterns. Vascular: Calcific atherosclerosis of carotid siphons. Skull: Stable chronic postsurgical changes related to right suboccipital craniectomy. Sinuses/Orbits: Increased opacification of the left  mastoid air cells. Stable minimal right mastoid opacification. Normal aeration of visualized paranasal sinuses. Orbits are unremarkable. Other: None. IMPRESSION: 1. No acute intracranial abnormality identified. 2. Stable mild chronic microvascular ischemic changes and parenchymal volume loss of the brain. 3. Stable postsurgical changes related to right suboccipital craniotomy and right cerebellar hemisphere encephalomalacia. 4. Increased opacification of left mastoid air cells. Electronically Signed   By: Kristine Garbe M.D.   On: 08/25/2017 23:54   Dg Hip Unilat W Or Wo Pelvis 2-3 Views Left  Result Date: 08/25/2017 CLINICAL DATA:  72 year old female with left hip pain. EXAM: DG HIP (WITH OR WITHOUT PELVIS) 2-3V LEFT COMPARISON:  None. FINDINGS: There is no fracture or dislocation. The bones are osteopenic. Degenerative changes of the  lower lumbar spine. Vascular calcification and multiple surgical clips over the pelvis. The soft tissues are grossly unremarkable. IMPRESSION: No acute fracture or dislocation. Electronically Signed   By: Anner Crete M.D.   On: 08/25/2017 23:46    EKG: Orders placed or performed during the hospital encounter of 08/25/17  . EKG 12-Lead  . EKG 12-Lead  . EKG 12-Lead  . EKG 12-Lead    IMPRESSION AND PLAN: 72 year old female patient with history of congestive heart failure, diabetes mellitus, coronary artery disease, COPD, hyperlipidemia, hypertension, osteoporosis presented to the emergency room for confusion from [facility].  Admitting diagnosis 1.  Healthcare associated pneumonia 2.  Delirium secondary to pneumonia 3.  Dehydration 4.  Hyperlipidemia 5.  Hypertension Treatment plan Admit patient to medical floor Hold diuretics Start patient on IV vancomycin and IV cefepime antibiotic IV fluids cautiously Physical therapy evaluation for gait and balance training F/u wbc count  All the records are reviewed and case discussed with ED provider. Management plans discussed with the patient, family and they are in agreement.  CODE STATUS:DNR    Code Status Orders  (From admission, onward)        Start     Ordered   08/25/17 2304  Do not attempt resuscitation/DNR  Continuous    Question Answer Comment  In the event of cardiac or respiratory ARREST Do not call a "code blue"   In the event of cardiac or respiratory ARREST Do not perform Intubation, CPR, defibrillation or ACLS   In the event of cardiac or respiratory ARREST Use medication by any route, position, wound care, and other measures to relive pain and suffering. May use oxygen, suction and manual treatment of airway obstruction as needed for comfort.      08/25/17 2303    Code Status History    Date Active Date Inactive Code Status Order ID Comments User Context   06/09/2017 22:02 06/15/2017 20:46 DNR  [id number]  Demetrios Loll, MD Inpatient   05/05/2017 20:23 05/07/2017 20:16 Full Code [id number]  Baxter Hire, MD Inpatient   04/16/2017 10:15 04/19/2017 20:09 DNR [id number]  Vaughan Basta, MD Inpatient   03/20/2017 04:05 03/24/2017 20:08 Full Code [id number]  Lance Coon, MD Inpatient   02/27/2017 05:39 03/04/2017 18:14 Full Code [id number]  Saundra Shelling, MD Inpatient   02/22/2017 15:47 02/26/2017 23:05 Full Code [id number]  Vaughan Basta, MD Inpatient   03/01/2015 21:12 03/10/2015 20:17 Full Code [id number]  Demetrios Loll, MD Inpatient   12/24/2014 05:27 12/25/2014 18:32 DNR [id number]  Lance Coon, MD Inpatient    Advance Directive Documentation     Most Recent Value  Type of Advance Directive  Out of facility DNR (pink MOST or yellow form)  Pre-existing out of facility DNR order (  yellow form or pink MOST form)  Yellow form placed in chart (order not valid for inpatient use)  "MOST" Form in Place?  No data       TOTAL TIME TAKING CARE OF THIS PATIENT: 46minutes.    Saundra Shelling M.D on 08/26/2017 at 4:47 AM  Between 7am to 6pm - Pager - [phone]  After 6pm go to [email or web address] - password [email or web address] [facility]  [facility]  Office  [phone]  CC: Primary care physician; Lavera Guise, MD

## 2017-08-26 NOTE — Clinical Social Work Note (Addendum)
Clinical Social Work Assessment  Patient Details  Name: Meghan [name] MRN: [id number] Date of Birth: Nov 11, 1945  Date of referral:  08/26/17               Reason for consult:  Facility Placement, Discharge Planning                Permission sought to share information with:  Chartered certified accountant granted to share information::  Yes, Verbal Permission Granted  Name::      [place]::   [place]  Relationship::     Contact Information:     Housing/Transportation Living arrangements for the past 2 months:  [place] of Information:  Patient, Facility, Other (Comment Required)(Granddaughter) Patient Interpreter Needed:  None Criminal Activity/Legal Involvement Pertinent to Current Situation/Hospitalization:  No - Comment as needed Significant Relationships:  Other Family Members Lives with:    Do you feel safe going back to the place where you live?  Yes Need for family participation in patient care:  Yes (Comment)  Care giving concerns:  Patient is a long term care SNF resident at [facility] since November 2018.    Social Worker assessment / plan: Holiday representative ([place]) reviewed patient's chart and noted that she is from [facility]. Social work Theatre manager met with patient alone at bedside. Patient was sleeping but easy to wake and alert and oriented to self and place. Social work Theatre manager introduced self and explained the role of the [place]. Patient shared that she has been a resident at [facility] for 2 months and her granddaughters Danae Chen [phone]) and [name] [phone]) are her primary contact. Patient stated that she is comfortable returning to [facility]. Social work Theatre manager attempted to call patient's granddaughter Danae Chen and a voicemail was left. Social work Theatre manager was able to get in contact with patients other granddaughter [name] and made her aware of patients  visit to [facility]. Patient's granddaughter [name] was able to confirm above and shared that at baseline patient is wheelchair bound. Patient's granddaughter [name] is comfortable with her returning to [facility]. FL2 completed. Per [facility] admissions coordinator at [facility] patient is a long term care resident and that been with them since July 2018. Per [employer] is woring on a new PASARR for patient. Per Claiborne Billings patient can return to [facility] when stable.   Patient's other granddaughter Danae Chen called back and social work intern made her aware of above.CSW and social work Theatre manager will continue to follow up and assist.    Employment status:  Retired Forensic scientist:  Medicaid In [place], [employer] PT Recommendations:  [place] / Referral to community resources:  [place]  Patient/Family's Response to care: Patient and her granddaughter are agreeable for her to return to [facility].  Patient/Family's Understanding of and Emotional Response to Diagnosis, Current Treatment, and Prognosis: Patient and her granddaughter were both pleasant and thanked social work Theatre manager for her assistance.  Emotional Assessment Appearance:  Appears stated age Attitude/Demeanor/Rapport:    Affect (typically observed):  Accepting, Calm, Pleasant Orientation:  Oriented to Self, Oriented to Place Alcohol / Substance use:  Not Applicable Psych involvement (Current and /or in the community):  No (Comment)  Discharge Needs  Concerns to be addressed:  Care Coordination, Discharge Planning Concerns Readmission within the last 30 days:  No Current discharge risk:  Dependent with Mobility, Cognitively Impaired Barriers to Discharge:  Continued Medical  Work up   [employer], [place] Work 08/26/2017, 10:32 AM

## 2017-08-27 LAB — BASIC METABOLIC PANEL
ANION GAP: 8 (ref 5–15)
BUN: 23 mg/dL — ABNORMAL HIGH (ref 6–20)
CO2: 25 mmol/L (ref 22–32)
Calcium: 8.4 mg/dL — ABNORMAL LOW (ref 8.9–10.3)
Chloride: 108 mmol/L (ref 101–111)
Creatinine, Ser: 0.79 mg/dL (ref 0.44–1.00)
GFR calc Af Amer: >60 mL/min (ref >60)
GFR calc non Af Amer: >60 mL/min (ref >60)
GLUCOSE: 85 mg/dL (ref 65–99)
POTASSIUM: 3.9 mmol/L (ref 3.5–5.1)
Sodium: 141 mmol/L (ref 135–145)

## 2017-08-27 LAB — LACTIC ACID, PLASMA: Lactic Acid, Venous: 1 mmol/L (ref 0.5–1.9)

## 2017-08-27 MED ORDER — CHLORHEXIDINE GLUCONATE CLOTH 2 % EX PADS: 6.0000 | MEDICATED_PAD | Freq: Every day | CUTANEOUS | Status: DC

## 2017-08-27 MED ORDER — MUPIROCIN 2 % EX OINT
1.0000 "application " | TOPICAL_OINTMENT | Freq: Two times a day (BID) | CUTANEOUS | Status: DC
  Administered 2017-08-27 (×2): 1 via NASAL
  Filled 2017-08-27: qty 22

## 2017-08-27 NOTE — Progress Notes (Signed)
Per [facility] admissions coordinator at [facility] patient can return over the weekend if medically stable.   [employer], LCSW [phone]

## 2017-08-27 NOTE — Progress Notes (Signed)
[facility] NAME: Meghan [name]    MR#:  [id number]  DATE OF BIRTH:  [birth year]  SUBJECTIVE:  CHIEF COMPLAINT:   Chief Complaint  Patient presents with  . Altered Mental Status   Shortness of breath on exertion.  On oxygen 2 L nasal cannula. REVIEW OF SYSTEMS:  Review of Systems  Constitutional: Positive for malaise/fatigue. Negative for chills and fever.  HENT: Negative for sore throat.   Eyes: Negative for blurred vision and double vision.  Respiratory: Positive for cough and shortness of breath. Negative for hemoptysis, wheezing and stridor.   Cardiovascular: Negative for chest pain, palpitations, orthopnea and leg swelling.  Gastrointestinal: Negative for abdominal pain, blood in stool, diarrhea, melena, nausea and vomiting.  Genitourinary: Negative for dysuria, flank pain and hematuria.  Musculoskeletal: Negative for back pain and joint pain.  Skin: Negative for rash.  Neurological: Positive for weakness. Negative for dizziness, sensory change, focal weakness, seizures, loss of consciousness and headaches.  Endo/Heme/Allergies: Negative for polydipsia.  Psychiatric/Behavioral: Negative for depression. The patient is not nervous/anxious.     DRUG ALLERGIES:   Allergies  Allergen Reactions  . Amoxicillin Itching and Other (See Comments)    Has patient had a PCN reaction causing immediate rash, facial/tongue/throat swelling, SOB or lightheadedness with hypotension: Unknown Has patient had a PCN reaction causing severe rash involving mucus membranes or skin necrosis: Unknown Has patient had a PCN reaction that required hospitalization: Unknown Has patient had a PCN reaction occurring within the last 10 years: Unknown If all of the above answers are "NO", then may proceed with Cephalosporin use.   . Gabapentin Other (See Comments)    Pt states that it causes her BP to drop.   . Naproxen Hives  . Nsaids Other (See  Comments)    Reaction:  Unknown    VITALS:  Blood pressure 113/86, pulse (!) 120, temperature 97.8 F (36.6 C), temperature source Oral, resp. rate 18, height 4\' 8"  (1.422 m), weight 133 lb 3.2 oz (60.4 kg), SpO2 100 %. PHYSICAL EXAMINATION:  Physical Exam  Constitutional: She is oriented to person, place, and time and well-developed, well-nourished, and in no distress.  HENT:  Head: Normocephalic.  Mouth/Throat: Oropharynx is clear and moist.  Eyes: Conjunctivae and EOM are normal. Pupils are equal, round, and reactive to light. No scleral icterus.  Neck: Normal range of motion. Neck supple. No JVD present. No tracheal deviation present.  Cardiovascular: Normal rate, regular rhythm and normal heart sounds. Exam reveals no gallop.  No murmur heard. Pulmonary/Chest: Effort normal. No respiratory distress. She has no wheezes. She has no rales.  Left-sided crackles  Abdominal: Soft. Bowel sounds are normal. She exhibits no distension. There is no tenderness. There is no rebound.  Musculoskeletal: Normal range of motion. She exhibits no edema or tenderness.  Neurological: She is alert and oriented to person, place, and time. No cranial nerve deficit.  Skin: No rash noted. No erythema.  Psychiatric: Affect normal.   LABORATORY PANEL:  Female CBC Recent Labs  Lab 08/26/17 0646  WBC 5.7  HGB 8.9*  HCT 28.2*  PLT 187   ------------------------------------------------------------------------------------------------------------------ Chemistries  Recent Labs  Lab 08/25/17 2241 08/26/17 0646 08/27/17 0353  NA 139 138 141  K 4.5 3.6 3.9  CL 103 104 108  CO2 26 27 25   GLUCOSE 123* 132* 85  BUN 23* 24* 23*  CREATININE 0.80 0.78 0.79  CALCIUM 8.7* 7.9* 8.4*  MG  --  1.6*  --   AST 26  --   --   ALT 22  --   --   ALKPHOS 134*  --   --   BILITOT 1.2  --   --    RADIOLOGY:  No results found. ASSESSMENT AND PLAN:   1.    Acute respiratory failure with hypoxia due to healthcare  associated pneumonia Continue cefepime and vancomycin.  Follow-up cultures.  Leukocytosis improved. Try to wean off oxygen.  NEB PRN.  Lactic acidosis.  Improved.  2.    Acute metabolic encephalopathy secondary to above.  Improved.  3.  Dehydration, improved with IV fluid support. 4.  Hyperlipidemia 5.  Hypertension.  Controlled. Generalized weakness.  PT evaluation: SNF.  All the records are reviewed and case discussed with Care Management/Social Worker. Management plans discussed with the patient, family and they are in agreement.  CODE STATUS: DNR  TOTAL TIME TAKING CARE OF THIS PATIENT: 26 minutes.   More than 50% of the time was spent in counseling/coordination of care: YES  POSSIBLE D/C IN 1-2 DAYS, DEPENDING ON CLINICAL CONDITION.   Demetrios Loll M.D on 08/27/2017 at 4:48 PM  Between 7am to 6pm - Pager - [phone]  After 6pm go to [email or web address] - [facility]

## 2017-08-28 LAB — VANCOMYCIN, TROUGH: Vancomycin Tr: 10 ug/mL — ABNORMAL LOW (ref 15–20)

## 2017-08-28 MED ORDER — AZITHROMYCIN 500 MG PO TABS: 500.0000 mg | ORAL_TABLET | Freq: Every day | ORAL | 0 refills | Status: AC

## 2017-08-28 MED ORDER — CEFUROXIME AXETIL 500 MG PO TABS: 500.0000 mg | ORAL_TABLET | Freq: Two times a day (BID) | ORAL | 0 refills | Status: DC

## 2017-08-28 NOTE — Progress Notes (Signed)
Pt alert and oriented. No complaints of shortness of breath. Pt still remaining on 2L of oxygen. Up to bedside commode and had bowel movement. Medicated for pain x1 with good results. Pt able to sleep in between care.

## 2017-08-28 NOTE — Progress Notes (Signed)
Pt ready for discharge back to [facility]. EMS called for transport. Attempted to call REport to Facility 2x. Both times placed on hold. Awaiting return call from facitliy to give verbal report on pt.

## 2017-08-28 NOTE — Clinical Social Work Note (Signed)
The patient will discharge to [facility] today via non-emergent EMS. CSW has delivered the discharge packet including hard scripts and the DNR form. The facility and her family are aware and in agreement, and the facility has received all discharge documentation. CSW is signing off. Please consult should additional needs arise.  Santiago Bumpers, MSW, Latanya Presser [phone]

## 2017-08-28 NOTE — Plan of Care (Signed)
  Adequate for Discharge Education: Knowledge of General Education information will improve 08/28/2017 1104 - Adequate for Discharge by Milderd Meager, RN Health Behavior/Discharge Planning: Ability to manage health-related needs will improve 08/28/2017 1104 - Adequate for Discharge by Milderd Meager, RN Clinical Measurements: Ability to maintain clinical measurements within normal limits will improve 08/28/2017 1104 - Adequate for Discharge by Milderd Meager, RN Will remain free from infection 08/28/2017 1104 - Adequate for Discharge by Milderd Meager, RN Diagnostic test results will improve 08/28/2017 1104 - Adequate for Discharge by Milderd Meager, RN Respiratory complications will improve 08/28/2017 1104 - Adequate for Discharge by Milderd Meager, RN Cardiovascular complication will be avoided 08/28/2017 1104 - Adequate for Discharge by Milderd Meager, RN Activity: Risk for activity intolerance will decrease 08/28/2017 1104 - Adequate for Discharge by Milderd Meager, RN Nutrition: Adequate nutrition will be maintained 08/28/2017 1104 - Adequate for Discharge by Milderd Meager, RN Coping: Level of anxiety will decrease 08/28/2017 1104 - Adequate for Discharge by Milderd Meager, RN Elimination: Will not experience complications related to bowel motility 08/28/2017 1104 - Adequate for Discharge by Milderd Meager, RN Will not experience complications related to urinary retention 08/28/2017 1104 - Adequate for Discharge by Milderd Meager, RN Pain Managment: General experience of comfort will improve 08/28/2017 1104 - Adequate for Discharge by Milderd Meager, RN Safety: Ability to remain free from injury will improve 08/28/2017 1104 - Adequate for Discharge by Milderd Meager, RN Skin Integrity: Risk for impaired skin integrity will decrease 08/28/2017 1104 - Adequate for Discharge by Milderd Meager, RN Activity: Ability to tolerate  increased activity will improve 08/28/2017 1104 - Adequate for Discharge by Milderd Meager, RN Clinical Measurements: Ability to maintain a body temperature in the normal range will improve 08/28/2017 1104 - Adequate for Discharge by Milderd Meager, RN Respiratory: Ability to maintain adequate ventilation will improve 08/28/2017 1104 - Adequate for Discharge by Milderd Meager, RN Ability to maintain a clear airway will improve 08/28/2017 1104 - Adequate for Discharge by Milderd Meager, RN

## 2017-08-28 NOTE — Discharge Summary (Signed)
[facility] NAME: Meghan [name]    MR#:  [id number]  DATE OF BIRTH:  1946/05/30  DATE OF ADMISSION:  08/25/2017 ADMITTING PHYSICIAN: Saundra Shelling, MD  DATE OF DISCHARGE: 08/28/2017  PRIMARY CARE PHYSICIAN: Lavera Guise, MD    ADMISSION DIAGNOSIS:  Delirium [R41.0] Healthcare-associated pneumonia [J18.9] Acute pain of left thigh [M79.652]  DISCHARGE DIAGNOSIS:  Active Problems:   HCAP (healthcare-associated pneumonia)   SECONDARY DIAGNOSIS:   Past Medical History:  Diagnosis Date  . Anginal pain ([place])   . Anxiety   . Arthritis    RA  . Asthma   . Brain tumor (benign) ([place])   . CHF (congestive heart failure) ([place])   . Collagen vascular disease ([place])   . COPD (chronic obstructive pulmonary disease) ([place])   . Coronary artery disease   . DDD (degenerative disc disease)   . Depression   . Diabetes mellitus   . GERD (gastroesophageal reflux disease)   . Headache   . Heart murmur   . Heart murmur   . Hypercholesteremia   . Hypertension   . Lumbar degenerative disc disease   . Migraines   . Obesity   . Osteopenia   . Osteoporosis   . Pneumonia   . 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  1. Acute metabolic encephalopathy in the setting of pneumonia Mental status is at baseline  2. Acute hypoxic respiratory failure due to healthcare associated pneumonia: Patient has been weaned off of oxygen  3 Healthcare associated pneumonia: Patient was on cefepime and vancomycin. She will be discharged on Ceftin and azithromycin.   4. 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 COPD: Patient had no signs of exacerbation  6. Anxiety and depression/PTSD:  Patient will continue outpatient medications  7. Collagen vascular disorder: Patient will continue outpatient regimen including prednisone    DISCHARGE CONDITIONS AND DIET:  Stable diabetic/hert healthy diet  CONSULTS OBTAINED:    DRUG ALLERGIES:   Allergies  Allergen Reactions  . Amoxicillin Itching and Other (See Comments)    Has patient had a PCN reaction causing immediate rash, facial/tongue/throat swelling, SOB or lightheadedness with hypotension: Unknown Has patient had a PCN reaction causing severe rash involving mucus membranes or skin necrosis: Unknown Has patient had a PCN reaction that required hospitalization: Unknown Has patient had a PCN reaction occurring within the last 10 years: Unknown If all of the above answers are "NO", then may proceed with Cephalosporin use.   . Gabapentin Other (See Comments)    Pt states that it causes her BP to drop.   . Naproxen Hives  . Nsaids Other (See Comments)    Reaction:  Unknown     DISCHARGE MEDICATIONS:   Allergies as of 08/28/2017      Reactions   Amoxicillin Itching, Other (See Comments)   Has patient had a PCN reaction causing immediate rash, facial/tongue/throat swelling, SOB or lightheadedness with hypotension: Unknown Has patient had a PCN reaction causing severe rash involving mucus membranes or skin necrosis: Unknown Has patient had a PCN reaction that required hospitalization: Unknown Has patient had a PCN reaction occurring within the last 10 years: Unknown If all of the above answers are "NO", then  may proceed with Cephalosporin use.   Gabapentin Other (See Comments)   Pt states that it causes her BP to drop.    Naproxen Hives   Nsaids Other (See Comments)   Reaction:  Unknown       Medication List    TAKE these medications   azithromycin 500 MG tablet Commonly known as:  ZITHROMAX Take 1 tablet (500 mg total) by mouth daily for 6 days. Take 1 tablet daily for 6 days.   busPIRone 7.5 MG  tablet Commonly known as:  BUSPAR Take 1 tablet by mouth 2 (two) times daily.   cefUROXime 500 MG tablet Commonly known as:  CEFTIN Take 1 tablet (500 mg total) by mouth 2 (two) times daily with a meal.   Cholecalciferol 5000 units Tabs Take 5,000 Units by mouth every 30 (thirty) days.   clonazePAM 0.5 MG tablet Commonly known as:  KLONOPIN Take 0.125 mg by mouth 2 (two) times daily.   COMBIVENT RESPIMAT 20-100 MCG/ACT Aers respimat Generic drug:  Ipratropium-Albuterol Inhale 2 puffs into the lungs every 6 (six) hours as needed for wheezing.   COMBIVENT RESPIMAT 20-100 MCG/ACT Aers respimat Generic drug:  Ipratropium-Albuterol Inhale 2 puffs into the lungs every 6 (six) hours.   cyanocobalamin 500 MCG tablet Take 500 mcg by mouth daily.   diclofenac sodium 1 % Gel Commonly known as:  VOLTAREN Apply 2 g topically 3 (three) times daily. Apply to the fingers   diclofenac sodium 1 % Gel Commonly known as:  VOLTAREN Apply 4 g topically 4 (four) times daily. Apply to the left knee   diphenoxylate-atropine 2.5-0.025 MG tablet Commonly known as:  LOMOTIL Take 1 tablet by mouth every 6 (six) hours as needed for diarrhea or loose stools.   escitalopram 10 MG tablet Commonly known as:  LEXAPRO Take 1 tablet (10 mg total) by mouth every morning.   fluticasone 27.5 MCG/SPRAY nasal spray Commonly known as:  VERAMYST Place 2 sprays into the nose 2 (two) times daily.   folic acid 1 MG tablet Commonly known as:  FOLVITE Take 1 mg by mouth daily.   furosemide 40 MG tablet Commonly known as:  LASIX Take 40 mg by mouth 2 (two) times daily.   guaiFENesin 100 MG/5ML Soln Commonly known as:  ROBITUSSIN Take 20 mLs by mouth every 6 (six) hours.   lamoTRIgine 100 MG tablet Commonly known as:  LAMICTAL Take 1 tablet (100 mg total) by mouth daily.   lisinopril 2.5 MG tablet Commonly known as:  PRINIVIL,ZESTRIL Take 1 tablet by mouth 2 (two) times daily.   loperamide 2 MG  tablet Commonly known as:  IMODIUM A-D Take 1 tablet (2 mg total) by mouth 4 (four) times daily as needed for diarrhea or loose stools. What changed:  when to take this   loratadine 10 MG tablet Commonly known as:  CLARITIN Take 10 mg by mouth daily.   LYRICA 50 MG capsule Generic drug:  pregabalin TAKE ONE CAPSULE BY MOUTH TWICE A DAY   methotrexate 7.5 MG tablet Commonly known as:  RHEUMATREX Take 7.5 mg by mouth once a week. Caution" Chemotherapy. Protect from light.   metoprolol tartrate 25 MG tablet Commonly known as:  LOPRESSOR Take 25 mg by mouth 2 (two) times daily.   mirtazapine 7.5 MG tablet Commonly known as:  REMERON Take 7.5 mg by mouth at bedtime.   montelukast 10 MG tablet Commonly known as:  SINGULAIR Take 10 mg by mouth daily.   omeprazole 20 MG  capsule Commonly known as:  PRILOSEC Take 20 mg by mouth daily.   ONGLYZA 5 MG Tabs tablet Generic drug:  saxagliptin HCl Take 5 mg by mouth at bedtime.   oxyCODONE-acetaminophen 5-325 MG tablet Commonly known as:  PERCOCET/ROXICET Take 1 tablet every 4 (four) hours as needed by mouth for moderate pain or severe pain.   Potassium Chloride ER 20 MEQ Tbcr Take 20 mEq by mouth 2 (two) times daily.   pramipexole 0.25 MG tablet Commonly known as:  MIRAPEX Take 0.25 mg by mouth daily.   predniSONE 5 MG tablet Commonly known as:  DELTASONE Take 5 mg by mouth daily with breakfast.   promethazine 25 MG suppository Commonly known as:  PHENERGAN Place 1 suppository (25 mg total) rectally every 6 (six) hours as needed for nausea.   Tofacitinib Citrate 5 MG Tabs Take 5 mg by mouth 2 (two) times daily after a meal.   topiramate 25 MG tablet Commonly known as:  TOPAMAX Take 25 mg by mouth at bedtime.         Today   CHIEF COMPLAINT:   Patient feeling better this morning. No shortness of breath or fevers overnight   VITAL SIGNS:  Blood pressure (!) 146/73, pulse (!) 105, temperature 98.6 F (37 C),  temperature source Oral, resp. rate (!) 22, height 4\' 8"  (1.422 m), weight 60.4 kg (133 lb 3.2 oz), SpO2 99 %.   REVIEW OF SYSTEMS:  Review of Systems  Constitutional: Negative for chills, fever and malaise/fatigue.  HENT: Negative.  Negative for ear discharge, ear pain, hearing loss, nosebleeds and sore throat.   Eyes: Negative.  Negative for blurred vision and pain.  Respiratory: Negative.  Negative for cough, hemoptysis, shortness of breath and wheezing.   Cardiovascular: Negative.  Negative for chest pain, palpitations and leg swelling.  Gastrointestinal: Negative.  Negative for abdominal pain, blood in stool, diarrhea, nausea and vomiting.  Genitourinary: Negative.  Negative for dysuria.  Musculoskeletal: Negative.  Negative for back pain.  Skin: Negative.   Neurological: Positive for weakness. Negative for dizziness, tremors, speech change, focal weakness, seizures and headaches.  Endo/Heme/Allergies: Negative.  Does not bruise/bleed easily.  Psychiatric/Behavioral: Negative.  Negative for depression, hallucinations and suicidal ideas.     PHYSICAL EXAMINATION:  GENERAL:  72 y.o.-year-old patient lying in the bed with no acute distress.  NECK:  Supple, no jugular venous distention. No thyroid enlargement, no tenderness.  LUNGS: Crackles at the left base greater than right. No wheezing or rhonchi  CARDIOVASCULAR: S1, S2 normal. No murmurs, rubs, or gallops.  ABDOMEN: Soft, non-tender, non-distended. Bowel sounds present. No organomegaly or mass.  EXTREMITIES: No pedal edema, cyanosis, or clubbing.  PSYCHIATRIC: The patient is alert and oriented x 3.  SKIN: No obvious rash, lesion, or ulcer.   DATA REVIEW:   CBC Recent Labs  Lab 08/26/17 0646  WBC 5.7  HGB 8.9*  HCT 28.2*  PLT 187    Chemistries  Recent Labs  Lab 08/25/17 2241 08/26/17 0646 08/27/17 0353  NA 139 138 141  K 4.5 3.6 3.9  CL 103 104 108  CO2 26 27 25   GLUCOSE 123* 132* 85  BUN 23* 24* 23*   CREATININE 0.80 0.78 0.79  CALCIUM 8.7* 7.9* 8.4*  MG  --  1.6*  --   AST 26  --   --   ALT 22  --   --   ALKPHOS 134*  --   --   BILITOT 1.2  --   --  Cardiac Enzymes No results for input(s): TROPONINI in the last 168 hours.  Microbiology Results  @MICRORSLT48 @  RADIOLOGY:  No results found.    Allergies as of 08/28/2017      Reactions   Amoxicillin Itching, Other (See Comments)   Has patient had a PCN reaction causing immediate rash, facial/tongue/throat swelling, SOB or lightheadedness with hypotension: Unknown Has patient had a PCN reaction causing severe rash involving mucus membranes or skin necrosis: Unknown Has patient had a PCN reaction that required hospitalization: Unknown Has patient had a PCN reaction occurring within the last 10 years: Unknown If all of the above answers are "NO", then may proceed with Cephalosporin use.   Gabapentin Other (See Comments)   Pt states that it causes her BP to drop.    Naproxen Hives   Nsaids Other (See Comments)   Reaction:  Unknown       Medication List    TAKE these medications   azithromycin 500 MG tablet Commonly known as:  ZITHROMAX Take 1 tablet (500 mg total) by mouth daily for 6 days. Take 1 tablet daily for 6 days.   busPIRone 7.5 MG tablet Commonly known as:  BUSPAR Take 1 tablet by mouth 2 (two) times daily.   cefUROXime 500 MG tablet Commonly known as:  CEFTIN Take 1 tablet (500 mg total) by mouth 2 (two) times daily with a meal.   Cholecalciferol 5000 units Tabs Take 5,000 Units by mouth every 30 (thirty) days.   clonazePAM 0.5 MG tablet Commonly known as:  KLONOPIN Take 0.125 mg by mouth 2 (two) times daily.   COMBIVENT RESPIMAT 20-100 MCG/ACT Aers respimat Generic drug:  Ipratropium-Albuterol Inhale 2 puffs into the lungs every 6 (six) hours as needed for wheezing.   COMBIVENT RESPIMAT 20-100 MCG/ACT Aers respimat Generic drug:  Ipratropium-Albuterol Inhale 2 puffs into the lungs every 6  (six) hours.   cyanocobalamin 500 MCG tablet Take 500 mcg by mouth daily.   diclofenac sodium 1 % Gel Commonly known as:  VOLTAREN Apply 2 g topically 3 (three) times daily. Apply to the fingers   diclofenac sodium 1 % Gel Commonly known as:  VOLTAREN Apply 4 g topically 4 (four) times daily. Apply to the left knee   diphenoxylate-atropine 2.5-0.025 MG tablet Commonly known as:  LOMOTIL Take 1 tablet by mouth every 6 (six) hours as needed for diarrhea or loose stools.   escitalopram 10 MG tablet Commonly known as:  LEXAPRO Take 1 tablet (10 mg total) by mouth every morning.   fluticasone 27.5 MCG/SPRAY nasal spray Commonly known as:  VERAMYST Place 2 sprays into the nose 2 (two) times daily.   folic acid 1 MG tablet Commonly known as:  FOLVITE Take 1 mg by mouth daily.   furosemide 40 MG tablet Commonly known as:  LASIX Take 40 mg by mouth 2 (two) times daily.   guaiFENesin 100 MG/5ML Soln Commonly known as:  ROBITUSSIN Take 20 mLs by mouth every 6 (six) hours.   lamoTRIgine 100 MG tablet Commonly known as:  LAMICTAL Take 1 tablet (100 mg total) by mouth daily.   lisinopril 2.5 MG tablet Commonly known as:  PRINIVIL,ZESTRIL Take 1 tablet by mouth 2 (two) times daily.   loperamide 2 MG tablet Commonly known as:  IMODIUM A-D Take 1 tablet (2 mg total) by mouth 4 (four) times daily as needed for diarrhea or loose stools. What changed:  when to take this   loratadine 10 MG tablet Commonly known as:  CLARITIN  Take 10 mg by mouth daily.   LYRICA 50 MG capsule Generic drug:  pregabalin TAKE ONE CAPSULE BY MOUTH TWICE A DAY   methotrexate 7.5 MG tablet Commonly known as:  RHEUMATREX Take 7.5 mg by mouth once a week. Caution" Chemotherapy. Protect from light.   metoprolol tartrate 25 MG tablet Commonly known as:  LOPRESSOR Take 25 mg by mouth 2 (two) times daily.   mirtazapine 7.5 MG tablet Commonly known as:  REMERON Take 7.5 mg by mouth at bedtime.    montelukast 10 MG tablet Commonly known as:  SINGULAIR Take 10 mg by mouth daily.   omeprazole 20 MG capsule Commonly known as:  PRILOSEC Take 20 mg by mouth daily.   ONGLYZA 5 MG Tabs tablet Generic drug:  saxagliptin HCl Take 5 mg by mouth at bedtime.   oxyCODONE-acetaminophen 5-325 MG tablet Commonly known as:  PERCOCET/ROXICET Take 1 tablet every 4 (four) hours as needed by mouth for moderate pain or severe pain.   Potassium Chloride ER 20 MEQ Tbcr Take 20 mEq by mouth 2 (two) times daily.   pramipexole 0.25 MG tablet Commonly known as:  MIRAPEX Take 0.25 mg by mouth daily.   predniSONE 5 MG tablet Commonly known as:  DELTASONE Take 5 mg by mouth daily with breakfast.   promethazine 25 MG suppository Commonly known as:  PHENERGAN Place 1 suppository (25 mg total) rectally every 6 (six) hours as needed for nausea.   Tofacitinib Citrate 5 MG Tabs Take 5 mg by mouth 2 (two) times daily after a meal.   topiramate 25 MG tablet Commonly known as:  TOPAMAX Take 25 mg by mouth at bedtime.        Management plans discussed with the patient and she is in agreement. Stable for discharge [facility]  Patient should follow up with pcp  CODE STATUS:     Code Status Orders  (From admission, onward)        Start     Ordered   08/26/17 0558  Do not attempt resuscitation (DNR)  Continuous    Question Answer Comment  In the event of cardiac or respiratory ARREST Do not call a "code blue"   In the event of cardiac or respiratory ARREST Do not perform Intubation, CPR, defibrillation or ACLS   In the event of cardiac or respiratory ARREST Use medication by any route, position, wound care, and other measures to relive pain and suffering. May use oxygen, suction and manual treatment of airway obstruction as needed for comfort.      08/26/17 0557    Code Status History    Date Active Date Inactive Code Status Order ID Comments User Context   08/25/2017 23:03  08/26/2017 05:57 DNR [id number]  Hinda Kehr, MD ED   06/09/2017 22:02 06/15/2017 20:46 DNR [id number]  Demetrios Loll, MD Inpatient   05/05/2017 20:23 05/07/2017 20:16 Full Code [id number]  Baxter Hire, MD Inpatient   04/16/2017 10:15 04/19/2017 20:09 DNR [id number]  Vaughan Basta, MD Inpatient   03/20/2017 04:05 03/24/2017 20:08 Full Code [id number]  Lance Coon, MD Inpatient   02/27/2017 05:39 03/04/2017 18:14 Full Code [id number]  Saundra Shelling, MD Inpatient   02/22/2017 15:47 02/26/2017 23:05 Full Code [id number]  Vaughan Basta, MD Inpatient   03/01/2015 21:12 03/10/2015 20:17 Full Code [id number]  Demetrios Loll, MD Inpatient   12/24/2014 05:27 12/25/2014 18:32 DNR [id number]  Lance Coon, MD Inpatient    Advance Directive Documentation     Most Recent Value  Type of  Advance Directive  Out of facility DNR (pink MOST or yellow form)  Pre-existing out of facility DNR order (yellow form or pink MOST form)  Physician notified to receive inpatient order  "MOST" Form in Place?  No data      TOTAL TIME TAKING CARE OF THIS PATIENT: 38 minutes.    Note: This dictation was prepared with Dragon dictation along with smaller phrase technology. Any transcriptional errors that result from this process are unintentional.  Jahmarion Popoff M.D on 08/28/2017 at 9:02 AM  Between 7am to 6pm - Pager - [phone] After 6pm go to [email or web address] - password [email or web address] [facility]  Office  [phone]  CC: Primary care physician; Lavera Guise, MD

## 2017-08-31 LAB — CULTURE, BLOOD (ROUTINE X 2)
Culture: NO GROWTH
Culture: NO GROWTH
SPECIAL REQUESTS: ADEQUATE
SPECIAL REQUESTS: ADEQUATE

## 2017-11-11 IMAGING — CR DG THORACIC SPINE 2V
1 series · 3 of 3 positions shown · non-contrast
Comparison: 03/01/2015

CLINICAL DATA: Severe back pain.

EXAM:
THORACIC SPINE 2 VIEWS

[Series 1: dg thoracic spine 2 view · 0.14mm/px · 3 of 3 slices shown]
[im 1/3]
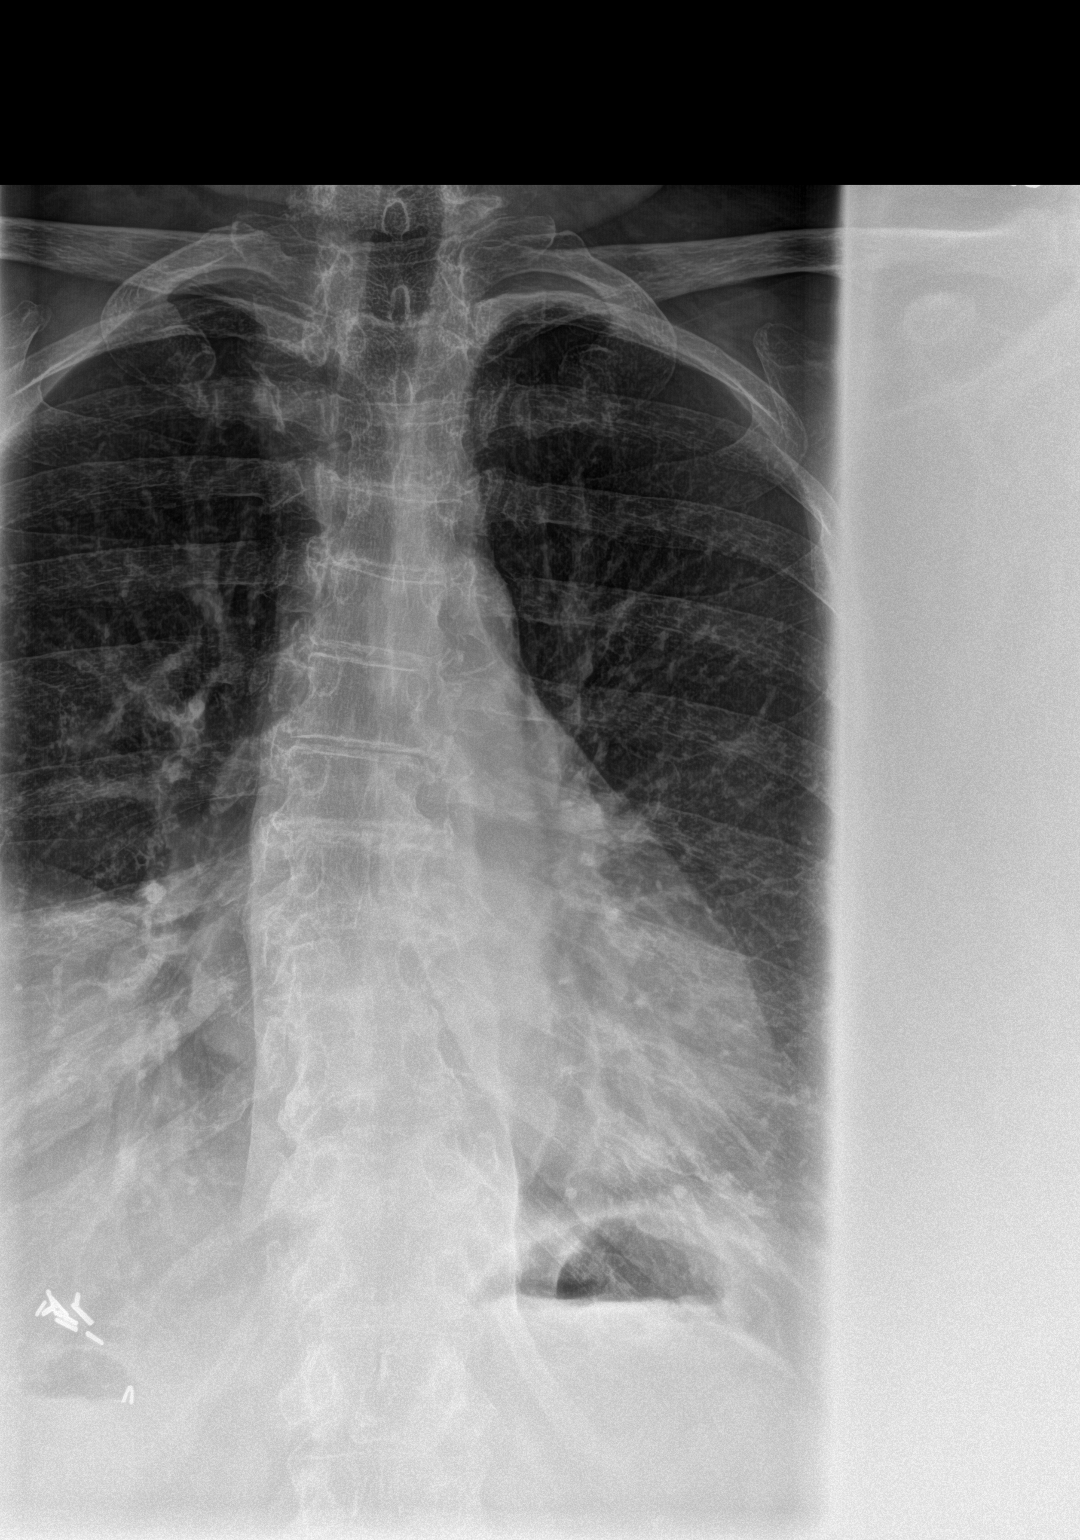
[im 2/3]
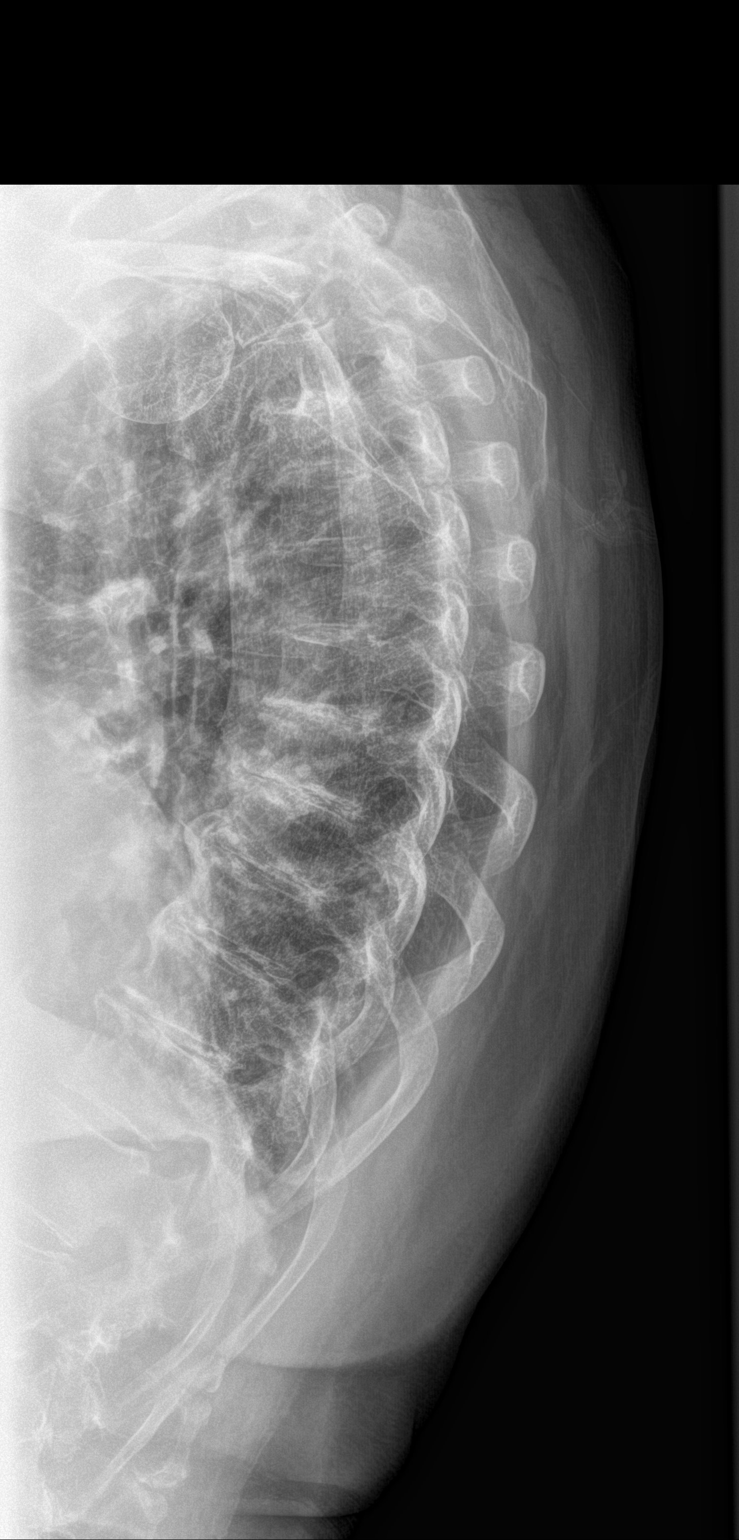
[im 3/3]
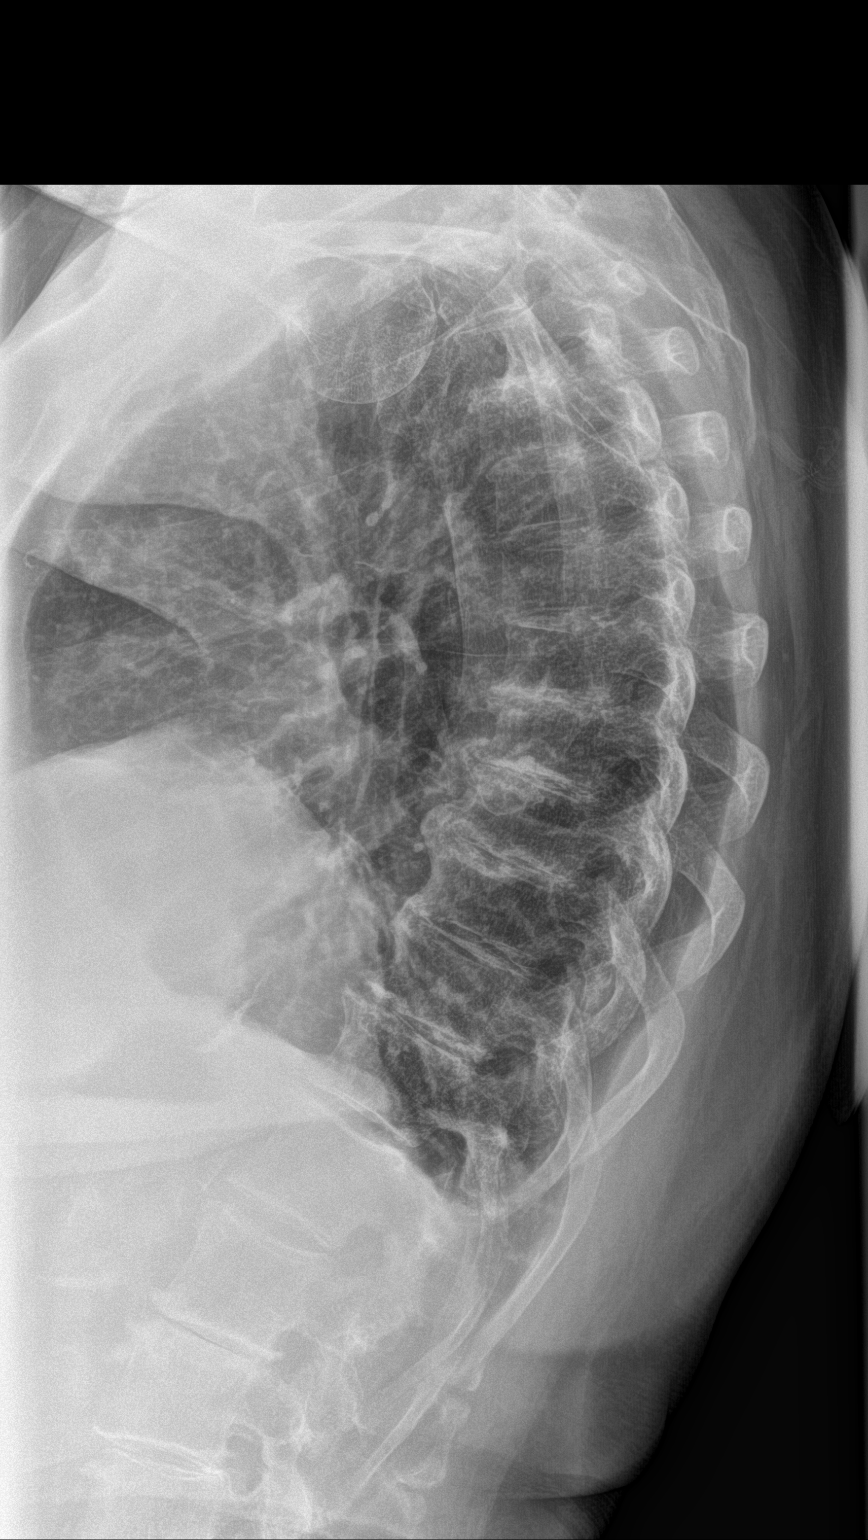

[3 of 3 positions shown; findings below may reference images not displayed]

FINDINGS: No acute fracture or traumatic malalignment is noted. Vertebral body
height is stable compared to previous chest x-ray. Exaggerated
thoracic kyphosis and mild dextroscoliosis with mid and lower
thoracic predominant degenerative disc narrowing and spondylosis.
Hyperinflation and chronic eventration of the right diaphragm.
IMPRESSION: 1. No acute finding with compared to prior.
2. Degenerative kyphoscoliosis.

## 2017-11-11 IMAGING — CT CT CERVICAL SPINE W/O CM
3 of 4 series · 11 of 33 positions shown, 13 images · non-contrast
Comparison: Cervical spine radiographs 01/12/2005

CLINICAL DATA: Neck pain extending into the shoulders and lower
back after helping a family member into bed. Initial encounter.

EXAM:
CT CERVICAL SPINE WITHOUT CONTRAST
TECHNIQUE: Multidetector CT imaging of the cervical spine was performed without
intravenous contrast. Multiplanar CT image reconstructions were also
generated.

[Series 4: sagittal bone · sagittal · 0.23mm/px · 5 of 42 slices shown, 6 images]
[im 14/42  bone]
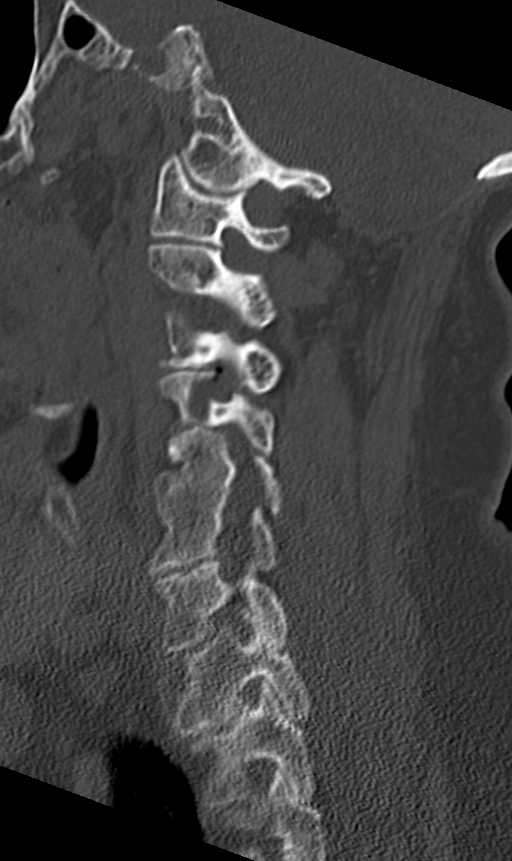
[im 18/42  bone]
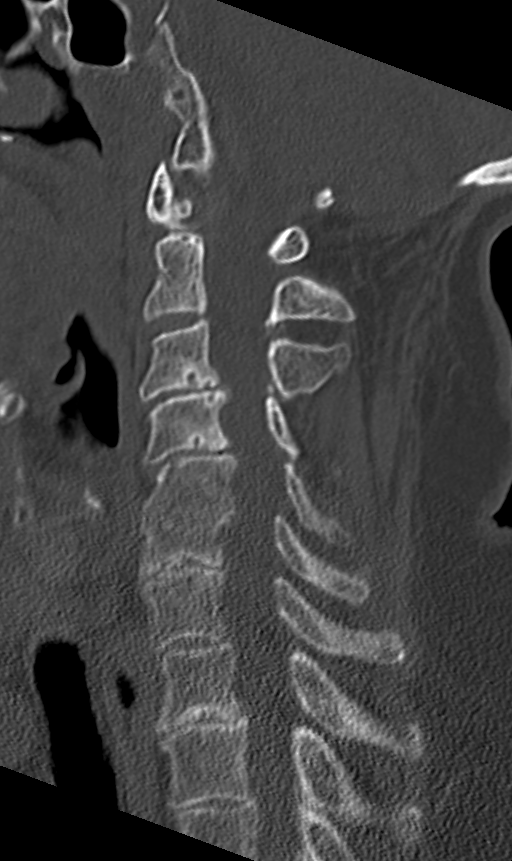
[im 21/42  soft-tissue]
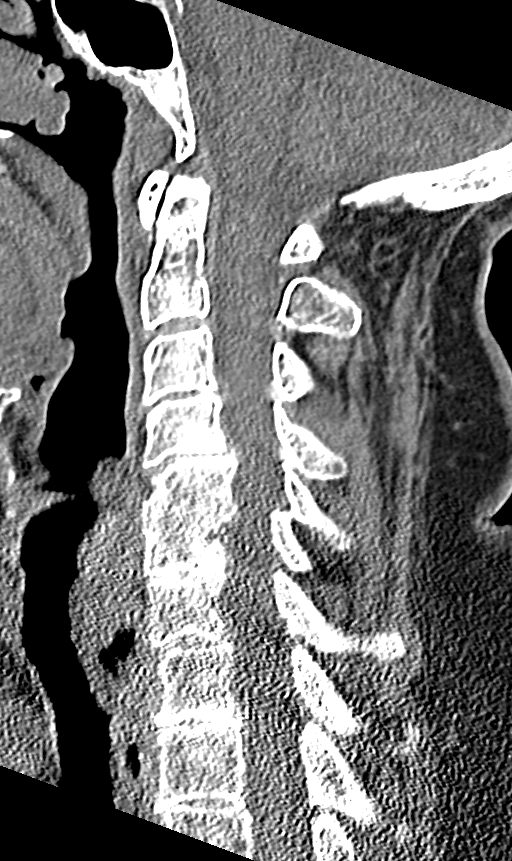
[im 21/42  bone]
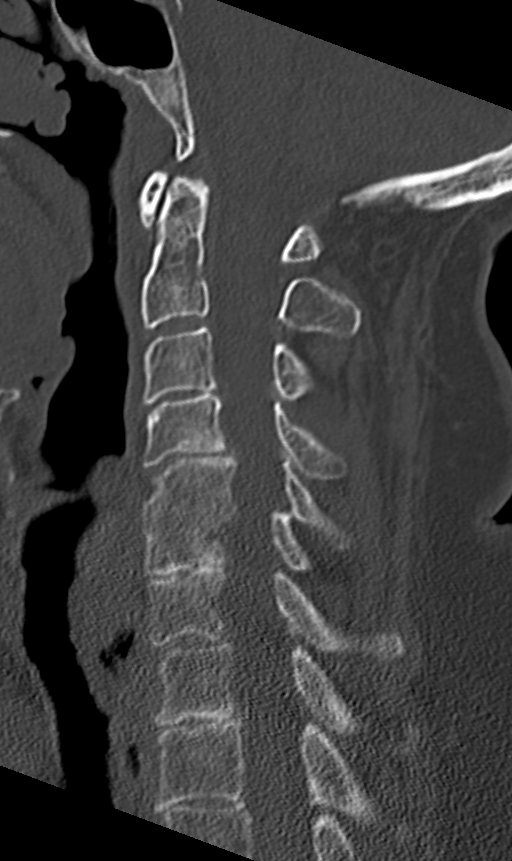
[im 24/42  bone]
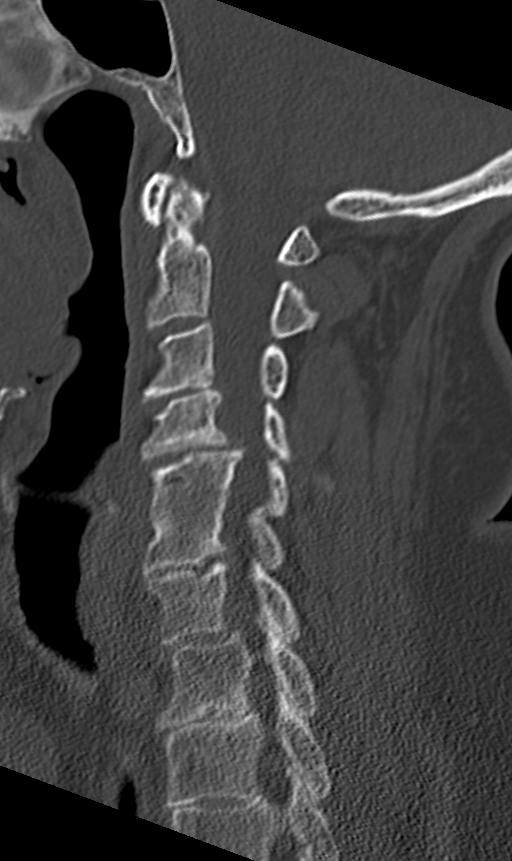
[im 28/42  bone]
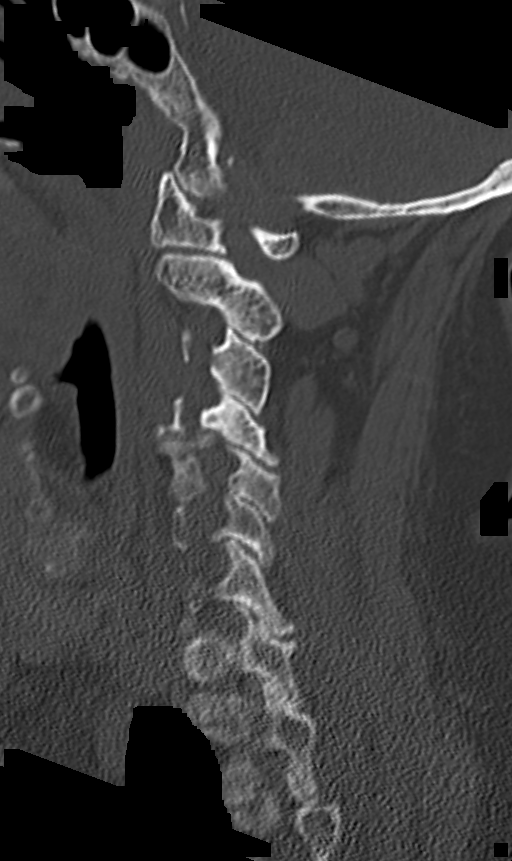

[Series 5: coronal bone · coronal · 0.23mm/px · 3 of 50 slices shown]
[im 10/50  bone]
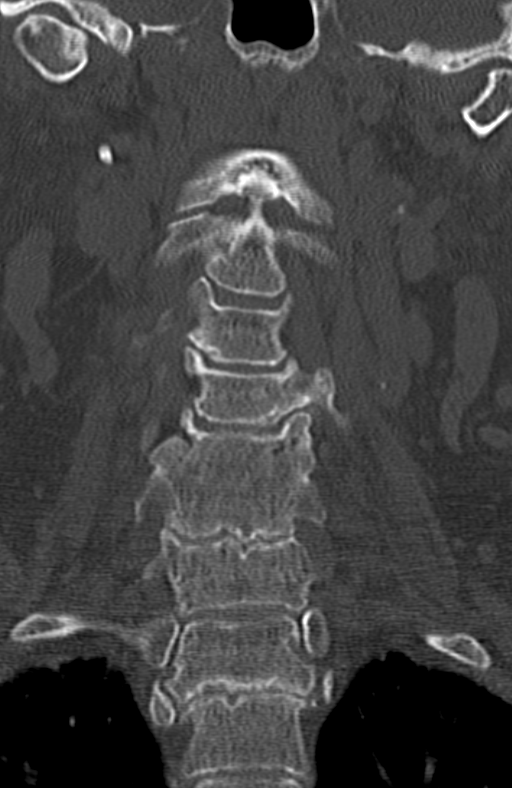
[im 20/50  bone]
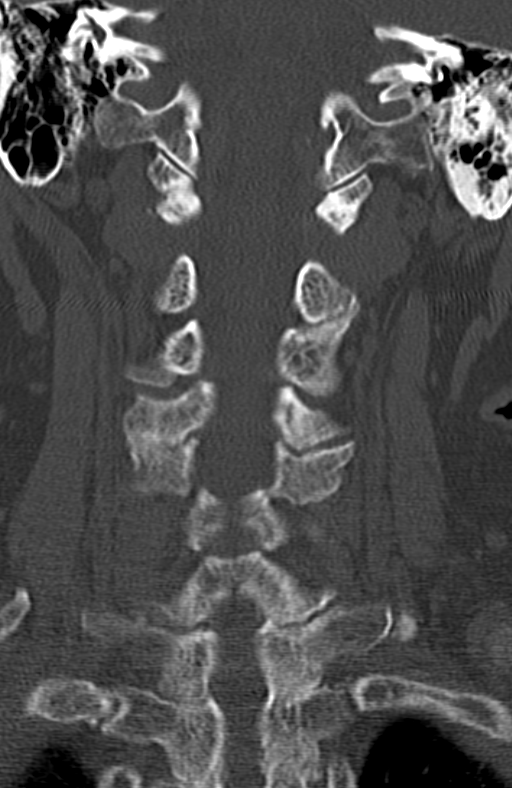
[im 30/50  bone]
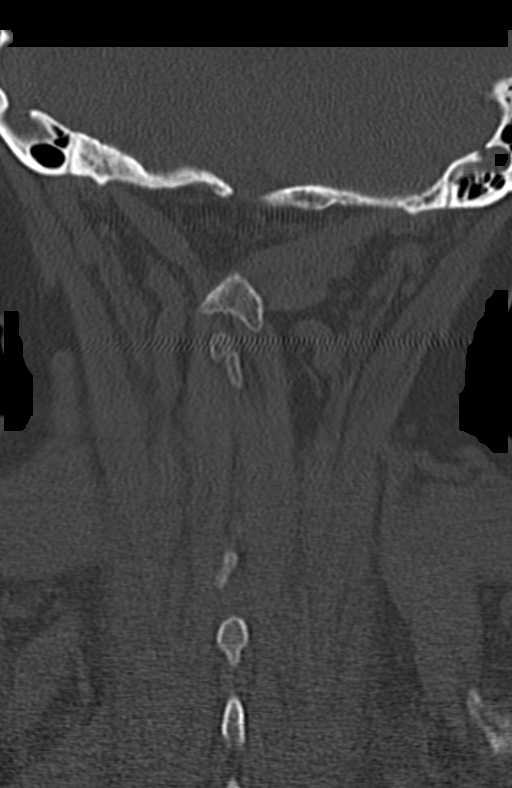

[Series 6: axial · axial · 0.22mm/px · z∈[-216,-114]mm · 3 of 86 slices shown, 4 images]
[im 15/86  soft-tissue]
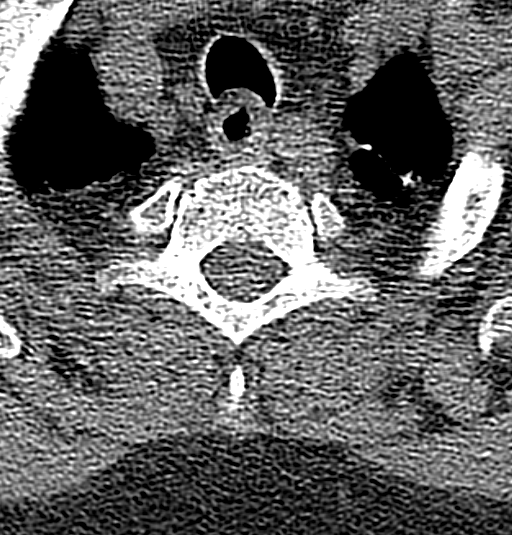
[im 15/86  bone]
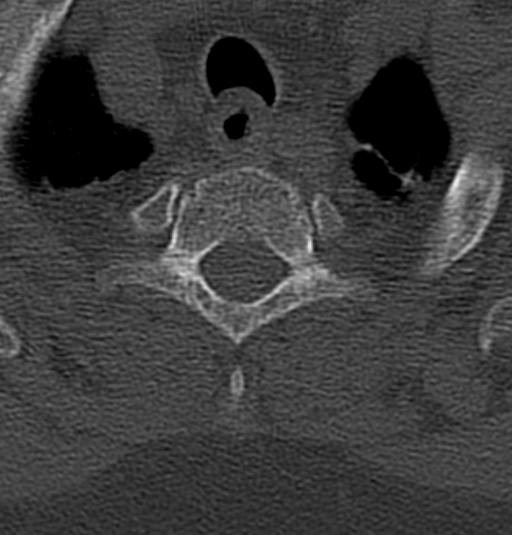
[im 43/86  bone]
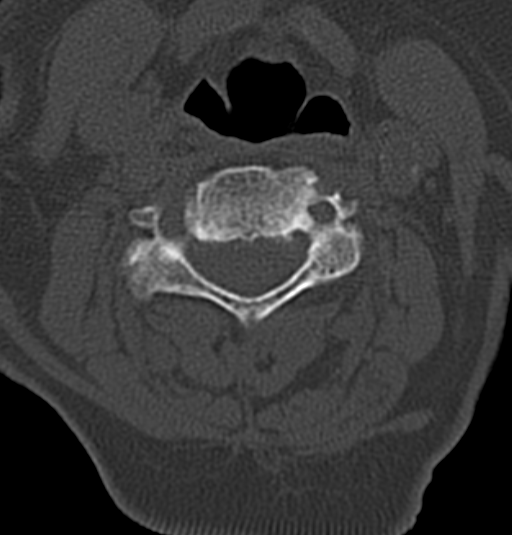
[im 71/86  bone]
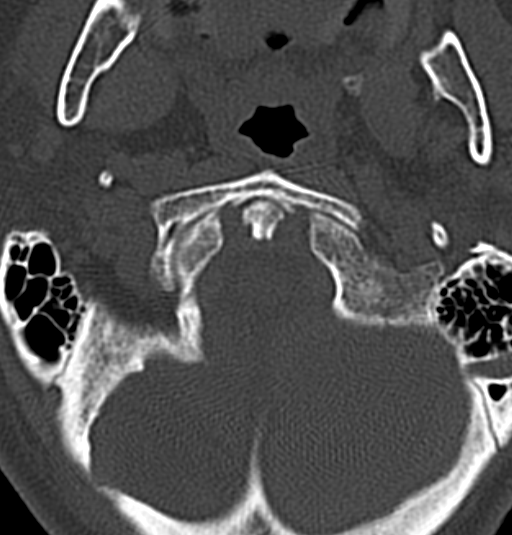

[11 of 33 positions shown; findings below may reference images not displayed]

FINDINGS: There is chronic straightening of the normal cervical lordosis with
slight anterolisthesis of C4 on C5 which appears degenerative and
facet mediated. The there is solid intervertebral osseous fusion at
C5-6 with left-sided facet ankylosis at this level as well. No acute
cervical spine fracture is identified.

Disc space narrowing is moderate at C3-4 in C4-5 and moderate to
severe at C6-7 and T1-2. Uncovertebral spurring results in
mild-to-moderate bilateral neural foraminal stenosis at C3-4 with
disc bulging resulting in mild spinal stenosis. There is also mild
spinal stenosis and moderate left neural foraminal stenosis at C4-5
due to disc uncovering, uncovertebral spurring, and left facet
arthrosis. Mild-to-moderate left foraminal stenosis is noted at C6-7
due to uncovertebral spurring.

Sequelae of prior right suboccipital craniectomy are again
identified with underlying cerebellar encephalomalacia. Left greater
than right carotid bifurcation atherosclerosis is noted.
IMPRESSION: 1. No evidence of acute osseous abnormality.
2. Solid C5-6 fusion.
3. Multilevel disc and facet degeneration with adjacent segment
disease as above.

## 2017-11-11 IMAGING — CR DG SHOULDER 2+V*R*
1 series · 3 of 3 positions shown · non-contrast
Comparison: 05/18/2008

CLINICAL DATA: Right shoulder pain.  Initial encounter.

EXAM:
RIGHT SHOULDER - 2+ VIEW

[Series 1: dg shoulder right · 0.14mm/px · 3 of 3 slices shown]
[im 1/3]
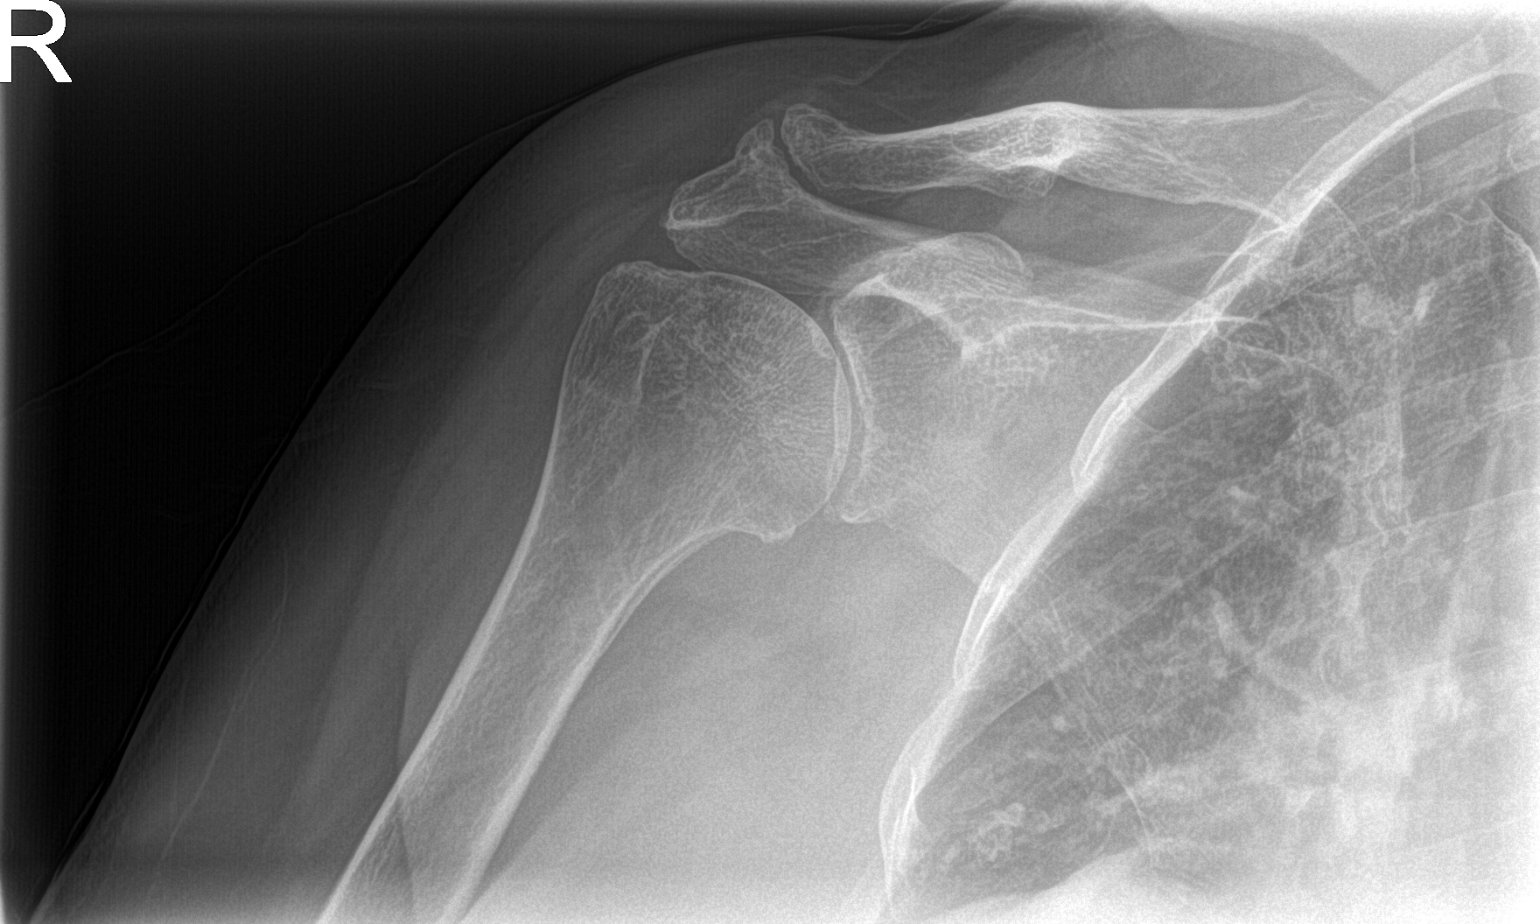
[im 2/3]
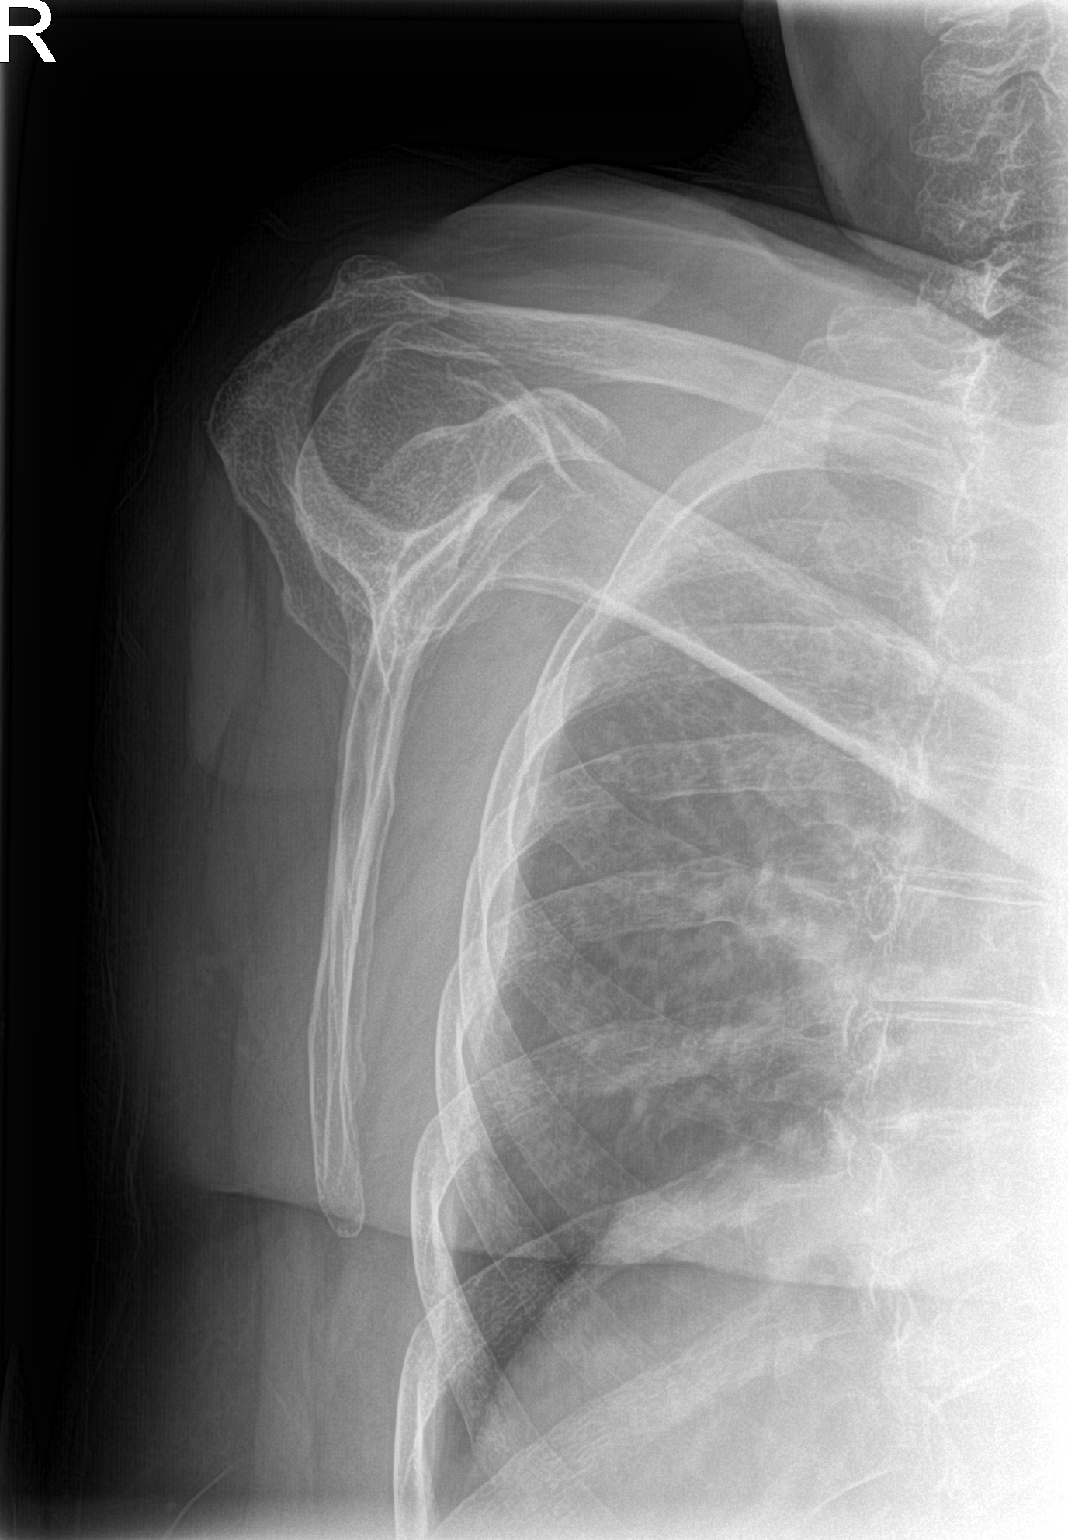
[im 3/3]
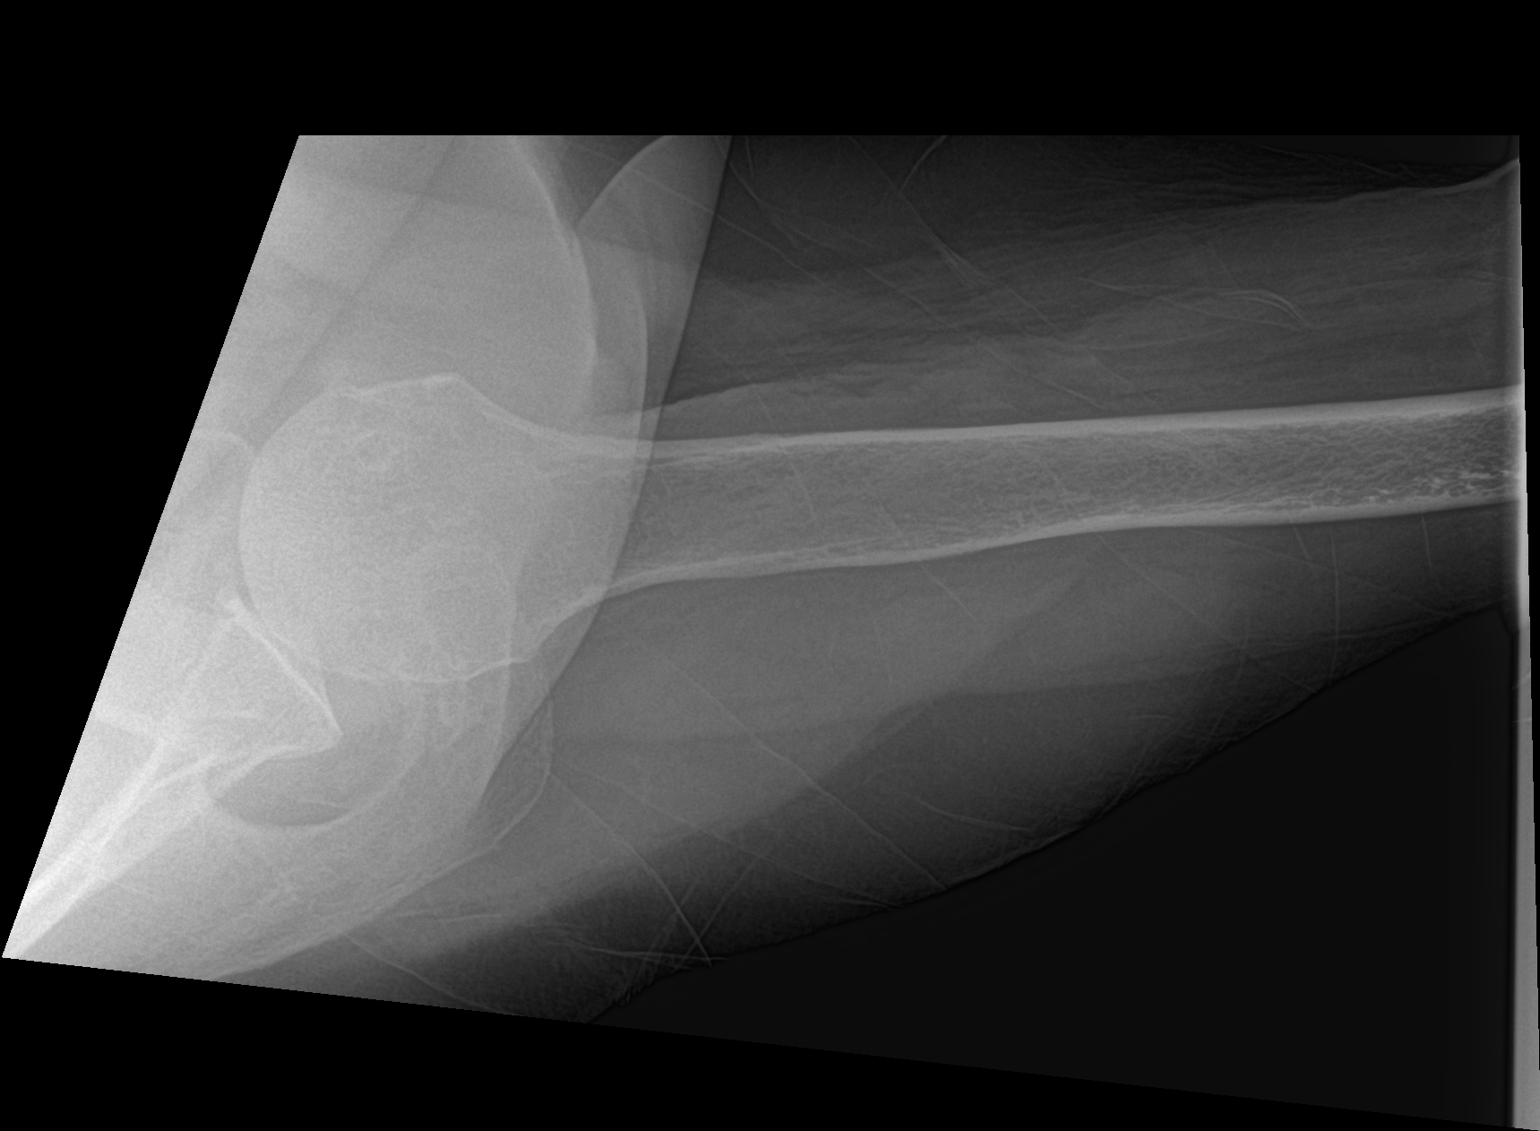

[3 of 3 positions shown; findings below may reference images not displayed]

FINDINGS: There is no evidence of fracture or dislocation. Glenohumeral
osteoarthritis with spurring and joint narrowing. Acromioclavicular
osteoarthritis with upward spurring. Osteopenia.
IMPRESSION: 1. No acute finding.
2. Glenohumeral and acromioclavicular osteoarthritis.

## 2018-01-30 IMAGING — MR MR LUMBAR SPINE W/O CM
5 series · 38 of 48 positions shown · non-contrast
Comparison: 08/29/2014.

CLINICAL DATA: Low back pain. No known injury. Symptoms worse over
the past few months. Worse with walking.

EXAM:
MRI LUMBAR SPINE WITHOUT CONTRAST
TECHNIQUE: Multiplanar, multisequence MR imaging of the lumbar spine was
performed. No intravenous contrast was administered.

[Series 2: T2 · sagittal · 4.0mm · 0.81mm/px · 6 of 19 slices shown (1 of 2)]
[im 1/19]
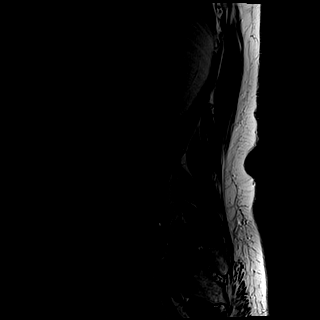
[im 4/19]
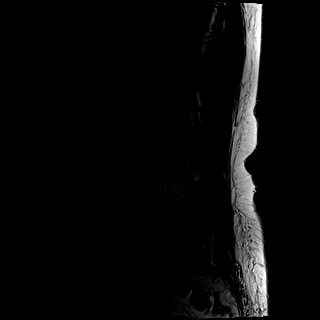
[im 8/19]
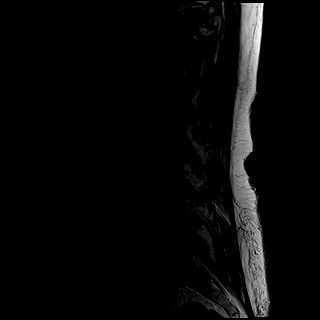
[im 11/19]
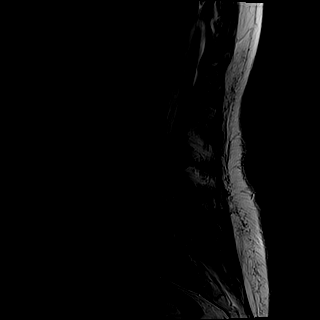
[im 15/19]
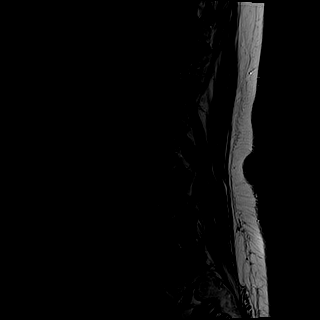
[im 19/19]
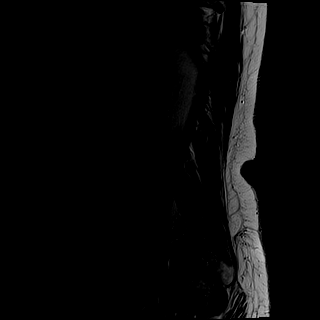

[Series 3: T1 · sagittal · 4.0mm · 0.81mm/px · 7 of 19 slices shown (1 of 2)]
[im 1/19]
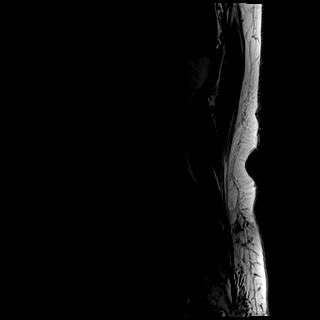
[im 4/19]
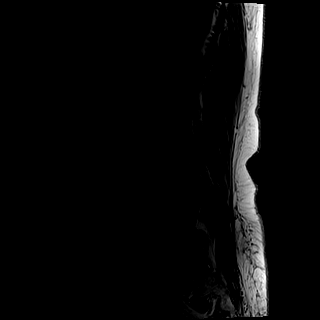
[im 7/19]
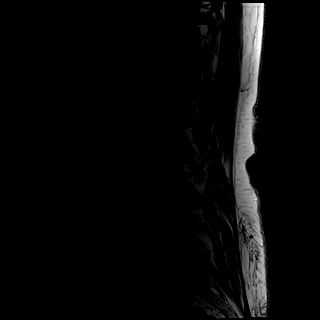
[im 10/19]
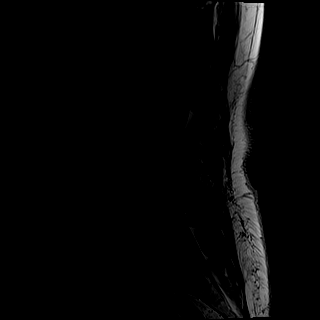
[im 13/19]
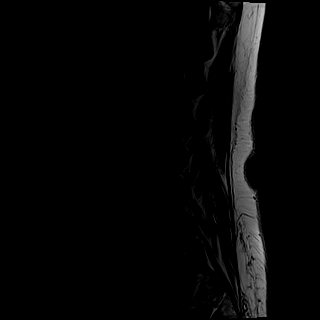
[im 16/19]
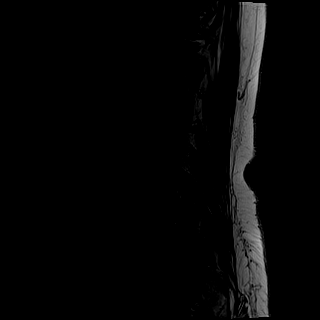
[im 19/19]
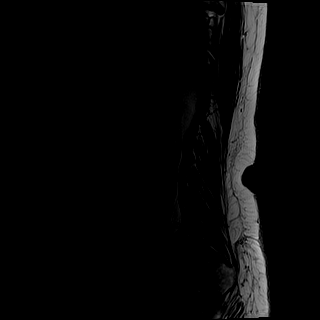

[Series 4: STIR · sagittal · 4.0mm · 1.02mm/px · 7 of 19 slices shown]
[im 1/19]
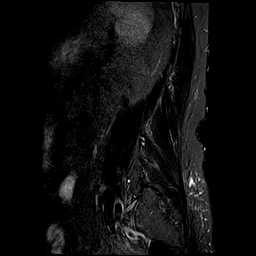
[im 4/19]
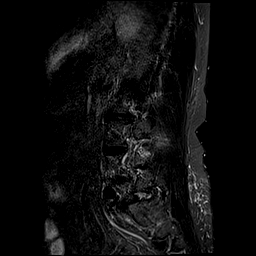
[im 7/19]
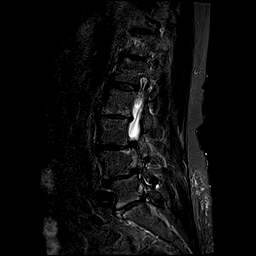
[im 10/19]
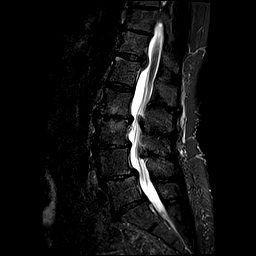
[im 13/19]
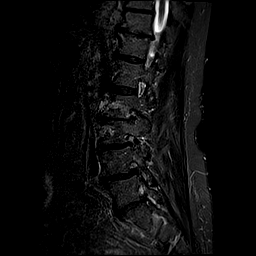
[im 16/19]
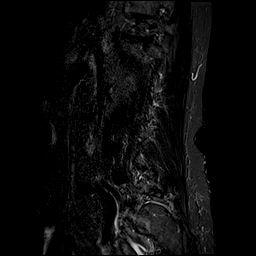
[im 19/19]
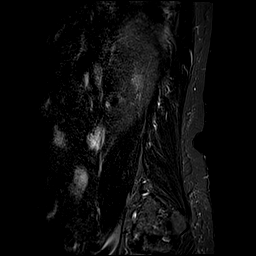

[Series 5: T2 · axial · 4.0mm · 0.78mm/px · z∈[-31,+175]mm · 10 of 38 slices shown (2 of 2)]
[im 1/38]
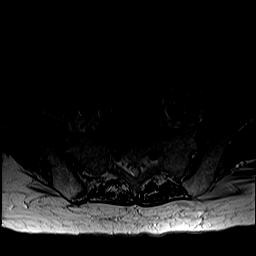
[im 3/38]
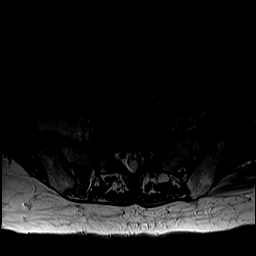
[im 6/38]
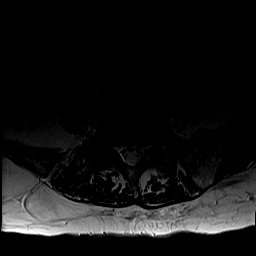
[im 9/38]
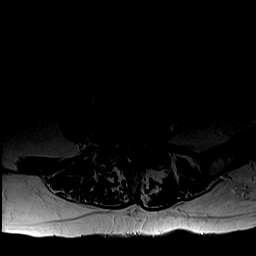
[im 12/38]
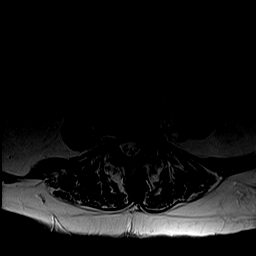
[im 18/38]
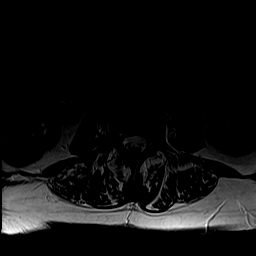
[im 20/38]
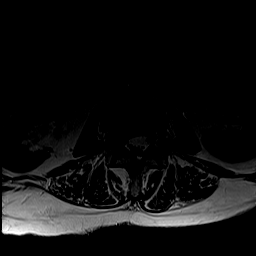
[im 26/38]
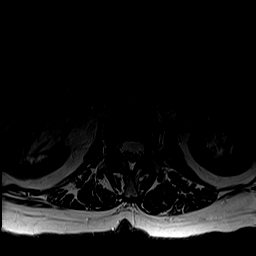
[im 32/38]
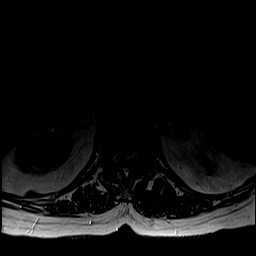
[im 38/38]
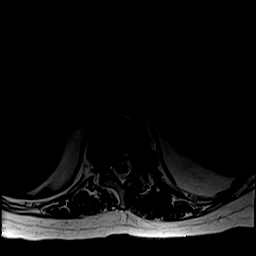

[Series 6: T1 · axial · 4.0mm · 0.39mm/px · z∈[-31,+175]mm · 8 of 38 slices shown (2 of 2)]
[im 1/38]
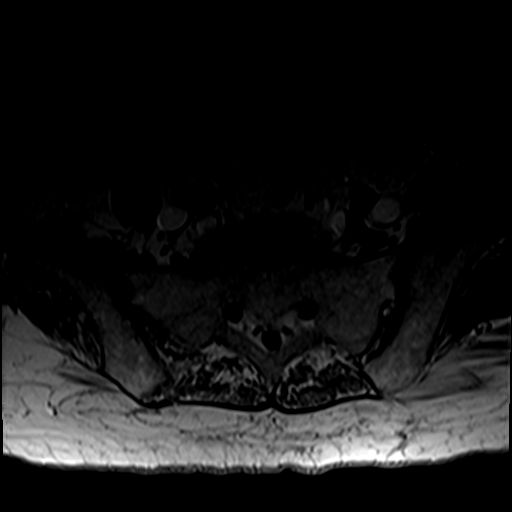
[im 6/38]
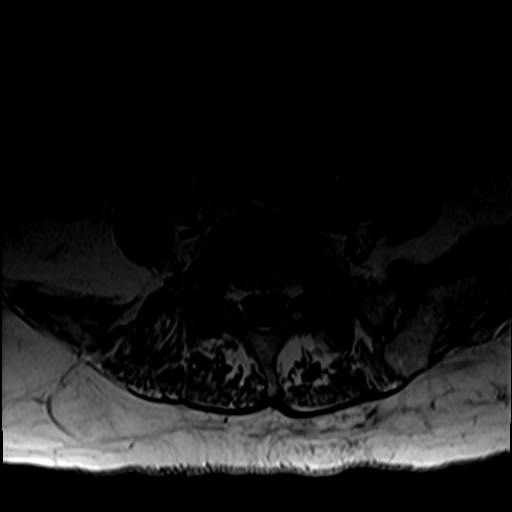
[im 12/38]
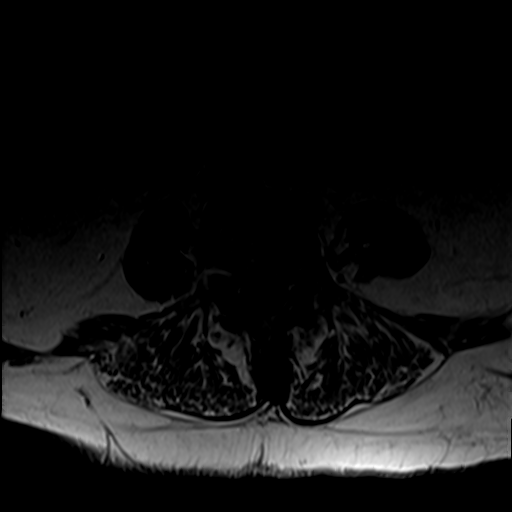
[im 18/38]
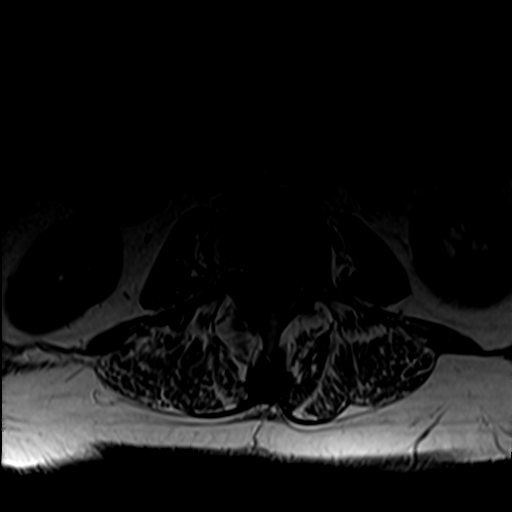
[im 20/38]
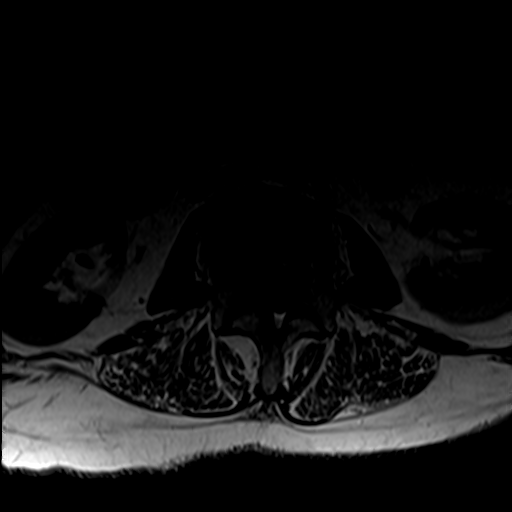
[im 26/38]
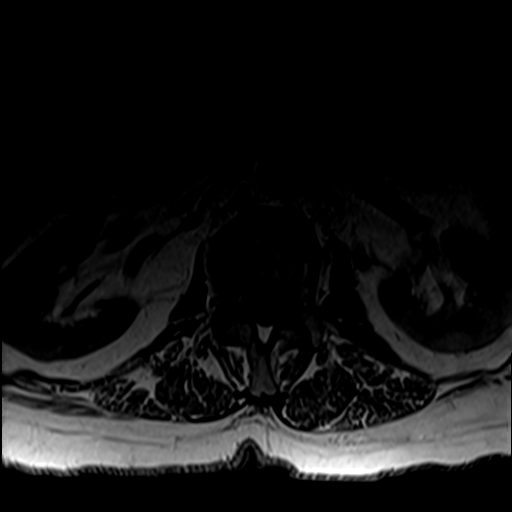
[im 32/38]
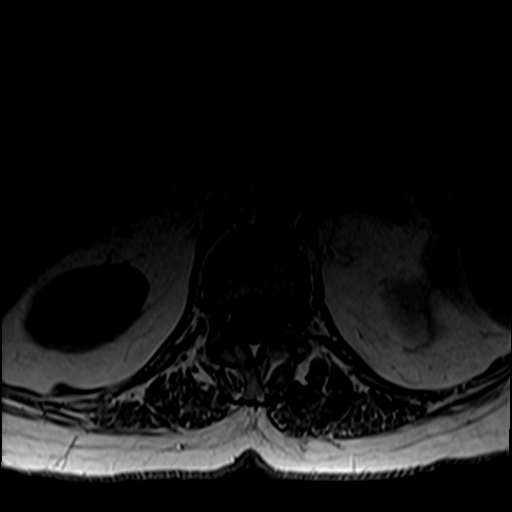
[im 38/38]
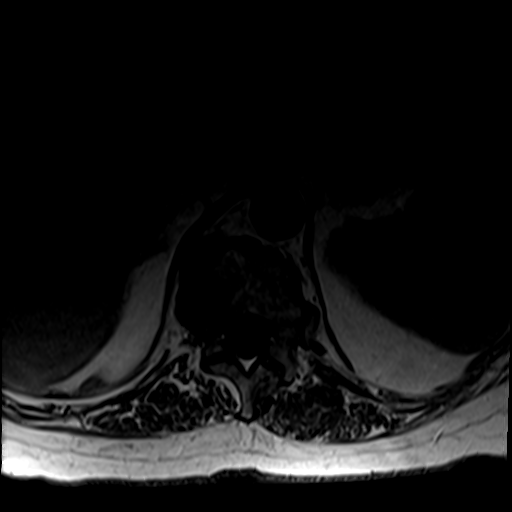

[38 of 48 positions shown; findings below may reference images not displayed]

FINDINGS: Segmentation:  Standard.

Alignment: Anterolisthesis of 4 mm at L3-4, 7 mm at L4-5, and 3 mm
at L5-S1. Consider standing lateral flexion extension views to
evaluate for dynamic instability.

Vertebrae: Endplate reactive changes most notable at L2-L3, but no
worrisome osseous lesion.

Conus medullaris: Extends to the L1 level and appears normal.

Paraspinal and other soft tissues: Unremarkable

Disc levels:

T12-L1: Large disc extrusion centrally. This projects into the
ventral epidural space. No conus compression.

L1-L2:  Unremarkable.

L2-L3: Severe disc space narrowing. Broad-based disc protrusion with
osseous ridging. Facet arthropathy. RIGHT greater than LEFT L2 and
L3 nerve root impingement.

L3-L4: Moderate to severe disc space narrowing. 4 mm
anterolisthesis. Uncovering of the disc with superimposed
protrusion. Facet arthropathy, worse on the LEFT. Mild to moderate
stenosis. LEFT greater than RIGHT L4 and L3 nerve root impingement.

L4-L5: Severe disc space narrowing. 7 mm anterolisthesis. Uncovering
of the disc with central and leftward extrusion. Advanced posterior
element hypertrophy. Ligamentum flavum hypertrophy greater on the
LEFT. LEFT greater than RIGHT L4 and L5 nerve root impingement.

L5-S1: Mild to moderate disc space narrowing. 3 mm anterolisthesis.
Central protrusion. Facet arthropathy. L5 and S1 nerve root
impingement is possible.

Compared with 0171, the stenosis at L4-5 is worse related to
increasing posterior element hypertrophy, increasing slip, and
slightly larger disc extrusion. Central extrusion at T12-L1 is
similar. The slip, facet arthropathy, and central disc material at
L3-4 and L5-S1 appears similar.
IMPRESSION: Multilevel spondylosis as described. Potentially symptomatic neural
impingement at multiple levels, worst at L4-5.

Progression of stenosis at L4-5 most notably since August 2014 MR.

## 2018-03-13 ENCOUNTER — Emergency Department: Payer: Medicare Other

## 2018-03-13 ENCOUNTER — Other Ambulatory Visit: Payer: Self-pay

## 2018-03-13 ENCOUNTER — Emergency Department
Admission: EM | Admit: 2018-03-13 | Discharge: 2018-03-13 | Disposition: A | Discharge status: Nursing Home (Includes ICF) | Payer: Medicare Other | Attending: Emergency Medicine | Admitting: Emergency Medicine

## 2018-03-13 DIAGNOSIS — Y939 Activity, unspecified: Secondary | ICD-10-CM | POA: Diagnosis not present

## 2018-03-13 DIAGNOSIS — E119 Type 2 diabetes mellitus without complications: Secondary | ICD-10-CM | POA: Diagnosis not present

## 2018-03-13 DIAGNOSIS — Z96653 Presence of artificial knee joint, bilateral: Secondary | ICD-10-CM | POA: Diagnosis not present

## 2018-03-13 DIAGNOSIS — Y999 Unspecified external cause status: Secondary | ICD-10-CM | POA: Diagnosis not present

## 2018-03-13 DIAGNOSIS — Z95818 Presence of other cardiac implants and grafts: Secondary | ICD-10-CM | POA: Diagnosis not present

## 2018-03-13 DIAGNOSIS — I110 Hypertensive heart disease with heart failure: Secondary | ICD-10-CM | POA: Insufficient documentation

## 2018-03-13 DIAGNOSIS — Z86011 Personal history of benign neoplasm of the brain: Secondary | ICD-10-CM | POA: Diagnosis not present

## 2018-03-13 DIAGNOSIS — S4991XA Unspecified injury of right shoulder and upper arm, initial encounter: Secondary | ICD-10-CM | POA: Diagnosis present

## 2018-03-13 DIAGNOSIS — I2510 Atherosclerotic heart disease of native coronary artery without angina pectoris: Secondary | ICD-10-CM | POA: Diagnosis not present

## 2018-03-13 DIAGNOSIS — I5022 Chronic systolic (congestive) heart failure: Secondary | ICD-10-CM | POA: Insufficient documentation

## 2018-03-13 DIAGNOSIS — Y92122 Bedroom in nursing home as the place of occurrence of the external cause: Secondary | ICD-10-CM | POA: Diagnosis not present

## 2018-03-13 DIAGNOSIS — S42201A Unspecified fracture of upper end of right humerus, initial encounter for closed fracture: Secondary | ICD-10-CM

## 2018-03-13 DIAGNOSIS — J45909 Unspecified asthma, uncomplicated: Secondary | ICD-10-CM | POA: Diagnosis not present

## 2018-03-13 DIAGNOSIS — W06XXXA Fall from bed, initial encounter: Secondary | ICD-10-CM | POA: Diagnosis not present

## 2018-03-13 DIAGNOSIS — Z79899 Other long term (current) drug therapy: Secondary | ICD-10-CM | POA: Diagnosis not present

## 2018-03-13 MED ORDER — OXYCODONE-ACETAMINOPHEN 5-325 MG PO TABS
1.0000 | ORAL_TABLET | Freq: Once | ORAL | Status: AC
  Administered 2018-03-13: 1 via ORAL
  Filled 2018-03-13: qty 1

## 2018-03-13 MED ORDER — HYDROCODONE-ACETAMINOPHEN 5-325 MG PO TABS: 1.0000 | ORAL_TABLET | Freq: Four times a day (QID) | ORAL | 0 refills | Status: DC | PRN

## 2018-03-13 NOTE — Discharge Instructions (Signed)
Please seek medical attention for any high fevers, chest pain, shortness of breath, change in behavior, persistent vomiting, bloody stool or any other new or concerning symptoms.  

## 2018-03-13 NOTE — ED Notes (Signed)
Pt transported back to [facility] per [facility] at this time.

## 2018-03-13 NOTE — ED Provider Notes (Signed)
[facility] Emergency Department Provider Note  ____________________________________________   I have reviewed the triage vital signs and the nursing notes.   HISTORY  Chief Complaint Fall and Arm Pain   History limited by: Not Limited   HPI Meghan [name] is a 72 y.o. female who presents to the emergency department today from living facility because of concerns for right arm pain and proximal humerus fracture.  Apparently the patient had a fall earlier today.  She states that she got her feet caught in her covers.  She fell onto her right side.  She states she did hit her head but denies any loss of consciousness.  Apparently x-rays showed a proximal humerus fracture.  She denies any lower extremity pain.    Per medical record review patient has a history of CHF, COPD.  Past Medical History:  Diagnosis Date  . Anginal pain ([place])   . Anxiety   . Arthritis    RA  . Asthma   . Brain tumor (benign) ([place])   . CHF (congestive heart failure) ([place])   . Collagen vascular disease ([place])   . COPD (chronic obstructive pulmonary disease) ([place])   . Coronary artery disease   . DDD (degenerative disc disease)   . Depression   . Diabetes mellitus   . GERD (gastroesophageal reflux disease)   . Headache   . Heart murmur   . Heart murmur   . Hypercholesteremia   . Hypertension   . Lumbar degenerative disc disease   . Migraines   . Obesity   . Osteopenia   . Osteoporosis   . Pneumonia   . PTSD (post-traumatic stress disorder)     Patient Active Problem List   Diagnosis Date Noted  . Syncope 05/05/2017  . Acute on chronic systolic CHF (congestive heart failure) ([place]) 04/16/2017  . Acute lower UTI 04/16/2017  . Acute respiratory failure ([place]) 04/16/2017  . Pressure injury of skin 03/22/2017  . HCAP (healthcare-associated pneumonia) 03/20/2017  . Acute on chronic respiratory failure with hypoxia ([place]) 03/20/2017  . Acute respiratory failure with hypoxia  ([place]) 03/20/2017  . Near syncope 02/27/2017  . Dehydration 02/27/2017  . Colitis 02/22/2017  . Seizure ([place]) 01/03/2016  . Chronic tension-type headache, intractable 12/04/2015  . 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 Degeneration of intervertebral disc of lumbar region 07/15/2015  . 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 Asthma with acute exacerbation 03/10/2015  . Hypokalemia 03/01/2015  . Hyponatremia 03/01/2015  . DDD (degenerative disc disease), lumbar 01/31/2015  . Arthritis, degenerative 01/31/2015  . Rheumatoid arthritis with rheumatoid factor ([place]) 01/31/2015  . HTN (hypertension) 12/24/2014  . Sepsis ([place]) 12/24/2014  . Left knee pain 12/24/2014  . GERD (gastroesophageal reflux disease) 12/24/2014  . COPD (chronic obstructive pulmonary disease) ([place]) 12/24/2014  . Depression 12/24/2014  . Anxiety 12/24/2014  . Severe bipolar disorder with psychotic features, mood-congruent ([place]) 11/16/2014  . Neurosis, posttraumatic 11/16/2014  . H/O gastric ulcer 11/16/2014  . Barton's fracture of distal radius, closed 09/19/2014  . Neuritis or radiculitis due to rupture of lumbar intervertebral disc 05/11/2014  . Cervico-occipital neuralgia 03/26/2014  . Difficulty in walking 03/26/2014  . Difficulty in walking, not elsewhere classified 03/26/2014  . Cervical spine syndrome 01/26/2014  . Cephalalgia 01/09/2014  . Disordered sleep 01/09/2014  . BP (high blood pressure) 10/19/2013  . Adiposity 10/19/2013  . Cardiac murmur 10/19/2013  . PNA (pneumonia) 10/19/2013  . Breath shortness 10/19/2013  . Chronic obstructive pulmonary disease ([place]) 10/19/2013  .  Diabetes mellitus ([place]) 10/19/2013    Past Surgical History:  Procedure Laterality Date  . ABDOMINAL HYSTERECTOMY    . CERVICAL FUSION    . CHOLECYSTECTOMY    . COLONOSCOPY WITH PROPOFOL N/A 09/20/2015   Procedure: COLONOSCOPY WITH PROPOFOL;  Surgeon: Josefine Class, MD;  Location[facility] ENDOSCOPY;  Service:  Endoscopy;  Laterality: N/A;  . COLONOSCOPY WITH PROPOFOL N/A 01/27/2016   Procedure: COLONOSCOPY WITH PROPOFOL;  Surgeon: Manya Silvas, MD;  Location[facility] ENDOSCOPY;  Service: Endoscopy;  Laterality: N/A;  . FINGER ARTHROPLASTY  02/23/2012   Procedure: FINGER ARTHROPLASTY;  Surgeon: Cammie Sickle., MD;  Location: [facility];  Service: Orthopedics;  Laterality: Left;  Extensor carpi radialis longus to Extensor carpi ulnaris transfer, left Metaphalangeal reconstructions of index and long fingers,  . FOOT ARTHROPLASTY     toes x2 rt foot  . HAND RECONSTRUCTION  2011   right-multiple finger joint reconst  . JOINT REPLACEMENT     bilat knee replacements  . KYPHOPLASTY N/A 06/14/2017   Procedure: KYPHOPLASTY L1;  Surgeon: Hessie Knows, MD;  Location: [facility] ORS;  Service: Orthopedics;  Laterality: N/A;  . LOOP RECORDER INSERTION N/A 06/23/2017   Procedure: LOOP RECORDER INSERTION;  Surgeon: Isaias Cowman, MD;  Location: [facility] CV LAB;  Service: Cardiovascular;  Laterality: N/A;    Prior to Admission medications   Medication Sig Start Date End Date Taking? Authorizing Provider  busPIRone (BUSPAR) 7.5 MG tablet Take 1 tablet by mouth 2 (two) times daily.    [provider]  cefUROXime (CEFTIN) 500 MG tablet Take 1 tablet (500 mg total) by mouth 2 (two) times daily with a meal. 08/28/17   Bettey Costa, MD  Cholecalciferol 5000 units TABS Take 5,000 Units by mouth every 30 (thirty) days.    [provider]  clonazePAM (KLONOPIN) 0.5 MG tablet Take 0.125 mg by mouth 2 (two) times daily.    [provider]  cyanocobalamin 500 MCG tablet Take 500 mcg by mouth daily.    [provider]  diclofenac sodium (VOLTAREN) 1 % GEL Apply 2 g topically 3 (three) times daily. Apply to the fingers    [provider]  diclofenac sodium (VOLTAREN) 1 % GEL Apply 4 g topically 4 (four) times daily. Apply to the left knee    [provider]  diphenoxylate-atropine (LOMOTIL) 2.5-0.025 MG tablet Take 1 tablet by mouth every 6 (six) hours as needed for diarrhea or loose stools.     [provider]  escitalopram (LEXAPRO) 10 MG tablet Take 1 tablet (10 mg total) by mouth every morning. 09/11/16   Rainey Pines, MD  fluticasone (VERAMYST) 27.5 MCG/SPRAY nasal spray Place 2 sprays into the nose 2 (two) times daily.    [provider]  folic acid (FOLVITE) 1 MG tablet Take 1 mg by mouth daily.    [provider]  furosemide (LASIX) 40 MG tablet Take 40 mg by mouth 2 (two) times daily.    [provider]  Ipratropium-Albuterol (COMBIVENT RESPIMAT) 20-100 MCG/ACT AERS respimat Inhale 2 puffs into the lungs every 6 (six) hours as needed for wheezing.    [provider]  Ipratropium-Albuterol (COMBIVENT RESPIMAT) 20-100 MCG/ACT AERS respimat Inhale 2 puffs into the lungs every 6 (six) hours.    [provider]  lamoTRIgine (LAMICTAL) 100 MG tablet Take 1 tablet (100 mg total) by mouth daily. 06/02/16   Rainey Pines, MD  lisinopril (PRINIVIL,ZESTRIL) 2.5 MG tablet Take 1 tablet  by mouth 2 (two) times daily.    [provider]  loperamide (IMODIUM A-D) 2 MG tablet Take 1 tablet (2 mg total) by mouth 4 (four) times daily as needed for diarrhea or loose stools. Patient taking differently: Take 2 mg by mouth as needed for diarrhea or loose stools.  05/10/17   Eula Listen, MD  loratadine (CLARITIN) 10 MG tablet Take 10 mg by mouth daily.    [provider]  methotrexate (RHEUMATREX) 7.5 MG tablet Take 7.5 mg by mouth once a week. Caution" Chemotherapy. Protect from light.    [provider]  metoprolol tartrate (LOPRESSOR) 25 MG tablet Take 25 mg by mouth 2 (two) times daily.    [provider]  mirtazapine (REMERON) 7.5 MG tablet Take 7.5 mg by mouth at bedtime.     [provider]  montelukast (SINGULAIR) 10 MG tablet Take 10 mg by  mouth daily.    [provider]  omeprazole (PRILOSEC) 20 MG capsule Take 20 mg by mouth daily.     [provider]  oxyCODONE-acetaminophen (PERCOCET/ROXICET) 5-325 MG tablet Take 1 tablet every 4 (four) hours as needed by mouth for moderate pain or severe pain. 06/15/17   Henreitta Leber, MD  Potassium Chloride ER 20 MEQ TBCR Take 20 mEq by mouth 2 (two) times daily. 05/07/17   Vaughan Basta, MD  pramipexole (MIRAPEX) 0.25 MG tablet Take 0.25 mg by mouth daily.     [provider]  predniSONE (DELTASONE) 5 MG tablet Take 5 mg by mouth daily with breakfast.    [provider]  pregabalin (LYRICA) 50 MG capsule TAKE ONE CAPSULE BY MOUTH TWICE A DAY 06/18/15   [provider]  promethazine (PHENERGAN) 25 MG suppository Place 1 suppository (25 mg total) rectally every 6 (six) hours as needed for nausea. 05/10/17 05/10/18  Eula Listen, MD  saxagliptin HCl (ONGLYZA) 5 MG TABS tablet Take 5 mg by mouth at bedtime.    [provider]  Tofacitinib Citrate 5 MG TABS Take 5 mg by mouth 2 (two) times daily after a meal.     [provider]  topiramate (TOPAMAX) 25 MG tablet Take 25 mg by mouth at bedtime.     [provider]    Allergies Amoxicillin; Gabapentin; Naproxen; and Nsaids  Family History  Problem Relation Age of Onset  . Depression Sister   . Migraines Sister   . Hypertension Mother   . Arthritis/Rheumatoid Mother   . Heart attack Father   . Hypertension Father   . CAD Unknown   . Hypertension Unknown   . Diabetes Mellitus II Unknown   . Arthritis Unknown     Social History Social History   Tobacco Use  . Smoking status: Never Smoker  . Smokeless tobacco: Never Used  Substance Use Topics  . Alcohol use: No    Alcohol/week: 0.0 oz  . Drug use: No    Review of Systems Constitutional: No fever/chills Eyes: No visual changes. ENT: No sore throat. Cardiovascular: Denies chest  pain. Respiratory: Denies shortness of breath. Gastrointestinal: No abdominal pain.  No nausea, no vomiting.  No diarrhea.   Genitourinary: Negative for dysuria. Musculoskeletal: Positive for right arm pain. Skin: Negative for rash. Neurological: Negative for headaches, focal weakness or numbness.  ____________________________________________   PHYSICAL EXAM:  VITAL SIGNS: ED Triage Vitals [03/13/18 1942]  Enc Vitals Group     BP      Pulse      Resp  Temp      Temp src      SpO2      Weight 139 lb (63 kg)     Height 4\' 8"  (1.422 m)     Head Circumference      Peak Flow      Pain Score 10     Pain Loc      Pain Edu?      Excl. in [place]?      Constitutional: Alert and oriented.  Eyes: Conjunctivae are normal.  ENT      Head: Normocephalic and atraumatic.      Nose: No congestion/rhinnorhea.      Mouth/Throat: Mucous membranes are moist.      Neck: No stridor. Hematological/Lymphatic/Immunilogical: No cervical lymphadenopathy. Cardiovascular: Normal rate, regular rhythm.  No murmurs, rubs, or gallops.  Respiratory: Normal respiratory effort without tachypnea nor retractions. Breath sounds are clear and equal bilaterally. No wheezes/rales/rhonchi. Gastrointestinal: Soft and non tender. No rebound. No guarding.  Genitourinary: Deferred Musculoskeletal: Edema to the right shoulder, humerus, tender to palpation and manipulation.  Neurologic:  Normal speech and language. No gross focal neurologic deficits are appreciated.  Skin:  Skin is warm, dry and intact. No rash noted. Psychiatric: Mood and affect are normal. Speech and behavior are normal. Patient exhibits appropriate insight and judgment.  ____________________________________________    LABS (pertinent positives/negatives)  None  ____________________________________________   EKG  None  ____________________________________________    RADIOLOGY  CT head No acute findings  Right humerus Proximal  humerus fracture  I, Kimila Papaleo, personally viewed and evaluated these images (plain radiographs) as part of my medical decision making. ____________________________________________   PROCEDURES  Procedures  ____________________________________________   INITIAL IMPRESSION / ASSESSMENT AND PLAN / ED COURSE  Pertinent labs & imaging results that were available during my care of the patient were reviewed by me and considered in my medical decision making (see chart for details).   Patient from living facility because of concerns for right proximal humerus fracture.  Humerus x-rays who does show a right proximal humerus fracture.  Head CT was negative.  Will discharge with orthopedic follow-up, pain medication and sling.   ____________________________________________   FINAL CLINICAL IMPRESSION(S) / ED DIAGNOSES  Final diagnoses:  Closed fracture of proximal end of right humerus, unspecified fracture morphology, initial encounter     Note: This dictation was prepared with Dragon dictation. Any transcriptional errors that result from this process are unintentional     Nance Pear, MD 03/13/18 2101

## 2018-03-13 NOTE — ED Notes (Signed)
Pt up to toilet to void with minimal assistance

## 2018-03-13 NOTE — ED Notes (Signed)
Pt's heart rate noted to jump to 146 and then immediately back to 76; pt could be seen through doorway and was laying still in bed; in to check on pt, denies any change in symptoms; would like pain medication for her right arm/shoulder pain;

## 2018-03-13 NOTE — ED Notes (Signed)
Correction to triage note:  Patient resides at [facility].

## 2018-03-13 NOTE — ED Triage Notes (Signed)
Pt arrives via ems from the [facility], pt is axox4. Pt states that this am her feet got caught in the sheets calling her to fall out of the bed. Pt is c/o rt shoulder and upper arm pain. Pt had an x-ray at the [facility] and was told that she had a fx'd proximal humerus. Pt was given a percocet at 1400

## 2018-03-13 NOTE — ED Notes (Addendum)
Patient transported to X-ray 

## 2018-03-15 IMAGING — US US CAROTID DUPLEX BILAT
1 series · 13 of 24 positions shown · non-contrast
Comparison: CT 03/02/2017 .

CLINICAL DATA: Syncope.

EXAM:
BILATERAL CAROTID DUPLEX ULTRASOUND
TECHNIQUE: Gray scale imaging, color Doppler and duplex ultrasound were
performed of bilateral carotid and vertebral arteries in the neck.

[Series 1: us carotid duplex bilat · 0.07mm/px · 13 of 68 slices shown]
[im 1/68]
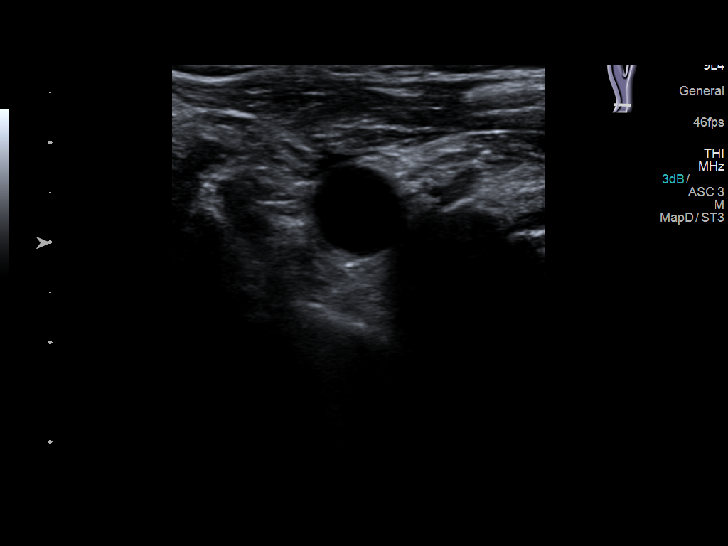
[im 6/68]
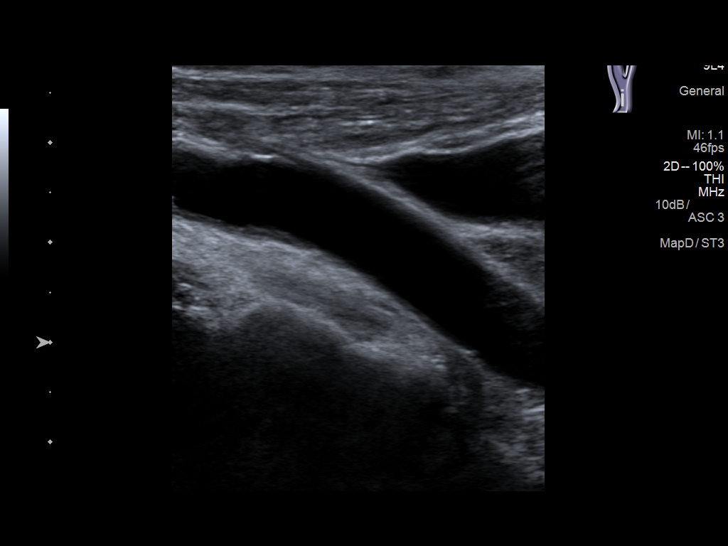
[im 12/68]
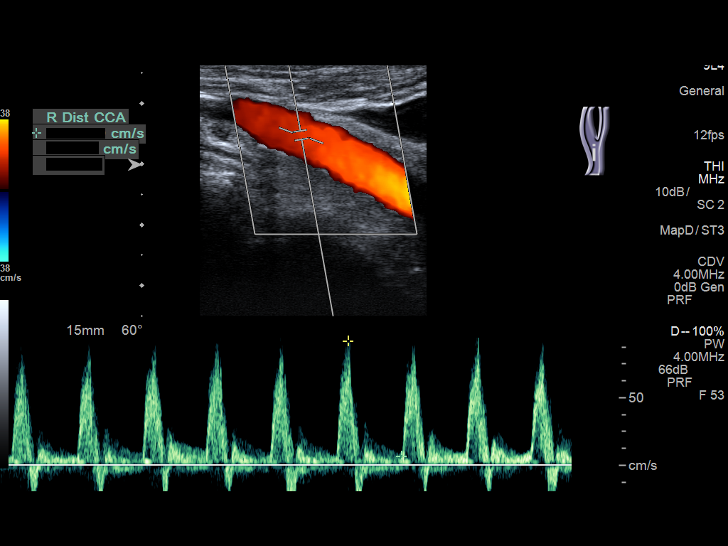
[im 18/68]
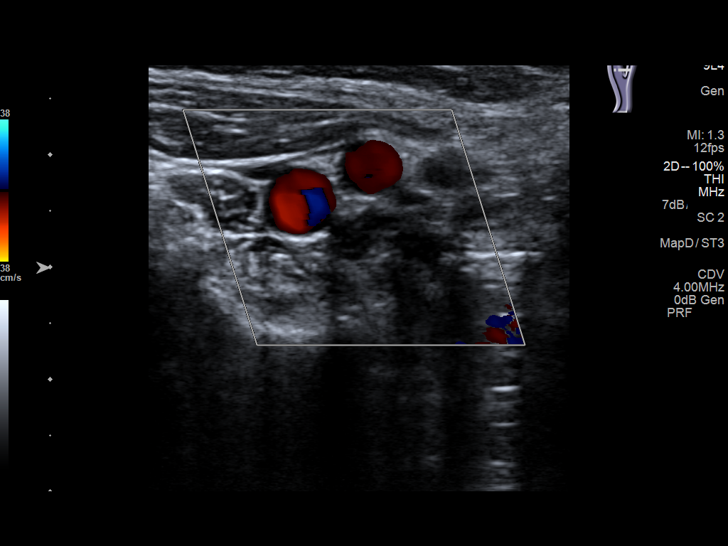
[im 24/68]
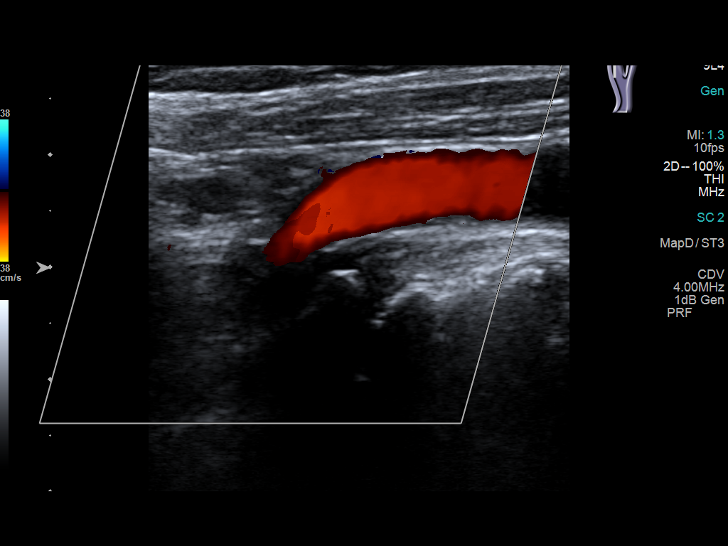
[im 30/68]
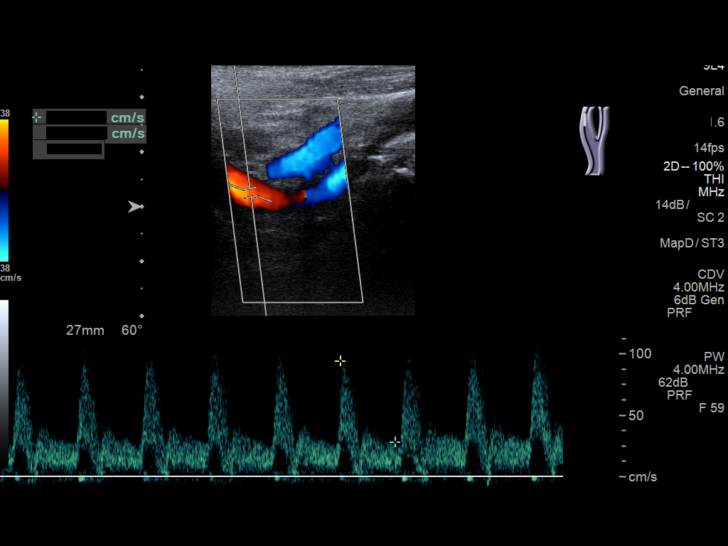
[im 35/68]
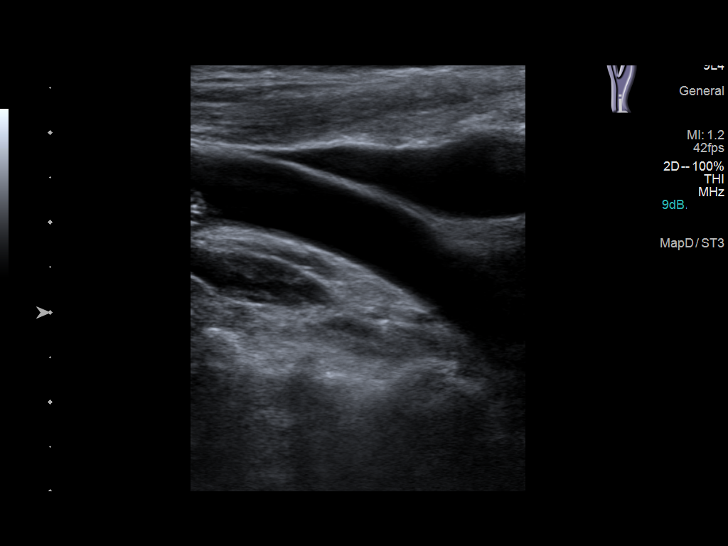
[im 38/68]
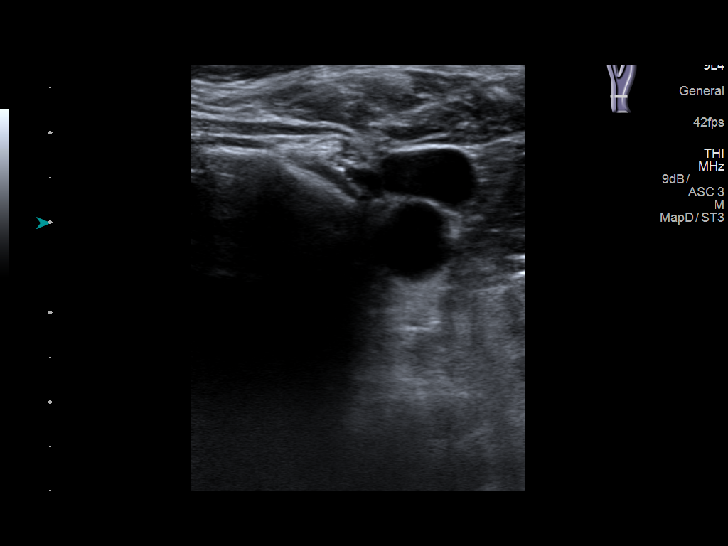
[im 44/68]
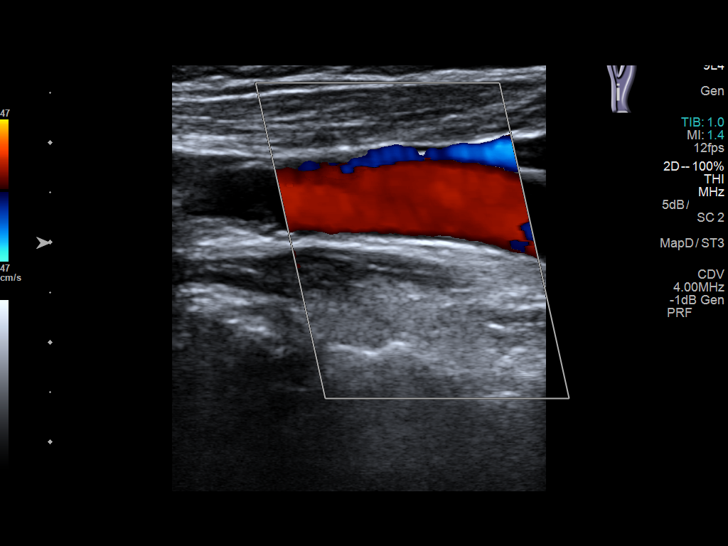
[im 50/68]
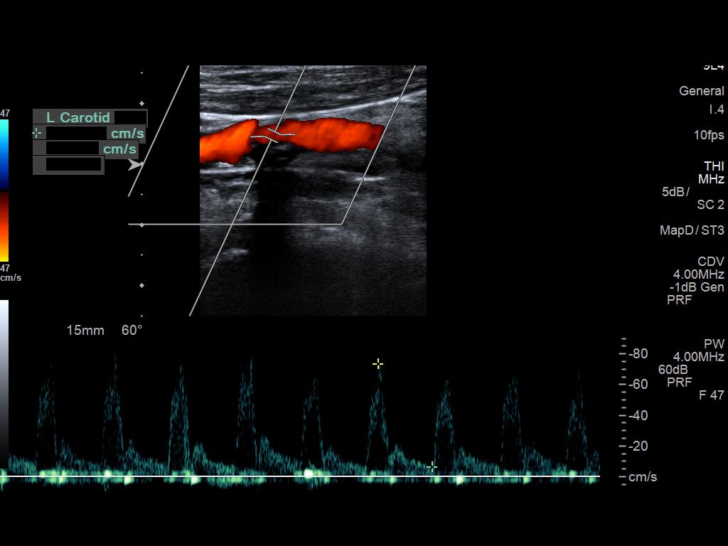
[im 56/68]
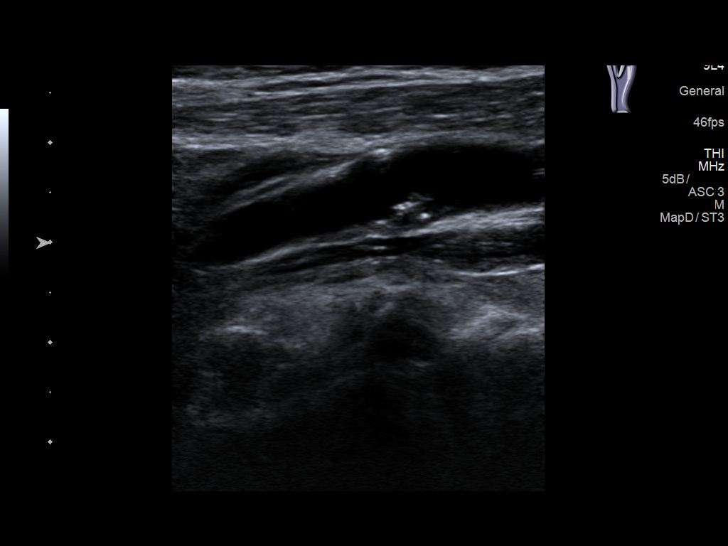
[im 62/68]
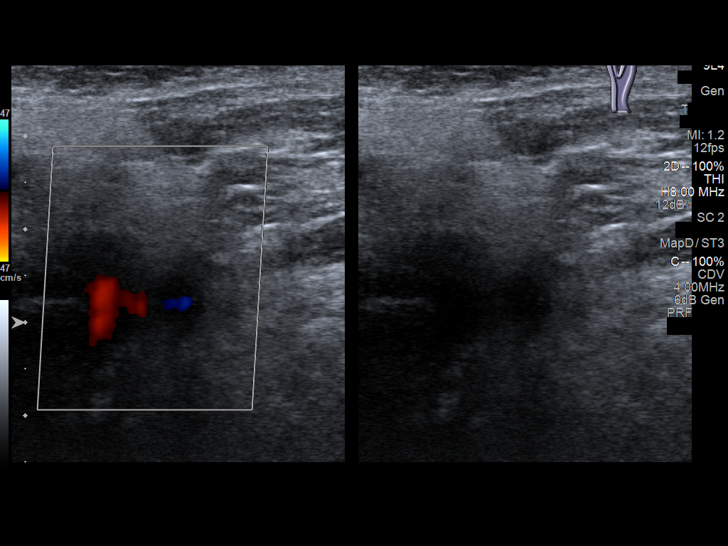
[im 68/68]
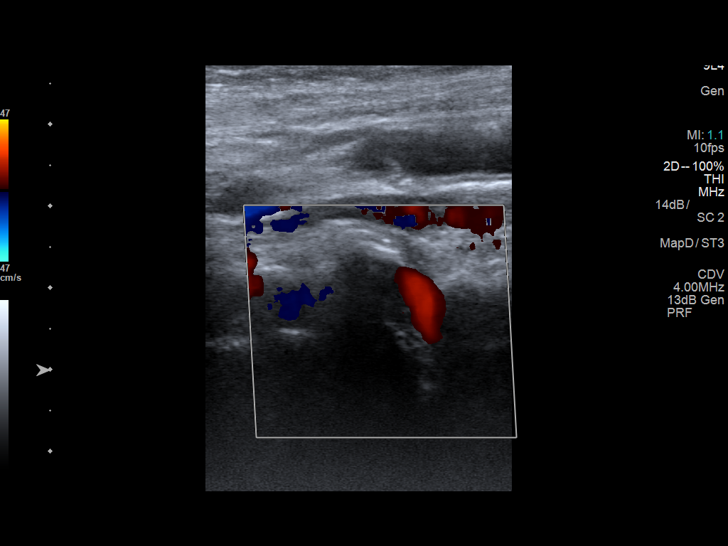

[13 of 24 positions shown; findings below may reference images not displayed]

FINDINGS: Criteria: Quantification of carotid stenosis is based on velocity
parameters that correlate the residual internal carotid diameter
with NASCET-based stenosis levels, using the diameter of the distal
internal carotid lumen as the denominator for stenosis measurement.

The following velocity measurements were obtained:

RIGHT

ICA:  135/33 cm/sec

CCA:  110/13 cm/sec

SYSTOLIC ICA/CCA RATIO:

DIASTOLIC ICA/CCA RATIO:

ECA:  145 cm/sec

LEFT

ICA:  89/16 cm/sec

CCA:  96/10 cm/sec

SYSTOLIC ICA/CCA RATIO:

DIASTOLIC ICA/CCA RATIO:

ECA:  102 cm/sec

RIGHT CAROTID ARTERY: Mild right carotid bifurcation atherosclerotic
vascular plaque. No flow limiting stenosis.

RIGHT VERTEBRAL ARTERY:  Patent with antegrade flow.

LEFT CAROTID ARTERY: Mild left carotid bifurcation atherosclerotic
vascular plaque. No flow limiting stenosis.

LEFT VERTEBRAL ARTERY:  Patent with antegrade flow.
IMPRESSION: 1. Mild bilateral carotid atherosclerotic vascular plaque. No flow
limiting stenosis. Degree of stenosis less than 50% bilaterally.

2. Vertebrals are patent antegrade flow.

## 2018-04-09 ENCOUNTER — Other Ambulatory Visit: Payer: Self-pay

## 2018-04-09 ENCOUNTER — Emergency Department: Payer: Medicare Other

## 2018-04-09 ENCOUNTER — Emergency Department
Admission: EM | Admit: 2018-04-09 | Discharge: 2018-04-10 | Disposition: A | Discharge status: Skilled Nursing Facility | Payer: Medicare Other | Attending: Emergency Medicine | Admitting: Emergency Medicine

## 2018-04-09 DIAGNOSIS — E119 Type 2 diabetes mellitus without complications: Secondary | ICD-10-CM | POA: Diagnosis not present

## 2018-04-09 DIAGNOSIS — Y92122 Bedroom in nursing home as the place of occurrence of the external cause: Secondary | ICD-10-CM | POA: Insufficient documentation

## 2018-04-09 DIAGNOSIS — Z95818 Presence of other cardiac implants and grafts: Secondary | ICD-10-CM | POA: Diagnosis not present

## 2018-04-09 DIAGNOSIS — S0181XA Laceration without foreign body of other part of head, initial encounter: Secondary | ICD-10-CM | POA: Insufficient documentation

## 2018-04-09 DIAGNOSIS — Z96692 Finger-joint replacement of left hand: Secondary | ICD-10-CM | POA: Diagnosis not present

## 2018-04-09 DIAGNOSIS — J45909 Unspecified asthma, uncomplicated: Secondary | ICD-10-CM | POA: Insufficient documentation

## 2018-04-09 DIAGNOSIS — I5022 Chronic systolic (congestive) heart failure: Secondary | ICD-10-CM | POA: Insufficient documentation

## 2018-04-09 DIAGNOSIS — S0993XA Unspecified injury of face, initial encounter: Secondary | ICD-10-CM | POA: Diagnosis present

## 2018-04-09 DIAGNOSIS — S0990XA Unspecified injury of head, initial encounter: Secondary | ICD-10-CM

## 2018-04-09 DIAGNOSIS — Z79899 Other long term (current) drug therapy: Secondary | ICD-10-CM | POA: Insufficient documentation

## 2018-04-09 DIAGNOSIS — S80211A Abrasion, right knee, initial encounter: Secondary | ICD-10-CM | POA: Insufficient documentation

## 2018-04-09 DIAGNOSIS — I110 Hypertensive heart disease with heart failure: Secondary | ICD-10-CM | POA: Insufficient documentation

## 2018-04-09 DIAGNOSIS — Y9389 Activity, other specified: Secondary | ICD-10-CM | POA: Insufficient documentation

## 2018-04-09 DIAGNOSIS — R296 Repeated falls: Secondary | ICD-10-CM | POA: Insufficient documentation

## 2018-04-09 DIAGNOSIS — Z96698 Presence of other orthopedic joint implants: Secondary | ICD-10-CM | POA: Insufficient documentation

## 2018-04-09 DIAGNOSIS — Z96653 Presence of artificial knee joint, bilateral: Secondary | ICD-10-CM | POA: Diagnosis not present

## 2018-04-09 DIAGNOSIS — I2510 Atherosclerotic heart disease of native coronary artery without angina pectoris: Secondary | ICD-10-CM | POA: Insufficient documentation

## 2018-04-09 DIAGNOSIS — Y999 Unspecified external cause status: Secondary | ICD-10-CM | POA: Diagnosis not present

## 2018-04-09 DIAGNOSIS — W0110XA Fall on same level from slipping, tripping and stumbling with subsequent striking against unspecified object, initial encounter: Secondary | ICD-10-CM | POA: Diagnosis not present

## 2018-04-09 DIAGNOSIS — W19XXXA Unspecified fall, initial encounter: Secondary | ICD-10-CM

## 2018-04-09 MED ORDER — ACETAMINOPHEN 325 MG PO TABS
650.0000 mg | ORAL_TABLET | Freq: Once | ORAL | Status: AC
  Administered 2018-04-10: 650 mg via ORAL
  Filled 2018-04-09: qty 2

## 2018-04-09 NOTE — ED Triage Notes (Signed)
Patient here via EMS from [facility] for a fall. States her leg was asleep and when she stood up she fell over. Has skin tear to right forehead and right knee pain. Right shoulder is fractured from a previous fall. Patient is alert and oriented.

## 2018-04-09 NOTE — ED Provider Notes (Signed)
[facility] Emergency Department Provider Note   ____________________________________________   First MD Initiated Contact with Patient 04/09/18 2338     (approximate)  I have reviewed the triage vital signs and the nursing notes.   HISTORY  Chief Complaint Fall    HPI Meghan [name] is a 72 y.o. female to the ED from [facility] with a chief complaint of fall.  Patient was getting out of bed and did not realize her leg was asleep.  When she stood up, she fell over, striking her right forehead.  Denies LOC.  Presents with pain and laceration to her right forehead and abrasion to her right anterior knee.  Patient had a fall earlier this month with right humerus fracture; arrives in shoulder immobilizer.  Denies recent fever, chills, vision changes, neck pain, chest pain, shortness of breath, abdominal pain, nausea, vomiting or dysuria.  Denies taking anticoagulants.   Past Medical History:  Diagnosis Date  . Anginal pain ([place])   . Anxiety   . Arthritis    RA  . Asthma   . Brain tumor (benign) ([place])   . CHF (congestive heart failure) ([place])   . Collagen vascular disease ([place])   . COPD (chronic obstructive pulmonary disease) ([place])   . Coronary artery disease   . DDD (degenerative disc disease)   . Depression   . Diabetes mellitus   . GERD (gastroesophageal reflux disease)   . Headache   . Heart murmur   . Heart murmur   . Hypercholesteremia   . Hypertension   . Lumbar degenerative disc disease   . Migraines   . Obesity   . Osteopenia   . Osteoporosis   . Pneumonia   . PTSD (post-traumatic stress disorder)     Patient Active Problem List   Diagnosis Date Noted  . Syncope 05/05/2017  . Acute on chronic systolic CHF (congestive heart failure) ([place]) 04/16/2017  . Acute lower UTI 04/16/2017  . Acute respiratory failure ([place]) 04/16/2017  . Pressure injury of skin 03/22/2017  . HCAP (healthcare-associated pneumonia) 03/20/2017  . Acute  on chronic respiratory failure with hypoxia ([place]) 03/20/2017  . Acute respiratory failure with hypoxia ([place]) 03/20/2017  . Near syncope 02/27/2017  . Dehydration 02/27/2017  . Colitis 02/22/2017  . Seizure ([place]) 01/03/2016  . Chronic tension-type headache, intractable 12/04/2015  . 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 Degeneration of intervertebral disc of lumbar region 07/15/2015  . Seropositive rheumatoid arthritis ([place]) 07/15/2015  . Asthma with acute exacerbation 03/10/2015  . Hypokalemia 03/01/2015  . Hyponatremia 03/01/2015  . DDD (degenerative disc disease), lumbar 01/31/2015  . Arthritis, degenerative 01/31/2015  . Rheumatoid arthritis with rheumatoid factor ([place]) 01/31/2015  . HTN (hypertension) 12/24/2014  . Sepsis ([place]) 12/24/2014  . Left knee pain 12/24/2014  . GERD (gastroesophageal reflux disease) 12/24/2014  . COPD (chronic obstructive pulmonary disease) ([place]) 12/24/2014  . Depression 12/24/2014  . Anxiety 12/24/2014  . Severe bipolar disorder with psychotic features, mood-congruent ([place]) 11/16/2014  . Neurosis, posttraumatic 11/16/2014  . H/O gastric ulcer 11/16/2014  . Barton's fracture of distal radius, closed 09/19/2014  . Neuritis or radiculitis due to rupture of lumbar intervertebral disc 05/11/2014  . Cervico-occipital neuralgia 03/26/2014  . Difficulty in walking 03/26/2014  . Difficulty in walking, not elsewhere classified 03/26/2014  . Cervical spine syndrome 01/26/2014  . Cephalalgia 01/09/2014  . Disordered sleep 01/09/2014  . BP (high blood pressure) 10/19/2013  . Adiposity 10/19/2013  . Cardiac murmur 10/19/2013  . PNA (  pneumonia) 10/19/2013  . Breath shortness 10/19/2013  . Chronic obstructive pulmonary disease ([place]) 10/19/2013  . Diabetes mellitus ([place]) 10/19/2013    Past Surgical History:  Procedure Laterality Date  . ABDOMINAL HYSTERECTOMY    . CERVICAL FUSION    . CHOLECYSTECTOMY    . COLONOSCOPY WITH PROPOFOL N/A 09/20/2015    Procedure: COLONOSCOPY WITH PROPOFOL;  Surgeon: Josefine Class, MD;  Location[facility] END[facility]COPY;  Service: Endoscopy;  Laterality: N/A;  . COLONOSCOPY WITH PROPOFOL N/A 01/27/2016   Procedure: COLONOSCOPY WITH PROPOFOL;  Surgeon: Manya Silvas, MD;  Location: [facility] ENDOSCOPY;  Service: Endoscopy;  Laterality: N/A;  . FINGER ARTHROPLASTY  02/23/2012   Procedure: FINGER ARTHROPLASTY;  Surgeon: Cammie Sickle., MD;  Location: [place];  Service: Orthopedics;  Laterality: Left;  Extensor carpi radialis longus to Extensor carpi ulnaris transfer, left Metaphalangeal reconstructions of index and long fingers,  . FOOT ARTHROPLASTY     toes x2 rt foot  . HAND RECONSTRUCTION  2011   right-multiple finger joint reconst  . JOINT REPLACEMENT     bilat knee replacements  . KYPHOPLASTY N/A 06/14/2017   Procedure: KYPHOPLASTY L1;  Surgeon: Hessie Knows, MD;  Location: [facility] ORS;  Service: Orthopedics;  Laterality: N/A;  . LOOP RECORDER INSERTION N/A 06/23/2017   Procedure: LOOP RECORDER INSERTION;  Surgeon: Isaias Cowman, MD;  Location: [place] CV LAB;  Service: Cardiovascular;  Laterality: N/A;    Prior to Admission medications   Medication Sig Start Date End Date Taking? Authorizing Provider  busPIRone (BUSPAR) 7.5 MG tablet Take 1 tablet by mouth 2 (two) times daily.    [provider]  cefUROXime (CEFTIN) 500 MG tablet Take 1 tablet (500 mg total) by mouth 2 (two) times daily with a meal. 08/28/17   Bettey Costa, MD  Cholecalciferol 5000 units TABS Take 5,000 Units by mouth every 30 (thirty) days.    [provider]  clonazePAM (KLONOPIN) 0.5 MG tablet Take 0.125 mg by mouth 2 (two) times daily.    [provider]  cyanocobalamin 500 MCG tablet Take 500 mcg by mouth daily.    [provider]  diclofenac sodium (VOLTAREN) 1 % GEL Apply 2 g topically 3 (three) times daily. Apply to the fingers    [provider]  diclofenac  sodium (VOLTAREN) 1 % GEL Apply 4 g topically 4 (four) times daily. Apply to the left knee    [provider]  diphenoxylate-atropine (LOMOTIL) 2.5-0.025 MG tablet Take 1 tablet by mouth every 6 (six) hours as needed for diarrhea or loose stools.     [provider]  escitalopram (LEXAPRO) 10 MG tablet Take 1 tablet (10 mg total) by mouth every morning. 09/11/16   Rainey Pines, MD  fluticasone (VERAMYST) 27.5 MCG/SPRAY nasal spray Place 2 sprays into the nose 2 (two) times daily.    [provider]  folic acid (FOLVITE) 1 MG tablet Take 1 mg by mouth daily.    [provider]  furosemide (LASIX) 40 MG tablet Take 40 mg by mouth 2 (two) times daily.    [provider]  HYDROcodone-acetaminophen (NORCO/VICODIN) 5-325 MG tablet Take 1 tablet by mouth every 6 (six) hours as needed for severe pain. 03/13/18 03/13/19  Nance Pear, MD  Ipratropium-Albuterol (COMBIVENT RESPIMAT) 20-100 MCG/ACT AERS respimat Inhale 2 puffs into the lungs every 6 (six) hours as needed for wheezing.    [provider]  Ipratropium-Albuterol (COMBIVENT RESPIMAT) 20-100 MCG/ACT AERS respimat Inhale 2 puffs  into the lungs every 6 (six) hours.    [provider]  lamoTRIgine (LAMICTAL) 100 MG tablet Take 1 tablet (100 mg total) by mouth daily. 06/02/16   Rainey Pines, MD  lisinopril (PRINIVIL,ZESTRIL) 2.5 MG tablet Take 1 tablet by mouth 2 (two) times daily.    [provider]  loperamide (IMODIUM A-D) 2 MG tablet Take 1 tablet (2 mg total) by mouth 4 (four) times daily as needed for diarrhea or loose stools. Patient taking differently: Take 2 mg by mouth as needed for diarrhea or loose stools.  05/10/17   Eula Listen, MD  loratadine (CLARITIN) 10 MG tablet Take 10 mg by mouth daily.    [provider]  methotrexate (RHEUMATREX) 7.5 MG tablet Take 7.5 mg by mouth once a week. Caution" Chemotherapy. Protect from light.    [provider]    metoprolol tartrate (LOPRESSOR) 25 MG tablet Take 25 mg by mouth 2 (two) times daily.    [provider]  mirtazapine (REMERON) 7.5 MG tablet Take 7.5 mg by mouth at bedtime.     [provider]  montelukast (SINGULAIR) 10 MG tablet Take 10 mg by mouth daily.    [provider]  omeprazole (PRILOSEC) 20 MG capsule Take 20 mg by mouth daily.     [provider]  oxyCODONE-acetaminophen (PERCOCET/ROXICET) 5-325 MG tablet Take 1 tablet every 4 (four) hours as needed by mouth for moderate pain or severe pain. 06/15/17   Henreitta Leber, MD  Potassium Chloride ER 20 MEQ TBCR Take 20 mEq by mouth 2 (two) times daily. 05/07/17   Vaughan Basta, MD  pramipexole (MIRAPEX) 0.25 MG tablet Take 0.25 mg by mouth daily.     [provider]  predniSONE (DELTASONE) 5 MG tablet Take 5 mg by mouth daily with breakfast.    [provider]  pregabalin (LYRICA) 50 MG capsule TAKE ONE CAPSULE BY MOUTH TWICE A DAY 06/18/15   [provider]  promethazine (PHENERGAN) 25 MG suppository Place 1 suppository (25 mg total) rectally every 6 (six) hours as needed for nausea. 05/10/17 05/10/18  Eula Listen, MD  saxagliptin HCl (ONGLYZA) 5 MG TABS tablet Take 5 mg by mouth at bedtime.    [provider]  Tofacitinib Citrate 5 MG TABS Take 5 mg by mouth 2 (two) times daily after a meal.     [provider]  topiramate (TOPAMAX) 25 MG tablet Take 25 mg by mouth at bedtime.     [provider]    Allergies Amoxicillin; Gabapentin; Naproxen; and Nsaids  Family History  Problem Relation Age of Onset  . Depression Sister   . Migraines Sister   . Hypertension Mother   . Arthritis/Rheumatoid Mother   . Heart attack Father   . Hypertension Father   . CAD Unknown   . Hypertension Unknown   . Diabetes Mellitus II Unknown   . Arthritis Unknown     Social History Social History   Tobacco Use  . Smoking status: Never  Smoker  . Smokeless tobacco: Never Used  Substance Use Topics  . Alcohol use: No    Alcohol/week: 0.0 standard drinks  . Drug use: No    Review of Systems  Constitutional: Positive for fall.  No fever/chills Eyes: No visual changes. ENT: Positive for facial injuries.  No sore throat. Cardiovascular: Denies chest pain. Respiratory: Denies shortness of breath. Gastrointestinal: No abdominal pain.  No nausea, no vomiting.  No diarrhea.  No constipation. Genitourinary: Negative  for dysuria. Musculoskeletal: Negative for back pain. Skin: Negative for rash. Neurological: Negative for headaches, focal weakness or numbness.   ____________________________________________   PHYSICAL EXAM:  VITAL SIGNS: ED Triage Vitals  Enc Vitals Group     BP      Pulse      Resp      Temp      Temp src      SpO2      Weight      Height      Head Circumference      Peak Flow      Pain Score      Pain Loc      Pain Edu?      Excl. in [place]?     Constitutional: Alert and oriented. Well appearing and in no acute distress. Eyes: Conjunctivae are normal. PERRL. EOMI. Head: Laceration noted above right eyebrow with matted blood; will reexamine after wound is cleaned; Abrasion to right cheek. Nose: No deformity or injury. Mouth/Throat: Mucous membranes are moist.  Poor dentition with missing teeth.  No dental malocclusion. Neck: No stridor.  No cervical spine tenderness to palpation.  No step-offs or deformities noted. Cardiovascular: Normal rate, regular rhythm. Grossly normal heart sounds.  Good peripheral circulation. Respiratory: Normal respiratory effort.  No retractions. Lungs CTAB. Gastrointestinal: Soft and nontender. No distention. No abdominal bruits. No CVA tenderness. Musculoskeletal: Right upper arm in immobilizer.  Small abrasion noted to anterior right knee.  Full range of motion without pain.  No lower extremity tenderness nor edema.  No joint effusions. Neurologic: Alert and  oriented to person and place.  CN II-XII grossly intact.  Normal speech and language. No gross focal neurologic deficits are appreciated.  Skin:  Skin is warm, dry and intact. No rash noted. Psychiatric: Mood and affect are normal. Speech and behavior are normal.  ____________________________________________   LABS (all labs ordered are listed, but only abnormal results are displayed)  Labs Reviewed - No data to display ____________________________________________  EKG  None ____________________________________________  RADIOLOGY  ED MD interpretation: No acute ICH, no facial fractures  Official radiology report(s): Ct Head Wo Contrast  Result Date: 04/10/2018 CLINICAL DATA:  Patient status post fall. EXAM: CT HEAD WITHOUT CONTRAST CT MAXILLOFACIAL WITHOUT CONTRAST TECHNIQUE: Multidetector CT imaging of the head and maxillofacial structures were performed using the standard protocol without intravenous contrast. Multiplanar CT image reconstructions of the maxillofacial structures were also generated. COMPARISON:  Brain CT 03/13/2018 FINDINGS: CT HEAD FINDINGS Brain: Re- demonstrated right cerebellar encephalomalacia status post suboccipital craniotomy. Ventricles and sulci are prominent compatible with atrophy. Periventricular and subcortical white matter hypodensity compatible with chronic microvascular ischemic changes. No evidence for acute cortically based infarct, intracranial hemorrhage, mass lesion or mass-effect. Vascular: Unremarkable. Skull: Suboccipital craniectomy. Fluid-filled left mastoid air cells. Paranasal sinuses unremarkable. Other: None. CT MAXILLOFACIAL FINDINGS Osseous: No fracture or mandibular dislocation. No destructive process. Orbits: Negative. No traumatic or inflammatory finding. Sinuses: Negative Soft tissues: Right periorbital soft tissue swelling. Soft tissue hematoma/swelling overlying the right mandible. IMPRESSION: No acute intracranial process. No acute  maxillofacial fracture. Right periorbital soft tissue swelling. Soft tissue swelling/hematoma overlying the right mandible. Electronically Signed   By: Lovey Newcomer M.D.   On: 04/10/2018 00:31   Ct Maxillofacial Wo Cm  Result Date: 04/10/2018 CLINICAL DATA:  Patient status post fall. EXAM: CT HEAD WITHOUT CONTRAST CT MAXILLOFACIAL WITHOUT CONTRAST TECHNIQUE: Multidetector CT imaging of the head and maxillofacial structures were performed using the standard protocol without intravenous  contrast. Multiplanar CT image reconstructions of the maxillofacial structures were also generated. COMPARISON:  Brain CT 03/13/2018 FINDINGS: CT HEAD FINDINGS Brain: Re- demonstrated right cerebellar encephalomalacia status post suboccipital craniotomy. Ventricles and sulci are prominent compatible with atrophy. Periventricular and subcortical white matter hypodensity compatible with chronic microvascular ischemic changes. No evidence for acute cortically based infarct, intracranial hemorrhage, mass lesion or mass-effect. Vascular: Unremarkable. Skull: Suboccipital craniectomy. Fluid-filled left mastoid air cells. Paranasal sinuses unremarkable. Other: None. CT MAXILLOFACIAL FINDINGS Osseous: No fracture or mandibular dislocation. No destructive process. Orbits: Negative. No traumatic or inflammatory finding. Sinuses: Negative Soft tissues: Right periorbital soft tissue swelling. Soft tissue hematoma/swelling overlying the right mandible. IMPRESSION: No acute intracranial process. No acute maxillofacial fracture. Right periorbital soft tissue swelling. Soft tissue swelling/hematoma overlying the right mandible. Electronically Signed   By: Lovey Newcomer M.D.   On: 04/10/2018 00:31    ____________________________________________   PROCEDURES  Procedure(s) performed:     Marland KitchenMarland KitchenLaceration Repair Date/Time: 04/10/2018 1:51 AM Performed by: Paulette Blanch, MD Authorized by: Paulette Blanch, MD   Consent:    Consent obtained:   Verbal   Consent given by:  Patient   Risks discussed:  Infection, pain, poor cosmetic result and poor wound healing Anesthesia (see MAR for exact dosages):    Anesthesia method:  None Laceration details:    Location:  Face   Face location:  R eyebrow   Length (cm):  4   Depth (mm):  1 Repair type:    Repair type:  Simple Exploration:    Hemostasis achieved with:  Direct pressure   Wound exploration: entire depth of wound probed and visualized     Contaminated: no   Treatment:    Area cleansed with:  Saline   Amount of cleaning:  Standard   Irrigation solution:  Sterile saline   Visualized foreign bodies/material removed: no   Skin repair:    Repair method:  Steri-Strips and tissue adhesive Approximation:    Approximation:  Close Post-procedure details:    Patient tolerance of procedure:  Tolerated well, no immediate complications    Critical Care performed: No  ____________________________________________   INITIAL IMPRESSION / ASSESSMENT AND PLAN / ED COURSE  As part of my medical decision making, I reviewed the following data within the [place] notes reviewed and incorporated, Old chart reviewed, Radiograph reviewed and Notes from prior ED visits   72 year old female who presents status post mechanical fall with right facial injury.  Tetanus is up-to-date.  Will obtain CT image studies of head and maxillofacial.  Tylenol administered for pain.  Clinical Course as of Apr 10 149  Sun Apr 10, 2018  0100 Updated patient of CT imaging results.  Will clean of her wounds and repair laceration.  Patient requesting Steri-Strip repair if possible.   [JS]  0148 Patient cleaned up.  She has an approximately 4 cm curved, well approximated laceration above her left eyebrow.  The patient is still requesting Steri-Strip repair.  Dermabond was and Steri-Strips applied.  Strict return precautions given.  Patient verbalizes understanding agrees with plan of  care.   [JS]    Clinical Course User Index [JS] Paulette Blanch, MD     ____________________________________________   FINAL CLINICAL IMPRESSION(S) / ED DIAGNOSES  Final diagnoses:  Fall, initial encounter  Facial laceration, initial encounter  Minor head injury, initial encounter     ED Discharge Orders    None       Note:  This  document was prepared using Systems analyst and may include unintentional dictation errors.    Paulette Blanch, MD 04/10/18 [phone]

## 2018-04-10 ENCOUNTER — Other Ambulatory Visit: Payer: Self-pay

## 2018-04-10 ENCOUNTER — Emergency Department: Payer: Medicare Other

## 2018-04-10 ENCOUNTER — Emergency Department
Admission: EM | Admit: 2018-04-10 | Discharge: 2018-04-10 | Disposition: A | Discharge status: Home / Self Care | Payer: Medicare Other | Source: Home / Self Care | Attending: Emergency Medicine | Admitting: Emergency Medicine

## 2018-04-10 DIAGNOSIS — J449 Chronic obstructive pulmonary disease, unspecified: Secondary | ICD-10-CM

## 2018-04-10 DIAGNOSIS — I110 Hypertensive heart disease with heart failure: Secondary | ICD-10-CM | POA: Insufficient documentation

## 2018-04-10 DIAGNOSIS — I2510 Atherosclerotic heart disease of native coronary artery without angina pectoris: Secondary | ICD-10-CM | POA: Insufficient documentation

## 2018-04-10 DIAGNOSIS — S0181XA Laceration without foreign body of other part of head, initial encounter: Secondary | ICD-10-CM | POA: Diagnosis not present

## 2018-04-10 DIAGNOSIS — S82001D Unspecified fracture of right patella, subsequent encounter for closed fracture with routine healing: Secondary | ICD-10-CM | POA: Insufficient documentation

## 2018-04-10 DIAGNOSIS — E119 Type 2 diabetes mellitus without complications: Secondary | ICD-10-CM

## 2018-04-10 DIAGNOSIS — W0110XA Fall on same level from slipping, tripping and stumbling with subsequent striking against unspecified object, initial encounter: Secondary | ICD-10-CM | POA: Insufficient documentation

## 2018-04-10 DIAGNOSIS — F329 Major depressive disorder, single episode, unspecified: Secondary | ICD-10-CM | POA: Insufficient documentation

## 2018-04-10 DIAGNOSIS — Z9049 Acquired absence of other specified parts of digestive tract: Secondary | ICD-10-CM

## 2018-04-10 DIAGNOSIS — I5020 Unspecified systolic (congestive) heart failure: Secondary | ICD-10-CM

## 2018-04-10 DIAGNOSIS — Z96653 Presence of artificial knee joint, bilateral: Secondary | ICD-10-CM

## 2018-04-10 DIAGNOSIS — F419 Anxiety disorder, unspecified: Secondary | ICD-10-CM | POA: Insufficient documentation

## 2018-04-10 DIAGNOSIS — Z79899 Other long term (current) drug therapy: Secondary | ICD-10-CM

## 2018-04-10 DIAGNOSIS — Z7984 Long term (current) use of oral hypoglycemic drugs: Secondary | ICD-10-CM

## 2018-04-10 DIAGNOSIS — M25561 Pain in right knee: Secondary | ICD-10-CM

## 2018-04-10 MED ORDER — BACITRACIN-NEOMYCIN-POLYMYXIN 400-5-5000 EX OINT
TOPICAL_OINTMENT | Freq: Once | CUTANEOUS | Status: AC
  Administered 2018-04-10: 1 via TOPICAL
  Filled 2018-04-10: qty 1

## 2018-04-10 MED ORDER — ASPIRIN 300 MG RE SUPP: 300.0000 mg | Freq: Once | RECTAL | Status: DC

## 2018-04-10 NOTE — ED Notes (Signed)
Pt departed with [facility] to facility in stable condition, denies further c/o or questions.

## 2018-04-10 NOTE — Discharge Instructions (Addendum)
You may remove tape after 7 to 10 days if they have not fallen off. Return to the ER for worsening symptoms, persistent vomiting, difficulty breathing, lethargy or other concerns.

## 2018-04-10 NOTE — ED Provider Notes (Signed)
[facility] Emergency Department Provider Note  ____________________________________________  Time seen: Approximately 5:49 PM  I have reviewed the triage vital signs and the nursing notes.   HISTORY  Chief Complaint Knee Pain    HPI Meghan [name] is a 72 y.o. female with a history of CHF, COPD, hypertension who was sent to the ED today for evaluation of a right patellar fracture.  Patient had a fall yesterday.  Was seen in the ED last night where a laceration to her head was repaired, CT scans of the head and face were unremarkable.  This morning the patient was complaining of worsening knee pain and so her healthcare facility obtained a x-ray of the right knee which showed a patella fracture.  Unable to view there images.  They send the patient back to the ED for management of this.  With a also expressed concern to the patient that her forehead laceration needed to be sutured.  Reviewed Dr. Asher Muir notes which commented that the wound was not gaping, and that the patient had directed Dr. Beather Arbour to repair it with Steri-Strips.   Knee pain is constant, worse with movement, no alleviating factors.  Mild to moderate intensity and aching.  Nonradiating.   Past Medical History:  Diagnosis Date  . Anginal pain ([place])   . Anxiety   . Arthritis    RA  . Asthma   . Brain tumor (benign) ([place])   . CHF (congestive heart failure) ([place])   . Collagen vascular disease ([address])   . COPD (chronic obstructive pulmonary disease) ([place])   . Coronary artery disease   . DDD (degenerative disc disease)   . Depression   . Diabetes mellitus   . GERD (gastroesophageal reflux disease)   . Headache   . Heart murmur   . Heart murmur   . Hypercholesteremia   . Hypertension   . Lumbar degenerative disc disease   . Migraines   . Obesity   . Osteopenia   . Osteoporosis   . Pneumonia   . PTSD (post-traumatic stress disorder)      Patient Active Problem List   Diagnosis Date  Noted  . Syncope 05/05/2017  . Acute on chronic systolic CHF (congestive heart failure) ([place]) 04/16/2017  . Acute lower UTI 04/16/2017  . Acute respiratory failure ([place]) 04/16/2017  . Pressure injury of skin 03/22/2017  . HCAP (healthcare-associated pneumonia) 03/20/2017  . Acute on chronic respiratory failure with hypoxia ([place]) 03/20/2017  . Acute respiratory failure with hypoxia ([place]) 03/20/2017  . Near syncope 02/27/2017  . Dehydration 02/27/2017  . Colitis 02/22/2017  . Seizure ([place]) 01/03/2016  . Chronic tension-type headache, intractable 12/04/2015  . 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 Degeneration of intervertebral disc of lumbar region 07/15/2015  . 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 Asthma with acute exacerbation 03/10/2015  . Hypokalemia 03/01/2015  . Hyponatremia 03/01/2015  . DDD (degenerative disc disease), lumbar 01/31/2015  . Arthritis, degenerative 01/31/2015  . Rheumatoid arthritis with rheumatoid factor ([place]) 01/31/2015  . HTN (hypertension) 12/24/2014  . Sepsis ([place]) 12/24/2014  . Left knee pain 12/24/2014  . GERD (gastroesophageal reflux disease) 12/24/2014  . COPD (chronic obstructive pulmonary disease) ([place]) 12/24/2014  . Depression 12/24/2014  . Anxiety 12/24/2014  . Severe bipolar disorder with psychotic features, mood-congruent ([place]) 11/16/2014  . Neurosis, posttraumatic 11/16/2014  . H/O gastric ulcer 11/16/2014  . Barton's fracture of distal radius, closed 09/19/2014  . Neuritis or radiculitis due to rupture of lumbar intervertebral disc 05/11/2014  .  Cervico-occipital neuralgia 03/26/2014  . Difficulty in walking 03/26/2014  . Difficulty in walking, not elsewhere classified 03/26/2014  . Cervical spine syndrome 01/26/2014  . Cephalalgia 01/09/2014  . Disordered sleep 01/09/2014  . BP (high blood pressure) 10/19/2013  . Adiposity 10/19/2013  . Cardiac murmur 10/19/2013  . PNA (pneumonia) 10/19/2013  . Breath shortness  10/19/2013  . Chronic obstructive pulmonary disease ([place]) 10/19/2013  . Diabetes mellitus ([place]) 10/19/2013     Past Surgical History:  Procedure Laterality Date  . ABDOMINAL HYSTERECTOMY    . CERVICAL FUSION    . CHOLECYSTECTOMY    . COLONOSCOPY WITH PROPOFOL N/A 09/20/2015   Procedure: COLONOSCOPY WITH PROPOFOL;  Surgeon: Josefine Class, MD;  Location[facility] ENDOSCOPY;  Service: Endoscopy;  Laterality: N/A;  . COLONOSCOPY WITH PROPOFOL N/A 01/27/2016   Procedure: COLONOSCOPY WITH PROPOFOL;  Surgeon: Manya Silvas, MD;  Location[facility] ENDOSCOPY;  Service: Endoscopy;  Laterality: N/A;  . FINGER ARTHROPLASTY  02/23/2012   Procedure: FINGER ARTHROPLASTY;  Surgeon: Cammie Sickle., MD;  Location: [facility];  Service: Orthopedics;  Laterality: Left;  Extensor carpi radialis longus to Extensor carpi ulnaris transfer, left Metaphalangeal reconstructions of index and long fingers,  . FOOT ARTHROPLASTY     toes x2 rt foot  . HAND RECONSTRUCTION  2011   right-multiple finger joint reconst  . JOINT REPLACEMENT     bilat knee replacements  . KYPHOPLASTY N/A 06/14/2017   Procedure: KYPHOPLASTY L1;  Surgeon: Hessie Knows, MD;  Location: [facility] ORS;  Service: Orthopedics;  Laterality: N/A;  . LOOP RECORDER INSERTION N/A 06/23/2017   Procedure: LOOP RECORDER INSERTION;  Surgeon: Isaias Cowman, MD;  Location: [facility] CV LAB;  Service: Cardiovascular;  Laterality: N/A;     Prior to Admission medications   Medication Sig Start Date End Date Taking? Authorizing Provider  busPIRone (BUSPAR) 7.5 MG tablet Take 1 tablet by mouth 2 (two) times daily.    [provider]  cefUROXime (CEFTIN) 500 MG tablet Take 1 tablet (500 mg total) by mouth 2 (two) times daily with a meal. 08/28/17   Bettey Costa, MD  Cholecalciferol 5000 units TABS Take 5,000 Units by mouth every 30 (thirty) days.    [provider]  clonazePAM (KLONOPIN) 0.5 MG tablet Take 0.125 mg by  mouth 2 (two) times daily.    [provider]  cyanocobalamin 500 MCG tablet Take 500 mcg by mouth daily.    [provider]  diclofenac sodium (VOLTAREN) 1 % GEL Apply 2 g topically 3 (three) times daily. Apply to the fingers    [provider]  diclofenac sodium (VOLTAREN) 1 % GEL Apply 4 g topically 4 (four) times daily. Apply to the left knee    [provider]  diphenoxylate-atropine (LOMOTIL) 2.5-0.025 MG tablet Take 1 tablet by mouth every 6 (six) hours as needed for diarrhea or loose stools.     [provider]  escitalopram (LEXAPRO) 10 MG tablet Take 1 tablet (10 mg total) by mouth every morning. 09/11/16   Rainey Pines, MD  fluticasone (VERAMYST) 27.5 MCG/SPRAY nasal spray Place 2 sprays into the nose 2 (two) times daily.    [provider]  folic acid (FOLVITE) 1 MG tablet Take 1 mg by mouth daily.    [provider]  furosemide (LASIX) 40 MG tablet Take 40 mg by mouth 2 (two) times daily.    [provider]  HYDROcodone-acetaminophen (NORCO/VICODIN) 5-325 MG tablet Take 1 tablet by mouth  every 6 (six) hours as needed for severe pain. 03/13/18 03/13/19  Nance Pear, MD  Ipratropium-Albuterol (COMBIVENT RESPIMAT) 20-100 MCG/ACT AERS respimat Inhale 2 puffs into the lungs every 6 (six) hours as needed for wheezing.    [provider]  Ipratropium-Albuterol (COMBIVENT RESPIMAT) 20-100 MCG/ACT AERS respimat Inhale 2 puffs into the lungs every 6 (six) hours.    [provider]  lamoTRIgine (LAMICTAL) 100 MG tablet Take 1 tablet (100 mg total) by mouth daily. 06/02/16   Rainey Pines, MD  lisinopril (PRINIVIL,ZESTRIL) 2.5 MG tablet Take 1 tablet by mouth 2 (two) times daily.    [provider]  loperamide (IMODIUM A-D) 2 MG tablet Take 1 tablet (2 mg total) by mouth 4 (four) times daily as needed for diarrhea or loose stools. Patient taking differently: Take 2 mg by mouth as needed for diarrhea or  loose stools.  05/10/17   Eula Listen, MD  loratadine (CLARITIN) 10 MG tablet Take 10 mg by mouth daily.    [provider]  methotrexate (RHEUMATREX) 7.5 MG tablet Take 7.5 mg by mouth once a week. Caution" Chemotherapy. Protect from light.    [provider]  metoprolol tartrate (LOPRESSOR) 25 MG tablet Take 25 mg by mouth 2 (two) times daily.    [provider]  mirtazapine (REMERON) 7.5 MG tablet Take 7.5 mg by mouth at bedtime.     [provider]  montelukast (SINGULAIR) 10 MG tablet Take 10 mg by mouth daily.    [provider]  omeprazole (PRILOSEC) 20 MG capsule Take 20 mg by mouth daily.     [provider]  oxyCODONE-acetaminophen (PERCOCET/ROXICET) 5-325 MG tablet Take 1 tablet every 4 (four) hours as needed by mouth for moderate pain or severe pain. 06/15/17   Henreitta Leber, MD  Potassium Chloride ER 20 MEQ TBCR Take 20 mEq by mouth 2 (two) times daily. 05/07/17   Vaughan Basta, MD  pramipexole (MIRAPEX) 0.25 MG tablet Take 0.25 mg by mouth daily.     [provider]  predniSONE (DELTASONE) 5 MG tablet Take 5 mg by mouth daily with breakfast.    [provider]  pregabalin (LYRICA) 50 MG capsule TAKE ONE CAPSULE BY MOUTH TWICE A DAY 06/18/15   [provider]  promethazine (PHENERGAN) 25 MG suppository Place 1 suppository (25 mg total) rectally every 6 (six) hours as needed for nausea. 05/10/17 05/10/18  Eula Listen, MD  saxagliptin HCl (ONGLYZA) 5 MG TABS tablet Take 5 mg by mouth at bedtime.    [provider]  Tofacitinib Citrate 5 MG TABS Take 5 mg by mouth 2 (two) times daily after a meal.     [provider]  topiramate (TOPAMAX) 25 MG tablet Take 25 mg by mouth at bedtime.     [provider]     Allergies Amoxicillin; Gabapentin; Naproxen; and Nsaids   Family History  Problem Relation Age of Onset  . Depression Sister   . Migraines Sister    . Hypertension Mother   . Arthritis/Rheumatoid Mother   . Heart attack Father   . Hypertension Father   . CAD Unknown   . Hypertension Unknown   . Diabetes Mellitus II Unknown   . Arthritis Unknown     Social History Social History   Tobacco Use  . Smoking status: Never Smoker  . Smokeless tobacco: Never Used  Substance Use Topics  . Alcohol use: No    Alcohol/week: 0.0 standard drinks  . Drug  use: No    Review of Systems  Constitutional:   No fever or chills.  ENT:   No sore throat. No rhinorrhea. Cardiovascular:   No chest pain or syncope. Respiratory:   No dyspnea or cough. Gastrointestinal:   Negative for abdominal pain, vomiting and diarrhea.  Musculoskeletal:   Right knee pain as above All other systems reviewed and are negative except as documented above in ROS and HPI.  ____________________________________________   PHYSICAL EXAM:  VITAL SIGNS: ED Triage Vitals  Enc Vitals Group     BP 04/10/18 1712 133/75     Pulse Rate 04/10/18 1712 70     Resp 04/10/18 1712 18     Temp 04/10/18 1712 98.8 F (37.1 C)     Temp Source 04/10/18 1712 Oral     SpO2 04/10/18 1712 97 %     Weight 04/10/18 1712 140 lb (63.5 kg)     Height 04/10/18 1712 4\' 8"  (1.422 m)     Head Circumference --      Peak Flow --      Pain Score 04/10/18 1734 10     Pain Loc --      Pain Edu? --      Excl. in [place]? --     Vital signs reviewed, nursing assessments reviewed.   Constitutional:   Alert and oriented. Non-toxic appearance. Eyes:   Conjunctivae are normal. EOMI. PERRL. ENT      Head:   Normocephalic with laceration of her right forehead that is been repaired.  Well approximated without bleeding.  Ecchymosis of the right maxilla..      Nose:   No congestion/rhinnorhea.  No epistaxis      Mouth/Throat:   MMM, no pharyngeal erythema. No peritonsillar mass.  No intraoral injury      Neck:   No meningismus. Full ROM.  No midline tenderness Hematological/Lymphatic/Immunilogical:    No cervical lymphadenopathy. Cardiovascular:   RRR. Symmetric bilateral radial and DP pulses.  No murmurs. Cap refill less than 2 seconds. Respiratory:   Normal respiratory effort without tachypnea/retractions. Breath sounds are clear and equal bilaterally. No wheezes/rales/rhonchi. Gastrointestinal:   Soft and nontender. Non distended. There is no CVA tenderness.  No rebound, rigidity, or guarding. Musculoskeletal:   Pain limits range of motion in the right knee.  Otherwise intact range of motion.  Moderate joint effusion in the right knee..  Tenderness over the right patella which is not dislocated.  No lower extremity edema. Neurologic:   Normal speech and language.  Motor grossly intact. No acute focal neurologic deficits are appreciated.  Skin:    Skin is warm, dry and intact. No rash noted.  No petechiae, purpura, or bullae.  ____________________________________________    LABS (pertinent positives/negatives) (all labs ordered are listed, but only abnormal results are displayed) Labs Reviewed - No data to display ____________________________________________   EKG    ____________________________________________    RADIOLOGY  Ct Head Wo Contrast  Result Date: 04/10/2018 CLINICAL DATA:  Patient status post fall. EXAM: CT HEAD WITHOUT CONTRAST CT MAXILLOFACIAL WITHOUT CONTRAST TECHNIQUE: Multidetector CT imaging of the head and maxillofacial structures were performed using the standard protocol without intravenous contrast. Multiplanar CT image reconstructions of the maxillofacial structures were also generated. COMPARISON:  Brain CT 03/13/2018 FINDINGS: CT HEAD FINDINGS Brain: Re- demonstrated right cerebellar encephalomalacia status post suboccipital craniotomy. Ventricles and sulci are prominent compatible with atrophy. Periventricular and subcortical white matter hypodensity compatible with chronic microvascular ischemic changes. No evidence for acute cortically  based infarct,  intracranial hemorrhage, mass lesion or mass-effect. Vascular: Unremarkable. Skull: Suboccipital craniectomy. Fluid-filled left mastoid air cells. Paranasal sinuses unremarkable. Other: None. CT MAXILLOFACIAL FINDINGS Osseous: No fracture or mandibular dislocation. No destructive process. Orbits: Negative. No traumatic or inflammatory finding. Sinuses: Negative Soft tissues: Right periorbital soft tissue swelling. Soft tissue hematoma/swelling overlying the right mandible. IMPRESSION: No acute intracranial process. No acute maxillofacial fracture. Right periorbital soft tissue swelling. Soft tissue swelling/hematoma overlying the right mandible. Electronically Signed   By: Lovey Newcomer M.D.   On: 04/10/2018 00:31   Dg Knee Complete 4 Views Right  Result Date: 04/10/2018 CLINICAL DATA:  Pain after a fall yesterday.  Subsequent encounter. EXAM: RIGHT KNEE - COMPLETE 4+ VIEW COMPARISON:  03/29/2014. FINDINGS: Prepatellar soft tissue swelling. Nondisplaced fracture involving the midportion of the patella. No other fractures. Prior total knee arthroplasty with anatomic alignment and no complicating features. Osseous demineralization large joint effusion/hemarthrosis. Femoropopliteal and tibioperoneal artery atherosclerosis. IMPRESSION: 1. Nondisplaced fracture involving the midportion of the patella. No other acute fractures. 2. RIGHT total knee arthroplasty with anatomic alignment and no complicating features. 3. Large joint effusion/hemarthrosis. Electronically Signed   By: Evangeline Dakin M.D.   On: 04/10/2018 18:28   Ct Maxillofacial Wo Cm  Result Date: 04/10/2018 CLINICAL DATA:  Patient status post fall. EXAM: CT HEAD WITHOUT CONTRAST CT MAXILLOFACIAL WITHOUT CONTRAST TECHNIQUE: Multidetector CT imaging of the head and maxillofacial structures were performed using the standard protocol without intravenous contrast. Multiplanar CT image reconstructions of the maxillofacial structures were also generated.  COMPARISON:  Brain CT 03/13/2018 FINDINGS: CT HEAD FINDINGS Brain: Re- demonstrated right cerebellar encephalomalacia status post suboccipital craniotomy. Ventricles and sulci are prominent compatible with atrophy. Periventricular and subcortical white matter hypodensity compatible with chronic microvascular ischemic changes. No evidence for acute cortically based infarct, intracranial hemorrhage, mass lesion or mass-effect. Vascular: Unremarkable. Skull: Suboccipital craniectomy. Fluid-filled left mastoid air cells. Paranasal sinuses unremarkable. Other: None. CT MAXILLOFACIAL FINDINGS Osseous: No fracture or mandibular dislocation. No destructive process. Orbits: Negative. No traumatic or inflammatory finding. Sinuses: Negative Soft tissues: Right periorbital soft tissue swelling. Soft tissue hematoma/swelling overlying the right mandible. IMPRESSION: No acute intracranial process. No acute maxillofacial fracture. Right periorbital soft tissue swelling. Soft tissue swelling/hematoma overlying the right mandible. Electronically Signed   By: Lovey Newcomer M.D.   On: 04/10/2018 00:31    ____________________________________________   PROCEDURES .Splint Application Date/Time: 01/12/3328 6:50 PM Performed by: Carrie Mew, MD Authorized by: Carrie Mew, MD   Consent:    Consent obtained:  Verbal   Consent given by:  Patient   Risks discussed:  Numbness and discoloration   Alternatives discussed:  No treatment Pre-procedure details:    Sensation:  Normal Procedure details:    Laterality:  Right   Location:  Knee   Knee:  R knee   Splint type:  Knee immobilizer   Supplies:  Prefabricated splint Post-procedure details:    Pain:  Unchanged   Sensation:  Normal   Skin color:  Pink   Patient tolerance of procedure:  Tolerated well, no immediate complications    ____________________________________________    CLINICAL IMPRESSION / ASSESSMENT AND PLAN / ED COURSE  Pertinent labs &  imaging results that were available during my care of the patient were reviewed by me and considered in my medical decision making (see chart for details).    Patient presents for management of right knee fracture.  Unable to view the facilities x-ray images which will be essential, so I  will obtain repeat images here and discussed with orthopedics.  Patient also wonders if her laceration could be sutured at this point.  Since the injury is nearly 29 hours old and has already been repaired and was not gaping and under tension initially, I believe Dr. Asher Muir repair is completely adequate and trying to revise the wound that is already well approximated at this point would not improve the healing.  Clinical Course as of Apr 10 1848  Nancy Fetter Apr 10, 2018  [id number] X-ray shows a nondisplaced mid patellar fracture.  Still aligned with the knee prosthesis.  Discussed with Dr. Harlow Mares of orthopedics who recommends a knee immobilizer and follow-up in clinic this week.  DG Knee Complete 4 Views Right [PS]    Clinical Course User Index [PS] Carrie Mew, MD     ----------------------------------------- 6:50 PM on 04/10/2018 -----------------------------------------  Initial fracture management provided with knee immobilizer.  Follow-up with orthopedics this week.  No other acute needs.  ____________________________________________   FINAL CLINICAL IMPRESSION(S) / ED DIAGNOSES    Final diagnoses:  Acute pain of right knee  Closed nondisplaced fracture of right patella with routine healing, unspecified fracture morphology, subsequent encounter     ED Discharge Orders    None      Portions of this note were generated with dragon dictation software. Dictation errors may occur despite best attempts at proofreading.    Carrie Mew, MD 04/10/18 [phone]

## 2018-04-10 NOTE — ED Triage Notes (Signed)
Pt arrived via EMS from [facility] c/o rt knee pain due to a fall yesterday. Pt had a X-ray performed at facility that showed a rt knee fx. Hx of bilateral knee replacement.

## 2018-04-10 NOTE — Discharge Instructions (Addendum)
Orthopedics recommends keeping a knee immobilizer on the right knee at all times to prevent bending and stress on the patella fracture until you can follow-up in the orthopedics clinic.  You should call them in 2 days to schedule an appointment for this week.  The laceration is well approximated and healing well, and suturing at this point will not improve healing.  Continue to keep the area clean, dry, and bandaged for the next week.

## 2018-07-06 ENCOUNTER — Other Ambulatory Visit: Payer: Self-pay

## 2018-07-06 NOTE — Patient Outreach (Signed)
[facility]) Care Management  07/06/2018  Meghan [name] 1945-12-05 [id number]   Medication Adherence call to Meghan [name] [place] patient is at a long term Home care patient is due on Lisinopril 2.5 mg. Meghan [name] is showing past due under [facility].   [facility] Direct Dial [phone]  Fax [phone] [email or web address]

## 2018-09-01 ENCOUNTER — Non-Acute Institutional Stay: Payer: Medicare Other | Admitting: Nurse Practitioner

## 2018-09-01 ENCOUNTER — Encounter: Payer: Self-pay | Admitting: Nurse Practitioner

## 2018-09-01 VITALS — BP 129/72 | HR 82 | Temp 97.5°F | Resp 20 | Ht 60.0 in | Wt 144.4 lb

## 2018-09-01 DIAGNOSIS — R531 Weakness: Secondary | ICD-10-CM | POA: Insufficient documentation

## 2018-09-01 DIAGNOSIS — R0602 Shortness of breath: Secondary | ICD-10-CM | POA: Insufficient documentation

## 2018-09-01 DIAGNOSIS — Z515 Encounter for palliative care: Secondary | ICD-10-CM | POA: Insufficient documentation

## 2018-09-01 NOTE — Progress Notes (Signed)
[facility] Telephone: [phone] Fax: [phone]  [name] DOB: 1946/07/31 MRN: [id number]  PRIMARY CARE PROVIDER:   Donato Schultz, MD  REFERRING PROVIDER:  Dr [facility] RESPONSIBLE PARTY:   Own responsible party, [name] granddaughter [phone]  ASSESSMENT:      I visited and observed Meghan [name]. We talked about purpose for palliative care visit and verbal consent obtained with form completed. We talked about how she was feeling today. Meghan [name] endorses that she has had a cough that has been continuing on and doesn't seem to be clearing. She talked about being on antibiotics for pneumonia. We talked at length about chronic disease progression in the setting of congestive heart failure. She talked about the fluid restriction that she adheres to. She talked about being compliant and concerned because she doesn't want to have further complications. Edema in her lower extremities has improved. We talked about residing at skilled [place]. We talked about challenges with decrease in [place]. She talked about being more fatigued and tired lately. We talked about sleep and her concerns about her overall health slowly declining. We talked about medical goals of care with focus on Comfort. We talked about role of palliative care and plan of care. Discuss that will follow up them one month if needed or sooner should she declined. She talked about Life review. She talked about feeling better with new increase in Lexapro. She talked about family Dynamics. Meghan [name] did bring up that she does have to go to court in February and became tearful. Asked if she wanted to further talk about upcoming court and she verbalize that it's confidential, but it's things that have happened to her in her past. Praised Meghan [name] for the courage to go to court. We talked about coping strategies. Therapeutic listening and emotional support  provided. Questions answered to satisfaction. Meghan [name] endorses that she was thankful for palliative care visit today. I updated staff.  1 / 3 / 2020 sodium 142, potassium 3.6, chloride 107, Co2 24, calcium 7.6, bu 13.4, creatinine 0.59, glucose 101  1 / 6 / 2020 WBC 5.5, hemoglobin 11.3, hematocrit 33.2, platelets 184  BMI 28 .2 12 / 5 / 2019 weight 147.4 lbs 1 / 4 / 2020 weight 142.7 lbs 1 / 23 / 2020 weight 144.4 lbs  RECOMMENDATIONS and PLAN: 1. Palliative care encounter Z51.5; Palliative medicine team will continue to support patient, patient's family, and medical team. Visit consisted of counseling and education dealing with the complex and emotionally intense issues of symptom management and palliative care in the setting of serious and potentially life-threatening illness  2. Dyspneic R06.00 secondary to congestive heart failure remain stable at present time. Continue daily weights and outpatient Cardiology appointments  3. Generalized weakness R53.1  secondary to CHF continue with therapy as able. Encourage energy conservation and rest times.  I spent 75 minutes providing this consultation,  from 2:15pmto 3:30pm. More than 50% of the time in this consultation was spent coordinating communication.   HISTORY OF PRESENT ILLNESS:  Meghan [name] is a 73 y.o. year old female with multiple medical problems includingCOPD, congestive heart failure with ef 20%, asthma, collagen vascular disease, coronary artery disease, heart murmur, benign brain tumor, hypertension, gerd, hypercholesterolemia, history of headache, diabetes, gerd, arthritis, osteoporosis, ICP Nia, lumbar degenerative disc disease, anxiety, PTSD, depression, kyphoplasty, right hand reconstruction 2011, cholecystectomy, cervical fusion, abdominal hysterectomy, loop recorder insertion. She does continue to  reside at still [place] for her choice. She does ambulate and use a walker at times. She does her adl's  though at times requires assistance. She does feed herself with a fair appetite and fluid restriction. She is her own responsible party. Meghan [name] is followed by Psychiatry was last date of service 08/25/2018 follow up after Lexapro was increased due to increase in depressive symptoms which she is not tolerating well. She continues on Lamictal for mood, Klonopin and Buspar for anxiety. She does take Lyrica for her migraines which have been an effective relief in addition to Percocet for pain. She does take real quick for restless leg syndrome which has been effective. She also takes Remeron for appetite stimulant which has helped. Last primary provider visit 08/19/2018 comprehensive review which noted she continues to have a seasonal cough and recently completed antibiotics for pneumonia. Weight has been labile over the last 3 months 142 to 150 pounds. She has had a mild CHF exacerbation though controlled on an extra dose of Lasix. She did receive injection for osteoporosis 12/30 / 2019 for maintenance. She is followed by in-house Podiatry. Meghan [name] does request help for her hearing loss with noted sensorineural hearing loss followed by hearing life. Medical goals focus on comfort with wishes for DNR, do not intubate, no hospitalization, no feeding tube, no surgical intervention but wishes are for antibiotics, blood transfusion, diagnostic Testing, Lab draw, IV hydration. Fall on 1/12 / 2020 with no noted injury. Care plan meeting held 08/30/2018 with Meghan [name] participating and no changes to plan of care with needs being met.At present she's sitting on her bed, appears comfortable. No visitors present. Palliative Care was asked to help address goals of care.   CODE STATUS: DNR  PPS: 50% HOSPICE ELIGIBILITY/DIAGNOSIS: TBD  PAST MEDICAL HISTORY:  Past Medical History:  Diagnosis Date  . Anginal pain ([place])   . Anxiety   . Arthritis    RA  . Asthma   . Brain tumor (benign) ([place])   . CHF (congestive heart  failure) ([place])   . Collagen vascular disease ([place])   . COPD (chronic obstructive pulmonary disease) ([place])   . Coronary artery disease   . DDD (degenerative disc disease)   . Depression   . Diabetes mellitus   . GERD (gastroesophageal reflux disease)   . Headache   . Heart murmur   . Heart murmur   . Hypercholesteremia   . Hypertension   . Lumbar degenerative disc disease   . Migraines   . Obesity   . Osteopenia   . Osteoporosis   . Pneumonia   . PTSD (post-traumatic stress disorder)     SOCIAL HX:  Social History   Tobacco Use  . Smoking status: Never Smoker  . Smokeless tobacco: Never Used  Substance Use Topics  . Alcohol use: No    Alcohol/week: 0.0 standard drinks    ALLERGIES:  Allergies  Allergen Reactions  . Amoxicillin Itching and Other (See Comments)    Has patient had a PCN reaction causing immediate rash, facial/tongue/throat swelling, SOB or lightheadedness with hypotension: Unknown Has patient had a PCN reaction causing severe rash involving mucus membranes or skin necrosis: Unknown Has patient had a PCN reaction that required hospitalization: Unknown Has patient had a PCN reaction occurring within the last 10 years: Unknown If all of the above answers are "NO", then may proceed with Cephalosporin use.   . Gabapentin Other (See Comments)    Pt states that  it causes her BP to drop.   . Naproxen Hives  . Nsaids Other (See Comments)    Reaction:  Unknown      PERTINENT MEDICATIONS:  Outpatient Encounter Medications as of 09/01/2018  Medication Sig  . busPIRone (BUSPAR) 7.5 MG tablet Take 1 tablet by mouth 2 (two) times daily.  . cefUROXime (CEFTIN) 500 MG tablet Take 1 tablet (500 mg total) by mouth 2 (two) times daily with a meal.  . Cholecalciferol 5000 units TABS Take 5,000 Units by mouth every 30 (thirty) days.  . clonazePAM (KLONOPIN) 0.5 MG tablet Take 0.125 mg by mouth 2 (two) times daily.  . cyanocobalamin 500 MCG tablet Take 500 mcg by mouth  daily.  . diclofenac sodium (VOLTAREN) 1 % GEL Apply 2 g topically 3 (three) times daily. Apply to the fingers  . diclofenac sodium (VOLTAREN) 1 % GEL Apply 4 g topically 4 (four) times daily. Apply to the left knee  . diphenoxylate-atropine (LOMOTIL) 2.5-0.025 MG tablet Take 1 tablet by mouth every 6 (six) hours as needed for diarrhea or loose stools.   Marland Kitchen escitalopram (LEXAPRO) 10 MG tablet Take 1 tablet (10 mg total) by mouth every morning.  . fluticasone (VERAMYST) 27.5 MCG/SPRAY nasal spray Place 2 sprays into the nose 2 (two) times daily.  . folic acid (FOLVITE) 1 MG tablet Take 1 mg by mouth daily.  . furosemide (LASIX) 40 MG tablet Take 40 mg by mouth 2 (two) times daily.  Marland Kitchen HYDROcodone-acetaminophen (NORCO/VICODIN) 5-325 MG tablet Take 1 tablet by mouth every 6 (six) hours as needed for severe pain.  . Ipratropium-Albuterol (COMBIVENT RESPIMAT) 20-100 MCG/ACT AERS respimat Inhale 2 puffs into the lungs every 6 (six) hours as needed for wheezing.  . Ipratropium-Albuterol (COMBIVENT RESPIMAT) 20-100 MCG/ACT AERS respimat Inhale 2 puffs into the lungs every 6 (six) hours.  Marland Kitchen lamoTRIgine (LAMICTAL) 100 MG tablet Take 1 tablet (100 mg total) by mouth daily.  Marland Kitchen lisinopril (PRINIVIL,ZESTRIL) 2.5 MG tablet Take 1 tablet by mouth 2 (two) times daily.  Marland Kitchen loperamide (IMODIUM A-D) 2 MG tablet Take 1 tablet (2 mg total) by mouth 4 (four) times daily as needed for diarrhea or loose stools. (Patient taking differently: Take 2 mg by mouth as needed for diarrhea or loose stools. )  . loratadine (CLARITIN) 10 MG tablet Take 10 mg by mouth daily.  . methotrexate (RHEUMATREX) 7.5 MG tablet Take 7.5 mg by mouth once a week. Caution" Chemotherapy. Protect from light.  . metoprolol tartrate (LOPRESSOR) 25 MG tablet Take 25 mg by mouth 2 (two) times daily.  . mirtazapine (REMERON) 7.5 MG tablet Take 7.5 mg by mouth at bedtime.   . montelukast (SINGULAIR) 10 MG tablet Take 10 mg by mouth daily.  Marland Kitchen omeprazole  (PRILOSEC) 20 MG capsule Take 20 mg by mouth daily.   Marland Kitchen oxyCODONE-acetaminophen (PERCOCET/ROXICET) 5-325 MG tablet Take 1 tablet every 4 (four) hours as needed by mouth for moderate pain or severe pain.  Marland Kitchen Potassium Chloride ER 20 MEQ TBCR Take 20 mEq by mouth 2 (two) times daily.  . pramipexole (MIRAPEX) 0.25 MG tablet Take 0.25 mg by mouth daily.   . predniSONE (DELTASONE) 5 MG tablet Take 5 mg by mouth daily with breakfast.  . pregabalin (LYRICA) 50 MG capsule TAKE ONE CAPSULE BY MOUTH TWICE A DAY  . promethazine (PHENERGAN) 25 MG suppository Place 1 suppository (25 mg total) rectally every 6 (six) hours as needed for nausea.  . saxagliptin HCl (ONGLYZA) 5 MG TABS tablet Take  5 mg by mouth at bedtime.  . Tofacitinib Citrate 5 MG TABS Take 5 mg by mouth 2 (two) times daily after a meal.   . topiramate (TOPAMAX) 25 MG tablet Take 25 mg by mouth at bedtime.    No facility-administered encounter medications on file as of 09/01/2018.     PHYSICAL EXAM:   General: chronically ill, pleasant female Cardiovascular: regular rate and rhythm Pulmonary: clear ant fields Abdomen: soft, nontender, + bowel sounds GU: no suprapubic tenderness Extremities: no edema, no joint deformities Skin: no rashes Neurological: Weakness but otherwise nonfocal/ambulatory   Ihor Gully, NP

## 2018-10-20 ENCOUNTER — Inpatient Hospital Stay
Admission: EM | Admit: 2018-10-20 | Discharge: 2018-10-21 | DRG: 309 | Disposition: A | Discharge status: Other Acute Inpatient Hospital | Payer: Medicare Other | Source: Skilled Nursing Facility | Attending: Internal Medicine | Admitting: Internal Medicine

## 2018-10-20 ENCOUNTER — Other Ambulatory Visit: Payer: Self-pay

## 2018-10-20 ENCOUNTER — Emergency Department: Payer: Medicare Other

## 2018-10-20 DIAGNOSIS — Z8249 Family history of ischemic heart disease and other diseases of the circulatory system: Secondary | ICD-10-CM

## 2018-10-20 DIAGNOSIS — N390 Urinary tract infection, site not specified: Secondary | ICD-10-CM | POA: Diagnosis present

## 2018-10-20 DIAGNOSIS — S72491A Other fracture of lower end of right femur, initial encounter for closed fracture: Secondary | ICD-10-CM | POA: Diagnosis present

## 2018-10-20 DIAGNOSIS — S72421A Displaced fracture of lateral condyle of right femur, initial encounter for closed fracture: Secondary | ICD-10-CM

## 2018-10-20 DIAGNOSIS — I442 Atrioventricular block, complete: Secondary | ICD-10-CM | POA: Diagnosis not present

## 2018-10-20 DIAGNOSIS — I110 Hypertensive heart disease with heart failure: Secondary | ICD-10-CM | POA: Diagnosis present

## 2018-10-20 DIAGNOSIS — W010XXA Fall on same level from slipping, tripping and stumbling without subsequent striking against object, initial encounter: Secondary | ICD-10-CM | POA: Diagnosis present

## 2018-10-20 DIAGNOSIS — Z86011 Personal history of benign neoplasm of the brain: Secondary | ICD-10-CM

## 2018-10-20 DIAGNOSIS — I2510 Atherosclerotic heart disease of native coronary artery without angina pectoris: Secondary | ICD-10-CM | POA: Diagnosis present

## 2018-10-20 DIAGNOSIS — Y92121 Bathroom in nursing home as the place of occurrence of the external cause: Secondary | ICD-10-CM

## 2018-10-20 DIAGNOSIS — Z7952 Long term (current) use of systemic steroids: Secondary | ICD-10-CM

## 2018-10-20 DIAGNOSIS — E782 Mixed hyperlipidemia: Secondary | ICD-10-CM | POA: Diagnosis present

## 2018-10-20 DIAGNOSIS — M9711XA Periprosthetic fracture around internal prosthetic right knee joint, initial encounter: Secondary | ICD-10-CM | POA: Diagnosis present

## 2018-10-20 DIAGNOSIS — I509 Heart failure, unspecified: Secondary | ICD-10-CM | POA: Diagnosis present

## 2018-10-20 DIAGNOSIS — F4310 Post-traumatic stress disorder, unspecified: Secondary | ICD-10-CM | POA: Diagnosis present

## 2018-10-20 DIAGNOSIS — E119 Type 2 diabetes mellitus without complications: Secondary | ICD-10-CM | POA: Diagnosis present

## 2018-10-20 DIAGNOSIS — J449 Chronic obstructive pulmonary disease, unspecified: Secondary | ICD-10-CM | POA: Diagnosis present

## 2018-10-20 DIAGNOSIS — S0081XA Abrasion of other part of head, initial encounter: Secondary | ICD-10-CM | POA: Diagnosis present

## 2018-10-20 DIAGNOSIS — S72431A Displaced fracture of medial condyle of right femur, initial encounter for closed fracture: Secondary | ICD-10-CM

## 2018-10-20 DIAGNOSIS — F419 Anxiety disorder, unspecified: Secondary | ICD-10-CM | POA: Diagnosis present

## 2018-10-20 DIAGNOSIS — Z880 Allergy status to penicillin: Secondary | ICD-10-CM

## 2018-10-20 DIAGNOSIS — Z66 Do not resuscitate: Secondary | ICD-10-CM | POA: Diagnosis present

## 2018-10-20 DIAGNOSIS — I447 Left bundle-branch block, unspecified: Secondary | ICD-10-CM | POA: Diagnosis present

## 2018-10-20 DIAGNOSIS — F329 Major depressive disorder, single episode, unspecified: Secondary | ICD-10-CM | POA: Diagnosis present

## 2018-10-20 DIAGNOSIS — D649 Anemia, unspecified: Secondary | ICD-10-CM | POA: Diagnosis present

## 2018-10-20 DIAGNOSIS — Z96653 Presence of artificial knee joint, bilateral: Secondary | ICD-10-CM | POA: Diagnosis present

## 2018-10-20 MED ORDER — FENTANYL CITRATE (PF) 100 MCG/2ML IJ SOLN
50.0000 ug | INTRAMUSCULAR | Status: DC | PRN
  Administered 2018-10-20 (×2): 50 ug via INTRAVENOUS
  Administered 2018-10-21: 25 ug via INTRAVENOUS
  Administered 2018-10-21 (×2): 50 ug via INTRAVENOUS
  Filled 2018-10-20 (×3): qty 2

## 2018-10-20 NOTE — ED Provider Notes (Signed)
[facility] Emergency Department Provider Note    First MD Initiated Contact with Patient 10/20/18 2317     (approximate)  I have reviewed the triage vital signs and the nursing notes.   HISTORY  Chief Complaint Leg Injury and Fall    HPI Meghan [name] is a 73 y.o. female   below listed past medical history presents to the ER for evaluation of severe right leg pain occurred after mechanical fall yesterday.  Did fall and hit her head.  She had an outpatient x-ray of the right knee showing evidence of suprapatellar fracture.  Is unable to move the right leg.  Denies any chest pain or shortness of breath.  No fevers.  She arrives with DNR paperwork.   Past Medical History:  Diagnosis Date  . Anginal pain ([place])   . Anxiety   . Arthritis    RA  . Asthma   . Brain tumor (benign) ([place])   . CHF (congestive heart failure) ([place])   . Collagen vascular disease ([place])   . COPD (chronic obstructive pulmonary disease) ([place])   . Coronary artery disease   . DDD (degenerative disc disease)   . Depression   . Diabetes mellitus   . GERD (gastroesophageal reflux disease)   . Headache   . Heart murmur   . Heart murmur   . Hypercholesteremia   . Hypertension   . Lumbar degenerative disc disease   . Migraines   . Obesity   . Osteopenia   . Osteoporosis   . Pneumonia   . PTSD (post-traumatic stress disorder)    Family History  Problem Relation Age of Onset  . Depression Sister   . Migraines Sister   . Hypertension Mother   . Arthritis/Rheumatoid Mother   . Heart attack Father   . Hypertension Father   . CAD Other   . Hypertension Other   . Diabetes Mellitus II Other   . Arthritis Other    Past Surgical History:  Procedure Laterality Date  . ABDOMINAL HYSTERECTOMY    . CERVICAL FUSION    . CHOLECYSTECTOMY    . COLONOSCOPY WITH PROPOFOL N/A 09/20/2015   Procedure: COLONOSCOPY WITH PROPOFOL;  Surgeon: Josefine Class, MD;  Location: [facility] ENDOSCOPY;   Service: Endoscopy;  Laterality: N/A;  . COLONOSCOPY WITH PROPOFOL N/A 01/27/2016   Procedure: COLONOSCOPY WITH PROPOFOL;  Surgeon: Manya Silvas, MD;  Location: [facility] ENDOSCOPY;  Service: Endoscopy;  Laterality: N/A;  . FINGER ARTHROPLASTY  02/23/2012   Procedure: FINGER ARTHROPLASTY;  Surgeon: Cammie Sickle., MD;  Location: [facility];  Service: Orthopedics;  Laterality: Left;  Extensor carpi radialis longus to Extensor carpi ulnaris transfer, left Metaphalangeal reconstructions of index and long fingers,  . FOOT ARTHROPLASTY     toes x2 rt foot  . HAND RECONSTRUCTION  2011   right-multiple finger joint reconst  . JOINT REPLACEMENT     bilat knee replacements  . KYPHOPLASTY N/A 06/14/2017   Procedure: KYPHOPLASTY L1;  Surgeon: Hessie Knows, MD;  Location: [facility] ORS;  Service: Orthopedics;  Laterality: N/A;  . LOOP RECORDER INSERTION N/A 06/23/2017   Procedure: LOOP RECORDER INSERTION;  Surgeon: Isaias Cowman, MD;  Location: [facility] CV LAB;  Service: Cardiovascular;  Laterality: N/A;   Patient Active Problem List   Diagnosis Date Noted  . Complete heart block ([place]) 10/21/2018  . Palliative care encounter 09/01/2018  . Weakness generalized 09/01/2018  . Shortness of breath 09/01/2018  . Syncope 05/05/2017  .  Acute on chronic systolic CHF (congestive heart failure) ([place]) 04/16/2017  . Acute lower UTI 04/16/2017  . Acute respiratory failure ([place]) 04/16/2017  . Pressure injury of skin 03/22/2017  . HCAP (healthcare-associated pneumonia) 03/20/2017  . Acute on chronic respiratory failure with hypoxia ([place]) 03/20/2017  . Acute respiratory failure with hypoxia ([place]) 03/20/2017  . Near syncope 02/27/2017  . Dehydration 02/27/2017  . Colitis 02/22/2017  . Seizure ([place]) 01/03/2016  . Chronic tension-type headache, intractable 12/04/2015  . 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 Degeneration of intervertebral disc of lumbar region 07/15/2015  . Seropositive  rheumatoid arthritis ([place]) 07/15/2015  . Asthma with acute exacerbation 03/10/2015  . Hypokalemia 03/01/2015  . Hyponatremia 03/01/2015  . DDD (degenerative disc disease), lumbar 01/31/2015  . Arthritis, degenerative 01/31/2015  . Rheumatoid arthritis with rheumatoid factor ([place]) 01/31/2015  . HTN (hypertension) 12/24/2014  . Sepsis ([place]) 12/24/2014  . Left knee pain 12/24/2014  . GERD (gastroesophageal reflux disease) 12/24/2014  . COPD (chronic obstructive pulmonary disease) ([place]) 12/24/2014  . Depression 12/24/2014  . Anxiety 12/24/2014  . Severe bipolar disorder with psychotic features, mood-congruent ([place]) 11/16/2014  . Neurosis, posttraumatic 11/16/2014  . H/O gastric ulcer 11/16/2014  . Barton's fracture of distal radius, closed 09/19/2014  . Neuritis or radiculitis due to rupture of lumbar intervertebral disc 05/11/2014  . Cervico-occipital neuralgia 03/26/2014  . Difficulty in walking 03/26/2014  . Difficulty in walking, not elsewhere classified 03/26/2014  . Cervical spine syndrome 01/26/2014  . Cephalalgia 01/09/2014  . Disordered sleep 01/09/2014  . BP (high blood pressure) 10/19/2013  . Adiposity 10/19/2013  . Cardiac murmur 10/19/2013  . PNA (pneumonia) 10/19/2013  . Breath shortness 10/19/2013  . Chronic obstructive pulmonary disease ([place]) 10/19/2013  . Diabetes mellitus ([place]) 10/19/2013      Prior to Admission medications   Medication Sig Start Date End Date Taking? Authorizing Provider  acetaminophen (TYLENOL) 325 MG tablet Take 650 mg by mouth every 6 (six) hours as needed.   Yes [provider]  busPIRone (BUSPAR) 7.5 MG tablet Take 1 tablet by mouth 2 (two) times daily.   Yes [provider]  Cholecalciferol 5000 units TABS Take 5,000 Units by mouth every 30 (thirty) days.   Yes [provider]  clonazePAM (KLONOPIN) 0.5 MG tablet Take 0.125 mg by mouth 2 (two) times daily.   Yes [provider]  cyanocobalamin 500 MCG  tablet Take 500 mcg by mouth daily.   Yes [provider]  diclofenac sodium (VOLTAREN) 1 % GEL Apply 2 g topically 3 (three) times daily. Apply to the fingers   Yes [provider]  diphenoxylate-atropine (LOMOTIL) 2.5-0.025 MG tablet Take 1 tablet by mouth every 6 (six) hours as needed for diarrhea or loose stools.    Yes [provider]  escitalopram (LEXAPRO) 10 MG tablet Take 1 tablet (10 mg total) by mouth every morning. Patient taking differently: Take 20 mg by mouth every morning.  09/11/16  Yes Rainey Pines, MD  fluticasone (VERAMYST) 27.5 MCG/SPRAY nasal spray Place 2 sprays into the nose 2 (two) times daily.   Yes [provider]  folic acid (FOLVITE) 1 MG tablet Take 1 mg by mouth daily.   Yes [provider]  furosemide (LASIX) 40 MG tablet Take 40 mg by mouth 2 (two) times daily.   Yes [provider]  Ipratropium-Albuterol (COMBIVENT RESPIMAT) 20-100 MCG/ACT AERS respimat Inhale 2 puffs into the lungs every 6 (six) hours as needed for wheezing.   Yes [provider]  Ipratropium-Albuterol (COMBIVENT RESPIMAT) 20-100 MCG/ACT AERS respimat Inhale 2 puffs into the lungs every 6 (six) hours.   Yes [provider]  lamoTRIgine (LAMICTAL) 100 MG tablet Take 1 tablet (100 mg total) by mouth daily. 06/02/16  Yes Rainey Pines, MD  lisinopril (PRINIVIL,ZESTRIL) 2.5 MG tablet Take 1 tablet by mouth 2 (two) times daily.   Yes [provider]  loperamide (IMODIUM A-D) 2 MG tablet Take 1 tablet (2 mg total) by mouth 4 (four) times daily as needed for diarrhea or loose stools. Patient taking differently: Take 2 mg by mouth as needed for diarrhea or loose stools.  05/10/17  Yes Eula Listen, MD  loratadine (CLARITIN) 10 MG tablet Take 10 mg by mouth daily.   Yes [provider]  metoprolol tartrate (LOPRESSOR) 25 MG tablet Take 25 mg by mouth 2 (two) times daily.   Yes [provider]  mirtazapine  (REMERON) 7.5 MG tablet Take 7.5 mg by mouth at bedtime.    Yes [provider]  montelukast (SINGULAIR) 10 MG tablet Take 10 mg by mouth daily.   Yes [provider]  omeprazole (PRILOSEC) 20 MG capsule Take 20 mg by mouth daily.    Yes [provider]  oxyCODONE-acetaminophen (PERCOCET/ROXICET) 5-325 MG tablet Take 1 tablet every 4 (four) hours as needed by mouth for moderate pain or severe pain. 06/15/17  Yes Henreitta Leber, MD  Potassium Chloride ER 20 MEQ TBCR Take 20 mEq by mouth 2 (two) times daily. 05/07/17  Yes Vaughan Basta, MD  pramipexole (MIRAPEX) 0.25 MG tablet Take 0.25 mg by mouth daily.    Yes [provider]  predniSONE (DELTASONE) 5 MG tablet Take 5 mg by mouth daily with breakfast.   Yes [provider]  pregabalin (LYRICA) 50 MG capsule TAKE ONE CAPSULE BY MOUTH TWICE A DAY 06/18/15  Yes [provider]  saxagliptin HCl (ONGLYZA) 5 MG TABS tablet Take 5 mg by mouth at bedtime.   Yes [provider]  Tofacitinib Citrate 5 MG TABS Take 5 mg by mouth 2 (two) times daily after a meal.    Yes [provider]  topiramate (TOPAMAX) 25 MG tablet Take 25 mg by mouth at bedtime.    Yes [provider]  cefUROXime (CEFTIN) 500 MG tablet Take 1 tablet (500 mg total) by mouth 2 (two) times daily with a meal. Patient not taking: Reported on 10/21/2018 08/28/17   Bettey Costa, MD  methotrexate (RHEUMATREX) 7.5 MG tablet Take 7.5 mg by mouth once a week. Caution" Chemotherapy. Protect from light.    [provider]  promethazine (PHENERGAN) 25 MG suppository Place 1 suppository (25 mg total) rectally every 6 (six) hours as needed for nausea. 05/10/17 05/10/18  Eula Listen, MD    Allergies Amoxicillin; Gabapentin; Naproxen; and Nsaids    Social History Social History   Tobacco Use  . Smoking status: Never Smoker  . Smokeless tobacco: Never Used  Substance Use Topics  . Alcohol use:  No    Alcohol/week: 0.0 standard drinks  . Drug use: No    Review of Systems Patient denies headaches, rhinorrhea, blurry vision, numbness, shortness of breath, chest pain, edema, cough, abdominal pain, nausea, vomiting, diarrhea, dysuria, fevers, rashes or hallucinations unless otherwise stated above in HPI. ____________________________________________   PHYSICAL EXAM:  VITAL SIGNS: Vitals:   10/21/18 0300 10/21/18 0400  BP: 121/61 (!) 108/49  Pulse: (!) 34 (!) 47  Resp: 16 14  Temp:  SpO2: 95% 97%    Constitutional: Alert and oriented.  Eyes: Conjunctivae are normal.  Head: contusion to the left face Nose: No congestion/rhinnorhea. Mouth/Throat: Mucous membranes are moist.   Neck: No stridor. Painless ROM.  Cardiovascular: bradycardic regular rhythm. Grossly normal heart sounds.  Good peripheral circulation. Respiratory: Normal respiratory effort.  No retractions. Lungs CTAB. Gastrointestinal: Soft and nontender. No distention. No abdominal bruits. No CVA tenderness. Genitourinary:  Musculoskeletal: ttp of right leg, N/V intact distally  No joint effusions. Neurologic:  Normal speech and language. No gross focal neurologic deficits are appreciated. No facial droop Skin:  Skin is warm, dry and intact. No rash noted. Psychiatric: Mood and affect are normal. Speech and behavior are normal.  ____________________________________________   LABS (all labs ordered are listed, but only abnormal results are displayed)  Results for orders placed or performed during the hospital encounter of 10/20/18 (from the past 24 hour(s))  CBC with Differential/Platelet     Status: Abnormal   Collection Time: 10/21/18  1:00 AM  Result Value Ref Range   WBC 10.0 4.0 - 10.5 K/uL   RBC 3.86 (L) 3.87 - 5.11 MIL/uL   Hemoglobin 11.2 (L) 12.0 - 15.0 g/dL   HCT 34.2 (L) 36.0 - 46.0 %   MCV 88.6 80.0 - 100.0 fL   MCH 29.0 26.0 - 34.0 pg   MCHC 32.7 30.0 - 36.0 g/dL   RDW 15.3 11.5 - 15.5 %    Platelets 262 150 - 400 K/uL   nRBC 0.0 0.0 - 0.2 %   Neutrophils Relative % 75 %   Neutro Abs 7.5 1.7 - 7.7 K/uL   Lymphocytes Relative 15 %   Lymphs Abs 1.5 0.7 - 4.0 K/uL   Monocytes Relative 9 %   Monocytes Absolute 0.9 0.1 - 1.0 K/uL   Eosinophils Relative 0 %   Eosinophils Absolute 0.0 0.0 - 0.5 K/uL   Basophils Relative 0 %   Basophils Absolute 0.0 0.0 - 0.1 K/uL   Immature Granulocytes 1 %   Abs Immature Granulocytes 0.09 (H) 0.00 - 0.07 K/uL  Comprehensive metabolic panel     Status: Abnormal   Collection Time: 10/21/18  1:00 AM  Result Value Ref Range   Sodium 140 135 - 145 mmol/L   Potassium 3.7 3.5 - 5.1 mmol/L   Chloride 108 98 - 111 mmol/L   CO2 22 22 - 32 mmol/L   Glucose, Bld 91 70 - 99 mg/dL   BUN 15 8 - 23 mg/dL   Creatinine, Ser 0.81 0.44 - 1.00 mg/dL   Calcium 7.6 (L) 8.9 - 10.3 mg/dL   Total Protein 5.9 (L) 6.5 - 8.1 g/dL   Albumin 3.2 (L) 3.5 - 5.0 g/dL   AST 32 15 - 41 U/L   ALT 34 0 - 44 U/L   Alkaline Phosphatase 62 38 - 126 U/L   Total Bilirubin 1.3 (H) 0.3 - 1.2 mg/dL   GFR calc non Af Amer >60 >60 mL/min   GFR calc Af Amer >60 >60 mL/min   Anion gap 10 5 - 15  Type and screen [place]     Status: None   Collection Time: 10/21/18  1:00 AM  Result Value Ref Range   ABO/RH(D) O NEG    Antibody Screen NEG    Sample Expiration      10/24/2018 Performed at [facility], [address] [place], [place] [address]   Troponin I - ONCE - STAT  Status: None   Collection Time: 10/21/18  1:00 AM  Result Value Ref Range   Troponin I <0.03 <0.03 ng/mL   ____________________________________________  EKG My review and personal interpretation at Time: 23:05   Indication: fall  Rate: 40  Rhythm: sinus with first degree block Axis: left Other: bradycardia, lbbb, no stem ____________________________________________  RADIOLOGY  Mix one tablespoon with 8oz of your favorite juice or water every day until you are  having soft formed stools. Then start taking once daily if you didn't have a stool the day before.  ____________________________________________   PROCEDURES  Procedure(s) performed:  .Critical Care Performed by: Merlyn Lot, MD Authorized by: Merlyn Lot, MD   Critical care provider statement:    Critical care time (minutes):  40   Critical care time was exclusive of:  Separately billable procedures and treating other patients   Critical care was necessary to treat or prevent imminent or life-threatening deterioration of the following conditions:  Cardiac failure   Critical care was time spent personally by me on the following activities:  Development of treatment plan with patient or surrogate, discussions with consultants, evaluation of patient's response to treatment, examination of patient, obtaining history from patient or surrogate, ordering and performing treatments and interventions, ordering and review of laboratory studies, ordering and review of radiographic studies, pulse oximetry, re-evaluation of patient's condition and review of old charts      Critical Care performed: yes ____________________________________________   INITIAL IMPRESSION / [place] / ED COURSE  Pertinent labs & imaging results that were available during my care of the patient were reviewed by me and considered in my medical decision making (see chart for details).   DDX: fracture, contusion, electrolyte abn, anemia, dysrhythmia, sdh, sah  Meghan [name] is a 73 y.o. who presents to the ED with obvious deformity and probable fracture to the right knee after syncopal episode.  Will provide IV pain medication.  Patient will have CT imaging does she does have contusion of the left face.  Blood will be sent for the above differential.  Clinical Course as of Oct 20 433  Fri Oct 21, 2018  0049 Patient with evidence of distal femur fracture.  Heart rate now is in the 80s.  Will give  gentle IV hydration as well as nebulizer she does have a history of COPD.  Still waiting blood work but will be admitted to the hospital for further medical management.   [PR]  3419 With evidence of complete heart block on monitor.  This seems to be intermittent as heart rate was up to 50s a few moments ago.  Troponin negative.  Pacer pads were placed.  Patient is uncertain as to whether she would want any life prolonging procedures such as pacemaker or permanent pacemaker if needed.   [PR]  0221 Discussed the case and evidence of complete heart block with Dr. Nehemiah Massed cardiology.  Patient will be admitted to the hospital for further hemodynamic monitoring cardiology evaluation for possible temp pacer and clearance prior to ortho surgery.   [PR]    Clinical Course User Index [PR] Merlyn Lot, MD     As part of my medical decision making, I reviewed the following data within the [place] notes reviewed and incorporated, Labs reviewed, notes from prior ED visits and [place] Controlled Substance Database   ____________________________________________   FINAL CLINICAL IMPRESSION(S) / ED DIAGNOSES  Final diagnoses:  Complete heart block ([place])  Closed bicondylar fracture of right  femur, initial encounter ([place])      NEW MEDICATIONS STARTED DURING THIS VISIT:  New Prescriptions   No medications on file     Note:  This document was prepared using Dragon voice recognition software and may include unintentional dictation errors.    Merlyn Lot, MD 10/21/18 [phone]

## 2018-10-20 NOTE — ED Triage Notes (Signed)
Pt BIB EMS from [facility] care center  for reported positive femur fx to right leg from outpatient XR. Pt reports a mechanical fall yesterday, she did hit her head, denies any blood thinners. Alert and oriented in triage. HR sinus brady with a rate of 42.

## 2018-10-20 NOTE — ED Notes (Signed)
Patient transported to X-ray 

## 2018-10-21 ENCOUNTER — Encounter (HOSPITAL_COMMUNITY)
Admission: AD | Disposition: A | Discharge status: Skilled Nursing Facility | Payer: Self-pay | Source: Other Acute Inpatient Hospital | Attending: Internal Medicine

## 2018-10-21 ENCOUNTER — Encounter
Admission: EM | Disposition: A | Discharge status: Other Acute Inpatient Hospital | Payer: Self-pay | Source: Home / Self Care | Attending: Internal Medicine

## 2018-10-21 ENCOUNTER — Inpatient Hospital Stay
Admit: 2018-10-21 | Discharge: 2018-10-21 | Disposition: A | Discharge status: Home / Self Care | Payer: Medicare Other | Attending: Pulmonary Disease | Admitting: Pulmonary Disease

## 2018-10-21 ENCOUNTER — Encounter (HOSPITAL_COMMUNITY): Payer: Self-pay

## 2018-10-21 ENCOUNTER — Inpatient Hospital Stay (HOSPITAL_COMMUNITY): Payer: Medicare Other

## 2018-10-21 ENCOUNTER — Other Ambulatory Visit: Payer: Self-pay

## 2018-10-21 ENCOUNTER — Inpatient Hospital Stay (HOSPITAL_COMMUNITY)
Admission: AD | Admit: 2018-10-21 | Discharge: 2018-10-24 | DRG: 243 | Disposition: A | Discharge status: Skilled Nursing Facility | Payer: Medicare Other | Source: Other Acute Inpatient Hospital | Attending: Internal Medicine | Admitting: Internal Medicine

## 2018-10-21 DIAGNOSIS — Z8781 Personal history of (healed) traumatic fracture: Secondary | ICD-10-CM

## 2018-10-21 DIAGNOSIS — F4310 Post-traumatic stress disorder, unspecified: Secondary | ICD-10-CM | POA: Diagnosis present

## 2018-10-21 DIAGNOSIS — I110 Hypertensive heart disease with heart failure: Secondary | ICD-10-CM | POA: Diagnosis present

## 2018-10-21 DIAGNOSIS — Y92121 Bathroom in nursing home as the place of occurrence of the external cause: Secondary | ICD-10-CM | POA: Diagnosis not present

## 2018-10-21 DIAGNOSIS — S0081XA Abrasion of other part of head, initial encounter: Secondary | ICD-10-CM | POA: Diagnosis present

## 2018-10-21 DIAGNOSIS — W1811XA Fall from or off toilet without subsequent striking against object, initial encounter: Secondary | ICD-10-CM | POA: Diagnosis not present

## 2018-10-21 DIAGNOSIS — I442 Atrioventricular block, complete: Principal | ICD-10-CM

## 2018-10-21 DIAGNOSIS — Z888 Allergy status to other drugs, medicaments and biological substances status: Secondary | ICD-10-CM | POA: Diagnosis not present

## 2018-10-21 DIAGNOSIS — E7800 Pure hypercholesterolemia, unspecified: Secondary | ICD-10-CM | POA: Diagnosis present

## 2018-10-21 DIAGNOSIS — Z01818 Encounter for other preprocedural examination: Secondary | ICD-10-CM | POA: Diagnosis not present

## 2018-10-21 DIAGNOSIS — D62 Acute posthemorrhagic anemia: Secondary | ICD-10-CM | POA: Diagnosis not present

## 2018-10-21 DIAGNOSIS — Z886 Allergy status to analgesic agent status: Secondary | ICD-10-CM | POA: Diagnosis not present

## 2018-10-21 DIAGNOSIS — Z959 Presence of cardiac and vascular implant and graft, unspecified: Secondary | ICD-10-CM

## 2018-10-21 DIAGNOSIS — I4891 Unspecified atrial fibrillation: Secondary | ICD-10-CM | POA: Diagnosis present

## 2018-10-21 DIAGNOSIS — M1990 Unspecified osteoarthritis, unspecified site: Secondary | ICD-10-CM | POA: Diagnosis present

## 2018-10-21 DIAGNOSIS — E2740 Unspecified adrenocortical insufficiency: Secondary | ICD-10-CM | POA: Diagnosis present

## 2018-10-21 DIAGNOSIS — Z881 Allergy status to other antibiotic agents status: Secondary | ICD-10-CM | POA: Diagnosis not present

## 2018-10-21 DIAGNOSIS — I447 Left bundle-branch block, unspecified: Secondary | ICD-10-CM | POA: Diagnosis present

## 2018-10-21 DIAGNOSIS — Z419 Encounter for procedure for purposes other than remedying health state, unspecified: Secondary | ICD-10-CM

## 2018-10-21 DIAGNOSIS — S72401D Unspecified fracture of lower end of right femur, subsequent encounter for closed fracture with routine healing: Secondary | ICD-10-CM | POA: Diagnosis not present

## 2018-10-21 DIAGNOSIS — Z9889 Other specified postprocedural states: Secondary | ICD-10-CM | POA: Diagnosis not present

## 2018-10-21 DIAGNOSIS — Z66 Do not resuscitate: Secondary | ICD-10-CM | POA: Diagnosis present

## 2018-10-21 DIAGNOSIS — I6529 Occlusion and stenosis of unspecified carotid artery: Secondary | ICD-10-CM | POA: Diagnosis present

## 2018-10-21 DIAGNOSIS — F319 Bipolar disorder, unspecified: Secondary | ICD-10-CM

## 2018-10-21 DIAGNOSIS — Z8711 Personal history of peptic ulcer disease: Secondary | ICD-10-CM

## 2018-10-21 DIAGNOSIS — N390 Urinary tract infection, site not specified: Secondary | ICD-10-CM | POA: Diagnosis present

## 2018-10-21 DIAGNOSIS — K219 Gastro-esophageal reflux disease without esophagitis: Secondary | ICD-10-CM | POA: Diagnosis present

## 2018-10-21 DIAGNOSIS — I2510 Atherosclerotic heart disease of native coronary artery without angina pectoris: Secondary | ICD-10-CM | POA: Diagnosis present

## 2018-10-21 DIAGNOSIS — Z86011 Personal history of benign neoplasm of the brain: Secondary | ICD-10-CM | POA: Diagnosis not present

## 2018-10-21 DIAGNOSIS — Z880 Allergy status to penicillin: Secondary | ICD-10-CM | POA: Diagnosis not present

## 2018-10-21 DIAGNOSIS — Z818 Family history of other mental and behavioral disorders: Secondary | ICD-10-CM | POA: Diagnosis not present

## 2018-10-21 DIAGNOSIS — I10 Essential (primary) hypertension: Secondary | ICD-10-CM | POA: Diagnosis not present

## 2018-10-21 DIAGNOSIS — F419 Anxiety disorder, unspecified: Secondary | ICD-10-CM | POA: Diagnosis present

## 2018-10-21 DIAGNOSIS — I509 Heart failure, unspecified: Secondary | ICD-10-CM | POA: Diagnosis present

## 2018-10-21 DIAGNOSIS — G43909 Migraine, unspecified, not intractable, without status migrainosus: Secondary | ICD-10-CM | POA: Diagnosis present

## 2018-10-21 DIAGNOSIS — E119 Type 2 diabetes mellitus without complications: Secondary | ICD-10-CM | POA: Diagnosis present

## 2018-10-21 DIAGNOSIS — R55 Syncope and collapse: Secondary | ICD-10-CM

## 2018-10-21 DIAGNOSIS — M5136 Other intervertebral disc degeneration, lumbar region: Secondary | ICD-10-CM | POA: Diagnosis present

## 2018-10-21 DIAGNOSIS — Z981 Arthrodesis status: Secondary | ICD-10-CM

## 2018-10-21 DIAGNOSIS — M9711XA Periprosthetic fracture around internal prosthetic right knee joint, initial encounter: Secondary | ICD-10-CM | POA: Diagnosis present

## 2018-10-21 DIAGNOSIS — Z8249 Family history of ischemic heart disease and other diseases of the circulatory system: Secondary | ICD-10-CM | POA: Diagnosis not present

## 2018-10-21 DIAGNOSIS — M059 Rheumatoid arthritis with rheumatoid factor, unspecified: Secondary | ICD-10-CM

## 2018-10-21 DIAGNOSIS — Z7952 Long term (current) use of systemic steroids: Secondary | ICD-10-CM | POA: Diagnosis not present

## 2018-10-21 DIAGNOSIS — S72431A Displaced fracture of medial condyle of right femur, initial encounter for closed fracture: Secondary | ICD-10-CM | POA: Diagnosis not present

## 2018-10-21 DIAGNOSIS — M810 Age-related osteoporosis without current pathological fracture: Secondary | ICD-10-CM | POA: Diagnosis present

## 2018-10-21 DIAGNOSIS — Z96653 Presence of artificial knee joint, bilateral: Secondary | ICD-10-CM | POA: Diagnosis present

## 2018-10-21 DIAGNOSIS — S72421A Displaced fracture of lateral condyle of right femur, initial encounter for closed fracture: Secondary | ICD-10-CM

## 2018-10-21 DIAGNOSIS — E782 Mixed hyperlipidemia: Secondary | ICD-10-CM | POA: Diagnosis present

## 2018-10-21 DIAGNOSIS — J449 Chronic obstructive pulmonary disease, unspecified: Secondary | ICD-10-CM

## 2018-10-21 DIAGNOSIS — S72401A Unspecified fracture of lower end of right femur, initial encounter for closed fracture: Secondary | ICD-10-CM | POA: Diagnosis present

## 2018-10-21 DIAGNOSIS — Z8719 Personal history of other diseases of the digestive system: Secondary | ICD-10-CM

## 2018-10-21 DIAGNOSIS — F329 Major depressive disorder, single episode, unspecified: Secondary | ICD-10-CM | POA: Diagnosis present

## 2018-10-21 DIAGNOSIS — S72491A Other fracture of lower end of right femur, initial encounter for closed fracture: Secondary | ICD-10-CM | POA: Diagnosis present

## 2018-10-21 DIAGNOSIS — D649 Anemia, unspecified: Secondary | ICD-10-CM | POA: Diagnosis present

## 2018-10-21 DIAGNOSIS — W010XXA Fall on same level from slipping, tripping and stumbling without subsequent striking against object, initial encounter: Secondary | ICD-10-CM | POA: Diagnosis present

## 2018-10-21 DIAGNOSIS — M80851A Other osteoporosis with current pathological fracture, right femur, initial encounter for fracture: Secondary | ICD-10-CM | POA: Diagnosis present

## 2018-10-21 DIAGNOSIS — E109 Type 1 diabetes mellitus without complications: Secondary | ICD-10-CM

## 2018-10-21 HISTORY — PX: LOOP RECORDER REMOVAL: EP1215

## 2018-10-21 HISTORY — PX: PACEMAKER IMPLANT: EP1218

## 2018-10-21 LAB — COMPREHENSIVE METABOLIC PANEL
ALBUMIN: 3.2 g/dL — AB (ref 3.5–5.0)
ALBUMIN: 3 g/dL — AB (ref 3.5–5.0)
ALT: 34 U/L (ref 0–44)
ALT: 31 U/L (ref 0–44)
AST: 32 U/L (ref 15–41)
AST: 27 U/L (ref 15–41)
Alkaline Phosphatase: 62 U/L (ref 38–126)
Alkaline Phosphatase: 62 U/L (ref 38–126)
Anion gap: 10 (ref 5–15)
Anion gap: 9 (ref 5–15)
BUN: 15 mg/dL (ref 8–23)
BUN: 18 mg/dL (ref 8–23)
CHLORIDE: 108 mmol/L (ref 98–111)
CO2: 22 mmol/L (ref 22–32)
CO2: 22 mmol/L (ref 22–32)
Calcium: 7.6 mg/dL — ABNORMAL LOW (ref 8.9–10.3)
Calcium: 7.7 mg/dL — ABNORMAL LOW (ref 8.9–10.3)
Chloride: 110 mmol/L (ref 98–111)
Creatinine, Ser: 0.81 mg/dL (ref 0.44–1.00)
Creatinine, Ser: 0.89 mg/dL (ref 0.44–1.00)
GFR calc Af Amer: >60 mL/min (ref >60)
GFR calc Af Amer: >60 mL/min (ref >60)
GFR calc non Af Amer: >60 mL/min (ref >60)
GLUCOSE: 96 mg/dL (ref 70–99)
Glucose, Bld: 91 mg/dL (ref 70–99)
POTASSIUM: 3.7 mmol/L (ref 3.5–5.1)
Potassium: 3.7 mmol/L (ref 3.5–5.1)
SODIUM: 140 mmol/L (ref 135–145)
Sodium: 141 mmol/L (ref 135–145)
Total Bilirubin: 1.3 mg/dL — ABNORMAL HIGH (ref 0.3–1.2)
Total Bilirubin: 1.3 mg/dL — ABNORMAL HIGH (ref 0.3–1.2)
Total Protein: 5.9 g/dL — ABNORMAL LOW (ref 6.5–8.1)
Total Protein: 6 g/dL — ABNORMAL LOW (ref 6.5–8.1)

## 2018-10-21 LAB — TROPONIN I: Troponin I: <0.03 ng/mL (ref <0.03)

## 2018-10-21 LAB — URINALYSIS, ROUTINE W REFLEX MICROSCOPIC
Bilirubin Urine: NEGATIVE
GLUCOSE, UA: NEGATIVE mg/dL
Hgb urine dipstick: NEGATIVE
Ketones, ur: NEGATIVE mg/dL
Nitrite: NEGATIVE
Protein, ur: NEGATIVE mg/dL
Specific Gravity, Urine: 1.014 (ref 1.005–1.030)
pH: 6 (ref 5.0–8.0)

## 2018-10-21 LAB — ECHOCARDIOGRAM COMPLETE
Height: 55 in
Weight: 2451.52 oz

## 2018-10-21 LAB — CBC
HCT: 34.9 % — ABNORMAL LOW (ref 36.0–46.0)
Hemoglobin: 11.2 g/dL — ABNORMAL LOW (ref 12.0–15.0)
MCH: 29.2 pg (ref 26.0–34.0)
MCHC: 32.1 g/dL (ref 30.0–36.0)
MCV: 91.1 fL (ref 80.0–100.0)
Platelets: 240 10*3/uL (ref 150–400)
RBC: 3.83 MIL/uL — ABNORMAL LOW (ref 3.87–5.11)
RDW: 15.6 % — ABNORMAL HIGH (ref 11.5–15.5)
WBC: 10.4 10*3/uL (ref 4.0–10.5)
nRBC: 0 % (ref 0.0–0.2)

## 2018-10-21 LAB — CBC WITH DIFFERENTIAL/PLATELET
Abs Immature Granulocytes: 0.09 10*3/uL — ABNORMAL HIGH (ref 0.00–0.07)
BASOS ABS: 0 10*3/uL (ref 0.0–0.1)
BASOS PCT: 0 %
EOS ABS: 0 10*3/uL (ref 0.0–0.5)
Eosinophils Relative: 0 %
HCT: 34.2 % — ABNORMAL LOW (ref 36.0–46.0)
Hemoglobin: 11.2 g/dL — ABNORMAL LOW (ref 12.0–15.0)
Immature Granulocytes: 1 %
LYMPHS PCT: 15 %
Lymphs Abs: 1.5 10*3/uL (ref 0.7–4.0)
MCH: 29 pg (ref 26.0–34.0)
MCHC: 32.7 g/dL (ref 30.0–36.0)
MCV: 88.6 fL (ref 80.0–100.0)
Monocytes Absolute: 0.9 10*3/uL (ref 0.1–1.0)
Monocytes Relative: 9 %
NEUTROS ABS: 7.5 10*3/uL (ref 1.7–7.7)
Neutrophils Relative %: 75 %
PLATELETS: 262 10*3/uL (ref 150–400)
RBC: 3.86 MIL/uL — AB (ref 3.87–5.11)
RDW: 15.3 % (ref 11.5–15.5)
WBC: 10 10*3/uL (ref 4.0–10.5)
nRBC: 0 % (ref 0.0–0.2)

## 2018-10-21 LAB — TYPE AND SCREEN
ABO/RH(D): O NEG
ABO/RH(D): O NEG
Antibody Screen: NEGATIVE
Antibody Screen: NEGATIVE

## 2018-10-21 LAB — PROTIME-INR
INR: 1.2 (ref 0.8–1.2)
Prothrombin Time: 14.9 seconds (ref 11.4–15.2)

## 2018-10-21 LAB — GLUCOSE, CAPILLARY
GLUCOSE-CAPILLARY: 76 mg/dL (ref 70–99)
GLUCOSE-CAPILLARY: 141 mg/dL — AB (ref 70–99)
GLUCOSE-CAPILLARY: 116 mg/dL — AB (ref 70–99)
Glucose-Capillary: 77 mg/dL (ref 70–99)
Glucose-Capillary: 105 mg/dL — ABNORMAL HIGH (ref 70–99)

## 2018-10-21 LAB — MRSA PCR SCREENING: MRSA by PCR: POSITIVE — AB

## 2018-10-21 SURGERY — PACEMAKER IMPLANT

## 2018-10-21 SURGERY — INSERTION, CARDIAC PACEMAKER
Anesthesia: Moderate Sedation | Laterality: Left

## 2018-10-21 MED ORDER — IPRATROPIUM-ALBUTEROL 0.5-2.5 (3) MG/3ML IN SOLN: 3.0000 mL | Freq: Four times a day (QID) | RESPIRATORY_TRACT | Status: DC

## 2018-10-21 MED ORDER — FOLIC ACID 1 MG PO TABS: 1.0000 mg | ORAL_TABLET | Freq: Every day | ORAL | Status: DC

## 2018-10-21 MED ORDER — ONDANSETRON HCL 4 MG/2ML IJ SOLN
4.0000 mg | Freq: Four times a day (QID) | INTRAMUSCULAR | Status: DC | PRN
  Administered 2018-10-21: 4 mg via INTRAVENOUS
  Filled 2018-10-21: qty 2

## 2018-10-21 MED ORDER — FENTANYL CITRATE (PF) 100 MCG/2ML IJ SOLN
INTRAMUSCULAR | Status: DC | PRN
  Administered 2018-10-21 (×2): 25 ug via INTRAVENOUS

## 2018-10-21 MED ORDER — SODIUM CHLORIDE 0.9 % IV SOLN: INTRAVENOUS | Status: DC

## 2018-10-21 MED ORDER — POTASSIUM CHLORIDE CRYS ER 20 MEQ PO TBCR: 20.0000 meq | EXTENDED_RELEASE_TABLET | Freq: Two times a day (BID) | ORAL | Status: DC

## 2018-10-21 MED ORDER — FENTANYL CITRATE (PF) 100 MCG/2ML IJ SOLN
25.0000 ug | INTRAMUSCULAR | Status: DC | PRN
  Administered 2018-10-22: 25 ug via INTRAVENOUS
  Filled 2018-10-21 (×2): qty 2

## 2018-10-21 MED ORDER — VANCOMYCIN HCL 1000 MG IV SOLR
1000.0000 mg | INTRAVENOUS | Status: AC
  Administered 2018-10-21: 1000 mg via INTRAVENOUS

## 2018-10-21 MED ORDER — IPRATROPIUM-ALBUTEROL 0.5-2.5 (3) MG/3ML IN SOLN
3.0000 mL | Freq: Three times a day (TID) | RESPIRATORY_TRACT | Status: DC
  Administered 2018-10-22: 3 mL via RESPIRATORY_TRACT
  Filled 2018-10-21: qty 3

## 2018-10-21 MED ORDER — MORPHINE SULFATE (PF) 2 MG/ML IV SOLN: 1.0000 mg | INTRAVENOUS | Status: DC | PRN

## 2018-10-21 MED ORDER — HYDROCODONE-ACETAMINOPHEN 5-325 MG PO TABS
1.0000 | ORAL_TABLET | ORAL | Status: DC | PRN
  Administered 2018-10-21: 1 via ORAL
  Filled 2018-10-21: qty 1

## 2018-10-21 MED ORDER — IPRATROPIUM-ALBUTEROL 20-100 MCG/ACT IN AERS: 2.0000 | INHALATION_SPRAY | Freq: Four times a day (QID) | RESPIRATORY_TRACT | Status: DC

## 2018-10-21 MED ORDER — HEPARIN (PORCINE) IN NACL 1000-0.9 UT/500ML-% IV SOLN
INTRAVENOUS | Status: AC
  Filled 2018-10-21: qty 500

## 2018-10-21 MED ORDER — PRAMIPEXOLE DIHYDROCHLORIDE 0.25 MG PO TABS
0.2500 mg | ORAL_TABLET | Freq: Every day | ORAL | Status: DC
  Filled 2018-10-21: qty 1

## 2018-10-21 MED ORDER — PREGABALIN 50 MG PO CAPS: 50.0000 mg | ORAL_CAPSULE | Freq: Two times a day (BID) | ORAL | Status: DC

## 2018-10-21 MED ORDER — CHLORHEXIDINE GLUCONATE 4 % EX LIQD: 60.0000 mL | Freq: Once | CUTANEOUS | Status: DC

## 2018-10-21 MED ORDER — MAGNESIUM HYDROXIDE 400 MG/5ML PO SUSP: 30.0000 mL | Freq: Every day | ORAL | Status: DC | PRN

## 2018-10-21 MED ORDER — ACETAMINOPHEN 325 MG PO TABS: 325.0000 mg | ORAL_TABLET | ORAL | Status: DC | PRN

## 2018-10-21 MED ORDER — POVIDONE-IODINE 10 % EX SWAB: 2.0000 "application " | Freq: Once | CUTANEOUS | Status: DC

## 2018-10-21 MED ORDER — ATROPINE SULFATE 1 MG/10ML IJ SOSY: 1.0000 mg | PREFILLED_SYRINGE | INTRAMUSCULAR | Status: DC | PRN

## 2018-10-21 MED ORDER — CHLORHEXIDINE GLUCONATE 4 % EX LIQD
CUTANEOUS | Status: AC
  Filled 2018-10-21: qty 45

## 2018-10-21 MED ORDER — MIDAZOLAM HCL 5 MG/5ML IJ SOLN
INTRAMUSCULAR | Status: DC | PRN
  Administered 2018-10-21 (×2): 1 mg via INTRAVENOUS

## 2018-10-21 MED ORDER — TRAZODONE HCL 50 MG PO TABS: 25.0000 mg | ORAL_TABLET | Freq: Every evening | ORAL | Status: DC | PRN

## 2018-10-21 MED ORDER — DOPAMINE-DEXTROSE 3.2-5 MG/ML-% IV SOLN
0.0000 ug/kg/min | INTRAVENOUS | Status: DC
  Administered 2018-10-21: 4 ug/kg/min via INTRAVENOUS

## 2018-10-21 MED ORDER — LIDOCAINE HCL 1 % IJ SOLN
INTRAMUSCULAR | Status: AC
  Filled 2018-10-21: qty 20

## 2018-10-21 MED ORDER — PANTOPRAZOLE SODIUM 40 MG PO TBEC
40.0000 mg | DELAYED_RELEASE_TABLET | Freq: Every day | ORAL | Status: DC
  Administered 2018-10-23 – 2018-10-24 (×2): 40 mg via ORAL
  Filled 2018-10-21 (×2): qty 1

## 2018-10-21 MED ORDER — ONDANSETRON HCL 4 MG/2ML IJ SOLN
INTRAMUSCULAR | Status: DC | PRN
  Administered 2018-10-21: 4 mg via INTRAVENOUS

## 2018-10-21 MED ORDER — FENTANYL CITRATE (PF) 100 MCG/2ML IJ SOLN
INTRAMUSCULAR | Status: AC
  Filled 2018-10-21: qty 2

## 2018-10-21 MED ORDER — TRANEXAMIC ACID-NACL 1000-0.7 MG/100ML-% IV SOLN
1000.0000 mg | INTRAVENOUS | Status: DC
  Filled 2018-10-21: qty 100

## 2018-10-21 MED ORDER — NITROFURANTOIN MONOHYD MACRO 100 MG PO CAPS
100.0000 mg | ORAL_CAPSULE | Freq: Two times a day (BID) | ORAL | Status: DC
  Filled 2018-10-21 (×2): qty 1

## 2018-10-21 MED ORDER — LIDOCAINE HCL (PF) 1 % IJ SOLN
INTRAMUSCULAR | Status: DC | PRN
  Administered 2018-10-21: 75 mL

## 2018-10-21 MED ORDER — MIDAZOLAM HCL 5 MG/5ML IJ SOLN
INTRAMUSCULAR | Status: AC
  Filled 2018-10-21: qty 5

## 2018-10-21 MED ORDER — HYDROCODONE-ACETAMINOPHEN 5-325 MG PO TABS: 1.0000 | ORAL_TABLET | Freq: Four times a day (QID) | ORAL | Status: DC | PRN

## 2018-10-21 MED ORDER — FLUTICASONE PROPIONATE 50 MCG/ACT NA SUSP
2.0000 | Freq: Two times a day (BID) | NASAL | Status: DC
  Filled 2018-10-21: qty 16

## 2018-10-21 MED ORDER — MORPHINE SULFATE (PF) 2 MG/ML IV SOLN
1.0000 mg | INTRAVENOUS | Status: DC | PRN
  Administered 2018-10-21 – 2018-10-22 (×3): 2 mg via INTRAVENOUS
  Filled 2018-10-21 (×4): qty 1

## 2018-10-21 MED ORDER — CEFAZOLIN SODIUM-DEXTROSE 1-4 GM/50ML-% IV SOLN
1.0000 g | INTRAVENOUS | Status: DC
  Filled 2018-10-21: qty 50

## 2018-10-21 MED ORDER — IPRATROPIUM-ALBUTEROL 0.5-2.5 (3) MG/3ML IN SOLN: 3.0000 mL | Freq: Four times a day (QID) | RESPIRATORY_TRACT | Status: DC | PRN

## 2018-10-21 MED ORDER — ESCITALOPRAM OXALATE 10 MG PO TABS
10.0000 mg | ORAL_TABLET | Freq: Every morning | ORAL | Status: DC
  Filled 2018-10-21: qty 1

## 2018-10-21 MED ORDER — ACETAMINOPHEN 650 MG RE SUPP: 650.0000 mg | Freq: Four times a day (QID) | RECTAL | Status: DC | PRN

## 2018-10-21 MED ORDER — VANCOMYCIN HCL 1000 MG IV SOLR
1000.0000 mg | Freq: Two times a day (BID) | INTRAVENOUS | Status: AC
  Administered 2018-10-22: 1000 mg via INTRAVENOUS
  Filled 2018-10-21 (×2): qty 1000

## 2018-10-21 MED ORDER — MIRTAZAPINE 15 MG PO TABS
7.5000 mg | ORAL_TABLET | Freq: Every day | ORAL | Status: AC
  Administered 2018-10-21: 7.5 mg via ORAL
  Filled 2018-10-21: qty 1

## 2018-10-21 MED ORDER — BUSPIRONE HCL 15 MG PO TABS
7.5000 mg | ORAL_TABLET | Freq: Two times a day (BID) | ORAL | Status: DC
  Filled 2018-10-21 (×2): qty 1

## 2018-10-21 MED ORDER — MORPHINE SULFATE (PF) 2 MG/ML IV SOLN
1.0000 mg | INTRAVENOUS | Status: DC | PRN
  Administered 2018-10-21 (×3): 1 mg via INTRAVENOUS
  Filled 2018-10-21 (×2): qty 1

## 2018-10-21 MED ORDER — CLONAZEPAM 0.125 MG PO TBDP: 0.1250 mg | ORAL_TABLET | Freq: Two times a day (BID) | ORAL | Status: DC

## 2018-10-21 MED ORDER — LISINOPRIL 5 MG PO TABS: 2.5000 mg | ORAL_TABLET | Freq: Two times a day (BID) | ORAL | Status: DC

## 2018-10-21 MED ORDER — SODIUM CHLORIDE 0.9 % IV SOLN
INTRAVENOUS | Status: AC
  Filled 2018-10-21: qty 2

## 2018-10-21 MED ORDER — SODIUM CHLORIDE 0.9 % IV SOLN
INTRAVENOUS | Status: AC
  Administered 2018-10-21: 22:00:00 via INTRAVENOUS

## 2018-10-21 MED ORDER — VITAMIN B-12 100 MCG PO TABS: 500.0000 ug | ORAL_TABLET | Freq: Every day | ORAL | Status: DC

## 2018-10-21 MED ORDER — CLONAZEPAM 0.5 MG PO TBDP
0.5000 mg | ORAL_TABLET | Freq: Every day | ORAL | Status: AC
  Administered 2018-10-21: 0.5 mg via ORAL
  Filled 2018-10-21: qty 1

## 2018-10-21 MED ORDER — ONDANSETRON HCL 4 MG/2ML IJ SOLN
4.0000 mg | Freq: Once | INTRAMUSCULAR | Status: AC
  Administered 2018-10-21: 4 mg via INTRAVENOUS
  Filled 2018-10-21: qty 2

## 2018-10-21 MED ORDER — TOPIRAMATE 25 MG PO TABS
25.0000 mg | ORAL_TABLET | Freq: Every day | ORAL | Status: DC
  Filled 2018-10-21: qty 1

## 2018-10-21 MED ORDER — HEPARIN (PORCINE) IN NACL 2-0.9 UNITS/ML
INTRAMUSCULAR | Status: AC | PRN
  Administered 2018-10-21: 500 mL

## 2018-10-21 MED ORDER — ONDANSETRON HCL 4 MG PO TABS: 4.0000 mg | ORAL_TABLET | Freq: Four times a day (QID) | ORAL | Status: DC | PRN

## 2018-10-21 MED ORDER — SODIUM CHLORIDE 0.45 % IV SOLN
INTRAVENOUS | Status: DC
  Administered 2018-10-21: 22:00:00 via INTRAVENOUS
  Filled 2018-10-21: qty 1000

## 2018-10-21 MED ORDER — PANTOPRAZOLE SODIUM 40 MG PO TBEC: 40.0000 mg | DELAYED_RELEASE_TABLET | Freq: Every day | ORAL | Status: DC

## 2018-10-21 MED ORDER — IPRATROPIUM-ALBUTEROL 0.5-2.5 (3) MG/3ML IN SOLN
3.0000 mL | Freq: Four times a day (QID) | RESPIRATORY_TRACT | Status: DC
  Administered 2018-10-21: 3 mL via RESPIRATORY_TRACT
  Filled 2018-10-21: qty 3

## 2018-10-21 MED ORDER — VITAMIN D 25 MCG (1000 UNIT) PO TABS: 5000.0000 [IU] | ORAL_TABLET | ORAL | Status: DC

## 2018-10-21 MED ORDER — MORPHINE SULFATE (PF) 2 MG/ML IV SOLN: 2.0000 mg | INTRAVENOUS | 0 refills | Status: DC | PRN

## 2018-10-21 MED ORDER — IPRATROPIUM-ALBUTEROL 0.5-2.5 (3) MG/3ML IN SOLN
3.0000 mL | Freq: Four times a day (QID) | RESPIRATORY_TRACT | Status: DC
  Administered 2018-10-21: 3 mL via RESPIRATORY_TRACT
  Filled 2018-10-21 (×2): qty 3

## 2018-10-21 MED ORDER — METHOCARBAMOL 500 MG PO TABS: 500.0000 mg | ORAL_TABLET | Freq: Four times a day (QID) | ORAL | Status: DC | PRN

## 2018-10-21 MED ORDER — DOPAMINE-DEXTROSE 3.2-5 MG/ML-% IV SOLN: 0.0000 ug/kg/min | INTRAVENOUS | 0 refills | Status: DC

## 2018-10-21 MED ORDER — OXYCODONE-ACETAMINOPHEN 5-325 MG PO TABS: 1.0000 | ORAL_TABLET | ORAL | Status: DC | PRN

## 2018-10-21 MED ORDER — DOCUSATE SODIUM 100 MG PO CAPS: 100.0000 mg | ORAL_CAPSULE | Freq: Two times a day (BID) | ORAL | Status: DC

## 2018-10-21 MED ORDER — ONDANSETRON HCL 4 MG/2ML IJ SOLN
INTRAMUSCULAR | Status: AC
  Filled 2018-10-21: qty 2

## 2018-10-21 MED ORDER — DOPAMINE-DEXTROSE 3.2-5 MG/ML-% IV SOLN
INTRAVENOUS | Status: AC
  Administered 2018-10-21: 4 ug/kg/min via INTRAVENOUS
  Filled 2018-10-21: qty 250

## 2018-10-21 MED ORDER — LAMOTRIGINE 25 MG PO TABS: 100.0000 mg | ORAL_TABLET | Freq: Every day | ORAL | Status: DC

## 2018-10-21 MED ORDER — INSULIN ASPART 100 UNIT/ML ~~LOC~~ SOLN: 0.0000 [IU] | SUBCUTANEOUS | Status: DC

## 2018-10-21 MED ORDER — LORATADINE 10 MG PO TABS: 10.0000 mg | ORAL_TABLET | Freq: Every day | ORAL | Status: DC

## 2018-10-21 MED ORDER — ALUM & MAG HYDROXIDE-SIMETH 200-200-20 MG/5ML PO SUSP: 30.0000 mL | Freq: Four times a day (QID) | ORAL | Status: DC | PRN

## 2018-10-21 MED ORDER — CEFAZOLIN SODIUM-DEXTROSE 2-4 GM/100ML-% IV SOLN
2.0000 g | INTRAVENOUS | Status: AC
  Administered 2018-10-22: 2 g via INTRAVENOUS
  Filled 2018-10-21: qty 100

## 2018-10-21 MED ORDER — METHOTREXATE 2.5 MG PO TABS: 7.5000 mg | ORAL_TABLET | ORAL | Status: DC

## 2018-10-21 MED ORDER — MIRTAZAPINE 15 MG PO TABS: 7.5000 mg | ORAL_TABLET | Freq: Every day | ORAL | Status: DC

## 2018-10-21 MED ORDER — CEFAZOLIN SODIUM-DEXTROSE 2-4 GM/100ML-% IV SOLN: 2.0000 g | INTRAVENOUS | Status: DC

## 2018-10-21 MED ORDER — CHLORHEXIDINE GLUCONATE 4 % EX LIQD
60.0000 mL | Freq: Once | CUTANEOUS | Status: AC
  Administered 2018-10-21: 4 via TOPICAL
  Filled 2018-10-21: qty 15

## 2018-10-21 MED ORDER — SODIUM CHLORIDE 0.9 % IV SOLN
INTRAVENOUS | Status: DC
  Administered 2018-10-21: 03:00:00 via INTRAVENOUS

## 2018-10-21 MED ORDER — FENTANYL CITRATE (PF) 100 MCG/2ML IJ SOLN
INTRAMUSCULAR | Status: DC | PRN
  Administered 2018-10-21: 25 ug via INTRAVENOUS

## 2018-10-21 MED ORDER — SODIUM CHLORIDE 0.9 % IV SOLN: 80.0000 mg | INTRAVENOUS | Status: DC

## 2018-10-21 MED ORDER — ACETAMINOPHEN 325 MG PO TABS: 650.0000 mg | ORAL_TABLET | Freq: Four times a day (QID) | ORAL | Status: DC | PRN

## 2018-10-21 MED ORDER — VANCOMYCIN HCL IN DEXTROSE 1-5 GM/200ML-% IV SOLN
INTRAVENOUS | Status: AC
  Filled 2018-10-21: qty 200

## 2018-10-21 MED ORDER — POLYETHYLENE GLYCOL 3350 17 G PO PACK: 17.0000 g | PACK | Freq: Every day | ORAL | Status: DC | PRN

## 2018-10-21 MED ORDER — METHOCARBAMOL 1000 MG/10ML IJ SOLN
500.0000 mg | Freq: Four times a day (QID) | INTRAVENOUS | Status: DC | PRN
  Filled 2018-10-21: qty 5

## 2018-10-21 MED ORDER — INSULIN ASPART 100 UNIT/ML ~~LOC~~ SOLN
0.0000 [IU] | SUBCUTANEOUS | Status: DC
  Administered 2018-10-21: 1 [IU] via SUBCUTANEOUS

## 2018-10-21 MED ORDER — ATROPINE SULFATE 1 MG/10ML IJ SOSY
PREFILLED_SYRINGE | INTRAMUSCULAR | Status: AC
  Filled 2018-10-21: qty 10

## 2018-10-21 MED ORDER — MONTELUKAST SODIUM 10 MG PO TABS: 10.0000 mg | ORAL_TABLET | Freq: Every day | ORAL | Status: DC

## 2018-10-21 MED ORDER — ALBUTEROL SULFATE (2.5 MG/3ML) 0.083% IN NEBU
2.5000 mg | INHALATION_SOLUTION | Freq: Once | RESPIRATORY_TRACT | Status: AC
  Administered 2018-10-21: 2.5 mg via RESPIRATORY_TRACT
  Filled 2018-10-21: qty 3

## 2018-10-21 MED ORDER — CLINDAMYCIN PHOSPHATE 600 MG/50ML IV SOLN
600.0000 mg | INTRAVENOUS | Status: DC
  Filled 2018-10-21: qty 50

## 2018-10-21 SURGICAL SUPPLY — 10 items
CABLE SURGICAL S-101-97-12 (CABLE) ×6 IMPLANT
HEMOSTAT SURGICEL 2X4 FIBR (HEMOSTASIS) ×3 IMPLANT
HOVERMATT SINGLE USE (MISCELLANEOUS) ×3 IMPLANT
KIT MICROPUNCTURE NIT STIFF (SHEATH) ×3 IMPLANT
LEAD CAPSURE NOVUS 45CM (Lead) ×3 IMPLANT
LEAD CAPSURE NOVUS 5076-52CM (Lead) ×3 IMPLANT
PACEMAKER ASSURITY DR-RF (Pacemaker) ×3 IMPLANT
PAD PRO RADIOLUCENT 2001M-C (PAD) ×3 IMPLANT
SHEATH CLASSIC 7F (SHEATH) ×6 IMPLANT
TRAY PACEMAKER INSERTION (PACKS) ×3 IMPLANT

## 2018-10-21 NOTE — ED Notes (Signed)
ED TO INPATIENT HANDOFF REPORT  ED Nurse Name and Phone #:  Jerald Kief, RN  [phone]  S Name/Age/Gender Meghan [name] 73 y.o. female Room/Bed: ED07A/ED07A  Code Status   Code Status: Prior  Home/SNF/Other Nursing Home Patient oriented to: self, time, place, situation. Is this baseline? Yes   Triage Complete: Triage complete  Chief Complaint Fall  Triage Note Pt BIB EMS from [facility] care center  for reported positive femur fx to right leg from outpatient XR. Pt reports a mechanical fall yesterday, she did hit her head, denies any blood thinners. Alert and oriented in triage. HR sinus brady with a rate of 42.   Allergies Allergies  Allergen Reactions  . Amoxicillin Itching and Other (See Comments)    Has patient had a PCN reaction causing immediate rash, facial/tongue/throat swelling, SOB or lightheadedness with hypotension: Unknown Has patient had a PCN reaction causing severe rash involving mucus membranes or skin necrosis: Unknown Has patient had a PCN reaction that required hospitalization: Unknown Has patient had a PCN reaction occurring within the last 10 years: Unknown If all of the above answers are "NO", then may proceed with Cephalosporin use.   . Gabapentin Other (See Comments)    Pt states that it causes her BP to drop.   . Naproxen Hives  . Nsaids Other (See Comments)    Reaction:  Unknown     Level of Care/Admitting Diagnosis ED Disposition    ED Disposition Condition [facility] Area: [place] [100120]  Level of Care: ICU [6]  Diagnosis: Complete heart block [facility]) [[id number]]  Admitting Physician: Christel Mormon [[id number]]  Attending Physician: Christel Mormon [[id number]]  Estimated length of stay: 3 - 4 days  Certification:: I certify this patient will need inpatient services for at least 2 midnights  PT Class (Do Not Modify): Inpatient [101]  PT Acc Code (Do Not Modify): Private [1]        B Medical/Surgery History Past Medical History:  Diagnosis Date  . Anginal pain ([place])   . Anxiety   . Arthritis    RA  . Asthma   . Brain tumor (benign) ([place])   . CHF (congestive heart failure) ([place])   . Collagen vascular disease ([place])   . COPD (chronic obstructive pulmonary disease) ([place])   . Coronary artery disease   . DDD (degenerative disc disease)   . Depression   . Diabetes mellitus   . GERD (gastroesophageal reflux disease)   . Headache   . Heart murmur   . Heart murmur   . Hypercholesteremia   . Hypertension   . Lumbar degenerative disc disease   . Migraines   . Obesity   . Osteopenia   . Osteoporosis   . Pneumonia   . PTSD (post-traumatic stress disorder)    Past Surgical History:  Procedure Laterality Date  . ABDOMINAL HYSTERECTOMY    . CERVICAL FUSION    . CHOLECYSTECTOMY    . COLONOSCOPY WITH PROPOFOL N/A 09/20/2015   Procedure: COLONOSCOPY WITH PROPOFOL;  Surgeon: Josefine Class, MD;  Location: [facility] ENDOSCOPY;  Service: Endoscopy;  Laterality: N/A;  . COLONOSCOPY WITH PROPOFOL N/A 01/27/2016   Procedure: COLONOSCOPY WITH PROPOFOL;  Surgeon: Manya Silvas, MD;  Location: [facility] ENDOSCOPY;  Service: Endoscopy;  Laterality: N/A;  . FINGER ARTHROPLASTY  02/23/2012   Procedure: FINGER ARTHROPLASTY;  Surgeon: Cammie Sickle., MD;  Location: [place];  Service: Orthopedics;  Laterality: Left;  Extensor carpi radialis longus to Extensor carpi ulnaris transfer, left Metaphalangeal reconstructions of index and long fingers,  . FOOT ARTHROPLASTY     toes x2 rt foot  . HAND RECONSTRUCTION  2011   right-multiple finger joint reconst  . JOINT REPLACEMENT     bilat knee replacements  . KYPHOPLASTY N/A 06/14/2017   Procedure: KYPHOPLASTY L1;  Surgeon: Hessie Knows, MD;  Location: [facility] ORS;  Service: Orthopedics;  Laterality: N/A;  . LOOP RECORDER INSERTION N/A 06/23/2017   Procedure: LOOP RECORDER INSERTION;  Surgeon: Isaias Cowman,  MD;  Location: [place] CV LAB;  Service: Cardiovascular;  Laterality: N/A;     A IV Location/Drains/Wounds Patient Lines/Drains/Airways Status   Active Line/Drains/Airways    Name:   Placement date:   Placement time:   Site:   Days:   Peripheral IV 10/20/18 Left Forearm   10/20/18    2249    Forearm   1   External Urinary Catheter   10/21/18    0057    -   less than 1   Incision (Closed) 06/14/17 Back   06/14/17    2205     494   Incision (Closed) 06/14/17 Sacrum   06/14/17    2218     494   Pressure Injury 03/21/17 Stage II -  Partial thickness loss of dermis presenting as a shallow open ulcer with a red, pink wound bed without slough. Small area, approx. the size of the tip of a pinky finger. Open skin area under where the pink foam pad is   03/21/17    2000     579   Pressure Injury 08/26/17 Stage I -  Intact skin with non-blanchable redness of a localized area usually over a bony prominence. REDDENED WITH BLANCHABLE   08/26/17    0624     421          Intake/Output Last 24 hours  Intake/Output Summary (Last 24 hours) at 10/21/2018 0333 Last data filed at 10/20/2018 2304 Gross per 24 hour  Intake 300 ml  Output -  Net 300 ml    Labs/Imaging Results for orders placed or performed during the hospital encounter of 10/20/18 (from the past 48 hour(s))  CBC with Differential/Platelet     Status: Abnormal   Collection Time: 10/21/18  1:00 AM  Result Value Ref Range   WBC 10.0 4.0 - 10.5 K/uL   RBC 3.86 (L) 3.87 - 5.11 MIL/uL   Hemoglobin 11.2 (L) 12.0 - 15.0 g/dL   HCT 34.2 (L) 36.0 - 46.0 %   MCV 88.6 80.0 - 100.0 fL   MCH 29.0 26.0 - 34.0 pg   MCHC 32.7 30.0 - 36.0 g/dL   RDW 15.3 11.5 - 15.5 %   Platelets 262 150 - 400 K/uL   nRBC 0.0 0.0 - 0.2 %   Neutrophils Relative % 75 %   Neutro Abs 7.5 1.7 - 7.7 K/uL   Lymphocytes Relative 15 %   Lymphs Abs 1.5 0.7 - 4.0 K/uL   Monocytes Relative 9 %   Monocytes Absolute 0.9 0.1 - 1.0 K/uL   Eosinophils Relative 0 %    Eosinophils Absolute 0.0 0.0 - 0.5 K/uL   Basophils Relative 0 %   Basophils Absolute 0.0 0.0 - 0.1 K/uL   Immature Granulocytes 1 %   Abs Immature Granulocytes 0.09 (H) 0.00 - 0.07 K/uL    Comment: Performed at [facility], [address], [place], [place] [address]  Comprehensive  metabolic panel     Status: Abnormal   Collection Time: 10/21/18  1:00 AM  Result Value Ref Range   Sodium 140 135 - 145 mmol/L   Potassium 3.7 3.5 - 5.1 mmol/L   Chloride 108 98 - 111 mmol/L   CO2 22 22 - 32 mmol/L   Glucose, Bld 91 70 - 99 mg/dL   BUN 15 8 - 23 mg/dL   Creatinine, Ser 0.81 0.44 - 1.00 mg/dL   Calcium 7.6 (L) 8.9 - 10.3 mg/dL   Total Protein 5.9 (L) 6.5 - 8.1 g/dL   Albumin 3.2 (L) 3.5 - 5.0 g/dL   AST 32 15 - 41 U/L   ALT 34 0 - 44 U/L   Alkaline Phosphatase 62 38 - 126 U/L   Total Bilirubin 1.3 (H) 0.3 - 1.2 mg/dL   GFR calc non Af Amer >60 >60 mL/min   GFR calc Af Amer >60 >60 mL/min   Anion gap 10 5 - 15    Comment: Performed at [facility], [address] [place], [place] [address]  Type and screen [place]     Status: None   Collection Time: 10/21/18  1:00 AM  Result Value Ref Range   ABO/RH(D) O NEG    Antibody Screen NEG    Sample Expiration      10/24/2018 Performed at [facility], [address] [place], [place] [address]   Troponin I - ONCE - STAT     Status: None   Collection Time: 10/21/18  1:00 AM  Result Value Ref Range   Troponin I <0.03 <0.03 ng/mL    Comment: Performed at [facility], [address] [place], [place] [address]   Ct Head Wo Contrast  Result Date: 10/20/2018 CLINICAL DATA:  Mechanical fall yesterday hitting head. EXAM: CT HEAD WITHOUT CONTRAST TECHNIQUE: Contiguous axial images were obtained from the base of the skull through the vertex without intravenous contrast. COMPARISON:  04/09/2017 FINDINGS: Brain: Right cerebellar encephalomalacia status post suboccipital craniectomy.  Mild age related involutional changes of brain appear stable otherwise. Chronic microvascular ischemic disease of periventricular white matter with chronic right basal ganglial lacunar infarcts are noted. Midline fourth ventricle basal cisterns without effacement. No intra-axial mass nor extra-axial collection. Vascular: Atherosclerosis of the carotid siphons. Skull: No acute skull fracture.  Suboccipital craniectomy on right. Sinuses/Orbits: Intact orbits and globes. Mild ethmoid sinus mucosal thickening. Frothy mucous in the left sphenoid. Maxillary sinuses are clear to the extent included. Other: No significant soft tissue swelling. IMPRESSION: 1. No acute intracranial abnormality. 2. Chronic moderate small vessel ischemia and right basal ganglial lacunar infarct. 3. Status post right suboccipital craniectomy with right cerebellar encephalomalacia. Electronically Signed   By: Ashley Royalty M.D.   On: 10/20/2018 23:56   Dg Chest Portable 1 View  Result Date: 10/21/2018 CLINICAL DATA:  Patient fell.  Evaluate for fracture. EXAM: PORTABLE CHEST 1 VIEW COMPARISON:  08/26/2017 FINDINGS: AP supine view of the chest. 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 The patient is status post L1 kyphoplasty. Thoracic spondylosis with gentle dextroconvex curvature of the thoracic spine is again noted without change. Multilevel degenerative disc disease with endplate spurring is noted. Loop recording device projects over the left hemithorax. Stable borderline cardiomegaly. No acute pulmonary consolidation or overt pulmonary edema. Aortic atherosclerosis without aneurysm. No acute osseous appearing abnormality. IMPRESSION: Chronic elevation and/or eventration of the right hemidiaphragm. Stable borderline cardiomegaly  with aortic atherosclerosis. Electronically Signed   By: Ashley Royalty M.D.   On: 10/21/2018 00:37   Dg Femur Min 2 Views Right  Result Date:  10/21/2018 CLINICAL DATA:  Mechanical fall yesterday. EXAM: RIGHT FEMUR 2 VIEWS COMPARISON:  None. FINDINGS: Right total knee arthroplasty. There is a oblique periprosthetic fracture of the distal femoral metaphysis. Medial displacement and overriding of the distal fracture fragments is present. Proximal femur and hip appear intact. Vascular calcifications. IMPRESSION: Oblique periprosthetic fracture of the distal right femoral metaphysis. Right total knee arthroplasty. Electronically Signed   By: Lucienne Capers M.D.   On: 10/21/2018 00:39    Pending Labs Unresulted Labs (From admission, onward)    Start     Ordered   10/21/18 0056  Protime-INR  ONCE - STAT,   STAT     10/21/18 0059   10/21/18 0056  Urinalysis, Routine w reflex microscopic  ONCE - STAT,   STAT     10/21/18 0059   Signed and Held  Comprehensive metabolic panel  Tomorrow morning,   R     Signed and Held   Signed and Held  CBC  Tomorrow morning,   R     Signed and Held          Vitals/Pain Today's Vitals   10/21/18 0146 10/21/18 0155 10/21/18 0200 10/21/18 0257  BP:  (!) 110/41 101/71   Pulse:  (!) 35 (!) 38   Resp:  18 14   Temp:      TempSrc:      SpO2:  97% 97%   Weight:      Height:      PainSc: 10-Worst pain ever   8     Isolation Precautions No active isolations  Medications Medications  fentaNYL (SUBLIMAZE) injection 50 mcg (50 mcg Intravenous Given 10/21/18 0215)  0.9 %  sodium chloride infusion ( Intravenous New Bag/Given 10/21/18 0242)  ceFAZolin (ANCEF) IVPB 1 g/50 mL premix (has no administration in time range)  clindamycin (CLEOCIN) IVPB 600 mg (has no administration in time range)  morphine 2 MG/ML injection 1 mg (1 mg Intravenous Given 10/21/18 0123)  tranexamic acid (CYKLOKAPRON) IVPB 1,000 mg (1,000 mg Intravenous Not Given 10/21/18 0203)  ondansetron (ZOFRAN) injection 4 mg (4 mg Intravenous Given 10/21/18 0121)  albuterol (PROVENTIL) (2.5 MG/3ML) 0.083% nebulizer solution 2.5 mg (2.5 mg  Nebulization Given 10/21/18 0126)    Mobility Pt here for Right femur fx, not ambulatory.  Low fall risk   Focused Assessments Cardiac Assessment Handoff:  Cardiac Rhythm: Sinus bradycardia Lab Results  Component Value Date   CKTOTAL 186 06/10/2017   CKMB 2.6 11/29/2012   TROPONINI <0.03 10/21/2018   No results found for: DDIMER Does the Patient currently have chest pain? No     R Recommendations: See Admitting Provider Note  Report given to:   Additional Notes:

## 2018-10-21 NOTE — Consult Note (Signed)
[facility] Consultation Note  Patient ID: Meghan [name], MRN: [id number], DOB/AGE: 1946/05/19 73 y.o. Admit date: 10/20/2018   Date of Consult: 10/21/2018 Primary Physician: Donato Schultz, MD Primary Cardiologist: [facility]  Chief Complaint:  Chief Complaint  Patient presents with  . Leg Injury  . Fall   Reason for Consult: Syncope with heart block  HPI: 73 y.o. female with known coronary atherosclerosis and aortic atherosclerosis with essential hypertension mixed hyperlipidemia and chronic obstructive pulmonary disease who has been doing relatively well but had a syncopal episode last year with some monitoring showing no evidence of issues.  The patient also has had an echocardiogram in 2018 showing normal LV systolic function but no evidence of significant aortic valve disease.  The patient has recently had some episodes of syncope in the last several days for which she has had unexpected dizziness and true syncope.  The patient then had a syncopal episode where she hurt her self and ended up with a femur fracture.  When seen in the emergency room the patient had a femur fracture a chest x-ray which showed no evidence of new changes no evidence of heart failure and or myocardial infarction.  The patient had telemetry showing complete heart block with controlled junctional rate at 40 bpm.  She hemodynamically is stable at this time with no evidence of symptoms other than current femur fracture.  We have discussed at length the concerns for complete heart block causing her syncopal episode and the need for permanent pacemaker placement.  She understands all the risks and benefits of permanent pacemaker placement include the possibility of death stroke heart attack infection bleeding blood clot hemopericardium and pneumothorax.  The patient is at low risk for moderate sedation.  We have also suggested that patient have her complete heart block remedied prior to her femur fracture if able  for further more consistent treatment.  She understands all the risks and benefits of above  Past Medical History:  Diagnosis Date  . Anginal pain ([place])   . Anxiety   . Arthritis    RA  . Asthma   . Brain tumor (benign) ([place])   . CHF (congestive heart failure) ([place])   . Collagen vascular disease ([place])   . COPD (chronic obstructive pulmonary disease) ([place])   . Coronary artery disease   . DDD (degenerative disc disease)   . Depression   . Diabetes mellitus   . GERD (gastroesophageal reflux disease)   . Headache   . Heart murmur   . Heart murmur   . Hypercholesteremia   . Hypertension   . Lumbar degenerative disc disease   . Migraines   . Obesity   . Osteopenia   . Osteoporosis   . Pneumonia   . PTSD (post-traumatic stress disorder)       Surgical History:  Past Surgical History:  Procedure Laterality Date  . ABDOMINAL HYSTERECTOMY    . CERVICAL FUSION    . CHOLECYSTECTOMY    . COLONOSCOPY WITH PROPOFOL N/A 09/20/2015   Procedure: COLONOSCOPY WITH PROPOFOL;  Surgeon: Josefine Class, MD;  Location: [facility] ENDOSCOPY;  Service: Endoscopy;  Laterality: N/A;  . COLONOSCOPY WITH PROPOFOL N/A 01/27/2016   Procedure: COLONOSCOPY WITH PROPOFOL;  Surgeon: Manya Silvas, MD;  Location: [facility] ENDOSCOPY;  Service: Endoscopy;  Laterality: N/A;  . FINGER ARTHROPLASTY  02/23/2012   Procedure: FINGER ARTHROPLASTY;  Surgeon: Cammie Sickle., MD;  Location: [facility];  Service: Orthopedics;  Laterality: Left;  Extensor carpi radialis longus to Extensor carpi ulnaris transfer, left Metaphalangeal reconstructions of index and long fingers,  . FOOT ARTHROPLASTY     toes x2 rt foot  . HAND RECONSTRUCTION  2011   right-multiple finger joint reconst  . JOINT REPLACEMENT     bilat knee replacements  . KYPHOPLASTY N/A 06/14/2017   Procedure: KYPHOPLASTY L1;  Surgeon: Hessie Knows, MD;  Location: [facility] ORS;  Service: Orthopedics;  Laterality: N/A;  . LOOP RECORDER INSERTION  N/A 06/23/2017   Procedure: LOOP RECORDER INSERTION;  Surgeon: Isaias Cowman, MD;  Location: [facility];  Service: Cardiovascular;  Laterality: N/A;     Home Meds: Prior to Admission medications   Medication Sig Start Date End Date Taking? Authorizing Provider  acetaminophen (TYLENOL) 325 MG tablet Take 650 mg by mouth every 6 (six) hours as needed.   Yes [provider]  busPIRone (BUSPAR) 7.5 MG tablet Take 1 tablet by mouth 2 (two) times daily.   Yes [provider]  Cholecalciferol 5000 units TABS Take 5,000 Units by mouth every 30 (thirty) days.   Yes [provider]  clonazePAM (KLONOPIN) 0.5 MG tablet Take 0.125 mg by mouth 2 (two) times daily.   Yes [provider]  cyanocobalamin 500 MCG tablet Take 500 mcg by mouth daily.   Yes [provider]  diclofenac sodium (VOLTAREN) 1 % GEL Apply 2 g topically 3 (three) times daily. Apply to the fingers   Yes [provider]  diphenoxylate-atropine (LOMOTIL) 2.5-0.025 MG tablet Take 1 tablet by mouth every 6 (six) hours as needed for diarrhea or loose stools.    Yes [provider]  escitalopram (LEXAPRO) 10 MG tablet Take 1 tablet (10 mg total) by mouth every morning. Patient taking differently: Take 20 mg by mouth every morning.  09/11/16  Yes Rainey Pines, MD  fluticasone (VERAMYST) 27.5 MCG/SPRAY nasal spray Place 2 sprays into the nose 2 (two) times daily.   Yes [provider]  folic acid (FOLVITE) 1 MG tablet Take 1 mg by mouth daily.   Yes [provider]  furosemide (LASIX) 40 MG tablet Take 40 mg by mouth 2 (two) times daily.   Yes [provider]  Ipratropium-Albuterol (COMBIVENT RESPIMAT) 20-100 MCG/ACT AERS respimat Inhale 2 puffs into the lungs every 6 (six) hours as needed for wheezing.   Yes [provider]  Ipratropium-Albuterol (COMBIVENT RESPIMAT) 20-100 MCG/ACT AERS respimat Inhale 2 puffs into the lungs every 6  (six) hours.   Yes [provider]  lamoTRIgine (LAMICTAL) 100 MG tablet Take 1 tablet (100 mg total) by mouth daily. 06/02/16  Yes Rainey Pines, MD  lisinopril (PRINIVIL,ZESTRIL) 2.5 MG tablet Take 1 tablet by mouth 2 (two) times daily.   Yes [provider]  loperamide (IMODIUM A-D) 2 MG tablet Take 1 tablet (2 mg total) by mouth 4 (four) times daily as needed for diarrhea or loose stools. Patient taking differently: Take 2 mg by mouth as needed for diarrhea or loose stools.  05/10/17  Yes Eula Listen, MD  loratadine (CLARITIN) 10 MG tablet Take 10 mg by mouth daily.   Yes [provider]  metoprolol tartrate (LOPRESSOR) 25 MG tablet Take 25 mg by mouth 2 (two) times daily.   Yes [provider]  mirtazapine (REMERON) 7.5 MG tablet Take 7.5 mg by mouth at bedtime.    Yes [provider]  montelukast (SINGULAIR) 10 MG tablet Take 10 mg by mouth daily.   Yes [provider]  omeprazole (PRILOSEC) 20 MG capsule Take 20 mg by mouth daily.    Yes [provider]  oxyCODONE-acetaminophen (PERCOCET/ROXICET) 5-325 MG tablet Take 1 tablet every 4 (four) hours as needed by mouth for moderate pain or severe pain. 06/15/17  Yes Henreitta Leber, MD  Potassium Chloride ER 20 MEQ TBCR Take 20 mEq by mouth 2 (two) times daily. 05/07/17  Yes Vaughan Basta, MD  pramipexole (MIRAPEX) 0.25 MG tablet Take 0.25 mg by mouth daily.    Yes [provider]  predniSONE (DELTASONE) 5 MG tablet Take 5 mg by mouth daily with breakfast.   Yes [provider]  pregabalin (LYRICA) 50 MG capsule TAKE ONE CAPSULE BY MOUTH TWICE A DAY 06/18/15  Yes [provider]  saxagliptin HCl (ONGLYZA) 5 MG TABS tablet Take 5 mg by mouth at bedtime.   Yes [provider]  Tofacitinib Citrate 5 MG TABS Take 5 mg by mouth 2 (two) times daily after a meal.    Yes [provider]  topiramate (TOPAMAX) 25 MG tablet Take 25 mg  by mouth at bedtime.    Yes [provider]  cefUROXime (CEFTIN) 500 MG tablet Take 1 tablet (500 mg total) by mouth 2 (two) times daily with a meal. Patient not taking: Reported on 10/21/2018 08/28/17   Bettey Costa, MD  methotrexate (RHEUMATREX) 7.5 MG tablet Take 7.5 mg by mouth once a week. Caution" Chemotherapy. Protect from light.    [provider]  promethazine (PHENERGAN) 25 MG suppository Place 1 suppository (25 mg total) rectally every 6 (six) hours as needed for nausea. 05/10/17 05/10/18  Eula Listen, MD    Inpatient Medications:  . busPIRone  7.5 mg Oral BID  . cholecalciferol  5,000 Units Oral Q30 days  . clonazepam  0.125 mg Oral BID  . docusate sodium  100 mg Oral BID  . escitalopram  10 mg Oral q morning - 10a  . fluticasone  2 spray Each Nare BID  . folic acid  1 mg Oral Daily  . ipratropium-albuterol  3 mL Inhalation Q6H  . lamoTRIgine  100 mg Oral Daily  . loratadine  10 mg Oral Daily  . mirtazapine  7.5 mg Oral QHS  . montelukast  10 mg Oral Daily  . nitrofurantoin (macrocrystal-monohydrate)  100 mg Oral Q12H  . pantoprazole  40 mg Oral Daily  . potassium chloride SA  20 mEq Oral BID  . pramipexole  0.25 mg Oral Daily  . pregabalin  50 mg Oral BID  . topiramate  25 mg Oral QHS  . vitamin B-12  500 mcg Oral Daily   . sodium chloride 75 mL/hr at 10/21/18 0600  .  ceFAZolin (ANCEF) IV    . clindamycin (CLEOCIN) IV    . tranexamic acid      Allergies:  Allergies  Allergen Reactions  . Amoxicillin Itching and Other (See Comments)    Has patient had a PCN reaction causing immediate rash, facial/tongue/throat swelling, SOB or lightheadedness with hypotension: Unknown Has patient had a PCN reaction causing severe rash involving mucus membranes or skin necrosis: Unknown Has patient had a PCN reaction that required hospitalization: Unknown Has patient had a PCN reaction occurring within the last 10 years: Unknown If all of the above answers  are "NO", then may proceed with Cephalosporin use.   . Gabapentin Other (See Comments)    Pt states that it causes her BP to drop.   . Naproxen Hives  .  Nsaids Other (See Comments)    Reaction:  Unknown     Social History   Socioeconomic History  . Marital status: Divorced    Spouse name: Not on file  . Number of children: Not on file  . Years of education: Not on file  . Highest education level: Not on file  Occupational History  . Occupation: retired  Scientific laboratory technician  . Financial resource strain: Not on file  . Food insecurity:    Worry: Not on file    Inability: Not on file  . Transportation needs:    Medical: Not on file    Non-medical: Not on file  Tobacco Use  . Smoking status: Never Smoker  . Smokeless tobacco: Never Used  Substance and Sexual Activity  . Alcohol use: No    Alcohol/week: 0.0 standard drinks  . Drug use: No  . Sexual activity: Never  Lifestyle  . Physical activity:    Days per week: Not on file    Minutes per session: Not on file  . Stress: Not on file  Relationships  . Social connections:    Talks on phone: Not on file    Gets together: Not on file    Attends religious service: Not on file    Active member of club or organization: Not on file    Attends meetings of clubs or organizations: Not on file    Relationship status: Not on file  . Intimate partner violence:    Fear of current or ex partner: Not on file    Emotionally abused: Not on file    Physically abused: Not on file    Forced sexual activity: Not on file  Other Topics Concern  . Not on file  Social History Narrative  . Not on file     Family History  Problem Relation Age of Onset  . Depression Sister   . Migraines Sister   . Hypertension Mother   . Arthritis/Rheumatoid Mother   . Heart attack Father   . Hypertension Father   . CAD Other   . Hypertension Other   . Diabetes Mellitus II Other   . Arthritis Other      Review of Systems Positive for  syncope Negative for: General:  chills, fever, night sweats or weight changes.  Cardiovascular: PND orthopnea positive for syncope dizziness  Dermatological skin lesions rashes Respiratory: Cough congestion Urologic: Frequent urination urination at night and hematuria Abdominal: negative for nausea, vomiting, diarrhea, bright red blood per rectum, melena, or hematemesis Neurologic: negative for visual changes, and/or hearing changes  All other systems reviewed and are otherwise negative except as noted above.  Labs: Recent Labs    10/21/18 0100 10/21/18 0631  TROPONINI <0.03 <0.03   Lab Results  Component Value Date   WBC 10.4 10/21/2018   HGB 11.2 (L) 10/21/2018   HCT 34.9 (L) 10/21/2018   MCV 91.1 10/21/2018   PLT 240 10/21/2018    Recent Labs  Lab 10/21/18 0608  NA 141  K 3.7  CL 110  CO2 22  BUN 18  CREATININE 0.89  CALCIUM 7.7*  PROT 6.0*  BILITOT 1.3*  ALKPHOS 62  ALT 31  AST 27  GLUCOSE 96   Lab Results  Component Value Date   CHOL 148 08/01/2012   HDL 47 08/01/2012   LDLCALC 80 08/01/2012   TRIG 103 08/01/2012   No results found for: DDIMER  Radiology/Studies:  Ct Head Wo Contrast  Result Date: 10/20/2018 CLINICAL DATA:  Mechanical fall yesterday hitting head. EXAM: CT HEAD WITHOUT CONTRAST TECHNIQUE: Contiguous axial images were obtained from the base of the skull through the vertex without intravenous contrast. COMPARISON:  04/09/2017 FINDINGS: Brain: Right cerebellar encephalomalacia status post suboccipital craniectomy. Mild age related involutional changes of brain appear stable otherwise. Chronic microvascular ischemic disease of periventricular white matter with chronic right basal ganglial lacunar infarcts are noted. Midline fourth ventricle basal cisterns without effacement. No intra-axial mass nor extra-axial collection. Vascular: Atherosclerosis of the carotid siphons. Skull: No acute skull fracture.  Suboccipital craniectomy on right.  Sinuses/Orbits: Intact orbits and globes. Mild ethmoid sinus mucosal thickening. Frothy mucous in the left sphenoid. Maxillary sinuses are clear to the extent included. Other: No significant soft tissue swelling. IMPRESSION: 1. No acute intracranial abnormality. 2. Chronic moderate small vessel ischemia and right basal ganglial lacunar infarct. 3. Status post right suboccipital craniectomy with right cerebellar encephalomalacia. Electronically Signed   By: Ashley Royalty M.D.   On: 10/20/2018 23:56   Dg Chest Portable 1 View  Result Date: 10/21/2018 CLINICAL DATA:  Patient fell.  Evaluate for fracture. EXAM: PORTABLE CHEST 1 VIEW COMPARISON:  08/26/2017 FINDINGS: AP supine view of the chest. 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 The patient is status post L1 kyphoplasty. Thoracic spondylosis with gentle dextroconvex curvature of the thoracic spine is again noted without change. Multilevel degenerative disc disease with endplate spurring is noted. Loop recording device projects over the left hemithorax. Stable borderline cardiomegaly. No acute pulmonary consolidation or overt pulmonary edema. Aortic atherosclerosis without aneurysm. No acute osseous appearing abnormality. IMPRESSION: Chronic elevation and/or eventration of the right hemidiaphragm. Stable borderline cardiomegaly with aortic atherosclerosis. Electronically Signed   By: Ashley Royalty M.D.   On: 10/21/2018 00:37   Dg Femur Min 2 Views Right  Result Date: 10/21/2018 CLINICAL DATA:  Mechanical fall yesterday. EXAM: RIGHT FEMUR 2 VIEWS COMPARISON:  None. FINDINGS: Right total knee arthroplasty. There is a oblique periprosthetic fracture of the distal femoral metaphysis. Medial displacement and overriding of the distal fracture fragments is present. Proximal femur and hip appear intact. Vascular calcifications. IMPRESSION: Oblique periprosthetic fracture of the distal right femoral  metaphysis. Right total knee arthroplasty. Electronically Signed   By: Lucienne Capers M.D.   On: 10/21/2018 00:39    EKG: Normal sinus rhythm with complete heart block  Weights: Filed Weights   10/20/18 2311 10/21/18 0500  Weight: 69.9 kg 69.5 kg     Physical Exam: Blood pressure 93/74, pulse (!) 50, temperature 98.9 F (37.2 C), resp. rate (!) 21, height 4\' 7"  (1.397 m), weight 69.5 kg, SpO2 99 %. Body mass index is 35.61 kg/m. General: Well developed, well nourished, in no acute distress. Head eyes ears nose throat: Normocephalic, atraumatic, sclera non-icteric, no xanthomas, nares are without discharge. No apparent thyromegaly and/or mass  Lungs: Normal respiratory effort.  no wheezes, no rales, no rhonchi.  Heart: RRR with normal S1 S2.  2-3+ right upper sternal border   murmur gallop, no rub, PMI is normal size and placement, carotid upstroke normal without bruit, jugular venous pressure is normal Abdomen: Soft, non-tender, non-distended with normoactive bowel sounds. No hepatomegaly. No rebound/guarding. No obvious abdominal masses. Abdominal aorta is normal size without bruit Extremities: No edema. no cyanosis, no clubbing, no ulcers  Peripheral : 2+ bilateral upper extremity pulses, 2+ bilateral femoral pulses, 2+ bilateral dorsal pedal pulse Neuro: Alert and oriented. No facial asymmetry. No focal deficit. Moves all  extremities spontaneously. Musculoskeletal: Normal muscle tone without kyphosis Psych:  Responds to questions appropriately with a normal affect.    Assessment: 73 year old female with aortic atherosclerosis coronary atherosclerosis essential hypertension mixed hyperlipidemia on appropriate medication management with syncope and femur fracture without evidence of myocardial infarction and or congestive heart failure having complete heart block  Plan: 1.  Continue to manage femur fracture with pain relief at this time until further treatment of complete heart  block for more permanent treatment 2.  Consider echocardiogram for further LV systolic dysfunction valvular heart disease although no current evidence in 2018 of any significant valvular heart disease and no current evidence of heart failure 3.  Permanent pacemaker placement for complete heart block and syncope with all risks and benefits discussed with the patient for which they understand 4.  Further treatment options after about  Signed, Corey Skains M.D. [facility] 10/21/2018, 7:14 AM

## 2018-10-21 NOTE — H&P (Signed)
[facility] NAME: Meghan [name]    MR#:  [id number]  DATE OF BIRTH:  1945-08-13  DATE OF ADMISSION:  10/20/2018  PRIMARY CARE PHYSICIAN: Donato Schultz, MD   REQUESTING/REFERRING PHYSICIAN: Merlyn Lot, MD CHIEF COMPLAINT:   Chief Complaint  Patient presents with  . Leg Injury  . Fall    HISTORY OF PRESENT ILLNESS:  Meghan [name]  is a 73 y.o. female with a known history of multiple medical problems mentioned below, who presented to the emergency room with acute onset of fall likely secondary to syncope.  The patient stated that she tripped on her walker in the bathroom at her rest home however she has no complete memory recollection of the event and has subsequent left facial abrasion.  She denied any chest pain or palpitations or headache before or after her fall.  She has been having occasional dizziness.  She denied any paresthesias or focal muscle weakness.  She admitted to nausea without vomiting and has chronic diarrhea that has not worsened.  She denied any change in her medications or any new medications.  No dysuria, oliguria or hematuria or flank pain.  She stated that when she fell she turned blue and she regained her color after day try to wake her up and threw water on her face at the rest home per her report.  Upon presentation to the emergency room, her blood pressure was 98/39 with a pulse of 42 and otherwise normal vital signs.  The patient had a head CT scan that showed no acute intracranial abnormalities as mentioned below and a portable chest x-ray that showed chronic elevation of the right hemidiaphragm with borderline cardiomegaly and aortic atherosclerosis.  Right knee x-ray showed nondisplaced fracture involving the midportion of the patella and right total knee arthroplasty with anatomic alignment and no complicating features as well as large joint effusion/hemarthrosis.  EKG initially showed sinus bradycardia with a  rate of 41 with left bundle branch block and prolonged PR interval.  Later on showed third-degree AV block with left atrial enlargement and left bundle branch block with a rate of 39.  Dr. Nehemiah Massed was notified about the patient and is planning for a temporary pacemaker.  Dr. Earnestine Leys was also notified about the patient.  The patient was given 50 mcg of IV fentanyl as well as 1 mg of IV morphine sulfate and 4 mg of IV Zofran.  She will be admitted to an ICU bed for further evaluation and management. PAST MEDICAL HISTORY:   Past Medical History:  Diagnosis Date  . Anginal pain ([place])   . Anxiety   . Arthritis    RA  . Asthma   . Brain tumor (benign) ([place])   . CHF (congestive heart failure) ([place])   . Collagen vascular disease ([place])   . COPD (chronic obstructive pulmonary disease) ([place])   . Coronary artery disease   . DDD (degenerative disc disease)   . Depression   . Diabetes mellitus   . GERD (gastroesophageal reflux disease)   . Headache   . Heart murmur   . Heart murmur   . Hypercholesteremia   . Hypertension   . Lumbar degenerative disc disease   . Migraines   . Obesity   . Osteopenia   . Osteoporosis   . Pneumonia   . PTSD (post-traumatic stress disorder)     PAST SURGICAL HISTORY:   Past Surgical History:  Procedure Laterality Date  .  ABDOMINAL HYSTERECTOMY    . CERVICAL FUSION    . CHOLECYSTECTOMY    . COLONOSCOPY WITH PROPOFOL N/A 09/20/2015   Procedure: COLONOSCOPY WITH PROPOFOL;  Surgeon: Josefine Class, MD;  Location: [facility] ENDOSCOPY;  Service: Endoscopy;  Laterality: N/A;  . COLONOSCOPY WITH PROPOFOL N/A 01/27/2016   Procedure: COLONOSCOPY WITH PROPOFOL;  Surgeon: Manya Silvas, MD;  Location: [facility] ENDOSCOPY;  Service: Endoscopy;  Laterality: N/A;  . FINGER ARTHROPLASTY  02/23/2012   Procedure: FINGER ARTHROPLASTY;  Surgeon: Cammie Sickle., MD;  Location: [facility];  Service: Orthopedics;  Laterality: Left;  Extensor carpi radialis  longus to Extensor carpi ulnaris transfer, left Metaphalangeal reconstructions of index and long fingers,  . FOOT ARTHROPLASTY     toes x2 rt foot  . HAND RECONSTRUCTION  2011   right-multiple finger joint reconst  . JOINT REPLACEMENT     bilat knee replacements  . KYPHOPLASTY N/A 06/14/2017   Procedure: KYPHOPLASTY L1;  Surgeon: Hessie Knows, MD;  Location: [facility] ORS;  Service: Orthopedics;  Laterality: N/A;  . LOOP RECORDER INSERTION N/A 06/23/2017   Procedure: LOOP RECORDER INSERTION;  Surgeon: Isaias Cowman, MD;  Location: [facility];  Service: Cardiovascular;  Laterality: N/A;    SOCIAL HISTORY:   Social History   Tobacco Use  . Smoking status: Never Smoker  . Smokeless tobacco: Never Used  Substance Use Topics  . Alcohol use: No    Alcohol/week: 0.0 standard drinks    FAMILY HISTORY:   Family History  Problem Relation Age of Onset  . Depression Sister   . Migraines Sister   . Hypertension Mother   . Arthritis/Rheumatoid Mother   . Heart attack Father   . Hypertension Father   . CAD Other   . Hypertension Other   . Diabetes Mellitus II Other   . Arthritis Other     DRUG ALLERGIES:   Allergies  Allergen Reactions  . Amoxicillin Itching and Other (See Comments)    Has patient had a PCN reaction causing immediate rash, facial/tongue/throat swelling, SOB or lightheadedness with hypotension: Unknown Has patient had a PCN reaction causing severe rash involving mucus membranes or skin necrosis: Unknown Has patient had a PCN reaction that required hospitalization: Unknown Has patient had a PCN reaction occurring within the last 10 years: Unknown If all of the above answers are "NO", then may proceed with Cephalosporin use.   . Gabapentin Other (See Comments)    Pt states that it causes her BP to drop.   . Naproxen Hives  . Nsaids Other (See Comments)    Reaction:  Unknown     REVIEW OF SYSTEMS:   ROS: As per history of present illness. All  pertinent systems were reviewed above. Constitutional, HEENT, cardiovascular, respiratory, GI, GU, musculoskeletal, neuro, psychiatric, endocrine, integumentary and hematologic systems were reviewed and are otherwise negative/unremarkable except for positive findings mentioned above in the HPI.  MEDICATIONS AT HOME:   Prior to Admission medications   Medication Sig Start Date End Date Taking? Authorizing Provider  acetaminophen (TYLENOL) 325 MG tablet Take 650 mg by mouth every 6 (six) hours as needed.   Yes [provider]  busPIRone (BUSPAR) 7.5 MG tablet Take 1 tablet by mouth 2 (two) times daily.   Yes [provider]  Cholecalciferol 5000 units TABS Take 5,000 Units by mouth every 30 (thirty) days.   Yes [provider]  clonazePAM (KLONOPIN) 0.5 MG tablet Take 0.125 mg by mouth 2 (two) times  daily.   Yes [provider]  cyanocobalamin 500 MCG tablet Take 500 mcg by mouth daily.   Yes [provider]  diclofenac sodium (VOLTAREN) 1 % GEL Apply 2 g topically 3 (three) times daily. Apply to the fingers   Yes [provider]  diphenoxylate-atropine (LOMOTIL) 2.5-0.025 MG tablet Take 1 tablet by mouth every 6 (six) hours as needed for diarrhea or loose stools.    Yes [provider]  escitalopram (LEXAPRO) 10 MG tablet Take 1 tablet (10 mg total) by mouth every morning. Patient taking differently: Take 20 mg by mouth every morning.  09/11/16  Yes Rainey Pines, MD  fluticasone (VERAMYST) 27.5 MCG/SPRAY nasal spray Place 2 sprays into the nose 2 (two) times daily.   Yes [provider]  folic acid (FOLVITE) 1 MG tablet Take 1 mg by mouth daily.   Yes [provider]  furosemide (LASIX) 40 MG tablet Take 40 mg by mouth 2 (two) times daily.   Yes [provider]  Ipratropium-Albuterol (COMBIVENT RESPIMAT) 20-100 MCG/ACT AERS respimat Inhale 2 puffs into the lungs every 6 (six) hours as needed for wheezing.   Yes  [provider]  Ipratropium-Albuterol (COMBIVENT RESPIMAT) 20-100 MCG/ACT AERS respimat Inhale 2 puffs into the lungs every 6 (six) hours.   Yes [provider]  lamoTRIgine (LAMICTAL) 100 MG tablet Take 1 tablet (100 mg total) by mouth daily. 06/02/16  Yes Rainey Pines, MD  lisinopril (PRINIVIL,ZESTRIL) 2.5 MG tablet Take 1 tablet by mouth 2 (two) times daily.   Yes [provider]  loperamide (IMODIUM A-D) 2 MG tablet Take 1 tablet (2 mg total) by mouth 4 (four) times daily as needed for diarrhea or loose stools. Patient taking differently: Take 2 mg by mouth as needed for diarrhea or loose stools.  05/10/17  Yes Eula Listen, MD  loratadine (CLARITIN) 10 MG tablet Take 10 mg by mouth daily.   Yes [provider]  metoprolol tartrate (LOPRESSOR) 25 MG tablet Take 25 mg by mouth 2 (two) times daily.   Yes [provider]  mirtazapine (REMERON) 7.5 MG tablet Take 7.5 mg by mouth at bedtime.    Yes [provider]  montelukast (SINGULAIR) 10 MG tablet Take 10 mg by mouth daily.   Yes [provider]  omeprazole (PRILOSEC) 20 MG capsule Take 20 mg by mouth daily.    Yes [provider]  oxyCODONE-acetaminophen (PERCOCET/ROXICET) 5-325 MG tablet Take 1 tablet every 4 (four) hours as needed by mouth for moderate pain or severe pain. 06/15/17  Yes Henreitta Leber, MD  Potassium Chloride ER 20 MEQ TBCR Take 20 mEq by mouth 2 (two) times daily. 05/07/17  Yes Vaughan Basta, MD  pramipexole (MIRAPEX) 0.25 MG tablet Take 0.25 mg by mouth daily.    Yes [provider]  predniSONE (DELTASONE) 5 MG tablet Take 5 mg by mouth daily with breakfast.   Yes [provider]  pregabalin (LYRICA) 50 MG capsule TAKE ONE CAPSULE BY MOUTH TWICE A DAY 06/18/15  Yes [provider]  saxagliptin HCl (ONGLYZA) 5 MG TABS tablet Take 5 mg by mouth at bedtime.   Yes [provider]  Tofacitinib Citrate 5 MG  TABS Take 5 mg by mouth 2 (two) times daily after a meal.    Yes [provider]  topiramate (TOPAMAX) 25 MG tablet Take 25 mg by mouth at bedtime.    Yes [provider]  cefUROXime (CEFTIN) 500 MG tablet Take 1  tablet (500 mg total) by mouth 2 (two) times daily with a meal. Patient not taking: Reported on 10/21/2018 08/28/17   Bettey Costa, MD  methotrexate (RHEUMATREX) 7.5 MG tablet Take 7.5 mg by mouth once a week. Caution" Chemotherapy. Protect from light.    [provider]  promethazine (PHENERGAN) 25 MG suppository Place 1 suppository (25 mg total) rectally every 6 (six) hours as needed for nausea. 05/10/17 05/10/18  Eula Listen, MD      VITAL SIGNS:  Blood pressure (!) 108/49, pulse (!) 47, temperature 98.6 F (37 C), temperature source Oral, resp. rate 14, height 4\' 8"  (1.422 m), weight 69.9 kg, SpO2 97 %.  PHYSICAL EXAMINATION:  Physical Exam  GENERAL:  73 y.o.-year-old patient lying in the bed with mild distress from nausea EYES: Pupils equal, round, reactive to light and accommodation. No scleral icterus. Extraocular muscles intact.  HEENT: Head atraumatic, normocephalic. Oropharynx and nasopharynx clear.  NECK:  Supple, no jugular venous distention. No thyroid enlargement, no tenderness.  LUNGS: Normal breath sounds bilaterally, no wheezing, rales,rhonchi or crepitation. No use of accessory muscles of respiration.  CARDIOVASCULAR: S1, S2 normal. No murmurs, rubs, or gallops.  ABDOMEN: Soft, nontender, nondistended. Bowel sounds present. No organomegaly or mass.  EXTREMITIES: No pedal edema, cyanosis, or clubbing. Musculoskeletal: Right knee tenderness and swelling in knee brace NEUROLOGIC: Cranial nerves II through XII are intact. Muscle strength 5/5 in all extremities. Sensation intact. Gait not checked.  PSYCHIATRIC: The patient is alert and oriented x 3.  SKIN: No obvious rash, lesion, or ulcer.   LABORATORY PANEL:   CBC Recent Labs   Lab 10/21/18 0100  WBC 10.0  HGB 11.2*  HCT 34.2*  PLT 262   ------------------------------------------------------------------------------------------------------------------  Chemistries  Recent Labs  Lab 10/21/18 0100  NA 140  K 3.7  CL 108  CO2 22  GLUCOSE 91  BUN 15  CREATININE 0.81  CALCIUM 7.6*  AST 32  ALT 34  ALKPHOS 62  BILITOT 1.3*   ------------------------------------------------------------------------------------------------------------------  Cardiac Enzymes Recent Labs  Lab 10/21/18 0100  TROPONINI <0.03   ------------------------------------------------------------------------------------------------------------------  RADIOLOGY:  Ct Head Wo Contrast  Result Date: 10/20/2018 CLINICAL DATA:  Mechanical fall yesterday hitting head. EXAM: CT HEAD WITHOUT CONTRAST TECHNIQUE: Contiguous axial images were obtained from the base of the skull through the vertex without intravenous contrast. COMPARISON:  04/09/2017 FINDINGS: Brain: Right cerebellar encephalomalacia status post suboccipital craniectomy. Mild age related involutional changes of brain appear stable otherwise. Chronic microvascular ischemic disease of periventricular white matter with chronic right basal ganglial lacunar infarcts are noted. Midline fourth ventricle basal cisterns without effacement. No intra-axial mass nor extra-axial collection. Vascular: Atherosclerosis of the carotid siphons. Skull: No acute skull fracture.  Suboccipital craniectomy on right. Sinuses/Orbits: Intact orbits and globes. Mild ethmoid sinus mucosal thickening. Frothy mucous in the left sphenoid. Maxillary sinuses are clear to the extent included. Other: No significant soft tissue swelling. IMPRESSION: 1. No acute intracranial abnormality. 2. Chronic moderate small vessel ischemia and right basal ganglial lacunar infarct. 3. Status post right suboccipital craniectomy with right cerebellar encephalomalacia. Electronically  Signed   By: Ashley Royalty M.D.   On: 10/20/2018 23:56   Dg Chest Portable 1 View  Result Date: 10/21/2018 CLINICAL DATA:  Patient fell.  Evaluate for fracture. EXAM: PORTABLE CHEST 1 VIEW COMPARISON:  08/26/2017 FINDINGS: AP supine view of the chest. 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 The patient is status post L1 kyphoplasty. Thoracic  spondylosis with gentle dextroconvex curvature of the thoracic spine is again noted without change. Multilevel degenerative disc disease with endplate spurring is noted. Loop recording device projects over the left hemithorax. Stable borderline cardiomegaly. No acute pulmonary consolidation or overt pulmonary edema. Aortic atherosclerosis without aneurysm. No acute osseous appearing abnormality. IMPRESSION: Chronic elevation and/or eventration of the right hemidiaphragm. Stable borderline cardiomegaly with aortic atherosclerosis. Electronically Signed   By: Ashley Royalty M.D.   On: 10/21/2018 00:37   Dg Femur Min 2 Views Right  Result Date: 10/21/2018 CLINICAL DATA:  Mechanical fall yesterday. EXAM: RIGHT FEMUR 2 VIEWS COMPARISON:  None. FINDINGS: Right total knee arthroplasty. There is a oblique periprosthetic fracture of the distal femoral metaphysis. Medial displacement and overriding of the distal fracture fragments is present. Proximal femur and hip appear intact. Vascular calcifications. IMPRESSION: Oblique periprosthetic fracture of the distal right femoral metaphysis. Right total knee arthroplasty. Electronically Signed   By: Lucienne Capers M.D.   On: 10/21/2018 00:39      IMPRESSION AND PLAN:   #1.  Syncope likely secondary to third-degree AV block.  The patient will be admitted to an ICU bed.  She will likely have a temporary pacemaker by EP as mentioned above.  She will ultimately need a permanent pacemaker placement.  IV atropine can be utilized as needed.  2D echo will be obtained.   Cardiology consult will be obtained.  Dr. Nehemiah Massed is aware about the patient.  The patient's Lopressor will obviously be held off.  2..  Right distal femur fracture.  Orthopedic consultation will be obtained.  Dr. Sabra Heck is aware about the patient.  Pain management will be provided.  The patient has a history of heart failure, coronary artery disease with no history for CVA or diabetes mellitus on insulin or renal failure with a creatinine more than 2.  Per the revised cardiac risk index and given her third-degree AV block she is considered high risk surgical candidate and therefore cardiology clearance will be needed.  She has a history of COPD however does not manifest active exacerbation.  3.  Type 2 diabetes mellitus.  The patient will be placed on supplemental coverage with NovoLog.  4.  Hypertension.  We will continue her lisinopril but hold off her Lopressor.  5..  COPD.  No current exacerbation.  We will continue her inhalers and Singulair.  6.  Depression and posttraumatic stress disorder.  We will continue her Klonopin, Topamax and Lamictal.  7.  DVT prophylaxis will be provided with SCDs for now.  Medical prophylaxis is being held pending surgical intervention.  8.  GI prophylaxis with continued PPI therapy.   All the records are reviewed and case discussed with ED provider.  I also discussed and signed out this case with the [facility] intensivist Dr. Jimmy Footman.   Management plans discussed with the patient, family and they are in agreement.  CODE STATUS: The patient is DNR  TOTAL TIME TAKING CARE OF THIS PATIENT: 65 minutes.    Christel Mormon M.D on 10/21/2018 at 4:57 AM  Between 7am to 6pm - Pager - [phone]  After 6pm go to [email or web address] - Proofreader  [facility]  Office  [phone]  CC: Primary care physician; Donato Schultz, MD   Note: This dictation was prepared with Dragon dictation along with smaller phrase technology. Any  transcriptional errors that result from this process are unintentional.

## 2018-10-21 NOTE — Consult Note (Addendum)
Reason for Consult:Right femur fx Referring Physician: [name] is an 73 y.o. female.  HPI: Meghan [name] fell while going to the bathroom at the [facility] where [facility]. She says she tripped but as she doesn't really remember it it is certainly possible it was syncopal. In any event she lost consciousness and when she recovered she had significant left knee pain. She was evaluated at [facility] where she was found to have a distal periprosthetic femur fx and was in complete heart block. They had no one who could implant a pacemaker and she was transferred to [facility] for further treatment and orthopedic surgery was consulted for the fx. She c/o right knee pain.  Past Medical History:  Diagnosis Date  . Anginal pain ([place])   . Anxiety   . Arthritis    RA  . Asthma   . Brain tumor (benign) ([place])   . CHF (congestive heart failure) ([place])   . Collagen vascular disease ([place])   . COPD (chronic obstructive pulmonary disease) ([place])   . Coronary artery disease   . DDD (degenerative disc disease)   . Depression   . Diabetes mellitus   . GERD (gastroesophageal reflux disease)   . Headache   . Heart murmur   . Heart murmur   . Hypercholesteremia   . Hypertension   . Lumbar degenerative disc disease   . Migraines   . Obesity   . Osteopenia   . Osteoporosis   . Pneumonia   . PTSD (post-traumatic stress disorder)     Past Surgical History:  Procedure Laterality Date  . ABDOMINAL HYSTERECTOMY    . CERVICAL FUSION    . CHOLECYSTECTOMY    . COLONOSCOPY WITH PROPOFOL N/A 09/20/2015   Procedure: COLONOSCOPY WITH PROPOFOL;  Surgeon: Josefine Class, MD;  Location: [facility] ENDOSCOPY;  Service: Endoscopy;  Laterality: N/A;  . COLONOSCOPY WITH PROPOFOL N/A 01/27/2016   Procedure: COLONOSCOPY WITH PROPOFOL;  Surgeon: Manya Silvas, MD;  Location: [facility] ENDOSCOPY;  Service: Endoscopy;  Laterality: N/A;  . FINGER ARTHROPLASTY  02/23/2012   Procedure: FINGER ARTHROPLASTY;  Surgeon: Cammie Sickle., MD;   Location: [place];  Service: Orthopedics;  Laterality: Left;  Extensor carpi radialis longus to Extensor carpi ulnaris transfer, left Metaphalangeal reconstructions of index and long fingers,  . FOOT ARTHROPLASTY     toes x2 rt foot  . HAND RECONSTRUCTION  2011   right-multiple finger joint reconst  . JOINT REPLACEMENT     bilat knee replacements  . KYPHOPLASTY N/A 06/14/2017   Procedure: KYPHOPLASTY L1;  Surgeon: Hessie Knows, MD;  Location: [facility] ORS;  Service: Orthopedics;  Laterality: N/A;  . LOOP RECORDER INSERTION N/A 06/23/2017   Procedure: LOOP RECORDER INSERTION;  Surgeon: Isaias Cowman, MD;  Location: [facility];  Service: Cardiovascular;  Laterality: N/A;    Family History  Problem Relation Age of Onset  . Depression Sister   . Migraines Sister   . Hypertension Mother   . Arthritis/Rheumatoid Mother   . Heart attack Father   . Hypertension Father   . CAD Other   . Hypertension Other   . Diabetes Mellitus II Other   . Arthritis Other     Social History:  reports that she has never smoked. She has never used smokeless tobacco. She reports that she does not drink alcohol or use drugs.  Allergies:  Allergies  Allergen Reactions  . Amoxicillin Itching and Other (See Comments)    Has  patient had a PCN reaction causing immediate rash, facial/tongue/throat swelling, SOB or lightheadedness with hypotension: Unknown Has patient had a PCN reaction causing severe rash involving mucus membranes or skin necrosis: Unknown Has patient had a PCN reaction that required hospitalization: Unknown Has patient had a PCN reaction occurring within the last 10 years: Unknown If all of the above answers are "NO", then may proceed with Cephalosporin use.   . Gabapentin Other (See Comments)    Pt states that it causes her BP to drop.   . Naproxen Hives  . Nsaids Other (See Comments)    Reaction:  Unknown     Medications: I have reviewed the patient's  current medications.  Results for orders placed or performed during the hospital encounter of 10/20/18 (from the past 48 hour(s))  CBC with Differential/Platelet     Status: Abnormal   Collection Time: 10/21/18  1:00 AM  Result Value Ref Range   WBC 10.0 4.0 - 10.5 K/uL   RBC 3.86 (L) 3.87 - 5.11 MIL/uL   Hemoglobin 11.2 (L) 12.0 - 15.0 g/dL   HCT 34.2 (L) 36.0 - 46.0 %   MCV 88.6 80.0 - 100.0 fL   MCH 29.0 26.0 - 34.0 pg   MCHC 32.7 30.0 - 36.0 g/dL   RDW 15.3 11.5 - 15.5 %   Platelets 262 150 - 400 K/uL   nRBC 0.0 0.0 - 0.2 %   Neutrophils Relative % 75 %   Neutro Abs 7.5 1.7 - 7.7 K/uL   Lymphocytes Relative 15 %   Lymphs Abs 1.5 0.7 - 4.0 K/uL   Monocytes Relative 9 %   Monocytes Absolute 0.9 0.1 - 1.0 K/uL   Eosinophils Relative 0 %   Eosinophils Absolute 0.0 0.0 - 0.5 K/uL   Basophils Relative 0 %   Basophils Absolute 0.0 0.0 - 0.1 K/uL   Immature Granulocytes 1 %   Abs Immature Granulocytes 0.09 (H) 0.00 - 0.07 K/uL    Comment: Performed at [facility], [address] [place], [place] [address]  Comprehensive metabolic panel     Status: Abnormal   Collection Time: 10/21/18  1:00 AM  Result Value Ref Range   Sodium 140 135 - 145 mmol/L   Potassium 3.7 3.5 - 5.1 mmol/L   Chloride 108 98 - 111 mmol/L   CO2 22 22 - 32 mmol/L   Glucose, Bld 91 70 - 99 mg/dL   BUN 15 8 - 23 mg/dL   Creatinine, Ser 0.81 0.44 - 1.00 mg/dL   Calcium 7.6 (L) 8.9 - 10.3 mg/dL   Total Protein 5.9 (L) 6.5 - 8.1 g/dL   Albumin 3.2 (L) 3.5 - 5.0 g/dL   AST 32 15 - 41 U/L   ALT 34 0 - 44 U/L   Alkaline Phosphatase 62 38 - 126 U/L   Total Bilirubin 1.3 (H) 0.3 - 1.2 mg/dL   GFR calc non Af Amer >60 >60 mL/min   GFR calc Af Amer >60 >60 mL/min   Anion gap 10 5 - 15    Comment: Performed at [facility], [address] [place], [place] [address]  Type and screen [place]     Status: None   Collection Time: 10/21/18  1:00 AM  Result Value Ref Range    ABO/RH(D) O NEG    Antibody Screen NEG    Sample Expiration      10/24/2018 Performed at [facility], [address] [place], [place] [address]  Troponin I - ONCE - STAT     Status: None   Collection Time: 10/21/18  1:00 AM  Result Value Ref Range   Troponin I <0.03 <0.03 ng/mL    Comment: Performed at [facility], [address] [place], [place] [address]  Protime-INR     Status: None   Collection Time: 10/21/18  4:11 AM  Result Value Ref Range   Prothrombin Time 14.9 11.4 - 15.2 seconds   INR 1.2 0.8 - 1.2    Comment: (NOTE) INR goal varies based on device and disease states. Performed at [facility], [address] [place], [place] [id number]   Urinalysis, Routine w reflex microscopic     Status: Abnormal   Collection Time: 10/21/18  4:12 AM  Result Value Ref Range   Color, Urine YELLOW (A) YELLOW   APPearance CLEAR (A) CLEAR   Specific Gravity, Urine 1.014 1.005 - 1.030   pH 6.0 5.0 - 8.0   Glucose, UA NEGATIVE NEGATIVE mg/dL   Hgb urine dipstick NEGATIVE NEGATIVE   Bilirubin Urine NEGATIVE NEGATIVE   Ketones, ur NEGATIVE NEGATIVE mg/dL   Protein, ur NEGATIVE NEGATIVE mg/dL   Nitrite NEGATIVE NEGATIVE   Leukocytes,Ua LARGE (A) NEGATIVE   RBC / HPF 0-5 0 - 5 RBC/hpf   WBC, UA 11-20 0 - 5 WBC/hpf   Bacteria, UA RARE (A) NONE SEEN   Squamous Epithelial / LPF 0-5 0 - 5    Comment: Performed at [facility], [address] [place], [place] [address]  Glucose, capillary     Status: None   Collection Time: 10/21/18  4:47 AM  Result Value Ref Range   Glucose-Capillary 77 70 - 99 mg/dL  Comprehensive metabolic panel     Status: Abnormal   Collection Time: 10/21/18  6:08 AM  Result Value Ref Range   Sodium 141 135 - 145 mmol/L   Potassium 3.7 3.5 - 5.1 mmol/L   Chloride 110 98 - 111 mmol/L   CO2 22 22 - 32 mmol/L   Glucose, Bld 96 70 - 99 mg/dL   BUN 18 8 - 23 mg/dL   Creatinine, Ser 0.89 0.44 - 1.00 mg/dL   Calcium 7.7  (L) 8.9 - 10.3 mg/dL   Total Protein 6.0 (L) 6.5 - 8.1 g/dL   Albumin 3.0 (L) 3.5 - 5.0 g/dL   AST 27 15 - 41 U/L   ALT 31 0 - 44 U/L   Alkaline Phosphatase 62 38 - 126 U/L   Total Bilirubin 1.3 (H) 0.3 - 1.2 mg/dL   GFR calc non Af Amer >60 >60 mL/min   GFR calc Af Amer >60 >60 mL/min   Anion gap 9 5 - 15    Comment: Performed at [facility], [address] [place],  [address]  CBC     Status: Abnormal   Collection Time: 10/21/18  6:08 AM  Result Value Ref Range   WBC 10.4 4.0 - 10.5 K/uL   RBC 3.83 (L) 3.87 - 5.11 MIL/uL   Hemoglobin 11.2 (L) 12.0 - 15.0 g/dL   HCT 34.9 (L) 36.0 - 46.0 %   MCV 91.1 80.0 - 100.0 fL   MCH 29.2 26.0 - 34.0 pg   MCHC 32.1 30.0 - 36.0 g/dL   RDW 15.6 (H) 11.5 - 15.5 %   Platelets 240 150 - 400 K/uL   nRBC 0.0 0.0 - 0.2 %    Comment: Performed at [facility], [address]  [place], [place] [address]  Troponin I - Now Then Q6H     Status: None   Collection Time: 10/21/18  6:31 AM  Result Value Ref Range   Troponin I <0.03 <0.03 ng/mL    Comment: Performed at [facility], [place], [place] [address]  Glucose, capillary     Status: None   Collection Time: 10/21/18  7:56 AM  Result Value Ref Range   Glucose-Capillary 76 70 - 99 mg/dL  MRSA PCR Screening     Status: Abnormal   Collection Time: 10/21/18  9:53 AM  Result Value Ref Range   MRSA by PCR POSITIVE (A) NEGATIVE    Comment:        The GeneXpert MRSA Assay (FDA approved for NASAL specimens only), is one component of a comprehensive MRSA colonization surveillance program. It is not intended to diagnose MRSA infection nor to guide or monitor treatment for MRSA infections. RESULT CALLED TO, READ BACK BY AND VERIFIED WITH: STACEY POLLY @1123  ON 10/21/2018 BY FMW Performed at [facility], [place], [place] [address]   Glucose, capillary     Status: Abnormal   Collection Time: 10/21/18 11:42 AM  Result  Value Ref Range   Glucose-Capillary 105 (H) 70 - 99 mg/dL    Ct Head Wo Contrast  Result Date: 10/20/2018 CLINICAL DATA:  Mechanical fall yesterday hitting head. EXAM: CT HEAD WITHOUT CONTRAST TECHNIQUE: Contiguous axial images were obtained from the base of the skull through the vertex without intravenous contrast. COMPARISON:  04/09/2017 FINDINGS: Brain: Right cerebellar encephalomalacia status post suboccipital craniectomy. Mild age related involutional changes of brain appear stable otherwise. Chronic microvascular ischemic disease of periventricular white matter with chronic right basal ganglial lacunar infarcts are noted. Midline fourth ventricle basal cisterns without effacement. No intra-axial mass nor extra-axial collection. Vascular: Atherosclerosis of the carotid siphons. Skull: No acute skull fracture.  Suboccipital craniectomy on right. Sinuses/Orbits: Intact orbits and globes. Mild ethmoid sinus mucosal thickening. Frothy mucous in the left sphenoid. Maxillary sinuses are clear to the extent included. Other: No significant soft tissue swelling. IMPRESSION: 1. No acute intracranial abnormality. 2. Chronic moderate small vessel ischemia and right basal ganglial lacunar infarct. 3. Status post right suboccipital craniectomy with right cerebellar encephalomalacia. Electronically Signed   By: Ashley Royalty M.D.   On: 10/20/2018 23:56   Dg Chest Portable 1 View  Result Date: 10/21/2018 CLINICAL DATA:  Patient fell.  Evaluate for fracture. EXAM: PORTABLE CHEST 1 VIEW COMPARISON:  08/26/2017 FINDINGS: AP supine view of the chest. 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 The patient is status post L1 kyphoplasty. Thoracic spondylosis with gentle dextroconvex curvature of the thoracic spine is again noted without change. Multilevel degenerative disc disease with endplate spurring is noted. Loop recording device projects over the left  hemithorax. Stable borderline cardiomegaly. No acute pulmonary consolidation or overt pulmonary edema. Aortic atherosclerosis without aneurysm. No acute osseous appearing abnormality. IMPRESSION: Chronic elevation and/or eventration of the right hemidiaphragm. Stable borderline cardiomegaly with aortic atherosclerosis. Electronically Signed   By: Ashley Royalty M.D.   On: 10/21/2018 00:37   Dg Femur Min 2 Views Right  Result Date: 10/21/2018 CLINICAL DATA:  Mechanical fall yesterday. EXAM: RIGHT FEMUR 2 VIEWS COMPARISON:  None. FINDINGS: Right total knee arthroplasty. There is a oblique periprosthetic fracture of the distal femoral metaphysis. Medial displacement and overriding of the distal fracture fragments is present. Proximal femur and hip  appear intact. Vascular calcifications. IMPRESSION: Oblique periprosthetic fracture of the distal right femoral metaphysis. Right total knee arthroplasty. Electronically Signed   By: Lucienne Capers M.D.   On: 10/21/2018 00:39    Review of Systems  Constitutional: Negative for weight loss.  HENT: Negative for ear discharge, ear pain, hearing loss and tinnitus.   Eyes: Negative for blurred vision, double vision, photophobia and pain.  Respiratory: Negative for cough, sputum production and shortness of breath.   Cardiovascular: Negative for chest pain.  Gastrointestinal: Negative for abdominal pain, nausea and vomiting.  Genitourinary: Negative for dysuria, flank pain, frequency and urgency.  Musculoskeletal: Positive for joint pain (Right knee). Negative for back pain, falls, myalgias and neck pain.  Neurological: Negative for dizziness, tingling, sensory change, focal weakness, loss of consciousness and headaches.  Endo/Heme/Allergies: Does not bruise/bleed easily.  Psychiatric/Behavioral: Negative for depression, memory loss and substance abuse. The patient is not nervous/anxious.    There were no vitals taken for this visit. Physical Exam   Constitutional: She appears well-developed and well-nourished. No distress.  HENT:  Head: Normocephalic and atraumatic.  Eyes: Conjunctivae are normal. Right eye exhibits no discharge. Left eye exhibits no discharge. No scleral icterus.  Neck: Normal range of motion.  Cardiovascular: Regular rhythm. Bradycardia present.  Respiratory: Effort normal. No respiratory distress.  Musculoskeletal:     Comments: LLE No traumatic wounds, ecchymosis, or rash  KI in place  No ankle effusion  Sens DPN, SPN, TN intact  Motor EHL, ext, flex, evers 5/5  DP 0, PT 0, No significant edema  Neurological: She is alert.  Skin: Skin is warm and dry. She is not diaphoretic.  Psychiatric: She has a normal mood and affect. Her behavior is normal.    Assessment/Plan: Right distal femur periprosthetic fx -- Plan on ORIF tomorrow morning with Dr. Mardelle Matte. NPO after MN.  Complete heart block -- For pacer this afternoon with Dr. Caryl Comes Multiple medical problems including DM, HTN, COPD, depression, and PTSD -- per primary team  Discussed, reviewed chart and x-rays, agree with above.  Surgery planned for tomorrow.  Lisette Abu, PA-C Orthopedic Surgery [phone] 10/21/2018, 2:50 PM

## 2018-10-21 NOTE — Consult Note (Addendum)
Medical Consultation   Meghan [name]  CWC:[id number]  DOB: Mar 11, 1946  DOA: 10/21/2018  PCP: Donato Schultz, MD   Requesting physician: Dr. Caryl Comes (electrophysiology)   Reason for consultation: Medical management   History of Present Illness: Meghan [name] is an 73 y.o. female with history of rheumatoid arthritis, bipolar disorder, type II diabetes mellitus, history of peptic ulcer disease, and COPD, who presented to the [facility] on 10/20/2018 after a fall the day before, suspected to be related to syncope, and involving her hitting her head.  She was found to be in complete heart block in the emergency department, diagnosed with periprosthetic distal right femur fracture, and was admitted to the ICU there.  Cardiology consultation was obtained at the outside hospital, permanent pacemaker was recommended, and arrangements were made for transfer to [facility] for facilitation of this.  Orthopedic surgery at [facility] was consulted, agreed to see the patient in consultation, asked that she be kept n.p.o. after midnight for likely ORIF.  Patient is now status post permanent pacemaker placement, interviewed and examined in the ICU.  Patient reports recent treatment for pneumonia and COPD exacerbation with antibiotics and increased steroids.  She reports that she has continued to have a productive cough, but has been afebrile and dyspnea is back to baseline.  She is back to taking 5 mg prednisone daily now.  She denies any chest pain.  She uses a walker to ambulate, reports some shortness of breath with walking across a room, states that this has been her baseline and is unchanged.   Review of Systems:  ROS As per HPI otherwise 10 point review of systems negative   Past Medical History: Past Medical History:  Diagnosis Date  . Anginal pain ([place])   . Anxiety   . Arthritis    RA  . Asthma   . Brain tumor (benign) ([place])   . CHF (congestive heart failure) ([place])   .  Collagen vascular disease ([place])   . COPD (chronic obstructive pulmonary disease) ([place])   . Coronary artery disease   . DDD (degenerative disc disease)   . Depression   . Diabetes mellitus   . GERD (gastroesophageal reflux disease)   . Headache   . Heart murmur   . Heart murmur   . Hypercholesteremia   . Hypertension   . Lumbar degenerative disc disease   . Migraines   . Obesity   . Osteopenia   . Osteoporosis   . Pneumonia   . PTSD (post-traumatic stress disorder)     Past Surgical History: Past Surgical History:  Procedure Laterality Date  . ABDOMINAL HYSTERECTOMY    . CERVICAL FUSION    . CHOLECYSTECTOMY    . COLONOSCOPY WITH PROPOFOL N/A 09/20/2015   Procedure: COLONOSCOPY WITH PROPOFOL;  Surgeon: Josefine Class, MD;  Location: [facility] ENDOSCOPY;  Service: Endoscopy;  Laterality: N/A;  . COLONOSCOPY WITH PROPOFOL N/A 01/27/2016   Procedure: COLONOSCOPY WITH PROPOFOL;  Surgeon: Manya Silvas, MD;  Location: [facility] ENDOSCOPY;  Service: Endoscopy;  Laterality: N/A;  . FINGER ARTHROPLASTY  02/23/2012   Procedure: FINGER ARTHROPLASTY;  Surgeon: Cammie Sickle., MD;  Location: [place];  Service: Orthopedics;  Laterality: Left;  Extensor carpi radialis longus to Extensor carpi ulnaris transfer, left Metaphalangeal reconstructions of index and long fingers,  . FOOT ARTHROPLASTY     toes x2 rt foot  . HAND  RECONSTRUCTION  2011   right-multiple finger joint reconst  . JOINT REPLACEMENT     bilat knee replacements  . KYPHOPLASTY N/A 06/14/2017   Procedure: KYPHOPLASTY L1;  Surgeon: Hessie Knows, MD;  Location: [facility] ORS;  Service: Orthopedics;  Laterality: N/A;  . LOOP RECORDER INSERTION N/A 06/23/2017   Procedure: LOOP RECORDER INSERTION;  Surgeon: Isaias Cowman, MD;  Location: [place] CV LAB;  Service: Cardiovascular;  Laterality: N/A;     Allergies:   Allergies  Allergen Reactions  . Amoxicillin Itching and Other (See Comments)    Has  patient had a PCN reaction causing immediate rash, facial/tongue/throat swelling, SOB or lightheadedness with hypotension: Unknown Has patient had a PCN reaction causing severe rash involving mucus membranes or skin necrosis: Unknown Has patient had a PCN reaction that required hospitalization: Unknown Has patient had a PCN reaction occurring within the last 10 years: Unknown If all of the above answers are "NO", then may proceed with Cephalosporin use.   . Gabapentin Other (See Comments)    Pt states that it causes her BP to drop.   . Naproxen Hives  . Nsaids Other (See Comments)    Reaction:  Unknown      Social History:  reports that she has never smoked. She has never used smokeless tobacco. She reports that she does not drink alcohol or use drugs.   Family History: Family History  Problem Relation Age of Onset  . Depression Sister   . Migraines Sister   . Hypertension Mother   . Arthritis/Rheumatoid Mother   . Heart attack Father   . Hypertension Father   . CAD Other   . Hypertension Other   . Diabetes Mellitus II Other   . Arthritis Other      Physical Exam: Vitals:   10/21/18 1805 10/21/18 1810 10/21/18 1815 10/21/18 1820  BP: (!) 94/40 106/62 113/68 (!) 124/50  Pulse: 65 60 65 65  Resp: 19 (!) 21 (!) 22 (!) 22  SpO2: 100% 100% 100% 100%    Constitutional:   Alert and awake, oriented x3, not in any acute distress. Eyes: PERLA, EOMI, irises appear normal, anicteric sclera,  ENMT: external ears and nose appear normal, lips appears normal, oropharynx mucosa, tongue, posterior pharynx appear normal  Neck: neck appears normal, no masses, normal ROM, no thyromegaly, no JVD  CVS: S1-S2 clear, no murmur rubs or gallops, normal pedal pulses  Respiratory:  Slightly diminished bilaterally, no wheezing, rales or rhonchi. Occasional cough. Respiratory effort normal. No accessory muscle use.  Abdomen: soft nontender, nondistended, normal bowel sounds  Musculoskeletal: : no  cyanosis. Left knee immobilized, neurovascularly intact distally   Neuro: Cranial nerves II-XII intact, sensation intact, moving all extremities Psych: judgement and insight appear normal, stable mood and affect, mental status Skin: faint ecchymosis to left face. Warm, dry     Data reviewed:  I have personally reviewed following labs and imaging studies Labs:  CBC: Recent Labs  Lab 10/21/18 0100 10/21/18 0608  WBC 10.0 10.4  NEUTROABS 7.5  --   HGB 11.2* 11.2*  HCT 34.2* 34.9*  MCV 88.6 91.1  PLT 262 053    Basic Metabolic Panel: Recent Labs  Lab 10/21/18 0100 10/21/18 0608  NA 140 141  K 3.7 3.7  CL 108 110  CO2 22 22  GLUCOSE 91 96  BUN 15 18  CREATININE 0.81 0.89  CALCIUM 7.6* 7.7*   GFR Estimated Creatinine Clearance: 43.5 mL/min (by C-G formula based on SCr of 0.89  mg/dL). Liver Function Tests: Recent Labs  Lab 10/21/18 0100 10/21/18 0608  AST 32 27  ALT 34 31  ALKPHOS 62 62  BILITOT 1.3* 1.3*  PROT 5.9* 6.0*  ALBUMIN 3.2* 3.0*   No results for input(s): LIPASE, AMYLASE in the last 168 hours. No results for input(s): AMMONIA in the last 168 hours. Coagulation profile Recent Labs  Lab 10/21/18 0411  INR 1.2    Cardiac Enzymes: Recent Labs  Lab 10/21/18 0100 10/21/18 0631  TROPONINI <0.03 <0.03   BNP: Invalid input(s): POCBNP CBG: Recent Labs  Lab 10/21/18 0447 10/21/18 0756 10/21/18 1142  GLUCAP 77 76 105*   D-Dimer No results for input(s): DDIMER in the last 72 hours. Hgb A1c No results for input(s): HGBA1C in the last 72 hours. Lipid Profile No results for input(s): CHOL, HDL, LDLCALC, TRIG, CHOLHDL, LDLDIRECT in the last 72 hours. Thyroid function studies No results for input(s): TSH, T4TOTAL, T3FREE, THYROIDAB in the last 72 hours.  Invalid input(s): FREET3 Anemia work up No results for input(s): VITAMINB12, FOLATE, FERRITIN, TIBC, IRON, RETICCTPCT in the last 72 hours. Urinalysis    Component Value Date/Time    COLORURINE YELLOW (A) 10/21/2018 0412   APPEARANCEUR CLEAR (A) 10/21/2018 0412   APPEARANCEUR Turbid (A) 05/05/2016 1010   LABSPEC 1.014 10/21/2018 0412   PHURINE 6.0 10/21/2018 0412   GLUCOSEU NEGATIVE 10/21/2018 0412   HGBUR NEGATIVE 10/21/2018 0412   BILIRUBINUR NEGATIVE 10/21/2018 0412   BILIRUBINUR Negative 05/05/2016 1010   KETONESUR NEGATIVE 10/21/2018 0412   PROTEINUR NEGATIVE 10/21/2018 0412   NITRITE NEGATIVE 10/21/2018 0412   LEUKOCYTESUR LARGE (A) 10/21/2018 0412     Microbiology Recent Results (from the past 240 hour(s))  MRSA PCR Screening     Status: Abnormal   Collection Time: 10/21/18  9:53 AM  Result Value Ref Range Status   MRSA by PCR POSITIVE (A) NEGATIVE Final    Comment:        The GeneXpert MRSA Assay (FDA approved for NASAL specimens only), is one component of a comprehensive MRSA colonization surveillance program. It is not intended to diagnose MRSA infection nor to guide or monitor treatment for MRSA infections. RESULT CALLED TO, READ BACK BY AND VERIFIED WITH: STACEY POLLY @1123  ON 10/21/2018 BY FMW Performed at [facility], [address] [place], [place] [address]        Inpatient Medications:   Scheduled Meds: . chlorhexidine       Continuous Infusions: . sodium chloride    . [START ON 10/22/2018] vancomycin       Radiological Exams on Admission: Ct Head Wo Contrast  Result Date: 10/20/2018 CLINICAL DATA:  Mechanical fall yesterday hitting head. EXAM: CT HEAD WITHOUT CONTRAST TECHNIQUE: Contiguous axial images were obtained from the base of the skull through the vertex without intravenous contrast. COMPARISON:  04/09/2017 FINDINGS: Brain: Right cerebellar encephalomalacia status post suboccipital craniectomy. Mild age related involutional changes of brain appear stable otherwise. Chronic microvascular ischemic disease of periventricular white matter with chronic right basal ganglial lacunar infarcts are noted. Midline  fourth ventricle basal cisterns without effacement. No intra-axial mass nor extra-axial collection. Vascular: Atherosclerosis of the carotid siphons. Skull: No acute skull fracture.  Suboccipital craniectomy on right. Sinuses/Orbits: Intact orbits and globes. Mild ethmoid sinus mucosal thickening. Frothy mucous in the left sphenoid. Maxillary sinuses are clear to the extent included. Other: No significant soft tissue swelling. IMPRESSION: 1. No acute intracranial abnormality. 2. Chronic moderate small vessel ischemia and right basal ganglial lacunar infarct.  3. Status post right suboccipital craniectomy with right cerebellar encephalomalacia. Electronically Signed   By: Ashley Royalty M.D.   On: 10/20/2018 23:56   Dg Chest Portable 1 View  Result Date: 10/21/2018 CLINICAL DATA:  Patient fell.  Evaluate for fracture. EXAM: PORTABLE CHEST 1 VIEW COMPARISON:  08/26/2017 FINDINGS: AP supine view of the chest. 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 The patient is status post L1 kyphoplasty. Thoracic spondylosis with gentle dextroconvex curvature of the thoracic spine is again noted without change. Multilevel degenerative disc disease with endplate spurring is noted. Loop recording device projects over the left hemithorax. Stable borderline cardiomegaly. No acute pulmonary consolidation or overt pulmonary edema. Aortic atherosclerosis without aneurysm. No acute osseous appearing abnormality. IMPRESSION: Chronic elevation and/or eventration of the right hemidiaphragm. Stable borderline cardiomegaly with aortic atherosclerosis. Electronically Signed   By: Ashley Royalty M.D.   On: 10/21/2018 00:37   Dg Femur Min 2 Views Right  Result Date: 10/21/2018 CLINICAL DATA:  Mechanical fall yesterday. EXAM: RIGHT FEMUR 2 VIEWS COMPARISON:  None. FINDINGS: Right total knee arthroplasty. There is a oblique periprosthetic fracture of the distal femoral metaphysis.  Medial displacement and overriding of the distal fracture fragments is present. Proximal femur and hip appear intact. Vascular calcifications. IMPRESSION: Oblique periprosthetic fracture of the distal right femoral metaphysis. Right total knee arthroplasty. Electronically Signed   By: Lucienne Capers M.D.   On: 10/21/2018 00:39    Impression/Recommendations  1. Complete heart block  - She reports ~1 month of occasional presyncope, reports actual syncope on 3/11 that resulted in fall with femur fracture, and was found to be in CHB at [facility], transferred to [facility] and is now s/p dual pacemaker implantation    2. Distal right femur fracture  - Orthopedic surgery consulting and planning for ORIF 10/22/18  - Based on the available data, Ms. Gagan presents an estimated 0.7% risk of perioperative MI or cardiac arrest per Melburn Hake al  - Continue pain-control, NPO after midnight, maintenance IVF, check early AM cortisol to determine need for increased steroid perioperatively     ADDENDUM: AM cortisol is <5, highly suggestive of impaired HPA axis. She should receive her usual am dose of prednisone, additional 50 mg IV hydrocortisone just prior to procedure, and then 25 mg IV hydrocortisone q8h x24 hrs.    3. COPD  - Recently treated for PNA and COPD exacerbation, continues to have productive cough, but reports otherwise back to baseline in terms of her chronic dyspnea  - Continue Duonebs    4. Type II DM  - No recent A1c on file  - Had been managed with Onglyza at her facility, held on admission  - Check CBG's and use a SSI with Novolog for now   5. RA  - Managed with tofacitinib and daily prednisone 5 mg, recently on increased steroids for COPD exacerbation  - Check early AM cortisol to determine HPA axis suppression and need for increased steroid perioperatively    6. Bipolar disorder  - Stable, med-rec pending    Thank you for this consultation.  Our [facility] hospitalist team will follow the patient  with you.   Time Spent: 62 minutes.   Ilene Qua  M.D. [facility] 10/21/2018, 8:13 PM

## 2018-10-21 NOTE — ED Notes (Signed)
Pt HR in the 20's, MD Quentin Cornwall aware. Pt placed on zoll pads. Repeat EKG performed.

## 2018-10-21 NOTE — Consult Note (Signed)
Name: Meghan [name] MRN: [id number] DOB: 27-Dec-1945    ADMISSION DATE:  10/20/2018 CONSULTATION DATE:  10/21/2018  REFERRING MD :  Dr. Sidney Ace  CHIEF COMPLAINT:  Fall  BRIEF PATIENT DESCRIPTION:  73 year old female who presented status post fall secondary to syncopal episode, found to have fracture of the right distal femur.  She was also noted to be in complete heart block.  Cardiology and orthopedics are following.  Awaiting placement of transvenous pacer versus permanent pacer.  SIGNIFICANT EVENTS  10/21/2018>> admission to [facility] ICU  STUDIES:  CT head 10/20/18>> negative for any acute intracranial abnormality  CULTURES: Urine 3/13>>  ANTIBIOTICS: Macrobid 3/13>>  HISTORY OF PRESENT ILLNESS:   This is Meghan [name] is a 73 year old female with a past medical history as listed below resented to [facility] ED status post fall secondary to syncopal episode.  Reports that she tripped over her walker in the bathroom at her skilled nursing facility, however she does not have recollection of the event.  She denies any chest pain or palpitations.  She does report lightheadedness.  While in the ED she was noted to be in complete heart block.  CT scan of the head is negative, and x-ray of the right knee and femur show oblique fracture of the distal right femoral metaphysis.  Troponin is negative, WBCs 10, urinalysis is positive for UTI.  Cardiology and orthopedics have been consulted. She is admitted to [facility] ICU for treatment of syncope secondary to complete heart block, right femur fracture, and UTI.  PCCM is consulted for further management.  She is a currently awaiting placement of transvenous pacer versus permanent pacer.  PAST MEDICAL HISTORY :   has a past medical history of Anginal pain ([place]), Anxiety, Arthritis, Asthma, Brain tumor (benign) (HCC), CHF (congestive heart failure) ([employer] [place]), Collagen vascular disease ([place]), COPD (chronic obstructive pulmonary disease) ([place]), Coronary artery disease, DDD  (degenerative disc disease), Depression, Diabetes mellitus, GERD (gastroesophageal reflux disease), Headache, Heart murmur, Heart murmur, Hypercholesteremia, Hypertension, Lumbar degenerative disc disease, Migraines, Obesity, Osteopenia, Osteoporosis, Pneumonia, and PTSD (post-traumatic stress disorder).  has a past surgical history that includes Hand reconstruction (2011); Abdominal hysterectomy; Cervical fusion; Joint replacement; Cholecystectomy; Foot arthroplasty; Finger arthroplasty (02/23/2012); Colonoscopy with propofol (N/A, 09/20/2015); Colonoscopy with propofol (N/A, 01/27/2016); Kyphoplasty (N/A, 06/14/2017); and LOOP RECORDER INSERTION (N/A, 06/23/2017). Prior to Admission medications   Medication Sig Start Date End Date Taking? Authorizing Provider  acetaminophen (TYLENOL) 325 MG tablet Take 650 mg by mouth every 6 (six) hours as needed.   Yes [provider]  busPIRone (BUSPAR) 7.5 MG tablet Take 1 tablet by mouth 2 (two) times daily.   Yes [provider]  Cholecalciferol 5000 units TABS Take 5,000 Units by mouth every 30 (thirty) days.   Yes [provider]  clonazePAM (KLONOPIN) 0.5 MG tablet Take 0.125 mg by mouth 2 (two) times daily.   Yes [provider]  cyanocobalamin 500 MCG tablet Take 500 mcg by mouth daily.   Yes [provider]  diclofenac sodium (VOLTAREN) 1 % GEL Apply 2 g topically 3 (three) times daily. Apply to the fingers   Yes [provider]  diphenoxylate-atropine (LOMOTIL) 2.5-0.025 MG tablet Take 1 tablet by mouth every 6 (six) hours as needed for diarrhea or loose stools.    Yes [provider]  escitalopram (LEXAPRO) 10 MG tablet Take 1 tablet (10 mg total) by mouth every morning. Patient taking differently: Take 20 mg by mouth every morning.  09/11/16  Yes Rainey Pines, MD  fluticasone (VERAMYST) 27.5 MCG/SPRAY nasal spray Place 2 sprays into the nose 2 (two) times daily.   Yes [provider]   folic acid (FOLVITE) 1 MG tablet Take 1 mg by mouth daily.   Yes [provider]  furosemide (LASIX) 40 MG tablet Take 40 mg by mouth 2 (two) times daily.   Yes [provider]  Ipratropium-Albuterol (COMBIVENT RESPIMAT) 20-100 MCG/ACT AERS respimat Inhale 2 puffs into the lungs every 6 (six) hours as needed for wheezing.   Yes [provider]  Ipratropium-Albuterol (COMBIVENT RESPIMAT) 20-100 MCG/ACT AERS respimat Inhale 2 puffs into the lungs every 6 (six) hours.   Yes [provider]  lamoTRIgine (LAMICTAL) 100 MG tablet Take 1 tablet (100 mg total) by mouth daily. 06/02/16  Yes Rainey Pines, MD  lisinopril (PRINIVIL,ZESTRIL) 2.5 MG tablet Take 1 tablet by mouth 2 (two) times daily.   Yes [provider]  loperamide (IMODIUM A-D) 2 MG tablet Take 1 tablet (2 mg total) by mouth 4 (four) times daily as needed for diarrhea or loose stools. Patient taking differently: Take 2 mg by mouth as needed for diarrhea or loose stools.  05/10/17  Yes Eula Listen, MD  loratadine (CLARITIN) 10 MG tablet Take 10 mg by mouth daily.   Yes [provider]  metoprolol tartrate (LOPRESSOR) 25 MG tablet Take 25 mg by mouth 2 (two) times daily.   Yes [provider]  mirtazapine (REMERON) 7.5 MG tablet Take 7.5 mg by mouth at bedtime.    Yes [provider]  montelukast (SINGULAIR) 10 MG tablet Take 10 mg by mouth daily.   Yes [provider]  omeprazole (PRILOSEC) 20 MG capsule Take 20 mg by mouth daily.    Yes [provider]  oxyCODONE-acetaminophen (PERCOCET/ROXICET) 5-325 MG tablet Take 1 tablet every 4 (four) hours as needed by mouth for moderate pain or severe pain. 06/15/17  Yes Henreitta Leber, MD  Potassium Chloride ER 20 MEQ TBCR Take 20 mEq by mouth 2 (two) times daily. 05/07/17  Yes Vaughan Basta, MD  pramipexole (MIRAPEX) 0.25 MG tablet Take 0.25 mg by mouth daily.    Yes [provider]   predniSONE (DELTASONE) 5 MG tablet Take 5 mg by mouth daily with breakfast.   Yes [provider]  pregabalin (LYRICA) 50 MG capsule TAKE ONE CAPSULE BY MOUTH TWICE A DAY 06/18/15  Yes [provider]  saxagliptin HCl (ONGLYZA) 5 MG TABS tablet Take 5 mg by mouth at bedtime.   Yes [provider]  Tofacitinib Citrate 5 MG TABS Take 5 mg by mouth 2 (two) times daily after a meal.    Yes [provider]  topiramate (TOPAMAX) 25 MG tablet Take 25 mg by mouth at bedtime.    Yes [provider]  cefUROXime (CEFTIN) 500 MG tablet Take 1 tablet (500 mg total) by mouth 2 (two) times daily with a meal. Patient not taking: Reported on 10/21/2018 08/28/17   Bettey Costa, MD  methotrexate (RHEUMATREX) 7.5 MG tablet Take 7.5 mg by mouth once a week. Caution" Chemotherapy. Protect from light.    [provider]  promethazine (PHENERGAN) 25 MG suppository Place 1 suppository (25 mg total) rectally every 6 (six) hours as needed for nausea. 05/10/17 05/10/18  Eula Listen, MD   Allergies  Allergen Reactions  . Amoxicillin Itching and Other (See Comments)    Has patient had a PCN reaction causing immediate rash, facial/tongue/throat swelling, SOB or lightheadedness with hypotension: Unknown Has  patient had a PCN reaction causing severe rash involving mucus membranes or skin necrosis: Unknown Has patient had a PCN reaction that required hospitalization: Unknown Has patient had a PCN reaction occurring within the last 10 years: Unknown If all of the above answers are "NO", then may proceed with Cephalosporin use.   . Gabapentin Other (See Comments)    Pt states that it causes her BP to drop.   . Naproxen Hives  . Nsaids Other (See Comments)    Reaction:  Unknown     FAMILY HISTORY:  family history includes Arthritis in an other family member; Arthritis/Rheumatoid in her mother; CAD in an other family member; Depression in her sister; Diabetes Mellitus  II in an other family member; Heart attack in her father; Hypertension in her father, mother, and another family member; Migraines in her sister. SOCIAL HISTORY:  reports that she has never smoked. She has never used smokeless tobacco. She reports that she does not drink alcohol or use drugs.  REVIEW OF SYSTEMS:  :Positives in BOLD Constitutional: Negative for fever, chills, weight loss, malaise/fatigue and diaphoresis.  HENT: Negative for hearing loss, ear pain, nosebleeds, congestion, sore throat, neck pain, tinnitus and ear discharge.   Eyes: Negative for blurred vision, double vision, photophobia, pain, discharge and redness.  Respiratory: Negative for cough, hemoptysis, sputum production, shortness of breath, wheezing and stridor.   Cardiovascular: Negative for chest pain, palpitations, orthopnea, claudication, leg swelling and PND.  Gastrointestinal: Negative for heartburn, +nausea, vomiting, abdominal pain, diarrhea, constipation, blood in stool and melena.  Genitourinary: Negative for dysuria, urgency, frequency, hematuria and flank pain.  Musculoskeletal: Negative for myalgias, back pain, joint pain and falls. +Right Leg pain  Skin: Negative for itching and rash.  Neurological: Negative for dizziness, tingling, tremors, sensory change, speech change, focal weakness, seizures, loss of consciousness, weakness and headaches.  Endo/Heme/Allergies: Negative for environmental allergies and polydipsia. Does not bruise/bleed easily.  SUBJECTIVE:  Reports nausea and pain in RLE Denies chest pain, shortness of breath, fever/chills On room air  VITAL SIGNS: Temp:  [98.6 F (37 C)] 98.6 F (37 C) (03/12 2310) Pulse Rate:  [34-47] 47 (03/13 0400) Resp:  [10-18] 14 (03/13 0400) BP: (98-127)/(39-110) 108/49 (03/13 0400) SpO2:  [93 %-100 %] 97 % (03/13 0400) Weight:  [69.9 kg] 69.9 kg (03/12 2311)  PHYSICAL EXAMINATION: General:  Acutely ill appearing female, laying in bed, on room air, in  NAD Neuro:  Sleeping, arouses to voice, A&O x3 (disoriented to place), follows commands, no focal deficits HEENT:  Large bruise to left temporal region, normocephalic, neck supple, no JVD Cardiovascular:  Bradycardia, s1s2, no M/R/G Lungs:  Clear to auscultation bilaterally, even, nonlabored, normal effort Abdomen:  Obese, soft, nontender, nondistended, no guarding or rebound tenderness, BS+ x4 Musculoskeletal:  Right leg in knee immobilzer, no edema Skin:  Warm/dry.  No obvious rashes, lesions, or ulcerations  Recent Labs  Lab 10/21/18 0100  NA 140  K 3.7  CL 108  CO2 22  BUN 15  CREATININE 0.81  GLUCOSE 91   Recent Labs  Lab 10/21/18 0100  HGB 11.2*  HCT 34.2*  WBC 10.0  PLT 262   Ct Head Wo Contrast  Result Date: 10/20/2018 CLINICAL DATA:  Mechanical fall yesterday hitting head. EXAM: CT HEAD WITHOUT CONTRAST TECHNIQUE: Contiguous axial images were obtained from the base of the skull through the vertex without intravenous contrast. COMPARISON:  04/09/2017 FINDINGS: Brain: Right cerebellar encephalomalacia status post suboccipital craniectomy. Mild age related involutional changes of  brain appear stable otherwise. Chronic microvascular ischemic disease of periventricular white matter with chronic right basal ganglial lacunar infarcts are noted. Midline fourth ventricle basal cisterns without effacement. No intra-axial mass nor extra-axial collection. Vascular: Atherosclerosis of the carotid siphons. Skull: No acute skull fracture.  Suboccipital craniectomy on right. Sinuses/Orbits: Intact orbits and globes. Mild ethmoid sinus mucosal thickening. Frothy mucous in the left sphenoid. Maxillary sinuses are clear to the extent included. Other: No significant soft tissue swelling. IMPRESSION: 1. No acute intracranial abnormality. 2. Chronic moderate small vessel ischemia and right basal ganglial lacunar infarct. 3. Status post right suboccipital craniectomy with right cerebellar  encephalomalacia. Electronically Signed   By: Ashley Royalty M.D.   On: 10/20/2018 23:56   Dg Chest Portable 1 View  Result Date: 10/21/2018 CLINICAL DATA:  Patient fell.  Evaluate for fracture. EXAM: PORTABLE CHEST 1 VIEW COMPARISON:  08/26/2017 FINDINGS: AP supine view of the chest. 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 The patient is status post L1 kyphoplasty. Thoracic spondylosis with gentle dextroconvex curvature of the thoracic spine is again noted without change. Multilevel degenerative disc disease with endplate spurring is noted. Loop recording device projects over the left hemithorax. Stable borderline cardiomegaly. No acute pulmonary consolidation or overt pulmonary edema. Aortic atherosclerosis without aneurysm. No acute osseous appearing abnormality. IMPRESSION: Chronic elevation and/or eventration of the right hemidiaphragm. Stable borderline cardiomegaly with aortic atherosclerosis. Electronically Signed   By: Ashley Royalty M.D.   On: 10/21/2018 00:37   Dg Femur Min 2 Views Right  Result Date: 10/21/2018 CLINICAL DATA:  Mechanical fall yesterday. EXAM: RIGHT FEMUR 2 VIEWS COMPARISON:  None. FINDINGS: Right total knee arthroplasty. There is a oblique periprosthetic fracture of the distal femoral metaphysis. Medial displacement and overriding of the distal fracture fragments is present. Proximal femur and hip appear intact. Vascular calcifications. IMPRESSION: Oblique periprosthetic fracture of the distal right femoral metaphysis. Right total knee arthroplasty. Electronically Signed   By: Lucienne Capers M.D.   On: 10/21/2018 00:39    ASSESSMENT / PLAN:  Syncope secondary to third-degree heart block -Cardiac monitoring -Trend Troponin -Cardiology consulted, appreciate input.  ED physician spoke with Dr. Nehemiah Massed, plan for transvenous pacer; likely will need permanent pacemaker -PRN IV atropine as needed -Dopamine  infusion if needed if pt becomes hypotensive -Echocardiogram pending -Check TSH -Hold home metoprolol  Right femur fracture s/p Fall -Orthopedics consulted, appreciate input -Right leg to remain in knee immobilizer  -Pain management  UTI -Monitor fever curve -Trend WBC's -Follow urine culture -Place on Macrobid (pt has a PCN allergy)  COPD without acute exacerbation -Supplemental O2 as needed to maintain O2 sats 88 to 92% -Follow intermittent chest x-ray and ABG as needed -Continue bronchodilators  Anemia without signs of active bleeding -Monitor for S/Sx of bleeding -Trend CBC -SCDs for VTE Prophylaxis  -Transfuse for Hgb <7    DISPOSITION: ICU GOALS OF CARE: FULL CODE VTE PROPHYLAXIS: SCD'S (holding chemical prophylaxis at this time given pending surgical interventions) UPDATES: UPDATED PT AT BEDSIDE 10/21/18   Darel Hong, AGACNP-BC [place] Pager: [phone] Cell: [phone]  10/21/2018, 5:30 AM

## 2018-10-21 NOTE — H&P (Addendum)
ELECTROPHYSIOLOGY CONSULT NOTE    Patient ID: Meghan [name] MRN: [id number], DOB/AGE: [id number] y.o.  Admit date: 10/21/2018 Date of Consult: 10/21/2018  Primary Physician: Donato Schultz, MD  Primary Cardiologist: [facility]  Electrophysiologist: Caryl Comes (new this admission)  Patient Profile: Meghan [name] is a 73 y.o. female with a history of CAD, HTN, hyperlipidemia, COPD, and recurrent syncope who is being seen today for the evaluation of complete heart block at the request of Dr Erin Fulling.  HPI:  Meghan [name] is a 73 y.o. female with the above past medical history. She has had recurrent syncope dating back to 2018. She had loop recorder placed at that time but does not appear to have been monitored remotely. Yesterday, she was at her nursing home and got up from the toilet. She was pulling up her briefs and remembers awakening on the floor. She had severe leg pain at that time. EMS was called and she was found to be in complete heart block. She also has a femur fracture. She was placed on dopamine and transferred to [facility] for evaluation of pacemaker implant. Echocardiogram was done today but not yet read. Loop recorder interrogation demonstrates multiple (>700) episodes of pauses and heart block dating back to 06/2017. Also noted 3 episodes of atrial fibrillation, the longest episode <2 hours.   She currently denies chest pain, palpitations, dyspnea, PND, orthopnea, nausea, vomiting, dizziness, syncope, edema, weight gain, or early satiety. She is in significant pain.   Past Medical History:  Diagnosis Date  . Anginal pain ([place])   . Anxiety   . Arthritis    RA  . Asthma   . Brain tumor (benign) ([place])   . CHF (congestive heart failure) ([place])   . Collagen vascular disease ([place])   . COPD (chronic obstructive pulmonary disease) ([place])   . Coronary artery disease   . DDD (degenerative disc disease)   . Depression   . Diabetes mellitus   . GERD (gastroesophageal reflux disease)   .  Headache   . Heart murmur   . Heart murmur   . Hypercholesteremia   . Hypertension   . Lumbar degenerative disc disease   . Migraines   . Obesity   . Osteopenia   . Osteoporosis   . Pneumonia   . PTSD (post-traumatic stress disorder)      Surgical History:  Past Surgical History:  Procedure Laterality Date  . ABDOMINAL HYSTERECTOMY    . CERVICAL FUSION    . CHOLECYSTECTOMY    . COLONOSCOPY WITH PROPOFOL N/A 09/20/2015   Procedure: COLONOSCOPY WITH PROPOFOL;  Surgeon: Josefine Class, MD;  Location[facility] ENDOSCOPY;  Service: Endoscopy;  Laterality: N/A;  . COLONOSCOPY WITH PROPOFOL N/A 01/27/2016   Procedure: COLONOSCOPY WITH PROPOFOL;  Surgeon: Manya Silvas, MD;  Location: [facility] ENDOSCOPY;  Service: Endoscopy;  Laterality: N/A;  . FINGER ARTHROPLASTY  02/23/2012   Procedure: FINGER ARTHROPLASTY;  Surgeon: Cammie Sickle., MD;  Location: [place];  Service: Orthopedics;  Laterality: Left;  Extensor carpi radialis longus to Extensor carpi ulnaris transfer, left Metaphalangeal reconstructions of index and long fingers,  . FOOT ARTHROPLASTY     toes x2 rt foot  . HAND RECONSTRUCTION  2011   right-multiple finger joint reconst  . JOINT REPLACEMENT     bilat knee replacements  . KYPHOPLASTY N/A 06/14/2017   Procedure: KYPHOPLASTY L1;  Surgeon: Hessie Knows, MD;  Location: [facility] ORS;  Service: Orthopedics;  Laterality: N/A;  . LOOP  RECORDER INSERTION N/A 06/23/2017   Procedure: LOOP RECORDER INSERTION;  Surgeon: Isaias Cowman, MD;  Location: [place] CV LAB;  Service: Cardiovascular;  Laterality: N/A;     Medications Prior to Admission  Medication Sig Dispense Refill Last Dose  . acetaminophen (TYLENOL) 325 MG tablet Take 650 mg by mouth every 6 (six) hours as needed.   10/20/2018 at Unknown time  . atropine 1 MG/10ML injection Inject 10 mLs (1 mg total) into the vein as needed (give for sustained HR <30). 10 mL    . busPIRone (BUSPAR) 7.5 MG  tablet Take 1 tablet by mouth 2 (two) times daily.   10/20/2018 at Unknown time  . Cholecalciferol 5000 units TABS Take 5,000 Units by mouth every 30 (thirty) days.   Past Month at Unknown time  . clonazePAM (KLONOPIN) 0.5 MG tablet Take 0.125 mg by mouth 2 (two) times daily.   10/20/2018 at Unknown time  . cyanocobalamin 500 MCG tablet Take 500 mcg by mouth daily.   10/20/2018 at Unknown time  . diclofenac sodium (VOLTAREN) 1 % GEL Apply 2 g topically 3 (three) times daily. Apply to the fingers   10/20/2018 at Unknown time  . diphenoxylate-atropine (LOMOTIL) 2.5-0.025 MG tablet Take 1 tablet by mouth every 6 (six) hours as needed for diarrhea or loose stools.    prn at prn  . DOPamine (INTROPIN) 3.2-5 MG/ML-% infusion Inject 0-1,390 mcg/min into the vein continuous. 250 mL 0   . escitalopram (LEXAPRO) 10 MG tablet Take 1 tablet (10 mg total) by mouth every morning. (Patient taking differently: Take 20 mg by mouth every morning. ) 90 tablet 1 10/20/2018 at Unknown time  . fluticasone (VERAMYST) 27.5 MCG/SPRAY nasal spray Place 2 sprays into the nose 2 (two) times daily.   10/20/2018 at Unknown time  . folic acid (FOLVITE) 1 MG tablet Take 1 mg by mouth daily.   10/20/2018 at Unknown time  . Ipratropium-Albuterol (COMBIVENT RESPIMAT) 20-100 MCG/ACT AERS respimat Inhale 2 puffs into the lungs every 6 (six) hours.   10/20/2018 at Unknown time  . lamoTRIgine (LAMICTAL) 100 MG tablet Take 1 tablet (100 mg total) by mouth daily. 90 tablet 3 10/20/2018 at Unknown time  . loperamide (IMODIUM A-D) 2 MG tablet Take 1 tablet (2 mg total) by mouth 4 (four) times daily as needed for diarrhea or loose stools. (Patient taking differently: Take 2 mg by mouth as needed for diarrhea or loose stools. ) 12 tablet 0 prn at prn  . loratadine (CLARITIN) 10 MG tablet Take 10 mg by mouth daily.   10/20/2018 at Unknown time  . methotrexate (RHEUMATREX) 7.5 MG tablet Take 7.5 mg by mouth once a week. Caution" Chemotherapy. Protect from  light.   unknown at unknown  . mirtazapine (REMERON) 7.5 MG tablet Take 7.5 mg by mouth at bedtime.    10/20/2018 at Unknown time  . montelukast (SINGULAIR) 10 MG tablet Take 10 mg by mouth daily.   10/20/2018 at Unknown time  . morphine 2 MG/ML injection Inject 1 mL (2 mg total) into the vein every 2 (two) hours as needed (p[ain). 1 mL 0   . omeprazole (PRILOSEC) 20 MG capsule Take 20 mg by mouth daily.    10/20/2018 at Unknown time  . oxyCODONE-acetaminophen (PERCOCET/ROXICET) 5-325 MG tablet Take 1 tablet every 4 (four) hours as needed by mouth for moderate pain or severe pain. 30 tablet 0 prn at prn  . Potassium Chloride ER 20 MEQ TBCR Take 20 mEq by mouth  2 (two) times daily. 30 tablet 0 10/20/2018 at Unknown time  . pramipexole (MIRAPEX) 0.25 MG tablet Take 0.25 mg by mouth daily.    10/20/2018 at Unknown time  . predniSONE (DELTASONE) 5 MG tablet Take 5 mg by mouth daily with breakfast.   10/20/2018 at Unknown time  . pregabalin (LYRICA) 50 MG capsule TAKE ONE CAPSULE BY MOUTH TWICE A DAY   10/20/2018 at Unknown time  . saxagliptin HCl (ONGLYZA) 5 MG TABS tablet Take 5 mg by mouth at bedtime.   10/20/2018 at Unknown time  . Tofacitinib Citrate 5 MG TABS Take 5 mg by mouth 2 (two) times daily after a meal.    10/20/2018 at Unknown time  . topiramate (TOPAMAX) 25 MG tablet Take 25 mg by mouth at bedtime.    10/20/2018 at Unknown time    Inpatient Medications:   Allergies:  Allergies  Allergen Reactions  . Amoxicillin Itching and Other (See Comments)    Has patient had a PCN reaction causing immediate rash, facial/tongue/throat swelling, SOB or lightheadedness with hypotension: Unknown Has patient had a PCN reaction causing severe rash involving mucus membranes or skin necrosis: Unknown Has patient had a PCN reaction that required hospitalization: Unknown Has patient had a PCN reaction occurring within the last 10 years: Unknown If all of the above answers are "NO", then may proceed with  Cephalosporin use.   . Gabapentin Other (See Comments)    Pt states that it causes her BP to drop.   . Naproxen Hives  . Nsaids Other (See Comments)    Reaction:  Unknown     Social History   Socioeconomic History  . Marital status: Divorced    Spouse name: Not on file  . Number of children: Not on file  . Years of education: Not on file  . Highest education level: Not on file  Occupational History  . Occupation: retired  Scientific laboratory technician  . Financial resource strain: Not on file  . Food insecurity:    Worry: Not on file    Inability: Not on file  . Transportation needs:    Medical: Not on file    Non-medical: Not on file  Tobacco Use  . Smoking status: Never Smoker  . Smokeless tobacco: Never Used  Substance and Sexual Activity  . Alcohol use: No    Alcohol/week: 0.0 standard drinks  . Drug use: No  . Sexual activity: Never  Lifestyle  . Physical activity:    Days per week: Not on file    Minutes per session: Not on file  . Stress: Not on file  Relationships  . Social connections:    Talks on phone: Not on file    Gets together: Not on file    Attends religious service: Not on file    Active member of club or organization: Not on file    Attends meetings of clubs or organizations: Not on file    Relationship status: Not on file  . Intimate partner violence:    Fear of current or ex partner: Not on file    Emotionally abused: Not on file    Physically abused: Not on file    Forced sexual activity: Not on file  Other Topics Concern  . Not on file  Social History Narrative  . Not on file     Family History  Problem Relation Age of Onset  . Depression Sister   . Migraines Sister   . Hypertension Mother   . Arthritis/Rheumatoid  Mother   . Heart attack Father   . Hypertension Father   . CAD Other   . Hypertension Other   . Diabetes Mellitus II Other   . Arthritis Other      Review of Systems: All other systems reviewed and are otherwise negative except  as noted above.  Physical Exam: There were no vitals filed for this visit.  GEN- The patient is elderly appearing, alert and oriented x 3 today.   HEENT: normocephalic, atraumatic; sclera clear, conjunctiva pink; hearing intact; oropharynx clear; neck supple Lungs- Clear to ausculation bilaterally, normal work of breathing.  No wheezes, rales, rhonchi Heart- Bradycardic regular rate and rhythm  GI- soft, non-tender, non-distended, bowel sounds present Extremities- no clubbing, cyanosis, or edema, +right knee immobilizer MS- no significant deformity or atrophy Skin- warm and dry, no rash or lesion Psych- euthymic mood, full affect Neuro- strength and sensation are intact  Labs:   Lab Results  Component Value Date   WBC 10.4 10/21/2018   HGB 11.2 (L) 10/21/2018   HCT 34.9 (L) 10/21/2018   MCV 91.1 10/21/2018   PLT 240 10/21/2018    Recent Labs  Lab 10/21/18 0608  NA 141  K 3.7  CL 110  CO2 22  BUN 18  CREATININE 0.89  CALCIUM 7.7*  PROT 6.0*  BILITOT 1.3*  ALKPHOS 62  ALT 31  AST 27  GLUCOSE 96      Radiology/Studies: Ct Head Wo Contrast  Result Date: 10/20/2018 CLINICAL DATA:  Mechanical fall yesterday hitting head. EXAM: CT HEAD WITHOUT CONTRAST TECHNIQUE: Contiguous axial images were obtained from the base of the skull through the vertex without intravenous contrast. COMPARISON:  04/09/2017 FINDINGS: Brain: Right cerebellar encephalomalacia status post suboccipital craniectomy. Mild age related involutional changes of brain appear stable otherwise. Chronic microvascular ischemic disease of periventricular white matter with chronic right basal ganglial lacunar infarcts are noted. Midline fourth ventricle basal cisterns without effacement. No intra-axial mass nor extra-axial collection. Vascular: Atherosclerosis of the carotid siphons. Skull: No acute skull fracture.  Suboccipital craniectomy on right. Sinuses/Orbits: Intact orbits and globes. Mild ethmoid sinus mucosal  thickening. Frothy mucous in the left sphenoid. Maxillary sinuses are clear to the extent included. Other: No significant soft tissue swelling. IMPRESSION: 1. No acute intracranial abnormality. 2. Chronic moderate small vessel ischemia and right basal ganglial lacunar infarct. 3. Status post right suboccipital craniectomy with right cerebellar encephalomalacia. Electronically Signed   By: Ashley Royalty M.D.   On: 10/20/2018 23:56   Dg Chest Portable 1 View  Result Date: 10/21/2018 CLINICAL DATA:  Patient fell.  Evaluate for fracture. EXAM: PORTABLE CHEST 1 VIEW COMPARISON:  08/26/2017 FINDINGS: AP supine view of the chest. 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 The patient is status post L1 kyphoplasty. Thoracic spondylosis with gentle dextroconvex curvature of the thoracic spine is again noted without change. Multilevel degenerative disc disease with endplate spurring is noted. Loop recording device projects over the left hemithorax. Stable borderline cardiomegaly. No acute pulmonary consolidation or overt pulmonary edema. Aortic atherosclerosis without aneurysm. No acute osseous appearing abnormality. IMPRESSION: Chronic elevation and/or eventration of the right hemidiaphragm. Stable borderline cardiomegaly with aortic atherosclerosis. Electronically Signed   By: Ashley Royalty M.D.   On: 10/21/2018 00:37   Dg Femur Min 2 Views Right  Result Date: 10/21/2018 CLINICAL DATA:  Mechanical fall yesterday. EXAM: RIGHT FEMUR 2 VIEWS COMPARISON:  None. FINDINGS: Right total knee arthroplasty. There is a  oblique periprosthetic fracture of the distal femoral metaphysis. Medial displacement and overriding of the distal fracture fragments is present. Proximal femur and hip appear intact. Vascular calcifications. IMPRESSION: Oblique periprosthetic fracture of the distal right femoral metaphysis. Right total knee arthroplasty. Electronically Signed   By:  Lucienne Capers M.D.   On: 10/21/2018 00:39    AJO:INOMVEHM heart block(personally reviewed)  TELEMETRY: complete heart block (personally reviewed)  DEVICE HISTORY: MDT ILR implanted 2018 for syncope  Assessment/Plan: 1.  Complete heart block with syncope The patient presented with syncope and was found to have complete heart block Risks, benefits to pacemaker implant reviewed with patient who wishes to proceed. Will pan for later today Will remove loop recorder at the same time Echo read by Dr Meda Coffee (EF 60-65%)  2.  Right femur fracture Ortho has seen patient Plan for surgery tomorrow post pacemaker They request patient go to 5N post pacemaker implant  3.  SCAF Longest AF episode 1 hour 42 minutes CHADS2VASC is 5 Will follow burden remotely If AF burden increases, will need to consider [place]   I have spoken with Dr Lorin Mercy with [facility] who will accept patient onto their service once pacemaker implanted. She will see patient today to help with management.    For questions or updates, please contact [place] Please consult [email or web address] for contact info under Cardiology/STEMI.  Signed, Chanetta Marshall, NP 10/21/2018 2:43 PM  As above  Recurrent heart block now persisting with multiple falls and nursing home bound 2/2 to them, now w femur fracture  Needs pacing  The benefits and risks were reviewed including but not limited to death,  perforation, infection, lead dislodgement and device malfunction.  The patient understands agrees and is willing to proceed.

## 2018-10-21 NOTE — Progress Notes (Signed)
Called for this patient with h/o COPD, arthritis, HTN, DM, and osteoporosis presenting with complete heart block.  Dr. Caryl Comes has accepted the patient in transfer due to complete heart block.  Orthopedics will need to consult for femur fracture repair.  They have requested hospitalist admission.  I immediately called to discuss the patient with Dr. Sarajane Jews, the [facility] Director on Call.  The patient is on a dopamine drip with pacer pads in place.  Due to need for permanent pacemaker placement in the setting of complete heart block, this patient will need ICU level of care on 2H.  According to the charge nurse, Dr. Caryl Comes is the accepting physician for this patient.  Will defer the admission at this time since the patient will come to the cardiology service - and since we do not have admitting privileges for ICU level of care, regardless.  [facility] will be happy to consult after arrival, if desired.  I have called and left a voice mail for Dr. Caryl Comes with this information, as well.  Carlyon Shadow, M.D.

## 2018-10-21 NOTE — Progress Notes (Signed)
*  PRELIMINARY RESULTS* Echocardiogram 2D Echocardiogram has been performed.  Meghan [name] 10/21/2018, 11:34 AM

## 2018-10-21 NOTE — Discharge Summary (Signed)
[facility] NAME: Meghan [name]    MR#:  [id number]  DATE OF BIRTH:  1946-05-14  DATE OF ADMISSION:  10/20/2018   ADMITTING PHYSICIAN: Arta Silence, MD  DATE OF DISCHARGE:  10/21/18  PRIMARY CARE PHYSICIAN: Donato Schultz, MD   ADMISSION DIAGNOSIS:   Complete heart block ([place]) [I44.2] Closed bicondylar fracture of right femur, initial encounter ([place]) [S72.421A, S72.431A]  DISCHARGE DIAGNOSIS:   Active Problems:   Complete heart block ([place])   SECONDARY DIAGNOSIS:   Past Medical History:  Diagnosis Date  . Anginal pain ([place])   . Anxiety   . Arthritis    RA  . Asthma   . Brain tumor (benign) ([place])   . CHF (congestive heart failure) ([place])   . Collagen vascular disease ([place])   . COPD (chronic obstructive pulmonary disease) ([place])   . Coronary artery disease   . DDD (degenerative disc disease)   . Depression   . Diabetes mellitus   . GERD (gastroesophageal reflux disease)   . Headache   . Heart murmur   . Heart murmur   . Hypercholesteremia   . Hypertension   . Lumbar degenerative disc disease   . Migraines   . Obesity   . Osteopenia   . Osteoporosis   . Pneumonia   . PTSD (post-traumatic stress disorder)     HOSPITAL COURSE:   73 year old female with past medical history secondary to COPD, arthritis, hypertension, diabetes, osteoporosis comes from [place] secondary to a syncopal episode and fall.  1.  Complete heart block-patient noted to be in complete heart block based on EKG here and she was symptomatic with a syncopal episode -Seen by cardiology.  Heart rate in the 30s, soft blood pressures.  Pacer pads on -Discussed with cardiology at [facility] needs to be transferred to [place] for permanent pacemaker placement. -Starting on dopamine drip.  Discussed with cardiologist year for temporary pacemaker placement prior to transfer. -patient is n.p.o.  Not on any anticoagulation.  2.   Mechanical fall and right distal femoral fracture-patient had bilateral knee replacement surgeries done.  Secondary to the fall today, she has a periprosthetic right femoral fracture on the x-ray -Currently in an immobilizer.  Seems to be in severe pain. -Discussed with orthopedics at [facility], spoke with Hilbert Odor, PA who has accepted to see the patient in consultation for surgery.  3. Anxiety and depression-continue home medications-BuSpar, Remeron, Klonopin and Topamax  4.  Diabetes mellitus-patient on Onglyza  5.  COPD-stable.  Currently on supplemental oxygen.  Continue inhalers.  Awaiting to hear back from [facility] hospitalist to accept the patient prior to transfer.  DISCHARGE CONDITIONS:   Critical  CONSULTS OBTAINED:   Treatment Team:  Earnestine Leys, MD  DRUG ALLERGIES:   Allergies  Allergen Reactions  . Amoxicillin Itching and Other (See Comments)    Has patient had a PCN reaction causing immediate rash, facial/tongue/throat swelling, SOB or lightheadedness with hypotension: Unknown Has patient had a PCN reaction causing severe rash involving mucus membranes or skin necrosis: Unknown Has patient had a PCN reaction that required hospitalization: Unknown Has patient had a PCN reaction occurring within the last 10 years: Unknown If all of the above answers are "NO", then may proceed with Cephalosporin use.   . Gabapentin Other (See Comments)    Pt states that it causes her BP to drop.   . Naproxen Hives  . Nsaids Other (See Comments)  Reaction:  Unknown    DISCHARGE MEDICATIONS:   Allergies as of 10/21/2018      Reactions   Amoxicillin Itching, Other (See Comments)   Has patient had a PCN reaction causing immediate rash, facial/tongue/throat swelling, SOB or lightheadedness with hypotension: Unknown Has patient had a PCN reaction causing severe rash involving mucus membranes or skin necrosis: Unknown Has patient had a PCN reaction that required  hospitalization: Unknown Has patient had a PCN reaction occurring within the last 10 years: Unknown If all of the above answers are "NO", then may proceed with Cephalosporin use.   Gabapentin Other (See Comments)   Pt states that it causes her BP to drop.    Naproxen Hives   Nsaids Other (See Comments)   Reaction:  Unknown       Medication List    STOP taking these medications   cefUROXime 500 MG tablet Commonly known as:  Ceftin   furosemide 40 MG tablet Commonly known as:  LASIX   lisinopril 2.5 MG tablet Commonly known as:  PRINIVIL,ZESTRIL   metoprolol tartrate 25 MG tablet Commonly known as:  LOPRESSOR   promethazine 25 MG suppository Commonly known as:  Phenergan     TAKE these medications   acetaminophen 325 MG tablet Commonly known as:  TYLENOL Take 650 mg by mouth every 6 (six) hours as needed.   atropine 1 MG/10ML injection Inject 10 mLs (1 mg total) into the vein as needed (give for sustained HR <30).   busPIRone 7.5 MG tablet Commonly known as:  BUSPAR Take 1 tablet by mouth 2 (two) times daily.   Cholecalciferol 125 MCG (5000 UT) Tabs Take 5,000 Units by mouth every 30 (thirty) days.   clonazePAM 0.5 MG tablet Commonly known as:  KLONOPIN Take 0.125 mg by mouth 2 (two) times daily.   Combivent Respimat 20-100 MCG/ACT Aers respimat Generic drug:  Ipratropium-Albuterol Inhale 2 puffs into the lungs every 6 (six) hours. What changed:  Another medication with the same name was removed. Continue taking this medication, and follow the directions you see here.   diclofenac sodium 1 % Gel Commonly known as:  VOLTAREN Apply 2 g topically 3 (three) times daily. Apply to the fingers   diphenoxylate-atropine 2.5-0.025 MG tablet Commonly known as:  LOMOTIL Take 1 tablet by mouth every 6 (six) hours as needed for diarrhea or loose stools.   DOPamine 3.2-5 MG/ML-% infusion Commonly known as:  INTROPIN Inject 0-1,390 mcg/min into the vein continuous.    escitalopram 10 MG tablet Commonly known as:  LEXAPRO Take 1 tablet (10 mg total) by mouth every morning. What changed:  how much to take   fluticasone 27.5 MCG/SPRAY nasal spray Commonly known as:  VERAMYST Place 2 sprays into the nose 2 (two) times daily.   folic acid 1 MG tablet Commonly known as:  FOLVITE Take 1 mg by mouth daily.   lamoTRIgine 100 MG tablet Commonly known as:  LAMICTAL Take 1 tablet (100 mg total) by mouth daily.   loperamide 2 MG tablet Commonly known as:  IMODIUM A-D Take 1 tablet (2 mg total) by mouth 4 (four) times daily as needed for diarrhea or loose stools. What changed:  when to take this   loratadine 10 MG tablet Commonly known as:  CLARITIN Take 10 mg by mouth daily.   Lyrica 50 MG capsule Generic drug:  pregabalin TAKE ONE CAPSULE BY MOUTH TWICE A DAY   methotrexate 7.5 MG tablet Commonly known as:  RHEUMATREX Take 7.5  mg by mouth once a week. Caution" Chemotherapy. Protect from light.   mirtazapine 7.5 MG tablet Commonly known as:  REMERON Take 7.5 mg by mouth at bedtime.   montelukast 10 MG tablet Commonly known as:  SINGULAIR Take 10 mg by mouth daily.   morphine 2 MG/ML injection Inject 1 mL (2 mg total) into the vein every 2 (two) hours as needed (p[ain).   omeprazole 20 MG capsule Commonly known as:  PRILOSEC Take 20 mg by mouth daily.   Onglyza 5 MG Tabs tablet Generic drug:  saxagliptin HCl Take 5 mg by mouth at bedtime.   oxyCODONE-acetaminophen 5-325 MG tablet Commonly known as:  PERCOCET/ROXICET Take 1 tablet every 4 (four) hours as needed by mouth for moderate pain or severe pain.   Potassium Chloride ER 20 MEQ Tbcr Take 20 mEq by mouth 2 (two) times daily.   pramipexole 0.25 MG tablet Commonly known as:  MIRAPEX Take 0.25 mg by mouth daily.   predniSONE 5 MG tablet Commonly known as:  DELTASONE Take 5 mg by mouth daily with breakfast.   Tofacitinib Citrate 5 MG Tabs Take 5 mg by mouth 2 (two) times  daily after a meal.   topiramate 25 MG tablet Commonly known as:  TOPAMAX Take 25 mg by mouth at bedtime.   vitamin B-12 500 MCG tablet Commonly known as:  CYANOCOBALAMIN Take 500 mcg by mouth daily.        DISCHARGE INSTRUCTIONS:   1.  Follow-up with cardiology and orthopedics at [facility]  DIET:   NPO for now  ACTIVITY:   Bedrest  OXYGEN:   Home Oxygen: Yes.    Oxygen Delivery: 2 liters/min via Patient connected to nasal cannula oxygen  DISCHARGE LOCATION:   [facility]     If you experience worsening of your admission symptoms, develop shortness of breath, life threatening emergency, suicidal or homicidal thoughts you must seek medical attention immediately by calling 911 or calling your MD immediately  if symptoms less severe.  You Must read complete instructions/literature along with all the possible adverse reactions/side effects for all the Medicines you take and that have been prescribed to you. Take any new Medicines after you have completely understood and accpet all the possible adverse reactions/side effects.   Please note  You were cared for by a hospitalist during your hospital stay. If you have any questions about your discharge medications or the care you received while you were in the hospital after you are discharged, you can call the unit and asked to speak with the hospitalist on call if the hospitalist that took care of you is not available. Once you are discharged, your primary care physician will handle any further medical issues. Please note that NO REFILLS for any discharge medications will be authorized once you are discharged, as it is imperative that you return to your primary care physician (or establish a relationship with a primary care physician if you do not have one) for your aftercare needs so that they can reassess your need for medications and monitor your lab values.    On the day of Discharge:  VITAL SIGNS:    Blood pressure 93/74, pulse (!) 50, temperature 98.9 F (37.2 C), resp. rate (!) 21, height 4\' 7"  (1.397 m), weight 69.5 kg, SpO2 99 %.  PHYSICAL EXAMINATION:    GENERAL:  73 y.o.-year-old patient lying in the bed, in acute distress due to pain.  EYES: Pupils equal, round, reactive to light  and accommodation. No scleral icterus. Extraocular muscles intact.  HEENT: Head atraumatic, normocephalic. Oropharynx and nasopharynx clear.  NECK:  Supple, no jugular venous distention. No thyroid enlargement, no tenderness.  LUNGS: Normal breath sounds bilaterally, no wheezing, rales,rhonchi or crepitation. No use of accessory muscles of respiration.  Decreased bibasilar breath sounds CARDIOVASCULAR: S1, S2 slow rate. No murmurs, rubs, or gallops.  ABDOMEN: Soft, non-tender, non-distended. Bowel sounds present. No organomegaly or mass.  EXTREMITIES: No cyanosis, or clubbing.  Right leg in an immobilizer NEUROLOGIC: Cranial nerves II through XII are intact. Except the right lower extremity which is in an immobilizer, able to move other extremities.  Sensation intact. Gait not checked.  PSYCHIATRIC: The patient is alert and oriented x 3.  SKIN: No obvious rash, lesion, or ulcer.   DATA REVIEW:   CBC Recent Labs  Lab 10/21/18 0608  WBC 10.4  HGB 11.2*  HCT 34.9*  PLT 240    Chemistries  Recent Labs  Lab 10/21/18 0608  NA 141  K 3.7  CL 110  CO2 22  GLUCOSE 96  BUN 18  CREATININE 0.89  CALCIUM 7.7*  AST 27  ALT 31  ALKPHOS 79  BILITOT 1.3*     Microbiology Results  Results for orders placed or performed during the hospital encounter of 08/25/17  Blood Culture (routine x 2)     Status: None   Collection Time: 08/26/17  3:55 AM  Result Value Ref Range Status   Specimen Description BLOOD RIGHT FOREARM  Final   Special Requests   Final    BOTTLES DRAWN AEROBIC AND ANAEROBIC Blood Culture adequate volume   Culture   Final    NO GROWTH 5 DAYS Performed at [facility], [address] [place], [place] [address]    Report Status 08/31/2017 FINAL  Final  Blood Culture (routine x 2)     Status: None   Collection Time: 08/26/17  4:02 AM  Result Value Ref Range Status   Specimen Description LEFT ANTECUBITAL  Final   Special Requests   Final    BOTTLES DRAWN AEROBIC AND ANAEROBIC Blood Culture adequate volume   Culture   Final    NO GROWTH 5 DAYS Performed at [facility], [address] [place], [place] [address]    Report Status 08/31/2017 FINAL  Final  MRSA PCR Screening     Status: Abnormal   Collection Time: 08/26/17  6:00 AM  Result Value Ref Range Status   MRSA by PCR POSITIVE (A) NEGATIVE Final    Comment:        The GeneXpert MRSA Assay (FDA approved for NASAL specimens only), is one component of a comprehensive MRSA colonization surveillance program. It is not intended to diagnose MRSA infection nor to guide or monitor treatment for MRSA infections. RESULT CALLED TO, READ BACK BY AND VERIFIED WITH: CHRISTINA BAYNES AT 0813 ON 08/26/17 [place]. Performed at [facility], [address] [place], [place] [address]     RADIOLOGY:  Ct Head Wo Contrast  Result Date: 10/20/2018 CLINICAL DATA:  Mechanical fall yesterday hitting head. EXAM: CT HEAD WITHOUT CONTRAST TECHNIQUE: Contiguous axial images were obtained from the base of the skull through the vertex without intravenous contrast. COMPARISON:  04/09/2017 FINDINGS: Brain: Right cerebellar encephalomalacia status post suboccipital craniectomy. Mild age related involutional changes of brain appear stable otherwise. Chronic microvascular ischemic disease of periventricular white matter with chronic right basal ganglial lacunar infarcts are noted. Midline fourth ventricle basal cisterns without  effacement. No intra-axial mass nor extra-axial collection. Vascular: Atherosclerosis of the carotid siphons. Skull: No acute skull fracture.  Suboccipital craniectomy on right.  Sinuses/Orbits: Intact orbits and globes. Mild ethmoid sinus mucosal thickening. Frothy mucous in the left sphenoid. Maxillary sinuses are clear to the extent included. Other: No significant soft tissue swelling. IMPRESSION: 1. No acute intracranial abnormality. 2. Chronic moderate small vessel ischemia and right basal ganglial lacunar infarct. 3. Status post right suboccipital craniectomy with right cerebellar encephalomalacia. Electronically Signed   By: Ashley Royalty M.D.   On: 10/20/2018 23:56   Dg Chest Portable 1 View  Result Date: 10/21/2018 CLINICAL DATA:  Patient fell.  Evaluate for fracture. EXAM: PORTABLE CHEST 1 VIEW COMPARISON:  08/26/2017 FINDINGS: AP supine view of the chest. 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 The patient is status post L1 kyphoplasty. Thoracic spondylosis with gentle dextroconvex curvature of the thoracic spine is again noted without change. Multilevel degenerative disc disease with endplate spurring is noted. Loop recording device projects over the left hemithorax. Stable borderline cardiomegaly. No acute pulmonary consolidation or overt pulmonary edema. Aortic atherosclerosis without aneurysm. No acute osseous appearing abnormality. IMPRESSION: Chronic elevation and/or eventration of the right hemidiaphragm. Stable borderline cardiomegaly with aortic atherosclerosis. Electronically Signed   By: Ashley Royalty M.D.   On: 10/21/2018 00:37   Dg Femur Min 2 Views Right  Result Date: 10/21/2018 CLINICAL DATA:  Mechanical fall yesterday. EXAM: RIGHT FEMUR 2 VIEWS COMPARISON:  None. FINDINGS: Right total knee arthroplasty. There is a oblique periprosthetic fracture of the distal femoral metaphysis. Medial displacement and overriding of the distal fracture fragments is present. Proximal femur and hip appear intact. Vascular calcifications. IMPRESSION: Oblique periprosthetic fracture of the distal right femoral  metaphysis. Right total knee arthroplasty. Electronically Signed   By: Lucienne Capers M.D.   On: 10/21/2018 00:39     Management plans discussed with the patient, family and they are in agreement.  CODE STATUS:     Code Status Orders  (From admission, onward)         Start     Ordered   10/21/18 0857  Do not attempt resuscitation (DNR)  Continuous    Question Answer Comment  In the event of cardiac or respiratory ARREST Do not call a "code blue"   In the event of cardiac or respiratory ARREST Do not perform Intubation, CPR, defibrillation or ACLS   In the event of cardiac or respiratory ARREST Use medication by any route, position, wound care, and other measures to relive pain and suffering. May use oxygen, suction and manual treatment of airway obstruction as needed for comfort.      10/21/18 0856        Code Status History    Date Active Date Inactive Code Status Order ID Comments User Context   10/21/2018 0458 10/21/2018 0856 Full Code [id number]  Christel Mormon, MD Inpatient   08/26/2017 0557 08/28/2017 1500 DNR [id number]  Saundra Shelling, MD Inpatient   08/25/2017 2303 08/26/2017 0557 DNR [id number]  Hinda Kehr, MD ED   06/09/2017 2202 06/15/2017 2046 DNR [id number]  Demetrios Loll, MD Inpatient   05/05/2017 2023 05/07/2017 2016 Full Code [id number]  Baxter Hire, MD Inpatient   04/16/2017 1015 04/19/2017 2009 DNR [id number]  Vaughan Basta, MD Inpatient   03/20/2017 0405 03/24/2017 2008 Full Code [id number]  Lance Coon, MD Inpatient   02/27/2017 0539 03/04/2017 1814 Full Code [id number]  Saundra Shelling, MD  Inpatient   02/22/2017 1547 02/26/2017 2305 Full Code [id number]  Vaughan Basta, MD Inpatient   03/01/2015 2112 03/10/2015 2017 Full Code [id number]  Demetrios Loll, MD Inpatient   12/24/2014 0527 12/25/2014 1832 DNR [id number]  Lance Coon, MD Inpatient    Advance Directive Documentation     Most Recent Value  Type of Advance Directive  Out of facility DNR (pink MOST or yellow  form)  Pre-existing out of facility DNR order (yellow form or pink MOST form)  Yellow form placed in chart (order not valid for inpatient use)  "MOST" Form in Place?  -      TOTAL TIME TAKING CARE OF THIS PATIENT: 45 minutes.    Gladstone Lighter M.D on 10/21/2018 at 10:45 AM  Between 7am to 6pm - Pager - [phone]  After 6pm go to [email or web address] - Proofreader  [facility]  Office  [phone]  CC: Primary care physician; Donato Schultz, MD   Note: This dictation was prepared with Dragon dictation along with smaller phrase technology. Any transcriptional errors that result from this process are unintentional.

## 2018-10-22 ENCOUNTER — Inpatient Hospital Stay (HOSPITAL_COMMUNITY): Payer: Medicare Other | Admitting: Anesthesiology

## 2018-10-22 ENCOUNTER — Encounter (HOSPITAL_COMMUNITY): Payer: Self-pay | Admitting: Certified Registered Nurse Anesthetist

## 2018-10-22 ENCOUNTER — Inpatient Hospital Stay (HOSPITAL_COMMUNITY): Payer: Medicare Other

## 2018-10-22 ENCOUNTER — Encounter (HOSPITAL_COMMUNITY)
Admission: AD | Disposition: A | Discharge status: Skilled Nursing Facility | Payer: Self-pay | Source: Other Acute Inpatient Hospital | Attending: Internal Medicine

## 2018-10-22 DIAGNOSIS — Z959 Presence of cardiac and vascular implant and graft, unspecified: Secondary | ICD-10-CM

## 2018-10-22 DIAGNOSIS — S72401D Unspecified fracture of lower end of right femur, subsequent encounter for closed fracture with routine healing: Secondary | ICD-10-CM

## 2018-10-22 DIAGNOSIS — Z8781 Personal history of (healed) traumatic fracture: Secondary | ICD-10-CM

## 2018-10-22 DIAGNOSIS — Z9889 Other specified postprocedural states: Secondary | ICD-10-CM

## 2018-10-22 HISTORY — PX: ORIF FEMUR FRACTURE: SHX2119

## 2018-10-22 LAB — CBC
HCT: 31.6 % — ABNORMAL LOW (ref 36.0–46.0)
Hemoglobin: 10.5 g/dL — ABNORMAL LOW (ref 12.0–15.0)
MCH: 29.6 pg (ref 26.0–34.0)
MCHC: 33.2 g/dL (ref 30.0–36.0)
MCV: 89 fL (ref 80.0–100.0)
Platelets: 189 10*3/uL (ref 150–400)
RBC: 3.55 MIL/uL — ABNORMAL LOW (ref 3.87–5.11)
RDW: 15.7 % — ABNORMAL HIGH (ref 11.5–15.5)
WBC: 6.6 10*3/uL (ref 4.0–10.5)
nRBC: 0 % (ref 0.0–0.2)

## 2018-10-22 LAB — BASIC METABOLIC PANEL
Anion gap: 6 (ref 5–15)
BUN: 15 mg/dL (ref 8–23)
CO2: 23 mmol/L (ref 22–32)
Calcium: 7.3 mg/dL — ABNORMAL LOW (ref 8.9–10.3)
Chloride: 109 mmol/L (ref 98–111)
Creatinine, Ser: 0.75 mg/dL (ref 0.44–1.00)
GFR calc Af Amer: >60 mL/min (ref >60)
GFR calc non Af Amer: >60 mL/min (ref >60)
Glucose, Bld: 89 mg/dL (ref 70–99)
Potassium: 3.7 mmol/L (ref 3.5–5.1)
Sodium: 138 mmol/L (ref 135–145)

## 2018-10-22 LAB — GLUCOSE, CAPILLARY
GLUCOSE-CAPILLARY: 77 mg/dL (ref 70–99)
GLUCOSE-CAPILLARY: 181 mg/dL — AB (ref 70–99)
Glucose-Capillary: 88 mg/dL (ref 70–99)
Glucose-Capillary: 154 mg/dL — ABNORMAL HIGH (ref 70–99)
Glucose-Capillary: 191 mg/dL — ABNORMAL HIGH (ref 70–99)

## 2018-10-22 LAB — URINE CULTURE: Special Requests: NORMAL

## 2018-10-22 LAB — CORTISOL: Cortisol, Plasma: 4.6 ug/dL

## 2018-10-22 SURGERY — OPEN REDUCTION INTERNAL FIXATION (ORIF) DISTAL FEMUR FRACTURE
Anesthesia: General | Site: Leg Upper | Laterality: Right

## 2018-10-22 SURGERY — OPEN REDUCTION INTERNAL FIXATION (ORIF) DISTAL FEMUR FRACTURE
Anesthesia: Choice | Laterality: Right

## 2018-10-22 MED ORDER — FENTANYL CITRATE (PF) 100 MCG/2ML IJ SOLN
INTRAMUSCULAR | Status: AC
  Filled 2018-10-22: qty 2

## 2018-10-22 MED ORDER — CEFAZOLIN SODIUM-DEXTROSE 2-4 GM/100ML-% IV SOLN
2.0000 g | Freq: Four times a day (QID) | INTRAVENOUS | Status: AC
  Administered 2018-10-22 – 2018-10-23 (×2): 2 g via INTRAVENOUS
  Filled 2018-10-22 (×2): qty 100

## 2018-10-22 MED ORDER — LACTATED RINGERS IV SOLN
INTRAVENOUS | Status: DC
  Administered 2018-10-22: 10:00:00 via INTRAVENOUS

## 2018-10-22 MED ORDER — HYDROCORTISONE NA SUCCINATE PF 100 MG IJ SOLR: 50.0000 mg | Freq: Once | INTRAMUSCULAR | Status: DC

## 2018-10-22 MED ORDER — ALBUMIN HUMAN 5 % IV SOLN
INTRAVENOUS | Status: AC
  Filled 2018-10-22: qty 250

## 2018-10-22 MED ORDER — PRAMIPEXOLE DIHYDROCHLORIDE 0.125 MG PO TABS
0.2500 mg | ORAL_TABLET | Freq: Every day | ORAL | Status: DC
  Administered 2018-10-22 – 2018-10-24 (×3): 0.25 mg via ORAL
  Filled 2018-10-22 (×3): qty 2

## 2018-10-22 MED ORDER — TOPIRAMATE 25 MG PO TABS
25.0000 mg | ORAL_TABLET | Freq: Every day | ORAL | Status: DC
  Administered 2018-10-22 – 2018-10-23 (×2): 25 mg via ORAL
  Filled 2018-10-22 (×2): qty 1

## 2018-10-22 MED ORDER — ESCITALOPRAM OXALATE 10 MG PO TABS
10.0000 mg | ORAL_TABLET | Freq: Every morning | ORAL | Status: DC
  Administered 2018-10-23 – 2018-10-24 (×2): 10 mg via ORAL
  Filled 2018-10-22 (×2): qty 1

## 2018-10-22 MED ORDER — CLONAZEPAM 0.125 MG PO TBDP
0.1250 mg | ORAL_TABLET | Freq: Two times a day (BID) | ORAL | Status: DC
  Administered 2018-10-22 – 2018-10-23 (×3): 0.125 mg via ORAL
  Filled 2018-10-22 (×3): qty 1

## 2018-10-22 MED ORDER — VANCOMYCIN HCL 500 MG IV SOLR
500.0000 mg | Freq: Once | INTRAVENOUS | Status: AC
  Administered 2018-10-22: 500 mg via INTRAVENOUS
  Filled 2018-10-22: qty 500

## 2018-10-22 MED ORDER — ONDANSETRON HCL 4 MG/2ML IJ SOLN
INTRAMUSCULAR | Status: AC
  Filled 2018-10-22: qty 2

## 2018-10-22 MED ORDER — BISACODYL 10 MG RE SUPP: 10.0000 mg | Freq: Every day | RECTAL | Status: DC | PRN

## 2018-10-22 MED ORDER — METOCLOPRAMIDE HCL 5 MG/ML IJ SOLN: 5.0000 mg | Freq: Three times a day (TID) | INTRAMUSCULAR | Status: DC | PRN

## 2018-10-22 MED ORDER — PROPOFOL 10 MG/ML IV BOLUS
INTRAVENOUS | Status: DC | PRN
  Administered 2018-10-22: 120 mg via INTRAVENOUS

## 2018-10-22 MED ORDER — FERROUS SULFATE 325 (65 FE) MG PO TABS
325.0000 mg | ORAL_TABLET | Freq: Three times a day (TID) | ORAL | Status: DC
  Administered 2018-10-23 – 2018-10-24 (×6): 325 mg via ORAL
  Filled 2018-10-22 (×7): qty 1

## 2018-10-22 MED ORDER — FENTANYL CITRATE (PF) 100 MCG/2ML IJ SOLN
INTRAMUSCULAR | Status: AC
  Administered 2018-10-22: 50 ug via INTRAVENOUS
  Filled 2018-10-22: qty 2

## 2018-10-22 MED ORDER — ENSURE ENLIVE PO LIQD
237.0000 mL | Freq: Two times a day (BID) | ORAL | Status: DC
  Administered 2018-10-23 – 2018-10-24 (×2): 237 mL via ORAL

## 2018-10-22 MED ORDER — HYDROCODONE-ACETAMINOPHEN 5-325 MG PO TABS
1.0000 | ORAL_TABLET | ORAL | Status: DC | PRN
  Administered 2018-10-22 – 2018-10-24 (×4): 2 via ORAL
  Filled 2018-10-22 (×3): qty 2

## 2018-10-22 MED ORDER — METOCLOPRAMIDE HCL 5 MG PO TABS: 5.0000 mg | ORAL_TABLET | Freq: Three times a day (TID) | ORAL | Status: DC | PRN

## 2018-10-22 MED ORDER — ENOXAPARIN SODIUM 40 MG/0.4ML ~~LOC~~ SOLN
40.0000 mg | SUBCUTANEOUS | Status: DC
  Administered 2018-10-23 – 2018-10-24 (×2): 40 mg via SUBCUTANEOUS
  Filled 2018-10-22 (×2): qty 0.4

## 2018-10-22 MED ORDER — SUGAMMADEX SODIUM 200 MG/2ML IV SOLN
INTRAVENOUS | Status: DC | PRN
  Administered 2018-10-22: 150 mg via INTRAVENOUS

## 2018-10-22 MED ORDER — POTASSIUM CHLORIDE IN NACL 20-0.9 MEQ/L-% IV SOLN
INTRAVENOUS | Status: DC
  Administered 2018-10-22: 18:00:00 via INTRAVENOUS
  Filled 2018-10-22 (×2): qty 1000

## 2018-10-22 MED ORDER — HYDROCORTISONE NA SUCCINATE PF 100 MG IJ SOLR
25.0000 mg | Freq: Three times a day (TID) | INTRAMUSCULAR | Status: DC
  Administered 2018-10-22 – 2018-10-23 (×3): 25 mg via INTRAVENOUS
  Filled 2018-10-22 (×3): qty 0.5

## 2018-10-22 MED ORDER — STERILE WATER FOR IRRIGATION IR SOLN
Status: DC | PRN
  Administered 2018-10-22: 1000 mL

## 2018-10-22 MED ORDER — ADULT MULTIVITAMIN W/MINERALS CH
1.0000 | ORAL_TABLET | Freq: Every day | ORAL | Status: DC
  Administered 2018-10-23 – 2018-10-24 (×2): 1 via ORAL
  Filled 2018-10-22 (×2): qty 1

## 2018-10-22 MED ORDER — MAGNESIUM CITRATE PO SOLN: 1.0000 | Freq: Once | ORAL | Status: DC | PRN

## 2018-10-22 MED ORDER — VITAMIN B-12 1000 MCG PO TABS
500.0000 ug | ORAL_TABLET | Freq: Every day | ORAL | Status: DC
  Administered 2018-10-23 – 2018-10-24 (×2): 500 ug via ORAL
  Filled 2018-10-22 (×2): qty 1

## 2018-10-22 MED ORDER — PROPOFOL 10 MG/ML IV BOLUS
INTRAVENOUS | Status: AC
  Filled 2018-10-22: qty 20

## 2018-10-22 MED ORDER — ACETAMINOPHEN 500 MG PO TABS
500.0000 mg | ORAL_TABLET | Freq: Four times a day (QID) | ORAL | Status: AC
  Administered 2018-10-22 – 2018-10-23 (×3): 500 mg via ORAL
  Filled 2018-10-22 (×3): qty 1

## 2018-10-22 MED ORDER — DEXAMETHASONE SODIUM PHOSPHATE 10 MG/ML IJ SOLN
INTRAMUSCULAR | Status: DC | PRN
  Administered 2018-10-22: 5 mg via INTRAVENOUS

## 2018-10-22 MED ORDER — HYDROCODONE-ACETAMINOPHEN 5-325 MG PO TABS
ORAL_TABLET | ORAL | Status: AC
  Filled 2018-10-22: qty 2

## 2018-10-22 MED ORDER — PHENYLEPHRINE 40 MCG/ML (10ML) SYRINGE FOR IV PUSH (FOR BLOOD PRESSURE SUPPORT)
PREFILLED_SYRINGE | INTRAVENOUS | Status: AC
  Filled 2018-10-22: qty 10

## 2018-10-22 MED ORDER — ONDANSETRON HCL 4 MG/2ML IJ SOLN
INTRAMUSCULAR | Status: DC | PRN
  Administered 2018-10-22: 4 mg via INTRAVENOUS

## 2018-10-22 MED ORDER — ONDANSETRON HCL 4 MG/2ML IJ SOLN
4.0000 mg | Freq: Four times a day (QID) | INTRAMUSCULAR | Status: DC | PRN
  Administered 2018-10-23 (×2): 4 mg via INTRAVENOUS
  Filled 2018-10-22 (×2): qty 2

## 2018-10-22 MED ORDER — DOCUSATE SODIUM 100 MG PO CAPS
100.0000 mg | ORAL_CAPSULE | Freq: Two times a day (BID) | ORAL | Status: DC
  Administered 2018-10-22 – 2018-10-24 (×3): 100 mg via ORAL
  Filled 2018-10-22 (×3): qty 1

## 2018-10-22 MED ORDER — 0.9 % SODIUM CHLORIDE (POUR BTL) OPTIME
TOPICAL | Status: DC | PRN
  Administered 2018-10-22: 1000 mL

## 2018-10-22 MED ORDER — HYDROCODONE-ACETAMINOPHEN 7.5-325 MG PO TABS
1.0000 | ORAL_TABLET | ORAL | Status: DC | PRN
  Administered 2018-10-22 – 2018-10-23 (×3): 2 via ORAL
  Filled 2018-10-22 (×4): qty 2

## 2018-10-22 MED ORDER — PHENOL 1.4 % MT LIQD: 1.0000 | OROMUCOSAL | Status: DC | PRN

## 2018-10-22 MED ORDER — POLYETHYLENE GLYCOL 3350 17 G PO PACK: 17.0000 g | PACK | Freq: Every day | ORAL | Status: DC | PRN

## 2018-10-22 MED ORDER — MORPHINE SULFATE (PF) 2 MG/ML IV SOLN
0.5000 mg | INTRAVENOUS | Status: DC | PRN
  Administered 2018-10-22 – 2018-10-23 (×4): 1 mg via INTRAVENOUS
  Filled 2018-10-22 (×5): qty 1

## 2018-10-22 MED ORDER — FENTANYL CITRATE (PF) 100 MCG/2ML IJ SOLN
25.0000 ug | INTRAMUSCULAR | Status: DC | PRN
  Administered 2018-10-22 (×3): 50 ug via INTRAVENOUS

## 2018-10-22 MED ORDER — FENTANYL CITRATE (PF) 250 MCG/5ML IJ SOLN
INTRAMUSCULAR | Status: AC
  Filled 2018-10-22: qty 5

## 2018-10-22 MED ORDER — BUSPIRONE HCL 5 MG PO TABS
7.5000 mg | ORAL_TABLET | Freq: Two times a day (BID) | ORAL | Status: DC
  Administered 2018-10-22 – 2018-10-24 (×5): 7.5 mg via ORAL
  Filled 2018-10-22 (×5): qty 2

## 2018-10-22 MED ORDER — FENTANYL CITRATE (PF) 100 MCG/2ML IJ SOLN
INTRAMUSCULAR | Status: DC | PRN
  Administered 2018-10-22 (×7): 50 ug via INTRAVENOUS

## 2018-10-22 MED ORDER — IPRATROPIUM-ALBUTEROL 0.5-2.5 (3) MG/3ML IN SOLN: 3.0000 mL | Freq: Four times a day (QID) | RESPIRATORY_TRACT | Status: DC | PRN

## 2018-10-22 MED ORDER — LIDOCAINE 2% (20 MG/ML) 5 ML SYRINGE
INTRAMUSCULAR | Status: DC | PRN
  Administered 2018-10-22: 60 mg via INTRAVENOUS

## 2018-10-22 MED ORDER — ALUM & MAG HYDROXIDE-SIMETH 200-200-20 MG/5ML PO SUSP: 30.0000 mL | ORAL | Status: DC | PRN

## 2018-10-22 MED ORDER — ALBUMIN HUMAN 5 % IV SOLN
INTRAVENOUS | Status: DC | PRN
  Administered 2018-10-22: 12:00:00 via INTRAVENOUS

## 2018-10-22 MED ORDER — ONDANSETRON HCL 4 MG PO TABS: 4.0000 mg | ORAL_TABLET | Freq: Four times a day (QID) | ORAL | Status: DC | PRN

## 2018-10-22 MED ORDER — INSULIN ASPART 100 UNIT/ML ~~LOC~~ SOLN: 0.0000 [IU] | Freq: Every day | SUBCUTANEOUS | Status: DC

## 2018-10-22 MED ORDER — PREDNISONE 5 MG PO TABS: 5.0000 mg | ORAL_TABLET | Freq: Every day | ORAL | Status: DC

## 2018-10-22 MED ORDER — ACETAMINOPHEN 325 MG PO TABS: 325.0000 mg | ORAL_TABLET | Freq: Four times a day (QID) | ORAL | Status: DC | PRN

## 2018-10-22 MED ORDER — HYDROMORPHONE HCL 1 MG/ML IJ SOLN: 0.2500 mg | INTRAMUSCULAR | Status: DC | PRN

## 2018-10-22 MED ORDER — ROCURONIUM BROMIDE 50 MG/5ML IV SOSY
PREFILLED_SYRINGE | INTRAVENOUS | Status: DC | PRN
  Administered 2018-10-22: 40 mg via INTRAVENOUS
  Administered 2018-10-22: 10 mg via INTRAVENOUS

## 2018-10-22 MED ORDER — MIRTAZAPINE 15 MG PO TABS
7.5000 mg | ORAL_TABLET | Freq: Every day | ORAL | Status: DC
  Administered 2018-10-22 – 2018-10-23 (×2): 7.5 mg via ORAL
  Filled 2018-10-22 (×2): qty 1

## 2018-10-22 MED ORDER — ALBUMIN HUMAN 5 % IV SOLN
12.5000 g | Freq: Once | INTRAVENOUS | Status: AC
  Administered 2018-10-22: 12.5 g via INTRAVENOUS

## 2018-10-22 MED ORDER — SODIUM CHLORIDE 0.9 % IV SOLN
INTRAVENOUS | Status: DC | PRN
  Administered 2018-10-22: 20 ug/min via INTRAVENOUS

## 2018-10-22 MED ORDER — INSULIN ASPART 100 UNIT/ML ~~LOC~~ SOLN
0.0000 [IU] | Freq: Three times a day (TID) | SUBCUTANEOUS | Status: DC
  Administered 2018-10-22 – 2018-10-23 (×2): 2 [IU] via SUBCUTANEOUS
  Administered 2018-10-23 – 2018-10-24 (×4): 1 [IU] via SUBCUTANEOUS

## 2018-10-22 MED ORDER — PHENYLEPHRINE HCL 10 MG/ML IJ SOLN
INTRAMUSCULAR | Status: DC | PRN
  Administered 2018-10-22: 200 ug via INTRAVENOUS
  Administered 2018-10-22: 80 ug via INTRAVENOUS

## 2018-10-22 MED ORDER — MENTHOL 3 MG MT LOZG: 1.0000 | LOZENGE | OROMUCOSAL | Status: DC | PRN

## 2018-10-22 SURGICAL SUPPLY — 60 items
BIT DRILL CALIBRATED 4.3MMX365 (DRILL) ×1 IMPLANT
BIT DRILL CROWE PNT TWST 4.5MM (DRILL) ×1 IMPLANT
BNDG COHESIVE 6X5 TAN STRL LF (GAUZE/BANDAGES/DRESSINGS) ×3 IMPLANT
BRUSH SCRUB SURG 4.25 DISP (MISCELLANEOUS) ×3 IMPLANT
CANISTER SUCT 3000ML PPV (MISCELLANEOUS) ×3 IMPLANT
CHLORAPREP W/TINT 26ML (MISCELLANEOUS) ×3 IMPLANT
CLOSURE WOUND 1/2 X4 (GAUZE/BANDAGES/DRESSINGS) ×1
COVER SURGICAL LIGHT HANDLE (MISCELLANEOUS) ×3 IMPLANT
DRAPE C-ARM 42X72 X-RAY (DRAPES) ×3 IMPLANT
DRAPE C-ARMOR (DRAPES) ×3 IMPLANT
DRAPE ORTHO SPLIT 77X108 STRL (DRAPES) ×4
DRAPE PROXIMA HALF (DRAPES) ×6 IMPLANT
DRAPE SURG 17X23 STRL (DRAPES) ×3 IMPLANT
DRAPE SURG ORHT 6 SPLT 77X108 (DRAPES) ×2 IMPLANT
DRAPE U-SHAPE 47X51 STRL (DRAPES) ×3 IMPLANT
DRILL CALIBRATED 4.3MMX365 (DRILL) ×3
DRILL CROWE POINT TWIST 4.5MM (DRILL) ×3
DRSG MEPILEX BORDER 4X4 (GAUZE/BANDAGES/DRESSINGS) ×3 IMPLANT
DRSG MEPILEX BORDER 4X8 (GAUZE/BANDAGES/DRESSINGS) ×3 IMPLANT
ELECT REM PT RETURN 9FT ADLT (ELECTROSURGICAL) ×3
ELECTRODE REM PT RTRN 9FT ADLT (ELECTROSURGICAL) ×1 IMPLANT
GLOVE BIO SURGEON STRL SZ 6.5 (GLOVE) ×2 IMPLANT
GLOVE BIO SURGEON STRL SZ7.5 (GLOVE) ×3 IMPLANT
GLOVE BIO SURGEONS STRL SZ 6.5 (GLOVE) ×1
GLOVE BIOGEL PI IND STRL 6.5 (GLOVE) ×1 IMPLANT
GLOVE BIOGEL PI IND STRL 7.5 (GLOVE) ×1 IMPLANT
GLOVE BIOGEL PI INDICATOR 6.5 (GLOVE) ×2
GLOVE BIOGEL PI INDICATOR 7.5 (GLOVE) ×2
GOWN STRL REUS W/ TWL LRG LVL3 (GOWN DISPOSABLE) ×1 IMPLANT
GOWN STRL REUS W/TWL LRG LVL3 (GOWN DISPOSABLE) ×2
GUIDEPIN 3.2X17.5 THRD DISP (PIN) ×3 IMPLANT
GUIDEWIRE BEAD TIP (WIRE) ×3 IMPLANT
IMMOBILIZER KNEE 20 (SOFTGOODS) ×3
IMMOBILIZER KNEE 20 THIGH 36 (SOFTGOODS) ×1 IMPLANT
KIT BASIN OR (CUSTOM PROCEDURE TRAY) ×3 IMPLANT
KIT TURNOVER KIT B (KITS) ×3 IMPLANT
NAIL FEM RETRO 10.5X300 (Nail) ×3 IMPLANT
NS IRRIG 1000ML POUR BTL (IV SOLUTION) ×3 IMPLANT
PACK TOTAL JOINT (CUSTOM PROCEDURE TRAY) ×3 IMPLANT
PAD ARMBOARD 7.5X6 YLW CONV (MISCELLANEOUS) ×3 IMPLANT
SCREW CORT TI DBL LEAD 5X30 (Screw) ×3 IMPLANT
SCREW CORT TI DBL LEAD 5X48 (Screw) ×3 IMPLANT
SCREW CORT TI DBL LEAD 5X50 (Screw) ×3 IMPLANT
SCREW CORT TI DBL LEAD 5X56 (Screw) ×3 IMPLANT
SCREW CORT TI DBL LEAD 5X75 (Screw) ×3 IMPLANT
STAPLER VISISTAT 35W (STAPLE) ×3 IMPLANT
STRIP CLOSURE SKIN 1/2X4 (GAUZE/BANDAGES/DRESSINGS) ×2 IMPLANT
SUCTION FRAZIER HANDLE 10FR (MISCELLANEOUS) ×2
SUCTION TUBE FRAZIER 10FR DISP (MISCELLANEOUS) ×1 IMPLANT
SUT ETHILON 3 0 PS 1 (SUTURE) ×6 IMPLANT
SUT VIC AB 0 CT1 27 (SUTURE)
SUT VIC AB 0 CT1 27XBRD ANBCTR (SUTURE) IMPLANT
SUT VIC AB 1 CT1 27 (SUTURE)
SUT VIC AB 1 CT1 27XBRD ANBCTR (SUTURE) IMPLANT
SUT VIC AB 2-0 CT1 27 (SUTURE) ×2
SUT VIC AB 2-0 CT1 TAPERPNT 27 (SUTURE) ×1 IMPLANT
SUT VIC AB 3-0 SH 8-18 (SUTURE) ×3 IMPLANT
TOWEL NATURAL 10PK STERILE (DISPOSABLE) ×3 IMPLANT
TRAY FOLEY BAG SILVER LF 16FR (CATHETERS) ×3 IMPLANT
WATER STERILE IRR 1000ML POUR (IV SOLUTION) ×3 IMPLANT

## 2018-10-22 NOTE — Progress Notes (Signed)
Called and spoke with patient's granddaughter, Danae Chen, and informed her of patient's surgery time. Granddaughter will get in touch with patient's daughter and notify her of surgery time and new room assignment. [place], Ardeth Sportsman

## 2018-10-22 NOTE — Progress Notes (Signed)
Orthopedic Tech Progress Note Patient Details:  Meghan [name] 1946/07/27 [id number]  Ortho Devices Type of Ortho Device: Arm sling Ortho Device/Splint Location: lue Ortho Device/Splint Interventions: Adjustment, Application, Ordered   Post Interventions Patient Tolerated: Well Instructions Provided: Care of device, Adjustment of device   Janit Pagan 10/22/2018, 3:50 PM

## 2018-10-22 NOTE — Progress Notes (Signed)
The risks benefits and alternatives were discussed with the patient including but not limited to the risks of nonoperative treatment, versus surgical intervention including infection, bleeding, nerve injury, malunion, nonunion, the need for revision surgery, hardware prominence, hardware failure, the need for hardware removal, blood clots, cardiopulmonary complications, morbidity, mortality, among others, and they were willing to proceed.    She has been reexamined, chart reviewed, had a pacemaker placed yesterday.  Positive MRSA colonization.  She comes from a nursing home.  Plan for retrograde femoral nail, she received vancomycin this morning already, nasal Betadine swab, Ancef, perioperative vancomycin for antimicrobial prophylaxis.  She is at exceedingly high risk given all of her multiple medical comorbidities.  Johnny Bridge, MD

## 2018-10-22 NOTE — Progress Notes (Addendum)
Pharmacy Antibiotic Note  Meghan [name] is a 73 y.o. female admitted on 10/21/2018 with syncope and right distal femur fracture. He is s/p OR and pharmacy consulted for post-op prophylaxis with vancomycin.  -SCr= 0.75, CrCl ~ 50 -last dose of vancomycin was 1000mg  at 6am today  Plan: -Vancomycin 500mg  x1 IV at 6pm Will sign off. Please contact pharmacy with any other needs.  Thank you  Hildred Laser, PharmD Clinical Pharmacist **Pharmacist phone directory can now be found on [email or web address] ([email or web address]).  Listed under [place].

## 2018-10-22 NOTE — Anesthesia Preprocedure Evaluation (Signed)
Anesthesia Evaluation  Patient identified by MRN, date of birth, ID band Patient awake    Reviewed: Allergy & Precautions, NPO status , Patient's Chart, lab work & pertinent test results  Airway Mallampati: II  TM Distance: >3 FB Neck ROM: Full    Dental  (+) Dental Advisory Given   Pulmonary asthma , COPD,  COPD inhaler,    breath sounds clear to auscultation       Cardiovascular hypertension, + CAD and +CHF  + dysrhythmias (complete heart block s/p pacemaker)  Rhythm:Regular Rate:Normal  Normal EF. Mild/mod MR, Mild AS   Neuro/Psych  Headaches, Seizures -,  Anxiety Depression Bipolar Disorder  Neuromuscular disease    GI/Hepatic Neg liver ROS, GERD  ,  Endo/Other  diabetes, Type 2  Renal/GU negative Renal ROS     Musculoskeletal  (+) Arthritis ,   Abdominal   Peds  Hematology  (+) anemia ,   Anesthesia Other Findings   Reproductive/Obstetrics                             Lab Results  Component Value Date   WBC 6.6 10/22/2018   HGB 10.5 (L) 10/22/2018   HCT 31.6 (L) 10/22/2018   MCV 89.0 10/22/2018   PLT 189 10/22/2018   Lab Results  Component Value Date   CREATININE 0.75 10/22/2018   BUN 15 10/22/2018   NA 138 10/22/2018   K 3.7 10/22/2018   CL 109 10/22/2018   CO2 23 10/22/2018    Anesthesia Physical Anesthesia Plan  ASA: III  Anesthesia Plan: General   Post-op Pain Management:    Induction: Intravenous  PONV Risk Score and Plan: 3 and Treatment may vary due to age or medical condition, Dexamethasone and Ondansetron  Airway Management Planned: Oral ETT  Additional Equipment:   Intra-op Plan:   Post-operative Plan: Extubation in OR  Informed Consent: I have reviewed the patients History and Physical, chart, labs and discussed the procedure including the risks, benefits and alternatives for the proposed anesthesia with the patient or authorized representative who  has indicated his/her understanding and acceptance.     Dental advisory given  Plan Discussed with: CRNA  Anesthesia Plan Comments:         Anesthesia Quick Evaluation

## 2018-10-22 NOTE — Anesthesia Postprocedure Evaluation (Signed)
Anesthesia Post Note  Patient: Meghan [name]  Procedure(s) Performed: OPEN REDUCTION INTERNAL FIXATION (ORIF) DISTAL FEMUR FRACTURE (Right Leg Upper)     Patient location during evaluation: PACU Anesthesia Type: General Level of consciousness: awake and alert Pain management: pain level controlled Vital Signs Assessment: post-procedure vital signs reviewed and stable Respiratory status: spontaneous breathing, nonlabored ventilation, respiratory function stable and patient connected to nasal cannula oxygen Cardiovascular status: blood pressure returned to baseline and stable Postop Assessment: no apparent nausea or vomiting Anesthetic complications: no    Last Vitals:  Vitals:   10/22/18 1545 10/22/18 2015  BP: 127/76 126/61  Pulse: (!) 115 66  Resp: 16 17  Temp:  37.2 C  SpO2: 92% 92%    Last Pain:  Vitals:   10/22/18 2015  TempSrc: Oral  PainSc:                  Tiajuana Amass

## 2018-10-22 NOTE — Transfer of Care (Signed)
Immediate Anesthesia Transfer of Care Note  Patient: Meghan [name]  Procedure(s) Performed: OPEN REDUCTION INTERNAL FIXATION (ORIF) DISTAL FEMUR FRACTURE (Right Leg Upper)  Patient Location: PACU  Anesthesia Type:General  Level of Consciousness: awake and alert   Airway & Oxygen Therapy: Patient Spontanous Breathing and Patient connected to nasal cannula oxygen  Post-op Assessment: Report given to RN and Post -op Vital signs reviewed and stable  Post vital signs: Reviewed and stable  Last Vitals:  Vitals Value Taken Time  BP 103/43 10/22/2018  1:03 PM  Temp    Pulse 117 10/22/2018  1:11 PM  Resp 22 10/22/2018  1:11 PM  SpO2 94 % 10/22/2018  1:11 PM  Vitals shown include unvalidated device data.  Last Pain:  Vitals:   10/22/18 0900  TempSrc:   PainSc: 8          Complications: No apparent anesthesia complications

## 2018-10-22 NOTE — Progress Notes (Addendum)
[place] TEAM 1 - Stepdown/ICU TEAM  Meghan [name]  NTI:[id number] DOB: June 25, 1946 DOA: 10/21/2018 PCP: Donato Schultz, MD    Brief Narrative:  73yo w/ a hx of rheumatoid arthritis, bipolar disorder, DM2, peptic ulcer disease, and COPD who presented to the [facility] on 10/20/2018 after a fall the day before, related to syncope. She was found to be in complete heart block, diagnosed with a periprosthetic distal right femur fracture, and was admitted.   Cardiology consultation was obtained, and permanent pacemaker was recommended, necessitating transfer to [facility].    Significant Events: 3/13 admit to [place] > transfer to [facility]  3/13 pacemaker implant - loop recorder removal   Subjective: The patient is seen in the PACU.  She is awake alert and conversant.  She complains of severe pain in her right lower extremity.  She denies chest pain shortness of breath or headache.  Assessment & Plan:  Complete heart block  EP is addressing this issue - she is now s/p pacer and appears stable    Distal right femur fracture  Tolerated surgery well - post-op care per Orthopedics    Adrenal Insufficiency AM cortisol <5 during clinical situation in which it should be elevated, c/w an impaired HPA axis - cont stress dose steroids until clinically stabilized, then wean back to baseline dosing    COPD  Clinically stable at present    DM 2 Check A1c in AM - follow CBG w/ SSI   RA  Managed with tofacitinib and daily prednisone 5 mg - see discussion above - no acute flair at present   Bipolar disorder  Resume usual home meds   +UA No sx to suggest UTI presently - follow w/o abx for now  MRSA screen +  DVT prophylaxis: lovenox when cleared by Ortho Code Status: FULL CODE Family Communication: no family present at time of exam  Disposition Plan: stable for tele bed   Consultants:  EP Ortho  Antimicrobials:  none  Objective: Blood pressure (!) 118/49, pulse 80, temperature  98.8 F (37.1 C), temperature source Oral, resp. rate 18, SpO2 98 %.  Intake/Output Summary (Last 24 hours) at 10/22/2018 0808 Last data filed at 10/22/2018 0600 Gross per 24 hour  Intake 859.95 ml  Output 150 ml  Net 709.95 ml   There were no vitals filed for this visit.  Examination: General: No acute respiratory distress Lungs: Clear to auscultation bilaterally without wheezes or crackles Cardiovascular: Regular rate and rhythm without murmur gallop or rub normal S1 and S2 Abdomen: Nontender, nondistended, soft, bowel sounds positive, no rebound, no ascites, no appreciable mass Extremities: R LE in knee immobilizer - no signif LE edema   CBC: Recent Labs  Lab 10/21/18 0100 10/21/18 0608 10/22/18 0306  WBC 10.0 10.4 6.6  NEUTROABS 7.5  --   --   HGB 11.2* 11.2* 10.5*  HCT 34.2* 34.9* 31.6*  MCV 88.6 91.1 89.0  PLT 262 240 867   Basic Metabolic Panel: Recent Labs  Lab 10/21/18 0100 10/21/18 0608 10/22/18 0306  NA 140 141 138  K 3.7 3.7 3.7  CL 108 110 109  CO2 22 22 23   GLUCOSE 91 96 89  BUN 15 18 15   CREATININE 0.81 0.89 0.75  CALCIUM 7.6* 7.7* 7.3*   GFR: Estimated Creatinine Clearance: 48.4 mL/min (by C-G formula based on SCr of 0.75 mg/dL).  Liver Function Tests: Recent Labs  Lab 10/21/18 0100 10/21/18 0608  AST 32 27  ALT 34 31  ALKPHOS  62 62  BILITOT 1.3* 1.3*  PROT 5.9* 6.0*  ALBUMIN 3.2* 3.0*     Coagulation Profile: Recent Labs  Lab 10/21/18 0411  INR 1.2    Cardiac Enzymes: Recent Labs  Lab 10/21/18 0100 10/21/18 0631  TROPONINI <0.03 <0.03    HbA1C: Hemoglobin A1C  Date/Time Value Ref Range Status  08/01/2012 04:55 AM 6.9 (H) 4.2 - 6.3 % Final    Comment:    The American Diabetes Association recommends that a primary goal of therapy should be <7% and that physicians should reevaluate the treatment regimen in patients with HbA1c values consistently >8%.    Hgb A1c MFr Bld  Date/Time Value Ref Range Status  02/24/2017  04:58 AM 5.6 4.8 - 5.6 % Final    Comment:    (NOTE)         Pre-diabetes: 5.7 - 6.4         Diabetes: >6.4         Glycemic control for adults with diabetes: <7.0   03/01/2015 10:05 PM 10.3 (H) 4.0 - 6.0 % Final    CBG: Recent Labs  Lab 10/21/18 0756 10/21/18 1142 10/21/18 2100 10/21/18 2329 10/22/18 0426  GLUCAP 76 105* 141* 116* 77    Recent Results (from the past 240 hour(s))  MRSA PCR Screening     Status: Abnormal   Collection Time: 10/21/18  9:53 AM  Result Value Ref Range Status   MRSA by PCR POSITIVE (A) NEGATIVE Final    Comment:        The GeneXpert MRSA Assay (FDA approved for NASAL specimens only), is one component of a comprehensive MRSA colonization surveillance program. It is not intended to diagnose MRSA infection nor to guide or monitor treatment for MRSA infections. RESULT CALLED TO, READ BACK BY AND VERIFIED WITH: STACEY POLLY @1123  ON 10/21/2018 BY FMW Performed at [facility], [address] [place], [place] [address]      Scheduled Meds: . hydrocortisone sod succinate (SOLU-CORTEF) inj  25 mg Intravenous Q8H  . hydrocortisone sod succinate (SOLU-CORTEF) inj  50 mg Intravenous Once  . insulin aspart  0-9 Units Subcutaneous Q4H  . ipratropium-albuterol  3 mL Nebulization TID  . pantoprazole  40 mg Oral Daily  . povidone-iodine  2 application Topical Once  . predniSONE  5 mg Oral Q breakfast     LOS: 1 day   Cherene Altes, MD [facility] Office  [phone] Pager - Text Page per Amion  If 7PM-7AM, please contact night-coverage per Amion 10/22/2018, 8:08 AM

## 2018-10-22 NOTE — Social Work (Signed)
CSW acknowledging consult for SNF placement. Will follow for therapy recommendations.   Sirr Kabel, MSW, LCSWA [place] Clinical Social Work [phone]   

## 2018-10-22 NOTE — Progress Notes (Signed)
Initial Nutrition Assessment  DOCUMENTATION CODES:   Obesity unspecified  INTERVENTION:    Ensure Enlive po BID, each supplement provides 350 kcal and 20 grams of protein  Multivitamin daily  NUTRITION DIAGNOSIS:   Increased nutrient needs related to hip fracture as evidenced by estimated needs.  GOAL:   Patient will meet greater than or equal to 90% of their needs  MONITOR:   PO intake, Supplement acceptance  REASON FOR ASSESSMENT:   Consult Hip fracture protocol  ASSESSMENT:   73 yo female with PMH of COPD, DM, HTN, asthma, CAD, osteoporosis, HLD, CHF, collagen vascular disease who was admitted with R femur fracture, found to have complete heart block. S/P pacemaker on 3/13. S/P femoral nailing of R femur fracture 3/14.   Patient remains in the OR; unable to speak with her or complete nutrition focused physical exam.   Patient is at increased nutrition risk, given multiple acute and chronic illnesses.  Per review of weight encounters, weights seem to be gradually increasing over the past 1.5 years.   Labs and medications reviewed.   NUTRITION - FOCUSED PHYSICAL EXAM:  unable to complete  Diet Order:   Diet Order            Diet regular Room service appropriate? Yes; Fluid consistency: Thin  Diet effective now              EDUCATION NEEDS:   No education needs have been identified at this time  Skin:  Skin Assessment: Reviewed RN Assessment  Last BM:  3/14  Height:   Ht Readings from Last 1 Encounters:  10/21/18 4\' 7"  (1.397 m)    Weight:   Wt Readings from Last 1 Encounters:  10/21/18 69.5 kg    Ideal Body Weight:  41.7 kg   BMI= 35.6  Estimated Nutritional Needs:   Kcal:  1400-1600  Protein:  70-80 gm  Fluid:  1.5 L    Molli Barrows, RD, LDN, [place] Pager [phone] After Hours Pager [phone]

## 2018-10-22 NOTE — Anesthesia Procedure Notes (Signed)
Procedure Name: Intubation Date/Time: 10/22/2018 10:41 AM Performed by: Inda Coke, CRNA Pre-anesthesia Checklist: Patient identified, Emergency Drugs available, Suction available and Patient being monitored Patient Re-evaluated:Patient Re-evaluated prior to induction Oxygen Delivery Method: Circle System Utilized Preoxygenation: Pre-oxygenation with 100% oxygen Induction Type: IV induction Ventilation: Mask ventilation without difficulty and Oral airway inserted - appropriate to patient size Laryngoscope Size: Mac and 3 Grade View: Grade I Tube type: Oral Tube size: 7.0 mm Number of attempts: 1 Airway Equipment and Method: Stylet and Oral airway Placement Confirmation: ETT inserted through vocal cords under direct vision,  positive ETCO2 and breath sounds checked- equal and bilateral Secured at: 22 cm Tube secured with: Tape Dental Injury: Teeth and Oropharynx as per pre-operative assessment

## 2018-10-22 NOTE — Progress Notes (Signed)
Progress Note  [name] Date of Encounter: 10/22/2018  Primary Cardiologist: [facility]  Primary Electrophysiologist: SK   Patient Profile     73 y.o. female WITH Recurrent syncope and documented haert block transferred 3/13 for pacemaker prior to surgical repair of fractured femur   Subjective   No chest pain but a lot of leg pain   Inpatient Medications    Scheduled Meds: . hydrocortisone sod succinate (SOLU-CORTEF) inj  25 mg Intravenous Q8H  . hydrocortisone sod succinate (SOLU-CORTEF) inj  50 mg Intravenous Once  . insulin aspart  0-9 Units Subcutaneous Q4H  . ipratropium-albuterol  3 mL Nebulization TID  . pantoprazole  40 mg Oral Daily  . povidone-iodine  2 application Topical Once  . predniSONE  5 mg Oral Q breakfast   Continuous Infusions: .  ceFAZolin (ANCEF) IV    . methocarbamol (ROBAXIN) IV    . sodium chloride 0.45 % with kcl 65 mL/hr at 10/22/18 0100   PRN Meds: acetaminophen, fentaNYL (SUBLIMAZE) injection, HYDROcodone-acetaminophen, methocarbamol **OR** methocarbamol (ROBAXIN) IV, morphine injection, ondansetron (ZOFRAN) IV, polyethylene glycol   Vital Signs    Vitals:   10/22/18 0400 10/22/18 0500 10/22/18 0600 10/22/18 0700  BP: (!) 125/56  (!) 113/49 (!) 118/49  Pulse: 96  82 80  Resp: 18 (!) 23 15 18   Temp: 98.8 F (37.1 C)     TempSrc: Oral     SpO2: 95%  98% 98%    Intake/Output Summary (Last 24 hours) at 10/22/2018 2440 Last data filed at 10/22/2018 0600 Gross per 24 hour  Intake 859.95 ml  Output 150 ml  Net 709.95 ml   There were no vitals filed for this visit.  Telemetry    P-synchronous/ AV  pacing  - Personally Reviewed  ECG    P-synchronous/ AV  pacing  - Personally Reviewed  Physical Exam    GEN: In pain but no dyspnea    Neck: JVD flat Cardiac: RRR, no  murmurs, rubs, or gallops.  Pocket without hematoma, swelling or tenderness  Respiratory: Clear to auscultation bilaterally. GI: Soft, nontender,  non-distended  MS:  edema; No deformity. Neuro:  Nonfocal  Psych: Normal affect  Skin Warm and dry   Labs    Chemistry Recent Labs  Lab 10/21/18 0100 10/21/18 0608 10/22/18 0306  NA 140 141 138  K 3.7 3.7 3.7  CL 108 110 109  CO2 22 22 23   GLUCOSE 91 96 89  BUN 15 18 15   CREATININE 0.81 0.89 0.75  CALCIUM 7.6* 7.7* 7.3*  PROT 5.9* 6.0*  --   ALBUMIN 3.2* 3.0*  --   AST 32 27  --   ALT 34 31  --   ALKPHOS 62 62  --   BILITOT 1.3* 1.3*  --   GFRNONAA >60 >60 >60  GFRAA >60 >60 >60  ANIONGAP 10 9 6      Hematology Recent Labs  Lab 10/21/18 0100 10/21/18 0608 10/22/18 0306  WBC 10.0 10.4 6.6  RBC 3.86* 3.83* 3.55*  HGB 11.2* 11.2* 10.5*  HCT 34.2* 34.9* 31.6*  MCV 88.6 91.1 89.0  MCH 29.0 29.2 29.6  MCHC 32.7 32.1 33.2  RDW 15.3 15.6* 15.7*  PLT 262 240 189    Cardiac Enzymes Recent Labs  Lab 10/21/18 0100 10/21/18 0631  TROPONINI <0.03 <0.03   No results for input(s): TROPIPOC in the last 168 hours.   BNPNo results for input(s): BNP, PROBNP in the last 168 hours.  DDimer No results for input(s): DDIMER in the last 168 hours.   Radiology    Dg Chest 2 View  Result Date: 10/22/2018 CLINICAL DATA:  Initial evaluation status post pacer placement, history of femur fracture. EXAM: CHEST - 2 VIEW COMPARISON:  Prior radiograph from 10/21/2018 FINDINGS: Cardiac and mediastinal silhouettes are stable in size and contour, and remain within normal limits. Dual lead left-sided transvenous pacemaker/AICD stable in position and alignment with electrodes overlying the right atrium and right ventricle. Chronic elevation of the right hemidiaphragm, stable. Associated mild bibasilar atelectasis. No other new focal airspace disease. No overt pulmonary edema or significant pleural effusion. No pneumothorax. Osseous structures are unchanged. Sequelae of prior vertebral augmentation noted at L1. Age indeterminate proximal right humeral fracture again noted. IMPRESSION: 1.  Stable appearance of the chest with chronic elevation of the right hemidiaphragm and mild bibasilar atelectasis. 2. No other new active cardiopulmonary disease. Electronically Signed   By: Jeannine Boga M.D.   On: 10/22/2018 05:28   Ct Head Wo Contrast  Result Date: 10/20/2018 CLINICAL DATA:  Mechanical fall yesterday hitting head. EXAM: CT HEAD WITHOUT CONTRAST TECHNIQUE: Contiguous axial images were obtained from the base of the skull through the vertex without intravenous contrast. COMPARISON:  04/09/2017 FINDINGS: Brain: Right cerebellar encephalomalacia status post suboccipital craniectomy. Mild age related involutional changes of brain appear stable otherwise. Chronic microvascular ischemic disease of periventricular white matter with chronic right basal ganglial lacunar infarcts are noted. Midline fourth ventricle basal cisterns without effacement. No intra-axial mass nor extra-axial collection. Vascular: Atherosclerosis of the carotid siphons. Skull: No acute skull fracture.  Suboccipital craniectomy on right. Sinuses/Orbits: Intact orbits and globes. Mild ethmoid sinus mucosal thickening. Frothy mucous in the left sphenoid. Maxillary sinuses are clear to the extent included. Other: No significant soft tissue swelling. IMPRESSION: 1. No acute intracranial abnormality. 2. Chronic moderate small vessel ischemia and right basal ganglial lacunar infarct. 3. Status post right suboccipital craniectomy with right cerebellar encephalomalacia. Electronically Signed   By: Ashley Royalty M.D.   On: 10/20/2018 23:56   Chest Portable 1 View  Result Date: 10/21/2018 CLINICAL DATA:  Preoperative evaluation for femur fracture EXAM: PORTABLE CHEST 1 VIEW COMPARISON:  October 20, 2018 FINDINGS: There is persistent elevation the right hemidiaphragm with atelectatic change in the right base. There is no edema or consolidation. Heart size and pulmonary vascularity within normal limits. There is no a pacemaker present  with lead tips attached to the right atrium and right ventricle. Loop recorder no longer appreciable. No adenopathy. There is age uncertain trauma involving the proximal right humerus. IMPRESSION: Persistent elevation right hemidiaphragm with atelectasis right base. No frank airspace consolidation. Heart size within normal limits. Pacemaker now present with lead tips attached to right atrium and right ventricle. Age uncertain trauma proximal right humerus. Electronically Signed   By: Lowella Grip III M.D.   On: 10/21/2018 20:47   Dg Chest Portable 1 View  Result Date: 10/21/2018 CLINICAL DATA:  Patient fell.  Evaluate for fracture. EXAM: PORTABLE CHEST 1 VIEW COMPARISON:  08/26/2017 FINDINGS: AP supine view of the chest. 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 The patient is status post L1 kyphoplasty. Thoracic spondylosis with gentle dextroconvex curvature of the thoracic spine is again noted without change. Multilevel degenerative disc disease with endplate spurring is noted. Loop recording device projects over the left hemithorax. Stable borderline cardiomegaly. No acute pulmonary consolidation or overt pulmonary edema. Aortic atherosclerosis without  aneurysm. No acute osseous appearing abnormality. IMPRESSION: Chronic elevation and/or eventration of the right hemidiaphragm. Stable borderline cardiomegaly with aortic atherosclerosis. Electronically Signed   By: Ashley Royalty M.D.   On: 10/21/2018 00:37   Dg Femur Min 2 Views Right  Result Date: 10/21/2018 CLINICAL DATA:  Mechanical fall yesterday. EXAM: RIGHT FEMUR 2 VIEWS COMPARISON:  None. FINDINGS: Right total knee arthroplasty. There is a oblique periprosthetic fracture of the distal femoral metaphysis. Medial displacement and overriding of the distal fracture fragments is present. Proximal femur and hip appear intact. Vascular calcifications. IMPRESSION: Oblique periprosthetic  fracture of the distal right femoral metaphysis. Right total knee arthroplasty. Electronically Signed   By: Lucienne Capers M.D.   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

## 2018-10-22 NOTE — Op Note (Signed)
10/21/2018 - 10/22/2018  12:22 PM  PATIENT:  Meghan [name]    PRE-OPERATIVE DIAGNOSIS: Right femur periprosthetic supracondylar fracture  POST-OPERATIVE DIAGNOSIS:  Same  PROCEDURE: Retrograde femoral nailing, right distal femur supracondylar periprosthetic fracture  SURGEON:  Johnny Bridge, MD  PHYSICIAN ASSISTANT: Joya Gaskins, OPA-C, present and scrubbed throughout the case, critical for completion in a timely fashion, and for retraction, instrumentation, and closure.  ANESTHESIA:   General  PREOPERATIVE INDICATIONS:  Meghan [name] is a  73 y.o. female who had a right supracondylar periprosthetic femur fracture and was seen initially at [facility], found to be in heart block, transferred to [facility] for pacemaker, medically optimized, and then went to surgery for retrograde femoral nailing.  The risks benefits and alternatives were discussed with the patient preoperatively including but not limited to the risks of infection, bleeding, nerve injury, cardiopulmonary complications, recurrent fracture, failure of the prosthesis, malunion, nonunion, DVT, the need for revision surgery, among others, and the patient was willing to proceed.  ESTIMATED BLOOD LOSS: 150 mL  OPERATIVE IMPLANTS: [employer] [place] retrograde femoral nail with 4 distal locking bolts, and one proximal locking bolt  OPERATIVE FINDINGS: Significant osteopenia with displaced supracondylar femur fracture.  The arthroplasty components appeared stable.  OPERATIVE PROCEDURE: The patient was brought to the operating room and placed in the supine position.  She had been on vancomycin because of positive MRSA colonization.  She also received Ancef.  The right lower extremity was prepped and draped in usual sterile fashion.  Timeout performed.  She was under general anesthesia and a Foley had been administered.  Incision was made through her previous total knee incision, parapatellar incision carried out through the previous line  of Ethibond sutures.  The patella was subluxated, the patellar button protected, and the polyethylene also protected throughout the case.  The knee was flexed, the opening within the distal femoral component evaluated, a guidewire was introduced using live C-arm fluoroscopy on AP and lateral views.  This was in the appropriate position.  I used a reamer to gently open the distal femur taking care to protect the polyethylene as well as the femoral component.  This was in line on the AP and lateral views.  I then reduced the fracture anatomically as best as possible using a percutaneous tenaculum clamp, as well as manual traction and rotation and positioning of the posterior triangle.  I introduced a femoral nail after measuring the length, and this went down the femoral canal quite easily.  I did not need to ream.  I seated the nail just below the prosthesis, with the tip of the nail at the level of the lesser trochanter.  I secured the nail distally with interlocking bolts, and then locked the bolts down with a central sleeve.  I then secured the nail proximally with a interlocking bolt in the static hole.  Excellent reduction and stable fixation achieved, the wounds were irrigated copiously, the parapatellar tissue repaired with Vicryl, followed by Vicryl for the subcutaneous tissue, and then routine closure for the skin.  Sterile gauze was applied followed by a knee immobilizer.  She tolerated the procedure well and there were no complications.    She will be touch toe weightbearing on the right lower extremity, likely doing primarily transfers.  DVT prophylaxis for approximately 1 month, we will coordinate with the medical service based on her cardiac condition what the best DVT prophylaxis will be, but we will plan to begin with Lovenox.

## 2018-10-23 ENCOUNTER — Encounter (HOSPITAL_COMMUNITY): Payer: Self-pay | Admitting: *Deleted

## 2018-10-23 DIAGNOSIS — I10 Essential (primary) hypertension: Secondary | ICD-10-CM

## 2018-10-23 LAB — COMPREHENSIVE METABOLIC PANEL
ALK PHOS: 55 U/L (ref 38–126)
ALT: 22 U/L (ref 0–44)
AST: 19 U/L (ref 15–41)
Albumin: 2.9 g/dL — ABNORMAL LOW (ref 3.5–5.0)
Anion gap: 8 (ref 5–15)
BUN: 7 mg/dL — ABNORMAL LOW (ref 8–23)
CO2: 20 mmol/L — ABNORMAL LOW (ref 22–32)
Calcium: 6.7 mg/dL — ABNORMAL LOW (ref 8.9–10.3)
Chloride: 110 mmol/L (ref 98–111)
Creatinine, Ser: 0.56 mg/dL (ref 0.44–1.00)
GFR calc Af Amer: >60 mL/min (ref >60)
GFR calc non Af Amer: >60 mL/min (ref >60)
Glucose, Bld: 136 mg/dL — ABNORMAL HIGH (ref 70–99)
Potassium: 3.8 mmol/L (ref 3.5–5.1)
Sodium: 138 mmol/L (ref 135–145)
Total Bilirubin: 1 mg/dL (ref 0.3–1.2)
Total Protein: 5.4 g/dL — ABNORMAL LOW (ref 6.5–8.1)

## 2018-10-23 LAB — CBC
HCT: 27.8 % — ABNORMAL LOW (ref 36.0–46.0)
HEMOGLOBIN: 8.7 g/dL — AB (ref 12.0–15.0)
MCH: 28.4 pg (ref 26.0–34.0)
MCHC: 31.3 g/dL (ref 30.0–36.0)
MCV: 90.8 fL (ref 80.0–100.0)
Platelets: 160 10*3/uL (ref 150–400)
RBC: 3.06 MIL/uL — ABNORMAL LOW (ref 3.87–5.11)
RDW: 15.9 % — ABNORMAL HIGH (ref 11.5–15.5)
WBC: 6.2 10*3/uL (ref 4.0–10.5)
nRBC: 0 % (ref 0.0–0.2)

## 2018-10-23 LAB — MAGNESIUM: MAGNESIUM: 1.9 mg/dL (ref 1.7–2.4)

## 2018-10-23 LAB — GLUCOSE, CAPILLARY
Glucose-Capillary: 137 mg/dL — ABNORMAL HIGH (ref 70–99)
Glucose-Capillary: 185 mg/dL — ABNORMAL HIGH (ref 70–99)
Glucose-Capillary: 101 mg/dL — ABNORMAL HIGH (ref 70–99)
Glucose-Capillary: 90 mg/dL (ref 70–99)

## 2018-10-23 LAB — HEMOGLOBIN A1C
Hgb A1c MFr Bld: 5.8 % — ABNORMAL HIGH (ref 4.8–5.6)
Mean Plasma Glucose: 119.76 mg/dL

## 2018-10-23 LAB — PHOSPHORUS: Phosphorus: 1.2 mg/dL — ABNORMAL LOW (ref 2.5–4.6)

## 2018-10-23 MED ORDER — HYDROCORTISONE NA SUCCINATE PF 100 MG IJ SOLR
25.0000 mg | Freq: Two times a day (BID) | INTRAMUSCULAR | Status: DC
  Administered 2018-10-24 (×2): 25 mg via INTRAVENOUS
  Filled 2018-10-23 (×2): qty 0.5

## 2018-10-23 NOTE — Progress Notes (Signed)
[facility] PROGRESS NOTE    Progress Note  Meghan [name]  DZH:[id number] DOB: 06/10/1946 DOA: 10/21/2018 PCP: Donato Schultz, MD     Brief Narrative:   Meghan [name] is an 73 y.o. female past medical history of rheumatoid arthritis bipolar disorder diabetes mellitus type 2 peptic ulcer disease COPD presents with [facility] on 10/20/2018 after a fall due to syncopal episode. She was found to be in complete heart block and diagnosed with periprosthetic distal femur fracture cardiology was consulted  Significant events: 10/21/2018 transfer from [place] to [place].  10/21/2018 Pacemaker implanted, loop recorder removed  Assessment/Plan:   Third-degree AV block: EP managing, she is status post pacemaker placement on 10/21/2018. She appears to be stable.  Distal closed fracture of distal end of right femur ([place]), periprosthetic Status post ORIF F on 10/22/2018 by Dr. Mardelle Matte. Awaiting physical therapy evaluation.  Adrenal insufficiency: Early on stress dose steroids will continue to titrate down probably can change to home dose tomorrow.  COPD: Currently stable.  Diabetes mellitus type 2: He on IV steroids which is making his blood glucose high, currently titrating IV steroids will continue sliding scale insulin.  A1c is pending.   Rheumatoid arthritis: Continue current medication, continue higher dose of steroids due to adrenal insufficiency will titrate down.  Severe bipolar disorder with psychotic features, mood-congruent ([place]); Continue current medication.   RN Pressure Injury Documentation: Pressure Injury 03/21/17 Stage II -  Partial thickness loss of dermis presenting as a shallow open ulcer with a red, pink wound bed without slough. Small area, approx. the size of the tip of a pinky finger. Open skin area under where the pink foam pad is (Active)  03/21/17 2000  Location: Coccyx  Location Orientation: Mid;Lower  Staging: Stage II -  Partial thickness loss of  dermis presenting as a shallow open ulcer with a red, pink wound bed without slough.  Wound Description (Comments): Small area, approx. the size of the tip of a pinky finger. Open skin area under where the pink foam pad is located.   Present on Admission: No     Pressure Injury 08/26/17 Stage I -  Intact skin with non-blanchable redness of a localized area usually over a bony prominence. REDDENED WITH BLANCHABLE (Active)  08/26/17 0624  Location: Coccyx  Location Orientation: Left;Right  Staging: Stage I -  Intact skin with non-blanchable redness of a localized area usually over a bony prominence.  Wound Description (Comments): REDDENED WITH BLANCHABLE  Present on Admission: Yes    Estimated body mass index is 35.61 kg/m as calculated from the following:   Height as of an earlier encounter on 10/21/18: 4\' 7"  (1.397 m).   Weight as of an earlier encounter on 10/21/18: 69.5 kg. Malnutrition Type:  Nutrition Problem: Increased nutrient needs Etiology: hip fracture   Malnutrition Characteristics:  Signs/Symptoms: estimated needs   Nutrition Interventions:  Interventions: Ensure Enlive (each supplement provides 350kcal and 20 grams of protein)    DVT prophylaxis: lovenox Family Communication:daughter Disposition Plan/Barrier to D/C: SNF in 1-2 days Code Status:     Code Status Orders  (From admission, onward)         Start     Ordered   10/21/18 2024  Full code  Continuous     10/21/18 2025        Code Status History    Date Active Date Inactive Code Status Order ID Comments User Context   10/21/2018 1904 10/21/2018 2025 Full Code [id number]  Caryl Comes,  Revonda Standard, MD Inpatient   10/21/2018 0857 10/21/2018 1425 DNR [id number]  Christel Mormon, MD Inpatient   10/21/2018 0458 10/21/2018 0856 Full Code [id number]  Christel Mormon, MD Inpatient   08/26/2017 0557 08/28/2017 1500 DNR [id number]  Saundra Shelling, MD Inpatient   08/25/2017 2303 08/26/2017 0557 DNR [id number]  Hinda Kehr, MD ED    06/09/2017 2202 06/15/2017 2046 DNR [id number]  Demetrios Loll, MD Inpatient   05/05/2017 2023 05/07/2017 2016 Full Code [id number]  Baxter Hire, MD Inpatient   04/16/2017 1015 04/19/2017 2009 DNR [id number]  Vaughan Basta, MD Inpatient   03/20/2017 0405 03/24/2017 2008 Full Code [id number]  Lance Coon, MD Inpatient   02/27/2017 0539 03/04/2017 1814 Full Code [id number]  Saundra Shelling, MD Inpatient   02/22/2017 1547 02/26/2017 2305 Full Code [id number]  Vaughan Basta, MD Inpatient   03/01/2015 2112 03/10/2015 2017 Full Code [id number]  Demetrios Loll, MD Inpatient   12/24/2014 0527 12/25/2014 1832 DNR [id number]  Lance Coon, MD Inpatient        IV Access:    Peripheral IV   Procedures and diagnostic studies:   Dg Chest 2 View  Result Date: 10/22/2018 CLINICAL DATA:  Initial evaluation status post pacer placement, history of femur fracture. EXAM: CHEST - 2 VIEW COMPARISON:  Prior radiograph from 10/21/2018 FINDINGS: Cardiac and mediastinal silhouettes are stable in size and contour, and remain within normal limits. Dual lead left-sided transvenous pacemaker/AICD stable in position and alignment with electrodes overlying the right atrium and right ventricle. Chronic elevation of the right hemidiaphragm, stable. Associated mild bibasilar atelectasis. No other new focal airspace disease. No overt pulmonary edema or significant pleural effusion. No pneumothorax. Osseous structures are unchanged. Sequelae of prior vertebral augmentation noted at L1. Age indeterminate proximal right humeral fracture again noted. IMPRESSION: 1. Stable appearance of the chest with chronic elevation of the right hemidiaphragm and mild bibasilar atelectasis. 2. No other new active cardiopulmonary disease. Electronically Signed   By: Jeannine Boga M.D.   On: 10/22/2018 05:28   Chest Portable 1 View  Result Date: 10/21/2018 CLINICAL DATA:  Preoperative evaluation for femur fracture EXAM: PORTABLE CHEST 1 VIEW  COMPARISON:  October 20, 2018 FINDINGS: There is persistent elevation the right hemidiaphragm with atelectatic change in the right base. There is no edema or consolidation. Heart size and pulmonary vascularity within normal limits. There is no a pacemaker present with lead tips attached to the right atrium and right ventricle. Loop recorder no longer appreciable. No adenopathy. There is age uncertain trauma involving the proximal right humerus. IMPRESSION: Persistent elevation right hemidiaphragm with atelectasis right base. No frank airspace consolidation. Heart size within normal limits. Pacemaker now present with lead tips attached to right atrium and right ventricle. Age uncertain trauma proximal right humerus. Electronically Signed   By: Lowella Grip III M.D.   On: 10/21/2018 20:47   Dg Knee Right Port  Result Date: 10/22/2018 CLINICAL DATA:  ORIF right knee EXAM: PORTABLE RIGHT KNEE - 1-2 VIEW COMPARISON:  None. FINDINGS: An intramedullary rod has been placed through the femur, crossing the distal femoral fracture with distal interlocking screws. The patient is status post knee replacement. The femoral and tibial components are in good position. No other fractures are noted. IMPRESSION: ORIF of the distal femoral fracture. Knee replacement hardware is in good position. Electronically Signed   By: Dorise Bullion III M.D   On: 10/22/2018 13:33   Dg C-arm 1-60 Min  Result Date: 10/22/2018 CLINICAL DATA:  Right femoral nail  placement. FLUOROSCOPY TIME:  2 minutes and 34 seconds. Images: 5 EXAM: DG C-ARM 61-120 MIN COMPARISON:  None. FINDINGS: A femoral rod has been placed across the distal femoral fracture extending from the proximal femur to the distal femur with proximal and distal interlocking screws. IMPRESSION: Rod placement across the distal femoral fracture as above. Electronically Signed   By: Dorise Bullion III M.D   On: 10/22/2018 13:31   Dg Femur Port, Min 2 Views Right  Result Date:  10/22/2018 CLINICAL DATA:  ORIF of distal femoral fracture. EXAM: RIGHT FEMUR PORTABLE 2 VIEW COMPARISON:  None. FINDINGS: A rod has been placed through the right femur, crossing the distal femoral fracture. Proximal and distal interlocking screws are identified. The patient is also status post knee replacement. Hardware is in good position. IMPRESSION: ORIF of the right femur as above. Electronically Signed   By: Dorise Bullion III M.D   On: 10/22/2018 13:33   Dg Femur Port, Min 2 Views Right  Result Date: 10/22/2018 CLINICAL DATA:  Fracture repair. FLUOROSCOPY TIME:  2 minutes and 34 seconds. Images: 5 EXAM: RIGHT FEMUR PORTABLE 2 VIEW COMPARISON:  None. FINDINGS: An intramedullary rod has been placed through the femur, crossing the distal femoral fracture, with proximal and distal interlocking screws. IMPRESSION: Femoral rod placement as above. Electronically Signed   By: Dorise Bullion III M.D   On: 10/22/2018 13:32     Medical Consultants:    None.  Anti-Infectives:    Subjective:    Meghan [name] has no complains  Objective:    Vitals:   10/22/18 1545 10/22/18 2015 10/23/18 0420 10/23/18 0852  BP: 127/76 126/61 122/82 119/62  Pulse: (!) 115 66 72 90  Resp: 16 17 17 18   Temp:  98.9 F (37.2 C) 98 F (36.7 C) 98.6 F (37 C)  TempSrc:  Oral Oral Oral  SpO2: 92% 92% 92% 97%    Intake/Output Summary (Last 24 hours) at 10/23/2018 1030 Last data filed at 10/23/2018 0355 Gross per 24 hour  Intake 2230 ml  Output 1300 ml  Net 930 ml   There were no vitals filed for this visit.  Exam: General exam: In no acute distress. Respiratory system: Good air movement and clear to auscultation. Cardiovascular system: S1 & S2 heard, RRR. Marland Kitchen  Gastrointestinal system: Abdomen is nondistended, soft and nontender.  Central nervous system: Alert and oriented. No focal neurological deficits. Extremities: No pedal edema, left upper extremity in brace Skin: No rashes, lesions or  ulcers Psychiatry: Judgement and insight appear normal. Mood & affect appropriate.    Data Reviewed:    Labs: Basic Metabolic Panel: Recent Labs  Lab 10/21/18 0100 10/21/18 0608 10/22/18 0306  NA 140 141 138  K 3.7 3.7 3.7  CL 108 110 109  CO2 22 22 23   GLUCOSE 91 96 89  BUN 15 18 15   CREATININE 0.81 0.89 0.75  CALCIUM 7.6* 7.7* 7.3*   GFR Estimated Creatinine Clearance: 48.4 mL/min (by C-G formula based on SCr of 0.75 mg/dL). Liver Function Tests: Recent Labs  Lab 10/21/18 0100 10/21/18 0608  AST 32 27  ALT 34 31  ALKPHOS 62 62  BILITOT 1.3* 1.3*  PROT 5.9* 6.0*  ALBUMIN 3.2* 3.0*   No results for input(s): LIPASE, AMYLASE in the last 168 hours. No results for input(s): AMMONIA in the last 168 hours. Coagulation profile Recent Labs  Lab 10/21/18 0411  INR 1.2    CBC: Recent Labs  Lab 10/21/18 0100 10/21/18 8938  10/22/18 0306 10/23/18 0610  WBC 10.0 10.4 6.6 6.2  NEUTROABS 7.5  --   --   --   HGB 11.2* 11.2* 10.5* 8.7*  HCT 34.2* 34.9* 31.6* 27.8*  MCV 88.6 91.1 89.0 90.8  PLT 262 240 189 160   Cardiac Enzymes: Recent Labs  Lab 10/21/18 0100 10/21/18 0631  TROPONINI <0.03 <0.03   BNP (last 3 results) No results for input(s): PROBNP in the last 8760 hours. CBG: Recent Labs  Lab 10/22/18 0842 10/22/18 1307 10/22/18 1640 10/22/18 2201 10/23/18 0850  GLUCAP 88 154* 181* 191* 137*   D-Dimer: No results for input(s): DDIMER in the last 72 hours. Hgb A1c: No results for input(s): HGBA1C in the last 72 hours. Lipid Profile: No results for input(s): CHOL, HDL, LDLCALC, TRIG, CHOLHDL, LDLDIRECT in the last 72 hours. Thyroid function studies: No results for input(s): TSH, T4TOTAL, T3FREE, THYROIDAB in the last 72 hours.  Invalid input(s): FREET3 Anemia work up: No results for input(s): VITAMINB12, FOLATE, FERRITIN, TIBC, IRON, RETICCTPCT in the last 72 hours. Sepsis Labs: Recent Labs  Lab 10/21/18 0100 10/21/18 0608 10/22/18 0306  10/23/18 0610  WBC 10.0 10.4 6.6 6.2   Microbiology Recent Results (from the past 240 hour(s))  Urine Culture     Status: Abnormal   Collection Time: 10/21/18  4:12 AM  Result Value Ref Range Status   Specimen Description   Final    URINE, RANDOM Performed at [facility], [address], [place], [place] [address]    Special Requests   Final    Normal Performed at [facility], [address] [place], [place] [address]    Culture MULTIPLE SPECIES PRESENT, SUGGEST RECOLLECTION (A)  Final   Report Status 10/22/2018 FINAL  Final  MRSA PCR Screening     Status: Abnormal   Collection Time: 10/21/18  9:53 AM  Result Value Ref Range Status   MRSA by PCR POSITIVE (A) NEGATIVE Final    Comment:        The GeneXpert MRSA Assay (FDA approved for NASAL specimens only), is one component of a comprehensive MRSA colonization surveillance program. It is not intended to diagnose MRSA infection nor to guide or monitor treatment for MRSA infections. RESULT CALLED TO, READ BACK BY AND VERIFIED WITH: STACEY POLLY @1123  ON 10/21/2018 BY FMW Performed at [facility], [address] [place], [place] [address]      Medications:   . acetaminophen  500 mg Oral Q6H  . busPIRone  7.5 mg Oral BID  . clonazepam  0.125 mg Oral BID  . docusate sodium  100 mg Oral BID  . enoxaparin (LOVENOX) injection  40 mg Subcutaneous Q24H  . escitalopram  10 mg Oral q morning - 10a  . feeding supplement (ENSURE ENLIVE)  237 mL Oral BID BM  . ferrous sulfate  325 mg Oral TID PC  . hydrocortisone sod succinate (SOLU-CORTEF) inj  25 mg Intravenous Q8H  . insulin aspart  0-5 Units Subcutaneous QHS  . insulin aspart  0-9 Units Subcutaneous TID WC  . mirtazapine  7.5 mg Oral QHS  . multivitamin with minerals  1 tablet Oral Daily  . pantoprazole  40 mg Oral Daily  . pramipexole  0.25 mg Oral Daily  . topiramate  25 mg Oral QHS  . vitamin B-12  500 mcg Oral Daily   Continuous  Infusions: . 0.9 % NaCl with KCl 20 mEq / L 75 mL/hr at 10/22/18 1735  LOS: 2 days   Charlynne Cousins  [facility]  10/23/2018, 10:30 AM

## 2018-10-23 NOTE — Plan of Care (Signed)
  Problem: Education: Goal: Knowledge of General Education information will improve Description: Including pain rating scale, medication(s)/side effects and non-pharmacologic comfort measures Outcome: Progressing   Problem: Health Behavior/Discharge Planning: Goal: Ability to manage health-related needs will improve Outcome: Progressing   Problem: Clinical Measurements: Goal: Respiratory complications will improve Outcome: Progressing Goal: Cardiovascular complication will be avoided Outcome: Progressing   Problem: Activity: Goal: Risk for activity intolerance will decrease Outcome: Progressing   Problem: Nutrition: Goal: Adequate nutrition will be maintained Outcome: Progressing   Problem: Coping: Goal: Level of anxiety will decrease Outcome: Progressing   Problem: Elimination: Goal: Will not experience complications related to bowel motility Outcome: Progressing Goal: Will not experience complications related to urinary retention Outcome: Progressing   Problem: Pain Managment: Goal: General experience of comfort will improve Outcome: Progressing   Problem: Safety: Goal: Ability to remain free from injury will improve Outcome: Progressing   Problem: Skin Integrity: Goal: Risk for impaired skin integrity will decrease Outcome: Progressing   

## 2018-10-23 NOTE — Evaluation (Signed)
Physical Therapy Evaluation Patient Details Name: Meghan [name] MRN: [id number] DOB: 06-06-1946 Today's Date: 10/23/2018   History of Present Illness    73 y.o. female past medical history of rheumatoid arthritis bipolar disorder diabetes mellitus type 2 peptic ulcer disease COPD presents with [facility] on 10/20/2018 after a fall due to syncopal episode. She was found to be in complete heart block and diagnosed with periprosthetic distal femur fracture.  Dual pacemaker placed 3/13, ORIF/IM rod distal femur 3/14.   Clinical Impression  Pt presents with significant mobility deficits due to pain, restricted ROM, restricted weight bearing, weakness, resulting in dependencies in all basic mobility tasks when changing positions and moving around.  Pt will need postacute rehab to address deficits and return pt to prior activity level.  PT will initiate care in acute setting and assist with d/c as needed.     Follow Up Recommendations SNF    Equipment Recommendations  Rolling walker with 5" wheels(YOUTH SIZE: Pt is 4'7" tall)    Recommendations for Other Services       Precautions / Restrictions Precautions Precautions: Fall;Knee;ICD/Pacemaker Precaution Booklet Issued: No Precaution Comments: RLE: TTWB, KI at all times; LUE: s/p PPM, sling and avoid abd/erot/retraction Required Braces or Orthoses: Sling;Knee Immobilizer - Right Knee Immobilizer - Right: On at all times(unclear in orders, just says "continue" in notes) Restrictions Weight Bearing Restrictions: Yes RLE Weight Bearing: Partial weight bearing RLE Partial Weight Bearing Percentage or Pounds: toe touch only      Mobility  Bed Mobility Overal bed mobility: Needs Assistance Bed Mobility: Rolling;Supine to Sit;Sit to Supine Rolling: Mod assist;+2 for safety/equipment   Supine to sit: Mod assist;+2 for physical assistance;HOB elevated Sit to supine: Max assist;+2 for physical assistance   General bed mobility  comments: pt requires assist for positional changes due to LUE+RLE deficits and pain management.    Transfers Overall transfer level: Needs assistance Equipment used: Rolling walker (2 wheeled) Transfers: Sit to/from Stand Sit to Stand: Max assist         General transfer comment: assist to scoot out far enough to reach floor and still not slide off bed (pt is 4'7"); instruction for TTWB, pt able to shift weight/load to intact LLE and stand with moderate assist for lift off and accurate descent/landing  Ambulation/Gait             General Gait Details: pt not able to attempt gait with RUE+LLE deficits to manage  Stairs            Wheelchair Mobility    Modified Rankin (Stroke Patients Only)       Balance Overall balance assessment: History of Falls;Mild deficits observed, not formally tested                                           Pertinent Vitals/Pain Pain Assessment: 0-10 Pain Score: 7  Pain Location: RLE Pain Descriptors / Indicators: Aching;Sore;Grimacing;Guarding Pain Intervention(s): Limited activity within patient's tolerance;Monitored during session;Premedicated before session;Repositioned;Ice applied    Home Living Family/patient expects to be discharged to:: Skilled nursing facility                 Additional Comments: prefers [facility]/field seismologist in [place]    Prior Function Level of Independence: Independent with assistive device(s)         Comments: was admit from SNF unclear why was there, but  prefers to return for rehab     Hand Dominance   Dominant Hand: Right    Extremity/Trunk Assessment   Upper Extremity Assessment Upper Extremity Assessment: Overall WFL for tasks assessed;LUE deficits/detail LUE Deficits / Details: pacemaker placemtn precautions: avoid abd/ext rot/retraction or LUE    Lower Extremity Assessment Lower Extremity Assessment: RLE deficits/detail;Generalized weakness RLE Deficits /  Details: s/p IM nail to distal femur, TTWB and KI all times       Communication   Communication: No difficulties  Cognition Arousal/Alertness: Awake/alert Behavior During Therapy: WFL for tasks assessed/performed Overall Cognitive Status: Within Functional Limits for tasks assessed                                        General Comments      Exercises     Assessment/Plan    PT Assessment Patient needs continued PT services  PT Problem List Pain;Decreased strength;Decreased range of motion;Decreased activity tolerance;Decreased balance;Decreased mobility;Decreased knowledge of use of DME;Obesity       PT Treatment Interventions Patient/family education;Wheelchair mobility training;DME instruction;Gait training;Functional mobility training;Therapeutic activities;Therapeutic exercise;Balance training;Modalities    PT Goals (Current goals can be found in the Care Plan section)  Acute Rehab PT Goals Patient Stated Goal: return to prior activity level PT Goal Formulation: With patient/family Time For Goal Achievement: 11/06/18 Potential to Achieve Goals: Good    Frequency Min 2X/week   Barriers to discharge Decreased caregiver support      Co-evaluation               AM-PAC PT "6 Clicks" Mobility  Outcome Measure Help needed turning from your back to your side while in a flat bed without using bedrails?: A Lot Help needed moving from lying on your back to sitting on the side of a flat bed without using bedrails?: A Lot Help needed moving to and from a bed to a chair (including a wheelchair)?: A Lot Help needed standing up from a chair using your arms (e.g., wheelchair or bedside chair)?: A Lot Help needed to walk in hospital room?: A Lot Help needed climbing 3-5 steps with a railing? : Total 6 Click Score: 11    End of Session Equipment Utilized During Treatment: Gait belt Activity Tolerance: Patient tolerated treatment well;Patient limited by  fatigue Patient left: in bed;with call bell/phone within reach;with family/visitor present Nurse Communication: Mobility status;Patient requests pain meds;Precautions PT Visit Diagnosis: Pain;Difficulty in walking, not elsewhere classified (R26.2) Pain - Right/Left: Right Pain - part of body: Knee    Time: 1235-1310 PT Time Calculation (min) (ACUTE ONLY): 35 min   Charges:   PT Evaluation $PT Eval Moderate Complexity: 1 Mod PT Treatments $Therapeutic Activity: 23-37 mins        Kearney Hard, PT, DPT, MS Board Certified Geriatric Clinical Specialist  Herbie Drape 10/23/2018, 2:07 PM

## 2018-10-23 NOTE — Progress Notes (Addendum)
Patient ID: Meghan [name], female   DOB: July 11, 1946, 73 y.o.   MRN: [id number]     Subjective:  Patient reports pain as mild to moderate.  Patient in bed and in no acute distress.  Follows commands and is alert. Spoke with the daughter on the phone while in the room with the patient.  Objective:   VITALS:   Vitals:   10/22/18 1545 10/22/18 2015 10/23/18 0420 10/23/18 0852  BP: 127/76 126/61 122/82 119/62  Pulse: (!) 115 66 72 90  Resp: 16 17 17 18   Temp:  98.9 F (37.2 C) 98 F (36.7 C) 98.6 F (37 C)  TempSrc:  Oral Oral Oral  SpO2: 92% 92% 92% 97%    ABD soft Sensation intact distally Dorsiflexion/Plantar flexion intact Incision: dressing C/D/I and no drainage   Lab Results  Component Value Date   WBC 6.2 10/23/2018   HGB 8.7 (L) 10/23/2018   HCT 27.8 (L) 10/23/2018   MCV 90.8 10/23/2018   PLT 160 10/23/2018   BMET    Component Value Date/Time   NA 138 10/22/2018 0306   NA 135 05/05/2016 1010   NA 139 11/29/2012 1544   K 3.7 10/22/2018 0306   K 4.0 11/29/2012 1544   CL 109 10/22/2018 0306   CL 107 11/29/2012 1544   CO2 23 10/22/2018 0306   CO2 23 11/29/2012 1544   GLUCOSE 89 10/22/2018 0306   GLUCOSE 142 (H) 11/29/2012 1544   BUN 15 10/22/2018 0306   BUN 7 (L) 05/05/2016 1010   BUN 24 (H) 11/29/2012 1544   CREATININE 0.75 10/22/2018 0306   CREATININE 1.26 11/29/2012 1544   CALCIUM 7.3 (L) 10/22/2018 0306   CALCIUM 9.1 11/29/2012 1544   GFRNONAA >60 10/22/2018 0306   GFRNONAA 44 (L) 11/29/2012 1544   GFRAA >60 10/22/2018 0306   GFRAA 51 (L) 11/29/2012 1544     Assessment/Plan: 1 Day Post-Op   Principal Problem:   Closed fracture of distal end of right femur ([place]), periprosthetic Active Problems:   Severe bipolar disorder with psychotic features, mood-congruent ([facility])   H/O gastric ulcer   HTN (hypertension)   Chronic obstructive pulmonary disease (H[facility])   Diabetes mellitus ([place])   Seropositive rheumatoid arthritis ([place])   Complete heart block  (H[facility])   Advance diet Up with therapy Discharge to SNF when cleared with Medicine TTWB right lower ext Continue knee immobilizer Dry dressing PRN   Lunette Stands 10/23/2018, 10:22 AM  Discussed and agree with above.  ABLA monitor  Marchia Bond, MD Cell [phone]

## 2018-10-23 NOTE — Progress Notes (Signed)
Progress Note  [name] Date of Encounter: 10/23/2018  Primary Cardiologist: [facility]  Primary Electrophysiologist: [facility]   Patient Profile     73 y.o. female WITH Recurrent syncope and documented haert block transferred 3/13 for pacemaker prior to surgical repair of fractured femur   Subjective   Shoulder soreness, leg feels better after surgery  Also hurts    Inpatient Medications    Scheduled Meds: . acetaminophen  500 mg Oral Q6H  . busPIRone  7.5 mg Oral BID  . clonazepam  0.125 mg Oral BID  . docusate sodium  100 mg Oral BID  . enoxaparin (LOVENOX) injection  40 mg Subcutaneous Q24H  . escitalopram  10 mg Oral q morning - 10a  . feeding supplement (ENSURE ENLIVE)  237 mL Oral BID BM  . ferrous sulfate  325 mg Oral TID PC  . hydrocortisone sod succinate (SOLU-CORTEF) inj  25 mg Intravenous Q8H  . insulin aspart  0-5 Units Subcutaneous QHS  . insulin aspart  0-9 Units Subcutaneous TID WC  . mirtazapine  7.5 mg Oral QHS  . multivitamin with minerals  1 tablet Oral Daily  . pantoprazole  40 mg Oral Daily  . pramipexole  0.25 mg Oral Daily  . topiramate  25 mg Oral QHS  . vitamin B-12  500 mcg Oral Daily   Continuous Infusions: . 0.9 % NaCl with KCl 20 mEq / L 75 mL/hr at 10/22/18 1735   PRN Meds: acetaminophen, alum & mag hydroxide-simeth, bisacodyl, HYDROcodone-acetaminophen, HYDROcodone-acetaminophen, ipratropium-albuterol, magnesium citrate, menthol-cetylpyridinium **OR** phenol, metoCLOPramide **OR** metoCLOPramide (REGLAN) injection, morphine injection, ondansetron **OR** ondansetron (ZOFRAN) IV, polyethylene glycol   Vital Signs    Vitals:   10/22/18 1500 10/22/18 1545 10/22/18 2015 10/23/18 0420  BP:  127/76 126/61 122/82  Pulse: (!) 112 (!) 115 66 72  Resp: 15 16 17 17   Temp: 97.7 F (36.5 C)  98.9 F (37.2 C) 98 F (36.7 C)  TempSrc:   Oral Oral  SpO2: 100% 92% 92% 92%    Intake/Output Summary (Last 24 hours) at 10/23/2018 0855 Last  data filed at 10/23/2018 0355 Gross per 24 hour  Intake 2230 ml  Output 1300 ml  Net 930 ml   There were no vitals filed for this visit.  Telemetry    Telemetry Personally reviewed  P-synchronous/ AV  pacing   ECG     Physical Exam    Well developed and nourished in no acute distress HENT normal Neck supple   Pocket without hematoma, swelling  mild tenderness  Regular rate and rhythm, no murmurs or gallops Skin-warm and dry A & Oriented  Grossly normal sensory and motor function      Labs    Chemistry Recent Labs  Lab 10/21/18 0100 10/21/18 0608 10/22/18 0306  NA 140 141 138  K 3.7 3.7 3.7  CL 108 110 109  CO2 22 22 23   GLUCOSE 91 96 89  BUN 15 18 15   CREATININE 0.81 0.89 0.75  CALCIUM 7.6* 7.7* 7.3*  PROT 5.9* 6.0*  --   ALBUMIN 3.2* 3.0*  --   AST 32 27  --   ALT 34 31  --   ALKPHOS 62 62  --   BILITOT 1.3* 1.3*  --   GFRNONAA >60 >60 >60  GFRAA >60 >60 >60  ANIONGAP 10 9 6      Hematology Recent Labs  Lab 10/21/18 0608 10/22/18 0306 10/23/18 0610  WBC 10.4 6.6 6.2  RBC 3.83*  3.55* 3.06*  HGB 11.2* 10.5* 8.7*  HCT 34.9* 31.6* 27.8*  MCV 91.1 89.0 90.8  MCH 29.2 29.6 28.4  MCHC 32.1 33.2 31.3  RDW 15.6* 15.7* 15.9*  PLT 240 189 160    Cardiac Enzymes Recent Labs  Lab 10/21/18 0100 10/21/18 0631  TROPONINI <0.03 <0.03   No results for input(s): TROPIPOC in the last 168 hours.   BNPNo results for input(s): BNP, PROBNP in the last 168 hours.   DDimer No results for input(s): DDIMER in the last 168 hours.   Radiology    Dg Chest 2 View  Result Date: 10/22/2018 CLINICAL DATA:  Initial evaluation status post pacer placement, history of femur fracture. EXAM: CHEST - 2 VIEW COMPARISON:  Prior radiograph from 10/21/2018 FINDINGS: Cardiac and mediastinal silhouettes are stable in size and contour, and remain within normal limits. Dual lead left-sided transvenous pacemaker/AICD stable in position and alignment with electrodes overlying the  right atrium and right ventricle. Chronic elevation of the right hemidiaphragm, stable. Associated mild bibasilar atelectasis. No other new focal airspace disease. No overt pulmonary edema or significant pleural effusion. No pneumothorax. Osseous structures are unchanged. Sequelae of prior vertebral augmentation noted at L1. Age indeterminate proximal right humeral fracture again noted. IMPRESSION: 1. Stable appearance of the chest with chronic elevation of the right hemidiaphragm and mild bibasilar atelectasis. 2. No other new active cardiopulmonary disease. Electronically Signed   By: Jeannine Boga M.D.   On: 10/22/2018 05:28   Chest Portable 1 View  Result Date: 10/21/2018 CLINICAL DATA:  Preoperative evaluation for femur fracture EXAM: PORTABLE CHEST 1 VIEW COMPARISON:  October 20, 2018 FINDINGS: There is persistent elevation the right hemidiaphragm with atelectatic change in the right base. There is no edema or consolidation. Heart size and pulmonary vascularity within normal limits. There is no a pacemaker present with lead tips attached to the right atrium and right ventricle. Loop recorder no longer appreciable. No adenopathy. There is age uncertain trauma involving the proximal right humerus. IMPRESSION: Persistent elevation right hemidiaphragm with atelectasis right base. No frank airspace consolidation. Heart size within normal limits. Pacemaker now present with lead tips attached to right atrium and right ventricle. Age uncertain trauma proximal right humerus. Electronically Signed   By: Lowella Grip III M.D.   On: 10/21/2018 20:47   Dg Knee Right Port  Result Date: 10/22/2018 CLINICAL DATA:  ORIF right knee EXAM: PORTABLE RIGHT KNEE - 1-2 VIEW COMPARISON:  None. FINDINGS: An intramedullary rod has been placed through the femur, crossing the distal femoral fracture with distal interlocking screws. The patient is status post knee replacement. The femoral and tibial components are in good  position. No other fractures are noted. IMPRESSION: ORIF of the distal femoral fracture. Knee replacement hardware is in good position. Electronically Signed   By: Dorise Bullion III M.D   On: 10/22/2018 13:33   Dg C-arm 1-60 Min  Result Date: 10/22/2018 CLINICAL DATA:  Right femoral nail placement. FLUOROSCOPY TIME:  2 minutes and 34 seconds. Images: 5 EXAM: DG C-ARM 61-120 MIN COMPARISON:  None. FINDINGS: A femoral rod has been placed across the distal femoral fracture extending from the proximal femur to the distal femur with proximal and distal interlocking screws. IMPRESSION: Rod placement across the distal femoral fracture as above. Electronically Signed   By: Dorise Bullion III M.D   On: 10/22/2018 13:31   Dg Femur Port, Min 2 Views Right  Result Date: 10/22/2018 CLINICAL DATA:  ORIF of distal femoral fracture. EXAM:  RIGHT FEMUR PORTABLE 2 VIEW COMPARISON:  None. FINDINGS: A rod has been placed through the right femur, crossing the distal femoral fracture. Proximal and distal interlocking screws are identified. The patient is also status post knee replacement. Hardware is in good position. IMPRESSION: ORIF of the right femur as above. Electronically Signed   By: Dorise Bullion III M.D   On: 10/22/2018 13:33   Dg Femur Port, Min 2 Views Right  Result Date: 10/22/2018 CLINICAL DATA:  Fracture repair. FLUOROSCOPY TIME:  2 minutes and 34 seconds. Images: 5 EXAM: RIGHT FEMUR PORTABLE 2 VIEW COMPARISON:  None. FINDINGS: An intramedullary rod has been placed through the femur, crossing the distal femoral fracture, with proximal and distal interlocking screws. IMPRESSION: Femoral rod placement as above. Electronically Signed   By: Dorise Bullion III M.D   On: 10/22/2018 13:32   Chest Xray personally reviewed  Leads stable     Device Interrogation    Normal device function    Assessment & Plan    Complete heart block  Syncope recurrent  PM St Jude implanted 3/13  Loop recorder  explant  SCAF (subclinical Atrial fibrillation < 2 hr)   Wound looks good Some dried blood on loop explant site       Signed, Virl Axe, MD  10/23/2018, 8:55 AM

## 2018-10-23 NOTE — Progress Notes (Signed)
This nurse went to D/C pts foley catheter however, she refused to have it taken out. "I want to leave it in until later". Receiving nurse notified.

## 2018-10-24 ENCOUNTER — Encounter (HOSPITAL_COMMUNITY): Payer: Self-pay | Admitting: Internal Medicine

## 2018-10-24 DIAGNOSIS — Z01818 Encounter for other preprocedural examination: Secondary | ICD-10-CM

## 2018-10-24 LAB — CBC
HCT: 26.6 % — ABNORMAL LOW (ref 36.0–46.0)
Hemoglobin: 8.8 g/dL — ABNORMAL LOW (ref 12.0–15.0)
MCH: 29.7 pg (ref 26.0–34.0)
MCHC: 33.1 g/dL (ref 30.0–36.0)
MCV: 89.9 fL (ref 80.0–100.0)
NRBC: 0 % (ref 0.0–0.2)
Platelets: 161 10*3/uL (ref 150–400)
RBC: 2.96 MIL/uL — ABNORMAL LOW (ref 3.87–5.11)
RDW: 16.3 % — ABNORMAL HIGH (ref 11.5–15.5)
WBC: 4.1 10*3/uL (ref 4.0–10.5)

## 2018-10-24 LAB — BASIC METABOLIC PANEL
Anion gap: 3 — ABNORMAL LOW (ref 5–15)
BUN: 6 mg/dL — ABNORMAL LOW (ref 8–23)
CO2: 22 mmol/L (ref 22–32)
Calcium: 7.3 mg/dL — ABNORMAL LOW (ref 8.9–10.3)
Chloride: 116 mmol/L — ABNORMAL HIGH (ref 98–111)
Creatinine, Ser: 0.53 mg/dL (ref 0.44–1.00)
GFR calc Af Amer: >60 mL/min (ref >60)
GFR calc non Af Amer: >60 mL/min (ref >60)
Glucose, Bld: 130 mg/dL — ABNORMAL HIGH (ref 70–99)
Potassium: 3.6 mmol/L (ref 3.5–5.1)
Sodium: 141 mmol/L (ref 135–145)

## 2018-10-24 LAB — GLUCOSE, CAPILLARY
GLUCOSE-CAPILLARY: 137 mg/dL — AB (ref 70–99)
Glucose-Capillary: 120 mg/dL — ABNORMAL HIGH (ref 70–99)
Glucose-Capillary: 134 mg/dL — ABNORMAL HIGH (ref 70–99)
Glucose-Capillary: 133 mg/dL — ABNORMAL HIGH (ref 70–99)

## 2018-10-24 MED ORDER — POTASSIUM CHLORIDE CRYS ER 20 MEQ PO TBCR: 20.0000 meq | EXTENDED_RELEASE_TABLET | Freq: Two times a day (BID) | ORAL | Status: DC

## 2018-10-24 MED ORDER — LAMOTRIGINE 100 MG PO TABS
100.0000 mg | ORAL_TABLET | Freq: Every day | ORAL | Status: DC
  Administered 2018-10-24: 100 mg via ORAL
  Filled 2018-10-24: qty 1

## 2018-10-24 MED ORDER — OXYCODONE-ACETAMINOPHEN 5-325 MG PO TABS: 1.0000 | ORAL_TABLET | ORAL | 0 refills | Status: DC | PRN

## 2018-10-24 MED ORDER — VITAMIN D 25 MCG (1000 UNIT) PO TABS
5000.0000 [IU] | ORAL_TABLET | ORAL | Status: DC
  Administered 2018-10-24: 5000 [IU] via ORAL
  Filled 2018-10-24: qty 5

## 2018-10-24 MED ORDER — PREDNISONE 5 MG PO TABS: 5.0000 mg | ORAL_TABLET | Freq: Two times a day (BID) | ORAL | Status: DC

## 2018-10-24 MED ORDER — PREDNISONE 5 MG PO TABS
5.0000 mg | ORAL_TABLET | Freq: Every day | ORAL | Status: DC
  Administered 2018-10-24: 5 mg via ORAL
  Filled 2018-10-24: qty 1

## 2018-10-24 MED ORDER — DIPHENOXYLATE-ATROPINE 2.5-0.025 MG PO TABS
1.0000 | ORAL_TABLET | Freq: Four times a day (QID) | ORAL | Status: DC | PRN
  Administered 2018-10-24: 1 via ORAL
  Filled 2018-10-24: qty 1

## 2018-10-24 NOTE — TOC Initial Note (Signed)
Transition of Care [facility]) - Initial/Assessment Note    Patient Details  Name: Meghan [name] MRN: [id number] Date of Birth: 18-Mar-1946  [facility] CM/SW Contact:    Alberteen Sam, LCSW Phone Number: 10/24/2018, 2:52 PM  Clinical Narrative:          CSW spoke with patient family who reports patient is from [facility] and will return. CSW explained insurance process with [place] [facility] and are understanding of PT recommendation of SNF.            Expected Discharge Plan: Skilled Nursing Facility Barriers to Discharge: No Barriers Identified   Patient Goals and CMS Choice Patient states their goals for this hospitalization and ongoing recovery are:: to go back to rehab CMS Medicare.gov Compare Post Acute Care list provided to:: Patient Represenative (must comment)(grandaughter who states she was in charge of medical decisons Danae Chen) Choice offered to / list presented to : Patient  Expected Discharge Plan and Services Expected Discharge Plan: [place] Discharge Planning Services: NA Post Acute Care Choice: [place] Living arrangements for the past 2 months: Skilled Nursing Facility(from [place] [facility] will return)                 DME Arranged: N/A DME Agency: NA HH Arranged: NA [place] Agency: NA  Prior Living Arrangements/Services Living arrangements for the past 2 months: Skilled Nursing Facility(from [place] [facility] will return) Lives with:: Self Patient language and need for interpreter reviewed:: Yes Do you feel safe going back to the place where you live?: Yes(from SNF [facility] will return)      Need for Family Participation in Patient Care: No (Comment) Care giver support system in place?: Yes (comment)   Criminal Activity/Legal Involvement Pertinent to Current Situation/Hospitalization: No - Comment as needed  Activities of Daily Living      Permission Sought/Granted Permission sought to share  information with : Case Manager, Customer service manager, Family Supports Permission granted to share information with : Yes, Verbal Permission Granted  Share Information with NAME: [place] granted to share info w AGENCY: SNFs  Permission granted to share info w Relationship: daughter  Permission granted to share info w Contact Information: [phone]  Emotional Assessment Appearance:: Appears stated age Attitude/Demeanor/Rapport: Unable to Assess Affect (typically observed): Unable to Assess Orientation: : Oriented to Self, Oriented to Place, Oriented to  Time, Oriented to Situation Alcohol / Substance Use: Not Applicable Psych Involvement: No (comment)  Admission diagnosis:  Complete Heart Block  Distal Femur Fx Heart Block Patient Active Problem List   Diagnosis Date Noted  . Complete heart block ([place]) 10/21/2018  . Closed fracture of distal end of right femur ([place]), periprosthetic 10/21/2018  . Palliative care encounter 09/01/2018  . Weakness generalized 09/01/2018  . Shortness of breath 09/01/2018  . Syncope 05/05/2017  . Acute on chronic systolic CHF (congestive heart failure) ([place]) 04/16/2017  . Acute lower UTI 04/16/2017  . Acute respiratory failure ([place]) 04/16/2017  . Pressure injury of skin 03/22/2017  . Acute on chronic respiratory failure with hypoxia ([place]) 03/20/2017  . Acute respiratory failure with hypoxia ([place]) 03/20/2017  . Near syncope 02/27/2017  . Dehydration 02/27/2017  . Colitis 02/22/2017  . Seizure ([place]) 01/03/2016  . Chronic tension-type headache, intractable 12/04/2015  . 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 Degeneration of intervertebral disc of lumbar region 07/15/2015  . Seropositive rheumatoid arthritis ([place]) 07/15/2015  . Asthma with acute exacerbation 03/10/2015  . Hypokalemia  03/01/2015  . Hyponatremia 03/01/2015  . DDD (degenerative disc disease), lumbar 01/31/2015  . Arthritis, degenerative 01/31/2015  . Rheumatoid  arthritis with rheumatoid factor ([place]) 01/31/2015  . HTN (hypertension) 12/24/2014  . Sepsis ([place]) 12/24/2014  . Left knee pain 12/24/2014  . GERD (gastroesophageal reflux disease) 12/24/2014  . COPD (chronic obstructive pulmonary disease) ([place]) 12/24/2014  . Depression 12/24/2014  . Anxiety 12/24/2014  . Severe bipolar disorder with psychotic features, mood-congruent ([place]) 11/16/2014  . Neurosis, posttraumatic 11/16/2014  . H/O gastric ulcer 11/16/2014  . Barton's fracture of distal radius, closed 09/19/2014  . Neuritis or radiculitis due to rupture of lumbar intervertebral disc 05/11/2014  . Cervico-occipital neuralgia 03/26/2014  . Difficulty in walking 03/26/2014  . Difficulty in walking, not elsewhere classified 03/26/2014  . Cervical spine syndrome 01/26/2014  . Cephalalgia 01/09/2014  . Disordered sleep 01/09/2014  . BP (high blood pressure) 10/19/2013  . Adiposity 10/19/2013  . Cardiac murmur 10/19/2013  . Breath shortness 10/19/2013  . Chronic obstructive pulmonary disease ([place]) 10/19/2013  . Diabetes mellitus ([place]) 10/19/2013   PCP:  Donato Schultz, MD Pharmacy:   [facility] [address], [place] [address] Phone: [phone] Fax: [place], [place] [address] [place] [address] Phone: [phone] Fax: [phone]     Social Determinants of Health (SDOH) Interventions    Readmission Risk Interventions 30 Day Unplanned Readmission Risk Score     Admission (Current) from 10/21/2018 in [place]  30 Day Unplanned Readmission Risk Score (%)  27 Filed at 10/24/2018 1200     This score is the patient's risk of an unplanned readmission within 30 days of being discharged (0 -100%). The score is based on dignosis, age, lab data, medications, orders, and past utilization.   Low:  0-14.9   Medium: 15-21.9    High: 22-29.9   Extreme: 30 and above       No flowsheet data found.

## 2018-10-24 NOTE — Progress Notes (Addendum)
Patient ID: Meghan [name], female   DOB: March 22, 1946, 73 y.o.   MRN: [id number]     Subjective:  Patient reports pain as mild to moderate.  Patient in bed and in no acute distress.  She denies any CP or SOB  Objective:   VITALS:   Vitals:   10/23/18 2032 10/23/18 2345 10/24/18 0103 10/24/18 0330  BP: (!) 143/68  122/78 (!) 149/80  Pulse: (!) 109  88 85  Resp: 16  16 16   Temp: 99.2 F (37.3 C)  99.2 F (37.3 C) 97.8 F (36.6 C)  TempSrc: Oral  Oral Oral  SpO2: (!) 87% 91% 92% 99%    ABD soft Sensation intact distally Dorsiflexion/Plantar flexion intact Incision: dressing C/D/I and no drainage   Lab Results  Component Value Date   WBC 4.1 10/24/2018   HGB 8.8 (L) 10/24/2018   HCT 26.6 (L) 10/24/2018   MCV 89.9 10/24/2018   PLT 161 10/24/2018   BMET    Component Value Date/Time   NA 141 10/24/2018 0434   NA 135 05/05/2016 1010   NA 139 11/29/2012 1544   K 3.6 10/24/2018 0434   K 4.0 11/29/2012 1544   CL 116 (H) 10/24/2018 0434   CL 107 11/29/2012 1544   CO2 22 10/24/2018 0434   CO2 23 11/29/2012 1544   GLUCOSE 130 (H) 10/24/2018 0434   GLUCOSE 142 (H) 11/29/2012 1544   BUN 6 (L) 10/24/2018 0434   BUN 7 (L) 05/05/2016 1010   BUN 24 (H) 11/29/2012 1544   CREATININE 0.53 10/24/2018 0434   CREATININE 1.26 11/29/2012 1544   CALCIUM 7.3 (L) 10/24/2018 0434   CALCIUM 9.1 11/29/2012 1544   GFRNONAA >60 10/24/2018 0434   GFRNONAA 44 (L) 11/29/2012 1544   GFRAA >60 10/24/2018 0434   GFRAA 51 (L) 11/29/2012 1544     Assessment/Plan: 2 Days Post-Op   Principal Problem:   Closed fracture of distal end of right femur ([place]), periprosthetic Active Problems:   Severe bipolar disorder with psychotic features, mood-congruent (HCC)   H/O gastric ulcer   HTN (hypertension)   Chronic obstructive pulmonary disease (HCC)   Diabetes mellitus ([place])   Seropositive rheumatoid arthritis ([place])   Complete heart block (HCC)   Advance diet Up with therapy Continue plan per  medicine TTWB right lower Okay for SNF when clear with medicine   Lunette Stands 10/24/2018, 3:32 PM  Discussed and agree with above.   Marchia Bond, MD Cell [phone]

## 2018-10-24 NOTE — Progress Notes (Signed)
[facility] PROGRESS NOTE    Progress Note  Meghan [name]  YDX:[id number] DOB: [birth year] DOA: 10/21/2018 PCP: Donato Schultz, MD     Brief Narrative:   Meghan [name] is an 73 y.o. female past medical history of rheumatoid arthritis bipolar disorder diabetes mellitus type 2 peptic ulcer disease COPD presents with [name] on 10/20/2018 after a fall due to syncopal episode. She was found to be in complete heart block and diagnosed with periprosthetic distal femur fracture cardiology was consulted  Significant events: 10/21/2018 transfer from [place] to [place].  10/21/2018 Pacemaker implanted, loop recorder removed  Assessment/Plan:   Third-degree AV block: EP managing, she is status post pacemaker placement on 10/21/2018. She appears to be stable.  Distal closed fracture of distal end of right femur ([place]), periprosthetic Status post ORIF F on 10/22/2018 by Dr. Mardelle Matte. Awaiting physical therapy evaluation.  Adrenal insufficiency: Currently on home dose of prednisone. Pressure is stable, rating his diet with no hyponatremia.  COPD: Currently stable.  Diabetes mellitus type 2: Once he is 5.8, we have decreased her steroids today so her insulin requirement will be less.    Rheumatoid arthritis: Continue current medication, continue higher dose of steroids due to adrenal insufficiency will titrate down.  Severe bipolar disorder with psychotic features, mood-congruent ([place]); Continue current medication.   RN Pressure Injury Documentation: Pressure Injury 03/21/17 Stage II -  Partial thickness loss of dermis presenting as a shallow open ulcer with a red, pink wound bed without slough. Small area, approx. the size of the tip of a pinky finger. Open skin area under where the pink foam pad is (Active)  03/21/17 2000  Location: Coccyx  Location Orientation: Mid;Lower  Staging: Stage II -  Partial thickness loss of dermis presenting as a shallow open ulcer with a red,  pink wound bed without slough.  Wound Description (Comments): Small area, approx. the size of the tip of a pinky finger. Open skin area under where the pink foam pad is located.   Present on Admission: No     Pressure Injury 08/26/17 Stage I -  Intact skin with non-blanchable redness of a localized area usually over a bony prominence. REDDENED WITH BLANCHABLE (Active)  08/26/17 0624  Location: Coccyx  Location Orientation: Left;Right  Staging: Stage I -  Intact skin with non-blanchable redness of a localized area usually over a bony prominence.  Wound Description (Comments): REDDENED WITH BLANCHABLE  Present on Admission: Yes    Estimated body mass index is 35.61 kg/m as calculated from the following:   Height as of an earlier encounter on 10/21/18: 4\' 7"  (1.397 m).   Weight as of an earlier encounter on 10/21/18: 69.5 kg. Malnutrition Type:  Nutrition Problem: Increased nutrient needs Etiology: hip fracture   Malnutrition Characteristics:  Signs/Symptoms: estimated needs   Nutrition Interventions:  Interventions: Ensure Enlive (each supplement provides 350kcal and 20 grams of protein)    DVT prophylaxis: lovenox Family Communication:daughter Disposition Plan/Barrier to D/C: SNF in 1 days Code Status:     Code Status Orders  (From admission, onward)         Start     Ordered   10/21/18 2024  Full code  Continuous     10/21/18 2025        Code Status History    Date Active Date Inactive Code Status Order ID Comments User Context   10/21/2018 1904 10/21/2018 2025 Full Code [id number]  Deboraha Sprang, MD Inpatient   10/21/2018 [phone]  10/21/2018 1425 DNR [id number]  Christel Mormon, MD Inpatient   10/21/2018 0458 10/21/2018 0856 Full Code [id number]  Christel Mormon, MD Inpatient   08/26/2017 0557 08/28/2017 1500 DNR [id number]  Saundra Shelling, MD Inpatient   08/25/2017 2303 08/26/2017 0557 DNR [id number]  Hinda Kehr, MD ED   06/09/2017 2202 06/15/2017 2046 DNR [id number]  Demetrios Loll,  MD Inpatient   05/05/2017 2023 05/07/2017 2016 Full Code [id number]  Baxter Hire, MD Inpatient   04/16/2017 1015 04/19/2017 2009 DNR [id number]  Vaughan Basta, MD Inpatient   03/20/2017 0405 03/24/2017 2008 Full Code [id number]  Lance Coon, MD Inpatient   02/27/2017 0539 03/04/2017 1814 Full Code [id number]  Saundra Shelling, MD Inpatient   02/22/2017 1547 02/26/2017 2305 Full Code [id number]  Vaughan Basta, MD Inpatient   03/01/2015 2112 03/10/2015 2017 Full Code [id number]  Demetrios Loll, MD Inpatient   12/24/2014 0527 12/25/2014 1832 DNR [id number]  Lance Coon, MD Inpatient        IV Access:    Peripheral IV   Procedures and diagnostic studies:   Dg Knee Right Port  Result Date: 10/22/2018 CLINICAL DATA:  ORIF right knee EXAM: PORTABLE RIGHT KNEE - 1-2 VIEW COMPARISON:  None. FINDINGS: An intramedullary rod has been placed through the femur, crossing the distal femoral fracture with distal interlocking screws. The patient is status post knee replacement. The femoral and tibial components are in good position. No other fractures are noted. IMPRESSION: ORIF of the distal femoral fracture. Knee replacement hardware is in good position. Electronically Signed   By: Dorise Bullion III M.D   On: 10/22/2018 13:33   Dg C-arm 1-60 Min  Result Date: 10/22/2018 CLINICAL DATA:  Right femoral nail placement. FLUOROSCOPY TIME:  2 minutes and 34 seconds. Images: 5 EXAM: DG C-ARM 61-120 MIN COMPARISON:  None. FINDINGS: A femoral rod has been placed across the distal femoral fracture extending from the proximal femur to the distal femur with proximal and distal interlocking screws. IMPRESSION: Rod placement across the distal femoral fracture as above. Electronically Signed   By: Dorise Bullion III M.D   On: 10/22/2018 13:31   Dg Femur Port, Min 2 Views Right  Result Date: 10/22/2018 CLINICAL DATA:  ORIF of distal femoral fracture. EXAM: RIGHT FEMUR PORTABLE 2 VIEW COMPARISON:  None. FINDINGS: A  rod has been placed through the right femur, crossing the distal femoral fracture. Proximal and distal interlocking screws are identified. The patient is also status post knee replacement. Hardware is in good position. IMPRESSION: ORIF of the right femur as above. Electronically Signed   By: Dorise Bullion III M.D   On: 10/22/2018 13:33   Dg Femur Port, Min 2 Views Right  Result Date: 10/22/2018 CLINICAL DATA:  Fracture repair. FLUOROSCOPY TIME:  2 minutes and 34 seconds. Images: 5 EXAM: RIGHT FEMUR PORTABLE 2 VIEW COMPARISON:  None. FINDINGS: An intramedullary rod has been placed through the femur, crossing the distal femoral fracture, with proximal and distal interlocking screws. IMPRESSION: Femoral rod placement as above. Electronically Signed   By: Dorise Bullion III M.D   On: 10/22/2018 13:32     Medical Consultants:    None.  Anti-Infectives:    Subjective:    GIAVONNA PFLUM has no complains  Objective:    Vitals:   10/23/18 2032 10/23/18 2345 10/24/18 0103 10/24/18 0330  BP: (!) 143/68  122/78 (!) 149/80  Pulse: (!) 109  88 85  Resp: 16  16 16   Temp: 99.2 F (37.3 C)  99.2 F (37.3 C) 97.8 F (36.6 C)  TempSrc: Oral  Oral Oral  SpO2: (!) 87% 91% 92% 99%    Intake/Output Summary (Last 24 hours) at 10/24/2018 0850 Last data filed at 10/23/2018 1239 Gross per 24 hour  Intake 480 ml  Output -  Net 480 ml   There were no vitals filed for this visit.  Exam: General exam: In no acute distress. Respiratory system: Good air movement and clear to auscultation. Cardiovascular system: S1 & S2 heard, RRR. Marland Kitchen  Gastrointestinal system: Abdomen is nondistended, soft and nontender.  Central nervous system: Alert and oriented. No focal neurological deficits. Extremities: No pedal edema, left upper extremity in brace Skin: No rashes, lesions or ulcers Psychiatry: Judgement and insight appear normal. Mood & affect appropriate.    Data Reviewed:    Labs: Basic Metabolic  Panel: Recent Labs  Lab 10/21/18 0100 10/21/18 0608 10/22/18 0306 10/23/18 0610 10/24/18 0434  NA 140 141 138 138 141  K 3.7 3.7 3.7 3.8 3.6  CL 108 110 109 110 116*  CO2 22 22 23  20* 22  GLUCOSE 91 96 89 136* 130*  BUN 15 18 15  7* 6*  CREATININE 0.81 0.89 0.75 0.56 0.53  CALCIUM 7.6* 7.7* 7.3* 6.7* 7.3*  MG  --   --   --  1.9  --   PHOS  --   --   --  1.2*  --    GFR Estimated Creatinine Clearance: 48.4 mL/min (by C-G formula based on SCr of 0.53 mg/dL). Liver Function Tests: Recent Labs  Lab 10/21/18 0100 10/21/18 0608 10/23/18 0610  AST 32 27 19  ALT 34 31 22  ALKPHOS 62 62 55  BILITOT 1.3* 1.3* 1.0  PROT 5.9* 6.0* 5.4*  ALBUMIN 3.2* 3.0* 2.9*   No results for input(s): LIPASE, AMYLASE in the last 168 hours. No results for input(s): AMMONIA in the last 168 hours. Coagulation profile Recent Labs  Lab 10/21/18 0411  INR 1.2    CBC: Recent Labs  Lab 10/21/18 0100 10/21/18 0608 10/22/18 0306 10/23/18 0610 10/24/18 0434  WBC 10.0 10.4 6.6 6.2 4.1  NEUTROABS 7.5  --   --   --   --   HGB 11.2* 11.2* 10.5* 8.7* 8.8*  HCT 34.2* 34.9* 31.6* 27.8* 26.6*  MCV 88.6 91.1 89.0 90.8 89.9  PLT 262 240 189 160 161   Cardiac Enzymes: Recent Labs  Lab 10/21/18 0100 10/21/18 0631  TROPONINI <0.03 <0.03   BNP (last 3 results) No results for input(s): PROBNP in the last 8760 hours. CBG: Recent Labs  Lab 10/23/18 0850 10/23/18 1327 10/23/18 1607 10/23/18 2145 10/24/18 0755  GLUCAP 137* 185* 101* 90 134*   D-Dimer: No results for input(s): DDIMER in the last 72 hours. Hgb A1c: Recent Labs    10/23/18 0610  HGBA1C 5.8*   Lipid Profile: No results for input(s): CHOL, HDL, LDLCALC, TRIG, CHOLHDL, LDLDIRECT in the last 72 hours. Thyroid function studies: No results for input(s): TSH, T4TOTAL, T3FREE, THYROIDAB in the last 72 hours.  Invalid input(s): FREET3 Anemia work up: No results for input(s): VITAMINB12, FOLATE, FERRITIN, TIBC, IRON, RETICCTPCT  in the last 72 hours. Sepsis Labs: Recent Labs  Lab 10/21/18 0608 10/22/18 0306 10/23/18 0610 10/24/18 0434  WBC 10.4 6.6 6.2 4.1   Microbiology Recent Results (from the past 240 hour(s))  Urine Culture     Status: Abnormal   Collection Time: 10/21/18  4:12 AM  Result Value Ref Range Status  Specimen Description   Final    URINE, RANDOM Performed at [facility], [address] [place], [place] [address]    Special Requests   Final    Normal Performed at [facility], [address] [place], [place] [address]    Culture MULTIPLE SPECIES PRESENT, SUGGEST RECOLLECTION (A)  Final   Report Status 10/22/2018 FINAL  Final  MRSA PCR Screening     Status: Abnormal   Collection Time: 10/21/18  9:53 AM  Result Value Ref Range Status   MRSA by PCR POSITIVE (A) NEGATIVE Final    Comment:        The GeneXpert MRSA Assay (FDA approved for NASAL specimens only), is one component of a comprehensive MRSA colonization surveillance program. It is not intended to diagnose MRSA infection nor to guide or monitor treatment for MRSA infections. RESULT CALLED TO, READ BACK BY AND VERIFIED WITH: STACEY POLLY @1123  ON 10/21/2018 BY FMW Performed at [facility], [address] [place], [place] [address]      Medications:   . busPIRone  7.5 mg Oral BID  . clonazepam  0.125 mg Oral BID  . docusate sodium  100 mg Oral BID  . enoxaparin (LOVENOX) injection  40 mg Subcutaneous Q24H  . escitalopram  10 mg Oral q morning - 10a  . feeding supplement (ENSURE ENLIVE)  237 mL Oral BID BM  . ferrous sulfate  325 mg Oral TID PC  . hydrocortisone sod succinate (SOLU-CORTEF) inj  25 mg Intravenous Q12H  . insulin aspart  0-5 Units Subcutaneous QHS  . insulin aspart  0-9 Units Subcutaneous TID WC  . mirtazapine  7.5 mg Oral QHS  . multivitamin with minerals  1 tablet Oral Daily  . pantoprazole  40 mg Oral Daily  . pramipexole  0.25 mg Oral Daily  . topiramate  25  mg Oral QHS  . vitamin B-12  500 mcg Oral Daily   Continuous Infusions:     LOS: 3 days   Charlynne Cousins  [facility]  10/24/2018, 8:50 AM

## 2018-10-24 NOTE — Discharge Summary (Addendum)
Physician Discharge Summary  Meghan [name] CNO:[id number] DOB: [birth year] DOA: 10/21/2018  PCP: Donato Schultz, MD  Admit date: 10/21/2018 Discharge date: 10/24/2018  Admitted From: SNF Disposition:  SNF  Recommendations for Outpatient Follow-up:  1. Follow up with PCP in 1-2 weeks 2. Please obtain BMP/CBC in one week. 3. Decrease 1 mg prednisone every 3 days starting 3.16.2020 ntil she get to her home does of 5 mg daily.  Home Health:No Equipment/Devices:None  Discharge Condition:Stable CODE STATUS:Full Diet recommendation: Heart Healthy  Brief/Interim Summary: 73 y.o. female past medical history of rheumatoid arthritis bipolar disorder diabetes mellitus type 2 peptic ulcer disease COPD presents with [facility] on 10/20/2018 after a fall due to syncopal episode. She was found to be in complete heart block and diagnosed with periprosthetic distal femur fracture cardiology was consulted  Discharge Diagnoses:  Principal Problem:   Closed fracture of distal end of right femur ([place]), periprosthetic Active Problems:   Severe bipolar disorder with psychotic features, mood-congruent ([place])   H/O gastric ulcer   HTN (hypertension)   Chronic obstructive pulmonary disease (HCC)   Diabetes mellitus ([place])   Seropositive rheumatoid arthritis ([place])   Complete heart block ([place])  Third-degree AV block: He was admitted to the hospital EP was consulted and performed pacemaker placement on 10/21/2018. She appears to be stable to follow-up with cardiology as an outpatient.  Distal closed fracture of the distal end of the femur: Orthopedic surgery was consulted and she is status post ORIF on 10/22/2018 by Dr. Mardelle Matte physical therapy evaluated the patient recommended skilled nursing facility.  Adrenal insufficiency: She was started on IV steroids, to continue prednisone 5 mg twice a day she will be weaned off slowly to 5 mg she is at home dose. We will decrease 1 mg prednisone every 3  days.  COPD: Currently stable.  Diabetes mellitus type 2: No changes made to her medication.  Rheumatoid arthritis: Continue current current regimen at home she try methotrexate 7.5 mg weekly.  Severe bipolar disorder with psychotic features: Continue current regimen no changes made.  Discharge Instructions  Discharge Instructions    Diet - low sodium heart healthy   Complete by:  As directed    Increase activity slowly   Complete by:  As directed      Allergies as of 10/24/2018      Reactions   Amoxicillin Itching, Other (See Comments)   Has patient had a PCN reaction causing immediate rash, facial/tongue/throat swelling, SOB or lightheadedness with hypotension: Unknown Has patient had a PCN reaction causing severe rash involving mucus membranes or skin necrosis: Unknown Has patient had a PCN reaction that required hospitalization: Unknown Has patient had a PCN reaction occurring within the last 10 years: Unknown If all of the above answers are "NO", then may proceed with Cephalosporin use.   Gabapentin Other (See Comments)   Pt states that it causes her BP to drop.    Naproxen Hives   Nsaids Other (See Comments)   Reaction:  Unknown    Tolmetin Other (See Comments)   Reaction:  Unknown       Medication List    TAKE these medications   busPIRone 7.5 MG tablet Commonly known as:  BUSPAR Take 1 tablet by mouth 2 (two) times daily.   Cholecalciferol 125 MCG (5000 UT) Tabs Take 5,000 Units by mouth every 30 (thirty) days.   clonazePAM 0.5 MG tablet Commonly known as:  KLONOPIN Take 0.125 mg by mouth 2 (two) times daily.  Combivent Respimat 20-100 MCG/ACT Aers respimat Generic drug:  Ipratropium-Albuterol Inhale 2 puffs into the lungs every 6 (six) hours.   diclofenac sodium 1 % Gel Commonly known as:  VOLTAREN Apply 2 g topically 3 (three) times daily. Apply to the fingers   diphenoxylate-atropine 2.5-0.025 MG tablet Commonly known as:  LOMOTIL Take 1  tablet by mouth every 6 (six) hours as needed for diarrhea or loose stools.   escitalopram 10 MG tablet Commonly known as:  LEXAPRO Take 1 tablet (10 mg total) by mouth every morning. What changed:  how much to take   fluticasone 27.5 MCG/SPRAY nasal spray Commonly known as:  VERAMYST Place 2 sprays into the nose 2 (two) times daily.   folic acid 1 MG tablet Commonly known as:  FOLVITE Take 1 mg by mouth daily.   lamoTRIgine 100 MG tablet Commonly known as:  LAMICTAL Take 1 tablet (100 mg total) by mouth daily.   loperamide 2 MG tablet Commonly known as:  IMODIUM A-D Take 1 tablet (2 mg total) by mouth 4 (four) times daily as needed for diarrhea or loose stools. What changed:  when to take this   loratadine 10 MG tablet Commonly known as:  CLARITIN Take 10 mg by mouth daily.   mirtazapine 7.5 MG tablet Commonly known as:  REMERON Take 7.5 mg by mouth at bedtime.   montelukast 10 MG tablet Commonly known as:  SINGULAIR Take 10 mg by mouth daily.   omeprazole 20 MG capsule Commonly known as:  PRILOSEC Take 20 mg by mouth daily.   Onglyza 5 MG Tabs tablet Generic drug:  saxagliptin HCl Take 5 mg by mouth at bedtime.   oxyCODONE-acetaminophen 5-325 MG tablet Commonly known as:  PERCOCET/ROXICET Take 1 tablet by mouth every 4 (four) hours as needed for moderate pain or severe pain.   Potassium Chloride ER 20 MEQ Tbcr Take 20 mEq by mouth 2 (two) times daily.   pramipexole 0.25 MG tablet Commonly known as:  MIRAPEX Take 0.25 mg by mouth daily.   predniSONE 5 MG tablet Commonly known as:  DELTASONE Take 1 tablet (5 mg total) by mouth 2 (two) times daily with a meal. What changed:  when to take this   Tofacitinib Citrate 5 MG Tabs Take 5 mg by mouth 2 (two) times daily after a meal.   topiramate 25 MG tablet Commonly known as:  TOPAMAX Take 25 mg by mouth at bedtime.   vitamin B-12 500 MCG tablet Commonly known as:  CYANOCOBALAMIN Take 500 mcg by mouth  daily.       Contact information for follow-up providers    Marchia Bond, MD. Schedule an appointment as soon as possible for a visit in 2 weeks.   Specialty:  Orthopedic Surgery Contact information: [place] [address] [phone]            Contact information for after-discharge care    [facility] .   Service:  Skilled Nursing Contact information: [place] [phone]                 Allergies  Allergen Reactions  . Amoxicillin Itching and Other (See Comments)    Has patient had a PCN reaction causing immediate rash, facial/tongue/throat swelling, SOB or lightheadedness with hypotension: Unknown Has patient had a PCN reaction causing severe rash involving mucus membranes or skin necrosis: Unknown Has patient had a PCN reaction  that required hospitalization: Unknown Has patient had a PCN reaction occurring within the last 10 years: Unknown If all of the above answers are "NO", then may proceed with Cephalosporin use.   . Gabapentin Other (See Comments)    Pt states that it causes her BP to drop.   . Naproxen Hives  . Nsaids Other (See Comments)    Reaction:  Unknown   . Tolmetin Other (See Comments)    Reaction:  Unknown     Consultations:  Orthopedic surgery  Electrophysiology cardiology   Procedures/Studies: Dg Chest 2 View  Result Date: 10/22/2018 CLINICAL DATA:  Initial evaluation status post pacer placement, history of femur fracture. EXAM: CHEST - 2 VIEW COMPARISON:  Prior radiograph from 10/21/2018 FINDINGS: Cardiac and mediastinal silhouettes are stable in size and contour, and remain within normal limits. Dual lead left-sided transvenous pacemaker/AICD stable in position and alignment with electrodes overlying the right atrium and right ventricle. Chronic elevation of the right hemidiaphragm, stable. Associated mild bibasilar  atelectasis. No other new focal airspace disease. No overt pulmonary edema or significant pleural effusion. No pneumothorax. Osseous structures are unchanged. Sequelae of prior vertebral augmentation noted at L1. Age indeterminate proximal right humeral fracture again noted. IMPRESSION: 1. Stable appearance of the chest with chronic elevation of the right hemidiaphragm and mild bibasilar atelectasis. 2. No other new active cardiopulmonary disease. Electronically Signed   By: Jeannine Boga M.D.   On: 10/22/2018 05:28   Ct Head Wo Contrast  Result Date: 10/20/2018 CLINICAL DATA:  Mechanical fall yesterday hitting head. EXAM: CT HEAD WITHOUT CONTRAST TECHNIQUE: Contiguous axial images were obtained from the base of the skull through the vertex without intravenous contrast. COMPARISON:  04/09/2017 FINDINGS: Brain: Right cerebellar encephalomalacia status post suboccipital craniectomy. Mild age related involutional changes of brain appear stable otherwise. Chronic microvascular ischemic disease of periventricular white matter with chronic right basal ganglial lacunar infarcts are noted. Midline fourth ventricle basal cisterns without effacement. No intra-axial mass nor extra-axial collection. Vascular: Atherosclerosis of the carotid siphons. Skull: No acute skull fracture.  Suboccipital craniectomy on right. Sinuses/Orbits: Intact orbits and globes. Mild ethmoid sinus mucosal thickening. Frothy mucous in the left sphenoid. Maxillary sinuses are clear to the extent included. Other: No significant soft tissue swelling. IMPRESSION: 1. No acute intracranial abnormality. 2. Chronic moderate small vessel ischemia and right basal ganglial lacunar infarct. 3. Status post right suboccipital craniectomy with right cerebellar encephalomalacia. Electronically Signed   By: Ashley Royalty M.D.   On: 10/20/2018 23:56   Chest Portable 1 View  Result Date: 10/21/2018 CLINICAL DATA:  Preoperative evaluation for femur fracture  EXAM: PORTABLE CHEST 1 VIEW COMPARISON:  October 20, 2018 FINDINGS: There is persistent elevation the right hemidiaphragm with atelectatic change in the right base. There is no edema or consolidation. Heart size and pulmonary vascularity within normal limits. There is no a pacemaker present with lead tips attached to the right atrium and right ventricle. Loop recorder no longer appreciable. No adenopathy. There is age uncertain trauma involving the proximal right humerus. IMPRESSION: Persistent elevation right hemidiaphragm with atelectasis right base. No frank airspace consolidation. Heart size within normal limits. Pacemaker now present with lead tips attached to right atrium and right ventricle. Age uncertain trauma proximal right humerus. Electronically Signed   By: Lowella Grip III M.D.   On: 10/21/2018 20:47   Dg Chest Portable 1 View  Result Date: 10/21/2018 CLINICAL DATA:  Patient fell.  Evaluate for fracture. EXAM: PORTABLE CHEST 1 VIEW COMPARISON:  08/26/2017 FINDINGS: AP supine view of the chest. 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 The patient is status post L1 kyphoplasty. Thoracic spondylosis with gentle dextroconvex curvature of the thoracic spine is again noted without change. Multilevel degenerative disc disease with endplate spurring is noted. Loop recording device projects over the left hemithorax. Stable borderline cardiomegaly. No acute pulmonary consolidation or overt pulmonary edema. Aortic atherosclerosis without aneurysm. No acute osseous appearing abnormality. IMPRESSION: Chronic elevation and/or eventration of the right hemidiaphragm. Stable borderline cardiomegaly with aortic atherosclerosis. Electronically Signed   By: Ashley Royalty M.D.   On: 10/21/2018 00:37   Dg Knee Right Port  Result Date: 10/22/2018 CLINICAL DATA:  ORIF right knee EXAM: PORTABLE RIGHT KNEE - 1-2 VIEW COMPARISON:  None. FINDINGS: An  intramedullary rod has been placed through the femur, crossing the distal femoral fracture with distal interlocking screws. The patient is status post knee replacement. The femoral and tibial components are in good position. No other fractures are noted. IMPRESSION: ORIF of the distal femoral fracture. Knee replacement hardware is in good position. Electronically Signed   By: Dorise Bullion III M.D   On: 10/22/2018 13:33   Dg C-arm 1-60 Min  Result Date: 10/22/2018 CLINICAL DATA:  Right femoral nail placement. FLUOROSCOPY TIME:  2 minutes and 34 seconds. Images: 5 EXAM: DG C-ARM 61-120 MIN COMPARISON:  None. FINDINGS: A femoral rod has been placed across the distal femoral fracture extending from the proximal femur to the distal femur with proximal and distal interlocking screws. IMPRESSION: Rod placement across the distal femoral fracture as above. Electronically Signed   By: Dorise Bullion III M.D   On: 10/22/2018 13:31   Dg Femur Min 2 Views Right  Result Date: 10/21/2018 CLINICAL DATA:  Mechanical fall yesterday. EXAM: RIGHT FEMUR 2 VIEWS COMPARISON:  None. FINDINGS: Right total knee arthroplasty. There is a oblique periprosthetic fracture of the distal femoral metaphysis. Medial displacement and overriding of the distal fracture fragments is present. Proximal femur and hip appear intact. Vascular calcifications. IMPRESSION: Oblique periprosthetic fracture of the distal right femoral metaphysis. Right total knee arthroplasty. Electronically Signed   By: Lucienne Capers M.D.   On: 10/21/2018 00:39   Dg Femur Port, [place] 2 Views Right  Result Date: 10/22/2018 CLINICAL DATA:  ORIF of distal femoral fracture. EXAM: RIGHT FEMUR PORTABLE 2 VIEW COMPARISON:  None. FINDINGS: A rod has been placed through the right femur, crossing the distal femoral fracture. Proximal and distal interlocking screws are identified. The patient is also status post knee replacement. Hardware is in good position. IMPRESSION:  ORIF of the right femur as above. Electronically Signed   By: Dorise Bullion III M.D   On: 10/22/2018 13:33   Dg Femur Port, Min 2 Views Right  Result Date: 10/22/2018 CLINICAL DATA:  Fracture repair. FLUOROSCOPY TIME:  2 minutes and 34 seconds. Images: 5 EXAM: RIGHT FEMUR PORTABLE 2 VIEW COMPARISON:  None. FINDINGS: An intramedullary rod has been placed through the femur, crossing the distal femoral fracture, with proximal and distal interlocking screws. IMPRESSION: Femoral rod placement as above. Electronically Signed   By: Dorise Bullion III M.D   On: 10/22/2018 13:32    Subjective: No complaints feels great.  Discharge Exam: Vitals:   10/24/18 0103 10/24/18 0330  BP: 122/78 (!) 149/80  Pulse: 88 85  Resp: 16 16  Temp: 99.2 F (37.3 C) 97.8 F (36.6 C)  SpO2: 92% 99%  General: Pt is alert, awake, not in acute distress Cardiovascular: RRR, S1/S2 +, no rubs, no gallops Respiratory: CTA bilaterally, no wheezing, no rhonchi Abdominal: Soft, NT, ND, bowel sounds + Extremities: no edema, no cyanosis    The results of significant diagnostics from this hospitalization (including imaging, microbiology, ancillary and laboratory) are listed below for reference.     Microbiology: Recent Results (from the past 240 hour(s))  Urine Culture     Status: Abnormal   Collection Time: 10/21/18  4:12 AM  Result Value Ref Range Status   Specimen Description   Final    URINE, RANDOM Performed at [facility], [address], [place], [place] [address]    Special Requests   Final    Normal Performed at [facility], [address] [place], [place] [address]    Culture MULTIPLE SPECIES PRESENT, SUGGEST RECOLLECTION (A)  Final   Report Status 10/22/2018 FINAL  Final  MRSA PCR Screening     Status: Abnormal   Collection Time: 10/21/18  9:53 AM  Result Value Ref Range Status   MRSA by PCR POSITIVE (A) NEGATIVE Final    Comment:        The GeneXpert MRSA Assay  (FDA approved for NASAL specimens only), is one component of a comprehensive MRSA colonization surveillance program. It is not intended to diagnose MRSA infection nor to guide or monitor treatment for MRSA infections. RESULT CALLED TO, READ BACK BY AND VERIFIED WITH: STACEY POLLY @1123  ON 10/21/2018 BY FMW Performed at [facility], [address] [place], [place] [address]      Labs: BNP (last 3 results) No results for input(s): BNP in the last 8760 hours. Basic Metabolic Panel: Recent Labs  Lab 10/21/18 0100 10/21/18 0608 10/22/18 0306 10/23/18 0610 10/24/18 0434  NA 140 141 138 138 141  K 3.7 3.7 3.7 3.8 3.6  CL 108 110 109 110 116*  CO2 22 22 23  20* 22  GLUCOSE 91 96 89 136* 130*  BUN 15 18 15  7* 6*  CREATININE 0.81 0.89 0.75 0.56 0.53  CALCIUM 7.6* 7.7* 7.3* 6.7* 7.3*  MG  --   --   --  1.9  --   PHOS  --   --   --  1.2*  --    Liver Function Tests: Recent Labs  Lab 10/21/18 0100 10/21/18 0608 10/23/18 0610  AST 32 27 19  ALT 34 31 22  ALKPHOS 62 62 55  BILITOT 1.3* 1.3* 1.0  PROT 5.9* 6.0* 5.4*  ALBUMIN 3.2* 3.0* 2.9*   No results for input(s): LIPASE, AMYLASE in the last 168 hours. No results for input(s): AMMONIA in the last 168 hours. CBC: Recent Labs  Lab 10/21/18 0100 10/21/18 0608 10/22/18 0306 10/23/18 0610 10/24/18 0434  WBC 10.0 10.4 6.6 6.2 4.1  NEUTROABS 7.5  --   --   --   --   HGB 11.2* 11.2* 10.5* 8.7* 8.8*  HCT 34.2* 34.9* 31.6* 27.8* 26.6*  MCV 88.6 91.1 89.0 90.8 89.9  PLT 262 240 189 160 161   Cardiac Enzymes: Recent Labs  Lab 10/21/18 0100 10/21/18 0631  TROPONINI <0.03 <0.03   BNP: Invalid input(s): POCBNP CBG: Recent Labs  Lab 10/23/18 1327 10/23/18 1607 10/23/18 2145 10/24/18 0755 10/24/18 1138  GLUCAP 185* 101* 90 134* 137*   D-Dimer No results for input(s): DDIMER in the last 72 hours. Hgb A1c Recent Labs    10/23/18 0610  HGBA1C 5.8*   Lipid Profile  No results for input(s): CHOL,  HDL, LDLCALC, TRIG, CHOLHDL, LDLDIRECT in the last 72 hours. Thyroid function studies No results for input(s): TSH, T4TOTAL, T3FREE, THYROIDAB in the last 72 hours.  Invalid input(s): FREET3 Anemia work up No results for input(s): VITAMINB12, FOLATE, FERRITIN, TIBC, IRON, RETICCTPCT in the last 72 hours. Urinalysis    Component Value Date/Time   COLORURINE YELLOW (A) 10/21/2018 0412   APPEARANCEUR CLEAR (A) 10/21/2018 0412   APPEARANCEUR Turbid (A) 05/05/2016 1010   LABSPEC 1.014 10/21/2018 0412   PHURINE 6.0 10/21/2018 0412   GLUCOSEU NEGATIVE 10/21/2018 0412   HGBUR NEGATIVE 10/21/2018 0412   BILIRUBINUR NEGATIVE 10/21/2018 0412   BILIRUBINUR Negative 05/05/2016 1010   KETONESUR NEGATIVE 10/21/2018 0412   PROTEINUR NEGATIVE 10/21/2018 0412   NITRITE NEGATIVE 10/21/2018 0412   LEUKOCYTESUR LARGE (A) 10/21/2018 0412   Sepsis Labs Invalid input(s): PROCALCITONIN,  WBC,  LACTICIDVEN Microbiology Recent Results (from the past 240 hour(s))  Urine Culture     Status: Abnormal   Collection Time: 10/21/18  4:12 AM  Result Value Ref Range Status   Specimen Description   Final    URINE, RANDOM Performed at [facility], [address], [place], [place] [address]    Special Requests   Final    Normal Performed at [facility], [address] [place], [place] [address]    Culture MULTIPLE SPECIES PRESENT, SUGGEST RECOLLECTION (A)  Final   Report Status 10/22/2018 FINAL  Final  MRSA PCR Screening     Status: Abnormal   Collection Time: 10/21/18  9:53 AM  Result Value Ref Range Status   MRSA by PCR POSITIVE (A) NEGATIVE Final    Comment:        The GeneXpert MRSA Assay (FDA approved for NASAL specimens only), is one component of a comprehensive MRSA colonization surveillance program. It is not intended to diagnose MRSA infection nor to guide or monitor treatment for MRSA infections. RESULT CALLED TO, READ BACK BY AND VERIFIED WITH: STACEY POLLY @1123  ON  10/21/2018 BY FMW Performed at [facility], [address] [place],  [address]      Time coordinating discharge: 40 minutes  SIGNED:   Charlynne Cousins, MD  [facility]

## 2018-10-24 NOTE — Discharge Instructions (Signed)
Diet: As you were doing prior to hospitalization   Shower:  May shower but keep the wounds dry, use an occlusive plastic wrap, NO SOAKING IN TUB.  If the bandage gets wet, change with a clean dry gauze.  If you have a splint on, leave the splint in place and keep the splint dry with a plastic bag.  Dressing:  You may change your dressing 3-5 days after surgery, unless you have a splint.  If you have a splint, then just leave the splint in place and we will change your bandages during your first follow-up appointment.    If you had hand or foot surgery, we will plan to remove your stitches in about 2 weeks in the office.  For all other surgeries, there are sticky tapes (steri-strips) on your wounds and all the stitches are absorbable.  Leave the steri-strips in place when changing your dressings, they will peel off with time, usually 2-3 weeks.  Activity:  Increase activity slowly as tolerated, but follow the weight bearing instructions below.  The rules on driving is that you can not be taking narcotics while you drive, and you must feel in control of the vehicle.    Weight Bearing:   Touch toe weight bearing right leg.    To prevent constipation: you may use a stool softener such as -  Colace (over the counter) 100 mg by mouth twice a day  Drink plenty of fluids (prune juice may be helpful) and high fiber foods Miralax (over the counter) for constipation as needed.    Itching:  If you experience itching with your medications, try taking only a single pain pill, or even half a pain pill at a time.  You may take up to 10 pain pills per day, and you can also use benadryl over the counter for itching or also to help with sleep.   Precautions:  If you experience chest pain or shortness of breath - call 911 immediately for transfer to the hospital emergency department!!  If you develop a fever greater that 101 F, purulent drainage from wound, increased redness or drainage from wound, or calf pain --  Call the office at [phone]                                                Follow- Up Appointment:  Please call for an appointment to be seen in 2 weeks [place] - [phone]      Supplemental Discharge Instructions for  Pacemaker/Defibrillator Patients  Activity No heavy lifting or vigorous activity with your left/right arm for 6 to 8 weeks.  Do not raise your left/right arm above your head for one week.  Gradually raise your affected arm as drawn below.             10/27/2018                 10/28/2018                10/29/2018               10/30/2018 __  NO DRIVING for 1 week or until cleared to by hip surgery instructions, whichever is longer   WOUND CARE - Keep the wound area clean and dry.  Do not get this area wet for one week. No showers for one week or until  cleared to by hip surgery instructions, whichever is longer. - The tape/steri-strips on your wound will fall off; do not pull them off.  No bandage is needed on the site.  DO  NOT apply any creams, oils, or ointments to the wound area. - If you notice any drainage or discharge from the wound, any swelling or bruising at the site, or you develop a fever > 101? F after you are discharged home, call the office at once.  Special Instructions - You are still able to use cellular telephones; use the ear opposite the side where you have your pacemaker/defibrillator.  Avoid carrying your cellular phone near your device. - When traveling through airports, show security personnel your identification card to avoid being screened in the metal detectors.  Ask the security personnel to use the hand wand. - Avoid arc welding equipment, MRI testing (magnetic resonance imaging), TENS units (transcutaneous nerve stimulators).  Call the office for questions about other devices. - Avoid electrical appliances that are in poor condition or are not properly grounded. - Microwave ovens are safe to be near or to operate.

## 2018-10-24 NOTE — Progress Notes (Signed)
Patient from [facility] and facility reports she can return without insurance authorization at this time.   CSW notified MD. Patient can dc to SNF when medically cleared.   [place], [place]

## 2018-10-24 NOTE — NC FL2 (Signed)
[place] LEVEL OF CARE SCREENING TOOL     IDENTIFICATION  Patient Name: SEVEN MARENGO Birthdate: 04/10/1946 Sex: female Admission Date (Current Location): 10/21/2018  [facility] and [place] Number:  Herbalist and Address:  The [place], [place] [address] [place], [place] [address]      Provider Number: [id number]  Attending Physician Name and Address:  Charlynne Cousins, MD  Relative Name and Phone Number:  Durenda Guthrie (daughter)  [phone]    Current Level of Care: Hospital Recommended Level of Care: [place] Prior Approval Number:    Date Approved/Denied:   PASRR Number: [id number]  Discharge Plan: SNF    Current Diagnoses: Patient Active Problem List   Diagnosis Date Noted  . Complete heart block ([place]) 10/21/2018  . Closed fracture of distal end of right femur ([place]), periprosthetic 10/21/2018  . Palliative care encounter 09/01/2018  . Weakness generalized 09/01/2018  . Shortness of breath 09/01/2018  . Syncope 05/05/2017  . Acute on chronic systolic CHF (congestive heart failure) ([place]) 04/16/2017  . Acute lower UTI 04/16/2017  . Acute respiratory failure ([place]) 04/16/2017  . Pressure injury of skin 03/22/2017  . Acute on chronic respiratory failure with hypoxia ([place]) 03/20/2017  . Acute respiratory failure with hypoxia ([place] [address]) 03/20/2017  . Near syncope 02/27/2017  . Dehydration 02/27/2017  . Colitis 02/22/2017  . Seizure ([place]) 01/03/2016  . Chronic tension-type headache, intractable 12/04/2015  . 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 Degeneration of intervertebral disc of lumbar region 07/15/2015  . 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 Asthma with acute exacerbation 03/10/2015  . Hypokalemia 03/01/2015  . Hyponatremia 03/01/2015  . DDD (degenerative disc disease), lumbar 01/31/2015  . Arthritis, degenerative 01/31/2015  . Rheumatoid arthritis with rheumatoid factor ([place]) 01/31/2015   . HTN (hypertension) 12/24/2014  . Sepsis ([place]) 12/24/2014  . Left knee pain 12/24/2014  . GERD (gastroesophageal reflux disease) 12/24/2014  . COPD (chronic obstructive pulmonary disease) ([place]) 12/24/2014  . Depression 12/24/2014  . Anxiety 12/24/2014  . Severe bipolar disorder with psychotic features, mood-congruent ([place]) 11/16/2014  . Neurosis, posttraumatic 11/16/2014  . H/O gastric ulcer 11/16/2014  . Barton's fracture of distal radius, closed 09/19/2014  . Neuritis or radiculitis due to rupture of lumbar intervertebral disc 05/11/2014  . Cervico-occipital neuralgia 03/26/2014  . Difficulty in walking 03/26/2014  . Difficulty in walking, not elsewhere classified 03/26/2014  . Cervical spine syndrome 01/26/2014  . Cephalalgia 01/09/2014  . Disordered sleep 01/09/2014  . BP (high blood pressure) 10/19/2013  . Adiposity 10/19/2013  . Cardiac murmur 10/19/2013  . Breath shortness 10/19/2013  . Chronic obstructive pulmonary disease ([place]) 10/19/2013  . Diabetes mellitus ([place]) 10/19/2013    Orientation RESPIRATION BLADDER Height & Weight     Self, Time, Situation, Place  O2(nasal cannula 2L/min) Continent, External catheter Weight:   Height:     BEHAVIORAL SYMPTOMS/MOOD NEUROLOGICAL BOWEL NUTRITION STATUS      Continent Diet(see discharge summary)  AMBULATORY STATUS COMMUNICATION OF NEEDS Skin   Extensive Assist Verbally Other (Comment), Surgical wounds(ecchymosis left face, MASD right breast and left perineum, left arm skin tear, right leg closed surgical incision)                       Personal Care Assistance Level of Assistance  Bathing, Feeding, Dressing, Total care Bathing Assistance: Maximum assistance Feeding assistance: Independent Dressing Assistance: Maximum assistance Total Care Assistance: Maximum assistance   Functional Limitations Info  Sight,  Hearing, Speech Sight Info: Impaired Hearing Info: Adequate Speech Info: Adequate    SPECIAL CARE FACTORS  FREQUENCY  PT (By licensed PT), OT (By licensed OT)     PT Frequency: min 5x weekly OT Frequency: min 5x weekly            Contractures Contractures Info: Not present    Additional Factors Info  Code Status, Allergies, Isolation Precautions Code Status Info: full Allergies Info: Amoxicillin, Gabapentin, Naproxen, Nsaids, Tolmetin     Isolation Precautions Info: MRSA by PCR     Current Medications (10/24/2018):  This is the current hospital active medication list Current Facility-Administered Medications  Medication Dose Route Frequency Provider Last Rate Last Dose  . acetaminophen (TYLENOL) tablet 325-650 mg  325-650 mg Oral Q6H PRN Marchia Bond, MD      . alum & mag hydroxide-simeth (MAALOX/MYLANTA) 200-200-20 MG/5ML suspension 30 mL  30 mL Oral Q4H PRN Marchia Bond, MD      . bisacodyl (DULCOLAX) suppository 10 mg  10 mg Rectal Daily PRN Marchia Bond, MD      . busPIRone (BUSPAR) tablet 7.5 mg  7.5 mg Oral BID Cherene Altes, MD   7.5 mg at 10/24/18 0955  . cholecalciferol (VITAMIN D3) tablet 5,000 Units  5,000 Units Oral Q30 days Charlynne Cousins, MD   5,000 Units at 10/24/18 1014  . clonazepam (KLONOPIN) disintegrating tablet 0.125 mg  0.125 mg Oral BID Cherene Altes, MD   0.125 mg at 10/23/18 2353  . docusate sodium (COLACE) capsule 100 mg  100 mg Oral BID Marchia Bond, MD   100 mg at 10/24/18 0955  . enoxaparin (LOVENOX) injection 40 mg  40 mg Subcutaneous Q24H Marchia Bond, MD   40 mg at 10/24/18 0956  . escitalopram (LEXAPRO) tablet 10 mg  10 mg Oral q morning - 10a Cherene Altes, MD   10 mg at 10/24/18 0956  . feeding supplement (ENSURE ENLIVE) (ENSURE ENLIVE) liquid 237 mL  237 mL Oral BID BM Cherene Altes, MD   237 mL at 10/24/18 1013  . ferrous sulfate tablet 325 mg  325 mg Oral TID Emiliano Dyer, MD   325 mg at 10/24/18 1309  . HYDROcodone-acetaminophen (NORCO) 7.5-325 MG per tablet 1-2 tablet  1-2 tablet Oral Q4H PRN Marchia Bond, MD   2 tablet at 10/23/18 1850  . HYDROcodone-acetaminophen (NORCO/VICODIN) 5-325 MG per tablet 1-2 tablet  1-2 tablet Oral Q4H PRN Marchia Bond, MD   2 tablet at 10/23/18 2351  . hydrocortisone sodium succinate (SOLU-CORTEF) 100 MG injection 25 mg  25 mg Intravenous Q12H Charlynne Cousins, MD   25 mg at 10/24/18 0954  . insulin aspart (novoLOG) injection 0-5 Units  0-5 Units Subcutaneous QHS Joette Catching T, MD      . insulin aspart (novoLOG) injection 0-9 Units  0-9 Units Subcutaneous TID WC Cherene Altes, MD   1 Units at 10/24/18 1309  . ipratropium-albuterol (DUONEB) 0.5-2.5 (3) MG/3ML nebulizer solution 3 mL  3 mL Nebulization Q6H PRN Lisette Abu, PA-C      . lamoTRIgine (LAMICTAL) tablet 100 mg  100 mg Oral Daily Charlynne Cousins, MD   100 mg at 10/24/18 0955  . magnesium citrate solution 1 Bottle  1 Bottle Oral Once PRN Marchia Bond, MD      . menthol-cetylpyridinium (CEPACOL) lozenge 3 mg  1 lozenge Oral PRN Marchia Bond, MD       Or  . phenol (CHLORASEPTIC) mouth  spray 1 spray  1 spray Mouth/Throat PRN Marchia Bond, MD      . metoCLOPramide (REGLAN) tablet 5-10 mg  5-10 mg Oral Q8H PRN Marchia Bond, MD       Or  . metoCLOPramide (REGLAN) injection 5-10 mg  5-10 mg Intravenous Q8H PRN Marchia Bond, MD      . mirtazapine (REMERON) tablet 7.5 mg  7.5 mg Oral QHS Cherene Altes, MD   7.5 mg at 10/23/18 2354  . morphine 2 MG/ML injection 0.5-1 mg  0.5-1 mg Intravenous Q2H PRN Marchia Bond, MD   1 mg at 10/23/18 1656  . multivitamin with minerals tablet 1 tablet  1 tablet Oral Daily Cherene Altes, MD   1 tablet at 10/24/18 0955  . ondansetron (ZOFRAN) tablet 4 mg  4 mg Oral Q6H PRN Marchia Bond, MD       Or  . ondansetron [facility]) injection 4 mg  4 mg Intravenous Q6H PRN Marchia Bond, MD   4 mg at 10/23/18 1326  . pantoprazole (PROTONIX) EC tablet 40 mg  40 mg Oral Daily Marchia Bond, MD   40 mg at 10/24/18 0955  . polyethylene glycol  (MIRALAX / GLYCOLAX) packet 17 g  17 g Oral Daily PRN Marchia Bond, MD      . pramipexole (MIRAPEX) tablet 0.25 mg  0.25 mg Oral Daily Cherene Altes, MD   0.25 mg at 10/24/18 0955  . predniSONE (DELTASONE) tablet 5 mg  5 mg Oral Q breakfast Charlynne Cousins, MD   5 mg at 10/24/18 1014  . topiramate (TOPAMAX) tablet 25 mg  25 mg Oral QHS Cherene Altes, MD   25 mg at 10/23/18 2355  . vitamin B-12 (CYANOCOBALAMIN) tablet 500 mcg  500 mcg Oral Daily Cherene Altes, MD   500 mcg at 10/24/18 6283     Discharge Medications: Please see discharge summary for a list of discharge medications.  Relevant Imaging Results:  Relevant Lab Results:   Additional Information [id number]  Alberteen Sam, LCSW

## 2018-10-24 NOTE — Plan of Care (Signed)
Pt to discharge today back to SNF, pt will continue therapy and care at facility.

## 2018-10-24 NOTE — TOC Transition Note (Signed)
Transition of Care [facility]) - CM/SW Discharge Note   Patient Details  Name: ANELLA NAKATA MRN: [id number] Date of Birth: [birth year]  Transition of Care [facility]) CM/SW Contact:  Alberteen Sam, LCSW Phone Number: 10/24/2018, 3:40 PM   Clinical Narrative:     Patient will DC to: [facility] Anticipated DC date: 10/24/2018 Family notified:[facility] Transport EE:FEOF  Per MD patient ready for DC to [facility] . RN, patient, patient's family, and facility notified of DC. Discharge Summary sent to facility. RN given number for report [phone]. DC packet on chart. Ambulance transport requested for patient.  CSW signing off.  [place], [place]   Final next level of care: Skilled Nursing Facility Barriers to Discharge: No Barriers Identified   Patient Goals and CMS Choice Patient states their goals for this hospitalization and ongoing recovery are:: to go back to [facility] health care after she gets her hair washed CMS Medicare.gov Compare Post Acute Care list provided to:: Patient Choice offered to / list presented to : Patient  Discharge Placement PASRR number recieved: 10/24/18            Patient chooses bed at: [facility] Patient to be transferred to facility by: [place] Name of family member notified: Danae Chen Patient and family notified of of transfer: 10/24/18  Discharge Plan and Services Discharge Planning Services: NA Post Acute Care Choice: [place]          DME Arranged: N/A DME Agency: NA HH Arranged: NA HH Agency: NA   Social Determinants of Health (SDOH) Interventions     Readmission Risk Interventions No flowsheet data found.

## 2018-10-25 MED FILL — Vancomycin HCl-Dextrose IV Soln 1 GM/200ML-5%: INTRAVENOUS | Qty: 200 | Status: AC

## 2018-10-28 ENCOUNTER — Other Ambulatory Visit: Payer: Self-pay

## 2018-10-28 NOTE — Patient Outreach (Signed)
[facility]) Care Management  10/28/2018  Meghan [name] [birth year] [id number]   Medication Adherence call to Meghan [name] [place] patient is at a long term facility at this time. Meghan [name] is showing past due on Onglyza 5 mg under [place].   [place] Management Direct Dial [phone]  Fax [phone] [email or web address]

## 2018-10-31 ENCOUNTER — Ambulatory Visit: Payer: Medicare Other

## 2018-11-05 IMAGING — CT CT ABD-PELV W/ CM
2 of 5 series · 15 of 46 positions shown, 17 images · IV contrast (APPLIED)
Comparison: 06/10/2015

CLINICAL DATA: Three-week history of diarrhea. Left-sided abdominal
pain.

EXAM:
CT ABDOMEN AND PELVIS WITH CONTRAST
TECHNIQUE: Multidetector CT imaging of the abdomen and pelvis was performed
using the standard protocol following bolus administration of
intravenous contrast.
CONTRAST:  75mL [id number] IOPAMIDOL (Y18EA4-RXX) INJECTION 61%

[Series 2: routine abd/pel with · axial · 0.78mm/px · z∈[-475,-55]mm · 12 of 96 slices shown, 14 images]
[im 6/96  soft-tissue]
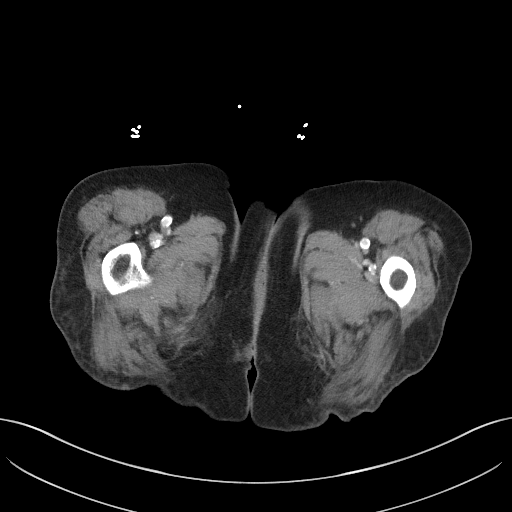
[im 6/96  bone]
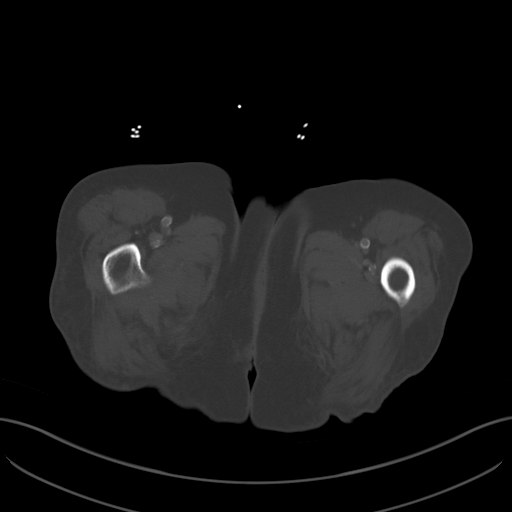
[im 16/96  soft-tissue]
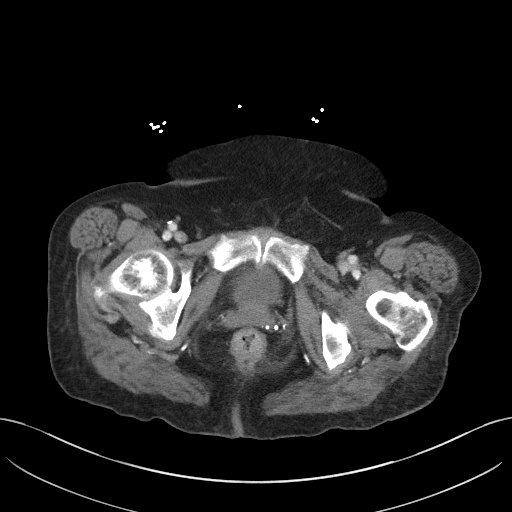
[im 22/96  soft-tissue]
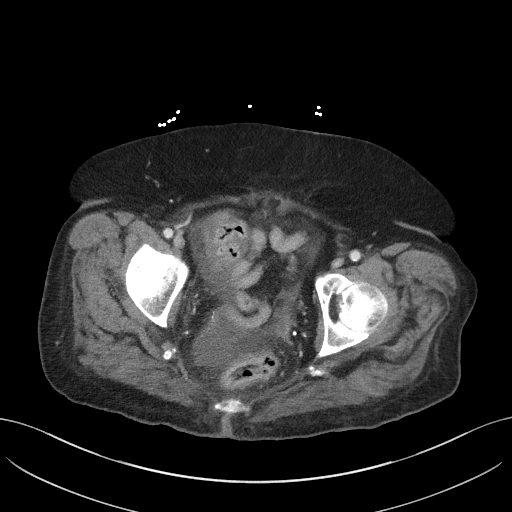
[im 27/96  soft-tissue]
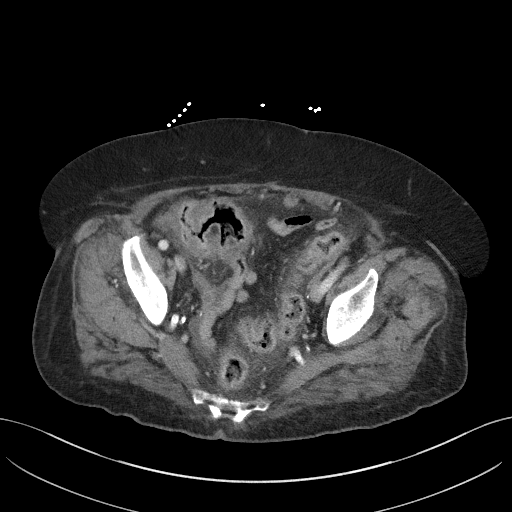
[im 37/96  soft-tissue]
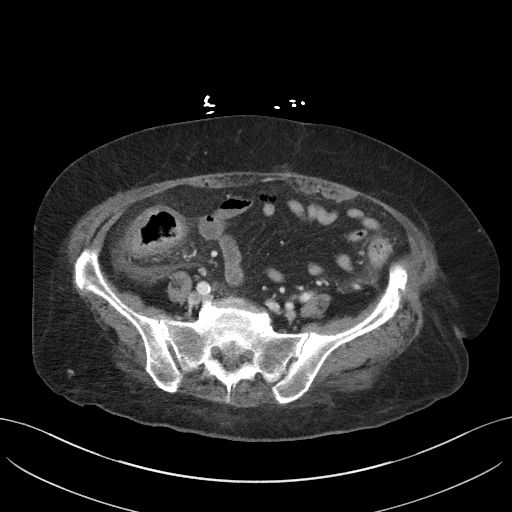
[im 43/96  soft-tissue]
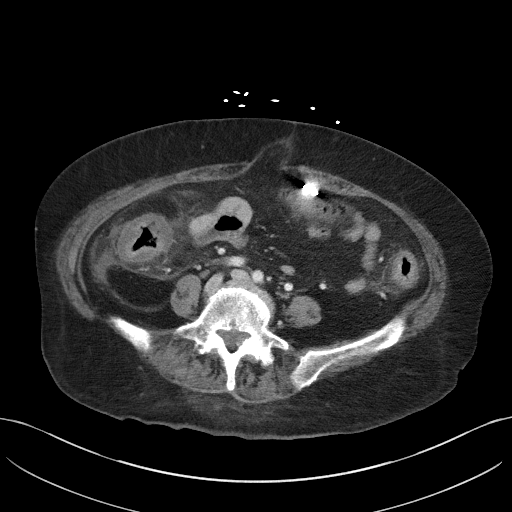
[im 53/96  soft-tissue]
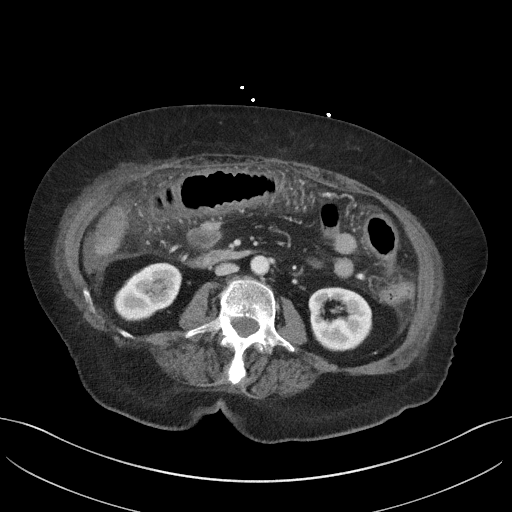
[im 59/96  soft-tissue]
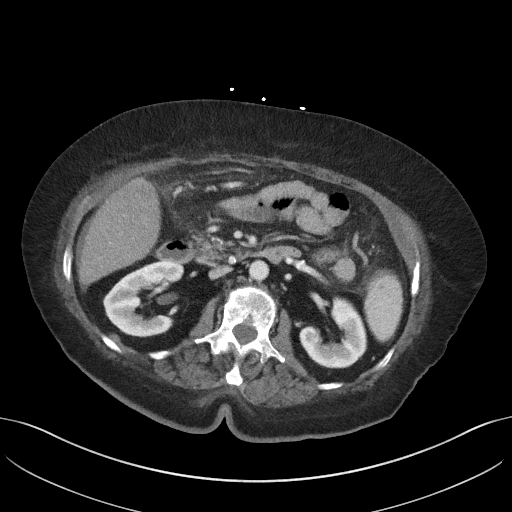
[im 69/96  soft-tissue]
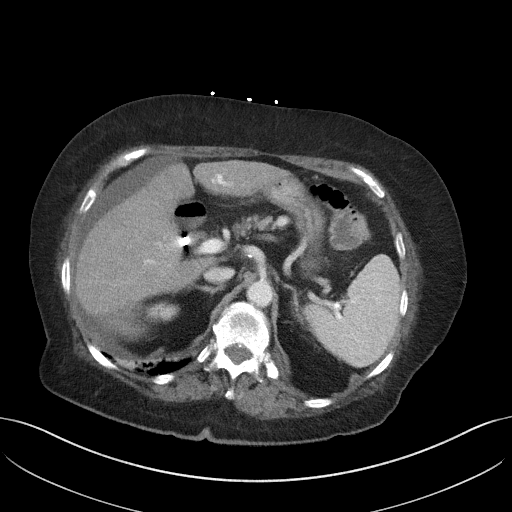
[im 69/96  bone]
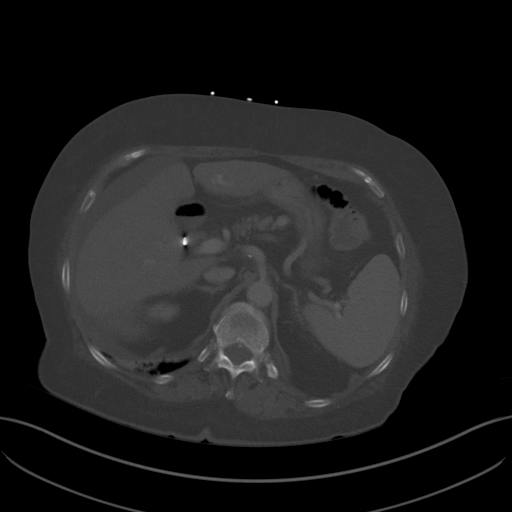
[im 74/96  soft-tissue]
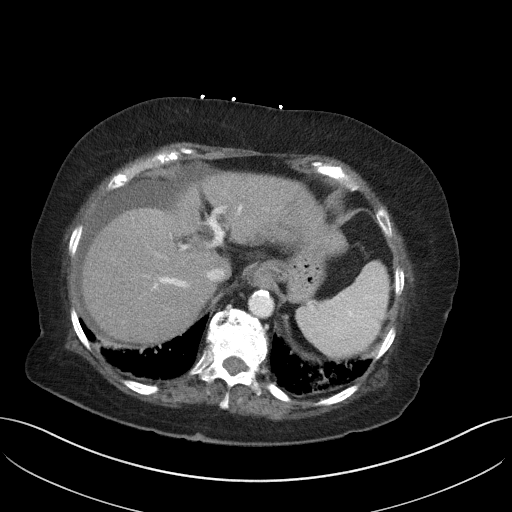
[im 80/96  soft-tissue]
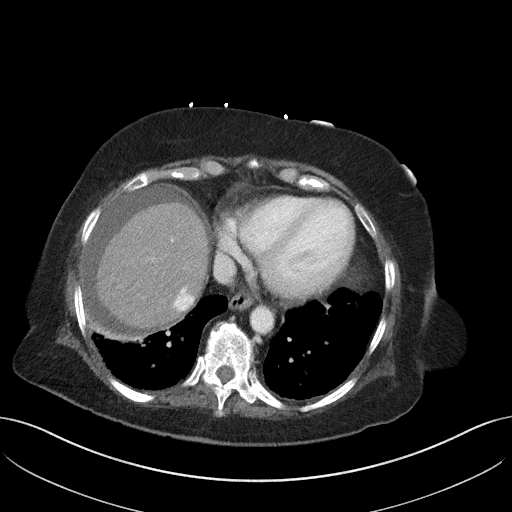
[im 90/96  soft-tissue]
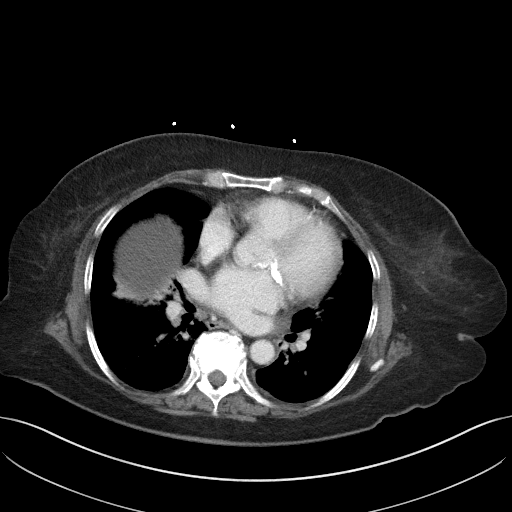

[Series 5: coronal st · coronal · 0.76mm/px · 3 of 83 slices shown]
[im 28/83  soft-tissue]
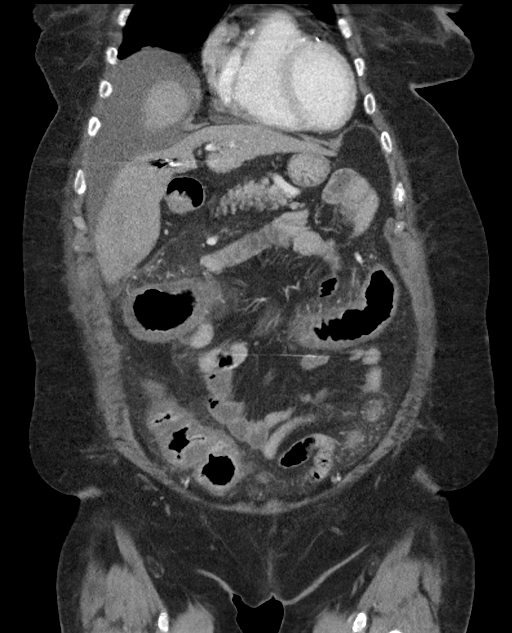
[im 37/83  soft-tissue]
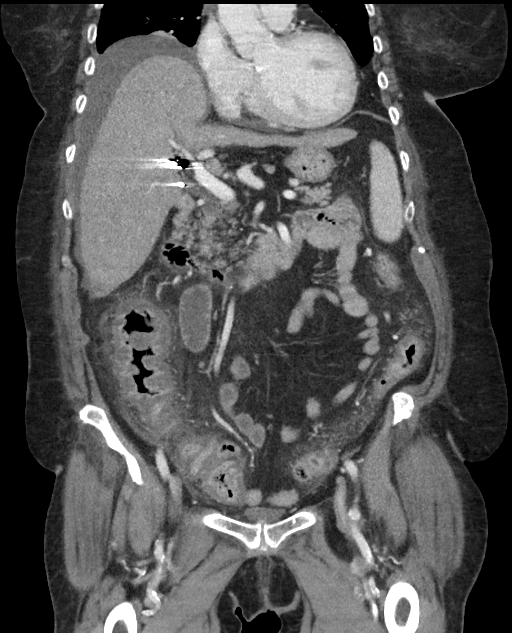
[im 46/83  soft-tissue]
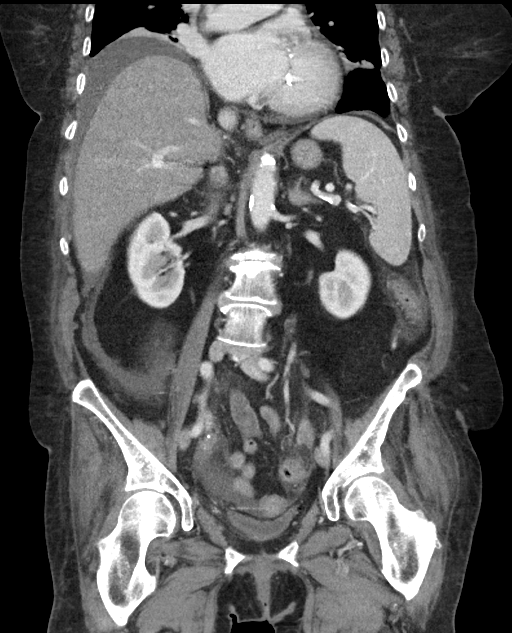

[15 of 46 positions shown; findings below may reference images not displayed]

FINDINGS: Lower chest: 17 mm cavitary lesion identified posterior left lower
lobe. Calcified granuloma identified in the posterior right lower
lobe.

Hepatobiliary: 19 mm hypervascular lesion in the dome of the liver
stable since chest CT of 11/19/2011. Irregular hypervascular lesion
in the lateral segment left liver with apparent portal and hepatic
venous communication suggesting vascular malformation. Gallbladder
surgically absent. No intrahepatic or extrahepatic biliary dilation.

Pancreas: Pancreas diffusely atrophic. No dilatation of the main
duct.

Spleen: No splenomegaly. No focal mass lesion. 11 mm saccular
aneurysm distal splenic artery.

Adrenals/Urinary Tract: No adrenal nodule or mass. Small renal cysts
noted right kidney. Left kidney unremarkable. No evidence for
hydroureter. The urinary bladder appears normal for the degree of
distention.

Stomach/Bowel: Stomach is nondistended. No gastric wall thickening.
No evidence of outlet obstruction. Duodenum is normally positioned
as is the ligament of Treitz. No small bowel wall thickening. No
small bowel dilatation. The terminal ileum is normal. The appendix
is not visualized, but there is no edema or inflammation in the
region of the cecum. Circumferential wall thickening is noted in the
colon, from the cecal tip to the distal rectum. There is substantial
pericolonic edema/ inflammation.

Vascular/Lymphatic: There is abdominal aortic atherosclerosis
without aneurysm. Portal vein and superior mesenteric vein are
patent. Celiac axis and SMA opacified normally. Normal opacification
IMA. There is no gastrohepatic or hepatoduodenal ligament
lymphadenopathy. No intraperitoneal or retroperitoneal
lymphadenopathy. No pelvic sidewall lymphadenopathy.

Reproductive: Uterus surgically absent.  There is no adnexal mass.

Other: Small volume free fluid is identified around the liver, in
both para colic gutters, and in the cul-de-sac.

Musculoskeletal: Bone windows reveal no worrisome lytic or sclerotic
osseous lesions.
IMPRESSION: 1. Diffuse colonic wall thickening with substantial pericolonic
edema/inflammation. Imaging features most suggestive of an
infectious/inflammatory colitis.
2. 17 mm cavitary lesion posterior left lower lobe. Neoplasm a
concern. Consider follow-up PET-CT after resolution of patient's
acute symptoms to further evaluate.
3. 19 mm hypervascular lesion in the dome of the liver unchanged
since 11/19/2011, consistent with benign etiology. Associated
vascular malformation lateral segment left liver.
4.  Aortic Atherosclerois (RJ7UH-170.0)

## 2018-11-07 IMAGING — CT CT ANGIO CHEST
1 of 9 series · 16 of 37 positions shown · IV contrast (APPLIED)
Comparison: 11/19/2011 chest CT.

CLINICAL DATA: Dyspnea.  Inpatient.  C. diff colitis.

EXAM:
CT ANGIOGRAPHY CHEST WITH CONTRAST
TECHNIQUE: Multidetector CT imaging of the chest was performed using the
standard protocol during bolus administration of intravenous
contrast. Multiplanar CT image reconstructions and MIPs were
obtained to evaluate the vascular anatomy.
CONTRAST:  75 cc Isovue 370 IV.

[Series 6: thins · axial · 0.64mm/px · z∈[-266,-41]mm · 16 of 257 slices shown]
[im 16/257  lung]
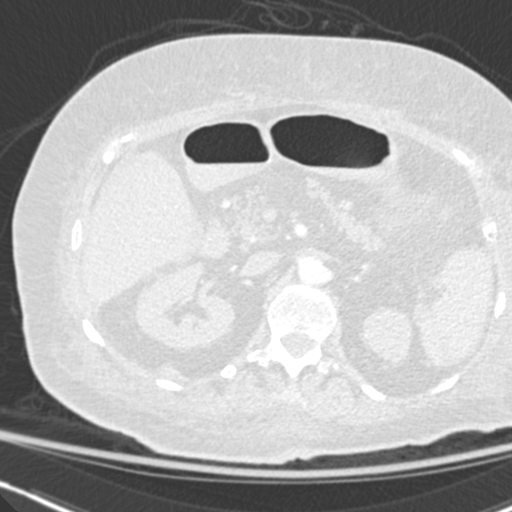
[im 31/257  mediastinal]
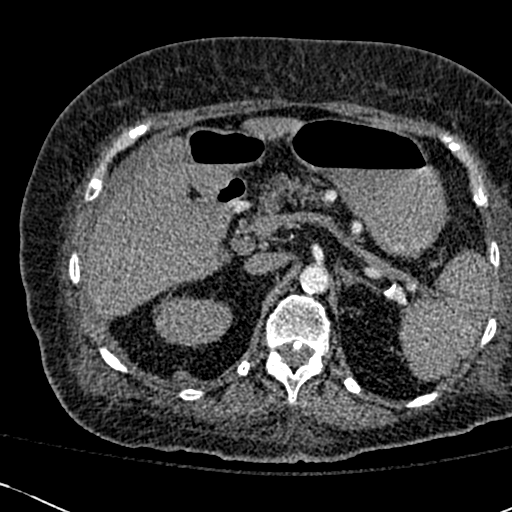
[im 46/257  lung]
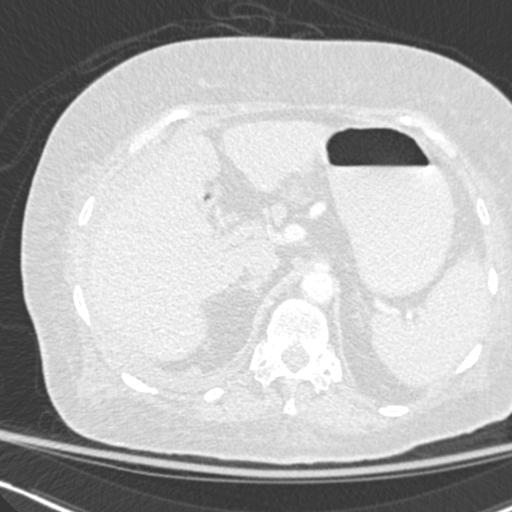
[im 61/257  mediastinal]
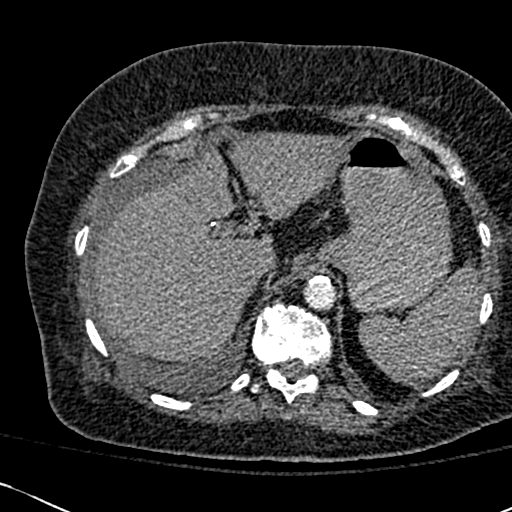
[im 76/257  lung]
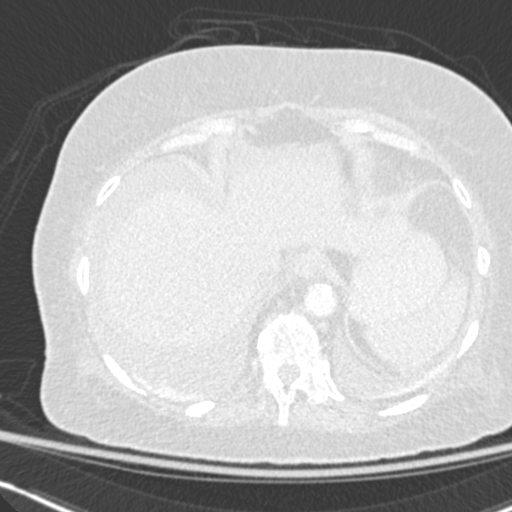
[im 91/257  mediastinal]
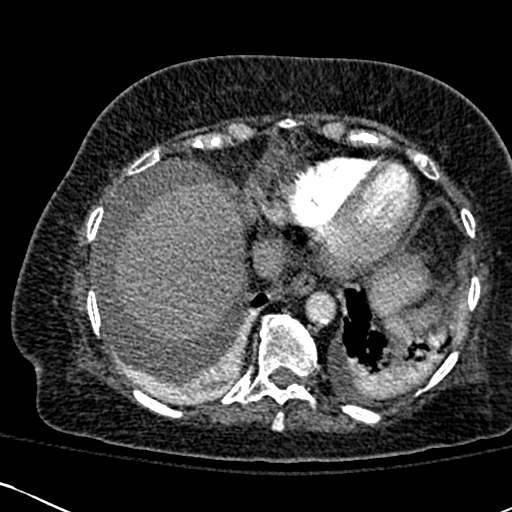
[im 106/257  lung]
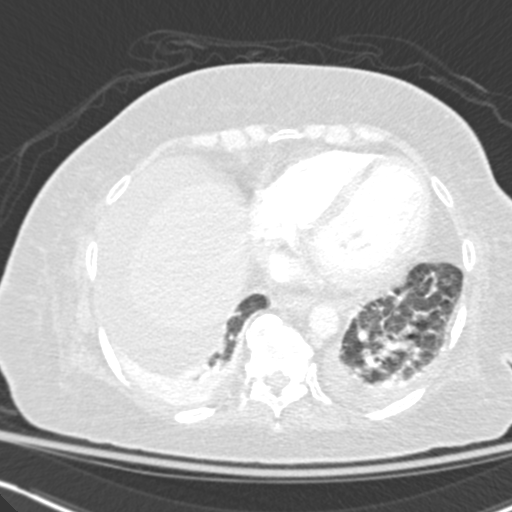
[im 121/257  mediastinal]
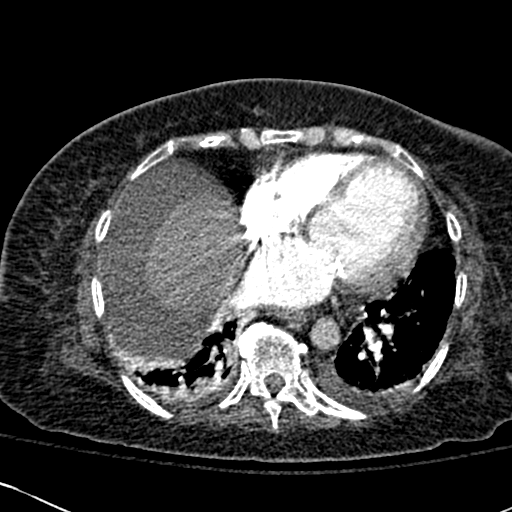
[im 136/257  lung]
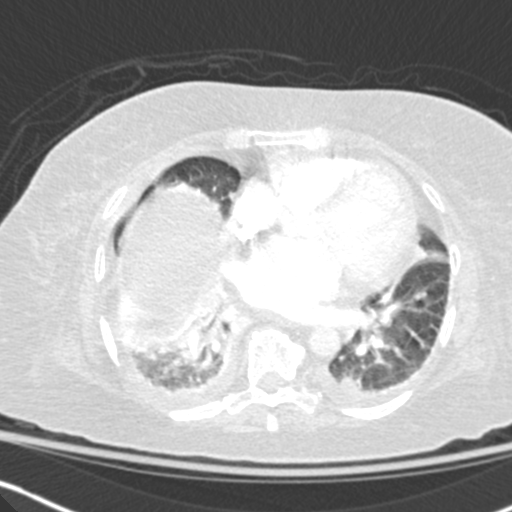
[im 151/257  mediastinal]
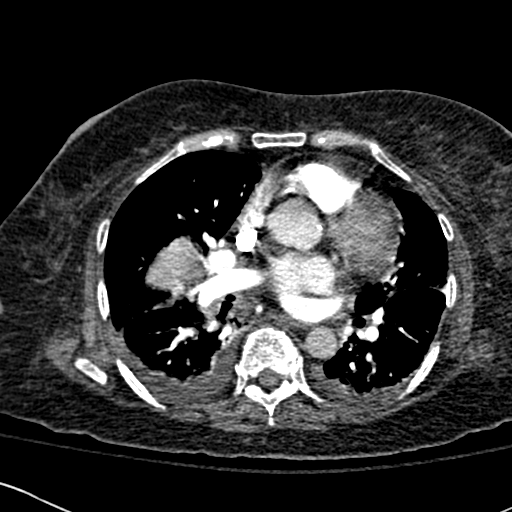
[im 166/257  lung]
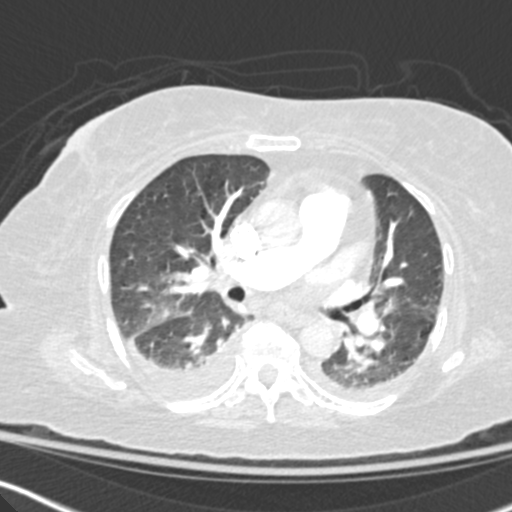
[im 181/257  mediastinal]
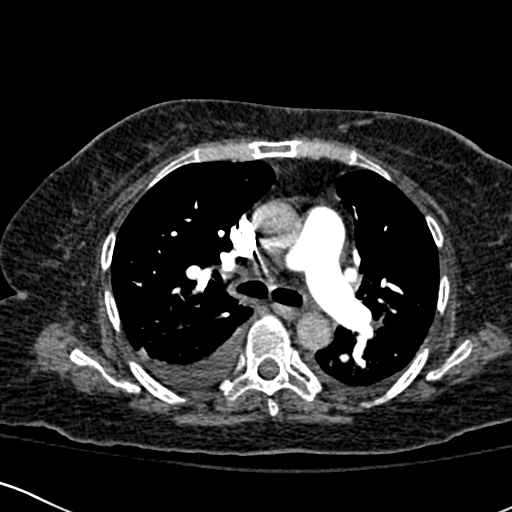
[im 196/257  lung]
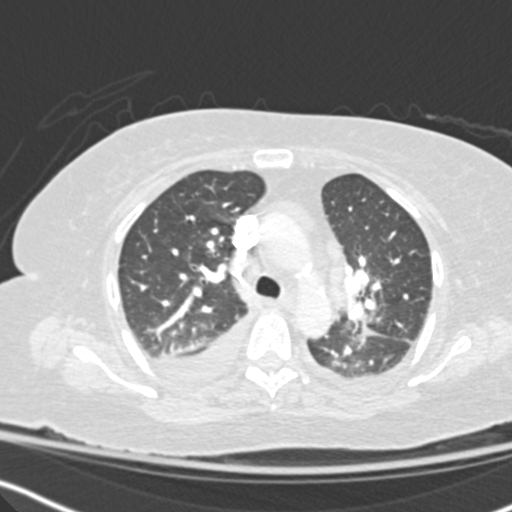
[im 211/257  mediastinal]
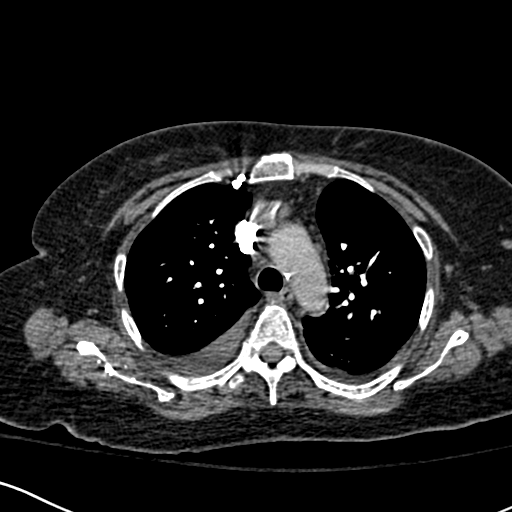
[im 226/257  lung]
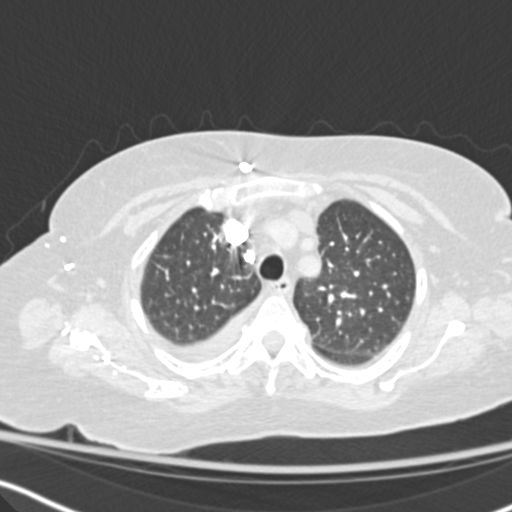
[im 241/257  mediastinal]
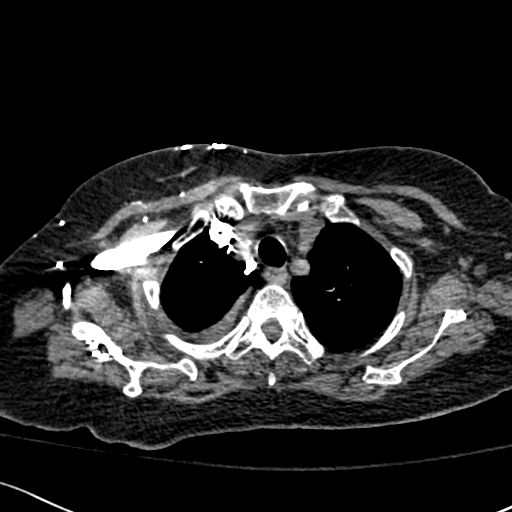

[16 of 37 positions shown; findings below may reference images not displayed]

FINDINGS: Cardiovascular: The study is low to moderate quality for the
evaluation of pulmonary embolism, with significantly limited
evaluation of the segmental and subsegmental pulmonary arteries
particularly at the lung bases due to motion artifact. There are no
filling defects in the central, lobar, segmental or subsegmental
pulmonary artery branches to suggest acute pulmonary embolism.
Atherosclerotic nonaneurysmal thoracic aorta. Normal caliber
pulmonary arteries. Normal heart size. Left anterior descending and
right coronary atherosclerosis.

Mediastinum/Nodes: Subcentimeter hypodense posterior left thyroid
lobe nodule. Unremarkable esophagus. No pathologically enlarged
axillary, mediastinal or hilar lymph nodes.

Lungs/Pleura: No pneumothorax. Small dependent bilateral pleural
effusions, right greater than left, new since 02/22/2017 CT abdomen
study. Tiny portions of the lung apices were inadvertently excluded
from the study. Solid 3 mm peripheral right upper lobe pulmonary
nodule (series 7/ image 29). Partially cavitary 1.5 x 0.7 cm
posterior left lower lobe pulmonary nodule (series 7/ image 37),
unchanged since 02/22/2017 CT abdomen study. Patchy ground-glass
attenuation in the dependent basilar right upper lobe. Compressive
atelectasis in the dependent right greater than left lower lobes.
Biapical mild pleuroparenchymal scarring.

Upper abdomen: Cholecystectomy. Small volume upper abdominal ascites
is unchanged since the recent CT abdomen study.

Musculoskeletal: No aggressive appearing focal osseous lesions.
Marked thoracic spondylosis. Mild anasarca.

Review of the MIP images confirms the above findings.
IMPRESSION: 1. No evidence of pulmonary embolism, with limitations as described.
2. New small dependent bilateral pleural effusions, right greater
than left.
3. Partially cavitary 1.5 cm posterior left lower lobe pulmonary
nodule, unchanged since the CT abdomen study from 2 days prior.
Primary lung malignancy not excluded. Management options include
short term follow-up outpatient chest CT in 2-3 months and/or PET-CT
evaluation.
4. Nonspecific patchy ground-glass opacity in the dependent basilar
right upper lobe, compatible with a nonspecific pneumonitis, with
the differential including mild aspiration.
5. Two-vessel coronary atherosclerosis.

Aortic Atherosclerosis ([id number]).

## 2018-11-08 ENCOUNTER — Non-Acute Institutional Stay: Payer: Medicare Other | Admitting: Nurse Practitioner

## 2018-11-08 ENCOUNTER — Encounter: Payer: Self-pay | Admitting: Nurse Practitioner

## 2018-11-08 VITALS — BP 116/66 | HR 72 | Temp 98.2°F | Resp 20 | Ht 60.0 in | Wt 152.5 lb

## 2018-11-08 DIAGNOSIS — Z515 Encounter for palliative care: Secondary | ICD-10-CM

## 2018-11-08 DIAGNOSIS — R0600 Dyspnea, unspecified: Secondary | ICD-10-CM | POA: Insufficient documentation

## 2018-11-08 DIAGNOSIS — R531 Weakness: Secondary | ICD-10-CM

## 2018-11-08 NOTE — Progress Notes (Signed)
[facility] Consult Note Telephone: [phone]  Fax: [phone]  [name] DOB: 02-17-1946 MRN: [id number]  PRIMARY CARE PROVIDER:   Donato Schultz, MD  REFERRING PROVIDER:  Dr [facility] RESPONSIBLE PARTY:   Self   RECOMMENDATIONS and PLAN:  1. Palliative care encounter Z51.5; Palliative medicine team will continue to support patient, patient's family, and medical team. Visit consisted of counseling and education dealing with the complex and emotionally intense issues of symptom management and palliative care in the setting of serious and potentially life-threatening illness  2. Dyspneic R06.00 secondary to congestive heart failure remain stable at present time. Continue daily weights and outpatient Cardiology appointments  3. Generalized weaknessR53.1  secondary to CHF continue with therapy as able. Encourage energy conservation and rest times.   ASSESSMENT:     I visited and observe Meghan [name]. We talked about purpose of palliative care visit and she was in agreement. We talked about past medical history in her recent current events. We talked about her fall and hospitalizations. We talked about symptoms of pain which Meghan [name] endorses that the oxycodone is helping. We talked about therapy. We talked about shortness of breath which Meghan [name] endorses is better but is a chronic problem. She talked about some days are better than others. We talked appetite slowly improving. We talked about residing in [place] with barriers. We talked about isolation in lockdown due to covid-19 in challenges she is facing. We talked about coping strategies. We talked about medical goals of care. Since she returned from hospitalization she does now remain a full code. She was a DNR prior to hospitalization and pacemaker placement. At present time Meghan [name] endorses that she wishes not to further visit code  status in today's palliative care visit, to discuss at next visit if she feels up to it. Assessment completed. She was quite productive and engaging. She talked about her roommate being gone. Therapeutic listening and emotional support provided. Chronic disease progression. Questions answered the satisfaction. We talked about next palliative care visit in 4 weeks if needed or sooner should she declined. Meghan [name] was in agreement. I have updated nursing staff.  I spent 45 minutes providing this consultation,  from 1:00pm to 1:45pm. More than 50% of the time in this consultation was spent coordinating communication.  HISTORY OF PRESENT ILLNESS:  Meghan [name] is a 73 y.o. year old female with multiple medical problems including COPD, congestive heart failure with ef 20%, asthma, collagen vascular disease, coronary artery disease, heart murmur, benign brain tumor, hypertension, gerd, hypercholesterolemia, history of headache, diabetes, gerd, arthritis, osteoporosis, lumbar degenerative disc disease, anxiety, PTSD, depression, kyphoplasty, right hand reconstruction 2011, cholecystectomy, cervical fusion, abdominal hysterectomy. Meghan. [name] continues to reside at skilled long-term care facility at [facility]. No significant weight loss though she does have difficulty with edema intermittently. She does take Oxycodone 5 / 325 mg Q 6 hours as needed for pain with requiring 5 mg / 24 hours. She is oriented and able to make her needs known. She recently had a roommate that she did not get along with although that roommate has now moved. She is a full code. She is followed by Psychiatry at the facility with last dataservice 3 / 10 / 2020 for Psychiatry supervisory visit  and no changes to plan of care. Last primary provider note 3 / 12 /2020 for repeated Falls and fractures. She did fall  on 3/11 / 2020 and had another fall prior to that 3/8 /2020 with x-ray of right side of which she landed on and it indicated a  femur fracture. She was referred Orthopedic for further evaluation and treatment 3 / 13 / 2020. 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 She did have a pacemaker placement on 3 / 13 / 2020. She also underwent an orif on 3/14 / 2020 distal closed fracture of distal end of femur. Adrenal renal insufficiency when she was started on steroids and transition to prednisone 5 mg twice a day to be weaned with discharge. COPD with stable. Rheumatoid arthritis she continued on her Methotrexate. Severe bipolar disorder with psychotic features note change in regiment and continue to follow with psychiatry. She was discharged back to [facility] and continue to be evaluated by physical therapy for Rehab. She does require assistance for transfers, adl's. She does for herself and appetite does remain on the poor side. She advocates for her own needs. At present Meghan [name] is lying in bed. She appears chronically ill but comfortable in verbalizes that. No visitors present. Palliative Care was asked to help address goals of care.   CODE STATUS: Full code  PPS: 50% HOSPICE ELIGIBILITY/DIAGNOSIS: TBD  PAST MEDICAL HISTORY:  Past Medical History:  Diagnosis Date   Anginal pain ([place])    Anxiety    Arthritis    RA   Asthma    Brain tumor (benign) (HCC)    CHF (congestive heart failure) (HCC)    Collagen vascular disease (HCC)    COPD (chronic obstructive pulmonary disease) (HCC)    Coronary artery disease    DDD (degenerative disc disease)    Depression    Diabetes mellitus    GERD (gastroesophageal reflux disease)    Headache    Heart murmur    Heart murmur    Hypercholesteremia    Hypertension    Lumbar degenerative disc disease    Migraines    Obesity    Osteopenia    Osteoporosis    Pneumonia    PTSD (post-traumatic stress disorder)       SOCIAL HX:  Social History   Tobacco Use   Smoking status: Never Smoker   Smokeless tobacco: Never Used  Substance Use Topics   Alcohol use: No    Alcohol/week: 0.0 standard drinks    ALLERGIES:  Allergies  Allergen Reactions   Amoxicillin Itching and Other (See Comments)    Has patient had a PCN reaction causing immediate rash, facial/tongue/throat swelling, SOB or lightheadedness with hypotension: Unknown Has patient had a PCN reaction causing severe rash involving mucus membranes or skin necrosis: Unknown Has patient had a PCN reaction that required hospitalization: Unknown Has patient had a PCN reaction occurring within the last 10 years: Unknown If all of the above answers are "NO", then may proceed with Cephalosporin use.    Gabapentin Other (See Comments)    Pt states that it causes her BP to drop.    Naproxen Hives   Nsaids Other (See Comments)    Reaction:  Unknown    Tolmetin Other (See Comments)    Reaction:  Unknown      PERTINENT MEDICATIONS:  Outpatient Encounter Medications as of 11/08/2018  Medication Sig   busPIRone (BUSPAR) 7.5 MG tablet Take 1 tablet by mouth 2 (two) times daily.  Cholecalciferol 5000 units TABS Take 5,000 Units by mouth every 30 (thirty) days.   clonazePAM (KLONOPIN) 0.5 MG tablet Take 0.125 mg by mouth 2 (two) times daily.   cyanocobalamin 500 MCG tablet Take 500 mcg by mouth daily.   diclofenac sodium (VOLTAREN) 1 % GEL Apply 2 g topically 3 (three) times daily. Apply to the fingers   diphenoxylate-atropine (LOMOTIL) 2.5-0.025 MG tablet Take 1 tablet by mouth every 6 (six) hours as needed for diarrhea or loose stools.    escitalopram (LEXAPRO) 10 MG tablet Take 1 tablet (10 mg total) by mouth every morning. (Patient taking differently: Take 20 mg by mouth every morning. )   fluticasone (VERAMYST) 27.5 MCG/SPRAY nasal spray Place 2 sprays into the nose 2 (two) times daily.   folic acid (FOLVITE) 1 MG tablet Take 1 mg by  mouth daily.   Ipratropium-Albuterol (COMBIVENT RESPIMAT) 20-100 MCG/ACT AERS respimat Inhale 2 puffs into the lungs every 6 (six) hours.   lamoTRIgine (LAMICTAL) 100 MG tablet Take 1 tablet (100 mg total) by mouth daily.   loperamide (IMODIUM A-D) 2 MG tablet Take 1 tablet (2 mg total) by mouth 4 (four) times daily as needed for diarrhea or loose stools. (Patient taking differently: Take 2 mg by mouth as needed for diarrhea or loose stools. )   loratadine (CLARITIN) 10 MG tablet Take 10 mg by mouth daily.   mirtazapine (REMERON) 7.5 MG tablet Take 7.5 mg by mouth at bedtime.    montelukast (SINGULAIR) 10 MG tablet Take 10 mg by mouth daily.   omeprazole (PRILOSEC) 20 MG capsule Take 20 mg by mouth daily.    oxyCODONE-acetaminophen (PERCOCET/ROXICET) 5-325 MG tablet Take 1 tablet by mouth every 4 (four) hours as needed for moderate pain or severe pain.   Potassium Chloride ER 20 MEQ TBCR Take 20 mEq by mouth 2 (two) times daily.   pramipexole (MIRAPEX) 0.25 MG tablet Take 0.25 mg by mouth daily.    predniSONE (DELTASONE) 5 MG tablet Take 1 tablet (5 mg total) by mouth 2 (two) times daily with a meal.   saxagliptin HCl (ONGLYZA) 5 MG TABS tablet Take 5 mg by mouth at bedtime.   Tofacitinib Citrate 5 MG TABS Take 5 mg by mouth 2 (two) times daily after a meal.    topiramate (TOPAMAX) 25 MG tablet Take 25 mg by mouth at bedtime.    No facility-administered encounter medications on file as of 11/08/2018.     PHYSICAL EXAM:   General: chronically ill, pleasant weak female Cardiovascular: regular rate and rhythm Pulmonary: clear ant fields Abdomen: soft, nontender, + bowel sounds GU: no suprapubic tenderness Extremities: +BLE edema, no joint deformities Skin: no rashes Neurological: Weakness but otherwise nonfocal  Kassie Keng Ihor Gully, NP

## 2018-11-09 ENCOUNTER — Other Ambulatory Visit: Payer: Self-pay

## 2018-11-09 IMAGING — DX DG CHEST 1V PORT
1 series · 1 of 1 positions shown · non-contrast
Comparison: Chest CT 2 days prior 02/24/2017

CLINICAL DATA: Shortness of breath after fall today after being
discharged from the hospital for colitis.

EXAM:
PORTABLE CHEST 1 VIEW

[chest ap]
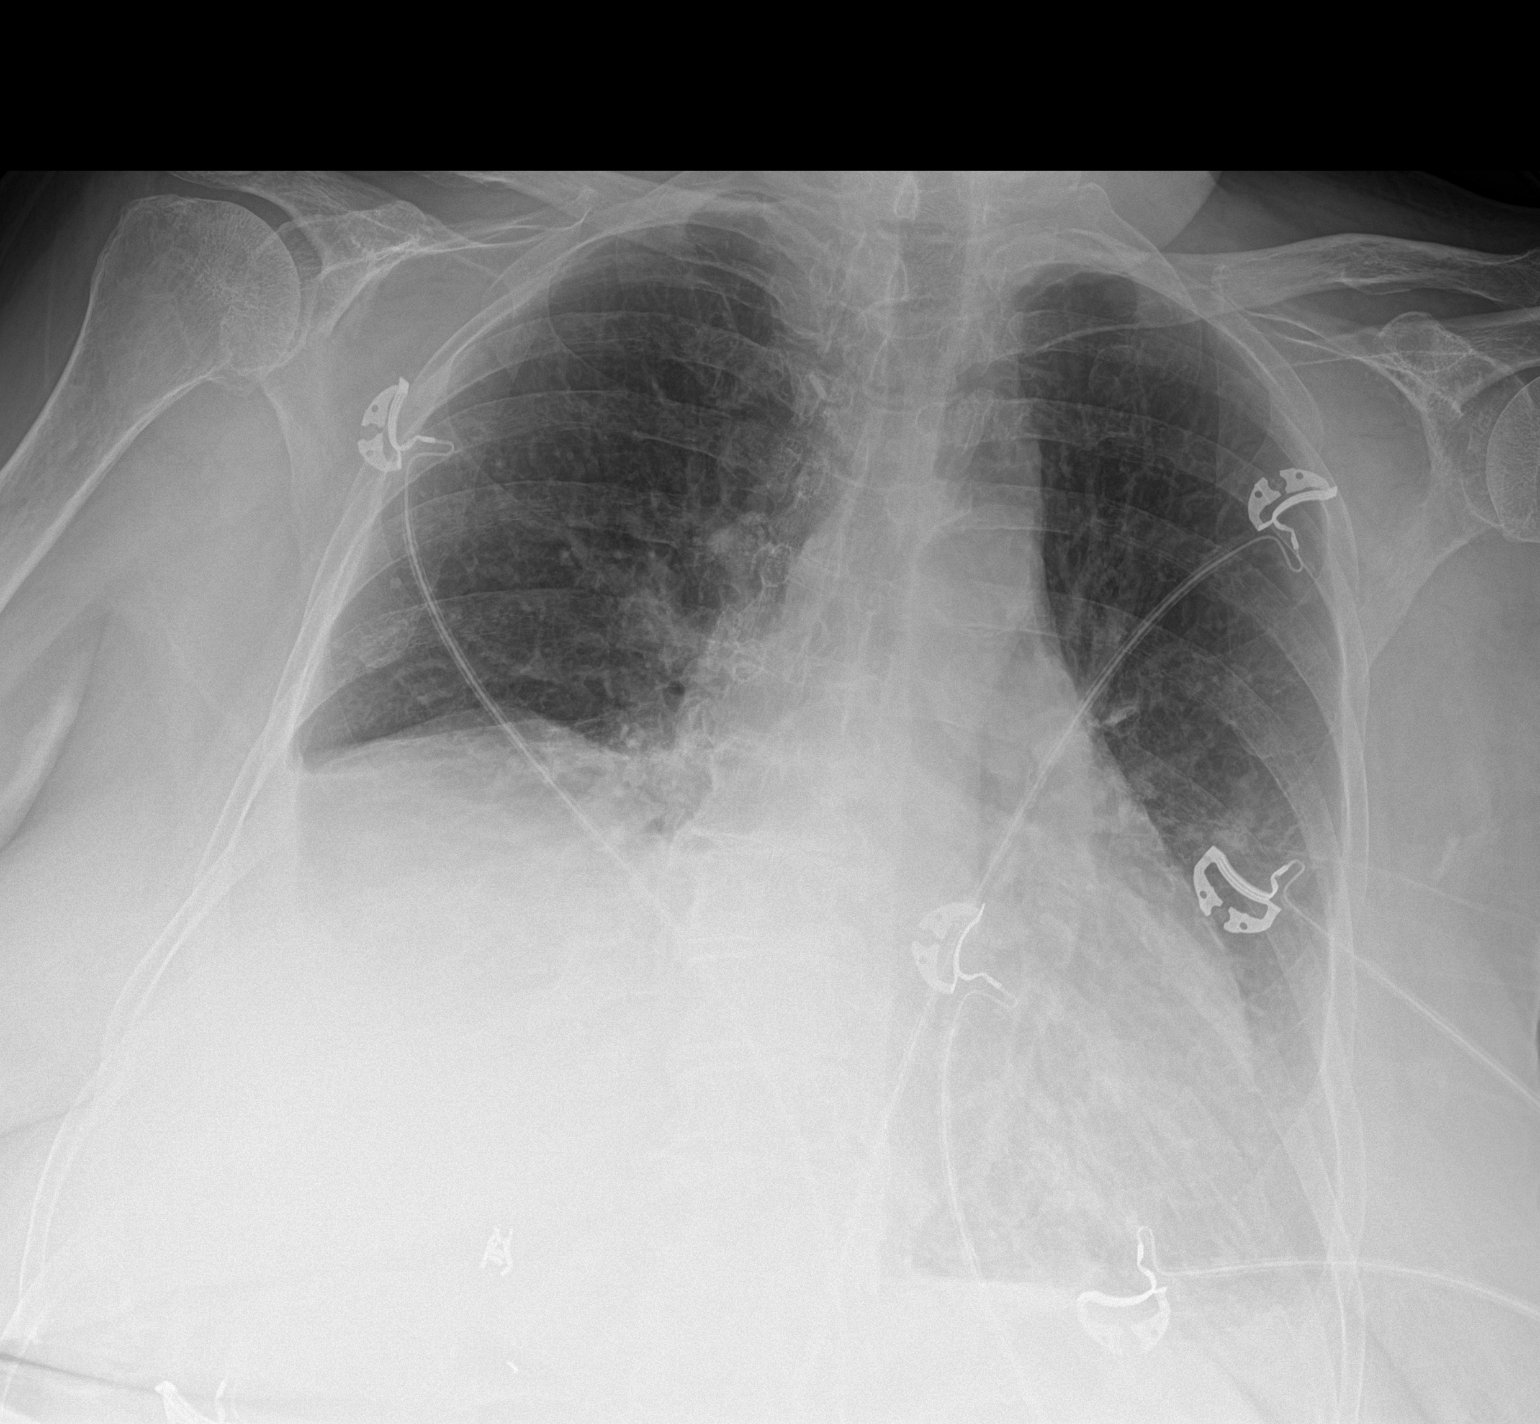

[1 of 1 positions shown; findings below may reference images not displayed]

FINDINGS: Again seen elevation of right hemidiaphragm. Small bilateral pleural
effusions appear grossly similar from CT. Unchanged heart size and
mediastinal contours. No pneumothorax or focal airspace disease. No
acute osseous abnormalities are seen.
IMPRESSION: Chronic elevation of right hemidiaphragm with small bilateral
pleural effusions, stable from recent CT. No new abnormality.

## 2018-11-13 IMAGING — CT CT HEAD W/O CM
3 series · 15 of 47 positions shown, 18 images · non-contrast
Comparison: Prior MRI from 11/01/2015.

CLINICAL DATA: Initial evaluation for acute mental status change.
History of Celsius diff colitis, COPD.

EXAM:
CT HEAD WITHOUT CONTRAST
TECHNIQUE: Contiguous axial images were obtained from the base of the skull
through the vertex without intravenous contrast.

[Series 2: head wo · axial · 0.39mm/px · z∈[+470,+595]mm · 9 of 31 slices shown, 12 images]
[im 3/31  brain]
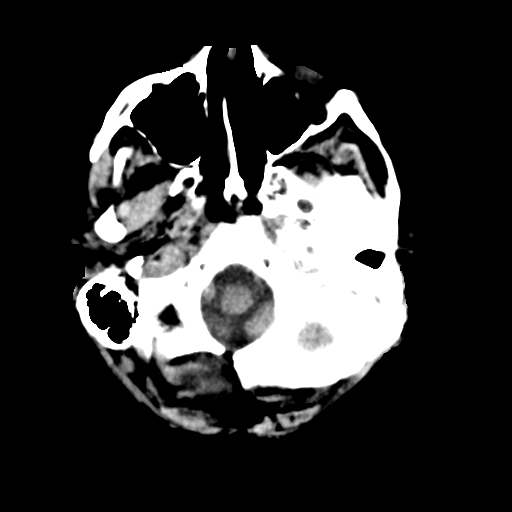
[im 3/31  bone]
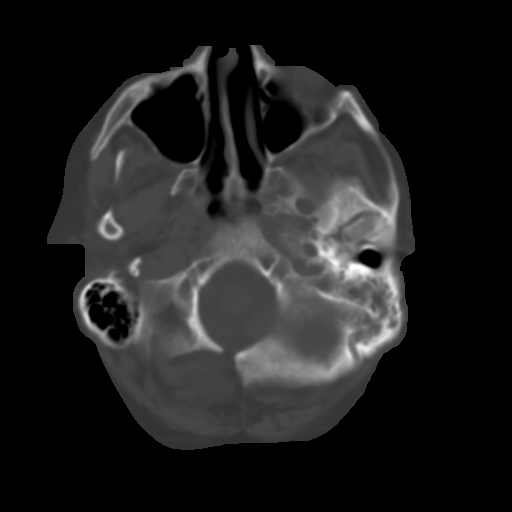
[im 6/31  brain]
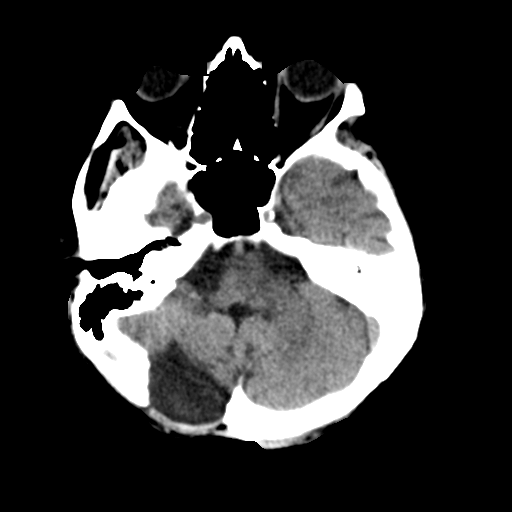
[im 9/31  brain]
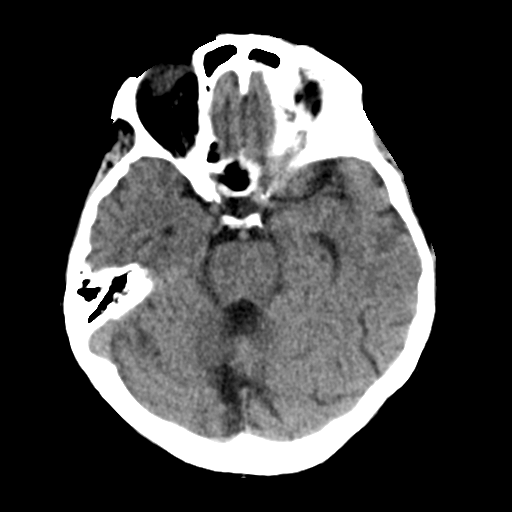
[im 12/31  brain]
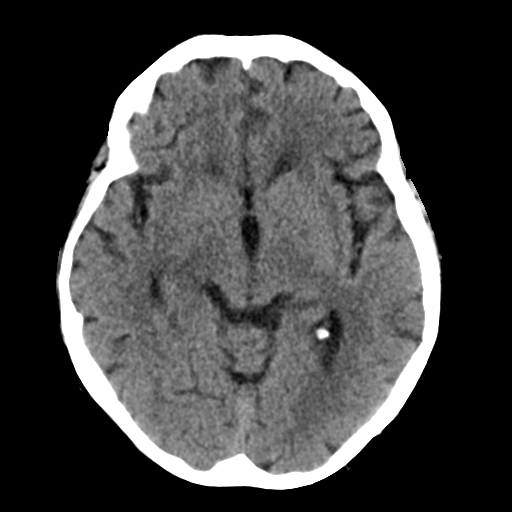
[im 16/31  brain]
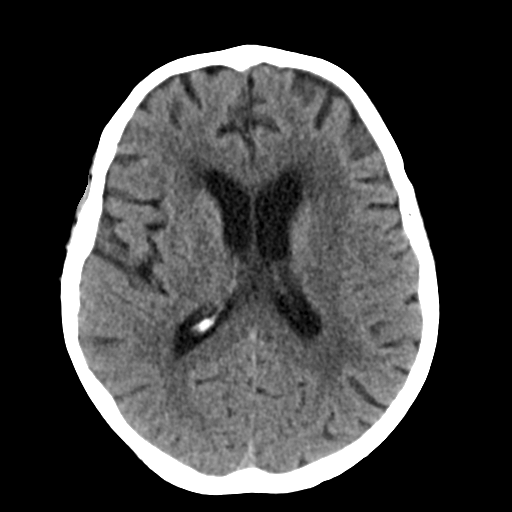
[im 16/31  bone]
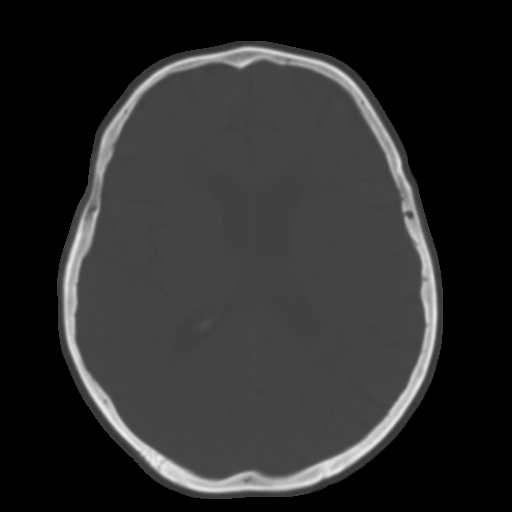
[im 19/31  brain]
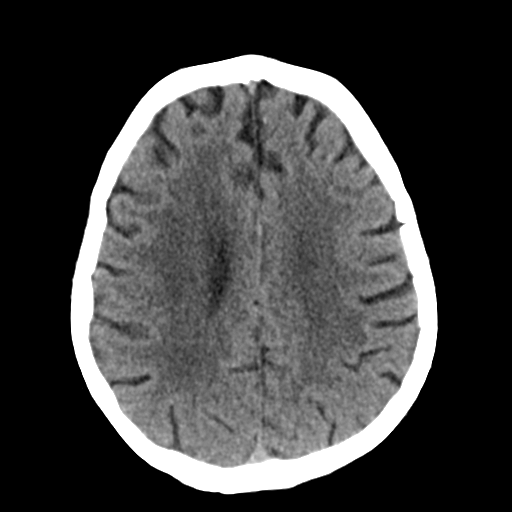
[im 22/31  brain]
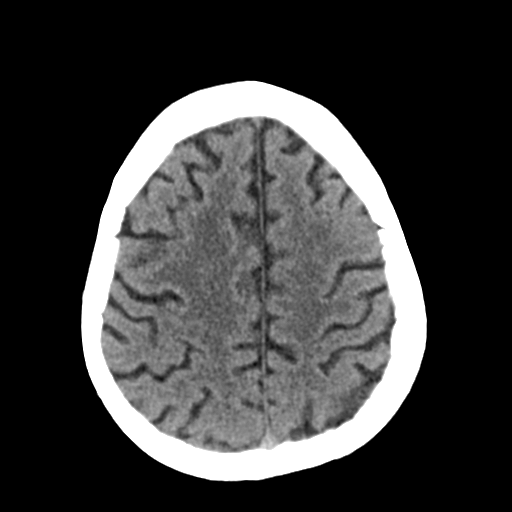
[im 25/31  brain]
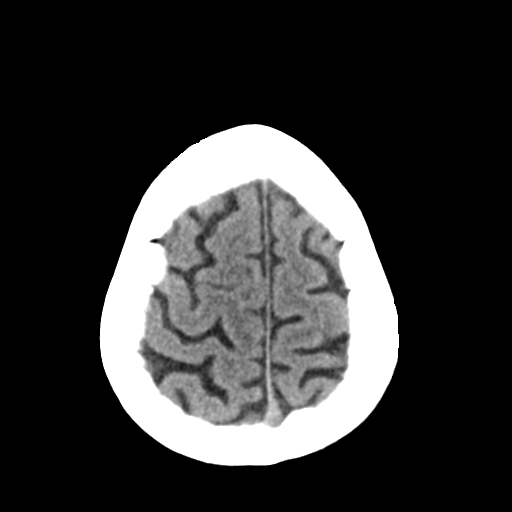
[im 28/31  brain]
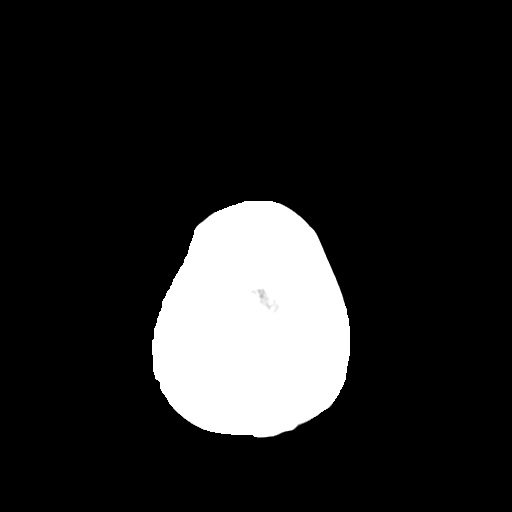
[im 28/31  bone]
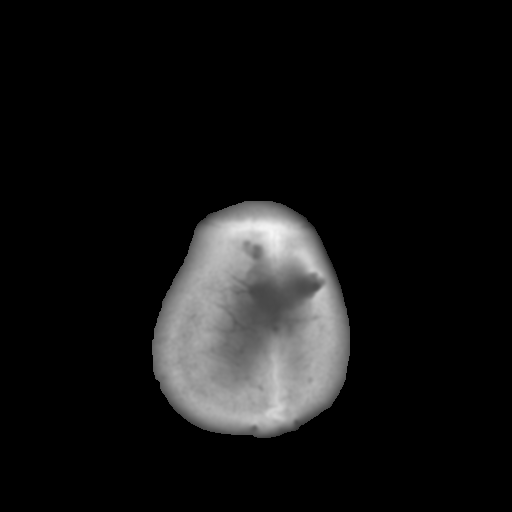

[Series 4: coronal soft tissue · coronal · 0.34mm/px · 3 of 66 slices shown]
[im 31/66  brain]
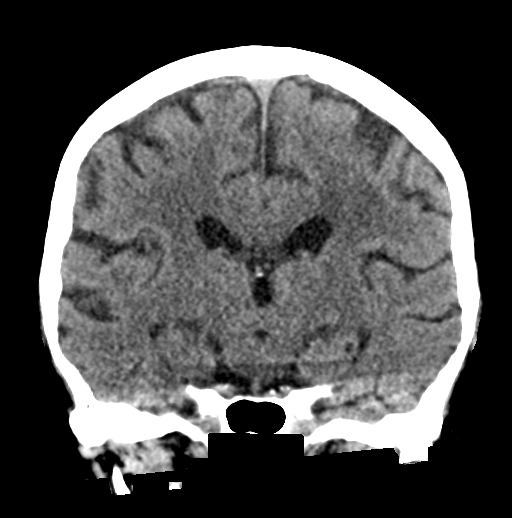
[im 36/66  brain]
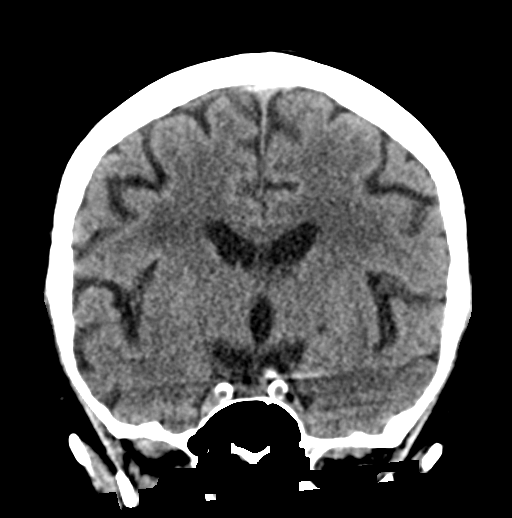
[im 42/66  brain]
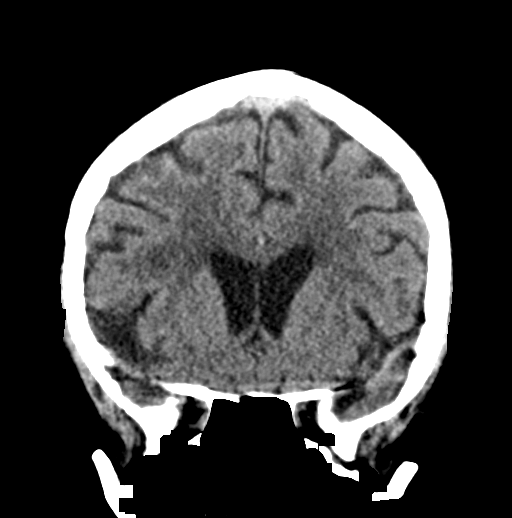

[Series 5: sagittal soft tissue · sagittal · 0.31mm/px · 3 of 60 slices shown]
[im 23/60  brain]
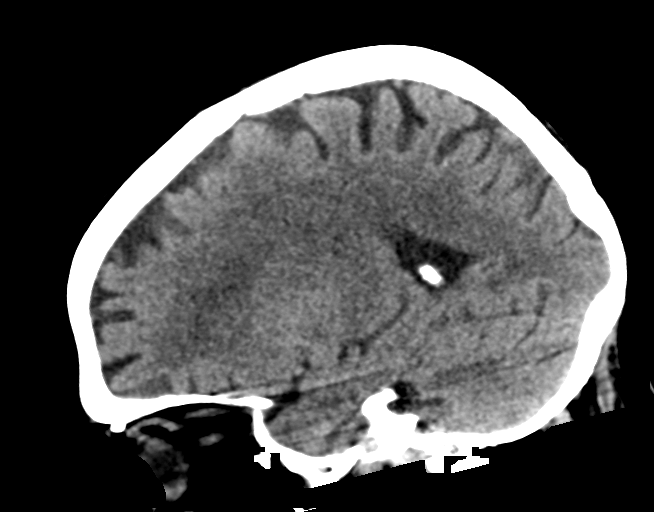
[im 31/60  brain]
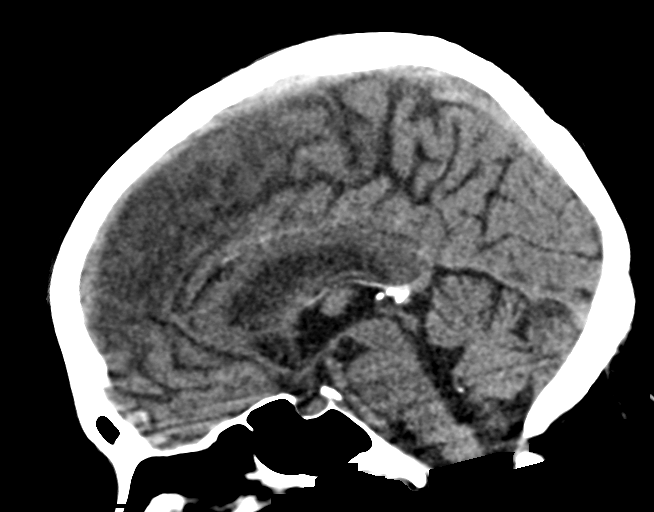
[im 39/60  brain]
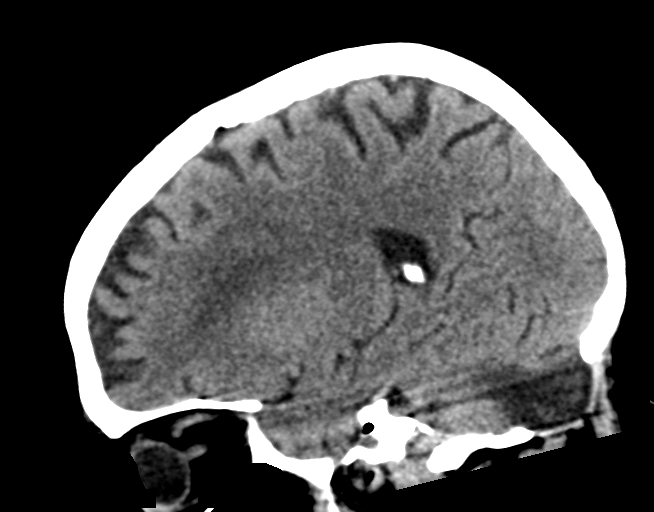

[15 of 47 positions shown; findings below may reference images not displayed]

FINDINGS: Brain: Generalized cerebral atrophy with moderate chronic
microvascular ischemic disease. Postoperative changes related to
previous right occipital craniectomy with underlying right
cerebellar encephalomalacia, stable from previous.

No acute intracranial hemorrhage. No evidence for acute large vessel
territory infarct. No mass lesion, midline shift or mass effect. No
hydrocephalus. No extra-axial fluid collection.

Vascular: No hyperdense vessel. Scattered vascular calcifications
noted within the carotid siphons.

Skull: Scalp soft tissues demonstrate no acute abnormality. Post
craniectomy changes again noted at the right suboccipital calvarium.
Calvarium otherwise intact.

Sinuses/Orbits: Globes and orbital soft tissues within normal
limits. Paranasal sinuses are clear. Right mastoid air cells clear.
Left mastoid effusion with partial opacification left middle ear
cavity.
IMPRESSION: 1. No acute intracranial process identified.
2. Stable atrophy with chronic small vessel ischemic disease.
3. Postoperative changes from previous right suboccipital
craniectomy with underlying right cerebellar encephalomalacia,
stable.
4. Left mastoid effusion with partial opacification left middle ear
cavity. Correlation with physical exam for possible otomastoiditis
recommended.

## 2018-12-01 IMAGING — DX DG ABDOMEN 1V
2 series · 2 of 2 positions shown · non-contrast
Comparison: CT abdomen and pelvis February 22, 2017

CLINICAL DATA: Assess orogastric tube placement.

EXAM:
ABDOMEN - 1 VIEW

[abdomen kub (1 of 2)]
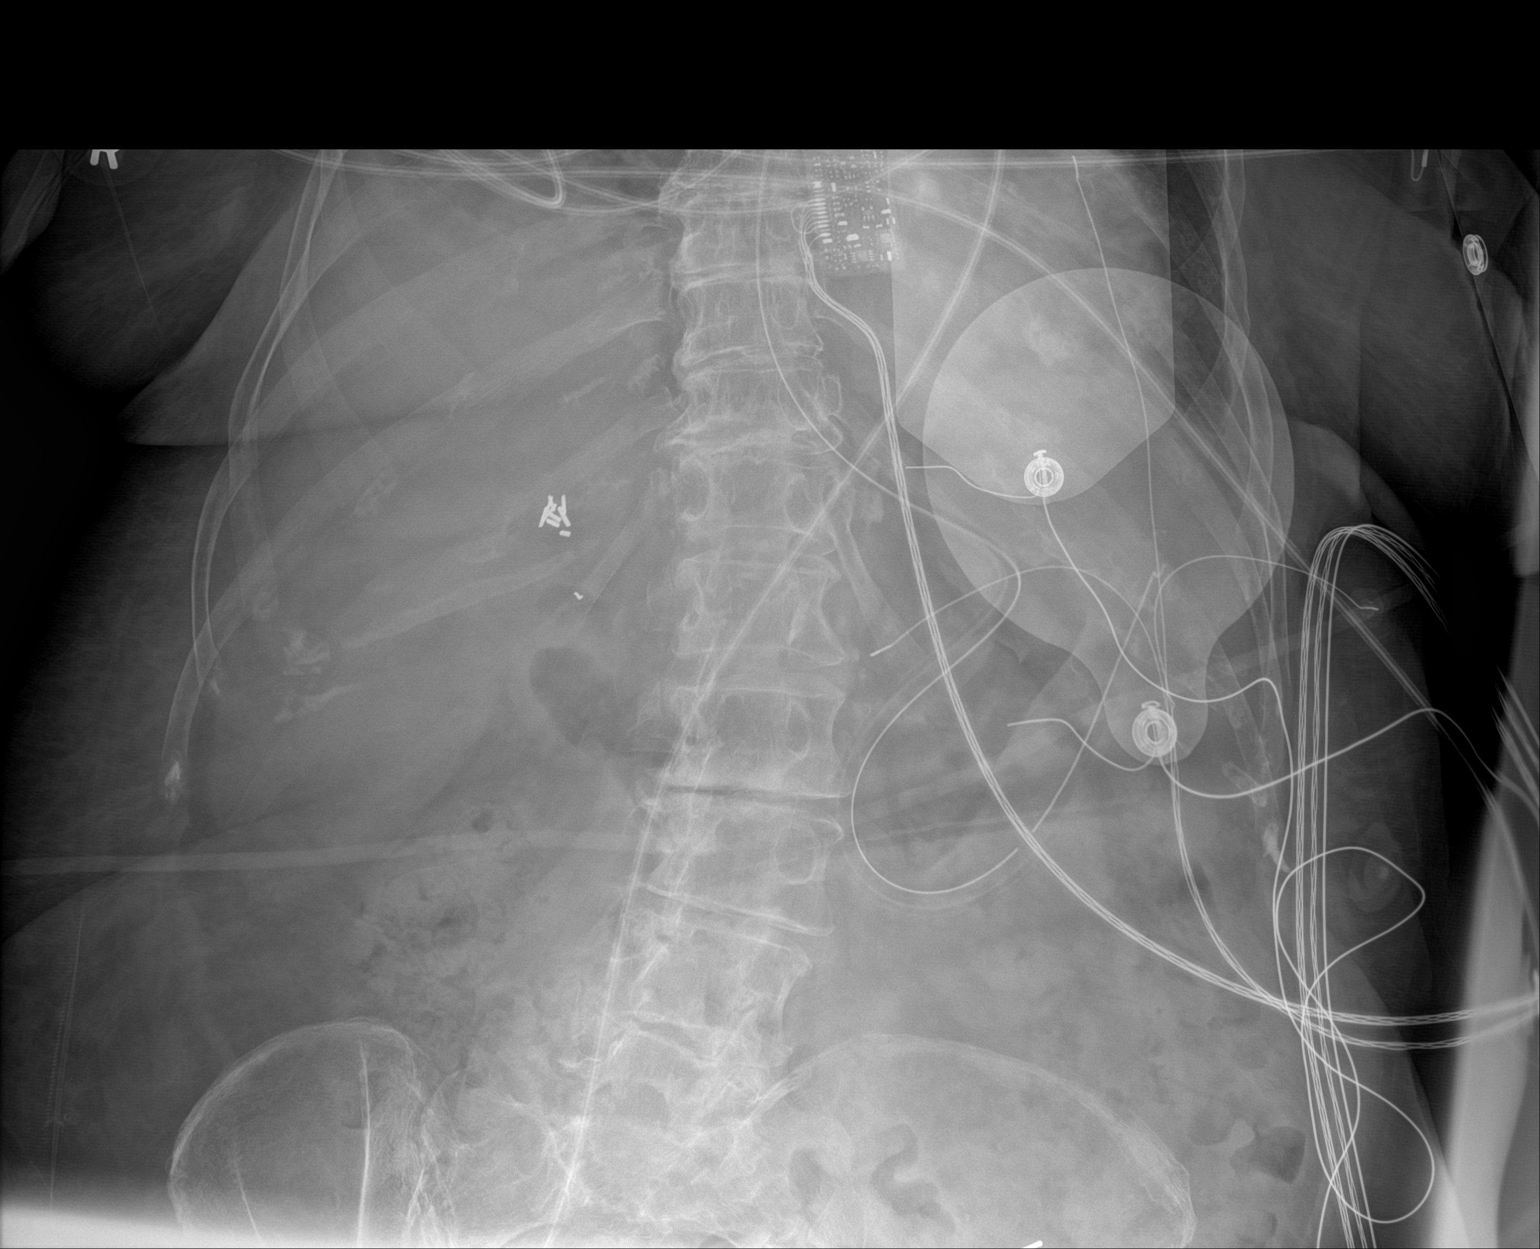

[abdomen kub (2 of 2)]
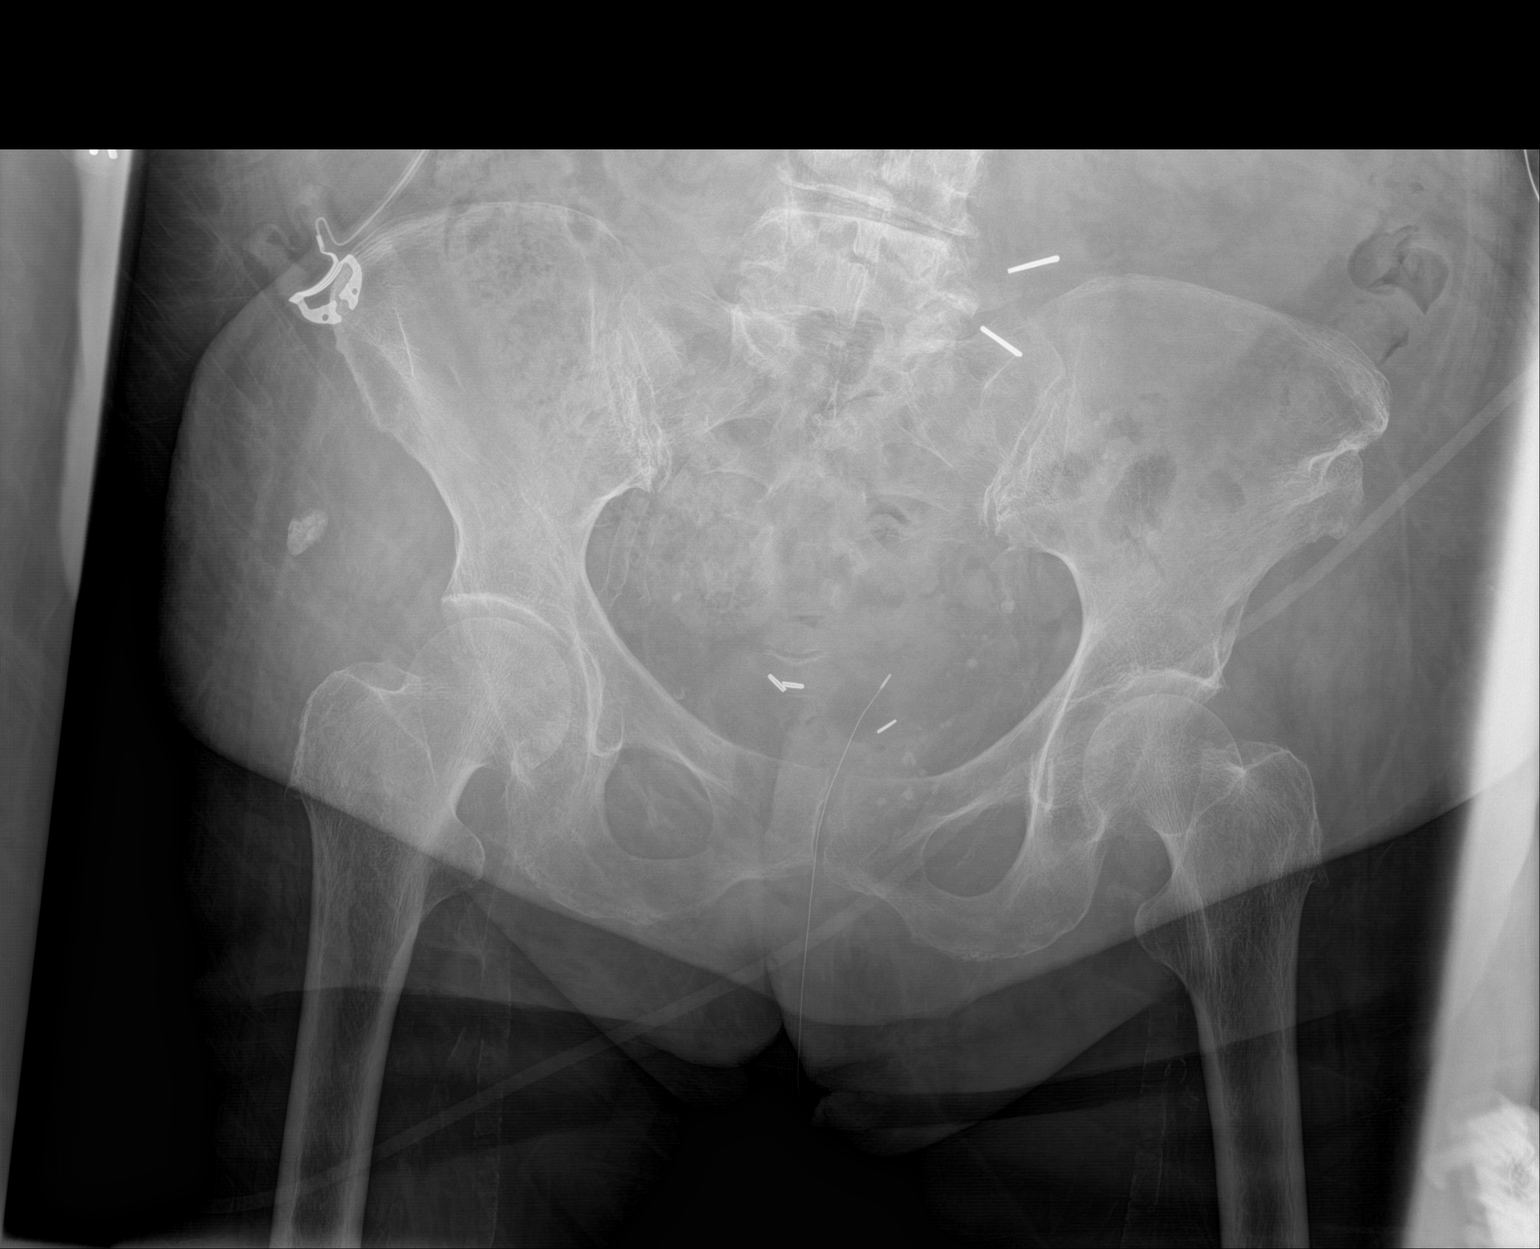

[2 of 2 positions shown; findings below may reference images not displayed]

FINDINGS: Nasogastric tube looped in mid stomach. Tip projects back in
proximal to mid stomach. Paucity of bowel gas with moderate amount
of retained large bowel stool. Surgical clips in the included right
abdomen compatible with cholecystectomy. Soft tissue planes and
included osseous structure nonsuspicious. Multiple EKG lines and
pacer pads overlie the patient and may obscure subtle underlying
pathology.
IMPRESSION: Nasogastric tube tip projects in proximal to mid stomach.

## 2018-12-01 IMAGING — DX DG CHEST 1V PORT
1 series · 1 of 1 positions shown · non-contrast
Comparison: Chest radiograph performed 02/26/2017

CLINICAL DATA: Patient found unresponsive. Rhonchi. Initial
encounter.

EXAM:
PORTABLE CHEST 1 VIEW

[chest ap]
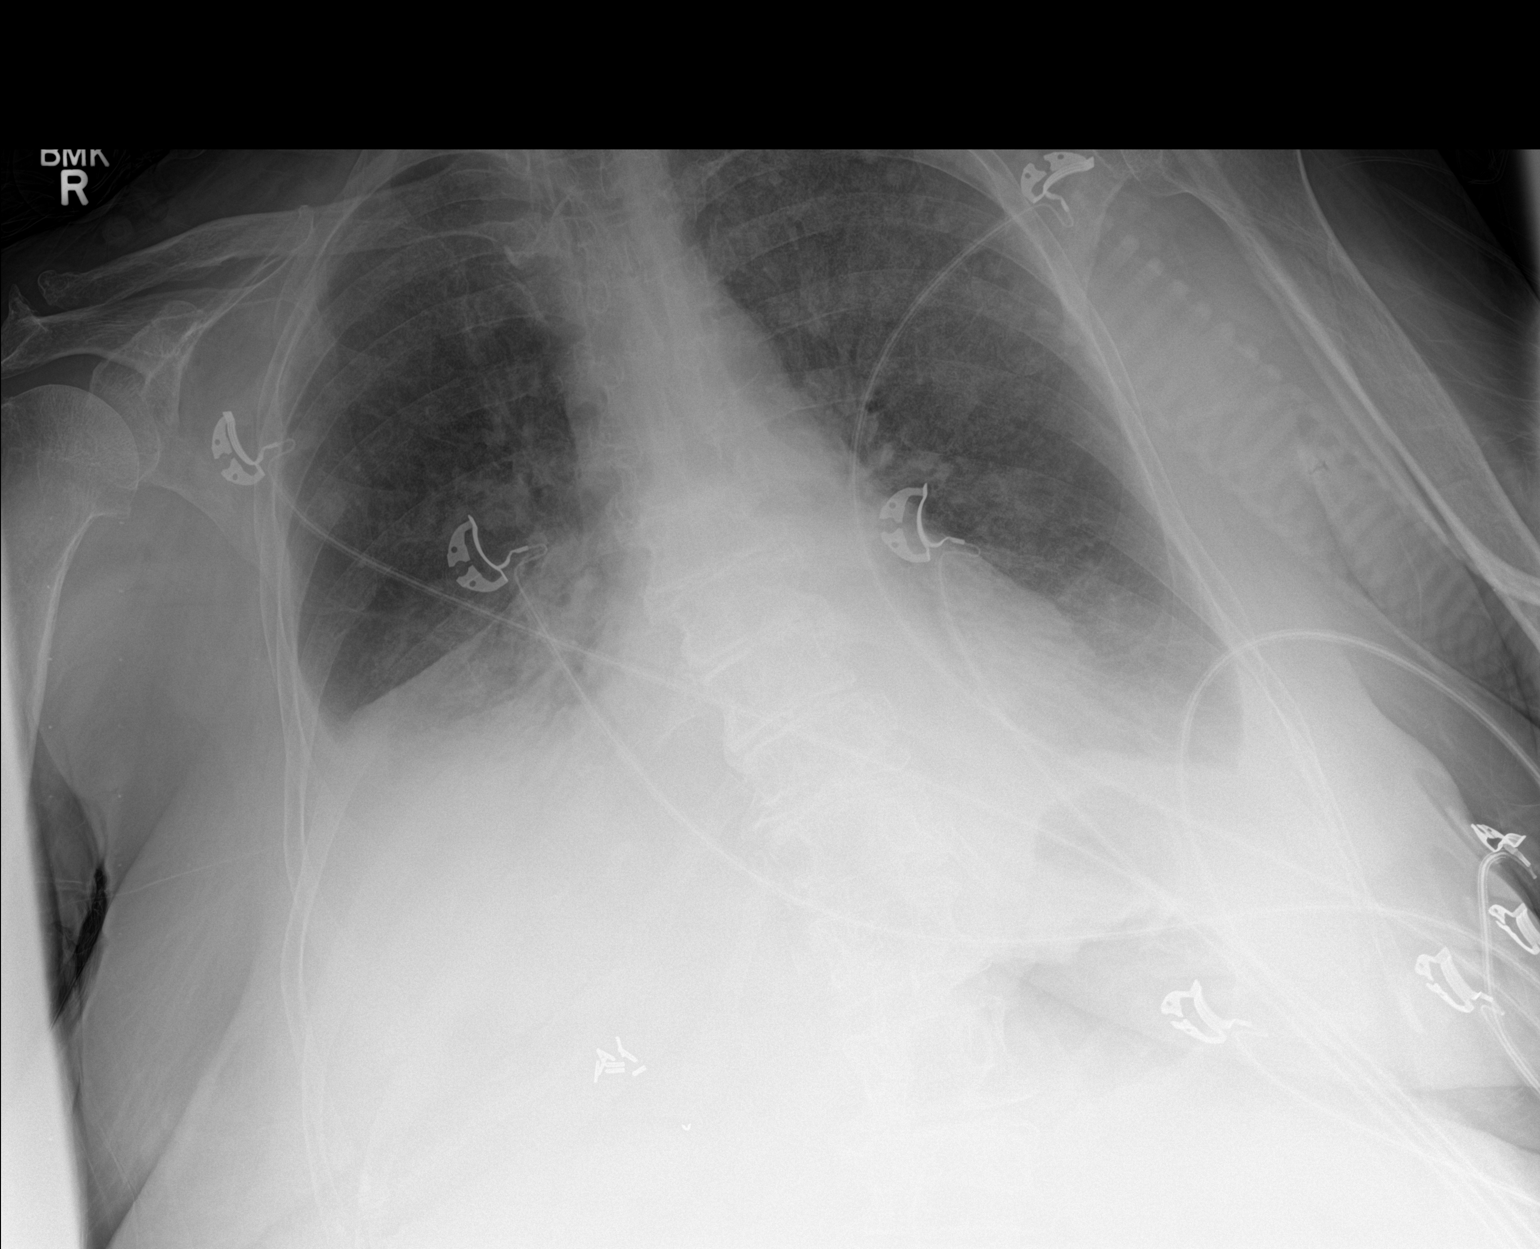

[1 of 1 positions shown; findings below may reference images not displayed]

FINDINGS: There is mild elevation of the right hemidiaphragm. Small bilateral
pleural effusions are noted. Bibasilar airspace opacities may
reflect atelectasis or pneumonia. Haziness within the left lung
raises concern for mild superimposed interstitial edema. No
pneumothorax is seen.

The cardiomediastinal silhouette is mildly enlarged. No acute
osseous abnormalities are seen.
IMPRESSION: Mild elevation of the right hemidiaphragm. Small bilateral pleural
effusions. Bibasilar airspace opacities may reflect atelectasis or
pneumonia. Haziness within the left lung raises concern for mild
superimposed interstitial edema. Mild cardiomegaly.

## 2018-12-01 IMAGING — DX DG CHEST 1V PORT
1 series · 1 of 1 positions shown · non-contrast
Comparison: Chest radiograph performed earlier today at [DATE] a.m.

CLINICAL DATA: Central line placement.  Initial encounter.

EXAM:
PORTABLE CHEST 1 VIEW

[chest ap]
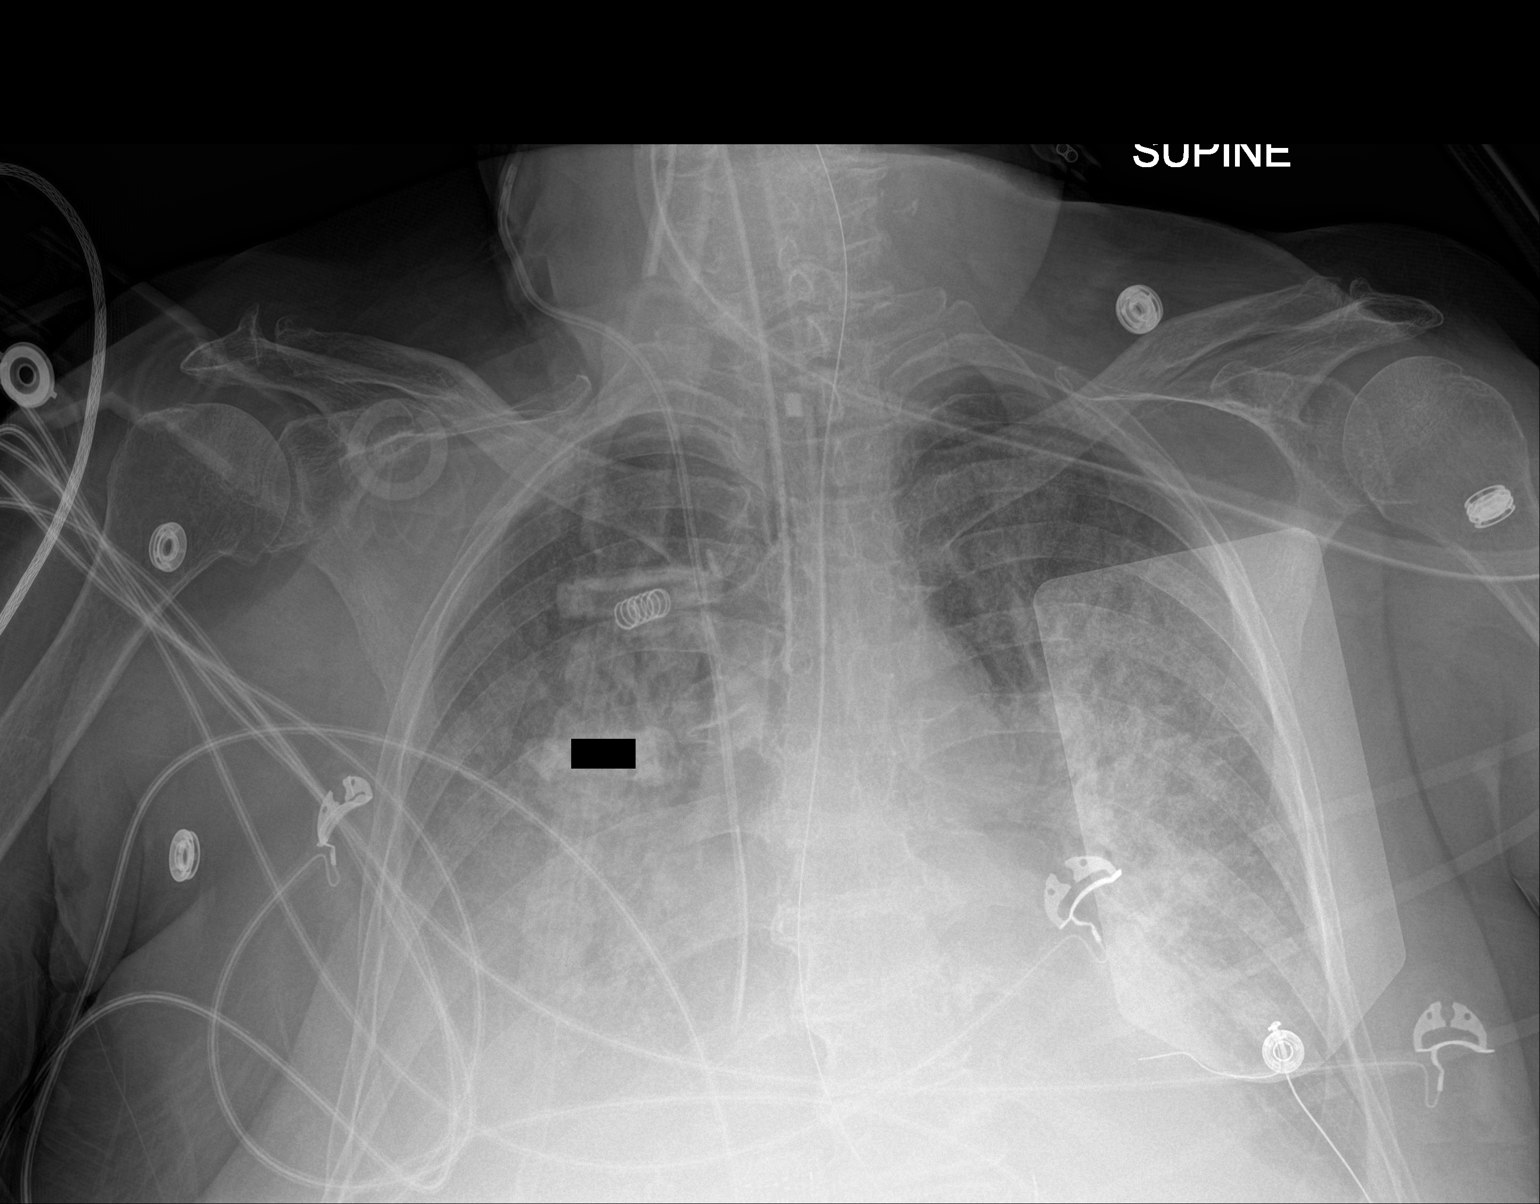

[1 of 1 positions shown; findings below may reference images not displayed]

FINDINGS: The patient's endotracheal tube is seen ending 1-2 cm above the
carina. This could be retracted 1-2 cm.

The right IJ line is noted ending about the proximal right atrium.
This could be retracted 3 cm, as deemed clinically appropriate.

The enteric tube is noted extending below the diaphragm.

Vascular congestion is again noted, with worsening increased
interstitial markings, concerning for worsening pulmonary edema.
Small bilateral pleural effusions are noted. No pneumothorax is
seen.

The cardiomediastinal silhouette is mildly enlarged. No acute
osseous abnormalities are identified. An external pacing pad is
noted.
IMPRESSION: 1. Endotracheal tube seen ending 1-2 cm above the carina. This could
be retracted 1-2 cm.
2. Right IJ line noted ending about the proximal right atrium. This
could be retracted 3 cm, as deemed clinically appropriate.
3. Vascular congestion and mild cardiomegaly. Worsening increased
interstitial markings are concerning for worsening pulmonary edema.
Small bilateral pleural effusions noted.

## 2018-12-01 IMAGING — DX DG CHEST 1V PORT
1 series · 1 of 1 positions shown · non-contrast
Comparison: Chest radiograph performed earlier today at [DATE] a.m.

CLINICAL DATA: Endotracheal tube placement.  Initial encounter.

EXAM:
PORTABLE CHEST 1 VIEW

[chest ap]
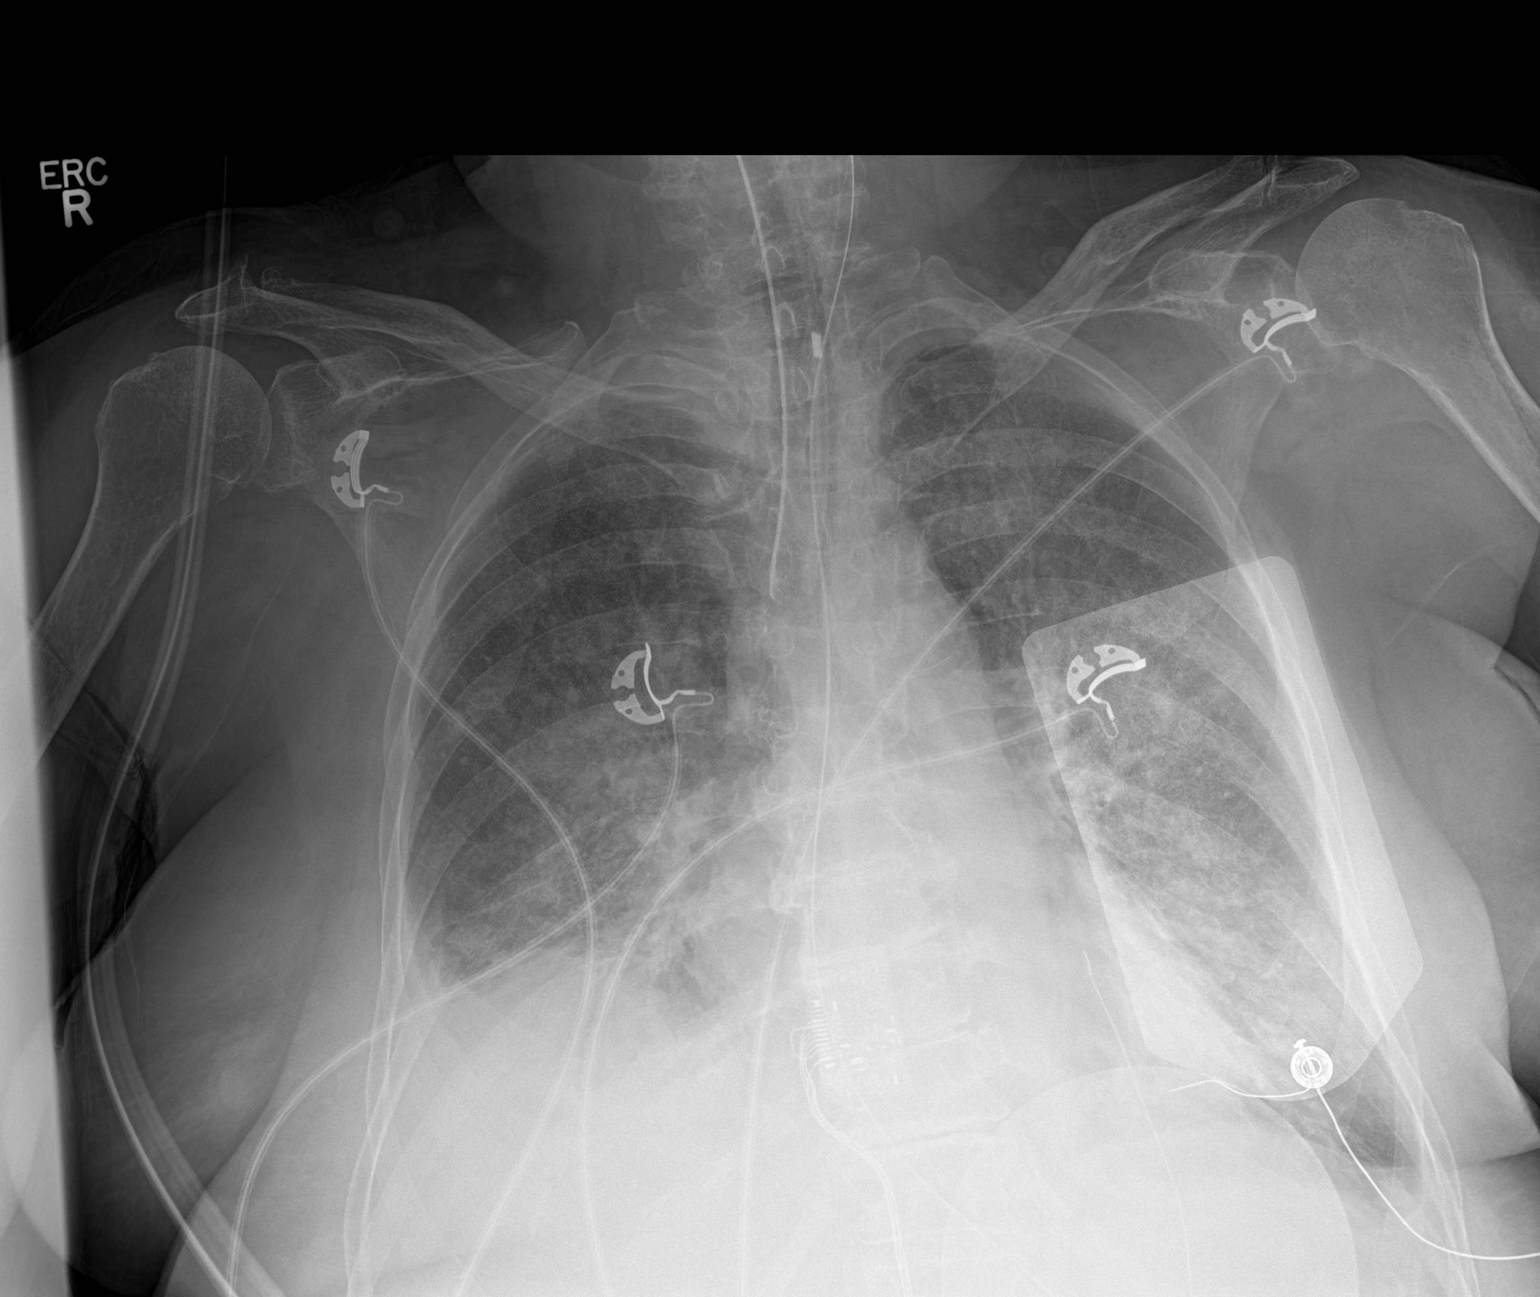

[1 of 1 positions shown; findings below may reference images not displayed]

FINDINGS: The patient's endotracheal tube is seen ending 2-3 cm above the
carina. An enteric tube is noted extending below the diaphragm.

There is mild elevation of the right hemidiaphragm. Vascular
congestion is noted. Increased interstitial markings raise concern
for pulmonary edema. Small bilateral pleural effusions are noted. No
pneumothorax is seen.

The cardiomediastinal silhouette is mildly enlarged. No acute
osseous abnormalities are identified. External pacing pads are
noted.
IMPRESSION: 1. Endotracheal tube seen ending 2-3 cm above the carina.
2. Mild elevation of the right hemidiaphragm. Vascular congestion
and mild cardiomegaly. Increased interstitial markings raise concern
for pulmonary edema. Small bilateral pleural effusions seen.

## 2018-12-02 IMAGING — DX DG CHEST 1V PORT
1 series · 1 of 1 positions shown · non-contrast
Comparison: 03/20/17

CLINICAL DATA: Acute respiratory failure

EXAM:
PORTABLE CHEST 1 VIEW

[chest ap]
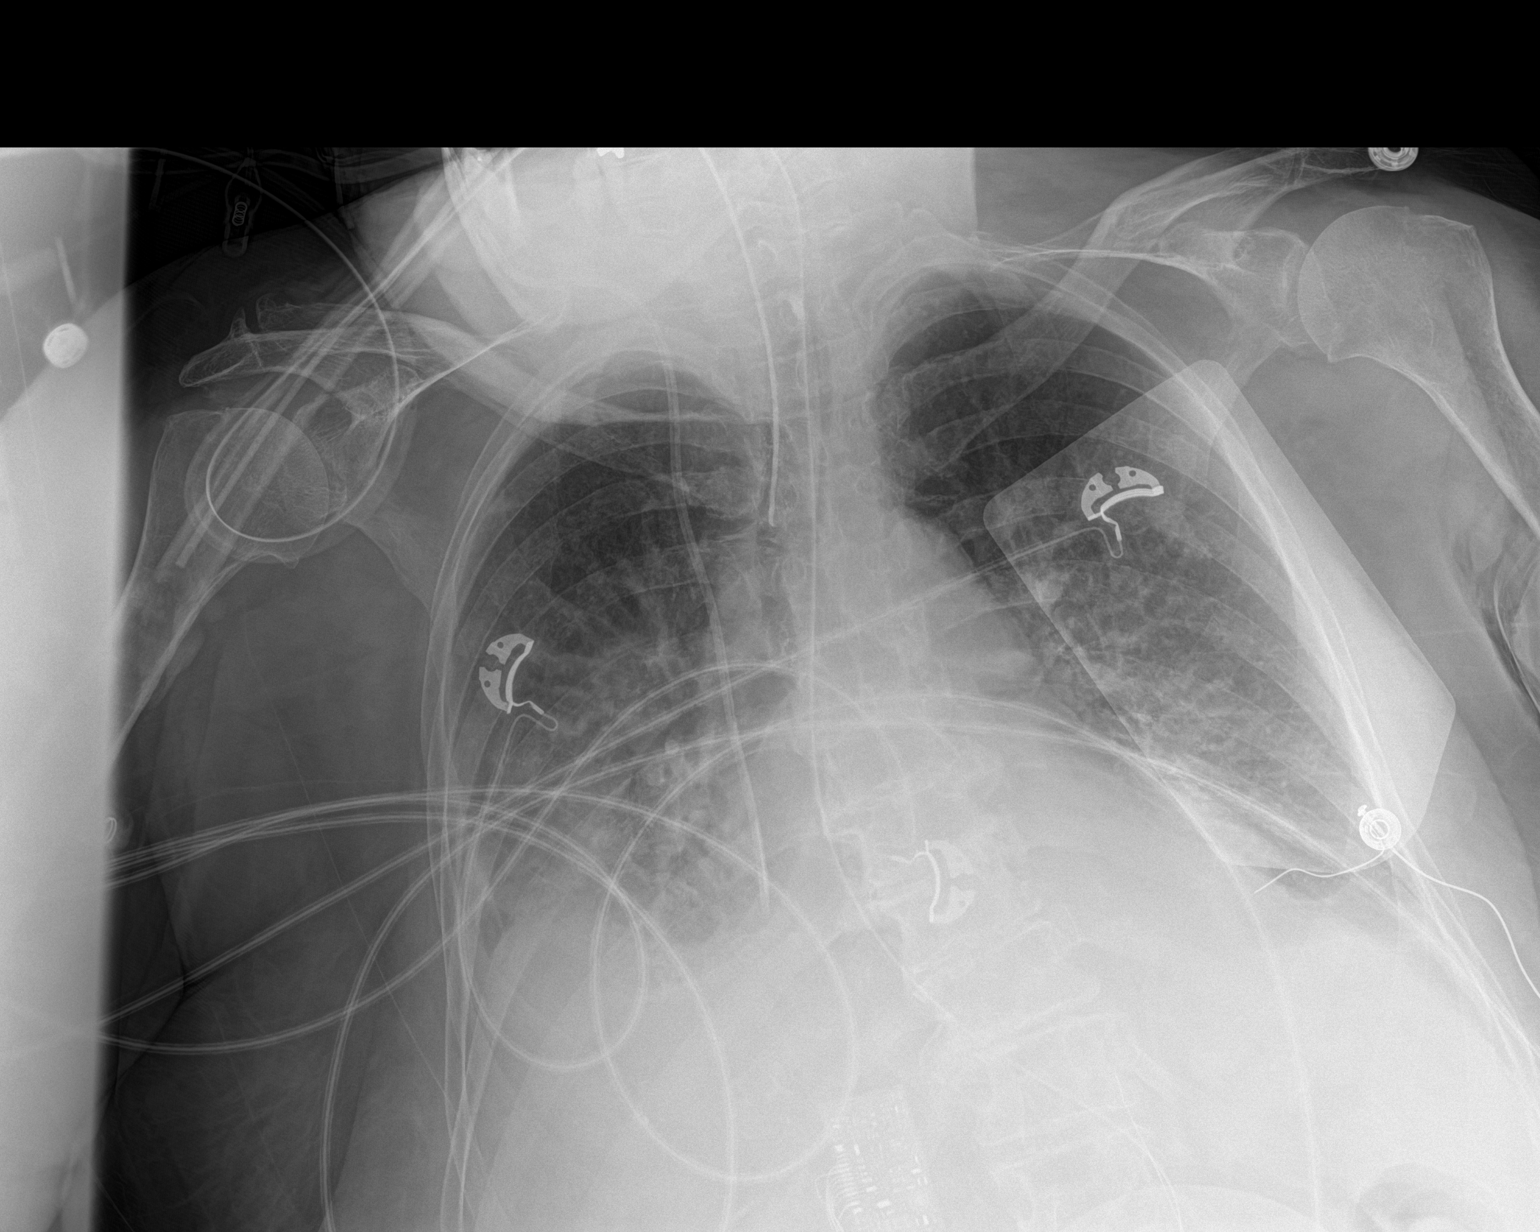

[1 of 1 positions shown; findings below may reference images not displayed]

FINDINGS: Endotracheal tube, nasogastric catheter and right jugular central
line are again seen and stable. Bibasilar infiltrative changes are
again seen right greater than left. The degree of central vascular
congestion and edema has improved significantly in the interval from
the prior exam. No new focal abnormality is noted.
IMPRESSION: Resolving pulmonary edema and vascular congestion.

Bibasilar infiltrative changes right greater than left.

## 2018-12-03 IMAGING — DX DG CHEST 1V
1 series · 1 of 1 positions shown · non-contrast
Comparison: Chest radiograph from one day prior.

CLINICAL DATA: Dyspnea

EXAM:
CHEST 1 VIEW

[chest ap]
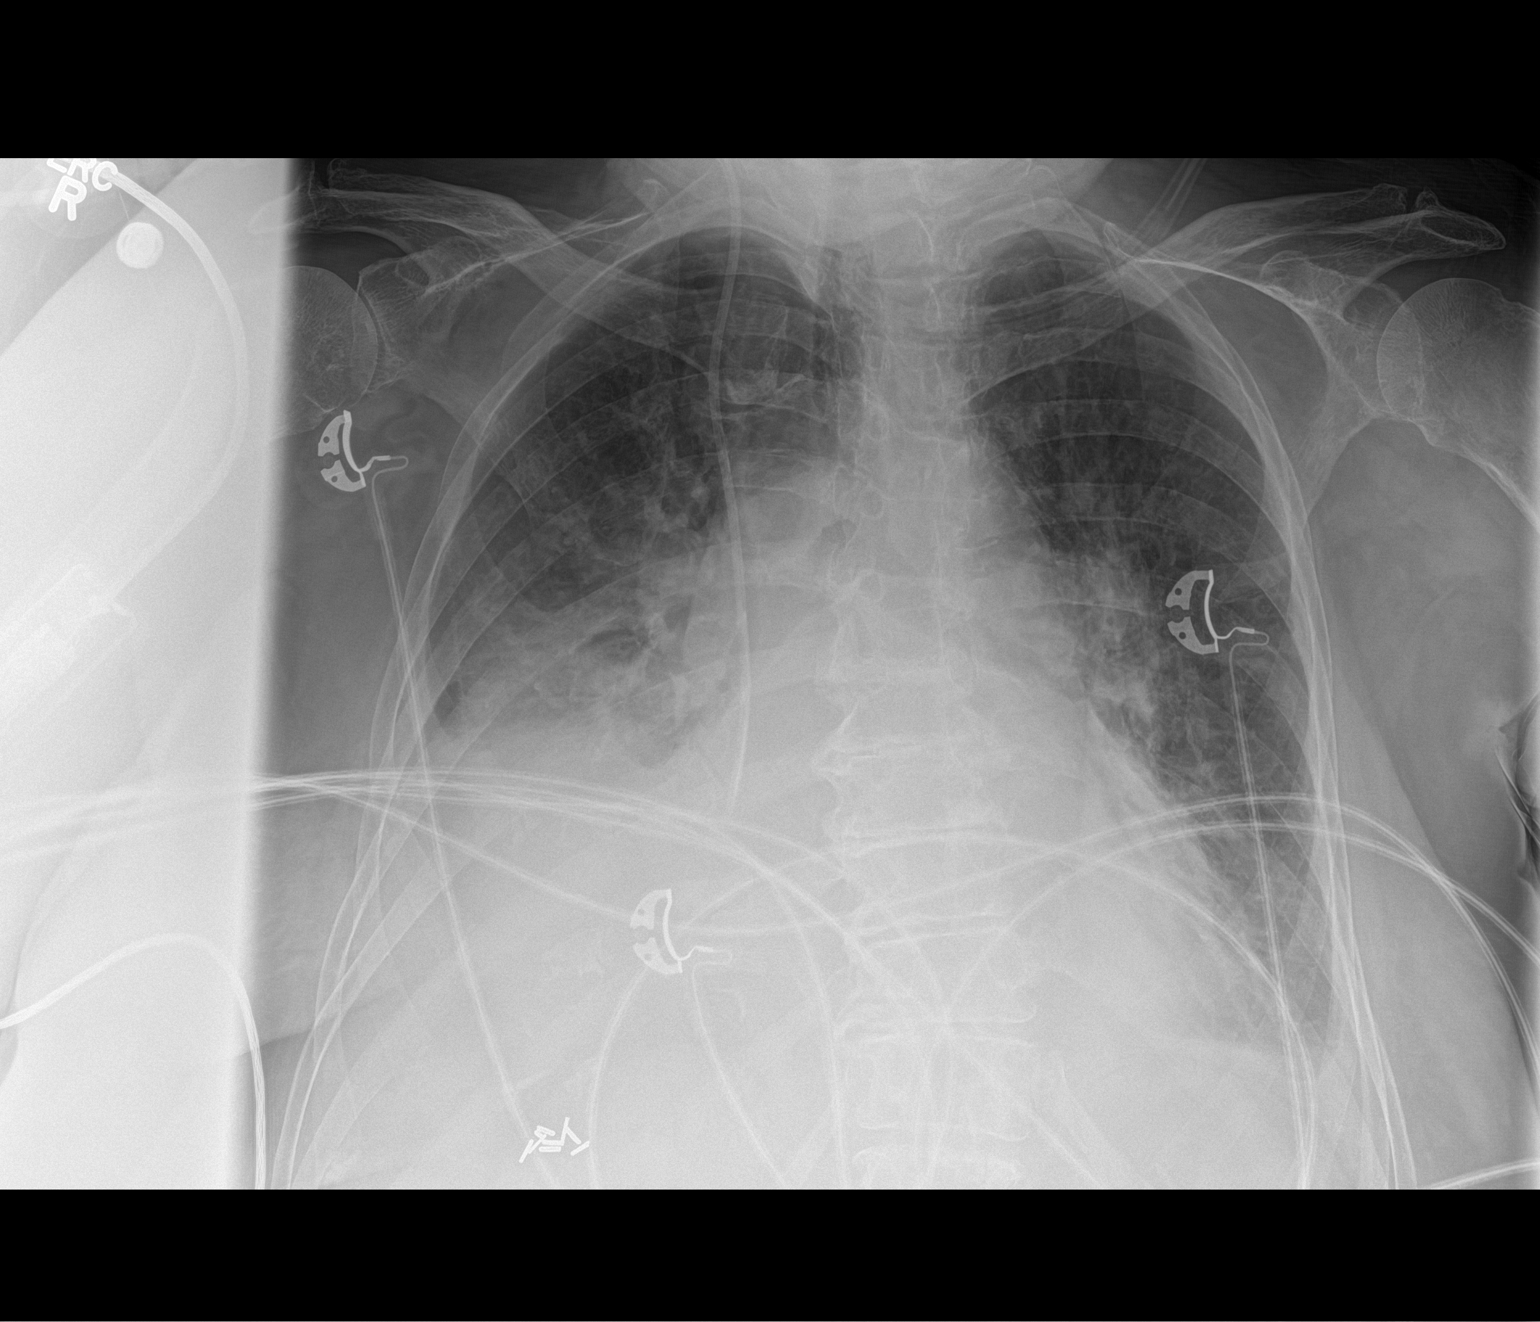

[1 of 1 positions shown; findings below may reference images not displayed]

FINDINGS: Right internal jugular central venous catheter terminates over the
right atrium. Cholecystectomy clips are seen in the right upper
quadrant of the abdomen. Stable cardiomediastinal silhouette with
mild cardiomegaly. No pneumothorax. Stable small bilateral pleural
effusions. Mild-to-moderate pulmonary edema appears stable. Stable
bibasilar patchy lung opacities.
IMPRESSION: 1. Stable mild-to-moderate pulmonary edema.
2. Stable small bilateral pleural effusions.
3. Stable patchy bibasilar lung opacities, which could represent
atelectasis, aspiration and/or pneumonia.

## 2018-12-16 ENCOUNTER — Other Ambulatory Visit: Payer: Self-pay

## 2018-12-16 ENCOUNTER — Encounter: Payer: Self-pay | Admitting: Nurse Practitioner

## 2018-12-16 ENCOUNTER — Non-Acute Institutional Stay: Payer: Medicare Other | Admitting: Nurse Practitioner

## 2018-12-16 VITALS — BP 100/54 | HR 78 | Temp 97.9°F | Resp 18 | Wt 151.1 lb

## 2018-12-16 DIAGNOSIS — R0602 Shortness of breath: Secondary | ICD-10-CM

## 2018-12-16 DIAGNOSIS — R531 Weakness: Secondary | ICD-10-CM

## 2018-12-16 DIAGNOSIS — Z515 Encounter for palliative care: Secondary | ICD-10-CM

## 2018-12-16 NOTE — Progress Notes (Signed)
[facility] Consult Note Telephone: [phone]  Fax: [phone]  [name] DOB: 1946-05-21 MRN: [id number]  PRIMARY CARE PROVIDER:   Donato Schultz, MD  REFERRING PROVIDER:  Dr [facility] RESPONSIBLE PARTY:   Self  1.Palliative care encounter Z51.5; Palliative medicine team will continue to support patient, patient's family, and medical team. Visit consisted of counseling and education dealing with the complex and emotionally intense issues of symptom management and palliative care in the setting of serious and potentially life-threatening illness  2.Dyspneic R06.00 secondary to congestive heart failure remain stable at present time. Continue daily weights and outpatient Cardiology appointments  3.Generalized weaknessR53.1 secondary to CHFcontinue with therapy as able. Encourage energy conservation and rest times.  ASSESSMENT:     I visited and observed Meghan [name]. We talked about purpose of palliative care visit and Meghan [name] was in agreement. We talked about how Meghan [name] was feeling today. Meghan [name] shared that Meghan [name] [name] working with therapy to be able to stand. Meghan [name] [name] glad that her immobilizer came off her leg it feels so much better. We talked about symptoms of the pain which seems to be controlled with current regiment of Oxycodone 5 / 325 mg bid, Lyrica 50 mg bid. 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 We talked about shortness of breath what Meghan [name] currently denies. We talked about her fracture, surgery, pacemaker. We talked about medical goals of care including aggressive versus conservative versus comfort care. We talked about code status as prior to hospitalization Meghan [name] was a DNR and returned a full code. We talked about different scenarios with resuscitation. Meghan [name] wanted to further discuss with her daughter [facility] and granddaughter Hildred Alamin. Meghan [name] asked that I contact [facility] for  further discussion about resuscitation. I called and left [facility] message to return my call. We talked about residing at long-term care facility. We talked about barriers and things that Meghan [name] [name] concerned about. We talked about role of palliative care and plan of care. Meghan [name] expressed Meghan [name] [name] very thankful for palliative care visit and support. Therapeutic listening and emotional support provided. Discussed that will follow up in one link with further discussion and revisit goals of care and code status. Meghan [name] in agreement. I updated nursing staff.  5 / 7 / 2020 weight 151.1 lb  I spent 60 minutes providing this consultation,  from 11:30am to 12:30pm. More than 50% of the time in this consultation was spent coordinating communication.   HISTORY OF PRESENT ILLNESS:  Meghan [name] [name] a 73 y.o. year old female with multiple medical problems including COPD, congestive heart failure with ef 20%, asthma, collagen vascular disease, coronary artery disease, heart murmur, benign brain tumor, hypertension, gerd, hypercholesterolemia, history of headache, diabetes, gerd, arthritis, osteoporosis, lumbar degenerative disc disease, anxiety, PTSD, depression, kyphoplasty, right hand reconstruction 2011, cholecystectomy, cervical fusion, abdominal hysterectomy. Meghan [name] in [place] at [facility]. Meghan [name] does require assistance with ADLs, positioning with  her healing fracture.  Meghan [name] does feed herself and appetite has been Fair  depending on what's being served. Last provider visit for 08/2018 for follow-up of fracture of lower end of right femur close with routine healing and pacemaker placement. Meghan [name] sustained a fall,  fractured her distal femur with s/p ORIF on 3 / 14 / 2020. Meghan [name] did have a follow-up Orthopedic appointment 3 / 27 / 2020 but declined to go not  feeling well. Meghan [name] [name] improving with leg Mobility with therapy. Appointment was rescheduled for April 3rd, 2020. Meghan [name]  [name] followed by Psychotherapy in addition to Psychiatry at the facility. Last psychiatric visit 4 / 16 / 2020 for bipolar 2 disorder mostly depressive with PTSD due to recent traumatic event and anxiety. Meghan [name] does take Lamictal for mood, Klonopin and Buspar for anxiety come out Lexapro for mood, Remeron for appetite stimulant. Lyrica for migraines per primary provider and no adjustment at that time. Meghan. [name] [name] able to verbalize her needs. At present Meghan [name] [name] lying in bed. Meghan [name] appears chronically ill but comfortable. No visitors present. Palliative Care was asked to help to continue to address goals of care.   CODE STATUS: Full code  PPS: 40% HOSPICE ELIGIBILITY/DIAGNOSIS: TBD  PAST MEDICAL HISTORY:  Past Medical History:  Diagnosis Date   Anginal pain ([place])    Anxiety    Arthritis    RA   Asthma    Brain tumor (benign) (HCC)    CHF (congestive heart failure) (HCC)    Collagen vascular disease (HCC)    COPD (chronic obstructive pulmonary disease) (HCC)    Coronary artery disease    DDD (degenerative disc disease)    Depression    Diabetes mellitus    GERD (gastroesophageal reflux disease)    Headache    Heart murmur    Heart murmur    Hypercholesteremia    Hypertension    Lumbar degenerative disc disease    Migraines    Obesity    Osteopenia    Osteoporosis    Pneumonia    PTSD (post-traumatic stress disorder)     SOCIAL HX:  Social History   Tobacco Use   Smoking status: Never Smoker   Smokeless tobacco: Never Used  Substance Use Topics   Alcohol use: No    Alcohol/week: 0.0 standard drinks    ALLERGIES:  Allergies  Allergen Reactions   Amoxicillin Itching and Other (See Comments)    Has patient had a PCN reaction causing immediate rash, facial/tongue/throat swelling, SOB or lightheadedness with hypotension: Unknown Has patient had a PCN reaction causing severe rash involving mucus membranes or skin necrosis: Unknown Has patient had a  PCN reaction that required hospitalization: Unknown Has patient had a PCN reaction occurring within the last 10 years: Unknown If all of the above answers are "NO", then may proceed with Cephalosporin use.    Gabapentin Other (See Comments)    Pt states that it causes her BP to drop.    Naproxen Hives   Nsaids Other (See Comments)    Reaction:  Unknown    Tolmetin Other (See Comments)    Reaction:  Unknown      PERTINENT MEDICATIONS:  Outpatient Encounter Medications as of 12/16/2018  Medication Sig   busPIRone (BUSPAR) 7.5 MG tablet Take 1 tablet by mouth 2 (two) times daily.   Cholecalciferol 5000 units TABS Take 5,000 Units by mouth every 30 (thirty) days.   clonazePAM (KLONOPIN) 0.5 MG tablet Take 0.125 mg by mouth 2 (two) times daily.   cyanocobalamin 500 MCG tablet Take 500 mcg by mouth daily.   diclofenac sodium (VOLTAREN) 1 % GEL Apply 2 g topically 3 (three) times daily. Apply to the fingers   diphenoxylate-atropine (LOMOTIL) 2.5-0.025 MG tablet Take 1 tablet by mouth every 6 (six) hours as needed for diarrhea or loose stools.    escitalopram (LEXAPRO) 10 MG tablet Take 1 tablet (10 mg total) by mouth every morning. (Patient  taking differently: Take 20 mg by mouth every morning. )   fluticasone (VERAMYST) 27.5 MCG/SPRAY nasal spray Place 2 sprays into the nose 2 (two) times daily.   folic acid (FOLVITE) 1 MG tablet Take 1 mg by mouth daily.   Ipratropium-Albuterol (COMBIVENT RESPIMAT) 20-100 MCG/ACT AERS respimat Inhale 2 puffs into the lungs every 6 (six) hours.   lamoTRIgine (LAMICTAL) 100 MG tablet Take 1 tablet (100 mg total) by mouth daily.   loperamide (IMODIUM A-D) 2 MG tablet Take 1 tablet (2 mg total) by mouth 4 (four) times daily as needed for diarrhea or loose stools. (Patient taking differently: Take 2 mg by mouth as needed for diarrhea or loose stools. )   loratadine (CLARITIN) 10 MG tablet Take 10 mg by mouth daily.   mirtazapine (REMERON) 7.5 MG  tablet Take 7.5 mg by mouth at bedtime.    montelukast (SINGULAIR) 10 MG tablet Take 10 mg by mouth daily.   omeprazole (PRILOSEC) 20 MG capsule Take 20 mg by mouth daily.    oxyCODONE-acetaminophen (PERCOCET/ROXICET) 5-325 MG tablet Take 1 tablet by mouth every 4 (four) hours as needed for moderate pain or severe pain.   Potassium Chloride ER 20 MEQ TBCR Take 20 mEq by mouth 2 (two) times daily.   pramipexole (MIRAPEX) 0.25 MG tablet Take 0.25 mg by mouth daily.    predniSONE (DELTASONE) 5 MG tablet Take 1 tablet (5 mg total) by mouth 2 (two) times daily with a meal.   saxagliptin HCl (ONGLYZA) 5 MG TABS tablet Take 5 mg by mouth at bedtime.   Tofacitinib Citrate 5 MG TABS Take 5 mg by mouth 2 (two) times daily after a meal.    topiramate (TOPAMAX) 25 MG tablet Take 25 mg by mouth at bedtime.    No facility-administered encounter medications on file as of 12/16/2018.     PHYSICAL EXAM:   General: NAD, chronically ill, pleasant female Cardiovascular: regular rate and rhythm Pulmonary: clear ant fields Abdomen: soft, nontender, + bowel sounds GU: no suprapubic tenderness Extremities: +BLE edema, no joint deformities Skin: no rashes Neurological: Weakness but otherwise nonfocal  Nabil Bubolz Ihor Gully, NP

## 2018-12-23 ENCOUNTER — Non-Acute Institutional Stay: Payer: Medicare Other | Admitting: Nurse Practitioner

## 2018-12-23 ENCOUNTER — Encounter: Payer: Self-pay | Admitting: Nurse Practitioner

## 2018-12-23 ENCOUNTER — Other Ambulatory Visit: Payer: Self-pay

## 2018-12-23 VITALS — BP 112/60 | HR 82 | Temp 98.0°F | Resp 18 | Wt 153.8 lb

## 2018-12-23 DIAGNOSIS — R0602 Shortness of breath: Secondary | ICD-10-CM

## 2018-12-23 DIAGNOSIS — R531 Weakness: Secondary | ICD-10-CM

## 2018-12-23 DIAGNOSIS — Z515 Encounter for palliative care: Secondary | ICD-10-CM

## 2018-12-23 NOTE — Progress Notes (Signed)
[name] Telephone: [phone]  Fax: [phone]  [name] DOB: 04/28/1946 MRN: [id number]  PRIMARY CARE PROVIDER:   Donato Schultz, MD  REFERRING PROVIDER:  Dr [name] RESPONSIBLE PARTY:   Meghan [name]   RECOMMENDATIONS and PLAN:  1.Palliative care encounter Z51.5; Palliative medicine team will continue to support patient, patient's family, and medical team. Visit consisted of counseling and education dealing with the complex and emotionally intense issues of symptom management and palliative care in the setting of serious and potentially life-threatening illness  2.Dyspneic R06.00 secondary to congestive heart failure remain stable at present time. Continue daily weights and outpatient Cardiology appointments  3.Generalized weaknessR53.1 secondary to CHFcontinue with therapy as able. Encourage energy conservation and rest times.  ASSESSMENT:  I visited and observed Meghan [name]. She is her own Media planner. We talked about purpose of palliative care visit. We talked about the last palliative care visit where we talked about the most form, in addition to code status. She was a full code. Meghan [name] nurse practitioner did have a discussion with Meghan [name] concerning code status and which is hard to be a DNR. Most form was completed with DNR, limited additional interventions, antibiotics if syndicated, IV fluids if indicated, new feeding tube in signed by Meghan [name]. Ask Meghan [name] if she had any questions or concerns and she replied no. Discuss with Meghan [name] not able to get in touch with her but she shared that Meghan [name] her granddaughter visited over the weekend. We talked about visiting policy restriction with covid-19. Emotional support provided. We talked about her work with therapy. Praise her for working hard. We talked about symptoms of pain which has been effectively managed. We talked  about shortness of breath what she denies. We talked about edema which has been managed. We talked about role of palliative care and plan of care. Discuss will follow up in two months if needed or sooner should she declined. Meghan [name] and agreement.  I spent 45 minutes providing this consultation,  from 9:45am to 10:30am. More than 50% of the time in this consultation was spent coordinating communication.   HISTORY OF PRESENT ILLNESS:  Meghan [name] is a 73 y.o. year old female with multiple medical problems including COPD, congestive heart failure with ef 20%, asthma, collagen vascular disease, coronary artery disease, heart murmur, benign brain tumor, hypertension, gerd, hypercholesterolemia, history of headache, diabetes, gerd, arthritis, osteoporosis, lumbar degenerative disc disease, anxiety, PTSD, depression, kyphoplasty, right hand reconstruction 2011, cholecystectomy, cervical fusion, abdominal hysterectomy Meghan. [name] continues to reside in skilled Meghan [name] at [name]. Today visit for follow-up for most form which actually was completed by Meghan [name] nurse practitioner yesterday with Meghan [name]. Hope her daughter has not returned palliative care call. Meghan [name] does continue to require assistance with ADLs, tray setup. She is able to feed herself. Appetite slowly improving with weight 153.8 lbs. Pain is currently being managed what's scheduled Lyrica and Oxycodone 5 / 325 mg bid with effective pain management. She also gets Methotrexate and Voltaren gel for rheumatoid arthritis. She continues to work with therapy, doing well. No recent Falls. At present she is lying in bed. She appears comfortable. No visitors present. She is oriented and able to verbalize her needs. Palliative Care was asked to help to continue to address goals of care.   CODE STATUS: DNR  PPS: 40% HOSPICE ELIGIBILITY/DIAGNOSIS: TBD  PAST  MEDICAL HISTORY:  Past Medical History:  Diagnosis Date     Anginal pain ([place])    Anxiety    Arthritis    RA   Asthma    Brain tumor (benign) (HCC)    CHF (congestive heart failure) (HCC)    Collagen vascular disease (HCC)    COPD (chronic obstructive pulmonary disease) (HCC)    Coronary artery disease    DDD (degenerative disc disease)    Depression    Diabetes mellitus    GERD (gastroesophageal reflux disease)    Headache    Heart murmur    Heart murmur    Hypercholesteremia    Hypertension    Lumbar degenerative disc disease    Migraines    Obesity    Osteopenia    Osteoporosis    Pneumonia    PTSD (post-traumatic stress disorder)     SOCIAL HX:  Social History   Tobacco Use   Smoking status: Never Smoker   Smokeless tobacco: Never Used  Substance Use Topics   Alcohol use: No    Alcohol/week: 0.0 standard drinks    ALLERGIES:  Allergies  Allergen Reactions   Amoxicillin Itching and Other (See Comments)    Has patient had a PCN reaction causing immediate rash, facial/tongue/throat swelling, SOB or lightheadedness with hypotension: Unknown Has patient had a PCN reaction causing severe rash involving mucus membranes or skin necrosis: Unknown Has patient had a PCN reaction that required hospitalization: Unknown Has patient had a PCN reaction occurring within the last 10 years: Unknown If all of the above answers are "NO", then may proceed with Cephalosporin use.    Gabapentin Other (See Comments)    Pt states that it causes her BP to drop.    Naproxen Hives   Nsaids Other (See Comments)    Reaction:  Unknown    Tolmetin Other (See Comments)    Reaction:  Unknown      PERTINENT MEDICATIONS:  Outpatient Encounter Medications as of 12/23/2018  Medication Sig   busPIRone (BUSPAR) 7.5 MG tablet Take 1 tablet by mouth 2 (two) times daily.   Cholecalciferol 5000 units TABS Take 5,000 Units by mouth every 30 (thirty) days.   clonazePAM (KLONOPIN) 0.5 MG tablet Take 0.125 mg by mouth 2  (two) times daily.   cyanocobalamin 500 MCG tablet Take 500 mcg by mouth daily.   diclofenac sodium (VOLTAREN) 1 % GEL Apply 2 g topically 3 (three) times daily. Apply to the fingers   diphenoxylate-atropine (LOMOTIL) 2.5-0.025 MG tablet Take 1 tablet by mouth every 6 (six) hours as needed for diarrhea or loose stools.    escitalopram (LEXAPRO) 10 MG tablet Take 1 tablet (10 mg total) by mouth every morning. (Patient taking differently: Take 20 mg by mouth every morning. )   fluticasone (VERAMYST) 27.5 MCG/SPRAY nasal spray Place 2 sprays into the nose 2 (two) times daily.   folic acid (FOLVITE) 1 MG tablet Take 1 mg by mouth daily.   Ipratropium-Albuterol (COMBIVENT RESPIMAT) 20-100 MCG/ACT AERS respimat Inhale 2 puffs into the lungs every 6 (six) hours.   lamoTRIgine (LAMICTAL) 100 MG tablet Take 1 tablet (100 mg total) by mouth daily.   loperamide (IMODIUM A-D) 2 MG tablet Take 1 tablet (2 mg total) by mouth 4 (four) times daily as needed for diarrhea or loose stools. (Patient taking differently: Take 2 mg by mouth as needed for diarrhea or loose stools. )   loratadine (CLARITIN) 10 MG tablet Take 10 mg by mouth daily.  mirtazapine (REMERON) 7.5 MG tablet Take 7.5 mg by mouth at bedtime.    montelukast (SINGULAIR) 10 MG tablet Take 10 mg by mouth daily.   omeprazole (PRILOSEC) 20 MG capsule Take 20 mg by mouth daily.    oxyCODONE-acetaminophen (PERCOCET/ROXICET) 5-325 MG tablet Take 1 tablet by mouth every 4 (four) hours as needed for moderate pain or severe pain.   Potassium Chloride ER 20 MEQ TBCR Take 20 mEq by mouth 2 (two) times daily.   pramipexole (MIRAPEX) 0.25 MG tablet Take 0.25 mg by mouth daily.    predniSONE (DELTASONE) 5 MG tablet Take 1 tablet (5 mg total) by mouth 2 (two) times daily with a meal.   saxagliptin HCl (ONGLYZA) 5 MG TABS tablet Take 5 mg by mouth at bedtime.   Tofacitinib Citrate 5 MG TABS Take 5 mg by mouth 2 (two) times daily after a meal.     topiramate (TOPAMAX) 25 MG tablet Take 25 mg by mouth at bedtime.    No facility-administered encounter medications on file as of 12/23/2018.     PHYSICAL EXAM:   General: chronically ill, pleasant female Cardiovascular: regular rate and rhythm Pulmonary: clear ant fields Abdomen: soft, nontender, + bowel sounds GU: no suprapubic tenderness Extremities: mild BLE edema, no joint deformities Skin: no rashes Neurological: Weakness but otherwise nonfocal  Jorrell Kuster Ihor Gully, NP

## 2018-12-27 ENCOUNTER — Other Ambulatory Visit: Payer: Self-pay

## 2018-12-28 IMAGING — DX DG CHEST 1V PORT
1 series · 1 of 1 positions shown · non-contrast
Comparison: 03/22/2017

CLINICAL DATA: Pt found unresponsive and in respiratory failure
today. Hx of asthma, brain tumor, CHF, COPD, CAD, diabetes, heart
murmur, hypertension, pneumonia. Non smoker

EXAM:
PORTABLE CHEST 1 VIEW

[chest ap]
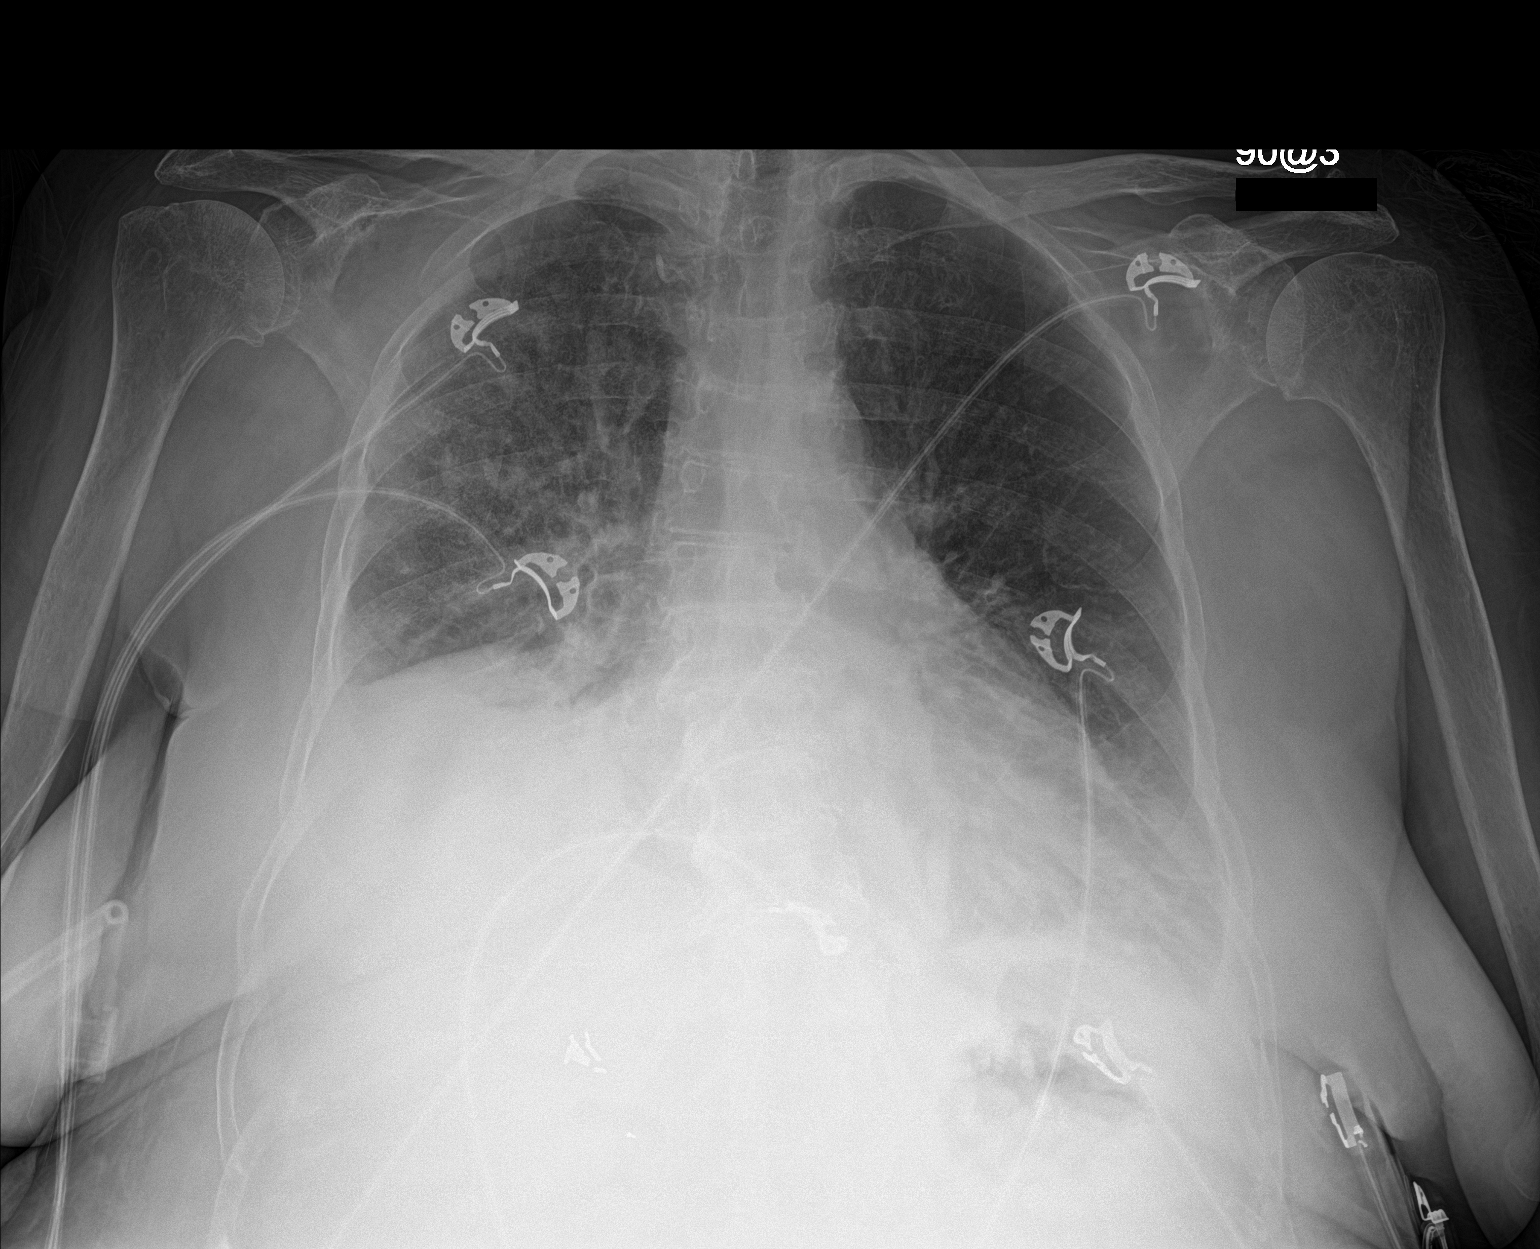

[1 of 1 positions shown; findings below may reference images not displayed]

FINDINGS: There is significant dense opacity at the right lung base, likely
indicating pleural effusion and consolidation or significant
atelectasis, or portion of which is ongoing. There is a small left
pleural effusion. The heart is mildly enlarged. There are
interstitial changes consistent with mild edema. Mild patchy opacity
in the lung bases could represent edema and/or infectious process.
Right IJ line has been removed.
IMPRESSION: 1. Persistent/significant opacity at the right lung base compatible
with pleural effusion and atelectasis or infiltrate.
2. Interstitial pulmonary edema.
3. Bilateral lower lobe patchy of consistent with edema and/or
infectious process.

## 2019-01-15 IMAGING — DX DG CHEST 1V PORT
1 series · 1 of 1 positions shown · non-contrast
Comparison: 04/17/2017

CLINICAL DATA: Dyspnea

EXAM:
PORTABLE CHEST 1 VIEW

[chest ap]
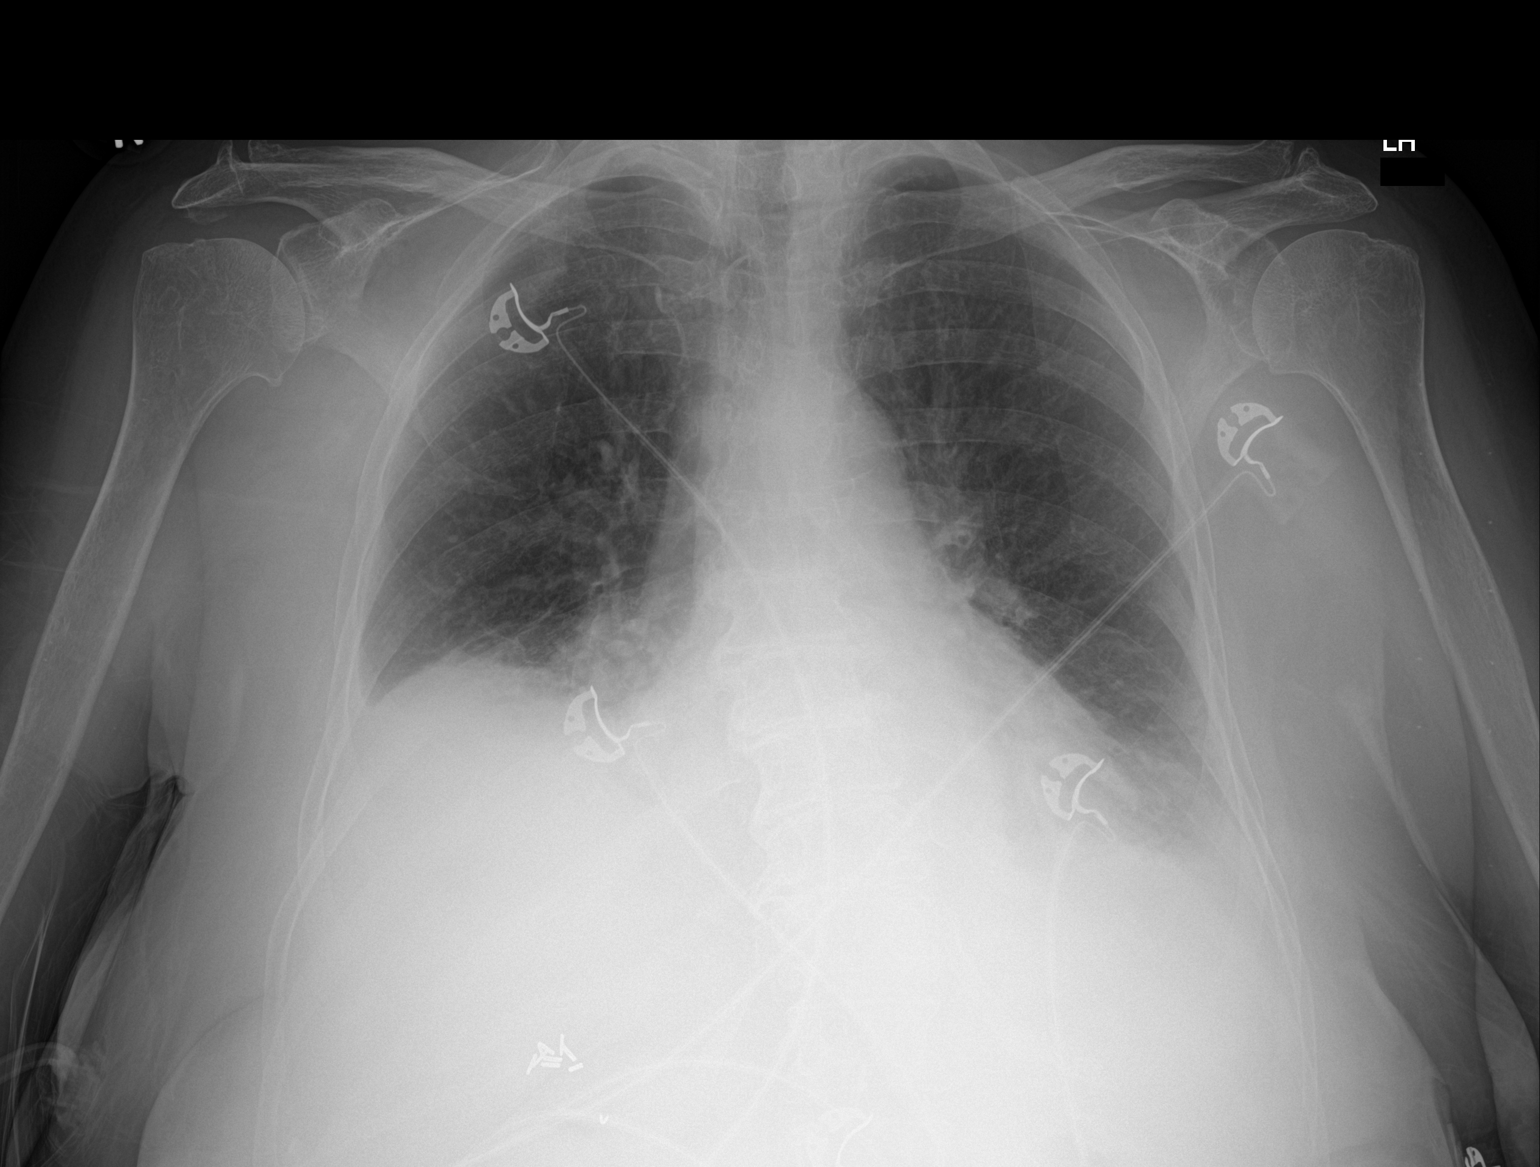

[1 of 1 positions shown; findings below may reference images not displayed]

FINDINGS: Low volume chest with greater elevation of the right diaphragm.
Streaky opacities at the bases. Probable small pleural effusions.
Normal heart size. Stable mediastinal contours. No edema or
pneumothorax.
IMPRESSION: Low volume chest with atelectasis and small effusions at the bases,
stable from 04/17/2017. Superimposed infection could be obscured.

## 2019-01-16 IMAGING — DX DG CHEST 1V PORT
1 series · 1 of 1 positions shown · non-contrast
Comparison: 05/04/2017

CLINICAL DATA: Pt in via EMS from home with c/o syncope. EMS
reports pt was here for MI 3 weeks ago and just got home from rehab
on [REDACTED]. EMS reports per pt she woke up twice and felt like she
had passed out both times. EMS reports pt was diaphoretic upon
arrival. Pt denies pain at this time but reports feels nauseated and
has had some diarrhea. EMS reports FBSB 95, BP 124/80. Pt denies CP,
SOB at this time. States she had diarrhea and feels nauseated

EXAM:
PORTABLE CHEST 1 VIEW

[chest ap]
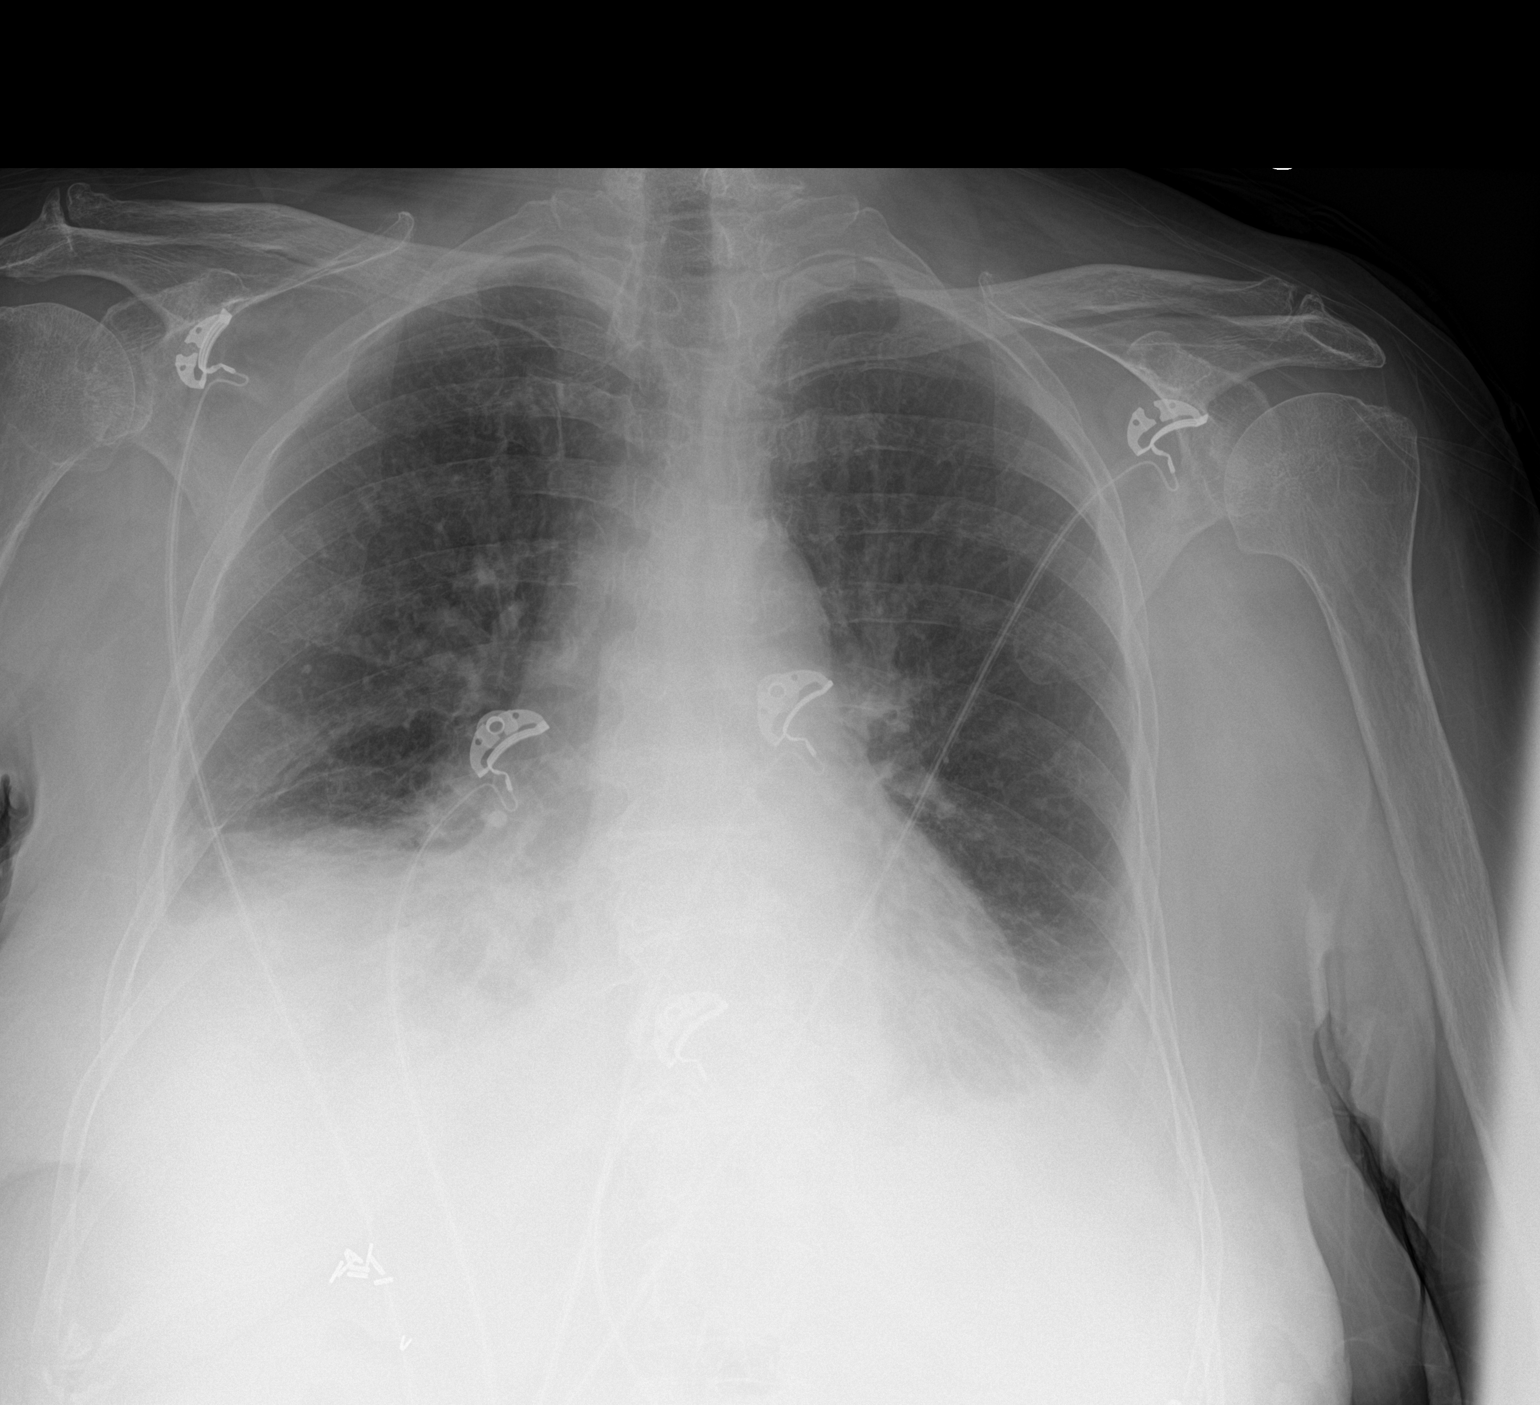

[1 of 1 positions shown; findings below may reference images not displayed]

FINDINGS: Cardiac silhouette is mildly enlarged. No mediastinal or hilar
masses.

There is opacity at both lung bases obscuring hemidiaphragms
consistent with a combination of pleural effusions with either
atelectasis, pneumonia or a combination. There is no evidence of
pulmonary edema. Remainder of the lungs is clear.

No pneumothorax.

Skeletal structures are demineralized but grossly intact.
IMPRESSION: 1. Bilateral lung base opacity consistent with a combination of
pleural effusions and atelectasis. Pneumonia is possible. Lung base
opacity is mildly increased from the prior exam, most evident on the
right.
2. No pulmonary edema.

## 2019-01-21 LAB — CUP PACEART REMOTE DEVICE CHECK
Battery Remaining Longevity: 62 mo
Battery Remaining Percentage: 95.5 %
Battery Voltage: 2.99 V
Brady Statistic AP VP Percent: 1 %
Brady Statistic AP VS Percent: 1 %
Brady Statistic AS VP Percent: 75 %
Brady Statistic AS VS Percent: 24 %
Brady Statistic RA Percent Paced: 1.3 %
Brady Statistic RV Percent Paced: 76 %
Date Time Interrogation Session: 20200613080014
Implantable Lead Implant Date: 20200313
Implantable Lead Implant Date: 20200313
Implantable Lead Location: 753859
Implantable Lead Location: 753860
Implantable Lead Model: 5076
Implantable Lead Model: 5076
Implantable Pulse Generator Implant Date: 20200313
Lead Channel Impedance Value: 460 Ohm
Lead Channel Impedance Value: 580 Ohm
Lead Channel Pacing Threshold Amplitude: 0.5 V
Lead Channel Pacing Threshold Amplitude: 0.75 V
Lead Channel Pacing Threshold Pulse Width: 0.5 ms
Lead Channel Pacing Threshold Pulse Width: 0.5 ms
Lead Channel Sensing Intrinsic Amplitude: 12 mV
Lead Channel Sensing Intrinsic Amplitude: 2.9 mV
Lead Channel Setting Pacing Amplitude: 3.5 V
Lead Channel Setting Pacing Amplitude: 5 V
Lead Channel Setting Pacing Pulse Width: 0.5 ms
Lead Channel Setting Sensing Sensitivity: 4 mV
Pulse Gen Model: 2272
Pulse Gen Serial Number: 9118930

## 2019-01-21 IMAGING — CR DG CHEST 2V
2 series · 2 of 2 positions shown · non-contrast
Comparison: May 05, 2017 and April 17, 2017

CLINICAL DATA: Chest tightness and vomiting

EXAM:
CHEST  2 VIEW

[chest lat]
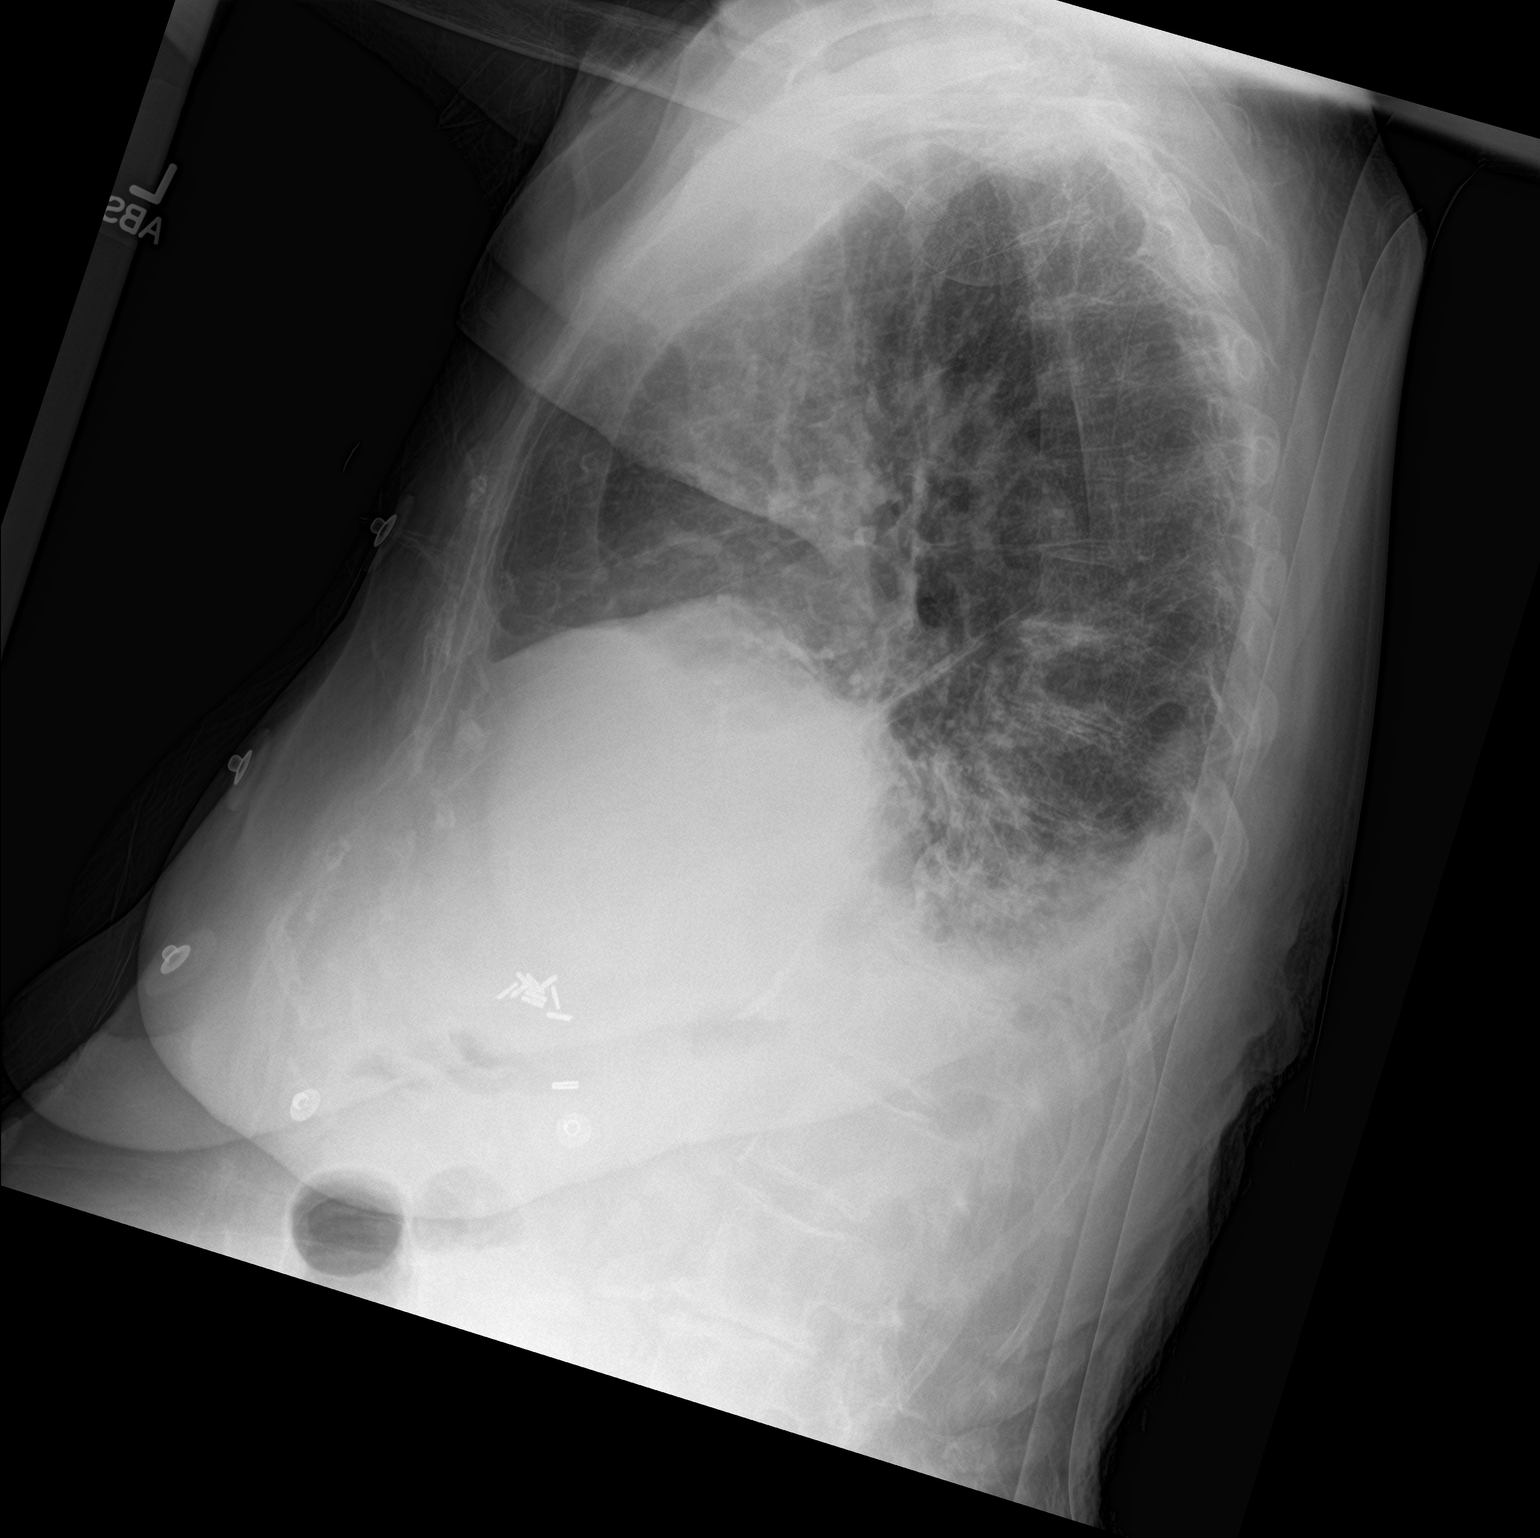

[chest ap]
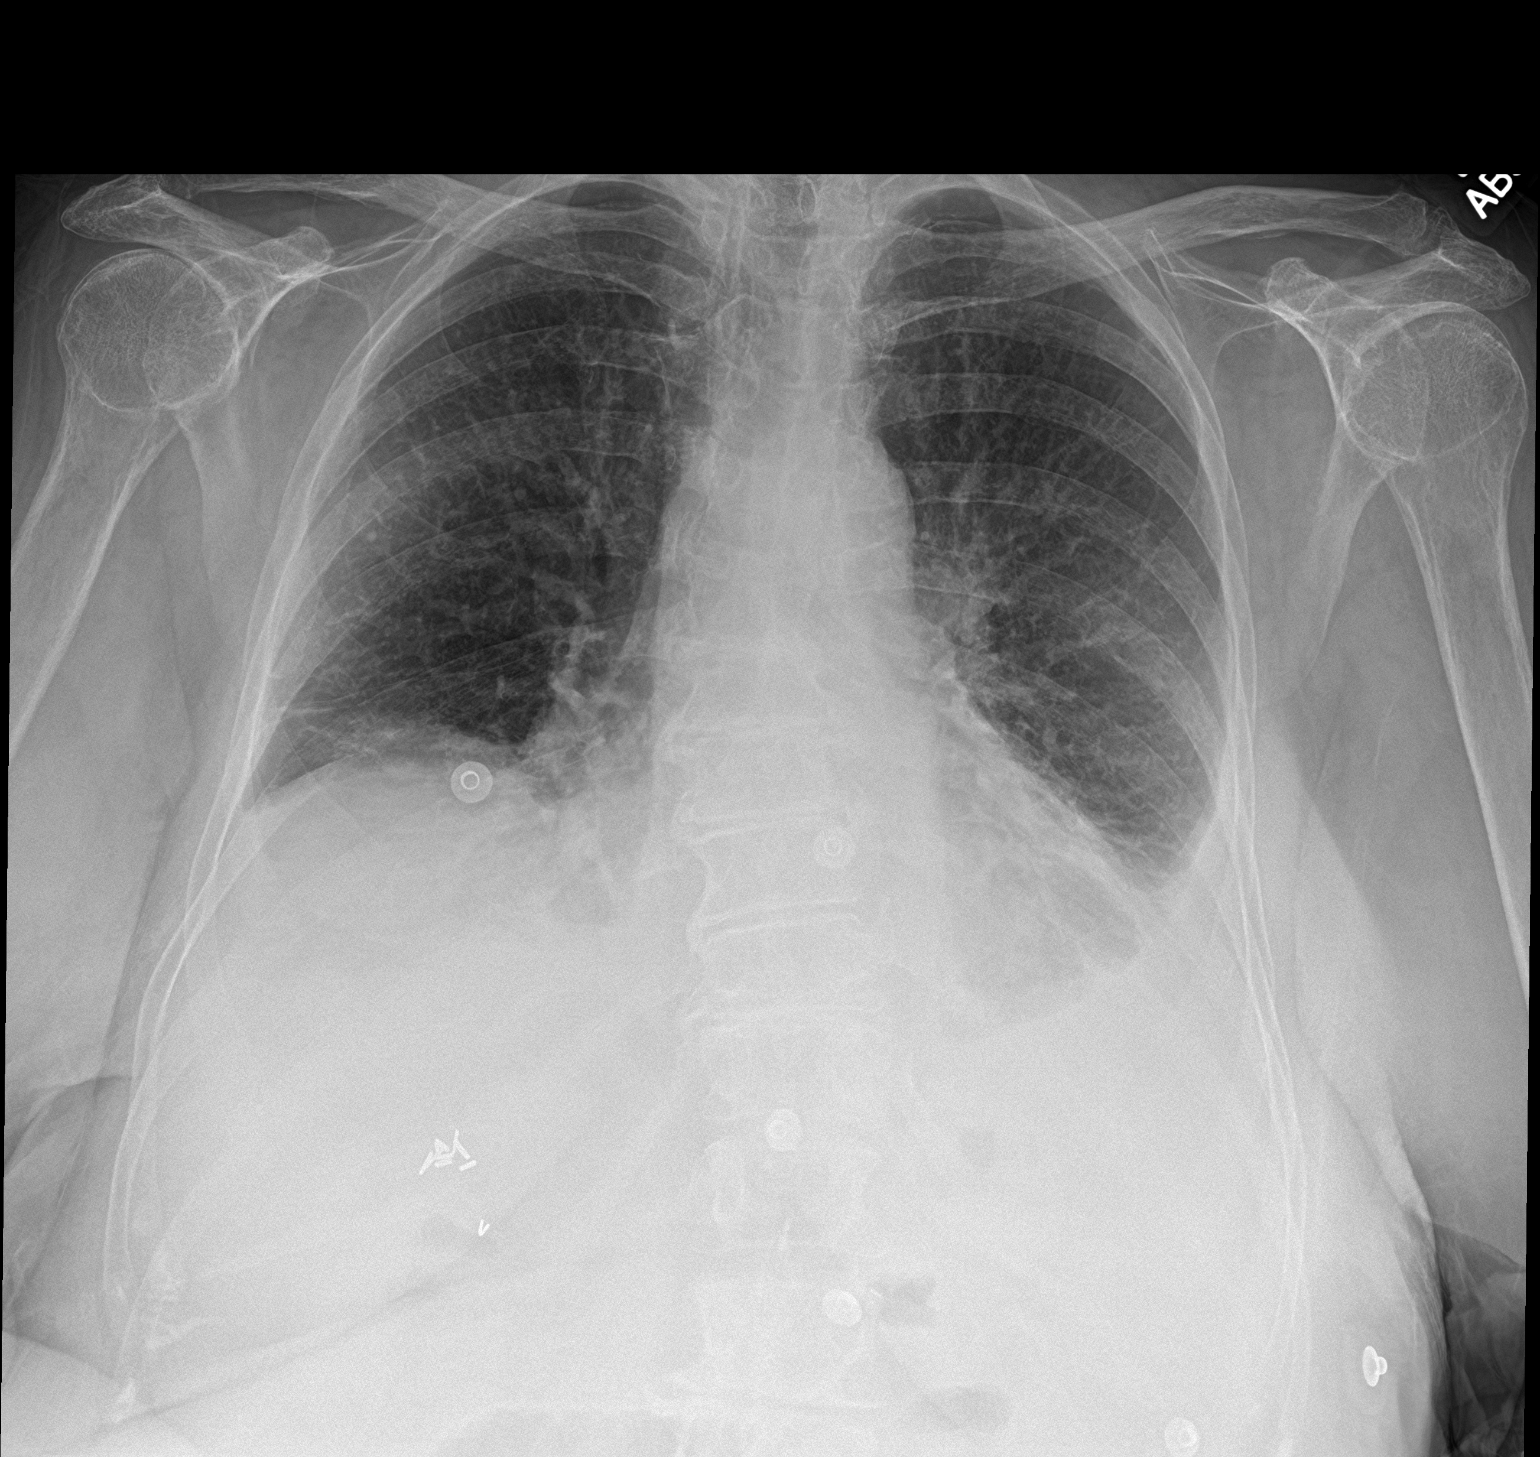

[2 of 2 positions shown; findings below may reference images not displayed]

FINDINGS: There are small bilateral pleural effusions, larger on the left than
on the right, stable. There is bibasilar atelectasis. There is mild
interstitial edema. There is no airspace consolidation. There is
cardiomegaly with pulmonary venous hypertension. No evident
adenopathy. There is aortic atherosclerosis. There is degenerative
change in the thoracic spine. Bones are somewhat osteoporotic. There
is calcification in the left carotid artery.
IMPRESSION: There is evidence of a degree of congestive heart failure. Note that
there is mild interstitial edema but no airspace consolidation
appreciable. There is aortic atherosclerosis. Bones are
osteoporotic. There is a focal area of calcification in the left
carotid artery.

Aortic Atherosclerosis ([id number]-[id number]).

## 2019-01-21 IMAGING — CT CT ABD-PELV W/ CM
2 of 5 series · 16 of 46 positions shown, 18 images · IV contrast (APPLIED)
Comparison: CT scan of February 22, 2017.

CLINICAL DATA: Nausea, vomiting.

EXAM:
CT ABDOMEN AND PELVIS WITH CONTRAST
TECHNIQUE: Multidetector CT imaging of the abdomen and pelvis was performed
using the standard protocol following bolus administration of
intravenous contrast.
CONTRAST:  100mL [id number] IOPAMIDOL ([id number]) INJECTION 61%

[Series 2: routine abd/pel with · axial · 0.71mm/px · z∈[-1147,-742]mm · 13 of 93 slices shown, 15 images]
[im 6/93  soft-tissue]
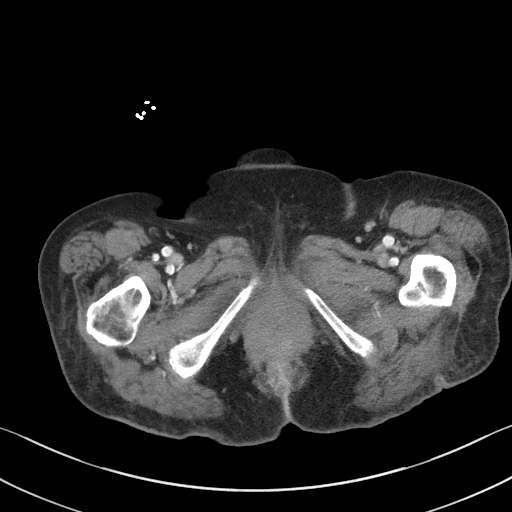
[im 6/93  bone]
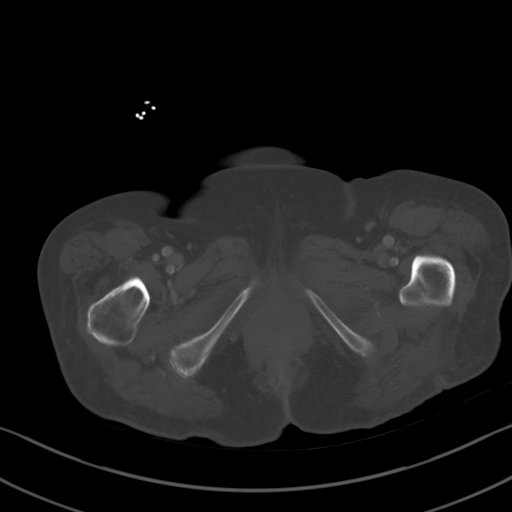
[im 11/93  soft-tissue]
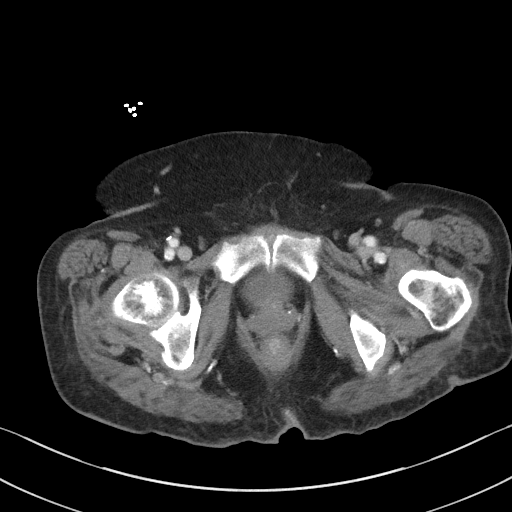
[im 21/93  soft-tissue]
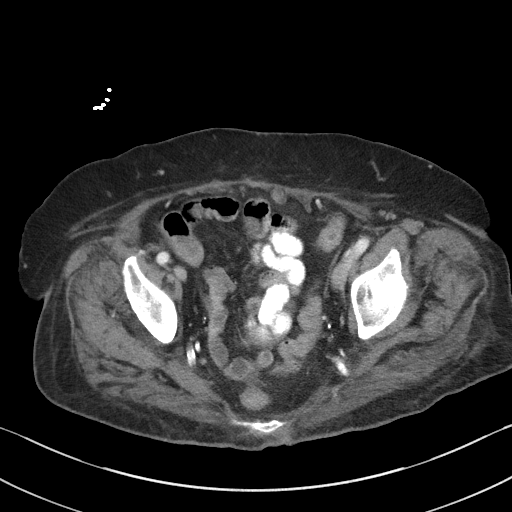
[im 26/93  soft-tissue]
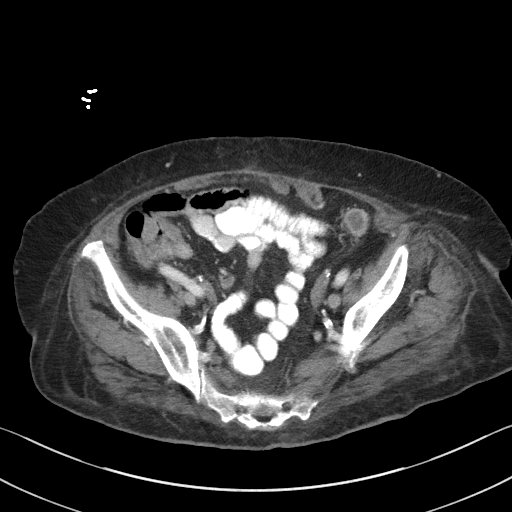
[im 31/93  soft-tissue]
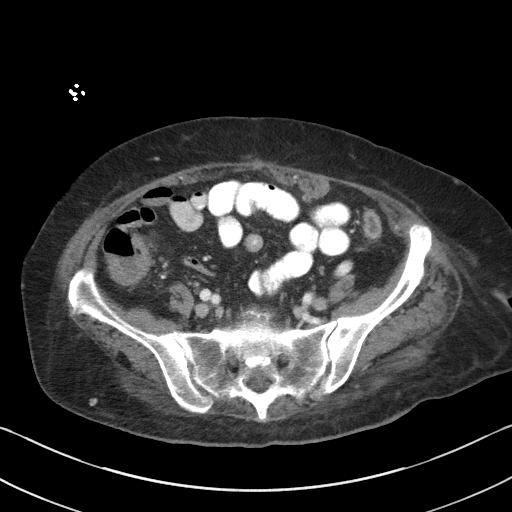
[im 41/93  soft-tissue]
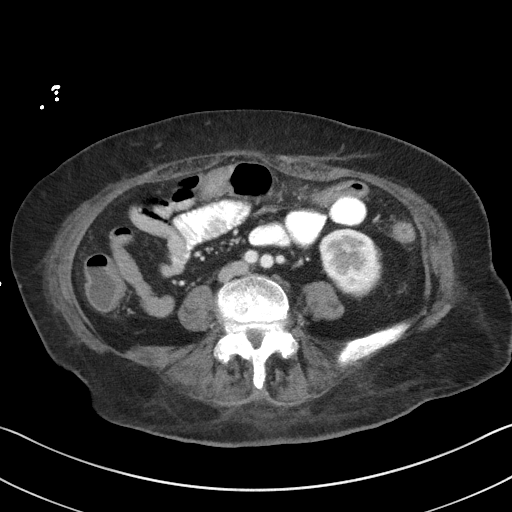
[im 47/93  soft-tissue]
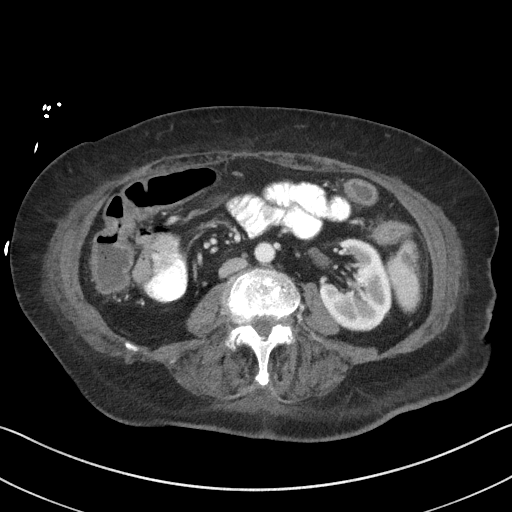
[im 52/93  soft-tissue]
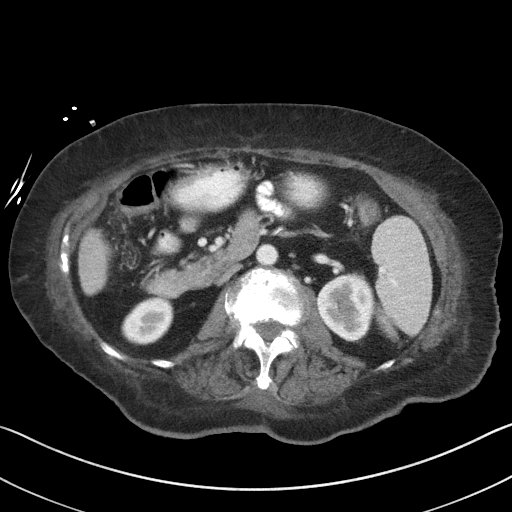
[im 62/93  soft-tissue]
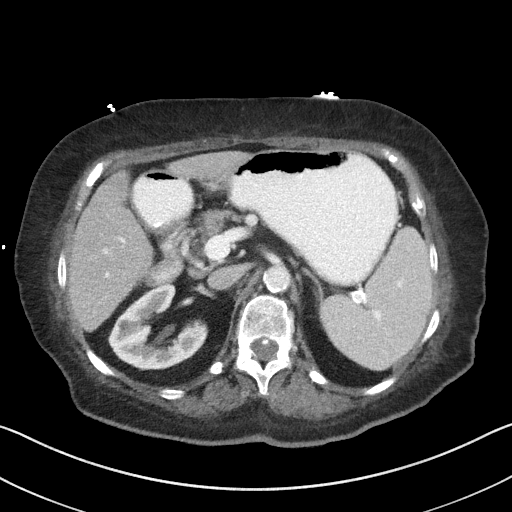
[im 62/93  bone]
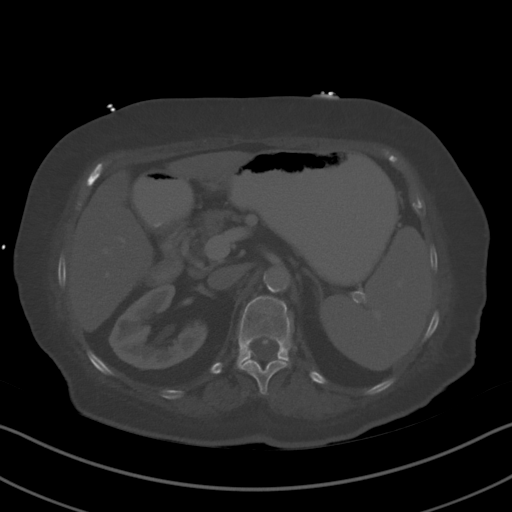
[im 67/93  soft-tissue]
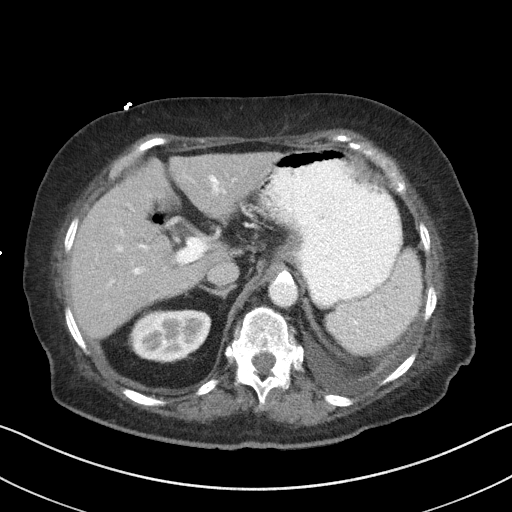
[im 72/93  soft-tissue]
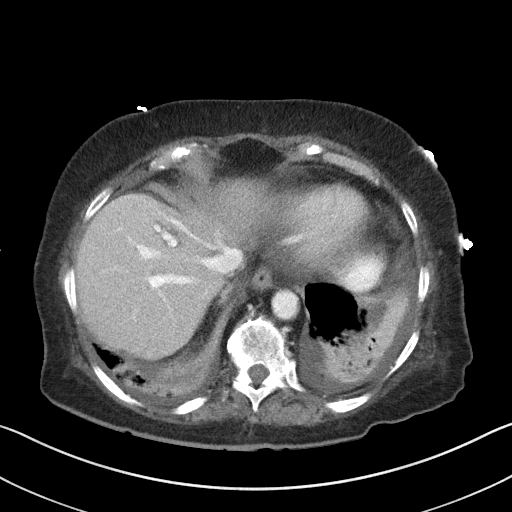
[im 82/93  soft-tissue]
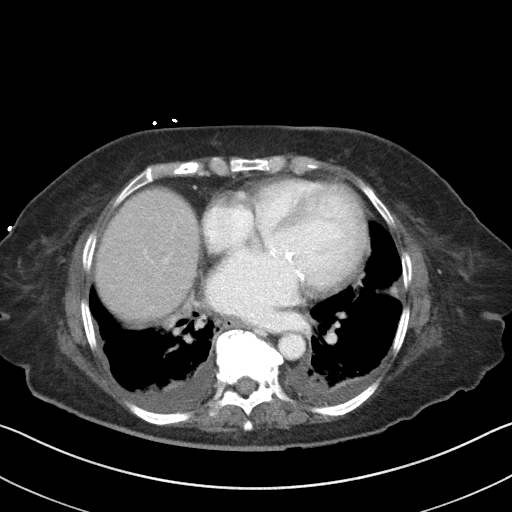
[im 87/93  soft-tissue]
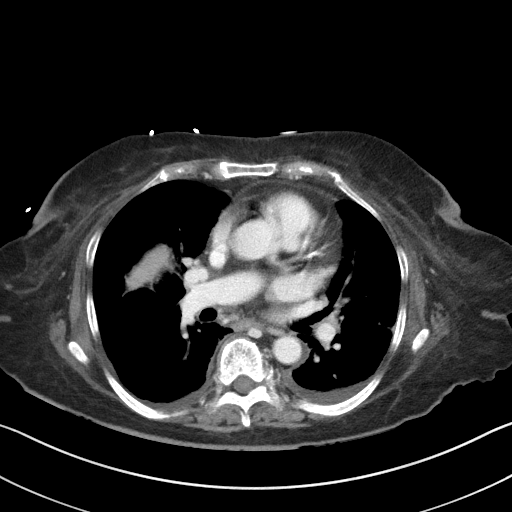

[Series 5: coronal st · coronal · 0.68mm/px · 3 of 74 slices shown]
[im 25/74  soft-tissue]
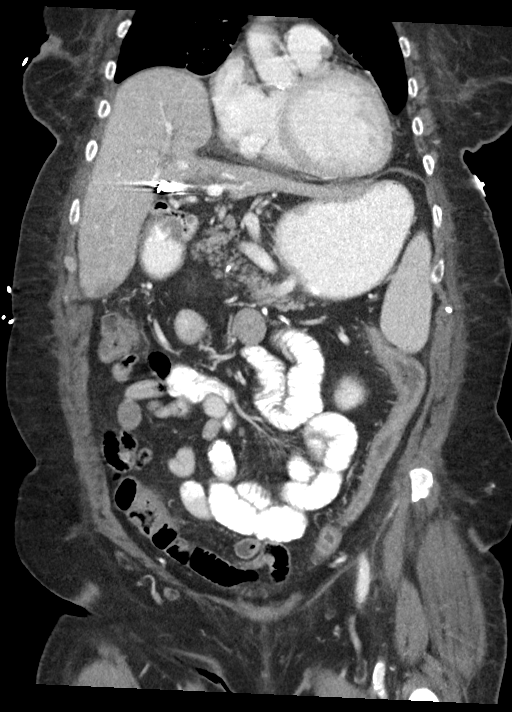
[im 33/74  soft-tissue]
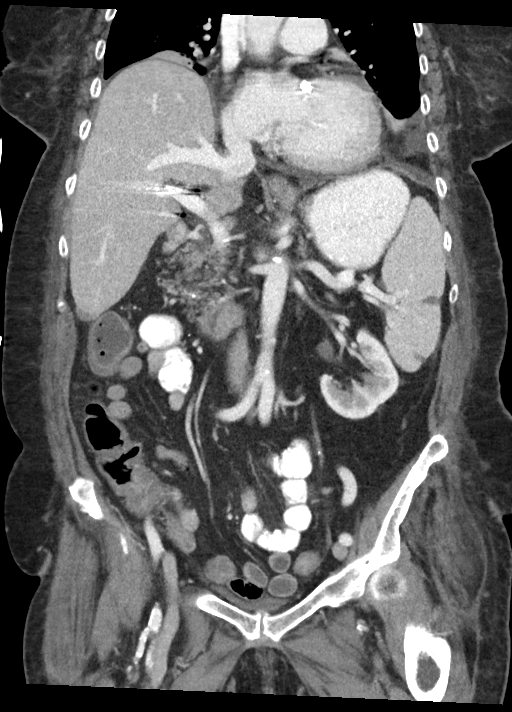
[im 41/74  soft-tissue]
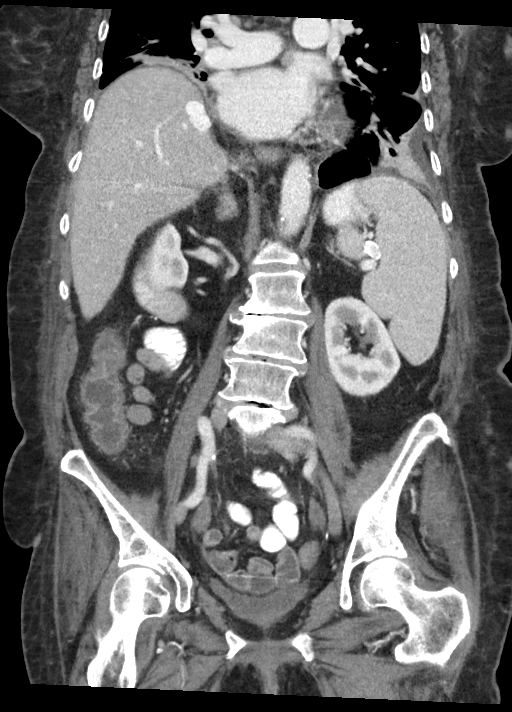

[16 of 46 positions shown; findings below may reference images not displayed]

FINDINGS: Lower chest: Mild bilateral pleural effusion is noted with adjacent
subsegmental atelectasis.

Hepatobiliary: Stable enhancing abnormality seen in dome of right
hepatic lobe most consistent with benign etiology. Status post
cholecystectomy. No biliary dilatation.

Pancreas: Unremarkable. No pancreatic ductal dilatation or
surrounding inflammatory changes.

Spleen: Normal in size without focal abnormality.

Adrenals/Urinary Tract: Adrenal glands are unremarkable. Kidneys are
normal, without renal calculi, focal lesion, or hydronephrosis.
Bladder is unremarkable.

Stomach/Bowel: The stomach is unremarkable. The appendix is not
visualized. Colonic wall thickening noted on prior exam has
resolved. There is no definite evidence of bowel obstruction or
inflammation.

Vascular/Lymphatic: Aortic atherosclerosis. No enlarged abdominal or
pelvic lymph nodes.

Reproductive: Status post hysterectomy. No adnexal masses.

Other: Mild fat containing periumbilical hernia is noted. No
abnormal fluid collection is noted.

Musculoskeletal: No acute or significant osseous findings.
IMPRESSION: Mild bilateral pleural effusions with adjacent subsegmental
atelectasis.

Aortic atherosclerosis.

Mild fat containing periumbilical hernia.

No other significant abnormality seen in the abdomen or pelvis.

## 2019-01-21 IMAGING — CR DG ABDOMEN 1V
1 series · 1 of 1 positions shown · non-contrast
Comparison: None.

CLINICAL DATA: Vomiting

EXAM:
ABDOMEN - 1 VIEW

[abdomen kub]
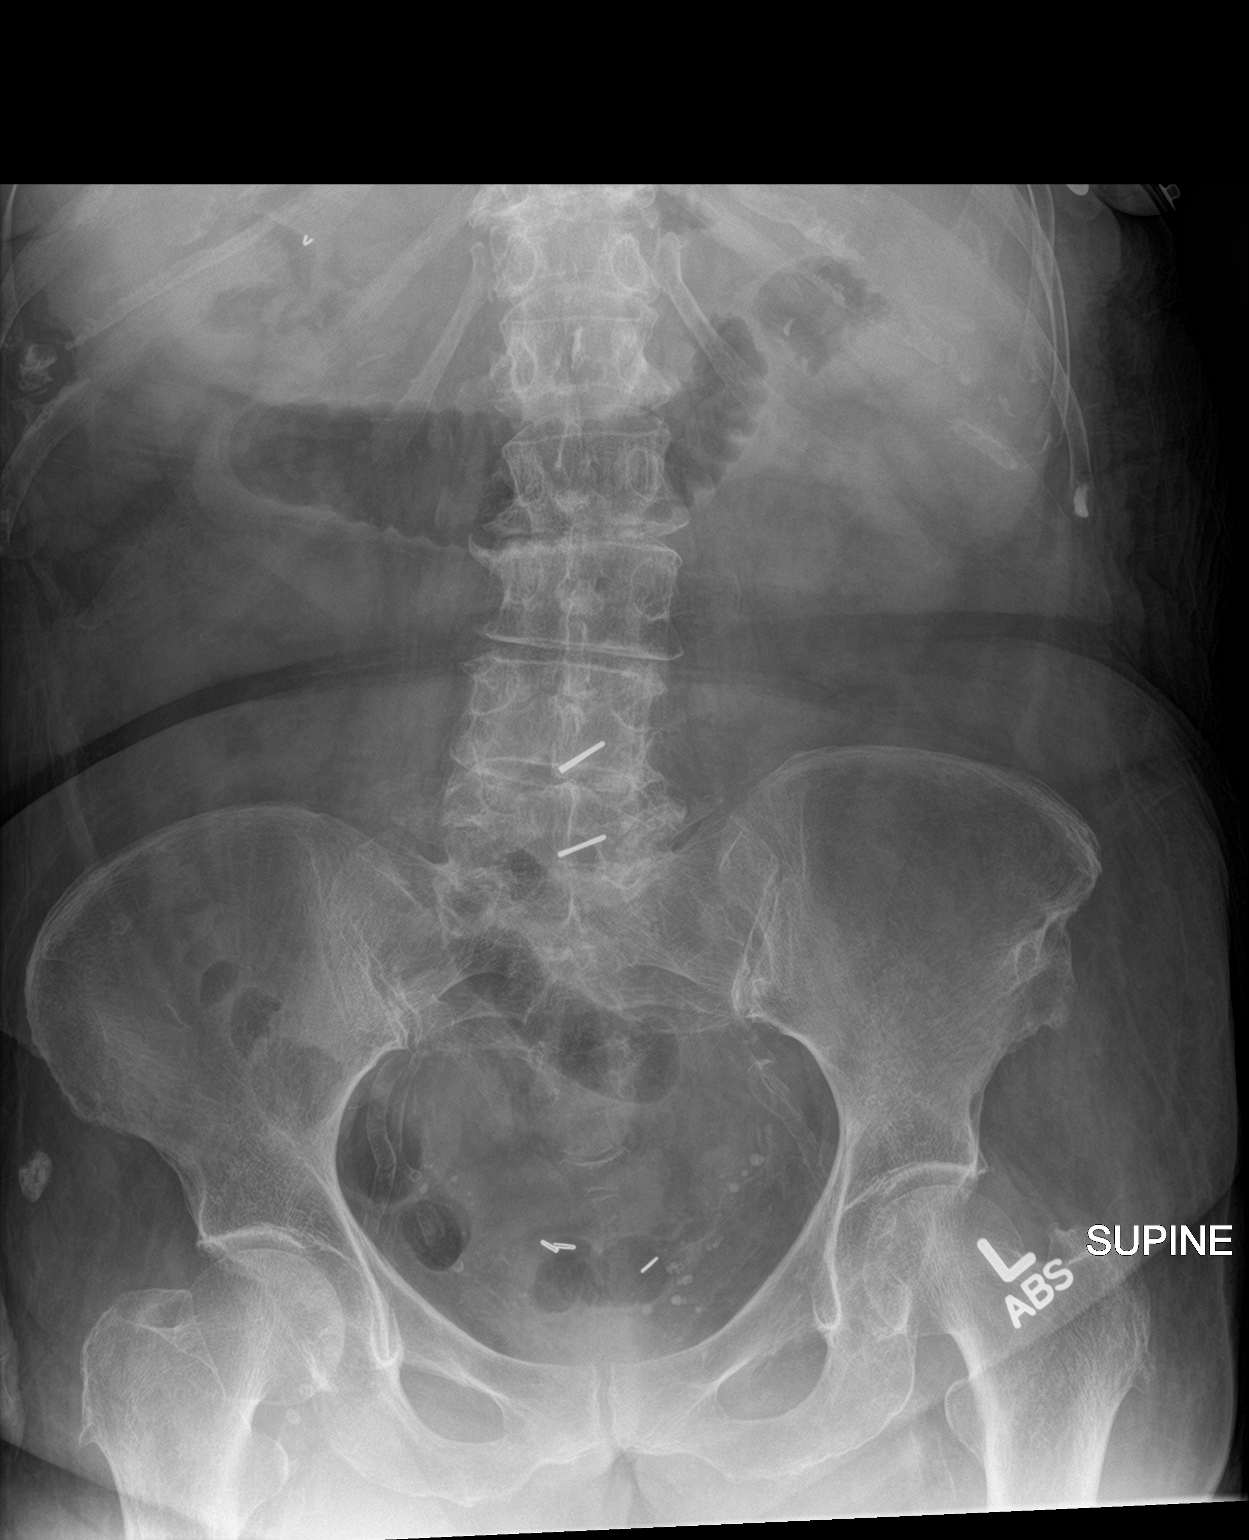

[1 of 1 positions shown; findings below may reference images not displayed]

FINDINGS: There is a loop of small bowel in the mid abdomen which is slightly
distended. No other bowel dilatation is evident. No air-fluid
levels. No free air.

There are surgical clips in the gallbladder as well as in the lower
abdomen. There appears to be air in the common bile duct.

There is calcification in the common and external iliac arteries as
well as phleboliths in the pelvis.
IMPRESSION: 1. Loop of localized dilated small bowel. Suspect a degree of
enteritis or ileus. Bowel obstruction not felt to be likely.

2. Gallbladder absent. Air in the common bile duct raises question
of previous sphincterotomy. If patient has not had previous
sphincterotomy, this finding potentially could be indicative of
perforated upper abdominal ulcer. Appropriate clinical history
essential in this regard.

## 2019-01-23 ENCOUNTER — Ambulatory Visit (INDEPENDENT_AMBULATORY_CARE_PROVIDER_SITE_OTHER): Payer: Medicare Other | Admitting: *Deleted

## 2019-01-23 DIAGNOSIS — I442 Atrioventricular block, complete: Secondary | ICD-10-CM | POA: Diagnosis not present

## 2019-01-30 ENCOUNTER — Encounter: Payer: Self-pay | Admitting: Cardiology

## 2019-01-30 NOTE — Progress Notes (Signed)
Remote pacemaker transmission.   

## 2019-02-03 ENCOUNTER — Telehealth: Payer: Self-pay | Admitting: Internal Medicine

## 2019-02-03 NOTE — Telephone Encounter (Signed)
Called to schedule patient - needed per [facility] for manual PPM testing and reprogramming Spoke with patient and was told to call her [place] center to schedule Called [phone] - no answer, no VM Will try again at a later time

## 2019-02-06 NOTE — Telephone Encounter (Signed)
lmov to schedule  °

## 2019-02-08 ENCOUNTER — Encounter: Payer: Self-pay | Admitting: Nurse Practitioner

## 2019-02-08 ENCOUNTER — Non-Acute Institutional Stay: Payer: Medicare Other | Admitting: Nurse Practitioner

## 2019-02-08 VITALS — BP 109/54 | HR 100 | Temp 98.0°F | Resp 20 | Ht 60.0 in | Wt 138.7 lb

## 2019-02-08 DIAGNOSIS — R531 Weakness: Secondary | ICD-10-CM

## 2019-02-08 DIAGNOSIS — Z515 Encounter for palliative care: Secondary | ICD-10-CM

## 2019-02-08 DIAGNOSIS — R0602 Shortness of breath: Secondary | ICD-10-CM

## 2019-02-08 NOTE — Progress Notes (Signed)
[facility] Consult Note Telephone: [phone]  Fax: [phone]  [name] DOB: Apr 03, 1946 MRN: [id number]  PRIMARY CARE PROVIDER:   Donato Schultz, MD  REFERRING PROVIDER:  Dr [facility] RESPONSIBLE PARTY:   Self  1.Palliative care encounter Z51.5; Palliative medicine team will continue to support patient, patient's family, and medical team. Visit consisted of counseling and education dealing with the complex and emotionally intense issues of symptom management and palliative care in the setting of serious and potentially life-threatening illness  Medical goals consists DNR, do not intubate. Wishes are for more conservative care to include antibiotics, IV fluids, blood transfusions, diagnostic testing, lab testing. Wishes are to minimize hospitalizations if necessary.  2.Dyspneic R06.00 secondary to congestive heart failure remain stable at present time. Continue daily weights and outpatient Cardiology appointments  3.Generalized weaknessR53.1 secondary to CHFcontinue with therapy as able. Encourage energy conservation and rest times.  ASSESSMENT:     I visited and observe Meghan [name]. We talked about purpose for palliative care follow-up visit and she was in agreement. We talked about how she was feeling today and she verbalize that she currently is having pain. She rates her pain 2/10. Reviewed with nursing pain regiment. We talked about her day. Some days are more difficult than others. We talked about mobility and encourage her to get out of bed. Challenges with social isolation in the light of covet pandemic. Medical goals are to focus on Comfort. DNR is already in place. We talked about her appetite which is declined. We talked about residing at skilled long-term care facility with decrease [place]. She was carpeted with assessment. Meghan [name] endorses that she wanted to rest now but an  agreement for a follow-up palliative care visit in 4 weeks if needed or sooner should she declined. Discuss with nursing staff pain regimen and will continue as she is not maximized doses and she does get relief. Therapeutic listening and emotional support provided. Questions answered to satisfaction.  BMI 27.1 4 / 4 / 2020 weight 152.1 lbs 5 / 2 / 2020 weight 145.9 lbs 6 / 4 / 2020 weight 149.5 lbs 6 / 27 / 2020 weight 138.7 lbs  I spent 45 minutes providing this consultation,  from 2:15pm to 3:00pm. More than 50% of the time in this consultation was spent coordinating communication.   HISTORY OF PRESENT ILLNESS:  Meghan [name] is a 73 y.o. year old female with multiple medical problems including COPD, congestive heart failure with ef 20%, asthma, collagen vascular disease, coronary artery disease, heart murmur, benign brain tumor, hypertension, gerd, hypercholesterolemia, history of headache, diabetes, gerd, arthritis, osteoporosis, lumbar degenerative disc disease, anxiety, PTSD, depression, kyphoplasty, right hand reconstruction 2011, cholecystectomy, cervical fusion, abdominal hysterectomy. Meghan. [name] continues to reside in [place] at [facility]. She is ambulatory in the room when she wants to be, toilets herself. Other than going to the bathroom staff endorses is typically she stays in bed. She does perform adl's and at times requires assistance from staff. She feeds herself after tray set up an appetite has been Fair. She has been having intermittent pain in her leg for what she does take an extra Oxycodone. Typically she takes 15 mg / 24 hours. She has Oxycodone scheduled which is 10 mg a day and then She has oxycodone scheduled twice a day but normally she does require a third dose per staff. Therapy has been  working with her for weight-bearing and she continues to be slow to progress. She is able to verbalize her needs. No recent wounds,  hospitalizations, infections. Last seen primary provider visit 5 / 26 / 2024 follow-up of fracture of lower end of right femur 4 closed fracture in routine healing. A Doppler was completed on 5/25 / 2020 to rule out DVT with no evidence of venous clot. She was hospitalized 5 / 13 / 2020 to 5/16 / 2020 for fracture on the same leg after a fall s/p ORIF. She has been working with PT as able. She is followed at the facility by Psychotherapy and Psychiatry. Last psychiatric visit 6 / 18 / 2020 for bipolar disorder with mostly depressive mood, PTSD and anxiety. She is prescribed Lexapro with Lamictal for mood, BuSpar and Klonopin for anxiety. Remeron for appetite and mood. Lyrica for migraine headaches. Recommendation to continue psychotherapy. She did have a fall on 01/09/2019. At present she is lying in bed. She appears pale but comfortable. No visitors present. Palliative Care was asked to help to continue to address goals of care.   CODE STATUS: DNR  PPS: 40% HOSPICE ELIGIBILITY/DIAGNOSIS: TBD  PAST MEDICAL HISTORY:  Past Medical History:  Diagnosis Date   Anginal pain ([place])    Anxiety    Arthritis    RA   Asthma    Brain tumor (benign) (HCC)    CHF (congestive heart failure) (HCC)    Collagen vascular disease (HCC)    COPD (chronic obstructive pulmonary disease) (HCC)    Coronary artery disease    DDD (degenerative disc disease)    Depression    Diabetes mellitus    GERD (gastroesophageal reflux disease)    Headache    Heart murmur    Heart murmur    Hypercholesteremia    Hypertension    Lumbar degenerative disc disease    Migraines    Obesity    Osteopenia    Osteoporosis    Pneumonia    PTSD (post-traumatic stress disorder)     SOCIAL HX:  Social History   Tobacco Use   Smoking status: Never Smoker   Smokeless tobacco: Never Used  Substance Use Topics   Alcohol use: No    Alcohol/week: 0.0 standard drinks    ALLERGIES:  Allergies  Allergen  Reactions   Amoxicillin Itching and Other (See Comments)    Has patient had a PCN reaction causing immediate rash, facial/tongue/throat swelling, SOB or lightheadedness with hypotension: Unknown Has patient had a PCN reaction causing severe rash involving mucus membranes or skin necrosis: Unknown Has patient had a PCN reaction that required hospitalization: Unknown Has patient had a PCN reaction occurring within the last 10 years: Unknown If all of the above answers are "NO", then may proceed with Cephalosporin use.    Gabapentin Other (See Comments)    Pt states that it causes her BP to drop.    Naproxen Hives   Nsaids Other (See Comments)    Reaction:  Unknown    Tolmetin Other (See Comments)    Reaction:  Unknown      PERTINENT MEDICATIONS:  Outpatient Encounter Medications as of 02/08/2019  Medication Sig   busPIRone (BUSPAR) 7.5 MG tablet Take 1 tablet by mouth 2 (two) times daily.   clonazePAM (KLONOPIN) 0.5 MG tablet Take 0.125 mg by mouth 2 (two) times daily.   cyanocobalamin 500 MCG tablet Take 500 mcg by mouth daily.   diclofenac sodium (VOLTAREN) 1 % GEL Apply 2 g  topically 3 (three) times daily. Apply to the fingers   fluticasone (VERAMYST) 27.5 MCG/SPRAY nasal spray Place 2 sprays into the nose 2 (two) times daily.   lamoTRIgine (LAMICTAL) 100 MG tablet Take 1 tablet (100 mg total) by mouth daily.   loperamide (IMODIUM A-D) 2 MG tablet Take 1 tablet (2 mg total) by mouth 4 (four) times daily as needed for diarrhea or loose stools. (Patient taking differently: Take 2 mg by mouth as needed for diarrhea or loose stools. )   loratadine (CLARITIN) 10 MG tablet Take 10 mg by mouth daily.   mirtazapine (REMERON) 7.5 MG tablet Take 7.5 mg by mouth at bedtime.    omeprazole (PRILOSEC) 20 MG capsule Take 20 mg by mouth daily.    oxyCODONE-acetaminophen (PERCOCET/ROXICET) 5-325 MG tablet Take 1 tablet by mouth every 4 (four) hours as needed for moderate pain or severe  pain.   oxyCODONE-acetaminophen (PERCOCET/ROXICET) 5-325 MG tablet Take 1 tablet by mouth 2 (two) times a day. scheduled   Potassium Chloride ER 20 MEQ TBCR Take 20 mEq by mouth 2 (two) times daily.   pramipexole (MIRAPEX) 0.25 MG tablet Take 0.25 mg by mouth daily.    saxagliptin HCl (ONGLYZA) 5 MG TABS tablet Take 5 mg by mouth at bedtime.   Tofacitinib Citrate 5 MG TABS Take 5 mg by mouth 2 (two) times daily after a meal.    topiramate (TOPAMAX) 25 MG tablet Take 25 mg by mouth at bedtime.    escitalopram (LEXAPRO) 10 MG tablet Take 1 tablet (10 mg total) by mouth every morning. (Patient taking differently: Take 20 mg by mouth every morning. )   [DISCONTINUED] Cholecalciferol 5000 units TABS Take 5,000 Units by mouth every 30 (thirty) days.   [DISCONTINUED] diphenoxylate-atropine (LOMOTIL) 2.5-0.025 MG tablet Take 1 tablet by mouth every 6 (six) hours as needed for diarrhea or loose stools.    [DISCONTINUED] folic acid (FOLVITE) 1 MG tablet Take 1 mg by mouth daily.   [DISCONTINUED] Ipratropium-Albuterol (COMBIVENT RESPIMAT) 20-100 MCG/ACT AERS respimat Inhale 2 puffs into the lungs every 6 (six) hours.   [DISCONTINUED] montelukast (SINGULAIR) 10 MG tablet Take 10 mg by mouth daily.   [DISCONTINUED] predniSONE (DELTASONE) 5 MG tablet Take 1 tablet (5 mg total) by mouth 2 (two) times daily with a meal. (Patient not taking: Reported on 02/08/2019)   No facility-administered encounter medications on file as of 02/08/2019.     PHYSICAL EXAM:   General: NAD, chronically ill, pleasant female Cardiovascular: regular rate and rhythm Pulmonary: clear ant fields Abdomen: soft, nontender, + bowel sounds GU: no suprapubic tenderness Extremities: +BLE  edema, no joint deformities Skin: no rashes Neurological: Weakness but otherwise nonfocal  Tatsuya Okray Ihor Gully, NP

## 2019-02-09 ENCOUNTER — Other Ambulatory Visit: Payer: Self-pay

## 2019-02-09 NOTE — Telephone Encounter (Signed)
Left message reference appointment sooner than 7/21 available at 8 am 7/14 or 7/16.

## 2019-02-13 NOTE — Progress Notes (Signed)
Has appt 7/21

## 2019-02-20 IMAGING — DX DG HAND 2V*L*
3 series · 3 of 3 positions shown · non-contrast
Comparison: No prior hand x-rays.  Left wrist x-rays 09/19/2014.

CLINICAL DATA: 71-year-old with current history of rheumatoid
arthritis to has had multiple falls recently and was found on the
floor of her home earlier today. Left hand pain. Initial encounter.

EXAM:
LEFT HAND - 2 VIEW

[hand ap (1 of 2)]
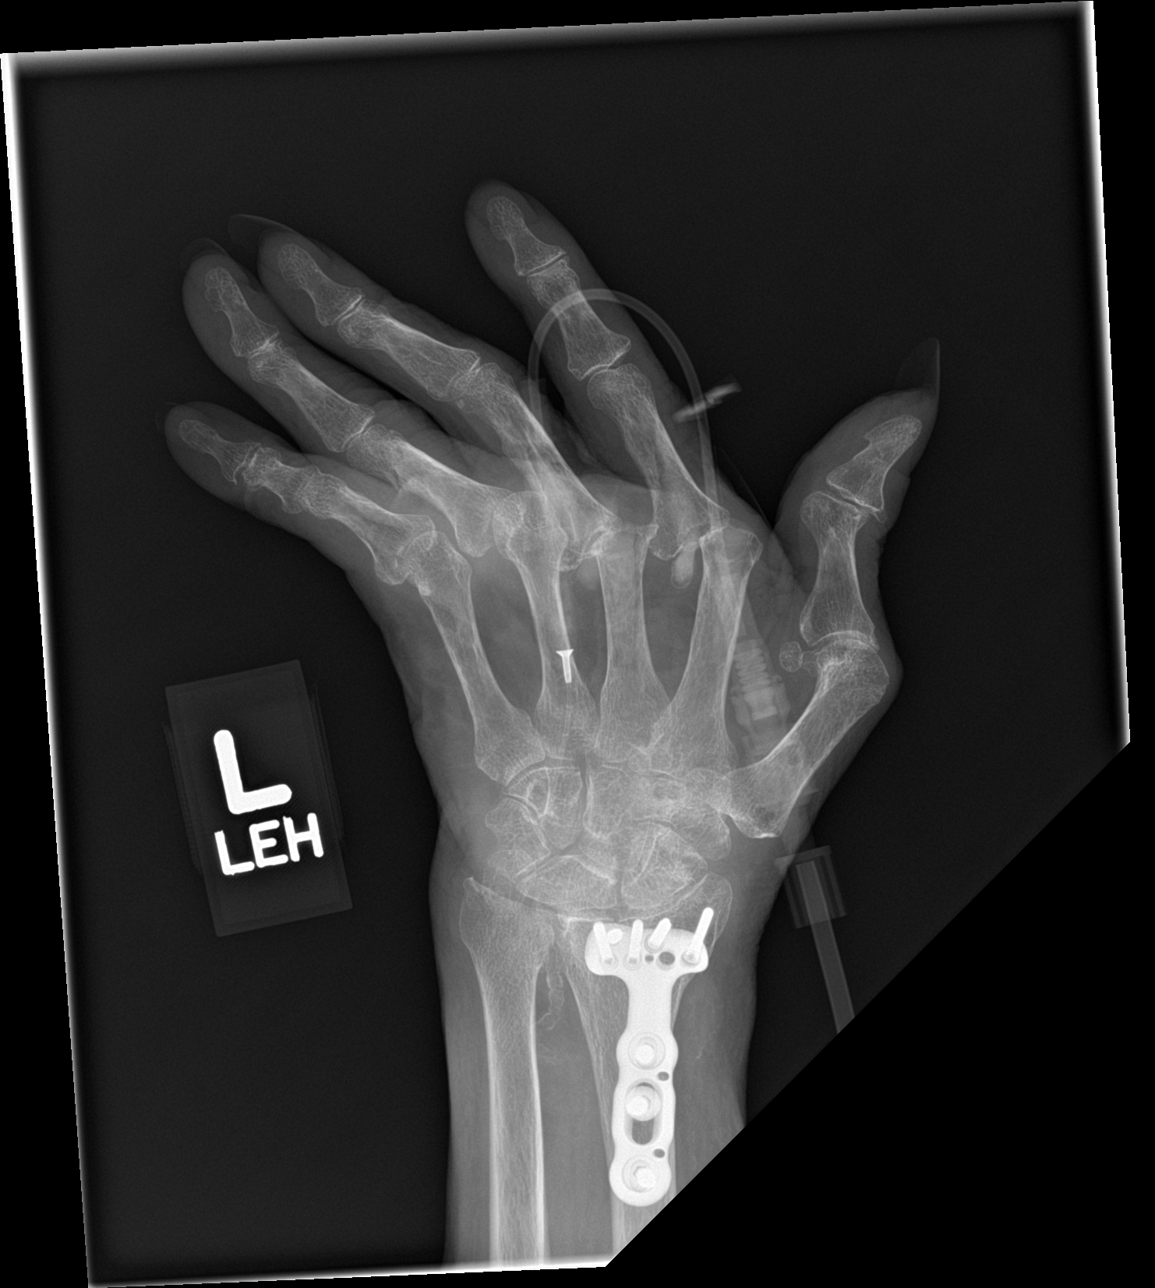

[hand lat]
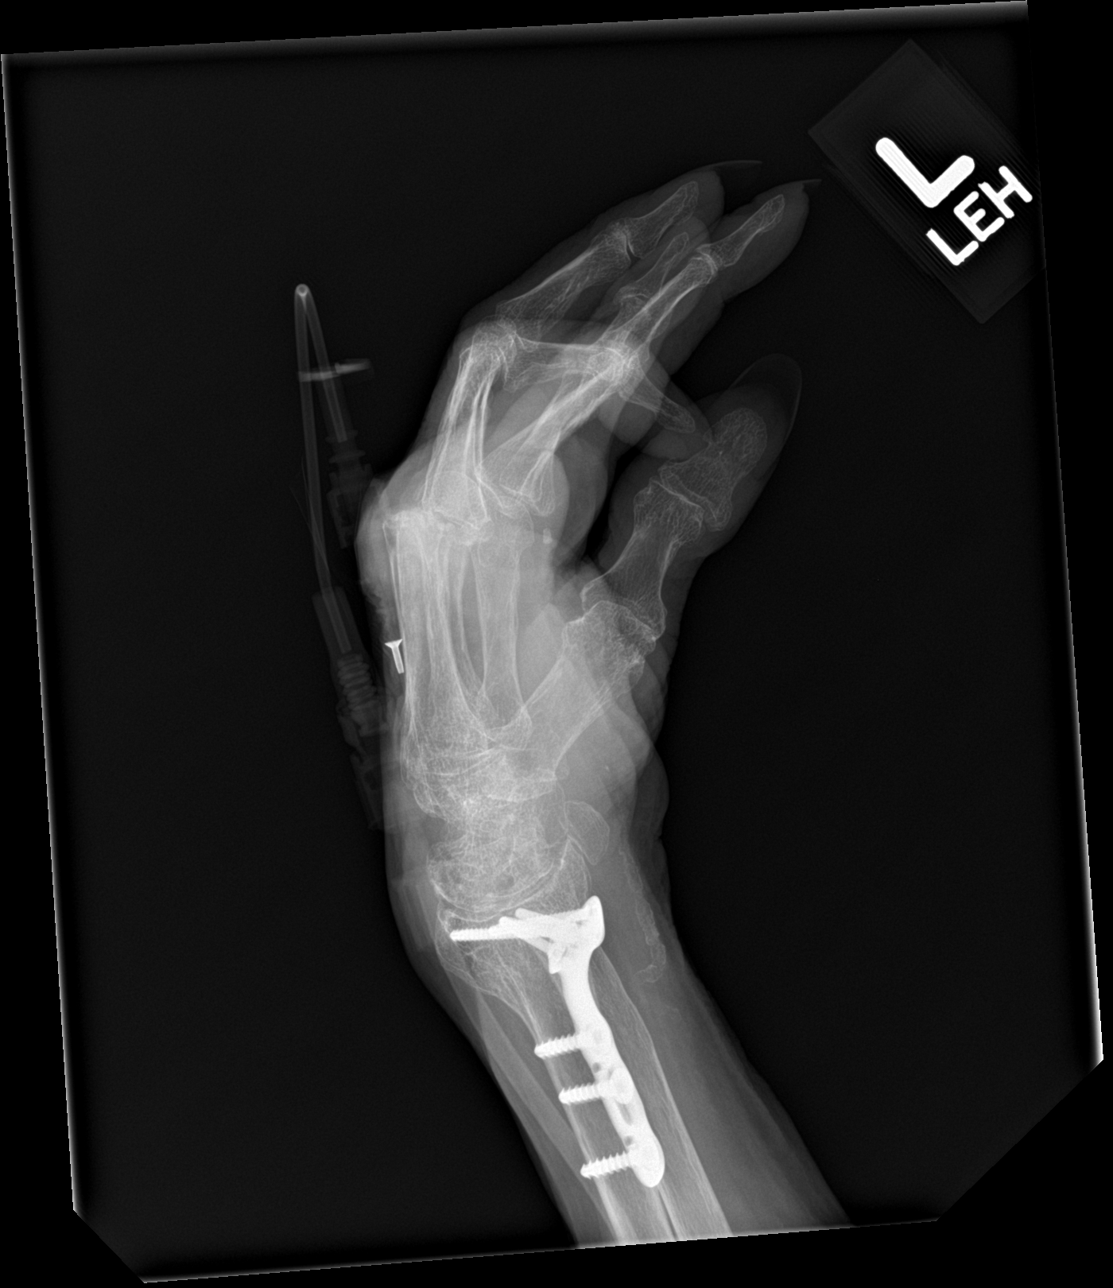

[hand ap (2 of 2)]
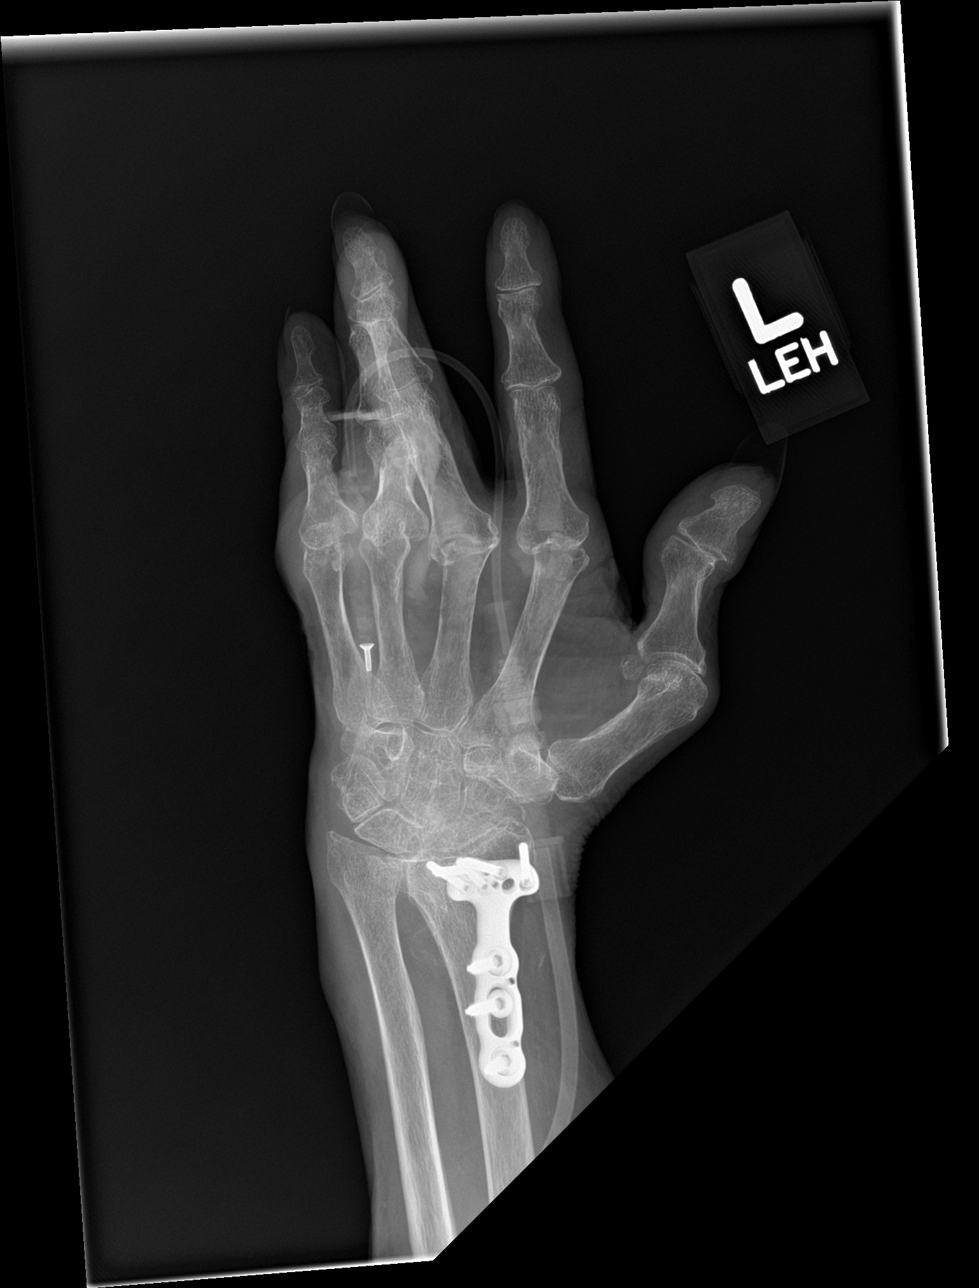

[3 of 3 positions shown; findings below may reference images not displayed]

FINDINGS: No evidence of acute fracture. Severe osseous demineralization.
Severe deformities at all of the MCP joints related to the
rheumatoid arthritis. Prior ORIF of a distal radius fracture with
healing.
IMPRESSION: 1. No acute osseous abnormality.
2. Deformities involving the MCP joints related to chronic
rheumatoid arthritis.
3. Osseous demineralization.

## 2019-02-20 IMAGING — CR DG RIBS W/ CHEST 3+V*R*
1 series · 5 of 5 positions shown · non-contrast
Comparison: 05/10/2017 in previous

CLINICAL DATA: Patient found on the floor. Confusion. Right-sided
pain.

EXAM:
RIGHT RIBS AND CHEST - 3+ VIEW

[Series 1: dg ribs unilateral w/chest right · 0.14mm/px · 5 of 5 slices shown]
[im 1/5]
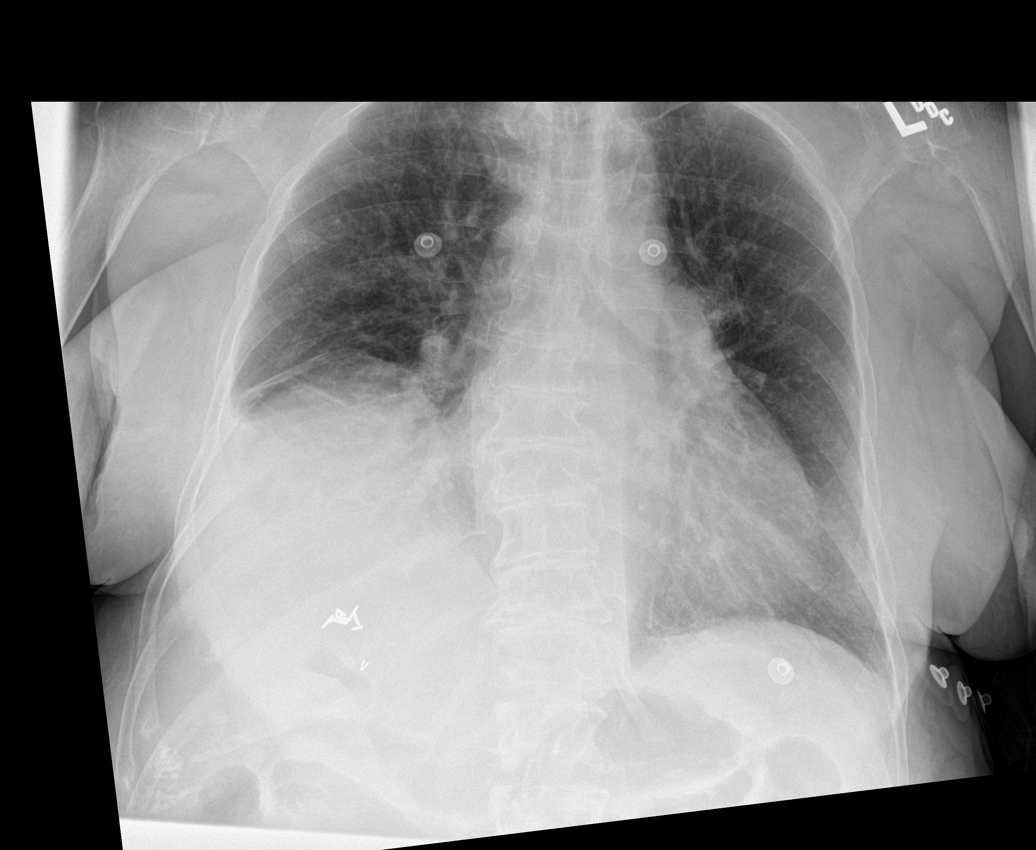
[im 2/5]
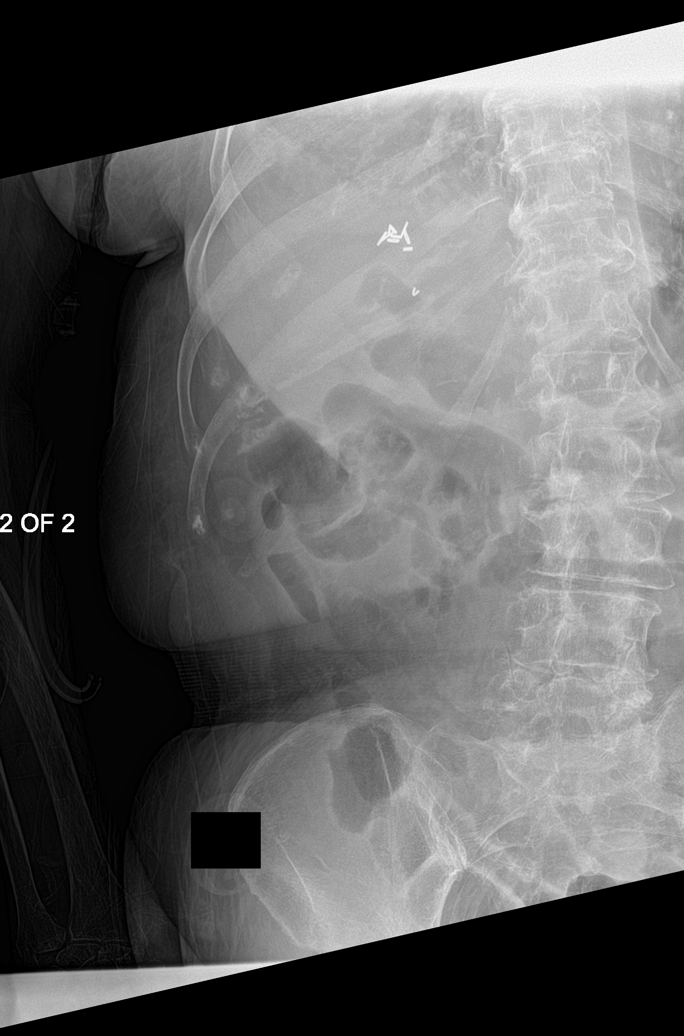
[im 3/5]
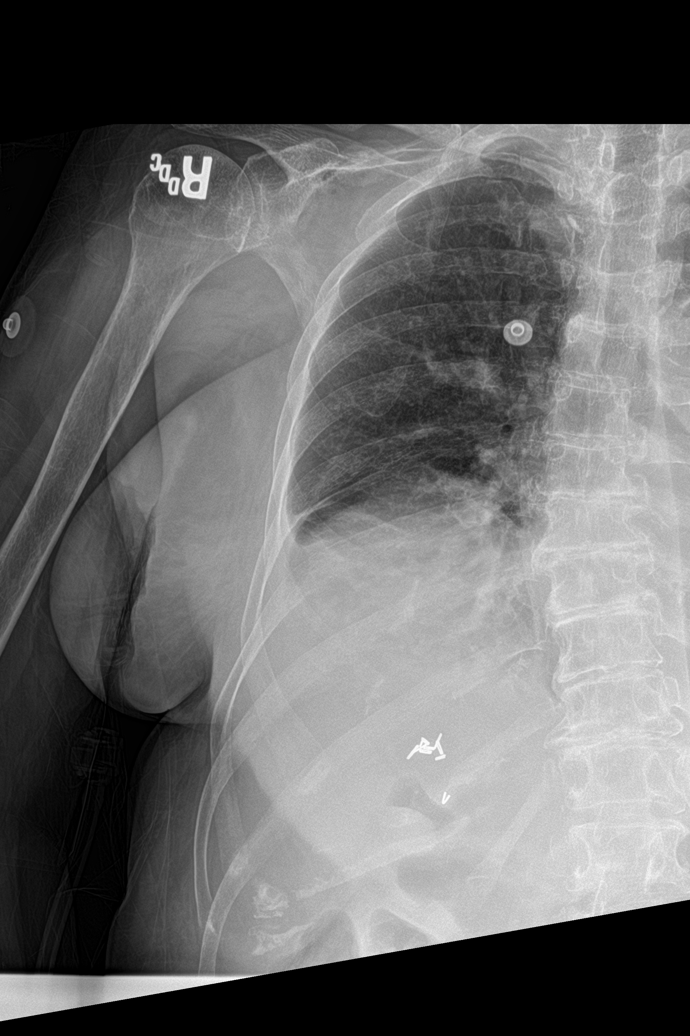
[im 4/5]
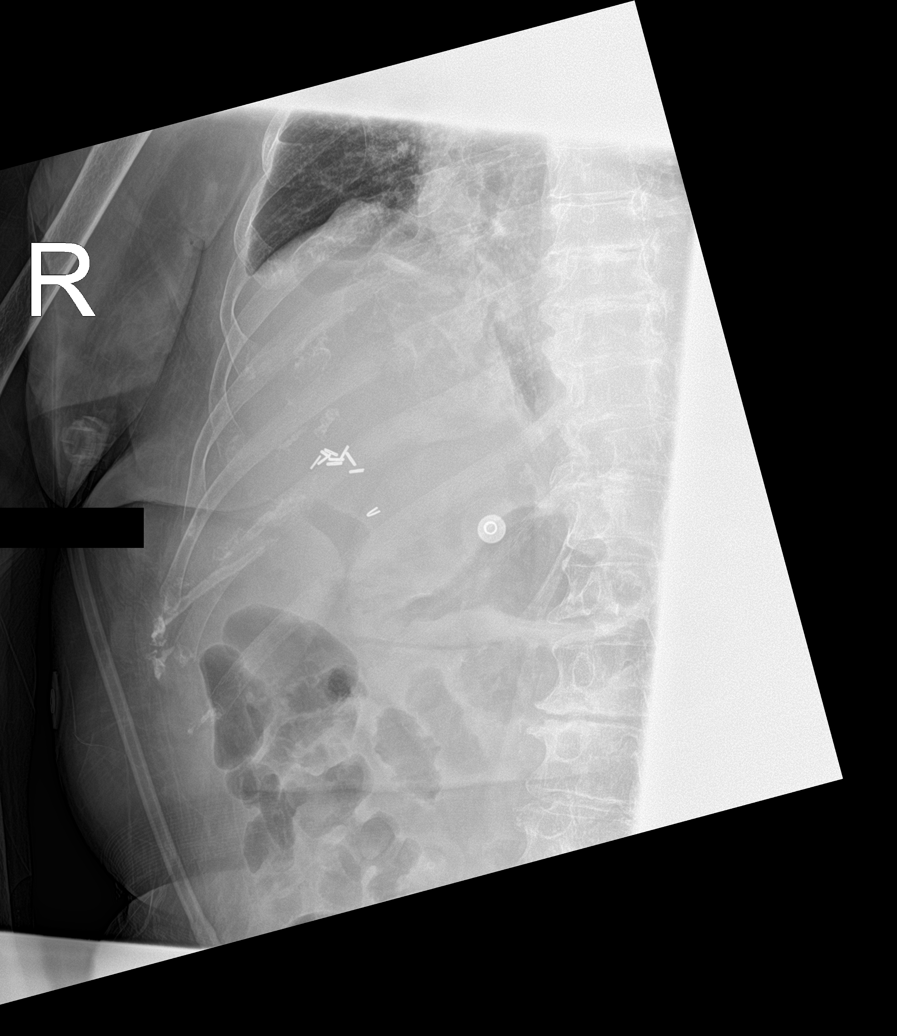
[im 5/5]
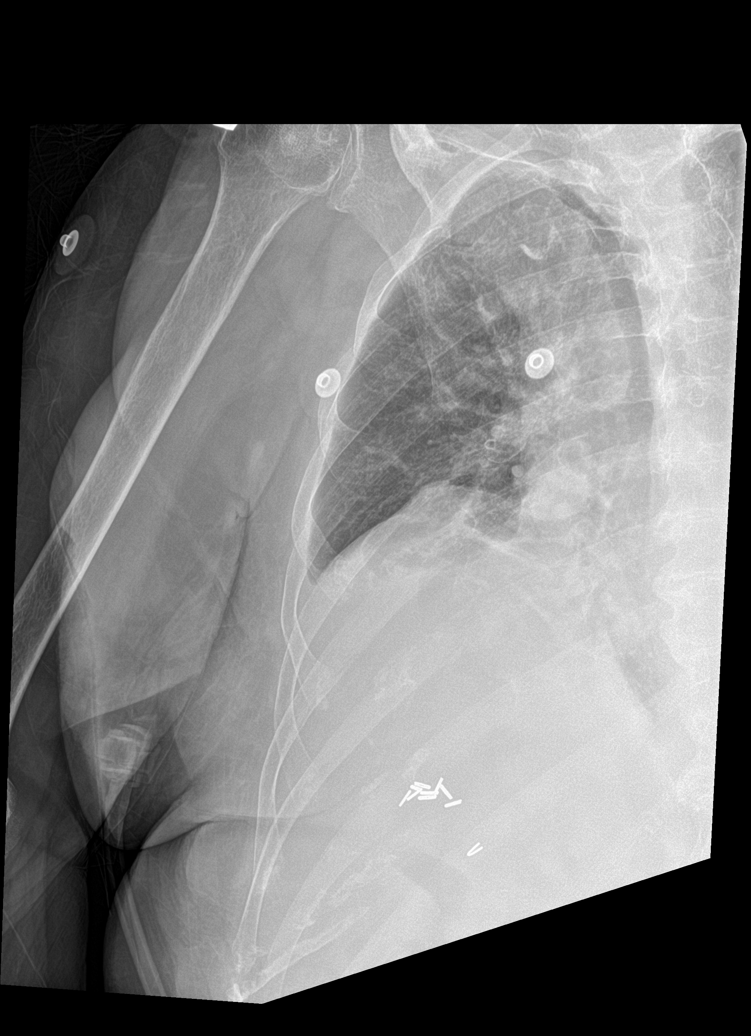

[5 of 5 positions shown; findings below may reference images not displayed]

FINDINGS: Heart size is normal. Mediastinal shadows are normal. Left chest is
clear. There is chronic elevation of the right hemidiaphragm with
some right effusion in volume loss in the right lower lung. Right
lower lung pneumonia not excluded. Rib films on the right do not
show a fracture.
IMPRESSION: No evidence of rib fracture on the right.

Elevated right hemidiaphragm. Small right effusion. Right lower lung
density that could be atelectasis and/or pneumonia.

## 2019-02-20 IMAGING — CT CT HEAD W/O CM
3 series · 16 of 47 positions shown, 19 images · non-contrast
Comparison: CT head dated March 02, 2017.

CLINICAL DATA: Altered mental status.

EXAM:
CT HEAD WITHOUT CONTRAST
TECHNIQUE: Contiguous axial images were obtained from the base of the skull
through the vertex without intravenous contrast.

[Series 3: head wo · axial · 0.43mm/px · z∈[+554,+689]mm · 10 of 33 slices shown, 13 images]
[im 3/33  brain]
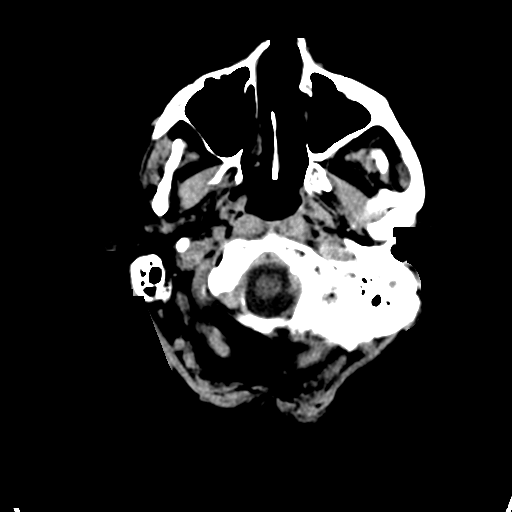
[im 3/33  bone]
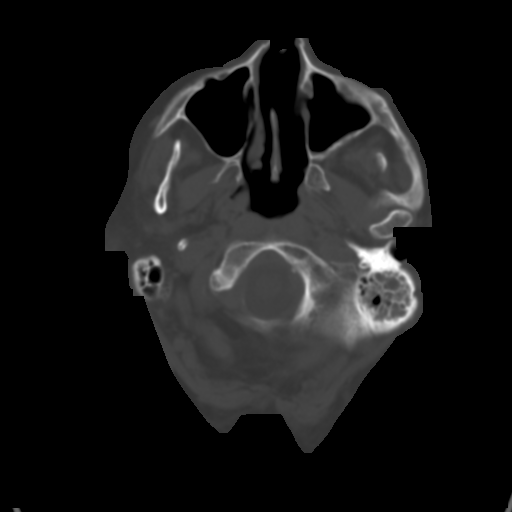
[im 6/33  brain]
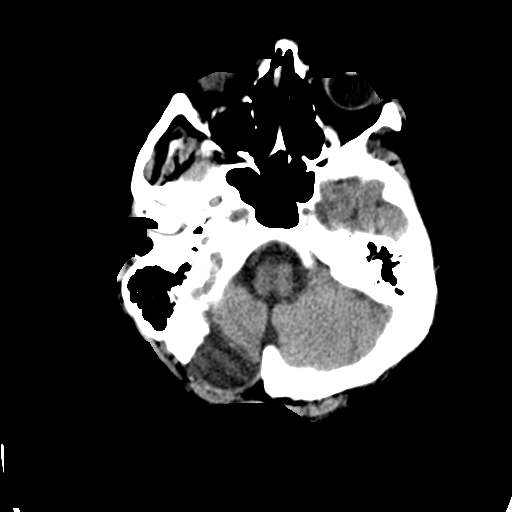
[im 9/33  brain]
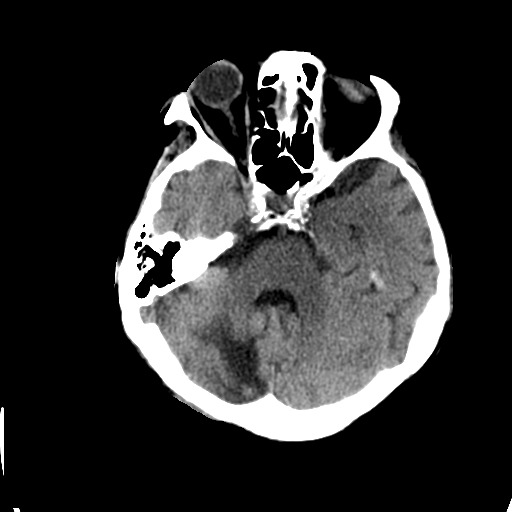
[im 12/33  brain]
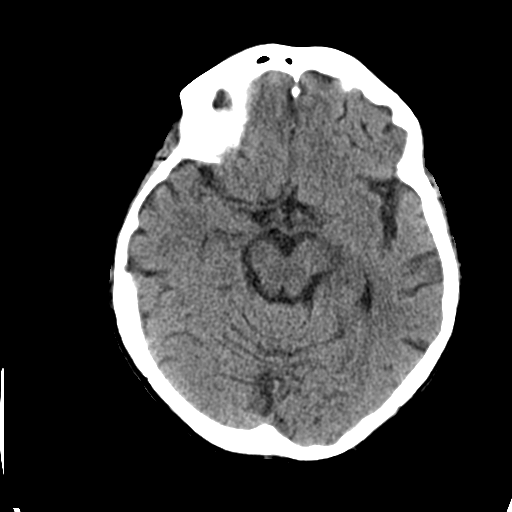
[im 15/33  brain]
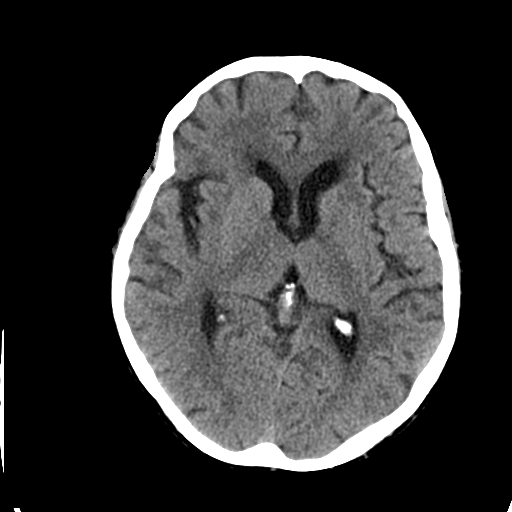
[im 15/33  bone]
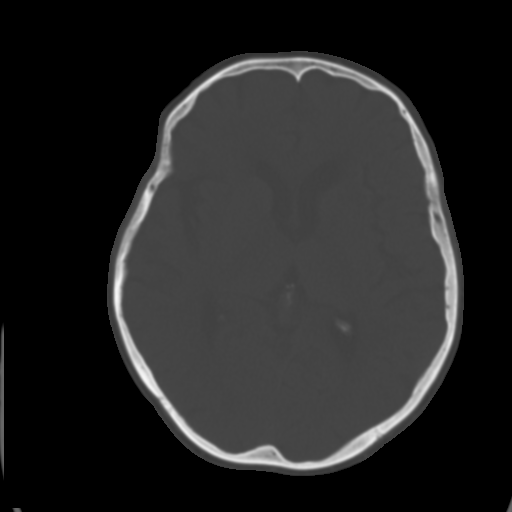
[im 18/33  brain]
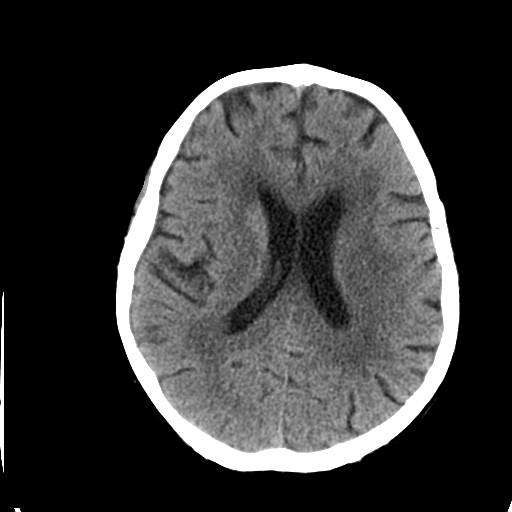
[im 21/33  brain]
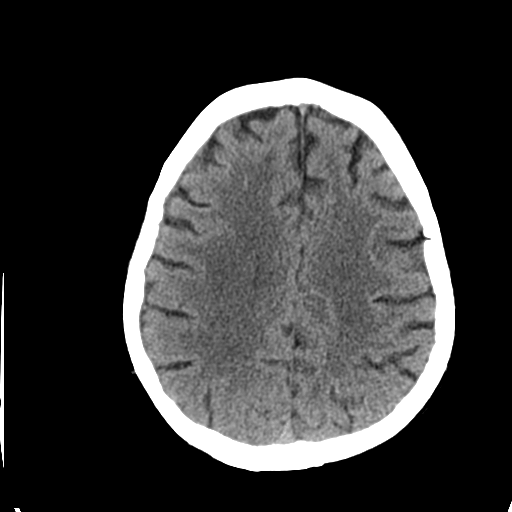
[im 25/33  brain]
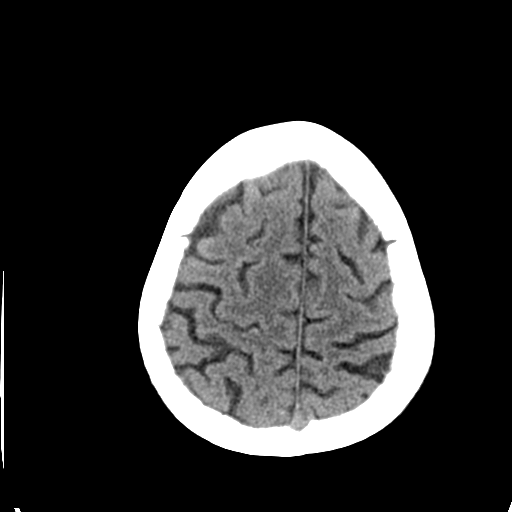
[im 27/33  brain]
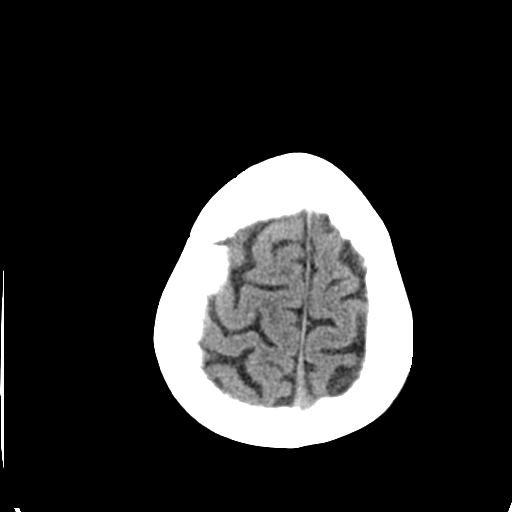
[im 27/33  bone]
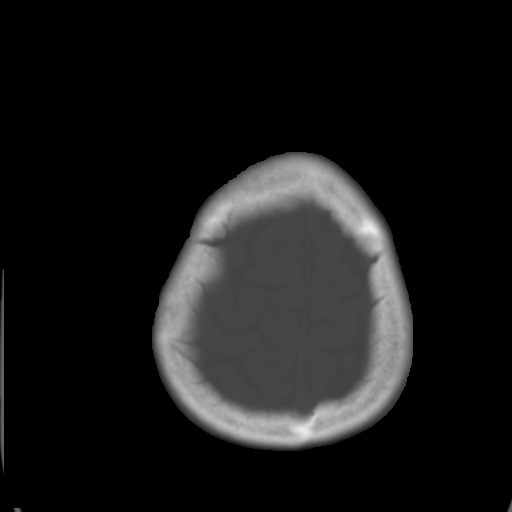
[im 30/33  brain]
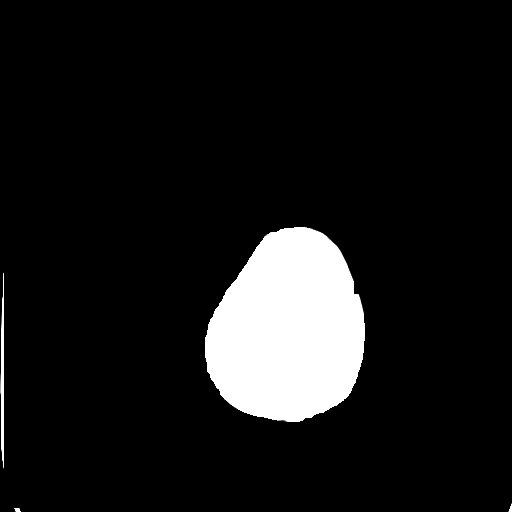

[Series 4: coronal soft tissue · coronal · 0.30mm/px · 3 of 59 slices shown]
[im 20/59  brain]
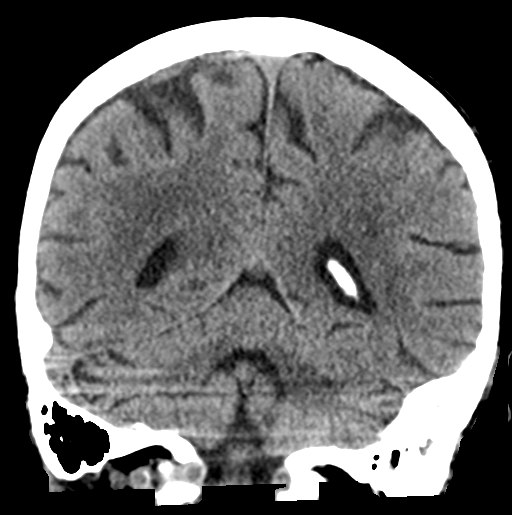
[im 26/59  brain]
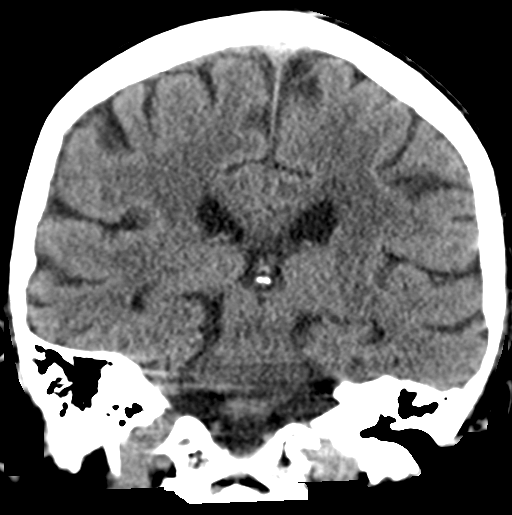
[im 33/59  brain]
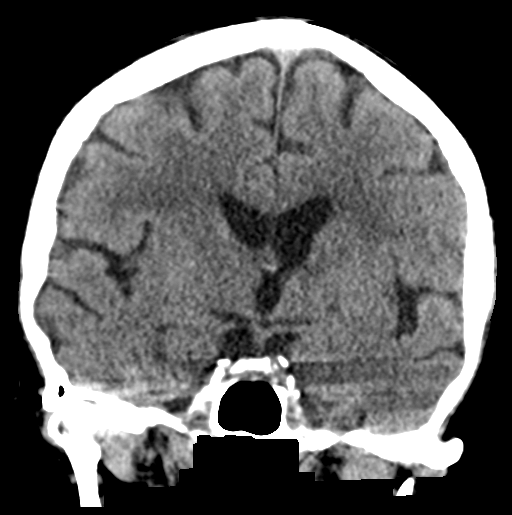

[Series 5: sagittal soft tissue · sagittal · 0.30mm/px · 3 of 50 slices shown]
[im 17/50  brain]
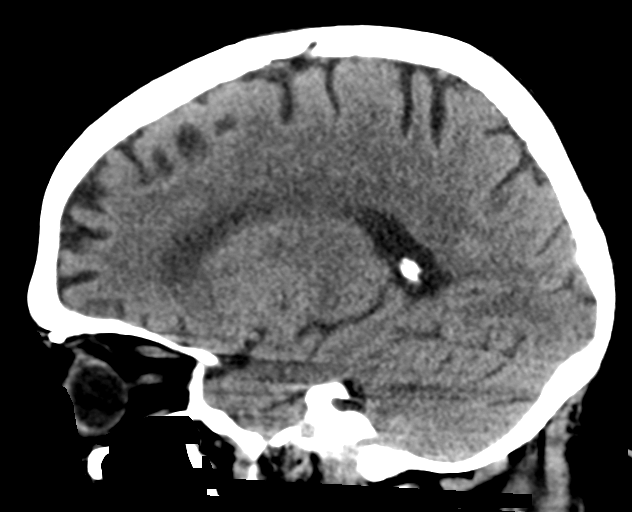
[im 25/50  brain]
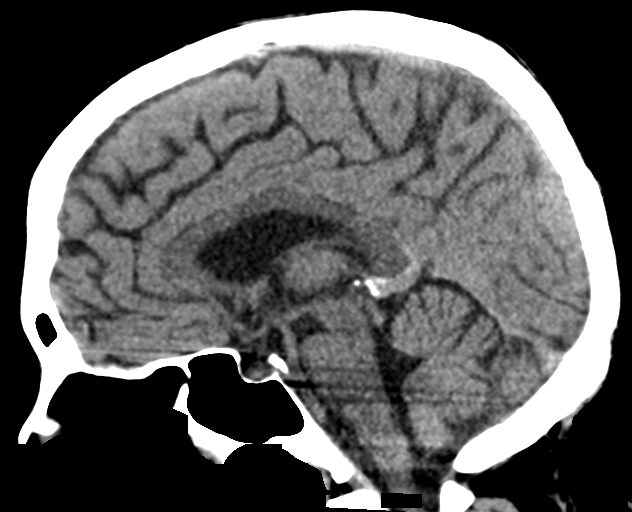
[im 33/50  brain]
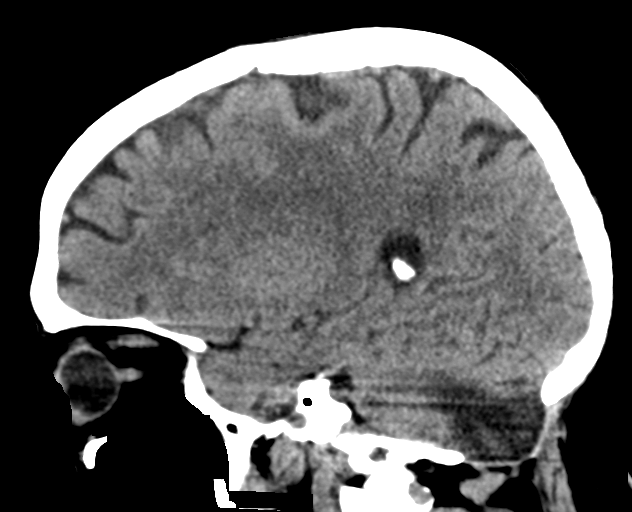

[16 of 47 positions shown; findings below may reference images not displayed]

FINDINGS: Brain: No evidence of acute infarction, hemorrhage, hydrocephalus,
extra-axial collection or mass lesion/mass effect. Stable age
related cerebral atrophy and chronic microvascular ischemic white
matter disease. Unchanged right cerebellar encephalomalacia.

Vascular: Atherosclerotic vascular calcification of the carotid
siphons. No hyperdense vessel.

Skull: Prior right suboccipital craniotomy. Negative for fracture or
focal lesion.

Sinuses/Orbits: Partial opacification of the left mastoid air cells,
improved when compared to prior study. No acute orbital finding.

Other: None.
IMPRESSION: 1. No acute intracranial abnormality. Stable atrophy and chronic
microvascular ischemic white matter disease.
2. Partial opacification of the left mastoid air cells, improved
when compared to prior study.

## 2019-02-20 IMAGING — CR DG HIP (WITH OR WITHOUT PELVIS) 2-3V*R*
1 series · 3 of 3 positions shown · non-contrast
Comparison: None.

CLINICAL DATA: Found on the floor.  Right-sided pain.

EXAM:
DG HIP (WITH OR WITHOUT PELVIS) 2-3V RIGHT

[Series 1: dg hip unilat w or w/o pelvis 2-3 views  · non-contrast · 0.14mm/px · 3 of 3 slices shown]
[im 1/3]
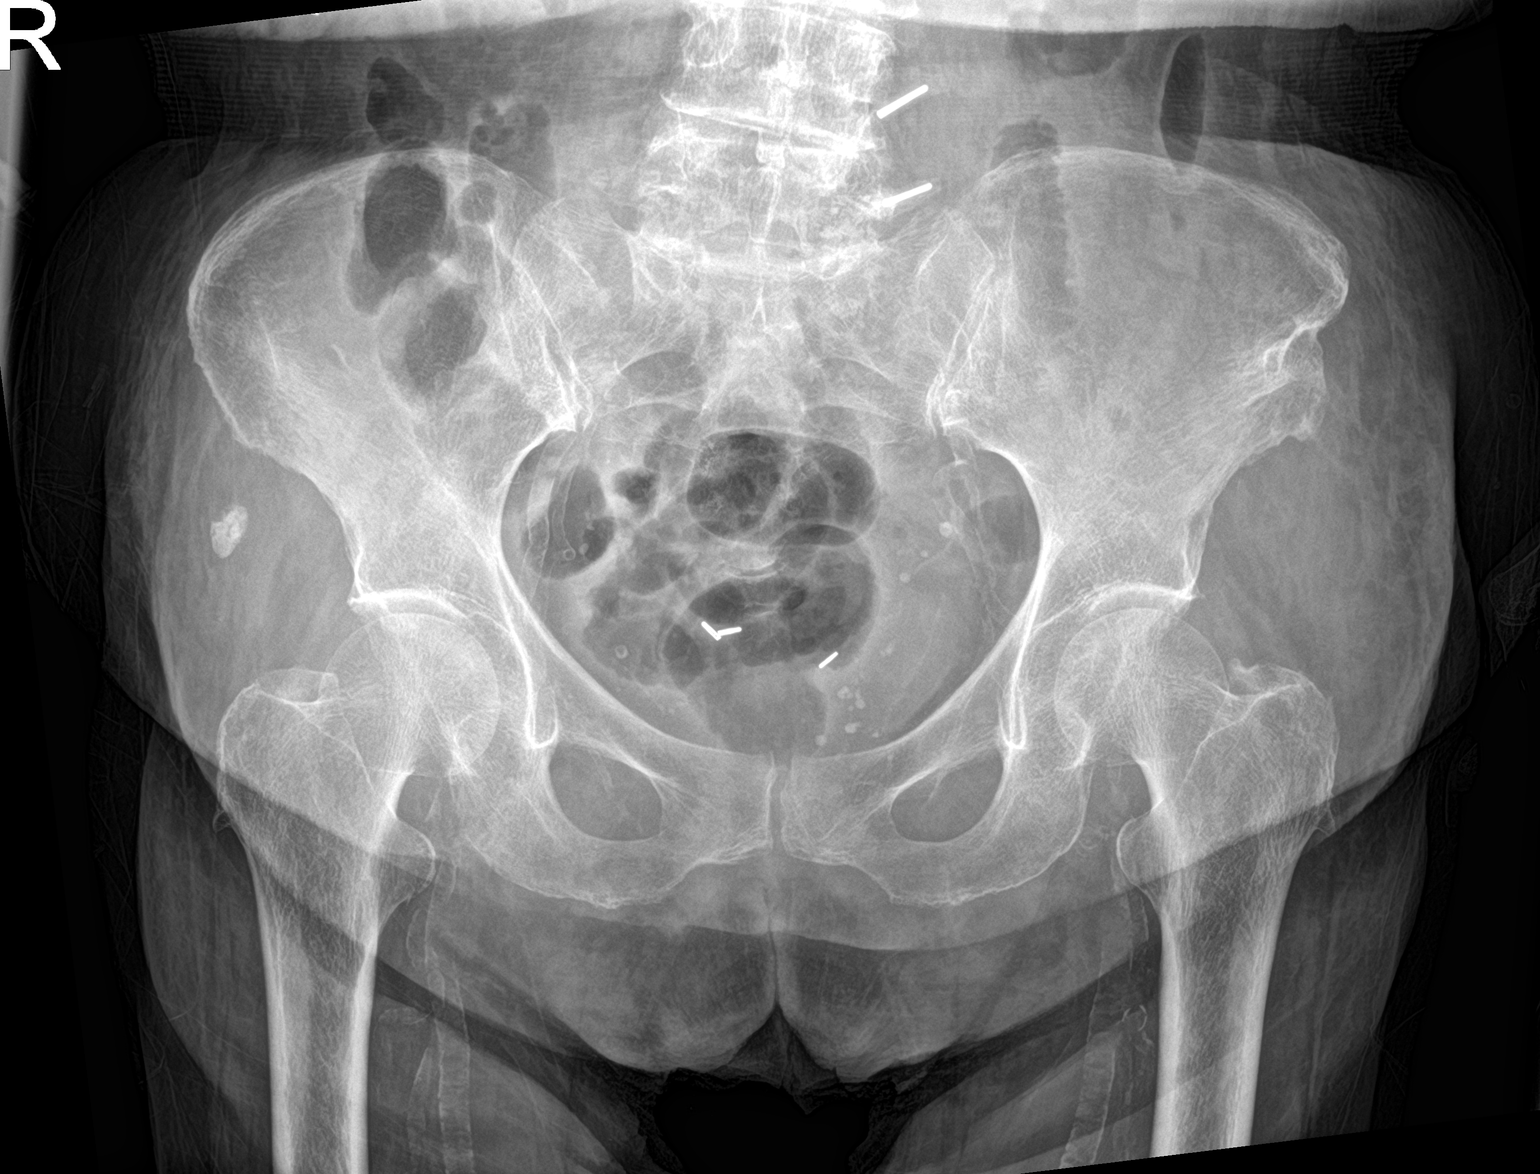
[im 2/3]
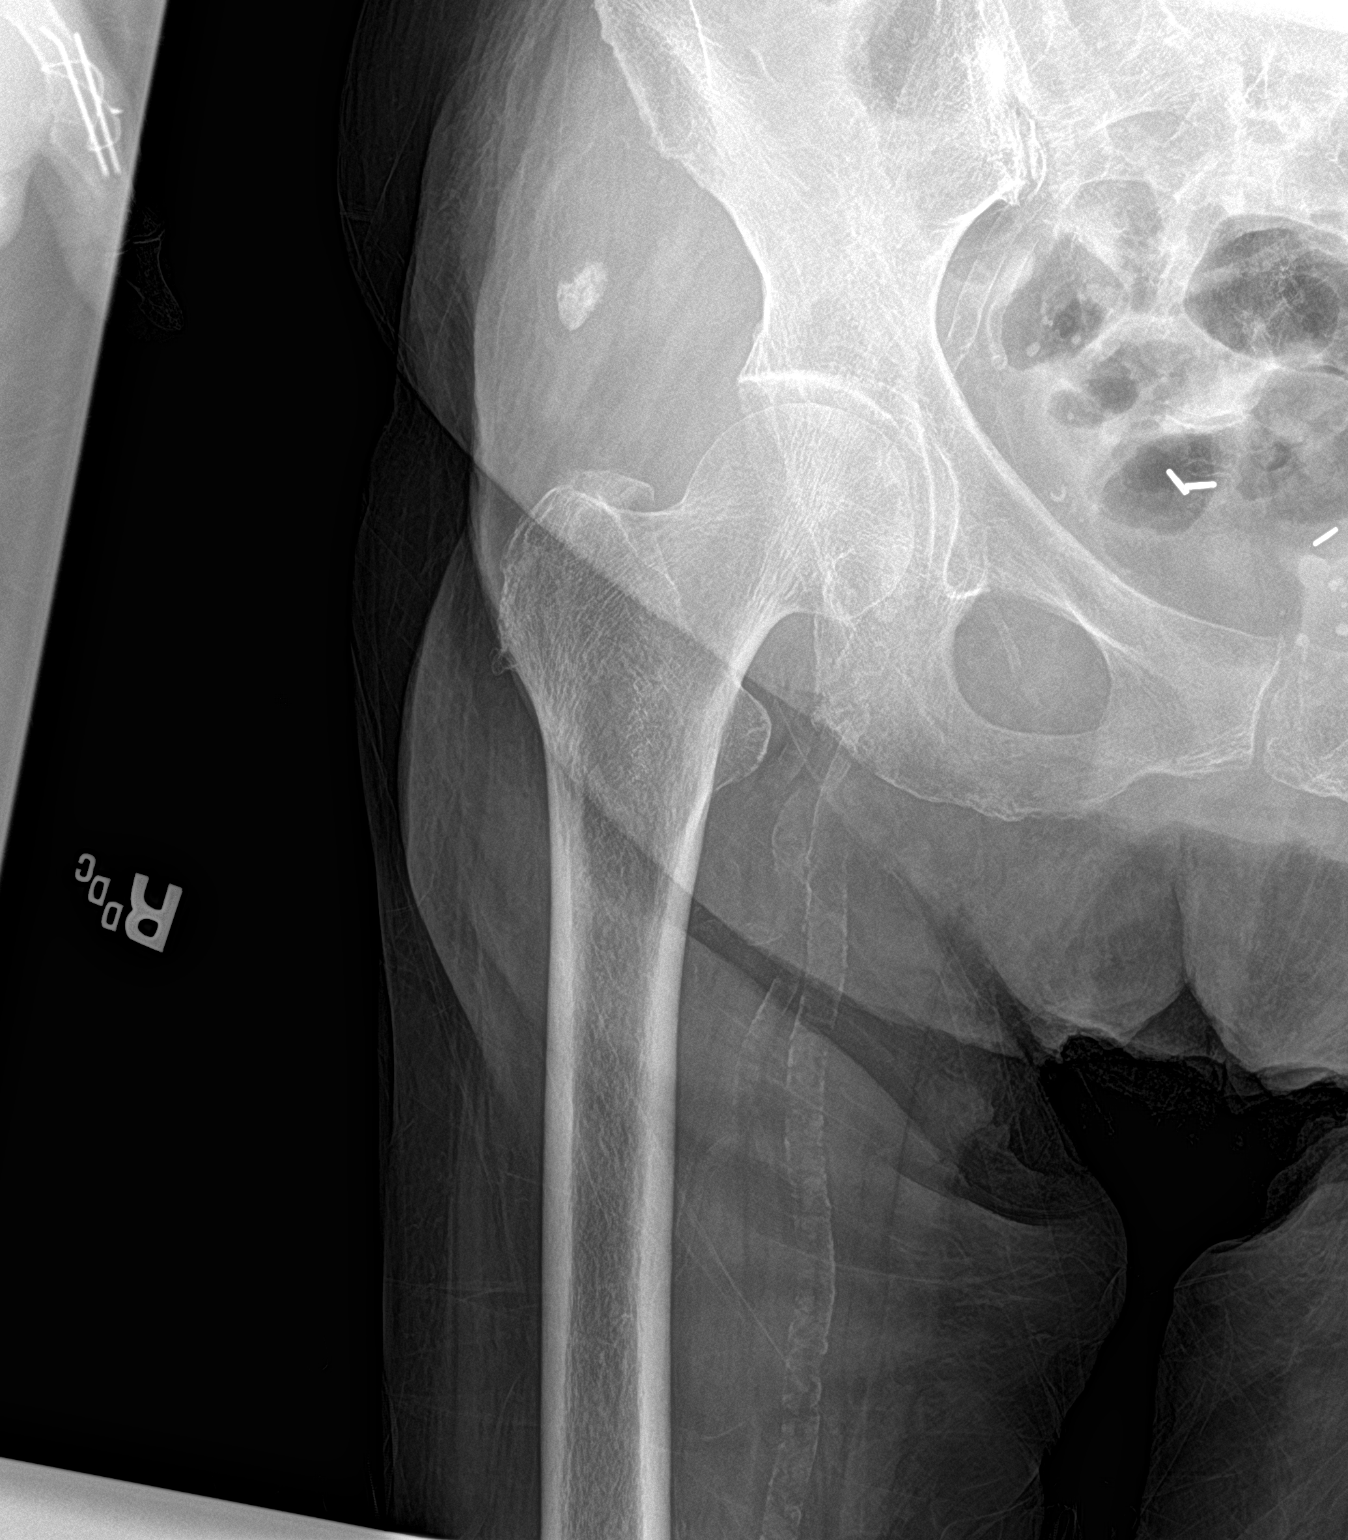
[im 3/3]
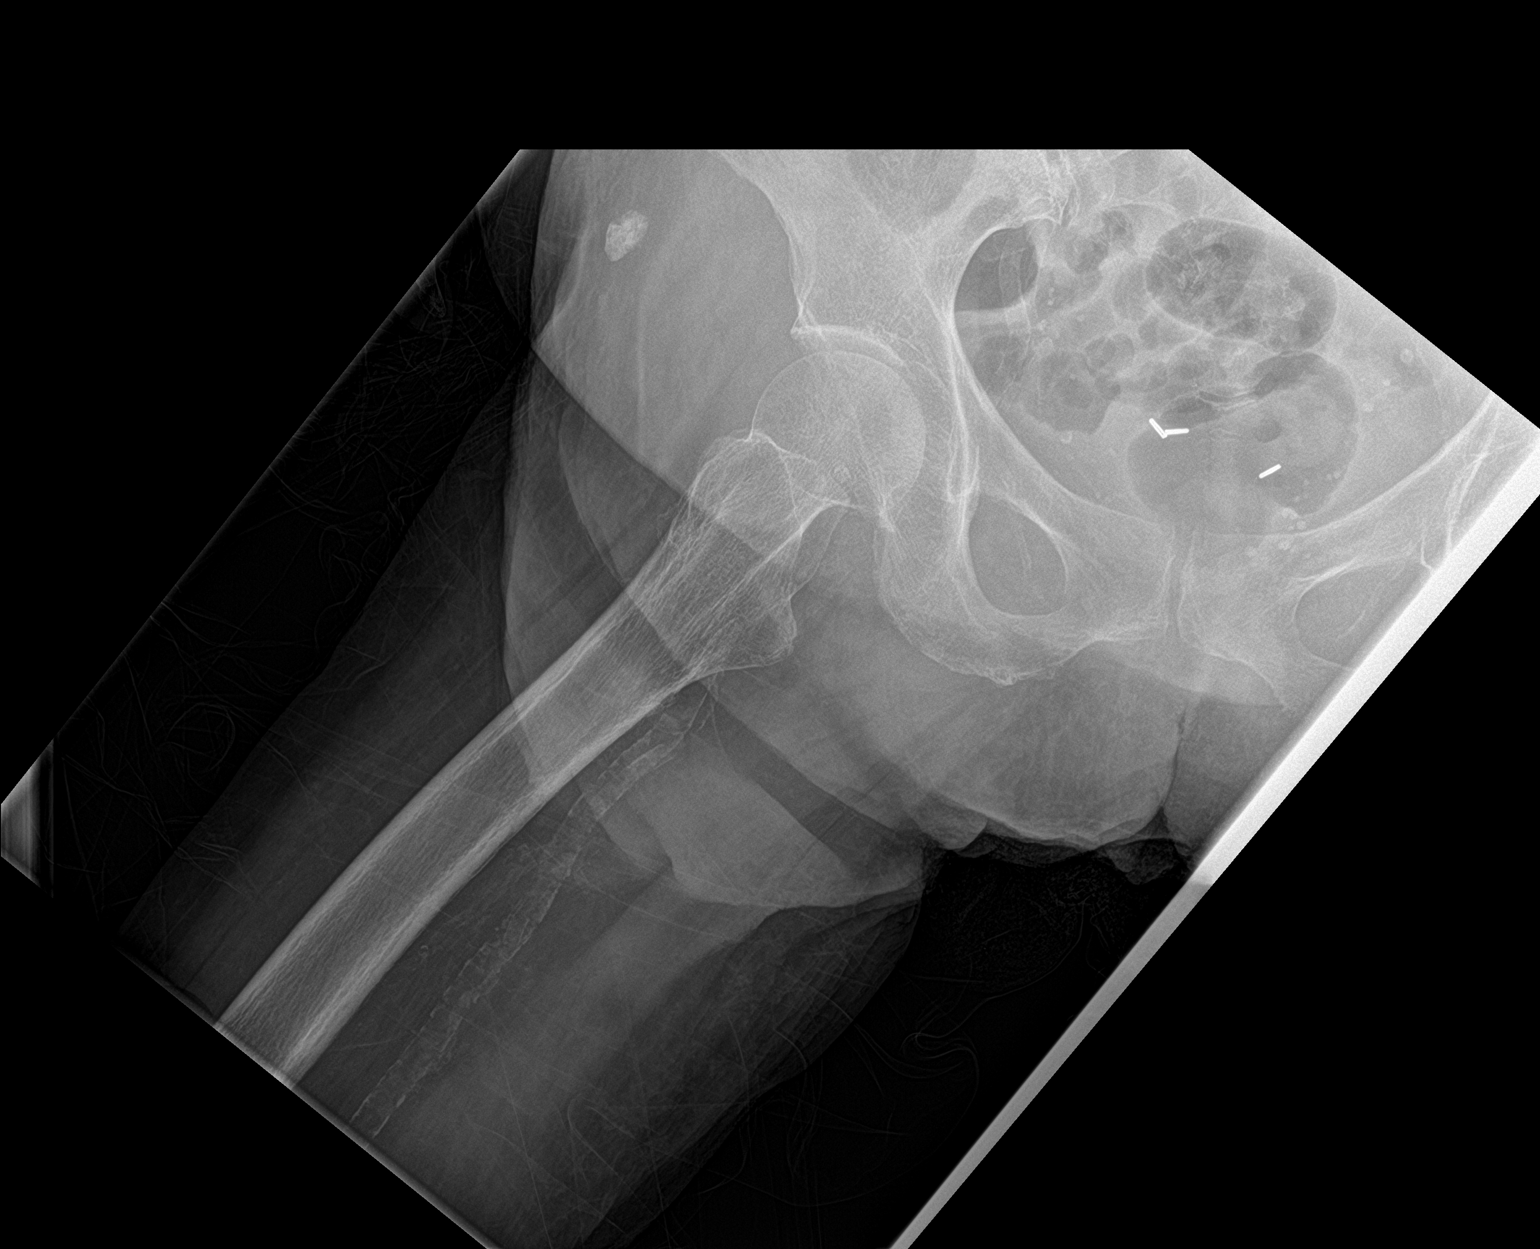

[3 of 3 positions shown; findings below may reference images not displayed]

FINDINGS: There is no evidence of hip fracture or dislocation. There is no
evidence of arthropathy or other focal bone abnormality. Extensive
regional arterial calcification.
IMPRESSION: Negative.

## 2019-02-20 IMAGING — CR DG LUMBAR SPINE 2-3V
1 series · 3 of 3 positions shown · non-contrast
Comparison: 05/10/2017

CLINICAL DATA: Found on the floor.  Right-sided pain.

EXAM:
LUMBAR SPINE - 2-3 VIEW

[Series 1: dg lumbar spine 2-3 views · 0.14mm/px · 3 of 3 slices shown]
[im 1/3]
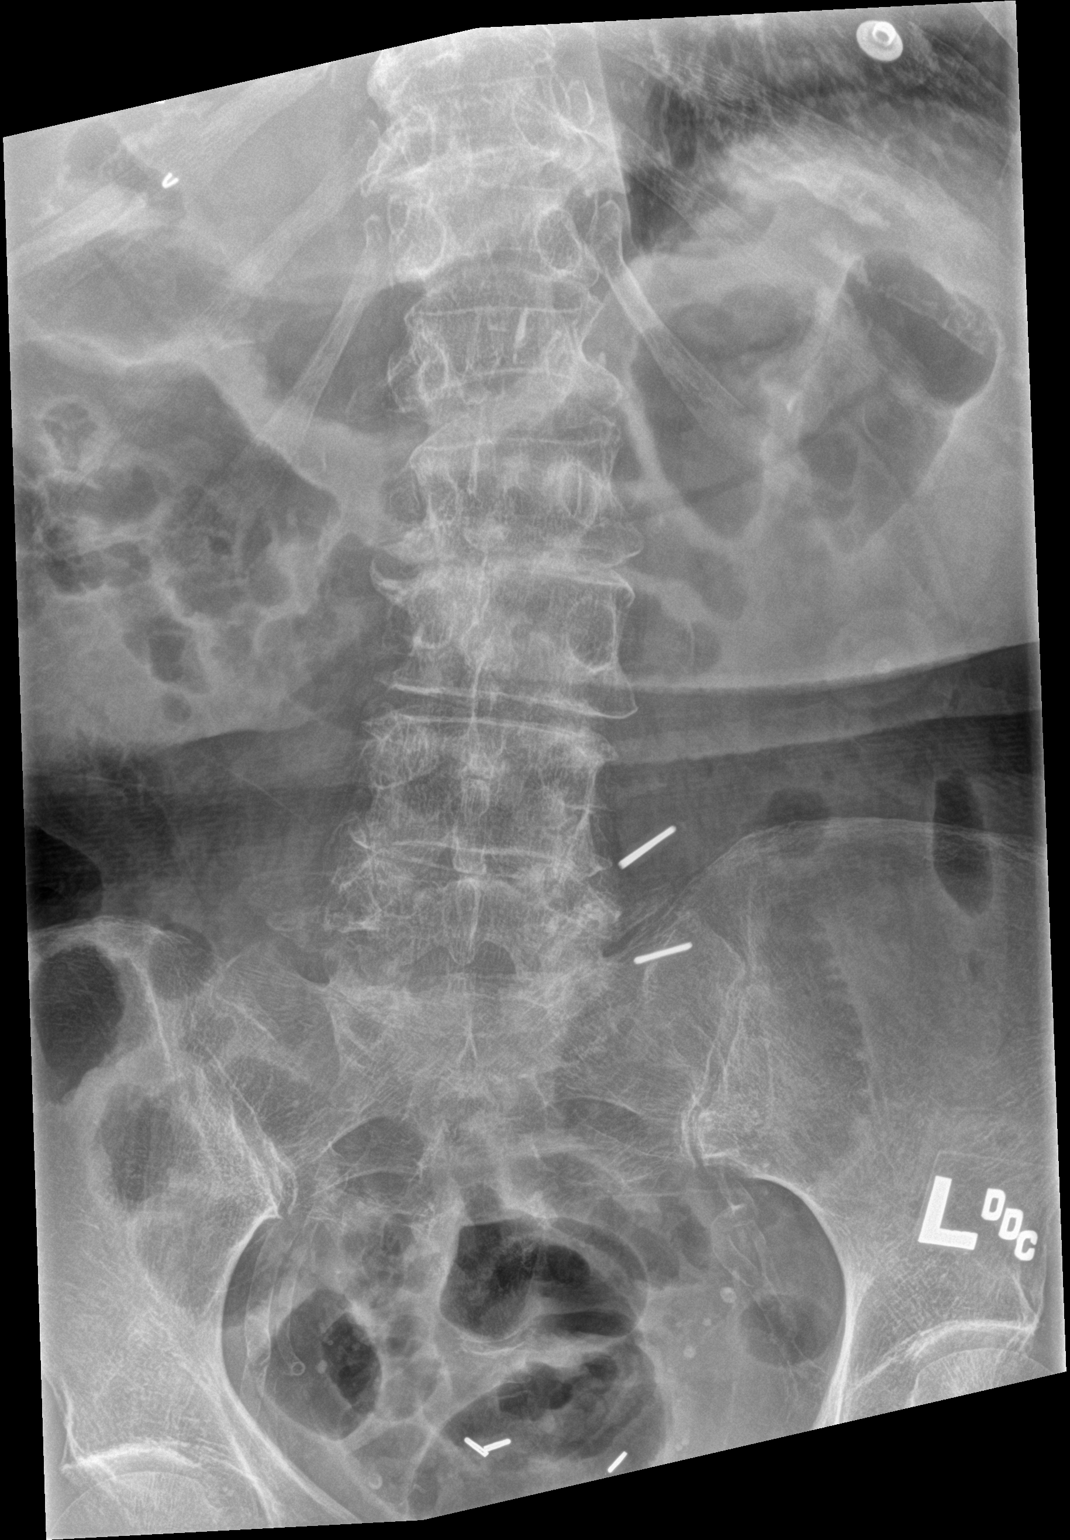
[im 2/3]
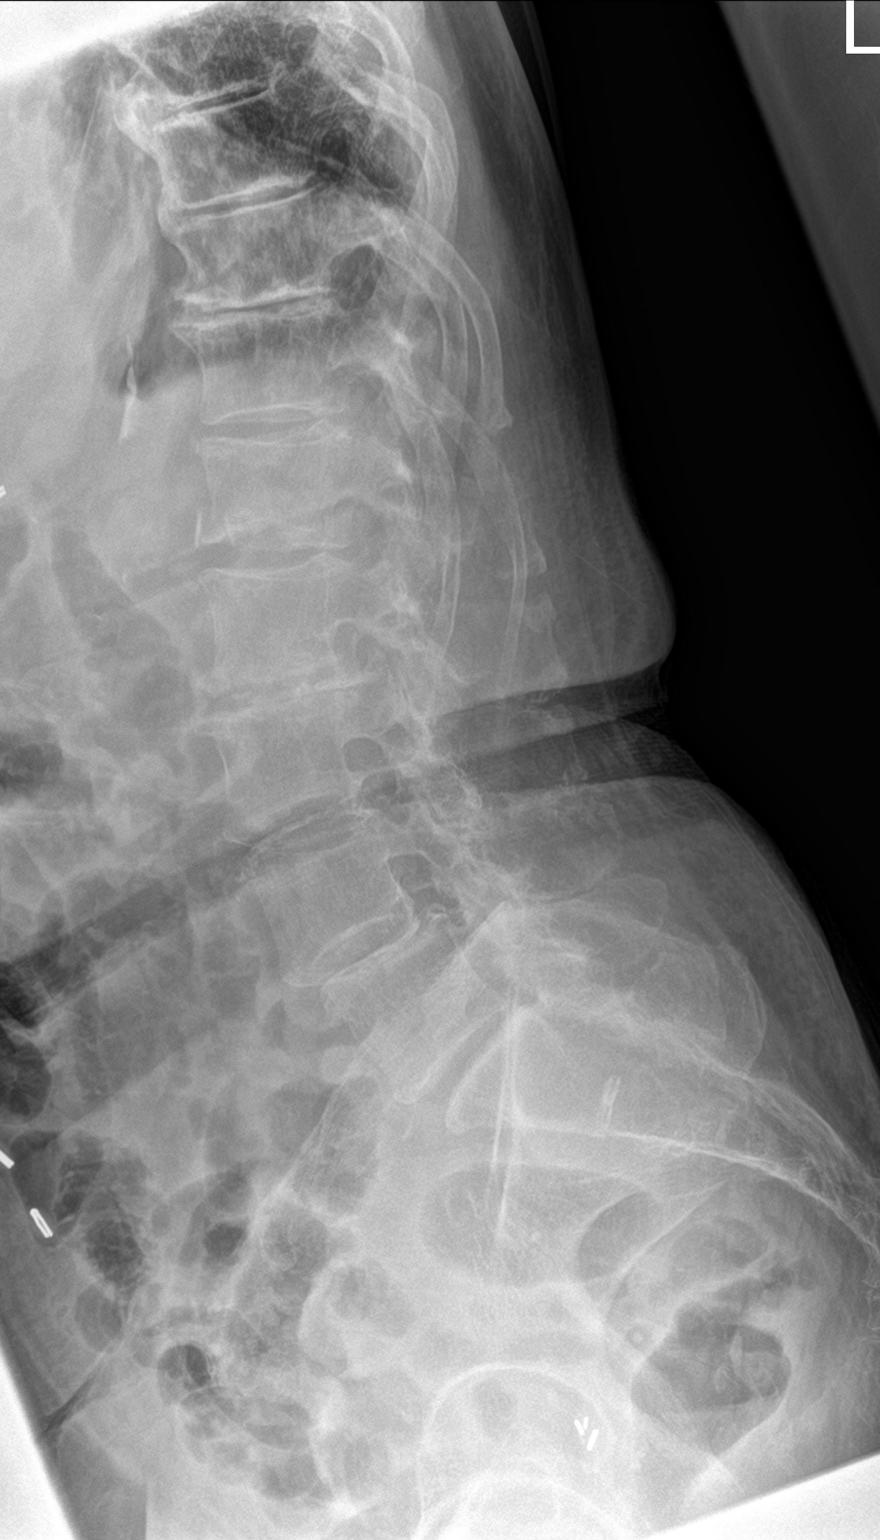
[im 3/3]
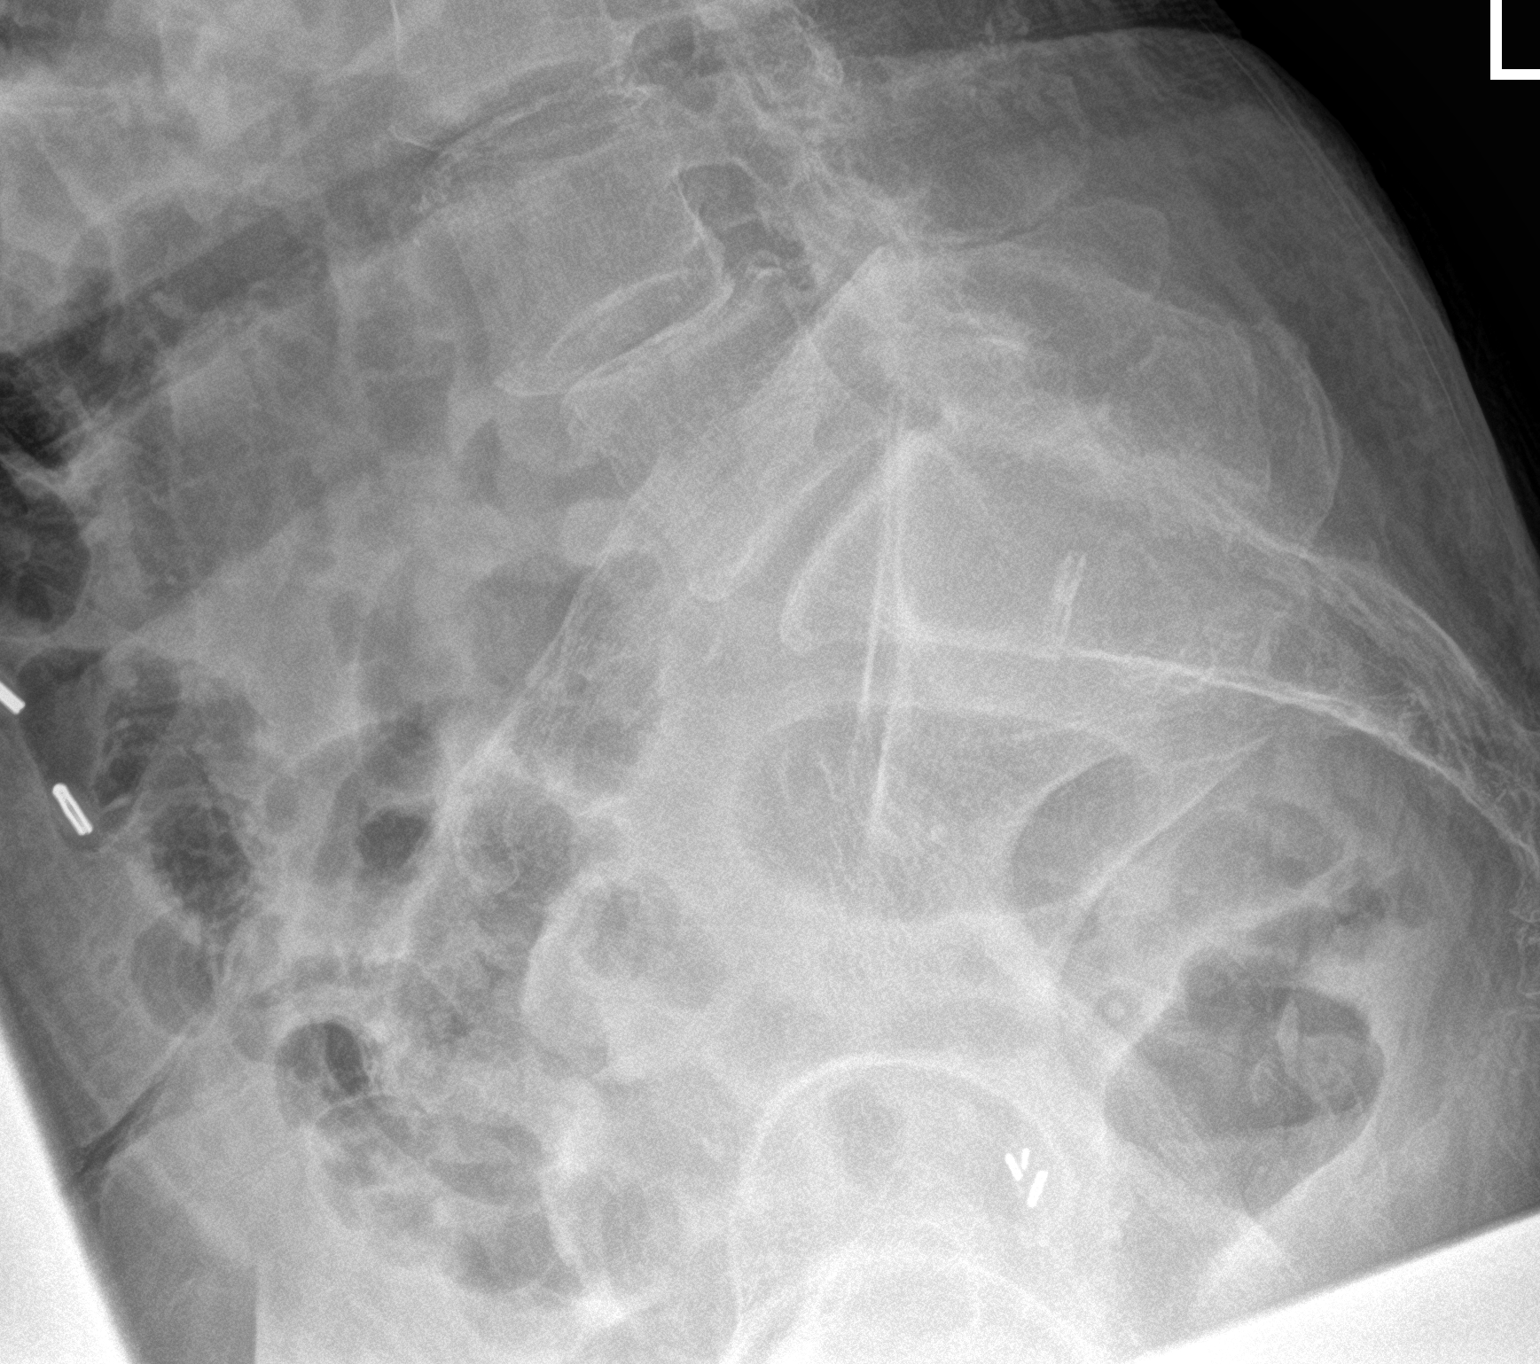

[3 of 3 positions shown; findings below may reference images not displayed]

FINDINGS: Curvature convex to the left. 8 mm anterolisthesis L4-5 because of
facet arthropathy. Disc space narrowing, most pronounced at L2-3.
Newly seen inferior endplate deformity at L1 which could represent a
recent fracture. This was not visible on the CT of 05/10/2017. Lower
thoracic degenerative changes as seen previously.
IMPRESSION: Chronic degenerative changes throughout the lumbar spine. Newly seen
inferior endplate deformity at L1 that could represent a recent
minor inferior endplate fracture. This was not visible on the recent
CT.

## 2019-02-21 IMAGING — CT CT HIP*R* W/O CM
2 of 5 series · 16 of 46 positions shown, 19 images · non-contrast
Comparison: None.

CLINICAL DATA: History rheumatoid arthritis. Severe fall with right
hip pain.

EXAM:
CT OF THE RIGHT HIP WITHOUT CONTRAST
TECHNIQUE: Multidetector CT imaging of the right hip was performed according to
the standard protocol. Multiplanar CT image reconstructions were
also generated.

[Series 3: axial st · axial · 0.38mm/px · z∈[-905,-741]mm · 13 of 91 slices shown, 16 images]
[im 6/91  soft-tissue]
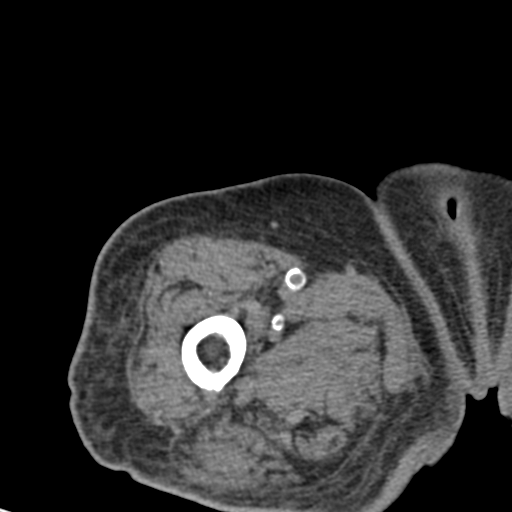
[im 6/91  bone]
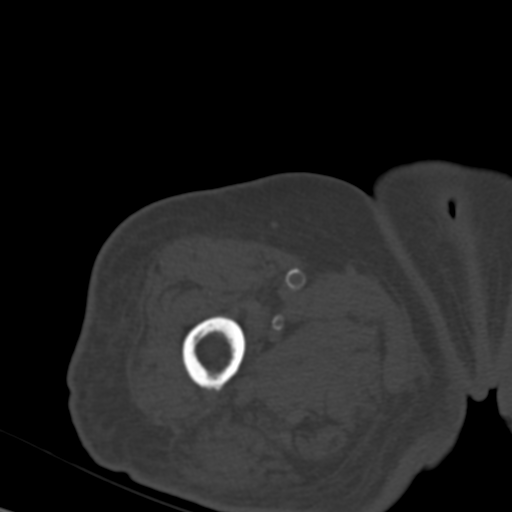
[im 15/91  soft-tissue]
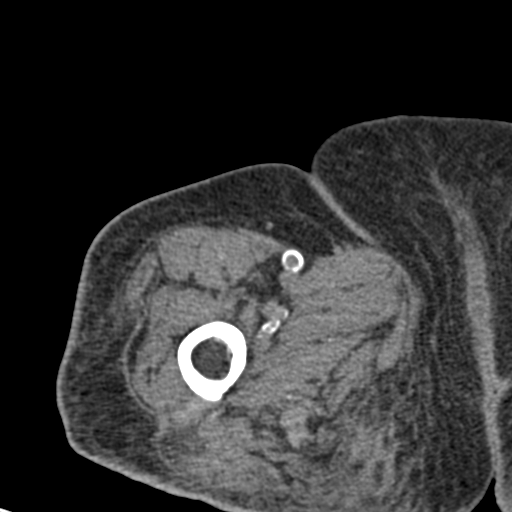
[im 24/91  soft-tissue]
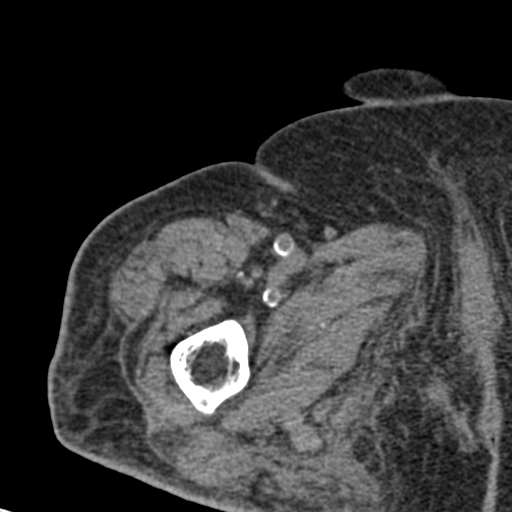
[im 32/91  soft-tissue]
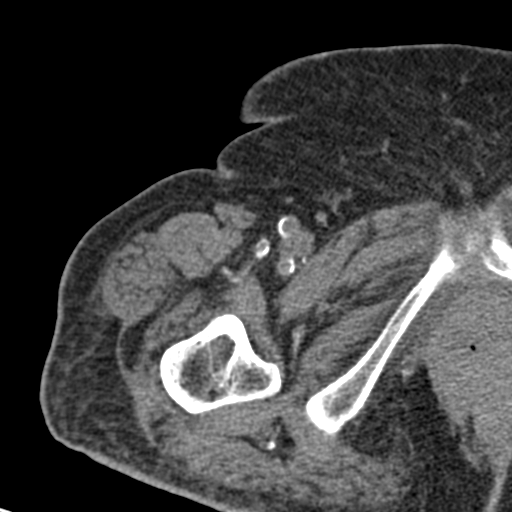
[im 41/91  soft-tissue]
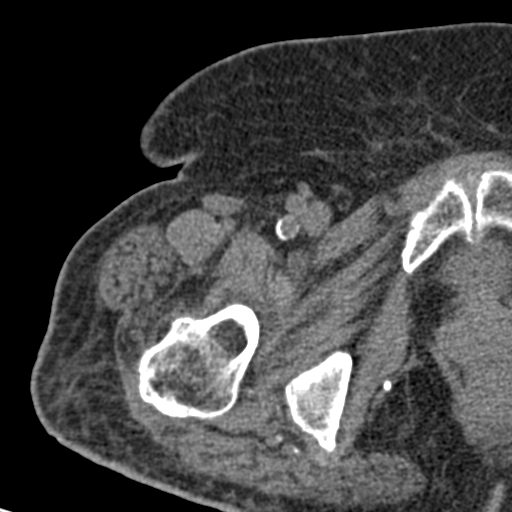
[im 50/91  soft-tissue]
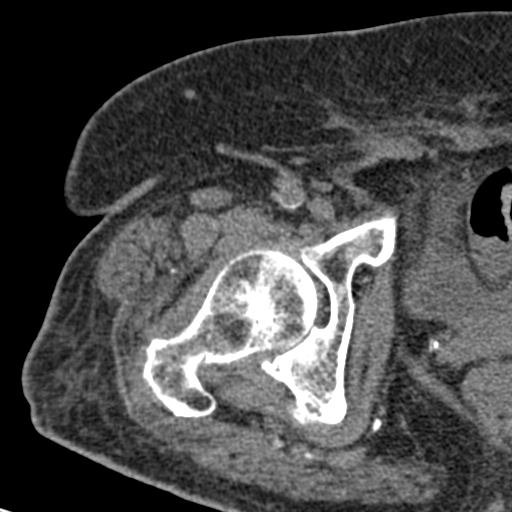
[im 59/91  soft-tissue]
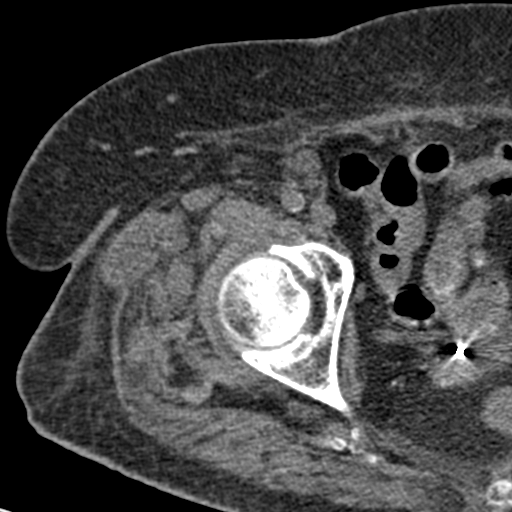
[im 67/91  soft-tissue]
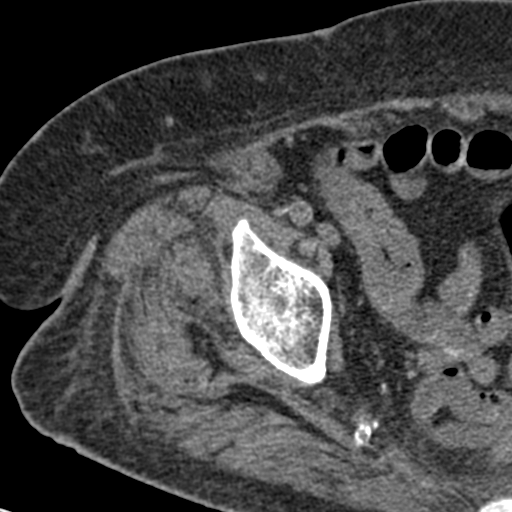
[im 76/91  soft-tissue]
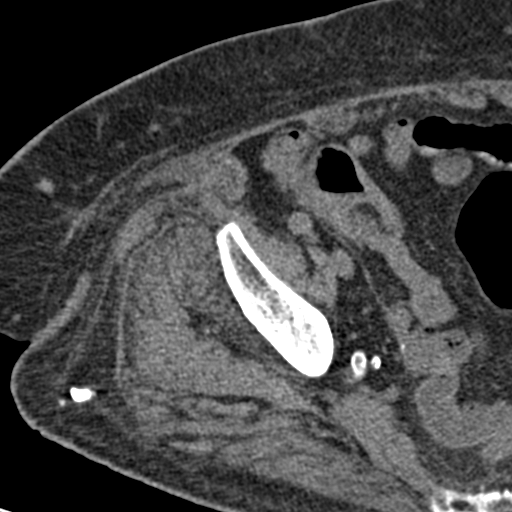
[im 76/91  bone]
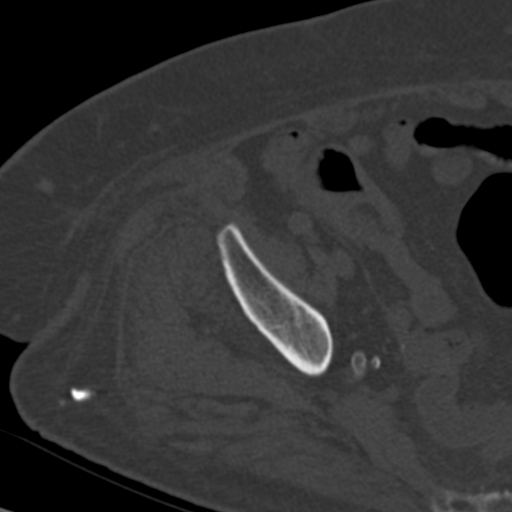
[im 79/91  lung]
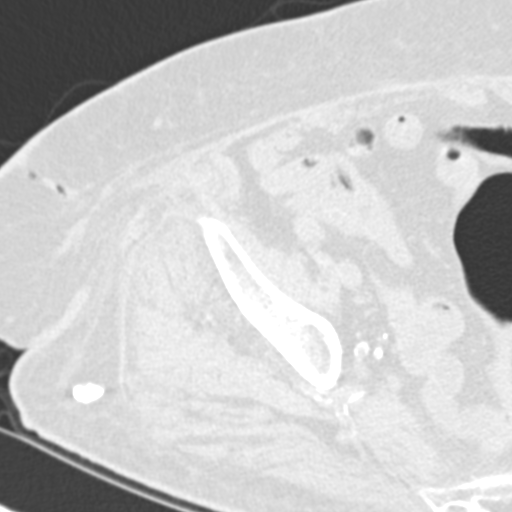
[im 82/91  lung]
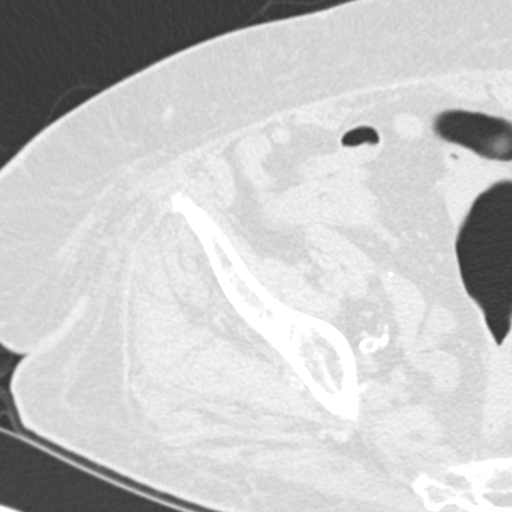
[im 85/91  soft-tissue]
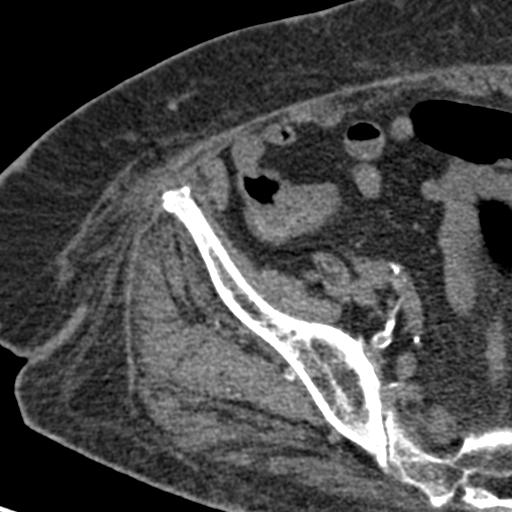
[im 85/91  lung]
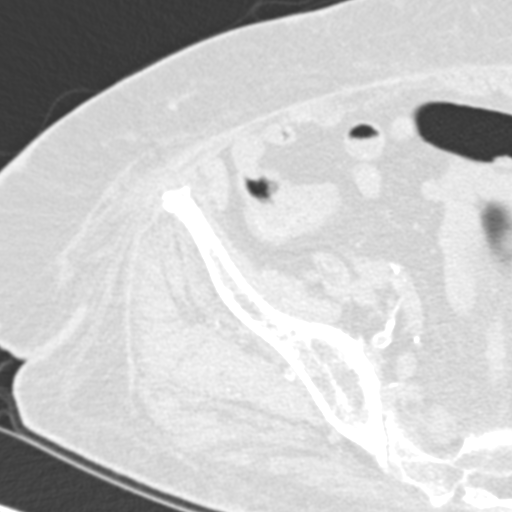
[im 88/91  lung]
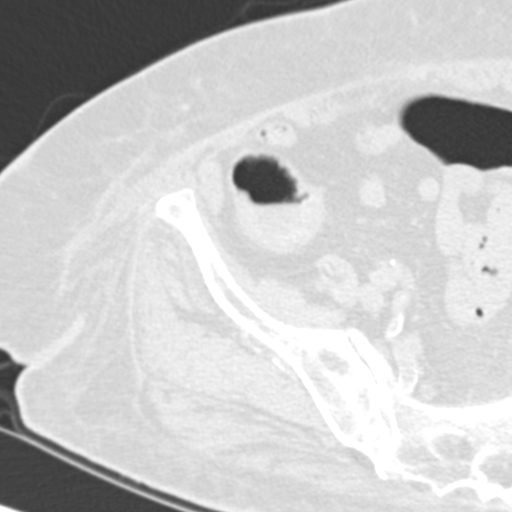

[Series 10: coronal st · coronal · 0.37mm/px · 3 of 77 slices shown]
[im 20/77  soft-tissue]
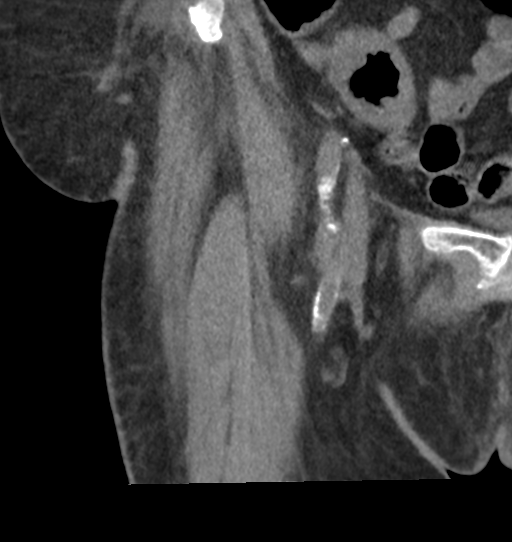
[im 39/77  soft-tissue]
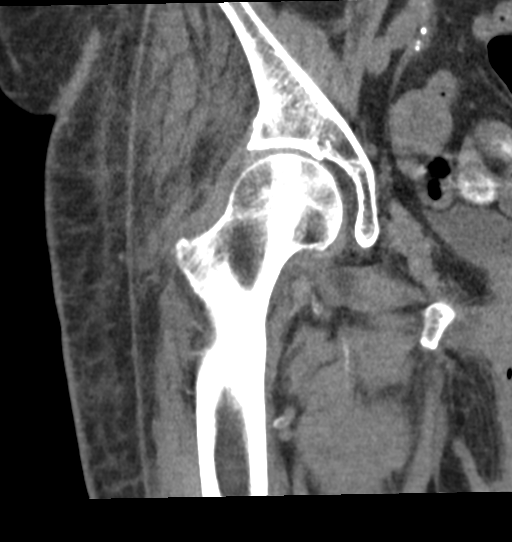
[im 58/77  soft-tissue]
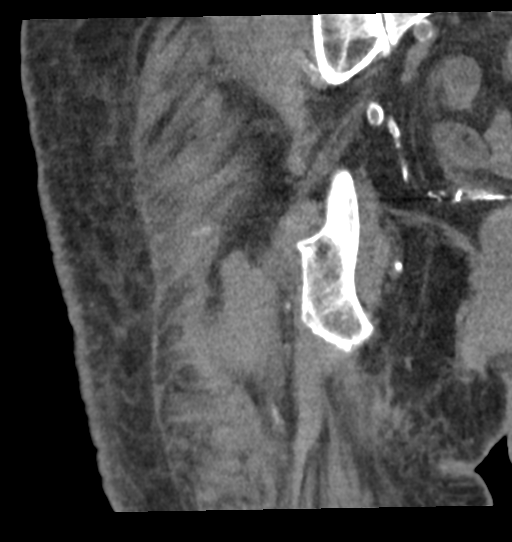

[16 of 46 positions shown; findings below may reference images not displayed]

FINDINGS: Bones/Joint/Cartilage

No fracture or dislocation. Normal alignment. No joint effusion. Hip
joint space is maintained. No lytic or sclerotic osseous lesion.
Mild osteoarthritis of the sacroiliac joint.

Ligaments

Ligaments are suboptimally evaluated by CT.

Muscles and Tendons
Muscles are normal.  No intramuscular fluid collection or hematoma.

Soft tissue
No fluid collection or hematoma. No soft tissue mass. Peripheral
vascular atherosclerotic disease.
IMPRESSION: No acute osseous injury of the right hip.

## 2019-02-22 IMAGING — MR MR LUMBAR SPINE W/O CM
4 of 5 series · 18 of 48 positions shown · non-contrast
Comparison: MRI lumbar spine 05/19/2016.

CLINICAL DATA: Low back pain radiating into the right hip and leg
for 3 weeks since a fall.

EXAM:
MRI LUMBAR SPINE WITHOUT CONTRAST
TECHNIQUE: Multiplanar, multisequence MR imaging of the lumbar spine was
performed. No intravenous contrast was administered.

[Series 3: T2 · sagittal · 4.0mm · 0.55mm/px · 6 of 17 slices shown (1 of 2)]
[im 1/17]
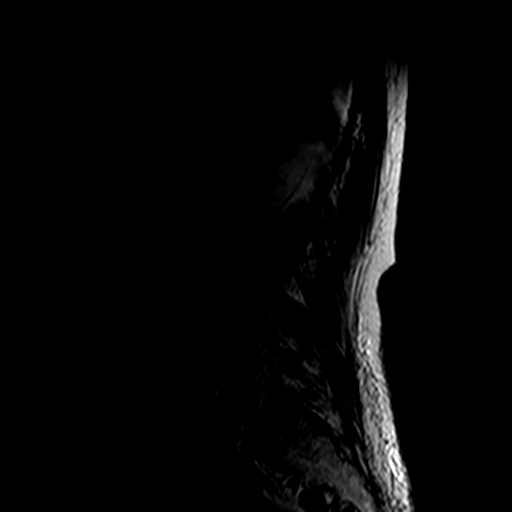
[im 4/17]
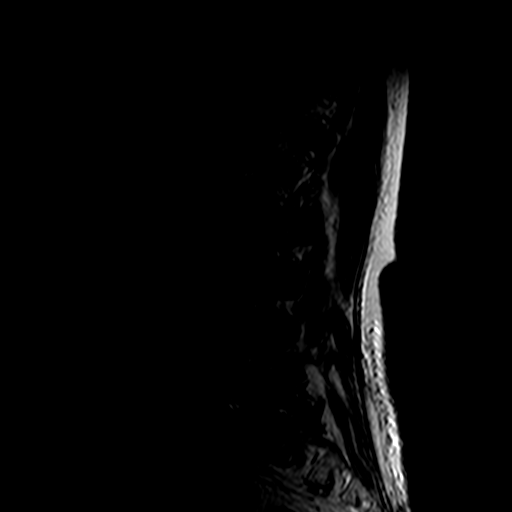
[im 7/17]
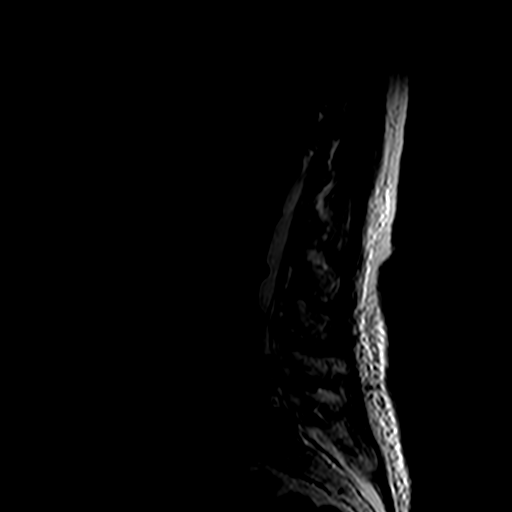
[im 10/17]
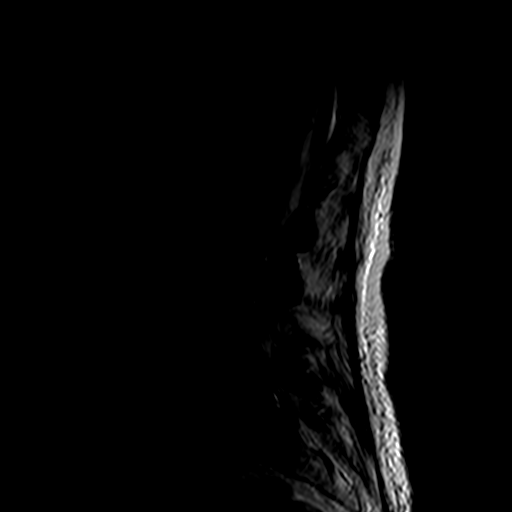
[im 13/17]
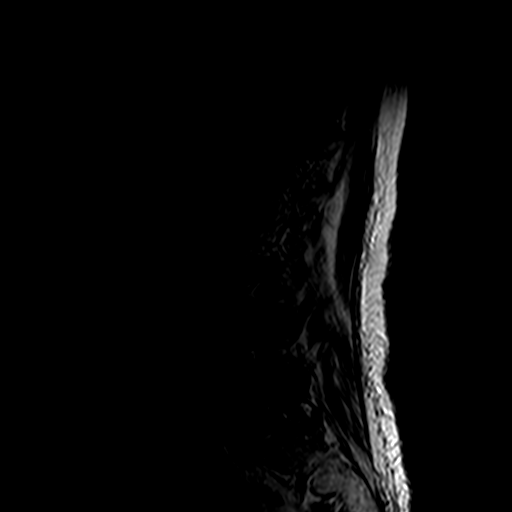
[im 17/17]
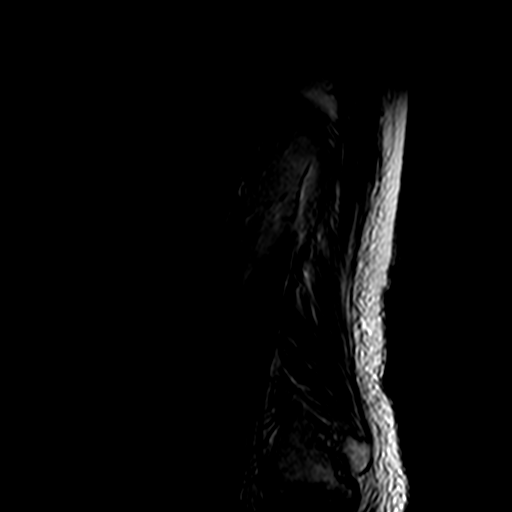

[Series 4: T1 · sagittal · 4.0mm · 0.55mm/px · 3 of 17 slices shown (1 of 2)]
[im 3/17]
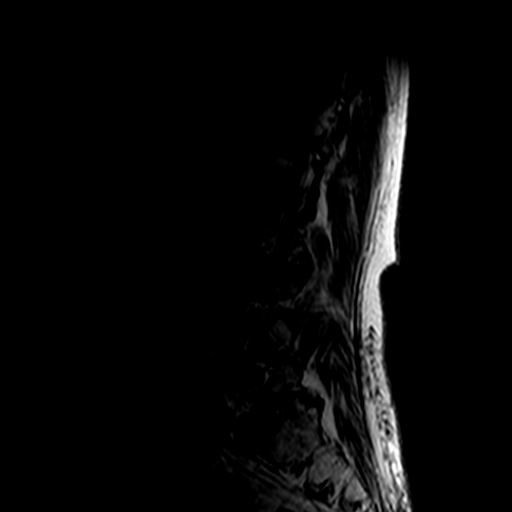
[im 9/17]
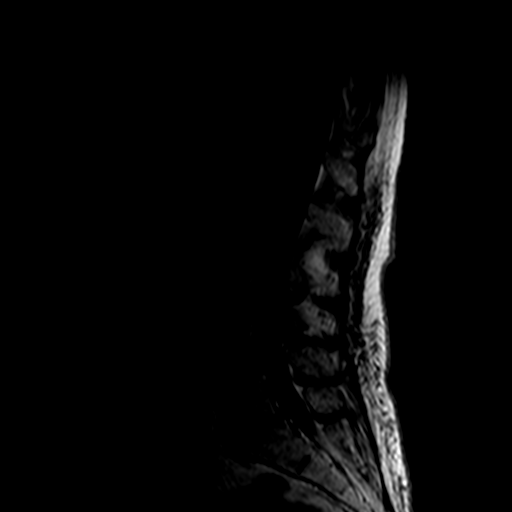
[im 14/17]
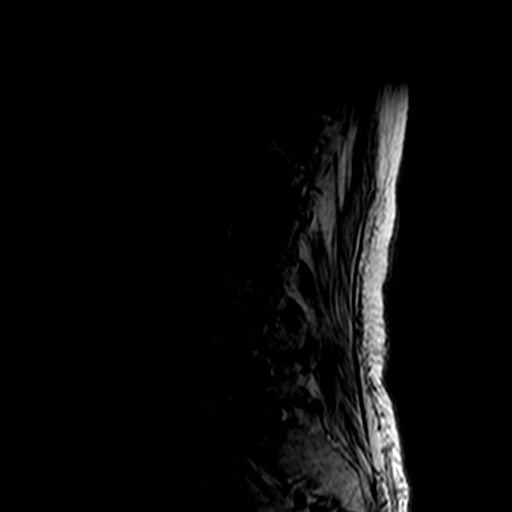

[Series 6: T2 · axial · 4.0mm · 0.39mm/px · z∈[-50,+120]mm · 6 of 34 slices shown (2 of 2)]
[im 1/34]
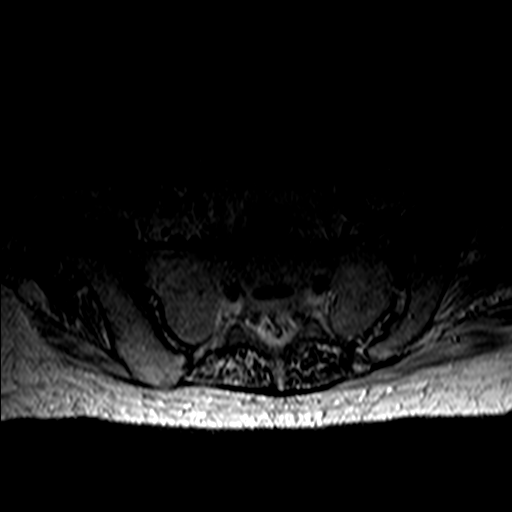
[im 6/34]
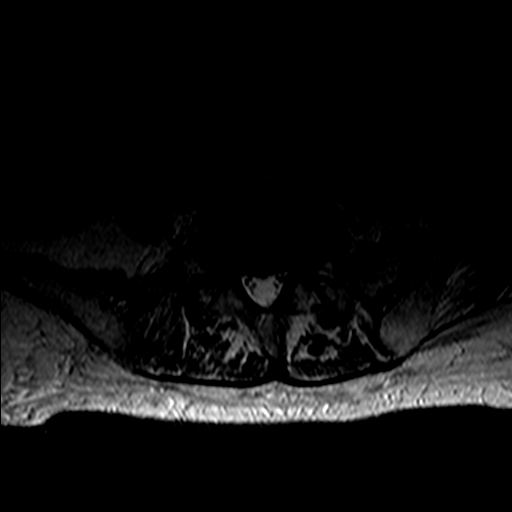
[im 11/34]
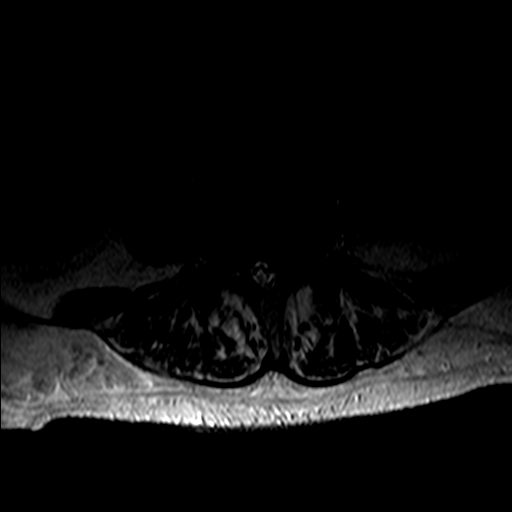
[im 16/34]
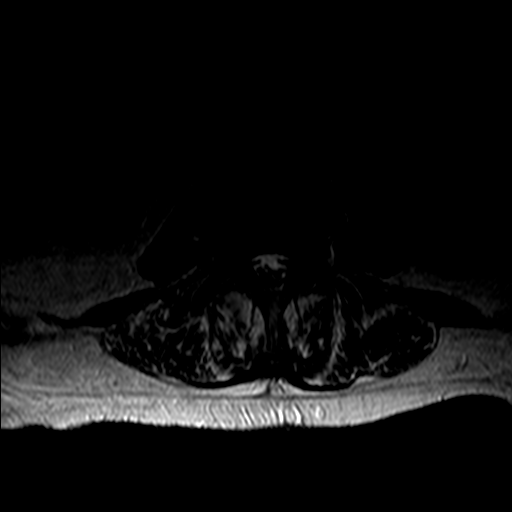
[im 18/34]
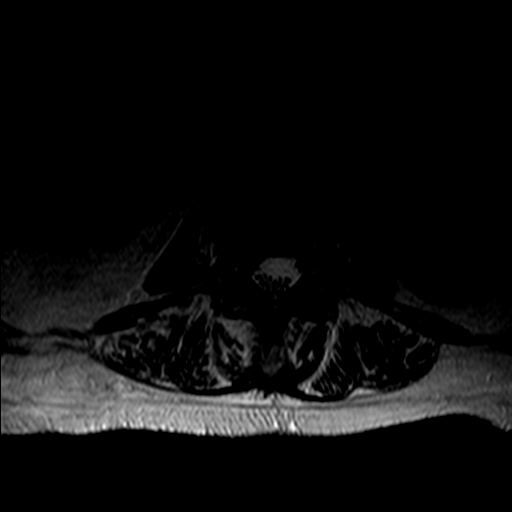
[im 28/34]
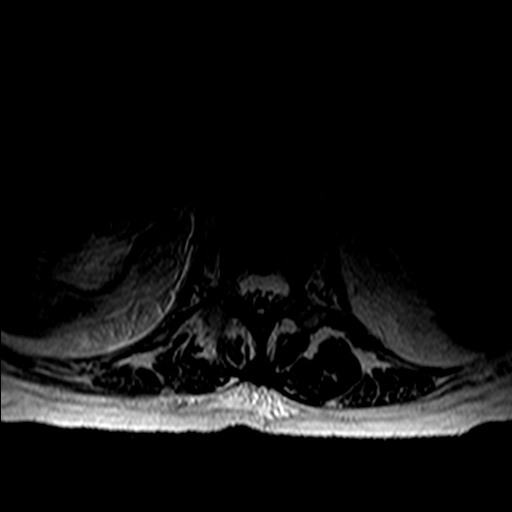

[Series 7: T1 · axial · 4.0mm · 0.39mm/px · z∈[-25,+120]mm · 3 of 34 slices shown (2 of 2)]
[im 6/34]
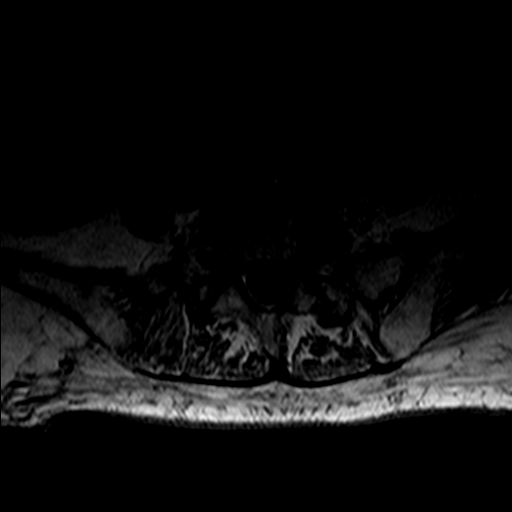
[im 18/34]
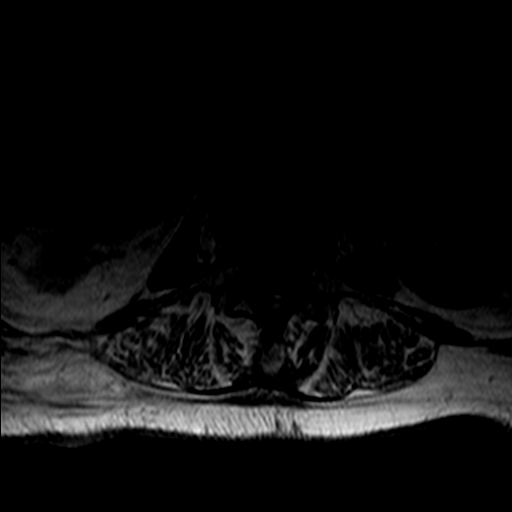
[im 28/34]
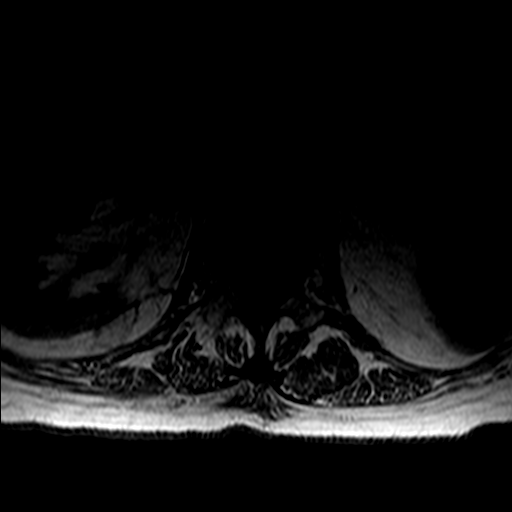

[18 of 48 positions shown; findings below may reference images not displayed]

FINDINGS: Segmentation:  Standard.

Alignment: Grade 1 retrolisthesis L3 on L4, L4 on L5 and L5 on S1
due to facet arthropathy is unchanged. Convex left scoliosis with
the apex at L2-3 is also unchanged.

Vertebrae: The patient has an inferior endplate compression fracture
of L1 with vertebral body height loss of up to 40-50%. There is
marrow edema in the vertebral body consistent with acute or subacute
injury. No other fracture is identified. Scattered degenerative
endplate signal change is most notable eccentric to the right at
L2-3.

Conus medullaris: Extends to the L1 level and appears normal.

Paraspinal and other soft tissues: Negative.

Disc levels:

T9-10, T10-11 and T11-12 are imaged in the sagittal plane only. The
central canal and foramina appear open at each level. Small right
paracentral protrusion at T11-12 is noted.

T12-L1: Central disc protrusion with some caudal extension is
identified and was present on the prior examination. The disc
indents the ventral thecal sac but does not compress the conus.
Foramina are open. The appearance is not markedly changed.

L1-2: The central canal and foramina are widely patent. No bony
retropulsion off the inferior endplate of L1.

L2-3: Disc space narrowing and a broad-based protrusion more
prominent to the right are unchanged. Right worse than left
foraminal narrowing is seen. The central canal and right
subarticular recess are mildly narrowed.

L3-4: Facet degenerative disease and a shallow broad-based disc
bulge are identified. Mild central canal and left lateral recess
narrowing are unchanged. There is also bilateral foraminal narrowing
which appears slightly worse on the left and unchanged.

L4-5: The patient has a broad-based disc bulge with a superimposed
left lateral recess and foraminal protrusion. Moderate moderately
severe central canal stenosis is present with left much worse than
right lateral recess and foraminal narrowing. The appearance is not
markedly changed.

L5-S1: Bilateral facet degenerative disease and a shallow
broad-based disc bulge. The central canal is open. Mild to moderate
foraminal narrowing is worse on the left. The appearance is
unchanged.
IMPRESSION: The examination is positive for an acute or subacute inferior
endplate compression fracture of L1 with vertebral body height loss
of 40-50%. No bony retropulsion.

No change in multilevel spondylosis appearing worst at L4-5 as
described above.

## 2019-02-25 IMAGING — CR DG C-ARM 61-120 MIN
1 series · 1 of 1 positions shown · non-contrast
Comparison: MRI  06/11/2017

CLINICAL DATA: Kyphoplasty

EXAM:
LUMBAR SPINE - 2-3 VIEW; DG C-ARM 61-120 MIN

[grl1]
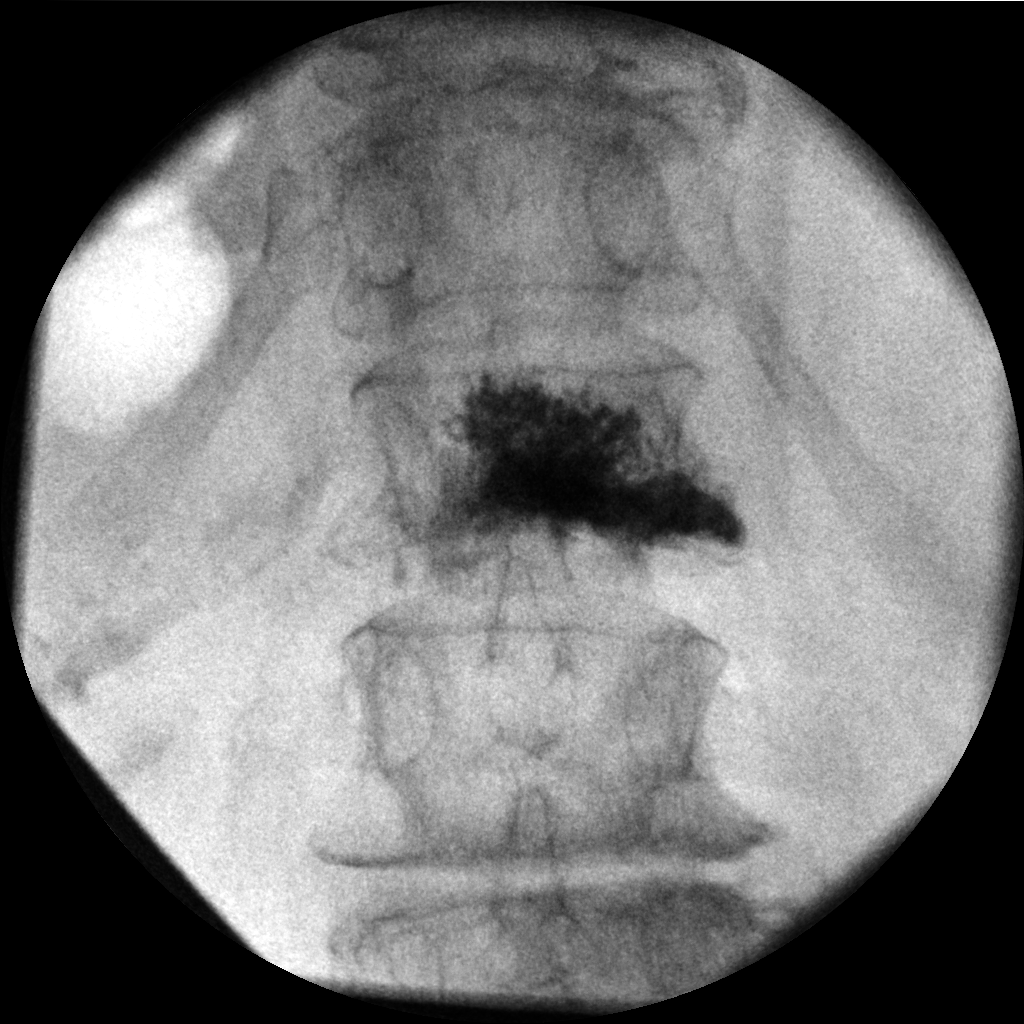

[1 of 1 positions shown; findings below may reference images not displayed]

FINDINGS: Two low resolution spot intraoperative views of the lumbar spine.
Total fluoroscopy time was 31 seconds. The images demonstrate
kyphoplasty at what appears to be L1.
IMPRESSION: Intraoperative fluoroscopic assistance provided during kyphoplasty

## 2019-02-28 ENCOUNTER — Other Ambulatory Visit: Payer: Self-pay

## 2019-02-28 ENCOUNTER — Ambulatory Visit (INDEPENDENT_AMBULATORY_CARE_PROVIDER_SITE_OTHER): Payer: Medicare Other | Admitting: Internal Medicine

## 2019-02-28 ENCOUNTER — Encounter: Payer: Self-pay | Admitting: Internal Medicine

## 2019-02-28 VITALS — BP 102/70 | Temp 97.9°F | Ht <= 58 in | Wt 134.2 lb

## 2019-02-28 DIAGNOSIS — I442 Atrioventricular block, complete: Secondary | ICD-10-CM

## 2019-02-28 DIAGNOSIS — Z950 Presence of cardiac pacemaker: Secondary | ICD-10-CM | POA: Diagnosis not present

## 2019-02-28 LAB — CUP PACEART INCLINIC DEVICE CHECK
Battery Remaining Longevity: 106 mo
Battery Voltage: 2.98 V
Brady Statistic RA Percent Paced: 1.6 %
Brady Statistic RV Percent Paced: 83 %
Date Time Interrogation Session: 20200721121346
Implantable Lead Implant Date: 20200313
Implantable Lead Implant Date: 20200313
Implantable Lead Location: 753859
Implantable Lead Location: 753860
Implantable Lead Model: 5076
Implantable Lead Model: 5076
Implantable Pulse Generator Implant Date: 20200313
Lead Channel Impedance Value: 450 Ohm
Lead Channel Impedance Value: 600 Ohm
Lead Channel Pacing Threshold Amplitude: 0.75 V
Lead Channel Pacing Threshold Amplitude: 0.75 V
Lead Channel Pacing Threshold Amplitude: 0.75 V
Lead Channel Pacing Threshold Amplitude: 0.75 V
Lead Channel Pacing Threshold Pulse Width: 0.5 ms
Lead Channel Pacing Threshold Pulse Width: 0.5 ms
Lead Channel Pacing Threshold Pulse Width: 0.5 ms
Lead Channel Pacing Threshold Pulse Width: 0.5 ms
Lead Channel Sensing Intrinsic Amplitude: 2.5 mV
Lead Channel Setting Pacing Amplitude: 2 V
Lead Channel Setting Pacing Amplitude: 2.5 V
Lead Channel Setting Pacing Pulse Width: 0.5 ms
Lead Channel Setting Sensing Sensitivity: 4 mV
Pulse Gen Model: 2272
Pulse Gen Serial Number: 9118930

## 2019-02-28 NOTE — Progress Notes (Signed)
Patient Care Team: Donato Schultz, MD as PCP - [facility] Denice Bors, MD as Consulting Physician (Cardiology)   HPI  Meghan [name] is a 73 y.o. female With a history of a previously implanted loop recorder 2018 for syncope.  With recurrent syncope 3/20 she was seen by [place].  Device was interrogated and demonstrated multiple episodes of pauses and heart block.  She underwent pacing.  Also noted to have SCAF.  No recurrent syncope  Records and Results Reviewed   Past Medical History:  Diagnosis Date  . Anginal pain ([place])   . Anxiety   . Arthritis    RA  . Asthma   . Brain tumor (benign) ([place])   . CHF (congestive heart failure) ([place])   . Collagen vascular disease ([place])   . COPD (chronic obstructive pulmonary disease) ([place])   . Coronary artery disease   . DDD (degenerative disc disease)   . Depression   . Diabetes mellitus   . GERD (gastroesophageal reflux disease)   . Headache   . Heart murmur   . Heart murmur   . Hypercholesteremia   . Hypertension   . Lumbar degenerative disc disease   . Migraines   . Obesity   . Osteopenia   . Osteoporosis   . Pneumonia   . PTSD (post-traumatic stress disorder)     Past Surgical History:  Procedure Laterality Date  . ABDOMINAL HYSTERECTOMY    . CERVICAL FUSION    . CHOLECYSTECTOMY    . COLONOSCOPY WITH PROPOFOL N/A 09/20/2015   Procedure: COLONOSCOPY WITH PROPOFOL;  Surgeon: Josefine Class, MD;  Location: [facility] ENDOSCOPY;  Service: Endoscopy;  Laterality: N/A;  . COLONOSCOPY WITH PROPOFOL N/A 01/27/2016   Procedure: COLONOSCOPY WITH PROPOFOL;  Surgeon: Manya Silvas, MD;  Location: [facility] ENDOSCOPY;  Service: Endoscopy;  Laterality: N/A;  . FINGER ARTHROPLASTY  02/23/2012   Procedure: FINGER ARTHROPLASTY;  Surgeon: Cammie Sickle., MD;  Location: [place];  Service: Orthopedics;  Laterality: Left;  Extensor carpi radialis longus to Extensor carpi ulnaris transfer, left Metaphalangeal  reconstructions of index and long fingers,  . FOOT ARTHROPLASTY     toes x2 rt foot  . HAND RECONSTRUCTION  2011   right-multiple finger joint reconst  . JOINT REPLACEMENT     bilat knee replacements  . KYPHOPLASTY N/A 06/14/2017   Procedure: KYPHOPLASTY L1;  Surgeon: Hessie Knows, MD;  Location: [facility] ORS;  Service: Orthopedics;  Laterality: N/A;  . LOOP RECORDER INSERTION N/A 06/23/2017   Procedure: LOOP RECORDER INSERTION;  Surgeon: Isaias Cowman, MD;  Location: [place] CV LAB;  Service: Cardiovascular;  Laterality: N/A;  . LOOP RECORDER REMOVAL N/A 10/21/2018   Procedure: LOOP RECORDER REMOVAL;  Surgeon: Deboraha Sprang, MD;  Location: [place] CV LAB;  Service: Cardiovascular;  Laterality: N/A;  . ORIF FEMUR FRACTURE Right 10/22/2018   Procedure: OPEN REDUCTION INTERNAL FIXATION (ORIF) DISTAL FEMUR FRACTURE;  Surgeon: Marchia Bond, MD;  Location: [place];  Service: Orthopedics;  Laterality: Right;  . PACEMAKER IMPLANT N/A 10/21/2018   Procedure: PACEMAKER IMPLANT;  Surgeon: Deboraha Sprang, MD;  Location: [place] CV LAB;  Service: Cardiovascular;  Laterality: N/A;    Current Meds  Medication Sig  . albuterol (VENTOLIN HFA) 108 (90 Base) MCG/ACT inhaler Inhale 1-2 puffs into the lungs every 6 (six) hours as needed for wheezing or shortness of breath.  . busPIRone (BUSPAR) 7.5 MG tablet Take 1 tablet by mouth 2 (  two) times daily.  . Calcium Carb-Cholecalciferol (CALCIUM 500/D) 500-400 MG-UNIT CHEW Chew by mouth daily.  . Cholecalciferol 1.25 MG (50000 UT) TABS Take by mouth once a week.  . clonazePAM (KLONOPIN) 0.5 MG tablet Take 0.125 mg by mouth 2 (two) times daily.  . diclofenac sodium (VOLTAREN) 1 % GEL Apply 2 g topically 3 (three) times daily. Apply to the fingers  . escitalopram (LEXAPRO) 10 MG tablet Take 1 tablet (10 mg total) by mouth every morning. (Patient taking differently: Take 20 mg by mouth every morning. )  . folic acid (FOLVITE) 1 MG tablet Take 1 mg  by mouth daily.  . furosemide (LASIX) 40 MG tablet Take 40 mg by mouth 2 (two) times daily.  . Ipratropium-Albuterol (COMBIVENT RESPIMAT) 20-100 MCG/ACT AERS respimat Inhale 1 puff into the lungs every 6 (six) hours.  Marland Kitchen lamoTRIgine (LAMICTAL) 100 MG tablet Take 1 tablet (100 mg total) by mouth daily.  Marland Kitchen loperamide (IMODIUM A-D) 2 MG tablet Take 1 tablet (2 mg total) by mouth 4 (four) times daily as needed for diarrhea or loose stools. (Patient taking differently: Take 2 mg by mouth as needed for diarrhea or loose stools. )  . loratadine (CLARITIN) 10 MG tablet Take 10 mg by mouth daily.  . methotrexate (RHEUMATREX) 7.5 MG tablet Take 7.5 mg by mouth once a week. Caution" Chemotherapy. Protect from light.  . mirtazapine (REMERON) 7.5 MG tablet Take 7.5 mg by mouth at bedtime.   Marland Kitchen omeprazole (PRILOSEC) 20 MG capsule Take 20 mg by mouth daily.   Marland Kitchen oxyCODONE-acetaminophen (PERCOCET/ROXICET) 5-325 MG tablet Take 1 tablet by mouth every 4 (four) hours as needed for moderate pain or severe pain.  Marland Kitchen oxyCODONE-acetaminophen (PERCOCET/ROXICET) 5-325 MG tablet Take 1 tablet by mouth 2 (two) times a day. scheduled  . Potassium Chloride ER 20 MEQ TBCR Take 20 mEq by mouth 2 (two) times daily.  . pramipexole (MIRAPEX) 0.25 MG tablet Take 0.25 mg by mouth daily.   . saxagliptin HCl (ONGLYZA) 5 MG TABS tablet Take 5 mg by mouth at bedtime.  . Tofacitinib Citrate 5 MG TABS Take 5 mg by mouth 2 (two) times daily after a meal.     Allergies  Allergen Reactions  . Amoxicillin Itching and Other (See Comments)    Has patient had a PCN reaction causing immediate rash, facial/tongue/throat swelling, SOB or lightheadedness with hypotension: Unknown Has patient had a PCN reaction causing severe rash involving mucus membranes or skin necrosis: Unknown Has patient had a PCN reaction that required hospitalization: Unknown Has patient had a PCN reaction occurring within the last 10 years: Unknown If all of the above  answers are "NO", then may proceed with Cephalosporin use.   . Gabapentin Other (See Comments)    Pt states that it causes her BP to drop.   . Naproxen Hives  . Nsaids Other (See Comments)    Reaction:  Unknown   . Tolmetin Other (See Comments)    Reaction:  Unknown       Review of Systems negative except from HPI and PMH  Physical Exam BP 102/70 (BP Location: Right Arm, Patient Position: Sitting, Cuff Size: Normal)   Temp 97.9 F (36.6 C)   Ht 4\' 8"  (1.422 m)   Wt 134 lb 4 oz (60.9 kg)   SpO2 94%   BMI 30.10 kg/m  Well developed and well nourished in no acute distress HENT normal Neck supple with JVP-flat Clear Device pocket well healed; without hematoma or erythema.  There is no tethering  Regular rate and rhythm, 2/6 murmur Abd-soft with active BS No Clubbing cyanosis   edema Skin-warm and dry A & Oriented  Grossly normal sensory and motor function  ECG sinus rhythm with P synchronous pacing at 82  Assessment and  Plan Complete heart block  SCAF  Rheumatoid arthritis  Pacemaker-Saint Jude  Patient without recurrent falls.  Device function notable for far field ventricular over sensing resulting in inappropriate mode switch.  Reprogrammed.  Outputs decreased.  Encouraged in ambulation.  Encouraged to have her PCPs review her medications as she is on 5 neuropsychiatric medications    Current medicines are reviewed at length with the patient today .  The patient does not  have concerns regarding medicines.

## 2019-02-28 NOTE — Patient Instructions (Addendum)
Medication Instructions:  - Your physician recommends that you continue on your current medications as directed. Please refer to the Current Medication list given to you today.  If you need a refill on your cardiac medications before your next appointment, please call your pharmacy.   Lab work: - none ordered  If you have labs (blood work) drawn today and your tests are completely normal, you will receive your results only by: Marland Kitchen MyChart Message (if you have MyChart) OR . A paper copy in the mail If you have any lab test that is abnormal or we need to change your treatment, we will call you to review the results.  Testing/Procedures: - none ordered  Follow-Up: At [facility], you and your health needs are our priority.  As part of our continuing mission to provide you with exceptional heart care, we have created designated Provider Care Teams.  These Care Teams include your primary Cardiologist (physician) and Advanced Practice Providers (APPs -  Physician Assistants and Nurse Practitioners) who all work together to provide you with the care you need, when you need it.  You will need a follow up appointment in 9 months with Dr. Caryl Comes (April 2021) . Please call our office 2 months in advance to schedule this appointment.  (Call in early February 2021 to schedule)  Any Other Special Instructions Will Be Listed Below (If Applicable). - N/A

## 2019-04-12 ENCOUNTER — Non-Acute Institutional Stay: Payer: Medicare Other | Admitting: Nurse Practitioner

## 2019-04-12 ENCOUNTER — Encounter: Payer: Self-pay | Admitting: Nurse Practitioner

## 2019-04-12 ENCOUNTER — Other Ambulatory Visit: Payer: Self-pay

## 2019-04-12 VITALS — BP 138/69 | HR 80 | Temp 97.0°F | Resp 18 | Wt 140.3 lb

## 2019-04-12 DIAGNOSIS — Z515 Encounter for palliative care: Secondary | ICD-10-CM

## 2019-04-12 NOTE — Progress Notes (Signed)
[facility] Consult Note Telephone: [phone]  Fax: [phone]  [name] DOB: 05/27/1946 MRN: [id number]  PRIMARY CARE PROVIDER:   Dr Yves Dill PROVIDER:  Dr Hodges/[facility] RESPONSIBLE PARTY:   Self  1.ACP: Medical goals consists DNR, do not intubate. Wishes are for more conservative care to include antibiotics, IV fluids, blood transfusions, diagnostic testing, lab testing. Wishes are to minimize hospitalizations if necessary.  2.Dyspneic R06.00 secondary to congestive heart failure remain stable at present time. Continue daily weights and outpatient Cardiology appointments  3.Generalized weaknessR53.1 secondary to CHFcontinue with therapy as able. Encourage energy conservation and rest times.  4. Palliative care encounter Z51.5; Palliative medicine team will continue to support patient, patient's family, and medical team. Visit consisted of counseling and education dealing with the complex and emotionally intense issues of symptom management and palliative care in the setting of serious and potentially life-threatening illness  I spent 30 minutes providing this consultation,  from 8:30am to 9:00am. More than 50% of the time in this consultation was spent coordinating communication.   HISTORY OF PRESENT ILLNESS:  Meghan [name] is a 73 y.o. year old female with multiple medical problems including COPD, congestive heart failure with ef 20%, asthma, collagen vascular disease, coronary artery disease, heart murmur, benign brain tumor, hypertension, gerd, hypercholesterolemia, history of headache, diabetes, gerd, arthritis, osteoporosis, lumbar degenerative disc disease, anxiety, PTSD, depression, kyphoplasty, right hand reconstruction 2011, cholecystectomy, cervical fusion, abdominal hysterectomy. Meghan [name] continues to reside at [place] [facility]. She is able to stand and the mobile, transferring but unable to bear weight on her right leg. She does require assistance for adl's. She does feed herself with a fair to good appetite. It all depends on what's being served. She does work on her computer, playing games and read often. At present Meghan [name] is lying in bed. She appears comfortable. No visitors present. I visited an observed Meghan [name]. We talked about purpose for palliative care follow up visit and she was in agreement. We talked about how she was feeling today. She verbalize it she is doing okay. She talked at length about intermittently experiencing some pain in her right foot which does keep her up at night. She does take Oxycodone 5 / 325 mg bid scheduled which does give her relief. We talked about shortness of breath but she denies. We talked about her mobility and current functional level. We talked about a typical day for her at [facility]. She just recently was moved to a new room with a new roommate. Explain endorses this has been much better for her. She is able to interact with her new roommate. We talked about her appetite. We talked about the games that she is been playing on the computer. She talked about family dynamics. We talked about the challenges of residing in facility with loss of [place]. We talked about coping strategies. We talked about decrease in activity availability although they do try to bring activities to their rooms secondary to covid-19 pandemic. We talked about medical goals of care was focus on comfort, DNR already in place. We talked to that role of palliative care and plan of care. Discussed with Meghan [name] will follow up in two months if needed or sooner should she declined. Meghan. [name] in agreement. I updated nursing staff. Meghan [name] is her own responsible party and share that she will  further discuss palliative care visit with her family.   8 / 7 / 2020 WBC 2.4, hemoglobin 11.3,  hematocrit 32.6, platelets 159  Palliative Care was asked to help to continue to address goals of care.   CODE STATUS: DNR  PPS: 40% HOSPICE ELIGIBILITY/DIAGNOSIS: TBD  PAST MEDICAL HISTORY:  Past Medical History:  Diagnosis Date  . Anginal pain ([place])   . Anxiety   . Arthritis    RA  . Asthma   . Brain tumor (benign) ([place])   . CHF (congestive heart failure) ([place])   . Collagen vascular disease ([place])   . Complete heart block ([place])   . COPD (chronic obstructive pulmonary disease) ([place])   . Coronary artery disease   . DDD (degenerative disc disease)   . Depression   . Diabetes mellitus   . GERD (gastroesophageal reflux disease)   . Headache   . Heart murmur   . Hypercholesteremia   . Hypertension   . Lumbar degenerative disc disease   . Migraines   . Obesity   . Osteopenia   . Osteoporosis   . Pacemaker- St Jude   . Pneumonia   . PTSD (post-traumatic stress disorder)     SOCIAL HX:  Social History   Tobacco Use  . Smoking status: Never Smoker  . Smokeless tobacco: Never Used  Substance Use Topics  . Alcohol use: No    Alcohol/week: 0.0 standard drinks    ALLERGIES:  Allergies  Allergen Reactions  . Amoxicillin Itching and Other (See Comments)    Has patient had a PCN reaction causing immediate rash, facial/tongue/throat swelling, SOB or lightheadedness with hypotension: Unknown Has patient had a PCN reaction causing severe rash involving mucus membranes or skin necrosis: Unknown Has patient had a PCN reaction that required hospitalization: Unknown Has patient had a PCN reaction occurring within the last 10 years: Unknown If all of the above answers are "NO", then may proceed with Cephalosporin use.   . Gabapentin Other (See Comments)    Pt states that it causes her BP to drop.   . Naproxen Hives  . Nsaids Other (See Comments)    Reaction:  Unknown   . Tolmetin Other (See Comments)    Reaction:  Unknown      PERTINENT MEDICATIONS:  Outpatient Encounter  Medications as of 04/12/2019  Medication Sig  . albuterol (VENTOLIN HFA) 108 (90 Base) MCG/ACT inhaler Inhale 1-2 puffs into the lungs every 6 (six) hours as needed for wheezing or shortness of breath.  . busPIRone (BUSPAR) 7.5 MG tablet Take 1 tablet by mouth 2 (two) times daily.  . Calcium Carb-Cholecalciferol (CALCIUM 500/D) 500-400 MG-UNIT CHEW Chew by mouth daily.  . Cholecalciferol 1.25 MG (50000 UT) TABS Take by mouth once a week.  . clonazePAM (KLONOPIN) 0.5 MG tablet Take 0.125 mg by mouth 2 (two) times daily.  . cyanocobalamin 500 MCG tablet Take 500 mcg by mouth daily.  . diclofenac sodium (VOLTAREN) 1 % GEL Apply 2 g topically 3 (three) times daily. Apply to the fingers  . escitalopram (LEXAPRO) 10 MG tablet Take 1 tablet (10 mg total) by mouth every morning. (Patient taking differently: Take 20 mg by mouth every morning. )  . fluticasone (VERAMYST) 27.5 MCG/SPRAY nasal spray Place 2 sprays into the nose 2 (two) times daily.  . folic acid (FOLVITE) 1 MG tablet Take 1 mg by mouth daily.  . furosemide (LASIX) 40 MG tablet Take 40 mg by mouth 2 (two) times daily.  . Ipratropium-Albuterol (  COMBIVENT RESPIMAT) 20-100 MCG/ACT AERS respimat Inhale 1 puff into the lungs every 6 (six) hours.  Marland Kitchen lamoTRIgine (LAMICTAL) 100 MG tablet Take 1 tablet (100 mg total) by mouth daily.  Marland Kitchen loperamide (IMODIUM A-D) 2 MG tablet Take 1 tablet (2 mg total) by mouth 4 (four) times daily as needed for diarrhea or loose stools. (Patient taking differently: Take 2 mg by mouth as needed for diarrhea or loose stools. )  . loratadine (CLARITIN) 10 MG tablet Take 10 mg by mouth daily.  . methotrexate (RHEUMATREX) 7.5 MG tablet Take 7.5 mg by mouth once a week. Caution" Chemotherapy. Protect from light.  . mirtazapine (REMERON) 7.5 MG tablet Take 7.5 mg by mouth at bedtime.   Marland Kitchen omeprazole (PRILOSEC) 20 MG capsule Take 20 mg by mouth daily.   Marland Kitchen oxyCODONE-acetaminophen (PERCOCET/ROXICET) 5-325 MG tablet Take 1 tablet by  mouth every 4 (four) hours as needed for moderate pain or severe pain.  Marland Kitchen oxyCODONE-acetaminophen (PERCOCET/ROXICET) 5-325 MG tablet Take 1 tablet by mouth 2 (two) times a day. scheduled  . Potassium Chloride ER 20 MEQ TBCR Take 20 mEq by mouth 2 (two) times daily.  . pramipexole (MIRAPEX) 0.25 MG tablet Take 0.25 mg by mouth daily.   . saxagliptin HCl (ONGLYZA) 5 MG TABS tablet Take 5 mg by mouth at bedtime.  . Tofacitinib Citrate 5 MG TABS Take 5 mg by mouth 2 (two) times daily after a meal.   . topiramate (TOPAMAX) 25 MG tablet Take 25 mg by mouth at bedtime.    No facility-administered encounter medications on file as of 04/12/2019.     PHYSICAL EXAM:   General: NAD, pleasant female Cardiovascular: regular rate and rhythm Pulmonary: clear ant fields Abdomen: soft, nontender, + bowel sounds Extremities: mild BLE edema, no joint deformities Neurological: Weakness but otherwise nonfocal  Modesta Sammons Ihor Gully, NP

## 2019-04-24 ENCOUNTER — Ambulatory Visit (INDEPENDENT_AMBULATORY_CARE_PROVIDER_SITE_OTHER): Payer: Medicare Other | Admitting: *Deleted

## 2019-04-24 DIAGNOSIS — R55 Syncope and collapse: Secondary | ICD-10-CM

## 2019-04-24 DIAGNOSIS — I442 Atrioventricular block, complete: Secondary | ICD-10-CM

## 2019-04-25 LAB — CUP PACEART REMOTE DEVICE CHECK
Battery Remaining Longevity: 101 mo
Battery Remaining Percentage: 95.5 %
Battery Voltage: 3.01 V
Brady Statistic AP VP Percent: 1 %
Brady Statistic AP VS Percent: 1 %
Brady Statistic AS VP Percent: 99 %
Brady Statistic AS VS Percent: 1 %
Brady Statistic RA Percent Paced: 1 %
Brady Statistic RV Percent Paced: 99 %
Date Time Interrogation Session: 20200915060013
Implantable Lead Implant Date: 20200313
Implantable Lead Implant Date: 20200313
Implantable Lead Location: 753859
Implantable Lead Location: 753860
Implantable Lead Model: 5076
Implantable Lead Model: 5076
Implantable Pulse Generator Implant Date: 20200313
Lead Channel Impedance Value: 410 Ohm
Lead Channel Impedance Value: 550 Ohm
Lead Channel Pacing Threshold Amplitude: 0.75 V
Lead Channel Pacing Threshold Amplitude: 0.75 V
Lead Channel Pacing Threshold Pulse Width: 0.5 ms
Lead Channel Pacing Threshold Pulse Width: 0.5 ms
Lead Channel Sensing Intrinsic Amplitude: 12 mV
Lead Channel Sensing Intrinsic Amplitude: 2.3 mV
Lead Channel Setting Pacing Amplitude: 2 V
Lead Channel Setting Pacing Amplitude: 2.5 V
Lead Channel Setting Pacing Pulse Width: 0.5 ms
Lead Channel Setting Sensing Sensitivity: 4 mV
Pulse Gen Model: 2272
Pulse Gen Serial Number: 9118930

## 2019-05-05 NOTE — Progress Notes (Signed)
Remote pacemaker transmission.   

## 2019-05-08 IMAGING — CR DG HIP (WITH OR WITHOUT PELVIS) 2-3V*L*
3 series · 3 of 3 positions shown · non-contrast
Comparison: None.

CLINICAL DATA: 71-year-old female with left hip pain.

EXAM:
DG HIP (WITH OR WITHOUT PELVIS) 2-3V LEFT

[pelvis ap]
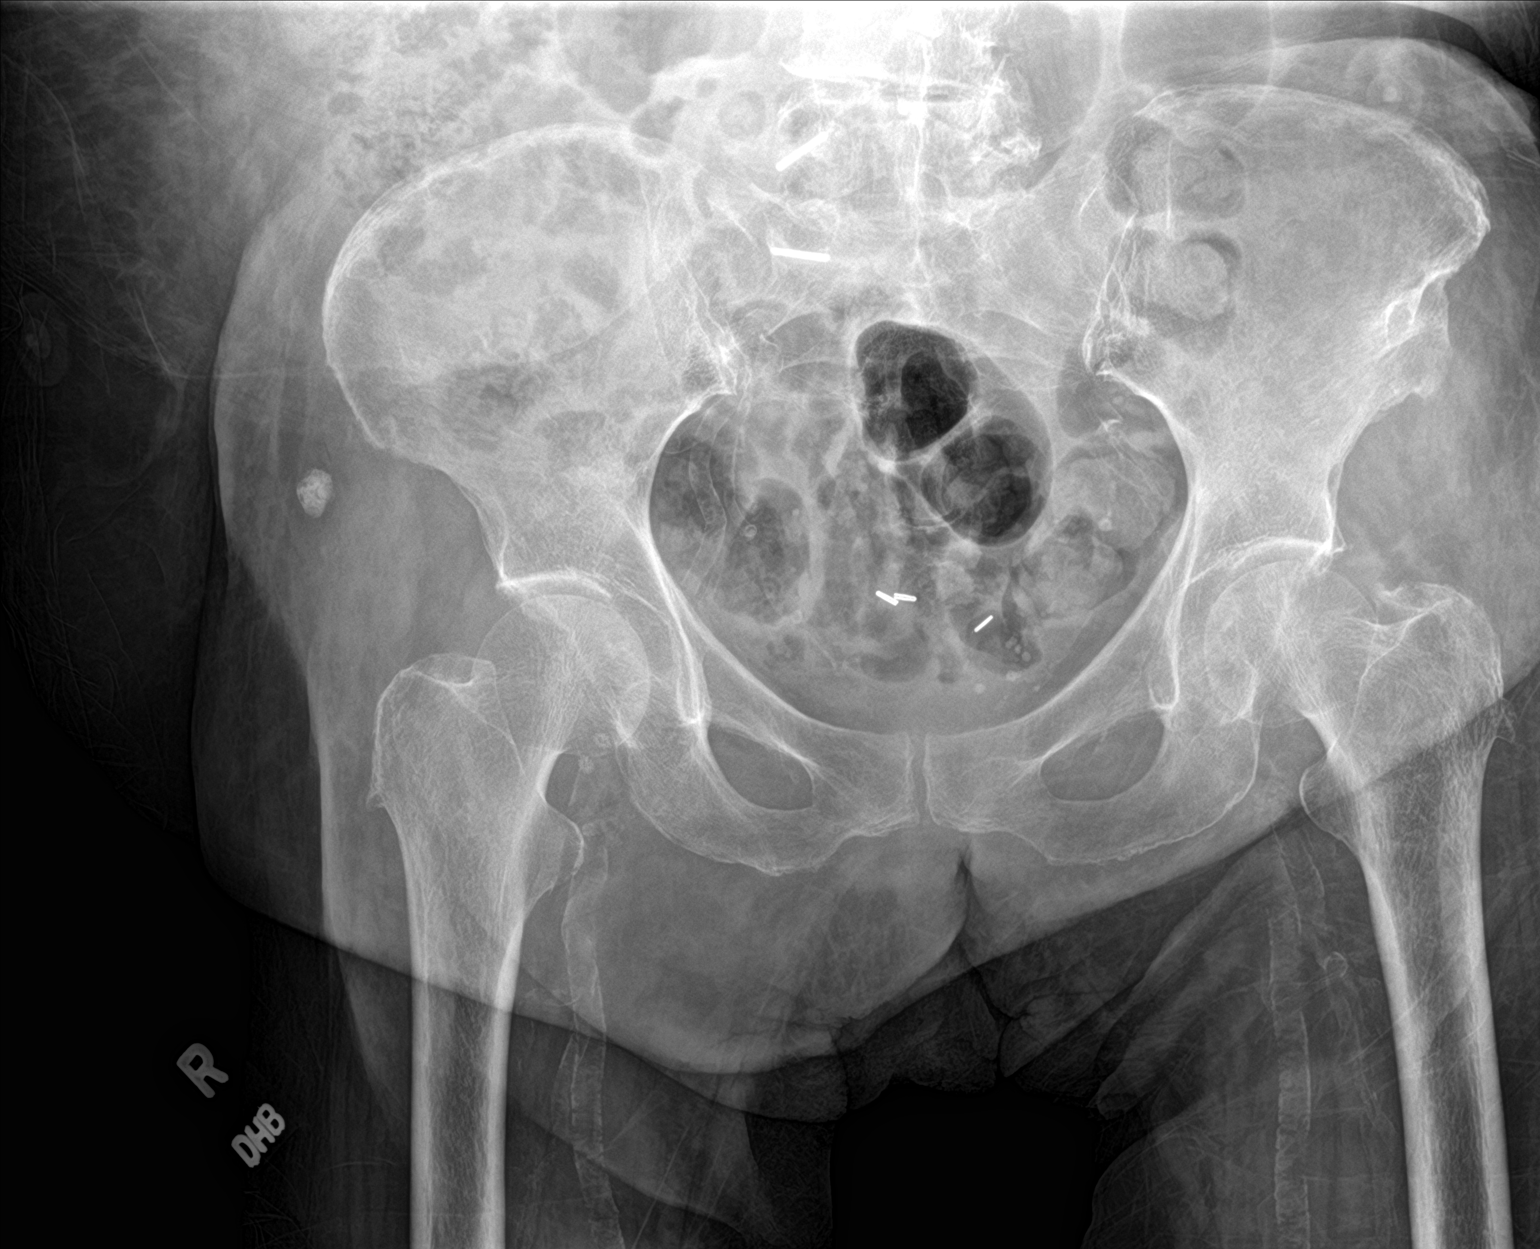

[hip ap]
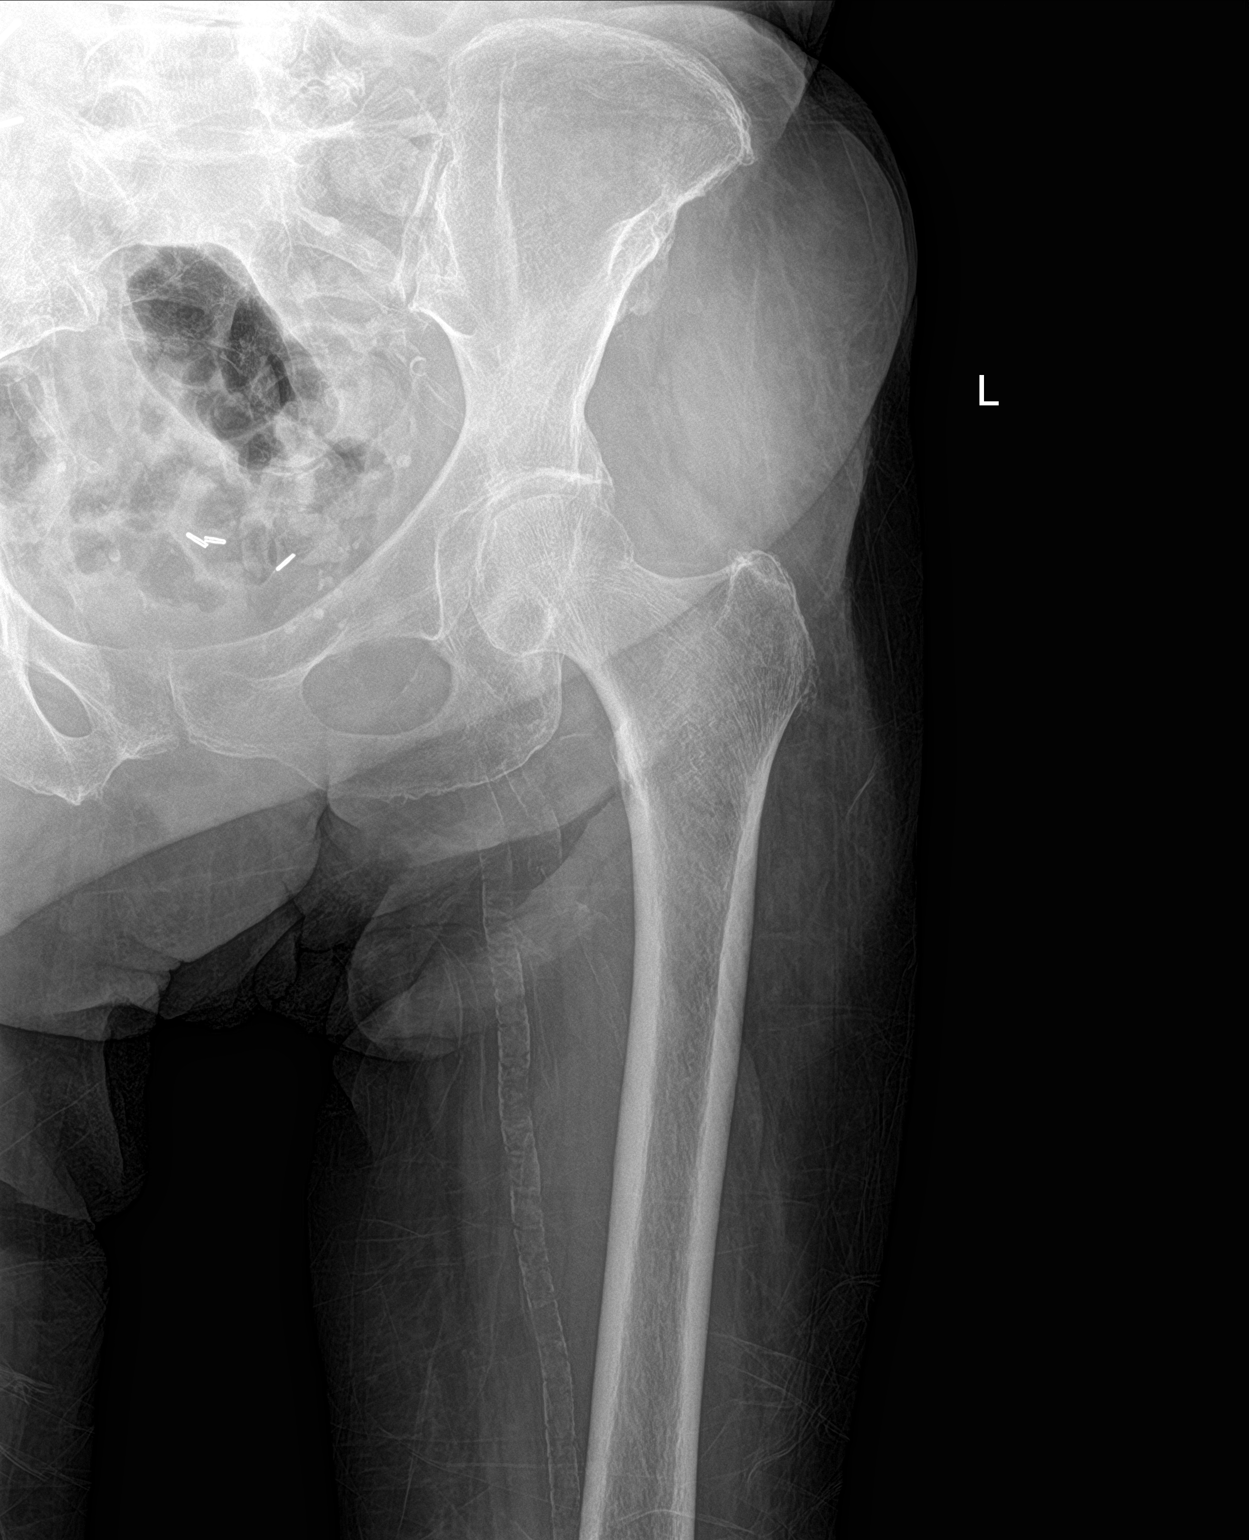

[hip lat]
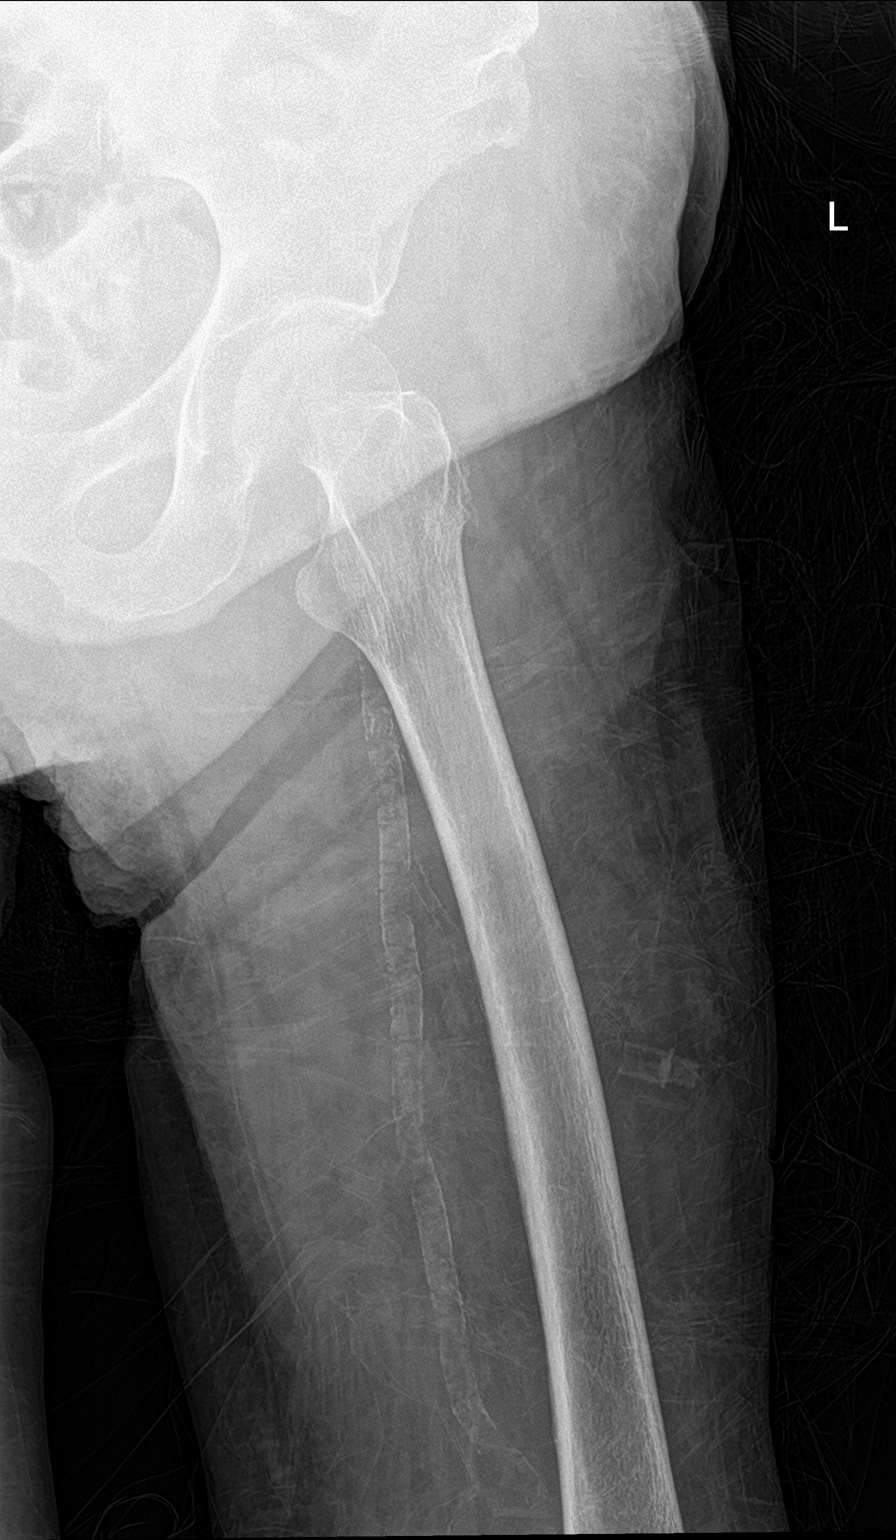

[3 of 3 positions shown; findings below may reference images not displayed]

FINDINGS: There is no fracture or dislocation. The bones are osteopenic.
Degenerative changes of the lower lumbar spine.

Vascular calcification and multiple surgical clips over the pelvis.
The soft tissues are grossly unremarkable.
IMPRESSION: No acute fracture or dislocation.

## 2019-05-08 IMAGING — CT CT HEAD W/O CM
4 series · 16 of 47 positions shown, 18 images · non-contrast
Comparison: 06/09/2017 CT head

CLINICAL DATA: 71 y/o F; altered mental status and possible urinary
tract infection.

EXAM:
CT HEAD WITHOUT CONTRAST
TECHNIQUE: Contiguous axial images were obtained from the base of the skull
through the vertex without intravenous contrast.

[Series 2: head wo · axial · 0.41mm/px · z∈[-112,-7]mm · 7 of 29 slices shown, 9 images]
[im 4/29  brain]
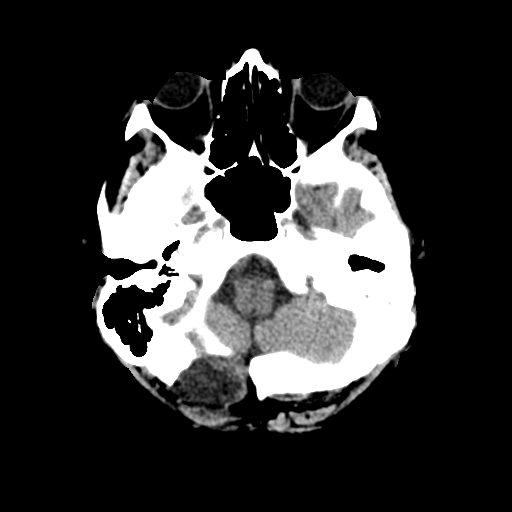
[im 4/29  bone]
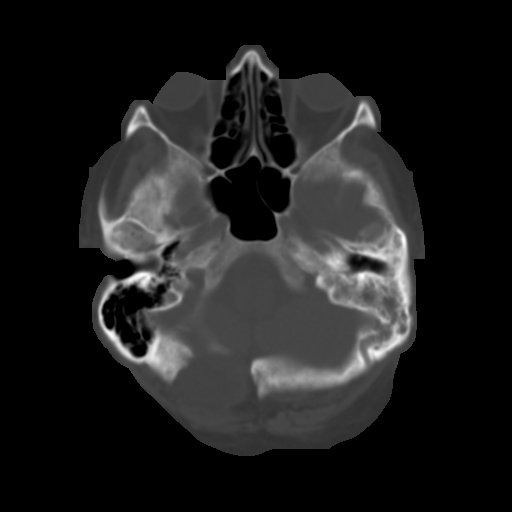
[im 8/29  brain]
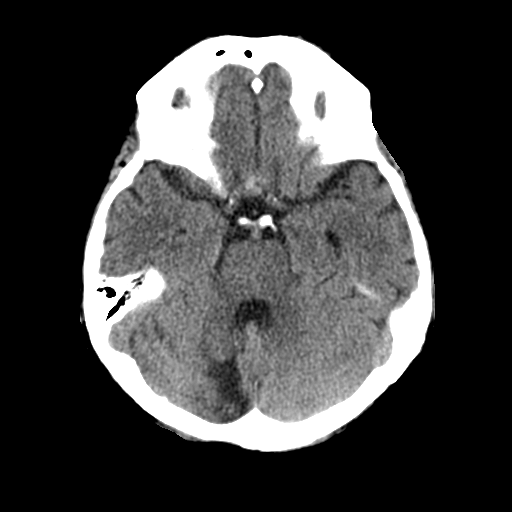
[im 11/29  brain]
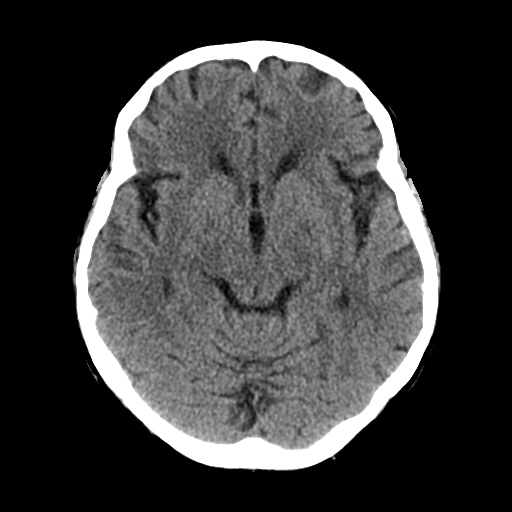
[im 15/29  brain]
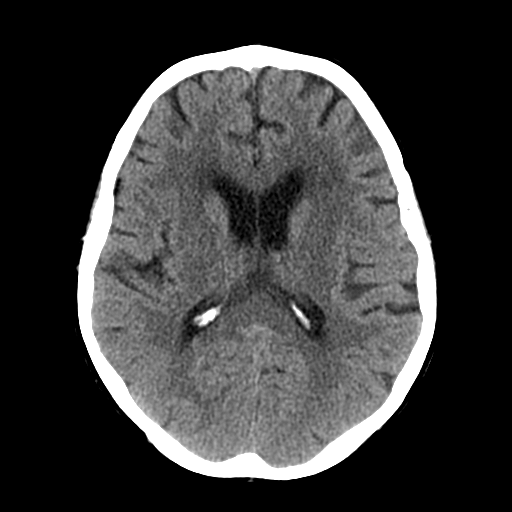
[im 18/29  brain]
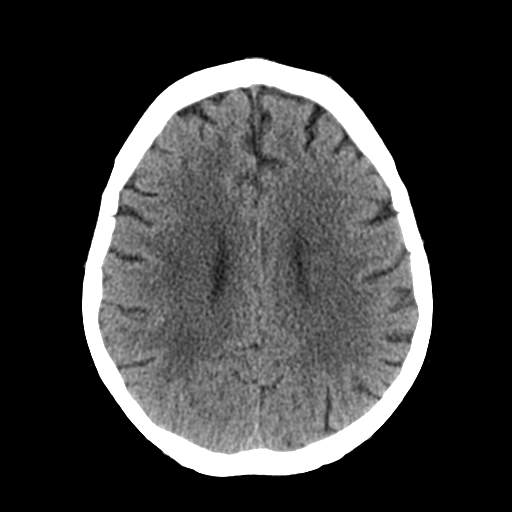
[im 18/29  bone]
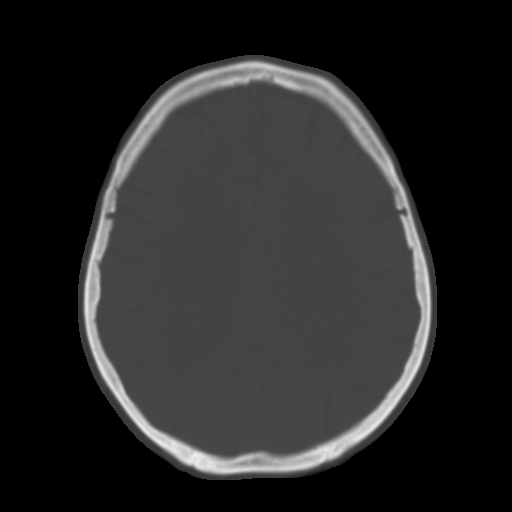
[im 22/29  brain]
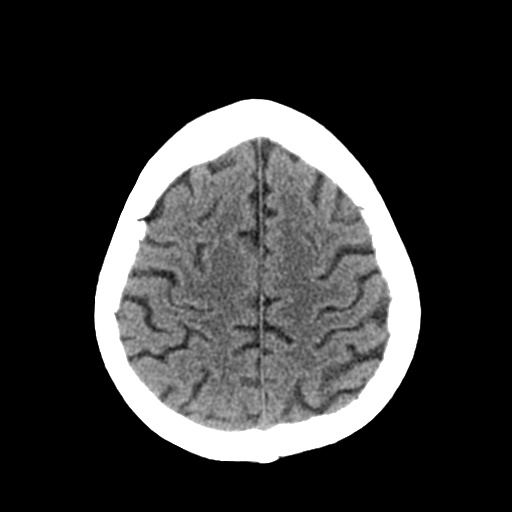
[im 25/29  brain]
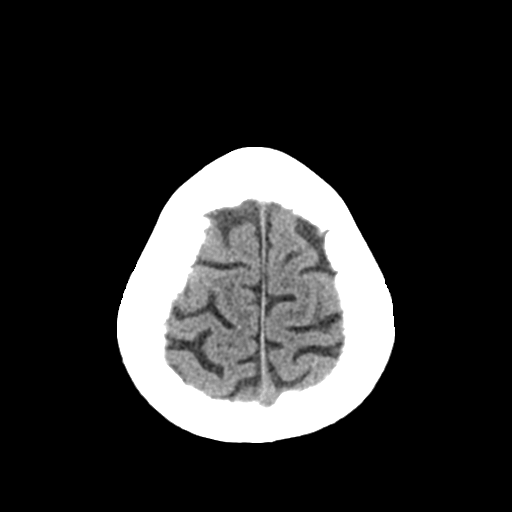

[Series 3: head bone · axial · 0.41mm/px · z∈[-113,-85]mm · 3 of 72 slices shown]
[im 8/72  bone]
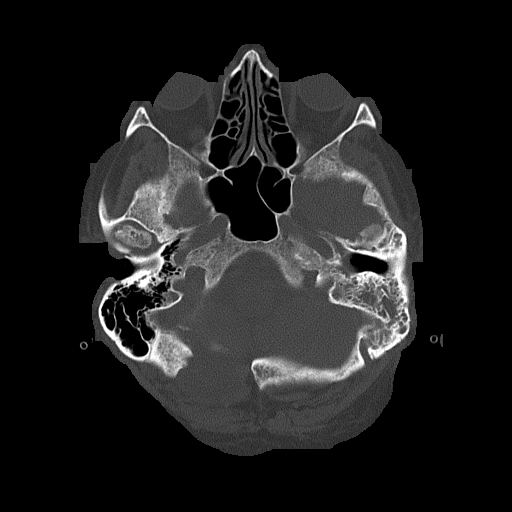
[im 15/72  bone]
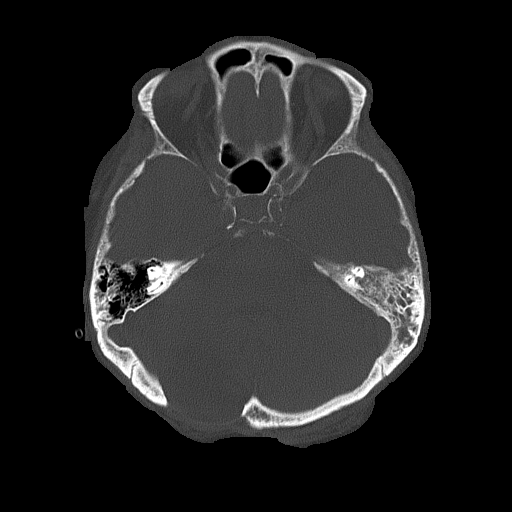
[im 22/72  bone]
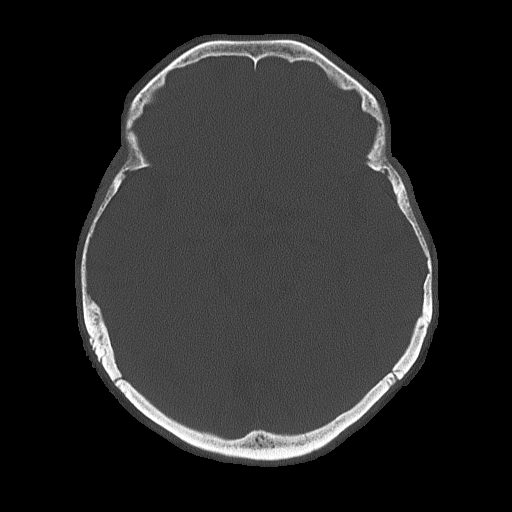

[Series 4: coronal soft tissue · coronal · 0.29mm/px · 3 of 63 slices shown]
[im 21/63  brain]
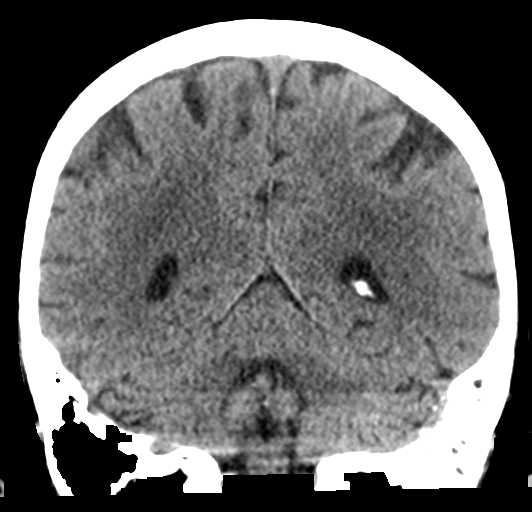
[im 28/63  brain]
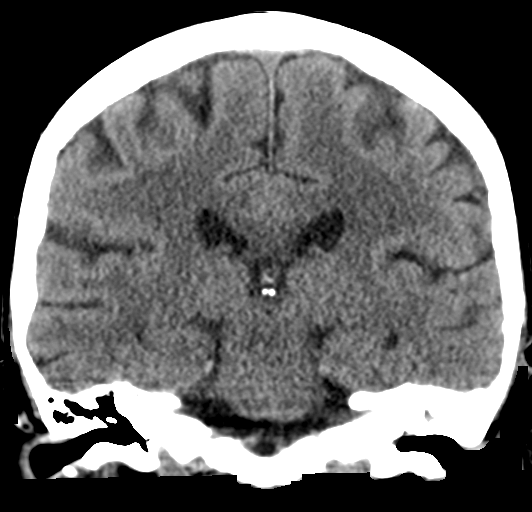
[im 35/63  brain]
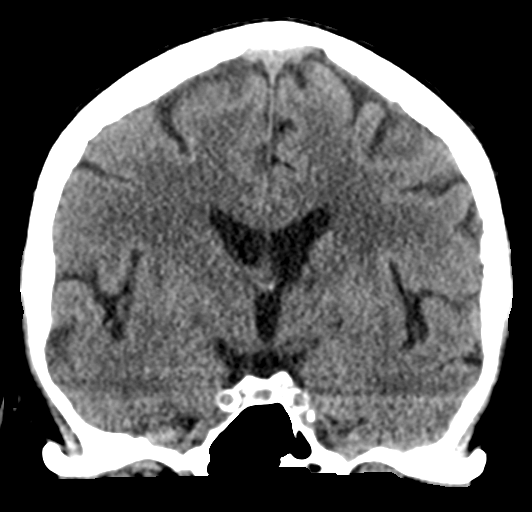

[Series 5: sagittal soft tissue · sagittal · 0.29mm/px · 3 of 53 slices shown]
[im 18/53  brain]
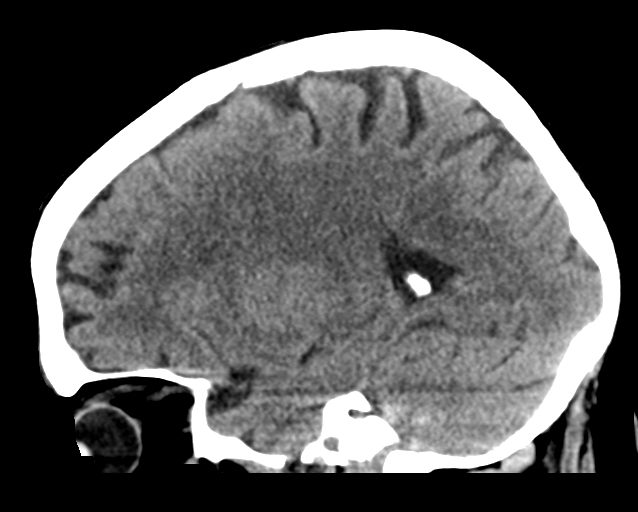
[im 27/53  brain]
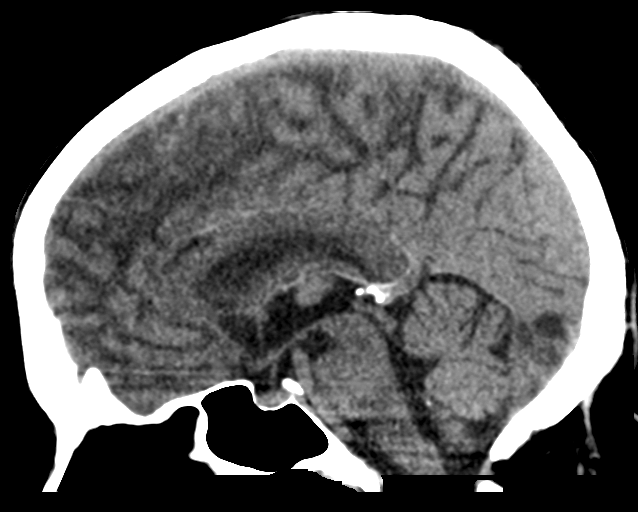
[im 35/53  brain]
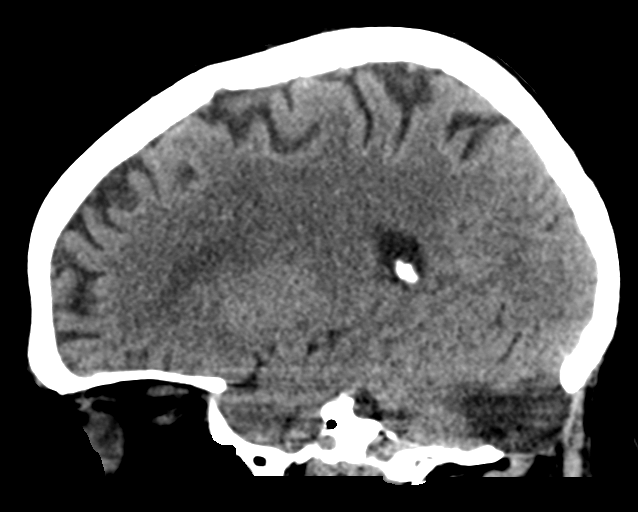

[16 of 47 positions shown; findings below may reference images not displayed]

FINDINGS: Brain: Stable encephalomalacia within the right cerebellar
hemisphere subjacent to craniotomy. Stable mild chronic
microvascular ischemic changes and parenchymal volume loss of the
brain. No findings for large acute stroke, hemorrhage, or focal mass
effect. No hydrocephalus, extra-axial collection, or effacement of
basilar cisterns.

Vascular: Calcific atherosclerosis of carotid siphons.

Skull: Stable chronic postsurgical changes related to right
suboccipital craniectomy.

Sinuses/Orbits: Increased opacification of the left mastoid air
cells. Stable minimal right mastoid opacification. Normal aeration
of visualized paranasal sinuses. Orbits are unremarkable.

Other: None.
IMPRESSION: 1. No acute intracranial abnormality identified.
2. Stable mild chronic microvascular ischemic changes and
parenchymal volume loss of the brain.
3. Stable postsurgical changes related to right suboccipital
craniotomy and right cerebellar hemisphere encephalomalacia.
4. Increased opacification of left mastoid air cells.

By: Bereket Diez M.D.

## 2019-05-09 IMAGING — CR DG CHEST 2V
2 series · 2 of 2 positions shown · non-contrast
Comparison: Chest radiograph dated 06/09/2017

CLINICAL DATA: 71-year-old female with cough and leukocytosis.
Altered mental status

EXAM:
CHEST  2 VIEW

[chest lat]
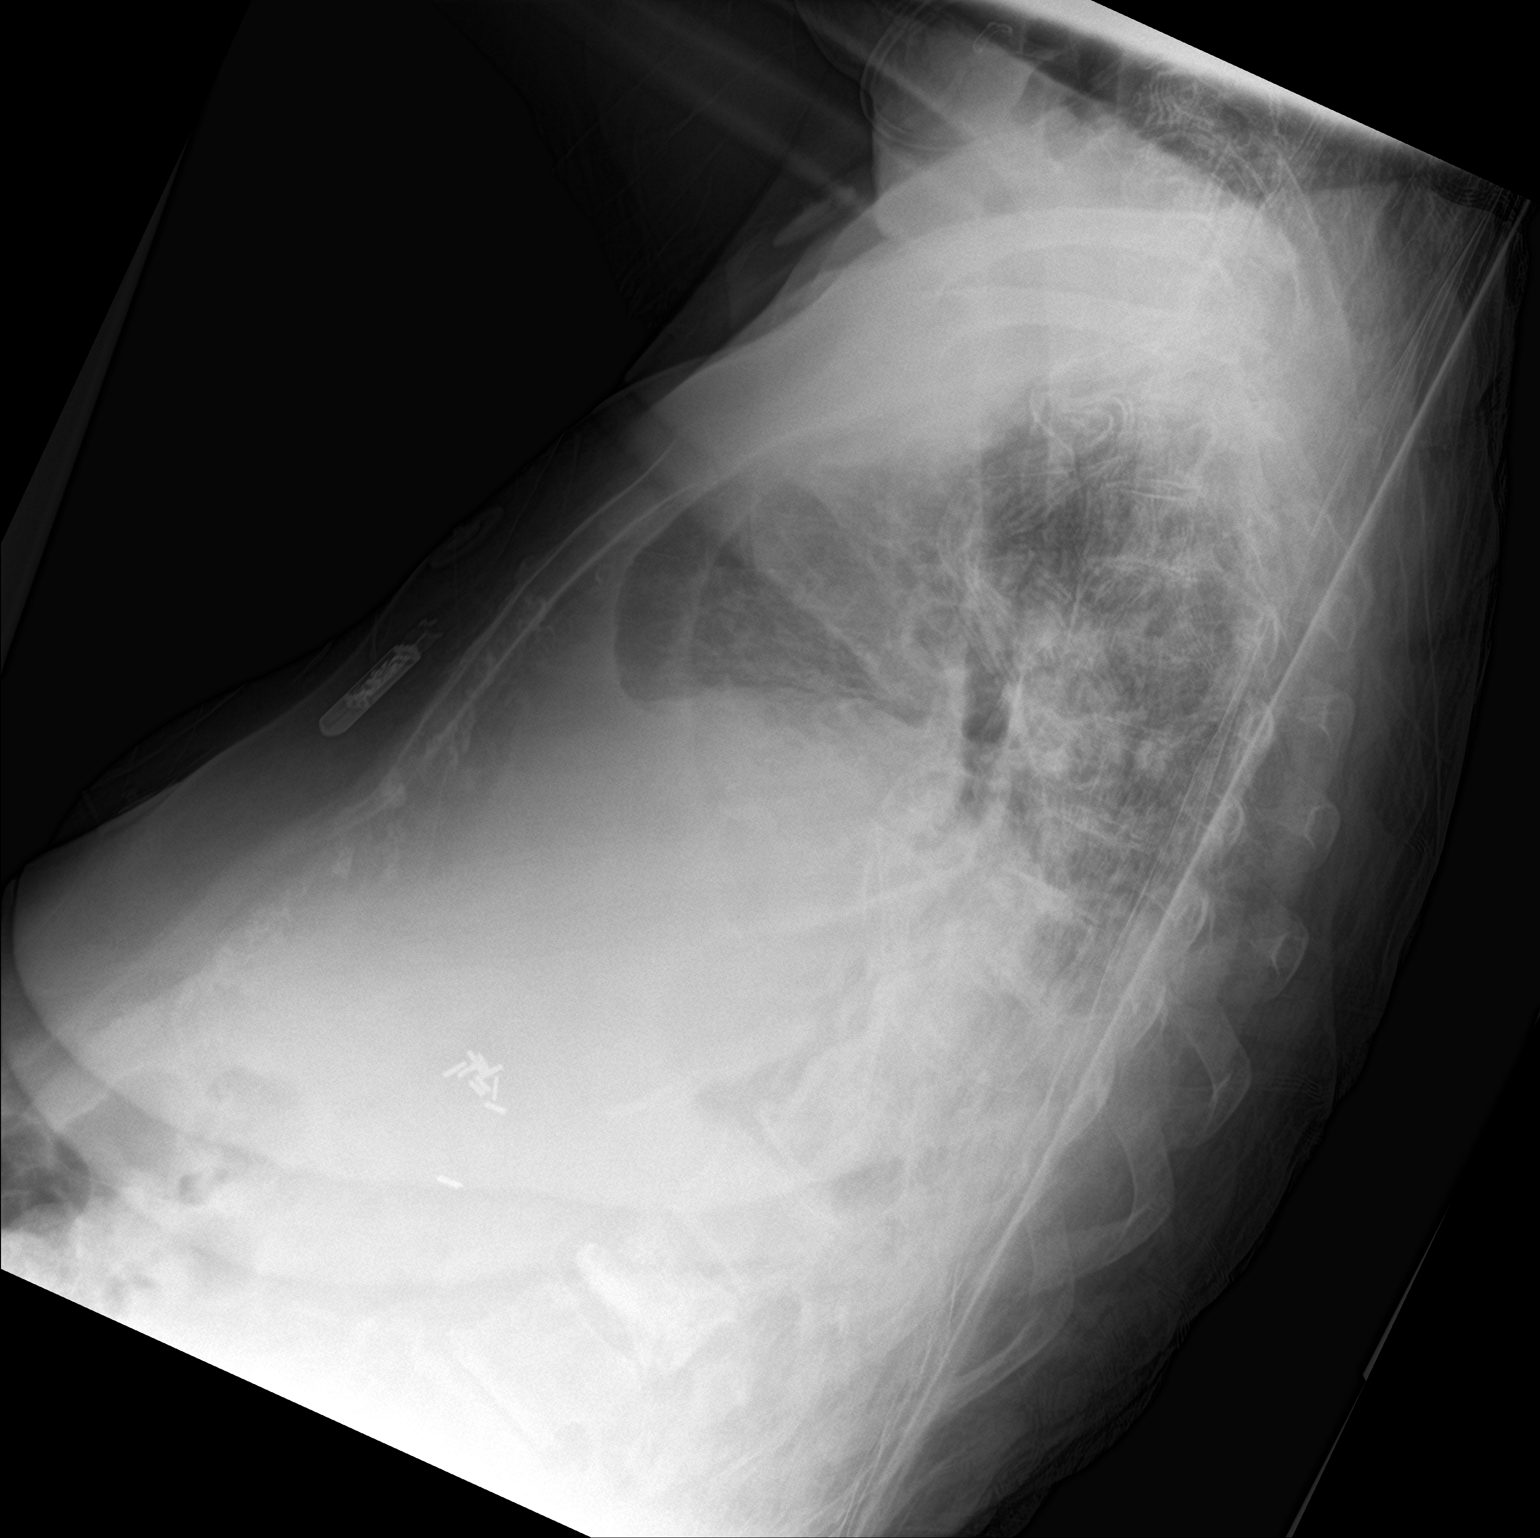

[chest ap]
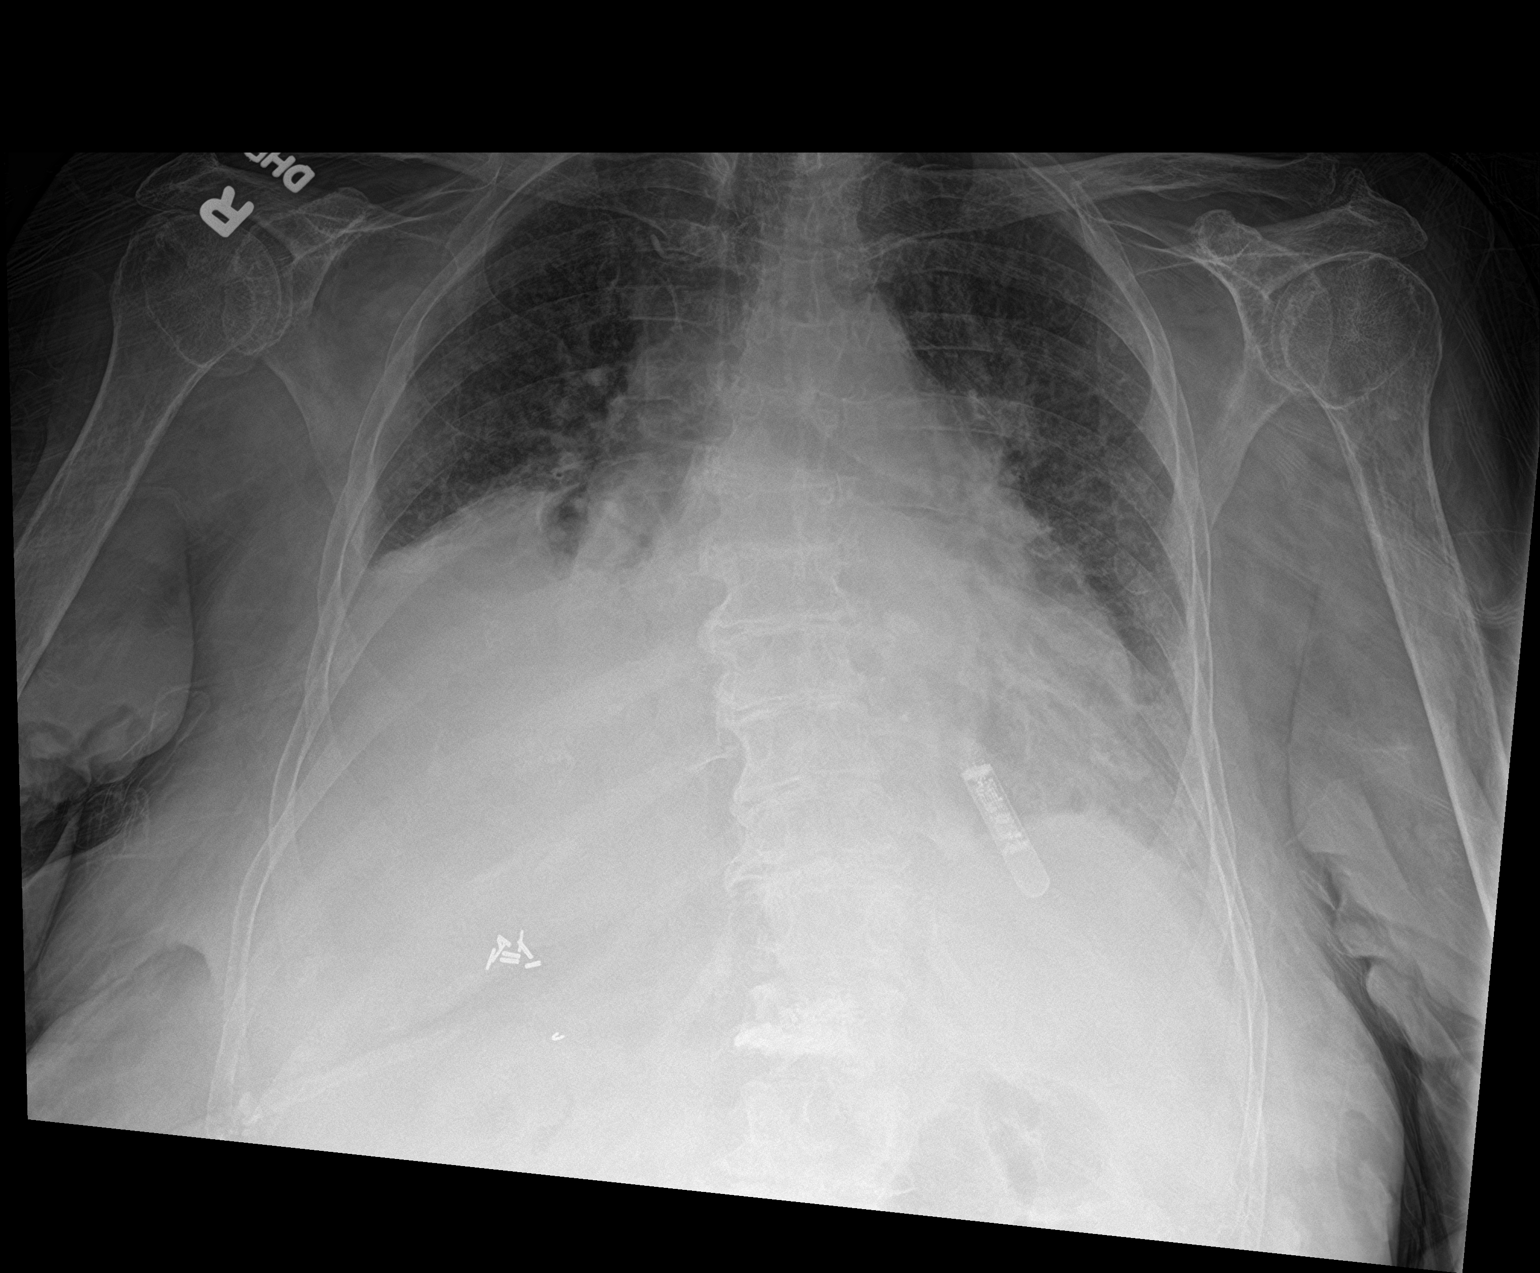

[2 of 2 positions shown; findings below may reference images not displayed]

FINDINGS: There is eventration of the right hemidiaphragm. Bibasilar
densities, increased compared to the prior radiograph, likely
atelectatic changes. Infiltrate is not excluded. Clinical
correlation is recommended. Trace pleural effusions may be present.
There is no pneumothorax. Stable top-normal cardiac size. A loop
recorder device is noted. Osteopenia with degenerative changes of
the spine. Upper lumbar vertebroplasty changes. Right upper quadrant
cholecystectomy clips.
IMPRESSION: Bibasilar densities increased from prior study, likely atelectatic
changes. Pneumonia not excluded. Clinical correlation is
recommended.

## 2019-06-09 ENCOUNTER — Non-Acute Institutional Stay: Payer: Medicare Other | Admitting: Nurse Practitioner

## 2019-06-09 ENCOUNTER — Encounter: Payer: Self-pay | Admitting: Nurse Practitioner

## 2019-06-09 ENCOUNTER — Other Ambulatory Visit: Payer: Self-pay

## 2019-06-09 DIAGNOSIS — Z515 Encounter for palliative care: Secondary | ICD-10-CM

## 2019-06-09 NOTE — Progress Notes (Signed)
[facility] Consult Note Telephone: [phone]  Fax: [phone]  [name] DOB: 11-06-1945 MRN: [id number]  PRIMARY CARE PROVIDER:   Dr Yves Dill PROVIDER:  Dr Hodges/[facility] RESPONSIBLE PARTY:   Self  1.ACP: Medical goals consists DNR, do not intubate. Wishes are for more conservative care to include antibiotics, IV fluids, blood transfusions, diagnostic testing, lab testing. Wishes are to minimize hospitalizations if necessary.  2.Dyspneic secondary to congestive heart failure remain stable at present time. Continue daily weights and outpatient Cardiology appointments  3.Generalized weakness secondary to CHFcontinue with therapy as able. Encourage energy conservation and rest times.  4. Palliative care encounter; Palliative medicine team will continue to support patient, patient's family, and medical team. Visit consisted of counseling and education dealing with the complex and emotionally intense issues of symptom management and palliative care in the setting of serious and potentially life-threatening illness  I spent  minutes providing this consultation,  from 11:45am to 12:30pm. More than 50% of the time in this consultation was spent coordinating communication.   HISTORY OF PRESENT ILLNESS:  Meghan [name] is a 73 y.o. year old female with multiple medical problems including COPD, congestive heart failure with ef 20%, asthma, collagen vascular disease, coronary artery disease, heart murmur, benign brain tumor, hypertension, gerd, hypercholesterolemia, history of headache, diabetes, gerd, arthritis, osteoporosis, lumbar degenerative disc disease, anxiety, PTSD, depression, kyphoplasty, right hand reconstruction 2011, cholecystectomy, cervical fusion, abdominal hysterectomy . Meghan [name] continues to reside at [place] at [facility] She does  require assistance with transferring as someday she is able to transfer to a wheelchair stand and pivot. She does require assistance with ADLs with her hands becoming more contracted. She feeds herself an appetite has been poor. Staff reports she's had chronic nausea in addition to chronic diarrhea. She is covet positive. She had increased and fatigue and weakness. At present Meghan [name] is sitting up in bed working on her computer game. At present Meghan [name] appears pale, chronically ill but no respiratory distress. No visitors present. I visited and observed Meghan [name]. We talked about purpose of palliative care visit and she was in agreement. We talked about how she was feeling today. She verbalize that she feels like she is getting a little better. Her strength is getting stronger but she still is sleeping a lot and doesn't feel good. Appetite remains poor in addition to chronic nausea. Meghan [name] talked about chronic nausea and diarrhea which has been for some time. We talked about antiemetic which has been effective to control nausea. We talked about covid symptoms and importance of rest. We talked about medical goals of care with focus on Com[place]. DNR does remain in place. Will continue antiemetic for nausea since effective. Discuss with Meghan [name] role of palliative care in plan of care. we talked about follow-up palliative care visit in 1 week or sooner should she declined. Meghan [name] in agreement as she is her own responsible party. Therapeutic listening and emotional support provided. Questions answered to satisfaction. I updated nursing staff in [place] practitioner no new changes to current goals were plan of care to continue to focus on Comfort, treat what is treatable at the facility  Palliative Care was asked to help to continue to address goals of care.   CODE STATUS: DNR  PPS: 40% HOSPICE ELIGIBILITY/DIAGNOSIS: TBD  PAST MEDICAL HISTORY:  Past Medical History:  Diagnosis Date  .  Anginal pain  ([place])   . Anxiety   . Arthritis    RA  . Asthma   . Brain tumor (benign) ([place])   . CHF (congestive heart failure) ([place])   . Collagen vascular disease ([place])   . Complete heart block ([place])   . COPD (chronic obstructive pulmonary disease) ([place])   . Coronary artery disease   . DDD (degenerative disc disease)   . Depression   . Diabetes mellitus   . GERD (gastroesophageal reflux disease)   . Headache   . Heart murmur   . Hypercholesteremia   . Hypertension   . Lumbar degenerative disc disease   . Migraines   . Obesity   . Osteopenia   . Osteoporosis   . Pacemaker- St Jude   . Pneumonia   . PTSD (post-traumatic stress disorder)     SOCIAL HX:  Social History   Tobacco Use  . Smoking status: Never Smoker  . Smokeless tobacco: Never Used  Substance Use Topics  . Alcohol use: No    Alcohol/week: 0.0 standard drinks    ALLERGIES:  Allergies  Allergen Reactions  . Amoxicillin Itching and Other (See Comments)    Has patient had a PCN reaction causing immediate rash, facial/tongue/throat swelling, SOB or lightheadedness with hypotension: Unknown Has patient had a PCN reaction causing severe rash involving mucus membranes or skin necrosis: Unknown Has patient had a PCN reaction that required hospitalization: Unknown Has patient had a PCN reaction occurring within the last 10 years: Unknown If all of the above answers are "NO", then may proceed with Cephalosporin use.   . Gabapentin Other (See Comments)    Pt states that it causes her BP to drop.   . Naproxen Hives  . Nsaids Other (See Comments)    Reaction:  Unknown   . Tolmetin Other (See Comments)    Reaction:  Unknown      PERTINENT MEDICATIONS:  Outpatient Encounter Medications as of 06/09/2019  Medication Sig  . albuterol (VENTOLIN HFA) 108 (90 Base) MCG/ACT inhaler Inhale 1-2 puffs into the lungs every 6 (six) hours as needed for wheezing or shortness of breath.  . busPIRone (BUSPAR) 7.5 MG tablet Take 1 tablet by  mouth 2 (two) times daily.  . Calcium Carb-Cholecalciferol (CALCIUM 500/D) 500-400 MG-UNIT CHEW Chew by mouth daily.  . Cholecalciferol 1.25 MG (50000 UT) TABS Take by mouth once a week.  . clonazePAM (KLONOPIN) 0.5 MG tablet Take 0.125 mg by mouth 2 (two) times daily.  . cyanocobalamin 500 MCG tablet Take 500 mcg by mouth daily.  . diclofenac sodium (VOLTAREN) 1 % GEL Apply 2 g topically 3 (three) times daily. Apply to the fingers  . escitalopram (LEXAPRO) 10 MG tablet Take 1 tablet (10 mg total) by mouth every morning. (Patient taking differently: Take 20 mg by mouth every morning. )  . fluticasone (VERAMYST) 27.5 MCG/SPRAY nasal spray Place 2 sprays into the nose 2 (two) times daily.  . folic acid (FOLVITE) 1 MG tablet Take 1 mg by mouth daily.  . furosemide (LASIX) 40 MG tablet Take 40 mg by mouth 2 (two) times daily.  . Ipratropium-Albuterol (COMBIVENT RESPIMAT) 20-100 MCG/ACT AERS respimat Inhale 1 puff into the lungs every 6 (six) hours.  Marland Kitchen lamoTRIgine (LAMICTAL) 100 MG tablet Take 1 tablet (100 mg total) by mouth daily.  Marland Kitchen loperamide (IMODIUM A-D) 2 MG tablet Take 1 tablet (2 mg total) by mouth 4 (four) times daily as needed for diarrhea or loose stools. (Patient taking  differently: Take 2 mg by mouth as needed for diarrhea or loose stools. )  . loratadine (CLARITIN) 10 MG tablet Take 10 mg by mouth daily.  . methotrexate (RHEUMATREX) 7.5 MG tablet Take 7.5 mg by mouth once a week. Caution" Chemotherapy. Protect from light.  . mirtazapine (REMERON) 7.5 MG tablet Take 7.5 mg by mouth at bedtime.   Marland Kitchen omeprazole (PRILOSEC) 20 MG capsule Take 20 mg by mouth daily.   Marland Kitchen oxyCODONE-acetaminophen (PERCOCET/ROXICET) 5-325 MG tablet Take 1 tablet by mouth every 4 (four) hours as needed for moderate pain or severe pain.  Marland Kitchen oxyCODONE-acetaminophen (PERCOCET/ROXICET) 5-325 MG tablet Take 1 tablet by mouth 2 (two) times a day. scheduled  . Potassium Chloride ER 20 MEQ TBCR Take 20 mEq by mouth 2 (two)  times daily.  . pramipexole (MIRAPEX) 0.25 MG tablet Take 0.25 mg by mouth daily.   . saxagliptin HCl (ONGLYZA) 5 MG TABS tablet Take 5 mg by mouth at bedtime.  . Tofacitinib Citrate 5 MG TABS Take 5 mg by mouth 2 (two) times daily after a meal.   . topiramate (TOPAMAX) 25 MG tablet Take 25 mg by mouth at bedtime.    No facility-administered encounter medications on file as of 06/09/2019.     PHYSICAL EXAM:   General: chronically ill, pale, weak, pleasant female Cardiovascular: regular rate and rhythm Pulmonary: decreased with few wheezes Abdomen: soft, nontender, + bowel sounds Extremities: + edema, no joint deformities, fingers contracted Neurological: Weakness but otherwise nonfocal  Shammond Arave Ihor Gully, NP

## 2019-07-07 ENCOUNTER — Other Ambulatory Visit: Payer: Self-pay

## 2019-07-07 ENCOUNTER — Non-Acute Institutional Stay: Payer: Medicare Other | Admitting: Nurse Practitioner

## 2019-07-07 ENCOUNTER — Encounter: Payer: Self-pay | Admitting: Nurse Practitioner

## 2019-07-07 VITALS — BP 119/58 | HR 82 | Temp 97.5°F | Resp 20 | Wt 141.4 lb

## 2019-07-07 DIAGNOSIS — Z515 Encounter for palliative care: Secondary | ICD-10-CM

## 2019-07-07 DIAGNOSIS — I509 Heart failure, unspecified: Secondary | ICD-10-CM
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
## 2019-07-24 ENCOUNTER — Ambulatory Visit (INDEPENDENT_AMBULATORY_CARE_PROVIDER_SITE_OTHER): Payer: Medicare Other | Admitting: *Deleted

## 2019-07-24 DIAGNOSIS — I442 Atrioventricular block, complete: Secondary | ICD-10-CM | POA: Diagnosis not present

## 2019-07-24 LAB — CUP PACEART REMOTE DEVICE CHECK
Battery Remaining Longevity: 100 mo
Battery Remaining Percentage: 95.5 %
Battery Voltage: 2.99 V
Brady Statistic AP VP Percent: 1 %
Brady Statistic AP VS Percent: 1 %
Brady Statistic AS VP Percent: 99 %
Brady Statistic AS VS Percent: 1 %
Brady Statistic RA Percent Paced: 1 %
Brady Statistic RV Percent Paced: 99 %
Date Time Interrogation Session: 20201214020012
Implantable Lead Implant Date: 20200313
Implantable Lead Implant Date: 20200313
Implantable Lead Location: 753859
Implantable Lead Location: 753860
Implantable Lead Model: 5076
Implantable Lead Model: 5076
Implantable Pulse Generator Implant Date: 20200313
Lead Channel Impedance Value: 450 Ohm
Lead Channel Impedance Value: 580 Ohm
Lead Channel Pacing Threshold Amplitude: 0.75 V
Lead Channel Pacing Threshold Amplitude: 0.75 V
Lead Channel Pacing Threshold Pulse Width: 0.5 ms
Lead Channel Pacing Threshold Pulse Width: 0.5 ms
Lead Channel Sensing Intrinsic Amplitude: 12 mV
Lead Channel Sensing Intrinsic Amplitude: 2.7 mV
Lead Channel Setting Pacing Amplitude: 2 V
Lead Channel Setting Pacing Amplitude: 2.5 V
Lead Channel Setting Pacing Pulse Width: 0.5 ms
Lead Channel Setting Sensing Sensitivity: 4 mV
Pulse Gen Model: 2272
Pulse Gen Serial Number: 9118930

## 2019-08-23 ENCOUNTER — Encounter: Payer: Self-pay | Admitting: Nurse Practitioner

## 2019-08-23 ENCOUNTER — Non-Acute Institutional Stay: Payer: Medicare Other | Admitting: Nurse Practitioner

## 2019-08-23 ENCOUNTER — Other Ambulatory Visit: Payer: Self-pay

## 2019-08-23 VITALS — BP 118/56 | HR 82 | Temp 98.5°F | Resp 18 | Wt 154.6 lb

## 2019-08-23 DIAGNOSIS — I509 Heart failure, unspecified: Secondary | ICD-10-CM

## 2019-08-23 DIAGNOSIS — Z515 Encounter for palliative care: Secondary | ICD-10-CM

## 2019-08-23 NOTE — Progress Notes (Signed)
[facility] Consult Note Telephone: [phone]  Fax: [phone]  [name] DOB: [birth year] MRN: [id number] PRIMARY CARE PROVIDER:Dr Yves Dill PROVIDER:Dr Hodges/[place] PARTY:Self  1.[id number] goals consists DNR, do not intubate. Wishes are for more conservative care to include antibiotics, IV fluids, blood transfusions, diagnostic testing, lab testing. Wishes are to minimize hospitalizations if necessary.  2.Dyspneic secondary to congestive heart failure remain stable at present time. Continue daily weights and outpatient Cardiology appointments  3.Generalized weakness secondary to CHFcontinue with therapy as able. Encourage energy conservation and rest times.  4.Palliative care encounter; Palliative medicine team will continue to support patient, patient's family, and medical team. Visit consisted of counseling and education dealing with the complex and emotionally intense issues of symptom management and palliative care in the setting of serious and potentially life-threatening illness  I spent 45 minutes providing this consultation,  from 11:00am to 11:45am. More than 50% of the time in this consultation was spent coordinating communication.   HISTORY OF PRESENT ILLNESS:  Meghan [name] is a 74 y.o. year old female with multiple medical problems including COPD, congestive heart failure with ef 20%, asthma, collagen vascular disease, coronary artery disease, heart murmur, benign brain tumor, hypertension, gerd, hypercholesterolemia, history of headache, diabetes, gerd, arthritis, osteoporosis, lumbar degenerative disc disease, anxiety, PTSD, depression, kyphoplasty, right hand reconstruction 2011, cholecystectomy, cervical fusion, abdominal hysterectomy. Ms. Lightle continues to reside at [facility] in [facility]. Ms. Ihrig  is able to ambulate with her walker in her room, transfer and perform adl's. Ms. Schram feeds herself the difficulty with contractures and appetite has been fair. Staff endorses Ms. Ferdig continues to have intermittent nausea with vomiting and diarrhea several times a month but this is been chronic over years. No recent Falls, hospitalizations, wounds. Ms. Reviere was seen by Psychiatry 12 / 17 / 2020 for bipolar disorder, anxiety and PTSD. No changes at this visit. Ms. Allaway continues with Psychotherapy. Ms. Contant last primary provider visit 12 / 24 / 2020 for follow-up bipolar. GDR are done on clonazepam 12 / 22 / 2022 decompensated noted and restarted. Staff endorses Ms. Kirkham mood seems to be better as she is had multiple recent losses and has been grieving everything a roommate to covid, having covet herself and challenges. At present Ms Pavlicek is lying in bed. Ms Cate appears comfortable. No visitors present. I visited and observed Ms Lander. We talked about purpose of palliative care visit and Ms Wilkerson in agreement. We talked about how she was feeling today. Ms Holleman endorses this morning she was nauseous but was able to eat some fruit loops which helps. We talked about chronic nausea, intermittent vomiting with diarrhea episodes that occur a few times a month. We talked about foods, appetite. Ms. Meskimen endorses it seems to be getting better and she's hopeful to eat lunch. Ms. Penuelas talked about getting the vaccine for covet. We talked about recent loss and grieving. We talked about challenges residing in facility and social isolation. We talked about medical goals to focus on Comfort. DNR is already in place. Ms Garcia was cooperative with assessment. Ms. Pastran appears stable at present time currently not using oxygen. Discussed will follow up in two months if needed or sooner should she declined. I have dated nursing staff and any changes to current goals are plan of care. Palliative Care was asked to help to  continue to address goals  of care.   CODE STATUS: DNR  PPS: 50% HOSPICE ELIGIBILITY/DIAGNOSIS: TBD  PAST MEDICAL HISTORY:  Past Medical History:  Diagnosis Date  . Anginal pain ([place])   . Anxiety   . Arthritis    RA  . Asthma   . Brain tumor (benign) ([place])   . CHF (congestive heart failure) ([place])   . Collagen vascular disease ([place])   . Complete heart block ([place])   . COPD (chronic obstructive pulmonary disease) ([place])   . Coronary artery disease   . DDD (degenerative disc disease)   . Depression   . Diabetes mellitus   . GERD (gastroesophageal reflux disease)   . Headache   . Heart murmur   . Hypercholesteremia   . Hypertension   . Lumbar degenerative disc disease   . Migraines   . Obesity   . Osteopenia   . Osteoporosis   . Pacemaker- St Jude   . Pneumonia   . PTSD (post-traumatic stress disorder)     SOCIAL HX:  Social History   Tobacco Use  . Smoking status: Never Smoker  . Smokeless tobacco: Never Used  Substance Use Topics  . Alcohol use: No    Alcohol/week: 0.0 standard drinks    ALLERGIES:  Allergies  Allergen Reactions  . Amoxicillin Itching and Other (See Comments)    Has patient had a PCN reaction causing immediate rash, facial/tongue/throat swelling, SOB or lightheadedness with hypotension: Unknown Has patient had a PCN reaction causing severe rash involving mucus membranes or skin necrosis: Unknown Has patient had a PCN reaction that required hospitalization: Unknown Has patient had a PCN reaction occurring within the last 10 years: Unknown If all of the above answers are "NO", then may proceed with Cephalosporin use.   . Gabapentin Other (See Comments)    Pt states that it causes her BP to drop.   . Naproxen Hives  . Nsaids Other (See Comments)    Reaction:  Unknown   . Tolmetin Other (See Comments)    Reaction:  Unknown      PERTINENT MEDICATIONS:  Outpatient Encounter Medications as of 08/23/2019  Medication Sig  . albuterol (VENTOLIN  HFA) 108 (90 Base) MCG/ACT inhaler Inhale 1-2 puffs into the lungs every 6 (six) hours as needed for wheezing or shortness of breath.  . busPIRone (BUSPAR) 7.5 MG tablet Take 1 tablet by mouth 2 (two) times daily.  . Calcium Carb-Cholecalciferol (CALCIUM 500/D) 500-400 MG-UNIT CHEW Chew by mouth daily.  . Cholecalciferol 1.25 MG (50000 UT) TABS Take by mouth once a week.  . clonazePAM (KLONOPIN) 0.5 MG tablet Take 0.125 mg by mouth 2 (two) times daily.  . cyanocobalamin 500 MCG tablet Take 500 mcg by mouth daily.  . diclofenac sodium (VOLTAREN) 1 % GEL Apply 2 g topically 3 (three) times daily. Apply to the fingers  . escitalopram (LEXAPRO) 10 MG tablet Take 1 tablet (10 mg total) by mouth every morning. (Patient taking differently: Take 20 mg by mouth every morning. )  . fluticasone (VERAMYST) 27.5 MCG/SPRAY nasal spray Place 2 sprays into the nose 2 (two) times daily.  . folic acid (FOLVITE) 1 MG tablet Take 1 mg by mouth daily.  . furosemide (LASIX) 40 MG tablet Take 40 mg by mouth 2 (two) times daily.  . Ipratropium-Albuterol (COMBIVENT RESPIMAT) 20-100 MCG/ACT AERS respimat Inhale 1 puff into the lungs every 6 (six) hours.  Marland Kitchen lamoTRIgine (LAMICTAL) 100 MG tablet Take 1 tablet (100 mg total) by mouth daily.  Marland Kitchen loperamide (IMODIUM  A-D) 2 MG tablet Take 1 tablet (2 mg total) by mouth 4 (four) times daily as needed for diarrhea or loose stools. (Patient taking differently: Take 2 mg by mouth as needed for diarrhea or loose stools. )  . loratadine (CLARITIN) 10 MG tablet Take 10 mg by mouth daily.  . methotrexate (RHEUMATREX) 7.5 MG tablet Take 7.5 mg by mouth once a week. Caution" Chemotherapy. Protect from light.  . mirtazapine (REMERON) 7.5 MG tablet Take 7.5 mg by mouth at bedtime.   Marland Kitchen omeprazole (PRILOSEC) 20 MG capsule Take 20 mg by mouth daily.   Marland Kitchen oxyCODONE-acetaminophen (PERCOCET/ROXICET) 5-325 MG tablet Take 1 tablet by mouth every 4 (four) hours as needed for moderate pain or severe  pain.  Marland Kitchen oxyCODONE-acetaminophen (PERCOCET/ROXICET) 5-325 MG tablet Take 1 tablet by mouth 2 (two) times a day. scheduled  . Potassium Chloride ER 20 MEQ TBCR Take 20 mEq by mouth 2 (two) times daily.  . pramipexole (MIRAPEX) 0.25 MG tablet Take 0.25 mg by mouth daily.   . saxagliptin HCl (ONGLYZA) 5 MG TABS tablet Take 5 mg by mouth at bedtime.  . Tofacitinib Citrate 5 MG TABS Take 5 mg by mouth 2 (two) times daily after a meal.   . topiramate (TOPAMAX) 25 MG tablet Take 25 mg by mouth at bedtime.    No facility-administered encounter medications on file as of 08/23/2019.    PHYSICAL EXAM:   General: NAD, chronically ill, pleasant female Cardiovascular: regular rate and rhythm Pulmonary: clear ant fields Abdomen: soft, nontender, + bowel sounds Extremities: no edema, + joint deformities Neurological: ambulatory with walker  Kaiyon Hynes Ihor Gully, NP

## 2019-08-26 NOTE — Progress Notes (Signed)
PPM remote 

## 2019-09-29 ENCOUNTER — Non-Acute Institutional Stay: Payer: Medicare Other | Admitting: Nurse Practitioner

## 2019-09-29 ENCOUNTER — Encounter: Payer: Self-pay | Admitting: Nurse Practitioner

## 2019-09-29 ENCOUNTER — Other Ambulatory Visit: Payer: Self-pay

## 2019-09-29 DIAGNOSIS — I509 Heart failure, unspecified: Secondary | ICD-10-CM

## 2019-09-29 DIAGNOSIS — Z515 Encounter for palliative care: Secondary | ICD-10-CM

## 2019-09-29 NOTE — Progress Notes (Signed)
[facility] Consult Note Telephone: [phone]  Fax: [phone]  [name] DOB: Jul 03, 1946 MRN: :[id number]  PRIMARY CARE PROVIDER:Dr Yves Dill PROVIDER:Dr Hodges/[place] PARTY:Self  1.RS:[id number] goals consists DNR, do not intubate. Wishes are for more conservative care to include antibiotics, IV fluids, blood transfusions, diagnostic testing, lab testing. Wishes are to minimize hospitalizations if necessary.  2.Dyspneic secondary to congestive heart failure remain stable at present time. Continue daily weights and outpatient Cardiology appointments  3.Generalized weakness secondary to CHFcontinue with therapy as able. Encourage energy conservation and rest times.  4.Palliative care encounter; Palliative medicine team will continue to support patient, patient's family, and medical team. Visit consisted of counseling and education dealing with the complex and emotionally intense issues of symptom management and palliative care in the setting of serious and potentially life-threatening illness  I spent 40 minutes providing this consultation,  from 10:35am to 11:10am. More than 50% of the time in this consultation was spent coordinating communication.   HISTORY OF PRESENT ILLNESS:  Meghan [name] is a 74 y.o. year old female with multiple medical problems including COPD, congestive heart failure with ef 20%, asthma, collagen vascular disease, coronary artery disease, heart murmur, benign brain tumor, hypertension, gerd, hypercholesterolemia, history of headache, diabetes, gerd, arthritis, osteoporosis, lumbar degenerative disc disease, anxiety, PTSD, depression, kyphoplasty, right hand reconstruction 2011, cholecystectomy, cervical fusion, abdominal hysterectomy.  [name] continues to reside in [place] at [facility]. Ms  Lathon does transfer herself, perform some adl's and toilet herself. [name]'s feed herself and appetite has been Fair. No recent falls, wounds, infections, hospitalizations. [name] does continue to take hydrocodone 5/325mg  bid scheduled for chronic pain, rheumatoid arthritis. Staff endorses no new changes or concerns. [name] did get a new roommate. At present Ms. Verrett is lying in bed, appears debilitative with contractures of her hands but comfortable. I visited and observe [name]. We talked about purpose of palliative care visit. [name] in agreement. [name] endorses that she continues to have intermittent diarrhea. Ms. Talluto endorses is frustrating though today has improved. Ms. Gammill talked about residing at [place], loss of [place], challenges with social isolation secondary to covet. We talked about symptoms for which [name] does take Oxycodone with relief for rheumatoid arthritis. We talked about her appetite with intermittent nausea but currently stable. [name] talked about her new roommate. We talked about coping strategies. We talked about medical goals of care as she has her own responsible party. No new changes to current plan or goals of care. Will follow up in one month if needed or sooner should she declined. Ms. Echeverry in agreement. Therapeutic listening and emotional support provided. Questions answer. I have updated nursing staff Palliative Care was asked to help to continue to address goals of care.   CODE STATUS: DNR  PPS: 50% HOSPICE ELIGIBILITY/DIAGNOSIS: TBD  PAST MEDICAL HISTORY:  Past Medical History:  Diagnosis Date  . Anginal pain ([place])   . Anxiety   . Arthritis    RA  . Asthma   . Brain tumor (benign) ([place])   . CHF (congestive heart failure) ([place])   . Collagen vascular disease ([place])   . Complete heart block ([place])   . COPD (chronic obstructive pulmonary disease) ([place])   . Coronary artery disease   . DDD (degenerative  disc disease)   . Depression   .  Diabetes mellitus   . GERD (gastroesophageal reflux disease)   . Headache   . Heart murmur   . Hypercholesteremia   . Hypertension   . Lumbar degenerative disc disease   . Migraines   . Obesity   . Osteopenia   . Osteoporosis   . Pacemaker- St Jude   . Pneumonia   . PTSD (post-traumatic stress disorder)     SOCIAL HX:  Social History   Tobacco Use  . Smoking status: Never Smoker  . Smokeless tobacco: Never Used  Substance Use Topics  . Alcohol use: No    Alcohol/week: 0.0 standard drinks    ALLERGIES:  Allergies  Allergen Reactions  . Amoxicillin Itching and Other (See Comments)    Has patient had a PCN reaction causing immediate rash, facial/tongue/throat swelling, SOB or lightheadedness with hypotension: Unknown Has patient had a PCN reaction causing severe rash involving mucus membranes or skin necrosis: Unknown Has patient had a PCN reaction that required hospitalization: Unknown Has patient had a PCN reaction occurring within the last 10 years: Unknown If all of the above answers are "NO", then may proceed with Cephalosporin use.   . Gabapentin Other (See Comments)    Pt states that it causes her BP to drop.   . Naproxen Hives  . Nsaids Other (See Comments)    Reaction:  Unknown   . Tolmetin Other (See Comments)    Reaction:  Unknown      PERTINENT MEDICATIONS:  Outpatient Encounter Medications as of 09/29/2019  Medication Sig  . albuterol (VENTOLIN HFA) 108 (90 Base) MCG/ACT inhaler Inhale 1-2 puffs into the lungs every 6 (six) hours as needed for wheezing or shortness of breath.  . busPIRone (BUSPAR) 7.5 MG tablet Take 1 tablet by mouth 2 (two) times daily.  . Calcium Carb-Cholecalciferol (CALCIUM 500/D) 500-400 MG-UNIT CHEW Chew by mouth daily.  . Cholecalciferol 1.25 MG (50000 UT) TABS Take by mouth once a week.  . clonazePAM (KLONOPIN) 0.5 MG tablet Take 0.125 mg by mouth 2 (two) times daily.  . cyanocobalamin 500 MCG  tablet Take 500 mcg by mouth daily.  . diclofenac sodium (VOLTAREN) 1 % GEL Apply 2 g topically 3 (three) times daily. Apply to the fingers  . escitalopram (LEXAPRO) 10 MG tablet Take 1 tablet (10 mg total) by mouth every morning. (Patient taking differently: Take 20 mg by mouth every morning. )  . fluticasone (VERAMYST) 27.5 MCG/SPRAY nasal spray Place 2 sprays into the nose 2 (two) times daily.  . folic acid (FOLVITE) 1 MG tablet Take 1 mg by mouth daily.  . furosemide (LASIX) 40 MG tablet Take 40 mg by mouth 2 (two) times daily.  . Ipratropium-Albuterol (COMBIVENT RESPIMAT) 20-100 MCG/ACT AERS respimat Inhale 1 puff into the lungs every 6 (six) hours.  Marland Kitchen lamoTRIgine (LAMICTAL) 100 MG tablet Take 1 tablet (100 mg total) by mouth daily.  Marland Kitchen loperamide (IMODIUM A-D) 2 MG tablet Take 1 tablet (2 mg total) by mouth 4 (four) times daily as needed for diarrhea or loose stools. (Patient taking differently: Take 2 mg by mouth as needed for diarrhea or loose stools. )  . loratadine (CLARITIN) 10 MG tablet Take 10 mg by mouth daily.  . methotrexate (RHEUMATREX) 7.5 MG tablet Take 7.5 mg by mouth once a week. Caution" Chemotherapy. Protect from light.  . mirtazapine (REMERON) 7.5 MG tablet Take 7.5 mg by mouth at bedtime.   Marland Kitchen omeprazole (PRILOSEC) 20 MG capsule Take 20 mg by mouth daily.   Marland Kitchen  oxyCODONE-acetaminophen (PERCOCET/ROXICET) 5-325 MG tablet Take 1 tablet by mouth every 4 (four) hours as needed for moderate pain or severe pain.  Marland Kitchen oxyCODONE-acetaminophen (PERCOCET/ROXICET) 5-325 MG tablet Take 1 tablet by mouth 2 (two) times a day. scheduled  . Potassium Chloride ER 20 MEQ TBCR Take 20 mEq by mouth 2 (two) times daily.  . pramipexole (MIRAPEX) 0.25 MG tablet Take 0.25 mg by mouth daily.   . saxagliptin HCl (ONGLYZA) 5 MG TABS tablet Take 5 mg by mouth at bedtime.  . Tofacitinib Citrate 5 MG TABS Take 5 mg by mouth 2 (two) times daily after a meal.   . topiramate (TOPAMAX) 25 MG tablet Take 25 mg by  mouth at bedtime.    No facility-administered encounter medications on file as of 09/29/2019.    PHYSICAL EXAM:   General: NAD, pleasant female Cardiovascular: regular rate and rhythm Pulmonary: clear ant fields Abdomen: soft, nontender, + bowel sounds Extremities: mild BLE edema, no joint deformities Neurological: generalized weakness Fitzhugh Vizcarrondo Ihor Gully, NP

## 2019-10-23 ENCOUNTER — Ambulatory Visit (INDEPENDENT_AMBULATORY_CARE_PROVIDER_SITE_OTHER): Payer: Medicare Other | Admitting: *Deleted

## 2019-10-23 DIAGNOSIS — I442 Atrioventricular block, complete: Secondary | ICD-10-CM | POA: Diagnosis not present

## 2019-10-23 LAB — CUP PACEART REMOTE DEVICE CHECK
Battery Remaining Longevity: 106 mo
Battery Remaining Percentage: 95.5 %
Battery Voltage: 2.99 V
Brady Statistic AP VP Percent: 1 %
Brady Statistic AP VS Percent: 1 %
Brady Statistic AS VP Percent: 99 %
Brady Statistic AS VS Percent: 1 %
Brady Statistic RA Percent Paced: 1 %
Brady Statistic RV Percent Paced: 99 %
Date Time Interrogation Session: 20210315041126
Implantable Lead Implant Date: 20200313
Implantable Lead Implant Date: 20200313
Implantable Lead Location: 753859
Implantable Lead Location: 753860
Implantable Lead Model: 5076
Implantable Lead Model: 5076
Implantable Pulse Generator Implant Date: 20200313
Lead Channel Impedance Value: 450 Ohm
Lead Channel Impedance Value: 550 Ohm
Lead Channel Pacing Threshold Amplitude: 0.75 V
Lead Channel Pacing Threshold Amplitude: 0.75 V
Lead Channel Pacing Threshold Pulse Width: 0.5 ms
Lead Channel Pacing Threshold Pulse Width: 0.5 ms
Lead Channel Sensing Intrinsic Amplitude: 12 mV
Lead Channel Sensing Intrinsic Amplitude: 2.9 mV
Lead Channel Setting Pacing Amplitude: 2 V
Lead Channel Setting Pacing Amplitude: 2.5 V
Lead Channel Setting Pacing Pulse Width: 0.5 ms
Lead Channel Setting Sensing Sensitivity: 4 mV
Pulse Gen Model: 2272
Pulse Gen Serial Number: 9118930

## 2019-10-23 NOTE — Progress Notes (Signed)
PPM Remote  

## 2019-11-24 IMAGING — DX DG HUMERUS 2V *R*
3 series · 3 of 3 positions shown · non-contrast
Comparison: 02/29/2016

CLINICAL DATA: Right hip pain after fall from bed.

EXAM:
RIGHT HUMERUS - 2+ VIEW

[humerus ap]
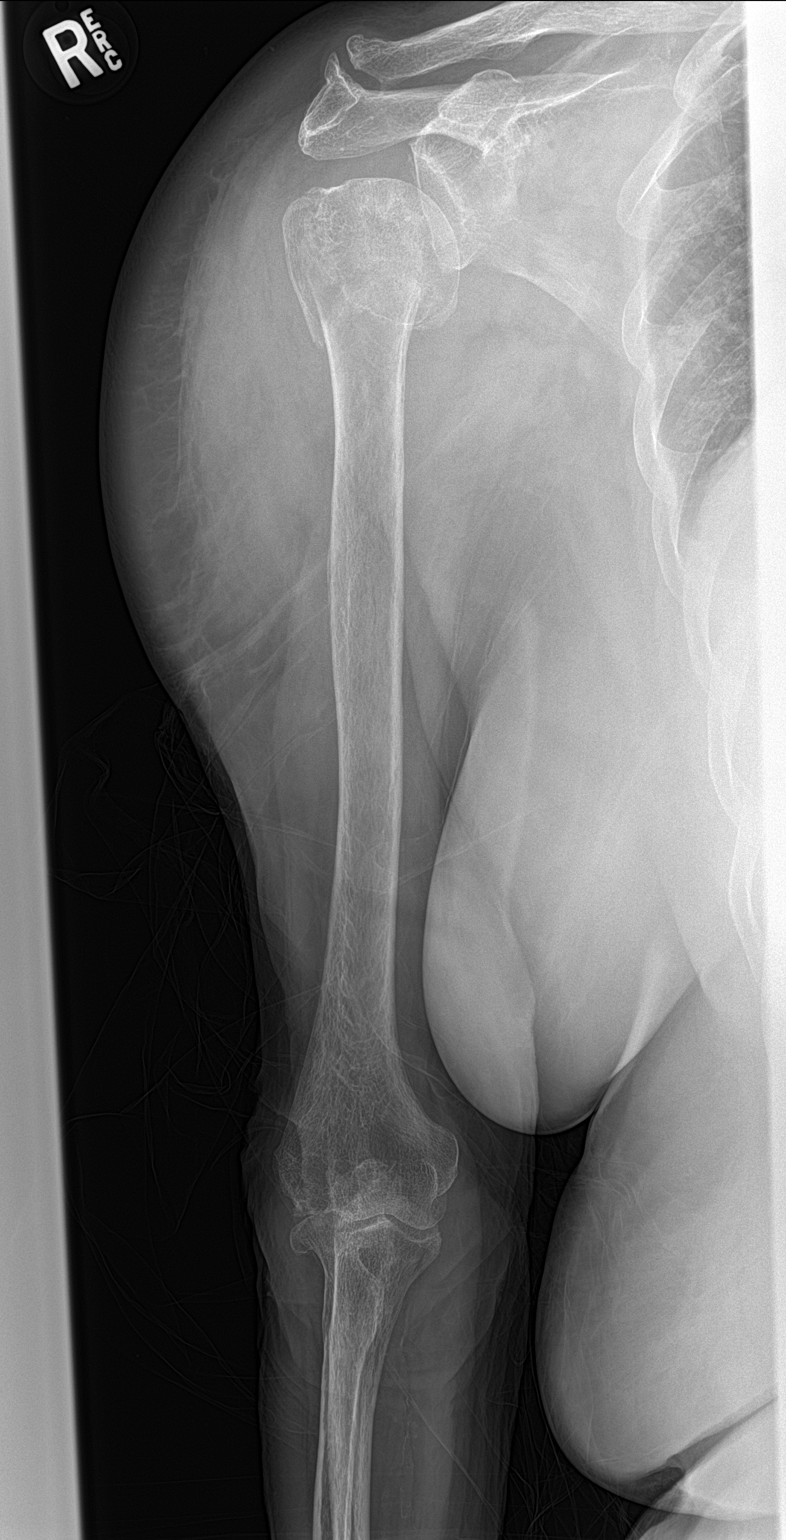

[humerus lat (1 of 2)]
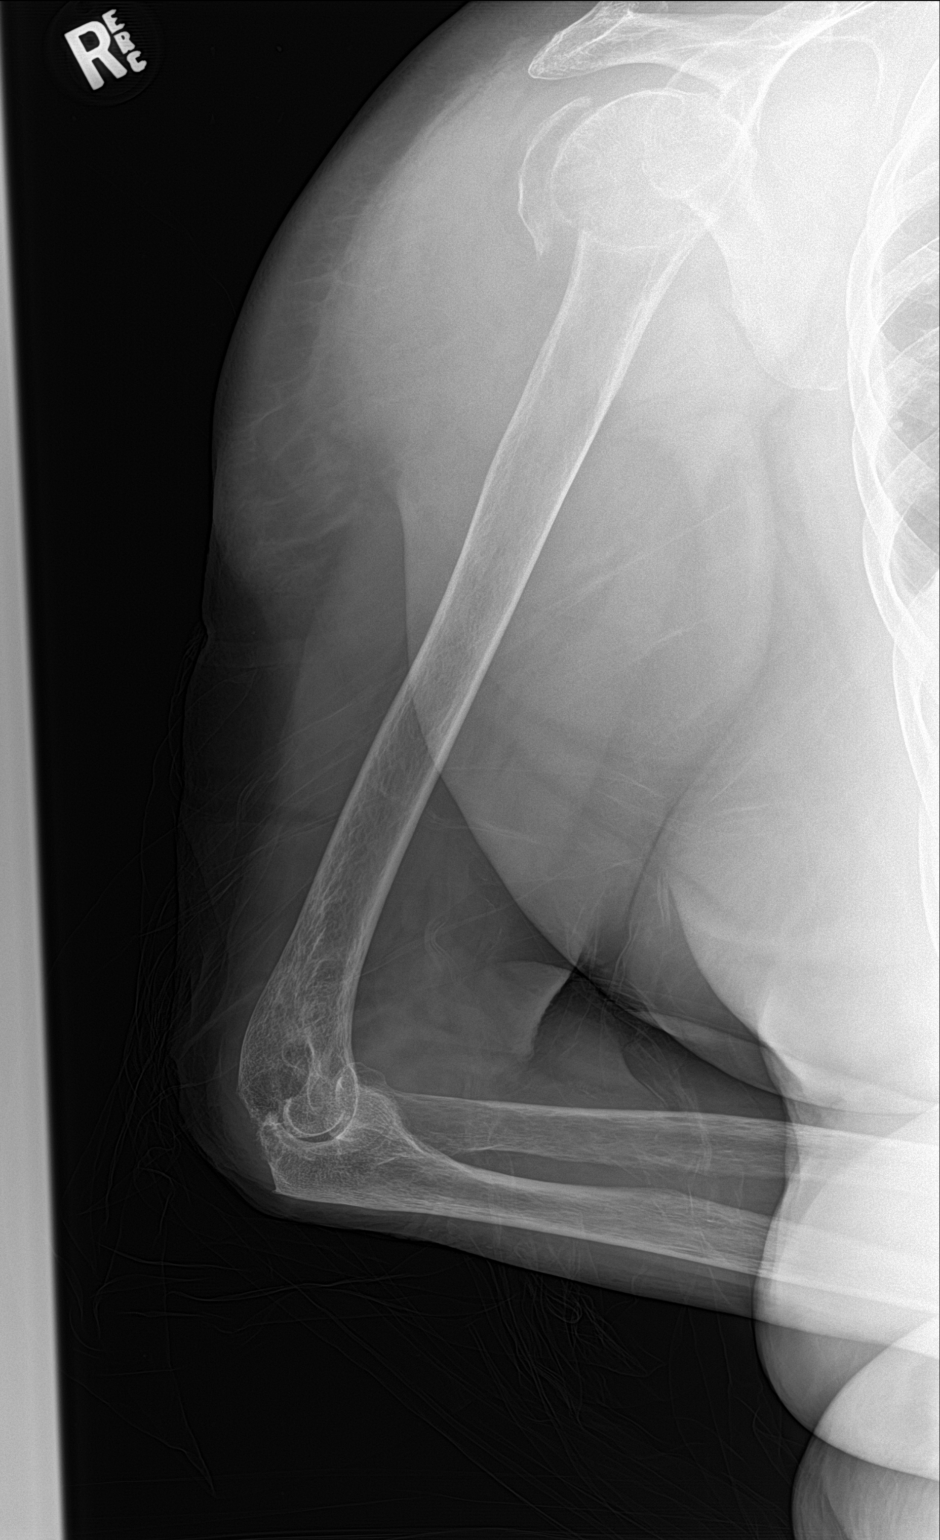

[humerus lat (2 of 2)]
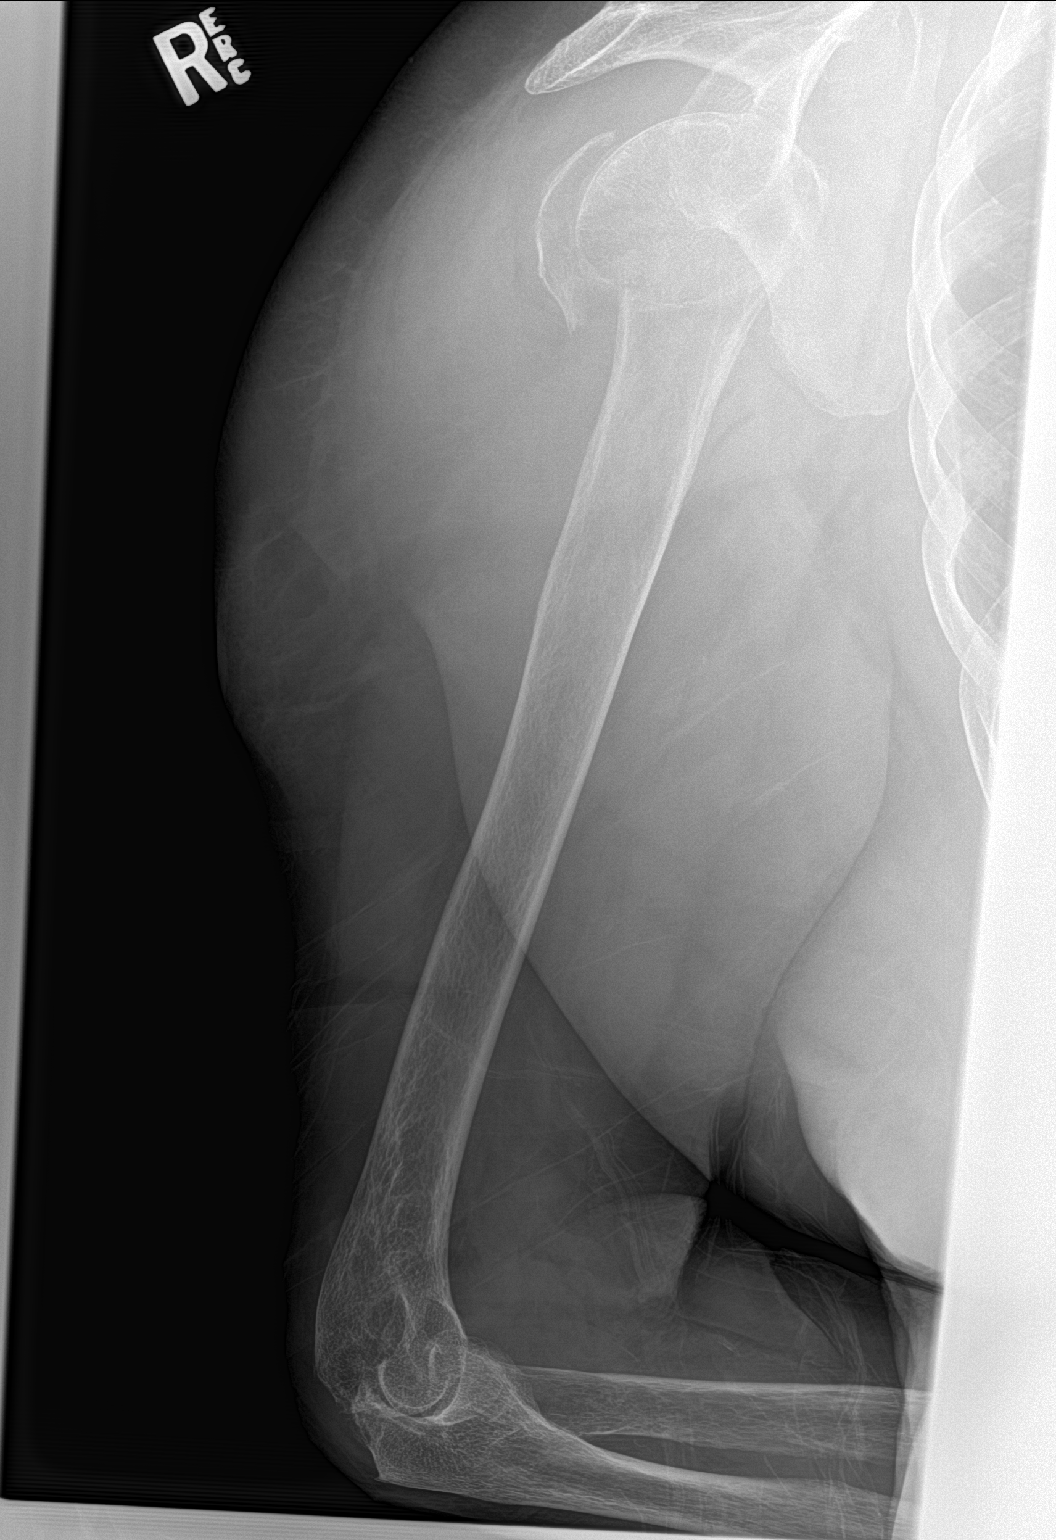

[3 of 3 positions shown; findings below may reference images not displayed]

FINDINGS: Soft tissue swelling and induration of the deltoid muscle overlying
an acute comminuted fracture of the proximal right humerus. Fracture
involves the surgical neck of the humerus with slight impaction upon
the humeral head as well as a displaced greater tuberosity fracture
displaced up to 1 cm.

Osteoarthritis of the acromioclavicular joint without dislocation.

Slight caudal subluxation of the humeral head relative to the
glenoid without dislocation.
IMPRESSION: Acute, comminuted Neer category 2 part fracture of the proximal
right humerus involving the greater tuberosity and surgical neck as
above. No joint dislocation. Overlying soft tissue swelling of the
deltoid and subcutaneous soft tissues.

## 2019-11-24 IMAGING — CT CT HEAD W/O CM
3 series · 15 of 47 positions shown, 18 images · non-contrast
Comparison: 08/25/2017

CLINICAL DATA: Patient fell out of bed this morning. History of
brain tumor.

EXAM:
CT HEAD WITHOUT CONTRAST
TECHNIQUE: Contiguous axial images were obtained from the base of the skull
through the vertex without intravenous contrast.

[Series 2: head wo · axial · 0.39mm/px · z∈[+8,+133]mm · 9 of 31 slices shown, 12 images]
[im 3/31  brain]
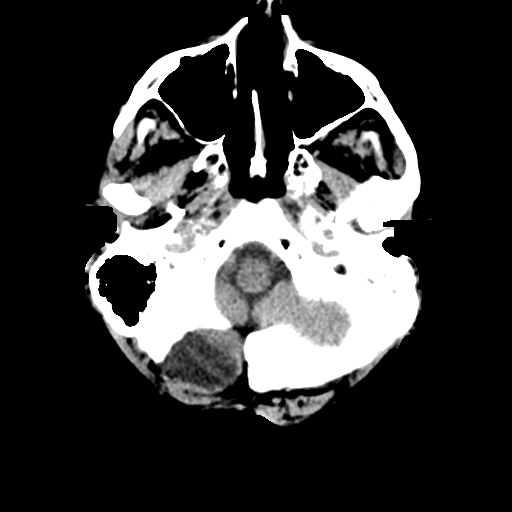
[im 3/31  bone]
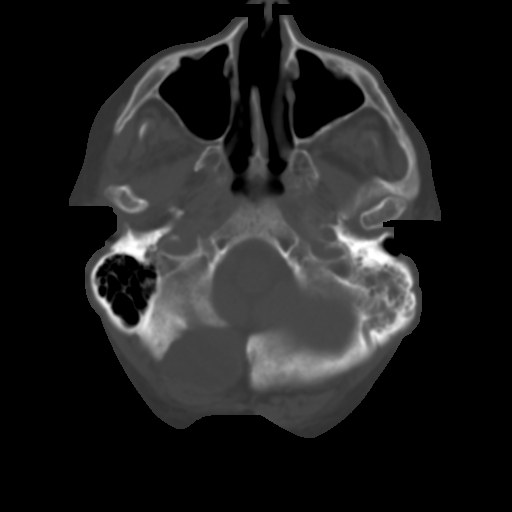
[im 6/31  brain]
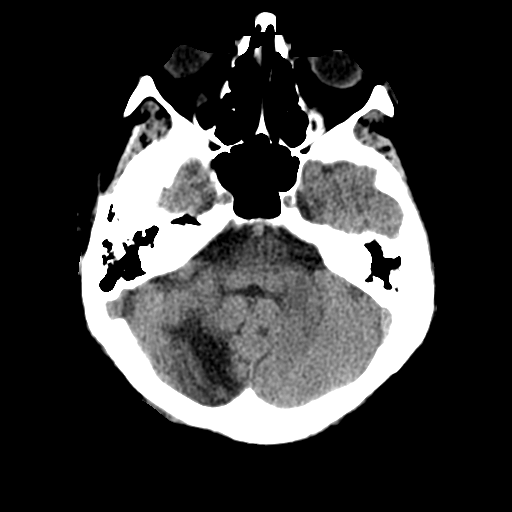
[im 9/31  brain]
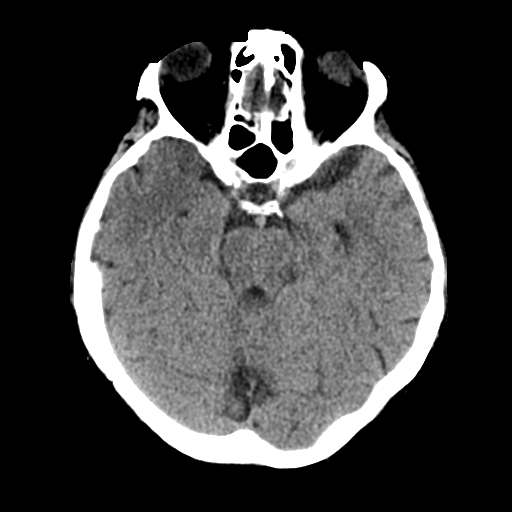
[im 12/31  brain]
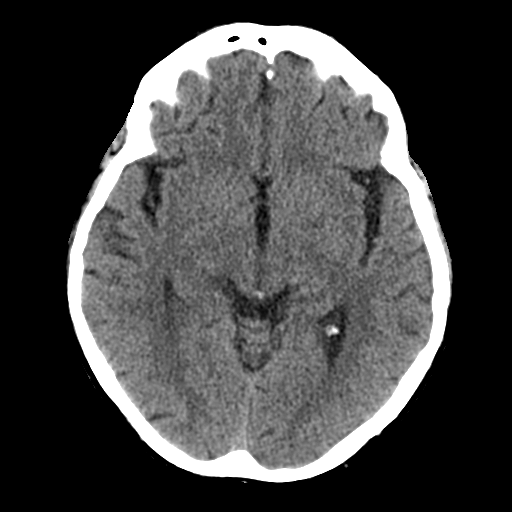
[im 16/31  brain]
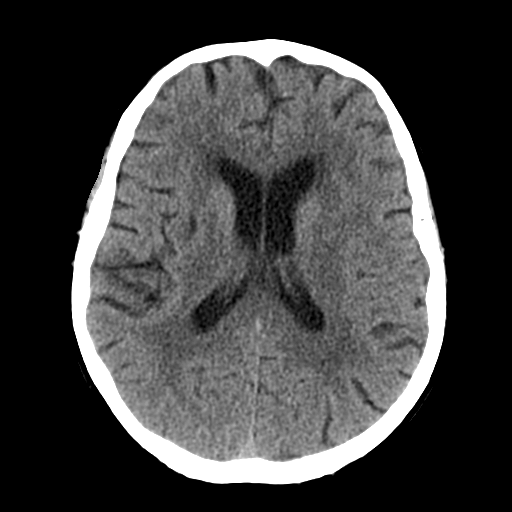
[im 16/31  bone]
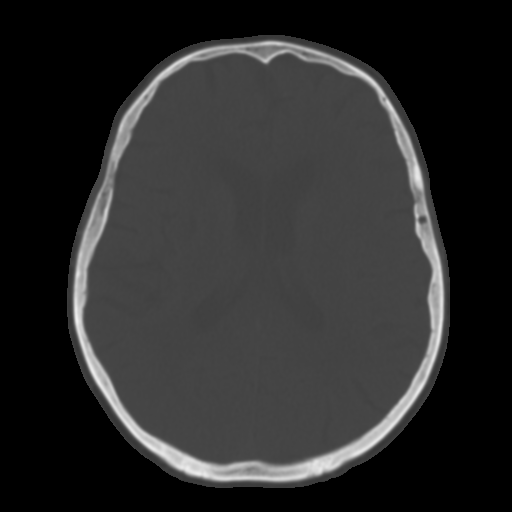
[im 19/31  brain]
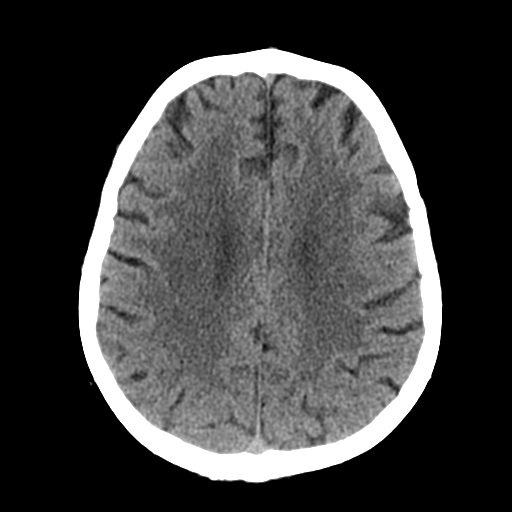
[im 22/31  brain]
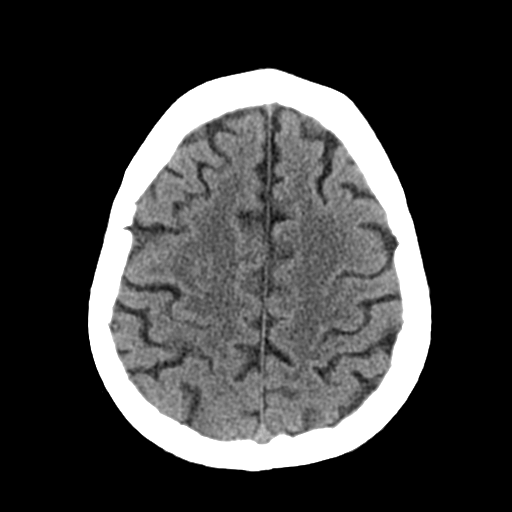
[im 25/31  brain]
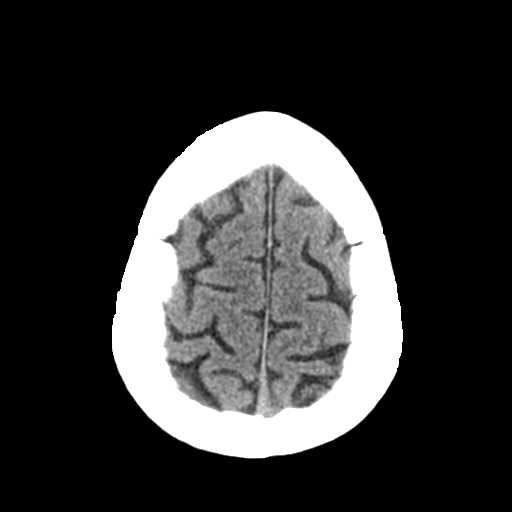
[im 28/31  brain]
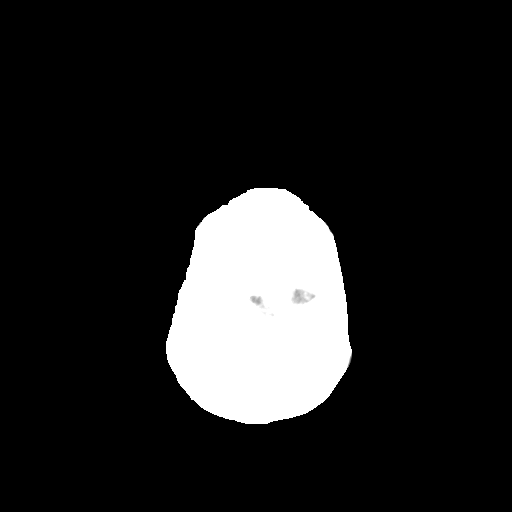
[im 28/31  bone]
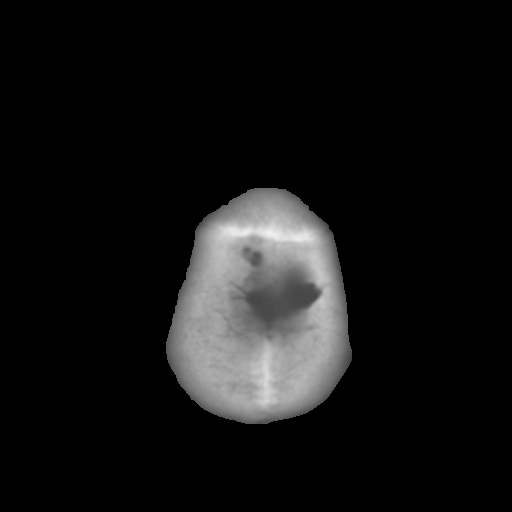

[Series 4: coronal soft tissue · coronal · 0.29mm/px · 3 of 63 slices shown]
[im 21/63  brain]
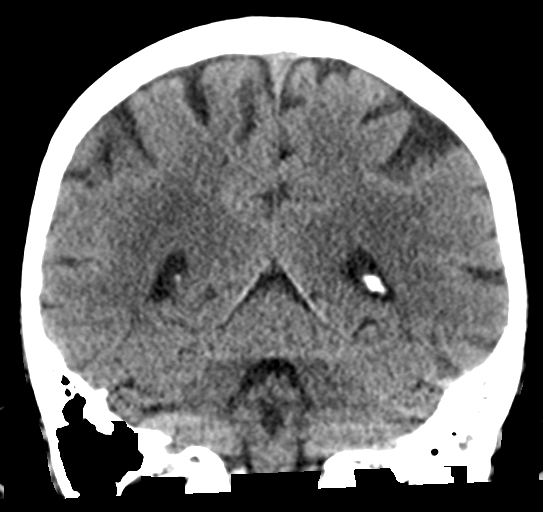
[im 28/63  brain]
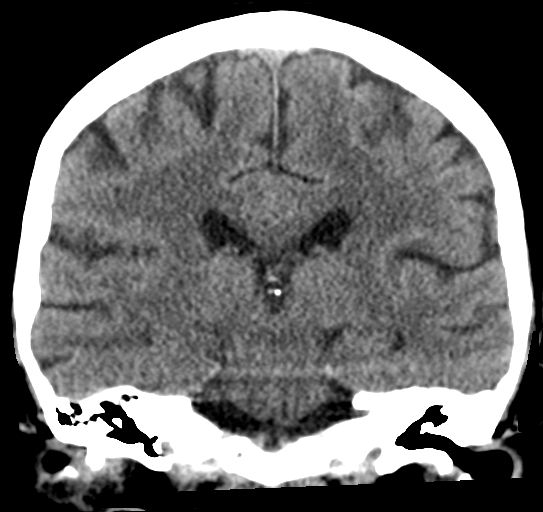
[im 35/63  brain]
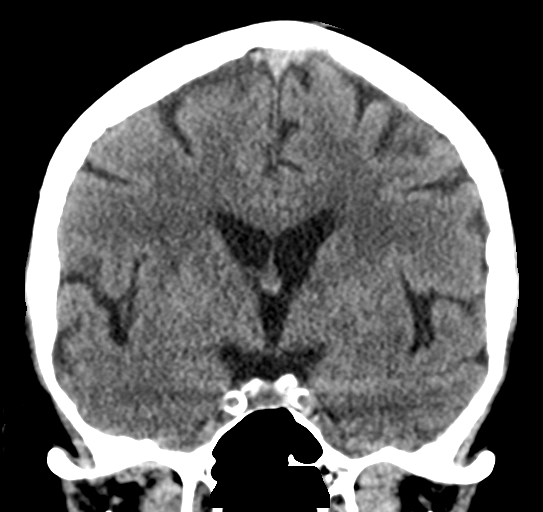

[Series 5: sagittal soft tissue · sagittal · 0.29mm/px · 3 of 53 slices shown]
[im 18/53  brain]
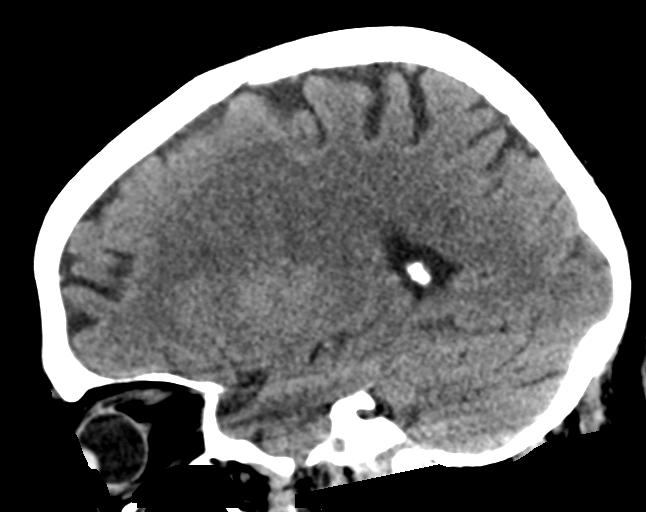
[im 27/53  brain]
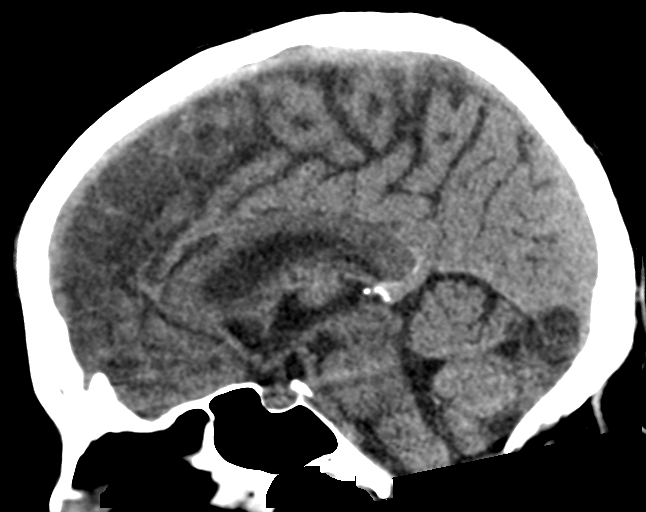
[im 35/53  brain]
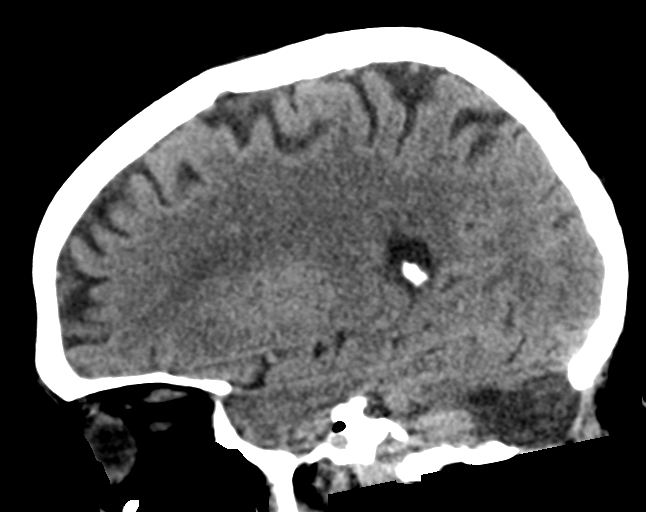

[15 of 47 positions shown; findings below may reference images not displayed]

FINDINGS: Brain: Right cerebellar encephalomalacia is redemonstrated with
suboccipital craniotomy changes. Stable chronic small vessel
ischemic disease and volume loss of the brain. No large vascular
territory infarct, hemorrhage or midline shift. No hydrocephalus or
extra-axial fluid collections. No effacement of the basal cisterns.

Vascular: No hyperdense vessel sign. Atherosclerosis of the carotid
siphons.

Skull: Suboccipital craniotomy changes as before. No acute osseous
abnormality.

Sinuses/Orbits: Clear paranasal sinuses. Intact orbits and globes.
Chronic small left mastoid effusion.

Other: None
IMPRESSION: 1. No acute intracranial abnormality. Chronic small vessel ischemic
disease as before.
2. Chronic right cerebellar encephalomalacia with adjacent
suboccipital craniotomy changes.
3. Chronic small left mastoid effusion.

## 2019-12-21 IMAGING — CT CT MAXILLOFACIAL W/O CM
4 of 6 series · 16 of 47 positions shown, 18 images · non-contrast
Comparison: Brain CT 03/13/2018

CLINICAL DATA: Patient status post fall.

EXAM:
CT HEAD WITHOUT CONTRAST
CT MAXILLOFACIAL WITHOUT CONTRAST
TECHNIQUE: Multidetector CT imaging of the head and maxillofacial structures
were performed using the standard protocol without intravenous
contrast. Multiplanar CT image reconstructions of the maxillofacial
structures were also generated.

[Series 7: head wo · axial · 0.39mm/px · z∈[+884,+984]mm · 6 of 30 slices shown, 8 images]
[im 5/30  brain]
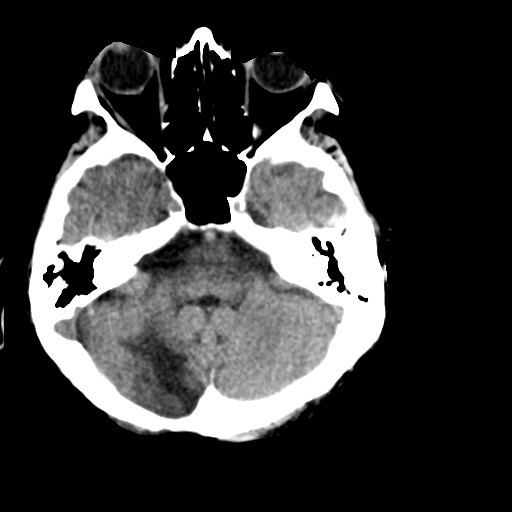
[im 5/30  bone]
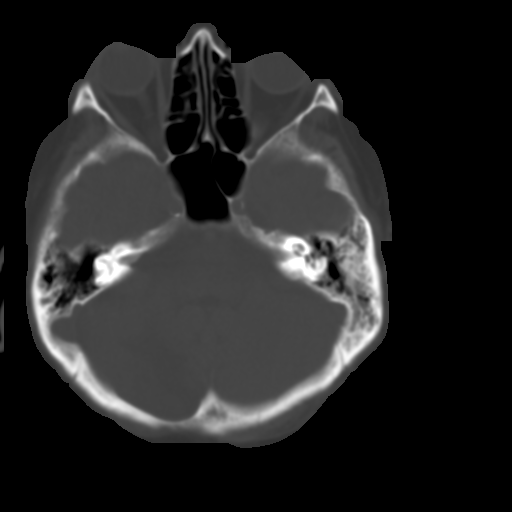
[im 9/30  bone]
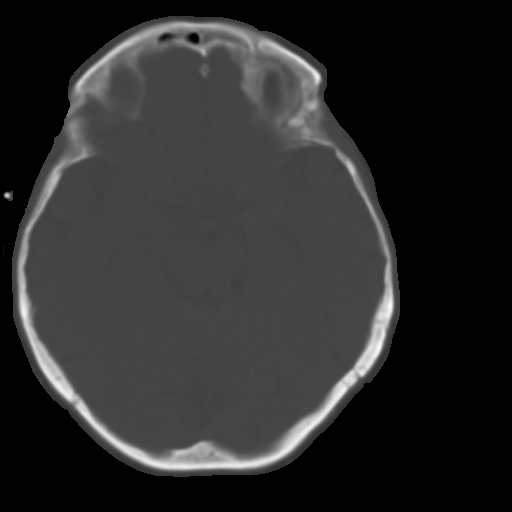
[im 13/30  bone]
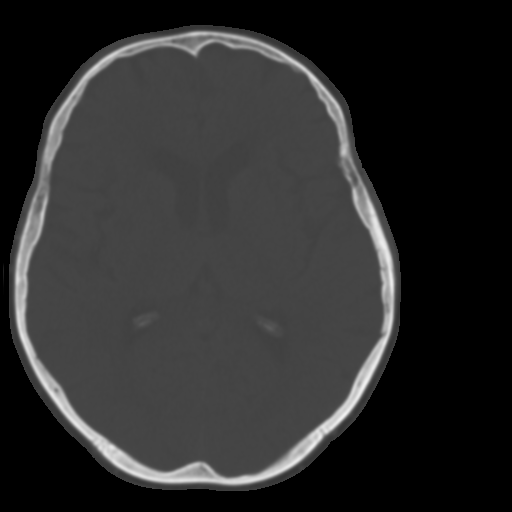
[im 17/30  bone]
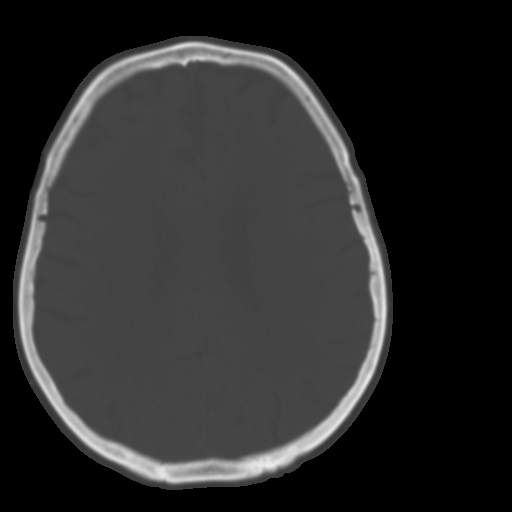
[im 21/30  brain]
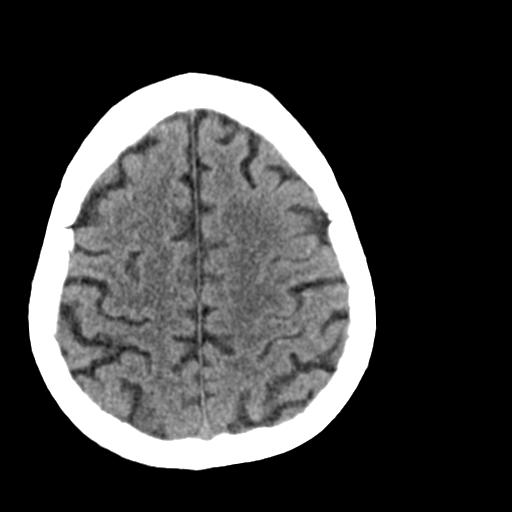
[im 21/30  bone]
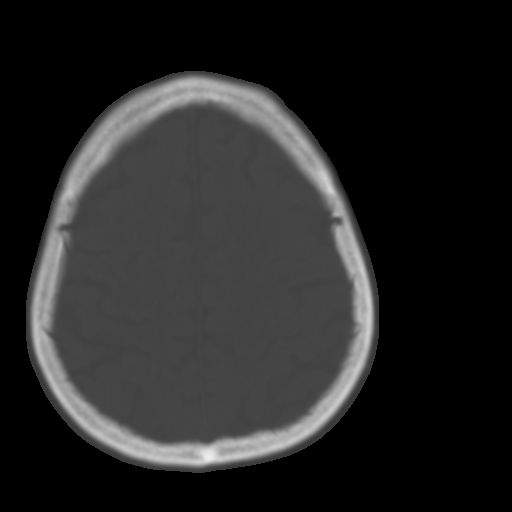
[im 25/30  bone]
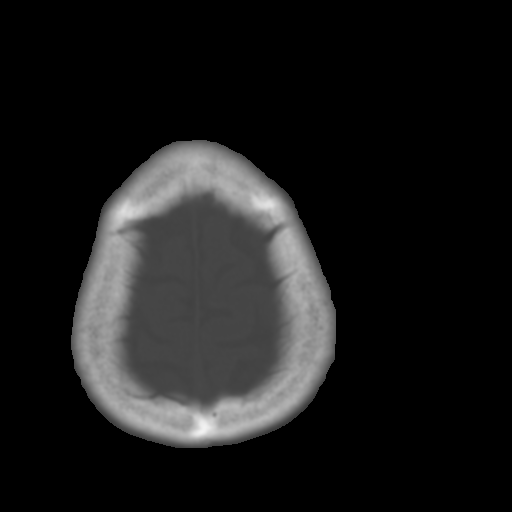

[Series 8: coronal soft tissue · coronal · 0.27mm/px · 3 of 63 slices shown]
[im 21/63  bone]
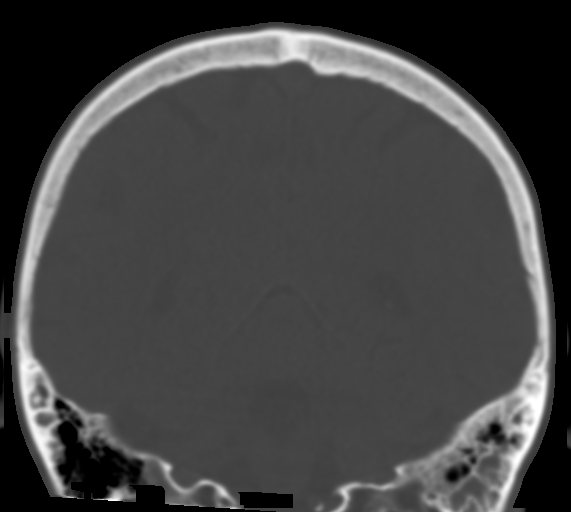
[im 31/63  bone]
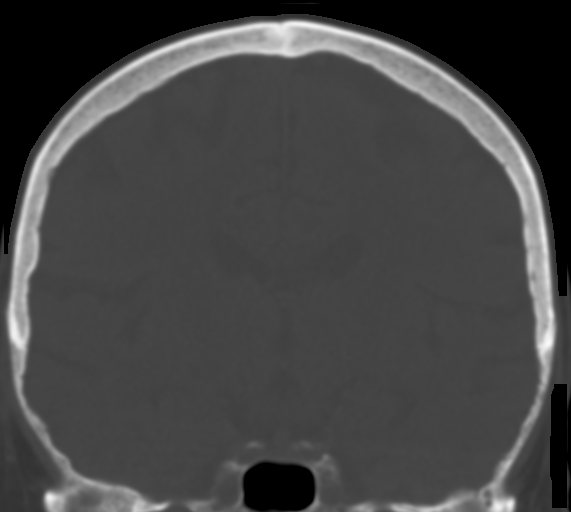
[im 40/63  bone]
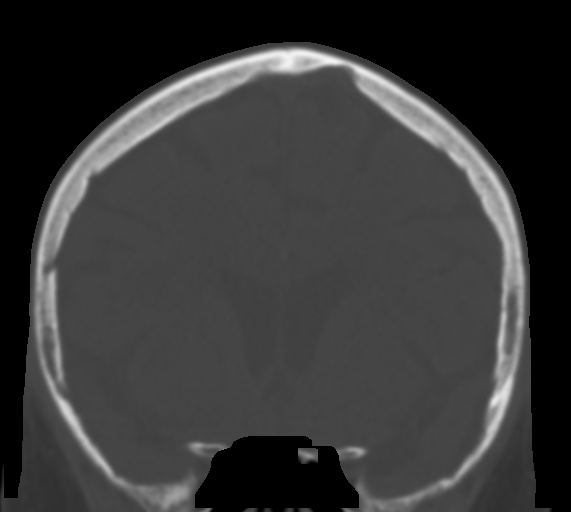

[Series 11: sagittal soft · sagittal · 0.26mm/px · 2 of 85 slices shown]
[im 29/85  bone]
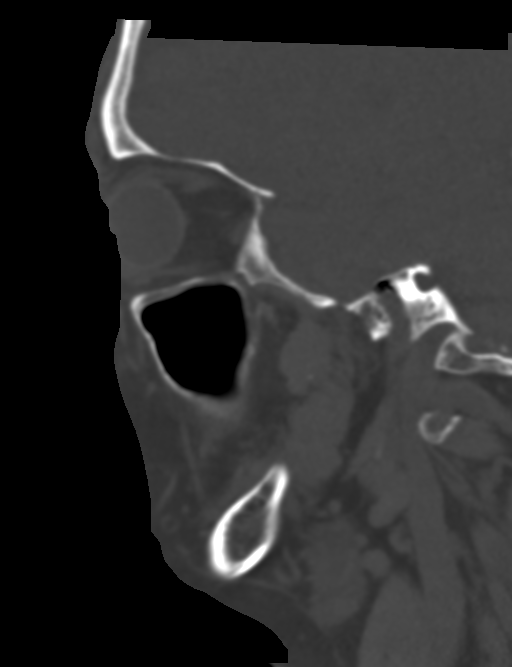
[im 57/85  bone]
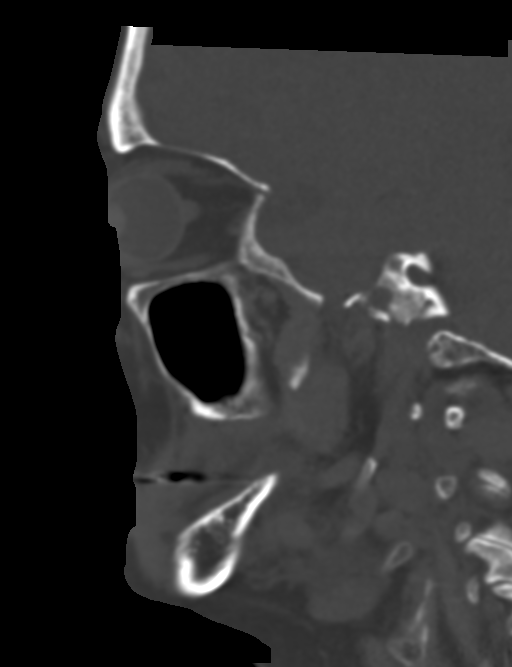

[Series 14: max soft- · axial · 0.33mm/px · z∈[+768,+852]mm · 5 of 90 slices shown]
[im 9/90  brain]
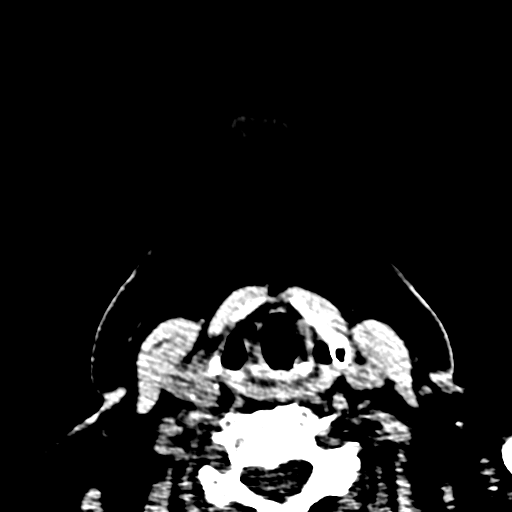
[im 17/90  brain]
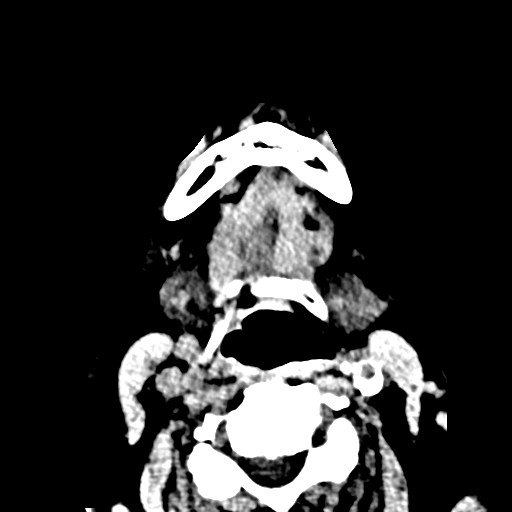
[im 30/90  brain]
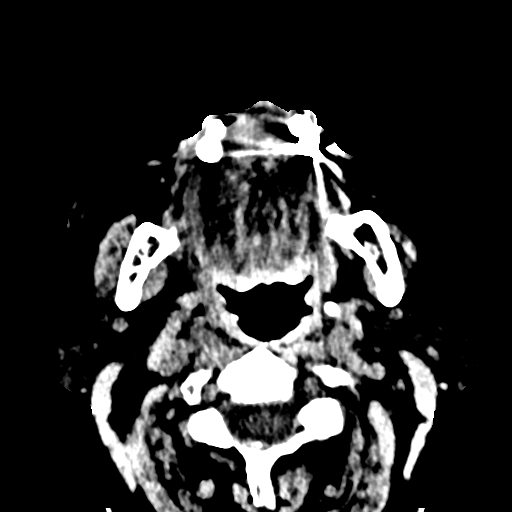
[im 39/90  brain]
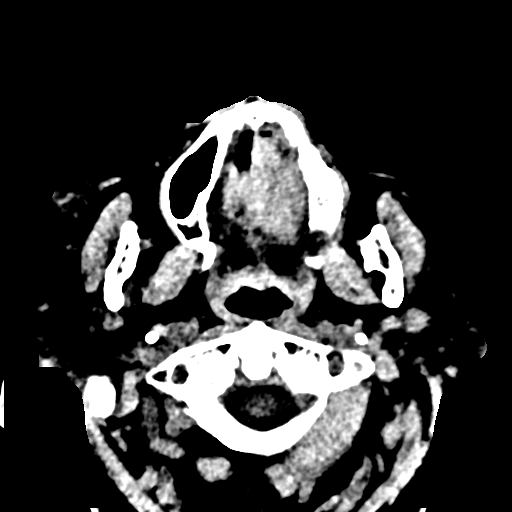
[im 51/90  brain]
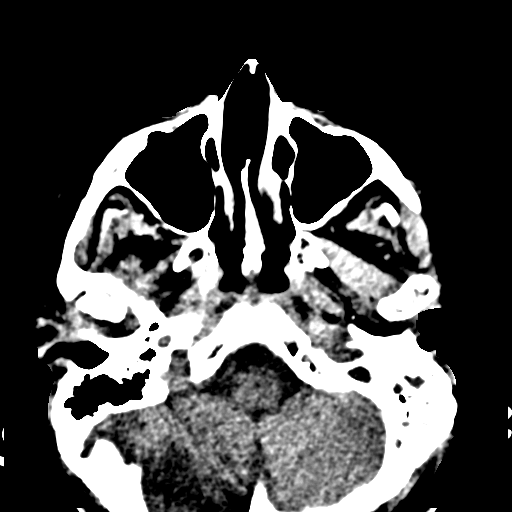

[16 of 47 positions shown; findings below may reference images not displayed]

FINDINGS: CT HEAD FINDINGS

Brain: Re- demonstrated right cerebellar encephalomalacia status
post suboccipital craniotomy. Ventricles and sulci are prominent
compatible with atrophy. Periventricular and subcortical white
matter hypodensity compatible with chronic microvascular ischemic
changes. No evidence for acute cortically based infarct,
intracranial hemorrhage, mass lesion or mass-effect.

Vascular: Unremarkable.

Skull: Suboccipital craniectomy. Fluid-filled left mastoid air
cells. Paranasal sinuses unremarkable.

Other: None.

CT MAXILLOFACIAL FINDINGS

Osseous: No fracture or mandibular dislocation. No destructive
process.

Orbits: Negative. No traumatic or inflammatory finding.

Sinuses: Negative

Soft tissues: Right periorbital soft tissue swelling. Soft tissue
hematoma/swelling overlying the right mandible.
IMPRESSION: No acute intracranial process.

No acute maxillofacial fracture.

Right periorbital soft tissue swelling. Soft tissue
swelling/hematoma overlying the right mandible.

## 2019-12-22 IMAGING — DX DG KNEE COMPLETE 4+V*R*
4 series · 4 of 4 positions shown · non-contrast
Comparison: 03/29/2014.

CLINICAL DATA: Pain after a fall yesterday.  Subsequent encounter.

EXAM:
RIGHT KNEE - COMPLETE 4+ VIEW

[knee ap]
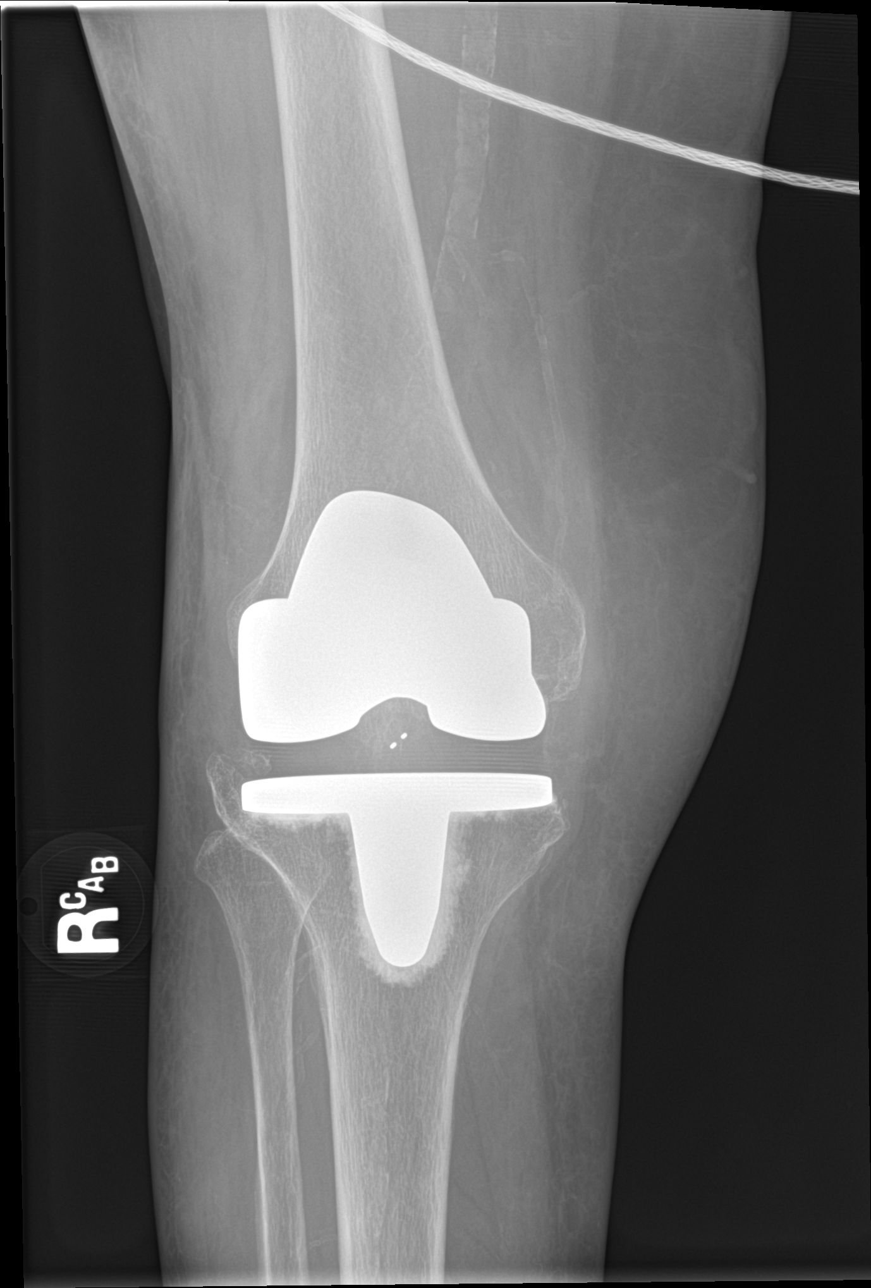

[knee lat]
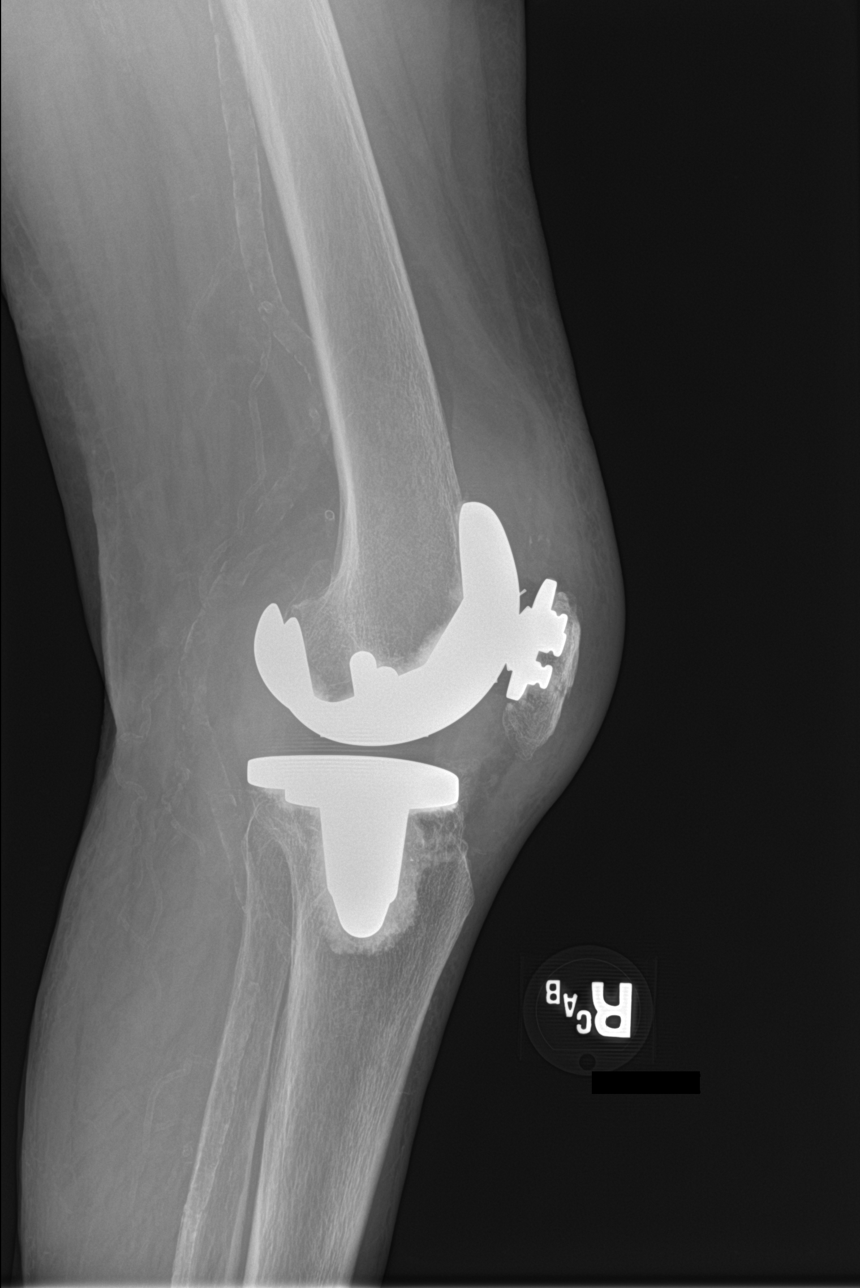

[knee obl (1 of 2)]
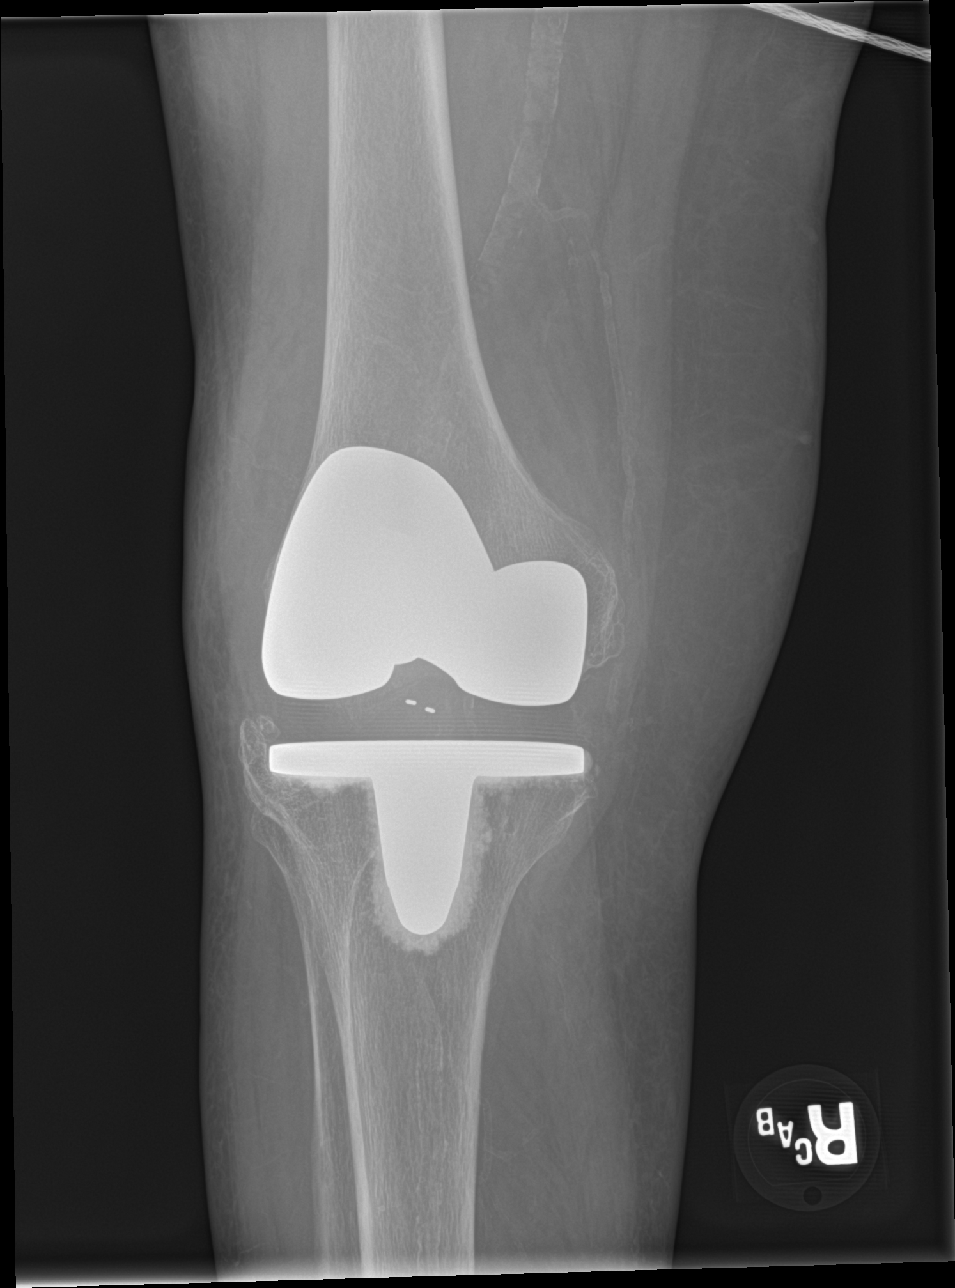

[knee obl (2 of 2)]
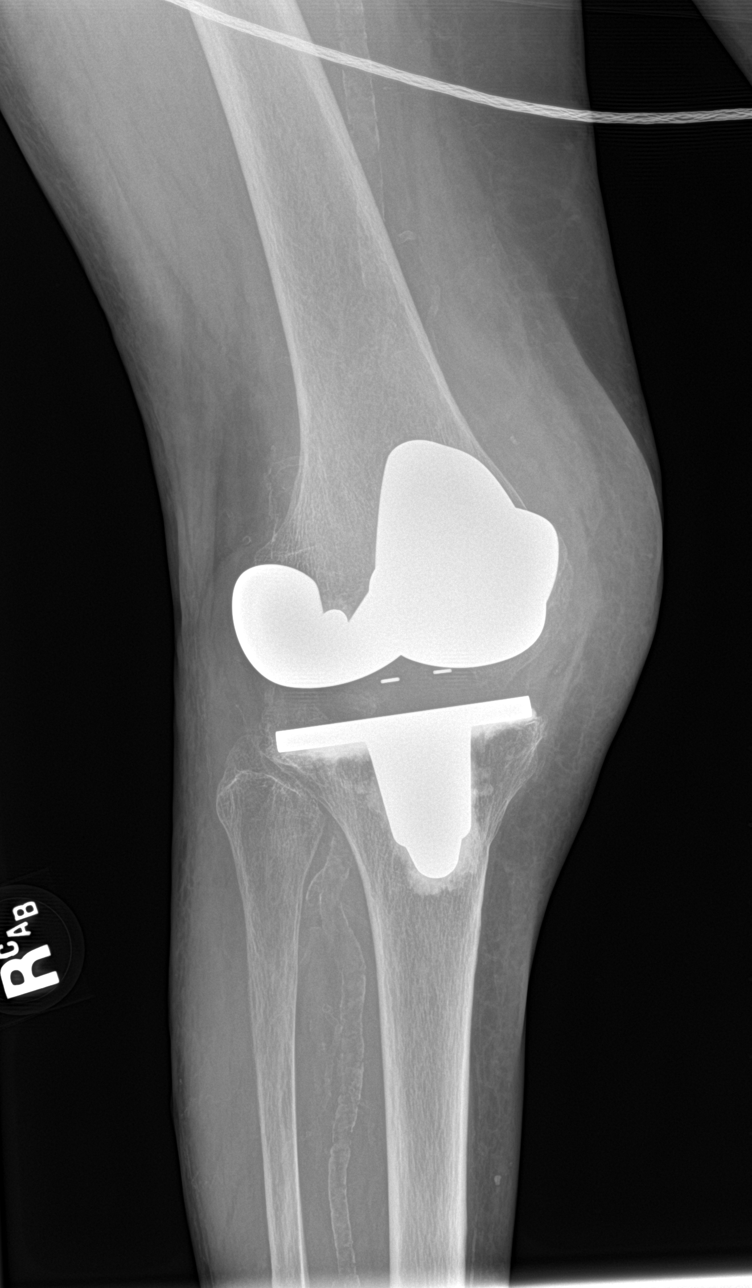

[4 of 4 positions shown; findings below may reference images not displayed]

FINDINGS: Prepatellar soft tissue swelling. Nondisplaced fracture involving
the midportion of the patella. No other fractures. Prior total knee
arthroplasty with anatomic alignment and no complicating features.
Osseous demineralization large joint effusion/hemarthrosis.

Femoropopliteal and tibioperoneal artery atherosclerosis.
IMPRESSION: 1. Nondisplaced fracture involving the midportion of the patella. No
other acute fractures.
2. RIGHT total knee arthroplasty with anatomic alignment and no
complicating features.
3. Large joint effusion/hemarthrosis.

## 2019-12-28 ENCOUNTER — Other Ambulatory Visit: Payer: Self-pay | Admitting: Orthopedic Surgery

## 2019-12-28 DIAGNOSIS — S22070A Wedge compression fracture of T9-T10 vertebra, initial encounter for closed fracture: Secondary | ICD-10-CM

## 2019-12-28 DIAGNOSIS — S3210XA Unspecified fracture of sacrum, initial encounter for closed fracture: Secondary | ICD-10-CM

## 2020-01-22 ENCOUNTER — Ambulatory Visit (INDEPENDENT_AMBULATORY_CARE_PROVIDER_SITE_OTHER): Payer: Medicare Other | Admitting: *Deleted

## 2020-01-22 DIAGNOSIS — I442 Atrioventricular block, complete: Secondary | ICD-10-CM | POA: Diagnosis not present

## 2020-01-22 LAB — CUP PACEART REMOTE DEVICE CHECK
Battery Remaining Longevity: 104 mo
Battery Remaining Percentage: 95.5 %
Battery Voltage: 2.99 V
Brady Statistic AP VP Percent: 1 %
Brady Statistic AP VS Percent: 1 %
Brady Statistic AS VP Percent: 99 %
Brady Statistic AS VS Percent: 1 %
Brady Statistic RA Percent Paced: 1 %
Brady Statistic RV Percent Paced: 99 %
Date Time Interrogation Session: 20210614020014
Implantable Lead Implant Date: 20200313
Implantable Lead Implant Date: 20200313
Implantable Lead Location: 753859
Implantable Lead Location: 753860
Implantable Lead Model: 5076
Implantable Lead Model: 5076
Implantable Pulse Generator Implant Date: 20200313
Lead Channel Impedance Value: 430 Ohm
Lead Channel Impedance Value: 540 Ohm
Lead Channel Pacing Threshold Amplitude: 0.75 V
Lead Channel Pacing Threshold Amplitude: 0.75 V
Lead Channel Pacing Threshold Pulse Width: 0.5 ms
Lead Channel Pacing Threshold Pulse Width: 0.5 ms
Lead Channel Sensing Intrinsic Amplitude: 12 mV
Lead Channel Sensing Intrinsic Amplitude: 2.6 mV
Lead Channel Setting Pacing Amplitude: 2 V
Lead Channel Setting Pacing Amplitude: 2.5 V
Lead Channel Setting Pacing Pulse Width: 0.5 ms
Lead Channel Setting Sensing Sensitivity: 4 mV
Pulse Gen Model: 2272
Pulse Gen Serial Number: 9118930

## 2020-01-23 NOTE — Progress Notes (Signed)
Remote pacemaker transmission.   

## 2020-01-29 ENCOUNTER — Ambulatory Visit (HOSPITAL_COMMUNITY)
Admission: RE | Admit: 2020-01-29 | Discharge: 2020-01-29 | Disposition: A | Discharge status: Home / Self Care | Payer: Medicare Other | Source: Ambulatory Visit | Attending: Orthopedic Surgery | Admitting: Orthopedic Surgery

## 2020-01-29 ENCOUNTER — Other Ambulatory Visit: Payer: Self-pay

## 2020-01-29 DIAGNOSIS — S3210XA Unspecified fracture of sacrum, initial encounter for closed fracture: Secondary | ICD-10-CM | POA: Diagnosis present

## 2020-01-29 DIAGNOSIS — S22070A Wedge compression fracture of T9-T10 vertebra, initial encounter for closed fracture: Secondary | ICD-10-CM | POA: Diagnosis not present

## 2020-02-09 ENCOUNTER — Non-Acute Institutional Stay: Payer: Medicare Other | Admitting: Nurse Practitioner

## 2020-02-09 VITALS — BP 120/74 | Wt 168.0 lb

## 2020-02-09 DIAGNOSIS — Z515 Encounter for palliative care: Secondary | ICD-10-CM

## 2020-02-09 DIAGNOSIS — I509 Heart failure, unspecified: Secondary | ICD-10-CM
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
## 2020-02-12 ENCOUNTER — Other Ambulatory Visit: Payer: Self-pay

## 2020-03-20 DIAGNOSIS — G8929 Other chronic pain: Secondary | ICD-10-CM | POA: Insufficient documentation

## 2020-03-20 DIAGNOSIS — M48062 Spinal stenosis, lumbar region with neurogenic claudication: Secondary | ICD-10-CM | POA: Insufficient documentation

## 2020-03-20 DIAGNOSIS — M545 Low back pain, unspecified: Secondary | ICD-10-CM | POA: Insufficient documentation

## 2020-04-22 ENCOUNTER — Ambulatory Visit (INDEPENDENT_AMBULATORY_CARE_PROVIDER_SITE_OTHER): Payer: Medicare Other | Admitting: *Deleted

## 2020-04-22 DIAGNOSIS — I442 Atrioventricular block, complete: Secondary | ICD-10-CM | POA: Diagnosis not present

## 2020-04-23 LAB — CUP PACEART REMOTE DEVICE CHECK
Battery Remaining Longevity: 111 mo
Battery Remaining Percentage: 95.5 %
Battery Voltage: 2.99 V
Brady Statistic AP VP Percent: 1 %
Brady Statistic AP VS Percent: 0 %
Brady Statistic AS VP Percent: 99 %
Brady Statistic AS VS Percent: 1 %
Brady Statistic RA Percent Paced: 1 %
Brady Statistic RV Percent Paced: 99 %
Date Time Interrogation Session: 20210913020013
Implantable Lead Implant Date: 20200313
Implantable Lead Implant Date: 20200313
Implantable Lead Location: 753859
Implantable Lead Location: 753860
Implantable Lead Model: 5076
Implantable Lead Model: 5076
Implantable Pulse Generator Implant Date: 20200313
Lead Channel Impedance Value: 410 Ohm
Lead Channel Impedance Value: 580 Ohm
Lead Channel Pacing Threshold Amplitude: 0.5 V
Lead Channel Pacing Threshold Amplitude: 0.75 V
Lead Channel Pacing Threshold Pulse Width: 0.5 ms
Lead Channel Pacing Threshold Pulse Width: 0.5 ms
Lead Channel Sensing Intrinsic Amplitude: 12 mV
Lead Channel Sensing Intrinsic Amplitude: 2.4 mV
Lead Channel Setting Pacing Amplitude: 2 V
Lead Channel Setting Pacing Amplitude: 2.5 V
Lead Channel Setting Pacing Pulse Width: 0.5 ms
Lead Channel Setting Sensing Sensitivity: 4 mV
Pulse Gen Model: 2272
Pulse Gen Serial Number: 9118930

## 2020-04-24 NOTE — Progress Notes (Signed)
Remote pacemaker transmission.   

## 2020-05-03 ENCOUNTER — Other Ambulatory Visit: Payer: Self-pay

## 2020-05-03 ENCOUNTER — Non-Acute Institutional Stay: Payer: Medicare Other | Admitting: Nurse Practitioner

## 2020-05-03 ENCOUNTER — Encounter: Payer: Self-pay | Admitting: Nurse Practitioner

## 2020-05-03 VITALS — Wt 165.6 lb

## 2020-05-03 DIAGNOSIS — I509 Heart failure, unspecified: Secondary | ICD-10-CM

## 2020-05-03 DIAGNOSIS — Z515 Encounter for palliative care: Secondary | ICD-10-CM

## 2020-05-03 NOTE — Progress Notes (Signed)
[facility] Consult Note Telephone: [phone]  Fax: [phone]  [name] DOB: 05/29/1946 MRN: [id number]  PRIMARY CARE PROVIDER:Dr Yves Dill PROVIDER:Dr Hodges/[place] PARTY:Self  1.BCW:[id number] goals consists DNR, do not intubate. Wishes are for more conservative care to include antibiotics, IV fluids, blood transfusions, diagnostic testing, lab testing. Wishes are to minimize hospitalizations if necessary.  2.Dyspneic secondary to congestive heart failure remain stable at present time. Continue daily weights and outpatient Cardiology appointments  3.Palliative care encounter; Palliative medicine team will continue to support patient, patient's family, and medical team. Visit consisted of counseling and education dealing with the complex and emotionally intense issues of symptom management and palliative   5. F/u 2 months for ongoing monitoring disease progression, chronic nausea, debility.   I spent 60 minutes providing this consultation, start from 11:30am. More than 50% of the time in this consultation was spent coordinating communication.   HISTORY OF PRESENT ILLNESS:  Meghan [name] is a 74 y.o. year old female with multiple medical problems including COPD, congestive heart failure with ef 20%, asthma, collagen vascular disease, coronary artery disease, heart murmur, benign brain tumor, hypertension, gerd, hypercholesterolemia, history of headache, diabetes, gerd, arthritis, osteoporosis, lumbar degenerative disc disease, anxiety, PTSD, depression, kyphoplasty, right hand reconstruction 2011, cholecystectomy, cervical fusion, abdominal hysterectomy.Meghan [name] continues to reside at [facility]. Meghan [name] does require some assistance with mobility though she does walk with a walker. Meghan [name] requires assistance  with ADLs including bathing and dressing though she can do some on her own. Meghan [name] feeds herself. Appetite has been fair depending on what is being served. No recent calls, wounds, infections, hospitalizations. Medical goals focus on DNR, do not intubate, do not hospitalized, no surgical intervention, no feeding tube but wishes are for antibiotics, blood transfusion, diagnostic testing, lab testing, IV hydration. Last primary provider note by [facility] Nurse Practitioner 8 / 23 / 2021 for comprehensive review with COPD stable. Congestive heart failure no reason exacerbations she does have a pacemaker. Meghan [name] is followed by Orthopedics physical therapy to improve mobility and partial weight-bearing ambulates around with a walker or wheelchair. A1C at goal. Meghan [name] is also followed by Psychotherapy at the facility in addition to Psychiatry. Last psychiatric visit 9/9 / 2021 for bipolar disorder currently on Lexapro and Remeron for mood and appetite in addition to anxiety for what she is giving Buspar with PTSD Klonopin. No changes at this visit. Staff endorses Meghan [name] does continue to have chronic nausea intermittently with diarrhea. No other changes are concerns. I visited and observed Meghan [name]. We talked about purpose of palliative care visit. Meghan [name] in agreement. We talked about how she was feeling. Meghan [name] endorse is today she is chronically nauseous had a hard time eating her breakfast and has diarrhea again. We talked about pattern of her GI symptoms for what she's had for over a year. We talked about the option of possibly seeing a gastroenterologist. We talked about endoscopy and colonoscopy. Meghan [name] endorses she is open to seeing a gastroenterologist in will further discuss with [facility] Nurse Practitioner. We talked about symptoms of pain what she currently denies. Medical goals review. We talked about challenges resigning and skilled facility, barriers and coping strategies. We talked about  quality-of-life. We talked about role of palliative care and plan of care. We talked about overall at present time she is stable  most of PC visit supportive. No other changes recommended at this time. Will follow up in 2 months or sooner should she did fine. I updated nursing staff, GI consult to be ordered.  Palliative Care was asked to help to continue to address goals of care.   CODE STATUS: DNR  PPS: 50% HOSPICE ELIGIBILITY/DIAGNOSIS: TBD  PAST MEDICAL HISTORY:  Past Medical History:  Diagnosis Date  . Anginal pain ([place])   . Anxiety   . Arthritis    RA  . Asthma   . Brain tumor (benign) ([place])   . CHF (congestive heart failure) ([place])   . Collagen vascular disease ([place])   . Complete heart block ([place])   . COPD (chronic obstructive pulmonary disease) ([place])   . Coronary artery disease   . DDD (degenerative disc disease)   . Depression   . Diabetes mellitus   . GERD (gastroesophageal reflux disease)   . Headache   . Heart murmur   . Hypercholesteremia   . Hypertension   . Lumbar degenerative disc disease   . Migraines   . Obesity   . Osteopenia   . Osteoporosis   . Pacemaker- St Jude   . Pneumonia   . PTSD (post-traumatic stress disorder)     SOCIAL HX:  Social History   Tobacco Use  . Smoking status: Never Smoker  . Smokeless tobacco: Never Used  Substance Use Topics  . Alcohol use: No    Alcohol/week: 0.0 standard drinks    ALLERGIES:  Allergies  Allergen Reactions  . Amoxicillin Itching and Other (See Comments)    Has patient had a PCN reaction causing immediate rash, facial/tongue/throat swelling, SOB or lightheadedness with hypotension: Unknown Has patient had a PCN reaction causing severe rash involving mucus membranes or skin necrosis: Unknown Has patient had a PCN reaction that required hospitalization: Unknown Has patient had a PCN reaction occurring within the last 10 years: Unknown If all of the above answers are "NO", then may proceed with  Cephalosporin use.   . Gabapentin Other (See Comments)    Pt states that it causes her BP to drop.   . Naproxen Hives  . Nsaids Other (See Comments)    Reaction:  Unknown   . Tolmetin Other (See Comments)    Reaction:  Unknown      PERTINENT MEDICATIONS:  Outpatient Encounter Medications as of 05/03/2020  Medication Sig  . albuterol (VENTOLIN HFA) 108 (90 Base) MCG/ACT inhaler Inhale 1-2 puffs into the lungs every 6 (six) hours as needed for wheezing or shortness of breath.  . busPIRone (BUSPAR) 7.5 MG tablet Take 1 tablet by mouth 2 (two) times daily.  . Calcium Carb-Cholecalciferol (CALCIUM 500/D) 500-400 MG-UNIT CHEW Chew by mouth daily.  . Cholecalciferol 1.25 MG (50000 UT) TABS Take by mouth once a week.  . clonazePAM (KLONOPIN) 0.5 MG tablet Take 0.125 mg by mouth 2 (two) times daily.  . cyanocobalamin 500 MCG tablet Take 500 mcg by mouth daily.  . diclofenac sodium (VOLTAREN) 1 % GEL Apply 2 g topically 3 (three) times daily. Apply to the fingers  . escitalopram (LEXAPRO) 10 MG tablet Take 1 tablet (10 mg total) by mouth every morning. (Patient taking differently: Take 20 mg by mouth every morning. )  . fluticasone (VERAMYST) 27.5 MCG/SPRAY nasal spray Place 2 sprays into the nose 2 (two) times daily.  . folic acid (FOLVITE) 1 MG tablet Take 1 mg by mouth daily.  . furosemide (LASIX) 40 MG tablet Take 40 mg  by mouth 2 (two) times daily.  . Ipratropium-Albuterol (COMBIVENT RESPIMAT) 20-100 MCG/ACT AERS respimat Inhale 1 puff into the lungs every 6 (six) hours.  Marland Kitchen lamoTRIgine (LAMICTAL) 100 MG tablet Take 1 tablet (100 mg total) by mouth daily.  Marland Kitchen loperamide (IMODIUM A-D) 2 MG tablet Take 1 tablet (2 mg total) by mouth 4 (four) times daily as needed for diarrhea or loose stools. (Patient taking differently: Take 2 mg by mouth as needed for diarrhea or loose stools. )  . loratadine (CLARITIN) 10 MG tablet Take 10 mg by mouth daily.  . methotrexate (RHEUMATREX) 7.5 MG tablet Take 7.5 mg  by mouth once a week. Caution" Chemotherapy. Protect from light.  . mirtazapine (REMERON) 7.5 MG tablet Take 7.5 mg by mouth at bedtime.   Marland Kitchen omeprazole (PRILOSEC) 20 MG capsule Take 20 mg by mouth daily.   Marland Kitchen oxyCODONE-acetaminophen (PERCOCET/ROXICET) 5-325 MG tablet Take 1 tablet by mouth every 4 (four) hours as needed for moderate pain or severe pain.  Marland Kitchen oxyCODONE-acetaminophen (PERCOCET/ROXICET) 5-325 MG tablet Take 1 tablet by mouth 2 (two) times a day. scheduled  . Potassium Chloride ER 20 MEQ TBCR Take 20 mEq by mouth 2 (two) times daily.  . pramipexole (MIRAPEX) 0.25 MG tablet Take 0.25 mg by mouth daily.   . saxagliptin HCl (ONGLYZA) 5 MG TABS tablet Take 5 mg by mouth at bedtime.  . Tofacitinib Citrate 5 MG TABS Take 5 mg by mouth 2 (two) times daily after a meal.   . topiramate (TOPAMAX) 25 MG tablet Take 25 mg by mouth at bedtime.    No facility-administered encounter medications on file as of 05/03/2020.    PHYSICAL EXAM:   General: NAD, frail appearing, chronically pleasant female Cardiovascular: regular rate and rhythm Pulmonary: clear ant fields Neurological: walks with walker  Asra Gambrel Ihor Gully, NP

## 2020-07-03 IMAGING — DX PORTABLE CHEST - 1 VIEW
1 series · 1 of 1 positions shown · non-contrast
Comparison: October 20, 2018

CLINICAL DATA: Preoperative evaluation for femur fracture

EXAM:
PORTABLE CHEST 1 VIEW

[chest ap]
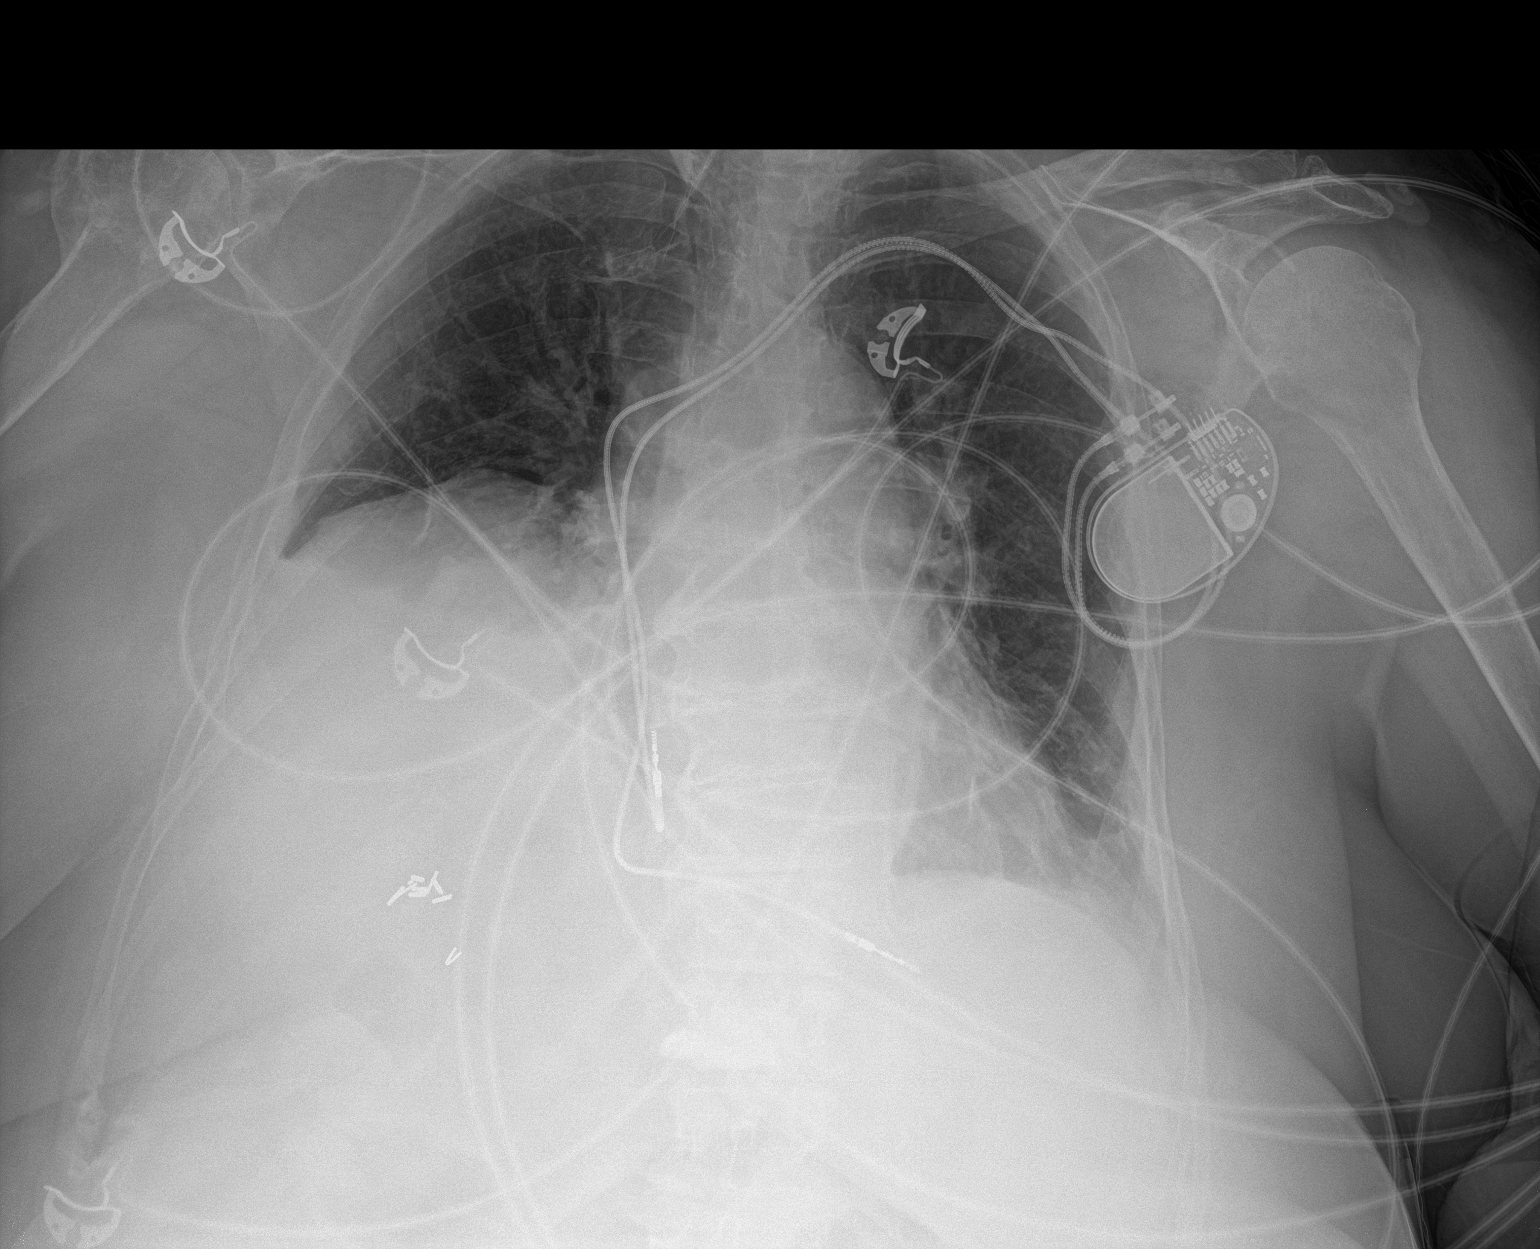

[1 of 1 positions shown; findings below may reference images not displayed]

FINDINGS: There is persistent elevation the right hemidiaphragm with
atelectatic change in the right base. There is no edema or
consolidation. Heart size and pulmonary vascularity within normal
limits. There is no a pacemaker present with lead tips attached to
the right atrium and right ventricle. Loop recorder no longer
appreciable. No adenopathy. There is age uncertain trauma involving
the proximal right humerus.
IMPRESSION: Persistent elevation right hemidiaphragm with atelectasis right
base. No frank airspace consolidation. Heart size within normal
limits. Pacemaker now present with lead tips attached to right
atrium and right ventricle. Age uncertain trauma proximal right
humerus.

## 2020-07-04 IMAGING — DX RIGHT FEMUR PORTABLE 2 VIEW
1 series · 4 of 4 positions shown · non-contrast
Comparison: None.

CLINICAL DATA: ORIF of distal femoral fracture.

EXAM:
RIGHT FEMUR PORTABLE 2 VIEW

[Series 1: femur · 0.14mm/px · 4 of 4 slices shown]
[im 1/4]
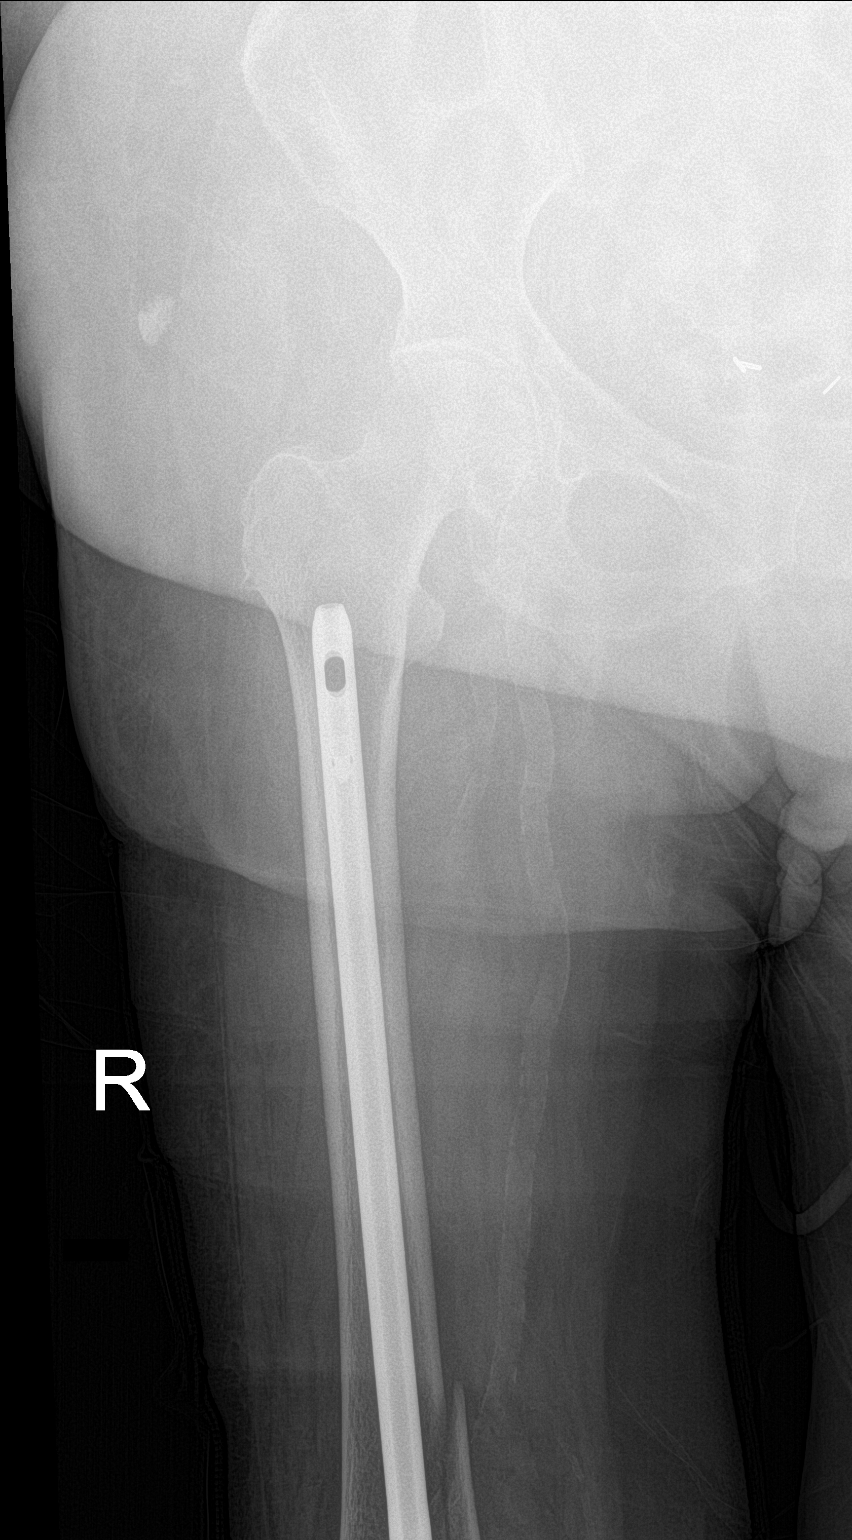
[im 2/4]
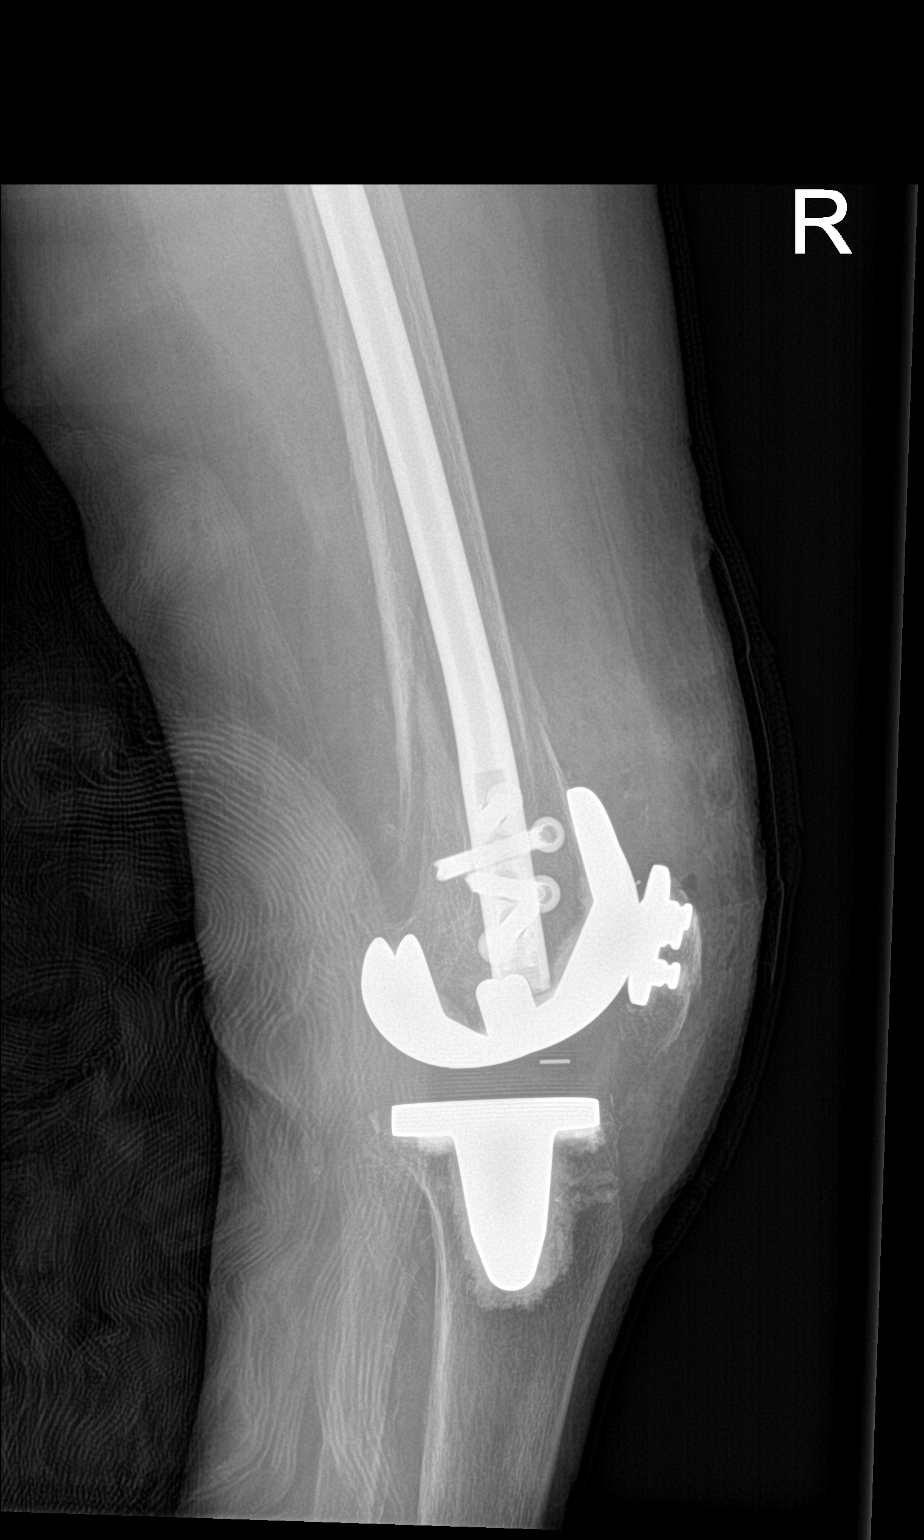
[im 3/4]
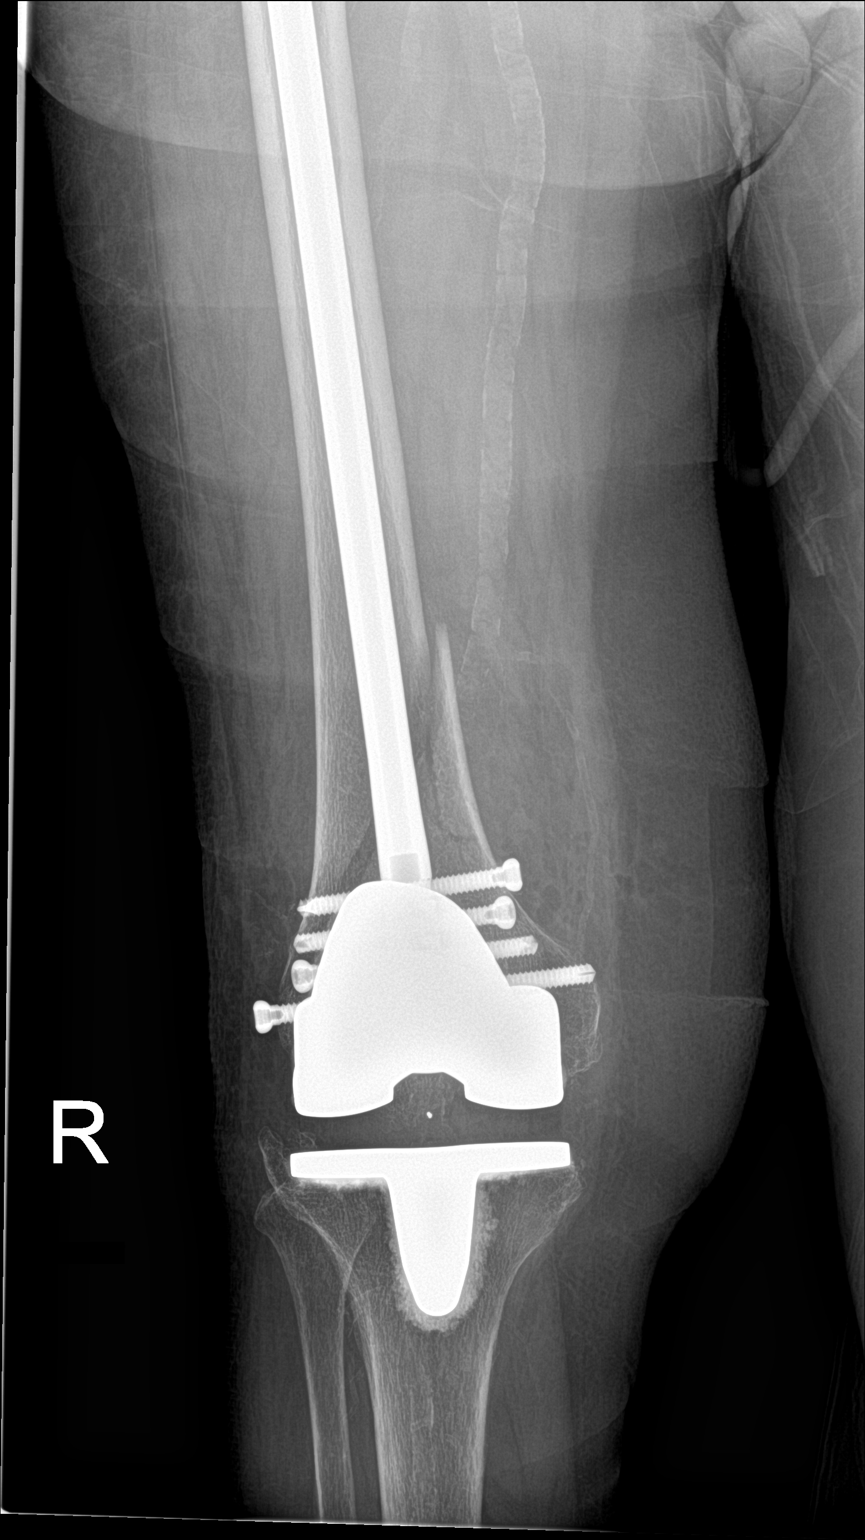
[im 4/4]
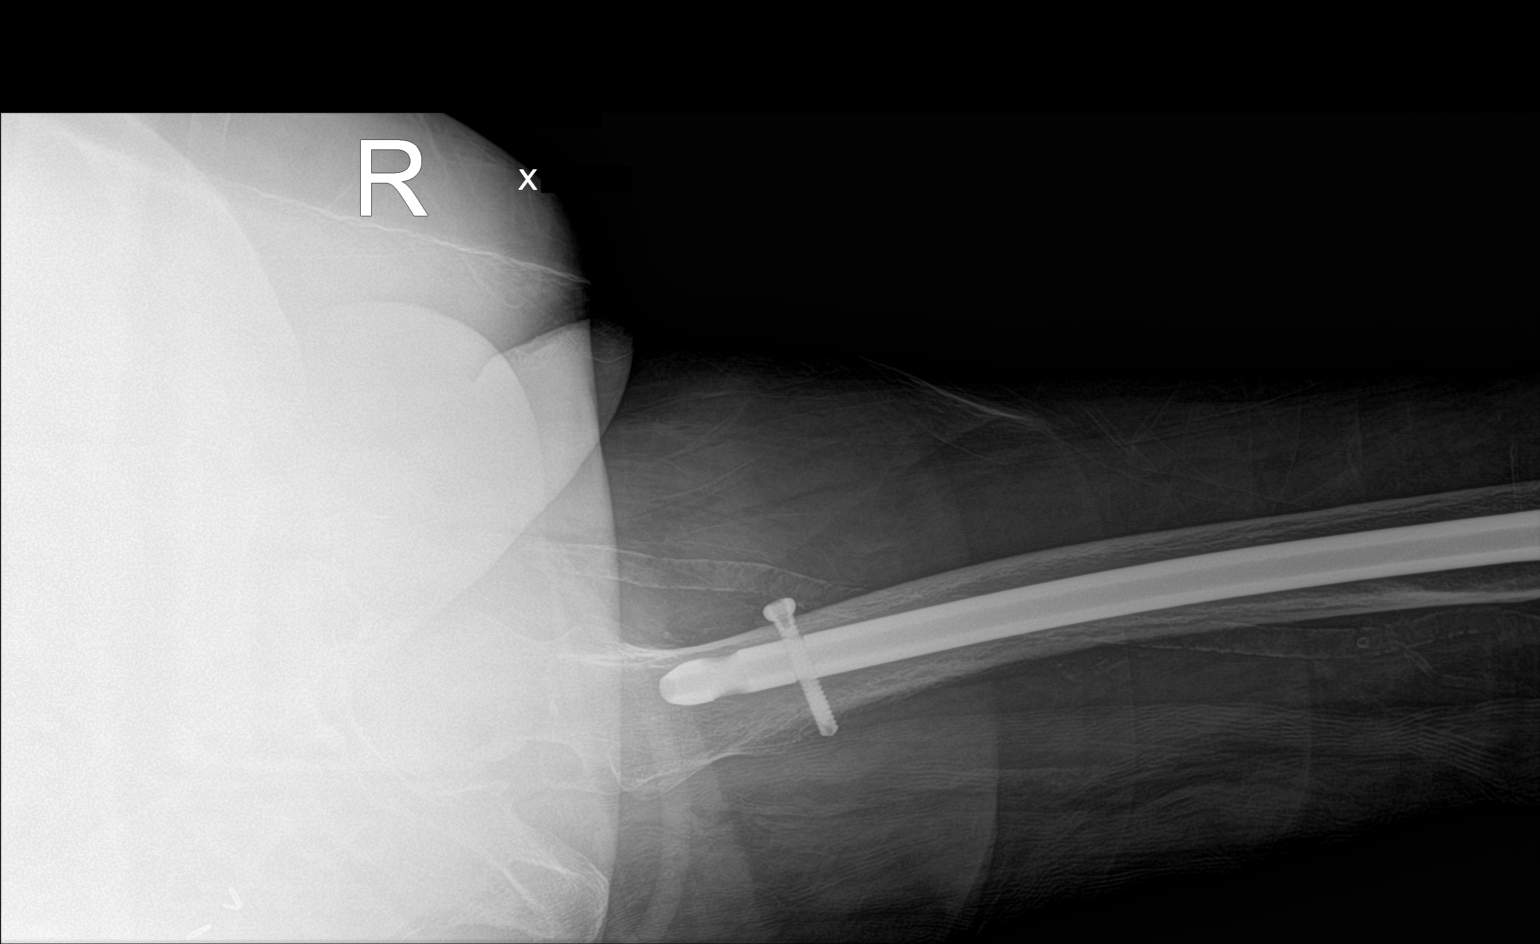

[4 of 4 positions shown; findings below may reference images not displayed]

FINDINGS: A rod has been placed through the right femur, crossing the distal
femoral fracture. Proximal and distal interlocking screws are
identified. The patient is also status post knee replacement.
Hardware is in good position.
IMPRESSION: ORIF of the right femur as above.

## 2020-07-04 IMAGING — RF RIGHT FEMUR PORTABLE 2 VIEW
1 series · 5 of 5 positions shown · non-contrast
Comparison: None.

CLINICAL DATA: Fracture repair.

FLUOROSCOPY TIME:  2 minutes and 34 seconds.
Images: 5
EXAM:
RIGHT FEMUR PORTABLE 2 VIEW

[Series 1: run · 5 of 5 slices shown]
[im 1/5]
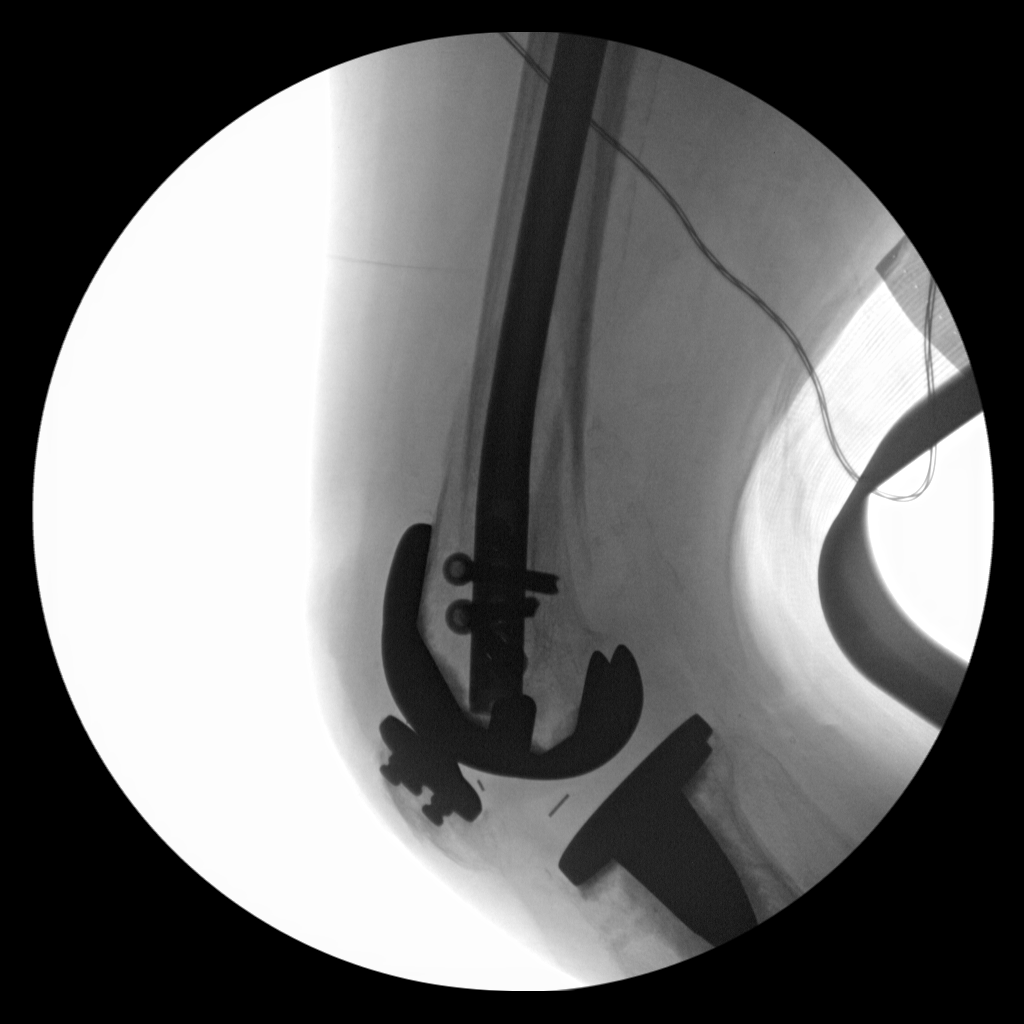
[im 2/5]
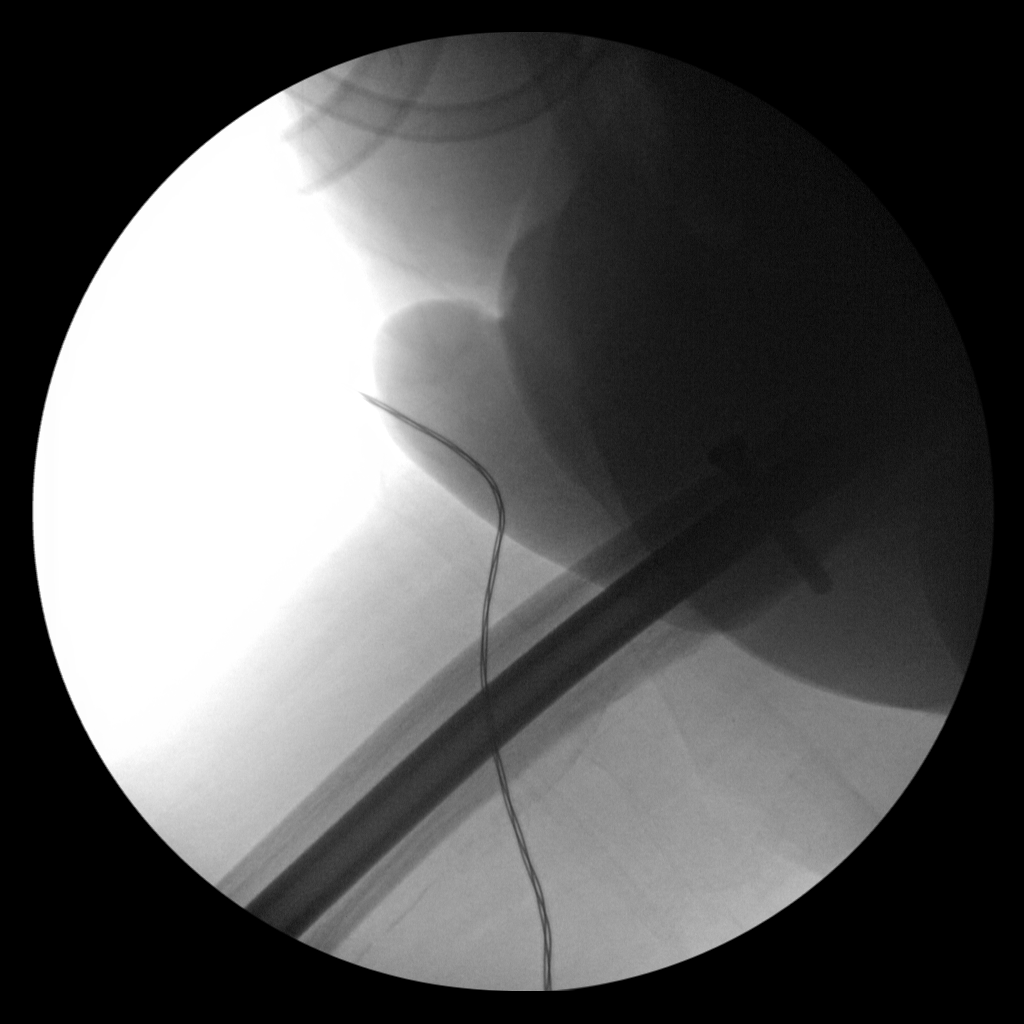
[im 3/5]
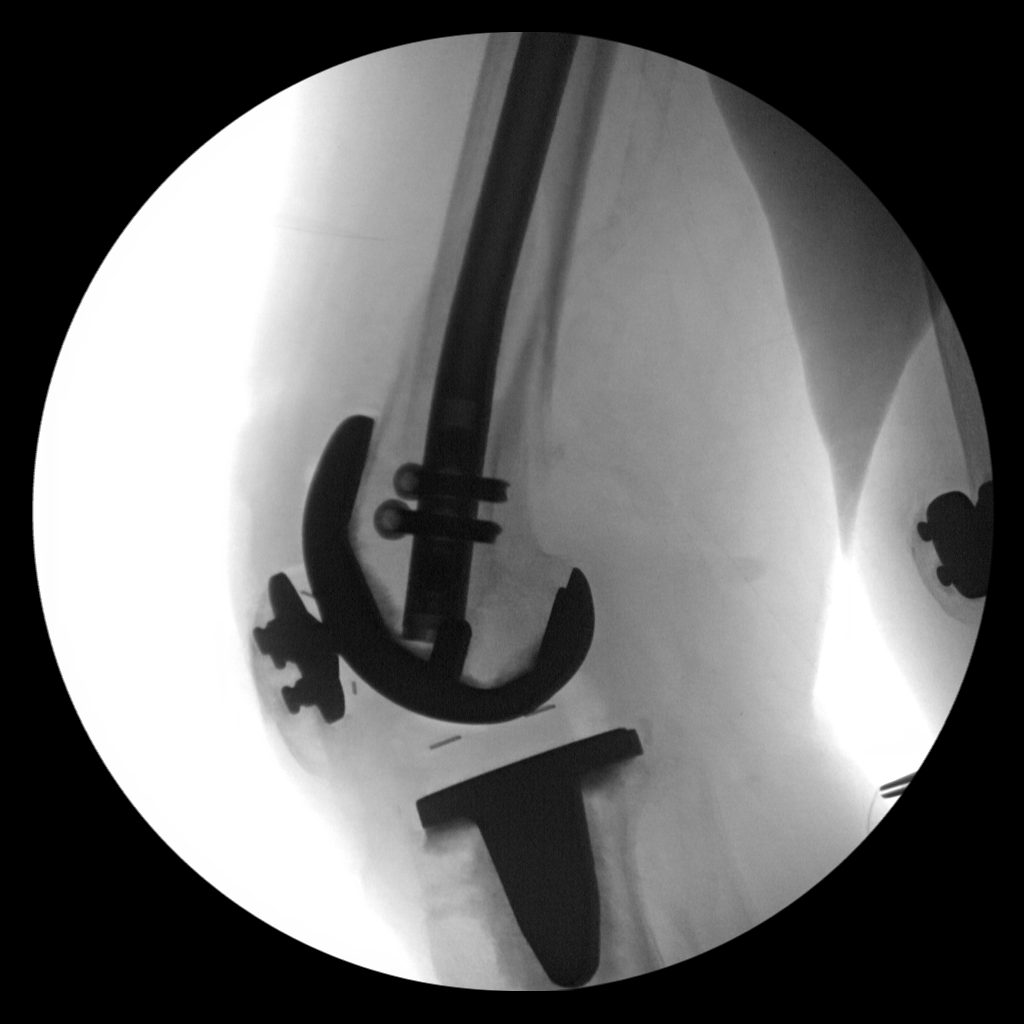
[im 4/5]
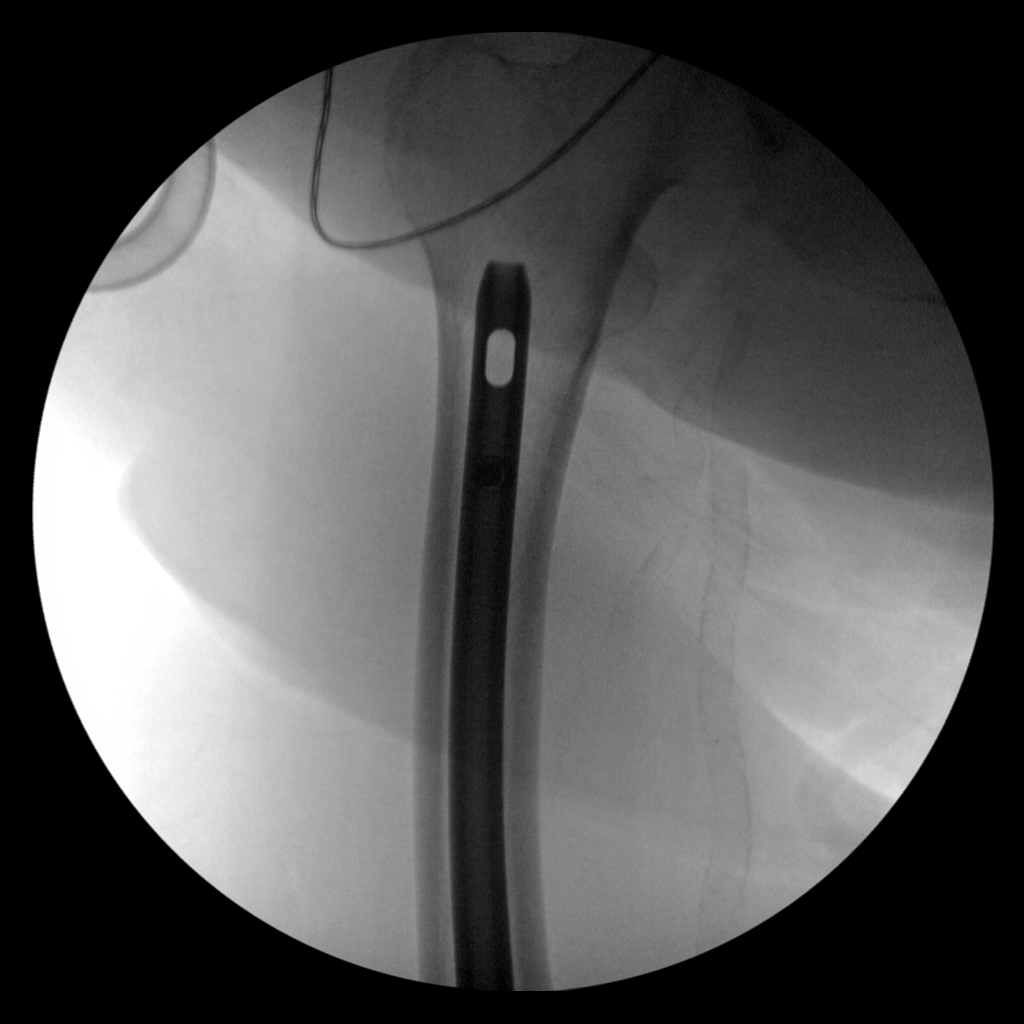
[im 5/5]
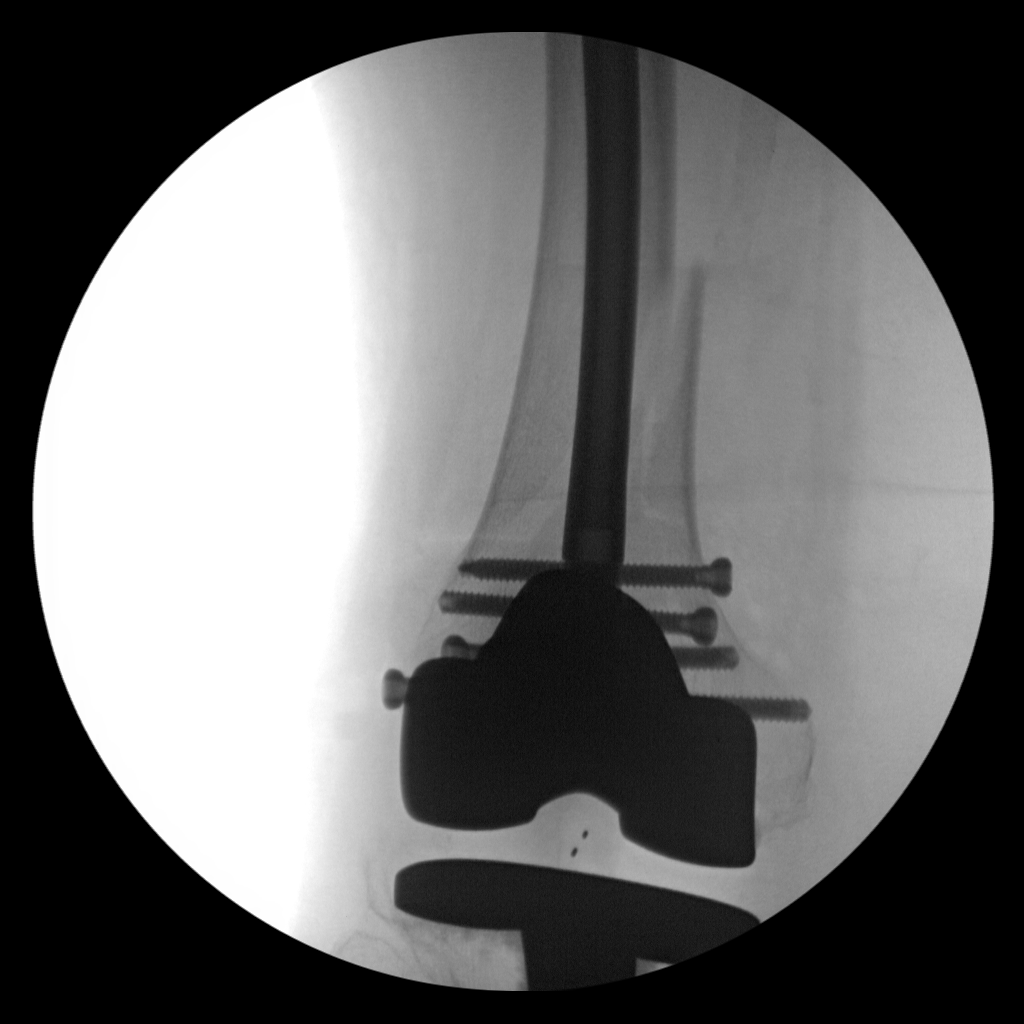

[5 of 5 positions shown; findings below may reference images not displayed]

FINDINGS: An intramedullary rod has been placed through the femur, crossing
the distal femoral fracture, with proximal and distal interlocking
screws.
IMPRESSION: Femoral rod placement as above.

## 2020-07-04 IMAGING — DX PORTABLE RIGHT KNEE - 1-2 VIEW
1 series · 2 of 2 positions shown · non-contrast
Comparison: None.

CLINICAL DATA: ORIF right knee

EXAM:
PORTABLE RIGHT KNEE - 1-2 VIEW

[Series 1: knee · 0.14mm/px · 2 of 2 slices shown]
[im 1/2]
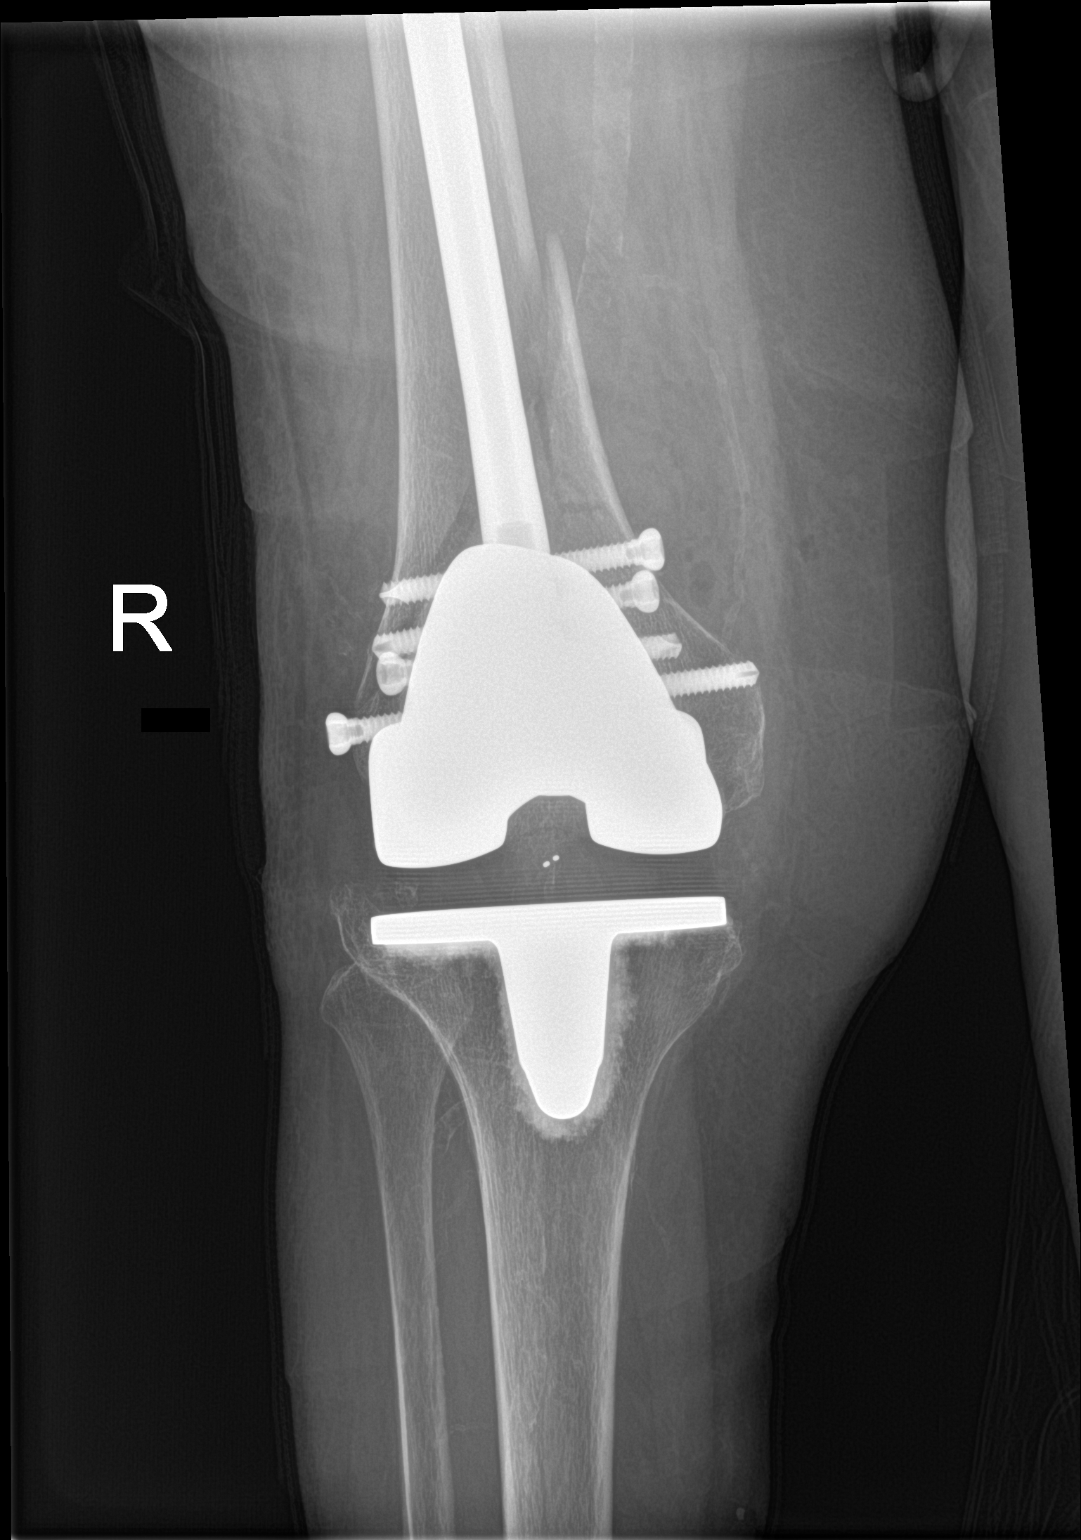
[im 2/2]
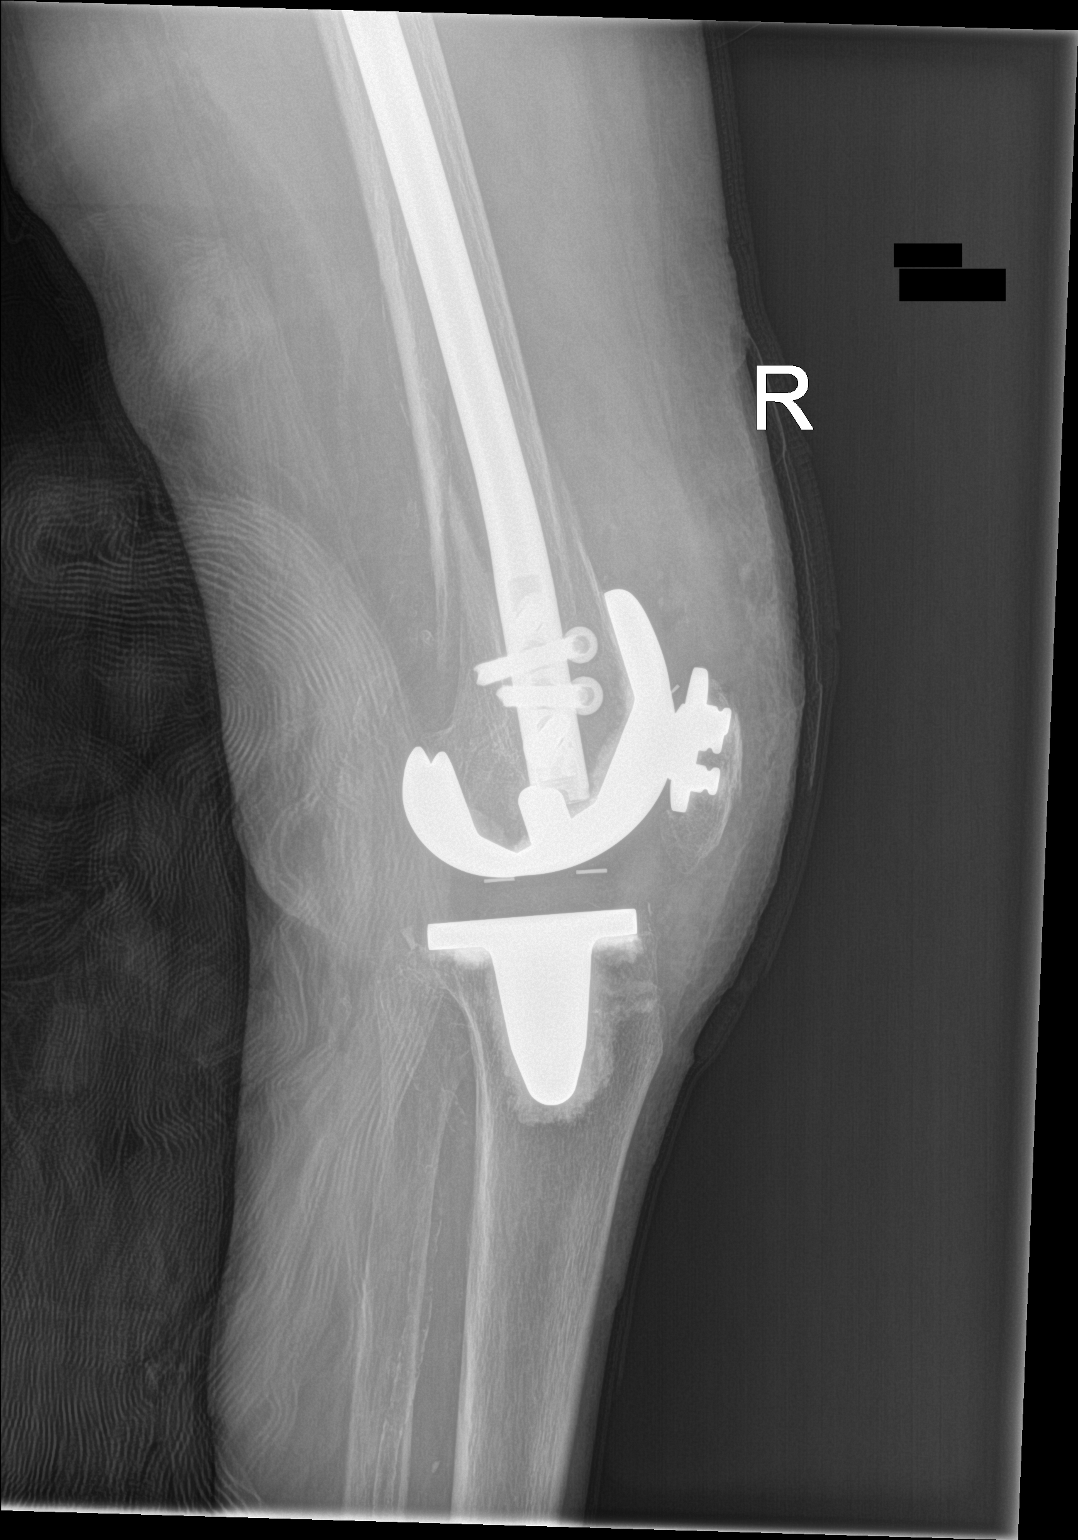

[2 of 2 positions shown; findings below may reference images not displayed]

FINDINGS: An intramedullary rod has been placed through the femur, crossing
the distal femoral fracture with distal interlocking screws. The
patient is status post knee replacement. The femoral and tibial
components are in good position. No other fractures are noted.
IMPRESSION: ORIF of the distal femoral fracture. Knee replacement hardware is in
good position.

## 2020-07-04 IMAGING — DX CHEST - 2 VIEW
2 series · 2 of 2 positions shown · non-contrast
Comparison: Prior radiograph from 10/21/2018

CLINICAL DATA: Initial evaluation status post pacer placement,
history of femur fracture.

EXAM:
CHEST - 2 VIEW

[x chest ap]
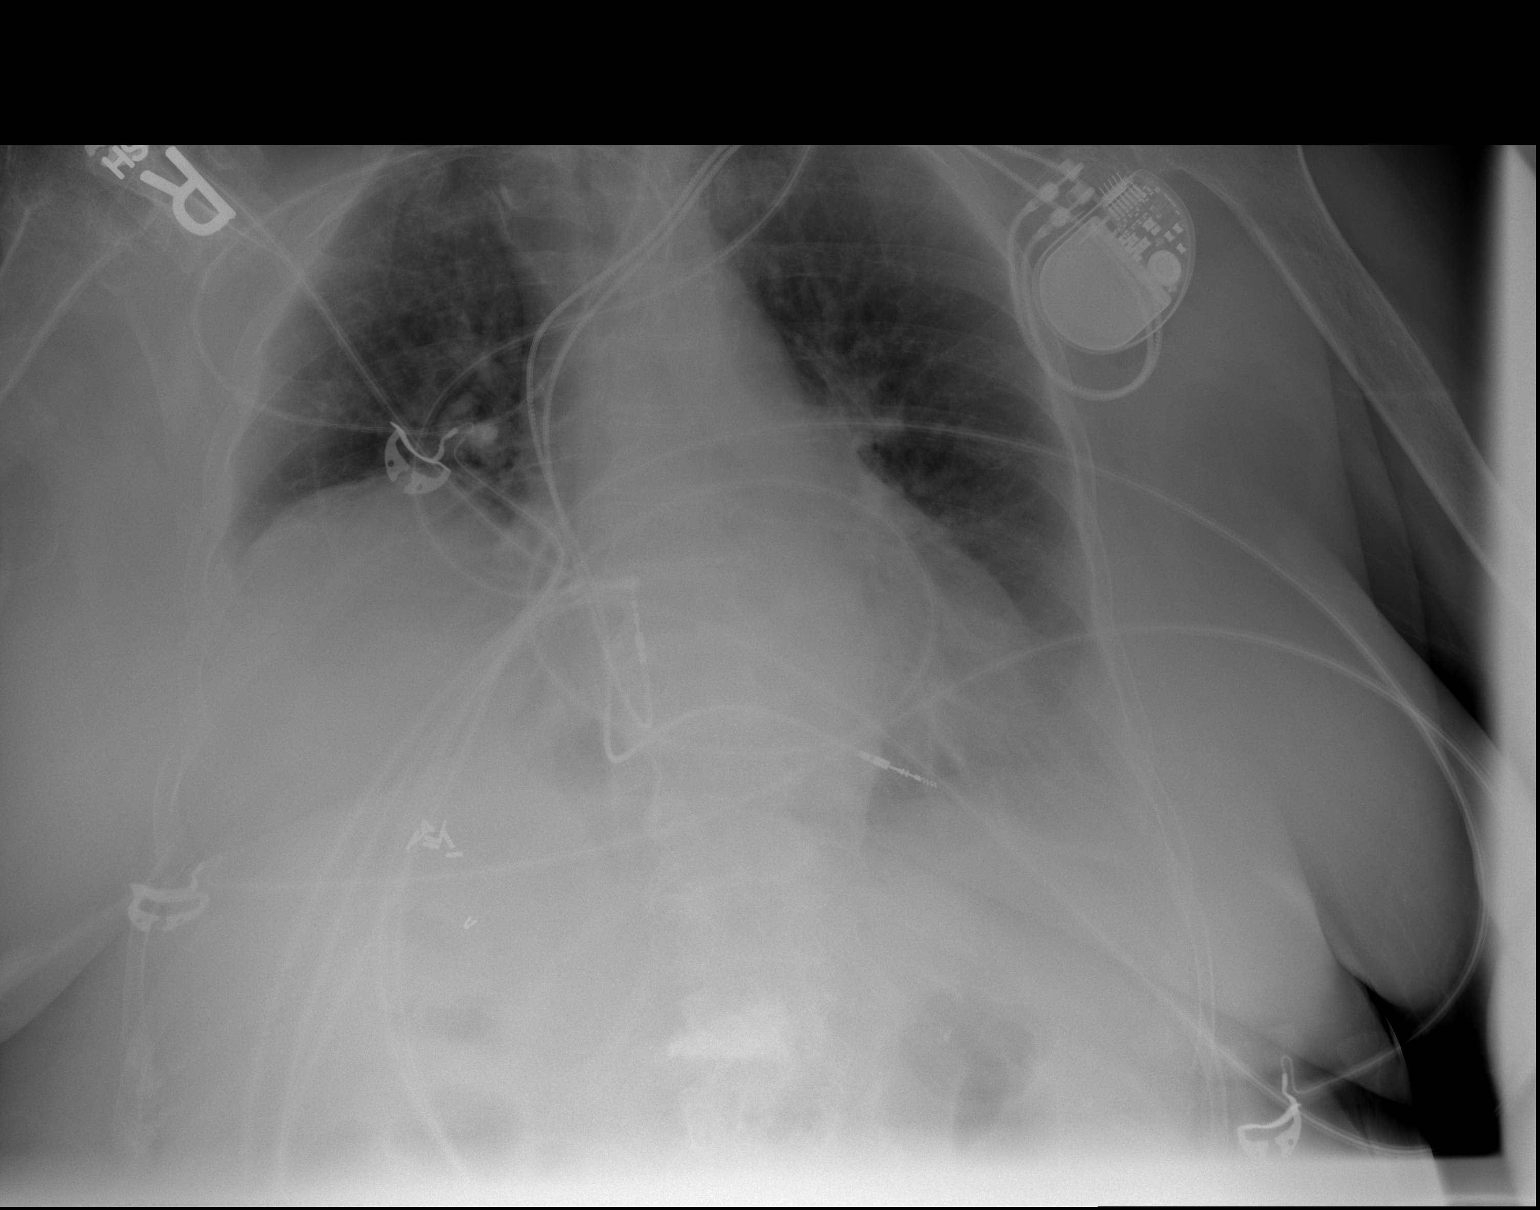

[w chest lat]
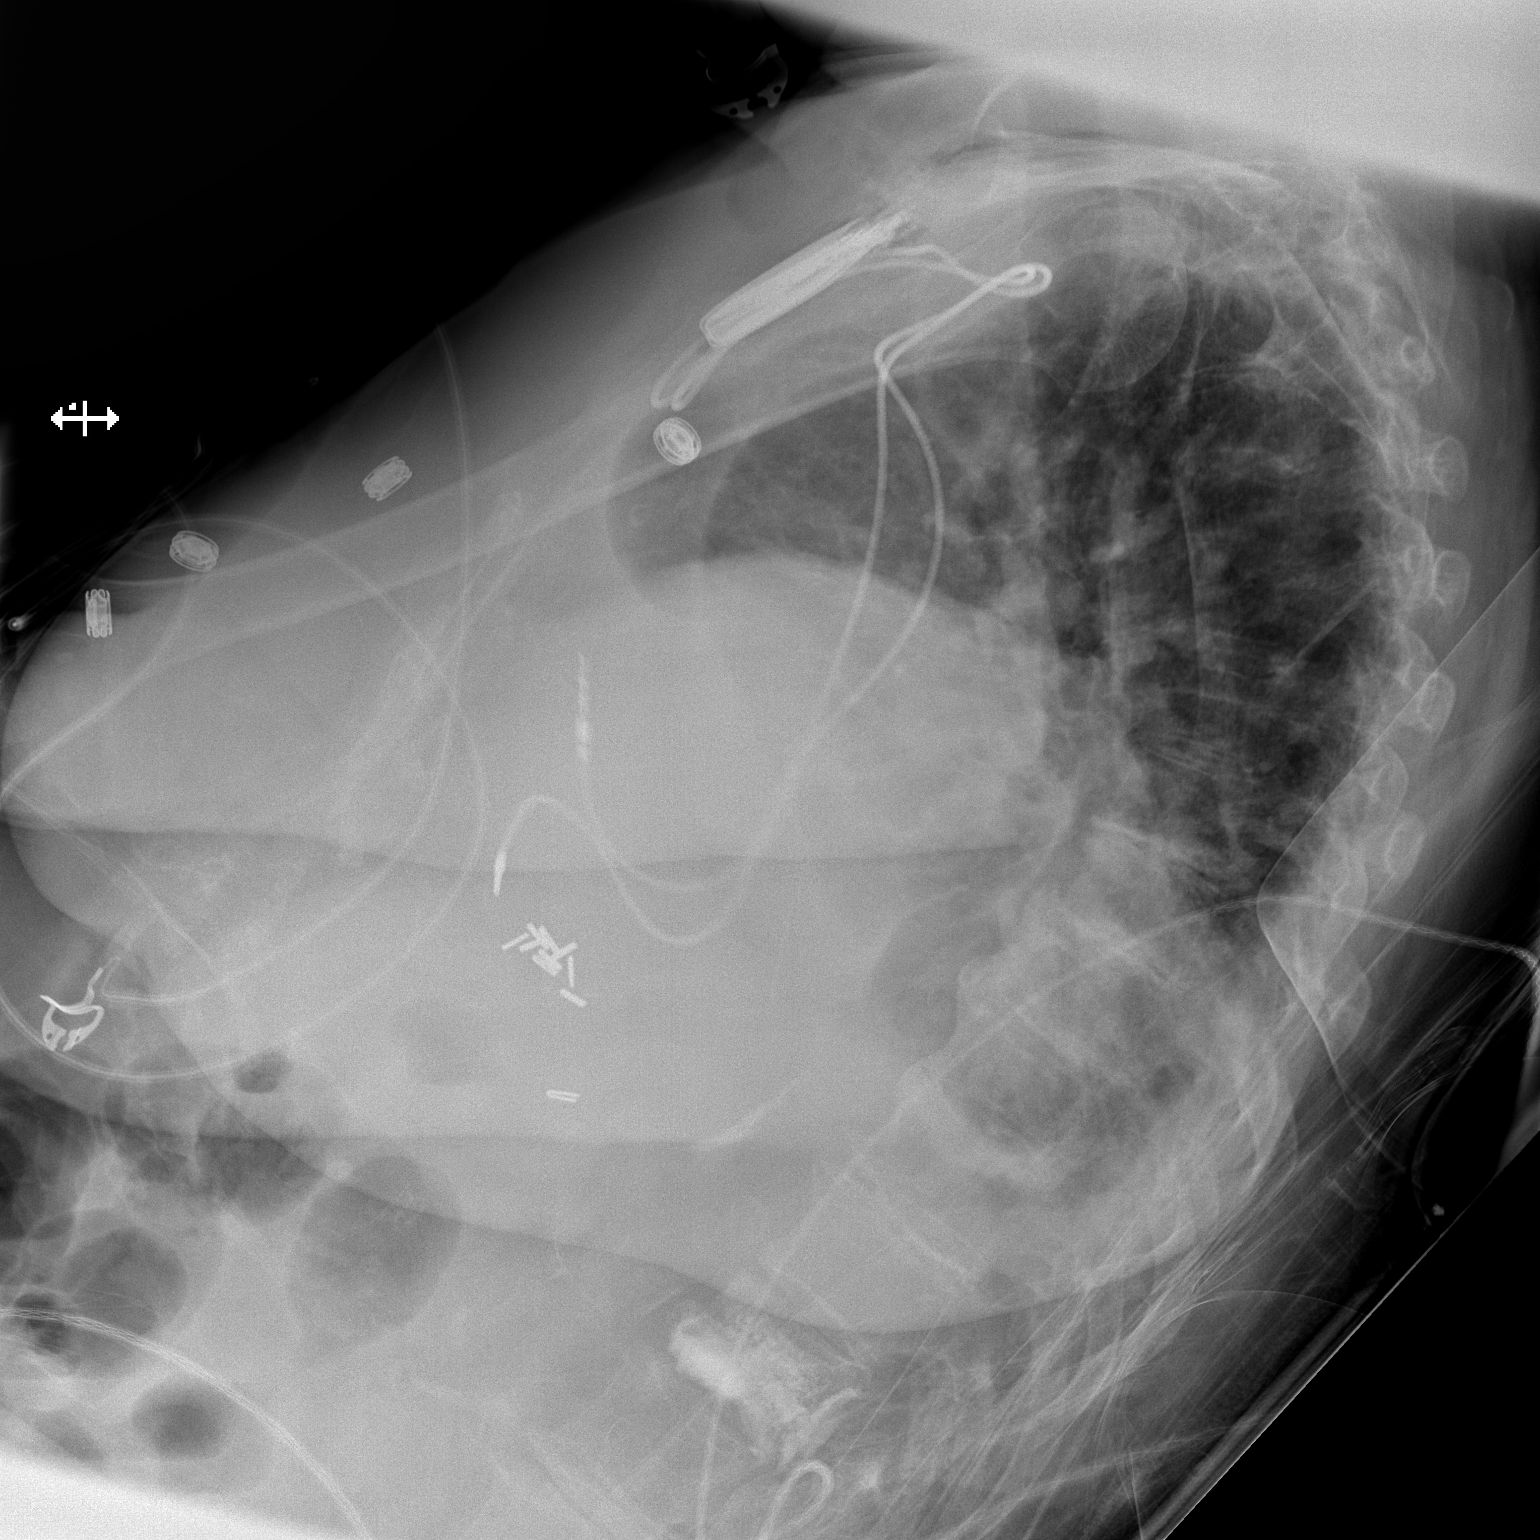

[2 of 2 positions shown; findings below may reference images not displayed]

FINDINGS: Cardiac and mediastinal silhouettes are stable in size and contour,
and remain within normal limits. Dual lead left-sided transvenous
pacemaker/AICD stable in position and alignment with electrodes
overlying the right atrium and right ventricle.

Chronic elevation of the right hemidiaphragm, stable. Associated
mild bibasilar atelectasis. No other new focal airspace disease. No
overt pulmonary edema or significant pleural effusion. No
pneumothorax.

Osseous structures are unchanged. Sequelae of prior vertebral
augmentation noted at L1. Age indeterminate proximal right humeral
fracture again noted.
IMPRESSION: 1. Stable appearance of the chest with chronic elevation of the
right hemidiaphragm and mild bibasilar atelectasis.
2. No other new active cardiopulmonary disease.

## 2020-07-17 ENCOUNTER — Non-Acute Institutional Stay: Payer: Medicare Other | Admitting: Nurse Practitioner

## 2020-07-17 ENCOUNTER — Encounter: Payer: Self-pay | Admitting: Nurse Practitioner

## 2020-07-17 DIAGNOSIS — I509 Heart failure, unspecified: Secondary | ICD-10-CM

## 2020-07-17 DIAGNOSIS — Z515 Encounter for palliative care: Secondary | ICD-10-CM

## 2020-07-17 NOTE — Progress Notes (Signed)
[facility] Consult Note Telephone: [phone]  Fax: [phone]  [name] DOB: Dec 05, 1945 MRN: [id number]  PRIMARY CARE PROVIDER:  [place] PARTY:Self  1.JJK:[id number] goals consists DNR, do not intubate. Wishes are for more conservative care to include antibiotics, IV fluids, blood transfusions, diagnostic testing, lab testing. Wishes are to minimize hospitalizations if necessary.  2.Dyspneic secondary to congestive heart failure remain stable at present time. Continue daily weights and outpatient Cardiology appointments  3.Palliative care encounter; Palliative medicine team will continue to support patient, patient's family, and medical team. Visit consisted of counseling and education dealing with the complex and emotionally intense issues of symptom management and palliative   5. F/u 2 months for ongoing monitoring disease progression, chronic nausea, debility.   I spent 50 minutes providing this consultation, starting at 1:45pm. More than 50% of the time in this consultation was spent coordinating communication.   HISTORY OF PRESENT ILLNESS:  Meghan [name] is a 74 y.o. year old female with multiple medical problems including COPD, congestive heart failure with ef 20%, asthma, collagen vascular disease, coronary artery disease, heart murmur, benign brain tumor, hypertension, gerd, hypercholesterolemia, history of headache, diabetes, gerd, arthritis, osteoporosis, lumbar degenerative disc disease, anxiety, PTSD, depression, kyphoplasty, right hand reconstruction 2011, cholecystectomy, cervical fusion, abdominal hysterectomy. Meghan [name] continues to reside in [place] at [facility]. Meghan [name] does require some assistance with ADLs though is able to ambulate in the room, perform some adl's with limitations. Meghan [name] does toilet and feed  herself. Appetite has been Fair. Meghan Alper endorses she does continue to have chronic intermittent nausea with intermittent diarrhea. Meghan [name] is followed by Psychiatry, Psychotherapy, Dental at the facility. No recent falls, wounds, infections, hospitalizations. Meghan [name] medical goals focus on DNR, do not intubate, do not hospitalized. Last [facility] Care Provider know 79 / 17 / 2021 for monthly follow-up tinea unguium. Staff endorses no new changes are concerns. Current weight is 163.1 with BMI 32.9, stable weight on a regular diet regular texture regular liquid consistency. At present Meghan [name] is sitting up in bed. Meghan. [name] is playing a game on her tablet. Meghan. [name] appears comfortable. No visitors present. I visited and observed Meghan [name]. We talked about purpose of Palliative care visit. Meghan [name] in agreement. We talked about symptoms of pain, shortness of breath, chronic nausea with intermittent diarrhea. We talked about medical goals of care. We talked about complexity of decision-making in addition to chronic disease progression. We talked about quality of life. We talked about challenges residing at the facility. We talked about coping strategies. We talked about role of Palliative care and plan of care. No new changes are recommendations today will continue to follow monitor next visit in 2 months if needed or sooner should she declined. Meghan [name] in agreement, updated staff.  Palliative Care was asked to help to continue to address goals of care.   CODE STATUS: DNR  PPS: 50% HOSPICE ELIGIBILITY/DIAGNOSIS: TBD  PAST MEDICAL HISTORY:  Past Medical History:  Diagnosis Date  . Anginal pain ([place])   . Anxiety   . Arthritis    RA  . Asthma   . Brain tumor (benign) ([place])   . CHF (congestive heart failure) ([place])   . Collagen vascular disease ([facility])   . Complete heart block ([place])   . COPD (chronic obstructive pulmonary disease) ([place])   . Coronary artery disease   .  DDD (degenerative disc  disease)   . Depression   . Diabetes mellitus   . GERD (gastroesophageal reflux disease)   . Headache   . Heart murmur   . Hypercholesteremia   . Hypertension   . Lumbar degenerative disc disease   . Migraines   . Obesity   . Osteopenia   . Osteoporosis   . Pacemaker- [employer] Jude   . Pneumonia   . PTSD (post-traumatic stress disorder)     SOCIAL HX:  Social History   Tobacco Use  . Smoking status: Never Smoker  . Smokeless tobacco: Never Used  Substance Use Topics  . Alcohol use: No    Alcohol/week: 0.0 standard drinks    ALLERGIES:  Allergies  Allergen Reactions  . Amoxicillin Itching and Other (See Comments)    Has patient had a PCN reaction causing immediate rash, facial/tongue/throat swelling, SOB or lightheadedness with hypotension: Unknown Has patient had a PCN reaction causing severe rash involving mucus membranes or skin necrosis: Unknown Has patient had a PCN reaction that required hospitalization: Unknown Has patient had a PCN reaction occurring within the last 10 years: Unknown If all of the above answers are "NO", then may proceed with Cephalosporin use.   . Gabapentin Other (See Comments)    Pt states that it causes her BP to drop.   . Naproxen Hives  . Nsaids Other (See Comments)    Reaction:  Unknown   . Tolmetin Other (See Comments)    Reaction:  Unknown      PHYSICAL EXAM:   General: NAD, frail appearing,pleasant female Cardiovascular: regular rate and rhythm Pulmonary: clear ant fields Neurological: ambulatory Sylvan Sookdeo Ihor Gully, NP

## 2020-07-18 ENCOUNTER — Other Ambulatory Visit: Payer: Self-pay

## 2020-07-22 ENCOUNTER — Ambulatory Visit (INDEPENDENT_AMBULATORY_CARE_PROVIDER_SITE_OTHER): Payer: Medicare Other

## 2020-07-22 DIAGNOSIS — I442 Atrioventricular block, complete: Secondary | ICD-10-CM | POA: Diagnosis not present

## 2020-07-23 LAB — CUP PACEART REMOTE DEVICE CHECK
Battery Remaining Longevity: 111 mo
Battery Remaining Percentage: 95.5 %
Battery Voltage: 2.99 V
Brady Statistic AP VP Percent: 1 %
Brady Statistic AP VS Percent: 1 %
Brady Statistic AS VP Percent: 99 %
Brady Statistic AS VS Percent: 1 %
Brady Statistic RA Percent Paced: 1 %
Brady Statistic RV Percent Paced: 99 %
Date Time Interrogation Session: 20211213020021
Implantable Lead Implant Date: 20200313
Implantable Lead Implant Date: 20200313
Implantable Lead Location: 753859
Implantable Lead Location: 753860
Implantable Lead Model: 5076
Implantable Lead Model: 5076
Implantable Pulse Generator Implant Date: 20200313
Lead Channel Impedance Value: 460 Ohm
Lead Channel Impedance Value: 560 Ohm
Lead Channel Pacing Threshold Amplitude: 0.5 V
Lead Channel Pacing Threshold Amplitude: 0.75 V
Lead Channel Pacing Threshold Pulse Width: 0.5 ms
Lead Channel Pacing Threshold Pulse Width: 0.5 ms
Lead Channel Sensing Intrinsic Amplitude: 11.5 mV
Lead Channel Sensing Intrinsic Amplitude: 2.6 mV
Lead Channel Setting Pacing Amplitude: 2 V
Lead Channel Setting Pacing Amplitude: 2.5 V
Lead Channel Setting Pacing Pulse Width: 0.5 ms
Lead Channel Setting Sensing Sensitivity: 4 mV
Pulse Gen Model: 2272
Pulse Gen Serial Number: 9118930

## 2020-08-06 NOTE — Progress Notes (Signed)
Remote pacemaker transmission.   

## 2020-08-23 ENCOUNTER — Other Ambulatory Visit: Payer: Self-pay | Admitting: Ophthalmology

## 2020-08-23 DIAGNOSIS — H34231 Retinal artery branch occlusion, right eye: Secondary | ICD-10-CM

## 2020-08-29 ENCOUNTER — Ambulatory Visit: Payer: Medicare Other | Attending: Ophthalmology

## 2020-10-02 ENCOUNTER — Other Ambulatory Visit: Payer: Self-pay

## 2020-10-02 ENCOUNTER — Non-Acute Institutional Stay: Payer: Medicare Other | Admitting: Nurse Practitioner

## 2020-10-02 ENCOUNTER — Emergency Department: Payer: Medicare Other

## 2020-10-02 ENCOUNTER — Encounter: Payer: Self-pay | Admitting: Nurse Practitioner

## 2020-10-02 ENCOUNTER — Emergency Department
Admission: EM | Admit: 2020-10-02 | Discharge: 2020-10-02 | Disposition: A | Discharge status: Home / Self Care | Payer: Medicare Other | Attending: Emergency Medicine | Admitting: Emergency Medicine

## 2020-10-02 VITALS — Resp 18 | Wt 157.8 lb

## 2020-10-02 DIAGNOSIS — E119 Type 2 diabetes mellitus without complications: Secondary | ICD-10-CM | POA: Insufficient documentation

## 2020-10-02 DIAGNOSIS — Z96653 Presence of artificial knee joint, bilateral: Secondary | ICD-10-CM | POA: Insufficient documentation

## 2020-10-02 DIAGNOSIS — I110 Hypertensive heart disease with heart failure: Secondary | ICD-10-CM | POA: Diagnosis not present

## 2020-10-02 DIAGNOSIS — W06XXXA Fall from bed, initial encounter: Secondary | ICD-10-CM | POA: Diagnosis not present

## 2020-10-02 DIAGNOSIS — J45901 Unspecified asthma with (acute) exacerbation: Secondary | ICD-10-CM | POA: Insufficient documentation

## 2020-10-02 DIAGNOSIS — I509 Heart failure, unspecified: Secondary | ICD-10-CM

## 2020-10-02 DIAGNOSIS — J449 Chronic obstructive pulmonary disease, unspecified: Secondary | ICD-10-CM | POA: Insufficient documentation

## 2020-10-02 DIAGNOSIS — R519 Headache, unspecified: Secondary | ICD-10-CM | POA: Insufficient documentation

## 2020-10-02 DIAGNOSIS — Z950 Presence of cardiac pacemaker: Secondary | ICD-10-CM | POA: Diagnosis not present

## 2020-10-02 DIAGNOSIS — M542 Cervicalgia: Secondary | ICD-10-CM | POA: Insufficient documentation

## 2020-10-02 DIAGNOSIS — W19XXXA Unspecified fall, initial encounter: Secondary | ICD-10-CM

## 2020-10-02 DIAGNOSIS — R0789 Other chest pain: Secondary | ICD-10-CM | POA: Insufficient documentation

## 2020-10-02 DIAGNOSIS — Z7952 Long term (current) use of systemic steroids: Secondary | ICD-10-CM | POA: Diagnosis not present

## 2020-10-02 DIAGNOSIS — Z96692 Finger-joint replacement of left hand: Secondary | ICD-10-CM | POA: Diagnosis not present

## 2020-10-02 DIAGNOSIS — Z79899 Other long term (current) drug therapy: Secondary | ICD-10-CM | POA: Diagnosis not present

## 2020-10-02 DIAGNOSIS — Z96661 Presence of right artificial ankle joint: Secondary | ICD-10-CM | POA: Insufficient documentation

## 2020-10-02 DIAGNOSIS — M25551 Pain in right hip: Secondary | ICD-10-CM | POA: Insufficient documentation

## 2020-10-02 DIAGNOSIS — M25511 Pain in right shoulder: Secondary | ICD-10-CM | POA: Diagnosis present

## 2020-10-02 DIAGNOSIS — I2510 Atherosclerotic heart disease of native coronary artery without angina pectoris: Secondary | ICD-10-CM | POA: Diagnosis not present

## 2020-10-02 DIAGNOSIS — I5023 Acute on chronic systolic (congestive) heart failure: Secondary | ICD-10-CM | POA: Insufficient documentation

## 2020-10-02 DIAGNOSIS — Z515 Encounter for palliative care: Secondary | ICD-10-CM

## 2020-10-02 LAB — CBC WITH DIFFERENTIAL/PLATELET
Abs Immature Granulocytes: 0.02 10*3/uL (ref 0.00–0.07)
Basophils Absolute: 0 10*3/uL (ref 0.0–0.1)
Basophils Relative: 0 %
Eosinophils Absolute: 0 10*3/uL (ref 0.0–0.5)
Eosinophils Relative: 1 %
HCT: 35.7 % — ABNORMAL LOW (ref 36.0–46.0)
Hemoglobin: 11.8 g/dL — ABNORMAL LOW (ref 12.0–15.0)
Immature Granulocytes: 0 %
Lymphocytes Relative: 20 %
Lymphs Abs: 1.1 10*3/uL (ref 0.7–4.0)
MCH: 29.9 pg (ref 26.0–34.0)
MCHC: 33.1 g/dL (ref 30.0–36.0)
MCV: 90.4 fL (ref 80.0–100.0)
Monocytes Absolute: 0.4 10*3/uL (ref 0.1–1.0)
Monocytes Relative: 7 %
Neutro Abs: 4 10*3/uL (ref 1.7–7.7)
Neutrophils Relative %: 72 %
Platelets: 142 10*3/uL — ABNORMAL LOW (ref 150–400)
RBC: 3.95 MIL/uL (ref 3.87–5.11)
RDW: 13.4 % (ref 11.5–15.5)
WBC: 5.6 10*3/uL (ref 4.0–10.5)
nRBC: 0 % (ref 0.0–0.2)

## 2020-10-02 LAB — BASIC METABOLIC PANEL
Anion gap: 7 (ref 5–15)
BUN: 10 mg/dL (ref 8–23)
CO2: 27 mmol/L (ref 22–32)
Calcium: 7.9 mg/dL — ABNORMAL LOW (ref 8.9–10.3)
Chloride: 105 mmol/L (ref 98–111)
Creatinine, Ser: 0.71 mg/dL (ref 0.44–1.00)
GFR, Estimated: >60 mL/min (ref >60)
Glucose, Bld: 93 mg/dL (ref 70–99)
Potassium: 3.3 mmol/L — ABNORMAL LOW (ref 3.5–5.1)
Sodium: 139 mmol/L (ref 135–145)

## 2020-10-02 LAB — CBG MONITORING, ED: Glucose-Capillary: 96 mg/dL (ref 70–99)

## 2020-10-02 MED ORDER — SODIUM CHLORIDE 0.9 % IV BOLUS
500.0000 mL | Freq: Once | INTRAVENOUS | Status: AC
  Administered 2020-10-02: 500 mL via INTRAVENOUS

## 2020-10-02 MED ORDER — ACETAMINOPHEN 500 MG PO TABS
1000.0000 mg | ORAL_TABLET | Freq: Once | ORAL | Status: AC
  Administered 2020-10-02: 1000 mg via ORAL
  Filled 2020-10-02: qty 2

## 2020-10-02 MED ORDER — IOHEXOL 300 MG/ML  SOLN
100.0000 mL | Freq: Once | INTRAMUSCULAR | Status: AC | PRN
  Administered 2020-10-02: 100 mL via INTRAVENOUS

## 2020-10-02 NOTE — ED Provider Notes (Signed)
[facility] Emergency Department Provider Note ____________________________________________   Event Date/Time   First MD Initiated Contact with Patient 10/02/20 [phone]     (approximate)  I have reviewed the triage vital signs and the nursing notes.  HISTORY  Chief Complaint Fall   HPI Meghan [name] is a 75 y.o. femalewho presents to the ED for evaluation of a fall.   Chart review indicates history of obesity, HTN, HLD, GERD, COPD. Patient resides at a local SNF and ambulates with a walker at baseline.  Patient presents to the ED via EMS for evaluation of right-sided pain after fall from bed. Patient reports accidentally rolling out of bed this evening, onto her right side, causing injury and pain primarily to her head, right shoulder and right hip. Currently reporting 7/10 intensity pain to each location listed above, aching and constant. She denies any syncope, assault.   Past Medical History:  Diagnosis Date  . Anginal pain ([place])   . Anxiety   . Arthritis    RA  . Asthma   . Brain tumor (benign) ([place])   . CHF (congestive heart failure) ([place])   . Collagen vascular disease ([place])   . Complete heart block ([place])   . COPD (chronic obstructive pulmonary disease) ([place])   . Coronary artery disease   . DDD (degenerative disc disease)   . Depression   . Diabetes mellitus   . GERD (gastroesophageal reflux disease)   . Headache   . Heart murmur   . Hypercholesteremia   . Hypertension   . Lumbar degenerative disc disease   . Migraines   . Obesity   . Osteopenia   . Osteoporosis   . Pacemaker- St Jude   . Pneumonia   . PTSD (post-traumatic stress disorder)     Patient Active Problem List   Diagnosis Date Noted  . Dyspnea 11/08/2018  . Complete heart block ([place]) 10/21/2018  . Closed fracture of distal end of right femur ([place]), periprosthetic 10/21/2018  . Palliative care encounter 09/01/2018  . Weakness generalized 09/01/2018  . Shortness of  breath 09/01/2018  . Syncope 05/05/2017  . Acute on chronic systolic CHF (congestive heart failure) ([place]) 04/16/2017  . Acute lower UTI 04/16/2017  . Acute respiratory failure ([place]) 04/16/2017  . Pressure injury of skin 03/22/2017  . Acute on chronic respiratory failure with hypoxia ([place]) 03/20/2017  . Acute respiratory failure with hypoxia ([place]) 03/20/2017  . Near syncope 02/27/2017  . Dehydration 02/27/2017  . Colitis 02/22/2017  . Seizure ([place]) 01/03/2016  . Chronic tension-type headache, intractable 12/04/2015  . 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 Degeneration of intervertebral disc of lumbar region 07/15/2015  . 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 Asthma with acute exacerbation 03/10/2015  . Hypokalemia 03/01/2015  . Hyponatremia 03/01/2015  . DDD (degenerative disc disease), lumbar 01/31/2015  . Arthritis, degenerative 01/31/2015  . Rheumatoid arthritis with rheumatoid factor ([place]) 01/31/2015  . HTN (hypertension) 12/24/2014  . Sepsis ([place]) 12/24/2014  . Left knee pain 12/24/2014  . GERD (gastroesophageal reflux disease) 12/24/2014  . COPD (chronic obstructive pulmonary disease) ([place]) 12/24/2014  . Depression 12/24/2014  . Anxiety 12/24/2014  . Severe bipolar disorder with psychotic features, mood-congruent ([place]) 11/16/2014  . Neurosis, posttraumatic 11/16/2014  . H/O gastric ulcer 11/16/2014  . Barton's fracture of distal radius, closed 09/19/2014  . Neuritis or radiculitis due to rupture of lumbar intervertebral disc 05/11/2014  . Cervico-occipital neuralgia 03/26/2014  . Difficulty in walking 03/26/2014  . Difficulty in walking,  not elsewhere classified 03/26/2014  . Cervical spine syndrome 01/26/2014  . Cephalalgia 01/09/2014  . Disordered sleep 01/09/2014  . BP (high blood pressure) 10/19/2013  . Adiposity 10/19/2013  . Cardiac murmur 10/19/2013  . Breath shortness 10/19/2013  . Chronic obstructive pulmonary disease ([place]) 10/19/2013  .  Diabetes mellitus ([place]) 10/19/2013    Past Surgical History:  Procedure Laterality Date  . ABDOMINAL HYSTERECTOMY    . CERVICAL FUSION    . CHOLECYSTECTOMY    . COLONOSCOPY WITH PROPOFOL N/A 09/20/2015   Procedure: COLONOSCOPY WITH PROPOFOL;  Surgeon: Josefine Class, MD;  Location: [facility] ENDOSCOPY;  Service: Endoscopy;  Laterality: N/A;  . COLONOSCOPY WITH PROPOFOL N/A 01/27/2016   Procedure: COLONOSCOPY WITH PROPOFOL;  Surgeon: Manya Silvas, MD;  Location: [facility] ENDOSCOPY;  Service: Endoscopy;  Laterality: N/A;  . FINGER ARTHROPLASTY  02/23/2012   Procedure: FINGER ARTHROPLASTY;  Surgeon: Cammie Sickle., MD;  Location: [place];  Service: Orthopedics;  Laterality: Left;  Extensor carpi radialis longus to Extensor carpi ulnaris transfer, left Metaphalangeal reconstructions of index and long fingers,  . FOOT ARTHROPLASTY     toes x2 rt foot  . HAND RECONSTRUCTION  2011   right-multiple finger joint reconst  . JOINT REPLACEMENT     bilat knee replacements  . KYPHOPLASTY N/A 06/14/2017   Procedure: KYPHOPLASTY L1;  Surgeon: Hessie Knows, MD;  Location: [facility] ORS;  Service: Orthopedics;  Laterality: N/A;  . LOOP RECORDER INSERTION N/A 06/23/2017   Procedure: LOOP RECORDER INSERTION;  Surgeon: Isaias Cowman, MD;  Location: [place] CV LAB;  Service: Cardiovascular;  Laterality: N/A;  . LOOP RECORDER REMOVAL N/A 10/21/2018   Procedure: LOOP RECORDER REMOVAL;  Surgeon: Deboraha Sprang, MD;  Location: [place] [facility];  Service: Cardiovascular;  Laterality: N/A;  . ORIF FEMUR FRACTURE Right 10/22/2018   Procedure: OPEN REDUCTION INTERNAL FIXATION (ORIF) DISTAL FEMUR FRACTURE;  Surgeon: Marchia Bond, MD;  Location: [place];  Service: Orthopedics;  Laterality: Right;  . PACEMAKER IMPLANT N/A 10/21/2018   Procedure: PACEMAKER IMPLANT;  Surgeon: Deboraha Sprang, MD;  Location: [place] [facility];  Service: Cardiovascular;  Laterality: N/A;    Prior to Admission  medications   Medication Sig Start Date End Date Taking? Authorizing Provider  albuterol (VENTOLIN HFA) 108 (90 Base) MCG/ACT inhaler Inhale 1-2 puffs into the lungs every 6 (six) hours as needed for wheezing or shortness of breath.    [provider]  busPIRone (BUSPAR) 7.5 MG tablet Take 1 tablet by mouth 2 (two) times daily.    [provider]  Calcium Carb-Cholecalciferol (CALCIUM 500/D) 500-400 MG-UNIT CHEW Chew by mouth daily.    [provider]  Cholecalciferol 1.25 MG (50000 UT) TABS Take by mouth once a week.    [provider]  clonazePAM (KLONOPIN) 0.5 MG tablet Take 0.125 mg by mouth 2 (two) times daily.    [provider]  cyanocobalamin 500 MCG tablet Take 500 mcg by mouth daily.    [provider]  diclofenac sodium (VOLTAREN) 1 % GEL Apply 2 g topically 3 (three) times daily. Apply to the fingers    [provider]  escitalopram (LEXAPRO) 10 MG tablet Take 1 tablet (10 mg total) by mouth every morning. Patient taking differently: Take 20 mg by mouth every morning.  09/11/16   Rainey Pines, MD  fluticasone (VERAMYST) 27.5 MCG/SPRAY nasal spray Place 2 sprays into the nose 2 (two) times daily.    [provider]  folic acid (FOLVITE) 1 MG tablet Take 1 mg by mouth daily.    [provider]  furosemide (LASIX) 40 MG tablet Take 40 mg by mouth 2 (two) times daily.    [provider]  Ipratropium-Albuterol (COMBIVENT RESPIMAT) 20-100 MCG/ACT AERS respimat Inhale 1 puff into the lungs every 6 (six) hours.    [provider]  lamoTRIgine (LAMICTAL) 100 MG tablet Take 1 tablet (100 mg total) by mouth daily. 06/02/16   Rainey Pines, MD  loperamide (IMODIUM A-D) 2 MG tablet Take 1 tablet (2 mg total) by mouth 4 (four) times daily as needed for diarrhea or loose stools. Patient taking differently: Take 2 mg by mouth as needed for diarrhea or loose stools.  05/10/17   Eula Listen, MD   loratadine (CLARITIN) 10 MG tablet Take 10 mg by mouth daily.    [provider]  methotrexate (RHEUMATREX) 7.5 MG tablet Take 7.5 mg by mouth once a week. Caution" Chemotherapy. Protect from light.    [provider]  mirtazapine (REMERON) 7.5 MG tablet Take 7.5 mg by mouth at bedtime.     [provider]  omeprazole (PRILOSEC) 20 MG capsule Take 20 mg by mouth daily.     [provider]  oxyCODONE-acetaminophen (PERCOCET/ROXICET) 5-325 MG tablet Take 1 tablet by mouth every 4 (four) hours as needed for moderate pain or severe pain. 10/24/18   Charlynne Cousins, MD  oxyCODONE-acetaminophen (PERCOCET/ROXICET) 5-325 MG tablet Take 1 tablet by mouth 2 (two) times a day. scheduled    [provider]  Potassium Chloride ER 20 MEQ TBCR Take 20 mEq by mouth 2 (two) times daily. 05/07/17   Vaughan Basta, MD  pramipexole (MIRAPEX) 0.25 MG tablet Take 0.25 mg by mouth daily.     [provider]  saxagliptin HCl (ONGLYZA) 5 MG TABS tablet Take 5 mg by mouth at bedtime.    [provider]  Tofacitinib Citrate 5 MG TABS Take 5 mg by mouth 2 (two) times daily after a meal.     [provider]  topiramate (TOPAMAX) 25 MG tablet Take 25 mg by mouth at bedtime.     [provider]    Allergies Amoxicillin, Gabapentin, Iodine, Naproxen, Nsaids, and Tolmetin  Family History  Problem Relation Age of Onset  . Depression Sister   . Migraines Sister   . Hypertension Mother   . Arthritis/Rheumatoid Mother   . Heart attack Father   . Hypertension Father   . CAD Other   . Hypertension Other   . Diabetes Mellitus II Other   . Arthritis Other     Social History Social History   Tobacco Use  . Smoking status: Never Smoker  . Smokeless tobacco: Never Used  Vaping Use  . Vaping Use: Never used  Substance Use Topics  . Alcohol use: No    Alcohol/week: 0.0 standard drinks  . Drug use: No    Review of  Systems  Constitutional: No fever/chills Eyes: No visual changes. ENT: No sore throat. Cardiovascular: Denies chest pain. Respiratory: Denies shortness of breath. Gastrointestinal: No abdominal pain.  No nausea, no vomiting.  No diarrhea.  No constipation. Genitourinary: Negative for dysuria. Musculoskeletal: Positive for right-sided shoulder and right hip pain. Skin: Negative for rash. Neurological: Negative for  focal weakness or numbness. Positive for headache.   ____________________________________________   PHYSICAL EXAM:  VITAL SIGNS: Vitals:   10/02/20 0340 10/02/20 0430  BP: 111/71 104/62  Pulse: 87 88  Resp: Marland Kitchen)  23 17  Temp:    SpO2: 100% 100%    Constitutional: Alert and oriented. Appears uncomfortable and is keeping her eyes closed. Conversational in full sentences and follows commands in all 4 extremities. Eyes: Conjunctivae are normal. PERRL. EOMI. Head: Superficial abrasions to her right parietal scalp noted. No discrete laceration or no bleeding. Nose: No congestion/rhinnorhea. Mouth/Throat: Mucous membranes are moist.  Oropharynx non-erythematous. Neck: No stridor. No cervical spine tenderness to palpation. Cardiovascular: Normal rate, regular rhythm. Grossly normal heart sounds.  Good peripheral circulation. Respiratory: Normal respiratory effort.  No retractions.  Gastrointestinal: Soft , nondistended, diffuse tenderness without localizing or peritoneal features. Musculoskeletal: Tenderness to the right shoulder without evidence of laceration or open injury. Right arm is distally neurovascularly intact. Tenderness throughout bilateral chest wall with AP and lateral compression of her rib cage. No spinal step-offs or signs of trauma to the thoracic or lumbar back. She does report tenderness throughout her cervical spine, but has no bony step-offs or discrete overlying signs of trauma. Right hip pain with logrolling of the right lower extremity. Well-healed  surgical incision noted to the right knee without tenderness to the right knee. Right leg is distally neurovascularly intact. Palpation of left-sided extremities demonstrates nontender left arm and leg. Neurologic:  Normal speech and language. No gross focal neurologic deficits are appreciated. Skin:  Skin is warm, dry and intact. No rash noted. Psychiatric: Mood and affect are normal. Speech and behavior are normal.  ____________________________________________   LABS (all labs ordered are listed, but only abnormal results are displayed)  Labs Reviewed  CBC WITH DIFFERENTIAL/PLATELET - Abnormal; Notable for the following components:      Result Value   Hemoglobin 11.8 (*)    HCT 35.7 (*)    Platelets 142 (*)    All other components within normal limits  BASIC METABOLIC PANEL - Abnormal; Notable for the following components:   Potassium 3.3 (*)    Calcium 7.9 (*)    All other components within normal limits  CBG MONITORING, ED   ____________________________________________  12 Lead EKG  Paced rhythm, rate of 91 bpm. Normal axis and appropriate intervals with LBBB morphology. No evidence of acute ischemia ____________________________________________  RADIOLOGY  ED MD interpretation: Plain films of the right shoulder and right hip reviewed by me without evidence of acute bony injury. CT head reviewed by me without evidence of acute ICH. Pain CT imaging reviewed by me without evidence of acute traumatic pathology  Official radiology report(s): DG Shoulder Right  Result Date: 10/02/2020 CLINICAL DATA:  Status post fall. EXAM: RIGHT SHOULDER - 2+ VIEW COMPARISON:  March 13, 2018 FINDINGS: There is no evidence of an acute fracture or dislocation. A chronic fracture deformity is seen involving the head and surgical neck of the proximal right humerus. Degenerative changes are seen involving the right acromioclavicular joint and right glenohumeral articulation. Of incidental note is  the presence of a small right pleural effusion. Soft tissues are unremarkable. IMPRESSION: 1. Chronic fracture deformity of the proximal right humerus without evidence of acute osseous abnormality. Electronically Signed   By: Virgina Norfolk M.D.   On: 10/02/2020 03:18   CT Head Wo Contrast  Result Date: 10/02/2020 CLINICAL DATA:  Fall from bed EXAM: CT HEAD WITHOUT CONTRAST CT CERVICAL SPINE WITHOUT CONTRAST CT CHEST, ABDOMEN AND PELVIS WITH CONTRAST TECHNIQUE: Contiguous axial images were obtained from the base of the skull through the vertex without intravenous contrast. Multidetector CT imaging of the cervical spine was performed without intravenous  contrast. Multiplanar CT image reconstructions were also generated. Multidetector CT imaging of the chest, abdomen and pelvis was performed following the standard protocol during bolus administration of intravenous contrast. CONTRAST:  193mL OMNIPAQUE IOHEXOL 300 MG/ML  SOLN COMPARISON:  Same day radiographs, CT head 10/20/2018, MR thoracic and lumbar spine 01/29/2020, CT abdomen pelvis 05/10/2017 CT angio chest 02/24/2017 FINDINGS: CT HEAD FINDINGS Brain: Right cerebellar encephalomalacia subjacent to a suboccipital craniectomy site compatible prior mass resection. No evidence of acute infarction, hemorrhage, hydrocephalus, extra-axial collection, new visible mass lesion or mass effect. Symmetric prominence of the ventricles, cisterns and sulci compatible with parenchymal volume loss. Patchy areas of white matter hypoattenuation are most compatible with chronic microvascular angiopathy. Vascular: Atherosclerotic calcification of the carotid siphons and intradural vertebral arteries. No hyperdense vessel. Skull: No calvarial fracture or suspicious osseous lesion. No scalp swelling or hematoma. Postsurgical changes of prior right suboccipital craniectomy without acute complication or significant interval change. Sinuses/Orbits: Anterior nasal septal perforation.  Minimal thickening in the ethmoids and maxillary sinuses. No layering air-fluid levels or pneumatized secretions. Small bilateral mastoid effusions. Middle ear cavities are clear. Other: None. CT CERVICAL FINDINGS Alignment: Stabilization collar absent. Straightening of normal cervical lordosis. Mild leftward lateral flexion. Leftward cranial rotation as well. No evidence of traumatic listhesis. No abnormally widened, perched or jumped facets. Normal alignment of the craniocervical and atlantoaxial articulations. Degenerative anterolisthesis C4 on C5 and C7 on T1. No evidence of traumatic listhesis. No abnormally widened, perched or jumped facets. Normal alignment of the craniocervical and atlantoaxial articulations. Skull base and vertebrae: No acute skull base fracture. No vertebral body fracture or height loss. Normal bone mineralization. Bony ankylosis of the C5-6 vertebral bodies and left articular facets. Multilevel spondylitic changes as below. Additional arthrosis at the atlantodental and basion dens intervals. Soft tissues and spinal canal: No pre or paravertebral fluid or swelling. No visible canal hematoma. Disc levels: Multilevel intervertebral disc height loss with spondylitic endplate changes. Slightly more pronounced disc osteophyte complexes and posterior osseous ridging C4-5 and C5-6 respectively result in some mild canal stenosis. Multilevel uncinate spurring and facet hypertrophic changes result in mild-to-moderate multilevel neural foraminal narrowing most pronounced on the left at C3-4 and bilaterally at C7-T1. Other:  None. CT CHEST FINDINGS Cardiovascular: The aortic root is suboptimally assessed given cardiac pulsation artifact. Atherosclerotic plaque within the normal caliber aorta. No acute luminal abnormality of the imaged aorta. No periaortic stranding or hemorrhage. Normal 3 vessel branching of the aortic arch. Shared origin of the brachiocephalic and left common carotid arteries.  Minimal plaque in the proximal great vessels. Cardiac size is top normal. No pericardial effusion. Calcification of mitral annulus and aortic leaflets. Three-vessel coronary artery atherosclerosis. Left chest wall battery pack with leads at the right atrium and apical septum. Central pulmonary arteries are normal caliber. No large central or lobar filling defects within limitations of this non tailored examination of the pulmonary arteries. No major venous abnormalities. Mediastinum/Nodes: No mediastinal fluid or gas. No acute abnormality of the trachea or esophagus. No worrisome mediastinal, hilar or axillary adenopathy.Few tiny subcentimeter hypoattenuating nodules in the bilateral thyroid glan, not likely clinically significant; no follow-up imaging recommended (ref: J Am Coll Radiol. 2015 Feb;12(2): 143-50). Lungs/Pleura: Hypoventilatory changes with more dense bandlike areas of opacity likely reflecting subsegmental atelectasis or scarring in the lung bases. Mild bronchitic features. No acute traumatic abnormality of the lung parenchyma. No pneumothorax or effusion. Musculoskeletal: Remote posttraumatic deformity of the right humerus. Superimposed chronic degenerative changes including a high-riding appearance  of the humeral head suggesting underlying rotator cuff insufficiency. More mild degenerative change of the left shoulder. No visible displaced rib or sternal fracture is seen no acute fracture or traumatic listhesis of the thoracic spine. No large body wall hematoma or suspicious soft tissue abnormalities of the chest. Mild body wall edema. CT ABDOMEN PELVIS FINDINGS Hepatobiliary: No direct hepatic injury or perihepatic hematoma. No worrisome focal liver lesions. Smooth liver surface contour. Normal hepatic attenuation. Prior cholecystectomy slight prominence of the biliary tree likely related to reservoir effect. Pancreas: No direct pancreatic injury or peripancreatic hematoma. No ductal disruption.  Partial fatty replacement of the pancreas. No pancreatic ductal dilatation or surrounding inflammatory changes. Spleen: No direct splenic injury or perisplenic hematoma. Normal in size. No concerning splenic lesions. Adrenals/Urinary Tract: No adrenal hemorrhage or suspicious adrenal lesions. No direct renal injury or perinephric hemorrhage. Kidneys enhance and excrete normally and symmetrically without extravasation of contrast on the excretory delayed phase imaging. Right extrarenal pelvis is unchanged from comparison. No concerning renal mass. No urolithiasis or hydronephrosis urinary bladder is free of traumatic findings or other acute abnormality. Stomach/Bowel: Distal esophagus, stomach and duodenal sweep are unremarkable. No small bowel wall thickening or dilatation. No evidence of obstruction. Appendix is not visualized. No focal inflammation the vicinity of the cecum to suggest an occult appendicitis. No colonic dilatation or wall thickening. No evidence of mesenteric hematoma or contusion. Vascular/Lymphatic: Atherosclerotic calcifications within the abdominal aorta and branch vessels. No aneurysm or ectasia. No concerning acute vascular injury or sites of active contrast extravasation. No enlarged abdominopelvic lymph nodes. Reproductive: Uterus is surgically absent. No concerning adnexal lesions. 4.2 cm fluid attenuation cystic lesion in the left adnexa (2/95), new from prior. Incompletely characterized on this exam. No concerning right adnexal lesion. Other: No abdominopelvic free air or fluid. No bowel containing hernia. Minimal increase in size of a broad-based fat containing umbilical hernia. No evidence of fat strangulation. Postsurgical changes noted in the anterior abdomen with few metallic surgical clips. Mild circumferential body wall edema most pronounced posteriorly. No traumatic abdominal wall dehiscence. Musculoskeletal: Redemonstration of a chronic L1 vertebral body fracture with  approximately 30% height loss and post vertebroplasty changes. Levocurvature of the thoracic and lumbar spine centered at this L1 level. Grade 1 anterolisthesis L4 on 5, L5 on S1 without associated spondylolysis. Multilevel degenerative changes are present in the imaged portions of the spine. Bones of the pelvis and bony sacrum are intact and congruent. Proximal femora intact and normally located. Postsurgical changes prior right femoral intramedullary nail placement. IMPRESSION: CT head 1. No acute intracranial abnormality. No significant calvarial fracture or scalp swelling 2. 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 3. Trace bilateral mastoid effusions. 4. Nasal septal perforation. 5. Chronic appearing nasal bone deformities. Correlate for point tenderness. CT cervical spine 1. No acute fracture or traumatic listhesis of the cervical spine. 2. Multilevel cervical spondylitic changes as detailed above. 3. 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 CT chest, abdomen and pelvis 1. No acute traumatic injury to the chest, abdomen, or pelvis. 2. Remote posttraumatic deformity of the right humerus. 3. Prior compression deformity of the L1 vertebral body with approximately 30% height loss and post vertebroplasty changes. 4. Cardiomegaly and coronary artery atherosclerosis. 5. Mild body wall edema.  Correlate with fluid status. 6. Fat containing umbilical hernia, minimally increased in size from prior without evidence of strangulation. 7. 4.2 cm fluid attenuation cystic lesion in the left adnexa, new from prior. Because this lesion is not adequately characterized,  prompt outpatient [place] is recommended for further evaluation. Note: This recommendation does not apply to premenarchal patients and to those with increased risk (genetic, family history, elevated tumor markers or other high-risk factors) of ovarian cancer. Reference: JACR 2020 Feb; 17(2):248-254 8. Aortic Atherosclerosis (ICD10-I70.0).  Electronically Signed   By: Lovena Le M.D.   On: 10/02/2020 04:11   CT Cervical Spine Wo Contrast  Result Date: 10/02/2020 CLINICAL DATA:  Fall from bed EXAM: CT HEAD WITHOUT CONTRAST CT CERVICAL SPINE WITHOUT CONTRAST CT CHEST, ABDOMEN AND PELVIS WITH CONTRAST TECHNIQUE: Contiguous axial images were obtained from the base of the skull through the vertex without intravenous contrast. Multidetector CT imaging of the cervical spine was performed without intravenous contrast. Multiplanar CT image reconstructions were also generated. Multidetector CT imaging of the chest, abdomen and pelvis was performed following the standard protocol during bolus administration of intravenous contrast. CONTRAST:  14mL OMNIPAQUE IOHEXOL 300 MG/ML  SOLN COMPARISON:  Same day radiographs, CT head 10/20/2018, MR thoracic and lumbar spine 01/29/2020, CT abdomen pelvis 05/10/2017 CT angio chest 02/24/2017 FINDINGS: CT HEAD FINDINGS Brain: Right cerebellar encephalomalacia subjacent to a suboccipital craniectomy site compatible prior mass resection. No evidence of acute infarction, hemorrhage, hydrocephalus, extra-axial collection, new visible mass lesion or mass effect. Symmetric prominence of the ventricles, cisterns and sulci compatible with parenchymal volume loss. Patchy areas of white matter hypoattenuation are most compatible with chronic microvascular angiopathy. Vascular: Atherosclerotic calcification of the carotid siphons and intradural vertebral arteries. No hyperdense vessel. Skull: No calvarial fracture or suspicious osseous lesion. No scalp swelling or hematoma. Postsurgical changes of prior right suboccipital craniectomy without acute complication or significant interval change. Sinuses/Orbits: Anterior nasal septal perforation. Minimal thickening in the ethmoids and maxillary sinuses. No layering air-fluid levels or pneumatized secretions. Small bilateral mastoid effusions. Middle ear cavities are clear. Other: None.  CT CERVICAL FINDINGS Alignment: Stabilization collar absent. Straightening of normal cervical lordosis. Mild leftward lateral flexion. Leftward cranial rotation as well. No evidence of traumatic listhesis. No abnormally widened, perched or jumped facets. Normal alignment of the craniocervical and atlantoaxial articulations. Degenerative anterolisthesis C4 on C5 and C7 on T1. No evidence of traumatic listhesis. No abnormally widened, perched or jumped facets. Normal alignment of the craniocervical and atlantoaxial articulations. Skull base and vertebrae: No acute skull base fracture. No vertebral body fracture or height loss. Normal bone mineralization. Bony ankylosis of the C5-6 vertebral bodies and left articular facets. Multilevel spondylitic changes as below. Additional arthrosis at the atlantodental and basion dens intervals. Soft tissues and spinal canal: No pre or paravertebral fluid or swelling. No visible canal hematoma. Disc levels: Multilevel intervertebral disc height loss with spondylitic endplate changes. Slightly more pronounced disc osteophyte complexes and posterior osseous ridging C4-5 and C5-6 respectively result in some mild canal stenosis. Multilevel uncinate spurring and facet hypertrophic changes result in mild-to-moderate multilevel neural foraminal narrowing most pronounced on the left at C3-4 and bilaterally at C7-T1. Other:  None. CT CHEST FINDINGS Cardiovascular: The aortic root is suboptimally assessed given cardiac pulsation artifact. Atherosclerotic plaque within the normal caliber aorta. No acute luminal abnormality of the imaged aorta. No periaortic stranding or hemorrhage. Normal 3 vessel branching of the aortic arch. Shared origin of the brachiocephalic and left common carotid arteries. Minimal plaque in the proximal great vessels. Cardiac size is top normal. No pericardial effusion. Calcification of mitral annulus and aortic leaflets. Three-vessel coronary artery atherosclerosis.  Left chest wall battery pack with leads at the right atrium and apical septum. Central pulmonary  arteries are normal caliber. No large central or lobar filling defects within limitations of this non tailored examination of the pulmonary arteries. No major venous abnormalities. Mediastinum/Nodes: No mediastinal fluid or gas. No acute abnormality of the trachea or esophagus. No worrisome mediastinal, hilar or axillary adenopathy.Few tiny subcentimeter hypoattenuating nodules in the bilateral thyroid glan, not likely clinically significant; no follow-up imaging recommended (ref: J Am Coll Radiol. 2015 Feb;12(2): 143-50). Lungs/Pleura: Hypoventilatory changes with more dense bandlike areas of opacity likely reflecting subsegmental atelectasis or scarring in the lung bases. Mild bronchitic features. No acute traumatic abnormality of the lung parenchyma. No pneumothorax or effusion. Musculoskeletal: Remote posttraumatic deformity of the right humerus. Superimposed chronic degenerative changes including a high-riding appearance of the humeral head suggesting underlying rotator cuff insufficiency. More mild degenerative change of the left shoulder. No visible displaced rib or sternal fracture is seen no acute fracture or traumatic listhesis of the thoracic spine. No large body wall hematoma or suspicious soft tissue abnormalities of the chest. Mild body wall edema. CT ABDOMEN PELVIS FINDINGS Hepatobiliary: No direct hepatic injury or perihepatic hematoma. No worrisome focal liver lesions. Smooth liver surface contour. Normal hepatic attenuation. Prior cholecystectomy slight prominence of the biliary tree likely related to reservoir effect. Pancreas: No direct pancreatic injury or peripancreatic hematoma. No ductal disruption. Partial fatty replacement of the pancreas. No pancreatic ductal dilatation or surrounding inflammatory changes. Spleen: No direct splenic injury or perisplenic hematoma. Normal in size. No concerning  splenic lesions. Adrenals/Urinary Tract: No adrenal hemorrhage or suspicious adrenal lesions. No direct renal injury or perinephric hemorrhage. Kidneys enhance and excrete normally and symmetrically without extravasation of contrast on the excretory delayed phase imaging. Right extrarenal pelvis is unchanged from comparison. No concerning renal mass. No urolithiasis or hydronephrosis urinary bladder is free of traumatic findings or other acute abnormality. Stomach/Bowel: Distal esophagus, stomach and duodenal sweep are unremarkable. No small bowel wall thickening or dilatation. No evidence of obstruction. Appendix is not visualized. No focal inflammation the vicinity of the cecum to suggest an occult appendicitis. No colonic dilatation or wall thickening. No evidence of mesenteric hematoma or contusion. Vascular/Lymphatic: Atherosclerotic calcifications within the abdominal aorta and branch vessels. No aneurysm or ectasia. No concerning acute vascular injury or sites of active contrast extravasation. No enlarged abdominopelvic lymph nodes. Reproductive: Uterus is surgically absent. No concerning adnexal lesions. 4.2 cm fluid attenuation cystic lesion in the left adnexa (2/95), new from prior. Incompletely characterized on this exam. No concerning right adnexal lesion. Other: No abdominopelvic free air or fluid. No bowel containing hernia. Minimal increase in size of a broad-based fat containing umbilical hernia. No evidence of fat strangulation. Postsurgical changes noted in the anterior abdomen with few metallic surgical clips. Mild circumferential body wall edema most pronounced posteriorly. No traumatic abdominal wall dehiscence. Musculoskeletal: Redemonstration of a chronic L1 vertebral body fracture with approximately 30% height loss and post vertebroplasty changes. Levocurvature of the thoracic and lumbar spine centered at this L1 level. Grade 1 anterolisthesis L4 on 5, L5 on S1 without associated  spondylolysis. Multilevel degenerative changes are present in the imaged portions of the spine. Bones of the pelvis and bony sacrum are intact and congruent. Proximal femora intact and normally located. Postsurgical changes prior right femoral intramedullary nail placement. IMPRESSION: CT head 1. No acute intracranial abnormality. No significant calvarial fracture or scalp swelling 2. 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 3. Trace bilateral mastoid effusions. 4. Nasal septal perforation. 5. Chronic appearing nasal bone deformities. Correlate for point tenderness.  CT cervical spine 1. No acute fracture or traumatic listhesis of the cervical spine. 2. Multilevel cervical spondylitic changes as detailed above. 3. 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 CT chest, abdomen and pelvis 1. No acute traumatic injury to the chest, abdomen, or pelvis. 2. Remote posttraumatic deformity of the right humerus. 3. Prior compression deformity of the L1 vertebral body with approximately 30% height loss and post vertebroplasty changes. 4. Cardiomegaly and coronary artery atherosclerosis. 5. Mild body wall edema.  Correlate with fluid status. 6. Fat containing umbilical hernia, minimally increased in size from prior without evidence of strangulation. 7. 4.2 cm fluid attenuation cystic lesion in the left adnexa, new from prior. Because this lesion is not adequately characterized, prompt outpatient [place] is recommended for further evaluation. Note: This recommendation does not apply to premenarchal patients and to those with increased risk (genetic, family history, elevated tumor markers or other high-risk factors) of ovarian cancer. Reference: JACR 2020 Feb; 17(2):248-254 8. Aortic Atherosclerosis (ICD10-I70.0). Electronically Signed   By: Lovena Le M.D.   On: 10/02/2020 04:11   CT CHEST ABDOMEN PELVIS W CONTRAST  Result Date: 10/02/2020 CLINICAL DATA:  Fall from bed EXAM: CT HEAD WITHOUT CONTRAST CT  CERVICAL SPINE WITHOUT CONTRAST CT CHEST, ABDOMEN AND PELVIS WITH CONTRAST TECHNIQUE: Contiguous axial images were obtained from the base of the skull through the vertex without intravenous contrast. Multidetector CT imaging of the cervical spine was performed without intravenous contrast. Multiplanar CT image reconstructions were also generated. Multidetector CT imaging of the chest, abdomen and pelvis was performed following the standard protocol during bolus administration of intravenous contrast. CONTRAST:  15mL OMNIPAQUE IOHEXOL 300 MG/ML  SOLN COMPARISON:  Same day radiographs, CT head 10/20/2018, MR thoracic and lumbar spine 01/29/2020, CT abdomen pelvis 05/10/2017 CT angio chest 02/24/2017 FINDINGS: CT HEAD FINDINGS Brain: Right cerebellar encephalomalacia subjacent to a suboccipital craniectomy site compatible prior mass resection. No evidence of acute infarction, hemorrhage, hydrocephalus, extra-axial collection, new visible mass lesion or mass effect. Symmetric prominence of the ventricles, cisterns and sulci compatible with parenchymal volume loss. Patchy areas of white matter hypoattenuation are most compatible with chronic microvascular angiopathy. Vascular: Atherosclerotic calcification of the carotid siphons and intradural vertebral arteries. No hyperdense vessel. Skull: No calvarial fracture or suspicious osseous lesion. No scalp swelling or hematoma. Postsurgical changes of prior right suboccipital craniectomy without acute complication or significant interval change. Sinuses/Orbits: Anterior nasal septal perforation. Minimal thickening in the ethmoids and maxillary sinuses. No layering air-fluid levels or pneumatized secretions. Small bilateral mastoid effusions. Middle ear cavities are clear. Other: None. CT CERVICAL FINDINGS Alignment: Stabilization collar absent. Straightening of normal cervical lordosis. Mild leftward lateral flexion. Leftward cranial rotation as well. No evidence of  traumatic listhesis. No abnormally widened, perched or jumped facets. Normal alignment of the craniocervical and atlantoaxial articulations. Degenerative anterolisthesis C4 on C5 and C7 on T1. No evidence of traumatic listhesis. No abnormally widened, perched or jumped facets. Normal alignment of the craniocervical and atlantoaxial articulations. Skull base and vertebrae: No acute skull base fracture. No vertebral body fracture or height loss. Normal bone mineralization. Bony ankylosis of the C5-6 vertebral bodies and left articular facets. Multilevel spondylitic changes as below. Additional arthrosis at the atlantodental and basion dens intervals. Soft tissues and spinal canal: No pre or paravertebral fluid or swelling. No visible canal hematoma. Disc levels: Multilevel intervertebral disc height loss with spondylitic endplate changes. Slightly more pronounced disc osteophyte complexes and posterior osseous ridging C4-5 and C5-6 respectively result in some mild canal  stenosis. Multilevel uncinate spurring and facet hypertrophic changes result in mild-to-moderate multilevel neural foraminal narrowing most pronounced on the left at C3-4 and bilaterally at C7-T1. Other:  None. CT CHEST FINDINGS Cardiovascular: The aortic root is suboptimally assessed given cardiac pulsation artifact. Atherosclerotic plaque within the normal caliber aorta. No acute luminal abnormality of the imaged aorta. No periaortic stranding or hemorrhage. Normal 3 vessel branching of the aortic arch. Shared origin of the brachiocephalic and left common carotid arteries. Minimal plaque in the proximal great vessels. Cardiac size is top normal. No pericardial effusion. Calcification of mitral annulus and aortic leaflets. Three-vessel coronary artery atherosclerosis. Left chest wall battery pack with leads at the right atrium and apical septum. Central pulmonary arteries are normal caliber. No large central or lobar filling defects within limitations  of this non tailored examination of the pulmonary arteries. No major venous abnormalities. Mediastinum/Nodes: No mediastinal fluid or gas. No acute abnormality of the trachea or esophagus. No worrisome mediastinal, hilar or axillary adenopathy.Few tiny subcentimeter hypoattenuating nodules in the bilateral thyroid glan, not likely clinically significant; no follow-up imaging recommended (ref: J Am Coll Radiol. 2015 Feb;12(2): 143-50). Lungs/Pleura: Hypoventilatory changes with more dense bandlike areas of opacity likely reflecting subsegmental atelectasis or scarring in the lung bases. Mild bronchitic features. No acute traumatic abnormality of the lung parenchyma. No pneumothorax or effusion. Musculoskeletal: Remote posttraumatic deformity of the right humerus. Superimposed chronic degenerative changes including a high-riding appearance of the humeral head suggesting underlying rotator cuff insufficiency. More mild degenerative change of the left shoulder. No visible displaced rib or sternal fracture is seen no acute fracture or traumatic listhesis of the thoracic spine. No large body wall hematoma or suspicious soft tissue abnormalities of the chest. Mild body wall edema. CT ABDOMEN PELVIS FINDINGS Hepatobiliary: No direct hepatic injury or perihepatic hematoma. No worrisome focal liver lesions. Smooth liver surface contour. Normal hepatic attenuation. Prior cholecystectomy slight prominence of the biliary tree likely related to reservoir effect. Pancreas: No direct pancreatic injury or peripancreatic hematoma. No ductal disruption. Partial fatty replacement of the pancreas. No pancreatic ductal dilatation or surrounding inflammatory changes. Spleen: No direct splenic injury or perisplenic hematoma. Normal in size. No concerning splenic lesions. Adrenals/Urinary Tract: No adrenal hemorrhage or suspicious adrenal lesions. No direct renal injury or perinephric hemorrhage. Kidneys enhance and excrete normally and  symmetrically without extravasation of contrast on the excretory delayed phase imaging. Right extrarenal pelvis is unchanged from comparison. No concerning renal mass. No urolithiasis or hydronephrosis urinary bladder is free of traumatic findings or other acute abnormality. Stomach/Bowel: Distal esophagus, stomach and duodenal sweep are unremarkable. No small bowel wall thickening or dilatation. No evidence of obstruction. Appendix is not visualized. No focal inflammation the vicinity of the cecum to suggest an occult appendicitis. No colonic dilatation or wall thickening. No evidence of mesenteric hematoma or contusion. Vascular/Lymphatic: Atherosclerotic calcifications within the abdominal aorta and branch vessels. No aneurysm or ectasia. No concerning acute vascular injury or sites of active contrast extravasation. No enlarged abdominopelvic lymph nodes. Reproductive: Uterus is surgically absent. No concerning adnexal lesions. 4.2 cm fluid attenuation cystic lesion in the left adnexa (2/95), new from prior. Incompletely characterized on this exam. No concerning right adnexal lesion. Other: No abdominopelvic free air or fluid. No bowel containing hernia. Minimal increase in size of a broad-based fat containing umbilical hernia. No evidence of fat strangulation. Postsurgical changes noted in the anterior abdomen with few metallic surgical clips. Mild circumferential body wall edema most pronounced posteriorly. No traumatic abdominal  wall dehiscence. Musculoskeletal: Redemonstration of a chronic L1 vertebral body fracture with approximately 30% height loss and post vertebroplasty changes. Levocurvature of the thoracic and lumbar spine centered at this L1 level. Grade 1 anterolisthesis L4 on 5, L5 on S1 without associated spondylolysis. Multilevel degenerative changes are present in the imaged portions of the spine. Bones of the pelvis and bony sacrum are intact and congruent. Proximal femora intact and normally  located. Postsurgical changes prior right femoral intramedullary nail placement. IMPRESSION: CT head 1. No acute intracranial abnormality. No significant calvarial fracture or scalp swelling 2. 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 3. Trace bilateral mastoid effusions. 4. Nasal septal perforation. 5. Chronic appearing nasal bone deformities. Correlate for point tenderness. CT cervical spine 1. No acute fracture or traumatic listhesis of the cervical spine. 2. Multilevel cervical spondylitic changes as detailed above. 3. 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 CT chest, abdomen and pelvis 1. No acute traumatic injury to the chest, abdomen, or pelvis. 2. Remote posttraumatic deformity of the right humerus. 3. Prior compression deformity of the L1 vertebral body with approximately 30% height loss and post vertebroplasty changes. 4. Cardiomegaly and coronary artery atherosclerosis. 5. Mild body wall edema.  Correlate with fluid status. 6. Fat containing umbilical hernia, minimally increased in size from prior without evidence of strangulation. 7. 4.2 cm fluid attenuation cystic lesion in the left adnexa, new from prior. Because this lesion is not adequately characterized, prompt outpatient [place] is recommended for further evaluation. Note: This recommendation does not apply to premenarchal patients and to those with increased risk (genetic, family history, elevated tumor markers or other high-risk factors) of ovarian cancer. Reference: JACR 2020 Feb; 17(2):248-254 8. Aortic Atherosclerosis (ICD10-I70.0). Electronically Signed   By: Lovena Le M.D.   On: 10/02/2020 04:11   DG Hip Unilat W or Wo Pelvis 2-3 Views Right  Result Date: 10/02/2020 CLINICAL DATA:  Status post fall. EXAM: DG HIP (WITH OR WITHOUT PELVIS) 2-3V RIGHT COMPARISON:  None. FINDINGS: There is no evidence of acute hip fracture or dislocation. A radiopaque intramedullary rod is seen within the proximal right femur. Radiopaque  surgical clips are seen overlying the lower pelvis. There is no evidence of arthropathy or other focal bone abnormality. Marked severity vascular calcification is seen. IMPRESSION: No acute osseous abnormality. Electronically Signed   By: Virgina Norfolk M.D.   On: 10/02/2020 03:19    ____________________________________________   PROCEDURES and INTERVENTIONS  Procedure(s) performed (including Critical Care):  .1-3 Lead EKG Interpretation Performed by: Vladimir Crofts, MD Authorized by: Vladimir Crofts, MD     Interpretation: normal     ECG rate:  80   ECG rate assessment: normal     Rhythm: sinus rhythm     Ectopy: none     Conduction: normal      Medications  sodium chloride 0.9 % bolus 500 mL (0 mLs Intravenous Stopped 10/02/20 0343)  acetaminophen (TYLENOL) tablet 1,000 mg (1,000 mg Oral Given 10/02/20 0233)  iohexol (OMNIPAQUE) 300 MG/ML solution 100 mL (100 mLs Intravenous Contrast Given 10/02/20 0305)    ____________________________________________   MDM / ED COURSE   75 year old woman presents from SNF after accidentally rolling out of bed, without evidence of significant acute traumatic pathology, and amenable to return to facility.  Presents with soft pressures, resolved with 500 cc fluid bolus, and otherwise normal vitals on room air.  Exam with multiple areas of tenderness concerning for possible closed injury to her right shoulder, right hip and chest wall.  She has no discrete lacerations or signs of trauma beyond her multiple areas of tenderness.  No neurovascular deficits or distress.  Basic labs are unremarkable.  EKG is nonischemic without evidence of concerning cardiac intervals for possible cardiac syncope.  Due to her low blood pressures, diffuse pain and advanced age, pan CT imaging obtained and demonstrates no evidence of acute ICH, cervical fracture, rib fractures or intra-abdominal injury.  Furthermore, plain films of the pelvis, right hip and right shoulder  without evidence of fracture or dislocation.  Patient's pain is well controlled with Tylenol and I see no evidence of pathology to preclude outpatient management.  Return precautions for the ED were discussed prior to return to facility via EMS.   Clinical Course as of 10/02/20 0525  Wed Oct 02, 2020  0223 Soft pressures noted. RN starting IVF and I place imaging orders.  [DS]  0448 Reassessed.  Blood pressures have improved and maintained normotensive readings for the past 2 hours after fluids were started.  Imaging without acute traumatic pathology noted.  I reassessed the patient and she is resting comfortably.  Reports improving pain after Tylenol. [DS]    Clinical Course User Index [DS] Vladimir Crofts, MD    ____________________________________________   FINAL CLINICAL IMPRESSION(S) / ED DIAGNOSES  Final diagnoses:  Fall  Fall from bed, initial encounter     ED Discharge Orders    None       Ary Rudnick Tamala Julian   Note:  This document was prepared using Dragon voice recognition software and may include unintentional dictation errors.   Vladimir Crofts, MD 10/02/20 [phone]

## 2020-10-02 NOTE — ED Notes (Signed)
Pt transport has arrived to transport pt back to [facility] -- no acute changes; pt continues to rest with eyes closed; RR even and unlabored on RA -- arousable to voice but drowsy

## 2020-10-02 NOTE — ED Notes (Addendum)
[facility] notified workup unremarkable and [place] [facility] arranging transport back to facility - pt now resting with eyes closed; RR even and unlabored on 3L O2 via Gas; no acute distress noted.

## 2020-10-02 NOTE — Progress Notes (Signed)
[facility] Consult Note Telephone: [phone]  Fax: [phone]  [name] DOB: [birth year] MRN: [id number]  PRIMARY CARE PROVIDER:  [place] PARTY:Self  1.RSW:NIOEVOJ goals consists DNR, do not intubate. Wishes are for more conservative care to include antibiotics, IV fluids, blood transfusions, diagnostic testing, lab testing. Wishes are to minimize hospitalizations if necessary.  2.Dyspneic secondary to congestive heart failure remain stable at present time. Continue daily weights and outpatient Cardiology appointments  3.Palliative care encounter; Palliative medicine team will continue to support patient, patient's family, and medical team. Visit consisted of counseling and education dealing with the complex and emotionally intense issues of symptom management and palliative  5. F/u 2 months for ongoing monitoring disease progression, chronic nausea, debility.  I spent 35 minutes providing this consultation,  from starting at 11:30am More than 50% of the time in this consultation was spent coordinating communication.   HISTORY OF PRESENT ILLNESS:  Meghan [name] is a 75 y.o. year old female with multiple medical problems including COPD, congestive heart failure with ef 20%, asthma, collagen vascular disease, coronary artery disease, heart murmur, benign brain tumor, hypertension, gerd, hypercholesterolemia, history of headache, diabetes, gerd, arthritis, osteoporosis, lumbar degenerative disc disease, anxiety, PTSD, depression, kyphoplasty, right hand reconstruction 2011, cholecystectomy, cervical fusion, abdominal hysterectomy. Meghan [name] continues to reside in [place] has [facility]. Meghan [name] takes steps and is ambulatory in her room. Meghan [name]  does need assistance with adl's. Meghan [name]  does feed herself, current weight 157.8 lb.  BMI 31.9. Meghan [name]  has had a 6.2 lb weight loss in  3 months.  Meghan [name]  is on a regular diet regular texture regular liquid consistency. Meghan [name] had a fall 2/ 23 / 2022 had a fall and has returned from hospital evaluation. Meghan [name]  is followed by Podiatry at the facility. Meghan [name] is also followed by Psychotherapy in Psychiatry. Last Psychiatry visit 1 / 6 / 2022 for bipolar disorder prescribed Lexapro and Lamictal for mood, Remeron for appetite. Anxiety due to General medical condition with history of PTSD due to past trauma prescribed Klonopin and Buspar which has been helpful. No new changes at this visit. Last [facility] note 12 / 7 / 2021 for follow-up anemia. She is taking oral supplement with hemoglobin stable. Medical goals for [facility] DNR, do not in today, do not hospitalized, no surgical intervention, no feeding tube but wishes are for antibiotics, blood transfusion, lab test, IV hydration, diagnostic testing. Staff endorses since Meghan [name] has returned from ER visit Meghan [name] has continued to be nauseous so they were getting ready to give her antiemetic. At present Meghan [name] is lying in bed playing a game on her tablet. Meghan [name] appears to be located but comfortable. No visitors present. I visit and observed Meghan [name]. We talked about purpose of Palliative care visit. Meghan [name] in agreement. We talked about her recent visit to the emergency department after the call. Meghan [name] endorses she has a stomach ache with generalized pain with  nauseous chronically. We talked about antiemetic the nurse was getting ready to bring her. We talked about the food that she seems that she has to return which has been very little. Meghan [name] endorses she had a little bit to drink. Meghan [name]  moved to a new room with a new roommate. We talked about residing in Skilled facility, challenges  with coping strategies. We talked about chronic nausea at night. We talked about intermittent diarrhea. We talked about  medical goals of care. We talked about chronic disease progression. We talked about the work Meghan [name] been doing with this psychotherapist. Praised Meghan [name] for working with a psychotherapist in psychiatry. We talked about role of Palliative care and plan of care. No new changes that present time. Will continue current goals of care. I updated nursing staff. Will follow up in two months for ongoing chronic disease management, progression monitoring, complex medical decision-making. I updated nursing staff  Palliative Care was asked to help to continue to address goals of care.   CODE STATUS: DNR  PPS: 50% HOSPICE ELIGIBILITY/DIAGNOSIS: TBD  PAST MEDICAL HISTORY:  Past Medical History:  Diagnosis Date  . Anginal pain ([place])   . Anxiety   . Arthritis    RA  . Asthma   . Brain tumor (benign) ([place])   . CHF (congestive heart failure) ([place])   . Collagen vascular disease ([place])   . Complete heart block ([place])   . COPD (chronic obstructive pulmonary disease) ([place])   . Coronary artery disease   . DDD (degenerative disc disease)   . Depression   . Diabetes mellitus   . GERD (gastroesophageal reflux disease)   . Headache   . Heart murmur   . Hypercholesteremia   . Hypertension   . Lumbar degenerative disc disease   . Migraines   . Obesity   . Osteopenia   . Osteoporosis   . Pacemaker- St Jude   . Pneumonia   . PTSD (post-traumatic stress disorder)     SOCIAL HX:  Social History   Tobacco Use  . Smoking status: Never Smoker  . Smokeless tobacco: Never Used  Substance Use Topics  . Alcohol use: No    Alcohol/week: 0.0 standard drinks    ALLERGIES:  Allergies  Allergen Reactions  . Amoxicillin Itching and Other (See Comments)    Has patient had a PCN reaction causing immediate rash, facial/tongue/throat swelling, SOB or lightheadedness with hypotension: Unknown Has patient had a PCN reaction causing severe rash involving mucus membranes or skin necrosis: Unknown Has patient  had a PCN reaction that required hospitalization: Unknown Has patient had a PCN reaction occurring within the last 10 years: Unknown If all of the above answers are "NO", then may proceed with Cephalosporin use.   . Gabapentin Other (See Comments)    Pt states that it causes her BP to drop.   . Iodine Other (See Comments)    Reaction:  Syncopy  . Naproxen Hives  . Nsaids Other (See Comments)    Reaction:  Unknown   . Tolmetin Other (See Comments)    Reaction:  Unknown      PHYSICAL EXAM:   General: NAD, deconditioned, chronically ill pleasant pale female Cardiovascular: regular rate and rhythm Pulmonary: clear ant fields Abdomen: round, soft, nontender, + bowel sounds Neurological: ambulates steps  Ayiana Winslett Ihor Gully, NP

## 2020-10-02 NOTE — ED Notes (Signed)
ED Provider at bedside. 

## 2020-10-02 NOTE — ED Notes (Signed)
Pt now on RA (baseline) -- O2 sats remain at 100% at this time.

## 2020-10-02 NOTE — ED Notes (Signed)
Pt noted to have 2 linear abrasions to R parietal scalp with scalp indention at abrasion sites - pt reports tenderness to R scalp and TTP to R shoulder.  Pt also reports RLQ abdomen pain she describes as acute since fall; states abd pain described as sharp.  Rash to RLQ and R groin regions noted. No obvious deformities or lacerations noted.  VSS. Pt now awaits ED provider eval.  Will monitor for acute changes and maintain plan of care

## 2020-10-02 NOTE — ED Notes (Signed)
Patient transported to X-ray 

## 2020-10-02 NOTE — ED Notes (Signed)
Late entry -- Pt has returned from xray/CT via stretcher; resting with eyes closed but easily aroused with verbal stimulation -- continues to report pain -- states tylenol did not relieve pain and pain now 10/10

## 2020-10-02 NOTE — ED Triage Notes (Signed)
Pt arrives from [facility] via [facility] - s/p fall out of bed; per facility pt rolled out of bed while sleeping-- pt did hit head but unk if LOC -- pt now c/o R parietal region pain, R shoulder pain and R hip pain.  Facility states mild confusion however ems reports GCS 15 enroute and pt answering questions appropriately upon arrival.  Pt did arrive on 3L O2 via [place] - pt not typically O2 dependent however O2 sats on RA 89% per ems when they arrived on scene -- improvement to 96% when pt placed on 3L O2 via [place]

## 2020-10-02 NOTE — Discharge Instructions (Signed)
Use Tylenol for pain and fevers.  Up to 1000 mg per dose, up to 4 times per day.  Do not take more than 4000 mg of Tylenol/acetaminophen within 24 hours..  We did CT scans of her head, neck and body without evidence of injuries or trauma.  X-rays of her right shoulder and right hip without fracture.

## 2020-10-02 NOTE — ED Notes (Signed)
Pt remains stable on RA-- O2 sats 93-95% on RA at this time.

## 2020-10-16 ENCOUNTER — Emergency Department
Admission: EM | Admit: 2020-10-16 | Discharge: 2020-10-16 | Disposition: A | Discharge status: Home / Self Care | Payer: Medicare Other | Attending: Emergency Medicine | Admitting: Emergency Medicine

## 2020-10-16 ENCOUNTER — Emergency Department: Payer: Medicare Other

## 2020-10-16 ENCOUNTER — Other Ambulatory Visit: Payer: Self-pay

## 2020-10-16 DIAGNOSIS — J449 Chronic obstructive pulmonary disease, unspecified: Secondary | ICD-10-CM | POA: Diagnosis not present

## 2020-10-16 DIAGNOSIS — Z950 Presence of cardiac pacemaker: Secondary | ICD-10-CM | POA: Insufficient documentation

## 2020-10-16 DIAGNOSIS — R519 Headache, unspecified: Secondary | ICD-10-CM | POA: Insufficient documentation

## 2020-10-16 DIAGNOSIS — I2510 Atherosclerotic heart disease of native coronary artery without angina pectoris: Secondary | ICD-10-CM | POA: Insufficient documentation

## 2020-10-16 DIAGNOSIS — M545 Low back pain, unspecified: Secondary | ICD-10-CM | POA: Insufficient documentation

## 2020-10-16 DIAGNOSIS — I5023 Acute on chronic systolic (congestive) heart failure: Secondary | ICD-10-CM | POA: Diagnosis not present

## 2020-10-16 DIAGNOSIS — M542 Cervicalgia: Secondary | ICD-10-CM | POA: Insufficient documentation

## 2020-10-16 DIAGNOSIS — Z96653 Presence of artificial knee joint, bilateral: Secondary | ICD-10-CM | POA: Diagnosis not present

## 2020-10-16 DIAGNOSIS — J45901 Unspecified asthma with (acute) exacerbation: Secondary | ICD-10-CM | POA: Insufficient documentation

## 2020-10-16 DIAGNOSIS — Z7951 Long term (current) use of inhaled steroids: Secondary | ICD-10-CM | POA: Diagnosis not present

## 2020-10-16 DIAGNOSIS — M546 Pain in thoracic spine: Secondary | ICD-10-CM | POA: Insufficient documentation

## 2020-10-16 DIAGNOSIS — E119 Type 2 diabetes mellitus without complications: Secondary | ICD-10-CM | POA: Diagnosis not present

## 2020-10-16 DIAGNOSIS — W1839XA Other fall on same level, initial encounter: Secondary | ICD-10-CM | POA: Insufficient documentation

## 2020-10-16 DIAGNOSIS — I110 Hypertensive heart disease with heart failure: Secondary | ICD-10-CM | POA: Diagnosis not present

## 2020-10-16 DIAGNOSIS — W19XXXA Unspecified fall, initial encounter: Secondary | ICD-10-CM

## 2020-10-16 MED ORDER — ONDANSETRON HCL 4 MG/2ML IJ SOLN: 4.0000 mg | Freq: Once | INTRAMUSCULAR | Status: DC

## 2020-10-16 NOTE — ED Triage Notes (Signed)
Pt presents to the [facility] via EMS from [facility] with c/o lower back pain that began yesterday after falling. Pt states that she attempted to bend over and pick up something on the ground when she fell. Pt reports several falls over the past couple weeks. Pt currently A&O and answering questions appropriately.

## 2020-10-16 NOTE — ED Provider Notes (Addendum)
[facility] Emergency Department Provider Note   ____________________________________________    I have reviewed the triage vital signs and the nursing notes.   HISTORY  Chief Complaint Fall and Back Pain     HPI Meghan [name] is a 75 y.o. female who arrives via EMS from nursing facility after fall.  Apparently the patient had a fall yesterday she lost her balance while bending over.  Reportedly she is not on blood thinners.  She think she may have hit her head yesterday as well.  She complains of mid back pain and some neck discomfort.  Past Medical History:  Diagnosis Date  . Anginal pain ([place])   . Anxiety   . Arthritis    RA  . Asthma   . Brain tumor (benign) ([place])   . CHF (congestive heart failure) ([place])   . Collagen vascular disease ([place])   . Complete heart block ([place])   . COPD (chronic obstructive pulmonary disease) ([place])   . Coronary artery disease   . DDD (degenerative disc disease)   . Depression   . Diabetes mellitus   . GERD (gastroesophageal reflux disease)   . Headache   . Heart murmur   . Hypercholesteremia   . Hypertension   . Lumbar degenerative disc disease   . Migraines   . Obesity   . Osteopenia   . Osteoporosis   . Pacemaker- St Jude   . Pneumonia   . PTSD (post-traumatic stress disorder)     Patient Active Problem List   Diagnosis Date Noted  . Dyspnea 11/08/2018  . Complete heart block ([place]) 10/21/2018  . Closed fracture of distal end of right femur ([place]), periprosthetic 10/21/2018  . Palliative care encounter 09/01/2018  . Weakness generalized 09/01/2018  . Shortness of breath 09/01/2018  . Syncope 05/05/2017  . Acute on chronic systolic CHF (congestive heart failure) ([place]) 04/16/2017  . Acute lower UTI 04/16/2017  . Acute respiratory failure ([place]) 04/16/2017  . Pressure injury of skin 03/22/2017  . Acute on chronic respiratory failure with hypoxia ([place]) 03/20/2017  . Acute respiratory failure with  hypoxia ([place]) 03/20/2017  . Near syncope 02/27/2017  . Dehydration 02/27/2017  . Colitis 02/22/2017  . Seizure ([place]) 01/03/2016  . Chronic tension-type headache, intractable 12/04/2015  . 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 Degeneration of intervertebral disc of lumbar region 07/15/2015  . Seropositive rheumatoid arthritis ([place]) 07/15/2015  . Asthma with acute exacerbation 03/10/2015  . Hypokalemia 03/01/2015  . Hyponatremia 03/01/2015  . DDD (degenerative disc disease), lumbar 01/31/2015  . Arthritis, degenerative 01/31/2015  . 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 HTN (hypertension) 12/24/2014  . Sepsis ([place]) 12/24/2014  . Left knee pain 12/24/2014  . GERD (gastroesophageal reflux disease) 12/24/2014  . COPD (chronic obstructive pulmonary disease) ([place]) 12/24/2014  . Depression 12/24/2014  . Anxiety 12/24/2014  . Severe bipolar disorder with psychotic features, mood-congruent ([place]) 11/16/2014  . Neurosis, posttraumatic 11/16/2014  . H/O gastric ulcer 11/16/2014  . Barton's fracture of distal radius, closed 09/19/2014  . Neuritis or radiculitis due to rupture of lumbar intervertebral disc 05/11/2014  . Cervico-occipital neuralgia 03/26/2014  . Difficulty in walking 03/26/2014  . Difficulty in walking, not elsewhere classified 03/26/2014  . Cervical spine syndrome 01/26/2014  . Cephalalgia 01/09/2014  . Disordered sleep 01/09/2014  . BP (high blood pressure) 10/19/2013  . Adiposity 10/19/2013  . Cardiac murmur 10/19/2013  . Breath shortness 10/19/2013  . Chronic obstructive pulmonary disease ([place]) 10/19/2013  .  Diabetes mellitus ([place]) 10/19/2013    Past Surgical History:  Procedure Laterality Date  . ABDOMINAL HYSTERECTOMY    . CERVICAL FUSION    . CHOLECYSTECTOMY    . COLONOSCOPY WITH PROPOFOL N/A 09/20/2015   Procedure: COLONOSCOPY WITH PROPOFOL;  Surgeon: Josefine Class, MD;  Location: [facility] ENDOSCOPY;  Service: Endoscopy;  Laterality:  N/A;  . COLONOSCOPY WITH PROPOFOL N/A 01/27/2016   Procedure: COLONOSCOPY WITH PROPOFOL;  Surgeon: Manya Silvas, MD;  Location: [facility] ENDOSCOPY;  Service: Endoscopy;  Laterality: N/A;  . FINGER ARTHROPLASTY  02/23/2012   Procedure: FINGER ARTHROPLASTY;  Surgeon: Cammie Sickle., MD;  Location: [facility];  Service: Orthopedics;  Laterality: Left;  Extensor carpi radialis longus to Extensor carpi ulnaris transfer, left Metaphalangeal reconstructions of index and long fingers,  . FOOT ARTHROPLASTY     toes x2 rt foot  . HAND RECONSTRUCTION  2011   right-multiple finger joint reconst  . JOINT REPLACEMENT     bilat knee replacements  . KYPHOPLASTY N/A 06/14/2017   Procedure: KYPHOPLASTY L1;  Surgeon: Hessie Knows, MD;  Location: [facility] ORS;  Service: Orthopedics;  Laterality: N/A;  . LOOP RECORDER INSERTION N/A 06/23/2017   Procedure: LOOP RECORDER INSERTION;  Surgeon: Isaias Cowman, MD;  Location: [facility] CV LAB;  Service: Cardiovascular;  Laterality: N/A;  . LOOP RECORDER REMOVAL N/A 10/21/2018   Procedure: LOOP RECORDER REMOVAL;  Surgeon: Deboraha Sprang, MD;  Location: [place] CV LAB;  Service: Cardiovascular;  Laterality: N/A;  . ORIF FEMUR FRACTURE Right 10/22/2018   Procedure: OPEN REDUCTION INTERNAL FIXATION (ORIF) DISTAL FEMUR FRACTURE;  Surgeon: Marchia Bond, MD;  Location: [facility];  Service: Orthopedics;  Laterality: Right;  . PACEMAKER IMPLANT N/A 10/21/2018   Procedure: PACEMAKER IMPLANT;  Surgeon: Deboraha Sprang, MD;  Location: [place] CV LAB;  Service: Cardiovascular;  Laterality: N/A;    Prior to Admission medications   Medication Sig Start Date End Date Taking? Authorizing Provider  albuterol (VENTOLIN HFA) 108 (90 Base) MCG/ACT inhaler Inhale 1-2 puffs into the lungs every 6 (six) hours as needed for wheezing or shortness of breath.    [provider]  busPIRone (BUSPAR) 7.5 MG tablet Take 1 tablet by mouth 2 (two) times daily.     [provider]  Calcium Carb-Cholecalciferol (CALCIUM 500/D) 500-400 MG-UNIT CHEW Chew by mouth daily.    [provider]  Cholecalciferol 1.25 MG (50000 UT) TABS Take by mouth once a week.    [provider]  clonazePAM (KLONOPIN) 0.5 MG tablet Take 0.125 mg by mouth 2 (two) times daily.    [provider]  cyanocobalamin 500 MCG tablet Take 500 mcg by mouth daily.    [provider]  diclofenac sodium (VOLTAREN) 1 % GEL Apply 2 g topically 3 (three) times daily. Apply to the fingers    [provider]  escitalopram (LEXAPRO) 10 MG tablet Take 1 tablet (10 mg total) by mouth every morning. Patient taking differently: Take 20 mg by mouth every morning.  09/11/16   Rainey Pines, MD  fluticasone (VERAMYST) 27.5 MCG/SPRAY nasal spray Place 2 sprays into the nose 2 (two) times daily.    [provider]  folic acid (FOLVITE) 1 MG tablet Take 1 mg by mouth daily.    [provider]  furosemide (LASIX) 40 MG tablet Take 40 mg by mouth 2 (two) times daily.    [provider]  Ipratropium-Albuterol (COMBIVENT RESPIMAT) 20-100 MCG/ACT AERS respimat Inhale 1  puff into the lungs every 6 (six) hours.    [provider]  lamoTRIgine (LAMICTAL) 100 MG tablet Take 1 tablet (100 mg total) by mouth daily. 06/02/16   Rainey Pines, MD  loperamide (IMODIUM A-D) 2 MG tablet Take 1 tablet (2 mg total) by mouth 4 (four) times daily as needed for diarrhea or loose stools. Patient taking differently: Take 2 mg by mouth as needed for diarrhea or loose stools.  05/10/17   Eula Listen, MD  loratadine (CLARITIN) 10 MG tablet Take 10 mg by mouth daily.    [provider]  methotrexate (RHEUMATREX) 7.5 MG tablet Take 7.5 mg by mouth once a week. Caution" Chemotherapy. Protect from light.    [provider]  mirtazapine (REMERON) 7.5 MG tablet Take 7.5 mg by mouth at bedtime.     [provider]  omeprazole  (PRILOSEC) 20 MG capsule Take 20 mg by mouth daily.     [provider]  oxyCODONE-acetaminophen (PERCOCET/ROXICET) 5-325 MG tablet Take 1 tablet by mouth every 4 (four) hours as needed for moderate pain or severe pain. 10/24/18   Charlynne Cousins, MD  oxyCODONE-acetaminophen (PERCOCET/ROXICET) 5-325 MG tablet Take 1 tablet by mouth 2 (two) times a day. scheduled    [provider]  Potassium Chloride ER 20 MEQ TBCR Take 20 mEq by mouth 2 (two) times daily. 05/07/17   Vaughan Basta, MD  pramipexole (MIRAPEX) 0.25 MG tablet Take 0.25 mg by mouth daily.     [provider]  saxagliptin HCl (ONGLYZA) 5 MG TABS tablet Take 5 mg by mouth at bedtime.    [provider]  Tofacitinib Citrate 5 MG TABS Take 5 mg by mouth 2 (two) times daily after a meal.     [provider]  topiramate (TOPAMAX) 25 MG tablet Take 25 mg by mouth at bedtime.     [provider]     Allergies Amoxicillin, Gabapentin, Iodine, Naproxen, Nsaids, and Tolmetin  Family History  Problem Relation Age of Onset  . Depression Sister   . Migraines Sister   . Hypertension Mother   . Arthritis/Rheumatoid Mother   . Heart attack Father   . Hypertension Father   . CAD Other   . Hypertension Other   . Diabetes Mellitus II Other   . Arthritis Other     Social History Social History   Tobacco Use  . Smoking status: Never Smoker  . Smokeless tobacco: Never Used  Vaping Use  . Vaping Use: Never used  Substance Use Topics  . Alcohol use: No    Alcohol/week: 0.0 standard drinks  . Drug use: No    Review of Systems  Constitutional: No dizziness Eyes: No visual changes.  ENT: No nasal injury Cardiovascular: Denies chest wall pain Respiratory: Denies shortness of breath. Gastrointestinal: No abdominal pain.  No nausea, no vomiting.   Genitourinary: Negative for dysuria. Musculoskeletal: As above Skin: Negative for rash. Neurological: Negative for  headaches    ____________________________________________   PHYSICAL EXAM:  VITAL SIGNS: ED Triage Vitals  Enc Vitals Group     BP 10/16/20 1319 126/71     Pulse Rate 10/16/20 1319 87     Resp 10/16/20 1319 18     Temp 10/16/20 1319 98.1 F (36.7 C)     Temp Source 10/16/20 1319 Oral     SpO2 10/16/20 1319 94 %     Weight 10/16/20 1320 69.4 kg (153 lb)     Height 10/16/20 1320 1.422  m (4\' 8" )     Head Circumference --      Peak Flow --      Pain Score 10/16/20 1319 6     Pain Loc --      Pain Edu? --      Excl. in [place]? --     Constitutional: Alert and oriented. No acute distress.   Head: Atraumatic. Nose: No swelling or epistaxis Mouth/Throat: Mucous membranes are moist.   Neck: No pain with axial load, mild vertebral tenderness palpation Cardiovascular: Normal rate, regular rhythm. Grossly normal heart sounds.  Good peripheral circulation. Respiratory: Normal respiratory effort.  No retractions. Lungs CTAB. Gastrointestinal: Soft and nontender. No distention.  No CVA tenderness.  Musculoskeletal: No pain with axial load on both lower extremities, full range of motion of the upper extremities.  Vertebral tenderness noted around T12, L1, mild.  Normal strength in the lower extremities Neurologic:  Normal speech and language. No gross focal neurologic deficits are appreciated.  Skin:  Skin is warm, dry and intact. No rash noted. Psychiatric: Mood and affect are normal. Speech and behavior are normal.  ____________________________________________   LABS (all labs ordered are listed, but only abnormal results are displayed)  Labs Reviewed - No data to display ____________________________________________  EKG  None ____________________________________________  RADIOLOGY  CT head, cervical spine X-ray thoracic spine X-ray lumbar spine X-ray pelvis reviewed by me ____________________________________________   PROCEDURES  Procedure(s) performed:  No  Procedures   Critical Care performed: No ____________________________________________   INITIAL IMPRESSION / ASSESSMENT AND PLAN / ED COURSE  Pertinent labs & imaging results that were available during my care of the patient were reviewed by me and considered in my medical decision making (see chart for details).  Patient presents for mechanical fall, this occurred yesterday.  She continues to have back pain.  Also describes some neck and head pain.  We will obtain imaging of the head C-spine thoracic spine lumbar spine and pelvis as the patient is a poor historian  No blood thinners [facility]  CT head cervical spine are reassuring  X-ray lumbar spine pelvis and thoracic spine without acute injury  Patient appropriate for discharge with outpatient follow-up    ____________________________________________   FINAL CLINICAL IMPRESSION(S) / ED DIAGNOSES  Final diagnoses:  Fall, initial encounter  Acute midline thoracic back pain  Acute midline low back pain without sciatica        Note:  This document was prepared using Dragon voice recognition software and may include unintentional dictation errors.   Lavonia Drafts, MD 10/16/20 1457    Lavonia Drafts, MD 10/16/20 [phone]

## 2020-10-21 ENCOUNTER — Ambulatory Visit: Payer: Medicare Other

## 2020-10-30 NOTE — Progress Notes (Signed)
Remote pacemaker transmission.   

## 2020-12-10 DIAGNOSIS — M0579 Rheumatoid arthritis with rheumatoid factor of multiple sites without organ or systems involvement: Secondary | ICD-10-CM | POA: Diagnosis not present

## 2020-12-10 DIAGNOSIS — G8929 Other chronic pain: Secondary | ICD-10-CM | POA: Diagnosis not present

## 2020-12-10 DIAGNOSIS — M810 Age-related osteoporosis without current pathological fracture: Secondary | ICD-10-CM | POA: Diagnosis not present

## 2020-12-10 DIAGNOSIS — M545 Low back pain, unspecified: Secondary | ICD-10-CM | POA: Diagnosis not present

## 2020-12-10 DIAGNOSIS — Z79899 Other long term (current) drug therapy: Secondary | ICD-10-CM | POA: Diagnosis not present

## 2021-01-20 ENCOUNTER — Ambulatory Visit (INDEPENDENT_AMBULATORY_CARE_PROVIDER_SITE_OTHER): Payer: Medicare Other

## 2021-01-20 DIAGNOSIS — I442 Atrioventricular block, complete: Secondary | ICD-10-CM

## 2021-01-21 ENCOUNTER — Telehealth: Payer: Self-pay

## 2021-01-21 NOTE — Telephone Encounter (Signed)
Patient called you wanting to send a transmission as her monitor was not plugged up and she needs help transmitting. Her transmission has been sent successfully

## 2021-01-23 LAB — CUP PACEART REMOTE DEVICE CHECK
Battery Remaining Longevity: 108 mo
Battery Remaining Percentage: 95.5 %
Battery Voltage: 2.99 V
Brady Statistic AP VP Percent: 1 %
Brady Statistic AP VS Percent: 1 %
Brady Statistic AS VP Percent: 99 %
Brady Statistic AS VS Percent: 1 %
Brady Statistic RA Percent Paced: 1 %
Brady Statistic RV Percent Paced: 99 %
Date Time Interrogation Session: 20220614142240
Implantable Lead Implant Date: 20200313
Implantable Lead Implant Date: 20200313
Implantable Lead Location: 753859
Implantable Lead Location: 753860
Implantable Lead Model: 5076
Implantable Lead Model: 5076
Implantable Pulse Generator Implant Date: 20200313
Lead Channel Impedance Value: 410 Ohm
Lead Channel Impedance Value: 490 Ohm
Lead Channel Pacing Threshold Amplitude: 0.5 V
Lead Channel Pacing Threshold Amplitude: 0.75 V
Lead Channel Pacing Threshold Pulse Width: 0.5 ms
Lead Channel Pacing Threshold Pulse Width: 0.5 ms
Lead Channel Sensing Intrinsic Amplitude: 11.3 mV
Lead Channel Sensing Intrinsic Amplitude: 2.4 mV
Lead Channel Setting Pacing Amplitude: 2 V
Lead Channel Setting Pacing Amplitude: 2.5 V
Lead Channel Setting Pacing Pulse Width: 0.5 ms
Lead Channel Setting Sensing Sensitivity: 4 mV
Pulse Gen Model: 2272
Pulse Gen Serial Number: 9118930

## 2021-01-27 DIAGNOSIS — Z96653 Presence of artificial knee joint, bilateral: Secondary | ICD-10-CM | POA: Diagnosis not present

## 2021-02-11 NOTE — Progress Notes (Signed)
Remote pacemaker transmission.   

## 2021-02-13 DIAGNOSIS — M810 Age-related osteoporosis without current pathological fracture: Secondary | ICD-10-CM | POA: Diagnosis not present

## 2021-02-14 ENCOUNTER — Encounter: Payer: Self-pay | Admitting: Nurse Practitioner

## 2021-02-14 ENCOUNTER — Other Ambulatory Visit: Payer: Self-pay

## 2021-02-14 ENCOUNTER — Non-Acute Institutional Stay: Payer: Medicare Other | Admitting: Nurse Practitioner

## 2021-02-14 VITALS — BP 130/68 | HR 84 | Resp 18 | Wt 155.0 lb

## 2021-02-14 DIAGNOSIS — K529 Noninfective gastroenteritis and colitis, unspecified: Secondary | ICD-10-CM | POA: Diagnosis not present

## 2021-02-14 DIAGNOSIS — Z515 Encounter for palliative care: Secondary | ICD-10-CM | POA: Diagnosis not present

## 2021-02-14 DIAGNOSIS — R0602 Shortness of breath: Secondary | ICD-10-CM

## 2021-02-14 NOTE — Progress Notes (Signed)
[facility] Consult Note Telephone: [phone]  Fax: [phone]    Date of encounter: 02/14/21 [name] [id number]   [phone] (home)  DOB: [birth year] MRN: [id number] PRIMARY CARE PROVIDER:   Rutland [name]/Dr Meghan [name]  RESPONSIBLE PARTY:    Contact Information     Name Relation Home Work [place], [place] Relative   [phone]   Meghan [name]   [phone]   Meghan [name] Daughter   [phone]      I met face to face with patient in facility. Palliative Care was asked to follow this patient by consultation request of  Dr Meghan [name] to address advance care planning and complex medical decision making. This is a follow up visit.  ASSESSMENT AND PLAN / RECOMMENDATIONS:  Symptom Management/Plan: 1. ACP: Medical goals consists DNR, do not intubate. Wishes are for more conservative care to include antibiotics, IV fluids, blood transfusions, diagnostic testing, lab testing. Wishes are to minimize hospitalizations if necessary.    2. Dyspneic secondary to congestive heart failure remain stable at present time. Continue daily weights and outpatient Cardiology appointments  3. Chronic Intermit diarrhea; discussed nutrition with Meghan [name], pattern, occurrence; will f/u with primary for GI consult for workup to include colonscopy as it has been many years since she has had a colonoscopy, Meghan [name] in agreement. Notified Meghan Linsey NP/[facility]   4. Palliative care encounter; Palliative medicine team will continue to support patient, patient's family, and medical team. Visit consisted of counseling and education dealing with the complex and emotionally intense issues of symptom management and palliative   Follow up Palliative Care Visit: Palliative care will continue to follow for complex medical decision making, advance care planning, and clarification of goals.  Return 8 weeks or prn.  I spent 35 minutes providing this consultation. More than 50% of the time in this consultation was spent in counseling and care coordination.  PPS: 50%  Chief Complaint: Follow up Palliative consult for complex medical decision making  HISTORY OF PRESENT ILLNESS:  Meghan [name] is a 75 y.o. year old female  with COPD, congestive heart failure with ef 20%, asthma, collagen vascular disease, coronary artery disease, heart murmur, benign brain tumor, hypertension, gerd, hypercholesterolemia, history of headache, diabetes, gerd, arthritis, osteoporosis, lumbar degenerative disc disease, anxiety, PTSD, depression, kyphoplasty, right hand reconstruction 2011, cholecystectomy, cervical fusion, abdominal hysterectomy. Meghan [name] continues to reside in [place] has [facility]. Meghan [name] takes steps and is ambulatory in her room. Meghan [name]  does need assistance with adl but tries to be as independent as possible. Meghan [name]  does feed herself with varied appetite. Psychiatry 01/16/2021 bipolar disorder prescribed lexapro, lamictal for mood. Remeron for appetite, mood. Mother recently passed away. Anxiety with PTSD prescribed klonapin and buspar with psychotherapy. No new changes at this visit. Staff endorses no recent hospitalizations. She was seen in ED for a fall since last PC visit. I visited and observed Meghan [name]. We talked about purpose of PC visit, Meghan [name] in agreement. We talked about how Meghan [name] was feeling. We talked about symptoms of pain which she does intermit experience with her RA relieved with current regimen. Meghan [name] endorses she continues to try to be as independent as possible. Meghan [name] continues to walk with walker but independent in room. We talked about her requiring assistance for ADL's. We talked about her appetite. Ms.  [name] talked about the food at the facility. Nausea has improved. Diarrhea continues intermit.  Discussed nutrition, appetite with Meghan [name], pattern, occurrence; will f/u with primary for GI consult for workup to include colonscopy as it has been many years since she has had a colonoscopy, Meghan [name] in agreement. Notified Meghan Linsey NP/[facility]. No further symptoms. Meghan [name] continues to work on her tablet computer playing games. We talked about her visits with psychotherapy which she finds very helpful. We talked about medical goals. We talked about residing at facility, she has been for many years now. We talked about Meghan [name] using on her tablet, games she playes. We talked about quality of life. We talked about role PC in poc. Disucssed f/u 2 months if needed or sooner if declines. Meghan [name] in agreement. Therapeutic listening, emotional support provided. Questions answered. I updated staff, no new changes to poc  History obtained from review of EMR, discussion with facility staff and Meghan [name].  I reviewed available labs, medications, imaging, studies and related documents from the EMR.  Records reviewed and summarized above.   ROS Full 14 system review of systems performed and negative with exception of: as per HPI.   Physical Exam: Constitutional: NAD General: frail appearing, chronically ill pleasant female, debilitated EYES: lids intact ENMT: intact hearing, oral mucous membranes moist, dentition intact CV: S1S2, RRR Pulmonary: LCTA, no increased work of breathing, no cough, room air Abdomen: soft and non tender MSK: ambulatory with walker Skin: warm and dry Neuro:  no generalized weakness,  no cognitive impairment Psych: non-anxious affect, A and O x 3  Questions and concerns were addressed. The patient was encouraged to call with questions and/or concerns. My contact information was provided. Provided general support and encouragement, no other unmet needs identified   Thank you for the opportunity to participate in the care of Meghan [name].  The palliative care team will  continue to follow. Please call our office at [phone] if we can be of additional assistance.   This chart was dictated using voice recognition software.  Despite best efforts to proofread,  errors can occur which can change the documentation meaning.   Agron Swiney Ihor Gully, NP

## 2021-02-25 DIAGNOSIS — M5136 Other intervertebral disc degeneration, lumbar region: Secondary | ICD-10-CM | POA: Diagnosis not present

## 2021-02-25 DIAGNOSIS — R296 Repeated falls: Secondary | ICD-10-CM | POA: Diagnosis not present

## 2021-02-25 DIAGNOSIS — M0589 Other rheumatoid arthritis with rheumatoid factor of multiple sites: Secondary | ICD-10-CM | POA: Diagnosis not present

## 2021-02-25 DIAGNOSIS — M810 Age-related osteoporosis without current pathological fracture: Secondary | ICD-10-CM | POA: Diagnosis not present

## 2021-02-25 DIAGNOSIS — M545 Low back pain, unspecified: Secondary | ICD-10-CM | POA: Diagnosis not present

## 2021-03-07 ENCOUNTER — Other Ambulatory Visit: Payer: Self-pay | Admitting: Ophthalmology

## 2021-03-07 DIAGNOSIS — H34231 Retinal artery branch occlusion, right eye: Secondary | ICD-10-CM | POA: Diagnosis not present

## 2021-03-13 DIAGNOSIS — B351 Tinea unguium: Secondary | ICD-10-CM | POA: Diagnosis not present

## 2021-03-13 DIAGNOSIS — E1151 Type 2 diabetes mellitus with diabetic peripheral angiopathy without gangrene: Secondary | ICD-10-CM | POA: Diagnosis not present

## 2021-03-13 DIAGNOSIS — L603 Nail dystrophy: Secondary | ICD-10-CM | POA: Diagnosis not present

## 2021-03-24 ENCOUNTER — Ambulatory Visit
Admission: RE | Admit: 2021-03-24 | Discharge: 2021-03-24 | Disposition: A | Discharge status: Home / Self Care | Payer: Medicare Other | Source: Ambulatory Visit | Attending: Ophthalmology | Admitting: Ophthalmology

## 2021-03-24 ENCOUNTER — Other Ambulatory Visit: Payer: Self-pay

## 2021-03-24 DIAGNOSIS — H34233 Retinal artery branch occlusion, bilateral: Secondary | ICD-10-CM | POA: Diagnosis not present

## 2021-03-24 DIAGNOSIS — H34231 Retinal artery branch occlusion, right eye: Secondary | ICD-10-CM

## 2021-03-24 DIAGNOSIS — I6523 Occlusion and stenosis of bilateral carotid arteries: Secondary | ICD-10-CM | POA: Diagnosis not present

## 2021-04-02 ENCOUNTER — Encounter: Payer: Self-pay | Admitting: Nurse Practitioner

## 2021-04-02 ENCOUNTER — Non-Acute Institutional Stay: Payer: Medicare Other | Admitting: Nurse Practitioner

## 2021-04-02 VITALS — BP 130/36 | HR 84 | Temp 96.0°F | Resp 18 | Wt 151.2 lb

## 2021-04-02 DIAGNOSIS — Z515 Encounter for palliative care: Secondary | ICD-10-CM

## 2021-04-02 DIAGNOSIS — J449 Chronic obstructive pulmonary disease, unspecified: Secondary | ICD-10-CM

## 2021-04-02 DIAGNOSIS — R0602 Shortness of breath: Secondary | ICD-10-CM | POA: Diagnosis not present

## 2021-04-02 NOTE — Progress Notes (Signed)
[facility] Consult Note Telephone: [phone]  Fax: [phone]    Date of encounter: 04/02/21 7:55 PM [name] [id number]   [phone] (home)  DOB: [birth year] MRN: [id number] PRIMARY CARE PROVIDER:    Dr Lucianne Lei [facility]  RESPONSIBLE PARTY:    Contact Information     Name Relation Home Work [place], [place] Relative   [phone]   Azzie Glatter   [place], [place] Daughter   [phone]      I met face to face with patient in facility. Palliative Care was asked to follow this patient by consultation request of  Dr Nona Dell to address advance care planning and complex medical decision making. This is a follow up visit.                                  ASSESSMENT AND PLAN / RECOMMENDATIONS:   Symptom Management/Plan:  1. ACP: Medical goals consists DNR, do not intubate. Wishes are for more conservative care to include antibiotics, IV fluids, blood transfusions, diagnostic testing, lab testing. Wishes are to minimize hospitalizations if necessary.    2. Shortness of breath secondary to congestive heart failure/COPD Continue daily weights and outpatient Cardiology appointments, discussed nebulizers with inhalation therapy. Will f/u [facility] NP   3. Palliative care encounter; Palliative medicine team will continue to support patient, patient's family, and medical team. Visit consisted of counseling and education dealing with the complex and emotionally intense issues of symptom management and palliative    5. F/u 2 months for ongoing monitoring disease progression, chronic nausea, debility.   Follow up Palliative Care Visit: Palliative care will continue to follow for complex medical decision making, advance care planning, and clarification of goals. Return 8 weeks or prn.  I spent 37 minutes providing this consultation. More than 50% of  the time in this consultation was spent in counseling and care coordination. PPS: 50%  Chief Complaint: Follow up palliative consult for complex medical decision making  HISTORY OF PRESENT ILLNESS:  Meghan [name] is a 75 y.o. year old female  with  multiple medical problems including COPD, congestive heart failure with ef 20%, asthma, collagen vascular disease, coronary artery disease, heart murmur, benign brain tumor, hypertension, gerd, hypercholesterolemia, history of headache, diabetes, gerd, arthritis, osteoporosis, lumbar degenerative disc disease, anxiety, PTSD, depression, kyphoplasty, right hand reconstruction 2011, cholecystectomy, cervical fusion, abdominal hysterectomy. Meghan [name] continues to reside in [place] at [facility] center. Meghan [name] is able to get up out of bed ambulate short distances in her room. Meghan [name] does require assistance to perform adls as she is difficulty with her hands. Meghan [name] does feed herself in appetite varies depending on how she is feeling. Staff endorses that she does continue to have episodes of intermittent diarrhea with nausea but is relieved with anti diarrhea medicine and anti emetic. Staff endorses no new changes or concerns. No recent hospitalizations, infections, wounds, falls. At present Meghan [name] is lying in bed, completing her lunch. Meghan [name] does appear comfortable no visitors present. Meghan [name] and I we talked about purpose of palliative care visit. We talked about as she has been feeling today. Meghan [name] endorses today is a so so day. Meghan [name] food does not taste too good for lunch and she had a little bit  of nausea for which she has already addressed with staff. We talked about diarrhea which currently has been resolved.we talked about her appetite. We talked about medical goals of care period we talked about chronic disease with progression. We talked about residing at skilled facilitywith realistic expectations. We  talked about barriers and coping strategies. Meghan [name] endorses some days are better than others. Meghan [name] spends a fair amount of time playing on her tablet games and different things she likes to do on her computer. We talked about ongoing psychotherapy for what she does receive at the facility. We talked about Meghan [name] declining her nebulizer treatments. Meghan [name] endorses the shortness of breath has improved and the nebulizer treatments make her feel very funny. We talked about options of HFA. Meghan [name] endorses that she will further discuss with [facility] Nurse Practitioner. We talked about the purpose of nebulizer and the side effect she has been feeling from it. We talked about role of palliative care and plan of care . Discussed will follow up in two months if needed or sooner should she decline. Meghan [name] in agreement, updated staff no new changes today.    History obtained from review of EMR, discussion with facility staff and Meghan. Bennett [name].  I reviewed available labs, medications, imaging, studies and related documents from the EMR.  Records reviewed and summarized above.   ROS Full 14 system review of systems performed and negative with exception of: as per HPI.   Physical Exam: Constitutional: NAD General: pleasant debilitated female EYES: lids intact, ENMT: oral mucous membranes moist CV: S1S2, RRR, mild BLE edema Pulmonary: LCTA, no increased work of breathing, no cough, room air Abdomen: normo-active BS + 4 quadrants, soft and non tender MSK: ambulatory Skin: warm and dry, no rashes or wounds on visible skin Neuro:  + generalized weakness,  no cognitive impairment Psych: non-anxious affect, A and O x 3  Questions and concerns were addressed. Provided general support and encouragement, no other unmet needs identified   Thank you for the opportunity to participate in the care of Meghan. Bennett [name].  The palliative care team will continue to follow. Please call our office at [phone] if we can be  of additional assistance.   This chart was dictated using voice recognition software.  Despite best efforts to proofread,  errors can occur which can change the documentation meaning.   Mel Tadros Ihor Gully, NP

## 2021-04-03 ENCOUNTER — Other Ambulatory Visit: Payer: Self-pay

## 2021-04-16 DIAGNOSIS — R262 Difficulty in walking, not elsewhere classified: Secondary | ICD-10-CM | POA: Diagnosis not present

## 2021-04-16 DIAGNOSIS — I5042 Chronic combined systolic (congestive) and diastolic (congestive) heart failure: Secondary | ICD-10-CM | POA: Diagnosis not present

## 2021-04-16 DIAGNOSIS — Z1159 Encounter for screening for other viral diseases: Secondary | ICD-10-CM | POA: Diagnosis not present

## 2021-04-16 DIAGNOSIS — Z5181 Encounter for therapeutic drug level monitoring: Secondary | ICD-10-CM | POA: Diagnosis not present

## 2021-04-17 DIAGNOSIS — R262 Difficulty in walking, not elsewhere classified: Secondary | ICD-10-CM | POA: Diagnosis not present

## 2021-04-17 DIAGNOSIS — I5042 Chronic combined systolic (congestive) and diastolic (congestive) heart failure: Secondary | ICD-10-CM | POA: Diagnosis not present

## 2021-04-17 DIAGNOSIS — Z1159 Encounter for screening for other viral diseases: Secondary | ICD-10-CM | POA: Diagnosis not present

## 2021-04-18 DIAGNOSIS — Z1159 Encounter for screening for other viral diseases: Secondary | ICD-10-CM | POA: Diagnosis not present

## 2021-04-18 DIAGNOSIS — I5042 Chronic combined systolic (congestive) and diastolic (congestive) heart failure: Secondary | ICD-10-CM | POA: Diagnosis not present

## 2021-04-18 DIAGNOSIS — R262 Difficulty in walking, not elsewhere classified: Secondary | ICD-10-CM | POA: Diagnosis not present

## 2021-04-21 ENCOUNTER — Ambulatory Visit (INDEPENDENT_AMBULATORY_CARE_PROVIDER_SITE_OTHER): Payer: Medicare Other

## 2021-04-21 DIAGNOSIS — I442 Atrioventricular block, complete: Secondary | ICD-10-CM | POA: Diagnosis not present

## 2021-04-22 DIAGNOSIS — Z1159 Encounter for screening for other viral diseases: Secondary | ICD-10-CM | POA: Diagnosis not present

## 2021-04-22 DIAGNOSIS — R262 Difficulty in walking, not elsewhere classified: Secondary | ICD-10-CM | POA: Diagnosis not present

## 2021-04-22 DIAGNOSIS — I5042 Chronic combined systolic (congestive) and diastolic (congestive) heart failure: Secondary | ICD-10-CM | POA: Diagnosis not present

## 2021-04-22 LAB — CUP PACEART REMOTE DEVICE CHECK
Battery Remaining Longevity: 79 mo
Battery Remaining Percentage: 74 %
Battery Voltage: 2.98 V
Brady Statistic AP VP Percent: 1 %
Brady Statistic AP VS Percent: 1 %
Brady Statistic AS VP Percent: 99 %
Brady Statistic AS VS Percent: 1 %
Brady Statistic RA Percent Paced: 1 %
Brady Statistic RV Percent Paced: 99 %
Date Time Interrogation Session: 20220912020031
Implantable Lead Implant Date: 20200313
Implantable Lead Implant Date: 20200313
Implantable Lead Location: 753859
Implantable Lead Location: 753860
Implantable Lead Model: 5076
Implantable Lead Model: 5076
Implantable Pulse Generator Implant Date: 20200313
Lead Channel Impedance Value: 430 Ohm
Lead Channel Impedance Value: 540 Ohm
Lead Channel Pacing Threshold Amplitude: 0.5 V
Lead Channel Pacing Threshold Amplitude: 0.75 V
Lead Channel Pacing Threshold Pulse Width: 0.5 ms
Lead Channel Pacing Threshold Pulse Width: 0.5 ms
Lead Channel Sensing Intrinsic Amplitude: 11.5 mV
Lead Channel Sensing Intrinsic Amplitude: 2.9 mV
Lead Channel Setting Pacing Amplitude: 2 V
Lead Channel Setting Pacing Amplitude: 2.5 V
Lead Channel Setting Pacing Pulse Width: 0.5 ms
Lead Channel Setting Sensing Sensitivity: 4 mV
Pulse Gen Model: 2272
Pulse Gen Serial Number: 9118930

## 2021-04-23 DIAGNOSIS — I5042 Chronic combined systolic (congestive) and diastolic (congestive) heart failure: Secondary | ICD-10-CM | POA: Diagnosis not present

## 2021-04-23 DIAGNOSIS — Z1159 Encounter for screening for other viral diseases: Secondary | ICD-10-CM | POA: Diagnosis not present

## 2021-04-23 DIAGNOSIS — R262 Difficulty in walking, not elsewhere classified: Secondary | ICD-10-CM | POA: Diagnosis not present

## 2021-04-24 DIAGNOSIS — Z1159 Encounter for screening for other viral diseases: Secondary | ICD-10-CM | POA: Diagnosis not present

## 2021-04-24 DIAGNOSIS — I5042 Chronic combined systolic (congestive) and diastolic (congestive) heart failure: Secondary | ICD-10-CM | POA: Diagnosis not present

## 2021-04-24 DIAGNOSIS — R262 Difficulty in walking, not elsewhere classified: Secondary | ICD-10-CM | POA: Diagnosis not present

## 2021-04-25 NOTE — Progress Notes (Signed)
Remote pacemaker transmission.   

## 2021-04-26 DIAGNOSIS — I5042 Chronic combined systolic (congestive) and diastolic (congestive) heart failure: Secondary | ICD-10-CM | POA: Diagnosis not present

## 2021-04-26 DIAGNOSIS — Z1159 Encounter for screening for other viral diseases: Secondary | ICD-10-CM | POA: Diagnosis not present

## 2021-04-26 DIAGNOSIS — R262 Difficulty in walking, not elsewhere classified: Secondary | ICD-10-CM | POA: Diagnosis not present

## 2021-04-28 DIAGNOSIS — R262 Difficulty in walking, not elsewhere classified: Secondary | ICD-10-CM | POA: Diagnosis not present

## 2021-04-28 DIAGNOSIS — W19XXXA Unspecified fall, initial encounter: Secondary | ICD-10-CM | POA: Diagnosis not present

## 2021-04-28 DIAGNOSIS — Z1159 Encounter for screening for other viral diseases: Secondary | ICD-10-CM | POA: Diagnosis not present

## 2021-04-28 DIAGNOSIS — M79661 Pain in right lower leg: Secondary | ICD-10-CM | POA: Diagnosis not present

## 2021-04-28 DIAGNOSIS — I5042 Chronic combined systolic (congestive) and diastolic (congestive) heart failure: Secondary | ICD-10-CM | POA: Diagnosis not present

## 2021-04-29 DIAGNOSIS — Z1159 Encounter for screening for other viral diseases: Secondary | ICD-10-CM | POA: Diagnosis not present

## 2021-04-29 DIAGNOSIS — I5042 Chronic combined systolic (congestive) and diastolic (congestive) heart failure: Secondary | ICD-10-CM | POA: Diagnosis not present

## 2021-04-29 DIAGNOSIS — R262 Difficulty in walking, not elsewhere classified: Secondary | ICD-10-CM | POA: Diagnosis not present

## 2021-04-30 DIAGNOSIS — Z1159 Encounter for screening for other viral diseases: Secondary | ICD-10-CM | POA: Diagnosis not present

## 2021-04-30 DIAGNOSIS — I5042 Chronic combined systolic (congestive) and diastolic (congestive) heart failure: Secondary | ICD-10-CM | POA: Diagnosis not present

## 2021-04-30 DIAGNOSIS — R262 Difficulty in walking, not elsewhere classified: Secondary | ICD-10-CM | POA: Diagnosis not present

## 2021-05-01 DIAGNOSIS — I5042 Chronic combined systolic (congestive) and diastolic (congestive) heart failure: Secondary | ICD-10-CM | POA: Diagnosis not present

## 2021-05-01 DIAGNOSIS — Z1159 Encounter for screening for other viral diseases: Secondary | ICD-10-CM | POA: Diagnosis not present

## 2021-05-01 DIAGNOSIS — R262 Difficulty in walking, not elsewhere classified: Secondary | ICD-10-CM | POA: Diagnosis not present

## 2021-05-02 DIAGNOSIS — I5042 Chronic combined systolic (congestive) and diastolic (congestive) heart failure: Secondary | ICD-10-CM | POA: Diagnosis not present

## 2021-05-02 DIAGNOSIS — Z1159 Encounter for screening for other viral diseases: Secondary | ICD-10-CM | POA: Diagnosis not present

## 2021-05-02 DIAGNOSIS — R262 Difficulty in walking, not elsewhere classified: Secondary | ICD-10-CM | POA: Diagnosis not present

## 2021-05-05 DIAGNOSIS — R262 Difficulty in walking, not elsewhere classified: Secondary | ICD-10-CM | POA: Diagnosis not present

## 2021-05-05 DIAGNOSIS — I5042 Chronic combined systolic (congestive) and diastolic (congestive) heart failure: Secondary | ICD-10-CM | POA: Diagnosis not present

## 2021-05-05 DIAGNOSIS — Z1159 Encounter for screening for other viral diseases: Secondary | ICD-10-CM | POA: Diagnosis not present

## 2021-05-07 DIAGNOSIS — I5042 Chronic combined systolic (congestive) and diastolic (congestive) heart failure: Secondary | ICD-10-CM | POA: Diagnosis not present

## 2021-05-07 DIAGNOSIS — R262 Difficulty in walking, not elsewhere classified: Secondary | ICD-10-CM | POA: Diagnosis not present

## 2021-05-07 DIAGNOSIS — Z1159 Encounter for screening for other viral diseases: Secondary | ICD-10-CM | POA: Diagnosis not present

## 2021-05-08 DIAGNOSIS — R262 Difficulty in walking, not elsewhere classified: Secondary | ICD-10-CM | POA: Diagnosis not present

## 2021-05-08 DIAGNOSIS — Z1159 Encounter for screening for other viral diseases: Secondary | ICD-10-CM | POA: Diagnosis not present

## 2021-05-08 DIAGNOSIS — I5042 Chronic combined systolic (congestive) and diastolic (congestive) heart failure: Secondary | ICD-10-CM | POA: Diagnosis not present

## 2021-05-09 DIAGNOSIS — Z1159 Encounter for screening for other viral diseases: Secondary | ICD-10-CM | POA: Diagnosis not present

## 2021-05-09 DIAGNOSIS — I5042 Chronic combined systolic (congestive) and diastolic (congestive) heart failure: Secondary | ICD-10-CM | POA: Diagnosis not present

## 2021-05-09 DIAGNOSIS — R262 Difficulty in walking, not elsewhere classified: Secondary | ICD-10-CM | POA: Diagnosis not present

## 2021-05-24 ENCOUNTER — Emergency Department: Payer: Medicare Other

## 2021-05-24 ENCOUNTER — Other Ambulatory Visit: Payer: Self-pay

## 2021-05-24 ENCOUNTER — Emergency Department
Admission: EM | Admit: 2021-05-24 | Discharge: 2021-05-24 | Disposition: A | Discharge status: Home / Self Care | Payer: Medicare Other | Attending: Student in an Organized Health Care Education/Training Program | Admitting: Student in an Organized Health Care Education/Training Program

## 2021-05-24 DIAGNOSIS — W010XXA Fall on same level from slipping, tripping and stumbling without subsequent striking against object, initial encounter: Secondary | ICD-10-CM | POA: Diagnosis not present

## 2021-05-24 DIAGNOSIS — J449 Chronic obstructive pulmonary disease, unspecified: Secondary | ICD-10-CM | POA: Insufficient documentation

## 2021-05-24 DIAGNOSIS — S3992XA Unspecified injury of lower back, initial encounter: Secondary | ICD-10-CM | POA: Insufficient documentation

## 2021-05-24 DIAGNOSIS — S0990XA Unspecified injury of head, initial encounter: Secondary | ICD-10-CM

## 2021-05-24 DIAGNOSIS — S79911A Unspecified injury of right hip, initial encounter: Secondary | ICD-10-CM | POA: Diagnosis not present

## 2021-05-24 DIAGNOSIS — I110 Hypertensive heart disease with heart failure: Secondary | ICD-10-CM | POA: Diagnosis not present

## 2021-05-24 DIAGNOSIS — E119 Type 2 diabetes mellitus without complications: Secondary | ICD-10-CM | POA: Insufficient documentation

## 2021-05-24 DIAGNOSIS — J45909 Unspecified asthma, uncomplicated: Secondary | ICD-10-CM | POA: Insufficient documentation

## 2021-05-24 DIAGNOSIS — M25559 Pain in unspecified hip: Secondary | ICD-10-CM

## 2021-05-24 DIAGNOSIS — I5023 Acute on chronic systolic (congestive) heart failure: Secondary | ICD-10-CM | POA: Diagnosis not present

## 2021-05-24 DIAGNOSIS — I2510 Atherosclerotic heart disease of native coronary artery without angina pectoris: Secondary | ICD-10-CM | POA: Diagnosis not present

## 2021-05-24 MED ORDER — ONDANSETRON 4 MG PO TBDP
4.0000 mg | ORAL_TABLET | Freq: Once | ORAL | Status: AC
  Administered 2021-05-24: 4 mg via ORAL
  Filled 2021-05-24: qty 1

## 2021-05-24 MED ORDER — ACETAMINOPHEN 325 MG PO TABS
650.0000 mg | ORAL_TABLET | Freq: Once | ORAL | Status: AC
  Administered 2021-05-24: 650 mg via ORAL
  Filled 2021-05-24: qty 2

## 2021-05-24 NOTE — ED Triage Notes (Signed)
Pt in via EMS from [place] with c/o fall. Pt reports tripped over her wheelchair that was parked beside of her bed that she was trying to scoot around. Pt c/o tenderness to back sideof head, sacrum pain and pain to right hip which is not new per pt.

## 2021-05-24 NOTE — ED Notes (Signed)
Pt transported to CT via stretcher at this time.  

## 2021-05-24 NOTE — ED Notes (Signed)
Pt laying calmly in recliner; resp reg/unlabored; skin dry.

## 2021-05-24 NOTE — ED Notes (Signed)
[facility] center contacted [place] transportation for pt. No transportation available so [facility] will transport back to facility. Report given to [place] at [place]. No questions or concerns. Pt assisted to stretcher at this time.

## 2021-05-24 NOTE — ED Notes (Signed)
Pt resting in recliner at bedside. Pt provided with pillow and warm blanket, lights dimmed. States nausea is improved and pain is a 8/10. Call bell is within reach. Transportation is being arranged for discharge back to [facility] and rehab.

## 2021-05-24 NOTE — ED Notes (Signed)
MD at bedside assessing pt at this time.

## 2021-05-24 NOTE — ED Triage Notes (Addendum)
Pt did not lose consciousness. Not on thinners. States chronic hip pain. Aox4, sitting in recliner.

## 2021-05-24 NOTE — ED Provider Notes (Signed)
[facility] Emergency Department Provider Note    Event Date/Time   First MD Initiated Contact with Patient 05/24/21 1825     (approximate)  I have reviewed the triage vital signs and the nursing notes.   HISTORY  Chief Complaint Fall    HPI Meghan [name] is a 75 y.o. female below listed past medical history presents to the ER for evaluation of minor head injury hip pain low back pain occurred after mechanical fall.  States that she typically uses a walker or wheelchair to get around and tripped over her device.  Denies any LOC.  States she been having long history of hip pain is not anything new.  No new numbness or tingling.  Denies any chest pain or shortness of breath no palpitations,  + nausea or vomiting.  Past Medical History:  Diagnosis Date   Anginal pain ([place])    Anxiety    Arthritis    RA   Asthma    Brain tumor (benign) (HCC)    CHF (congestive heart failure) (HCC)    Collagen vascular disease (HCC)    Complete heart block (HCC)    COPD (chronic obstructive pulmonary disease) (HCC)    Coronary artery disease    DDD (degenerative disc disease)    Depression    Diabetes mellitus    GERD (gastroesophageal reflux disease)    Headache    Heart murmur    Hypercholesteremia    Hypertension    Lumbar degenerative disc disease    Migraines    Obesity    Osteopenia    Osteoporosis    Pacemaker- [employer]    Pneumonia    PTSD (post-traumatic stress disorder)    Family History  Problem Relation Age of Onset   Depression Sister    Migraines Sister    Hypertension Mother    Arthritis/Rheumatoid Mother    Heart attack Father    Hypertension Father    CAD Other    Hypertension Other    Diabetes Mellitus II Other    Arthritis Other    Past Surgical History:  Procedure Laterality Date   ABDOMINAL HYSTERECTOMY     CERVICAL FUSION     CHOLECYSTECTOMY     COLONOSCOPY WITH PROPOFOL N/A 09/20/2015   Procedure: COLONOSCOPY WITH PROPOFOL;   Surgeon: Josefine Class, MD;  Location: [facility] ENDOSCOPY;  Service: Endoscopy;  Laterality: N/A;   COLONOSCOPY WITH PROPOFOL N/A 01/27/2016   Procedure: COLONOSCOPY WITH PROPOFOL;  Surgeon: Manya Silvas, MD;  Location: [facility] ENDOSCOPY;  Service: Endoscopy;  Laterality: N/A;   FINGER ARTHROPLASTY  02/23/2012   Procedure: FINGER ARTHROPLASTY;  Surgeon: Cammie Sickle., MD;  Location: [place];  Service: Orthopedics;  Laterality: Left;  Extensor carpi radialis longus to Extensor carpi ulnaris transfer, left Metaphalangeal reconstructions of index and long fingers,   FOOT ARTHROPLASTY     toes x2 rt foot   HAND RECONSTRUCTION  2011   right-multiple finger joint reconst   JOINT REPLACEMENT     bilat knee replacements   KYPHOPLASTY N/A 06/14/2017   Procedure: KYPHOPLASTY L1;  Surgeon: Hessie Knows, MD;  Location: [facility] ORS;  Service: Orthopedics;  Laterality: N/A;   LOOP RECORDER INSERTION N/A 06/23/2017   Procedure: LOOP RECORDER INSERTION;  Surgeon: Isaias Cowman, MD;  Location: [place] CV LAB;  Service: Cardiovascular;  Laterality: N/A;   LOOP RECORDER REMOVAL N/A 10/21/2018   Procedure: LOOP RECORDER REMOVAL;  Surgeon: Deboraha Sprang, MD;  Location: [place] CV LAB;  Service: Cardiovascular;  Laterality: N/A;   ORIF FEMUR FRACTURE Right 10/22/2018   Procedure: OPEN REDUCTION INTERNAL FIXATION (ORIF) DISTAL FEMUR FRACTURE;  Surgeon: Marchia Bond, MD;  Location: [place];  Service: Orthopedics;  Laterality: Right;   PACEMAKER IMPLANT N/A 10/21/2018   Procedure: PACEMAKER IMPLANT;  Surgeon: Deboraha Sprang, MD;  Location: [place] CV LAB;  Service: Cardiovascular;  Laterality: N/A;   Patient Active Problem List   Diagnosis Date Noted   Dyspnea 11/08/2018   Complete heart block ([place]) 10/21/2018   Closed fracture of distal end of right femur ([place]), periprosthetic 10/21/2018   Palliative care encounter 09/01/2018   Weakness generalized 09/01/2018    Shortness of breath 09/01/2018   Syncope 05/05/2017   Acute on chronic systolic CHF (congestive heart failure) ([place]) 04/16/2017   Acute lower UTI 04/16/2017   Acute respiratory failure ([place]) 04/16/2017   Pressure injury of skin 03/22/2017   Acute on chronic respiratory failure with hypoxia ([place]) 03/20/2017   Acute respiratory failure with hypoxia (HCC) 03/20/2017   Near syncope 02/27/2017   Dehydration 02/27/2017   Colitis 02/22/2017   Seizure ([place]) 01/03/2016   Chronic tension-type headache, intractable 12/04/2015   Olfactory hallucination 12/04/2015   Degeneration of intervertebral disc of lumbar region 07/15/2015   Seropositive rheumatoid arthritis ([place]) 07/15/2015   Asthma with acute exacerbation 03/10/2015   Hypokalemia 03/01/2015   Hyponatremia 03/01/2015   DDD (degenerative disc disease), lumbar 01/31/2015   Arthritis, degenerative 01/31/2015   Rheumatoid arthritis with rheumatoid factor ([place]) 01/31/2015   HTN (hypertension) 12/24/2014   Sepsis ([place]) 12/24/2014   Left knee pain 12/24/2014   GERD (gastroesophageal reflux disease) 12/24/2014   COPD (chronic obstructive pulmonary disease) ([place]) 12/24/2014   Depression 12/24/2014   Anxiety 12/24/2014   Severe bipolar disorder with psychotic features, mood-congruent ([place]) 11/16/2014   Neurosis, posttraumatic 11/16/2014   H/O gastric ulcer 11/16/2014   Barton's fracture of distal radius, closed 09/19/2014   Neuritis or radiculitis due to rupture of lumbar intervertebral disc 05/11/2014   Cervico-occipital neuralgia 03/26/2014   Difficulty in walking 03/26/2014   Difficulty in walking, not elsewhere classified 03/26/2014   Cervical spine syndrome 01/26/2014   Cephalalgia 01/09/2014   Disordered sleep 01/09/2014   BP (high blood pressure) 10/19/2013   Adiposity 10/19/2013   Cardiac murmur 10/19/2013   Breath shortness 10/19/2013   Chronic obstructive pulmonary disease ([place]) 10/19/2013   Diabetes mellitus ([place]) 10/19/2013       Prior to Admission medications   Medication Sig Start Date End Date Taking? Authorizing Provider  albuterol (VENTOLIN HFA) 108 (90 Base) MCG/ACT inhaler Inhale 1-2 puffs into the lungs every 6 (six) hours as needed for wheezing or shortness of breath.    [provider]  busPIRone (BUSPAR) 7.5 MG tablet Take 1 tablet by mouth 2 (two) times daily.    [provider]  Calcium Carb-Cholecalciferol (CALCIUM 500/D) 500-400 MG-UNIT CHEW Chew by mouth daily.    [provider]  Cholecalciferol 1.25 MG (50000 UT) TABS Take by mouth once a week.    [provider]  clonazePAM (KLONOPIN) 0.5 MG tablet Take 0.125 mg by mouth 2 (two) times daily.    [provider]  cyanocobalamin 500 MCG tablet Take 500 mcg by mouth daily.    [provider]  diclofenac sodium (VOLTAREN) 1 % GEL Apply 2 g topically 3 (three) times daily. Apply to the fingers    [provider]  escitalopram (LEXAPRO) 10 MG  tablet Take 1 tablet (10 mg total) by mouth every morning. Patient taking differently: Take 20 mg by mouth every morning.  09/11/16   Rainey Pines, MD  fluticasone (VERAMYST) 27.5 MCG/SPRAY nasal spray Place 2 sprays into the nose 2 (two) times daily.    [provider]  folic acid (FOLVITE) 1 MG tablet Take 1 mg by mouth daily.    [provider]  furosemide (LASIX) 40 MG tablet Take 40 mg by mouth 2 (two) times daily.    [provider]  Ipratropium-Albuterol (COMBIVENT RESPIMAT) 20-100 MCG/ACT AERS respimat Inhale 1 puff into the lungs every 6 (six) hours.    [provider]  lamoTRIgine (LAMICTAL) 100 MG tablet Take 1 tablet (100 mg total) by mouth daily. 06/02/16   Rainey Pines, MD  loperamide (IMODIUM A-D) 2 MG tablet Take 1 tablet (2 mg total) by mouth 4 (four) times daily as needed for diarrhea or loose stools. Patient taking differently: Take 2 mg by mouth as needed for diarrhea or loose stools.  05/10/17    Eula Listen, MD  loratadine (CLARITIN) 10 MG tablet Take 10 mg by mouth daily.    [provider]  methotrexate (RHEUMATREX) 7.5 MG tablet Take 7.5 mg by mouth once a week. Caution" Chemotherapy. Protect from light.    [provider]  mirtazapine (REMERON) 7.5 MG tablet Take 7.5 mg by mouth at bedtime.     [provider]  omeprazole (PRILOSEC) 20 MG capsule Take 20 mg by mouth daily.     [provider]  oxyCODONE-acetaminophen (PERCOCET/ROXICET) 5-325 MG tablet Take 1 tablet by mouth every 4 (four) hours as needed for moderate pain or severe pain. 10/24/18   Charlynne Cousins, MD  oxyCODONE-acetaminophen (PERCOCET/ROXICET) 5-325 MG tablet Take 1 tablet by mouth 2 (two) times a day. scheduled    [provider]  Potassium Chloride ER 20 MEQ TBCR Take 20 mEq by mouth 2 (two) times daily. 05/07/17   Vaughan Basta, MD  pramipexole (MIRAPEX) 0.25 MG tablet Take 0.25 mg by mouth daily.     [provider]  saxagliptin HCl (ONGLYZA) 5 MG TABS tablet Take 5 mg by mouth at bedtime.    [provider]  Tofacitinib Citrate 5 MG TABS Take 5 mg by mouth 2 (two) times daily after a meal.     [provider]  topiramate (TOPAMAX) 25 MG tablet Take 25 mg by mouth at bedtime.     [provider]    Allergies Amoxicillin, Gabapentin, Iodine, Naproxen, Nsaids, and Tolmetin    Social History Social History   Tobacco Use   Smoking status: Never   Smokeless tobacco: Never  Vaping Use   Vaping Use: Never used  Substance Use Topics   Alcohol use: No    Alcohol/week: 0.0 standard drinks   Drug use: No    Review of Systems Patient denies headaches, rhinorrhea, blurry vision, numbness, shortness of breath, chest pain, edema, cough, abdominal pain, nausea, vomiting, diarrhea, dysuria, fevers, rashes or hallucinations unless otherwise stated above in  HPI. ____________________________________________   PHYSICAL EXAM:  VITAL SIGNS: Vitals:   05/24/21 1654  BP: 131/82  Pulse: 95  Resp: 18  Temp: 98.4 F (36.9 C)  SpO2: 92%    Constitutional: Alert and oriented. Well appearing and in no acute distress. Eyes: Conjunctivae are normal.  Head: Atraumatic. Nose: No congestion/rhinnorhea. Mouth/Throat: Mucous membranes are moist.   Neck: Painless ROM.  Cardiovascular:   Good peripheral circulation. Respiratory: Normal  respiratory effort.  No retractions.  Gastrointestinal: Soft and nontender.  Musculoskeletal: No lower extremity tenderness .  No joint effusions. Neurologic:  Normal speech and language. No gross focal neurologic deficits are appreciated.  Skin:  Skin is warm, dry and intact. No rash noted. Psychiatric: Mood and affect are normal. Speech and behavior are normal.  ____________________________________________   LABS (all labs ordered are listed, but only abnormal results are displayed)  No results found for this or any previous visit (from the past 24 hour(s)). ____________________________________________  EKG____________________________________________  RADIOLOGY  I personally reviewed all radiographic images ordered to evaluate for the above acute complaints and reviewed radiology reports and findings.  These findings were personally discussed with the patient.  Please see medical record for radiology report.  ____________________________________________   PROCEDURES  Procedure(s) performed:  Procedures    Critical Care performed: no ____________________________________________   INITIAL IMPRESSION / ASSESSMENT AND PLAN / ED COURSE  Pertinent labs & imaging results that were available during my care of the patient were reviewed by me and considered in my medical decision making (see chart for details).   DDX: Fracture, contusion, dislocation, musculoskeletal strain, IPH, SDH, concussion  Meghan [name] is a 75 y.o. who presents to the ED with presentation as described above.  Patient nontoxic-appearing.  X-rays ordered for above pain and discomfort CT imaging ordered for minor head injury given her age.  She denies any neck pain.  CT imaging and radiographs are reassuring.  She has no other complaints at this time pain improved after Tylenol.  Do believe she stable and appropriate for outpatient follow-up.  Patient agreeable to plan.    The patient was evaluated in Emergency Department today for the symptoms described in the history of present illness. He/she was evaluated in the context of the global COVID-19 pandemic, which necessitated consideration that the patient might be at risk for infection with the SARS-CoV-2 virus that causes COVID-19. Institutional protocols and algorithms that pertain to the evaluation of patients at risk for COVID-19 are in a state of rapid change based on information released by regulatory bodies including the CDC and federal and state organizations. These policies and algorithms were followed during the patient's care in the ED.   ____________________________________________   FINAL CLINICAL IMPRESSION(S) / ED DIAGNOSES  Final diagnoses:  Minor head injury, initial encounter  Hip pain      NEW MEDICATIONS STARTED DURING THIS VISIT:  New Prescriptions   No medications on file     Note:  This document was prepared using Dragon voice recognition software and may include unintentional dictation errors.     Merlyn Lot, MD 05/24/21 [phone]

## 2021-06-11 ENCOUNTER — Non-Acute Institutional Stay: Payer: Medicare Other | Admitting: Nurse Practitioner

## 2021-06-11 ENCOUNTER — Other Ambulatory Visit: Payer: Self-pay

## 2021-06-11 ENCOUNTER — Encounter: Payer: Self-pay | Admitting: Nurse Practitioner

## 2021-06-11 VITALS — BP 117/77 | HR 97 | Temp 97.6°F | Resp 8 | Wt 150.8 lb

## 2021-06-11 DIAGNOSIS — R0602 Shortness of breath: Secondary | ICD-10-CM

## 2021-06-11 DIAGNOSIS — Z515 Encounter for palliative care: Secondary | ICD-10-CM

## 2021-06-11 DIAGNOSIS — J449 Chronic obstructive pulmonary disease, unspecified: Secondary | ICD-10-CM

## 2021-06-11 DIAGNOSIS — K529 Noninfective gastroenteritis and colitis, unspecified: Secondary | ICD-10-CM

## 2021-06-11 NOTE — Progress Notes (Addendum)
[facility] Consult Note Telephone: [phone]  Fax: [phone]    Date of encounter: 06/11/21 4:03 PM PATIENT NAME: [place] [address]   [phone] (home)  DOB: 06-14-1946 MRN: [id number] PRIMARY CARE PROVIDER:   Dr [facility]is [facility] RESPONSIBLE PARTY:    Contact Information     Name Relation Home Work [place], [place] Relative   [phone]   Azzie Glatter   [place], [place] Daughter   [phone]      I met face to face with patient in facility. Palliative Care was asked to follow this patient by consultation request of  Dr Nicole Kindred to address advance care planning and complex medical decision making. This is a follow up visit.                                  ASSESSMENT AND PLAN / RECOMMENDATIONS:  Symptom Management/Plan: 1. ACP: Medical goals consists DNR, do not intubate. Wishes are for more conservative care to include antibiotics, IV fluids, blood transfusions, diagnostic testing, lab testing. Wishes are to minimize hospitalizations if necessary.    2. Shortness of breath secondary to congestive heart failure/COPD Continue daily weights and outpatient Cardiology appointments, discussed nebulizers with inhalation therapy.   3. Palliative care encounter; Palliative medicine team will continue to support patient, patient's family, and medical team. Visit consisted of counseling and education dealing with the complex and emotionally intense issues of symptom management and palliative   4. Chronic diarrhea, continue to encourage hydration with caution CHF, continue current regimen. Will f/u about GI f/u visit.    5. F/u 2 months for ongoing monitoring disease progression, chronic nausea, debility.    Follow up Palliative Care Visit: Palliative care will continue to follow for complex medical decision making, advance care planning, and  clarification of goals. Return 8 weeks or prn.  I spent 46 minutes providing this consultation started at 1:15pm. More than 50% of the time in this consultation was spent in counseling and care coordination. PPS: 50%  Chief Complaint: Follow up Palliative visit for complex medical decision making  HISTORY OF PRESENT ILLNESS:  JAVIONNA LEDER is a 75 y.o. year old female  with multiple medical problems including COPD, congestive heart failure with ef 20%, asthma, collagen vascular disease, coronary artery disease, heart murmur, benign brain tumor, hypertension, gerd, hypercholesterolemia, history of headache, diabetes, gerd, arthritis, osteoporosis, lumbar degenerative disc disease, anxiety, PTSD, depression, kyphoplasty, right hand reconstruction 2011, cholecystectomy, cervical fusion, abdominal hysterectomy. Ms Durfey continues to reside in [place] at [facility] center. Ms Gong[place] is able to get up out of bed ambulate short distances in her room. Ms Fujikawa does require assistance to perform adls as she is difficulty with her hands. Ms Osment does feed herself in appetite varies depending on how she is feeling. Staff endorses that she does continue to have episodes of intermittent diarrhea. No other concerns. At present Ms. Curl is ambulating in her room, putting clothes away in her closet. Ms. Daily appears disabled, chronically ill. Ambulating appears to be getting more difficult for [name]. We talked about purpose of pc visit. We talked about how she has been feeling. Ms. Weight endorses she continues to have intermit diarrhea, not much taste for food. We talked about nutrition, her daily routine, using her computer tablet. We talked  about symptoms of pain, shortness of breath, edema. We talked about chronic disease progression. We talked about medical goals. Will f/u with [facility] NP to see about a f/u GI consult for symptoms chronic intermit diarrhea with intermit nausea.  Ms. Mccammon in agreement. We talked about residing at facility. We talked about role pc in poc. Therapeutic listening, emotional support provided. Questions answered, updated staff.  History obtained from review of EMR, discussion with facility staff and [name].  I reviewed available labs, medications, imaging, studies and related documents from the EMR.  Records reviewed and summarized above.   ROS Full 10 system review of systems performed and negative with exception of: as per HPI.   Physical Exam: Constitutional: NAD General: frail appearing, chronically ill pleasant female EYES: lids intact ENMT:oral mucous membranes moist CV: S1S2, RRR Pulmonary: LCTA, no increased work of breathing, no cough, room air Abdomen:  normo-active BS + 4 quadrants, soft and non tender MSK: ambulatory with walker Skin: warm and dry Neuro:  + generalized weakness,  no cognitive impairment Psych: non-anxious affect, A and O x 3  Questions and concerns were addressed. Provided general support and encouragement, no other unmet needs identified   Thank you for the opportunity to participate in the care of [name].  The palliative care team will continue to follow. Please call our office at [phone] if we can be of additional assistance.   This chart was dictated using voice recognition software.  Despite best efforts to proofread,  errors can occur which can change the documentation meaning.    Ihor Gully, NP

## 2021-07-21 ENCOUNTER — Ambulatory Visit (INDEPENDENT_AMBULATORY_CARE_PROVIDER_SITE_OTHER): Payer: Medicare Other

## 2021-07-21 DIAGNOSIS — I442 Atrioventricular block, complete: Secondary | ICD-10-CM

## 2021-07-21 LAB — CUP PACEART REMOTE DEVICE CHECK
Battery Remaining Longevity: 75 mo
Battery Remaining Percentage: 71 %
Battery Voltage: 2.99 V
Brady Statistic AP VP Percent: 1 %
Brady Statistic AP VS Percent: 1 %
Brady Statistic AS VP Percent: 99 %
Brady Statistic AS VS Percent: 1 %
Brady Statistic RA Percent Paced: 1 %
Brady Statistic RV Percent Paced: 99 %
Date Time Interrogation Session: 20221212020013
Implantable Lead Implant Date: 20200313
Implantable Lead Implant Date: 20200313
Implantable Lead Location: 753859
Implantable Lead Location: 753860
Implantable Lead Model: 5076
Implantable Lead Model: 5076
Implantable Pulse Generator Implant Date: 20200313
Lead Channel Impedance Value: 400 Ohm
Lead Channel Impedance Value: 490 Ohm
Lead Channel Pacing Threshold Amplitude: 0.5 V
Lead Channel Pacing Threshold Amplitude: 0.75 V
Lead Channel Pacing Threshold Pulse Width: 0.5 ms
Lead Channel Pacing Threshold Pulse Width: 0.5 ms
Lead Channel Sensing Intrinsic Amplitude: 11.3 mV
Lead Channel Sensing Intrinsic Amplitude: 2.8 mV
Lead Channel Setting Pacing Amplitude: 2 V
Lead Channel Setting Pacing Amplitude: 2.5 V
Lead Channel Setting Pacing Pulse Width: 0.5 ms
Lead Channel Setting Sensing Sensitivity: 4 mV
Pulse Gen Model: 2272
Pulse Gen Serial Number: 9118930

## 2021-07-29 NOTE — Progress Notes (Signed)
Remote pacemaker transmission.   

## 2021-09-08 ENCOUNTER — Encounter: Payer: Self-pay | Admitting: Nurse Practitioner

## 2021-09-08 ENCOUNTER — Non-Acute Institutional Stay: Payer: Medicare Other | Admitting: Nurse Practitioner

## 2021-09-08 ENCOUNTER — Other Ambulatory Visit: Payer: Self-pay

## 2021-09-08 VITALS — BP 113/61 | HR 97 | Temp 97.8°F | Resp 18 | Wt 145.3 lb

## 2021-09-08 DIAGNOSIS — R110 Nausea: Secondary | ICD-10-CM

## 2021-09-08 DIAGNOSIS — Z515 Encounter for palliative care: Secondary | ICD-10-CM

## 2021-09-08 DIAGNOSIS — I509 Heart failure, unspecified: Secondary | ICD-10-CM

## 2021-09-08 DIAGNOSIS — R0602 Shortness of breath: Secondary | ICD-10-CM

## 2021-09-08 NOTE — Progress Notes (Signed)
[facility] Consult Note Telephone: [phone]  Fax: [phone]    Date of encounter: 09/08/21 5:00 PM PATIENT NAME: [place] [address]   [phone] (home)  DOB: 1946/05/27 MRN: [id number] PRIMARY CARE PROVIDER:    [place]:    Contact Information     Name Relation Home Work [place], [place] Relative   [phone]   Azzie Glatter   [phone]   [place] Leep Daughter   [phone]      I met face to face with patient and family in facility. Palliative Care was asked to follow this patient by consultation request of  [place] to address advance care planning and complex medical decision making. This is a follow up visit.                                  ASSESSMENT AND PLAN / RECOMMENDATIONS:  Symptom Management/Plan: 1. ACP: Medical goals consists DNR, do not intubate. Wishes are for more conservative care to include antibiotics, IV fluids, blood transfusions, diagnostic testing, lab testing. Wishes are to minimize hospitalizations if necessary.    2. Shortness of breath secondary to congestive heart failure/COPD Continue daily weights and outpatient Cardiology appointments, discussed nebulizers with inhalation therapy.   3. Palliative care encounter; Palliative medicine team will continue to support patient, patient's family, and medical team. Visit consisted of counseling and education dealing with the complex and emotionally intense issues of symptom management and palliative    4. Chronic nausea with diarrhea, continue to encourage hydration with caution CHF, continue current regimen. Will f/u about GI f/u visit.    5. F/u 2 months for ongoing monitoring disease progression, chronic nausea, debility.  Follow up Palliative Care Visit: Palliative care will continue to follow for complex medical decision making, advance  care planning, and clarification of goals. Return 8 weeks or prn.  I spent 66 minutes providing this consultation. More than 50% of the time in this consultation was spent in counseling and care coordination.  PPS: 50%  Chief Complaint: Follow up palliative consult for complex medical decision making  HISTORY OF PRESENT ILLNESS:  JAVEAH LOEZA is a 76 y.o. year old female  with multiple medical problems including COPD, congestive heart failure with ef 20%, asthma, collagen vascular disease, coronary artery disease, heart murmur, benign brain tumor, hypertension, gerd, hypercholesterolemia, history of headache, diabetes, gerd, arthritis, osteoporosis, lumbar degenerative disc disease, anxiety, PTSD, depression, kyphoplasty, right hand reconstruction 2011, cholecystectomy, cervical fusion, abdominal hysterectomy. Ms Krausz continues to reside in [place] at [facility] center. Ms Ventress is able to get up out of bed ambulate short distances in her room with a walker. Ms Agrawal does require assistance to perform adls as she is difficulty with her hands. Ms Villeda does feed herself in appetite varies depending on how she is feeling with chronic nausea with intermit diarrhea. At present Ms. Prisk is lying in bed, sleeping. Ms. Glas awoke with verbal cues. Ms. Ytuarte appears disabled, chronically ill, comfortable. We talked about purpose of pc visit. We talked about how she has been feeling. Ms. Flicker endorses she continues to have intermit nausea with diarrhea, not much taste for food at times. Other times food tastes "okay". We talked about nutrition. [name] endorses she has not been seen as of yet by  GI. Ms. Quinton and I talked about what a GI workup would look like. Ms. Duchemin endorses she would like to see GI. Will f/u with primary. We talked about symptoms of pain, ros, shortness of breath, edema. We talked about chronic disease progression. We talked about medical goals.  Will f/u with [facility] NP to see about a f/u GI consult for symptoms chronic intermit diarrhea with intermit nausea. We talked about daily routine, residing at [facility] facility, quality of life. Medical goals reviewed. Ms. Soltero in agreement. We talked about residing at facility. We talked about role pc in poc. Therapeutic listening, emotional support provided. Questions answered, updated staff.   History obtained from review of EMR, discussion with facility staff and [name].  I reviewed available labs, medications, imaging, studies and related documents from the EMR.  Records reviewed and summarized above.   ROS 10 point system reviewed with [name] all negative except HPI  Physical Exam Constitutional: NAD General: pleasant female EYES: lids intact ENMT: oral mucous membranes moist CV: S1S2, RRR Pulmonary: LCTA, no increased work of breathing, no cough, room air Abdomen:  normo-active BS + 4 quadrants, soft and non tender MSK: ambulatory with walker Skin: warm and dry Neuro:  + generalized weakness,  no cognitive impairment Psych: non-anxious affect, A and O x 3 Questions and concerns were addressed.. Provided general support and encouragement, no other unmet needs identified   Thank you for the opportunity to participate in the care of [name].  The palliative care team will continue to follow. Please call our office at [phone] if we can be of additional assistance.   Sholom Dulude Ihor Gully, NP

## 2021-10-08 ENCOUNTER — Telehealth: Payer: Self-pay

## 2021-10-08 NOTE — Telephone Encounter (Signed)
Scheduled for 12/29/2021 ?

## 2021-10-11 IMAGING — MR MR THORACIC SPINE W/O CM
8 of 11 series · 30 of 48 positions shown · non-contrast
Comparison: None.

CLINICAL DATA: Back pain and compression fracture

EXAM:
MRI THORACIC AND LUMBAR SPINE WITHOUT CONTRAST
TECHNIQUE: Multiplanar and multiecho pulse sequences of the thoracic and lumbar
spine were obtained without intravenous contrast.

[Series 14: T1 · sagittal · 6.0mm · 1.23mm/px · 2 of 9 slices shown (1 of 4)]
[im 1/9]
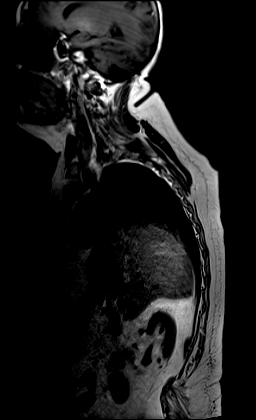
[im 9/9]
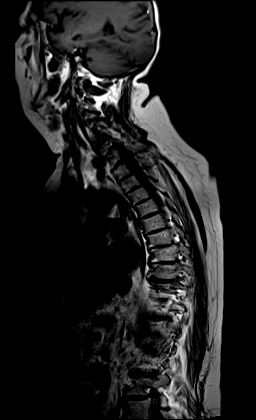

[Series 19: T2 · sagittal · 3.0mm · 0.76mm/px · 3 of 21 slices shown (1 of 4)]
[im 1/21]
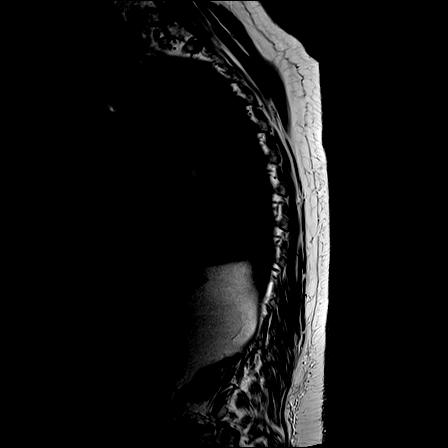
[im 11/21]
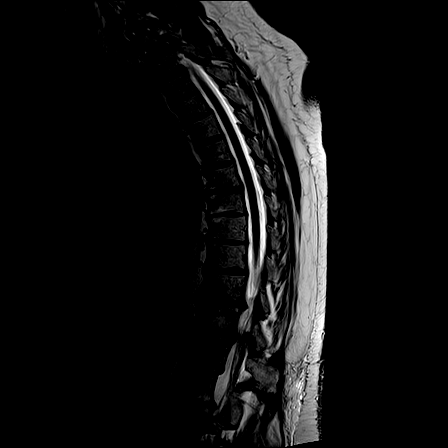
[im 21/21]
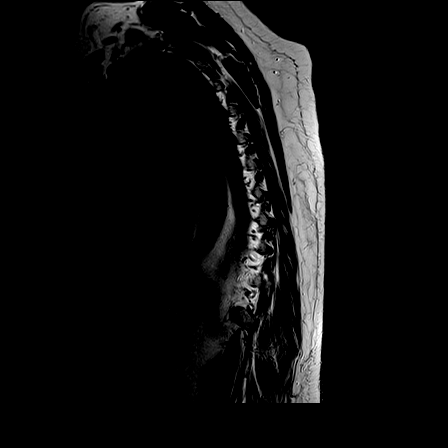

[Series 20: T1 · sagittal · 3.0mm · 0.76mm/px · 3 of 21 slices shown (2 of 4)]
[im 1/21]
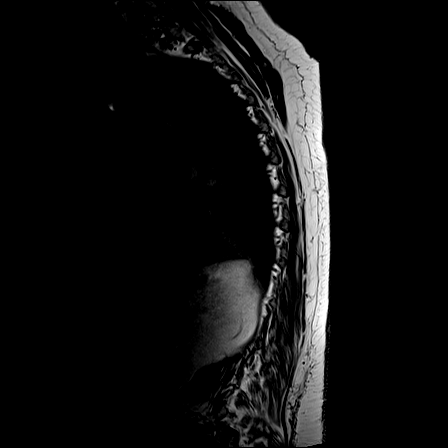
[im 11/21]
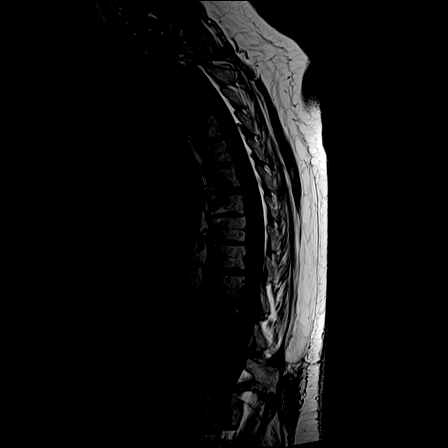
[im 21/21]
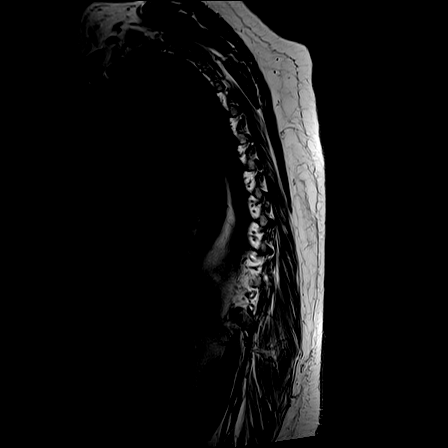

[Series 26: T2 · sagittal · 4.0mm · 0.73mm/px · 3 of 16 slices shown (2 of 4)]
[im 1/16]
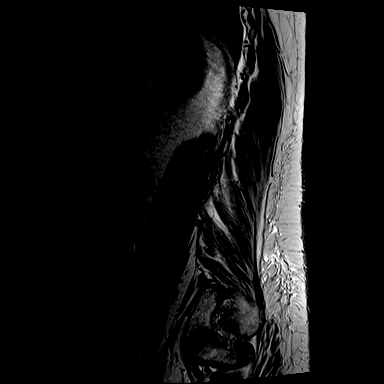
[im 8/16]
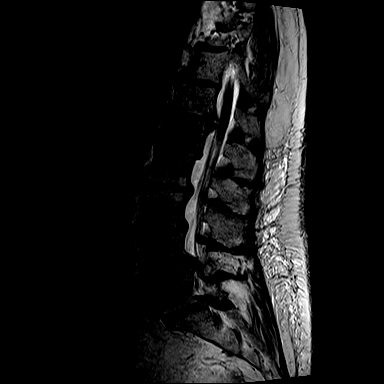
[im 16/16]
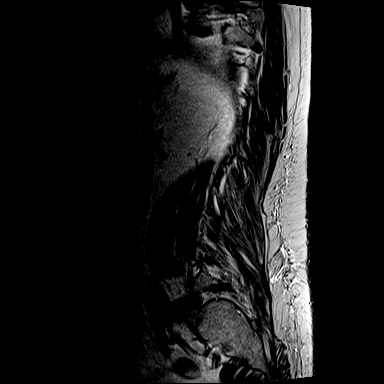

[Series 27: T2 · axial · 4.0mm · 0.59mm/px · z∈[-361,-43]mm · 9 of 55 slices shown (3 of 4)]
[im 1/55]
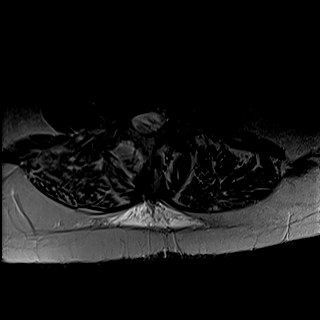
[im 7/55]
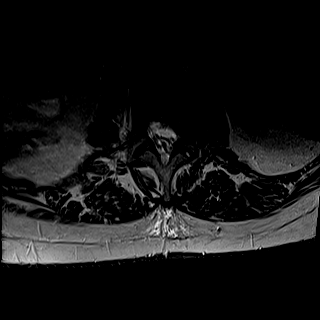
[im 14/55]
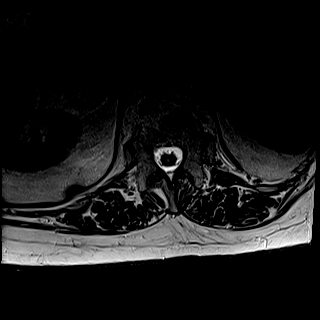
[im 21/55]
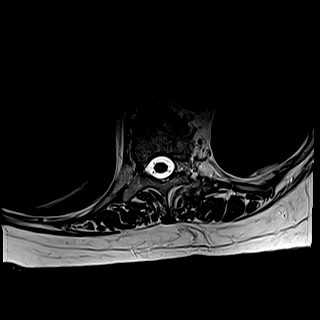
[im 28/55]
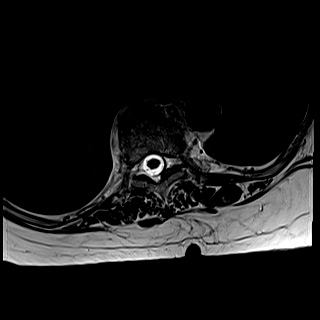
[im 34/55]
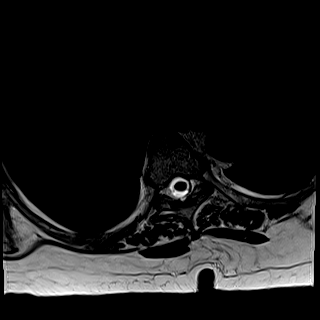
[im 41/55]
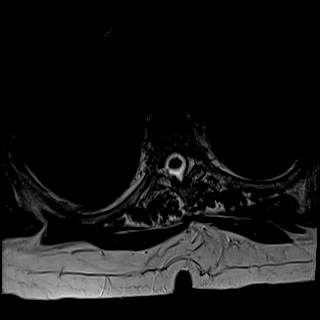
[im 48/55]
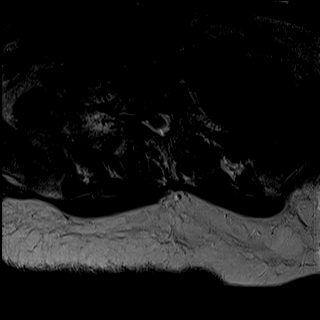
[im 55/55]
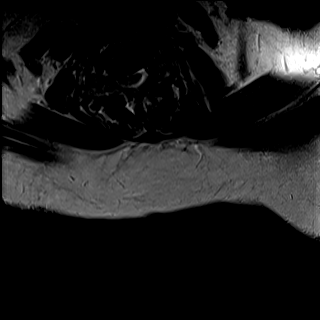

[Series 30: T1 · sagittal · 4.0mm · 0.88mm/px · 3 of 16 slices shown (3 of 4)]
[im 1/16]
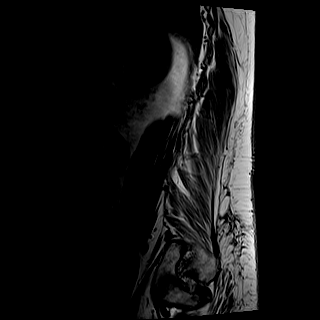
[im 8/16]
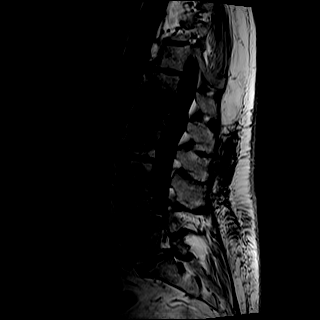
[im 16/16]
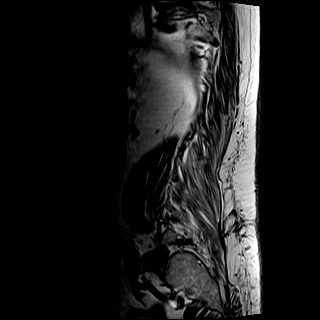

[Series 31: T2 · axial · 4.0mm · 0.57mm/px · z∈[-433,-260]mm · 5 of 30 slices shown (4 of 4)]
[im 1/30]
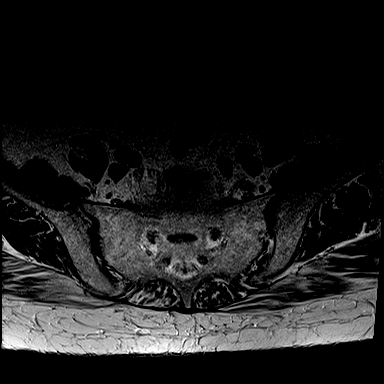
[im 8/30]
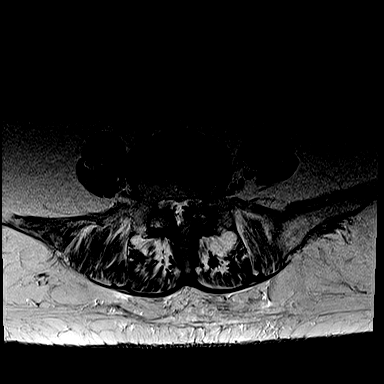
[im 15/30]
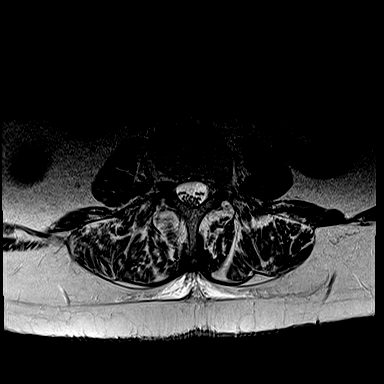
[im 22/30]
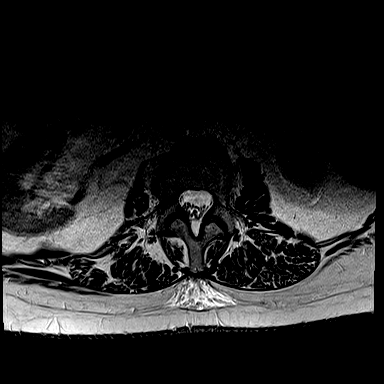
[im 30/30]
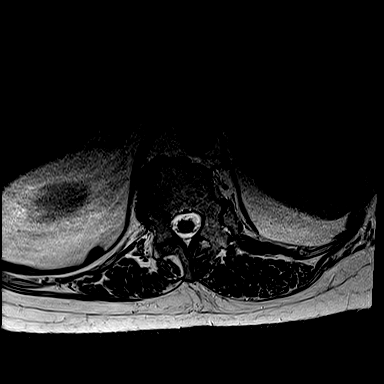

[Series 32: T1 · axial · 4.0mm · 0.34mm/px · z∈[-433,-391]mm · 2 of 30 slices shown (4 of 4)]
[im 1/30]
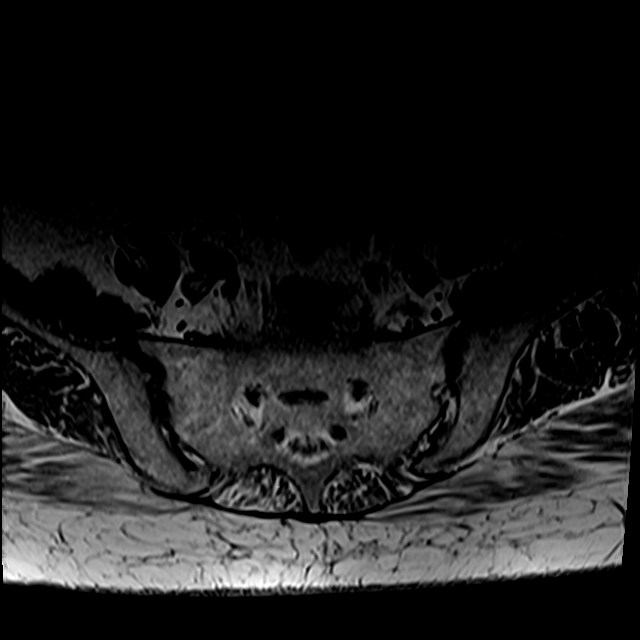
[im 8/30]
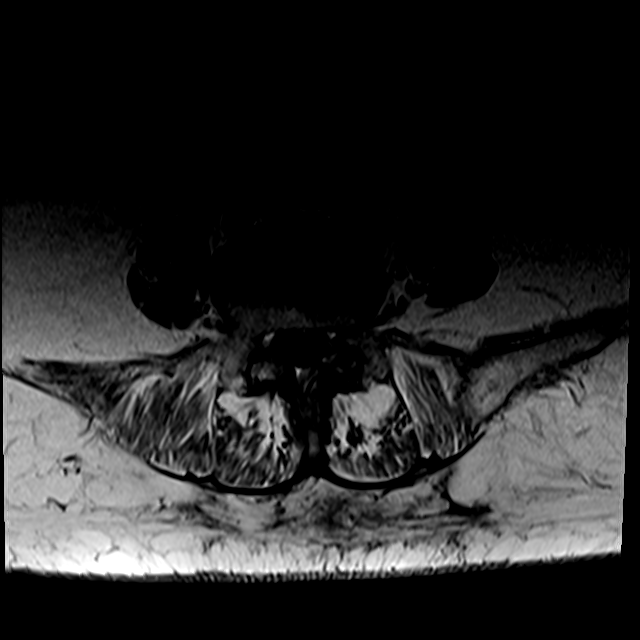

[30 of 48 positions shown; findings below may reference images not displayed]

FINDINGS: MRI THORACIC SPINE FINDINGS

Alignment:  Levoscoliosis with apex at T11

Vertebrae: No fracture, evidence of discitis, or bone lesion.

Cord:  Normal signal and morphology.

Paraspinal and other soft tissues: Negative.

Disc levels:

There is mild generalized degenerative disc disease without spinal
canal stenosis. Disc degeneration is worst at T11-12.

MRI LUMBAR SPINE FINDINGS

Segmentation:  Standard.

Alignment: Grade 1 retrolisthesis at L1-2 and grade 1
anterolisthesis at L3-4, L4-5 and L5-S1.

Vertebrae: There is mild edema at T12-L1. There is approximately 30%
height loss of the L1 vertebral body. Status post vertebral
augmentation at this level.

Conus medullaris and cauda equina: Conus extends to the L2 level.
Conus and cauda equina appear normal.

Paraspinal and other soft tissues: Negative.

Disc levels:

T12-L1: Large central disc protrusion.  Mild spinal canal stenosis.

L1-L2: Mild disc bulge with endplate spurring. There is no spinal
canal stenosis. Moderate bilateral neural foraminal stenosis.

L2-L3: Mild disc bulge. There is no spinal canal stenosis. Moderate
right neural foraminal stenosis.

L3-L4: Right asymmetric disc bulge. There is no spinal canal
stenosis. Moderate right and mild left neural foraminal stenosis.

L4-L5: Severe facet hypertrophy with intermediate disc bulge.
Bilateral lateral recess narrowing with mild central spinal canal
stenosis. Severe bilateral neural foraminal stenosis.

L5-S1: Moderate facet hypertrophy. There is no spinal canal
stenosis. Moderate bilateral neural foraminal stenosis.

Visualized sacrum: Normal.
IMPRESSION: 1. Chronic L1 compression fracture with approximately 30% height
loss and mild edema, status post vertebral augmentation.
2. Mild generalized degenerative disc disease of the thoracic spine
without spinal canal stenosis.
3. Multilevel moderate disc severe lumbar neural foraminal stenosis.
4. Mild lumbar spinal canal stenosis at multiple levels.

## 2021-10-11 IMAGING — MR MR LUMBAR SPINE W/O CM
4 of 5 series · 30 of 48 positions shown · non-contrast
Comparison: None.

CLINICAL DATA: Back pain and compression fracture

EXAM:
MRI THORACIC AND LUMBAR SPINE WITHOUT CONTRAST
TECHNIQUE: Multiplanar and multiecho pulse sequences of the thoracic and lumbar
spine were obtained without intravenous contrast.

[Series 14: T1 · sagittal · 6.0mm · 1.23mm/px · 6 of 9 slices shown (1 of 2)]
[im 1/9]
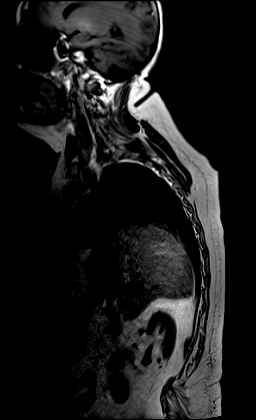
[im 2/9]
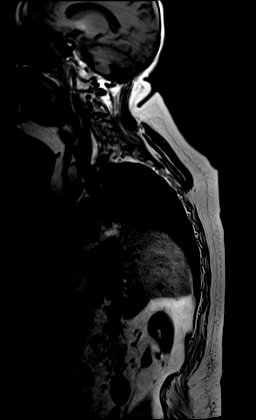
[im 4/9]
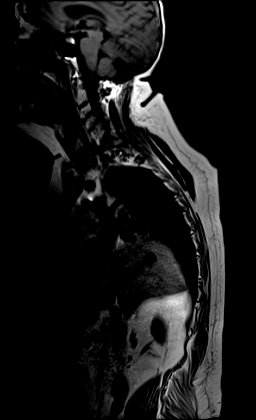
[im 5/9]
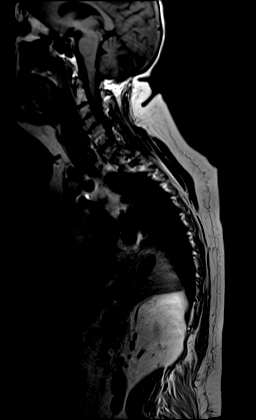
[im 7/9]
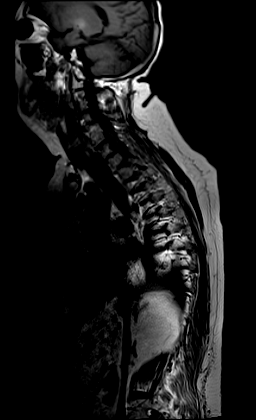
[im 9/9]
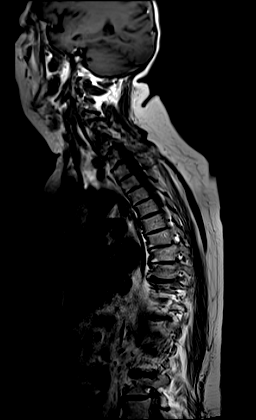

[Series 19: T2 · sagittal · 3.0mm · 0.76mm/px · 11 of 21 slices shown (1 of 2)]
[im 1/21]
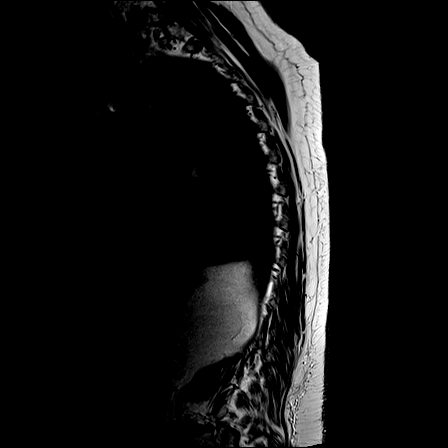
[im 3/21]
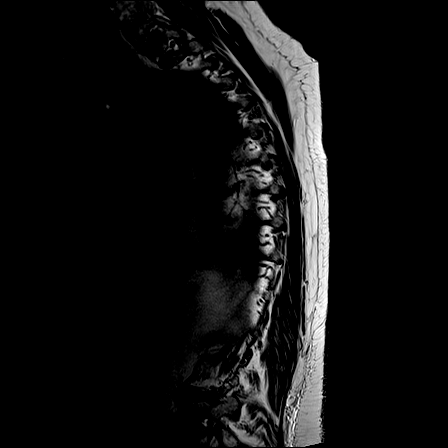
[im 5/21]
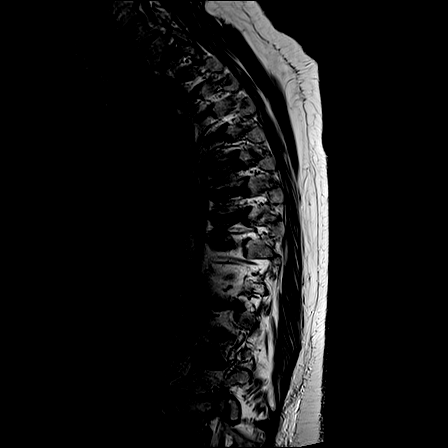
[im 7/21]
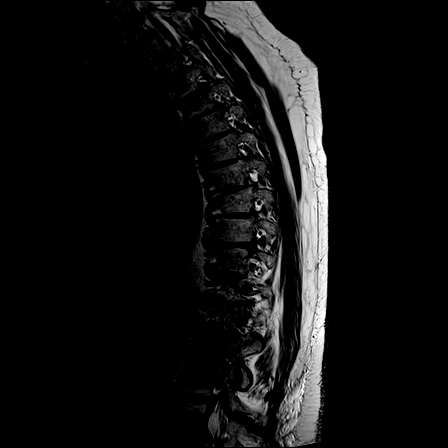
[im 9/21]
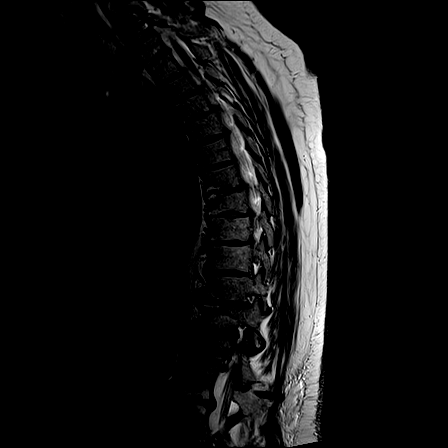
[im 11/21]
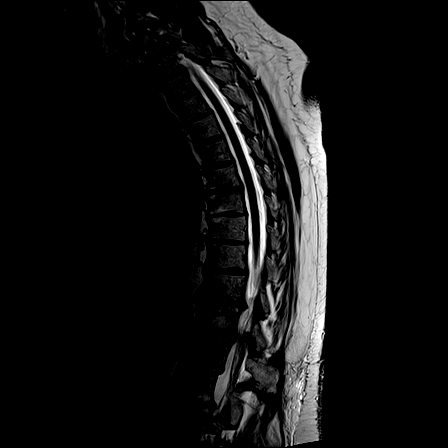
[im 13/21]
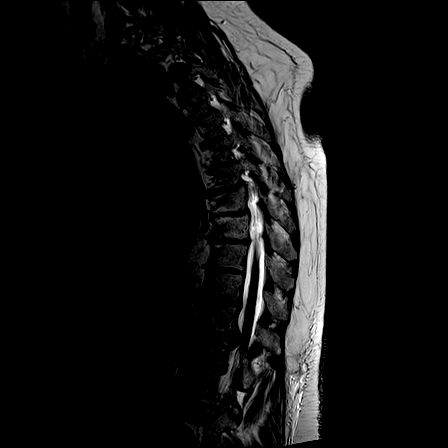
[im 15/21]
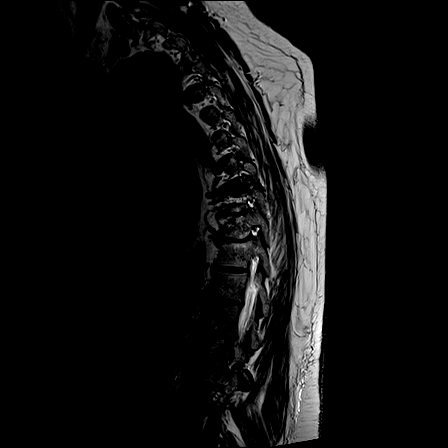
[im 17/21]
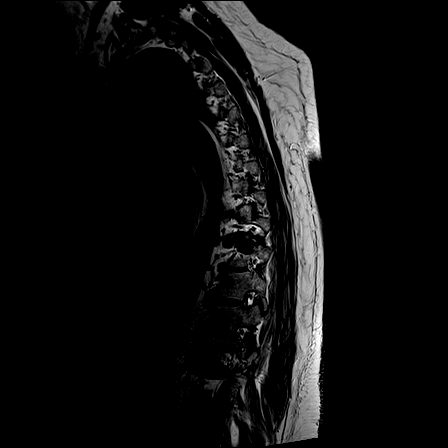
[im 19/21]
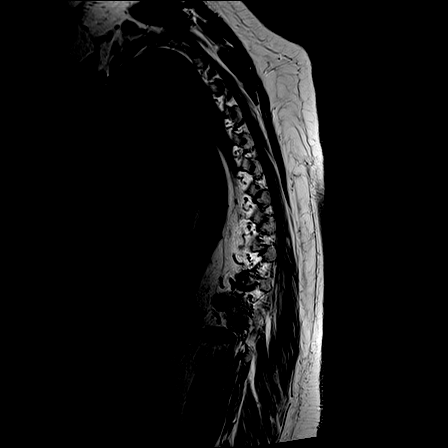
[im 21/21]
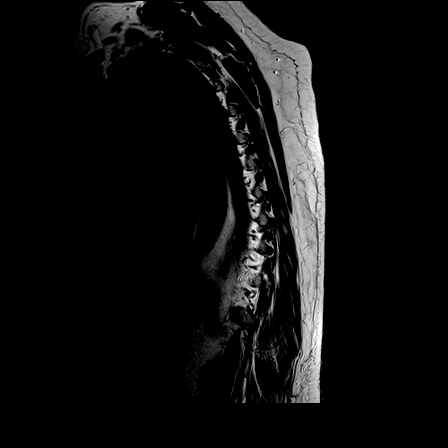

[Series 20: T1 · sagittal · 3.0mm · 0.76mm/px · 4 of 21 slices shown (2 of 2)]
[im 1/21]
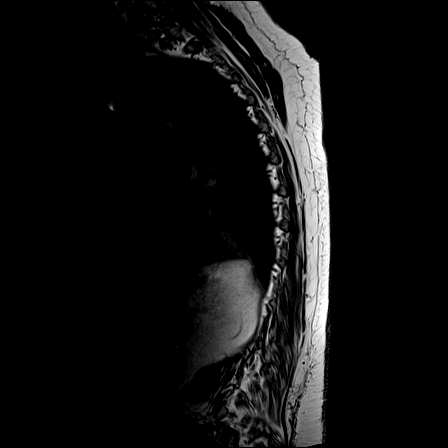
[im 3/21]
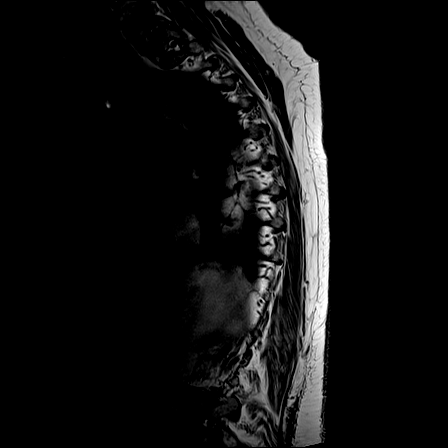
[im 11/21]
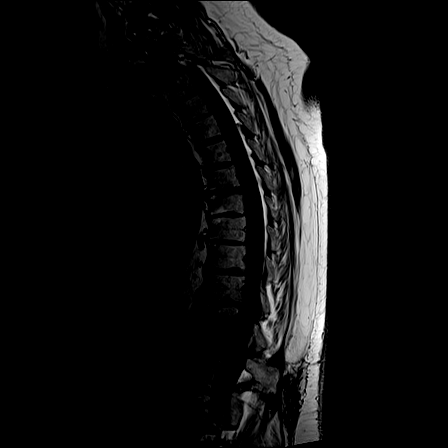
[im 19/21]
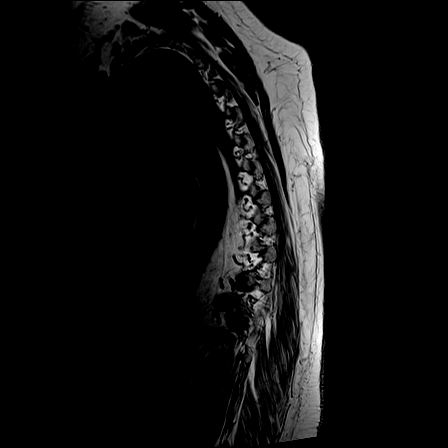

[Series 26: T2 · sagittal · 4.0mm · 0.73mm/px · 9 of 16 slices shown (2 of 2)]
[im 1/16]
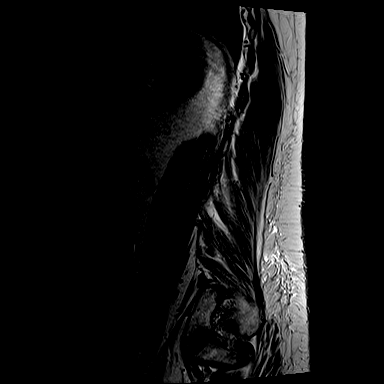
[im 2/16]
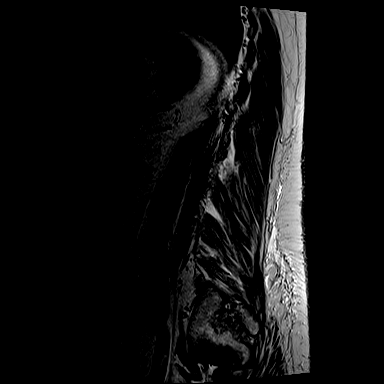
[im 4/16]
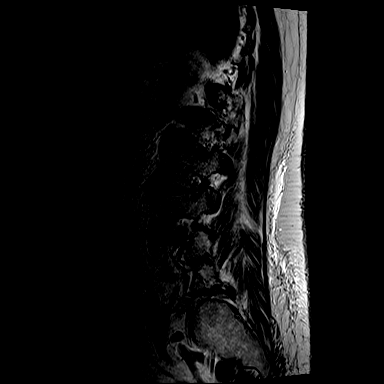
[im 6/16]
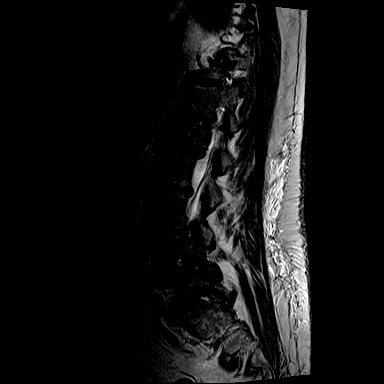
[im 8/16]
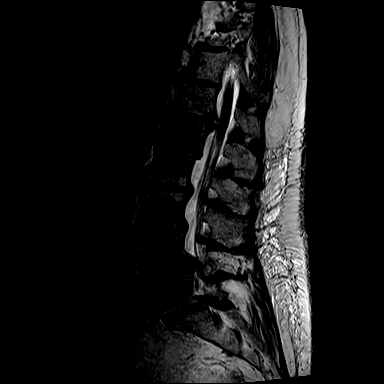
[im 10/16]
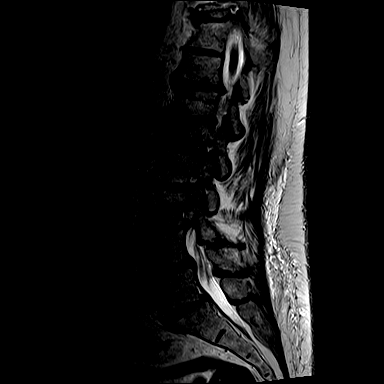
[im 12/16]
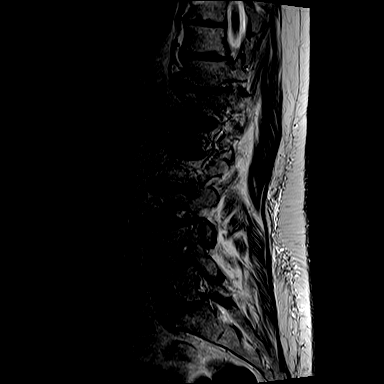
[im 14/16]
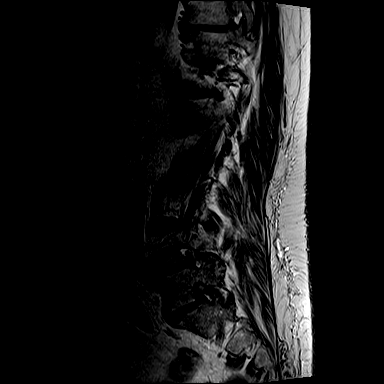
[im 16/16]
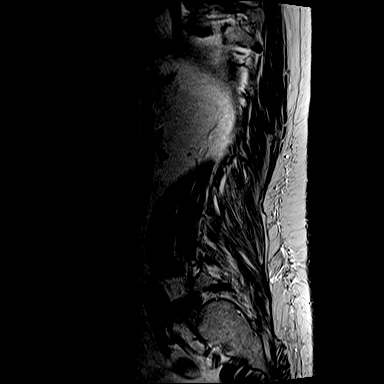

[30 of 48 positions shown; findings below may reference images not displayed]

FINDINGS: MRI THORACIC SPINE FINDINGS

Alignment:  Levoscoliosis with apex at T11

Vertebrae: No fracture, evidence of discitis, or bone lesion.

Cord:  Normal signal and morphology.

Paraspinal and other soft tissues: Negative.

Disc levels:

There is mild generalized degenerative disc disease without spinal
canal stenosis. Disc degeneration is worst at T11-12.

MRI LUMBAR SPINE FINDINGS

Segmentation:  Standard.

Alignment: Grade 1 retrolisthesis at L1-2 and grade 1
anterolisthesis at L3-4, L4-5 and L5-S1.

Vertebrae: There is mild edema at T12-L1. There is approximately 30%
height loss of the L1 vertebral body. Status post vertebral
augmentation at this level.

Conus medullaris and cauda equina: Conus extends to the L2 level.
Conus and cauda equina appear normal.

Paraspinal and other soft tissues: Negative.

Disc levels:

T12-L1: Large central disc protrusion.  Mild spinal canal stenosis.

L1-L2: Mild disc bulge with endplate spurring. There is no spinal
canal stenosis. Moderate bilateral neural foraminal stenosis.

L2-L3: Mild disc bulge. There is no spinal canal stenosis. Moderate
right neural foraminal stenosis.

L3-L4: Right asymmetric disc bulge. There is no spinal canal
stenosis. Moderate right and mild left neural foraminal stenosis.

L4-L5: Severe facet hypertrophy with intermediate disc bulge.
Bilateral lateral recess narrowing with mild central spinal canal
stenosis. Severe bilateral neural foraminal stenosis.

L5-S1: Moderate facet hypertrophy. There is no spinal canal
stenosis. Moderate bilateral neural foraminal stenosis.

Visualized sacrum: Normal.
IMPRESSION: 1. Chronic L1 compression fracture with approximately 30% height
loss and mild edema, status post vertebral augmentation.
2. Mild generalized degenerative disc disease of the thoracic spine
without spinal canal stenosis.
3. Multilevel moderate disc severe lumbar neural foraminal stenosis.
4. Mild lumbar spinal canal stenosis at multiple levels.

## 2021-10-20 ENCOUNTER — Ambulatory Visit (INDEPENDENT_AMBULATORY_CARE_PROVIDER_SITE_OTHER): Payer: Medicare Other

## 2021-10-20 DIAGNOSIS — I442 Atrioventricular block, complete: Secondary | ICD-10-CM | POA: Diagnosis not present

## 2021-10-20 LAB — CUP PACEART REMOTE DEVICE CHECK
Battery Remaining Longevity: 71 mo
Battery Remaining Percentage: 68 %
Battery Voltage: 2.98 V
Brady Statistic AP VP Percent: 1 %
Brady Statistic AP VS Percent: 1 %
Brady Statistic AS VP Percent: 99 %
Brady Statistic AS VS Percent: 1 %
Brady Statistic RA Percent Paced: 1 %
Brady Statistic RV Percent Paced: 99 %
Date Time Interrogation Session: 20230313030015
Implantable Lead Implant Date: 20200313
Implantable Lead Implant Date: 20200313
Implantable Lead Location: 753859
Implantable Lead Location: 753860
Implantable Lead Model: 5076
Implantable Lead Model: 5076
Implantable Pulse Generator Implant Date: 20200313
Lead Channel Impedance Value: 380 Ohm
Lead Channel Impedance Value: 460 Ohm
Lead Channel Pacing Threshold Amplitude: 0.5 V
Lead Channel Pacing Threshold Amplitude: 0.75 V
Lead Channel Pacing Threshold Pulse Width: 0.5 ms
Lead Channel Pacing Threshold Pulse Width: 0.5 ms
Lead Channel Sensing Intrinsic Amplitude: 10.4 mV
Lead Channel Sensing Intrinsic Amplitude: 2.4 mV
Lead Channel Setting Pacing Amplitude: 2 V
Lead Channel Setting Pacing Amplitude: 2.5 V
Lead Channel Setting Pacing Pulse Width: 0.5 ms
Lead Channel Setting Sensing Sensitivity: 4 mV
Pulse Gen Model: 2272
Pulse Gen Serial Number: 9118930

## 2021-10-27 ENCOUNTER — Encounter: Payer: Self-pay | Admitting: Nurse Practitioner

## 2021-10-27 ENCOUNTER — Non-Acute Institutional Stay: Payer: Medicare Other | Admitting: Nurse Practitioner

## 2021-10-27 ENCOUNTER — Other Ambulatory Visit: Payer: Self-pay

## 2021-10-27 VITALS — BP 130/72 | HR 87 | Temp 98.0°F | Resp 18 | Wt 150.6 lb

## 2021-10-27 DIAGNOSIS — I509 Heart failure, unspecified: Secondary | ICD-10-CM

## 2021-10-27 DIAGNOSIS — R110 Nausea: Secondary | ICD-10-CM

## 2021-10-27 DIAGNOSIS — Z515 Encounter for palliative care: Secondary | ICD-10-CM

## 2021-10-27 DIAGNOSIS — R0602 Shortness of breath: Secondary | ICD-10-CM

## 2021-10-27 NOTE — Progress Notes (Signed)
? ? ?[facility] ?Community Palliative Care Consult Note ?Telephone: [phone]  ?Fax: [phone]  ? ? ?Date of encounter: 10/27/21 ?6:24 PM ?[name] ?[address] ?[place] [address]   ?[phone] (home)  ?DOB: [birth year] ?MRN: [id number] ?PRIMARY CARE PROVIDER:    ?[employer]  ? ?RESPONSIBLE PARTY:    ?Contact Information   ? ? Name Relation Home Work Mobile  ? Tenna Child Relative   [phone]  ? Maia Plan Granddaughter   [phone]  ? Dereck Leep Daughter   [phone]  ? ?  ? ?I met face to face with patient in facility. Palliative Care was asked to follow this patient by consultation request of  [place] to address advance care planning and complex medical decision making. This is a follow up visit.                                  ?ASSESSMENT AND PLAN / RECOMMENDATIONS:  ?Symptom Management/Plan: ?1. ACP: Medical goals consists DNR, do not intubate. Wishes are for more conservative care to include antibiotics, IV fluids, blood transfusions, diagnostic testing, lab testing. Wishes are to minimize hospitalizations if necessary. ?  ? 2. Shortness of breath stable secondary to congestive heart failure/COPD Continue daily weights and outpatient Cardiology appointments, discussed nebulizers with inhalation therapy. ?  ?3. Palliative care encounter; Palliative medicine team will continue to support patient, patient's family, and medical team. Visit consisted of counseling and education dealing with the complex and emotionally intense issues of symptom management and palliative  ?  ?4. Chronic nausea with diarrhea, continue to encourage hydration with caution CHF, continue current regimen. Will f/u about GI f/u visit.  ? ?10/23/2021 sodium 145; potassium 3.6; chloride 108; Co2 28; calcium 9.4; bun 17.1; creatinine 0.74; glucose 131; albumin 4.0; total protein 6.3 ?  ?5. F/u 2 months for ongoing monitoring disease progression,  chronic nausea, debility.  ? ?Follow up Palliative Care Visit: Palliative care will continue to follow for complex medical decision making, advance care planning, and clarification of goals. Return 8 weeks or prn. ? ?I spent 40 minutes providing this consultation starting at 1:00 pm. More than 50% of the time in this consultation was spent in counseling and care coordination ? ?PPS: 50% ? ?Chief Complaint: Follow up palliative consult for complex medical decision making ? ?HISTORY OF PRESENT ILLNESS:  Meghan [name] is a 76 y.o. year old female  with multiple medical problems including COPD, congestive heart failure with ef 20%, asthma, collagen vascular disease, coronary artery disease, heart murmur, benign brain tumor, hypertension, gerd, hypercholesterolemia, history of headache, diabetes, gerd, arthritis, osteoporosis, lumbar degenerative disc disease, anxiety, PTSD, depression, kyphoplasty, right hand reconstruction 2011, cholecystectomy, cervical fusion, abdominal hysterectomy. Meghan [name] continues to reside in [place] at [facility] center. Meghan [name] is able to get up out of bed ambulate short distances in her room with a walker. Meghan [name] does require assistance to perform adls as she is difficulty with her hands. Meghan [name] does feed herself in appetite varies depending on how she is feeling with chronic nausea with intermit diarrhea. At present Meghan. [name] is sitting in the w/c in her room. Meghan. [name] appears disabled, chronically ill, comfortable. We talked about purpose of pc visit. We talked about how she has been feeling. Meghan. [name] endorses she continues to have intermit nausea with diarrhea, but has not  as of yet seen GI. We talked about appetite, weight, nutrition. Will f/u with primary. We talked about symptoms of pain, ros, shortness of breath, edema. We talked about chronic disease progression. We talked about medical goals. We talked about daily routine, residing at  [facility] facility, quality of life. Medical goals reviewed. Meghan. Lindell in agreement. We talked about residing at facility. We talked about role pc in poc. Therapeutic listening, emotional support provided. Questions answered, updated staff. Most PC visit supportive, Meghan. Alper was appreciative of visit.  ? ?History obtained from review of EMR, discussion with facility staff and  Meghan. Bennett [name].  ?I reviewed available labs, medications, imaging, studies and related documents from the EMR.  Records reviewed and summarized above.  ? ?ROS ?10 point system reviewed with Meghan [name] and staff all negative except HPI ? ?Physical Exam: ?Constitutional: NAD ?General: obese, pleasant debilitated female ?EYES: lids intact ?ENMT: oral mucous membranes moist ?CV: S1S2, RRR ?Pulmonary: LCTA, no increased work of breathing, no cough, room air ?Abdomen: normo-active BS + 4 quadrants, soft and non tender ?MSK: steps in room; w/c dependent ?Skin: warm and dry ?Neuro:  + generalized weakness,  no cognitive impairment ?Psych: non-anxious affect, A and O x 3 ?Thank you for the opportunity to participate in the care of Meghan. Bennett [name].  The palliative care team will continue to follow. Please call our office at [phone] if we can be of additional assistance.  ? ?Lynett Brasil Z Geoge Lawrance, NP  ? ? ?

## 2021-10-31 NOTE — Progress Notes (Signed)
Remote pacemaker transmission.   

## 2021-11-10 NOTE — Progress Notes (Signed)
Patient: Meghan [name]  Service Category: E/M  Provider: Gaspar Cola, MD  ?DOB: 19-Sep-1945  DOS: 11/12/2021  Referring Provider: Demetrius Charity, MD  ?MRN: [id number]  Setting: Ambulatory outpatient  PCP: Garwin Brothers, MD  ?Type: New Patient  Specialty: Interventional Pain Management    ?Location: Office  Delivery: Face-to-face    ? ?Primary Reason(s) for Visit: Encounter for initial evaluation of one or more chronic problems (new to examiner) potentially causing chronic pain, and posing a threat to normal musculoskeletal function. (Level of risk: High) ?CC: Back Pain (lower) ? ?HPI  ?Meghan [name] is a 76 y.o. year old, female patient, who comes for the first time to our practice referred by Demetrius Charity, MD for our initial evaluation of her chronic pain. She has Severe bipolar disorder with psychotic features, mood-congruent ([place]); Neurosis, posttraumatic; H/O gastric ulcer; HTN (hypertension); Sepsis ([place]); Left knee pain; GERD (gastroesophageal reflux disease); COPD (chronic obstructive pulmonary disease) ([place]); Depression; Anxiety; Barton's fracture of distal radius, closed; Cephalalgia; DDD (degenerative disc disease), lumbar; Neuritis or radiculitis due to rupture of lumbar intervertebral disc; BP (high blood pressure); Adiposity; Cardiac murmur; Arthritis, degenerative; Cervico-occipital neuralgia; Cervical spine syndrome; Breath shortness; Disordered sleep; Difficulty in walking; Hypokalemia; Hyponatremia; Asthma with acute exacerbation; Chronic obstructive pulmonary disease ([place]); Type 1 diabetes mellitus ([place]); Degeneration of intervertebral disc of lumbar region; Seropositive rheumatoid arthritis ([place]); Chronic tension-type headache, intractable; Difficulty in walking, not elsewhere classified; Olfactory hallucination; Seizure ([place]); Colitis; Near syncope; Dehydration; Acute on chronic respiratory failure with hypoxia ([place]); Acute respiratory failure with hypoxia ([place]); Pressure injury of skin; Acute on  chronic systolic CHF (congestive heart failure) ([place]); Acute lower UTI; Acute respiratory failure ([place]); Syncope; Palliative care encounter; Weakness generalized; Shortness of breath; Complete heart block ([place]); Closed fracture of distal end of right femur ([place]), periprosthetic; Dyspnea; Chronic low back pain (1ry area of Pain) (Bilateral) (R>L) w/o sciatica; Lumbar stenosis with neurogenic claudication; Lumbar degenerative disc disease; Chronic pain syndrome; Pharmacologic therapy; Disorder of skeletal system; Problems influencing health status; Chronic hip pain (2ry area of Pain) (Bilateral) (R>L); Rheumatoid arthritis involving multiple sites with positive rheumatoid factor ([place]); and Chronic use of opiate for therapeutic purpose on their problem list. Today she comes in for evaluation of her Back Pain (lower) ? ?Pain Assessment: ?Location: Right Back ?Radiating: pain radaities down her right side to her thigh ?Onset: More than a month ago ?Duration: Chronic pain ?Quality: Aching, Burning, Constant, Sharp, Shooting, Stabbing ?Severity: 8 /10 (subjective, self-reported pain score)  ?Effect on ADL: limits my daily activities ?Timing: Constant ?Modifying factors: meds, sitting, heat ?BP: 117/67  HR: 74 ? ?Onset and Duration: Gradual and Present longer than 3 months ?Cause of pain: Unknown ?Severity: Getting worse, NAS-11 at its worse: 10/10, NAS-11 at its best: 5/10, NAS-11 now: 8/10, and NAS-11 on the average: 7/10 ?Timing: Not influenced by the time of the day ?Aggravating Factors: Lifiting, Prolonged standing, Squatting, Stooping , Walking, Walking uphill, and Walking downhill ?Alleviating Factors: Hot packs, Medications, Nerve blocks, Resting, and Relaxation therapy ?Associated Problems: Constipation, Depression, Inability to control bladder (urine), Inability to control bowel, Pain that wakes patient up, and Pain that does not allow patient to sleep ?Quality of Pain: Aching, Constant, Pressure-like, Sharp, and  Throbbing ?Previous Examinations or Tests: The patient denies any previous tests ?Previous Treatments: Epidural steroid injections, Facet blocks, and Narcotic medications ? ?According to the patient the primary area of pain is out of the lower back (Bilateral) (R>L).  The patient denies any  recent x-rays or imaging studies of the lower back.  She also denies any surgeries but does admit to currently being involved in physical therapy which she describes that it does seem to help.  In addition to that, the patient has a history of having had multiple nerve blocks done by the [facility] pain clinic (Dr. Alba Destine and Dr. Sharlet Salina) which she refers do seem to help for approximately 2 weeks or so.  She refers that her low back pain to be worse than her hip pain.  This low back pain today is described to be an 8/10.  The patient also refers that it refers pain down (2ry area of Pain) both of her hips, but worse on the left side going through the buttocks area and the posterior aspect of her thigh down to the knee with no pain going below that level. ? ?Review of interventional therapies performed by other practitioners include: ?Therapeutic right L5-S1 TFESI x1 (11/04/2021) by Girtha Hake, MD  ?Therapeutic bilateral L4-5 TFESI x4 (10/07/2021) by Girtha Hake, MD  ?Therapeutic bilateral L4-5 TFESI x5 (12/27/2019) by Sharlet Salina, DO  ?Therapeutic bilateral L5-S1 TFESI x2 (07/13/2014) by Sharlet Salina, DO  ?Therapeutic right L5-S1 TFESI x1 (05/18/2014) by Sharlet Salina, DO ?Diagnostic left greater occipital NB x1 (01/26/2014) by Ozella Rocks, MD  ?Diagnostic EEG x1 (12/20/2015) by Gurney Maxin, MD ([facility])  ? ?The patient's secondary area pain is that of the hips (Bilateral) (R>L).  The patient indicates not having had any hip surgery or any recent x-rays but she does admit to currently having this physical therapy for the lower back and the hip pain.  When the patient was asked to point out her hip, she  ended up pointing at the posterolateral aspect of the iliac crest and she describes that the pain seems to go from there down through the back and lateral aspect of the upper leg but when no pain, numbness, or weakness below the knee.  This seems to be either articular pain or referred pain from the posterior structures of the lumbar spine.  Although she refers having had injections in the area of the hips by the [facility] physicians, I cannot find any reports of those hip injections.  Reviewing the information, it is likely that she was referring to the bilateral transforaminal epidural steroid injections provided by Dr. Kelli Churn since she indicated that those injections were done by "a female doctor at the [facility] pain clinic". ? ?Pharmacotherapy: The patient indicates currently taking Lyrica 100 mg p.o. twice daily.  She also indicates taking oxycodone 5 mg every 6 hours as needed.  She currently denies any side effects or adverse reactions to the medication, but while I was talking to her, she was falling asleep.  I asked her if this was secondary to the medications or perhaps not having been able to sleep last night.  She indicated that we was a little bit of both.  She complains the medications not to last but approximately 3 to 4 hours.  She refers that because of that she wakes up every night between 2 and 5 AM with pain and has to wait until she is given her morning pills in order to get this under control.  Review of the patient's PMP came back completely negative since she appears to be getting her medicine from her nursing home and they do not seem to be required to notify the [facility] PMP system.  For this reason, I have no  idea who is prescribing her Lyrica or oxycodone. ? ?Physical exam: Elderly lady on a wheelchair, very somnolent and easily falling asleep.  She presents with significant bilateral ulnar deviation of her hand joints secondary to rheumatoid arthritis.  She indicates  having long-term problems with joint pain secondary to her rheumatoid arthritis.  The patient indicates that she is very much dependent on her wheelchair.  She does admit to being able to walk for very s

## 2021-11-12 ENCOUNTER — Encounter: Payer: Self-pay | Admitting: Pain Medicine

## 2021-11-12 ENCOUNTER — Ambulatory Visit: Payer: Medicare Other | Attending: Pain Medicine | Admitting: Pain Medicine

## 2021-11-12 VITALS — BP 117/67 | HR 74 | Temp 97.2°F | Ht 63.0 in | Wt 147.0 lb

## 2021-11-12 DIAGNOSIS — G894 Chronic pain syndrome: Secondary | ICD-10-CM | POA: Insufficient documentation

## 2021-11-12 DIAGNOSIS — Z79891 Long term (current) use of opiate analgesic: Secondary | ICD-10-CM | POA: Diagnosis present

## 2021-11-12 DIAGNOSIS — G8929 Other chronic pain: Secondary | ICD-10-CM | POA: Diagnosis present

## 2021-11-12 DIAGNOSIS — M899 Disorder of bone, unspecified: Secondary | ICD-10-CM | POA: Diagnosis present

## 2021-11-12 DIAGNOSIS — M25552 Pain in left hip: Secondary | ICD-10-CM | POA: Insufficient documentation

## 2021-11-12 DIAGNOSIS — M5136 Other intervertebral disc degeneration, lumbar region: Secondary | ICD-10-CM | POA: Insufficient documentation

## 2021-11-12 DIAGNOSIS — M25551 Pain in right hip: Secondary | ICD-10-CM | POA: Insufficient documentation

## 2021-11-12 DIAGNOSIS — Z789 Other specified health status: Secondary | ICD-10-CM | POA: Insufficient documentation

## 2021-11-12 DIAGNOSIS — M059 Rheumatoid arthritis with rheumatoid factor, unspecified: Secondary | ICD-10-CM | POA: Insufficient documentation

## 2021-11-12 DIAGNOSIS — M0579 Rheumatoid arthritis with rheumatoid factor of multiple sites without organ or systems involvement: Secondary | ICD-10-CM | POA: Diagnosis present

## 2021-11-12 DIAGNOSIS — Z79899 Other long term (current) drug therapy: Secondary | ICD-10-CM | POA: Diagnosis present

## 2021-11-12 DIAGNOSIS — M545 Low back pain, unspecified: Secondary | ICD-10-CM | POA: Diagnosis present

## 2021-11-12 DIAGNOSIS — M51369 Other intervertebral disc degeneration, lumbar region without mention of lumbar back pain or lower extremity pain: Secondary | ICD-10-CM | POA: Insufficient documentation

## 2021-11-12 NOTE — Progress Notes (Signed)
Safety precautions to be maintained throughout the outpatient stay will include: orient to surroundings, keep bed in low position, maintain call bell within reach at all times, provide assistance with transfer out of bed and ambulation.  

## 2021-11-21 ENCOUNTER — Non-Acute Institutional Stay: Payer: Medicare Other | Admitting: Nurse Practitioner

## 2021-11-21 ENCOUNTER — Encounter: Payer: Self-pay | Admitting: Nurse Practitioner

## 2021-11-21 VITALS — BP 131/68 | HR 97 | Temp 97.8°F | Resp 18 | Wt 147.2 lb

## 2021-11-21 DIAGNOSIS — R0602 Shortness of breath: Secondary | ICD-10-CM

## 2021-11-21 DIAGNOSIS — I509 Heart failure, unspecified: Secondary | ICD-10-CM

## 2021-11-21 DIAGNOSIS — R110 Nausea: Secondary | ICD-10-CM

## 2021-11-21 DIAGNOSIS — Z515 Encounter for palliative care: Secondary | ICD-10-CM

## 2021-11-21 NOTE — Progress Notes (Addendum)
? ? ?Manufacturing engineer ?Community Palliative Care Consult Note ?Telephone: [phone]  ?Fax: [phone]  ? ? ?Date of encounter: 11/21/21 ?7:16 PM ?[name] ?[address] ?[place] [address]   ?[phone] (home)  ?DOB: 1946/07/11 ?MRN: [id number] ?PRIMARY CARE PROVIDER:    ?[facility] ? ?RESPONSIBLE PARTY:    ?Contact Information   ? ? Name Relation Home Work Mobile  ? Tenna Child Relative   [phone]  ? Maia Plan Granddaughter   [phone]  ? Dereck Leep Daughter   [phone]  ? ?  ? ?I met face to face with patient in facility. Palliative Care was asked to follow this patient by consultation request of  [facility] to address advance care planning and complex medical decision making. This is a follow up visit.                                  ?ASSESSMENT AND PLAN / RECOMMENDATIONS:  ?Symptom Management/Plan: ?1. ACP: Medical goals consists DNR, do not intubate. Wishes are for more conservative care to include antibiotics, IV fluids, blood transfusions, diagnostic testing, lab testing. Wishes are to minimize hospitalizations if necessary. ?  ? 2. Shortness of breath stable secondary to congestive heart failure/COPD Continue daily weights and outpatient Cardiology appointments, discussed nebulizers with inhalation therapy. ?  ?3. Palliative care encounter; Palliative medicine team will continue to support patient, patient's family, and medical team. Visit consisted of counseling and education dealing with the complex and emotionally intense issues of symptom management and palliative  ?  ?4. Chronic nausea with diarrhea with chronic constipation discussed at length, continue to encourage hydration with caution CHF, continue current regimen. Talked with [employer] provider, discussed concerns. GI consult ordered, remains pending at present time. ?  ?5. F/u 1 months for ongoing monitoring disease progression, chronic nausea, debility.   ? ?Follow up Palliative Care Visit: Palliative care will continue to follow for complex medical decision making, advance care planning, and clarification of goals. Return 4 weeks or prn. ? ?I spent 36 minutes providing this consultation. More than 50% of the time in this consultation was spent in counseling and care coordination. ? ?PPS: 50% ? ?Chief Complaint: Follow up palliative consult for complex medical decision making ? ?HISTORY OF PRESENT ILLNESS:  Meghan [name] is a 76 y.o. year old female  with  multiple medical problems including COPD, congestive heart failure with ef 20%, asthma, collagen vascular disease, coronary artery disease, heart murmur, benign brain tumor, hypertension, gerd, hypercholesterolemia, history of headache, diabetes, gerd, arthritis, osteoporosis, lumbar degenerative disc disease, anxiety, PTSD, depression, kyphoplasty, right hand reconstruction 2011, cholecystectomy, cervical fusion, abdominal hysterectomy. Meghan [name] continues to reside in [place] at [facility] center. Meghan [name] is able to get up out of bed ambulate short distances in her room with a walker. Meghan [name] does require assistance to perform adls as she is difficulty with her hands. Meghan [name] does feed herself in appetite varies depending on how she is feeling with chronic nausea with intermit diarrhea. Meghan Gilkison endorses she has not had a GI appointment as of yet. At present Meghan. [name] is sitting in the w/c peddling into medical records to see [facility] provider, appears comfortable. We talked about purpose of pc visit. We talked about how she has been feeling. Meghan [name] endorses she continues to have intermit nausea with diarrhea, but has not as  of yet seen GI. We talked about appetite, weight, nutrition. Will f/u with primary. We talked about symptoms of pain, ros, shortness of breath, edema. We talked about chronic disease progression. We talked about medical goals. We talked about daily  routine, residing at [facility] [place] facility, quality of life. Medical goals reviewed. Meghan [name] in agreement. We talked about residing at facility. We talked about pain, currently being followed by pain clinic. Meghan [name] endorses the Oxycodone only lasts a few hours for relief. Encouraged Meghan [name] to continue to discuss with pain management her pain concerns, relief she receives and time frame. We talked about psychiatry with psychotherapy which continues to help. We talked about role pc in poc. Therapeutic listening, emotional support provided. Questions answered, updated staff. Most PC visit supportive, Meghan [name] was appreciative of visit. We talked about f/u PC visit and updated staff. ? ?History obtained from review of EMR, discussion with facility staff and Meghan [name].  ?I reviewed available labs, medications, imaging, studies and related documents from the EMR.  Records reviewed and summarized above.  ? ?ROS ?10 point system reviewed all negative except HPI ? ?Physical Exam: ?Constitutional: NAD ?General: chronically ill, pleasant female ?EYES:  lids intact ?ENMT: oral mucous membranes moist ?CV: S1S2, RRR ?Pulmonary: LCTA, no increased work of breathing, no cough, room air ?Abdomen: soft and non tender ?GU: deferred ?MSK: few steps, ambulatory in her room, does use w/c; limited use BLE with contractures from RA ?Skin: warm and dry ?Neuro:  + generalized weakness,  no cognitive impairment ?Psych: non-anxious affect, A and O x 3 ? ?Thank you for the opportunity to participate in the care of Meghan [name].  The palliative care team will continue to follow. Please call our office at [phone] if we can be of additional assistance.  ? ?Aeric Burnham Z Anasofia Micallef, NP  ?   ?

## 2021-11-26 LAB — DRUG SCREEN 10 W/CONF, SERUM
Amphetamines, IA: NEGATIVE ng/mL
Barbiturates, IA: NEGATIVE ug/mL
Benzodiazepines, IA: NEGATIVE ng/mL
Cocaine & Metabolite, IA: NEGATIVE ng/mL
Methadone, IA: NEGATIVE ng/mL
Opiates, IA: NEGATIVE ng/mL
Oxycodones, IA: NEGATIVE ng/mL
Phencyclidine, IA: NEGATIVE ng/mL
Propoxyphene, IA: NEGATIVE ng/mL
THC(Marijuana) Metabolite, IA: NEGATIVE ng/mL

## 2021-11-26 LAB — COMP. METABOLIC PANEL (12)
AST: 16 IU/L (ref 0–40)
Albumin/Globulin Ratio: 1.8 (ref 1.2–2.2)
Albumin: 4.3 g/dL (ref 3.7–4.7)
Alkaline Phosphatase: 90 IU/L (ref 44–121)
BUN/Creatinine Ratio: 21 (ref 12–28)
BUN: 20 mg/dL (ref 8–27)
Bilirubin Total: 0.4 mg/dL (ref 0.0–1.2)
Calcium: 9.6 mg/dL (ref 8.7–10.3)
Chloride: 105 mmol/L (ref 96–106)
Creatinine, Ser: 0.95 mg/dL (ref 0.57–1.00)
Globulin, Total: 2.4 g/dL (ref 1.5–4.5)
Glucose: 108 mg/dL — ABNORMAL HIGH (ref 70–99)
Potassium: 3.8 mmol/L (ref 3.5–5.2)
Sodium: 145 mmol/L — ABNORMAL HIGH (ref 134–144)
Total Protein: 6.7 g/dL (ref 6.0–8.5)
eGFR: 62 mL/min/{1.73_m2} (ref >59)

## 2021-11-26 LAB — 25-HYDROXY VITAMIN D LCMS D2+D3
25-Hydroxy, Vitamin D-2: <1 ng/mL
25-Hydroxy, Vitamin D-3: 27 ng/mL
25-Hydroxy, Vitamin D: 27 ng/mL — ABNORMAL LOW

## 2021-11-26 LAB — OXYCODONES,MS,WB/SP RFX
Oxycocone: NEGATIVE ng/mL
Oxycodones Confirmation: NEGATIVE
Oxymorphone: NEGATIVE ng/mL

## 2021-11-26 LAB — MAGNESIUM: Magnesium: 2.1 mg/dL (ref 1.6–2.3)

## 2021-11-26 LAB — C-REACTIVE PROTEIN: CRP: 2 mg/L (ref 0–10)

## 2021-11-26 LAB — VITAMIN B12: Vitamin B-12: 622 pg/mL (ref 232–1245)

## 2021-11-26 LAB — SEDIMENTATION RATE: Sed Rate: 15 mm/hr (ref 0–40)

## 2021-12-29 ENCOUNTER — Ambulatory Visit (INDEPENDENT_AMBULATORY_CARE_PROVIDER_SITE_OTHER): Payer: Medicare Other | Admitting: Gastroenterology

## 2021-12-29 ENCOUNTER — Encounter: Payer: Self-pay | Admitting: Gastroenterology

## 2021-12-29 ENCOUNTER — Other Ambulatory Visit: Payer: Self-pay

## 2021-12-29 ENCOUNTER — Ambulatory Visit: Payer: Medicare Other | Admitting: Gastroenterology

## 2021-12-29 VITALS — BP 111/72 | HR 94 | Temp 98.1°F

## 2021-12-29 DIAGNOSIS — R110 Nausea: Secondary | ICD-10-CM

## 2021-12-29 NOTE — Patient Instructions (Signed)

## 2021-12-29 NOTE — Progress Notes (Signed)
Jonathon Bellows MD, MRCP(U.K) [address]  [place] [address]  [place], [place] [address]  Main: [phone]  Fax: [phone]   Gastroenterology Consultation  Referring Provider:     Warren Lacy, AGNP-C Primary Care Physician:  Garwin Brothers, MD Primary Gastroenterologist:  Dr. Jonathon Bellows  Reason for Consultation:     Nausea        HPI:   KANIA REGNIER is a 76 y.o. y/o female referred for consultation & management  by Dr. Garwin Brothers, MD.    She is here with an aide.  She states that she has had 2 episodes of diarrhea and has not had one for a month at least.  She has had nausea for over 15 years.  Gets 1-2 episodes a month which resolves with Tums.  She has had reflux for many years.  No other complaints.  She states that she has difficulty laying flat and she gets short of breath and cannot lay flat at night.  Last colonoscopy was in June 2017 that showed 7 polyps and multiple tubular abnormalities were noted Past Medical History:  Diagnosis Date   Anginal pain ([place])    Anxiety    Arthritis    RA   Asthma    Brain tumor (benign) (HCC)    CHF (congestive heart failure) (HCC)    Collagen vascular disease (HCC)    Complete heart block (HCC)    COPD (chronic obstructive pulmonary disease) (HCC)    Coronary artery disease    DDD (degenerative disc disease)    Depression    Diabetes mellitus    GERD (gastroesophageal reflux disease)    Headache    Heart murmur    Hypercholesteremia    Hypertension    Lumbar degenerative disc disease    Migraines    Obesity    Osteopenia    Osteoporosis    Pacemaker- [employer]    Pneumonia    PTSD (post-traumatic stress disorder)     Past Surgical History:  Procedure Laterality Date   ABDOMINAL HYSTERECTOMY     CERVICAL FUSION     CHOLECYSTECTOMY     COLONOSCOPY WITH PROPOFOL N/A 09/20/2015   Procedure: COLONOSCOPY WITH PROPOFOL;  Surgeon: Josefine Class, MD;  Location: [facility] ENDOSCOPY;  Service: Endoscopy;  Laterality: N/A;    COLONOSCOPY WITH PROPOFOL N/A 01/27/2016   Procedure: COLONOSCOPY WITH PROPOFOL;  Surgeon: Manya Silvas, MD;  Location: [facility] ENDOSCOPY;  Service: Endoscopy;  Laterality: N/A;   FINGER ARTHROPLASTY  02/23/2012   Procedure: FINGER ARTHROPLASTY;  Surgeon: Cammie Sickle., MD;  Location: [facility];  Service: Orthopedics;  Laterality: Left;  Extensor carpi radialis longus to Extensor carpi ulnaris transfer, left Metaphalangeal reconstructions of index and long fingers,   FOOT ARTHROPLASTY     toes x2 rt foot   HAND RECONSTRUCTION  2011   right-multiple finger joint reconst   JOINT REPLACEMENT     bilat knee replacements   KYPHOPLASTY N/A 06/14/2017   Procedure: KYPHOPLASTY L1;  Surgeon: Hessie Knows, MD;  Location: [facility] ORS;  Service: Orthopedics;  Laterality: N/A;   LOOP RECORDER INSERTION N/A 06/23/2017   Procedure: LOOP RECORDER INSERTION;  Surgeon: Isaias Cowman, MD;  Location: [place] [facility];  Service: Cardiovascular;  Laterality: N/A;   LOOP RECORDER REMOVAL N/A 10/21/2018   Procedure: LOOP RECORDER REMOVAL;  Surgeon: Deboraha Sprang, MD;  Location: [place] [facility];  Service: Cardiovascular;  Laterality: N/A;   ORIF FEMUR FRACTURE  Right 10/22/2018   Procedure: OPEN REDUCTION INTERNAL FIXATION (ORIF) DISTAL FEMUR FRACTURE;  Surgeon: Marchia Bond, MD;  Location: [facility];  Service: Orthopedics;  Laterality: Right;   PACEMAKER IMPLANT N/A 10/21/2018   Procedure: PACEMAKER IMPLANT;  Surgeon: Deboraha Sprang, MD;  Location: [place] [facility];  Service: Cardiovascular;  Laterality: N/A;    Prior to Admission medications   Medication Sig Start Date End Date Taking? Authorizing Provider  acetaminophen (TYLENOL) 500 MG tablet Take 1 tablet by mouth 3 (three) times daily with meals as needed. 08/20/21   [provider]  albuterol (VENTOLIN HFA) 108 (90 Base) MCG/ACT inhaler Inhale 1-2 puffs into the lungs every 6 (six) hours as needed for wheezing or  shortness of breath.    [provider]  budesonide (PULMICORT) 0.5 MG/2ML nebulizer solution Inhale 2 mLs into the lungs daily. 11/29/18   [provider]  busPIRone (BUSPAR) 10 MG tablet Take 10 mg by mouth 2 (two) times daily. 12/28/21   [provider]  Calcium Carb-Cholecalciferol (CALCIUM 500/D) 500-400 MG-UNIT CHEW Chew by mouth daily.    [provider]  Cholecalciferol 1.25 MG (50000 UT) TABS Take by mouth once a week.    [provider]  ciclopirox (LOPROX) 0.77 % cream Apply 1 g topically daily. 08/15/21   [provider]  cyanocobalamin 500 MCG tablet Take 500 mcg by mouth daily.    [provider]  diclofenac sodium (VOLTAREN) 1 % GEL Apply 2 g topically 3 (three) times daily. Apply to the fingers    [provider]  escitalopram (LEXAPRO) 20 MG tablet Take 20 mg by mouth daily. 12/09/21   [provider]  famotidine (PEPCID) 40 MG tablet Take 40 mg by mouth daily. 12/02/21   [provider]  fluticasone (VERAMYST) 27.5 MCG/SPRAY nasal spray Place 2 sprays into the nose 2 (two) times daily.    [provider]  folic acid (FOLVITE) 1 MG tablet Take 1 mg by mouth daily.    [provider]  furosemide (LASIX) 40 MG tablet Take 40 mg by mouth 2 (two) times daily.    [provider]  lamoTRIgine (LAMICTAL) 100 MG tablet Take 1 tablet (100 mg total) by mouth daily. 06/02/16   Rainey Pines, MD  lisinopril (ZESTRIL) 2.5 MG tablet Take 1 tablet by mouth daily.    [provider]  Carma Leaven REFILL KIT 25 MCG/ML SOLN SMARTSIG:1 Vial(s) Via Nebulizer Every 12 Hours PRN 07/16/21   [provider]  loperamide (IMODIUM A-D) 2 MG tablet Take 1 tablet (2 mg total) by mouth 4 (four) times daily as needed for diarrhea or loose stools. Patient taking differently: Take 2 mg by mouth as needed for diarrhea or loose stools. 05/10/17   Eula Listen, MD  loratadine  (CLARITIN) 10 MG tablet Take 10 mg by mouth daily.    [provider]  LYRICA 100 MG capsule Take 100 mg by mouth 2 (two) times daily. 12/17/21   [provider]  methotrexate (RHEUMATREX) 2.5 MG tablet 1 tablet daily. 11/18/18   [provider]  mirtazapine (REMERON) 7.5 MG tablet Take 1 tablet by mouth at bedtime.    [provider]  omeprazole (PRILOSEC) 20 MG capsule Take 20 mg by mouth daily.     [provider]  oxycodone (OXY-IR) 5 MG capsule Take 5 mg by mouth every 4 (four) hours as needed.    [provider]  Potassium Chloride ER 20 MEQ TBCR  Take 20 mEq by mouth 2 (two) times daily. 05/07/17   Vaughan Basta, MD  pramipexole (MIRAPEX) 0.25 MG tablet Take 0.25 mg by mouth daily.     [provider]  pravastatin (PRAVACHOL) 20 MG tablet Take 1 tablet by mouth daily. 12/06/19   [provider]  promethazine (PHENERGAN) 25 MG tablet Take 1 tablet by mouth daily. 12/24/19   [provider]  Psyllium (METAMUCIL) WAFR Take 2 g by mouth daily as needed. 11/11/21   [provider]  saxagliptin HCl (ONGLYZA) 5 MG TABS tablet Take 1 tablet by mouth daily.    [provider]  Tofacitinib Citrate (XELJANZ) 5 MG TABS Take 1 tablet by mouth 2 (two) times daily. 03/03/21   [provider]  topiramate (TOPAMAX) 25 MG tablet Take 25 mg by mouth at bedtime.     [provider]  traZODone (DESYREL) 100 MG tablet Take 1 tablet by mouth daily. 09/21/21   [provider]    Family History  Problem Relation Age of Onset   Depression Sister    Migraines Sister    Hypertension Mother    Arthritis/Rheumatoid Mother    Heart attack Father    Hypertension Father    CAD Other    Hypertension Other    Diabetes Mellitus II Other    Arthritis Other      Social History   Tobacco Use   Smoking status: Never   Smokeless tobacco: Never  Vaping Use   Vaping Use: Never used   Substance Use Topics   Alcohol use: No    Alcohol/week: 0.0 standard drinks   Drug use: No    Allergies as of 12/29/2021 - Review Complete 12/29/2021  Allergen Reaction Noted   Amoxicillin Itching and Other (See Comments) 06/23/2017   Gabapentin Other (See Comments) 11/16/2014   Iodine Other (See Comments) 01/26/2012   Naproxen Hives 01/26/2012   Nsaids Other (See Comments) 11/16/2014   Tolmetin Other (See Comments) 11/16/2014    Review of Systems:    All systems reviewed and negative except where noted in HPI.   Physical Exam:  BP 111/72   Pulse 94   Temp 98.1 F (36.7 C) (Rectal)  No LMP recorded. Patient has had a hysterectomy. Psych:  Alert and cooperative. Normal mood and affect. General:   Alert,  Well-developed, in a wheelchair Head:  Normocephalic and atraumatic. Eyes:  Sclera clear, no icterus.   Conjunctiva pink. Ears:  Normal auditory acuity. Neck:  Supple; no masses or thyromegaly. Neurologic:  Alert and oriented x3;  grossly normal neurologically. Psych:  Alert and cooperative. Normal mood and affect.  Imaging Studies: No results found.  Assessment and Plan:   OLUWADEMILADE KELLETT is a 76 y.o. y/o female has been referred for nausea which is ongoing for more than 15 years 1-2 episodes a month which resolves with the use of Tums she also has a history of heartburn for many years.  Not on any medications.  I believe her symptoms of nausea may be related to reflux.  Since her symptoms are very seldom she could use Tums or Pepcid as needed.  Discussed about lifestyle changes including keeping the head end of the bed elevated, avoid eating for 2 hours before bedtime.  In terms of diarrhea she has not had an episode for a month hence does not require evaluation at this point of time.  She has difficulty laying flat and gets short of breath on laying flat hence I would  not recommend a colonoscopy at this point of time the risks exceed the benefits  Follow up in as  needed  Dr Jonathon Bellows MD,MRCP(U.K)

## 2022-01-12 ENCOUNTER — Ambulatory Visit: Payer: Medicare Other | Attending: Pain Medicine | Admitting: Pain Medicine

## 2022-01-12 ENCOUNTER — Encounter: Payer: Self-pay | Admitting: Pain Medicine

## 2022-01-12 VITALS — BP 128/81 | HR 85 | Temp 98.2°F | Resp 16 | Ht <= 58 in | Wt 144.0 lb

## 2022-01-12 DIAGNOSIS — M5136 Other intervertebral disc degeneration, lumbar region: Secondary | ICD-10-CM | POA: Diagnosis present

## 2022-01-12 DIAGNOSIS — G894 Chronic pain syndrome: Secondary | ICD-10-CM

## 2022-01-12 DIAGNOSIS — M47817 Spondylosis without myelopathy or radiculopathy, lumbosacral region: Secondary | ICD-10-CM | POA: Diagnosis present

## 2022-01-12 DIAGNOSIS — E559 Vitamin D deficiency, unspecified: Secondary | ICD-10-CM

## 2022-01-12 DIAGNOSIS — G8929 Other chronic pain: Secondary | ICD-10-CM | POA: Diagnosis present

## 2022-01-12 DIAGNOSIS — M5459 Other low back pain: Secondary | ICD-10-CM

## 2022-01-12 DIAGNOSIS — M25552 Pain in left hip: Secondary | ICD-10-CM | POA: Diagnosis present

## 2022-01-12 DIAGNOSIS — M545 Low back pain, unspecified: Secondary | ICD-10-CM

## 2022-01-12 DIAGNOSIS — M25551 Pain in right hip: Secondary | ICD-10-CM | POA: Insufficient documentation

## 2022-01-12 DIAGNOSIS — M51369 Other intervertebral disc degeneration, lumbar region without mention of lumbar back pain or lower extremity pain: Secondary | ICD-10-CM

## 2022-01-12 MED ORDER — VITAMIN D3 125 MCG (5000 UT) PO CAPS: 1.0000 | ORAL_CAPSULE | Freq: Every day | ORAL | 2 refills | Status: DC

## 2022-01-12 MED ORDER — ERGOCALCIFEROL 1.25 MG (50000 UT) PO CAPS: 50000.0000 [IU] | ORAL_CAPSULE | ORAL | 0 refills | Status: AC

## 2022-01-12 NOTE — Progress Notes (Signed)
PROVIDER NOTE: Information contained herein reflects review and annotations entered in association with encounter. Interpretation of such information and data should be left to medically-trained personnel. Information provided to patient can be located elsewhere in the medical record under "Patient Instructions". Document created using STT-dictation technology, any transcriptional errors that may result from process are unintentional.    Patient: Meghan [name]  Service Category: E/M  Provider: Gaspar Cola, MD  DOB: Jun 29, 1946  DOS: 01/12/2022  Specialty: Interventional Pain Management  MRN: [id number]  Setting: Ambulatory outpatient  PCP: Garwin Brothers, MD  Type: Established Patient    Referring Provider: Garwin Brothers, MD  Location: Office  Delivery: Face-to-face     Primary Reason(s) for Visit: Encounter for evaluation before starting new chronic pain management plan of care (Level of risk: moderate) CC: Hip Pain (right) and Back Pain (lower)  HPI  Meghan [name] is a 76 y.o. year old, female patient, who comes today for a follow-up evaluation to review the test results and decide on a treatment plan. She has Severe bipolar disorder with psychotic features, mood-congruent ([place]); Neurosis, posttraumatic; H/O gastric ulcer; HTN (hypertension); Sepsis ([place]); Left knee pain; GERD (gastroesophageal reflux disease); COPD (chronic obstructive pulmonary disease) ([place]); Depression; Anxiety; Barton's fracture of distal radius, closed; Cephalalgia; DDD (degenerative disc disease), lumbar; Neuritis or radiculitis due to rupture of lumbar intervertebral disc; BP (high blood pressure); Adiposity; Cardiac murmur; Arthritis, degenerative; Cervico-occipital neuralgia; Cervical spine syndrome; Breath shortness; Disordered sleep; Difficulty in walking; Hypokalemia; Hyponatremia; Asthma with acute exacerbation; Chronic obstructive pulmonary disease ([place]); Type 1 diabetes mellitus ([place]); Degeneration of intervertebral disc of  lumbar region; Seropositive rheumatoid arthritis ([place]); Chronic tension-type headache, intractable; Difficulty in walking, not elsewhere classified; Olfactory hallucination; Seizure ([place]); Colitis; Near syncope; Dehydration; Acute on chronic respiratory failure with hypoxia ([place]); Acute respiratory failure with hypoxia ([place]); Pressure injury of skin; Acute on chronic systolic CHF (congestive heart failure) ([place]); Acute lower UTI; Acute respiratory failure ([place]); Syncope; Palliative care encounter; Weakness generalized; Shortness of breath; Complete heart block ([place]); Closed fracture of distal end of right femur ([place]), periprosthetic; Dyspnea; Chronic low back pain (1ry area of Pain) (Bilateral) (R>L) w/o sciatica; Lumbar stenosis with neurogenic claudication; Lumbar degenerative disc disease; Chronic pain syndrome; Pharmacologic therapy; Disorder of skeletal system; Problems influencing health status; Chronic hip pain (2ry area of Pain) (Bilateral) (R>L); Rheumatoid arthritis involving multiple sites with positive rheumatoid factor ([place]); Chronic use of opiate for therapeutic purpose; Vitamin D insufficiency; Lumbar facet joint pain (Bilateral); and Lumbosacral facet syndrome on their problem list. Her primarily concern today is the Hip Pain (right) and Back Pain (lower)  Pain Assessment: Location: Right Hip Radiating: denies Onset: More than a month ago Duration:   Quality: Dull Severity: 10-Worst pain ever/10 (subjective, self-reported pain score)  Effect on ADL: difficulty performing daily activities Timing: Constant Modifying factors: nothing BP: 128/81  HR: 85  Meghan [name] comes in today for a follow-up visit after her initial evaluation on 11/12/2021. Today we went over the results of her tests. These were explained in "Layman's terms". During today's appointment we went over my diagnostic impression, as well as the proposed treatment plan.  Review of initial evaluation (11/12/2021): "According to  the patient the primary area of pain is out of the lower back (Bilateral) (R>L).  The patient denies any recent x-rays or imaging studies of the lower back.  She also denies any surgeries but does admit to currently being involved in physical therapy which she describes that it does seem  to help.  In addition to that, the patient has a history of having had multiple nerve blocks done by the [facility] pain clinic (Dr. Alba Destine and Dr. Sharlet Salina) which she refers do seem to help for approximately 2 weeks or so.  She refers that her low back pain to be worse than her hip pain.  This low back pain today is described to be an 8/10.  The patient also refers that it refers pain down (2ry area of Pain) both of her hips, but worse on the left side going through the buttocks area and the posterior aspect of her thigh down to the knee with no pain going below that level.  Review of interventional therapies performed by other practitioners include: Therapeutic right L5-S1 TFESI x1 (11/04/2021) by Girtha Hake, MD  Therapeutic bilateral L4-5 TFESI x4 (10/07/2021) by Girtha Hake, MD  Therapeutic bilateral L4-5 TFESI x5 (12/27/2019) by Sharlet Salina, DO  Therapeutic bilateral L5-S1 TFESI x2 (07/13/2014) by Sharlet Salina, DO  Therapeutic right L5-S1 TFESI x1 (05/18/2014) by Sharlet Salina, DO Diagnostic left greater occipital NB x1 (01/26/2014) by Ozella Rocks, MD  Diagnostic EEG x1 (12/20/2015) by Gurney Maxin, MD (WNL)   The patient's secondary area pain is that of the hips (Bilateral) (R>L).  The patient indicates not having had any hip surgery or any recent x-rays but she does admit to currently having this physical therapy for the lower back and the hip pain.  When the patient was asked to point out her hip, she ended up pointing at the posterolateral aspect of the iliac crest and she describes that the pain seems to go from there down through the back and lateral aspect of the upper leg but  when no pain, numbness, or weakness below the knee.  This seems to be either articular pain or referred pain from the posterior structures of the lumbar spine.  Although she refers having had injections in the area of the hips by the [place] physicians, I cannot find any reports of those hip injections.  Reviewing the information, it is likely that she was referring to the bilateral transforaminal epidural steroid injections provided by Dr. Kelli Churn since she indicated that those injections were done by "a female doctor at the [facility] pain clinic".  Pharmacotherapy: The patient indicates currently taking Lyrica 100 mg p.o. twice daily.  She also indicates taking oxycodone 5 mg every 6 hours as needed.  She currently denies any side effects or adverse reactions to the medication, but while I was talking to her, she was falling asleep.  I asked her if this was secondary to the medications or perhaps not having been able to sleep last night.  She indicated that we was a little bit of both.  She complains the medications not to last but approximately 3 to 4 hours.  She refers that because of that she wakes up every night between 2 and 5 AM with pain and has to wait until she is given her morning pills in order to get this under control.  Review of the patient's PMP came back completely negative since she appears to be getting her medicine from her nursing home and they do not seem to be required to notify the [facility] PMP system.  For this reason, I have no idea who is prescribing her Lyrica or oxycodone.  Physical exam: Elderly lady on a wheelchair, very somnolent and easily falling asleep.  She presents with significant bilateral ulnar deviation of her  hand joints secondary to rheumatoid arthritis.  She indicates having long-term problems with joint pain secondary to her rheumatoid arthritis.  The patient indicates that she is very much dependent on her wheelchair.  She does admit to being able to  walk for very short distances, with the assistance of her walker.  In addition to the above, the patient has degenerative joint disease secondary to her rheumatoid arthritis affecting multiple joints.  She is seropositive for rheumatoid factor.  In addition to this she also has multiple medical problems including diabetes, COPD, and having a pacemaker.  In addition, today the patient has been having diarrhea and therefore we were unable to get a clean sample of the urine for that drug testing.  For this reason the test was ordered to be IV."  Today I went over the results of the ordered tests in layman's terms.  The patient does seem to have a vitamin D insufficiency.  Today she has been provided with some prescriptions to treat this.  In addition to that, her primary area of pain is that of the lower back.  History and physical exam does seem to go along with bilateral lumbar facet pain.  In view of this today we have offered her to a diagnostic bilateral lumbar facet block under fluoroscopic guidance.  In considering the treatment plan options, Ms. Michelle was reminded that I no longer take patients for medication management only. I asked her to let me know if she had no intention of taking advantage of the interventional therapies, so that we could make arrangements to provide this space to someone interested. I also made it clear that undergoing interventional therapies for the purpose of getting pain medications is very inappropriate on the part of a patient, and it will not be tolerated in this practice. This type of behavior would suggest true addiction and therefore it requires referral to an addiction specialist.   Further details on both, my assessment(s), as well as the proposed treatment plan, please see below.  Controlled Substance Pharmacotherapy Assessment REMS (Risk Evaluation and Mitigation Strategy)  Opioid Analgesic: None MME/day: 0 mg/day  Pill Count: None expected due to no prior  prescriptions written by our practice. No notes on file Pharmacokinetics: Liberation and absorption (onset of action): WNL Distribution (time to peak effect): WNL Metabolism and excretion (duration of action): WNL         Pharmacodynamics: Desired effects: Analgesia: Meghan [name] reports >50% benefit. Functional ability: Patient reports that medication allows her to accomplish basic ADLs Clinically meaningful improvement in function (CMIF): Sustained CMIF goals met Perceived effectiveness: Described as relatively effective, allowing for increase in activities of daily living (ADL) Undesirable effects: Side-effects or Adverse reactions: None reported Monitoring: [place] PMP: PDMP reviewed during this encounter. Online review of the past 49-monthperiod previously conducted. Not applicable at this point since we have not taken over the patient's medication management yet. List of other Serum/Urine Drug Screening Test(s):  Lab Results  Component Value Date   AMPHSCRSER Negative 11/12/2021   BARBSCRSER Negative 11/12/2021   BENZOSCRSER Negative 11/12/2021   COCAINSCRSER Negative 11/12/2021   PCPSCRSER Negative 11/12/2021   THCSCRSER Negative 11/12/2021   OPIATESCRSER Negative 11/12/2021   OXYSCRSER Negative 11/12/2021   PROPOXSCRSER Negative 11/12/2021   List of all UDS test(s) done:  No results found for: TOXASSSELUR, SUMMARY Last UDS on record: No results found for: TOXASSSELUR, SUMMARY UDS interpretation: Not applicable.          Medication Assessment Form: Not  applicable. Treatment compliance: Not applicable Risk Assessment Profile: Aberrant behavior: See initial evaluations. None observed or detected today Comorbid factors increasing risk of overdose: See initial evaluation. No additional risks detected today Opioid risk tool (ORT):     11/12/2021   10:14 AM  Opioid Risk   Alcohol 0  Illegal Drugs 0  Rx Drugs 0  Alcohol 0  Illegal Drugs 0  Rx Drugs 0  Age between 16-45 years   0  History of Preadolescent Sexual Abuse 0  Psychological Disease 2  Bipolar Positive  Opioid Risk Tool Scoring 2  Opioid Risk Interpretation Low Risk    ORT Scoring interpretation table:  Score <3 = Low Risk for SUD  Score between 4-7 = Moderate Risk for SUD  Score >8 = High Risk for Opioid Abuse   Risk of substance use disorder (SUD): Low  Risk Mitigation Strategies:  Patient opioid safety counseling: No controlled substances prescribed. Patient-Prescriber Agreement (PPA): No agreement signed.  Controlled substance notification to other providers: None required. No opioid therapy.  Pharmacologic Plan: Non-opioid analgesic therapy offered. Interventional alternatives discussed.             Laboratory Chemistry Profile   Renal Lab Results  Component Value Date   BUN 20 11/12/2021   CREATININE 0.95 11/12/2021   BCR 21 11/12/2021   GFRAA >60 10/24/2018   GFRNONAA >60 10/02/2020   SPECGRAV 1.027 05/05/2016   PHUR 6.0 05/05/2016   PROTEINUR NEGATIVE 10/21/2018     Electrolytes Lab Results  Component Value Date   NA 145 (H) 11/12/2021   K 3.8 11/12/2021   CL 105 11/12/2021   CALCIUM 9.6 11/12/2021   MG 2.1 11/12/2021   PHOS 1.2 (L) 10/23/2018     Hepatic Lab Results  Component Value Date   AST 16 11/12/2021   ALT 22 10/23/2018   ALBUMIN 4.3 11/12/2021   ALKPHOS 90 11/12/2021   LIPASE 16 05/10/2017     ID Lab Results  Component Value Date   MRSAPCR POSITIVE (A) 10/21/2018     Bone Lab Results  Component Value Date   25OHVITD1 27 (L) 11/12/2021   25OHVITD2 <1.0 11/12/2021   25OHVITD3 27 11/12/2021     Endocrine Lab Results  Component Value Date   GLUCOSE 108 (H) 11/12/2021   GLUCOSEU NEGATIVE 10/21/2018   HGBA1C 5.8 (H) 10/23/2018   CRTSLPL 4.6 10/22/2018     Neuropathy Lab Results  Component Value Date   VITAMINB12 622 11/12/2021   HGBA1C 5.8 (H) 10/23/2018     CNS No results found for: COLORCSF, APPEARCSF, RBCCOUNTCSF, WBCCSF, POLYSCSF,  LYMPHSCSF, EOSCSF, PROTEINCSF, GLUCCSF, JCVIRUS, CSFOLI, IGGCSF, LABACHR, ACETBL, LABACHR, ACETBL   Inflammation (CRP: Acute  ESR: Chronic) Lab Results  Component Value Date   CRP 2 11/12/2021   ESRSEDRATE 15 11/12/2021   LATICACIDVEN 1.0 08/27/2017     Rheumatology No results found for: RF, ANA, LABURIC, URICUR, LYMEIGGIGMAB, LYMEABIGMQN, HLAB27   Coagulation Lab Results  Component Value Date   INR 1.2 10/21/2018   LABPROT 14.9 10/21/2018   PLT 142 (L) 10/02/2020     Cardiovascular Lab Results  Component Value Date   BNP 1,170.0 (H) 05/10/2017   CKTOTAL 186 06/10/2017   CKMB 2.6 11/29/2012   TROPONINI <0.03 10/21/2018   HGB 11.8 (L) 10/02/2020   HCT 35.7 (L) 10/02/2020     Screening Lab Results  Component Value Date   MRSAPCR POSITIVE (A) 10/21/2018     Cancer No results found for: CEA, CA125, LABCA2  Allergens No results found for: ALMOND, APPLE, ASPARAGUS, AVOCADO, BANANA, BARLEY, BASIL, BAYLEAF, GREENBEAN, LIMABEAN, WHITEBEAN, BEEFIGE, REDBEET, BLUEBERRY, BROCCOLI, CABBAGE, MELON, CARROT, CASEIN, CASHEWNUT, CAULIFLOWER, CELERY     Note: Lab results reviewed.  Recent Diagnostic Imaging Review  Cervical Imaging: Cervical CT wo contrast: Results for orders placed during the hospital encounter of 10/16/20 CT Cervical Spine Wo Contrast  Narrative CLINICAL DATA:  Head trauma after falling  EXAM: CT HEAD WITHOUT CONTRAST  TECHNIQUE: Contiguous axial images were obtained from the base of the skull through the vertex without intravenous contrast.  COMPARISON:  October 02, 2020  FINDINGS: Brain: No evidence of acute territorial infarction, hemorrhage, hydrocephalus,extra-axial collection or mass lesion/mass effect. Prior area of encephalomalacia involving the right cerebellum. There is dilatation the ventricles and sulci consistent with age-related atrophy. Low-attenuation changes in the deep white matter consistent with small vessel  ischemia.  Vascular: No hyperdense vessel or unexpected calcification.  Skull: Prior right-sided sub occipital decompression is seen. No fracture or focal lesion identified.  Sinuses/Orbits: The visualized paranasal sinuses are clear. There is small amount of fluid seen within the bilateral mastoid air cells. The orbits and globes intact.  Other: None  Cervical spine:  Alignment: There is straightening of the normal cervical lordosis. There is a minimal anterolisthesis of C4 on C5.  Skull base and vertebrae: Visualized skull base is intact. No atlanto-occipital dissociation. Ankylosis is noted at C5-C6. Again noted is a chronic appearing fracture deformity seen at the posterior left C7 lamina. The vertebral body heights are well maintained. No fracture or pathologic osseous lesion seen.  Soft tissues and spinal canal: The visualized paraspinal soft tissues are unremarkable. No prevertebral soft tissue swelling is seen. The spinal canal is grossly unremarkable, no large epidural collection or significant canal narrowing.  Disc levels: Multilevel cervical spine spondylosis seen with disc height loss disc osteophyte complex and uncovertebral osteophytes most notable C5-C6 with moderate neural foraminal narrowing and mild central canal stenosis.  Upper chest: The lung apices are clear. Thoracic inlet is within normal limits.  Other: None  IMPRESSION: 1. No acute intracranial abnormality. 2. Findings consistent with age related atrophy and chronic small vessel ischemia 3. Right cerebellar encephalomalacia with suboccipital decompression 4.  No acute fracture or malalignment of the spine. 5. A chronic fracture deformity of the posterior left C7 lamina.   Electronically Signed By: Prudencio Pair M.D. On: 10/16/2020 14:22  Shoulder Imaging: Shoulder-R DG: Results for orders placed during the hospital encounter of 10/02/20 DG Shoulder Right  Narrative CLINICAL DATA:   Status post fall.  EXAM: RIGHT SHOULDER - 2+ VIEW  COMPARISON:  March 13, 2018  FINDINGS: There is no evidence of an acute fracture or dislocation. A chronic fracture deformity is seen involving the head and surgical neck of the proximal right humerus. Degenerative changes are seen involving the right acromioclavicular joint and right glenohumeral articulation. Of incidental note is the presence of a small right pleural effusion. Soft tissues are unremarkable.  IMPRESSION: 1. Chronic fracture deformity of the proximal right humerus without evidence of acute osseous abnormality.   Electronically Signed By: Virgina Norfolk M.D. On: 10/02/2020 03:18  Thoracic Imaging: Thoracic MR wo contrast: Results for orders placed during the hospital encounter of 01/29/20 MR THORACIC SPINE WO CONTRAST  Narrative CLINICAL DATA:  Back pain and compression fracture  EXAM: MRI THORACIC AND LUMBAR SPINE WITHOUT CONTRAST  TECHNIQUE: Multiplanar and multiecho pulse sequences of the thoracic and lumbar spine were obtained without intravenous contrast.  COMPARISON:  None.  FINDINGS: MRI THORACIC SPINE FINDINGS  Alignment:  Levoscoliosis with apex at T11  Vertebrae: No fracture, evidence of discitis, or bone lesion.  Cord:  Normal signal and morphology.  Paraspinal and other soft tissues: Negative.  Disc levels:  There is mild generalized degenerative disc disease without spinal canal stenosis. Disc degeneration is worst at T11-12.  MRI LUMBAR SPINE FINDINGS  Segmentation:  Standard.  Alignment: Grade 1 retrolisthesis at L1-2 and grade 1 anterolisthesis at L3-4, L4-5 and L5-S1.  Vertebrae: There is mild edema at T12-L1. There is approximately 30% height loss of the L1 vertebral body. Status post vertebral augmentation at this level.  Conus medullaris and cauda equina: Conus extends to the L2 level. Conus and cauda equina appear normal.  Paraspinal and other soft tissues:  Negative.  Disc levels:  T12-L1: Large central disc protrusion.  Mild spinal canal stenosis.  L1-L2: Mild disc bulge with endplate spurring. There is no spinal canal stenosis. Moderate bilateral neural foraminal stenosis.  L2-L3: Mild disc bulge. There is no spinal canal stenosis. Moderate right neural foraminal stenosis.  L3-L4: Right asymmetric disc bulge. There is no spinal canal stenosis. Moderate right and mild left neural foraminal stenosis.  L4-L5: Severe facet hypertrophy with intermediate disc bulge. Bilateral lateral recess narrowing with mild central spinal canal stenosis. Severe bilateral neural foraminal stenosis.  L5-S1: Moderate facet hypertrophy. There is no spinal canal stenosis. Moderate bilateral neural foraminal stenosis.  Visualized sacrum: Normal.  IMPRESSION: 1. Chronic L1 compression fracture with approximately 30% height loss and mild edema, status post vertebral augmentation. 2. Mild generalized degenerative disc disease of the thoracic spine without spinal canal stenosis. 3. Multilevel moderate disc severe lumbar neural foraminal stenosis. 4. Mild lumbar spinal canal stenosis at multiple levels.   Electronically Signed By: Ulyses Jarred M.D. On: 01/30/2020 02:40  Lumbosacral Imaging: Lumbar MR wo contrast: Results for orders placed during the hospital encounter of 01/29/20 MR LUMBAR SPINE WO CONTRAST  Narrative CLINICAL DATA:  Back pain and compression fracture  EXAM: MRI THORACIC AND LUMBAR SPINE WITHOUT CONTRAST  TECHNIQUE: Multiplanar and multiecho pulse sequences of the thoracic and lumbar spine were obtained without intravenous contrast.  COMPARISON:  None.  FINDINGS: MRI THORACIC SPINE FINDINGS  Alignment:  Levoscoliosis with apex at T11  Vertebrae: No fracture, evidence of discitis, or bone lesion.  Cord:  Normal signal and morphology.  Paraspinal and other soft tissues: Negative.  Disc levels:  There is mild  generalized degenerative disc disease without spinal canal stenosis. Disc degeneration is worst at T11-12.  MRI LUMBAR SPINE FINDINGS  Segmentation:  Standard.  Alignment: Grade 1 retrolisthesis at L1-2 and grade 1 anterolisthesis at L3-4, L4-5 and L5-S1.  Vertebrae: There is mild edema at T12-L1. There is approximately 30% height loss of the L1 vertebral body. Status post vertebral augmentation at this level.  Conus medullaris and cauda equina: Conus extends to the L2 level. Conus and cauda equina appear normal.  Paraspinal and other soft tissues: Negative.  Disc levels:  T12-L1: Large central disc protrusion.  Mild spinal canal stenosis.  L1-L2: Mild disc bulge with endplate spurring. There is no spinal canal stenosis. Moderate bilateral neural foraminal stenosis.  L2-L3: Mild disc bulge. There is no spinal canal stenosis. Moderate right neural foraminal stenosis.  L3-L4: Right asymmetric disc bulge. There is no spinal canal stenosis. Moderate right and mild left neural foraminal stenosis.  L4-L5: Severe facet hypertrophy with intermediate disc bulge. Bilateral lateral recess narrowing with mild central spinal canal stenosis.  Severe bilateral neural foraminal stenosis.  L5-S1: Moderate facet hypertrophy. There is no spinal canal stenosis. Moderate bilateral neural foraminal stenosis.  Visualized sacrum: Normal.  IMPRESSION: 1. Chronic L1 compression fracture with approximately 30% height loss and mild edema, status post vertebral augmentation. 2. Mild generalized degenerative disc disease of the thoracic spine without spinal canal stenosis. 3. Multilevel moderate disc severe lumbar neural foraminal stenosis. 4. Mild lumbar spinal canal stenosis at multiple levels.   Electronically Signed By: Ulyses Jarred M.D. On: 01/30/2020 02:40  Lumbar DG (Complete) 4+V: Results for orders placed during the hospital encounter of 10/16/20 DG Lumbar Spine  Complete  Narrative CLINICAL DATA:  Low back pain since falling yesterday.  EXAM: LUMBAR SPINE - COMPLETE 4+ VIEW  COMPARISON:  Lumbar MRI 01/29/2020.  Radiographs 06/09/2017.  FINDINGS: There are 5 lumbar type vertebral bodies. The bones are diffusely demineralized. The alignment is stable with a convex left scoliosis, grade 1 retrolisthesis at L1-2 and grade 1 anterolisthesis at L4-5. The L1 vertebral body appears unchanged and is post spinal augmentation. No evidence of acute fracture or traumatic subluxation. Multilevel spondylosis appears unchanged.  IMPRESSION: Stable postoperative appearance of the lumbar spine with multilevel spondylosis and scoliosis. No acute osseous findings identified.   Electronically Signed By: Richardean Sale M.D. On: 10/16/2020 14:29  Hip Imaging: Hip-R CT wo contrast: Results for orders placed during the hospital encounter of 06/09/17 CT HIP RIGHT WO CONTRAST  Narrative CLINICAL DATA:  History rheumatoid arthritis. Severe fall with right hip pain.  EXAM: CT OF THE RIGHT HIP WITHOUT CONTRAST  TECHNIQUE: Multidetector CT imaging of the right hip was performed according to the standard protocol. Multiplanar CT image reconstructions were also generated.  COMPARISON:  None.  FINDINGS: Bones/Joint/Cartilage  No fracture or dislocation. Normal alignment. No joint effusion. Hip joint space is maintained. No lytic or sclerotic osseous lesion. Mild osteoarthritis of the sacroiliac joint.  Ligaments  Ligaments are suboptimally evaluated by CT.  Muscles and Tendons Muscles are normal.  No intramuscular fluid collection or hematoma.  Soft tissue No fluid collection or hematoma. No soft tissue mass. Peripheral vascular atherosclerotic disease.  IMPRESSION: No acute osseous injury of the right hip.   Electronically Signed By: Kathreen Devoid On: 06/10/2017 11:38  Hip-R DG 2-3 views: Results for orders placed during the hospital  encounter of 05/24/21 DG Hip Unilat W or Wo Pelvis 2-3 Views Right  Narrative CLINICAL DATA:  Right hip pain after fall  EXAM: DG HIP (WITH OR WITHOUT PELVIS) 2-3V RIGHT  COMPARISON:  10/02/2020  FINDINGS: No evidence of acute fracture or dislocation of the right hip. Partially visualized right femoral diaphyseal rod and screw fixation hardware. Hip joint spaces are preserved. Bony pelvis is intact without evidence of fracture or diastasis. Prominent atherosclerotic vascular calcifications.  IMPRESSION: No acute osseous abnormality of the right hip.   Electronically Signed By: Davina Poke D.O. On: 05/24/2021 19:24  Hip-L DG 2-3 views: Results for orders placed during the hospital encounter of 08/25/17 DG Hip Unilat W or Wo Pelvis 2-3 Views Left  Narrative CLINICAL DATA:  76 year old female with left hip pain.  EXAM: DG HIP (WITH OR WITHOUT PELVIS) 2-3V LEFT  COMPARISON:  None.  FINDINGS: There is no fracture or dislocation. The bones are osteopenic. Degenerative changes of the lower lumbar spine.  Vascular calcification and multiple surgical clips over the pelvis. The soft tissues are grossly unremarkable.  IMPRESSION: No acute fracture or dislocation.   Electronically Signed By: Anner Crete  M.D. On: 08/25/2017 23:46  Knee Imaging: Knee-R DG 4 views: Results for orders placed during the hospital encounter of 04/10/18 DG Knee Complete 4 Views Right  Narrative CLINICAL DATA:  Pain after a fall yesterday.  Subsequent encounter.  EXAM: RIGHT KNEE - COMPLETE 4+ VIEW  COMPARISON:  03/29/2014.  FINDINGS: Prepatellar soft tissue swelling. Nondisplaced fracture involving the midportion of the patella. No other fractures. Prior total knee arthroplasty with anatomic alignment and no complicating features. Osseous demineralization large joint effusion/hemarthrosis.  Femoropopliteal and tibioperoneal artery atherosclerosis.  IMPRESSION: 1.  Nondisplaced fracture involving the midportion of the patella. No other acute fractures. 2. RIGHT total knee arthroplasty with anatomic alignment and no complicating features. 3. Large joint effusion/hemarthrosis.   Electronically Signed By: Evangeline Dakin M.D. On: 04/10/2018 18:28  Complexity Note: Imaging results reviewed. Results shared with Meghan [name], using Layman's terms.                        Meds   Current Outpatient Medications:    acetaminophen (TYLENOL) 500 MG tablet, Take 1 tablet by mouth 3 (three) times daily with meals as needed., Disp: , Rfl:    albuterol (VENTOLIN HFA) 108 (90 Base) MCG/ACT inhaler, Inhale 1-2 puffs into the lungs every 6 (six) hours as needed for wheezing or shortness of breath., Disp: , Rfl:    budesonide (PULMICORT) 0.5 MG/2ML nebulizer solution, Inhale 2 mLs into the lungs daily., Disp: , Rfl:    busPIRone (BUSPAR) 10 MG tablet, Take 10 mg by mouth 2 (two) times daily., Disp: , Rfl:    Calcium Carb-Cholecalciferol (CALCIUM 500/D) 500-400 MG-UNIT CHEW, Chew by mouth daily., Disp: , Rfl:    Cholecalciferol (VITAMIN D3) 125 MCG (5000 UT) CAPS, Take 1 capsule (5,000 Units total) by mouth daily with breakfast. Take along with calcium and magnesium., Disp: 30 capsule, Rfl: 2   Cholecalciferol 1.25 MG (50000 UT) TABS, Take by mouth once a week., Disp: , Rfl:    ciclopirox (LOPROX) 0.77 % cream, Apply 1 g topically daily., Disp: , Rfl:    cyanocobalamin 500 MCG tablet, Take 500 mcg by mouth daily., Disp: , Rfl:    diclofenac sodium (VOLTAREN) 1 % GEL, Apply 2 g topically 3 (three) times daily. Apply to the fingers, Disp: , Rfl:    ergocalciferol (VITAMIN D2) 1.25 MG (50000 UT) capsule, Take 1 capsule (50,000 Units total) by mouth 2 (two) times a week. X 6 weeks., Disp: 12 capsule, Rfl: 0   escitalopram (LEXAPRO) 20 MG tablet, Take 20 mg by mouth daily., Disp: , Rfl:    famotidine (PEPCID) 40 MG tablet, Take 40 mg by mouth daily., Disp: , Rfl:     fluticasone (VERAMYST) 27.5 MCG/SPRAY nasal spray, Place 2 sprays into the nose 2 (two) times daily., Disp: , Rfl:    folic acid (FOLVITE) 1 MG tablet, Take 1 mg by mouth daily., Disp: , Rfl:    furosemide (LASIX) 40 MG tablet, Take 40 mg by mouth 2 (two) times daily., Disp: , Rfl:    lamoTRIgine (LAMICTAL) 100 MG tablet, Take 1 tablet (100 mg total) by mouth daily., Disp: 90 tablet, Rfl: 3   lisinopril (ZESTRIL) 2.5 MG tablet, Take 1 tablet by mouth daily., Disp: , Rfl:    LONHALA MAGNAIR REFILL KIT 25 MCG/ML SOLN, SMARTSIG:1 Vial(s) Via Nebulizer Every 12 Hours PRN, Disp: , Rfl:    loperamide (IMODIUM A-D) 2 MG tablet, Take 1 tablet (2 mg total) by mouth 4 (four)  times daily as needed for diarrhea or loose stools. (Patient taking differently: Take 2 mg by mouth as needed for diarrhea or loose stools.), Disp: 12 tablet, Rfl: 0   loratadine (CLARITIN) 10 MG tablet, Take 10 mg by mouth daily., Disp: , Rfl:    LYRICA 100 MG capsule, Take 100 mg by mouth 2 (two) times daily., Disp: , Rfl:    methotrexate (RHEUMATREX) 2.5 MG tablet, 1 tablet daily., Disp: , Rfl:    mirtazapine (REMERON) 7.5 MG tablet, Take 1 tablet by mouth at bedtime., Disp: , Rfl:    omeprazole (PRILOSEC) 20 MG capsule, Take 20 mg by mouth daily. , Disp: , Rfl:    oxycodone (OXY-IR) 5 MG capsule, Take 5 mg by mouth every 4 (four) hours as needed., Disp: , Rfl:    Potassium Chloride ER 20 MEQ TBCR, Take 20 mEq by mouth 2 (two) times daily., Disp: 30 tablet, Rfl: 0   pramipexole (MIRAPEX) 0.25 MG tablet, Take 0.25 mg by mouth daily. , Disp: , Rfl:    pravastatin (PRAVACHOL) 20 MG tablet, Take 1 tablet by mouth daily., Disp: , Rfl:    promethazine (PHENERGAN) 25 MG tablet, Take 1 tablet by mouth daily., Disp: , Rfl:    Psyllium (METAMUCIL) WAFR, Take 2 g by mouth daily as needed., Disp: , Rfl:    saxagliptin HCl (ONGLYZA) 5 MG TABS tablet, Take 1 tablet by mouth daily., Disp: , Rfl:    Tofacitinib Citrate (XELJANZ) 5 MG TABS, Take 1  tablet by mouth 2 (two) times daily., Disp: , Rfl:    topiramate (TOPAMAX) 25 MG tablet, Take 25 mg by mouth at bedtime. , Disp: , Rfl:    traZODone (DESYREL) 100 MG tablet, Take 1 tablet by mouth daily., Disp: , Rfl:   ROS  Constitutional: Denies any fever or chills Gastrointestinal: No reported hemesis, hematochezia, vomiting, or acute GI distress Musculoskeletal: Denies any acute onset joint swelling, redness, loss of ROM, or weakness Neurological: No reported episodes of acute onset apraxia, aphasia, dysarthria, agnosia, amnesia, paralysis, loss of coordination, or loss of consciousness  Allergies  Meghan [name] is allergic to amoxicillin, gabapentin, iodine, naproxen, nsaids, and tolmetin.  [place]  Drug: Meghan [name]  reports no history of drug use. Alcohol:  reports no history of alcohol use. Tobacco:  reports that she has never smoked. She has never used smokeless tobacco. Medical:  has a past medical history of Anginal pain ([place]), Anxiety, Arthritis, Asthma, Brain tumor (benign) ([place]), CHF (congestive heart failure) ([place]), Collagen vascular disease ([place]), Complete heart block ([place]), COPD (chronic obstructive pulmonary disease) ([place]), Coronary artery disease, DDD (degenerative disc disease), Depression, Diabetes mellitus, GERD (gastroesophageal reflux disease), Headache, Heart murmur, Hypercholesteremia, Hypertension, Lumbar degenerative disc disease, Migraines, Obesity, Osteopenia, Osteoporosis, Pacemaker- St Jude, Pneumonia, and PTSD (post-traumatic stress disorder). Surgical: Meghan [name]  has a past surgical history that includes Hand reconstruction (2011); Abdominal hysterectomy; Cervical fusion; Joint replacement; Cholecystectomy; Foot arthroplasty; Finger arthroplasty (02/23/2012); Colonoscopy with propofol (N/A, 09/20/2015); Colonoscopy with propofol (N/A, 01/27/2016); Kyphoplasty (N/A, 06/14/2017); LOOP RECORDER INSERTION (N/A, 06/23/2017); PACEMAKER IMPLANT (N/A, 10/21/2018); LOOP RECORDER REMOVAL  (N/A, 10/21/2018); and ORIF femur fracture (Right, 10/22/2018). Family: family history includes Arthritis in an other family member; Arthritis/Rheumatoid in her mother; CAD in an other family member; Depression in her sister; Diabetes Mellitus II in an other family member; Heart attack in her father; Hypertension in her father, mother, and another family member; Migraines in her sister.  Constitutional Exam  General appearance: Well nourished, well developed,  and well hydrated. In no apparent acute distress Vitals:   01/12/22 1346  BP: 128/81  Pulse: 85  Resp: 16  Temp: 98.2 F (36.8 C)  TempSrc: Temporal  SpO2: 96%  Weight: 144 lb (65.3 kg)  Height: _0  (1.397 m)   BMI Assessment: Estimated body mass index is 33.47 kg/m as calculated from the following:   Height as of this encounter: _1  (1.397 m).   Weight as of this encounter: 144 lb (65.3 kg).  BMI interpretation table: BMI level Category Range association with higher incidence of chronic pain  <18 kg/m2 Underweight   18.5-24.9 kg/m2 Ideal body weight   25-29.9 kg/m2 Overweight Increased incidence by 20%  30-34.9 kg/m2 Obese (Class I) Increased incidence by 68%  35-39.9 kg/m2 Severe obesity (Class II) Increased incidence by 136%  >40 kg/m2 Extreme obesity (Class III) Increased incidence by 254%   Patient's current BMI Ideal Body weight  Body mass index is 33.47 kg/m. Patient must be at least 60 in tall to calculate ideal body weight   BMI Readings from Last 4 Encounters:  01/12/22 33.47 kg/m  11/21/21 26.08 kg/m  11/12/21 26.04 kg/m  10/27/21 33.76 kg/m   Wt Readings from Last 4 Encounters:  01/12/22 144 lb (65.3 kg)  11/21/21 147 lb 3.2 oz (66.8 kg)  11/12/21 147 lb (66.7 kg)  10/27/21 150 lb 9.6 oz (68.3 kg)    Psych/Mental status: Alert, oriented x 3 (person, place, & time)       Eyes: PERLA Respiratory: No evidence of acute respiratory distress  Assessment & Plan  Primary Diagnosis & Pertinent  Problem List: The primary encounter diagnosis was Chronic pain syndrome. Diagnoses of Chronic low back pain (1ry area of Pain) (Bilateral) (R>L) w/o sciatica, Chronic hip pain (2ry area of Pain) (Bilateral) (R>L), Vitamin D insufficiency, DDD (degenerative disc disease), lumbar, Lumbar facet joint pain (Bilateral), and Lumbosacral facet syndrome were also pertinent to this visit.  Visit Diagnosis: 1. Chronic pain syndrome   2. Chronic low back pain (1ry area of Pain) (Bilateral) (R>L) w/o sciatica   3. Chronic hip pain (2ry area of Pain) (Bilateral) (R>L)   4. Vitamin D insufficiency   5. DDD (degenerative disc disease), lumbar   6. Lumbar facet joint pain (Bilateral)   7. Lumbosacral facet syndrome    Problems updated and reviewed during this visit: Problem  Lumbar facet joint pain (Bilateral)  Lumbosacral facet syndrome  Lumbar Degenerative Disc Disease   Formatting of this note might be different from the original. Spondylolisthesis.  Spinal stenosis.    Vitamin D Insufficiency    Plan of Care  Pharmacotherapy (Medications Ordered): Meds ordered this encounter  Medications   ergocalciferol (VITAMIN D2) 1.25 MG (50000 UT) capsule    Sig: Take 1 capsule (50,000 Units total) by mouth 2 (two) times a week. X 6 weeks.    Dispense:  12 capsule    Refill:  0    Fill one day early if pharmacy is closed on scheduled refill date. May substitute for generic, or similar, if available.   Cholecalciferol (VITAMIN D3) 125 MCG (5000 UT) CAPS    Sig: Take 1 capsule (5,000 Units total) by mouth daily with breakfast. Take along with calcium and magnesium.    Dispense:  30 capsule    Refill:  2    Fill 1 day early if pharmacy is closed on scheduled refill date. Generic permitted. Do not send renewal requests.   Procedure Orders  LUMBAR FACET(MEDIAL BRANCH NERVE BLOCK) MBNB     Lab Orders  No laboratory test(s) ordered today   Imaging Orders  No imaging studies ordered today    Referral Orders  No referral(s) requested today    Pharmacological management options:  Opioid Analgesics: I will not be prescribing any opioids at this time Membrane stabilizer: I will not be prescribing any at this time Muscle relaxant: I will not be prescribing any at this time NSAID: I will not be prescribing any at this time Other analgesic(s): I will not be prescribing any at this time      Interventional Therapies  Risk  Complexity Considerations:   Estimated body mass index is 26.04 kg/m as calculated from the following:   Height as of this encounter: _0  (1.6 m).   Weight as of this encounter: 147 lb (66.7 kg). WNL   Planned  Pending:   Diagnostic lumbar facet MBB #1    Under consideration:   Diagnostic lumbar facet MBB #1    Completed:   None at this time   Completed by other providers:   Therapeutic right L5-S1 TFESI x1 (11/04/2021) by Girtha Hake, MD  Therapeutic bilateral L4-5 TFESI x4 (10/07/2021) by Girtha Hake, MD  Therapeutic bilateral L4-5 TFESI x5 (12/27/2019) by Sharlet Salina, DO  Therapeutic bilateral L5-S1 TFESI x2 (07/13/2014) by Sharlet [place]ina, DO  Therapeutic right L5-S1 TFESI x1 (05/18/2014) by Sharlet [place]ina, DO Diagnostic left greater occipital NB x1 (01/26/2014) by Ozella Rocks, MD  Diagnostic EEG x1 (12/20/2015) by Gurney Maxin, MD (WNL)    Therapeutic  Palliative (PRN) options:   None established    Provider-requested follow-up: Return for (Clinic) procedure: (B) L-FCT Blk #1. Recent Visits Date Type Provider Dept  11/12/21 Office Visit Milinda Pointer, MD [facility]  Showing recent visits within past 90 days and meeting all other requirements Today's Visits Date Type Provider Dept  01/12/22 Office Visit Milinda Pointer, MD [facility]  Showing today's visits and meeting all other requirements Future Appointments No visits were found meeting these conditions. Showing future  appointments within next 90 days and meeting all other requirements  Primary Care Physician: Garwin Brothers, MD Note by: Gaspar Cola, MD Date: 01/12/2022; Time: 2:18 PM

## 2022-01-12 NOTE — Patient Instructions (Addendum)
______________________________________________________________________  Preparing for Procedure with Sedation  NOTICE: Due to recent regulatory changes, starting on March 10, 2021, procedures requiring intravenous (IV) sedation will no longer be performed at the [facility].  These types of procedures are required to be performed at [facility] facility.  We are very sorry for the inconvenience.  Procedure appointments are limited to planned procedures: No Prescription Refills. No disability issues will be discussed. No medication changes will be discussed.  Instructions: Oral Intake: Do not eat or drink anything for at least 2 hours prior to your procedure. (Exception: Blood Pressure Medication. See below.) Transportation: A driver is required. You may not drive yourself after the procedure. Blood Pressure Medicine: Do not forget to take your blood pressure medicine with a sip of water the morning of the procedure. If your Diastolic (lower reading) is above 100 mmHg, elective cases will be cancelled/rescheduled. Blood thinners: These will need to be stopped for procedures. Notify our staff if you are taking any blood thinners. Depending on which one you take, there will be specific instructions on how and when to stop it. Diabetics on insulin: Notify the staff so that you can be scheduled 1st case in the morning. If your diabetes requires high dose insulin, take only  of your normal insulin dose the morning of the procedure and notify the staff that you have done so. Preventing infections: Shower with an antibacterial soap the morning of your procedure. Build-up your immune system: Take 1000 mg of Vitamin C with every meal (3 times a day) the day prior to your procedure. Antibiotics: Inform the staff if you have a condition or reason that requires you to take antibiotics before dental procedures. Pregnancy: If you are pregnant, call and cancel the procedure. Sickness: If  you have a cold, fever, or any active infections, call and cancel the procedure. Arrival: You must be in the facility at least 30 minutes prior to your scheduled procedure. Children: Do not bring children with you. Dress appropriately: There is always the possibility that your clothing may get soiled. Valuables: Do not bring any jewelry or valuables.  Reasons to call and reschedule or cancel your procedure: (Following these recommendations will minimize the risk of a serious complication.) Surgeries: Avoid having procedures within 2 weeks of any surgery. (Avoid for 2 weeks before or after any surgery). Flu Shots: Avoid having procedures within 2 weeks of a flu shots. (Avoid for 2 weeks before or after immunizations). Barium: Avoid having a procedure within 7-10 days after having had a radiological study involving the use of radiological contrast. (Myelograms, Barium swallow or enema study). Heart attacks: Avoid any elective procedures or surgeries for the initial 6 months after a "Myocardial Infarction" (Heart Attack). Blood thinners: It is imperative that you stop these medications before procedures. Let us know if you if you take any blood thinner.  Infection: Avoid procedures during or within two weeks of an infection (including chest colds or gastrointestinal problems). Symptoms associated with infections include: Localized redness, fever, chills, night sweats or profuse sweating, burning sensation when voiding, cough, congestion, stuffiness, runny nose, sore throat, diarrhea, nausea, vomiting, cold or Flu symptoms, recent or current infections. It is specially important if the infection is over the area that we intend to treat. Heart and lung problems: Symptoms that may suggest an active cardiopulmonary problem include: cough, chest pain, breathing difficulties or shortness of breath, dizziness, ankle swelling, uncontrolled high or unusually low blood pressure, and/or palpitations. If you are  experiencing any of these symptoms, cancel your procedure and contact your primary care physician for an evaluation.  Remember:  Regular Business hours are:  Monday to Thursday 8:00 AM to 4:00 PM  Provider's Schedule: Francisco Naveira, MD:  Procedure days: Tuesday and Thursday 7:30 AM to 4:00 PM  Bilal Lateef, MD:  Procedure days: Monday and Wednesday 7:30 AM to 4:00 PM ______________________________________________________________________  ____________________________________________________________________________________________  General Risks and Possible Complications  Patient Responsibilities: It is important that you read this as it is part of your informed consent. It is our duty to inform you of the risks and possible complications associated with treatments offered to you. It is your responsibility as a patient to read this and to ask questions about anything that is not clear or that you believe was not covered in this document.  Patient's Rights: You have the right to refuse treatment. You also have the right to change your mind, even after initially having agreed to have the treatment done. However, under this last option, if you wait until the last second to change your mind, you may be charged for the materials used up to that point.  Introduction: Medicine is not an exact science. Everything in Medicine, including the lack of treatment(s), carries the potential for danger, harm, or loss (which is by definition: Risk). In Medicine, a complication is a secondary problem, condition, or disease that can aggravate an already existing one. All treatments carry the risk of possible complications. The fact that a side effects or complications occurs, does not imply that the treatment was conducted incorrectly. It must be clearly understood that these can happen even when everything is done following the highest safety standards.  No treatment: You can choose not to proceed with the  proposed treatment alternative. The "PRO(s)" would include: avoiding the risk of complications associated with the therapy. The "CON(s)" would include: not getting any of the treatment benefits. These benefits fall under one of three categories: diagnostic; therapeutic; and/or palliative. Diagnostic benefits include: getting information which can ultimately lead to improvement of the disease or symptom(s). Therapeutic benefits are those associated with the successful treatment of the disease. Finally, palliative benefits are those related to the decrease of the primary symptoms, without necessarily curing the condition (example: decreasing the pain from a flare-up of a chronic condition, such as incurable terminal cancer).  General Risks and Complications: These are associated to most interventional treatments. They can occur alone, or in combination. They fall under one of the following six (6) categories: no benefit or worsening of symptoms; bleeding; infection; nerve damage; allergic reactions; and/or death. No benefits or worsening of symptoms: In Medicine there are no guarantees, only probabilities. No healthcare provider can ever guarantee that a medical treatment will work, they can only state the probability that it may. Furthermore, there is always the possibility that the condition may worsen, either directly, or indirectly, as a consequence of the treatment. Bleeding: This is more common if the patient is taking a blood thinner, either prescription or over the counter (example: Goody Powders, Fish oil, Aspirin, Garlic, etc.), or if suffering a condition associated with impaired coagulation (example: Hemophilia, cirrhosis of the liver, low platelet counts, etc.). However, even if you do not have one on these, it can still happen. If you have any of these conditions, or take one of these drugs, make sure to notify your treating physician. Infection: This is more common in patients with a compromised  immune system, either due to disease (example:   diabetes, cancer, human immunodeficiency virus [HIV], etc.), or due to medications or treatments (example: therapies used to treat cancer and rheumatological diseases). However, even if you do not have one on these, it can still happen. If you have any of these conditions, or take one of these drugs, make sure to notify your treating physician. Nerve Damage: This is more common when the treatment is an invasive one, but it can also happen with the use of medications, such as those used in the treatment of cancer. The damage can occur to small secondary nerves, or to large primary ones, such as those in the spinal cord and brain. This damage may be temporary or permanent and it may lead to impairments that can range from temporary numbness to permanent paralysis and/or brain death. Allergic Reactions: Any time a substance or material comes in contact with our body, there is the possibility of an allergic reaction. These can range from a mild skin rash (contact dermatitis) to a severe systemic reaction (anaphylactic reaction), which can result in death. Death: In general, any medical intervention can result in death, most of the time due to an unforeseen complication. ____________________________________________________________________________________________  

## 2022-01-16 ENCOUNTER — Encounter: Payer: Self-pay | Admitting: Nurse Practitioner

## 2022-01-16 ENCOUNTER — Non-Acute Institutional Stay: Payer: Medicare Other | Admitting: Nurse Practitioner

## 2022-01-16 VITALS — BP 118/74 | HR 78 | Temp 97.7°F | Resp 18 | Wt 144.6 lb

## 2022-01-16 DIAGNOSIS — R0602 Shortness of breath: Secondary | ICD-10-CM

## 2022-01-16 DIAGNOSIS — R110 Nausea: Secondary | ICD-10-CM

## 2022-01-16 DIAGNOSIS — I509 Heart failure, unspecified: Secondary | ICD-10-CM

## 2022-01-16 DIAGNOSIS — Z515 Encounter for palliative care: Secondary | ICD-10-CM

## 2022-01-16 NOTE — Progress Notes (Signed)
[place] Consult Note Telephone: [phone]  Fax: [phone]    Date of encounter: 01/16/22 1:03 PM [name] [id number]   [id number] (home)  DOB: 1945-10-07 MRN: [id number] PRIMARY CARE PROVIDER:    [place]:    Contact Information     Name Relation Home Work [place], [place] Relative   [phone]   Azzie Glatter   [place], [place] Daughter   [phone]      I met face to face with patient in facility. Palliative Care was asked to follow this patient by consultation request of  [place] to address advance care planning and complex medical decision making. This is a follow up visit.                                  ASSESSMENT AND PLAN / RECOMMENDATIONS:  Symptom Management/Plan: 1. ACP: Medical goals consists DNR, do not intubate. Wishes are for more conservative care to include antibiotics, IV fluids, blood transfusions, diagnostic testing, lab testing. Wishes are to minimize hospitalizations if necessary.    2. Shortness of breath stable secondary to congestive heart failure/COPD Continue daily weights and outpatient Cardiology appointments, discussed nebulizers with inhalation therapy.   3. Palliative care encounter; Palliative medicine team will continue to support patient, patient's family, and medical team. Visit consisted of counseling and education dealing with the complex and emotionally intense issues of symptom management.   4. Chronic nausea with diarrhea with chronic constipation discussed at length, continue recommendations with gi, improved symptoms   5. F/u 1 months for ongoing monitoring disease progression, chronic nausea, debility.    Follow up Palliative Care Visit: Palliative care will continue to follow for complex medical decision making, advance care planning,  and clarification of goals. Return 4 weeks or prn.   I spent 46 minutes providing this consultation at 12:00pm. More than 50% of the time in this consultation was spent in counseling and care coordination.   PPS: 50%   Chief Complaint: Follow up palliative consult for complex medical decision making   HISTORY OF PRESENT ILLNESS:  Meghan [name] is a 75 y.o. year old female  with  multiple medical problems including COPD, congestive heart failure with ef 20%, asthma, collagen vascular disease, coronary artery disease, heart murmur, benign brain tumor, hypertension, gerd, hypercholesterolemia, history of headache, diabetes, gerd, arthritis, osteoporosis, lumbar degenerative disc disease, anxiety, PTSD, depression, kyphoplasty, right hand reconstruction 2011, cholecystectomy, cervical fusion, abdominal hysterectomy. Ms Garciaperez continues to reside in [place] at [facility] center. Ms Lebon is able to get up out of bed ambulate short distances in her room with a walker. Ms Cowman does require assistance to perform adls as she is difficulty with her hands. Ms Freshour does feed herself in appetite varies with current weight 144.6 lbs. Recent visit to pain management and orthopedic for spinal stenosis. Pain has been better. No recent falls, wounds, hospitalizations per staff. Staff endorses no other changes or concerns. At present Ms. Crosley is sitting in the w/c peddling into medical records to see [facility] provider, appears comfortable. We talked about purpose of pc visit. We talked about how she has been feeling. Ms. Whaling endorses she continues to have intermit nausea with diarrhea, has been doing better, sitting up when  eating, not as many episodes of nausea. We talked about appetite, weight, nutrition. We talked about symptoms of pain, ros, shortness of breath, edema. We talked about chronic disease progression. We talked about pain, going to pain clinic, spinal stenosis effective  pain relief. We talked about medical goals reviewed. We talked about daily routine, residing at [facility] facility, quality of life. Medical goals reviewed. [name] in agreement. We talked about residing at facility. We talked about psychiatry with psychotherapy which continues to help. We talked about importance of mobility, social interactions at facility. We talked about role pc in poc. Therapeutic listening, emotional support provided. Questions answered, updated staff. Most PC visit supportive, Ms. Arechiga was appreciative of visit. We talked about f/u PC visit and updated staff.   History obtained from review of EMR, discussion with facility staff and [name].  I reviewed available labs, medications, imaging, studies and related documents from the EMR.  Records reviewed and summarized above.    ROS 10 point system reviewed all negative except HPI   Physical Exam: Constitutional: NAD General: chronically ill, pleasant female EYES:  lids intact ENMT: oral mucous membranes moist CV: S1S2, RRR Pulmonary: LCTA, no increased work of breathing, no cough, room air Abdomen: soft and non tender GU: deferred MSK: few steps, ambulatory in her room, does use w/c; limited use BLE with contractures from RA Skin: warm and dry Neuro:  + generalized weakness,  no cognitive impairment Psych: non-anxious affect, A and O x 3  Thank you for the opportunity to participate in the care of [name].  The palliative care team will continue to follow. Please call our office at [phone] if we can be of additional assistance.   Lavante Toso Ihor Gully, NP

## 2022-01-19 ENCOUNTER — Telehealth: Payer: Self-pay

## 2022-01-19 ENCOUNTER — Ambulatory Visit (INDEPENDENT_AMBULATORY_CARE_PROVIDER_SITE_OTHER): Payer: Medicare Other

## 2022-01-19 DIAGNOSIS — I442 Atrioventricular block, complete: Secondary | ICD-10-CM | POA: Diagnosis not present

## 2022-01-19 NOTE — Telephone Encounter (Signed)
I called the patient to let her know we got her transmission. No answer/no voicemail.

## 2022-01-20 LAB — CUP PACEART REMOTE DEVICE CHECK
Battery Remaining Longevity: 69 mo
Battery Remaining Percentage: 65 %
Battery Voltage: 2.98 V
Brady Statistic AP VP Percent: 1 %
Brady Statistic AP VS Percent: 1 %
Brady Statistic AS VP Percent: 99 %
Brady Statistic AS VS Percent: 1 %
Brady Statistic RA Percent Paced: 1 %
Brady Statistic RV Percent Paced: 99 %
Date Time Interrogation Session: 20230612084817
Implantable Lead Implant Date: 20200313
Implantable Lead Implant Date: 20200313
Implantable Lead Location: 753859
Implantable Lead Location: 753860
Implantable Lead Model: 5076
Implantable Lead Model: 5076
Implantable Pulse Generator Implant Date: 20200313
Lead Channel Impedance Value: 400 Ohm
Lead Channel Impedance Value: 490 Ohm
Lead Channel Pacing Threshold Amplitude: 0.25 V
Lead Channel Pacing Threshold Amplitude: 0.5 V
Lead Channel Pacing Threshold Pulse Width: 0.5 ms
Lead Channel Pacing Threshold Pulse Width: 0.5 ms
Lead Channel Sensing Intrinsic Amplitude: 12 mV
Lead Channel Sensing Intrinsic Amplitude: 2.1 mV
Lead Channel Setting Pacing Amplitude: 2 V
Lead Channel Setting Pacing Amplitude: 2.5 V
Lead Channel Setting Pacing Pulse Width: 0.5 ms
Lead Channel Setting Sensing Sensitivity: 4 mV
Pulse Gen Model: 2272
Pulse Gen Serial Number: 9118930

## 2022-02-02 ENCOUNTER — Telehealth: Payer: Self-pay | Admitting: Internal Medicine

## 2022-02-02 NOTE — Telephone Encounter (Signed)
3 attempts to schedule fu appt from recall list.   Deleting recall.   

## 2022-02-09 NOTE — Progress Notes (Signed)
Remote pacemaker transmission.   

## 2022-03-11 ENCOUNTER — Encounter: Payer: Self-pay | Admitting: Internal Medicine

## 2022-03-11 ENCOUNTER — Inpatient Hospital Stay
Admit: 2022-03-11 | Discharge: 2022-03-11 | Disposition: A | Discharge status: Home / Self Care | Payer: Medicare Other | Attending: Cardiology | Admitting: Cardiology

## 2022-03-11 ENCOUNTER — Emergency Department: Payer: Medicare Other

## 2022-03-11 ENCOUNTER — Other Ambulatory Visit: Payer: Self-pay

## 2022-03-11 ENCOUNTER — Inpatient Hospital Stay
Admission: EM | Admit: 2022-03-11 | Discharge: 2022-03-16 | DRG: 492 | Disposition: A | Discharge status: Skilled Nursing Facility | Payer: Medicare Other | Source: Skilled Nursing Facility | Attending: Internal Medicine | Admitting: Internal Medicine

## 2022-03-11 DIAGNOSIS — J9601 Acute respiratory failure with hypoxia: Secondary | ICD-10-CM | POA: Diagnosis not present

## 2022-03-11 DIAGNOSIS — M545 Low back pain, unspecified: Secondary | ICD-10-CM | POA: Diagnosis present

## 2022-03-11 DIAGNOSIS — E7800 Pure hypercholesterolemia, unspecified: Secondary | ICD-10-CM | POA: Diagnosis present

## 2022-03-11 DIAGNOSIS — M059 Rheumatoid arthritis with rheumatoid factor, unspecified: Secondary | ICD-10-CM | POA: Diagnosis present

## 2022-03-11 DIAGNOSIS — E876 Hypokalemia: Secondary | ICD-10-CM | POA: Diagnosis present

## 2022-03-11 DIAGNOSIS — D61818 Other pancytopenia: Secondary | ICD-10-CM | POA: Diagnosis present

## 2022-03-11 DIAGNOSIS — R569 Unspecified convulsions: Secondary | ICD-10-CM | POA: Diagnosis present

## 2022-03-11 DIAGNOSIS — R509 Fever, unspecified: Secondary | ICD-10-CM | POA: Diagnosis not present

## 2022-03-11 DIAGNOSIS — Z818 Family history of other mental and behavioral disorders: Secondary | ICD-10-CM

## 2022-03-11 DIAGNOSIS — S82301A Unspecified fracture of lower end of right tibia, initial encounter for closed fracture: Secondary | ICD-10-CM | POA: Diagnosis present

## 2022-03-11 DIAGNOSIS — G8929 Other chronic pain: Secondary | ICD-10-CM | POA: Diagnosis present

## 2022-03-11 DIAGNOSIS — Z79899 Other long term (current) drug therapy: Secondary | ICD-10-CM

## 2022-03-11 DIAGNOSIS — E119 Type 2 diabetes mellitus without complications: Secondary | ICD-10-CM | POA: Diagnosis present

## 2022-03-11 DIAGNOSIS — Z833 Family history of diabetes mellitus: Secondary | ICD-10-CM

## 2022-03-11 DIAGNOSIS — Z91041 Radiographic dye allergy status: Secondary | ICD-10-CM

## 2022-03-11 DIAGNOSIS — Z7951 Long term (current) use of inhaled steroids: Secondary | ICD-10-CM

## 2022-03-11 DIAGNOSIS — S82891K Other fracture of right lower leg, subsequent encounter for closed fracture with nonunion: Secondary | ICD-10-CM

## 2022-03-11 DIAGNOSIS — R400 Somnolence: Secondary | ICD-10-CM

## 2022-03-11 DIAGNOSIS — Z950 Presence of cardiac pacemaker: Secondary | ICD-10-CM

## 2022-03-11 DIAGNOSIS — F32A Depression, unspecified: Secondary | ICD-10-CM | POA: Diagnosis present

## 2022-03-11 DIAGNOSIS — K219 Gastro-esophageal reflux disease without esophagitis: Secondary | ICD-10-CM | POA: Diagnosis present

## 2022-03-11 DIAGNOSIS — M810 Age-related osteoporosis without current pathological fracture: Secondary | ICD-10-CM | POA: Diagnosis present

## 2022-03-11 DIAGNOSIS — I959 Hypotension, unspecified: Secondary | ICD-10-CM | POA: Diagnosis not present

## 2022-03-11 DIAGNOSIS — I110 Hypertensive heart disease with heart failure: Secondary | ICD-10-CM | POA: Diagnosis present

## 2022-03-11 DIAGNOSIS — E8729 Other acidosis: Secondary | ICD-10-CM | POA: Diagnosis present

## 2022-03-11 DIAGNOSIS — Z86011 Personal history of benign neoplasm of the brain: Secondary | ICD-10-CM

## 2022-03-11 DIAGNOSIS — I10 Essential (primary) hypertension: Secondary | ICD-10-CM | POA: Diagnosis present

## 2022-03-11 DIAGNOSIS — W19XXXD Unspecified fall, subsequent encounter: Secondary | ICD-10-CM

## 2022-03-11 DIAGNOSIS — I442 Atrioventricular block, complete: Secondary | ICD-10-CM | POA: Diagnosis present

## 2022-03-11 DIAGNOSIS — F419 Anxiety disorder, unspecified: Secondary | ICD-10-CM | POA: Diagnosis present

## 2022-03-11 DIAGNOSIS — S0990XA Unspecified injury of head, initial encounter: Secondary | ICD-10-CM | POA: Diagnosis present

## 2022-03-11 DIAGNOSIS — Z981 Arthrodesis status: Secondary | ICD-10-CM

## 2022-03-11 DIAGNOSIS — G9341 Metabolic encephalopathy: Secondary | ICD-10-CM | POA: Diagnosis present

## 2022-03-11 DIAGNOSIS — I2510 Atherosclerotic heart disease of native coronary artery without angina pectoris: Secondary | ICD-10-CM | POA: Diagnosis present

## 2022-03-11 DIAGNOSIS — I5033 Acute on chronic diastolic (congestive) heart failure: Secondary | ICD-10-CM | POA: Diagnosis present

## 2022-03-11 DIAGNOSIS — Z6833 Body mass index (BMI) 33.0-33.9, adult: Secondary | ICD-10-CM

## 2022-03-11 DIAGNOSIS — Z8249 Family history of ischemic heart disease and other diseases of the circulatory system: Secondary | ICD-10-CM

## 2022-03-11 DIAGNOSIS — Z90710 Acquired absence of both cervix and uterus: Secondary | ICD-10-CM

## 2022-03-11 DIAGNOSIS — Z96653 Presence of artificial knee joint, bilateral: Secondary | ICD-10-CM | POA: Diagnosis present

## 2022-03-11 DIAGNOSIS — J9811 Atelectasis: Secondary | ICD-10-CM | POA: Diagnosis not present

## 2022-03-11 DIAGNOSIS — Z9049 Acquired absence of other specified parts of digestive tract: Secondary | ICD-10-CM

## 2022-03-11 DIAGNOSIS — J9602 Acute respiratory failure with hypercapnia: Secondary | ICD-10-CM | POA: Diagnosis not present

## 2022-03-11 DIAGNOSIS — W010XXA Fall on same level from slipping, tripping and stumbling without subsequent striking against object, initial encounter: Secondary | ICD-10-CM | POA: Diagnosis present

## 2022-03-11 DIAGNOSIS — R4182 Altered mental status, unspecified: Secondary | ICD-10-CM | POA: Diagnosis present

## 2022-03-11 DIAGNOSIS — J449 Chronic obstructive pulmonary disease, unspecified: Secondary | ICD-10-CM | POA: Diagnosis present

## 2022-03-11 DIAGNOSIS — Y92002 Bathroom of unspecified non-institutional (private) residence single-family (private) house as the place of occurrence of the external cause: Secondary | ICD-10-CM | POA: Diagnosis not present

## 2022-03-11 DIAGNOSIS — S82891A Other fracture of right lower leg, initial encounter for closed fracture: Secondary | ICD-10-CM | POA: Diagnosis present

## 2022-03-11 DIAGNOSIS — W19XXXA Unspecified fall, initial encounter: Principal | ICD-10-CM | POA: Diagnosis present

## 2022-03-11 DIAGNOSIS — E669 Obesity, unspecified: Secondary | ICD-10-CM | POA: Diagnosis present

## 2022-03-11 LAB — ECHOCARDIOGRAM COMPLETE
AR max vel: 1.31 cm2
AV Area VTI: 1.33 cm2
AV Area mean vel: 1.22 cm2
AV Mean grad: 4 mmHg
AV Peak grad: 8.8 mmHg
Ao pk vel: 1.48 m/s
Area-P 1/2: 3.25 cm2
S' Lateral: 3.27 cm

## 2022-03-11 LAB — CBC WITH DIFFERENTIAL/PLATELET
Abs Immature Granulocytes: 0.01 10*3/uL (ref 0.00–0.07)
Basophils Absolute: 0 10*3/uL (ref 0.0–0.1)
Basophils Relative: 1 %
Eosinophils Absolute: 0.1 10*3/uL (ref 0.0–0.5)
Eosinophils Relative: 2 %
HCT: 35 % — ABNORMAL LOW (ref 36.0–46.0)
Hemoglobin: 11.6 g/dL — ABNORMAL LOW (ref 12.0–15.0)
Immature Granulocytes: 0 %
Lymphocytes Relative: 20 %
Lymphs Abs: 0.7 10*3/uL (ref 0.7–4.0)
MCH: 31.8 pg (ref 26.0–34.0)
MCHC: 33.1 g/dL (ref 30.0–36.0)
MCV: 95.9 fL (ref 80.0–100.0)
Monocytes Absolute: 0.3 10*3/uL (ref 0.1–1.0)
Monocytes Relative: 9 %
Neutro Abs: 2.3 10*3/uL (ref 1.7–7.7)
Neutrophils Relative %: 68 %
Platelets: 126 10*3/uL — ABNORMAL LOW (ref 150–400)
RBC: 3.65 MIL/uL — ABNORMAL LOW (ref 3.87–5.11)
RDW: 13.5 % (ref 11.5–15.5)
WBC: 3.4 10*3/uL — ABNORMAL LOW (ref 4.0–10.5)
nRBC: 0 % (ref 0.0–0.2)

## 2022-03-11 LAB — BLOOD GAS, ARTERIAL
Acid-Base Excess: 4.2 mmol/L — ABNORMAL HIGH (ref 0.0–2.0)
Acid-Base Excess: 6 mmol/L — ABNORMAL HIGH (ref 0.0–2.0)
Bicarbonate: 33.5 mmol/L — ABNORMAL HIGH (ref 20.0–28.0)
Bicarbonate: 34.7 mmol/L — ABNORMAL HIGH (ref 20.0–28.0)
Delivery systems: POSITIVE
Expiratory PAP: 5 cmH2O
FIO2: 35 %
Inspiratory PAP: 18 cmH2O
O2 Content: 1.5 L/min
O2 Saturation: 96.5 %
O2 Saturation: 98.8 %
PEEP: 0 cmH2O
Patient temperature: 37
Patient temperature: 37
Spontaneous VT: 347 mL
pCO2 arterial: 73 mmHg (ref 32–48)
pCO2 arterial: 69 mmHg (ref 32–48)
pH, Arterial: 7.27 — ABNORMAL LOW (ref 7.35–7.45)
pH, Arterial: 7.31 — ABNORMAL LOW (ref 7.35–7.45)
pO2, Arterial: 75 mmHg — ABNORMAL LOW (ref 83–108)
pO2, Arterial: 89 mmHg (ref 83–108)

## 2022-03-11 LAB — TROPONIN I (HIGH SENSITIVITY)
Troponin I (High Sensitivity): 6 ng/L (ref <18)
Troponin I (High Sensitivity): 9 ng/L (ref <18)

## 2022-03-11 LAB — COMPREHENSIVE METABOLIC PANEL
ALT: 14 U/L (ref 0–44)
AST: 18 U/L (ref 15–41)
Albumin: 3.4 g/dL — ABNORMAL LOW (ref 3.5–5.0)
Alkaline Phosphatase: 42 U/L (ref 38–126)
Anion gap: 8 (ref 5–15)
BUN: 13 mg/dL (ref 8–23)
CO2: 27 mmol/L (ref 22–32)
Calcium: 8 mg/dL — ABNORMAL LOW (ref 8.9–10.3)
Chloride: 110 mmol/L (ref 98–111)
Creatinine, Ser: 0.81 mg/dL (ref 0.44–1.00)
GFR, Estimated: >60 mL/min (ref >60)
Glucose, Bld: 97 mg/dL (ref 70–99)
Potassium: 3 mmol/L — ABNORMAL LOW (ref 3.5–5.1)
Sodium: 145 mmol/L (ref 135–145)
Total Bilirubin: 0.8 mg/dL (ref 0.3–1.2)
Total Protein: 5.7 g/dL — ABNORMAL LOW (ref 6.5–8.1)

## 2022-03-11 LAB — HEMOGLOBIN A1C
Hgb A1c MFr Bld: 4.6 % — ABNORMAL LOW (ref 4.8–5.6)
Mean Plasma Glucose: 85.32 mg/dL

## 2022-03-11 LAB — MAGNESIUM
Magnesium: 1.9 mg/dL (ref 1.7–2.4)
Magnesium: 2 mg/dL (ref 1.7–2.4)

## 2022-03-11 LAB — PROTIME-INR
INR: 1.1 (ref 0.8–1.2)
Prothrombin Time: 14.5 seconds (ref 11.4–15.2)

## 2022-03-11 LAB — TSH: TSH: 1.67 u[IU]/mL (ref 0.350–4.500)

## 2022-03-11 LAB — CBG MONITORING, ED
Glucose-Capillary: 74 mg/dL (ref 70–99)
Glucose-Capillary: 98 mg/dL (ref 70–99)

## 2022-03-11 LAB — GLUCOSE, CAPILLARY
Glucose-Capillary: 87 mg/dL (ref 70–99)
Glucose-Capillary: 90 mg/dL (ref 70–99)

## 2022-03-11 LAB — PHOSPHORUS: Phosphorus: 3.5 mg/dL (ref 2.5–4.6)

## 2022-03-11 LAB — PROCALCITONIN: Procalcitonin: <0.1 ng/mL

## 2022-03-11 LAB — BRAIN NATRIURETIC PEPTIDE: B Natriuretic Peptide: 426.8 pg/mL — ABNORMAL HIGH (ref 0.0–100.0)

## 2022-03-11 MED ORDER — PRAMIPEXOLE DIHYDROCHLORIDE 0.25 MG PO TABS
0.2500 mg | ORAL_TABLET | Freq: Every day | ORAL | Status: DC
  Administered 2022-03-12 – 2022-03-16 (×5): 0.25 mg via ORAL
  Filled 2022-03-11 (×6): qty 1

## 2022-03-11 MED ORDER — ESCITALOPRAM OXALATE 10 MG PO TABS
20.0000 mg | ORAL_TABLET | Freq: Every day | ORAL | Status: DC
  Administered 2022-03-12 – 2022-03-16 (×5): 20 mg via ORAL
  Filled 2022-03-11: qty 1
  Filled 2022-03-11: qty 2
  Filled 2022-03-11: qty 1
  Filled 2022-03-11 (×2): qty 2

## 2022-03-11 MED ORDER — PANTOPRAZOLE SODIUM 40 MG PO TBEC: 40.0000 mg | DELAYED_RELEASE_TABLET | Freq: Every day | ORAL | Status: DC

## 2022-03-11 MED ORDER — VITAMIN B-12 1000 MCG PO TABS
500.0000 ug | ORAL_TABLET | Freq: Every day | ORAL | Status: DC
Start: 2022-03-11 — End: 2022-03-16
  Administered 2022-03-13 – 2022-03-16 (×4): 500 ug via ORAL
  Filled 2022-03-11: qty 0.5
  Filled 2022-03-11 (×4): qty 1

## 2022-03-11 MED ORDER — PRAVASTATIN SODIUM 20 MG PO TABS
20.0000 mg | ORAL_TABLET | Freq: Every day | ORAL | Status: DC
  Administered 2022-03-12 – 2022-03-16 (×5): 20 mg via ORAL
  Filled 2022-03-11 (×5): qty 1

## 2022-03-11 MED ORDER — FUROSEMIDE 10 MG/ML IJ SOLN
40.0000 mg | Freq: Every day | INTRAMUSCULAR | Status: DC
Start: 2022-03-11 — End: 2022-03-11
  Administered 2022-03-11: 40 mg via INTRAVENOUS
  Filled 2022-03-11: qty 4

## 2022-03-11 MED ORDER — TOFACITINIB CITRATE 5 MG PO TABS: 1.0000 | ORAL_TABLET | Freq: Two times a day (BID) | ORAL | Status: DC

## 2022-03-11 MED ORDER — METAMUCIL PO WAFR: WAFER | Freq: Every day | ORAL | Status: DC

## 2022-03-11 MED ORDER — FOLIC ACID 1 MG PO TABS
1.0000 mg | ORAL_TABLET | Freq: Every day | ORAL | Status: DC
  Administered 2022-03-13 – 2022-03-16 (×4): 1 mg via ORAL
  Filled 2022-03-11 (×4): qty 1

## 2022-03-11 MED ORDER — BUDESONIDE 0.5 MG/2ML IN SUSP
2.0000 mL | Freq: Every day | RESPIRATORY_TRACT | Status: DC
  Administered 2022-03-12 – 2022-03-16 (×5): 0.5 mg via RESPIRATORY_TRACT
  Filled 2022-03-11 (×6): qty 2

## 2022-03-11 MED ORDER — LAMOTRIGINE 25 MG PO TABS
100.0000 mg | ORAL_TABLET | Freq: Every day | ORAL | Status: DC
  Administered 2022-03-12 – 2022-03-16 (×5): 100 mg via ORAL
  Filled 2022-03-11: qty 1
  Filled 2022-03-11 (×4): qty 4

## 2022-03-11 MED ORDER — FUROSEMIDE 10 MG/ML IJ SOLN
40.0000 mg | Freq: Two times a day (BID) | INTRAMUSCULAR | Status: AC
  Administered 2022-03-11: 40 mg via INTRAVENOUS
  Filled 2022-03-11: qty 4

## 2022-03-11 MED ORDER — INSULIN ASPART 100 UNIT/ML IJ SOLN
0.0000 [IU] | INTRAMUSCULAR | Status: DC
  Administered 2022-03-12: 3 [IU] via SUBCUTANEOUS
  Administered 2022-03-12 – 2022-03-13 (×2): 2 [IU] via SUBCUTANEOUS
  Administered 2022-03-14: 3 [IU] via SUBCUTANEOUS
  Administered 2022-03-15 – 2022-03-16 (×3): 2 [IU] via SUBCUTANEOUS
  Filled 2022-03-11 (×7): qty 1

## 2022-03-11 MED ORDER — METHOTREXATE 2.5 MG PO TABS: 2.5000 mg | ORAL_TABLET | ORAL | Status: DC

## 2022-03-11 MED ORDER — ONDANSETRON HCL 4 MG/2ML IJ SOLN: 4.0000 mg | INTRAMUSCULAR | Status: DC

## 2022-03-11 MED ORDER — CEFAZOLIN SODIUM-DEXTROSE 2-4 GM/100ML-% IV SOLN: 2.0000 g | INTRAVENOUS | Status: DC

## 2022-03-11 MED ORDER — MIRTAZAPINE 15 MG PO TABS
15.0000 mg | ORAL_TABLET | Freq: Every day | ORAL | Status: DC
  Administered 2022-03-11 – 2022-03-12 (×2): 15 mg via ORAL
  Filled 2022-03-11 (×2): qty 1

## 2022-03-11 MED ORDER — TOPIRAMATE 25 MG PO TABS
25.0000 mg | ORAL_TABLET | Freq: Every day | ORAL | Status: DC
  Administered 2022-03-11 – 2022-03-15 (×5): 25 mg via ORAL
  Filled 2022-03-11 (×6): qty 1

## 2022-03-11 MED ORDER — POTASSIUM CHLORIDE 10 MEQ/100ML IV SOLN
10.0000 meq | INTRAVENOUS | Status: AC
  Administered 2022-03-11 (×3): 10 meq via INTRAVENOUS
  Filled 2022-03-11 (×3): qty 100

## 2022-03-11 MED ORDER — ACETAMINOPHEN 500 MG PO TABS
1000.0000 mg | ORAL_TABLET | Freq: Four times a day (QID) | ORAL | Status: DC | PRN
  Administered 2022-03-12 – 2022-03-14 (×5): 1000 mg via ORAL
  Filled 2022-03-11 (×5): qty 2

## 2022-03-11 MED ORDER — PREGABALIN 50 MG PO CAPS
100.0000 mg | ORAL_CAPSULE | Freq: Two times a day (BID) | ORAL | Status: DC
  Administered 2022-03-11 – 2022-03-16 (×10): 100 mg via ORAL
  Filled 2022-03-11 (×10): qty 2

## 2022-03-11 MED ORDER — BUSPIRONE HCL 10 MG PO TABS
10.0000 mg | ORAL_TABLET | Freq: Two times a day (BID) | ORAL | Status: DC
  Administered 2022-03-11 – 2022-03-16 (×10): 10 mg via ORAL
  Filled 2022-03-11 (×10): qty 1

## 2022-03-11 MED ORDER — NALOXONE HCL 2 MG/2ML IJ SOSY
0.4000 mg | PREFILLED_SYRINGE | Freq: Once | INTRAMUSCULAR | Status: AC
  Administered 2022-03-11: 0.4 mg via INTRAVENOUS
  Filled 2022-03-11: qty 2

## 2022-03-11 MED ORDER — ALBUTEROL SULFATE (2.5 MG/3ML) 0.083% IN NEBU
3.0000 mL | INHALATION_SOLUTION | Freq: Four times a day (QID) | RESPIRATORY_TRACT | Status: DC | PRN
Start: 2022-03-11 — End: 2022-03-16

## 2022-03-11 MED ORDER — ONDANSETRON HCL 4 MG/2ML IJ SOLN: 4.0000 mg | Freq: Four times a day (QID) | INTRAMUSCULAR | Status: DC | PRN

## 2022-03-11 MED ORDER — ACETAMINOPHEN 500 MG PO TABS
500.0000 mg | ORAL_TABLET | Freq: Three times a day (TID) | ORAL | Status: DC | PRN
Start: 2022-03-11 — End: 2022-03-11
  Administered 2022-03-11: 500 mg via ORAL
  Filled 2022-03-11: qty 1

## 2022-03-11 MED ORDER — FAMOTIDINE 20 MG PO TABS
20.0000 mg | ORAL_TABLET | Freq: Every day | ORAL | Status: DC
  Administered 2022-03-12 – 2022-03-16 (×5): 20 mg via ORAL
  Filled 2022-03-11 (×5): qty 1

## 2022-03-11 MED ORDER — FLUTICASONE PROPIONATE 50 MCG/ACT NA SUSP: 2.0000 | Freq: Two times a day (BID) | NASAL | Status: DC | PRN

## 2022-03-11 MED ORDER — TRAZODONE HCL 100 MG PO TABS
100.0000 mg | ORAL_TABLET | Freq: Every day | ORAL | Status: DC
  Administered 2022-03-11 – 2022-03-12 (×2): 100 mg via ORAL
  Filled 2022-03-11 (×2): qty 1

## 2022-03-11 MED ORDER — LACTATED RINGERS IV BOLUS
1000.0000 mL | Freq: Once | INTRAVENOUS | Status: AC
  Administered 2022-03-11: 1000 mL via INTRAVENOUS

## 2022-03-11 MED ORDER — VITAMIN D 25 MCG (1000 UNIT) PO TABS
1000.0000 [IU] | ORAL_TABLET | Freq: Every day | ORAL | Status: DC
  Administered 2022-03-13 – 2022-03-16 (×4): 1000 [IU] via ORAL
  Filled 2022-03-11 (×4): qty 1

## 2022-03-11 MED ORDER — METHOCARBAMOL 1000 MG/10ML IJ SOLN
500.0000 mg | Freq: Four times a day (QID) | INTRAVENOUS | Status: DC | PRN
  Administered 2022-03-11: 500 mg via INTRAVENOUS
  Filled 2022-03-11: qty 500
  Filled 2022-03-11: qty 5

## 2022-03-11 MED ORDER — SODIUM CHLORIDE 0.9 % IV SOLN: INTRAVENOUS | Status: DC

## 2022-03-11 MED ORDER — ONDANSETRON HCL 4 MG/2ML IJ SOLN
4.0000 mg | INTRAMUSCULAR | Status: AC
  Administered 2022-03-11: 4 mg via INTRAVENOUS
  Filled 2022-03-11: qty 2

## 2022-03-11 MED ORDER — ONDANSETRON HCL 4 MG PO TABS: 4.0000 mg | ORAL_TABLET | Freq: Four times a day (QID) | ORAL | Status: DC | PRN

## 2022-03-11 MED ORDER — MORPHINE SULFATE (PF) 4 MG/ML IV SOLN
4.0000 mg | Freq: Once | INTRAVENOUS | Status: AC
  Administered 2022-03-11: 4 mg via INTRAVENOUS
  Filled 2022-03-11: qty 1

## 2022-03-11 NOTE — Assessment & Plan Note (Signed)
Hold oral hypoglycemic agents Check blood sugars every 4 hours while patient is n.p.o.

## 2022-03-11 NOTE — Assessment & Plan Note (Addendum)
Secondary to diuretic therapy Supplement potassium Check magnesium level

## 2022-03-11 NOTE — H&P (Addendum)
History and Physical    Patient: Meghan [name] SMO:[id number] DOB: [birth year] DOA: 03/11/2022 DOS: the patient was seen and examined on 03/11/2022 PCP: Garwin Brothers, MD  Patient coming from: SNF  Chief Complaint:  Chief Complaint  Patient presents with   Fall   Ankle Pain   Hip Pain   Most of the history was obtained from the EMR as patient is very lethargic and cannot provide any history. HPI: Meghan [name] is a 76 y.o. female with medical history significant for rheumatoid arthritis, COPD, coronary artery disease, hypertension, dyslipidemia, status post pacemaker insertion for complete heart block who presented to the ER via EMS for evaluation following a mechanical fall. Most of the history was obtained from the chart as patient is very lethargic and unable to provide any history. Patient was said to have gotten up to use the bathroom and fell.  There was no loss of consciousness and she denied feeling dizzy or lightheaded prior to the fall.  She noted severe pain in her right ankle as well as right hip following the fall and was found to have a hematoma. She was brought into the ER and was noted to have severe deformity involving her right ankle but was able to move her toes. Patient had received IV fentanyl for pain control by EMS, she received IV morphine in the ER and was said to have taken some of her oxycodone at home for pain control. Patient's ankle fracture was reduced in the ER and orthopedic surgery was consulted for further evaluation. During my evaluation she is noted to be very lethargic but arouses to verbal stimuli and is unable to stay awake to provide any history. I am unable to do a review of systems on this patient at this time    Review of Systems: unable to review all systems due to the inability of the patient to answer questions. Past Medical History:  Diagnosis Date   Anginal pain ([place])    Anxiety    Arthritis    RA   Asthma    Brain tumor (benign) (HCC)     CHF (congestive heart failure) (HCC)    Collagen vascular disease (HCC)    Complete heart block (HCC)    COPD (chronic obstructive pulmonary disease) (HCC)    Coronary artery disease    DDD (degenerative disc disease)    Depression    Diabetes mellitus    GERD (gastroesophageal reflux disease)    Headache    Heart murmur    Hypercholesteremia    Hypertension    Lumbar degenerative disc disease    Migraines    Obesity    Osteopenia    Osteoporosis    Pacemaker- [employer]    Pneumonia    PTSD (post-traumatic stress disorder)    Past Surgical History:  Procedure Laterality Date   ABDOMINAL HYSTERECTOMY     CERVICAL FUSION     CHOLECYSTECTOMY     COLONOSCOPY WITH PROPOFOL N/A 09/20/2015   Procedure: COLONOSCOPY WITH PROPOFOL;  Surgeon: Josefine Class, MD;  [facility] ENDOSCOPY;  Service: Endoscopy;  Laterality: N/A;   COLONOSCOPY WITH PROPOFOL N/A 01/27/2016   Procedure: COLONOSCOPY WITH PROPOFOL;  Surgeon: Manya Silvas, MD;  Location: [facility] ENDOSCOPY;  Service: Endoscopy;  Laterality: N/A;   FINGER ARTHROPLASTY  02/23/2012   Procedure: FINGER ARTHROPLASTY;  Surgeon: Cammie Sickle., MD;  Location: [facility];  Service: Orthopedics;  Laterality: Left;  Extensor carpi radialis longus to Extensor carpi  ulnaris transfer, left Metaphalangeal reconstructions of index and long fingers,   FOOT ARTHROPLASTY     toes x2 rt foot   HAND RECONSTRUCTION  2011   right-multiple finger joint reconst   JOINT REPLACEMENT     bilat knee replacements   KYPHOPLASTY N/A 06/14/2017   Procedure: KYPHOPLASTY L1;  Surgeon: Hessie Knows, MD;  Location: [facility] ORS;  Service: Orthopedics;  Laterality: N/A;   LOOP RECORDER INSERTION N/A 06/23/2017   Procedure: LOOP RECORDER INSERTION;  Surgeon: Isaias Cowman, MD;  Location: [place] CV LAB;  Service: Cardiovascular;  Laterality: N/A;   LOOP RECORDER REMOVAL N/A 10/21/2018   Procedure: LOOP RECORDER REMOVAL;  Surgeon:  Deboraha Sprang, MD;  Location: [place] CV LAB;  Service: Cardiovascular;  Laterality: N/A;   ORIF FEMUR FRACTURE Right 10/22/2018   Procedure: OPEN REDUCTION INTERNAL FIXATION (ORIF) DISTAL FEMUR FRACTURE;  Surgeon: Marchia Bond, MD;  Location: [facility];  Service: Orthopedics;  Laterality: Right;   PACEMAKER IMPLANT N/A 10/21/2018   Procedure: PACEMAKER IMPLANT;  Surgeon: Deboraha Sprang, MD;  Location: [place] CV LAB;  Service: Cardiovascular;  Laterality: N/A;   Social History:  reports that she has never smoked. She has never used smokeless tobacco. She reports that she does not drink alcohol and does not use drugs.  Allergies  Allergen Reactions   Amoxicillin Itching and Other (See Comments)    Has patient had a PCN reaction causing immediate rash, facial/tongue/throat swelling, SOB or lightheadedness with hypotension: Unknown Has patient had a PCN reaction causing severe rash involving mucus membranes or skin necrosis: Unknown Has patient had a PCN reaction that required hospitalization: Unknown Has patient had a PCN reaction occurring within the last 10 years: Unknown If all of the above answers are "NO", then may proceed with Cephalosporin use.    Gabapentin Other (See Comments)    Pt states that it causes her BP to drop.    Iodine Other (See Comments)    Reaction:  Syncopy   Naproxen Hives   Nsaids Other (See Comments)    Reaction:  Unknown    Tolmetin Other (See Comments)    Reaction:  Unknown     Family History  Problem Relation Age of Onset   Depression Sister    Migraines Sister    Hypertension Mother    Arthritis/Rheumatoid Mother    Heart attack Father    Hypertension Father    CAD Other    Hypertension Other    Diabetes Mellitus II Other    Arthritis Other     Prior to Admission medications   Medication Sig Start Date End Date Taking? Authorizing Provider  acetaminophen (TYLENOL) 500 MG tablet Take 1 tablet by mouth 3 (three) times daily with meals as  needed. 08/20/21   [provider]  albuterol (VENTOLIN HFA) 108 (90 Base) MCG/ACT inhaler Inhale 1-2 puffs into the lungs every 6 (six) hours as needed for wheezing or shortness of breath.    [provider]  budesonide (PULMICORT) 0.5 MG/2ML nebulizer solution Inhale 2 mLs into the lungs daily. 11/29/18   [provider]  busPIRone (BUSPAR) 10 MG tablet Take 10 mg by mouth 2 (two) times daily. 12/28/21   [provider]  Calcium Carb-Cholecalciferol (CALCIUM 500/D) 500-400 MG-UNIT CHEW Chew by mouth daily.    [provider]  calcium carbonate (OS-CAL - DOSED IN MG OF ELEMENTAL CALCIUM) 1250 (500 Ca) MG tablet Take 2 tablets by mouth every 2 (two) hours as needed. 01/14/22  [provider]  Cholecalciferol (VITAMIN D3) 125 MCG (5000 UT) CAPS Take 1 capsule (5,000 Units total) by mouth daily with breakfast. Take along with calcium and magnesium. 01/12/22 04/12/22  Milinda Pointer, MD  Cholecalciferol 1.25 MG (50000 UT) TABS Take by mouth once a week.    [provider]  ciclopirox (LOPROX) 0.77 % cream Apply 1 g topically daily. 08/15/21   [provider]  cyanocobalamin 500 MCG tablet Take 500 mcg by mouth daily.    [provider]  diclofenac sodium (VOLTAREN) 1 % GEL Apply 2 g topically 3 (three) times daily. Apply to the fingers    [provider]  ergocalciferol (VITAMIN D2) 1.25 MG (50000 UT) capsule Take 1 capsule (50,000 Units total) by mouth 2 (two) times a week. X 6 weeks. 01/12/22 02/23/22  Milinda Pointer, MD  escitalopram (LEXAPRO) 20 MG tablet Take 20 mg by mouth daily. 12/09/21   [provider]  famotidine (PEPCID) 40 MG tablet Take 40 mg by mouth daily. 12/02/21   [provider]  fluticasone (VERAMYST) 27.5 MCG/SPRAY nasal spray Place 2 sprays into the nose 2 (two) times daily.    [provider]  folic acid (FOLVITE) 1 MG tablet Take 1 mg by mouth daily.    [provider]  furosemide (LASIX) 40 MG tablet Take 40 mg by mouth 2 (two) times daily.    [provider]  lamoTRIgine (LAMICTAL) 100 MG tablet Take 1 tablet (100 mg total) by mouth daily. 06/02/16   Rainey Pines, MD  lisinopril (ZESTRIL) 2.5 MG tablet Take 1 tablet by mouth daily.    [provider]  Carma Leaven REFILL KIT 25 MCG/ML SOLN SMARTSIG:1 Vial(s) Via Nebulizer Every 12 Hours PRN 07/16/21   [provider]  loperamide (IMODIUM A-D) 2 MG tablet Take 1 tablet (2 mg total) by mouth 4 (four) times daily as needed for diarrhea or loose stools. Patient taking differently: Take 2 mg by mouth as needed for diarrhea or loose stools. 05/10/17   Eula Listen, MD  loratadine (CLARITIN) 10 MG tablet Take 10 mg by mouth daily.    [provider]  LYRICA 100 MG capsule Take 100 mg by mouth 2 (two) times daily. 12/17/21   [provider]  methotrexate (RHEUMATREX) 2.5 MG tablet 1 tablet daily. 11/18/18   [provider]  mirtazapine (REMERON) 15 MG tablet Take 15 mg by mouth at bedtime. 02/10/22   [provider]  mirtazapine (REMERON) 7.5 MG tablet Take 1 tablet by mouth at bedtime.    [provider]  [facility] powder Apply topically. 02/05/22   [provider]  omeprazole (PRILOSEC) 20 MG capsule Take 20 mg by mouth daily.     [provider]  oxycodone (OXY-IR) 5 MG capsule Take 5 mg by mouth every 4 (four) hours as needed.    [provider]  Potassium Chloride ER 20 MEQ TBCR Take 20 mEq by mouth 2 (two) times daily. 05/07/17   Vaughan Basta, MD  pramipexole (MIRAPEX) 0.25 MG tablet Take 0.25 mg by mouth daily.     [provider]  pravastatin (PRAVACHOL) 20 MG tablet Take 1 tablet by mouth daily. 12/06/19   [provider]  promethazine (PHENERGAN) 25 MG tablet Take 1 tablet by mouth daily. 12/24/19   [provider]  Psyllium (METAMUCIL) WAFR Take 2 g by mouth  daily as needed. 11/11/21   [provider]  saxagliptin HCl (ONGLYZA) 5 MG TABS tablet  Take 1 tablet by mouth daily.    [provider]  Tofacitinib Citrate (XELJANZ) 5 MG TABS Take 1 tablet by mouth 2 (two) times daily. 03/03/21   [provider]  topiramate (TOPAMAX) 25 MG tablet Take 25 mg by mouth at bedtime.     [provider]  traZODone (DESYREL) 100 MG tablet Take 1 tablet by mouth daily. 09/21/21   [provider]    Physical Exam: Vitals:   03/11/22 1130 03/11/22 1145 03/11/22 1200 03/11/22 1246  BP: 123/70 110/64 97/70   Pulse: (!) 104 (!) 102 (!) 101 100  Resp: (!) 22 (!) 23 (!) 21 18  Temp:      TempSrc:      SpO2: 100% 100% 100% 100%   Physical Exam Vitals and nursing note reviewed.  Constitutional:      Comments: Lethargic.  Opens eyes to loud verbal stimuli  HENT:     Head: Normocephalic and atraumatic.     Comments: Hematoma    Nose: Nose normal.     Mouth/Throat:     Mouth: Mucous membranes are moist.  Eyes:     Conjunctiva/sclera: Conjunctivae normal.     Comments: Pinpoint pupils  Cardiovascular:     Rate and Rhythm: Tachycardia present.  Pulmonary:     Effort: Pulmonary effort is normal.     Breath sounds: Normal breath sounds.  Abdominal:     General: Abdomen is flat. Bowel sounds are normal.  Musculoskeletal:        General: Deformity present.     Cervical back: Normal range of motion and neck supple.     Comments: Decreased range of motion right ankle.  Right ankle immobilized.  Skin:    General: Skin is warm and dry.  Neurological:     Comments: Unable to assess  Psychiatric:     Comments: Unable to assess     Data Reviewed: Relevant notes from primary care and specialist visits, past discharge summaries as available in EHR, including Care Everywhere. Prior diagnostic testing as pertinent to current admission diagnoses Updated medications and problem lists for reconciliation ED course, including  vitals, labs, imaging, treatment and response to treatment Triage notes, nursing and pharmacy notes and ED provider's notes Notable results as noted in HPI Labs reviewed.  Troponin 6, sodium 145, potassium 3.0, chloride 110, bicarb 27, glucose 97, BUN 13, creatinine 0.81, calcium 8.0, total protein 5.7, albumin 3.4, AST 18, ALT 14, alk phos 42, PT 14.5, INR 1.1, white count 3.4, hemoglobin 11.6, hematocrit 35, platelet count 126 Right ankle x-ray shows acute, comminuted fracture deformities involve the distal diaphysis of the fibula and tibia. Lateral angulation of the distal fracture fragments.Soft tissue swelling. Right hip x-ray shows no acute findings. 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 Right ankle x-ray following reduction persistent comminuted impacted fractures of the distal right tibia and fibula.Persistent lateral displacement of the main distal fracture fragment of the tibia and approximately 1.3 cm impaction. Chest x-ray reviewed by me shows cardiomegaly with central pulmonary vascular congestion and suspected mild interstitial edema. Chronic elevation of the right hemidiaphragm. Small right pleural effusion. Underlying atelectasis and/or consolidation at the right lung base.Aortic Atherosclerosis  CT scan of the head without contrast/cervical spine CT shows no evidence of acute intracranial abnormality. Right posterior vertex scalp contusion without acute calvarial fracture. No evidence of acute fracture or traumatic malalignment in the cervical spine.  There are no new results to review at this time.  Assessment and  Plan: * Fall Status post mechanical fall with a right ankle fracture Place patient on fall precautions PT evaluation once stable  AMS (altered mental status) Acute metabolic encephalopathy Patient noted to be very lethargic during my assessment, will open eyes to loud verbal stimuli but unable to stay awake enough to provide any history. Mental  status changes appear to be secondary to opioids that patient received for pain control She is still able to protect her airway and is currently 1.5 L of oxygen to maintain pulse oximetry greater than 92% Place patient on end-tidal CO2 monitor Obtain stat arterial blood gas  Acute on chronic diastolic CHF (congestive heart failure) (HCC) Noted to have cardiomegaly and evidence of pulmonary vascular congestion on chest x-ray 2D echocardiogram from 05/23 shows an LVEF of about 55% with normal RV systolic function and severe valvular regurgitation. Continue diuretic therapy Hold lisinopril due to relative hypotension We will place patient on IV Lasix  Seropositive rheumatoid arthritis (HCC) Stable Continue weekly methotrexate and tofacitinib  Ankle fracture, right Status post mechanical fall with a right ankle fracture Imaging shows an acute, comminuted fracture deformities involve the distal diaphysis of the fibula and tibia. Lateral angulation of the distal fracture fragments. Soft tissue swelling. Patient is status post reduction in the ER and orthopedic surgery has been consulted for surgical repair Pain control Muscle relaxants Immobilize right lower extremity  Seizure ([place]) Stable Place patient on seizure precautions Continue Lamictal  HTN (hypertension) Patient is normotensive We will hold lisinopril for now  GERD (gastroesophageal reflux disease) Stable Continue Protonix  Diabetes mellitus ([place]) Hold oral hypoglycemic agents Check blood sugars every 4 hours while patient is n.p.o.  Hypokalemia Secondary to diuretic therapy Supplement potassium Check magnesium level  Pancytopenia (HCC) Most likely secondary to chronic illness Monitor closely during this hospitalization  Depression Stable Resume buspirone, mirtazapine and trazodone once able  Chronic obstructive pulmonary disease (HCC) Stable and not acutely exacerbated Continue inhaled steroids and as  needed bronchodilator therapy      Advance Care Planning:   Code Status: Full Code   Consults: Orthopedic surgery  Family Communication: Greater than 50% of time was spent discussing patient's condition and plan of care with her relative, Tenna Child over the phone.  All questions and concerns have been addressed.  She verbalizes understanding and agrees with the plan.  Severity of Illness: The appropriate patient status for this patient is INPATIENT. Inpatient status is judged to be reasonable and necessary in order to provide the required intensity of service to ensure the patient's safety. The patient's presenting symptoms, physical exam findings, and initial radiographic and laboratory data in the context of their chronic comorbidities is felt to place them at high risk for further clinical deterioration. Furthermore, it is not anticipated that the patient will be medically stable for discharge from the hospital within 2 midnights of admission.   * I certify that at the point of admission it is my clinical judgment that the patient will require inpatient hospital care spanning beyond 2 midnights from the point of admission due to high intensity of service, high risk for further deterioration and high frequency of surveillance required.*  Author: Collier Bullock, MD 03/11/2022 12:53 PM  For on call review [email or web address].

## 2022-03-11 NOTE — Assessment & Plan Note (Signed)
Status post mechanical fall with a right ankle fracture Place patient on fall precautions PT evaluation once stable

## 2022-03-11 NOTE — Assessment & Plan Note (Addendum)
Acute metabolic encephalopathy Patient noted to be very lethargic during my assessment, will open eyes to loud verbal stimuli but unable to stay awake enough to provide any history. Mental status changes appear to be secondary to opioids that patient received for pain control She is still able to protect her airway and is currently 1.5 L of oxygen to maintain pulse oximetry greater than 92% Place patient on end-tidal CO2 monitor Obtain stat arterial blood gas

## 2022-03-11 NOTE — Progress Notes (Signed)
Patient is alert and oriented. Has been taken off BIPAP at this time by respiratory. Currently on 3L of Oxygen [place]

## 2022-03-11 NOTE — Assessment & Plan Note (Signed)
Stable ?Continue Protonix ?

## 2022-03-11 NOTE — Consult Note (Signed)
[place] NOTE       Patient ID: Meghan [name] MRN: [id number] DOB/AGE: 03/12/1946 76 y.o.  Admit date: 03/11/2022 Referring Physician Dr. Francine Graven  Primary Physician  Primary Cardiologist Dr. Clayborn Bigness Reason for Consultation medical optimization prior to orthopedic surgery  HPI: Meghan [name] is a 76yoF with a PMH of complete heart block s/p Dual Chamber PPM 10/2018, HFpEF (LVEF >55%, g2dd), severe MR, COPD, type 2 diabetes, hypertension, morbid obesity who presented to [facility] ED the early morning hours of 03/11/2022 after a mechanical fall and was found to have a comminuted fracture of her right ankle.  Cardiology is consulted for medical optimization prior to orthopedic surgery.  The patient is on BiPAP so history is somewhat limited, majority of history is obtained from the chart.  She presented early this morning after a mechanical fall after she slipped and fell in the restroom.  She hit her head and complained of severe pain in her hip and right ankle which was obviously deformed on presentation.  On arrival she did not have any chest pain or shortness of breath and was comfortable on room air.  She was given fentanyl en route by EMS in addition to another 4 mg of IV morphine and Zofran in the ED early this morning for pain control prior to reduction of her ankle, became somewhat hypotensive and lethargic this morning thereafter, requiring a liter of IV fluids and Narcan with good response.  In my time of evaluation she has no complaints other than asking if she can urinate (has a pure wick on) she denies chest discomfort or shortness of breath and is laying flat in bed on BiPAP.  Vitals are notable for blood pressure 110/70 she is in a atrial sensed ventricular paced rhythm in the 100s on telemetry.  Labs are notable for hypokalemia to 3.0, BUN/creatinine 13/0.81, GFR greater than 60.  Magnesium 2.0 high-sensitivity troponin negative at 6-9.  BNP slightly elevated at 426.   Stable anemia with H&H 11.6/35, thrombocytopenia around baseline with platelets 126.  Chest x-ray with central pulmonary vascular congestion and a small right pleural effusion.  Review of systems complete and found to be negative unless listed above     Past Medical History:  Diagnosis Date   Anginal pain ([place])    Anxiety    Arthritis    RA   Asthma    Brain tumor (benign) (HCC)    CHF (congestive heart failure) (HCC)    Collagen vascular disease (HCC)    Complete heart block (HCC)    COPD (chronic obstructive pulmonary disease) (HCC)    Coronary artery disease    DDD (degenerative disc disease)    Depression    Diabetes mellitus    GERD (gastroesophageal reflux disease)    Headache    Heart murmur    Hypercholesteremia    Hypertension    Lumbar degenerative disc disease    Migraines    Obesity    Osteopenia    Osteoporosis    Pacemaker- [employer] Jude    Pneumonia    PTSD (post-traumatic stress disorder)     Past Surgical History:  Procedure Laterality Date   ABDOMINAL HYSTERECTOMY     CERVICAL FUSION     CHOLECYSTECTOMY     COLONOSCOPY WITH PROPOFOL N/A 09/20/2015   Procedure: COLONOSCOPY WITH PROPOFOL;  Surgeon: Josefine Class, MD;  Location: [facility] ENDOSCOPY;  Service: Endoscopy;  Laterality: N/A;   COLONOSCOPY WITH PROPOFOL N/A 01/27/2016   Procedure: COLONOSCOPY  WITH PROPOFOL;  Surgeon: Manya Silvas, MD;  Location: [facility] ENDOSCOPY;  Service: Endoscopy;  Laterality: N/A;   FINGER ARTHROPLASTY  02/23/2012   Procedure: FINGER ARTHROPLASTY;  Surgeon: Cammie Sickle., MD;  Location: [place];  Service: Orthopedics;  Laterality: Left;  Extensor carpi radialis longus to Extensor carpi ulnaris transfer, left Metaphalangeal reconstructions of index and long fingers,   FOOT ARTHROPLASTY     toes x2 rt foot   HAND RECONSTRUCTION  2011   right-multiple finger joint reconst   JOINT REPLACEMENT     bilat knee replacements   KYPHOPLASTY N/A 06/14/2017    Procedure: KYPHOPLASTY L1;  Surgeon: Hessie Knows, MD;  Location: [facility] ORS;  Service: Orthopedics;  Laterality: N/A;   LOOP RECORDER INSERTION N/A 06/23/2017   Procedure: LOOP RECORDER INSERTION;  Surgeon: Isaias Cowman, MD;  Location: [place] CV LAB;  Service: Cardiovascular;  Laterality: N/A;   LOOP RECORDER REMOVAL N/A 10/21/2018   Procedure: LOOP RECORDER REMOVAL;  Surgeon: Deboraha Sprang, MD;  Location: [place] CV LAB;  Service: Cardiovascular;  Laterality: N/A;   ORIF FEMUR FRACTURE Right 10/22/2018   Procedure: OPEN REDUCTION INTERNAL FIXATION (ORIF) DISTAL FEMUR FRACTURE;  Surgeon: Marchia Bond, MD;  Location: [place];  Service: Orthopedics;  Laterality: Right;   PACEMAKER IMPLANT N/A 10/21/2018   Procedure: PACEMAKER IMPLANT;  Surgeon: Deboraha Sprang, MD;  Location: [place] CV LAB;  Service: Cardiovascular;  Laterality: N/A;    (Not in a hospital admission)  Social History   Socioeconomic History   Marital status: Divorced    Spouse name: Not on file   Number of children: Not on file   Years of education: Not on file   Highest education level: Not on file  Occupational History   Occupation: retired  Tobacco Use   Smoking status: Never   Smokeless tobacco: Never  Vaping Use   Vaping Use: Never used  Substance and Sexual Activity   Alcohol use: No    Alcohol/week: 0.0 standard drinks of alcohol   Drug use: No   Sexual activity: Never  Other Topics Concern   Not on file  Social History Narrative   Not on file   Social Determinants of Health   Financial Resource Strain: Not on file  Food Insecurity: Not on file  Transportation Needs: Not on file  Physical Activity: Not on file  Stress: Not on file  Social Connections: Not on file  Intimate Partner Violence: Not on file    Family History  Problem Relation Age of Onset   Depression Sister    Migraines Sister    Hypertension Mother    Arthritis/Rheumatoid Mother    Heart attack Father     Hypertension Father    CAD Other    Hypertension Other    Diabetes Mellitus II Other    Arthritis Other      PHYSICAL EXAM General: Elderly and obese Caucasian female, well nourished, i laying flat in ED stretcher on BiPAP  HEENT:  Normocephalic and atraumatic. Neck:  No JVD.  Lungs: Able to speak in short phrases on BiPAP. clear bilaterally to auscultation anteriorly Heart: Tachycardic but regular, paced rhythm. Normal S1 and S2 with a 4/6 systolic murmur present throughout. Radial & DP pulses 2+ bilaterally. Abdomen: Non-distended appearing.  Msk: Normal strength and tone for age. Extremities: Left lower extremity without appreciable edema, right lower extremity immobilized with Ace bandage in place  neuro: Alert and oriented X 3. Psych:  Answers  questions appropriately.   Labs:   Lab Results  Component Value Date   WBC 3.4 (L) 03/11/2022   HGB 11.6 (L) 03/11/2022   HCT 35.0 (L) 03/11/2022   MCV 95.9 03/11/2022   PLT 126 (L) 03/11/2022    Recent Labs  Lab 03/11/22 0509  NA 145  K 3.0*  CL 110  CO2 27  BUN 13  CREATININE 0.81  CALCIUM 8.0*  PROT 5.7*  BILITOT 0.8  ALKPHOS 42  ALT 14  AST 18  GLUCOSE 97   Lab Results  Component Value Date   CKTOTAL 186 06/10/2017   CKMB 2.6 11/29/2012   TROPONINI <0.03 10/21/2018    Lab Results  Component Value Date   CHOL 148 08/01/2012   Lab Results  Component Value Date   HDL 47 08/01/2012   Lab Results  Component Value Date   LDLCALC 80 08/01/2012   Lab Results  Component Value Date   TRIG 103 08/01/2012   No results found for: "CHOLHDL" No results found for: "LDLDIRECT"    Radiology: CT Head Wo Contrast  Result Date: 03/11/2022 CLINICAL DATA:  Head trauma, minor (Age >= 65y); Neck trauma (Age >= 65y) EXAM: CT HEAD WITHOUT CONTRAST CT CERVICAL SPINE WITHOUT CONTRAST TECHNIQUE: Multidetector CT imaging of the head and cervical spine was performed following the standard protocol without intravenous contrast.  Multiplanar CT image reconstructions of the cervical spine were also generated. RADIATION DOSE REDUCTION: This exam was performed according to the departmental dose-optimization program which includes automated exposure control, adjustment of the mA and/or kV according to patient size and/or use of iterative reconstruction technique. COMPARISON:  CT head and CT cervical spine October 16, 2020. 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 No evidence of acute large vascular territory infarct, mass lesion, midline shift, or acute hemorrhage. Vascular: No hyperdense vessel identified. Skull: Right posterior vertex scalp contusion without acute calvarial fracture. Sinuses/Orbits: Mild paranasal sinus mucosal thickening. No acute orbital findings. Other: No mastoid effusions. CT CERVICAL SPINE FINDINGS Alignment: Similar anterolisthesis of C4 on C5, C7 on T1 and T1 on T2. No new sagittal subluxation. Skull base and vertebrae: Vertebral body heights are maintained. No evidence of acute fracture. Soft tissues and spinal canal: No prevertebral fluid or swelling. No visible canal hematoma. Disc levels: Similar multilevel degenerative disc disease and multilevel facet/uncovertebral hypertrophy with varying degrees of neural foraminal stenosis. Upper chest: Visualized lung apices are clear. IMPRESSION: 1. No evidence of acute intracranial abnormality. 2. Right posterior vertex scalp contusion without acute calvarial fracture. 3. No evidence of acute fracture or traumatic malalignment in the cervical spine. Electronically Signed   By: Margaretha Sheffield M.D.   On: 03/11/2022 08:27   CT Cervical Spine Wo Contrast  Result Date: 03/11/2022 CLINICAL DATA:  Head trauma, minor (Age >= 65y); Neck trauma (Age >= 65y) EXAM: CT HEAD WITHOUT CONTRAST CT CERVICAL SPINE WITHOUT CONTRAST TECHNIQUE: Multidetector CT imaging of the head and cervical spine was performed following  the standard protocol without intravenous contrast. Multiplanar CT image reconstructions of the cervical spine were also generated. RADIATION DOSE REDUCTION: This exam was performed according to the departmental dose-optimization program which includes automated exposure control, adjustment of the mA and/or kV according to patient size and/or use of iterative reconstruction technique. COMPARISON:  CT head and CT cervical spine October 16, 2020. 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 No evidence of acute large vascular territory  infarct, mass lesion, midline shift, or acute hemorrhage. Vascular: No hyperdense vessel identified. Skull: Right posterior vertex scalp contusion without acute calvarial fracture. Sinuses/Orbits: Mild paranasal sinus mucosal thickening. No acute orbital findings. Other: No mastoid effusions. CT CERVICAL SPINE FINDINGS Alignment: Similar anterolisthesis of C4 on C5, C7 on T1 and T1 on T2. No new sagittal subluxation. Skull base and vertebrae: Vertebral body heights are maintained. No evidence of acute fracture. Soft tissues and spinal canal: No prevertebral fluid or swelling. No visible canal hematoma. Disc levels: Similar multilevel degenerative disc disease and multilevel facet/uncovertebral hypertrophy with varying degrees of neural foraminal stenosis. Upper chest: Visualized lung apices are clear. IMPRESSION: 1. No evidence of acute intracranial abnormality. 2. Right posterior vertex scalp contusion without acute calvarial fracture. 3. No evidence of acute fracture or traumatic malalignment in the cervical spine. Electronically Signed   By: Margaretha Sheffield M.D.   On: 03/11/2022 08:27   DG Chest Portable 1 View  Result Date: 03/11/2022 CLINICAL DATA:  Provided history: Status post reduction. Shortness of breath. EXAM: PORTABLE CHEST 1 VIEW COMPARISON:  Chest CT 10/02/2020. Prior chest radiographs 10/22/2018 and  earlier. FINDINGS: Left chest multi lead implantable cardiac device. Cardiomegaly. Aortic atherosclerosis. Central pulmonary vascular congestion with suspected mild interstitial edema. Chronic elevation of the right hemidiaphragm. Small right pleural effusion. Additional opacity at the right lung base which may reflect atelectasis and/or consolidation. No evidence of pneumothorax. No acute bony abnormality identified. IMPRESSION: Cardiomegaly with central pulmonary vascular congestion and suspected mild interstitial edema. Chronic elevation of the right hemidiaphragm. Small right pleural effusion. Underlying atelectasis and/or consolidation at the right lung base. Aortic Atherosclerosis (ICD10-I70.0). Electronically Signed   By: Kellie Simmering D.O.   On: 03/11/2022 08:15   DG Ankle 2 Views Right  Result Date: 03/11/2022 CLINICAL DATA:  Status post reduction. EXAM: RIGHT ANKLE - 2 VIEW COMPARISON:  March 11, 2022 FINDINGS: Redemonstrated is comminuted impacted fracture of the distal right tibia. Persistent lateral displacement of the main distal fracture fragment and approximately 1.3 cm impaction. Butterfly fragment is seen posterior to the oblique fracture line. Similar comminuted fracture of the distal right fibula with persistent impaction and angulation. Associated soft tissue swelling. Cast material overlies the soft tissues and obscures details. IMPRESSION: Persistent comminuted impacted fractures of the distal right tibia and fibula. Persistent lateral displacement of the main distal fracture fragment of the tibia and approximately 1.3 cm impaction. Electronically Signed   By: Fidela Salisbury M.D.   On: 03/11/2022 08:15   DG Hip Unilat W or Wo Pelvis 2-3 Views Right  Result Date: 03/11/2022 CLINICAL DATA:  Status post fall. EXAM: DG HIP (WITH OR WITHOUT PELVIS) 2-3V RIGHT COMPARISON:  05/24/21 FINDINGS: The bones appear osteopenic. Degenerative changes noted within the imaged portions of the lumbar  spine. Signs of previous IM nail fixation of the right femur. No signs of acute fracture or dislocation. Mild bilateral and symmetric osteoarthritis noted within the hips. IMPRESSION: 1. No acute findings. 2. 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 Electronically Signed   By: Kerby Moors M.D.   On: 03/11/2022 05:42   DG Ankle Complete Right  Result Date: 03/11/2022 CLINICAL DATA:  Status post fall. EXAM: RIGHT ANKLE - COMPLETE 3+ VIEW COMPARISON:  None Available. FINDINGS: The bones appear osteopenic. There is diffuse soft tissue swelling. Vascular calcifications are identified there are acute, comminuted fracture deformities involving the distal diaphysis of the fibula and tibia. Lateral angulation of the distal fracture fragments identified. Mild lateral  and dorsal displacement of the distal fracture fragments also noted. IMPRESSION: 1. Acute, comminuted fracture deformities involve the distal diaphysis of the fibula and tibia. Lateral angulation of the distal fracture fragments. 2. Soft tissue swelling. Electronically Signed   By: Kerby Moors M.D.   On: 03/11/2022 05:41    ECHO 12/30/2021 Jobe Gibbon, MD - 12/22/2021  Formatting of this note might be different from the original.                         [place], [place]                                    [id number]            [place] #: [id number]            [place], [place], [place] [address]       Date: 12/22/2021 02: 45 PM                                                               Adult   Female   Age: 43 yrs            ECHOCARDIOGRAM REPORT                              Outpatient                                                               KC^^KCWC       STUDY:CHEST WALL               TAPE:0000: 00: 0: 00: 00 MD1: Callwood, Dwayne, MD        ECHO:Yes    DOPPLER:Yes       FILE:0000-000-000         BP: 90/60 mmHg       COLOR:Yes   CONTRAST:No     MACHINE:Philips   RV BIOPSY:No          3D:No  SOUND QLTY:Moderate            Height: 55 in      MEDIUM:None                                              Weight: 147 lb  BSA: 1.5 m2  _________________________________________________________________________________________                HISTORY: DOE, Murmur                 REASON: Assess, LV function, Assess, Source of Murmur             INDICATION: R01.1 Cardiac murmur, unspecified  _________________________________________________________________________________________  ECHOCARDIOGRAPHIC MEASUREMENTS  2D DIMENSIONS  AORTA                  Values   Normal Range   MAIN PA         Values    Normal Range                Annulus: nm*          [2.1-2.5]         PA Main: nm*       [1.5-2.1]              Aorta Sin: 3.1 cm       [2.7-3.3]    RIGHT VENTRICLE            ST Junction: nm*          [2.3-2.9]         RV Base: 3.7 cm    [<4.2]              Asc.Aorta: nm*          [2.3-3.1]          RV Mid: nm*       [<3.5]  LEFT VENTRICLE                                      RV Length: nm*       [<8.6]                  LVIDd: 5.7 cm       [3.9-5.3]    INFERIOR VENA CAVA                  LVIDs: 3.7 cm                        Max. IVC: nm*       [<=2.1]                     FS: 34.8 %       [>25]            Min. IVC: nm*                    SWT: 0.78 cm      [0.5-0.9]    ------------------                    PWT: 0.77 cm      [0.5-0.9]    nm* - not measured  LEFT ATRIUM                LA Diam: 5.5 cm       [2.7-3.8]            LA A4C Area: nm*          [<20]              LA Volume: nm*          [22-52]  _________________________________________________________________________________________  ECHOCARDIOGRAPHIC DESCRIPTIONS  AORTIC ROOT                   Size: Normal             Dissection: INDETERM FOR DISSECTION  AORTIC VALVE                Leaflets: Tricuspid                   Morphology: MILDLY THICKENED               Mobility: Fully mobile  LEFT VENTRICLE                   Size: Normal                        Anterior: Normal            Contraction: Normal                         Lateral: Normal             Closest EF: >55% (Estimated)                Septal: Normal              LV Masses: No Masses                       Apical: Normal                    LVH: None                          Inferior: Normal                                                      Posterior: Normal           Dias.FxClass: (Grade 2) relaxation abnormal, pseudonormal  MITRAL VALVE               Leaflets: Normal                        Mobility: PARTIALLY MOBILE             Morphology: ANNULAR CALC  LEFT ATRIUM                   Size: MODERATELY ENLARGED          LA Masses: No masses              IA Septum: Normal IAS  MAIN PA                   Size: Normal  PULMONIC VALVE             Morphology: Normal                        Mobility: Fully mobile  RIGHT VENTRICLE              RV Masses: No Masses  Size: Normal              Free Wall: Normal                     Contraction: Normal  TRICUSPID VALVE               Leaflets: Normal                        Mobility: Fully mobile             Morphology: Normal  RIGHT ATRIUM                   Size: Normal                        RA Other: None                RA Mass: No masses  PERICARDIUM                  Fluid: No effusion  INFERIOR VENACAVA                   Size: Normal Normal respiratory collapse  _________________________________________________________________________________________   DOPPLER ECHO and OTHER SPECIAL PROCEDURES                 Aortic: TRIVIAL AR                 No AS                         154.6 cm/sec peak vel      9.6 mmHg peak grad                         5.0 mmHg mean grad         3.1 cm^2 by DOPPLER                 Mitral: SEVERE MR                   No MS                         MV Inflow E Vel = 149.0 cm/sec      MV Annulus E'Vel = 5.3 cm/sec                         E/E'Ratio = 28.0              Tricuspid: MODERATE TR                No TS                         359.7 cm/sec peak TR vel   54.8 mmHg peak RV pressure              Pulmonary: TRIVIAL PR                 No PS  _________________________________________________________________________________________  INTERPRETATION  NORMAL LEFT VENTRICULAR SYSTOLIC FUNCTION WITH AN ESTIMATED EF = >55 %  NORMAL RIGHT VENTRICULAR SYSTOLIC FUNCTION  SEVERE VALVULAR REGURGITATION (See above)  NO VALVULAR STENOSIS  BRIGHT, ECHOGENIC MASS ATTACHED TO THE ANTERIOR MITRAL VALVE LEAFLET (0.2 CM x 0.66 CM)  IS NOTED (SEE IMAGES 13-17, 63, 77 & 78)   TELEMETRY reviewed by me: Paced rhythm low 108  EKG reviewed by me: None available for review  ASSESSMENT AND PLAN:  Meghan [name] is a 50yoF with a PMH of complete heart block s/p Dual Chamber PPM 10/2018, HFpEF (LVEF >55%, g2dd), severe MR, COPD, type 2 diabetes, hypertension, morbid obesity who presented to [facility] ED the early morning hours of 03/11/2022 after a mechanical fall and was found to have a comminuted fracture of her right ankle.  Cardiology is consulted for medical optimization prior to orthopedic surgery.  #Comminuted right ankle fracture #Acute hypoxic and hypercarbic respiratory failure #Chronic HFpEF, severe MR The patient presents after a mechanical fall and was initially not complaining of shortness of breath and was stable on room air.  She received significant amounts of pain medication this morning, became lethargic and minimally responsive, requiring a liter of fluids, Narcan, and BiPAP for respiratory support to which she responded well.  Her BNP is slightly elevated in the 400s but does not appear grossly volume overloaded on exam.  She has known severe MR by echocardiogram in May of this year managed medically -Agree with IV Lasix 40  mg x 2 doses to help her respiratory status after receiving IV fluids and pain medications this morning -Monitor and replete electrolytes -Continue other GDMT with her home lisinopril 2.5 mg once daily and restart home Lasix 40 mg once daily likely tomorrow.  consider addition of beta-blocker as her blood pressure allows. -Echocardiogram complete performed this morning -After diuresis as above, she is okay to proceed with orthopedic surgery without further diagnostic testing with a mild to moderate risk from a cardiac standpoint.  This patient's plan of care was discussed and created with Dr. Clayborn Bigness and he is in agreement.  Signed: Tristan Schroeder , PA-C 03/11/2022, 10:59 AM [facility] Card[facility]

## 2022-03-11 NOTE — Progress Notes (Signed)
*  PRELIMINARY RESULTS* Echocardiogram 2D Echocardiogram has been performed.  Meghan [name] 03/11/2022, 1:27 PM

## 2022-03-11 NOTE — Assessment & Plan Note (Signed)
Patient is normotensive We will hold lisinopril for now

## 2022-03-11 NOTE — Progress Notes (Signed)
No improvement in patient's mental status and no response to loud verbal stimuli. Stat arterial blood gas shows uncompensated respiratory acidosis We will place patient on BiPAP Repeat ABG in 2 hours We will give patient 1 dose of Narcan

## 2022-03-11 NOTE — Assessment & Plan Note (Signed)
Stable and not acutely exacerbated Continue inhaled steroids and as needed bronchodilator therapy

## 2022-03-11 NOTE — Assessment & Plan Note (Signed)
Stable Place patient on seizure precautions Continue Lamictal

## 2022-03-11 NOTE — Consult Note (Signed)
Pulmonary Medicine          Date: 03/11/2022,   MRN# [id number] Meghan [name] [birth year]     Admission                  Current       CHIEF COMPLAINT:   Hypercapnia respiratory failure on bipap    HISTORY OF PRESENT ILLNESS   This is 76 year old lady, with hx of  rheumatoid arthritis, COPD, coronary artery disease, hypertension, dyslipidemia, status post pacemaker insertion for complete heart block who presented to the ER via EMS for evaluation. Apparently fell at home in the bathroom. . On arrival to the room she was on bipap, pco2 76, she was somnolent, unable to gave a hx, and the echo tech was completing an echo. Repeat pco2 downt o 69 mmHg. She fractured  her right fibula/tibia She is on oxycontin at home, thus far she had fentanyl and morphine. Narcon was given subsequently with some improvement in alertness.   Cxr shows mild pulm edema.  Right diaphragm elevation, small pl effusion, ? Right basal consolidation      PAST MEDICAL HISTORY   Past Medical History:  Diagnosis Date   Anginal pain ([place])    Anxiety    Arthritis    RA   Asthma    Brain tumor (benign) (HCC)    CHF (congestive heart failure) (HCC)    Collagen vascular disease (HCC)    Complete heart block (HCC)    COPD (chronic obstructive pulmonary disease) (HCC)    Coronary artery disease    DDD (degenerative disc disease)    Depression    Diabetes mellitus    GERD (gastroesophageal reflux disease)    Headache    Heart murmur    Hypercholesteremia    Hypertension    Lumbar degenerative disc disease    Migraines    Obesity    Osteopenia    Osteoporosis    Pacemaker- [employer]    Pneumonia    PTSD (post-traumatic stress disorder)      SURGICAL HISTORY   Past Surgical History:  Procedure Laterality Date   ABDOMINAL HYSTERECTOMY     CERVICAL FUSION     CHOLECYSTECTOMY     COLONOSCOPY WITH PROPOFOL N/A 09/20/2015   Procedure: COLONOSCOPY WITH PROPOFOL;  Surgeon: Josefine Class,  MD;  Location: [facility] ENDOSCOPY;  Service: Endoscopy;  Laterality: N/A;   COLONOSCOPY WITH PROPOFOL N/A 01/27/2016   Procedure: COLONOSCOPY WITH PROPOFOL;  Surgeon: Manya Silvas, MD;  Location: [facility] ENDOSCOPY;  Service: Endoscopy;  Laterality: N/A;   FINGER ARTHROPLASTY  02/23/2012   Procedure: FINGER ARTHROPLASTY;  Surgeon: Cammie Sickle., MD;  Location: [facility];  Service: Orthopedics;  Laterality: Left;  Extensor carpi radialis longus to Extensor carpi ulnaris transfer, left Metaphalangeal reconstructions of index and long fingers,   FOOT ARTHROPLASTY     toes x2 rt foot   HAND RECONSTRUCTION  2011   right-multiple finger joint reconst   JOINT REPLACEMENT     bilat knee replacements   KYPHOPLASTY N/A 06/14/2017   Procedure: KYPHOPLASTY L1;  Surgeon: Hessie Knows, MD;  Location: [facility] ORS;  Service: Orthopedics;  Laterality: N/A;   LOOP RECORDER INSERTION N/A 06/23/2017   Procedure: LOOP RECORDER INSERTION;  Surgeon: Isaias Cowman, MD;  Location: [place] [facility];  Service: Cardiovascular;  Laterality: N/A;   LOOP RECORDER REMOVAL N/A 10/21/2018   Procedure: LOOP RECORDER REMOVAL;  Surgeon: Virl Axe  C, MD;  Location: [place] [facility];  Service: Cardiovascular;  Laterality: N/A;   ORIF FEMUR FRACTURE Right 10/22/2018   Procedure: OPEN REDUCTION INTERNAL FIXATION (ORIF) DISTAL FEMUR FRACTURE;  Surgeon: Marchia Bond, MD;  Location: [place];  Service: Orthopedics;  Laterality: Right;   PACEMAKER IMPLANT N/A 10/21/2018   Procedure: PACEMAKER IMPLANT;  Surgeon: Deboraha Sprang, MD;  Location: [place] [facility];  Service: Cardiovascular;  Laterality: N/A;     FAMILY HISTORY   Family History  Problem Relation Age of Onset   Depression Sister    Migraines Sister    Hypertension Mother    Arthritis/Rheumatoid Mother    Heart attack Father    Hypertension Father    CAD Other    Hypertension Other    Diabetes Mellitus II Other    Arthritis Other       SOCIAL HISTORY   Social History   Tobacco Use   Smoking status: Never   Smokeless tobacco: Never  Vaping Use   Vaping Use: Never used  Substance Use Topics   Alcohol use: No    Alcohol/week: 0.0 standard drinks of alcohol   Drug use: No     MEDICATIONS    Home Medication:  Current Outpatient Rx   Order #: [id number] Class: Historical Med   Order #: [id number] Class: Historical Med   Order #: [id number] Class: Historical Med   Order #: [id number] Class: Historical Med   Order #: [id number] Class: Historical Med   Order #: [id number] Class: Historical Med   Order #: [id number] Class: Historical Med   Order #: [id number] Class: Historical Med   Order #: [id number] Class: Historical Med   Order #: [id number] Class: Historical Med   Order #: [id number] Class: Print   Order #: [id number] Class: Historical Med   Order #: [id number] Class: Historical Med   Order #: [id number] Class: Historical Med   Order #: [id number] Class: Historical Med   Order #: [id number] Class: Print   Order #: [id number] Class: Historical Med   Order #: [id number] Class: Historical Med   Order #: [id number] Class: Historical Med   Order #: [id number] Class: Historical Med   Order #: [id number] Class: Historical Med   Order #: [id number] Class: Normal   Order #: [id number] Class: Historical Med   Order #: [id number] Class: Historical Med   Order #: [id number] Class: Print   Order #: [id number] Class: Historical Med   Order #: [id number] Class: Historical Med   Order #: [id number] Class: Historical Med   Order #: [id number] Class: Historical Med   Order #: [id number] Class: Historical Med   Order #: [id number] Class: Historical Med   Order #: [id number] Class: Historical Med   Order #: [id number] Class: Historical Med   Order #: [id number] Class: No Print   Order #: [id number] Class: Historical Med   Order #: [id number] Class: Historical Med   Order #: [id number] Class: Historical Med   Order #: [id number] Class: Historical Med   Order #: [id number] Class:  Historical Med   Order #: [id number] Class: Historical Med   Order #: [id number] Class: Historical Med   Order #: [id number] Class: Historical Med    Current Medication:  Current Facility-Administered Medications:    acetaminophen (TYLENOL) tablet 500 mg, 500 mg, Oral, TID WC PRN, Agbata, Tochukwu, MD   albuterol (PROVENTIL) (2.5 MG/3ML) 0.083% nebulizer solution 3 mL, 3 mL, Inhalation, Q6H PRN, Agbata, Tochukwu, MD   budesonide (PULMICORT) nebulizer solution 0.5 mg, 2 mL, Inhalation, Daily, Agbata, Tochukwu, MD   busPIRone (BUSPAR) tablet 10 mg, 10 mg, Oral, BID, Agbata, Tochukwu, MD   ceFAZolin (ANCEF) IVPB 2g/100 mL premix, 2 g, Intravenous, 30 min Pre-Op, [place], MD   [  START ON 03/12/2022] cholecalciferol (VITAMIN D3) 25 MCG (1000 UNIT) tablet 1,000 Units, 1,000 Units, Oral, Q breakfast, Agbata, Tochukwu, MD   cyanocobalamin (VITAMIN B12) tablet 500 mcg, 500 mcg, Oral, Daily, Agbata, Tochukwu, MD   escitalopram (LEXAPRO) tablet 20 mg, 20 mg, Oral, Daily, Agbata, Tochukwu, MD   famotidine (PEPCID) tablet 20 mg, 20 mg, Oral, Daily, Agbata, Tochukwu, MD   fluticasone (FLONASE) 50 MCG/ACT nasal spray 2 spray, 2 spray, Each Nare, BID PRN, Agbata, Tochukwu, MD   folic acid (FOLVITE) tablet 1 mg, 1 mg, Oral, Daily, Agbata, Tochukwu, MD   furosemide (LASIX) injection 40 mg, 40 mg, Intravenous, BID, Tang, Alanson Puls, PA-C   insulin aspart (novoLOG) injection 0-15 Units, 0-15 Units, Subcutaneous, Q4H, Agbata, Tochukwu, MD   lamoTRIgine (LAMICTAL) tablet 100 mg, 100 mg, Oral, Daily, Agbata, Tochukwu, MD   methocarbamol (ROBAXIN) 500 mg in dextrose 5 % 50 mL IVPB, 500 mg, Intravenous, Q6H PRN, Agbata, Tochukwu, MD   methotrexate (RHEUMATREX) tablet 2.5 mg, 2.5 mg, Oral, Weekly, Agbata, Tochukwu, MD   mirtazapine (REMERON) tablet 15 mg, 15 mg, Oral, QHS, Agbata, Tochukwu, MD   ondansetron (ZOFRAN) injection 4 mg, 4 mg, Intravenous, STAT, Hinda Kehr, MD   ondansetron (ZOFRAN) tablet 4 mg, 4  mg, Oral, Q6H PRN **OR** ondansetron (ZOFRAN) injection 4 mg, 4 mg, Intravenous, Q6H PRN, Agbata, Tochukwu, MD   pramipexole (MIRAPEX) tablet 0.25 mg, 0.25 mg, Oral, Daily, Agbata, Tochukwu, MD   pravastatin (PRAVACHOL) tablet 20 mg, 20 mg, Oral, Daily, Agbata, Tochukwu, MD   pregabalin (LYRICA) capsule 100 mg, 100 mg, Oral, BID, Agbata, Tochukwu, MD   Tofacitinib Citrate TABS 5 mg, 1 tablet, Oral, BID, Agbata, Tochukwu, MD   topiramate (TOPAMAX) tablet 25 mg, 25 mg, Oral, QHS, Agbata, Tochukwu, MD   traZODone (DESYREL) tablet 100 mg, 100 mg, Oral, QHS, Agbata, Tochukwu, MD  Current Outpatient Medications:    carboxymethylcellul-glycerin (REFRESH OPTIVE) 0.5-0.9 % ophthalmic solution, Place 1 drop into both eyes at bedtime., Disp: , Rfl:    ferrous sulfate 325 (65 FE) MG tablet, Take 325 mg by mouth daily with breakfast., Disp: , Rfl:    hydrOXYzine (ATARAX) 25 MG tablet, Take 25 mg by mouth 3 (three) times daily as needed (pruitus)., Disp: , Rfl:    Polyethyl Glycol-Propyl Glycol 0.4-0.3 % SOLN, Place 1 drop into both eyes at bedtime., Disp: , Rfl:    acetaminophen (TYLENOL) 500 MG tablet, Take 1 tablet by mouth 3 (three) times daily with meals as needed., Disp: , Rfl:    albuterol (VENTOLIN HFA) 108 (90 Base) MCG/ACT inhaler, Inhale 1-2 puffs into the lungs every 6 (six) hours as needed for wheezing or shortness of breath., Disp: , Rfl:    budesonide (PULMICORT) 0.5 MG/2ML nebulizer solution, Inhale 2 mLs into the lungs daily., Disp: , Rfl:    busPIRone (BUSPAR) 10 MG tablet, Take 10 mg by mouth 2 (two) times daily., Disp: , Rfl:    Calcium Carb-Cholecalciferol (CALCIUM 500/D) 500-400 MG-UNIT CHEW, Chew by mouth daily., Disp: , Rfl:    calcium carbonate (OS-CAL - DOSED IN MG OF ELEMENTAL CALCIUM) 1250 (500 Ca) MG tablet, Take 2 tablets by mouth every 2 (two) hours as needed., Disp: , Rfl:    Cholecalciferol (VITAMIN D3) 125 MCG (5000 UT) CAPS, Take 1 capsule (5,000 Units total) by mouth daily  with breakfast. Take along with calcium and magnesium., Disp: 30 capsule, Rfl: 2   Cholecalciferol 125 MCG (5000 UT) capsule, Take 5,000 Units by mouth daily., Disp: , Rfl:  ciclopirox (LOPROX) 0.77 % cream, Apply 1 g topically daily., Disp: , Rfl:    cyanocobalamin 500 MCG tablet, Take 500 mcg by mouth daily. (Patient not taking: Reported on 03/11/2022), Disp: , Rfl:    diclofenac sodium (VOLTAREN) 1 % GEL, Apply 2 g topically 3 (three) times daily. Apply to the fingers, Disp: , Rfl:    ergocalciferol (VITAMIN D2) 1.25 MG (50000 UT) capsule, Take 1 capsule (50,000 Units total) by mouth 2 (two) times a week. X 6 weeks., Disp: 12 capsule, Rfl: 0   escitalopram (LEXAPRO) 20 MG tablet, Take 20 mg by mouth daily., Disp: , Rfl:    famotidine (PEPCID) 40 MG tablet, Take 40 mg by mouth daily., Disp: , Rfl:    fluticasone (VERAMYST) 27.5 MCG/SPRAY nasal spray, Place 2 sprays into the nose 2 (two) times daily. (Patient not taking: Reported on 03/11/2022), Disp: , Rfl:    folic acid (FOLVITE) 1 MG tablet, Take 1 mg by mouth daily. (Patient not taking: Reported on 03/11/2022), Disp: , Rfl:    furosemide (LASIX) 40 MG tablet, Take 40 mg by mouth 2 (two) times daily., Disp: , Rfl:    lamoTRIgine (LAMICTAL) 100 MG tablet, Take 1 tablet (100 mg total) by mouth daily., Disp: 90 tablet, Rfl: 3   lisinopril (ZESTRIL) 2.5 MG tablet, Take 1 tablet by mouth daily. (Patient not taking: Reported on 03/11/2022), Disp: , Rfl:    LONHALA MAGNAIR REFILL KIT 25 MCG/ML SOLN, SMARTSIG:1 Vial(s) Via Nebulizer Every 12 Hours PRN (Patient not taking: Reported on 03/11/2022), Disp: , Rfl:    loperamide (IMODIUM A-D) 2 MG tablet, Take 1 tablet (2 mg total) by mouth 4 (four) times daily as needed for diarrhea or loose stools. (Patient taking differently: Take 2 mg by mouth as needed for diarrhea or loose stools.), Disp: 12 tablet, Rfl: 0   loratadine (CLARITIN) 10 MG tablet, Take 10 mg by mouth daily., Disp: , Rfl:    LYRICA 100 MG capsule,  Take 100 mg by mouth 2 (two) times daily., Disp: , Rfl:    methotrexate (RHEUMATREX) 2.5 MG tablet, 1 tablet daily. (Patient not taking: Reported on 03/11/2022), Disp: , Rfl:    mirtazapine (REMERON) 15 MG tablet, Take 15 mg by mouth at bedtime., Disp: , Rfl:    mirtazapine (REMERON) 7.5 MG tablet, Take 1 tablet by mouth at bedtime. (Patient not taking: Reported on 03/11/2022), Disp: , Rfl:    NYAMYC powder, Apply 1 Application topically 2 (two) times daily., Disp: , Rfl:    omeprazole (PRILOSEC) 20 MG capsule, Take 20 mg by mouth daily.  (Patient not taking: Reported on 03/11/2022), Disp: , Rfl:    oxycodone (OXY-IR) 5 MG capsule, Take 5 mg by mouth every 4 (four) hours as needed., Disp: , Rfl:    Potassium Chloride ER 20 MEQ TBCR, Take 20 mEq by mouth 2 (two) times daily., Disp: 30 tablet, Rfl: 0   pramipexole (MIRAPEX) 0.25 MG tablet, Take 0.25 mg by mouth daily. , Disp: , Rfl:    pravastatin (PRAVACHOL) 20 MG tablet, Take 1 tablet by mouth 3 (three) times a week. Monday, Wednesday, and Friday, Disp: , Rfl:    promethazine (PHENERGAN) 25 MG tablet, Take 1 tablet by mouth daily., Disp: , Rfl:    Psyllium (METAMUCIL) WAFR, Take 2 g by mouth daily as needed., Disp: , Rfl:    saxagliptin HCl (ONGLYZA) 5 MG TABS tablet, Take 1 tablet by mouth daily. (Patient not taking: Reported on 03/11/2022), Disp: , Rfl:  Tofacitinib Citrate (XELJANZ) 5 MG TABS, Take 1 tablet by mouth 2 (two) times daily., Disp: , Rfl:    topiramate (TOPAMAX) 25 MG tablet, Take 25 mg by mouth at bedtime. , Disp: , Rfl:    traZODone (DESYREL) 100 MG tablet, Take 1 tablet by mouth daily., Disp: , Rfl:     ALLERGIES   Amoxicillin, Gabapentin, Iodine, Naproxen, Nsaids, and Tolmetin     REVIEW OF SYSTEMS    Review of Systems:  Unable to get info  VS: BP 97/70   Pulse (!) 101   Temp 98.5 F (36.9 C) (Oral)   Resp 20   SpO2 100%      PHYSICAL EXAM    GENERAL:NAD, no fevers, chills, no weakness no fatigue, round face  on bipap.  HEAD: Normocephalic, atraumatic.  EYES: Pupils equal, round, reactive to light. Extraocular muscles intact. No scleral icterus.  MOUTH: Moist mucosal membrane. Dentition intact. No abscess noted.  EAR, NOSE, THROAT: Clear without exudates. No external lesions. Shor, thick neck NECK: Supple. No thyromegaly. No nodules. No JVD.  PULMONARY: distant, no use of accessory muscles, no wheezing CARDIOVASCULAR: S1 and S2. Regular rate and rhythm. No murmurs, rubs, or gallops. No edema. Pedal pulses 2+ bilaterally.  GASTROINTESTINAL: Soft, nontender, nondistended. No masses. Positive bowel sounds. No hepatosplenomegaly.  MUSCULOSKELETAL: No swelling, clubbing, or edema. Range of motion full in all extremities, deformed right ankle.  NEUROLOGIC:/ PSYCHIATRIC: somnolent, moves all extremities equally .  SKIN: No ulceration, lesions, rashes, or cyanosis. Skin warm and dry. Turgor intact.      IMAGING    CT Head Wo Contrast  Result Date: 03/11/2022 CLINICAL DATA:  Head trauma, minor (Age >= 65y); Neck trauma (Age >= 65y) EXAM: CT HEAD WITHOUT CONTRAST CT CERVICAL SPINE WITHOUT CONTRAST TECHNIQUE: Multidetector CT imaging of the head and cervical spine was performed following the standard protocol without intravenous contrast. Multiplanar CT image reconstructions of the cervical spine were also generated. RADIATION DOSE REDUCTION: This exam was performed according to the departmental dose-optimization program which includes automated exposure control, adjustment of the mA and/or kV according to patient size and/or use of iterative reconstruction technique. COMPARISON:  CT head and CT cervical spine October 16, 2020. 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 No evidence of acute large vascular territory infarct, mass lesion, midline shift, or acute hemorrhage. Vascular: No hyperdense vessel identified. Skull: Right posterior vertex scalp  contusion without acute calvarial fracture. Sinuses/Orbits: Mild paranasal sinus mucosal thickening. No acute orbital findings. Other: No mastoid effusions. CT CERVICAL SPINE FINDINGS Alignment: Similar anterolisthesis of C4 on C5, C7 on T1 and T1 on T2. No new sagittal subluxation. Skull base and vertebrae: Vertebral body heights are maintained. No evidence of acute fracture. Soft tissues and spinal canal: No prevertebral fluid or swelling. No visible canal hematoma. Disc levels: Similar multilevel degenerative disc disease and multilevel facet/uncovertebral hypertrophy with varying degrees of neural foraminal stenosis. Upper chest: Visualized lung apices are clear. IMPRESSION: 1. No evidence of acute intracranial abnormality. 2. Right posterior vertex scalp contusion without acute calvarial fracture. 3. No evidence of acute fracture or traumatic malalignment in the cervical spine. Electronically Signed   By: Margaretha Sheffield M.D.   On: 03/11/2022 08:27   CT Cervical Spine Wo Contrast  Result Date: 03/11/2022 CLINICAL DATA:  Head trauma, minor (Age >= 65y); Neck trauma (Age >= 65y) EXAM: CT HEAD WITHOUT CONTRAST CT CERVICAL SPINE WITHOUT CONTRAST TECHNIQUE: Multidetector CT imaging of the  head and cervical spine was performed following the standard protocol without intravenous contrast. Multiplanar CT image reconstructions of the cervical spine were also generated. RADIATION DOSE REDUCTION: This exam was performed according to the departmental dose-optimization program which includes automated exposure control, adjustment of the mA and/or kV according to patient size and/or use of iterative reconstruction technique. COMPARISON:  CT head and CT cervical spine October 16, 2020. 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 No evidence of acute large vascular territory infarct, mass lesion, midline shift, or acute hemorrhage. Vascular: No hyperdense  vessel identified. Skull: Right posterior vertex scalp contusion without acute calvarial fracture. Sinuses/Orbits: Mild paranasal sinus mucosal thickening. No acute orbital findings. Other: No mastoid effusions. CT CERVICAL SPINE FINDINGS Alignment: Similar anterolisthesis of C4 on C5, C7 on T1 and T1 on T2. No new sagittal subluxation. Skull base and vertebrae: Vertebral body heights are maintained. No evidence of acute fracture. Soft tissues and spinal canal: No prevertebral fluid or swelling. No visible canal hematoma. Disc levels: Similar multilevel degenerative disc disease and multilevel facet/uncovertebral hypertrophy with varying degrees of neural foraminal stenosis. Upper chest: Visualized lung apices are clear. IMPRESSION: 1. No evidence of acute intracranial abnormality. 2. Right posterior vertex scalp contusion without acute calvarial fracture. 3. No evidence of acute fracture or traumatic malalignment in the cervical spine. Electronically Signed   By: Margaretha Sheffield M.D.   On: 03/11/2022 08:27   DG Chest Portable 1 View  Result Date: 03/11/2022 CLINICAL DATA:  Provided history: Status post reduction. Shortness of breath. EXAM: PORTABLE CHEST 1 VIEW COMPARISON:  Chest CT 10/02/2020. Prior chest radiographs 10/22/2018 and earlier. FINDINGS: Left chest multi lead implantable cardiac device. Cardiomegaly. Aortic atherosclerosis. Central pulmonary vascular congestion with suspected mild interstitial edema. Chronic elevation of the right hemidiaphragm. Small right pleural effusion. Additional opacity at the right lung base which may reflect atelectasis and/or consolidation. No evidence of pneumothorax. No acute bony abnormality identified. IMPRESSION: Cardiomegaly with central pulmonary vascular congestion and suspected mild interstitial edema. Chronic elevation of the right hemidiaphragm. Small right pleural effusion. Underlying atelectasis and/or consolidation at the right lung base. Aortic  Atherosclerosis (ICD10-I70.0). Electronically Signed   By: Kellie Simmering D.O.   On: 03/11/2022 08:15   DG Ankle 2 Views Right  Result Date: 03/11/2022 CLINICAL DATA:  Status post reduction. EXAM: RIGHT ANKLE - 2 VIEW COMPARISON:  March 11, 2022 FINDINGS: Redemonstrated is comminuted impacted fracture of the distal right tibia. Persistent lateral displacement of the main distal fracture fragment and approximately 1.3 cm impaction. Butterfly fragment is seen posterior to the oblique fracture line. Similar comminuted fracture of the distal right fibula with persistent impaction and angulation. Associated soft tissue swelling. Cast material overlies the soft tissues and obscures details. IMPRESSION: Persistent comminuted impacted fractures of the distal right tibia and fibula. Persistent lateral displacement of the main distal fracture fragment of the tibia and approximately 1.3 cm impaction. Electronically Signed   By: Fidela Salisbury M.D.   On: 03/11/2022 08:15   DG Hip Unilat W or Wo Pelvis 2-3 Views Right  Result Date: 03/11/2022 CLINICAL DATA:  Status post fall. EXAM: DG HIP (WITH OR WITHOUT PELVIS) 2-3V RIGHT COMPARISON:  05/24/21 FINDINGS: The bones appear osteopenic. Degenerative changes noted within the imaged portions of the lumbar spine. Signs of previous IM nail fixation of the right femur. No signs of acute fracture or dislocation. Mild bilateral and symmetric osteoarthritis noted within the hips. IMPRESSION: 1. No acute findings.  2. 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 Electronically Signed   By: Kerby Moors M.D.   On: 03/11/2022 05:42   DG Ankle Complete Right  Result Date: 03/11/2022 CLINICAL DATA:  Status post fall. EXAM: RIGHT ANKLE - COMPLETE 3+ VIEW COMPARISON:  None Available. FINDINGS: The bones appear osteopenic. There is diffuse soft tissue swelling. Vascular calcifications are identified there are acute, comminuted fracture deformities involving the distal  diaphysis of the fibula and tibia. Lateral angulation of the distal fracture fragments identified. Mild lateral and dorsal displacement of the distal fracture fragments also noted. IMPRESSION: 1. Acute, comminuted fracture deformities involve the distal diaphysis of the fibula and tibia. Lateral angulation of the distal fracture fragments. 2. Soft tissue swelling. Electronically Signed   By: Kerby Moors M.D.   On: 03/11/2022 05:41      ASSESSMENT/PLAN   This ia 76 yr old, fell at home, fractured lower extremity, somnolent and hypercapneic, hypoventilation, narcotic induce, no critical head trauma. Base on her habitus, suspect obstructive sleep apnea -agree with bipa -cautious pain control -anti aspiration measures -out patient sleep study if not already done  -phos, tsh, mag  Mild pulm edema -agree with echo -cautious diresis  Small right pleural effusion. ? Cardiac but more concern of tramatic effusion.  -echo -am cxr -if enlarging thoracentesis will be ordered  Right basal consolidation vs atelectasis -am cxr -procalciton level -if elevated start emperic pneumonia coverage  Acute communicated fracture involving the right distal diaphysis of the fibula and tibia -Ortho consult   Thank you for allowing me to participate in the care of this patient.   Patient/Family are satisfied with care plan and all questions have been answered.  This document was prepared using Dragon voice recognition software and may include unintentional dictation errors.     Wallene Huh, M.D.  Division of [place]

## 2022-03-11 NOTE — ED Provider Notes (Signed)
[facility] Provider Note    Event Date/Time   First MD Initiated Contact with Patient 03/11/22 [phone]     (approximate)   History   Fall, Ankle Pain, and Hip Pain   HPI  Meghan [name] is a 76 y.o. female with extensive chronic medical issues (including but not limited to morbid obesity, COPD without supplementary oxygen requirements, hypertension ) including chronic pain in her lower back but no anticoagulation.  She presents by EMS with severe pain in her right ankle as well as some pain in her right hip and a contusion to her head.  She reports that she got up to go to the bathroom and slipped and fell in the restroom.  She struck her head and she has a goose egg but the main issue is her right ankle which is clearly deformed and causes severe pain with any amount of movement.  She is able to wiggle her toes.  She also reports pain in the right hip and has had prior surgery on that hip.  She denies having any chest pain or shortness of breath.  No nausea no vomiting.  No numbness nor tingling.     Physical Exam   Triage Vital Signs: ED Triage Vitals  Enc Vitals Group     BP 03/11/22 0446 100/69     Pulse Rate 03/11/22 0446 96     Resp 03/11/22 0446 16     Temp 03/11/22 0446 98.5 F (36.9 C)     Temp Source 03/11/22 0446 Oral     SpO2 03/11/22 0446 99 %     Weight --      Height --      Head Circumference --      Peak Flow --      Pain Score 03/11/22 0450 10     Pain Loc --      Pain Edu? --      Excl. in [place]? --     Most recent vital signs: Vitals:   03/11/22 0446 03/11/22 0630  BP: 100/69 (!) 89/50  Pulse: 96 100  Resp: 16 12  Temp: 98.5 F (36.9 C)   SpO2: 99% 99%     General: Awake, in substantial pain from her right ankle. CV:  Good peripheral perfusion.  Palpable dorsalis pedis pulse in the affected right foot.  Normal capillary refill.  Normal heart sounds. Resp:  Normal effort.  Lungs are clear to auscultation  bilaterally. Abd:  No distention.  No tenderness to palpation of the abdomen. Other:  Obvious hematoma to the right side of the patient's scalp, no laceration, no bleeding.  No tenderness to palpation of the patient's cervical spine.  Difficult to appreciate pain or tenderness in the patient's right hip given her body habitus as well as the distracting injury of her right ankle.   ED Results / Procedures / Treatments   Labs (all labs ordered are listed, but only abnormal results are displayed) Labs Reviewed  CBC WITH DIFFERENTIAL/PLATELET - Abnormal; Notable for the following components:      Result Value   WBC 3.4 (*)    RBC 3.65 (*)    Hemoglobin 11.6 (*)    HCT 35.0 (*)    Platelets 126 (*)    All other components within normal limits  COMPREHENSIVE METABOLIC PANEL - Abnormal; Notable for the following components:   Potassium 3.0 (*)    Calcium 8.0 (*)    Total Protein 5.7 (*)  Albumin 3.4 (*)    All other components within normal limits  PROTIME-INR  TROPONIN I (HIGH SENSITIVITY)  TROPONIN I (HIGH SENSITIVITY)     EKG  ED ECG REPORT I, Hinda Kehr, the attending physician, personally viewed and interpreted this ECG.  Date: 03/11/2022 EKG Time: 4:52 AM Rate: 94 Rhythm: normal sinus rhythm QRS Axis: Left axis deviation Intervals: Nonspecific intraventricular conduction delay prolonged QTc of 519 ms ST/T Wave abnormalities: Non-specific ST segment / T-wave changes, but no clear evidence of acute ischemia. Narrative Interpretation: no definitive evidence of acute ischemia; does not meet STEMI criteria.  The computer interpreted the EKG as acute MI, but I do not believe it meets criteria for STEMI.    RADIOLOGY I personally viewed and interpreted the patient's right ankle x-rays and right hip/pelvis x-rays.  The patient has displaced and comminuted fractures of distal tibia and fibula with some rotation.  I also personally viewed and interpreted the patient's right  hip and pelvis x-rays.  I cannot appreciate any acute abnormalities.  The patient has pre-existing surgical hardware.   PROCEDURES:  Critical Care performed: No  .Ortho Injury Treatment  Date/Time: 03/11/2022 7:43 AM  Performed by: Hinda Kehr, MD Authorized by: Hinda Kehr, MD   Consent:    Consent obtained:  Emergent situation   Consent given by:  Patient   Risks discussed:  Fracture, irreducible dislocation, nerve damage, restricted joint movement, vascular damage and stiffnessInjury location: ankle Location details: right ankle Injury type: fracture Pre-procedure neurovascular assessment: neurovascularly intact Pre-procedure distal perfusion: normal Pre-procedure neurological function: normal Pre-procedure range of motion: reduced  Anesthesia: Local anesthesia used: no  Patient sedated: NoManipulation performed: yes Skin traction used: no Skeletal traction used: no Reduction successful: yes X-ray confirmed reduction: yes Immobilization: splint Splint type: ankle stirrup and short leg Splint Applied by: ED Provider and ED Tech Supplies used: Ortho-Glass Post-procedure neurovascular assessment: post-procedure neurovascularly intact Post-procedure distal perfusion: normal Post-procedure neurological function: normal Post-procedure range of motion: unchanged      MEDICATIONS ORDERED IN ED: Medications  ondansetron (ZOFRAN) injection 4 mg (4 mg Intravenous Not Given 03/11/22 0601)  morphine (PF) 4 MG/ML injection 4 mg (4 mg Intravenous Given 03/11/22 0502)  ondansetron (ZOFRAN) injection 4 mg (4 mg Intravenous Given 03/11/22 0503)  lactated ringers bolus 1,000 mL (1,000 mLs Intravenous New Bag/Given 03/11/22 0652)     IMPRESSION / MDM / [place] / ED COURSE  I reviewed the triage vital signs and the nursing notes.                              Differential diagnosis includes, but is not limited to, fracture, dislocation, vascular compromise, acute  intracranial bleed, cervical spine injury.  Patient's presentation is most consistent with acute presentation with potential threat to life or bodily function.  The patient will likely need to be scanned (head CT and CT cervical spine), but the primary issue at this point is her ankle given the possibility that she may lose arterial flow.  She currently has a palpable pulse and good venous return with normal capillary refill, but it is so loose and unstable that I am concerned that it could change at any time.  I will obtain x-rays first to try and get an idea of the orientation and the extent of the fractures, but this will almost certainly require surgical intervention after reduction and splinting in the ED.  Also ordered x-rays of  the patient's right hip.  Labs ordered include CMP, CBC with differential, pro time-INR, and high-sensitivity troponin.  I ordered the high-sensitivity troponin because of the somewhat abnormal EKG, but she was not having chest pain before nor is she having chest pain now and I think it is likely her baseline.  The patient is on the cardiac monitor to evaluate for evidence of arrhythmia and/or significant heart rate changes.  The patient received fentanyl in route by EMS, and she is in substantial pain so I ordered another morphine 4 mg IV as well as Zofran 4 mg IV to try to prevent nausea and vomiting.  She will likely need additional medications, certainly prior to reduction of fracture.   Clinical Course as of 03/11/22 0752  Wed Mar 11, 2022  0551 DG Ankle Complete Right [CF]  5053 I was unable to get in touch with Dr. Mack Guise.  I consulted Dr. Roland Rack who came on at 7 AM.  I explained the case and he agreed with the plan for reduction and splinting and recommended consulting the hospitalist for admission for further orthopedic assessment and probable surgery.  Unclear at this point whether Dr. Mack Guise or Dr. Roland Rack will see the patient but they will discuss the  case and one of them will see her. [CF]  [phone] Spoke by phone with Dr. Mack Guise who experienced technical difficulties with his pager overnight.  He will manage the patient.  Will consult the hospitalist team for admission [CF]  0744 Successful reduction to what appears to be near anatomic position from external view.  I suspect that the postreduction films I ordered will not be as favorable given the extent of comminution of both the distal tibia and distal fibula.  Given that the patient also struck her head and given her advanced age, I ordered CT head and CT cervical spine as well as postreduction right ankle x-rays, but I believe that the chance of an intracranial injury or cervical spine injury is very low.  The patient was sleeping and had adequate analgesia to allow me to reduce the ankle and splint her with the assistance of an ED tech without additional medication.  However, she awoke to my voice and saying her name, and she appeared to understand when I told her that we had splinted her ankle and that she would be staying in the hospital for probable surgery later today. [CF]  [phone] Of note, I believe the borderline hypotension is due to her analgesia catching up with her.  Her blood pressure is coming up a bit after her IV fluid bolus. [CF]  [id number] I consulted and discussed the case by phone with Dr. Francine Graven with the hospitalist service.  She will admit the patient. [CF]    Clinical Course User Index [CF] Hinda Kehr, MD     FINAL CLINICAL IMPRESSION(S) / ED DIAGNOSES   Final diagnoses:  Fall, initial encounter  Closed fracture of right ankle, initial encounter  Minor head injury, initial encounter     Rx / DC Orders   ED Discharge Orders     None        Note:  This document was prepared using Dragon voice recognition software and may include unintentional dictation errors.   Hinda Kehr, MD 03/11/22 [phone]

## 2022-03-11 NOTE — ED Triage Notes (Signed)
Pt Got up to use the restroom and fell hitting head. No LOC or blood thinners. Pt has obvious deformity to r ankle and has pain in right hip

## 2022-03-11 NOTE — ED Notes (Signed)
Critical result of pCO2 56mHg communicated via phone to MD Agbata

## 2022-03-11 NOTE — Assessment & Plan Note (Signed)
Noted to have cardiomegaly and evidence of pulmonary vascular congestion on chest x-ray 2D echocardiogram from 05/23 shows an LVEF of about 55% with normal RV systolic function and severe valvular regurgitation. Continue diuretic therapy Hold lisinopril due to relative hypotension We will place patient on IV Lasix

## 2022-03-11 NOTE — ED Notes (Signed)
Critical labs communicated with attending Agbata. PCO2:73

## 2022-03-11 NOTE — Assessment & Plan Note (Signed)
Status post mechanical fall with a right ankle fracture Imaging shows an acute, comminuted fracture deformities involve the distal diaphysis of the fibula and tibia. Lateral angulation of the distal fracture fragments. Soft tissue swelling. Patient is status post reduction in the ER and orthopedic surgery has been consulted for surgical repair Pain control Muscle relaxants Immobilize right lower extremity

## 2022-03-11 NOTE — Assessment & Plan Note (Signed)
Stable Resume buspirone, mirtazapine and trazodone once able

## 2022-03-11 NOTE — Consult Note (Signed)
ORTHOPAEDIC CONSULTATION  REQUESTING PHYSICIAN: Collier Bullock, MD  Chief Complaint: Right tibia and fibula fracture  HPI: Meghan [name] is a 76 y.o. female was admitted overnight after a fall when she got up to use the restroom earlier this morning.  Patient had severe pain and deformity in her right ankle.  Patient was reported to have hit her head as well.  Patient has a history of a previous retrograde nail fixation of her right femur.  She has bilateral knee replacements.  Patient has a pacemaker.  She is not on anticoagulation therapy.  Initially evaluated the patient at 9 AM this morning in the ER.  Patient was lethargic.  An ABG demonstrated hypercapnia.  Patient was started on BiPAP.  Patient was also given Narcan.  Patient this evening is much more alert.  She is currently resting comfortably in her hospital bed on BiPAP.  She denies any significant right leg pain.  Her right lower extremity is splinted and elevated on a pillow.  Patient is admitted to the hospital service with pulmonary and cardiology consultation.  Patient had an echo today showing an EF of 55 to 60%.  There is no intracranial abnormality seen on head CT.  She had a posterior vertex scalp contusion without fracture.  There is no evidence of cervical spine trauma.  Chest x-ray shows:  Cardiomegaly with central pulmonary vascular congestion and suspected mild interstitial edema. Chronic elevation of the right hemidiaphragm. Small right pleural effusion. Underlying atelectasis and/or consolidation at the right lung base.    Past Medical History:  Diagnosis Date   Anginal pain ([place])    Anxiety    Arthritis    RA   Asthma    Brain tumor (benign) (HCC)    CHF (congestive heart failure) (HCC)    Collagen vascular disease (HCC)    Complete heart block (HCC)    COPD (chronic obstructive pulmonary disease) (HCC)    Coronary artery disease    DDD (degenerative disc disease)    Depression    Diabetes mellitus    GERD  (gastroesophageal reflux disease)    Headache    Heart murmur    Hypercholesteremia    Hypertension    Lumbar degenerative disc disease    Migraines    Obesity    Osteopenia    Osteoporosis    Pacemaker- [employer]    Pneumonia    PTSD (post-traumatic stress disorder)    Past Surgical History:  Procedure Laterality Date   ABDOMINAL HYSTERECTOMY     CERVICAL FUSION     CHOLECYSTECTOMY     COLONOSCOPY WITH PROPOFOL N/A 09/20/2015   Procedure: COLONOSCOPY WITH PROPOFOL;  Surgeon: Josefine Class, MD;  Location: [facility] ENDOSCOPY;  Service: Endoscopy;  Laterality: N/A;   COLONOSCOPY WITH PROPOFOL N/A 01/27/2016   Procedure: COLONOSCOPY WITH PROPOFOL;  Surgeon: Manya Silvas, MD;  Location: [facility] ENDOSCOPY;  Service: Endoscopy;  Laterality: N/A;   FINGER ARTHROPLASTY  02/23/2012   Procedure: FINGER ARTHROPLASTY;  Surgeon: Cammie Sickle., MD;  Location: [place];  Service: Orthopedics;  Laterality: Left;  Extensor carpi radialis longus to Extensor carpi ulnaris transfer, left Metaphalangeal reconstructions of index and long fingers,   FOOT ARTHROPLASTY     toes x2 rt foot   HAND RECONSTRUCTION  2011   right-multiple finger joint reconst   JOINT REPLACEMENT     bilat knee replacements   KYPHOPLASTY N/A 06/14/2017   Procedure: KYPHOPLASTY L1;  Surgeon: Hessie Knows, MD;  Location: [facility] ORS;  Service: Orthopedics;  Laterality: N/A;   LOOP RECORDER INSERTION N/A 06/23/2017   Procedure: LOOP RECORDER INSERTION;  Surgeon: Isaias Cowman, MD;  Location: [place] [facility];  Service: Cardiovascular;  Laterality: N/A;   LOOP RECORDER REMOVAL N/A 10/21/2018   Procedure: LOOP RECORDER REMOVAL;  Surgeon: Deboraha Sprang, MD;  Location: [place] [facility];  Service: Cardiovascular;  Laterality: N/A;   ORIF FEMUR FRACTURE Right 10/22/2018   Procedure: OPEN REDUCTION INTERNAL FIXATION (ORIF) DISTAL FEMUR FRACTURE;  Surgeon: Marchia Bond, MD;  Location: [place];  Service:  Orthopedics;  Laterality: Right;   PACEMAKER IMPLANT N/A 10/21/2018   Procedure: PACEMAKER IMPLANT;  Surgeon: Deboraha Sprang, MD;  Location: [place] [facility];  Service: Cardiovascular;  Laterality: N/A;   Social History   Socioeconomic History   Marital status: Divorced    Spouse name: Not on file   Number of children: Not on file   Years of education: Not on file   Highest education level: Not on file  Occupational History   Occupation: retired  Tobacco Use   Smoking status: Never   Smokeless tobacco: Never  Vaping Use   Vaping Use: Never used  Substance and Sexual Activity   Alcohol use: No    Alcohol/week: 0.0 standard drinks of alcohol   Drug use: No   Sexual activity: Never  Other Topics Concern   Not on file  Social History Narrative   Not on file   Social Determinants of Health   Financial Resource Strain: Not on file  Food Insecurity: Not on file  Transportation Needs: Not on file  Physical Activity: Not on file  Stress: Not on file  Social Connections: Not on file   Family History  Problem Relation Age of Onset   Depression Sister    Migraines Sister    Hypertension Mother    Arthritis/Rheumatoid Mother    Heart attack Father    Hypertension Father    CAD Other    Hypertension Other    Diabetes Mellitus II Other    Arthritis Other    Allergies  Allergen Reactions   Amoxicillin Itching and Other (See Comments)    Has patient had a PCN reaction causing immediate rash, facial/tongue/throat swelling, SOB or lightheadedness with hypotension: Unknown Has patient had a PCN reaction causing severe rash involving mucus membranes or skin necrosis: Unknown Has patient had a PCN reaction that required hospitalization: Unknown Has patient had a PCN reaction occurring within the last 10 years: Unknown If all of the above answers are "NO", then may proceed with Cephalosporin use.    Gabapentin Other (See Comments)    Pt states that it causes her BP to drop.     Iodine Other (See Comments)    Reaction:  Syncopy   Naproxen Hives   Nsaids Other (See Comments)    Reaction:  Unknown    Tolmetin Other (See Comments)    Reaction:  Unknown    Prior to Admission medications   Medication Sig Start Date End Date Taking? Authorizing Provider  carboxymethylcellul-glycerin (REFRESH OPTIVE) 0.5-0.9 % ophthalmic solution Place 1 drop into both eyes at bedtime.   Yes [provider]  ferrous sulfate 325 (65 FE) MG tablet Take 325 mg by mouth daily with breakfast.   Yes [provider]  hydrOXYzine (ATARAX) 25 MG tablet Take 25 mg by mouth 3 (three) times daily as needed (pruitus).   Yes [provider]  Polyethyl Glycol-Propyl Glycol 0.4-0.3 %  SOLN Place 1 drop into both eyes at bedtime.   Yes [provider]  acetaminophen (TYLENOL) 500 MG tablet Take 1 tablet by mouth 3 (three) times daily with meals as needed. 08/20/21   [provider]  albuterol (VENTOLIN HFA) 108 (90 Base) MCG/ACT inhaler Inhale 1-2 puffs into the lungs every 6 (six) hours as needed for wheezing or shortness of breath.    [provider]  budesonide (PULMICORT) 0.5 MG/2ML nebulizer solution Inhale 2 mLs into the lungs daily. 11/29/18   [provider]  busPIRone (BUSPAR) 10 MG tablet Take 10 mg by mouth 2 (two) times daily. 12/28/21   [provider]  Calcium Carb-Cholecalciferol (CALCIUM 500/D) 500-400 MG-UNIT CHEW Chew by mouth daily.    [provider]  calcium carbonate (OS-CAL - DOSED IN MG OF ELEMENTAL CALCIUM) 1250 (500 Ca) MG tablet Take 2 tablets by mouth every 2 (two) hours as needed. 01/14/22   [provider]  Cholecalciferol (VITAMIN D3) 125 MCG (5000 UT) CAPS Take 1 capsule (5,000 Units total) by mouth daily with breakfast. Take along with calcium and magnesium. 01/12/22 04/12/22  Milinda Pointer, MD  Cholecalciferol 125 MCG (5000 UT) capsule Take 5,000 Units by mouth daily.    [provider]  ciclopirox (LOPROX) 0.77 % cream Apply 1 g topically daily. 08/15/21   [provider]  cyanocobalamin 500 MCG tablet Take 500 mcg by mouth daily. Patient not taking: Reported on 03/11/2022    [provider]  diclofenac sodium (VOLTAREN) 1 % GEL Apply 2 g topically 3 (three) times daily. Apply to the fingers    [provider]  ergocalciferol (VITAMIN D2) 1.25 MG (50000 UT) capsule Take 1 capsule (50,000 Units total) by mouth 2 (two) times a week. X 6 weeks. 01/12/22 02/23/22  Milinda Pointer, MD  escitalopram (LEXAPRO) 20 MG tablet Take 20 mg by mouth daily. 12/09/21   [provider]  famotidine (PEPCID) 40 MG tablet Take 40 mg by mouth daily. 12/02/21   [provider]  fluticasone (VERAMYST) 27.5 MCG/SPRAY nasal spray Place 2 sprays into the nose 2 (two) times daily. Patient not taking: Reported on 03/11/2022    [provider]  folic acid (FOLVITE) 1 MG tablet Take 1 mg by mouth daily. Patient not taking: Reported on 03/11/2022    [provider]  furosemide (LASIX) 40 MG tablet Take 40 mg by mouth 2 (two) times daily.    [provider]  lamoTRIgine (LAMICTAL) 100 MG tablet Take 1 tablet (100 mg total) by mouth daily. 06/02/16   Rainey Pines, MD  lisinopril (ZESTRIL) 2.5 MG tablet Take 1 tablet by mouth daily. Patient not taking: Reported on 03/11/2022    [provider]  Carma Leaven REFILL KIT 25 MCG/ML SOLN SMARTSIG:1 Vial(s) Via Nebulizer Every 12 Hours PRN Patient not taking: Reported on 03/11/2022 07/16/21   [provider]  loperamide (IMODIUM A-D) 2 MG tablet Take 1 tablet (2 mg total) by mouth 4 (four) times daily as needed for diarrhea or loose stools. Patient taking differently: Take 2 mg by mouth as needed for diarrhea or loose stools. 05/10/17   Eula Listen, MD  loratadine (CLARITIN) 10 MG tablet Take 10 mg by mouth daily.    [provider]  LYRICA 100 MG capsule  Take 100 mg by mouth 2 (two) times daily. 12/17/21   [provider]  methotrexate (RHEUMATREX) 2.5 MG tablet 1 tablet daily. Patient not taking: Reported on 03/11/2022 11/18/18  [provider]  mirtazapine (REMERON) 15 MG tablet Take 15 mg by mouth at bedtime. 02/10/22   [provider]  mirtazapine (REMERON) 7.5 MG tablet Take 1 tablet by mouth at bedtime. Patient not taking: Reported on 03/11/2022    [provider]  [facility] powder Apply 1 Application topically 2 (two) times daily. 02/05/22   [provider]  omeprazole (PRILOSEC) 20 MG capsule Take 20 mg by mouth daily.  Patient not taking: Reported on 03/11/2022    [provider]  oxycodone (OXY-IR) 5 MG capsule Take 5 mg by mouth every 4 (four) hours as needed.    [provider]  Potassium Chloride ER 20 MEQ TBCR Take 20 mEq by mouth 2 (two) times daily. 05/07/17   Vaughan Basta, MD  pramipexole (MIRAPEX) 0.25 MG tablet Take 0.25 mg by mouth daily.     [provider]  pravastatin (PRAVACHOL) 20 MG tablet Take 1 tablet by mouth 3 (three) times a week. Monday, Wednesday, and Friday 12/06/19   [provider]  promethazine (PHENERGAN) 25 MG tablet Take 1 tablet by mouth daily. 12/24/19   [provider]  Psyllium (METAMUCIL) WAFR Take 2 g by mouth daily as needed. 11/11/21   [provider]  saxagliptin HCl (ONGLYZA) 5 MG TABS tablet Take 1 tablet by mouth daily. Patient not taking: Reported on 03/11/2022    [provider]  Tofacitinib Citrate (XELJANZ) 5 MG TABS Take 1 tablet by mouth 2 (two) times daily. 03/03/21   [provider]  topiramate (TOPAMAX) 25 MG tablet Take 25 mg by mouth at bedtime.     [provider]  traZODone (DESYREL) 100 MG tablet Take 1 tablet by mouth daily. 09/21/21   [provider]   ECHOCARDIOGRAM COMPLETE  Result Date: 03/11/2022    ECHOCARDIOGRAM REPORT   Patient Name:   ATHENE [facility]UHM[facility]  Date of Exam: 03/11/2022 Medical Rec #:  [id number]    Height:       55.0 in Accession #:    [id number]   Weight:       144.6 lb Date of Birth:  [birth year]    BSA:          1.527 m Patient Age:    76 years     BP:           97/70 mmHg Patient Gender: F            HR:           104 bpm. Exam Location:  [facility] Procedure: 2D Echo, Color Doppler and Cardiac Doppler Indications:     I50.31 congestive heart failure-Acute Diastolic  History:         Patient has prior history of Echocardiogram examinations, most                  recent 10/21/2018. CHF, Pacemaker, COPD; Risk                  Factors:Hypertension, Diabetes and HCL.  Sonographer:     Charmayne Sheer Referring Phys:  [id number] [place] TANG Diagnosing Phys: Yolonda Kida MD  Sonographer Comments: Suboptimal apical window. Image acquisition challenging due to COPD, Image acquisition challenging due to respiratory motion and Image acquisition challenging due to patient body habitus. IMPRESSIONS  1. Left ventricular ejection fraction, by estimation, is 55 to 60%. The left ventricle has normal function. The left ventricle has no regional wall motion abnormalities. Left ventricular diastolic parameters are consistent  with Grade I diastolic dysfunction (impaired relaxation).  2. Right ventricular systolic function is normal. The right ventricular size is normal.  3. Mitral Vavle annulous thickening/Possible Repair. The mitral valve is degenerative. Mild to moderate mitral valve regurgitation.  4. The aortic valve is normal in structure. Aortic valve regurgitation is not visualized. Aortic valve sclerosis/calcification is present, without any evidence of aortic stenosis. FINDINGS  Left Ventricle: Left ventricular ejection fraction, by estimation, is 55 to 60%. The left ventricle has normal function. The left ventricle has no regional wall motion abnormalities. The left ventricular internal cavity size was normal in size. There is  borderline left ventricular hypertrophy.  Left ventricular diastolic parameters are consistent with Grade I diastolic dysfunction (impaired relaxation). Right Ventricle: The right ventricular size is normal. No increase in right ventricular wall thickness. Right ventricular systolic function is normal. Left Atrium: Left atrial size was normal in size. Right Atrium: Right atrial size was normal in size. Pericardium: There is no evidence of pericardial effusion. Mitral Valve: Mitral Vavle annulous thickening/Possible Repair. The mitral valve is degenerative in appearance. Mild to moderate mitral valve regurgitation. Tricuspid Valve: The tricuspid valve is normal in structure. Tricuspid valve regurgitation is mild. Aortic Valve: The aortic valve is normal in structure. Aortic valve regurgitation is not visualized. Aortic valve sclerosis/calcification is present, without any evidence of aortic stenosis. Aortic valve mean gradient measures 4.0 mmHg. Aortic valve peak  gradient measures 8.8 mmHg. Aortic valve area, by VTI measures 1.33 cm. Pulmonic Valve: The pulmonic valve was grossly normal. Pulmonic valve regurgitation is not visualized. Aorta: The ascending aorta was not well visualized. IAS/Shunts: No atrial level shunt detected by color flow Doppler.  LEFT VENTRICLE PLAX 2D LVIDd:         4.72 cm   Diastology LVIDs:         3.27 cm   LV e' lateral:   5.22 cm/s LV PW:         1.23 cm   LV E/e' lateral: 35.9 LV IVS:        0.92 cm LVOT diam:     1.60 cm LV SV:         29 LV SV Index:   19 LVOT Area:     2.01 cm  RIGHT VENTRICLE RV Basal diam:  2.94 cm LEFT ATRIUM             Index        RIGHT ATRIUM          Index LA diam:        3.80 cm 2.49 cm/m   RA Area:     6.96 cm LA Vol (A2C):   53.8 ml 35.24 ml/m  RA Volume:   10.90 ml 7.14 ml/m LA Vol (A4C):   56.5 ml 37.00 ml/m LA Biplane Vol: 56.2 ml 36.81 ml/m  AORTIC VALVE                    PULMONIC VALVE AV Area (Vmax):    1.31 cm     PV Vmax:       0.99 m/s AV Area (Vmean):   1.22 cm     PV Peak  grad:  3.9 mmHg AV Area (VTI):     1.33 cm AV Vmax:           148.00 cm/s AV Vmean:          95.300 cm/s AV VTI:  0.217 m AV Peak Grad:      8.8 mmHg AV Mean Grad:      4.0 mmHg LVOT Vmax:         96.10 cm/s LVOT Vmean:        57.900 cm/s LVOT VTI:          0.144 m LVOT/AV VTI ratio: 0.66  AORTA Ao Root diam: 3.20 cm MITRAL VALVE                TRICUSPID VALVE MV Area (PHT): 3.25 cm     TR Peak grad:   40.7 mmHg MV Decel Time: 234 msec     TR Vmax:        319.00 cm/s MV E velocity: 187.50 cm/s                             SHUNTS                             Systemic VTI:  0.14 m                             Systemic Diam: 1.60 cm Yolonda Kida MD Electronically signed by Yolonda Kida MD Signature Date/Time: 03/11/2022/4:38:20 PM    Final    CT Head Wo Contrast  Result Date: 03/11/2022 CLINICAL DATA:  Head trauma, minor (Age >= 65y); Neck trauma (Age >= 65y) EXAM: CT HEAD WITHOUT CONTRAST CT CERVICAL SPINE WITHOUT CONTRAST TECHNIQUE: Multidetector CT imaging of the head and cervical spine was performed following the standard protocol without intravenous contrast. Multiplanar CT image reconstructions of the cervical spine were also generated. RADIATION DOSE REDUCTION: This exam was performed according to the departmental dose-optimization program which includes automated exposure control, adjustment of the mA and/or kV according to patient size and/or use of iterative reconstruction technique. COMPARISON:  CT head and CT cervical spine October 16, 2020. 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 No evidence of acute large vascular territory infarct, mass lesion, midline shift, or acute hemorrhage. Vascular: No hyperdense vessel identified. Skull: Right posterior vertex scalp contusion without acute calvarial fracture. Sinuses/Orbits: Mild paranasal sinus mucosal thickening. No acute orbital findings. Other: No mastoid effusions. CT  CERVICAL SPINE FINDINGS Alignment: Similar anterolisthesis of C4 on C5, C7 on T1 and T1 on T2. No new sagittal subluxation. Skull base and vertebrae: Vertebral body heights are maintained. No evidence of acute fracture. Soft tissues and spinal canal: No prevertebral fluid or swelling. No visible canal hematoma. Disc levels: Similar multilevel degenerative disc disease and multilevel facet/uncovertebral hypertrophy with varying degrees of neural foraminal stenosis. Upper chest: Visualized lung apices are clear. IMPRESSION: 1. No evidence of acute intracranial abnormality. 2. Right posterior vertex scalp contusion without acute calvarial fracture. 3. No evidence of acute fracture or traumatic malalignment in the cervical spine. Electronically Signed   By: Margaretha Sheffield M.D.   On: 03/11/2022 08:27   CT Cervical Spine Wo Contrast  Result Date: 03/11/2022 CLINICAL DATA:  Head trauma, minor (Age >= 65y); Neck trauma (Age >= 65y) EXAM: CT HEAD WITHOUT CONTRAST CT CERVICAL SPINE WITHOUT CONTRAST TECHNIQUE: Multidetector CT imaging of the head and cervical spine was performed following the standard protocol without intravenous contrast. Multiplanar CT image reconstructions of the cervical spine were also generated. RADIATION DOSE REDUCTION: This  exam was performed according to the departmental dose-optimization program which includes automated exposure control, adjustment of the mA and/or kV according to patient size and/or use of iterative reconstruction technique. COMPARISON:  CT head and CT cervical spine October 16, 2020. 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 No evidence of acute large vascular territory infarct, mass lesion, midline shift, or acute hemorrhage. Vascular: No hyperdense vessel identified. Skull: Right posterior vertex scalp contusion without acute calvarial fracture. Sinuses/Orbits: Mild paranasal sinus mucosal thickening. No acute  orbital findings. Other: No mastoid effusions. CT CERVICAL SPINE FINDINGS Alignment: Similar anterolisthesis of C4 on C5, C7 on T1 and T1 on T2. No new sagittal subluxation. Skull base and vertebrae: Vertebral body heights are maintained. No evidence of acute fracture. Soft tissues and spinal canal: No prevertebral fluid or swelling. No visible canal hematoma. Disc levels: Similar multilevel degenerative disc disease and multilevel facet/uncovertebral hypertrophy with varying degrees of neural foraminal stenosis. Upper chest: Visualized lung apices are clear. IMPRESSION: 1. No evidence of acute intracranial abnormality. 2. Right posterior vertex scalp contusion without acute calvarial fracture. 3. No evidence of acute fracture or traumatic malalignment in the cervical spine. Electronically Signed   By: Margaretha Sheffield M.D.   On: 03/11/2022 08:27   DG Chest Portable 1 View  Result Date: 03/11/2022 CLINICAL DATA:  Provided history: Status post reduction. Shortness of breath. EXAM: PORTABLE CHEST 1 VIEW COMPARISON:  Chest CT 10/02/2020. Prior chest radiographs 10/22/2018 and earlier. FINDINGS: Left chest multi lead implantable cardiac device. Cardiomegaly. Aortic atherosclerosis. Central pulmonary vascular congestion with suspected mild interstitial edema. Chronic elevation of the right hemidiaphragm. Small right pleural effusion. Additional opacity at the right lung base which may reflect atelectasis and/or consolidation. No evidence of pneumothorax. No acute bony abnormality identified. IMPRESSION: Cardiomegaly with central pulmonary vascular congestion and suspected mild interstitial edema. Chronic elevation of the right hemidiaphragm. Small right pleural effusion. Underlying atelectasis and/or consolidation at the right lung base. Aortic Atherosclerosis (ICD10-I70.0). Electronically Signed   By: Kellie Simmering D.O.   On: 03/11/2022 08:15   DG Ankle 2 Views Right  Result Date: 03/11/2022 CLINICAL DATA:  Status  post reduction. EXAM: RIGHT ANKLE - 2 VIEW COMPARISON:  March 11, 2022 FINDINGS: Redemonstrated is comminuted impacted fracture of the distal right tibia. Persistent lateral displacement of the main distal fracture fragment and approximately 1.3 cm impaction. Butterfly fragment is seen posterior to the oblique fracture line. Similar comminuted fracture of the distal right fibula with persistent impaction and angulation. Associated soft tissue swelling. Cast material overlies the soft tissues and obscures details. IMPRESSION: Persistent comminuted impacted fractures of the distal right tibia and fibula. Persistent lateral displacement of the main distal fracture fragment of the tibia and approximately 1.3 cm impaction. Electronically Signed   By: Fidela Salisbury M.D.   On: 03/11/2022 08:15   DG Hip Unilat W or Wo Pelvis 2-3 Views Right  Result Date: 03/11/2022 CLINICAL DATA:  Status post fall. EXAM: DG HIP (WITH OR WITHOUT PELVIS) 2-3V RIGHT COMPARISON:  05/24/21 FINDINGS: The bones appear osteopenic. Degenerative changes noted within the imaged portions of the lumbar spine. Signs of previous IM nail fixation of the right femur. No signs of acute fracture or dislocation. Mild bilateral and symmetric osteoarthritis noted within the hips. IMPRESSION: 1. No acute findings. 2. 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 Electronically Signed   By: Kerby Moors M.D.   On: 03/11/2022 05:42   DG Ankle Complete  Right  Result Date: 03/11/2022 CLINICAL DATA:  Status post fall. EXAM: RIGHT ANKLE - COMPLETE 3+ VIEW COMPARISON:  None Available. FINDINGS: The bones appear osteopenic. There is diffuse soft tissue swelling. Vascular calcifications are identified there are acute, comminuted fracture deformities involving the distal diaphysis of the fibula and tibia. Lateral angulation of the distal fracture fragments identified. Mild lateral and dorsal displacement of the distal fracture fragments also noted.  IMPRESSION: 1. Acute, comminuted fracture deformities involve the distal diaphysis of the fibula and tibia. Lateral angulation of the distal fracture fragments. 2. Soft tissue swelling. Electronically Signed   By: Kerby Moors M.D.   On: 03/11/2022 05:41    Positive ROS: All other systems have been reviewed and were otherwise negative with the exception of those mentioned in the HPI and as above.  Physical Exam: General: Alert, on BiPAP, in no acute distress  MUSCULOSKELETAL: Right leg: Patient is in an AO splint.  Compartments are soft.  There is no evidence of compartment syndrome.  Distally the patient has intact sensation in all 5 toes.  She can flex and extend her toes and her toes are well-perfused.  Assessment: Right displaced distal tibia and fibula fracture  Plan: I reviewed the patient's x-rays.  Patient has a displaced fracture of the distal tibia and fibula with valgus angulation.  There is no widening the syndesmosis and the mortise is symmetric.  Given the patient's displacement she will require open reduction internal fixation of her fractures.  Patient is tentatively on the OR schedule for 1 PM tomorrow.  Patient will be n.p.o. after midnight.  Patient will require medical clearance tomorrow after diuresis with Lasix overnight.  Patient will continue BiPAP as ordered.  I reviewed the details of the operation as well as the postoperative course with the patient.  At her request I called Tenna Child, a relative and [facility], her daughter this evening.  I reviewed with them the nature of the patient's fracture.  I explained that her fractures will require surgical fixation.  They understand that surgery is tentatively scheduled for tomorrow at 1 PM pending medical clearance.  I answered all their questions.    Thornton Park, MD    03/11/2022 5:49 PM

## 2022-03-11 NOTE — Assessment & Plan Note (Signed)
Stable Continue weekly methotrexate and tofacitinib

## 2022-03-11 NOTE — Progress Notes (Signed)
Patient has complained of severe pain. IV Methocarbamol administered as prescribed

## 2022-03-11 NOTE — ED Notes (Signed)
Patient responding now that narcan has been administered

## 2022-03-11 NOTE — Assessment & Plan Note (Signed)
Most likely secondary to chronic illness Monitor closely during this hospitalization

## 2022-03-12 ENCOUNTER — Encounter
Admission: EM | Disposition: A | Discharge status: Skilled Nursing Facility | Payer: Self-pay | Source: Skilled Nursing Facility | Attending: Internal Medicine

## 2022-03-12 ENCOUNTER — Inpatient Hospital Stay: Payer: Medicare Other

## 2022-03-12 ENCOUNTER — Inpatient Hospital Stay: Payer: Medicare Other | Admitting: Anesthesiology

## 2022-03-12 ENCOUNTER — Other Ambulatory Visit: Payer: Self-pay

## 2022-03-12 ENCOUNTER — Encounter: Payer: Self-pay | Admitting: Internal Medicine

## 2022-03-12 DIAGNOSIS — E119 Type 2 diabetes mellitus without complications: Secondary | ICD-10-CM

## 2022-03-12 DIAGNOSIS — M059 Rheumatoid arthritis with rheumatoid factor, unspecified: Secondary | ICD-10-CM

## 2022-03-12 DIAGNOSIS — E876 Hypokalemia: Secondary | ICD-10-CM

## 2022-03-12 DIAGNOSIS — S82891K Other fracture of right lower leg, subsequent encounter for closed fracture with nonunion: Secondary | ICD-10-CM | POA: Diagnosis not present

## 2022-03-12 DIAGNOSIS — I5033 Acute on chronic diastolic (congestive) heart failure: Secondary | ICD-10-CM | POA: Diagnosis not present

## 2022-03-12 DIAGNOSIS — D61818 Other pancytopenia: Secondary | ICD-10-CM

## 2022-03-12 DIAGNOSIS — R569 Unspecified convulsions: Secondary | ICD-10-CM

## 2022-03-12 DIAGNOSIS — W19XXXD Unspecified fall, subsequent encounter: Secondary | ICD-10-CM | POA: Diagnosis not present

## 2022-03-12 DIAGNOSIS — R400 Somnolence: Secondary | ICD-10-CM | POA: Diagnosis not present

## 2022-03-12 HISTORY — PX: OPEN REDUCTION INTERNAL FIXATION (ORIF) TIBIA/FIBULA FRACTURE: SHX5992

## 2022-03-12 LAB — GLUCOSE, CAPILLARY
Glucose-Capillary: 69 mg/dL — ABNORMAL LOW (ref 70–99)
Glucose-Capillary: 79 mg/dL (ref 70–99)
Glucose-Capillary: 71 mg/dL (ref 70–99)
Glucose-Capillary: 104 mg/dL — ABNORMAL HIGH (ref 70–99)
Glucose-Capillary: 76 mg/dL (ref 70–99)
Glucose-Capillary: 184 mg/dL — ABNORMAL HIGH (ref 70–99)
Glucose-Capillary: 193 mg/dL — ABNORMAL HIGH (ref 70–99)
Glucose-Capillary: 178 mg/dL — ABNORMAL HIGH (ref 70–99)
Glucose-Capillary: 150 mg/dL — ABNORMAL HIGH (ref 70–99)

## 2022-03-12 LAB — CBC
HCT: 33.1 % — ABNORMAL LOW (ref 36.0–46.0)
Hemoglobin: 11.1 g/dL — ABNORMAL LOW (ref 12.0–15.0)
MCH: 32 pg (ref 26.0–34.0)
MCHC: 33.5 g/dL (ref 30.0–36.0)
MCV: 95.4 fL (ref 80.0–100.0)
Platelets: 105 10*3/uL — ABNORMAL LOW (ref 150–400)
RBC: 3.47 MIL/uL — ABNORMAL LOW (ref 3.87–5.11)
RDW: 13.1 % (ref 11.5–15.5)
WBC: 3.2 10*3/uL — ABNORMAL LOW (ref 4.0–10.5)
nRBC: 0 % (ref 0.0–0.2)

## 2022-03-12 LAB — BLOOD GAS, ARTERIAL
Acid-Base Excess: 1.9 mmol/L (ref 0.0–2.0)
Bicarbonate: 28.2 mmol/L — ABNORMAL HIGH (ref 20.0–28.0)
Expiratory PAP: 5 cmH2O
FIO2: 35 %
Inspiratory PAP: 10 cmH2O
O2 Saturation: 100 %
Patient temperature: 37
pCO2 arterial: 50 mmHg — ABNORMAL HIGH (ref 32–48)
pH, Arterial: 7.36 (ref 7.35–7.45)
pO2, Arterial: 130 mmHg — ABNORMAL HIGH (ref 83–108)

## 2022-03-12 LAB — BASIC METABOLIC PANEL
Anion gap: 9 (ref 5–15)
BUN: 13 mg/dL (ref 8–23)
CO2: 28 mmol/L (ref 22–32)
Calcium: 8.3 mg/dL — ABNORMAL LOW (ref 8.9–10.3)
Chloride: 104 mmol/L (ref 98–111)
Creatinine, Ser: 0.82 mg/dL (ref 0.44–1.00)
GFR, Estimated: >60 mL/min (ref >60)
Glucose, Bld: 73 mg/dL (ref 70–99)
Potassium: 3 mmol/L — ABNORMAL LOW (ref 3.5–5.1)
Sodium: 141 mmol/L (ref 135–145)

## 2022-03-12 LAB — MRSA NEXT GEN BY PCR, NASAL: MRSA by PCR Next Gen: DETECTED — AB

## 2022-03-12 SURGERY — OPEN REDUCTION INTERNAL FIXATION (ORIF) TIBIA/FIBULA FRACTURE
Anesthesia: General | Site: Leg Lower | Laterality: Right

## 2022-03-12 MED ORDER — POTASSIUM CHLORIDE 10 MEQ/100ML IV SOLN
10.0000 meq | INTRAVENOUS | Status: AC
  Administered 2022-03-12 (×3): 10 meq via INTRAVENOUS
  Filled 2022-03-12 (×4): qty 100

## 2022-03-12 MED ORDER — DOCUSATE SODIUM 100 MG PO CAPS
100.0000 mg | ORAL_CAPSULE | Freq: Two times a day (BID) | ORAL | Status: DC
  Administered 2022-03-12 – 2022-03-16 (×7): 100 mg via ORAL
  Filled 2022-03-12 (×7): qty 1

## 2022-03-12 MED ORDER — DEXTROSE 50 % IV SOLN
INTRAVENOUS | Status: AC
  Filled 2022-03-12: qty 50

## 2022-03-12 MED ORDER — ALBUMIN HUMAN 25 % IV SOLN
25.0000 g | Freq: Once | INTRAVENOUS | Status: AC
  Administered 2022-03-12: 25 g via INTRAVENOUS
  Filled 2022-03-12: qty 100

## 2022-03-12 MED ORDER — SUCCINYLCHOLINE CHLORIDE 200 MG/10ML IV SOSY
PREFILLED_SYRINGE | INTRAVENOUS | Status: DC | PRN
  Administered 2022-03-12: 80 mg via INTRAVENOUS

## 2022-03-12 MED ORDER — PROPOFOL 10 MG/ML IV BOLUS
INTRAVENOUS | Status: DC | PRN
  Administered 2022-03-12: 60 mg via INTRAVENOUS

## 2022-03-12 MED ORDER — PROPOFOL 1000 MG/100ML IV EMUL
INTRAVENOUS | Status: AC
  Filled 2022-03-12: qty 100

## 2022-03-12 MED ORDER — PHENYLEPHRINE HCL-NACL 20-0.9 MG/250ML-% IV SOLN
INTRAVENOUS | Status: AC
  Filled 2022-03-12: qty 250

## 2022-03-12 MED ORDER — ONDANSETRON HCL 4 MG/2ML IJ SOLN: 4.0000 mg | Freq: Four times a day (QID) | INTRAMUSCULAR | Status: DC | PRN

## 2022-03-12 MED ORDER — SUCCINYLCHOLINE CHLORIDE 200 MG/10ML IV SOSY
PREFILLED_SYRINGE | INTRAVENOUS | Status: AC
  Filled 2022-03-12: qty 10

## 2022-03-12 MED ORDER — FLUMAZENIL 0.5 MG/5ML IV SOLN
INTRAVENOUS | Status: DC | PRN
  Administered 2022-03-12: .3 mg via INTRAVENOUS

## 2022-03-12 MED ORDER — SENNA 8.6 MG PO TABS
1.0000 | ORAL_TABLET | Freq: Two times a day (BID) | ORAL | Status: DC
  Administered 2022-03-12 – 2022-03-16 (×7): 8.6 mg via ORAL
  Filled 2022-03-12 (×7): qty 1

## 2022-03-12 MED ORDER — DEXTROSE 50 % IV SOLN
1.0000 | Freq: Once | INTRAVENOUS | Status: AC
  Administered 2022-03-12: 50 mL via INTRAVENOUS

## 2022-03-12 MED ORDER — MIDAZOLAM HCL 2 MG/2ML IJ SOLN
INTRAMUSCULAR | Status: AC
  Filled 2022-03-12: qty 2

## 2022-03-12 MED ORDER — CEFAZOLIN SODIUM-DEXTROSE 2-4 GM/100ML-% IV SOLN
2.0000 g | INTRAVENOUS | Status: AC
  Administered 2022-03-12: 2 g via INTRAVENOUS

## 2022-03-12 MED ORDER — KETAMINE HCL 50 MG/5ML IJ SOSY
PREFILLED_SYRINGE | INTRAMUSCULAR | Status: AC
  Filled 2022-03-12: qty 5

## 2022-03-12 MED ORDER — ALUM & MAG HYDROXIDE-SIMETH 200-200-20 MG/5ML PO SUSP: 30.0000 mL | ORAL | Status: DC | PRN

## 2022-03-12 MED ORDER — CEFAZOLIN SODIUM-DEXTROSE 2-4 GM/100ML-% IV SOLN
2.0000 g | Freq: Four times a day (QID) | INTRAVENOUS | Status: AC
  Administered 2022-03-12 – 2022-03-13 (×2): 2 g via INTRAVENOUS
  Filled 2022-03-12 (×3): qty 100

## 2022-03-12 MED ORDER — CHLORHEXIDINE GLUCONATE CLOTH 2 % EX PADS
6.0000 | MEDICATED_PAD | Freq: Every day | CUTANEOUS | Status: DC
  Administered 2022-03-12 – 2022-03-16 (×4): 6 via TOPICAL

## 2022-03-12 MED ORDER — MIDAZOLAM HCL 5 MG/5ML IJ SOLN
INTRAMUSCULAR | Status: DC | PRN
  Administered 2022-03-12: 2 mg via INTRAVENOUS

## 2022-03-12 MED ORDER — FUROSEMIDE 10 MG/ML IJ SOLN
20.0000 mg | Freq: Two times a day (BID) | INTRAMUSCULAR | Status: DC
  Administered 2022-03-12 – 2022-03-16 (×8): 20 mg via INTRAVENOUS
  Filled 2022-03-12 (×3): qty 2
  Filled 2022-03-12: qty 4
  Filled 2022-03-12 (×2): qty 2
  Filled 2022-03-12 (×2): qty 4

## 2022-03-12 MED ORDER — PHENYLEPHRINE 80 MCG/ML (10ML) SYRINGE FOR IV PUSH (FOR BLOOD PRESSURE SUPPORT)
PREFILLED_SYRINGE | INTRAVENOUS | Status: DC | PRN
  Administered 2022-03-12: 80 ug via INTRAVENOUS

## 2022-03-12 MED ORDER — FENTANYL CITRATE (PF) 100 MCG/2ML IJ SOLN
INTRAMUSCULAR | Status: AC
  Filled 2022-03-12: qty 2

## 2022-03-12 MED ORDER — FUROSEMIDE 10 MG/ML IJ SOLN
40.0000 mg | Freq: Once | INTRAMUSCULAR | Status: DC
Start: 2022-03-12 — End: 2022-03-12

## 2022-03-12 MED ORDER — DEXAMETHASONE SODIUM PHOSPHATE 10 MG/ML IJ SOLN
INTRAMUSCULAR | Status: DC | PRN
  Administered 2022-03-12: 5 mg via INTRAVENOUS

## 2022-03-12 MED ORDER — ONDANSETRON HCL 4 MG PO TABS: 4.0000 mg | ORAL_TABLET | Freq: Four times a day (QID) | ORAL | Status: DC | PRN

## 2022-03-12 MED ORDER — ONDANSETRON HCL 4 MG/2ML IJ SOLN
INTRAMUSCULAR | Status: DC | PRN
  Administered 2022-03-12: 4 mg via INTRAVENOUS

## 2022-03-12 MED ORDER — CEFAZOLIN SODIUM-DEXTROSE 2-4 GM/100ML-% IV SOLN
INTRAVENOUS | Status: AC
  Filled 2022-03-12: qty 100

## 2022-03-12 MED ORDER — TRAMADOL HCL 50 MG PO TABS
50.0000 mg | ORAL_TABLET | Freq: Four times a day (QID) | ORAL | Status: DC
  Filled 2022-03-12: qty 1

## 2022-03-12 MED ORDER — MIDODRINE HCL 5 MG PO TABS
5.0000 mg | ORAL_TABLET | Freq: Once | ORAL | Status: DC
  Administered 2022-03-12: 5 mg via ORAL
  Filled 2022-03-12 (×3): qty 1

## 2022-03-12 MED ORDER — HYDROMORPHONE HCL 1 MG/ML IJ SOLN: 0.5000 mg | INTRAMUSCULAR | Status: DC | PRN

## 2022-03-12 MED ORDER — OXYCODONE HCL 5 MG PO TABS: 5.0000 mg | ORAL_TABLET | Freq: Once | ORAL | Status: DC | PRN

## 2022-03-12 MED ORDER — DEXAMETHASONE SODIUM PHOSPHATE 10 MG/ML IJ SOLN
INTRAMUSCULAR | Status: AC
  Filled 2022-03-12: qty 1

## 2022-03-12 MED ORDER — POLYETHYLENE GLYCOL 3350 17 G PO PACK: 17.0000 g | PACK | Freq: Every day | ORAL | Status: DC | PRN

## 2022-03-12 MED ORDER — ENOXAPARIN SODIUM 40 MG/0.4ML IJ SOSY
40.0000 mg | PREFILLED_SYRINGE | INTRAMUSCULAR | Status: DC
  Administered 2022-03-13 – 2022-03-16 (×4): 40 mg via SUBCUTANEOUS
  Filled 2022-03-12 (×4): qty 0.4

## 2022-03-12 MED ORDER — MENTHOL 3 MG MT LOZG: 1.0000 | LOZENGE | OROMUCOSAL | Status: DC | PRN

## 2022-03-12 MED ORDER — ROCURONIUM BROMIDE 100 MG/10ML IV SOLN
INTRAVENOUS | Status: DC | PRN
  Administered 2022-03-12: 20 mg via INTRAVENOUS
  Administered 2022-03-12: 10 mg via INTRAVENOUS
  Administered 2022-03-12: 30 mg via INTRAVENOUS

## 2022-03-12 MED ORDER — SEVOFLURANE IN SOLN
RESPIRATORY_TRACT | Status: AC
  Filled 2022-03-12: qty 250

## 2022-03-12 MED ORDER — SODIUM CHLORIDE 0.9 % IV SOLN
INTRAVENOUS | Status: DC
Start: 2022-03-12 — End: 2022-03-13

## 2022-03-12 MED ORDER — ACETAMINOPHEN 10 MG/ML IV SOLN
INTRAVENOUS | Status: DC | PRN
  Administered 2022-03-12: 1000 mg via INTRAVENOUS

## 2022-03-12 MED ORDER — FENTANYL CITRATE (PF) 100 MCG/2ML IJ SOLN: 25.0000 ug | INTRAMUSCULAR | Status: DC | PRN

## 2022-03-12 MED ORDER — SODIUM CHLORIDE 0.9 % IR SOLN
Status: DC | PRN
  Administered 2022-03-12: 1004 mL

## 2022-03-12 MED ORDER — PHENOL 1.4 % MT LIQD: 1.0000 | OROMUCOSAL | Status: DC | PRN

## 2022-03-12 MED ORDER — ACETAMINOPHEN 500 MG PO TABS
1000.0000 mg | ORAL_TABLET | Freq: Four times a day (QID) | ORAL | Status: AC
  Administered 2022-03-13 (×2): 1000 mg via ORAL
  Filled 2022-03-12 (×3): qty 2

## 2022-03-12 MED ORDER — ROCURONIUM BROMIDE 10 MG/ML (PF) SYRINGE
PREFILLED_SYRINGE | INTRAVENOUS | Status: AC
  Filled 2022-03-12: qty 10

## 2022-03-12 MED ORDER — ACETAMINOPHEN 10 MG/ML IV SOLN
INTRAVENOUS | Status: AC
  Filled 2022-03-12: qty 100

## 2022-03-12 MED ORDER — FENTANYL CITRATE (PF) 100 MCG/2ML IJ SOLN
INTRAMUSCULAR | Status: AC
  Administered 2022-03-12: 50 ug via INTRAVENOUS
  Filled 2022-03-12: qty 2

## 2022-03-12 MED ORDER — FENTANYL CITRATE (PF) 100 MCG/2ML IJ SOLN
INTRAMUSCULAR | Status: DC | PRN
Start: 2022-03-12 — End: 2022-03-12
  Administered 2022-03-12 (×2): 25 ug via INTRAVENOUS

## 2022-03-12 MED ORDER — BISACODYL 10 MG RE SUPP: 10.0000 mg | Freq: Every day | RECTAL | Status: DC | PRN

## 2022-03-12 MED ORDER — SUGAMMADEX SODIUM 200 MG/2ML IV SOLN
INTRAVENOUS | Status: DC | PRN
  Administered 2022-03-12: 200 mg via INTRAVENOUS

## 2022-03-12 MED ORDER — KETAMINE HCL 10 MG/ML IJ SOLN
INTRAMUSCULAR | Status: DC | PRN
  Administered 2022-03-12 (×2): 20 mg via INTRAVENOUS

## 2022-03-12 MED ORDER — OXYCODONE HCL 5 MG/5ML PO SOLN: 5.0000 mg | Freq: Once | ORAL | Status: DC | PRN

## 2022-03-12 MED ORDER — BUPIVACAINE HCL 0.5 % IJ SOLN: INTRAMUSCULAR | Status: DC | PRN

## 2022-03-12 MED ORDER — BUPIVACAINE HCL (PF) 0.5 % IJ SOLN
INTRAMUSCULAR | Status: AC
  Filled 2022-03-12: qty 30

## 2022-03-12 MED ORDER — PROPOFOL 10 MG/ML IV BOLUS
INTRAVENOUS | Status: AC
  Filled 2022-03-12: qty 20

## 2022-03-12 MED ORDER — POTASSIUM CHLORIDE CRYS ER 20 MEQ PO TBCR
40.0000 meq | EXTENDED_RELEASE_TABLET | Freq: Once | ORAL | Status: AC
Start: 2022-03-12 — End: 2022-03-12
  Administered 2022-03-12: 40 meq via ORAL
  Filled 2022-03-12: qty 2

## 2022-03-12 MED ORDER — PHENYLEPHRINE HCL-NACL 20-0.9 MG/250ML-% IV SOLN
INTRAVENOUS | Status: DC | PRN
  Administered 2022-03-12: 30 ug/min via INTRAVENOUS

## 2022-03-12 MED ORDER — OXYCODONE HCL 5 MG PO TABS: 5.0000 mg | ORAL_TABLET | ORAL | Status: DC | PRN

## 2022-03-12 MED ORDER — SODIUM CHLORIDE 0.9 % IV BOLUS: 250.0000 mL | Freq: Once | INTRAVENOUS | Status: DC

## 2022-03-12 MED ORDER — PHENYLEPHRINE HCL (PRESSORS) 10 MG/ML IV SOLN
INTRAVENOUS | Status: DC | PRN
  Administered 2022-03-12: 160 ug via INTRAVENOUS

## 2022-03-12 MED ORDER — DEXTROSE-NACL 5-0.9 % IV SOLN: Freq: Once | INTRAVENOUS | Status: AC

## 2022-03-12 MED ORDER — ONDANSETRON HCL 4 MG/2ML IJ SOLN
INTRAMUSCULAR | Status: AC
  Filled 2022-03-12: qty 2

## 2022-03-12 SURGICAL SUPPLY — 70 items
APL PRP STRL LF DISP 70% ISPRP (MISCELLANEOUS) ×3
BIT DRILL 2.5X2.75 QC CALB (BIT) ×1 IMPLANT
BIT DRILL 2.9X70 QC CALB (BIT) ×1 IMPLANT
BNDG ESMARK 6X12 TAN STRL LF (GAUZE/BANDAGES/DRESSINGS) ×2 IMPLANT
CHLORAPREP W/TINT 26 (MISCELLANEOUS) ×3 IMPLANT
CUFF TOURN SGL QUICK 24 (TOURNIQUET CUFF)
CUFF TOURN SGL QUICK 34 (TOURNIQUET CUFF)
CUFF TRNQT CYL 24X4X16.5-23 (TOURNIQUET CUFF) IMPLANT
CUFF TRNQT CYL 34X4.125X (TOURNIQUET CUFF) IMPLANT
DRAPE 3/4 80X56 (DRAPES) ×2 IMPLANT
DRAPE C-ARM XRAY 36X54 (DRAPES) ×2 IMPLANT
DRAPE C-ARMOR (DRAPES) ×2 IMPLANT
DRAPE INCISE IOBAN 66X45 STRL (DRAPES) ×2 IMPLANT
DRAPE INCISE IOBAN 66X60 STRL (DRAPES) ×2 IMPLANT
DRSG EMULSION OIL 3X8 NADH (GAUZE/BANDAGES/DRESSINGS) ×1 IMPLANT
DURAPREP 26ML APPLICATOR (WOUND CARE) ×3 IMPLANT
ELECT CAUTERY BLADE 6.4 (BLADE) ×2 IMPLANT
ELECT REM PT RETURN 9FT ADLT (ELECTROSURGICAL) ×2
ELECTRODE REM PT RTRN 9FT ADLT (ELECTROSURGICAL) ×1 IMPLANT
GAUZE SPONGE 4X4 12PLY STRL (GAUZE/BANDAGES/DRESSINGS) ×2 IMPLANT
GLOVE BIOGEL PI IND STRL 9 (GLOVE) ×1 IMPLANT
GLOVE BIOGEL PI INDICATOR 9 (GLOVE) ×1
GLOVE BIOGEL PI ORTHO SZ9 (GLOVE) ×8 IMPLANT
GOWN STRL REUS W/ TWL LRG LVL3 (GOWN DISPOSABLE) ×1 IMPLANT
GOWN STRL REUS W/ TWL XL LVL3 (GOWN DISPOSABLE) ×1 IMPLANT
GOWN STRL REUS W/TWL 2XL LVL3 (GOWN DISPOSABLE) ×2 IMPLANT
GOWN STRL REUS W/TWL LRG LVL3 (GOWN DISPOSABLE) ×2
GOWN STRL REUS W/TWL XL LVL3 (GOWN DISPOSABLE) ×2
GOWN STRL REUS W/TWL XL LVL4 (GOWN DISPOSABLE) ×2 IMPLANT
HANDLE YANKAUER SUCT BULB TIP (MISCELLANEOUS) ×2 IMPLANT
HEMOVAC 400CC 10FR (MISCELLANEOUS) ×2 IMPLANT
K-WIRE ACE 1.6X6 (WIRE) ×6
KIT TURNOVER KIT A (KITS) ×2 IMPLANT
KWIRE ACE 1.6X6 (WIRE) IMPLANT
MANIFOLD NEPTUNE II (INSTRUMENTS) ×2 IMPLANT
NDL FILTER BLUNT 18X1 1/2 (NEEDLE) ×1 IMPLANT
NEEDLE FILTER BLUNT 18X 1/2SAF (NEEDLE) ×1
NEEDLE FILTER BLUNT 18X1 1/2 (NEEDLE) ×1 IMPLANT
NS IRRIG 1000ML POUR BTL (IV SOLUTION) ×2 IMPLANT
PACK TOTAL KNEE (MISCELLANEOUS) ×2 IMPLANT
PAD PREP 24X41 OB/GYN DISP (PERSONAL CARE ITEMS) ×2 IMPLANT
PLATE ACE 100DEG 8HOLE (Plate) ×1 IMPLANT
PLATE LOCK 6H RT MED DIST TIB (Plate) ×1 IMPLANT
SCREW 3.5MM CORT LP 34MM (Screw) ×1 IMPLANT
SCREW CORT 3.5X40 (Screw) ×1 IMPLANT
SCREW CORT T15 24X3.5XST LCK (Screw) IMPLANT
SCREW CORTICAL 3.5MM  12MM (Screw) ×4 IMPLANT
SCREW CORTICAL 3.5MM 12MM (Screw) IMPLANT
SCREW CORTICAL 3.5MM 14MM (Screw) ×3 IMPLANT
SCREW CORTICAL 3.5MM 22MM (Screw) ×1 IMPLANT
SCREW CORTICAL 3.5MM 24MM (Screw) ×2 IMPLANT
SCREW CORTICAL 3.5X24MM (Screw) ×4 IMPLANT
SCREW LOCK 3.5X34 DIST TIB (Screw) ×1 IMPLANT
SCREW LOCK 3.5X38 DIST TIB (Screw) ×1 IMPLANT
SCREW LOCK CORT STAR 3.5X28 (Screw) ×1 IMPLANT
SCREW LOCK CORT STAR 3.5X30 (Screw) ×1 IMPLANT
SCREW LOW PROFILE 3.5MMX42 (Screw) ×1 IMPLANT
SCREW LP 3.5X38MM (Screw) ×1 IMPLANT
SCREW NL LP 36X3.5 (Screw) ×2 IMPLANT
SCREW NLOCK CANC HEX 4X16 (Screw) ×2 IMPLANT
SPLINT CAST 1 STEP 4X30 (MISCELLANEOUS) ×4 IMPLANT
STAPLER SKIN PROX 35W (STAPLE) ×2 IMPLANT
SUT ETHILON 3-0 (SUTURE) ×2 IMPLANT
SUT VIC AB 2-0 SH 27 (SUTURE) ×4
SUT VIC AB 2-0 SH 27XBRD (SUTURE) ×1 IMPLANT
SUT VICRYL 1-0 27IN ABS (SUTURE) ×2
SUTURE VICRYL 1-0 27IN ABS (SUTURE) ×1 IMPLANT
SYR 5ML LL (SYRINGE) ×1 IMPLANT
TRAY FOLEY MTR SLVR 16FR STAT (SET/KITS/TRAYS/PACK) ×2 IMPLANT
WATER STERILE IRR 500ML POUR (IV SOLUTION) ×2 IMPLANT

## 2022-03-12 NOTE — Progress Notes (Signed)
Pulmonary Medicine          Date: 03/12/2022,   MRN# [id number] Meghan [name] 1945/10/31      HISTORY OF PRESENT ILLNESS   Alert off bipap. Mentating and answering questions appropriately. No cardiac sxs, no wheezing or chest tightness. Mention she has copd. ? Degree of severity. For ortho surgery to fix the right lower leg fracture due to a fall.   Pulmonary wise ok for the procedure, recommending  via spinal block. ( Copd, on oxygen, hypercapnia, osa etc)   PAST MEDICAL HISTORY   Past Medical History:  Diagnosis Date   Anginal pain ([place])    Anxiety    Arthritis    RA   Asthma    Brain tumor (benign) (HCC)    CHF (congestive heart failure) (HCC)    Collagen vascular disease (HCC)    Complete heart block (HCC)    COPD (chronic obstructive pulmonary disease) (HCC)    Coronary artery disease    DDD (degenerative disc disease)    Depression    Diabetes mellitus    GERD (gastroesophageal reflux disease)    Headache    Heart murmur    Hypercholesteremia    Hypertension    Lumbar degenerative disc disease    Migraines    Obesity    Osteopenia    Osteoporosis    Pacemaker- [employer]    Pneumonia    PTSD (post-traumatic stress disorder)      SURGICAL HISTORY   Past Surgical History:  Procedure Laterality Date   ABDOMINAL HYSTERECTOMY     CERVICAL FUSION     CHOLECYSTECTOMY     COLONOSCOPY WITH PROPOFOL N/A 09/20/2015   Procedure: COLONOSCOPY WITH PROPOFOL;  Surgeon: Josefine Class, MD;  Location: [facility] ENDOSCOPY;  Service: Endoscopy;  Laterality: N/A;   COLONOSCOPY WITH PROPOFOL N/A 01/27/2016   Procedure: COLONOSCOPY WITH PROPOFOL;  Surgeon: Manya Silvas, MD;  Location: [facility] ENDOSCOPY;  Service: Endoscopy;  Laterality: N/A;   FINGER ARTHROPLASTY  02/23/2012   Procedure: FINGER ARTHROPLASTY;  Surgeon: Cammie Sickle., MD;  Location: [place];  Service: Orthopedics;  Laterality: Left;  Extensor carpi radialis longus to Extensor  carpi ulnaris transfer, left Metaphalangeal reconstructions of index and long fingers,   FOOT ARTHROPLASTY     toes x2 rt foot   HAND RECONSTRUCTION  2011   right-multiple finger joint reconst   JOINT REPLACEMENT     bilat knee replacements   KYPHOPLASTY N/A 06/14/2017   Procedure: KYPHOPLASTY L1;  Surgeon: Hessie Knows, MD;  Location: [facility] ORS;  Service: Orthopedics;  Laterality: N/A;   LOOP RECORDER INSERTION N/A 06/23/2017   Procedure: LOOP RECORDER INSERTION;  Surgeon: Isaias Cowman, MD;  Location: [place] [facility];  Service: Cardiovascular;  Laterality: N/A;   LOOP RECORDER REMOVAL N/A 10/21/2018   Procedure: LOOP RECORDER REMOVAL;  Surgeon: Deboraha Sprang, MD;  Location: [place] [facility];  Service: Cardiovascular;  Laterality: N/A;   ORIF FEMUR FRACTURE Right 10/22/2018   Procedure: OPEN REDUCTION INTERNAL FIXATION (ORIF) DISTAL FEMUR FRACTURE;  Surgeon: Marchia Bond, MD;  Location: [place];  Service: Orthopedics;  Laterality: Right;   PACEMAKER IMPLANT N/A 10/21/2018   Procedure: PACEMAKER IMPLANT;  Surgeon: Deboraha Sprang, MD;  Location: [place] [facility];  Service: Cardiovascular;  Laterality: N/A;     FAMILY HISTORY   Family History  Problem Relation Age of Onset   Depression Sister    Migraines Sister  Hypertension Mother    Arthritis/Rheumatoid Mother    Heart attack Father    Hypertension Father    CAD Other    Hypertension Other    Diabetes Mellitus II Other    Arthritis Other      SOCIAL HISTORY   Social History   Tobacco Use   Smoking status: Never   Smokeless tobacco: Never  Vaping Use   Vaping Use: Never used  Substance Use Topics   Alcohol use: No    Alcohol/week: 0.0 standard drinks of alcohol   Drug use: No     MEDICATIONS    Home Medication:    Current Medication:  Current Facility-Administered Medications:    acetaminophen (TYLENOL) tablet 1,000 mg, 1,000 mg, Oral, Q6H PRN, Sharion Settler, NP, 1,000 mg at 03/12/22  0905   albuterol (PROVENTIL) (2.5 MG/3ML) 0.083% nebulizer solution 3 mL, 3 mL, Inhalation, Q6H PRN, Agbata, Tochukwu, MD   budesonide (PULMICORT) nebulizer solution 0.5 mg, 2 mL, Inhalation, Daily, Agbata, Tochukwu, MD, 0.5 mg at 03/12/22 0810   busPIRone (BUSPAR) tablet 10 mg, 10 mg, Oral, BID, Agbata, Tochukwu, MD, 10 mg at 03/12/22 0900   ceFAZolin (ANCEF) IVPB 2g/100 mL premix, 2 g, Intravenous, 30 min Pre-Op, Kurtis Bushman, Sahar, MD   cholecalciferol (VITAMIN D3) 25 MCG (1000 UNIT) tablet 1,000 Units, 1,000 Units, Oral, Q breakfast, Agbata, Tochukwu, MD   cyanocobalamin (VITAMIN B12) tablet 500 mcg, 500 mcg, Oral, Daily, Agbata, Tochukwu, MD   escitalopram (LEXAPRO) tablet 20 mg, 20 mg, Oral, Daily, Agbata, Tochukwu, MD, 20 mg at 03/12/22 0900   famotidine (PEPCID) tablet 20 mg, 20 mg, Oral, Daily, Agbata, Tochukwu, MD, 20 mg at 03/12/22 0900   fluticasone (FLONASE) 50 MCG/ACT nasal spray 2 spray, 2 spray, Each Nare, BID PRN, Agbata, Tochukwu, MD   folic acid (FOLVITE) tablet 1 mg, 1 mg, Oral, Daily, Agbata, Tochukwu, MD   furosemide (LASIX) injection 20 mg, 20 mg, Intravenous, Q12H, Amery, Sahar, MD, 20 mg at 03/12/22 0900   insulin aspart (novoLOG) injection 0-15 Units, 0-15 Units, Subcutaneous, Q4H, Agbata, Tochukwu, MD   lamoTRIgine (LAMICTAL) tablet 100 mg, 100 mg, Oral, Daily, Agbata, Tochukwu, MD, 100 mg at 03/12/22 0900   methocarbamol (ROBAXIN) 500 mg in dextrose 5 % 50 mL IVPB, 500 mg, Intravenous, Q6H PRN, Agbata, Tochukwu, MD, Last Rate: 110 mL/hr at 03/11/22 2257, 500 mg at 03/11/22 2257   mirtazapine (REMERON) tablet 15 mg, 15 mg, Oral, QHS, Agbata, Tochukwu, MD, 15 mg at 03/11/22 2209   ondansetron (ZOFRAN) tablet 4 mg, 4 mg, Oral, Q6H PRN **OR** ondansetron (ZOFRAN) injection 4 mg, 4 mg, Intravenous, Q6H PRN, Agbata, Tochukwu, MD   potassium chloride 10 mEq in 100 mL IVPB, 10 mEq, Intravenous, Q1 Hr x 4, Tang, Lily Michelle, PA-C, Last Rate: 100 mL/hr at 03/12/22 1027, 10 mEq at  03/12/22 1027   pramipexole (MIRAPEX) tablet 0.25 mg, 0.25 mg, Oral, Daily, Agbata, Tochukwu, MD, 0.25 mg at 03/12/22 0903   pravastatin (PRAVACHOL) tablet 20 mg, 20 mg, Oral, Daily, Agbata, Tochukwu, MD, 20 mg at 03/12/22 0900   pregabalin (LYRICA) capsule 100 mg, 100 mg, Oral, BID, Agbata, Tochukwu, MD, 100 mg at 03/12/22 0859   Tofacitinib Citrate TABS 5 mg, 1 tablet, Oral, BID, Agbata, Tochukwu, MD   topiramate (TOPAMAX) tablet 25 mg, 25 mg, Oral, QHS, Agbata, Tochukwu, MD, 25 mg at 03/11/22 2216   traZODone (DESYREL) tablet 100 mg, 100 mg, Oral, QHS, Agbata, Tochukwu, MD, 100 mg at 03/11/22 2210    ALLERGIES  Amoxicillin, Gabapentin, Iodine, Naproxen, Nsaids, and Tolmetin     REVIEW OF SYSTEMS    Review of Systems:  Gen:  Denies  fever, sweats, chills weigh loss  HEENT: Denies blurred vision, double vision, ear pain, eye pain, hearing loss, nose bleeds, sore throat Cardiac:  No dizziness, chest pain or heaviness, chest tightness,edema Resp:   Denies cough or sputum porduction, shortness of breath,wheezing, hemoptysis,  Gi: Denies swallowing difficulty, stomach pain, nausea or vomiting, diarrhea, constipation, bowel incontinence Gu:  Denies bladder incontinence, burning urine Ext:   Denies Joint pain, stiffness or swelling Skin: Denies  skin rash, easy bruising or bleeding or hives Endoc:  Denies polyuria, polydipsia , polyphagia or weight change Psych:   Denies depression, insomnia or hallucinations   Other:  All other systems negative   VS: BP (!) 98/50 (BP Location: Left Arm)   Pulse 100   Temp 97.9 F (36.6 C)   Resp (!) 25   SpO2 97%      PHYSICAL EXAM    GENERAL:NAD, no fevers, chills, no weakness no fatigue HEAD: Normocephalic, atraumatic.  EYES: Pupils equal, round, reactive to light. Extraocular muscles intact. No scleral icterus.  MOUTH: Moist mucosal membrane. Dentition intact. No abscess noted.  EAR, NOSE, THROAT: Clear without exudates. No  external lesions.  NECK: Supple. No thyromegaly. No nodules. No JVD.  PULMONARY: no wheezing, not tight CARDIOVASCULAR: S1 and S2. Regular rate and rhythm. 3/6 syst murmurs rubs, or gallops. No edema. Pedal pulses 2+ bilaterally.  GASTROINTESTINAL: Soft, nontender, nondistended. No masses. Positive bowel sounds. No hepatosplenomegaly.  MUSCULOSKELETAL: No swelling, clubbing, or edema. Right leg in imobilize NEUROLOGIC: Cranial nerves II through XII are intact. No gross focal neurological deficits. Sensation intact. Reflexes intact.  SKIN: No ulceration, lesions, rashes, or cyanosis. Skin warm and dry. Turgor intact.  PSYCHIATRIC: Mood, affect within normal limits. The patient is awake, alert and oriented x 3. Insight, judgment intact.       IMAGING    ECHOCARDIOGRAM COMPLETE  Result Date: 03/11/2022    ECHOCARDIOGRAM REPORT   Patient Name:   ORAL HALLGREN Date of Exam: 03/11/2022 Medical Rec #:  [id number]    Height:       55.0 in Accession #:    [id number]   Weight:       144.6 lb Date of Birth:  1946-05-14    BSA:          1.527 m Patient Age:    55 years     BP:           97/70 mmHg Patient Gender: F            HR:           104 bpm. Exam Location:  [facility] Procedure: 2D Echo, Color Doppler and Cardiac Doppler Indications:     I50.31 congestive heart failure-Acute Diastolic  History:         Patient has prior history of Echocardiogram examinations, most                  recent 10/21/2018. CHF, Pacemaker, COPD; Risk                  Factors:Hypertension, Diabetes and HCL.  Sonographer:     Charmayne Sheer Referring Phys:  [id number] [place] TANG Diagnosing Phys: Yolonda Kida MD  Sonographer Comments: Suboptimal apical window. Image acquisition challenging due to COPD, Image acquisition challenging due to respiratory motion and Image acquisition challenging due to patient  body habitus. IMPRESSIONS  1. Left ventricular ejection fraction, by estimation, is 55 to 60%. The left ventricle has normal  function. The left ventricle has no regional wall motion abnormalities. Left ventricular diastolic parameters are consistent with Grade I diastolic dysfunction (impaired relaxation).  2. Right ventricular systolic function is normal. The right ventricular size is normal.  3. Mitral Vavle annulous thickening/Possible Repair. The mitral valve is degenerative. Mild to moderate mitral valve regurgitation.  4. The aortic valve is normal in structure. Aortic valve regurgitation is not visualized. Aortic valve sclerosis/calcification is present, without any evidence of aortic stenosis. FINDINGS  Left Ventricle: Left ventricular ejection fraction, by estimation, is 55 to 60%. The left ventricle has normal function. The left ventricle has no regional wall motion abnormalities. The left ventricular internal cavity size was normal in size. There is  borderline left ventricular hypertrophy. Left ventricular diastolic parameters are consistent with Grade I diastolic dysfunction (impaired relaxation). Right Ventricle: The right ventricular size is normal. No increase in right ventricular wall thickness. Right ventricular systolic function is normal. Left Atrium: Left atrial size was normal in size. Right Atrium: Right atrial size was normal in size. Pericardium: There is no evidence of pericardial effusion. Mitral Valve: Mitral Vavle annulous thickening/Possible Repair. The mitral valve is degenerative in appearance. Mild to moderate mitral valve regurgitation. Tricuspid Valve: The tricuspid valve is normal in structure. Tricuspid valve regurgitation is mild. Aortic Valve: The aortic valve is normal in structure. Aortic valve regurgitation is not visualized. Aortic valve sclerosis/calcification is present, without any evidence of aortic stenosis. Aortic valve mean gradient measures 4.0 mmHg. Aortic valve peak  gradient measures 8.8 mmHg. Aortic valve area, by VTI measures 1.33 cm. Pulmonic Valve: The pulmonic valve was grossly  normal. Pulmonic valve regurgitation is not visualized. Aorta: The ascending aorta was not well visualized. IAS/Shunts: No atrial level shunt detected by color flow Doppler.  LEFT VENTRICLE PLAX 2D LVIDd:         4.72 cm   Diastology LVIDs:         3.27 cm   LV e' lateral:   5.22 cm/s LV PW:         1.23 cm   LV E/e' lateral: 35.9 LV IVS:        0.92 cm LVOT diam:     1.60 cm LV SV:         29 LV SV Index:   19 LVOT Area:     2.01 cm  RIGHT VENTRICLE RV Basal diam:  2.94 cm LEFT ATRIUM             Index        RIGHT ATRIUM          Index LA diam:        3.80 cm 2.49 cm/m   RA Area:     6.96 cm LA Vol (A2C):   53.8 ml 35.24 ml/m  RA Volume:   10.90 ml 7.14 ml/m LA Vol (A4C):   56.5 ml 37.00 ml/m LA Biplane Vol: 56.2 ml 36.81 ml/m  AORTIC VALVE                    PULMONIC VALVE AV Area (Vmax):    1.31 cm     PV Vmax:       0.99 m/s AV Area (Vmean):   1.22 cm     PV Peak grad:  3.9 mmHg AV Area (VTI):     1.33 cm AV Vmax:  148.00 cm/s AV Vmean:          95.300 cm/s AV VTI:            0.217 m AV Peak Grad:      8.8 mmHg AV Mean Grad:      4.0 mmHg LVOT Vmax:         96.10 cm/s LVOT Vmean:        57.900 cm/s LVOT VTI:          0.144 m LVOT/AV VTI ratio: 0.66  AORTA Ao Root diam: 3.20 cm MITRAL VALVE                TRICUSPID VALVE MV Area (PHT): 3.25 cm     TR Peak grad:   40.7 mmHg MV Decel Time: 234 msec     TR Vmax:        319.00 cm/s MV E velocity: 187.50 cm/s                             SHUNTS                             Systemic VTI:  0.14 m                             Systemic Diam: 1.60 cm Yolonda Kida MD Electronically signed by Yolonda Kida MD Signature Date/Time: 03/11/2022/4:38:20 PM    Final    CT Head Wo Contrast  Result Date: 03/11/2022 CLINICAL DATA:  Head trauma, minor (Age >= 65y); Neck trauma (Age >= 65y) EXAM: CT HEAD WITHOUT CONTRAST CT CERVICAL SPINE WITHOUT CONTRAST TECHNIQUE: Multidetector CT imaging of the head and cervical spine was performed following the standard  protocol without intravenous contrast. Multiplanar CT image reconstructions of the cervical spine were also generated. RADIATION DOSE REDUCTION: This exam was performed according to the departmental dose-optimization program which includes automated exposure control, adjustment of the mA and/or kV according to patient size and/or use of iterative reconstruction technique. COMPARISON:  CT head and CT cervical spine October 16, 2020. 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 No evidence of acute large vascular territory infarct, mass lesion, midline shift, or acute hemorrhage. Vascular: No hyperdense vessel identified. Skull: Right posterior vertex scalp contusion without acute calvarial fracture. Sinuses/Orbits: Mild paranasal sinus mucosal thickening. No acute orbital findings. Other: No mastoid effusions. CT CERVICAL SPINE FINDINGS Alignment: Similar anterolisthesis of C4 on C5, C7 on T1 and T1 on T2. No new sagittal subluxation. Skull base and vertebrae: Vertebral body heights are maintained. No evidence of acute fracture. Soft tissues and spinal canal: No prevertebral fluid or swelling. No visible canal hematoma. Disc levels: Similar multilevel degenerative disc disease and multilevel facet/uncovertebral hypertrophy with varying degrees of neural foraminal stenosis. Upper chest: Visualized lung apices are clear. IMPRESSION: 1. No evidence of acute intracranial abnormality. 2. Right posterior vertex scalp contusion without acute calvarial fracture. 3. No evidence of acute fracture or traumatic malalignment in the cervical spine. Electronically Signed   By: Margaretha Sheffield M.D.   On: 03/11/2022 08:27   CT Cervical Spine Wo Contrast  Result Date: 03/11/2022 CLINICAL DATA:  Head trauma, minor (Age >= 65y); Neck trauma (Age >= 65y) EXAM: CT HEAD WITHOUT CONTRAST CT CERVICAL SPINE WITHOUT CONTRAST TECHNIQUE: Multidetector CT imaging of the  head and  cervical spine was performed following the standard protocol without intravenous contrast. Multiplanar CT image reconstructions of the cervical spine were also generated. RADIATION DOSE REDUCTION: This exam was performed according to the departmental dose-optimization program which includes automated exposure control, adjustment of the mA and/or kV according to patient size and/or use of iterative reconstruction technique. COMPARISON:  CT head and CT cervical spine October 16, 2020. 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 No evidence of acute large vascular territory infarct, mass lesion, midline shift, or acute hemorrhage. Vascular: No hyperdense vessel identified. Skull: Right posterior vertex scalp contusion without acute calvarial fracture. Sinuses/Orbits: Mild paranasal sinus mucosal thickening. No acute orbital findings. Other: No mastoid effusions. CT CERVICAL SPINE FINDINGS Alignment: Similar anterolisthesis of C4 on C5, C7 on T1 and T1 on T2. No new sagittal subluxation. Skull base and vertebrae: Vertebral body heights are maintained. No evidence of acute fracture. Soft tissues and spinal canal: No prevertebral fluid or swelling. No visible canal hematoma. Disc levels: Similar multilevel degenerative disc disease and multilevel facet/uncovertebral hypertrophy with varying degrees of neural foraminal stenosis. Upper chest: Visualized lung apices are clear. IMPRESSION: 1. No evidence of acute intracranial abnormality. 2. Right posterior vertex scalp contusion without acute calvarial fracture. 3. No evidence of acute fracture or traumatic malalignment in the cervical spine. Electronically Signed   By: Margaretha Sheffield M.D.   On: 03/11/2022 08:27   DG Chest Portable 1 View  Result Date: 03/11/2022 CLINICAL DATA:  Provided history: Status post reduction. Shortness of breath. EXAM: PORTABLE CHEST 1 VIEW COMPARISON:  Chest CT 10/02/2020. Prior  chest radiographs 10/22/2018 and earlier. FINDINGS: Left chest multi lead implantable cardiac device. Cardiomegaly. Aortic atherosclerosis. Central pulmonary vascular congestion with suspected mild interstitial edema. Chronic elevation of the right hemidiaphragm. Small right pleural effusion. Additional opacity at the right lung base which may reflect atelectasis and/or consolidation. No evidence of pneumothorax. No acute bony abnormality identified. IMPRESSION: Cardiomegaly with central pulmonary vascular congestion and suspected mild interstitial edema. Chronic elevation of the right hemidiaphragm. Small right pleural effusion. Underlying atelectasis and/or consolidation at the right lung base. Aortic Atherosclerosis (ICD10-I70.0). Electronically Signed   By: Kellie Simmering D.O.   On: 03/11/2022 08:15   DG Ankle 2 Views Right  Result Date: 03/11/2022 CLINICAL DATA:  Status post reduction. EXAM: RIGHT ANKLE - 2 VIEW COMPARISON:  March 11, 2022 FINDINGS: Redemonstrated is comminuted impacted fracture of the distal right tibia. Persistent lateral displacement of the main distal fracture fragment and approximately 1.3 cm impaction. Butterfly fragment is seen posterior to the oblique fracture line. Similar comminuted fracture of the distal right fibula with persistent impaction and angulation. Associated soft tissue swelling. Cast material overlies the soft tissues and obscures details. IMPRESSION: Persistent comminuted impacted fractures of the distal right tibia and fibula. Persistent lateral displacement of the main distal fracture fragment of the tibia and approximately 1.3 cm impaction. Electronically Signed   By: Fidela Salisbury M.D.   On: 03/11/2022 08:15   DG Hip Unilat W or Wo Pelvis 2-3 Views Right  Result Date: 03/11/2022 CLINICAL DATA:  Status post fall. EXAM: DG HIP (WITH OR WITHOUT PELVIS) 2-3V RIGHT COMPARISON:  05/24/21 FINDINGS: The bones appear osteopenic. Degenerative changes noted within the  imaged portions of the lumbar spine. Signs of previous IM nail fixation of the right femur. No signs of acute fracture or dislocation. Mild bilateral and symmetric osteoarthritis noted within the hips. IMPRESSION: 1. No acute  findings. 2. 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 Electronically Signed   By: Kerby Moors M.D.   On: 03/11/2022 05:42   DG Ankle Complete Right  Result Date: 03/11/2022 CLINICAL DATA:  Status post fall. EXAM: RIGHT ANKLE - COMPLETE 3+ VIEW COMPARISON:  None Available. FINDINGS: The bones appear osteopenic. There is diffuse soft tissue swelling. Vascular calcifications are identified there are acute, comminuted fracture deformities involving the distal diaphysis of the fibula and tibia. Lateral angulation of the distal fracture fragments identified. Mild lateral and dorsal displacement of the distal fracture fragments also noted. IMPRESSION: 1. Acute, comminuted fracture deformities involve the distal diaphysis of the fibula and tibia. Lateral angulation of the distal fracture fragments. 2. Soft tissue swelling. Electronically Signed   By: Kerby Moors M.D.   On: 03/11/2022 05:41      ASSESSMENT/PLAN   This ia 76 yr old, fell at home, fractured lower extremity, somnolent and hypercapneic, hypoventilation, narcotic induce, no critical head trauma. Base on her habitus, suspect obstructive sleep apnea. Off bipap. Alert, on oxygen, mentation appropriate. Known hx of copd.  -cautious pain control -anti aspiration measures -out patient sleep study if not already done    Mild pulm edema -agree with echo -cautious diresis   Small right pleural effusion. ? Cardiac but more concern of tramatic effusion. LVF is good -follow up cxr -if enlarging thoracentesis will be ordered    Acute communicated fracture involving the right distal diaphysis of the fibula and tibia, to or today. Pulmonary wise cleared -ideally recommend sinal block for her     Thank you  for allowing me to participate in the care of this patient.   Patient/Family are satisfied with care plan and all questions have been answered.  This document was prepared using Dragon voice recognition software and may include unintentional dictation errors.     Wallene Huh, M.D.  Division of [facility]

## 2022-03-12 NOTE — Anesthesia Procedure Notes (Signed)
Procedure Name: Intubation Date/Time: 03/12/2022 2:50 PM  Performed by: Jonna Clark, CRNAPre-anesthesia Checklist: Patient identified, Patient being monitored, Timeout performed, Emergency Drugs available and Suction available Patient Re-evaluated:Patient Re-evaluated prior to induction Oxygen Delivery Method: Circle system utilized Preoxygenation: Pre-oxygenation with 100% oxygen Induction Type: IV induction Ventilation: Mask ventilation without difficulty Laryngoscope Size: 3 and McGraph Grade View: Grade I Tube type: Oral Tube size: 6.5 mm Number of attempts: 1 Airway Equipment and Method: Stylet Placement Confirmation: ETT inserted through vocal cords under direct vision, positive ETCO2 and breath sounds checked- equal and bilateral Secured at: 19 cm Tube secured with: Tape Dental Injury: Teeth and Oropharynx as per pre-operative assessment

## 2022-03-12 NOTE — Progress Notes (Addendum)
   03/12/22 2145  Provider Notification  Provider Name/Title Sharion Settler  Date Provider Notified 03/12/22  Time Provider Notified 2150  Method of Notification Page  Notification Reason Change in status (Hypotension and somnolence)  Provider response See new orders  Date of Provider Response 03/12/22  Time of Provider Response 2155    Leading up to this event patient prior to transport to unit patient was given 50 of fentanyl. Was slightly lethargic bit easily awaken. By 2000 patient was alert and orientated x4 in addition BP had was improved. Scheduled medications were given including: trazodone and lasix. Write believed that Lasix was suppose to be given due decreased urine output. Bipap was off for medication administration and patient asked for a little break. Bipap off for less then 30 min. Continuous fluids were infusing already prior to hypotension. Randol Kern APP was notified and 250 bolus ordered and given. In addition albumin ordered and started.   Addendum: provider was secure chatted the information.

## 2022-03-12 NOTE — Anesthesia Preprocedure Evaluation (Signed)
Anesthesia Evaluation  Patient identified by MRN, date of birth, ID band Patient awake    Reviewed: Allergy & Precautions, NPO status , Patient's Chart, lab work & pertinent test results  History of Anesthesia Complications Negative for: history of anesthetic complications  Airway Mallampati: III  TM Distance: >3 FB Neck ROM: full    Dental  (+) Missing, Poor Dentition   Pulmonary neg shortness of breath, COPD,  COPD inhaler,    Pulmonary exam normal        Cardiovascular hypertension, (-) angina+ CAD and +CHF  Normal cardiovascular exam+ dysrhythmias + pacemaker + Valvular Problems/Murmurs MR      Neuro/Psych PSYCHIATRIC DISORDERS negative neurological ROS     GI/Hepatic Neg liver ROS, GERD  ,  Endo/Other  negative endocrine ROSdiabetes  Renal/GU      Musculoskeletal   Abdominal   Peds  Hematology  (+) Blood dyscrasia, anemia ,   Anesthesia Other Findings Past Medical History: No date: Anginal pain ([place]) No date: Anxiety No date: Arthritis     Comment:  RA No date: Asthma No date: Brain tumor (benign) (HCC) No date: CHF (congestive heart failure) (HCC) No date: Collagen vascular disease (HCC) No date: Complete heart block (HCC) No date: COPD (chronic obstructive pulmonary disease) (HCC) No date: Coronary artery disease No date: DDD (degenerative disc disease) No date: Depression No date: Diabetes mellitus No date: GERD (gastroesophageal reflux disease) No date: Headache No date: Heart murmur No date: Hypercholesteremia No date: Hypertension No date: Lumbar degenerative disc disease No date: Migraines No date: Obesity No date: Osteopenia No date: Osteoporosis No date: Pacemaker- [employer] No date: Pneumonia No date: PTSD (post-traumatic stress disorder)  Past Surgical History: No date: ABDOMINAL HYSTERECTOMY No date: CERVICAL FUSION No date: CHOLECYSTECTOMY 09/20/2015: COLONOSCOPY WITH PROPOFOL;  N/A     Comment:  Procedure: COLONOSCOPY WITH PROPOFOL;  Surgeon: Josefine Class, MD;  Location: [facility] ENDOSCOPY;  Service:               Endoscopy;  Laterality: N/A; 01/27/2016: COLONOSCOPY WITH PROPOFOL; N/A     Comment:  Procedure: COLONOSCOPY WITH PROPOFOL;  Surgeon: Manya Silvas, MD;  Location: [facility] ENDOSCOPY;  Service:               Endoscopy;  Laterality: N/A; 02/23/2012: FINGER ARTHROPLASTY     Comment:  Procedure: FINGER ARTHROPLASTY;  Surgeon: Cammie Sickle., MD;  Location: [place];                Service: Orthopedics;  Laterality: Left;  Extensor carpi               radialis longus to Extensor carpi ulnaris transfer, left               Metaphalangeal reconstructions of index and long fingers, No date: FOOT ARTHROPLASTY     Comment:  toes x2 rt foot 2011: HAND RECONSTRUCTION     Comment:  right-multiple finger joint reconst No date: JOINT REPLACEMENT     Comment:  bilat knee replacements 06/14/2017: KYPHOPLASTY; N/A     Comment:  Procedure: KYPHOPLASTY L1;  Surgeon: Hessie Knows, MD;               Location: [facility] ORS;  Service: Orthopedics;  Laterality:               N/A; 06/23/2017: LOOP RECORDER INSERTION; N/A     Comment:  Procedure: LOOP RECORDER INSERTION;  Surgeon: Isaias Cowman, MD;  Location: [place] [facility];  Service:              Cardiovascular;  Laterality: N/A; 10/21/2018: LOOP RECORDER REMOVAL; N/A     Comment:  Procedure: LOOP RECORDER REMOVAL;  Surgeon: Deboraha Sprang, MD;  Location: [place] [facility];  Service:               Cardiovascular;  Laterality: N/A; 10/22/2018: ORIF FEMUR FRACTURE; Right     Comment:  Procedure: OPEN REDUCTION INTERNAL FIXATION (ORIF)               DISTAL FEMUR FRACTURE;  Surgeon: Marchia Bond, MD;                Location: [place];  Service: Orthopedics;  Laterality:               Right; 10/21/2018: PACEMAKER IMPLANT; N/A      Comment:  Procedure: PACEMAKER IMPLANT;  Surgeon: Deboraha Sprang,              MD;  Location: [place] [facility];  Service:               Cardiovascular;  Laterality: N/A;     Reproductive/Obstetrics negative OB ROS                            Anesthesia Physical Anesthesia Plan  ASA: 3  Anesthesia Plan: General/Spinal   Post-op Pain Management:    Induction:   PONV Risk Score and Plan: 3 and Propofol infusion and Ondansetron  Airway Management Planned: Nasal Cannula  Additional Equipment:   Intra-op Plan:   Post-operative Plan:   Informed Consent: I have reviewed the patients History and Physical, chart, labs and discussed the procedure including the risks, benefits and alternatives for the proposed anesthesia with the patient or authorized representative who has indicated his/her understanding and acceptance.     Dental Advisory Given  Plan Discussed with: Anesthesiologist, CRNA and Surgeon  Anesthesia Plan Comments: (Patient reports no bleeding problems and no anticoagulant use.  Plan for spinal with backup GA  Patient consented for risks of anesthesia including but not limited to:  - adverse reactions to medications - damage to eyes, teeth, lips or other oral mucosa - nerve damage due to positioning  - risk of bleeding, infection and or nerve damage from spinal that could lead to paralysis - risk of headache or failed spinal - damage to teeth, lips or other oral mucosa - sore throat or hoarseness - damage to heart, brain, nerves, lungs, other parts of body or loss of life  Patient voiced understanding.)        Anesthesia Quick Evaluation

## 2022-03-12 NOTE — Transfer of Care (Signed)
Immediate Anesthesia Transfer of Care Note  Patient: Meghan [name]  Procedure(s) Performed: OPEN REDUCTION INTERNAL FIXATION (ORIF) TIBIA/FIBULA FRACTURE (Right: Leg Lower)  Patient Location: PACU  Anesthesia Type:General  Level of Consciousness: awake and patient cooperative  Airway & Oxygen Therapy: Patient Spontanous Breathing and placed on BiPAP to prevent post op hypercapnia  Post-op Assessment: Report given to RN and Post -op Vital signs reviewed and stable  Post vital signs: Reviewed and stable  Last Vitals:  Vitals Value Taken Time  BP 138/113 03/12/22 1814  Temp 36.1 C 03/12/22 1814  Pulse 87 03/12/22 1818  Resp 20 03/12/22 1818  SpO2 96 % 03/12/22 1818  Vitals shown include unvalidated device data.  Last Pain:  Vitals:   03/12/22 1316  TempSrc:   PainSc: 8       Patients Stated Pain Goal: 2 (86/57/84 6962)  Complications: No notable events documented.

## 2022-03-12 NOTE — Anesthesia Postprocedure Evaluation (Signed)
Anesthesia Post Note  Patient: Meghan [name]  Procedure(s) Performed: OPEN REDUCTION INTERNAL FIXATION (ORIF) TIBIA/FIBULA FRACTURE (Right: Leg Lower)  Patient location during evaluation: PACU Anesthesia Type: Combined General/Spinal Level of consciousness: awake and alert Pain management: pain level controlled Vital Signs Assessment: post-procedure vital signs reviewed and stable Respiratory status: spontaneous breathing, nonlabored ventilation, respiratory function stable and patient connected to face mask oxygen (BiPAP) Cardiovascular status: blood pressure returned to baseline and stable Postop Assessment: no apparent nausea or vomiting Anesthetic complications: no   No notable events documented.   Last Vitals:  Vitals:   03/12/22 1915 03/12/22 1917  BP:  (!) 90/52  Pulse: 67   Resp: 13   Temp:    SpO2: 98%     Last Pain:  Vitals:   03/12/22 1905  TempSrc:   PainSc: 10-Worst pain ever                 Precious Haws Farran Amsden

## 2022-03-12 NOTE — OR Nursing (Signed)
Pt. Skin assessment- Red rash, broken, damp, irritated skin on pt. groin bilaterally.  Dried with towel, MD notified.  Red Rash under pt. panus.  Small skin tear on pt. labia, noticed when placing foley. CRNA and MD notified.

## 2022-03-12 NOTE — Progress Notes (Signed)
Subjective:  Patient seen in the preoperative area.  Patient denies any significant pain in the right leg.  Patient has been cleared medically for surgery.  Patient evaluated by cardiology, pulmonary and medicine.  Objective:   VITALS:   Vitals:   03/12/22 0838 03/12/22 0840 03/12/22 1154 03/12/22 1316  BP: 95/61 (!) 98/50 105/60 (!) 90/52  Pulse: 100  87 84  Resp: 20 (!) '25 20 16  '$ Temp: 97.9 F (36.6 C)  98.9 F (37.2 C) 99.4 F (37.4 C)  TempSrc:      SpO2: 97%  100% 100%  Weight:    65.3 kg  Height:    '4\' 7"'$  (1.397 m)    PHYSICAL EXAM: Right lower extremity: AO splint in place. Compartments soft and compressible. Patient can flex and extend the toes on the right foot, she has intact sensation light touch all 5 toes and her toes are well-perfused.   LABS  Results for orders placed or performed during the hospital encounter of 03/11/22 (from the past 24 hour(s))  Blood gas, arterial     Status: Abnormal   Collection Time: 03/11/22  2:59 PM  Result Value Ref Range   FIO2 35 %   Delivery systems BILEVEL POSITIVE AIRWAY PRESSURE    PEEP 0 cm H20   Inspiratory PAP 18 cmH2O   Expiratory PAP 5 cmH2O   pH, Arterial 7.31 (L) 7.35 - 7.45   pCO2 arterial 69 (HH) 32 - 48 mmHg   pO2, Arterial 89 83 - 108 mmHg   Bicarbonate 34.7 (H) 20.0 - 28.0 mmol/L   Acid-Base Excess 6.0 (H) 0.0 - 2.0 mmol/L   O2 Saturation 98.8 %   Patient temperature 37.0    Collection site LEFT RADIAL    Allens test (pass/fail) PASS PASS   Spontaneous VT 347 mL  CBG monitoring, ED     Status: None   Collection Time: 03/11/22  4:21 PM  Result Value Ref Range   Glucose-Capillary 98 70 - 99 mg/dL  Glucose, capillary     Status: None   Collection Time: 03/11/22  8:08 PM  Result Value Ref Range   Glucose-Capillary 87 70 - 99 mg/dL  Glucose, capillary     Status: None   Collection Time: 03/11/22 11:51 PM  Result Value Ref Range   Glucose-Capillary 90 70 - 99 mg/dL  Basic metabolic panel     Status:  Abnormal   Collection Time: 03/12/22  4:39 AM  Result Value Ref Range   Sodium 141 135 - 145 mmol/L   Potassium 3.0 (L) 3.5 - 5.1 mmol/L   Chloride 104 98 - 111 mmol/L   CO2 28 22 - 32 mmol/L   Glucose, Bld 73 70 - 99 mg/dL   BUN 13 8 - 23 mg/dL   Creatinine, Ser 0.82 0.44 - 1.00 mg/dL   Calcium 8.3 (L) 8.9 - 10.3 mg/dL   GFR, Estimated >60 >60 mL/min   Anion gap 9 5 - 15  CBC     Status: Abnormal   Collection Time: 03/12/22  4:39 AM  Result Value Ref Range   WBC 3.2 (L) 4.0 - 10.5 K/uL   RBC 3.47 (L) 3.87 - 5.11 MIL/uL   Hemoglobin 11.1 (L) 12.0 - 15.0 g/dL   HCT 33.1 (L) 36.0 - 46.0 %   MCV 95.4 80.0 - 100.0 fL   MCH 32.0 26.0 - 34.0 pg   MCHC 33.5 30.0 - 36.0 g/dL   RDW 13.1 11.5 - 15.5 %  Platelets 105 (L) 150 - 400 K/uL   nRBC 0.0 0.0 - 0.2 %  Glucose, capillary     Status: Abnormal   Collection Time: 03/12/22  5:42 AM  Result Value Ref Range   Glucose-Capillary 69 (L) 70 - 99 mg/dL  Glucose, capillary     Status: None   Collection Time: 03/12/22  5:44 AM  Result Value Ref Range   Glucose-Capillary 79 70 - 99 mg/dL  Glucose, capillary     Status: None   Collection Time: 03/12/22  8:31 AM  Result Value Ref Range   Glucose-Capillary 71 70 - 99 mg/dL  Glucose, capillary     Status: Abnormal   Collection Time: 03/12/22 11:52 AM  Result Value Ref Range   Glucose-Capillary 104 (H) 70 - 99 mg/dL  Glucose, capillary     Status: None   Collection Time: 03/12/22  1:08 PM  Result Value Ref Range   Glucose-Capillary 76 70 - 99 mg/dL    ECHOCARDIOGRAM COMPLETE  Result Date: 03/11/2022    ECHOCARDIOGRAM REPORT   Patient Name:   Meghan [name] [facility] Date of Exam: 03/11/2022 Medical Rec #:  [id number]    Height:       55.0 in Accession #:    [id number]   Weight:       144.6 lb Date of Birth:  1945/11/08    BSA:          1.527 m Patient Age:    76 years     BP:           97/70 mmHg Patient Gender: F            HR:           104 bpm. Exam Location:  [facility] Procedure: 2D Echo, Color Doppler  and Cardiac Doppler Indications:     I50.31 congestive heart failure-Acute Diastolic  History:         Patient has prior history of Echocardiogram examinations, most                  recent 10/21/2018. CHF, Pacemaker, COPD; Risk                  Factors:Hypertension, Diabetes and HCL.  Sonographer:     Charmayne Sheer Referring Phys:  [id number] [place] TANG Diagnosing Phys: Yolonda Kida MD  Sonographer Comments: Suboptimal apical window. Image acquisition challenging due to COPD, Image acquisition challenging due to respiratory motion and Image acquisition challenging due to patient body habitus. IMPRESSIONS  1. Left ventricular ejection fraction, by estimation, is 55 to 60%. The left ventricle has normal function. The left ventricle has no regional wall motion abnormalities. Left ventricular diastolic parameters are consistent with Grade I diastolic dysfunction (impaired relaxation).  2. Right ventricular systolic function is normal. The right ventricular size is normal.  3. Mitral Vavle annulous thickening/Possible Repair. The mitral valve is degenerative. Mild to moderate mitral valve regurgitation.  4. The aortic valve is normal in structure. Aortic valve regurgitation is not visualized. Aortic valve sclerosis/calcification is present, without any evidence of aortic stenosis. FINDINGS  Left Ventricle: Left ventricular ejection fraction, by estimation, is 55 to 60%. The left ventricle has normal function. The left ventricle has no regional wall motion abnormalities. The left ventricular internal cavity size was normal in size. There is  borderline left ventricular hypertrophy. Left ventricular diastolic parameters are consistent with Grade I diastolic dysfunction (impaired relaxation). Right Ventricle: The right ventricular size is normal. No increase  in right ventricular wall thickness. Right ventricular systolic function is normal. Left Atrium: Left atrial size was normal in size. Right Atrium: Right atrial  size was normal in size. Pericardium: There is no evidence of pericardial effusion. Mitral Valve: Mitral Vavle annulous thickening/Possible Repair. The mitral valve is degenerative in appearance. Mild to moderate mitral valve regurgitation. Tricuspid Valve: The tricuspid valve is normal in structure. Tricuspid valve regurgitation is mild. Aortic Valve: The aortic valve is normal in structure. Aortic valve regurgitation is not visualized. Aortic valve sclerosis/calcification is present, without any evidence of aortic stenosis. Aortic valve mean gradient measures 4.0 mmHg. Aortic valve peak  gradient measures 8.8 mmHg. Aortic valve area, by VTI measures 1.33 cm. Pulmonic Valve: The pulmonic valve was grossly normal. Pulmonic valve regurgitation is not visualized. Aorta: The ascending aorta was not well visualized. IAS/Shunts: No atrial level shunt detected by color flow Doppler.  LEFT VENTRICLE PLAX 2D LVIDd:         4.72 cm   Diastology LVIDs:         3.27 cm   LV e' lateral:   5.22 cm/s LV PW:         1.23 cm   LV E/e' lateral: 35.9 LV IVS:        0.92 cm LVOT diam:     1.60 cm LV SV:         29 LV SV Index:   19 LVOT Area:     2.01 cm  RIGHT VENTRICLE RV Basal diam:  2.94 cm LEFT ATRIUM             Index        RIGHT ATRIUM          Index LA diam:        3.80 cm 2.49 cm/m   RA Area:     6.96 cm LA Vol (A2C):   53.8 ml 35.24 ml/m  RA Volume:   10.90 ml 7.14 ml/m LA Vol (A4C):   56.5 ml 37.00 ml/m LA Biplane Vol: 56.2 ml 36.81 ml/m  AORTIC VALVE                    PULMONIC VALVE AV Area (Vmax):    1.31 cm     PV Vmax:       0.99 m/s AV Area (Vmean):   1.22 cm     PV Peak grad:  3.9 mmHg AV Area (VTI):     1.33 cm AV Vmax:           148.00 cm/s AV Vmean:          95.300 cm/s AV VTI:            0.217 m AV Peak Grad:      8.8 mmHg AV Mean Grad:      4.0 mmHg LVOT Vmax:         96.10 cm/s LVOT Vmean:        57.900 cm/s LVOT VTI:          0.144 m LVOT/AV VTI ratio: 0.66  AORTA Ao Root diam: 3.20 cm MITRAL VALVE                 TRICUSPID VALVE MV Area (PHT): 3.25 cm     TR Peak grad:   40.7 mmHg MV Decel Time: 234 msec     TR Vmax:        319.00 cm/s MV E velocity: 187.50 cm/s  SHUNTS                             Systemic VTI:  0.14 m                             Systemic Diam: 1.60 cm Yolonda Kida MD Electronically signed by Yolonda Kida MD Signature Date/Time: 03/11/2022/4:38:20 PM    Final    CT Head Wo Contrast  Result Date: 03/11/2022 CLINICAL DATA:  Head trauma, minor (Age >= 65y); Neck trauma (Age >= 65y) EXAM: CT HEAD WITHOUT CONTRAST CT CERVICAL SPINE WITHOUT CONTRAST TECHNIQUE: Multidetector CT imaging of the head and cervical spine was performed following the standard protocol without intravenous contrast. Multiplanar CT image reconstructions of the cervical spine were also generated. RADIATION DOSE REDUCTION: This exam was performed according to the departmental dose-optimization program which includes automated exposure control, adjustment of the mA and/or kV according to patient size and/or use of iterative reconstruction technique. COMPARISON:  CT head and CT cervical spine October 16, 2020. 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 No evidence of acute large vascular territory infarct, mass lesion, midline shift, or acute hemorrhage. Vascular: No hyperdense vessel identified. Skull: Right posterior vertex scalp contusion without acute calvarial fracture. Sinuses/Orbits: Mild paranasal sinus mucosal thickening. No acute orbital findings. Other: No mastoid effusions. CT CERVICAL SPINE FINDINGS Alignment: Similar anterolisthesis of C4 on C5, C7 on T1 and T1 on T2. No new sagittal subluxation. Skull base and vertebrae: Vertebral body heights are maintained. No evidence of acute fracture. Soft tissues and spinal canal: No prevertebral fluid or swelling. No visible canal hematoma. Disc levels: Similar multilevel  degenerative disc disease and multilevel facet/uncovertebral hypertrophy with varying degrees of neural foraminal stenosis. Upper chest: Visualized lung apices are clear. IMPRESSION: 1. No evidence of acute intracranial abnormality. 2. Right posterior vertex scalp contusion without acute calvarial fracture. 3. No evidence of acute fracture or traumatic malalignment in the cervical spine. Electronically Signed   By: Margaretha Sheffield M.D.   On: 03/11/2022 08:27   CT Cervical Spine Wo Contrast  Result Date: 03/11/2022 CLINICAL DATA:  Head trauma, minor (Age >= 65y); Neck trauma (Age >= 65y) EXAM: CT HEAD WITHOUT CONTRAST CT CERVICAL SPINE WITHOUT CONTRAST TECHNIQUE: Multidetector CT imaging of the head and cervical spine was performed following the standard protocol without intravenous contrast. Multiplanar CT image reconstructions of the cervical spine were also generated. RADIATION DOSE REDUCTION: This exam was performed according to the departmental dose-optimization program which includes automated exposure control, adjustment of the mA and/or kV according to patient size and/or use of iterative reconstruction technique. COMPARISON:  CT head and CT cervical spine October 16, 2020. 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 No evidence of acute large vascular territory infarct, mass lesion, midline shift, or acute hemorrhage. Vascular: No hyperdense vessel identified. Skull: Right posterior vertex scalp contusion without acute calvarial fracture. Sinuses/Orbits: Mild paranasal sinus mucosal thickening. No acute orbital findings. Other: No mastoid effusions. CT CERVICAL SPINE FINDINGS Alignment: Similar anterolisthesis of C4 on C5, C7 on T1 and T1 on T2. No new sagittal subluxation. Skull base and vertebrae: Vertebral body heights are maintained. No evidence of acute fracture. Soft tissues and spinal canal: No prevertebral fluid or swelling. No  visible canal hematoma. Disc levels: Similar multilevel degenerative disc disease  and multilevel facet/uncovertebral hypertrophy with varying degrees of neural foraminal stenosis. Upper chest: Visualized lung apices are clear. IMPRESSION: 1. No evidence of acute intracranial abnormality. 2. Right posterior vertex scalp contusion without acute calvarial fracture. 3. No evidence of acute fracture or traumatic malalignment in the cervical spine. Electronically Signed   By: Margaretha Sheffield M.D.   On: 03/11/2022 08:27   DG Chest Portable 1 View  Result Date: 03/11/2022 CLINICAL DATA:  Provided history: Status post reduction. Shortness of breath. EXAM: PORTABLE CHEST 1 VIEW COMPARISON:  Chest CT 10/02/2020. Prior chest radiographs 10/22/2018 and earlier. FINDINGS: Left chest multi lead implantable cardiac device. Cardiomegaly. Aortic atherosclerosis. Central pulmonary vascular congestion with suspected mild interstitial edema. Chronic elevation of the right hemidiaphragm. Small right pleural effusion. Additional opacity at the right lung base which may reflect atelectasis and/or consolidation. No evidence of pneumothorax. No acute bony abnormality identified. IMPRESSION: Cardiomegaly with central pulmonary vascular congestion and suspected mild interstitial edema. Chronic elevation of the right hemidiaphragm. Small right pleural effusion. Underlying atelectasis and/or consolidation at the right lung base. Aortic Atherosclerosis (ICD10-I70.0). Electronically Signed   By: Kellie Simmering D.O.   On: 03/11/2022 08:15   DG Ankle 2 Views Right  Result Date: 03/11/2022 CLINICAL DATA:  Status post reduction. EXAM: RIGHT ANKLE - 2 VIEW COMPARISON:  March 11, 2022 FINDINGS: Redemonstrated is comminuted impacted fracture of the distal right tibia. Persistent lateral displacement of the main distal fracture fragment and approximately 1.3 cm impaction. Butterfly fragment is seen posterior to the oblique fracture line. Similar  comminuted fracture of the distal right fibula with persistent impaction and angulation. Associated soft tissue swelling. Cast material overlies the soft tissues and obscures details. IMPRESSION: Persistent comminuted impacted fractures of the distal right tibia and fibula. Persistent lateral displacement of the main distal fracture fragment of the tibia and approximately 1.3 cm impaction. Electronically Signed   By: Fidela Salisbury M.D.   On: 03/11/2022 08:15   DG Hip Unilat W or Wo Pelvis 2-3 Views Right  Result Date: 03/11/2022 CLINICAL DATA:  Status post fall. EXAM: DG HIP (WITH OR WITHOUT PELVIS) 2-3V RIGHT COMPARISON:  05/24/21 FINDINGS: The bones appear osteopenic. Degenerative changes noted within the imaged portions of the lumbar spine. Signs of previous IM nail fixation of the right femur. No signs of acute fracture or dislocation. Mild bilateral and symmetric osteoarthritis noted within the hips. IMPRESSION: 1. No acute findings. 2. 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 Electronically Signed   By: Kerby Moors M.D.   On: 03/11/2022 05:42   DG Ankle Complete Right  Result Date: 03/11/2022 CLINICAL DATA:  Status post fall. EXAM: RIGHT ANKLE - COMPLETE 3+ VIEW COMPARISON:  None Available. FINDINGS: The bones appear osteopenic. There is diffuse soft tissue swelling. Vascular calcifications are identified there are acute, comminuted fracture deformities involving the distal diaphysis of the fibula and tibia. Lateral angulation of the distal fracture fragments identified. Mild lateral and dorsal displacement of the distal fracture fragments also noted. IMPRESSION: 1. Acute, comminuted fracture deformities involve the distal diaphysis of the fibula and tibia. Lateral angulation of the distal fracture fragments. 2. Soft tissue swelling. Electronically Signed   By: Kerby Moors M.D.   On: 03/11/2022 05:41    Assessment/Plan: Day of Surgery   Principal Problem:   Fall Active  Problems:   HTN (hypertension)   GERD (gastroesophageal reflux disease)   Depression   Hypokalemia   Chronic obstructive pulmonary disease (HCC)   Diabetes mellitus ([place])  Seropositive rheumatoid arthritis (HCC)   Seizure (HCC)   Ankle fracture, right   Acute on chronic diastolic CHF (congestive heart failure) ([place])   AMS (altered mental status)   Pancytopenia ([place])  I reviewed the details of the operation as well as the postoperative course with the patient.  I answered all her questions today.  I reviewed the risk and benefits of the procedure.  Patient was in agreement with the plan for surgical fixation of her right distal tibia and fibula fractures.    Thornton Park , MD 03/12/2022, 2:33 PM

## 2022-03-12 NOTE — Progress Notes (Signed)
Tylenol 1000 mg administered for pain

## 2022-03-12 NOTE — Op Note (Addendum)
03/12/2022  6:30 PM  PATIENT:  Meghan [name]    PRE-OPERATIVE DIAGNOSIS:  Right distal tibia/fibula fracture  POST-OPERATIVE DIAGNOSIS:  Same  PROCEDURE:  OPEN REDUCTION INTERNAL FIXATION (ORIF) RIGHT DISTAL TIBIA/FIBULA FRACTURE  SURGEON:  Thornton Park, MD  ANESTHESIA:   General after failed attempt at spinal  PREOPERATIVE INDICATIONS:  Meghan [name] is a  76 y.o. female with a diagnosis of a displaced right distal tibis/fibula fracture.  Given the displacement of the fracture, patient was recommended for open reduction internal fixation.  The patient's tibial fracture was too distal to consider an intramedullary rod.  Patient was evaluated preoperatively by the hospitalist, cardiology and pulmonary services.  Patient was cleared medically prior to surgery.  I discussed the risks and benefits of surgery. The risks include but are not limited to infection, bleeding requiring blood transfusion, nerve or blood vessel injury, joint stiffness or loss of motion, persistent pain, weakness or instability, malunion, nonunion and hardware failure and the need for further surgery. Medical risks include but are not limited to DVT and pulmonary embolism, myocardial infarction, stroke, pneumonia, respiratory failure and death. Patient understood these risks and wished to proceed.   OPERATIVE IMPLANTS: Biomet Zimmer distal medial tibial plate and Biomet Zimmer one third tubular plate for fibular fixation  OPERATIVE FINDINGS: Displaced tibia and fibula fractures with significant comminution of the fibula.  There was valgus angulation to both fractures prior to the onset of the case.  OPERATIVE PROCEDURE: Patient was met in the preoperative area.  The right leg was marked according the hospital's correct site of surgery protocol.  Patient was brought to the operating room.  The anesthesia service attempted to place a spinal, but was unsuccessful.  Therefore the patient underwent general endotracheal  intubation.  Patient had a tourniquet applied to the right upper thigh.  A Foley catheter was placed.  Patient had a medial fracture blister above the tibial fracture.  The fracture blister ruptured during draping.  The skin was closed and this was a closed fracture.  Patient's compartments were soft and compressible.  Patient distally was neurovascular intact.  Patient was prepped with ChloraPrep given the patient's allergy to iodine.    A timeout was performed to verify the patient's name, date of birth, medical record number, correct site of surgery and correct procedure to be performed.  The timeout was also used to verify the patient received antibiotics and that all appropriate surgical instruments and implants were available in the room.  Once all in attendance were in agreement the case began.  The tourniquet was inflated without exsanguination to 250 mmHg.  Tourniquet was inflated for a total of 130 minutes.  A calcaneal traction pin was placed making 2 small stab incisions over the mid body of the calcaneus.  The K wire was passed through the calcaneus and placed into the holder on the tibial traction triangle.  Traction was applied.  The fracture reduction was evaluated with C arm imaging.  Slight external rotation was applied to the tibia to reduce the fracture.    Once the fracture was in acceptable position a longitudinal incision was made over the medial malleolus taking care not to extend the incision into the area of the fracture blister.  A second longitudinal incision was made superior to the fracture blister in line with the distal incision.  These 2 incisional windows were used to reduce the fracture and advanced the medial distal tibial plate into position.  Full-thickness skin flaps were developed  taking care to avoid injury to neurovascular structures.  All bleeding vessels were cauterized.  A periosteal elevator was used to tunnel a path for the medial plate mobilizing the soft tissue  off the surface of the tibia medially.  A Biomet Zimmer medial tibial plate was then advanced along the surface of the medial tibia and provisionally fixed with K wires.  C-arm imaging was used to confirm tibia fracture reduction and plate position.  Once the fracture and plate were well positioned, a single distal bicortical screw was placed through the medial tibial plate.  A proximal bicortical screw was also placed to reduce the plate to the tibia.  Again fracture and plate position were confirmed on C-arm imaging in both the AP and lateral planes.  The fracture was deemed to be near anatomically reduced.  Additional distal metaphyseal locking and nonlocking screws were placed including two multiaxial screws, aimed proximally, in the distal most parallel screws holes.  Three additional proximal bicortical screws were also placed to fix the plate to the tibia, bridging the fracture.  C-arm images were again taken in the AP and lateral planes to confirm fracture reduction and plate position.  The attention was then turned to fixation of the fibula.  The patient's leg was taken out of the tibial traction triangle.  A lateral incision was made centered over the fibula.  Full-thickness skin flaps were developed.  The superficial peroneal nerve was identified within the incision and protected with a self-retaining retractor.  A periosteal elevator mobilize soft tissue off the lateral fibula.  The fibula fracture was manually reduced.  An 8 hole one third tubular plate was placed alongside the lateral fibula.  Two bicortical screws were placed, one just distal to the fracture and one proximal to the fracture reducing the plate to the fibula.  C arm imaging was used to confirm adequate reduction of the fibula as well to confirm that the plate was well-positioned.  Two additional proximal bicortical screws and two distal cancellous screws were then placed into the lateral malleolus.  Final C-arm images were taken of  the fixation construct in the AP and lateral planes.  The mortise was symmetric and there is no widening the syndesmosis.  There is no screw penetration into the syndesmosis.  There is no penetration of screws into the ankle joint.  The wounds were then copiously irrigated.  The 2 medial incisions were closed with 2-0 Vicryl and staples.  The calcaneal traction pin was cleaned with alcohol for sterilization as the patient had an allergy to iodine.  The patient was then removed with a drill.  The 2 stab incisions for the calcaneal pin were closed with 3-0 nylon.  The lateral wound was then copiously irrigated.  The lateral wound was closed with a combination of 3-0 nylon and staples.  Patient was injected with half percent Marcaine plain in the medial and lateral ankle incisions as well as into the right ankle joint to assist with postoperative pain management.  Dry sterile dressings were applied along with a posterior splint a second Ortho-Glass used as a stirrup to stabilize the fracture in the medial and lateral direction.  Patient was then awoken and brought to the PACU in stable condition.  I scrubbed and present for the entire case.  All sharp, sponge and instrument counts were correct at the conclusion the case.  I spoke with Tenna Child, the patient's relative, at the patient's request to let her know that the surgery was performed  successfully and the patient was stable in the recovery room.

## 2022-03-12 NOTE — Progress Notes (Signed)
Patient was seen in PACU by Dr. Lupita Dawn request as she was becoming hypotensive postoperatively.  Patient received 5 mg of midodrine and was getting 500 cc bolus.  Appears euvolemic.  She was alert and oriented and on BiPAP.  She was placed on BiPAP after extubation from general anesthesia because of her history of COPD and hypercarbia. Blood pressure improved to 99/58.  Chest was clear Patient will be going to stepdown for further monitoring overnight.

## 2022-03-12 NOTE — Progress Notes (Addendum)
Patient was seen evaluated and examined by me and the PA on 03/12/22.  Course of action, evaluation, and management decisions were developed solely by me, but detailed below in the PA's note.  [facility] NOTE       Patient ID: Meghan [name] MRN: [id number] DOB/AGE: 1946/07/26 76 y.o.  Admit date: 03/11/2022 Referring Physician Dr. Francine Graven  Primary Physician  Primary Cardiologist Dr. Clayborn Bigness Reason for Consultation medical optimization prior to orthopedic surgery  HPI: Meghan [name] is a 68yoF with a PMH of complete heart block s/p Dual Chamber PPM 10/2018, HFpEF (LVEF >55%, g2dd), mild to mod MR (by echo 03/11/22), COPD, type 2 diabetes, hypertension, morbid obesity who presented to [facility] ED the early morning hours of 03/11/2022 after a mechanical fall and was found to have a comminuted fracture of her right ankle.  Cardiology is consulted for medical optimization prior to orthopedic surgery.  Interval History: - awake and alert today, no chest pain, SOB - has pain in her right ankle - weaned off Bipap to 4L by [place] - echo resulted with preserved LVEF, mild to mod MR with annular thickening   Review of systems complete and found to be negative unless listed above     Past Medical History:  Diagnosis Date   Anginal pain (HCC)    Anxiety    Arthritis    RA   Asthma    Brain tumor (benign) (HCC)    CHF (congestive heart failure) (HCC)    Collagen vascular disease (HCC)    Complete heart block (HCC)    COPD (chronic obstructive pulmonary disease) (HCC)    Coronary artery disease    DDD (degenerative disc disease)    Depression    Diabetes mellitus    GERD (gastroesophageal reflux disease)    Headache    Heart murmur    Hypercholesteremia    Hypertension    Lumbar degenerative disc disease    Migraines    Obesity    Osteopenia    Osteoporosis    Pacemaker- [employer]    Pneumonia    PTSD (post-traumatic stress disorder)     Past Surgical History:  Procedure  Laterality Date   ABDOMINAL HYSTERECTOMY     CERVICAL FUSION     CHOLECYSTECTOMY     COLONOSCOPY WITH PROPOFOL N/A 09/20/2015   Procedure: COLONOSCOPY WITH PROPOFOL;  Surgeon: Josefine Class, MD;  Location: [facility] ENDOSCOPY;  Service: Endoscopy;  Laterality: N/A;   COLONOSCOPY WITH PROPOFOL N/A 01/27/2016   Procedure: COLONOSCOPY WITH PROPOFOL;  Surgeon: Manya Silvas, MD;  Location: [facility] ENDOSCOPY;  Service: Endoscopy;  Laterality: N/A;   FINGER ARTHROPLASTY  02/23/2012   Procedure: FINGER ARTHROPLASTY;  Surgeon: Cammie Sickle., MD;  Location: [place];  Service: Orthopedics;  Laterality: Left;  Extensor carpi radialis longus to Extensor carpi ulnaris transfer, left Metaphalangeal reconstructions of index and long fingers,   FOOT ARTHROPLASTY     toes x2 rt foot   HAND RECONSTRUCTION  2011   right-multiple finger joint reconst   JOINT REPLACEMENT     bilat knee replacements   KYPHOPLASTY N/A 06/14/2017   Procedure: KYPHOPLASTY L1;  Surgeon: Hessie Knows, MD;  Location: [facility] ORS;  Service: Orthopedics;  Laterality: N/A;   LOOP RECORDER INSERTION N/A 06/23/2017   Procedure: LOOP RECORDER INSERTION;  Surgeon: Isaias Cowman, MD;  Location: [place] [facility];  Service: Cardiovascular;  Laterality: N/A;   LOOP RECORDER REMOVAL N/A 10/21/2018   Procedure: LOOP  RECORDER REMOVAL;  Surgeon: Deboraha Sprang, MD;  Location: [place] [facility];  Service: Cardiovascular;  Laterality: N/A;   ORIF FEMUR FRACTURE Right 10/22/2018   Procedure: OPEN REDUCTION INTERNAL FIXATION (ORIF) DISTAL FEMUR FRACTURE;  Surgeon: Marchia Bond, MD;  Location: [place];  Service: Orthopedics;  Laterality: Right;   PACEMAKER IMPLANT N/A 10/21/2018   Procedure: PACEMAKER IMPLANT;  Surgeon: Deboraha Sprang, MD;  Location: [place] [facility];  Service: Cardiovascular;  Laterality: N/A;    Medications Prior to Admission  Medication Sig Dispense Refill Last Dose   carboxymethylcellul-glycerin  (REFRESH OPTIVE) 0.5-0.9 % ophthalmic solution Place 1 drop into both eyes at bedtime.   03/09/2022 at 2100   ferrous sulfate 325 (65 FE) MG tablet Take 325 mg by mouth daily with breakfast.   03/09/2022 at 1000   hydrOXYzine (ATARAX) 25 MG tablet Take 25 mg by mouth 3 (three) times daily as needed (pruitus).   prn at prn   Polyethyl Glycol-Propyl Glycol 0.4-0.3 % SOLN Place 1 drop into both eyes at bedtime.   03/09/2022 at 2100   acetaminophen (TYLENOL) 500 MG tablet Take 1 tablet by mouth 3 (three) times daily with meals as needed.   03/09/2022 at 2200   albuterol (VENTOLIN HFA) 108 (90 Base) MCG/ACT inhaler Inhale 1-2 puffs into the lungs every 6 (six) hours as needed for wheezing or shortness of breath.   prn at prn   budesonide (PULMICORT) 0.5 MG/2ML nebulizer solution Inhale 2 mLs into the lungs daily.   03/09/2022 at 1800   busPIRone (BUSPAR) 10 MG tablet Take 10 mg by mouth 2 (two) times daily.   03/09/2022 at 1800   Calcium Carb-Cholecalciferol (CALCIUM 500/D) 500-400 MG-UNIT CHEW Chew by mouth daily.   prn at prn   calcium carbonate (OS-CAL - DOSED IN MG OF ELEMENTAL CALCIUM) 1250 (500 Ca) MG tablet Take 2 tablets by mouth every 2 (two) hours as needed.   03/09/2022 at 1000   Cholecalciferol (VITAMIN D3) 125 MCG (5000 UT) CAPS Take 1 capsule (5,000 Units total) by mouth daily with breakfast. Take along with calcium and magnesium. 30 capsule 2 03/09/2022 at 1000   Cholecalciferol 125 MCG (5000 UT) capsule Take 5,000 Units by mouth daily.   03/09/2022 at 1000   ciclopirox (LOPROX) 0.77 % cream Apply 1 g topically daily.   prn at prn   cyanocobalamin 500 MCG tablet Take 500 mcg by mouth daily. (Patient not taking: Reported on 03/11/2022)   Not Taking   diclofenac sodium (VOLTAREN) 1 % GEL Apply 2 g topically 3 (three) times daily. Apply to the fingers   prn at prn   ergocalciferol (VITAMIN D2) 1.25 MG (50000 UT) capsule Take 1 capsule (50,000 Units total) by mouth 2 (two) times a week. X 6 weeks. 12  capsule 0    escitalopram (LEXAPRO) 20 MG tablet Take 20 mg by mouth daily.   03/09/2022 at 1000   famotidine (PEPCID) 40 MG tablet Take 40 mg by mouth daily.   03/09/2022 at 1000   fluticasone (VERAMYST) 27.5 MCG/SPRAY nasal spray Place 2 sprays into the nose 2 (two) times daily. (Patient not taking: Reported on 03/11/2022)   Not Taking   folic acid (FOLVITE) 1 MG tablet Take 1 mg by mouth daily. (Patient not taking: Reported on 03/11/2022)   Not Taking   furosemide (LASIX) 40 MG tablet Take 40 mg by mouth 2 (two) times daily.   03/09/2022 at 1800   lamoTRIgine (LAMICTAL) 100 MG tablet Take  1 tablet (100 mg total) by mouth daily. 90 tablet 3 03/09/2022 at 1000   lisinopril (ZESTRIL) 2.5 MG tablet Take 1 tablet by mouth daily. (Patient not taking: Reported on 03/11/2022)   Not Taking   [facility] REFILL KIT 25 MCG/ML SOLN SMARTSIG:1 Vial(s) Via Nebulizer Every 12 Hours PRN (Patient not taking: Reported on 03/11/2022)   Not Taking   loperamide (IMODIUM A-D) 2 MG tablet Take 1 tablet (2 mg total) by mouth 4 (four) times daily as needed for diarrhea or loose stools. (Patient taking differently: Take 2 mg by mouth as needed for diarrhea or loose stools.) 12 tablet 0 prn at prn   loratadine (CLARITIN) 10 MG tablet Take 10 mg by mouth daily.   03/09/2022 at 1000   LYRICA 100 MG capsule Take 100 mg by mouth 2 (two) times daily.   03/09/2022 at 1800   methotrexate (RHEUMATREX) 2.5 MG tablet 1 tablet daily. (Patient not taking: Reported on 03/11/2022)   Not Taking   mirtazapine (REMERON) 15 MG tablet Take 15 mg by mouth at bedtime.   03/09/2022 at 2000   mirtazapine (REMERON) 7.5 MG tablet Take 1 tablet by mouth at bedtime. (Patient not taking: Reported on 03/11/2022)   Not Taking   [facility] powder Apply 1 Application topically 2 (two) times daily.   03/09/2022 at 1700   omeprazole (PRILOSEC) 20 MG capsule Take 20 mg by mouth daily.  (Patient not taking: Reported on 03/11/2022)   Not Taking   oxycodone (OXY-IR) 5 MG capsule  Take 5 mg by mouth every 4 (four) hours as needed.   03/09/2022 at 2123   Potassium Chloride ER 20 MEQ TBCR Take 20 mEq by mouth 2 (two) times daily. 30 tablet 0 03/09/2022 at 1800   pramipexole (MIRAPEX) 0.25 MG tablet Take 0.25 mg by mouth daily.    03/09/2022 at 1000   pravastatin (PRAVACHOL) 20 MG tablet Take 1 tablet by mouth 3 (three) times a week. Monday, Wednesday, and Friday   03/09/2022 at 2000   promethazine (PHENERGAN) 25 MG tablet Take 1 tablet by mouth daily.   03/09/2022 at 2123   Psyllium (METAMUCIL) WAFR Take 2 g by mouth daily as needed.   prn at prn   saxagliptin HCl (ONGLYZA) 5 MG TABS tablet Take 1 tablet by mouth daily. (Patient not taking: Reported on 03/11/2022)   Not Taking   Tofacitinib Citrate (XELJANZ) 5 MG TABS Take 1 tablet by mouth 2 (two) times daily.   03/09/2022 at 1800   topiramate (TOPAMAX) 25 MG tablet Take 25 mg by mouth at bedtime.    03/09/2022 at 2000   traZODone (DESYREL) 100 MG tablet Take 1 tablet by mouth daily.   03/09/2022 at 2100    Social History   Socioeconomic History   Marital status: Divorced    Spouse name: Not on file   Number of children: Not on file   Years of education: Not on file   Highest education level: Not on file  Occupational History   Occupation: retired  Tobacco Use   Smoking status: Never   Smokeless tobacco: Never  Vaping Use   Vaping Use: Never used  Substance and Sexual Activity   Alcohol use: No    Alcohol/week: 0.0 standard drinks of alcohol   Drug use: No   Sexual activity: Never  Other Topics Concern   Not on file  Social History Narrative   Not on file   Social Determinants of Health   Financial Resource Strain:  Not on file  Food Insecurity: Not on file  Transportation Needs: Not on file  Physical Activity: Not on file  Stress: Not on file  Social Connections: Not on file  Intimate Partner Violence: Not on file    Family History  Problem Relation Age of Onset   Depression Sister    Migraines Sister     Hypertension Mother    Arthritis/Rheumatoid Mother    Heart attack Father    Hypertension Father    CAD Other    Hypertension Other    Diabetes Mellitus II Other    Arthritis Other      PHYSICAL EXAM General: Elderly and obese Caucasian female, well nourished, laying nearly flat in PCU bed HEENT:  Normocephalic and atraumatic. Neck:  No JVD.  Lungs: normal respiratory effort on 4L by [place]. Trace R crackles  Heart: Tachycardic but regular, paced rhythm. Normal S1 and S2 with a 4/6 systolic murmur present throughout. Radial & DP pulses 2+ bilaterally. Abdomen: Obeseappearing.  Msk: Normal strength and tone for age. Extremities: Left lower extremity without appreciable edema, right lower extremity immobilized with Ace bandage in place  neuro: Alert and oriented X 3. Psych:  Answers questions appropriately.   Labs:   Lab Results  Component Value Date   WBC 3.2 (L) 03/12/2022   HGB 11.1 (L) 03/12/2022   HCT 33.1 (L) 03/12/2022   MCV 95.4 03/12/2022   PLT 105 (L) 03/12/2022    Recent Labs  Lab 03/11/22 0509 03/12/22 0439  NA 145 141  K 3.0* 3.0*  CL 110 104  CO2 27 28  BUN 13 13  CREATININE 0.81 0.82  CALCIUM 8.0* 8.3*  PROT 5.7*  --   BILITOT 0.8  --   ALKPHOS 42  --   ALT 14  --   AST 18  --   GLUCOSE 97 73    Lab Results  Component Value Date   CKTOTAL 186 06/10/2017   CKMB 2.6 11/29/2012   TROPONINI <0.03 10/21/2018    Lab Results  Component Value Date   CHOL 148 08/01/2012   Lab Results  Component Value Date   HDL 47 08/01/2012   Lab Results  Component Value Date   LDLCALC 80 08/01/2012   Lab Results  Component Value Date   TRIG 103 08/01/2012   No results found for: "CHOLHDL" No results found for: "LDLDIRECT"    Radiology: ECHOCARDIOGRAM COMPLETE  Result Date: 03/11/2022    ECHOCARDIOGRAM REPORT   Patient Name:   Meghan [name] [facility] Date of Exam: 03/11/2022 Medical Rec #:  [id number]    Height:       55.0 in Accession #:    [id number]   Weight:        144.6 lb Date of Birth:  Dec 14, 1945    BSA:          1.527 m Patient Age:    25 years     BP:           97/70 mmHg Patient Gender: F            HR:           104 bpm. Exam Location:  [facility] Procedure: 2D Echo, Color Doppler and Cardiac Doppler Indications:     I50.31 congestive heart failure-Acute Diastolic  History:         Patient has prior history of Echocardiogram examinations, most  recent 10/21/2018. CHF, Pacemaker, COPD; Risk                  Factors:Hypertension, Diabetes and HCL.  Sonographer:     Charmayne Sheer Referring Phys:  [id number] [place] TANG Diagnosing Phys: Yolonda Kida MD  Sonographer Comments: Suboptimal apical window. Image acquisition challenging due to COPD, Image acquisition challenging due to respiratory motion and Image acquisition challenging due to patient body habitus. IMPRESSIONS  1. Left ventricular ejection fraction, by estimation, is 55 to 60%. The left ventricle has normal function. The left ventricle has no regional wall motion abnormalities. Left ventricular diastolic parameters are consistent with Grade I diastolic dysfunction (impaired relaxation).  2. Right ventricular systolic function is normal. The right ventricular size is normal.  3. Mitral Vavle annulous thickening/Possible Repair. The mitral valve is degenerative. Mild to moderate mitral valve regurgitation.  4. The aortic valve is normal in structure. Aortic valve regurgitation is not visualized. Aortic valve sclerosis/calcification is present, without any evidence of aortic stenosis. FINDINGS  Left Ventricle: Left ventricular ejection fraction, by estimation, is 55 to 60%. The left ventricle has normal function. The left ventricle has no regional wall motion abnormalities. The left ventricular internal cavity size was normal in size. There is  borderline left ventricular hypertrophy. Left ventricular diastolic parameters are consistent with Grade I diastolic dysfunction (impaired relaxation). Right  Ventricle: The right ventricular size is normal. No increase in right ventricular wall thickness. Right ventricular systolic function is normal. Left Atrium: Left atrial size was normal in size. Right Atrium: Right atrial size was normal in size. Pericardium: There is no evidence of pericardial effusion. Mitral Valve: Mitral Vavle annulous thickening/Possible Repair. The mitral valve is degenerative in appearance. Mild to moderate mitral valve regurgitation. Tricuspid Valve: The tricuspid valve is normal in structure. Tricuspid valve regurgitation is mild. Aortic Valve: The aortic valve is normal in structure. Aortic valve regurgitation is not visualized. Aortic valve sclerosis/calcification is present, without any evidence of aortic stenosis. Aortic valve mean gradient measures 4.0 mmHg. Aortic valve peak  gradient measures 8.8 mmHg. Aortic valve area, by VTI measures 1.33 cm. Pulmonic Valve: The pulmonic valve was grossly normal. Pulmonic valve regurgitation is not visualized. Aorta: The ascending aorta was not well visualized. IAS/Shunts: No atrial level shunt detected by color flow Doppler.  LEFT VENTRICLE PLAX 2D LVIDd:         4.72 cm   Diastology LVIDs:         3.27 cm   LV e' lateral:   5.22 cm/s LV PW:         1.23 cm   LV E/e' lateral: 35.9 LV IVS:        0.92 cm LVOT diam:     1.60 cm LV SV:         29 LV SV Index:   19 LVOT Area:     2.01 cm  RIGHT VENTRICLE RV Basal diam:  2.94 cm LEFT ATRIUM             Index        RIGHT ATRIUM          Index LA diam:        3.80 cm 2.49 cm/m   RA Area:     6.96 cm LA Vol (A2C):   53.8 ml 35.24 ml/m  RA Volume:   10.90 ml 7.14 ml/m LA Vol (A4C):   56.5 ml 37.00 ml/m LA Biplane Vol: 56.2 ml 36.81 ml/m  AORTIC  VALVE                    PULMONIC VALVE AV Area (Vmax):    1.31 cm     PV Vmax:       0.99 m/s AV Area (Vmean):   1.22 cm     PV Peak grad:  3.9 mmHg AV Area (VTI):     1.33 cm AV Vmax:           148.00 cm/s AV Vmean:          95.300 cm/s AV VTI:             0.217 m AV Peak Grad:      8.8 mmHg AV Mean Grad:      4.0 mmHg LVOT Vmax:         96.10 cm/s LVOT Vmean:        57.900 cm/s LVOT VTI:          0.144 m LVOT/AV VTI ratio: 0.66  AORTA Ao Root diam: 3.20 cm MITRAL VALVE                TRICUSPID VALVE MV Area (PHT): 3.25 cm     TR Peak grad:   40.7 mmHg MV Decel Time: 234 msec     TR Vmax:        319.00 cm/s MV E velocity: 187.50 cm/s                             SHUNTS                             Systemic VTI:  0.14 m                             Systemic Diam: 1.60 cm Yolonda Kida MD Electronically signed by Yolonda Kida MD Signature Date/Time: 03/11/2022/4:38:20 PM    Final    CT Head Wo Contrast  Result Date: 03/11/2022 CLINICAL DATA:  Head trauma, minor (Age >= 65y); Neck trauma (Age >= 65y) EXAM: CT HEAD WITHOUT CONTRAST CT CERVICAL SPINE WITHOUT CONTRAST TECHNIQUE: Multidetector CT imaging of the head and cervical spine was performed following the standard protocol without intravenous contrast. Multiplanar CT image reconstructions of the cervical spine were also generated. RADIATION DOSE REDUCTION: This exam was performed according to the departmental dose-optimization program which includes automated exposure control, adjustment of the mA and/or kV according to patient size and/or use of iterative reconstruction technique. COMPARISON:  CT head and CT cervical spine October 16, 2020. 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 No evidence of acute large vascular territory infarct, mass lesion, midline shift, or acute hemorrhage. Vascular: No hyperdense vessel identified. Skull: Right posterior vertex scalp contusion without acute calvarial fracture. Sinuses/Orbits: Mild paranasal sinus mucosal thickening. No acute orbital findings. Other: No mastoid effusions. CT CERVICAL SPINE FINDINGS Alignment: Similar anterolisthesis of C4 on C5, C7 on T1 and T1 on T2. No new sagittal subluxation.  Skull base and vertebrae: Vertebral body heights are maintained. No evidence of acute fracture. Soft tissues and spinal canal: No prevertebral fluid or swelling. No visible canal hematoma. Disc levels: Similar multilevel degenerative disc disease and multilevel facet/uncovertebral hypertrophy with varying degrees of neural foraminal stenosis. Upper chest: Visualized lung apices are clear. IMPRESSION: 1. No  evidence of acute intracranial abnormality. 2. Right posterior vertex scalp contusion without acute calvarial fracture. 3. No evidence of acute fracture or traumatic malalignment in the cervical spine. Electronically Signed   By: Margaretha Sheffield M.D.   On: 03/11/2022 08:27   CT Cervical Spine Wo Contrast  Result Date: 03/11/2022 CLINICAL DATA:  Head trauma, minor (Age >= 65y); Neck trauma (Age >= 65y) EXAM: CT HEAD WITHOUT CONTRAST CT CERVICAL SPINE WITHOUT CONTRAST TECHNIQUE: Multidetector CT imaging of the head and cervical spine was performed following the standard protocol without intravenous contrast. Multiplanar CT image reconstructions of the cervical spine were also generated. RADIATION DOSE REDUCTION: This exam was performed according to the departmental dose-optimization program which includes automated exposure control, adjustment of the mA and/or kV according to patient size and/or use of iterative reconstruction technique. COMPARISON:  CT head and CT cervical spine October 16, 2020. 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 No evidence of acute large vascular territory infarct, mass lesion, midline shift, or acute hemorrhage. Vascular: No hyperdense vessel identified. Skull: Right posterior vertex scalp contusion without acute calvarial fracture. Sinuses/Orbits: Mild paranasal sinus mucosal thickening. No acute orbital findings. Other: No mastoid effusions. CT CERVICAL SPINE FINDINGS Alignment: Similar anterolisthesis of C4 on C5, C7  on T1 and T1 on T2. No new sagittal subluxation. Skull base and vertebrae: Vertebral body heights are maintained. No evidence of acute fracture. Soft tissues and spinal canal: No prevertebral fluid or swelling. No visible canal hematoma. Disc levels: Similar multilevel degenerative disc disease and multilevel facet/uncovertebral hypertrophy with varying degrees of neural foraminal stenosis. Upper chest: Visualized lung apices are clear. IMPRESSION: 1. No evidence of acute intracranial abnormality. 2. Right posterior vertex scalp contusion without acute calvarial fracture. 3. No evidence of acute fracture or traumatic malalignment in the cervical spine. Electronically Signed   By: Margaretha Sheffield M.D.   On: 03/11/2022 08:27   DG Chest Portable 1 View  Result Date: 03/11/2022 CLINICAL DATA:  Provided history: Status post reduction. Shortness of breath. EXAM: PORTABLE CHEST 1 VIEW COMPARISON:  Chest CT 10/02/2020. Prior chest radiographs 10/22/2018 and earlier. FINDINGS: Left chest multi lead implantable cardiac device. Cardiomegaly. Aortic atherosclerosis. Central pulmonary vascular congestion with suspected mild interstitial edema. Chronic elevation of the right hemidiaphragm. Small right pleural effusion. Additional opacity at the right lung base which may reflect atelectasis and/or consolidation. No evidence of pneumothorax. No acute bony abnormality identified. IMPRESSION: Cardiomegaly with central pulmonary vascular congestion and suspected mild interstitial edema. Chronic elevation of the right hemidiaphragm. Small right pleural effusion. Underlying atelectasis and/or consolidation at the right lung base. Aortic Atherosclerosis (ICD10-I70.0). Electronically Signed   By: Kellie Simmering D.O.   On: 03/11/2022 08:15   DG Ankle 2 Views Right  Result Date: 03/11/2022 CLINICAL DATA:  Status post reduction. EXAM: RIGHT ANKLE - 2 VIEW COMPARISON:  March 11, 2022 FINDINGS: Redemonstrated is comminuted impacted  fracture of the distal right tibia. Persistent lateral displacement of the main distal fracture fragment and approximately 1.3 cm impaction. Butterfly fragment is seen posterior to the oblique fracture line. Similar comminuted fracture of the distal right fibula with persistent impaction and angulation. Associated soft tissue swelling. Cast material overlies the soft tissues and obscures details. IMPRESSION: Persistent comminuted impacted fractures of the distal right tibia and fibula. Persistent lateral displacement of the main distal fracture fragment of the tibia and approximately 1.3 cm impaction. Electronically Signed   By: Fidela Salisbury M.D.   On:  03/11/2022 08:15   DG Hip Unilat W or Wo Pelvis 2-3 Views Right  Result Date: 03/11/2022 CLINICAL DATA:  Status post fall. EXAM: DG HIP (WITH OR WITHOUT PELVIS) 2-3V RIGHT COMPARISON:  05/24/21 FINDINGS: The bones appear osteopenic. Degenerative changes noted within the imaged portions of the lumbar spine. Signs of previous IM nail fixation of the right femur. No signs of acute fracture or dislocation. Mild bilateral and symmetric osteoarthritis noted within the hips. IMPRESSION: 1. No acute findings. 2. 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 Electronically Signed   By: Kerby Moors M.D.   On: 03/11/2022 05:42   DG Ankle Complete Right  Result Date: 03/11/2022 CLINICAL DATA:  Status post fall. EXAM: RIGHT ANKLE - COMPLETE 3+ VIEW COMPARISON:  None Available. FINDINGS: The bones appear osteopenic. There is diffuse soft tissue swelling. Vascular calcifications are identified there are acute, comminuted fracture deformities involving the distal diaphysis of the fibula and tibia. Lateral angulation of the distal fracture fragments identified. Mild lateral and dorsal displacement of the distal fracture fragments also noted. IMPRESSION: 1. Acute, comminuted fracture deformities involve the distal diaphysis of the fibula and tibia. Lateral  angulation of the distal fracture fragments. 2. Soft tissue swelling. Electronically Signed   By: Kerby Moors M.D.   On: 03/11/2022 05:41    ECHO 12/30/2021 Jobe Gibbon, MD - 12/22/2021  Formatting of this note might be different from the original.                         [place], [place]                                    [id number]            [place] #: [id number]            [address] [place], [place] [address]       Date: 12/22/2021 02: 28 PM                                                               Adult   Female   Age: 51 yrs            ECHOCARDIOGRAM REPORT                              Outpatient                                                               [facility]       STUDY:CHEST WALL  TAPE:0000: 00: 0: 00: 00 MD1: Marvelous Bouwens, MD        ECHO:Yes    DOPPLER:Yes       FILE:[id number]        BP: 90/60 mmHg       COLOR:Yes   CONTRAST:No     MACHINE:Philips   RV BIOPSY:No          3D:No  SOUND QLTY:Moderate            Height: 55 in      MEDIUM:None                                              Weight: 147 lb                                                               BSA: 1.5 m2  _________________________________________________________________________________________                HISTORY: DOE, Murmur                 REASON: Assess, LV function, Assess, Source of Murmur             INDICATION: R01.1 Cardiac murmur, unspecified  _________________________________________________________________________________________  ECHOCARDIOGRAPHIC MEASUREMENTS  2D DIMENSIONS  AORTA                  Values   Normal Range   MAIN PA         Values    Normal Range                Annulus: nm*          [2.1-2.5]         PA Main: nm*       [1.5-2.1]              Aorta Sin: 3.1 cm       [2.7-3.3]    RIGHT VENTRICLE            ST Junction: nm*           [2.3-2.9]         RV Base: 3.7 cm    [<4.2]              Asc.Aorta: nm*          [2.3-3.1]          RV Mid: nm*       [<3.5]  LEFT VENTRICLE                                      RV Length: nm*       [<8.6]                  LVIDd: 5.7 cm       [3.9-5.3]    INFERIOR VENA CAVA                  LVIDs: 3.7 cm                        Max.  IVC: nm*       [<=2.1]                     FS: 34.8 %       [>25]            Min. IVC: nm*                    SWT: 0.78 cm      [0.5-0.9]    ------------------                    PWT: 0.77 cm      [0.5-0.9]    nm* - not measured  LEFT ATRIUM                LA Diam: 5.5 cm       [2.7-3.8]            LA A4C Area: nm*          [<20]              LA Volume: nm*          [22-52]  _________________________________________________________________________________________  ECHOCARDIOGRAPHIC DESCRIPTIONS  AORTIC ROOT                   Size: Normal             Dissection: INDETERM FOR DISSECTION  AORTIC VALVE               Leaflets: Tricuspid                   Morphology: MILDLY THICKENED               Mobility: Fully mobile  LEFT VENTRICLE                   Size: Normal                        Anterior: Normal            Contraction: Normal                         Lateral: Normal             Closest EF: >55% (Estimated)                Septal: Normal              LV Masses: No Masses                       Apical: Normal                    LVH: None                          Inferior: Normal                                                      Posterior: Normal           Dias.FxClass: (Grade 2) relaxation abnormal, pseudonormal  MITRAL VALVE               Leaflets: Normal  Mobility: PARTIALLY MOBILE             Morphology: ANNULAR CALC  LEFT ATRIUM                   Size: MODERATELY ENLARGED          LA Masses: No masses              IA Septum: Normal IAS  MAIN PA                   Size: Normal  PULMONIC VALVE             Morphology: Normal                         Mobility: Fully mobile  RIGHT VENTRICLE              RV Masses: No Masses                         Size: Normal              Free Wall: Normal                     Contraction: Normal  TRICUSPID VALVE               Leaflets: Normal                        Mobility: Fully mobile             Morphology: Normal  RIGHT ATRIUM                   Size: Normal                        RA Other: None                RA Mass: No masses  PERICARDIUM                  Fluid: No effusion  INFERIOR VENACAVA                   Size: Normal Normal respiratory collapse  _________________________________________________________________________________________   DOPPLER ECHO and OTHER SPECIAL PROCEDURES                 Aortic: TRIVIAL AR                 No AS                         154.6 cm/sec peak vel      9.6 mmHg peak grad                         5.0 mmHg mean grad         3.1 cm^2 by DOPPLER                 Mitral: SEVERE MR                  No MS                         MV Inflow E Vel = 149.0 cm/sec      MV Annulus E'Vel =  5.3 cm/sec                         E/E'Ratio = 28.0              Tricuspid: MODERATE TR                No TS                         359.7 cm/sec peak TR vel   54.8 mmHg peak RV pressure              Pulmonary: TRIVIAL PR                 No PS  _________________________________________________________________________________________  INTERPRETATION  NORMAL LEFT VENTRICULAR SYSTOLIC FUNCTION WITH AN ESTIMATED EF = >55 %  NORMAL RIGHT VENTRICULAR SYSTOLIC FUNCTION  SEVERE VALVULAR REGURGITATION (See above)  NO VALVULAR STENOSIS  BRIGHT, ECHOGENIC MASS ATTACHED TO THE ANTERIOR MITRAL VALVE LEAFLET (0.2 CM x 0.66 CM)  IS NOTED (SEE IMAGES 13-17, 63, 77 & 78)   TELEMETRY reviewed by me: Paced rhythm low 108  EKG reviewed by me: None available for review  ASSESSMENT AND PLAN:  Meghan [name] is a 79yoF with a PMH of complete heart block s/p Dual Chamber 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  Cardiology is consulted for medical optimization prior to orthopedic surgery.  #Comminuted right ankle fracture #Acute hypoxic and hypercarbic respiratory failure #Chronic HFpEF, mi-mod MR The patient presents after a mechanical fall and was initially not complaining of shortness of breath and was stable on room air.  She received significant amounts of pain medication this morning, became lethargic and minimally responsive, requiring a liter of fluids, Narcan, and BiPAP for respiratory support to which she responded well.  Her BNP is slightly elevated in the 400s but does not appear grossly volume overloaded on exam.  She has known severe MR by echocardiogram in May of this year managed medically -Agree with IV Lasix 40 mg x 2 doses, dose another 11m IV this morning -Monitor and replete electrolytes -Continue other GDMT with her home lisinopril 2.5 mg once daily and restart home Lasix 40 mg once daily likely tomorrow.  consider addition of beta-blocker as her blood pressure allows. -Echocardiogram complete resulted with preserved LVEF and mild to moderate MR with annular thickening, similar to prior study  -After diuresis as above, she is okay to proceed with orthopedic surgery without further diagnostic testing with a mild to moderate risk from a cardiac standpoint.  This patient's plan of care was discussed and created with Dr. CClayborn Bignessand he is in agreement.  Signed: LTristan Schroeder, PA-C 03/12/2022, 8:59 AM [facility]

## 2022-03-12 NOTE — Progress Notes (Signed)
PROGRESS NOTE    Meghan [name]  XNA:[id number] DOB: 26-Aug-1945 DOA: 03/11/2022 PCP: Garwin Brothers, MD    Brief Narrative:  Meghan [name] is a 76 y.o. female with medical history significant for rheumatoid arthritis, COPD, coronary artery disease, hypertension, dyslipidemia, status post pacemaker insertion for complete heart block who presented to the ER via EMS for evaluation following a mechanical fall. Most of the history was obtained from the chart as patient is very lethargic and unable to provide any history. Patient was said to have gotten up to use the bathroom and fell.  There was no loss of consciousness and she denied feeling dizzy or lightheaded prior to the fall.  She noted severe pain in her right ankle as well as right hip following the fall and was found to have a hematoma. She was brought into the ER and was noted to have severe deformity involving her right ankle but was able to move her toes. Patient had received IV fentanyl for pain control by EMS, she received IV morphine in the ER and was said to have taken some of her oxycodone at home for pain control. Patient's ankle fracture was reduced in the ER and orthopedic surgery was consulted for further evaluation. During my evaluation she is noted to be very lethargic but arouses to verbal stimuli and is unable to stay awake to provide any history.   8/3 awake, responding appropriately. Denies sob or cp    Consultants:  Cardiology, orthopedics, PCCM  Procedures:   Antimicrobials:      Subjective: No nausea or vomiting  Objective: Vitals:   03/12/22 0810 03/12/22 0838 03/12/22 0840 03/12/22 1154  BP:  95/61 (!) 98/50 105/60  Pulse:  100  87  Resp:  20 (!) 25 20  Temp:  97.9 F (36.6 C)  98.9 F (37.2 C)  TempSrc:      SpO2: 92% 97%  100%    Intake/Output Summary (Last 24 hours) at 03/12/2022 1306 Last data filed at 03/11/2022 2221 Gross per 24 hour  Intake 167.9 ml  Output 650 ml  Net -482.1 ml   There were  no vitals filed for this visit.  Examination: Calm, NAD Minimal scattered very fine crackles at base  Reg s1/s2 no gallop Soft benign +bs Trace edema b/l Aaoxox3  Mood and affect appropriate in current setting     Data Reviewed: I have personally reviewed following labs and imaging studies  CBC: Recent Labs  Lab 03/11/22 0509 03/12/22 0439  WBC 3.4* 3.2*  NEUTROABS 2.3  --   HGB 11.6* 11.1*  HCT 35.0* 33.1*  MCV 95.9 95.4  PLT 126* 542*   Basic Metabolic Panel: Recent Labs  Lab 03/11/22 0509 03/11/22 1246 03/12/22 0439  NA 145  --  141  K 3.0*  --  3.0*  CL 110  --  104  CO2 27  --  28  GLUCOSE 97  --  73  BUN 13  --  13  CREATININE 0.81  --  0.82  CALCIUM 8.0*  --  8.3*  MG 1.9 2.0  --   PHOS 3.5  --   --    GFR: CrCl cannot be calculated (Unknown ideal weight.). Liver Function Tests: Recent Labs  Lab 03/11/22 0509  AST 18  ALT 14  ALKPHOS 42  BILITOT 0.8  PROT 5.7*  ALBUMIN 3.4*   No results for input(s): "LIPASE", "AMYLASE" in the last 168 hours. No results for input(s): "AMMONIA" in the last 168  hours. Coagulation Profile: Recent Labs  Lab 03/11/22 0509  INR 1.1   Cardiac Enzymes: No results for input(s): "CKTOTAL", "CKMB", "CKMBINDEX", "TROPONINI" in the last 168 hours. BNP (last 3 results) No results for input(s): "PROBNP" in the last 8760 hours. HbA1C: Recent Labs    03/11/22 0509  HGBA1C 4.6*   CBG: Recent Labs  Lab 03/11/22 2351 03/12/22 0542 03/12/22 0544 03/12/22 0831 03/12/22 1152  GLUCAP 90 69* 79 71 104*   Lipid Profile: No results for input(s): "CHOL", "HDL", "LDLCALC", "TRIG", "CHOLHDL", "LDLDIRECT" in the last 72 hours. Thyroid Function Tests: Recent Labs    03/11/22 0509  TSH 1.670   Anemia Panel: No results for input(s): "VITAMINB12", "FOLATE", "FERRITIN", "TIBC", "IRON", "RETICCTPCT" in the last 72 hours. Sepsis Labs: Recent Labs  Lab 03/11/22 0509  PROCALCITON <0.10    No results found for this  or any previous visit (from the past 240 hour(s)).       Radiology Studies: ECHOCARDIOGRAM COMPLETE  Result Date: 03/11/2022    ECHOCARDIOGRAM REPORT   Patient Name:   Meghan [name] [facility] Date of Exam: 03/11/2022 Medical Rec #:  [id number]    Height:       55.0 in Accession #:    [id number]   Weight:       144.6 lb Date of Birth:  02/04/1946    BSA:          1.527 m Patient Age:    9 years     BP:           97/70 mmHg Patient Gender: F            HR:           104 bpm. Exam Location:  [facility] Procedure: 2D Echo, Color Doppler and Cardiac Doppler Indications:     I50.31 congestive heart failure-Acute Diastolic  History:         Patient has prior history of Echocardiogram examinations, most                  recent 10/21/2018. CHF, Pacemaker, COPD; Risk                  Factors:Hypertension, Diabetes and HCL.  Sonographer:     Charmayne Sheer Referring Phys:  [id number] [place] TANG Diagnosing Phys: Yolonda Kida MD  Sonographer Comments: Suboptimal apical window. Image acquisition challenging due to COPD, Image acquisition challenging due to respiratory motion and Image acquisition challenging due to patient body habitus. IMPRESSIONS  1. Left ventricular ejection fraction, by estimation, is 55 to 60%. The left ventricle has normal function. The left ventricle has no regional wall motion abnormalities. Left ventricular diastolic parameters are consistent with Grade I diastolic dysfunction (impaired relaxation).  2. Right ventricular systolic function is normal. The right ventricular size is normal.  3. Mitral Vavle annulous thickening/Possible Repair. The mitral valve is degenerative. Mild to moderate mitral valve regurgitation.  4. The aortic valve is normal in structure. Aortic valve regurgitation is not visualized. Aortic valve sclerosis/calcification is present, without any evidence of aortic stenosis. FINDINGS  Left Ventricle: Left ventricular ejection fraction, by estimation, is 55 to 60%. The left ventricle has  normal function. The left ventricle has no regional wall motion abnormalities. The left ventricular internal cavity size was normal in size. There is  borderline left ventricular hypertrophy. Left ventricular diastolic parameters are consistent with Grade I diastolic dysfunction (impaired relaxation). Right Ventricle: The right ventricular size is normal. No increase in right ventricular  wall thickness. Right ventricular systolic function is normal. Left Atrium: Left atrial size was normal in size. Right Atrium: Right atrial size was normal in size. Pericardium: There is no evidence of pericardial effusion. Mitral Valve: Mitral Vavle annulous thickening/Possible Repair. The mitral valve is degenerative in appearance. Mild to moderate mitral valve regurgitation. Tricuspid Valve: The tricuspid valve is normal in structure. Tricuspid valve regurgitation is mild. Aortic Valve: The aortic valve is normal in structure. Aortic valve regurgitation is not visualized. Aortic valve sclerosis/calcification is present, without any evidence of aortic stenosis. Aortic valve mean gradient measures 4.0 mmHg. Aortic valve peak  gradient measures 8.8 mmHg. Aortic valve area, by VTI measures 1.33 cm. Pulmonic Valve: The pulmonic valve was grossly normal. Pulmonic valve regurgitation is not visualized. Aorta: The ascending aorta was not well visualized. IAS/Shunts: No atrial level shunt detected by color flow Doppler.  LEFT VENTRICLE PLAX 2D LVIDd:         4.72 cm   Diastology LVIDs:         3.27 cm   LV e' lateral:   5.22 cm/s LV PW:         1.23 cm   LV E/e' lateral: 35.9 LV IVS:        0.92 cm LVOT diam:     1.60 cm LV SV:         29 LV SV Index:   19 LVOT Area:     2.01 cm  RIGHT VENTRICLE RV Basal diam:  2.94 cm LEFT ATRIUM             Index        RIGHT ATRIUM          Index LA diam:        3.80 cm 2.49 cm/m   RA Area:     6.96 cm LA Vol (A2C):   53.8 ml 35.24 ml/m  RA Volume:   10.90 ml 7.14 ml/m LA Vol (A4C):   56.5 ml  37.00 ml/m LA Biplane Vol: 56.2 ml 36.81 ml/m  AORTIC VALVE                    PULMONIC VALVE AV Area (Vmax):    1.31 cm     PV Vmax:       0.99 m/s AV Area (Vmean):   1.22 cm     PV Peak grad:  3.9 mmHg AV Area (VTI):     1.33 cm AV Vmax:           148.00 cm/s AV Vmean:          95.300 cm/s AV VTI:            0.217 m AV Peak Grad:      8.8 mmHg AV Mean Grad:      4.0 mmHg LVOT Vmax:         96.10 cm/s LVOT Vmean:        57.900 cm/s LVOT VTI:          0.144 m LVOT/AV VTI ratio: 0.66  AORTA Ao Root diam: 3.20 cm MITRAL VALVE                TRICUSPID VALVE MV Area (PHT): 3.25 cm     TR Peak grad:   40.7 mmHg MV Decel Time: 234 msec     TR Vmax:        319.00 cm/s MV E velocity: 187.50 cm/s  SHUNTS                             Systemic VTI:  0.14 m                             Systemic Diam: 1.60 cm Yolonda Kida MD Electronically signed by Yolonda Kida MD Signature Date/Time: 03/11/2022/4:38:20 PM    Final    CT Head Wo Contrast  Result Date: 03/11/2022 CLINICAL DATA:  Head trauma, minor (Age >= 65y); Neck trauma (Age >= 65y) EXAM: CT HEAD WITHOUT CONTRAST CT CERVICAL SPINE WITHOUT CONTRAST TECHNIQUE: Multidetector CT imaging of the head and cervical spine was performed following the standard protocol without intravenous contrast. Multiplanar CT image reconstructions of the cervical spine were also generated. RADIATION DOSE REDUCTION: This exam was performed according to the departmental dose-optimization program which includes automated exposure control, adjustment of the mA and/or kV according to patient size and/or use of iterative reconstruction technique. COMPARISON:  CT head and CT cervical spine October 16, 2020. 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 No evidence of acute large vascular territory infarct, mass lesion, midline shift, or acute hemorrhage. Vascular: No hyperdense vessel identified. Skull:  Right posterior vertex scalp contusion without acute calvarial fracture. Sinuses/Orbits: Mild paranasal sinus mucosal thickening. No acute orbital findings. Other: No mastoid effusions. CT CERVICAL SPINE FINDINGS Alignment: Similar anterolisthesis of C4 on C5, C7 on T1 and T1 on T2. No new sagittal subluxation. Skull base and vertebrae: Vertebral body heights are maintained. No evidence of acute fracture. Soft tissues and spinal canal: No prevertebral fluid or swelling. No visible canal hematoma. Disc levels: Similar multilevel degenerative disc disease and multilevel facet/uncovertebral hypertrophy with varying degrees of neural foraminal stenosis. Upper chest: Visualized lung apices are clear. IMPRESSION: 1. No evidence of acute intracranial abnormality. 2. Right posterior vertex scalp contusion without acute calvarial fracture. 3. No evidence of acute fracture or traumatic malalignment in the cervical spine. Electronically Signed   By: Margaretha Sheffield M.D.   On: 03/11/2022 08:27   CT Cervical Spine Wo Contrast  Result Date: 03/11/2022 CLINICAL DATA:  Head trauma, minor (Age >= 65y); Neck trauma (Age >= 65y) EXAM: CT HEAD WITHOUT CONTRAST CT CERVICAL SPINE WITHOUT CONTRAST TECHNIQUE: Multidetector CT imaging of the head and cervical spine was performed following the standard protocol without intravenous contrast. Multiplanar CT image reconstructions of the cervical spine were also generated. RADIATION DOSE REDUCTION: This exam was performed according to the departmental dose-optimization program which includes automated exposure control, adjustment of the mA and/or kV according to patient size and/or use of iterative reconstruction technique. COMPARISON:  CT head and CT cervical spine October 16, 2020. 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 No evidence of acute large vascular territory infarct, mass lesion, midline shift, or acute hemorrhage.  Vascular: No hyperdense vessel identified. Skull: Right posterior vertex scalp contusion without acute calvarial fracture. Sinuses/Orbits: Mild paranasal sinus mucosal thickening. No acute orbital findings. Other: No mastoid effusions. CT CERVICAL SPINE FINDINGS Alignment: Similar anterolisthesis of C4 on C5, C7 on T1 and T1 on T2. No new sagittal subluxation. Skull base and vertebrae: Vertebral body heights are maintained. No evidence of acute fracture. Soft tissues and spinal canal: No prevertebral fluid or swelling. No visible canal hematoma. Disc levels: Similar multilevel degenerative disc disease  and multilevel facet/uncovertebral hypertrophy with varying degrees of neural foraminal stenosis. Upper chest: Visualized lung apices are clear. IMPRESSION: 1. No evidence of acute intracranial abnormality. 2. Right posterior vertex scalp contusion without acute calvarial fracture. 3. No evidence of acute fracture or traumatic malalignment in the cervical spine. Electronically Signed   By: Margaretha Sheffield M.D.   On: 03/11/2022 08:27   DG Chest Portable 1 View  Result Date: 03/11/2022 CLINICAL DATA:  Provided history: Status post reduction. Shortness of breath. EXAM: PORTABLE CHEST 1 VIEW COMPARISON:  Chest CT 10/02/2020. Prior chest radiographs 10/22/2018 and earlier. FINDINGS: Left chest multi lead implantable cardiac device. Cardiomegaly. Aortic atherosclerosis. Central pulmonary vascular congestion with suspected mild interstitial edema. Chronic elevation of the right hemidiaphragm. Small right pleural effusion. Additional opacity at the right lung base which may reflect atelectasis and/or consolidation. No evidence of pneumothorax. No acute bony abnormality identified. IMPRESSION: Cardiomegaly with central pulmonary vascular congestion and suspected mild interstitial edema. Chronic elevation of the right hemidiaphragm. Small right pleural effusion. Underlying atelectasis and/or consolidation at the right lung  base. Aortic Atherosclerosis (ICD10-I70.0). Electronically Signed   By: Kellie Simmering D.O.   On: 03/11/2022 08:15   DG Ankle 2 Views Right  Result Date: 03/11/2022 CLINICAL DATA:  Status post reduction. EXAM: RIGHT ANKLE - 2 VIEW COMPARISON:  March 11, 2022 FINDINGS: Redemonstrated is comminuted impacted fracture of the distal right tibia. Persistent lateral displacement of the main distal fracture fragment and approximately 1.3 cm impaction. Butterfly fragment is seen posterior to the oblique fracture line. Similar comminuted fracture of the distal right fibula with persistent impaction and angulation. Associated soft tissue swelling. Cast material overlies the soft tissues and obscures details. IMPRESSION: Persistent comminuted impacted fractures of the distal right tibia and fibula. Persistent lateral displacement of the main distal fracture fragment of the tibia and approximately 1.3 cm impaction. Electronically Signed   By: Fidela Salisbury M.D.   On: 03/11/2022 08:15   DG Hip Unilat W or Wo Pelvis 2-3 Views Right  Result Date: 03/11/2022 CLINICAL DATA:  Status post fall. EXAM: DG HIP (WITH OR WITHOUT PELVIS) 2-3V RIGHT COMPARISON:  05/24/21 FINDINGS: The bones appear osteopenic. Degenerative changes noted within the imaged portions of the lumbar spine. Signs of previous IM nail fixation of the right femur. No signs of acute fracture or dislocation. Mild bilateral and symmetric osteoarthritis noted within the hips. IMPRESSION: 1. No acute findings. 2. 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 Electronically Signed   By: Kerby Moors M.D.   On: 03/11/2022 05:42   DG Ankle Complete Right  Result Date: 03/11/2022 CLINICAL DATA:  Status post fall. EXAM: RIGHT ANKLE - COMPLETE 3+ VIEW COMPARISON:  None Available. FINDINGS: The bones appear osteopenic. There is diffuse soft tissue swelling. Vascular calcifications are identified there are acute, comminuted fracture deformities involving  the distal diaphysis of the fibula and tibia. Lateral angulation of the distal fracture fragments identified. Mild lateral and dorsal displacement of the distal fracture fragments also noted. IMPRESSION: 1. Acute, comminuted fracture deformities involve the distal diaphysis of the fibula and tibia. Lateral angulation of the distal fracture fragments. 2. Soft tissue swelling. Electronically Signed   By: Kerby Moors M.D.   On: 03/11/2022 05:41        Scheduled Meds:  [MAR Hold] budesonide  2 mL Inhalation Daily   [MAR Hold] busPIRone  10 mg Oral BID   [MAR Hold] cholecalciferol  1,000 Units Oral Q breakfast   [MAR Hold] cyanocobalamin  500 mcg Oral Daily   [MAR Hold] escitalopram  20 mg Oral Daily   [MAR Hold] famotidine  20 mg Oral Daily   [MAR Hold] folic acid  1 mg Oral Daily   [MAR Hold] furosemide  20 mg Intravenous Q12H   [MAR Hold] insulin aspart  0-15 Units Subcutaneous Q4H   [MAR Hold] lamoTRIgine  100 mg Oral Daily   [MAR Hold] mirtazapine  15 mg Oral QHS   [MAR Hold] pramipexole  0.25 mg Oral Daily   [MAR Hold] pravastatin  20 mg Oral Daily   [MAR Hold] pregabalin  100 mg Oral BID   [MAR Hold] Tofacitinib Citrate  1 tablet Oral BID   [MAR Hold] topiramate  25 mg Oral QHS   [MAR Hold] traZODone  100 mg Oral QHS   Continuous Infusions:  [MAR Hold]  ceFAZolin (ANCEF) IV     [MAR Hold] methocarbamol (ROBAXIN) IV 500 mg (03/11/22 2257)    Assessment & Plan:   Principal Problem:   Fall Active Problems:   AMS (altered mental status)   Seropositive rheumatoid arthritis ([place])   Acute on chronic diastolic CHF (congestive heart failure) (HCC)   Seizure (HCC)   Ankle fracture, right   HTN (hypertension)   GERD (gastroesophageal reflux disease)   Hypokalemia   Diabetes mellitus ([place])   Depression   Pancytopenia (HCC)   Chronic obstructive pulmonary disease ([place])  Fall Status post mechanical fall with a right ankle fracture Place patient on fall precautions 8/3 plan  for OR today.   AMS (altered mental status) Acute metabolic encephalopathy Patient noted to be very lethargic during my assessment, will open eyes to loud verbal stimuli but unable to stay awake enough to provide any history. Mental status changes appear to be secondary to opioids that patient received for pain control 8/3 improved and at baseline. Caution with pain meds    Acute on chronic diastolic CHF (congestive heart failure) (HCC) Noted to have cardiomegaly and evidence of pulmonary vascular congestion on chest x-ray 2D echocardiogram from 05/23 shows an LVEF of about 55% with normal RV systolic function and severe valvular regurgitation. Continue diuretic therapy Hold lisinopril due to relative hypotension 8/3 treated with IV Lasix.  Clinically improving Cardiology was consulted input appreciated Echo with normal EF  Hypokalemia Replace with KCl monitor while on diuretics   Seropositive rheumatoid arthritis ([place]) Stable 8/3 continue weekly methotrexate and tofacitinib      Ankle fracture, right Status post mechanical fall with a right ankle fracture Imaging shows an acute, comminuted fracture deformities involve the distal diaphysis of the fibula and tibia. Lateral angulation of the distal fracture fragments. Soft tissue swelling. Patient is status post reduction in the ER and orthopedic surgery has been consulted for surgical repair Pain control Muscle relaxants Immobilize right lower extremity 8/3 Per cardiology patient is mild to moderate risk from cardiac standpoint   Seizure ([place]) Stable Place patient on seizure precautions Continue Lamictal   HTN (hypertension) Bp on low side after diuretics Hold lisinopril   GERD (gastroesophageal reflux disease) Stable Continue Protonix   Diabetes mellitus ([place]) Hold oral hypoglycemic agents Check blood sugars every 4 hours while patient is n.p.o.      Pancytopenia ([place]) Most likely secondary to chronic  illness Monitor closely during this hospitalization   Depression Stable Resume buspirone, mirtazapine and trazodone once able   Chronic obstructive pulmonary disease (HCC) Stable and not acutely exacerbated Continue inhaled steroids and as needed bronchodilator therapy     DVT  prophylaxis: scd Code Status:full Family Communication: none at bedisde Disposition Plan:  Status is: Inpatient Remains inpatient appropriate because: iv treatment. Going for surgery        LOS: 1 day   Time spent: 35 min    Nolberto Hanlon, MD [facility] Pager [phone]  If 7PM-7AM, please contact night-coverage 03/12/2022, 1:06 PM

## 2022-03-13 ENCOUNTER — Encounter: Payer: Self-pay | Admitting: Orthopedic Surgery

## 2022-03-13 DIAGNOSIS — S82891K Other fracture of right lower leg, subsequent encounter for closed fracture with nonunion: Secondary | ICD-10-CM | POA: Diagnosis not present

## 2022-03-13 DIAGNOSIS — I10 Essential (primary) hypertension: Secondary | ICD-10-CM

## 2022-03-13 DIAGNOSIS — W19XXXD Unspecified fall, subsequent encounter: Secondary | ICD-10-CM | POA: Diagnosis not present

## 2022-03-13 DIAGNOSIS — R400 Somnolence: Secondary | ICD-10-CM | POA: Diagnosis not present

## 2022-03-13 DIAGNOSIS — I5033 Acute on chronic diastolic (congestive) heart failure: Secondary | ICD-10-CM | POA: Diagnosis not present

## 2022-03-13 LAB — BASIC METABOLIC PANEL
Anion gap: 5 (ref 5–15)
BUN: 13 mg/dL (ref 8–23)
CO2: 28 mmol/L (ref 22–32)
Calcium: 8.3 mg/dL — ABNORMAL LOW (ref 8.9–10.3)
Chloride: 108 mmol/L (ref 98–111)
Creatinine, Ser: 0.81 mg/dL (ref 0.44–1.00)
GFR, Estimated: >60 mL/min (ref >60)
Glucose, Bld: 135 mg/dL — ABNORMAL HIGH (ref 70–99)
Potassium: 4.3 mmol/L (ref 3.5–5.1)
Sodium: 141 mmol/L (ref 135–145)

## 2022-03-13 LAB — CBC
HCT: 26.4 % — ABNORMAL LOW (ref 36.0–46.0)
Hemoglobin: 8.7 g/dL — ABNORMAL LOW (ref 12.0–15.0)
MCH: 31.3 pg (ref 26.0–34.0)
MCHC: 33 g/dL (ref 30.0–36.0)
MCV: 95 fL (ref 80.0–100.0)
Platelets: 94 10*3/uL — ABNORMAL LOW (ref 150–400)
RBC: 2.78 MIL/uL — ABNORMAL LOW (ref 3.87–5.11)
RDW: 13.2 % (ref 11.5–15.5)
WBC: 3.3 10*3/uL — ABNORMAL LOW (ref 4.0–10.5)
nRBC: 0 % (ref 0.0–0.2)

## 2022-03-13 LAB — GLUCOSE, CAPILLARY
Glucose-Capillary: 111 mg/dL — ABNORMAL HIGH (ref 70–99)
Glucose-Capillary: 107 mg/dL — ABNORMAL HIGH (ref 70–99)
Glucose-Capillary: 128 mg/dL — ABNORMAL HIGH (ref 70–99)
Glucose-Capillary: 141 mg/dL — ABNORMAL HIGH (ref 70–99)
Glucose-Capillary: 81 mg/dL (ref 70–99)
Glucose-Capillary: 110 mg/dL — ABNORMAL HIGH (ref 70–99)

## 2022-03-13 LAB — BRAIN NATRIURETIC PEPTIDE: B Natriuretic Peptide: 192.1 pg/mL — ABNORMAL HIGH (ref 0.0–100.0)

## 2022-03-13 MED ORDER — MIDODRINE HCL 5 MG PO TABS
5.0000 mg | ORAL_TABLET | Freq: Three times a day (TID) | ORAL | Status: DC
  Administered 2022-03-13 – 2022-03-16 (×9): 5 mg via ORAL
  Filled 2022-03-13 (×9): qty 1

## 2022-03-13 NOTE — Evaluation (Signed)
Physical Therapy Evaluation Patient Details Name: Meghan [name] MRN: [id number] DOB: Dec 23, 1945 Today's Date: 03/13/2022  History of Present Illness  Pt is a 76 y/o F admitted on 03/11/22 after presenting to the hospital for evaluation s/p fall. Pt noted to have R ankel fx & AMS. Pt underwent R ORIF on 03/12/22 by Dr. Mack Guise. Pt was noted to be hypotensive postoperatively. PMH: RA, COPD, CAD, HTN, dyslipidemia, s/p pacemaker insertion   Clinical Impression  Pt seen for PT evaluation with co-tx with OT. Pt is a resident at [facility] care center long term care where she primarily uses w/c for mobility but will occasionally use RW to ambulate to/from bathroom. On this date, pt is able to complete supine>sit with CGA but requires +2 assist to complete successful stand pivot to recliner as pt with difficulty completing pivot portion. Pt received on 2L/min but titrated down to room air with SpO2 WNL.   BP in R wrist: 111/53 mmHg MAP 69 supine 112/74 mmHg MAP 86 sitting EOB    Recommendations for follow up therapy are one component of a multi-disciplinary discharge planning process, led by the attending physician.  Recommendations may be updated based on patient status, additional functional criteria and insurance authorization.  Follow Up Recommendations Skilled nursing-short term rehab (<3 hours/day) Can patient physically be transported by private vehicle: No    Assistance Recommended at Discharge Frequent or constant Supervision/Assistance  Patient can return home with the following  A lot of help with walking and/or transfers;A lot of help with bathing/dressing/bathroom;Assist for transportation;Assistance with cooking/housework;Help with stairs or ramp for entrance    Equipment Recommendations None recommended by PT  Recommendations for Other Services       Functional Status Assessment Patient has had a recent decline in their functional status and demonstrates the ability to make  significant improvements in function in a reasonable and predictable amount of time.     Precautions / Restrictions Precautions Precautions: Fall Required Braces or Orthoses: Splint/Cast Splint/Cast: splint/cast on R LE Restrictions Weight Bearing Restrictions: Yes RLE Weight Bearing: Non weight bearing      Mobility  Bed Mobility Overal bed mobility: Needs Assistance Bed Mobility: Supine to Sit     Supine to sit: Min guard          Transfers Overall transfer level: Needs assistance Equipment used: None Transfers: Bed to chair/wheelchair/BSC       Squat pivot transfers: Max assist, +2 physical assistance     General transfer comment: PT supporting RLE to ensure she maintains NWB RLE    Ambulation/Gait                  Stairs            Wheelchair Mobility    Modified Rankin (Stroke Patients Only)       Balance Overall balance assessment: Needs assistance Sitting-balance support: Feet unsupported, No upper extremity supported Sitting balance-Leahy Scale: Fair                                       Pertinent Vitals/Pain Pain Assessment Pain Assessment: No/denies pain Faces Pain Scale: Hurts a little bit Pain Location: R LE Pain Descriptors / Indicators: Discomfort, Sore Pain Intervention(s): Limited activity within patient's tolerance, Monitored during session    Home Living Family/patient expects to be discharged to:: Skilled nursing facility  Additional Comments: Pt reports she lives at [facility]    Prior Function Prior Level of Function : Independent/Modified Independent             Mobility Comments: Primarily uses a wheelchair for mobility, but occasionally uses RW for short distances to/from the bathroom ADLs Comments: Pt reports independent with dressing/bathing/toileting, but staff is able and occasionally provides assistance with ADLs.     Hand Dominance         Extremity/Trunk Assessment   Upper Extremity Assessment Upper Extremity Assessment: Generalized weakness    Lower Extremity Assessment Lower Extremity Assessment: Generalized weakness;RLE deficits/detail RLE Deficits / Details: RLE in splint       Communication   Communication: No difficulties  Cognition Arousal/Alertness: Awake/alert Behavior During Therapy: WFL for tasks assessed/performed Overall Cognitive Status: Within Functional Limits for tasks assessed                                 General Comments: Pt had delayed responses, but answered all questions appropriately        General Comments      Exercises     Assessment/Plan    PT Assessment Patient needs continued PT services  PT Problem List Decreased strength;Decreased activity tolerance;Decreased mobility;Decreased safety awareness;Decreased knowledge of precautions;Decreased balance;Decreased knowledge of use of DME       PT Treatment Interventions Therapeutic exercise;DME instruction;Gait training;Balance training;Stair training;Neuromuscular re-education;Manual techniques;Therapeutic activities;Patient/family education;Functional mobility training;Modalities    PT Goals (Current goals can be found in the Care Plan section)  Acute Rehab PT Goals Patient Stated Goal: get better PT Goal Formulation: With patient Time For Goal Achievement: 03/27/22 Potential to Achieve Goals: Good    Frequency 7X/week     Co-evaluation PT/OT/SLP Co-Evaluation/Treatment: Yes Reason for Co-Treatment: For patient/therapist safety (anticpated low BP) PT goals addressed during session: Mobility/safety with mobility OT goals addressed during session: ADL's and self-care       AM-PAC PT "6 Clicks" Mobility  Outcome Measure Help needed turning from your back to your side while in a flat bed without using bedrails?: None Help needed moving from lying on your back to sitting on the side of a flat bed without  using bedrails?: A Little Help needed moving to and from a bed to a chair (including a wheelchair)?: A Lot Help needed standing up from a chair using your arms (e.g., wheelchair or bedside chair)?: Total Help needed to walk in hospital room?: Total Help needed climbing 3-5 steps with a railing? : Total 6 Click Score: 12    End of Session   Activity Tolerance: Patient tolerated treatment well Patient left: in chair;with call bell/phone within reach Nurse Communication: Mobility status (O2) PT Visit Diagnosis: Unsteadiness on feet (R26.81);Muscle weakness (generalized) (M62.81);Difficulty in walking, not elsewhere classified (R26.2);Other abnormalities of gait and mobility (R26.89)    Time: 2376-2831 PT Time Calculation (min) (ACUTE ONLY): 17 min   Charges:   PT Evaluation $PT Eval Low Complexity: [place], PT, DPT 03/13/22, 12:29 PM   Waunita Schooner 03/13/2022, 12:24 PM

## 2022-03-13 NOTE — Progress Notes (Signed)
Patient's previous condition has improved. Patient able to maintain appropriate BP and mentation has much improved. Spontaneous eye opening and continues to be orientated x 4.

## 2022-03-13 NOTE — Progress Notes (Signed)
[employer] - Patient moved to stepdown from PACU for closer monitoring of her hypotension and resp status as she became lethargic and hypotensive post surgical ankle repair. Informed via secure chat that patient was lethargic and hypotensive after receiving her shceduled lasix and trazodone   Review of MAR shows patient received trazodone, lasix, lyrica, remeron and buspar.   Action - 250 cc fluid bolus 25 gm albumin Instructed nurse to start continuous iV fluids previously ordered when bolus is done Maintain BIPAP in place Remeron, robaxin, tramadol lyrica, dilaudid and oxycodone as well as lasix discontinued  Response - BP and mental status improved

## 2022-03-13 NOTE — Progress Notes (Signed)
Physical Therapy Treatment Patient Details Name: Meghan [name] MRN: [id number] DOB: August 30, 1945 Today's Date: 03/13/2022   History of Present Illness Pt is a 76 y/o F admitted on 03/11/22 after presenting to the hospital for evaluation s/p fall. Pt noted to have R ankel fx & AMS. Pt underwent R ORIF on 03/12/22 by Dr. Mack Guise. Pt was noted to be hypotensive postoperatively. PMH: RA, COPD, CAD, HTN, dyslipidemia, s/p pacemaker insertion    PT Comments    Pt seen for PT tx with pt endorsing more pain this session - nurse notified of request for meds. Pt engages in RLE strengthening exercises with cuing for technique. Pt transfers STS with mod assist with poor awareness of hand placement despite cuing & attempts stand pivot with RW but decreased ability to pivot foot; pt also endorses fear of performing this transfer. Pt requires mod assist for squat pivot transfer with cuing for safe hand placement & assistance with pivoting as pt able to lift buttocks but not pivot well. Pt c/o R knee feeling warm but PT did not observe this. Will continue to follow pt acutely to address strengthening, balance, and transfers & gait with LRAD.     Recommendations for follow up therapy are one component of a multi-disciplinary discharge planning process, led by the attending physician.  Recommendations may be updated based on patient status, additional functional criteria and insurance authorization.  Follow Up Recommendations  Skilled nursing-short term rehab (<3 hours/day) Can patient physically be transported by private vehicle: No   Assistance Recommended at Discharge Frequent or constant Supervision/Assistance  Patient can return home with the following A lot of help with walking and/or transfers;A lot of help with bathing/dressing/bathroom;Assist for transportation;Assistance with cooking/housework;Help with stairs or ramp for entrance   Equipment Recommendations  None recommended by PT    Recommendations for  Other Services       Precautions / Restrictions Precautions Precautions: Fall Required Braces or Orthoses: Splint/Cast Splint/Cast: splint/cast on R LE Restrictions Weight Bearing Restrictions: Yes RLE Weight Bearing: Non weight bearing     Mobility  Bed Mobility Overal bed mobility: Needs Assistance Bed Mobility: Sit to Supine     Supine to sit: Min guard Sit to supine: Min assist (assistance to elevate RLE onto bed)        Transfers Overall transfer level: Needs assistance Equipment used: Rolling walker (2 wheels) Transfers: Sit to/from Stand Sit to Stand: Mod assist, Min assist (pt transfers STS from recliner with poor awareness & poor following of commands for safe hand placement & elects to pull up on RW vs pushing to stand)     Squat pivot transfers: Mod assist (assistance & cuing to pivot as pt able to lift buttocks but poor ability to pivot)     General transfer comment: cuing for RLE NWB & Pt maintains well    Ambulation/Gait                   Stairs             Wheelchair Mobility    Modified Rankin (Stroke Patients Only)       Balance Overall balance assessment: Needs assistance Sitting-balance support: Feet supported Sitting balance-Leahy Scale: Fair     Standing balance support: During functional activity, Bilateral upper extremity supported Standing balance-Leahy Scale: Poor                              Cognition Arousal/Alertness:  Awake/alert Behavior During Therapy: WFL for tasks assessed/performed Overall Cognitive Status: Within Functional Limits for tasks assessed                                 General Comments: Pt had delayed responses, but answered all questions appropriately        Exercises General Exercises - Lower Extremity Long Arc Quad: AROM, Strengthening, Right, 15 reps, Seated Hip Flexion/Marching: AROM, Strengthening, Right, 15 reps, Seated    General Comments General  comments (skin integrity, edema, etc.): HR 112-115 bpm received in recliner, max HR of 121 bpm during mobility - nurse notified; pt also on room air with lowest SPO2 of 87% but quickly returns to >/= 90%      Pertinent Vitals/Pain Pain Assessment Pain Assessment: Faces Faces Pain Scale: Hurts even more Pain Location: R LE, L foot Pain Descriptors / Indicators: Discomfort, Sore Pain Intervention(s): Monitored during session, Limited activity within patient's tolerance, Patient requesting pain meds-RN notified    Home Living Family/patient expects to be discharged to:: Skilled nursing facility                   Additional Comments: Pt reports she lives at [facility]    Prior Function            PT Goals (current goals can now be found in the care plan section) Acute Rehab PT Goals Patient Stated Goal: get better PT Goal Formulation: With patient Time For Goal Achievement: 03/27/22 Potential to Achieve Goals: Good Progress towards PT goals: Progressing toward goals    Frequency    BID      PT Plan Current plan remains appropriate    Co-evaluation PT/OT/SLP Co-Evaluation/Treatment: Yes Reason for Co-Treatment: For patient/therapist safety (anticpated low BP) PT goals addressed during session: Mobility/safety with mobility OT goals addressed during session: ADL's and self-care      AM-PAC PT "6 Clicks" Mobility   Outcome Measure  Help needed turning from your back to your side while in a flat bed without using bedrails?: None Help needed moving from lying on your back to sitting on the side of a flat bed without using bedrails?: A Little Help needed moving to and from a bed to a chair (including a wheelchair)?: A Lot Help needed standing up from a chair using your arms (e.g., wheelchair or bedside chair)?: A Lot Help needed to walk in hospital room?: Total Help needed climbing 3-5 steps with a railing? : Total 6 Click Score: 13    End of Session  Equipment Utilized During Treatment: Gait belt Activity Tolerance: Patient tolerated treatment well;Patient limited by pain Patient left: in bed;with call bell/phone within reach Nurse Communication: Mobility status (HR, pain) PT Visit Diagnosis: Unsteadiness on feet (R26.81);Muscle weakness (generalized) (M62.81);Difficulty in walking, not elsewhere classified (R26.2);Other abnormalities of gait and mobility (R26.89)     Time: 3710-6269 PT Time Calculation (min) (ACUTE ONLY): 12 min  Charges:  $Therapeutic Activity: 8-22 mins                     Lavone Nian, PT, DPT 03/13/22, 1:42 PM    Waunita Schooner 03/13/2022, 1:41 PM

## 2022-03-13 NOTE — Progress Notes (Signed)
PROGRESS NOTE    Meghan [name]  SWN:[id number] DOB: [birth year] DOA: 03/11/2022 PCP: Garwin Brothers, MD    Brief Narrative:  Meghan [name] is a 76 y.o. female with medical history significant for rheumatoid arthritis, COPD, coronary artery disease, hypertension, dyslipidemia, status post pacemaker insertion for complete heart block who presented to the ER via EMS for evaluation following a mechanical fall. Most of the history was obtained from the chart as patient is very lethargic and unable to provide any history. Patient was said to have gotten up to use the bathroom and fell.  There was no loss of consciousness and she denied feeling dizzy or lightheaded prior to the fall.  She noted severe pain in her right ankle as well as right hip following the fall and was found to have a hematoma. She was brought into the ER and was noted to have severe deformity involving her right ankle but was able to move her toes. Patient had received IV fentanyl for pain control by EMS, she received IV morphine in the ER and was said to have taken some of her oxycodone at home for pain control. Patient's ankle fracture was reduced in the ER and orthopedic surgery was consulted for further evaluation. During my evaluation she is noted to be very lethargic but arouses to verbal stimuli and is unable to stay awake to provide any history.   8/3 awake, responding appropriately. Denies sob or cp 8/4 post op had hypotension and required bipap. Was given ivf bolus.this am on  35% 02   Consultants:  Cardiology, orthopedics, PCCM  Procedures:   Antimicrobials:      Subjective: Reports feeling better than yesterday. "Doesn't think" has sob, but has not ambulated yet. No cp . Little dizzy this am  Objective: Vitals:   03/13/22 0530 03/13/22 0600 03/13/22 0630 03/13/22 0700  BP: 98/76 (!) 105/58 (!) 112/56 108/79  Pulse: 70 68 75 (!) 46  Resp: 10 20 (!) 22 (!) 21  Temp:      TempSrc:      SpO2: 99% 100% 100% 96%   Weight:      Height:        Intake/Output Summary (Last 24 hours) at 03/13/2022 0853 Last data filed at 03/13/2022 0600 Gross per 24 hour  Intake 480 ml  Output 665 ml  Net -185 ml   Filed Weights   03/12/22 1316  Weight: 65.3 kg    Examination: Calm, NAD Decreased crackles scattered at bases  Reg s1/s2 no gallop Soft benign +bs No edema Awake and alert. Grossly intact Mood and affect appropriate in current setting     Data Reviewed: I have personally reviewed following labs and imaging studies  CBC: Recent Labs  Lab 03/11/22 0509 03/12/22 0439 03/13/22 0450  WBC 3.4* 3.2* 3.3*  NEUTROABS 2.3  --   --   HGB 11.6* 11.1* 8.7*  HCT 35.0* 33.1* 26.4*  MCV 95.9 95.4 95.0  PLT 126* 105* 94*   Basic Metabolic Panel: Recent Labs  Lab 03/11/22 0509 03/11/22 1246 03/12/22 0439 03/13/22 0450  NA 145  --  141 141  K 3.0*  --  3.0* 4.3  CL 110  --  104 108  CO2 27  --  28 28  GLUCOSE 97  --  73 135*  BUN 13  --  13 13  CREATININE 0.81  --  0.82 0.81  CALCIUM 8.0*  --  8.3* 8.3*  MG 1.9 2.0  --   --  PHOS 3.5  --   --   --    GFR: Estimated Creatinine Clearance: 43.4 mL/min (by C-G formula based on SCr of 0.81 mg/dL). Liver Function Tests: Recent Labs  Lab 03/11/22 0509  AST 18  ALT 14  ALKPHOS 42  BILITOT 0.8  PROT 5.7*  ALBUMIN 3.4*   No results for input(s): "LIPASE", "AMYLASE" in the last 168 hours. No results for input(s): "AMMONIA" in the last 168 hours. Coagulation Profile: Recent Labs  Lab 03/11/22 0509  INR 1.1   Cardiac Enzymes: No results for input(s): "CKTOTAL", "CKMB", "CKMBINDEX", "TROPONINI" in the last 168 hours. BNP (last 3 results) No results for input(s): "PROBNP" in the last 8760 hours. HbA1C: Recent Labs    03/11/22 0509  HGBA1C 4.6*   CBG: Recent Labs  Lab 03/12/22 1929 03/12/22 2212 03/12/22 2308 03/13/22 0432 03/13/22 0735  GLUCAP 193* 178* 150* 111* 107*   Lipid Profile: No results for input(s): "CHOL",  "HDL", "LDLCALC", "TRIG", "CHOLHDL", "LDLDIRECT" in the last 72 hours. Thyroid Function Tests: Recent Labs    03/11/22 0509  TSH 1.670   Anemia Panel: No results for input(s): "VITAMINB12", "FOLATE", "FERRITIN", "TIBC", "IRON", "RETICCTPCT" in the last 72 hours. Sepsis Labs: Recent Labs  Lab 03/11/22 0509  PROCALCITON <0.10    Recent Results (from the past 240 hour(s))  MRSA Next Gen by PCR, Nasal     Status: Abnormal   Collection Time: 03/12/22  7:35 PM   Specimen: Nasal Mucosa; Nasal Swab  Result Value Ref Range Status   MRSA by PCR Next Gen DETECTED (A) NOT DETECTED Final    Comment: RESULT CALLED TO, READ BACK BY AND VERIFIED WITH: SAVANNAH RIVERS '@2057'$  ON 03/12/22 SKL (NOTE) The GeneXpert MRSA Assay (FDA approved for NASAL specimens only), is one component of a comprehensive MRSA colonization surveillance program. It is not intended to diagnose MRSA infection nor to guide or monitor treatment for MRSA infections. Test performance is not FDA approved in patients less than 73 years old. Performed at [facility], [address], [place], [place] [address]          Radiology Studies: DG Ankle Complete Right  Result Date: 03/12/2022 CLINICAL DATA:  Postoperative evaluation. EXAM: RIGHT ANKLE - COMPLETE 3+ VIEW COMPARISON:  March 12, 2022 FINDINGS: The right ankle was imaged in a fiberglass cast with subsequently obscured osseous and soft tissue detail. Large radiopaque fixation plates and screws are seen along the distal right tibia and distal right fibula. Acute fracture deformities of the distal shafts of the right tibia and right fibula are also seen, with anatomic alignment. There is no evidence of dislocation. There is no evidence of arthropathy or other focal bone abnormality. Diffuse soft tissue swelling is noted with multiple radiopaque skin staples. IMPRESSION: 1. Status post open reduction and internal fixation of the distal right tibia and distal right  fibula. Electronically Signed   By: Virgina Norfolk M.D.   On: 03/12/2022 19:14   DG Tibia/Fibula Right  Result Date: 03/12/2022 CLINICAL DATA:  Right distal tibia and fibula ORIF. EXAM: RIGHT TIBIA AND FIBULA - 2 VIEW COMPARISON:  Right ankle x-rays from yesterday. FLUOROSCOPY TIME:  The device does not provide the exposure index. Fluoroscopy Time: 2 minutes, 20. Number of Acquired Images: 10 C-arm fluoroscopic images were obtained intraoperatively and submitted for post operative interpretation. FINDINGS: Multiple intraoperative fluoroscopic images demonstrate interval medial plate and screw fixation of the distal tibia diaphyseal fracture and lateral plate and screw fixation of the  distal fibula diaphyseal fracture. Alignment is anatomic. IMPRESSION: 1. Intraoperative fluoroscopic guidance for distal tibia and fibula ORIF. Electronically Signed   By: Titus Dubin M.D.   On: 03/12/2022 17:30   DG C-Arm 1-60 Min-No Report  Result Date: 03/12/2022 Fluoroscopy was utilized by the requesting physician.  No radiographic interpretation.   DG C-Arm 1-60 Min-No Report  Result Date: 03/12/2022 Fluoroscopy was utilized by the requesting physician.  No radiographic interpretation.   DG C-Arm 1-60 Min-No Report  Result Date: 03/12/2022 Fluoroscopy was utilized by the requesting physician.  No radiographic interpretation.   DG C-Arm 1-60 Min-No Report  Result Date: 03/12/2022 Fluoroscopy was utilized by the requesting physician.  No radiographic interpretation.   ECHOCARDIOGRAM COMPLETE  Result Date: 03/11/2022    ECHOCARDIOGRAM REPORT   Patient Name:   ALABAMA DOIG Date of Exam: 03/11/2022 Medical Rec #:  [id number]    Height:       55.0 in Accession #:    [id number]   Weight:       144.6 lb Date of Birth:  [birth year]    BSA:          1.527 m Patient Age:    72 years     BP:           97/70 mmHg Patient Gender: F            HR:           104 bpm. Exam Location:  [facility] Procedure: 2D Echo, Color Doppler and  Cardiac Doppler Indications:     I50.31 congestive heart failure-Acute Diastolic  History:         Patient has prior history of Echocardiogram examinations, most                  recent 10/21/2018. CHF, Pacemaker, COPD; Risk                  Factors:Hypertension, Diabetes and HCL.  Sonographer:     Charmayne Sheer Referring Phys:  [id number] Stella TANG Diagnosing Phys: Yolonda Kida MD  Sonographer Comments: Suboptimal apical window. Image acquisition challenging due to COPD, Image acquisition challenging due to respiratory motion and Image acquisition challenging due to patient body habitus. IMPRESSIONS  1. Left ventricular ejection fraction, by estimation, is 55 to 60%. The left ventricle has normal function. The left ventricle has no regional wall motion abnormalities. Left ventricular diastolic parameters are consistent with Grade I diastolic dysfunction (impaired relaxation).  2. Right ventricular systolic function is normal. The right ventricular size is normal.  3. Mitral Vavle annulous thickening/Possible Repair. The mitral valve is degenerative. Mild to moderate mitral valve regurgitation.  4. The aortic valve is normal in structure. Aortic valve regurgitation is not visualized. Aortic valve sclerosis/calcification is present, without any evidence of aortic stenosis. FINDINGS  Left Ventricle: Left ventricular ejection fraction, by estimation, is 55 to 60%. The left ventricle has normal function. The left ventricle has no regional wall motion abnormalities. The left ventricular internal cavity size was normal in size. There is  borderline left ventricular hypertrophy. Left ventricular diastolic parameters are consistent with Grade I diastolic dysfunction (impaired relaxation). Right Ventricle: The right ventricular size is normal. No increase in right ventricular wall thickness. Right ventricular systolic function is normal. Left Atrium: Left atrial size was normal in size. Right Atrium: Right atrial size  was normal in size. Pericardium: There is no evidence of pericardial effusion. Mitral Valve: Mitral Vavle annulous thickening/Possible Repair. The mitral  valve is degenerative in appearance. Mild to moderate mitral valve regurgitation. Tricuspid Valve: The tricuspid valve is normal in structure. Tricuspid valve regurgitation is mild. Aortic Valve: The aortic valve is normal in structure. Aortic valve regurgitation is not visualized. Aortic valve sclerosis/calcification is present, without any evidence of aortic stenosis. Aortic valve mean gradient measures 4.0 mmHg. Aortic valve peak  gradient measures 8.8 mmHg. Aortic valve area, by VTI measures 1.33 cm. Pulmonic Valve: The pulmonic valve was grossly normal. Pulmonic valve regurgitation is not visualized. Aorta: The ascending aorta was not well visualized. IAS/Shunts: No atrial level shunt detected by color flow Doppler.  LEFT VENTRICLE PLAX 2D LVIDd:         4.72 cm   Diastology LVIDs:         3.27 cm   LV e' lateral:   5.22 cm/s LV PW:         1.23 cm   LV E/e' lateral: 35.9 LV IVS:        0.92 cm LVOT diam:     1.60 cm LV SV:         29 LV SV Index:   19 LVOT Area:     2.01 cm  RIGHT VENTRICLE RV Basal diam:  2.94 cm LEFT ATRIUM             Index        RIGHT ATRIUM          Index LA diam:        3.80 cm 2.49 cm/m   RA Area:     6.96 cm LA Vol (A2C):   53.8 ml 35.24 ml/m  RA Volume:   10.90 ml 7.14 ml/m LA Vol (A4C):   56.5 ml 37.00 ml/m LA Biplane Vol: 56.2 ml 36.81 ml/m  AORTIC VALVE                    PULMONIC VALVE AV Area (Vmax):    1.31 cm     PV Vmax:       0.99 m/s AV Area (Vmean):   1.22 cm     PV Peak grad:  3.9 mmHg AV Area (VTI):     1.33 cm AV Vmax:           148.00 cm/s AV Vmean:          95.300 cm/s AV VTI:            0.217 m AV Peak Grad:      8.8 mmHg AV Mean Grad:      4.0 mmHg LVOT Vmax:         96.10 cm/s LVOT Vmean:        57.900 cm/s LVOT VTI:          0.144 m LVOT/AV VTI ratio: 0.66  AORTA Ao Root diam: 3.20 cm MITRAL VALVE                 TRICUSPID VALVE MV Area (PHT): 3.25 cm     TR Peak grad:   40.7 mmHg MV Decel Time: 234 msec     TR Vmax:        319.00 cm/s MV E velocity: 187.50 cm/s                             SHUNTS  Systemic VTI:  0.14 m                             Systemic Diam: 1.60 cm Yolonda Kida MD Electronically signed by Yolonda Kida MD Signature Date/Time: 03/11/2022/4:38:20 PM    Final         Scheduled Meds:  acetaminophen  1,000 mg Oral Q6H   budesonide  2 mL Inhalation Daily   busPIRone  10 mg Oral BID   Chlorhexidine Gluconate Cloth  6 each Topical Q0600   cholecalciferol  1,000 Units Oral Q breakfast   cyanocobalamin  500 mcg Oral Daily   docusate sodium  100 mg Oral BID   enoxaparin (LOVENOX) injection  40 mg Subcutaneous Q24H   escitalopram  20 mg Oral Daily   famotidine  20 mg Oral Daily   folic acid  1 mg Oral Daily   furosemide  20 mg Intravenous Q12H   insulin aspart  0-15 Units Subcutaneous Q4H   lamoTRIgine  100 mg Oral Daily   pramipexole  0.25 mg Oral Daily   pravastatin  20 mg Oral Daily   pregabalin  100 mg Oral BID   senna  1 tablet Oral BID   Tofacitinib Citrate  1 tablet Oral BID   topiramate  25 mg Oral QHS   Continuous Infusions:  sodium chloride      Assessment & Plan:   Principal Problem:   Fall Active Problems:   AMS (altered mental status)   Seropositive rheumatoid arthritis (HCC)   Acute on chronic diastolic CHF (congestive heart failure) (HCC)   Seizure (HCC)   Ankle fracture, right   HTN (hypertension)   GERD (gastroesophageal reflux disease)   Hypokalemia   Diabetes mellitus (HCC)   Depression   Pancytopenia (HCC)   Chronic obstructive pulmonary disease ([place])  Fall Status post mechanical fall with a right ankle fracture Place patient on fall precautions 8/4 PT OT   AMS (altered mental status) Acute metabolic encephalopathy Patient noted to be very lethargic during my assessment, will open eyes to loud  verbal stimuli but unable to stay awake enough to provide any history. Mental status changes appear to be secondary to opioids that patient received for pain control 8/3 improved and at baseline. Caution with pain meds 8/4 improved and at baseline   Acute on chronic diastolic CHF (congestive heart failure) (HCC) Noted to have cardiomegaly and evidence of pulmonary vascular congestion on chest x-ray 2D echocardiogram from 05/23 shows an LVEF of about 55% with normal RV systolic function and severe valvular regurgitation. Continue diuretic therapy Hold lisinopril due to relative hypotension 8/3 treated with IV Lasix.  Clinically improving Cardiology was consulted input appreciated Echo with normal EF 8/4 received IV fluid boluses and IV fluid.  Due to hypotension On midodrine BP better, will DC IV fluids Does appear mildly volume overloaded will gently diurese  Hypokalemia Replace as needed Monitor   Seropositive rheumatoid arthritis ([place]) Stable 8/3 continue weekly methotrexate and tofacitinib      Ankle fracture, right Status post mechanical fall with a right ankle fracture Imaging shows an acute, comminuted fracture deformities involve the distal diaphysis of the fibula and tibia. Lateral angulation of the distal fracture fragments. Soft tissue swelling. Patient is status post reduction in the ER and orthopedic surgery has been consulted for surgical repair Pain control Muscle relaxants Immobilize right lower extremity 8/3 Per cardiology patient is mild to moderate risk from  cardiac standpoint   Seizure ([place]) Stable Place patient on seizure precautions Continue Lamictal   HTN (hypertension) Bp on low side after diuretics Hold lisinopril   GERD (gastroesophageal reflux disease) Stable Continue Protonix   Diabetes mellitus (HCC) Hold oral hypoglycemic agents Check blood sugars every 4 hours while patient is n.p.o.      Pancytopenia ([place]) Most likely secondary  to chronic illness Monitor closely during this hospitalization   Depression Stable Resume buspirone, mirtazapine and trazodone once able   Chronic obstructive pulmonary disease (HCC) Stable and not acutely exacerbated Continue inhaled steroids and as needed bronchodilator therapy     DVT prophylaxis: scd,lovenox Code Status:full Family Communication: none at bedisde Disposition Plan:  Status is: Inpatient Remains inpatient appropriate because: iv treatment. PT/OT       LOS: 2 days   Time spent: 4 min    Nolberto Hanlon, MD [facility] Pager [phone]  If 7PM-7AM, please contact night-coverage 03/13/2022, 8:53 AM

## 2022-03-13 NOTE — Progress Notes (Addendum)
Subjective:  POD #1 s/p ORIF right tibia and fibula fractures.   Patient reports right leg pain as moderate.  Patient is off bypass, lying in bed.  No acute distress.  Patient awake and asking appropriate questions.  Patient admitted to the unit after surgery for hypotension and lethargy.   Objective:   VITALS:   Vitals:   03/13/22 0800 03/13/22 0900 03/13/22 1000 03/13/22 1100  BP: 105/62 (!) 192/181  113/70  Pulse: 64 90 96 91  Resp: 19 17 (!) 24 19  Temp:      TempSrc:      SpO2: 99% 94% 92% 94%  Weight:      Height:        PHYSICAL EXAM: Right lower extremity: AO splint in place.  Clean and dry. Neurovascular intact Slight hyperesthesias with light touch of toes Toes well perfused Patient can flex and extend her toes. dressing C/D/I Compartment soft  LABS  Results for orders placed or performed during the hospital encounter of 03/11/22 (from the past 24 hour(s))  Glucose, capillary     Status: Abnormal   Collection Time: 03/12/22  6:18 PM  Result Value Ref Range   Glucose-Capillary 184 (H) 70 - 99 mg/dL  Glucose, capillary     Status: Abnormal   Collection Time: 03/12/22  7:29 PM  Result Value Ref Range   Glucose-Capillary 193 (H) 70 - 99 mg/dL  MRSA Next Gen by PCR, Nasal     Status: Abnormal   Collection Time: 03/12/22  7:35 PM   Specimen: Nasal Mucosa; Nasal Swab  Result Value Ref Range   MRSA by PCR Next Gen DETECTED (A) NOT DETECTED  Glucose, capillary     Status: Abnormal   Collection Time: 03/12/22 10:12 PM  Result Value Ref Range   Glucose-Capillary 178 (H) 70 - 99 mg/dL  Blood gas, arterial     Status: Abnormal   Collection Time: 03/12/22 10:30 PM  Result Value Ref Range   FIO2 35 %   Inspiratory PAP 10 cmH2O   Expiratory PAP 5 cmH2O   pH, Arterial 7.36 7.35 - 7.45   pCO2 arterial 50 (H) 32 - 48 mmHg   pO2, Arterial 130 (H) 83 - 108 mmHg   Bicarbonate 28.2 (H) 20.0 - 28.0 mmol/L   Acid-Base Excess 1.9 0.0 - 2.0 mmol/L   O2 Saturation 100 %    Patient temperature 37.0    Collection site RIGHT RADIAL    Allens test (pass/fail) PASS PASS  Glucose, capillary     Status: Abnormal   Collection Time: 03/12/22 11:08 PM  Result Value Ref Range   Glucose-Capillary 150 (H) 70 - 99 mg/dL  Glucose, capillary     Status: Abnormal   Collection Time: 03/13/22  4:32 AM  Result Value Ref Range   Glucose-Capillary 111 (H) 70 - 99 mg/dL  Basic metabolic panel     Status: Abnormal   Collection Time: 03/13/22  4:50 AM  Result Value Ref Range   Sodium 141 135 - 145 mmol/L   Potassium 4.3 3.5 - 5.1 mmol/L   Chloride 108 98 - 111 mmol/L   CO2 28 22 - 32 mmol/L   Glucose, Bld 135 (H) 70 - 99 mg/dL   BUN 13 8 - 23 mg/dL   Creatinine, Ser 0.81 0.44 - 1.00 mg/dL   Calcium 8.3 (L) 8.9 - 10.3 mg/dL   GFR, Estimated >60 >60 mL/min   Anion gap 5 5 - 15  CBC  Status: Abnormal   Collection Time: 03/13/22  4:50 AM  Result Value Ref Range   WBC 3.3 (L) 4.0 - 10.5 K/uL   RBC 2.78 (L) 3.87 - 5.11 MIL/uL   Hemoglobin 8.7 (L) 12.0 - 15.0 g/dL   HCT 26.4 (L) 36.0 - 46.0 %   MCV 95.0 80.0 - 100.0 fL   MCH 31.3 26.0 - 34.0 pg   MCHC 33.0 30.0 - 36.0 g/dL   RDW 13.2 11.5 - 15.5 %   Platelets 94 (L) 150 - 400 K/uL   nRBC 0.0 0.0 - 0.2 %  Glucose, capillary     Status: Abnormal   Collection Time: 03/13/22  7:35 AM  Result Value Ref Range   Glucose-Capillary 107 (H) 70 - 99 mg/dL  Brain natriuretic peptide     Status: Abnormal   Collection Time: 03/13/22  8:36 AM  Result Value Ref Range   B Natriuretic Peptide 192.1 (H) 0.0 - 100.0 pg/mL  Glucose, capillary     Status: Abnormal   Collection Time: 03/13/22 11:42 AM  Result Value Ref Range   Glucose-Capillary 128 (H) 70 - 99 mg/dL    DG Ankle Complete Right  Result Date: 03/12/2022 CLINICAL DATA:  Postoperative evaluation. EXAM: RIGHT ANKLE - COMPLETE 3+ VIEW COMPARISON:  March 12, 2022 FINDINGS: The right ankle was imaged in a fiberglass cast with subsequently obscured osseous and soft tissue  detail. Large radiopaque fixation plates and screws are seen along the distal right tibia and distal right fibula. Acute fracture deformities of the distal shafts of the right tibia and right fibula are also seen, with anatomic alignment. There is no evidence of dislocation. There is no evidence of arthropathy or other focal bone abnormality. Diffuse soft tissue swelling is noted with multiple radiopaque skin staples. IMPRESSION: 1. Status post open reduction and internal fixation of the distal right tibia and distal right fibula. Electronically Signed   By: Virgina Norfolk M.D.   On: 03/12/2022 19:14   DG Tibia/Fibula Right  Result Date: 03/12/2022 CLINICAL DATA:  Right distal tibia and fibula ORIF. EXAM: RIGHT TIBIA AND FIBULA - 2 VIEW COMPARISON:  Right ankle x-rays from yesterday. FLUOROSCOPY TIME:  The device does not provide the exposure index. Fluoroscopy Time: 2 minutes, 20. Number of Acquired Images: 10 C-arm fluoroscopic images were obtained intraoperatively and submitted for post operative interpretation. FINDINGS: Multiple intraoperative fluoroscopic images demonstrate interval medial plate and screw fixation of the distal tibia diaphyseal fracture and lateral plate and screw fixation of the distal fibula diaphyseal fracture. Alignment is anatomic. IMPRESSION: 1. Intraoperative fluoroscopic guidance for distal tibia and fibula ORIF. Electronically Signed   By: Titus Dubin M.D.   On: 03/12/2022 17:30   DG C-Arm 1-60 Min-No Report  Result Date: 03/12/2022 Fluoroscopy was utilized by the requesting physician.  No radiographic interpretation.   DG C-Arm 1-60 Min-No Report  Result Date: 03/12/2022 Fluoroscopy was utilized by the requesting physician.  No radiographic interpretation.   DG C-Arm 1-60 Min-No Report  Result Date: 03/12/2022 Fluoroscopy was utilized by the requesting physician.  No radiographic interpretation.   DG C-Arm 1-60 Min-No Report  Result Date: 03/12/2022 Fluoroscopy  was utilized by the requesting physician.  No radiographic interpretation.    Assessment/Plan: 1 Day Post-Op   Principal Problem:   Fall Active Problems:   HTN (hypertension)   GERD (gastroesophageal reflux disease)   Depression   Hypokalemia   Chronic obstructive pulmonary disease (HCC)   Diabetes mellitus (HCC)   Seropositive rheumatoid  arthritis (HCC)   Seizure ([place])   Ankle fracture, right   Acute on chronic diastolic CHF (congestive heart failure) ([place])   AMS (altered mental status)   Pancytopenia ([place])  Patient improving post-op.  Awake and alert today.  Follows commands.  I personally reviewed the Post-op xrays which show the fractures are well aligned and hardware well positioned.  Patient is NWB on the right leg for 6-8 weeks post-op.  Start lovenox for DVT prophylaxis and continue until follow up in the office in 2 weeks.  AO splint will stay on until follow up in the office and needs to be kept clean and dry.  Patient states she has trouble walking with a walker and will need a wheelchair with a right leg support.  She will also likely need a bedside commode after discharge.   She lives at [facility] and will need the rehab side of the facility upon discharge.  Continue PT/OT as tolerated.    Thornton Park , MD 03/13/2022, 1:56 PM

## 2022-03-13 NOTE — Evaluation (Signed)
Occupational Therapy Evaluation Patient Details Name: Meghan [name] MRN: [id number] DOB: February 05, 1946 Today's Date: 03/13/2022   History of Present Illness Pt is a 76 y/o F admitted on 03/11/22 after presenting to the hospital for evaluation s/p fall. Pt noted to have R ankel fx & AMS. Pt underwent R ORIF on 03/12/22 by Dr. Mack Guise. Pt was noted to be hypotensive postoperatively. PMH: RA, COPD, CAD, HTN, dyslipidemia, s/p pacemaker insertion   Clinical Impression   Pt was seen for PT/OT co-evaluation this date. Prior to hospital admission, pt was using a wheelchair for mobility and independent with ADLs (occasionally needs assistance from staff). Pt lives at [facility]. Pt had delayed responses, but answered all questions appropriately. Pt presents to acute OT demonstrating impaired ADL performance and functional mobility 2/2 decreased activity tolerance and functional strength/ROM/balance deficits. Pt currently requires CGA for supine>sit.  MAX A for donning/doffing sock on L LE while seated EOB. MAX A x2 maintain NWB pcns for squat pivot t/f from bed to chair. Anticipate SUPERVISION/SETUP for grooming tasks and upper body dressing while seated. Pt educated on NWB pcns and safe ADL participation. Pt verbalized understanding of instruction. Pt left in chair with all needs met. Pt would benefit from skilled OT to address noted impairments and functional limitations (see below for any additional details). Upon hospital discharge, recommend STR to maximize pt safety and return to PLOF.      Recommendations for follow up therapy are one component of a multi-disciplinary discharge planning process, led by the attending physician.  Recommendations may be updated based on patient status, additional functional criteria and insurance authorization.   Follow Up Recommendations  Skilled nursing-short term rehab (<3 hours/day)    Assistance Recommended at Discharge Frequent or constant  Supervision/Assistance  Patient can return home with the following Two people to help with walking and/or transfers;A lot of help with bathing/dressing/bathroom;Assistance with cooking/housework;Direct supervision/assist for medications management;Assist for transportation;Help with stairs or ramp for entrance    Functional Status Assessment  Patient has had a recent decline in their functional status and demonstrates the ability to make significant improvements in function in a reasonable and predictable amount of time.  Equipment Recommendations  BSC/3in1    Recommendations for Other Services       Precautions / Restrictions Precautions Precautions: Fall Required Braces or Orthoses: Splint/Cast Splint/Cast: splint/cast on R LE Restrictions Weight Bearing Restrictions: Yes RLE Weight Bearing: Non weight bearing      Mobility Bed Mobility Overal bed mobility: Needs Assistance Bed Mobility: Supine to Sit     Supine to sit: Min guard          Transfers Overall transfer level: Needs assistance Equipment used: None Transfers: Bed to chair/wheelchair/BSC     Squat pivot transfers: Max assist, +2 physical assistance              Balance Overall balance assessment: Needs assistance Sitting-balance support: Feet unsupported, No upper extremity supported Sitting balance-Leahy Scale: Fair     Standing balance support: Bilateral upper extremity supported Standing balance-Leahy Scale: Poor                             ADL either performed or assessed with clinical judgement   ADL Overall ADL's : Needs assistance/impaired  General ADL Comments: MAX A for donning/doffing sock on L LE while seated EOB.      Pertinent Vitals/Pain Pain Assessment Pain Assessment: Faces Faces Pain Scale: Hurts a little bit Pain Location: R LE Pain Descriptors / Indicators: Discomfort, Sore Pain Intervention(s):  Limited activity within patient's tolerance, Monitored during session, Repositioned     Hand Dominance     Extremity/Trunk Assessment Upper Extremity Assessment Upper Extremity Assessment: Generalized weakness   Lower Extremity Assessment Lower Extremity Assessment: Generalized weakness;RLE deficits/detail RLE Deficits / Details: RLE in splint       Communication Communication Communication: No difficulties   Cognition Arousal/Alertness: Awake/alert Behavior During Therapy: WFL for tasks assessed/performed Overall Cognitive Status: Within Functional Limits for tasks assessed                                 General Comments: Pt had delayed responses, but answered all questions appropriately                Home Living Family/patient expects to be discharged to:: Skilled nursing facility                                 Additional Comments: Pt reports she lives at [facility]      Prior Functioning/Environment Prior Level of Function : Independent/Modified Independent             Mobility Comments: Primarily uses a wheelchair for mobility, but occasionally uses RW for short distances to/from the bathroom ADLs Comments: Pt reports independent with dressing/bathing/toileting, but staff is able and occasionally provides assistance with ADLs.        OT Problem List: Decreased strength;Decreased range of motion;Decreased activity tolerance;Impaired balance (sitting and/or standing)      OT Treatment/Interventions: Self-care/ADL training;Therapeutic exercise;Therapeutic activities;Patient/family education;Balance training;Energy conservation;DME and/or AE instruction    OT Goals(Current goals can be found in the care plan section) Acute Rehab OT Goals Patient Stated Goal: to get stronger OT Goal Formulation: With patient Time For Goal Achievement: 03/27/22 Potential to Achieve Goals: Good ADL Goals Pt Will Perform Grooming:  sitting;with modified independence Pt Will Perform Lower Body Dressing: with min assist;sitting/lateral leans Pt Will Transfer to Toilet: squat pivot transfer;with min assist;bedside commode (with LRAD)  OT Frequency: Min 2X/week    Co-evaluation PT/OT/SLP Co-Evaluation/Treatment: Yes Reason for Co-Treatment: For patient/therapist safety (anticpated low BP) PT goals addressed during session: Mobility/safety with mobility OT goals addressed during session: ADL's and self-care      AM-PAC OT "6 Clicks" Daily Activity     Outcome Measure Help from another person eating meals?: None Help from another person taking care of personal grooming?: A Little Help from another person toileting, which includes using toliet, bedpan, or urinal?: A Lot Help from another person bathing (including washing, rinsing, drying)?: A Lot Help from another person to put on and taking off regular upper body clothing?: A Little Help from another person to put on and taking off regular lower body clothing?: A Lot 6 Click Score: 16   End of Session Equipment Utilized During Treatment: Other (comment) (none)  Activity Tolerance: Patient tolerated treatment well Patient left: in chair;with call bell/phone within reach;with chair alarm set;with SCD's reapplied  OT Visit Diagnosis: Unsteadiness on feet (R26.81);Muscle weakness (generalized) (M62.81)                Time: 7616-0737  OT Time Calculation (min): 18 min Charges:  OT General Charges $OT Visit: 1 Visit OT Evaluation $OT Eval Moderate Complexity: 1 Mod [employer] OTDS [employer] 03/13/2022, 12:34 PM

## 2022-03-14 DIAGNOSIS — R400 Somnolence: Secondary | ICD-10-CM | POA: Diagnosis not present

## 2022-03-14 DIAGNOSIS — W19XXXD Unspecified fall, subsequent encounter: Secondary | ICD-10-CM | POA: Diagnosis not present

## 2022-03-14 DIAGNOSIS — E119 Type 2 diabetes mellitus without complications: Secondary | ICD-10-CM | POA: Diagnosis not present

## 2022-03-14 DIAGNOSIS — I5033 Acute on chronic diastolic (congestive) heart failure: Secondary | ICD-10-CM | POA: Diagnosis not present

## 2022-03-14 LAB — BASIC METABOLIC PANEL
Anion gap: 6 (ref 5–15)
BUN: 19 mg/dL (ref 8–23)
CO2: 29 mmol/L (ref 22–32)
Calcium: 8.5 mg/dL — ABNORMAL LOW (ref 8.9–10.3)
Chloride: 108 mmol/L (ref 98–111)
Creatinine, Ser: 0.67 mg/dL (ref 0.44–1.00)
GFR, Estimated: >60 mL/min (ref >60)
Glucose, Bld: 88 mg/dL (ref 70–99)
Potassium: 3.2 mmol/L — ABNORMAL LOW (ref 3.5–5.1)
Sodium: 143 mmol/L (ref 135–145)

## 2022-03-14 LAB — PLATELET COUNT: Platelets: 88 10*3/uL — ABNORMAL LOW (ref 150–400)

## 2022-03-14 LAB — GLUCOSE, CAPILLARY
Glucose-Capillary: 94 mg/dL (ref 70–99)
Glucose-Capillary: 80 mg/dL (ref 70–99)
Glucose-Capillary: 157 mg/dL — ABNORMAL HIGH (ref 70–99)
Glucose-Capillary: 112 mg/dL — ABNORMAL HIGH (ref 70–99)
Glucose-Capillary: 110 mg/dL — ABNORMAL HIGH (ref 70–99)

## 2022-03-14 LAB — HEMOGLOBIN AND HEMATOCRIT, BLOOD
HCT: 23.3 % — ABNORMAL LOW (ref 36.0–46.0)
Hemoglobin: 7.6 g/dL — ABNORMAL LOW (ref 12.0–15.0)

## 2022-03-14 MED ORDER — POTASSIUM CHLORIDE CRYS ER 20 MEQ PO TBCR
40.0000 meq | EXTENDED_RELEASE_TABLET | Freq: Once | ORAL | Status: AC
  Administered 2022-03-14: 40 meq via ORAL
  Filled 2022-03-14: qty 2

## 2022-03-14 MED ORDER — TRAMADOL HCL 50 MG PO TABS
50.0000 mg | ORAL_TABLET | Freq: Three times a day (TID) | ORAL | Status: DC | PRN
  Administered 2022-03-14 – 2022-03-15 (×3): 50 mg via ORAL
  Filled 2022-03-14 (×3): qty 1

## 2022-03-14 NOTE — Progress Notes (Signed)
Physical Therapy Treatment Patient Details Name: Meghan [name] MRN: [id number] DOB: August 26, 1945 Today's Date: 03/14/2022   History of Present Illness Pt is a 76 y/o F admitted on 03/11/22 after presenting to the hospital for evaluation s/p fall. Pt noted to have R ankel fx & AMS. Pt underwent R ORIF on 03/12/22 by Dr. Mack Guise. Pt was noted to be hypotensive postoperatively. PMH: RA, COPD, CAD, HTN, dyslipidemia, s/p pacemaker insertion    PT Comments    Pt seen for PT tx with pt agreeable but endorsing pain in lateral aspect of R ankle (premedicated). Pt performs RLE strengthening exercises with cuing for technique; pt even progresses to static standing & performing 5 reps of RLE hip flexion. Pt stands with RW but is unable to scoot L foot across floor 2/2 BUE/LLE & general weakness. Pt tolerates static standing ~1 minute before fatigue. Will update frequency to QD vs BID but will continue to follow pt acutely to address balance, transfers, and strengthening.   Recommendations for follow up therapy are one component of a multi-disciplinary discharge planning process, led by the attending physician.  Recommendations may be updated based on patient status, additional functional criteria and insurance authorization.  Follow Up Recommendations  Skilled nursing-short term rehab (<3 hours/day) Can patient physically be transported by private vehicle: No   Assistance Recommended at Discharge Frequent or constant Supervision/Assistance  Patient can return home with the following A lot of help with walking and/or transfers;A lot of help with bathing/dressing/bathroom;Assist for transportation;Assistance with cooking/housework;Help with stairs or ramp for entrance   Equipment Recommendations  None recommended by PT    Recommendations for Other Services       Precautions / Restrictions Precautions Precautions: Fall Required Braces or Orthoses: Splint/Cast Splint/Cast: splint/cast on R  LE Restrictions Weight Bearing Restrictions: Yes RLE Weight Bearing: Non weight bearing     Mobility  Bed Mobility Overal bed mobility: Needs Assistance Bed Mobility: Supine to Sit     Supine to sit: Supervision (HOB only slightly elevated, use of bed rails)          Transfers Overall transfer level: Needs assistance Equipment used: Rolling walker (2 wheels) Transfers: Sit to/from Stand Sit to Stand: Min assist Stand pivot transfers: Min assist, Mod assist         General transfer comment: STS from recliner x 2 times with extra time to power up to standing    Ambulation/Gait                   Stairs             Wheelchair Mobility    Modified Rankin (Stroke Patients Only)       Balance Overall balance assessment: Needs assistance Sitting-balance support: Feet supported Sitting balance-Leahy Scale: Good     Standing balance support: During functional activity, Bilateral upper extremity supported, Reliant on assistive device for balance Standing balance-Leahy Scale: Fair Standing balance comment: close supervision static standing                            Cognition Arousal/Alertness: Awake/alert Behavior During Therapy: WFL for tasks assessed/performed Overall Cognitive Status: Within Functional Limits for tasks assessed                                          Exercises Total Joint Exercises Hip  ABduction/ADduction: AROM, Strengthening, Right, 10 reps, Supine Straight Leg Raises: AAROM, AROM, Strengthening, Right, 10 reps, Supine Long Arc Quad: AROM, Strengthening, Right, 10 reps, Seated General Exercises - Lower Extremity Short Arc Quad: AROM, Strengthening, Right, 10 reps Heel Slides: AROM, Strengthening, Right, 10 reps Hip ABduction/ADduction: AROM, Strengthening, 10 reps (hip adduction pillow squeezes) Hip Flexion/Marching: AROM, Strengthening, Right, 5 reps, Standing (BUE support on RW)    General  Comments General comments (skin integrity, edema, etc.): pt on room air with SpO2 as low as 84% but pt recovering quickly, hovers 88-90% & nurse aware, pt denies SOB & respiratory therapist coming soon to administer breathing tx      Pertinent Vitals/Pain Pain Assessment Pain Assessment: Faces Faces Pain Scale: Hurts little more Pain Location: lateral aspect of R ankle Pain Descriptors / Indicators: Discomfort Pain Intervention(s): Monitored during session, Limited activity within patient's tolerance, Premedicated before session, Repositioned    Home Living                          Prior Function            PT Goals (current goals can now be found in the care plan section) Acute Rehab PT Goals Patient Stated Goal: get better PT Goal Formulation: With patient Time For Goal Achievement: 03/27/22 Potential to Achieve Goals: Good Progress towards PT goals: Progressing toward goals    Frequency    7X/week      PT Plan Frequency needs to be updated    Co-evaluation              AM-PAC PT "6 Clicks" Mobility   Outcome Measure  Help needed turning from your back to your side while in a flat bed without using bedrails?: None Help needed moving from lying on your back to sitting on the side of a flat bed without using bedrails?: A Little Help needed moving to and from a bed to a chair (including a wheelchair)?: A Lot Help needed standing up from a chair using your arms (e.g., wheelchair or bedside chair)?: A Little Help needed to walk in hospital room?: Total Help needed climbing 3-5 steps with a railing? : Total 6 Click Score: 14    End of Session   Activity Tolerance: Patient tolerated treatment well Patient left: in chair;with call bell/phone within reach Nurse Communication: Mobility status;Patient requests pain meds (O2 during session) PT Visit Diagnosis: Unsteadiness on feet (R26.81);Muscle weakness (generalized) (M62.81);Difficulty in walking, not  elsewhere classified (R26.2);Other abnormalities of gait and mobility (R26.89);Pain Pain - Right/Left: Right Pain - part of body: Ankle and joints of foot     Time: 6503-5465 PT Time Calculation (min) (ACUTE ONLY): 13 min  Charges:  $Therapeutic Activity: 8-22 mins                     Meghan [name], PT, DPT 03/14/22, 9:56 AM   Meghan [name] 03/14/2022, 9:55 AM

## 2022-03-14 NOTE — TOC Initial Note (Signed)
Transition of Care [facility]) - Initial/Assessment Note    Patient Details  Name: Meghan [name] MRN: [id number] Date of Birth: 16-Sep-1945  [facility] CM/SW Contact:    Meghan Masson, RN Phone [phone] 03/14/2022, 2:03 PM  Clinical Narrative:                 RN attempted to speak with pt however pt sleeping in her recliner in the room. RN spoke with closest relative Meghan [name] (cousin) concerning pt returning back to [facility]. Ms. [place] Crane has indicated she can transport pt back to the facility by private vehicle if needed. Pt is currently a residence at [facility]. TOC RN attempted to speak with Meghan [name] at [facility] concerning pt's return however only able to leave a HIPAA message requesting a call back. Will reach out once again tomorrow if no call back concerning plans for discharge tomorrow to return to the facility however SNF level of care has been recommended.  TOC remains available to assist with discharging planning needs.    Barriers to Discharge: Continued Medical Work up   Patient Goals and CMS Choice   CMS Medicare.gov Compare Post Acute Care list provided to:: Patient Choice offered to / list presented to :  (Meghan [name](Cousin))  Expected Discharge Plan and Services                                                Prior Living Arrangements/Services                       Activities of Daily Living Home Assistive Devices/Equipment: Gilford Rile (specify type) ADL Screening (condition at time of admission) Patient's cognitive ability adequate to safely complete daily activities?: No Is the patient deaf or have difficulty hearing?: No Does the patient have difficulty seeing, even when wearing glasses/contacts?: No Does the patient have difficulty concentrating, remembering, or making decisions?: No Patient able to express need for assistance with ADLs?: No Does the patient have difficulty dressing or bathing?: No Independently performs ADLs?: Yes  (appropriate for developmental age) Does the patient have difficulty walking or climbing stairs?: Yes Weakness of Legs: Right Weakness of Arms/Hands: None  Permission Sought/Granted                  Emotional Assessment              Admission diagnosis:  Fall [W19.XXXA] Closed fracture of right ankle, initial encounter [S82.891A] Minor head injury, initial encounter [F02.63ZC] Fall, initial encounter [W19.XXXA] Patient Active Problem List   Diagnosis Date Noted   Fall 03/11/2022   Ankle fracture, right 03/11/2022   Pancytopenia ([place]) 03/11/2022   Acute on chronic diastolic CHF (congestive heart failure) ([place])    AMS (altered mental status)    Vitamin D insufficiency 01/12/2022   Lumbar facet joint pain (Bilateral) 01/12/2022   Lumbosacral facet syndrome 01/12/2022   Lumbar degenerative disc disease 11/12/2021   Chronic pain syndrome 11/12/2021   Pharmacologic therapy 11/12/2021   Disorder of skeletal system 11/12/2021   Problems influencing health status 11/12/2021   Chronic hip pain (2ry area of Pain) (Bilateral) (R>L) 11/12/2021   Rheumatoid arthritis involving multiple sites with positive rheumatoid factor ([place]) 11/12/2021   Chronic use of opiate for therapeutic purpose 11/12/2021   Chronic low back pain (1ry area of Pain) (Bilateral) (R>L) w/o sciatica  03/20/2020   Lumbar stenosis with neurogenic claudication 03/20/2020   Dyspnea 11/08/2018   Complete heart block (HCC) 10/21/2018   Closed fracture of distal end of right femur ([place]), periprosthetic 10/21/2018   Palliative care encounter 09/01/2018   Weakness generalized 09/01/2018   Shortness of breath 09/01/2018   Syncope 05/05/2017   Acute on chronic systolic CHF (congestive heart failure) ([place]) 04/16/2017   Acute lower UTI 04/16/2017   Acute respiratory failure ([place]) 04/16/2017   Pressure injury of skin 03/22/2017   Acute on chronic respiratory failure with hypoxia ([place]) 03/20/2017   Acute respiratory  failure with hypoxia (HCC) 03/20/2017   Near syncope 02/27/2017   Dehydration 02/27/2017   Colitis 02/22/2017   Seizure ([place]) 01/03/2016   Chronic tension-type headache, intractable 12/04/2015   Olfactory hallucination 12/04/2015   Degeneration of intervertebral disc of lumbar region 07/15/2015   Seropositive rheumatoid arthritis ([place]) 07/15/2015   Asthma with acute exacerbation 03/10/2015   Hypokalemia 03/01/2015   Hyponatremia 03/01/2015   DDD (degenerative disc disease), lumbar 01/31/2015   Arthritis, degenerative 01/31/2015   HTN (hypertension) 12/24/2014   Sepsis ([place]) 12/24/2014   Left knee pain 12/24/2014   GERD (gastroesophageal reflux disease) 12/24/2014   COPD (chronic obstructive pulmonary disease) ([place]) 12/24/2014   Depression 12/24/2014   Anxiety 12/24/2014   Severe bipolar disorder with psychotic features, mood-congruent ([place]) 11/16/2014   Neurosis, posttraumatic 11/16/2014   H/O gastric ulcer 11/16/2014   Barton's fracture of distal radius, closed 09/19/2014   Neuritis or radiculitis due to rupture of lumbar intervertebral disc 05/11/2014   Cervico-occipital neuralgia 03/26/2014   Difficulty in walking 03/26/2014   Difficulty in walking, not elsewhere classified 03/26/2014   Cervical spine syndrome 01/26/2014   Cephalalgia 01/09/2014   Disordered sleep 01/09/2014   BP (high blood pressure) 10/19/2013   Adiposity 10/19/2013   Cardiac murmur 10/19/2013   Breath shortness 10/19/2013   Chronic obstructive pulmonary disease ([place]) 10/19/2013   Diabetes mellitus ([place]) 10/19/2013   PCP:  Garwin Brothers, MD Pharmacy:   [facility] [address] [place] [address] Phone: [phone] Fax: [facility] [place], [place] - [address] [place] [address] Phone: [phone] Fax: [phone]     Social Determinants of Health (SDOH) Interventions    Readmission  Risk Interventions     No data to display

## 2022-03-14 NOTE — Progress Notes (Signed)
PROGRESS NOTE    Meghan [name]  ZDG:[id number] DOB: 07-08-1946 DOA: 03/11/2022 PCP: Garwin Brothers, MD    Brief Narrative:  Meghan [name] is a 76 y.o. female with medical history significant for rheumatoid arthritis, COPD, coronary artery disease, hypertension, dyslipidemia, status post pacemaker insertion for complete heart block who presented to the ER via EMS for evaluation following a mechanical fall. Most of the history was obtained from the chart as patient is very lethargic and unable to provide any history. Patient was said to have gotten up to use the bathroom and fell.  There was no loss of consciousness and she denied feeling dizzy or lightheaded prior to the fall.  She noted severe pain in her right ankle as well as right hip following the fall and was found to have a hematoma. She was brought into the ER and was noted to have severe deformity involving her right ankle but was able to move her toes. Patient had received IV fentanyl for pain control by EMS, she received IV morphine in the ER and was said to have taken some of her oxycodone at home for pain control. Patient's ankle fracture was reduced in the ER and orthopedic surgery was consulted for further evaluation. During my evaluation she is noted to be very lethargic but arouses to verbal stimuli and is unable to stay awake to provide any history.   8/3 awake, responding appropriately. Denies sob or cp 8/4 post op had hypotension and required bipap. Was given ivf bolus.this am on  35% 02 8/5 feels better, no sob.    Consultants:  Cardiology, orthopedics, PCCM  Procedures:   Antimicrobials:      Subjective: No sob, dizziness, or any other sx. Working with PT  Objective: Vitals:   03/14/22 0829 03/14/22 0900 03/14/22 1052 03/14/22 1113  BP: 125/71 (!) 121/55 (!) 110/57 113/60  Pulse: (!) 104 88 (!) 105 (!) 108  Resp: (!) 23 20 (!) 24 16  Temp:    98.9 F (37.2 C)  TempSrc:      SpO2: 94% 93% 97% 93%  Weight:       Height:        Intake/Output Summary (Last 24 hours) at 03/14/2022 1201 Last data filed at 03/14/2022 0700 Gross per 24 hour  Intake --  Output 1650 ml  Net -1650 ml   Filed Weights   03/12/22 1316  Weight: 65.3 kg    Examination: Calm, NAD Cta no w/r Reg s1/s2 no gallop +harsh 2/6 sm Soft benign +bs No edema Aaoxox3  Mood and affect appropriate in current setting   Data Reviewed: I have personally reviewed following labs and imaging studies  CBC: Recent Labs  Lab 03/11/22 0509 03/12/22 0439 03/13/22 0450 03/14/22 0515  WBC 3.4* 3.2* 3.3*  --   NEUTROABS 2.3  --   --   --   HGB 11.6* 11.1* 8.7* 7.6*  HCT 35.0* 33.1* 26.4* 23.3*  MCV 95.9 95.4 95.0  --   PLT 126* 105* 94* 88*   Basic Metabolic Panel: Recent Labs  Lab 03/11/22 0509 03/11/22 1246 03/12/22 0439 03/13/22 0450 03/14/22 0515  NA 145  --  141 141 143  K 3.0*  --  3.0* 4.3 3.2*  CL 110  --  104 108 108  CO2 27  --  '28 28 29  '$ GLUCOSE 97  --  73 135* 88  BUN 13  --  '13 13 19  '$ CREATININE 0.81  --  0.82 0.81 0.67  CALCIUM 8.0*  --  8.3* 8.3* 8.5*  MG 1.9 2.0  --   --   --   PHOS 3.5  --   --   --   --    GFR: Estimated Creatinine Clearance: 43.9 mL/min (by C-G formula based on SCr of 0.67 mg/dL). Liver Function Tests: Recent Labs  Lab 03/11/22 0509  AST 18  ALT 14  ALKPHOS 42  BILITOT 0.8  PROT 5.7*  ALBUMIN 3.4*   No results for input(s): "LIPASE", "AMYLASE" in the last 168 hours. No results for input(s): "AMMONIA" in the last 168 hours. Coagulation Profile: Recent Labs  Lab 03/11/22 0509  INR 1.1   Cardiac Enzymes: No results for input(s): "CKTOTAL", "CKMB", "CKMBINDEX", "TROPONINI" in the last 168 hours. BNP (last 3 results) No results for input(s): "PROBNP" in the last 8760 hours. HbA1C: No results for input(s): "HGBA1C" in the last 72 hours.  CBG: Recent Labs  Lab 03/13/22 1935 03/13/22 2323 03/14/22 0316 03/14/22 0804 03/14/22 1148  GLUCAP 81 110* 94 80 157*    Lipid Profile: No results for input(s): "CHOL", "HDL", "LDLCALC", "TRIG", "CHOLHDL", "LDLDIRECT" in the last 72 hours. Thyroid Function Tests: No results for input(s): "TSH", "T4TOTAL", "FREET4", "T3FREE", "THYROIDAB" in the last 72 hours.  Anemia Panel: No results for input(s): "VITAMINB12", "FOLATE", "FERRITIN", "TIBC", "IRON", "RETICCTPCT" in the last 72 hours. Sepsis Labs: Recent Labs  Lab 03/11/22 0509  PROCALCITON <0.10    Recent Results (from the past 240 hour(s))  MRSA Next Gen by PCR, Nasal     Status: Abnormal   Collection Time: 03/12/22  7:35 PM   Specimen: Nasal Mucosa; Nasal Swab  Result Value Ref Range Status   MRSA by PCR Next Gen DETECTED (A) NOT DETECTED Final    Comment: RESULT CALLED TO, READ BACK BY AND VERIFIED WITH: SAVANNAH RIVERS '@2057'$  ON 03/12/22 SKL (NOTE) The GeneXpert MRSA Assay (FDA approved for NASAL specimens only), is one component of a comprehensive MRSA colonization surveillance program. It is not intended to diagnose MRSA infection nor to guide or monitor treatment for MRSA infections. Test performance is not FDA approved in patients less than 68 years old. Performed at [facility], [address], [place], [place] [address]          Radiology Studies: DG Ankle Complete Right  Result Date: 03/12/2022 CLINICAL DATA:  Postoperative evaluation. EXAM: RIGHT ANKLE - COMPLETE 3+ VIEW COMPARISON:  March 12, 2022 FINDINGS: The right ankle was imaged in a fiberglass cast with subsequently obscured osseous and soft tissue detail. Large radiopaque fixation plates and screws are seen along the distal right tibia and distal right fibula. Acute fracture deformities of the distal shafts of the right tibia and right fibula are also seen, with anatomic alignment. There is no evidence of dislocation. There is no evidence of arthropathy or other focal bone abnormality. Diffuse soft tissue swelling is noted with multiple radiopaque skin staples.  IMPRESSION: 1. Status post open reduction and internal fixation of the distal right tibia and distal right fibula. Electronically Signed   By: Virgina Norfolk M.D.   On: 03/12/2022 19:14   DG Tibia/Fibula Right  Result Date: 03/12/2022 CLINICAL DATA:  Right distal tibia and fibula ORIF. EXAM: RIGHT TIBIA AND FIBULA - 2 VIEW COMPARISON:  Right ankle x-rays from yesterday. FLUOROSCOPY TIME:  The device does not provide the exposure index. Fluoroscopy Time: 2 minutes, 20. Number of Acquired Images: 10 C-arm fluoroscopic images were obtained intraoperatively and submitted for post operative  interpretation. FINDINGS: Multiple intraoperative fluoroscopic images demonstrate interval medial plate and screw fixation of the distal tibia diaphyseal fracture and lateral plate and screw fixation of the distal fibula diaphyseal fracture. Alignment is anatomic. IMPRESSION: 1. Intraoperative fluoroscopic guidance for distal tibia and fibula ORIF. Electronically Signed   By: Titus Dubin M.D.   On: 03/12/2022 17:30   DG C-Arm 1-60 Min-No Report  Result Date: 03/12/2022 Fluoroscopy was utilized by the requesting physician.  No radiographic interpretation.   DG C-Arm 1-60 Min-No Report  Result Date: 03/12/2022 Fluoroscopy was utilized by the requesting physician.  No radiographic interpretation.   DG C-Arm 1-60 Min-No Report  Result Date: 03/12/2022 Fluoroscopy was utilized by the requesting physician.  No radiographic interpretation.   DG C-Arm 1-60 Min-No Report  Result Date: 03/12/2022 Fluoroscopy was utilized by the requesting physician.  No radiographic interpretation.        Scheduled Meds:  budesonide  2 mL Inhalation Daily   busPIRone  10 mg Oral BID   Chlorhexidine Gluconate Cloth  6 each Topical Q0600   cholecalciferol  1,000 Units Oral Q breakfast   cyanocobalamin  500 mcg Oral Daily   docusate sodium  100 mg Oral BID   enoxaparin (LOVENOX) injection  40 mg Subcutaneous Q24H   escitalopram   20 mg Oral Daily   famotidine  20 mg Oral Daily   folic acid  1 mg Oral Daily   furosemide  20 mg Intravenous Q12H   insulin aspart  0-15 Units Subcutaneous Q4H   lamoTRIgine  100 mg Oral Daily   midodrine  5 mg Oral TID WC   pramipexole  0.25 mg Oral Daily   pravastatin  20 mg Oral Daily   pregabalin  100 mg Oral BID   senna  1 tablet Oral BID   Tofacitinib Citrate  1 tablet Oral BID   topiramate  25 mg Oral QHS   Continuous Infusions:    Assessment & Plan:   Principal Problem:   Fall Active Problems:   AMS (altered mental status)   Seropositive rheumatoid arthritis ([place])   Acute on chronic diastolic CHF (congestive heart failure) (HCC)   Seizure (HCC)   Ankle fracture, right   HTN (hypertension)   GERD (gastroesophageal reflux disease)   Hypokalemia   Diabetes mellitus ([place])   Depression   Pancytopenia (HCC)   Chronic obstructive pulmonary disease ([place])  Fall Status post mechanical fall with a right ankle fracture Place patient on fall precautions 8/5 PT/OT rec. snf   AMS (altered mental status) Acute metabolic encephalopathy Patient noted to be very lethargic during my assessment, will open eyes to loud verbal stimuli but unable to stay awake enough to provide any history. Mental status changes appear to be secondary to opioids that patient received for pain control 8/5 at baseline    Acute on chronic diastolic CHF (congestive heart failure) (HCC) Noted to have cardiomegaly and evidence of pulmonary vascular congestion on chest x-ray 2D echocardiogram from 05/23 shows an LVEF of about 55% with normal RV systolic function and severe valvular regurgitation. Continue diuretic therapy Hold lisinopril due to relative hypotension 8/3 treated with IV Lasix.  Clinically improving Cardiology was consulted input appreciated Echo with normal EF 8/4 received IV fluid boluses and IV fluid.  Due to hypotension On midodrine BP better, will DC IV fluids 8/5 euvolemic  on exam BNP down    Hypokalemia Replace with KCl   Seropositive rheumatoid arthritis (HCC) Stable  continue weekly methotrexate and tofacitinib  Ankle fracture, right Status post mechanical fall with a right ankle fracture Imaging shows an acute, comminuted fracture deformities involve the distal diaphysis of the fibula and tibia. Lateral angulation of the distal fracture fragments. Soft tissue swelling. Patient is status post reduction in the ER and orthopedic surgery has been consulted for surgical repair Pain control Muscle relaxants Immobilize right lower extremity 8/3 Per cardiology patient is mild to moderate risk from cardiac standpoint   Seizure ([place]) Stable Place patient on seizure precautions Continue Lamictal   HTN (hypertension) Bp on low side after diuretics Hold lisinopril   GERD (gastroesophageal reflux disease) Stable Continue Protonix   Diabetes mellitus ([place]) Hold oral hypoglycemic agents Check blood sugars every 4 hours while patient is n.p.o.      Pancytopenia ([place]) Most likely secondary to chronic illness Monitor closely during this hospitalization   Depression Stable Resume buspirone, mirtazapine and trazodone once able   Chronic obstructive pulmonary disease (HCC) Stable and not acutely exacerbated Continue inhaled steroids and as needed bronchodilator therapy     DVT prophylaxis: scd,lovenox Code Status:full Family Communication: none at bedisde Disposition Plan: SNF Status is: Inpatient Remains inpatient appropriate because: IV treatment unsafe discharge, SNF pending      LOS: 3 days   Time spent: 35 min    Nolberto Hanlon, MD [facility] Pager [phone]  If 7PM-7AM, please contact night-coverage 03/14/2022, 12:01 PM

## 2022-03-14 NOTE — TOC Progression Note (Signed)
Transition of Care [facility]) - Progression Note    Patient Details  Name: Meghan [name] MRN: [id number] Date of Birth: 03/20/1946  Transition of Care [facility]) CM/SW Contact  Zigmund Daniel Dorian Pod, RN Phone Number:[phone] 03/14/2022, 4:40 PM  Clinical Narrative:    RN spoke with Lavella Lemons [facility]) concerning the return of pt for tomorrow. Pt will be able to return however discharge summary needs to be sent to [facility] prior to discharge and RN case manager will contact this representative when pt is ready to be discharged. Confirmed pt will return to room 71 B and receive SNF level of care at [facility].  Floor nurse will call report to [phone] via [employer]. Again via private vehicle from [place].  TOC will follow up tomorrow with pt's discharge disposition.     Barriers to Discharge: Continued Medical Work up  Expected Discharge Plan and Services                                                 Social Determinants of Health (SDOH) Interventions    Readmission Risk Interventions     No data to display

## 2022-03-14 NOTE — Progress Notes (Signed)
Physical Therapy Treatment Patient Details Name: Meghan [name] MRN: [id number] DOB: 1946-06-15 Today's Date: 03/14/2022   History of Present Illness Pt is a 76 y/o F admitted on 03/11/22 after presenting to the hospital for evaluation s/p fall. Pt noted to have R ankel fx & AMS. Pt underwent R ORIF on 03/12/22 by Dr. Mack Guise. Pt was noted to be hypotensive postoperatively. PMH: RA, COPD, CAD, HTN, dyslipidemia, s/p pacemaker insertion    PT Comments    Pt seen for PT tx with pt asleep but easily awakened & agreeable to tx. Nurse administered pain meds during session as pt with c/o RLE pain. Pt engaged in RLE strengthening exercises with cuing for technique & transfers supine>sit with supervision. Pt transfers STS & stand pivot with RW & min/mod assist with poor awareness of safety during transfers with PT providing education/cuing. Will continue to follow pt acutely to address balance, transfers, and gait with LRAD.     Recommendations for follow up therapy are one component of a multi-disciplinary discharge planning process, led by the attending physician.  Recommendations may be updated based on patient status, additional functional criteria and insurance authorization.  Follow Up Recommendations  Skilled nursing-short term rehab (<3 hours/day) Can patient physically be transported by private vehicle: No   Assistance Recommended at Discharge Frequent or constant Supervision/Assistance  Patient can return home with the following A lot of help with walking and/or transfers;A lot of help with bathing/dressing/bathroom;Assist for transportation;Assistance with cooking/housework;Help with stairs or ramp for entrance   Equipment Recommendations  None recommended by PT    Recommendations for Other Services       Precautions / Restrictions Precautions Precautions: Fall Required Braces or Orthoses: Splint/Cast Splint/Cast: splint/cast on R LE Restrictions Weight Bearing Restrictions: Yes RLE  Weight Bearing: Non weight bearing     Mobility  Bed Mobility Overal bed mobility: Needs Assistance Bed Mobility: Supine to Sit     Supine to sit: Supervision (HOB only slightly elevated, use of bed rails)          Transfers Overall transfer level: Needs assistance Equipment used: Rolling walker (2 wheels) Transfers: Sit to/from Stand Sit to Stand: Min assist Stand pivot transfers: Min assist, Mod assist         General transfer comment: Pt continues to demonstrate impaired awareness of safe hand placement as pt pulls to stand on RW, PT provides min educational cuing for NWB RLE. Pt completes STS from elevated EOB with light min assist & min/mod assist for stand pivot to recliner with RW. During stand pivot pt solely stands & pivots her buttocks but does not pivot L foot along floor to scoot it closer to recliner despite PT attempting to cue pt to do so. Afterwards PT provides education & demo regarding scooting L foot across floor to ensure she's close enough to chair before sitting with pt stating "that's hard to do with all this weight on my R foot".    Ambulation/Gait                   Stairs             Wheelchair Mobility    Modified Rankin (Stroke Patients Only)       Balance Overall balance assessment: Needs assistance Sitting-balance support: Feet supported Sitting balance-Leahy Scale: Good     Standing balance support: During functional activity, Bilateral upper extremity supported, Reliant on assistive device for balance Standing balance-Leahy Scale: Fair  Cognition Arousal/Alertness: Awake/alert Behavior During Therapy: WFL for tasks assessed/performed Overall Cognitive Status: Within Functional Limits for tasks assessed                                          Exercises Total Joint Exercises Hip ABduction/ADduction: AROM, Strengthening, Right, 10 reps, Supine Straight Leg  Raises: AAROM, AROM, Strengthening, Right, 10 reps, Supine Long Arc Quad: AROM, Strengthening, Right, 10 reps, Seated    General Comments General comments (skin integrity, edema, etc.): pt on room air with SpO2 as low as 84% but pt recovering quickly, hovers 88-90% & nurse aware, pt denies SOB & respiratory therapist coming soon to administer breathing tx      Pertinent Vitals/Pain Pain Assessment Pain Assessment: Faces Faces Pain Scale: Hurts even more ("my foot is killing me") Pain Location: RLE Pain Descriptors / Indicators: Discomfort Pain Intervention(s): RN gave pain meds during session, Monitored during session, Repositioned, Limited activity within patient's tolerance    Home Living                          Prior Function            PT Goals (current goals can now be found in the care plan section) Acute Rehab PT Goals Patient Stated Goal: get better PT Goal Formulation: With patient Time For Goal Achievement: 03/27/22 Potential to Achieve Goals: Good Progress towards PT goals: Progressing toward goals    Frequency    BID      PT Plan Current plan remains appropriate    Co-evaluation              AM-PAC PT "6 Clicks" Mobility   Outcome Measure  Help needed turning from your back to your side while in a flat bed without using bedrails?: None Help needed moving from lying on your back to sitting on the side of a flat bed without using bedrails?: A Little Help needed moving to and from a bed to a chair (including a wheelchair)?: A Lot Help needed standing up from a chair using your arms (e.g., wheelchair or bedside chair)?: A Little Help needed to walk in hospital room?: Total Help needed climbing 3-5 steps with a railing? : Total 6 Click Score: 14    End of Session   Activity Tolerance: Patient tolerated treatment well;Patient limited by fatigue Patient left: in chair;with call bell/phone within reach Nurse Communication: Mobility  status;Patient requests pain meds (O2 during session) PT Visit Diagnosis: Unsteadiness on feet (R26.81);Muscle weakness (generalized) (M62.81);Difficulty in walking, not elsewhere classified (R26.2);Other abnormalities of gait and mobility (R26.89)     Time: 7017-7939 PT Time Calculation (min) (ACUTE ONLY): 11 min  Charges:  $Therapeutic Activity: 8-22 mins                     Lavone Nian, PT, DPT 03/14/22, 7:52 AM   Waunita Schooner 03/14/2022, 7:51 AM

## 2022-03-14 NOTE — Progress Notes (Signed)
Subjective:  POD #2 s/p ORIF right tibia and fibula fractures.   Patient has been transferred to the floor.  She is sitting upright in her bedside chair resting relatively comfortably.   Objective:   VITALS:   Vitals:   03/14/22 0829 03/14/22 0900 03/14/22 1052 03/14/22 1113  BP: 125/71 (!) 121/55 (!) 110/57 113/60  Pulse: (!) 104 88 (!) 105 (!) 108  Resp: (!) 23 20 (!) 24 16  Temp:    98.9 F (37.2 C)  TempSrc:      SpO2: 94% 93% 97% 93%  Weight:      Height:        PHYSICAL EXAM: Right lower extremity: AO splint in place.  Clean and dry. Neurovascular intact Slight hyperesthesias with light touch of toes Toes well perfused Patient can flex and extend her toes. dressing C/D/I Compartment soft  LABS  Results for orders placed or performed during the hospital encounter of 03/11/22 (from the past 24 hour(s))  Glucose, capillary     Status: Abnormal   Collection Time: 03/13/22  3:52 PM  Result Value Ref Range   Glucose-Capillary 141 (H) 70 - 99 mg/dL  Glucose, capillary     Status: None   Collection Time: 03/13/22  7:35 PM  Result Value Ref Range   Glucose-Capillary 81 70 - 99 mg/dL  Glucose, capillary     Status: Abnormal   Collection Time: 03/13/22 11:23 PM  Result Value Ref Range   Glucose-Capillary 110 (H) 70 - 99 mg/dL  Glucose, capillary     Status: None   Collection Time: 03/14/22  3:16 AM  Result Value Ref Range   Glucose-Capillary 94 70 - 99 mg/dL  Basic metabolic panel     Status: Abnormal   Collection Time: 03/14/22  5:15 AM  Result Value Ref Range   Sodium 143 135 - 145 mmol/L   Potassium 3.2 (L) 3.5 - 5.1 mmol/L   Chloride 108 98 - 111 mmol/L   CO2 29 22 - 32 mmol/L   Glucose, Bld 88 70 - 99 mg/dL   BUN 19 8 - 23 mg/dL   Creatinine, Ser 0.67 0.44 - 1.00 mg/dL   Calcium 8.5 (L) 8.9 - 10.3 mg/dL   GFR, Estimated >60 >60 mL/min   Anion gap 6 5 - 15  Platelet count     Status: Abnormal   Collection Time: 03/14/22  5:15 AM  Result Value Ref Range    Platelets 88 (L) 150 - 400 K/uL  Hemoglobin and hematocrit, blood     Status: Abnormal   Collection Time: 03/14/22  5:15 AM  Result Value Ref Range   Hemoglobin 7.6 (L) 12.0 - 15.0 g/dL   HCT 23.3 (L) 36.0 - 46.0 %  Glucose, capillary     Status: None   Collection Time: 03/14/22  8:04 AM  Result Value Ref Range   Glucose-Capillary 80 70 - 99 mg/dL    DG Ankle Complete Right  Result Date: 03/12/2022 CLINICAL DATA:  Postoperative evaluation. EXAM: RIGHT ANKLE - COMPLETE 3+ VIEW COMPARISON:  March 12, 2022 FINDINGS: The right ankle was imaged in a fiberglass cast with subsequently obscured osseous and soft tissue detail. Large radiopaque fixation plates and screws are seen along the distal right tibia and distal right fibula. Acute fracture deformities of the distal shafts of the right tibia and right fibula are also seen, with anatomic alignment. There is no evidence of dislocation. There is no evidence of arthropathy or other focal bone abnormality. Diffuse  soft tissue swelling is noted with multiple radiopaque skin staples. IMPRESSION: 1. Status post open reduction and internal fixation of the distal right tibia and distal right fibula. Electronically Signed   By: Virgina Norfolk M.D.   On: 03/12/2022 19:14   DG Tibia/Fibula Right  Result Date: 03/12/2022 CLINICAL DATA:  Right distal tibia and fibula ORIF. EXAM: RIGHT TIBIA AND FIBULA - 2 VIEW COMPARISON:  Right ankle x-rays from yesterday. FLUOROSCOPY TIME:  The device does not provide the exposure index. Fluoroscopy Time: 2 minutes, 20. Number of Acquired Images: 10 C-arm fluoroscopic images were obtained intraoperatively and submitted for post operative interpretation. FINDINGS: Multiple intraoperative fluoroscopic images demonstrate interval medial plate and screw fixation of the distal tibia diaphyseal fracture and lateral plate and screw fixation of the distal fibula diaphyseal fracture. Alignment is anatomic. IMPRESSION: 1. Intraoperative  fluoroscopic guidance for distal tibia and fibula ORIF. Electronically Signed   By: Titus Dubin M.D.   On: 03/12/2022 17:30   DG C-Arm 1-60 Min-No Report  Result Date: 03/12/2022 Fluoroscopy was utilized by the requesting physician.  No radiographic interpretation.   DG C-Arm 1-60 Min-No Report  Result Date: 03/12/2022 Fluoroscopy was utilized by the requesting physician.  No radiographic interpretation.   DG C-Arm 1-60 Min-No Report  Result Date: 03/12/2022 Fluoroscopy was utilized by the requesting physician.  No radiographic interpretation.   DG C-Arm 1-60 Min-No Report  Result Date: 03/12/2022 Fluoroscopy was utilized by the requesting physician.  No radiographic interpretation.    Assessment/Plan: 2 Days Post-Op   Principal Problem:   Fall Active Problems:   HTN (hypertension)   GERD (gastroesophageal reflux disease)   Depression   Hypokalemia   Chronic obstructive pulmonary disease (HCC)   Diabetes mellitus (HCC)   Seropositive rheumatoid arthritis (HCC)   Seizure (HCC)   Ankle fracture, right   Acute on chronic diastolic CHF (congestive heart failure) (HCC)   AMS (altered mental status)   Pancytopenia (HCC)  -PT/OT: Nonweightbearing x 6 to 8 weeks postop -Continue Lovenox for DVT prophylaxis -Keep AO splint in place, clean and dry until follow-up -Patient will need wheelchair with right leg support as well as bedside commode after discharge -Anticipate discharge to [facility] -Follow-up with Dr. Mack Guise outpatient in 2 weeks     Renee Harder , MD 03/14/2022, 11:45 AM

## 2022-03-15 ENCOUNTER — Inpatient Hospital Stay: Payer: Medicare Other

## 2022-03-15 DIAGNOSIS — E119 Type 2 diabetes mellitus without complications: Secondary | ICD-10-CM | POA: Diagnosis not present

## 2022-03-15 DIAGNOSIS — R509 Fever, unspecified: Secondary | ICD-10-CM

## 2022-03-15 DIAGNOSIS — W19XXXD Unspecified fall, subsequent encounter: Secondary | ICD-10-CM | POA: Diagnosis not present

## 2022-03-15 DIAGNOSIS — R400 Somnolence: Secondary | ICD-10-CM | POA: Diagnosis not present

## 2022-03-15 DIAGNOSIS — I5033 Acute on chronic diastolic (congestive) heart failure: Secondary | ICD-10-CM | POA: Diagnosis not present

## 2022-03-15 LAB — CBC
HCT: 27.3 % — ABNORMAL LOW (ref 36.0–46.0)
Hemoglobin: 8.9 g/dL — ABNORMAL LOW (ref 12.0–15.0)
MCH: 31.6 pg (ref 26.0–34.0)
MCHC: 32.6 g/dL (ref 30.0–36.0)
MCV: 96.8 fL (ref 80.0–100.0)
Platelets: 125 10*3/uL — ABNORMAL LOW (ref 150–400)
RBC: 2.82 MIL/uL — ABNORMAL LOW (ref 3.87–5.11)
RDW: 13.9 % (ref 11.5–15.5)
WBC: 3.5 10*3/uL — ABNORMAL LOW (ref 4.0–10.5)
nRBC: 0 % (ref 0.0–0.2)

## 2022-03-15 LAB — GLUCOSE, CAPILLARY
Glucose-Capillary: 119 mg/dL — ABNORMAL HIGH (ref 70–99)
Glucose-Capillary: 128 mg/dL — ABNORMAL HIGH (ref 70–99)
Glucose-Capillary: 141 mg/dL — ABNORMAL HIGH (ref 70–99)
Glucose-Capillary: 85 mg/dL (ref 70–99)
Glucose-Capillary: 91 mg/dL (ref 70–99)
Glucose-Capillary: 93 mg/dL (ref 70–99)
Glucose-Capillary: 96 mg/dL (ref 70–99)

## 2022-03-15 LAB — POTASSIUM: Potassium: 3.2 mmol/L — ABNORMAL LOW (ref 3.5–5.1)

## 2022-03-15 MED ORDER — POTASSIUM CHLORIDE CRYS ER 20 MEQ PO TBCR
40.0000 meq | EXTENDED_RELEASE_TABLET | Freq: Every day | ORAL | Status: DC
  Administered 2022-03-15: 40 meq via ORAL
  Filled 2022-03-15: qty 2

## 2022-03-15 MED ORDER — ACETAMINOPHEN 500 MG PO TABS: 1000.0000 mg | ORAL_TABLET | Freq: Four times a day (QID) | ORAL | 0 refills | Status: DC | PRN

## 2022-03-15 NOTE — Progress Notes (Signed)
Physical Therapy Treatment Patient Details Name: Meghan [name] MRN: [id number] DOB: [birth year] Today's Date: 03/15/2022   History of Present Illness Pt is a 76 y/o F admitted on 03/11/22 after presenting to the hospital for evaluation s/p fall. Pt noted to have R ankel fx & AMS. Pt underwent R ORIF on 03/12/22 by Dr. Mack Guise. Pt was noted to be hypotensive postoperatively. PMH: RA, COPD, CAD, HTN, dyslipidemia, s/p pacemaker insertion    PT Comments    Pt with linens wet.  Purwic not managing urine.  Pt stated she was aware but did not alert staff.  Education given as full bed change is necessary.  To EOB with min a x 1.  Generally unsteady in sitting EOB today with several Post LOB's.  Stood with RW and min a x 2 to stand and mod a x 1 to remain standing while linens were changed.  Discharge plan to return to SNF unsure at this time but she is returned to supine for possible transfer back to SNF today.   Recommendations for follow up therapy are one component of a multi-disciplinary discharge planning process, led by the attending physician.  Recommendations may be updated based on patient status, additional functional criteria and insurance authorization.  Follow Up Recommendations  Skilled nursing-short term rehab (<3 hours/day)     Assistance Recommended at Discharge Frequent or constant Supervision/Assistance  Patient can return home with the following A lot of help with walking and/or transfers;A lot of help with bathing/dressing/bathroom;Assist for transportation;Assistance with cooking/housework;Help with stairs or ramp for entrance   Equipment Recommendations  None recommended by PT    Recommendations for Other Services       Precautions / Restrictions Precautions Precautions: Fall Required Braces or Orthoses: Splint/Cast Splint/Cast: splint/cast on R LE Restrictions Weight Bearing Restrictions: Yes RLE Weight Bearing: Non weight bearing     Mobility  Bed Mobility Overal  bed mobility: Needs Assistance Bed Mobility: Supine to Sit     Supine to sit: Min assist Sit to supine: Min assist, +2 for physical assistance        Transfers Overall transfer level: Needs assistance Equipment used: Rolling walker (2 wheels) Transfers: Sit to/from Stand Sit to Stand: Min assist, +2 physical assistance                Ambulation/Gait                   Stairs             Wheelchair Mobility    Modified Rankin (Stroke Patients Only)       Balance Overall balance assessment: Needs assistance Sitting-balance support: Feet supported Sitting balance-Leahy Scale: Poor     Standing balance support: During functional activity, Bilateral upper extremity supported, Reliant on assistive device for balance Standing balance-Leahy Scale: Poor Standing balance comment: severl post LOB's today while EOB, unsteady in standing                            Cognition Arousal/Alertness: Awake/alert Behavior During Therapy: WFL for tasks assessed/performed Overall Cognitive Status: Within Functional Limits for tasks assessed                                          Exercises      General Comments        Pertinent  Vitals/Pain Pain Assessment Pain Assessment: Faces Faces Pain Scale: Hurts a little bit Pain Location: lateral aspect of R ankle Pain Descriptors / Indicators: Discomfort Pain Intervention(s): Limited activity within patient's tolerance, Monitored during session, Repositioned    Home Living                          Prior Function            PT Goals (current goals can now be found in the care plan section) Progress towards PT goals: Progressing toward goals    Frequency    7X/week      PT Plan Current plan remains appropriate    Co-evaluation              AM-PAC PT "6 Clicks" Mobility   Outcome Measure  Help needed turning from your back to your side while in a flat  bed without using bedrails?: None Help needed moving from lying on your back to sitting on the side of a flat bed without using bedrails?: A Little Help needed moving to and from a bed to a chair (including a wheelchair)?: A Lot Help needed standing up from a chair using your arms (e.g., wheelchair or bedside chair)?: A Lot Help needed to walk in hospital room?: Total Help needed climbing 3-5 steps with a railing? : Total 6 Click Score: 13    End of Session Equipment Utilized During Treatment: Gait belt Activity Tolerance: Patient tolerated treatment well Patient left: in bed;with call bell/phone within reach;with bed alarm set Nurse Communication: Mobility status PT Visit Diagnosis: Unsteadiness on feet (R26.81);Muscle weakness (generalized) (M62.81);Difficulty in walking, not elsewhere classified (R26.2);Other abnormalities of gait and mobility (R26.89);Pain Pain - Right/Left: Right Pain - part of body: Ankle and joints of foot     Time: 1315-1330 PT Time Calculation (min) (ACUTE ONLY): 15 min  Charges:  $Therapeutic Activity: 8-22 mins                   Chesley Noon, PTA 03/15/22, 1:39 PM

## 2022-03-15 NOTE — Progress Notes (Signed)
PROGRESS NOTE    Meghan [name]  CHE:[id number] DOB: [birth year] DOA: 03/11/2022 PCP: Garwin Brothers, MD    Brief Narrative:  Meghan [name] is a 76 y.o. female with medical history significant for rheumatoid arthritis, COPD, coronary artery disease, hypertension, dyslipidemia, status post pacemaker insertion for complete heart block who presented to the ER via EMS for evaluation following a mechanical fall. Most of the history was obtained from the chart as patient is very lethargic and unable to provide any history. Patient was said to have gotten up to use the bathroom and fell.  There was no loss of consciousness and she denied feeling dizzy or lightheaded prior to the fall.  She noted severe pain in her right ankle as well as right hip following the fall and was found to have a hematoma. She was brought into the ER and was noted to have severe deformity involving her right ankle but was able to move her toes. Patient had received IV fentanyl for pain control by EMS, she received IV morphine in the ER and was said to have taken some of her oxycodone at home for pain control. Patient's ankle fracture was reduced in the ER and orthopedic surgery was consulted for further evaluation. During my evaluation she is noted to be very lethargic but arouses to verbal stimuli and is unable to stay awake to provide any history.   8/3 awake, responding appropriately. Denies sob or cp 8/4 post op had hypotension and required bipap. Was given ivf bolus.this am on  35% 02 8/5 feels better, no sob.  8/6 febrile last night.    Consultants:  Cardiology, orthopedics, PCCM  Procedures:   Antimicrobials:      Subjective: Denies sob, cp, abd pain  Objective: Vitals:   03/15/22 0430 03/15/22 0830 03/15/22 0831 03/15/22 1200  BP: 128/70 (!) 156/85  (!) 101/55  Pulse: 97 (!) 106 (!) 105 93  Resp: '14 16 18 16  '$ Temp: 98.4 F (36.9 C) 99.5 F (37.5 C)  98.7 F (37.1 C)  TempSrc: Oral     SpO2: 93% 92% 91%  97%  Weight:      Height:        Intake/Output Summary (Last 24 hours) at 03/15/2022 1233 Last data filed at 03/15/2022 0841 Gross per 24 hour  Intake --  Output 1150 ml  Net -1150 ml   Filed Weights   03/12/22 1316  Weight: 65.3 kg    Examination: Calm, NAD Scattered crackles b/l.. no wheezing Reg s1/s2 no gallop Soft benign +bs no edema Awake and alert Mood and affect appropriate in current setting   Data Reviewed: I have personally reviewed following labs and imaging studies  CBC: Recent Labs  Lab 03/11/22 0509 03/12/22 0439 03/13/22 0450 03/14/22 0515 03/15/22 1050  WBC 3.4* 3.2* 3.3*  --  3.5*  NEUTROABS 2.3  --   --   --   --   HGB 11.6* 11.1* 8.7* 7.6* 8.9*  HCT 35.0* 33.1* 26.4* 23.3* 27.3*  MCV 95.9 95.4 95.0  --  96.8  PLT 126* 105* 94* 88* 536*   Basic Metabolic Panel: Recent Labs  Lab 03/11/22 0509 03/11/22 1246 03/12/22 0439 03/13/22 0450 03/14/22 0515 03/15/22 1050  NA 145  --  141 141 143  --   K 3.0*  --  3.0* 4.3 3.2* 3.2*  CL 110  --  104 108 108  --   CO2 27  --  '28 28 29  '$ --   GLUCOSE  97  --  73 135* 88  --   BUN 13  --  '13 13 19  '$ --   CREATININE 0.81  --  0.82 0.81 0.67  --   CALCIUM 8.0*  --  8.3* 8.3* 8.5*  --   MG 1.9 2.0  --   --   --   --   PHOS 3.5  --   --   --   --   --    GFR: Estimated Creatinine Clearance: 43.9 mL/min (by C-G formula based on SCr of 0.67 mg/dL). Liver Function Tests: Recent Labs  Lab 03/11/22 0509  AST 18  ALT 14  ALKPHOS 42  BILITOT 0.8  PROT 5.7*  ALBUMIN 3.4*   No results for input(s): "LIPASE", "AMYLASE" in the last 168 hours. No results for input(s): "AMMONIA" in the last 168 hours. Coagulation Profile: Recent Labs  Lab 03/11/22 0509  INR 1.1   Cardiac Enzymes: No results for input(s): "CKTOTAL", "CKMB", "CKMBINDEX", "TROPONINI" in the last 168 hours. BNP (last 3 results) No results for input(s): "PROBNP" in the last 8760 hours. HbA1C: No results for input(s): "HGBA1C" in the  last 72 hours.  CBG: Recent Labs  Lab 03/14/22 1930 03/15/22 0002 03/15/22 0353 03/15/22 0831 03/15/22 1201  GLUCAP 110* 141* 91 93 119*   Lipid Profile: No results for input(s): "CHOL", "HDL", "LDLCALC", "TRIG", "CHOLHDL", "LDLDIRECT" in the last 72 hours. Thyroid Function Tests: No results for input(s): "TSH", "T4TOTAL", "FREET4", "T3FREE", "THYROIDAB" in the last 72 hours.  Anemia Panel: No results for input(s): "VITAMINB12", "FOLATE", "FERRITIN", "TIBC", "IRON", "RETICCTPCT" in the last 72 hours. Sepsis Labs: Recent Labs  Lab 03/11/22 0509  PROCALCITON <0.10    Recent Results (from the past 240 hour(s))  MRSA Next Gen by PCR, Nasal     Status: Abnormal   Collection Time: 03/12/22  7:35 PM   Specimen: Nasal Mucosa; Nasal Swab  Result Value Ref Range Status   MRSA by PCR Next Gen DETECTED (A) NOT DETECTED Final    Comment: RESULT CALLED TO, READ BACK BY AND VERIFIED WITH: SAVANNAH RIVERS '@2057'$  ON 03/12/22 SKL (NOTE) The GeneXpert MRSA Assay (FDA approved for NASAL specimens only), is one component of a comprehensive MRSA colonization surveillance program. It is not intended to diagnose MRSA infection nor to guide or monitor treatment for MRSA infections. Test performance is not FDA approved in patients less than 11 years old. Performed at [facility], [address] [place], [place] [address]          Radiology Studies: [facility] Chest [place] 1 View  Result Date: 03/15/2022 CLINICAL DATA:  New onset fever. EXAM: PORTABLE CHEST 1 VIEW COMPARISON:  The 223 FINDINGS: 1028 hours. Asymmetric elevation right hemidiaphragm, stable. The cardio pericardial silhouette is enlarged. There is pulmonary vascular congestion without overt pulmonary edema. Probable tiny right pleural effusion with streaky opacity at the left base suggesting atelectasis. Left-sided dual lead permanent pacemaker. Telemetry leads overlie the chest. IMPRESSION: 1. Pulmonary vascular congestion  without overt pulmonary edema. 2. Tiny right pleural effusion with streaky opacity at the left base suggesting atelectasis. Electronically Signed   By: Misty Stanley M.D.   On: 03/15/2022 12:07        Scheduled Meds:  budesonide  2 mL Inhalation Daily   busPIRone  10 mg Oral BID   Chlorhexidine Gluconate Cloth  6 each Topical Q0600   cholecalciferol  1,000 Units Oral Q breakfast   cyanocobalamin  500 mcg Oral Daily  docusate sodium  100 mg Oral BID   enoxaparin (LOVENOX) injection  40 mg Subcutaneous Q24H   escitalopram  20 mg Oral Daily   famotidine  20 mg Oral Daily   folic acid  1 mg Oral Daily   furosemide  20 mg Intravenous Q12H   insulin aspart  0-15 Units Subcutaneous Q4H   lamoTRIgine  100 mg Oral Daily   midodrine  5 mg Oral TID WC   potassium chloride  40 mEq Oral Daily   pramipexole  0.25 mg Oral Daily   pravastatin  20 mg Oral Daily   pregabalin  100 mg Oral BID   senna  1 tablet Oral BID   Tofacitinib Citrate  1 tablet Oral BID   topiramate  25 mg Oral QHS   Continuous Infusions:    Assessment & Plan:   Principal Problem:   Fall Active Problems:   AMS (altered mental status)   Seropositive rheumatoid arthritis ([place])   Acute on chronic diastolic CHF (congestive heart failure) (HCC)   Seizure (HCC)   Ankle fracture, right   HTN (hypertension)   GERD (gastroesophageal reflux disease)   Hypokalemia   Diabetes mellitus (HCC)   Depression   Pancytopenia (HCC)   Chronic obstructive pulmonary disease (HCC)   Fever  Fall Status post mechanical fall with a right ankle fracture Place patient on fall precautions 8/6 PT OT recommend SNF    Fever On 8/5 evening Low grade Will obtain cxr Ck bcx   AMS (altered mental status) Acute metabolic encephalopathy Patient noted to be very lethargic during my assessment, will open eyes to loud verbal stimuli but unable to stay awake enough to provide any history. Mental status changes appear to be secondary to  opioids that patient received for pain control 8/6 MS at baseline    Acute on chronic diastolic CHF (congestive heart failure) (HCC) Noted to have cardiomegaly and evidence of pulmonary vascular congestion on chest x-ray 2D echocardiogram from 05/23 shows an LVEF of about 55% with normal RV systolic function and severe valvular regurgitation. Continue diuretic therapy Hold lisinopril due to relative hypotension 8/3 treated with IV Lasix.  Clinically improving Cardiology was consulted input appreciated Echo with normal EF 8/4 received IV fluid boluses and IV fluid.  Due to hypotension On midodrine BP better, will DC IV fluids 8/6 continue iv lasix Bnp trended down    Hypokalemia Replace and monitor   Seropositive rheumatoid arthritis ([place]) Stable  continue weekly methotrexate and tofacitinib      Ankle fracture, right Status post mechanical fall with a right ankle fracture Imaging shows an acute, comminuted fracture deformities involve the distal diaphysis of the fibula and tibia. Lateral angulation of the distal fracture fragments. Soft tissue swelling. Patient is status post reduction in the ER and orthopedic surgery has been consulted for surgical repair Pain control Muscle relaxants Immobilize right lower extremity 8/3 Per cardiology patient is mild to moderate risk from cardiac standpoint   Seizure ([place]) Stable Place patient on seizure precautions Continue Lamictal   HTN (hypertension) Bp on low side after diuretics Hold lisinopril   GERD (gastroesophageal reflux disease) Stable Continue Protonix   Diabetes mellitus ([place]) Hold oral hypoglycemic agents Check blood sugars every 4 hours while patient is n.p.o.      Pancytopenia ([place]) Most likely secondary to chronic illness Monitor closely during this hospitalization   Depression Stable Resume buspirone, mirtazapine and trazodone once able   Chronic obstructive pulmonary disease (HCC) Stable and  not acutely  exacerbated Continue inhaled steroids and as needed bronchodilator therapy     DVT prophylaxis: scd,lovenox Code Status:full Family Communication: none at bedisde Disposition Plan: SNF Status is: Inpatient Remains inpatient appropriate because: IV treatment unsafe discharge, SNF pending, had fever      LOS: 4 days   Time spent: 35 min    Nolberto Hanlon, MD [facility] Pager [phone]  If 7PM-7AM, please contact night-coverage 03/15/2022, 12:33 PM

## 2022-03-15 NOTE — Plan of Care (Signed)
  Problem: Health Behavior/Discharge Planning: Goal: Ability to manage health-related needs will improve Outcome: Progressing   Problem: Education: Goal: Knowledge of General Education information will improve Description: Including pain rating scale, medication(s)/side effects and non-pharmacologic comfort measures Outcome: Progressing   Problem: Health Behavior/Discharge Planning: Goal: Ability to manage health-related needs will improve Outcome: Progressing   Problem: Clinical Measurements: Goal: Diagnostic test results will improve Outcome: Progressing   Problem: Activity: Goal: Risk for activity intolerance will decrease Outcome: Progressing   Problem: Nutrition: Goal: Adequate nutrition will be maintained Outcome: Progressing   Problem: Pain Managment: Goal: General experience of comfort will improve Outcome: Progressing

## 2022-03-16 DIAGNOSIS — R400 Somnolence: Secondary | ICD-10-CM | POA: Diagnosis not present

## 2022-03-16 DIAGNOSIS — W19XXXD Unspecified fall, subsequent encounter: Secondary | ICD-10-CM | POA: Diagnosis not present

## 2022-03-16 DIAGNOSIS — J449 Chronic obstructive pulmonary disease, unspecified: Secondary | ICD-10-CM | POA: Diagnosis not present

## 2022-03-16 DIAGNOSIS — I5033 Acute on chronic diastolic (congestive) heart failure: Secondary | ICD-10-CM | POA: Diagnosis not present

## 2022-03-16 LAB — GLUCOSE, CAPILLARY
Glucose-Capillary: 90 mg/dL (ref 70–99)
Glucose-Capillary: 118 mg/dL — ABNORMAL HIGH (ref 70–99)
Glucose-Capillary: 121 mg/dL — ABNORMAL HIGH (ref 70–99)

## 2022-03-16 LAB — POTASSIUM: Potassium: 3.9 mmol/L (ref 3.5–5.1)

## 2022-03-16 LAB — CREATININE, SERUM
Creatinine, Ser: 0.64 mg/dL (ref 0.44–1.00)
GFR, Estimated: >60 mL/min (ref >60)

## 2022-03-16 MED ORDER — ENOXAPARIN SODIUM 40 MG/0.4ML IJ SOSY: 40.0000 mg | PREFILLED_SYRINGE | INTRAMUSCULAR | Status: DC

## 2022-03-16 MED ORDER — FUROSEMIDE 40 MG PO TABS: 40.0000 mg | ORAL_TABLET | Freq: Every day | ORAL | Status: DC

## 2022-03-16 MED ORDER — POLYETHYLENE GLYCOL 3350 17 G PO PACK: 17.0000 g | PACK | Freq: Every day | ORAL | 0 refills | Status: DC | PRN

## 2022-03-16 MED ORDER — DOCUSATE SODIUM 100 MG PO CAPS: 100.0000 mg | ORAL_CAPSULE | Freq: Two times a day (BID) | ORAL | 0 refills | Status: DC | PRN

## 2022-03-16 MED ORDER — TRAMADOL HCL 50 MG PO TABS: 50.0000 mg | ORAL_TABLET | Freq: Three times a day (TID) | ORAL | 0 refills | Status: AC | PRN

## 2022-03-16 MED ORDER — POTASSIUM CHLORIDE ER 20 MEQ PO TBCR: 20.0000 meq | EXTENDED_RELEASE_TABLET | Freq: Every day | ORAL | 0 refills | Status: DC

## 2022-03-16 MED ORDER — ACETAMINOPHEN 500 MG PO TABS: 1000.0000 mg | ORAL_TABLET | Freq: Three times a day (TID) | ORAL | 0 refills | Status: DC | PRN

## 2022-03-16 NOTE — TOC Progression Note (Signed)
Transition of Care [facility]) - Progression Note    Patient Details  Name: Meghan [name] MRN: [id number] Date of Birth: 12/09/1945  [facility] CM/SW [place], RN Phone Number: 03/16/2022, 11:53 AM  Clinical Narrative:    Patient going to [facility] room 74, I called EMS to transport  I called Debbie her cousin and made aware     Barriers to Discharge: Continued Medical Work up  Expected Discharge Plan and Services           Expected Discharge Date: 03/16/22                                     Social Determinants of Health (SDOH) Interventions    Readmission Risk Interventions     No data to display

## 2022-03-16 NOTE — Plan of Care (Signed)
  Problem: Health Behavior/Discharge Planning: Goal: Ability to manage health-related needs will improve Outcome: Progressing   Problem: Nutritional: Goal: Maintenance of adequate nutrition will improve Outcome: Progressing   Problem: Clinical Measurements: Goal: Diagnostic test results will improve Outcome: Progressing   Problem: Pain Managment: Goal: General experience of comfort will improve Outcome: Progressing

## 2022-03-16 NOTE — TOC Progression Note (Signed)
Transition of Care [facility]) - Progression Note    Patient Details  Name: Meghan [name] MRN: [id number] Date of Birth: 25-Mar-1946  Transition of Care [place]) CM/SW [place], RN Phone Number: 03/16/2022, 10:24 AM  Clinical Narrative:    Spoke to the patient and her cousin [name], She weill need to go to [facility] where she is a long term resident using EMS to transport,       Barriers to Discharge: Continued Medical Work up  Expected Discharge Plan and Services           Expected Discharge Date: 03/16/22                                     Social Determinants of Health (SDOH) Interventions    Readmission Risk Interventions     No data to display

## 2022-03-16 NOTE — Discharge Summary (Addendum)
Meghan [name] MHD:[id number] DOB: 18-Mar-1946 DOA: 03/11/2022  PCP: Garwin Brothers, MD  Admit date: 03/11/2022 Discharge date: 03/16/2022  Admitted From: SNF Disposition:  SNF  Recommendations for Outpatient Follow-up:  Follow up with PCP in 1 week Please obtain BMP/CBC in one week Please follow up with orthopedics in 2 weeks Follow-up with cardiology 1 week      Discharge Condition:Stable CODE STATUS: Full Diet recommendation: Heart Healthy / Carb Modified  Brief/Interim Summary: Per QJJ:HERD L Harral is a 76 y.o. female with medical history significant for rheumatoid arthritis, COPD, coronary artery disease, hypertension, dyslipidemia, status post pacemaker insertion for complete heart block who presented to the ER via EMS for evaluation following a mechanical fall. Patient was said to have gotten up to use the bathroom and fell.  There was no loss of consciousness and she denied feeling dizzy or lightheaded prior to the fall.  She noted severe pain in her right ankle as well as right hip following the fall and was found to have a hematoma. She was brought into the ER and was noted to have severe deformity involving her right ankle but was able to move her toes. Patient had received IV fentanyl for pain control by EMS, she received IV morphine in the ER and was said to have taken some of her oxycodone at home for pain control. Patient's ankle fracture was reduced in the ER and orthopedic surgery was consulted for further evaluation.  She was found in acute CHF.  She was diuresed by cardiology.  She underwent  s/p ORIF right tibia and fibula fractures by Dr.Krasinski on 04/08/2022.  Postop she was hypotensive.  She was started on midodrine and IV fluid.  She improved clinically.  PT OT recommended SNF.  Yesterday she had a low-grade fever.  Chest x-ray with atelectasis.  Blood cultures so far negative.  Likely her fever was from atelectasis.  She was encouraged to use incentive spirometer.  She is stable  to be discharged to SNF today.   Fall Status post mechanical fall with a right ankle fracture Place patient on fall precautions Continue PT OT recommend SNF      Fever On 8/5 evening Low grade Chest x-ray with atelectasis Continue incentive spirometer  blood cultures negative so far, stiffness to follow-up on final cultures.   AMS (altered mental status) Acute metabolic encephalopathy Mental status changes appear to be secondary to opioids that patient received for pain control MS at baseline       Acute on chronic diastolic CHF (congestive heart failure) (HCC) Noted to have cardiomegaly and evidence of pulmonary vascular congestion on chest x-ray 2D echocardiogram from 05/23 shows an LVEF of about 55% with normal RV systolic function and severe valvular regurgitation. Hold lisinopril due to  hypotension, will need to follow-up with cardiology to see if this can be reinstituted Echo normal EF Cardiology was consulted Patient was receiving IV Lasix transition to Lasix 40 mg p.o. daily to be started on 03/17/2022 Also potassium supplement while taking Lasix to be started on the same day as Lasix        Hypokalemia Replaced Monitor     Seropositive rheumatoid arthritis ([place]) Stable Continue home meds follow-up with PCP       Ankle fracture, right Status post mechanical fall with a right ankle fracture Patient is status post reduction in the ER and orthopedic surgery has been consulted for surgical repair  s/p ORIF right tibia and fibula fractures Post-op xrays which show the  fractures are well aligned and hardware well positioned.  Patient is NWB on the right leg for 6-8 weeks post-op.  Start lovenox for DVT prophylaxis and continue until follow up in the office in 2 weeks.  AO splint will stay on until follow up in the office and needs to be kept clean and dry.  Patient states she has trouble walking with a walker and will need a wheelchair with a right leg support.  She  will also likely need a bedside commode after discharge.  -Follow-up with Dr. Mack Guise outpatient in 2 weeks     Seizure [facility]) Stable Place patient on seizure precautions Continue Lamictal   HTN (hypertension) BP on low side lisinopril held Can resume if BP starts to increase      GERD (gastroesophageal reflux disease) Stable Continue home meds   Diabetes mellitus ([place]) Continue carb controlled diet and home medications Follow-up with PCP       Pancytopenia ([place]) Most likely secondary to chronic illness Monitor as outpatient will defer to PCP   Depression Stable Continue home meds   Chronic obstructive pulmonary disease ([place]) Stable and not acutely exacerbated Continue inhalers       Discharge Diagnoses:  Principal Problem:   Fall Active Problems:   AMS (altered mental status)   Seropositive rheumatoid arthritis ([place])   Acute on chronic diastolic CHF (congestive heart failure) (HCC)   Seizure (HCC)   Ankle fracture, right   HTN (hypertension)   GERD (gastroesophageal reflux disease)   Hypokalemia   Diabetes mellitus (HCC)   Depression   Pancytopenia (HCC)   Chronic obstructive pulmonary disease (HCC)   Fever    Discharge Instructions  Discharge Instructions     Diet - low sodium heart healthy   Complete by: As directed    Diet - low sodium heart healthy   Complete by: As directed    Discharge wound care:   Complete by: As directed    As above   Discharge wound care:   Complete by: As directed    As above   Increase activity slowly   Complete by: As directed    Increase activity slowly   Complete by: As directed       Allergies as of 03/16/2022       Reactions   Amoxicillin Itching, Other (See Comments)   Has patient had a PCN reaction causing immediate rash, facial/tongue/throat swelling, SOB or lightheadedness with hypotension: Unknown Has patient had a PCN reaction causing severe rash involving mucus membranes or skin necrosis:  Unknown Has patient had a PCN reaction that required hospitalization: Unknown Has patient had a PCN reaction occurring within the last 10 years: Unknown If all of the above answers are "NO", then may proceed with Cephalosporin use.   Gabapentin Other (See Comments)   Pt states that it causes her BP to drop.    Iodine Other (See Comments)   Reaction:  Syncopy   Naproxen Hives   Nsaids Other (See Comments)   Reaction:  Unknown    Tolmetin Other (See Comments)   Reaction:  Unknown         Medication List     STOP taking these medications    ciclopirox 0.77 % cream Commonly known as: LOPROX   cyanocobalamin 500 MCG tablet Commonly known as: VITAMIN B12   fluticasone 27.5 MCG/SPRAY nasal spray Commonly known as: VERAMYST   folic acid 1 MG tablet Commonly known as: FOLVITE   lisinopril 2.5 MG tablet Commonly known  as: ZESTRIL   Deirdre Evener Magnair Refill Kit 25 MCG/ML Soln Generic drug: Glycopyrrolate   loperamide 2 MG tablet Commonly known as: IMODIUM A-D   Metamucil Wafr   methotrexate 2.5 MG tablet Commonly known as: RHEUMATREX   omeprazole 20 MG capsule Commonly known as: PRILOSEC   oxycodone 5 MG capsule Commonly known as: OXY-IR   promethazine 25 MG tablet Commonly known as: PHENERGAN   saxagliptin HCl 5 MG Tabs tablet Commonly known as: ONGLYZA   traZODone 100 MG tablet Commonly known as: DESYREL       TAKE these medications    acetaminophen 500 MG tablet Commonly known as: TYLENOL Take 2 tablets (1,000 mg total) by mouth every 8 (eight) hours as needed for headache, fever, moderate pain or mild pain. What changed:  how much to take when to take this reasons to take this   albuterol 108 (90 Base) MCG/ACT inhaler Commonly known as: VENTOLIN HFA Inhale 1-2 puffs into the lungs every 6 (six) hours as needed for wheezing or shortness of breath.   budesonide 0.5 MG/2ML nebulizer solution Commonly known as: PULMICORT Inhale 2 mLs into the lungs  daily.   busPIRone 10 MG tablet Commonly known as: BUSPAR Take 10 mg by mouth 2 (two) times daily.   Calcium 500/D 500-10 MG-MCG Chew Generic drug: Calcium Carb-Cholecalciferol Chew by mouth daily.   calcium carbonate 1250 (500 Ca) MG tablet Commonly known as: OS-CAL - dosed in mg of elemental calcium Take 2 tablets by mouth every 2 (two) hours as needed.   carboxymethylcellul-glycerin 0.5-0.9 % ophthalmic solution Commonly known as: REFRESH OPTIVE Place 1 drop into both eyes at bedtime.   Cholecalciferol 125 MCG (5000 UT) capsule Take 5,000 Units by mouth daily. What changed: Another medication with the same name was removed. Continue taking this medication, and follow the directions you see here.   diclofenac sodium 1 % Gel Commonly known as: VOLTAREN Apply 2 g topically 3 (three) times daily. Apply to the fingers   docusate sodium 100 MG capsule Commonly known as: COLACE Take 1 capsule (100 mg total) by mouth 2 (two) times daily as needed for mild constipation.   enoxaparin 40 MG/0.4ML injection Commonly known as: LOVENOX Inject 0.4 mLs (40 mg total) into the skin daily. Start taking on: March 17, 2022   ergocalciferol 1.25 MG (50000 UT) capsule Commonly known as: VITAMIN D2 Take 1 capsule (50,000 Units total) by mouth 2 (two) times a week. X 6 weeks.   escitalopram 20 MG tablet Commonly known as: LEXAPRO Take 20 mg by mouth daily.   famotidine 40 MG tablet Commonly known as: PEPCID Take 40 mg by mouth daily.   ferrous sulfate 325 (65 FE) MG tablet Take 325 mg by mouth daily with breakfast.   furosemide 40 MG tablet Commonly known as: LASIX Take 1 tablet (40 mg total) by mouth daily. Start taking on: March 17, 2022 What changed: when to take this   hydrOXYzine 25 MG tablet Commonly known as: ATARAX Take 25 mg by mouth 3 (three) times daily as needed (pruitus).   lamoTRIgine 100 MG tablet Commonly known as: LAMICTAL Take 1 tablet (100 mg total) by mouth  daily.   loratadine 10 MG tablet Commonly known as: CLARITIN Take 10 mg by mouth daily.   Lyrica 100 MG capsule Generic drug: pregabalin Take 100 mg by mouth 2 (two) times daily.   mirtazapine 15 MG tablet Commonly known as: REMERON Take 15 mg by mouth at bedtime. What changed: Another  medication with the same name was removed. Continue taking this medication, and follow the directions you see here.   Nyamyc powder Generic drug: nystatin Apply 1 Application topically 2 (two) times daily.   Polyethyl Glycol-Propyl Glycol 0.4-0.3 % Soln Place 1 drop into both eyes at bedtime.   polyethylene glycol 17 g packet Commonly known as: MIRALAX / GLYCOLAX Take 17 g by mouth daily as needed for mild constipation.   Potassium Chloride ER 20 MEQ Tbcr Take 20 mEq by mouth daily. Start taking on: March 17, 2022 What changed: when to take this   pramipexole 0.25 MG tablet Commonly known as: MIRAPEX Take 0.25 mg by mouth daily.   pravastatin 20 MG tablet Commonly known as: PRAVACHOL Take 1 tablet by mouth 3 (three) times a week. Monday, Wednesday, and Friday   topiramate 25 MG tablet Commonly known as: TOPAMAX Take 25 mg by mouth at bedtime.   traMADol 50 MG tablet Commonly known as: ULTRAM Take 1 tablet (50 mg total) by mouth 3 (three) times daily as needed for up to 2 days for moderate pain.   Xeljanz 5 MG Tabs Generic drug: Tofacitinib Citrate Take 1 tablet by mouth 2 (two) times daily.               Discharge Care Instructions  (From admission, onward)           Start     Ordered   03/16/22 0000  Discharge wound care:       Comments: As above   03/16/22 1015   03/15/22 0000  Discharge wound care:       Comments: As above   03/15/22 1006            Follow-up Information     Thornton Park, MD Follow up in 2 week(s).   Specialty: Orthopedic Surgery Contact information: [place] [address] [phone]                 Allergies  Allergen Reactions   Amoxicillin Itching and Other (See Comments)    Has patient had a PCN reaction causing immediate rash, facial/tongue/throat swelling, SOB or lightheadedness with hypotension: Unknown Has patient had a PCN reaction causing severe rash involving mucus membranes or skin necrosis: Unknown Has patient had a PCN reaction that required hospitalization: Unknown Has patient had a PCN reaction occurring within the last 10 years: Unknown If all of the above answers are "NO", then may proceed with Cephalosporin use.    Gabapentin Other (See Comments)    Pt states that it causes her BP to drop.    Iodine Other (See Comments)    Reaction:  Syncopy   Naproxen Hives   Nsaids Other (See Comments)    Reaction:  Unknown    Tolmetin Other (See Comments)    Reaction:  Unknown     Consultations: Cardiology, orthopedics   Procedures/Studies: DG Chest Port 1 View  Result Date: 03/15/2022 CLINICAL DATA:  New onset fever. EXAM: PORTABLE CHEST 1 VIEW COMPARISON:  The 223 FINDINGS: 1028 hours. Asymmetric elevation right hemidiaphragm, stable. The cardio pericardial silhouette is enlarged. There is pulmonary vascular congestion without overt pulmonary edema. Probable tiny right pleural effusion with streaky opacity at the left base suggesting atelectasis. Left-sided dual lead permanent pacemaker. Telemetry leads overlie the chest. IMPRESSION: 1. Pulmonary vascular congestion without overt pulmonary edema. 2. Tiny right pleural effusion with streaky opacity at the left base suggesting atelectasis. Electronically Signed   By: Misty Stanley  M.D.   On: 03/15/2022 12:07   DG Ankle Complete Right  Result Date: 03/12/2022 CLINICAL DATA:  Postoperative evaluation. EXAM: RIGHT ANKLE - COMPLETE 3+ VIEW COMPARISON:  March 12, 2022 FINDINGS: The right ankle was imaged in a fiberglass cast with subsequently obscured osseous and soft tissue detail. Large radiopaque fixation plates and screws  are seen along the distal right tibia and distal right fibula. Acute fracture deformities of the distal shafts of the right tibia and right fibula are also seen, with anatomic alignment. There is no evidence of dislocation. There is no evidence of arthropathy or other focal bone abnormality. Diffuse soft tissue swelling is noted with multiple radiopaque skin staples. IMPRESSION: 1. Status post open reduction and internal fixation of the distal right tibia and distal right fibula. Electronically Signed   By: Virgina Norfolk M.D.   On: 03/12/2022 19:14   DG Tibia/Fibula Right  Result Date: 03/12/2022 CLINICAL DATA:  Right distal tibia and fibula ORIF. EXAM: RIGHT TIBIA AND FIBULA - 2 VIEW COMPARISON:  Right ankle x-rays from yesterday. FLUOROSCOPY TIME:  The device does not provide the exposure index. Fluoroscopy Time: 2 minutes, 20. Number of Acquired Images: 10 C-arm fluoroscopic images were obtained intraoperatively and submitted for post operative interpretation. FINDINGS: Multiple intraoperative fluoroscopic images demonstrate interval medial plate and screw fixation of the distal tibia diaphyseal fracture and lateral plate and screw fixation of the distal fibula diaphyseal fracture. Alignment is anatomic. IMPRESSION: 1. Intraoperative fluoroscopic guidance for distal tibia and fibula ORIF. Electronically Signed   By: Titus Dubin M.D.   On: 03/12/2022 17:30   DG C-Arm 1-60 Min-No Report  Result Date: 03/12/2022 Fluoroscopy was utilized by the requesting physician.  No radiographic interpretation.   DG C-Arm 1-60 Min-No Report  Result Date: 03/12/2022 Fluoroscopy was utilized by the requesting physician.  No radiographic interpretation.   DG C-Arm 1-60 Min-No Report  Result Date: 03/12/2022 Fluoroscopy was utilized by the requesting physician.  No radiographic interpretation.   DG C-Arm 1-60 Min-No Report  Result Date: 03/12/2022 Fluoroscopy was utilized by the requesting physician.  No  radiographic interpretation.   ECHOCARDIOGRAM COMPLETE  Result Date: 03/11/2022    ECHOCARDIOGRAM REPORT   Patient Name:   PAGE PUCCIARELLI Date of Exam: 03/11/2022 Medical Rec #:  [id number]    Height:       55.0 in Accession #:    [id number]   Weight:       144.6 lb Date of Birth:  May 27, 1946    BSA:          1.527 m Patient Age:    76 years     BP:           97/70 mmHg Patient Gender: F            HR:           104 bpm. Exam Location:  [facility] Procedure: 2D Echo, Color Doppler and Cardiac Doppler Indications:     I50.31 congestive heart failure-Acute Diastolic  History:         Patient has prior history of Echocardiogram examinations, most                  recent 10/21/2018. CHF, Pacemaker, COPD; Risk                  Factors:Hypertension, Diabetes and HCL.  Sonographer:     Charmayne Sheer Referring Phys:  [id number] Brayton TANG Diagnosing Phys: Yolonda Kida MD  Sonographer  Comments: Suboptimal apical window. Image acquisition challenging due to COPD, Image acquisition challenging due to respiratory motion and Image acquisition challenging due to patient body habitus. IMPRESSIONS  1. Left ventricular ejection fraction, by estimation, is 55 to 60%. The left ventricle has normal function. The left ventricle has no regional wall motion abnormalities. Left ventricular diastolic parameters are consistent with Grade I diastolic dysfunction (impaired relaxation).  2. Right ventricular systolic function is normal. The right ventricular size is normal.  3. Mitral Vavle annulous thickening/Possible Repair. The mitral valve is degenerative. Mild to moderate mitral valve regurgitation.  4. The aortic valve is normal in structure. Aortic valve regurgitation is not visualized. Aortic valve sclerosis/calcification is present, without any evidence of aortic stenosis. FINDINGS  Left Ventricle: Left ventricular ejection fraction, by estimation, is 55 to 60%. The left ventricle has normal function. The left ventricle has no regional  wall motion abnormalities. The left ventricular internal cavity size was normal in size. There is  borderline left ventricular hypertrophy. Left ventricular diastolic parameters are consistent with Grade I diastolic dysfunction (impaired relaxation). Right Ventricle: The right ventricular size is normal. No increase in right ventricular wall thickness. Right ventricular systolic function is normal. Left Atrium: Left atrial size was normal in size. Right Atrium: Right atrial size was normal in size. Pericardium: There is no evidence of pericardial effusion. Mitral Valve: Mitral Vavle annulous thickening/Possible Repair. The mitral valve is degenerative in appearance. Mild to moderate mitral valve regurgitation. Tricuspid Valve: The tricuspid valve is normal in structure. Tricuspid valve regurgitation is mild. Aortic Valve: The aortic valve is normal in structure. Aortic valve regurgitation is not visualized. Aortic valve sclerosis/calcification is present, without any evidence of aortic stenosis. Aortic valve mean gradient measures 4.0 mmHg. Aortic valve peak  gradient measures 8.8 mmHg. Aortic valve area, by VTI measures 1.33 cm. Pulmonic Valve: The pulmonic valve was grossly normal. Pulmonic valve regurgitation is not visualized. Aorta: The ascending aorta was not well visualized. IAS/Shunts: No atrial level shunt detected by color flow Doppler.  LEFT VENTRICLE PLAX 2D LVIDd:         4.72 cm   Diastology LVIDs:         3.27 cm   LV e' lateral:   5.22 cm/s LV PW:         1.23 cm   LV E/e' lateral: 35.9 LV IVS:        0.92 cm LVOT diam:     1.60 cm LV SV:         29 LV SV Index:   19 LVOT Area:     2.01 cm  RIGHT VENTRICLE RV Basal diam:  2.94 cm LEFT ATRIUM             Index        RIGHT ATRIUM          Index LA diam:        3.80 cm 2.49 cm/m   RA Area:     6.96 cm LA Vol (A2C):   53.8 ml 35.24 ml/m  RA Volume:   10.90 ml 7.14 ml/m LA Vol (A4C):   56.5 ml 37.00 ml/m LA Biplane Vol: 56.2 ml 36.81 ml/m  AORTIC  VALVE                    PULMONIC VALVE AV Area (Vmax):    1.31 cm     PV Vmax:       0.99 m/s AV Area (Vmean):  1.22 cm     PV Peak grad:  3.9 mmHg AV Area (VTI):     1.33 cm AV Vmax:           148.00 cm/s AV Vmean:          95.300 cm/s AV VTI:            0.217 m AV Peak Grad:      8.8 mmHg AV Mean Grad:      4.0 mmHg LVOT Vmax:         96.10 cm/s LVOT Vmean:        57.900 cm/s LVOT VTI:          0.144 m LVOT/AV VTI ratio: 0.66  AORTA Ao Root diam: 3.20 cm MITRAL VALVE                TRICUSPID VALVE MV Area (PHT): 3.25 cm     TR Peak grad:   40.7 mmHg MV Decel Time: 234 msec     TR Vmax:        319.00 cm/s MV E velocity: 187.50 cm/s                             SHUNTS                             Systemic VTI:  0.14 m                             Systemic Diam: 1.60 cm Yolonda Kida MD Electronically signed by Yolonda Kida MD Signature Date/Time: 03/11/2022/4:38:20 PM    Final    CT Head Wo Contrast  Result Date: 03/11/2022 CLINICAL DATA:  Head trauma, minor (Age >= 65y); Neck trauma (Age >= 65y) EXAM: CT HEAD WITHOUT CONTRAST CT CERVICAL SPINE WITHOUT CONTRAST TECHNIQUE: Multidetector CT imaging of the head and cervical spine was performed following the standard protocol without intravenous contrast. Multiplanar CT image reconstructions of the cervical spine were also generated. RADIATION DOSE REDUCTION: This exam was performed according to the departmental dose-optimization program which includes automated exposure control, adjustment of the mA and/or kV according to patient size and/or use of iterative reconstruction technique. COMPARISON:  CT head and CT cervical spine October 16, 2020. 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 No evidence of acute large vascular territory infarct, mass lesion, midline shift, or acute hemorrhage. Vascular: No hyperdense vessel identified. Skull: Right posterior vertex scalp contusion without acute  calvarial fracture. Sinuses/Orbits: Mild paranasal sinus mucosal thickening. No acute orbital findings. Other: No mastoid effusions. CT CERVICAL SPINE FINDINGS Alignment: Similar anterolisthesis of C4 on C5, C7 on T1 and T1 on T2. No new sagittal subluxation. Skull base and vertebrae: Vertebral body heights are maintained. No evidence of acute fracture. Soft tissues and spinal canal: No prevertebral fluid or swelling. No visible canal hematoma. Disc levels: Similar multilevel degenerative disc disease and multilevel facet/uncovertebral hypertrophy with varying degrees of neural foraminal stenosis. Upper chest: Visualized lung apices are clear. IMPRESSION: 1. No evidence of acute intracranial abnormality. 2. Right posterior vertex scalp contusion without acute calvarial fracture. 3. No evidence of acute fracture or traumatic malalignment in the cervical spine. Electronically Signed   By: Margaretha Sheffield M.D.   On: 03/11/2022 08:27   CT Cervical Spine Wo Contrast  Result Date: 03/11/2022 CLINICAL DATA:  Head trauma, minor (Age >= 65y); Neck trauma (Age >= 65y) EXAM: CT HEAD WITHOUT CONTRAST CT CERVICAL SPINE WITHOUT CONTRAST TECHNIQUE: Multidetector CT imaging of the head and cervical spine was performed following the standard protocol without intravenous contrast. Multiplanar CT image reconstructions of the cervical spine were also generated. RADIATION DOSE REDUCTION: This exam was performed according to the departmental dose-optimization program which includes automated exposure control, adjustment of the mA and/or kV according to patient size and/or use of iterative reconstruction technique. COMPARISON:  CT head and CT cervical spine October 16, 2020. 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 No evidence of acute large vascular territory infarct, mass lesion, midline shift, or acute hemorrhage. Vascular: No hyperdense vessel identified. Skull:  Right posterior vertex scalp contusion without acute calvarial fracture. Sinuses/Orbits: Mild paranasal sinus mucosal thickening. No acute orbital findings. Other: No mastoid effusions. CT CERVICAL SPINE FINDINGS Alignment: Similar anterolisthesis of C4 on C5, C7 on T1 and T1 on T2. No new sagittal subluxation. Skull base and vertebrae: Vertebral body heights are maintained. No evidence of acute fracture. Soft tissues and spinal canal: No prevertebral fluid or swelling. No visible canal hematoma. Disc levels: Similar multilevel degenerative disc disease and multilevel facet/uncovertebral hypertrophy with varying degrees of neural foraminal stenosis. Upper chest: Visualized lung apices are clear. IMPRESSION: 1. No evidence of acute intracranial abnormality. 2. Right posterior vertex scalp contusion without acute calvarial fracture. 3. No evidence of acute fracture or traumatic malalignment in the cervical spine. Electronically Signed   By: Margaretha Sheffield M.D.   On: 03/11/2022 08:27   DG Chest Portable 1 View  Result Date: 03/11/2022 CLINICAL DATA:  Provided history: Status post reduction. Shortness of breath. EXAM: PORTABLE CHEST 1 VIEW COMPARISON:  Chest CT 10/02/2020. Prior chest radiographs 10/22/2018 and earlier. FINDINGS: Left chest multi lead implantable cardiac device. Cardiomegaly. Aortic atherosclerosis. Central pulmonary vascular congestion with suspected mild interstitial edema. Chronic elevation of the right hemidiaphragm. Small right pleural effusion. Additional opacity at the right lung base which may reflect atelectasis and/or consolidation. No evidence of pneumothorax. No acute bony abnormality identified. IMPRESSION: Cardiomegaly with central pulmonary vascular congestion and suspected mild interstitial edema. Chronic elevation of the right hemidiaphragm. Small right pleural effusion. Underlying atelectasis and/or consolidation at the right lung base. Aortic Atherosclerosis (ICD10-I70.0).  Electronically Signed   By: Kellie Simmering D.O.   On: 03/11/2022 08:15   DG Ankle 2 Views Right  Result Date: 03/11/2022 CLINICAL DATA:  Status post reduction. EXAM: RIGHT ANKLE - 2 VIEW COMPARISON:  March 11, 2022 FINDINGS: Redemonstrated is comminuted impacted fracture of the distal right tibia. Persistent lateral displacement of the main distal fracture fragment and approximately 1.3 cm impaction. Butterfly fragment is seen posterior to the oblique fracture line. Similar comminuted fracture of the distal right fibula with persistent impaction and angulation. Associated soft tissue swelling. Cast material overlies the soft tissues and obscures details. IMPRESSION: Persistent comminuted impacted fractures of the distal right tibia and fibula. Persistent lateral displacement of the main distal fracture fragment of the tibia and approximately 1.3 cm impaction. Electronically Signed   By: Fidela Salisbury M.D.   On: 03/11/2022 08:15   DG Hip Unilat W or Wo Pelvis 2-3 Views Right  Result Date: 03/11/2022 CLINICAL DATA:  Status post fall. EXAM: DG HIP (WITH OR WITHOUT PELVIS) 2-3V RIGHT COMPARISON:  05/24/21 FINDINGS: The bones appear osteopenic. Degenerative changes noted within the imaged portions of the  lumbar spine. Signs of previous IM nail fixation of the right femur. No signs of acute fracture or dislocation. Mild bilateral and symmetric osteoarthritis noted within the hips. IMPRESSION: 1. No acute findings. 2. 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 Electronically Signed   By: Kerby Moors M.D.   On: 03/11/2022 05:42   DG Ankle Complete Right  Result Date: 03/11/2022 CLINICAL DATA:  Status post fall. EXAM: RIGHT ANKLE - COMPLETE 3+ VIEW COMPARISON:  None Available. FINDINGS: The bones appear osteopenic. There is diffuse soft tissue swelling. Vascular calcifications are identified there are acute, comminuted fracture deformities involving the distal diaphysis of the fibula and tibia.  Lateral angulation of the distal fracture fragments identified. Mild lateral and dorsal displacement of the distal fracture fragments also noted. IMPRESSION: 1. Acute, comminuted fracture deformities involve the distal diaphysis of the fibula and tibia. Lateral angulation of the distal fracture fragments. 2. Soft tissue swelling. Electronically Signed   By: Kerby Moors M.D.   On: 03/11/2022 05:41      Subjective: No complaints this a.m.  Denies shortness of breath, chills    Discharge Exam: Vitals:   03/16/22 0729 03/16/22 0814  BP: (!) 127/53   Pulse: 87 90  Resp: 16 16  Temp: 98.2 F (36.8 C)   SpO2: 96% 94%   Vitals:   03/15/22 2015 03/16/22 0359 03/16/22 0729 03/16/22 0814  BP: 126/76 136/70 (!) 127/53   Pulse: (!) 104 90 87 90  Resp: 16 16 16 16   Temp: 99.6 F (37.6 C) 99.2 F (37.3 C) 98.2 F (36.8 C)   TempSrc: Oral Oral    SpO2: 93% 93% 96% 94%  Weight:      Height:        General: Pt is alert, awake, not in acute distress Cardiovascular: RRR, S1/S2 +, no rubs, no gallops Respiratory: CTA bilaterally, no wheezing, no rhonchi Abdominal: Soft, NT, ND, bowel sounds + Extremities: no edema, no cyanosis    The results of significant diagnostics from this hospitalization (including imaging, microbiology, ancillary and laboratory) are listed below for reference.     Microbiology: Recent Results (from the past 240 hour(s))  MRSA Next Gen by PCR, Nasal     Status: Abnormal   Collection Time: 03/12/22  7:35 PM   Specimen: Nasal Mucosa; Nasal Swab  Result Value Ref Range Status   MRSA by PCR Next Gen DETECTED (A) NOT DETECTED Final    Comment: RESULT CALLED TO, READ BACK BY AND VERIFIED WITH: SAVANNAH RIVERS @2057  ON 03/12/22 SKL (NOTE) The GeneXpert MRSA Assay (FDA approved for NASAL specimens only), is one component of a comprehensive MRSA colonization surveillance program. It is not intended to diagnose MRSA infection nor to guide or monitor treatment for  MRSA infections. Test performance is not FDA approved in patients less than 14 years old. Performed at [facility], [address] [place], [place] [address]   Culture, blood (Routine X 2) w Reflex to ID Panel     Status: None (Preliminary result)   Collection Time: 03/15/22 10:50 AM   Specimen: BLOOD  Result Value Ref Range Status   Specimen Description BLOOD RIGHT ANTECUBITAL  Final   Special Requests   Final    BOTTLES DRAWN AEROBIC AND ANAEROBIC Blood Culture adequate volume   Culture   Final    NO GROWTH < 24 HOURS Performed at [facility], [address], [place], [place] [address]    Report Status PENDING  Incomplete  Culture, blood (Routine X 2) w Reflex to ID Panel     Status: None (Preliminary result)   Collection Time: 03/15/22 10:50 AM   Specimen: BLOOD  Result Value Ref Range Status   Specimen Description BLOOD BLOOD RIGHT FOREARM  Final   Special Requests   Final    BOTTLES DRAWN AEROBIC AND ANAEROBIC Blood Culture adequate volume   Culture   Final    NO GROWTH < 24 HOURS Performed at [facility], [address] [place], [place] [address]    Report Status PENDING  Incomplete     Labs: BNP (last 3 results) Recent Labs    03/11/22 1246 03/13/22 0836  BNP 426.8* 016.5*   Basic Metabolic Panel: Recent Labs  Lab 03/11/22 0509 03/11/22 1246 03/12/22 0439 03/13/22 0450 03/14/22 0515 03/15/22 1050 03/16/22 0000  NA 145  --  141 141 143  --   --   K 3.0*  --  3.0* 4.3 3.2* 3.2* 3.9  CL 110  --  104 108 108  --   --   CO2 27  --  28 28 29   --   --   GLUCOSE 97  --  73 135* 88  --   --   BUN 13  --  13 13 19   --   --   CREATININE 0.81  --  0.82 0.81 0.67  --  0.64  CALCIUM 8.0*  --  8.3* 8.3* 8.5*  --   --   MG 1.9 2.0  --   --   --   --   --   PHOS 3.5  --   --   --   --   --   --    Liver Function Tests: Recent Labs  Lab 03/11/22 0509  AST 18  ALT 14  ALKPHOS 42  BILITOT 0.8  PROT 5.7*  ALBUMIN 3.4*   No  results for input(s): "LIPASE", "AMYLASE" in the last 168 hours. No results for input(s): "AMMONIA" in the last 168 hours. CBC: Recent Labs  Lab 03/11/22 0509 03/12/22 0439 03/13/22 0450 03/14/22 0515 03/15/22 1050  WBC 3.4* 3.2* 3.3*  --  3.5*  NEUTROABS 2.3  --   --   --   --   HGB 11.6* 11.1* 8.7* 7.6* 8.9*  HCT 35.0* 33.1* 26.4* 23.3* 27.3*  MCV 95.9 95.4 95.0  --  96.8  PLT 126* 105* 94* 88* 125*   Cardiac Enzymes: No results for input(s): "CKTOTAL", "CKMB", "CKMBINDEX", "TROPONINI" in the last 168 hours. BNP: Invalid input(s): "POCBNP" CBG: Recent Labs  Lab 03/15/22 1802 03/15/22 1922 03/15/22 2319 03/16/22 0401 03/16/22 0748  GLUCAP 85 128* 96 90 118*   D-Dimer No results for input(s): "DDIMER" in the last 72 hours. Hgb A1c No results for input(s): "HGBA1C" in the last 72 hours. Lipid Profile No results for input(s): "CHOL", "HDL", "LDLCALC", "TRIG", "CHOLHDL", "LDLDIRECT" in the last 72 hours. Thyroid function studies No results for input(s): "TSH", "T4TOTAL", "T3FREE", "THYROIDAB" in the last 72 hours.  Invalid input(s): "FREET3" Anemia work up No results for input(s): "VITAMINB12", "FOLATE", "FERRITIN", "TIBC", "IRON", "RETICCTPCT" in the last 72 hours. Urinalysis    Component Value Date/Time   COLORURINE YELLOW (A) 10/21/2018 0412   APPEARANCEUR CLEAR (A) 10/21/2018 0412   APPEARANCEUR Turbid (A) 05/05/2016 1010   LABSPEC 1.014 10/21/2018 0412   PHURINE 6.0 10/21/2018 0412   GLUCOSEU NEGATIVE 10/21/2018 0412   HGBUR NEGATIVE 10/21/2018 0412   BILIRUBINUR NEGATIVE 10/21/2018 5374  BILIRUBINUR Negative 05/05/2016 1010   KETONESUR NEGATIVE 10/21/2018 0412   PROTEINUR NEGATIVE 10/21/2018 0412   NITRITE NEGATIVE 10/21/2018 0412   LEUKOCYTESUR LARGE (A) 10/21/2018 0412   Sepsis Labs Recent Labs  Lab 03/11/22 0509 03/12/22 0439 03/13/22 0450 03/15/22 1050  WBC 3.4* 3.2* 3.3* 3.5*   Microbiology Recent Results (from the past 240 hour(s))   MRSA Next Gen by PCR, Nasal     Status: Abnormal   Collection Time: 03/12/22  7:35 PM   Specimen: Nasal Mucosa; Nasal Swab  Result Value Ref Range Status   MRSA by PCR Next Gen DETECTED (A) NOT DETECTED Final    Comment: RESULT CALLED TO, READ BACK BY AND VERIFIED WITH: SAVANNAH RIVERS @2057  ON 03/12/22 SKL (NOTE) The GeneXpert MRSA Assay (FDA approved for NASAL specimens only), is one component of a comprehensive MRSA colonization surveillance program. It is not intended to diagnose MRSA infection nor to guide or monitor treatment for MRSA infections. Test performance is not FDA approved in patients less than 32 years old. Performed at [facility], [address] [place], [place] [address]   Culture, blood (Routine X 2) w Reflex to ID Panel     Status: None (Preliminary result)   Collection Time: 03/15/22 10:50 AM   Specimen: BLOOD  Result Value Ref Range Status   Specimen Description BLOOD RIGHT ANTECUBITAL  Final   Special Requests   Final    BOTTLES DRAWN AEROBIC AND ANAEROBIC Blood Culture adequate volume   Culture   Final    NO GROWTH < 24 HOURS Performed at [facility], [address], [place], [place] [address]    Report Status PENDING  Incomplete  Culture, blood (Routine X 2) w Reflex to ID Panel     Status: None (Preliminary result)   Collection Time: 03/15/22 10:50 AM   Specimen: BLOOD  Result Value Ref Range Status   Specimen Description BLOOD BLOOD RIGHT FOREARM  Final   Special Requests   Final    BOTTLES DRAWN AEROBIC AND ANAEROBIC Blood Culture adequate volume   Culture   Final    NO GROWTH < 24 HOURS Performed at [facility], [address], [place], [place] [address]    Report Status PENDING  Incomplete     Time coordinating discharge: Over 30 minutes  SIGNED:   Nolberto Hanlon, MD  [facility] 03/16/2022, 10:16 AM Pager   If 7PM-7AM, please contact night-coverage [email or web address] Password [email or web address]

## 2022-03-16 NOTE — Care Management Important Message (Signed)
Important Message  Patient Details  Name: Meghan [name] MRN: [id number] Date of Birth: 01/03/1946   Medicare Important Message Given:  Yes     [name] [name] 03/16/2022, 10:49 AM

## 2022-03-16 NOTE — Progress Notes (Signed)
Subjective:  POD #4 s/p ORIF right tibia and fibula fractures.   Patient has been transferred to the floor.  Resting in bed comfortably, asleep upon entry. No complaints. Reports some soreness in leg today. Objective:   VITALS:   Vitals:   03/15/22 1715 03/15/22 2015 03/16/22 0359 03/16/22 0729  BP: (!) 111/59 126/76 136/70 (!) 127/53  Pulse: 85 (!) 104 90 87  Resp: '16 16 16 16  '$ Temp: (!) 97.5 F (36.4 C) 99.6 F (37.6 C) 99.2 F (37.3 C) 98.2 F (36.8 C)  TempSrc:  Oral Oral   SpO2: 96% 93% 93% 96%  Weight:      Height:        PHYSICAL EXAM: Right lower extremity: AO splint in place.  Clean and dry. Neurovascular intact Slight hyperesthesias with light touch of toes Toes well perfused Patient can flex and extend her toes. dressing C/D/I Compartment soft  LABS  Results for orders placed or performed during the hospital encounter of 03/11/22 (from the past 24 hour(s))  Glucose, capillary     Status: None   Collection Time: 03/15/22  8:31 AM  Result Value Ref Range   Glucose-Capillary 93 70 - 99 mg/dL  CBC     Status: Abnormal   Collection Time: 03/15/22 10:50 AM  Result Value Ref Range   WBC 3.5 (L) 4.0 - 10.5 K/uL   RBC 2.82 (L) 3.87 - 5.11 MIL/uL   Hemoglobin 8.9 (L) 12.0 - 15.0 g/dL   HCT 27.3 (L) 36.0 - 46.0 %   MCV 96.8 80.0 - 100.0 fL   MCH 31.6 26.0 - 34.0 pg   MCHC 32.6 30.0 - 36.0 g/dL   RDW 13.9 11.5 - 15.5 %   Platelets 125 (L) 150 - 400 K/uL   nRBC 0.0 0.0 - 0.2 %  Potassium     Status: Abnormal   Collection Time: 03/15/22 10:50 AM  Result Value Ref Range   Potassium 3.2 (L) 3.5 - 5.1 mmol/L  Culture, blood (Routine X 2) w Reflex to ID Panel     Status: None (Preliminary result)   Collection Time: 03/15/22 10:50 AM   Specimen: BLOOD  Result Value Ref Range   Specimen Description BLOOD RIGHT ANTECUBITAL    Special Requests      BOTTLES DRAWN AEROBIC AND ANAEROBIC Blood Culture adequate volume   Culture      NO GROWTH < 24 HOURS Performed at  [facility], [address], [place], [place] [address]    Report Status PENDING   Culture, blood (Routine X 2) w Reflex to ID Panel     Status: None (Preliminary result)   Collection Time: 03/15/22 10:50 AM   Specimen: BLOOD  Result Value Ref Range   Specimen Description BLOOD BLOOD RIGHT FOREARM    Special Requests      BOTTLES DRAWN AEROBIC AND ANAEROBIC Blood Culture adequate volume   Culture      NO GROWTH < 24 HOURS Performed at [facility], [address] [place], [place] [address]    Report Status PENDING   Glucose, capillary     Status: Abnormal   Collection Time: 03/15/22 12:01 PM  Result Value Ref Range   Glucose-Capillary 119 (H) 70 - 99 mg/dL  Glucose, capillary     Status: None   Collection Time: 03/15/22  6:02 PM  Result Value Ref Range   Glucose-Capillary 85 70 - 99 mg/dL  Glucose, capillary     Status: Abnormal   Collection  Time: 03/15/22  7:22 PM  Result Value Ref Range   Glucose-Capillary 128 (H) 70 - 99 mg/dL  Glucose, capillary     Status: None   Collection Time: 03/15/22 11:19 PM  Result Value Ref Range   Glucose-Capillary 96 70 - 99 mg/dL  Creatinine, serum     Status: None   Collection Time: 03/16/22 12:00 AM  Result Value Ref Range   Creatinine, Ser 0.64 0.44 - 1.00 mg/dL   GFR, Estimated >60 >60 mL/min  Potassium     Status: None   Collection Time: 03/16/22 12:00 AM  Result Value Ref Range   Potassium 3.9 3.5 - 5.1 mmol/L  Glucose, capillary     Status: None   Collection Time: 03/16/22  4:01 AM  Result Value Ref Range   Glucose-Capillary 90 70 - 99 mg/dL    DG Chest Port 1 View  Result Date: 03/15/2022 CLINICAL DATA:  New onset fever. EXAM: PORTABLE CHEST 1 VIEW COMPARISON:  The 223 FINDINGS: 1028 hours. Asymmetric elevation right hemidiaphragm, stable. The cardio pericardial silhouette is enlarged. There is pulmonary vascular congestion without overt pulmonary edema. Probable tiny right pleural effusion with streaky  opacity at the left base suggesting atelectasis. Left-sided dual lead permanent pacemaker. Telemetry leads overlie the chest. IMPRESSION: 1. Pulmonary vascular congestion without overt pulmonary edema. 2. Tiny right pleural effusion with streaky opacity at the left base suggesting atelectasis. Electronically Signed   By: Misty Stanley M.D.   On: 03/15/2022 12:07    Assessment/Plan: 4 Days Post-Op   Principal Problem:   Fall Active Problems:   HTN (hypertension)   GERD (gastroesophageal reflux disease)   Depression   Hypokalemia   Chronic obstructive pulmonary disease (HCC)   Diabetes mellitus (HCC)   Seropositive rheumatoid arthritis (HCC)   Seizure (HCC)   Ankle fracture, right   Acute on chronic diastolic CHF (congestive heart failure) (HCC)   AMS (altered mental status)   Pancytopenia (HCC)   Fever  -PT/OT: Nonweightbearing x 6 to 8 weeks postop -Continue Lovenox for DVT prophylaxis, will need for 4 weeks postop -Keep AO splint in place, clean and dry until follow-up -Patient will need wheelchair with right leg support as well as bedside commode after discharge -Anticipate discharge to [facility] facility, per Medical/Primary Team -Follow-up with Dr. Mack Guise outpatient in 2 weeks     Roland Rack , PA-C 03/16/2022, 7:39 AM

## 2022-03-20 LAB — CULTURE, BLOOD (ROUTINE X 2)
Culture: NO GROWTH
Culture: NO GROWTH
Special Requests: ADEQUATE
Special Requests: ADEQUATE

## 2022-03-23 ENCOUNTER — Encounter: Payer: Self-pay | Admitting: Nurse Practitioner

## 2022-03-23 ENCOUNTER — Non-Acute Institutional Stay: Payer: Medicare Other | Admitting: Nurse Practitioner

## 2022-03-23 VITALS — BP 122/64 | HR 68 | Temp 98.2°F | Resp 18 | Wt 142.9 lb

## 2022-03-23 DIAGNOSIS — R0602 Shortness of breath: Secondary | ICD-10-CM

## 2022-03-23 DIAGNOSIS — I509 Heart failure, unspecified: Secondary | ICD-10-CM

## 2022-03-23 DIAGNOSIS — Z515 Encounter for palliative care: Secondary | ICD-10-CM

## 2022-03-23 NOTE — Progress Notes (Signed)
[facility] Consult Note Telephone: [phone]  Fax: [phone]    Date of encounter: 03/23/22 3:37 PM PATIENT NAME: [place], [place]  [id number]   [phone] (home)  DOB: 1946-05-04 MRN: [id number] PRIMARY CARE PROVIDER:    [place]:    Contact Information     Name Relation Home Work [place], [place] Relative   [phone]   Azzie Glatter   [place], [place] Daughter   [phone]     I met face to face with patient in facility. Palliative Care was asked to follow this patient by consultation request of  [facility] to address advance care planning and complex medical decision making. This is a follow up visit.                                  ASSESSMENT AND PLAN / RECOMMENDATIONS:  Symptom Management/Plan: 1. ACP: Medical goals consists DNR, do not intubate. Wishes are for more conservative care to include antibiotics, IV fluids, blood transfusions, diagnostic testing, lab testing. Wishes are to minimize hospitalizations if necessary.    2. Shortness of breath stable secondary to congestive heart failure/COPD Continue daily weights and outpatient Cardiology appointments, discussed nebulizers with inhalation therapy.   3. Palliative care encounter; Palliative medicine team will continue to support patient, patient's family, and medical team. Visit consisted of counseling and education dealing with the complex and emotionally intense issues of symptom management.   4. Pain secondary to fractures, discussed on pain scale, continue current pain management and continue to follow at pain clinic   5. F/u 2 weeks for ongoing monitoring symptoms, pain disease progression,   Follow up Palliative Care Visit: Palliative care will continue to follow for complex medical decision making, advance care planning, and clarification  of goals. Return 4 weeks or prn.   I spent 47 minutes providing this consultation at 2:05 pm. More than 50% of the time in this consultation was spent in counseling and care coordination.   PPS: 50%   Chief Complaint: Follow up palliative consult for complex medical decision making   HISTORY OF PRESENT ILLNESS:  Meghan [name] is a 76 y.o. year old female  with  multiple medical problems including COPD, congestive heart failure with ef 20%, asthma, collagen vascular disease, coronary artery disease, heart murmur, benign brain tumor, hypertension, gerd, hypercholesterolemia, history of headache, diabetes, gerd, arthritis, osteoporosis, lumbar degenerative disc disease, anxiety, PTSD, depression, kyphoplasty, right hand reconstruction 2011, cholecystectomy, cervical fusion, abdominal hysterectomy. Ms Meghan [name] continues to reside in [place] at [facility] center. Hospitalized 03/11/2022 to 03/16/2022 following a fall with right ankle fx s/p ORIF right tibia and fibula fractures. Ms. Meghan [name] was stabilized and d/c back to [facility] where she resides LTC. Ms. Meghan [name] is bedbound-requires assistance for adl's. Ms. Meghan [name] is feeding herself, appetite is improving. At present Ms. Meghan [name] is lying in bed, playing games on her tablet. We talked about recent hospitalization, ros, pain management. We talked about goals with right fx's, PT/OT. We talked about appetite at length, decrease mobility, quality of life, medical goals. We talked about residing at facility, challenges on relying on others, loss of independence. We talked about role pc in poc. Therapeutic listening, emotional support provided. Questions answered, updated staff. Most PC visit supportive, Ms. Meghan [name] was appreciative of  visit. We talked about f/u PC visit and updated staff.   History obtained from review of EMR, discussion with facility staff and Ms. Meghan [name].  I reviewed available labs, medications, imaging, studies and related  documents from the EMR.  Records reviewed and summarized above.    ROS 10 point system reviewed all negative except HPI   Physical Exam: Constitutional: NAD General: chronically ill, pleasant female EYES:  lids intact ENMT: oral mucous membranes moist CV: S1S2, RRR Pulmonary: LCTA, no increased work of breathing, no cough, room air Abdomen: soft and non tender MSK: cast to right lower leg, +pulse Skin: warm and dry Neuro:  + generalized weakness,  no cognitive impairment Psych: non-anxious affect, A and O x 3  Thank you for the opportunity to participate in the care of Ms. Meghan [name].  The palliative care team will continue to follow. Please call our office at [phone] if we can be of additional assistance.   Richardo Popoff Ihor Gully, NP

## 2022-04-20 ENCOUNTER — Ambulatory Visit (INDEPENDENT_AMBULATORY_CARE_PROVIDER_SITE_OTHER): Payer: Medicare Other

## 2022-04-20 DIAGNOSIS — I442 Atrioventricular block, complete: Secondary | ICD-10-CM

## 2022-04-21 LAB — CUP PACEART REMOTE DEVICE CHECK
Battery Remaining Longevity: 66 mo
Battery Remaining Percentage: 63 %
Battery Voltage: 2.98 V
Brady Statistic AP VP Percent: 1 %
Brady Statistic AP VS Percent: 1 %
Brady Statistic AS VP Percent: 99 %
Brady Statistic AS VS Percent: 1 %
Brady Statistic RA Percent Paced: 1 %
Brady Statistic RV Percent Paced: 99 %
Date Time Interrogation Session: 20230911020015
Implantable Lead Implant Date: 20200313
Implantable Lead Implant Date: 20200313
Implantable Lead Location: 753859
Implantable Lead Location: 753860
Implantable Lead Model: 5076
Implantable Lead Model: 5076
Implantable Pulse Generator Implant Date: 20200313
Lead Channel Impedance Value: 390 Ohm
Lead Channel Impedance Value: 490 Ohm
Lead Channel Pacing Threshold Amplitude: 0.25 V
Lead Channel Pacing Threshold Amplitude: 0.5 V
Lead Channel Pacing Threshold Pulse Width: 0.5 ms
Lead Channel Pacing Threshold Pulse Width: 0.5 ms
Lead Channel Sensing Intrinsic Amplitude: 12 mV
Lead Channel Sensing Intrinsic Amplitude: 2.8 mV
Lead Channel Setting Pacing Amplitude: 2 V
Lead Channel Setting Pacing Amplitude: 2.5 V
Lead Channel Setting Pacing Pulse Width: 0.5 ms
Lead Channel Setting Sensing Sensitivity: 4 mV
Pulse Gen Model: 2272
Pulse Gen Serial Number: 9118930

## 2022-05-07 NOTE — Progress Notes (Signed)
Remote pacemaker transmission.   

## 2022-06-01 ENCOUNTER — Telehealth: Payer: Self-pay | Admitting: Internal Medicine

## 2022-06-01 ENCOUNTER — Encounter: Payer: Self-pay | Admitting: Internal Medicine

## 2022-06-01 ENCOUNTER — Inpatient Hospital Stay: Payer: Medicare Other

## 2022-06-01 ENCOUNTER — Inpatient Hospital Stay: Payer: Medicare Other | Attending: Internal Medicine | Admitting: Internal Medicine

## 2022-06-01 VITALS — BP 111/60 | HR 40 | Temp 98.1°F | Ht <= 58 in | Wt 129.0 lb

## 2022-06-01 DIAGNOSIS — I110 Hypertensive heart disease with heart failure: Secondary | ICD-10-CM | POA: Diagnosis not present

## 2022-06-01 DIAGNOSIS — E119 Type 2 diabetes mellitus without complications: Secondary | ICD-10-CM | POA: Insufficient documentation

## 2022-06-01 DIAGNOSIS — K219 Gastro-esophageal reflux disease without esophagitis: Secondary | ICD-10-CM | POA: Insufficient documentation

## 2022-06-01 DIAGNOSIS — J4489 Other specified chronic obstructive pulmonary disease: Secondary | ICD-10-CM | POA: Diagnosis not present

## 2022-06-01 DIAGNOSIS — Z950 Presence of cardiac pacemaker: Secondary | ICD-10-CM | POA: Diagnosis not present

## 2022-06-01 DIAGNOSIS — Z79899 Other long term (current) drug therapy: Secondary | ICD-10-CM

## 2022-06-01 DIAGNOSIS — I509 Heart failure, unspecified: Secondary | ICD-10-CM | POA: Insufficient documentation

## 2022-06-01 DIAGNOSIS — I2510 Atherosclerotic heart disease of native coronary artery without angina pectoris: Secondary | ICD-10-CM | POA: Diagnosis not present

## 2022-06-01 DIAGNOSIS — M059 Rheumatoid arthritis with rheumatoid factor, unspecified: Secondary | ICD-10-CM

## 2022-06-01 DIAGNOSIS — M069 Rheumatoid arthritis, unspecified: Secondary | ICD-10-CM | POA: Diagnosis not present

## 2022-06-01 DIAGNOSIS — D61818 Other pancytopenia: Secondary | ICD-10-CM

## 2022-06-01 DIAGNOSIS — Z9049 Acquired absence of other specified parts of digestive tract: Secondary | ICD-10-CM | POA: Insufficient documentation

## 2022-06-01 DIAGNOSIS — E785 Hyperlipidemia, unspecified: Secondary | ICD-10-CM | POA: Insufficient documentation

## 2022-06-01 LAB — CBC WITH DIFFERENTIAL/PLATELET
Abs Immature Granulocytes: 0.01 10*3/uL (ref 0.00–0.07)
Basophils Absolute: 0 10*3/uL (ref 0.0–0.1)
Basophils Relative: 1 %
Eosinophils Absolute: 0 10*3/uL (ref 0.0–0.5)
Eosinophils Relative: 1 %
HCT: 36.5 % (ref 36.0–46.0)
Hemoglobin: 11.8 g/dL — ABNORMAL LOW (ref 12.0–15.0)
Immature Granulocytes: 0 %
Lymphocytes Relative: 16 %
Lymphs Abs: 0.7 10*3/uL (ref 0.7–4.0)
MCH: 30.6 pg (ref 26.0–34.0)
MCHC: 32.3 g/dL (ref 30.0–36.0)
MCV: 94.8 fL (ref 80.0–100.0)
Monocytes Absolute: 0.3 10*3/uL (ref 0.1–1.0)
Monocytes Relative: 7 %
Neutro Abs: 3.1 10*3/uL (ref 1.7–7.7)
Neutrophils Relative %: 75 %
Platelets: 156 10*3/uL (ref 150–400)
RBC: 3.85 MIL/uL — ABNORMAL LOW (ref 3.87–5.11)
RDW: 14.7 % (ref 11.5–15.5)
WBC: 4.1 10*3/uL (ref 4.0–10.5)
nRBC: 0 % (ref 0.0–0.2)

## 2022-06-01 LAB — IRON AND TIBC
Iron: 84 ug/dL (ref 28–170)
Saturation Ratios: 34 % — ABNORMAL HIGH (ref 10.4–31.8)
TIBC: 249 ug/dL — ABNORMAL LOW (ref 250–450)
UIBC: 165 ug/dL

## 2022-06-01 LAB — HEPATITIS B CORE ANTIBODY, IGM: Hep B C IgM: NONREACTIVE

## 2022-06-01 LAB — FOLATE: Folate: 11.6 ng/mL (ref >5.9)

## 2022-06-01 LAB — VITAMIN B12: Vitamin B-12: 974 pg/mL — ABNORMAL HIGH (ref 180–914)

## 2022-06-01 LAB — HEPATITIS C ANTIBODY: HCV Ab: NONREACTIVE

## 2022-06-01 LAB — HEPATITIS B SURFACE ANTIGEN: Hepatitis B Surface Ag: NONREACTIVE

## 2022-06-01 LAB — FERRITIN: Ferritin: 124 ng/mL (ref 11–307)

## 2022-06-01 NOTE — Telephone Encounter (Signed)
Attempt made to reach patient to notify her about [place] scheduled prior to her next f/u here at the [facility]. Unable to leave a message, will mail AVS today.

## 2022-06-02 NOTE — Progress Notes (Addendum)
[place]  [phone]) [phone] [phone]  ID: Meghan [name] OB: 02-28-1946  MR#: [id number]  CLE#:[id number]  Patient Care Team: Garwin Brothers, MD as PCP - General (Internal Medicine) Lelon Perla, MD as Consulting Physician (Cardiology)  REFERRING PROVIDER: Philippa Sicks, NP  REASON FOR REFERRAL: pancytopenia  HPI: Meghan [name] is a 76 y.o. female with past medical history of CHF, GERD, rheumatoid arthritis on Xeljanz, diabetes, hypertension, hyperlipidemia, COPD, CAD, complete heart block status post pacemaker, depression, kyphoplasty, cholecystectomy, osteoporosis and right ankle fracture status post ORIF right tibia and fibula fracture in August 2023 who resides in skilled long-term care facility at [facility] was referred to hematology for work-up of pancytopenia.  Patient has diagnosis of rheumatoid arthritis for several years.  She follows with Dr. Eugene Gavia at [place] [facility].  Previously she has been on methotrexate, steroids, Humira, Enbrel and Remicade.  She is on [place] for the past 2 years.  She also gets Prolia for osteoporosis.  In August 2023 patient was admitted after mechanical fall.  She was found to have right ankle fracture and underwent ORIF right tibia and fibular fracture by Dr. Mack Guise.  She also had low-grade fever which was thought from atelectasis.  She reported fall about 3 weeks ago and has swelling in her right leg and pain.  Denies improvement in her symptoms.  Reports weight loss of about 30 pounds in past 3 months.  She has appetite but does not enjoy the food that is provided at the facility.  She denies any fevers, chills, nausea, vomiting, abdominal pain, bleeding, shortness of bowel or bladder issues.  Labs reviewed. In August 2023, WBC 3.5, hemoglobin around 7-8 and platelets and low 100s.  Normal LFTs.    REVIEW OF SYSTEMS:   ROS  As per HPI. Otherwise, a complete review of systems is  negative.  PAST MEDICAL HISTORY: Past Medical History:  Diagnosis Date   Anginal pain ([place])    Anxiety    Arthritis    RA   Asthma    Brain tumor (benign) (HCC)    CHF (congestive heart failure) (HCC)    Collagen vascular disease (HCC)    Complete heart block (HCC)    COPD (chronic obstructive pulmonary disease) (HCC)    Coronary artery disease    DDD (degenerative disc disease)    Depression    Diabetes mellitus    GERD (gastroesophageal reflux disease)    Headache    Heart murmur    Hypercholesteremia    Hypertension    Lumbar degenerative disc disease    Migraines    Obesity    Osteopenia    Osteoporosis    Pacemaker- [employer]    Pneumonia    PTSD (post-traumatic stress disorder)     PAST SURGICAL HISTORY: Past Surgical History:  Procedure Laterality Date   ABDOMINAL HYSTERECTOMY     CERVICAL FUSION     CHOLECYSTECTOMY     COLONOSCOPY WITH PROPOFOL N/A 09/20/2015   Procedure: COLONOSCOPY WITH PROPOFOL;  Surgeon: Josefine Class, MD;  Location: [facility] ENDOSCOPY;  Service: Endoscopy;  Laterality: N/A;   COLONOSCOPY WITH PROPOFOL N/A 01/27/2016   Procedure: COLONOSCOPY WITH PROPOFOL;  Surgeon: Manya Silvas, MD;  Location: [facility] ENDOSCOPY;  Service: Endoscopy;  Laterality: N/A;   FINGER ARTHROPLASTY  02/23/2012   Procedure: FINGER ARTHROPLASTY;  Surgeon: Cammie Sickle., MD;  Location: [place];  Service: Orthopedics;  Laterality: Left;  Extensor carpi radialis longus  to Extensor carpi ulnaris transfer, left Metaphalangeal reconstructions of index and long fingers,   FOOT ARTHROPLASTY     toes x2 rt foot   HAND RECONSTRUCTION  2011   right-multiple finger joint reconst   JOINT REPLACEMENT     bilat knee replacements   KYPHOPLASTY N/A 06/14/2017   Procedure: KYPHOPLASTY L1;  Surgeon: Hessie Knows, MD;  Location: [facility] ORS;  Service: Orthopedics;  Laterality: N/A;   LOOP RECORDER INSERTION N/A 06/23/2017   Procedure: LOOP RECORDER INSERTION;   Surgeon: Isaias Cowman, MD;  Location: [place] [facility];  Service: Cardiovascular;  Laterality: N/A;   LOOP RECORDER REMOVAL N/A 10/21/2018   Procedure: LOOP RECORDER REMOVAL;  Surgeon: Deboraha Sprang, MD;  Location: [place] [facility];  Service: Cardiovascular;  Laterality: N/A;   OPEN REDUCTION INTERNAL FIXATION (ORIF) TIBIA/FIBULA FRACTURE Right 03/12/2022   Procedure: OPEN REDUCTION INTERNAL FIXATION (ORIF) TIBIA/FIBULA FRACTURE;  Surgeon: Thornton Park, MD;  Location: [facility] ORS;  Service: Orthopedics;  Laterality: Right;   ORIF FEMUR FRACTURE Right 10/22/2018   Procedure: OPEN REDUCTION INTERNAL FIXATION (ORIF) DISTAL FEMUR FRACTURE;  Surgeon: Marchia Bond, MD;  Location: [place];  Service: Orthopedics;  Laterality: Right;   PACEMAKER IMPLANT N/A 10/21/2018   Procedure: PACEMAKER IMPLANT;  Surgeon: Deboraha Sprang, MD;  Location: [place] [facility];  Service: Cardiovascular;  Laterality: N/A;    FAMILY HISTORY: Family History  Problem Relation Age of Onset   Depression Sister    Migraines Sister    Hypertension Mother    Arthritis/Rheumatoid Mother    Heart attack Father    Hypertension Father    CAD Other    Hypertension Other    Diabetes Mellitus II Other    Arthritis Other     HEALTH MAINTENANCE: Social History   Tobacco Use   Smoking status: Never   Smokeless tobacco: Never  Vaping Use   Vaping Use: Never used  Substance Use Topics   Alcohol use: No    Alcohol/week: 0.0 standard drinks of alcohol   Drug use: No     Allergies  Allergen Reactions   Amoxicillin Itching and Other (See Comments)    Has patient had a PCN reaction causing immediate rash, facial/tongue/throat swelling, SOB or lightheadedness with hypotension: Unknown Has patient had a PCN reaction causing severe rash involving mucus membranes or skin necrosis: Unknown Has patient had a PCN reaction that required hospitalization: Unknown Has patient had a PCN reaction occurring within the last  10 years: Unknown If all of the above answers are "NO", then may proceed with Cephalosporin use.    Gabapentin Other (See Comments)    Pt states that it causes her BP to drop.    Iodine Other (See Comments)    Reaction:  Syncopy   Naproxen Hives   Nsaids Other (See Comments)    Reaction:  Unknown    Tolmetin Other (See Comments)    Reaction:  Unknown     Current Outpatient Medications  Medication Sig Dispense Refill   acetaminophen (TYLENOL) 500 MG tablet Take 2 tablets (1,000 mg total) by mouth every 8 (eight) hours as needed for headache, fever, moderate pain or mild pain. 30 tablet 0   albuterol (VENTOLIN HFA) 108 (90 Base) MCG/ACT inhaler Inhale 1-2 puffs into the lungs every 6 (six) hours as needed for wheezing or shortness of breath.     budesonide (PULMICORT) 0.5 MG/2ML nebulizer solution Inhale 2 mLs into the lungs daily.     busPIRone (BUSPAR) 10 MG  tablet Take 10 mg by mouth 2 (two) times daily.     Calcium Carb-Cholecalciferol (CALCIUM 500/D) 500-400 MG-UNIT CHEW Chew by mouth daily.     calcium carbonate (OS-CAL - DOSED IN MG OF ELEMENTAL CALCIUM) 1250 (500 Ca) MG tablet Take 2 tablets by mouth every 2 (two) hours as needed.     carboxymethylcellul-glycerin (REFRESH OPTIVE) 0.5-0.9 % ophthalmic solution Place 1 drop into both eyes at bedtime.     Cholecalciferol 125 MCG (5000 UT) capsule Take 5,000 Units by mouth daily.     diclofenac sodium (VOLTAREN) 1 % GEL Apply 2 g topically 3 (three) times daily. Apply to the fingers     enoxaparin (LOVENOX) 40 MG/0.4ML injection Inject 0.4 mLs (40 mg total) into the skin daily. 0 mL    escitalopram (LEXAPRO) 20 MG tablet Take 20 mg by mouth daily.     famotidine (PEPCID) 40 MG tablet Take 40 mg by mouth daily.     ferrous sulfate 325 (65 FE) MG tablet Take 325 mg by mouth daily with breakfast.     furosemide (LASIX) 40 MG tablet Take 1 tablet (40 mg total) by mouth daily. 30 tablet    hydrOXYzine (ATARAX) 25 MG tablet Take 25 mg by  mouth 3 (three) times daily as needed (pruitus).     lamoTRIgine (LAMICTAL) 100 MG tablet Take 1 tablet (100 mg total) by mouth daily. 90 tablet 3   loratadine (CLARITIN) 10 MG tablet Take 10 mg by mouth daily.     LYRICA 100 MG capsule Take 100 mg by mouth 2 (two) times daily.     mirtazapine (REMERON) 15 MG tablet Take 15 mg by mouth at bedtime.     NYAMYC powder Apply 1 Application topically 2 (two) times daily.     Polyethyl Glycol-Propyl Glycol 0.4-0.3 % SOLN Place 1 drop into both eyes at bedtime.     polyethylene glycol (MIRALAX / GLYCOLAX) 17 g packet Take 17 g by mouth daily as needed for mild constipation. 14 each 0   Potassium Chloride ER 20 MEQ TBCR Take 20 mEq by mouth daily.  0   pramipexole (MIRAPEX) 0.25 MG tablet Take 0.25 mg by mouth daily.      pravastatin (PRAVACHOL) 20 MG tablet Take 1 tablet by mouth 3 (three) times a week. Monday, Wednesday, and Friday     Tofacitinib Citrate (XELJANZ) 5 MG TABS Take 1 tablet by mouth 2 (two) times daily.     topiramate (TOPAMAX) 25 MG tablet Take 25 mg by mouth at bedtime.      docusate sodium (COLACE) 100 MG capsule Take 1 capsule (100 mg total) by mouth 2 (two) times daily as needed for mild constipation. (Patient not taking: Reported on 06/01/2022) 10 capsule 0   ergocalciferol (VITAMIN D2) 1.25 MG (50000 UT) capsule Take 1 capsule (50,000 Units total) by mouth 2 (two) times a week. X 6 weeks. 12 capsule 0   No current facility-administered medications for this visit.    OBJECTIVE: Vitals:   06/01/22 1111  BP: 111/60  Pulse: (!) 40  Temp: 98.1 F (36.7 C)     Body mass index is 29.98 kg/m.      General: Well-developed, well-nourished, no acute distress. Eyes: Pink conjunctiva, anicteric sclera. HEENT: Normocephalic, moist mucous membranes, clear oropharnyx. Lungs: Clear to auscultation bilaterally. Heart: Regular rate and rhythm. No rubs, murmurs, or gallops. Abdomen: Soft, nontender, nondistended. No organomegaly noted,  normoactive bowel sounds. Musculoskeletal: Right leg swelling Neuro: Alert, answering all questions  appropriately. Cranial nerves grossly intact. Skin: No rashes or petechiae noted. Psych: Normal affect. Lymphatics: No cervical, calvicular, axillary or inguinal LAD. Chronic arthritis joint changes both hands  LAB RESULTS:  Lab Results  Component Value Date   NA 143 03/14/2022   K 3.9 03/16/2022   CL 108 03/14/2022   CO2 29 03/14/2022   GLUCOSE 88 03/14/2022   BUN 19 03/14/2022   CREATININE 0.64 03/16/2022   CALCIUM 8.5 (L) 03/14/2022   PROT 5.7 (L) 03/11/2022   ALBUMIN 3.4 (L) 03/11/2022   AST 18 03/11/2022   ALT 14 03/11/2022   ALKPHOS 42 03/11/2022   BILITOT 0.8 03/11/2022   GFRNONAA >60 03/16/2022   GFRAA >60 10/24/2018    Lab Results  Component Value Date   WBC 4.1 06/01/2022   NEUTROABS 3.1 06/01/2022   HGB 11.8 (L) 06/01/2022   HCT 36.5 06/01/2022   MCV 94.8 06/01/2022   PLT 156 06/01/2022    Lab Results  Component Value Date   TIBC 249 (L) 06/01/2022   FERRITIN 124 06/01/2022   IRONPCTSAT 34 (H) 06/01/2022     STUDIES: No results found.  ASSESSMENT AND PLAN:   Meghan [name] is a 76 y.o. female with pmh of past medical history of CHF, GERD, rheumatoid arthritis on Xeljanz, diabetes, hypertension, hyperlipidemia, COPD, CAD, complete heart block status post pacemaker, depression, kyphoplasty, cholecystectomy, osteoporosis and right ankle fracture status post ORIF right tibia and fibula fracture in August 2023 who resides in skilled long-term care facility at [facility] was referred to hematology for work-up of pancytopenia.  #Pancytopenia #Rheumatoid arthritis on Xeljanz -Progressive of unclear etiology -Labs reviewed. In August 2023, WBC 3.5, hemoglobin around 7-8 and platelets low 100s.  Normal LFTs. -Labs as below.  Rule out nutritional deficiency and hepatitis.   -Will obtain flow cytometry to rule out LGL with her history of  rheumatoid arthritis.  We will obtain ultrasound abdomen to assess for liver and spleen. -Repeat CBC with differential today -Medication list reviewed.  Patient is on multiple medications.  Topiramate and lamotrigine can rarely cause anemia and leukopenia.  However patient is on that medication for several years.  So less likely to be medication induced.  Of chart, patient had fall in August 2023 and had surgery of the right ankle.  Patient mentioned to me that she had fall 3 weeks ago and developed swelling which has not improved.  In the consultation note from the facility, I advised doing right lower extremity Doppler and right ankle x-ray.  Will defer to the primary.  Orders Placed This Encounter  Procedures   US Abdomen Complete   CBC with Differential   Iron and TIBC([facility])   Ferritin   Vitamin B12   Folate   Flow cytometry panel-leukemia/lymphoma work-up   Hepatitis C antibody   Hepatitis B surface antigen   Hepatitis B core antibody, IgM   RTC in 3 weeks for MD visit to discuss labs.  Patient expressed understanding and was in agreement with this plan. She also understands that She can call clinic at any time with any questions, concerns, or complaints.   I spent a total of 45 minutes reviewing chart data, face-to-face evaluation with the patient, counseling and coordination of care as detailed above.  Jane Canary, MD   06/02/2022 9:28 AM

## 2022-06-03 LAB — COMP PANEL: LEUKEMIA/LYMPHOMA

## 2022-06-05 ENCOUNTER — Ambulatory Visit: Admission: RE | Admit: 2022-06-05 | Payer: Medicare Other | Source: Ambulatory Visit

## 2022-06-08 ENCOUNTER — Non-Acute Institutional Stay: Payer: Medicare Other | Admitting: Nurse Practitioner

## 2022-06-08 ENCOUNTER — Encounter: Payer: Self-pay | Admitting: Nurse Practitioner

## 2022-06-08 VITALS — BP 128/72 | HR 87 | Temp 97.8°F | Resp 18 | Wt 138.0 lb

## 2022-06-08 DIAGNOSIS — R0602 Shortness of breath: Secondary | ICD-10-CM

## 2022-06-08 DIAGNOSIS — K529 Noninfective gastroenteritis and colitis, unspecified: Secondary | ICD-10-CM

## 2022-06-08 DIAGNOSIS — R110 Nausea: Secondary | ICD-10-CM

## 2022-06-08 DIAGNOSIS — Z515 Encounter for palliative care: Secondary | ICD-10-CM

## 2022-06-08 DIAGNOSIS — I509 Heart failure, unspecified: Secondary | ICD-10-CM

## 2022-06-08 NOTE — Progress Notes (Signed)
[facility] Consult Note Telephone: [phone]  Fax: [phone]    Date of encounter: 06/08/22 4:12 PM [name] [id number]   [phone] (home)  DOB: 09-18-1945 MRN: [id number] PRIMARY CARE PROVIDER:    [place]:    Contact Information     Name Relation Home Work [place], [place] Relative   [phone]   Azzie Glatter   [phone]   Dereck Leep Daughter   [phone]         I met face to face with patient in facility. Palliative Care was asked to follow this patient by consultation request of  [place] to address advance care planning and complex medical decision making. This is a follow up visit.                                  ASSESSMENT AND PLAN / RECOMMENDATIONS:  Symptom Management/Plan: 1. ACP: Medical goals consists DNR, do not intubate. Wishes are for more conservative care to include antibiotics, IV fluids, blood transfusions, diagnostic testing, lab testing. Wishes are to minimize hospitalizations if necessary.    2. Shortness of breath stable secondary to congestive heart failure/COPD Continue daily weights and outpatient Cardiology appointments, discussed nebulizers with inhalation therapy.  05/12/2022 WBC 2.6; hgb 10.4; hct 31.5; platelets 114   3. Palliative care encounter; Palliative medicine team will continue to support patient, patient's family, and medical team. Visit consisted of counseling and education dealing with the complex and emotionally intense issues of symptom management.  4. Chronic nausea; discussed with Meghan [name], resolved, appetite has improved, nutrition discussed and educated  5. Chronic diarrhea; discussed with Meghan [name], resolved with intermit episodes taking lomotil with success. Meghan [name] endorses improved quality of life not having chronic diarrhea.     6. F/u 2 months for ongoing monitoring symptoms, pain disease progression,    Follow up Palliative Care Visit: Palliative care will continue to follow for complex medical decision making, advance care planning, and clarification of goals. Return 4 weeks or prn.   I spent 45 minutes providing this consultation at 12:15 pm. More than 50% of the time in this consultation was spent in counseling and care coordination.   PPS: 50%   Chief Complaint: Follow up palliative consult for complex medical decision making   HISTORY OF PRESENT ILLNESS:  Meghan [name] is a 76 y.o. year old female  with  multiple medical problems including COPD, congestive heart failure with ef 20%, asthma, collagen vascular disease, coronary artery disease, heart murmur, benign brain tumor, hypertension, gerd, hypercholesterolemia, history of headache, diabetes, gerd, arthritis, osteoporosis, lumbar degenerative disc disease, anxiety, PTSD, depression, kyphoplasty, right hand reconstruction 2011, cholecystectomy, cervical fusion, abdominal hysterectomy. Meghan [name] continues to reside in [place] at [facility] center. Hospitalized 03/11/2022 to 03/16/2022 following a fall with right ankle fx s/p ORIF right tibia and fibula fractures. Meghan [name] was stabilized and d/c back to [facility] where she resides LTC. Meghan [name] does require assistance with ADL's, transfers though is mobile in w/c. Meghan [name] has been participating in therapy, becoming very active at the facility. Meghan [name] appetite has improved with chronic nausea and chronic diarrhea also improved. Staff endorses Meghan [name] mood has improved, more interactive. No other changes. At present Meghan [name] is sitting in the  w/c in her room. Meghan [name] and I talked about role pc, Meghan [name] in agreement. We talked about ros, including chronic nausea/diarrhea at length with resolved. We talked about appetite, weight, nutrition. We talked about healing of her fracture.  We talked about daily routine, functional abilities. We talked about quality of life, no recent exacerbations. Medical goals reviewed. Talked about residing at facility, [facility]. No new changes recommended, continue current plan. Updated staff, continue to follow. Support provided.    History obtained from review of EMR, discussion with facility staff and Meghan [name].  I reviewed available labs, medications, imaging, studies and related documents from the EMR.  Records reviewed and summarized above.    ROS 10 point system reviewed all negative except HPI   Physical Exam: Constitutional: NAD General: pleasant female EYES:  lids intact ENMT: oral mucous membranes moist CV: S1S2, RRR Pulmonary: LCTA, no increased work of breathing, no cough, room air Abdomen: soft and non tender MSK: cast to right lower leg, +pulse Skin: warm and dry Neuro:  + generalized weakness,  no cognitive impairment Psych: non-anxious affect, A and O x 3 Thank you for the opportunity to participate in the care of Meghan [name].  The palliative care team will continue to follow. Please call our office at [phone] if we can be of additional assistance.   Meghan [name] Ihor Gully, NP

## 2022-06-15 IMAGING — CT CT CERVICAL SPINE W/O CM
2 of 4 series · 7 of 33 positions shown, 8 images · IV contrast (agent unspecified)
Comparison: Same day radiographs, CT head 10/20/2018, MR thoracic
and lumbar spine 01/29/2020, CT abdomen pelvis 05/10/2017 CT angio
chest 02/24/2017

CLINICAL DATA: Fall from bed

EXAM:
CT HEAD WITHOUT CONTRAST
CT CERVICAL SPINE WITHOUT CONTRAST
CT CHEST, ABDOMEN AND PELVIS WITH CONTRAST
TECHNIQUE: Contiguous axial images were obtained from the base of the skull
through the vertex without intravenous contrast.

[Series 5: coronal bone · coronal · 0.28mm/px · 3 of 59 slices shown]
[im 12/59  bone]
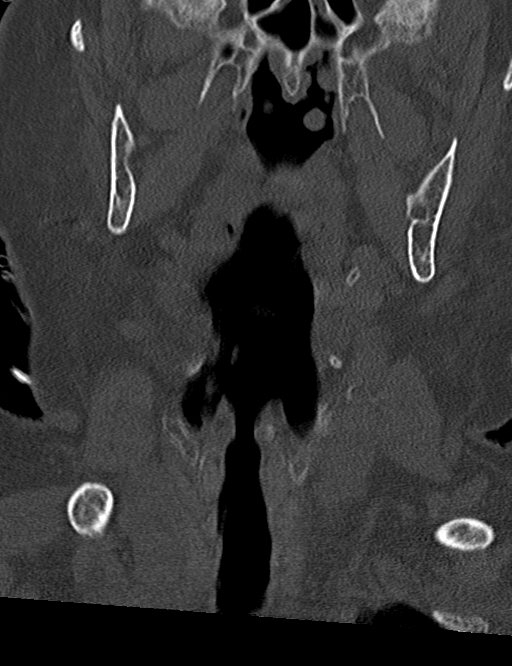
[im 24/59  bone]
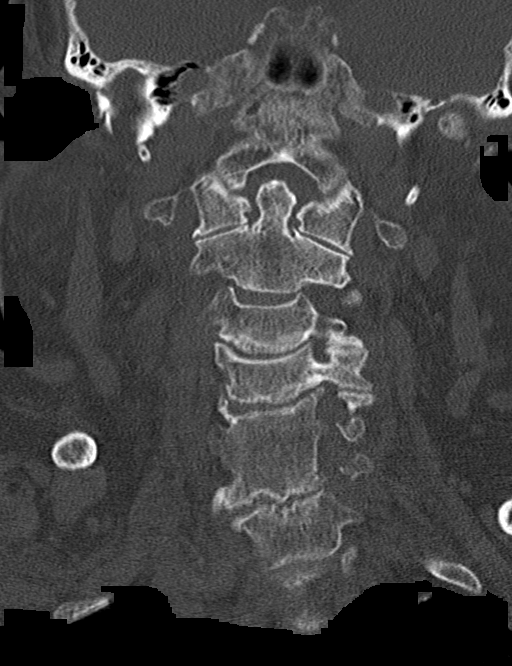
[im 35/59  bone]
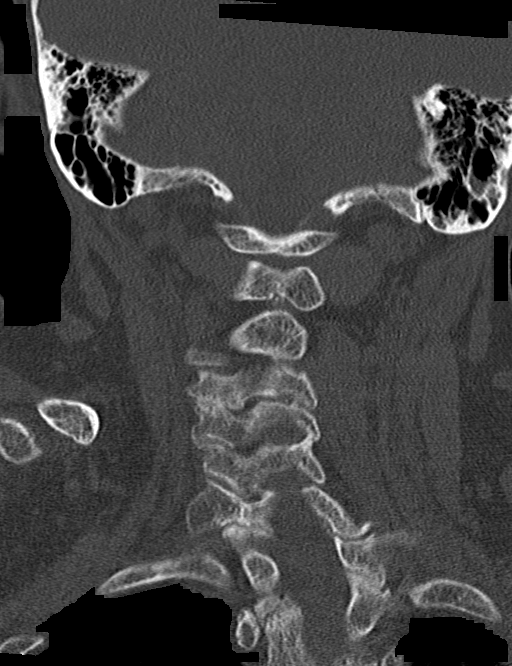

[Series 6: orthogonal bone · axial · 0.23mm/px · z∈[-270,-152]mm · 4 of 94 slices shown, 5 images]
[im 16/94  soft-tissue]
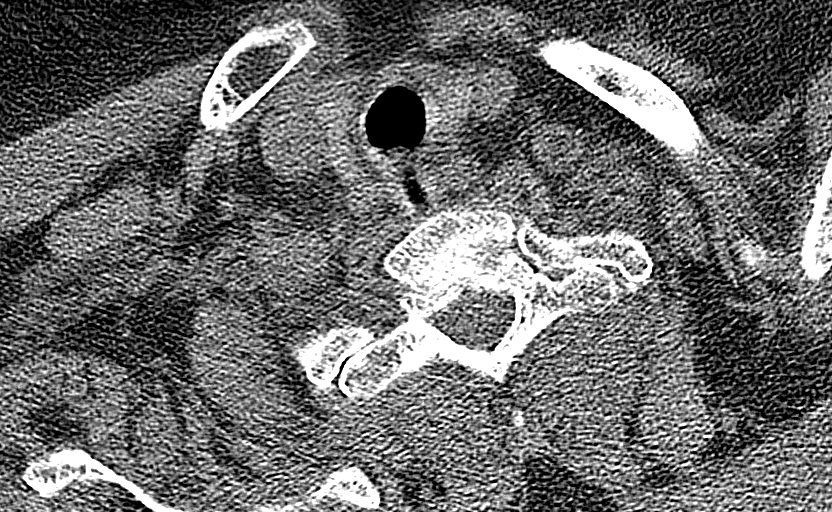
[im 16/94  bone]
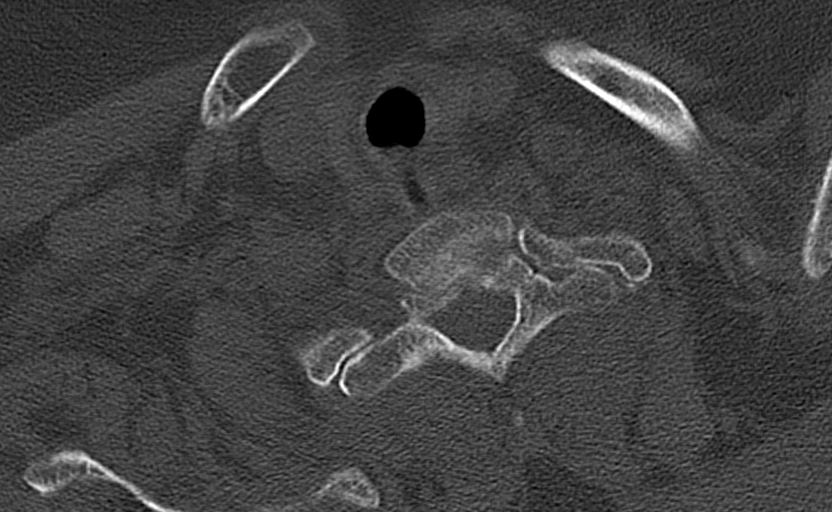
[im 32/94  bone]
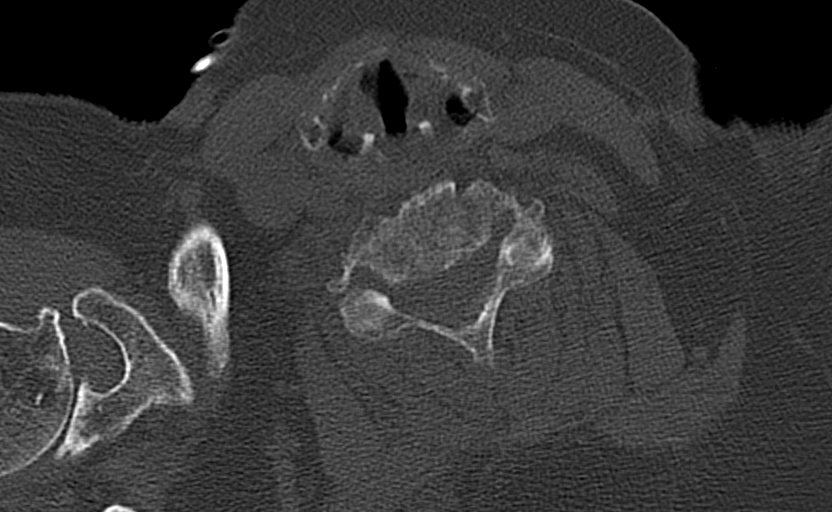
[im 63/94  bone]
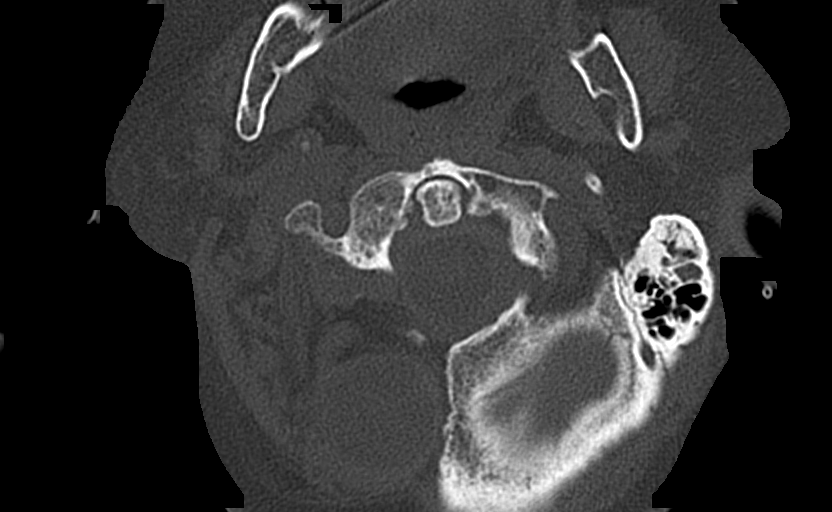
[im 78/94  bone]
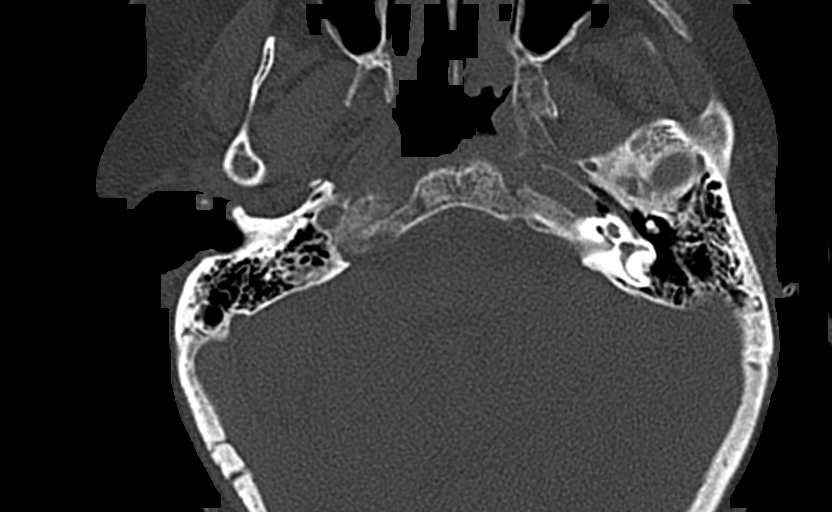

[7 of 33 positions shown; findings below may reference images not displayed]

Multidetector CT imaging of the cervical spine was performed without
intravenous contrast. Multiplanar CT image reconstructions were also
generated.

Multidetector CT imaging of the chest, abdomen and pelvis was
performed following the standard protocol during bolus
administration of intravenous contrast.

CONTRAST:  100mL OMNIPAQUE IOHEXOL 300 MG/ML  SOLN
FINDINGS: CT HEAD FINDINGS

Brain: Right cerebellar encephalomalacia subjacent to a suboccipital
craniectomy site compatible prior mass resection. No evidence of
acute infarction, hemorrhage, hydrocephalus, extra-axial collection,
new visible mass lesion or mass effect. Symmetric prominence of the
ventricles, cisterns and sulci compatible with parenchymal volume
loss. Patchy areas of white matter hypoattenuation are most
compatible with chronic microvascular angiopathy.

Vascular: Atherosclerotic calcification of the carotid siphons and
intradural vertebral arteries. No hyperdense vessel.

Skull: No calvarial fracture or suspicious osseous lesion. No scalp
swelling or hematoma. Postsurgical changes of prior right
suboccipital craniectomy without acute complication or significant
interval change.

Sinuses/Orbits: Anterior nasal septal perforation. Minimal
thickening in the ethmoids and maxillary sinuses. No layering
air-fluid levels or pneumatized secretions. Small bilateral mastoid
effusions. Middle ear cavities are clear.

Other: None.

CT CERVICAL FINDINGS

Alignment: Stabilization collar absent. Straightening of normal
cervical lordosis. Mild leftward lateral flexion. Leftward cranial
rotation as well. No evidence of traumatic listhesis. No abnormally
widened, perched or jumped facets. Normal alignment of the
craniocervical and atlantoaxial articulations. Degenerative
anterolisthesis C4 on C5 and C7 on T1. No evidence of traumatic
listhesis. No abnormally widened, perched or jumped facets. Normal
alignment of the craniocervical and atlantoaxial articulations.

Skull base and vertebrae: No acute skull base fracture. No vertebral
body fracture or height loss. Normal bone mineralization. Bony
ankylosis of the C5-6 vertebral bodies and left articular facets.
Multilevel spondylitic changes as below. Additional arthrosis at the
atlantodental and basion dens intervals.

Soft tissues and spinal canal: No pre or paravertebral fluid or
swelling. No visible canal hematoma.

Disc levels: Multilevel intervertebral disc height loss with
spondylitic endplate changes. Slightly more pronounced disc
osteophyte complexes and posterior osseous ridging C4-5 and C5-6
respectively result in some mild canal stenosis. Multilevel uncinate
spurring and facet hypertrophic changes result in mild-to-moderate
multilevel neural foraminal narrowing most pronounced on the left at
C3-4 and bilaterally at C7-T1.

Other:  None.

CT CHEST FINDINGS

Cardiovascular: The aortic root is suboptimally assessed given
cardiac pulsation artifact. Atherosclerotic plaque within the normal
caliber aorta. No acute luminal abnormality of the imaged aorta. No
periaortic stranding or hemorrhage. Normal 3 vessel branching of the
aortic arch. Shared origin of the brachiocephalic and left common
carotid arteries. Minimal plaque in the proximal great vessels.
Cardiac size is top normal. No pericardial effusion. Calcification
of mitral annulus and aortic leaflets. Three-vessel coronary artery
atherosclerosis. Left chest wall battery pack with leads at the
right atrium and apical septum. Central pulmonary arteries are
normal caliber. No large central or lobar filling defects within
limitations of this non tailored examination of the pulmonary
arteries. No major venous abnormalities.

Mediastinum/Nodes: No mediastinal fluid or gas. No acute abnormality
of the trachea or esophagus. No worrisome mediastinal, hilar or
axillary adenopathy.Few tiny subcentimeter hypoattenuating nodules
in the bilateral thyroid glan, not likely clinically significant; no
follow-up imaging recommended (ref: [HOSPITAL]. [DATE]): 143-50).

Lungs/Pleura: Hypoventilatory changes with more dense bandlike areas
of opacity likely reflecting subsegmental atelectasis or scarring in
the lung bases. Mild bronchitic features. No acute traumatic
abnormality of the lung parenchyma. No pneumothorax or effusion.

Musculoskeletal: Remote posttraumatic deformity of the right
humerus. Superimposed chronic degenerative changes including a
high-riding appearance of the humeral head suggesting underlying
rotator cuff insufficiency. More mild degenerative change of the
left shoulder. No visible displaced rib or sternal fracture is seen
no acute fracture or traumatic listhesis of the thoracic spine. No
large body wall hematoma or suspicious soft tissue abnormalities of
the chest. Mild body wall edema.

CT ABDOMEN PELVIS FINDINGS

Hepatobiliary: No direct hepatic injury or perihepatic hematoma. No
worrisome focal liver lesions. Smooth liver surface contour. Normal
hepatic attenuation. Prior cholecystectomy slight prominence of the
biliary tree likely related to reservoir effect.

Pancreas: No direct pancreatic injury or peripancreatic hematoma. No
ductal disruption. Partial fatty replacement of the pancreas. No
pancreatic ductal dilatation or surrounding inflammatory changes.

Spleen: No direct splenic injury or perisplenic hematoma. Normal in
size. No concerning splenic lesions.

Adrenals/Urinary Tract: No adrenal hemorrhage or suspicious adrenal
lesions. No direct renal injury or perinephric hemorrhage. Kidneys
enhance and excrete normally and symmetrically without extravasation
of contrast on the excretory delayed phase imaging. Right extrarenal
pelvis is unchanged from comparison. No concerning renal mass. No
urolithiasis or hydronephrosis urinary bladder is free of traumatic
findings or other acute abnormality.

Stomach/Bowel: Distal esophagus, stomach and duodenal sweep are
unremarkable. No small bowel wall thickening or dilatation. No
evidence of obstruction. Appendix is not visualized. No focal
inflammation the vicinity of the cecum to suggest an occult
appendicitis. No colonic dilatation or wall thickening. No evidence
of mesenteric hematoma or contusion.

Vascular/Lymphatic: Atherosclerotic calcifications within the
abdominal aorta and branch vessels. No aneurysm or ectasia. No
concerning acute vascular injury or sites of active contrast
extravasation. No enlarged abdominopelvic lymph nodes.

Reproductive: Uterus is surgically absent. No concerning adnexal
lesions. 4.2 cm fluid attenuation cystic lesion in the left adnexa
(2/95), new from prior. Incompletely characterized on this exam. No
concerning right adnexal lesion.

Other: No abdominopelvic free air or fluid. No bowel containing
hernia. Minimal increase in size of a broad-based fat containing
umbilical hernia. No evidence of fat strangulation. Postsurgical
changes noted in the anterior abdomen with few metallic surgical
clips. Mild circumferential body wall edema most pronounced
posteriorly. No traumatic abdominal wall dehiscence.

Musculoskeletal: Redemonstration of a chronic L1 vertebral body
fracture with approximately 30% height loss and post vertebroplasty
changes. Levocurvature of the thoracic and lumbar spine centered at
this L1 level. Grade 1 anterolisthesis L4 on 5, L5 on S1 without
associated spondylolysis. Multilevel degenerative changes are
present in the imaged portions of the spine. Bones of the pelvis and
bony sacrum are intact and congruent. Proximal femora intact and
normally located. Postsurgical changes prior right femoral
intramedullary nail placement.
IMPRESSION: CT head

1. No acute intracranial abnormality. No significant calvarial
fracture or scalp swelling
2. Prior right suboccipital craniectomy with subjacent
encephalomalacia.
3. Trace bilateral mastoid effusions.
4. Nasal septal perforation.
5. Chronic appearing nasal bone deformities. Correlate for point
tenderness.

CT cervical spine

1. No acute fracture or traumatic listhesis of the cervical spine.
2. Multilevel cervical spondylitic changes as detailed above.
3. Bony ankylosis of the C5-6 levels.

CT chest, abdomen and pelvis

1. No acute traumatic injury to the chest, abdomen, or pelvis.
2. Remote posttraumatic deformity of the right humerus.
3. Prior compression deformity of the L1 vertebral body with
approximately 30% height loss and post vertebroplasty changes.
4. Cardiomegaly and coronary artery atherosclerosis.
5. Mild body wall edema.  Correlate with fluid status.
6. Fat containing umbilical hernia, minimally increased in size from
prior without evidence of strangulation.
7. 4.2 cm fluid attenuation cystic lesion in the left adnexa, new
from prior. Because this lesion is not adequately characterized,
prompt outpatient US is recommended for further evaluation. Note:
This recommendation does not apply to premenarchal patients and to
those with increased risk (genetic, family history, elevated tumor
markers or other high-risk factors) of ovarian cancer. Reference:
JACR [DATE]):248-254
8. Aortic Atherosclerosis (SHR66-AK0.0).

## 2022-06-15 IMAGING — CR DG SHOULDER 2+V*R*
3 series · 3 of 3 positions shown · non-contrast
Comparison: March 13, 2018

CLINICAL DATA: Status post fall.

EXAM:
RIGHT SHOULDER - 2+ VIEW

[shoulder grashey]
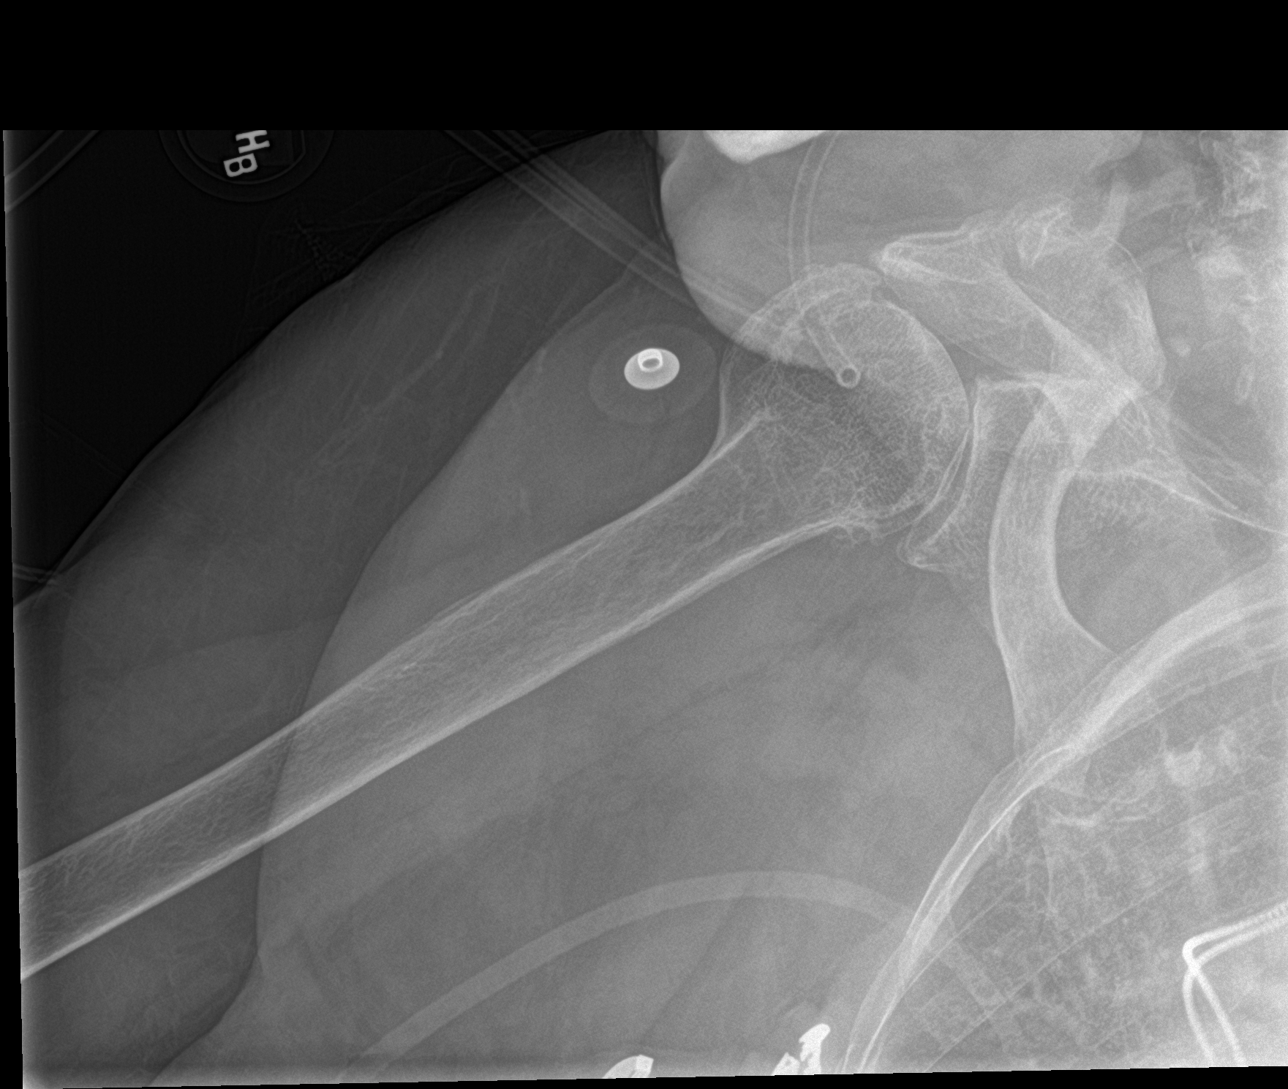

[shoulder y view]
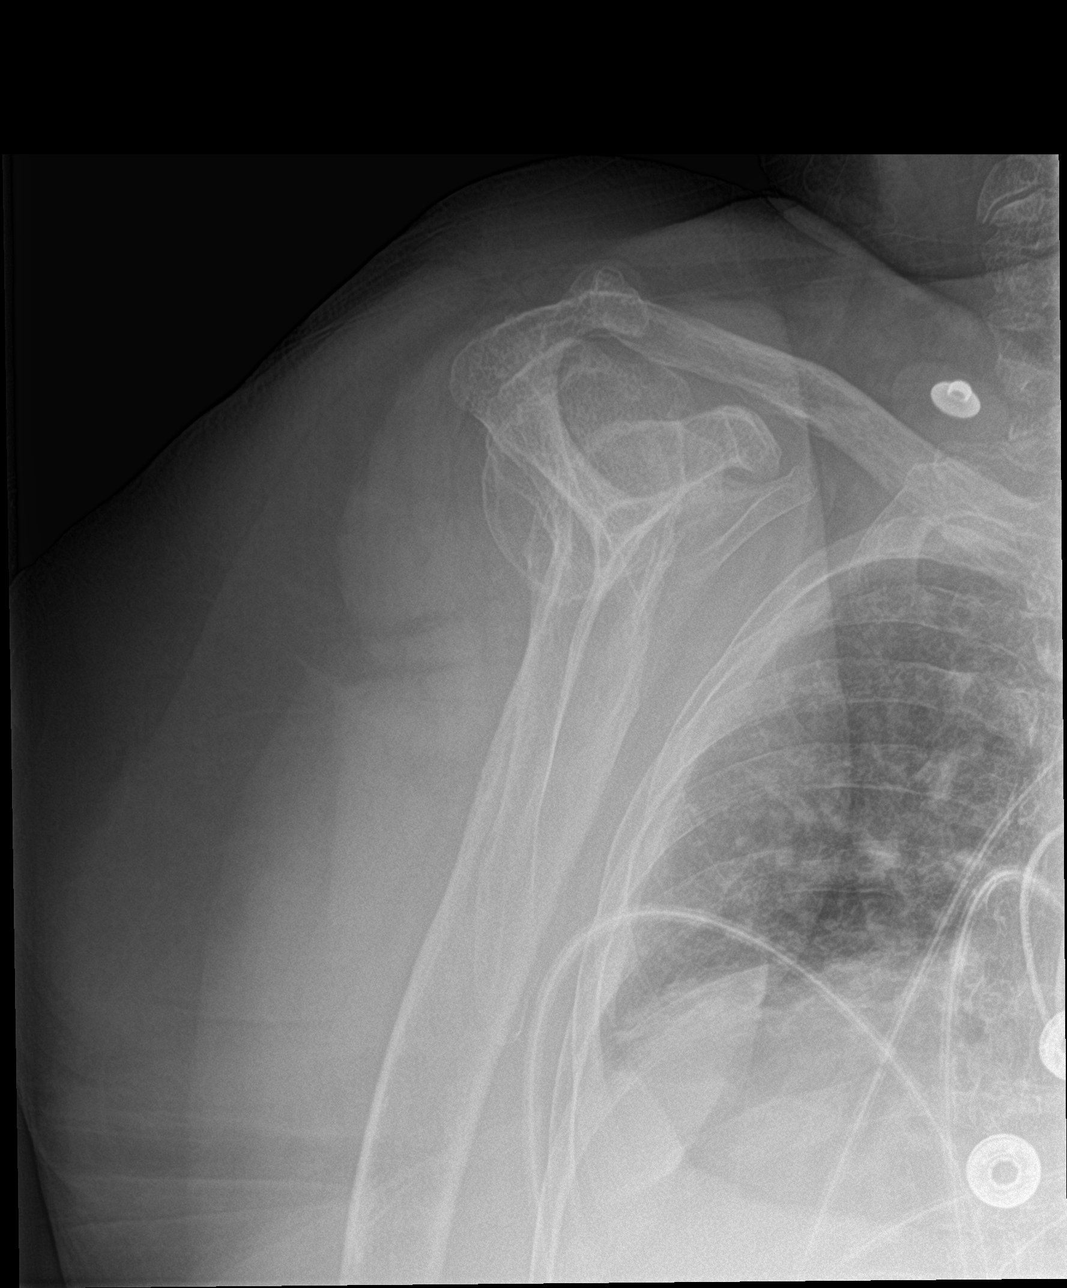

[shoulder ap neutral]
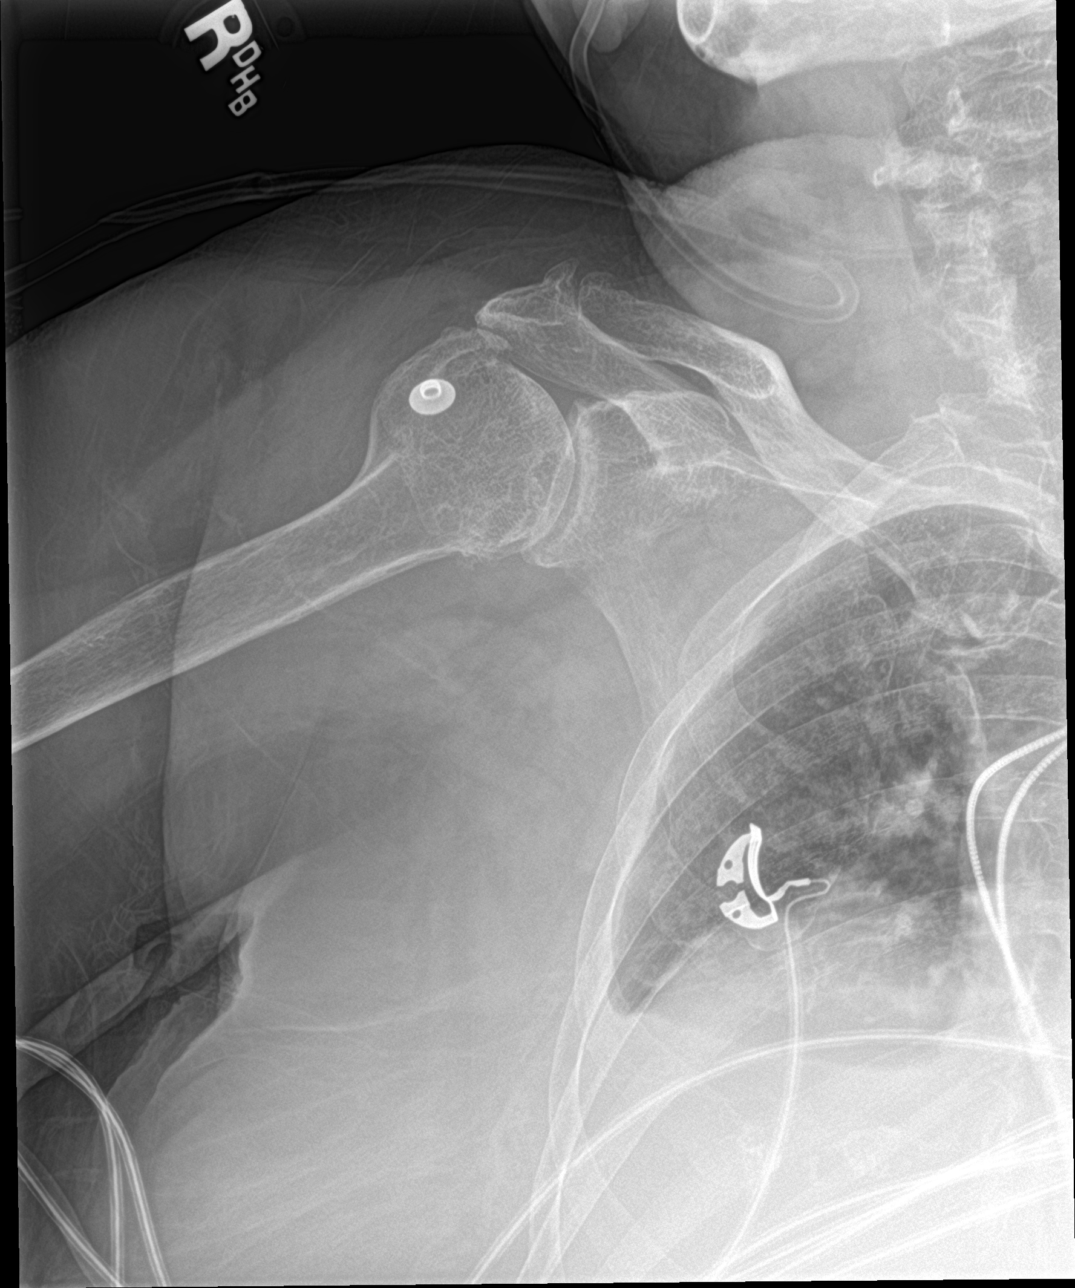

[3 of 3 positions shown; findings below may reference images not displayed]

FINDINGS: There is no evidence of an acute fracture or dislocation. A chronic
fracture deformity is seen involving the head and surgical neck of
the proximal right humerus. Degenerative changes are seen involving
the right acromioclavicular joint and right glenohumeral
articulation. Of incidental note is the presence of a small right
pleural effusion. Soft tissues are unremarkable.
IMPRESSION: 1. Chronic fracture deformity of the proximal right humerus without
evidence of acute osseous abnormality.

## 2022-06-15 IMAGING — CR DG HIP (WITH OR WITHOUT PELVIS) 2-3V*R*
3 series · 3 of 3 positions shown · non-contrast
Comparison: None.

CLINICAL DATA: Status post fall.

EXAM:
DG HIP (WITH OR WITHOUT PELVIS) 2-3V RIGHT

[pelvis ap]
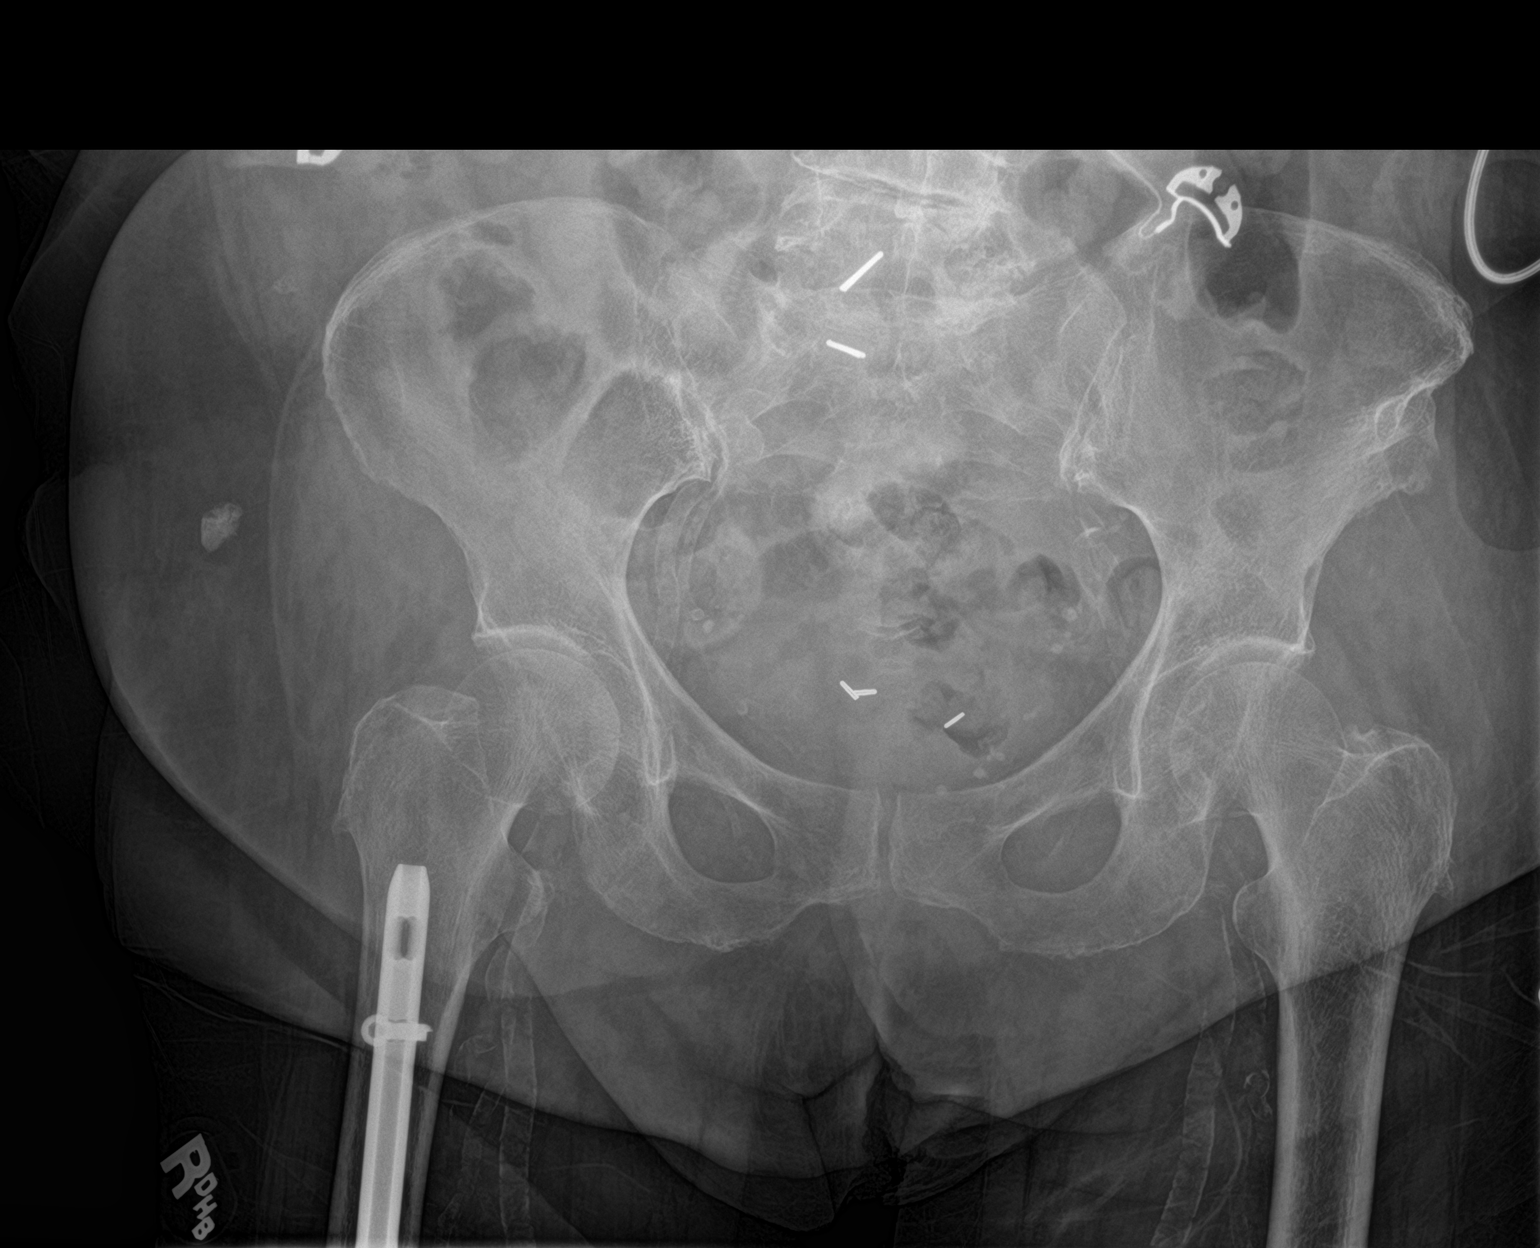

[hip ap]
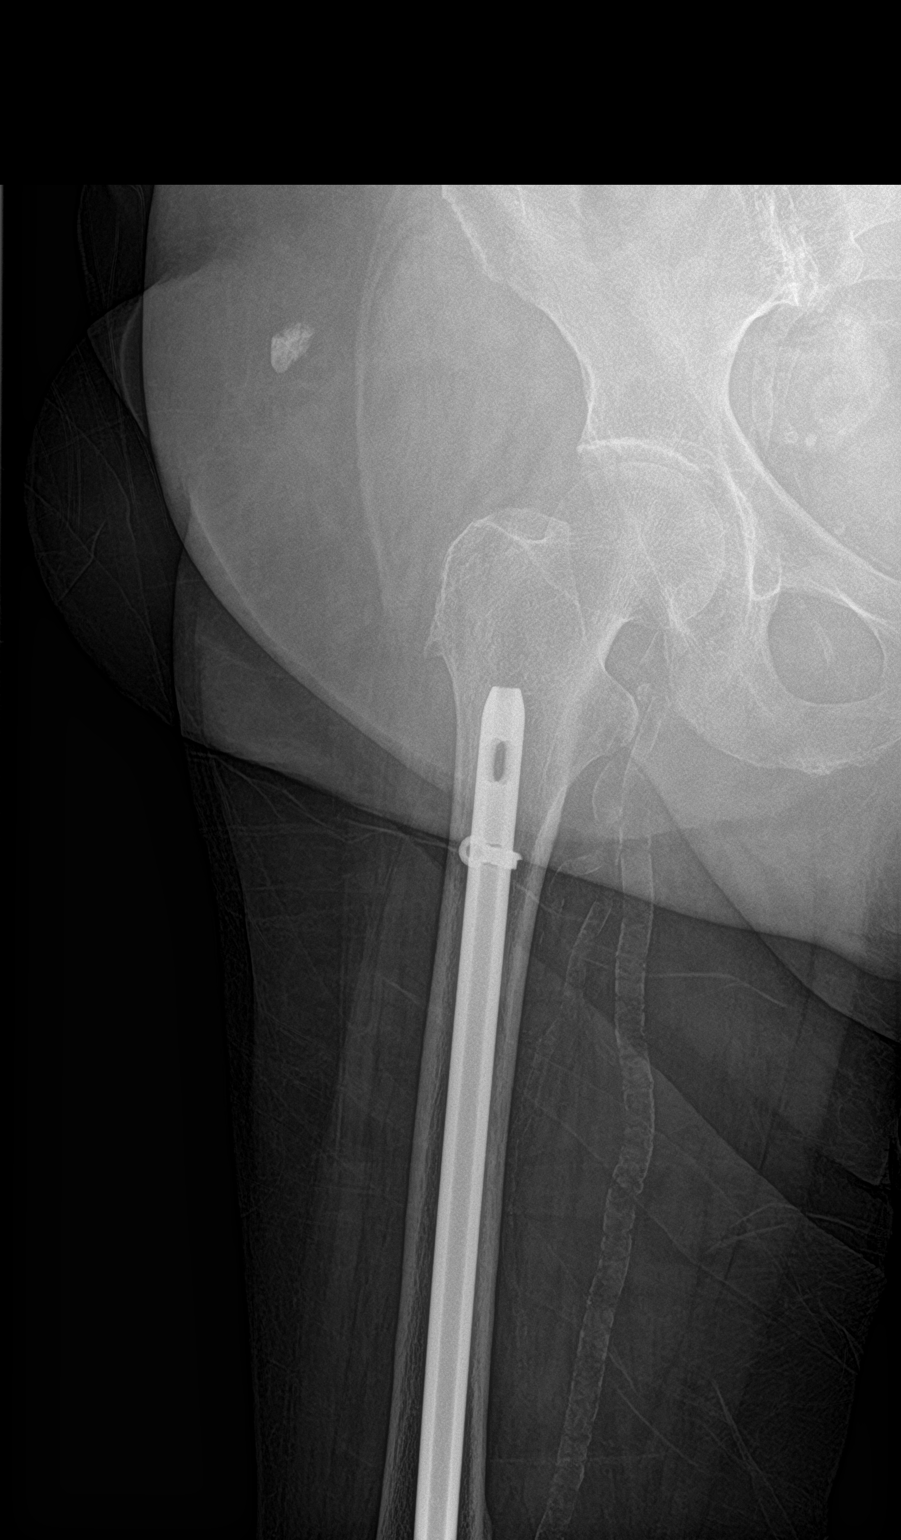

[hip lat]
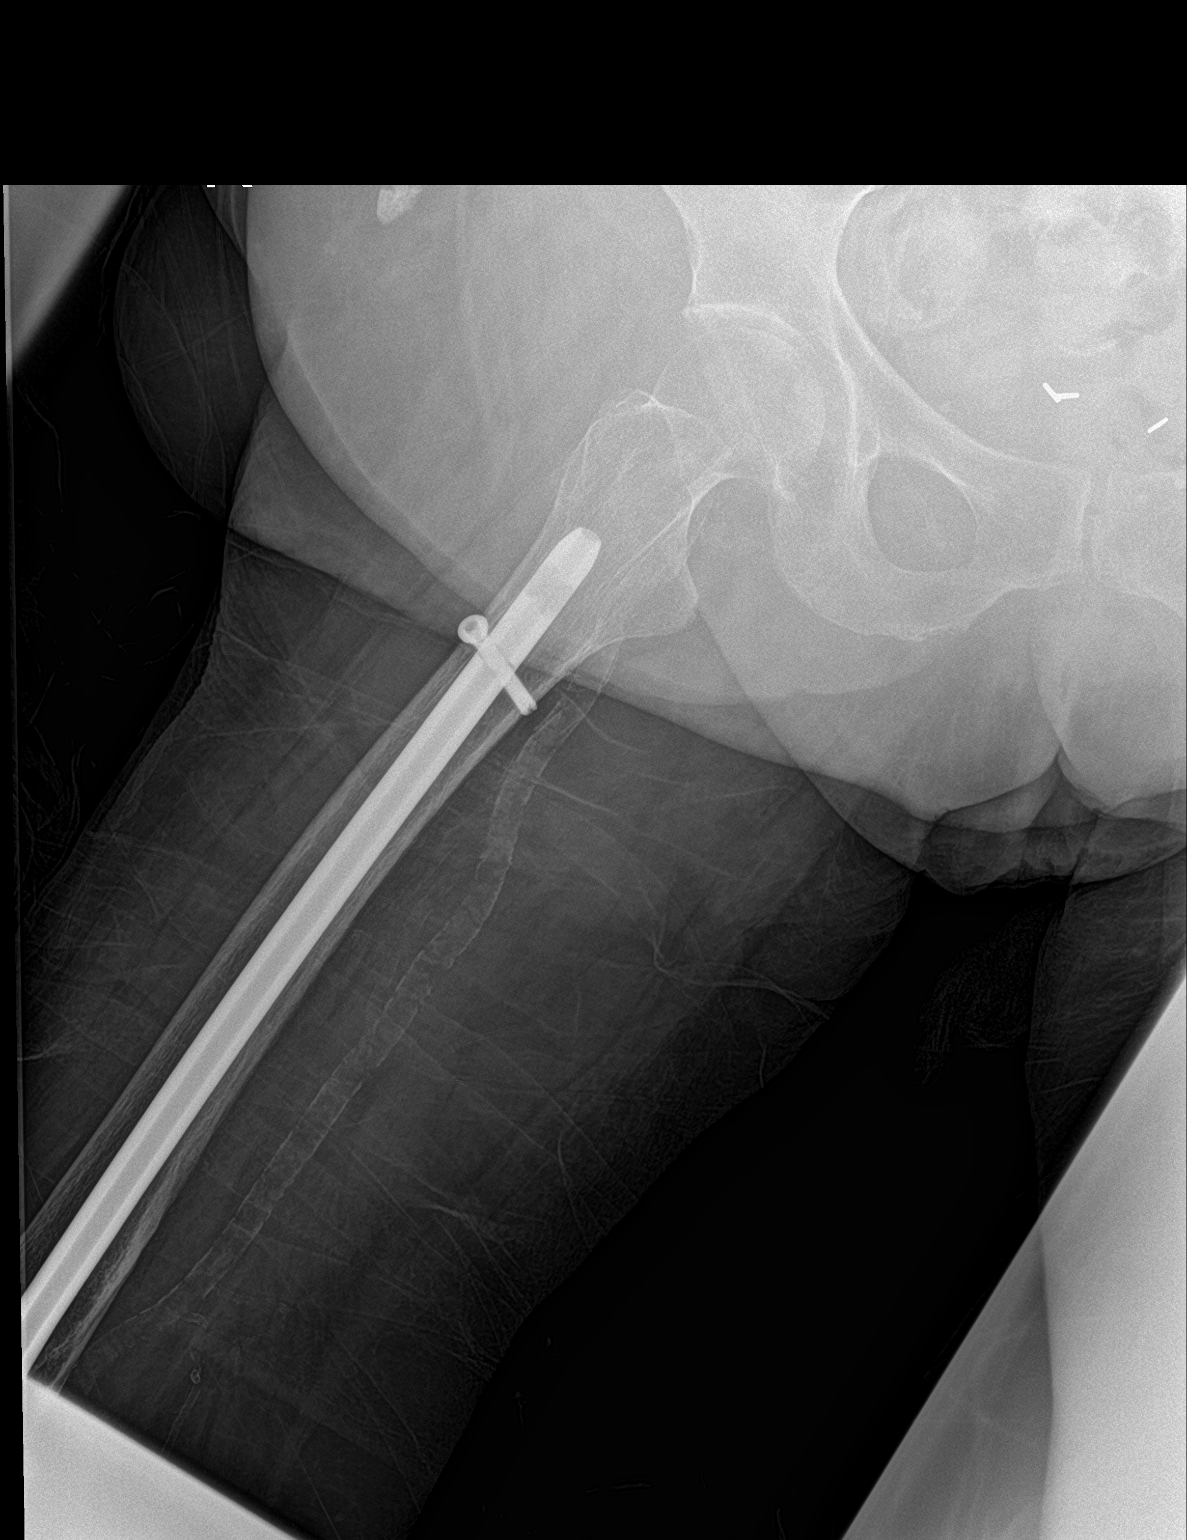

[3 of 3 positions shown; findings below may reference images not displayed]

FINDINGS: There is no evidence of acute hip fracture or dislocation. A
radiopaque intramedullary rod is seen within the proximal right
femur. Radiopaque surgical clips are seen overlying the lower
pelvis. There is no evidence of arthropathy or other focal bone
abnormality. Marked severity vascular calcification is seen.
IMPRESSION: No acute osseous abnormality.

## 2022-06-15 IMAGING — CT CT CHEST-ABD-PELV W/ CM
2 of 5 series · 9 of 36 positions shown, 10 images · IV contrast (agent unspecified)
Comparison: Same day radiographs, CT head 10/20/2018, MR thoracic
and lumbar spine 01/29/2020, CT abdomen pelvis 05/10/2017 CT angio
chest 02/24/2017

CLINICAL DATA: Fall from bed

EXAM:
CT HEAD WITHOUT CONTRAST
CT CERVICAL SPINE WITHOUT CONTRAST
CT CHEST, ABDOMEN AND PELVIS WITH CONTRAST
TECHNIQUE: Contiguous axial images were obtained from the base of the skull
through the vertex without intravenous contrast.

[Series 2: cap with · axial · 0.76mm/px · z∈[-755,-265]mm · 6 of 124 slices shown, 7 images]
[im 13/124  mediastinal]
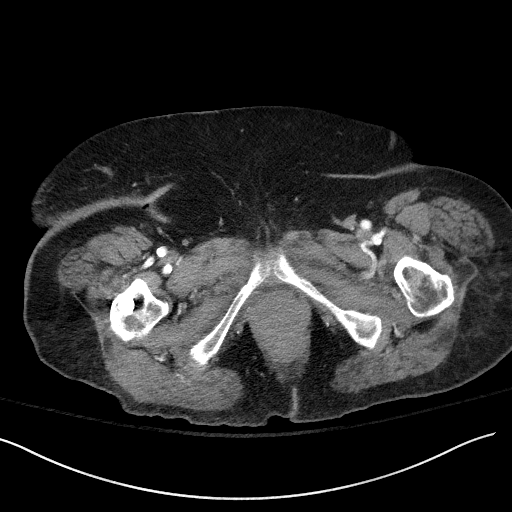
[im 13/124  bone]
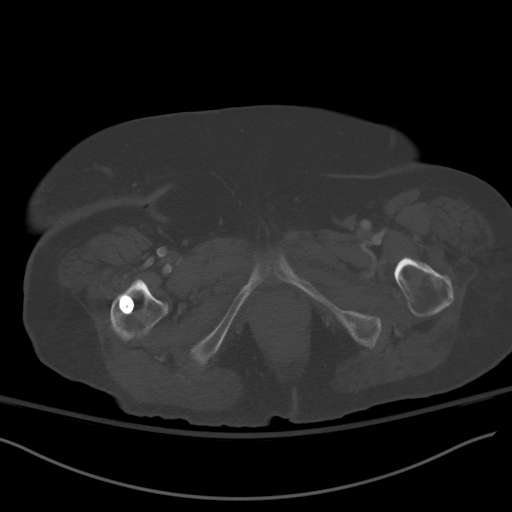
[im 37/124  mediastinal]
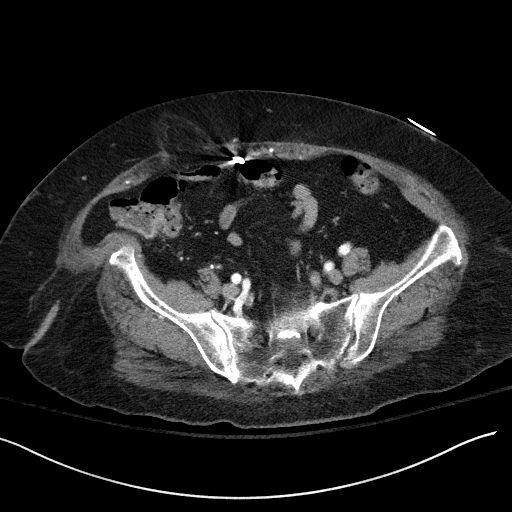
[im 50/124  mediastinal]
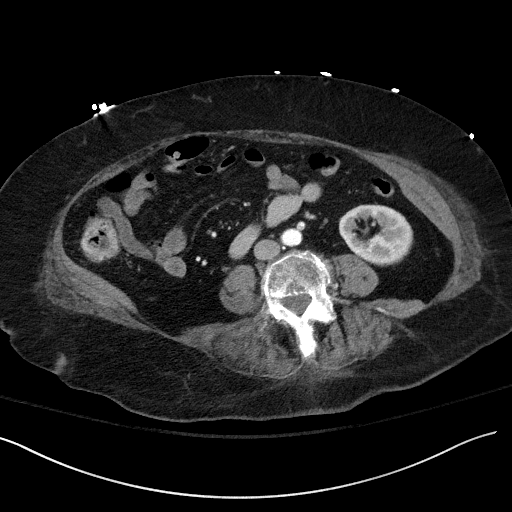
[im 74/124  mediastinal]
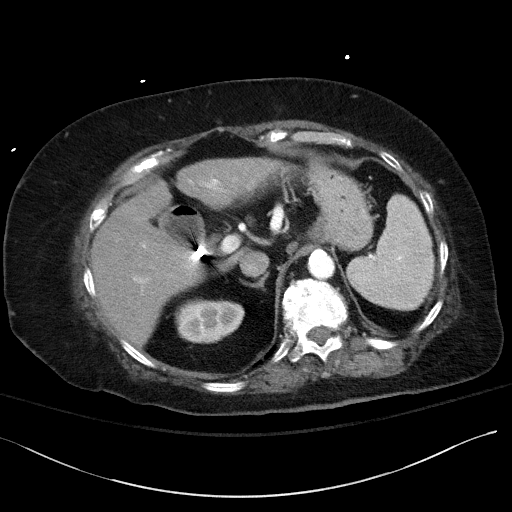
[im 87/124  mediastinal]
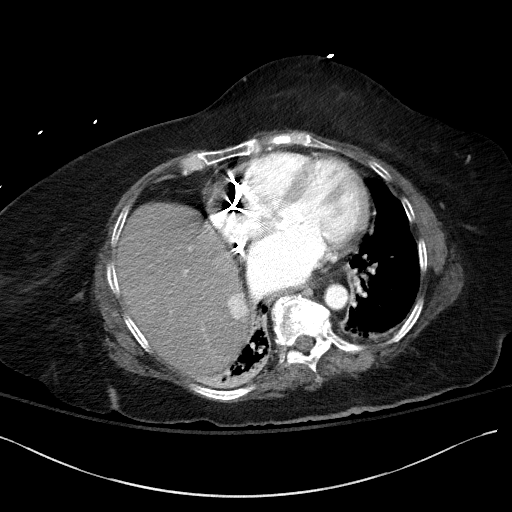
[im 111/124  mediastinal]
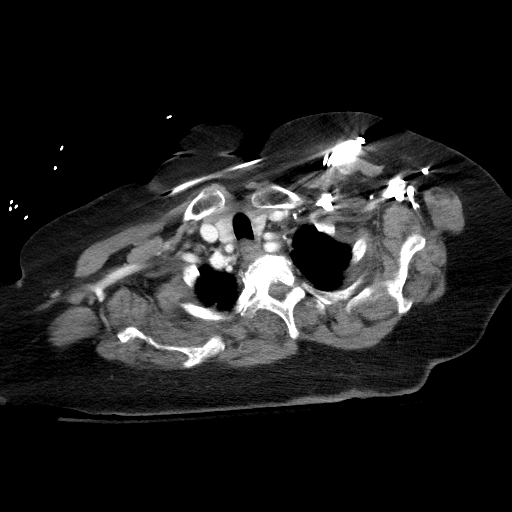

[Series 5: coronals · coronal · 0.76mm/px · 3 of 139 slices shown]
[im 28/139  mediastinal]
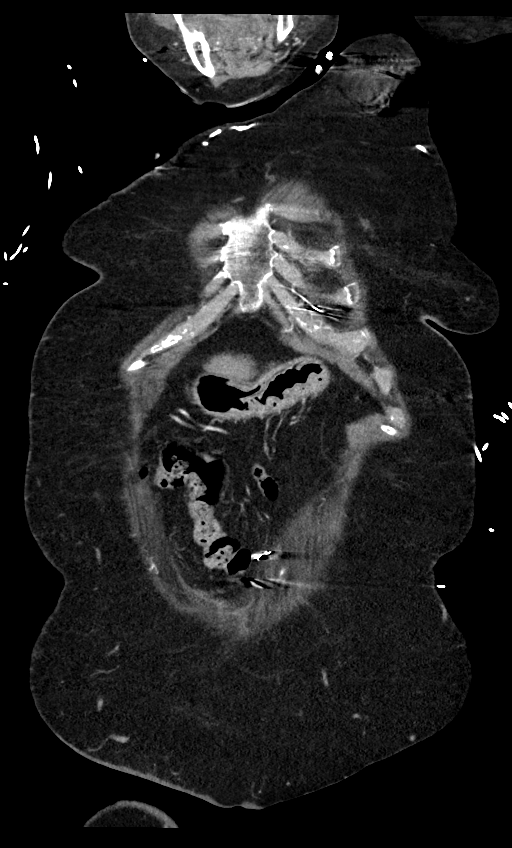
[im 56/139  mediastinal]
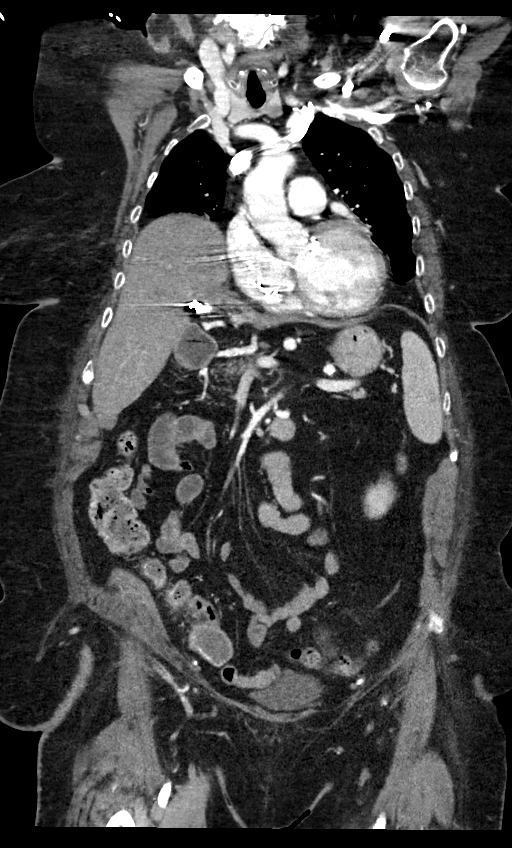
[im 83/139  mediastinal]
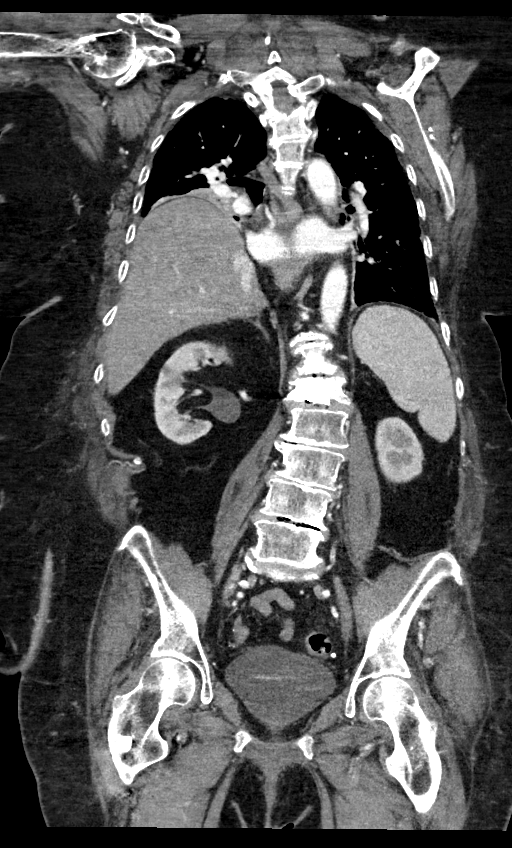

[9 of 36 positions shown; findings below may reference images not displayed]

Multidetector CT imaging of the cervical spine was performed without
intravenous contrast. Multiplanar CT image reconstructions were also
generated.

Multidetector CT imaging of the chest, abdomen and pelvis was
performed following the standard protocol during bolus
administration of intravenous contrast.

CONTRAST:  100mL OMNIPAQUE IOHEXOL 300 MG/ML  SOLN
FINDINGS: CT HEAD FINDINGS

Brain: Right cerebellar encephalomalacia subjacent to a suboccipital
craniectomy site compatible prior mass resection. No evidence of
acute infarction, hemorrhage, hydrocephalus, extra-axial collection,
new visible mass lesion or mass effect. Symmetric prominence of the
ventricles, cisterns and sulci compatible with parenchymal volume
loss. Patchy areas of white matter hypoattenuation are most
compatible with chronic microvascular angiopathy.

Vascular: Atherosclerotic calcification of the carotid siphons and
intradural vertebral arteries. No hyperdense vessel.

Skull: No calvarial fracture or suspicious osseous lesion. No scalp
swelling or hematoma. Postsurgical changes of prior right
suboccipital craniectomy without acute complication or significant
interval change.

Sinuses/Orbits: Anterior nasal septal perforation. Minimal
thickening in the ethmoids and maxillary sinuses. No layering
air-fluid levels or pneumatized secretions. Small bilateral mastoid
effusions. Middle ear cavities are clear.

Other: None.

CT CERVICAL FINDINGS

Alignment: Stabilization collar absent. Straightening of normal
cervical lordosis. Mild leftward lateral flexion. Leftward cranial
rotation as well. No evidence of traumatic listhesis. No abnormally
widened, perched or jumped facets. Normal alignment of the
craniocervical and atlantoaxial articulations. Degenerative
anterolisthesis C4 on C5 and C7 on T1. No evidence of traumatic
listhesis. No abnormally widened, perched or jumped facets. Normal
alignment of the craniocervical and atlantoaxial articulations.

Skull base and vertebrae: No acute skull base fracture. No vertebral
body fracture or height loss. Normal bone mineralization. Bony
ankylosis of the C5-6 vertebral bodies and left articular facets.
Multilevel spondylitic changes as below. Additional arthrosis at the
atlantodental and basion dens intervals.

Soft tissues and spinal canal: No pre or paravertebral fluid or
swelling. No visible canal hematoma.

Disc levels: Multilevel intervertebral disc height loss with
spondylitic endplate changes. Slightly more pronounced disc
osteophyte complexes and posterior osseous ridging C4-5 and C5-6
respectively result in some mild canal stenosis. Multilevel uncinate
spurring and facet hypertrophic changes result in mild-to-moderate
multilevel neural foraminal narrowing most pronounced on the left at
C3-4 and bilaterally at C7-T1.

Other:  None.

CT CHEST FINDINGS

Cardiovascular: The aortic root is suboptimally assessed given
cardiac pulsation artifact. Atherosclerotic plaque within the normal
caliber aorta. No acute luminal abnormality of the imaged aorta. No
periaortic stranding or hemorrhage. Normal 3 vessel branching of the
aortic arch. Shared origin of the brachiocephalic and left common
carotid arteries. Minimal plaque in the proximal great vessels.
Cardiac size is top normal. No pericardial effusion. Calcification
of mitral annulus and aortic leaflets. Three-vessel coronary artery
atherosclerosis. Left chest wall battery pack with leads at the
right atrium and apical septum. Central pulmonary arteries are
normal caliber. No large central or lobar filling defects within
limitations of this non tailored examination of the pulmonary
arteries. No major venous abnormalities.

Mediastinum/Nodes: No mediastinal fluid or gas. No acute abnormality
of the trachea or esophagus. No worrisome mediastinal, hilar or
axillary adenopathy.Few tiny subcentimeter hypoattenuating nodules
in the bilateral thyroid glan, not likely clinically significant; no
follow-up imaging recommended (ref: [HOSPITAL]. [DATE]): 143-50).

Lungs/Pleura: Hypoventilatory changes with more dense bandlike areas
of opacity likely reflecting subsegmental atelectasis or scarring in
the lung bases. Mild bronchitic features. No acute traumatic
abnormality of the lung parenchyma. No pneumothorax or effusion.

Musculoskeletal: Remote posttraumatic deformity of the right
humerus. Superimposed chronic degenerative changes including a
high-riding appearance of the humeral head suggesting underlying
rotator cuff insufficiency. More mild degenerative change of the
left shoulder. No visible displaced rib or sternal fracture is seen
no acute fracture or traumatic listhesis of the thoracic spine. No
large body wall hematoma or suspicious soft tissue abnormalities of
the chest. Mild body wall edema.

CT ABDOMEN PELVIS FINDINGS

Hepatobiliary: No direct hepatic injury or perihepatic hematoma. No
worrisome focal liver lesions. Smooth liver surface contour. Normal
hepatic attenuation. Prior cholecystectomy slight prominence of the
biliary tree likely related to reservoir effect.

Pancreas: No direct pancreatic injury or peripancreatic hematoma. No
ductal disruption. Partial fatty replacement of the pancreas. No
pancreatic ductal dilatation or surrounding inflammatory changes.

Spleen: No direct splenic injury or perisplenic hematoma. Normal in
size. No concerning splenic lesions.

Adrenals/Urinary Tract: No adrenal hemorrhage or suspicious adrenal
lesions. No direct renal injury or perinephric hemorrhage. Kidneys
enhance and excrete normally and symmetrically without extravasation
of contrast on the excretory delayed phase imaging. Right extrarenal
pelvis is unchanged from comparison. No concerning renal mass. No
urolithiasis or hydronephrosis urinary bladder is free of traumatic
findings or other acute abnormality.

Stomach/Bowel: Distal esophagus, stomach and duodenal sweep are
unremarkable. No small bowel wall thickening or dilatation. No
evidence of obstruction. Appendix is not visualized. No focal
inflammation the vicinity of the cecum to suggest an occult
appendicitis. No colonic dilatation or wall thickening. No evidence
of mesenteric hematoma or contusion.

Vascular/Lymphatic: Atherosclerotic calcifications within the
abdominal aorta and branch vessels. No aneurysm or ectasia. No
concerning acute vascular injury or sites of active contrast
extravasation. No enlarged abdominopelvic lymph nodes.

Reproductive: Uterus is surgically absent. No concerning adnexal
lesions. 4.2 cm fluid attenuation cystic lesion in the left adnexa
(2/95), new from prior. Incompletely characterized on this exam. No
concerning right adnexal lesion.

Other: No abdominopelvic free air or fluid. No bowel containing
hernia. Minimal increase in size of a broad-based fat containing
umbilical hernia. No evidence of fat strangulation. Postsurgical
changes noted in the anterior abdomen with few metallic surgical
clips. Mild circumferential body wall edema most pronounced
posteriorly. No traumatic abdominal wall dehiscence.

Musculoskeletal: Redemonstration of a chronic L1 vertebral body
fracture with approximately 30% height loss and post vertebroplasty
changes. Levocurvature of the thoracic and lumbar spine centered at
this L1 level. Grade 1 anterolisthesis L4 on 5, L5 on S1 without
associated spondylolysis. Multilevel degenerative changes are
present in the imaged portions of the spine. Bones of the pelvis and
bony sacrum are intact and congruent. Proximal femora intact and
normally located. Postsurgical changes prior right femoral
intramedullary nail placement.
IMPRESSION: CT head

1. No acute intracranial abnormality. No significant calvarial
fracture or scalp swelling
2. Prior right suboccipital craniectomy with subjacent
encephalomalacia.
3. Trace bilateral mastoid effusions.
4. Nasal septal perforation.
5. Chronic appearing nasal bone deformities. Correlate for point
tenderness.

CT cervical spine

1. No acute fracture or traumatic listhesis of the cervical spine.
2. Multilevel cervical spondylitic changes as detailed above.
3. Bony ankylosis of the C5-6 levels.

CT chest, abdomen and pelvis

1. No acute traumatic injury to the chest, abdomen, or pelvis.
2. Remote posttraumatic deformity of the right humerus.
3. Prior compression deformity of the L1 vertebral body with
approximately 30% height loss and post vertebroplasty changes.
4. Cardiomegaly and coronary artery atherosclerosis.
5. Mild body wall edema.  Correlate with fluid status.
6. Fat containing umbilical hernia, minimally increased in size from
prior without evidence of strangulation.
7. 4.2 cm fluid attenuation cystic lesion in the left adnexa, new
from prior. Because this lesion is not adequately characterized,
prompt outpatient US is recommended for further evaluation. Note:
This recommendation does not apply to premenarchal patients and to
those with increased risk (genetic, family history, elevated tumor
markers or other high-risk factors) of ovarian cancer. Reference:
JACR [DATE]):248-254
8. Aortic Atherosclerosis ([id number]-AK0.0).

## 2022-06-15 IMAGING — CT CT HEAD W/O CM
2 of 3 series · 10 of 47 positions shown, 12 images · IV contrast (agent unspecified)
Comparison: Same day radiographs, CT head 10/20/2018, MR thoracic
and lumbar spine 01/29/2020, CT abdomen pelvis 05/10/2017 CT angio
chest 02/24/2017

CLINICAL DATA: Fall from bed

EXAM:
CT HEAD WITHOUT CONTRAST
CT CERVICAL SPINE WITHOUT CONTRAST
CT CHEST, ABDOMEN AND PELVIS WITH CONTRAST
TECHNIQUE: Contiguous axial images were obtained from the base of the skull
through the vertex without intravenous contrast.

[Series 3: head wo · axial · 0.43mm/px · z∈[-126,-1]mm · 7 of 32 slices shown, 9 images]
[im 4/32  brain]
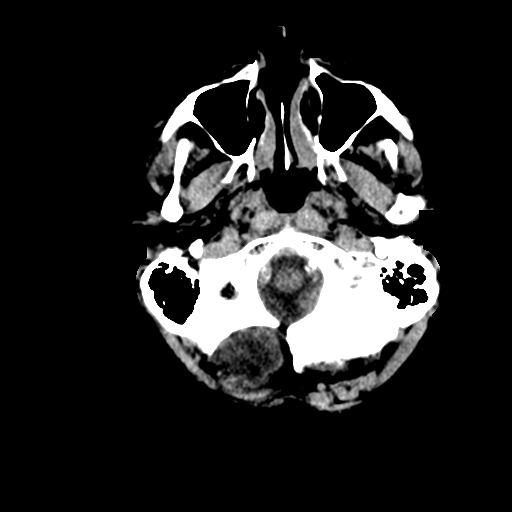
[im 4/32  bone]
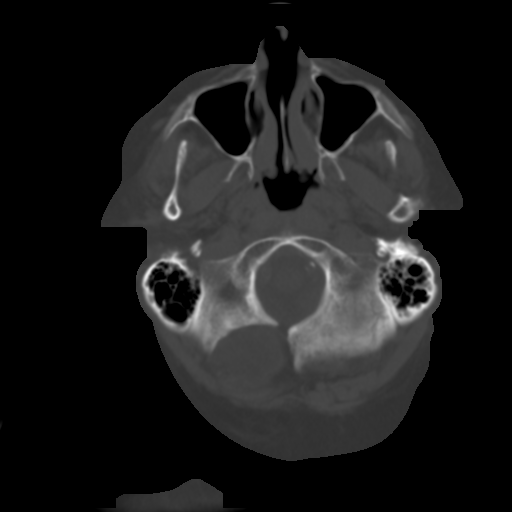
[im 8/32  brain]
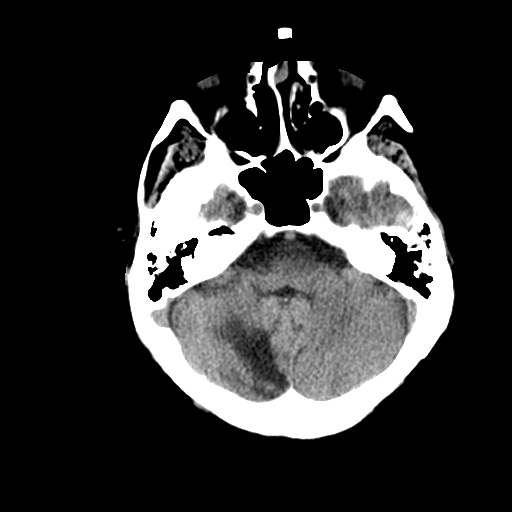
[im 12/32  brain]
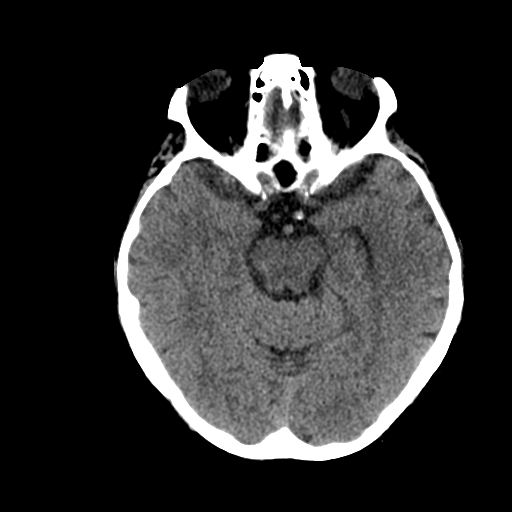
[im 17/32  brain]
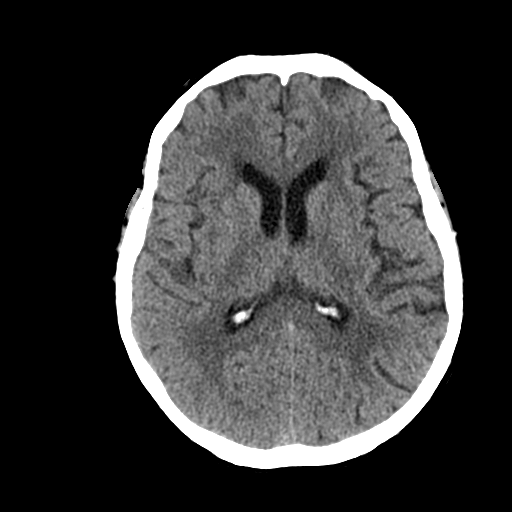
[im 21/32  brain]
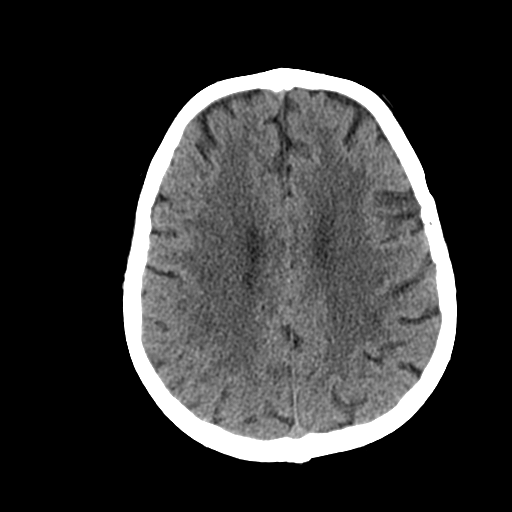
[im 21/32  bone]
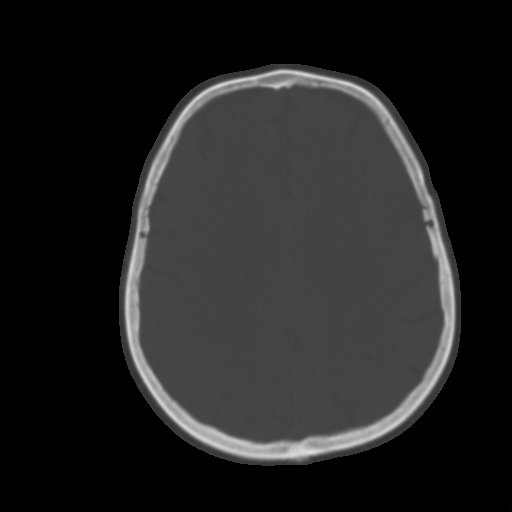
[im 25/32  brain]
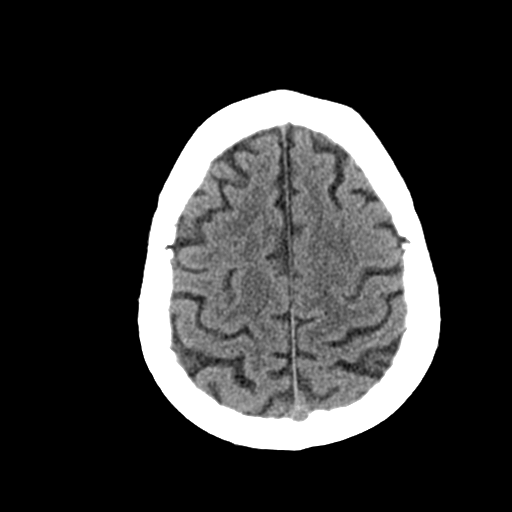
[im 29/32  brain]
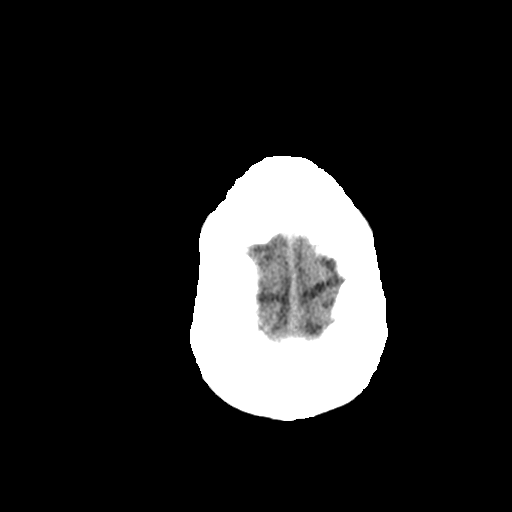

[Series 4: coronal soft tissue · coronal · 0.30mm/px · 3 of 67 slices shown]
[im 23/67  brain]
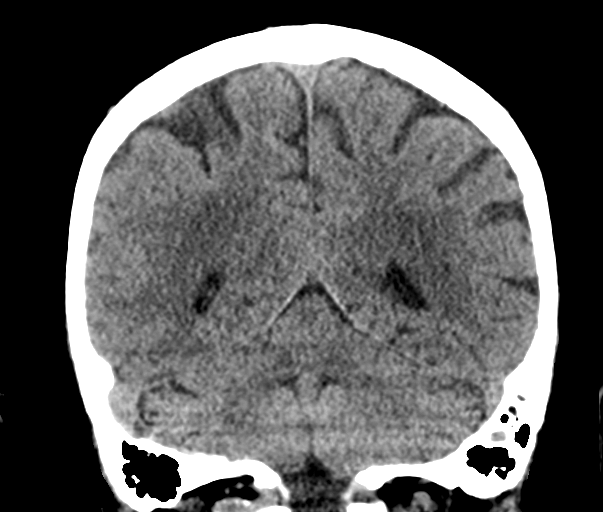
[im 30/67  brain]
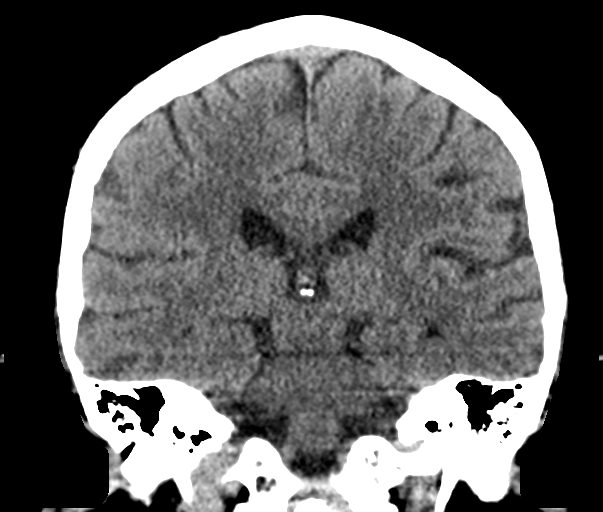
[im 37/67  brain]
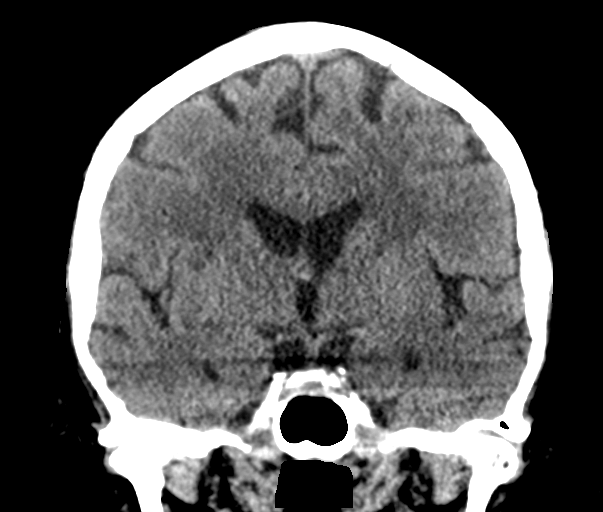

[10 of 47 positions shown; findings below may reference images not displayed]

Multidetector CT imaging of the cervical spine was performed without
intravenous contrast. Multiplanar CT image reconstructions were also
generated.

Multidetector CT imaging of the chest, abdomen and pelvis was
performed following the standard protocol during bolus
administration of intravenous contrast.

CONTRAST:  100mL OMNIPAQUE IOHEXOL 300 MG/ML  SOLN
FINDINGS: CT HEAD FINDINGS

Brain: Right cerebellar encephalomalacia subjacent to a suboccipital
craniectomy site compatible prior mass resection. No evidence of
acute infarction, hemorrhage, hydrocephalus, extra-axial collection,
new visible mass lesion or mass effect. Symmetric prominence of the
ventricles, cisterns and sulci compatible with parenchymal volume
loss. Patchy areas of white matter hypoattenuation are most
compatible with chronic microvascular angiopathy.

Vascular: Atherosclerotic calcification of the carotid siphons and
intradural vertebral arteries. No hyperdense vessel.

Skull: No calvarial fracture or suspicious osseous lesion. No scalp
swelling or hematoma. Postsurgical changes of prior right
suboccipital craniectomy without acute complication or significant
interval change.

Sinuses/Orbits: Anterior nasal septal perforation. Minimal
thickening in the ethmoids and maxillary sinuses. No layering
air-fluid levels or pneumatized secretions. Small bilateral mastoid
effusions. Middle ear cavities are clear.

Other: None.

CT CERVICAL FINDINGS

Alignment: Stabilization collar absent. Straightening of normal
cervical lordosis. Mild leftward lateral flexion. Leftward cranial
rotation as well. No evidence of traumatic listhesis. No abnormally
widened, perched or jumped facets. Normal alignment of the
craniocervical and atlantoaxial articulations. Degenerative
anterolisthesis C4 on C5 and C7 on T1. No evidence of traumatic
listhesis. No abnormally widened, perched or jumped facets. Normal
alignment of the craniocervical and atlantoaxial articulations.

Skull base and vertebrae: No acute skull base fracture. No vertebral
body fracture or height loss. Normal bone mineralization. Bony
ankylosis of the C5-6 vertebral bodies and left articular facets.
Multilevel spondylitic changes as below. Additional arthrosis at the
atlantodental and basion dens intervals.

Soft tissues and spinal canal: No pre or paravertebral fluid or
swelling. No visible canal hematoma.

Disc levels: Multilevel intervertebral disc height loss with
spondylitic endplate changes. Slightly more pronounced disc
osteophyte complexes and posterior osseous ridging C4-5 and C5-6
respectively result in some mild canal stenosis. Multilevel uncinate
spurring and facet hypertrophic changes result in mild-to-moderate
multilevel neural foraminal narrowing most pronounced on the left at
C3-4 and bilaterally at C7-T1.

Other:  None.

CT CHEST FINDINGS

Cardiovascular: The aortic root is suboptimally assessed given
cardiac pulsation artifact. Atherosclerotic plaque within the normal
caliber aorta. No acute luminal abnormality of the imaged aorta. No
periaortic stranding or hemorrhage. Normal 3 vessel branching of the
aortic arch. Shared origin of the brachiocephalic and left common
carotid arteries. Minimal plaque in the proximal great vessels.
Cardiac size is top normal. No pericardial effusion. Calcification
of mitral annulus and aortic leaflets. Three-vessel coronary artery
atherosclerosis. Left chest wall battery pack with leads at the
right atrium and apical septum. Central pulmonary arteries are
normal caliber. No large central or lobar filling defects within
limitations of this non tailored examination of the pulmonary
arteries. No major venous abnormalities.

Mediastinum/Nodes: No mediastinal fluid or gas. No acute abnormality
of the trachea or esophagus. No worrisome mediastinal, hilar or
axillary adenopathy.Few tiny subcentimeter hypoattenuating nodules
in the bilateral thyroid glan, not likely clinically significant; no
follow-up imaging recommended (ref: [HOSPITAL]. [DATE]): 143-50).

Lungs/Pleura: Hypoventilatory changes with more dense bandlike areas
of opacity likely reflecting subsegmental atelectasis or scarring in
the lung bases. Mild bronchitic features. No acute traumatic
abnormality of the lung parenchyma. No pneumothorax or effusion.

Musculoskeletal: Remote posttraumatic deformity of the right
humerus. Superimposed chronic degenerative changes including a
high-riding appearance of the humeral head suggesting underlying
rotator cuff insufficiency. More mild degenerative change of the
left shoulder. No visible displaced rib or sternal fracture is seen
no acute fracture or traumatic listhesis of the thoracic spine. No
large body wall hematoma or suspicious soft tissue abnormalities of
the chest. Mild body wall edema.

CT ABDOMEN PELVIS FINDINGS

Hepatobiliary: No direct hepatic injury or perihepatic hematoma. No
worrisome focal liver lesions. Smooth liver surface contour. Normal
hepatic attenuation. Prior cholecystectomy slight prominence of the
biliary tree likely related to reservoir effect.

Pancreas: No direct pancreatic injury or peripancreatic hematoma. No
ductal disruption. Partial fatty replacement of the pancreas. No
pancreatic ductal dilatation or surrounding inflammatory changes.

Spleen: No direct splenic injury or perisplenic hematoma. Normal in
size. No concerning splenic lesions.

Adrenals/Urinary Tract: No adrenal hemorrhage or suspicious adrenal
lesions. No direct renal injury or perinephric hemorrhage. Kidneys
enhance and excrete normally and symmetrically without extravasation
of contrast on the excretory delayed phase imaging. Right extrarenal
pelvis is unchanged from comparison. No concerning renal mass. No
urolithiasis or hydronephrosis urinary bladder is free of traumatic
findings or other acute abnormality.

Stomach/Bowel: Distal esophagus, stomach and duodenal sweep are
unremarkable. No small bowel wall thickening or dilatation. No
evidence of obstruction. Appendix is not visualized. No focal
inflammation the vicinity of the cecum to suggest an occult
appendicitis. No colonic dilatation or wall thickening. No evidence
of mesenteric hematoma or contusion.

Vascular/Lymphatic: Atherosclerotic calcifications within the
abdominal aorta and branch vessels. No aneurysm or ectasia. No
concerning acute vascular injury or sites of active contrast
extravasation. No enlarged abdominopelvic lymph nodes.

Reproductive: Uterus is surgically absent. No concerning adnexal
lesions. 4.2 cm fluid attenuation cystic lesion in the left adnexa
(2/95), new from prior. Incompletely characterized on this exam. No
concerning right adnexal lesion.

Other: No abdominopelvic free air or fluid. No bowel containing
hernia. Minimal increase in size of a broad-based fat containing
umbilical hernia. No evidence of fat strangulation. Postsurgical
changes noted in the anterior abdomen with few metallic surgical
clips. Mild circumferential body wall edema most pronounced
posteriorly. No traumatic abdominal wall dehiscence.

Musculoskeletal: Redemonstration of a chronic L1 vertebral body
fracture with approximately 30% height loss and post vertebroplasty
changes. Levocurvature of the thoracic and lumbar spine centered at
this L1 level. Grade 1 anterolisthesis L4 on 5, L5 on S1 without
associated spondylolysis. Multilevel degenerative changes are
present in the imaged portions of the spine. Bones of the pelvis and
bony sacrum are intact and congruent. Proximal femora intact and
normally located. Postsurgical changes prior right femoral
intramedullary nail placement.
IMPRESSION: CT head

1. No acute intracranial abnormality. No significant calvarial
fracture or scalp swelling
2. Prior right suboccipital craniectomy with subjacent
encephalomalacia.
3. Trace bilateral mastoid effusions.
4. Nasal septal perforation.
5. Chronic appearing nasal bone deformities. Correlate for point
tenderness.

CT cervical spine

1. No acute fracture or traumatic listhesis of the cervical spine.
2. Multilevel cervical spondylitic changes as detailed above.
3. Bony ankylosis of the C5-6 levels.

CT chest, abdomen and pelvis

1. No acute traumatic injury to the chest, abdomen, or pelvis.
2. Remote posttraumatic deformity of the right humerus.
3. Prior compression deformity of the L1 vertebral body with
approximately 30% height loss and post vertebroplasty changes.
4. Cardiomegaly and coronary artery atherosclerosis.
5. Mild body wall edema.  Correlate with fluid status.
6. Fat containing umbilical hernia, minimally increased in size from
prior without evidence of strangulation.
7. 4.2 cm fluid attenuation cystic lesion in the left adnexa, new
from prior. Because this lesion is not adequately characterized,
prompt outpatient US is recommended for further evaluation. Note:
This recommendation does not apply to premenarchal patients and to
those with increased risk (genetic, family history, elevated tumor
markers or other high-risk factors) of ovarian cancer. Reference:
JACR [DATE]):248-254
8. Aortic Atherosclerosis ([id number]).

## 2022-06-22 ENCOUNTER — Inpatient Hospital Stay: Payer: Medicare Other | Attending: Internal Medicine | Admitting: Internal Medicine

## 2022-06-29 IMAGING — CR DG PELVIS 1-2V
1 series · 1 of 1 positions shown · non-contrast
Comparison: Pelvic CT 10/02/2020.

CLINICAL DATA: Low back and pelvic pain since falling yesterday.

EXAM:
PELVIS - 1-2 VIEW

[dg pelvis 1-2 views]
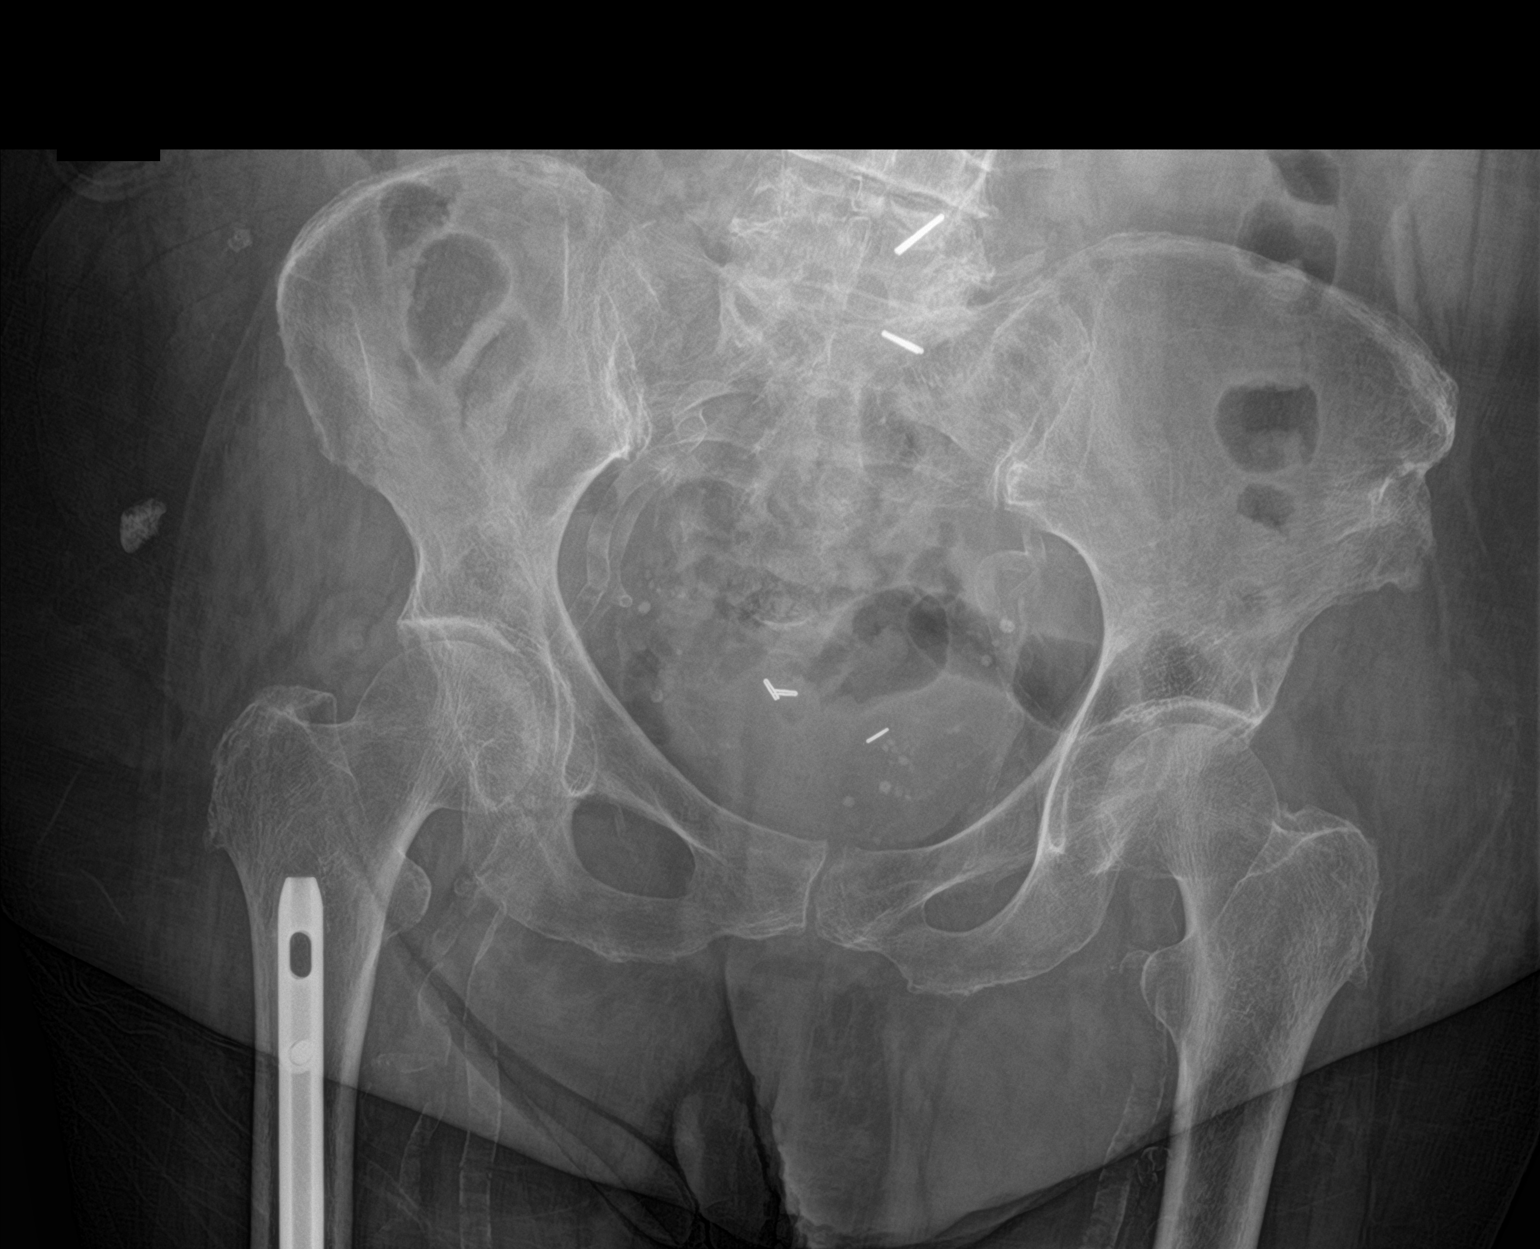

[1 of 1 positions shown; findings below may reference images not displayed]

FINDINGS: There is no evidence of acute fracture or dislocation. The
sacroiliac joints and symphysis pubis are intact. The hip joint
spaces are preserved. Previous right femoral intramedullary nail
placement, incompletely visualized. Surgical clips are noted in the
pelvis. There are diffuse vascular calcifications.
IMPRESSION: No acute osseous findings or significant arthropathic changes.

## 2022-06-29 IMAGING — CR DG LUMBAR SPINE COMPLETE 4+V
1 series · 5 of 5 positions shown · non-contrast
Comparison: Lumbar MRI 01/29/2020.  Radiographs 06/09/2017.

CLINICAL DATA: Low back pain since falling yesterday.

EXAM:
LUMBAR SPINE - COMPLETE 4+ VIEW

[Series 1: dg lumbar spine complete 4 +v · 0.14mm/px · 5 of 5 slices shown]
[im 1/5]
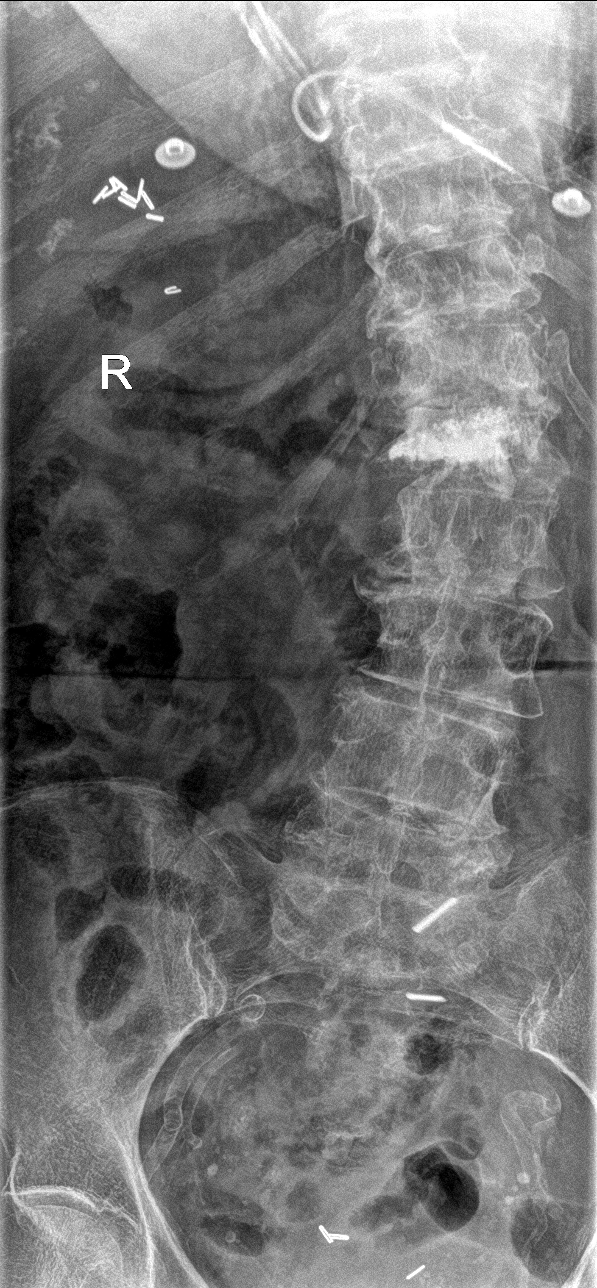
[im 2/5]
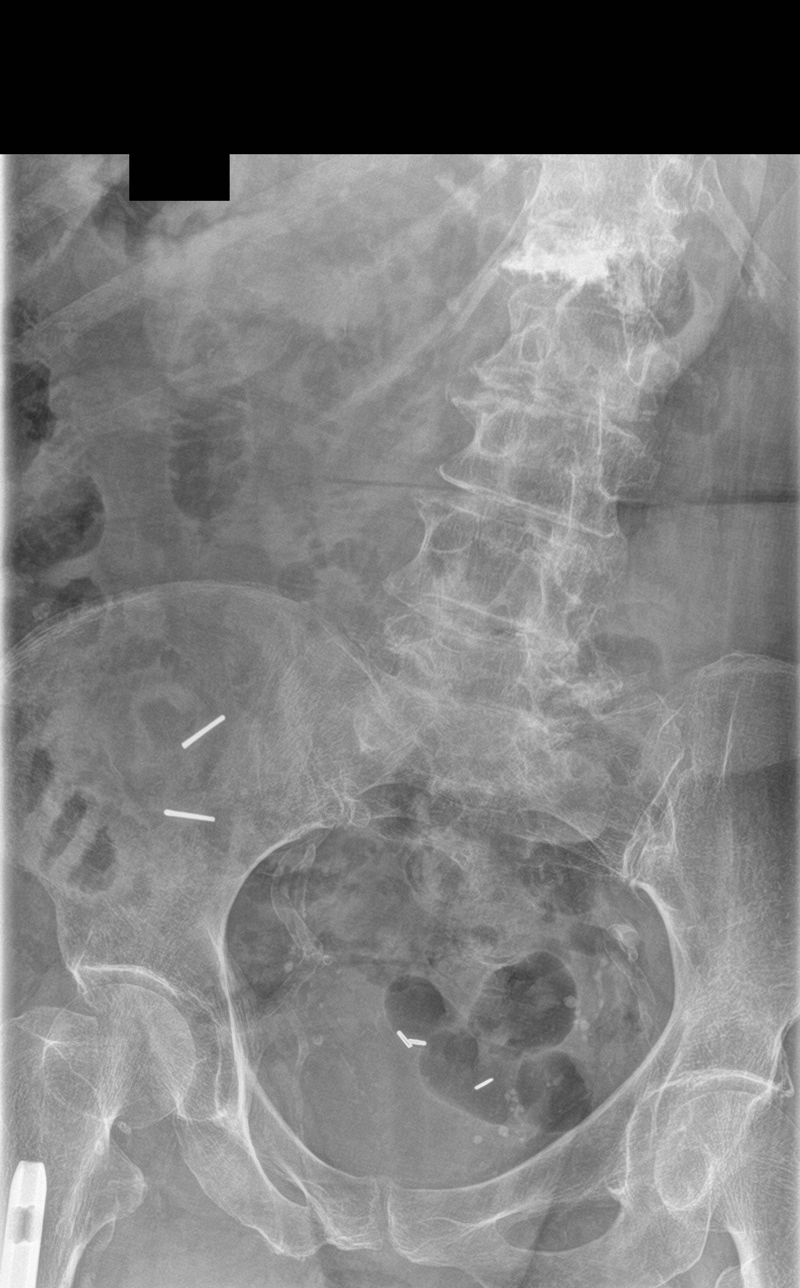
[im 3/5]
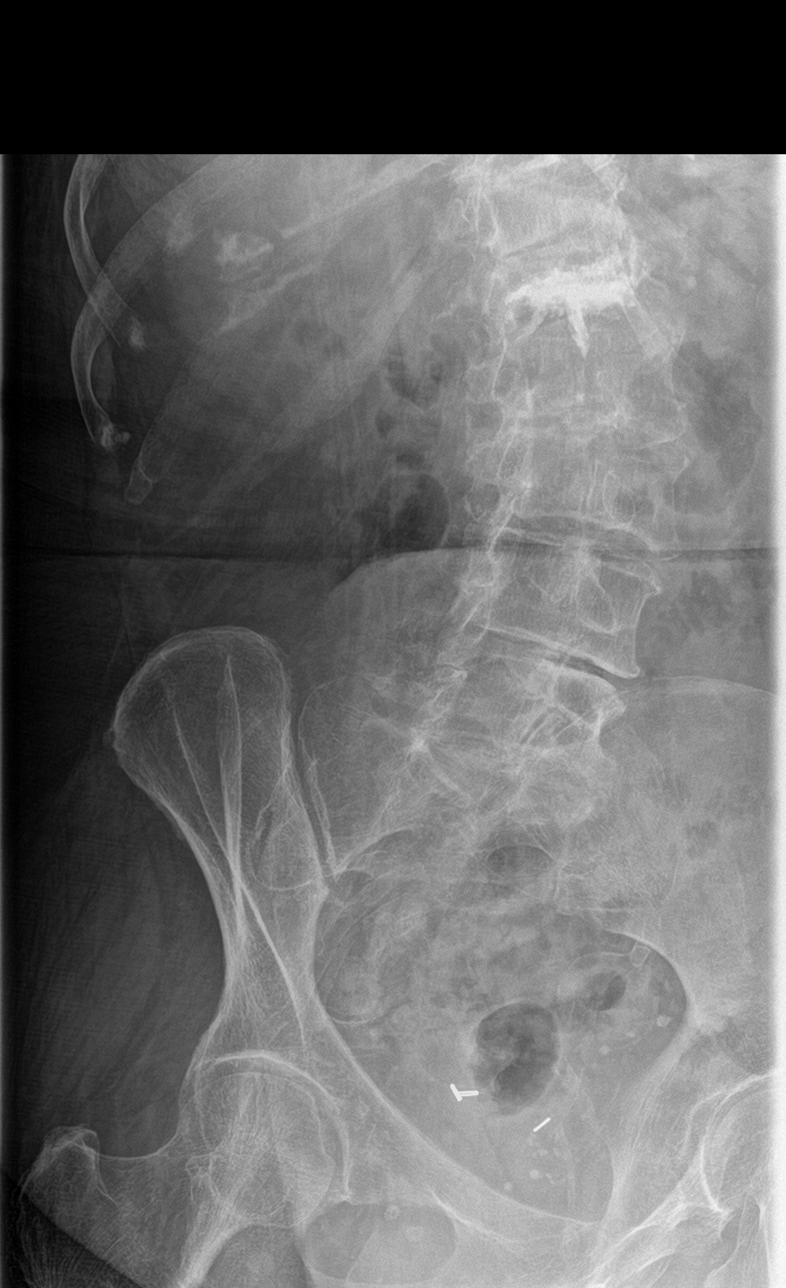
[im 4/5]
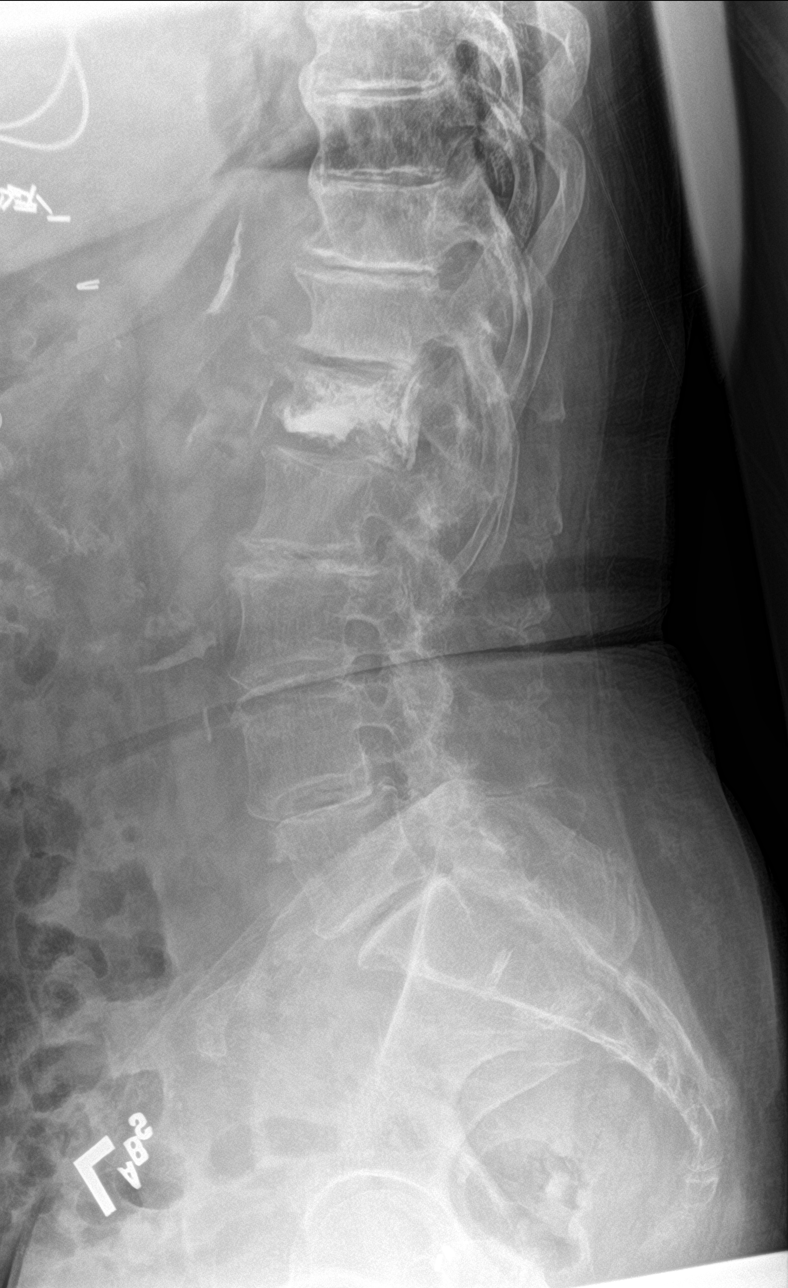
[im 5/5]
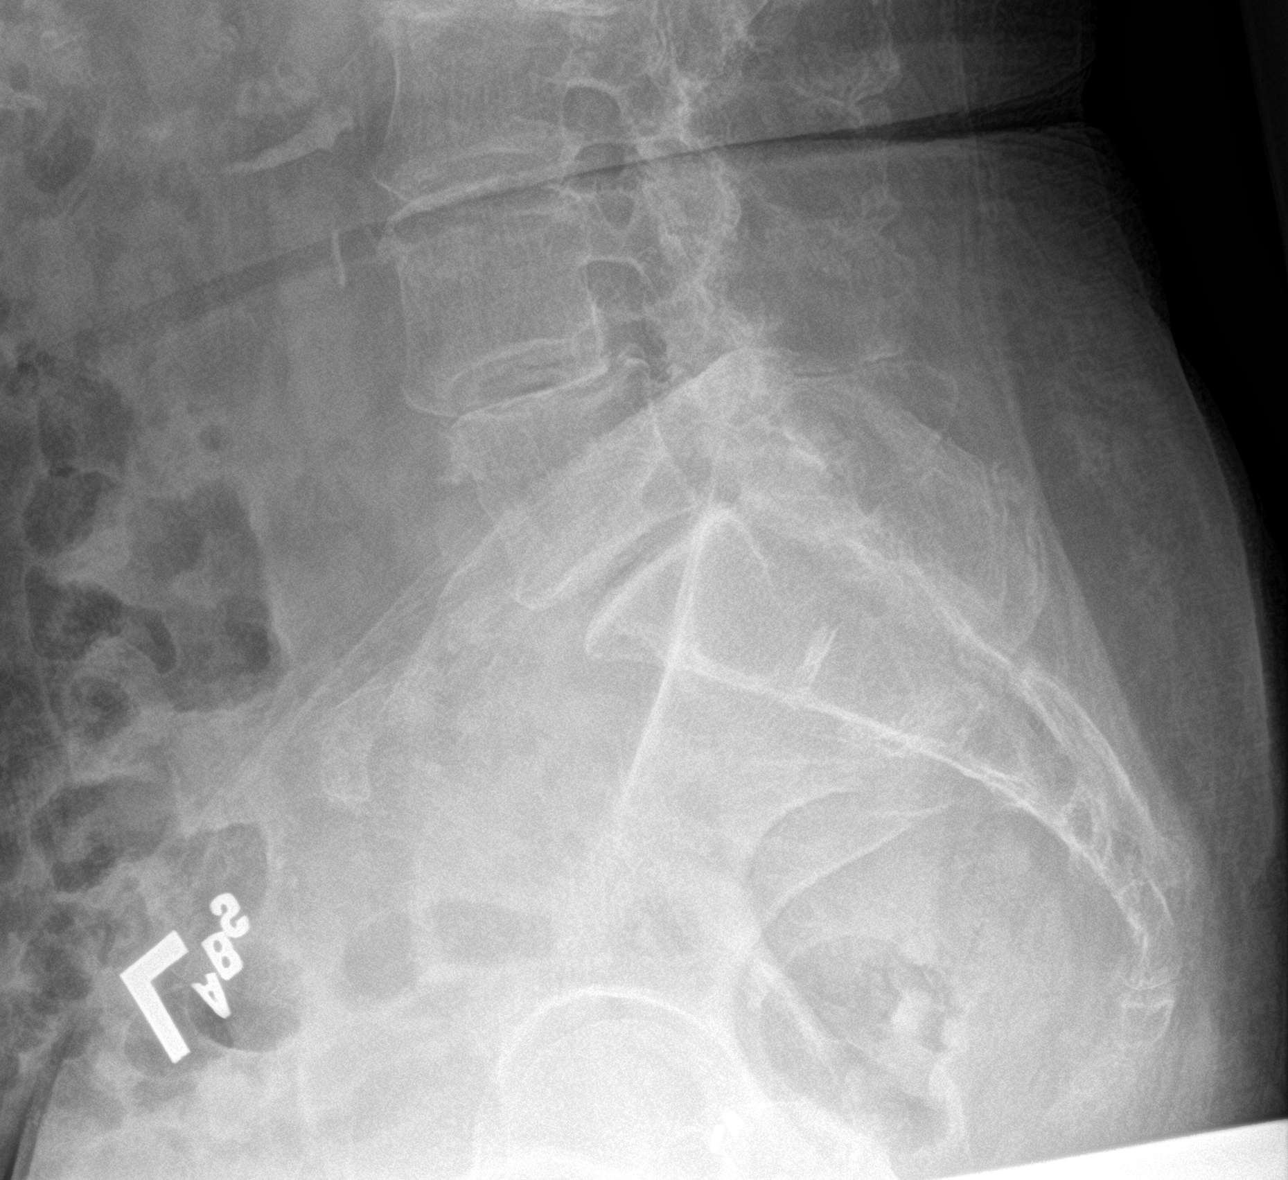

[5 of 5 positions shown; findings below may reference images not displayed]

FINDINGS: There are 5 lumbar type vertebral bodies. The bones are diffusely
demineralized. The alignment is stable with a convex left scoliosis,
grade 1 retrolisthesis at L1-2 and grade 1 anterolisthesis at L4-5.
The L1 vertebral body appears unchanged and is post spinal
augmentation. No evidence of acute fracture or traumatic
subluxation. Multilevel spondylosis appears unchanged.
IMPRESSION: Stable postoperative appearance of the lumbar spine with multilevel
spondylosis and scoliosis. No acute osseous findings identified.

## 2022-06-29 IMAGING — CR DG THORACIC SPINE 2V
1 series · 2 of 2 positions shown · non-contrast
Comparison: Radiographs 02/29/2016 and MRI 01/29/2020

CLINICAL DATA: Low back pain since falling yesterday.

EXAM:
THORACIC SPINE 2 VIEWS

[Series 1: dg thoracic spine 2 view · 0.14mm/px · 2 of 2 slices shown]
[im 1/2]
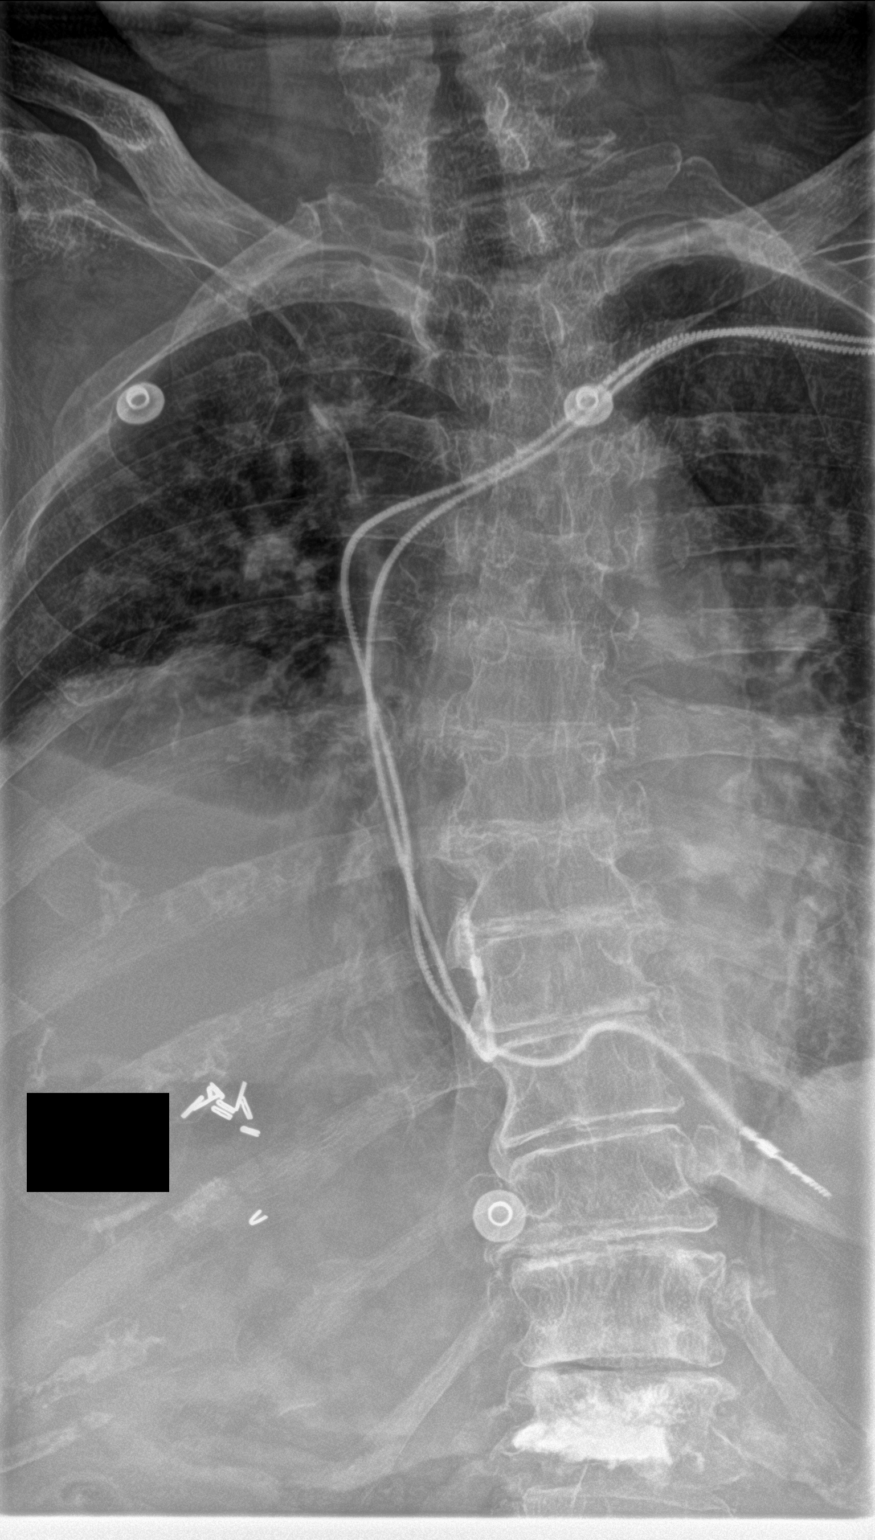
[im 2/2]
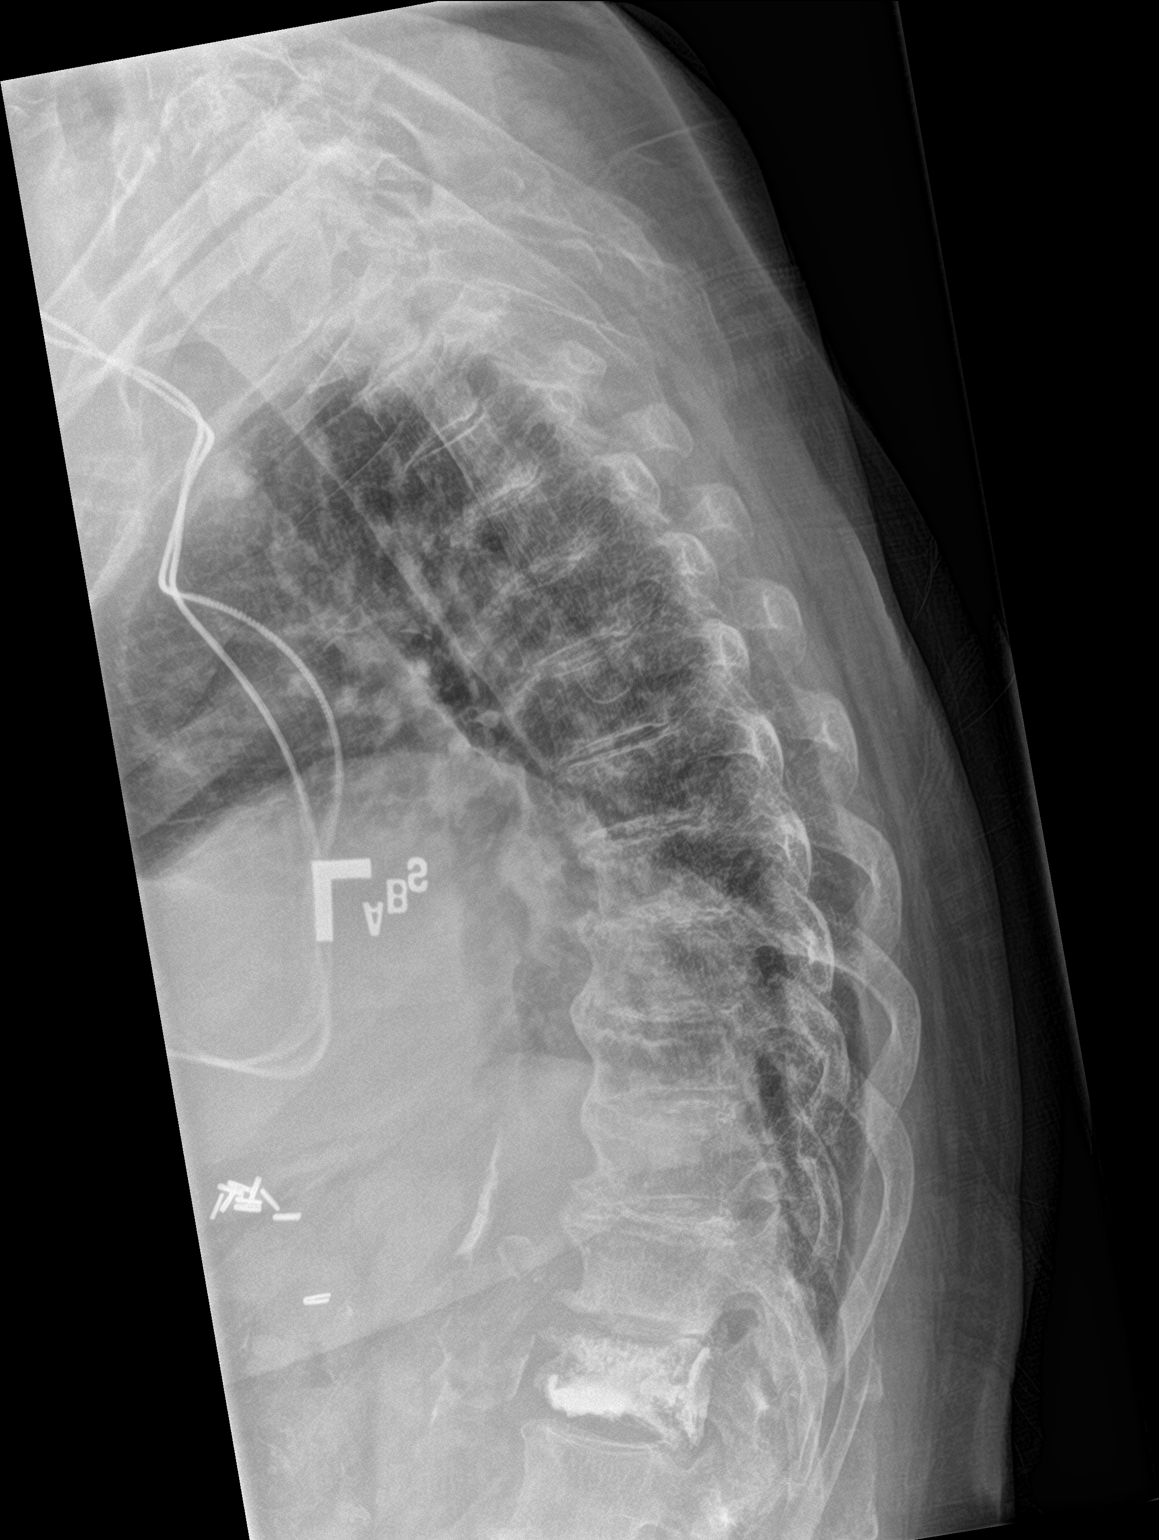

[2 of 2 positions shown; findings below may reference images not displayed]

FINDINGS: There are 12 rib-bearing thoracic type vertebral bodies. The
alignment is stable with a mild scoliosis. The bones are
demineralized. No evidence of acute fracture, paraspinal hematoma or
widening of the interpedicular distance. There is multilevel
spondylosis. Previous spinal augmentation noted at L1. Left
subclavian pacemaker leads are in place.
IMPRESSION: No evidence of acute thoracic spine injury. Stable alignment and
multilevel spondylosis.

## 2022-06-29 IMAGING — CT CT HEAD W/O CM
3 series · 14 of 47 positions shown, 16 images · non-contrast
Comparison: October 02, 2020

CLINICAL DATA: Head trauma after falling

EXAM:
CT HEAD WITHOUT CONTRAST
TECHNIQUE: Contiguous axial images were obtained from the base of the skull
through the vertex without intravenous contrast.

[Series 2: head wo · axial · 0.40mm/px · z∈[+260,+385]mm · 8 of 31 slices shown, 10 images]
[im 3/31  brain]
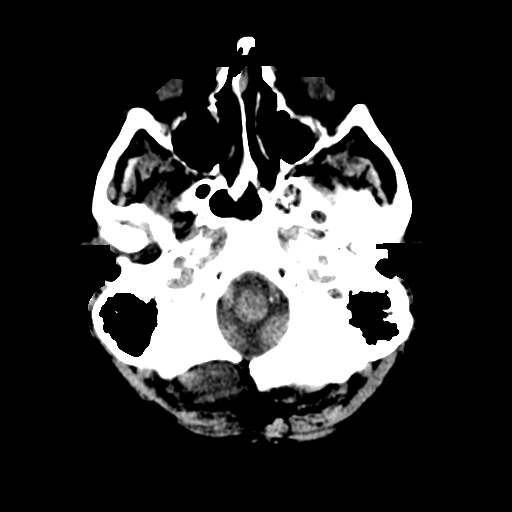
[im 3/31  bone]
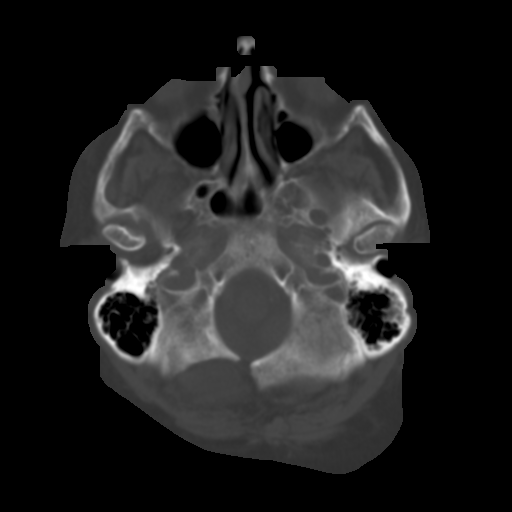
[im 7/31  brain]
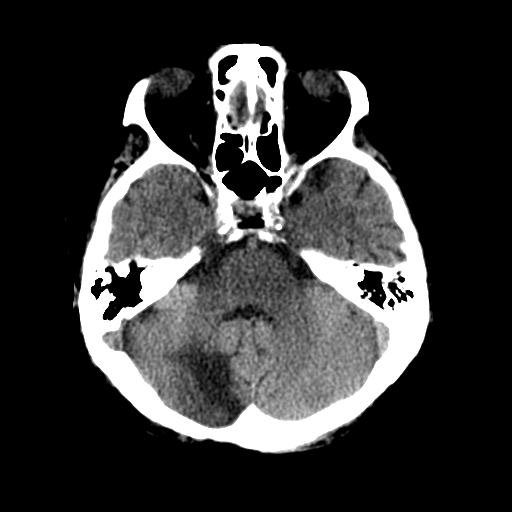
[im 10/31  brain]
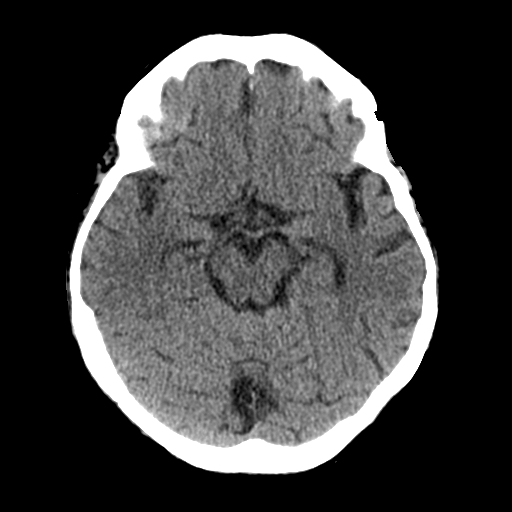
[im 14/31  brain]
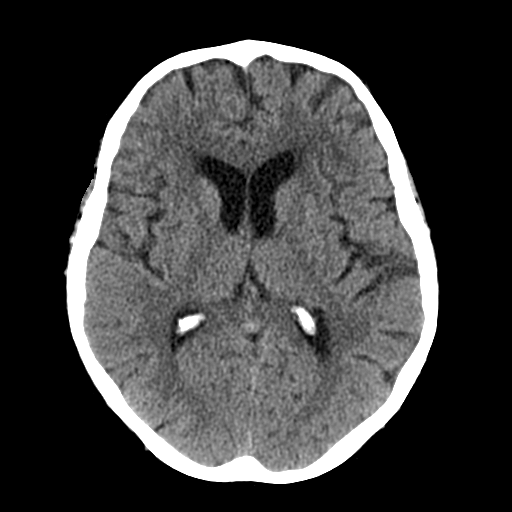
[im 17/31  brain]
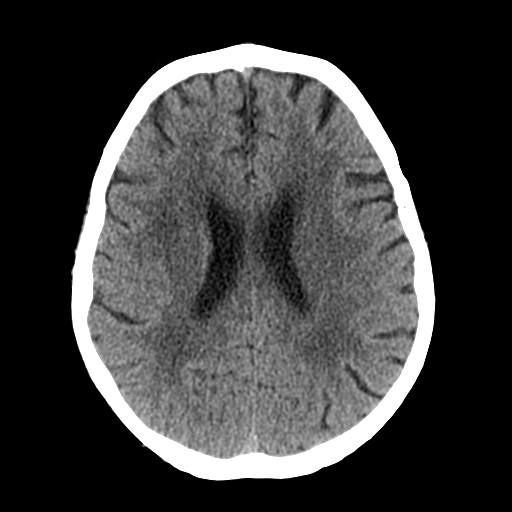
[im 17/31  bone]
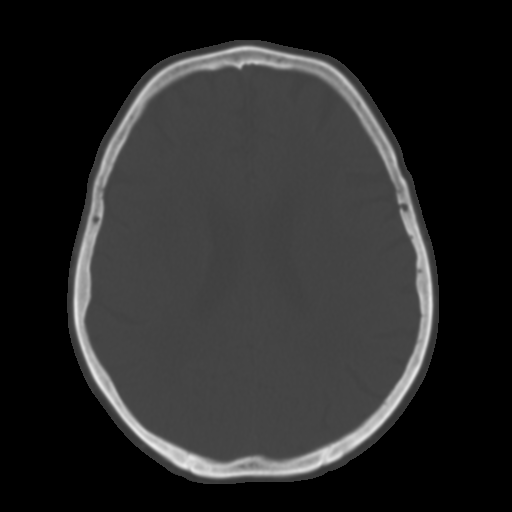
[im 21/31  brain]
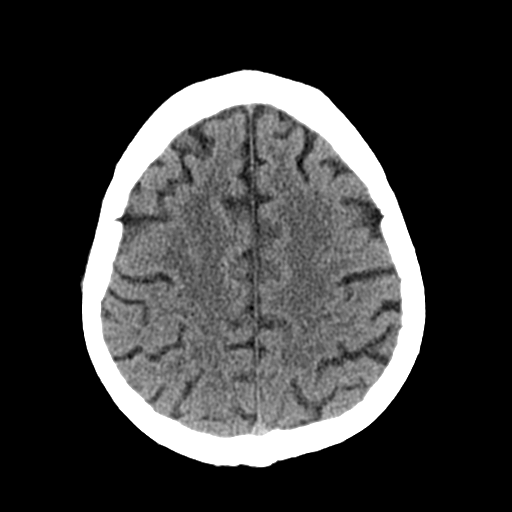
[im 24/31  brain]
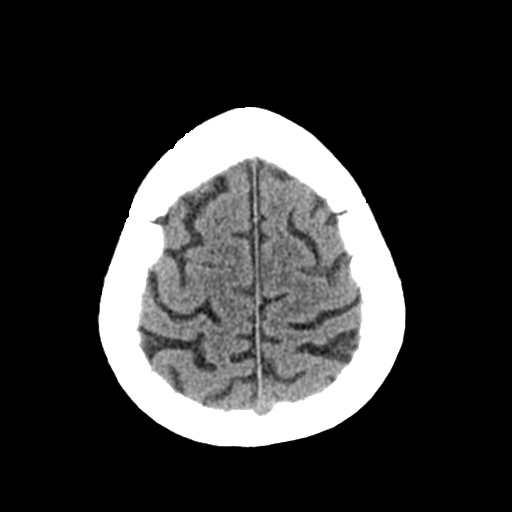
[im 28/31  brain]
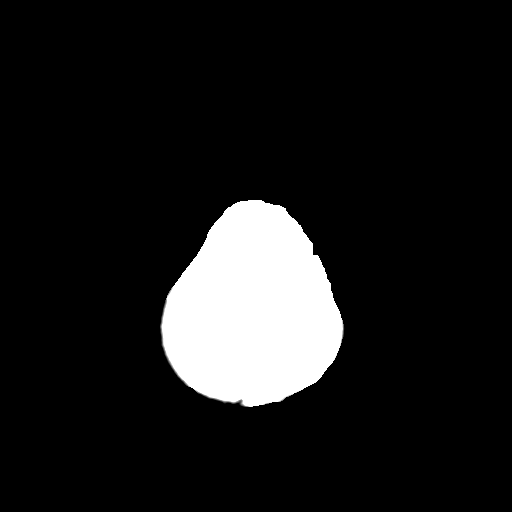

[Series 4: coronal soft tissue · coronal · 0.30mm/px · 3 of 63 slices shown]
[im 21/63  brain]
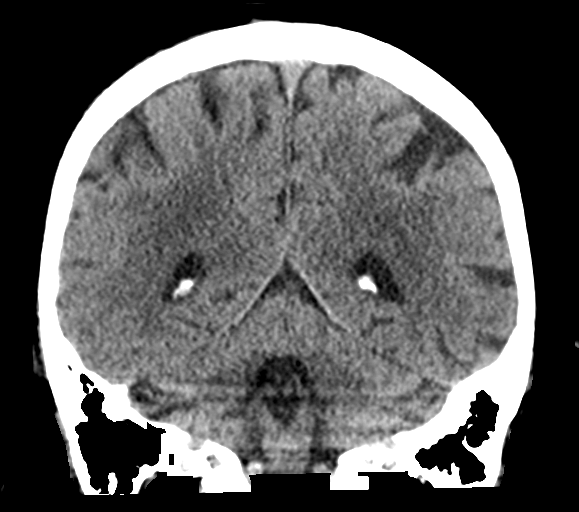
[im 28/63  brain]
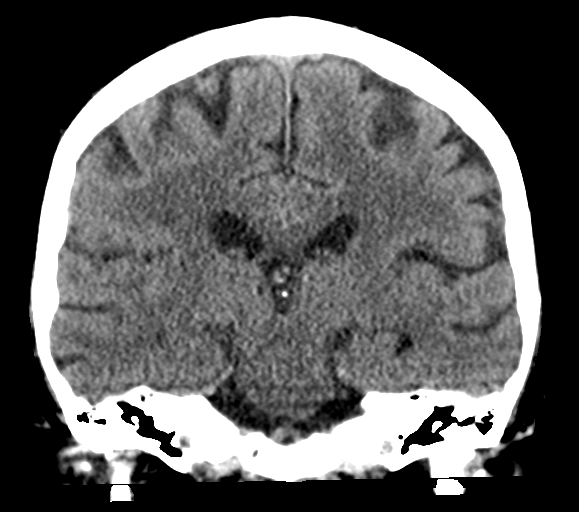
[im 35/63  brain]
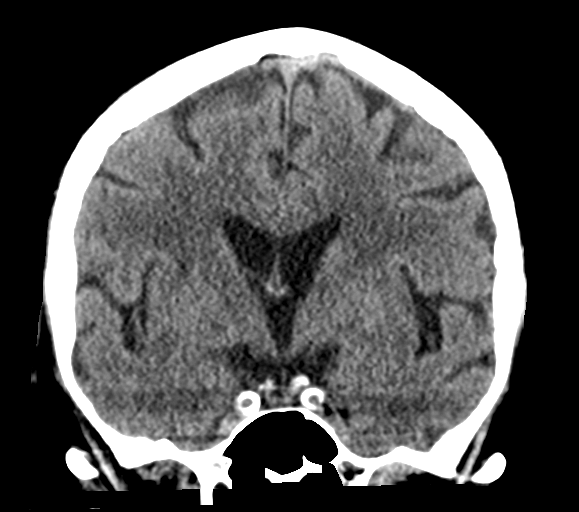

[Series 5: sagittal soft tissue · sagittal · 0.32mm/px · 3 of 55 slices shown]
[im 19/55  brain]
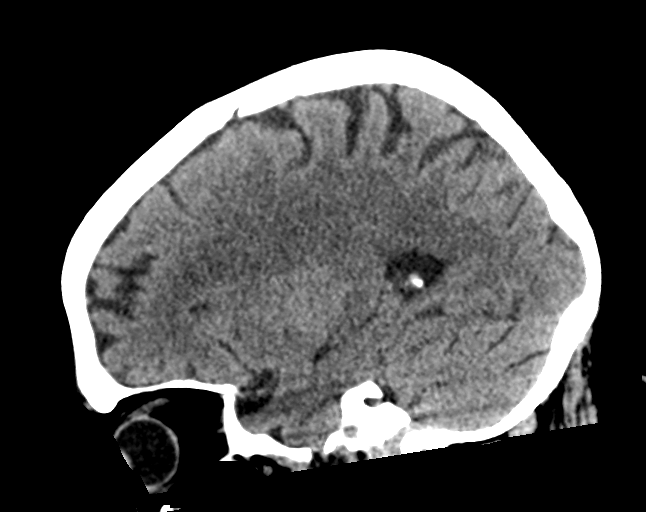
[im 28/55  brain]
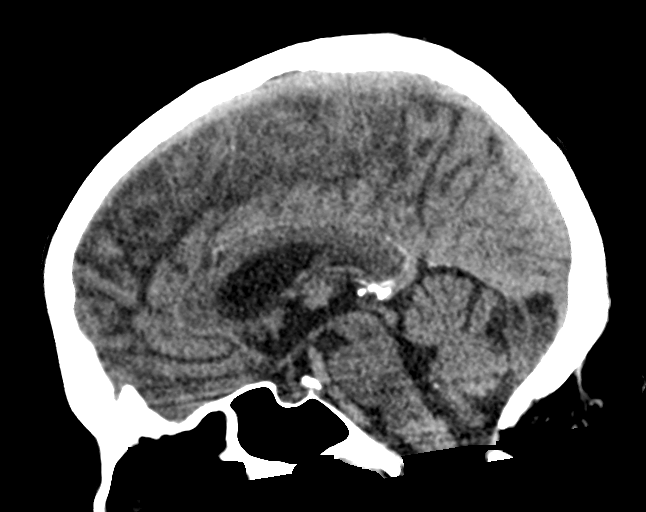
[im 37/55  brain]
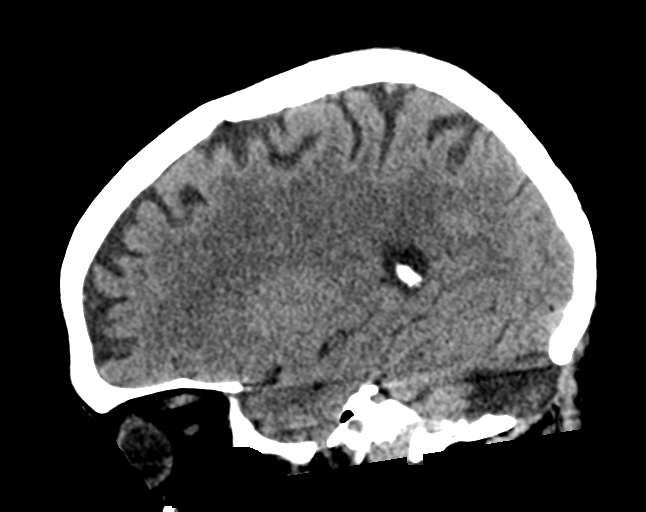

[14 of 47 positions shown; findings below may reference images not displayed]

FINDINGS: Brain: No evidence of acute territorial infarction, hemorrhage,
hydrocephalus,extra-axial collection or mass lesion/mass effect.
Prior area of encephalomalacia involving the right cerebellum. There
is dilatation the ventricles and sulci consistent with age-related
atrophy. Low-attenuation changes in the deep white matter consistent
with small vessel ischemia.

Vascular: No hyperdense vessel or unexpected calcification.

Skull: Prior right-sided sub occipital decompression is seen. No
fracture or focal lesion identified.

Sinuses/Orbits: The visualized paranasal sinuses are clear. There is
small amount of fluid seen within the bilateral mastoid air cells.
The orbits and globes intact.

Other: None

Cervical spine:

Alignment: There is straightening of the normal cervical lordosis.
There is a minimal anterolisthesis of C4 on C5.

Skull base and vertebrae: Visualized skull base is intact. No
atlanto-occipital dissociation. Ankylosis is noted at C5-C6. Again
noted is a chronic appearing fracture deformity seen at the
posterior left C7 lamina. The vertebral body heights are well
maintained. No fracture or pathologic osseous lesion seen.

Soft tissues and spinal canal: The visualized paraspinal soft
tissues are unremarkable. No prevertebral soft tissue swelling is
seen. The spinal canal is grossly unremarkable, no large epidural
collection or significant canal narrowing.

Disc levels: Multilevel cervical spine spondylosis seen with disc
height loss disc osteophyte complex and uncovertebral osteophytes
most notable C5-C6 with moderate neural foraminal narrowing and mild
central canal stenosis.

Upper chest: The lung apices are clear. Thoracic inlet is within
normal limits.

Other: None
IMPRESSION: 1. No acute intracranial abnormality.
2. Findings consistent with age related atrophy and chronic small
vessel ischemia
3. Right cerebellar encephalomalacia with suboccipital decompression
4.  No acute fracture or malalignment of the spine.
5. A chronic fracture deformity of the posterior left C7 lamina.

## 2022-07-20 ENCOUNTER — Ambulatory Visit (INDEPENDENT_AMBULATORY_CARE_PROVIDER_SITE_OTHER): Payer: Medicare Other

## 2022-07-20 DIAGNOSIS — I442 Atrioventricular block, complete: Secondary | ICD-10-CM | POA: Diagnosis not present

## 2022-07-21 LAB — CUP PACEART REMOTE DEVICE CHECK
Battery Remaining Longevity: 61 mo
Battery Remaining Percentage: 60 %
Battery Voltage: 2.98 V
Brady Statistic AP VP Percent: 1 %
Brady Statistic AP VS Percent: 1 %
Brady Statistic AS VP Percent: 99 %
Brady Statistic AS VS Percent: 1 %
Brady Statistic RA Percent Paced: 1 %
Brady Statistic RV Percent Paced: 99 %
Date Time Interrogation Session: 20231211020019
Implantable Lead Connection Status: 753985
Implantable Lead Connection Status: 753985
Implantable Lead Implant Date: 20200313
Implantable Lead Implant Date: 20200313
Implantable Lead Location: 753859
Implantable Lead Location: 753860
Implantable Lead Model: 5076
Implantable Lead Model: 5076
Implantable Pulse Generator Implant Date: 20200313
Lead Channel Impedance Value: 340 Ohm
Lead Channel Impedance Value: 430 Ohm
Lead Channel Pacing Threshold Amplitude: 0.25 V
Lead Channel Pacing Threshold Amplitude: 0.5 V
Lead Channel Pacing Threshold Pulse Width: 0.5 ms
Lead Channel Pacing Threshold Pulse Width: 0.5 ms
Lead Channel Sensing Intrinsic Amplitude: 12 mV
Lead Channel Sensing Intrinsic Amplitude: 2.9 mV
Lead Channel Setting Pacing Amplitude: 2 V
Lead Channel Setting Pacing Amplitude: 2.5 V
Lead Channel Setting Pacing Pulse Width: 0.5 ms
Lead Channel Setting Sensing Sensitivity: 4 mV
Pulse Gen Model: 2272
Pulse Gen Serial Number: 9118930

## 2022-07-30 ENCOUNTER — Inpatient Hospital Stay: Payer: Medicare Other | Admitting: Internal Medicine

## 2022-07-30 ENCOUNTER — Telehealth: Payer: Self-pay | Admitting: Internal Medicine

## 2022-07-30 NOTE — Telephone Encounter (Signed)
I called the facility and informed the nurse.  Patient was seen in October 2023 for pancytopenia.  Repeat CBC with diff showed WBC and platelets have normalized.  Hemoglobin has improved with only mild anemia.  All other workup for pancytopenia was negative.  Patient had pancytopenia when she was admitted in the hospital which was likely due to the acute illness.  And now it has recovered.  No further workup indicated at this time.  She can continue to follow with her PCP.  Patient can be referred back to hematology if pancytopenia recurs and or patient develops any concerning symptoms.

## 2022-08-11 ENCOUNTER — Ambulatory Visit
Admission: RE | Admit: 2022-08-11 | Discharge: 2022-08-11 | Disposition: A | Discharge status: Home / Self Care | Payer: Medicare Other | Source: Ambulatory Visit | Attending: Internal Medicine | Admitting: Internal Medicine

## 2022-08-11 ENCOUNTER — Inpatient Hospital Stay: Payer: Medicare Other | Attending: Internal Medicine | Admitting: Internal Medicine

## 2022-08-11 ENCOUNTER — Encounter: Payer: Self-pay | Admitting: Internal Medicine

## 2022-08-11 VITALS — BP 107/54 | HR 81 | Temp 97.6°F | Resp 20

## 2022-08-11 DIAGNOSIS — D61818 Other pancytopenia: Secondary | ICD-10-CM | POA: Insufficient documentation

## 2022-08-11 DIAGNOSIS — R634 Abnormal weight loss: Secondary | ICD-10-CM | POA: Insufficient documentation

## 2022-08-11 DIAGNOSIS — M069 Rheumatoid arthritis, unspecified: Secondary | ICD-10-CM | POA: Diagnosis not present

## 2022-08-11 DIAGNOSIS — M059 Rheumatoid arthritis with rheumatoid factor, unspecified: Secondary | ICD-10-CM | POA: Insufficient documentation

## 2022-08-11 DIAGNOSIS — R42 Dizziness and giddiness: Secondary | ICD-10-CM | POA: Insufficient documentation

## 2022-08-11 DIAGNOSIS — R233 Spontaneous ecchymoses: Secondary | ICD-10-CM | POA: Insufficient documentation

## 2022-08-11 DIAGNOSIS — M810 Age-related osteoporosis without current pathological fracture: Secondary | ICD-10-CM | POA: Diagnosis not present

## 2022-08-11 DIAGNOSIS — T148XXA Other injury of unspecified body region, initial encounter: Secondary | ICD-10-CM

## 2022-08-11 NOTE — Progress Notes (Signed)
[place]  [phone] [phone]  ID: Meghan [name] OB: 1946/01/18  MR#: [id number]  TIR#:[id number]  Patient Care Team: Garwin Brothers, MD as PCP - General (Internal Medicine) Lelon Perla, MD as Consulting Physician (Cardiology)  REFERRING PROVIDER: Philippa Sicks, NP  REASON FOR REFERRAL: pancytopenia  HPI: Meghan [name] is a 77 y.o. female with past medical history of CHF, GERD, rheumatoid arthritis on Xeljanz, diabetes, hypertension, hyperlipidemia, COPD, CAD, complete heart block status post pacemaker, depression, kyphoplasty, cholecystectomy, osteoporosis and right ankle fracture status post ORIF right tibia and fibula fracture in August 2023 who resides in skilled long-term care facility at [facility] was referred to hematology for work-up of pancytopenia.  Patient has diagnosis of rheumatoid arthritis for several years.  She follows with Dr. Eugene Gavia at [facility].  Previously she has been on methotrexate, steroids, Humira, Enbrel and Remicade.  She is on [place] for the past 2 years.  She also gets Prolia for osteoporosis.  In August 2023 patient was admitted after mechanical fall.  She was found to have right ankle fracture and underwent ORIF right tibia and fibular fracture by Dr. Mack Guise.  She also had low-grade fever which was thought from atelectasis.  She reported fall about 3 weeks ago and has swelling in her right leg and pain.  Denies improvement in her symptoms.  Reports weight loss of about 30 pounds in past 3 months.  She has appetite but does not enjoy the food that is provided at the facility.  She denies any fevers, chills, nausea, vomiting, abdominal pain, bleeding, shortness of bowel or bladder issues.  Labs reviewed. In August 2023, WBC 3.5, hemoglobin around 7-8 and platelets and low 100s.  Normal LFTs.  Interval history Patient was seen today to discuss labs for pancytopenia. She noticed some  bruising on her back.  She became emotional during the visit because she has not seen her children and grandchildren for past 2 years.  She has been following with psychiatrist every 2 weeks.  Other review of system negative.  REVIEW OF SYSTEMS:   Review of Systems  Neurological:  Positive for dizziness.    As per HPI. Otherwise, a complete review of systems is negative.  PAST MEDICAL HISTORY: Past Medical History:  Diagnosis Date   Anginal pain ([place])    Anxiety    Arthritis    RA   Asthma    Brain tumor (benign) (HCC)    CHF (congestive heart failure) (HCC)    Collagen vascular disease (HCC)    Complete heart block (HCC)    COPD (chronic obstructive pulmonary disease) (HCC)    Coronary artery disease    DDD (degenerative disc disease)    Depression    Diabetes mellitus    GERD (gastroesophageal reflux disease)    Headache    Heart murmur    Hypercholesteremia    Hypertension    Lumbar degenerative disc disease    Migraines    Obesity    Osteopenia    Osteoporosis    Pacemaker- [employer]    Pneumonia    PTSD (post-traumatic stress disorder)     PAST SURGICAL HISTORY: Past Surgical History:  Procedure Laterality Date   ABDOMINAL HYSTERECTOMY     CERVICAL FUSION     CHOLECYSTECTOMY     COLONOSCOPY WITH PROPOFOL N/A 09/20/2015   Procedure: COLONOSCOPY WITH PROPOFOL;  Surgeon: Josefine Class, MD;  Location: [facility] ENDOSCOPY;  Service: Endoscopy;  Laterality: N/A;  COLONOSCOPY WITH PROPOFOL N/A 01/27/2016   Procedure: COLONOSCOPY WITH PROPOFOL;  Surgeon: Manya Silvas, MD;  Location: [facility] ENDOSCOPY;  Service: Endoscopy;  Laterality: N/A;   FINGER ARTHROPLASTY  02/23/2012   Procedure: FINGER ARTHROPLASTY;  Surgeon: Cammie Sickle., MD;  Location: [place];  Service: Orthopedics;  Laterality: Left;  Extensor carpi radialis longus to Extensor carpi ulnaris transfer, left Metaphalangeal reconstructions of index and long fingers,   FOOT ARTHROPLASTY      toes x2 rt foot   HAND RECONSTRUCTION  2011   right-multiple finger joint reconst   JOINT REPLACEMENT     bilat knee replacements   KYPHOPLASTY N/A 06/14/2017   Procedure: KYPHOPLASTY L1;  Surgeon: Hessie Knows, MD;  Location: [facility] ORS;  Service: Orthopedics;  Laterality: N/A;   LOOP RECORDER INSERTION N/A 06/23/2017   Procedure: LOOP RECORDER INSERTION;  Surgeon: Isaias Cowman, MD;  Location: [place] [facility];  Service: Cardiovascular;  Laterality: N/A;   LOOP RECORDER REMOVAL N/A 10/21/2018   Procedure: LOOP RECORDER REMOVAL;  Surgeon: Deboraha Sprang, MD;  Location: [place] [facility];  Service: Cardiovascular;  Laterality: N/A;   OPEN REDUCTION INTERNAL FIXATION (ORIF) TIBIA/FIBULA FRACTURE Right 03/12/2022   Procedure: OPEN REDUCTION INTERNAL FIXATION (ORIF) TIBIA/FIBULA FRACTURE;  Surgeon: Thornton Park, MD;  Location: [facility] ORS;  Service: Orthopedics;  Laterality: Right;   ORIF FEMUR FRACTURE Right 10/22/2018   Procedure: OPEN REDUCTION INTERNAL FIXATION (ORIF) DISTAL FEMUR FRACTURE;  Surgeon: Marchia Bond, MD;  Location: [place];  Service: Orthopedics;  Laterality: Right;   PACEMAKER IMPLANT N/A 10/21/2018   Procedure: PACEMAKER IMPLANT;  Surgeon: Deboraha Sprang, MD;  Location: [place] [facility];  Service: Cardiovascular;  Laterality: N/A;    FAMILY HISTORY: Family History  Problem Relation Age of Onset   Depression Sister    Migraines Sister    Hypertension Mother    Arthritis/Rheumatoid Mother    Heart attack Father    Hypertension Father    CAD Other    Hypertension Other    Diabetes Mellitus II Other    Arthritis Other     HEALTH MAINTENANCE: Social History   Tobacco Use   Smoking status: Never   Smokeless tobacco: Never  Vaping Use   Vaping Use: Never used  Substance Use Topics   Alcohol use: No    Alcohol/week: 0.0 standard drinks of alcohol   Drug use: No     Allergies  Allergen Reactions   Amoxicillin Itching and Other (See Comments)     Has patient had a PCN reaction causing immediate rash, facial/tongue/throat swelling, SOB or lightheadedness with hypotension: Unknown Has patient had a PCN reaction causing severe rash involving mucus membranes or skin necrosis: Unknown Has patient had a PCN reaction that required hospitalization: Unknown Has patient had a PCN reaction occurring within the last 10 years: Unknown If all of the above answers are "NO", then may proceed with Cephalosporin use.    Gabapentin Other (See Comments)    Pt states that it causes her BP to drop.    Iodine Other (See Comments)    Reaction:  Syncopy   Naproxen Hives   Nsaids Other (See Comments)    Reaction:  Unknown    Tolmetin Other (See Comments)    Reaction:  Unknown     Current Outpatient Medications  Medication Sig Dispense Refill   acetaminophen (TYLENOL) 500 MG tablet Take 2 tablets (1,000 mg total) by mouth every 8 (eight) hours as needed for headache,  fever, moderate pain or mild pain. 30 tablet 0   albuterol (VENTOLIN HFA) 108 (90 Base) MCG/ACT inhaler Inhale 1-2 puffs into the lungs every 6 (six) hours as needed for wheezing or shortness of breath.     budesonide (PULMICORT) 0.5 MG/2ML nebulizer solution Inhale 2 mLs into the lungs daily.     busPIRone (BUSPAR) 10 MG tablet Take 10 mg by mouth 2 (two) times daily.     Calcium Carb-Cholecalciferol (CALCIUM 500/D) 500-400 MG-UNIT CHEW Chew by mouth daily.     calcium carbonate (OS-CAL - DOSED IN MG OF ELEMENTAL CALCIUM) 1250 (500 Ca) MG tablet Take 2 tablets by mouth every 2 (two) hours as needed.     carboxymethylcellul-glycerin (REFRESH OPTIVE) 0.5-0.9 % ophthalmic solution Place 1 drop into both eyes at bedtime.     Cholecalciferol 125 MCG (5000 UT) capsule Take 5,000 Units by mouth daily.     diclofenac sodium (VOLTAREN) 1 % GEL Apply 2 g topically 3 (three) times daily. Apply to the fingers     enoxaparin (LOVENOX) 40 MG/0.4ML injection Inject 0.4 mLs (40 mg total) into the skin daily.  0 mL    ergocalciferol (VITAMIN D2) 1.25 MG (50000 UT) capsule Take 1 capsule (50,000 Units total) by mouth 2 (two) times a week. X 6 weeks. 12 capsule 0   escitalopram (LEXAPRO) 20 MG tablet Take 20 mg by mouth daily.     famotidine (PEPCID) 40 MG tablet Take 40 mg by mouth daily.     ferrous sulfate 325 (65 FE) MG tablet Take 325 mg by mouth daily with breakfast.     furosemide (LASIX) 40 MG tablet Take 1 tablet (40 mg total) by mouth daily. 30 tablet    hydrOXYzine (ATARAX) 25 MG tablet Take 25 mg by mouth 3 (three) times daily as needed (pruitus).     lamoTRIgine (LAMICTAL) 100 MG tablet Take 1 tablet (100 mg total) by mouth daily. 90 tablet 3   loratadine (CLARITIN) 10 MG tablet Take 10 mg by mouth daily.     LYRICA 100 MG capsule Take 100 mg by mouth 2 (two) times daily.     mirtazapine (REMERON) 15 MG tablet Take 15 mg by mouth at bedtime.     NYAMYC powder Apply 1 Application topically 2 (two) times daily.     Polyethyl Glycol-Propyl Glycol 0.4-0.3 % SOLN Place 1 drop into both eyes at bedtime.     polyethylene glycol (MIRALAX / GLYCOLAX) 17 g packet Take 17 g by mouth daily as needed for mild constipation. 14 each 0   Potassium Chloride ER 20 MEQ TBCR Take 20 mEq by mouth daily.  0   pramipexole (MIRAPEX) 0.25 MG tablet Take 0.25 mg by mouth daily.      pravastatin (PRAVACHOL) 20 MG tablet Take 1 tablet by mouth 3 (three) times a week. Monday, Wednesday, and Friday     Tofacitinib Citrate (XELJANZ) 5 MG TABS Take 1 tablet by mouth 2 (two) times daily.     topiramate (TOPAMAX) 25 MG tablet Take 25 mg by mouth at bedtime.      docusate sodium (COLACE) 100 MG capsule Take 1 capsule (100 mg total) by mouth 2 (two) times daily as needed for mild constipation. (Patient not taking: Reported on 06/01/2022) 10 capsule 0   No current facility-administered medications for this visit.    OBJECTIVE: Vitals:   08/11/22 1320  BP: (!) 107/54  Pulse: 81  Resp: 20  Temp: 97.6 F (36.4 C)   SpO2:  100%     There is no height or weight on file to calculate BMI.      General: Well-developed, well-nourished, no acute distress. Eyes: Pink conjunctiva, anicteric sclera. HEENT: Normocephalic, moist mucous membranes, clear oropharnyx. Lungs: Clear to auscultation bilaterally. Heart: Regular rate and rhythm. No rubs, murmurs, or gallops. Abdomen: Soft, nontender, nondistended. No organomegaly noted, normoactive bowel sounds. Musculoskeletal: Right leg swelling Neuro: Alert, answering all questions appropriately. Cranial nerves grossly intact. Skin: No rashes or petechiae noted. Psych: Normal affect. Lymphatics: No cervical, calvicular, axillary or inguinal LAD. Chronic arthritis joint changes both hands  LAB RESULTS:  Lab Results  Component Value Date   NA 143 03/14/2022   K 3.9 03/16/2022   CL 108 03/14/2022   CO2 29 03/14/2022   GLUCOSE 88 03/14/2022   BUN 19 03/14/2022   CREATININE 0.64 03/16/2022   CALCIUM 8.5 (L) 03/14/2022   PROT 5.7 (L) 03/11/2022   ALBUMIN 3.4 (L) 03/11/2022   AST 18 03/11/2022   ALT 14 03/11/2022   ALKPHOS 42 03/11/2022   BILITOT 0.8 03/11/2022   GFRNONAA >60 03/16/2022   GFRAA >60 10/24/2018    Lab Results  Component Value Date   WBC 4.1 06/01/2022   NEUTROABS 3.1 06/01/2022   HGB 11.8 (L) 06/01/2022   HCT 36.5 06/01/2022   MCV 94.8 06/01/2022   PLT 156 06/01/2022    Lab Results  Component Value Date   TIBC 249 (L) 06/01/2022   FERRITIN 124 06/01/2022   IRONPCTSAT 34 (H) 06/01/2022     STUDIES: US Abdomen Complete  Result Date: 08/11/2022 CLINICAL DATA:  Assess for hepatomegaly and splenomegaly. EXAM: ABDOMEN ULTRASOUND COMPLETE COMPARISON:  CT chest abdomen pelvis October 02, 2020 FINDINGS: Gallbladder: Prior cholecystectomy. Common bile duct: Diameter: 6 mm Liver: No focal lesion identified. Lobulated liver contour. Right lobe liver measures approximately 16 cm. Within normal limits in parenchymal echogenicity. Portal vein  is patent on color Doppler imaging with normal direction of blood flow towards the liver. IVC: No abnormality visualized. Pancreas: Ultrasound technologist reports limited visualization. Spleen: Size and appearance within normal limits. Spleen measures 10 cm. Right Kidney: Length: 9.3 cm. Echogenicity within normal limits. No mass visualized. There is mild right hydronephrosis. Left Kidney: Length: 9.4 cm. Echogenicity within normal limits. No mass or hydronephrosis visualized. Abdominal aorta: No aneurysm visualized. Other findings: Prevoid bladder volume is 251 cm3 and postvoid bladder volume is 43.7 cm3. IMPRESSION: 1. No hepatosplenomegaly. 2. Prior cholecystectomy. 3. Mild right hydronephrosis. Electronically Signed   By: Abelardo Diesel M.D.   On: 08/11/2022 12:41   CUP PACEART REMOTE DEVICE CHECK  Result Date: 07/21/2022 Scheduled remote reviewed. Normal device function.  Next remote 91 days. [place]   ASSESSMENT AND PLAN:   Meghan [name] is a 77 y.o. female with pmh of past medical history of CHF, GERD, rheumatoid arthritis on Xeljanz, diabetes, hypertension, hyperlipidemia, COPD, CAD, complete heart block status post pacemaker, depression, kyphoplasty, cholecystectomy, osteoporosis and right ankle fracture status post ORIF right tibia and fibula fracture in August 2023 who resides in skilled long-term care facility at [facility] was referred to hematology for work-up of pancytopenia.  #Pancytopenia #Rheumatoid arthritis on Xeljanz -Resolved now.  Likely secondary to acute illness in August 2023 when she was admitted with right tibia and fibular fracture and required surgery. -Repeat CBC with differential showed resolution of pancytopenia. -Iron panel, B12, folate normal.  Flow cytometry normal.  Ultrasound of abdomen was unremarkable.  No further workup indicated.  Patient can  continue to follow with her primary care doctor.  If pancytopenia recurs, can refer back to hematology.  #  Bruises on back -Could be senile purpura due to location.  No other bleeding or bruising.  Advised the patient to continue to monitor.  If bruising continues, was advised in the facility recommendation form to stop Escitalopram which can sometimes cause platelet dysfunction and bruising.  RTC as needed.  Patient expressed understanding and was in agreement with this plan. She also understands that She can call clinic at any time with any questions, concerns, or complaints.   I spent a total of 30 minutes reviewing chart data, face-to-face evaluation with the patient, counseling and coordination of care as detailed above.  Jane Canary, MD   08/11/2022 2:45 PM

## 2022-08-11 NOTE — Progress Notes (Signed)
Patient states she has been light headed since the [place]

## 2022-08-17 ENCOUNTER — Non-Acute Institutional Stay: Payer: Medicare Other | Admitting: Nurse Practitioner

## 2022-08-17 VITALS — BP 114/69 | HR 86 | Temp 97.1°F | Resp 18 | Wt 129.0 lb

## 2022-08-17 DIAGNOSIS — R110 Nausea: Secondary | ICD-10-CM

## 2022-08-17 DIAGNOSIS — Z515 Encounter for palliative care: Secondary | ICD-10-CM

## 2022-08-17 DIAGNOSIS — R0602 Shortness of breath: Secondary | ICD-10-CM

## 2022-08-17 DIAGNOSIS — I509 Heart failure, unspecified: Secondary | ICD-10-CM

## 2022-08-17 NOTE — Progress Notes (Signed)
[facility] Consult Note Telephone: [phone]  Fax: [phone]    Date of encounter: 08/17/22 1:30 PM PATIENT NAME: [place], Chaunce[place] [address]   [phone] (home)  DOB: Apr 03, 1946 MRN: [id number] PRIMARY CARE PROVIDER:    [place]:    Contact Information     Name Relation Home Work [place], [place] Relative   [phone]   Azzie Glatter   [place], [place] Daughter   [phone]         I met face to face with patient in facility. Palliative Care was asked to follow this patient by consultation request of  [place] to address advance care planning and complex medical decision making. This is a follow up visit.                                  ASSESSMENT AND PLAN / RECOMMENDATIONS:  Symptom Management/Plan: 1. ACP: Medical goals consists DNR, do not intubate. Wishes are for more conservative care to include antibiotics, IV fluids, blood transfusions, diagnostic testing, lab testing. Wishes are to minimize hospitalizations if necessary.    2. Shortness of breath stable secondary to congestive heart failure/COPD Continue daily weights and outpatient Cardiology appointments, discussed nebulizers with inhalation therapy.   3. Palliative care encounter; Palliative medicine team will continue to support patient, patient's family, and medical team. Visit consisted of counseling and education dealing with the complex and emotionally intense issues of symptom management.   4. Chronic nausea; improved, revisited appetite has improved, nutrition discussed and educated, food she likes, best foods   5. Chronic diarrhea; resolved;    Follow up Palliative Care Visit: Palliative care will continue to follow for complex medical decision making, advance care planning, and clarification of goals. Return 4 to 8 weeks or prn.    I spent 47 minutes providing this consultation at 12:30 pm. More than 50% of the time in this consultation was spent in counseling and care coordination.   PPS: 50%   Chief Complaint: Follow up palliative consult for complex medical decision making   HISTORY OF PRESENT ILLNESS:  ROYCE SCIARA is a 77 y.o. year old female  with  multiple medical problems including COPD, congestive heart failure with ef 20%, asthma, collagen vascular disease, coronary artery disease, heart murmur, benign brain tumor, hypertension, gerd, hypercholesterolemia, history of headache, diabetes, gerd, arthritis, osteoporosis, lumbar degenerative disc disease, anxiety, PTSD, depression, kyphoplasty, right hand reconstruction 2011, cholecystectomy, cervical fusion, abdominal hysterectomy. Ms Raybon continues to reside in [place] at [facility] center. Hospitalized 03/11/2022 to 03/16/2022 following a fall with right ankle fx s/p ORIF right tibia and fibula fractures. Ms. Fleeman was stabilized and d/c back to [facility] where she resides LTC. Ms. Tessler does require assistance with ADL's, transfers though is mobile in w/c. Ms Loewe had a recent fall per staff. No other concerns. At present Ms Sweeting is lying in bed, appears comfortable. Ms Bascom and I talked about purpose of pc visit, ros, appetite, reviewed weights, chronic nausea which is minimal, now infrequently. We talked about chronic diarrhea which has resolved. We talked about daily routine. We talked about things that bring her joy, such as her computer. We talked about residing at facility. We talked about medical goals, poc, medications. No new changes. We talked functional  abilities, recent fall with fall precautions. Discussed chronic disease overall, currently stable. Supportive visit, no new changes recommended today. Updated staff.    History obtained from review of EMR, discussion with facility staff and [name].  I reviewed available labs,  medications, imaging, studies and related documents from the EMR.  Records reviewed and summarized above.  Physical Exam: Constitutional: NAD General: pleasant female ENMT: oral mucous membranes moist CV: S1S2, RRR Pulmonary: breath sounds clear Abdomen: soft and non tender MSK: cast to right lower leg, +pulse Skin: warm and dry Neuro:  + generalized weakness,  no cognitive impairment Psych: non-anxious affect, A and O x 3 Thank you for the opportunity to participate in the care of [name]. Please call our office at [phone] if we can be of additional assistance.   Alianah Lofton Ihor Gully, NP

## 2022-08-24 NOTE — Progress Notes (Signed)
Remote pacemaker transmission.   

## 2022-09-21 ENCOUNTER — Encounter: Payer: Self-pay | Admitting: Nurse Practitioner

## 2022-09-21 ENCOUNTER — Non-Acute Institutional Stay: Payer: Medicare Other | Admitting: Nurse Practitioner

## 2022-09-21 VITALS — BP 130/70 | HR 83 | Temp 97.3°F | Resp 18 | Wt 129.0 lb

## 2022-09-21 DIAGNOSIS — Z515 Encounter for palliative care: Secondary | ICD-10-CM

## 2022-09-21 DIAGNOSIS — I509 Heart failure, unspecified: Secondary | ICD-10-CM

## 2022-09-21 DIAGNOSIS — F419 Anxiety disorder, unspecified: Secondary | ICD-10-CM

## 2022-09-21 DIAGNOSIS — R0602 Shortness of breath: Secondary | ICD-10-CM

## 2022-09-21 NOTE — Progress Notes (Signed)
[facility] Consult Note Telephone: [phone]  Fax: [phone]    Date of encounter: 09/21/22 1:29 PM PATIENT NAME: [place], [place] [address]   [phone] (home)  DOB: 1946/07/20 MRN: [id number] PRIMARY CARE PROVIDER:    [place]:    Contact Information     Name Relation Home Work [name], [place] Relative   [phone]   Azzie Glatter   [place], [place] Daughter   [phone]     I met face to face with patient in facility. Palliative Care was asked to follow this patient by consultation request of  [place] to address advance care planning and complex medical decision making. This is a follow up visit.                                  ASSESSMENT AND PLAN / RECOMMENDATIONS:  Symptom Management/Plan: 1. ACP: Medical goals consists DNR, do not intubate. Wishes are for more conservative care to include antibiotics, IV fluids, blood transfusions, diagnostic testing, lab testing. Wishes are to minimize hospitalizations if necessary.    2. Shortness of breath stable secondary to congestive heart failure/COPD Continue daily weights and outpatient Cardiology appointments, discussed nebulizers with inhalation therapy.  09/16/2022 weight 129 lbs   3. Palliative care encounter; Palliative medicine team will continue to support patient, patient's family, and medical team. Visit consisted of counseling and education dealing with the complex and emotionally intense issues of symptom management.   4. Anxiety; reviewed with Ms Gingery frustrations, medications, coping strategies, alternative modalities continue with psych/psychotherapy. Support provided.    Follow up Palliative Care Visit: Palliative care will continue to follow for complex medical decision making, advance care planning, and clarification of goals. Return 4 to  8 weeks or prn.   I spent 48 minutes providing this consultation at 12:30 pm. More than 50% of the time in this consultation was spent in counseling and care coordination.   PPS: 50%   Chief Complaint: Follow up palliative consult for complex medical decision making   HISTORY OF PRESENT ILLNESS:  Meghan [name] is a 77 y.o. year old female  with  multiple medical problems including COPD, congestive heart failure with ef 20%, asthma, collagen vascular disease, coronary artery disease, heart murmur, benign brain tumor, hypertension, gerd, hypercholesterolemia, history of headache, diabetes, gerd, arthritis, osteoporosis, lumbar degenerative disc disease, anxiety, PTSD, depression, kyphoplasty, right hand reconstruction 2011, cholecystectomy, cervical fusion, abdominal hysterectomy. Ms Vanderbush continues to reside in [place] at [facility] center. Ms Kipp resides at [facility]. Ms. Hummingbird does require assistance with ADL's, transfers, w/c. Ms Sweetland does feed herself with varied appetite depending on what is being served. Purpose of today PC f/u visit further discussion monitor trends of appetite, weights, monitor for functional, cognitive decline with chronic disease progression, assess any active symptoms, supportive role. At present Ms Shady is sitting on the side of the bed, feeding herself lunch. We talked about purpose of pc visit, ros, functional abilities. We talked about using weighted utensils. We talked about weights. We talked about residing at facility, quality of life, what brings her joy. We talked about symptoms of frustration and at times lashing out at staff. We talked about her feelings, she continues to be followed by psych/psychotherapy. We talked about coping strategies. We  talked about upcoming visit with cardiology. We talked about medications, medical goals, poc. Supportive visit. Reviewed modalities for coping with frustrations, anxiety.    History  obtained from review of EMR, discussion with facility staff and [name].  I reviewed available labs, medications, imaging, studies and related documents from the EMR.  Records reviewed and summarized above.  Physical Exam: Constitutional: NAD General: pleasant female ENMT: oral mucous membranes moist CV: S1S2, RRR Pulmonary: breath sounds clear Neuro:  no generalized weakness,  no cognitive impairment Psych: non-anxious affect, A and O x 3  Thank you for the opportunity to participate in the care of [name]. Please call our office at [phone] if we can be of additional assistance.   Kable Haywood Ihor Gully, NP

## 2022-10-19 ENCOUNTER — Ambulatory Visit: Payer: Medicare Other

## 2022-10-19 DIAGNOSIS — I442 Atrioventricular block, complete: Secondary | ICD-10-CM

## 2022-10-20 LAB — CUP PACEART REMOTE DEVICE CHECK
Battery Remaining Longevity: 59 mo
Battery Remaining Percentage: 57 %
Battery Voltage: 2.98 V
Brady Statistic AP VP Percent: 1 %
Brady Statistic AP VS Percent: 0 %
Brady Statistic AS VP Percent: 99 %
Brady Statistic AS VS Percent: 1 %
Brady Statistic RA Percent Paced: 1 %
Brady Statistic RV Percent Paced: 99 %
Date Time Interrogation Session: 20240311030014
Implantable Lead Connection Status: 753985
Implantable Lead Connection Status: 753985
Implantable Lead Implant Date: 20200313
Implantable Lead Implant Date: 20200313
Implantable Lead Location: 753859
Implantable Lead Location: 753860
Implantable Lead Model: 5076
Implantable Lead Model: 5076
Implantable Pulse Generator Implant Date: 20200313
Lead Channel Impedance Value: 350 Ohm
Lead Channel Impedance Value: 430 Ohm
Lead Channel Pacing Threshold Amplitude: 0.5 V
Lead Channel Pacing Threshold Amplitude: 0.75 V
Lead Channel Pacing Threshold Pulse Width: 0.5 ms
Lead Channel Pacing Threshold Pulse Width: 0.5 ms
Lead Channel Sensing Intrinsic Amplitude: 12 mV
Lead Channel Sensing Intrinsic Amplitude: 2.2 mV
Lead Channel Setting Pacing Amplitude: 2 V
Lead Channel Setting Pacing Amplitude: 2.5 V
Lead Channel Setting Pacing Pulse Width: 0.5 ms
Lead Channel Setting Sensing Sensitivity: 4 mV
Pulse Gen Model: 2272
Pulse Gen Serial Number: 9118930

## 2022-11-23 ENCOUNTER — Non-Acute Institutional Stay: Payer: Medicare Other | Admitting: Nurse Practitioner

## 2022-11-23 VITALS — BP 136/76 | HR 80 | Temp 97.9°F | Resp 18 | Wt 129.5 lb

## 2022-11-23 DIAGNOSIS — R0602 Shortness of breath: Secondary | ICD-10-CM

## 2022-11-23 DIAGNOSIS — Z515 Encounter for palliative care: Secondary | ICD-10-CM

## 2022-11-23 DIAGNOSIS — F4321 Adjustment disorder with depressed mood: Secondary | ICD-10-CM

## 2022-11-23 DIAGNOSIS — I509 Heart failure, unspecified: Secondary | ICD-10-CM

## 2022-11-23 NOTE — Progress Notes (Signed)
Therapist, nutritional Palliative Care Consult Note Telephone: [phone]  Fax: [phone]    Date of encounter: 11/23/22 9:15 PM [name] [address] [place] [address]   [phone] (home)  DOB: 07-06-1946 MRN: [id number] PRIMARY CARE PROVIDER:    [facility]  RESPONSIBLE PARTY:    Contact Information     Name Relation Home Work Christine, [place] Relative   [phone]   Carmine Savoy   [phone]   Juanda Crumble Daughter   [phone]        I met face to face with patient in facility. Palliative Care was asked to follow this patient by consultation request of  [facility] to address advance care planning and complex medical decision making. This is a follow up visit.                                  ASSESSMENT AND PLAN / RECOMMENDATIONS:  Symptom Management/Plan: 1. ACP: Medical goals consists DNR, do not intubate. Wishes are for more conservative care to include antibiotics, IV fluids, blood transfusions, diagnostic testing, lab testing. Wishes are to minimize hospitalizations if necessary.    2. Shortness of breath stable secondary to congestive heart failure/COPD Continue daily weights and outpatient Cardiology appointments, discussed nebulizers with inhalation therapy.   09/16/2022 weight 129 lbs 11/12/2022 wegith 129.5 lbs  3. Palliative care encounter; Palliative medicine team will continue to support patient, patient's family, and medical team. Visit consisted of counseling and education dealing with the complex and emotionally intense issues of symptom management.   4. Grief reaction; discussed at length the passing of her granddaughter, coping strategies, offered grief counseling, wanted to wait.    Follow up Palliative Care Visit: Palliative care will continue to follow for complex medical decision making, advance care planning, and clarification of goals.  Return 4 to 8 weeks or prn.   I spent 45 minutes providing this consultation. More than 50% of the time in this consultation was spent in counseling and care coordination.   PPS: 50%   Chief Complaint: Follow up palliative consult for complex medical decision making   HISTORY OF PRESENT ILLNESS:  Meghan [name] is a 77 y.o. year old female  with  multiple medical problems including COPD, congestive heart failure with ef 20%, asthma, collagen vascular disease, coronary artery disease, heart murmur, benign brain tumor, hypertension, gerd, hypercholesterolemia, history of headache, diabetes, gerd, arthritis, osteoporosis, lumbar degenerative disc disease, anxiety, PTSD, depression, kyphoplasty, right hand reconstruction 2011, cholecystectomy, cervical fusion, abdominal hysterectomy. Meghan [name] continues to reside in [facility] center. Meghan [name] resides at [facility]. Meghan. [name] does require assistance with ADL's, transfers, w/c. Meghan [name] does feed herself with varied appetite depending on what is being served. Purpose of today PC f/u visit further discussion monitor trends of appetite, weights, monitor for functional, cognitive decline with chronic disease progression, assess any active symptoms, supportive role. At present Meghan [name] is sitting in the w/c in her room. Meghan [name] endorses he granddaughter died from a drug overdose 2 weeks ago. We talked about grief extensively, how she feels and coping. We talked about ros, functional ability, appetite, weights, pain, sob, quality of life. Most of PC visit focused on grief. Medications reviewed, poc, goc. Purpose of today PC f/u visit further discussion monitor trends of appetite, weights, monitor  for functional, cognitive decline with chronic disease progression, assess any active symptoms, supportive role.   History obtained from review of EMR, discussion with facility staff and Meghan [name].  I reviewed available labs,  medications, imaging, studies and related documents from the EMR.  Records reviewed and summarized above.  Physical Exam: General: pleasant female ENMT: oral mucous membranes moist CV: S1S2, RRR Pulmonary: breath sounds clear Neuro:  no generalized weakness,  no cognitive impairment Psych: non-anxious affect, A and O x 3  Thank you for the opportunity to participate in the care of Meghan [name]. Please call our office at [phone] if we can be of additional assistance.  Meghan Mcquiston NP-C

## 2022-11-25 NOTE — Progress Notes (Signed)
Remote pacemaker transmission.   

## 2022-12-05 IMAGING — US US CAROTID DUPLEX BILAT
1 series · 13 of 24 positions shown · non-contrast
Comparison: None.

CLINICAL DATA: Retinal artery branch occlusion of the right eye

EXAM:
BILATERAL CAROTID DUPLEX ULTRASOUND
TECHNIQUE: Gray scale imaging, color Doppler and duplex ultrasound were
performed of bilateral carotid and vertebral arteries in the neck.

[Series 1: us carotid duplex bilat · 0.08mm/px · 13 of 71 slices shown]
[im 1/71]
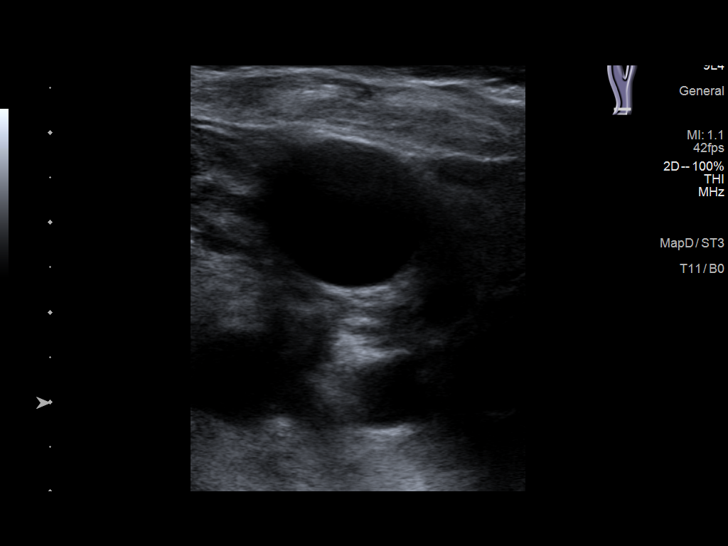
[im 7/71]
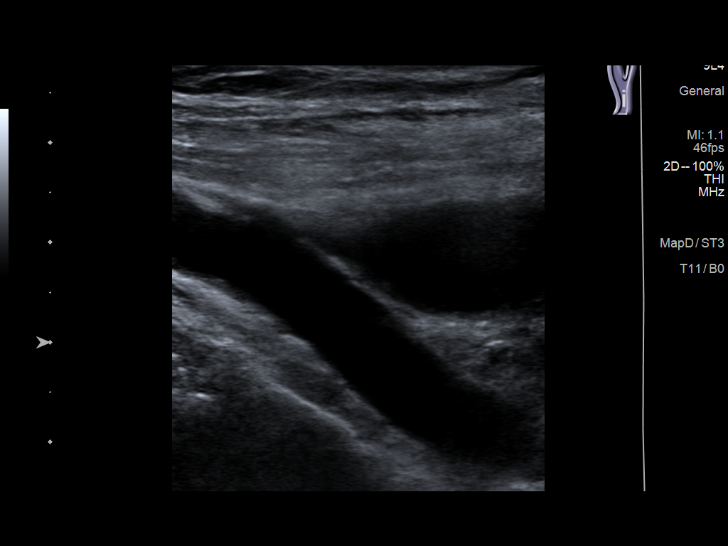
[im 13/71]
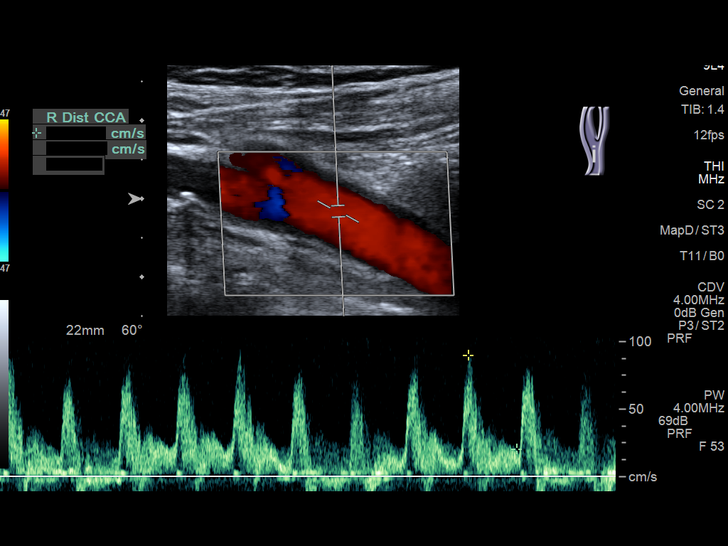
[im 19/71]
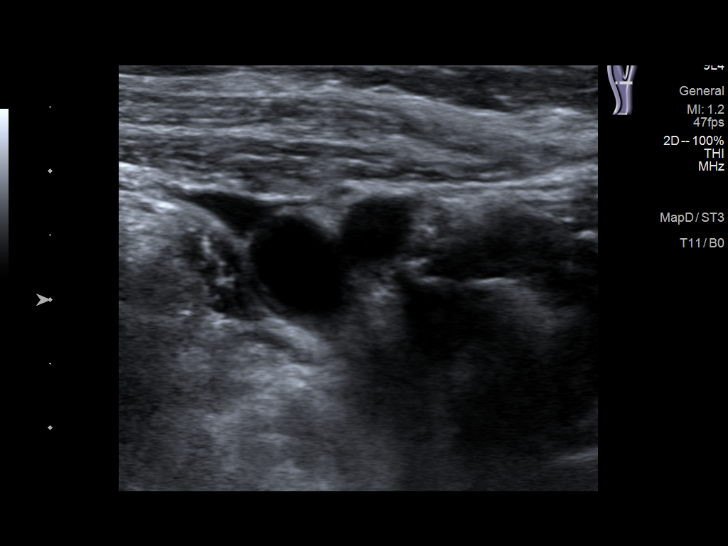
[im 25/71]
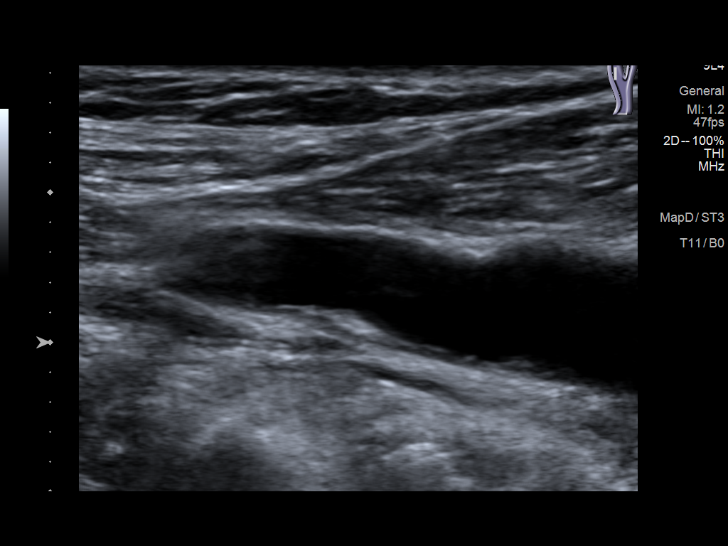
[im 31/71]
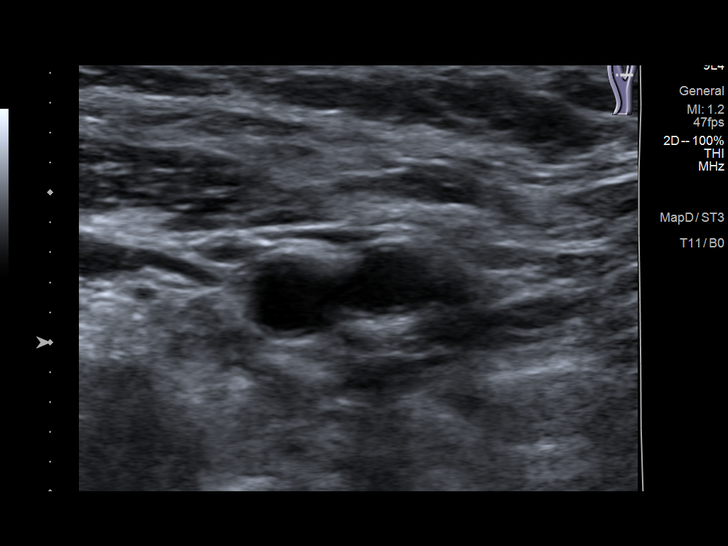
[im 37/71]
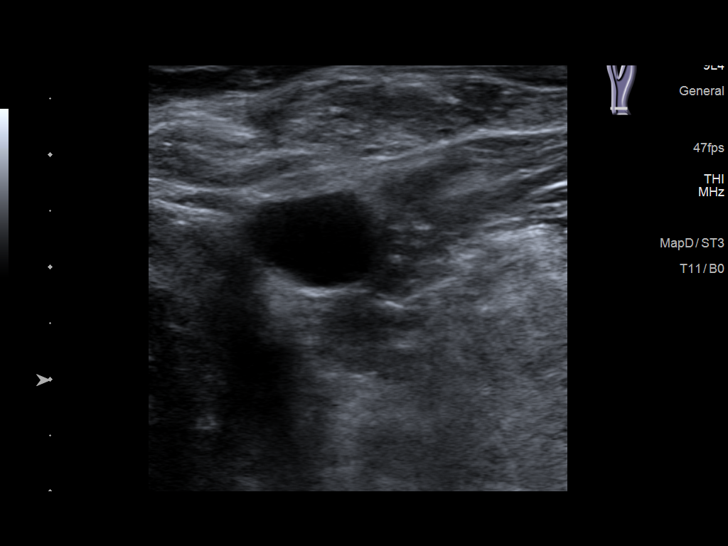
[im 40/71]
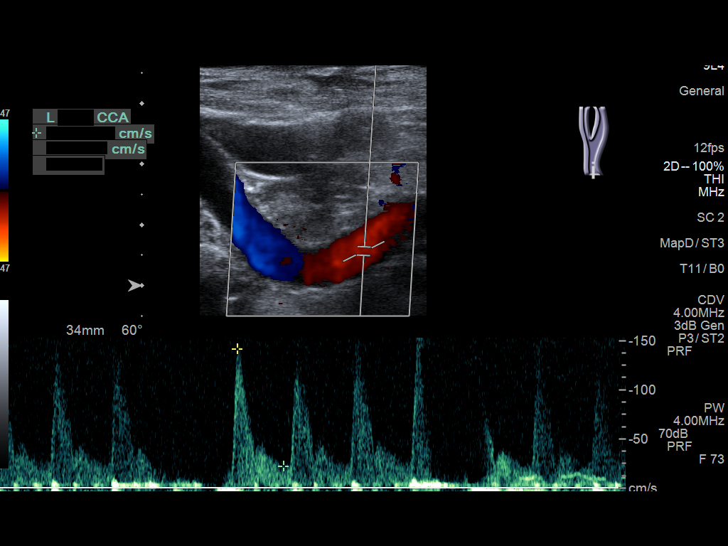
[im 46/71]
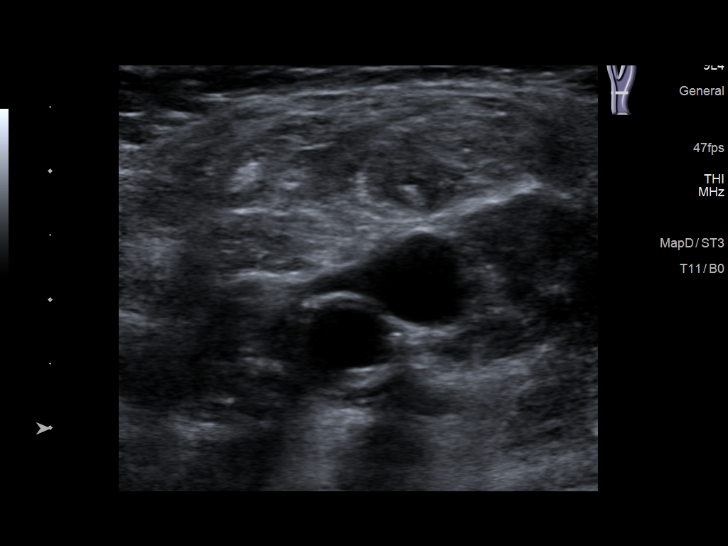
[im 52/71]
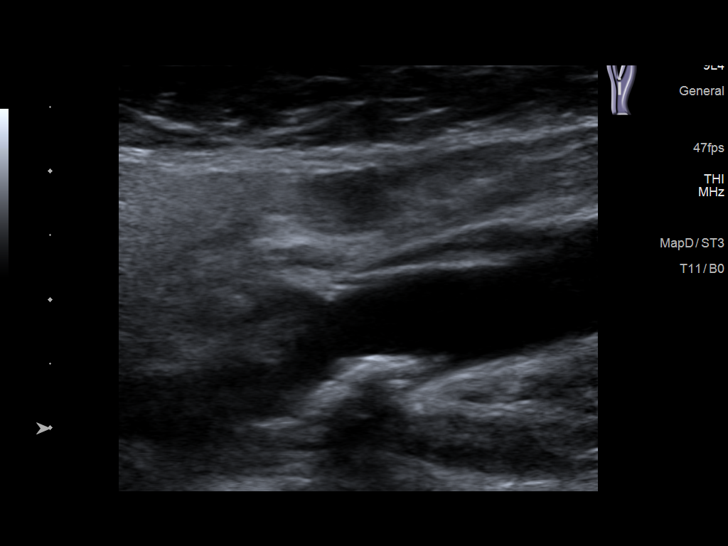
[im 58/71]
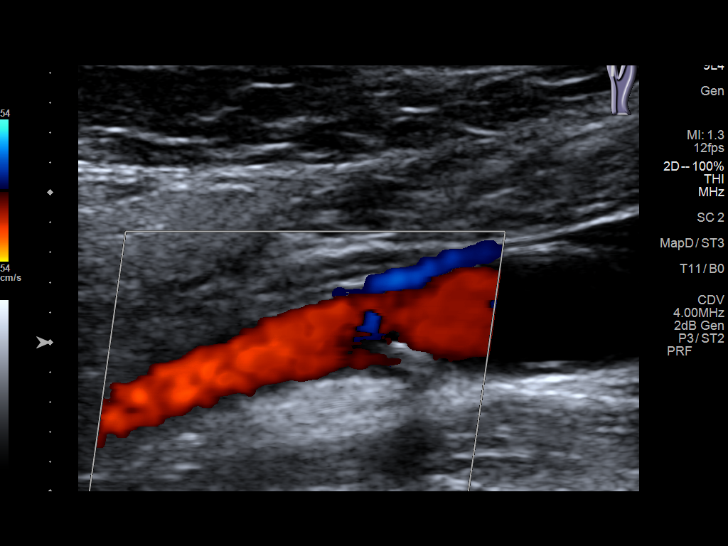
[im 64/71]
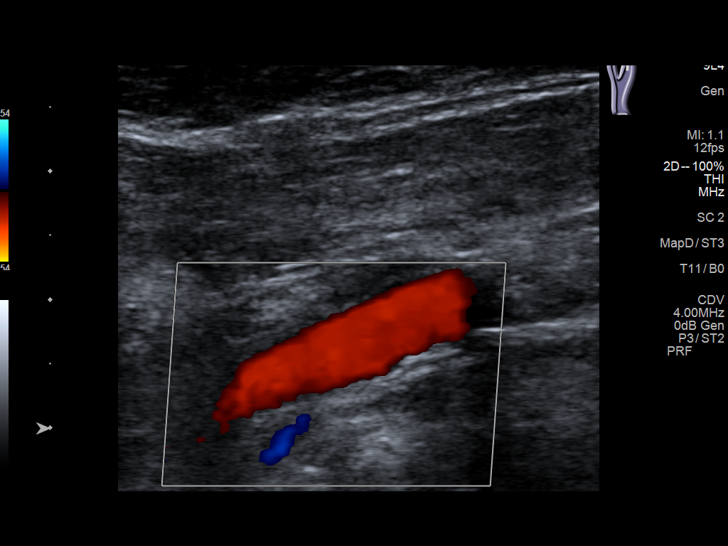
[im 71/71]
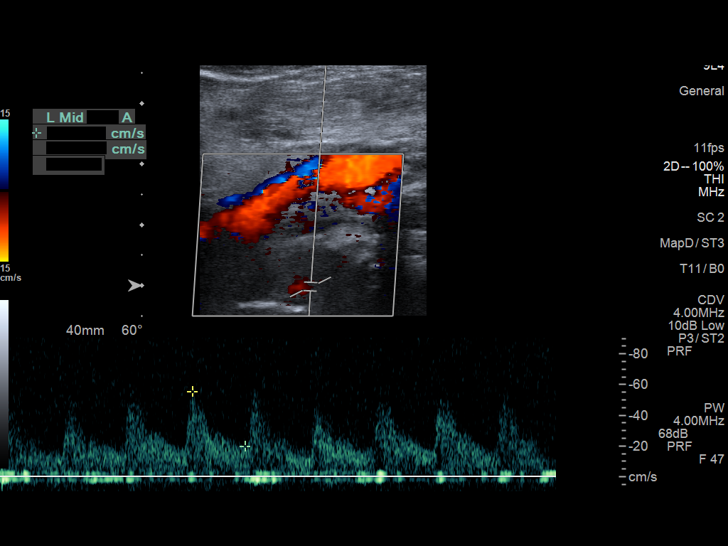

[13 of 24 positions shown; findings below may reference images not displayed]

FINDINGS: Criteria: Quantification of carotid stenosis is based on velocity
parameters that correlate the residual internal carotid diameter
with NASCET-based stenosis levels, using the diameter of the distal
internal carotid lumen as the denominator for stenosis measurement.

The following velocity measurements were obtained:

RIGHT

ICA: 88/25 cm/sec

CCA: 99/25 cm/sec

SYSTOLIC ICA/CCA RATIO:

ECA: 127 cm/sec

LEFT

ICA: 105/35 cm/sec

CCA: 126/29 cm/sec

SYSTOLIC ICA/CCA RATIO:

ECA: 120 cm/sec

RIGHT CAROTID ARTERY: Mild atherosclerotic plaque in the right
carotid bulb extending into the internal carotid artery results in
less than 50% stenosis.

RIGHT VERTEBRAL ARTERY:  Antegrade flow.

LEFT CAROTID ARTERY: Mild atherosclerotic plaque in the left carotid
bulb extending into the internal carotid artery results in less than
50% stenosis.

LEFT VERTEBRAL ARTERY:  Antegrade flow.
IMPRESSION: No evidence of hemodynamically significant stenosis involving either
the right or left carotid circulation in the neck by Doppler
criteria. There is bilateral atherosclerotic plaque involving the
carotid bulbs which results in less than 50% stenosis.

## 2022-12-08 ENCOUNTER — Encounter: Payer: Self-pay | Admitting: Nurse Practitioner

## 2022-12-08 ENCOUNTER — Non-Acute Institutional Stay: Payer: Medicare Other | Admitting: Nurse Practitioner

## 2022-12-08 DIAGNOSIS — I509 Heart failure, unspecified: Secondary | ICD-10-CM

## 2022-12-08 DIAGNOSIS — Z515 Encounter for palliative care: Secondary | ICD-10-CM

## 2022-12-08 DIAGNOSIS — F4321 Adjustment disorder with depressed mood: Secondary | ICD-10-CM

## 2022-12-08 DIAGNOSIS — R0602 Shortness of breath: Secondary | ICD-10-CM

## 2022-12-08 DIAGNOSIS — J449 Chronic obstructive pulmonary disease, unspecified: Secondary | ICD-10-CM

## 2022-12-08 NOTE — Progress Notes (Signed)
Therapist, nutritional Palliative Care Consult Note Telephone: [phone]  Fax: [phone]    Date of encounter: 12/08/22 11:07 AM [name] [address] [place] [address]   [phone] (home)  DOB: 30-Aug-1945 MRN: [id number] PRIMARY CARE PROVIDER:    Gwenevere Ghazi NP; [facility]  RESPONSIBLE PARTY:    Contact Information     Name Relation Home Work Metzger, [place] Relative   [phone]   Carmine Savoy   [phone]   Juanda Crumble Daughter   [phone]         I met face to face with patient in facility. Palliative Care was asked to follow this patient by consultation request of  [facility] to address advance care planning and complex medical decision making. This is a follow up visit.                                  ASSESSMENT AND PLAN / RECOMMENDATIONS:  Symptom Management/Plan: 1. ACP: Medical goals consists DNR, do not intubate. Wishes are for more conservative care to include antibiotics, IV fluids, blood transfusions, diagnostic testing, lab testing. Wishes are to minimize hospitalizations if necessary.  Discussed hospice with Meghan [name] in agreement to proceed with hospice evaluation: Reasons for hospice.   Recurrent pna's with COPD exacerbations; worsening wheezing continuous with any type of exertion; weight gain due to recurrent CHF exacerbations;   Declined appetite though not reflective in weight loss due to edema; intermit choking with meals, takes a >30 minutes to eat due to stopping from sob/wheezing  09/16/2022 weight 129 lbs 11/12/2022 weight 129.5 lbs  11/30/2022 weight 133 lbs Gain; edema with CHF/CAD  Functional decline; rheumatoid arthritis has made it more difficult to dress, bath herself, more difficulty with feeding herself requiring weighted utensils.   6 months ago PPS 50% Currently PPS 40%  Granddaughter just passed away suddenly     2.  Shortness of breath stable secondary to congestive heart failure/COPD/CAD Continue daily weights, reviewed. Discussed nebulizers with inhalation therapy. Reviewed recurrent exacerbations of COPD, CHF episodes, wishes are to avoid hospitalizations stay at the facility.    3. Grief reaction; revisited grief, coping strategies with loss of her granddaughter.  I spent 61 minutes providing this consultation. More than 50% of the time in this consultation was spent in counseling and care coordination. PPS: 40%   Chief Complaint: Follow up palliative consult for complex medical decision making   HISTORY OF PRESENT ILLNESS:  Meghan [name] is a 77 y.o. year old female  with  multiple medical problems including COPD, congestive heart failure; complete HB s/p PM, asthma, collagen vascular disease, coronary artery disease, heart murmur, Mitral regurgitation, moderate to severe, benign brain tumor, seizures, hypertension, gerd, hypercholesterolemia, history of headache, diabetes, gerd, arthritis, osteoporosis, lumbar degenerative disc disease, anxiety, PTSD, depression, kyphoplasty, right hand reconstruction 2011, cholecystectomy, cervical fusion, abdominal hysterectomy. Meghan [name] continues to reside in [facility] center. Meghan [name] has moved from [facility] to [facility] to ALF. Meghan [name] does use a walker with ambulation, she does require assistance with ADL's, bathing, dressing as she has rheumatoid arthritis resulting in debility; cardiomyopathy with recurrent CHF exacerbation resulting in sob with rest, exertion. On chronic O2. Meghan [name] does feed herself with declined appetite. I visited and observed Meghan [name] in her room at  [facility]. She ambulated to her room, very slow gait stopping multiple times due to sob to rest. We talked about transition to [facility], Meghan [name] endorses she is feeling good about it. We talked at length about sob, symptoms of  pain and appetite. We talked about overall decline, debility in the setting of chronic disease CM/CHF/COPD, O2 dependent. We talked about medications, goc, poc. Discussed option of hospice services under Medicare benefit, what services are provided. Meghan [name] is interested in having a hospice evaluation, notified Publishing rights manager at [employer] and Gwenevere Ghazi NP all in agreement, will send order. Updated Meghan [name]. Therapeutic listening, emotional support provided. Questions answered.   Medications reviewed, poc, goc. Purpose of today PC f/u visit further discussion monitor trends of appetite, weights, monitor for functional, cognitive decline with chronic disease progression, assess any active symptoms, supportive role.   History obtained from review of EMR, discussion with facility staff and Meghan. Helmut [name].  I reviewed available labs, medications, imaging, studies and related documents from the EMR.  Records reviewed and summarized above.  Physical Exam: General: pleasant female, debilitated ENMT: oral mucous membranes moist CV: S1S2, RRR Pulmonary: breath sounds decreased, fine crackles Neuro:  + generalized weakness,  no cognitive impairment Psych: non-anxious affect, A and O x 3 Thank you for the opportunity to participate in the care of Meghan. Helmut [name]. Please call our office at [phone] if we can be of additional assistance.   Meghan Granville Prince [place], NP

## 2022-12-29 ENCOUNTER — Other Ambulatory Visit: Payer: Self-pay

## 2022-12-29 ENCOUNTER — Emergency Department

## 2022-12-29 ENCOUNTER — Encounter: Payer: Self-pay | Admitting: Emergency Medicine

## 2022-12-29 ENCOUNTER — Encounter (HOSPITAL_COMMUNITY): Payer: Self-pay

## 2022-12-29 ENCOUNTER — Emergency Department
Admission: EM | Admit: 2022-12-29 | Discharge: 2022-12-30 | Disposition: A | Discharge status: Home / Self Care | Attending: Emergency Medicine | Admitting: Emergency Medicine

## 2022-12-29 DIAGNOSIS — M9711XA Periprosthetic fracture around internal prosthetic right knee joint, initial encounter: Secondary | ICD-10-CM | POA: Diagnosis not present

## 2022-12-29 DIAGNOSIS — W010XXA Fall on same level from slipping, tripping and stumbling without subsequent striking against object, initial encounter: Secondary | ICD-10-CM | POA: Insufficient documentation

## 2022-12-29 DIAGNOSIS — S82831A Other fracture of upper and lower end of right fibula, initial encounter for closed fracture: Secondary | ICD-10-CM | POA: Diagnosis not present

## 2022-12-29 DIAGNOSIS — I5043 Acute on chronic combined systolic (congestive) and diastolic (congestive) heart failure: Secondary | ICD-10-CM | POA: Insufficient documentation

## 2022-12-29 DIAGNOSIS — S82191A Other fracture of upper end of right tibia, initial encounter for closed fracture: Secondary | ICD-10-CM | POA: Insufficient documentation

## 2022-12-29 DIAGNOSIS — S8991XA Unspecified injury of right lower leg, initial encounter: Secondary | ICD-10-CM | POA: Diagnosis present

## 2022-12-29 DIAGNOSIS — W19XXXA Unspecified fall, initial encounter: Secondary | ICD-10-CM

## 2022-12-29 DIAGNOSIS — E119 Type 2 diabetes mellitus without complications: Secondary | ICD-10-CM | POA: Diagnosis not present

## 2022-12-29 DIAGNOSIS — S0990XA Unspecified injury of head, initial encounter: Secondary | ICD-10-CM | POA: Diagnosis not present

## 2022-12-29 DIAGNOSIS — I110 Hypertensive heart disease with heart failure: Secondary | ICD-10-CM | POA: Diagnosis not present

## 2022-12-29 DIAGNOSIS — J45901 Unspecified asthma with (acute) exacerbation: Secondary | ICD-10-CM | POA: Diagnosis not present

## 2022-12-29 DIAGNOSIS — S82101A Unspecified fracture of upper end of right tibia, initial encounter for closed fracture: Secondary | ICD-10-CM

## 2022-12-29 LAB — CBC WITH DIFFERENTIAL/PLATELET
Abs Immature Granulocytes: 0.02 10*3/uL (ref 0.00–0.07)
Basophils Absolute: 0 10*3/uL (ref 0.0–0.1)
Basophils Relative: 1 %
Eosinophils Absolute: 0.1 10*3/uL (ref 0.0–0.5)
Eosinophils Relative: 4 %
HCT: 27.7 % — ABNORMAL LOW (ref 36.0–46.0)
Hemoglobin: 8.8 g/dL — ABNORMAL LOW (ref 12.0–15.0)
Immature Granulocytes: 1 %
Lymphocytes Relative: 27 %
Lymphs Abs: 0.8 10*3/uL (ref 0.7–4.0)
MCH: 31.5 pg (ref 26.0–34.0)
MCHC: 31.8 g/dL (ref 30.0–36.0)
MCV: 99.3 fL (ref 80.0–100.0)
Monocytes Absolute: 0.3 10*3/uL (ref 0.1–1.0)
Monocytes Relative: 10 %
Neutro Abs: 1.7 10*3/uL (ref 1.7–7.7)
Neutrophils Relative %: 57 %
Platelets: 124 10*3/uL — ABNORMAL LOW (ref 150–400)
RBC: 2.79 MIL/uL — ABNORMAL LOW (ref 3.87–5.11)
RDW: 14.4 % (ref 11.5–15.5)
WBC: 3 10*3/uL — ABNORMAL LOW (ref 4.0–10.5)
nRBC: 0 % (ref 0.0–0.2)

## 2022-12-29 LAB — BASIC METABOLIC PANEL
Anion gap: 5 (ref 5–15)
BUN: 18 mg/dL (ref 8–23)
CO2: 32 mmol/L (ref 22–32)
Calcium: 8.4 mg/dL — ABNORMAL LOW (ref 8.9–10.3)
Chloride: 106 mmol/L (ref 98–111)
Creatinine, Ser: 0.77 mg/dL (ref 0.44–1.00)
GFR, Estimated: >60 mL/min (ref >60)
Glucose, Bld: 77 mg/dL (ref 70–99)
Potassium: 3.9 mmol/L (ref 3.5–5.1)
Sodium: 143 mmol/L (ref 135–145)

## 2022-12-29 LAB — CK: Total CK: 55 U/L (ref 38–234)

## 2022-12-29 MED ORDER — MORPHINE SULFATE (PF) 4 MG/ML IV SOLN
4.0000 mg | Freq: Once | INTRAVENOUS | Status: AC
  Administered 2022-12-29: 4 mg via INTRAVENOUS
  Filled 2022-12-29: qty 1

## 2022-12-29 MED ORDER — MORPHINE SULFATE (PF) 4 MG/ML IV SOLN
4.0000 mg | INTRAVENOUS | Status: DC | PRN
  Administered 2022-12-29: 4 mg via INTRAVENOUS
  Filled 2022-12-29: qty 1

## 2022-12-29 NOTE — ED Notes (Signed)
Knee dressed with sterile non stick dressing prior to knee immobilizer placement. Pt cannot tolerate metal velcro stays at lateral and medial sides of immobilizer. Cms intact to toes before and after.

## 2022-12-29 NOTE — Consult Note (Signed)
  History and Physical    Meghan [name] ZOX:[id number] DOB: Apr 11, 1946 DOA: 12/29/2022  PCP: Eloisa Northern, MD  Patient coming from: home  I have personally briefly reviewed patient's old medical records in [facility]  Chief Complaint:  fall with  knee pain   HPI: Meghan [name] is a 77 y.o. female with medical history significant of complete heart block, hypertension, COPD/Asthma, CAD, GERD, type 2 DM, heart murmur, internal hemorrhoids, obesity, osteoarthritis, and osteoporosis admit to ED s/p fall with evaluation noting periprosthetic fracture. Per ortho patient to be transferred to high level of care.  Patient at time of hospital request 5 hour in ED with current attempts by ED to transfer patient ongoing. Patient per noted on waiting list at [facility], [facility] and [facility].  Currently as recommendations are made for transfer to higher level of care.  I discussed with  Ortho as well as ED. It was agreed upon that consult would be performed to assist with medical problems while awaiting transfer instead of admission. Of note prior to completing consult  patient was accepted and transferred  [facility].

## 2022-12-29 NOTE — ED Triage Notes (Addendum)
First Nurse Note;  Pt via EMS from [facility]. Pt here for a positive R knee fx. Pt states she fell 2 days ago and since been having R hip and knee pain. Denies head injury. Denies LOC. Pt is A&Ox4 and NAD 82 HR  109/57  99% on 2L [place] chronically

## 2022-12-29 NOTE — ED Provider Notes (Signed)
[facility] Provider Note    Event Date/Time   First MD Initiated Contact with Patient 12/29/22 1653     (approximate)   History   Fall   HPI  Meghan [name] is a 77 y.o. female COPD, CHF who presents today for evaluation of right knee pain.  Patient reports that 2 days ago she had a trip and fall directly onto her right knee.  Patient reports that she has been unable to ambulate since the fall.  She had an x-ray at her facility that showed a possible knee fracture so she was brought to the emergency department.  Patient denies paresthesias.  She denies head strike or LOC.  She reports that she has not been able to ambulate in 2 days.  Patient Active Problem List   Diagnosis Date Noted   Bruise 08/11/2022   Fever 03/15/2022   Fall 03/11/2022   Ankle fracture, right 03/11/2022   Pancytopenia (HCC) 03/11/2022   Acute on chronic diastolic CHF (congestive heart failure) (HCC)    AMS (altered mental status)    Vitamin D insufficiency 01/12/2022   Lumbar facet joint pain (Bilateral) 01/12/2022   Lumbosacral facet syndrome 01/12/2022   Lumbar degenerative disc disease 11/12/2021   Chronic pain syndrome 11/12/2021   Pharmacologic therapy 11/12/2021   Disorder of skeletal system 11/12/2021   Problems influencing health status 11/12/2021   Chronic hip pain (2ry area of Pain) (Bilateral) (R>L) 11/12/2021   Rheumatoid arthritis involving multiple sites with positive rheumatoid factor (HCC) 11/12/2021   Chronic use of opiate for therapeutic purpose 11/12/2021   Chronic low back pain (1ry area of Pain) (Bilateral) (R>L) w/o sciatica 03/20/2020   Lumbar stenosis with neurogenic claudication 03/20/2020   Dyspnea 11/08/2018   Complete heart block (HCC) 10/21/2018   Closed fracture of distal end of right femur (HCC), periprosthetic 10/21/2018   Palliative care encounter 09/01/2018   Weakness generalized 09/01/2018   Shortness of breath 09/01/2018   Syncope  05/05/2017   Acute on chronic systolic CHF (congestive heart failure) (HCC) 04/16/2017   Acute lower UTI 04/16/2017   Acute respiratory failure (HCC) 04/16/2017   Pressure injury of skin 03/22/2017   Acute on chronic respiratory failure with hypoxia (HCC) 03/20/2017   Acute respiratory failure with hypoxia (HCC) 03/20/2017   Near syncope 02/27/2017   Dehydration 02/27/2017   Colitis 02/22/2017   Seizure (HCC) 01/03/2016   Chronic tension-type headache, intractable 12/04/2015   Olfactory hallucination 12/04/2015   Degeneration of intervertebral disc of lumbar region 07/15/2015   Seropositive rheumatoid arthritis (HCC) 07/15/2015   Asthma with acute exacerbation 03/10/2015   Hypokalemia 03/01/2015   Hyponatremia 03/01/2015   DDD (degenerative disc disease), lumbar 01/31/2015   Arthritis, degenerative 01/31/2015   HTN (hypertension) 12/24/2014   Sepsis (HCC) 12/24/2014   Left knee pain 12/24/2014   GERD (gastroesophageal reflux disease) 12/24/2014   COPD (chronic obstructive pulmonary disease) (HCC) 12/24/2014   Depression 12/24/2014   Anxiety 12/24/2014   Severe bipolar disorder with psychotic features, mood-congruent (HCC) 11/16/2014   Neurosis, posttraumatic 11/16/2014   H/O gastric ulcer 11/16/2014   Barton's fracture of distal radius, closed 09/19/2014   Neuritis or radiculitis due to rupture of lumbar intervertebral disc 05/11/2014   Cervico-occipital neuralgia 03/26/2014   Difficulty in walking 03/26/2014   Difficulty in walking, not elsewhere classified 03/26/2014   Cervical spine syndrome 01/26/2014   Cephalalgia 01/09/2014   Disordered sleep 01/09/2014   BP (high blood pressure) 10/19/2013   Adiposity 10/19/2013  Cardiac murmur 10/19/2013   Breath shortness 10/19/2013   Chronic obstructive pulmonary disease (HCC) 10/19/2013   Diabetes mellitus (HCC) 10/19/2013          Physical Exam   Triage Vital Signs: ED Triage Vitals  Enc Vitals Group     BP 12/29/22  1623 105/64     Pulse Rate 12/29/22 1623 73     Resp 12/29/22 1623 18     Temp 12/29/22 1623 98.5 F (36.9 C)     Temp Source 12/29/22 1623 Oral     SpO2 12/29/22 1623 97 %     Weight 12/29/22 1637 122 lb (55.3 kg)     Height 12/29/22 1637 4' 3.5" (1.308 m)     Head Circumference --      Peak Flow --      Pain Score 12/29/22 1636 10     Pain Loc --      Pain Edu? --      Excl. in GC? --     Most recent vital signs: Vitals:   12/29/22 1623  BP: 105/64  Pulse: 73  Resp: 18  Temp: 98.5 F (36.9 C)  SpO2: 97%    Physical Exam Vitals and nursing note reviewed.  Constitutional:      General: Awake and alert. No acute distress.    Appearance: Normal appearance. The patient is normal weight.  HENT:     Head: Normocephalic.  Left infraorbital ecchymosis.  No proptosis.  Normal and full EOMs. No periorbital tenderness. No midface tenderness. No trismus. No mandibular tenderness.    Mouth: Mucous membranes are moist.  Eyes:     General: PERRL. Normal EOMs        Right eye: No discharge.        Left eye: No discharge.     Conjunctiva/sclera: Conjunctivae normal.  Cardiovascular:     Rate and Rhythm: Normal rate and regular rhythm.     Pulses: Normal pulses.  Pulmonary:     Effort: Pulmonary effort is normal. No respiratory distress.     Breath sounds: Normal breath sounds. No chest wall tenderness or ecchymosis. Abdominal:     Abdomen is soft. There is no abdominal tenderness. No rebound or guarding. No distention. Musculoskeletal:        General: No swelling. Normal range of motion.     Cervical back: Normal range of motion and neck supple.  Right knee: Diffuse ecchymosis with superficial blisters over the dorsum of the knee with tenderness to palpation.  No open skin.  Pelvis is stable.  Foot is warm and well-perfused, normal capillary refill.  Sensation intact to light touch throughout.  Compartment soft and compressible throughout. Skin:    General: Skin is warm and dry.      Capillary Refill: Capillary refill takes less than 2 seconds.     Findings: No rash.  Neurological:     Mental Status: The patient is awake and alert. GCS 15.     ED Results / Procedures / Treatments   Labs (all labs ordered are listed, but only abnormal results are displayed) Labs Reviewed  CBC WITH DIFFERENTIAL/PLATELET - Abnormal; Notable for the following components:      Result Value   WBC 3.0 (*)    RBC 2.79 (*)    Hemoglobin 8.8 (*)    HCT 27.7 (*)    Platelets 124 (*)    All other components within normal limits  BASIC METABOLIC PANEL - Abnormal; Notable for the following components:  Calcium 8.4 (*)    All other components within normal limits  CK     EKG     RADIOLOGY I independently reviewed and interpreted imaging and agree with radiologists findings.     PROCEDURES:  Critical Care performed:   Procedures   MEDICATIONS ORDERED IN ED: Medications - No data to display   IMPRESSION / MDM / ASSESSMENT AND PLAN / ED COURSE  I reviewed the triage vital signs and the nursing notes.   Differential diagnosis includes, but is not limited to, fracture, dislocation, contusion, intracranial hemorrhage, cervical spine injury.  Patient is awake and alert, hemodynamically stable and afebrile.  She has obvious trauma to her right knee, though her foot is warm and well-perfused.  X-ray of her knee reveals a comminuted and displaced fracture in her right proximal tibia and fibula with possible loosening of the prosthesis in her proximal tibia.  I initially discussed this with Dr. Martha Clan with orthopedics who felt that this would be better suited for [facility] or [facility].  I discussed with Noel Gerold initially who felt that this would be more appropriate for [facility] or [facility].  I subsequently discussed with [facility] who reports that they are at capacity and declined the patient.  While awaiting acceptance of transfer, preoperative labs were obtained.  CT head and neck also  obtained given mechanism of injury, her obvious facial trauma, and her age per [place] criteria.  These were negative for any acute findings.  Labs are similar to baseline.  CPK obtained given that patient was laying in her bed for 2 days, this was within normal limits.  Patient declined analgesia.  I also discussed with Verlon Au, RN on-call at her assisted living facility who reports that patient lives with only minimal assistance twice weekly.  I had a lengthy discussion with patient and her niece Eunice Blase, who would like to proceed with operative repair.  Awaiting return calls from [place] and [place].  Patient was passed off to Dr. Arnoldo Morale pending disposition and transfer acceptance.   Patient's presentation is most consistent with acute presentation with potential threat to life or bodily function.   Clinical Course as of 12/29/22 1934  Tue Dec 29, 2022  1732 Discussed with Dr. Martha Clan with orthopedic surgery who recommends transfer given her previous prosthesis [JP]  1810 Discussed with Noel Gerold who felt that this would be better suited for [facility] or [facility].  [facility] paged [JP]  1834 [facility] declined, they are at capacity.  Will try [facility] instead [JP]    Clinical Course User Index [JP] Todd Jelinski, Herb Grays, PA-C     FINAL CLINICAL IMPRESSION(S) / ED DIAGNOSES   Final diagnoses:  Periprosthetic fracture around internal prosthetic right knee joint, initial encounter  Closed fracture of proximal end of right tibia and fibula, initial encounter  Fall, initial encounter     Rx / DC Orders   ED Discharge Orders     None        Note:  This document was prepared using Dragon voice recognition software and may include unintentional dictation errors.   Keturah Shavers 12/29/22 1934    Corena Herter, MD 12/29/22 2352

## 2022-12-29 NOTE — ED Provider Notes (Addendum)
Shared visit  Fall with complex fracture, periprosthetic fracture.  Consulted orthopedic surgery and recommended transfer to tertiary care center given the complexity of the fracture.  No open fracture.  Clinical Course as of 12/29/22 2232  Tue Dec 29, 2022  1732 Discussed with Dr. Martha Clan with orthopedic surgery who recommends transfer given her previous prosthesis [JP]  1810 Discussed with Noel Gerold who felt that this would be better suited for [facility] or [facility].  [facility] paged [JP]  1834 [facility] declined, they are at capacity.  Will try [facility] instead [JP]  1947 Currently waiting for [facility] and [facility] to call back for an accepting doctor. [SM]  2132 Patient is currently on the wait list for [facility].  [facility] surgery called back Dr. Sheran Spine, felt that this patient could be managed at our hospital and wanted to talk to orthopedics.  Will reach out to our orthopedic surgery team to reach out to [facility] to determine the best place for treatment of the patient.  Patient was given IV morphine and placed in knee immobilizer. [SM]  2228 Dr. Martha Clan - called back, recommended admission to the hospitalist with continued plan to transfer to tertiary care center when available.  [facility] called back and accepted to waitlist.  Called and discussed with the hospitalist who did not want accept the patient for admission and felt that the patient needed to continue to board in the emergency department until a bed became available.  Will consult for medical management.  Placed on boarder status. [SM]    Clinical Course User Index [JP] Poggi, Herb Grays, PA-C [SM] Corena Herter, MD      Corena Herter, MD 12/29/22 Aldona Lento    Corena Herter, MD 12/29/22 2232

## 2022-12-30 NOTE — ED Notes (Signed)
EMTALA REVIEWED BY WRITER. ALL REQUIRED DOCUMENTATION COMPLETED, INCLUDING: TRANSFER SIGNATURE, REASSESSMENT, VS, MEDICAL NECESSITY AND COMPLETED EMTALA DOCUMENTATION.

## 2022-12-30 NOTE — ED Provider Notes (Signed)
Patient has been accepted to [facility], Teddy Spike, MD 12/30/22 [phone]

## 2023-01-18 ENCOUNTER — Ambulatory Visit: Payer: Medicare Other

## 2023-02-04 IMAGING — CR DG LUMBAR SPINE 2-3V
3 series · 3 of 3 positions shown · non-contrast
Comparison: 10/16/2020

CLINICAL DATA: Fall, back pain

EXAM:
LUMBAR SPINE - 2-3 VIEW

[l-spine ap]
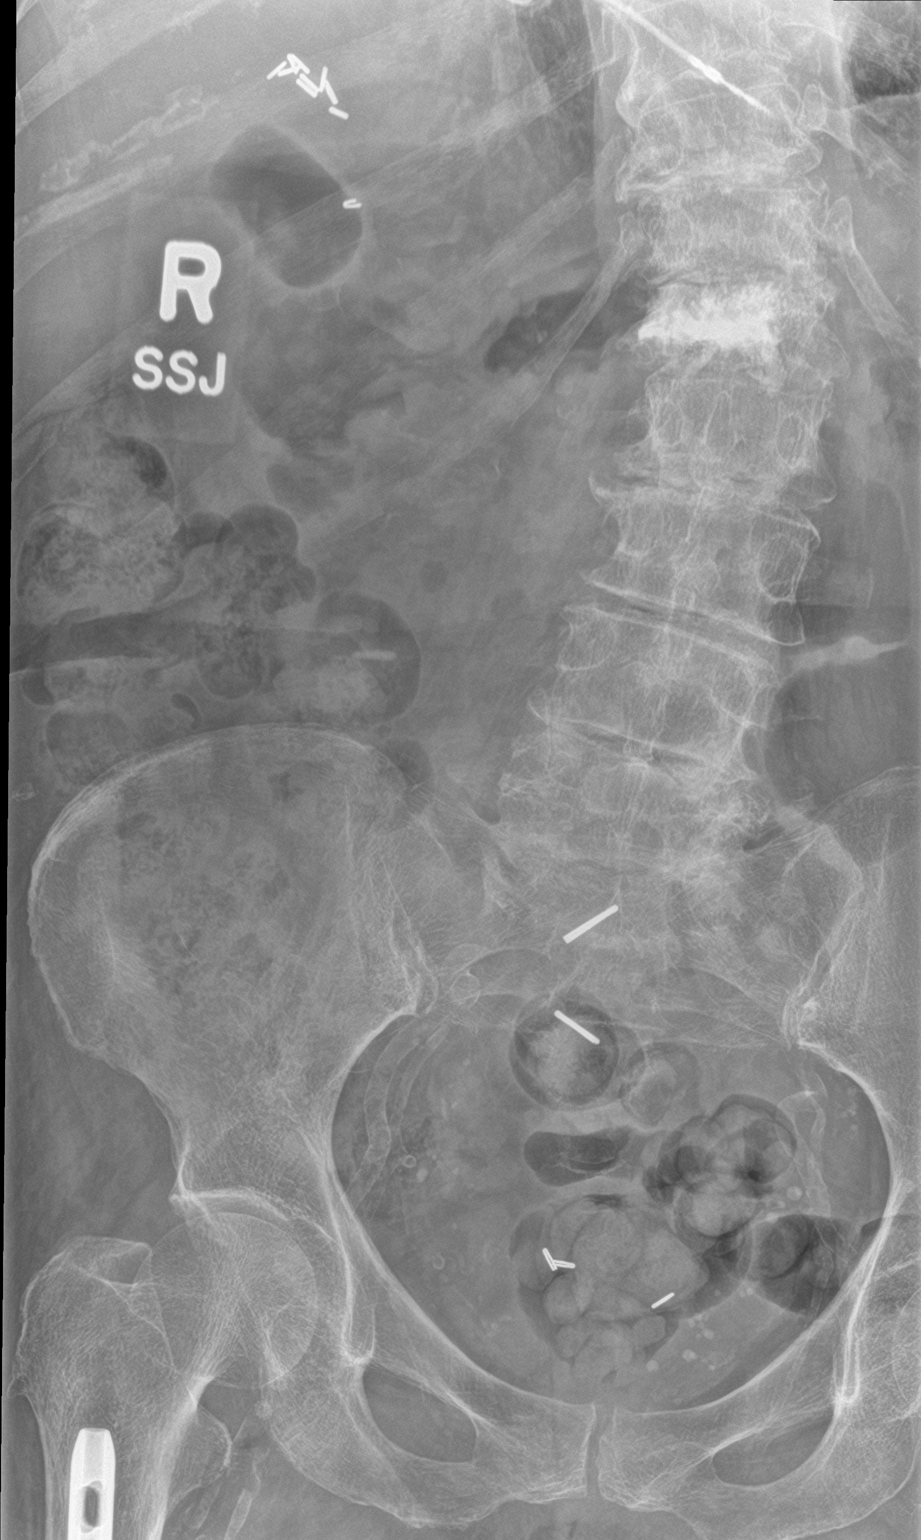

[l-spine lat]
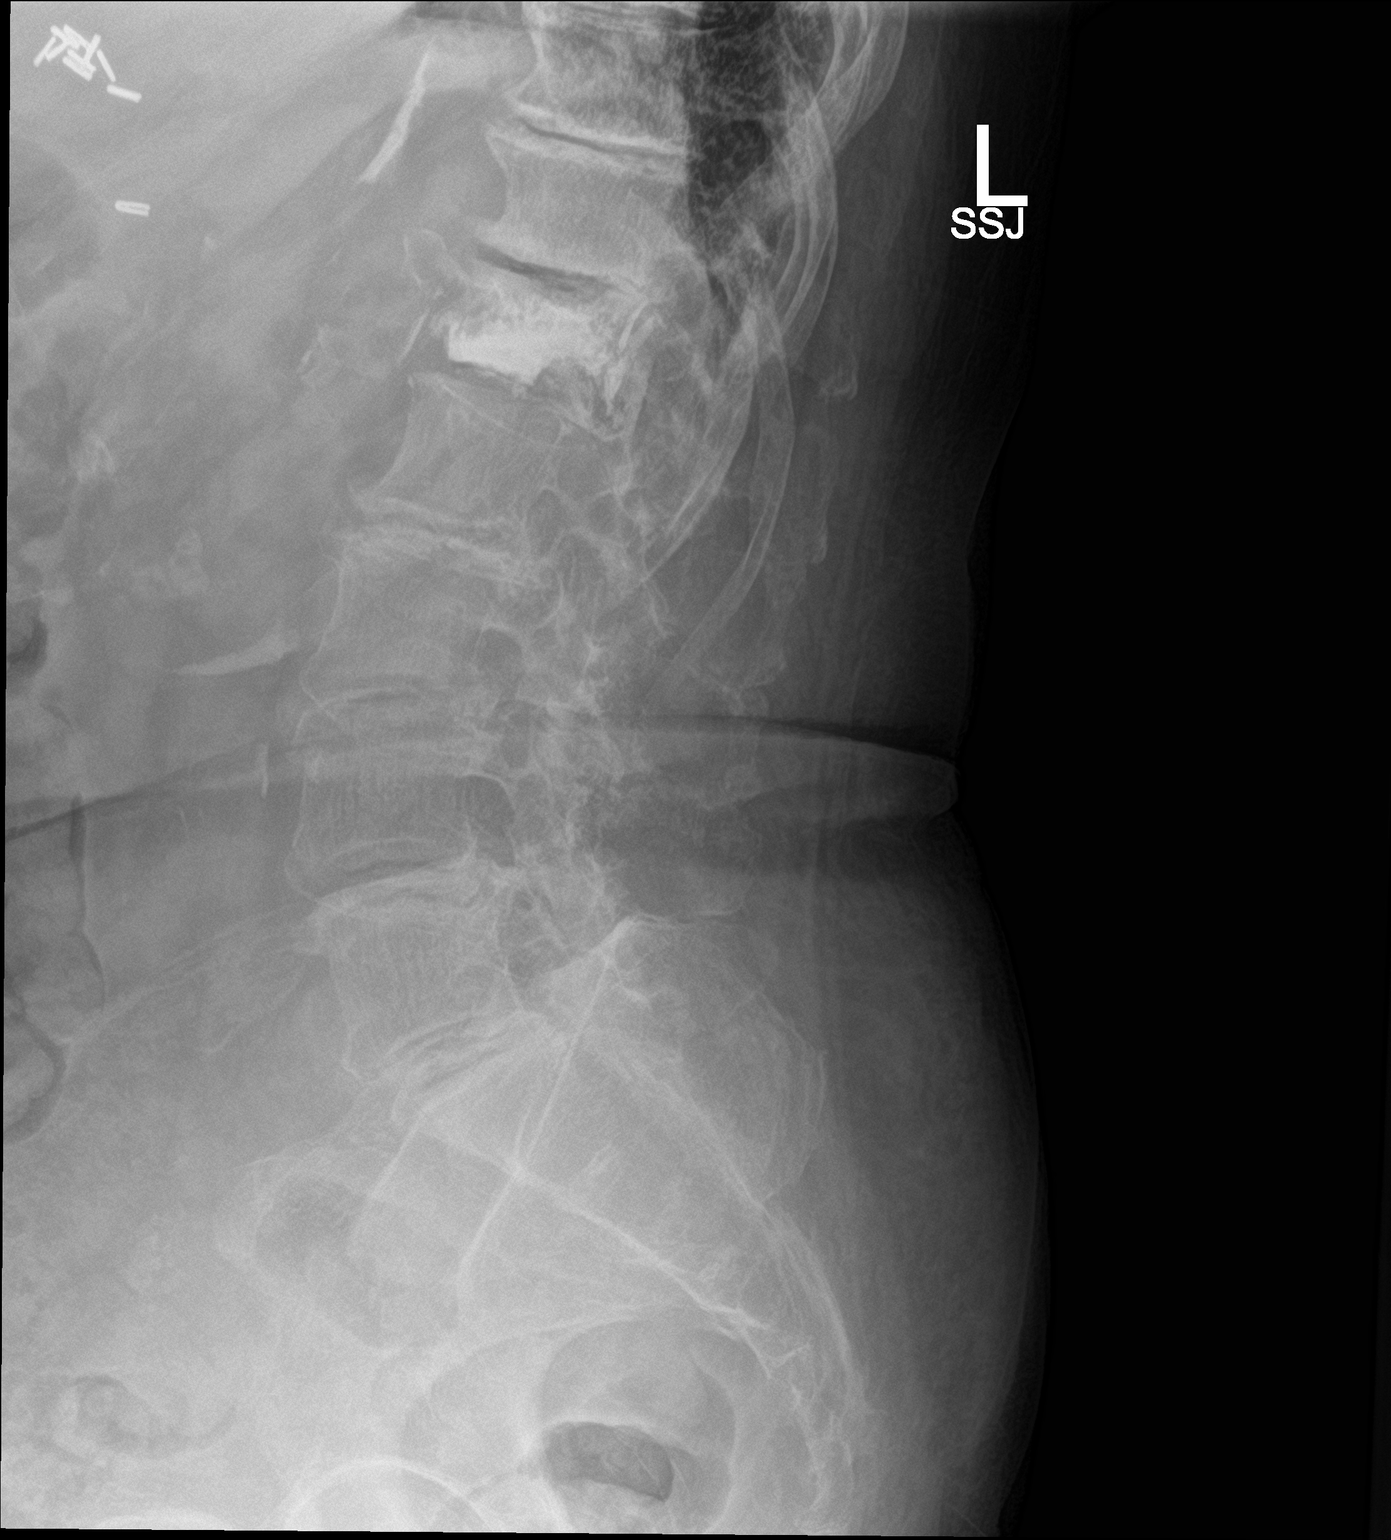

[l-spine spot]
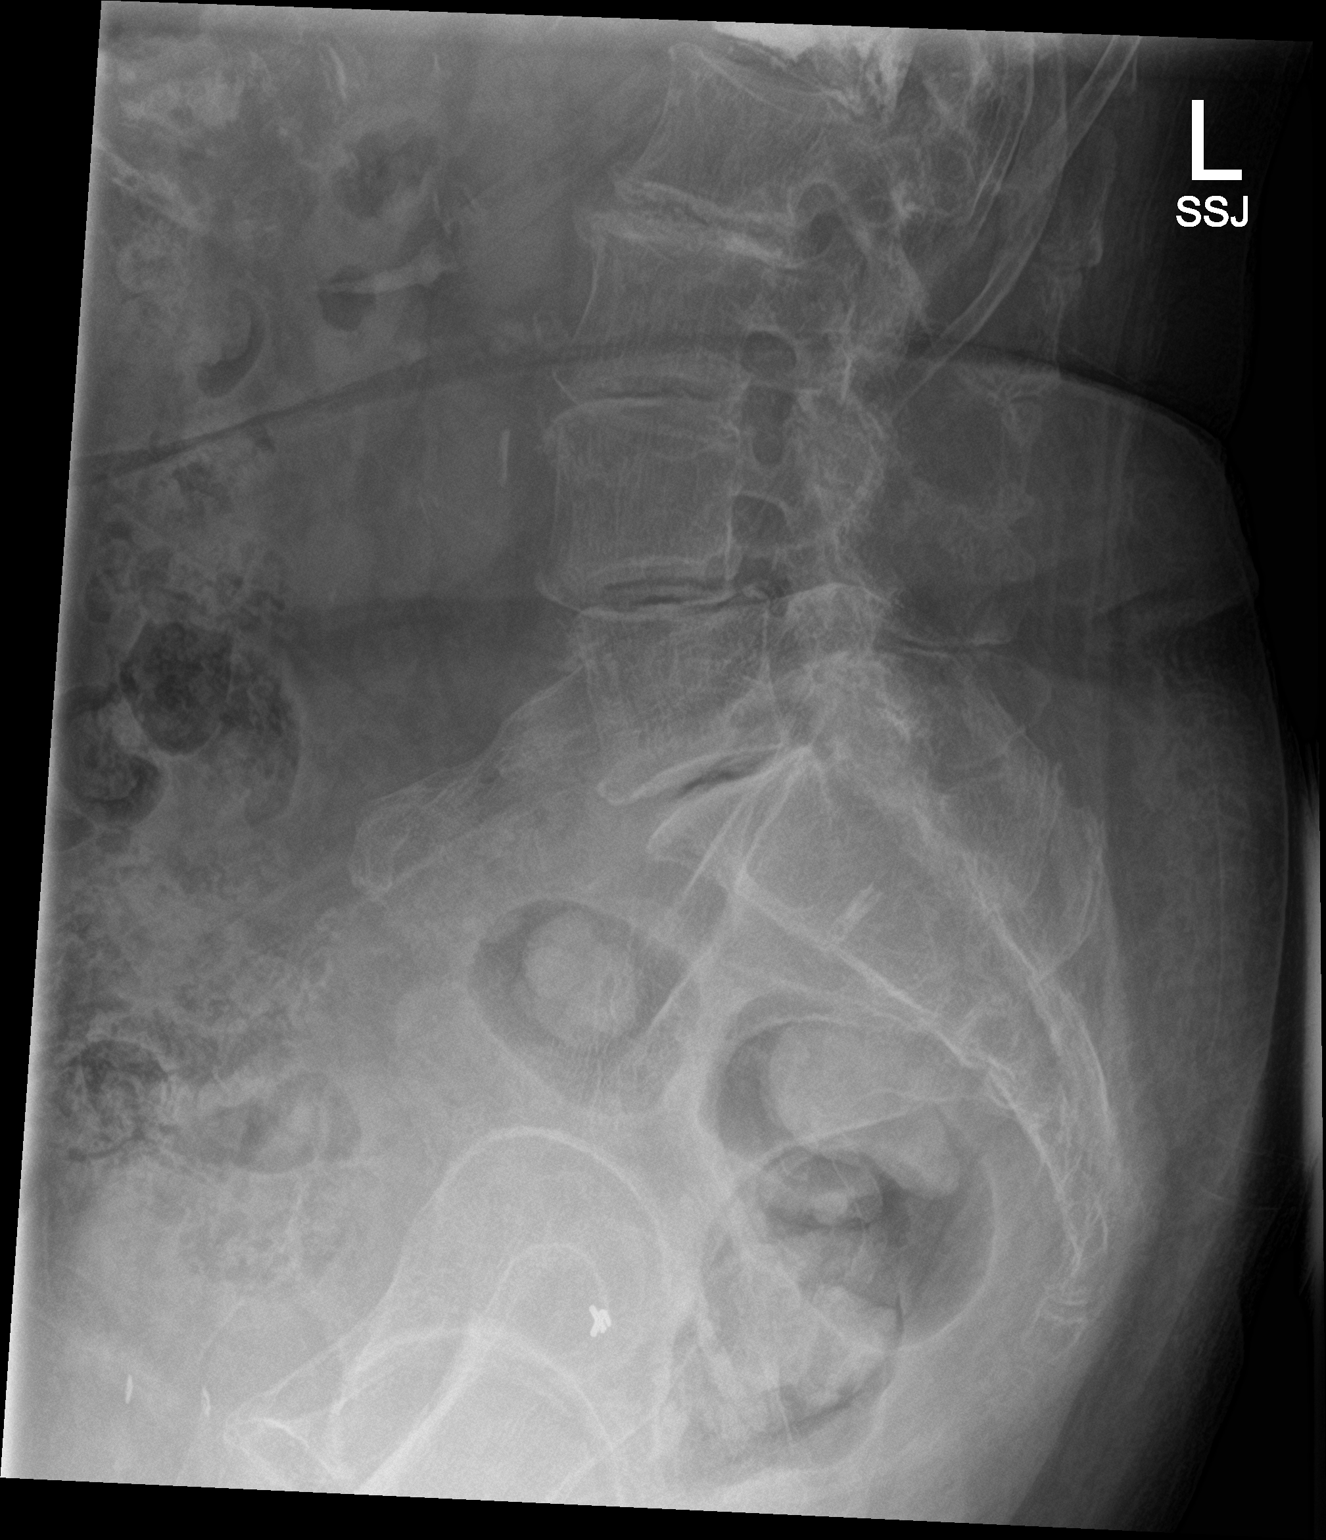

[3 of 3 positions shown; findings below may reference images not displayed]

FINDINGS: Five lumbar type vertebral segments. Chronic L1 vertebral body
compression fracture status post cement augmentation. Remaining
vertebral body heights are maintained without evidence of fracture.
Unchanged alignment. Multilevel degenerative changes of the discs
and facet joints. Diffuse bony demineralization.
IMPRESSION: Chronic L1 vertebral body compression fracture status post cement
augmentation. No new or acute fracture identified.

## 2023-02-04 IMAGING — CT CT HEAD W/O CM
3 series · 16 of 47 positions shown, 19 images · non-contrast
Comparison: 10/16/2020

CLINICAL DATA: Fall, head trauma

EXAM:
CT HEAD WITHOUT CONTRAST
TECHNIQUE: Contiguous axial images were obtained from the base of the skull
through the vertex without intravenous contrast.

[Series 2: head wo · axial · 0.43mm/px · z∈[-199,-64]mm · 10 of 33 slices shown, 13 images]
[im 3/33  brain]
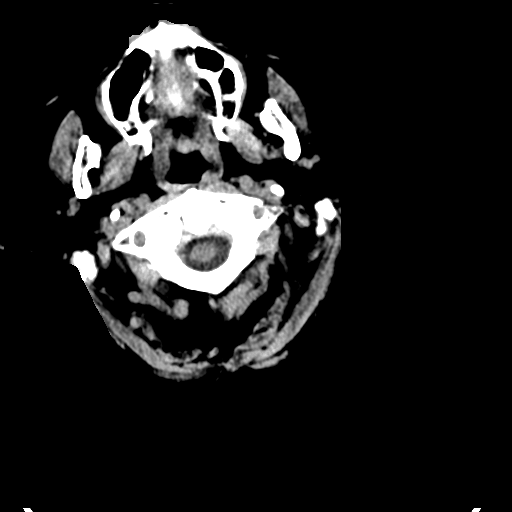
[im 3/33  bone]
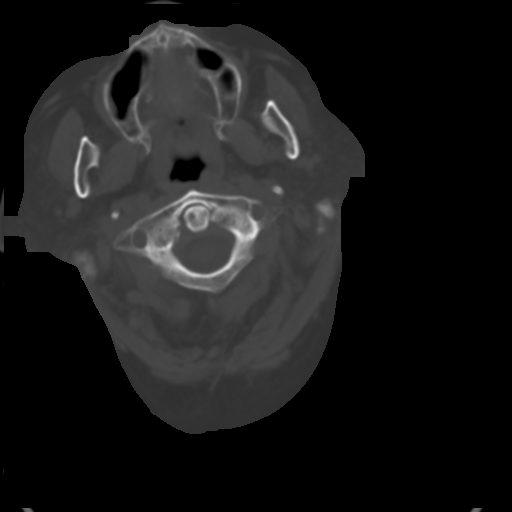
[im 6/33  brain]
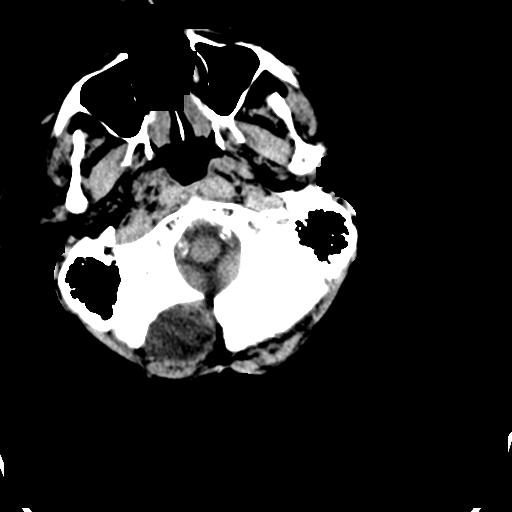
[im 9/33  brain]
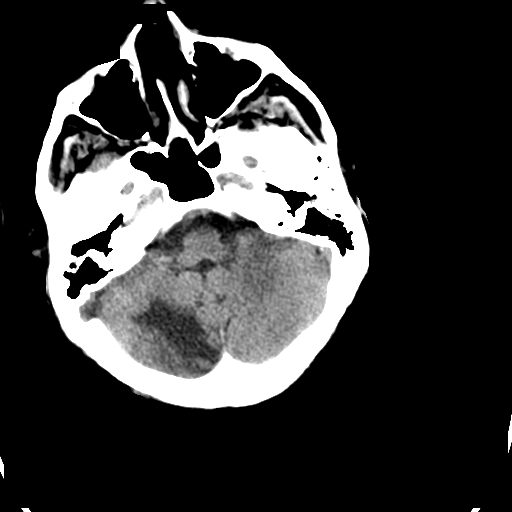
[im 12/33  brain]
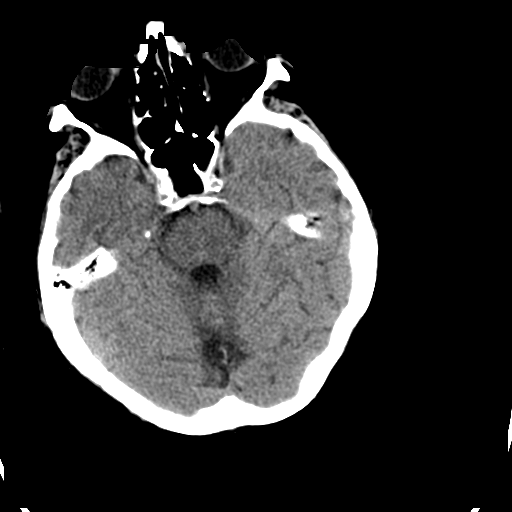
[im 15/33  brain]
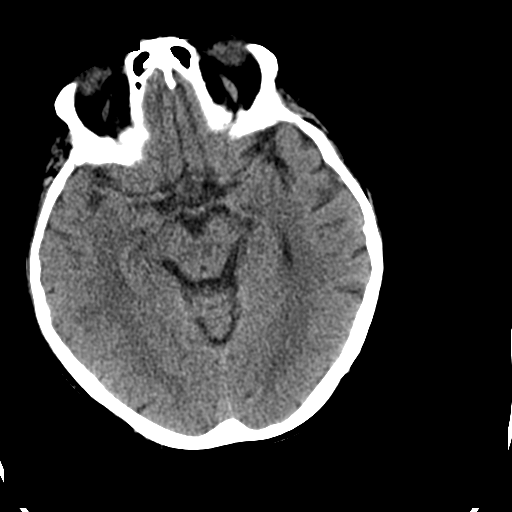
[im 15/33  bone]
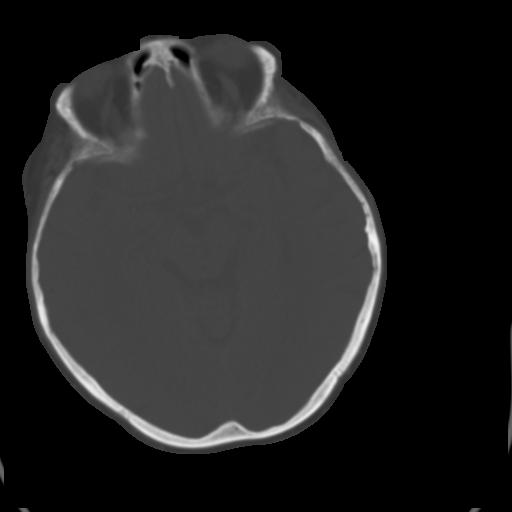
[im 18/33  brain]
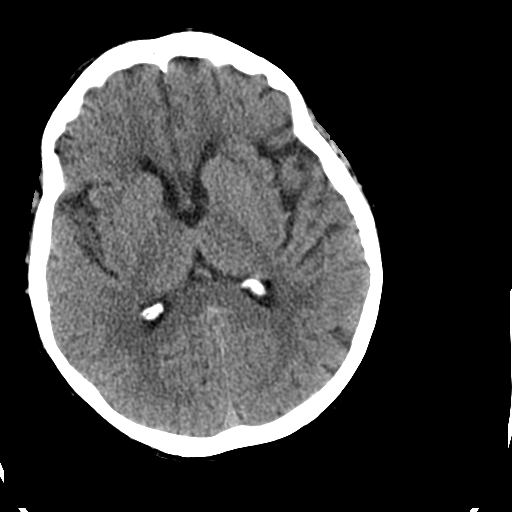
[im 21/33  brain]
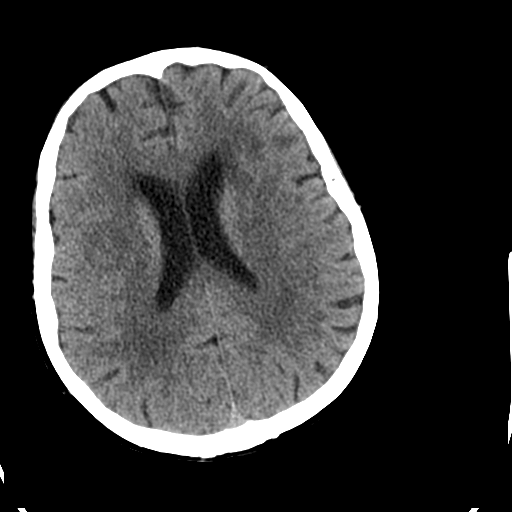
[im 25/33  brain]
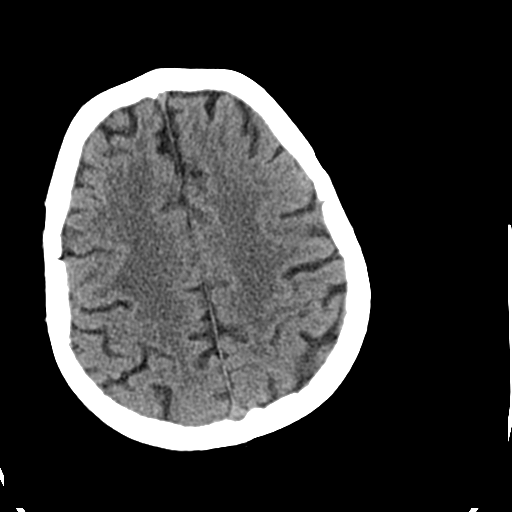
[im 27/33  brain]
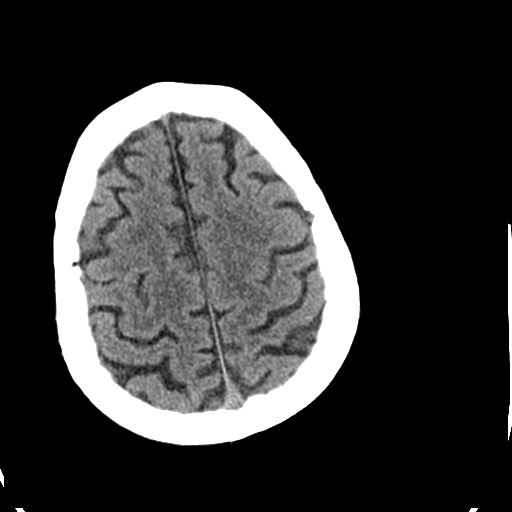
[im 27/33  bone]
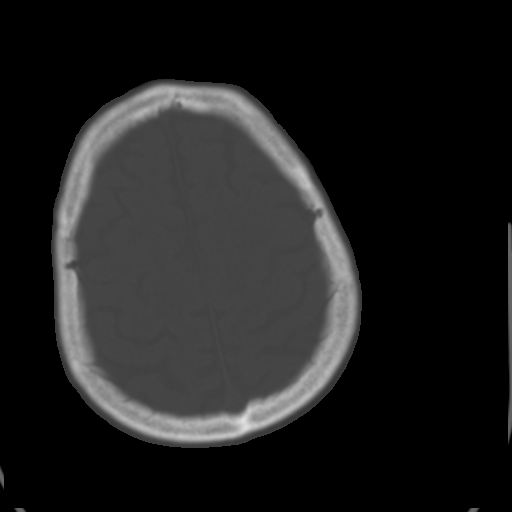
[im 30/33  brain]
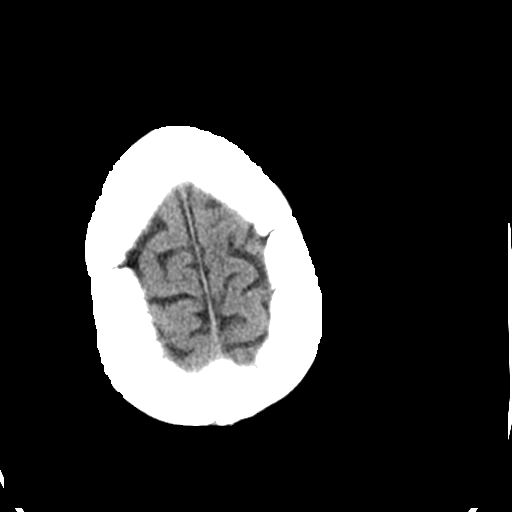

[Series 4: coronal soft tissue · coronal · 0.33mm/px · 3 of 67 slices shown]
[im 23/67  brain]
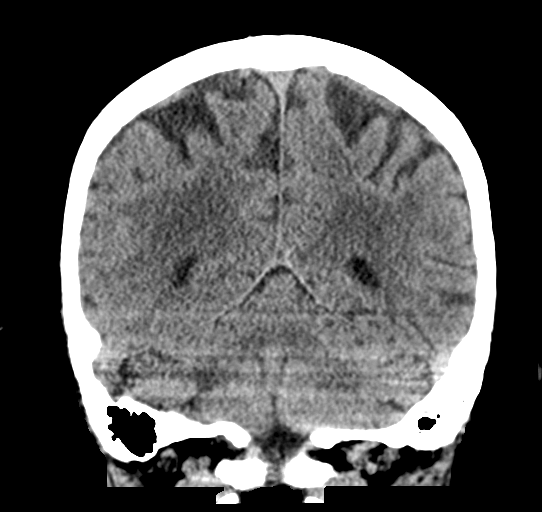
[im 30/67  brain]
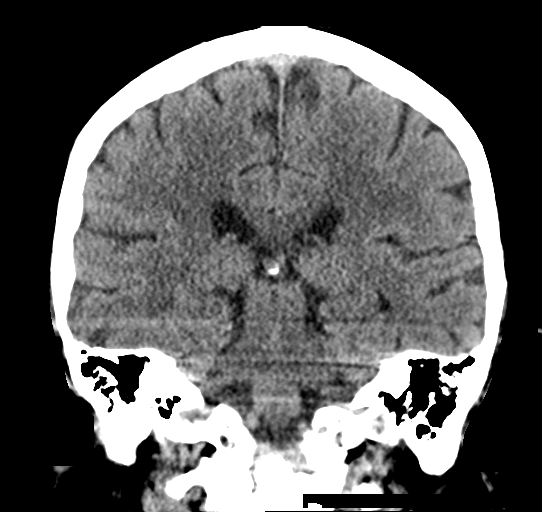
[im 37/67  brain]
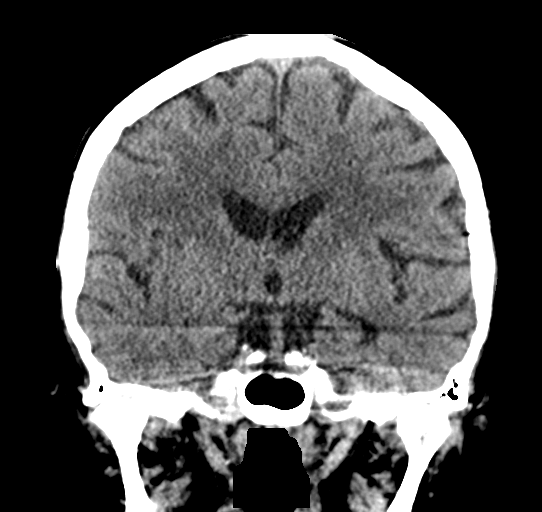

[Series 5: sagittal soft tissue · sagittal · 0.33mm/px · 3 of 61 slices shown]
[im 21/61  brain]
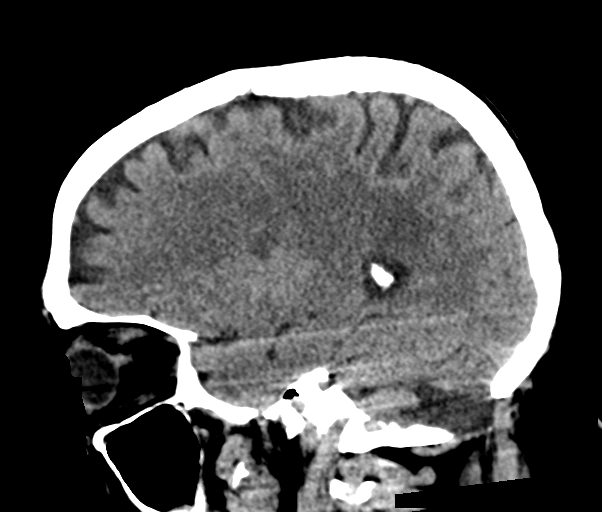
[im 31/61  brain]
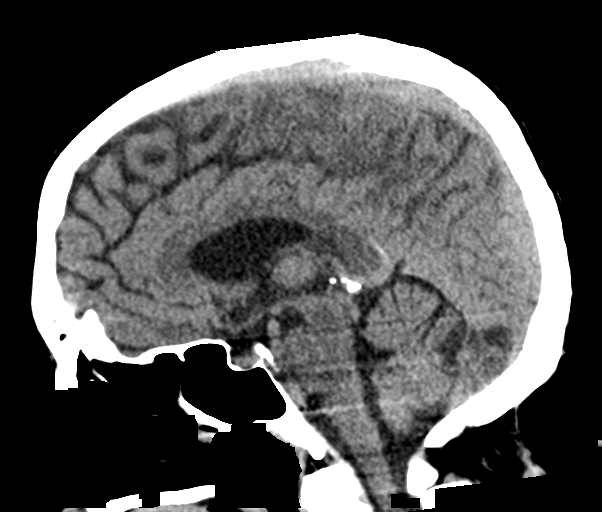
[im 41/61  brain]
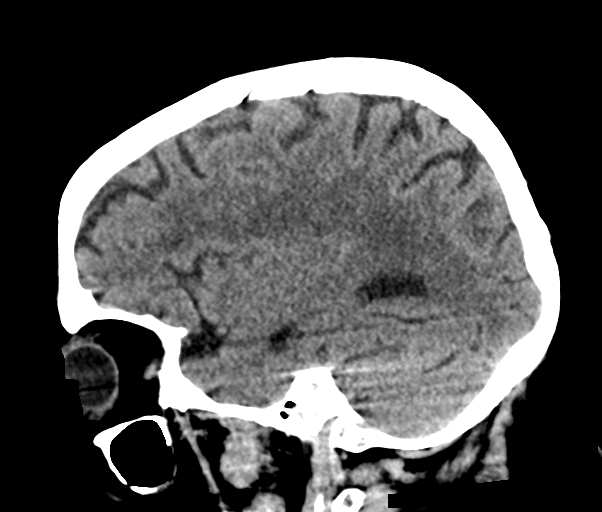

[16 of 47 positions shown; findings below may reference images not displayed]

FINDINGS: Brain: No acute intracranial abnormality. Specifically, no
hemorrhage, hydrocephalus, mass lesion, acute infarction, or
significant intracranial injury.

Vascular: No hyperdense vessel or unexpected calcification.

Skull: No acute calvarial abnormality.

Sinuses/Orbits: No acute findings

Other: None
IMPRESSION: No acute intracranial abnormality.

## 2023-02-25 ENCOUNTER — Encounter: Payer: Self-pay | Admitting: Emergency Medicine

## 2023-02-25 ENCOUNTER — Emergency Department: Payer: Medicare Other

## 2023-02-25 ENCOUNTER — Inpatient Hospital Stay
Admission: EM | Admit: 2023-02-25 | Discharge: 2023-03-11 | DRG: 951 | Disposition: E | Payer: Medicare Other | Attending: Obstetrics and Gynecology | Admitting: Obstetrics and Gynecology

## 2023-02-25 ENCOUNTER — Other Ambulatory Visit: Payer: Self-pay

## 2023-02-25 DIAGNOSIS — I2510 Atherosclerotic heart disease of native coronary artery without angina pectoris: Secondary | ICD-10-CM | POA: Diagnosis present

## 2023-02-25 DIAGNOSIS — Z9049 Acquired absence of other specified parts of digestive tract: Secondary | ICD-10-CM

## 2023-02-25 DIAGNOSIS — N179 Acute kidney failure, unspecified: Secondary | ICD-10-CM | POA: Diagnosis present

## 2023-02-25 DIAGNOSIS — M069 Rheumatoid arthritis, unspecified: Secondary | ICD-10-CM | POA: Diagnosis present

## 2023-02-25 DIAGNOSIS — Z950 Presence of cardiac pacemaker: Secondary | ICD-10-CM

## 2023-02-25 DIAGNOSIS — R4182 Altered mental status, unspecified: Principal | ICD-10-CM | POA: Diagnosis present

## 2023-02-25 DIAGNOSIS — Z96698 Presence of other orthopedic joint implants: Secondary | ICD-10-CM | POA: Diagnosis present

## 2023-02-25 DIAGNOSIS — Z66 Do not resuscitate: Secondary | ICD-10-CM | POA: Diagnosis present

## 2023-02-25 DIAGNOSIS — I10 Essential (primary) hypertension: Secondary | ICD-10-CM | POA: Diagnosis present

## 2023-02-25 DIAGNOSIS — E119 Type 2 diabetes mellitus without complications: Secondary | ICD-10-CM | POA: Diagnosis present

## 2023-02-25 DIAGNOSIS — Z79899 Other long term (current) drug therapy: Secondary | ICD-10-CM

## 2023-02-25 DIAGNOSIS — Z7951 Long term (current) use of inhaled steroids: Secondary | ICD-10-CM

## 2023-02-25 DIAGNOSIS — Z881 Allergy status to other antibiotic agents status: Secondary | ICD-10-CM

## 2023-02-25 DIAGNOSIS — Z515 Encounter for palliative care: Principal | ICD-10-CM

## 2023-02-25 DIAGNOSIS — E7800 Pure hypercholesterolemia, unspecified: Secondary | ICD-10-CM | POA: Diagnosis present

## 2023-02-25 DIAGNOSIS — R6521 Severe sepsis with septic shock: Secondary | ICD-10-CM | POA: Diagnosis present

## 2023-02-25 DIAGNOSIS — Z96653 Presence of artificial knee joint, bilateral: Secondary | ICD-10-CM | POA: Diagnosis present

## 2023-02-25 DIAGNOSIS — E875 Hyperkalemia: Secondary | ICD-10-CM | POA: Diagnosis present

## 2023-02-25 DIAGNOSIS — J9601 Acute respiratory failure with hypoxia: Secondary | ICD-10-CM | POA: Diagnosis present

## 2023-02-25 DIAGNOSIS — M1990 Unspecified osteoarthritis, unspecified site: Secondary | ICD-10-CM | POA: Diagnosis present

## 2023-02-25 DIAGNOSIS — R54 Age-related physical debility: Secondary | ICD-10-CM | POA: Diagnosis present

## 2023-02-25 DIAGNOSIS — J4489 Other specified chronic obstructive pulmonary disease: Secondary | ICD-10-CM | POA: Diagnosis present

## 2023-02-25 DIAGNOSIS — F419 Anxiety disorder, unspecified: Secondary | ICD-10-CM | POA: Diagnosis present

## 2023-02-25 DIAGNOSIS — Z8249 Family history of ischemic heart disease and other diseases of the circulatory system: Secondary | ICD-10-CM

## 2023-02-25 DIAGNOSIS — Z86011 Personal history of benign neoplasm of the brain: Secondary | ICD-10-CM

## 2023-02-25 DIAGNOSIS — R7989 Other specified abnormal findings of blood chemistry: Secondary | ICD-10-CM

## 2023-02-25 DIAGNOSIS — A419 Sepsis, unspecified organism: Secondary | ICD-10-CM | POA: Diagnosis present

## 2023-02-25 DIAGNOSIS — Z90710 Acquired absence of both cervix and uterus: Secondary | ICD-10-CM

## 2023-02-25 DIAGNOSIS — Z818 Family history of other mental and behavioral disorders: Secondary | ICD-10-CM

## 2023-02-25 DIAGNOSIS — Z833 Family history of diabetes mellitus: Secondary | ICD-10-CM

## 2023-02-25 DIAGNOSIS — Z981 Arthrodesis status: Secondary | ICD-10-CM

## 2023-02-25 DIAGNOSIS — E872 Acidosis, unspecified: Secondary | ICD-10-CM | POA: Diagnosis present

## 2023-02-25 DIAGNOSIS — E669 Obesity, unspecified: Secondary | ICD-10-CM | POA: Diagnosis present

## 2023-02-25 DIAGNOSIS — R401 Stupor: Secondary | ICD-10-CM

## 2023-02-25 DIAGNOSIS — F32A Depression, unspecified: Secondary | ICD-10-CM | POA: Diagnosis present

## 2023-02-25 DIAGNOSIS — K219 Gastro-esophageal reflux disease without esophagitis: Secondary | ICD-10-CM | POA: Diagnosis present

## 2023-02-25 DIAGNOSIS — I442 Atrioventricular block, complete: Secondary | ICD-10-CM | POA: Diagnosis present

## 2023-02-25 DIAGNOSIS — M810 Age-related osteoporosis without current pathological fracture: Secondary | ICD-10-CM | POA: Diagnosis present

## 2023-02-25 DIAGNOSIS — I998 Other disorder of circulatory system: Secondary | ICD-10-CM | POA: Diagnosis present

## 2023-02-25 DIAGNOSIS — Z888 Allergy status to other drugs, medicaments and biological substances status: Secondary | ICD-10-CM

## 2023-02-25 DIAGNOSIS — Z1152 Encounter for screening for COVID-19: Secondary | ICD-10-CM

## 2023-02-25 DIAGNOSIS — G9349 Other encephalopathy: Secondary | ICD-10-CM | POA: Diagnosis present

## 2023-02-25 DIAGNOSIS — Z886 Allergy status to analgesic agent status: Secondary | ICD-10-CM

## 2023-02-25 DIAGNOSIS — N390 Urinary tract infection, site not specified: Secondary | ICD-10-CM | POA: Diagnosis present

## 2023-02-25 DIAGNOSIS — Z6822 Body mass index (BMI) 22.0-22.9, adult: Secondary | ICD-10-CM

## 2023-02-25 DIAGNOSIS — F4310 Post-traumatic stress disorder, unspecified: Secondary | ICD-10-CM | POA: Diagnosis present

## 2023-02-25 LAB — CBC WITH DIFFERENTIAL/PLATELET
Abs Immature Granulocytes: 1.04 10*3/uL — ABNORMAL HIGH (ref 0.00–0.07)
Basophils Absolute: 0 10*3/uL (ref 0.0–0.1)
Basophils Relative: 0 %
Eosinophils Absolute: 0 10*3/uL (ref 0.0–0.5)
Eosinophils Relative: 0 %
HCT: 45.8 % (ref 36.0–46.0)
Hemoglobin: 14.3 g/dL (ref 12.0–15.0)
Immature Granulocytes: 7 %
Lymphocytes Relative: 17 %
Lymphs Abs: 2.4 10*3/uL (ref 0.7–4.0)
MCH: 30 pg (ref 26.0–34.0)
MCHC: 31.2 g/dL (ref 30.0–36.0)
MCV: 96.2 fL (ref 80.0–100.0)
Monocytes Absolute: 0.9 10*3/uL (ref 0.1–1.0)
Monocytes Relative: 7 %
Neutro Abs: 9.7 10*3/uL — ABNORMAL HIGH (ref 1.7–7.7)
Neutrophils Relative %: 69 %
Platelets: 279 10*3/uL (ref 150–400)
RBC: 4.76 MIL/uL (ref 3.87–5.11)
RDW: 15.1 % (ref 11.5–15.5)
Smear Review: NORMAL
WBC: 14.1 10*3/uL — ABNORMAL HIGH (ref 4.0–10.5)
nRBC: 0.5 % — ABNORMAL HIGH (ref 0.0–0.2)

## 2023-02-25 LAB — COMPREHENSIVE METABOLIC PANEL
ALT: 28 U/L (ref 0–44)
AST: 43 U/L — ABNORMAL HIGH (ref 15–41)
Albumin: 3.1 g/dL — ABNORMAL LOW (ref 3.5–5.0)
Alkaline Phosphatase: 106 U/L (ref 38–126)
Anion gap: 14 (ref 5–15)
BUN: 88 mg/dL — ABNORMAL HIGH (ref 8–23)
CO2: 20 mmol/L — ABNORMAL LOW (ref 22–32)
Calcium: 10.9 mg/dL — ABNORMAL HIGH (ref 8.9–10.3)
Chloride: 105 mmol/L (ref 98–111)
Creatinine, Ser: 2.13 mg/dL — ABNORMAL HIGH (ref 0.44–1.00)
GFR, Estimated: 24 mL/min — ABNORMAL LOW (ref >60)
Glucose, Bld: 137 mg/dL — ABNORMAL HIGH (ref 70–99)
Potassium: 6.9 mmol/L (ref 3.5–5.1)
Sodium: 139 mmol/L (ref 135–145)
Total Bilirubin: 0.9 mg/dL (ref 0.3–1.2)
Total Protein: 8.3 g/dL — ABNORMAL HIGH (ref 6.5–8.1)

## 2023-02-25 LAB — BLOOD GAS, ARTERIAL
Acid-base deficit: 11.3 mmol/L — ABNORMAL HIGH (ref 0.0–2.0)
Bicarbonate: 11.9 mmol/L — ABNORMAL LOW (ref 20.0–28.0)
O2 Content: 2 L/min
O2 Saturation: 99.1 %
Patient temperature: 37
pCO2 arterial: 21 mmHg — ABNORMAL LOW (ref 32–48)
pH, Arterial: 7.36 (ref 7.35–7.45)
pO2, Arterial: 109 mmHg — ABNORMAL HIGH (ref 83–108)

## 2023-02-25 LAB — URINALYSIS, W/ REFLEX TO CULTURE (INFECTION SUSPECTED)
Bilirubin Urine: NEGATIVE
Glucose, UA: NEGATIVE mg/dL
Ketones, ur: NEGATIVE mg/dL
Nitrite: NEGATIVE
Protein, ur: 100 mg/dL — AB
Specific Gravity, Urine: 1.01 (ref 1.005–1.030)
WBC, UA: >50 WBC/hpf (ref 0–5)
pH: 6 (ref 5.0–8.0)

## 2023-02-25 LAB — PROTIME-INR
INR: 1.5 — ABNORMAL HIGH (ref 0.8–1.2)
Prothrombin Time: 18.7 seconds — ABNORMAL HIGH (ref 11.4–15.2)

## 2023-02-25 LAB — RESP PANEL BY RT-PCR (RSV, FLU A&B, COVID)  RVPGX2
Influenza A by PCR: NEGATIVE
Influenza B by PCR: NEGATIVE
Resp Syncytial Virus by PCR: NEGATIVE
SARS Coronavirus 2 by RT PCR: NEGATIVE

## 2023-02-25 LAB — LACTIC ACID, PLASMA
Lactic Acid, Venous: 5 mmol/L (ref 0.5–1.9)
Lactic Acid, Venous: 7 mmol/L (ref 0.5–1.9)

## 2023-02-25 LAB — CBG MONITORING, ED
Glucose-Capillary: 115 mg/dL — ABNORMAL HIGH (ref 70–99)
Glucose-Capillary: 98 mg/dL (ref 70–99)
Glucose-Capillary: 168 mg/dL — ABNORMAL HIGH (ref 70–99)

## 2023-02-25 LAB — APTT: aPTT: 22 seconds — ABNORMAL LOW (ref 24–36)

## 2023-02-25 MED ORDER — INSULIN ASPART 100 UNIT/ML IJ SOLN
10.0000 [IU] | Freq: Once | INTRAMUSCULAR | Status: AC
  Administered 2023-02-25: 10 [IU] via INTRAVENOUS
  Filled 2023-02-25: qty 1

## 2023-02-25 MED ORDER — METRONIDAZOLE 500 MG/100ML IV SOLN
500.0000 mg | Freq: Once | INTRAVENOUS | Status: AC
  Administered 2023-02-25: 500 mg via INTRAVENOUS
  Filled 2023-02-25: qty 100

## 2023-02-25 MED ORDER — POLYVINYL ALCOHOL 1.4 % OP SOLN: 1.0000 [drp] | Freq: Four times a day (QID) | OPHTHALMIC | Status: DC | PRN

## 2023-02-25 MED ORDER — SODIUM CHLORIDE 0.9 % IV SOLN: 2.0000 g | Freq: Once | INTRAVENOUS | Status: DC

## 2023-02-25 MED ORDER — LACTATED RINGERS IV BOLUS (SEPSIS)
250.0000 mL | Freq: Once | INTRAVENOUS | Status: AC
  Administered 2023-02-25: 250 mL via INTRAVENOUS

## 2023-02-25 MED ORDER — LACTATED RINGERS IV BOLUS (SEPSIS)
1000.0000 mL | Freq: Once | INTRAVENOUS | Status: AC
  Administered 2023-02-25: 1000 mL via INTRAVENOUS

## 2023-02-25 MED ORDER — GLYCOPYRROLATE 1 MG PO TABS: 1.0000 mg | ORAL_TABLET | ORAL | Status: DC | PRN

## 2023-02-25 MED ORDER — LACTATED RINGERS IV BOLUS (SEPSIS)
500.0000 mL | Freq: Once | INTRAVENOUS | Status: AC
  Administered 2023-02-25: 500 mL via INTRAVENOUS

## 2023-02-25 MED ORDER — GLYCOPYRROLATE 0.2 MG/ML IJ SOLN: 0.2000 mg | INTRAMUSCULAR | Status: DC | PRN

## 2023-02-25 MED ORDER — SODIUM CHLORIDE 0.9 % IV SOLN: INTRAVENOUS | Status: DC

## 2023-02-25 MED ORDER — DEXTROSE 50 % IV SOLN
1.0000 | Freq: Once | INTRAVENOUS | Status: AC
  Administered 2023-02-25: 50 mL via INTRAVENOUS
  Filled 2023-02-25: qty 50

## 2023-02-25 MED ORDER — LORAZEPAM 2 MG/ML IJ SOLN: 2.0000 mg | INTRAMUSCULAR | Status: DC | PRN

## 2023-02-25 MED ORDER — CALCIUM GLUCONATE 10 % IV SOLN
1.0000 g | Freq: Once | INTRAVENOUS | Status: AC
  Administered 2023-02-25: 1 g via INTRAVENOUS
  Filled 2023-02-25: qty 10

## 2023-02-25 MED ORDER — ACETAMINOPHEN 325 MG PO TABS: 650.0000 mg | ORAL_TABLET | Freq: Four times a day (QID) | ORAL | Status: DC | PRN

## 2023-02-25 MED ORDER — GLYCOPYRROLATE 0.2 MG/ML IJ SOLN
0.2000 mg | INTRAMUSCULAR | Status: DC | PRN
  Administered 2023-02-26: 0.2 mg via INTRAVENOUS
  Filled 2023-02-25 (×2): qty 1

## 2023-02-25 MED ORDER — VANCOMYCIN HCL IN DEXTROSE 1-5 GM/200ML-% IV SOLN
1000.0000 mg | Freq: Once | INTRAVENOUS | Status: AC
  Administered 2023-02-25: 1000 mg via INTRAVENOUS
  Filled 2023-02-25: qty 200

## 2023-02-25 MED ORDER — MORPHINE SULFATE (PF) 2 MG/ML IV SOLN: 2.0000 mg | INTRAVENOUS | Status: DC | PRN

## 2023-02-25 MED ORDER — LACTATED RINGERS IV SOLN: INTRAVENOUS | Status: DC

## 2023-02-25 MED ORDER — ACETAMINOPHEN 325 MG RE SUPP
650.0000 mg | Freq: Four times a day (QID) | RECTAL | Status: DC | PRN
  Administered 2023-02-26: 650 mg via RECTAL
  Filled 2023-02-25: qty 2

## 2023-02-25 MED ORDER — SODIUM CHLORIDE 0.9 % IV SOLN
2.0000 g | Freq: Once | INTRAVENOUS | Status: AC
  Administered 2023-02-25: 2 g via INTRAVENOUS
  Filled 2023-02-25: qty 12.5

## 2023-02-25 NOTE — Consult Note (Signed)
CODE SEPSIS - PHARMACY COMMUNICATION  **Broad Spectrum Antibiotics should be administered within 1 hour of Sepsis diagnosis**  Time Code Sepsis Called/Page Received: 1943  Antibiotics Ordered: vancomycin, cefepime  Time of 1st antibiotic administration: 2032  Additional action taken by pharmacy: N/A   Celene Squibb, PharmD Clinical Pharmacist 02/08/2023 7:48 PM

## 2023-02-25 NOTE — ED Provider Notes (Signed)
[facility] Provider Note    Event Date/Time   First MD Initiated Contact with Patient 03/07/2023 1936     (approximate)   History   Altered Mental Status   HPI  Meghan [name] is a 77 y.o. female  who presents to the emergency department today from living facility because of concern for altered mental status.  The patient herself is unable to give any history. Apparently found to be altered today.     Physical Exam   Triage Vital Signs: ED Triage Vitals  Encounter Vitals Group     BP 02/20/2023 1930 (!) 89/56     Systolic BP Percentile --      Diastolic BP Percentile --      Pulse Rate 02/15/2023 1930 (!) 56     Resp 02/27/2023 1930 (!) 26     Temp 02/14/2023 1935 (!) 100.9 F (38.3 C)     Temp Source 03/07/2023 1935 Axillary     SpO2 02/19/2023 1930 96 %     Weight 03/03/2023 1928 112 lb (50.8 kg)     Height 02/24/2023 1928 4\' 11"  (1.499 m)     Head Circumference --      Peak Flow --      Pain Score --      Pain Loc --      Pain Education --      Exclude from Growth Chart --     Most recent vital signs: Vitals:   03/10/2023 2234 03/09/2023 2245  BP:  106/78  Pulse:  (!) 43  Resp:  (!) 33  Temp: (!) 97.4 F (36.3 C)   SpO2:  100%   General: Somnolent, non verbal. CV:  Good peripheral perfusion. Regular rate and rhythm. Resp:  Normal effort. Lungs clear. Abd:  No distention.    ED Results / Procedures / Treatments   Labs (all labs ordered are listed, but only abnormal results are displayed) Labs Reviewed  LACTIC ACID, PLASMA - Abnormal; Notable for the following components:      Result Value   Lactic Acid, Venous 5.0 (*)    All other components within normal limits  LACTIC ACID, PLASMA - Abnormal; Notable for the following components:   Lactic Acid, Venous 7.0 (*)    All other components within normal limits  COMPREHENSIVE METABOLIC PANEL - Abnormal; Notable for the following components:   Potassium 6.9 (*)    CO2 20 (*)    Glucose, Bld 137  (*)    BUN 88 (*)    Creatinine, Ser 2.13 (*)    Calcium 10.9 (*)    Total Protein 8.3 (*)    Albumin 3.1 (*)    AST 43 (*)    GFR, Estimated 24 (*)    All other components within normal limits  CBC WITH DIFFERENTIAL/PLATELET - Abnormal; Notable for the following components:   WBC 14.1 (*)    nRBC 0.5 (*)    Neutro Abs 9.7 (*)    Abs Immature Granulocytes 1.04 (*)    All other components within normal limits  PROTIME-INR - Abnormal; Notable for the following components:   Prothrombin Time 18.7 (*)    INR 1.5 (*)    All other components within normal limits  APTT - Abnormal; Notable for the following components:   aPTT 22 (*)    All other components within normal limits  URINALYSIS, W/ REFLEX TO CULTURE (INFECTION SUSPECTED) - Abnormal; Notable for the following components:   Color, Urine  YELLOW (*)    APPearance CLOUDY (*)    Hgb urine dipstick SMALL (*)    Protein, ur 100 (*)    Leukocytes,Ua LARGE (*)    Bacteria, UA MANY (*)    All other components within normal limits  BLOOD GAS, ARTERIAL - Abnormal; Notable for the following components:   pCO2 arterial 21 (*)    pO2, Arterial 109 (*)    Bicarbonate 11.9 (*)    Acid-base deficit 11.3 (*)    All other components within normal limits  CBG MONITORING, ED - Abnormal; Notable for the following components:   Glucose-Capillary 115 (*)    All other components within normal limits  CBG MONITORING, ED - Abnormal; Notable for the following components:   Glucose-Capillary 168 (*)    All other components within normal limits  RESP PANEL BY RT-PCR (RSV, FLU A&B, COVID)  [id number]  CULTURE, BLOOD (ROUTINE X 2)  CULTURE, BLOOD (ROUTINE X 2)  URINE CULTURE  BASIC METABOLIC PANEL  LACTIC ACID, PLASMA  LACTIC ACID, PLASMA  CBG MONITORING, ED     EKG  I, Phineas Semen, attending physician, personally viewed and interpreted this EKG  EKG Time: 2003 Rate: 104 Rhythm: sinus rhythm with frequent PVCs Axis: left axis  deviation Intervals: qtc 553 QRS: LVH with IVCD, LAD ST changes: no st elevation Impression: abnormal ekg  RADIOLOGY I independently interpreted and visualized the CXR. My interpretation: No pneumonia Radiology interpretation:  IMPRESSION:  1. No active disease.     PROCEDURES:  Critical Care performed: Yes  CRITICAL CARE Performed by: Phineas Semen   Total critical care time: 40 minutes  Critical care time was exclusive of separately billable procedures and treating other patients.  Critical care was necessary to treat or prevent imminent or life-threatening deterioration.  Critical care was time spent personally by me on the following activities: development of treatment plan with patient and/or surrogate as well as nursing, discussions with consultants, evaluation of patient's response to treatment, examination of patient, obtaining history from patient or surrogate, ordering and performing treatments and interventions, ordering and review of laboratory studies, ordering and review of radiographic studies, pulse oximetry and re-evaluation of patient's condition.   Procedures    MEDICATIONS ORDERED IN ED: Medications  lactated ringers infusion (has no administration in time range)  metroNIDAZOLE (FLAGYL) IVPB 500 mg (0 mg Intravenous Stopped 02/12/2023 2136)  vancomycin (VANCOCIN) IVPB 1000 mg/200 mL premix (0 mg Intravenous Stopped 02/19/2023 2237)  lactated ringers bolus 1,000 mL (0 mLs Intravenous Stopped 02/18/2023 2227)    And  lactated ringers bolus 500 mL (0 mLs Intravenous Stopped 02/11/2023 2344)    And  lactated ringers bolus 250 mL (0 mLs Intravenous Stopped 02/28/2023 2343)  ceFEPIme (MAXIPIME) 2 g in sodium chloride 0.9 % 100 mL IVPB (0 g Intravenous Stopped 02/10/2023 2103)  calcium gluconate inj 10% (1 g) URGENT USE ONLY! (1 g Intravenous Given 02/23/2023 2105)  insulin aspart (novoLOG) injection 10 Units (10 Units Intravenous Given 03/06/2023 2102)  dextrose 50 % solution  50 mL (50 mLs Intravenous Given 02/23/2023 2100)     IMPRESSION / MDM / ASSESSMENT AND PLAN / ED COURSE  I reviewed the triage vital signs and the nursing notes.                              Differential diagnosis includes, but is not limited to, UTI, pneumonia, intraabdominal infection  Patient's presentation is most consistent  with acute presentation with potential threat to life or bodily function.   The patient is on the cardiac monitor to evaluate for evidence of arrhythmia and/or significant heart rate changes.  Patient presented to the emergency department today because of concern for altered mental status. Initial vital signs concerning for infection with fever and low blood pressure.  Broad workup was initiated.  Blood work concerning for leukocytosis and lactic acidosis.  IV fluids and broad-spectrum antibiotics were started.  Workup was consistent with a urinary tract infection.  Patient is a DNR/DNI.  On reassessment after fluids blood pressure did improve. Discussed with Dr. Allena Katz with the hospitalist service who will evaluate for admission.      FINAL CLINICAL IMPRESSION(S) / ED DIAGNOSES   Final diagnoses:  Altered mental status, unspecified altered mental status type  Lower urinary tract infectious disease  Elevated lactic acid level        Note:  This document was prepared using Dragon voice recognition software and may include unintentional dictation errors.    Phineas Semen, MD 03-15-2023 [phone]

## 2023-02-25 NOTE — Sepsis Progress Note (Signed)
Notified provider of need to order repeat lactic acid. ° °

## 2023-02-25 NOTE — H&P (Signed)
History and Physical    Patient: Meghan [name] WGN:[id number] DOB: 1945/11/06 DOA: 02/19/2023 DOS: the patient was seen and examined on 03/16/23 PCP: System, Provider Not In Cardiology: [facility].   Patient coming from: Home   Chief Complaint:  Chief Complaint  Patient presents with   Altered Mental Status    HPI: Meghan [name] is a 77 y.o. female with medical history significant for heart failure, COPD, CAD, GERD, hypertension, [employer] pacemaker presenting with altered mental status-somnolence/lethargy.  HPI unobtainable due to MS.  In ed pt is lethargic and unresponsive.  Vitals show fever . Tachycardia and low BP.  Labs shows: Hyperkalemia of 6.9/ glucose of 137 / AKI of 2.13 / calcium of 10.9. AST of 43 and ALT 28. Lactic acid of 5.0, INR of 1.5. Leucocytosis of 14.1, normal HB and platelet of 279.  Resp panel neg for influenza and RSV and Covid.  Abnormal urinalysis with large leucocytes/ >50 WBC. Chest xray : neg for any acute finding.  Pt has pacemaker for CHB and h/o syncope episode.  Patient in the emergency room asked guarded prognosis and called family and discussed with them about the same. Patient is a DO NOT RESUSCITATE with a MOST forms and family stated that they are coming to the emergency room to see her. Debbie hall is cousin for patient- spoke to ms hall and family is on way.  Pt has been confused for few weeks. Pt fell has surgery on her leg and then fell after that injured what they had operate on.  Pt was told they can't operate on her leg. Lives at [facility]. Family was not told pt is coming to ER. Ms debbie Mr. Sherrine Maples and Ms.Tammy at bedside.  D/W family about guarded and terminal prognosis and offered comfort care.  Family verbalizes understanding and would like to proceed with comfort / end of life care measures.   Review of Systems: ROS  Past Medical History:  Diagnosis Date   Anginal pain (HCC)    Anxiety    Arthritis    RA   Asthma     Brain tumor (benign) (HCC)    CHF (congestive heart failure) (HCC)    Collagen vascular disease (HCC)    Complete heart block (HCC)    COPD (chronic obstructive pulmonary disease) (HCC)    Coronary artery disease    DDD (degenerative disc disease)    Depression    Diabetes mellitus    GERD (gastroesophageal reflux disease)    Headache    Heart murmur    Hypercholesteremia    Hypertension    Lumbar degenerative disc disease    Migraines    Obesity    Osteopenia    Osteoporosis    Pacemaker- [employer]    Pneumonia    PTSD (post-traumatic stress disorder)    Past Surgical History:  Procedure Laterality Date   ABDOMINAL HYSTERECTOMY     CERVICAL FUSION     CHOLECYSTECTOMY     COLONOSCOPY WITH PROPOFOL N/A 09/20/2015   Procedure: COLONOSCOPY WITH PROPOFOL;  Surgeon: Elnita Maxwell, MD;  Location: [facility] ENDOSCOPY;  Service: Endoscopy;  Laterality: N/A;   COLONOSCOPY WITH PROPOFOL N/A 01/27/2016   Procedure: COLONOSCOPY WITH PROPOFOL;  Surgeon: Scot Jun, MD;  Location: [facility] ENDOSCOPY;  Service: Endoscopy;  Laterality: N/A;   FINGER ARTHROPLASTY  02/23/2012   Procedure: FINGER ARTHROPLASTY;  Surgeon: Wyn Forster., MD;  Location: [facility];  Service: Orthopedics;  Laterality: Left;  Extensor carpi radialis longus to Extensor carpi ulnaris transfer, left Metaphalangeal reconstructions of index and long fingers,   FOOT ARTHROPLASTY     toes x2 rt foot   HAND RECONSTRUCTION  2011   right-multiple finger joint reconst   JOINT REPLACEMENT     bilat knee replacements   KYPHOPLASTY N/A 06/14/2017   Procedure: KYPHOPLASTY L1;  Surgeon: Kennedy Bucker, MD;  Location: [facility] ORS;  Service: Orthopedics;  Laterality: N/A;   LOOP RECORDER INSERTION N/A 06/23/2017   Procedure: LOOP RECORDER INSERTION;  Surgeon: Marcina Millard, MD;  Location: [facility] INVASIVE CV LAB;  Service: Cardiovascular;  Laterality: N/A;   LOOP RECORDER REMOVAL N/A 10/21/2018   Procedure:  LOOP RECORDER REMOVAL;  Surgeon: Duke Salvia, MD;  Location: [facility] INVASIVE CV LAB;  Service: Cardiovascular;  Laterality: N/A;   OPEN REDUCTION INTERNAL FIXATION (ORIF) TIBIA/FIBULA FRACTURE Right 03/12/2022   Procedure: OPEN REDUCTION INTERNAL FIXATION (ORIF) TIBIA/FIBULA FRACTURE;  Surgeon: Juanell Fairly, MD;  Location: [facility] ORS;  Service: Orthopedics;  Laterality: Right;   ORIF FEMUR FRACTURE Right 10/22/2018   Procedure: OPEN REDUCTION INTERNAL FIXATION (ORIF) DISTAL FEMUR FRACTURE;  Surgeon: Teryl Lucy, MD;  Location: [facility] OR;  Service: Orthopedics;  Laterality: Right;   PACEMAKER IMPLANT N/A 10/21/2018   Procedure: PACEMAKER IMPLANT;  Surgeon: Duke Salvia, MD;  Location: [facility] INVASIVE CV LAB;  Service: Cardiovascular;  Laterality: N/A;   Social History:  reports that she has never smoked. She has never used smokeless tobacco. She reports that she does not drink alcohol and does not use drugs.  Allergies  Allergen Reactions   Diphenhydramine Other (See Comments)    Makes her "pass out" and lightheaded   Gabapentin Other (See Comments)    Pt states that it causes her BP to drop.    Iodine Other (See Comments)    Reaction:  Syncopy   Naproxen Hives   Nsaids Other (See Comments)    Reaction:  Unknown   Tolmetin Other (See Comments)    Reaction:  Unknown    Amoxicillin Itching and Other (See Comments)    Tolerates cephalosporins, tolerated Zosyn 02/22/2017    Family History  Problem Relation Age of Onset   Depression Sister    Migraines Sister    Hypertension Mother    Arthritis/Rheumatoid Mother    Heart attack Father    Hypertension Father    CAD Other    Hypertension Other    Diabetes Mellitus II Other    Arthritis Other     Prior to Admission medications   Medication Sig Start Date End Date Taking? Authorizing Provider  acetaminophen (TYLENOL) 500 MG tablet Take 2 tablets (1,000 mg total) by mouth every 8 (eight) hours as needed for headache, fever, moderate pain  or mild pain. 03/16/22   Lynn Ito, MD  albuterol (VENTOLIN HFA) 108 (90 Base) MCG/ACT inhaler Inhale 1-2 puffs into the lungs every 6 (six) hours as needed for wheezing or shortness of breath.    [provider]  budesonide (PULMICORT) 0.5 MG/2ML nebulizer solution Inhale 2 mLs into the lungs daily. 11/29/18   [provider]  busPIRone (BUSPAR) 10 MG tablet Take 10 mg by mouth 2 (two) times daily. 12/28/21   [provider]  Calcium Carb-Cholecalciferol (CALCIUM 500/D) 500-400 MG-UNIT CHEW Chew by mouth daily.    [provider]  calcium carbonate (OS-CAL - DOSED IN MG OF ELEMENTAL CALCIUM) 1250 (500 Ca) MG tablet Take 2 tablets by mouth every 2 (two) hours as needed. 01/14/22  [provider]  carboxymethylcellul-glycerin (REFRESH OPTIVE) 0.5-0.9 % ophthalmic solution Place 1 drop into both eyes at bedtime.    [provider]  Cholecalciferol 125 MCG (5000 UT) capsule Take 5,000 Units by mouth daily.    [provider]  diclofenac sodium (VOLTAREN) 1 % GEL Apply 2 g topically 3 (three) times daily. Apply to the fingers    [provider]  docusate sodium (COLACE) 100 MG capsule Take 1 capsule (100 mg total) by mouth 2 (two) times daily as needed for mild constipation. Patient not taking: Reported on 06/01/2022 03/16/22   Lynn Ito, MD  enoxaparin (LOVENOX) 40 MG/0.4ML injection Inject 0.4 mLs (40 mg total) into the skin daily. 03/17/22   Lynn Ito, MD  ergocalciferol (VITAMIN D2) 1.25 MG (50000 UT) capsule Take 1 capsule (50,000 Units total) by mouth 2 (two) times a week. X 6 weeks. 01/12/22 08/11/22  Delano Metz, MD  escitalopram (LEXAPRO) 20 MG tablet Take 20 mg by mouth daily. 12/09/21   [provider]  famotidine (PEPCID) 40 MG tablet Take 40 mg by mouth daily. 12/02/21   [provider]  ferrous sulfate 325 (65 FE) MG tablet Take 325 mg by mouth daily with breakfast.    [provider]   furosemide (LASIX) 40 MG tablet Take 1 tablet (40 mg total) by mouth daily. 03/17/22   Lynn Ito, MD  hydrOXYzine (ATARAX) 25 MG tablet Take 25 mg by mouth 3 (three) times daily as needed (pruitus).    [provider]  lamoTRIgine (LAMICTAL) 100 MG tablet Take 1 tablet (100 mg total) by mouth daily. 06/02/16   Brandy Hale, MD  loratadine (CLARITIN) 10 MG tablet Take 10 mg by mouth daily.    [provider]  LYRICA 100 MG capsule Take 100 mg by mouth 2 (two) times daily. 12/17/21   [provider]  mirtazapine (REMERON) 15 MG tablet Take 15 mg by mouth at bedtime. 02/10/22   [provider]  [facility] powder Apply 1 Application topically 2 (two) times daily. 02/05/22   [provider]  Polyethyl Glycol-Propyl Glycol 0.4-0.3 % SOLN Place 1 drop into both eyes at bedtime.    [provider]  polyethylene glycol (MIRALAX / GLYCOLAX) 17 g packet Take 17 g by mouth daily as needed for mild constipation. 03/16/22   Lynn Ito, MD  Potassium Chloride ER 20 MEQ TBCR Take 20 mEq by mouth daily. 03/17/22   Lynn Ito, MD  pramipexole (MIRAPEX) 0.25 MG tablet Take 0.25 mg by mouth daily.     [provider]  pravastatin (PRAVACHOL) 20 MG tablet Take 1 tablet by mouth 3 (three) times a week. Monday, Wednesday, and Friday 12/06/19   [provider]  Tofacitinib Citrate (XELJANZ) 5 MG TABS Take 1 tablet by mouth 2 (two) times daily. 03/03/21   [provider]  topiramate (TOPAMAX) 25 MG tablet Take 25 mg by mouth at bedtime.     [provider]     Vitals:   02/15/2023 2245 03/04/2023 2315 02/20/2023 2330 03-07-23 0000  BP: 106/78 127/88 105/82 96/64  Pulse: (!) 43 82 (!) 46 97  Resp: (!) 33 (!) 23 (!) 23 (!) 28  Temp:      TempSrc:      SpO2: 100% 100% 97% 100%  Weight:      Height:       Physical Exam Vitals reviewed.  Constitutional:      General: She is in acute distress.  Appearance: She is toxic-appearing.   HENT:     Head: Normocephalic.     Right Ear: External ear normal.     Left Ear: External ear normal.     Nose: Nose normal.     Mouth/Throat:     Mouth: Mucous membranes are dry.  Eyes:     Extraocular Movements: Extraocular movements intact.  Cardiovascular:     Rate and Rhythm: Regular rhythm. Tachycardia present.     Pulses:          Dorsalis pedis pulses are 0 on the right side and 0 on the left side.       Posterior tibial pulses are 0 on the right side and 0 on the left side.  Pulmonary:     Effort: Respiratory distress present.     Breath sounds: Rales present. No wheezing.  Abdominal:     General: There is no distension.  Musculoskeletal:     Right lower leg: No edema.     Left lower leg: No edema.  Skin:    Capillary Refill: Capillary refill takes more than 3 seconds.  Neurological:     Mental Status: She is unresponsive.     GCS: GCS eye subscore is 2.    Labs on Admission: I have personally reviewed following labs and imaging studies  CBC: Recent Labs  Lab 02/13/2023 1943  WBC 14.1*  NEUTROABS 9.7*  HGB 14.3  HCT 45.8  MCV 96.2  PLT 279   Basic Metabolic Panel: Recent Labs  Lab 03/10/2023 1943 03/09/2023 2345  NA 139 141  K 6.9* 5.8*  CL 105 110  CO2 20* 13*  GLUCOSE 137* 124*  BUN 88* 84*  CREATININE 2.13* 2.10*  CALCIUM 10.9* 10.5*   GFR: Estimated Creatinine Clearance: 15.5 mL/min (A) (by C-G formula based on SCr of 2.1 mg/dL (H)). Liver Function Tests: Recent Labs  Lab 02/12/2023 1943  AST 43*  ALT 28  ALKPHOS 106  BILITOT 0.9  PROT 8.3*  ALBUMIN 3.1*   No results for input(s): "LIPASE", "AMYLASE" in the last 168 hours. No results for input(s): "AMMONIA" in the last 168 hours. Coagulation Profile: Recent Labs  Lab 02/10/2023 1943  INR 1.5*   Cardiac Enzymes: No results for input(s): "CKTOTAL", "CKMB", "CKMBINDEX", "TROPONINI" in the last 168 hours. BNP (last 3 results) No results for input(s): "PROBNP" in the last 8760  hours. HbA1C: No results for input(s): "HGBA1C" in the last 72 hours. CBG: Recent Labs  Lab 02/11/2023 2059 02/28/2023 2121 03/09/2023 2225  GLUCAP 115* 98 168*   Lipid Profile: No results for input(s): "CHOL", "HDL", "LDLCALC", "TRIG", "CHOLHDL", "LDLDIRECT" in the last 72 hours. Thyroid Function Tests: No results for input(s): "TSH", "T4TOTAL", "FREET4", "T3FREE", "THYROIDAB" in the last 72 hours. Anemia Panel: No results for input(s): "VITAMINB12", "FOLATE", "FERRITIN", "TIBC", "IRON", "RETICCTPCT" in the last 72 hours. Urine analysis: Urinalysis    Component Value Date/Time   COLORURINE YELLOW (A) 02/15/2023 1943   APPEARANCEUR CLOUDY (A) 02/24/2023 1943   APPEARANCEUR Turbid (A) 05/05/2016 1010   LABSPEC 1.010 02/22/2023 1943   PHURINE 6.0 02/21/2023 1943   GLUCOSEU NEGATIVE 02/19/2023 1943   HGBUR SMALL (A) 03/09/2023 1943   BILIRUBINUR NEGATIVE 02/27/2023 1943   BILIRUBINUR Negative 05/05/2016 1010   KETONESUR NEGATIVE 02/19/2023 1943   PROTEINUR 100 (A) 03/02/2023 1943   NITRITE NEGATIVE 02/24/2023 1943   LEUKOCYTESUR LARGE (A) 02/14/2023 1943    Unresulted Labs (From admission, onward)     Start  Ordered   03/01/2023 2329  Lactic acid, plasma  STAT Now then every 3 hours,   STAT      02/14/2023 2328   02/09/2023 1943  Blood Culture (routine x 2)  (Septic presentation on arrival (screening labs, nursing and treatment orders for obvious sepsis))  BLOOD CULTURE X 2,   STAT      02/22/2023 1943   02/11/2023 1943  Urine Culture  Once,   R        02/27/2023 1943           Radiological Exams on Admission: DG Chest Port 1 View  Result Date: 02/21/2023 CLINICAL DATA:  Sepsis EXAM: PORTABLE CHEST 1 VIEW COMPARISON:  12/29/2022 FINDINGS: 4 stable elevation of the right hemidiaphragm. Lungs are clear. No pneumothorax or pleural effusion. Cardiac size within normal limits. Left subclavian dual lead pacemaker is unchanged. Pulmonary vascularity is normal. No acute bone abnormality.  Degenerative changes are seen. Superimposed thoracolumbar scoliosis. L1 vertebroplasty has been performed. IMPRESSION: 1. No active disease. Electronically Signed   By: Helyn Numbers M.D.   On: 02/25/2023 20:42     Data Reviewed: Relevant notes from primary care and specialist visits, past discharge summaries as available in EHR, including Care Everywhere. Prior diagnostic testing as pertinent to current admission diagnoses Updated medications and problem lists for reconciliation ED course, including vitals, labs, imaging, treatment and response to treatment Triage notes, nursing and pharmacy notes and ED provider's notes Notable results as noted in HPI.  Assessment and Plan: * AMS (altered mental status) Patient is a 77 year old 77 year old female presents today with altered mental status unresponsiveness and respiratory distress. On nasal cannula meeting sepsis criteria.  Suspect patient may have aspirated however chest x-ray is negative. Differentials also include pulmonary embolism.  Patient's current state in the emergency room is guarded and prognosis is terminal.  Patient was received IV fluids and her pressure still intermittently is low.  Patient's lactic acid is still elevated and rising. In order for patient to be treated she would need NIPPV and possibly ventilator support.  After discussing with family informing them about her current condition and respiratory failure and hypotension and shock.  Family agrees with comfort measure. Patient does gasping for air and is having air hunger.  Rales are audible at bedside.  Discussed with family that she most likely has aspirated and unfortunately is not doing well and will most evaluate tonight.  Offered comfort measures and they were happy with plan of care for treatment hunger and pain relief.      Acute respiratory failure with hypoxia (HCC) SpO2: 100 % Pt on salsa cannula with air hunger.  PRN morphine ordered along with  glycopyrrolate.    Comfort measures only status Patient presenting with apparent sepsis, respiratory failure -After  discussion in the ER at [facility] , family has decided to proceed with comfort care only -Pt admitted for for comfort care.  -However, her reserve is likely quite low given her age and overall frailty -Comfort care order set utilized -No antibiotics or IVF as per discussion with family -Pain control with morphine as needed, would transition to a drip if needed but patient does not appear to be in pain at this time     DVT prophylaxis: None - comfort measures Code Status: DNR - confirmed with family Family Communication: Family present throughout evaluation Disposition Plan: Anticipate in-hospital death Consults called:  None  Admission status: Admit - It is my clinical opinion that admission to INPATIENT is  reasonable and necessary because of the expectation that this patient will require hospital care that crosses at least 2 midnights to treat this condition based on the medical complexity of the problems presented.  Given the aforementioned information, the predictability of an adverse outcome is felt to be significant.  Author: Gertha Calkin, MD 03-21-23 2:46 AM  For on call review [email or web address].

## 2023-02-25 NOTE — Sepsis Progress Note (Signed)
Elink monitoring for the code sepsis protocol.  

## 2023-02-25 NOTE — Consult Note (Signed)
PHARMACY -  BRIEF ANTIBIOTIC NOTE   Pharmacy has received consult(s) for vancomycin, aztreonam from an ED provider.  The patient's profile has been reviewed for ht/wt/allergies/indication/available labs.    One time order(s) placed for vancomycin 1000 mg, cefepime 2g (as patient has tolerated multiple cephalosporins since time of reported amoxicillin allergy)  Further antibiotics/pharmacy consults should be ordered by admitting physician if indicated.                       Thank you,  Celene Squibb, PharmD Clinical Pharmacist 02/21/2023 7:51 PM

## 2023-02-26 DIAGNOSIS — A419 Sepsis, unspecified organism: Secondary | ICD-10-CM | POA: Diagnosis present

## 2023-02-26 DIAGNOSIS — G9349 Other encephalopathy: Secondary | ICD-10-CM | POA: Diagnosis present

## 2023-02-26 DIAGNOSIS — F419 Anxiety disorder, unspecified: Secondary | ICD-10-CM | POA: Diagnosis present

## 2023-02-26 DIAGNOSIS — E119 Type 2 diabetes mellitus without complications: Secondary | ICD-10-CM | POA: Diagnosis present

## 2023-02-26 DIAGNOSIS — Z1152 Encounter for screening for COVID-19: Secondary | ICD-10-CM | POA: Diagnosis not present

## 2023-02-26 DIAGNOSIS — M069 Rheumatoid arthritis, unspecified: Secondary | ICD-10-CM | POA: Diagnosis present

## 2023-02-26 DIAGNOSIS — M1990 Unspecified osteoarthritis, unspecified site: Secondary | ICD-10-CM | POA: Diagnosis present

## 2023-02-26 DIAGNOSIS — E7800 Pure hypercholesterolemia, unspecified: Secondary | ICD-10-CM | POA: Diagnosis present

## 2023-02-26 DIAGNOSIS — R4182 Altered mental status, unspecified: Secondary | ICD-10-CM | POA: Diagnosis present

## 2023-02-26 DIAGNOSIS — K219 Gastro-esophageal reflux disease without esophagitis: Secondary | ICD-10-CM | POA: Diagnosis present

## 2023-02-26 DIAGNOSIS — Z515 Encounter for palliative care: Secondary | ICD-10-CM | POA: Diagnosis not present

## 2023-02-26 DIAGNOSIS — E669 Obesity, unspecified: Secondary | ICD-10-CM | POA: Diagnosis present

## 2023-02-26 DIAGNOSIS — F4310 Post-traumatic stress disorder, unspecified: Secondary | ICD-10-CM | POA: Diagnosis present

## 2023-02-26 DIAGNOSIS — R54 Age-related physical debility: Secondary | ICD-10-CM | POA: Diagnosis present

## 2023-02-26 DIAGNOSIS — J4489 Other specified chronic obstructive pulmonary disease: Secondary | ICD-10-CM | POA: Diagnosis present

## 2023-02-26 DIAGNOSIS — I442 Atrioventricular block, complete: Secondary | ICD-10-CM | POA: Diagnosis present

## 2023-02-26 DIAGNOSIS — E872 Acidosis, unspecified: Secondary | ICD-10-CM | POA: Diagnosis present

## 2023-02-26 DIAGNOSIS — N390 Urinary tract infection, site not specified: Secondary | ICD-10-CM | POA: Diagnosis present

## 2023-02-26 DIAGNOSIS — F32A Depression, unspecified: Secondary | ICD-10-CM | POA: Diagnosis present

## 2023-02-26 DIAGNOSIS — J9601 Acute respiratory failure with hypoxia: Secondary | ICD-10-CM | POA: Diagnosis present

## 2023-02-26 DIAGNOSIS — R6521 Severe sepsis with septic shock: Secondary | ICD-10-CM | POA: Diagnosis present

## 2023-02-26 DIAGNOSIS — M810 Age-related osteoporosis without current pathological fracture: Secondary | ICD-10-CM | POA: Diagnosis present

## 2023-02-26 DIAGNOSIS — N179 Acute kidney failure, unspecified: Secondary | ICD-10-CM | POA: Diagnosis present

## 2023-02-26 DIAGNOSIS — I10 Essential (primary) hypertension: Secondary | ICD-10-CM | POA: Diagnosis present

## 2023-02-26 DIAGNOSIS — I998 Other disorder of circulatory system: Secondary | ICD-10-CM | POA: Diagnosis present

## 2023-02-26 DIAGNOSIS — Z66 Do not resuscitate: Secondary | ICD-10-CM | POA: Diagnosis present

## 2023-02-26 LAB — LACTIC ACID, PLASMA: Lactic Acid, Venous: >9 mmol/L (ref 0.5–1.9)

## 2023-02-26 LAB — BASIC METABOLIC PANEL
Anion gap: 18 — ABNORMAL HIGH (ref 5–15)
BUN: 84 mg/dL — ABNORMAL HIGH (ref 8–23)
CO2: 13 mmol/L — ABNORMAL LOW (ref 22–32)
Calcium: 10.5 mg/dL — ABNORMAL HIGH (ref 8.9–10.3)
Chloride: 110 mmol/L (ref 98–111)
Creatinine, Ser: 2.1 mg/dL — ABNORMAL HIGH (ref 0.44–1.00)
GFR, Estimated: 24 mL/min — ABNORMAL LOW (ref >60)
Glucose, Bld: 124 mg/dL — ABNORMAL HIGH (ref 70–99)
Potassium: 5.8 mmol/L — ABNORMAL HIGH (ref 3.5–5.1)
Sodium: 141 mmol/L (ref 135–145)

## 2023-02-26 LAB — BLOOD CULTURE ID PANEL (REFLEXED) - BCID2

## 2023-02-26 LAB — CULTURE, BLOOD (ROUTINE X 2)

## 2023-02-27 LAB — URINE CULTURE: Culture: >=100000 — AB

## 2023-02-27 LAB — CULTURE, BLOOD (ROUTINE X 2)

## 2023-02-28 LAB — URINE CULTURE

## 2023-02-28 LAB — CULTURE, BLOOD (ROUTINE X 2)
Special Requests: ADEQUATE
Special Requests: ADEQUATE

## 2023-03-11 NOTE — Assessment & Plan Note (Signed)
SpO2: 100 % Pt on salsa cannula with air hunger.  PRN morphine ordered along with glycopyrrolate.

## 2023-03-11 NOTE — ED Notes (Addendum)
Pt time of death at 0600 pronounced by Delorise Shiner, RN and Gerlene Burdock, Charity fundraiser. Family at bedside and updated. Dr. Para March notified. Medical Examiner contacted and patient noted to be a medical examiner case.

## 2023-03-11 NOTE — ED Notes (Signed)
Patient transferred into hospital bed. Provided comfort measures to patient and removed telemetry monitoring.

## 2023-03-11 NOTE — Assessment & Plan Note (Addendum)
Patient is a 77 year old 76 year old female presents today with altered mental status unresponsiveness and respiratory distress. On nasal cannula meeting sepsis criteria.  Suspect patient may have aspirated however chest x-ray is negative. Differentials also include pulmonary embolism.  Patient's current state in the emergency room is guarded and prognosis is terminal.  Patient was received IV fluids and her pressure still intermittently is low.  Patient's lactic acid is still elevated and rising. In order for patient to be treated she would need NIPPV and possibly ventilator support.  After discussing with family informing them about her current condition and respiratory failure and hypotension and shock.  Family agrees with comfort measure. Patient does gasping for air and is having air hunger.  Rales are audible at bedside.  Discussed with family that she most likely has aspirated and unfortunately is not doing well and will most evaluate tonight.  Offered comfort measures and they were happy with plan of care for treatment hunger and pain relief.

## 2023-03-11 NOTE — Assessment & Plan Note (Addendum)
Patient presenting with apparent sepsis, respiratory failure -After  discussion in the ER at [facility] , family has decided to proceed with comfort care only -Pt admitted for for comfort care.  -However, her reserve is likely quite low given her age and overall frailty -Comfort care order set utilized -No antibiotics or IVF as per discussion with family -Pain control with morphine as needed, would transition to a drip if needed but patient does not appear to be in pain at this time

## 2023-03-11 DEATH — deceased
